# Patient Record
Sex: Female | Born: 1970
Health system: Southern US, Community
[De-identification: ages and names within clinical notes are randomized; demographics above are authoritative.]

## PROBLEM LIST (undated history)

## (undated) DIAGNOSIS — Z9012 Acquired absence of left breast and nipple: Secondary | ICD-10-CM

## (undated) DIAGNOSIS — R7303 Prediabetes: Secondary | ICD-10-CM

## (undated) DIAGNOSIS — R000 Tachycardia, unspecified: Secondary | ICD-10-CM

## (undated) DIAGNOSIS — B009 Herpesviral infection, unspecified: Secondary | ICD-10-CM

## (undated) DIAGNOSIS — D649 Anemia, unspecified: Secondary | ICD-10-CM

## (undated) DIAGNOSIS — G629 Polyneuropathy, unspecified: Secondary | ICD-10-CM

## (undated) DIAGNOSIS — C50912 Malignant neoplasm of unspecified site of left female breast: Secondary | ICD-10-CM

## (undated) DIAGNOSIS — J309 Allergic rhinitis, unspecified: Secondary | ICD-10-CM

## (undated) DIAGNOSIS — Z9221 Personal history of antineoplastic chemotherapy: Secondary | ICD-10-CM

## (undated) DIAGNOSIS — Z923 Personal history of irradiation: Secondary | ICD-10-CM

## (undated) DIAGNOSIS — R7301 Impaired fasting glucose: Secondary | ICD-10-CM

## (undated) DIAGNOSIS — E0590 Thyrotoxicosis, unspecified without thyrotoxic crisis or storm: Secondary | ICD-10-CM

## (undated) DIAGNOSIS — C50919 Malignant neoplasm of unspecified site of unspecified female breast: Secondary | ICD-10-CM

## (undated) DIAGNOSIS — D4959 Neoplasm of unspecified behavior of other genitourinary organ: Secondary | ICD-10-CM

## (undated) DIAGNOSIS — I10 Essential (primary) hypertension: Secondary | ICD-10-CM

## (undated) DIAGNOSIS — M179 Osteoarthritis of knee, unspecified: Secondary | ICD-10-CM

## (undated) DIAGNOSIS — N393 Stress incontinence (female) (male): Secondary | ICD-10-CM

## (undated) DIAGNOSIS — Z6841 Body Mass Index (BMI) 40.0 and over, adult: Secondary | ICD-10-CM

## (undated) DIAGNOSIS — I878 Other specified disorders of veins: Secondary | ICD-10-CM

## (undated) DIAGNOSIS — E559 Vitamin D deficiency, unspecified: Secondary | ICD-10-CM

## (undated) DIAGNOSIS — C773 Secondary and unspecified malignant neoplasm of axilla and upper limb lymph nodes: Secondary | ICD-10-CM

## (undated) DIAGNOSIS — R600 Localized edema: Secondary | ICD-10-CM

## (undated) DIAGNOSIS — K219 Gastro-esophageal reflux disease without esophagitis: Secondary | ICD-10-CM

## (undated) DIAGNOSIS — J302 Other seasonal allergic rhinitis: Secondary | ICD-10-CM

## (undated) DIAGNOSIS — E785 Hyperlipidemia, unspecified: Secondary | ICD-10-CM

## (undated) DIAGNOSIS — N952 Postmenopausal atrophic vaginitis: Secondary | ICD-10-CM

## (undated) DIAGNOSIS — R0789 Other chest pain: Secondary | ICD-10-CM

## (undated) DIAGNOSIS — L03115 Cellulitis of right lower limb: Secondary | ICD-10-CM

## (undated) DIAGNOSIS — R7309 Other abnormal glucose: Secondary | ICD-10-CM

## (undated) DIAGNOSIS — R232 Flushing: Secondary | ICD-10-CM

## (undated) DIAGNOSIS — K9041 Non-celiac gluten sensitivity: Secondary | ICD-10-CM

## (undated) HISTORY — DX: Essential (primary) hypertension: I10

## (undated) HISTORY — DX: Flushing: R23.2

## (undated) HISTORY — DX: Osteoarthritis of knee, unspecified: M17.9

## (undated) HISTORY — DX: Herpesviral infection, unspecified: B00.9

## (undated) HISTORY — DX: Postmenopausal atrophic vaginitis: N95.2

## (undated) HISTORY — DX: Body Mass Index (BMI) 40.0 and over, adult: Z684

## (undated) HISTORY — DX: Neoplasm of unspecified behavior of other genitourinary organ: D49.59

## (undated) HISTORY — DX: Hyperlipidemia, unspecified: E78.5

## (undated) HISTORY — DX: Allergic rhinitis, unspecified: J30.9

## (undated) HISTORY — DX: Thyrotoxicosis, unspecified without thyrotoxic crisis or storm: E05.90

## (undated) HISTORY — DX: Polyneuropathy, unspecified: G62.9

## (undated) HISTORY — DX: Other specified disorders of veins: I87.8

## (undated) HISTORY — DX: Morbid (severe) obesity due to excess calories: E66.01

## (undated) HISTORY — DX: Other chest pain: R07.89

## (undated) HISTORY — DX: Cellulitis of right lower limb: L03.115

## (undated) HISTORY — PX: REDUCTION MAMMAPLASTY: SUR839

## (undated) HISTORY — DX: Impaired fasting glucose: R73.01

## (undated) HISTORY — DX: Stress incontinence (female) (male): N39.3

## (undated) HISTORY — DX: Vitamin D deficiency, unspecified: E55.9

## (undated) HISTORY — DX: Localized edema: R60.0

## (undated) HISTORY — DX: Other abnormal glucose: R73.09

## (undated) HISTORY — DX: Malignant neoplasm of unspecified site of left female breast: C50.912

## (undated) HISTORY — DX: Acquired absence of left breast and nipple: Z90.12

## (undated) HISTORY — DX: Non-celiac gluten sensitivity: K90.41

## (undated) HISTORY — DX: Tachycardia, unspecified: R00.0

## (undated) HISTORY — DX: Gastro-esophageal reflux disease without esophagitis: K21.9

## (undated) HISTORY — DX: Secondary and unspecified malignant neoplasm of axilla and upper limb lymph nodes: C77.3

---
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
## 1997-08-30 ENCOUNTER — Ambulatory Visit (HOSPITAL_COMMUNITY): Admission: RE | Admit: 1997-08-30 | Discharge: 1997-08-30 | Payer: Self-pay | Admitting: Obstetrics and Gynecology

## 1997-10-11 ENCOUNTER — Other Ambulatory Visit: Admission: RE | Admit: 1997-10-11 | Discharge: 1997-10-11 | Payer: Self-pay | Admitting: Obstetrics & Gynecology

## 1998-03-19 ENCOUNTER — Other Ambulatory Visit: Admission: RE | Admit: 1998-03-19 | Discharge: 1998-03-19 | Payer: Self-pay | Admitting: Obstetrics & Gynecology

## 1998-09-26 ENCOUNTER — Other Ambulatory Visit: Admission: RE | Admit: 1998-09-26 | Discharge: 1998-09-26 | Payer: Self-pay | Admitting: *Deleted

## 1999-09-05 ENCOUNTER — Other Ambulatory Visit: Admission: RE | Admit: 1999-09-05 | Discharge: 1999-09-05 | Payer: Self-pay | Admitting: Obstetrics & Gynecology

## 1999-12-04 ENCOUNTER — Ambulatory Visit (HOSPITAL_COMMUNITY): Admission: RE | Admit: 1999-12-04 | Discharge: 1999-12-04 | Payer: Self-pay | Admitting: Internal Medicine

## 1999-12-04 ENCOUNTER — Encounter: Payer: Self-pay | Admitting: Internal Medicine

## 2000-09-28 ENCOUNTER — Emergency Department (HOSPITAL_COMMUNITY): Admission: EM | Admit: 2000-09-28 | Discharge: 2000-09-28 | Payer: Self-pay | Admitting: Emergency Medicine

## 2000-10-15 ENCOUNTER — Other Ambulatory Visit: Admission: RE | Admit: 2000-10-15 | Discharge: 2000-10-15 | Payer: Self-pay | Admitting: Obstetrics and Gynecology

## 2001-01-06 ENCOUNTER — Other Ambulatory Visit: Admission: RE | Admit: 2001-01-06 | Discharge: 2001-01-06 | Payer: Self-pay | Admitting: Obstetrics and Gynecology

## 2001-09-29 ENCOUNTER — Encounter: Payer: Self-pay | Admitting: Internal Medicine

## 2001-09-29 ENCOUNTER — Encounter: Admission: RE | Admit: 2001-09-29 | Discharge: 2001-09-29 | Payer: Self-pay | Admitting: Internal Medicine

## 2001-10-21 ENCOUNTER — Ambulatory Visit (HOSPITAL_COMMUNITY): Admission: RE | Admit: 2001-10-21 | Discharge: 2001-10-21 | Payer: Self-pay | Admitting: Podiatry

## 2001-10-21 ENCOUNTER — Encounter: Payer: Self-pay | Admitting: Podiatry

## 2002-02-10 ENCOUNTER — Other Ambulatory Visit: Admission: RE | Admit: 2002-02-10 | Discharge: 2002-02-10 | Payer: Self-pay | Admitting: Obstetrics and Gynecology

## 2002-04-07 ENCOUNTER — Ambulatory Visit (HOSPITAL_COMMUNITY): Admission: RE | Admit: 2002-04-07 | Discharge: 2002-04-07 | Payer: Self-pay | Admitting: Obstetrics and Gynecology

## 2002-04-07 ENCOUNTER — Encounter: Payer: Self-pay | Admitting: Obstetrics and Gynecology

## 2002-08-26 ENCOUNTER — Inpatient Hospital Stay (HOSPITAL_COMMUNITY): Admission: AD | Admit: 2002-08-26 | Discharge: 2002-08-26 | Payer: Self-pay | Admitting: Obstetrics and Gynecology

## 2002-08-30 ENCOUNTER — Encounter: Payer: Self-pay | Admitting: Obstetrics and Gynecology

## 2002-08-30 ENCOUNTER — Inpatient Hospital Stay (HOSPITAL_COMMUNITY): Admission: AD | Admit: 2002-08-30 | Discharge: 2002-08-30 | Payer: Self-pay | Admitting: Obstetrics and Gynecology

## 2002-08-30 ENCOUNTER — Observation Stay (HOSPITAL_COMMUNITY): Admission: AD | Admit: 2002-08-30 | Discharge: 2002-08-30 | Payer: Self-pay | Admitting: Obstetrics and Gynecology

## 2002-09-01 ENCOUNTER — Encounter (HOSPITAL_COMMUNITY): Admission: RE | Admit: 2002-09-01 | Discharge: 2002-09-01 | Payer: Self-pay | Admitting: Obstetrics and Gynecology

## 2002-09-09 ENCOUNTER — Inpatient Hospital Stay (HOSPITAL_COMMUNITY): Admission: AD | Admit: 2002-09-09 | Discharge: 2002-09-09 | Payer: Self-pay | Admitting: Obstetrics and Gynecology

## 2002-09-09 ENCOUNTER — Encounter: Payer: Self-pay | Admitting: Obstetrics and Gynecology

## 2002-09-11 ENCOUNTER — Encounter (INDEPENDENT_AMBULATORY_CARE_PROVIDER_SITE_OTHER): Payer: Self-pay | Admitting: Specialist

## 2002-09-11 ENCOUNTER — Inpatient Hospital Stay (HOSPITAL_COMMUNITY): Admission: AD | Admit: 2002-09-11 | Discharge: 2002-09-14 | Payer: Self-pay | Admitting: Obstetrics and Gynecology

## 2003-04-03 ENCOUNTER — Other Ambulatory Visit: Admission: RE | Admit: 2003-04-03 | Discharge: 2003-04-03 | Payer: Self-pay | Admitting: Obstetrics and Gynecology

## 2004-05-07 ENCOUNTER — Other Ambulatory Visit: Admission: RE | Admit: 2004-05-07 | Discharge: 2004-05-07 | Payer: Self-pay | Admitting: Obstetrics and Gynecology

## 2005-05-06 ENCOUNTER — Other Ambulatory Visit: Admission: RE | Admit: 2005-05-06 | Discharge: 2005-05-06 | Payer: Self-pay | Admitting: Obstetrics and Gynecology

## 2007-03-13 ENCOUNTER — Emergency Department (HOSPITAL_COMMUNITY): Admission: EM | Admit: 2007-03-13 | Discharge: 2007-03-13 | Payer: Self-pay | Admitting: Emergency Medicine

## 2009-07-12 ENCOUNTER — Encounter: Admission: RE | Admit: 2009-07-12 | Discharge: 2009-07-12 | Payer: Self-pay | Admitting: Internal Medicine

## 2010-06-09 DIAGNOSIS — J309 Allergic rhinitis, unspecified: Secondary | ICD-10-CM

## 2010-06-09 DIAGNOSIS — J302 Other seasonal allergic rhinitis: Secondary | ICD-10-CM

## 2010-06-09 HISTORY — DX: Allergic rhinitis, unspecified: J30.9

## 2010-06-09 HISTORY — DX: Other seasonal allergic rhinitis: J30.2

## 2010-07-12 LAB — HEPATITIS B SURFACE ANTIGEN: Hepatitis B Surface Ag: NEGATIVE

## 2010-07-12 LAB — TYPE AND SCREEN: Antibody Screen: NEGATIVE

## 2010-07-12 LAB — RUBELLA ANTIBODY, IGM: Rubella: IMMUNE

## 2010-10-25 NOTE — H&P (Signed)
NAME:  Mackenzie [name], Mackenzie [name]                       ACCOUNT NO.:  [id number]   MEDICAL RECORD NO.:  [id number]                   PATIENT TYPE:  INP   LOCATION:  9147                                 FACILITY:  [facility]   PHYSICIAN:  Naima A. Dillard, M.D.              DATE OF BIRTH:  1971/01/09   DATE OF ADMISSION:  09/11/2002  DATE OF DISCHARGE:                                HISTORY & PHYSICAL   HISTORY OF PRESENT ILLNESS:  The patient is a 40 year old, married, black  female, gravida 5, para 2-0-2-2, at 40-6/7 weeks by ultrasound, who presents  complaining of uterine contractions every three to five minutes for the last  several hours.  She denies any leaking or vaginal bleeding.  She reports  positive fetal movement.  She denies any nausea, vomiting, headache, or  visual disturbances.  Her pregnancy has been followed at [facility] by the certified nurse midwife service and has been essentially  uncomplicated, though at risk for:  1.  A history of questionable LMP.  2.  History of previous cesarean section with a subsequent vaginal birth after  cesarean section and an original interest in this delivery being a VBAC  also.  3.  History of obesity.  4.  Positive group B Streptococcus.  5.  One  elevated value on her three-hour GTT.  6.  History of abnormal PAP and  cryosurgery.  She last had a meal to eat around 3 p.m. and had a cookie at  11:30 p.m.   OBSTETRICAL/GYNECOLOGICAL HISTORY:  She is a gravida 5, para 2-0-2-2, with a  questionable LMP of around the end of June of 2003 and an [facility] established by  ultrasound of September 05, 2002.  She had an elective AB in 1991 with no  complications and also in 1993 with no complications.  In February of 1995,  she delivered a viable female infant weighing 7 pounds 12 ounces at [redacted] weeks  gestation.  She did have a C-section for that delivery secondary to  nonreassuring fetal heart rate after an attempted induction.  In September  of 1996,  she had another female infant that weighed 7 pounds 14 ounces at [redacted]  weeks gestation following a 17-hour labor.  She did deliver vaginally,  though required forceps for delivery.   ALLERGIES:  She has no known drug allergies.   PAST MEDICAL HISTORY:  She reports having the usual childhood diseases.  She  has a history of occasional urinary tract infections and a history of  gallstones.  Her only other hospitalizations were for childbirth.   PAST SURGICAL HISTORY:  Her only surgery was a C-section.   FAMILY HISTORY:  Significant for maternal grandfather with an MI, heart  disease, and chronic hypertension, maternal grandmother with insulin-  dependent diabetes and now deceased, paternal grandmother with breast  cancer, and maternal grandmother with a CVA.   GENETIC HISTORY:  Significant for  a daughter who is deaf in one ear of  unknown and the patient's cousin had twins.   SOCIAL HISTORY:  She is married to [facility], who is involved and  supportive.  He is employed full-time as an Biomedical scientist.  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. [employer].  They are  of the [employer] [facility] faith.  They deny any illicit drug use, alcohol, or smoking  with this pregnancy.   PRENATAL LABORATORY DATA:  Her blood type is O+.  Her antibody screen is  negative.  Sickle cell trait is negative.  Syphilis is nonreactive.  Rubella  is immune.  Hepatitis B surface antigen is negative.  HIV is nonreactive.  Her Pap is within normal limits.  Gonorrhea and chlamydia are both negative.  Her maternal serum alpha fetoprotein was within normal range.  Her one-hour  Glucola was elevated at 150 and her three-hour GTT was all normal with the  exception of one value.  She has had a normal growth ultrasound at 37 weeks.  Her 36-week beta strep is positive.  She also reports a normal biophysical  profile on September 09, 2002.   PHYSICAL EXAMINATION:  VITAL SIGNS:  Stable.  She is afebrile.   HEENT:  Grossly within normal limits.  HEART:  Regular rate and rhythm.  CHEST:  Clear.  BREASTS:  Soft and nontender.  ABDOMEN:  Gravid with uterine contractions originally every three to five  minutes.  Now after some IV fluid, they are every 4-10 minutes.  Her fetal  heart rate originally had a baseline of 155-165 with some late decelerations  noted with the uterine contractions, however, after IV fluid bolus, the late  decelerations have resolved and the fetal heart rate has reactivity and is  reassuring.  PELVIC:  Her cervix is closed, 70%, vertex out of the pelvis.  The vertex is  confirmed by bedside ultrasound.  EXTREMITIES:  Within normal limits.   ASSESSMENT:  1. Intrauterine pregnancy at 40-6/7 weeks.  2. History of previous low transverse cesarean section.  3. Unfavorable cervix.  4. Positive group B Streptococcus.   PLAN:  After a long discussion with the patient regarding her options of  observation, induction, and cesarean section and the risks and benefits of  each being reviewed, the patient has decided to proceed with a repeat  cesarean section.  Naima A. Dillard, M.D., has been notified of the  patient's status and decision and agrees to proceed.  It was scheduled to  performed a cesarean section around 6 a.m. today if the fetal heart rate  remains stable.     Concha Pyo. Duplantis, C.N.M.              Naima A. Normand Sloop, M.D.    SJD/MEDQ  D:  09/11/2002  T:  09/11/2002  Job:  [id number]

## 2010-10-25 NOTE — Op Note (Signed)
NAME:  Mackenzie [name], Mackenzie [name]                       ACCOUNT NO.:  [id number]   MEDICAL RECORD NO.:  [id number]                   PATIENT TYPE:  INP   LOCATION:  9147                                 [facility]ILITY:  [facility]   PHYSICIAN:  Naima A. Dillard, M.D.              DATE OF BIRTH:  1970/10/30   DATE OF PROCEDURE:  09/11/2002  DATE OF DISCHARGE:                                 OPERATIVE REPORT   PREOPERATIVE DIAGNOSES:  1. Pregnancy at 40-6/7 weeks with contractions.  2. Desires repeat cesarean section.   POSTOPERATIVE DIAGNOSES:  1. Pregnancy at 40-6/7 weeks with contractions.  2. Desires repeat cesarean section.  3. Macrosomia.   PROCEDURE:  Repeat cesarean section.   SURGEON:  Naima A. Normand Sloop, M.D.   ASSISTANT:  Concha Pyo. Duplantis, C.N.M.   ANESTHESIA:  Spinal.   ESTIMATED BLOOD LOSS:  800 mL.   INTRAVENOUS FLUIDS:  2000 mL crystalloid.   URINE OUTPUT:  400 mL.   COMPLICATIONS:  None.   FINDINGS:  Female infant in the vertex presentation with clear fluid.  Weight of 10 pounds, 15 ounces, Apgars are 9 and 9, born at 58.  Normal  uterus, tubes and ovaries bilaterally.   DESCRIPTION OF PROCEDURE:  The patient was taken to the operating room where  she was given spinal anesthesia, placed in the dorsal supine position with a  left lateral tilt, prepped and draped, Foley catheter placed.  Anesthesia  was found to be adequate and a Pfannenstiel skin incision was then made with  the scalpel. It was carried down to the fascia using Bovie cautery.  The  fascia was incised in the midline and extended bilaterally.  Kochers x2 were  used to elevate the fascia off of the rectus muscles both superiorly and  inferiorly and also with the help of the Bovie cautery.  The rectus muscles  were separated in the midline.  The peritoneum was identified, tented up  with sharply with Metzenbaum scissors.  The bladder blade was inserted.  The  vesicouterine peritoneum was identified, tented up  and entered sharply and  the bladder flap was created digitally.  The bladder blade was reinserted.  A lower transverse uterine incision was made with the scalpel and extended  using Mayo scissors.  The sac was ruptured with clear fluid.  The infant was  delivered using normal maneuvers.  The placenta was manually extracted.  There was a little bit of adherence but of the placental membranes to the  lower uterine segment.  Banjo curet was then used to curet the membranes.  A  dry towel was placed in the uterine cavity.  The uterus was cleared of all  clot and debris.  The uterine cervix was opened with my finger.  My glove  was then changed.  The uterine incisions were repaired with 0 Vicryl in a  running locked fashion.  A second layer of imbrication of 0 Vicryl  was used.  Irrigation was done.  There was still some bleeding seen on the left side of  the uterine incision which was repaired with 0 Vicryl in a running locked  fashion.  Hemostasis was assured.  Both tubes and ovaries were seen and  noted to be normal.  The uterus appeared to be normal.  The peritoneum was  closed with 0 chromic.  The fascia was closed with 0 Vicryl in a running  fashion.  JP drain was placed into the subcutaneous fascia.  The skin was  closed with staples.  Sponge, lap and needle counts were correct x2.  The  patient returned to the recovery room in stable condition.                                               Naima A. Normand Sloop, M.D.    NAD/MEDQ  D:  09/11/2002  T:  09/11/2002  Job:  [id number]

## 2010-10-25 NOTE — Discharge Summary (Signed)
   NAME:  Mackenzie [name], Mackenzie [name]                       ACCOUNT NO.:  [id number]   MEDICAL RECORD NO.:  [id number]                   PATIENT TYPE:  INP   LOCATION:  9147                                 [facility]   PHYSICIAN:  Naima A. Dillard, M.D.              DATE OF BIRTH:  [birth year]   DATE OF ADMISSION:  09/11/2002  DATE OF DISCHARGE:  09/14/2002                                 DISCHARGE SUMMARY   ADMISSION DIAGNOSES:  1. Intrauterine pregnancy at term.  2. Uterine contractions.  3. Desires repeat cesarean section.   PROCEDURE:  Repeat low-transverse cesarean section.   POSTOPERATIVE DIAGNOSES:  1. Intrauterine pregnancy at term.  2. Uterine contractions.  3. Desires repeat cesarean section.   HOSPITAL COURSE:  The patient is a 40 year old gravida 5, para 2-0-2-2 who  presents at 40-6/7 weeks with uterine contractions every 3 to 5 minutes.  Her cervix remained unfavorable, despite contractions.  She had a history of  a previous C-section with subsequent VBAC, although the VBAC required  forceps for delivery.  Then, at this time, on admission, she desired repeat  cesarean section.  She underwent this procedure by Dr. Jaymes Graff with  the birth of a 10-pound-15-ounce female infant named Mackenzie [name] with Apgar  scores at 9 at 1 minute, 9 at 5 minutes.  Both mother and infant have done  well in the immediate postoperative period.  Her hemoglobin on the first  postoperative day was 8.4.  She did not have any dizziness, and her vital  signs have remained stable.  She is judged to be in satisfactory condition  on this postop day three.   DISCHARGE INSTRUCTIONS:  Per [facility] doctor.   DISCHARGE MEDICATIONS:  1. Motrin 600 mg p.o. q.6h. p.r.n. pain.  2. Tylox, number 2, p.o. q.3-4h. pain.  3. Prenatal vitamins.   DISCHARGE FOLLOW UP:  Will be at [facility] in six weeks.       Rica Koyanagi, C.N.M.               Naima A. Normand Sloop, M.D.    SDM/MEDQ  D:   09/14/2002  T:  09/14/2002  Job:  [id number]

## 2010-12-04 ENCOUNTER — Encounter: Payer: Self-pay | Attending: Obstetrics and Gynecology

## 2010-12-04 DIAGNOSIS — Z713 Dietary counseling and surveillance: Secondary | ICD-10-CM | POA: Insufficient documentation

## 2010-12-04 DIAGNOSIS — O99810 Abnormal glucose complicating pregnancy: Secondary | ICD-10-CM | POA: Insufficient documentation

## 2011-01-06 ENCOUNTER — Other Ambulatory Visit: Payer: Self-pay | Admitting: Obstetrics and Gynecology

## 2011-01-07 ENCOUNTER — Other Ambulatory Visit: Payer: Self-pay | Admitting: Obstetrics and Gynecology

## 2011-01-14 ENCOUNTER — Encounter (HOSPITAL_COMMUNITY): Payer: Self-pay

## 2011-01-15 ENCOUNTER — Encounter (HOSPITAL_COMMUNITY): Payer: Self-pay

## 2011-01-15 ENCOUNTER — Encounter (HOSPITAL_COMMUNITY)
Admission: RE | Admit: 2011-01-15 | Discharge: 2011-01-15 | Disposition: A | Discharge status: Home / Self Care | Payer: 59 | Source: Ambulatory Visit | Attending: Obstetrics and Gynecology | Admitting: Obstetrics and Gynecology

## 2011-01-15 ENCOUNTER — Encounter (HOSPITAL_COMMUNITY): Payer: Self-pay | Admitting: Obstetrics and Gynecology

## 2011-01-15 HISTORY — DX: Anemia, unspecified: D64.9

## 2011-01-15 LAB — BASIC METABOLIC PANEL
Glucose, Bld: 107 mg/dL — ABNORMAL HIGH (ref 70–99)
Potassium: 3.8 mEq/L (ref 3.5–5.1)

## 2011-01-15 LAB — RPR: RPR Ser Ql: NONREACTIVE

## 2011-01-15 LAB — CBC
Hemoglobin: 11.2 g/dL — ABNORMAL LOW (ref 12.0–15.0)
MCH: 28.4 pg (ref 26.0–34.0)
MCV: 85.6 fL (ref 78.0–100.0)
Platelets: 221 10*3/uL (ref 150–400)
RDW: 13.8 % (ref 11.5–15.5)

## 2011-01-15 NOTE — Patient Instructions (Addendum)
20 Amie C Dahm  01/15/2011   Your procedure is scheduled on:  01/21/11  Report to [facility] at 9 AM.  Call this number if you have problems the morning of surgery: [phone]   Remember:   Do not eat food:After Midnight.  Do not drink clear liquids: 4 Hours before arrival.  Take these medicines the morning of surgery with A SIP OF WATER: NA   Do not wear jewelry, make-up or nail polish.  Do not wear lotions, powders, or perfumes. You may wear deodorant.  Do not shave 48 hours prior to surgery.  Do not bring valuables to the hospital.  Contacts, dentures or bridgework may not be worn into surgery.  Leave suitcase in the car. After surgery it may be brought to your room.  For patients admitted to the hospital, checkout time is 11:00 AM the day of discharge.   Patients discharged the day of surgery will not be allowed to drive home.  Name and phone number of your driver: NA  Special Instructions: Use CHG wash per written instruction sheet   Please read over the following fact sheets that you were given: MRSA Information

## 2011-01-20 MED ORDER — CEFAZOLIN SODIUM-DEXTROSE 2-3 GM-% IV SOLR
2.0000 g | INTRAVENOUS | Status: DC
  Filled 2011-01-20: qty 50

## 2011-01-20 NOTE — H&P (Signed)
NAMESHANAI, LARTIGUE             ACCOUNT NO.:  [id number]  MEDICAL RECORD NO.:  [id number]  LOCATION:                                 FACILITY:  PHYSICIAN:  Naima A. Dillard, M.D. DATE OF BIRTH:  May 18, 1971  DATE OF ADMISSION:  01/21/2011 DATE OF DISCHARGE:                             HISTORY & PHYSICAL   Ms. Malecki is a 40 year old gravida 6, para 3-0-2-3 who presents at 73- 4/7 weeks for scheduled repeat cesarean section with bilateral tubal ligation.  Her pregnancy has been remarkable for; 1. Previous C-section x2 with desire for repeat and tubal. 2. History of LGA infant. 3. Gestational diabetes, insulin-dependent. 4. Elevated BMI. 5. Group B strep positive. 6. Advanced maternal age.  PRENATAL LABS:  Blood type is O+, Rh antibody negative, VDRL nonreactive, rubella titer positive, hepatitis B surface antigen negative, HIV was nonreactive.  GC and Chlamydia cultures were negative in the first trimester.  Pap was normal in February 2012.  Urinalysis was also normal at that time.  Hemoglobin upon entering the practice was 12.1.  It was 10.6 at 29 weeks.  The patient had a first trimester screen that was normal.  She also had an AFP that was normal.  She had a 1-hour Glucola at 17 weeks of 120.  She then had another Glucola at 29 weeks that was 152.  She also at that time had nonreactive RPR and hemoglobin of 10.6.  She had a 3-hour GTT showing 3/4 abnormal values. Beta strep was positive at 36 weeks.  HISTORY OF PRESENT PREGNANCY:  The patient entered care at approximately 10 weeks.  She had a first trimester screen and a normal AFP.  She was planning a repeat C-section from the beginning of her pregnancy.  She had some dependent edema by 14 weeks.  AFP was normal.  Anatomy scan was done at 20 weeks which adjusted her EDC to January 24, 2011, this also was in agreement with her 11-week ultrasound.  She had an elevated 1- hour Glucola at 29 weeks and 3 out of 4 of normal  values on her 3-hour. She therefore was sent to nutritional management.  Soon after that her sugars by 34 weeks were noted to be elevated.  She was started on Humulin at that time nightly 8 units.  She continued to monitor her blood sugar and by 35 weeks added 4 units of NPH in the morning.  She also had implementation of weekly biophysical profile.  Beta strep was positive at 36 weeks.  She had an ultrasound at 35 weeks which showed growth at the 83rd percentile and fluid at 75th percentile.  She then had weekly BPPs that were normal by her visit on August 9.  Fasting blood sugars had been in the 100-106, 2-hour postprandial have been 89- 129.  She had another ultrasound done at 38-6/7 weeks showing normal fluid, BPP of 8/8 in a vertex presentation.  She then was scheduled for C-section on January 21, 2011, with tubal ligation by Dr. Normand Sloop.  OBSTETRICAL HISTORY:  In 46 and again in 1992 or 1993, she had a TAB. In 1995, she had a primary low transverse cesarean section for a female infant,  weight 7 pounds 12 ounces at 42 weeks.  That C-section was done for an attempted induction for oligohydramnios that then showed low fetal heart rate.  She essentially did not labor much at all and was taken for C-section.  She then in 1996 had a forceps delivery of a female infant, weight 7 pounds 4 ounces at 40 weeks.  She was in labor 17 hours.  This was a failed vacuum and then a forceps delivery.  She then in 2004 had a repeat C-section for a female infant, weight 10 pounds 11 ounces at 42 weeks without complication.  In addition to that pregnancy remarkable for the patient is also an Charity fundraiser.  GYNECOLOGICAL HISTORY:  The patient denies any STDs.  She has had normal cycles.  MEDICAL HISTORY:  She reports usual childhood illnesses.  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  ALLERGIES:  She has no known medication allergies.  FAMILY HISTORY:  Maternal grandfather is hypertensive.   Maternal grandmother has insulin-dependent diabetes.  Her mother had a benign brain tumor.  Her paternal grandmother had breast cancer.  Her only surgery was the C-sections in 1995 and 2004.  Genetic history is remarkable for the third child having extra digits on the hand.  SOCIAL HISTORY:  The patient is married to the father of baby.  He is involved and supportive.  His name is Naylah Cork.  The patient is a Designer, jewellery at [employer].  Her husband also has some college. He is employed in an Nurse, learning disability.  She denies religious affiliation.  She has been followed by [facility].  She denies any alcohol, drug or tobacco use during this pregnancy.  PHYSICAL EXAMINATION:  VITAL SIGNS:  Stable.  The patient is febrile. HEENT:  Within normal limits. LUNGS:  Breath sounds are clear. HEART:  Regular rate and rhythm without murmur. BREASTS:  Soft and nontender. ABDOMEN:  Fundal height is approximately 41-42 cm.  Estimated fetal weight is 8 to 8-1/2 pounds. PELVIC:  Uterine contractions are none.  Fetal heart rate has been in the 140s by Doppler in the office. EXTREMITIES:  Deep tendon reflexes are 2+ without clonus.  There is +1 edema noted. PELVIC:  Deferred.  IMPRESSION: 1. Intrauterine pregnancy at 39-4/7 weeks. 2. Previous cesarean section x2 with desire for repeat. 3. Desires tubal sterilization. 4. Gestational diabetes, insulin-dependent. 5. Group B strep positive. 6. History of large for gestational age infant.  PLAN: 1. Admit to [facility] [place] per consult with Dr. Jaymes Graff as attending physician. 2. Routine physician preoperative orders. 3. Dr. Normand Sloop will make plans for blood sugar monitoring as     indicated.    Renaldo Reel Emilee Hero, C.N.M.   ______________________________ Pierre Bali Normand Sloop, M.D.   Leeanne Mannan  D:  01/20/2011  T:  01/20/2011  Job:  [id number]

## 2011-01-21 ENCOUNTER — Encounter (HOSPITAL_COMMUNITY): Payer: Self-pay | Admitting: Anesthesiology

## 2011-01-21 ENCOUNTER — Other Ambulatory Visit: Payer: Self-pay | Admitting: Obstetrics and Gynecology

## 2011-01-21 ENCOUNTER — Encounter (HOSPITAL_COMMUNITY): Payer: Self-pay | Admitting: Surgery

## 2011-01-21 ENCOUNTER — Inpatient Hospital Stay (HOSPITAL_COMMUNITY)
Admission: RE | Admit: 2011-01-21 | Discharge: 2011-01-24 | DRG: 766 | Disposition: A | Discharge status: Home / Self Care | Payer: 59 | Source: Ambulatory Visit | Attending: Obstetrics and Gynecology | Admitting: Obstetrics and Gynecology

## 2011-01-21 ENCOUNTER — Inpatient Hospital Stay (HOSPITAL_COMMUNITY): Payer: 59

## 2011-01-21 ENCOUNTER — Inpatient Hospital Stay (HOSPITAL_COMMUNITY): Payer: 59 | Admitting: Anesthesiology

## 2011-01-21 ENCOUNTER — Encounter (HOSPITAL_COMMUNITY)
Admission: RE | Disposition: A | Discharge status: Home / Self Care | Payer: Self-pay | Source: Ambulatory Visit | Attending: Obstetrics and Gynecology

## 2011-01-21 DIAGNOSIS — IMO0002 Reserved for concepts with insufficient information to code with codable children: Secondary | ICD-10-CM

## 2011-01-21 DIAGNOSIS — Z01818 Encounter for other preprocedural examination: Secondary | ICD-10-CM

## 2011-01-21 DIAGNOSIS — O34219 Maternal care for unspecified type scar from previous cesarean delivery: Principal | ICD-10-CM | POA: Diagnosis present

## 2011-01-21 DIAGNOSIS — Z01812 Encounter for preprocedural laboratory examination: Secondary | ICD-10-CM

## 2011-01-21 DIAGNOSIS — Z302 Encounter for sterilization: Secondary | ICD-10-CM

## 2011-01-21 DIAGNOSIS — O99814 Abnormal glucose complicating childbirth: Secondary | ICD-10-CM | POA: Diagnosis present

## 2011-01-21 HISTORY — PX: TUBAL LIGATION: SHX77

## 2011-01-21 LAB — GLUCOSE, CAPILLARY
Glucose-Capillary: 92 mg/dL (ref 70–99)
Glucose-Capillary: 70 mg/dL (ref 70–99)

## 2011-01-21 LAB — TYPE AND SCREEN: Antibody Screen: NEGATIVE

## 2011-01-21 SURGERY — Surgical Case
Anesthesia: Spinal | Site: Abdomen | Laterality: Bilateral | Wound class: Clean Contaminated

## 2011-01-21 MED ORDER — NALOXONE HCL 0.4 MG/ML IJ SOLN: 0.4000 mg | INTRAMUSCULAR | Status: DC | PRN

## 2011-01-21 MED ORDER — BUPIVACAINE HCL 0.75 % IJ SOLN
INTRAMUSCULAR | Status: DC | PRN
  Administered 2011-01-21: 11 mg via INTRATHECAL

## 2011-01-21 MED ORDER — MEPERIDINE HCL 25 MG/ML IJ SOLN: 6.2500 mg | INTRAMUSCULAR | Status: DC | PRN

## 2011-01-21 MED ORDER — DIPHENHYDRAMINE HCL 25 MG PO CAPS
25.0000 mg | ORAL_CAPSULE | ORAL | Status: DC | PRN
  Administered 2011-01-21 – 2011-01-22 (×4): 25 mg via ORAL
  Filled 2011-01-21 (×2): qty 1

## 2011-01-21 MED ORDER — METOCLOPRAMIDE HCL 5 MG/ML IJ SOLN: 10.0000 mg | Freq: Three times a day (TID) | INTRAMUSCULAR | Status: DC | PRN

## 2011-01-21 MED ORDER — NALBUPHINE HCL 10 MG/ML IJ SOLN
5.0000 mg | INTRAMUSCULAR | Status: DC | PRN
  Filled 2011-01-21 (×2): qty 1

## 2011-01-21 MED ORDER — FLEET ENEMA 7-19 GM/118ML RE ENEM: 1.0000 | ENEMA | RECTAL | Status: DC | PRN

## 2011-01-21 MED ORDER — MEASLES, MUMPS & RUBELLA VAC ~~LOC~~ INJ: 0.5000 mL | INJECTION | Freq: Once | SUBCUTANEOUS | Status: DC

## 2011-01-21 MED ORDER — KETOROLAC TROMETHAMINE 30 MG/ML IJ SOLN: 30.0000 mg | Freq: Four times a day (QID) | INTRAMUSCULAR | Status: AC | PRN

## 2011-01-21 MED ORDER — SODIUM CHLORIDE 0.9 % IJ SOLN: 3.0000 mL | INTRAMUSCULAR | Status: DC | PRN

## 2011-01-21 MED ORDER — LACTATED RINGERS IV SOLN
INTRAVENOUS | Status: DC
  Administered 2011-01-21 (×4): via INTRAVENOUS

## 2011-01-21 MED ORDER — DIPHENHYDRAMINE HCL 50 MG/ML IJ SOLN: 25.0000 mg | INTRAMUSCULAR | Status: DC | PRN

## 2011-01-21 MED ORDER — OXYTOCIN 10 UNIT/ML IJ SOLN: 10.0000 [IU] | INTRAVENOUS | Status: DC | PRN

## 2011-01-21 MED ORDER — PRENATAL PLUS 27-1 MG PO TABS
1.0000 | ORAL_TABLET | Freq: Every day | ORAL | Status: DC
  Administered 2011-01-22 – 2011-01-24 (×4): 1 via ORAL
  Filled 2011-01-21 (×3): qty 1

## 2011-01-21 MED ORDER — ONDANSETRON HCL 4 MG/2ML IJ SOLN
INTRAMUSCULAR | Status: DC | PRN
  Administered 2011-01-21: 4 mg via INTRAVENOUS

## 2011-01-21 MED ORDER — BISACODYL 10 MG RE SUPP: 10.0000 mg | Freq: Every day | RECTAL | Status: DC | PRN

## 2011-01-21 MED ORDER — ZOLPIDEM TARTRATE 5 MG PO TABS: 5.0000 mg | ORAL_TABLET | Freq: Every evening | ORAL | Status: DC | PRN

## 2011-01-21 MED ORDER — DIBUCAINE 1 % RE OINT: 1.0000 "application " | TOPICAL_OINTMENT | RECTAL | Status: DC | PRN

## 2011-01-21 MED ORDER — KETOROLAC TROMETHAMINE 60 MG/2ML IM SOLN
60.0000 mg | Freq: Once | INTRAMUSCULAR | Status: AC | PRN
  Administered 2011-01-21: 60 mg via INTRAMUSCULAR

## 2011-01-21 MED ORDER — KETOROLAC TROMETHAMINE 60 MG/2ML IM SOLN
INTRAMUSCULAR | Status: AC
  Filled 2011-01-21: qty 2

## 2011-01-21 MED ORDER — IBUPROFEN 600 MG PO TABS
600.0000 mg | ORAL_TABLET | Freq: Four times a day (QID) | ORAL | Status: DC | PRN
  Filled 2011-01-21 (×7): qty 1

## 2011-01-21 MED ORDER — ONDANSETRON HCL 4 MG PO TABS: 4.0000 mg | ORAL_TABLET | ORAL | Status: DC | PRN

## 2011-01-21 MED ORDER — SCOPOLAMINE 1 MG/3DAYS TD PT72
1.0000 | MEDICATED_PATCH | Freq: Once | TRANSDERMAL | Status: DC
  Administered 2011-01-21: 1.5 mg via TRANSDERMAL

## 2011-01-21 MED ORDER — DIPHENHYDRAMINE HCL 50 MG/ML IJ SOLN: 12.5000 mg | INTRAMUSCULAR | Status: DC | PRN

## 2011-01-21 MED ORDER — SIMETHICONE 80 MG PO CHEW
80.0000 mg | CHEWABLE_TABLET | Freq: Three times a day (TID) | ORAL | Status: DC
  Administered 2011-01-21 – 2011-01-24 (×11): 80 mg via ORAL

## 2011-01-21 MED ORDER — FENTANYL CITRATE 0.05 MG/ML IJ SOLN
INTRAMUSCULAR | Status: DC | PRN
  Administered 2011-01-21: 25 ug via INTRAVENOUS

## 2011-01-21 MED ORDER — OXYCODONE-ACETAMINOPHEN 5-325 MG PO TABS
1.0000 | ORAL_TABLET | ORAL | Status: DC | PRN
  Administered 2011-01-22: 2 via ORAL
  Administered 2011-01-22: 1 via ORAL
  Administered 2011-01-22 – 2011-01-24 (×5): 2 via ORAL
  Filled 2011-01-21 (×3): qty 2
  Filled 2011-01-21: qty 1
  Filled 2011-01-21: qty 2
  Filled 2011-01-21: qty 1
  Filled 2011-01-21: qty 2

## 2011-01-21 MED ORDER — SENNOSIDES-DOCUSATE SODIUM 8.6-50 MG PO TABS
2.0000 | ORAL_TABLET | Freq: Every day | ORAL | Status: DC
  Administered 2011-01-21 – 2011-01-23 (×3): 2 via ORAL

## 2011-01-21 MED ORDER — INSULIN ASPART 100 UNIT/ML ~~LOC~~ SOLN
4.0000 [IU] | SUBCUTANEOUS | Status: DC | PRN
  Filled 2011-01-21: qty 3

## 2011-01-21 MED ORDER — LACTATED RINGERS IV SOLN
INTRAVENOUS | Status: DC
  Administered 2011-01-21: 22:00:00 via INTRAVENOUS

## 2011-01-21 MED ORDER — FERROUS SULFATE 325 (65 FE) MG PO TABS
325.0000 mg | ORAL_TABLET | Freq: Every day | ORAL | Status: DC
  Administered 2011-01-22 – 2011-01-24 (×4): 325 mg via ORAL
  Filled 2011-01-21 (×3): qty 1

## 2011-01-21 MED ORDER — LORATADINE 10 MG PO TABS
10.0000 mg | ORAL_TABLET | Freq: Every day | ORAL | Status: DC
  Administered 2011-01-22 – 2011-01-24 (×4): 10 mg via ORAL
  Filled 2011-01-21 (×5): qty 1

## 2011-01-21 MED ORDER — ONDANSETRON HCL 4 MG/2ML IJ SOLN: 4.0000 mg | INTRAMUSCULAR | Status: DC | PRN

## 2011-01-21 MED ORDER — WITCH HAZEL-GLYCERIN EX PADS: 1.0000 "application " | MEDICATED_PAD | CUTANEOUS | Status: DC | PRN

## 2011-01-21 MED ORDER — FENTANYL CITRATE 0.05 MG/ML IJ SOLN: 25.0000 ug | INTRAMUSCULAR | Status: DC | PRN

## 2011-01-21 MED ORDER — LANOLIN HYDROUS EX OINT: 1.0000 "application " | TOPICAL_OINTMENT | CUTANEOUS | Status: DC | PRN

## 2011-01-21 MED ORDER — OXYTOCIN 20 UNITS IN LACTATED RINGERS INFUSION - SIMPLE
INTRAVENOUS | Status: DC | PRN
  Administered 2011-01-21: 20 [IU] via INTRAVENOUS
  Administered 2011-01-21: 10 [IU] via INTRAVENOUS

## 2011-01-21 MED ORDER — SODIUM CHLORIDE 0.9 % IV SOLN
1.0000 ug/kg/h | INTRAVENOUS | Status: DC | PRN
  Filled 2011-01-21: qty 2.5

## 2011-01-21 MED ORDER — TETANUS-DIPHTH-ACELL PERTUSSIS 5-2.5-18.5 LF-MCG/0.5 IM SUSP: 0.5000 mL | Freq: Once | INTRAMUSCULAR | Status: DC

## 2011-01-21 MED ORDER — IBUPROFEN 600 MG PO TABS
600.0000 mg | ORAL_TABLET | Freq: Four times a day (QID) | ORAL | Status: DC
  Administered 2011-01-22 – 2011-01-24 (×11): 600 mg via ORAL
  Filled 2011-01-21 (×3): qty 1

## 2011-01-21 MED ORDER — NALBUPHINE HCL 10 MG/ML IJ SOLN
5.0000 mg | INTRAMUSCULAR | Status: DC | PRN
  Administered 2011-01-21: 5 mg via INTRAVENOUS
  Filled 2011-01-21: qty 1

## 2011-01-21 MED ORDER — DIPHENHYDRAMINE HCL 25 MG PO CAPS
25.0000 mg | ORAL_CAPSULE | Freq: Four times a day (QID) | ORAL | Status: DC | PRN
  Filled 2011-01-21 (×2): qty 1

## 2011-01-21 MED ORDER — ONDANSETRON HCL 4 MG/2ML IJ SOLN: 4.0000 mg | Freq: Once | INTRAMUSCULAR | Status: DC | PRN

## 2011-01-21 MED ORDER — MONTELUKAST SODIUM 10 MG PO TABS
10.0000 mg | ORAL_TABLET | Freq: Every day | ORAL | Status: DC
  Administered 2011-01-21 – 2011-01-23 (×3): 10 mg via ORAL
  Filled 2011-01-21 (×4): qty 1

## 2011-01-21 MED ORDER — MORPHINE SULFATE (PF) 0.5 MG/ML IJ SOLN
INTRAMUSCULAR | Status: DC | PRN
  Administered 2011-01-21: 100 ug via EPIDURAL

## 2011-01-21 MED ORDER — ONDANSETRON HCL 4 MG/2ML IJ SOLN: 4.0000 mg | Freq: Three times a day (TID) | INTRAMUSCULAR | Status: DC | PRN

## 2011-01-21 MED ORDER — CEFAZOLIN SODIUM 1-5 GM-% IV SOLN
INTRAVENOUS | Status: DC | PRN
  Administered 2011-01-21: 2 g via INTRAVENOUS

## 2011-01-21 MED ORDER — SIMETHICONE 80 MG PO CHEW
80.0000 mg | CHEWABLE_TABLET | ORAL | Status: DC | PRN
  Administered 2011-01-23: 80 mg via ORAL

## 2011-01-21 MED ORDER — MENTHOL 3 MG MT LOZG
1.0000 | LOZENGE | OROMUCOSAL | Status: DC | PRN
  Administered 2011-01-21 – 2011-01-23 (×3): 3 mg via ORAL
  Filled 2011-01-21 (×3): qty 9

## 2011-01-21 SURGICAL SUPPLY — 37 items
APL SKNCLS STERI-STRIP NONHPOA (GAUZE/BANDAGES/DRESSINGS) ×1
BENZOIN TINCTURE PRP APPL 2/3 (GAUZE/BANDAGES/DRESSINGS) ×2 IMPLANT
CLOTH BEACON ORANGE TIMEOUT ST (SAFETY) ×2 IMPLANT
CONTAINER PREFILL 10% NBF 15ML (MISCELLANEOUS) ×2 IMPLANT
DRAIN JACKSON PRT FLT 10 (DRAIN) ×1 IMPLANT
DRESSING TELFA 8X3 (GAUZE/BANDAGES/DRESSINGS) ×1 IMPLANT
ELECT REM PT RETURN 9FT ADLT (ELECTROSURGICAL) ×2
ELECTRODE REM PT RTRN 9FT ADLT (ELECTROSURGICAL) ×1 IMPLANT
EVACUATOR SILICONE 100CC (DRAIN) ×1 IMPLANT
EXTRACTOR VACUUM M CUP 4 TUBE (SUCTIONS) IMPLANT
GLOVE BIO SURGEON STRL SZ 6.5 (GLOVE) ×2 IMPLANT
GLOVE BIOGEL PI IND STRL 7.0 (GLOVE) ×1 IMPLANT
GLOVE BIOGEL PI INDICATOR 7.0 (GLOVE) ×1
GOWN PREVENTION PLUS LG XLONG (DISPOSABLE) ×6 IMPLANT
KIT ABG SYR 3ML LUER SLIP (SYRINGE) IMPLANT
NDL HYPO 25X5/8 SAFETYGLIDE (NEEDLE) IMPLANT
NEEDLE HYPO 25X5/8 SAFETYGLIDE (NEEDLE) IMPLANT
NS IRRIG 1000ML POUR BTL (IV SOLUTION) ×2 IMPLANT
PACK C SECTION WH (CUSTOM PROCEDURE TRAY) ×2 IMPLANT
PAD ABD 5X9 TENDERSORB (GAUZE/BANDAGES/DRESSINGS) ×1 IMPLANT
RTRCTR C-SECT PINK 25CM LRG (MISCELLANEOUS) ×1 IMPLANT
SLEEVE SCD COMPRESS KNEE MED (MISCELLANEOUS) IMPLANT
SPONGE GAUZE 4X4 12PLY (GAUZE/BANDAGES/DRESSINGS) ×1 IMPLANT
STAPLER VISISTAT 35W (STAPLE) IMPLANT
STRIP CLOSURE SKIN 1/2X4 (GAUZE/BANDAGES/DRESSINGS) ×2 IMPLANT
SUT CHROMIC 0 CT 1 (SUTURE) ×2 IMPLANT
SUT MNCRL AB 3-0 PS2 27 (SUTURE) IMPLANT
SUT PLAIN 2 0 (SUTURE) ×4
SUT PLAIN 2 0 XLH (SUTURE) ×2 IMPLANT
SUT PLAIN ABS 2-0 CT1 27XMFL (SUTURE) ×2 IMPLANT
SUT SILK 2 0 SH (SUTURE) ×2 IMPLANT
SUT VIC AB 0 CTX 36 (SUTURE) ×8
SUT VIC AB 0 CTX36XBRD ANBCTRL (SUTURE) ×4 IMPLANT
TAPE CLOTH SURG 4X10 WHT LF (GAUZE/BANDAGES/DRESSINGS) ×1 IMPLANT
TOWEL OR 17X24 6PK STRL BLUE (TOWEL DISPOSABLE) ×4 IMPLANT
TRAY FOLEY CATH 14FR (SET/KITS/TRAYS/PACK) ×2 IMPLANT
WATER STERILE IRR 1000ML POUR (IV SOLUTION) ×2 IMPLANT

## 2011-01-21 NOTE — Transfer of Care (Signed)
Immediate Anesthesia Transfer of Care Note  Patient: Mackenzie [name]  Procedure(s) Performed:  CESAREAN SECTION WITH BILATERAL TUBAL LIGATION - Repeat  Patient Location: PACU  Anesthesia Type: Spinal  Level of Consciousness: awake, alert  and oriented  Airway & Oxygen Therapy: Patient Spontanous Breathing  Post-op Assessment: Report given to PACU RN  Post vital signs: Reviewed and stable  Complications: No apparent anesthesia complications

## 2011-01-21 NOTE — Anesthesia Preprocedure Evaluation (Signed)
Anesthesia Evaluation  Name, MR# and DOB Patient awake  General Assessment Comment  Reviewed: Allergy & Precautions, H&P , NPO status , Patient's Chart, lab work & pertinent test results  Airway Mallampati: III TM Distance: >3 FB Neck ROM: full    Dental No notable dental hx.    Pulmonary    pulmonary exam normalPulmonary Exam Normal     Cardiovascular     Neuro/Psych Negative Neurological ROS  Negative Psych ROS  GI/Hepatic/Renal negative GI ROS, negative Liver ROS, and negative Renal ROS (+)       Endo/Other  Negative Endocrine ROS (+) Well Controlled, Gestational,     Abdominal   Musculoskeletal   Hematology negative hematology ROS (+)   Peds  Reproductive/Obstetrics negative OB ROS (+) Pregnancy    Anesthesia Other Findings             Anesthesia Physical Anesthesia Plan  ASA: III  Anesthesia Plan: Spinal   Post-op Pain Management:    Induction:   Airway Management Planned:   Additional Equipment:   Intra-op Plan:   Post-operative Plan:   Informed Consent: I have reviewed the patients History and Physical, chart, labs and discussed the procedure including the risks, benefits and alternatives for the proposed anesthesia with the patient or authorized representative who has indicated his/her understanding and acceptance.     Plan Discussed with: Anesthesiologist  Anesthesia Plan Comments:         Anesthesia Quick Evaluation

## 2011-01-21 NOTE — Anesthesia Postprocedure Evaluation (Signed)
  Anesthesia Post-op Note  Patient: Mackenzie [name]  Procedure(s) Performed:  CESAREAN SECTION WITH BILATERAL TUBAL LIGATION - Repeat  Patient Location: PACU and Mother/Baby  Anesthesia Type: Spinal  Level of Consciousness: awake, alert , oriented and patient cooperative  Airway and Oxygen Therapy: Patient Spontanous Breathing  Post-op Pain: mild  Post-op Assessment: Patient's Cardiovascular Status Stable  Post-op Vital Signs: stable  Complications: No apparent anesthesia complications

## 2011-01-21 NOTE — Op Note (Signed)
Cesarean Section Procedure Note   Mackenzie [name]  01/21/2011  Indications: Scheduled Proceedure/Maternal Request   Pre-operative Diagnosis: Previous Cesarean Section times two.   Post-operative Diagnosis: Same   Surgeon: Surgeon(s) and Role:    * Michael Litter, MD - Primary   Assistants: Manfred Arch CNM  Anesthesia: spinal   Procedure Details:  The patient was seen in the Holding Room. The risks, benefits, complications, treatment options, and expected outcomes were discussed with the patient. The patient concurred with the proposed plan, giving informed consent. identified as Mackenzie [name] and the procedure verified as C-Section Delivery. A Time Out was held and the above information confirmed.  After induction of anesthesia, the patient was draped and prepped in the usual sterile manner. A transverse was made and carried down through the subcutaneous tissue to the fascia. Fascial incision was made and extended transversely. The fascia was separated from the underlying rectus tissue superiorly and inferiorly. The peritoneum was identified and entered. Peritoneal incision was extended longitudinally with good visualization of bowel and bladder.The alexis retractor was used.   The utero-vesical peritoneal reflection was incised transversely and the bladder flap was bluntly freed from the lower uterine segment. A low transverse uterine incision was made. Delivered from cephalic presentation was a 9-9 pound infanr Female with Apgar scores of 9 at one minute and 9 at five minutes. Cord ph was not sent the umbilical cord was clamped and cut cord blood was obtained for evaluation. The placenta was removed intact however a succuriate lobe was present and appeared normal. The uterine outline, tubes and ovaries appeared normal}. The uterine incision was closed with running locked sutures of 0Vicryl. A second layer 0 vicrlyl was used to imbricate the uterine incision    Hemostasis was observed.  Lavage was carried out until clear.The patients left fallopian tube was grasped with babcock clamp and a centimeter of the mid-isthmic portion of the tube was ligated with 2-0 plain and excised.  Hemostasis was assured.  The right fallopian tube was ligated in a similar fashion. A fish was placed and the peritoneum was closed.  The fish was removed at final cloure of the peritoneum    The fascia was then reapproximated with running sutures of 3-0 vicryl. A ten JP drain was placed in the subcuticular space.   The subcuticular closure was performed using 3-0plain. The skin was closed with 3-3monocryl.   Instrument, sponge, and needle counts were not  correct prior the abdominal closure and abdominal x ray was negative.    Findings:See above   Estimated Blood Loss: 600cc  Total IV Fluids:   Urine Output: 300CC OF clear urine  Specimens:  Specimens    None       Complications: no complications  Disposition: PACU - hemodynamically stable.   Maternal Condition: stable   Baby condition / location:  nursery-stable  Attending Attestation: I was present and scrubbed for the entire procedure.   Signed: Surgeon(s): Michael Litter, MD

## 2011-01-21 NOTE — Anesthesia Procedure Notes (Addendum)
Spinal Block  Patient location during procedure: OR Start time: 01/21/2011 10:57 AM Staffing Performed by: anesthesiologist  Preanesthetic Checklist Completed: patient identified, site marked, surgical consent, pre-op evaluation, timeout performed, IV checked, risks and benefits discussed and monitors and equipment checked Spinal Block Patient position: sitting Prep: DuraPrep Patient monitoring: heart rate, cardiac monitor, continuous pulse ox and blood pressure Approach: midline Location: L3-4 Injection technique: single-shot Needle Needle type: Sprotte  Needle gauge: 24 G Needle length: 9 cm Assessment Sensory level: T4 Additional Notes Patient identified.  Risk benefits discussed including failed block, incomplete pain control, headache, nerve damage, paralysis, blood pressure changes, nausea, vomiting, reactions to medication both toxic or allergic, and postpartum back pain.  Patient expressed understanding and wished to proceed.  All questions were answered.  Sterile technique used throughout procedure.  CSF was clear.  No parasthesia or other complications.  Please see nursing notes for vital signs.

## 2011-01-21 NOTE — Progress Notes (Signed)
[id number] 

## 2011-01-21 NOTE — Consult Note (Signed)
Neonatology Note:   Attendance at C-section:    I was asked to attend this repeat C/S at term. The mother is a G6P3A2 O pos, GBS pos with obesity and a history of LGA infants. ROM at delivery, fluid with minimal meconium. Infant vigorous with good spontaneous cry and tone. Needed bulb suctioning for large, clear secretions. Ap 8/9. Lungs clear to ausc in DR. To CN to care of Pediatrician.   Deatra James, MD

## 2011-01-21 NOTE — Addendum Note (Signed)
Addendum  created 01/21/11 1456 by Velna Hatchet   Modules edited:Anesthesia Medication Administration

## 2011-01-21 NOTE — Anesthesia Postprocedure Evaluation (Signed)
Anesthesia Post Note  Patient: Mackenzie [name]  Procedure(s) Performed:  CESAREAN SECTION WITH BILATERAL TUBAL LIGATION - Repeat  Anesthesia type: Spinal  Patient location: PACU  Post pain: Pain level controlled  Post assessment: Post-op Vital signs reviewed  Last Vitals:  Filed Vitals:   01/21/11 1400  BP: 120/71  Pulse: 64  Temp: 97.5 F (36.4 C)  Resp: 20    Post vital signs: Reviewed  Level of consciousness: awake  Complications: No apparent anesthesia complications

## 2011-01-22 LAB — CBC
HCT: 32.8 % — ABNORMAL LOW (ref 36.0–46.0)
MCH: 28.2 pg (ref 26.0–34.0)
MCV: 87.2 fL (ref 78.0–100.0)
Platelets: 240 10*3/uL (ref 150–400)
RDW: 14.2 % (ref 11.5–15.5)
WBC: 9.3 10*3/uL (ref 4.0–10.5)

## 2011-01-22 LAB — GLUCOSE, CAPILLARY
Glucose-Capillary: 151 mg/dL — ABNORMAL HIGH (ref 70–99)
Glucose-Capillary: 134 mg/dL — ABNORMAL HIGH (ref 70–99)

## 2011-01-22 NOTE — Progress Notes (Signed)
Subjective: Postpartum Day 1: Cesarean Delivery Patient reports no problems voiding.  Up ad lib.  Breastfeeding.  Tolerating po intake, using Motrin and Percocet for pain.    Objective: Vital signs in last 24 hours: Temp:  [97.4 F (36.3 C)-98.2 F (36.8 C)] 97.5 F (36.4 C) (08/15 0830) Pulse Rate:  [62-77] 71  (08/15 0830) Resp:  [16-22] 20  (08/15 0830) BP: (103-138)/(56-92) 138/75 mmHg (08/15 0830) SpO2:  [96 %-100 %] 98 % (08/15 0442)  Physical Exam:  General: alert Lochia: appropriate Uterine Fundus: firm Incision: Dressing CDI, JP draining a small amount of serosanguinous fluid (7.5 cc overnight) DVT Evaluation: No evidence of DVT seen on physical exam.   Basename 01/22/11 0525  HGB 10.6*  HCT 32.8*  FBS this am 121.  Assessment/Plan: Status post Cesarean section. Doing well postoperatively.  Gestational Diabetes--insulin-dependent Continue current care. Re-check FBS in am.  Mackenzie [name] 01/22/2011, 10:09 AM

## 2011-01-23 LAB — GLUCOSE, CAPILLARY
Glucose-Capillary: 100 mg/dL — ABNORMAL HIGH (ref 70–99)
Glucose-Capillary: 117 mg/dL — ABNORMAL HIGH (ref 70–99)

## 2011-01-23 NOTE — Progress Notes (Signed)
Subjective: Postpartum Day 2: Cesarean Delivery and BTL Patient reports incisional pain, tolerating PO, + flatus, + BM and no problems voiding.   Tol PO pain med for relief of mild incisional pain Ambulating without diff Breastfeeding established  Objective: Vital signs in last 24 hours: Temp:  [98.2 F (36.8 C)-98.7 F (37.1 C)] 98.2 F (36.8 C) (08/16 0552) Pulse Rate:  [64-78] 66  (08/16 0552) Resp:  [18-20] 18  (08/16 0552) BP: (105-135)/(68-78) 123/74 mmHg (08/16 0552)  Physical Exam:  General: alert, cooperative and no distress Lochia: appropriate Uterine Fundus: firm Incision: healing well, JP drain with 10 ml sero-sanguinous bloody d/c DVT Evaluation: No evidence of DVT seen on physical exam. Calf/Ankle edema is present +1 bilat Breasts soft, NT   Basename 01/22/11 0525  HGB 10.6*  HCT 32.8*    Assessment/Plan: Status post Cesarean section. Doing well postoperatively.  Continue current care.  Plan for discharge in AM per pt's request  Anabel Halon 01/23/2011, 11:45 AM

## 2011-01-24 NOTE — Discharge Summary (Signed)
   Obstetric Discharge Summary Reason for Admission: cesarean section Prenatal Procedures: NST and ultrasound Intrapartum Procedures: cesarean: low cervical, transverse and tubal ligation Postpartum Procedures: none Complications-Operative and Postpartum: none  Temp:  [98.4 F (36.9 C)-98.5 F (36.9 C)] 98.4 F (36.9 C) (08/16 2130) Pulse Rate:  [79-83] 83  (08/16 2130) Resp:  [18] 18  (08/16 2130) BP: (115-130)/(71-79) 130/79 mmHg (08/16 2130) Hemoglobin  Date Value Range Status  01/22/2011 10.6* 12.0-15.0 (g/dL) Final     HCT  Date Value Range Status  01/22/2011 32.8* 36.0-46.0 (%) Final    Hospital Course:  Pt was scheduled for Repeat LT C-Section, with hx of previous C/S x 2 and hx of Macrosomic infants. Insulin dependent GDM. C/S and BTL progressed without complication per Dr. Normand Sloop as well as JP drain placement, Del Viable Female 9lbs9oz.  Post-partum course progressed without diff, pt. Ambulating, voiding, breastfeeding and caring for self and infant without difficulty. On PPday 3 pt had had progressive healing, and toleration of food and po pain management with good results as well as Pos flatus and BM. She desired d/c home.  JP drain removed, intact with 3 ml of pink drainage. Area closed with steri-strip and bandaid. Incision CD and Intact.   Discharge Diagnoses: Term Pregnancy-delivered  Discharge Information: Per [facility] handbook Date: 01/24/2011 Activity: Driving restriction for 2 weeks or while on Narcotic pain med Diet: routine Medications: Percocet 5/325 1-2 po q 4-6 hours prn severe pain Motrin 600 mg po q 6 hours prn pain Medication List  As of 01/24/2011  9:52 AM   CONTINUE taking these medications         cetirizine 10 MG tablet   Commonly known as: ZYRTEC      ferrous sulfate 325 (65 FE) MG tablet      montelukast 10 MG tablet   Commonly known as: SINGULAIR      prenatal vitamin w/FE, FA 27-1 MG Tabs           Condition: stable Instructions:  refer to practice specific booklet Discharge to: home Follow-up Information    Follow up with [facility] in 6 weeks. (or as needed if symptoms worsen)          Newborn Data: Live born  Information for the patient's newborn:  Sapp, Girl Ruhee [[id number]]  female ; APGAR , ; weight ;  Home with mother.  Anabel Halon 01/24/2011, 9:52 AM

## 2011-01-24 NOTE — Progress Notes (Signed)
Subjective: Postpartum Day 3: Cesarean Delivery Patient reports incisional pain, tolerating PO, + flatus, + BM and no problems voiding.    Objective: Vital signs in last 24 hours: Temp:  [98.4 F (36.9 C)-98.5 F (36.9 C)] 98.4 F (36.9 C) (08/16 2130) Pulse Rate:  [79-83] 83  (08/16 2130) Resp:  [18] 18  (08/16 2130) BP: (115-130)/(71-79) 130/79 mmHg (08/16 2130)  Physical Exam:  General: alert, cooperative and no distress Lochia: appropriate Uterine Fundus: firm Incision: healing well, JP drain removed, intact with 3ml pink drainage, no odor DVT Evaluation: No evidence of DVT seen on physical exam. Calf/Ankle edema is present trace and notably less than yesterday   Basename 01/22/11 0525  HGB 10.6*  HCT 32.8*    Assessment/Plan: Status post Cesarean section. Doing well postoperatively.  Discharge home with standard precautions and return to clinic in 4-6 weeks. Pt had BTL for contraception.  Anabel Halon 01/24/2011, 9:48 AM

## 2011-03-20 LAB — COMPREHENSIVE METABOLIC PANEL
BUN: 14
CO2: 21
Chloride: 108
Creatinine, Ser: 0.91
GFR calc non Af Amer: >60
Glucose, Bld: 163 — ABNORMAL HIGH
Total Bilirubin: 0.8

## 2011-03-20 LAB — DIFFERENTIAL
Basophils Absolute: 0
Basophils Relative: 0
Lymphocytes Relative: 10 — ABNORMAL LOW
Neutro Abs: 9.5 — ABNORMAL HIGH
Neutrophils Relative %: 84 — ABNORMAL HIGH

## 2011-03-20 LAB — CBC
HCT: 39
Hemoglobin: 13.2
MCV: 85.3
Platelets: 261
RBC: 4.58
WBC: 11.3 — ABNORMAL HIGH

## 2011-03-20 LAB — POCT CARDIAC MARKERS
CKMB, poc: <1 — ABNORMAL LOW
Myoglobin, poc: 50.2
Myoglobin, poc: 64.3
Operator id: 4533
Troponin i, poc: <0.05

## 2011-03-20 LAB — LIPASE, BLOOD: Lipase: 18

## 2011-03-20 LAB — URINALYSIS, ROUTINE W REFLEX MICROSCOPIC
Nitrite: NEGATIVE
Protein, ur: NEGATIVE
Specific Gravity, Urine: 1.02
Urobilinogen, UA: 0.2

## 2011-03-20 LAB — PREGNANCY, URINE: Preg Test, Ur: NEGATIVE

## 2011-09-10 ENCOUNTER — Encounter (HOSPITAL_COMMUNITY): Payer: Self-pay | Admitting: *Deleted

## 2012-07-24 ENCOUNTER — Other Ambulatory Visit: Payer: Self-pay

## 2012-08-19 ENCOUNTER — Other Ambulatory Visit: Payer: Self-pay | Admitting: Internal Medicine

## 2012-08-19 DIAGNOSIS — Z1231 Encounter for screening mammogram for malignant neoplasm of breast: Secondary | ICD-10-CM

## 2013-04-14 ENCOUNTER — Other Ambulatory Visit: Payer: Self-pay

## 2014-04-10 ENCOUNTER — Encounter (HOSPITAL_COMMUNITY): Payer: Self-pay | Admitting: *Deleted

## 2015-05-10 DIAGNOSIS — E559 Vitamin D deficiency, unspecified: Secondary | ICD-10-CM

## 2015-05-10 HISTORY — DX: Vitamin D deficiency, unspecified: E55.9

## 2015-06-15 MED FILL — AMOXICILLIN 875 MG TABLET: 875 | 7 days supply | Qty: 14 | Fill #0

## 2015-06-15 MED FILL — predniSONE 10 MG (21) TBPK: 10 | 6 days supply | Qty: 21 | Fill #0

## 2015-06-15 MED FILL — HYDROCODONE-HOMATROPINE SYR: 5-1.5 | 7 days supply | Qty: 200 | Fill #0

## 2015-07-10 MED FILL — PHENTERMINE 37.5 MG TABLET: 37.5 | 30 days supply | Qty: 30 | Fill #2

## 2015-08-15 MED FILL — PHENTERMINE 37.5 MG TABLET: 37.5 | 30 days supply | Qty: 30 | Fill #0

## 2015-08-24 MED FILL — AZITHROMYCIN 250 MG TABLET: 250 | 5 days supply | Qty: 6 | Fill #0

## 2015-09-05 DIAGNOSIS — Z0000 Encounter for general adult medical examination without abnormal findings: Secondary | ICD-10-CM | POA: Diagnosis not present

## 2015-09-25 DIAGNOSIS — H2513 Age-related nuclear cataract, bilateral: Secondary | ICD-10-CM | POA: Diagnosis not present

## 2015-09-25 DIAGNOSIS — H5213 Myopia, bilateral: Secondary | ICD-10-CM | POA: Diagnosis not present

## 2015-09-25 DIAGNOSIS — H52223 Regular astigmatism, bilateral: Secondary | ICD-10-CM | POA: Diagnosis not present

## 2015-09-25 DIAGNOSIS — H1045 Other chronic allergic conjunctivitis: Secondary | ICD-10-CM | POA: Diagnosis not present

## 2015-09-25 DIAGNOSIS — H25013 Cortical age-related cataract, bilateral: Secondary | ICD-10-CM | POA: Diagnosis not present

## 2015-09-25 DIAGNOSIS — H524 Presbyopia: Secondary | ICD-10-CM | POA: Diagnosis not present

## 2015-09-25 MED FILL — PAZEO 0.7% EYE DROPS: 0.7 | 25 days supply | Qty: 3 | Fill #0

## 2015-10-15 MED FILL — DOXYCYCLINE HYCLATE 100 MG: 100 | 10 days supply | Qty: 20 | Fill #0

## 2015-10-15 MED FILL — PHENTERMINE 37.5 MG TABLET: 37.5 | 30 days supply | Qty: 30 | Fill #1

## 2015-10-17 MED FILL — ALL DAY ALLERGY 10 MG TAB: 10 | 100 days supply | Qty: 100 | Fill #0

## 2015-11-30 MED FILL — CIPROFLOXACIN HCL 250 MG TA: 250 | 5 days supply | Qty: 10 | Fill #0

## 2015-12-28 MED FILL — PHENTERMINE 37.5 MG TABLET: 37.5 | 30 days supply | Qty: 30 | Fill #2

## 2016-01-03 MED FILL — AZITHROMYCIN 250 MG TABLET: 250 | 5 days supply | Qty: 6 | Fill #0

## 2016-02-05 DIAGNOSIS — R7309 Other abnormal glucose: Secondary | ICD-10-CM | POA: Diagnosis not present

## 2016-02-05 DIAGNOSIS — Z01419 Encounter for gynecological examination (general) (routine) without abnormal findings: Secondary | ICD-10-CM | POA: Diagnosis not present

## 2016-02-05 DIAGNOSIS — E785 Hyperlipidemia, unspecified: Secondary | ICD-10-CM | POA: Diagnosis not present

## 2016-02-05 DIAGNOSIS — Z139 Encounter for screening, unspecified: Secondary | ICD-10-CM | POA: Diagnosis not present

## 2016-02-05 DIAGNOSIS — Z6841 Body Mass Index (BMI) 40.0 and over, adult: Secondary | ICD-10-CM | POA: Diagnosis not present

## 2016-02-05 DIAGNOSIS — Z01411 Encounter for gynecological examination (general) (routine) with abnormal findings: Secondary | ICD-10-CM | POA: Diagnosis not present

## 2016-02-05 DIAGNOSIS — Z0000 Encounter for general adult medical examination without abnormal findings: Secondary | ICD-10-CM | POA: Diagnosis not present

## 2016-02-05 DIAGNOSIS — E559 Vitamin D deficiency, unspecified: Secondary | ICD-10-CM | POA: Diagnosis not present

## 2016-02-19 MED FILL — ALL DAY ALLERGY 10 MG TAB: 10 | 80 days supply | Qty: 80 | Fill #1

## 2016-03-20 DIAGNOSIS — E785 Hyperlipidemia, unspecified: Secondary | ICD-10-CM

## 2016-03-20 HISTORY — DX: Hyperlipidemia, unspecified: E78.5

## 2016-04-04 MED FILL — PHENTERMINE 37.5 MG TABLET: 37.5 | 30 days supply | Qty: 30 | Fill #0

## 2016-05-09 MED FILL — PHENTERMINE 37.5 MG TABLET: 37.5 | 30 days supply | Qty: 30 | Fill #1

## 2016-06-09 DIAGNOSIS — K219 Gastro-esophageal reflux disease without esophagitis: Secondary | ICD-10-CM

## 2016-06-09 DIAGNOSIS — R7309 Other abnormal glucose: Secondary | ICD-10-CM

## 2016-06-09 HISTORY — DX: Gastro-esophageal reflux disease without esophagitis: K21.9

## 2016-06-09 HISTORY — DX: Other abnormal glucose: R73.09

## 2016-06-24 MED FILL — PHENTERMINE 37.5 MG TABLET: 37.5 | 30 days supply | Qty: 30 | Fill #2

## 2016-07-30 MED FILL — PHENTERMINE 37.5 MG TABLET: 37.5 | 30 days supply | Qty: 30 | Fill #0

## 2016-09-04 MED FILL — PHENTERMINE 37.5 MG TABLET: 37.5 | 30 days supply | Qty: 30 | Fill #1

## 2017-01-08 ENCOUNTER — Other Ambulatory Visit: Payer: Self-pay | Admitting: Family Medicine

## 2017-01-08 ENCOUNTER — Ambulatory Visit (HOSPITAL_COMMUNITY)
Admission: EM | Admit: 2017-01-08 | Discharge: 2017-01-08 | Disposition: A | Discharge status: Home / Self Care | Payer: 59 | Attending: Family Medicine | Admitting: Family Medicine

## 2017-01-08 ENCOUNTER — Encounter (HOSPITAL_COMMUNITY): Payer: Self-pay | Admitting: Emergency Medicine

## 2017-01-08 DIAGNOSIS — J069 Acute upper respiratory infection, unspecified: Secondary | ICD-10-CM

## 2017-01-08 DIAGNOSIS — N6320 Unspecified lump in the left breast, unspecified quadrant: Secondary | ICD-10-CM

## 2017-01-08 DIAGNOSIS — N63 Unspecified lump in unspecified breast: Secondary | ICD-10-CM

## 2017-01-08 DIAGNOSIS — B9789 Other viral agents as the cause of diseases classified elsewhere: Secondary | ICD-10-CM

## 2017-01-08 MED ORDER — BENZONATATE 100 MG PO CAPS: 100.0000 mg | ORAL_CAPSULE | Freq: Three times a day (TID) | ORAL | 0 refills | Status: DC

## 2017-01-08 MED ORDER — HYDROCODONE-HOMATROPINE 5-1.5 MG/5ML PO SYRP
5.0000 mL | ORAL_SOLUTION | Freq: Four times a day (QID) | ORAL | 0 refills | Status: DC | PRN
Start: 2017-01-08 — End: 2017-01-26

## 2017-01-08 MED FILL — HYDROCODONE-HOMATROPINE SYR: 5-1.5 | 5 days supply | Qty: 90 | Fill #0

## 2017-01-08 MED FILL — BENZONATATE 100 MG CAP: 100 | 7 days supply | Qty: 21 | Fill #0

## 2017-01-08 NOTE — ED Provider Notes (Signed)
  [name]   [id number] 01/08/17 Arrival Time: 2542  ASSESSMENT & PLAN:  1. Viral URI with cough   2. Breast mass, left     Meds ordered this encounter  Medications  . HYDROcodone-homatropine (HYCODAN) 5-1.5 MG/5ML syrup    Sig: Take 5 mLs by mouth every 6 (six) hours as needed for cough.    Dispense:  90 mL    Refill:  0  . benzonatate (TESSALON) 100 MG capsule    Sig: Take 1 capsule (100 mg total) by mouth every 8 (eight) hours.    Dispense:  21 capsule    Refill:  0   OTC as needed. Symptom care. [facility]; paper work given. She will call to schedule f/u.  Reviewed expectations re: course of current medical issues. Questions answered. Outlined signs and symptoms indicating need for more acute intervention. Patient verbalized understanding. After Visit Summary given.   SUBJECTIVE:  Mackenzie [name] is a 46 y.o. female who presents with complaint of URI symptoms and persistent coughing for 1 week. Nasal congestion has improved but coughing is continuing. Worse during the day but affects her at night. Afebrile. No SOB or wheezing. OTC without relief.  Also notice L breast "lump" approx 4 days ago. No h/o similar. No skin changes or nipple discharge. Patient's last menstrual period was 12/25/2016.  ROS: As per HPI. All other systems negative.   OBJECTIVE:  Vitals:   01/08/17 1111  BP: (!) 145/78  Pulse: 80  Resp: 16  SpO2: 98%  Weight: 278 lb (126.1 kg)  Height: 5\' 6"  (1.676 m)     General appearance: alert; no distress HEENT: mild nasal congestion Neck: supple Lungs: clear to auscultation bilaterally; dry cough CV: normal heart sounds L breast: approx 4x5 cm mass felt at center of breast just above nipple; nipple appears normal; no nipple discharge; no skin changes (chaperone present for exam) Skin: warm and dry Psychological:  alert and cooperative; normal mood and affect  No Known Allergies  PMHx, SurgHx,  SocialHx, Medications, and Allergies were reviewed in the Visit Navigator and updated as appropriate.      Vanessa Kick, MD 01/08/17 1213

## 2017-01-08 NOTE — Discharge Instructions (Signed)
[facility] call [facility] to schedule an appointment for evaluation. See paperwork.

## 2017-01-08 NOTE — ED Triage Notes (Signed)
PT reports productive cough and sore throat for 1 week. PT reports swelling in left breast that started Sunday.

## 2017-01-09 ENCOUNTER — Ambulatory Visit
Admission: RE | Admit: 2017-01-09 | Discharge: 2017-01-09 | Disposition: A | Discharge status: Home / Self Care | Payer: 59 | Source: Ambulatory Visit | Attending: Family Medicine | Admitting: Family Medicine

## 2017-01-09 ENCOUNTER — Other Ambulatory Visit: Payer: Self-pay | Admitting: Family Medicine

## 2017-01-09 DIAGNOSIS — R599 Enlarged lymph nodes, unspecified: Secondary | ICD-10-CM

## 2017-01-09 DIAGNOSIS — N6320 Unspecified lump in the left breast, unspecified quadrant: Secondary | ICD-10-CM

## 2017-01-09 DIAGNOSIS — R921 Mammographic calcification found on diagnostic imaging of breast: Secondary | ICD-10-CM

## 2017-01-09 DIAGNOSIS — N63 Unspecified lump in unspecified breast: Secondary | ICD-10-CM

## 2017-01-09 DIAGNOSIS — N6489 Other specified disorders of breast: Secondary | ICD-10-CM | POA: Diagnosis not present

## 2017-01-09 DIAGNOSIS — R922 Inconclusive mammogram: Secondary | ICD-10-CM | POA: Diagnosis not present

## 2017-01-14 ENCOUNTER — Ambulatory Visit
Admission: RE | Admit: 2017-01-14 | Discharge: 2017-01-14 | Disposition: A | Discharge status: Home / Self Care | Payer: 59 | Source: Ambulatory Visit | Attending: Family Medicine | Admitting: Family Medicine

## 2017-01-14 ENCOUNTER — Other Ambulatory Visit: Payer: Self-pay | Admitting: Family Medicine

## 2017-01-14 DIAGNOSIS — R590 Localized enlarged lymph nodes: Secondary | ICD-10-CM | POA: Diagnosis not present

## 2017-01-14 DIAGNOSIS — C50919 Malignant neoplasm of unspecified site of unspecified female breast: Secondary | ICD-10-CM

## 2017-01-14 DIAGNOSIS — D0512 Intraductal carcinoma in situ of left breast: Secondary | ICD-10-CM | POA: Diagnosis not present

## 2017-01-14 DIAGNOSIS — N6320 Unspecified lump in the left breast, unspecified quadrant: Secondary | ICD-10-CM

## 2017-01-14 DIAGNOSIS — R921 Mammographic calcification found on diagnostic imaging of breast: Secondary | ICD-10-CM | POA: Diagnosis not present

## 2017-01-14 DIAGNOSIS — N6322 Unspecified lump in the left breast, upper inner quadrant: Secondary | ICD-10-CM | POA: Diagnosis not present

## 2017-01-14 DIAGNOSIS — R599 Enlarged lymph nodes, unspecified: Secondary | ICD-10-CM

## 2017-01-14 DIAGNOSIS — C50212 Malignant neoplasm of upper-inner quadrant of left female breast: Secondary | ICD-10-CM | POA: Diagnosis not present

## 2017-01-14 DIAGNOSIS — C773 Secondary and unspecified malignant neoplasm of axilla and upper limb lymph nodes: Secondary | ICD-10-CM | POA: Diagnosis not present

## 2017-01-14 HISTORY — DX: Malignant neoplasm of unspecified site of unspecified female breast: C50.919

## 2017-01-21 ENCOUNTER — Other Ambulatory Visit: Payer: Self-pay | Admitting: Surgery

## 2017-01-21 ENCOUNTER — Ambulatory Visit: Payer: Self-pay | Admitting: Surgery

## 2017-01-21 DIAGNOSIS — C50912 Malignant neoplasm of unspecified site of left female breast: Secondary | ICD-10-CM | POA: Diagnosis not present

## 2017-01-21 NOTE — H&P (Signed)
Mackenzie [name] 01/21/2017 2:11 PM Location: [facility] Patient #: [id number] DOB: 09/01/1970 Married / Language: English / Race: Black or African American Female  History of Present Illness (Mackenzie [name] A. Mackenzie Horacek MD; 01/21/2017 3:24 PM) Patient words: The patient is sent at the request of Dr. Drew [name] for left breast mass. Patient noted changes to her left breast over the last 2 weeks which include thickening of her nipple and forms left breast. She underwent workup which included mammography, all showing core biopsy. 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 She had 4 suspicious nodes underwent core biopsy which showed metastatic breast cancer. Patient also now has a chronic cough. Patient denies back pain for extremity pain. She does complain of soreness in her breast biopsy site.                               CLINICAL DATA: Left breast palpable area felt by the patient approximately 1 week ago.  EXAM: 2D DIGITAL DIAGNOSTIC BILATERAL MAMMOGRAM WITH CAD AND ADJUNCT TOMO  ULTRASOUND LEFT BREAST  COMPARISON: Previous exam(s).  ACR Breast Density Category c: The breast tissue is heterogeneously dense, which may obscure small masses.  FINDINGS: Mammographically, there are no suspicious masses, areas of architectural distortion or microcalcifications in the right breast.  There is a regional asymmetry in the left central and upper inner breast, anterior and middle depth. Malignant type calcifications are seen involving all 4 quadrants of the breast and spanning the thickness of the breast with the most clustering in the upper inner quadrant, which corresponds to the area of palpable concern. There is anterior skin thickening and slight  trabecular thickening.  Mammographic images were processed with CAD.  On physical exam, there is firmness and diffuse palpable thickening in the anterior left breast. Skin dimpling is also seen in the upper inner left breast, anterior depth.  Targeted ultrasound is performed, showing left breast 930 o'clock 3 cm from the nipple, the area of palpable concern, irregular hypoechoic fragmented mass which measures 2.7 x 2.6 x 3.1 cm. In the left axilla there are 4 abnormal lymph nodes, the largest of which measures 2.7 cm in long axis, and demonstrates complete effacement of its hilum.  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  No mammographic evidence of malignancy in the right breast.  Malignant left axillary lymphadenopathy.  RECOMMENDATION: Ultrasound-guided core needle biopsy of left breast 930 o'clock mass and the largest abnormal left axillary lymph node.  Stereotactic core needle biopsy of group of malignant appearing calcifications in the lower outer left breast, to establish extent of disease.  I have discussed the findings and recommendations with the patient. Results were also provided in writing at the conclusion of the visit. If applicable, a reminder letter will be sent to the patient regarding the next appointment.  BI-RADS CATEGORY 5: Highly suggestive of malignancy.   Electronically Signed By: Mackenzie [name] M.D. On: 01/09/2017 09:19            ADDITIONAL INFORMATION: 1. PROGNOSTIC INDICATORS Results: IMMUNOHISTOCHEMICAL AND MORPHOMETRIC ANALYSIS PERFORMED Mackenzie [name] Estrogen Receptor: 100%, POSITIVE,   STRONG STAINING INTENSITY Progesterone Receptor: 60%, POSITIVE, STRONG STAINING INTENSITY REFERENCE RANGE ESTROGEN RECEPTOR NEGATIVE  0% POSITIVE =>1% REFERENCE RANGE PROGESTERONE RECEPTOR NEGATIVE 0% POSITIVE =>1% All controls stained appropriately Mackenzie Laws MD Pathologist, Electronic Signature ( Signed 01/20/2017) 2. PROGNOSTIC INDICATORS Results: IMMUNOHISTOCHEMICAL AND MORPHOMETRIC ANALYSIS PERFORMED Mackenzie [name] Estrogen Receptor: 100%, POSITIVE, STRONG STAINING INTENSITY Progesterone Receptor: 20%, POSITIVE, STRONG STAINING INTENSITY Proliferation Marker Ki67: 20% REFERENCE RANGE ESTROGEN RECEPTOR NEGATIVE 0% 1 of 3 FINAL for Mackenzie [name] [phone]) ADDITIONAL INFORMATION:(continued) POSITIVE =>1% REFERENCE RANGE PROGESTERONE RECEPTOR NEGATIVE 0% POSITIVE =>1% All controls stained appropriately Mackenzie Laws MD Pathologist, Electronic Signature ( Signed 01/20/2017) 2. FLUORESCENCE IN-SITU HYBRIDIZATION Results: HER2 - NEGATIVE RATIO OF HER2/CEP17 SIGNALS 1.38 AVERAGE HER2 COPY NUMBER PER CELL 2.90 Reference Range: NEGATIVE HER2/CEP17 Ratio <2.0 and average HER2 copy number <4.0 EQUIVOCAL HER2/CEP17 Ratio <2.0 and average HER2 copy number >=4.0 and <6.0 POSITIVE HER2/CEP17 Ratio >=2.0 or <2.0 and average HER2 copy number >=6.0 Mackenzie Males MD Pathologist, Electronic Signature ( Signed 01/16/2017) FINAL DIAGNOSIS Diagnosis 1. Breast, left, needle core biopsy, lower outer - DUCTAL CARCINOMA IN SITU WITH CALCIFICATIONS, SEE COMMENT. 2. Breast, left, needle core biopsy, 9:30 o'clock - INVASIVE DUCTAL CARCINOMA WITH EXTRACELLULAR MUCIN, SEE COMMENT. 3. Lymph node, needle/core biopsy, axilla - METASTATIC CARCINOMA IN ONE LYMPH NODE (1/1). Microscopic Comment 1. The carcinoma in situ appears high grade. Prognostic markers will be ordered. 2. The carcinoma appears grade 1-2. Prognostic markers will be ordered. Dr. Lyndon Code has reviewed the case. The case was called to [facility] on 01/15/2017. Mackenzie Males MD Pathologist, Electronic Signature (Case signed 01/15/2017) 2 of  3 FINAL for Mackenzie [name], [name] (Mackenzie [name]) Specimen Gross and Clinical Information Specimen Comment 1. TIF: 11:50 AM; extracted < 5 min; calcs 2. TIF: 12:15 PM; extracted < 30 sec; palp mass 3. TIF: 12:30 PM; extracted < 30 sec; enlarged lymph node Specimen(s) Obtained: 1. Breast, left, needle core biopsy, lower outer 2. Breast, left, needle core biopsy, 9:30 o'clock 3. Lymph node, needle/core biopsy, axilla Specimen Clinical Information 1. C/F DCIS 2. C/F IDC 3. C/F met node Gross 1. Received labeled as "Pavlicek,Cathi" and "Lt breast calcs LOQ" ( TIF 1150 CIT <52mn) are multiple cores and irregular pieces of yellow to gray white soft to rubbery tissue, aggregating 2.1 x 1.6 x 0.2 cm. Pieces clinically having microcalcifications are submitted in block A and remaining specimen in block B. Total 2 blocks (SSW 8/8) 2. Received labeled as "Kallman,Zuriel" and "Left breast 930" ( TIF 1215 CIT <30secs) are 3 cores of yellow to gray white soft to firm tissue, each averaging 1.3 x 0.2 x 0.2 cm. One block submitted. (SSW 8/8) 3. Received labeled as "Bustamante,Icesis" and "Left axilla" ( TIF 1230 CIT <30secs) are 2 cores of yellow to gray white soft to firm tissue, 0.7 x 0.1 x 0.1 cm and 1.2 x 0.1 x 0.1 cm. One block submitted. (SSW 8/8) Stain(s) used in Diagnosis: The following stain(s) were used in diagnosing the case: Her2 FISH, PR-ACIS, KI-67-ACIS, ER-ACIS. The control(s) stained appropriately. Disclaimer Estrogen receptor (6F11), immunohistochemical stains are performed on formalin fixed, paraffin embedded tissue using a 3,3"-diaminobenzidine (DAB) chromogen and Leica Bond Autostainer System. The staining intensity of the nucleus is scored Mackenzie [name] and is reported as the percentage of tumor cell nuclei demonstrating specific nuclear staining.Specimens are fixed in 10% Neutral Buffered Formalin for at least 6 hours and up to 72 hours. These tests have not be validated on decalcified tissue.  Results should be  interpreted with caution given the possibility of false negative results on decalcified specimens. HER2 IQFISH pharmDX (code [phone]) is a direct fluorescence in-situ hybridization assay designed to quantitatively determine HER2 gene amplification in formalin-fixed, paraffin-embedded tissue specimens. It is performed at [facility] and is reported using ASCO/CAP scoring criteria published in 2013. Ki-67 (MM1), immunohistochemical stains are performed on formalin fixed, paraffin embedded tissue using a 3,3"-diaminobenzidine (DAB) chromogen and Leica Bond Autostainer System. The staining intensity of the nucleus is scored Mackenzie [name] and is reported as the percentage of tumor cell nuclei demonstrating specific nuclear staining.Specimens are fixed in 10% Neutral Buffered Formalin for at least 6 hours and up to 72 hours. These tests have not be validated on decalcified tissue. Results should be interpreted with caution given the possibility of false negative results on decalcified specimens. PR progesterone receptor (16), immunohistochemical stains are performed on formalin fixed, paraffin embedded tissue using a 3,3"-diaminobenzidine (DAB) chromogen and Leica Bond Autostainer System. The staining intensity of the nucleus is scored Mackenzie [name] and is reported as the percentage of tumor cell nuclei demonstrating specific nuclear staining.Specimens are fixed in 10% Neutral Buffered Formalin for at least 6 hours and up to 72 hours. These tests have not be validated on decalcified tissue. Results should be interpreted with caution given the possibility of false negative results on decalcified specimens. Report signed out from the following location(s) Technical Component was performed at [facility]. [address],[place] [address].[id number]., Interpretation was performed at [address], [place], [place]  [address]. CLIA #: [id number], 3 of 3.  The patient is a 46 year old female.   Past Surgical History Malachy Moan, [place]; 01/21/2017 2:11 PM) Cesarean Section - Multiple  Diagnostic Studies History Malachy Moan, [place]; 01/21/2017 2:11 PM) Colonoscopy never Mammogram within last year Pap Smear 1-5 years ago  Allergies Malachy Moan, RMA; 01/21/2017 2:12 PM) No Known Allergies 01/21/2017  Medication History Malachy Moan, RMA; 01/21/2017 2:14 PM) Hydrocodone-Homatropine (5-1.5MG/5ML Syrup, Oral) Active. Benzonatate (100MG Capsule, Oral) Active. Singulair (10MG Tablet, Oral) Active. Vitamin D (Cholecalciferol) (Oral) Specific strength unknown - Active. Calcium (Oral) Specific strength unknown - Active. Medications Reconciled  Social History Malachy Moan, [place]; 01/21/2017 2:11 PM) Alcohol use Occasional alcohol use. Caffeine use Tea. No drug use Tobacco use Never smoker.  Family History Malachy Moan, [place]; 01/21/2017 2:11 PM) Alcohol Abuse Father. Cerebrovascular Accident Mother. Diabetes Mellitus Mother. Heart Disease Mother. Heart disease in female family member before age 11 Hypertension Mother. Kidney Disease Father. Seizure disorder Mother.  Pregnancy / Birth History Malachy Moan, [place]; 01/21/2017 2:11 PM) Age at menarche 66 years. Contraceptive History Depo-provera, Oral contraceptives. Gravida 6 Length (months) of breastfeeding 12-24 Maternal age 57-25 Para 4 Regular periods  Other Problems Malachy Moan, [place]; 01/21/2017 2:11 PM) Diabetes Mellitus Gastroesophageal Reflux Disease Lump In Breast     Review of Systems Malachy Moan RMA; 01/21/2017 2:11 PM) General Not Present- Appetite Loss, Chills, Fatigue, Fever, Night Sweats, Weight Gain and Weight Loss. Skin Present- New Lesions. Not Present- Change in Wart/Mole, Dryness, Hives, Jaundice, Non-Healing Wounds, Rash and Ulcer. HEENT Present- Wears  glasses/contact lenses. Not Present- Earache, Hearing Loss, Hoarseness, Nose Bleed, Oral Ulcers, Ringing in the Ears, Seasonal Allergies, Sinus Pain, Sore Throat, Visual Disturbances and Yellow Eyes. Respiratory Present- Chronic Cough. Not Present- Bloody sputum, Difficulty Breathing, Snoring and Wheezing. Breast Present- Breast Mass. Not Present- Breast Pain, Nipple Discharge and Skin Changes. Cardiovascular Not Present- Chest Pain, Difficulty Breathing Lying Down, Leg Cramps, Palpitations, Rapid  Heart Rate, Shortness of Breath and Swelling of Extremities. Gastrointestinal Not Present- Abdominal Pain, Bloating, Bloody Stool, Change in Bowel Habits, Chronic diarrhea, Constipation, Difficulty Swallowing, Excessive gas, Gets full quickly at meals, Hemorrhoids, Indigestion, Nausea, Rectal Pain and Vomiting. Female Genitourinary Not Present- Frequency, Nocturia, Painful Urination, Pelvic Pain and Urgency. Musculoskeletal Not Present- Back Pain, Joint Pain, Joint Stiffness, Muscle Pain, Muscle Weakness and Swelling of Extremities. Neurological Not Present- Decreased Memory, Fainting, Headaches, Numbness, Seizures, Tingling, Tremor, Trouble walking and Weakness. Psychiatric Not Present- Anxiety, Bipolar, Change in Sleep Pattern, Depression, Fearful and Frequent crying. Endocrine Not Present- Cold Intolerance, Excessive Hunger, Hair Changes, Heat Intolerance, Hot flashes and New Diabetes. Hematology Not Present- Blood Thinners, Easy Bruising, Excessive bleeding, Gland problems, HIV and Persistent Infections.  Vitals Malachy Moan RMA; 01/21/2017 2:15 PM) 01/21/2017 2:14 PM Weight: 280.6 lb Height: 66in Body Surface Area: 2.31 m Body Mass Index: 45.29 kg/m  Temp.: 97.31F  Pulse: 87 (Regular)  BP: 162/100 (Sitting, Left Arm, Standard)      Physical Exam (Yanna Leaks A. Dempsey Ahonen MD; 01/21/2017 3:25 PM)  General Mental Status-Alert. General Appearance-Consistent with stated  age. Hydration-Well hydrated. Voice-Normal.  Head and Neck Head-normocephalic, atraumatic with no lesions or palpable masses. Trachea-midline. Thyroid Gland Characteristics - normal size and consistency.  Eye Eyeball - Bilateral-Extraocular movements intact. Sclera/Conjunctiva - Bilateral-No scleral icterus.  Chest and Lung Exam Chest and lung exam reveals -quiet, even and easy respiratory effort with no use of accessory muscles and on auscultation, normal breath sounds, no adventitious sounds and normal vocal resonance. Inspection Chest Wall - Normal. Back - normal.  Breast Note: Left breast is thickened nipple with dimpling of the skin. There is no erythema or signs of infection or signs of inflammatory breast cancer. The breast is firm especially centrally. Right breast is normal. There is mild to moderate left axillary adenopathy. None of the nodes appear fixed.  Cardiovascular Cardiovascular examination reveals -normal heart sounds, regular rate and rhythm with no murmurs and normal pedal pulses bilaterally.  Abdomen Inspection Inspection of the abdomen reveals - No Hernias. Skin - Scar - no surgical scars. Palpation/Percussion Palpation and Percussion of the abdomen reveal - Soft, Non Tender, No Rebound tenderness, No Rigidity (guarding) and No hepatosplenomegaly. Auscultation Auscultation of the abdomen reveals - Bowel sounds normal.  Musculoskeletal Normal Exam - Left-Upper Extremity Strength Normal and Lower Extremity Strength Normal. Normal Exam - Right-Upper Extremity Strength Normal and Lower Extremity Strength Normal.  Lymphatic Head & Neck  General Head & Neck Lymphatics: Bilateral - Description - Normal. Axillary -Note:Left axillary adenopathy noted. Right nasal is normal. There is no evidence of supraclavicular adenopathy.     Assessment & Plan (Mayukha Symmonds A. Jeani Fassnacht MD; 01/21/2017 3:26 PM)  BREAST CANCER, LEFT (C50.912) Impression:  Referral to medical and radiation oncology. This does not appear clinically to be inflammatory breast cancer therefore I do not recommend a skin punch biopsy at this time. If that were to change, we can consider that. I recommend MRI in the placement of a Port-A-Cath for chemotherapy. I discussed the role of chemotherapy as well as potential surgery down the road. She may need staging scans and will see medical oncology and radiation oncology Pt requires port placement for chemotherapy. Risk include bleeding, infection, pneumothorax, hemothorax, mediastinal injury, nerve injury , blood vessel injury, strke, blood clots, death, migration. embolization and need for additional procedures. Pt agrees to proceed.  Current Plans You are being scheduled for surgery- Our schedulers will call you.  You should hear from  our office's scheduling department within 5 working days about the location, date, and time of surgery. We try to make accommodations for patient's preferences in scheduling surgery, but sometimes the OR schedule or the surgeon's schedule prevents [name] from making those accommodations.  If you have not heard from our office [phone]) in 5 working days, call the office and ask for your surgeon's nurse.  If you have other questions about your diagnosis, plan, or surgery, call the office and ask for your surgeon's nurse.  Pt Education - [facility] Breast Cancer Information Given - [facility] "Breast Journey" Package Pt Education - Pamphlet Given - Breast Biopsy: discussed with patient and provided information. Referred to Oncology, for evaluation and follow up (Oncology). Routine. Referred to Radiation Oncology, for evaluation and follow up (Radiation Oncology). Routine. Use of a central venous catheter for intravenous therapy was discussed. Technique of catheter placement using ultrasound and fluoroscopy guidance was discussed. Risks such as bleeding, infection, pneumothorax, catheter occlusion,  reoperation, and other risks were discussed. I noted a good likelihood this will help address the problem. Questions were answered. The patient expressed understanding & wishes to proceed.

## 2017-01-22 ENCOUNTER — Other Ambulatory Visit: Payer: Self-pay | Admitting: Oncology

## 2017-01-22 ENCOUNTER — Ambulatory Visit
Admission: RE | Admit: 2017-01-22 | Discharge: 2017-01-22 | Disposition: A | Discharge status: Home / Self Care | Payer: 59 | Source: Ambulatory Visit | Attending: Surgery | Admitting: Surgery

## 2017-01-22 DIAGNOSIS — C50912 Malignant neoplasm of unspecified site of left female breast: Secondary | ICD-10-CM | POA: Diagnosis not present

## 2017-01-22 MED ORDER — GADOBENATE DIMEGLUMINE 529 MG/ML IV SOLN
20.0000 mL | Freq: Once | INTRAVENOUS | Status: AC | PRN
  Administered 2017-01-22: 20 mL via INTRAVENOUS

## 2017-01-23 ENCOUNTER — Encounter: Payer: Self-pay | Admitting: Radiation Oncology

## 2017-01-23 ENCOUNTER — Encounter: Payer: Self-pay | Admitting: *Deleted

## 2017-01-23 ENCOUNTER — Ambulatory Visit (HOSPITAL_BASED_OUTPATIENT_CLINIC_OR_DEPARTMENT_OTHER): Payer: 59 | Admitting: Oncology

## 2017-01-23 DIAGNOSIS — D0512 Intraductal carcinoma in situ of left breast: Secondary | ICD-10-CM

## 2017-01-23 DIAGNOSIS — C773 Secondary and unspecified malignant neoplasm of axilla and upper limb lymph nodes: Secondary | ICD-10-CM

## 2017-01-23 DIAGNOSIS — C50912 Malignant neoplasm of unspecified site of left female breast: Secondary | ICD-10-CM | POA: Insufficient documentation

## 2017-01-23 DIAGNOSIS — Z170 Estrogen receptor positive status [ER+]: Secondary | ICD-10-CM | POA: Diagnosis not present

## 2017-01-23 DIAGNOSIS — C50812 Malignant neoplasm of overlapping sites of left female breast: Secondary | ICD-10-CM | POA: Insufficient documentation

## 2017-01-23 HISTORY — DX: Malignant neoplasm of unspecified site of left female breast: C50.912

## 2017-01-23 NOTE — Progress Notes (Signed)
[name]  [phone] [phone]) [phone]     ID: Mack[name] DOB: [birth year]  MR#: [id number]  FXT#:[id number]  Patient Care Team: Mackenzie Blade, MD as PCP - General (Internal Medicine) [name], [place]enzie Dad, MD as Consulting Physician (Oncology) [place] Luna, MD as Consulting Physician (General Surgery) Mackenzie Rudd, MD as Consulting Physician (Radiation Oncology) [place]enzie Cruel, MD OTHER MD:  CHIEF COMPLAINT: Estrogen receptor positive breast cancer  CURRENT TREATMENT: Neoadjuvant chemotherapy pending   HISTORY OF CURRENT ILLNESS: The patient herself noted some changes in her left breast late July 2018, she says, and eventually brought this to medical attention so that on 01/09/2017 she underwent bilateral diagnostic mammography with tomography and left breast ultrasonography at the [name]. This found the breast density to be category C. In the upper inner quadrant of the left breast there was an area of asymmetry and there were malignant type calcifications involving all 4 quadrants. On exam there is firmness and palpable thickening in the anterior left breast with skin dimpling. Ultrasonography found at the 9:30 o'clock radiant 3 cm from the nipple a 2.7 cm mass and in the left axilla for abnormal lymph nodes largest of which measured 2.7 cm.  Biopsy of the left breast 9:30 o'clock mass 80 01/26/2017 showed ([id number]) invasive ductal carcinoma, with extracellular mucin, grade 1 or 2. In the lower outer left breast is separated biopsy the same day showed ductal carcinoma in situ. One of the 4 lymph nodes involved was positive for metastatic carcinoma. Prognostic panel on the invasive disease showed it to be estrogen receptor 100% positive, progesterone receptor 20% positive, both with strong staining intensity, with an MIB-1 of 20%, and no HER-2 amplification with a signals ratio 1.38 and the number per cell 2.90.  On 01/22/2017 the  patient underwent bilateral breast MRI. This showed no involvement of the right breast, but in the left breast there was a masslike and non-masslike enhancement involving all quadrants, with skin swelling but no abnormal enhancement of the skin or nipple areolar complex or pectoralis muscle. The mass could not be clearly measured but spanned approximately 12 cm. There was bulky left axillary lymphadenopathy, with the largest lymph node measuring up to 3.4 cm.  The patient's subsequent history is as detailed below.  INTERVAL HISTORY: [place] [name] was evaluated in the breast clinic 01/23/2017 accompanied by her husband [name] and her good friend [name]. Her case was also presented at the multidisciplinary breast cancer conference 01/21/2017. At that time a preliminary plan was proposed: Genetics testing, staging studies, port placement and neoadjuvant chemotherapy, with surgery to follow.   REVIEW OF SYSTEMS: Does seem has had a persistent cough for some time. This is dry, not accompanied by fever phlegm or pleurisy. She's also had persistent headaches for several weeks. These are not very severe but they do occur every day. She denies nausea or vomiting, visual changes, dizziness, or gait imbalance. There has been no significant weight loss or unexplained fatigue. She denies rash, fever or bleeding. She denies any unusual pain. A detailed review of systems today was otherwise stable  PAST MEDICAL HISTORY: Past Medical History:  Diagnosis Date  . Anemia   . Diabetes mellitus    GDM    PAST SURGICAL HISTORY: Past Surgical History:  Procedure Laterality Date  . CESAREAN SECTION     x2    FAMILY HISTORY Family History  Problem Relation Age of Onset  . Breast cancer Paternal Grandmother   The patient's father still alive  at age 71. The patient's mother died with a brain tumor which the patient says was "benign". She was 56. The patient has one brother, no sisters. The only breast cancer in the  family is a paternal grandmother who died from breast cancer at an unknown age. There is no history of ovarian or prostate cancer in the family.  GYNECOLOGIC HISTORY:  Patient's last menstrual period was 12/25/2016. Menarche age 77 and first live birth age 36 she is [name] P4. The person is still having regular periods as of August 2018  SOCIAL HISTORY: (As of August 2018) [place] [name] works at [name] in the fifth floor [name]. Her husband [place] [name] is a Building control surveyor. Daughter and married and lives in [name] and works as a Education administrator. Daughter [name] lives in [name] and works in the police department. [name] are 50 and 6, living at home. The patient has no grandchildren. She attends a Tour manager    ADVANCED DIRECTIVES: Not in place   HEALTH MAINTENANCE: Social History  Substance Use Topics  . Smoking status: Never Smoker  . Smokeless tobacco: Never Used  . Alcohol use Yes     Colonoscopy: Never  PAP: December 2017  Bone density: Never   No Known Allergies  Current Outpatient Prescriptions  Medication Sig Dispense Refill  . Ascorbic Acid (VITAMIN C) 100 MG tablet Take 100 mg by mouth daily.    . cetirizine (ZYRTEC) 10 MG tablet Take 10 mg by mouth daily.      . cholecalciferol (VITAMIN D) 1000 units tablet Take 1,000 Units by mouth daily.    . Omega-3 Fatty Acids (FISH OIL) 1000 MG CAPS Take by mouth.    . prenatal vitamin w/FE, FA (PRENATAL 1 + 1) 27-1 MG TABS Take 1 tablet by mouth daily.      . benzonatate (TESSALON) 100 MG capsule Take 1 capsule (100 mg total) by mouth every 8 (eight) hours. (Patient not taking: Reported on 01/23/2017) 21 capsule 0  . ferrous sulfate 325 (65 FE) MG tablet Take 325 mg by mouth daily.      Marland Kitchen HYDROcodone-homatropine (HYCODAN) 5-1.5 MG/5ML syrup Take 5 mLs by mouth every 6 (six) hours as needed for cough. (Patient not taking: Reported on 01/23/2017) 90 mL 0  . montelukast (SINGULAIR) 10 MG tablet Take 10 mg by  mouth at bedtime.       No current facility-administered medications for this visit.     OBJECTIVE: Morbidly obese African-American woman who appears stated age  27:   01/23/17 1602  BP: (!) 155/72  Pulse: (!) 101  Resp: 18  Temp: 98.2 F (36.8 C)  SpO2: 100%     Body mass index is 45.4 kg/m.   Wt Readings from Last 3 Encounters:  01/23/17 281 lb 4.8 oz (127.6 kg)  01/08/17 278 lb (126.1 kg)  01/21/11 269 lb (122 kg)      ECOG FS:1 - Symptomatic but completely ambulatory  Ocular: Sclerae unicteric, pupils round and equal Ear-nose-throat: Oropharynx clear and moist Lymphatic: No cervical or supraclavicular adenopathy Lungs no rales or rhonchi Heart regular rate and rhythm Abd soft, obese, nontender, positive bowel sounds MSK no focal spinal tenderness, no joint edema Neuro: non-focal, well-oriented, appropriate affect Breasts: The right breast is unremarkable. The left breast is status post recent biopsy. There is no ecchymosis. In the upper portion of the breast there is a movable mass measuring approximately 7 cm. There is no overlying erythema. I do not palpate obvious adenopathy  in either breast.   LAB RESULTS:  CMP     Component Value Date/Time   NA 131 (L) 01/15/2011 1202   K 3.8 01/15/2011 1202   CL 100 01/15/2011 1202   CO2 22 01/15/2011 1202   GLUCOSE 107 (H) 01/15/2011 1202   BUN 9 01/15/2011 1202   CREATININE 0.63 01/15/2011 1202   CALCIUM 10.1 01/15/2011 1202   PROT 7.1 03/13/2007 0600   ALBUMIN 4.0 03/13/2007 0600   AST 23 03/13/2007 0600   ALT 33 03/13/2007 0600   ALKPHOS 71 03/13/2007 0600   BILITOT 0.8 03/13/2007 0600   GFRNONAA >60 01/15/2011 1202   GFRAA >60 01/15/2011 1202    No results found for: TOTALPROTELP, ALBUMINELP, A1GS, A2GS, BETS, BETA2SER, GAMS, MSPIKE, SPEI  No results found for: Nils Pyle, [name]  Lab Results  Component Value Date   WBC 9.3 01/22/2011   NEUTROABS 9.5 (H) 03/13/2007   HGB 10.6 (L)  01/22/2011   HCT 32.8 (L) 01/22/2011   MCV 87.2 01/22/2011   PLT 240 01/22/2011      Chemistry      Component Value Date/Time   NA 131 (L) 01/15/2011 1202   K 3.8 01/15/2011 1202   CL 100 01/15/2011 1202   CO2 22 01/15/2011 1202   BUN 9 01/15/2011 1202   CREATININE 0.63 01/15/2011 1202      Component Value Date/Time   CALCIUM 10.1 01/15/2011 1202   ALKPHOS 71 03/13/2007 0600   AST 23 03/13/2007 0600   ALT 33 03/13/2007 0600   BILITOT 0.8 03/13/2007 0600       No results found for: LABCA2  No components found for: [id number]  No results for input(s): INR in the last 168 hours.  No results found for: LABCA2  No results found for: WUJ811  No results found for: BJY782  No results found for: NFA213  No results found for: CA2729  No components found for: HGQUANT  No results found for: CEA1 / No results found for: CEA1   No results found for: AFPTUMOR  No results found for: CHROMOGRNA  No results found for: PSA1  No visits with results within 3 Day(s) from this visit.  Latest known visit with results is:  Admission on 01/21/2011, Discharged on 01/24/2011  Component Date Value Ref Range Status  . ABO/RH(D) 01/21/2011 O POS   Final  . Antibody Screen 01/21/2011 NEG   Final  . Sample Expiration 01/21/2011 01/24/2011   Final  . Glucose-Capillary 01/21/2011 92  70 - 99 mg/dL Final  . ABO/RH(D) 01/21/2011 O POS   Final  . Glucose-Capillary 01/21/2011 70  70 - 99 mg/dL Final  . Glucose-Capillary 01/21/2011 108* 70 - 99 mg/dL Final  . Glucose-Capillary 01/21/2011 122* 70 - 99 mg/dL Final  . WBC 01/22/2011 9.3  4.0 - 10.5 K/uL Final  . RBC 01/22/2011 3.76* 3.87 - 5.11 MIL/uL Final  . Hemoglobin 01/22/2011 10.6* 12.0 - 15.0 g/dL Final  . HCT 01/22/2011 32.8* 36.0 - 46.0 % Final  . MCV 01/22/2011 87.2  78.0 - 100.0 fL Final  . MCH 01/22/2011 28.2  26.0 - 34.0 pg Final  . MCHC 01/22/2011 32.3  30.0 - 36.0 g/dL Final  . RDW 01/22/2011 14.2  11.5 - 15.5 % Final  .  Platelets 01/22/2011 240  150 - 400 K/uL Final  . Glucose-Capillary 01/22/2011 121* 70 - 99 mg/dL Final  . Glucose-Capillary 01/22/2011 130* 70 - 99 mg/dL Final  . Glucose-Capillary 01/22/2011 151* 70 - 99 mg/dL Final  .  Glucose-Capillary 01/22/2011 134* 70 - 99 mg/dL Final  . Comment 1 01/22/2011 Notify RN   Final  . Glucose-Capillary 01/23/2011 100* 70 - 99 mg/dL Final  . Comment 1 01/23/2011 Notify RN   Final  . Glucose-Capillary 01/23/2011 116* 70 - 99 mg/dL Final  . Glucose-Capillary 01/23/2011 123* 70 - 99 mg/dL Final  . Glucose-Capillary 01/23/2011 117* 70 - 99 mg/dL Final  . Glucose-Capillary 01/24/2011 83  70 - 99 mg/dL Final  . Comment 1 01/24/2011 Notify RN   Final  . Glucose-Capillary 01/24/2011 92  70 - 99 mg/dL Final    (this displays the last labs from the last 3 days)  No results found for: TOTALPROTELP, ALBUMINELP, A1GS, A2GS, BETS, BETA2SER, GAMS, MSPIKE, SPEI (this displays SPEP labs)  No results found for: KPAFRELGTCHN, LAMBDASER, KAPLAMBRATIO (kappa/lambda light chains)  No results found for: HGBA, HGBA2QUANT, HGBFQUANT, HGBSQUAN (Hemoglobinopathy evaluation)   No results found for: LDH  No results found for: IRON, TIBC, IRONPCTSAT (Iron and TIBC)  No results found for: FERRITIN  Urinalysis    Component Value Date/Time   COLORURINE STRAW (A) 03/13/2007 0601   APPEARANCEUR CLOUDY (A) 03/13/2007 0601   LABSPEC 1.020 03/13/2007 0601   PHURINE 7.5 03/13/2007 0601   GLUCOSEU NEGATIVE 03/13/2007 0601   HGBUR NEGATIVE 03/13/2007 0601   BILIRUBINUR NEGATIVE 03/13/2007 0601   KETONESUR >80 (A) 03/13/2007 0601   PROTEINUR NEGATIVE 03/13/2007 0601   UROBILINOGEN 0.2 03/13/2007 0601   NITRITE NEGATIVE 03/13/2007 0601   LEUKOCYTESUR  03/13/2007 0601    NEGATIVE MICROSCOPIC NOT DONE ON URINES WITH NEGATIVE PROTEIN, BLOOD, LEUKOCYTES, NITRITE, OR GLUCOSE <1000 mg/dL.     STUDIES: Mr Breast Bilateral W Wo Contrast  Addendum Date: 01/22/2017   ADDENDUM  REPORT: 01/22/2017 15:35 ADDENDUM: An addendum is made to the findings portion of the report due to a LEFT/ RIGHT type which should read as: Left breast: Extensive masslike and non masslike enhancement predominantly involving the upper inner LEFT breast and extending inferiorly to involve the entire central to lower left breast compatible with known malignancy. Biopsy clip artifact is present in the outer left breast at site of biopsied calcifications as well as inner left breast at site of biopsied mass. There is enlargement of the left breast as compared to the right with both skin and trabecular edema, however no abnormal enhancement of the skin or nipple-areola complex. There is no involvement of the pectoralis muscle. This mass cannot be discretely measured given the extensive nature of the disease, however spans approximately 12 cm in the AP dimension. Electronically Signed   By: Everlean Alstrom M.D.   On: 01/22/2017 15:35   Result Date: 01/22/2017 CLINICAL DATA:  46 year old female with recently diagnosed left breast cancer after presenting for diagnostic mammography for palpable abnormality 01/09/2017. Extensive calcifications were noted throughout the left breast as well as a suspicious mass in the left breast at the 9:30 position. Stereotactic guided biopsy of the calcifications in the lower outer left breast demonstrated high grade ductal carcinoma in situ and ultrasound-guided biopsy of the mass in the left breast at the 9:30 position demonstrated invasive ductal carcinoma. A biopsy left axillary lymph node was positive for metastatic disease. LABS:  Not applicable. EXAM: BILATERAL BREAST MRI WITH AND WITHOUT CONTRAST TECHNIQUE: Multiplanar, multisequence MR images of both breasts were obtained prior to and following the intravenous administration of 20 ml of MultiHance. THREE-DIMENSIONAL MR IMAGE RENDERING ON INDEPENDENT WORKSTATION: Three-dimensional MR images were rendered by post-processing of the  original MR  data on an independent workstation. The three-dimensional MR images were interpreted, and findings are reported in the following complete MRI report for this study. Three dimensional images were evaluated at the independent DynaCad workstation COMPARISON:  Previous exam(s). FINDINGS: Breast composition: c.  Heterogeneous fibroglandular tissue. Background parenchymal enhancement: Moderate. Right breast: No suspicious rapidly enhancing masses or abnormal areas of enhancement in the right breast to suggest malignancy. Left breast: Extensive masslike and non masslike enhancement predominantly involving the upper inner right breast and extending inferiorly to involve the entire central to lower left breast compatible with known malignancy. Biopsy clip artifact is present in the outer left breast at site of biopsied calcifications as well as inner left breast at site of biopsied mass. There is enlargement of the left breast as compared to the right with both skin and trabecular edema, however no abnormal enhancement of the skin or nipple-areola complex. There is no involvement of the pectoralis muscle. This mass cannot be discretely measured given the extensive nature of the disease, however spans approximately 12 cm in the AP dimension. Lymph nodes: There is bulky left axillary lymphadenopathy, with the largest left axillary lymph node measuring up to 3.4 cm. Ancillary findings: Subcentimeter nonenhancing T2 bright lesions are seen in the liver which may represent small cysts. IMPRESSION: 1. Extensive malignancy throughout the left breast with bulky left axillary lymphadenopathy. 2.  No MRI evidence of malignancy in the right breast. RECOMMENDATION: Treatment plan for known left breast malignancy. BI-RADS CATEGORY  6: Known biopsy-proven malignancy. Electronically Signed: By: Everlean Alstrom M.D. On: 01/22/2017 15:25   US Breast Ltd Uni Left Inc Axilla  Result Date: 01/09/2017 CLINICAL DATA:  Left breast  palpable area felt by the patient approximately 1 week ago. EXAM: 2D DIGITAL DIAGNOSTIC BILATERAL MAMMOGRAM WITH CAD AND ADJUNCT TOMO ULTRASOUND LEFT BREAST COMPARISON:  Previous exam(s). ACR Breast Density Category c: The breast tissue is heterogeneously dense, which may obscure small masses. FINDINGS: Mammographically, there are no suspicious masses, areas of architectural distortion or microcalcifications in the right breast. There is a regional asymmetry in the left central and upper inner breast, anterior and middle depth. Malignant type calcifications are seen involving all 4 quadrants of the breast and spanning the thickness of the breast with the most clustering in the upper inner quadrant, which corresponds to the area of palpable concern. There is anterior skin thickening and slight trabecular thickening. Mammographic images were processed with CAD. On physical exam, there is firmness and diffuse palpable thickening in the anterior left breast. Skin dimpling is also seen in the upper inner left breast, anterior depth. Targeted ultrasound is performed, showing left breast 930 o'clock 3 cm from the nipple, the area of palpable concern, irregular hypoechoic fragmented mass which measures 2.7 x 2.6 x 3.1 cm. In the left axilla there are 4 abnormal lymph nodes, the largest of which measures 2.7 cm in long axis, and demonstrates complete effacement of its hilum. 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 No mammographic evidence of malignancy in the right breast. Malignant left axillary lymphadenopathy. RECOMMENDATION: Ultrasound-guided core needle biopsy of left breast 930 o'clock mass and the largest abnormal left axillary lymph node. Stereotactic core needle biopsy of group of malignant appearing calcifications in the  lower outer left breast, to establish extent of disease. I have discussed the findings and recommendations with  the patient. Results were also provided in writing at the conclusion of the visit. If applicable, a reminder letter will be sent to the patient regarding the next appointment. BI-RADS CATEGORY  5: Highly suggestive of malignancy. Electronically Signed   By: Fidela Salisbury M.D.   On: 01/09/2017 09:19   Mm Diag Breast Tomo Bilateral  Result Date: 01/09/2017 CLINICAL DATA:  Left breast palpable area felt by the patient approximately 1 week ago. EXAM: 2D DIGITAL DIAGNOSTIC BILATERAL MAMMOGRAM WITH CAD AND ADJUNCT TOMO ULTRASOUND LEFT BREAST COMPARISON:  Previous exam(s). ACR Breast Density Category c: The breast tissue is heterogeneously dense, which may obscure small masses. FINDINGS: Mammographically, there are no suspicious masses, areas of architectural distortion or microcalcifications in the right breast. There is a regional asymmetry in the left central and upper inner breast, anterior and middle depth. Malignant type calcifications are seen involving all 4 quadrants of the breast and spanning the thickness of the breast with the most clustering in the upper inner quadrant, which corresponds to the area of palpable concern. There is anterior skin thickening and slight trabecular thickening. Mammographic images were processed with CAD. On physical exam, there is firmness and diffuse palpable thickening in the anterior left breast. Skin dimpling is also seen in the upper inner left breast, anterior depth. Targeted ultrasound is performed, showing left breast 930 o'clock 3 cm from the nipple, the area of palpable concern, irregular hypoechoic fragmented mass which measures 2.7 x 2.6 x 3.1 cm. In the left axilla there are 4 abnormal lymph nodes, the largest of which measures 2.7 cm in long axis, and demonstrates complete effacement of its hilum. 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 No mammographic evidence of malignancy in the right breast. Malignant left axillary lymphadenopathy. RECOMMENDATION: Ultrasound-guided core needle biopsy of left breast 930 o'clock mass and the largest abnormal left axillary lymph node. Stereotactic core needle biopsy of group of malignant appearing calcifications in the lower outer left breast, to establish extent of disease. I have discussed the findings and recommendations with the patient. Results were also provided in writing at the conclusion of the visit. If applicable, a reminder letter will be sent to the patient regarding the next appointment. BI-RADS CATEGORY  5: Highly suggestive of malignancy. Electronically Signed   By: Fidela Salisbury M.D.   On: 01/09/2017 09:19   [name] Axillary Node Core Biopsy Left  Result Date: 01/14/2017 CLINICAL DATA:  Patient with cortically thickened left axillary lymph node. EXAM: ULTRASOUND GUIDED CORE NEEDLE BIOPSY OF A LEFT AXILLARY NODE COMPARISON:  Previous exam(s). FINDINGS: I met with the patient and we discussed the procedure of ultrasound-guided biopsy, including benefits and alternatives. We discussed the high likelihood of a successful procedure. We discussed the risks of the procedure, including infection, bleeding, tissue injury, clip migration, and inadequate sampling. Informed written consent was given. The usual time-out protocol was performed immediately prior to the procedure. Using sterile technique and 1% Lidocaine as local anesthetic, under direct ultrasound visualization, a 14 gauge spring-loaded device was used to perform biopsy of left axillary lymph node using a lateral approach. At the conclusion of the procedure a HydroMARK tissue marker clip was deployed into the biopsy cavity. Follow up 2 view mammogram was  performed and dictated separately. IMPRESSION: Ultrasound guided biopsy of left axillary lymph node. No apparent complications. Electronically  Signed   By: Lovey Newcomer M.D.   On: 01/14/2017 12:53   Mm Clip Placement Left  Result Date: 01/14/2017 CLINICAL DATA:  Patient status post ultrasound-guided biopsy palpable left breast mass, stereotactic guided biopsy left breast calcifications and ultrasound-guided biopsy left axillary lymph node. EXAM: DIAGNOSTIC LEFT MAMMOGRAM POST ULTRASOUND AND STEREOTACTIC BIOPSIES COMPARISON:  Previous exam(s). FINDINGS: Mammographic images were obtained following ultrasound guided biopsy of palpable left breast mass 930 o'clock and left axillary lymph node as well as stereotactic guided biopsy left breast calcifications. Site 1: Calcifications lower outer left breast. Coil shaped marking clip in appropriate position. Site 2: Palpable mass 930 o'clock. Ribbon shaped marking clip in appropriate position. Site 3: Left axillary lymph node.  HydroMARK clip in appropriate position. IMPRESSION: Site 1: Calcifications lower outer left breast. Coil shaped marking clip in appropriate position. Site 2: Palpable mass 930 o'clock. Ribbon shaped marking clip in appropriate position. Site 3: Left axillary lymph node. HydroMARK clip in appropriate position. Final Assessment: Post Procedure Mammograms for Marker Placement Electronically Signed   By: Lovey Newcomer M.D.   On: 01/14/2017 13:00   Mm Lt Breast Bx W Loc Dev 1st Lesion Image Bx Spec Stereo Guide  Result Date: 01/14/2017 CLINICAL DATA:  Patient with suspicious left breast calcifications. EXAM: LEFT BREAST STEREOTACTIC CORE NEEDLE BIOPSY COMPARISON:  Previous exams. FINDINGS: The patient and I discussed the procedure of stereotactic-guided biopsy including benefits and alternatives. We discussed the high likelihood of a successful procedure. We discussed the risks of the procedure including infection, bleeding, tissue injury, clip  migration, and inadequate sampling. Informed written consent was given. The usual time out protocol was performed immediately prior to the procedure. Using sterile technique and 1% Lidocaine as local anesthetic, under stereotactic guidance, a 9 gauge vacuum assisted device was used to perform core needle biopsy of calcifications in the lower outer left breast using a cranial approach. Specimen radiograph was performed showing calcifications. Specimens with calcifications are identified for pathology. Lesion quadrant: Lower outer quadrant At the conclusion of the procedure, a coil shaped tissue marker clip was deployed into the biopsy cavity. Follow-up 2-view mammogram was performed and dictated separately. IMPRESSION: Stereotactic-guided biopsy of left breast calcifications. No apparent complications. Electronically Signed   By: Lovey Newcomer M.D.   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 GRADE I-II INVASIVE DUCTAL CARCINOMA WITH EXTRACELLULAR MUCIN of the Left breast, 9:30 o'clock. METASTATIC CARCINOMA IN ONE LYMPH NODE of the Left axilla. This was found to be concordant by Dr. Lovey Newcomer. Pathology results were discussed with the patient by telephone. The patient reported doing well after the biopsies with tenderness at the sites. Post biopsy instructions and care were reviewed and questions were answered. The patient was encouraged to call [facility] for any additional concerns. Surgical consultation has been arranged with Dr. Erroll [name] at [name] on January 21, 2017. Recommendation for bilateral breast MRI to evaluate extent of disease. Pathology results reported by Terie Purser, RN on 01/15/2017. Electronically Signed   By: Lovey Newcomer M.D.    On: 01/15/2017 14:45   Result Date: 01/16/2017 CLINICAL DATA:  Patient with suspicious palpable left breast mass. EXAM: ULTRASOUND GUIDED LEFT BREAST CORE NEEDLE BIOPSY COMPARISON:  Previous exam(s). FINDINGS:  I met with the patient and we discussed the procedure of ultrasound-guided biopsy, including benefits and alternatives. We discussed the high likelihood of a successful procedure. We discussed the risks of the procedure, including infection, bleeding, tissue injury, clip migration, and inadequate sampling. Informed written consent was given. The usual time-out protocol was performed immediately prior to the procedure. Lesion quadrant: Upper inner quadrant Using sterile technique and 1% Lidocaine as local anesthetic, under direct ultrasound visualization, a 12 gauge spring-loaded device was used to perform biopsy of left breast mass 930 o'clock using a medial approach. At the conclusion of the procedure a ribbon shaped tissue marker clip was deployed into the biopsy cavity. Follow up 2 view mammogram was performed and dictated separately. IMPRESSION: Ultrasound guided biopsy of left breast mass 930 o'clock. No apparent complications. Electronically Signed: By: Lovey Newcomer M.D. On: 01/14/2017 12:56    ELIGIBLE FOR AVAILABLE RESEARCH PROTOCOL: no  ASSESSMENT: 47 y.o. 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  (1) staging studies pending  (2) neoadjuvant chemotherapy will consist of cyclophosphamide and doxorubicin in dose dense fashion 4 followed by weekly paclitaxel 12  (3) definitive surgery to follow, most likely left modified radical mastectomy  (4) postmastectomy radiation is indicated  (5) adjuvant anti-estrogens to follow  (6) genetics testing  PLAN: We spent the better part of today's  hour-long appointment discussing the biology of her diagnosis and the specifics of her situation. We first reviewed the fact that cancer is not one disease but more than 100 different diseases and that it is important to keep them separate-- otherwise when friends and relatives discuss their own cancer experiences with Mackenzie [name] confusion can result. Similarly we explained that if breast cancer spreads to the bone or liver, the patient would not have bone cancer or liver cancer, but breast cancer in the bone and breast cancer in the liver: one cancer in three places-- not 3 different cancers which otherwise would have to be treated in 3 different ways.  We discussed the difference between local and systemic therapy. In terms of loco-regional treatment, lumpectomy plus radiation is equivalent to mastectomy as far as survival is concerned. For this reason, and because the cosmetic results are generally superior, we recommend breast conserving surgery. We also noted that in terms of sequencing of treatments, whether systemic therapy or surgery is done first does not affect the ultimate outcome. This is particularly relevant to Mackenzie [name]'s situation, since with locally advanced disease we feel starting with chemotherapy will make her surgery easier.    We then discussed the rationale for systemic therapy. There is some risk that this cancer may have already spread to other parts of her body. With stage III disease, as is the case here (using anatomic staging) it is prudent to proceed to scans and I am setting Mackenzie [name] up for a CT of the chest, bone scan and brain MRI. Of course she will also had an echocardiogram before the start of chemotherapy.   Next we went over the options for systemic therapy which are anti-estrogens, anti-HER-2 immunotherapy, and chemotherapy. Mackenzie [name] does not meet criteria for anti-HER-2 immunotherapy. She is a good candidate for anti-estrogens.  The question of chemotherapy is more  complicated. Chemotherapy is most effective in rapidly growing, aggressive tumors. It is less effective in lower-grade, slower growing cancers, like Mackenzie [name] '  s. However, with 4 abnormal lymph nodes and stage III disease, she does not qualify for Mammaprint or Oncotype and chemotherapy is mandatory. She understands with estrogen receptor positive tumors the chance of obtaining a complete pathologic response is low, but the real pain of chemotherapy is to sterilize peripheral disease and there is no question that chemotherapy improves survival in these situations.   More specifically, she will receive cyclophosphamide and doxorubicin in dose dense fashion 4, followed by weekly paclitaxel 12.. We are hoping to start 02/10/2017. We are asking Dr. Brantley Stage to place a port before that date if possible. She will return to see me August 31 and by then we should have all the study results and I will review with her to take her supportive medications.  Inaya has a good understanding of the overall plan. She agrees with it. She will call with any problems that may develop before her next visit here.  Mackenzie Cruel, MD   01/23/2017 4:53 PM Medical Oncology and Hematology [name] [address] [place], [place] [address] Tel. [phone]    Fax. [phone]

## 2017-01-23 NOTE — Progress Notes (Signed)
START ON PATHWAY REGIMEN - Breast   Dose-Dense AC q14 days:   A cycle is every 14 days:     Doxorubicin      Cyclophosphamide      Pegfilgrastim   **Always confirm dose/schedule in your pharmacy ordering system**    Paclitaxel 80 mg/m2 Weekly:   Administer weekly:     Paclitaxel   **Always confirm dose/schedule in your pharmacy ordering system**    Patient Characteristics: Preoperative or Nonsurgical Candidate (Clinical Staging), Neoadjuvant Therapy followed by Surgery, Invasive Disease, Chemotherapy, HER2 Negative/Unknown/Equivocal, ER Positive Therapeutic Status: Preoperative or Nonsurgical Candidate (Clinical Staging) AJCC M Category: cM0 AJCC Grade: G2 Breast Surgical Plan: Neoadjuvant Therapy followed by Surgery ER Status: Positive (+) AJCC 8 Stage Grouping: IIA HER2 Status: Negative (-) AJCC T Category: cT3 AJCC N Category: cN2 PR Status: Positive (+) Intent of Therapy: Curative Intent, Not Discussed with Patient

## 2017-01-26 ENCOUNTER — Encounter: Payer: Self-pay | Admitting: Radiation Oncology

## 2017-01-26 ENCOUNTER — Telehealth: Payer: Self-pay | Admitting: Oncology

## 2017-01-26 ENCOUNTER — Encounter (HOSPITAL_BASED_OUTPATIENT_CLINIC_OR_DEPARTMENT_OTHER): Payer: Self-pay | Admitting: *Deleted

## 2017-01-26 NOTE — Telephone Encounter (Signed)
Called and LVM for patient to call back and schedule her echo.

## 2017-01-26 NOTE — Progress Notes (Signed)
Location of Breast Cancer:Left Breast Lower outer Quadrant( Metastatic)  Histology per Pathology Report: 01/14/2017: 1. Breast, left, needle core biopsy, lower outer - DUCTAL CARCINOMA IN SITU WITH CALCIFICATIONS, SEE COMMENT. 2. Breast, left, needle core biopsy, 9:30 o'clock - INVASIVE DUCTAL CARCINOMA WITH EXTRACELLULAR MUCIN, SEE COMMENT. 3. Lymph node, needle/core biopsy, axilla - METASTATIC CARCINOMA IN ONE LYMPH NODE (1/1).  Receptor Status: ER(100%), PR (60%), Her2-neu(-neg ratio=1.38 ), Ki-67(20%)  Did patient present with symptoms (if so, please note symptoms) or was this found on screening mammography?: patient found changes in left breast  Past/Anticipated interventions by surgeon, if any:01/28/2017 port a cath implant scheduled, Dr. Jeannene Patella, MD most likely left modified radiacal mastectomy, f/u 02/06/17 to start chemotherapy  Past/Anticipated interventions by medical oncology, if any: Chemotherapy :Dr. Jana Hakim, MD sen on 01/23/17 Neoadjuvant chemotherapy pending ,genetic testing,   Lymphedema issues, if any:  NO Pain issues, if any: persistent head aches off and on, sharppains intermittent of where bx left breast  SAFETY ISSUES:NO  Prior radiation? NO  Pacemaker/ICD? NO  Possible current pregnancy? No, still having regular menses 01/11/17   Is the patient on methotrexate? NO  Current Complaints / other details: Married,  Works [employer],  Menarche age 75,G6P4, first live birth age 61, last menses 12/25/16;  no tobacco use,no drug use, occasional alcohol use, DM, Mother-CVA,heart disease,HTN,Seizure disorder,deceased brain tumor  Benign age 63, Father  Age 87 living,HTN,Kidney disease,ETOH ,Paternal grandmothr breast cancer,  Allergies: NKA Ht '5\' 6"'$  (1.676 m)   Wt 279 lb 6.4 oz (126.7 kg)   LMP 01/09/2017   BMI 45.10 kg/m   Wt Readings from Last 3 Encounters:  01/27/17 279 lb 6.4 oz (126.7 kg)  01/23/17 281 lb 4.8 oz (127.6 kg)  01/08/17 278 lb (126.1  kg)      Rebecca Eaton, RN 01/26/2017,7:56 AM

## 2017-01-27 ENCOUNTER — Encounter (HOSPITAL_BASED_OUTPATIENT_CLINIC_OR_DEPARTMENT_OTHER)
Admission: RE | Admit: 2017-01-27 | Discharge: 2017-01-27 | Disposition: A | Discharge status: Home / Self Care | Payer: 59 | Source: Ambulatory Visit | Attending: Surgery | Admitting: Surgery

## 2017-01-27 ENCOUNTER — Ambulatory Visit
Admission: RE | Admit: 2017-01-27 | Discharge: 2017-01-27 | Disposition: A | Discharge status: Home / Self Care | Payer: 59 | Source: Ambulatory Visit | Attending: Radiation Oncology | Admitting: Radiation Oncology

## 2017-01-27 VITALS — Ht 66.0 in | Wt 279.4 lb

## 2017-01-27 DIAGNOSIS — Z170 Estrogen receptor positive status [ER+]: Secondary | ICD-10-CM | POA: Diagnosis not present

## 2017-01-27 DIAGNOSIS — C50812 Malignant neoplasm of overlapping sites of left female breast: Secondary | ICD-10-CM | POA: Diagnosis not present

## 2017-01-27 DIAGNOSIS — Z803 Family history of malignant neoplasm of breast: Secondary | ICD-10-CM | POA: Diagnosis not present

## 2017-01-27 DIAGNOSIS — E119 Type 2 diabetes mellitus without complications: Secondary | ICD-10-CM | POA: Diagnosis not present

## 2017-01-27 DIAGNOSIS — C50312 Malignant neoplasm of lower-inner quadrant of left female breast: Secondary | ICD-10-CM

## 2017-01-27 DIAGNOSIS — C50912 Malignant neoplasm of unspecified site of left female breast: Secondary | ICD-10-CM | POA: Diagnosis not present

## 2017-01-27 DIAGNOSIS — D0512 Intraductal carcinoma in situ of left breast: Secondary | ICD-10-CM | POA: Diagnosis not present

## 2017-01-27 DIAGNOSIS — D649 Anemia, unspecified: Secondary | ICD-10-CM

## 2017-01-27 DIAGNOSIS — K219 Gastro-esophageal reflux disease without esophagitis: Secondary | ICD-10-CM | POA: Diagnosis not present

## 2017-01-27 DIAGNOSIS — Z809 Family history of malignant neoplasm, unspecified: Secondary | ICD-10-CM

## 2017-01-27 DIAGNOSIS — C773 Secondary and unspecified malignant neoplasm of axilla and upper limb lymph nodes: Secondary | ICD-10-CM | POA: Diagnosis not present

## 2017-01-27 HISTORY — DX: Malignant neoplasm of unspecified site of unspecified female breast: C50.919

## 2017-01-27 HISTORY — DX: Anemia, unspecified: D64.9

## 2017-01-27 LAB — CBC WITH DIFFERENTIAL/PLATELET
BASOS ABS: 0 10*3/uL (ref 0.0–0.1)
BASOS PCT: 0 %
Eosinophils Absolute: 0.1 10*3/uL (ref 0.0–0.7)
Eosinophils Relative: 1 %
HEMATOCRIT: 36.9 % (ref 36.0–46.0)
Hemoglobin: 11.9 g/dL — ABNORMAL LOW (ref 12.0–15.0)
Lymphocytes Relative: 24 %
Lymphs Abs: 2.2 10*3/uL (ref 0.7–4.0)
MCH: 25.3 pg — ABNORMAL LOW (ref 26.0–34.0)
MCHC: 32.2 g/dL (ref 30.0–36.0)
MCV: 78.3 fL (ref 78.0–100.0)
MONO ABS: 1 10*3/uL (ref 0.1–1.0)
Monocytes Relative: 11 %
NEUTROS ABS: 5.8 10*3/uL (ref 1.7–7.7)
NEUTROS PCT: 64 %
Platelets: 299 10*3/uL (ref 150–400)
RBC: 4.71 MIL/uL (ref 3.87–5.11)
RDW: 15 % (ref 11.5–15.5)
WBC: 9.1 10*3/uL (ref 4.0–10.5)

## 2017-01-27 LAB — COMPREHENSIVE METABOLIC PANEL
ALBUMIN: 3.9 g/dL (ref 3.5–5.0)
ALT: 21 U/L (ref 14–54)
AST: 22 U/L (ref 15–41)
Alkaline Phosphatase: 86 U/L (ref 38–126)
Anion gap: 6 (ref 5–15)
BILIRUBIN TOTAL: 0.5 mg/dL (ref 0.3–1.2)
BUN: 12 mg/dL (ref 6–20)
CHLORIDE: 104 mmol/L (ref 101–111)
CO2: 25 mmol/L (ref 22–32)
Calcium: 9.3 mg/dL (ref 8.9–10.3)
Creatinine, Ser: 0.76 mg/dL (ref 0.44–1.00)
GFR calc Af Amer: >60 mL/min (ref >60)
GFR calc non Af Amer: >60 mL/min (ref >60)
GLUCOSE: 88 mg/dL (ref 65–99)
POTASSIUM: 4.1 mmol/L (ref 3.5–5.1)
Sodium: 135 mmol/L (ref 135–145)
TOTAL PROTEIN: 7.5 g/dL (ref 6.5–8.1)

## 2017-01-27 NOTE — Progress Notes (Signed)
Radiation Oncology         [phone] ________________________________  Name: Mackenzie [name]        MRN: [id number]  Date of Service: 01/27/2017 DOB: 1971-01-13  AC:ZYSAYTK, Joelene Millin, MD  Erroll Luna, MD     REFERRING PHYSICIAN: Erroll Luna, MD   DIAGNOSIS: The primary encounter diagnosis was Malignant 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, estrogen receptor positive ([place]). A diagnosis of Malignant neoplasm of lower-inner quadrant of left breast in female, estrogen receptor positive ([place]) was also pertinent to this visit.   HISTORY OF PRESENT ILLNESS: Mackenzie [name] is a 46 y.o. female seen at the request of Dr. Brantley Stage for a new diagnosis of left breast cancer. The patient self palpated a mass in the left breast at the beginning of August. She was seen in the ED and set up for diagnostic imaging including tomo mammogram and left breast ultrasound. Assymetry and calcifications were noted in all quadrants of the left breast, clustering mostly in the upper inner quadrant. There was slight trabecular thickening, and ultrasound revealed a mass at 9:30 o'clock measuring 2.7 x 2.6 x 3.1 cm and there were 4 abnormal nodes in the left axilla. 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 The largest axillary node was also biopsied and consistent with metastatic disease. Bilateral breast MRI on 01/22/17 revealed extensive mass like and non masslike enhancmenet of the upper inner left breast involving the entire central to lower left breast with leargement of the left breast with skin edema, however no enhancement wof the nipple areola complex was seen. The span of the mass is measured at 12 cm in the AP dimension. She has met with Dr. Jana Hakim who has recommended neoadjuvant chemotherapy  and genetic counseling. She's scheduled to start chemotherapy on 02/10/17, and genetics on 02/17/17. She's hoping for a complete response so she can have breast conservation surgery. She comes today to discuss adjuvant radiotherapy.   PREVIOUS RADIATION THERAPY: No   PAST MEDICAL HISTORY:  Past Medical History:  Diagnosis Date  . Anemia   . Breast cancer ([place]) 01/14/2017   Left breast  . Diabetes mellitus    GDM       PAST SURGICAL HISTORY: Past Surgical History:  Procedure Laterality Date  . CESAREAN SECTION     x2  . TUBAL LIGATION       FAMILY HISTORY:  Family History  Problem Relation Age of Onset  . Breast cancer Paternal Grandmother      SOCIAL HISTORY:  reports that she has never smoked. She has never used smokeless tobacco. She reports that she drinks alcohol. She reports that she does not use drugs.   ALLERGIES: Patient has no known allergies.   MEDICATIONS:  Current Outpatient Prescriptions  Medication Sig Dispense Refill  . Ascorbic Acid (VITAMIN C) 100 MG tablet Take 100 mg by mouth daily.    . benzonatate (TESSALON) 100 MG capsule Take 100 mg by mouth as needed. For cough    . cetirizine (ZYRTEC) 10 MG tablet Take 10 mg by mouth daily.      . cholecalciferol (VITAMIN D) 1000 units tablet Take 1,000 Units by mouth daily.    . Evening Primrose Oil 500 MG CAPS Take 500 mg by mouth 2 (two) times daily. With meals    .  Flaxseed Oil OIL by Does not apply route.    Marland Kitchen HYDROcodone-homatropine (HYCODAN) 5-1.5 MG/5ML syrup Take 5 mLs by mouth 3 (three) times daily as needed.    . montelukast (SINGULAIR) 10 MG tablet Take 10 mg by mouth at bedtime. prn    . Omega-3 Fatty Acids (FISH OIL) 1000 MG CAPS Take 1,000 mg by mouth 2 (two) times daily.     . Turmeric 500 MG CAPS Take 500 mg by mouth daily.      No current facility-administered medications for this encounter.      REVIEW OF SYSTEMS: On review of systems, the patient reports that she is doing well overall.  She denies any chest pain, shortness of breath, cough, fevers, chills, night sweats, unintended weight changes. She denies any pain in the breast currently but states she has had this off and on for a few weeks. She denies nipple bleeding or discharge. She  denies any bowel or bladder disturbances, and denies abdominal pain, nausea or vomiting. She denies any new musculoskeletal or joint aches or pains. A complete review of systems is obtained and is otherwise negative.     PHYSICAL EXAM:  Wt Readings from Last 3 Encounters:  01/27/17 279 lb 6.4 oz (126.7 kg)  01/23/17 281 lb 4.8 oz (127.6 kg)  01/08/17 278 lb (126.1 kg)   Temp Readings from Last 3 Encounters:  01/23/17 98.2 F (36.8 C) (Oral)  01/23/11 98.4 F (36.9 C) (Oral)   BP Readings from Last 3 Encounters:  01/23/17 (!) 155/72  01/08/17 (!) 145/78  01/23/11 130/79   Pulse Readings from Last 3 Encounters:  01/23/17 (!) 101  01/08/17 80  01/23/11 83   Pain Assessment Pain Score: 0-No pain/10  In general this is a well appearing African American female in no acute distress. She is alert and oriented x4 and appropriate throughout the examination. HEENT reveals that the patient is normocephalic, atraumatic. EOMs are intact. PERRLA. Skin is intact without any evidence of gross lesions. Cardiovascular exam reveals a regular rate and rhythm, no clicks rubs or murmurs are auscultated. Chest is clear to auscultation bilaterally. Lymphatic assessment is performed and does not reveal any adenopathy in the cervical, supraclavicular, or inguinal chains. Right axillary nodes are intact without adenopathy however there is fullness in the left axilla consistent with her known adenopathy. Bilateral breast exam reveals  Abdomen has active bowel sounds in all quadrants and is intact. The abdomen is soft, non tender, non distended. Lower extremities are negative for pretibial pitting edema, deep calf tenderness, cyanosis or clubbing.   ECOG =  1  0 - Asymptomatic (Fully active, able to carry on all predisease activities without restriction)  1 - Symptomatic but completely ambulatory (Restricted in physically strenuous activity but ambulatory and able to carry out work of a light or sedentary nature. For example, light housework, office work)  2 - Symptomatic, <50% in bed during the day (Ambulatory and capable of all self care but unable to carry out any work activities. Up and about more than 50% of waking hours)  3 - Symptomatic, >50% in bed, but not bedbound (Capable of only limited self-care, confined to bed or chair 50% or more of waking hours)  4 - Bedbound (Completely disabled. Cannot carry on any self-care. Totally confined to bed or chair)  5 - Death   Eustace Pen MM, Creech RH, Tormey DC, et al. [phone]). "Toxicity and response criteria of the [facility]". [place] Oncol. 5 (6):  408-14    LABORATORY DATA:  Lab Results  Component Value Date   WBC 9.3 01/22/2011   HGB 10.6 (L) 01/22/2011   HCT 32.8 (L) 01/22/2011   MCV 87.2 01/22/2011   PLT 240 01/22/2011   Lab Results  Component Value Date   NA 131 (L) 01/15/2011   K 3.8 01/15/2011   CL 100 01/15/2011   CO2 22 01/15/2011   Lab Results  Component Value Date   ALT 33 03/13/2007   AST 23 03/13/2007   ALKPHOS 71 03/13/2007   BILITOT 0.8 03/13/2007      RADIOGRAPHY: Mr Breast Bilateral W Wo Contrast  Addendum Date: 01/22/2017   ADDENDUM REPORT: 01/22/2017 15:35 ADDENDUM: An addendum is made to the findings portion of the report due to a LEFT/ RIGHT type which should read as: Left breast: Extensive masslike and non masslike enhancement predominantly involving the upper inner LEFT breast and extending inferiorly to involve the entire central to lower left breast compatible with known malignancy. Biopsy clip artifact is present in the outer left breast at site of biopsied calcifications as well as inner left breast at site of biopsied mass.  There is enlargement of the left breast as compared to the right with both skin and trabecular edema, however no abnormal enhancement of the skin or nipple-areola complex. There is no involvement of the pectoralis muscle. This mass cannot be discretely measured given the extensive nature of the disease, however spans approximately 12 cm in the AP dimension. Electronically Signed   By: Everlean Alstrom M.D.   On: 01/22/2017 15:35   Result Date: 01/22/2017 CLINICAL DATA:  46 year old female with recently diagnosed left breast cancer after presenting for diagnostic mammography for palpable abnormality 01/09/2017. Extensive calcifications were noted throughout the left breast as well as a suspicious mass in the left breast at the 9:30 position. Stereotactic guided biopsy of the calcifications in the lower outer left breast demonstrated high grade ductal carcinoma in situ and ultrasound-guided biopsy of the mass in the left breast at the 9:30 position demonstrated invasive ductal carcinoma. A biopsy left axillary lymph node was positive for metastatic disease. LABS:  Not applicable. EXAM: BILATERAL BREAST MRI WITH AND WITHOUT CONTRAST TECHNIQUE: Multiplanar, multisequence MR images of both breasts were obtained prior to and following the intravenous administration of 20 ml of MultiHance. THREE-DIMENSIONAL MR IMAGE RENDERING ON INDEPENDENT WORKSTATION: Three-dimensional MR images were rendered by post-processing of the original MR data on an independent workstation. The three-dimensional MR images were interpreted, and findings are reported in the following complete MRI report for this study. Three dimensional images were evaluated at the independent DynaCad workstation COMPARISON:  Previous exam(s). FINDINGS: Breast composition: c.  Heterogeneous fibroglandular tissue. Background parenchymal enhancement: Moderate. Right breast: No suspicious rapidly enhancing masses or abnormal areas of enhancement in the right breast  to suggest malignancy. Left breast: Extensive masslike and non masslike enhancement predominantly involving the upper inner right breast and extending inferiorly to involve the entire central to lower left breast compatible with known malignancy. Biopsy clip artifact is present in the outer left breast at site of biopsied calcifications as well as inner left breast at site of biopsied mass. There is enlargement of the left breast as compared to the right with both skin and trabecular edema, however no abnormal enhancement of the skin or nipple-areola complex. There is no involvement of the pectoralis muscle. This mass cannot be discretely measured given the extensive nature of the disease, however spans approximately 12 cm in  the AP dimension. Lymph nodes: There is bulky left axillary lymphadenopathy, with the largest left axillary lymph node measuring up to 3.4 cm. Ancillary findings: Subcentimeter nonenhancing T2 bright lesions are seen in the liver which may represent small cysts. IMPRESSION: 1. Extensive malignancy throughout the left breast with bulky left axillary lymphadenopathy. 2.  No MRI evidence of malignancy in the right breast. RECOMMENDATION: Treatment plan for known left breast malignancy. BI-RADS CATEGORY  6: Known biopsy-proven malignancy. Electronically Signed: By: Edwin Cap M.D. On: 01/22/2017 15:25   US Breast Ltd Uni Left Inc Axilla  Result Date: 01/09/2017 CLINICAL DATA:  Left breast palpable area felt by the patient approximately 1 week ago. EXAM: 2D DIGITAL DIAGNOSTIC BILATERAL MAMMOGRAM WITH CAD AND ADJUNCT TOMO ULTRASOUND LEFT BREAST COMPARISON:  Previous exam(s). ACR Breast Density Category c: The breast tissue is heterogeneously dense, which may obscure small masses. FINDINGS: Mammographically, there are no suspicious masses, areas of architectural distortion or microcalcifications in the right breast. There is a regional asymmetry in the left central and upper inner breast,  anterior and middle depth. Malignant type calcifications are seen involving all 4 quadrants of the breast and spanning the thickness of the breast with the most clustering in the upper inner quadrant, which corresponds to the area of palpable concern. There is anterior skin thickening and slight trabecular thickening. Mammographic images were processed with CAD. On physical exam, there is firmness and diffuse palpable thickening in the anterior left breast. Skin dimpling is also seen in the upper inner left breast, anterior depth. Targeted ultrasound is performed, showing left breast 930 o'clock 3 cm from the nipple, the area of palpable concern, irregular hypoechoic fragmented mass which measures 2.7 x 2.6 x 3.1 cm. In the left axilla there are 4 abnormal lymph nodes, the largest of which measures 2.7 cm in long axis, and demonstrates complete effacement of its hilum. 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 No mammographic evidence of malignancy in the right breast. Malignant left axillary lymphadenopathy. RECOMMENDATION: Ultrasound-guided core needle biopsy of left breast 930 o'clock mass and the largest abnormal left axillary lymph node. Stereotactic core needle biopsy of group of malignant appearing calcifications in the lower outer left breast, to establish extent of disease. I have discussed the findings and recommendations with the patient. Results were also provided in writing at the conclusion of the visit. If applicable, a reminder letter will be sent to the patient regarding the next appointment. BI-RADS CATEGORY  5: Highly suggestive of malignancy. Electronically Signed   By: Ted Mcalpine M.D.   On: 01/09/2017 09:19   Mm Diag Breast Tomo Bilateral  Result Date: 01/09/2017 CLINICAL DATA:  Left breast  palpable area felt by the patient approximately 1 week ago. EXAM: 2D DIGITAL DIAGNOSTIC BILATERAL MAMMOGRAM WITH CAD AND ADJUNCT TOMO ULTRASOUND LEFT BREAST COMPARISON:  Previous exam(s). ACR Breast Density Category c: The breast tissue is heterogeneously dense, which may obscure small masses. FINDINGS: Mammographically, there are no suspicious masses, areas of architectural distortion or microcalcifications in the right breast. There is a regional asymmetry in the left central and upper inner breast, anterior and middle depth. Malignant type calcifications are seen involving all 4 quadrants of the breast and spanning the thickness of the breast with the most clustering in the upper inner quadrant, which corresponds to the area of palpable concern.  There is anterior skin thickening and slight trabecular thickening. Mammographic images were processed with CAD. On physical exam, there is firmness and diffuse palpable thickening in the anterior left breast. Skin dimpling is also seen in the upper inner left breast, anterior depth. Targeted ultrasound is performed, showing left breast 930 o'clock 3 cm from the nipple, the area of palpable concern, irregular hypoechoic fragmented mass which measures 2.7 x 2.6 x 3.1 cm. In the left axilla there are 4 abnormal lymph nodes, the largest of which measures 2.7 cm in long axis, and demonstrates complete effacement of its hilum. 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 No mammographic evidence of malignancy in the right breast. Malignant left axillary lymphadenopathy. RECOMMENDATION: Ultrasound-guided core needle biopsy of left breast 930 o'clock mass and the largest abnormal left axillary lymph node. Stereotactic core needle biopsy of group of malignant appearing calcifications in the  lower outer left breast, to establish extent of disease. I have discussed the findings and recommendations with the patient. Results were also provided in writing at the conclusion of the visit. If applicable, a reminder letter will be sent to the patient regarding the next appointment. BI-RADS CATEGORY  5: Highly suggestive of malignancy. Electronically Signed   By: Fidela Salisbury M.D.   On: 01/09/2017 09:19   [name] Axillary Node Core Biopsy Left  Result Date: 01/14/2017 CLINICAL DATA:  Patient with cortically thickened left axillary lymph node. EXAM: ULTRASOUND GUIDED CORE NEEDLE BIOPSY OF A LEFT AXILLARY NODE COMPARISON:  Previous exam(s). FINDINGS: I met with the patient and we discussed the procedure of ultrasound-guided biopsy, including benefits and alternatives. We discussed the high likelihood of a successful procedure. We discussed the risks of the procedure, including infection, bleeding, tissue injury, clip migration, and inadequate sampling. Informed written consent was given. The usual time-out protocol was performed immediately prior to the procedure. Using sterile technique and 1% Lidocaine as local anesthetic, under direct ultrasound visualization, a 14 gauge spring-loaded device was used to perform biopsy of left axillary lymph node using a lateral approach. At the conclusion of the procedure a HydroMARK tissue marker clip was deployed into the biopsy cavity. Follow up 2 view mammogram was performed and dictated separately. IMPRESSION: Ultrasound guided biopsy of left axillary lymph node. No apparent complications. Electronically Signed   By: Lovey Newcomer M.D.   On: 01/14/2017 12:53   Mm Clip Placement Left  Result Date: 01/14/2017 CLINICAL DATA:  Patient status post ultrasound-guided biopsy palpable left breast mass, stereotactic guided biopsy left breast calcifications and ultrasound-guided biopsy left axillary lymph node. EXAM: DIAGNOSTIC LEFT MAMMOGRAM POST ULTRASOUND AND STEREOTACTIC  BIOPSIES COMPARISON:  Previous exam(s). FINDINGS: Mammographic images were obtained following ultrasound guided biopsy of palpable left breast mass 930 o'clock and left axillary lymph node as well as stereotactic guided biopsy left breast calcifications. Site 1: Calcifications lower outer left breast. Coil shaped marking clip in appropriate position. Site 2: Palpable mass 930 o'clock. Ribbon shaped marking clip in appropriate position. Site 3: Left axillary lymph node.  HydroMARK clip in appropriate position. IMPRESSION: Site 1: Calcifications lower outer left breast. Coil shaped marking clip in appropriate position. Site 2: Palpable mass 930 o'clock. Ribbon shaped marking clip in appropriate position. Site 3: Left axillary lymph node. HydroMARK clip in appropriate position. Final Assessment: Post Procedure Mammograms for Marker Placement Electronically Signed  By: Lovey Newcomer M.D.   On: 01/14/2017 13:00   Mm Lt Breast Bx W Loc Dev 1st Lesion Image Bx Spec Stereo Guide  Result Date: 01/14/2017 CLINICAL DATA:  Patient with suspicious left breast calcifications. EXAM: LEFT BREAST STEREOTACTIC CORE NEEDLE BIOPSY COMPARISON:  Previous exams. FINDINGS: The patient and I discussed the procedure of stereotactic-guided biopsy including benefits and alternatives. We discussed the high likelihood of a successful procedure. We discussed the risks of the procedure including infection, bleeding, tissue injury, clip migration, and inadequate sampling. Informed written consent was given. The usual time out protocol was performed immediately prior to the procedure. Using sterile technique and 1% Lidocaine as local anesthetic, under stereotactic guidance, a 9 gauge vacuum assisted device was used to perform core needle biopsy of calcifications in the lower outer left breast using a cranial approach. Specimen radiograph was performed showing calcifications. Specimens with calcifications are identified for pathology. Lesion  quadrant: Lower outer quadrant At the conclusion of the procedure, a coil shaped tissue marker clip was deployed into the biopsy cavity. Follow-up 2-view mammogram was performed and dictated separately. IMPRESSION: Stereotactic-guided biopsy of left breast calcifications. No apparent complications. Electronically Signed   By: Lovey Newcomer M.D.   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 GRADE I-II INVASIVE DUCTAL CARCINOMA WITH EXTRACELLULAR MUCIN of the Left breast, 9:30 o'clock. METASTATIC CARCINOMA IN ONE LYMPH NODE of the Left axilla. This was found to be concordant by Dr. Lovey Newcomer. Pathology results were discussed with the patient by telephone. The patient reported doing well after the biopsies with tenderness at the sites. Post biopsy instructions and care were reviewed and questions were answered. The patient was encouraged to call [facility] for any additional concerns. Surgical consultation has been arranged with Dr. Erroll Luna at [facility] on January 21, 2017. Recommendation for bilateral breast MRI to evaluate extent of disease. Pathology results reported by Terie Purser, RN on 01/15/2017. Electronically Signed   By: Lovey Newcomer M.D.   On: 01/15/2017 14:45   Result Date: 01/16/2017 CLINICAL DATA:  Patient with suspicious palpable left breast mass. EXAM: ULTRASOUND GUIDED LEFT BREAST CORE NEEDLE BIOPSY COMPARISON:  Previous exam(s). FINDINGS: I met with the patient and we discussed the procedure of ultrasound-guided biopsy, including benefits and alternatives. We discussed the high likelihood of a successful procedure. We discussed the risks of the procedure, including infection, bleeding, tissue injury, clip migration, and inadequate  sampling. Informed written consent was given. The usual time-out protocol was performed immediately prior to the procedure. Lesion quadrant: Upper inner quadrant Using sterile technique and 1% Lidocaine as local anesthetic, under direct ultrasound visualization, a 12 gauge spring-loaded device was used to perform biopsy of left breast mass 930 o'clock using a medial approach. At the conclusion of the procedure a ribbon shaped tissue marker clip was deployed into the biopsy cavity. Follow up 2 view mammogram was performed and dictated separately. IMPRESSION: Ultrasound guided biopsy of left breast mass 930 o'clock. No apparent complications. Electronically Signed: By: Lovey Newcomer M.D. On: 01/14/2017 12:56       IMPRESSION/PLAN: 1. Stage IIA cT3N2 ER/PR positive, grade 1-2 invasive ductal carcinoma of the left breast. Dr. Lisbeth Renshaw discusses the pathology findings and reviews the nature of  invasive and in situ disease. Dr. Jana Hakim has met with the patient and discussed neoadjuvant chemotherapy followed by likely left modified mastectomy followed by post mastectomy radiotherapy and adjuvant antiestrogens. She understands that even if she had breast conservation, we would anticipate the same course of raditoherapy to the breast or chest wall plus regional nodes. Dr. Lisbeth Renshaw is in agreement with this plan. We discussed the risks, benefits, short, and long term effects of radiotherapy, and the patient is interested in proceeding. Dr. Lisbeth Renshaw discusses the delivery and logistics of radiotherapy and would offer her a course of 6 1/2 weeks of radiotherapy to the left breast with deep inspiration breath hold technique and regional lymph nodes. We will see her back about 2 weeks after surgery to move forward with the simulation and planning process and anticipate starting radiotherapy about 4 weeks after surgery.  2. Possible genetic predisposition to malignancy. The patient has been referred for genetic testing. We will follow  up with this   The above documentation reflects my direct findings during this shared patient visit. Please see the separate note by Dr. Lisbeth Renshaw on this date for the remainder of the patient's plan of care.    Carola Rhine, PAC

## 2017-01-27 NOTE — Addendum Note (Signed)
Encounter addended by: Doreen Beam, RN on: 01/27/2017  2:49 PM<BR>    Actions taken: Patient Education assessment filed

## 2017-01-27 NOTE — Pre-Procedure Instructions (Signed)
Ensure pre surgery given and patient aware to finish by 1215 DOS. Anesthesia Consult with Dr Lissa Hoard.

## 2017-01-28 ENCOUNTER — Ambulatory Visit (HOSPITAL_BASED_OUTPATIENT_CLINIC_OR_DEPARTMENT_OTHER): Payer: 59 | Admitting: Certified Registered"

## 2017-01-28 ENCOUNTER — Ambulatory Visit (HOSPITAL_COMMUNITY): Payer: 59

## 2017-01-28 ENCOUNTER — Ambulatory Visit (HOSPITAL_BASED_OUTPATIENT_CLINIC_OR_DEPARTMENT_OTHER)
Admission: RE | Admit: 2017-01-28 | Discharge: 2017-01-28 | Disposition: A | Discharge status: Home / Self Care | Payer: 59 | Source: Ambulatory Visit | Attending: Surgery | Admitting: Surgery

## 2017-01-28 ENCOUNTER — Encounter (HOSPITAL_BASED_OUTPATIENT_CLINIC_OR_DEPARTMENT_OTHER): Payer: Self-pay | Admitting: Anesthesiology

## 2017-01-28 ENCOUNTER — Encounter (HOSPITAL_BASED_OUTPATIENT_CLINIC_OR_DEPARTMENT_OTHER)
Admission: RE | Disposition: A | Discharge status: Home / Self Care | Payer: Self-pay | Source: Ambulatory Visit | Attending: Surgery

## 2017-01-28 DIAGNOSIS — K219 Gastro-esophageal reflux disease without esophagitis: Secondary | ICD-10-CM | POA: Diagnosis not present

## 2017-01-28 DIAGNOSIS — E119 Type 2 diabetes mellitus without complications: Secondary | ICD-10-CM | POA: Insufficient documentation

## 2017-01-28 DIAGNOSIS — C773 Secondary and unspecified malignant neoplasm of axilla and upper limb lymph nodes: Secondary | ICD-10-CM | POA: Diagnosis not present

## 2017-01-28 DIAGNOSIS — Z419 Encounter for procedure for purposes other than remedying health state, unspecified: Secondary | ICD-10-CM

## 2017-01-28 DIAGNOSIS — Z95828 Presence of other vascular implants and grafts: Secondary | ICD-10-CM

## 2017-01-28 DIAGNOSIS — Z452 Encounter for adjustment and management of vascular access device: Secondary | ICD-10-CM | POA: Diagnosis not present

## 2017-01-28 DIAGNOSIS — C50912 Malignant neoplasm of unspecified site of left female breast: Secondary | ICD-10-CM | POA: Insufficient documentation

## 2017-01-28 DIAGNOSIS — C50812 Malignant neoplasm of overlapping sites of left female breast: Secondary | ICD-10-CM | POA: Diagnosis not present

## 2017-01-28 HISTORY — PX: PORTACATH PLACEMENT: SHX2246

## 2017-01-28 SURGERY — INSERTION, TUNNELED CENTRAL VENOUS DEVICE, WITH PORT
Anesthesia: General | Site: Chest | Laterality: Right

## 2017-01-28 MED ORDER — HYDROMORPHONE HCL 1 MG/ML IJ SOLN
0.2500 mg | INTRAMUSCULAR | Status: DC | PRN
  Administered 2017-01-28: 0.5 mg via INTRAVENOUS

## 2017-01-28 MED ORDER — MIDAZOLAM HCL 2 MG/2ML IJ SOLN
INTRAMUSCULAR | Status: AC
  Filled 2017-01-28: qty 2

## 2017-01-28 MED ORDER — BUPIVACAINE-EPINEPHRINE 0.25% -1:200000 IJ SOLN
INTRAMUSCULAR | Status: DC | PRN
  Administered 2017-01-28: 10 mL

## 2017-01-28 MED ORDER — PROPOFOL 10 MG/ML IV BOLUS
INTRAVENOUS | Status: DC | PRN
  Administered 2017-01-28: 150 mg via INTRAVENOUS

## 2017-01-28 MED ORDER — CEFAZOLIN SODIUM-DEXTROSE 2-4 GM/100ML-% IV SOLN
INTRAVENOUS | Status: AC
  Filled 2017-01-28: qty 200

## 2017-01-28 MED ORDER — HEPARIN (PORCINE) IN NACL 2-0.9 UNIT/ML-% IJ SOLN
INTRAMUSCULAR | Status: AC | PRN
  Administered 2017-01-28: 500 mL via INTRAVENOUS

## 2017-01-28 MED ORDER — LACTATED RINGERS IV SOLN: INTRAVENOUS | Status: DC | PRN

## 2017-01-28 MED ORDER — DEXAMETHASONE SODIUM PHOSPHATE 4 MG/ML IJ SOLN
INTRAMUSCULAR | Status: DC | PRN
  Administered 2017-01-28: 10 mg via INTRAVENOUS

## 2017-01-28 MED ORDER — FENTANYL CITRATE (PF) 100 MCG/2ML IJ SOLN
INTRAMUSCULAR | Status: AC
  Filled 2017-01-28: qty 2

## 2017-01-28 MED ORDER — ONDANSETRON HCL 4 MG/2ML IJ SOLN
INTRAMUSCULAR | Status: DC | PRN
  Administered 2017-01-28: 4 mg via INTRAVENOUS

## 2017-01-28 MED ORDER — CEFAZOLIN SODIUM 10 G IJ SOLR
3.0000 g | INTRAMUSCULAR | Status: AC
  Administered 2017-01-28: 3 g via INTRAVENOUS

## 2017-01-28 MED ORDER — OXYCODONE HCL 5 MG PO TABS: 5.0000 mg | ORAL_TABLET | Freq: Four times a day (QID) | ORAL | 0 refills | Status: DC | PRN

## 2017-01-28 MED ORDER — OXYCODONE HCL 5 MG/5ML PO SOLN: 5.0000 mg | Freq: Once | ORAL | Status: DC | PRN

## 2017-01-28 MED ORDER — MIDAZOLAM HCL 2 MG/2ML IJ SOLN
1.0000 mg | INTRAMUSCULAR | Status: DC | PRN
  Administered 2017-01-28: 2 mg via INTRAVENOUS

## 2017-01-28 MED ORDER — PROMETHAZINE HCL 25 MG/ML IJ SOLN: 6.2500 mg | INTRAMUSCULAR | Status: DC | PRN

## 2017-01-28 MED ORDER — FENTANYL CITRATE (PF) 100 MCG/2ML IJ SOLN
50.0000 ug | INTRAMUSCULAR | Status: DC | PRN
  Administered 2017-01-28: 100 ug via INTRAVENOUS

## 2017-01-28 MED ORDER — HEPARIN SOD (PORK) LOCK FLUSH 100 UNIT/ML IV SOLN
INTRAVENOUS | Status: DC | PRN
  Administered 2017-01-28: 500 [IU] via INTRAVENOUS

## 2017-01-28 MED ORDER — CHLORHEXIDINE GLUCONATE CLOTH 2 % EX PADS: 6.0000 | MEDICATED_PAD | Freq: Once | CUTANEOUS | Status: DC

## 2017-01-28 MED ORDER — HYDROMORPHONE HCL 1 MG/ML IJ SOLN
INTRAMUSCULAR | Status: AC
  Filled 2017-01-28: qty 0.5

## 2017-01-28 MED ORDER — OXYCODONE HCL 5 MG PO TABS: 5.0000 mg | ORAL_TABLET | Freq: Once | ORAL | Status: DC | PRN

## 2017-01-28 MED ORDER — LIDOCAINE 2% (20 MG/ML) 5 ML SYRINGE
INTRAMUSCULAR | Status: AC
  Filled 2017-01-28: qty 10

## 2017-01-28 MED ORDER — LACTATED RINGERS IV SOLN
INTRAVENOUS | Status: DC
  Administered 2017-01-28: 15:00:00 via INTRAVENOUS

## 2017-01-28 MED ORDER — LIDOCAINE HCL (CARDIAC) 20 MG/ML IV SOLN
INTRAVENOUS | Status: DC | PRN
  Administered 2017-01-28: 60 mg via INTRAVENOUS

## 2017-01-28 MED ORDER — SCOPOLAMINE 1 MG/3DAYS TD PT72: 1.0000 | MEDICATED_PATCH | Freq: Once | TRANSDERMAL | Status: DC | PRN

## 2017-01-28 MED ORDER — MEPERIDINE HCL 25 MG/ML IJ SOLN: 6.2500 mg | INTRAMUSCULAR | Status: DC | PRN

## 2017-01-28 MED FILL — oxyCODONE HCL 5 MG TABS: 5 | 3 days supply | Qty: 20 | Fill #0

## 2017-01-28 SURGICAL SUPPLY — 59 items
ADH SKN CLS APL DERMABOND .7 (GAUZE/BANDAGES/DRESSINGS) ×1
APL SKNCLS STERI-STRIP NONHPOA (GAUZE/BANDAGES/DRESSINGS) ×1
BAG DECANTER FOR FLEXI CONT (MISCELLANEOUS) ×2 IMPLANT
BENZOIN TINCTURE PRP APPL 2/3 (GAUZE/BANDAGES/DRESSINGS) ×1 IMPLANT
BLADE HEX COATED 2.75 (ELECTRODE) ×2 IMPLANT
BLADE SURG 11 STRL SS (BLADE) ×2 IMPLANT
BLADE SURG 15 STRL LF DISP TIS (BLADE) ×1 IMPLANT
BLADE SURG 15 STRL SS (BLADE) ×2
CANISTER SUCT 1200ML W/VALVE (MISCELLANEOUS) IMPLANT
CHLORAPREP W/TINT 26ML (MISCELLANEOUS) ×2 IMPLANT
COVER BACK TABLE 60X90IN (DRAPES) ×2 IMPLANT
COVER MAYO STAND STRL (DRAPES) ×2 IMPLANT
COVER PROBE 5X48 (MISCELLANEOUS) ×2
DECANTER SPIKE VIAL GLASS SM (MISCELLANEOUS) IMPLANT
DERMABOND ADVANCED (GAUZE/BANDAGES/DRESSINGS) ×1
DERMABOND ADVANCED .7 DNX12 (GAUZE/BANDAGES/DRESSINGS) ×1 IMPLANT
DRAPE C-ARM 42X72 X-RAY (DRAPES) ×2 IMPLANT
DRAPE LAPAROSCOPIC ABDOMINAL (DRAPES) ×2 IMPLANT
DRAPE UTILITY XL STRL (DRAPES) ×2 IMPLANT
DRSG TEGADERM 2-3/8X2-3/4 SM (GAUZE/BANDAGES/DRESSINGS) IMPLANT
ELECT REM PT RETURN 9FT ADLT (ELECTROSURGICAL) ×2
ELECTRODE REM PT RTRN 9FT ADLT (ELECTROSURGICAL) ×1 IMPLANT
GAUZE SPONGE 4X4 12PLY STRL LF (GAUZE/BANDAGES/DRESSINGS) IMPLANT
GEL ULTRASOUND 8.5O AQUASONIC (MISCELLANEOUS) ×2 IMPLANT
GLOVE BIO SURGEON STRL SZ 6.5 (GLOVE) ×1 IMPLANT
GLOVE BIOGEL PI IND STRL 7.0 (GLOVE) IMPLANT
GLOVE BIOGEL PI IND STRL 8 (GLOVE) ×1 IMPLANT
GLOVE BIOGEL PI INDICATOR 7.0 (GLOVE) ×1
GLOVE BIOGEL PI INDICATOR 8 (GLOVE) ×2
GLOVE ECLIPSE 8.0 STRL XLNG CF (GLOVE) ×2 IMPLANT
GOWN STRL REUS W/ TWL LRG LVL3 (GOWN DISPOSABLE) ×2 IMPLANT
GOWN STRL REUS W/TWL LRG LVL3 (GOWN DISPOSABLE) ×4
IV KIT MINILOC 20X1 SAFETY (NEEDLE) IMPLANT
KIT CVR 48X5XPRB PLUP LF (MISCELLANEOUS) ×1 IMPLANT
KIT PORT POWER 8FR ISP CVUE (Miscellaneous) ×1 IMPLANT
NDL SAFETY ECLIPSE 18X1.5 (NEEDLE) IMPLANT
NEEDLE HYPO 18GX1.5 SHARP (NEEDLE)
NEEDLE HYPO 22GX1.5 SAFETY (NEEDLE) IMPLANT
NEEDLE HYPO 25X1 1.5 SAFETY (NEEDLE) ×2 IMPLANT
NEEDLE SPNL 22GX3.5 QUINCKE BK (NEEDLE) IMPLANT
PACK BASIN DAY SURGERY FS (CUSTOM PROCEDURE TRAY) ×2 IMPLANT
PENCIL BUTTON HOLSTER BLD 10FT (ELECTRODE) ×2 IMPLANT
SET SHEATH INTRODUCER 10FR (MISCELLANEOUS) IMPLANT
SHEATH COOK PEEL AWAY SET 9F (SHEATH) IMPLANT
SLEEVE SCD COMPRESS KNEE MED (MISCELLANEOUS) ×1 IMPLANT
SPONGE LAP 4X18 X RAY DECT (DISPOSABLE) IMPLANT
STRIP CLOSURE SKIN 1/2X4 (GAUZE/BANDAGES/DRESSINGS) IMPLANT
SUT MON AB 4-0 PC3 18 (SUTURE) ×2 IMPLANT
SUT PROLENE 2 0 CT2 30 (SUTURE) IMPLANT
SUT PROLENE 2 0 SH DA (SUTURE) ×2 IMPLANT
SUT SILK 2 0 TIES 17X18 (SUTURE)
SUT SILK 2-0 18XBRD TIE BLK (SUTURE) IMPLANT
SUT VICRYL 3-0 CR8 SH (SUTURE) ×2 IMPLANT
SYR 5ML LUER SLIP (SYRINGE) ×2 IMPLANT
SYR CONTROL 10ML LL (SYRINGE) ×2 IMPLANT
TOWEL OR 17X24 6PK STRL BLUE (TOWEL DISPOSABLE) ×4 IMPLANT
TOWEL OR NON WOVEN STRL DISP B (DISPOSABLE) ×2 IMPLANT
TUBE CONNECTING 20X1/4 (TUBING) IMPLANT
YANKAUER SUCT BULB TIP NO VENT (SUCTIONS) IMPLANT

## 2017-01-28 NOTE — H&P (View-Only) (Signed)
Mackenzie [name] 01/21/2017 2:11 PM Location: [facility] Patient #: [id number] DOB: December 19, 1970 Married / Language: English / Race: Black or African American Female  History of Present Illness Mackenzie Moores A. Cornett MD; 01/21/2017 3:24 PM) Patient words: The patient is sent at the request of Dr. Lovey [name] for left breast mass. Patient noted changes to her left breast over the last 2 weeks which include thickening of her nipple and forms left breast. She underwent workup which included mammography, all showing core biopsy. 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 She had 4 suspicious nodes underwent core biopsy which showed metastatic breast cancer. Patient also now has a chronic cough. Patient denies back pain for extremity pain. She does complain of soreness in her breast biopsy site.                               CLINICAL DATA: Left breast palpable area felt by the patient approximately 1 week ago.  EXAM: 2D DIGITAL DIAGNOSTIC BILATERAL MAMMOGRAM WITH CAD AND ADJUNCT TOMO  ULTRASOUND LEFT BREAST  COMPARISON: Previous exam(s).  ACR Breast Density Category c: The breast tissue is heterogeneously dense, which may obscure small masses.  FINDINGS: Mammographically, there are no suspicious masses, areas of architectural distortion or microcalcifications in the right breast.  There is a regional asymmetry in the left central and upper inner breast, anterior and middle depth. Malignant type calcifications are seen involving all 4 quadrants of the breast and spanning the thickness of the breast with the most clustering in the upper inner quadrant, which corresponds to the area of palpable concern. There is anterior skin thickening and slight  trabecular thickening.  Mammographic images were processed with CAD.  On physical exam, there is firmness and diffuse palpable thickening in the anterior left breast. Skin dimpling is also seen in the upper inner left breast, anterior depth.  Targeted ultrasound is performed, showing left breast 930 o'clock 3 cm from the nipple, the area of palpable concern, irregular hypoechoic fragmented mass which measures 2.7 x 2.6 x 3.1 cm. In the left axilla there are 4 abnormal lymph nodes, the largest of which measures 2.7 cm in long axis, and demonstrates complete effacement of its hilum.  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  No mammographic evidence of malignancy in the right breast.  Malignant left axillary lymphadenopathy.  RECOMMENDATION: Ultrasound-guided core needle biopsy of left breast 930 o'clock mass and the largest abnormal left axillary lymph node.  Stereotactic core needle biopsy of group of malignant appearing calcifications in the lower outer left breast, to establish extent of disease.  I have discussed the findings and recommendations with the patient. Results were also provided in writing at the conclusion of the visit. If applicable, a reminder letter will be sent to the patient regarding the next appointment.  BI-RADS CATEGORY 5: Highly suggestive of malignancy.   Electronically Signed By: Mackenzie [name] M.D. On: 01/09/2017 09:19            ADDITIONAL INFORMATION: 1. PROGNOSTIC INDICATORS Results: IMMUNOHISTOCHEMICAL AND MORPHOMETRIC ANALYSIS PERFORMED MANUALLY Estrogen Receptor: 100%, POSITIVE,  STRONG STAINING INTENSITY Progesterone Receptor: 60%, POSITIVE, STRONG STAINING INTENSITY REFERENCE RANGE ESTROGEN RECEPTOR NEGATIVE  0% POSITIVE =>1% REFERENCE RANGE PROGESTERONE RECEPTOR NEGATIVE 0% POSITIVE =>1% All controls stained appropriately Mackenzie Laws MD Pathologist, Electronic Signature ( Signed 01/20/2017) 2. PROGNOSTIC INDICATORS Results: IMMUNOHISTOCHEMICAL AND MORPHOMETRIC ANALYSIS PERFORMED MANUALLY Estrogen Receptor: 100%, POSITIVE, STRONG STAINING INTENSITY Progesterone Receptor: 20%, POSITIVE, STRONG STAINING INTENSITY Proliferation Marker Ki67: 20% REFERENCE RANGE ESTROGEN RECEPTOR NEGATIVE 0% 1 of 3 FINAL for Mackenzie [name], Mackenzie [name] [phone]) ADDITIONAL INFORMATION:(continued) POSITIVE =>1% REFERENCE RANGE PROGESTERONE RECEPTOR NEGATIVE 0% POSITIVE =>1% All controls stained appropriately Mackenzie Laws MD Pathologist, Electronic Signature ( Signed 01/20/2017) 2. FLUORESCENCE IN-SITU HYBRIDIZATION Results: HER2 - NEGATIVE RATIO OF HER2/CEP17 SIGNALS 1.38 AVERAGE HER2 COPY NUMBER PER CELL 2.90 Reference Range: NEGATIVE HER2/CEP17 Ratio <2.0 and average HER2 copy number <4.0 EQUIVOCAL HER2/CEP17 Ratio <2.0 and average HER2 copy number >=4.0 and <6.0 POSITIVE HER2/CEP17 Ratio >=2.0 or <2.0 and average HER2 copy number >=6.0 Mackenzie Males MD Pathologist, Electronic Signature ( Signed 01/16/2017) FINAL DIAGNOSIS Diagnosis 1. Breast, left, needle core biopsy, lower outer - DUCTAL CARCINOMA IN SITU WITH CALCIFICATIONS, SEE COMMENT. 2. Breast, left, needle core biopsy, 9:30 o'clock - INVASIVE DUCTAL CARCINOMA WITH EXTRACELLULAR MUCIN, SEE COMMENT. 3. Lymph node, needle/core biopsy, axilla - METASTATIC CARCINOMA IN ONE LYMPH NODE (1/1). Microscopic Comment 1. The carcinoma in situ appears high grade. Prognostic markers will be ordered. 2. The carcinoma appears grade 1-2. Prognostic markers will be ordered. Dr. Lyndon [name] has reviewed the case. The case was called to [facility] on 01/15/2017. Mackenzie Males MD Pathologist, Electronic Signature (Case signed 01/15/2017) 2 of  3 FINAL for Mackenzie [name], Mackenzie [name] ([name]) Specimen Gross and Clinical Information Specimen Comment 1. TIF: 11:50 AM; extracted < 5 min; calcs 2. TIF: 12:15 PM; extracted < 30 sec; palp mass 3. TIF: 12:30 PM; extracted < 30 sec; enlarged lymph node Specimen(s) Obtained: 1. Breast, left, needle core biopsy, lower outer 2. Breast, left, needle core biopsy, 9:30 o'clock 3. Lymph node, needle/core biopsy, axilla Specimen Clinical Information 1. C/F DCIS 2. C/F IDC 3. C/F met node Gross 1. Received labeled as "Mackenzie [name],Mackenzie [name]" and "Lt breast calcs LOQ" ( TIF 1150 CIT <52mn) are multiple cores and irregular pieces of yellow to gray white soft to rubbery tissue, aggregating 2.1 x 1.6 x 0.2 cm. Pieces clinically having microcalcifications are submitted in block A and remaining specimen in block B. Total 2 blocks (SSW 8/8) 2. Received labeled as "Mackenzie [name],Mackenzie [name]" and "Left breast 930" ( TIF 1215 CIT <30secs) are 3 cores of yellow to gray white soft to firm tissue, each averaging 1.3 x 0.2 x 0.2 cm. One block submitted. (SSW 8/8) 3. Received labeled as "Mackenzie [name],Mackenzie [name]" and "Left axilla" ( TIF 1230 CIT <30secs) are 2 cores of yellow to gray white soft to firm tissue, 0.7 x 0.1 x 0.1 cm and 1.2 x 0.1 x 0.1 cm. One block submitted. (SSW 8/8) Stain(s) used in Diagnosis: The following stain(s) were used in diagnosing the case: Her2 FISH, PR-ACIS, KI-67-ACIS, ER-ACIS. The control(s) stained appropriately. Disclaimer Estrogen receptor (6F11), immunohistochemical stains are performed on formalin fixed, paraffin embedded tissue using a 3,3"-diaminobenzidine (DAB) chromogen and Leica Bond Autostainer System. The staining intensity of the nucleus is scored manually and is reported as the percentage of tumor cell nuclei demonstrating specific nuclear staining.Specimens are fixed in 10% Neutral Buffered Formalin for at least 6 hours and up to 72 hours. These tests have not be validated on decalcified tissue.  Results should be  interpreted with caution given the possibility of false negative results on decalcified specimens. HER2 IQFISH pharmDX ([name] [phone]) is a direct fluorescence in-situ hybridization assay designed to quantitatively determine HER2 gene amplification in formalin-fixed, paraffin-embedded tissue specimens. It is performed at [facility] and is reported using ASCO/CAP scoring criteria published in 2013. Ki-67 (MM1), immunohistochemical stains are performed on formalin fixed, paraffin embedded tissue using a 3,3"-diaminobenzidine (DAB) chromogen and Leica Bond Autostainer System. The staining intensity of the nucleus is scored manually and is reported as the percentage of tumor cell nuclei demonstrating specific nuclear staining.Specimens are fixed in 10% Neutral Buffered Formalin for at least 6 hours and up to 72 hours. These tests have not be validated on decalcified tissue. Results should be interpreted with caution given the possibility of false negative results on decalcified specimens. PR progesterone receptor (16), immunohistochemical stains are performed on formalin fixed, paraffin embedded tissue using a 3,3"-diaminobenzidine (DAB) chromogen and Leica Bond Autostainer System. The staining intensity of the nucleus is scored manually and is reported as the percentage of tumor cell nuclei demonstrating specific nuclear staining.Specimens are fixed in 10% Neutral Buffered Formalin for at least 6 hours and up to 72 hours. These tests have not be validated on decalcified tissue. Results should be interpreted with caution given the possibility of false negative results on decalcified specimens. Report signed out from the following location(s) Technical Component was performed at [facility]. [address],[place] [address].[id number]., Interpretation was performed at [address], [place], [place]  [address]. CLIA #: [id number], 3 of 3.  The patient is a 46 year old female.   Past Surgical History Malachy Moan, [place]; 01/21/2017 2:11 PM) Cesarean Section - Multiple  Diagnostic Studies History Malachy Moan, [place]; 01/21/2017 2:11 PM) Colonoscopy never Mammogram within last year Pap Smear 1-5 years ago  Allergies Malachy Moan, RMA; 01/21/2017 2:12 PM) No Known Allergies 01/21/2017  Medication History Malachy Moan, RMA; 01/21/2017 2:14 PM) Hydrocodone-Homatropine (5-1.5MG/5ML Syrup, Oral) Active. Benzonatate (100MG Capsule, Oral) Active. Singulair (10MG Tablet, Oral) Active. Vitamin D (Cholecalciferol) (Oral) Specific strength unknown - Active. Calcium (Oral) Specific strength unknown - Active. Medications Reconciled  Social History Malachy Moan, [place]; 01/21/2017 2:11 PM) Alcohol use Occasional alcohol use. Caffeine use Tea. No drug use Tobacco use Never smoker.  Family History Malachy Moan, [place]; 01/21/2017 2:11 PM) Alcohol Abuse Father. Cerebrovascular Accident Mother. Diabetes Mellitus Mother. Heart Disease Mother. Heart disease in female family member before age 11 Hypertension Mother. Kidney Disease Father. Seizure disorder Mother.  Pregnancy / Birth History Malachy Moan, [place]; 01/21/2017 2:11 PM) Age at menarche 66 years. Contraceptive History Depo-provera, Oral contraceptives. Gravida 6 Length (months) of breastfeeding 12-24 Maternal age 57-25 Para 4 Regular periods  Other Problems Malachy Moan, [place]; 01/21/2017 2:11 PM) Diabetes Mellitus Gastroesophageal Reflux Disease Lump In Breast     Review of Systems Malachy Moan RMA; 01/21/2017 2:11 PM) General Not Present- Appetite Loss, Chills, Fatigue, Fever, Night Sweats, Weight Gain and Weight Loss. Skin Present- New Lesions. Not Present- Change in Wart/Mole, Dryness, Hives, Jaundice, Non-Healing Wounds, Rash and Ulcer. HEENT Present- Wears  glasses/contact lenses. Not Present- Earache, Hearing Loss, Hoarseness, Nose Bleed, Oral Ulcers, Ringing in the Ears, Seasonal Allergies, Sinus Pain, Sore Throat, Visual Disturbances and Yellow Eyes. Respiratory Present- Chronic Cough. Not Present- Bloody sputum, Difficulty Breathing, Snoring and Wheezing. Breast Present- Breast Mass. Not Present- Breast Pain, Nipple Discharge and Skin Changes. Cardiovascular Not Present- Chest Pain, Difficulty Breathing Lying Down, Leg Cramps, Palpitations, Rapid  Heart Rate, Shortness of Breath and Swelling of Extremities. Gastrointestinal Not Present- Abdominal Pain, Bloating, Bloody Stool, Change in Bowel Habits, Chronic diarrhea, Constipation, Difficulty Swallowing, Excessive gas, Gets full quickly at meals, Hemorrhoids, Indigestion, Nausea, Rectal Pain and Vomiting. Female Genitourinary Not Present- Frequency, Nocturia, Painful Urination, Pelvic Pain and Urgency. Musculoskeletal Not Present- Back Pain, Joint Pain, Joint Stiffness, Muscle Pain, Muscle Weakness and Swelling of Extremities. Neurological Not Present- Decreased Memory, Fainting, Headaches, Numbness, Seizures, Tingling, Tremor, Trouble walking and Weakness. Psychiatric Not Present- Anxiety, Bipolar, Change in Sleep Pattern, Depression, Fearful and Frequent crying. Endocrine Not Present- Cold Intolerance, Excessive Hunger, Hair Changes, Heat Intolerance, Hot flashes and New Diabetes. Hematology Not Present- Blood Thinners, Easy Bruising, Excessive bleeding, Gland problems, HIV and Persistent Infections.  Vitals Malachy Moan RMA; 01/21/2017 2:15 PM) 01/21/2017 2:14 PM Weight: 280.6 lb Height: 66in Body Surface Area: 2.31 m Body Mass Index: 45.29 kg/m  Temp.: 97.31F  Pulse: 87 (Regular)  BP: 162/100 (Sitting, Left Arm, Standard)      Physical Exam (Eilee Schader A. Laelani Vasko MD; 01/21/2017 3:25 PM)  General Mental Status-Alert. General Appearance-Consistent with stated  age. Hydration-Well hydrated. Voice-Normal.  Head and Neck Head-normocephalic, atraumatic with no lesions or palpable masses. Trachea-midline. Thyroid Gland Characteristics - normal size and consistency.  Eye Eyeball - Bilateral-Extraocular movements intact. Sclera/Conjunctiva - Bilateral-No scleral icterus.  Chest and Lung Exam Chest and lung exam reveals -quiet, even and easy respiratory effort with no use of accessory muscles and on auscultation, normal breath sounds, no adventitious sounds and normal vocal resonance. Inspection Chest Wall - Normal. Back - normal.  Breast Note: Left breast is thickened nipple with dimpling of the skin. There is no erythema or signs of infection or signs of inflammatory breast cancer. The breast is firm especially centrally. Right breast is normal. There is mild to moderate left axillary adenopathy. None of the nodes appear fixed.  Cardiovascular Cardiovascular examination reveals -normal heart sounds, regular rate and rhythm with no murmurs and normal pedal pulses bilaterally.  Abdomen Inspection Inspection of the abdomen reveals - No Hernias. Skin - Scar - no surgical scars. Palpation/Percussion Palpation and Percussion of the abdomen reveal - Soft, Non Tender, No Rebound tenderness, No Rigidity (guarding) and No hepatosplenomegaly. Auscultation Auscultation of the abdomen reveals - Bowel sounds normal.  Musculoskeletal Normal Exam - Left-Upper Extremity Strength Normal and Lower Extremity Strength Normal. Normal Exam - Right-Upper Extremity Strength Normal and Lower Extremity Strength Normal.  Lymphatic Head & Neck  General Head & Neck Lymphatics: Bilateral - Description - Normal. Axillary -Note:Left axillary adenopathy noted. Right nasal is normal. There is no evidence of supraclavicular adenopathy.     Assessment & Plan (Lorence Nagengast A. Wakisha Alberts MD; 01/21/2017 3:26 PM)  BREAST CANCER, LEFT (C50.912) Impression:  Referral to medical and radiation oncology. This does not appear clinically to be inflammatory breast cancer therefore I do not recommend a skin punch biopsy at this time. If that were to change, we can consider that. I recommend MRI in the placement of a Port-A-Cath for chemotherapy. I discussed the role of chemotherapy as well as potential surgery down the road. She may need staging scans and will see medical oncology and radiation oncology Pt requires port placement for chemotherapy. Risk include bleeding, infection, pneumothorax, hemothorax, mediastinal injury, nerve injury , blood vessel injury, strke, blood clots, death, migration. embolization and need for additional procedures. Pt agrees to proceed.  Current Plans You are being scheduled for surgery- Our schedulers will call you.  You should hear from  our office's scheduling department within 5 working days about the location, date, and time of surgery. We try to make accommodations for patient's preferences in scheduling surgery, but sometimes the OR schedule or the surgeon's schedule prevents [name] from making those accommodations.  If you have not heard from our office [phone]) in 5 working days, call the office and ask for your surgeon's nurse.  If you have other questions about your diagnosis, plan, or surgery, call the office and ask for your surgeon's nurse.  Pt Education - [facility] Breast Cancer Information Given - [facility] "Breast Journey" Package Pt Education - Pamphlet Given - Breast Biopsy: discussed with patient and provided information. Referred to Oncology, for evaluation and follow up (Oncology). Routine. Referred to Radiation Oncology, for evaluation and follow up (Radiation Oncology). Routine. Use of a central venous catheter for intravenous therapy was discussed. Technique of catheter placement using ultrasound and fluoroscopy guidance was discussed. Risks such as bleeding, infection, pneumothorax, catheter occlusion,  reoperation, and other risks were discussed. I noted a good likelihood this will help address the problem. Questions were answered. The patient expressed understanding & wishes to proceed.

## 2017-01-28 NOTE — Interval H&P Note (Signed)
History and Physical Interval Note:  01/28/2017 1:56 PM  Mackenzie [name]  has presented today for surgery, with the diagnosis of POOR VENUS ACCESS  The various methods of treatment have been discussed with the patient and family. After consideration of risks, benefits and other options for treatment, the patient has consented to  Procedure(s): INSERTION PORT-A-CATH WITH ULTRASOUND (N/A) as a surgical intervention .  The patient's history has been reviewed, patient examined, no change in status, stable for surgery.  I have reviewed the patient's chart and labs.  Questions were answered to the patient's satisfaction.     Angelika Jerrett A.

## 2017-01-28 NOTE — Anesthesia Procedure Notes (Signed)
Procedure Name: LMA Insertion Date/Time: 01/28/2017 2:57 PM Performed by: Chania Kochanski D Pre-anesthesia Checklist: Patient identified, Emergency Drugs available, Suction available and Patient being monitored Patient Re-evaluated:Patient Re-evaluated prior to induction Oxygen Delivery Method: Circle system utilized Preoxygenation: Pre-oxygenation with 100% oxygen Induction Type: IV induction Ventilation: Mask ventilation without difficulty LMA: LMA inserted LMA Size: 4.0 Number of attempts: 1 Airway Equipment and Method: Bite block Placement Confirmation: positive ETCO2 Tube secured with: Tape Dental Injury: Teeth and Oropharynx as per pre-operative assessment

## 2017-01-28 NOTE — Transfer of Care (Signed)
Immediate Anesthesia Transfer of Care Note  Patient: Mackenzie [name]  Procedure(s) Performed: Procedure(s): INSERTION PORT-A-CATH WITH ULTRASOUND (Right)  Patient Location: PACU  Anesthesia Type:General  Level of Consciousness: awake and patient cooperative  Airway & Oxygen Therapy: Patient Spontanous Breathing and Patient connected to face mask oxygen  Post-op Assessment: Report given to RN and Post -op Vital signs reviewed and stable  Post vital signs: Reviewed and stable  Last Vitals:  Vitals:   01/28/17 1351  BP: (!) 152/81  Pulse: 74  Resp: 18  Temp: 36.7 C  SpO2: 100%    Last Pain:  Vitals:   01/28/17 1351  TempSrc: Oral         Complications: No apparent anesthesia complications

## 2017-01-28 NOTE — Anesthesia Preprocedure Evaluation (Signed)
Anesthesia Evaluation  Patient identified by MRN, date of birth, ID band Patient awake    Reviewed: Allergy & Precautions, H&P , NPO status , Patient's Chart, lab work & pertinent test results  Airway Mallampati: III  TM Distance: >3 FB Neck ROM: full    Dental no notable dental hx.    Pulmonary neg pulmonary ROS,    Pulmonary exam normal breath sounds clear to auscultation       Cardiovascular negative cardio ROS   Rhythm:Regular Rate:Normal     Neuro/Psych negative neurological ROS  negative psych ROS   GI/Hepatic negative GI ROS, Neg liver ROS,   Endo/Other  diabetes, Well Controlled, GestationalMorbid obesity  Renal/GU negative Renal ROS     Musculoskeletal   Abdominal (+) + obese,   Peds  Hematology  (+) anemia ,   Anesthesia Other Findings   Reproductive/Obstetrics negative OB ROS (+) Pregnancy                             Anesthesia Physical  Anesthesia Plan  ASA: III  Anesthesia Plan: General   Post-op Pain Management:    Induction:   PONV Risk Score and Plan: 3 and Ondansetron, Dexamethasone and Midazolam  Airway Management Planned: LMA  Additional Equipment:   Intra-op Plan:   Post-operative Plan: Extubation in OR  Informed Consent: I have reviewed the patients History and Physical, chart, labs and discussed the procedure including the risks, benefits and alternatives for the proposed anesthesia with the patient or authorized representative who has indicated his/her understanding and acceptance.   Dental advisory given  Plan Discussed with: CRNA  Anesthesia Plan Comments:         Anesthesia Quick Evaluation

## 2017-01-28 NOTE — Discharge Instructions (Signed)
° ° °PORT-A-CATH: POST OP INSTRUCTIONS ° °Always review your discharge instruction sheet given to you by the facility where your surgery was performed.  ° °1. A prescription for pain medication may be given to you upon discharge. Take your pain medication as prescribed, if needed. If narcotic pain medicine is not needed, then you make take acetaminophen (Tylenol) or ibuprofen (Advil) as needed.  °2. Take your usually prescribed medications unless otherwise directed. °3. If you need a refill on your pain medication, please contact our office. All narcotic pain medicine now requires a paper prescription.  Phoned in and fax refills are no longer allowed by law.  Prescriptions will not be filled after 5 pm or on weekends.  °4. You should follow a light diet for the remainder of the day after your procedure. °5. Most patients will experience some mild swelling and/or bruising in the area of the incision. It may take several days to resolve. °6. It is common to experience some constipation if taking pain medication after surgery. Increasing fluid intake and taking a stool softener (such as Colace) will usually help or prevent this problem from occurring. A mild laxative (Milk of Magnesia or Miralax) should be taken according to package directions if there are no bowel movements after 48 hours.  °7. Unless discharge instructions indicate otherwise, you may remove your bandages 48 hours after surgery, and you may shower at that time. You may have steri-strips (small white skin tapes) in place directly over the incision.  These strips should be left on the skin for 7-10 days.  If your surgeon used Dermabond (skin glue) on the incision, you may shower in 24 hours.  The glue will flake off over the next 2-3 weeks.  °8. If your port is left accessed at the end of surgery (needle left in port), the dressing cannot get wet and should only by changed by a healthcare professional. When the port is no longer accessed (when the  needle has been removed), follow step 7.   °9. ACTIVITIES:  Limit activity involving your arms for the next 72 hours. Do no strenuous exercise or activity for 1 week. You may drive when you are no longer taking prescription pain medication, you can comfortably wear a seatbelt, and you can maneuver your car. °10.You may need to see your doctor in the office for a follow-up appointment.  Please °      check with your doctor.  °11.When you receive a new Port-a-Cath, you will get a product guide and  °      ID card.  Please keep them in case you need them. ° °WHEN TO CALL YOUR DOCTOR ([phone]): °1. Fever over 101.0 °2. Chills °3. Continued bleeding from incision °4. Increased redness and tenderness at the site °5. Shortness of breath, difficulty breathing ° ° °The clinic staff is available to answer your questions during regular business hours. Please don’t hesitate to call and ask to speak to one of the nurses or medical assistants for clinical concerns. If you have a medical emergency, go to the nearest emergency room or call 911.  A surgeon from [facility] is always on call at the hospital.  ° ° ° °For further information, please visit [email or web address] ° ° ° ° °Post Anesthesia Home Care Instructions ° °Activity: °Get plenty of rest for the remainder of the day. A responsible individual must stay with you for 24 hours following the procedure.  °For the next 24 hours, DO NOT: °-Drive   a car °-Operate machinery °-Drink alcoholic beverages °-Take any medication unless instructed by your physician °-Make any legal decisions or sign important papers. ° °Meals: °Start with liquid foods such as gelatin or soup. Progress to regular foods as tolerated. Avoid greasy, spicy, heavy foods. If nausea and/or vomiting occur, drink only clear liquids until the nausea and/or vomiting subsides. Call your physician if vomiting continues. ° °Special Instructions/Symptoms: °Your throat may feel dry or sore  from the anesthesia or the breathing tube placed in your throat during surgery. If this causes discomfort, gargle with warm salt water. The discomfort should disappear within 24 hours. ° °If you had a scopolamine patch placed behind your ear for the management of post- operative nausea and/or vomiting: ° °1. The medication in the patch is effective for 72 hours, after which it should be removed.  Wrap patch in a tissue and discard in the trash. Wash hands thoroughly with soap and water. °2. You may remove the patch earlier than 72 hours if you experience unpleasant side effects which may include dry mouth, dizziness or visual disturbances. °3. Avoid touching the patch. Wash your hands with soap and water after contact with the patch. °  ° ° °

## 2017-01-28 NOTE — Op Note (Signed)
Preoperative diagnosis: PAC needed  Postoperative diagnosis: Same  Procedure: Portacath Placement  Surgeon: Turner Daniels, MD, FACS  Anesthesia: General and 0.25 % marcaine with epinephrine  Clinical History and Indications: The patient is getting ready to begin chemotherapy for her cancer. She  needs a Port-A-Cath for venous access. Risk of bleeding, infection,  Collapse lung,  Death,  DVT,  Organ injury,  Mediastinal injury,  Injury to heart,  Injury to blood vessels,  Nerves,  Migration of catheter,  Embolization of catheter and the need for more surgery.  Description of Procedure: I have seen the patient in the holding area and confirmed the plans for the procedure as noted above. I reviewed the risks and complications again and the patient has no further questions. She wishes to proceed.   The patient was then taken to the operating room. After satisfactory general  anesthesia had been obtained the upper chest and lower neck were prepped and draped as a sterile field. The timeout was done.  The right internal jugular vein  was entered under U/S guidance  and the guidewire threaded into the superior vena cava right atrial area under fluoroscopic guidance. An incision was then made on the anterior chest wall and a subcutaneous pocket fashioned for the port reservoir.  The port tubing was then brought through a subcutaneous tunnel from the port site to the guidewire site.  The port and catheter were attached, locked  and flushed. The catheter was measured and cut to appropriate length.The dilator and peel-away sheath were then advanced over the guidewire while monitoring this with fluoroscopy. The guidewire and dilator were removed and the tubing threaded to approximately 22 cm. The peel-away sheath was then removed. The catheter aspirated and flushed easily. Using fluoroscopy the tip was in the superior vena cava right atrial junction area. It aspirated and flushed easily. That aspirated and  flushed easily.  The reservoir was secured to the fascia with 1 sutures of 2-0 Prolene. A final check with fluoroscopy was done to make sure we had no kinks and good positioning of the tip of the catheter. Everything appeared to be okay. The catheter was aspirated, flushed with dilute heparin and then concentrated aqueous heparin.  The incision was then closed with interrupted 3-0 Vicryl, and 4-0 Monocryl subcuticular with Dermabond on the skin.  There were no operative complications. Estimated blood loss was minimal. All counts were correct. The patient tolerated the procedure well.  Turner Daniels, MD, FACS

## 2017-01-29 ENCOUNTER — Encounter (HOSPITAL_BASED_OUTPATIENT_CLINIC_OR_DEPARTMENT_OTHER): Payer: Self-pay | Admitting: Surgery

## 2017-01-29 NOTE — Anesthesia Postprocedure Evaluation (Signed)
Anesthesia Post Note  Patient: Mackenzie [name]  Procedure(s) Performed: Procedure(s) (LRB): INSERTION PORT-A-CATH WITH ULTRASOUND (Right)     Patient location during evaluation: PACU Anesthesia Type: General Level of consciousness: sedated and patient cooperative Pain management: pain level controlled Vital Signs Assessment: post-procedure vital signs reviewed and stable Respiratory status: spontaneous breathing Cardiovascular status: stable Anesthetic complications: no    Last Vitals:  Vitals:   01/28/17 1615 01/28/17 1645  BP: (!) 158/96 (!) 152/83  Pulse: 71 64  Resp: 13 18  Temp:  36.6 C  SpO2: 99% 100%    Last Pain:  Vitals:   01/28/17 1645  TempSrc: Oral  PainSc: 2                  Nolon Nations

## 2017-01-30 ENCOUNTER — Ambulatory Visit (HOSPITAL_COMMUNITY): Payer: 59

## 2017-01-30 ENCOUNTER — Ambulatory Visit (HOSPITAL_COMMUNITY): Payer: 59 | Attending: Cardiovascular Disease

## 2017-01-30 ENCOUNTER — Other Ambulatory Visit: Payer: Self-pay

## 2017-01-30 DIAGNOSIS — E119 Type 2 diabetes mellitus without complications: Secondary | ICD-10-CM | POA: Diagnosis not present

## 2017-01-30 DIAGNOSIS — I071 Rheumatic tricuspid insufficiency: Secondary | ICD-10-CM | POA: Insufficient documentation

## 2017-01-30 DIAGNOSIS — Z170 Estrogen receptor positive status [ER+]: Secondary | ICD-10-CM

## 2017-01-30 DIAGNOSIS — Z6841 Body Mass Index (BMI) 40.0 and over, adult: Secondary | ICD-10-CM | POA: Diagnosis not present

## 2017-01-30 DIAGNOSIS — E669 Obesity, unspecified: Secondary | ICD-10-CM | POA: Insufficient documentation

## 2017-01-30 DIAGNOSIS — C773 Secondary and unspecified malignant neoplasm of axilla and upper limb lymph nodes: Secondary | ICD-10-CM | POA: Diagnosis not present

## 2017-01-30 DIAGNOSIS — C50912 Malignant neoplasm of unspecified site of left female breast: Secondary | ICD-10-CM

## 2017-01-30 DIAGNOSIS — I371 Nonrheumatic pulmonary valve insufficiency: Secondary | ICD-10-CM | POA: Insufficient documentation

## 2017-01-30 DIAGNOSIS — C50812 Malignant neoplasm of overlapping sites of left female breast: Secondary | ICD-10-CM

## 2017-02-03 ENCOUNTER — Encounter: Payer: Self-pay | Admitting: *Deleted

## 2017-02-03 NOTE — Progress Notes (Signed)
[place] Psychosocial Distress Screening Clinical Social Work  Clinical Social Work was referred by distress screening protocol.  The patient scored a 5 on the Psychosocial Distress Thermometer which indicates moderate distress. Clinical Social Worker contacted patient at home to offer support and assess for distress and other psychosocial needs.  CSW left patient a detail voicemail with information on the support team and support services at [facility].  CSW encouraged patient to call back with question or concerns.     ONCBCN DISTRESS SCREENING 01/27/2017  Distress experienced in past week (1-10) 5  Practical problem type Housing;Work/school  Family Problem type Partner;Children  Emotional problem type Nervousness/Anxiety;Adjusting to illness  Information Concerns Type Lack of info about complementary therapy choices;Lack of info about maintaining fitness  Physical Problem type Pain;Swollen arms/legs  Physician notified of physical symptoms Yes  Referral to clinical social work Yes    Johnnye Lana, MSW, LCSW, OSW-C Clinical Social Worker [place] [phone]

## 2017-02-05 ENCOUNTER — Telehealth: Payer: Self-pay

## 2017-02-05 ENCOUNTER — Ambulatory Visit (HOSPITAL_COMMUNITY)
Admission: RE | Admit: 2017-02-05 | Discharge: 2017-02-05 | Disposition: A | Discharge status: Home / Self Care | Payer: 59 | Source: Ambulatory Visit | Attending: Oncology | Admitting: Oncology

## 2017-02-05 ENCOUNTER — Telehealth: Payer: Self-pay | Admitting: Student-PharmD

## 2017-02-05 ENCOUNTER — Encounter (HOSPITAL_COMMUNITY)
Admission: RE | Admit: 2017-02-05 | Discharge: 2017-02-05 | Disposition: A | Discharge status: Home / Self Care | Payer: 59 | Source: Ambulatory Visit | Attending: Oncology | Admitting: Oncology

## 2017-02-05 DIAGNOSIS — C773 Secondary and unspecified malignant neoplasm of axilla and upper limb lymph nodes: Secondary | ICD-10-CM | POA: Diagnosis not present

## 2017-02-05 DIAGNOSIS — Z170 Estrogen receptor positive status [ER+]: Secondary | ICD-10-CM | POA: Diagnosis not present

## 2017-02-05 DIAGNOSIS — R948 Abnormal results of function studies of other organs and systems: Secondary | ICD-10-CM | POA: Insufficient documentation

## 2017-02-05 DIAGNOSIS — R234 Changes in skin texture: Secondary | ICD-10-CM | POA: Diagnosis not present

## 2017-02-05 DIAGNOSIS — C50912 Malignant neoplasm of unspecified site of left female breast: Secondary | ICD-10-CM

## 2017-02-05 DIAGNOSIS — C50812 Malignant neoplasm of overlapping sites of left female breast: Secondary | ICD-10-CM

## 2017-02-05 DIAGNOSIS — R51 Headache: Secondary | ICD-10-CM | POA: Diagnosis not present

## 2017-02-05 DIAGNOSIS — C50919 Malignant neoplasm of unspecified site of unspecified female breast: Secondary | ICD-10-CM | POA: Diagnosis not present

## 2017-02-05 MED ORDER — GADOBENATE DIMEGLUMINE 529 MG/ML IV SOLN
20.0000 mL | Freq: Once | INTRAVENOUS | Status: AC | PRN
  Administered 2017-02-05: 20 mL via INTRAVENOUS

## 2017-02-05 MED ORDER — IOPAMIDOL (ISOVUE-300) INJECTION 61%
INTRAVENOUS | Status: AC
  Administered 2017-02-05: 75 mL via INTRAVENOUS
  Filled 2017-02-05: qty 75

## 2017-02-05 MED ORDER — HEPARIN SOD (PORK) LOCK FLUSH 100 UNIT/ML IV SOLN
500.0000 [IU] | INTRAVENOUS | Status: AC | PRN
  Administered 2017-02-05: 500 [IU]

## 2017-02-05 MED ORDER — TECHNETIUM TC 99M MEDRONATE IV KIT
25.0000 | PACK | Freq: Once | INTRAVENOUS | Status: AC | PRN
  Administered 2017-02-05: 25 via INTRAVENOUS

## 2017-02-05 NOTE — Telephone Encounter (Signed)
When called back she had already talked with navigator "I am good"

## 2017-02-05 NOTE — Telephone Encounter (Addendum)
Pharmacist Encounter  I spoke with the patient regarding the use of supplements: Turmeric, Vitamin C, Evening Primrose Oil, and Flaxseed Oil.   I informed the patient that Turmeric and Vitamin C can interfere with her chemotherapy, as Turmeric as been show to inhibit Doxorubicin-induced apoptosis and Cyclophosphamide-induced tumor regression, and Vitamin C can interfere with the effectiveness of Doxorubicin therapy. Patient was utilizing Turmeric as an anti-inflammatory and Vitamin C as a way to boost her immune system.   I recommended that the patient hold Turmeric and Vitamin C until 3 months after completion of therapy.   I informed the patient that Evening Primrose Oil and Flaxseed Oil can have some estrogenic properties and should not be used in patients with hormone-receptor positive breast cancer.   I recommended that the patient stops both the Evening Primrose Oil and Flaxseed Oil.   Mrs. Tanksley voiced understanding and agreed with the above plan.  She will be stopping Turmeric, Vitamin C, Evening Primrose Oil, and Flaxseed Oil.  The patient requested that I remove from these medications from her medication list. I have discontinued these medications from her medication list.   All questions answered.  Patient knows to call the office with questions or concerns.   Thank you,  Biagio Borg Student Pharmacist    Oral Chemotherapy Pharmacist Encounter  I have read and agree with the above note/plan.  Johny Drilling, PharmD, BCPS, BCOP 02/05/2017  12:44 PM Oral Oncology Clinic [phone]

## 2017-02-06 ENCOUNTER — Other Ambulatory Visit: Payer: 59

## 2017-02-06 ENCOUNTER — Other Ambulatory Visit: Payer: Self-pay | Admitting: Oncology

## 2017-02-06 ENCOUNTER — Ambulatory Visit (HOSPITAL_BASED_OUTPATIENT_CLINIC_OR_DEPARTMENT_OTHER): Payer: 59 | Admitting: Oncology

## 2017-02-06 VITALS — BP 148/88 | HR 83 | Temp 97.6°F | Resp 18 | Ht 66.0 in | Wt 278.5 lb

## 2017-02-06 DIAGNOSIS — C50812 Malignant neoplasm of overlapping sites of left female breast: Secondary | ICD-10-CM

## 2017-02-06 DIAGNOSIS — Z170 Estrogen receptor positive status [ER+]: Secondary | ICD-10-CM

## 2017-02-06 DIAGNOSIS — M899 Disorder of bone, unspecified: Secondary | ICD-10-CM | POA: Diagnosis not present

## 2017-02-06 DIAGNOSIS — C50912 Malignant neoplasm of unspecified site of left female breast: Secondary | ICD-10-CM

## 2017-02-06 DIAGNOSIS — D0512 Intraductal carcinoma in situ of left breast: Secondary | ICD-10-CM | POA: Diagnosis not present

## 2017-02-06 DIAGNOSIS — C773 Secondary and unspecified malignant neoplasm of axilla and upper limb lymph nodes: Principal | ICD-10-CM

## 2017-02-06 MED ORDER — DEXAMETHASONE 4 MG PO TABS: ORAL_TABLET | ORAL | 1 refills | Status: DC

## 2017-02-06 MED ORDER — LIDOCAINE-PRILOCAINE 2.5-2.5 % EX CREA: TOPICAL_CREAM | CUTANEOUS | 3 refills | Status: DC

## 2017-02-06 MED ORDER — LORAZEPAM 0.5 MG PO TABS: 0.5000 mg | ORAL_TABLET | Freq: Every evening | ORAL | 0 refills | Status: DC | PRN

## 2017-02-06 MED ORDER — PROCHLORPERAZINE MALEATE 10 MG PO TABS: 10.0000 mg | ORAL_TABLET | Freq: Four times a day (QID) | ORAL | 1 refills | Status: DC | PRN

## 2017-02-06 MED FILL — LORazepam 0.5 MG TABS: 0.5 | 30 days supply | Qty: 30 | Fill #0

## 2017-02-06 MED FILL — LIDOCAINE-PRILOCAINE CREAM: 2.5-2.5 | 30 days supply | Qty: 30 | Fill #0

## 2017-02-06 MED FILL — PROCHLORPERAZINE 10 MG TAB: 10 | 8 days supply | Qty: 30 | Fill #0

## 2017-02-06 MED FILL — DEXAMETHASONE 4 MG TABLET: 4 | 21 days supply | Qty: 30 | Fill #0

## 2017-02-06 NOTE — Progress Notes (Signed)
[place]  [phone] Fax:(336) [phone]     ID: Mackenzie [name] DOB: August 05, 1970  MR#: [id number]  JOA#:[id number]  Patient Care Team: Willey Blade, MD as PCP - General (Internal Medicine) Joselito Fieldhouse, [name], MD as Consulting Physician (Oncology) Erroll Luna, MD as Consulting Physician (General Surgery) Kyung Rudd, MD as Consulting Physician (Radiation Oncology) Chauncey Cruel, MD OTHER MD:  CHIEF COMPLAINT: Estrogen receptor positive breast cancer  CURRENT TREATMENT: Neoadjuvant chemotherapy pending   HISTORY OF CURRENT ILLNESS: From the original intake note:  The patient herself noted some changes in her left breast late July 2018, she says, and eventually brought this to medical attention so that on 01/09/2017 she underwent bilateral diagnostic mammography with tomography and left breast ultrasonography at the breast Center. This found the breast density to be category C. In the upper inner quadrant of the left breast there was an area of asymmetry and there were malignant type calcifications involving all 4 quadrants. On exam there is firmness and palpable thickening in the anterior left breast with skin dimpling. Ultrasonography found at the 9:30 o'clock radiant 3 cm from the nipple a 2.7 cm mass and in the left axilla for abnormal lymph nodes largest of which measured 2.7 cm.  Biopsy of the left breast 9:30 o'clock mass 80 01/26/2017 showed ([id number]) invasive ductal carcinoma, with extracellular mucin, grade 1 or 2. In the lower outer left breast is separated biopsy the same day showed ductal carcinoma in situ. One of the 4 lymph nodes involved was positive for metastatic carcinoma. Prognostic panel on the invasive disease showed it to be estrogen receptor 100% positive, progesterone receptor 20% positive, both with strong staining intensity, with an MIB-1 of 20%, and no HER-2 amplification with a signals ratio 1.38 and the number per  cell 2.90.  On 01/22/2017 the patient underwent bilateral breast MRI. This showed no involvement of the right breast, but in the left breast there was a masslike and non-masslike enhancement involving all quadrants, with skin swelling but no abnormal enhancement of the skin or nipple areolar complex or pectoralis muscle. The mass could not be clearly measured but spanned approximately 12 cm. There was bulky left axillary lymphadenopathy, with the largest lymph node measuring up to 3.4 cm.  The patient's subsequent history is as detailed below.  INTERVAL HISTORY: Mackenzie [name] returns today for follow-up and treatment of her estrogen receptor positive breast cancer accompanied by her husband and a close friend.  Since her last visit here she had an echocardiogram 01/30/2017 which showed an ejection fraction in the 60-65% range. She was then staged on 02/05/2017 with a brain MRI, which was unremarkable, a chest CT scan that did not show any evidence of involvement of the liver or lungs, and a bone scan which showed significant degenerative disease. The CT of the chest did show a 4.9 cm left breast mass and axillary and subpectoral adenopathy. The bone scan showed some nonspecific areas including a lesion at L2 for which MRI was suggested--and it has been ordered for next week.  MRI of the breast 01/22/2017 showed the left breast mass to measure at least 12 cm. There was no involvement of the pectoralis muscle. There was bulky left axillary adenopathy, the largest lymph node measuring 3.4 cm.  She is here today to discuss those results and the management of side effects with her upcoming chemotherapy  REVIEW OF SYSTEMS: A detailed review of systems today was otherwise stable  PAST MEDICAL HISTORY: Past Medical History:  Diagnosis Date  . Anemia   . Breast cancer ([place]) 01/14/2017   Left breast  . Diabetes mellitus    GDM    PAST SURGICAL HISTORY: Past Surgical History:  Procedure Laterality Date    . CESAREAN SECTION     x2  . PORTACATH PLACEMENT Right 01/28/2017   Procedure: INSERTION PORT-A-CATH WITH ULTRASOUND;  Surgeon: Erroll Luna, MD;  Location: [facility];  Service: General;  Laterality: Right;  . TUBAL LIGATION      FAMILY HISTORY Family History  Problem Relation Age of Onset  . Breast cancer Paternal Grandmother   The patient's father still alive at age 33. The patient's mother died with a brain tumor which the patient says was "benign". She was 56. The patient has one brother, no sisters. The only breast cancer in the family is a paternal grandmother who died from breast cancer at an unknown age. There is no history of ovarian or prostate cancer in the family.  GYNECOLOGIC HISTORY:  Patient's last menstrual period was 01/09/2017. Menarche age 41 and first live birth age 16 she is [place] P4. The person is still having regular periods as of August 2018  SOCIAL HISTORY: (As of August 2018) Sanda works at [employer] in the fifth floor [place]. Her husband [name] is a Building control surveyor. Daughter and married and lives in [place] and works as a Education administrator. Daughter [name] lives in [place] and works in the police department. Daughters [place] and Colton are 6 and 6, living at home. The patient has no grandchildren. She attends a Tour manager    ADVANCED DIRECTIVES: Not in place   HEALTH MAINTENANCE: Social History  Substance Use Topics  . Smoking status: Never Smoker  . Smokeless tobacco: Never Used  . Alcohol use Yes     Comment: social     Colonoscopy: Never  PAP: December 2017  Bone density: Never   No Known Allergies  Current Outpatient Prescriptions  Medication Sig Dispense Refill  . benzonatate (TESSALON) 100 MG capsule Take 100 mg by mouth as needed. For cough    . cetirizine (ZYRTEC) 10 MG tablet Take 10 mg by mouth daily.      . cholecalciferol (VITAMIN D) 1000 units tablet Take 1,000 Units by mouth daily.    Marland Kitchen  dexamethasone (DECADRON) 4 MG tablet Take 2 tablets by mouth once a day on the day after chemotherapy and then take 2 tablets two times a day for 2 days. Take with food. 30 tablet 1  . HYDROcodone-homatropine (HYCODAN) 5-1.5 MG/5ML syrup Take 5 mLs by mouth 3 (three) times daily as needed.    . lidocaine-prilocaine (EMLA) cream Apply to affected area once 30 g 3  . LORazepam (ATIVAN) 0.5 MG tablet Take 1 tablet (0.5 mg total) by mouth at bedtime as needed (Nausea or vomiting). 30 tablet 0  . montelukast (SINGULAIR) 10 MG tablet Take 10 mg by mouth at bedtime. prn    . Omega-3 Fatty Acids (FISH OIL) 1000 MG CAPS Take 1,000 mg by mouth 2 (two) times daily.     Marland Kitchen oxyCODONE (OXY IR/ROXICODONE) 5 MG immediate release tablet Take 1-2 tablets (5-10 mg total) by mouth every 6 (six) hours as needed for severe pain. 20 tablet 0  . prochlorperazine (COMPAZINE) 10 MG tablet Take 1 tablet (10 mg total) by mouth every 6 (six) hours as needed (Nausea or vomiting). 30 tablet 1   No current facility-administered medications for this visit.     OBJECTIVE: Morbidly obese  African-American woman in no acute distress  Vitals:   02/06/17 1124  BP: (!) 148/88  Pulse: 83  Resp: 18  Temp: 97.6 F (36.4 C)  SpO2: 100%     Body mass index is 44.95 kg/m.   Wt Readings from Last 3 Encounters:  02/06/17 278 lb 8 oz (126.3 kg)  01/28/17 279 lb (126.6 kg)  01/27/17 279 lb 6.4 oz (126.7 kg)      ECOG FS:1 - Symptomatic but completely ambulatory  Sclerae unicteric, pupils round and equal Oropharynx clear and moist No cervical or supraclavicular adenopathy Lungs no rales or rhonchi Heart regular rate and rhythm Abd soft, nontender, positive bowel sounds MSK no focal spinal tenderness, no upper extremity lymphedema Neuro: nonfocal, well oriented, appropriate affect Breasts: The right breast is benign. The left breast shows induration or chiefly the superior aspect, with some skin duskiness and retraction, but no  ulceration or skin nodules.  LAB RESULTS:  CMP     Component Value Date/Time   NA 135 01/27/2017 1229   K 4.1 01/27/2017 1229   CL 104 01/27/2017 1229   CO2 25 01/27/2017 1229   GLUCOSE 88 01/27/2017 1229   BUN 12 01/27/2017 1229   CREATININE 0.76 01/27/2017 1229   CALCIUM 9.3 01/27/2017 1229   PROT 7.5 01/27/2017 1229   ALBUMIN 3.9 01/27/2017 1229   AST 22 01/27/2017 1229   ALT 21 01/27/2017 1229   ALKPHOS 86 01/27/2017 1229   BILITOT 0.5 01/27/2017 1229   GFRNONAA >60 01/27/2017 1229   GFRAA >60 01/27/2017 1229    No results found for: TOTALPROTELP, ALBUMINELP, A1GS, A2GS, BETS, BETA2SER, GAMS, MSPIKE, SPEI  No results found for: Nils Pyle, [facility]  Lab Results  Component Value Date   WBC 9.1 01/27/2017   NEUTROABS 5.8 01/27/2017   HGB 11.9 (L) 01/27/2017   HCT 36.9 01/27/2017   MCV 78.3 01/27/2017   PLT 299 01/27/2017      Chemistry      Component Value Date/Time   NA 135 01/27/2017 1229   K 4.1 01/27/2017 1229   CL 104 01/27/2017 1229   CO2 25 01/27/2017 1229   BUN 12 01/27/2017 1229   CREATININE 0.76 01/27/2017 1229      Component Value Date/Time   CALCIUM 9.3 01/27/2017 1229   ALKPHOS 86 01/27/2017 1229   AST 22 01/27/2017 1229   ALT 21 01/27/2017 1229   BILITOT 0.5 01/27/2017 1229       No results found for: LABCA2  No components found for: TJQZES923  No results for input(s): INR in the last 168 hours.  No results found for: LABCA2  No results found for: RAQ762  No results found for: UQJ335  No results found for: KTG256  No results found for: CA2729  No components found for: HGQUANT  No results found for: CEA1 / No results found for: CEA1   No results found for: AFPTUMOR  No results found for: CHROMOGRNA  No results found for: PSA1  No visits with results within 3 Day(s) from this visit.  Latest known visit with results is:  Admission on 01/28/2017, Discharged on 01/28/2017  Component Date Value Ref  Range Status  . WBC 01/27/2017 9.1  4.0 - 10.5 K/uL Final  . RBC 01/27/2017 4.71  3.87 - 5.11 MIL/uL Final  . Hemoglobin 01/27/2017 11.9* 12.0 - 15.0 g/dL Final  . HCT 01/27/2017 36.9  36.0 - 46.0 % Final  . MCV 01/27/2017 78.3  78.0 - 100.0 fL Final  .  MCH 01/27/2017 25.3* 26.0 - 34.0 pg Final  . MCHC 01/27/2017 32.2  30.0 - 36.0 g/dL Final  . RDW 01/27/2017 15.0  11.5 - 15.5 % Final  . Platelets 01/27/2017 299  150 - 400 K/uL Final  . Neutrophils Relative % 01/27/2017 64  % Final  . Neutro Abs 01/27/2017 5.8  1.7 - 7.7 K/uL Final  . Lymphocytes Relative 01/27/2017 24  % Final  . Lymphs Abs 01/27/2017 2.2  0.7 - 4.0 K/uL Final  . Monocytes Relative 01/27/2017 11  % Final  . Monocytes Absolute 01/27/2017 1.0  0.1 - 1.0 K/uL Final  . Eosinophils Relative 01/27/2017 1  % Final  . Eosinophils Absolute 01/27/2017 0.1  0.0 - 0.7 K/uL Final  . Basophils Relative 01/27/2017 0  % Final  . Basophils Absolute 01/27/2017 0.0  0.0 - 0.1 K/uL Final  . Sodium 01/27/2017 135  135 - 145 mmol/L Final  . Potassium 01/27/2017 4.1  3.5 - 5.1 mmol/L Final  . Chloride 01/27/2017 104  101 - 111 mmol/L Final  . CO2 01/27/2017 25  22 - 32 mmol/L Final  . Glucose, Bld 01/27/2017 88  65 - 99 mg/dL Final  . BUN 01/27/2017 12  6 - 20 mg/dL Final  . Creatinine, Ser 01/27/2017 0.76  0.44 - 1.00 mg/dL Final  . Calcium 01/27/2017 9.3  8.9 - 10.3 mg/dL Final  . Total Protein 01/27/2017 7.5  6.5 - 8.1 g/dL Final  . Albumin 01/27/2017 3.9  3.5 - 5.0 g/dL Final  . AST 01/27/2017 22  15 - 41 U/L Final  . ALT 01/27/2017 21  14 - 54 U/L Final  . Alkaline Phosphatase 01/27/2017 86  38 - 126 U/L Final  . Total Bilirubin 01/27/2017 0.5  0.3 - 1.2 mg/dL Final  . GFR calc non Af Amer 01/27/2017 >60  >60 mL/min Final  . GFR calc Af Amer 01/27/2017 >60  >60 mL/min Final   Comment: (NOTE) The eGFR has been calculated using the CKD EPI equation. This calculation has not been validated in all clinical situations. eGFR's  persistently <60 mL/min signify possible Chronic Kidney Disease.   . Anion gap 01/27/2017 6  5 - 15 Final    (this displays the last labs from the last 3 days)  No results found for: TOTALPROTELP, ALBUMINELP, A1GS, A2GS, BETS, BETA2SER, GAMS, MSPIKE, SPEI (this displays SPEP labs)  No results found for: KPAFRELGTCHN, LAMBDASER, KAPLAMBRATIO (kappa/lambda light chains)  No results found for: HGBA, HGBA2QUANT, HGBFQUANT, HGBSQUAN (Hemoglobinopathy evaluation)   No results found for: LDH  No results found for: IRON, TIBC, IRONPCTSAT (Iron and TIBC)  No results found for: FERRITIN  Urinalysis    Component Value Date/Time   COLORURINE STRAW (A) 03/13/2007 0601   APPEARANCEUR CLOUDY (A) 03/13/2007 0601   LABSPEC 1.020 03/13/2007 0601   PHURINE 7.5 03/13/2007 0601   GLUCOSEU NEGATIVE 03/13/2007 0601   HGBUR NEGATIVE 03/13/2007 0601   BILIRUBINUR NEGATIVE 03/13/2007 0601   KETONESUR >80 (A) 03/13/2007 0601   PROTEINUR NEGATIVE 03/13/2007 0601   UROBILINOGEN 0.2 03/13/2007 0601   NITRITE NEGATIVE 03/13/2007 0601   LEUKOCYTESUR  03/13/2007 0601    NEGATIVE MICROSCOPIC NOT DONE ON URINES WITH NEGATIVE PROTEIN, BLOOD, LEUKOCYTES, NITRITE, OR GLUCOSE <1000 mg/dL.     STUDIES: Ct Chest W Contrast  Result Date: 02/06/2017 CLINICAL DATA:  Breast cancer with suspected metastases EXAM: CT CHEST WITH CONTRAST TECHNIQUE: Multidetector CT imaging of the chest was performed during intravenous contrast administration. CONTRAST:  71m ISOVUE-300 IOPAMIDOL (  ISOVUE-300) INJECTION 61% COMPARISON:  CTA chest dated 03/13/2007 FINDINGS: Cardiovascular: The heart is normal in size. No pericardial effusion. Right chest port terminates the cavoatrial junction. Mediastinum/Nodes: No suspicious mediastinal or hilar lymphadenopathy. Left axillary lymph nodes measuring up to 2.7 cm short axis (series 3/image 37). Left subpectoral nodes measuring up to 1.3 cm short axis (series 3/image 30). Lungs/Pleura:  No  suspicious pulmonary nodules. No focal consolidation. No pleural effusion or pneumothorax. Upper Abdomen: Central gallstones (series 3/image 142). Otherwise unremarkable. Musculoskeletal: 4.6 x 4.9 cm mass in the medial left breast (series 3/image 46). Overlying skin thickening. Mild degenerative changes of the upper thoracic spine. IMPRESSION: 4.6 x 4.9 cm mass in the medial left breast, corresponding to known primary breast neoplasm. Overlying left breast skin thickening, likely reflecting post-treatment changes. Associated left axillary and subpectoral nodal metastases. Electronically Signed   By: Julian Hy M.D.   On: 02/06/2017 09:20   Mr Jeri Cos WJ Contrast  Result Date: 02/06/2017 CLINICAL DATA:  Breast cancer, persistent headaches. Assess for intracranial metastasis. EXAM: MRI HEAD WITHOUT AND WITH CONTRAST TECHNIQUE: Multiplanar, multiecho pulse sequences of the brain and surrounding structures were obtained without and with intravenous contrast. CONTRAST:  47m MULTIHANCE GADOBENATE DIMEGLUMINE 529 MG/ML IV SOLN COMPARISON:  None. FINDINGS: Motion degraded postcontrast imaging. INTRACRANIAL CONTENTS: No reduced diffusion to suggest acute ischemia or hypercellular tumor. No susceptibility artifact to suggest hemorrhage. Susceptibility artifact along the anterior falx compatible with dural calcifications The ventricles and sulci are normal for patient's age. No suspicious parenchymal signal, masses, mass effect. No abnormal intraparenchymal or extra-axial enhancement. A few nonspecific punctate supratentorial white matter FLAIR T2 hyperintensities. No abnormal extra-axial fluid collections. No extra-axial masses. VASCULAR: Normal major intracranial vascular flow voids present at skull base. SKULL AND UPPER CERVICAL SPINE: No abnormal sellar expansion. No suspicious calvarial bone marrow signal. Craniocervical junction maintained. SINUSES/ORBITS: The mastoid air-cells and included paranasal sinuses  are well-aerated.The included ocular globes and orbital contents are non-suspicious. OTHER: None. IMPRESSION: No intracranial metastasis; negative mildly motion degraded MRI of the head with and without contrast. Electronically Signed   By: CElon AlasM.D.   On: 02/06/2017 04:14   Nm Bone Scan Whole Body  Result Date: 02/05/2017 CLINICAL DATA:  LEFT breast cancer pre chemotherapy, diabetes mellitus EXAM: NUCLEAR MEDICINE WHOLE BODY BONE SCAN TECHNIQUE: Whole body anterior and posterior images were obtained approximately 3 hours after intravenous injection of radiopharmaceutical. RADIOPHARMACEUTICALS:  19.8 mCi Technetium-935mDP IV COMPARISON:  None Radiographic correlation:  CT chest 02/05/2017 FINDINGS: Uptake in the shoulders, elbows, knees and feet typically degenerative. Uptake in the lumbar spine at approximately L2, nonspecific. Uptake in the lower lumbar spine bilaterally at L4 favor degenerative though metastatic disease not excluded. Questionable abnormal uptake at single anterior BILATERAL ribs. Questionable foci of abnormal increased tracer localization at the inferior LEFT scapula without CT correlate. No other definite sites of abnormal osseous tracer accumulation. Uptake of tracer in the breasts bilaterally which which is nonspecific, on the LEFT likely related to trabeculation and skin thickening as seen on CT. Expected urinary tract excretion of tracer. IMPRESSION: Scattered degenerative type uptake with additional nonspecific tracer localization at L2 vertebral body, BILATERAL anterior ribs, and questionably the inferior LEFT scapula. Osseous metastases not excluded. Consider MR imaging the lumbar spine to exclude vertebral metastases. Electronically Signed   By: MaLavonia Dana.D.   On: 02/05/2017 19:23   Mr Breast Bilateral W Wo Contrast  Addendum Date: 01/22/2017   ADDENDUM REPORT: 01/22/2017 15:35 ADDENDUM:  An addendum is made to the findings portion of the report due to a LEFT/  RIGHT type which should read as: Left breast: Extensive masslike and non masslike enhancement predominantly involving the upper inner LEFT breast and extending inferiorly to involve the entire central to lower left breast compatible with known malignancy. Biopsy clip artifact is present in the outer left breast at site of biopsied calcifications as well as inner left breast at site of biopsied mass. There is enlargement of the left breast as compared to the right with both skin and trabecular edema, however no abnormal enhancement of the skin or nipple-areola complex. There is no involvement of the pectoralis muscle. This mass cannot be discretely measured given the extensive nature of the disease, however spans approximately 12 cm in the AP dimension. Electronically Signed   By: Everlean Alstrom M.D.   On: 01/22/2017 15:35   Result Date: 01/22/2017 CLINICAL DATA:  46 year old female with recently diagnosed left breast cancer after presenting for diagnostic mammography for palpable abnormality 01/09/2017. Extensive calcifications were noted throughout the left breast as well as a suspicious mass in the left breast at the 9:30 position. Stereotactic guided biopsy of the calcifications in the lower outer left breast demonstrated high grade ductal carcinoma in situ and ultrasound-guided biopsy of the mass in the left breast at the 9:30 position demonstrated invasive ductal carcinoma. A biopsy left axillary lymph node was positive for metastatic disease. LABS:  Not applicable. EXAM: BILATERAL BREAST MRI WITH AND WITHOUT CONTRAST TECHNIQUE: Multiplanar, multisequence MR images of both breasts were obtained prior to and following the intravenous administration of 20 ml of MultiHance. THREE-DIMENSIONAL MR IMAGE RENDERING ON INDEPENDENT WORKSTATION: Three-dimensional MR images were rendered by post-processing of the original MR data on an independent workstation. The three-dimensional MR images were interpreted, and  findings are reported in the following complete MRI report for this study. Three dimensional images were evaluated at the independent DynaCad workstation COMPARISON:  Previous exam(s). FINDINGS: Breast composition: c.  Heterogeneous fibroglandular tissue. Background parenchymal enhancement: Moderate. Right breast: No suspicious rapidly enhancing masses or abnormal areas of enhancement in the right breast to suggest malignancy. Left breast: Extensive masslike and non masslike enhancement predominantly involving the upper inner right breast and extending inferiorly to involve the entire central to lower left breast compatible with known malignancy. Biopsy clip artifact is present in the outer left breast at site of biopsied calcifications as well as inner left breast at site of biopsied mass. There is enlargement of the left breast as compared to the right with both skin and trabecular edema, however no abnormal enhancement of the skin or nipple-areola complex. There is no involvement of the pectoralis muscle. This mass cannot be discretely measured given the extensive nature of the disease, however spans approximately 12 cm in the AP dimension. Lymph nodes: There is bulky left axillary lymphadenopathy, with the largest left axillary lymph node measuring up to 3.4 cm. Ancillary findings: Subcentimeter nonenhancing T2 bright lesions are seen in the liver which may represent small cysts. IMPRESSION: 1. Extensive malignancy throughout the left breast with bulky left axillary lymphadenopathy. 2.  No MRI evidence of malignancy in the right breast. RECOMMENDATION: Treatment plan for known left breast malignancy. BI-RADS CATEGORY  6: Known biopsy-proven malignancy. Electronically Signed: By: Everlean Alstrom M.D. On: 01/22/2017 15:25   Dg Chest Port 1 View  Result Date: 01/28/2017 CLINICAL DATA:  Status post Port-A-Cath placement. EXAM: PORTABLE CHEST 1 VIEW COMPARISON:  03/13/2007. FINDINGS: 1551 hours. Low volume  film.  The cardio pericardial silhouette is enlarged. There is pulmonary vascular congestion without overt pulmonary edema. Component of underlying pulmonary edema cannot be excluded on this portable film. Right Port-A-Cath tip overlies the upper right atrium. No evidence for pneumothorax. Gas around the port device is compatible with the recent placement. IMPRESSION: Right Port-A-Cath tip overlies the upper right atrium. No evidence for pneumothorax. Cardiomegaly with vascular congestion. Interstitial edema not excluded. Electronically Signed   By: Misty Stanley M.D.   On: 01/28/2017 16:06   Dg Fluoro Guide Cv Line-no Report  Result Date: 01/28/2017 Fluoroscopy was utilized by the requesting physician.  No radiographic interpretation.   US Breast Ltd Uni Left Inc Axilla  Result Date: 01/09/2017 CLINICAL DATA:  Left breast palpable area felt by the patient approximately 1 week ago. EXAM: 2D DIGITAL DIAGNOSTIC BILATERAL MAMMOGRAM WITH CAD AND ADJUNCT TOMO ULTRASOUND LEFT BREAST COMPARISON:  Previous exam(s). ACR Breast Density Category c: The breast tissue is heterogeneously dense, which may obscure small masses. FINDINGS: Mammographically, there are no suspicious masses, areas of architectural distortion or microcalcifications in the right breast. There is a regional asymmetry in the left central and upper inner breast, anterior and middle depth. Malignant type calcifications are seen involving all 4 quadrants of the breast and spanning the thickness of the breast with the most clustering in the upper inner quadrant, which corresponds to the area of palpable concern. There is anterior skin thickening and slight trabecular thickening. Mammographic images were processed with CAD. On physical exam, there is firmness and diffuse palpable thickening in the anterior left breast. Skin dimpling is also seen in the upper inner left breast, anterior depth. Targeted ultrasound is performed, showing left breast 930 o'clock 3 cm  from the nipple, the area of palpable concern, irregular hypoechoic fragmented mass which measures 2.7 x 2.6 x 3.1 cm. In the left axilla there are 4 abnormal lymph nodes, the largest of which measures 2.7 cm in long axis, and demonstrates complete effacement of its hilum. 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 No mammographic evidence of malignancy in the right breast. Malignant left axillary lymphadenopathy. RECOMMENDATION: Ultrasound-guided core needle biopsy of left breast 930 o'clock mass and the largest abnormal left axillary lymph node. Stereotactic core needle biopsy of group of malignant appearing calcifications in the lower outer left breast, to establish extent of disease. I have discussed the findings and recommendations with the patient. Results were also provided in writing at the conclusion of the visit. If applicable, a reminder letter will be sent to the patient regarding the next appointment. BI-RADS CATEGORY  5: Highly suggestive of malignancy. Electronically Signed   By: Fidela Salisbury M.D.   On: 01/09/2017 09:19   Mm Diag Breast Tomo Bilateral  Result Date: 01/09/2017 CLINICAL DATA:  Left breast palpable area felt by the patient approximately 1 week ago. EXAM: 2D DIGITAL DIAGNOSTIC BILATERAL MAMMOGRAM WITH CAD AND ADJUNCT TOMO ULTRASOUND LEFT BREAST COMPARISON:  Previous exam(s). ACR Breast Density Category c: The breast tissue is heterogeneously dense, which may obscure small masses. FINDINGS: Mammographically, there are no suspicious masses, areas of architectural distortion or microcalcifications in the right breast. There is a regional asymmetry in the left central and upper inner breast, anterior and middle depth. Malignant type calcifications are seen involving all 4 quadrants of the  breast and spanning the  thickness of the breast with the most clustering in the upper inner quadrant, which corresponds to the area of palpable concern. There is anterior skin thickening and slight trabecular thickening. Mammographic images were processed with CAD. On physical exam, there is firmness and diffuse palpable thickening in the anterior left breast. Skin dimpling is also seen in the upper inner left breast, anterior depth. Targeted ultrasound is performed, showing left breast 930 o'clock 3 cm from the nipple, the area of palpable concern, irregular hypoechoic fragmented mass which measures 2.7 x 2.6 x 3.1 cm. In the left axilla there are 4 abnormal lymph nodes, the largest of which measures 2.7 cm in long axis, and demonstrates complete effacement of its hilum. 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 No mammographic evidence of malignancy in the right breast. Malignant left axillary lymphadenopathy. RECOMMENDATION: Ultrasound-guided core needle biopsy of left breast 930 o'clock mass and the largest abnormal left axillary lymph node. Stereotactic core needle biopsy of group of malignant appearing calcifications in the lower outer left breast, to establish extent of disease. I have discussed the findings and recommendations with the patient. Results were also provided in writing at the conclusion of the visit. If applicable, a reminder letter will be sent to the patient regarding the next appointment. BI-RADS CATEGORY  5: Highly suggestive of malignancy. Electronically Signed   By: Fidela Salisbury M.D.   On: 01/09/2017 09:19   [name] Axillary Node Core Biopsy Left  Result Date: 01/14/2017 CLINICAL DATA:  Patient with cortically thickened left axillary lymph node. EXAM: ULTRASOUND GUIDED CORE NEEDLE BIOPSY OF A  LEFT AXILLARY NODE COMPARISON:  Previous exam(s). FINDINGS: I met with the patient and we discussed the procedure of ultrasound-guided biopsy, including benefits and alternatives. We discussed the high likelihood of a successful procedure. We discussed the risks of the procedure, including infection, bleeding, tissue injury, clip migration, and inadequate sampling. Informed written consent was given. The usual time-out protocol was performed immediately prior to the procedure. Using sterile technique and 1% Lidocaine as local anesthetic, under direct ultrasound visualization, a 14 gauge spring-loaded device was used to perform biopsy of left axillary lymph node using a lateral approach. At the conclusion of the procedure a HydroMARK tissue marker clip was deployed into the biopsy cavity. Follow up 2 view mammogram was performed and dictated separately. IMPRESSION: Ultrasound guided biopsy of left axillary lymph node. No apparent complications. Electronically Signed   By: Lovey Newcomer M.D.   On: 01/14/2017 12:53   Mm Clip Placement Left  Result Date: 01/14/2017 CLINICAL DATA:  Patient status post ultrasound-guided biopsy palpable left breast mass, stereotactic guided biopsy left breast calcifications and ultrasound-guided biopsy left axillary lymph node. EXAM: DIAGNOSTIC LEFT MAMMOGRAM POST ULTRASOUND AND STEREOTACTIC BIOPSIES COMPARISON:  Previous exam(s). FINDINGS: Mammographic images were obtained following ultrasound guided biopsy of palpable left breast mass 930 o'clock and left axillary lymph node as well as stereotactic guided biopsy left breast calcifications. Site 1: Calcifications lower outer left breast. Coil shaped marking clip in appropriate position. Site 2: Palpable mass 930 o'clock. Ribbon shaped marking clip in appropriate position. Site 3: Left axillary lymph node.  HydroMARK clip in appropriate position. IMPRESSION: Site 1: Calcifications lower outer left breast. Coil shaped marking clip in  appropriate position. Site 2: Palpable mass 930 o'clock. Ribbon shaped marking clip in appropriate position. Site 3:  Left axillary lymph node. HydroMARK clip in appropriate position. Final Assessment: Post Procedure Mammograms for Marker Placement Electronically Signed   By: Annia Belt M.D.   On: 01/14/2017 13:00   Mm Lt Breast Bx W Loc Dev 1st Lesion Image Bx Spec Stereo Guide  Result Date: 01/14/2017 CLINICAL DATA:  Patient with suspicious left breast calcifications. EXAM: LEFT BREAST STEREOTACTIC CORE NEEDLE BIOPSY COMPARISON:  Previous exams. FINDINGS: The patient and I discussed the procedure of stereotactic-guided biopsy including benefits and alternatives. We discussed the high likelihood of a successful procedure. We discussed the risks of the procedure including infection, bleeding, tissue injury, clip migration, and inadequate sampling. Informed written consent was given. The usual time out protocol was performed immediately prior to the procedure. Using sterile technique and 1% Lidocaine as local anesthetic, under stereotactic guidance, a 9 gauge vacuum assisted device was used to perform core needle biopsy of calcifications in the lower outer left breast using a cranial approach. Specimen radiograph was performed showing calcifications. Specimens with calcifications are identified for pathology. Lesion quadrant: Lower outer quadrant At the conclusion of the procedure, a coil shaped tissue marker clip was deployed into the biopsy cavity. Follow-up 2-view mammogram was performed and dictated separately. IMPRESSION: Stereotactic-guided biopsy of left breast calcifications. No apparent complications. Electronically Signed   By: Annia Belt M.D.   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 GRADE I-II INVASIVE DUCTAL CARCINOMA WITH EXTRACELLULAR MUCIN of the Left breast, 9:30 o'clock. METASTATIC CARCINOMA IN ONE LYMPH NODE of the Left axilla. This was found to be concordant by Dr. Annia Belt. Pathology results were discussed with the patient by telephone. The patient reported doing well after the biopsies with tenderness at the sites. Post biopsy instructions and care were reviewed and questions were answered. The patient was encouraged to call [facility] for any additional concerns. Surgical consultation has been arranged with Dr. Harriette Bouillon at [facility] on January 21, 2017. Recommendation for bilateral breast MRI to evaluate extent of disease. Pathology results reported by Rene Kocher, RN on 01/15/2017. Electronically Signed   By: Annia Belt M.D.   On: 01/15/2017 14:45   Result Date: 01/16/2017 CLINICAL DATA:  Patient with suspicious palpable left breast mass. EXAM: ULTRASOUND GUIDED LEFT BREAST CORE NEEDLE BIOPSY COMPARISON:  Previous exam(s). FINDINGS: I met with the patient and we discussed the procedure of ultrasound-guided biopsy, including benefits and alternatives. We discussed the high likelihood of a successful procedure. We discussed the risks of the procedure, including infection, bleeding, tissue injury, clip migration, and inadequate sampling. Informed written consent was given. The usual time-out protocol was performed immediately prior to the procedure. Lesion quadrant: Upper inner quadrant Using sterile technique and 1% Lidocaine as local anesthetic, under direct ultrasound visualization, a 12 gauge spring-loaded device was used to perform biopsy of left breast mass 930 o'clock using a medial approach. At the conclusion of the procedure a ribbon shaped tissue marker clip was deployed into the biopsy cavity. Follow up 2 view mammogram was performed and dictated separately. IMPRESSION: Ultrasound guided biopsy of left breast mass  930 o'clock. No apparent complications. Electronically Signed: By: Annia Belt M.D. On: 01/14/2017 12:56    ELIGIBLE FOR AVAILABLE RESEARCH PROTOCOL: no  ASSESSMENT: 46  y.o. [place] woman status post left breast upper inner quadrant biopsy 01/14/2017 for a clinical T3 N2, stage IIA invasive ductal carcinoma, grade 1 or 2, estrogen and progesterone receptor positive, HER-2 nonamplified, with an MIB-1 of 20%.  (1) staging studies: Brain MRI, bone scan, and CT scan of the chest 02/05/2017 showed no brain lesions, no lung or liver lesions, a 4.9 cm mass in the left breast with left axillary and subpectoral adenopathy, and nonspecific bone scan tracer at L2, left scapula, and anterior ribs, with lumbar spine MRI suggested for further evaluation.  (2) neoadjuvant chemotherapy will consist of cyclophosphamide and doxorubicin in dose dense fashion 4 starting 02/10/2017, followed by weekly paclitaxel 12  (3) definitive surgery to follow, most likely left modified radical mastectomy  (4) postmastectomy radiation is indicated  (5) adjuvant anti-estrogens to follow  (6) genetics testing pending  PLAN: I spent approximately 35 minutes with Mackenzie [name] and her family going over her situation. We reviewed the results of her staging studies, which fairly confirm what we expected previously. There are some nonspecific bone lesions which we will evaluate with an MRI of the lumbar spine sometime next week. At this point however she has T3 N2 disease.  We then reviewed her treatment plan. She will start doxorubicin and cyclophosphamide 02/10/2017. She will receive on Pro that day, with the shot administered the next day. I gave her a copy of the "roadmap" showing how to take all her supportive medicines, and we discussed that in detail. I went ahead and placed all the prescriptions in for her. I also updated her medication list, which included some herbal products that were contraindicated.  I have asked her  to keep a diary of side effects so when she returns to see [name] in week after her first treatment we can most effectively a review her tolerance and make any changes that need to be made her subsequent cycles are easier on her  She knows to call for any problems that may develop before that visit.  Chauncey Cruel, MD   02/06/2017 11:55 AM Medical Oncology and Hematology [facility] [address] [place], [place] [address] Tel. [phone]    Fax. [phone]

## 2017-02-10 ENCOUNTER — Encounter: Payer: Self-pay | Admitting: *Deleted

## 2017-02-10 ENCOUNTER — Ambulatory Visit (HOSPITAL_BASED_OUTPATIENT_CLINIC_OR_DEPARTMENT_OTHER): Payer: 59

## 2017-02-10 ENCOUNTER — Ambulatory Visit: Payer: 59

## 2017-02-10 ENCOUNTER — Telehealth: Payer: Self-pay | Admitting: Adult Health

## 2017-02-10 ENCOUNTER — Other Ambulatory Visit (HOSPITAL_BASED_OUTPATIENT_CLINIC_OR_DEPARTMENT_OTHER): Payer: 59

## 2017-02-10 VITALS — BP 132/78 | HR 69 | Temp 98.0°F | Resp 18

## 2017-02-10 DIAGNOSIS — C50812 Malignant neoplasm of overlapping sites of left female breast: Secondary | ICD-10-CM

## 2017-02-10 DIAGNOSIS — Z95828 Presence of other vascular implants and grafts: Secondary | ICD-10-CM | POA: Insufficient documentation

## 2017-02-10 DIAGNOSIS — C773 Secondary and unspecified malignant neoplasm of axilla and upper limb lymph nodes: Principal | ICD-10-CM

## 2017-02-10 DIAGNOSIS — Z5111 Encounter for antineoplastic chemotherapy: Secondary | ICD-10-CM

## 2017-02-10 DIAGNOSIS — C50912 Malignant neoplasm of unspecified site of left female breast: Secondary | ICD-10-CM

## 2017-02-10 DIAGNOSIS — D0512 Intraductal carcinoma in situ of left breast: Secondary | ICD-10-CM

## 2017-02-10 DIAGNOSIS — Z170 Estrogen receptor positive status [ER+]: Secondary | ICD-10-CM

## 2017-02-10 LAB — CBC WITH DIFFERENTIAL/PLATELET
BASO%: 0.8 % (ref 0.0–2.0)
Basophils Absolute: 0.1 10*3/uL (ref 0.0–0.1)
EOS%: 1.3 % (ref 0.0–7.0)
Eosinophils Absolute: 0.1 10*3/uL (ref 0.0–0.5)
HCT: 35.9 % (ref 34.8–46.6)
HEMOGLOBIN: 11.9 g/dL (ref 11.6–15.9)
LYMPH%: 24.8 % (ref 14.0–49.7)
MCH: 26.2 pg (ref 25.1–34.0)
MCHC: 33 g/dL (ref 31.5–36.0)
MCV: 79.2 fL — ABNORMAL LOW (ref 79.5–101.0)
MONO#: 0.5 10*3/uL (ref 0.1–0.9)
MONO%: 8.3 % (ref 0.0–14.0)
NEUT%: 64.8 % (ref 38.4–76.8)
NEUTROS ABS: 4.2 10*3/uL (ref 1.5–6.5)
Platelets: 229 10*3/uL (ref 145–400)
RBC: 4.53 10*6/uL (ref 3.70–5.45)
RDW: 15.4 % — AB (ref 11.2–14.5)
WBC: 6.4 10*3/uL (ref 3.9–10.3)
lymph#: 1.6 10*3/uL (ref 0.9–3.3)

## 2017-02-10 LAB — COMPREHENSIVE METABOLIC PANEL
ALBUMIN: 3.6 g/dL (ref 3.5–5.0)
ALT: 29 U/L (ref 0–55)
AST: 21 U/L (ref 5–34)
Alkaline Phosphatase: 102 U/L (ref 40–150)
Anion Gap: 6 mEq/L (ref 3–11)
BUN: 14 mg/dL (ref 7.0–26.0)
CO2: 25 mEq/L (ref 22–29)
Calcium: 9.6 mg/dL (ref 8.4–10.4)
Chloride: 105 mEq/L (ref 98–109)
Creatinine: 0.8 mg/dL (ref 0.6–1.1)
EGFR: >90 mL/min/{1.73_m2} (ref >90)
GLUCOSE: 113 mg/dL (ref 70–140)
POTASSIUM: 3.7 meq/L (ref 3.5–5.1)
SODIUM: 137 meq/L (ref 136–145)
TOTAL PROTEIN: 7.6 g/dL (ref 6.4–8.3)
Total Bilirubin: 0.29 mg/dL (ref 0.20–1.20)

## 2017-02-10 MED ORDER — SODIUM CHLORIDE 0.9% FLUSH
10.0000 mL | INTRAVENOUS | Status: DC | PRN
  Administered 2017-02-10: 10 mL via INTRAVENOUS
  Filled 2017-02-10: qty 10

## 2017-02-10 MED ORDER — SODIUM CHLORIDE 0.9 % IV SOLN
Freq: Once | INTRAVENOUS | Status: AC
  Administered 2017-02-10: 11:00:00 via INTRAVENOUS

## 2017-02-10 MED ORDER — PALONOSETRON HCL INJECTION 0.25 MG/5ML
0.2500 mg | Freq: Once | INTRAVENOUS | Status: AC
  Administered 2017-02-10: 0.25 mg via INTRAVENOUS

## 2017-02-10 MED ORDER — HEPARIN SOD (PORK) LOCK FLUSH 100 UNIT/ML IV SOLN
500.0000 [IU] | Freq: Once | INTRAVENOUS | Status: AC | PRN
  Administered 2017-02-10: 500 [IU]
  Filled 2017-02-10: qty 5

## 2017-02-10 MED ORDER — PEGFILGRASTIM 6 MG/0.6ML ~~LOC~~ PSKT
6.0000 mg | PREFILLED_SYRINGE | Freq: Once | SUBCUTANEOUS | Status: AC
  Administered 2017-02-10: 6 mg via SUBCUTANEOUS
  Filled 2017-02-10: qty 0.6

## 2017-02-10 MED ORDER — PALONOSETRON HCL INJECTION 0.25 MG/5ML
INTRAVENOUS | Status: AC
  Filled 2017-02-10: qty 5

## 2017-02-10 MED ORDER — PALONOSETRON HCL INJECTION 0.25 MG/5ML
INTRAVENOUS | Status: AC
Start: 2017-02-10 — End: 2017-02-10
  Filled 2017-02-10: qty 5

## 2017-02-10 MED ORDER — SODIUM CHLORIDE 0.9% FLUSH
10.0000 mL | INTRAVENOUS | Status: DC | PRN
  Administered 2017-02-10: 10 mL
  Filled 2017-02-10: qty 10

## 2017-02-10 MED ORDER — CYCLOPHOSPHAMIDE CHEMO INJECTION 1 GM
600.0000 mg/m2 | Freq: Once | INTRAMUSCULAR | Status: AC
  Administered 2017-02-10: 1460 mg via INTRAVENOUS
  Filled 2017-02-10: qty 73

## 2017-02-10 MED ORDER — SODIUM CHLORIDE 0.9 % IV SOLN
Freq: Once | INTRAVENOUS | Status: AC
  Administered 2017-02-10: 12:00:00 via INTRAVENOUS
  Filled 2017-02-10: qty 5

## 2017-02-10 MED ORDER — DOXORUBICIN HCL CHEMO IV INJECTION 2 MG/ML
60.0000 mg/m2 | Freq: Once | INTRAVENOUS | Status: AC
  Administered 2017-02-10: 146 mg via INTRAVENOUS
  Filled 2017-02-10: qty 73

## 2017-02-10 NOTE — Telephone Encounter (Signed)
Added flush appt per 9/4 sch message - left message with appt time and date.

## 2017-02-10 NOTE — Patient Instructions (Signed)
[facility] Discharge Instructions for Patients Receiving Chemotherapy  Today you received the following chemotherapy agents adriamycin and cytoxan.  To help prevent nausea and vomiting after your treatment, we encourage you to take your nausea medication compazine.   If you develop nausea and vomiting that is not controlled by your nausea medication, call the clinic.   BELOW ARE SYMPTOMS THAT SHOULD BE REPORTED IMMEDIATELY:  *FEVER GREATER THAN 100.5 F  *CHILLS WITH OR WITHOUT FEVER  NAUSEA AND VOMITING THAT IS NOT CONTROLLED WITH YOUR NAUSEA MEDICATION  *UNUSUAL SHORTNESS OF BREATH  *UNUSUAL BRUISING OR BLEEDING  TENDERNESS IN MOUTH AND THROAT WITH OR WITHOUT PRESENCE OF ULCERS  *URINARY PROBLEMS  *BOWEL PROBLEMS  UNUSUAL RASH Items with * indicate a potential emergency and should be followed up as soon as possible.  Feel free to call the clinic you have any questions or concerns. The clinic phone number is [phone].  Please show the [place] at check-in to the Emergency Department and triage nurse.

## 2017-02-11 ENCOUNTER — Other Ambulatory Visit: Payer: Self-pay | Admitting: *Deleted

## 2017-02-11 ENCOUNTER — Telehealth: Payer: Self-pay

## 2017-02-11 DIAGNOSIS — Z170 Estrogen receptor positive status [ER+]: Principal | ICD-10-CM

## 2017-02-11 DIAGNOSIS — C50821 Malignant neoplasm of overlapping sites of right male breast: Secondary | ICD-10-CM

## 2017-02-11 MED ORDER — LORATADINE 10 MG PO TABS: 10.0000 mg | ORAL_TABLET | Freq: Every day | ORAL | 2 refills | Status: AC

## 2017-02-11 NOTE — Telephone Encounter (Signed)
See flowsheet for chemo f/u call

## 2017-02-17 ENCOUNTER — Encounter: Payer: Self-pay | Admitting: *Deleted

## 2017-02-17 ENCOUNTER — Other Ambulatory Visit (HOSPITAL_BASED_OUTPATIENT_CLINIC_OR_DEPARTMENT_OTHER): Payer: 59

## 2017-02-17 ENCOUNTER — Encounter: Payer: Self-pay | Admitting: Genetics

## 2017-02-17 ENCOUNTER — Encounter: Payer: Self-pay | Admitting: Adult Health

## 2017-02-17 ENCOUNTER — Ambulatory Visit (HOSPITAL_BASED_OUTPATIENT_CLINIC_OR_DEPARTMENT_OTHER): Payer: 59 | Admitting: Genetics

## 2017-02-17 ENCOUNTER — Ambulatory Visit (HOSPITAL_COMMUNITY)
Admission: RE | Admit: 2017-02-17 | Discharge: 2017-02-17 | Disposition: A | Discharge status: Home / Self Care | Payer: 59 | Source: Ambulatory Visit | Attending: Oncology | Admitting: Oncology

## 2017-02-17 ENCOUNTER — Ambulatory Visit (HOSPITAL_BASED_OUTPATIENT_CLINIC_OR_DEPARTMENT_OTHER): Payer: 59 | Admitting: Adult Health

## 2017-02-17 VITALS — BP 150/76 | HR 99 | Temp 98.1°F | Resp 20 | Ht 66.0 in | Wt 278.4 lb

## 2017-02-17 DIAGNOSIS — Z170 Estrogen receptor positive status [ER+]: Secondary | ICD-10-CM

## 2017-02-17 DIAGNOSIS — Z95828 Presence of other vascular implants and grafts: Secondary | ICD-10-CM

## 2017-02-17 DIAGNOSIS — Z803 Family history of malignant neoplasm of breast: Secondary | ICD-10-CM

## 2017-02-17 DIAGNOSIS — C50812 Malignant neoplasm of overlapping sites of left female breast: Secondary | ICD-10-CM | POA: Insufficient documentation

## 2017-02-17 DIAGNOSIS — C50912 Malignant neoplasm of unspecified site of left female breast: Secondary | ICD-10-CM | POA: Diagnosis not present

## 2017-02-17 DIAGNOSIS — B370 Candidal stomatitis: Secondary | ICD-10-CM | POA: Diagnosis not present

## 2017-02-17 DIAGNOSIS — C773 Secondary and unspecified malignant neoplasm of axilla and upper limb lymph nodes: Secondary | ICD-10-CM | POA: Insufficient documentation

## 2017-02-17 DIAGNOSIS — D709 Neutropenia, unspecified: Secondary | ICD-10-CM

## 2017-02-17 DIAGNOSIS — M47816 Spondylosis without myelopathy or radiculopathy, lumbar region: Secondary | ICD-10-CM | POA: Diagnosis not present

## 2017-02-17 LAB — CBC WITH DIFFERENTIAL/PLATELET
BASO%: 0.8 % (ref 0.0–2.0)
BASOS ABS: 0 10*3/uL (ref 0.0–0.1)
EOS%: 3 % (ref 0.0–7.0)
Eosinophils Absolute: 0.1 10*3/uL (ref 0.0–0.5)
HEMATOCRIT: 32.2 % — AB (ref 34.8–46.6)
HGB: 10.5 g/dL — ABNORMAL LOW (ref 11.6–15.9)
LYMPH#: 1 10*3/uL (ref 0.9–3.3)
LYMPH%: 41.9 % (ref 14.0–49.7)
MCH: 25.9 pg (ref 25.1–34.0)
MCHC: 32.6 g/dL (ref 31.5–36.0)
MCV: 79.5 fL (ref 79.5–101.0)
MONO#: 0.1 10*3/uL (ref 0.1–0.9)
MONO%: 2.5 % (ref 0.0–14.0)
NEUT#: 1.2 10*3/uL — ABNORMAL LOW (ref 1.5–6.5)
NEUT%: 51.8 % (ref 38.4–76.8)
Platelets: 173 10*3/uL (ref 145–400)
RBC: 4.05 10*6/uL (ref 3.70–5.45)
RDW: 14.7 % — ABNORMAL HIGH (ref 11.2–14.5)
WBC: 2.4 10*3/uL — ABNORMAL LOW (ref 3.9–10.3)

## 2017-02-17 LAB — COMPREHENSIVE METABOLIC PANEL
ALBUMIN: 3.4 g/dL — AB (ref 3.5–5.0)
ALK PHOS: 124 U/L (ref 40–150)
ALT: 17 U/L (ref 0–55)
ANION GAP: 8 meq/L (ref 3–11)
AST: 11 U/L (ref 5–34)
BILIRUBIN TOTAL: 0.34 mg/dL (ref 0.20–1.20)
BUN: 16.2 mg/dL (ref 7.0–26.0)
CALCIUM: 9.3 mg/dL (ref 8.4–10.4)
CO2: 24 mEq/L (ref 22–29)
CREATININE: 0.7 mg/dL (ref 0.6–1.1)
Chloride: 103 mEq/L (ref 98–109)
EGFR: >90 mL/min/{1.73_m2} (ref >90)
Glucose: 137 mg/dl (ref 70–140)
Potassium: 3.5 mEq/L (ref 3.5–5.1)
Sodium: 134 mEq/L — ABNORMAL LOW (ref 136–145)
TOTAL PROTEIN: 6.9 g/dL (ref 6.4–8.3)

## 2017-02-17 MED ORDER — HEPARIN SOD (PORK) LOCK FLUSH 100 UNIT/ML IV SOLN
500.0000 [IU] | Freq: Once | INTRAVENOUS | Status: AC | PRN
  Administered 2017-02-17: 500 [IU] via INTRAVENOUS
  Filled 2017-02-17: qty 5

## 2017-02-17 MED ORDER — SODIUM CHLORIDE 0.9% FLUSH
10.0000 mL | INTRAVENOUS | Status: DC | PRN
  Administered 2017-02-17: 10 mL via INTRAVENOUS
  Filled 2017-02-17: qty 10

## 2017-02-17 MED ORDER — NYSTATIN 100000 UNIT/ML MT SUSP: 5.0000 mL | Freq: Four times a day (QID) | OROMUCOSAL | 0 refills | Status: DC

## 2017-02-17 MED ORDER — SODIUM CHLORIDE 0.9% FLUSH
10.0000 mL | INTRAVENOUS | Status: DC | PRN
  Filled 2017-02-17: qty 10

## 2017-02-17 MED ORDER — GADOBENATE DIMEGLUMINE 529 MG/ML IV SOLN
20.0000 mL | Freq: Once | INTRAVENOUS | Status: AC | PRN
  Administered 2017-02-17: 20 mL via INTRAVENOUS

## 2017-02-17 MED FILL — NYSTATIN 100,000 UNITS/ML S: 100000 | 3 days supply | Qty: 60 | Fill #0

## 2017-02-17 NOTE — Patient Instructions (Signed)

## 2017-02-17 NOTE — Progress Notes (Signed)
[place]  Telephone:(336) [phone] Fax:(336) [phone]     ID: Mackenzie [name] DOB: [birth year]  MR#: [id number]  OTL#:[id number]  Patient Care Team: Mackenzie Blade, MD as PCP - General (Internal Medicine) Mackenzie [name], Mackenzie Dad, MD as Consulting Physician (Oncology) Mackenzie Luna, MD as Consulting Physician (General Surgery) Mackenzie Rudd, MD as Consulting Physician (Radiation Oncology) Mackenzie Dock, NP OTHER MD:  CHIEF COMPLAINT: Estrogen receptor positive breast cancer  CURRENT TREATMENT: Neoadjuvant chemotherapy pending   HISTORY OF CURRENT ILLNESS: From the original intake note:  The patient herself noted some changes in her left breast late July 2018, she says, and eventually brought this to medical attention so that on 01/09/2017 she underwent bilateral diagnostic mammography with tomography and left breast ultrasonography at the breast Center. This found the breast density to be category C. In the upper inner quadrant of the left breast there was an area of asymmetry and there were malignant type calcifications involving all 4 quadrants. On exam there is firmness and palpable thickening in the anterior left breast with skin dimpling. Ultrasonography found at the 9:30 o'clock radiant 3 cm from the nipple a 2.7 cm mass and in the left axilla for abnormal lymph nodes largest of which measured 2.7 cm.  Biopsy of the left breast 9:30 o'clock mass 80 01/26/2017 showed ([id number]) invasive ductal carcinoma, with extracellular mucin, grade 1 or 2. In the lower outer left breast is separated biopsy the same day showed ductal carcinoma in situ. One of the 4 lymph nodes involved was positive for metastatic carcinoma. Prognostic panel on the invasive disease showed it to be estrogen receptor 100% positive, progesterone receptor 20% positive, both with strong staining intensity, with an MIB-1 of 20%, and no HER-2 amplification with a signals ratio 1.38 and the number per cell  2.90.  On 01/22/2017 the patient underwent bilateral breast MRI. This showed no involvement of the right breast, but in the left breast there was a masslike and non-masslike enhancement involving all quadrants, with skin swelling but no abnormal enhancement of the skin or nipple areolar complex or pectoralis muscle. The mass could not be clearly measured but spanned approximately 12 cm. There was bulky left axillary lymphadenopathy, with the largest lymph node measuring up to 3.4 cm.  The patient's subsequent history is as detailed below.  INTERVAL HISTORY: Mackenzie [name] is here after receiving her first cycle of Doxorubicin and Cyclophosphamide.  She tolerated it well.  She did have some nausea that was managed by her anti-emetics and she does have a sore throat.  She denies any mouth ulcerations, fevers, chills, constipation, diarrhea, or any other concerns.    REVIEW OF SYSTEMS: A detailed review of systems today was otherwise stable  PAST MEDICAL HISTORY: Past Medical History:  Diagnosis Date  . Anemia   . Breast cancer ([place]) 01/14/2017   Left breast  . Diabetes mellitus    GDM    PAST SURGICAL HISTORY: Past Surgical History:  Procedure Laterality Date  . CESAREAN SECTION     x2  . PORTACATH PLACEMENT Right 01/28/2017   Procedure: INSERTION PORT-A-CATH WITH ULTRASOUND;  Surgeon: Mackenzie Luna, MD;  Location: [facility];  Service: General;  Laterality: Right;  . TUBAL LIGATION      FAMILY HISTORY Family History  Problem Relation Age of Onset  . Breast cancer Paternal Grandmother   The patient's father still alive at age 59. The patient's mother died with a brain tumor which the patient says was "benign". She  was 56. The patient has one brother, no sisters. The only breast cancer in the family is a paternal grandmother who died from breast cancer at an unknown age. There is no history of ovarian or prostate cancer in the family.  GYNECOLOGIC HISTORY:  Patient's last  menstrual period was 02/10/2017. Menarche age 43 and first live birth age 40 she is Mackenzie [name] P4. The person is still having regular periods as of August 2018  SOCIAL HISTORY: (As of August 2018) Mackenzie [name] works at [employer] in the fifth floor Mackenzie [name]. Her husband Mackenzie [name] is a Building control surveyor. Daughter and married and lives in [place] and works as a Education administrator. Daughter Mackenzie [name] lives in [place] and works in the police department. Daughters Mackenzie [name] and Mackenzie [name] are 56 and 6, living at home. The patient has no grandchildren. She attends a Tour manager    ADVANCED DIRECTIVES: Not in place   HEALTH MAINTENANCE: Social History  Substance Use Topics  . Smoking status: Never Smoker  . Smokeless tobacco: Never Used  . Alcohol use Yes     Comment: social     Colonoscopy: Never  PAP: December 2017  Bone density: Never   No Known Allergies  Current Outpatient Prescriptions  Medication Sig Dispense Refill  . benzonatate (TESSALON) 100 MG capsule Take 100 mg by mouth as needed. For cough    . cetirizine (ZYRTEC) 10 MG tablet Take 10 mg by mouth daily.      Marland Kitchen dexamethasone (DECADRON) 4 MG tablet Take 2 tablets by mouth once a day on the day after chemotherapy and then take 2 tablets two times a day for 2 days. Take with food. 30 tablet 1  . HYDROcodone-homatropine (HYCODAN) 5-1.5 MG/5ML syrup Take 5 mLs by mouth 3 (three) times daily as needed.    . lidocaine-prilocaine (EMLA) cream Apply to affected area once 30 g 3  . loratadine (CLARITIN) 10 MG tablet Take 1 tablet (10 mg total) by mouth daily. 90 tablet 2  . LORazepam (ATIVAN) 0.5 MG tablet Take 1 tablet (0.5 mg total) by mouth at bedtime as needed (Nausea or vomiting). 30 tablet 0  . montelukast (SINGULAIR) 10 MG tablet Take 10 mg by mouth at bedtime. prn    . Multiple Vitamin (MULTIVITAMIN) tablet Take 1 tablet by mouth daily.    . Omega-3 Fatty Acids (FISH OIL) 1000 MG CAPS Take 1,000 mg by mouth 2 (two) times daily.     Marland Kitchen  oxyCODONE (OXY IR/ROXICODONE) 5 MG immediate release tablet Take 1-2 tablets (5-10 mg total) by mouth every 6 (six) hours as needed for severe pain. 20 tablet 0  . prochlorperazine (COMPAZINE) 10 MG tablet Take 1 tablet (10 mg total) by mouth every 6 (six) hours as needed (Nausea or vomiting). 30 tablet 1   Current Facility-Administered Medications  Medication Dose Route Frequency Provider Last Rate Last Dose  . sodium chloride flush (NS) 0.9 % injection 10 mL  10 mL Intravenous PRN Mackenzie [name], Mackenzie Dad, MD        OBJECTIVE:   Vitals:   02/17/17 1114  BP: (!) 150/76  Pulse: 99  Resp: 20  Temp: 98.1 F (36.7 C)  SpO2: 100%     Body mass index is 44.93 kg/m.   Wt Readings from Last 3 Encounters:  02/17/17 278 lb 6.4 oz (126.3 kg)  02/06/17 278 lb 8 oz (126.3 kg)  01/28/17 279 lb (126.6 kg)      ECOG FS:1 - Symptomatic but completely ambulatory GENERAL: Patient is a well  appearing female in no acute distress HEENT:  Sclerae anicteric.  Oropharynx clear and moist. One small white patch on upper posterior palate  No ulcerations. Neck is supple.  NODES:  No cervical, supraclavicular, or axillary lymphadenopathy palpated.  BREAST EXAM:  Deferred. LUNGS:  Clear to auscultation bilaterally.  No wheezes or rhonchi. HEART:  Regular rate and rhythm. No murmur appreciated. ABDOMEN:  Soft, nontender.  Positive, normoactive bowel sounds. No organomegaly palpated. MSK:  No focal spinal tenderness to palpation. Full range of motion bilaterally in the upper extremities. EXTREMITIES:  No peripheral edema.   SKIN:  Clear with no obvious rashes or skin changes. No nail dyscrasia. NEURO:  Nonfocal. Well oriented.  Appropriate affect.   LAB RESULTS:    No visits with results within 3 Day(s) from this visit.  Latest known visit with results is:  Appointment on 02/10/2017  Component Date Value Ref Range Status  . WBC 02/10/2017 6.4  3.9 - 10.3 10e3/uL Final  . NEUT# 02/10/2017 4.2  1.5 - 6.5  10e3/uL Final  . HGB 02/10/2017 11.9  11.6 - 15.9 g/dL Final  . HCT 02/10/2017 35.9  34.8 - 46.6 % Final  . Platelets 02/10/2017 229  145 - 400 10e3/uL Final  . MCV 02/10/2017 79.2* 79.5 - 101.0 fL Final  . MCH 02/10/2017 26.2  25.1 - 34.0 pg Final  . MCHC 02/10/2017 33.0  31.5 - 36.0 g/dL Final  . RBC 02/10/2017 4.53  3.70 - 5.45 10e6/uL Final  . RDW 02/10/2017 15.4* 11.2 - 14.5 % Final  . lymph# 02/10/2017 1.6  0.9 - 3.3 10e3/uL Final  . MONO# 02/10/2017 0.5  0.1 - 0.9 10e3/uL Final  . Eosinophils Absolute 02/10/2017 0.1  0.0 - 0.5 10e3/uL Final  . Basophils Absolute 02/10/2017 0.1  0.0 - 0.1 10e3/uL Final  . NEUT% 02/10/2017 64.8  38.4 - 76.8 % Final  . LYMPH% 02/10/2017 24.8  14.0 - 49.7 % Final  . MONO% 02/10/2017 8.3  0.0 - 14.0 % Final  . EOS% 02/10/2017 1.3  0.0 - 7.0 % Final  . BASO% 02/10/2017 0.8  0.0 - 2.0 % Final  . Sodium 02/10/2017 137  136 - 145 mEq/L Final  . Potassium 02/10/2017 3.7  3.5 - 5.1 mEq/L Final  . Chloride 02/10/2017 105  98 - 109 mEq/L Final  . CO2 02/10/2017 25  22 - 29 mEq/L Final  . Glucose 02/10/2017 113  70 - 140 mg/dl Final   Glucose reference range is for nonfasting patients. Fasting glucose reference range is 70- 100.  Marland Kitchen BUN 02/10/2017 14.0  7.0 - 26.0 mg/dL Final  . Creatinine 02/10/2017 0.8  0.6 - 1.1 mg/dL Final  . Total Bilirubin 02/10/2017 0.29  0.20 - 1.20 mg/dL Final  . Alkaline Phosphatase 02/10/2017 102  40 - 150 U/L Final  . AST 02/10/2017 21  5 - 34 U/L Final  . ALT 02/10/2017 29  0 - 55 U/L Final  . Total Protein 02/10/2017 7.6  6.4 - 8.3 g/dL Final  . Albumin 02/10/2017 3.6  3.5 - 5.0 g/dL Final  . Calcium 02/10/2017 9.6  8.4 - 10.4 mg/dL Final  . Anion Gap 02/10/2017 6  3 - 11 mEq/L Final  . EGFR 02/10/2017 >90  >90 ml/min/1.73 m2 Final   eGFR is calculated using the CKD-EPI Creatinine Equation (2009)       STUDIES: Ct Chest W Contrast  Result Date: 02/06/2017 CLINICAL DATA:  Breast cancer with suspected metastases EXAM:  CT CHEST WITH  CONTRAST TECHNIQUE: Multidetector CT imaging of the chest was performed during intravenous contrast administration. CONTRAST:  79m ISOVUE-300 IOPAMIDOL (ISOVUE-300) INJECTION 61% COMPARISON:  CTA chest dated 03/13/2007 FINDINGS: Cardiovascular: The heart is normal in size. No pericardial effusion. Right chest port terminates the cavoatrial junction. Mediastinum/Nodes: No suspicious mediastinal or hilar lymphadenopathy. Left axillary lymph nodes measuring up to 2.7 cm short axis (series 3/image 37). Left subpectoral nodes measuring up to 1.3 cm short axis (series 3/image 30). Lungs/Pleura:  No suspicious pulmonary nodules. No focal consolidation. No pleural effusion or pneumothorax. Upper Abdomen: Central gallstones (series 3/image 142). Otherwise unremarkable. Musculoskeletal: 4.6 x 4.9 cm mass in the medial left breast (series 3/image 46). Overlying skin thickening. Mild degenerative changes of the upper thoracic spine. IMPRESSION: 4.6 x 4.9 cm mass in the medial left breast, corresponding to known primary breast neoplasm. Overlying left breast skin thickening, likely reflecting post-treatment changes. Associated left axillary and subpectoral nodal metastases. Electronically Signed   By: SJulian HyM.D.   On: 02/06/2017 09:20   Mr BJeri CosWFYContrast  Result Date: 02/06/2017 CLINICAL DATA:  Breast cancer, persistent headaches. Assess for intracranial metastasis. EXAM: MRI HEAD WITHOUT AND WITH CONTRAST TECHNIQUE: Multiplanar, multiecho pulse sequences of the brain and surrounding structures were obtained without and with intravenous contrast. CONTRAST:  21mMULTIHANCE GADOBENATE DIMEGLUMINE 529 MG/ML IV SOLN COMPARISON:  None. FINDINGS: Motion degraded postcontrast imaging. INTRACRANIAL CONTENTS: No reduced diffusion to suggest acute ischemia or hypercellular tumor. No susceptibility artifact to suggest hemorrhage. Susceptibility artifact along the anterior falx compatible with dural  calcifications The ventricles and sulci are normal for patient's age. No suspicious parenchymal signal, masses, mass effect. No abnormal intraparenchymal or extra-axial enhancement. A few nonspecific punctate supratentorial white matter FLAIR T2 hyperintensities. No abnormal extra-axial fluid collections. No extra-axial masses. VASCULAR: Normal major intracranial vascular flow voids present at skull base. SKULL AND UPPER CERVICAL SPINE: No abnormal sellar expansion. No suspicious calvarial bone marrow signal. Craniocervical junction maintained. SINUSES/ORBITS: The mastoid air-cells and included paranasal sinuses are well-aerated.The included ocular globes and orbital contents are non-suspicious. OTHER: None. IMPRESSION: No intracranial metastasis; negative mildly motion degraded MRI of the head with and without contrast. Electronically Signed   By: CoElon Alas.D.   On: 02/06/2017 04:14   Mr Lumbar Spine W Wo Contrast  Result Date: 02/17/2017 CLINICAL DATA:  History of breast cancer. Evaluate for metastatic disease for staging. EXAM: MRI LUMBAR SPINE WITHOUT AND WITH CONTRAST TECHNIQUE: Multiplanar and multiecho pulse sequences of the lumbar spine were obtained without and with intravenous contrast. CONTRAST:  2034mULTIHANCE GADOBENATE DIMEGLUMINE 529 MG/ML IV SOLN COMPARISON:  Bone scan 02/05/2017 FINDINGS: Segmentation:  Standard. Alignment:  Physiologic. Vertebrae: No fracture, evidence of discitis, or aggressive bone lesion. 16 mm T1 and T2 hyperintense L4 vertebral body bone lesion most consistent with a hemangioma. Conus medullaris: Extends to the T12 level and appears normal. Paraspinal and other soft tissues: Negative. Disc levels: Disc spaces: Disc spaces are maintained. T12-L1: No significant disc bulge. No evidence of neural foraminal stenosis. No central canal stenosis. L1-L2: No significant disc bulge. No evidence of neural foraminal stenosis. No central canal stenosis. L2-L3: No significant  disc bulge. No evidence of neural foraminal stenosis. No central canal stenosis. L3-L4: No significant disc bulge. No evidence of neural foraminal stenosis. No central canal stenosis. Mild bilateral facet arthropathy. L4-L5: No significant disc bulge. No evidence of neural foraminal stenosis. No central canal stenosis. Moderate left and mild right facet arthropathy. L5-S1: No significant  disc bulge. No evidence of neural foraminal stenosis. No central canal stenosis. Mild bilateral facet arthropathy. IMPRESSION: 1. No abnormal bone lesions involving the lumbar spine to suggest metastatic disease. 2. Mild lumbar spine spondylosis. Electronically Signed   By: Kathreen Devoid   On: 02/17/2017 10:06   Nm Bone Scan Whole Body  Result Date: 02/05/2017 CLINICAL DATA:  LEFT breast cancer pre chemotherapy, diabetes mellitus EXAM: NUCLEAR MEDICINE WHOLE BODY BONE SCAN TECHNIQUE: Whole body anterior and posterior images were obtained approximately 3 hours after intravenous injection of radiopharmaceutical. RADIOPHARMACEUTICALS:  19.8 mCi Technetium-22mMDP IV COMPARISON:  None Radiographic correlation:  CT chest 02/05/2017 FINDINGS: Uptake in the shoulders, elbows, knees and feet typically degenerative. Uptake in the lumbar spine at approximately L2, nonspecific. Uptake in the lower lumbar spine bilaterally at L4 favor degenerative though metastatic disease not excluded. Questionable abnormal uptake at single anterior BILATERAL ribs. Questionable foci of abnormal increased tracer localization at the inferior LEFT scapula without CT correlate. No other definite sites of abnormal osseous tracer accumulation. Uptake of tracer in the breasts bilaterally which which is nonspecific, on the LEFT likely related to trabeculation and skin thickening as seen on CT. Expected urinary tract excretion of tracer. IMPRESSION: Scattered degenerative type uptake with additional nonspecific tracer localization at L2 vertebral body, BILATERAL  anterior ribs, and questionably the inferior LEFT scapula. Osseous metastases not excluded. Consider MR imaging the lumbar spine to exclude vertebral metastases. Electronically Signed   By: MLavonia DanaM.D.   On: 02/05/2017 19:23   Mr Breast Bilateral W Wo Contrast  Addendum Date: 01/22/2017   ADDENDUM REPORT: 01/22/2017 15:35 ADDENDUM: An addendum is made to the findings portion of the report due to a LEFT/ RIGHT type which should read as: Left breast: Extensive masslike and non masslike enhancement predominantly involving the upper inner LEFT breast and extending inferiorly to involve the entire central to lower left breast compatible with known malignancy. Biopsy clip artifact is present in the outer left breast at site of biopsied calcifications as well as inner left breast at site of biopsied mass. There is enlargement of the left breast as compared to the right with both skin and trabecular edema, however no abnormal enhancement of the skin or nipple-areola complex. There is no involvement of the pectoralis muscle. This mass cannot be discretely measured given the extensive nature of the disease, however spans approximately 12 cm in the AP dimension. Electronically Signed   By: JEverlean AlstromM.D.   On: 01/22/2017 15:35   Result Date: 01/22/2017 CLINICAL DATA:  46year old female with recently diagnosed left breast cancer after presenting for diagnostic mammography for palpable abnormality 01/09/2017. Extensive calcifications were noted throughout the left breast as well as a suspicious mass in the left breast at the 9:30 position. Stereotactic guided biopsy of the calcifications in the lower outer left breast demonstrated high grade ductal carcinoma in situ and ultrasound-guided biopsy of the mass in the left breast at the 9:30 position demonstrated invasive ductal carcinoma. A biopsy left axillary lymph node was positive for metastatic disease. LABS:  Not applicable. EXAM: BILATERAL BREAST MRI WITH  AND WITHOUT CONTRAST TECHNIQUE: Multiplanar, multisequence MR images of both breasts were obtained prior to and following the intravenous administration of 20 ml of MultiHance. THREE-DIMENSIONAL MR IMAGE RENDERING ON INDEPENDENT WORKSTATION: Three-dimensional MR images were rendered by post-processing of the original MR data on an independent workstation. The three-dimensional MR images were interpreted, and findings are reported in the following complete MRI report for this  study. Three dimensional images were evaluated at the independent DynaCad workstation COMPARISON:  Previous exam(s). FINDINGS: Breast composition: c.  Heterogeneous fibroglandular tissue. Background parenchymal enhancement: Moderate. Right breast: No suspicious rapidly enhancing masses or abnormal areas of enhancement in the right breast to suggest malignancy. Left breast: Extensive masslike and non masslike enhancement predominantly involving the upper inner right breast and extending inferiorly to involve the entire central to lower left breast compatible with known malignancy. Biopsy clip artifact is present in the outer left breast at site of biopsied calcifications as well as inner left breast at site of biopsied mass. There is enlargement of the left breast as compared to the right with both skin and trabecular edema, however no abnormal enhancement of the skin or nipple-areola complex. There is no involvement of the pectoralis muscle. This mass cannot be discretely measured given the extensive nature of the disease, however spans approximately 12 cm in the AP dimension. Lymph nodes: There is bulky left axillary lymphadenopathy, with the largest left axillary lymph node measuring up to 3.4 cm. Ancillary findings: Subcentimeter nonenhancing T2 bright lesions are seen in the liver which may represent small cysts. IMPRESSION: 1. Extensive malignancy throughout the left breast with bulky left axillary lymphadenopathy. 2.  No MRI evidence of  malignancy in the right breast. RECOMMENDATION: Treatment plan for known left breast malignancy. BI-RADS CATEGORY  6: Known biopsy-proven malignancy. Electronically Signed: By: Everlean Alstrom M.D. On: 01/22/2017 15:25   Dg Chest Port 1 View  Result Date: 01/28/2017 CLINICAL DATA:  Status post Port-A-Cath placement. EXAM: PORTABLE CHEST 1 VIEW COMPARISON:  03/13/2007. FINDINGS: 1551 hours. Low volume film. The cardio pericardial silhouette is enlarged. There is pulmonary vascular congestion without overt pulmonary edema. Component of underlying pulmonary edema cannot be excluded on this portable film. Right Port-A-Cath tip overlies the upper right atrium. No evidence for pneumothorax. Gas around the port device is compatible with the recent placement. IMPRESSION: Right Port-A-Cath tip overlies the upper right atrium. No evidence for pneumothorax. Cardiomegaly with vascular congestion. Interstitial edema not excluded. Electronically Signed   By: Misty Stanley M.D.   On: 01/28/2017 16:06   Dg Fluoro Guide Cv Line-no Report  Result Date: 01/28/2017 Fluoroscopy was utilized by the requesting physician.  No radiographic interpretation.    ELIGIBLE FOR AVAILABLE RESEARCH PROTOCOL: no  ASSESSMENT: 46 y.o. [place] woman status post left breast upper inner quadrant biopsy 01/14/2017 for a clinical T3 N2, stage IIA invasive ductal carcinoma, grade 1 or 2, estrogen and progesterone receptor positive, HER-2 nonamplified, with an MIB-1 of 20%.  (1) staging studies: Brain MRI, bone scan, and CT scan of the chest 02/05/2017 showed no brain lesions, no lung or liver lesions, a 4.9 cm mass in the left breast with left axillary and subpectoral adenopathy, and nonspecific bone scan tracer at L2, left scapula, and anterior ribs, with lumbar spine MRI suggested for further evaluation.  (2) neoadjuvant chemotherapy will consist of cyclophosphamide and doxorubicin in dose dense fashion 4 starting 02/10/2017, followed  by weekly paclitaxel 12  (3) definitive surgery to follow, most likely left modified radical mastectomy  (4) postmastectomy radiation is indicated  (5) adjuvant anti-estrogens to follow  (6) genetics testing pending (drawn 02/17/2017)  PLAN: Francely is doing good.  She did undergo MRI of the spine today that confirmed no lumbar metastases in the area of questionable uptake on the bone scan. I reviewed her MRI results with her today in detail.  Her labs were also stable today and she is  only very mildly neutropenic.  She and I reviewed this in detail as well.  She will meet with our genetic counselor today and have genetic testing drawn as well.  She does have very mild thrush.  I was going to send in Fluconazole, however, she would like to take Nystatin rinse instead.  We will try the Nystatin first.   Giada will return in one week for labs, follow up and cycle 2 of Doxorubicin and Cyclophosphamide.    She knows to call for any problems that may develop before that visit.  A total of (30) minutes of face-to-face time was spent with this patient with greater than 50% of that time in counseling and care-coordination.   Mackenzie Dock, NP   02/17/2017 11:20 AM Medical Oncology and Hematology [facility] [address] [place], [place] [address] Tel. [phone]    Fax. [phone]

## 2017-02-17 NOTE — Progress Notes (Signed)
REFERRING PROVIDER: Chauncey Cruel, MD [address] [place], [place] [address]  PRIMARY PROVIDER:  Willey Blade, MD  PRIMARY REASON FOR VISIT:  1. Malignant neoplasm of left breast in female, estrogen receptor positive, unspecified site of breast ([place])   2. Family history of breast cancer      HISTORY OF PRESENT ILLNESS:   Ms. Karen, a 46 y.o. female, was seen for a [facility] cancer genetics consultation at the request of Dr. Jana Hakim due to a personal and family history of cancer.  Ms. Clouatre presents to clinic today to discuss the possibility of a hereditary predisposition to cancer, genetic testing, and to further clarify her future cancer risks, as well as potential cancer risks for family members. Ms. Villers was accompanied to her appointment by her friend.  CANCER HISTORY: In August 2018, at the age of 68, Ms. 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 She recently began neoadjuvant chemotherapy. She plans mastectomy, radiation, and anti-estrogen.   HORMONAL RISK FACTORS:  Menarche was at age 37.  First live birth at age 52.  OCP use for approximately 3 years.  Ovaries intact: yes.  Hysterectomy: no.  Colonoscopy: no; not examined. Mammogram within the last year: yes.  Past Medical History:  Diagnosis Date  . Anemia   . Breast cancer ([place]) 01/14/2017   Left breast  . Diabetes mellitus    GDM    Past Surgical History:  Procedure Laterality Date  . CESAREAN SECTION     x2  . PORTACATH PLACEMENT Right 01/28/2017   Procedure: INSERTION PORT-A-CATH WITH ULTRASOUND;  Surgeon: Erroll Luna, MD;  Location: [facility];  Service: General;  Laterality: Right;  . TUBAL LIGATION      Social History   Social History  . Marital status: Married    Spouse name: N/A  . Number of children: N/A  . Years of education: N/A   Social History Main Topics  . Smoking status: Never Smoker  . Smokeless  tobacco: Never Used  . Alcohol use Yes     Comment: social  . Drug use: No  . Sexual activity: Not on file     Comment: BTL   Other Topics Concern  . Not on file   Social History Narrative  . No narrative on file     FAMILY HISTORY:  We obtained a detailed, 4-generation family history.  Significant diagnoses are listed below: Family History  Problem Relation Age of Onset  . Breast cancer Paternal Grandmother 50       d.60s from breast cancer. Did not have treatment.  . Other Mother        K.44 from complications of surgery to remove brain tumor   Ms. Varney has four daughters, ages 58, 29, 18, and 107. She has one full brother, age 71, who is without cancers.   Ms. Escajeda mother died at age 69 from complications of a surgery to remove a reportedly benign brain tumor. Ms. Caroll mother had one full sister, age 74, who is without cancers. Ms. Glade mother also had a maternal half-sister, two paternal half-sisters, and four paternal half-brothers. None of her mother's siblings are known to have had cancers. Ms. Hickam maternal grandparents both died in their 62s without cancers.  Ms. Shadix father is 50 without cancers. Ms. Sabet has a paternal half-sister, but does not have much information about her. Ms. Amend has five paternal uncles. Though two are deceased, all of her  uncles lived to at least 41 without cancers. Ms. Elsberry paternal grandmother was diagnosed with breast cancer in her 80s, did not undergo treatment, and died from the disease shortly after being diagnosed. Ms. Opperman paternal grandfather died before she was born of unknown cause.  Ms. Dimon is unaware of previous family history of genetic testing for hereditary cancer risks. Patient's maternal ancestors are of [place] American and Native American descent, and paternal ancestors are of African American descent. There is no reported Ashkenazi Jewish ancestry. There is no known  consanguinity.  GENETIC COUNSELING ASSESSMENT: Darrin C Dowers is a 46 y.o. female with a personal history of breast cancer prior to age 31 and a family history of breast cancer in a close relative. This is history is somewhat suggestive of a hereditary cancer syndrome and predisposition to cancer. We, therefore, discussed and recommended the following at today's visit.   DISCUSSION: We reviewed the characteristics, features and inheritance patterns of hereditary cancer syndromes. We also discussed genetic testing, including the appropriate family members to test, the process of testing, insurance coverage and turn-around-time for results. We discussed the implications of a negative, positive and/or variant of uncertain significant result. We recommended Ms. Tabares pursue genetic testing for the Common Hereditary Cancer Panel offered by [facility]. [facility]'s Common Hereditary Cancers Panel includes analysis of the following 46 genes: APC, ATM, AXIN2, BARD1, BMPR1A, BRCA1, BRCA2, BRIP1, CDH1, CDKN2A, CHEK2, CTNNA1, DICER1, EPCAM, GREM1, HOXB13, KIT, MEN1, MLH1, MSH2, MSH3, MSH6, MUTYH, NBN, NF1, NTHL1, PALB2, PDGFRA, PMS2, POLD1, POLE, PTEN, RAD50, RAD51C, RAD51D, SDHA, SDHB, SDHC, SDHD, SMAD4, SMARCA4, STK11, TP53, TSC1, TSC2, and VHL.   Based on Ms. Pfannenstiel's personal and family history of cancer, she meets medical criteria for genetic testing. Despite that she meets criteria, she may still have an out of pocket cost. We discussed that if her out of pocket cost for testing is over $100, the laboratory will call and confirm whether she wants to proceed with testing.  If the out of pocket cost of testing is less than $100 she will be billed by the genetic testing laboratory.   PLAN: After considering the risks, benefits, and limitations, Ms. Hucker  provided informed consent to pursue genetic testing and the blood sample was sent to [facility] for analysis of the 46-gene Common Hereditary Cancer  Panel. Results should be available within approximately 3 weeks' time, at which point they will be disclosed by telephone to Ms. Mountjoy, as will any additional recommendations warranted by these results. This information will also be available in Epic.   Lastly, we encouraged Ms. Skoog to remain in contact with cancer genetics annually so that we can continuously update the family history and inform her of any changes in cancer genetics and testing that may be of benefit for this family.   Ms.  Driscoll questions were answered to her satisfaction today. Our contact information was provided should additional questions or concerns arise. Thank you for the referral and allowing [name] to share in the care of your patient.   Mal Misty, MS, [facility] Certified Naval [email or web address] phone: [phone]  The patient was seen for a total of 35 minutes in face-to-face genetic counseling.  This patient was discussed with Drs. Magrinat, Lindi Adie and/or Burr Medico who agrees with the above.    _______________________________________________________________________ For Office Staff:  Number of people involved in session: 2 Was an Intern/ student involved with case: yes

## 2017-02-18 ENCOUNTER — Telehealth: Payer: Self-pay | Admitting: Adult Health

## 2017-02-18 DIAGNOSIS — H5213 Myopia, bilateral: Secondary | ICD-10-CM | POA: Diagnosis not present

## 2017-02-18 NOTE — Telephone Encounter (Signed)
Per 9/11 no los °

## 2017-02-24 ENCOUNTER — Encounter: Payer: Self-pay | Admitting: *Deleted

## 2017-02-24 ENCOUNTER — Ambulatory Visit (HOSPITAL_BASED_OUTPATIENT_CLINIC_OR_DEPARTMENT_OTHER): Payer: 59

## 2017-02-24 ENCOUNTER — Encounter: Payer: Self-pay | Admitting: Adult Health

## 2017-02-24 ENCOUNTER — Other Ambulatory Visit (HOSPITAL_BASED_OUTPATIENT_CLINIC_OR_DEPARTMENT_OTHER): Payer: 59

## 2017-02-24 ENCOUNTER — Ambulatory Visit: Payer: 59

## 2017-02-24 ENCOUNTER — Ambulatory Visit (HOSPITAL_BASED_OUTPATIENT_CLINIC_OR_DEPARTMENT_OTHER): Payer: 59 | Admitting: Adult Health

## 2017-02-24 ENCOUNTER — Other Ambulatory Visit: Payer: 59

## 2017-02-24 ENCOUNTER — Telehealth: Payer: Self-pay | Admitting: Adult Health

## 2017-02-24 VITALS — BP 147/67 | HR 90 | Temp 97.6°F | Resp 16 | Ht 66.0 in | Wt 282.9 lb

## 2017-02-24 DIAGNOSIS — C50812 Malignant neoplasm of overlapping sites of left female breast: Secondary | ICD-10-CM

## 2017-02-24 DIAGNOSIS — Z170 Estrogen receptor positive status [ER+]: Principal | ICD-10-CM

## 2017-02-24 DIAGNOSIS — Z5111 Encounter for antineoplastic chemotherapy: Secondary | ICD-10-CM

## 2017-02-24 DIAGNOSIS — C50912 Malignant neoplasm of unspecified site of left female breast: Secondary | ICD-10-CM

## 2017-02-24 DIAGNOSIS — C773 Secondary and unspecified malignant neoplasm of axilla and upper limb lymph nodes: Secondary | ICD-10-CM

## 2017-02-24 LAB — COMPREHENSIVE METABOLIC PANEL
ALBUMIN: 3.5 g/dL (ref 3.5–5.0)
ALK PHOS: 121 U/L (ref 40–150)
ALT: 22 U/L (ref 0–55)
ANION GAP: 9 meq/L (ref 3–11)
AST: 15 U/L (ref 5–34)
BUN: 14.1 mg/dL (ref 7.0–26.0)
CO2: 25 mEq/L (ref 22–29)
Calcium: 9.3 mg/dL (ref 8.4–10.4)
Chloride: 105 mEq/L (ref 98–109)
Creatinine: 0.8 mg/dL (ref 0.6–1.1)
Glucose: 138 mg/dl (ref 70–140)
Potassium: 3.8 mEq/L (ref 3.5–5.1)
Sodium: 138 mEq/L (ref 136–145)
TOTAL PROTEIN: 6.9 g/dL (ref 6.4–8.3)

## 2017-02-24 LAB — CBC WITH DIFFERENTIAL/PLATELET
BASO%: 0.3 % (ref 0.0–2.0)
BASOS ABS: 0 10*3/uL (ref 0.0–0.1)
EOS%: 0.2 % (ref 0.0–7.0)
Eosinophils Absolute: 0 10*3/uL (ref 0.0–0.5)
HEMATOCRIT: 32.5 % — AB (ref 34.8–46.6)
HGB: 10.4 g/dL — ABNORMAL LOW (ref 11.6–15.9)
LYMPH%: 17.4 % (ref 14.0–49.7)
MCH: 26 pg (ref 25.1–34.0)
MCHC: 32 g/dL (ref 31.5–36.0)
MCV: 81.3 fL (ref 79.5–101.0)
MONO#: 0.8 10*3/uL (ref 0.1–0.9)
MONO%: 12.5 % (ref 0.0–14.0)
NEUT#: 4.3 10*3/uL (ref 1.5–6.5)
NEUT%: 69.6 % (ref 38.4–76.8)
Platelets: 228 10*3/uL (ref 145–400)
RBC: 4 10*6/uL (ref 3.70–5.45)
RDW: 15.5 % — ABNORMAL HIGH (ref 11.2–14.5)
WBC: 6.2 10*3/uL (ref 3.9–10.3)
lymph#: 1.1 10*3/uL (ref 0.9–3.3)

## 2017-02-24 MED ORDER — SODIUM CHLORIDE 0.9% FLUSH
10.0000 mL | INTRAVENOUS | Status: DC | PRN
  Administered 2017-02-24: 10 mL
  Filled 2017-02-24: qty 10

## 2017-02-24 MED ORDER — CYCLOPHOSPHAMIDE CHEMO INJECTION 1 GM
600.0000 mg/m2 | Freq: Once | INTRAMUSCULAR | Status: AC
  Administered 2017-02-24: 1460 mg via INTRAVENOUS
  Filled 2017-02-24: qty 73

## 2017-02-24 MED ORDER — PEGFILGRASTIM 6 MG/0.6ML ~~LOC~~ PSKT
6.0000 mg | PREFILLED_SYRINGE | Freq: Once | SUBCUTANEOUS | Status: AC
  Administered 2017-02-24: 6 mg via SUBCUTANEOUS
  Filled 2017-02-24: qty 0.6

## 2017-02-24 MED ORDER — HEPARIN SOD (PORK) LOCK FLUSH 100 UNIT/ML IV SOLN
500.0000 [IU] | Freq: Once | INTRAVENOUS | Status: AC | PRN
  Administered 2017-02-24: 500 [IU]
  Filled 2017-02-24: qty 5

## 2017-02-24 MED ORDER — SODIUM CHLORIDE 0.9 % IV SOLN
Freq: Once | INTRAVENOUS | Status: AC
  Administered 2017-02-24: 10:00:00 via INTRAVENOUS
  Filled 2017-02-24: qty 5

## 2017-02-24 MED ORDER — PALONOSETRON HCL INJECTION 0.25 MG/5ML
0.2500 mg | Freq: Once | INTRAVENOUS | Status: AC
  Administered 2017-02-24: 0.25 mg via INTRAVENOUS

## 2017-02-24 MED ORDER — PALONOSETRON HCL INJECTION 0.25 MG/5ML
INTRAVENOUS | Status: AC
  Filled 2017-02-24: qty 5

## 2017-02-24 MED ORDER — SODIUM CHLORIDE 0.9 % IV SOLN
Freq: Once | INTRAVENOUS | Status: AC
  Administered 2017-02-24: 10:00:00 via INTRAVENOUS

## 2017-02-24 MED ORDER — DOXORUBICIN HCL CHEMO IV INJECTION 2 MG/ML
60.0000 mg/m2 | Freq: Once | INTRAVENOUS | Status: AC
  Administered 2017-02-24: 146 mg via INTRAVENOUS
  Filled 2017-02-24: qty 73

## 2017-02-24 NOTE — Progress Notes (Signed)
Towards the end of pt's cytoxan infusion, pt reported feeling "burning in my nose". Infusion rate slowed down per protocol. Pharmacy notified.

## 2017-02-24 NOTE — Patient Instructions (Signed)
[facility] Discharge Instructions for Patients Receiving Chemotherapy  Today you received the following chemotherapy agents:Adriamycin and Cytoxan   To help prevent nausea and vomiting after your treatment, we encourage you to take your nausea medication as directed.    If you develop nausea and vomiting that is not controlled by your nausea medication, call the clinic.   BELOW ARE SYMPTOMS THAT SHOULD BE REPORTED IMMEDIATELY:  *FEVER GREATER THAN 100.5 F  *CHILLS WITH OR WITHOUT FEVER  NAUSEA AND VOMITING THAT IS NOT CONTROLLED WITH YOUR NAUSEA MEDICATION  *UNUSUAL SHORTNESS OF BREATH  *UNUSUAL BRUISING OR BLEEDING  TENDERNESS IN MOUTH AND THROAT WITH OR WITHOUT PRESENCE OF ULCERS  *URINARY PROBLEMS  *BOWEL PROBLEMS  UNUSUAL RASH Items with * indicate a potential emergency and should be followed up as soon as possible.  Feel free to call the clinic you have any questions or concerns. The clinic phone number is [phone].  Please show the CHEMO ALERT CARD at check-in to the Emergency Department and triage nurse.   

## 2017-02-24 NOTE — Progress Notes (Signed)
Mackenzie [name]  Telephone:([phone]) [phone] Fax:(336) [phone]     ID: Mackenzie [name] DOB: 08/17/1970  MR#: [id number]  IWP#:[id number]  Patient Care Team: Willey Blade, MD as PCP - General (Internal Medicine) Magrinat, [name], MD as Consulting Physician (Oncology) Erroll Luna, MD as Consulting Physician (General Surgery) Kyung Rudd, MD as Consulting Physician (Radiation Oncology) Scot Dock, NP OTHER MD:  CHIEF COMPLAINT: Estrogen receptor positive breast cancer  CURRENT TREATMENT: Neoadjuvant chemotherapy pending   HISTORY OF CURRENT ILLNESS: From the original intake note:  The patient herself noted some changes in her left breast late July 2018, she says, and eventually brought this to medical attention so that on 01/09/2017 she underwent bilateral diagnostic mammography with tomography and left breast ultrasonography at the breast Center. This found the breast density to be category C. In the upper inner quadrant of the left breast there was an area of asymmetry and there were malignant type calcifications involving all 4 quadrants. On exam there is firmness and palpable thickening in the anterior left breast with skin dimpling. Ultrasonography found at the 9:30 o'clock radiant 3 cm from the nipple a 2.7 cm mass and in the left axilla for abnormal lymph nodes largest of which measured 2.7 cm.  Biopsy of the left breast 9:30 o'clock mass 80 01/26/2017 showed ([id number]) invasive ductal carcinoma, with extracellular mucin, grade 1 or 2. In the lower outer left breast is separated biopsy the same day showed ductal carcinoma in situ. One of the 4 lymph nodes involved was positive for metastatic carcinoma. Prognostic panel on the invasive disease showed it to be estrogen receptor 100% positive, progesterone receptor 20% positive, both with strong staining intensity, with an MIB-1 of 20%, and no HER-2 amplification with a signals ratio 1.38 and the number per cell  2.90.  On 01/22/2017 the patient underwent bilateral breast MRI. This showed no involvement of the right breast, but in the left breast there was a masslike and non-masslike enhancement involving all quadrants, with skin swelling but no abnormal enhancement of the skin or nipple areolar complex or pectoralis muscle. The mass could not be clearly measured but spanned approximately 12 cm. There was bulky left axillary lymphadenopathy, with the largest lymph node measuring up to 3.4 cm.  The patient's subsequent history is as detailed below.  INTERVAL HISTORY: Mackenzie [name] is here today for evaluation prior to receiving her second cycle of Doxorubicin and Cyclophosphamide with neulasta support.  She is accompanied by her husband and son today.  She receives Doxorubicin and Cyclophosphamide every two weeks.  She is doing well today.  Her throat is slightly sore, and she has had an increase in malodorous gas, but she is otherwise doing well and is without any questions or concerns.  She denies fever, chills, nausea, vomiting, constipation, diarrhea, abdominal pain, reflux, bloating, melena, hematochezia, or  mucositis.    REVIEW OF SYSTEMS: A detailed review of systems today was otherwise stable  PAST MEDICAL HISTORY: Past Medical History:  Diagnosis Date  . Anemia   . Breast cancer ([place]) 01/14/2017   Left breast  . Diabetes mellitus    GDM    PAST SURGICAL HISTORY: Past Surgical History:  Procedure Laterality Date  . CESAREAN SECTION     x2  . PORTACATH PLACEMENT Right 01/28/2017   Procedure: INSERTION PORT-A-CATH WITH ULTRASOUND;  Surgeon: Erroll Luna, MD;  Location: [facility];  Service: General;  Laterality: Right;  . TUBAL LIGATION      FAMILY  HISTORY Family History  Problem Relation Age of Onset  . Breast cancer Paternal Grandmother 17       d.60s from breast cancer. Did not have treatment.  . Other Mother        U.72 from complications of surgery to remove brain  tumor  The patient's father still alive at age 75. The patient's mother died with a brain tumor which the patient says was "benign". She was 56. The patient has one brother, no sisters. The only breast cancer in the family is a paternal grandmother who died from breast cancer at an unknown age. There is no history of ovarian or prostate cancer in the family.  GYNECOLOGIC HISTORY:  Patient's last menstrual period was 02/10/2017. Menarche age 62 and first live birth age 39 she is [place] P4. The person is still having regular periods as of August 2018  SOCIAL HISTORY: (As of August 2018) Tasneem works at [employer] in the fifth floor [place]. Her husband [name] is a Building control surveyor. Daughter and married and lives in [place] and works as a Education administrator. Daughter [name] lives in [place] and works in the police department. Daughters Sonora and Joshua Tree are 18 and 6, living at home. The patient has no grandchildren. She attends a Tour manager    ADVANCED DIRECTIVES: Not in place   HEALTH MAINTENANCE: Social History  Substance Use Topics  . Smoking status: Never Smoker  . Smokeless tobacco: Never Used  . Alcohol use Yes     Comment: social     Colonoscopy: Never  PAP: December 2017  Bone density: Never   No Known Allergies  Current Outpatient Prescriptions  Medication Sig Dispense Refill  . benzonatate (TESSALON) 100 MG capsule Take 100 mg by mouth as needed. For cough    . cetirizine (ZYRTEC) 10 MG tablet Take 10 mg by mouth daily.      Marland Kitchen dexamethasone (DECADRON) 4 MG tablet Take 2 tablets by mouth once a day on the day after chemotherapy and then take 2 tablets two times a day for 2 days. Take with food. 30 tablet 1  . HYDROcodone-homatropine (HYCODAN) 5-1.5 MG/5ML syrup Take 5 mLs by mouth 3 (three) times daily as needed.    . lidocaine-prilocaine (EMLA) cream Apply to affected area once 30 g 3  . loratadine (CLARITIN) 10 MG tablet Take 1 tablet (10 mg total) by mouth daily.  90 tablet 2  . LORazepam (ATIVAN) 0.5 MG tablet Take 1 tablet (0.5 mg total) by mouth at bedtime as needed (Nausea or vomiting). 30 tablet 0  . montelukast (SINGULAIR) 10 MG tablet Take 10 mg by mouth at bedtime. prn    . Multiple Vitamin (MULTIVITAMIN) tablet Take 1 tablet by mouth daily.    Marland Kitchen nystatin (MYCOSTATIN) 100000 UNIT/ML suspension Take 5 mLs (500,000 Units total) by mouth 4 (four) times daily. 60 mL 0  . Omega-3 Fatty Acids (FISH OIL) 1000 MG CAPS Take 1,000 mg by mouth 2 (two) times daily.     Marland Kitchen oxyCODONE (OXY IR/ROXICODONE) 5 MG immediate release tablet Take 1-2 tablets (5-10 mg total) by mouth every 6 (six) hours as needed for severe pain. 20 tablet 0  . prochlorperazine (COMPAZINE) 10 MG tablet Take 1 tablet (10 mg total) by mouth every 6 (six) hours as needed (Nausea or vomiting). 30 tablet 1   No current facility-administered medications for this visit.     OBJECTIVE:   Vitals:   02/24/17 0910  BP: (!) 147/67  Pulse: 90  Resp:  16  Temp: 97.6 F (36.4 C)  SpO2: 100%     Body mass index is 45.66 kg/m.   Wt Readings from Last 3 Encounters:  02/24/17 282 lb 14.4 oz (128.3 kg)  02/17/17 278 lb 6.4 oz (126.3 kg)  02/06/17 278 lb 8 oz (126.3 kg)      ECOG FS:1 - Symptomatic but completely ambulatory GENERAL: Patient is a well appearing female in no acute distress HEENT:  Sclerae anicteric.  Oropharynx clear and moist. No ulcerations or evidence of oropharyngeal candidiasis. Neck is supple.  NODES:  No cervical, supraclavicular, or axillary lymphadenopathy palpated.  BREAST EXAM:  Deferred. LUNGS:  Clear to auscultation bilaterally.  No wheezes or rhonchi. HEART:  Regular rate and rhythm. No murmur appreciated. ABDOMEN:  Soft, nontender.  Positive, normoactive bowel sounds. No organomegaly palpated. MSK:  No focal spinal tenderness to palpation. Full range of motion bilaterally in the upper extremities. EXTREMITIES:  No peripheral edema.   SKIN:  Clear with no obvious  rashes or skin changes. No nail dyscrasia. NEURO:  Nonfocal. Well oriented.  Appropriate affect.     LAB RESULTS:    Appointment on 02/24/2017  Component Date Value Ref Range Status  . WBC 02/24/2017 6.2  3.9 - 10.3 10e3/uL Final  . NEUT# 02/24/2017 4.3  1.5 - 6.5 10e3/uL Final  . HGB 02/24/2017 10.4* 11.6 - 15.9 g/dL Final  . HCT 02/24/2017 32.5* 34.8 - 46.6 % Final  . Platelets 02/24/2017 228  145 - 400 10e3/uL Final  . MCV 02/24/2017 81.3  79.5 - 101.0 fL Final  . MCH 02/24/2017 26.0  25.1 - 34.0 pg Final  . MCHC 02/24/2017 32.0  31.5 - 36.0 g/dL Final  . RBC 02/24/2017 4.00  3.70 - 5.45 10e6/uL Final  . RDW 02/24/2017 15.5* 11.2 - 14.5 % Final  . lymph# 02/24/2017 1.1  0.9 - 3.3 10e3/uL Final  . MONO# 02/24/2017 0.8  0.1 - 0.9 10e3/uL Final  . Eosinophils Absolute 02/24/2017 0.0  0.0 - 0.5 10e3/uL Final  . Basophils Absolute 02/24/2017 0.0  0.0 - 0.1 10e3/uL Final  . NEUT% 02/24/2017 69.6  38.4 - 76.8 % Final  . LYMPH% 02/24/2017 17.4  14.0 - 49.7 % Final  . MONO% 02/24/2017 12.5  0.0 - 14.0 % Final  . EOS% 02/24/2017 0.2  0.0 - 7.0 % Final  . BASO% 02/24/2017 0.3  0.0 - 2.0 % Final       STUDIES: Ct Chest W Contrast  Result Date: 02/06/2017 CLINICAL DATA:  Breast cancer with suspected metastases EXAM: CT CHEST WITH CONTRAST TECHNIQUE: Multidetector CT imaging of the chest was performed during intravenous contrast administration. CONTRAST:  64m ISOVUE-300 IOPAMIDOL (ISOVUE-300) INJECTION 61% COMPARISON:  CTA chest dated 03/13/2007 FINDINGS: Cardiovascular: The heart is normal in size. No pericardial effusion. Right chest port terminates the cavoatrial junction. Mediastinum/Nodes: No suspicious mediastinal or hilar lymphadenopathy. Left axillary lymph nodes measuring up to 2.7 cm short axis (series 3/image 37). Left subpectoral nodes measuring up to 1.3 cm short axis (series 3/image 30). Lungs/Pleura:  No suspicious pulmonary nodules. No focal consolidation. No pleural  effusion or pneumothorax. Upper Abdomen: Central gallstones (series 3/image 142). Otherwise unremarkable. Musculoskeletal: 4.6 x 4.9 cm mass in the medial left breast (series 3/image 46). Overlying skin thickening. Mild degenerative changes of the upper thoracic spine. IMPRESSION: 4.6 x 4.9 cm mass in the medial left breast, corresponding to known primary breast neoplasm. Overlying left breast skin thickening, likely reflecting post-treatment changes. Associated left axillary and  subpectoral nodal metastases. Electronically Signed   By: Julian Hy M.D.   On: 02/06/2017 09:20   Mr Jeri Cos MO Contrast  Result Date: 02/06/2017 CLINICAL DATA:  Breast cancer, persistent headaches. Assess for intracranial metastasis. EXAM: MRI HEAD WITHOUT AND WITH CONTRAST TECHNIQUE: Multiplanar, multiecho pulse sequences of the brain and surrounding structures were obtained without and with intravenous contrast. CONTRAST:  21m MULTIHANCE GADOBENATE DIMEGLUMINE 529 MG/ML IV SOLN COMPARISON:  None. FINDINGS: Motion degraded postcontrast imaging. INTRACRANIAL CONTENTS: No reduced diffusion to suggest acute ischemia or hypercellular tumor. No susceptibility artifact to suggest hemorrhage. Susceptibility artifact along the anterior falx compatible with dural calcifications The ventricles and sulci are normal for patient's age. No suspicious parenchymal signal, masses, mass effect. No abnormal intraparenchymal or extra-axial enhancement. A few nonspecific punctate supratentorial white matter FLAIR T2 hyperintensities. No abnormal extra-axial fluid collections. No extra-axial masses. VASCULAR: Normal major intracranial vascular flow voids present at skull base. SKULL AND UPPER CERVICAL SPINE: No abnormal sellar expansion. No suspicious calvarial bone marrow signal. Craniocervical junction maintained. SINUSES/ORBITS: The mastoid air-cells and included paranasal sinuses are well-aerated.The included ocular globes and orbital contents  are non-suspicious. OTHER: None. IMPRESSION: No intracranial metastasis; negative mildly motion degraded MRI of the head with and without contrast. Electronically Signed   By: CElon AlasM.D.   On: 02/06/2017 04:14   Mr Lumbar Spine W Wo Contrast  Result Date: 02/17/2017 CLINICAL DATA:  History of breast cancer. Evaluate for metastatic disease for staging. EXAM: MRI LUMBAR SPINE WITHOUT AND WITH CONTRAST TECHNIQUE: Multiplanar and multiecho pulse sequences of the lumbar spine were obtained without and with intravenous contrast. CONTRAST:  234mMULTIHANCE GADOBENATE DIMEGLUMINE 529 MG/ML IV SOLN COMPARISON:  Bone scan 02/05/2017 FINDINGS: Segmentation:  Standard. Alignment:  Physiologic. Vertebrae: No fracture, evidence of discitis, or aggressive bone lesion. 16 mm T1 and T2 hyperintense L4 vertebral body bone lesion most consistent with a hemangioma. Conus medullaris: Extends to the T12 level and appears normal. Paraspinal and other soft tissues: Negative. Disc levels: Disc spaces: Disc spaces are maintained. T12-L1: No significant disc bulge. No evidence of neural foraminal stenosis. No central canal stenosis. L1-L2: No significant disc bulge. No evidence of neural foraminal stenosis. No central canal stenosis. L2-L3: No significant disc bulge. No evidence of neural foraminal stenosis. No central canal stenosis. L3-L4: No significant disc bulge. No evidence of neural foraminal stenosis. No central canal stenosis. Mild bilateral facet arthropathy. L4-L5: No significant disc bulge. No evidence of neural foraminal stenosis. No central canal stenosis. Moderate left and mild right facet arthropathy. L5-S1: No significant disc bulge. No evidence of neural foraminal stenosis. No central canal stenosis. Mild bilateral facet arthropathy. IMPRESSION: 1. No abnormal bone lesions involving the lumbar spine to suggest metastatic disease. 2. Mild lumbar spine spondylosis. Electronically Signed   By: HeKathreen Devoid On:  02/17/2017 10:06   Nm Bone Scan Whole Body  Result Date: 02/05/2017 CLINICAL DATA:  LEFT breast cancer pre chemotherapy, diabetes mellitus EXAM: NUCLEAR MEDICINE WHOLE BODY BONE SCAN TECHNIQUE: Whole body anterior and posterior images were obtained approximately 3 hours after intravenous injection of radiopharmaceutical. RADIOPHARMACEUTICALS:  19.8 mCi Technetium-9933mP IV COMPARISON:  None Radiographic correlation:  CT chest 02/05/2017 FINDINGS: Uptake in the shoulders, elbows, knees and feet typically degenerative. Uptake in the lumbar spine at approximately L2, nonspecific. Uptake in the lower lumbar spine bilaterally at L4 favor degenerative though metastatic disease not excluded. Questionable abnormal uptake at single anterior BILATERAL ribs. Questionable foci of abnormal increased  tracer localization at the inferior LEFT scapula without CT correlate. No other definite sites of abnormal osseous tracer accumulation. Uptake of tracer in the breasts bilaterally which which is nonspecific, on the LEFT likely related to trabeculation and skin thickening as seen on CT. Expected urinary tract excretion of tracer. IMPRESSION: Scattered degenerative type uptake with additional nonspecific tracer localization at L2 vertebral body, BILATERAL anterior ribs, and questionably the inferior LEFT scapula. Osseous metastases not excluded. Consider MR imaging the lumbar spine to exclude vertebral metastases. Electronically Signed   By: Lavonia Dana M.D.   On: 02/05/2017 19:23   Dg Chest Port 1 View  Result Date: 01/28/2017 CLINICAL DATA:  Status post Port-A-Cath placement. EXAM: PORTABLE CHEST 1 VIEW COMPARISON:  03/13/2007. FINDINGS: 1551 hours. Low volume film. The cardio pericardial silhouette is enlarged. There is pulmonary vascular congestion without overt pulmonary edema. Component of underlying pulmonary edema cannot be excluded on this portable film. Right Port-A-Cath tip overlies the upper right atrium. No  evidence for pneumothorax. Gas around the port device is compatible with the recent placement. IMPRESSION: Right Port-A-Cath tip overlies the upper right atrium. No evidence for pneumothorax. Cardiomegaly with vascular congestion. Interstitial edema not excluded. Electronically Signed   By: Misty Stanley M.D.   On: 01/28/2017 16:06   Dg Fluoro Guide Cv Line-no Report  Result Date: 01/28/2017 Fluoroscopy was utilized by the requesting physician.  No radiographic interpretation.    ELIGIBLE FOR AVAILABLE RESEARCH PROTOCOL: no  ASSESSMENT: 46 y.o.  woman status post left breast upper inner quadrant biopsy 01/14/2017 for a clinical T3 N2, stage IIA invasive ductal carcinoma, grade 1 or 2, estrogen and progesterone receptor positive, HER-2 nonamplified, with an MIB-1 of 20%.  (1) staging studies: Brain MRI, bone scan, and CT scan of the chest 02/05/2017 showed no brain lesions, no lung or liver lesions, a 4.9 cm mass in the left breast with left axillary and subpectoral adenopathy, and nonspecific bone scan tracer at L2, left scapula, and anterior ribs, with lumbar spine MRI suggested for further evaluation.  (2) neoadjuvant chemotherapy will consist of cyclophosphamide and doxorubicin in dose dense fashion 4 starting 02/10/2017, followed by weekly paclitaxel 12  (3) definitive surgery to follow, most likely left modified radical mastectomy  (4) postmastectomy radiation is indicated  (5) adjuvant anti-estrogens to follow  (6) genetics testing pending (drawn 02/17/2017)  PLAN: Keyleigh is doing well today.  She will proceed with cycle two of Doxorubicin and Cyclophosphamide today.  She will receive Neulasta in the form of Onpro.  She knows how to take her anti-emetics and Claritin following treatment.  Her ENT exam is normal today, along with her labs, and her abdominal exam.  We will continue to monitor her sore throat/gas issues and she knows to call us for any changes.    [place]  will return in one week for labs, and follow up.    She knows to call for any problems that may develop before that visit.  A total of (20) minutes of face-to-face time was spent with this patient with greater than 50% of that time in counseling and care-coordination.   Scot Dock, NP   02/24/2017 9:11 AM Medical Oncology and Hematology [facility] [address] [place], [place] [address] Tel. [phone]    Fax. [phone]

## 2017-02-24 NOTE — Telephone Encounter (Signed)
No 9/18 los -  

## 2017-03-02 ENCOUNTER — Encounter: Payer: Self-pay | Admitting: Adult Health

## 2017-03-02 NOTE — Progress Notes (Signed)
Received voicemail from patient regarding financial assistance.  Returned patient's call at number left on voicemail. No answer, left message with my contact information.

## 2017-03-03 ENCOUNTER — Encounter: Payer: Self-pay | Admitting: Adult Health

## 2017-03-03 ENCOUNTER — Ambulatory Visit (HOSPITAL_BASED_OUTPATIENT_CLINIC_OR_DEPARTMENT_OTHER): Payer: 59 | Admitting: Adult Health

## 2017-03-03 ENCOUNTER — Other Ambulatory Visit (HOSPITAL_BASED_OUTPATIENT_CLINIC_OR_DEPARTMENT_OTHER): Payer: 59

## 2017-03-03 VITALS — BP 147/78 | HR 103 | Temp 98.4°F | Resp 24 | Ht 66.0 in | Wt 281.4 lb

## 2017-03-03 DIAGNOSIS — G629 Polyneuropathy, unspecified: Secondary | ICD-10-CM | POA: Diagnosis not present

## 2017-03-03 DIAGNOSIS — Z170 Estrogen receptor positive status [ER+]: Principal | ICD-10-CM

## 2017-03-03 DIAGNOSIS — C50812 Malignant neoplasm of overlapping sites of left female breast: Secondary | ICD-10-CM | POA: Diagnosis not present

## 2017-03-03 LAB — CBC WITH DIFFERENTIAL/PLATELET
BASO%: 1 % (ref 0.0–2.0)
BASOS ABS: 0 10*3/uL (ref 0.0–0.1)
EOS ABS: 0 10*3/uL (ref 0.0–0.5)
EOS%: 0.2 % (ref 0.0–7.0)
HEMATOCRIT: 31.6 % — AB (ref 34.8–46.6)
HEMOGLOBIN: 10.3 g/dL — AB (ref 11.6–15.9)
LYMPH%: 24.2 % (ref 14.0–49.7)
MCH: 26.1 pg (ref 25.1–34.0)
MCHC: 32.7 g/dL (ref 31.5–36.0)
MCV: 79.8 fL (ref 79.5–101.0)
MONO#: 0.4 10*3/uL (ref 0.1–0.9)
MONO%: 10.3 % (ref 0.0–14.0)
NEUT%: 64.3 % (ref 38.4–76.8)
NEUTROS ABS: 2.5 10*3/uL (ref 1.5–6.5)
PLATELETS: 255 10*3/uL (ref 145–400)
RBC: 3.95 10*6/uL (ref 3.70–5.45)
RDW: 16.4 % — AB (ref 11.2–14.5)
WBC: 3.8 10*3/uL — AB (ref 3.9–10.3)
lymph#: 0.9 10*3/uL (ref 0.9–3.3)

## 2017-03-03 LAB — COMPREHENSIVE METABOLIC PANEL
ALBUMIN: 3.6 g/dL (ref 3.5–5.0)
ALK PHOS: 144 U/L (ref 40–150)
ALT: 17 U/L (ref 0–55)
ANION GAP: 7 meq/L (ref 3–11)
AST: 13 U/L (ref 5–34)
BUN: 16.5 mg/dL (ref 7.0–26.0)
CALCIUM: 9.4 mg/dL (ref 8.4–10.4)
CO2: 27 mEq/L (ref 22–29)
Chloride: 103 mEq/L (ref 98–109)
Creatinine: 0.8 mg/dL (ref 0.6–1.1)
GLUCOSE: 102 mg/dL (ref 70–140)
POTASSIUM: 3.8 meq/L (ref 3.5–5.1)
Sodium: 136 mEq/L (ref 136–145)
TOTAL PROTEIN: 7 g/dL (ref 6.4–8.3)
Total Bilirubin: 0.27 mg/dL (ref 0.20–1.20)

## 2017-03-03 NOTE — Progress Notes (Signed)
[facility]  [phone] [phone]     ID: [name] DOB: 02-14-1971  MR#: [id number]  CSN#:[id number]  Patient Care Team: Willey Blade, MD as PCP - General (Internal Medicine) Magrinat, [name], MD as Consulting Physician (Oncology) Erroll Luna, MD as Consulting Physician (General Surgery) Kyung Rudd, MD as Consulting Physician (Radiation Oncology) Scot Dock, NP OTHER MD:  CHIEF COMPLAINT: Estrogen receptor positive breast cancer  CURRENT TREATMENT: Neoadjuvant chemotherapy   HISTORY OF CURRENT ILLNESS: From the original intake note:  The patient herself noted some changes in her left breast late July 2018, she says, and eventually brought this to medical attention so that on 01/09/2017 she underwent bilateral diagnostic mammography with tomography and left breast ultrasonography at the breast Center. This found the breast density to be category C. In the upper inner quadrant of the left breast there was an area of asymmetry and there were malignant type calcifications involving all 4 quadrants. On exam there is firmness and palpable thickening in the anterior left breast with skin dimpling. Ultrasonography found at the 9:30 o'clock radiant 3 cm from the nipple a 2.7 cm mass and in the left axilla for abnormal lymph nodes largest of which measured 2.7 cm.  Biopsy of the left breast 9:30 o'clock mass 80 01/26/2017 showed ([id number]) invasive ductal carcinoma, with extracellular mucin, grade 1 or 2. In the lower outer left breast is separated biopsy the same day showed ductal carcinoma in situ. One of the 4 lymph nodes involved was positive for metastatic carcinoma. Prognostic panel on the invasive disease showed it to be estrogen receptor 100% positive, progesterone receptor 20% positive, both with strong staining intensity, with an MIB-1 of 20%, and no HER-2 amplification with a signals ratio 1.38 and the number per cell  2.90.  On 01/22/2017 the patient underwent bilateral breast MRI. This showed no involvement of the right breast, but in the left breast there was a masslike and non-masslike enhancement involving all quadrants, with skin swelling but no abnormal enhancement of the skin or nipple areolar complex or pectoralis muscle. The mass could not be clearly measured but spanned approximately 12 cm. There was bulky left axillary lymphadenopathy, with the largest lymph node measuring up to 3.4 cm.  The patient's subsequent history is as detailed below.  INTERVAL HISTORY: Corie is here today for evaluation after receiving her second cycle of Doxorubicin and Cyclophosphamide with neulasta support.  She tolerated treatment well.  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    REVIEW OF SYSTEMS: Artis continues to walk.  She is walking 1-3 miles per day.  She does note that fatigue is starting to catch up with her.  She denies any other issues currently and a detailed ROS is otherwise non contributory.    PAST MEDICAL HISTORY: Past Medical History:  Diagnosis Date  . Anemia   . Breast cancer ([facility]) 01/14/2017   Left breast  . Diabetes mellitus    GDM    PAST SURGICAL HISTORY: Past Surgical History:  Procedure Laterality Date  . CESAREAN SECTION     x2  . PORTACATH PLACEMENT Right 01/28/2017   Procedure: INSERTION PORT-A-CATH WITH ULTRASOUND;  Surgeon: Erroll Luna, MD;  Location: [facility];  Service: General;  Laterality: Right;  . TUBAL LIGATION      FAMILY HISTORY Family History  Problem Relation Age of Onset  . Breast cancer Paternal Grandmother 54  d.60s from breast cancer. Did not have treatment.  . Other Mother        Z.66 from complications of surgery to remove brain tumor  The patient's father still alive at age 85. The patient's mother died with a brain tumor which the patient says was "benign". She was 56. The  patient has one brother, no sisters. The only breast cancer in the family is a paternal grandmother who died from breast cancer at an unknown age. There is no history of ovarian or prostate cancer in the family.  GYNECOLOGIC HISTORY:  Patient's last menstrual period was 02/10/2017. Menarche age 32 and first live birth age 59 she is [place] P4. The person is still having regular periods as of August 2018  SOCIAL HISTORY: (As of August 2018) Emmaly works at [employer] in the fifth floor [place]. Her husband [name] is a Building control surveyor. Daughter and married and lives in [place] and works as a Education administrator. Daughter [name] lives in [place] and works in the police department. Daughters [place] and [place] are 20 and 6, living at home. The patient has no grandchildren. She attends a Tour manager   ADVANCED DIRECTIVES: Not in place   HEALTH MAINTENANCE: Social History  Substance Use Topics  . Smoking status: Never Smoker  . Smokeless tobacco: Never Used  . Alcohol use Yes     Comment: social     Colonoscopy: Never  PAP: December 2017  Bone density: Never   No Known Allergies  Current Outpatient Prescriptions  Medication Sig Dispense Refill  . benzonatate (TESSALON) 100 MG capsule Take 100 mg by mouth as needed. For cough    . cetirizine (ZYRTEC) 10 MG tablet Take 10 mg by mouth daily.      Marland Kitchen dexamethasone (DECADRON) 4 MG tablet Take 2 tablets by mouth once a day on the day after chemotherapy and then take 2 tablets two times a day for 2 days. Take with food. 30 tablet 1  . HYDROcodone-homatropine (HYCODAN) 5-1.5 MG/5ML syrup Take 5 mLs by mouth 3 (three) times daily as needed.    . lidocaine-prilocaine (EMLA) cream Apply to affected area once 30 g 3  . loratadine (CLARITIN) 10 MG tablet Take 1 tablet (10 mg total) by mouth daily. 90 tablet 2  . LORazepam (ATIVAN) 0.5 MG tablet Take 1 tablet (0.5 mg total) by mouth at bedtime as needed (Nausea or vomiting). 30 tablet 0  .  montelukast (SINGULAIR) 10 MG tablet Take 10 mg by mouth at bedtime. prn    . Multiple Vitamin (MULTIVITAMIN) tablet Take 1 tablet by mouth daily.    Marland Kitchen nystatin (MYCOSTATIN) 100000 UNIT/ML suspension Take 5 mLs (500,000 Units total) by mouth 4 (four) times daily. 60 mL 0  . Omega-3 Fatty Acids (FISH OIL) 1000 MG CAPS Take 1,000 mg by mouth 2 (two) times daily.     Marland Kitchen oxyCODONE (OXY IR/ROXICODONE) 5 MG immediate release tablet Take 1-2 tablets (5-10 mg total) by mouth every 6 (six) hours as needed for severe pain. 20 tablet 0  . prochlorperazine (COMPAZINE) 10 MG tablet Take 1 tablet (10 mg total) by mouth every 6 (six) hours as needed (Nausea or vomiting). 30 tablet 1   No current facility-administered medications for this visit.     OBJECTIVE:   Vitals:   03/03/17 1324  BP: (!) 147/78  Pulse: (!) 103  Resp: (!) 24  Temp: 98.4 F (36.9 C)  SpO2: 100%     Body mass index is 45.42 kg/m.  Wt Readings from Last 3 Encounters:  03/03/17 281 lb 6.4 oz (127.6 kg)  02/24/17 282 lb 14.4 oz (128.3 kg)  02/17/17 278 lb 6.4 oz (126.3 kg)      ECOG FS:1 - Symptomatic but completely ambulatory GENERAL: Patient is a well appearing female in no acute distress HEENT:  Sclerae anicteric.  Oropharynx clear and moist. No ulcerations or evidence of oropharyngeal candidiasis. Neck is supple.  NODES:  No cervical, supraclavicular, or axillary lymphadenopathy palpated.  BREAST EXAM:  Deferred. LUNGS:  Clear to auscultation bilaterally.  No wheezes or rhonchi. HEART:  Regular rate and rhythm. No murmur appreciated. ABDOMEN:  Soft, nontender.  Positive, normoactive bowel sounds. No organomegaly palpated. MSK:  No focal spinal tenderness to palpation. Full range of motion bilaterally in the upper extremities. EXTREMITIES:  No peripheral edema.   SKIN:  Clear with no obvious rashes or skin changes. No nail dyscrasia. NEURO:  Nonfocal. Well oriented.  Appropriate affect.     LAB  RESULTS:    Appointment on 03/03/2017  Component Date Value Ref Range Status  . WBC 03/03/2017 3.8* 3.9 - 10.3 10e3/uL Final  . NEUT# 03/03/2017 2.5  1.5 - 6.5 10e3/uL Final  . HGB 03/03/2017 10.3* 11.6 - 15.9 g/dL Final  . HCT 03/03/2017 31.6* 34.8 - 46.6 % Final  . Platelets 03/03/2017 255  145 - 400 10e3/uL Final  . MCV 03/03/2017 79.8  79.5 - 101.0 fL Final  . MCH 03/03/2017 26.1  25.1 - 34.0 pg Final  . MCHC 03/03/2017 32.7  31.5 - 36.0 g/dL Final  . RBC 03/03/2017 3.95  3.70 - 5.45 10e6/uL Final  . RDW 03/03/2017 16.4* 11.2 - 14.5 % Final  . lymph# 03/03/2017 0.9  0.9 - 3.3 10e3/uL Final  . MONO# 03/03/2017 0.4  0.1 - 0.9 10e3/uL Final  . Eosinophils Absolute 03/03/2017 0.0  0.0 - 0.5 10e3/uL Final  . Basophils Absolute 03/03/2017 0.0  0.0 - 0.1 10e3/uL Final  . NEUT% 03/03/2017 64.3  38.4 - 76.8 % Final  . LYMPH% 03/03/2017 24.2  14.0 - 49.7 % Final  . MONO% 03/03/2017 10.3  0.0 - 14.0 % Final  . EOS% 03/03/2017 0.2  0.0 - 7.0 % Final  . BASO% 03/03/2017 1.0  0.0 - 2.0 % Final       STUDIES: Ct Chest W Contrast  Result Date: 02/06/2017 CLINICAL DATA:  Breast cancer with suspected metastases EXAM: CT CHEST WITH CONTRAST TECHNIQUE: Multidetector CT imaging of the chest was performed during intravenous contrast administration. CONTRAST:  82m ISOVUE-300 IOPAMIDOL (ISOVUE-300) INJECTION 61% COMPARISON:  CTA chest dated 03/13/2007 FINDINGS: Cardiovascular: The heart is normal in size. No pericardial effusion. Right chest port terminates the cavoatrial junction. Mediastinum/Nodes: No suspicious mediastinal or hilar lymphadenopathy. Left axillary lymph nodes measuring up to 2.7 cm short axis (series 3/image 37). Left subpectoral nodes measuring up to 1.3 cm short axis (series 3/image 30). Lungs/Pleura:  No suspicious pulmonary nodules. No focal consolidation. No pleural effusion or pneumothorax. Upper Abdomen: Central gallstones (series 3/image 142). Otherwise unremarkable.  Musculoskeletal: 4.6 x 4.9 cm mass in the medial left breast (series 3/image 46). Overlying skin thickening. Mild degenerative changes of the upper thoracic spine. IMPRESSION: 4.6 x 4.9 cm mass in the medial left breast, corresponding to known primary breast neoplasm. Overlying left breast skin thickening, likely reflecting post-treatment changes. Associated left axillary and subpectoral nodal metastases. Electronically Signed   By: SJulian HyM.D.   On: 02/06/2017 09:20   Mr BJeri CosWZC  Contrast  Result Date: 02/06/2017 CLINICAL DATA:  Breast cancer, persistent headaches. Assess for intracranial metastasis. EXAM: MRI HEAD WITHOUT AND WITH CONTRAST TECHNIQUE: Multiplanar, multiecho pulse sequences of the brain and surrounding structures were obtained without and with intravenous contrast. CONTRAST:  3m MULTIHANCE GADOBENATE DIMEGLUMINE 529 MG/ML IV SOLN COMPARISON:  None. FINDINGS: Motion degraded postcontrast imaging. INTRACRANIAL CONTENTS: No reduced diffusion to suggest acute ischemia or hypercellular tumor. No susceptibility artifact to suggest hemorrhage. Susceptibility artifact along the anterior falx compatible with dural calcifications The ventricles and sulci are normal for patient's age. No suspicious parenchymal signal, masses, mass effect. No abnormal intraparenchymal or extra-axial enhancement. A few nonspecific punctate supratentorial white matter FLAIR T2 hyperintensities. No abnormal extra-axial fluid collections. No extra-axial masses. VASCULAR: Normal major intracranial vascular flow voids present at skull base. SKULL AND UPPER CERVICAL SPINE: No abnormal sellar expansion. No suspicious calvarial bone marrow signal. Craniocervical junction maintained. SINUSES/ORBITS: The mastoid air-cells and included paranasal sinuses are well-aerated.The included ocular globes and orbital contents are non-suspicious. OTHER: None. IMPRESSION: No intracranial metastasis; negative mildly motion degraded  MRI of the head with and without contrast. Electronically Signed   By: CElon AlasM.D.   On: 02/06/2017 04:14   Mr Lumbar Spine W Wo Contrast  Result Date: 02/17/2017 CLINICAL DATA:  History of breast cancer. Evaluate for metastatic disease for staging. EXAM: MRI LUMBAR SPINE WITHOUT AND WITH CONTRAST TECHNIQUE: Multiplanar and multiecho pulse sequences of the lumbar spine were obtained without and with intravenous contrast. CONTRAST:  276mMULTIHANCE GADOBENATE DIMEGLUMINE 529 MG/ML IV SOLN COMPARISON:  Bone scan 02/05/2017 FINDINGS: Segmentation:  Standard. Alignment:  Physiologic. Vertebrae: No fracture, evidence of discitis, or aggressive bone lesion. 16 mm T1 and T2 hyperintense L4 vertebral body bone lesion most consistent with a hemangioma. Conus medullaris: Extends to the T12 level and appears normal. Paraspinal and other soft tissues: Negative. Disc levels: Disc spaces: Disc spaces are maintained. T12-L1: No significant disc bulge. No evidence of neural foraminal stenosis. No central canal stenosis. L1-L2: No significant disc bulge. No evidence of neural foraminal stenosis. No central canal stenosis. L2-L3: No significant disc bulge. No evidence of neural foraminal stenosis. No central canal stenosis. L3-L4: No significant disc bulge. No evidence of neural foraminal stenosis. No central canal stenosis. Mild bilateral facet arthropathy. L4-L5: No significant disc bulge. No evidence of neural foraminal stenosis. No central canal stenosis. Moderate left and mild right facet arthropathy. L5-S1: No significant disc bulge. No evidence of neural foraminal stenosis. No central canal stenosis. Mild bilateral facet arthropathy. IMPRESSION: 1. No abnormal bone lesions involving the lumbar spine to suggest metastatic disease. 2. Mild lumbar spine spondylosis. Electronically Signed   By: HeKathreen Devoid On: 02/17/2017 10:06   Nm Bone Scan Whole Body  Result Date: 02/05/2017 CLINICAL DATA:  LEFT breast  cancer pre chemotherapy, diabetes mellitus EXAM: NUCLEAR MEDICINE WHOLE BODY BONE SCAN TECHNIQUE: Whole body anterior and posterior images were obtained approximately 3 hours after intravenous injection of radiopharmaceutical. RADIOPHARMACEUTICALS:  19.8 mCi Technetium-9953mP IV COMPARISON:  None Radiographic correlation:  CT chest 02/05/2017 FINDINGS: Uptake in the shoulders, elbows, knees and feet typically degenerative. Uptake in the lumbar spine at approximately L2, nonspecific. Uptake in the lower lumbar spine bilaterally at L4 favor degenerative though metastatic disease not excluded. Questionable abnormal uptake at single anterior BILATERAL ribs. Questionable foci of abnormal increased tracer localization at the inferior LEFT scapula without CT correlate. No other definite sites of abnormal osseous tracer accumulation. Uptake of tracer in  the breasts bilaterally which which is nonspecific, on the LEFT likely related to trabeculation and skin thickening as seen on CT. Expected urinary tract excretion of tracer. IMPRESSION: Scattered degenerative type uptake with additional nonspecific tracer localization at L2 vertebral body, BILATERAL anterior ribs, and questionably the inferior LEFT scapula. Osseous metastases not excluded. Consider MR imaging the lumbar spine to exclude vertebral metastases. Electronically Signed   By: Lavonia Dana M.D.   On: 02/05/2017 19:23    ELIGIBLE FOR AVAILABLE RESEARCH PROTOCOL: no  ASSESSMENT: 46 y.o. [place] woman status post left breast upper inner quadrant biopsy 01/14/2017 for a clinical T3 N2, stage IIA invasive ductal carcinoma, grade 1 or 2, estrogen and progesterone receptor positive, HER-2 nonamplified, with an MIB-1 of 20%.  (1) staging studies: Brain MRI, bone scan, and CT scan of the chest 02/05/2017 showed no brain lesions, no lung or liver lesions, a 4.9 cm mass in the left breast with left axillary and subpectoral adenopathy, and nonspecific bone scan  tracer at L2, left scapula, and anterior ribs, with lumbar spine MRI suggested for further evaluation.  (2) neoadjuvant chemotherapy will consist of cyclophosphamide and doxorubicin in dose dense fashion 4 starting 02/10/2017, followed by weekly paclitaxel 12  (3) definitive surgery to follow, most likely left modified radical mastectomy  (4) postmastectomy radiation is indicated  (5) adjuvant anti-estrogens to follow  (6) genetics testing pending (drawn 02/17/2017)  PLAN: Gerri is doing well today.  She tolerated chemotherapy well.  Her labs are stable and I reviewed these with her in detail.  She is going to take some b vitamins for her numbness.  I congratulated her on continuing to exercise.  She is doing well with this.  We reviewed possible side effects of Paclitaxel at her request today as well.  She will return in one we week for labs, follow up and cycle 3 of Doxorubicin and Cyclophosphamide.      She knows to call for any problems that may develop before that visit.  A total of (30) minutes of face-to-face time was spent with this patient with greater than 50% of that time in counseling and care-coordination.   Scot Dock, NP   03/03/2017 1:49 PM Medical Oncology and Hematology [facility] [address] [place], [place] [address] Tel. [phone]    Fax. [phone]

## 2017-03-09 DIAGNOSIS — R232 Flushing: Secondary | ICD-10-CM

## 2017-03-09 HISTORY — DX: Flushing: R23.2

## 2017-03-10 ENCOUNTER — Other Ambulatory Visit: Payer: 59

## 2017-03-10 ENCOUNTER — Ambulatory Visit: Payer: 59

## 2017-03-10 ENCOUNTER — Other Ambulatory Visit (HOSPITAL_BASED_OUTPATIENT_CLINIC_OR_DEPARTMENT_OTHER): Payer: 59

## 2017-03-10 ENCOUNTER — Ambulatory Visit (HOSPITAL_BASED_OUTPATIENT_CLINIC_OR_DEPARTMENT_OTHER): Payer: 59 | Admitting: Adult Health

## 2017-03-10 ENCOUNTER — Encounter: Payer: Self-pay | Admitting: Adult Health

## 2017-03-10 ENCOUNTER — Telehealth: Payer: Self-pay | Admitting: Adult Health

## 2017-03-10 ENCOUNTER — Ambulatory Visit (HOSPITAL_BASED_OUTPATIENT_CLINIC_OR_DEPARTMENT_OTHER): Payer: 59

## 2017-03-10 VITALS — BP 148/86 | HR 92 | Temp 97.7°F | Resp 17 | Ht 66.0 in | Wt 283.2 lb

## 2017-03-10 DIAGNOSIS — Z170 Estrogen receptor positive status [ER+]: Secondary | ICD-10-CM

## 2017-03-10 DIAGNOSIS — C50812 Malignant neoplasm of overlapping sites of left female breast: Secondary | ICD-10-CM

## 2017-03-10 DIAGNOSIS — M7989 Other specified soft tissue disorders: Secondary | ICD-10-CM

## 2017-03-10 DIAGNOSIS — C773 Secondary and unspecified malignant neoplasm of axilla and upper limb lymph nodes: Secondary | ICD-10-CM

## 2017-03-10 DIAGNOSIS — C50912 Malignant neoplasm of unspecified site of left female breast: Secondary | ICD-10-CM

## 2017-03-10 DIAGNOSIS — Z5111 Encounter for antineoplastic chemotherapy: Secondary | ICD-10-CM | POA: Diagnosis not present

## 2017-03-10 DIAGNOSIS — Z95828 Presence of other vascular implants and grafts: Secondary | ICD-10-CM

## 2017-03-10 LAB — COMPREHENSIVE METABOLIC PANEL
ALBUMIN: 3.6 g/dL (ref 3.5–5.0)
ALK PHOS: 112 U/L (ref 40–150)
ALT: 17 U/L (ref 0–55)
ANION GAP: 9 meq/L (ref 3–11)
AST: 15 U/L (ref 5–34)
BUN: 17.6 mg/dL (ref 7.0–26.0)
CALCIUM: 9.5 mg/dL (ref 8.4–10.4)
CO2: 23 mEq/L (ref 22–29)
Chloride: 105 mEq/L (ref 98–109)
Creatinine: 0.8 mg/dL (ref 0.6–1.1)
Glucose: 197 mg/dl — ABNORMAL HIGH (ref 70–140)
Potassium: 3.6 mEq/L (ref 3.5–5.1)
Sodium: 137 mEq/L (ref 136–145)
TOTAL PROTEIN: 7 g/dL (ref 6.4–8.3)

## 2017-03-10 LAB — CBC WITH DIFFERENTIAL/PLATELET
BASO%: 0.7 % (ref 0.0–2.0)
BASOS ABS: 0 10*3/uL (ref 0.0–0.1)
EOS ABS: 0 10*3/uL (ref 0.0–0.5)
EOS%: 0.1 % (ref 0.0–7.0)
HEMATOCRIT: 32.1 % — AB (ref 34.8–46.6)
HEMOGLOBIN: 10.5 g/dL — AB (ref 11.6–15.9)
LYMPH#: 0.7 10*3/uL — AB (ref 0.9–3.3)
LYMPH%: 11.2 % — ABNORMAL LOW (ref 14.0–49.7)
MCH: 26.4 pg (ref 25.1–34.0)
MCHC: 32.7 g/dL (ref 31.5–36.0)
MCV: 80.8 fL (ref 79.5–101.0)
MONO#: 0.7 10*3/uL (ref 0.1–0.9)
MONO%: 11 % (ref 0.0–14.0)
NEUT%: 77 % — ABNORMAL HIGH (ref 38.4–76.8)
NEUTROS ABS: 4.9 10*3/uL (ref 1.5–6.5)
Platelets: 181 10*3/uL (ref 145–400)
RBC: 3.97 10*6/uL (ref 3.70–5.45)
RDW: 16.3 % — AB (ref 11.2–14.5)
WBC: 6.4 10*3/uL (ref 3.9–10.3)

## 2017-03-10 MED ORDER — SODIUM CHLORIDE 0.9 % IV SOLN
600.0000 mg/m2 | Freq: Once | INTRAVENOUS | Status: AC
  Administered 2017-03-10: 1460 mg via INTRAVENOUS
  Filled 2017-03-10: qty 73

## 2017-03-10 MED ORDER — PALONOSETRON HCL INJECTION 0.25 MG/5ML
0.2500 mg | Freq: Once | INTRAVENOUS | Status: AC
  Administered 2017-03-10: 0.25 mg via INTRAVENOUS

## 2017-03-10 MED ORDER — SODIUM CHLORIDE 0.9% FLUSH
10.0000 mL | INTRAVENOUS | Status: DC | PRN
  Filled 2017-03-10: qty 10

## 2017-03-10 MED ORDER — DOXORUBICIN HCL CHEMO IV INJECTION 2 MG/ML
60.0000 mg/m2 | Freq: Once | INTRAVENOUS | Status: AC
  Administered 2017-03-10: 146 mg via INTRAVENOUS
  Filled 2017-03-10: qty 73

## 2017-03-10 MED ORDER — PALONOSETRON HCL INJECTION 0.25 MG/5ML
INTRAVENOUS | Status: AC
  Filled 2017-03-10: qty 5

## 2017-03-10 MED ORDER — SODIUM CHLORIDE 0.9% FLUSH
10.0000 mL | INTRAVENOUS | Status: AC | PRN
  Administered 2017-03-10 (×2): 10 mL via INTRAVENOUS
  Filled 2017-03-10: qty 10

## 2017-03-10 MED ORDER — HEPARIN SOD (PORK) LOCK FLUSH 100 UNIT/ML IV SOLN
500.0000 [IU] | Freq: Once | INTRAVENOUS | Status: AC | PRN
Start: 2017-03-10 — End: 2017-03-10
  Administered 2017-03-10: 500 [IU]
  Filled 2017-03-10: qty 5

## 2017-03-10 MED ORDER — PEGFILGRASTIM 6 MG/0.6ML ~~LOC~~ PSKT
6.0000 mg | PREFILLED_SYRINGE | Freq: Once | SUBCUTANEOUS | Status: AC
  Administered 2017-03-10: 6 mg via SUBCUTANEOUS
  Filled 2017-03-10: qty 0.6

## 2017-03-10 MED ORDER — SODIUM CHLORIDE 0.9 % IV SOLN
Freq: Once | INTRAVENOUS | Status: AC
  Administered 2017-03-10: 10:00:00 via INTRAVENOUS

## 2017-03-10 MED ORDER — SODIUM CHLORIDE 0.9 % IV SOLN
Freq: Once | INTRAVENOUS | Status: AC
  Administered 2017-03-10: 10:00:00 via INTRAVENOUS
  Filled 2017-03-10: qty 5

## 2017-03-10 NOTE — Progress Notes (Signed)
Excellent blood return from [facility] before, during and after Adriamycin push.  Patient tolerated well.

## 2017-03-10 NOTE — Patient Instructions (Signed)
Implanted Port Home Guide An implanted port is a type of central line that is placed under the skin. Central lines are used to provide IV access when treatment or nutrition needs to be given through a person's veins. Implanted ports are used for long-term IV access. An implanted port may be placed because:  You need IV medicine that would be irritating to the small veins in your hands or arms.  You need long-term IV medicines, such as antibiotics.  You need IV nutrition for a long period.  You need frequent blood draws for lab tests.  You need dialysis.  Implanted ports are usually placed in the chest area, but they can also be placed in the upper arm, the abdomen, or the leg. An implanted port has two main parts:  Reservoir. The reservoir is round and will appear as a small, raised area under your skin. The reservoir is the part where a needle is inserted to give medicines or draw blood.  Catheter. The catheter is a thin, flexible tube that extends from the reservoir. The catheter is placed into a large vein. Medicine that is inserted into the reservoir goes into the catheter and then into the vein.  How will I care for my incision site? Do not get the incision site wet. Bathe or shower as directed by your health care provider. How is my port accessed? Special steps must be taken to access the port:  Before the port is accessed, a numbing cream can be placed on the skin. This helps numb the skin over the port site.  Your health care provider uses a sterile technique to access the port. ? Your health care provider must put on a mask and sterile gloves. ? The skin over your port is cleaned carefully with an antiseptic and allowed to dry. ? The port is gently pinched between sterile gloves, and a needle is inserted into the port.  Only "non-coring" port needles should be used to access the port. Once the port is accessed, a blood return should be checked. This helps ensure that the port  is in the vein and is not clogged.  If your port needs to remain accessed for a constant infusion, a clear (transparent) bandage will be placed over the needle site. The bandage and needle will need to be changed every week, or as directed by your health care provider.  Keep the bandage covering the needle clean and dry. Do not get it wet. Follow your health care provider's instructions on how to take a shower or bath while the port is accessed.  If your port does not need to stay accessed, no bandage is needed over the port.  What is flushing? Flushing helps keep the port from getting clogged. Follow your health care provider's instructions on how and when to flush the port. Ports are usually flushed with saline solution or a medicine called heparin. The need for flushing will depend on how the port is used.  If the port is used for intermittent medicines or blood draws, the port will need to be flushed: ? After medicines have been given. ? After blood has been drawn. ? As part of routine maintenance.  If a constant infusion is running, the port may not need to be flushed.  How long will my port stay implanted? The port can stay in for as long as your health care provider thinks it is needed. When it is time for the port to come out, surgery will be   done to remove it. The procedure is similar to the one performed when the port was put in. When should I seek immediate medical care? When you have an implanted port, you should seek immediate medical care if:  You notice a bad smell coming from the incision site.  You have swelling, redness, or drainage at the incision site.  You have more swelling or pain at the port site or the surrounding area.  You have a fever that is not controlled with medicine.  This information is not intended to replace advice given to you by your health care provider. Make sure you discuss any questions you have with your health care provider. Document  Released: 05/26/2005 Document Revised: 11/01/2015 Document Reviewed: 01/31/2013 Elsevier Interactive Patient Education  2017 [employer]  

## 2017-03-10 NOTE — Progress Notes (Signed)
[place]  [phone] Fax:([phone]) [phone]     ID: Mackenzie [name] DOB: 1970-10-14  MR#: [id number]  YJE#:[id number]  Patient Care Team: Willey Blade, MD as PCP - General (Internal Medicine) Magrinat, [name], MD as Consulting Physician (Oncology) Erroll Luna, MD as Consulting Physician (General Surgery) Kyung Rudd, MD as Consulting Physician (Radiation Oncology) Scot Dock, NP OTHER MD:  CHIEF COMPLAINT: Estrogen receptor positive breast cancer  CURRENT TREATMENT: Neoadjuvant chemotherapy   HISTORY OF CURRENT ILLNESS: From the original intake note:  The patient herself noted some changes in her left breast late July 2018, she says, and eventually brought this to medical attention so that on 01/09/2017 she underwent bilateral diagnostic mammography with tomography and left breast ultrasonography at the breast Center. This found the breast density to be category C. In the upper inner quadrant of the left breast there was an area of asymmetry and there were malignant type calcifications involving all 4 quadrants. On exam there is firmness and palpable thickening in the anterior left breast with skin dimpling. Ultrasonography found at the 9:30 o'clock radiant 3 cm from the nipple a 2.7 cm mass and in the left axilla for abnormal lymph nodes largest of which measured 2.7 cm.  Biopsy of the left breast 9:30 o'clock mass 80 01/26/2017 showed ([id number]) invasive ductal carcinoma, with extracellular mucin, grade 1 or 2. In the lower outer left breast is separated biopsy the same day showed ductal carcinoma in situ. One of the 4 lymph nodes involved was positive for metastatic carcinoma. Prognostic panel on the invasive disease showed it to be estrogen receptor 100% positive, progesterone receptor 20% positive, both with strong staining intensity, with an MIB-1 of 20%, and no HER-2 amplification with a signals ratio 1.38 and the number per cell  2.90.  On 01/22/2017 the patient underwent bilateral breast MRI. This showed no involvement of the right breast, but in the left breast there was a masslike and non-masslike enhancement involving all quadrants, with skin swelling but no abnormal enhancement of the skin or nipple areolar complex or pectoralis muscle. The mass could not be clearly measured but spanned approximately 12 cm. There was bulky left axillary lymphadenopathy, with the largest lymph node measuring up to 3.4 cm.  The patient's subsequent history is as detailed below.  INTERVAL HISTORY: Mackenzie [name] is here today for evaluation prior to receiving her third cycle of Doxorubicin and Cyclophosphamide with neulasta support.  She is doing well today.  She says that her breast nodule is softer.    REVIEW OF SYSTEMS: She has been experiencing foot pain and swelling.  She is wearing support [name].  She has been eating out more, and eating more potato chips lately.  She has really been enjoying [place] food, which she didn't eat a lot before her diagnosis.    PAST MEDICAL HISTORY: Past Medical History:  Diagnosis Date  . Anemia   . Breast cancer ([place]) 01/14/2017   Left breast  . Diabetes mellitus    GDM    PAST SURGICAL HISTORY: Past Surgical History:  Procedure Laterality Date  . CESAREAN SECTION     x2  . PORTACATH PLACEMENT Right 01/28/2017   Procedure: INSERTION PORT-A-CATH WITH ULTRASOUND;  Surgeon: Erroll Luna, MD;  Location: [place] [facility];  Service: General;  Laterality: Right;  . TUBAL LIGATION      FAMILY HISTORY Family History  Problem Relation Age of Onset  . Breast cancer Paternal Grandmother 69  d.60s from breast cancer. Did not have treatment.  . Other Mother        d.56 from complications of surgery to remove brain tumor  The patient's father still alive at age 61. The patient's mother died with a brain tumor which the patient says was "benign". She was 56. The patient has one brother,  no sisters. The only breast cancer in the family is a paternal grandmother who died from breast cancer at an unknown age. There is no history of ovarian or prostate cancer in the family.  GYNECOLOGIC HISTORY:  Patient's last menstrual period was 02/10/2017. Menarche age 73 and first live birth age 56 she is GX P4. The person is still having regular periods as of August 2018  SOCIAL HISTORY: (As of August 2018) Nyala works at [employer] in the fifth floor [place]. Her husband Verdon Cummins is a Psychologist, occupational. Daughter and married and lives in [place] and works as a Pharmacologist. Daughter Shanda Bumps lives in [place] and works in the police department. Daughters [place] and [place] are 14 and 6, living at home. The patient has no grandchildren. She attends a [employer]   ADVANCED DIRECTIVES: Not in place   HEALTH MAINTENANCE: Social History  Substance Use Topics  . Smoking status: Never Smoker  . Smokeless tobacco: Never Used  . Alcohol use Yes     Comment: social     Colonoscopy: Never  PAP: December 2017  Bone density: Never   No Known Allergies  Current Outpatient Prescriptions  Medication Sig Dispense Refill  . benzonatate (TESSALON) 100 MG capsule Take 100 mg by mouth as needed. For cough    . cetirizine (ZYRTEC) 10 MG tablet Take 10 mg by mouth daily.      Marland Kitchen dexamethasone (DECADRON) 4 MG tablet Take 2 tablets by mouth once a day on the day after chemotherapy and then take 2 tablets two times a day for 2 days. Take with food. 30 tablet 1  . HYDROcodone-homatropine (HYCODAN) 5-1.5 MG/5ML syrup Take 5 mLs by mouth 3 (three) times daily as needed.    . lidocaine-prilocaine (EMLA) cream Apply to affected area once 30 g 3  . loratadine (CLARITIN) 10 MG tablet Take 1 tablet (10 mg total) by mouth daily. 90 tablet 2  . LORazepam (ATIVAN) 0.5 MG tablet Take 1 tablet (0.5 mg total) by mouth at bedtime as needed (Nausea or vomiting). 30 tablet 0  . montelukast (SINGULAIR) 10 MG  tablet Take 10 mg by mouth at bedtime. prn    . Multiple Vitamin (MULTIVITAMIN) tablet Take 1 tablet by mouth daily.    Marland Kitchen nystatin (MYCOSTATIN) 100000 UNIT/ML suspension Take 5 mLs (500,000 Units total) by mouth 4 (four) times daily. 60 mL 0  . Omega-3 Fatty Acids (FISH OIL) 1000 MG CAPS Take 1,000 mg by mouth 2 (two) times daily.     Marland Kitchen oxyCODONE (OXY IR/ROXICODONE) 5 MG immediate release tablet Take 1-2 tablets (5-10 mg total) by mouth every 6 (six) hours as needed for severe pain. 20 tablet 0  . prochlorperazine (COMPAZINE) 10 MG tablet Take 1 tablet (10 mg total) by mouth every 6 (six) hours as needed (Nausea or vomiting). 30 tablet 1   No current facility-administered medications for this visit.    Facility-Administered Medications Ordered in Other Visits  Medication Dose Route Frequency Provider Last Rate Last Dose  . sodium chloride flush (NS) 0.9 % injection 10 mL  10 mL Intravenous PRN Magrinat, Valentino Hue, MD   10 mL at  03/10/17 0845    OBJECTIVE:   Vitals:   03/10/17 0851  BP: (!) 148/86  Pulse: 92  Resp: 17  Temp: 97.7 F (36.5 C)     Body mass index is 45.71 kg/m.   Wt Readings from Last 3 Encounters:  03/10/17 283 lb 3.2 oz (128.5 kg)  03/03/17 281 lb 6.4 oz (127.6 kg)  02/24/17 282 lb 14.4 oz (128.3 kg)      ECOG FS:1 - Symptomatic but completely ambulatory GENERAL: Patient is a well appearing female in no acute distress HEENT:  Sclerae anicteric.  PERRL. Oropharynx clear and moist. No ulcerations or evidence of oropharyngeal candidiasis. Neck is supple.  NODES:  No cervical, supraclavicular, or axillary lymphadenopathy palpated.  BREAST EXAM:  Deferred. LUNGS:  Clear to auscultation bilaterally.  No wheezes or rhonchi. HEART:  Regular rate and rhythm. No murmur appreciated. ABDOMEN:  Soft, nontender.  Positive, normoactive bowel sounds. No organomegaly palpated. MSK:  No focal spinal tenderness to palpation. Full range of motion bilaterally in the upper  extremities. EXTREMITIES:  + LE edema, non pitting   SKIN:  Clear with no obvious rashes or skin changes. No nail dyscrasia. NEURO:  Nonfocal. Well oriented.  Appropriate affect.     LAB RESULTS:    Appointment on 03/10/2017  Component Date Value Ref Range Status  . WBC 03/10/2017 6.4  3.9 - 10.3 10e3/uL Final  . NEUT# 03/10/2017 4.9  1.5 - 6.5 10e3/uL Final  . HGB 03/10/2017 10.5* 11.6 - 15.9 g/dL Final  . HCT 03/10/2017 32.1* 34.8 - 46.6 % Final  . Platelets 03/10/2017 181  145 - 400 10e3/uL Final  . MCV 03/10/2017 80.8  79.5 - 101.0 fL Final  . MCH 03/10/2017 26.4  25.1 - 34.0 pg Final  . MCHC 03/10/2017 32.7  31.5 - 36.0 g/dL Final  . RBC 03/10/2017 3.97  3.70 - 5.45 10e6/uL Final  . RDW 03/10/2017 16.3* 11.2 - 14.5 % Final  . lymph# 03/10/2017 0.7* 0.9 - 3.3 10e3/uL Final  . MONO# 03/10/2017 0.7  0.1 - 0.9 10e3/uL Final  . Eosinophils Absolute 03/10/2017 0.0  0.0 - 0.5 10e3/uL Final  . Basophils Absolute 03/10/2017 0.0  0.0 - 0.1 10e3/uL Final  . NEUT% 03/10/2017 77.0* 38.4 - 76.8 % Final  . LYMPH% 03/10/2017 11.2* 14.0 - 49.7 % Final  . MONO% 03/10/2017 11.0  0.0 - 14.0 % Final  . EOS% 03/10/2017 0.1  0.0 - 7.0 % Final  . BASO% 03/10/2017 0.7  0.0 - 2.0 % Final       STUDIES: Mr Lumbar Spine W Wo Contrast  Result Date: 02/17/2017 CLINICAL DATA:  History of breast cancer. Evaluate for metastatic disease for staging. EXAM: MRI LUMBAR SPINE WITHOUT AND WITH CONTRAST TECHNIQUE: Multiplanar and multiecho pulse sequences of the lumbar spine were obtained without and with intravenous contrast. CONTRAST:  76m MULTIHANCE GADOBENATE DIMEGLUMINE 529 MG/ML IV SOLN COMPARISON:  Bone scan 02/05/2017 FINDINGS: Segmentation:  Standard. Alignment:  Physiologic. Vertebrae: No fracture, evidence of discitis, or aggressive bone lesion. 16 mm T1 and T2 hyperintense L4 vertebral body bone lesion most consistent with a hemangioma. Conus medullaris: Extends to the T12 level and appears normal.  Paraspinal and other soft tissues: Negative. Disc levels: Disc spaces: Disc spaces are maintained. T12-L1: No significant disc bulge. No evidence of neural foraminal stenosis. No central canal stenosis. L1-L2: No significant disc bulge. No evidence of neural foraminal stenosis. No central canal stenosis. L2-L3: No significant disc bulge. No evidence of neural  foraminal stenosis. No central canal stenosis. L3-L4: No significant disc bulge. No evidence of neural foraminal stenosis. No central canal stenosis. Mild bilateral facet arthropathy. L4-L5: No significant disc bulge. No evidence of neural foraminal stenosis. No central canal stenosis. Moderate left and mild right facet arthropathy. L5-S1: No significant disc bulge. No evidence of neural foraminal stenosis. No central canal stenosis. Mild bilateral facet arthropathy. IMPRESSION: 1. No abnormal bone lesions involving the lumbar spine to suggest metastatic disease. 2. Mild lumbar spine spondylosis. Electronically Signed   By: Kathreen Devoid   On: 02/17/2017 10:06    ELIGIBLE FOR AVAILABLE RESEARCH PROTOCOL: no  ASSESSMENT: 46 y.o. [place] woman status post left breast upper inner quadrant biopsy 01/14/2017 for a clinical T3 N2, stage IIA invasive ductal carcinoma, grade 1 or 2, estrogen and progesterone receptor positive, HER-2 nonamplified, with an MIB-1 of 20%.  (1) staging studies: Brain MRI, bone scan, and CT scan of the chest 02/05/2017 showed no brain lesions, no lung or liver lesions, a 4.9 cm mass in the left breast with left axillary and subpectoral adenopathy, and nonspecific bone scan tracer at L2, left scapula, and anterior ribs, with lumbar spine MRI suggested for further evaluation.  (2) neoadjuvant chemotherapy will consist of cyclophosphamide and doxorubicin in dose dense fashion 4 starting 02/10/2017, followed by weekly paclitaxel 12  (3) definitive surgery to follow, most likely left modified radical mastectomy  (4)  postmastectomy radiation is indicated  (5) adjuvant anti-estrogens to follow  (6) genetics testing pending (drawn 02/17/2017)  PLAN: Catelyn is doing well today.  She and I reviewed her CBC which is stable.  She will proceed with her third cycle of Doxorubicin and Cyclophosphamide today (as long has CMET is normal).  She and I discussed her lower extremity swelling.  She is taking in a considerably larger amount of sodium than she did before she started chemotherapy.  She and I reviewed ways to reduce this.  She verbalized understanding.  We reviewed exercise followed by elevating her feet.    Iqra will return in one week for labs and follow up.  She knows to call for any problems that may develop before that visit.  A total of (20) minutes of face-to-face time was spent with this patient with greater than 50% of that time in counseling and care-coordination.   Scot Dock, NP   03/10/2017 8:55 AM Medical Oncology and Hematology [facility] [address] [place], [place] [address] Tel. [phone]    Fax. [phone]

## 2017-03-10 NOTE — Patient Instructions (Signed)
[facility] Discharge Instructions for Patients Receiving Chemotherapy  Today you received the following chemotherapy agents:Adriamycin and Cytoxan   To help prevent nausea and vomiting after your treatment, we encourage you to take your nausea medication as directed.    If you develop nausea and vomiting that is not controlled by your nausea medication, call the clinic.   BELOW ARE SYMPTOMS THAT SHOULD BE REPORTED IMMEDIATELY:  *FEVER GREATER THAN 100.5 F  *CHILLS WITH OR WITHOUT FEVER  NAUSEA AND VOMITING THAT IS NOT CONTROLLED WITH YOUR NAUSEA MEDICATION  *UNUSUAL SHORTNESS OF BREATH  *UNUSUAL BRUISING OR BLEEDING  TENDERNESS IN MOUTH AND THROAT WITH OR WITHOUT PRESENCE OF ULCERS  *URINARY PROBLEMS  *BOWEL PROBLEMS  UNUSUAL RASH Items with * indicate a potential emergency and should be followed up as soon as possible.  Feel free to call the clinic you have any questions or concerns. The clinic phone number is [phone].  Please show the CHEMO ALERT CARD at check-in to the Emergency Department and triage nurse.   

## 2017-03-10 NOTE — Telephone Encounter (Signed)
No 10/2 los.   

## 2017-03-12 ENCOUNTER — Telehealth: Payer: Self-pay | Admitting: Genetic Counselor

## 2017-03-12 NOTE — Telephone Encounter (Signed)
LM on VM asking that she please CB.

## 2017-03-13 MED FILL — PROCHLORPERAZINE 10 MG TAB: 10 | 8 days supply | Qty: 30 | Fill #1

## 2017-03-13 MED FILL — DEXAMETHASONE 4 MG TABLET: 4 | 21 days supply | Qty: 30 | Fill #1

## 2017-03-17 ENCOUNTER — Other Ambulatory Visit (HOSPITAL_BASED_OUTPATIENT_CLINIC_OR_DEPARTMENT_OTHER): Payer: 59

## 2017-03-17 ENCOUNTER — Ambulatory Visit (HOSPITAL_BASED_OUTPATIENT_CLINIC_OR_DEPARTMENT_OTHER): Payer: 59 | Admitting: Adult Health

## 2017-03-17 VITALS — BP 144/70 | HR 106 | Temp 97.8°F | Resp 18 | Ht 66.0 in | Wt 282.8 lb

## 2017-03-17 DIAGNOSIS — Z170 Estrogen receptor positive status [ER+]: Principal | ICD-10-CM

## 2017-03-17 DIAGNOSIS — C50812 Malignant neoplasm of overlapping sites of left female breast: Secondary | ICD-10-CM

## 2017-03-17 LAB — CBC WITH DIFFERENTIAL/PLATELET
BASO%: 1 % (ref 0.0–2.0)
Basophils Absolute: 0 10*3/uL (ref 0.0–0.1)
EOS%: 0.3 % (ref 0.0–7.0)
Eosinophils Absolute: 0 10*3/uL (ref 0.0–0.5)
HCT: 29.8 % — ABNORMAL LOW (ref 34.8–46.6)
HGB: 9.6 g/dL — ABNORMAL LOW (ref 11.6–15.9)
LYMPH%: 16.4 % (ref 14.0–49.7)
MCH: 26.4 pg (ref 25.1–34.0)
MCHC: 32.2 g/dL (ref 31.5–36.0)
MCV: 82 fL (ref 79.5–101.0)
MONO#: 0.3 10*3/uL (ref 0.1–0.9)
MONO%: 9.3 % (ref 0.0–14.0)
NEUT%: 73 % (ref 38.4–76.8)
NEUTROS ABS: 2.7 10*3/uL (ref 1.5–6.5)
Platelets: 209 10*3/uL (ref 145–400)
RBC: 3.63 10*6/uL — AB (ref 3.70–5.45)
RDW: 17.7 % — ABNORMAL HIGH (ref 11.2–14.5)
WBC: 3.7 10*3/uL — AB (ref 3.9–10.3)
lymph#: 0.6 10*3/uL — ABNORMAL LOW (ref 0.9–3.3)

## 2017-03-17 LAB — COMPREHENSIVE METABOLIC PANEL
ALT: 22 U/L (ref 0–55)
AST: 12 U/L (ref 5–34)
Albumin: 3.6 g/dL (ref 3.5–5.0)
Alkaline Phosphatase: 142 U/L (ref 40–150)
Anion Gap: 7 mEq/L (ref 3–11)
BUN: 17.2 mg/dL (ref 7.0–26.0)
CO2: 26 meq/L (ref 22–29)
Calcium: 9.3 mg/dL (ref 8.4–10.4)
Chloride: 105 mEq/L (ref 98–109)
Creatinine: 0.8 mg/dL (ref 0.6–1.1)
GLUCOSE: 172 mg/dL — AB (ref 70–140)
Potassium: 3.7 mEq/L (ref 3.5–5.1)
SODIUM: 137 meq/L (ref 136–145)
TOTAL PROTEIN: 7 g/dL (ref 6.4–8.3)

## 2017-03-17 MED ORDER — NYSTATIN 100000 UNIT/ML MT SUSP: 5.0000 mL | Freq: Four times a day (QID) | OROMUCOSAL | 0 refills | Status: DC

## 2017-03-17 MED FILL — NYSTATIN 100,000 UNITS/ML S: 100000 | 12 days supply | Qty: 240 | Fill #0

## 2017-03-17 NOTE — Progress Notes (Addendum)
[place]  Telephone:([phone]) [phone] Fax:(336) [phone]     ID: Mackenzie [name] DOB: 10-31-1970  MR#: [id number]  CSN#:[id number]  Patient Care Team: Willey Blade, MD as PCP - General (Internal Medicine) Magrinat, [name], MD as Consulting Physician (Oncology) Erroll Luna, MD as Consulting Physician (General Surgery) Kyung Rudd, MD as Consulting Physician (Radiation Oncology) Scot Dock, NP OTHER MD:  CHIEF COMPLAINT: Estrogen receptor positive breast cancer  CURRENT TREATMENT: Neoadjuvant chemotherapy   HISTORY OF CURRENT ILLNESS: From the original intake note:  The patient herself noted some changes in her left breast late July 2018, she says, and eventually brought this to medical attention so that on 01/09/2017 she underwent bilateral diagnostic mammography with tomography and left breast ultrasonography at the breast Center. This found the breast density to be category C. In the upper inner quadrant of the left breast there was an area of asymmetry and there were malignant type calcifications involving all 4 quadrants. On exam there is firmness and palpable thickening in the anterior left breast with skin dimpling. Ultrasonography found at the 9:30 o'clock radiant 3 cm from the nipple a 2.7 cm mass and in the left axilla for abnormal lymph nodes largest of which measured 2.7 cm.  Biopsy of the left breast 9:30 o'clock mass 80 01/26/2017 showed ([id number]) invasive ductal carcinoma, with extracellular mucin, grade 1 or 2. In the lower outer left breast is separated biopsy the same day showed ductal carcinoma in situ. One of the 4 lymph nodes involved was positive for metastatic carcinoma. Prognostic panel on the invasive disease showed it to be estrogen receptor 100% positive, progesterone receptor 20% positive, both with strong staining intensity, with an MIB-1 of 20%, and no HER-2 amplification with a signals ratio 1.38 and the number per cell  2.90.  On 01/22/2017 the patient underwent bilateral breast MRI. This showed no involvement of the right breast, but in the left breast there was a masslike and non-masslike enhancement involving all quadrants, with skin swelling but no abnormal enhancement of the skin or nipple areolar complex or pectoralis muscle. The mass could not be clearly measured but spanned approximately 12 cm. There was bulky left axillary lymphadenopathy, with the largest lymph node measuring up to 3.4 cm.  The patient's subsequent history is as detailed below.  INTERVAL HISTORY: Mackenzie [name] is here today for evaluation after receiving her third cycle of Doxorubicin and Cyclophosphamide with neulasta support.  She is doing well today.  She denies fevers, chills.  She did do the womens only 5 k over the weekend, in addition to walking more over this past week.  Her feet hurt badly.    REVIEW OF SYSTEMS: Mackenzie [name] is doing well otherwise and a detailed ROS is non contributory.    PAST MEDICAL HISTORY: Past Medical History:  Diagnosis Date  . Anemia   . Breast cancer ([facility]) 01/14/2017   Left breast  . Diabetes mellitus    GDM    PAST SURGICAL HISTORY: Past Surgical History:  Procedure Laterality Date  . CESAREAN SECTION     x2  . PORTACATH PLACEMENT Right 01/28/2017   Procedure: INSERTION PORT-A-CATH WITH ULTRASOUND;  Surgeon: Erroll Luna, MD;  Location: [facility];  Service: General;  Laterality: Right;  . TUBAL LIGATION      FAMILY HISTORY Family History  Problem Relation Age of Onset  . Breast cancer Paternal Grandmother 38       d.60s from breast cancer. Did not have treatment.  Marland Kitchen  Other Mother        H.41 from complications of surgery to remove brain tumor  The patient's father still alive at age 43. The patient's mother died with a brain tumor which the patient says was "benign". She was 56. The patient has one brother, no sisters. The only breast cancer in the family is a paternal  grandmother who died from breast cancer at an unknown age. There is no history of ovarian or prostate cancer in the family.  GYNECOLOGIC HISTORY:  No LMP recorded. Menarche age 21 and first live birth age 27 she is [place] P4. The person is still having regular periods as of August 2018  SOCIAL HISTORY: (As of August 2018) Mackenzie [name] works at [employer] in the fifth floor [place]. Her husband Mackenzie [name] is a Building control surveyor. Daughter and married and lives in [place] and works as a Education administrator. Daughter Mackenzie [name] lives in [place] and works in the police department. Daughters Mackenzie [name] and Mackenzie [name] are 60 and 6, living at home. The patient has no grandchildren. She attends a Tour manager   ADVANCED DIRECTIVES: Not in place   HEALTH MAINTENANCE: Social History  Substance Use Topics  . Smoking status: Never Smoker  . Smokeless tobacco: Never Used  . Alcohol use Yes     Comment: social     Colonoscopy: Never  PAP: December 2017  Bone density: Never   No Known Allergies  Current Outpatient Prescriptions  Medication Sig Dispense Refill  . B Complex Vitamins (VITAMIN B COMPLEX PO) Take 1 capsule by mouth daily.    . benzonatate (TESSALON) 100 MG capsule Take 100 mg by mouth as needed. For cough    . cetirizine (ZYRTEC) 10 MG tablet Take 10 mg by mouth daily.      Marland Kitchen CRANBERRY PO Take 1 tablet by mouth 2 (two) times daily.    Marland Kitchen dexamethasone (DECADRON) 4 MG tablet Take 2 tablets by mouth once a day on the day after chemotherapy and then take 2 tablets two times a day for 2 days. Take with food. 30 tablet 1  . HYDROcodone-homatropine (HYCODAN) 5-1.5 MG/5ML syrup Take 5 mLs by mouth 3 (three) times daily as needed.    . lidocaine-prilocaine (EMLA) cream Apply to affected area once 30 g 3  . loratadine (CLARITIN) 10 MG tablet Take 1 tablet (10 mg total) by mouth daily. 90 tablet 2  . LORazepam (ATIVAN) 0.5 MG tablet Take 1 tablet (0.5 mg total) by mouth at bedtime as needed (Nausea or  vomiting). 30 tablet 0  . montelukast (SINGULAIR) 10 MG tablet Take 10 mg by mouth at bedtime. prn    . Multiple Vitamin (MULTIVITAMIN) tablet Take 1 tablet by mouth daily.    Marland Kitchen nystatin (MYCOSTATIN) 100000 UNIT/ML suspension Take 5 mLs (500,000 Units total) by mouth 4 (four) times daily. 240 mL 0  . Omega-3 Fatty Acids (FISH OIL) 1000 MG CAPS Take 1,000 mg by mouth 2 (two) times daily.     Marland Kitchen oxyCODONE (OXY IR/ROXICODONE) 5 MG immediate release tablet Take 1-2 tablets (5-10 mg total) by mouth every 6 (six) hours as needed for severe pain. 20 tablet 0  . prochlorperazine (COMPAZINE) 10 MG tablet Take 1 tablet (10 mg total) by mouth every 6 (six) hours as needed (Nausea or vomiting). 30 tablet 1   No current facility-administered medications for this visit.    Facility-Administered Medications Ordered in Other Visits  Medication Dose Route Frequency Provider Last Rate Last Dose  . sodium chloride flush (NS)  0.9 % injection 10 mL  10 mL Intravenous PRN Magrinat, Valentino Hue, MD   10 mL at 03/10/17 1239    OBJECTIVE:   Vitals:   03/17/17 1336  BP: (!) 144/70  Pulse: (!) 106  Resp: 18  Temp: 97.8 F (36.6 C)  SpO2: 100%     Body mass index is 45.65 kg/m.   Wt Readings from Last 3 Encounters:  03/17/17 282 lb 12.8 oz (128.3 kg)  03/10/17 283 lb 3.2 oz (128.5 kg)  03/03/17 281 lb 6.4 oz (127.6 kg)      ECOG FS:1 - Symptomatic but completely ambulatory GENERAL: Patient is a well appearing female in no acute distress HEENT:  Sclerae anicteric.  PERRL. Oropharynx clear and moist. No ulcerations or evidence of oropharyngeal candidiasis. Neck is supple.  NODES:  No cervical, supraclavicular, or axillary lymphadenopathy palpated.  BREAST EXAM:  Deferred. LUNGS:  Clear to auscultation bilaterally.  No wheezes or rhonchi. HEART:  Regular rate and rhythm. No murmur appreciated. ABDOMEN:  Soft, nontender.  Positive, normoactive bowel sounds. No organomegaly palpated. MSK:  No focal spinal  tenderness to palpation. Full range of motion bilaterally in the upper extremities. EXTREMITIES:  + LE edema, non pitting   SKIN:  Clear with no obvious rashes or skin changes. No nail dyscrasia. Unable to see any lesions on the foot.  ? Ecchymosis on the sole of the foot NEURO:  Nonfocal. Well oriented.  Appropriate affect.     LAB RESULTS:    Appointment on 03/17/2017  Component Date Value Ref Range Status  . WBC 03/17/2017 3.7* 3.9 - 10.3 10e3/uL Final  . NEUT# 03/17/2017 2.7  1.5 - 6.5 10e3/uL Final  . HGB 03/17/2017 9.6* 11.6 - 15.9 g/dL Final  . HCT 83/91/4156 29.8* 34.8 - 46.6 % Final  . Platelets 03/17/2017 209  145 - 400 10e3/uL Final  . MCV 03/17/2017 82.0  79.5 - 101.0 fL Final  . MCH 03/17/2017 26.4  25.1 - 34.0 pg Final  . MCHC 03/17/2017 32.2  31.5 - 36.0 g/dL Final  . RBC 95/24/7412 3.63* 3.70 - 5.45 10e6/uL Final  . RDW 03/17/2017 17.7* 11.2 - 14.5 % Final  . lymph# 03/17/2017 0.6* 0.9 - 3.3 10e3/uL Final  . MONO# 03/17/2017 0.3  0.1 - 0.9 10e3/uL Final  . Eosinophils Absolute 03/17/2017 0.0  0.0 - 0.5 10e3/uL Final  . Basophils Absolute 03/17/2017 0.0  0.0 - 0.1 10e3/uL Final  . NEUT% 03/17/2017 73.0  38.4 - 76.8 % Final  . LYMPH% 03/17/2017 16.4  14.0 - 49.7 % Final  . MONO% 03/17/2017 9.3  0.0 - 14.0 % Final  . EOS% 03/17/2017 0.3  0.0 - 7.0 % Final  . BASO% 03/17/2017 1.0  0.0 - 2.0 % Final  . Sodium 03/17/2017 137  136 - 145 mEq/L Final  . Potassium 03/17/2017 3.7  3.5 - 5.1 mEq/L Final  . Chloride 03/17/2017 105  98 - 109 mEq/L Final  . CO2 03/17/2017 26  22 - 29 mEq/L Final  . Glucose 03/17/2017 172* 70 - 140 mg/dl Final   Glucose reference range is for nonfasting patients. Fasting glucose reference range is 70- 100.  Marland Kitchen BUN 03/17/2017 17.2  7.0 - 26.0 mg/dL Final  . Creatinine 73/75/5363 0.8  0.6 - 1.1 mg/dL Final  . Total Bilirubin 03/17/2017 <0.22  0.20 - 1.20 mg/dL Final  . Alkaline Phosphatase 03/17/2017 142  40 - 150 U/L Final  . AST 03/17/2017  12  5 - 34 U/L  Final  . ALT 03/17/2017 22  0 - 55 U/L Final  . Total Protein 03/17/2017 7.0  6.4 - 8.3 g/dL Final  . Albumin 03/17/2017 3.6  3.5 - 5.0 g/dL Final  . Calcium 03/17/2017 9.3  8.4 - 10.4 mg/dL Final  . Anion Gap 03/17/2017 7  3 - 11 mEq/L Final  . EGFR 03/17/2017 >90  >90 ml/min/1.73 m2 Final   eGFR is calculated using the CKD-EPI Creatinine Equation (2009)       STUDIES: Mr Lumbar Spine W Wo Contrast  Result Date: 02/17/2017 CLINICAL DATA:  History of breast cancer. Evaluate for metastatic disease for staging. EXAM: MRI LUMBAR SPINE WITHOUT AND WITH CONTRAST TECHNIQUE: Multiplanar and multiecho pulse sequences of the lumbar spine were obtained without and with intravenous contrast. CONTRAST:  61m MULTIHANCE GADOBENATE DIMEGLUMINE 529 MG/ML IV SOLN COMPARISON:  Bone scan 02/05/2017 FINDINGS: Segmentation:  Standard. Alignment:  Physiologic. Vertebrae: No fracture, evidence of discitis, or aggressive bone lesion. 16 mm T1 and T2 hyperintense L4 vertebral body bone lesion most consistent with a hemangioma. Conus medullaris: Extends to the T12 level and appears normal. Paraspinal and other soft tissues: Negative. Disc levels: Disc spaces: Disc spaces are maintained. T12-L1: No significant disc bulge. No evidence of neural foraminal stenosis. No central canal stenosis. L1-L2: No significant disc bulge. No evidence of neural foraminal stenosis. No central canal stenosis. L2-L3: No significant disc bulge. No evidence of neural foraminal stenosis. No central canal stenosis. L3-L4: No significant disc bulge. No evidence of neural foraminal stenosis. No central canal stenosis. Mild bilateral facet arthropathy. L4-L5: No significant disc bulge. No evidence of neural foraminal stenosis. No central canal stenosis. Moderate left and mild right facet arthropathy. L5-S1: No significant disc bulge. No evidence of neural foraminal stenosis. No central canal stenosis. Mild bilateral facet arthropathy.  IMPRESSION: 1. No abnormal bone lesions involving the lumbar spine to suggest metastatic disease. 2. Mild lumbar spine spondylosis. Electronically Signed   By: HKathreen Devoid  On: 02/17/2017 10:06    ELIGIBLE FOR AVAILABLE RESEARCH PROTOCOL: no  ASSESSMENT: 46y.o. [place] woman status post left breast upper inner quadrant biopsy 01/14/2017 for a clinical T3 N2, stage IIA invasive ductal carcinoma, grade 1 or 2, estrogen and progesterone receptor positive, HER-2 nonamplified, with an MIB-1 of 20%.  (1) staging studies: Brain MRI, bone scan, and CT scan of the chest 02/05/2017 showed no brain lesions, no lung or liver lesions, a 4.9 cm mass in the left breast with left axillary and subpectoral adenopathy, and nonspecific bone scan tracer at L2, left scapula, and anterior ribs, with lumbar spine MRI suggested for further evaluation.  (2) neoadjuvant chemotherapy will consist of cyclophosphamide and doxorubicin in dose dense fashion 4 starting 02/10/2017, followed by weekly paclitaxel 12  (3) definitive surgery to follow, most likely left modified radical mastectomy  (4) postmastectomy radiation is indicated  (5) adjuvant anti-estrogens to follow  (6) genetics testing pending (drawn 02/17/2017)  PLAN:  Anona is doing moderately well today.  Dr. MJana Hakimcame in and saw her today as well.  Her labs are stable, she is not neutropenic.  I gave her a copy of her labs.   Her foot pain is related to her extensive walking and not her chemotherapy.  She was instructed to elevate her feet and take aleve if needed.    Mackenzie [name] will return in one week for labs, follow up, and cycle 4 of Doxorubicin and Cyclophosphamide.  She knows to call for any problems that  may develop before that visit.  Scot Dock, NP   03/17/2017 2:07 PM Medical Oncology and Hematology [facility] [address] [place], [place] [address] Tel. [phone]    Fax. [phone]  ADDENDUM: I examined  Mackenzie [name]'s feet and I don't see any obvious abnormality. I think she is having some tenderness and discomfort from walking the 5K this past weekend. I suggested she use very comfortable shoes, stay off her feet a little, and taken Aleve. I would anticipate within a few days the problem should have resolved but if not she will let us know  I'm delighted that she is tolerating her chemotherapy so well.  I personally saw this patient and performed a substantive portion of this encounter with the listed APP documented above.   Chauncey Cruel, MD Medical Oncology and Hematology [facility] [address] [place],  [address] Tel. [phone]    Fax. [phone]

## 2017-03-17 NOTE — Progress Notes (Signed)
error 

## 2017-03-18 ENCOUNTER — Telehealth: Payer: Self-pay | Admitting: Adult Health

## 2017-03-18 NOTE — Telephone Encounter (Signed)
No 10/8 los.  °

## 2017-03-20 NOTE — Progress Notes (Signed)
[facility]  [phone] [phone]) [phone]     ID: Mackenzie [name] DOB: 07/02/1970  MR#: [id number]  KVQ#:[id number]  Patient Care Team: Willey Blade, MD as PCP - General (Internal Medicine) Magrinat, [name], MD as Consulting Physician (Oncology) Erroll Luna, MD as Consulting Physician (General Surgery) Kyung Rudd, MD as Consulting Physician (Radiation Oncology) OTHER MD:  CHIEF COMPLAINT: Estrogen receptor positive breast cancer  CURRENT TREATMENT: Neoadjuvant chemotherapy   HISTORY OF CURRENT ILLNESS: From the original intake note:  The patient herself noted some changes in her left breast late July 2018, she says, and eventually brought this to medical attention so that on 01/09/2017 she underwent bilateral diagnostic mammography with tomography and left breast ultrasonography at the breast Center. This found the breast density to be category C. In the upper inner quadrant of the left breast there was an area of asymmetry and there were malignant type calcifications involving all 4 quadrants. On exam there is firmness and palpable thickening in the anterior left breast with skin dimpling. Ultrasonography found at the 9:30 o'clock radiant 3 cm from the nipple a 2.7 cm mass and in the left axilla for abnormal lymph nodes largest of which measured 2.7 cm.  Biopsy of the left breast 9:30 o'clock mass 80 01/26/2017 showed ([id number]) invasive ductal carcinoma, with extracellular mucin, grade 1 or 2. In the lower outer left breast is separated biopsy the same day showed ductal carcinoma in situ. One of the 4 lymph nodes involved was positive for metastatic carcinoma. Prognostic panel on the invasive disease showed it to be estrogen receptor 100% positive, progesterone receptor 20% positive, both with strong staining intensity, with an MIB-1 of 20%, and no HER-2 amplification with a signals ratio 1.38 and the number per cell 2.90.  On 01/22/2017 the  patient underwent bilateral breast MRI. This showed no involvement of the right breast, but in the left breast there was a masslike and non-masslike enhancement involving all quadrants, with skin swelling but no abnormal enhancement of the skin or nipple areolar complex or pectoralis muscle. The mass could not be clearly measured but spanned approximately 12 cm. There was bulky left axillary lymphadenopathy, with the largest lymph node measuring up to 3.4 cm.  The patient's subsequent history is as detailed below.  INTERVAL HISTORY: Mackenzie [name] is here today for follow-up and treatment of her estrogen receptor positive breast cancer. She is accompanied by her husband. Today is day 1 cycle 4 of doxorubicin and cyclophosphamide, which she has been receiving every 14 days.    REVIEW OF SYSTEMS: Mackenzie [name] endorses intermittent nausea that is mainly in the mornings. Pt reports after her last treatment, she became nauseous during the second week after treatment because she was not moving around as much. Pt reports staying active alleviates her nausea. Her energy levels have been good. 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 Patient's feet still cause her some pain and are still a little swollen after participating in a 5K a few weeks ago. She denies unusual headaches, visual changes, vomiting, or dizziness. There has been no unusual cough, phlegm production, or pleurisy. This been no change in bowel or bladder habits. She denies unexplained fatigue or unexplained weight loss, bleeding, rash, or fever. A detailed review of systems was otherwise entirely negative.    PAST MEDICAL HISTORY: Past Medical History:  Diagnosis Date   Anemia    Breast cancer ([facility]) 01/14/2017   Left breast   Diabetes  mellitus    GDM    PAST SURGICAL HISTORY: Past Surgical History:  Procedure Laterality Date   CESAREAN SECTION     x2   PORTACATH PLACEMENT Right 01/28/2017   Procedure: INSERTION  PORT-A-CATH WITH ULTRASOUND;  Surgeon: Erroll Luna, MD;  Location: [facility];  Service: General;  Laterality: Right;   TUBAL LIGATION      FAMILY HISTORY Family History  Problem Relation Age of Onset   Breast cancer Paternal Grandmother 29       d.60s from breast cancer. Did not have treatment.   Other Mother        J.82 from complications of surgery to remove brain tumor  The patient's father still alive at age 68. The patient's mother died with a brain tumor which the patient says was "benign". She was 56. The patient has one brother, no sisters. The only breast cancer in the family is a paternal grandmother who died from breast cancer at an unknown age. There is no history of ovarian or prostate cancer in the family.  GYNECOLOGIC HISTORY:  No LMP recorded. Menarche age 59 and first live birth age 89 she is [place] P4. The person is still having regular periods as of August 2018  SOCIAL HISTORY: (As of August 2018) Mackenzie [name] works at [employer] in the fifth floor [place]. Her husband [name] is a Building control surveyor. Daughter and married and lives in [place] and works as a Education administrator. Daughter [name] lives in [place] and works in the police department. Daughters [place] and [place] are 74 and 6, living at home. The patient has no grandchildren. She attends a nondenominational church   ADVANCED DIRECTIVES: Not in place   HEALTH MAINTENANCE: Social History  Substance Use Topics   Smoking status: Never Smoker   Smokeless tobacco: Never Used   Alcohol use Yes     Comment: social     Colonoscopy: Never  PAP: December 2017  Bone density: Never   No Known Allergies  Current Outpatient Prescriptions  Medication Sig Dispense Refill   B Complex Vitamins (VITAMIN B COMPLEX PO) Take 1 capsule by mouth daily.     cetirizine (ZYRTEC) 10 MG tablet Take 10 mg by mouth daily.       CRANBERRY PO Take 1 tablet by mouth 2 (two) times daily.     dexamethasone  (DECADRON) 4 MG tablet Take 2 tablets by mouth once a day on the day after chemotherapy and then take 2 tablets two times a day for 2 days. Take with food. 30 tablet 1   lidocaine-prilocaine (EMLA) cream Apply to affected area once 30 g 3   loratadine (CLARITIN) 10 MG tablet Take 1 tablet (10 mg total) by mouth daily. 90 tablet 2   LORazepam (ATIVAN) 0.5 MG tablet Take 1 tablet (0.5 mg total) by mouth at bedtime as needed (Nausea or vomiting). 30 tablet 0   montelukast (SINGULAIR) 10 MG tablet Take 10 mg by mouth at bedtime. prn     Multiple Vitamin (MULTIVITAMIN) tablet Take 1 tablet by mouth daily.     nystatin (MYCOSTATIN) 100000 UNIT/ML suspension Take 5 mLs (500,000 Units total) by mouth 4 (four) times daily. 240 mL 0   Omega-3 Fatty Acids (FISH OIL) 1000 MG CAPS Take 1,000 mg by mouth 2 (two) times daily.      prochlorperazine (COMPAZINE) 10 MG tablet Take 1 tablet (10 mg total) by mouth every 6 (six) hours as needed (Nausea or vomiting). 30 tablet 1   No  current facility-administered medications for this visit.    Facility-Administered Medications Ordered in Other Visits  Medication Dose Route Frequency Provider Last Rate Last Dose   sodium chloride flush (NS) 0.9 % injection 10 mL  10 mL Intravenous PRN Magrinat, [name], MD   10 mL at 03/10/17 1239    OBJECTIVE:   Vitals:   03/24/17 0853  BP: 125/72  Pulse: 91  Resp: 20  Temp: 98.3 F (36.8 C)  SpO2: 100%     Body mass index is 46.08 kg/m.   Wt Readings from Last 3 Encounters:  03/24/17 285 lb 8 oz (129.5 kg)  03/17/17 282 lb 12.8 oz (128.3 kg)  03/10/17 283 lb 3.2 oz (128.5 kg)      ECOG FS:1 - Symptomatic but completely ambulatory   Sclerae unicteric, pupils round and equal Oropharynx clear and moist No cervical or supraclavicular adenopathy Lungs no rales or rhonchi Heart regular rate and rhythm Abd soft, nontender, positive bowel sounds MSK no focal spinal tenderness, no upper extremity  lymphedema Neuro: nonfocal, well oriented, appropriate affect Breasts: The right breast is unremarkable. The mass in the left breast is considerably softer and somewhat smaller. I do not palpate any axillary adenopathy     LAB RESULTS:    Appointment on 03/24/2017  Component Date Value Ref Range Status   WBC 03/24/2017 6.6  3.9 - 10.3 10e3/uL Final   NEUT# 03/24/2017 5.0  1.5 - 6.5 10e3/uL Final   HGB 03/24/2017 10.2* 11.6 - 15.9 g/dL Final   HCT 03/24/2017 31.2* 34.8 - 46.6 % Final   Platelets 03/24/2017 188  145 - 400 10e3/uL Final   MCV 03/24/2017 81.3  79.5 - 101.0 fL Final   MCH 03/24/2017 26.5  25.1 - 34.0 pg Final   MCHC 03/24/2017 32.6  31.5 - 36.0 g/dL Final   RBC 03/24/2017 3.83  3.70 - 5.45 10e6/uL Final   RDW 03/24/2017 19.1* 11.2 - 14.5 % Final   lymph# 03/24/2017 0.7* 0.9 - 3.3 10e3/uL Final   MONO# 03/24/2017 0.9  0.1 - 0.9 10e3/uL Final   Eosinophils Absolute 03/24/2017 0.0  0.0 - 0.5 10e3/uL Final   Basophils Absolute 03/24/2017 0.1  0.0 - 0.1 10e3/uL Final   NEUT% 03/24/2017 75.6  38.4 - 76.8 % Final   LYMPH% 03/24/2017 10.5* 14.0 - 49.7 % Final   MONO% 03/24/2017 12.9  0.0 - 14.0 % Final   EOS% 03/24/2017 0.1  0.0 - 7.0 % Final   BASO% 03/24/2017 0.9  0.0 - 2.0 % Final       STUDIES: MRI Lumbar Spine W Wo, 02/17/2017, no abnormal findings to suggest metastatic disease. Mild lumbar spine spondylosis.   ELIGIBLE FOR AVAILABLE RESEARCH PROTOCOL: no  ASSESSMENT: 46 y.o. [place] woman status post left breast upper inner quadrant biopsy 01/14/2017 for a clinical T3 N2, stage IIA invasive ductal carcinoma, grade 1 or 2, estrogen and progesterone receptor positive, HER-2 nonamplified, with an MIB-1 of 20%.  (1) staging studies: Brain MRI, bone scan, and CT scan of the chest 02/05/2017 showed no brain lesions, no lung or liver lesions, a 4.9 cm mass in the left breast with left axillary and subpectoral adenopathy, and nonspecific bone scan  tracer at L2, left scapula, and anterior ribs, with lumbar spine MRI suggested for further evaluation.  (2) neoadjuvant chemotherapy will consist of cyclophosphamide and doxorubicin in dose dense fashion 4 starting 02/10/2017, followed by weekly paclitaxel 12  (3) definitive surgery to follow, most likely left modified radical mastectomy  (  4) postmastectomy radiation is indicated  (5) adjuvant anti-estrogens to follow  (6) genetics testing pending (drawn 02/17/2017)  PLAN:  Mackenzie [name] Completes her [facility] chemotherapy today. This is the "heart part". 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   I will see her again next week, troubleshoot side effects, but more importantly prepare her for the upcoming paclitaxel treatments. These will be considerably easier but do have a different array of possible side effects that we will need to discuss  I have encouraged her to call us with any problems that may develop before her next visit here.   Magrinat, [name], MD  03/24/17 9:04 AM Medical Oncology and Hematology [facility] [address] [place],  [address] Tel. [phone]    Fax. [phone]  This document serves as a record of services personally performed by Chauncey Cruel, MD. It was created on her behalf by Margit Banda, a trained medical scribe. The creation of this record is based on the scribe's personal observations and the provider's statements to them. This document has been checked and approved by the attending provider.

## 2017-03-24 ENCOUNTER — Ambulatory Visit: Payer: 59

## 2017-03-24 ENCOUNTER — Ambulatory Visit (HOSPITAL_BASED_OUTPATIENT_CLINIC_OR_DEPARTMENT_OTHER): Payer: 59 | Admitting: Oncology

## 2017-03-24 ENCOUNTER — Ambulatory Visit (HOSPITAL_BASED_OUTPATIENT_CLINIC_OR_DEPARTMENT_OTHER): Payer: 59

## 2017-03-24 ENCOUNTER — Encounter: Payer: Self-pay | Admitting: *Deleted

## 2017-03-24 ENCOUNTER — Telehealth: Payer: Self-pay | Admitting: Oncology

## 2017-03-24 ENCOUNTER — Other Ambulatory Visit: Payer: 59

## 2017-03-24 ENCOUNTER — Other Ambulatory Visit (HOSPITAL_BASED_OUTPATIENT_CLINIC_OR_DEPARTMENT_OTHER): Payer: 59

## 2017-03-24 ENCOUNTER — Encounter: Payer: Self-pay | Admitting: Oncology

## 2017-03-24 VITALS — BP 125/72 | HR 91 | Temp 98.3°F | Resp 20 | Ht 66.0 in | Wt 285.5 lb

## 2017-03-24 DIAGNOSIS — Z170 Estrogen receptor positive status [ER+]: Secondary | ICD-10-CM

## 2017-03-24 DIAGNOSIS — Z95828 Presence of other vascular implants and grafts: Secondary | ICD-10-CM

## 2017-03-24 DIAGNOSIS — C50812 Malignant neoplasm of overlapping sites of left female breast: Secondary | ICD-10-CM | POA: Diagnosis not present

## 2017-03-24 DIAGNOSIS — R110 Nausea: Secondary | ICD-10-CM | POA: Diagnosis not present

## 2017-03-24 DIAGNOSIS — C773 Secondary and unspecified malignant neoplasm of axilla and upper limb lymph nodes: Secondary | ICD-10-CM

## 2017-03-24 DIAGNOSIS — Z5111 Encounter for antineoplastic chemotherapy: Secondary | ICD-10-CM

## 2017-03-24 DIAGNOSIS — C50912 Malignant neoplasm of unspecified site of left female breast: Secondary | ICD-10-CM

## 2017-03-24 LAB — COMPREHENSIVE METABOLIC PANEL
ALBUMIN: 3.6 g/dL (ref 3.5–5.0)
ALK PHOS: 115 U/L (ref 40–150)
ALT: 20 U/L (ref 0–55)
AST: 14 U/L (ref 5–34)
Anion Gap: 8 mEq/L (ref 3–11)
BUN: 13.4 mg/dL (ref 7.0–26.0)
CALCIUM: 9.2 mg/dL (ref 8.4–10.4)
CO2: 24 mEq/L (ref 22–29)
Chloride: 105 mEq/L (ref 98–109)
Creatinine: 0.8 mg/dL (ref 0.6–1.1)
Glucose: 172 mg/dl — ABNORMAL HIGH (ref 70–140)
POTASSIUM: 3.6 meq/L (ref 3.5–5.1)
Sodium: 137 mEq/L (ref 136–145)
Total Bilirubin: 0.23 mg/dL (ref 0.20–1.20)
Total Protein: 6.9 g/dL (ref 6.4–8.3)

## 2017-03-24 LAB — CBC WITH DIFFERENTIAL/PLATELET
BASO%: 0.9 % (ref 0.0–2.0)
Basophils Absolute: 0.1 10*3/uL (ref 0.0–0.1)
EOS%: 0.1 % (ref 0.0–7.0)
Eosinophils Absolute: 0 10*3/uL (ref 0.0–0.5)
HEMATOCRIT: 31.2 % — AB (ref 34.8–46.6)
HGB: 10.2 g/dL — ABNORMAL LOW (ref 11.6–15.9)
LYMPH#: 0.7 10*3/uL — AB (ref 0.9–3.3)
LYMPH%: 10.5 % — ABNORMAL LOW (ref 14.0–49.7)
MCH: 26.5 pg (ref 25.1–34.0)
MCHC: 32.6 g/dL (ref 31.5–36.0)
MCV: 81.3 fL (ref 79.5–101.0)
MONO#: 0.9 10*3/uL (ref 0.1–0.9)
MONO%: 12.9 % (ref 0.0–14.0)
NEUT%: 75.6 % (ref 38.4–76.8)
NEUTROS ABS: 5 10*3/uL (ref 1.5–6.5)
PLATELETS: 188 10*3/uL (ref 145–400)
RBC: 3.83 10*6/uL (ref 3.70–5.45)
RDW: 19.1 % — ABNORMAL HIGH (ref 11.2–14.5)
WBC: 6.6 10*3/uL (ref 3.9–10.3)

## 2017-03-24 MED ORDER — SODIUM CHLORIDE 0.9% FLUSH
10.0000 mL | INTRAVENOUS | Status: DC | PRN
  Administered 2017-03-24: 10 mL via INTRAVENOUS
  Filled 2017-03-24: qty 10

## 2017-03-24 MED ORDER — PALONOSETRON HCL INJECTION 0.25 MG/5ML
INTRAVENOUS | Status: AC
  Filled 2017-03-24: qty 5

## 2017-03-24 MED ORDER — DOXORUBICIN HCL CHEMO IV INJECTION 2 MG/ML
60.0000 mg/m2 | Freq: Once | INTRAVENOUS | Status: AC
  Administered 2017-03-24: 146 mg via INTRAVENOUS
  Filled 2017-03-24: qty 73

## 2017-03-24 MED ORDER — SODIUM CHLORIDE 0.9 % IV SOLN
Freq: Once | INTRAVENOUS | Status: AC
  Administered 2017-03-24: 09:00:00 via INTRAVENOUS

## 2017-03-24 MED ORDER — PALONOSETRON HCL INJECTION 0.25 MG/5ML
0.2500 mg | Freq: Once | INTRAVENOUS | Status: AC
  Administered 2017-03-24: 0.25 mg via INTRAVENOUS

## 2017-03-24 MED ORDER — PEGFILGRASTIM 6 MG/0.6ML ~~LOC~~ PSKT
6.0000 mg | PREFILLED_SYRINGE | Freq: Once | SUBCUTANEOUS | Status: AC
  Administered 2017-03-24: 6 mg via SUBCUTANEOUS
  Filled 2017-03-24: qty 0.6

## 2017-03-24 MED ORDER — SODIUM CHLORIDE 0.9 % IV SOLN
Freq: Once | INTRAVENOUS | Status: AC
  Administered 2017-03-24: 10:00:00 via INTRAVENOUS
  Filled 2017-03-24: qty 5

## 2017-03-24 MED ORDER — HEPARIN SOD (PORK) LOCK FLUSH 100 UNIT/ML IV SOLN
500.0000 [IU] | Freq: Once | INTRAVENOUS | Status: AC | PRN
Start: 2017-03-24 — End: 2017-03-24
  Administered 2017-03-24: 500 [IU]
  Filled 2017-03-24: qty 5

## 2017-03-24 MED ORDER — SODIUM CHLORIDE 0.9% FLUSH
10.0000 mL | INTRAVENOUS | Status: DC | PRN
  Administered 2017-03-24: 10 mL
  Filled 2017-03-24: qty 10

## 2017-03-24 MED ORDER — CYCLOPHOSPHAMIDE CHEMO INJECTION 1 GM
600.0000 mg/m2 | Freq: Once | INTRAMUSCULAR | Status: AC
  Administered 2017-03-24: 1460 mg via INTRAVENOUS
  Filled 2017-03-24: qty 73

## 2017-03-24 NOTE — Patient Instructions (Addendum)
[facility] Discharge Instructions for Patients Receiving Chemotherapy  Today you received the following chemotherapy agents: Adriamycin, Cytoxan  To help prevent nausea and vomiting after your treatment, we encourage you to take your nausea medication as directed.   If you develop nausea and vomiting that is not controlled by your nausea medication, call the clinic.   BELOW ARE SYMPTOMS THAT SHOULD BE REPORTED IMMEDIATELY:  *FEVER GREATER THAN 100.5 F  *CHILLS WITH OR WITHOUT FEVER  NAUSEA AND VOMITING THAT IS NOT CONTROLLED WITH YOUR NAUSEA MEDICATION  *UNUSUAL SHORTNESS OF BREATH  *UNUSUAL BRUISING OR BLEEDING  TENDERNESS IN MOUTH AND THROAT WITH OR WITHOUT PRESENCE OF ULCERS  *URINARY PROBLEMS  *BOWEL PROBLEMS  UNUSUAL RASH Items with * indicate a potential emergency and should be followed up as soon as possible.  Feel free to call the clinic should you have any questions or concerns. The clinic phone number is [phone].  Please show the CHEMO ALERT CARD at check-in to the Emergency Department and triage nurse.   

## 2017-03-24 NOTE — Telephone Encounter (Signed)
No 10/16 los.  

## 2017-03-24 NOTE — Patient Instructions (Signed)
Implanted Port Home Guide An implanted port is a type of central line that is placed under the skin. Central lines are used to provide IV access when treatment or nutrition needs to be given through a person's veins. Implanted ports are used for long-term IV access. An implanted port may be placed because:  You need IV medicine that would be irritating to the small veins in your hands or arms.  You need long-term IV medicines, such as antibiotics.  You need IV nutrition for a long period.  You need frequent blood draws for lab tests.  You need dialysis.  Implanted ports are usually placed in the chest area, but they can also be placed in the upper arm, the abdomen, or the leg. An implanted port has two main parts:  Reservoir. The reservoir is round and will appear as a small, raised area under your skin. The reservoir is the part where a needle is inserted to give medicines or draw blood.  Catheter. The catheter is a thin, flexible tube that extends from the reservoir. The catheter is placed into a large vein. Medicine that is inserted into the reservoir goes into the catheter and then into the vein.  How will I care for my incision site? Do not get the incision site wet. Bathe or shower as directed by your health care provider. How is my port accessed? Special steps must be taken to access the port:  Before the port is accessed, a numbing cream can be placed on the skin. This helps numb the skin over the port site.  Your health care provider uses a sterile technique to access the port. ? Your health care provider must put on a mask and sterile gloves. ? The skin over your port is cleaned carefully with an antiseptic and allowed to dry. ? The port is gently pinched between sterile gloves, and a needle is inserted into the port.  Only "non-coring" port needles should be used to access the port. Once the port is accessed, a blood return should be checked. This helps ensure that the port  is in the vein and is not clogged.  If your port needs to remain accessed for a constant infusion, a clear (transparent) bandage will be placed over the needle site. The bandage and needle will need to be changed every week, or as directed by your health care provider.  Keep the bandage covering the needle clean and dry. Do not get it wet. Follow your health care provider's instructions on how to take a shower or bath while the port is accessed.  If your port does not need to stay accessed, no bandage is needed over the port.  What is flushing? Flushing helps keep the port from getting clogged. Follow your health care provider's instructions on how and when to flush the port. Ports are usually flushed with saline solution or a medicine called heparin. The need for flushing will depend on how the port is used.  If the port is used for intermittent medicines or blood draws, the port will need to be flushed: ? After medicines have been given. ? After blood has been drawn. ? As part of routine maintenance.  If a constant infusion is running, the port may not need to be flushed.  How long will my port stay implanted? The port can stay in for as long as your health care provider thinks it is needed. When it is time for the port to come out, surgery will be   done to remove it. The procedure is similar to the one performed when the port was put in. When should I seek immediate medical care? When you have an implanted port, you should seek immediate medical care if:  You notice a bad smell coming from the incision site.  You have swelling, redness, or drainage at the incision site.  You have more swelling or pain at the port site or the surrounding area.  You have a fever that is not controlled with medicine.  This information is not intended to replace advice given to you by your health care provider. Make sure you discuss any questions you have with your health care provider. Document  Released: 05/26/2005 Document Revised: 11/01/2015 Document Reviewed: 01/31/2013 Elsevier Interactive Patient Education  2017 [employer]  

## 2017-03-24 NOTE — Progress Notes (Signed)
Patient came in to bring proof of income for the [employer]. Patient approved for one-time $1000 grant. Patient has a copy of the approval letter as well as the expense sheet it covers along with our outpatient pharmacy information. Patient has my card for any additional financial questions or concerns.  Patient received a gas card today and also submitted utility bills to be paid from grant.

## 2017-03-24 NOTE — Progress Notes (Signed)
[facility]  [phone] [phone]     ID: Mackenzie [name] DOB: 09-Mar-1971  MR#: [id number]  ZHY#:[id number]  Patient Care Team: Willey Blade, MD as PCP - General (Internal Medicine) Magrinat, [name], MD as Consulting Physician (Oncology) Erroll Luna, MD as Consulting Physician (General Surgery) Kyung Rudd, MD as Consulting Physician (Radiation Oncology) OTHER MD:  CHIEF COMPLAINT: Estrogen receptor positive breast cancer  CURRENT TREATMENT: Neoadjuvant chemotherapy   HISTORY OF CURRENT ILLNESS: From the original intake note:  The patient herself noted some changes in her left breast late July 2018, she says, and eventually brought this to medical attention so that on 01/09/2017 she underwent bilateral diagnostic mammography with tomography and left breast ultrasonography at the breast Center. This found the breast density to be category C. In the upper inner quadrant of the left breast there was an area of asymmetry and there were malignant type calcifications involving all 4 quadrants. On exam there is firmness and palpable thickening in the anterior left breast with skin dimpling. Ultrasonography found at the 9:30 o'clock radiant 3 cm from the nipple a 2.7 cm mass and in the left axilla for abnormal lymph nodes largest of which measured 2.7 cm.  Biopsy of the left breast 9:30 o'clock mass 80 01/26/2017 showed ([id number]) invasive ductal carcinoma, with extracellular mucin, grade 1 or 2. In the lower outer left breast is separated biopsy the same day showed ductal carcinoma in situ. One of the 4 lymph nodes involved was positive for metastatic carcinoma. Prognostic panel on the invasive disease showed it to be estrogen receptor 100% positive, progesterone receptor 20% positive, both with strong staining intensity, with an MIB-1 of 20%, and no HER-2 amplification with a signals ratio 1.38 and the number per cell 2.90.  On 01/22/2017 the  patient underwent bilateral breast MRI. This showed no involvement of the right breast, but in the left breast there was a masslike and non-masslike enhancement involving all quadrants, with skin swelling but no abnormal enhancement of the skin or nipple areolar complex or pectoralis muscle. The mass could not be clearly measured but spanned approximately 12 cm. There was bulky left axillary lymphadenopathy, with the largest lymph node measuring up to 3.4 cm.  The patient's subsequent history is as detailed below.  INTERVAL HISTORY: Mackenzie [name] is here today for follow-up and treatment of her estrogen receptor positive breast cancer. She is accompanied by her friend. She completed her fourth cycle of cyclophosphamide and doxorubicin a week ago. She generally tolerated treatment well, with no side effects requiring dose reduction surgery dose delays. Her port is working well.  She has not had a period since August 2018. She is having significant hot flashes.   This past weekend she can't type with [employer]. She would go to bed around 11 or 12 midnight on the up at 6 in the morning. She felt a little tired after that but otherwise fine  She is now ready to start the weekly paclitaxel treatments, scheduled for next week.   REVIEW OF SYSTEMS: Mackenzie [name] reports her fingertips have become more tender, worse on the left hand than the right. She had soreness in her mouth, but not mouth sores. Her port is doing well. She denies unusual headaches, visual changes, nausea, vomiting, or dizziness. There has been no unusual cough, phlegm production, or pleurisy. This been no change in bowel or bladder habits. She denies unexplained fatigue or unexplained weight loss, bleeding, rash, or fever. A detailed review  of systems was otherwise entirely stable.   PAST MEDICAL HISTORY: Past Medical History:  Diagnosis Date  . Anemia   . Breast cancer ([place]) 01/14/2017   Left breast  . Diabetes mellitus    GDM     PAST SURGICAL HISTORY: Past Surgical History:  Procedure Laterality Date  . CESAREAN SECTION     x2  . PORTACATH PLACEMENT Right 01/28/2017   Procedure: INSERTION PORT-A-CATH WITH ULTRASOUND;  Surgeon: Erroll Luna, MD;  Location: [facility];  Service: General;  Laterality: Right;  . TUBAL LIGATION      FAMILY HISTORY Family History  Problem Relation Age of Onset  . Breast cancer Paternal Grandmother 12       d.60s from breast cancer. Did not have treatment.  . Other Mother        R.11 from complications of surgery to remove brain tumor  The patient's father still alive at age 62. The patient's mother died with a brain tumor which the patient says was "benign". She was 56. The patient has one brother, no sisters. The only breast cancer in the family is a paternal grandmother who died from breast cancer at an unknown age. There is no history of ovarian or prostate cancer in the family.  GYNECOLOGIC HISTORY:  No LMP recorded. Menarche age 60 and first live birth age 32 she is [place] P4. The person is still having regular periods as of August 2018  SOCIAL HISTORY: (As of August 2018) Mackenzie [name] works at [employer] in the fifth floor [place]. Her husband [name] is a Building control surveyor. Daughter and married and lives in [place] and works as a Education administrator. Daughter [name] lives in [place] and works in the police department. Daughters [place] and [place] are 39 and 6, living at home. The patient has no grandchildren. She attends a Tour manager   ADVANCED DIRECTIVES: Not in place   HEALTH MAINTENANCE: Social History  Substance Use Topics  . Smoking status: Never Smoker  . Smokeless tobacco: Never Used  . Alcohol use Yes     Comment: social     Colonoscopy: Never  PAP: December 2017  Bone density: Never   No Known Allergies  Current Outpatient Prescriptions  Medication Sig Dispense Refill  . B Complex Vitamins (VITAMIN B COMPLEX PO) Take 1 capsule by  mouth daily.    . cetirizine (ZYRTEC) 10 MG tablet Take 10 mg by mouth daily.      Marland Kitchen CRANBERRY PO Take 1 tablet by mouth 2 (two) times daily.    Marland Kitchen dexamethasone (DECADRON) 4 MG tablet Take 2 tablets by mouth once a day on the day after chemotherapy and then take 2 tablets two times a day for 2 days. Take with food. 30 tablet 1  . lidocaine-prilocaine (EMLA) cream Apply to affected area once 30 g 3  . loratadine (CLARITIN) 10 MG tablet Take 1 tablet (10 mg total) by mouth daily. 90 tablet 2  . LORazepam (ATIVAN) 0.5 MG tablet Take 1 tablet (0.5 mg total) by mouth at bedtime as needed (Nausea or vomiting). 30 tablet 0  . montelukast (SINGULAIR) 10 MG tablet Take 10 mg by mouth at bedtime. prn    . Multiple Vitamin (MULTIVITAMIN) tablet Take 1 tablet by mouth daily.    Marland Kitchen nystatin (MYCOSTATIN) 100000 UNIT/ML suspension Take 5 mLs (500,000 Units total) by mouth 4 (four) times daily. 240 mL 0  . Omega-3 Fatty Acids (FISH OIL) 1000 MG CAPS Take 1,000 mg by mouth 2 (two) times  daily.     . prochlorperazine (COMPAZINE) 10 MG tablet Take 1 tablet (10 mg total) by mouth every 6 (six) hours as needed (Nausea or vomiting). 30 tablet 1   No current facility-administered medications for this visit.    Facility-Administered Medications Ordered in Other Visits  Medication Dose Route Frequency Provider Last Rate Last Dose  . sodium chloride flush (NS) 0.9 % injection 10 mL  10 mL Intravenous PRN Magrinat, [name], MD   10 mL at 03/10/17 1239    OBJECTIVE: Middle-aged African-American woman who appears well  Vitals:   03/31/17 1235  BP: (!) 144/64  Pulse: (!) 102  Resp: 18  Temp: 98.4 F (36.9 C)  SpO2: 99%     Body mass index is 45.98 kg/m.   Wt Readings from Last 3 Encounters:  03/31/17 284 lb 14.4 oz (129.2 kg)  03/24/17 285 lb 8 oz (129.5 kg)  03/17/17 282 lb 12.8 oz (128.3 kg)      ECOG FS:0 - Asymptomatic   Sclerae unicteric, EOMs intact Oropharynx clear and moist No cervical or  supraclavicular adenopathy Lungs no rales or rhonchi Heart regular rate and rhythm Abd soft, nontender, positive bowel sounds MSK no focal spinal tenderness, no upper extremity lymphedema Neuro: nonfocal, well oriented, appropriate affect Breasts: I currently do not palpate a mass in either breast. Both axillae are benign.   LAB RESULTS:    Appointment on 03/31/2017  Component Date Value Ref Range Status  . WBC 03/31/2017 3.0* 3.9 - 10.3 10e3/uL Final  . NEUT# 03/31/2017 2.1  1.5 - 6.5 10e3/uL Final  . HGB 03/31/2017 9.6* 11.6 - 15.9 g/dL Final  . HCT 03/31/2017 29.3* 34.8 - 46.6 % Final  . Platelets 03/31/2017 204  145 - 400 10e3/uL Final  . MCV 03/31/2017 81.6  79.5 - 101.0 fL Final  . MCH 03/31/2017 26.8  25.1 - 34.0 pg Final  . MCHC 03/31/2017 32.9  31.5 - 36.0 g/dL Final  . RBC 03/31/2017 3.59* 3.70 - 5.45 10e6/uL Final  . RDW 03/31/2017 20.1* 11.2 - 14.5 % Final  . lymph# 03/31/2017 0.5* 0.9 - 3.3 10e3/uL Final  . MONO# 03/31/2017 0.3  0.1 - 0.9 10e3/uL Final  . Eosinophils Absolute 03/31/2017 0.0  0.0 - 0.5 10e3/uL Final  . Basophils Absolute 03/31/2017 0.0  0.0 - 0.1 10e3/uL Final  . NEUT% 03/31/2017 69.6  38.4 - 76.8 % Final  . LYMPH% 03/31/2017 17.5  14.0 - 49.7 % Final  . MONO% 03/31/2017 11.4  0.0 - 14.0 % Final  . EOS% 03/31/2017 0.5  0.0 - 7.0 % Final  . BASO% 03/31/2017 1.0  0.0 - 2.0 % Final       STUDIES: MRI Lumbar Spine W Wo, 02/17/2017, no abnormal findings to suggest metastatic disease. Mild lumbar spine spondylosis.   ELIGIBLE FOR AVAILABLE RESEARCH PROTOCOL: no  ASSESSMENT: 46 y.o. [place] woman status post left breast upper inner quadrant biopsy 01/14/2017 for a clinical T3 N2, stage IIA invasive ductal carcinoma, grade 1 or 2, estrogen and progesterone receptor positive, HER-2 nonamplified, with an MIB-1 of 20%.  (1) staging studies: Brain MRI, bone scan, and CT scan of the chest 02/05/2017 showed no brain lesions, no lung or liver lesions, a  4.9 cm mass in the left breast with left axillary and subpectoral adenopathy, and nonspecific bone scan tracer at L2, left scapula, and anterior ribs, with lumbar spine MRI suggested for further evaluation.  (2) neoadjuvant chemotherapy will consist of cyclophosphamide and doxorubicin in  dose dense fashion 4 starting 02/10/2017, followed by weekly paclitaxel 12  (3) definitive surgery to follow, most likely left modified radical mastectomy  (4) postmastectomy radiation is indicated  (5) adjuvant anti-estrogens to follow  (6) genetics testing pending (drawn 02/17/2017)  PLAN:  Mackenzie [name] did terrific with the first, more difficult part of her chemotherapy, namely cyclophosphamide and doxorubicin. She is now ready to start the second part, which is longer but less intense  She will receive paclitaxel weekly 12, beginning a week from today. We discussed the possible toxicities, side effects and complications.  In particular I alerted her regarding issues of neuropathy. She is currently having some nail bed pain, but that is less of a concern. The nails will become discolored, may be painful, and may even falloff, but they will completely grow back. The neuropathy which is on the other side of the finger, in the finger pads, may be permanent if it develops  She will take Compazine on the evening of treatment and the next morning but otherwise only as needed. She will not take dexamethasone with these chemotherapy treatments  I have encouraged her to call us with any problems. Otherwise we will see her again in the next 2 weeks and after that every 2 weeks for the next 12 weeks of therapy.  Magrinat, [name], MD  03/31/17 12:41 PM Medical Oncology and Hematology [facility] [address] [place], [place] [address] Tel. [phone]    Fax. [phone]  This document serves as a record of services personally performed by Chauncey Cruel, MD. It was created on her behalf  by Margit Banda, a trained medical scribe. The creation of this record is based on the scribe's personal observations and the provider's statements to them. This document has been checked and approved by the attending provider.

## 2017-03-25 ENCOUNTER — Ambulatory Visit: Payer: Self-pay | Admitting: Genetics

## 2017-03-25 DIAGNOSIS — Z1379 Encounter for other screening for genetic and chromosomal anomalies: Secondary | ICD-10-CM

## 2017-03-25 DIAGNOSIS — Z170 Estrogen receptor positive status [ER+]: Principal | ICD-10-CM

## 2017-03-25 DIAGNOSIS — C773 Secondary and unspecified malignant neoplasm of axilla and upper limb lymph nodes: Secondary | ICD-10-CM

## 2017-03-25 DIAGNOSIS — C50912 Malignant neoplasm of unspecified site of left female breast: Secondary | ICD-10-CM

## 2017-03-25 DIAGNOSIS — Z0000 Encounter for general adult medical examination without abnormal findings: Secondary | ICD-10-CM

## 2017-03-25 DIAGNOSIS — C50812 Malignant neoplasm of overlapping sites of left female breast: Secondary | ICD-10-CM

## 2017-03-25 DIAGNOSIS — Z803 Family history of malignant neoplasm of breast: Secondary | ICD-10-CM

## 2017-03-25 HISTORY — DX: Encounter for general adult medical examination without abnormal findings: Z00.00

## 2017-03-25 NOTE — Progress Notes (Signed)
HPI: Ms. Fischl was previously seen in the [place] clinic on 02/17/2017 due to a personal and family history of breast cancer and concerns regarding a hereditary predisposition to cancer. Please refer to our prior cancer genetics clinic note for more information regarding Ms. Slemmer's medical, social and family histories, and our assessment and recommendations, at the time. Ms. Segar recent genetic test results were disclosed to her, as well as recommendations warranted by these results. These results and recommendations are discussed in more detail below.  CANCER HISTORY:    Malignant 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, estrogen receptor positive ([place])   01/23/2017 Initial Diagnosis    Malignant 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, estrogen receptor positive ([place])     03/24/2017 Genetic Testing    Patient had genetic testing due to a personal and family history of breast cancer.  The Common Hereditary Cancer Panel was ordered.  The Hereditary Gene Panel offered by [facility] includes sequencing and/or deletion duplication testing of the following 46 genes: APC, ATM, AXIN2, BARD1, BMPR1A, BRCA1, BRCA2, BRIP1, CDH1, CDKN2A (p14ARF), CDKN2A (p16INK4a), CHEK2, CTNNA1, DICER1, EPCAM (Deletion/duplication testing only), GREM1 (promoter region deletion/duplication testing only), KIT, MEN1, MLH1, MSH2, MSH3, MSH6, MUTYH, NBN, NF1, NHTL1, PALB2, PDGFRA, PMS2, POLD1, POLE, PTEN, RAD50, RAD51C, RAD51D, SDHB, SDHC, SDHD, SMAD4, SMARCA4. STK11, TP53, TSC1, TSC2, and VHL.  The following genes were evaluated for sequence changes only: SDHA and HOXB13 c.251G>A variant only.     Results: Negative, no pathogenic variants identified.  The date of this test report is 03/24/2017.          FAMILY HISTORY:  We obtained a detailed, 4-generation family history.  Significant diagnoses are listed below: Family History  Problem Relation Age of Onset  . Breast  cancer Paternal Grandmother 79       d.60s from breast cancer. Did not have treatment.  . Other Mother        H.41 from complications of surgery to remove brain tumor   Ms. Winnett has four daughters, ages 9, 58, 7, and 86. She has one full brother, age 80, who is without cancers.   Ms. Litaker mother died at age 17 from complications of a surgery to remove a reportedly benign brain tumor. Ms. Mcghee mother had one full sister, age 42, who is without cancers. Ms. Schumm mother also had a maternal half-sister, two paternal half-sisters, and four paternal half-brothers. None of her mother's siblings are known to have had cancers. Ms. Kemnitz maternal grandparents both died in their 6s without cancers.  Ms. Billiot father is 59 without cancers. Ms. Yearwood has a paternal half-sister, but does not have much information about her. Ms. Gulick has five paternal uncles. Though two are deceased, all of her uncles lived to at least 49 without cancers. Ms. Longan paternal grandmother was diagnosed with breast cancer in her 90s, did not undergo treatment, and died from the disease shortly after being diagnosed. Ms. Divirgilio paternal grandfather died before she was born of unknown cause.  Ms. Boothe is unaware of previous family history of genetic testing for hereditary cancer risks. Patient's maternal ancestors are of [place] American and Native American descent, and paternal ancestors are of African American descent. There is no reported Ashkenazi Jewish ancestry. There is no known consanguinity.  GENETIC TEST RESULTS: Genetic testing performed through [facility]'s Common Hereditary Cancers Panel reported out on 03/24/2017 showed no pathogenic mutations. The Hereditary Gene Panel offered by [facility] includes sequencing  and/or deletion duplication testing of the following 46 genes: APC, ATM, AXIN2, BARD1, BMPR1A, BRCA1, BRCA2, BRIP1, CDH1, CDKN2A (p14ARF), CDKN2A (p16INK4a), CHEK2, CTNNA1,  DICER1, EPCAM (Deletion/duplication testing only), GREM1 (promoter region deletion/duplication testing only), KIT, MEN1, MLH1, MSH2, MSH3, MSH6, MUTYH, NBN, NF1, NHTL1, PALB2, PDGFRA, PMS2, POLD1, POLE, PTEN, RAD50, RAD51C, RAD51D, SDHB, SDHC, SDHD, SMAD4, SMARCA4. STK11, TP53, TSC1, TSC2, and VHL.  The following genes were evaluated for sequence changes only: SDHA and HOXB13 c.251G>A variant only..  The test report will be scanned into EPIC and will be located under the Molecular Pathology section of the Results Review tab.A portion of the result report is included below for reference.     We discussed with Ms. Snooks that because current genetic testing is not perfect, it is possible there may be a gene mutation in one of these genes that current testing cannot detect, but that chance is small. We also discussed, that there could be another gene that has not yet been discovered, or that we have not yet tested, that is responsible for the cancer diagnoses in the family. Therefore, it is important to remain in touch with cancer genetics in the future so that we can continue to offer Ms. Westerlund the most up to date genetic testing.   ADDITIONAL GENETIC TESTING: We discussed with Ms. Tabbert that there are other genes that are associated with increased cancer risk that can be analyzed. The laboratories that offer this testing look at these additional genes via a hereditary cancer gene panel. Should Ms. Kafer wish to pursue additional genetic testing, we are happy to discuss and coordinate this testing, at any time.    CANCER SCREENING RECOMMENDATIONS: This normal result is reassuring and indicates that it is unlikely Ms. Zemanek has an increased risk of cancer due to a mutation in one of these genes.  Therefore, Ms. Quesenberry was advised to continue following the cancer screening guidelines provided by her primary and oncology healthcare providers. Other factors such as her personal and family history  may still affect her cancer risk.     RECOMMENDATIONS FOR FAMILY MEMBERS: Women in this family might be at some increased risk of developing cancer, over the general population risk, simply due to the family history of cancer. We recommended women in this family have a yearly mammogram beginning at age 48, or 79 years younger than the earliest onset of cancer, an annual clinical breast exam, and perform monthly breast self-exams. For her daughters we recommend they start annual mammograms starting by age 57.  They should inform their doctors about he family history of cancer so that they can assess their individual risk and make the appropriate screening recommendations.  Women in this family should also have a gynecological exam as recommended by their primary provider. All family members should have a colonoscopy by age 63.  FOLLOW-UP: Lastly, we discussed with Ms. Kreager that cancer genetics is a rapidly advancing field and it is possible that new genetic tests will be appropriate for her and/or her family members in the future. We encouraged her to remain in contact with cancer genetics on an annual basis so we can update her personal and family histories and let her know of advances in cancer genetics that may benefit this family.   Our contact number was provided. Ms. Sittner questions were answered to her satisfaction, and she knows she is welcome to call us at anytime with additional questions or concerns.   Ferol Luz, MS Genetic Counselor [email or web address]

## 2017-03-31 ENCOUNTER — Other Ambulatory Visit (HOSPITAL_BASED_OUTPATIENT_CLINIC_OR_DEPARTMENT_OTHER): Payer: 59

## 2017-03-31 ENCOUNTER — Ambulatory Visit (HOSPITAL_BASED_OUTPATIENT_CLINIC_OR_DEPARTMENT_OTHER): Payer: 59 | Admitting: Oncology

## 2017-03-31 ENCOUNTER — Telehealth: Payer: Self-pay | Admitting: Genetics

## 2017-03-31 VITALS — BP 144/64 | HR 102 | Temp 98.4°F | Resp 18 | Ht 66.0 in | Wt 284.9 lb

## 2017-03-31 DIAGNOSIS — C773 Secondary and unspecified malignant neoplasm of axilla and upper limb lymph nodes: Secondary | ICD-10-CM

## 2017-03-31 DIAGNOSIS — Z170 Estrogen receptor positive status [ER+]: Secondary | ICD-10-CM | POA: Diagnosis not present

## 2017-03-31 DIAGNOSIS — C50812 Malignant neoplasm of overlapping sites of left female breast: Secondary | ICD-10-CM

## 2017-03-31 DIAGNOSIS — C50912 Malignant neoplasm of unspecified site of left female breast: Secondary | ICD-10-CM

## 2017-03-31 LAB — CBC WITH DIFFERENTIAL/PLATELET
BASO%: 1 % (ref 0.0–2.0)
BASOS ABS: 0 10*3/uL (ref 0.0–0.1)
EOS ABS: 0 10*3/uL (ref 0.0–0.5)
EOS%: 0.5 % (ref 0.0–7.0)
HCT: 29.3 % — ABNORMAL LOW (ref 34.8–46.6)
HGB: 9.6 g/dL — ABNORMAL LOW (ref 11.6–15.9)
LYMPH#: 0.5 10*3/uL — AB (ref 0.9–3.3)
LYMPH%: 17.5 % (ref 14.0–49.7)
MCH: 26.8 pg (ref 25.1–34.0)
MCHC: 32.9 g/dL (ref 31.5–36.0)
MCV: 81.6 fL (ref 79.5–101.0)
MONO#: 0.3 10*3/uL (ref 0.1–0.9)
MONO%: 11.4 % (ref 0.0–14.0)
NEUT#: 2.1 10*3/uL (ref 1.5–6.5)
NEUT%: 69.6 % (ref 38.4–76.8)
PLATELETS: 204 10*3/uL (ref 145–400)
RBC: 3.59 10*6/uL — ABNORMAL LOW (ref 3.70–5.45)
RDW: 20.1 % — ABNORMAL HIGH (ref 11.2–14.5)
WBC: 3 10*3/uL — ABNORMAL LOW (ref 3.9–10.3)

## 2017-03-31 LAB — COMPREHENSIVE METABOLIC PANEL
ALT: 20 U/L (ref 0–55)
AST: 12 U/L (ref 5–34)
Albumin: 3.7 g/dL (ref 3.5–5.0)
Alkaline Phosphatase: 138 U/L (ref 40–150)
Anion Gap: 8 mEq/L (ref 3–11)
BILIRUBIN TOTAL: 0.27 mg/dL (ref 0.20–1.20)
BUN: 13.9 mg/dL (ref 7.0–26.0)
CHLORIDE: 102 meq/L (ref 98–109)
CO2: 26 mEq/L (ref 22–29)
CREATININE: 0.8 mg/dL (ref 0.6–1.1)
Calcium: 9.3 mg/dL (ref 8.4–10.4)
EGFR: >60 mL/min/{1.73_m2} (ref >60)
Glucose: 128 mg/dl (ref 70–140)
Potassium: 3.8 mEq/L (ref 3.5–5.1)
Sodium: 135 mEq/L — ABNORMAL LOW (ref 136–145)
TOTAL PROTEIN: 7 g/dL (ref 6.4–8.3)

## 2017-04-07 ENCOUNTER — Ambulatory Visit: Payer: 59

## 2017-04-07 ENCOUNTER — Encounter: Payer: Self-pay | Admitting: Adult Health

## 2017-04-07 ENCOUNTER — Encounter: Payer: Self-pay | Admitting: *Deleted

## 2017-04-07 ENCOUNTER — Ambulatory Visit (HOSPITAL_BASED_OUTPATIENT_CLINIC_OR_DEPARTMENT_OTHER): Payer: 59 | Admitting: Adult Health

## 2017-04-07 ENCOUNTER — Other Ambulatory Visit (HOSPITAL_BASED_OUTPATIENT_CLINIC_OR_DEPARTMENT_OTHER): Payer: 59

## 2017-04-07 VITALS — BP 163/86 | HR 104 | Temp 97.8°F | Resp 20 | Ht 66.0 in | Wt 286.8 lb

## 2017-04-07 DIAGNOSIS — C50812 Malignant neoplasm of overlapping sites of left female breast: Secondary | ICD-10-CM

## 2017-04-07 DIAGNOSIS — C773 Secondary and unspecified malignant neoplasm of axilla and upper limb lymph nodes: Secondary | ICD-10-CM

## 2017-04-07 DIAGNOSIS — Z95828 Presence of other vascular implants and grafts: Secondary | ICD-10-CM

## 2017-04-07 DIAGNOSIS — G620 Drug-induced polyneuropathy: Secondary | ICD-10-CM

## 2017-04-07 DIAGNOSIS — Z170 Estrogen receptor positive status [ER+]: Principal | ICD-10-CM

## 2017-04-07 LAB — COMPREHENSIVE METABOLIC PANEL
ALBUMIN: 3.5 g/dL (ref 3.5–5.0)
ALK PHOS: 108 U/L (ref 40–150)
ALT: 16 U/L (ref 0–55)
AST: 13 U/L (ref 5–34)
Anion Gap: 7 mEq/L (ref 3–11)
BUN: 13 mg/dL (ref 7.0–26.0)
CO2: 24 meq/L (ref 22–29)
CREATININE: 0.8 mg/dL (ref 0.6–1.1)
Calcium: 9 mg/dL (ref 8.4–10.4)
Chloride: 108 mEq/L (ref 98–109)
EGFR: >60 mL/min/{1.73_m2} (ref >60)
GLUCOSE: 130 mg/dL (ref 70–140)
Potassium: 3.8 mEq/L (ref 3.5–5.1)
SODIUM: 139 meq/L (ref 136–145)
TOTAL PROTEIN: 6.7 g/dL (ref 6.4–8.3)

## 2017-04-07 LAB — CBC WITH DIFFERENTIAL/PLATELET
BASO%: 0.7 % (ref 0.0–2.0)
Basophils Absolute: 0 10*3/uL (ref 0.0–0.1)
EOS ABS: 0 10*3/uL (ref 0.0–0.5)
EOS%: 0.2 % (ref 0.0–7.0)
HCT: 29.2 % — ABNORMAL LOW (ref 34.8–46.6)
HEMOGLOBIN: 9.6 g/dL — AB (ref 11.6–15.9)
LYMPH%: 8.5 % — AB (ref 14.0–49.7)
MCH: 27.1 pg (ref 25.1–34.0)
MCHC: 32.7 g/dL (ref 31.5–36.0)
MCV: 82.8 fL (ref 79.5–101.0)
MONO#: 1.1 10*3/uL — AB (ref 0.1–0.9)
MONO%: 16.4 % — ABNORMAL HIGH (ref 0.0–14.0)
NEUT%: 74.2 % (ref 38.4–76.8)
NEUTROS ABS: 4.8 10*3/uL (ref 1.5–6.5)
PLATELETS: 206 10*3/uL (ref 145–400)
RBC: 3.53 10*6/uL — AB (ref 3.70–5.45)
RDW: 21.4 % — AB (ref 11.2–14.5)
WBC: 6.5 10*3/uL (ref 3.9–10.3)
lymph#: 0.6 10*3/uL — ABNORMAL LOW (ref 0.9–3.3)

## 2017-04-07 MED ORDER — SODIUM CHLORIDE 0.9% FLUSH
10.0000 mL | INTRAVENOUS | Status: DC | PRN
  Administered 2017-04-07: 10 mL via INTRAVENOUS
  Filled 2017-04-07: qty 10

## 2017-04-07 MED ORDER — HEPARIN SOD (PORK) LOCK FLUSH 100 UNIT/ML IV SOLN
500.0000 [IU] | Freq: Once | INTRAVENOUS | Status: AC
  Administered 2017-04-07: 500 [IU] via INTRAVENOUS
  Filled 2017-04-07: qty 5

## 2017-04-07 NOTE — Progress Notes (Signed)
DISCONTINUE ON PATHWAY REGIMEN - Breast   Dose-Dense AC q14 days:   A cycle is every 14 days:     Doxorubicin      Cyclophosphamide      Pegfilgrastim   **Always confirm dose/schedule in your pharmacy ordering system**    Paclitaxel 80 mg/m2 Weekly:   Administer weekly:     Paclitaxel   **Always confirm dose/schedule in your pharmacy ordering system**    REASON: Toxicities / Adverse Event PRIOR TREATMENT: [id number]: Dose-Dense AC-T (Paclitaxel Weekly) - [Doxorubicin + Cyclophosphamide q14 Days x 4 Cycles, Followed by Paclitaxel 80 mg/m2 Weekly x 12 Weeks] TREATMENT RESPONSE: Unable to Evaluate  START OFF PATHWAY REGIMEN - Breast   [id number]:Gemcitabine + Carboplatin (1000/2) q21 Days:   A cycle is every 21 days:     Gemcitabine      Carboplatin   **Always confirm dose/schedule in your pharmacy ordering system**    Patient Characteristics: Preoperative or Nonsurgical Candidate (Clinical Staging), Neoadjuvant Therapy followed by Surgery, Invasive Disease, Chemotherapy, HER2 Negative/Unknown/Equivocal, ER Positive Therapeutic Status: Preoperative or Nonsurgical Candidate (Clinical Staging) AJCC M Category: cM0 AJCC Grade: G2 Breast Surgical Plan: Neoadjuvant Therapy followed by Surgery ER Status: Positive (+) AJCC 8 Stage Grouping: IIA HER2 Status: Negative (-) AJCC T Category: cT3 AJCC N Category: cN2 PR Status: Positive (+) Intent of Therapy: Curative Intent, Discussed with Patient

## 2017-04-07 NOTE — Patient Instructions (Signed)
Carboplatin injection  What is this medicine?  CARBOPLATIN (KAR boe pla tin) is a chemotherapy drug. It targets fast dividing cells, like cancer cells, and causes these cells to die. This medicine is used to treat ovarian cancer and many other cancers.  This medicine may be used for other purposes; ask your health care provider or pharmacist if you have questions.  COMMON BRAND NAME(S): Paraplatin  What should I tell my health care provider before I take this medicine?  They need to know if you have any of these conditions:  -blood disorders  -hearing problems  -kidney disease  -recent or ongoing radiation therapy  -an unusual or allergic reaction to carboplatin, cisplatin, other chemotherapy, other medicines, foods, dyes, or preservatives  -pregnant or trying to get pregnant  -breast-feeding  How should I use this medicine?  This drug is usually given as an infusion into a vein. It is administered in a hospital or clinic by a specially trained health care professional.  Talk to your pediatrician regarding the use of this medicine in children. Special care may be needed.  Overdosage: If you think you have taken too much of this medicine contact a poison control center or emergency room at once.  NOTE: This medicine is only for you. Do not share this medicine with others.  What if I miss a dose?  It is important not to miss a dose. Call your doctor or health care professional if you are unable to keep an appointment.  What may interact with this medicine?  -medicines for seizures  -medicines to increase blood counts like filgrastim, pegfilgrastim, sargramostim  -some antibiotics like amikacin, gentamicin, neomycin, streptomycin, tobramycin  -vaccines  Talk to your doctor or health care professional before taking any of these medicines:  -acetaminophen  -aspirin  -ibuprofen  -ketoprofen  -naproxen  This list may not describe all possible interactions. Give your health  care provider a list of all the medicines, herbs, non-prescription drugs, or dietary supplements you use. Also tell them if you smoke, drink alcohol, or use illegal drugs. Some items may interact with your medicine.  What should I watch for while using this medicine?  Your condition will be monitored carefully while you are receiving this medicine. You will need important blood work done while you are taking this medicine.  This drug may make you feel generally unwell. This is not uncommon, as chemotherapy can affect healthy cells as well as cancer cells. Report any side effects. Continue your course of treatment even though you feel ill unless your doctor tells you to stop.  In some cases, you may be given additional medicines to help with side effects. Follow all directions for their use.  Call your doctor or health care professional for advice if you get a fever, chills or sore throat, or other symptoms of a cold or flu. Do not treat yourself. This drug decreases your body's ability to fight infections. Try to avoid being around people who are sick.  This medicine may increase your risk to bruise or bleed. Call your doctor or health care professional if you notice any unusual bleeding.  Be careful brushing and flossing your teeth or using a toothpick because you may get an infection or bleed more easily. If you have any dental work done, tell your dentist you are receiving this medicine.  Avoid taking products that contain aspirin, acetaminophen, ibuprofen, naproxen, or ketoprofen unless instructed by your doctor. These medicines may hide a fever.  Do not become   pregnant while taking this medicine. Women should inform their doctor if they wish to become pregnant or think they might be pregnant. There is a potential for serious side effects to an unborn child. Talk to your health care professional or pharmacist for more information. Do not breast-feed an infant while taking this medicine.   What side effects may I notice from receiving this medicine?  Side effects that you should report to your doctor or health care professional as soon as possible:  -allergic reactions like skin rash, itching or hives, swelling of the face, lips, or tongue  -signs of infection - fever or chills, cough, sore throat, pain or difficulty passing urine  -signs of decreased platelets or bleeding - bruising, pinpoint red spots on the skin, black, tarry stools, nosebleeds  -signs of decreased red blood cells - unusually weak or tired, fainting spells, lightheadedness  -breathing problems  -changes in hearing  -changes in vision  -chest pain  -high blood pressure  -low blood counts - This drug may decrease the number of white blood cells, red blood cells and platelets. You may be at increased risk for infections and bleeding.  -nausea and vomiting  -pain, swelling, redness or irritation at the injection site  -pain, tingling, numbness in the hands or feet  -problems with balance, talking, walking  -trouble passing urine or change in the amount of urine  Side effects that usually do not require medical attention (report to your doctor or health care professional if they continue or are bothersome):  -hair loss  -loss of appetite  -metallic taste in the mouth or changes in taste  This list may not describe all possible side effects. Call your doctor for medical advice about side effects. You may report side effects to FDA at 1-800-FDA-1088.  Where should I keep my medicine?  This drug is given in a hospital or clinic and will not be stored at home.  NOTE: This sheet is a summary. It may not cover all possible information. If you have questions about this medicine, talk to your doctor, pharmacist, or health care provider.  © 2018 Elsevier/Gold Standard (2007-08-31 14:38:05)      Gemcitabine injection  What is this medicine?  GEMCITABINE (jem SIT a been) is a chemotherapy drug. This medicine is used  to treat many types of cancer like breast cancer, lung cancer, pancreatic cancer, and ovarian cancer.  This medicine may be used for other purposes; ask your health care provider or pharmacist if you have questions.  COMMON BRAND NAME(S): Gemzar  What should I tell my health care provider before I take this medicine?  They need to know if you have any of these conditions:  -blood disorders  -infection  -kidney disease  -liver disease  -recent or ongoing radiation therapy  -an unusual or allergic reaction to gemcitabine, other chemotherapy, other medicines, foods, dyes, or preservatives  -pregnant or trying to get pregnant  -breast-feeding  How should I use this medicine?  This drug is given as an infusion into a vein. It is administered in a hospital or clinic by a specially trained health care professional.  Talk to your pediatrician regarding the use of this medicine in children. Special care may be needed.  Overdosage: If you think you have taken too much of this medicine contact a poison control center or emergency room at once.  NOTE: This medicine is only for you. Do not share this medicine with others.  What if I miss a dose?    It is important not to miss your dose. Call your doctor or health care professional if you are unable to keep an appointment.  What may interact with this medicine?  -medicines to increase blood counts like filgrastim, pegfilgrastim, sargramostim  -some other chemotherapy drugs like cisplatin  -vaccines  Talk to your doctor or health care professional before taking any of these medicines:  -acetaminophen  -aspirin  -ibuprofen  -ketoprofen  -naproxen  This list may not describe all possible interactions. Give your health care provider a list of all the medicines, herbs, non-prescription drugs, or dietary supplements you use. Also tell them if you smoke, drink alcohol, or use illegal drugs. Some items may interact with your medicine.  What should I watch for while using this medicine?   Visit your doctor for checks on your progress. This drug may make you feel generally unwell. This is not uncommon, as chemotherapy can affect healthy cells as well as cancer cells. Report any side effects. Continue your course of treatment even though you feel ill unless your doctor tells you to stop.  In some cases, you may be given additional medicines to help with side effects. Follow all directions for their use.  Call your doctor or health care professional for advice if you get a fever, chills or sore throat, or other symptoms of a cold or flu. Do not treat yourself. This drug decreases your body's ability to fight infections. Try to avoid being around people who are sick.  This medicine may increase your risk to bruise or bleed. Call your doctor or health care professional if you notice any unusual bleeding.  Be careful brushing and flossing your teeth or using a toothpick because you may get an infection or bleed more easily. If you have any dental work done, tell your dentist you are receiving this medicine.  Avoid taking products that contain aspirin, acetaminophen, ibuprofen, naproxen, or ketoprofen unless instructed by your doctor. These medicines may hide a fever.  Women should inform their doctor if they wish to become pregnant or think they might be pregnant. There is a potential for serious side effects to an unborn child. Talk to your health care professional or pharmacist for more information. Do not breast-feed an infant while taking this medicine.  What side effects may I notice from receiving this medicine?  Side effects that you should report to your doctor or health care professional as soon as possible:  -allergic reactions like skin rash, itching or hives, swelling of the face, lips, or tongue  -low blood counts - this medicine may decrease the number of white blood cells, red blood cells and platelets. You may be at increased risk for infections and bleeding.   -signs of infection - fever or chills, cough, sore throat, pain or difficulty passing urine  -signs of decreased platelets or bleeding - bruising, pinpoint red spots on the skin, black, tarry stools, blood in the urine  -signs of decreased red blood cells - unusually weak or tired, fainting spells, lightheadedness  -breathing problems  -chest pain  -mouth sores  -nausea and vomiting  -pain, swelling, redness at site where injected  -pain, tingling, numbness in the hands or feet  -stomach pain  -swelling of ankles, feet, hands  -unusual bleeding  Side effects that usually do not require medical attention (report to your doctor or health care professional if they continue or are bothersome):  -constipation  -diarrhea  -hair loss  -loss of appetite  -stomach upset  This   list may not describe all possible side effects. Call your doctor for medical advice about side effects. You may report side effects to FDA at 1-800-FDA-1088.  Where should I keep my medicine?  This drug is given in a hospital or clinic and will not be stored at home.  NOTE: This sheet is a summary. It may not cover all possible information. If you have questions about this medicine, talk to your doctor, pharmacist, or health care provider.  © 2018 Elsevier/Gold Standard (2007-10-05 18:45:54)

## 2017-04-07 NOTE — Progress Notes (Signed)
Mackenzie [name]  Telephone:(336) [phone] Fax:(336) [phone]     ID: Mackenzie [name] DOB: Feb 03, 1971  MR#: [id number]  GBT#:[id number]  Patient Care Team: Willey Blade, MD as PCP - General (Internal Medicine) Magrinat, [name], MD as Consulting Physician (Oncology) Erroll Luna, MD as Consulting Physician (General Surgery) Kyung Rudd, MD as Consulting Physician (Radiation Oncology) OTHER MD:  CHIEF COMPLAINT: Estrogen receptor positive breast cancer  CURRENT TREATMENT: Neoadjuvant chemotherapy   HISTORY OF CURRENT ILLNESS: From the original intake note:  The patient herself noted some changes in her left breast late July 2018, she says, and eventually brought this to medical attention so that on 01/09/2017 she underwent bilateral diagnostic mammography with tomography and left breast ultrasonography at the breast Center. This found the breast density to be category C. In the upper inner quadrant of the left breast there was an area of asymmetry and there were malignant type calcifications involving all 4 quadrants. On exam there is firmness and palpable thickening in the anterior left breast with skin dimpling. Ultrasonography found at the 9:30 o'clock radiant 3 cm from the nipple a 2.7 cm mass and in the left axilla for abnormal lymph nodes largest of which measured 2.7 cm.  Biopsy of the left breast 9:30 o'clock mass 80 01/26/2017 showed ([id number]) invasive ductal carcinoma, with extracellular mucin, grade 1 or 2. In the lower outer left breast is separated biopsy the same day showed ductal carcinoma in situ. One of the 4 lymph nodes involved was positive for metastatic carcinoma. Prognostic panel on the invasive disease showed it to be estrogen receptor 100% positive, progesterone receptor 20% positive, both with strong staining intensity, with an MIB-1 of 20%, and no HER-2 amplification with a signals ratio 1.38 and the number per cell 2.90.  On 01/22/2017 the  patient underwent bilateral breast MRI. This showed no involvement of the right breast, but in the left breast there was a masslike and non-masslike enhancement involving all quadrants, with skin swelling but no abnormal enhancement of the skin or nipple areolar complex or pectoralis muscle. The mass could not be clearly measured but spanned approximately 12 cm. There was bulky left axillary lymphadenopathy, with the largest lymph node measuring up to 3.4 cm.  The patient's subsequent history is as detailed below.  INTERVAL HISTORY: Mackenzie [name] is here today for follow up of her breast cancer.  She is receiving neo adjuvant chemotherapy and has completed cycle 4 of Doxorubicin and Cyclophosphamide and is now here to start Taxol.  She is c/o peripheral neuropathy that started last week.  She has difficulty with opening plastic bags, tying her shoes, opening jars, and buttoning buttons.  It is only in the tips of her fingers.     REVIEW OF SYSTEMS: [name] has had some issues with odor of her feet.  She wants to know if she should get a pedicure.  She is doing well otherwise and a detailed ROS is non contributory.   PAST MEDICAL HISTORY: Past Medical History:  Diagnosis Date  . Anemia   . Breast cancer ([place]) 01/14/2017   Left breast  . Diabetes mellitus    GDM    PAST SURGICAL HISTORY: Past Surgical History:  Procedure Laterality Date  . CESAREAN SECTION     x2  . PORTACATH PLACEMENT Right 01/28/2017   Procedure: INSERTION PORT-A-CATH WITH ULTRASOUND;  Surgeon: Erroll Luna, MD;  Location: [facility];  Service: General;  Laterality: Right;  . TUBAL LIGATION  FAMILY HISTORY Family History  Problem Relation Age of Onset  . Breast cancer Paternal Grandmother 32       d.60s from breast cancer. Did not have treatment.  . Other Mother        Z.61 from complications of surgery to remove brain tumor  The patient's father still alive at age 76. The patient's mother died  with a brain tumor which the patient says was "benign". She was 56. The patient has one brother, no sisters. The only breast cancer in the family is a paternal grandmother who died from breast cancer at an unknown age. There is no history of ovarian or prostate cancer in the family.  GYNECOLOGIC HISTORY:  No LMP recorded. Menarche age 68 and first live birth age 43 she is [place] P4. The person is still having regular periods as of August 2018  SOCIAL HISTORY: (As of August 2018) Mackenzie [name] works at [employer] in the fifth floor [place]. Her husband [name] is a Building control surveyor. Daughter and married and lives in [place] and works as a Education administrator. Daughter [name] lives in [place] and works in the police department. Daughters [place] and [place] are 41 and 6, living at home. The patient has no grandchildren. She attends a Tour manager   ADVANCED DIRECTIVES: Not in place   HEALTH MAINTENANCE: Social History  Substance Use Topics  . Smoking status: Never Smoker  . Smokeless tobacco: Never Used  . Alcohol use Yes     Comment: social     Colonoscopy: Never  PAP: December 2017  Bone density: Never   No Known Allergies  Current Outpatient Prescriptions  Medication Sig Dispense Refill  . B Complex Vitamins (VITAMIN B COMPLEX PO) Take 1 capsule by mouth daily.    . cetirizine (ZYRTEC) 10 MG tablet Take 10 mg by mouth daily.      Marland Kitchen CRANBERRY PO Take 1 tablet by mouth 2 (two) times daily.    Marland Kitchen loratadine (CLARITIN) 10 MG tablet Take 1 tablet (10 mg total) by mouth daily. 90 tablet 2  . montelukast (SINGULAIR) 10 MG tablet Take 10 mg by mouth at bedtime. prn    . Multiple Vitamin (MULTIVITAMIN) tablet Take 1 tablet by mouth daily.    Marland Kitchen nystatin (MYCOSTATIN) 100000 UNIT/ML suspension Take 5 mLs (500,000 Units total) by mouth 4 (four) times daily. 240 mL 0  . Omega-3 Fatty Acids (FISH OIL) 1000 MG CAPS Take 1,000 mg by mouth 2 (two) times daily.     . benzonatate (TESSALON) 100 MG  capsule Take 1 capsule by mouth every 8 (eight) hours as needed.  0  . HYDROcodone-homatropine (HYCODAN) 5-1.5 MG/5ML syrup Take 5 mLs by mouth every 6 (six) hours as needed.  0   Current Facility-Administered Medications  Medication Dose Route Frequency Provider Last Rate Last Dose  . sodium chloride flush (NS) 0.9 % injection 10 mL  10 mL Intravenous PRN Gardenia Phlegm, NP   10 mL at 04/07/17 1232   Facility-Administered Medications Ordered in Other Visits  Medication Dose Route Frequency Provider Last Rate Last Dose  . sodium chloride flush (NS) 0.9 % injection 10 mL  10 mL Intravenous PRN Magrinat, [name], MD   10 mL at 03/10/17 1239    OBJECTIVE:   Vitals:   04/07/17 1130  BP: (!) 163/86  Pulse: (!) 104  Resp: 20  Temp: 97.8 F (36.6 C)  SpO2: 100%     Body mass index is 46.29 kg/m.   Wt Readings  from Last 3 Encounters:  04/07/17 286 lb 12.8 oz (130.1 kg)  03/31/17 284 lb 14.4 oz (129.2 kg)  03/24/17 285 lb 8 oz (129.5 kg)      ECOG FS:0 - Asymptomatic  GENERAL: Patient is a well appearing female in no acute distress HEENT:  Sclerae anicteric.  Oropharynx clear and moist. No ulcerations or evidence of oropharyngeal candidiasis. Neck is supple.  NODES:  No cervical, supraclavicular, or axillary lymphadenopathy palpated.  BREAST EXAM:  Deferred. LUNGS:  Clear to auscultation bilaterally.  No wheezes or rhonchi. HEART:  Regular rate and rhythm. No murmur appreciated. ABDOMEN:  Soft, nontender.  Positive, normoactive bowel sounds. No organomegaly palpated. MSK:  No focal spinal tenderness to palpation. Full range of motion bilaterally in the upper extremities. EXTREMITIES:  No peripheral edema.   SKIN:  Feet are malodorous, some skin cracking in between toes, and thickened toenails  Toenails are hyperpigmented. NEURO:  Nonfocal. Well oriented.  Appropriate affect.     LAB RESULTS:    Appointment on 04/07/2017  Component Date Value Ref Range Status  .  WBC 04/07/2017 6.5  3.9 - 10.3 10e3/uL Final  . NEUT# 04/07/2017 4.8  1.5 - 6.5 10e3/uL Final  . HGB 04/07/2017 9.6* 11.6 - 15.9 g/dL Final  . HCT 04/07/2017 29.2* 34.8 - 46.6 % Final  . Platelets 04/07/2017 206  145 - 400 10e3/uL Final  . MCV 04/07/2017 82.8  79.5 - 101.0 fL Final  . MCH 04/07/2017 27.1  25.1 - 34.0 pg Final  . MCHC 04/07/2017 32.7  31.5 - 36.0 g/dL Final  . RBC 04/07/2017 3.53* 3.70 - 5.45 10e6/uL Final  . RDW 04/07/2017 21.4* 11.2 - 14.5 % Final  . lymph# 04/07/2017 0.6* 0.9 - 3.3 10e3/uL Final  . MONO# 04/07/2017 1.1* 0.1 - 0.9 10e3/uL Final  . Eosinophils Absolute 04/07/2017 0.0  0.0 - 0.5 10e3/uL Final  . Basophils Absolute 04/07/2017 0.0  0.0 - 0.1 10e3/uL Final  . NEUT% 04/07/2017 74.2  38.4 - 76.8 % Final  . LYMPH% 04/07/2017 8.5* 14.0 - 49.7 % Final  . MONO% 04/07/2017 16.4* 0.0 - 14.0 % Final  . EOS% 04/07/2017 0.2  0.0 - 7.0 % Final  . BASO% 04/07/2017 0.7  0.0 - 2.0 % Final  . Sodium 04/07/2017 139  136 - 145 mEq/L Final  . Potassium 04/07/2017 3.8  3.5 - 5.1 mEq/L Final  . Chloride 04/07/2017 108  98 - 109 mEq/L Final  . CO2 04/07/2017 24  22 - 29 mEq/L Final  . Glucose 04/07/2017 130  70 - 140 mg/dl Final   Glucose reference range is for nonfasting patients. Fasting glucose reference range is 70- 100.  Marland Kitchen BUN 04/07/2017 13.0  7.0 - 26.0 mg/dL Final  . Creatinine 04/07/2017 0.8  0.6 - 1.1 mg/dL Final  . Total Bilirubin 04/07/2017 <0.22  0.20 - 1.20 mg/dL Final  . Alkaline Phosphatase 04/07/2017 108  40 - 150 U/L Final  . AST 04/07/2017 13  5 - 34 U/L Final  . ALT 04/07/2017 16  0 - 55 U/L Final  . Total Protein 04/07/2017 6.7  6.4 - 8.3 g/dL Final  . Albumin 04/07/2017 3.5  3.5 - 5.0 g/dL Final  . Calcium 04/07/2017 9.0  8.4 - 10.4 mg/dL Final  . Anion Gap 04/07/2017 7  3 - 11 mEq/L Final  . EGFR 04/07/2017 >60  >60 ml/min/1.73 m2 Final   eGFR is calculated using the CKD-EPI Creatinine Equation (2009)  STUDIES: MRI Lumbar Spine W Wo,  Feb 18, 2017, no abnormal findings to suggest metastatic disease. Mild lumbar spine spondylosis.   ELIGIBLE FOR AVAILABLE RESEARCH PROTOCOL: no  ASSESSMENT: 46 y.o. [place] woman status post left breast upper inner quadrant biopsy 01/14/2017 for a clinical T3 N2, stage IIA invasive ductal carcinoma, grade 1 or 2, estrogen and progesterone receptor positive, HER-2 nonamplified, with an MIB-1 of 20%.  (1) staging studies: Brain MRI, bone scan, and CT scan of the chest 02/05/2017 showed no brain lesions, no lung or liver lesions, a 4.9 cm mass in the left breast with left axillary and subpectoral adenopathy, and nonspecific bone scan tracer at L2, left scapula, and anterior ribs, with lumbar spine MRI suggested for further evaluation.  (2) neoadjuvant chemotherapy will consist of cyclophosphamide and doxorubicin in dose dense fashion 4 starting 02/10/2017, followed by weekly paclitaxel 12  (3) definitive surgery to follow, most likely left modified radical mastectomy  (4) postmastectomy radiation is indicated  (5) adjuvant anti-estrogens to follow  (6) genetics testing pending (drawn Feb 18, 2017)  PLAN: Vicy is doing moderately well today.  Due to her neuropathy, however, she will not receive treatment with Paclitaxel.  I reviewed this with Dr. Jana Hakim, and instead she will receive Gemcitabine and Carboplatin.  She will start this next week.  She and I reviewed it in detail, and I gave her info about it in her avs.  I recommended she get some OTC athlete's feet cream for her feet and apply as directed on the bottle.  She may need prescription strength, or podiatry referral, however we will see how this works first.    Larosa is in agreement with this plan, and she verbalizes understanding.  She will return in 1 week for labs and f/u with Dr. Jana Hakim.  She will hopefully start Gemcitabine/Carbo at that time.  A total of (30) minutes of face-to-face time was spent with this patient with  greater than 50% of that time in counseling and care-coordination.   [name], NP  04/07/17 2:06 PM Medical Oncology and Hematology [facility] [address] [place], [place] [address] Tel. [phone]    Fax. [phone]

## 2017-04-07 NOTE — Patient Instructions (Signed)
Implanted Port Home Guide An implanted port is a type of central line that is placed under the skin. Central lines are used to provide IV access when treatment or nutrition needs to be given through a person's veins. Implanted ports are used for long-term IV access. An implanted port may be placed because:  You need IV medicine that would be irritating to the small veins in your hands or arms.  You need long-term IV medicines, such as antibiotics.  You need IV nutrition for a long period.  You need frequent blood draws for lab tests.  You need dialysis.  Implanted ports are usually placed in the chest area, but they can also be placed in the upper arm, the abdomen, or the leg. An implanted port has two main parts:  Reservoir. The reservoir is round and will appear as a small, raised area under your skin. The reservoir is the part where a needle is inserted to give medicines or draw blood.  Catheter. The catheter is a thin, flexible tube that extends from the reservoir. The catheter is placed into a large vein. Medicine that is inserted into the reservoir goes into the catheter and then into the vein.  How will I care for my incision site? Do not get the incision site wet. Bathe or shower as directed by your health care provider. How is my port accessed? Special steps must be taken to access the port:  Before the port is accessed, a numbing cream can be placed on the skin. This helps numb the skin over the port site.  Your health care provider uses a sterile technique to access the port. ? Your health care provider must put on a mask and sterile gloves. ? The skin over your port is cleaned carefully with an antiseptic and allowed to dry. ? The port is gently pinched between sterile gloves, and a needle is inserted into the port.  Only "non-coring" port needles should be used to access the port. Once the port is accessed, a blood return should be checked. This helps ensure that the port  is in the vein and is not clogged.  If your port needs to remain accessed for a constant infusion, a clear (transparent) bandage will be placed over the needle site. The bandage and needle will need to be changed every week, or as directed by your health care provider.  Keep the bandage covering the needle clean and dry. Do not get it wet. Follow your health care provider's instructions on how to take a shower or bath while the port is accessed.  If your port does not need to stay accessed, no bandage is needed over the port.  What is flushing? Flushing helps keep the port from getting clogged. Follow your health care provider's instructions on how and when to flush the port. Ports are usually flushed with saline solution or a medicine called heparin. The need for flushing will depend on how the port is used.  If the port is used for intermittent medicines or blood draws, the port will need to be flushed: ? After medicines have been given. ? After blood has been drawn. ? As part of routine maintenance.  If a constant infusion is running, the port may not need to be flushed.  How long will my port stay implanted? The port can stay in for as long as your health care provider thinks it is needed. When it is time for the port to come out, surgery will be   done to remove it. The procedure is similar to the one performed when the port was put in. When should I seek immediate medical care? When you have an implanted port, you should seek immediate medical care if:  You notice a bad smell coming from the incision site.  You have swelling, redness, or drainage at the incision site.  You have more swelling or pain at the port site or the surrounding area.  You have a fever that is not controlled with medicine.  This information is not intended to replace advice given to you by your health care provider. Make sure you discuss any questions you have with your health care provider. Document  Released: 05/26/2005 Document Revised: 11/01/2015 Document Reviewed: 01/31/2013 Elsevier Interactive Patient Education  2017 [employer]  

## 2017-04-13 NOTE — Progress Notes (Signed)
[place]  [phone] [phone]) [phone]     ID: Mackenzie [name] DOB: July 31, 1970  MR#: [id number]  XIH#:[id number]  Patient Care Team: Willey Blade, MD as PCP - General (Internal Medicine) Magrinat, [name], MD as Consulting Physician (Oncology) Erroll Luna, MD as Consulting Physician (General Surgery) Kyung Rudd, MD as Consulting Physician (Radiation Oncology) OTHER MD:  CHIEF COMPLAINT: Estrogen receptor positive breast cancer  CURRENT TREATMENT: Neoadjuvant chemotherapy   HISTORY OF CURRENT ILLNESS: From the original intake note:  The patient herself noted some changes in her left breast late July 2018, she says, and eventually brought this to medical attention so that on 01/09/2017 she underwent bilateral diagnostic mammography with tomography and left breast ultrasonography at the breast Center. This found the breast density to be category C. In the upper inner quadrant of the left breast there was an area of asymmetry and there were malignant type calcifications involving all 4 quadrants. On exam there is firmness and palpable thickening in the anterior left breast with skin dimpling. Ultrasonography found at the 9:30 o'clock radiant 3 cm from the nipple a 2.7 cm mass and in the left axilla for abnormal lymph nodes largest of which measured 2.7 cm.  Biopsy of the left breast 9:30 o'clock mass 80 01/26/2017 showed ([id number]) invasive ductal carcinoma, with extracellular mucin, grade 1 or 2. In the lower outer left breast is separated biopsy the same day showed ductal carcinoma in situ. One of the 4 lymph nodes involved was positive for metastatic carcinoma. Prognostic panel on the invasive disease showed it to be estrogen receptor 100% positive, progesterone receptor 20% positive, both with strong staining intensity, with an MIB-1 of 20%, and no HER-2 amplification with a signals ratio 1.38 and the number per cell 2.90.  On 01/22/2017 the  patient underwent bilateral breast MRI. This showed no involvement of the right breast, but in the left breast there was a masslike and non-masslike enhancement involving all quadrants, with skin swelling but no abnormal enhancement of the skin or nipple areolar complex or pectoralis muscle. The mass could not be clearly measured but spanned approximately 12 cm. There was bulky left axillary lymphadenopathy, with the largest lymph node measuring up to 3.4 cm.  The patient's subsequent history is as detailed below.  INTERVAL HISTORY: Hend returns today for follow-up and treatment of her estrogen receptor positive breast cancer, accompanied by her husband. She is starting her carboplatin and gemcitabine treatments today.  We had originally planned to treat her with a taxing but we were not able because of the development of neuropathy, which actually may be related to the fungus she is experiencing in her toes.  The plan will be to repeat these weekly as tolerated until she completes 12 cycles. She takes compazine and decadron to aid with nausea following treatment.      REVIEW OF SYSTEMS: [name] reports she has been well overall. She notes that she has had a fungal infection to her bilateral finger nails and toes nails that she treats with Lamisil, a topical fungal creme. She also notes that her toes nails are very sensitive and painful. She does not currently exercise, although she recently participated in a 5k walk. She also went camping with her family recently. She denies unusual headaches, visual changes, nausea, vomiting, or dizziness. There has been no unusual cough, phlegm production, or pleurisy. This been no change in bowel or bladder habits. She denies unexplained fatigue or unexplained weight loss, bleeding, rash,  or fever. A detailed review of systems was otherwise stable.      PAST MEDICAL HISTORY: Past Medical History:  Diagnosis Date   Anemia    Breast cancer ([place]) 01/14/2017    Left breast   Diabetes mellitus    GDM    PAST SURGICAL HISTORY: Past Surgical History:  Procedure Laterality Date   CESAREAN SECTION     x2   TUBAL LIGATION      FAMILY HISTORY Family History  Problem Relation Age of Onset   Breast cancer Paternal Grandmother 70       d.60s from breast cancer. Did not have treatment.   Other Mother        B.02 from complications of surgery to remove brain tumor  The patient's father still alive at age 32. The patient's mother died with a brain tumor which the patient says was "benign". She was 56. The patient has one brother, no sisters. The only breast cancer in the family is a paternal grandmother who died from breast cancer at an unknown age. There is no history of ovarian or prostate cancer in the family.  GYNECOLOGIC HISTORY:  No LMP recorded. Menarche age 70 and first live birth age 62 she is [place] P4. The person is still having regular periods as of August 2018  SOCIAL HISTORY: (As of August 2018) Mackenzie [name] works at [employer] in the fifth floor [place]. Her husband Mackenzie [name] is a Building control surveyor. Daughter and married and lives in [place] and works as a Education administrator. Daughter Mackenzie [name] lives in [place] and works in the police department. Daughters [place] and [place] are 78 and 6, living at home. The patient has no grandchildren. She attends a Tour manager   ADVANCED DIRECTIVES: Not in place   HEALTH MAINTENANCE: Social History   Tobacco Use   Smoking status: Never Smoker   Smokeless tobacco: Never Used  Substance Use Topics   Alcohol use: Yes    Comment: social   Drug use: No     Colonoscopy: Never  PAP: December 2017  Bone density: Never   No Known Allergies  Current Outpatient Medications  Medication Sig Dispense Refill   B Complex Vitamins (VITAMIN B COMPLEX PO) Take 1 capsule by mouth daily.     benzonatate (TESSALON) 100 MG capsule Take 1 capsule by mouth every 8 (eight) hours as needed.  0    cetirizine (ZYRTEC) 10 MG tablet Take 10 mg by mouth daily.       CRANBERRY PO Take 1 tablet by mouth 2 (two) times daily.     HYDROcodone-homatropine (HYCODAN) 5-1.5 MG/5ML syrup Take 5 mLs by mouth every 6 (six) hours as needed.  0   loratadine (CLARITIN) 10 MG tablet Take 1 tablet (10 mg total) by mouth daily. 90 tablet 2   montelukast (SINGULAIR) 10 MG tablet Take 10 mg by mouth at bedtime. prn     Multiple Vitamin (MULTIVITAMIN) tablet Take 1 tablet by mouth daily.     nystatin (MYCOSTATIN) 100000 UNIT/ML suspension Take 5 mLs (500,000 Units total) by mouth 4 (four) times daily. 240 mL 0   Omega-3 Fatty Acids (FISH OIL) 1000 MG CAPS Take 1,000 mg by mouth 2 (two) times daily.      No current facility-administered medications for this visit.    Facility-Administered Medications Ordered in Other Visits  Medication Dose Route Frequency Provider Last Rate Last Dose   sodium chloride flush (NS) 0.9 % injection 10 mL  10 mL Intravenous PRN Magrinat, [name],  MD   10 mL at 03/10/17 1239    OBJECTIVE: Middle-aged African-American woman in no acute distress  Vitals:   04/14/17 1235  BP: (!) 155/81  Pulse: (!) 104  Resp: 18  Temp: 98.4 F (36.9 C)  SpO2: 99%     Body mass index is 46.15 kg/m.   Wt Readings from Last 3 Encounters:  04/14/17 285 lb 14.4 oz (129.7 kg)  04/07/17 286 lb 12.8 oz (130.1 kg)  03/31/17 284 lb 14.4 oz (129.2 kg)      ECOG FS:1 - Symptomatic but completely ambulatory   Sclerae unicteric, EOMs intact Oropharynx clear and moist No cervical or supraclavicular adenopathy Lungs no rales or rhonchi Heart regular rate and rhythm Abd soft, nontender, positive bowel sounds MSK no focal spinal tenderness, no upper extremity lymphedema Neuro: nonfocal, well oriented, appropriate affect Breasts: Deferred     LAB RESULTS:    Appointment on 04/14/2017  Component Date Value Ref Range Status   Sodium 04/14/2017 136  136 - 145 mEq/L Final    Potassium 04/14/2017 3.5  3.5 - 5.1 mEq/L Final   Chloride 04/14/2017 104  98 - 109 mEq/L Final   CO2 04/14/2017 25  22 - 29 mEq/L Final   Glucose 04/14/2017 154* 70 - 140 mg/dl Final   Glucose reference range is for nonfasting patients. Fasting glucose reference range is 70- 100.   BUN 04/14/2017 19.1  7.0 - 26.0 mg/dL Final   Creatinine 04/14/2017 0.8  0.6 - 1.1 mg/dL Final   Total Bilirubin 04/14/2017 <0.22  0.20 - 1.20 mg/dL Final   Alkaline Phosphatase 04/14/2017 94  40 - 150 U/L Final   AST 04/14/2017 19  5 - 34 U/L Final   ALT 04/14/2017 26  0 - 55 U/L Final   Total Protein 04/14/2017 7.2  6.4 - 8.3 g/dL Final   Albumin 04/14/2017 3.6  3.5 - 5.0 g/dL Final   Calcium 04/14/2017 9.5  8.4 - 10.4 mg/dL Final   Anion Gap 04/14/2017 7  3 - 11 mEq/L Final   EGFR 04/14/2017 >60  >60 ml/min/1.73 m2 Final   eGFR is calculated using the CKD-EPI Creatinine Equation (2009)   WBC 04/14/2017 6.0  3.9 - 10.3 10e3/uL Final   NEUT# 04/14/2017 3.8  1.5 - 6.5 10e3/uL Final   HGB 04/14/2017 10.3* 11.6 - 15.9 g/dL Final   HCT 04/14/2017 31.5* 34.8 - 46.6 % Final   Platelets 04/14/2017 302  145 - 400 10e3/uL Final   MCV 04/14/2017 82.5  79.5 - 101.0 fL Final   MCH 04/14/2017 27.0  25.1 - 34.0 pg Final   MCHC 04/14/2017 32.8  31.5 - 36.0 g/dL Final   RBC 04/14/2017 3.81  3.70 - 5.45 10e6/uL Final   RDW 04/14/2017 20.9* 11.2 - 14.5 % Final   lymph# 04/14/2017 0.9  0.9 - 3.3 10e3/uL Final   MONO# 04/14/2017 1.3* 0.1 - 0.9 10e3/uL Final   Eosinophils Absolute 04/14/2017 0.0  0.0 - 0.5 10e3/uL Final   Basophils Absolute 04/14/2017 0.1  0.0 - 0.1 10e3/uL Final   NEUT% 04/14/2017 62.8  38.4 - 76.8 % Final   LYMPH% 04/14/2017 14.2  14.0 - 49.7 % Final   MONO% 04/14/2017 21.5* 0.0 - 14.0 % Final   EOS% 04/14/2017 0.5  0.0 - 7.0 % Final   BASO% 04/14/2017 1.0  0.0 - 2.0 % Final       STUDIES: No results found.   ELIGIBLE FOR AVAILABLE RESEARCH PROTOCOL:  no  ASSESSMENT: 46  y.o. [place] woman status post left breast upper inner quadrant biopsy 01/14/2017 for a clinical T3 N2, stage IIA invasive ductal carcinoma, grade 1 or 2, estrogen and progesterone receptor positive, HER-2 nonamplified, with an MIB-1 of 20%.  (1) staging studies: Brain MRI, bone scan, and CT scan of the chest 02/05/2017 showed no brain lesions, no lung or liver lesions, a 4.9 cm mass in the left breast with left axillary and subpectoral adenopathy, and nonspecific bone scan tracer at L2, left scapula, and anterior ribs, with lumbar spine MRI suggested for further evaluation.  (2) neoadjuvant chemotherapy consisting of cyclophosphamide and doxorubicin in dose dense fashion 4 starting 02/10/2017, completed 03/24/2017, to be followed by weekly carboplatin and gemcitabine given days 1 and 8 of each 21-day cycle starting 04/14/2017  (3) definitive surgery to follow, most likely left modified radical mastectomy  (4) postmastectomy radiation is indicated  (5) adjuvant anti-estrogens to follow  (6) genetics testing pending (drawn 02/17/2017)  PLAN: Ulyssa is ready to start her new chemotherapy.  She has a good understanding of the possible toxicities, side effects and complications.  I have asked her to do the same thing she did for nausea for the earlier chemo, but if she does really well we will drop the dexamethasone first and half and then leave it out altogether depending on tolerance.  To facilitate that she will see me or my 26 assistant next week.  I do not know if they will seen will be able to be treated weekly in a row x12 or 2 weeks on and one-week off for 3 weeks on and one-week off.  Once we have the pattern we can add Neulasta as appropriate  She knows to call for any other issues that may develop before her next visit here.  Magrinat, [name], MD  04/14/17 1:10 PM Medical Oncology and Hematology [facility] [address], [place] [address] Tel. [phone]    Fax. [phone]  This document serves as a record of services personally performed by Lurline Del, MD. It was created on his behalf by Sheron Nightingale, a trained medical scribe. The creation of this record is based on the scribe's personal observations and the provider's statements to them.   I have reviewed the above documentation for accuracy and completeness, and I agree with the above.

## 2017-04-14 ENCOUNTER — Other Ambulatory Visit (HOSPITAL_BASED_OUTPATIENT_CLINIC_OR_DEPARTMENT_OTHER): Payer: 59

## 2017-04-14 ENCOUNTER — Ambulatory Visit: Payer: 59

## 2017-04-14 ENCOUNTER — Ambulatory Visit (HOSPITAL_BASED_OUTPATIENT_CLINIC_OR_DEPARTMENT_OTHER): Payer: 59

## 2017-04-14 ENCOUNTER — Ambulatory Visit (HOSPITAL_BASED_OUTPATIENT_CLINIC_OR_DEPARTMENT_OTHER): Payer: 59 | Admitting: Oncology

## 2017-04-14 VITALS — BP 155/81 | HR 104 | Temp 98.4°F | Resp 18 | Ht 66.0 in | Wt 285.9 lb

## 2017-04-14 VITALS — HR 89

## 2017-04-14 DIAGNOSIS — B351 Tinea unguium: Secondary | ICD-10-CM

## 2017-04-14 DIAGNOSIS — C773 Secondary and unspecified malignant neoplasm of axilla and upper limb lymph nodes: Secondary | ICD-10-CM

## 2017-04-14 DIAGNOSIS — C50912 Malignant neoplasm of unspecified site of left female breast: Secondary | ICD-10-CM

## 2017-04-14 DIAGNOSIS — C50812 Malignant neoplasm of overlapping sites of left female breast: Secondary | ICD-10-CM

## 2017-04-14 DIAGNOSIS — Z95828 Presence of other vascular implants and grafts: Secondary | ICD-10-CM

## 2017-04-14 DIAGNOSIS — Z170 Estrogen receptor positive status [ER+]: Principal | ICD-10-CM

## 2017-04-14 DIAGNOSIS — Z5111 Encounter for antineoplastic chemotherapy: Secondary | ICD-10-CM

## 2017-04-14 LAB — COMPREHENSIVE METABOLIC PANEL
ALBUMIN: 3.6 g/dL (ref 3.5–5.0)
ALK PHOS: 94 U/L (ref 40–150)
ALT: 26 U/L (ref 0–55)
AST: 19 U/L (ref 5–34)
Anion Gap: 7 mEq/L (ref 3–11)
BUN: 19.1 mg/dL (ref 7.0–26.0)
CALCIUM: 9.5 mg/dL (ref 8.4–10.4)
CO2: 25 mEq/L (ref 22–29)
CREATININE: 0.8 mg/dL (ref 0.6–1.1)
Chloride: 104 mEq/L (ref 98–109)
EGFR: >60 mL/min/{1.73_m2} (ref >60)
GLUCOSE: 154 mg/dL — AB (ref 70–140)
Potassium: 3.5 mEq/L (ref 3.5–5.1)
SODIUM: 136 meq/L (ref 136–145)
TOTAL PROTEIN: 7.2 g/dL (ref 6.4–8.3)

## 2017-04-14 LAB — CBC WITH DIFFERENTIAL/PLATELET
BASO%: 1 % (ref 0.0–2.0)
Basophils Absolute: 0.1 10*3/uL (ref 0.0–0.1)
EOS%: 0.5 % (ref 0.0–7.0)
Eosinophils Absolute: 0 10*3/uL (ref 0.0–0.5)
HCT: 31.5 % — ABNORMAL LOW (ref 34.8–46.6)
HGB: 10.3 g/dL — ABNORMAL LOW (ref 11.6–15.9)
LYMPH%: 14.2 % (ref 14.0–49.7)
MCH: 27 pg (ref 25.1–34.0)
MCHC: 32.8 g/dL (ref 31.5–36.0)
MCV: 82.5 fL (ref 79.5–101.0)
MONO#: 1.3 10*3/uL — AB (ref 0.1–0.9)
MONO%: 21.5 % — AB (ref 0.0–14.0)
NEUT#: 3.8 10*3/uL (ref 1.5–6.5)
NEUT%: 62.8 % (ref 38.4–76.8)
PLATELETS: 302 10*3/uL (ref 145–400)
RBC: 3.81 10*6/uL (ref 3.70–5.45)
RDW: 20.9 % — ABNORMAL HIGH (ref 11.2–14.5)
WBC: 6 10*3/uL (ref 3.9–10.3)
lymph#: 0.9 10*3/uL (ref 0.9–3.3)

## 2017-04-14 MED ORDER — HEPARIN SOD (PORK) LOCK FLUSH 100 UNIT/ML IV SOLN
500.0000 [IU] | Freq: Once | INTRAVENOUS | Status: AC | PRN
  Administered 2017-04-14: 500 [IU]
  Filled 2017-04-14: qty 5

## 2017-04-14 MED ORDER — PALONOSETRON HCL INJECTION 0.25 MG/5ML
0.2500 mg | Freq: Once | INTRAVENOUS | Status: AC
  Administered 2017-04-14: 0.25 mg via INTRAVENOUS

## 2017-04-14 MED ORDER — SODIUM CHLORIDE 0.9 % IV SOLN
2000.0000 mg | Freq: Once | INTRAVENOUS | Status: AC
  Administered 2017-04-14: 2000 mg via INTRAVENOUS
  Filled 2017-04-14: qty 52.6

## 2017-04-14 MED ORDER — SODIUM CHLORIDE 0.9 % IV SOLN
Freq: Once | INTRAVENOUS | Status: AC
  Administered 2017-04-14: 13:00:00 via INTRAVENOUS

## 2017-04-14 MED ORDER — PALONOSETRON HCL INJECTION 0.25 MG/5ML
INTRAVENOUS | Status: AC
  Filled 2017-04-14: qty 5

## 2017-04-14 MED ORDER — DEXAMETHASONE SODIUM PHOSPHATE 100 MG/10ML IJ SOLN: 10.0000 mg | Freq: Once | INTRAMUSCULAR | Status: DC

## 2017-04-14 MED ORDER — SODIUM CHLORIDE 0.9 % IV SOLN
300.0000 mg | Freq: Once | INTRAVENOUS | Status: AC
  Administered 2017-04-14: 300 mg via INTRAVENOUS
  Filled 2017-04-14: qty 30

## 2017-04-14 MED ORDER — SODIUM CHLORIDE 0.9% FLUSH
10.0000 mL | INTRAVENOUS | Status: DC | PRN
  Administered 2017-04-14: 10 mL
  Filled 2017-04-14: qty 10

## 2017-04-14 MED ORDER — DEXAMETHASONE SODIUM PHOSPHATE 10 MG/ML IJ SOLN
INTRAMUSCULAR | Status: AC
  Filled 2017-04-14: qty 1

## 2017-04-14 MED ORDER — DEXAMETHASONE SODIUM PHOSPHATE 10 MG/ML IJ SOLN
10.0000 mg | Freq: Once | INTRAMUSCULAR | Status: AC
  Administered 2017-04-14: 10 mg via INTRAVENOUS

## 2017-04-14 MED ORDER — SODIUM CHLORIDE 0.9% FLUSH
10.0000 mL | INTRAVENOUS | Status: DC | PRN
  Administered 2017-04-14: 10 mL via INTRAVENOUS
  Filled 2017-04-14: qty 10

## 2017-04-14 NOTE — Patient Instructions (Addendum)
[facility]ack[facility] [name] Discharge Instructions for Patients Receiving Chemotherapy  Today you received the following chemotherapy agents Carboplatin and Gemzar  To help prevent nausea and vomiting after your treatment, we encourage you to take your nausea medication as directed   If you develop nausea and vomiting that is not controlled by your nausea medication, call the clinic.   BELOW ARE SYMPTOMS THAT SHOULD BE REPORTED IMMEDIATELY:  *FEVER GREATER THAN 100.5 F  *CHILLS WITH OR WITHOUT FEVER  NAUSEA AND VOMITING THAT IS NOT CONTROLLED WITH YOUR NAUSEA MEDICATION  *UNUSUAL SHORTNESS OF BREATH  *UNUSUAL BRUISING OR BLEEDING  TENDERNESS IN MOUTH AND THROAT WITH OR WITHOUT PRESENCE OF ULCERS  *URINARY PROBLEMS  *BOWEL PROBLEMS  UNUSUAL RASH Items with * indicate a potential emergency and should be followed up as soon as possible.  Feel free to call the clinic should you have any questions or concerns. The clinic phone number is [phone].  Please show the [facility] at check-in to the Emergency Department and triage nurse.    Gemcitabine injection What is this medicine? GEMCITABINE (jem SIT a been) is a chemotherapy drug. This medicine is used to treat many types of cancer like breast cancer, lung cancer, pancreatic cancer, and ovarian cancer. This medicine may be used for other purposes; ask your health care provider or pharmacist if you have questions. COMMON BRAND NAME(S): Gemzar What should I tell my health care provider before I take this medicine? They need to know if you have any of these conditions: -blood disorders -infection -kidney disease -liver disease -recent or ongoing radiation therapy -an unusual or allergic reaction to gemcitabine, other chemotherapy, other medicines, foods, dyes, or preservatives -pregnant or trying to get pregnant -breast-feeding How should I use this medicine? This drug is given as an infusion into a vein. It is  administered in a hospital or clinic by a specially trained health care professional. Talk to your pediatrician regarding the use of this medicine in children. Special care may be needed. Overdosage: If you think you have taken too much of this medicine contact a poison control center or emergency room at once. NOTE: This medicine is only for you. Do not share this medicine with others. What if I miss a dose? It is important not to miss your dose. Call your doctor or health care professional if you are unable to keep an appointment. What may interact with this medicine? -medicines to increase blood counts like filgrastim, pegfilgrastim, sargramostim -some other chemotherapy drugs like cisplatin -vaccines Talk to your doctor or health care professional before taking any of these medicines: -acetaminophen -aspirin -ibuprofen -ketoprofen -naproxen This list may not describe all possible interactions. Give your health care provider a list of all the medicines, herbs, non-prescription drugs, or dietary supplements you use. Also tell them if you smoke, drink alcohol, or use illegal drugs. Some items may interact with your medicine. What should I watch for while using this medicine? Visit your doctor for checks on your progress. This drug may make you feel generally unwell. This is not uncommon, as chemotherapy can affect healthy cells as well as cancer cells. Report any side effects. Continue your course of treatment even though you feel ill unless your doctor tells you to stop. In some cases, you may be given additional medicines to help with side effects. Follow all directions for their use. Call your doctor or health care professional for advice if you get a fever, chills or sore throat, or other symptoms  of a cold or flu. Do not treat yourself. This drug decreases your body's ability to fight infections. Try to avoid being around people who are sick. This medicine may increase your risk to bruise  or bleed. Call your doctor or health care professional if you notice any unusual bleeding. Be careful brushing and flossing your teeth or using a toothpick because you may get an infection or bleed more easily. If you have any dental work done, tell your dentist you are receiving this medicine. Avoid taking products that contain aspirin, acetaminophen, ibuprofen, naproxen, or ketoprofen unless instructed by your doctor. These medicines may hide a fever. Women should inform their doctor if they wish to become pregnant or think they might be pregnant. There is a potential for serious side effects to an unborn child. Talk to your health care professional or pharmacist for more information. Do not breast-feed an infant while taking this medicine. What side effects may I notice from receiving this medicine? Side effects that you should report to your doctor or health care professional as soon as possible: -allergic reactions like skin rash, itching or hives, swelling of the face, lips, or tongue -low blood counts - this medicine may decrease the number of white blood cells, red blood cells and platelets. You may be at increased risk for infections and bleeding. -signs of infection - fever or chills, cough, sore throat, pain or difficulty passing urine -signs of decreased platelets or bleeding - bruising, pinpoint red spots on the skin, black, tarry stools, blood in the urine -signs of decreased red blood cells - unusually weak or tired, fainting spells, lightheadedness -breathing problems -chest pain -mouth sores -nausea and vomiting -pain, swelling, redness at site where injected -pain, tingling, numbness in the hands or feet -stomach pain -swelling of ankles, feet, hands -unusual bleeding Side effects that usually do not require medical attention (report to your doctor or health care professional if they continue or are bothersome): -constipation -diarrhea -hair loss -loss of appetite -stomach  upset This list may not describe all possible side effects. Call your doctor for medical advice about side effects. You may report side effects to FDA at 1-800-FDA-1088. Where should I keep my medicine? This drug is given in a hospital or clinic and will not be stored at home. NOTE: This sheet is a summary. It may not cover all possible information. If you have questions about this medicine, talk to your doctor, pharmacist, or health care provider.  2018 Elsevier/Gold Standard (2007-10-05 18:45:54)    Carboplatin injection What is this medicine? CARBOPLATIN (KAR boe pla tin) is a chemotherapy drug. It targets fast dividing cells, like cancer cells, and causes these cells to die. This medicine is used to treat ovarian cancer and many other cancers. This medicine may be used for other purposes; ask your health care provider or pharmacist if you have questions. COMMON BRAND NAME(S): Paraplatin What should I tell my health care provider before I take this medicine? They need to know if you have any of these conditions: -blood disorders -hearing problems -kidney disease -recent or ongoing radiation therapy -an unusual or allergic reaction to carboplatin, cisplatin, other chemotherapy, other medicines, foods, dyes, or preservatives -pregnant or trying to get pregnant -breast-feeding How should I use this medicine? This drug is usually given as an infusion into a vein. It is administered in a hospital or clinic by a specially trained health care professional. Talk to your pediatrician regarding the use of this medicine in children. Special care  may be needed. Overdosage: If you think you have taken too much of this medicine contact a poison control center or emergency room at once. NOTE: This medicine is only for you. Do not share this medicine with others. What if I miss a dose? It is important not to miss a dose. Call your doctor or health care professional if you are unable to keep an  appointment. What may interact with this medicine? -medicines for seizures -medicines to increase blood counts like filgrastim, pegfilgrastim, sargramostim -some antibiotics like amikacin, gentamicin, neomycin, streptomycin, tobramycin -vaccines Talk to your doctor or health care professional before taking any of these medicines: -acetaminophen -aspirin -ibuprofen -ketoprofen -naproxen This list may not describe all possible interactions. Give your health care provider a list of all the medicines, herbs, non-prescription drugs, or dietary supplements you use. Also tell them if you smoke, drink alcohol, or use illegal drugs. Some items may interact with your medicine. What should I watch for while using this medicine? Your condition will be monitored carefully while you are receiving this medicine. You will need important blood work done while you are taking this medicine. This drug may make you feel generally unwell. This is not uncommon, as chemotherapy can affect healthy cells as well as cancer cells. Report any side effects. Continue your course of treatment even though you feel ill unless your doctor tells you to stop. In some cases, you may be given additional medicines to help with side effects. Follow all directions for their use. Call your doctor or health care professional for advice if you get a fever, chills or sore throat, or other symptoms of a cold or flu. Do not treat yourself. This drug decreases your body's ability to fight infections. Try to avoid being around people who are sick. This medicine may increase your risk to bruise or bleed. Call your doctor or health care professional if you notice any unusual bleeding. Be careful brushing and flossing your teeth or using a toothpick because you may get an infection or bleed more easily. If you have any dental work done, tell your dentist you are receiving this medicine. Avoid taking products that contain aspirin, acetaminophen,  ibuprofen, naproxen, or ketoprofen unless instructed by your doctor. These medicines may hide a fever. Do not become pregnant while taking this medicine. Women should inform their doctor if they wish to become pregnant or think they might be pregnant. There is a potential for serious side effects to an unborn child. Talk to your health care professional or pharmacist for more information. Do not breast-feed an infant while taking this medicine. What side effects may I notice from receiving this medicine? Side effects that you should report to your doctor or health care professional as soon as possible: -allergic reactions like skin rash, itching or hives, swelling of the face, lips, or tongue -signs of infection - fever or chills, cough, sore throat, pain or difficulty passing urine -signs of decreased platelets or bleeding - bruising, pinpoint red spots on the skin, black, tarry stools, nosebleeds -signs of decreased red blood cells - unusually weak or tired, fainting spells, lightheadedness -breathing problems -changes in hearing -changes in vision -chest pain -high blood pressure -low blood counts - This drug may decrease the number of white blood cells, red blood cells and platelets. You may be at increased risk for infections and bleeding. -nausea and vomiting -pain, swelling, redness or irritation at the injection site -pain, tingling, numbness in the hands or feet -problems with balance,  talking, walking -trouble passing urine or change in the amount of urine Side effects that usually do not require medical attention (report to your doctor or health care professional if they continue or are bothersome): -hair loss -loss of appetite -metallic taste in the mouth or changes in taste This list may not describe all possible side effects. Call your doctor for medical advice about side effects. You may report side effects to FDA at 1-800-FDA-1088. Where should I keep my medicine? This drug  is given in a hospital or clinic and will not be stored at home. NOTE: This sheet is a summary. It may not cover all possible information. If you have questions about this medicine, talk to your doctor, pharmacist, or health care provider.  2018 Elsevier/Gold Standard (2007-08-31 14:38:05)

## 2017-04-16 ENCOUNTER — Encounter: Payer: Self-pay | Admitting: Genetics

## 2017-04-16 ENCOUNTER — Telehealth: Payer: Self-pay | Admitting: *Deleted

## 2017-04-16 NOTE — Telephone Encounter (Addendum)
-----   Message from Gershon Mussel, RN sent at 04/14/2017  3:11 PM EST ----- Regarding: Dr Jana Hakim First chemo F/U Please call pt to f/u. She received her first dose of carboplatin and gemcitabine on 04/14/17. Thank you!  This RN spoke with pt per follow up chemo call - overall pt states good tolerance with only some mild nausea relieved with medication.  Her only concern is related to noted changes in nails including fungus ( assessed by provider previously ) " is this normal " - Nieve is using antifungal cream as recommended.  Per above- this RN discussed soaking nails ( or placing a soaked cloth over her toenails ) of 1 cup Listerine to 1 pump of liquid Dial Gold soap twice a day for benefit.  Mammie verbalized understanding as well as to call if she has further concerns. Appointments for next week reviewed with pt.

## 2017-04-21 ENCOUNTER — Other Ambulatory Visit (HOSPITAL_BASED_OUTPATIENT_CLINIC_OR_DEPARTMENT_OTHER): Payer: 59

## 2017-04-21 ENCOUNTER — Ambulatory Visit: Payer: 59

## 2017-04-21 ENCOUNTER — Ambulatory Visit (HOSPITAL_BASED_OUTPATIENT_CLINIC_OR_DEPARTMENT_OTHER): Payer: 59

## 2017-04-21 ENCOUNTER — Encounter: Payer: Self-pay | Admitting: Adult Health

## 2017-04-21 ENCOUNTER — Telehealth: Payer: Self-pay | Admitting: Adult Health

## 2017-04-21 ENCOUNTER — Ambulatory Visit (HOSPITAL_BASED_OUTPATIENT_CLINIC_OR_DEPARTMENT_OTHER): Payer: 59 | Admitting: Adult Health

## 2017-04-21 VITALS — BP 137/67 | HR 91 | Temp 98.2°F | Resp 20 | Ht 66.0 in | Wt 288.9 lb

## 2017-04-21 DIAGNOSIS — C50912 Malignant neoplasm of unspecified site of left female breast: Secondary | ICD-10-CM

## 2017-04-21 DIAGNOSIS — Z5111 Encounter for antineoplastic chemotherapy: Secondary | ICD-10-CM | POA: Diagnosis not present

## 2017-04-21 DIAGNOSIS — R110 Nausea: Secondary | ICD-10-CM | POA: Diagnosis not present

## 2017-04-21 DIAGNOSIS — C50812 Malignant neoplasm of overlapping sites of left female breast: Secondary | ICD-10-CM | POA: Diagnosis not present

## 2017-04-21 DIAGNOSIS — Z170 Estrogen receptor positive status [ER+]: Secondary | ICD-10-CM | POA: Diagnosis not present

## 2017-04-21 DIAGNOSIS — C773 Secondary and unspecified malignant neoplasm of axilla and upper limb lymph nodes: Secondary | ICD-10-CM | POA: Diagnosis not present

## 2017-04-21 DIAGNOSIS — Z95828 Presence of other vascular implants and grafts: Secondary | ICD-10-CM

## 2017-04-21 DIAGNOSIS — B351 Tinea unguium: Secondary | ICD-10-CM

## 2017-04-21 LAB — COMPREHENSIVE METABOLIC PANEL
ALBUMIN: 3.5 g/dL (ref 3.5–5.0)
ALT: 25 U/L (ref 0–55)
AST: 14 U/L (ref 5–34)
Alkaline Phosphatase: 99 U/L (ref 40–150)
Anion Gap: 7 mEq/L (ref 3–11)
BUN: 16.8 mg/dL (ref 7.0–26.0)
CO2: 25 meq/L (ref 22–29)
CREATININE: 0.8 mg/dL (ref 0.6–1.1)
Calcium: 9.2 mg/dL (ref 8.4–10.4)
Chloride: 104 mEq/L (ref 98–109)
EGFR: >60 mL/min/{1.73_m2} (ref >60)
GLUCOSE: 140 mg/dL (ref 70–140)
Potassium: 3.8 mEq/L (ref 3.5–5.1)
SODIUM: 135 meq/L — AB (ref 136–145)
TOTAL PROTEIN: 6.9 g/dL (ref 6.4–8.3)

## 2017-04-21 LAB — CBC WITH DIFFERENTIAL/PLATELET
BASO%: 0.7 % (ref 0.0–2.0)
BASOS ABS: 0 10*3/uL (ref 0.0–0.1)
EOS%: 1.2 % (ref 0.0–7.0)
Eosinophils Absolute: 0.1 10*3/uL (ref 0.0–0.5)
HEMATOCRIT: 29.3 % — AB (ref 34.8–46.6)
HGB: 9.5 g/dL — ABNORMAL LOW (ref 11.6–15.9)
LYMPH%: 19.2 % (ref 14.0–49.7)
MCH: 27.2 pg (ref 25.1–34.0)
MCHC: 32.4 g/dL (ref 31.5–36.0)
MCV: 84 fL (ref 79.5–101.0)
MONO#: 0.5 10*3/uL (ref 0.1–0.9)
MONO%: 11.9 % (ref 0.0–14.0)
NEUT#: 2.8 10*3/uL (ref 1.5–6.5)
NEUT%: 67 % (ref 38.4–76.8)
Platelets: 192 10*3/uL (ref 145–400)
RBC: 3.49 10*6/uL — AB (ref 3.70–5.45)
RDW: 18.3 % — ABNORMAL HIGH (ref 11.2–14.5)
WBC: 4.1 10*3/uL (ref 3.9–10.3)
lymph#: 0.8 10*3/uL — ABNORMAL LOW (ref 0.9–3.3)

## 2017-04-21 MED ORDER — SODIUM CHLORIDE 0.9% FLUSH
10.0000 mL | INTRAVENOUS | Status: DC | PRN
  Administered 2017-04-21: 10 mL via INTRAVENOUS
  Filled 2017-04-21: qty 10

## 2017-04-21 MED ORDER — TERBINAFINE HCL 250 MG PO TABS: 250.0000 mg | ORAL_TABLET | Freq: Every day | ORAL | 0 refills | Status: DC

## 2017-04-21 MED ORDER — HEPARIN SOD (PORK) LOCK FLUSH 100 UNIT/ML IV SOLN
500.0000 [IU] | Freq: Once | INTRAVENOUS | Status: AC | PRN
  Administered 2017-04-21: 500 [IU]
  Filled 2017-04-21: qty 5

## 2017-04-21 MED ORDER — PALONOSETRON HCL INJECTION 0.25 MG/5ML
INTRAVENOUS | Status: AC
  Filled 2017-04-21: qty 5

## 2017-04-21 MED ORDER — SODIUM CHLORIDE 0.9 % IV SOLN
300.0000 mg | Freq: Once | INTRAVENOUS | Status: AC
  Administered 2017-04-21: 300 mg via INTRAVENOUS
  Filled 2017-04-21: qty 30

## 2017-04-21 MED ORDER — SODIUM CHLORIDE 0.9% FLUSH
10.0000 mL | INTRAVENOUS | Status: DC | PRN
  Administered 2017-04-21: 10 mL
  Filled 2017-04-21: qty 10

## 2017-04-21 MED ORDER — SODIUM CHLORIDE 0.9 % IV SOLN
Freq: Once | INTRAVENOUS | Status: AC
  Administered 2017-04-21: 15:00:00 via INTRAVENOUS

## 2017-04-21 MED ORDER — SODIUM CHLORIDE 0.9 % IV SOLN: 10.0000 mg | Freq: Once | INTRAVENOUS | Status: DC

## 2017-04-21 MED ORDER — PALONOSETRON HCL INJECTION 0.25 MG/5ML
0.2500 mg | Freq: Once | INTRAVENOUS | Status: AC
  Administered 2017-04-21: 0.25 mg via INTRAVENOUS

## 2017-04-21 MED ORDER — SODIUM CHLORIDE 0.9 % IV SOLN
2000.0000 mg | Freq: Once | INTRAVENOUS | Status: AC
  Administered 2017-04-21: 2000 mg via INTRAVENOUS
  Filled 2017-04-21: qty 53

## 2017-04-21 MED ORDER — DEXAMETHASONE SODIUM PHOSPHATE 10 MG/ML IJ SOLN
10.0000 mg | Freq: Once | INTRAMUSCULAR | Status: AC
  Administered 2017-04-21: 10 mg via INTRAVENOUS

## 2017-04-21 MED ORDER — DEXAMETHASONE SODIUM PHOSPHATE 10 MG/ML IJ SOLN
INTRAMUSCULAR | Status: AC
  Filled 2017-04-21: qty 1

## 2017-04-21 MED ORDER — ONDANSETRON HCL 8 MG PO TABS: 8.0000 mg | ORAL_TABLET | Freq: Three times a day (TID) | ORAL | 0 refills | Status: DC | PRN

## 2017-04-21 MED FILL — TERBINAFINE HCL 250 MG TABS: 250 | 90 days supply | Qty: 90 | Fill #0

## 2017-04-21 MED FILL — ONDANSETRON HCL 8 MG TABLET: 8 | 7 days supply | Qty: 20 | Fill #0

## 2017-04-21 NOTE — Patient Instructions (Signed)
[name] Discharge Instructions for Patients Receiving Chemotherapy  Today you received the following chemotherapy agents Carboplatin and Gemzar  To help prevent nausea and vomiting after your treatment, we encourage you to take your nausea medication as directed.  If you develop nausea and vomiting that is not controlled by your nausea medication, call the clinic.   BELOW ARE SYMPTOMS THAT SHOULD BE REPORTED IMMEDIATELY:  *FEVER GREATER THAN 100.5 F  *CHILLS WITH OR WITHOUT FEVER  NAUSEA AND VOMITING THAT IS NOT CONTROLLED WITH YOUR NAUSEA MEDICATION  *UNUSUAL SHORTNESS OF BREATH  *UNUSUAL BRUISING OR BLEEDING  TENDERNESS IN MOUTH AND THROAT WITH OR WITHOUT PRESENCE OF ULCERS  *URINARY PROBLEMS  *BOWEL PROBLEMS  UNUSUAL RASH Items with * indicate a potential emergency and should be followed up as soon as possible.  Feel free to call the clinic should you have any questions or concerns. The clinic phone number is [phone].  Please show the CHEMO ALERT CARD at check-in to the Emergency Department and triage nurse.   

## 2017-04-21 NOTE — Progress Notes (Signed)
[facility]  [phone] [phone]) [phone]     ID: Mackenzie [name] DOB: July 25, 1970  MR#: [id number]  IWL#:[id number]  Patient Care Team: Mackenzie Blade, MD as PCP - General (Internal Medicine) Magrinat, Mackenzie Dad, MD as Consulting Physician (Oncology) Mackenzie Luna, MD as Consulting Physician (General Surgery) Mackenzie Rudd, MD as Consulting Physician (Radiation Oncology) OTHER MD:  CHIEF COMPLAINT: Estrogen receptor positive breast cancer  CURRENT TREATMENT: Neoadjuvant chemotherapy   HISTORY OF CURRENT ILLNESS: From the original intake note:  The patient herself noted some changes in her left breast late July 2018, she says, and eventually brought this to medical attention so that on 01/09/2017 she underwent bilateral diagnostic mammography with tomography and left breast ultrasonography at the breast Center. This found the breast density to be category C. In the upper inner quadrant of the left breast there was an area of asymmetry and there were malignant type calcifications involving all 4 quadrants. On exam there is firmness and palpable thickening in the anterior left breast with skin dimpling. Ultrasonography found at the 9:30 o'clock radiant 3 cm from the nipple a 2.7 cm mass and in the left axilla for abnormal lymph nodes largest of which measured 2.7 cm.  Biopsy of the left breast 9:30 o'clock mass 80 01/26/2017 showed ([id number]) invasive ductal carcinoma, with extracellular mucin, grade 1 or 2. In the lower outer left breast is separated biopsy the same day showed ductal carcinoma in situ. One of the 4 lymph nodes involved was positive for metastatic carcinoma. Prognostic panel on the invasive disease showed it to be estrogen receptor 100% positive, progesterone receptor 20% positive, both with strong staining intensity, with an MIB-1 of 20%, and no HER-2 amplification with a signals ratio 1.38 and the number per cell 2.90.  On 01/22/2017 the  patient underwent bilateral breast MRI. This showed no involvement of the right breast, but in the left breast there was a masslike and non-masslike enhancement involving all quadrants, with skin swelling but no abnormal enhancement of the skin or nipple areolar complex or pectoralis muscle. The mass could not be clearly measured but spanned approximately 12 cm. There was bulky left axillary lymphadenopathy, with the largest lymph node measuring up to 3.4 cm.  The patient's subsequent history is as detailed below.  INTERVAL HISTORY: [name] returns today for follow-up and treatment of her estrogen receptor positive breast cancer.  She is currently receiving Gemcitabine and Carboplatin given on days 1 and 8 of a 21 day cycle.  She received her first cycle last week.  She tolerated it for the most part well.      REVIEW OF SYSTEMS: [name] has noted a mild increase in her nausea with the Gemcitabine/Carboplatin.  She is taking Compazine.  She does still have Dexamethasone, and did take it some throughout the past week.  She is requesting a tablet to take daily for her toenail fungus. She is otherwise doing well and without any questions or concerns today.       PAST MEDICAL HISTORY: Past Medical History:  Diagnosis Date  . Anemia   . Breast cancer ([place]) 01/14/2017   Left breast  . Diabetes mellitus    GDM    PAST SURGICAL HISTORY: Past Surgical History:  Procedure Laterality Date  . CESAREAN SECTION     x2  . TUBAL LIGATION      FAMILY HISTORY Family History  Problem Relation Age of Onset  . Breast cancer Paternal Grandmother 34  d.60s from breast cancer. Did not have treatment.  . Other Mother        M.08 from complications of surgery to remove brain tumor  The patient's father still alive at age 29. The patient's mother died with a brain tumor which the patient says was "benign". She was 56. The patient has one brother, no sisters. The only breast cancer in the family is a  paternal grandmother who died from breast cancer at an unknown age. There is no history of ovarian or prostate cancer in the family.  GYNECOLOGIC HISTORY:  No LMP recorded. Menarche age 12 and first live birth age 63 she is [place] P4. The person is still having regular periods as of August 2018  SOCIAL HISTORY: (As of August 2018) Mackenzie [name] works at [employer] in the fifth floor [place]. Her husband [name] is a Building control surveyor. Daughter and married and lives in [place] and works as a Education administrator. Daughter [name] lives in [place] and works in the police department. Daughters Clarkfield and Leshara are 28 and 6, living at home. The patient has no grandchildren. She attends a Tour manager   ADVANCED DIRECTIVES: Not in place   HEALTH MAINTENANCE: Social History   Tobacco Use  . Smoking status: Never Smoker  . Smokeless tobacco: Never Used  Substance Use Topics  . Alcohol use: Yes    Comment: social  . Drug use: No     Colonoscopy: Never  PAP: December 2017  Bone density: Never   No Known Allergies  Current Outpatient Medications  Medication Sig Dispense Refill  . B Complex Vitamins (VITAMIN B COMPLEX PO) Take 1 capsule by mouth daily.    . benzonatate (TESSALON) 100 MG capsule Take 1 capsule by mouth every 8 (eight) hours as needed.  0  . cetirizine (ZYRTEC) 10 MG tablet Take 10 mg by mouth daily.      . Cholecalciferol (VITAMIN D PO) Take daily by mouth.    . CRANBERRY PO Take 1 tablet by mouth 2 (two) times daily.    Marland Kitchen dexamethasone (DECADRON) 4 MG tablet Take by mouth.  1  . HYDROcodone-homatropine (HYCODAN) 5-1.5 MG/5ML syrup Take 5 mLs by mouth every 6 (six) hours as needed.  0  . loratadine (CLARITIN) 10 MG tablet Take 1 tablet (10 mg total) by mouth daily. 90 tablet 2  . LORazepam (ATIVAN) 0.5 MG tablet Take by mouth.  0  . montelukast (SINGULAIR) 10 MG tablet Take 10 mg by mouth at bedtime. prn    . Multiple Vitamin (MULTIVITAMIN) tablet Take 1 tablet by mouth  daily.    Marland Kitchen nystatin (MYCOSTATIN) 100000 UNIT/ML suspension Take 5 mLs (500,000 Units total) by mouth 4 (four) times daily. 240 mL 0  . Omega-3 Fatty Acids (FISH OIL) 1000 MG CAPS Take 1,000 mg by mouth 2 (two) times daily.     . prochlorperazine (COMPAZINE) 10 MG tablet Take by mouth.  1   No current facility-administered medications for this visit.    Facility-Administered Medications Ordered in Other Visits  Medication Dose Route Frequency Provider Last Rate Last Dose  . sodium chloride flush (NS) 0.9 % injection 10 mL  10 mL Intravenous PRN Magrinat, Mackenzie Dad, MD   10 mL at 03/10/17 1239    OBJECTIVE:  Vitals:   04/21/17 1321  BP: 137/67  Pulse: 91  Resp: 20  Temp: 98.2 F (36.8 C)  SpO2: 100%     Body mass index is 46.63 kg/m.   Wt Readings from Last  3 Encounters:  04/21/17 288 lb 14.4 oz (131 kg)  04/14/17 285 lb 14.4 oz (129.7 kg)  04/07/17 286 lb 12.8 oz (130.1 kg)      ECOG FS:1 - Symptomatic but completely ambulatory  GENERAL: Patient is a well appearing female in no acute distress HEENT:  Sclerae anicteric.  Oropharynx clear and moist. No ulcerations or evidence of oropharyngeal candidiasis. Neck is supple.  NODES:  No cervical, supraclavicular, or axillary lymphadenopathy palpated.  BREAST EXAM:  Deferred. LUNGS:  Clear to auscultation bilaterally.  No wheezes or rhonchi. HEART:  Regular rate and rhythm. No murmur appreciated. ABDOMEN:  Soft, nontender.  Positive, normoactive bowel sounds. No organomegaly palpated. MSK:  No focal spinal tenderness to palpation. Full range of motion bilaterally in the upper extremities. EXTREMITIES:  No peripheral edema.   SKIN:  Clear with no obvious rashes or skin changes. No nail dyscrasia. NEURO:  Nonfocal. Well oriented.  Appropriate affect.      LAB RESULTS:    Appointment on 04/21/2017  Component Date Value Ref Range Status  . WBC 04/21/2017 4.1  3.9 - 10.3 10e3/uL Final  . NEUT# 04/21/2017 2.8  1.5 - 6.5  10e3/uL Final  . HGB 04/21/2017 9.5* 11.6 - 15.9 g/dL Final  . HCT 04/21/2017 29.3* 34.8 - 46.6 % Final  . Platelets 04/21/2017 192  145 - 400 10e3/uL Final  . MCV 04/21/2017 84.0  79.5 - 101.0 fL Final  . MCH 04/21/2017 27.2  25.1 - 34.0 pg Final  . MCHC 04/21/2017 32.4  31.5 - 36.0 g/dL Final  . RBC 04/21/2017 3.49* 3.70 - 5.45 10e6/uL Final  . RDW 04/21/2017 18.3* 11.2 - 14.5 % Final  . lymph# 04/21/2017 0.8* 0.9 - 3.3 10e3/uL Final  . MONO# 04/21/2017 0.5  0.1 - 0.9 10e3/uL Final  . Eosinophils Absolute 04/21/2017 0.1  0.0 - 0.5 10e3/uL Final  . Basophils Absolute 04/21/2017 0.0  0.0 - 0.1 10e3/uL Final  . NEUT% 04/21/2017 67.0  38.4 - 76.8 % Final  . LYMPH% 04/21/2017 19.2  14.0 - 49.7 % Final  . MONO% 04/21/2017 11.9  0.0 - 14.0 % Final  . EOS% 04/21/2017 1.2  0.0 - 7.0 % Final  . BASO% 04/21/2017 0.7  0.0 - 2.0 % Final       STUDIES: No results found.   ELIGIBLE FOR AVAILABLE RESEARCH PROTOCOL: no  ASSESSMENT: 46 y.o. [place] woman status post left breast upper inner quadrant biopsy 01/14/2017 for a clinical T3 N2, stage IIA invasive ductal carcinoma, grade 1 or 2, estrogen and progesterone receptor positive, HER-2 nonamplified, with an MIB-1 of 20%.  (1) staging studies: Brain MRI, bone scan, and CT scan of the chest 02/05/2017 showed no brain lesions, no lung or liver lesions, a 4.9 cm mass in the left breast with left axillary and subpectoral adenopathy, and nonspecific bone scan tracer at L2, left scapula, and anterior ribs, with lumbar spine MRI suggested for further evaluation.  (2) neoadjuvant chemotherapy consisting of cyclophosphamide and doxorubicin in dose dense fashion 4 starting 02/10/2017, completed 03/24/2017, to be followed by weekly carboplatin and gemcitabine given days 1 and 8 of each 21-day cycle starting 04/14/2017  (3) definitive surgery to follow, most likely left modified radical mastectomy  (4) postmastectomy radiation is indicated  (5)  adjuvant anti-estrogens to follow  (6) genetics testing pending (drawn 02/17/2017)  PLAN:  Mirakle will proceed with chemotherapy today.  I reviewed her labs with her which are normal.  I suggested she take the Dexamethasone  following chemotherapy, similarly to how she took it following Doxorubicin/Cyclophosphamide.  I also sent in a prescription of Zofran to her pharmacy to start the third day following chemotherapy.  I sent in Terbinafine for her toenail fungus.  This is a 3 month prescription, and I counseled her that it will take months to grow out.  She verbalized understanding.    Londa will return in 2 weeks for labs, follow up and her next cycle of chemotherapy.  She knows to call for any questions or concerns prior to her next appointment with [name].    A total of (30) minutes of face-to-face time was spent with this patient with greater than 50% of that time in counseling and care-coordination.   [name], NP 04/21/17 1:27 PM Medical Oncology and Hematology [facility] [address] [place], [place] [address] Tel. [phone]    Fax. [phone]

## 2017-04-21 NOTE — Telephone Encounter (Signed)
Scheduled appt per 11/13 los - patient will get updated schedule in infusion.

## 2017-04-24 ENCOUNTER — Other Ambulatory Visit: Payer: Self-pay

## 2017-04-24 ENCOUNTER — Other Ambulatory Visit: Payer: Self-pay | Admitting: Oncology

## 2017-04-24 ENCOUNTER — Telehealth: Payer: Self-pay

## 2017-04-24 DIAGNOSIS — Z170 Estrogen receptor positive status [ER+]: Principal | ICD-10-CM

## 2017-04-24 DIAGNOSIS — C773 Secondary and unspecified malignant neoplasm of axilla and upper limb lymph nodes: Secondary | ICD-10-CM

## 2017-04-24 DIAGNOSIS — C50812 Malignant neoplasm of overlapping sites of left female breast: Secondary | ICD-10-CM

## 2017-04-24 DIAGNOSIS — C50912 Malignant neoplasm of unspecified site of left female breast: Secondary | ICD-10-CM

## 2017-04-24 MED FILL — PROCHLORPERAZINE 10 MG TAB: 10 | 8 days supply | Qty: 30 | Fill #0

## 2017-04-24 NOTE — Telephone Encounter (Signed)
Pt called asking if she starts her zofran today or tomorrow. She had aloxi on 11/13. Looked at infusion encounter and told pt she can start zofran today. She also needs compazine refill. She is asking for >30 if possible.

## 2017-04-27 ENCOUNTER — Other Ambulatory Visit: Payer: Self-pay | Admitting: *Deleted

## 2017-04-28 ENCOUNTER — Other Ambulatory Visit: Payer: 59

## 2017-04-28 ENCOUNTER — Ambulatory Visit: Payer: 59

## 2017-05-05 ENCOUNTER — Other Ambulatory Visit: Payer: 59

## 2017-05-05 ENCOUNTER — Ambulatory Visit: Payer: 59

## 2017-05-07 NOTE — Telephone Encounter (Signed)
Told patient we had genetic test results to return to her.  She can stop by the support center/downstairs when she is in for her appointments tomorrow to pick them up. I also told her she could call back to get results and provided my phone number.

## 2017-05-07 NOTE — Progress Notes (Signed)
[place]  [phone] [phone]) [phone]     ID: Mackenzie [name] DOB: August 03, 1970  MR#: [id number]  ZJQ#:[id number]  Patient Care Team: Willey Blade, MD as PCP - General (Internal Medicine) Quianna Avery, [name], MD as Consulting Physician (Oncology) Erroll Luna, MD as Consulting Physician (General Surgery) Kyung Rudd, MD as Consulting Physician (Radiation Oncology) OTHER MD:  CHIEF COMPLAINT: Estrogen receptor positive breast cancer  CURRENT TREATMENT: Neoadjuvant chemotherapy   HISTORY OF CURRENT ILLNESS: From the original intake note:  The patient herself noted some changes in her left breast late July 2018, she says, and eventually brought this to medical attention so that on 01/09/2017 she underwent bilateral diagnostic mammography with tomography and left breast ultrasonography at the breast Center. This found the breast density to be category C. In the upper inner quadrant of the left breast there was an area of asymmetry and there were malignant type calcifications involving all 4 quadrants. On exam there is firmness and palpable thickening in the anterior left breast with skin dimpling. Ultrasonography found at the 9:30 o'clock radiant 3 cm from the nipple a 2.7 cm mass and in the left axilla for abnormal lymph nodes largest of which measured 2.7 cm.  Biopsy of the left breast 9:30 o'clock mass 80 01/26/2017 showed ([id number]) invasive ductal carcinoma, with extracellular mucin, grade 1 or 2. In the lower outer left breast is separated biopsy the same day showed ductal carcinoma in situ. One of the 4 lymph nodes involved was positive for metastatic carcinoma. Prognostic panel on the invasive disease showed it to be estrogen receptor 100% positive, progesterone receptor 20% positive, both with strong staining intensity, with an MIB-1 of 20%, and no HER-2 amplification with a signals ratio 1.38 and the number per cell 2.90.  On 01/22/2017 the  patient underwent bilateral breast MRI. This showed no involvement of the right breast, but in the left breast there was a masslike and non-masslike enhancement involving all quadrants, with skin swelling but no abnormal enhancement of the skin or nipple areolar complex or pectoralis muscle. The mass could not be clearly measured but spanned approximately 12 cm. There was bulky left axillary lymphadenopathy, with the largest lymph node measuring up to 3.4 cm.  The patient's subsequent history is as detailed below.  INTERVAL HISTORY: Mackenzie [name] returns today for a follow-up and treatment of her estrogen receptor positive breast cancer accompanied by her husband.  Today is day 1 cycle 2 of carboplatin and gemcitabine which she receives on day 1 and 8 of each 21-day cycle   REVIEW OF SYSTEMS: Mackenzie [name] endorses intermittent nausea throughout treatment. She has tried using compazine with relief. She was only able to go for a walk two days this past week. The patient reports going to [place] for Christmas and will be gone 12/22 - 12/26. She denies unusual headaches, visual changes, vomiting, or dizziness. There has been no unusual cough, phlegm production, or pleurisy. This been no change in bowel or bladder habits. She denies unexplained fatigue or unexplained weight loss, bleeding, rash, or fever. A detailed review of systems was otherwise entirely stable.   PAST MEDICAL HISTORY: Past Medical History:  Diagnosis Date  . Anemia   . Breast cancer ([place]) 01/14/2017   Left breast  . Diabetes mellitus    GDM    PAST SURGICAL HISTORY: Past Surgical History:  Procedure Laterality Date  . CESAREAN SECTION     x2  . PORTACATH PLACEMENT Right 01/28/2017   Procedure: INSERTION PORT-A-CATH  WITH ULTRASOUND;  Surgeon: Erroll Luna, MD;  Location: [facility];  Service: General;  Laterality: Right;  . TUBAL LIGATION      FAMILY HISTORY Family History  Problem Relation Age of Onset  . Breast  cancer Paternal Grandmother 64       d.60s from breast cancer. Did not have treatment.  . Other Mother        V.37 from complications of surgery to remove brain tumor  The patient's father still alive at age 36. The patient's mother died with a brain tumor which the patient says was "benign". She was 56. The patient has one brother, no sisters. The only breast cancer in the family is a paternal grandmother who died from breast cancer at an unknown age. There is no history of ovarian or prostate cancer in the family.  GYNECOLOGIC HISTORY:  No LMP recorded. Menarche age 34 and first live birth age 76 she is [place] P4. The person is still having regular periods as of August 2018  SOCIAL HISTORY: (As of August 2018) Mikel works at [employer] on the fifth floor, [place]. Her husband, [name], is a Building control surveyor. Daughter and married and lives in [place] and works as a Education administrator. Daughter, [name] lives in [place] and works in the police department. Daughters, [place] and [place] are 64 and 6, living at home. The patient has no grandchildren. She attends a Tour manager   ADVANCED DIRECTIVES: Not in place   HEALTH MAINTENANCE: Social History   Tobacco Use  . Smoking status: Never Smoker  . Smokeless tobacco: Never Used  Substance Use Topics  . Alcohol use: Yes    Comment: social  . Drug use: No     Colonoscopy: Never  PAP: December 2017  Bone density: Never   No Known Allergies  Current Outpatient Medications  Medication Sig Dispense Refill  . B Complex Vitamins (VITAMIN B COMPLEX PO) Take 1 capsule by mouth daily.    . benzonatate (TESSALON) 100 MG capsule Take 1 capsule by mouth every 8 (eight) hours as needed.  0  . cetirizine (ZYRTEC) 10 MG tablet Take 10 mg by mouth daily.      . Cholecalciferol (VITAMIN D PO) Take daily by mouth.    . CRANBERRY PO Take 1 tablet by mouth 2 (two) times daily.    Marland Kitchen dexamethasone (DECADRON) 4 MG tablet Take by mouth.  1  .  HYDROcodone-homatropine (HYCODAN) 5-1.5 MG/5ML syrup Take 5 mLs by mouth every 6 (six) hours as needed.  0  . loratadine (CLARITIN) 10 MG tablet Take 1 tablet (10 mg total) by mouth daily. 90 tablet 2  . LORazepam (ATIVAN) 0.5 MG tablet Take by mouth.  0  . montelukast (SINGULAIR) 10 MG tablet Take 10 mg by mouth at bedtime. prn    . Multiple Vitamin (MULTIVITAMIN) tablet Take 1 tablet by mouth daily.    Marland Kitchen nystatin (MYCOSTATIN) 100000 UNIT/ML suspension Take 5 mLs (500,000 Units total) by mouth 4 (four) times daily. 240 mL 0  . Omega-3 Fatty Acids (FISH OIL) 1000 MG CAPS Take 1,000 mg by mouth 2 (two) times daily.     . ondansetron (ZOFRAN) 8 MG tablet Take 1 tablet (8 mg total) every 8 (eight) hours as needed by mouth for nausea or vomiting. 20 tablet 0  . prochlorperazine (COMPAZINE) 10 MG tablet Take by mouth.  1  . prochlorperazine (COMPAZINE) 10 MG tablet TAKE 1 TABLET (10 MG TOTAL) BY MOUTH EVERY 6 (SIX) HOURS AS  NEEDED (NAUSEA OR VOMITING). 30 tablet 1  . terbinafine (LAMISIL) 250 MG tablet Take 1 tablet (250 mg total) daily by mouth. 90 tablet 0   No current facility-administered medications for this visit.    Facility-Administered Medications Ordered in Other Visits  Medication Dose Route Frequency Provider Last Rate Last Dose  . sodium chloride flush (NS) 0.9 % injection 10 mL  10 mL Intravenous PRN Opal Dinning, [name], MD   10 mL at 03/10/17 1239    OBJECTIVE: Morbidly obese African-American woman in no acute distress  Vitals:   05/08/17 1032  BP: 123/69  Pulse: (!) 104  Resp: 20  Temp: 98.2 F (36.8 C)  SpO2: 100%     Body mass index is 47.52 kg/m.   Wt Readings from Last 3 Encounters:  05/08/17 294 lb 6.4 oz (133.5 kg)  04/21/17 288 lb 14.4 oz (131 kg)  04/14/17 285 lb 14.4 oz (129.7 kg)      ECOG FS:1 - Symptomatic but completely ambulatory   Sclerae unicteric, EOMs intact Oropharynx clear and moist No cervical or supraclavicular adenopathy Lungs no rales or  rhonchi Heart regular rate and rhythm Abd soft, nontender, positive bowel sounds MSK no focal spinal tenderness, no upper extremity lymphedema Neuro: nonfocal, well oriented, appropriate affect Breasts: The skin of the left breast is slightly more edematous than the right, with a easily visible pores, and the nipple is prominent.  However I do not palpate a mass.  The right breast is benign.  Both axillae are benign  LAB RESULTS:    Appointment on 05/08/2017  Component Date Value Ref Range Status  . WBC 05/08/2017 4.8  3.9 - 10.3 10e3/uL Final  . NEUT# 05/08/2017 2.9  1.5 - 6.5 10e3/uL Final  . HGB 05/08/2017 10.3* 11.6 - 15.9 g/dL Final  . HCT 05/08/2017 32.7* 34.8 - 46.6 % Final  . Platelets 05/08/2017 393  145 - 400 10e3/uL Final  . MCV 05/08/2017 86.1  79.5 - 101.0 fL Final  . MCH 05/08/2017 27.1  25.1 - 34.0 pg Final  . MCHC 05/08/2017 31.5  31.5 - 36.0 g/dL Final  . RBC 05/08/2017 3.80  3.70 - 5.45 10e6/uL Final  . RDW 05/08/2017 18.4* 11.2 - 14.5 % Final  . lymph# 05/08/2017 1.0  0.9 - 3.3 10e3/uL Final  . MONO# 05/08/2017 0.8  0.1 - 0.9 10e3/uL Final  . Eosinophils Absolute 05/08/2017 0.1  0.0 - 0.5 10e3/uL Final  . Basophils Absolute 05/08/2017 0.0  0.0 - 0.1 10e3/uL Final  . NEUT% 05/08/2017 60.9  38.4 - 76.8 % Final  . LYMPH% 05/08/2017 20.6  14.0 - 49.7 % Final  . MONO% 05/08/2017 15.8* 0.0 - 14.0 % Final  . EOS% 05/08/2017 2.3  0.0 - 7.0 % Final  . BASO% 05/08/2017 0.4  0.0 - 2.0 % Final  . Sodium 05/08/2017 136  136 - 145 mEq/L Final  . Potassium 05/08/2017 3.8  3.5 - 5.1 mEq/L Final  . Chloride 05/08/2017 103  98 - 109 mEq/L Final  . CO2 05/08/2017 25  22 - 29 mEq/L Final  . Glucose 05/08/2017 195* 70 - 140 mg/dl Final   Glucose reference range is for nonfasting patients. Fasting glucose reference range is 70- 100.  Marland Kitchen BUN 05/08/2017 17.4  7.0 - 26.0 mg/dL Final  . Creatinine 05/08/2017 0.9  0.6 - 1.1 mg/dL Final  . Total Bilirubin 05/08/2017 0.31  0.20 - 1.20  mg/dL Final  . Alkaline Phosphatase 05/08/2017 93  40 - 150  U/L Final  . AST 05/08/2017 20  5 - 34 U/L Final  . ALT 05/08/2017 35  0 - 55 U/L Final  . Total Protein 05/08/2017 7.1  6.4 - 8.3 g/dL Final  . Albumin 05/08/2017 3.6  3.5 - 5.0 g/dL Final  . Calcium 05/08/2017 9.3  8.4 - 10.4 mg/dL Final  . Anion Gap 05/08/2017 8  3 - 11 mEq/L Final  . EGFR 05/08/2017 >60  >60 ml/min/1.73 m2 Final   eGFR is calculated using the CKD-EPI Creatinine Equation (2009)       STUDIES: No results found.   ELIGIBLE FOR AVAILABLE RESEARCH PROTOCOL: no  ASSESSMENT: 46 y.o. [place] woman status post left breast upper inner quadrant biopsy 01/14/2017 for a clinical T3 N2, stage IIA invasive ductal carcinoma, grade 1 or 2, estrogen and progesterone receptor positive, HER-2 nonamplified, with an MIB-1 of 20%.  (1) staging studies: Brain MRI, bone scan, and CT scan of the chest 02/05/2017 showed no brain lesions, no lung or liver lesions, a 4.9 cm mass in the left breast with left axillary and subpectoral adenopathy, and nonspecific bone scan tracer at L2, left scapula, and anterior ribs, with lumbar spine MRI suggested for further evaluation.  (2) neoadjuvant chemotherapy consisting of cyclophosphamide and doxorubicin in dose dense fashion 4 starting 02/10/2017, completed 03/24/2017, to be followed by weekly carboplatin and gemcitabine given days 1 and 8 of each 21-day cycle starting 04/14/2017  (3) definitive surgery to follow, most likely left modified radical mastectomy  (4) postmastectomy radiation is indicated  (5) adjuvant anti-estrogens to follow  (6) genetics testing 03/24/2017 through the Common Hereditary Cancer Panel offered by [facility] found no deleterious mutations in APC, ATM, AXIN2, BARD1, BMPR1A, BRCA1, BRCA2, BRIP1, CDH1, CDKN2A (p14ARF), CDKN2A (p16INK4a), CHEK2, CTNNA1, DICER1, EPCAM (Deletion/duplication testing only), GREM1 (promoter region deletion/duplication testing only), KIT,  MEN1, MLH1, MSH2, MSH3, MSH6, MUTYH, NBN, NF1, NHTL1, PALB2, PDGFRA, PMS2, POLD1, POLE, PTEN, RAD50, RAD51C, RAD51D, SDHB, SDHC, SDHD, SMAD4, SMARCA4. STK11, TP53, TSC1, TSC2, and VHL.  The following genes were evaluated for sequence changes only: SDHA and HOXB13 c.251G>A variant only.    PLAN:  Mackenzie [name] is tolerating her neoadjuvant chemotherapy well and we are proceeding with cycle 2 today.  The plan is to try to get her through 6 cycles and then proceed to surgery.  She is having to extend her leave and she brought [name] some paperwork regarding that today.  I suggested she increase her exercise program, currently at 2 days a week of walking, 2 for 5 days of walking and that she also use the gym available to her through [facility].  This will get her in shape for when she receives her surgery and radiation treatments.  Otherwise she knows to call for any problems that may develop before the next scheduled visit.   Othello Sgroi, [name], MD  05/08/17 11:27 AM Medical Oncology and Hematology [facility] [address] [place], [place] [address] Tel. [phone]    Fax. [phone]  This document serves as a record of services personally performed by Chauncey Cruel, MD. It was created on his behalf by Margit Banda, a trained medical scribe. The creation of this record is based on the scribe's personal observations and the provider's statements to them.   I have reviewed the above documentation for accuracy and completeness, and I agree with the above.

## 2017-05-08 ENCOUNTER — Ambulatory Visit: Payer: 59

## 2017-05-08 ENCOUNTER — Other Ambulatory Visit (HOSPITAL_BASED_OUTPATIENT_CLINIC_OR_DEPARTMENT_OTHER): Payer: 59

## 2017-05-08 ENCOUNTER — Ambulatory Visit (HOSPITAL_BASED_OUTPATIENT_CLINIC_OR_DEPARTMENT_OTHER): Payer: 59 | Admitting: Oncology

## 2017-05-08 ENCOUNTER — Ambulatory Visit (HOSPITAL_BASED_OUTPATIENT_CLINIC_OR_DEPARTMENT_OTHER): Payer: 59

## 2017-05-08 VITALS — BP 123/69 | HR 104 | Temp 98.2°F | Resp 20 | Ht 66.0 in | Wt 294.4 lb

## 2017-05-08 VITALS — HR 97

## 2017-05-08 DIAGNOSIS — C50812 Malignant neoplasm of overlapping sites of left female breast: Secondary | ICD-10-CM

## 2017-05-08 DIAGNOSIS — Z170 Estrogen receptor positive status [ER+]: Principal | ICD-10-CM

## 2017-05-08 DIAGNOSIS — Z95828 Presence of other vascular implants and grafts: Secondary | ICD-10-CM

## 2017-05-08 DIAGNOSIS — C773 Secondary and unspecified malignant neoplasm of axilla and upper limb lymph nodes: Secondary | ICD-10-CM

## 2017-05-08 DIAGNOSIS — Z5111 Encounter for antineoplastic chemotherapy: Secondary | ICD-10-CM

## 2017-05-08 DIAGNOSIS — C50912 Malignant neoplasm of unspecified site of left female breast: Secondary | ICD-10-CM

## 2017-05-08 LAB — CBC WITH DIFFERENTIAL/PLATELET
BASO%: 0.4 % (ref 0.0–2.0)
BASOS ABS: 0 10*3/uL (ref 0.0–0.1)
EOS ABS: 0.1 10*3/uL (ref 0.0–0.5)
EOS%: 2.3 % (ref 0.0–7.0)
HCT: 32.7 % — ABNORMAL LOW (ref 34.8–46.6)
HGB: 10.3 g/dL — ABNORMAL LOW (ref 11.6–15.9)
LYMPH%: 20.6 % (ref 14.0–49.7)
MCH: 27.1 pg (ref 25.1–34.0)
MCHC: 31.5 g/dL (ref 31.5–36.0)
MCV: 86.1 fL (ref 79.5–101.0)
MONO#: 0.8 10*3/uL (ref 0.1–0.9)
MONO%: 15.8 % — AB (ref 0.0–14.0)
NEUT%: 60.9 % (ref 38.4–76.8)
NEUTROS ABS: 2.9 10*3/uL (ref 1.5–6.5)
Platelets: 393 10*3/uL (ref 145–400)
RBC: 3.8 10*6/uL (ref 3.70–5.45)
RDW: 18.4 % — ABNORMAL HIGH (ref 11.2–14.5)
WBC: 4.8 10*3/uL (ref 3.9–10.3)
lymph#: 1 10*3/uL (ref 0.9–3.3)

## 2017-05-08 LAB — COMPREHENSIVE METABOLIC PANEL
ALT: 35 U/L (ref 0–55)
AST: 20 U/L (ref 5–34)
Albumin: 3.6 g/dL (ref 3.5–5.0)
Alkaline Phosphatase: 93 U/L (ref 40–150)
Anion Gap: 8 mEq/L (ref 3–11)
BUN: 17.4 mg/dL (ref 7.0–26.0)
CO2: 25 meq/L (ref 22–29)
Calcium: 9.3 mg/dL (ref 8.4–10.4)
Chloride: 103 mEq/L (ref 98–109)
Creatinine: 0.9 mg/dL (ref 0.6–1.1)
GLUCOSE: 195 mg/dL — AB (ref 70–140)
POTASSIUM: 3.8 meq/L (ref 3.5–5.1)
SODIUM: 136 meq/L (ref 136–145)
Total Bilirubin: 0.31 mg/dL (ref 0.20–1.20)
Total Protein: 7.1 g/dL (ref 6.4–8.3)

## 2017-05-08 MED ORDER — DEXAMETHASONE SODIUM PHOSPHATE 10 MG/ML IJ SOLN
INTRAMUSCULAR | Status: AC
  Filled 2017-05-08: qty 1

## 2017-05-08 MED ORDER — DEXAMETHASONE SODIUM PHOSPHATE 10 MG/ML IJ SOLN
10.0000 mg | Freq: Once | INTRAMUSCULAR | Status: AC
  Administered 2017-05-08: 10 mg via INTRAVENOUS

## 2017-05-08 MED ORDER — PALONOSETRON HCL INJECTION 0.25 MG/5ML
INTRAVENOUS | Status: AC
  Filled 2017-05-08: qty 5

## 2017-05-08 MED ORDER — LORAZEPAM 0.5 MG PO TABS: 0.5000 mg | ORAL_TABLET | Freq: Three times a day (TID) | ORAL | 0 refills | Status: DC | PRN

## 2017-05-08 MED ORDER — SODIUM CHLORIDE 0.9 % IV SOLN
2000.0000 mg | Freq: Once | INTRAVENOUS | Status: AC
  Administered 2017-05-08: 2000 mg via INTRAVENOUS
  Filled 2017-05-08: qty 52.6

## 2017-05-08 MED ORDER — SODIUM CHLORIDE 0.9 % IV SOLN
Freq: Once | INTRAVENOUS | Status: AC
  Administered 2017-05-08: 12:00:00 via INTRAVENOUS

## 2017-05-08 MED ORDER — DEXAMETHASONE SODIUM PHOSPHATE 100 MG/10ML IJ SOLN: 10.0000 mg | Freq: Once | INTRAMUSCULAR | Status: DC

## 2017-05-08 MED ORDER — SODIUM CHLORIDE 0.9% FLUSH
10.0000 mL | INTRAVENOUS | Status: DC | PRN
  Administered 2017-05-08: 10 mL via INTRAVENOUS
  Filled 2017-05-08: qty 10

## 2017-05-08 MED ORDER — PALONOSETRON HCL INJECTION 0.25 MG/5ML
0.2500 mg | Freq: Once | INTRAVENOUS | Status: AC
  Administered 2017-05-08: 0.25 mg via INTRAVENOUS

## 2017-05-08 MED ORDER — HEPARIN SOD (PORK) LOCK FLUSH 100 UNIT/ML IV SOLN
500.0000 [IU] | Freq: Once | INTRAVENOUS | Status: AC | PRN
  Administered 2017-05-08: 500 [IU]
  Filled 2017-05-08: qty 5

## 2017-05-08 MED ORDER — CARBOPLATIN CHEMO INJECTION 450 MG/45ML
300.0000 mg | Freq: Once | INTRAVENOUS | Status: AC
  Administered 2017-05-08: 300 mg via INTRAVENOUS
  Filled 2017-05-08: qty 30

## 2017-05-08 MED ORDER — SODIUM CHLORIDE 0.9% FLUSH
10.0000 mL | INTRAVENOUS | Status: DC | PRN
  Administered 2017-05-08: 10 mL
  Filled 2017-05-08: qty 10

## 2017-05-08 MED FILL — LORazepam 0.5 MG TABS: 0.5 | 10 days supply | Qty: 30 | Fill #0

## 2017-05-08 NOTE — Patient Instructions (Signed)
Cancer Center Discharge Instructions for Patients Receiving Chemotherapy  Today you received the following chemotherapy agents Gemzar and Carboplatin   To help prevent nausea and vomiting after your treatment, we encourage you to take your nausea medication as directed.    If you develop nausea and vomiting that is not controlled by your nausea medication, call the clinic.   BELOW ARE SYMPTOMS THAT SHOULD BE REPORTED IMMEDIATELY:  *FEVER GREATER THAN 100.5 F  *CHILLS WITH OR WITHOUT FEVER  NAUSEA AND VOMITING THAT IS NOT CONTROLLED WITH YOUR NAUSEA MEDICATION  *UNUSUAL SHORTNESS OF BREATH  *UNUSUAL BRUISING OR BLEEDING  TENDERNESS IN MOUTH AND THROAT WITH OR WITHOUT PRESENCE OF ULCERS  *URINARY PROBLEMS  *BOWEL PROBLEMS  UNUSUAL RASH Items with * indicate a potential emergency and should be followed up as soon as possible.  Feel free to call the clinic should you have any questions or concerns. The clinic phone number is [phone].  Please show the CHEMO ALERT CARD at check-in to the Emergency Department and triage nurse.   

## 2017-05-12 ENCOUNTER — Other Ambulatory Visit: Payer: 59

## 2017-05-12 ENCOUNTER — Ambulatory Visit: Payer: 59

## 2017-05-14 ENCOUNTER — Telehealth: Payer: Self-pay | Admitting: Genetics

## 2017-05-14 NOTE — Progress Notes (Signed)
[place]  [phone] [phone]     ID: Mackenzie [name] DOB: 23-Jun-1970  MR#: [id number]  KVQ#:[id number]  Patient Care Team: Mackenzie Blade, MD as PCP - General (Internal Medicine) Magrinat, Mackenzie Dad, MD as Consulting Physician (Oncology) Mackenzie Luna, MD as Consulting Physician (General Surgery) Mackenzie Rudd, MD as Consulting Physician (Radiation Oncology) OTHER MD:  CHIEF COMPLAINT: Estrogen receptor positive breast cancer  CURRENT TREATMENT: Neoadjuvant chemotherapy   HISTORY OF CURRENT ILLNESS: From the original intake note:  The patient herself noted some changes in her left breast late July 2018, she says, and eventually brought this to medical attention so that on 01/09/2017 she underwent bilateral diagnostic mammography with tomography and left breast ultrasonography at the breast Center. This found the breast density to be category C. In the upper inner quadrant of the left breast there was an area of asymmetry and there were malignant type calcifications involving all 4 quadrants. On exam there is firmness and palpable thickening in the anterior left breast with skin dimpling. Ultrasonography found at the 9:30 o'clock radiant 3 cm from the nipple a 2.7 cm mass and in the left axilla for abnormal lymph nodes largest of which measured 2.7 cm.  Biopsy of the left breast 9:30 o'clock mass 80 01/26/2017 showed ([id number]) invasive ductal carcinoma, with extracellular mucin, grade 1 or 2. In the lower outer left breast is separated biopsy the same day showed ductal carcinoma in situ. One of the 4 lymph nodes involved was positive for metastatic carcinoma. Prognostic panel on the invasive disease showed it to be estrogen receptor 100% positive, progesterone receptor 20% positive, both with strong staining intensity, with an MIB-1 of 20%, and no HER-2 amplification with a signals ratio 1.38 and the number per cell 2.90.  On 01/22/2017 the  patient underwent bilateral breast MRI. This showed no involvement of the right breast, but in the left breast there was a masslike and non-masslike enhancement involving all quadrants, with skin swelling but no abnormal enhancement of the skin or nipple areolar complex or pectoralis muscle. The mass could not be clearly measured but spanned approximately 12 cm. There was bulky left axillary lymphadenopathy, with the largest lymph node measuring up to 3.4 cm.  The patient's subsequent history is as detailed below.  INTERVAL HISTORY: Mackenzie [name] returns today for evaluation and treatment in the setting of estrogen receptor positive breast cancer. She is accompanied by her husband. She is currently receiving neoadjuvant chemotherapy and today will receive day 8 of cycle 2 of 4 planned cycles of carboplatin and gemcitabine given on days 1 and 8 of each 21-day cycle.   REVIEW OF SYSTEMS: Mackenzie [name] is doing well. She reports mild nausea, but no emesis. She hasn't been exercising as much, but did walk a few days this past week. She has been experiencing increased fatigue. In the morning she will have some phlegm production and notes a pinkish color. She does not think it is blood. She denies unusual headaches, visual changes, vomiting, or dizziness. There has been no unusual cough, or pleurisy. This been no change in bowel or bladder habits. She denies unexplained fatigue or unexplained weight loss, bleeding, rash, or fever. A detailed review of systems was otherwise entirely stable.   PAST MEDICAL HISTORY: Past Medical History:  Diagnosis Date  . Anemia   . Breast cancer ([place]) 01/14/2017   Left breast  . Diabetes mellitus    GDM    PAST SURGICAL HISTORY: Past Surgical History:  Procedure  Laterality Date  . CESAREAN SECTION     x2  . PORTACATH PLACEMENT Right 01/28/2017   Procedure: INSERTION PORT-A-CATH WITH ULTRASOUND;  Surgeon: Mackenzie Luna, MD;  Location: [facility];  Service:  General;  Laterality: Right;  . TUBAL LIGATION      FAMILY HISTORY Family History  Problem Relation Age of Onset  . Breast cancer Paternal Grandmother 52       d.60s from breast cancer. Did not have treatment.  . Other Mother        P.10 from complications of surgery to remove brain tumor  The patient's father still alive at age 23. The patient's mother died with a brain tumor which the patient says was "benign". She was 56. The patient has one brother, no sisters. The only breast cancer in the family is a paternal grandmother who died from breast cancer at an unknown age. There is no history of ovarian or prostate cancer in the family.  GYNECOLOGIC HISTORY:  No LMP recorded. Menarche age 64 and first live birth age 31 she is [place] P4. The person is still having regular periods as of August 2018  SOCIAL HISTORY: (As of August 2018) Mackenzie [name] works at [employer] on the fifth floor, [place]. Her husband, Mackenzie [name], is a Building control surveyor. Daughter and married and lives in [place] and works as a Education administrator. Daughter, Mackenzie [name] lives in [place] and works in the police department. Daughters, [place] and [place] are 31 and 6, living at home. The patient has no grandchildren. She attends a Tour manager   ADVANCED DIRECTIVES: Not in place   HEALTH MAINTENANCE: Social History   Tobacco Use  . Smoking status: Never Smoker  . Smokeless tobacco: Never Used  Substance Use Topics  . Alcohol use: Yes    Comment: social  . Drug use: No     Colonoscopy: Never  PAP: December 2017  Bone density: Never   No Known Allergies  Current Outpatient Medications  Medication Sig Dispense Refill  . B Complex Vitamins (VITAMIN B COMPLEX PO) Take 1 capsule by mouth daily.    . benzonatate (TESSALON) 100 MG capsule Take 1 capsule by mouth every 8 (eight) hours as needed.  0  . cetirizine (ZYRTEC) 10 MG tablet Take 10 mg by mouth daily.      . Cholecalciferol (VITAMIN D PO) Take daily by mouth.      . CRANBERRY PO Take 1 tablet by mouth 2 (two) times daily.    Marland Kitchen loratadine (CLARITIN) 10 MG tablet Take 1 tablet (10 mg total) by mouth daily. 90 tablet 2  . LORazepam (ATIVAN) 0.5 MG tablet Take 1 tablet (0.5 mg total) by mouth every 8 (eight) hours as needed for anxiety. 30 tablet 0  . montelukast (SINGULAIR) 10 MG tablet Take 10 mg by mouth at bedtime. prn    . Multiple Vitamin (MULTIVITAMIN) tablet Take 1 tablet by mouth daily.    Marland Kitchen nystatin (MYCOSTATIN) 100000 UNIT/ML suspension Take 5 mLs (500,000 Units total) by mouth 4 (four) times daily. 240 mL 0  . Omega-3 Fatty Acids (FISH OIL) 1000 MG CAPS Take 1,000 mg by mouth 2 (two) times daily.     . prochlorperazine (COMPAZINE) 10 MG tablet Take by mouth.  1  . prochlorperazine (COMPAZINE) 10 MG tablet TAKE 1 TABLET (10 MG TOTAL) BY MOUTH EVERY 6 (SIX) HOURS AS NEEDED (NAUSEA OR VOMITING). 30 tablet 1  . terbinafine (LAMISIL) 250 MG tablet Take 1 tablet (250 mg total) daily by mouth.  90 tablet 0   No current facility-administered medications for this visit.    Facility-Administered Medications Ordered in Other Visits  Medication Dose Route Frequency Provider Last Rate Last Dose  . sodium chloride flush (NS) 0.9 % injection 10 mL  10 mL Intravenous PRN Magrinat, Mackenzie Dad, MD   10 mL at 03/10/17 1239    OBJECTIVE: Morbidly obese African-American woman who appears well  Vitals:   05/15/17 1136  BP: (!) 146/93  Pulse: (!) 102  Resp: 18  Temp: 97.9 F (36.6 C)  SpO2: 100%     Body mass index is 47.53 kg/m.   Wt Readings from Last 3 Encounters:  05/15/17 294 lb 8 oz (133.6 kg)  05/15/17 294 lb 8 oz (133.6 kg)  05/08/17 294 lb 6.4 oz (133.5 kg)      ECOG FS:0 - Asymptomatic   Sclerae unicteric, pupils round and equal Oropharynx clear and moist No cervical or supraclavicular adenopathy Lungs no rales or rhonchi Heart regular rate and rhythm Abd soft, nontender, positive bowel sounds MSK no focal spinal tenderness, no upper  extremity lymphedema Neuro: nonfocal, well oriented, appropriate affect Breasts: I no longer palpate a mass in the left breast.  There is some skin thickening but no erythema.  The right breast is unremarkable and both axillae are benign  LAB RESULTS:    Appointment on 05/15/2017  Component Date Value Ref Range Status  . WBC 05/15/2017 4.4  3.9 - 10.3 10e3/uL Final  . NEUT# 05/15/2017 2.8  1.5 - 6.5 10e3/uL Final  . HGB 05/15/2017 10.9* 11.6 - 15.9 g/dL Final  . HCT 05/15/2017 33.6* 34.8 - 46.6 % Final  . Platelets 05/15/2017 298  145 - 400 10e3/uL Final  . MCV 05/15/2017 85.1  79.5 - 101.0 fL Final  . MCH 05/15/2017 27.6  25.1 - 34.0 pg Final  . MCHC 05/15/2017 32.4  31.5 - 36.0 g/dL Final  . RBC 05/15/2017 3.95  3.70 - 5.45 10e6/uL Final  . RDW 05/15/2017 16.7* 11.2 - 14.5 % Final  . lymph# 05/15/2017 1.4  0.9 - 3.3 10e3/uL Final  . MONO# 05/15/2017 0.1  0.1 - 0.9 10e3/uL Final  . Eosinophils Absolute 05/15/2017 0.0  0.0 - 0.5 10e3/uL Final  . Basophils Absolute 05/15/2017 0.1  0.0 - 0.1 10e3/uL Final  . NEUT% 05/15/2017 63.5  38.4 - 76.8 % Final  . LYMPH% 05/15/2017 31.8  14.0 - 49.7 % Final  . MONO% 05/15/2017 2.7  0.0 - 14.0 % Final  . EOS% 05/15/2017 0.9  0.0 - 7.0 % Final  . BASO% 05/15/2017 1.1  0.0 - 2.0 % Final  . nRBC 05/15/2017 0  0 - 0 % Final       STUDIES: No results found.   ELIGIBLE FOR AVAILABLE RESEARCH PROTOCOL: no  ASSESSMENT: 46 y.o. [place] woman status post left breast upper inner quadrant biopsy 01/14/2017 for a clinical T3 N2, stage IIA invasive ductal carcinoma, grade 1 or 2, estrogen and progesterone receptor positive, HER-2 nonamplified, with an MIB-1 of 20%.  (1) staging studies: Brain MRI, bone scan, and CT scan of the chest 02/05/2017 showed no brain lesions, no lung or liver lesions, a 4.9 cm mass in the left breast with left axillary and subpectoral adenopathy, and nonspecific bone scan tracer at L2, left scapula, and anterior ribs, with  lumbar spine MRI suggested for further evaluation.  (2) neoadjuvant chemotherapy consisting of cyclophosphamide and doxorubicin in dose dense fashion 4 starting 02/10/2017, completed 03/24/2017, to be followed  by weekly carboplatin and gemcitabine given days 1 and 8 of each 21-day cycle starting 04/14/2017  (3) definitive surgery to follow, most likely left modified radical mastectomy  (4) postmastectomy radiation is indicated  (5) adjuvant anti-estrogens to follow  (6) genetics testing 03/24/2017 through the Common Hereditary Cancer Panel offered by [facility] found no deleterious mutations in APC, ATM, AXIN2, BARD1, BMPR1A, BRCA1, BRCA2, BRIP1, CDH1, CDKN2A (p14ARF), CDKN2A (p16INK4a), CHEK2, CTNNA1, DICER1, EPCAM (Deletion/duplication testing only), GREM1 (promoter region deletion/duplication testing only), KIT, MEN1, MLH1, MSH2, MSH3, MSH6, MUTYH, NBN, NF1, NHTL1, PALB2, PDGFRA, PMS2, POLD1, POLE, PTEN, RAD50, RAD51C, RAD51D, SDHB, SDHC, SDHD, SMAD4, SMARCA4. STK11, TP53, TSC1, TSC2, and VHL.  The following genes were evaluated for sequence changes only: SDHA and HOXB13 c.251G>A variant only.    PLAN:  Wannetta continues to tolerate her chemotherapy remarkably well.  She will receive the day 8 treatment of cycle 2 today.  She will return in 2 weeks to start cycle 3 and then in 5 weeks to start cycle 5.  All those appointments and visits have already been placed.  I have gone ahead and set her up for MRI of the breast to be done January 23, a few days after her last chemotherapy treatment.  She will see me the day after to discuss results and I have sent Dr. Brantley Stage a note alerting him of these dates so he can plan to see her around that time as well to plan her definitive surgery.  She may lose a fingernail or 2.  This is distressing, but unfortunately unavoidable.  She is having no peripheral neuropathy.  She knows to call for any other issues that may develop before her next visit.  Magrinat,  Mackenzie Dad, MD  05/15/17 11:40 AM Medical Oncology and Hematology [facility] [address] [place], [place] [address] Tel. [phone]    Fax. [phone]  This document serves as a record of services personally performed by Chauncey Cruel, MD. It was created on his behalf by Margit Banda, a trained medical scribe. The creation of this record is based on the scribe's personal observations and the provider's statements to them.

## 2017-05-14 NOTE — Telephone Encounter (Signed)
Left message telling pt we had results for her.  Asked her to call back to discuss them or she could try to stop by our office tomorrow while she is at the cancer center (ground level/support center).

## 2017-05-15 ENCOUNTER — Ambulatory Visit: Payer: 59

## 2017-05-15 ENCOUNTER — Ambulatory Visit (HOSPITAL_BASED_OUTPATIENT_CLINIC_OR_DEPARTMENT_OTHER): Payer: 59 | Admitting: Oncology

## 2017-05-15 ENCOUNTER — Ambulatory Visit (HOSPITAL_BASED_OUTPATIENT_CLINIC_OR_DEPARTMENT_OTHER): Payer: 59

## 2017-05-15 ENCOUNTER — Other Ambulatory Visit (HOSPITAL_BASED_OUTPATIENT_CLINIC_OR_DEPARTMENT_OTHER): Payer: 59

## 2017-05-15 ENCOUNTER — Telehealth: Payer: Self-pay | Admitting: Oncology

## 2017-05-15 VITALS — BP 146/93 | HR 102 | Temp 97.9°F | Resp 18 | Wt 294.5 lb

## 2017-05-15 VITALS — BP 146/93 | HR 102 | Temp 97.9°F | Resp 18 | Ht 66.0 in | Wt 294.5 lb

## 2017-05-15 VITALS — HR 92

## 2017-05-15 DIAGNOSIS — Z170 Estrogen receptor positive status [ER+]: Principal | ICD-10-CM

## 2017-05-15 DIAGNOSIS — C50912 Malignant neoplasm of unspecified site of left female breast: Secondary | ICD-10-CM

## 2017-05-15 DIAGNOSIS — Z5111 Encounter for antineoplastic chemotherapy: Secondary | ICD-10-CM

## 2017-05-15 DIAGNOSIS — C773 Secondary and unspecified malignant neoplasm of axilla and upper limb lymph nodes: Secondary | ICD-10-CM | POA: Diagnosis not present

## 2017-05-15 DIAGNOSIS — C50812 Malignant neoplasm of overlapping sites of left female breast: Secondary | ICD-10-CM | POA: Diagnosis not present

## 2017-05-15 DIAGNOSIS — Z95828 Presence of other vascular implants and grafts: Secondary | ICD-10-CM

## 2017-05-15 LAB — COMPREHENSIVE METABOLIC PANEL
ALBUMIN: 3.8 g/dL (ref 3.5–5.0)
ALK PHOS: 103 U/L (ref 40–150)
ALT: 24 U/L (ref 0–55)
AST: 18 U/L (ref 5–34)
Anion Gap: 11 mEq/L (ref 3–11)
BUN: 14 mg/dL (ref 7.0–26.0)
CALCIUM: 9.5 mg/dL (ref 8.4–10.4)
CO2: 22 mEq/L (ref 22–29)
CREATININE: 0.9 mg/dL (ref 0.6–1.1)
Chloride: 103 mEq/L (ref 98–109)
EGFR: >60 mL/min/{1.73_m2} (ref >60)
Glucose: 197 mg/dl — ABNORMAL HIGH (ref 70–140)
Potassium: 3.7 mEq/L (ref 3.5–5.1)
Sodium: 136 mEq/L (ref 136–145)
TOTAL PROTEIN: 7.4 g/dL (ref 6.4–8.3)
Total Bilirubin: 0.24 mg/dL (ref 0.20–1.20)

## 2017-05-15 LAB — CBC WITH DIFFERENTIAL/PLATELET
BASO%: 1.1 % (ref 0.0–2.0)
Basophils Absolute: 0.1 10*3/uL (ref 0.0–0.1)
EOS%: 0.9 % (ref 0.0–7.0)
Eosinophils Absolute: 0 10*3/uL (ref 0.0–0.5)
HEMATOCRIT: 33.6 % — AB (ref 34.8–46.6)
HEMOGLOBIN: 10.9 g/dL — AB (ref 11.6–15.9)
LYMPH#: 1.4 10*3/uL (ref 0.9–3.3)
LYMPH%: 31.8 % (ref 14.0–49.7)
MCH: 27.6 pg (ref 25.1–34.0)
MCHC: 32.4 g/dL (ref 31.5–36.0)
MCV: 85.1 fL (ref 79.5–101.0)
MONO#: 0.1 10*3/uL (ref 0.1–0.9)
MONO%: 2.7 % (ref 0.0–14.0)
NEUT%: 63.5 % (ref 38.4–76.8)
NEUTROS ABS: 2.8 10*3/uL (ref 1.5–6.5)
Platelets: 298 10*3/uL (ref 145–400)
RBC: 3.95 10*6/uL (ref 3.70–5.45)
RDW: 16.7 % — ABNORMAL HIGH (ref 11.2–14.5)
WBC: 4.4 10*3/uL (ref 3.9–10.3)
nRBC: 0 % (ref 0–0)

## 2017-05-15 MED ORDER — DEXAMETHASONE SODIUM PHOSPHATE 10 MG/ML IJ SOLN
10.0000 mg | Freq: Once | INTRAMUSCULAR | Status: AC
  Administered 2017-05-15: 10 mg via INTRAVENOUS

## 2017-05-15 MED ORDER — SODIUM CHLORIDE 0.9 % IV SOLN
300.0000 mg | Freq: Once | INTRAVENOUS | Status: AC
  Administered 2017-05-15: 300 mg via INTRAVENOUS
  Filled 2017-05-15: qty 30

## 2017-05-15 MED ORDER — SODIUM CHLORIDE 0.9 % IV SOLN
2000.0000 mg | Freq: Once | INTRAVENOUS | Status: AC
  Administered 2017-05-15: 2000 mg via INTRAVENOUS
  Filled 2017-05-15: qty 52.6

## 2017-05-15 MED ORDER — PALONOSETRON HCL INJECTION 0.25 MG/5ML
INTRAVENOUS | Status: AC
  Filled 2017-05-15: qty 5

## 2017-05-15 MED ORDER — HEPARIN SOD (PORK) LOCK FLUSH 100 UNIT/ML IV SOLN
500.0000 [IU] | Freq: Once | INTRAVENOUS | Status: AC | PRN
  Administered 2017-05-15: 500 [IU]
  Filled 2017-05-15: qty 5

## 2017-05-15 MED ORDER — PALONOSETRON HCL INJECTION 0.25 MG/5ML
0.2500 mg | Freq: Once | INTRAVENOUS | Status: AC
  Administered 2017-05-15: 0.25 mg via INTRAVENOUS

## 2017-05-15 MED ORDER — SODIUM CHLORIDE 0.9 % IV SOLN
Freq: Once | INTRAVENOUS | Status: AC
Start: 2017-05-15 — End: 2017-05-15
  Administered 2017-05-15: 12:00:00 via INTRAVENOUS

## 2017-05-15 MED ORDER — SODIUM CHLORIDE 0.9% FLUSH
10.0000 mL | Freq: Once | INTRAVENOUS | Status: AC
  Administered 2017-05-15: 10 mL via INTRAVENOUS
  Filled 2017-05-15: qty 10

## 2017-05-15 MED ORDER — SODIUM CHLORIDE 0.9% FLUSH
10.0000 mL | INTRAVENOUS | Status: DC | PRN
  Administered 2017-05-15: 10 mL
  Filled 2017-05-15: qty 10

## 2017-05-15 MED ORDER — DEXAMETHASONE SODIUM PHOSPHATE 10 MG/ML IJ SOLN
INTRAMUSCULAR | Status: AC
  Filled 2017-05-15: qty 1

## 2017-05-15 NOTE — Telephone Encounter (Signed)
Sched patient per 12/7 los, also printed AVS/SCHED for patient

## 2017-05-15 NOTE — Patient Instructions (Signed)
[facility] Discharge Instructions for Patients Receiving Chemotherapy  Today you received the following chemotherapy agents Gemzar and carboplatin  To help prevent nausea and vomiting after your treatment, we encourage you to take your nausea medication as directed If you develop nausea and vomiting that is not controlled by your nausea medication, call the clinic.   BELOW ARE SYMPTOMS THAT SHOULD BE REPORTED IMMEDIATELY:  *FEVER GREATER THAN 100.5 F  *CHILLS WITH OR WITHOUT FEVER  NAUSEA AND VOMITING THAT IS NOT CONTROLLED WITH YOUR NAUSEA MEDICATION  *UNUSUAL SHORTNESS OF BREATH  *UNUSUAL BRUISING OR BLEEDING  TENDERNESS IN MOUTH AND THROAT WITH OR WITHOUT PRESENCE OF ULCERS  *URINARY PROBLEMS  *BOWEL PROBLEMS  UNUSUAL RASH Items with * indicate a potential emergency and should be followed up as soon as possible.  Feel free to call the clinic should you have any questions or concerns. The clinic phone number is [phone].  Please show the [facility] [facility] at check-in to the Emergency Department and triage nurse.

## 2017-05-19 ENCOUNTER — Ambulatory Visit: Payer: 59

## 2017-05-19 ENCOUNTER — Other Ambulatory Visit: Payer: 59

## 2017-05-21 ENCOUNTER — Encounter: Payer: Self-pay | Admitting: Genetics

## 2017-05-26 ENCOUNTER — Other Ambulatory Visit: Payer: 59

## 2017-05-26 ENCOUNTER — Ambulatory Visit: Payer: 59

## 2017-05-26 MED FILL — PREVIDENT 5000 BOOSTER PLUS: 1.1 | 30 days supply | Qty: 100 | Fill #0

## 2017-05-29 ENCOUNTER — Ambulatory Visit (HOSPITAL_BASED_OUTPATIENT_CLINIC_OR_DEPARTMENT_OTHER): Payer: 59 | Admitting: Adult Health

## 2017-05-29 ENCOUNTER — Other Ambulatory Visit (HOSPITAL_BASED_OUTPATIENT_CLINIC_OR_DEPARTMENT_OTHER): Payer: 59

## 2017-05-29 ENCOUNTER — Telehealth: Payer: Self-pay | Admitting: Adult Health

## 2017-05-29 ENCOUNTER — Other Ambulatory Visit: Payer: Self-pay | Admitting: Oncology

## 2017-05-29 ENCOUNTER — Ambulatory Visit (HOSPITAL_BASED_OUTPATIENT_CLINIC_OR_DEPARTMENT_OTHER): Payer: 59

## 2017-05-29 ENCOUNTER — Ambulatory Visit: Payer: 59

## 2017-05-29 ENCOUNTER — Encounter: Payer: Self-pay | Admitting: Adult Health

## 2017-05-29 VITALS — BP 138/102 | HR 103 | Temp 98.5°F | Resp 18 | Ht 66.0 in | Wt 298.4 lb

## 2017-05-29 DIAGNOSIS — R51 Headache: Secondary | ICD-10-CM

## 2017-05-29 DIAGNOSIS — C50812 Malignant neoplasm of overlapping sites of left female breast: Secondary | ICD-10-CM

## 2017-05-29 DIAGNOSIS — Z5111 Encounter for antineoplastic chemotherapy: Secondary | ICD-10-CM

## 2017-05-29 DIAGNOSIS — Z170 Estrogen receptor positive status [ER+]: Principal | ICD-10-CM

## 2017-05-29 DIAGNOSIS — C773 Secondary and unspecified malignant neoplasm of axilla and upper limb lymph nodes: Secondary | ICD-10-CM

## 2017-05-29 DIAGNOSIS — C50912 Malignant neoplasm of unspecified site of left female breast: Secondary | ICD-10-CM

## 2017-05-29 DIAGNOSIS — Z95828 Presence of other vascular implants and grafts: Secondary | ICD-10-CM

## 2017-05-29 LAB — COMPREHENSIVE METABOLIC PANEL
ALBUMIN: 3.7 g/dL (ref 3.5–5.0)
ALK PHOS: 101 U/L (ref 40–150)
ALT: 27 U/L (ref 0–55)
ANION GAP: 8 meq/L (ref 3–11)
AST: 22 U/L (ref 5–34)
BUN: 13.8 mg/dL (ref 7.0–26.0)
CALCIUM: 9.2 mg/dL (ref 8.4–10.4)
CHLORIDE: 104 meq/L (ref 98–109)
CO2: 23 mEq/L (ref 22–29)
Creatinine: 0.9 mg/dL (ref 0.6–1.1)
Glucose: 184 mg/dl — ABNORMAL HIGH (ref 70–140)
POTASSIUM: 3.5 meq/L (ref 3.5–5.1)
Sodium: 135 mEq/L — ABNORMAL LOW (ref 136–145)
Total Bilirubin: 0.23 mg/dL (ref 0.20–1.20)
Total Protein: 7 g/dL (ref 6.4–8.3)

## 2017-05-29 LAB — CBC WITH DIFFERENTIAL/PLATELET
BASO%: 0.4 % (ref 0.0–2.0)
BASOS ABS: 0 10*3/uL (ref 0.0–0.1)
EOS%: 1.1 % (ref 0.0–7.0)
Eosinophils Absolute: 0.1 10*3/uL (ref 0.0–0.5)
HEMATOCRIT: 31.9 % — AB (ref 34.8–46.6)
HEMOGLOBIN: 10.3 g/dL — AB (ref 11.6–15.9)
LYMPH#: 1.2 10*3/uL (ref 0.9–3.3)
LYMPH%: 21.7 % (ref 14.0–49.7)
MCH: 27.8 pg (ref 25.1–34.0)
MCHC: 32.3 g/dL (ref 31.5–36.0)
MCV: 86 fL (ref 79.5–101.0)
MONO#: 0.7 10*3/uL (ref 0.1–0.9)
MONO%: 12.1 % (ref 0.0–14.0)
NEUT#: 3.5 10*3/uL (ref 1.5–6.5)
NEUT%: 64.7 % (ref 38.4–76.8)
PLATELETS: 285 10*3/uL (ref 145–400)
RBC: 3.71 10*6/uL (ref 3.70–5.45)
RDW: 16.9 % — ABNORMAL HIGH (ref 11.2–14.5)
WBC: 5.5 10*3/uL (ref 3.9–10.3)
nRBC: 0 % (ref 0–0)

## 2017-05-29 MED ORDER — DEXAMETHASONE SODIUM PHOSPHATE 10 MG/ML IJ SOLN
10.0000 mg | Freq: Once | INTRAMUSCULAR | Status: AC
  Administered 2017-05-29: 10 mg via INTRAVENOUS

## 2017-05-29 MED ORDER — SODIUM CHLORIDE 0.9 % IV SOLN
Freq: Once | INTRAVENOUS | Status: AC
  Administered 2017-05-29: 14:00:00 via INTRAVENOUS

## 2017-05-29 MED ORDER — SODIUM CHLORIDE 0.9% FLUSH
10.0000 mL | INTRAVENOUS | Status: DC | PRN
  Filled 2017-05-29: qty 10

## 2017-05-29 MED ORDER — GEMCITABINE HCL CHEMO INJECTION 1 GM/26.3ML
2000.0000 mg | Freq: Once | INTRAVENOUS | Status: AC
  Administered 2017-05-29: 2000 mg via INTRAVENOUS
  Filled 2017-05-29: qty 52.6

## 2017-05-29 MED ORDER — PALONOSETRON HCL INJECTION 0.25 MG/5ML
INTRAVENOUS | Status: AC
  Filled 2017-05-29: qty 5

## 2017-05-29 MED ORDER — SODIUM CHLORIDE 0.9% FLUSH
10.0000 mL | INTRAVENOUS | Status: DC | PRN
  Administered 2017-05-29: 10 mL via INTRAVENOUS
  Filled 2017-05-29: qty 10

## 2017-05-29 MED ORDER — DEXAMETHASONE SODIUM PHOSPHATE 10 MG/ML IJ SOLN
INTRAMUSCULAR | Status: AC
  Filled 2017-05-29: qty 1

## 2017-05-29 MED ORDER — HEPARIN SOD (PORK) LOCK FLUSH 100 UNIT/ML IV SOLN
500.0000 [IU] | Freq: Once | INTRAVENOUS | Status: DC | PRN
  Filled 2017-05-29: qty 5

## 2017-05-29 MED ORDER — PALONOSETRON HCL INJECTION 0.25 MG/5ML
0.2500 mg | Freq: Once | INTRAVENOUS | Status: AC
  Administered 2017-05-29: 0.25 mg via INTRAVENOUS

## 2017-05-29 MED ORDER — SODIUM CHLORIDE 0.9 % IV SOLN
300.0000 mg | Freq: Once | INTRAVENOUS | Status: AC
  Administered 2017-05-29: 300 mg via INTRAVENOUS
  Filled 2017-05-29: qty 30

## 2017-05-29 NOTE — Patient Instructions (Signed)
[facility] Discharge Instructions for Patients Receiving Chemotherapy  Today you received the following chemotherapy agents Gemzar and Carboplatin   To help prevent nausea and vomiting after your treatment, we encourage you to take your nausea medication as directed.    If you develop nausea and vomiting that is not controlled by your nausea medication, call the clinic.   BELOW ARE SYMPTOMS THAT SHOULD BE REPORTED IMMEDIATELY:  *FEVER GREATER THAN 100.5 F  *CHILLS WITH OR WITHOUT FEVER  NAUSEA AND VOMITING THAT IS NOT CONTROLLED WITH YOUR NAUSEA MEDICATION  *UNUSUAL SHORTNESS OF BREATH  *UNUSUAL BRUISING OR BLEEDING  TENDERNESS IN MOUTH AND THROAT WITH OR WITHOUT PRESENCE OF ULCERS  *URINARY PROBLEMS  *BOWEL PROBLEMS  UNUSUAL RASH Items with * indicate a potential emergency and should be followed up as soon as possible.  Feel free to call the clinic should you have any questions or concerns. The clinic phone number is [phone].  Please show the CHEMO ALERT CARD at check-in to the Emergency Department and triage nurse.   

## 2017-05-29 NOTE — Progress Notes (Signed)
[name]  Telephone:(336) [phone] Fax:(336) [phone]     ID: Mackenzie [name] DOB: 1971-01-25  MR#: [id number]  BSJ#:[id number]  Patient Care Team: Willey Blade, MD as PCP - General (Internal Medicine) Magrinat, [name], MD as Consulting Physician (Oncology) Erroll Luna, MD as Consulting Physician (General Surgery) Kyung Rudd, MD as Consulting Physician (Radiation Oncology) OTHER MD:  CHIEF COMPLAINT: Estrogen receptor positive breast cancer  CURRENT TREATMENT: Neoadjuvant chemotherapy   HISTORY OF CURRENT ILLNESS: From the original intake note:  The patient herself noted some changes in her left breast late July 2018, she says, and eventually brought this to medical attention so that on 01/09/2017 she underwent bilateral diagnostic mammography with tomography and left breast ultrasonography at the breast Center. This found the breast density to be category C. In the upper inner quadrant of the left breast there was an area of asymmetry and there were malignant type calcifications involving all 4 quadrants. On exam there is firmness and palpable thickening in the anterior left breast with skin dimpling. Ultrasonography found at the 9:30 o'clock radiant 3 cm from the nipple a 2.7 cm mass and in the left axilla for abnormal lymph nodes largest of which measured 2.7 cm.  Biopsy of the left breast 9:30 o'clock mass 80 01/26/2017 showed ([id number]) invasive ductal carcinoma, with extracellular mucin, grade 1 or 2. In the lower outer left breast is separated biopsy the same day showed ductal carcinoma in situ. One of the 4 lymph nodes involved was positive for metastatic carcinoma. Prognostic panel on the invasive disease showed it to be estrogen receptor 100% positive, progesterone receptor 20% positive, both with strong staining intensity, with an MIB-1 of 20%, and no HER-2 amplification with a signals ratio 1.38 and the number per cell 2.90.  On 01/22/2017 the  patient underwent bilateral breast MRI. This showed no involvement of the right breast, but in the left breast there was a masslike and non-masslike enhancement involving all quadrants, with skin swelling but no abnormal enhancement of the skin or nipple areolar complex or pectoralis muscle. The mass could not be clearly measured but spanned approximately 12 cm. There was bulky left axillary lymphadenopathy, with the largest lymph node measuring up to 3.4 cm.  The patient's subsequent history is as detailed below.  INTERVAL HISTORY: Nalany returns today for evaluation and treatment in the setting of estrogen receptor positive breast cancer. She is accompanied by her husband. She is currently receiving neoadjuvant chemotherapy and today will receive day 1 of cycle 3 of 4 planned cycles of carboplatin and gemcitabine given on days 1 and 8 of each 21-day cycle.   REVIEW OF SYSTEMS: Mackenzie [name] is doing well.  She is tolerating treatment well.  She has occasional fleeting headaches.  They are not bad enough to take medications for, she just wants me to be aware.  She denies any other associated symptoms with these headaches such as vision changes, speech changes, focal weakness.  She does note that her headaches were worse when she was taking Ondansetron, however they are improved and these are tolerable.  Worse the first few days after chemotherapy.  She denies any other issues such as fevers, chills, nausea, vomiting, constipation , diarrhea.  A detailed ROS was otherwise non contributory today.    PAST MEDICAL HISTORY: Past Medical History:  Diagnosis Date  . Anemia   . Breast cancer ([facility]) 01/14/2017   Left breast  . Diabetes mellitus    GDM    PAST SURGICAL  HISTORY: Past Surgical History:  Procedure Laterality Date  . CESAREAN SECTION     x2  . PORTACATH PLACEMENT Right 01/28/2017   Procedure: INSERTION PORT-A-CATH WITH ULTRASOUND;  Surgeon: Erroll Luna, MD;  Location: [facility];  Service: General;  Laterality: Right;  . TUBAL LIGATION      FAMILY HISTORY Family History  Problem Relation Age of Onset  . Breast cancer Paternal Grandmother 72       d.60s from breast cancer. Did not have treatment.  . Other Mother        V.67 from complications of surgery to remove brain tumor  The patient's father still alive at age 63. The patient's mother died with a brain tumor which the patient says was "benign". She was 56. The patient has one brother, no sisters. The only breast cancer in the family is a paternal grandmother who died from breast cancer at an unknown age. There is no history of ovarian or prostate cancer in the family.  GYNECOLOGIC HISTORY:  No LMP recorded. Menarche age 26 and first live birth age 23 she is [place] P4. The person is still having regular periods as of August 2018  SOCIAL HISTORY: (As of August 2018) Sieara works at [employer] on the fifth floor, [place]. Her husband, [name], is a Building control surveyor. Daughter and married and lives in [place] and works as a Education administrator. Daughter, [name] lives in [place] and works in the police department. Daughters, [place] and [place] are 82 and 6, living at home. The patient has no grandchildren. She attends a Tour manager   ADVANCED DIRECTIVES: Not in place   HEALTH MAINTENANCE: Social History   Tobacco Use  . Smoking status: Never Smoker  . Smokeless tobacco: Never Used  Substance Use Topics  . Alcohol use: Yes    Comment: social  . Drug use: No     Colonoscopy: Never  PAP: December 2017  Bone density: Never   No Known Allergies  Current Outpatient Medications  Medication Sig Dispense Refill  . B Complex Vitamins (VITAMIN B COMPLEX PO) Take 1 capsule by mouth daily.    . benzonatate (TESSALON) 100 MG capsule Take 1 capsule by mouth every 8 (eight) hours as needed.  0  . cetirizine (ZYRTEC) 10 MG tablet Take 10 mg by mouth daily.      . Cholecalciferol (VITAMIN D PO) Take  daily by mouth.    . CRANBERRY PO Take 1 tablet by mouth 2 (two) times daily.    Marland Kitchen loratadine (CLARITIN) 10 MG tablet Take 1 tablet (10 mg total) by mouth daily. 90 tablet 2  . LORazepam (ATIVAN) 0.5 MG tablet Take 1 tablet (0.5 mg total) by mouth every 8 (eight) hours as needed for anxiety. 30 tablet 0  . montelukast (SINGULAIR) 10 MG tablet Take 10 mg by mouth at bedtime. prn    . Multiple Vitamin (MULTIVITAMIN) tablet Take 1 tablet by mouth daily.    Marland Kitchen nystatin (MYCOSTATIN) 100000 UNIT/ML suspension Take 5 mLs (500,000 Units total) by mouth 4 (four) times daily. 240 mL 0  . Omega-3 Fatty Acids (FISH OIL) 1000 MG CAPS Take 1,000 mg by mouth 2 (two) times daily.     Marland Kitchen PREVIDENT 5000 BOOSTER PLUS 1.1 % PSTE BRUSH ONCE A DAY IN PLACE OF REGULAR TOOTHPASTE  2  . prochlorperazine (COMPAZINE) 10 MG tablet Take by mouth.  1  . prochlorperazine (COMPAZINE) 10 MG tablet TAKE 1 TABLET (10 MG TOTAL) BY MOUTH EVERY 6 (SIX)  HOURS AS NEEDED (NAUSEA OR VOMITING). 30 tablet 1  . terbinafine (LAMISIL) 250 MG tablet Take 1 tablet (250 mg total) daily by mouth. 90 tablet 0   No current facility-administered medications for this visit.    Facility-Administered Medications Ordered in Other Visits  Medication Dose Route Frequency Provider Last Rate Last Dose  . sodium chloride flush (NS) 0.9 % injection 10 mL  10 mL Intravenous PRN Magrinat, [name], MD   10 mL at 03/10/17 1239    OBJECTIVE:   Vitals:   05/29/17 1258  BP: (!) 138/102  Pulse: (!) 103  Resp: 18  Temp: 98.5 F (36.9 C)  SpO2: 100%     Body mass index is 48.16 kg/m.   Wt Readings from Last 3 Encounters:  05/29/17 298 lb 6.4 oz (135.4 kg)  05/15/17 294 lb 8 oz (133.6 kg)  05/15/17 294 lb 8 oz (133.6 kg)  ECOG FS:0 - Asymptomatic  GENERAL: Patient is a well appearing female in no acute distress HEENT:  Sclerae anicteric.  Oropharynx clear and moist. No ulcerations or evidence of oropharyngeal candidiasis. Neck is supple.  NODES:  No  cervical, supraclavicular, or axillary lymphadenopathy palpated.  BREAST EXAM:  Deferred. LUNGS:  Clear to auscultation bilaterally.  No wheezes or rhonchi. HEART:  Regular rate and rhythm. No murmur appreciated. ABDOMEN:  Soft, nontender.  Positive, normoactive bowel sounds. No organomegaly palpated. MSK:  No focal spinal tenderness to palpation. Full range of motion bilaterally in the upper extremities. EXTREMITIES:  No peripheral edema.   SKIN:  Clear with no obvious rashes or skin changes. No nail dyscrasia. NEURO:  Nonfocal. Well oriented.  Appropriate affect.    LAB RESULTS:    Appointment on 05/29/2017  Component Date Value Ref Range Status  . WBC 05/29/2017 5.5  3.9 - 10.3 10e3/uL Final  . NEUT# 05/29/2017 3.5  1.5 - 6.5 10e3/uL Final  . HGB 05/29/2017 10.3* 11.6 - 15.9 g/dL Final  . HCT 05/29/2017 31.9* 34.8 - 46.6 % Final  . Platelets 05/29/2017 285  145 - 400 10e3/uL Final  . MCV 05/29/2017 86.0  79.5 - 101.0 fL Final  . MCH 05/29/2017 27.8  25.1 - 34.0 pg Final  . MCHC 05/29/2017 32.3  31.5 - 36.0 g/dL Final  . RBC 05/29/2017 3.71  3.70 - 5.45 10e6/uL Final  . RDW 05/29/2017 16.9* 11.2 - 14.5 % Final  . lymph# 05/29/2017 1.2  0.9 - 3.3 10e3/uL Final  . MONO# 05/29/2017 0.7  0.1 - 0.9 10e3/uL Final  . Eosinophils Absolute 05/29/2017 0.1  0.0 - 0.5 10e3/uL Final  . Basophils Absolute 05/29/2017 0.0  0.0 - 0.1 10e3/uL Final  . NEUT% 05/29/2017 64.7  38.4 - 76.8 % Final  . LYMPH% 05/29/2017 21.7  14.0 - 49.7 % Final  . MONO% 05/29/2017 12.1  0.0 - 14.0 % Final  . EOS% 05/29/2017 1.1  0.0 - 7.0 % Final  . BASO% 05/29/2017 0.4  0.0 - 2.0 % Final  . nRBC 05/29/2017 0  0 - 0 % Final       STUDIES: No results found.   ELIGIBLE FOR AVAILABLE RESEARCH PROTOCOL: no  ASSESSMENT: 46 y.o. [place] woman status post left breast upper inner quadrant biopsy 01/14/2017 for a clinical T3 N2, stage IIA invasive ductal carcinoma, grade 1 or 2, estrogen and progesterone  receptor positive, HER-2 nonamplified, with an MIB-1 of 20%.  (1) staging studies: Brain MRI, bone scan, and CT scan of the chest 02/05/2017 showed no brain  lesions, no lung or liver lesions, a 4.9 cm mass in the left breast with left axillary and subpectoral adenopathy, and nonspecific bone scan tracer at L2, left scapula, and anterior ribs, with lumbar spine MRI suggested for further evaluation.  (2) neoadjuvant chemotherapy consisting of cyclophosphamide and doxorubicin in dose dense fashion 4 starting 02/10/2017, completed 03/24/2017, to be followed by weekly carboplatin and gemcitabine given days 1 and 8 of each 21-day cycle starting 04/14/2017  (3) definitive surgery to follow, most likely left modified radical mastectomy  (4) postmastectomy radiation is indicated  (5) adjuvant anti-estrogens to follow  (6) genetics testing 03/24/2017 through the Common Hereditary Cancer Panel offered by [facility] found no deleterious mutations in APC, ATM, AXIN2, BARD1, BMPR1A, BRCA1, BRCA2, BRIP1, CDH1, CDKN2A (p14ARF), CDKN2A (p16INK4a), CHEK2, CTNNA1, DICER1, EPCAM (Deletion/duplication testing only), GREM1 (promoter region deletion/duplication testing only), KIT, MEN1, MLH1, MSH2, MSH3, MSH6, MUTYH, NBN, NF1, NHTL1, PALB2, PDGFRA, PMS2, POLD1, POLE, PTEN, RAD50, RAD51C, RAD51D, SDHB, SDHC, SDHD, SMAD4, SMARCA4. STK11, TP53, TSC1, TSC2, and VHL.  The following genes were evaluated for sequence changes only: SDHA and HOXB13 c.251G>A variant only.    PLAN: Victorine is doing well today.  Her labs are normal, I reviewed them with her in detail.  She will proceed with chemotherapy today.  She will return in one week, for labs, f/u and day 8 of treatment.  We talked about how I am suspicious that her headaches are related to the [place].  She has no other symptoms, for now we will treat symptomatically, however if they worsen or if she develops associated symptoms we will likely need to image her brain.    We will  see her next week.  She knows to call between now and then for any questions or concerns.    A total of (30) minutes of face-to-face time was spent with this patient with greater than 50% of that time in counseling and care-coordination.  [name], NP  05/29/17 1:10 PM Medical Oncology and [facility] [address] [place], [place] [address] Tel. [phone]    Fax. [phone]

## 2017-05-29 NOTE — Telephone Encounter (Signed)
Gave patient AVs and calendar of upcoming December appointments.

## 2017-06-03 ENCOUNTER — Other Ambulatory Visit: Payer: 59

## 2017-06-03 ENCOUNTER — Ambulatory Visit: Payer: 59

## 2017-06-05 ENCOUNTER — Ambulatory Visit (HOSPITAL_BASED_OUTPATIENT_CLINIC_OR_DEPARTMENT_OTHER): Payer: 59 | Admitting: Adult Health

## 2017-06-05 ENCOUNTER — Ambulatory Visit (HOSPITAL_BASED_OUTPATIENT_CLINIC_OR_DEPARTMENT_OTHER): Payer: 59

## 2017-06-05 ENCOUNTER — Telehealth: Payer: Self-pay | Admitting: Adult Health

## 2017-06-05 ENCOUNTER — Ambulatory Visit: Payer: 59

## 2017-06-05 ENCOUNTER — Encounter: Payer: Self-pay | Admitting: Adult Health

## 2017-06-05 ENCOUNTER — Other Ambulatory Visit (HOSPITAL_BASED_OUTPATIENT_CLINIC_OR_DEPARTMENT_OTHER): Payer: 59

## 2017-06-05 VITALS — BP 144/74 | HR 103 | Temp 97.6°F | Resp 17 | Ht 66.0 in | Wt 298.4 lb

## 2017-06-05 DIAGNOSIS — Z170 Estrogen receptor positive status [ER+]: Secondary | ICD-10-CM

## 2017-06-05 DIAGNOSIS — C773 Secondary and unspecified malignant neoplasm of axilla and upper limb lymph nodes: Secondary | ICD-10-CM

## 2017-06-05 DIAGNOSIS — C50812 Malignant neoplasm of overlapping sites of left female breast: Secondary | ICD-10-CM

## 2017-06-05 DIAGNOSIS — R51 Headache: Secondary | ICD-10-CM | POA: Diagnosis not present

## 2017-06-05 DIAGNOSIS — Z95828 Presence of other vascular implants and grafts: Secondary | ICD-10-CM

## 2017-06-05 DIAGNOSIS — Z5111 Encounter for antineoplastic chemotherapy: Secondary | ICD-10-CM | POA: Diagnosis not present

## 2017-06-05 DIAGNOSIS — C50912 Malignant neoplasm of unspecified site of left female breast: Secondary | ICD-10-CM

## 2017-06-05 LAB — COMPREHENSIVE METABOLIC PANEL
ALBUMIN: 3.7 g/dL (ref 3.5–5.0)
ALK PHOS: 95 U/L (ref 40–150)
ALT: 28 U/L (ref 0–55)
ANION GAP: 8 meq/L (ref 3–11)
AST: 19 U/L (ref 5–34)
BILIRUBIN TOTAL: 0.26 mg/dL (ref 0.20–1.20)
BUN: 12 mg/dL (ref 7.0–26.0)
CO2: 25 meq/L (ref 22–29)
Calcium: 9.3 mg/dL (ref 8.4–10.4)
Chloride: 100 mEq/L (ref 98–109)
Creatinine: 0.9 mg/dL (ref 0.6–1.1)
Glucose: 278 mg/dl — ABNORMAL HIGH (ref 70–140)
POTASSIUM: 3.5 meq/L (ref 3.5–5.1)
Sodium: 134 mEq/L — ABNORMAL LOW (ref 136–145)
TOTAL PROTEIN: 7.2 g/dL (ref 6.4–8.3)

## 2017-06-05 LAB — CBC WITH DIFFERENTIAL/PLATELET
BASO%: 0.9 % (ref 0.0–2.0)
BASOS ABS: 0 10*3/uL (ref 0.0–0.1)
EOS ABS: 0 10*3/uL (ref 0.0–0.5)
EOS%: 0.6 % (ref 0.0–7.0)
HCT: 33 % — ABNORMAL LOW (ref 34.8–46.6)
HGB: 10.6 g/dL — ABNORMAL LOW (ref 11.6–15.9)
LYMPH%: 22.9 % (ref 14.0–49.7)
MCH: 27.8 pg (ref 25.1–34.0)
MCHC: 32.1 g/dL (ref 31.5–36.0)
MCV: 86.6 fL (ref 79.5–101.0)
MONO#: 0.3 10*3/uL (ref 0.1–0.9)
MONO%: 10.1 % (ref 0.0–14.0)
NEUT%: 65.5 % (ref 38.4–76.8)
NEUTROS ABS: 2.2 10*3/uL (ref 1.5–6.5)
PLATELETS: 259 10*3/uL (ref 145–400)
RBC: 3.81 10*6/uL (ref 3.70–5.45)
RDW: 16 % — ABNORMAL HIGH (ref 11.2–14.5)
WBC: 3.3 10*3/uL — AB (ref 3.9–10.3)
lymph#: 0.8 10*3/uL — ABNORMAL LOW (ref 0.9–3.3)

## 2017-06-05 MED ORDER — SODIUM CHLORIDE 0.9 % IV SOLN
2000.0000 mg | Freq: Once | INTRAVENOUS | Status: AC
  Administered 2017-06-05: 2000 mg via INTRAVENOUS
  Filled 2017-06-05: qty 52.6

## 2017-06-05 MED ORDER — HEPARIN SOD (PORK) LOCK FLUSH 100 UNIT/ML IV SOLN
500.0000 [IU] | Freq: Once | INTRAVENOUS | Status: AC | PRN
  Administered 2017-06-05: 500 [IU]
  Filled 2017-06-05: qty 5

## 2017-06-05 MED ORDER — SODIUM CHLORIDE 0.9 % IV SOLN
Freq: Once | INTRAVENOUS | Status: AC
  Administered 2017-06-05: 12:00:00 via INTRAVENOUS

## 2017-06-05 MED ORDER — SODIUM CHLORIDE 0.9% FLUSH
10.0000 mL | INTRAVENOUS | Status: DC | PRN
  Administered 2017-06-05: 10 mL
  Filled 2017-06-05: qty 10

## 2017-06-05 MED ORDER — SODIUM CHLORIDE 0.9% FLUSH
10.0000 mL | INTRAVENOUS | Status: DC | PRN
  Administered 2017-06-05: 10 mL via INTRAVENOUS
  Filled 2017-06-05: qty 10

## 2017-06-05 MED ORDER — FLUCONAZOLE 150 MG PO TABS: 150.0000 mg | ORAL_TABLET | Freq: Every day | ORAL | 0 refills | Status: DC

## 2017-06-05 MED ORDER — PROCHLORPERAZINE EDISYLATE 5 MG/ML IJ SOLN
INTRAMUSCULAR | Status: AC
  Filled 2017-06-05: qty 2

## 2017-06-05 MED ORDER — PROCHLORPERAZINE EDISYLATE 5 MG/ML IJ SOLN
10.0000 mg | Freq: Once | INTRAMUSCULAR | Status: AC
  Administered 2017-06-05: 10 mg via INTRAVENOUS

## 2017-06-05 MED ORDER — DEXAMETHASONE SODIUM PHOSPHATE 10 MG/ML IJ SOLN
10.0000 mg | Freq: Once | INTRAMUSCULAR | Status: AC
  Administered 2017-06-05: 10 mg via INTRAVENOUS

## 2017-06-05 MED ORDER — PROCHLORPERAZINE MALEATE 10 MG PO TABS
ORAL_TABLET | ORAL | Status: AC
  Filled 2017-06-05: qty 1

## 2017-06-05 MED ORDER — DEXAMETHASONE SODIUM PHOSPHATE 10 MG/ML IJ SOLN
INTRAMUSCULAR | Status: AC
  Filled 2017-06-05: qty 1

## 2017-06-05 MED ORDER — SODIUM CHLORIDE 0.9 % IV SOLN
300.0000 mg | Freq: Once | INTRAVENOUS | Status: AC
  Administered 2017-06-05: 300 mg via INTRAVENOUS
  Filled 2017-06-05: qty 30

## 2017-06-05 MED FILL — FLUCONAZOLE 150 MG TABLET: 150 | 2 days supply | Qty: 2 | Fill #0

## 2017-06-05 NOTE — Progress Notes (Signed)
[place]  [phone] [phone]     ID: [name] DOB: 06/20/1970  MR#: [id number]  GYB#:[id number]  Patient Care Team: Willey Blade, MD as PCP - General (Internal Medicine) Magrinat, [name], MD as Consulting Physician (Oncology) Erroll Luna, MD as Consulting Physician (General Surgery) Kyung Rudd, MD as Consulting Physician (Radiation Oncology) OTHER MD:  CHIEF COMPLAINT: Estrogen receptor positive breast cancer  CURRENT TREATMENT: Neoadjuvant chemotherapy   HISTORY OF CURRENT ILLNESS: From the original intake note:  The patient herself noted some changes in her left breast late July 2018, she says, and eventually brought this to medical attention so that on 01/09/2017 she underwent bilateral diagnostic mammography with tomography and left breast ultrasonography at the breast Center. This found the breast density to be category C. In the upper inner quadrant of the left breast there was an area of asymmetry and there were malignant type calcifications involving all 4 quadrants. On exam there is firmness and palpable thickening in the anterior left breast with skin dimpling. Ultrasonography found at the 9:30 o'clock radiant 3 cm from the nipple a 2.7 cm mass and in the left axilla for abnormal lymph nodes largest of which measured 2.7 cm.  Biopsy of the left breast 9:30 o'clock mass 80 01/26/2017 showed ([id number]) invasive ductal carcinoma, with extracellular mucin, grade 1 or 2. In the lower outer left breast is separated biopsy the same day showed ductal carcinoma in situ. One of the 4 lymph nodes involved was positive for metastatic carcinoma. Prognostic panel on the invasive disease showed it to be estrogen receptor 100% positive, progesterone receptor 20% positive, both with strong staining intensity, with an MIB-1 of 20%, and no HER-2 amplification with a signals ratio 1.38 and the number per cell 2.90.  On 01/22/2017 the  patient underwent bilateral breast MRI. This showed no involvement of the right breast, but in the left breast there was a masslike and non-masslike enhancement involving all quadrants, with skin swelling but no abnormal enhancement of the skin or nipple areolar complex or pectoralis muscle. The mass could not be clearly measured but spanned approximately 12 cm. There was bulky left axillary lymphadenopathy, with the largest lymph node measuring up to 3.4 cm.  The patient's subsequent history is as detailed below.  INTERVAL HISTORY: Tishina returns today for evaluation and treatment in the setting of estrogen receptor positive breast cancer. She is accompanied by her husband. She is currently receiving neoadjuvant chemotherapy and today will receive day 8 of cycle 3 of 4 planned cycles of carboplatin and gemcitabine given on days 1 and 8 of each 21-day cycle.   REVIEW OF SYSTEMS: Debarah is doing well.  She is tolerating treatment well.  She is continuing to have headaches fater her treatment.  She noted they got better after she stopped taking the Zofran, however they are present the first few days following chemotherapy.  Her Aloxi has not yet been discontinued.  She is slightly fatigued, but notes that she had a good Christmas.  She is concerned because her MRI has not yet been scheduled.  It is ordered for about 07/01/2017.  Otherwise, a detailed ROS is non contributory.    PAST MEDICAL HISTORY: Past Medical History:  Diagnosis Date  . Anemia   . Breast cancer ([facility]) 01/14/2017   Left breast  . Diabetes mellitus    GDM    PAST SURGICAL HISTORY: Past Surgical History:  Procedure Laterality Date  . CESAREAN SECTION  x2  . PORTACATH PLACEMENT Right 01/28/2017   Procedure: INSERTION PORT-A-CATH WITH ULTRASOUND;  Surgeon: Erroll Luna, MD;  Location: [facility];  Service: General;  Laterality: Right;  . TUBAL LIGATION      FAMILY HISTORY Family History  Problem  Relation Age of Onset  . Breast cancer Paternal Grandmother 69       d.60s from breast cancer. Did not have treatment.  . Other Mother        K.02 from complications of surgery to remove brain tumor  The patient's father still alive at age 48. The patient's mother died with a brain tumor which the patient says was "benign". She was 48. The patient has one brother, no sisters. The only breast cancer in the family is a paternal grandmother who died from breast cancer at an unknown age. There is no history of ovarian or prostate cancer in the family.  GYNECOLOGIC HISTORY:  No LMP recorded. Menarche age 46 and first live birth age 46 she is [place] P4. The person is still having regular periods as of August 2018  SOCIAL HISTORY: (As of August 2018) Piccola works at [employer] on the fifth floor, [place]. Her husband, [name], is a Building control surveyor. Daughter and married and lives in [place] and works as a Education administrator. Daughter, [name] lives in [place] and works in the police department. Daughters, [place] and [place] are 48 and 6, living at home. The patient has no grandchildren. She attends a Tour manager   ADVANCED DIRECTIVES: Not in place   HEALTH MAINTENANCE: Social History   Tobacco Use  . Smoking status: Never Smoker  . Smokeless tobacco: Never Used  Substance Use Topics  . Alcohol use: Yes    Comment: social  . Drug use: No     Colonoscopy: Never  PAP: December 2017  Bone density: Never   No Known Allergies  Current Outpatient Medications  Medication Sig Dispense Refill  . B Complex Vitamins (VITAMIN B COMPLEX PO) Take 1 capsule by mouth daily.    . benzonatate (TESSALON) 100 MG capsule Take 1 capsule by mouth every 8 (eight) hours as needed.  0  . cetirizine (ZYRTEC) 10 MG tablet Take 10 mg by mouth daily.      . Cholecalciferol (VITAMIN D PO) Take daily by mouth.    . CRANBERRY PO Take 1 tablet by mouth 2 (two) times daily.    Marland Kitchen loratadine (CLARITIN) 10 MG  tablet Take 1 tablet (10 mg total) by mouth daily. 90 tablet 2  . LORazepam (ATIVAN) 0.5 MG tablet Take 1 tablet (0.5 mg total) by mouth every 8 (eight) hours as needed for anxiety. 30 tablet 0  . montelukast (SINGULAIR) 10 MG tablet Take 10 mg by mouth at bedtime. prn    . Multiple Vitamin (MULTIVITAMIN) tablet Take 1 tablet by mouth daily.    Marland Kitchen nystatin (MYCOSTATIN) 100000 UNIT/ML suspension Take 5 mLs (500,000 Units total) by mouth 4 (four) times daily. 240 mL 0  . Omega-3 Fatty Acids (FISH OIL) 1000 MG CAPS Take 1,000 mg by mouth 2 (two) times daily.     Marland Kitchen PREVIDENT 5000 BOOSTER PLUS 1.1 % PSTE BRUSH ONCE A DAY IN PLACE OF REGULAR TOOTHPASTE  2  . prochlorperazine (COMPAZINE) 10 MG tablet Take by mouth.  1  . prochlorperazine (COMPAZINE) 10 MG tablet TAKE 1 TABLET (10 MG TOTAL) BY MOUTH EVERY 6 (SIX) HOURS AS NEEDED (NAUSEA OR VOMITING). 30 tablet 1  . terbinafine (LAMISIL) 250 MG tablet  Take 1 tablet (250 mg total) daily by mouth. 90 tablet 0   No current facility-administered medications for this visit.    Facility-Administered Medications Ordered in Other Visits  Medication Dose Route Frequency Provider Last Rate Last Dose  . heparin lock flush 100 unit/mL  500 Units Intracatheter Once PRN Magrinat, [name], MD      . sodium chloride flush (NS) 0.9 % injection 10 mL  10 mL Intravenous PRN Magrinat, [name], MD   10 mL at 03/10/17 1239  . sodium chloride flush (NS) 0.9 % injection 10 mL  10 mL Intracatheter PRN Magrinat, [name], MD        OBJECTIVE:   Vitals:   06/05/17 1120  BP: (!) 144/74  Pulse: (!) 103  Resp: 17  Temp: 97.6 F (36.4 C)  SpO2: 100%     Body mass index is 48.16 kg/m.   Wt Readings from Last 3 Encounters:  06/05/17 298 lb 6.4 oz (135.4 kg)  05/29/17 298 lb 6.4 oz (135.4 kg)  05/15/17 294 lb 8 oz (133.6 kg)  ECOG FS:1  GENERAL: Patient is a well appearing female in no acute distress HEENT:  Sclerae anicteric.  Oropharynx clear and moist. No ulcerations  or evidence of oropharyngeal candidiasis. Neck is supple.  NODES:  No cervical, supraclavicular, or axillary lymphadenopathy palpated.  BREAST EXAM:  Deferred. LUNGS:  Clear to auscultation bilaterally.  No wheezes or rhonchi. HEART:  Regular rate and rhythm. No murmur appreciated. ABDOMEN:  Soft, nontender.  Positive, normoactive bowel sounds. No organomegaly palpated. MSK:  No focal spinal tenderness to palpation. Full range of motion bilaterally in the upper extremities. EXTREMITIES:  No peripheral edema.   SKIN:  Clear with no obvious rashes or skin changes. No nail dyscrasia. NEURO:  Nonfocal. Well oriented.  Appropriate affect.    LAB RESULTS:    Appointment on 06/05/2017  Component Date Value Ref Range Status  . WBC 06/05/2017 3.3* 3.9 - 10.3 10e3/uL Final  . NEUT# 06/05/2017 2.2  1.5 - 6.5 10e3/uL Final  . HGB 06/05/2017 10.6* 11.6 - 15.9 g/dL Final  . HCT 06/05/2017 33.0* 34.8 - 46.6 % Final  . Platelets 06/05/2017 259  145 - 400 10e3/uL Final  . MCV 06/05/2017 86.6  79.5 - 101.0 fL Final  . MCH 06/05/2017 27.8  25.1 - 34.0 pg Final  . MCHC 06/05/2017 32.1  31.5 - 36.0 g/dL Final  . RBC 06/05/2017 3.81  3.70 - 5.45 10e6/uL Final  . RDW 06/05/2017 16.0* 11.2 - 14.5 % Final  . lymph# 06/05/2017 0.8* 0.9 - 3.3 10e3/uL Final  . MONO# 06/05/2017 0.3  0.1 - 0.9 10e3/uL Final  . Eosinophils Absolute 06/05/2017 0.0  0.0 - 0.5 10e3/uL Final  . Basophils Absolute 06/05/2017 0.0  0.0 - 0.1 10e3/uL Final  . NEUT% 06/05/2017 65.5  38.4 - 76.8 % Final  . LYMPH% 06/05/2017 22.9  14.0 - 49.7 % Final  . MONO% 06/05/2017 10.1  0.0 - 14.0 % Final  . EOS% 06/05/2017 0.6  0.0 - 7.0 % Final  . BASO% 06/05/2017 0.9  0.0 - 2.0 % Final       STUDIES: No results found.   ELIGIBLE FOR AVAILABLE RESEARCH PROTOCOL: no  ASSESSMENT: 46 y.o. [place] woman status post left breast upper inner quadrant biopsy 01/14/2017 for a clinical T3 N2, stage IIA invasive ductal carcinoma, grade 1 or 2,  estrogen and progesterone receptor positive, HER-2 nonamplified, with an MIB-1 of 20%.  (1) staging studies:  Brain MRI, bone scan, and CT scan of the chest 02/05/2017 showed no brain lesions, no lung or liver lesions, a 4.9 cm mass in the left breast with left axillary and subpectoral adenopathy, and nonspecific bone scan tracer at L2, left scapula, and anterior ribs, with lumbar spine MRI suggested for further evaluation.  (2) neoadjuvant chemotherapy consisting of cyclophosphamide and doxorubicin in dose dense fashion 4 starting 02/10/2017, completed 03/24/2017, to be followed by weekly carboplatin and gemcitabine given days 1 and 8 of each 21-day cycle starting 04/14/2017  (3) definitive surgery to follow, most likely left modified radical mastectomy  (4) postmastectomy radiation is indicated  (5) adjuvant anti-estrogens to follow  (6) genetics testing 03/24/2017 through the Common Hereditary Cancer Panel offered by [facility] found no deleterious mutations in APC, ATM, AXIN2, BARD1, BMPR1A, BRCA1, BRCA2, BRIP1, CDH1, CDKN2A (p14ARF), CDKN2A (p16INK4a), CHEK2, CTNNA1, DICER1, EPCAM (Deletion/duplication testing only), GREM1 (promoter region deletion/duplication testing only), KIT, MEN1, MLH1, MSH2, MSH3, MSH6, MUTYH, NBN, NF1, NHTL1, PALB2, PDGFRA, PMS2, POLD1, POLE, PTEN, RAD50, RAD51C, RAD51D, SDHB, SDHC, SDHD, SMAD4, SMARCA4. STK11, TP53, TSC1, TSC2, and VHL.  The following genes were evaluated for sequence changes only: SDHA and HOXB13 c.251G>A variant only.    PLAN: Chardai is doing well today.  I reviewed her labs with her in detail.  They are stable today.  She will proceed with cycle 3 day 8 of Gemcitabine/Carboplatin.  I discontinued the Aloxi with her treatment, and she and I reviewed scheduling Compazine.  She verbalized understanding with the plan.    We will see her in two weeks for labs, f/u and cycle 4 of Gemcitabine/Carboplatin.  She knows to call between now and then for any  questions or concerns.    A total of (30) minutes of face-to-face time was spent with this patient with greater than 50% of that time in counseling and care-coordination.  [name], NP  06/05/17 11:25 AM Medical Oncology and Hematology [facility] [address] [place], [place] [address] Tel. [phone]    Fax. [phone]

## 2017-06-05 NOTE — Patient Instructions (Signed)
[facility] Discharge Instructions for Patients Receiving Chemotherapy  Today you received the following chemotherapy agents Gemzar and Carboplatin   To help prevent nausea and vomiting after your treatment, we encourage you to take your nausea medication as directed.    If you develop nausea and vomiting that is not controlled by your nausea medication, call the clinic.   BELOW ARE SYMPTOMS THAT SHOULD BE REPORTED IMMEDIATELY:  *FEVER GREATER THAN 100.5 F  *CHILLS WITH OR WITHOUT FEVER  NAUSEA AND VOMITING THAT IS NOT CONTROLLED WITH YOUR NAUSEA MEDICATION  *UNUSUAL SHORTNESS OF BREATH  *UNUSUAL BRUISING OR BLEEDING  TENDERNESS IN MOUTH AND THROAT WITH OR WITHOUT PRESENCE OF ULCERS  *URINARY PROBLEMS  *BOWEL PROBLEMS  UNUSUAL RASH Items with * indicate a potential emergency and should be followed up as soon as possible.  Feel free to call the clinic should you have any questions or concerns. The clinic phone number is [phone].  Please show the CHEMO ALERT CARD at check-in to the Emergency Department and triage nurse.   

## 2017-06-05 NOTE — Telephone Encounter (Signed)
No 12/28 los.  

## 2017-06-08 ENCOUNTER — Other Ambulatory Visit: Payer: 59

## 2017-06-08 ENCOUNTER — Ambulatory Visit: Payer: 59

## 2017-06-10 ENCOUNTER — Telehealth: Payer: Self-pay

## 2017-06-10 NOTE — Telephone Encounter (Signed)
Spoke with patient to inform her that she has an upcoming MRI at [facility] on 07/01/17 at 12 pm.  She is to arrive at 11:45 am to register.  Address given to patient. Patient voiced understanding.  No questions or concerns at this time.

## 2017-06-12 ENCOUNTER — Encounter: Payer: Self-pay | Admitting: General Practice

## 2017-06-12 NOTE — Progress Notes (Signed)
[place] Progress Note Call from patient - is currently out of work and has no income. Wants information on financial assistance.  Referred to Financial Advocate to explore options via [facility] and [employer]; scheduled appt for 11/11 at 9:30 AM to complete applications for [facility] [place] and [employer].  Pt asked to research these option via internet so she can be prepared to discuss needs/eligibility w CSW during scheduled meeting.  Information packet mailed.  Edwyna Shell, LCSW Clinical Social Worker Phone:  [phone]

## 2017-06-19 ENCOUNTER — Encounter: Payer: Self-pay | Admitting: Adult Health

## 2017-06-19 ENCOUNTER — Inpatient Hospital Stay: Payer: 59 | Attending: Adult Health | Admitting: Adult Health

## 2017-06-19 ENCOUNTER — Inpatient Hospital Stay: Payer: 59

## 2017-06-19 ENCOUNTER — Encounter: Payer: Self-pay | Admitting: *Deleted

## 2017-06-19 ENCOUNTER — Ambulatory Visit (HOSPITAL_COMMUNITY)
Admission: RE | Admit: 2017-06-19 | Discharge: 2017-06-19 | Disposition: A | Discharge status: Home / Self Care | Payer: 59 | Source: Ambulatory Visit | Attending: Adult Health | Admitting: Adult Health

## 2017-06-19 ENCOUNTER — Encounter: Payer: Self-pay | Admitting: General Practice

## 2017-06-19 ENCOUNTER — Telehealth: Payer: Self-pay

## 2017-06-19 VITALS — BP 150/100 | HR 111 | Temp 98.3°F | Resp 24 | Ht 66.0 in | Wt 299.5 lb

## 2017-06-19 DIAGNOSIS — R5383 Other fatigue: Secondary | ICD-10-CM | POA: Insufficient documentation

## 2017-06-19 DIAGNOSIS — Z170 Estrogen receptor positive status [ER+]: Secondary | ICD-10-CM

## 2017-06-19 DIAGNOSIS — C773 Secondary and unspecified malignant neoplasm of axilla and upper limb lymph nodes: Secondary | ICD-10-CM | POA: Diagnosis not present

## 2017-06-19 DIAGNOSIS — C50912 Malignant neoplasm of unspecified site of left female breast: Secondary | ICD-10-CM

## 2017-06-19 DIAGNOSIS — R918 Other nonspecific abnormal finding of lung field: Secondary | ICD-10-CM | POA: Diagnosis not present

## 2017-06-19 DIAGNOSIS — R0602 Shortness of breath: Secondary | ICD-10-CM

## 2017-06-19 DIAGNOSIS — C50812 Malignant neoplasm of overlapping sites of left female breast: Secondary | ICD-10-CM

## 2017-06-19 DIAGNOSIS — Z5111 Encounter for antineoplastic chemotherapy: Secondary | ICD-10-CM | POA: Diagnosis not present

## 2017-06-19 DIAGNOSIS — C50919 Malignant neoplasm of unspecified site of unspecified female breast: Secondary | ICD-10-CM | POA: Diagnosis not present

## 2017-06-19 DIAGNOSIS — Z95828 Presence of other vascular implants and grafts: Secondary | ICD-10-CM

## 2017-06-19 DIAGNOSIS — R635 Abnormal weight gain: Secondary | ICD-10-CM | POA: Diagnosis not present

## 2017-06-19 LAB — CBC WITH DIFFERENTIAL/PLATELET
BASOS ABS: 0 10*3/uL (ref 0.0–0.1)
Basophils Relative: 1 %
Eosinophils Absolute: 0 10*3/uL (ref 0.0–0.5)
Eosinophils Relative: 1 %
HCT: 32.7 % — ABNORMAL LOW (ref 34.8–46.6)
HEMOGLOBIN: 10.8 g/dL — AB (ref 11.6–15.9)
LYMPHS ABS: 0.9 10*3/uL (ref 0.9–3.3)
LYMPHS PCT: 20 %
MCH: 28.7 pg (ref 25.1–34.0)
MCHC: 33.1 g/dL (ref 31.5–36.0)
MCV: 86.7 fL (ref 79.5–101.0)
Monocytes Absolute: 0.8 10*3/uL (ref 0.1–0.9)
Monocytes Relative: 17 %
NEUTROS ABS: 2.8 10*3/uL (ref 1.5–6.5)
NEUTROS PCT: 61 %
Platelets: 305 10*3/uL (ref 145–400)
RBC: 3.77 MIL/uL (ref 3.70–5.45)
RDW: 17.2 % — ABNORMAL HIGH (ref 11.2–16.1)
WBC: 4.5 10*3/uL (ref 3.9–10.3)

## 2017-06-19 LAB — COMPREHENSIVE METABOLIC PANEL
ALBUMIN: 3.9 g/dL (ref 3.5–5.0)
ALT: 29 U/L (ref 0–55)
ANION GAP: 9 (ref 3–11)
AST: 21 U/L (ref 5–34)
Alkaline Phosphatase: 108 U/L (ref 40–150)
BUN: 13 mg/dL (ref 7–26)
CO2: 23 mmol/L (ref 22–29)
Calcium: 9.2 mg/dL (ref 8.4–10.4)
Chloride: 105 mmol/L (ref 98–109)
Creatinine, Ser: 0.87 mg/dL (ref 0.60–1.10)
GFR calc non Af Amer: >60 mL/min (ref >60)
GLUCOSE: 163 mg/dL — AB (ref 70–140)
POTASSIUM: 3.8 mmol/L (ref 3.3–4.7)
SODIUM: 137 mmol/L (ref 136–145)
Total Bilirubin: 0.3 mg/dL (ref 0.2–1.2)
Total Protein: 7.1 g/dL (ref 6.4–8.3)

## 2017-06-19 MED ORDER — SODIUM CHLORIDE 0.9% FLUSH
10.0000 mL | INTRAVENOUS | Status: DC | PRN
  Administered 2017-06-19: 10 mL
  Filled 2017-06-19: qty 10

## 2017-06-19 MED ORDER — PROCHLORPERAZINE MALEATE 10 MG PO TABS
ORAL_TABLET | ORAL | Status: AC
  Filled 2017-06-19: qty 1

## 2017-06-19 MED ORDER — GEMCITABINE HCL CHEMO INJECTION 1 GM/26.3ML
2000.0000 mg | Freq: Once | INTRAVENOUS | Status: AC
  Administered 2017-06-19: 2000 mg via INTRAVENOUS
  Filled 2017-06-19: qty 52.6

## 2017-06-19 MED ORDER — CARBOPLATIN CHEMO INJECTION 450 MG/45ML
300.0000 mg | Freq: Once | INTRAVENOUS | Status: AC
  Administered 2017-06-19: 300 mg via INTRAVENOUS
  Filled 2017-06-19: qty 30

## 2017-06-19 MED ORDER — PROCHLORPERAZINE EDISYLATE 5 MG/ML IJ SOLN
INTRAMUSCULAR | Status: AC
  Filled 2017-06-19: qty 2

## 2017-06-19 MED ORDER — HEPARIN SOD (PORK) LOCK FLUSH 100 UNIT/ML IV SOLN
500.0000 [IU] | Freq: Once | INTRAVENOUS | Status: AC | PRN
  Administered 2017-06-19: 500 [IU]
  Filled 2017-06-19: qty 5

## 2017-06-19 MED ORDER — DEXAMETHASONE SODIUM PHOSPHATE 10 MG/ML IJ SOLN
INTRAMUSCULAR | Status: AC
  Filled 2017-06-19: qty 1

## 2017-06-19 MED ORDER — PROCHLORPERAZINE EDISYLATE 5 MG/ML IJ SOLN
10.0000 mg | Freq: Once | INTRAMUSCULAR | Status: AC
  Administered 2017-06-19: 10 mg via INTRAVENOUS

## 2017-06-19 MED ORDER — DEXAMETHASONE SODIUM PHOSPHATE 10 MG/ML IJ SOLN
10.0000 mg | Freq: Once | INTRAMUSCULAR | Status: AC
  Administered 2017-06-19: 10 mg via INTRAVENOUS

## 2017-06-19 MED ORDER — SODIUM CHLORIDE 0.9 % IV SOLN
Freq: Once | INTRAVENOUS | Status: AC
  Administered 2017-06-19: 13:00:00 via INTRAVENOUS

## 2017-06-19 MED ORDER — SODIUM CHLORIDE 0.9% FLUSH
10.0000 mL | INTRAVENOUS | Status: DC | PRN
  Administered 2017-06-19: 10 mL via INTRAVENOUS
  Filled 2017-06-19: qty 10

## 2017-06-19 NOTE — Progress Notes (Signed)
[place]  [phone] [phone]     ID: Mackenzie [name] DOB: August 13, 1970  MR#: [id number]  NGE#:[id number]  Patient Care Team: Willey Blade, MD as PCP - General (Internal Medicine) Magrinat, [name], MD as Consulting Physician (Oncology) Erroll Luna, MD as Consulting Physician (General Surgery) Kyung Rudd, MD as Consulting Physician (Radiation Oncology) OTHER MD:  CHIEF COMPLAINT: Estrogen receptor positive breast cancer  CURRENT TREATMENT: Neoadjuvant chemotherapy   HISTORY OF CURRENT ILLNESS: From the original intake note:  The patient herself noted some changes in her left breast late July 2018, she says, and eventually brought this to medical attention so that on 01/09/2017 she underwent bilateral diagnostic mammography with tomography and left breast ultrasonography at the breast Center. This found the breast density to be category C. In the upper inner quadrant of the left breast there was an area of asymmetry and there were malignant type calcifications involving all 4 quadrants. On exam there is firmness and palpable thickening in the anterior left breast with skin dimpling. Ultrasonography found at the 9:30 o'clock radiant 3 cm from the nipple a 2.7 cm mass and in the left axilla for abnormal lymph nodes largest of which measured 2.7 cm.  Biopsy of the left breast 9:30 o'clock mass 80 01/26/2017 showed ([id number]) invasive ductal carcinoma, with extracellular mucin, grade 1 or 2. In the lower outer left breast is separated biopsy the same day showed ductal carcinoma in situ. One of the 4 lymph nodes involved was positive for metastatic carcinoma. Prognostic panel on the invasive disease showed it to be estrogen receptor 100% positive, progesterone receptor 20% positive, both with strong staining intensity, with an MIB-1 of 20%, and no HER-2 amplification with a signals ratio 1.38 and the number per cell 2.90.  On 01/22/2017 the  patient underwent bilateral breast MRI. This showed no involvement of the right breast, but in the left breast there was a masslike and non-masslike enhancement involving all quadrants, with skin swelling but no abnormal enhancement of the skin or nipple areolar complex or pectoralis muscle. The mass could not be clearly measured but spanned approximately 12 cm. There was bulky left axillary lymphadenopathy, with the largest lymph node measuring up to 3.4 cm.  The patient's subsequent history is as detailed below.  INTERVAL HISTORY: Mackenzie [name] returns today for evaluation and treatment in the setting of estrogen receptor positive breast cancer. She is accompanied by her husband. She is currently receiving neoadjuvant chemotherapy and today will receive day 1 of cycle 4 of 4 planned cycles of carboplatin and gemcitabine given on days 1 and 8 of each 21-day cycle.   REVIEW OF SYSTEMS: Mackenzie [name] is doing well. She is having some mild shortness of breath with activity.  She says she thinks it is because she is fatigued and deconditioned due to her weight gain.  She denies cough, chest pain, fevers.  She continues to have some mild intermittent headaches related to stress.  She denies any vision changes, focal weakness, speech changes, or any other concerns.  We did stop the Aloxi and they are slightly better.  Otherwise, a detailed ROS was conducted and is non contributory today.    PAST MEDICAL HISTORY: Past Medical History:  Diagnosis Date  . Anemia   . Breast cancer ([place]) 01/14/2017   Left breast  . Diabetes mellitus    GDM    PAST SURGICAL HISTORY: Past Surgical History:  Procedure Laterality Date  . CESAREAN SECTION     x2  .  PORTACATH PLACEMENT Right 01/28/2017   Procedure: INSERTION PORT-A-CATH WITH ULTRASOUND;  Surgeon: Erroll Luna, MD;  Location: [place];  Service: General;  Laterality: Right;  . TUBAL LIGATION      FAMILY HISTORY Family History  Problem Relation  Age of Onset  . Breast cancer Paternal Grandmother 45       d.60s from breast cancer. Did not have treatment.  . Other Mother        W.25 from complications of surgery to remove brain tumor  The patient's father still alive at age 73. The patient's mother died with a brain tumor which the patient says was "benign". She was 56. The patient has one brother, no sisters. The only breast cancer in the family is a paternal grandmother who died from breast cancer at an unknown age. There is no history of ovarian or prostate cancer in the family.  GYNECOLOGIC HISTORY:  No LMP recorded. Menarche age 9 and first live birth age 59 she is [place] P4. The person is still having regular periods as of August 2018  SOCIAL HISTORY: (As of August 2018) Mackenzie [name] works at [employer] on the fifth floor, [place]. Her husband, [name], is a Building control surveyor. Daughter and married and lives in [place] and works as a Education administrator. Daughter, [name] lives in [place] and works in the police department. Daughters, Tibes and [place] are 47 and 6, living at home. The patient has no grandchildren. She attends a Tour manager   ADVANCED DIRECTIVES: Not in place   HEALTH MAINTENANCE: Social History   Tobacco Use  . Smoking status: Never Smoker  . Smokeless tobacco: Never Used  Substance Use Topics  . Alcohol use: Yes    Comment: social  . Drug use: No     Colonoscopy: Never  PAP: December 2017  Bone density: Never   No Known Allergies  Current Outpatient Medications  Medication Sig Dispense Refill  . B Complex Vitamins (VITAMIN B COMPLEX PO) Take 1 capsule by mouth daily.    . benzonatate (TESSALON) 100 MG capsule Take 1 capsule by mouth every 8 (eight) hours as needed.  0  . cetirizine (ZYRTEC) 10 MG tablet Take 10 mg by mouth daily.      . Cholecalciferol (VITAMIN D PO) Take daily by mouth.    . CRANBERRY PO Take 1 tablet by mouth 2 (two) times daily.    . fluconazole (DIFLUCAN) 150 MG tablet Take  1 tablet (150 mg total) by mouth daily. May repeat x 1 after 7 days if needed 2 tablet 0  . loratadine (CLARITIN) 10 MG tablet Take 1 tablet (10 mg total) by mouth daily. 90 tablet 2  . LORazepam (ATIVAN) 0.5 MG tablet Take 1 tablet (0.5 mg total) by mouth every 8 (eight) hours as needed for anxiety. 30 tablet 0  . montelukast (SINGULAIR) 10 MG tablet Take 10 mg by mouth at bedtime. prn    . Multiple Vitamin (MULTIVITAMIN) tablet Take 1 tablet by mouth daily.    Marland Kitchen nystatin (MYCOSTATIN) 100000 UNIT/ML suspension Take 5 mLs (500,000 Units total) by mouth 4 (four) times daily. 240 mL 0  . Omega-3 Fatty Acids (FISH OIL) 1000 MG CAPS Take 1,000 mg by mouth 2 (two) times daily.     Marland Kitchen PREVIDENT 5000 BOOSTER PLUS 1.1 % PSTE BRUSH ONCE A DAY IN PLACE OF REGULAR TOOTHPASTE  2  . prochlorperazine (COMPAZINE) 10 MG tablet Take by mouth.  1  . prochlorperazine (COMPAZINE) 10 MG tablet TAKE 1  TABLET (10 MG TOTAL) BY MOUTH EVERY 6 (SIX) HOURS AS NEEDED (NAUSEA OR VOMITING). 30 tablet 1  . terbinafine (LAMISIL) 250 MG tablet Take 1 tablet (250 mg total) daily by mouth. 90 tablet 0   No current facility-administered medications for this visit.    Facility-Administered Medications Ordered in Other Visits  Medication Dose Route Frequency Provider Last Rate Last Dose  . 0.9 %  sodium chloride infusion   Intravenous Once Magrinat, [name], MD      . CARBOplatin (PARAPLATIN) 300 mg in sodium chloride 0.9 % 100 mL chemo infusion  300 mg Intravenous Once Magrinat, [name], MD      . dexamethasone (DECADRON) injection 10 mg  10 mg Intravenous Once Magrinat, [name], MD      . gemcitabine (GEMZAR) 1,976 mg in sodium chloride 0.9 % 100 mL chemo infusion  800 mg/m2 (Treatment Plan Recorded) Intravenous Once Magrinat, [name], MD      . heparin lock flush 100 unit/mL  500 Units Intracatheter Once PRN Magrinat, [name], MD      . heparin lock flush 100 unit/mL  500 Units Intracatheter Once PRN Magrinat, [name], MD       . prochlorperazine (COMPAZINE) injection 10 mg  10 mg Intravenous Once Gardenia Phlegm, NP      . sodium chloride flush (NS) 0.9 % injection 10 mL  10 mL Intravenous PRN Magrinat, [name], MD   10 mL at 03/10/17 1239  . sodium chloride flush (NS) 0.9 % injection 10 mL  10 mL Intracatheter PRN Magrinat, [name], MD      . sodium chloride flush (NS) 0.9 % injection 10 mL  10 mL Intracatheter PRN Magrinat, [name], MD        OBJECTIVE:   Vitals:   06/19/17 1309  BP: (!) 150/100  Pulse: (!) 111  Resp: (!) 24  Temp: 98.3 F (36.8 C)  SpO2: 99%     Body mass index is 48.34 kg/m.   Wt Readings from Last 3 Encounters:  06/19/17 299 lb 8 oz (135.9 kg)  06/05/17 298 lb 6.4 oz (135.4 kg)  05/29/17 298 lb 6.4 oz (135.4 kg)  ECOG FS:1  GENERAL: Patient is a well appearing female in no acute distress HEENT:  Sclerae anicteric.  Oropharynx clear and moist. No ulcerations or evidence of oropharyngeal candidiasis. Neck is supple.  NODES:  No cervical, supraclavicular, or axillary lymphadenopathy palpated.  BREAST EXAM:  Deferred. LUNGS:  Clear to auscultation bilaterally.  No wheezes or rhonchi. HEART:  Regular rate and rhythm. No murmur appreciated. ABDOMEN:  Soft, nontender.  Positive, normoactive bowel sounds. No organomegaly palpated. MSK:  No focal spinal tenderness to palpation. Full range of motion bilaterally in the upper extremities. EXTREMITIES:  No peripheral edema.   SKIN:  Clear with no obvious rashes or skin changes. No nail dyscrasia. NEURO:  Nonfocal. Well oriented.  Appropriate affect.    LAB RESULTS:    Appointment on 06/19/2017  Component Date Value Ref Range Status  . WBC 06/19/2017 4.5  3.9 - 10.3 K/uL Final  . RBC 06/19/2017 3.77  3.70 - 5.45 MIL/uL Final  . Hemoglobin 06/19/2017 10.8* 11.6 - 15.9 g/dL Final  . HCT 06/19/2017 32.7* 34.8 - 46.6 % Final  . MCV 06/19/2017 86.7  79.5 - 101.0 fL Final  . MCH 06/19/2017 28.7  25.1 - 34.0 pg Final  . MCHC  06/19/2017 33.1  31.5 - 36.0 g/dL Final  . RDW 06/19/2017  17.2* 11.2 - 16.1 % Final  . Platelets 06/19/2017 305  145 - 400 K/uL Final  . Neutrophils Relative % 06/19/2017 61  % Final  . Neutro Abs 06/19/2017 2.8  1.5 - 6.5 K/uL Final  . Lymphocytes Relative 06/19/2017 20  % Final  . Lymphs Abs 06/19/2017 0.9  0.9 - 3.3 K/uL Final  . Monocytes Relative 06/19/2017 17  % Final  . Monocytes Absolute 06/19/2017 0.8  0.1 - 0.9 K/uL Final  . Eosinophils Relative 06/19/2017 1  % Final  . Eosinophils Absolute 06/19/2017 0.0  0.0 - 0.5 K/uL Final  . Basophils Relative 06/19/2017 1  % Final  . Basophils Absolute 06/19/2017 0.0  0.0 - 0.1 K/uL Final   Performed at [facility], [place] [address] [place], [place] [address]  . Sodium 06/19/2017 137  136 - 145 mmol/L Final  . Potassium 06/19/2017 3.8  3.3 - 4.7 mmol/L Final  . Chloride 06/19/2017 105  98 - 109 mmol/L Final  . CO2 06/19/2017 23  22 - 29 mmol/L Final  . Glucose, Bld 06/19/2017 163* 70 - 140 mg/dL Final  . BUN 06/19/2017 13  7 - 26 mg/dL Final  . Creatinine, Ser 06/19/2017 0.87  0.60 - 1.10 mg/dL Final  . Calcium 06/19/2017 9.2  8.4 - 10.4 mg/dL Final  . Total Protein 06/19/2017 7.1  6.4 - 8.3 g/dL Final  . Albumin 06/19/2017 3.9  3.5 - 5.0 g/dL Final  . AST 06/19/2017 21  5 - 34 U/L Final  . ALT 06/19/2017 29  0 - 55 U/L Final  . Alkaline Phosphatase 06/19/2017 108  40 - 150 U/L Final  . Total Bilirubin 06/19/2017 0.3  0.2 - 1.2 mg/dL Final  . GFR calc non Af Amer 06/19/2017 >60  >60 mL/min Final  . GFR calc Af Amer 06/19/2017 >60  >60 mL/min Final   Comment: (NOTE) The eGFR has been calculated using the CKD EPI equation. This calculation has not been validated in all clinical situations. eGFR's persistently <60 mL/min signify possible Chronic Kidney Disease.   Georgiann Hahn gap 06/19/2017 9  3 - 11 Final   Performed at [facility], [place] [address] [place], [place] [address]        STUDIES: No results found.   ELIGIBLE FOR AVAILABLE RESEARCH PROTOCOL: no  ASSESSMENT: 47 y.o. [place] woman status post left breast upper inner quadrant biopsy 01/14/2017 for a clinical T3 N2, stage IIA invasive ductal carcinoma, grade 1 or 2, estrogen and progesterone receptor positive, HER-2 nonamplified, with an MIB-1 of 20%.  (1) staging studies: Brain MRI, bone scan, and CT scan of the chest 02/05/2017 showed no brain lesions, no lung or liver lesions, a 4.9 cm mass in the left breast with left axillary and subpectoral adenopathy, and nonspecific bone scan tracer at L2, left scapula, and anterior ribs, with lumbar spine MRI suggested for further evaluation.  (2) neoadjuvant chemotherapy consisting of cyclophosphamide and doxorubicin in dose dense fashion 4 starting 02/10/2017, completed 03/24/2017, to be followed by weekly carboplatin and gemcitabine given days 1 and 8 of each 21-day cycle starting 04/14/2017  (3) definitive surgery to follow, most likely left modified radical mastectomy  (4) postmastectomy radiation is indicated  (5) adjuvant anti-estrogens to follow  (6) genetics testing 03/24/2017 through the Common Hereditary Cancer Panel offered by [facility] found no deleterious mutations in APC, ATM, AXIN2, BARD1, BMPR1A, BRCA1, BRCA2, BRIP1, CDH1, CDKN2A (p14ARF), CDKN2A (p16INK4a), CHEK2, CTNNA1, DICER1, EPCAM (Deletion/duplication testing  only), GREM1 (promoter region deletion/duplication testing only), KIT, MEN1, MLH1, MSH2, MSH3, MSH6, MUTYH, NBN, NF1, NHTL1, PALB2, PDGFRA, PMS2, POLD1, POLE, PTEN, RAD50, RAD51C, RAD51D, SDHB, SDHC, SDHD, SMAD4, SMARCA4. STK11, TP53, TSC1, TSC2, and VHL.  The following genes were evaluated for sequence changes only: SDHA and HOXB13 c.251G>A variant only.    PLAN:  Mackenzie [name] is doing well.  She will proceed with Gemcitabine/Carbo today.  I got a chest xray for her shortness of breath.  It shows low lung volumes.  After review, she takes  a lot of naps and and question it being related to deconditioning.  She will return in 1 week for day 8 of her Gemcitabine/Carbo and on 1/23 for MRI, 1/24 for f/u with Dr. Jana Hakim, and 1/25 for f/u with Dr. Marlou Starks.     She knows to call between now and her next appointment for any questions or concerns.    A total of (30) minutes of face-to-face time was spent with this patient with greater than 50% of that time in counseling and care-coordination.  [name], NP  06/19/17 1:15 PM Medical Oncology and Hematology [facility] [address] [place], [place] [address] Tel. [phone]    Fax. [phone]

## 2017-06-19 NOTE — Progress Notes (Signed)
[place] CSW Progress Note  CSW met w patient, reviewed concerns which include financial issues related to cancer diagnosis and treatment.  Assisted patient with connecting to resources related to human resources, referred to community agencies to assist w financial help, linked w food distribution program at [facility] to address food insecurity.  Provided bag of food from [employer].  Patient will review various options for financial assistance from foundations and nonprofits, will meet w CSW on 9/51 to complete applicable applications for help w rent, utilities and similar.    Social work interventions  - resources and referral - supportive counseling  Plan:  Return in one week to complete applications.  Link w available food distribution programs.    Edwyna Shell, LCSW Clinical Social Worker Phone:  [phone]

## 2017-06-19 NOTE — Telephone Encounter (Signed)
Spoke to patient while she was in treatment area today about results of xray.  Findings show low lung volumes.  NP suggests to patient to work on being up and more active during the day.  Patient expressed understanding and did not present with questions or concerns at this time.  She knows to call center with any issues.

## 2017-06-19 NOTE — Patient Instructions (Signed)
[facility] Discharge Instructions for Patients Receiving Chemotherapy  Today you received the following chemotherapy agents :  Gemzar, carboplatin  To help prevent nausea and vomiting after your treatment, we encourage you to take your nausea medication as prescribed.   If you develop nausea and vomiting that is not controlled by your nausea medication, call the clinic.   BELOW ARE SYMPTOMS THAT SHOULD BE REPORTED IMMEDIATELY:  *FEVER GREATER THAN 100.5 F  *CHILLS WITH OR WITHOUT FEVER  NAUSEA AND VOMITING THAT IS NOT CONTROLLED WITH YOUR NAUSEA MEDICATION  *UNUSUAL SHORTNESS OF BREATH  *UNUSUAL BRUISING OR BLEEDING  TENDERNESS IN MOUTH AND THROAT WITH OR WITHOUT PRESENCE OF ULCERS  *URINARY PROBLEMS  *BOWEL PROBLEMS  UNUSUAL RASH Items with * indicate a potential emergency and should be followed up as soon as possible.  Feel free to call the clinic should you have any questions or concerns. The clinic phone number is [phone].  Please show the [place] at check-in to the Emergency Department and triage nurse.

## 2017-06-19 NOTE — Patient Instructions (Signed)
Implanted Port Home Guide An implanted port is a type of central line that is placed under the skin. Central lines are used to provide IV access when treatment or nutrition needs to be given through a person's veins. Implanted ports are used for long-term IV access. An implanted port may be placed because:  You need IV medicine that would be irritating to the small veins in your hands or arms.  You need long-term IV medicines, such as antibiotics.  You need IV nutrition for a long period.  You need frequent blood draws for lab tests.  You need dialysis.  Implanted ports are usually placed in the chest area, but they can also be placed in the upper arm, the abdomen, or the leg. An implanted port has two main parts:  Reservoir. The reservoir is round and will appear as a small, raised area under your skin. The reservoir is the part where a needle is inserted to give medicines or draw blood.  Catheter. The catheter is a thin, flexible tube that extends from the reservoir. The catheter is placed into a large vein. Medicine that is inserted into the reservoir goes into the catheter and then into the vein.  How will I care for my incision site? Do not get the incision site wet. Bathe or shower as directed by your health care provider. How is my port accessed? Special steps must be taken to access the port:  Before the port is accessed, a numbing cream can be placed on the skin. This helps numb the skin over the port site.  Your health care provider uses a sterile technique to access the port. ? Your health care provider must put on a mask and sterile gloves. ? The skin over your port is cleaned carefully with an antiseptic and allowed to dry. ? The port is gently pinched between sterile gloves, and a needle is inserted into the port.  Only "non-coring" port needles should be used to access the port. Once the port is accessed, a blood return should be checked. This helps ensure that the port  is in the vein and is not clogged.  If your port needs to remain accessed for a constant infusion, a clear (transparent) bandage will be placed over the needle site. The bandage and needle will need to be changed every week, or as directed by your health care provider.  Keep the bandage covering the needle clean and dry. Do not get it wet. Follow your health care provider's instructions on how to take a shower or bath while the port is accessed.  If your port does not need to stay accessed, no bandage is needed over the port.  What is flushing? Flushing helps keep the port from getting clogged. Follow your health care provider's instructions on how and when to flush the port. Ports are usually flushed with saline solution or a medicine called heparin. The need for flushing will depend on how the port is used.  If the port is used for intermittent medicines or blood draws, the port will need to be flushed: ? After medicines have been given. ? After blood has been drawn. ? As part of routine maintenance.  If a constant infusion is running, the port may not need to be flushed.  How long will my port stay implanted? The port can stay in for as long as your health care provider thinks it is needed. When it is time for the port to come out, surgery will be   done to remove it. The procedure is similar to the one performed when the port was put in. When should I seek immediate medical care? When you have an implanted port, you should seek immediate medical care if:  You notice a bad smell coming from the incision site.  You have swelling, redness, or drainage at the incision site.  You have more swelling or pain at the port site or the surrounding area.  You have a fever that is not controlled with medicine.  This information is not intended to replace advice given to you by your health care provider. Make sure you discuss any questions you have with your health care provider. Document  Released: 05/26/2005 Document Revised: 11/01/2015 Document Reviewed: 01/31/2013 Elsevier Interactive Patient Education  2017 [employer]  

## 2017-06-19 NOTE — Telephone Encounter (Signed)
-----   Message from Gardenia Phlegm, NP sent at 06/19/2017  3:17 PM EST -----  Her xray is normal, but she has low lung volumes.  She needs to work on being up and a little bit more active during the day.   ----- Message ----- From: Interface, Rad Results In Sent: 06/19/2017   3:13 PM To: Gardenia Phlegm, NP

## 2017-06-22 ENCOUNTER — Telehealth: Payer: Self-pay | Admitting: Internal Medicine

## 2017-06-22 NOTE — Telephone Encounter (Signed)
She was sent here by the cancer center and they said she could get a box of food from [name].  I told her she is not a patient of ours but she insisted she was supposed to come here  (?)  Can you call her on Tuesday to discuss?  Her phone number [phone].

## 2017-06-25 ENCOUNTER — Telehealth: Payer: Self-pay | Admitting: *Deleted

## 2017-06-25 NOTE — Telephone Encounter (Signed)
This RN returned VM to pt per her call stating " I am scheduled for chemo tomorrow but not scheduled to be seen by the MD or Mendel Ryder "  " I need to schedule the appointment with one of them "  Obtained identified VM - message left informing pt MD appointment not required for chemo treatment unless she has concerns or symptoms. Stated next scheduled MD appointment on 07/02/2017 post scan for review and follow up.  This RN's name given for return call if needed.

## 2017-06-26 ENCOUNTER — Inpatient Hospital Stay: Payer: 59

## 2017-06-26 ENCOUNTER — Other Ambulatory Visit: Payer: Self-pay | Admitting: *Deleted

## 2017-06-26 ENCOUNTER — Encounter: Payer: Self-pay | Admitting: *Deleted

## 2017-06-26 VITALS — BP 155/88 | HR 103 | Temp 98.4°F | Resp 18 | Wt 297.8 lb

## 2017-06-26 DIAGNOSIS — C50812 Malignant neoplasm of overlapping sites of left female breast: Secondary | ICD-10-CM

## 2017-06-26 DIAGNOSIS — R5383 Other fatigue: Secondary | ICD-10-CM | POA: Diagnosis not present

## 2017-06-26 DIAGNOSIS — R635 Abnormal weight gain: Secondary | ICD-10-CM | POA: Diagnosis not present

## 2017-06-26 DIAGNOSIS — Z170 Estrogen receptor positive status [ER+]: Secondary | ICD-10-CM

## 2017-06-26 DIAGNOSIS — C773 Secondary and unspecified malignant neoplasm of axilla and upper limb lymph nodes: Secondary | ICD-10-CM | POA: Diagnosis not present

## 2017-06-26 DIAGNOSIS — Z95828 Presence of other vascular implants and grafts: Secondary | ICD-10-CM

## 2017-06-26 DIAGNOSIS — Z5111 Encounter for antineoplastic chemotherapy: Secondary | ICD-10-CM | POA: Diagnosis not present

## 2017-06-26 DIAGNOSIS — R0602 Shortness of breath: Secondary | ICD-10-CM | POA: Diagnosis not present

## 2017-06-26 DIAGNOSIS — C50912 Malignant neoplasm of unspecified site of left female breast: Secondary | ICD-10-CM

## 2017-06-26 LAB — CBC WITH DIFFERENTIAL/PLATELET
BASOS ABS: 0 10*3/uL (ref 0.0–0.1)
Basophils Relative: 1 %
EOS ABS: 0 10*3/uL (ref 0.0–0.5)
Eosinophils Relative: 1 %
HEMATOCRIT: 33.3 % — AB (ref 34.8–46.6)
HEMOGLOBIN: 10.8 g/dL — AB (ref 11.6–15.9)
Lymphocytes Relative: 25 %
Lymphs Abs: 0.8 10*3/uL — ABNORMAL LOW (ref 0.9–3.3)
MCH: 28.4 pg (ref 25.1–34.0)
MCHC: 32.6 g/dL (ref 31.5–36.0)
MCV: 87.2 fL (ref 79.5–101.0)
MONOS PCT: 7 %
Monocytes Absolute: 0.2 10*3/uL (ref 0.1–0.9)
NEUTROS ABS: 2.2 10*3/uL (ref 1.5–6.5)
Neutrophils Relative %: 66 %
Platelets: 273 10*3/uL (ref 145–400)
RBC: 3.82 MIL/uL (ref 3.70–5.45)
RDW: 17.5 % — ABNORMAL HIGH (ref 11.2–16.1)
WBC: 3.4 10*3/uL — AB (ref 3.9–10.3)

## 2017-06-26 LAB — COMPREHENSIVE METABOLIC PANEL
ALK PHOS: 115 U/L (ref 40–150)
ALT: 29 U/L (ref 0–55)
ANION GAP: 8 (ref 3–11)
AST: 22 U/L (ref 5–34)
Albumin: 3.8 g/dL (ref 3.5–5.0)
BILIRUBIN TOTAL: 0.2 mg/dL (ref 0.2–1.2)
BUN: 12 mg/dL (ref 7–26)
CALCIUM: 9.4 mg/dL (ref 8.4–10.4)
CO2: 25 mmol/L (ref 22–29)
Chloride: 101 mmol/L (ref 98–109)
Creatinine, Ser: 0.92 mg/dL (ref 0.60–1.10)
GFR calc Af Amer: >60 mL/min (ref >60)
GFR calc non Af Amer: >60 mL/min (ref >60)
GLUCOSE: 221 mg/dL — AB (ref 70–140)
Potassium: 3.6 mmol/L (ref 3.3–4.7)
SODIUM: 134 mmol/L — AB (ref 136–145)
TOTAL PROTEIN: 7.4 g/dL (ref 6.4–8.3)

## 2017-06-26 MED ORDER — PROCHLORPERAZINE EDISYLATE 5 MG/ML IJ SOLN
INTRAMUSCULAR | Status: AC
  Filled 2017-06-26: qty 2

## 2017-06-26 MED ORDER — HEPARIN SOD (PORK) LOCK FLUSH 100 UNIT/ML IV SOLN
500.0000 [IU] | Freq: Once | INTRAVENOUS | Status: AC | PRN
  Administered 2017-06-26: 500 [IU]
  Filled 2017-06-26: qty 5

## 2017-06-26 MED ORDER — PROCHLORPERAZINE EDISYLATE 5 MG/ML IJ SOLN
10.0000 mg | Freq: Once | INTRAMUSCULAR | Status: AC
  Administered 2017-06-26: 10 mg via INTRAVENOUS

## 2017-06-26 MED ORDER — DEXAMETHASONE SODIUM PHOSPHATE 10 MG/ML IJ SOLN
10.0000 mg | Freq: Once | INTRAMUSCULAR | Status: AC
  Administered 2017-06-26: 10 mg via INTRAVENOUS

## 2017-06-26 MED ORDER — SODIUM CHLORIDE 0.9 % IV SOLN
Freq: Once | INTRAVENOUS | Status: AC
  Administered 2017-06-26: 12:00:00 via INTRAVENOUS

## 2017-06-26 MED ORDER — SODIUM CHLORIDE 0.9% FLUSH
10.0000 mL | INTRAVENOUS | Status: DC | PRN
  Administered 2017-06-26: 10 mL via INTRAVENOUS
  Filled 2017-06-26: qty 10

## 2017-06-26 MED ORDER — CARBOPLATIN CHEMO INJECTION 450 MG/45ML
300.0000 mg | Freq: Once | INTRAVENOUS | Status: AC
  Administered 2017-06-26: 300 mg via INTRAVENOUS
  Filled 2017-06-26: qty 30

## 2017-06-26 MED ORDER — SODIUM CHLORIDE 0.9 % IV SOLN
2000.0000 mg | Freq: Once | INTRAVENOUS | Status: AC
  Administered 2017-06-26: 2000 mg via INTRAVENOUS
  Filled 2017-06-26: qty 52.6

## 2017-06-26 MED ORDER — DEXAMETHASONE SODIUM PHOSPHATE 10 MG/ML IJ SOLN
INTRAMUSCULAR | Status: AC
  Filled 2017-06-26: qty 1

## 2017-06-26 MED ORDER — SODIUM CHLORIDE 0.9% FLUSH
10.0000 mL | INTRAVENOUS | Status: DC | PRN
  Administered 2017-06-26: 10 mL
  Filled 2017-06-26: qty 10

## 2017-06-26 NOTE — Patient Instructions (Signed)
[facility] Discharge Instructions for Patients Receiving Chemotherapy  Today you received the following chemotherapy agents Gemzar and Carboplatin   To help prevent nausea and vomiting after your treatment, we encourage you to take your nausea medication as directed.    If you develop nausea and vomiting that is not controlled by your nausea medication, call the clinic.   BELOW ARE SYMPTOMS THAT SHOULD BE REPORTED IMMEDIATELY:  *FEVER GREATER THAN 100.5 F  *CHILLS WITH OR WITHOUT FEVER  NAUSEA AND VOMITING THAT IS NOT CONTROLLED WITH YOUR NAUSEA MEDICATION  *UNUSUAL SHORTNESS OF BREATH  *UNUSUAL BRUISING OR BLEEDING  TENDERNESS IN MOUTH AND THROAT WITH OR WITHOUT PRESENCE OF ULCERS  *URINARY PROBLEMS  *BOWEL PROBLEMS  UNUSUAL RASH Items with * indicate a potential emergency and should be followed up as soon as possible.  Feel free to call the clinic should you have any questions or concerns. The clinic phone number is [phone].  Please show the CHEMO ALERT CARD at check-in to the Emergency Department and triage nurse.   

## 2017-07-01 ENCOUNTER — Other Ambulatory Visit: Payer: Self-pay | Admitting: *Deleted

## 2017-07-01 ENCOUNTER — Ambulatory Visit (HOSPITAL_COMMUNITY)
Admission: RE | Admit: 2017-07-01 | Discharge: 2017-07-01 | Disposition: A | Discharge status: Home / Self Care | Payer: 59 | Source: Ambulatory Visit | Attending: Oncology | Admitting: Oncology

## 2017-07-01 ENCOUNTER — Other Ambulatory Visit: Payer: Self-pay | Admitting: Adult Health

## 2017-07-01 DIAGNOSIS — Z170 Estrogen receptor positive status [ER+]: Principal | ICD-10-CM

## 2017-07-01 DIAGNOSIS — C50812 Malignant neoplasm of overlapping sites of left female breast: Secondary | ICD-10-CM

## 2017-07-01 DIAGNOSIS — C773 Secondary and unspecified malignant neoplasm of axilla and upper limb lymph nodes: Secondary | ICD-10-CM

## 2017-07-01 DIAGNOSIS — C50912 Malignant neoplasm of unspecified site of left female breast: Secondary | ICD-10-CM

## 2017-07-01 MED ORDER — GADOBENATE DIMEGLUMINE 529 MG/ML IV SOLN: 20.0000 mL | Freq: Once | INTRAVENOUS | Status: DC | PRN

## 2017-07-01 NOTE — Progress Notes (Signed)
Pt is unable to fit into small bore scanner for her breast MRI. Pt has previously had Breast MRI at GI. Pt will need wide bore scanner.

## 2017-07-02 ENCOUNTER — Ambulatory Visit: Payer: 59 | Admitting: Oncology

## 2017-07-02 ENCOUNTER — Other Ambulatory Visit: Payer: 59

## 2017-07-06 ENCOUNTER — Telehealth: Payer: Self-pay | Admitting: Oncology

## 2017-07-06 ENCOUNTER — Inpatient Hospital Stay (HOSPITAL_BASED_OUTPATIENT_CLINIC_OR_DEPARTMENT_OTHER): Payer: 59 | Admitting: Oncology

## 2017-07-06 ENCOUNTER — Inpatient Hospital Stay: Payer: 59

## 2017-07-06 ENCOUNTER — Other Ambulatory Visit: Payer: 59

## 2017-07-06 ENCOUNTER — Ambulatory Visit
Admission: RE | Admit: 2017-07-06 | Discharge: 2017-07-06 | Disposition: A | Discharge status: Home / Self Care | Payer: 59 | Source: Ambulatory Visit | Attending: Oncology | Admitting: Oncology

## 2017-07-06 VITALS — BP 145/77 | HR 99 | Temp 97.7°F | Resp 18 | Ht 66.0 in | Wt 296.6 lb

## 2017-07-06 DIAGNOSIS — C50812 Malignant neoplasm of overlapping sites of left female breast: Secondary | ICD-10-CM

## 2017-07-06 DIAGNOSIS — C50912 Malignant neoplasm of unspecified site of left female breast: Secondary | ICD-10-CM

## 2017-07-06 DIAGNOSIS — R0602 Shortness of breath: Secondary | ICD-10-CM | POA: Diagnosis not present

## 2017-07-06 DIAGNOSIS — Z5111 Encounter for antineoplastic chemotherapy: Secondary | ICD-10-CM | POA: Diagnosis not present

## 2017-07-06 DIAGNOSIS — Z170 Estrogen receptor positive status [ER+]: Principal | ICD-10-CM

## 2017-07-06 DIAGNOSIS — R5383 Other fatigue: Secondary | ICD-10-CM | POA: Diagnosis not present

## 2017-07-06 DIAGNOSIS — N6489 Other specified disorders of breast: Secondary | ICD-10-CM | POA: Diagnosis not present

## 2017-07-06 DIAGNOSIS — C773 Secondary and unspecified malignant neoplasm of axilla and upper limb lymph nodes: Secondary | ICD-10-CM | POA: Diagnosis not present

## 2017-07-06 DIAGNOSIS — R635 Abnormal weight gain: Secondary | ICD-10-CM | POA: Diagnosis not present

## 2017-07-06 LAB — CBC WITH DIFFERENTIAL/PLATELET
BASOS ABS: 0 10*3/uL (ref 0.0–0.1)
Basophils Relative: 0 %
Eosinophils Absolute: 0 10*3/uL (ref 0.0–0.5)
Eosinophils Relative: 1 %
HEMATOCRIT: 32.7 % — AB (ref 34.8–46.6)
HEMOGLOBIN: 10.5 g/dL — AB (ref 11.6–15.9)
LYMPHS PCT: 27 %
Lymphs Abs: 1 10*3/uL (ref 0.9–3.3)
MCH: 28.3 pg (ref 25.1–34.0)
MCHC: 32.1 g/dL (ref 31.5–36.0)
MCV: 88.1 fL (ref 79.5–101.0)
MONOS PCT: 21 %
Monocytes Absolute: 0.7 10*3/uL (ref 0.1–0.9)
NEUTROS ABS: 1.8 10*3/uL (ref 1.5–6.5)
NEUTROS PCT: 51 %
Platelets: 141 10*3/uL — ABNORMAL LOW (ref 145–400)
RBC: 3.71 MIL/uL (ref 3.70–5.45)
RDW: 17 % — ABNORMAL HIGH (ref 11.2–16.1)
WBC: 3.6 10*3/uL — ABNORMAL LOW (ref 3.9–10.3)

## 2017-07-06 LAB — COMPREHENSIVE METABOLIC PANEL
ALBUMIN: 3.9 g/dL (ref 3.5–5.0)
ALT: 33 U/L (ref 0–55)
ANION GAP: 8 (ref 3–11)
AST: 21 U/L (ref 5–34)
Alkaline Phosphatase: 117 U/L (ref 40–150)
BUN: 12 mg/dL (ref 7–26)
CALCIUM: 9.3 mg/dL (ref 8.4–10.4)
CO2: 27 mmol/L (ref 22–29)
Chloride: 105 mmol/L (ref 98–109)
Creatinine, Ser: 0.95 mg/dL (ref 0.60–1.10)
GFR calc non Af Amer: >60 mL/min (ref >60)
Glucose, Bld: 165 mg/dL — ABNORMAL HIGH (ref 70–140)
POTASSIUM: 3.9 mmol/L (ref 3.3–4.7)
SODIUM: 140 mmol/L (ref 136–145)
Total Bilirubin: <0.2 mg/dL — ABNORMAL LOW (ref 0.2–1.2)
Total Protein: 7.3 g/dL (ref 6.4–8.3)

## 2017-07-06 MED ORDER — GADOBENATE DIMEGLUMINE 529 MG/ML IV SOLN
20.0000 mL | Freq: Once | INTRAVENOUS | Status: AC | PRN
  Administered 2017-07-06: 20 mL via INTRAVENOUS

## 2017-07-06 NOTE — Progress Notes (Signed)
[facility]  [phone] [phone]     ID: [name] DOB: Jul 11, 1970  MR#: [id number]  BMW#:[id number]  Patient Care Team: Willey Blade, MD as PCP - General (Internal Medicine) Aliyah Abeyta, [name], MD as Consulting Physician (Oncology) Erroll Luna, MD as Consulting Physician (General Surgery) Kyung Rudd, MD as Consulting Physician (Radiation Oncology) OTHER MD:  CHIEF COMPLAINT: Estrogen receptor positive breast cancer  CURRENT TREATMENT: Definitive surgery pending   HISTORY OF CURRENT ILLNESS: From the original intake note:  The patient herself noted some changes in her left breast late July 2018, she says, and eventually brought this to medical attention so that on 01/09/2017 she underwent bilateral diagnostic mammography with tomography and left breast ultrasonography at the breast Center. This found the breast density to be category C. In the upper inner quadrant of the left breast there was an area of asymmetry and there were malignant type calcifications involving all 4 quadrants. On exam there is firmness and palpable thickening in the anterior left breast with skin dimpling. Ultrasonography found at the 9:30 o'clock radiant 3 cm from the nipple a 2.7 cm mass and in the left axilla for abnormal lymph nodes largest of which measured 2.7 cm.  Biopsy of the left breast 9:30 o'clock mass 80 01/26/2017 showed ([id number]) invasive ductal carcinoma, with extracellular mucin, grade 1 or 2. In the lower outer left breast is separated biopsy the same day showed ductal carcinoma in situ. One of the 4 lymph nodes involved was positive for metastatic carcinoma. Prognostic panel on the invasive disease showed it to be estrogen receptor 100% positive, progesterone receptor 20% positive, both with strong staining intensity, with an MIB-1 of 20%, and no HER-2 amplification with a signals ratio 1.38 and the number per cell 2.90.  On 01/22/2017 the  patient underwent bilateral breast MRI. This showed no involvement of the right breast, but in the left breast there was a masslike and non-masslike enhancement involving all quadrants, with skin swelling but no abnormal enhancement of the skin or nipple areolar complex or pectoralis muscle. The mass could not be clearly measured but spanned approximately 12 cm. There was bulky left axillary lymphadenopathy, with the largest lymph node measuring up to 3.4 cm.  The patient's subsequent history is as detailed below.  INTERVAL HISTORY: Hansika returns today for evaluation and treatment of her estrogen receptor positive breast cancer accompanied by her 2 daughters. Since her last visit here she completed her final chemotherapy treatment, on 06/26/2017. She tolerated this well. She is now awaiting for her definitive surgery on 07/10/2017  She had an MRI of the breast this morning, showing an excellent response, with minimal residual enhancement.  It was improved metastatic left axillary and retropectoral adenopathy.    REVIEW OF SYSTEMS: Darlinda reports that she feels good and her energy is better. She notes that her breast surgery is on 07/10/2017. She notes that she tries to walk about a half a mile 3-4 times per week if its not too cold outside. She denies unusual headaches, visual changes, nausea, vomiting, or dizziness. There has been no unusual cough, phlegm production, or pleurisy. This been no change in bowel or bladder habits. She denies unexplained fatigue or unexplained weight loss, bleeding, rash, or fever. A detailed review of systems was otherwise stable.    PAST MEDICAL HISTORY: Past Medical History:  Diagnosis Date  . Anemia   . Breast cancer ([facility]) 01/14/2017   Left breast  . Diabetes mellitus  GDM    PAST SURGICAL HISTORY: Past Surgical History:  Procedure Laterality Date  . CESAREAN SECTION     x2  . PORTACATH PLACEMENT Right 01/28/2017   Procedure: INSERTION PORT-A-CATH  WITH ULTRASOUND;  Surgeon: Erroll Luna, MD;  Location: [facility];  Service: General;  Laterality: Right;  . TUBAL LIGATION      FAMILY HISTORY Family History  Problem Relation Age of Onset  . Breast cancer Paternal Grandmother 31       d.60s from breast cancer. Did not have treatment.  . Other Mother        R.48 from complications of surgery to remove brain tumor  The patient's father still alive at age 70. The patient's mother died with a brain tumor which the patient says was "benign". She was 56. The patient has one brother, no sisters. The only breast cancer in the family is a paternal grandmother who died from breast cancer at an unknown age. There is no history of ovarian or prostate cancer in the family.  GYNECOLOGIC HISTORY:  No LMP recorded. Menarche age 85 and first live birth age 78 she is [place] P4. The person is still having regular periods as of August 2018  SOCIAL HISTORY: (As of August 2018) Gwenetta works at [employer] on the fifth floor, [place]. Her husband, [name], is a Building control surveyor. Daughter and married and lives in [place] and works as a Education administrator. Daughter, [name] lives in [place] and works in the police department. Daughters, Clayton and Philpot are 65 and 6, living at home. The patient has no grandchildren. She attends a Tour manager   ADVANCED DIRECTIVES: Not in place   HEALTH MAINTENANCE: Social History   Tobacco Use  . Smoking status: Never Smoker  . Smokeless tobacco: Never Used  Substance Use Topics  . Alcohol use: Yes    Comment: social  . Drug use: No     Colonoscopy: Never  PAP: December 2017  Bone density: Never   No Known Allergies  Current Outpatient Medications  Medication Sig Dispense Refill  . B Complex Vitamins (VITAMIN B COMPLEX PO) Take 1 capsule by mouth daily.    . benzonatate (TESSALON) 100 MG capsule Take 1 capsule by mouth every 8 (eight) hours as needed.  0  . cetirizine (ZYRTEC) 10 MG  tablet Take 10 mg by mouth daily.      . Cholecalciferol (VITAMIN D PO) Take daily by mouth.    . CRANBERRY PO Take 1 tablet by mouth 2 (two) times daily.    . fluconazole (DIFLUCAN) 150 MG tablet Take 1 tablet (150 mg total) by mouth daily. May repeat x 1 after 7 days if needed 2 tablet 0  . loratadine (CLARITIN) 10 MG tablet Take 1 tablet (10 mg total) by mouth daily. 90 tablet 2  . LORazepam (ATIVAN) 0.5 MG tablet Take 1 tablet (0.5 mg total) by mouth every 8 (eight) hours as needed for anxiety. 30 tablet 0  . montelukast (SINGULAIR) 10 MG tablet Take 10 mg by mouth at bedtime. prn    . Multiple Vitamin (MULTIVITAMIN) tablet Take 1 tablet by mouth daily.    Marland Kitchen nystatin (MYCOSTATIN) 100000 UNIT/ML suspension Take 5 mLs (500,000 Units total) by mouth 4 (four) times daily. 240 mL 0  . Omega-3 Fatty Acids (FISH OIL) 1000 MG CAPS Take 1,000 mg by mouth 2 (two) times daily.     Marland Kitchen PREVIDENT 5000 BOOSTER PLUS 1.1 % PSTE BRUSH ONCE A DAY IN PLACE OF  REGULAR TOOTHPASTE  2  . prochlorperazine (COMPAZINE) 10 MG tablet Take by mouth.  1  . prochlorperazine (COMPAZINE) 10 MG tablet TAKE 1 TABLET (10 MG TOTAL) BY MOUTH EVERY 6 (SIX) HOURS AS NEEDED (NAUSEA OR VOMITING). 30 tablet 1  . terbinafine (LAMISIL) 250 MG tablet Take 1 tablet (250 mg total) daily by mouth. 90 tablet 0   No current facility-administered medications for this visit.    Facility-Administered Medications Ordered in Other Visits  Medication Dose Route Frequency Provider Last Rate Last Dose  . heparin lock flush 100 unit/mL  500 Units Intracatheter Once PRN Magrinat, [name], MD      . sodium chloride flush (NS) 0.9 % injection 10 mL  10 mL Intravenous PRN Magrinat, [name], MD   10 mL at 03/10/17 1239  . sodium chloride flush (NS) 0.9 % injection 10 mL  10 mL Intracatheter PRN Magrinat, [name], MD        OBJECTIVE: Middle-aged African-American woman in no acute distress  Vitals:   07/06/17 1459  BP: (!) 145/77  Pulse: 99    Resp: 18  Temp: 97.7 F (36.5 C)  SpO2: 100%     Body mass index is 47.87 kg/m.   Wt Readings from Last 3 Encounters:  07/06/17 296 lb 9.6 oz (134.5 kg)  06/26/17 297 lb 12 oz (135.1 kg)  06/19/17 299 lb 8 oz (135.9 kg)  ECOG FS:1   Sclerae unicteric, EOMs intact Oropharynx clear and moist No cervical or supraclavicular adenopathy Lungs no rales or rhonchi Heart regular rate and rhythm Abd soft, nontender, positive bowel sounds MSK no focal spinal tenderness, no upper extremity lymphedema Neuro: nonfocal, well oriented, appropriate affect Breasts: The right breast is unremarkable.  I do not palpate a well-defined mass in the left breast.  Both axillae are benign.    LAB RESULTS:    Appointment on 07/06/2017  Component Date Value Ref Range Status  . Sodium 07/06/2017 140  136 - 145 mmol/L Final  . Potassium 07/06/2017 3.9  3.3 - 4.7 mmol/L Final  . Chloride 07/06/2017 105  98 - 109 mmol/L Final  . CO2 07/06/2017 27  22 - 29 mmol/L Final  . Glucose, Bld 07/06/2017 165* 70 - 140 mg/dL Final  . BUN 07/06/2017 12  7 - 26 mg/dL Final  . Creatinine, Ser 07/06/2017 0.95  0.60 - 1.10 mg/dL Final  . Calcium 07/06/2017 9.3  8.4 - 10.4 mg/dL Final  . Total Protein 07/06/2017 7.3  6.4 - 8.3 g/dL Final  . Albumin 07/06/2017 3.9  3.5 - 5.0 g/dL Final  . AST 07/06/2017 21  5 - 34 U/L Final  . ALT 07/06/2017 33  0 - 55 U/L Final  . Alkaline Phosphatase 07/06/2017 117  40 - 150 U/L Final  . Total Bilirubin 07/06/2017 <0.2* 0.2 - 1.2 mg/dL Final  . GFR calc non Af Amer 07/06/2017 >60  >60 mL/min Final  . GFR calc Af Amer 07/06/2017 >60  >60 mL/min Final   Comment: (NOTE) The eGFR has been calculated using the CKD EPI equation. This calculation has not been validated in all clinical situations. eGFR's persistently <60 mL/min signify possible Chronic Kidney Disease.   Georgiann Hahn gap 07/06/2017 8  3 - 11 Final   Performed at [facility], [place] [address]  [place], [place] [address]  . WBC 07/06/2017 3.6* 3.9 - 10.3 K/uL Final  . RBC 07/06/2017 3.71  3.70 - 5.45 MIL/uL Final  . Hemoglobin  07/06/2017 10.5* 11.6 - 15.9 g/dL Final  . HCT 07/06/2017 32.7* 34.8 - 46.6 % Final  . MCV 07/06/2017 88.1  79.5 - 101.0 fL Final  . MCH 07/06/2017 28.3  25.1 - 34.0 pg Final  . MCHC 07/06/2017 32.1  31.5 - 36.0 g/dL Final  . RDW 07/06/2017 17.0* 11.2 - 16.1 % Final  . Platelets 07/06/2017 141* 145 - 400 K/uL Final  . Neutrophils Relative % 07/06/2017 51  % Final  . Neutro Abs 07/06/2017 1.8  1.5 - 6.5 K/uL Final  . Lymphocytes Relative 07/06/2017 27  % Final  . Lymphs Abs 07/06/2017 1.0  0.9 - 3.3 K/uL Final  . Monocytes Relative 07/06/2017 21  % Final  . Monocytes Absolute 07/06/2017 0.7  0.1 - 0.9 K/uL Final  . Eosinophils Relative 07/06/2017 1  % Final  . Eosinophils Absolute 07/06/2017 0.0  0.0 - 0.5 K/uL Final  . Basophils Relative 07/06/2017 0  % Final  . Basophils Absolute 07/06/2017 0.0  0.0 - 0.1 K/uL Final   Performed at [facility], [place] [name] [place], [place] [address]       STUDIES: Dg Chest 2 View  Result Date: 06/19/2017 CLINICAL DATA:  Breast cancer. EXAM: CHEST  2 VIEW COMPARISON:  CT 02/05/2017. FINDINGS: PowerPort catheter noted with tip over the cavoatrial junction. Heart size normal. Low lung volumes. No focal infiltrate. No pleural effusion or pneumothorax. No acute bony abnormality. IMPRESSION: 1.  PowerPort catheter noted with tip over the cavoatrial junction. 2.  Low lung volumes.  No acute pulmonary disease. Electronically Signed   By: Marcello Moores  Register   On: 06/19/2017 15:11   Mr Breast Bilateral W Wo Contrast Inc Cad  Result Date: 07/06/2017 CLINICAL DATA:  Status post neoadjuvant chemotherapy for invasive ductal carcinoma with extracellular mucin in the 9:30 o'clock position of the left breast, high-grade ductal carcinoma in situ with calcifications in the lower outer left breast, additional  malignant appearing calcifications in all 4 quadrants of the left breast and metastatic left axillary lymph nodes. LABS:  None obtained on site today. EXAM: BILATERAL BREAST MRI WITH AND WITHOUT CONTRAST TECHNIQUE: Multiplanar, multisequence MR images of both breasts were obtained prior to and following the intravenous administration of 20 ml of MultiHance. THREE-DIMENSIONAL MR IMAGE RENDERING ON INDEPENDENT WORKSTATION: Three-dimensional MR images were rendered by post-processing of the original MR data on an independent workstation. The three-dimensional MR images were interpreted, and findings are reported in the following complete MRI report for this study. Three dimensional images were evaluated at the independent DynaCad workstation COMPARISON:  Breast MRI dated 01/22/2017 and previous mammogram, ultrasound and biopsy examinations. Chest CT dated 02/05/2017 and chest CTA dated 03/13/2007. FINDINGS: Breast composition: c. Heterogeneous fibroglandular tissue. Background parenchymal enhancement: Mild. Right breast: No mass or abnormal enhancement. Left breast: There is minimal residual non mass enhancement involving all 4 quadrants of the left breast. This is most pronounced in the upper inner quadrant with persistent enhancement kinetics. There is corresponding increased signal intensity in these regions of the breast on the inversion recovery images with a decrease in signal intensity and mild decrease in size. This previously spanned 12 cm in AP dimension and currently spans 10 cm in AP dimension. There is also less skin thickening on the left with no abnormal enhancement. Lymph nodes: The previously demonstrated enlarged, enhancing left axillary and retropectoral lymph nodes are significantly smaller. The largest left axillary node previously had a short axis diameter of 2.8 cm on  image number 29 of series 3 and currently has a short axis diameter of 1.9 cm on image number 23 of series 2. A previously  demonstrated enlarged retropectoral node had a short axis diameter of 1.4 cm on image number 3 of series 25 previously and currently has a short axis diameter of 0.6 cm on image number 12 of series 2. There is minimal peripheral enhancement in these nodes currently, previously diffusely enhancing. Ancillary findings: Previously demonstrated probable small cysts in the liver are not as easily visualized today, partly due to motion artifacts on today's examination. On the CT dated 02/05/2017 and CTA dated 03/13/2007, these had features compatible with small cysts without significant change over a 10 year period of time. IMPRESSION: 1. Positive imaging response to neoadjuvant chemotherapy with a significant decrease in non mass enhancement in all 4 quadrants of the left breast with minimal residual enhancement. There has also been a mild decrease in size of the area of enhancement and inversion recovery high signal intensity in all 4 quadrants with less skin thickening. 2. Improved metastatic left axillary and retropectoral adenopathy with an interval decrease in size of the metastatic nodes and significant decrease in enhancement. 3. No evidence of malignancy on the right. RECOMMENDATION: Treatment plan. BI-RADS CATEGORY  6: Known biopsy-proven malignancy. Electronically Signed   By: [name] M.D.   On: 07/06/2017 10:46  Dg Chest 2 View  Result Date: 06/19/2017 CLINICAL DATA:  Breast cancer. EXAM: CHEST  2 VIEW COMPARISON:  CT 02/05/2017. FINDINGS: PowerPort catheter noted with tip over the cavoatrial junction. Heart size normal. Low lung volumes. No focal infiltrate. No pleural effusion or pneumothorax. No acute bony abnormality. IMPRESSION: 1.  PowerPort catheter noted with tip over the cavoatrial junction. 2.  Low lung volumes.  No acute pulmonary disease. Electronically Signed   By: Marcello Moores  Register   On: 06/19/2017 15:11   Mr Breast Bilateral W Wo Contrast Inc Cad  Result Date: 07/06/2017 CLINICAL  DATA:  Status post neoadjuvant chemotherapy for invasive ductal carcinoma with extracellular mucin in the 9:30 o'clock position of the left breast, high-grade ductal carcinoma in situ with calcifications in the lower outer left breast, additional malignant appearing calcifications in all 4 quadrants of the left breast and metastatic left axillary lymph nodes. LABS:  None obtained on site today. EXAM: BILATERAL BREAST MRI WITH AND WITHOUT CONTRAST TECHNIQUE: Multiplanar, multisequence MR images of both breasts were obtained prior to and following the intravenous administration of 20 ml of MultiHance. THREE-DIMENSIONAL MR IMAGE RENDERING ON INDEPENDENT WORKSTATION: Three-dimensional MR images were rendered by post-processing of the original MR data on an independent workstation. The three-dimensional MR images were interpreted, and findings are reported in the following complete MRI report for this study. Three dimensional images were evaluated at the independent DynaCad workstation COMPARISON:  Breast MRI dated 01/22/2017 and previous mammogram, ultrasound and biopsy examinations. Chest CT dated 02/05/2017 and chest CTA dated 03/13/2007. FINDINGS: Breast composition: c. Heterogeneous fibroglandular tissue. Background parenchymal enhancement: Mild. Right breast: No mass or abnormal enhancement. Left breast: There is minimal residual non mass enhancement involving all 4 quadrants of the left breast. This is most pronounced in the upper inner quadrant with persistent enhancement kinetics. There is corresponding increased signal intensity in these regions of the breast on the inversion recovery images with a decrease in signal intensity and mild decrease in size. This previously spanned 12 cm in AP dimension and currently spans 10 cm in AP dimension. There is also less skin thickening  on the left with no abnormal enhancement. Lymph nodes: The previously demonstrated enlarged, enhancing left axillary and retropectoral  lymph nodes are significantly smaller. The largest left axillary node previously had a short axis diameter of 2.8 cm on image number 29 of series 3 and currently has a short axis diameter of 1.9 cm on image number 23 of series 2. A previously demonstrated enlarged retropectoral node had a short axis diameter of 1.4 cm on image number 3 of series 25 previously and currently has a short axis diameter of 0.6 cm on image number 12 of series 2. There is minimal peripheral enhancement in these nodes currently, previously diffusely enhancing. Ancillary findings: Previously demonstrated probable small cysts in the liver are not as easily visualized today, partly due to motion artifacts on today's examination. On the CT dated 02/05/2017 and CTA dated 03/13/2007, these had features compatible with small cysts without significant change over a 10 year period of time. IMPRESSION: 1. Positive imaging response to neoadjuvant chemotherapy with a significant decrease in non mass enhancement in all 4 quadrants of the left breast with minimal residual enhancement. There has also been a mild decrease in size of the area of enhancement and inversion recovery high signal intensity in all 4 quadrants with less skin thickening. 2. Improved metastatic left axillary and retropectoral adenopathy with an interval decrease in size of the metastatic nodes and significant decrease in enhancement. 3. No evidence of malignancy on the right. RECOMMENDATION: Treatment plan. BI-RADS CATEGORY  6: Known biopsy-proven malignancy. Electronically Signed   By: [name] M.D.   On: 07/06/2017 10:46      ELIGIBLE FOR AVAILABLE RESEARCH PROTOCOL: no  ASSESSMENT: 47 y.o. [place] woman status post left breast upper inner quadrant biopsy 01/14/2017 for a clinical T3 N2, stage IIA invasive ductal carcinoma, grade 1 or 2, estrogen and progesterone receptor positive, HER-2 nonamplified, with an MIB-1 of 20%.  (1) staging studies: Brain MRI, bone  scan, and CT scan of the chest 02/05/2017 showed no brain lesions, no lung or liver lesions, a 4.9 cm mass in the left breast with left axillary and subpectoral adenopathy, and nonspecific bone scan tracer at L2, left scapula, and anterior ribs, with lumbar spine MRI suggested for further evaluation.  (a) lumbar spine MRI 02/17/2017 showed no normal bone lesions.  There was mild lumbar spondylosis  (2) neoadjuvant chemotherapy consisting of cyclophosphamide and doxorubicin in dose dense fashion 4 started 02/10/2017, completed 03/24/2017, followed by weekly carboplatin and gemcitabine given days 1 and 8 of each 21-day cycle starting 04/14/2017, completing the planned 4 cycles 06/26/2017  (3) definitive surgery to follow, most likely left modified radical mastectomy  (4) postmastectomy radiation is indicated  (5) adjuvant anti-estrogens to follow  (6) genetics testing 03/24/2017 through the Common Hereditary Cancer Panel offered by [facility] found no deleterious mutations in APC, ATM, AXIN2, BARD1, BMPR1A, BRCA1, BRCA2, BRIP1, CDH1, CDKN2A (p14ARF), CDKN2A (p16INK4a), CHEK2, CTNNA1, DICER1, EPCAM (Deletion/duplication testing only), GREM1 (promoter region deletion/duplication testing only), KIT, MEN1, MLH1, MSH2, MSH3, MSH6, MUTYH, NBN, NF1, NHTL1, PALB2, PDGFRA, PMS2, POLD1, POLE, PTEN, RAD50, RAD51C, RAD51D, SDHB, SDHC, SDHD, SMAD4, SMARCA4. STK11, TP53, TSC1, TSC2, and VHL.  The following genes were evaluated for sequence changes only: SDHA and HOXB13 c.251G>A variant only.    PLAN:  Vaniah has completed her radiation treatments and the post treatment MRI shows a significant response.  She has recovered well from chemotherapy.  Her hair is already coming back.  She is approaching normal activity for her.  She is now ready for right mastectomy and sentinel lymph node sampling.  She is considering left mastectomy.  She understands that would not improve survival but it might take a wait off her  mind.  There may be surgical and reconstructive issues connected with that.  She will discuss it with Dr Brantley Stage at their upcoming visit.  She understands she will need postmastectomy radiation and I am making her an appointment with Dr. Lisbeth Renshaw in about 4 weeks.  I will see her about the same time to begin to discuss antiestrogens  She knows to call for any other issues that may develop before that visit.Jana Hakim, [name], MD  07/06/17 3:26 PM Medical Oncology and Hematology [facility] [address] [place], [place] [address] Tel. [phone]    Fax. [phone]  This document serves as a record of services personally performed by Lurline Del, MD. It was created on his behalf by Sheron Nightingale, a trained medical scribe. The creation of this record is based on the scribe's personal observations and the provider's statements to them.   I have reviewed the above documentation for accuracy and completeness, and I agree with the above.

## 2017-07-06 NOTE — Telephone Encounter (Signed)
Gave patient avs and calendar with appts per 1/28 los.  °

## 2017-07-10 ENCOUNTER — Ambulatory Visit: Payer: Self-pay | Admitting: Surgery

## 2017-07-10 DIAGNOSIS — C50912 Malignant neoplasm of unspecified site of left female breast: Secondary | ICD-10-CM | POA: Diagnosis not present

## 2017-07-10 NOTE — H&P (Signed)
Mackenzie [name] Documented: 07/10/2017 10:49 AM Location: [facility] Patient #: [id number] DOB: January 24, 1971 Married / Language: English / Race: Black or African American Female  History of Present Illness Mackenzie Moores A. Terre Zabriskie MD; 07/10/2017 11:36 AM) Patient words: Patient returns after neoadjuvant therapy for multifocal left breast cancer. She is significant for quadrant disease as well as significant left axillary lymph node involvement. She has completed chemotherapy and her most recent MRI shows a very good response. There is no disease in the right. She is done with chemotherapy. She is ready to proceed to her next step. She feels well overall and is regaining strength.             ADDITIONAL INFORMATION: 1. PROGNOSTIC INDICATORS Results: IMMUNOHISTOCHEMICAL AND MORPHOMETRIC ANALYSIS PERFORMED MANUALLY Estrogen Receptor: 100%, POSITIVE, STRONG STAINING INTENSITY Progesterone Receptor: 60%, POSITIVE, STRONG STAINING INTENSITY REFERENCE RANGE ESTROGEN RECEPTOR NEGATIVE 0% POSITIVE =>1% REFERENCE RANGE PROGESTERONE RECEPTOR NEGATIVE 0% POSITIVE =>1% All controls stained appropriately [name] MD Pathologist, Electronic Signature ( Signed 01/20/2017) 2. PROGNOSTIC INDICATORS Results: IMMUNOHISTOCHEMICAL AND MORPHOMETRIC ANALYSIS PERFORMED MANUALLY Estrogen Receptor: 100%, POSITIVE, STRONG STAINING INTENSITY Progesterone Receptor: 20%, POSITIVE, STRONG STAINING INTENSITY Proliferation Marker Ki67: 20% REFERENCE RANGE ESTROGEN RECEPTOR NEGATIVE 0%   ADDITIONAL INFORMATION: 1. PROGNOSTIC INDICATORS Results: IMMUNOHISTOCHEMICAL AND MORPHOMETRIC ANALYSIS PERFORMED MANUALLY Estrogen Receptor: 100%, POSITIVE, STRONG STAINING INTENSITY Progesterone Receptor: 60%, POSITIVE, STRONG STAINING INTENSITY REFERENCE RANGE ESTROGEN RECEPTOR NEGATIVE 0% POSITIVE =>1% REFERENCE RANGE PROGESTERONE RECEPTOR NEGATIVE 0% POSITIVE =>1% All controls stained  appropriately [name] MD Pathologist, Electronic Signature ( Signed 01/20/2017) 2. 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  LABS: None obtained on site today.  EXAM: BILATERAL BREAST MRI WITH AND WITHOUT CONTRAST  TECHNIQUE: Multiplanar, multisequence MR images of both breasts were obtained prior to and following the intravenous administration of 20 ml of MultiHance.  THREE-DIMENSIONAL MR IMAGE RENDERING ON INDEPENDENT WORKSTATION:  Three-dimensional MR images were rendered by post-processing of the original MR data on an independent workstation. The three-dimensional MR images were interpreted, and findings are reported in the following complete MRI report for this study. Three dimensional images were evaluated at the independent DynaCad workstation  COMPARISON: Breast MRI dated 01/22/2017 and previous mammogram, ultrasound and biopsy examinations. Chest CT dated 02/05/2017 and chest CTA dated 03/13/2007.  FINDINGS: Breast composition: c. Heterogeneous fibroglandular tissue.  Background parenchymal enhancement: Mild.  Right breast: No mass or abnormal enhancement.  Left breast: There is minimal residual non mass enhancement involving all 4 quadrants of the left breast. This is most pronounced in the upper  inner quadrant with persistent enhancement kinetics. There is corresponding increased signal intensity in these regions of the breast on the inversion recovery images with a decrease in signal intensity and mild decrease in size. This previously spanned 12 cm in AP dimension and currently spans 10 cm in AP dimension. There is also less skin thickening on the left with no abnormal enhancement.  Lymph  nodes: The previously demonstrated enlarged, enhancing left axillary and retropectoral lymph nodes are significantly smaller. The largest left axillary node previously had a short axis diameter of 2.8 cm on image number 29 of series 3 and currently has a short axis diameter of 1.9 cm on image number 23 of series 2.  A previously demonstrated enlarged retropectoral node had a short axis diameter of 1.4 cm on image number 3 of series 25 previously and currently has a short axis diameter of 0.6 cm on image number 12 of series 2. There is minimal peripheral enhancement in these nodes currently, previously diffusely enhancing.  Ancillary findings: Previously demonstrated probable small cysts in the liver are not as easily visualized today, partly due to motion artifacts on today's examination. On the CT dated 02/05/2017 and CTA dated 03/13/2007, these had features compatible with small cysts without significant change over a 10 year period of time.  IMPRESSION: 1. Positive imaging response to neoadjuvant chemotherapy with a significant decrease in non mass enhancement in all 4 quadrants of the left breast with minimal residual enhancement. There has also been a mild decrease in size of the area of enhancement and inversion recovery high signal intensity in all 4 quadrants with less skin thickening. 2. Improved metastatic left axillary and retropectoral adenopathy with an interval decrease in size of the metastatic nodes and significant decrease in enhancement. 3. No evidence of malignancy  on the right.  RECOMMENDATION: Treatment plan.  BI-RADS CATEGORY 6: Known biopsy-proven malignancy.   Electronically Signed By: [name] M.D. On: 07/06/2017 10:46.  The patient is a 47 year old female.   Allergies Alean Rinne, [place]; 07/10/2017 10:49 AM) No Known Drug Allergies [07/10/2017]: Allergies Reconciled  Medication History Alean Rinne, [place]; 07/10/2017 10:50 AM) Hydrocodone-Homatropine (5-1.'5MG'$ /5ML Syrup, Oral) Active. Benzonatate ('100MG'$  Capsule, Oral) Active. Singulair ('10MG'$  Tablet, Oral) Active. Vitamin D (Cholecalciferol) (Oral) Specific strength unknown - Active. Calcium (Oral) Specific strength unknown - Active. LORazepam (0.'5MG'$  Tablet, Oral) Active. Dexamethasone ('4MG'$  Tablet, Oral) Active. Fluconazole ('150MG'$  Tablet, Oral) Active. Ondansetron HCl ('8MG'$  Tablet, Oral) Active. PreviDent 5000 Booster Plus (1.1% Paste, Dental) Active. Prochlorperazine Maleate ('10MG'$  Tablet, Oral) Active. Terbinafine HCl ('250MG'$  Tablet, Oral) Active. Nystatin (100000 UNIT/ML Suspension, Mouth/Throat) Active. Diflucan ('150MG'$  Tablet, Oral) Active. Medications Reconciled    Vitals Alean Rinne RMA; 07/10/2017 10:49 AM) 07/10/2017 10:49 AM Weight: 296.2 lb Height: 66in Body Surface Area: 2.36 m Body Mass Index: 47.81 kg/m  Temp.: 99.39F  Pulse: 98 (Regular)  BP: 155/80 (Sitting, Left Arm, Standard)      Physical Exam (Jonathan Corpus A. Phillis Thackeray MD; 07/10/2017 11:36 AM)  General Mental Status-Alert. General Appearance-Consistent with stated age. Hydration-Well hydrated. Voice-Normal.  Head and Neck Head-normocephalic, atraumatic with no lesions or palpable masses. Trachea-midline. Thyroid Gland Characteristics - normal size and consistency.  Chest and Lung Exam Chest and lung exam reveals -quiet, even and easy respiratory effort with no use of accessory muscles and on auscultation, normal breath sounds, no adventitious sounds and  normal vocal resonance. Inspection Chest Wall - Normal. Back - normal.  Breast Breast - Left-Symmetric, Non Tender, No Biopsy scars, no Dimpling, No Inflammation, No Lumpectomy scars, No Mastectomy scars, No Peau d' Orange. Breast - Right-Symmetric, Non Tender, No Biopsy scars, no Dimpling, No Inflammation, No Lumpectomy scars, No Mastectomy scars, No Peau d' Orange. Breast Lump-No Palpable Breast Mass.  Cardiovascular Cardiovascular examination reveals -normal heart sounds, regular rate and rhythm with no murmurs and normal pedal pulses bilaterally.  Neurologic Neurologic evaluation reveals -alert and oriented x 3 with  no impairment of recent or remote memory. Mental Status-Normal.  Lymphatic Head & Neck  General Head & Neck Lymphatics: Bilateral - Description - Normal. Axillary  General Axillary Region: Bilateral - Description - Normal. Tenderness - Non Tender.    Assessment & Plan (Brighten Orndoff A. Birttany Dechellis MD; 07/10/2017 11:36 AM)  BREAST CANCER, LEFT (C50.912) Impression: pt needs left MRM and desires reconstruction Report of plastic surgery  Given her initial lymph node burden I do not recommend recommend targeted left axillary ultrasound dissection.  Discussed treatment options for breast cancer to include breast conservation vs mastectomy with reconstruction. Pt has decided on mastectomy. Risk include bleeding, infection, flap necrosis, pain, numbness, recurrence, hematoma, other surgery needs. Pt understands and agrees to proceed.  Current Plans You are being scheduled for surgery- Our schedulers will call you.  You should hear from our office's scheduling department within 5 working days about the location, date, and time of surgery. We try to make accommodations for patient's preferences in scheduling surgery, but sometimes the OR schedule or the surgeon's schedule prevents [name] from making those accommodations.  If you have not heard from our office  [phone]) in 5 working days, call the office and ask for your surgeon's nurse.  If you have other questions about your diagnosis, plan, or surgery, call the office and ask for your surgeon's nurse.  Pt Education - [facility] Breast Cancer Information Given - [employer] "Breast Journey" Package Pt Education - flb breast cancer surgery: discussed with patient and provided information. Pt Education - [facility] Mastectomy HCI Pt Education - [facility] (After Breast Cancer) Class Info: discussed with patient and provided information.

## 2017-07-27 DIAGNOSIS — Z170 Estrogen receptor positive status [ER+]: Secondary | ICD-10-CM

## 2017-07-27 DIAGNOSIS — C50212 Malignant neoplasm of upper-inner quadrant of left female breast: Secondary | ICD-10-CM | POA: Diagnosis not present

## 2017-07-30 ENCOUNTER — Telehealth: Payer: Self-pay | Admitting: Oncology

## 2017-07-30 NOTE — Telephone Encounter (Signed)
Called patient regarding 4/11

## 2017-08-04 ENCOUNTER — Encounter: Payer: Self-pay | Admitting: Radiation Oncology

## 2017-08-14 ENCOUNTER — Ambulatory Visit: Payer: 59 | Admitting: Oncology

## 2017-08-14 ENCOUNTER — Other Ambulatory Visit: Payer: 59

## 2017-08-17 ENCOUNTER — Other Ambulatory Visit: Payer: 59

## 2017-08-17 ENCOUNTER — Ambulatory Visit: Payer: 59 | Admitting: Oncology

## 2017-08-18 MED FILL — CEPHALEXIN 500 MG CAPSULE: 500 | 7 days supply | Qty: 28 | Fill #0

## 2017-08-19 NOTE — Pre-Procedure Instructions (Addendum)
Mackenzie [name]  08/19/2017      [place], [place] - [address] [facility]. [address] [place] [address] Phone: [phone] Fax: [phone]    Your procedure is scheduled on August 27, 2017.  Report to [facility] Admitting at 530 AM.  Call this number if you have problems the morning of surgery:  [phone]   Remember:  Do not eat food or drink liquids after midnight.  Take these medicines the morning of surgery with A SIP OF WATER tylenol-if needed, cetirizine (zyrtec), loratadine (claritin)-if needed, lorazepam (ativan)-if needed, ondansetron (zofran) or prochlorperazine (compazine)-if needed for nausea.  Please finish drinking your Ensure Pre-surgery drink before leaving your house the morning of surgery.  7 days prior to surgery STOP taking any Aspirin (unless otherwise instructed by your surgeon), Aleve, Naproxen, Ibuprofen, Motrin, Advil, Goody's, BC's, all herbal medications, fish oil, and all vitamins  Continue all other medications as instructed by your physician except follow the above medication instructions before surgery   Do not wear jewelry, make-up or nail polish.  Do not wear lotions, powders, or perfumes, or deodorant.  Do not shave 48 hours prior to surgery.    Do not bring valuables to the hospital.  [facility] is not responsible for any belongings or valuables.  Contacts, dentures or bridgework may not be worn into surgery.  Leave your suitcase in the car.  After surgery it may be brought to your room.  For patients admitted to the hospital, discharge time will be determined by your treatment team.  Patients discharged the day of surgery will not be allowed to drive home.    [place]- Preparing For Surgery  Before surgery, you can play an important role. Because skin is not sterile, your skin needs to be as free of germs as possible. You can reduce the number of germs on your skin by  washing with CHG (chlorahexidine gluconate) Soap before surgery.  CHG is an antiseptic cleaner which kills germs and bonds with the skin to continue killing germs even after washing.  Please do not use if you have an allergy to CHG or antibacterial soaps. If your skin becomes reddened/irritated stop using the CHG.  Do not shave (including legs and underarms) for at least 48 hours prior to first CHG shower. It is OK to shave your face.  Please follow these instructions carefully.   1. Shower the NIGHT BEFORE SURGERY and the MORNING OF SURGERY with CHG.   2. If you chose to wash your hair, wash your hair first as usual with your normal shampoo.  3. After you shampoo, rinse your hair and body thoroughly to remove the shampoo.  4. Use CHG as you would any other liquid soap. You can apply CHG directly to the skin and wash gently with a scrungie or a clean washcloth.   5. Apply the CHG Soap to your body ONLY FROM THE NECK DOWN.  Do not use on open wounds or open sores. Avoid contact with your eyes, ears, mouth and genitals (private parts). Wash Face and genitals (private parts)  with your normal soap.  6. Wash thoroughly, paying special attention to the area where your surgery will be performed.  7. Thoroughly rinse your body with warm water from the neck down.  8. DO NOT shower/wash with your normal soap after using and rinsing off the CHG Soap.  9. Pat yourself dry with a CLEAN TOWEL.  10.  Wear CLEAN PAJAMAS to bed the night before surgery, wear comfortable clothes the morning of surgery  11. Place CLEAN SHEETS on your bed the night of your first shower and DO NOT SLEEP WITH PETS.  Day of Surgery: Do not apply any deodorants/lotions. Please wear clean clothes to the hospital/surgery center.     Please read over the following fact sheets that you were given. Pain Booklet, Coughing and Deep Breathing and Surgical Site Infection Prevention

## 2017-08-20 ENCOUNTER — Encounter (HOSPITAL_COMMUNITY)
Admission: RE | Admit: 2017-08-20 | Discharge: 2017-08-20 | Disposition: A | Discharge status: Home / Self Care | Payer: 59 | Source: Ambulatory Visit | Attending: Surgery | Admitting: Surgery

## 2017-08-20 ENCOUNTER — Encounter (HOSPITAL_COMMUNITY): Payer: Self-pay

## 2017-08-20 ENCOUNTER — Other Ambulatory Visit: Payer: Self-pay

## 2017-08-20 DIAGNOSIS — Z01818 Encounter for other preprocedural examination: Secondary | ICD-10-CM | POA: Diagnosis not present

## 2017-08-20 DIAGNOSIS — C50912 Malignant neoplasm of unspecified site of left female breast: Secondary | ICD-10-CM | POA: Insufficient documentation

## 2017-08-20 LAB — CBC
HCT: 37.7 % (ref 36.0–46.0)
HEMOGLOBIN: 12.1 g/dL (ref 12.0–15.0)
MCH: 27.3 pg (ref 26.0–34.0)
MCHC: 32.1 g/dL (ref 30.0–36.0)
MCV: 85.1 fL (ref 78.0–100.0)
PLATELETS: 226 10*3/uL (ref 150–400)
RBC: 4.43 MIL/uL (ref 3.87–5.11)
RDW: 15.3 % (ref 11.5–15.5)
WBC: 7.8 10*3/uL (ref 4.0–10.5)

## 2017-08-20 LAB — BASIC METABOLIC PANEL
ANION GAP: 11 (ref 5–15)
BUN: 14 mg/dL (ref 6–20)
CO2: 21 mmol/L — AB (ref 22–32)
CREATININE: 0.8 mg/dL (ref 0.44–1.00)
Calcium: 9.4 mg/dL (ref 8.9–10.3)
Chloride: 104 mmol/L (ref 101–111)
Glucose, Bld: 103 mg/dL — ABNORMAL HIGH (ref 65–99)
Potassium: 3.6 mmol/L (ref 3.5–5.1)
Sodium: 136 mmol/L (ref 135–145)

## 2017-08-20 MED ORDER — CHLORHEXIDINE GLUCONATE CLOTH 2 % EX PADS: 6.0000 | MEDICATED_PAD | Freq: Once | CUTANEOUS | Status: DC

## 2017-08-20 NOTE — Progress Notes (Signed)
PCP: Willey Blade, MD  Cardiologist: pt denies  EKG: pt denies past year   Stress test: pt denies ever  ECHO: 01/30/18  Cardiac Cath: pt denies ever  Chest x-ray: 06/19/17 in Epic

## 2017-08-21 MED FILL — diazePAM 2 MG TABS: 2 | 15 days supply | Qty: 30 | Fill #0

## 2017-08-21 MED FILL — oxyCODONE HCL 5 MG TABS: 5 | 7 days supply | Qty: 28 | Fill #0

## 2017-08-25 ENCOUNTER — Ambulatory Visit: Payer: Self-pay | Admitting: Plastic Surgery

## 2017-08-25 DIAGNOSIS — C50919 Malignant neoplasm of unspecified site of unspecified female breast: Secondary | ICD-10-CM

## 2017-08-26 MED ORDER — DEXTROSE 5 % IV SOLN
3.0000 g | INTRAVENOUS | Status: AC
  Administered 2017-08-27: 3 g via INTRAVENOUS
  Filled 2017-08-26: qty 3

## 2017-08-26 NOTE — Anesthesia Preprocedure Evaluation (Addendum)
Anesthesia Evaluation  Patient identified by MRN, date of birth, ID band Patient awake    Reviewed: Allergy & Precautions, NPO status , Patient's Chart, lab work & pertinent test results  History of Anesthesia Complications Negative for: history of anesthetic complications  Airway Mallampati: II  TM Distance: >3 FB Neck ROM: Full    Dental  (+) Dental Advisory Given   Pulmonary asthma ,    breath sounds clear to auscultation       Cardiovascular  Rhythm:Regular Rate:Normal  '18 ECHO: EF 60-65%, valves OK   Neuro/Psych negative neurological ROS     GI/Hepatic Neg liver ROS, GERD  Controlled,  Endo/Other  diabetesMorbid obesity  Renal/GU negative Renal ROS     Musculoskeletal   Abdominal (+) + obese,   Peds  Hematology negative hematology ROS (+) plt 226k   Anesthesia Other Findings Breast cancer  Reproductive/Obstetrics S/p BTL                           Anesthesia Physical Anesthesia Plan  ASA: II  Anesthesia Plan: General   Post-op Pain Management: GA combined w/ Regional for post-op pain   Induction: Intravenous  PONV Risk Score and Plan: 4 or greater and Ondansetron, Dexamethasone and Scopolamine patch - Pre-op  Airway Management Planned: Oral ETT  Additional Equipment:   Intra-op Plan:   Post-operative Plan: Extubation in OR  Informed Consent: I have reviewed the patients History and Physical, chart, labs and discussed the procedure including the risks, benefits and alternatives for the proposed anesthesia with the patient or authorized representative who has indicated his/her understanding and acceptance.   Dental advisory given  Plan Discussed with: CRNA and Surgeon  Anesthesia Plan Comments: (Plan routine monitors, GETA with pectoralis block for post op analgesia)        Anesthesia Quick Evaluation

## 2017-08-27 ENCOUNTER — Encounter (HOSPITAL_COMMUNITY): Payer: Self-pay | Admitting: Surgery

## 2017-08-27 ENCOUNTER — Other Ambulatory Visit: Payer: Self-pay

## 2017-08-27 ENCOUNTER — Ambulatory Visit (HOSPITAL_COMMUNITY): Payer: 59 | Admitting: Certified Registered Nurse Anesthetist

## 2017-08-27 ENCOUNTER — Observation Stay (HOSPITAL_COMMUNITY)
Admission: RE | Admit: 2017-08-27 | Discharge: 2017-08-28 | Disposition: A | Discharge status: Home / Self Care | Payer: 59 | Source: Ambulatory Visit | Attending: Plastic Surgery | Admitting: Plastic Surgery

## 2017-08-27 ENCOUNTER — Encounter (HOSPITAL_COMMUNITY)
Admission: RE | Disposition: A | Discharge status: Home / Self Care | Payer: Self-pay | Source: Ambulatory Visit | Attending: Plastic Surgery

## 2017-08-27 DIAGNOSIS — C50212 Malignant neoplasm of upper-inner quadrant of left female breast: Secondary | ICD-10-CM | POA: Diagnosis not present

## 2017-08-27 DIAGNOSIS — C50512 Malignant neoplasm of lower-outer quadrant of left female breast: Secondary | ICD-10-CM | POA: Diagnosis not present

## 2017-08-27 DIAGNOSIS — C50919 Malignant neoplasm of unspecified site of unspecified female breast: Secondary | ICD-10-CM

## 2017-08-27 DIAGNOSIS — C50912 Malignant neoplasm of unspecified site of left female breast: Secondary | ICD-10-CM

## 2017-08-27 DIAGNOSIS — Z170 Estrogen receptor positive status [ER+]: Secondary | ICD-10-CM | POA: Insufficient documentation

## 2017-08-27 DIAGNOSIS — E119 Type 2 diabetes mellitus without complications: Secondary | ICD-10-CM | POA: Insufficient documentation

## 2017-08-27 DIAGNOSIS — Z6841 Body Mass Index (BMI) 40.0 and over, adult: Secondary | ICD-10-CM | POA: Diagnosis not present

## 2017-08-27 DIAGNOSIS — Z9221 Personal history of antineoplastic chemotherapy: Secondary | ICD-10-CM | POA: Diagnosis not present

## 2017-08-27 DIAGNOSIS — C50812 Malignant neoplasm of overlapping sites of left female breast: Secondary | ICD-10-CM | POA: Diagnosis not present

## 2017-08-27 DIAGNOSIS — C773 Secondary and unspecified malignant neoplasm of axilla and upper limb lymph nodes: Secondary | ICD-10-CM | POA: Insufficient documentation

## 2017-08-27 DIAGNOSIS — Z79899 Other long term (current) drug therapy: Secondary | ICD-10-CM | POA: Diagnosis not present

## 2017-08-27 DIAGNOSIS — G8918 Other acute postprocedural pain: Secondary | ICD-10-CM | POA: Diagnosis not present

## 2017-08-27 DIAGNOSIS — K219 Gastro-esophageal reflux disease without esophagitis: Secondary | ICD-10-CM | POA: Diagnosis not present

## 2017-08-27 DIAGNOSIS — Z452 Encounter for adjustment and management of vascular access device: Secondary | ICD-10-CM | POA: Diagnosis not present

## 2017-08-27 HISTORY — PX: MASTECTOMY MODIFIED RADICAL: SUR848

## 2017-08-27 HISTORY — PX: PORT-A-CATH REMOVAL: SHX5289

## 2017-08-27 HISTORY — PX: MASTECTOMY MODIFIED RADICAL: SHX5962

## 2017-08-27 HISTORY — PX: BREAST RECONSTRUCTION WITH PLACEMENT OF TISSUE EXPANDER AND FLEX HD (ACELLULAR HYDRATED DERMIS): SHX6295

## 2017-08-27 LAB — GLUCOSE, CAPILLARY: GLUCOSE-CAPILLARY: 178 mg/dL — AB (ref 65–99)

## 2017-08-27 SURGERY — MASTECTOMY, MODIFIED RADICAL
Anesthesia: General | Site: Chest | Laterality: Right

## 2017-08-27 MED ORDER — HYDROMORPHONE HCL 1 MG/ML IJ SOLN
0.2500 mg | INTRAMUSCULAR | Status: DC | PRN
  Administered 2017-08-27: 0.5 mg via INTRAVENOUS

## 2017-08-27 MED ORDER — FENTANYL CITRATE (PF) 250 MCG/5ML IJ SOLN
INTRAMUSCULAR | Status: AC
  Filled 2017-08-27: qty 5

## 2017-08-27 MED ORDER — BENZONATATE 100 MG PO CAPS: 100.0000 mg | ORAL_CAPSULE | Freq: Three times a day (TID) | ORAL | Status: DC | PRN

## 2017-08-27 MED ORDER — HYDROMORPHONE HCL 1 MG/ML IJ SOLN
INTRAMUSCULAR | Status: AC
  Administered 2017-08-27: 0.5 mg via INTRAVENOUS
  Filled 2017-08-27: qty 1

## 2017-08-27 MED ORDER — SENNA 8.6 MG PO TABS
1.0000 | ORAL_TABLET | Freq: Two times a day (BID) | ORAL | Status: DC
  Administered 2017-08-27 – 2017-08-28 (×2): 8.6 mg via ORAL
  Filled 2017-08-27 (×3): qty 1

## 2017-08-27 MED ORDER — MEPERIDINE HCL 50 MG/ML IJ SOLN: 6.2500 mg | INTRAMUSCULAR | Status: DC | PRN

## 2017-08-27 MED ORDER — KCL IN DEXTROSE-NACL 20-5-0.45 MEQ/L-%-% IV SOLN
INTRAVENOUS | Status: DC
  Administered 2017-08-27: 23:00:00 via INTRAVENOUS

## 2017-08-27 MED ORDER — PROPOFOL 10 MG/ML IV BOLUS
INTRAVENOUS | Status: AC
  Filled 2017-08-27: qty 20

## 2017-08-27 MED ORDER — MONTELUKAST SODIUM 10 MG PO TABS: 10.0000 mg | ORAL_TABLET | Freq: Every day | ORAL | Status: DC | PRN

## 2017-08-27 MED ORDER — CEFAZOLIN SODIUM-DEXTROSE 2-4 GM/100ML-% IV SOLN
2.0000 g | Freq: Three times a day (TID) | INTRAVENOUS | Status: DC
  Administered 2017-08-27 – 2017-08-28 (×3): 2 g via INTRAVENOUS
  Filled 2017-08-27 (×6): qty 100

## 2017-08-27 MED ORDER — VITAMIN D 1000 UNITS PO TABS
1000.0000 [IU] | ORAL_TABLET | Freq: Every day | ORAL | Status: DC
  Administered 2017-08-28: 1000 [IU] via ORAL
  Filled 2017-08-27 (×4): qty 1

## 2017-08-27 MED ORDER — CELECOXIB 200 MG PO CAPS
200.0000 mg | ORAL_CAPSULE | ORAL | Status: AC
  Administered 2017-08-27: 200 mg via ORAL
  Filled 2017-08-27: qty 1

## 2017-08-27 MED ORDER — LIDOCAINE 2% (20 MG/ML) 5 ML SYRINGE
INTRAMUSCULAR | Status: DC | PRN
  Administered 2017-08-27: 30 mg via INTRAVENOUS

## 2017-08-27 MED ORDER — PHENYLEPHRINE HCL 10 MG/ML IJ SOLN
INTRAMUSCULAR | Status: DC | PRN
  Administered 2017-08-27: 80 ug via INTRAVENOUS
  Administered 2017-08-27 (×2): 40 ug via INTRAVENOUS

## 2017-08-27 MED ORDER — SUGAMMADEX SODIUM 200 MG/2ML IV SOLN
INTRAVENOUS | Status: DC | PRN
Start: 2017-08-27 — End: 2017-08-27
  Administered 2017-08-27: 300 mg via INTRAVENOUS

## 2017-08-27 MED ORDER — SODIUM CHLORIDE 0.9 % IV SOLN
INTRAVENOUS | Status: DC | PRN
  Administered 2017-08-27: 500 mL

## 2017-08-27 MED ORDER — OXYCODONE HCL 5 MG PO TABS
5.0000 mg | ORAL_TABLET | Freq: Four times a day (QID) | ORAL | Status: DC | PRN
  Administered 2017-08-27 – 2017-08-28 (×4): 10 mg via ORAL
  Filled 2017-08-27 (×3): qty 2

## 2017-08-27 MED ORDER — MIDAZOLAM HCL 2 MG/2ML IJ SOLN
INTRAMUSCULAR | Status: AC
  Filled 2017-08-27: qty 2

## 2017-08-27 MED ORDER — HYDROCODONE-ACETAMINOPHEN 5-325 MG PO TABS: 1.0000 | ORAL_TABLET | ORAL | Status: DC | PRN

## 2017-08-27 MED ORDER — MIDAZOLAM HCL 2 MG/2ML IJ SOLN
INTRAMUSCULAR | Status: DC | PRN
  Administered 2017-08-27 (×2): 1 mg via INTRAVENOUS

## 2017-08-27 MED ORDER — PROPOFOL 10 MG/ML IV BOLUS
INTRAVENOUS | Status: DC | PRN
  Administered 2017-08-27: 200 mg via INTRAVENOUS

## 2017-08-27 MED ORDER — KETAMINE HCL 10 MG/ML IJ SOLN
INTRAMUSCULAR | Status: AC
  Filled 2017-08-27: qty 1

## 2017-08-27 MED ORDER — KCL IN DEXTROSE-NACL 20-5-0.45 MEQ/L-%-% IV SOLN
INTRAVENOUS | Status: AC
  Administered 2017-08-27: 14:00:00 via INTRAVENOUS
  Filled 2017-08-27 (×2): qty 1000

## 2017-08-27 MED ORDER — FENTANYL CITRATE (PF) 250 MCG/5ML IJ SOLN
INTRAMUSCULAR | Status: DC | PRN
  Administered 2017-08-27 (×4): 50 ug via INTRAVENOUS

## 2017-08-27 MED ORDER — ACETAMINOPHEN 500 MG PO TABS: 1000.0000 mg | ORAL_TABLET | Freq: Four times a day (QID) | ORAL | Status: DC | PRN

## 2017-08-27 MED ORDER — OXYCODONE HCL 5 MG PO TABS
ORAL_TABLET | ORAL | Status: AC
  Filled 2017-08-27: qty 2

## 2017-08-27 MED ORDER — PROMETHAZINE HCL 25 MG/ML IJ SOLN
12.5000 mg | Freq: Four times a day (QID) | INTRAMUSCULAR | Status: DC | PRN
  Administered 2017-08-27: 12.5 mg via INTRAVENOUS
  Filled 2017-08-27: qty 1

## 2017-08-27 MED ORDER — ROCURONIUM BROMIDE 10 MG/ML (PF) SYRINGE
PREFILLED_SYRINGE | INTRAVENOUS | Status: DC | PRN
  Administered 2017-08-27: 50 mg via INTRAVENOUS
  Administered 2017-08-27 (×2): 10 mg via INTRAVENOUS

## 2017-08-27 MED ORDER — IBUPROFEN 600 MG PO TABS
600.0000 mg | ORAL_TABLET | Freq: Four times a day (QID) | ORAL | Status: DC | PRN
  Administered 2017-08-28 (×2): 600 mg via ORAL
  Filled 2017-08-27 (×2): qty 1

## 2017-08-27 MED ORDER — KETAMINE HCL 100 MG/ML IJ SOLN
INTRAMUSCULAR | Status: DC | PRN
  Administered 2017-08-27: 65 mg via INTRAVENOUS

## 2017-08-27 MED ORDER — PROMETHAZINE HCL 25 MG/ML IJ SOLN: 6.2500 mg | INTRAMUSCULAR | Status: DC | PRN

## 2017-08-27 MED ORDER — ONDANSETRON HCL 4 MG/2ML IJ SOLN
4.0000 mg | Freq: Four times a day (QID) | INTRAMUSCULAR | Status: DC | PRN
  Administered 2017-08-27: 4 mg via INTRAVENOUS
  Filled 2017-08-27: qty 2

## 2017-08-27 MED ORDER — HYDROMORPHONE HCL 1 MG/ML IJ SOLN
1.0000 mg | INTRAMUSCULAR | Status: DC | PRN
  Administered 2017-08-27 (×2): 1 mg via INTRAVENOUS
  Filled 2017-08-27 (×2): qty 1

## 2017-08-27 MED ORDER — ACETAMINOPHEN 500 MG PO TABS
1000.0000 mg | ORAL_TABLET | ORAL | Status: AC
  Administered 2017-08-27: 1000 mg via ORAL
  Filled 2017-08-27: qty 2

## 2017-08-27 MED ORDER — ONDANSETRON 4 MG PO TBDP: 4.0000 mg | ORAL_TABLET | Freq: Four times a day (QID) | ORAL | Status: DC | PRN

## 2017-08-27 MED ORDER — ONDANSETRON HCL 4 MG/2ML IJ SOLN
INTRAMUSCULAR | Status: DC | PRN
  Administered 2017-08-27: 4 mg via INTRAVENOUS

## 2017-08-27 MED ORDER — KCL IN DEXTROSE-NACL 20-5-0.45 MEQ/L-%-% IV SOLN: INTRAVENOUS | Status: DC

## 2017-08-27 MED ORDER — LORATADINE 10 MG PO TABS
10.0000 mg | ORAL_TABLET | Freq: Every day | ORAL | Status: DC
  Administered 2017-08-28: 10 mg via ORAL
  Filled 2017-08-27 (×2): qty 1

## 2017-08-27 MED ORDER — TERBINAFINE HCL 250 MG PO TABS
250.0000 mg | ORAL_TABLET | Freq: Every day | ORAL | Status: DC
  Administered 2017-08-28: 250 mg via ORAL
  Filled 2017-08-27 (×2): qty 1

## 2017-08-27 MED ORDER — SCOPOLAMINE 1 MG/3DAYS TD PT72
MEDICATED_PATCH | TRANSDERMAL | Status: DC | PRN
  Administered 2017-08-27: 1 via TRANSDERMAL

## 2017-08-27 MED ORDER — GABAPENTIN 300 MG PO CAPS
300.0000 mg | ORAL_CAPSULE | ORAL | Status: AC
  Administered 2017-08-27: 300 mg via ORAL
  Filled 2017-08-27: qty 1

## 2017-08-27 MED ORDER — SUGAMMADEX SODIUM 500 MG/5ML IV SOLN
INTRAVENOUS | Status: AC
  Filled 2017-08-27: qty 5

## 2017-08-27 MED ORDER — IBUPROFEN 800 MG PO TABS: 800.0000 mg | ORAL_TABLET | Freq: Three times a day (TID) | ORAL | Status: DC | PRN

## 2017-08-27 MED ORDER — POLYETHYLENE GLYCOL 3350 17 G PO PACK: 17.0000 g | PACK | Freq: Every day | ORAL | Status: DC | PRN

## 2017-08-27 MED ORDER — ADULT MULTIVITAMIN W/MINERALS CH
1.0000 | ORAL_TABLET | Freq: Every day | ORAL | Status: DC
  Administered 2017-08-28: 1 via ORAL
  Filled 2017-08-27 (×2): qty 1

## 2017-08-27 MED ORDER — SCOPOLAMINE 1 MG/3DAYS TD PT72
MEDICATED_PATCH | TRANSDERMAL | Status: AC
  Filled 2017-08-27: qty 1

## 2017-08-27 MED ORDER — ACETAMINOPHEN 325 MG PO TABS
325.0000 mg | ORAL_TABLET | Freq: Four times a day (QID) | ORAL | Status: DC
  Administered 2017-08-27 – 2017-08-28 (×4): 325 mg via ORAL
  Filled 2017-08-27 (×4): qty 1

## 2017-08-27 MED ORDER — DEXAMETHASONE SODIUM PHOSPHATE 10 MG/ML IJ SOLN
INTRAMUSCULAR | Status: DC | PRN
  Administered 2017-08-27: 10 mg via INTRAVENOUS

## 2017-08-27 MED ORDER — MIDAZOLAM HCL 2 MG/2ML IJ SOLN: 0.5000 mg | Freq: Once | INTRAMUSCULAR | Status: DC | PRN

## 2017-08-27 MED ORDER — BUPIVACAINE-EPINEPHRINE (PF) 0.5% -1:200000 IJ SOLN
INTRAMUSCULAR | Status: DC | PRN
  Administered 2017-08-27: 30 mL

## 2017-08-27 MED ORDER — LACTATED RINGERS IV SOLN
INTRAVENOUS | Status: DC | PRN
  Administered 2017-08-27 (×2): via INTRAVENOUS

## 2017-08-27 MED ORDER — DIAZEPAM 2 MG PO TABS
2.0000 mg | ORAL_TABLET | Freq: Two times a day (BID) | ORAL | Status: DC | PRN
  Administered 2017-08-27 – 2017-08-28 (×2): 2 mg via ORAL
  Filled 2017-08-27 (×2): qty 1

## 2017-08-27 MED ORDER — 0.9 % SODIUM CHLORIDE (POUR BTL) OPTIME
TOPICAL | Status: DC | PRN
  Administered 2017-08-27 (×2): 1000 mL

## 2017-08-27 MED ORDER — LIDOCAINE IN D5W 4-5 MG/ML-% IV SOLN
1.0000 mg/min | INTRAVENOUS | Status: AC
  Administered 2017-08-27: 25 ug/kg/min via INTRAVENOUS
  Filled 2017-08-27: qty 500

## 2017-08-27 SURGICAL SUPPLY — 58 items
ADH SKN CLS APL DERMABOND .7 (GAUZE/BANDAGES/DRESSINGS) ×4
APPLIER CLIP 9.375 MED OPEN (MISCELLANEOUS) ×6
APR CLP MED 9.3 20 MLT OPN (MISCELLANEOUS) ×4
BAG DECANTER FOR FLEXI CONT (MISCELLANEOUS) ×3 IMPLANT
BINDER BREAST XLRG (GAUZE/BANDAGES/DRESSINGS) ×1 IMPLANT
BIOPATCH RED 1 DISK 7.0 (GAUZE/BANDAGES/DRESSINGS) ×5 IMPLANT
CANISTER SUCT 3000ML PPV (MISCELLANEOUS) ×6 IMPLANT
CHLORAPREP W/TINT 26ML (MISCELLANEOUS) ×4 IMPLANT
CLIP APPLIE 9.375 MED OPEN (MISCELLANEOUS) ×2 IMPLANT
COVER SURGICAL LIGHT HANDLE (MISCELLANEOUS) ×4 IMPLANT
DERMABOND ADVANCED (GAUZE/BANDAGES/DRESSINGS) ×2
DERMABOND ADVANCED .7 DNX12 (GAUZE/BANDAGES/DRESSINGS) ×3 IMPLANT
DRAIN CHANNEL 19F RND (DRAIN) ×6 IMPLANT
DRAPE HALF SHEET 40X57 (DRAPES) ×3 IMPLANT
DRAPE ORTHO SPLIT 77X108 STRL (DRAPES) ×6
DRAPE SURG ORHT 6 SPLT 77X108 (DRAPES) ×4 IMPLANT
DRAPE WARM FLUID 44X44 (DRAPE) ×3 IMPLANT
ELECT BLADE 4.0 EZ CLEAN MEGAD (MISCELLANEOUS) ×3
ELECT CAUTERY BLADE 6.4 (BLADE) ×3 IMPLANT
ELECT REM PT RETURN 9FT ADLT (ELECTROSURGICAL) ×6
ELECTRODE BLDE 4.0 EZ CLN MEGD (MISCELLANEOUS) IMPLANT
ELECTRODE REM PT RTRN 9FT ADLT (ELECTROSURGICAL) ×4 IMPLANT
EVACUATOR SILICONE 100CC (DRAIN) ×6 IMPLANT
GAUZE SPONGE 4X4 12PLY STRL LF (GAUZE/BANDAGES/DRESSINGS) ×1 IMPLANT
GLOVE BIO SURGEON STRL SZ 6.5 (GLOVE) ×8 IMPLANT
GLOVE BIO SURGEON STRL SZ8 (GLOVE) ×3 IMPLANT
GLOVE BIOGEL PI IND STRL 8 (GLOVE) ×2 IMPLANT
GLOVE BIOGEL PI INDICATOR 8 (GLOVE) ×1
GOWN STRL REUS W/ TWL LRG LVL3 (GOWN DISPOSABLE) ×10 IMPLANT
GOWN STRL REUS W/ TWL XL LVL3 (GOWN DISPOSABLE) ×2 IMPLANT
GOWN STRL REUS W/TWL LRG LVL3 (GOWN DISPOSABLE) ×15
GOWN STRL REUS W/TWL XL LVL3 (GOWN DISPOSABLE) ×3
GRAFT FLEX HD 4X16 THICK (Tissue Mesh) ×1 IMPLANT
IMPL EXPANDER BREAST 535CC (Breast) ×1 IMPLANT
IMPLANT BREAST 535CC (Breast) ×1 IMPLANT
IMPLANT EXPANDER BREAST 535CC (Breast) ×2 IMPLANT
KIT BASIN OR (CUSTOM PROCEDURE TRAY) ×6 IMPLANT
KIT ROOM TURNOVER OR (KITS) ×6 IMPLANT
NDL 21 GA WING INFUSION (NEEDLE) IMPLANT
NEEDLE 21 GA WING INFUSION (NEEDLE) ×3 IMPLANT
NS IRRIG 1000ML POUR BTL (IV SOLUTION) ×9 IMPLANT
PACK GENERAL/GYN (CUSTOM PROCEDURE TRAY) ×6 IMPLANT
PAD ABD 8X10 STRL (GAUZE/BANDAGES/DRESSINGS) ×2 IMPLANT
PAD ARMBOARD 7.5X6 YLW CONV (MISCELLANEOUS) ×6 IMPLANT
PIN SAFETY STERILE (MISCELLANEOUS) ×3 IMPLANT
SET ASEPTIC TRANSFER (MISCELLANEOUS) ×1 IMPLANT
SPECIMEN JAR X LARGE (MISCELLANEOUS) ×3 IMPLANT
SUT ETHILON 3 0 FSL (SUTURE) ×5 IMPLANT
SUT MNCRL AB 4-0 PS2 18 (SUTURE) ×7 IMPLANT
SUT MON AB 3-0 SH 27 (SUTURE) ×3
SUT MON AB 3-0 SH27 (SUTURE) ×4 IMPLANT
SUT MON AB 5-0 PS2 18 (SUTURE) ×5 IMPLANT
SUT PDS AB 2-0 CT1 27 (SUTURE) ×10 IMPLANT
SUT SILK 4 0 PS 2 (SUTURE) ×3 IMPLANT
SUT VIC AB 3-0 SH 18 (SUTURE) ×3 IMPLANT
TOWEL OR 17X24 6PK STRL BLUE (TOWEL DISPOSABLE) ×6 IMPLANT
TOWEL OR 17X26 10 PK STRL BLUE (TOWEL DISPOSABLE) ×6 IMPLANT
TRAY FOLEY W/METER SILVER 14FR (SET/KITS/TRAYS/PACK) ×1 IMPLANT

## 2017-08-27 NOTE — H&P (Signed)
Mackenzie Location: [facility] Patient #: [id number] DOB: 1970-08-01 Married / Language: English / Race: Black or African American [name]  History of Present Illness Patient words: Patient returns after neoadjuvant therapy for multifocal left breast cancer. She has significant 4 quadrant disease as well as significant left axillary lymph node involvement. She has completed chemotherapy and her most recent MRI shows a very good response. There is no disease in the right. She is done with chemotherapy. She is ready to proceed to her next step. She feels well overall and is regaining strength.             ADDITIONAL INFORMATION: 1. PROGNOSTIC INDICATORS Results: IMMUNOHISTOCHEMICAL AND MORPHOMETRIC ANALYSIS PERFORMED MANUALLY Estrogen Receptor: 100%, POSITIVE, STRONG STAINING INTENSITY Progesterone Receptor: 60%, POSITIVE, STRONG STAINING INTENSITY REFERENCE RANGE ESTROGEN RECEPTOR NEGATIVE 0% POSITIVE =>1% REFERENCE RANGE PROGESTERONE RECEPTOR NEGATIVE 0% POSITIVE =>1% All controls stained appropriately Mackenzie Laws MD Pathologist, Electronic Signature ( Signed 01/20/2017) 2. PROGNOSTIC INDICATORS Results: IMMUNOHISTOCHEMICAL AND MORPHOMETRIC ANALYSIS PERFORMED MANUALLY Estrogen Receptor: 100%, POSITIVE, STRONG STAINING INTENSITY Progesterone Receptor: 20%, POSITIVE, STRONG STAINING INTENSITY Proliferation Marker Ki67: 20% REFERENCE RANGE ESTROGEN RECEPTOR NEGATIVE 0%   ADDITIONAL INFORMATION: 1. PROGNOSTIC INDICATORS Results: IMMUNOHISTOCHEMICAL AND MORPHOMETRIC ANALYSIS PERFORMED MANUALLY Estrogen Receptor: 100%, POSITIVE, STRONG STAINING INTENSITY Progesterone Receptor: 60%, POSITIVE, STRONG STAINING INTENSITY REFERENCE RANGE ESTROGEN RECEPTOR NEGATIVE 0% POSITIVE =>1% REFERENCE RANGE PROGESTERONE RECEPTOR NEGATIVE 0% POSITIVE =>1% All controls stained appropriately Mackenzie Laws MD Pathologist, Electronic Signature (  Signed 01/20/2017) 2. 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  LABS: None obtained on site today.  EXAM: BILATERAL BREAST MRI WITH AND WITHOUT CONTRAST  TECHNIQUE: Multiplanar, multisequence MR images of both breasts were obtained prior to and following the intravenous administration of 20 ml of MultiHance.  THREE-DIMENSIONAL MR IMAGE RENDERING ON INDEPENDENT WORKSTATION:  Three-dimensional MR images were rendered by post-processing of the original MR data on an independent workstation. The three-dimensional MR images were interpreted, and findings are reported in the following complete MRI report for this study. Three dimensional images were evaluated at the independent DynaCad workstation  COMPARISON: Breast MRI dated 01/22/2017 and previous mammogram, ultrasound and biopsy examinations. Chest CT dated 02/05/2017 and chest CTA dated 03/13/2007.  FINDINGS: Breast composition: c. Heterogeneous fibroglandular tissue.  Background parenchymal enhancement: Mild.  Right breast: No mass or abnormal enhancement.  Left breast: There is minimal residual non mass enhancement involving all 4 quadrants of the left breast. This is most pronounced in the upper inner quadrant with persistent  enhancement kinetics. There is corresponding increased signal intensity in these regions of the breast on the inversion recovery images with a decrease in signal intensity and mild decrease in size. This previously spanned 12 cm in AP dimension and currently spans 10 cm in AP dimension. There is also less skin thickening on the left with no abnormal enhancement.  Lymph nodes: The previously demonstrated enlarged, enhancing left axillary and retropectoral lymph  nodes are significantly smaller. The largest left axillary node previously had a short axis diameter of 2.8 cm on image number 29 of series 3 and currently has a short axis diameter of 1.9 cm on image number 23 of series 2.  A previously demonstrated enlarged retropectoral node had a short axis diameter of 1.4 cm on image number 3 of series 25 previously and currently has a short axis diameter of 0.6 cm on image number 12 of series 2. There is minimal peripheral enhancement in these nodes currently, previously diffusely enhancing.  Ancillary findings: Previously demonstrated probable small cysts in the liver are not as easily visualized today, partly due to motion artifacts on today's examination. On the CT dated 02/05/2017 and CTA dated 03/13/2007, these had features compatible with small cysts without significant change over a 10 year period of time.  IMPRESSION: 1. Positive imaging response to neoadjuvant chemotherapy with a significant decrease in non mass enhancement in all 4 quadrants of the left breast with minimal residual enhancement. There has also been a mild decrease in size of the area of enhancement and inversion recovery high signal intensity in all 4 quadrants with less skin thickening. 2. Improved metastatic left axillary and retropectoral adenopathy with an interval decrease in size of the metastatic nodes and significant decrease in enhancement. 3. No evidence of malignancy on the  right.  RECOMMENDATION: Treatment plan.  BI-RADS CATEGORY 6: Known biopsy-proven malignancy.   Electronically Signed By: [name] M.D. On: 07/06/2017 10:46.  The patient is a 47 year old [name].   Allergies No Known Drug AllergiesAllergies Reconciled  Medication HistoryHydrocodone-Homatropine (5-1.5MG/5ML Syrup, Oral) Active. Benzonatate (100MG Capsule, Oral) Active. Singulair (10MG Tablet, Oral) Active. Vitamin D (Cholecalciferol) (Oral) Specific strength unknown - Active. Calcium (Oral) Specific strength unknown - Active. LORazepam (0.5MG Tablet, Oral) Active. Dexamethasone (4MG Tablet, Oral) Active. Fluconazole (150MG Tablet, Oral) Active. Ondansetron HCl (8MG Tablet, Oral) Active. PreviDent 5000 Booster Plus (1.1% Paste, Dental) Active. Prochlorperazine Maleate (10MG Tablet, Oral) Active. Terbinafine HCl (250MG Tablet, Oral) Active. Nystatin (100000 UNIT/ML Suspension, Mouth/Throat) Active. Diflucan (150MG Tablet, Oral) Active. Medications Reconciled    Vitals 07/10/2017 10:49 AM Weight: 296.2 lb Height: 66in Body Surface Area: 2.36 m Body Mass Index: 47.81 kg/m  Temp.: 99.33F  Pulse: 98 (Regular)  BP: 155/80 (Sitting, Left Arm, Standard)      Physical Exam  General Mental Status-Alert. General Appearance-Consistent with stated age. Hydration-Well hydrated. Voice-Normal.  Head and Neck Head-normocephalic, atraumatic with no lesions or palpable masses. Trachea-midline. Thyroid Gland Characteristics - normal size and consistency.  Chest and Lung Exam Chest and lung exam reveals -quiet, even and easy respiratory effort with no use of accessory muscles and on auscultation, normal breath sounds, no adventitious sounds and normal vocal resonance. Inspection Chest Wall - Normal. Back - normal.  Breast Breast - Left-Symmetric, Non Tender, No Biopsy scars, no Dimpling, No Inflammation,  No Lumpectomy scars, No Mastectomy scars, No Peau d' Orange. Breast - Right-Symmetric, Non Tender, No Biopsy scars, no Dimpling, No Inflammation, No Lumpectomy scars, No Mastectomy scars, No Peau d' Orange. Breast Lump-No Palpable Breast Mass.  Cardiovascular Cardiovascular examination reveals -normal heart sounds, regular rate and rhythm with no murmurs and normal pedal pulses bilaterally.  Neurologic Neurologic evaluation reveals -alert and oriented x 3 with no impairment of recent or remote memory. Mental Status-Normal.  Lymphatic Head & Neck  General Head & Neck Lymphatics: Bilateral - Description - Normal. Axillary  General Axillary Region: Bilateral - Description - Normal. Tenderness - Non Tender.  Assessment & Plan   BREAST CANCER, LEFT (C50.912) Impression: pt needs left MRM and desires reconstruction Report of plastic surgery  Given her initial lymph node burden I do not recommend recommend targeted left axillary  dissection.  Discussed treatment options for breast cancer to include breast conservation vs mastectomy with reconstruction. Pt has decided on mastectomy. Risk include bleeding, infection, flap necrosis, pain, numbness, recurrence, hematoma, other surgery needs. Pt understands and agrees to proceed.  Current Plans You are being scheduled for surgery- Our schedulers will call you.  You should hear from our office's scheduling department within 5 working days about the location, date, and time of surgery. We try to make accommodations for patient's preferences in scheduling surgery, but sometimes the OR schedule or the surgeon's schedule prevents [name] from making those accommodations.  If you have not heard from our office [phone] in 5 working days, call the office and ask for your surgeon's nurse.  If you have other questions about your diagnosis, plan, or surgery, call the office and ask for your surgeon's nurse.  Pt  Education - [facility] Breast Cancer Information Given - [employer] "Breast Journey" Package Pt Education - flb breast cancer surgery: discussed with patient and provided information. Pt Education - [facility] Mastectomy HCI Pt Education - [facility] (After Breast Cancer) Class Info: discussed with patient and provided information.

## 2017-08-27 NOTE — Anesthesia Postprocedure Evaluation (Signed)
Anesthesia Post Note  Patient: Mackenzie [name]  Procedure(s) Performed: LEFT MODIFIED RADICAL MASTECTOMY (Left Breast) REMOVAL PORT-A-CATH RIGHT CHEST (Right Chest) LEFT BREAST RECONSTRUCTION WITH PLACEMENT OF TISSUE EXPANDER AND FLEX HD (Left Breast)     Patient location during evaluation: PACU Anesthesia Type: General Level of consciousness: sedated, oriented and patient cooperative Pain management: pain level controlled Vital Signs Assessment: post-procedure vital signs reviewed and stable Respiratory status: spontaneous breathing, nonlabored ventilation and respiratory function stable Cardiovascular status: blood pressure returned to baseline and stable Postop Assessment: no apparent nausea or vomiting Anesthetic complications: no    Last Vitals:  Vitals Value Taken Time  BP    Temp    Pulse    Resp    SpO2      Last Pain:  Vitals:   08/27/17 1135  TempSrc:   PainSc: Asleep                 Iona Stay,E. Nyna Chilton

## 2017-08-27 NOTE — Op Note (Signed)
Op report    DATE OF OPERATION:  08/27/2017  LOCATION: [facility]  SURGICAL DIVISION: Plastic Surgery  PREOPERATIVE DIAGNOSES:  1. Left Breast cancer.    POSTOPERATIVE DIAGNOSES:  1. Left Breast cancer.   PROCEDURE:  1. Left immediate breast reconstruction with placement of Acellular Dermal Matrix and tissue expanders.  SURGEON: Katrese Shell Sanger Damoni Causby, DO  ASSISTANT: Shawn Rayburn, PA  ANESTHESIA:  General.   COMPLICATIONS: None.   IMPLANTS: Left - Mentor [id number] SMO.  Serial Number [id number], 535 cc with 250 cc placed Acellular Dermal Matrix  4 x 16 cm   INDICATIONS FOR PROCEDURE:  The patient, Mackenzie [name], is a 47 y.o. female born on 1970/11/14, is here for  immediate first stage breast reconstruction with placement of a left tissue expander and Acellular dermal matrix. MRN: [id number]  CONSENT:  Informed consent was obtained directly from the patient. Risks, benefits and alternatives were fully discussed. Specific risks including but not limited to bleeding, infection, hematoma, seroma, scarring, pain, implant infection, implant extrusion, capsular contracture, asymmetry, wound healing problems, and need for further surgery were all discussed. The patient did have an ample opportunity to have her questions answered to her satisfaction.   DESCRIPTION OF PROCEDURE:  The patient was taken to the operating room by the general surgery team. SCDs were placed and IV antibiotics were given. The patient's chest was prepped and draped in a sterile fashion. A time out was performed and the implants to be used were identified.  A left mastectomy was performed.  Once the general surgery team had completed their portion of the case the patient was rendered to the plastic and reconstructive surgery team.  LEFT: The pectoralis major muscle was lifted from the chest wall with release of the lateral edge and lateral inframammary fold.  The pocket was irrigated with  antibiotic solution and hemostasis was achieved with electrocautery.  The ADM was then prepared according to the manufacture guidelines and slits placed to help with postoperative fluid management.  The ADM was then sutured to the inferior and lateral edge of the inframammary fold with 2-0 PDS starting with an interrupted stitch and then a running stitch.  The lateral portion was sutured to with interrupted sutures after the expander was placed.  The expander was prepared according to the manufacture guidelines, the air evacuated and then it was placed under the ADM and pectoralis major muscle.  The inferior and lateral tabs were used to secure the expander to the chest wall with 2-0 PDS.  The drain was placed at the inframammary fold over the ADM and secured to the skin with 3-0 Silk.    The deep layers were closed with 3-0 Monocryl followed by 4-0 Monocryl.  The skin was closed with 5-0 Monocryl and then dermabond was applied.  The ABDs and breast binder were placed.  The patient tolerated the procedure well and there were no complications.  The patient was allowed to wake from anesthesia and taken to the recovery room in satisfactory condition.

## 2017-08-27 NOTE — Anesthesia Procedure Notes (Signed)
Anesthesia Regional Block: Pectoralis block   Pre-Anesthetic Checklist: ,, timeout performed, Correct Patient, Correct Site, Correct Laterality, Correct Procedure, Correct Position, site marked, Risks and benefits discussed,  Surgical consent,  Pre-op evaluation,  At surgeon's request and post-op pain management  Laterality: Left  Prep: chloraprep       Needles:   Needle Type: Echogenic Needle     Needle Length: 9cm  Needle Gauge: 21     Additional Needles:   Procedures:,,,, ultrasound used (permanent image in chart),,,,  Narrative:  Start time: 08/27/2017 7:14 AM End time: 08/27/2017 7:22 AM Injection made incrementally with aspirations every 5 mL.  Performed by: Personally  Anesthesiologist: Annye Asa, MD  Additional Notes: Pt identified in Holding room.  Monitors applied. Working IV access confirmed. Sterile prep, drape L clavicle and pec.  #21ga ECHOgenic needle between pec minor and serratus with US guidance.  30cc 0.5% Bupivacaine with 1:200k epi injected incrementally after negative test dose.  Patient asymptomatic, VSS, no heme aspirated, tolerated well.  Jenita Seashore, MD

## 2017-08-27 NOTE — Interval H&P Note (Signed)
History and Physical Interval Note:  08/27/2017 7:20 AM  Mackenzie [name]  has presented today for surgery, with the diagnosis of LEFT BREAST CANCER  The various methods of treatment have been discussed with the patient and family. After consideration of risks, benefits and other options for treatment, the patient has consented to  Procedure(s): LEFT MODIFIED RADICAL MASTECTOMY (Left) LEFT BREAST RECONSTRUCTION WITH PLACEMENT OF TISSUE EXPANDER AND FLEX HD (ACELLULAR HYDRATED DERMIS) (Left) as a surgical intervention .  The patient's history has been reviewed, patient examined, no change in status, stable for surgery.  I have reviewed the patient's chart and labs.  Questions were answered to the patient's satisfaction.     [place]

## 2017-08-27 NOTE — Op Note (Signed)
Left Modified Radical Mastectomy and port removal  Procedure Note  Indications: This patient presents for surgical treatment of Breast Cancer.The patient has completed chemotherapy and has opted for left modified radical mastectomy and port removal.The surgical and non surgical options have been discussed with the patient.  Risks of surgery include bleeding,  Infection,  Flap necrosis,  Tissue loss,  Chronic pain, death, Numbness,  And the need for additional procedures.  Reconstruction options also have been discussed with the patient as well.  The patient agrees to proceed.  Pre-operative Diagnosis: left breast cancer  Post-operative Diagnosis: left breast cancer  Surgeon: Joyice Faster Geet Hosking   Assistants: Hitchcock RNFA   Anesthesia: General endotracheal anesthesia AND PECTORAL block per anesthesia  ASA Class: 2  Procedure Details  The patient was seen again in the Holding Room. The risks, benefits, indications, potential complications, treatment options, and expected outcomes were discussed with the patient. The possibilities of reaction to medication, pulmonary aspiration, bleeding, recurrent infection, the need for additional procedures, failure to diagnose a condition, and creating a complication requiring transfusion or further operation were discussed with the patient. The patient and/or family concurred with the proposed plan, giving informed consent. The site of surgery was properly noted/marked. The patient was taken to the Operating Room, identified as Mehar C XXXJohnson and the procedure verified as left modified radical mastectomy. A Time Out was held and the above information confirmed.  The patient was placed supine and general anesthetic was administered. The both arms, both breasts, and chest were prepped and draped in standard fashion. An oblique elliptical incision was made encompassing the nipple. Skin flaps were created meticulously to preserve the subdermal blood supply and  maintain a clear margin of resection around the tumor. Dissection was carried down to the pectoralis fascia, which was included with the specimen, and an axillary dissection was performed in continuity with the mastectomy. This included levels I and II. This was accomplished by exposing the axillary vein anteriorly and inferiorly to the level of the pectoralis minor and laterally. Small venous tributaries, lymphatics, and vessels were clipped and ligated or cauterized and divided. The subscapularis muscle was skeletonized. The long thoracic and thoracodorsal neurovascular bundles were identified and preserved.    The specimen was submitted to pathology. The wound was irrigated.  The wound was hemostatic.  The left sided port was visualized.  An incision was made over the previous scar.  The port was visualized and removed in its entirety.  The tract site was closed with 2 0 vicryl.  Hemostasis was achieved.  4 O monocryl used to close the skin.     Dr Marla Roe took over the case for the reconstruction portion.  Please see her note.   Instrument, sponge, and needle counts were correct at closure and at the conclusion of the case.   Findings: as above   Estimated Blood Loss: less than 50 mL         Drains: Two JP drains placed as above         Total IV Fluids per record          Specimens: Breast with axillary contents   Complications: None; patient tolerated the procedure well.         Disposition: PACU - hemodynamically stable.         Condition: stable

## 2017-08-27 NOTE — Transfer of Care (Signed)
Immediate Anesthesia Transfer of Care Note  Patient: Mackenzie [name]  Procedure(s) Performed: LEFT MODIFIED RADICAL MASTECTOMY (Left Breast) REMOVAL PORT-A-CATH RIGHT CHEST (Right Chest) LEFT BREAST RECONSTRUCTION WITH PLACEMENT OF TISSUE EXPANDER AND FLEX HD (Left Breast)  Patient Location: PACU  Anesthesia Type:General  Level of Consciousness: drowsy and patient cooperative  Airway & Oxygen Therapy: Patient Spontanous Breathing and Patient connected to nasal cannula oxygen  Post-op Assessment: Report given to RN and Post -op Vital signs reviewed and stable  Post vital signs: Reviewed and stable  Last Vitals:  Vitals Value Taken Time  BP    Temp    Pulse 84 08/27/2017 10:23 AM  Resp 19 08/27/2017 10:23 AM  SpO2 100 % 08/27/2017 10:23 AM  Vitals shown include unvalidated device data.  Last Pain:  Vitals:   08/27/17 0628  TempSrc: Oral         Complications: No apparent anesthesia complications

## 2017-08-27 NOTE — Anesthesia Procedure Notes (Signed)
Procedure Name: Intubation Date/Time: 08/27/2017 8:05 AM Performed by: Bryson Corona, CRNA Pre-anesthesia Checklist: Patient identified, Emergency Drugs available, Suction available and Patient being monitored Patient Re-evaluated:Patient Re-evaluated prior to induction Oxygen Delivery Method: Circle System Utilized Preoxygenation: Pre-oxygenation with 100% oxygen Induction Type: IV induction Ventilation: Mask ventilation without difficulty Laryngoscope Size: 4 and Mac Grade View: Grade I Tube type: Oral Number of attempts: 1 Airway Equipment and Method: Stylet Placement Confirmation: ETT inserted through vocal cords under direct vision,  positive ETCO2 and breath sounds checked- equal and bilateral Secured at: 23 cm Tube secured with: Tape Dental Injury: Teeth and Oropharynx as per pre-operative assessment

## 2017-08-27 NOTE — H&P (Signed)
Mackenzie [name] is an 47 y.o. female.   Chief Complaint: breast cancer - left HPI: The patient is a 47 yrs old bf here for treatment of breast cancer.   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  She noticed something different about her left breast in July and had a bilateral diagnostic mammogram with tomo.  Her density was category C.  The left breast had calcifications and firmness with skin dimpling.  The U/S showed a mass 2.7 cm from the Nipple at the 9:30 position.  There were also abnormal lymph nodes.  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  The MRI on 8/16 showed no right breast involvement with the left breast enhancement in all quadrants spanning 12 cm.  No nipple or skin involvement was noted.   The genetic tests were negative.  Radiation planned due to lymph node involvement She is 5 feet 7 inches, weight 289 pounds, preop bra 42 -44 C.  She is interested in a left mastectomy.  Past Medical History:  Diagnosis Date  . Anemia   . Breast cancer ([place]) 01/14/2017   Left breast  . Diabetes mellitus    GDM    Past Surgical History:  Procedure Laterality Date  . CESAREAN SECTION     x2  . PORTACATH PLACEMENT Right 01/28/2017   Procedure: INSERTION PORT-A-CATH WITH ULTRASOUND;  Surgeon: Erroll Luna, MD;  Location: [facility];  Service: General;  Laterality: Right;  . TUBAL LIGATION      Family History  Problem Relation Age of Onset  . Breast cancer Paternal Grandmother 27       d.60s from breast cancer. Did not have treatment.  . Other Mother        G.64 from complications of surgery to remove brain tumor   Social History:  reports that she has never smoked. She has never used smokeless tobacco. She reports that she drinks alcohol. She reports that she does not use drugs.  Allergies: No Known  Allergies  Medications Prior to Admission  Medication Sig Dispense Refill  . acetaminophen (TYLENOL) 500 MG tablet Take 1,000 mg by mouth every 6 (six) hours as needed (for pain/headaches.).    Marland Kitchen B Complex Vitamins (VITAMIN B COMPLEX PO) Take 1 capsule by mouth 2 (two) times daily.     . benzonatate (TESSALON) 100 MG capsule Take 100 mg by mouth every 8 (eight) hours as needed for cough.   0  . Calcium Carb-Cholecalciferol (CALCIUM 600+D3 PO) Take 1 tablet by mouth 2 (two) times daily.    . cetirizine (ZYRTEC) 10 MG tablet Take 10 mg by mouth daily.      . Cholecalciferol (VITAMIN D-3) 5000 units TABS Take 5,000 Units by mouth 2 (two) times daily.    Marland Kitchen CRANBERRY PO Take 1 tablet by mouth 4 (four) times daily. 3-4 times daily    . ibuprofen (ADVIL,MOTRIN) 200 MG tablet Take 800 mg by mouth every 8 (eight) hours as needed (for pain.).    Marland Kitchen LORazepam (ATIVAN) 0.5 MG tablet Take 1 tablet (0.5 mg total) by mouth every 8 (eight) hours as needed for anxiety. 30 tablet 0  . Multiple Vitamin (MULTIVITAMIN WITH MINERALS) TABS tablet Take 1 tablet by mouth daily.    . Omega-3 Fatty Acids (FISH OIL) 1000 MG CAPS Take 2,000 mg by mouth daily.     Marland Kitchen  PREVIDENT 5000 BOOSTER PLUS 1.1 % PSTE BRUSH ONCE A DAY IN PLACE OF REGULAR TOOTHPASTE  2  . fluconazole (DIFLUCAN) 150 MG tablet Take 1 tablet (150 mg total) by mouth daily. May repeat x 1 after 7 days if needed (Patient not taking: Reported on 08/19/2017) 2 tablet 0  . loratadine (CLARITIN) 10 MG tablet Take 1 tablet (10 mg total) by mouth daily. (Patient taking differently: Take 10 mg by mouth daily as needed for allergies. ) 90 tablet 2  . montelukast (SINGULAIR) 10 MG tablet Take 10 mg by mouth daily as needed (for allergies.).     Marland Kitchen nystatin (MYCOSTATIN) 100000 UNIT/ML suspension Take 5 mLs (500,000 Units total) by mouth 4 (four) times daily. (Patient not taking: Reported on 08/19/2017) 240 mL 0  . ondansetron (ZOFRAN) 8 MG tablet Take 8 mg by mouth every 8  (eight) hours as needed for nausea or vomiting.    . prochlorperazine (COMPAZINE) 10 MG tablet Take 10 mg by mouth every 6 (six) hours as needed for nausea or vomiting.   1  . prochlorperazine (COMPAZINE) 10 MG tablet TAKE 1 TABLET (10 MG TOTAL) BY MOUTH EVERY 6 (SIX) HOURS AS NEEDED (NAUSEA OR VOMITING). 30 tablet 1  . terbinafine (LAMISIL) 250 MG tablet Take 1 tablet (250 mg total) daily by mouth. 90 tablet 0    No results found for this or any previous visit (from the past 48 hour(s)). No results found.  Review of Systems  Constitutional: Negative.   HENT: Negative.   Eyes: Negative.   Respiratory: Negative.   Cardiovascular: Negative.   Gastrointestinal: Negative.   Genitourinary: Negative.   Musculoskeletal: Negative.   Skin: Negative.   Neurological: Negative.   Psychiatric/Behavioral: Negative.     Blood pressure (!) 153/70, pulse 98, temperature 98 F (36.7 C), temperature source Oral, resp. rate 20, SpO2 98 %, unknown if currently breastfeeding. Physical Exam  Constitutional: She is oriented to person, place, and time. She appears well-developed and well-nourished.  HENT:  Head: Normocephalic and atraumatic.  Eyes: Pupils are equal, round, and reactive to light. EOM are normal.  Cardiovascular: Normal rate and regular rhythm.  Respiratory: Effort normal and breath sounds normal.  GI: Soft.  Neurological: She is alert and oriented to person, place, and time.  Skin: Skin is warm.  Psychiatric: She has a normal mood and affect. Her behavior is normal. Judgment and thought content normal.     Assessment/Plan Plan for left immediate breast reconstruction with expander and FlexHD placement.  [place], DO 08/27/2017, 8:02 AM

## 2017-08-28 ENCOUNTER — Encounter (HOSPITAL_COMMUNITY): Payer: Self-pay | Admitting: Surgery

## 2017-08-28 DIAGNOSIS — C50212 Malignant neoplasm of upper-inner quadrant of left female breast: Secondary | ICD-10-CM | POA: Diagnosis not present

## 2017-08-28 LAB — HIV ANTIBODY (ROUTINE TESTING W REFLEX): HIV SCREEN 4TH GENERATION: NONREACTIVE

## 2017-08-28 MED ORDER — MENTHOL 3 MG MT LOZG
1.0000 | LOZENGE | OROMUCOSAL | Status: DC | PRN
  Filled 2017-08-28: qty 9

## 2017-08-28 MED ORDER — MENTHOL 3 MG MT LOZG: 1.0000 | LOZENGE | OROMUCOSAL | 12 refills | Status: DC | PRN

## 2017-08-28 MED ORDER — ONDANSETRON 4 MG PO TBDP: 4.0000 mg | ORAL_TABLET | Freq: Four times a day (QID) | ORAL | 0 refills | Status: DC | PRN

## 2017-08-28 MED ORDER — OXYCODONE HCL 5 MG PO TABS: 5.0000 mg | ORAL_TABLET | Freq: Four times a day (QID) | ORAL | 0 refills | Status: DC | PRN

## 2017-08-28 MED ORDER — POLYETHYLENE GLYCOL 3350 17 G PO PACK: 17.0000 g | PACK | Freq: Every day | ORAL | 0 refills | Status: DC | PRN

## 2017-08-28 MED ORDER — SENNA 8.6 MG PO TABS: 1.0000 | ORAL_TABLET | Freq: Two times a day (BID) | ORAL | 0 refills | Status: DC

## 2017-08-28 MED ORDER — DIAZEPAM 2 MG PO TABS: 2.0000 mg | ORAL_TABLET | Freq: Two times a day (BID) | ORAL | 0 refills | Status: DC | PRN

## 2017-08-28 NOTE — Progress Notes (Signed)
1 Day Post-Op   Subjective/Chief Complaint: Pt doing well No complaints except some pain    Objective: Vital signs in last 24 hours: Temp:  [97.8 F (36.6 C)-98.1 F (36.7 C)] 97.8 F (36.6 C) (03/22 0543) Pulse Rate:  [72-84] 79 (03/22 0543) Resp:  [15-21] 19 (03/22 0543) BP: (108-140)/(54-85) 121/54 (03/22 0543) SpO2:  [96 %-100 %] 100 % (03/22 0543) Weight:  [132.4 kg (291 lb 14.2 oz)] 132.4 kg (291 lb 14.2 oz) (03/22 0147) Last BM Date: 08/27/17  Intake/Output from previous day: 03/21 0701 - 03/22 0700 In: 1740 [P.O.:240; I.V.:1400; IV Piggyback:100] Out: 1465 [Urine:1130; Drains:235; Blood:100] Intake/Output this shift: Total I/O In: 115 [P.O.:115] Out: 350 [Urine:300; Drains:50]  Incision/Wound:left mastectomy wound intact and clean  Flaps viable  No hematoma   Lab Results:  No results for input(s): WBC, HGB, HCT, PLT in the last 72 hours. BMET No results for input(s): NA, K, CL, CO2, GLUCOSE, BUN, CREATININE, CALCIUM in the last 72 hours. PT/INR No results for input(s): LABPROT, INR in the last 72 hours. ABG No results for input(s): PHART, HCO3 in the last 72 hours.  Invalid input(s): PCO2, PO2  Studies/Results: No results found.  Anti-infectives: Anti-infectives (From admission, onward)   Start     Dose/Rate Route Frequency Ordered Stop   08/27/17 1600  ceFAZolin (ANCEF) IVPB 2g/100 mL premix     2 g 200 mL/hr over 30 Minutes Intravenous Every 8 hours 08/27/17 1218     08/27/17 1300  terbinafine (LAMISIL) tablet 250 mg     250 mg Oral Daily 08/27/17 1218     08/27/17 0720  polymyxin B 500,000 Units, bacitracin 50,000 Units in sodium chloride 0.9 % 500 mL irrigation  Status:  Discontinued       As needed 08/27/17 0843 08/27/17 1020   08/27/17 0600  ceFAZolin (ANCEF) 3 g in dextrose 5 % 50 mL IVPB     3 g 130 mL/hr over 30 Minutes Intravenous On call to O.R. 08/26/17 1338 08/27/17 0809      Assessment/Plan: s/p Procedure(s): LEFT MODIFIED RADICAL  MASTECTOMY (Left) LEFT BREAST RECONSTRUCTION WITH PLACEMENT OF TISSUE EXPANDER AND FLEX HD (Left) REMOVAL PORT-A-CATH RIGHT CHEST (Right) D/C per plastics  Ok with D/C from surgery standpoint  Follow up with me 3 weeks   LOS: 0 days    Marcello Moores A Jeani Fassnacht 08/28/2017

## 2017-08-28 NOTE — Discharge Summary (Signed)
Physician Discharge Summary  Patient ID: Mackenzie [name] MRN: [id number] DOB/AGE: 1970/10/26 47 y.o.  Admit date: 08/27/2017 Discharge date: 08/28/2017  Admission Diagnoses:Left breast cancer  Discharge Diagnoses:  Active Problems:   Breast cancer [facility])   Discharged Condition: good  Hospital Course: Mackenzie [name] is a 47 yo female admitted for treatment of left breast cancer.  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 There were also abnormal lymph nodes. 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 The MRI on 8/16 showed no right breast involvement with the left breast enhancement in all quadrants spanning 12 cm. No nipple or skin involvement was noted. The genetic tests were negative. Radiation planned due to lymph node involvement She has completed chemotherapy and her most recent MRI shows a very good response. There is no disease in the right.  She underwent a left mastectomy and immediate left breast reconstruction with placement of tissue expander and dermal matrix. She did well post operatively and is ready for discharge home with family at this time.   Significant Diagnostic Studies: labs: Pathology pending  Treatments: surgery:LEFT MODIFIED RADICAL MASTECTOMY  LEFT BREAST RECONSTRUCTION WITH PLACEMENT OF TISSUE EXPANDER AND FLEX HD (ACELLULAR HYDRATED DERMIS)    Discharge Exam: Blood pressure 127/72, pulse 88, temperature 98.6 F (37 C), temperature source Oral, resp. rate 19, height _0  (1.702 m), weight 132.4 kg (291 lb 14.2 oz), SpO2 100 %, unknown if currently breastfeeding. General appearance: alert, cooperative and no distress Resp: clear to auscultation bilaterally Cardio: regular rate and rhythm left breast incision is clean, dry and intact and without signs of hematoma or infection or seroma. JP  drainage is moderate and serosanguinous  Disposition: Discharge disposition: 01-Home or Self Care       Discharge Instructions    Call MD for:  redness, tenderness, or signs of infection (pain, swelling, redness, odor or green/yellow discharge around incision site)   Complete by:  As directed    Call MD for:  severe uncontrolled pain   Complete by:  As directed    Call MD for:  temperature >100.4   Complete by:  As directed    Diet - low sodium heart healthy   Complete by:  As directed    Discharge instructions   Complete by:  As directed    Wear breast binder or sports bra except for hygiene.  Sink baths Empty drain several times daily and record volumes and bring record to the office visit   Increase activity slowly   Complete by:  As directed      Allergies as of 08/28/2017   No Known Allergies     Medication List    TAKE these medications   acetaminophen 500 MG tablet Commonly known as:  TYLENOL Take 1,000 mg by mouth every 6 (six) hours as needed (for pain/headaches.).   benzonatate 100 MG capsule Commonly known as:  TESSALON Take 100 mg by mouth every 8 (eight) hours as needed for cough.   CALCIUM 600+D3 PO Take 1 tablet by mouth 2 (two) times daily.   cetirizine 10 MG tablet Commonly known as:  ZYRTEC Take 10 mg by mouth daily.   CRANBERRY PO Take 1 tablet by mouth 4 (four) times daily. 3-4 times daily   diazepam 2 MG tablet Commonly known as:  VALIUM Take 1 tablet (2 mg total) by  mouth every 12 (twelve) hours as needed for muscle spasms.   Fish Oil 1000 MG Caps Take 2,000 mg by mouth daily.   fluconazole 150 MG tablet Commonly known as:  DIFLUCAN Take 1 tablet (150 mg total) by mouth daily. May repeat x 1 after 7 days if needed   ibuprofen 200 MG tablet Commonly known as:  ADVIL,MOTRIN Take 800 mg by mouth every 8 (eight) hours as needed (for pain.).   loratadine 10 MG tablet Commonly known as:  CLARITIN Take 1 tablet (10 mg total) by mouth  daily. What changed:    when to take this  reasons to take this   LORazepam 0.5 MG tablet Commonly known as:  ATIVAN Take 1 tablet (0.5 mg total) by mouth every 8 (eight) hours as needed for anxiety.   menthol-cetylpyridinium 3 MG lozenge Commonly known as:  CEPACOL Take 1 lozenge (3 mg total) by mouth as needed for sore throat (throat irritation / pain).   montelukast 10 MG tablet Commonly known as:  SINGULAIR Take 10 mg by mouth daily as needed (for allergies.).   multivitamin with minerals Tabs tablet Take 1 tablet by mouth daily.   nystatin 100000 UNIT/ML suspension Commonly known as:  MYCOSTATIN Take 5 mLs (500,000 Units total) by mouth 4 (four) times daily.   ondansetron 4 MG disintegrating tablet Commonly known as:  ZOFRAN-ODT Take 1 tablet (4 mg total) by mouth every 6 (six) hours as needed for nausea.   ondansetron 8 MG tablet Commonly known as:  ZOFRAN Take 8 mg by mouth every 8 (eight) hours as needed for nausea or vomiting.   oxyCODONE 5 MG immediate release tablet Commonly known as:  Oxy IR/ROXICODONE Take 1-2 tablets (5-10 mg total) by mouth every 6 (six) hours as needed for moderate pain.   polyethylene glycol packet Commonly known as:  MIRALAX / GLYCOLAX Take 17 g by mouth daily as needed for mild constipation.   PREVIDENT 5000 BOOSTER PLUS 1.1 % Pste Generic drug:  Sodium Fluoride BRUSH ONCE A DAY IN PLACE OF REGULAR TOOTHPASTE   prochlorperazine 10 MG tablet Commonly known as:  COMPAZINE Take 10 mg by mouth every 6 (six) hours as needed for nausea or vomiting.   prochlorperazine 10 MG tablet Commonly known as:  COMPAZINE TAKE 1 TABLET (10 MG TOTAL) BY MOUTH EVERY 6 (SIX) HOURS AS NEEDED (NAUSEA OR VOMITING).   senna 8.6 MG Tabs tablet Commonly known as:  SENOKOT Take 1 tablet (8.6 mg total) by mouth 2 (two) times daily.   terbinafine 250 MG tablet Commonly known as:  LAMISIL Take 1 tablet (250 mg total) daily by mouth.   VITAMIN B  COMPLEX PO Take 1 capsule by mouth 2 (two) times daily.   Vitamin D-3 5000 units Tabs Take 5,000 Units by mouth 2 (two) times daily.      Follow-up Information    Dillingham, Loel Lofty, DO In 1 week.   Specialty:  Plastic Surgery Contact information: [place] [address] [phone]        Erroll Luna, MD In 2 weeks.   Specialty:  General Surgery Contact information: [address] [place] [address] [phone]           Signed: Ulysees [facility]s Plastic Surgery [phone]

## 2017-08-28 NOTE — Progress Notes (Signed)
1 Day Post-Op   Subjective/Chief Complaint: Some N/V post op, but resolved today and taking po's well. Pain under control.   Left breast flaps viable and without signs of hematoma or infection.     Objective: Vital signs in last 24 hours: Temp:  [97.8 F (36.6 C)-98.6 F (37 C)] 98.6 F (37 C) (03/22 1426) Pulse Rate:  [78-88] 88 (03/22 1426) Resp:  [18-19] 19 (03/22 0543) BP: (108-140)/(54-74) 127/72 (03/22 1426) SpO2:  [96 %-100 %] 100 % (03/22 1426) Weight:  [132.4 kg (291 lb 14.2 oz)] 132.4 kg (291 lb 14.2 oz) (03/22 0147) Last BM Date: 08/27/17  Intake/Output from previous day: 03/21 0701 - 03/22 0700 In: 1740 [P.O.:240; I.V.:1400; IV Piggyback:100] Out: 1465 [Urine:1130; Drains:235; Blood:100] Intake/Output this shift: Total I/O In: 375 [P.O.:375] Out: 810 [Urine:700; Drains:110]  General appearance: alert, cooperative and no distress Left breast flap incisions clean, dry and intact. No hematoma or seroma or infection  Lab Results:  No results for input(s): WBC, HGB, HCT, PLT in the last 72 hours. BMET No results for input(s): NA, K, CL, CO2, GLUCOSE, BUN, CREATININE, CALCIUM in the last 72 hours. PT/INR No results for input(s): LABPROT, INR in the last 72 hours. ABG No results for input(s): PHART, HCO3 in the last 72 hours.  Invalid input(s): PCO2, PO2  Studies/Results: No results found.  Anti-infectives: Anti-infectives (From admission, onward)   Start     Dose/Rate Route Frequency Ordered Stop   08/27/17 1600  ceFAZolin (ANCEF) IVPB 2g/100 mL premix     2 g 200 mL/hr over 30 Minutes Intravenous Every 8 hours 08/27/17 1218     08/27/17 1300  terbinafine (LAMISIL) tablet 250 mg     250 mg Oral Daily 08/27/17 1218     08/27/17 0720  polymyxin B 500,000 Units, bacitracin 50,000 Units in sodium chloride 0.9 % 500 mL irrigation  Status:  Discontinued       As needed 08/27/17 0843 08/27/17 1020   08/27/17 0600  ceFAZolin (ANCEF) 3 g in dextrose 5 % 50 mL  IVPB     3 g 130 mL/hr over 30 Minutes Intravenous On call to O.R. 08/26/17 1338 08/27/17 0809      Assessment/Plan: s/p Procedure(s): LEFT MODIFIED RADICAL MASTECTOMY (Left) LEFT BREAST RECONSTRUCTION WITH PLACEMENT OF TISSUE EXPANDER AND FLEX HD (Left) REMOVAL PORT-A-CATH RIGHT CHEST (Right) Discharge  Follow up in office next week.   LOS: 0 days   Gedeon Brandow,PA-C Plastic Surgery [phone]

## 2017-09-01 DIAGNOSIS — C50912 Malignant neoplasm of unspecified site of left female breast: Secondary | ICD-10-CM | POA: Diagnosis not present

## 2017-09-03 NOTE — Op Note (Signed)
First Assist Op Note:  I assisted the Surgeon(s) ___Dr. Audelia Hives on the procedure(s): ______Immediate left breast reconstruction with tissue expander and Flex HD _on Date __3/21/19_______  I provided my assistance on this case as follows:  I was present and acted as first Environmental consultant during this operation.  A time out was performed and all information confirmed to be correct.  I first assisted during the case including retraction for exposure, assisting with closure of surgical wounds and application of sterile dressings. I provided assistance with application of post operative garments/splinting and assisted with patient transfer back to the stretcher as needed.   RAYBURN,SHAWN,PA-C Plastic Surgery [phone]

## 2017-09-09 NOTE — Progress Notes (Signed)
Location of Breast Cancer:Upper inner quadrant stage IIA invasive ductal carcinoma left breast   Histology per Pathology Report: 08--08-18 ADDITIONAL INFORMATION: 1. PROGNOSTIC INDICATORS Results: IMMUNOHISTOCHEMICAL AND MORPHOMETRIC ANALYSIS PERFORMED MANUALLY Estrogen Receptor: 100%, POSITIVE, STRONG STAINING INTENSITY Progesterone Receptor: 60%, POSITIVE, STRONG STAINING INTENSITY   2. PROGNOSTIC INDICATORS Results: IMMUNOHISTOCHEMICAL AND MORPHOMETRIC ANALYSIS PERFORMED MANUALLY Estrogen Receptor: 100%, POSITIVE, STRONG STAINING INTENSITY Progesterone Receptor: 20%, POSITIVE, STRONG STAINING INTENSITY Proliferation Marker Ki67: 20%  2. FLUORESCENCE IN-SITU HYBRIDIZATION Results: HER2 - NEGATIVE RATIO OF HER2/CEP17 SIGNALS 1.38 AVERAGE HER2 COPY NUMBER PER CELL 2.90  Diagnosis 01-14-17 1. Breast, left, needle core biopsy, lower outer - DUCTAL CARCINOMA IN SITU WITH CALCIFICATIONS, SEE COMMENT. 2. Breast, left, needle core biopsy, 9:30 o'clock - INVASIVE DUCTAL CARCINOMA WITH EXTRACELLULAR MUCIN, SEE COMMENT. 3. Lymph node, needle/core biopsy, axilla - METASTATIC CARCINOMA IN ONE LYMPH NODE (1/1).    Receptor Status: ER(), PR (), Her2-neu (), Ki-()  Did patient present with symptoms (if so, please note symptoms) or was this found on screening mammography?:The patient herself noted some changes in her left breast late July 2018, she says, and eventually brought this to medical attention so that on 01/09/2017 she underwent bilateral diagnostic mammography with tomography and left breast ultrasonography at the breast Center.   Past/Anticipated interventions by surgeon, if any:  PROGNOSTIC INDICATORS Results: IMMUNOHISTOCHEMICAL AND MORPHOMETRIC ANALYSIS PERFORMED MANUALLY Estrogen Receptor: 95%, POSITIVE, STRONG STAINING INTENSITY Progesterone Receptor: 5%, POSITIVE, MODERATE STAINING INTENSITY   FLUORESCENCE IN-SITU HYBRIDIZATION Results: HER2 - NEGATIVE RATIO OF  HER2/CEP17 SIGNALS 1.16 AVERAGE HER2 COPY NUMBER PER CELL 2.50 Reference Range: NEGATIVE HER2/CEP17 Ratio <2.0 and average HER2 copy number <4.0 EQUIVOCAL HER2/CEP17 Ratio <2.0 and average HER2 copy number >=4.0 and <6.0 POSITIVE HER2/CEP17 Ratio >=2.0 or <2.0 and average HER2 copy number >=6.0  Diagnosis 08-27-17 Dr. Marcello Moores [name] Breast, modified radical mastectomy , left - INVASIVE DUCTAL CARCINOMA, GRADE II/III, MULTIPLE FOCI THE LARGER FOCI SPAN 9.5 CM AND 1.0 CM. - DUCTAL CARCINOMA IN SITU WITH CALCIFICATIONS, INTERMEDIATE GRADE. - LYMPHOVASCULAR INVASION IS IDENTIFIED. - METASTATIC CARCINOMA IN 6 OF 11 LYMPH NODES (6/11) WITH EXTRACAPSULAR EXTENSION. - THE SURGICAL RESECTION MARGINS ARE NEGATIVE FOR CARCINOMA. - SEE ONCOLOGY TABLE BELOW. Microscopic Comment BREAST, STATUS POST NEOADJUVANT TREATMENT   Receptor Status: ER(100% +), PR (20% +), Her2-neu (No amplification was detected. The ratio was 1.38), Ki-(20 %)      01-28-17   01-28-17 Port-A-Cath Insertion                                                                                                   Past/Anticipated interventions by medical oncology, if any: Dr. Jana [name]   Chemotherapy neoadjuvant chemotherapy consisting of cyclophosphamide and doxorubicin in dose dense fashion 4 starting 02/10/2017, completed 03/24/2017, to be followed by weekly carboplatin and gemcitabine given days 1 and 8 of each 21-day cycle starting 04/14/2017  postmastectomy radiation  adjuvant anti-estrogens    Lymphedema issues, if any: No ROM to left arm limited post op.   Follow up appointment with Dr. Erroll [name] Friday, 09-18-17. Skin to left breast incision line healing with  sutures intact no signs of infection she has noticed a pinkish orange color around the JP insertion site.  JP bottle with 30-35 cc twice a day emptied times the past three weeks.  Pain issues, if any:  4/10 left breast taking Oxycodone mostly at hs and Tylenol  and Ibuprofen  SAFETY ISSUES:  Prior radiation? :No  Pacemaker/ICD? : No  Possible current pregnancy? :No  Is the patient on methotrexate? : No  Current Complaints / other details:  Paternal Grandmother breast cancer   Menarche 61  G4 P4  Menopause 46 chemotherapy  HRT No Wt Readings from Last 3 Encounters:  09/15/17 287 lb 6.4 oz (130.4 kg)  08/20/17 291 lb 14.2 oz (132.4 kg)  07/06/17 296 lb 9.6 oz (134.5 kg)  BP 126/76 (BP Location: Right Arm, Patient Position: Sitting, Cuff Size: Large)   Pulse 92   Temp 98.1 F (36.7 C) (Oral)   Resp (!) 22   Ht '5\' 7"'$  (1.702 m)   Wt 287 lb 6.4 oz (130.4 kg)   SpO2 100%   BMI 45.01 kg/m   Mackenzie Spurling, RN 09/09/2017,12:23 PM

## 2017-09-15 ENCOUNTER — Ambulatory Visit
Admission: RE | Admit: 2017-09-15 | Discharge: 2017-09-15 | Disposition: A | Discharge status: Home / Self Care | Payer: 59 | Source: Ambulatory Visit | Attending: Radiation Oncology | Admitting: Radiation Oncology

## 2017-09-15 ENCOUNTER — Encounter: Payer: Self-pay | Admitting: *Deleted

## 2017-09-15 ENCOUNTER — Encounter: Payer: Self-pay | Admitting: Radiation Oncology

## 2017-09-15 VITALS — BP 126/76 | HR 92 | Temp 98.1°F | Resp 22 | Ht 67.0 in | Wt 287.4 lb

## 2017-09-15 DIAGNOSIS — C50812 Malignant neoplasm of overlapping sites of left female breast: Secondary | ICD-10-CM

## 2017-09-15 DIAGNOSIS — M25612 Stiffness of left shoulder, not elsewhere classified: Secondary | ICD-10-CM | POA: Diagnosis not present

## 2017-09-15 DIAGNOSIS — Z170 Estrogen receptor positive status [ER+]: Secondary | ICD-10-CM | POA: Diagnosis not present

## 2017-09-15 DIAGNOSIS — Z9012 Acquired absence of left breast and nipple: Secondary | ICD-10-CM

## 2017-09-15 DIAGNOSIS — C50212 Malignant neoplasm of upper-inner quadrant of left female breast: Secondary | ICD-10-CM | POA: Diagnosis not present

## 2017-09-15 DIAGNOSIS — C773 Secondary and unspecified malignant neoplasm of axilla and upper limb lymph nodes: Secondary | ICD-10-CM | POA: Diagnosis not present

## 2017-09-15 HISTORY — DX: Acquired absence of left breast and nipple: Z90.12

## 2017-09-15 NOTE — Progress Notes (Signed)
[place] Psychosocial Distress Screening Clinical Social Work  Clinical Social Work was referred by distress screening protocol.  The patient scored a 7 on the Psychosocial Distress Thermometer which indicates moderate distress. Clinical Social Worker contacted patient at home to assess for distress and other psychosocial needs.  Clinical Social Worker was unable to reach patient or leave a message due to no voicemail option.  CSW will continue to attempt to reach patient and/or follow up at next appointment.      ONCBCN DISTRESS SCREENING 09/15/2017  Screening Type Initial Screening  Distress experienced in past week (1-10) 7  Practical problem type Housing  Family Problem type Partner  Emotional problem type Nervousness/Anxiety  Information Concerns Type   Physical Problem type Pain;Tingling hands/feet  Physician notified of physical symptoms Yes  Referral to clinical social work      Johnnye Lana, MSW, LCSW, OSW-C Clinical Social Worker [place] [phone]

## 2017-09-16 ENCOUNTER — Encounter: Payer: Self-pay | Admitting: General Practice

## 2017-09-16 ENCOUNTER — Other Ambulatory Visit: Payer: Self-pay | Admitting: Radiation Oncology

## 2017-09-16 DIAGNOSIS — Z170 Estrogen receptor positive status [ER+]: Principal | ICD-10-CM

## 2017-09-16 DIAGNOSIS — C50812 Malignant neoplasm of overlapping sites of left female breast: Secondary | ICD-10-CM

## 2017-09-16 NOTE — Progress Notes (Signed)
[place]  [phone] Fax:(336) [phone]     ID: Mackenzie [name] DOB: [birth year]  MR#: [id number]  YWV#:[id number]  Patient Care Team: Willey Blade, MD as PCP - General (Internal Medicine) Shishir Krantz, [name], MD as Consulting Physician (Oncology) Erroll Luna, MD as Consulting Physician (General Surgery) Kyung Rudd, MD as Consulting Physician (Radiation Oncology) Dillingham, Loel Lofty, DO as Attending Physician (Plastic Surgery) OTHER MD:  CHIEF COMPLAINT: Estrogen receptor positive breast cancer  CURRENT TREATMENT: Adjuvant radiation pending   HISTORY OF CURRENT ILLNESS: From the original intake note:  The patient herself noted some changes in her left breast late July 2018, she says, and eventually brought this to medical attention so that on 01/09/2017 she underwent bilateral diagnostic mammography with tomography and left breast ultrasonography at the breast Center. This found the breast density to be category C. In the upper inner quadrant of the left breast there was an area of asymmetry and there were malignant type calcifications involving all 4 quadrants. On exam there is firmness and palpable thickening in the anterior left breast with skin dimpling. Ultrasonography found at the 9:30 o'clock radiant 3 cm from the nipple a 2.7 cm mass and in the left axilla for abnormal lymph nodes largest of which measured 2.7 cm.  Biopsy of the left breast 9:30 o'clock mass 80 01/26/2017 showed ([id number]) invasive ductal carcinoma, with extracellular mucin, grade 1 or 2. In the lower outer left breast is separated biopsy the same day showed ductal carcinoma in situ. One of the 4 lymph nodes involved was positive for metastatic carcinoma. Prognostic panel on the invasive disease showed it to be estrogen receptor 100% positive, progesterone receptor 20% positive, both with strong staining intensity, with an MIB-1 of 20%, and no HER-2 amplification with a  signals ratio 1.38 and the number per cell 2.90.  On 01/22/2017 the patient underwent bilateral breast MRI. This showed no involvement of the right breast, but in the left breast there was a masslike and non-masslike enhancement involving all quadrants, with skin swelling but no abnormal enhancement of the skin or nipple areolar complex or pectoralis muscle. The mass could not be clearly measured but spanned approximately 12 cm. There was bulky left axillary lymphadenopathy, with the largest lymph node measuring up to 3.4 cm.  The patient's subsequent history is as detailed below.  INTERVAL HISTORY: Mackenzie [name] returns today for evaluation and treatment of her estrogen receptor positive breast cancer. She is accompanied by her daughter.    Since her last visit here she underwent left modified radical mastectomy, on 08/27/2017.  This showed (S [id number]) invasive ductal carcinoma, grade 2, in multiple foci, the largest spanning 9.5 cm, and the next largest 1.0 cm.  Margins were negative.  11 lymph nodes were removed, of which 6 showed macro metastases.  The prognostic panel was repeated.  It was estrogen receptor 95% positive with strong staining intensity, progesterone receptor 5% positive, with moderate staining intensity, and HER-2 negative, with an ratio of signals of 1.16 and number per cell 2.50.  Her case was re-presented at the 09/09/2017 multidisciplinary breast cancer conference.  At that point it was felt that she would benefit from capecitabine sensitization during radiation.    REVIEW OF SYSTEMS: Dylanie tolerated surgery well. She has not been given any exercises to do just yet. She still has a drain in place. After waking up from surgery she did experience nausea for a few hours after waking up. The last few days she  has been walking. She will be starting radiation with Dr. Lisbeth Renshaw in the near future. She denies unusual headaches, visual changes, vomiting, or dizziness. There has been no  unusual cough, phlegm production, or pleurisy. This been no change in bowel or bladder habits. She denies unexplained fatigue or unexplained weight loss, bleeding, rash, or fever. A detailed review of systems was otherwise noncontributory.     PAST MEDICAL HISTORY: Past Medical History:  Diagnosis Date  . Anemia   . Breast cancer ([facility]) 01/14/2017   Left breast  . Diabetes mellitus    GDM    PAST SURGICAL HISTORY: Past Surgical History:  Procedure Laterality Date  . BREAST RECONSTRUCTION WITH PLACEMENT OF TISSUE EXPANDER AND FLEX HD (ACELLULAR HYDRATED DERMIS) Left 08/27/2017   Procedure: LEFT BREAST RECONSTRUCTION WITH PLACEMENT OF TISSUE EXPANDER AND FLEX HD;  Surgeon: Wallace Going, DO;  Location: [facility];  Service: Plastics;  Laterality: Left;  . CESAREAN SECTION     x2  . MASTECTOMY MODIFIED RADICAL Left 08/27/2017  . MASTECTOMY MODIFIED RADICAL Left 08/27/2017   Procedure: LEFT MODIFIED RADICAL MASTECTOMY;  Surgeon: Erroll Luna, MD;  Location: [facility];  Service: General;  Laterality: Left;  . PORT-A-CATH REMOVAL Right 08/27/2017   Procedure: REMOVAL PORT-A-CATH RIGHT CHEST;  Surgeon: Erroll Luna, MD;  Location: [facility];  Service: General;  Laterality: Right;  . PORTACATH PLACEMENT Right 01/28/2017   Procedure: INSERTION PORT-A-CATH WITH ULTRASOUND;  Surgeon: Erroll Luna, MD;  Location: [facility];  Service: General;  Laterality: Right;  . TUBAL LIGATION      FAMILY HISTORY Family History  Problem Relation Age of Onset  . Breast cancer Paternal Grandmother 19       d.60s from breast cancer. Did not have treatment.  . Other Mother        I.34 from complications of surgery to remove brain tumor  The patient's father still alive at age 98. The patient's mother died with a brain tumor which the patient says was "benign". She was 56. The patient has one brother, no sisters. The only breast cancer in the family is a paternal grandmother who died from breast  cancer at an unknown age. There is no history of ovarian or prostate cancer in the family.  GYNECOLOGIC HISTORY:  No LMP recorded. (Menstrual status: Chemotherapy). Menarche age 38 and first live birth age 38 she is Jakin P4. The person is still having regular periods as of August 2018  SOCIAL HISTORY: (As of August 2018) Caitlyne works at [employer] on the fifth floor, [place]. Her husband, [name], is a Building control surveyor. Daughter and married and lives in [place] and works as a Education administrator. Daughter, [name] lives in [place] and works in the police department. Daughters, The Colony and Ceex Haci are 9 and 6, living at home. The patient has no grandchildren. She attends a Tour manager   ADVANCED DIRECTIVES: Not in place   HEALTH MAINTENANCE: Social History   Tobacco Use  . Smoking status: Never Smoker  . Smokeless tobacco: Never Used  Substance Use Topics  . Alcohol use: Yes    Comment: social  . Drug use: No     Colonoscopy: Never  PAP: December 2017  Bone density: Never   No Known Allergies  Current Outpatient Medications  Medication Sig Dispense Refill  . acetaminophen (TYLENOL) 500 MG tablet Take 1,000 mg by mouth every 6 (six) hours as needed (for pain/headaches.).    Marland Kitchen B Complex Vitamins (VITAMIN B COMPLEX PO) Take 1 capsule  by mouth 2 (two) times daily.     . benzonatate (TESSALON) 100 MG capsule Take 100 mg by mouth every 8 (eight) hours as needed for cough.   0  . Calcium Carb-Cholecalciferol (CALCIUM 600+D3 PO) Take 1 tablet by mouth 2 (two) times daily.    . cetirizine (ZYRTEC) 10 MG tablet Take 10 mg by mouth daily.      . Cholecalciferol (VITAMIN D-3) 5000 units TABS Take 5,000 Units by mouth 2 (two) times daily.    Marland Kitchen CRANBERRY PO Take 1 tablet by mouth 4 (four) times daily. 3-4 times daily    . diazepam (VALIUM) 2 MG tablet Take 1 tablet (2 mg total) by mouth every 12 (twelve) hours as needed for muscle spasms. 30 tablet 0  . ibuprofen (ADVIL,MOTRIN) 200 MG  tablet Take 800 mg by mouth every 8 (eight) hours as needed (for pain.).    Marland Kitchen loratadine (CLARITIN) 10 MG tablet Take 1 tablet (10 mg total) by mouth daily. (Patient not taking: Reported on 09/15/2017) 90 tablet 2  . LORazepam (ATIVAN) 0.5 MG tablet Take 1 tablet (0.5 mg total) by mouth every 8 (eight) hours as needed for anxiety. 30 tablet 0  . menthol-cetylpyridinium (CEPACOL) 3 MG lozenge Take 1 lozenge (3 mg total) by mouth as needed for sore throat (throat irritation / pain). 100 tablet 12  . montelukast (SINGULAIR) 10 MG tablet Take 10 mg by mouth daily as needed (for allergies.).     Marland Kitchen Multiple Vitamin (MULTIVITAMIN WITH MINERALS) TABS tablet Take 1 tablet by mouth daily.    Marland Kitchen nystatin (MYCOSTATIN) 100000 UNIT/ML suspension Take 5 mLs (500,000 Units total) by mouth 4 (four) times daily. (Patient not taking: Reported on 08/19/2017) 240 mL 0  . Omega-3 Fatty Acids (FISH OIL) 1000 MG CAPS Take 2,000 mg by mouth daily.     . ondansetron (ZOFRAN) 8 MG tablet Take 8 mg by mouth every 8 (eight) hours as needed for nausea or vomiting.    . ondansetron (ZOFRAN-ODT) 4 MG disintegrating tablet Take 1 tablet (4 mg total) by mouth every 6 (six) hours as needed for nausea. (Patient not taking: Reported on 09/15/2017) 20 tablet 0  . oxyCODONE (OXY IR/ROXICODONE) 5 MG immediate release tablet Take 1-2 tablets (5-10 mg total) by mouth every 6 (six) hours as needed for moderate pain. 30 tablet 0  . polyethylene glycol (MIRALAX / GLYCOLAX) packet Take 17 g by mouth daily as needed for mild constipation. (Patient not taking: Reported on 09/15/2017) 14 each 0  . PREVIDENT 5000 BOOSTER PLUS 1.1 % PSTE BRUSH ONCE A DAY IN PLACE OF REGULAR TOOTHPASTE  2  . prochlorperazine (COMPAZINE) 10 MG tablet Take 10 mg by mouth every 6 (six) hours as needed for nausea or vomiting.   1  . senna (SENOKOT) 8.6 MG TABS tablet Take 1 tablet (8.6 mg total) by mouth 2 (two) times daily. 120 each 0  . terbinafine (LAMISIL) 250 MG tablet Take 1  tablet (250 mg total) daily by mouth. 90 tablet 0   No current facility-administered medications for this visit.    Facility-Administered Medications Ordered in Other Visits  Medication Dose Route Frequency Provider Last Rate Last Dose  . sodium chloride flush (NS) 0.9 % injection 10 mL  10 mL Intravenous PRN Anber Mckiver, [name], MD   10 mL at 03/10/17 1239    OBJECTIVE: Middle-aged African-American woman who appears stated age  27:   09/17/17 1025  BP: (!) 150/82  Pulse: 93  Resp:  18  Temp: 98.2 F (36.8 C)  SpO2: 99%     Body mass index is 44.76 kg/m.   Wt Readings from Last 3 Encounters:  09/17/17 285 lb 12.8 oz (129.6 kg)  09/15/17 287 lb 6.4 oz (130.4 kg)  08/20/17 291 lb 14.2 oz (132.4 kg)  ECOG FS:1   Sclerae unicteric, pupils round and equal Oropharynx clear and moist No cervical or supraclavicular adenopathy Lungs no rales or rhonchi Heart regular rate and rhythm Abd soft, obese, nontender, positive bowel sounds MSK no focal spinal tenderness, no upper extremity lymphedema Neuro: nonfocal, well oriented, appropriate affect Breasts: The right breast is benign.  The left side is status post recent mastectomy with expander in place.  The incision is healing nicely, without edema or erythema.  The left-sided drain has approximately 20 cc of serosanguineous fluid in the bulb.  Both axillae are benign.   LAB RESULTS:    Appointment on 09/17/2017  Component Date Value Ref Range Status  . WBC 09/17/2017 4.7  3.9 - 10.3 K/uL Final  . RBC 09/17/2017 4.11  3.70 - 5.45 MIL/uL Final  . Hemoglobin 09/17/2017 11.2* 11.6 - 15.9 g/dL Final  . HCT 09/17/2017 34.2* 34.8 - 46.6 % Final  . MCV 09/17/2017 83.1  79.5 - 101.0 fL Final  . MCH 09/17/2017 27.2  25.1 - 34.0 pg Final  . MCHC 09/17/2017 32.7  31.5 - 36.0 g/dL Final  . RDW 09/17/2017 14.8* 11.2 - 14.5 % Final  . Platelets 09/17/2017 249  145 - 400 K/uL Final  . Neutrophils Relative % 09/17/2017 66  % Final  .  Neutro Abs 09/17/2017 3.1  1.5 - 6.5 K/uL Final  . Lymphocytes Relative 09/17/2017 21  % Final  . Lymphs Abs 09/17/2017 1.0  0.9 - 3.3 K/uL Final  . Monocytes Relative 09/17/2017 11  % Final  . Monocytes Absolute 09/17/2017 0.5  0.1 - 0.9 K/uL Final  . Eosinophils Relative 09/17/2017 1  % Final  . Eosinophils Absolute 09/17/2017 0.0  0.0 - 0.5 K/uL Final  . Basophils Relative 09/17/2017 1  % Final  . Basophils Absolute 09/17/2017 0.0  0.0 - 0.1 K/uL Final   Performed at [facility], [place] [address] [place], [place] [address]       STUDIES: Surgical pathology discussed with the patient who received a copy  ELIGIBLE FOR AVAILABLE RESEARCH PROTOCOL: no  ASSESSMENT: 47 y.o. [place] woman status post left breast upper inner quadrant biopsy 01/14/2017 for a clinical T3 N2, stage IIA invasive ductal carcinoma, grade 1 or 2, estrogen and progesterone receptor positive, HER-2 nonamplified, with an MIB-1 of 20%.  (1) staging studies: Brain MRI, bone scan, and CT scan of the chest 02/05/2017 showed no brain lesions, no lung or liver lesions, a 4.9 cm mass in the left breast with left axillary and subpectoral adenopathy, and nonspecific bone scan tracer at L2, left scapula, and anterior ribs, with lumbar spine MRI suggested for further evaluation.  (a) lumbar spine MRI 02/17/2017 showed no normal bone lesions.  There was mild lumbar spondylosis  (2) neoadjuvant chemotherapy consisting of cyclophosphamide and doxorubicin in dose dense fashion 4 started 02/10/2017, completed 03/24/2017, followed by weekly carboplatin and gemcitabine given days 1 and 8 of each 21-day cycle starting 04/14/2017, completing the planned 4 cycles 06/26/2017  (3) is post left modified radical mastectomy on 08/27/2017 showing an mpT3 pN2 residual invasive ductal carcinoma, grade 2, with a residual cancer burden of 3.  Margins were clear  (  4) postmastectomy radiation is indicated  (a) capecitabine  radiosensitization  (5) goserelin started 09/21/2017  (a) anastrozole to start 10/19/2017  (6) genetics testing 03/24/2017 through the Common Hereditary Cancer Panel offered by [facility] found no deleterious mutations in APC, ATM, AXIN2, BARD1, BMPR1A, BRCA1, BRCA2, BRIP1, CDH1, CDKN2A (p14ARF), CDKN2A (p16INK4a), CHEK2, CTNNA1, DICER1, EPCAM (Deletion/duplication testing only), GREM1 (promoter region deletion/duplication testing only), KIT, MEN1, MLH1, MSH2, MSH3, MSH6, MUTYH, NBN, NF1, NHTL1, PALB2, PDGFRA, PMS2, POLD1, POLE, PTEN, RAD50, RAD51C, RAD51D, SDHB, SDHC, SDHD, SMAD4, SMARCA4. STK11, TP53, TSC1, TSC2, and VHL.  The following genes were evaluated for sequence changes only: SDHA and HOXB13 [id number] variant only.    PLAN:  Robi did well with her surgery and is making a good recovery.  She did have a significant residual tumor burden.  We are going to add capecitabine to her radiation treatments.  She has the expander in place.  The plan is to expand to the desired volume before starting radiation.  Accordingly it may be prudent to start antiestrogens now.  She had a period as recently as 10 months ago.  She may well be in menopause secondary to chemotherapy.  However I think it would be safer to start goserelin now.  We discussed the possible toxicities side effects and complications of this agent and she is agreeable to start next week.  With her second goserelin shot she will start anastrozole.  The plan will be to continue these 2 drugs for at least 2 years at which time we may discontinue the goserelin and continue the anastrozole for a total of 7 years  It will be prudent to restage her after she has recovered from her radiation treatments  She knows to call for any other issues that may develop before her next visit.  Rokia Bosket, [name], MD  09/17/17 10:57 AM Medical Oncology and Hematology [facility] [address] [place], [place] [address] Tel.  [phone]    Fax. [phone]  This document serves as a record of services personally performed by Chauncey Cruel, MD. It was created on his behalf by Margit Banda, a trained medical scribe. The creation of this record is based on the scribe's personal observations and the provider's statements to them.   I have reviewed the above documentation for accuracy and completeness, and I agree with the above.

## 2017-09-16 NOTE — Progress Notes (Signed)
[facility] CSW Progress Notes  CSW attempted to reach patient to complete Distress Screen follow up, no answer on mobile phone.  Did not leave message on home phone as it was not personally identified.  Will attempt to reach at upcoming MD visit on 4/11.  Edwyna Shell, LCSW Clinical Social Worker Phone:  [phone]

## 2017-09-16 NOTE — Progress Notes (Addendum)
Radiation Oncology         [phone] ________________________________  Name: Mackenzie [name]        MRN: [id number]  Date of Service: 09/15/2017 DOB: 05/09/1971  EN:IDPOEUM, Joelene Millin, MD  Erroll Luna, MD     REFERRING PHYSICIAN: Erroll Luna, MD   DIAGNOSIS: The encounter diagnosis was Malignant 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, estrogen receptor positive ([place]).   HISTORY OF PRESENT ILLNESS: Mackenzie [name] is a 47 y.o. female originally seen at the request of Dr. Brantley Stage for a new diagnosis of left breast cancer. The patient self palpated a mass in the left breast at the beginning of August. She was seen in the ED and set up for diagnostic imaging including tomo mammogram and left breast ultrasound. Assymetry and calcifications were noted in all quadrants of the left breast, clustering mostly in the upper inner quadrant. There was slight trabecular thickening, and ultrasound revealed a mass at 9:30 o'clock measuring 2.7 x 2.6 x 3.1 cm and there were 4 abnormal nodes in the left axilla. 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 The largest axillary node was also biopsied and consistent with metastatic disease. Bilateral breast MRI on 01/22/17 revealed extensive mass like and non masslike enhancement of the upper inner left breast involving the entire central to lower left breast with enlargement of the left breast with skin edema, however no enhancement wof the nipple areola complex was seen. The span of the mass is measured at 12 cm in the AP dimension. She met with Dr. Jana Hakim who has recommended neoadjuvant chemotherapy and began chemotherapy on 02/10/17 and received Cytoxan/Adriamycin. Shecontinue with with Carboplatin/Gemzar until 06/26/17. She had repeat staging MRI which  revealed decreased enhancement in the left breast, and improved disease in the left axillary and retropectoral adenopathy. She underwent left mastectomy with tissue expander placement on 08/27/17. Final pathology revealed a 9.5 and 1 cm grade 2 invasive ductal carcinoma with intermediate grade DCIS with calcifications and LVSI. Her margins were negative, but of the 11 nodes removed, 6 were positive for metastatic disease. She comes today to discuss options of adjuvant radiotherapy.   PREVIOUS RADIATION THERAPY: No   PAST MEDICAL HISTORY:  Past Medical History:  Diagnosis Date  . Anemia   . Breast cancer ([place]) 01/14/2017   Left breast  . Diabetes mellitus    GDM       PAST SURGICAL HISTORY: Past Surgical History:  Procedure Laterality Date  . BREAST RECONSTRUCTION WITH PLACEMENT OF TISSUE EXPANDER AND FLEX HD (ACELLULAR HYDRATED DERMIS) Left 08/27/2017   Procedure: LEFT BREAST RECONSTRUCTION WITH PLACEMENT OF TISSUE EXPANDER AND FLEX HD;  Surgeon: Wallace Going, DO;  Location: [facility];  Service: Plastics;  Laterality: Left;  . CESAREAN SECTION     x2  . MASTECTOMY MODIFIED RADICAL Left 08/27/2017  . MASTECTOMY MODIFIED RADICAL Left 08/27/2017   Procedure: LEFT MODIFIED RADICAL MASTECTOMY;  Surgeon: Erroll Luna, MD;  Location: [facility];  Service: General;  Laterality: Left;  . PORT-A-CATH REMOVAL Right 08/27/2017   Procedure: REMOVAL PORT-A-CATH RIGHT CHEST;  Surgeon: Erroll Luna, MD;  Location: [facility];  Service: General;  Laterality: Right;  . PORTACATH PLACEMENT Right 01/28/2017   Procedure: INSERTION PORT-A-CATH WITH ULTRASOUND;  Surgeon: Erroll Luna, MD;  Location: [place];  Service: General;  Laterality: Right;  . TUBAL LIGATION       FAMILY HISTORY:  Family History  Problem Relation Age of Onset  . Breast cancer Paternal Grandmother 85       d.60s from breast cancer. Did not have treatment.  . Other Mother        V.37 from complications of  surgery to remove brain tumor     SOCIAL HISTORY:  reports that she has never smoked. She has never used smokeless tobacco. She reports that she drinks alcohol. She reports that she does not use drugs. The patient is married and is accompanied by her husband, and family. She is a Marine scientist at [employer]: Patient has no known allergies.   MEDICATIONS:  Current Outpatient Medications  Medication Sig Dispense Refill  . acetaminophen (TYLENOL) 500 MG tablet Take 1,000 mg by mouth every 6 (six) hours as needed (for pain/headaches.).    Marland Kitchen B Complex Vitamins (VITAMIN B COMPLEX PO) Take 1 capsule by mouth 2 (two) times daily.     . Calcium Carb-Cholecalciferol (CALCIUM 600+D3 PO) Take 1 tablet by mouth 2 (two) times daily.    . cetirizine (ZYRTEC) 10 MG tablet Take 10 mg by mouth daily.      . Cholecalciferol (VITAMIN D-3) 5000 units TABS Take 5,000 Units by mouth 2 (two) times daily.    . diazepam (VALIUM) 2 MG tablet Take 1 tablet (2 mg total) by mouth every 12 (twelve) hours as needed for muscle spasms. 30 tablet 0  . ibuprofen (ADVIL,MOTRIN) 200 MG tablet Take 800 mg by mouth every 8 (eight) hours as needed (for pain.).    Marland Kitchen LORazepam (ATIVAN) 0.5 MG tablet Take 1 tablet (0.5 mg total) by mouth every 8 (eight) hours as needed for anxiety. 30 tablet 0  . menthol-cetylpyridinium (CEPACOL) 3 MG lozenge Take 1 lozenge (3 mg total) by mouth as needed for sore throat (throat irritation / pain). 100 tablet 12  . montelukast (SINGULAIR) 10 MG tablet Take 10 mg by mouth daily as needed (for allergies.).     Marland Kitchen Multiple Vitamin (MULTIVITAMIN WITH MINERALS) TABS tablet Take 1 tablet by mouth daily.    . Omega-3 Fatty Acids (FISH OIL) 1000 MG CAPS Take 2,000 mg by mouth daily.     . ondansetron (ZOFRAN) 8 MG tablet Take 8 mg by mouth every 8 (eight) hours as needed for nausea or vomiting.    Marland Kitchen oxyCODONE (OXY IR/ROXICODONE) 5 MG immediate release tablet Take 1-2 tablets (5-10 mg total) by mouth  every 6 (six) hours as needed for moderate pain. 30 tablet 0  . senna (SENOKOT) 8.6 MG TABS tablet Take 1 tablet (8.6 mg total) by mouth 2 (two) times daily. 120 each 0  . terbinafine (LAMISIL) 250 MG tablet Take 1 tablet (250 mg total) daily by mouth. 90 tablet 0  . benzonatate (TESSALON) 100 MG capsule Take 100 mg by mouth every 8 (eight) hours as needed for cough.   0  . CRANBERRY PO Take 1 tablet by mouth 4 (four) times daily. 3-4 times daily    . loratadine (CLARITIN) 10 MG tablet Take 1 tablet (10 mg total) by mouth daily. (Patient not taking: Reported on 09/15/2017) 90 tablet 2  . nystatin (MYCOSTATIN) 100000 UNIT/ML suspension Take 5 mLs (500,000 Units total) by mouth 4 (four) times daily. (Patient not taking: Reported on 08/19/2017) 240 mL 0  . ondansetron (ZOFRAN-ODT) 4 MG disintegrating tablet Take 1 tablet (4 mg total)  by mouth every 6 (six) hours as needed for nausea. (Patient not taking: Reported on 09/15/2017) 20 tablet 0  . polyethylene glycol (MIRALAX / GLYCOLAX) packet Take 17 g by mouth daily as needed for mild constipation. (Patient not taking: Reported on 09/15/2017) 14 each 0  . PREVIDENT 5000 BOOSTER PLUS 1.1 % PSTE BRUSH ONCE A DAY IN PLACE OF REGULAR TOOTHPASTE  2  . prochlorperazine (COMPAZINE) 10 MG tablet Take 10 mg by mouth every 6 (six) hours as needed for nausea or vomiting.   1   No current facility-administered medications for this encounter.    Facility-Administered Medications Ordered in Other Encounters  Medication Dose Route Frequency Provider Last Rate Last Dose  . heparin lock flush 100 unit/mL  500 Units Intracatheter Once PRN Magrinat, [name], MD      . sodium chloride flush (NS) 0.9 % injection 10 mL  10 mL Intravenous PRN Magrinat, [name], MD   10 mL at 03/10/17 1239  . sodium chloride flush (NS) 0.9 % injection 10 mL  10 mL Intracatheter PRN Magrinat, [name], MD         REVIEW OF SYSTEMS: On review of systems, the patient reports that she is doing well  overall. She is recovering from some of the effects of chemotherapy, and reports she was fatigued during treatment. She reports some changes in her LUE range of motion. She still has one drain tube in and is planning to see Dr. Marla Roe for continued expansion. She is under the impression this could take several more weeks. She is considering exchanging her expander in the next few weeks for an implant. She denies any chest pain, shortness of breath, cough, fevers, chills, night sweats, unintended weight changes. She denies any bowel or bladder disturbances, and denies abdominal pain, nausea or vomiting. She denies any new musculoskeletal or joint aches or pains. A complete review of systems is obtained and is otherwise negative.     PHYSICAL EXAM:  Wt Readings from Last 3 Encounters:  09/15/17 287 lb 6.4 oz (130.4 kg)  08/20/17 291 lb 14.2 oz (132.4 kg)  07/06/17 296 lb 9.6 oz (134.5 kg)   Temp Readings from Last 3 Encounters:  09/15/17 98.1 F (36.7 C) (Oral)  08/20/17 98.1 F (36.7 C)  07/06/17 97.7 F (36.5 C) (Oral)   BP Readings from Last 3 Encounters:  09/15/17 126/76  08/20/17 (!) 142/85  07/06/17 (!) 145/77   Pulse Readings from Last 3 Encounters:  09/15/17 92  08/20/17 92  07/06/17 99   Pain Assessment Pain Score: 4  Pain Loc: Breast(Left breast)/10  In general this is a well appearing African American female in no acute distress. She is alert and oriented x4 and appropriate throughout the examination. HEENT reveals that the patient is normocephalic, atraumatic. EOMs are intact.  Skin is intact without any evidence of gross lesions.  Cardiopulmonary assessment is negative for acute distress and she exhibits normal effort. The left chest wall is inspected. She has a well healed incision site, and there is an in situ tissue expander. No erythema or separation of her incision site is noted.   ECOG = 1  0 - Asymptomatic (Fully active, able to carry on all predisease  activities without restriction)  1 - Symptomatic but completely ambulatory (Restricted in physically strenuous activity but ambulatory and able to carry out work of a light or sedentary nature. For example, light housework, office work)  2 - Symptomatic, <50% in bed during the day (  Ambulatory and capable of all self care but unable to carry out any work activities. Up and about more than 50% of waking hours)  3 - Symptomatic, >50% in bed, but not bedbound (Capable of only limited self-care, confined to bed or chair 50% or more of waking hours)  4 - Bedbound (Completely disabled. Cannot carry on any self-care. Totally confined to bed or chair)  5 - Death   Eustace Pen MM, Creech RH, Tormey DC, et al. [phone]). "Toxicity and response criteria of the [facility]". [place] Oncol. 5 (6): 649-55    LABORATORY DATA:  Lab Results  Component Value Date   WBC 7.8 08/20/2017   HGB 12.1 08/20/2017   HCT 37.7 08/20/2017   MCV 85.1 08/20/2017   PLT 226 08/20/2017   Lab Results  Component Value Date   NA 136 08/20/2017   K 3.6 08/20/2017   CL 104 08/20/2017   CO2 21 (L) 08/20/2017   Lab Results  Component Value Date   ALT 33 07/06/2017   AST 21 07/06/2017   ALKPHOS 117 07/06/2017   BILITOT <0.2 (L) 07/06/2017      RADIOGRAPHY: No results found.     IMPRESSION/PLAN: 1. Stage IIA cT3N2 ER/PR positive, grade 1-2 invasive ductal carcinoma of the left breast. Dr. Lisbeth Renshaw discusses the surgical pathology findings and reviews the nature of invasive and in situ disease. He recommends a course of adjuvant radiotherapy and would recommend that we wait until she's done with expansion if she plans to keep her expander until completion of radiotherapy, or to proceed with placement of her implant prior to radiotherapy. We discussed the risks, benefits, short, and long term effects of radiotherapy, and the patient is interested in proceeding. Dr. Lisbeth Renshaw discusses the delivery and  logistics of radiotherapy and would offer her a course of 6 1/2 weeks of radiotherapy to the left chest wall with deep inspiration breath hold technique and treatment to the regional lymph nodes. I will contact Dr. Marla Roe, and reach out to the patient next week once she's been seen. Written consent is obtained and placed in the chart, a copy was provided to the patient. We will plan simulation once we know more details regarding her reconstruction plans. 2. ROM limitations of the LUE following surgery for #1. We will refer her back to see PT for education and evaluation.   In a visit lasting 45 minutes, greater than 50% of the time was spent face to face discussing her case, and coordinating the patient's care.  The above documentation reflects my direct findings during this shared patient visit. Please see the separate note by Dr. Lisbeth Renshaw on this date for the remainder of the patient's plan of care.    Carola Rhine, PAC

## 2017-09-16 NOTE — Addendum Note (Signed)
Encounter addended by: Hayden Pedro, PA-C on: 09/16/2017 2:19 PM  Actions taken: Follow-up modified, Problem List reviewed, Sign clinical note, LOS modified

## 2017-09-16 NOTE — Addendum Note (Signed)
Encounter addended by: Hayden Pedro, PA-C on: 09/16/2017 2:20 PM  Actions taken: Sign clinical note

## 2017-09-17 ENCOUNTER — Telehealth: Payer: Self-pay | Admitting: Oncology

## 2017-09-17 ENCOUNTER — Encounter: Payer: Self-pay | Admitting: General Practice

## 2017-09-17 ENCOUNTER — Inpatient Hospital Stay (HOSPITAL_BASED_OUTPATIENT_CLINIC_OR_DEPARTMENT_OTHER): Payer: 59 | Admitting: Oncology

## 2017-09-17 ENCOUNTER — Inpatient Hospital Stay: Payer: 59 | Attending: Adult Health

## 2017-09-17 VITALS — BP 150/82 | HR 93 | Temp 98.2°F | Resp 18 | Ht 67.0 in | Wt 285.8 lb

## 2017-09-17 DIAGNOSIS — C50812 Malignant neoplasm of overlapping sites of left female breast: Secondary | ICD-10-CM | POA: Insufficient documentation

## 2017-09-17 DIAGNOSIS — Z9012 Acquired absence of left breast and nipple: Secondary | ICD-10-CM

## 2017-09-17 DIAGNOSIS — C773 Secondary and unspecified malignant neoplasm of axilla and upper limb lymph nodes: Secondary | ICD-10-CM

## 2017-09-17 DIAGNOSIS — Z5111 Encounter for antineoplastic chemotherapy: Secondary | ICD-10-CM | POA: Diagnosis not present

## 2017-09-17 DIAGNOSIS — Z170 Estrogen receptor positive status [ER+]: Secondary | ICD-10-CM

## 2017-09-17 HISTORY — DX: Morbid (severe) obesity due to excess calories: E66.01

## 2017-09-17 LAB — CBC WITH DIFFERENTIAL/PLATELET
BASOS PCT: 1 %
Basophils Absolute: 0 10*3/uL (ref 0.0–0.1)
EOS PCT: 1 %
Eosinophils Absolute: 0 10*3/uL (ref 0.0–0.5)
HEMATOCRIT: 34.2 % — AB (ref 34.8–46.6)
Hemoglobin: 11.2 g/dL — ABNORMAL LOW (ref 11.6–15.9)
Lymphocytes Relative: 21 %
Lymphs Abs: 1 10*3/uL (ref 0.9–3.3)
MCH: 27.2 pg (ref 25.1–34.0)
MCHC: 32.7 g/dL (ref 31.5–36.0)
MCV: 83.1 fL (ref 79.5–101.0)
MONO ABS: 0.5 10*3/uL (ref 0.1–0.9)
MONOS PCT: 11 %
NEUTROS ABS: 3.1 10*3/uL (ref 1.5–6.5)
Neutrophils Relative %: 66 %
PLATELETS: 249 10*3/uL (ref 145–400)
RBC: 4.11 MIL/uL (ref 3.70–5.45)
RDW: 14.8 % — AB (ref 11.2–14.5)
WBC: 4.7 10*3/uL (ref 3.9–10.3)

## 2017-09-17 LAB — COMPREHENSIVE METABOLIC PANEL
ALBUMIN: 3.5 g/dL (ref 3.5–5.0)
ALK PHOS: 113 U/L (ref 40–150)
ALT: 24 U/L (ref 0–55)
ANION GAP: 6 (ref 3–11)
AST: 18 U/L (ref 5–34)
BILIRUBIN TOTAL: 0.3 mg/dL (ref 0.2–1.2)
BUN: 17 mg/dL (ref 7–26)
CALCIUM: 9.3 mg/dL (ref 8.4–10.4)
CO2: 25 mmol/L (ref 22–29)
Chloride: 108 mmol/L (ref 98–109)
Creatinine, Ser: 0.86 mg/dL (ref 0.60–1.10)
GFR calc Af Amer: >60 mL/min (ref >60)
GFR calc non Af Amer: >60 mL/min (ref >60)
GLUCOSE: 118 mg/dL (ref 70–140)
Potassium: 3.7 mmol/L (ref 3.5–5.1)
SODIUM: 139 mmol/L (ref 136–145)
TOTAL PROTEIN: 6.9 g/dL (ref 6.4–8.3)

## 2017-09-17 MED ORDER — CAPECITABINE 500 MG PO TABS: ORAL_TABLET | ORAL | 1 refills | Status: DC

## 2017-09-17 NOTE — Telephone Encounter (Signed)
Gave patient AVs and calendar of upcoming April through September appointments.

## 2017-09-17 NOTE — Progress Notes (Signed)
[facility] Psychosocial Distress Screening Clinical Social Work  Clinical Social Work was referred by distress screening protocol.  The patient scored a 7 on the Psychosocial Distress Thermometer which indicates moderate distress. Clinical Social Worker Edwyna Shell to assess for distress and other psychosocial needs. Met w patient in clinic, provided information packet and encouraged participation in [facility].  Patient identified difficulty w feeling supported by spouse, spouse is not home much which leaves patient alone w house and childcare responsibilities.  Family has one car, patient has transport needs as well as she cannot get to appointments.  Patient linked w [facility] for support w transportation.  Patient also reports financial difficulties, referred to [facility] for any help they can provide.  Encouraged patient to contact [facility] for support and resources on ongoing basis.    ON[facility]N DISTRESS SCREENING 09/15/2017  Screening Type Initial Screening  Distress experienced in past week (1-10) 7  Practical problem type Housing  Family Problem type Partner  Emotional problem type Nervousness/Anxiety  Information Concerns Type   Physical Problem type Pain;Tingling hands/feet  Physician notified of physical symptoms Yes    Clinical Social Worker follow up needed: Yes.    If yes, follow up plan: Refer for transport and financial help as available.  Edwyna Shell, LCSW Clinical Social Worker Phone:  [phone]

## 2017-09-21 ENCOUNTER — Inpatient Hospital Stay: Payer: 59

## 2017-09-21 DIAGNOSIS — C50812 Malignant neoplasm of overlapping sites of left female breast: Secondary | ICD-10-CM

## 2017-09-21 DIAGNOSIS — Z95828 Presence of other vascular implants and grafts: Secondary | ICD-10-CM

## 2017-09-21 DIAGNOSIS — Z170 Estrogen receptor positive status [ER+]: Principal | ICD-10-CM

## 2017-09-21 DIAGNOSIS — Z5111 Encounter for antineoplastic chemotherapy: Secondary | ICD-10-CM | POA: Diagnosis not present

## 2017-09-21 DIAGNOSIS — C773 Secondary and unspecified malignant neoplasm of axilla and upper limb lymph nodes: Secondary | ICD-10-CM | POA: Diagnosis not present

## 2017-09-21 DIAGNOSIS — Z9012 Acquired absence of left breast and nipple: Secondary | ICD-10-CM | POA: Diagnosis not present

## 2017-09-21 LAB — CBC WITH DIFFERENTIAL/PLATELET
Basophils Absolute: 0 10*3/uL (ref 0.0–0.1)
Basophils Relative: 0 %
EOS PCT: 2 %
Eosinophils Absolute: 0.1 10*3/uL (ref 0.0–0.5)
HEMATOCRIT: 37 % (ref 34.8–46.6)
Hemoglobin: 11.7 g/dL (ref 11.6–15.9)
LYMPHS ABS: 1.4 10*3/uL (ref 0.9–3.3)
LYMPHS PCT: 26 %
MCH: 26.8 pg (ref 25.1–34.0)
MCHC: 31.6 g/dL (ref 31.5–36.0)
MCV: 84.7 fL (ref 79.5–101.0)
Monocytes Absolute: 0.6 10*3/uL (ref 0.1–0.9)
Monocytes Relative: 11 %
NEUTROS ABS: 3.2 10*3/uL (ref 1.5–6.5)
Neutrophils Relative %: 61 %
PLATELETS: 266 10*3/uL (ref 145–400)
RBC: 4.37 MIL/uL (ref 3.70–5.45)
RDW: 14.5 % (ref 11.2–14.5)
WBC: 5.2 10*3/uL (ref 3.9–10.3)

## 2017-09-21 LAB — COMPREHENSIVE METABOLIC PANEL
ALBUMIN: 3.7 g/dL (ref 3.5–5.0)
ALT: 28 U/L (ref 0–55)
ANION GAP: 6 (ref 3–11)
AST: 21 U/L (ref 5–34)
Alkaline Phosphatase: 122 U/L (ref 40–150)
BUN: 15 mg/dL (ref 7–26)
CHLORIDE: 104 mmol/L (ref 98–109)
CO2: 29 mmol/L (ref 22–29)
Calcium: 9.4 mg/dL (ref 8.4–10.4)
Creatinine, Ser: 1.04 mg/dL (ref 0.60–1.10)
GFR calc Af Amer: >60 mL/min (ref >60)
GLUCOSE: 86 mg/dL (ref 70–140)
POTASSIUM: 3.9 mmol/L (ref 3.5–5.1)
Sodium: 139 mmol/L (ref 136–145)
Total Bilirubin: 0.2 mg/dL (ref 0.2–1.2)
Total Protein: 7.2 g/dL (ref 6.4–8.3)

## 2017-09-21 MED ORDER — GOSERELIN ACETATE 3.6 MG ~~LOC~~ IMPL
3.6000 mg | DRUG_IMPLANT | Freq: Once | SUBCUTANEOUS | Status: AC
  Administered 2017-09-21: 3.6 mg via SUBCUTANEOUS

## 2017-09-21 MED FILL — CEPHALEXIN 500 MG CAPSULE: 500 | 5 days supply | Qty: 20 | Fill #0

## 2017-09-21 NOTE — Patient Instructions (Signed)
Goserelin injection What is this medicine? GOSERELIN (GOE se rel in) is similar to a hormone found in the body. It lowers the amount of sex hormones that the body makes. Men will have lower testosterone levels and women will have lower estrogen levels while taking this medicine. In men, this medicine is used to treat prostate cancer; the injection is either given once per month or once every 12 weeks. A once per month injection (only) is used to treat women with endometriosis, dysfunctional uterine bleeding, or advanced breast cancer. This medicine may be used for other purposes; ask your health care provider or pharmacist if you have questions. COMMON BRAND NAME(S): Zoladex What should I tell my health care provider before I take this medicine? They need to know if you have any of these conditions (some only apply to women): -diabetes -heart disease or previous heart attack -high blood pressure -high cholesterol -kidney disease -osteoporosis or low bone density -problems passing urine -spinal cord injury -stroke -tobacco smoker -an unusual or allergic reaction to goserelin, hormone therapy, other medicines, foods, dyes, or preservatives -pregnant or trying to get pregnant -breast-feeding How should I use this medicine? This medicine is for injection under the skin. It is given by a health care professional in a hospital or clinic setting. Men receive this injection once every 4 weeks or once every 12 weeks. Women will only receive the once every 4 weeks injection. Talk to your pediatrician regarding the use of this medicine in children. Special care may be needed. Overdosage: If you think you have taken too much of this medicine contact a poison control center or emergency room at once. NOTE: This medicine is only for you. Do not share this medicine with others. What if I miss a dose? It is important not to miss your dose. Call your doctor or health care professional if you are unable to  keep an appointment. What may interact with this medicine? -female hormones like estrogen -herbal or dietary supplements like black cohosh, chasteberry, or DHEA -female hormones like testosterone -prasterone This list may not describe all possible interactions. Give your health care provider a list of all the medicines, herbs, non-prescription drugs, or dietary supplements you use. Also tell them if you smoke, drink alcohol, or use illegal drugs. Some items may interact with your medicine. What should I watch for while using this medicine? Visit your doctor or health care professional for regular checks on your progress. Your symptoms may appear to get worse during the first weeks of this therapy. Tell your doctor or healthcare professional if your symptoms do not start to get better or if they get worse after this time. Your bones may get weaker if you take this medicine for a long time. If you smoke or frequently drink alcohol you may increase your risk of bone loss. A family history of osteoporosis, chronic use of drugs for seizures (convulsions), or corticosteroids can also increase your risk of bone loss. Talk to your doctor about how to keep your bones strong. This medicine should stop regular monthly menstration in women. Tell your doctor if you continue to menstrate. Women should not become pregnant while taking this medicine or for 12 weeks after stopping this medicine. Women should inform their doctor if they wish to become pregnant or think they might be pregnant. There is a potential for serious side effects to an unborn child. Talk to your health care professional or pharmacist for more information. Do not breast-feed an infant while taking   this medicine. Men should inform their doctors if they wish to father a child. This medicine may lower sperm counts. Talk to your health care professional or pharmacist for more information. What side effects may I notice from receiving this  medicine? Side effects that you should report to your doctor or health care professional as soon as possible: -allergic reactions like skin rash, itching or hives, swelling of the face, lips, or tongue -bone pain -breathing problems -changes in vision -chest pain -feeling faint or lightheaded, falls -fever, chills -pain, swelling, warmth in the leg -pain, tingling, numbness in the hands or feet -signs and symptoms of low blood pressure like dizziness; feeling faint or lightheaded, falls; unusually weak or tired -stomach pain -swelling of the ankles, feet, hands -trouble passing urine or change in the amount of urine -unusually high or low blood pressure -unusually weak or tired Side effects that usually do not require medical attention (report to your doctor or health care professional if they continue or are bothersome): -change in sex drive or performance -changes in breast size in both males and females -changes in emotions or moods -headache -hot flashes -irritation at site where injected -loss of appetite -skin problems like acne, dry skin -vaginal dryness This list may not describe all possible side effects. Call your doctor for medical advice about side effects. You may report side effects to FDA at 1-800-FDA-1088. Where should I keep my medicine? This drug is given in a hospital or clinic and will not be stored at home. NOTE: This sheet is a summary. It may not cover all possible information. If you have questions about this medicine, talk to your doctor, pharmacist, or health care provider.  2018 Elsevier/Gold Standard (2013-08-02 11:10:35)  

## 2017-09-28 ENCOUNTER — Telehealth: Payer: Self-pay | Admitting: *Deleted

## 2017-09-28 NOTE — Telephone Encounter (Signed)
Received faxed request from [facility] for xeloda - stating need for prior authorization.  Noted per MD dictation pt is to take with radiation therapy.  This note will be sent to oral chemo pharmacist for appropriate follow up.

## 2017-09-29 ENCOUNTER — Telehealth: Payer: Self-pay | Admitting: Pharmacy Technician

## 2017-09-29 ENCOUNTER — Telehealth: Payer: Self-pay | Admitting: *Deleted

## 2017-09-29 ENCOUNTER — Telehealth: Payer: Self-pay | Admitting: Pharmacist

## 2017-09-29 DIAGNOSIS — C50812 Malignant neoplasm of overlapping sites of left female breast: Secondary | ICD-10-CM

## 2017-09-29 DIAGNOSIS — Z170 Estrogen receptor positive status [ER+]: Principal | ICD-10-CM

## 2017-09-29 MED ORDER — CAPECITABINE 500 MG PO TABS: ORAL_TABLET | ORAL | 0 refills | Status: DC

## 2017-09-29 NOTE — Telephone Encounter (Signed)
Oral Oncology Patient Advocate Encounter  Received notification from [employer] that prior authorization for Xeloda is required.  Prior Authorization clinical questions answered over the phone on 09/28/2017.  Status is pending.  Noted that original prescription was sent electronically to [facility] on 09-17-2017.    Oral Oncology Clinic will continue to follow.  Fabio Asa. Melynda Keller, [place] Patient [place] [phone] 09/29/2017 8:12 AM

## 2017-09-29 NOTE — Telephone Encounter (Addendum)
2:00 pm, 2:19 pm. Called the patient in regards to setting up her CT simulation.  I was unable to leave a voicemail.  Will follow as necessary.  Cori Razor, RN   2:33 pm.  Called her husband [name] and left a voicemail for her to give me a call.  Return number was left.  Will continue to follow as necessary.  Cori Razor, RN

## 2017-09-29 NOTE — Telephone Encounter (Signed)
Oral Oncology Pharmacist Encounter  Received new prescription for Xeloda (capecitabine) for the adjuvant treatment of stage II a hormone receptor positive breast cancer in conjunction with radiation, planned duration 6-1/2 weeks.  Prescription originally sent to [facility] outpatient pharmacy on 09/17/2017. Oral oncology clinic at [facility] was alerted to prescription on 09/28/2017.  Patient is status post neoadjuvant ddAC x 4 cycles, followed by weekly carboplatin + gemcitabine. Patient is status post left modified radical mastectomy on 08/27/2017 Patient noted with residual disease post surgery and capecitabine will be added to radiation course for sensitization. Patient is also being initiated on ovarian suppression plus an aromatase inhibitor.  Radiation start date is not yet scheduled.  Labs from 09/17/2017 assessed, okay for treatment.  Current medication list in Epic reviewed, no significant DDIs with Xeloda identified.  Prescription has been e-scribed to the [facility] for benefits analysis and approval.  Oral Oncology Clinic will continue to follow for insurance authorization, copayment issues, initial counseling and start date.  Johny Drilling, PharmD, BCPS, BCOP 09/29/2017 9:02 AM Oral Oncology Clinic [phone]

## 2017-09-30 NOTE — Telephone Encounter (Signed)
Oral Chemotherapy Pharmacist Encounter   Attempted to reach patient to provide update and offer for initial counseling on oral medication: Xeloda.   No answer. Unable to leave message.  I called and spoke with patient's husband, [name]. He will have patient call me for Xeloda update.  Thank you,  Johny Drilling, PharmD, BCPS, BCOP 09/30/2017   11:44 AM Oral Oncology Clinic [phone]

## 2017-10-05 ENCOUNTER — Other Ambulatory Visit: Payer: Self-pay | Admitting: Oncology

## 2017-10-05 NOTE — Progress Notes (Signed)
Several attempts have been made to schedule Mackenzie [name] for simulation.  So far they have not been able to contact her or her husband.  I have alerted our social worker and our radiation oncologist regarding this.

## 2017-10-06 ENCOUNTER — Ambulatory Visit: Payer: 59 | Attending: Radiation Oncology | Admitting: Physical Therapy

## 2017-10-06 ENCOUNTER — Other Ambulatory Visit: Payer: Self-pay

## 2017-10-06 DIAGNOSIS — M25612 Stiffness of left shoulder, not elsewhere classified: Secondary | ICD-10-CM | POA: Diagnosis not present

## 2017-10-06 DIAGNOSIS — Z483 Aftercare following surgery for neoplasm: Secondary | ICD-10-CM | POA: Diagnosis not present

## 2017-10-06 DIAGNOSIS — Z9189 Other specified personal risk factors, not elsewhere classified: Secondary | ICD-10-CM | POA: Insufficient documentation

## 2017-10-06 MED FILL — CAPECITABINE 500 MG TABLET: 500 | 21 days supply | Qty: 84 | Fill #0

## 2017-10-06 NOTE — Therapy (Signed)
[place], [place], [address] Phone: [phone]   Fax:  [phone]  Physical Therapy Evaluation  Patient Details  Name: Mackenzie [name] MRN: [id number] Date of Birth: 11-Nov-1970 Referring Provider: Shona Simpson, PA-C   Encounter Date: 10/06/2017  PT End of Session - 10/06/17 1711    Visit Number  1    Number of Visits  9    Date for PT Re-Evaluation  11/20/17    PT Start Time  1093    PT Stop Time  1557    PT Time Calculation (min)  46 min    Activity Tolerance  Patient tolerated treatment well    Behavior During Therapy  [facility] for tasks assessed/performed       Past Medical History:  Diagnosis Date  . Anemia   . Breast cancer ([place]) 01/14/2017   Left breast  . Diabetes mellitus    GDM    Past Surgical History:  Procedure Laterality Date  . BREAST RECONSTRUCTION WITH PLACEMENT OF TISSUE EXPANDER AND FLEX HD (ACELLULAR HYDRATED DERMIS) Left 08/27/2017   Procedure: LEFT BREAST RECONSTRUCTION WITH PLACEMENT OF TISSUE EXPANDER AND FLEX HD;  Surgeon: Wallace Going, DO;  Location: [place];  Service: Plastics;  Laterality: Left;  . CESAREAN SECTION     x2  . MASTECTOMY MODIFIED RADICAL Left 08/27/2017  . MASTECTOMY MODIFIED RADICAL Left 08/27/2017   Procedure: LEFT MODIFIED RADICAL MASTECTOMY;  Surgeon: Erroll Luna, MD;  Location: [place];  Service: General;  Laterality: Left;  . PORT-A-CATH REMOVAL Right 08/27/2017   Procedure: REMOVAL PORT-A-CATH RIGHT CHEST;  Surgeon: Erroll Luna, MD;  Location: [place];  Service: General;  Laterality: Right;  . PORTACATH PLACEMENT Right 01/28/2017   Procedure: INSERTION PORT-A-CATH WITH ULTRASOUND;  Surgeon: Erroll Luna, MD;  Location: [place];  Service: General;  Laterality: Right;  . TUBAL LIGATION      There were no vitals filed for this visit.   Subjective Assessment - 10/06/17 1514    Subjective  Here to get the strength  back in this (left) arm and be able to do more range of motion, and hopefully to prevent the swelling. I just found out I'm going to have to have another surgery.    Patient is accompained by:  -- friend, [place]    Pertinent History  Left breast cancer diagnosed 01/10/17 by mammogram and needle biopsy. Then had neo-adjuvant chemo starting 02/10/17 until 06/26/17; had left mastectomy 08/27/17 with ALND and immediate expander placed. Has had 6 fills. Still has her drain and Dr. Marla Roe is monitoring that. Will have radiation for 33 visits but it's not scheduled yet. Will have lat flap surgery after radiation. Allergies seasonal and all the time.    Patient Stated Goals  get ROM, strength back; avoid lymphedema    Currently in Pain?  Yes    Pain Score  4     Pain Location  Breast    Pain Orientation  Left    Pain Descriptors / Indicators  Tightness    Pain Type  Surgical pain    Aggravating Factors   fill and drain    Pain Relieving Factors  time and ibuprofen or tylenol or oxycodone and valium         [facility] PT Assessment - 10/06/17 0001      Assessment   Medical Diagnosis  left breast cancer with mastectomy and ALND    Referring Provider  Shona Simpson, PA-C  Onset Date/Surgical Date  08/27/17    Hand Dominance  Right    Prior Therapy  none      Precautions   Precaution Comments  no lifting over 10 lbs. with left arm      Restrictions   Weight Bearing Restrictions  No      Balance Screen   Has the patient fallen in the past 6 months  No    Has the patient had a decrease in activity level because of a fear of falling?   No    Is the patient reluctant to leave their home because of a fear of falling?   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    Leisure  Has been walking 2-3 miles/day, 3-4 days/week      Cognition   Overall Cognitive Status  Within Functional Limits for tasks assessed      Observation/Other Assessments   Skin Integrity  incision with glue in place, mostly healed; one drain at lower outer left breast      Posture/Postural Control   Posture/Postural Control  Postural limitations    Postural Limitations  Forward head;Rounded Shoulders      ROM / Strength   AROM / PROM / Strength  AROM      AROM   AROM Assessment Site  Shoulder    Right/Left Shoulder  Right;Left    Right Shoulder Flexion  130 Degrees all measurements in sitting    Right Shoulder ABduction  140 Degrees    Right Shoulder Internal Rotation  50 Degrees    Right Shoulder External Rotation  85 Degrees    Left Shoulder Flexion  115 Degrees    Left Shoulder ABduction  81 Degrees    Left Shoulder Internal Rotation  65 Degrees    Left Shoulder External Rotation  80 Degrees      Palpation   Palpation comment  limited soft tissue mobility around scar      Ambulation/Gait   Ambulation/Gait  Yes    Ambulation/Gait Assistance  7: Independent      Functional Gait  Assessment   Gait assessed   Yes        LYMPHEDEMA/ONCOLOGY QUESTIONNAIRE - 10/06/17 1537      Type   Cancer Type  left breast      Surgeries   Mastectomy Date  08/27/17    Number Lymph Nodes Removed  -- ALND      Treatment   Past Chemotherapy Treatment  Yes    Active Radiation Treatment  No to start soon      Lymphedema Assessments   Lymphedema Assessments  Upper extremities      Right Upper Extremity Lymphedema   10 cm Proximal to Olecranon Process  40.5 cm    Olecranon Process  31.9 cm    10 cm Proximal to Ulnar Styloid Process  28 cm    Just Proximal to Ulnar Styloid Process  20.9 cm    Across Hand at [employer]  22.5 cm    At  Base of 2nd Digit  6.9 cm      Left  Upper Extremity Lymphedema   10 cm Proximal to Olecranon Process  42.3 cm    Olecranon Process  33 cm    10 cm Proximal to Ulnar Styloid Process  29 cm    Just Proximal to Ulnar Styloid Process  21 cm    Across Hand at [employer]  21.4 cm    At [place] of 2nd Digit  6.8 cm             Objective measurements completed on examination: See above findings.              PT Education - 10/06/17 1710    Education provided  Yes    Education Details  standing dowel shoulder ROM, to be done only to 90 degrees until 7 days after drain is removed    Person(s) Educated  Patient    Methods  Explanation;Tactile cues;Verbal cues;Handout    Comprehension  Verbalized understanding;Returned demonstration          PT Long Term Goals - 10/06/17 1720      PT LONG TERM GOAL #1   Title  Pt. will be independent in HEP for shoulder ROM    Time  4    Period  Weeks      PT LONG TERM GOAL #2   Title  Pt. will be able to get left arm into position for radiation treatment.    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 She still has a drain in place.  Left shoulder AROM is limited, but patient was encouraged to keep shoulder flexion and abduction to 90 degrees only until a week after drain is removed, which MAY be this Friday or next Tuesday. She is at risk for lymphedema; she does show greater upper arm circumference on left than right  today, but this could be post-surgical swelling.     History and Personal Factors relevant to plan of care:  still with drain in place, 10 lb. lifting restriction    Clinical Presentation  Evolving    Clinical Presentation due to:  recovering from mastectomy, will start radiation soon    Clinical Decision Making  Moderate    Rehab Potential  Good    Clinical Impairments Affecting Rehab Potential  currently has drain in place    PT Frequency  2x / week she will start the week of May 13th, approx. a week after drain may be removed    PT Duration  4 weeks    PT Treatment/Interventions  ADLs/Self  Care Home Management;Therapeutic exercise;Patient/family education;Manual techniques;Manual lymph drainage;Compression bandaging;Scar mobilization;Passive range of motion    PT Next Visit Plan  Check when drain was removed. Recheck AROM. Check HEP given today.  Begin P/AA/A/ROM to left shoulder.    PT Home Exercise Plan  standing dowel exercises with flexion and abduction to be done to 90 degrees only until a week after drain is removed    Consulted and Agree with Plan of Care  Patient       Patient will benefit from skilled therapeutic intervention in order to improve the following deficits and impairments:  Decreased range of motion, Impaired UE functional use, Decreased strength, Other (comment)(at risk for lymphedema)  Visit Diagnosis: Stiffness of left shoulder, not elsewhere classified - Plan: PT plan of care cert/re-cert  At risk for lymphedema - Plan: PT plan of care cert/re-cert  Aftercare following surgery for neoplasm - Plan: PT plan of care cert/re-cert     Problem List Patient Active Problem List   Diagnosis Date Noted  . Morbid obesity with body mass index (BMI) of 40.0 to 49.9 ([place]) 09/17/2017  . Breast cancer ([place]) 08/27/2017  . Genetic testing 03/25/2017  . Port catheter in place 02/10/2017  . Malignant 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, estrogen receptor  positive ([place]) 01/23/2017  . 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

## 2017-10-06 NOTE — Patient Instructions (Addendum)
UNTIL ONE WEEK AFTER DRAIN IS REMOVED, MOVE YOUR ARM TO SHOULDER HEIGHT (21 DEGREES) ONLY-- DON'T PUSH YET.   Flexion (Eccentric) - Active-Assist (Cane)          Cancer Rehab [phone]    Use unaffected arm to push affected arm forward. Avoid hiking shoulder (shoulder should NOT touch cheek). Keep palm relaxed. Slowly lower affected arm. Hold stretch for _5_ seconds repeating _5-10_ times, _1-2_ times a day.  Abduction (Eccentric) - Active-Assist (Cane)    Use unaffected arm to push affected arm out to side. Avoid hiking shoulder (shoulder should NOT touch cheek). Keep palm relaxed. Slowly lower affected arm. Hold stretch _5_ seconds repeating _5-10_ times, _1-2_ times a day.  Cane Exercise: Extension   Stand holding cane behind back with both hands palm-up. Lift the cane away from body until gentle stretch felt. Do NOT lean forward.  Hold __5__ seconds. Repeat _5-10___ times. Do _1-2___ sessions per day.  CHEST: Doorway, Bilateral - Standing    Standing in doorway, place hands on wall with elbows bent at shoulder height and place one foot in front of other. Shift weight onto front foot. Hold _10-20__ seconds. Do _3-5_ times, _1-2_ times a day.

## 2017-10-14 ENCOUNTER — Telehealth: Payer: Self-pay | Admitting: *Deleted

## 2017-10-14 NOTE — Telephone Encounter (Signed)
Returned the patients phone call.  I informed her that I would pass the information on to [place].  I also let her know that our scheduler would be in contact with her in regards to [place].  Will continue to follow as necessary.  Cori Razor, RN

## 2017-10-18 NOTE — Progress Notes (Signed)
Mackenzie [name]  Telephone:([phone] Fax:([phone]     ID: Mackenzie [name] DOB: 1971-01-19  MR#: [id number]  BDZ#:[id number]  Patient Care Team: Willey Blade, MD as PCP - General (Internal Medicine) Matisyn Cabeza, [name], MD as Consulting Physician (Oncology) Erroll Luna, MD as Consulting Physician (General Surgery) Kyung Rudd, MD as Consulting Physician (Radiation Oncology) Dillingham, Loel Lofty, DO as Attending Physician (Plastic Surgery) OTHER MD:  CHIEF COMPLAINT: Estrogen receptor positive breast cancer  CURRENT TREATMENT: Adjuvant radiation pending   HISTORY OF CURRENT ILLNESS: From the original intake note:  The patient herself noted some changes in her left breast late July 2018, she says, and eventually brought this to medical attention so that on 01/09/2017 she underwent bilateral diagnostic mammography with tomography and left breast ultrasonography at the breast Center. This found the breast density to be category C. In the upper inner quadrant of the left breast there was an area of asymmetry and there were malignant type calcifications involving all 4 quadrants. On exam there is firmness and palpable thickening in the anterior left breast with skin dimpling. Ultrasonography found at the 9:30 o'clock radiant 3 cm from the nipple a 2.7 cm mass and in the left axilla for abnormal lymph nodes largest of which measured 2.7 cm.  Biopsy of the left breast 9:30 o'clock mass 80 01/26/2017 showed ([id number]) invasive ductal carcinoma, with extracellular mucin, grade 1 or 2. In the lower outer left breast is separated biopsy the same day showed ductal carcinoma in situ. One of the 4 lymph nodes involved was positive for metastatic carcinoma. Prognostic panel on the invasive disease showed it to be estrogen receptor 100% positive, progesterone receptor 20% positive, both with strong staining intensity, with an MIB-1 of 20%, and no HER-2 amplification with a  signals ratio 1.38 and the number per cell 2.90.  On 01/22/2017 the patient underwent bilateral breast MRI. This showed no involvement of the right breast, but in the left breast there was a masslike and non-masslike enhancement involving all quadrants, with skin swelling but no abnormal enhancement of the skin or nipple areolar complex or pectoralis muscle. The mass could not be clearly measured but spanned approximately 12 cm. There was bulky left axillary lymphadenopathy, with the largest lymph node measuring up to 3.4 cm.  The patient's subsequent history is as detailed below.  INTERVAL HISTORY: Mackenzie [name] returns today for evaluation and treatment of her estrogen receptor positive breast cancer. She is accompanied by two women family members.      REVIEW OF SYSTEMS: Mackenzie [name] still has her drain in place and reports it is still draining 50-60 cc a day. She is currently still waiting for her CT simulation appointment, so she will be able to start radiation. The day before her CT sim her drain will be removed. She denies unusual headaches, visual changes, nausea, vomiting, or dizziness. There has been no unusual cough, phlegm production, or pleurisy. This been no change in bowel or bladder habits. She denies unexplained fatigue or unexplained weight loss, bleeding, rash, or fever. A detailed review of systems was otherwise noncontributory.    PAST MEDICAL HISTORY: Past Medical History:  Diagnosis Date  . Anemia   . Breast cancer ([place]) 01/14/2017   Left breast  . Diabetes mellitus    GDM    PAST SURGICAL HISTORY: Past Surgical History:  Procedure Laterality Date  . BREAST RECONSTRUCTION WITH PLACEMENT OF TISSUE EXPANDER AND FLEX HD (ACELLULAR HYDRATED DERMIS) Left 08/27/2017   Procedure: LEFT BREAST  RECONSTRUCTION WITH PLACEMENT OF TISSUE EXPANDER AND FLEX HD;  Surgeon: Wallace Going, DO;  Location: [facility];  Service: Plastics;  Laterality: Left;  . CESAREAN SECTION     x2  .  MASTECTOMY MODIFIED RADICAL Left 08/27/2017  . MASTECTOMY MODIFIED RADICAL Left 08/27/2017   Procedure: LEFT MODIFIED RADICAL MASTECTOMY;  Surgeon: Erroll Luna, MD;  Location: [facility];  Service: General;  Laterality: Left;  . PORT-A-CATH REMOVAL Right 08/27/2017   Procedure: REMOVAL PORT-A-CATH RIGHT CHEST;  Surgeon: Erroll Luna, MD;  Location: [facility];  Service: General;  Laterality: Right;  . PORTACATH PLACEMENT Right 01/28/2017   Procedure: INSERTION PORT-A-CATH WITH ULTRASOUND;  Surgeon: Erroll Luna, MD;  Location: [facility];  Service: General;  Laterality: Right;  . TUBAL LIGATION      FAMILY HISTORY Family History  Problem Relation Age of Onset  . Breast cancer Paternal Grandmother 21       d.60s from breast cancer. Did not have treatment.  . Other Mother        U.13 from complications of surgery to remove brain tumor  The patient's father still alive at age 70. The patient's mother died with a brain tumor which the patient says was "benign". She was 56. The patient has one brother, no sisters. The only breast cancer in the family is a paternal grandmother who died from breast cancer at an unknown age. There is no history of ovarian or prostate cancer in the family.  GYNECOLOGIC HISTORY:  No LMP recorded. (Menstrual status: Chemotherapy). Menarche age 26 and first live birth age 65 she is [place] P4. The person is still having regular periods as of August 2018  SOCIAL HISTORY: (As of August 2018) Aracely works at [employer] on the fifth floor, [place]. Her husband, Mackenzie [name], is a Building control surveyor. Daughter is married and lives in [place] and works as a Education administrator. Daughter, Mackenzie [name] lives in [place] and works in the police department. Daughters, [place] and [place] are 57 and 6, living at home. The patient has no grandchildren. She attends a Tour manager   ADVANCED DIRECTIVES: Not in place   HEALTH MAINTENANCE: Social History   Tobacco Use  .  Smoking status: Never Smoker  . Smokeless tobacco: Never Used  Substance Use Topics  . Alcohol use: Yes    Comment: social  . Drug use: No     Colonoscopy: Never  PAP: December 2017  Bone density: Never   No Known Allergies  Current Outpatient Medications  Medication Sig Dispense Refill  . acetaminophen (TYLENOL) 500 MG tablet Take 1,000 mg by mouth every 6 (six) hours as needed (for pain/headaches.).    Marland Kitchen B Complex Vitamins (VITAMIN B COMPLEX PO) Take 1 capsule by mouth 2 (two) times daily.     . benzonatate (TESSALON) 100 MG capsule Take 100 mg by mouth every 8 (eight) hours as needed for cough.   0  . Calcium Carb-Cholecalciferol (CALCIUM 600+D3 PO) Take 1 tablet by mouth 2 (two) times daily.    . capecitabine (XELODA) 500 MG tablet Take 2 tablets ('1000mg'$ ) by mouth twice daily, within 30 minutes after meals. Take on days of radiation only, M-F (Patient not taking: Reported on 10/06/2017) 140 tablet 0  . cetirizine (ZYRTEC) 10 MG tablet Take 10 mg by mouth daily.      . Cholecalciferol (VITAMIN D-3) 5000 units TABS Take 5,000 Units by mouth 2 (two) times daily.    Marland Kitchen CRANBERRY PO Take 1 tablet by mouth 4 (four)  times daily. 3-4 times daily    . diazepam (VALIUM) 2 MG tablet Take 1 tablet (2 mg total) by mouth every 12 (twelve) hours as needed for muscle spasms. 30 tablet 0  . ibuprofen (ADVIL,MOTRIN) 200 MG tablet Take 800 mg by mouth every 8 (eight) hours as needed (for pain.).    Marland Kitchen loratadine (CLARITIN) 10 MG tablet Take 1 tablet (10 mg total) by mouth daily. 90 tablet 2  . LORazepam (ATIVAN) 0.5 MG tablet Take 1 tablet (0.5 mg total) by mouth every 8 (eight) hours as needed for anxiety. 30 tablet 0  . menthol-cetylpyridinium (CEPACOL) 3 MG lozenge Take 1 lozenge (3 mg total) by mouth as needed for sore throat (throat irritation / pain). (Patient not taking: Reported on 10/06/2017) 100 tablet 12  . montelukast (SINGULAIR) 10 MG tablet Take 10 mg by mouth daily as needed (for  allergies.).     Marland Kitchen Multiple Vitamin (MULTIVITAMIN WITH MINERALS) TABS tablet Take 1 tablet by mouth daily.    Marland Kitchen nystatin (MYCOSTATIN) 100000 UNIT/ML suspension Take 5 mLs (500,000 Units total) by mouth 4 (four) times daily. (Patient not taking: Reported on 08/19/2017) 240 mL 0  . Omega-3 Fatty Acids (FISH OIL) 1000 MG CAPS Take 2,000 mg by mouth daily.     . ondansetron (ZOFRAN) 8 MG tablet Take 8 mg by mouth every 8 (eight) hours as needed for nausea or vomiting.    . ondansetron (ZOFRAN-ODT) 4 MG disintegrating tablet Take 1 tablet (4 mg total) by mouth every 6 (six) hours as needed for nausea. (Patient not taking: Reported on 09/15/2017) 20 tablet 0  . oxyCODONE (OXY IR/ROXICODONE) 5 MG immediate release tablet Take 1-2 tablets (5-10 mg total) by mouth every 6 (six) hours as needed for moderate pain. 30 tablet 0  . polyethylene glycol (MIRALAX / GLYCOLAX) packet Take 17 g by mouth daily as needed for mild constipation. (Patient not taking: Reported on 09/15/2017) 14 each 0  . PREVIDENT 5000 BOOSTER PLUS 1.1 % PSTE BRUSH ONCE A DAY IN PLACE OF REGULAR TOOTHPASTE  2  . prochlorperazine (COMPAZINE) 10 MG tablet Take 10 mg by mouth every 6 (six) hours as needed for nausea or vomiting.   1  . senna (SENOKOT) 8.6 MG TABS tablet Take 1 tablet (8.6 mg total) by mouth 2 (two) times daily. 120 each 0  . terbinafine (LAMISIL) 250 MG tablet Take 1 tablet (250 mg total) daily by mouth. (Patient not taking: Reported on 10/06/2017) 90 tablet 0   No current facility-administered medications for this visit.    Facility-Administered Medications Ordered in Other Visits  Medication Dose Route Frequency Provider Last Rate Last Dose  . sodium chloride flush (NS) 0.9 % injection 10 mL  10 mL Intravenous PRN Lydia Toren, [name], MD   10 mL at 03/10/17 1239    OBJECTIVE: Middle-aged African-American woman in no acute distress  Vitals:   10/19/17 1334 10/19/17 1348  BP:  (!) 157/82  Pulse:  82  Resp: 18 18  Temp:  98.5  F (36.9 C)  SpO2:  100%     Body mass index is 43.54 kg/m.   Wt Readings from Last 3 Encounters:  10/19/17 278 lb (126.1 kg)  09/17/17 285 lb 12.8 oz (129.6 kg)  09/15/17 287 lb 6.4 oz (130.4 kg)   ECOG FS: 1 - Symptomatic but completely ambulatory   Sclerae unicteric, EOMs intact Oropharynx clear and moist No cervical or supraclavicular adenopathy Lungs no rales or rhonchi Heart regular rate and  rhythm Abd soft, nontender, positive bowel sounds MSK no focal spinal tenderness, no upper extremity lymphedema Neuro: nonfocal, well oriented, appropriate affect Breasts: Right breast is unremarkable.  The left breast is status post mastectomy with expander in place, and a drain in place, with approximately 40 cc of serosanguineous fluid in the bulb.  There is no evidence of local recurrence.  Both axillae are benign.  LAB RESULTS:    Appointment on 10/19/2017  Component Date Value Ref Range Status  . WBC 10/19/2017 5.5  3.9 - 10.3 K/uL Final  . RBC 10/19/2017 4.45  3.70 - 5.45 MIL/uL Final  . Hemoglobin 10/19/2017 11.9  11.6 - 15.9 g/dL Final  . HCT 10/19/2017 36.1  34.8 - 46.6 % Final  . MCV 10/19/2017 81.0  79.5 - 101.0 fL Final  . MCH 10/19/2017 26.7  25.1 - 34.0 pg Final  . MCHC 10/19/2017 33.0  31.5 - 36.0 g/dL Final  . RDW 10/19/2017 15.0* 11.2 - 14.5 % Final  . Platelets 10/19/2017 268  145 - 400 K/uL Final  . Neutrophils Relative % 10/19/2017 64  % Final  . Neutro Abs 10/19/2017 3.6  1.5 - 6.5 K/uL Final  . Lymphocytes Relative 10/19/2017 25  % Final  . Lymphs Abs 10/19/2017 1.4  0.9 - 3.3 K/uL Final  . Monocytes Relative 10/19/2017 9  % Final  . Monocytes Absolute 10/19/2017 0.5  0.1 - 0.9 K/uL Final  . Eosinophils Relative 10/19/2017 1  % Final  . Eosinophils Absolute 10/19/2017 0.0  0.0 - 0.5 K/uL Final  . Basophils Relative 10/19/2017 1  % Final  . Basophils Absolute 10/19/2017 0.0  0.0 - 0.1 K/uL Final   Performed at [facility], [place] [address] [place], [place] [address]       STUDIES: No results found.   ELIGIBLE FOR AVAILABLE RESEARCH PROTOCOL: no  ASSESSMENT: 47 y.o. [place] woman status post left breast upper inner quadrant biopsy 01/14/2017 for a clinical T3 N2, stage IIA invasive ductal carcinoma, grade 1 or 2, estrogen and progesterone receptor positive, HER-2 nonamplified, with an MIB-1 of 20%.  (1) staging studies: Brain MRI, bone scan, and CT scan of the chest 02/05/2017 showed no brain lesions, no lung or liver lesions, a 4.9 cm mass in the left breast with left axillary and subpectoral adenopathy, and nonspecific bone scan tracer at L2, left scapula, and anterior ribs, with lumbar spine MRI suggested for further evaluation.  (a) lumbar spine MRI 02/17/2017 showed no normal bone lesions.  There was mild lumbar spondylosis  (2) neoadjuvant chemotherapy consisting of cyclophosphamide and doxorubicin in dose dense fashion 4 started 02/10/2017, completed 03/24/2017, followed by weekly carboplatin and gemcitabine given days 1 and 8 of each 21-day cycle starting 04/14/2017, completing the planned 4 cycles 06/26/2017  (3) status post left modified radical mastectomy on 08/27/2017 showing an mpT3 pN2 residual invasive ductal carcinoma, grade 2, with a residual cancer burden of 3.  Margins were clear  (4) postmastectomy radiation delayed secondary to persistent drainage, to start ASAP  (a) capecitabine radiosensitization planned  (5) goserelin started 09/21/2017  (a) anastrozole to start after the second dose of goserelin  (6) genetics testing 03/24/2017 through the Common Hereditary Cancer Panel offered by [facility] found no deleterious mutations in APC, ATM, AXIN2, BARD1, BMPR1A, BRCA1, BRCA2, BRIP1, CDH1, CDKN2A (p14ARF), CDKN2A (p16INK4a), CHEK2, CTNNA1, DICER1, EPCAM (Deletion/duplication testing only), GREM1 (promoter region deletion/duplication testing only), KIT, MEN1, MLH1, MSH2, MSH3, MSH6, MUTYH, NBN,  NF1, NHTL1, PALB2,  PDGFRA, PMS2, POLD1, POLE, PTEN, RAD50, RAD51C, RAD51D, SDHB, SDHC, SDHD, SMAD4, SMARCA4. STK11, TP53, TSC1, TSC2, and VHL.  The following genes were evaluated for sequence changes only: SDHA and HOXB13 c.251G>A variant only.    PLAN:  Carrisa's radiation has been delayed because of persistent drainage issues.  She has been working on this with Dr. Marla Roe.  Despite continuing drainage I am hopeful she will be able to start her radiation treatments this month.  She certainly had a significant residual tumor burden and needs all the help she can get to prevent local recurrence  That is also the reason we are doing capecitabine sensitization.  I reviewed the drug and side effects with her today and this was reinforced by our oral chemotherapy pharmacist specialist, Johny Drilling.  I was going to start the patient on anastrozole today, but she is only about to receive her second dose of goserelin today, has had no symptoms of menopause, and I do not want to complicate her situation more than it needs to be.  Accordingly we very likely will not start the anastrozole until the time of her third dose of goserelin or even later.  The patient and family had multiple appropriate questions which were addressed today.  They know to call for any other issues that may develop before the next visit.    Obera Stauch, [name], MD  10/19/17 2:05 PM Medical Oncology and Hematology [facility] [address] [place], [place] [address] Tel. [phone]    Fax. [phone]  This document serves as a record of services personally performed by Chauncey Cruel, MD. It was created on his behalf by Margit Banda, a trained medical scribe. The creation of this record is based on the scribe's personal observations and the provider's statements to them.   I have reviewed the above documentation for accuracy and completeness, and I agree with the above.

## 2017-10-19 ENCOUNTER — Inpatient Hospital Stay: Payer: 59

## 2017-10-19 ENCOUNTER — Inpatient Hospital Stay: Payer: 59 | Attending: Adult Health

## 2017-10-19 ENCOUNTER — Inpatient Hospital Stay (HOSPITAL_BASED_OUTPATIENT_CLINIC_OR_DEPARTMENT_OTHER): Payer: 59 | Admitting: Oncology

## 2017-10-19 ENCOUNTER — Telehealth: Payer: Self-pay | Admitting: Pharmacist

## 2017-10-19 VITALS — BP 157/82 | HR 82 | Temp 98.5°F | Resp 18 | Ht 67.0 in | Wt 278.0 lb

## 2017-10-19 DIAGNOSIS — Z170 Estrogen receptor positive status [ER+]: Secondary | ICD-10-CM

## 2017-10-19 DIAGNOSIS — C773 Secondary and unspecified malignant neoplasm of axilla and upper limb lymph nodes: Secondary | ICD-10-CM | POA: Insufficient documentation

## 2017-10-19 DIAGNOSIS — C50912 Malignant neoplasm of unspecified site of left female breast: Secondary | ICD-10-CM

## 2017-10-19 DIAGNOSIS — C50812 Malignant neoplasm of overlapping sites of left female breast: Secondary | ICD-10-CM

## 2017-10-19 DIAGNOSIS — Z9012 Acquired absence of left breast and nipple: Secondary | ICD-10-CM | POA: Diagnosis not present

## 2017-10-19 DIAGNOSIS — Z5111 Encounter for antineoplastic chemotherapy: Secondary | ICD-10-CM | POA: Diagnosis not present

## 2017-10-19 DIAGNOSIS — Z95828 Presence of other vascular implants and grafts: Secondary | ICD-10-CM

## 2017-10-19 LAB — CBC WITH DIFFERENTIAL/PLATELET
BASOS ABS: 0 10*3/uL (ref 0.0–0.1)
Basophils Relative: 1 %
EOS ABS: 0 10*3/uL (ref 0.0–0.5)
EOS PCT: 1 %
HCT: 36.1 % (ref 34.8–46.6)
HEMOGLOBIN: 11.9 g/dL (ref 11.6–15.9)
LYMPHS PCT: 25 %
Lymphs Abs: 1.4 10*3/uL (ref 0.9–3.3)
MCH: 26.7 pg (ref 25.1–34.0)
MCHC: 33 g/dL (ref 31.5–36.0)
MCV: 81 fL (ref 79.5–101.0)
Monocytes Absolute: 0.5 10*3/uL (ref 0.1–0.9)
Monocytes Relative: 9 %
NEUTROS PCT: 64 %
Neutro Abs: 3.6 10*3/uL (ref 1.5–6.5)
PLATELETS: 268 10*3/uL (ref 145–400)
RBC: 4.45 MIL/uL (ref 3.70–5.45)
RDW: 15 % — ABNORMAL HIGH (ref 11.2–14.5)
WBC: 5.5 10*3/uL (ref 3.9–10.3)

## 2017-10-19 LAB — COMPREHENSIVE METABOLIC PANEL
ALBUMIN: 3.7 g/dL (ref 3.5–5.0)
ALK PHOS: 134 U/L (ref 40–150)
ALT: 31 U/L (ref 0–55)
ANION GAP: 7 (ref 3–11)
AST: 23 U/L (ref 5–34)
BUN: 12 mg/dL (ref 7–26)
CHLORIDE: 105 mmol/L (ref 98–109)
CO2: 28 mmol/L (ref 22–29)
Calcium: 9.6 mg/dL (ref 8.4–10.4)
Creatinine, Ser: 0.95 mg/dL (ref 0.60–1.10)
GFR calc Af Amer: >60 mL/min (ref >60)
GFR calc non Af Amer: >60 mL/min (ref >60)
Glucose, Bld: 94 mg/dL (ref 70–140)
Potassium: 3.7 mmol/L (ref 3.5–5.1)
SODIUM: 140 mmol/L (ref 136–145)
TOTAL PROTEIN: 7.5 g/dL (ref 6.4–8.3)
Total Bilirubin: 0.3 mg/dL (ref 0.2–1.2)

## 2017-10-19 MED ORDER — GOSERELIN ACETATE 3.6 MG ~~LOC~~ IMPL
3.6000 mg | DRUG_IMPLANT | Freq: Once | SUBCUTANEOUS | Status: AC
  Administered 2017-10-19: 3.6 mg via SUBCUTANEOUS

## 2017-10-19 MED ORDER — GOSERELIN ACETATE 3.6 MG ~~LOC~~ IMPL
DRUG_IMPLANT | SUBCUTANEOUS | Status: AC
  Filled 2017-10-19: qty 3.6

## 2017-10-19 NOTE — Telephone Encounter (Signed)
Oral Chemotherapy Pharmacist Encounter   I spoke with patient and family members in exam room for overview of: Xeloda for the adjuvant treatment of stage II a hormone receptor positive breast cancer in conjunction with radiation, planned duration 6-1/2 weeks.   Counseled patient on administration, dosing, side effects, monitoring, drug-food interactions, safe handling, storage, and disposal.  Patient will take Xeloda 500mg  tablets, 2 tablets (1000mg ) by mouth in AM and 2 tabs (1000mg ) by mouth in PM, within 30 minutes of finishing meals, on days of radiation only.  Xeloda and radiation start date: TBD  Adverse effects of Xeloda include but are not limited to: fatigue, decreased blood counts, GI upset, diarrhea, and hand-foot syndrome.  Patient has anti-emetic on hand and knows to take it if nausea develops.   Patient will obtain anti diarrheal and alert the office of 4 or more loose stools above baseline.   Reviewed with patient importance of keeping a medication schedule and plan for any missed doses.  Mrs. Loos voiced understanding and appreciation.   All questions answered.  Patient requests we follow-up with her closer to radiation start date to prepare Xeloda for dispensing.  Xeloda will be made ready at the [facility] per insurance requirement. Insurance authorized 60 tabs per 21 days max Copayment for 1st fill $0.00   Medication reconciliation performed and medication/allergy list updated.  Patient knows to call the office with questions or concerns. Oral Oncology Clinic will continue to follow.  Thank you,  Johny Drilling, PharmD, BCPS, BCOP  10/19/2017  2:39 PM Oral Oncology Clinic [phone]

## 2017-10-19 NOTE — Patient Instructions (Signed)
Goserelin injection What is this medicine? GOSERELIN (GOE se rel in) is similar to a hormone found in the body. It lowers the amount of sex hormones that the body makes. Men will have lower testosterone levels and women will have lower estrogen levels while taking this medicine. In men, this medicine is used to treat prostate cancer; the injection is either given once per month or once every 12 weeks. A once per month injection (only) is used to treat women with endometriosis, dysfunctional uterine bleeding, or advanced breast cancer. This medicine may be used for other purposes; ask your health care provider or pharmacist if you have questions. COMMON BRAND NAME(S): Zoladex What should I tell my health care provider before I take this medicine? They need to know if you have any of these conditions (some only apply to women): -diabetes -heart disease or previous heart attack -high blood pressure -high cholesterol -kidney disease -osteoporosis or low bone density -problems passing urine -spinal cord injury -stroke -tobacco smoker -an unusual or allergic reaction to goserelin, hormone therapy, other medicines, foods, dyes, or preservatives -pregnant or trying to get pregnant -breast-feeding How should I use this medicine? This medicine is for injection under the skin. It is given by a health care professional in a hospital or clinic setting. Men receive this injection once every 4 weeks or once every 12 weeks. Women will only receive the once every 4 weeks injection. Talk to your pediatrician regarding the use of this medicine in children. Special care may be needed. Overdosage: If you think you have taken too much of this medicine contact a poison control center or emergency room at once. NOTE: This medicine is only for you. Do not share this medicine with others. What if I miss a dose? It is important not to miss your dose. Call your doctor or health care professional if you are unable to  keep an appointment. What may interact with this medicine? -female hormones like estrogen -herbal or dietary supplements like black cohosh, chasteberry, or DHEA -female hormones like testosterone -prasterone This list may not describe all possible interactions. Give your health care provider a list of all the medicines, herbs, non-prescription drugs, or dietary supplements you use. Also tell them if you smoke, drink alcohol, or use illegal drugs. Some items may interact with your medicine. What should I watch for while using this medicine? Visit your doctor or health care professional for regular checks on your progress. Your symptoms may appear to get worse during the first weeks of this therapy. Tell your doctor or healthcare professional if your symptoms do not start to get better or if they get worse after this time. Your bones may get weaker if you take this medicine for a long time. If you smoke or frequently drink alcohol you may increase your risk of bone loss. A family history of osteoporosis, chronic use of drugs for seizures (convulsions), or corticosteroids can also increase your risk of bone loss. Talk to your doctor about how to keep your bones strong. This medicine should stop regular monthly menstration in women. Tell your doctor if you continue to menstrate. Women should not become pregnant while taking this medicine or for 12 weeks after stopping this medicine. Women should inform their doctor if they wish to become pregnant or think they might be pregnant. There is a potential for serious side effects to an unborn child. Talk to your health care professional or pharmacist for more information. Do not breast-feed an infant while taking   this medicine. Men should inform their doctors if they wish to father a child. This medicine may lower sperm counts. Talk to your health care professional or pharmacist for more information. What side effects may I notice from receiving this  medicine? Side effects that you should report to your doctor or health care professional as soon as possible: -allergic reactions like skin rash, itching or hives, swelling of the face, lips, or tongue -bone pain -breathing problems -changes in vision -chest pain -feeling faint or lightheaded, falls -fever, chills -pain, swelling, warmth in the leg -pain, tingling, numbness in the hands or feet -signs and symptoms of low blood pressure like dizziness; feeling faint or lightheaded, falls; unusually weak or tired -stomach pain -swelling of the ankles, feet, hands -trouble passing urine or change in the amount of urine -unusually high or low blood pressure -unusually weak or tired Side effects that usually do not require medical attention (report to your doctor or health care professional if they continue or are bothersome): -change in sex drive or performance -changes in breast size in both males and females -changes in emotions or moods -headache -hot flashes -irritation at site where injected -loss of appetite -skin problems like acne, dry skin -vaginal dryness This list may not describe all possible side effects. Call your doctor for medical advice about side effects. You may report side effects to FDA at 1-800-FDA-1088. Where should I keep my medicine? This drug is given in a hospital or clinic and will not be stored at home. NOTE: This sheet is a summary. It may not cover all possible information. If you have questions about this medicine, talk to your doctor, pharmacist, or health care provider.  2018 Elsevier/Gold Standard (2013-08-02 11:10:35)  

## 2017-10-21 ENCOUNTER — Telehealth: Payer: Self-pay | Admitting: General Practice

## 2017-10-21 ENCOUNTER — Ambulatory Visit: Payer: 59 | Attending: Radiation Oncology | Admitting: Physical Therapy

## 2017-10-21 DIAGNOSIS — Z9189 Other specified personal risk factors, not elsewhere classified: Secondary | ICD-10-CM | POA: Diagnosis not present

## 2017-10-21 DIAGNOSIS — M25612 Stiffness of left shoulder, not elsewhere classified: Secondary | ICD-10-CM | POA: Diagnosis not present

## 2017-10-21 DIAGNOSIS — Z483 Aftercare following surgery for neoplasm: Secondary | ICD-10-CM | POA: Diagnosis not present

## 2017-10-21 MED FILL — SULFAMETHOXAZOLE-TMP DS TAB: 800-160 | 5 days supply | Qty: 10 | Fill #0

## 2017-10-21 NOTE — Therapy (Signed)
[place], [place], [address] Phone: [phone]   Fax:  [phone]  Physical Therapy Treatment  Patient Details  Name: Mackenzie [name] MRN: [id number] Date of Birth: [birth year] Referring Provider: Shona Simpson, PA-C   Encounter Date: 10/21/2017  PT End of Session - 10/21/17 1659    Visit Number  2    Number of Visits  9    Date for PT Re-Evaluation  11/20/17    PT Start Time  7673 pt. late for appt.    PT Stop Time  1432    PT Time Calculation (min)  40 min    Activity Tolerance  Patient tolerated treatment well    Behavior During Therapy  WFL for tasks assessed/performed       Past Medical History:  Diagnosis Date  . Anemia   . Breast cancer ([place]) 01/14/2017   Left breast  . Diabetes mellitus    GDM    Past Surgical History:  Procedure Laterality Date  . BREAST RECONSTRUCTION WITH PLACEMENT OF TISSUE EXPANDER AND FLEX HD (ACELLULAR HYDRATED DERMIS) Left 08/27/2017   Procedure: LEFT BREAST RECONSTRUCTION WITH PLACEMENT OF TISSUE EXPANDER AND FLEX HD;  Surgeon: Wallace Going, DO;  Location: [facility];  Service: Plastics;  Laterality: Left;  . CESAREAN SECTION     x2  . MASTECTOMY MODIFIED RADICAL Left 08/27/2017  . MASTECTOMY MODIFIED RADICAL Left 08/27/2017   Procedure: LEFT MODIFIED RADICAL MASTECTOMY;  Surgeon: Erroll Luna, MD;  Location: [facility];  Service: General;  Laterality: Left;  . PORT-A-CATH REMOVAL Right 08/27/2017   Procedure: REMOVAL PORT-A-CATH RIGHT CHEST;  Surgeon: Erroll Luna, MD;  Location: [facility];  Service: General;  Laterality: Right;  . PORTACATH PLACEMENT Right 01/28/2017   Procedure: INSERTION PORT-A-CATH WITH ULTRASOUND;  Surgeon: Erroll Luna, MD;  Location: [facility];  Service: General;  Laterality: Right;  . TUBAL LIGATION      There were no vitals filed for this visit.  Subjective Assessment - 10/21/17 1354    Subjective  The  drain is not out.  I'm going to see them after I leave here because it started hurting really bad yesterday. The amount of drainage hasn't decreased.  Even Dr. Jana Hakim thinks it might be lymphatic drainage. They want to start radiation but they have to take them out before then; doesn't have radiation scheduled. Has been doing the cane exercises, but only to shoulder height.    Pertinent History  Left breast cancer diagnosed 01/10/17 by mammogram and needle biopsy. Then had neo-adjuvant chemo starting 02/10/17 until 06/26/17; had left mastectomy 08/27/17 with ALND and immediate expander placed. Has had 6 fills. Still has her drain and Dr. Marla Roe is monitoring that. Will have radiation for 33 visits but it's not scheduled yet. Will have lat flap surgery after radiation. Allergies seasonal and all the time.    Patient Stated Goals  get ROM, strength back; avoid lymphedema    Currently in Pain?  No/denies         [facility] PT Assessment - 10/21/17 0001      ROM / Strength   AROM / PROM / Strength  PROM      AROM   Left Shoulder Flexion  111 Degrees    Left Shoulder ABduction  86 Degrees      PROM   PROM Assessment Site  Shoulder    Right/Left Shoulder  Left    Left Shoulder Flexion  144 Degrees  Left Shoulder ABduction  106 Degrees    Left Shoulder External Rotation  76 Degrees        LYMPHEDEMA/ONCOLOGY QUESTIONNAIRE - 10/21/17 1358      What other symptoms do you have   Are you having pitting edema  Yes mild    Body Site  arm      Left Upper Extremity Lymphedema   10 cm Proximal to Olecranon Process  42.5 cm    Olecranon Process  32.7 cm    10 cm Proximal to Ulnar Styloid Process  28.3 cm    Just Proximal to Ulnar Styloid Process  20.5 cm    Across Hand at [employer]  21 cm    At [place] of 2nd Digit  6.6 cm                [facility] Adult PT Treatment/Exercise - 10/21/17 0001      Shoulder Exercises: Stretch   Other Shoulder Stretches  in supine, arms behind head,  relax elbows back (approximating radiation position)      Manual Therapy   Manual Therapy  Edema management;Passive ROM    Edema Management  left UE circumference measurements taken    Passive ROM  in supine for left shoulder into er, abduction, flexion; also toward position for radiation, all with gentle progression to patient's tolerance             PT Education - 10/21/17 1417    Education provided  Yes    Education Details  in supine, hands behind head, relax elbows back to stretch    Person(s) Educated  Patient    Methods  Explanation;Tactile cues;Verbal cues    Comprehension  Verbalized understanding          PT Long Term Goals - 10/06/17 1720      PT LONG TERM GOAL #1   Title  Pt. will be independent in HEP for shoulder ROM    Time  4    Period  Weeks      PT LONG TERM GOAL #2   Title  Pt. will be able to get left arm into position for radiation treatment.    Time  4    Period  Weeks    Status  New      PT LONG TERM GOAL #3   Title  left shoulder flexion to 130 degrees    Baseline  right goes to this point at eval    Time  4    Period  Weeks    Status  New      PT LONG TERM GOAL #4   Title  left shoulder abduction to 140 degrees    Baseline  right goes to this point at eval    Time  4    Period  Weeks    Status  New            Plan - 10/21/17 1700    Clinical Impression Statement  Pt. still has drain in place.  She tolerated manual stretching well today, and was instructed in an additional home exercise to prepare her to get into position for radiation.  She was going to see Dr. Marla Roe after her appointment here today, so I asked her to ask Dr. Marla Roe if she is okay to stretch beyond 90 degrees of shoulder flexion and abduction at this point, with drains still in.    Rehab Potential  Good    Clinical Impairments Affecting Rehab Potential  currently has drain in place    PT Frequency  2x / week    PT Duration  4 weeks    PT  Treatment/Interventions  ADLs/Self Care Home Management;Therapeutic exercise;Patient/family education;Manual techniques;Manual lymph drainage;Compression bandaging;Scar mobilization;Passive range of motion    PT Next Visit Plan  Check if Dr. Deetta Perla further stretching with drain still in place. Check HEP.  Continue manual therapy to left shoulder, to progress as able.    PT Home Exercise Plan  standing dowel exercises with flexion and abduction to be done to 90 degrees only until a week after drain is removed; supine hands behind head and bring elbows back    Consulted and Agree with Plan of Care  Patient       Patient will benefit from skilled therapeutic intervention in order to improve the following deficits and impairments:  Decreased range of motion, Impaired UE functional use, Decreased strength, Other (comment)  Visit Diagnosis: Stiffness of left shoulder, not elsewhere classified     Problem List Patient Active Problem List   Diagnosis Date Noted  . Morbid obesity with body mass index (BMI) of 40.0 to 49.9 ([place]) 09/17/2017  . Breast cancer ([place]) 08/27/2017  . Genetic testing 03/25/2017  . Port catheter in place 02/10/2017  . Malignant 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, estrogen receptor positive ([place]) 01/23/2017  . 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

## 2017-10-21 NOTE — Patient Instructions (Signed)
External Rotator Cuff Stretch, Supine    Lie supine, fingers clasped behind head, elbows close together. Pull elbows backward while pinching shoulder blades. Hold _30__ seconds. Repeat __2_ times per session. Do __2_ sessions per day.  Copyright  VHI. All rights reserved.

## 2017-10-21 NOTE — Telephone Encounter (Signed)
[facility] CSW Progress Note  Call to patient after being unable to reach patient by phone.  Patient unavailable.  Spoke w friend who confirmed that patient has been in touch w [facility] providers and is doing well.  Did not have access to phone for past month, has not been able to receive calls or check emails.  Now has phone and access to both.  Informed friend that patient has been enrolled in [facility], provided contact information.  Friend will inform patient of availability of transportation for upcoming radiation treatments.  Edwyna Shell, LCSW Clinical Social Worker Phone:  [phone]

## 2017-10-23 ENCOUNTER — Ambulatory Visit: Payer: 59 | Admitting: Physical Therapy

## 2017-10-23 ENCOUNTER — Telehealth: Payer: Self-pay | Admitting: Oncology

## 2017-10-23 DIAGNOSIS — Z483 Aftercare following surgery for neoplasm: Secondary | ICD-10-CM | POA: Diagnosis not present

## 2017-10-23 DIAGNOSIS — M25612 Stiffness of left shoulder, not elsewhere classified: Secondary | ICD-10-CM

## 2017-10-23 DIAGNOSIS — Z9189 Other specified personal risk factors, not elsewhere classified: Secondary | ICD-10-CM | POA: Diagnosis not present

## 2017-10-23 NOTE — Telephone Encounter (Signed)
Referral to Dr. Lisbeth Renshaw sent via [facility].

## 2017-10-23 NOTE — Telephone Encounter (Signed)
Added f/u to 6/10 lab/inj and adjusted times. Left message for patient. Schedule mailed.

## 2017-10-23 NOTE — Therapy (Signed)
[place], [place], [address] Phone: [phone]   Fax:  [phone]  Physical Therapy Treatment  Patient Details  Name: Mackenzie [name] MRN: [id number] Date of Birth: 25-Sep-1970 Referring Provider: Shona Simpson, PA-C   Encounter Date: 10/23/2017  PT End of Session - 10/23/17 1201    Visit Number  3    Number of Visits  9    Date for PT Re-Evaluation  11/20/17    PT Start Time  8338 pt. late for appt.    PT Stop Time  1100    PT Time Calculation (min)  30 min    Activity Tolerance  Patient tolerated treatment well    Behavior During Therapy  WFL for tasks assessed/performed       Past Medical History:  Diagnosis Date  . Anemia   . Breast cancer ([place]) 01/14/2017   Left breast  . Diabetes mellitus    GDM    Past Surgical History:  Procedure Laterality Date  . BREAST RECONSTRUCTION WITH PLACEMENT OF TISSUE EXPANDER AND FLEX HD (ACELLULAR HYDRATED DERMIS) Left 08/27/2017   Procedure: LEFT BREAST RECONSTRUCTION WITH PLACEMENT OF TISSUE EXPANDER AND FLEX HD;  Surgeon: Wallace Going, DO;  Location: [facility];  Service: Plastics;  Laterality: Left;  . CESAREAN SECTION     x2  . MASTECTOMY MODIFIED RADICAL Left 08/27/2017  . MASTECTOMY MODIFIED RADICAL Left 08/27/2017   Procedure: LEFT MODIFIED RADICAL MASTECTOMY;  Surgeon: Erroll Luna, MD;  Location: [facility];  Service: General;  Laterality: Left;  . PORT-A-CATH REMOVAL Right 08/27/2017   Procedure: REMOVAL PORT-A-CATH RIGHT CHEST;  Surgeon: Erroll Luna, MD;  Location: [facility];  Service: General;  Laterality: Right;  . PORTACATH PLACEMENT Right 01/28/2017   Procedure: INSERTION PORT-A-CATH WITH ULTRASOUND;  Surgeon: Erroll Luna, MD;  Location: [facility];  Service: General;  Laterality: Right;  . TUBAL LIGATION      There were no vitals filed for this visit.  Subjective Assessment - 10/23/17 1032    Subjective  The  doctor said from her standpoint that I can do what range of motion I'm able to do. I saw the PA but will see Dr. Marla Roe today. Was a little sore last time, especially at left pect, but not too bad.    Pertinent History  Left breast cancer diagnosed 01/10/17 by mammogram and needle biopsy. Then had neo-adjuvant chemo starting 02/10/17 until 06/26/17; had left mastectomy 08/27/17 with ALND and immediate expander placed. Has had 6 fills. Still has her drain and Dr. Marla Roe is monitoring that. Will have radiation for 33 visits but it's not scheduled yet. Will have lat flap surgery after radiation. Allergies seasonal and all the time.    Patient Stated Goals  get ROM, strength back; avoid lymphedema    Currently in Pain?  No/denies         [facility] PT Assessment - 10/23/17 0001      Observation/Other Assessments   Observations  Pt. sits with shoulders elevated and very protracted until reminded not to do this.                   [facility] Adult PT Treatment/Exercise - 10/23/17 0001      Neck Exercises: Seated   Neck Retraction  5 reps;5 secs      Shoulder Exercises: Supine   Other Supine Exercises  lie supine over towel roll and let body "melt" down around the towel; then press arms back  into mat for isometric shoulder extension/scapular retraction x 7       Shoulder Exercises: Seated   Other Seated Exercises  active bilat. shoulder er with scapular retraction, 5 counts x 5    Other Seated Exercises  backward shoulder rolls x 5      Manual Therapy   Passive ROM  in supine for left shoulder into er, abduction, flexion; also toward position for radiation, all with gentle progression to patient's tolerance. Also for left shoulder horizontal abduction with left shoulder at edge of mat.                  PT Long Term Goals - 10/06/17 1720      PT LONG TERM GOAL #1   Title  Pt. will be independent in HEP for shoulder ROM    Time  4    Period  Weeks      PT LONG TERM GOAL #2    Title  Pt. will be able to get left arm into position for radiation treatment.    Time  4    Period  Weeks    Status  New      PT LONG TERM GOAL #3   Title  left shoulder flexion to 130 degrees    Baseline  right goes to this point at eval    Time  4    Period  Weeks    Status  New      PT LONG TERM GOAL #4   Title  left shoulder abduction to 140 degrees    Baseline  right goes to this point at eval    Time  4    Period  Weeks    Status  New            Plan - 10/23/17 1201    Clinical Impression Statement  Pt. was late again today so time was limited, but she did well with manual stretching and some exercise today. Dr. Eusebio Friendly P.A. gave her the okay to do stretching as tolerated, though drain is still in place.    Rehab Potential  Good    Clinical Impairments Affecting Rehab Potential  currently has drain in place    PT Frequency  2x / week    PT Duration  4 weeks    PT Treatment/Interventions  ADLs/Self Care Home Management;Therapeutic exercise;Patient/family education;Manual techniques;Manual lymph drainage;Compression bandaging;Scar mobilization;Passive range of motion    PT Next Visit Plan  Check HEP and progress as needed.  Continue manual therapy to left shoulder, to progress as able.    PT Home Exercise Plan  standing dowel exercises with flexion and abduction to be done to 90 degrees only until a week after drain is removed; supine hands behind head and bring elbows back    Consulted and Agree with Plan of Care  Patient       Patient will benefit from skilled therapeutic intervention in order to improve the following deficits and impairments:  Decreased range of motion, Impaired UE functional use, Decreased strength, Other (comment)  Visit Diagnosis: Stiffness of left shoulder, not elsewhere classified     Problem List Patient Active Problem List   Diagnosis Date Noted  . Morbid obesity with body mass index (BMI) of 40.0 to 49.9 ([place]) 09/17/2017  .  Breast cancer ([place]) 08/27/2017  . Genetic testing 03/25/2017  . Port catheter in place 02/10/2017  . Malignant 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, estrogen receptor positive ([place])  01/23/2017  . 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

## 2017-10-26 ENCOUNTER — Ambulatory Visit: Payer: 59 | Admitting: Physical Therapy

## 2017-10-26 DIAGNOSIS — M25612 Stiffness of left shoulder, not elsewhere classified: Secondary | ICD-10-CM

## 2017-10-26 DIAGNOSIS — Z483 Aftercare following surgery for neoplasm: Secondary | ICD-10-CM | POA: Diagnosis not present

## 2017-10-26 DIAGNOSIS — Z9189 Other specified personal risk factors, not elsewhere classified: Secondary | ICD-10-CM | POA: Diagnosis not present

## 2017-10-26 NOTE — Therapy (Signed)
[place], [place], [address] Phone: [phone]   Fax:  [phone]  Physical Therapy Treatment  Patient Details  Name: Mackenzie [name] MRN: [id number] Date of Birth: [birth year] Referring Provider: Shona Simpson, PA-C   Encounter Date: 10/26/2017  PT End of Session - 10/26/17 1536    Visit Number  4    Number of Visits  9    Date for PT Re-Evaluation  11/20/17    PT Start Time  4259    PT Stop Time  1435    PT Time Calculation (min)  40 min    Activity Tolerance  Patient tolerated treatment well    Behavior During Therapy  [facility] for tasks assessed/performed       Past Medical History:  Diagnosis Date  . Anemia   . Breast cancer ([place]) 01/14/2017   Left breast  . Diabetes mellitus    GDM    Past Surgical History:  Procedure Laterality Date  . BREAST RECONSTRUCTION WITH PLACEMENT OF TISSUE EXPANDER AND FLEX HD (ACELLULAR HYDRATED DERMIS) Left 08/27/2017   Procedure: LEFT BREAST RECONSTRUCTION WITH PLACEMENT OF TISSUE EXPANDER AND FLEX HD;  Surgeon: Wallace Going, DO;  Location: [facility];  Service: Plastics;  Laterality: Left;  . CESAREAN SECTION     x2  . MASTECTOMY MODIFIED RADICAL Left 08/27/2017  . MASTECTOMY MODIFIED RADICAL Left 08/27/2017   Procedure: LEFT MODIFIED RADICAL MASTECTOMY;  Surgeon: Erroll Luna, MD;  Location: [facility];  Service: General;  Laterality: Left;  . PORT-A-CATH REMOVAL Right 08/27/2017   Procedure: REMOVAL PORT-A-CATH RIGHT CHEST;  Surgeon: Erroll Luna, MD;  Location: [facility];  Service: General;  Laterality: Right;  . PORTACATH PLACEMENT Right 01/28/2017   Procedure: INSERTION PORT-A-CATH WITH ULTRASOUND;  Surgeon: Erroll Luna, MD;  Location: [facility];  Service: General;  Laterality: Right;  . TUBAL LIGATION      There were no vitals filed for this visit.  Subjective Assessment - 10/26/17 1358    Subjective  "I'm a little tired today."  The drain was removed on Friday. I don't feel like there's fluid building up. Did the stretches a little bit--just the one behind her head.    Pertinent History  Left breast cancer diagnosed 01/10/17 by mammogram and needle biopsy. Then had neo-adjuvant chemo starting 02/10/17 until 06/26/17; had left mastectomy 08/27/17 with ALND and immediate expander placed. Has had 6 fills. Still has her drain and Dr. Marla Roe is monitoring that. Will have radiation for 33 visits but it's not scheduled yet. Will have lat flap surgery after radiation. Allergies seasonal and all the time.    Patient Stated Goals  get ROM, strength back; avoid lymphedema    Currently in Pain?  No/denies                       [facility] Adult PT Treatment/Exercise - 10/26/17 0001      Shoulder Exercises: Supine   Other Supine Exercises  supine hooklying, active bilat. shoulder horizontal abduction x 5, then D2 actively x 10    Other Supine Exercises  supiine hooklying with arms outstretched, lower trunk rotation x 30 seconds each side      Shoulder Exercises: Seated   Other Seated Exercises  active bilat. shoulder er with scapular retraction, 5 counts x 10 focus on scapulae coming down and back      Manual Therapy   Passive ROM  in supine for left shoulder into  er, abduction, flexion; also toward position for radiation, all with gentle progression to patient's tolerance. Also for left shoulder horizontal abduction with left shoulder at edge of mat.      Ankle Exercises: Stretches   Gastroc Stretch  1 rep;10 seconds    Gastroc Stretch Limitations  Instructed to do this at least 3 reps on each side after walking             PT Education - 10/26/17 1429    Education provided  Yes    Education Details  hooklying lower trunk rotation with arms outstretched; also standing gastroc stretches    Person(s) Educated  Patient    Methods  Explanation;Tactile cues;Verbal cues;Handout    Comprehension  Verbalized  understanding;Returned demonstration          PT Long Term Goals - 10/06/17 1720      PT LONG TERM GOAL #1   Title  Pt. will be independent in HEP for shoulder ROM    Time  4    Period  Weeks      PT LONG TERM GOAL #2   Title  Pt. will be able to get left arm into position for radiation treatment.    Time  4    Period  Weeks    Status  New      PT LONG TERM GOAL #3   Title  left shoulder flexion to 130 degrees    Baseline  right goes to this point at eval    Time  4    Period  Weeks    Status  New      PT LONG TERM GOAL #4   Title  left shoulder abduction to 140 degrees    Baseline  right goes to this point at eval    Time  4    Period  Weeks    Status  New            Plan - 10/26/17 1536    Clinical Impression Statement  Pt. did well with stretching today, and appears that she will be able to get into position for radiation treatment.  She did finally get the drain out last Friday.    Rehab Potential  Good    Clinical Impairments Affecting Rehab Potential  drain was removed 10/23/17    PT Frequency  2x / week    PT Duration  4 weeks    PT Treatment/Interventions  ADLs/Self Care Home Management;Therapeutic exercise;Patient/family education;Manual techniques;Manual lymph drainage;Compression bandaging;Scar mobilization;Passive range of motion    PT Next Visit Plan  Check goals. Continue manual therapy to left shoulder, to progress as able. Add pulleys, ball against wall, etc. for AAROM.    PT Home Exercise Plan  standing dowel exercises with flexion and abduction, doorway stretch, hooklying with arms outstretched lower trunk rotation, supine with hands behind head, pull elbows back    Consulted and Agree with Plan of Care  Patient       Patient will benefit from skilled therapeutic intervention in order to improve the following deficits and impairments:  Decreased range of motion, Impaired UE functional use, Decreased strength, Other (comment)  Visit  Diagnosis: Stiffness of left shoulder, not elsewhere classified  Aftercare following surgery for neoplasm     Problem List Patient Active Problem List   Diagnosis Date Noted  . Morbid obesity with body mass index (BMI) of 40.0 to 49.9 ([place]) 09/17/2017  . Breast cancer ([place]) 08/27/2017  . Genetic testing 03/25/2017  .  Port catheter in place 02/10/2017  . Malignant 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, estrogen receptor positive ([place]) 01/23/2017  . 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

## 2017-10-26 NOTE — Patient Instructions (Signed)
Lower Trunk Rotation    Bend both knees and set feet flat on surface. With arms straight out as in picture, rotate knees to one side to feel a stretch at upper chest/anterior shoulder. Hold for 30 seconds. Repeat __1-2__ times per set. Do __1__ sets per session. Do __1-2__ sessions per day.  http://orth.exer.us/151   Copyright  VHI. All rights reserved.

## 2017-10-28 ENCOUNTER — Ambulatory Visit: Payer: 59

## 2017-10-28 DIAGNOSIS — M25612 Stiffness of left shoulder, not elsewhere classified: Secondary | ICD-10-CM

## 2017-10-28 DIAGNOSIS — Z9189 Other specified personal risk factors, not elsewhere classified: Secondary | ICD-10-CM

## 2017-10-28 DIAGNOSIS — Z483 Aftercare following surgery for neoplasm: Secondary | ICD-10-CM | POA: Diagnosis not present

## 2017-10-28 NOTE — Therapy (Signed)
[place], [place], [address] Phone: [phone]   Fax:  [phone]  Physical Therapy Treatment  Patient Details  Name: Mackenzie [name] MRN: [id number] Date of Birth: [birth year] Referring Provider: Shona Simpson, PA-C   Encounter Date: 10/28/2017  PT End of Session - 10/28/17 1653    Visit Number  5    Number of Visits  9    Date for PT Re-Evaluation  11/20/17    PT Start Time  1609    PT Stop Time  1651    PT Time Calculation (min)  42 min    Activity Tolerance  Patient tolerated treatment well    Behavior During Therapy  [facility] for tasks assessed/performed       Past Medical History:  Diagnosis Date  . Anemia   . Breast cancer ([place]) 01/14/2017   Left breast  . Diabetes mellitus    GDM    Past Surgical History:  Procedure Laterality Date  . BREAST RECONSTRUCTION WITH PLACEMENT OF TISSUE EXPANDER AND FLEX HD (ACELLULAR HYDRATED DERMIS) Left 08/27/2017   Procedure: LEFT BREAST RECONSTRUCTION WITH PLACEMENT OF TISSUE EXPANDER AND FLEX HD;  Surgeon: Wallace Going, DO;  Location: [place];  Service: Plastics;  Laterality: Left;  . CESAREAN SECTION     x2  . MASTECTOMY MODIFIED RADICAL Left 08/27/2017  . MASTECTOMY MODIFIED RADICAL Left 08/27/2017   Procedure: LEFT MODIFIED RADICAL MASTECTOMY;  Surgeon: Erroll Luna, MD;  Location: [place];  Service: General;  Laterality: Left;  . PORT-A-CATH REMOVAL Right 08/27/2017   Procedure: REMOVAL PORT-A-CATH RIGHT CHEST;  Surgeon: Erroll Luna, MD;  Location: [place];  Service: General;  Laterality: Right;  . PORTACATH PLACEMENT Right 01/28/2017   Procedure: INSERTION PORT-A-CATH WITH ULTRASOUND;  Surgeon: Erroll Luna, MD;  Location: [place];  Service: General;  Laterality: Right;  . TUBAL LIGATION      There were no vitals filed for this visit.  Subjective Assessment - 10/28/17 1611    Subjective  The drain site is healing  good, it just still feels a little tender.     Pertinent History  Left breast cancer diagnosed 01/10/17 by mammogram and needle biopsy. Then had neo-adjuvant chemo starting 02/10/17 until 06/26/17; had left mastectomy 08/27/17 with ALND and immediate expander placed. Has had 6 fills. Still has her drain and Dr. Marla Roe is monitoring that. Will have radiation for 33 visits but it's not scheduled yet. Will have lat flap surgery after radiation. Allergies seasonal and all the time.    Patient Stated Goals  get ROM, strength back; avoid lymphedema    Currently in Pain?  No/denies                       [facility] Adult PT Treatment/Exercise - 10/28/17 0001      Shoulder Exercises: Standing   ABduction  AROM;Both;5 reps "snow angel" with back against wall    ABduction Limitations  Pt returned therapist demonstration; VCs to hold for 5 sec      Shoulder Exercises: Pulleys   Flexion  2 minutes    Flexion Limitations  VCs throughout to decrease scapular compensations    ABduction  2 minutes    ABduction Limitations  VCs throughout and demonstration to decrease Lt scapular compensation      Shoulder Exercises: Therapy Ball   Flexion  Both;10 reps With forward lean into end of stretch    Flexion Limitations  Pt returned  therapist demonstration    ABduction  Left;5 reps Same side lean into end of stretch    ABduction Limitations  Pt returned therapist demonstration      Manual Therapy   Passive ROM  In supine for left shoulder into abduction, flexion and D2; also toward position for radiation, all with gentle progression to patient's tolerance.                  PT Long Term Goals - 10/06/17 1720      PT LONG TERM GOAL #1   Title  Pt. will be independent in HEP for shoulder ROM    Time  4    Period  Weeks      PT LONG TERM GOAL #2   Title  Pt. will be able to get left arm into position for radiation treatment.    Time  4    Period  Weeks    Status  New      PT LONG  TERM GOAL #3   Title  left shoulder flexion to 130 degrees    Baseline  right goes to this point at eval    Time  4    Period  Weeks    Status  New      PT LONG TERM GOAL #4   Title  left shoulder abduction to 140 degrees    Baseline  right goes to this point at eval    Time  4    Period  Weeks    Status  New            Plan - 10/28/17 1654    Clinical Impression Statement  Progressed pt roday with AA/ROM exercises which she tolerated very well, though did require multiple VCs throughout to decrease Lt>Rt scapular compensations. Continued with P/ROM as well working towards end ROM and radiation positioning which she has mostly achieved,     Rehab Potential  Good    Clinical Impairments Affecting Rehab Potential  drain was removed 10/23/17    PT Frequency  2x / week    PT Duration  4 weeks    PT Treatment/Interventions  ADLs/Self Care Home Management;Therapeutic exercise;Patient/family education;Manual techniques;Manual lymph drainage;Compression bandaging;Scar mobilization;Passive range of motion    PT Next Visit Plan  Check goals. Continue manual therapy to left shoulder, to progress as able. Cont pulleys, ball against wall, etc. for AAROM.    Consulted and Agree with Plan of Care  Patient       Patient will benefit from skilled therapeutic intervention in order to improve the following deficits and impairments:  Decreased range of motion, Impaired UE functional use, Decreased strength, Other (comment)  Visit Diagnosis: Stiffness of left shoulder, not elsewhere classified  Aftercare following surgery for neoplasm  At risk for lymphedema     Problem List Patient Active Problem List   Diagnosis Date Noted  . Morbid obesity with body mass index (BMI) of 40.0 to 49.9 ([place]) 09/17/2017  . Breast cancer ([place]) 08/27/2017  . Genetic testing 03/25/2017  . Port catheter in place 02/10/2017  . 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   

## 2017-10-29 NOTE — Telephone Encounter (Signed)
Oral Oncology Patient Advocate Encounter  Prior Authorization for [employer] has been approved.    PA# [id number] Effective dates: 09/29/2017 through 09/29/2018  Oral Oncology Clinic will continue to follow.

## 2017-11-03 DIAGNOSIS — C50912 Malignant neoplasm of unspecified site of left female breast: Secondary | ICD-10-CM | POA: Diagnosis not present

## 2017-11-04 ENCOUNTER — Ambulatory Visit: Payer: 59 | Admitting: Physical Therapy

## 2017-11-04 DIAGNOSIS — M25612 Stiffness of left shoulder, not elsewhere classified: Secondary | ICD-10-CM | POA: Diagnosis not present

## 2017-11-04 DIAGNOSIS — Z483 Aftercare following surgery for neoplasm: Secondary | ICD-10-CM | POA: Diagnosis not present

## 2017-11-04 DIAGNOSIS — Z9189 Other specified personal risk factors, not elsewhere classified: Secondary | ICD-10-CM | POA: Diagnosis not present

## 2017-11-04 NOTE — Therapy (Signed)
[place], [place], [address] Phone: [phone]   Fax:  [phone]  Physical Therapy Treatment  Patient Details  Name: Mackenzie [name] MRN: [id number] Date of Birth: 09/08/1970 Referring Provider: Shona Simpson, PA-C   Encounter Date: 11/04/2017  PT End of Session - 11/04/17 1527    Visit Number  6    Number of Visits  9    Date for PT Re-Evaluation  11/20/17    PT Start Time  3845 patient late for appt.    PT Stop Time  1432    PT Time Calculation (min)  31 min    Activity Tolerance  Patient tolerated treatment well    Behavior During Therapy  [facility] for tasks assessed/performed       Past Medical History:  Diagnosis Date  . Anemia   . Breast cancer ([place]) 01/14/2017   Left breast  . Diabetes mellitus    GDM    Past Surgical History:  Procedure Laterality Date  . BREAST RECONSTRUCTION WITH PLACEMENT OF TISSUE EXPANDER AND FLEX HD (ACELLULAR HYDRATED DERMIS) Left 08/27/2017   Procedure: LEFT BREAST RECONSTRUCTION WITH PLACEMENT OF TISSUE EXPANDER AND FLEX HD;  Surgeon: Wallace Going, DO;  Location: [place];  Service: Plastics;  Laterality: Left;  . CESAREAN SECTION     x2  . MASTECTOMY MODIFIED RADICAL Left 08/27/2017  . MASTECTOMY MODIFIED RADICAL Left 08/27/2017   Procedure: LEFT MODIFIED RADICAL MASTECTOMY;  Surgeon: Erroll Luna, MD;  Location: [place];  Service: General;  Laterality: Left;  . PORT-A-CATH REMOVAL Right 08/27/2017   Procedure: REMOVAL PORT-A-CATH RIGHT CHEST;  Surgeon: Erroll Luna, MD;  Location: [place];  Service: General;  Laterality: Right;  . PORTACATH PLACEMENT Right 01/28/2017   Procedure: INSERTION PORT-A-CATH WITH ULTRASOUND;  Surgeon: Erroll Luna, MD;  Location: [place];  Service: General;  Laterality: Right;  . TUBAL LIGATION      There were no vitals filed for this visit.  Subjective Assessment - 11/04/17 1404    Subjective  "I  almost forgot about my appointment."    Pertinent History  Left breast cancer diagnosed 01/10/17 by mammogram and needle biopsy. Then had neo-adjuvant chemo starting 02/10/17 until 06/26/17; had left mastectomy 08/27/17 with ALND and immediate expander placed. Has had 6 fills. Still has her drain and Dr. Marla Roe is monitoring that. Will have radiation for 33 visits but it's not scheduled yet. Will have lat flap surgery after radiation. Allergies seasonal and all the time.    Currently in Pain?  No/denies         [facility] PT Assessment - 11/04/17 0001      AROM   Left Shoulder Flexion  120 Degrees    Left Shoulder ABduction  90 Degrees approx.                   [place] Adult PT Treatment/Exercise - 11/04/17 0001      Manual Therapy   Manual Therapy  Myofascial release    Manual therapy comments  Two way stretch with one hand at pt.'s left upper arm, shoulder in abduction, and other hand at mid-abdomen; also with one hand at left upper arm, shoulder in scaption, and other hand at lateral abdomen, then with other hand at left flank. Also unidrectional pull outward at left lateral axilla.    Myofascial Release  see above under manual therapy comments    Passive ROM  in supine for left shoulder into er,  abduction, flexion; also toward position for radiation, all with gentle progression to patient's tolerance.                  PT Long Term Goals - 11/04/17 1404      PT LONG TERM GOAL #1   Title  Pt. will be independent in HEP for shoulder ROM    Status  On-going      PT LONG TERM GOAL #2   Title  Pt. will be able to get left arm into position for radiation treatment.    Status  Achieved      PT LONG TERM GOAL #3   Title  left shoulder flexion to 130 degrees    Baseline  right goes to this point at eval; left to 120 on 11/04/17    Status  Partially Met      PT LONG TERM GOAL #4   Title  left shoulder abduction to 140 degrees    Baseline  right goes to this point at  eval; left to 90 on 11/04/17            Plan - 11/04/17 1528    Clinical Impression Statement  Goals checked. Pt. has met one goal and made progress towards others.  She has tightness that would benefit from myofascial release techniques and continued stretching of left shoulder and chest areas.    Rehab Potential  Good    Clinical Impairments Affecting Rehab Potential  drain was removed 10/23/17    PT Frequency  2x / week    PT Duration  4 weeks    PT Treatment/Interventions  ADLs/Self Care Home Management;Therapeutic exercise;Patient/family education;Manual techniques;Manual lymph drainage;Compression bandaging;Scar mobilization;Passive range of motion    PT Next Visit Plan  Continue manual therapy to left shoulder, including myofascial release and soft tissue mobilization to left upper quadrant. Cont pulleys, ball against wall, etc. for AAROM.    PT Home Exercise Plan  standing dowel exercises with flexion and abduction, doorway stretch, hooklying with arms outstretched lower trunk rotation, supine with hands behind head, pull elbows back    Consulted and Agree with Plan of Care  Patient       Patient will benefit from skilled therapeutic intervention in order to improve the following deficits and impairments:  Decreased range of motion, Impaired UE functional use, Decreased strength, Other (comment)  Visit Diagnosis: Stiffness of left shoulder, not elsewhere classified  Aftercare following surgery for neoplasm     Problem List Patient Active Problem List   Diagnosis Date Noted  . Morbid obesity with body mass index (BMI) of 40.0 to 49.9 ([place]) 09/17/2017  . Breast cancer ([place]) 08/27/2017  . Genetic testing 03/25/2017  . Port catheter in place 02/10/2017  . Malignant 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, estrogen receptor positive ([place]) 01/23/2017  . 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

## 2017-11-05 ENCOUNTER — Ambulatory Visit
Admission: RE | Admit: 2017-11-05 | Discharge: 2017-11-05 | Disposition: A | Discharge status: Home / Self Care | Payer: 59 | Source: Ambulatory Visit | Attending: Radiation Oncology | Admitting: Radiation Oncology

## 2017-11-05 DIAGNOSIS — C50812 Malignant neoplasm of overlapping sites of left female breast: Secondary | ICD-10-CM | POA: Insufficient documentation

## 2017-11-05 DIAGNOSIS — Z510 Encounter for antineoplastic radiation therapy: Secondary | ICD-10-CM | POA: Insufficient documentation

## 2017-11-05 DIAGNOSIS — C50312 Malignant neoplasm of lower-inner quadrant of left female breast: Secondary | ICD-10-CM | POA: Diagnosis not present

## 2017-11-05 DIAGNOSIS — Z170 Estrogen receptor positive status [ER+]: Secondary | ICD-10-CM | POA: Diagnosis not present

## 2017-11-06 ENCOUNTER — Ambulatory Visit: Payer: 59 | Admitting: Physical Therapy

## 2017-11-06 ENCOUNTER — Other Ambulatory Visit: Payer: Self-pay | Admitting: Oncology

## 2017-11-06 ENCOUNTER — Telehealth: Payer: Self-pay | Admitting: *Deleted

## 2017-11-06 DIAGNOSIS — Z483 Aftercare following surgery for neoplasm: Secondary | ICD-10-CM | POA: Diagnosis not present

## 2017-11-06 DIAGNOSIS — M25612 Stiffness of left shoulder, not elsewhere classified: Secondary | ICD-10-CM | POA: Diagnosis not present

## 2017-11-06 DIAGNOSIS — Z9189 Other specified personal risk factors, not elsewhere classified: Secondary | ICD-10-CM | POA: Diagnosis not present

## 2017-11-06 NOTE — Therapy (Signed)
[place], [place], [address] Phone: [phone]   Fax:  [phone]  Physical Therapy Treatment  Patient Details  Name: Mackenzie [name] MRN: [id number] Date of Birth: [birth year] Referring Provider: Shona Simpson, PA-C   Encounter Date: 11/06/2017  PT End of Session - 11/06/17 1213    Visit Number  7    Number of Visits  9    Date for PT Re-Evaluation  11/20/17    PT Start Time  0937    PT Stop Time  1020    PT Time Calculation (min)  43 min    Activity Tolerance  Patient tolerated treatment well    Behavior During Therapy  [facility] for tasks assessed/performed       Past Medical History:  Diagnosis Date  . Anemia   . Breast cancer ([place]) 01/14/2017   Left breast  . Diabetes mellitus    GDM    Past Surgical History:  Procedure Laterality Date  . BREAST RECONSTRUCTION WITH PLACEMENT OF TISSUE EXPANDER AND FLEX HD (ACELLULAR HYDRATED DERMIS) Left 08/27/2017   Procedure: LEFT BREAST RECONSTRUCTION WITH PLACEMENT OF TISSUE EXPANDER AND FLEX HD;  Surgeon: Wallace Going, DO;  Location: [place];  Service: Plastics;  Laterality: Left;  . CESAREAN SECTION     x2  . MASTECTOMY MODIFIED RADICAL Left 08/27/2017  . MASTECTOMY MODIFIED RADICAL Left 08/27/2017   Procedure: LEFT MODIFIED RADICAL MASTECTOMY;  Surgeon: Erroll Luna, MD;  Location: [place];  Service: General;  Laterality: Left;  . PORT-A-CATH REMOVAL Right 08/27/2017   Procedure: REMOVAL PORT-A-CATH RIGHT CHEST;  Surgeon: Erroll Luna, MD;  Location: [place];  Service: General;  Laterality: Right;  . PORTACATH PLACEMENT Right 01/28/2017   Procedure: INSERTION PORT-A-CATH WITH ULTRASOUND;  Surgeon: Erroll Luna, MD;  Location: [place];  Service: General;  Laterality: Right;  . TUBAL LIGATION      There were no vitals filed for this visit.  Subjective Assessment - 11/06/17 0939    Subjective  It's going okay. I've been  having some pain around the left breast. It's hard to describe. I have an appointment with Dr. Marla Roe on Tuesday. The simulation went well yesterday. Will start radiation June 10th.    Pertinent History  Left breast cancer diagnosed 01/10/17 by mammogram and needle biopsy. Then had neo-adjuvant chemo starting 02/10/17 until 06/26/17; had left mastectomy 08/27/17 with ALND and immediate expander placed. Has had 6 fills. Still has her drain and Dr. Marla Roe is monitoring that. Will have radiation for 33 visits but it's not scheduled yet. Will have lat flap surgery after radiation. Allergies seasonal and all the time.    Patient Stated Goals  get ROM, strength back; avoid lymphedema    Currently in Pain?  No/denies left breast pain comes and goes; doesn't know where it came from                       [facility] Adult PT Treatment/Exercise - 11/06/17 0001      Manual Therapy   Myofascial Release  unidirectional release laterally at left lateral axilla, medial axilla, and pect insertion areas; left UE myofascial pulling; bidirectional stretch from left shoulder to right abdomen    Passive ROM  in supine for left shoulder into er, abduction, flexion; also toward position for radiation, all with gentle progression to patient's tolerance; pect minor stretches, then same stretch in supine over towel roll; left shoulder horizontal abduction stretch  PT Long Term Goals - 11/04/17 1404      PT LONG TERM GOAL #1   Title  Pt. will be independent in HEP for shoulder ROM    Status  On-going      PT LONG TERM GOAL #2   Title  Pt. will be able to get left arm into position for radiation treatment.    Status  Achieved      PT LONG TERM GOAL #3   Title  left shoulder flexion to 130 degrees    Baseline  right goes to this point at eval; left to 120 on 11/04/17    Status  Partially Met      PT LONG TERM GOAL #4   Title  left shoulder abduction to 140 degrees    Baseline   right goes to this point at eval; left to 90 on 11/04/17            Plan - 11/06/17 1019    Clinical Impression Statement  "It definitely feels a lot looser," but no pain after treatment.    Rehab Potential  Good    Clinical Impairments Affecting Rehab Potential  drain was removed 10/23/17    PT Frequency  2x / week    PT Duration  4 weeks    PT Treatment/Interventions  ADLs/Self Care Home Management;Therapeutic exercise;Patient/family education;Manual techniques;Manual lymph drainage;Compression bandaging;Scar mobilization;Passive range of motion    PT Next Visit Plan  Continue manual therapy to left shoulder, including myofascial release and soft tissue mobilization to left upper quadrant. Cont pulleys, ball against wall, etc. for AAROM.    PT Home Exercise Plan  standing dowel exercises with flexion and abduction, doorway stretch, hooklying with arms outstretched lower trunk rotation, supine with hands behind head, pull elbows back    Consulted and Agree with Plan of Care  Patient       Patient will benefit from skilled therapeutic intervention in order to improve the following deficits and impairments:  Decreased range of motion, Impaired UE functional use, Decreased strength, Other (comment)  Visit Diagnosis: Stiffness of left shoulder, not elsewhere classified  Aftercare following surgery for neoplasm     Problem List Patient Active Problem List   Diagnosis Date Noted  . Morbid obesity with body mass index (BMI) of 40.0 to 49.9 ([place]) 09/17/2017  . Breast cancer ([place]) 08/27/2017  . Genetic testing 03/25/2017  . Port catheter in place 02/10/2017  . Malignant 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, estrogen receptor positive ([place]) 01/23/2017  . 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

## 2017-11-06 NOTE — Telephone Encounter (Signed)
This RN was requested by MD to contact pt to verify she knows about upcoming appointments. Of note a friend of this patient was seen today by MD who informed MD pt did not know when her next appointments were due to not having a working phone.  Per contact with pt dates of upcoming appointments given.  No other needs at this time.

## 2017-11-09 ENCOUNTER — Ambulatory Visit: Payer: 59 | Attending: Radiation Oncology | Admitting: Physical Therapy

## 2017-11-09 DIAGNOSIS — M25612 Stiffness of left shoulder, not elsewhere classified: Secondary | ICD-10-CM | POA: Insufficient documentation

## 2017-11-09 DIAGNOSIS — Z483 Aftercare following surgery for neoplasm: Secondary | ICD-10-CM | POA: Insufficient documentation

## 2017-11-09 DIAGNOSIS — Z9189 Other specified personal risk factors, not elsewhere classified: Secondary | ICD-10-CM | POA: Diagnosis not present

## 2017-11-09 NOTE — Therapy (Signed)
[place], [place], [address] Phone: [phone]   Fax:  [phone]  Physical Therapy Treatment  Patient Details  Name: Mackenzie [name] MRN: [id number] Date of Birth: [birth year] Referring Provider: Shona Simpson, PA-C   Encounter Date: 11/09/2017  PT End of Session - 11/09/17 1348    Visit Number  8    Number of Visits  9    Date for PT Re-Evaluation  11/20/17    PT Start Time  9417    PT Stop Time  1347    PT Time Calculation (min)  42 min    Activity Tolerance  Patient tolerated treatment well    Behavior During Therapy  [facility] for tasks assessed/performed       Past Medical History:  Diagnosis Date  . Anemia   . Breast cancer ([place]) 01/14/2017   Left breast  . Diabetes mellitus    GDM    Past Surgical History:  Procedure Laterality Date  . BREAST RECONSTRUCTION WITH PLACEMENT OF TISSUE EXPANDER AND FLEX HD (ACELLULAR HYDRATED DERMIS) Left 08/27/2017   Procedure: LEFT BREAST RECONSTRUCTION WITH PLACEMENT OF TISSUE EXPANDER AND FLEX HD;  Surgeon: Wallace Going, DO;  Location: [place];  Service: Plastics;  Laterality: Left;  . CESAREAN SECTION     x2  . MASTECTOMY MODIFIED RADICAL Left 08/27/2017  . MASTECTOMY MODIFIED RADICAL Left 08/27/2017   Procedure: LEFT MODIFIED RADICAL MASTECTOMY;  Surgeon: Erroll Luna, MD;  Location: [place];  Service: General;  Laterality: Left;  . PORT-A-CATH REMOVAL Right 08/27/2017   Procedure: REMOVAL PORT-A-CATH RIGHT CHEST;  Surgeon: Erroll Luna, MD;  Location: [place];  Service: General;  Laterality: Right;  . PORTACATH PLACEMENT Right 01/28/2017   Procedure: INSERTION PORT-A-CATH WITH ULTRASOUND;  Surgeon: Erroll Luna, MD;  Location: [place];  Service: General;  Laterality: Right;  . TUBAL LIGATION      There were no vitals filed for this visit.  Subjective Assessment - 11/09/17 1306    Subjective  It was a busy weekend.  Nothing new. I walked four miles this morning. The left breast is not that bad: I went and got a prosthesis, and it might just be a little heavy. The lighter one feels more comfortable.    Pertinent History  Left breast cancer diagnosed 01/10/17 by mammogram and needle biopsy. Then had neo-adjuvant chemo starting 02/10/17 until 06/26/17; had left mastectomy 08/27/17 with ALND and immediate expander placed. Has had 6 fills. Still has her drain and Dr. Marla Roe is monitoring that. Will have radiation for 33 visits but it's not scheduled yet. Will have lat flap surgery after radiation. Allergies seasonal and all the time.    Patient Stated Goals  get ROM, strength back; avoid lymphedema    Currently in Pain?  No/denies         [facility] PT Assessment - 11/09/17 0001      AROM   Left Shoulder Flexion  127 Degrees after stretches with equipment today    Left Shoulder ABduction  107 Degrees after stretches with equipment today                   [facility] Adult PT Treatment/Exercise - 11/09/17 0001      Shoulder Exercises: Pulleys   Flexion  2 minutes    Flexion Limitations  verbal cues for pacing and positioning    ABduction  2 minutes    ABduction Limitations  verbal cues for pacing and positioning  Shoulder Exercises: Therapy Ball   Flexion  Both;10 reps    Flexion Limitations  cues to lean in at the top    ABduction  Left;10 reps cueing for good stretch at top      Shoulder Exercises: ROM/Strengthening   Other ROM/Strengthening Exercises  standing left shoulder abduction stretch using UE ranger to make it active assist, therapist instructing technique today, 5 reps; same thing for flexion; also left shoulder horizontal abduction x 5      Manual Therapy   Myofascial Release  unidirectional release in lateral direction at left pect insertion area; crosshands technique from left upper outer chest to left medial abdomen in diagonal direction    Passive ROM  in supine for left shoulder  stetch into er, abduction, flexion, and positioning for radiation                  PT Long Term Goals - 11/09/17 1326      PT LONG TERM GOAL #1   Title  Pt. will be independent in HEP for shoulder ROM    Status  Achieved      PT LONG TERM GOAL #2   Title  Pt. will be able to get left arm into position for radiation treatment.    Status  Achieved      PT LONG TERM GOAL #3   Title  left shoulder flexion to 130 degrees    Baseline  right goes to this point at eval; left to 120 on 11/04/17; 127 on 11/09/17    Status  Partially Met      PT LONG TERM GOAL #4   Title  left shoulder abduction to 140 degrees    Baseline  right goes to this point at eval; left to 90 on 11/04/17; 107 on 11/09/17            Plan - 11/09/17 1348    Clinical Impression Statement  Pt. has made nice gains in left shoulder AROM, though she has not yet met goals for either flexion nor abduction. She will start radiation in one week, and would like to continue therapy. Since she has not yet met goals, she will benefit from continuing.    Rehab Potential  Good    Clinical Impairments Affecting Rehab Potential  drain was removed 10/23/17    PT Frequency  2x / week    PT Duration  4 weeks    PT Treatment/Interventions  ADLs/Self Care Home Management;Therapeutic exercise;Patient/family education;Manual techniques;Manual lymph drainage;Compression bandaging;Scar mobilization;Passive range of motion    PT Next Visit Plan  Renew next visit. Continue manual therapy to left shoulder, including myofascial release and soft tissue mobilization to left upper quadrant. Cont pulleys, ball against wall, etc. for AAROM.    PT Home Exercise Plan  standing dowel exercises with flexion and abduction, doorway stretch, hooklying with arms outstretched lower trunk rotation, supine with hands behind head, pull elbows back    Consulted and Agree with Plan of Care  Patient       Patient will benefit from skilled therapeutic  intervention in order to improve the following deficits and impairments:  Decreased range of motion, Impaired UE functional use, Decreased strength, Other (comment)  Visit Diagnosis: Stiffness of left shoulder, not elsewhere classified  Aftercare following surgery for neoplasm     Problem List Patient Active Problem List   Diagnosis Date Noted  . Morbid obesity with body mass index (BMI) of 40.0 to 49.9 ([place]) 09/17/2017  . Breast  cancer ([place]) 08/27/2017  . Genetic testing 03/25/2017  . Port catheter in place 02/10/2017  . Malignant 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, estrogen receptor positive ([place]) 01/23/2017  . 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

## 2017-11-09 NOTE — Progress Notes (Signed)
  Radiation Oncology         ([phone]) [phone] ________________________________  Name: Mackenzie [name] MRN: [id number]  Date: 11/05/2017  DOB: 06-08-1971  Diagnosis DIAGNOSIS:     ICD-10-CM   1. Malignant 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, estrogen receptor positive ([place]) C50.812    Z17.0      SIMULATION AND TREATMENT PLANNING NOTE  The patient presented for simulation prior to beginning her course of radiation treatment for her diagnosis of left-sided breast cancer. The patient was placed in a supine position on a breast board. A customized vac-lock bag was also constructed and this complex treatment device will be used on a daily basis during her treatment. In this fashion, a CT scan was obtained through the chest area and an isocenter was placed near the chest wall at the upper aspect of the right chest. A breath-hold technique has also been evaluated to determine if this significantly improves the spatial relationship between the target region and the heart. Based on this analysis, a breath-hold technique has been ordered for the patient's treatment.  The patient will be planned to receive a course of radiation initially to a dose of 50.4 gray. This will consist of a 4 field technique targeting the left chest wall as well as the supraclavicular region. Therefore 2 customized medial and lateral tangent fields have been created targeting the chest wall, and also 2 additional customized fields have been designed to treat the supraclavicular region both with a left supraclavicular field and a left posterior axillary boost field. A forward planning/reduced field technique will also be evaluated to determine if this significantly improves the dose homogeneity of the overall plan. Therefore, additional customized blocks/fields may be necessary.  This initial treatment will be accomplished at 1.8 gray per fraction.   The initial plan will consist of a 3-D conformal technique. The  target volume/scar, heart and lungs have been contoured and dose volume histograms of each of these structures will be evaluated as part of the 3-D conformal treatment planning process.   It is anticipated that the patient will then receive a 10 gray boost to the surgical scar. This will be accomplished at 2 gray per fraction. The final anticipated total dose therefore will correspond to 60.4 gray.   Special treatment procedure was performed today due to the extra time and effort required by myself to plan and prepare this patient for deep inspiration breath hold technique.  I have determined cardiac sparing to be of benefit to this patient to prevent long term cardiac damage due to radiation of the heart.  Bellows were placed on the patient's abdomen. To facilitate cardiac sparing, the patient was coached by the radiation therapists on breath hold techniques and breathing practice was performed. Practice waveforms were obtained. The patient was then scanned while maintaining breath hold in the treatment position.  This image was then transferred over to the imaging specialist. The imaging specialist then created a fusion of the free breathing and breath hold scans using the chest wall as the stable structure. I personally reviewed the fusion in axial, coronal and sagittal image planes.  Excellent cardiac sparing was obtained.  I felt the patient is an appropriate candidate for breath hold and the patient will be treated as such.  The image fusion was then reviewed with the patient to reinforce the necessity of reproducible breath hold.    _______________________________   Jodelle Gross, MD, PhD

## 2017-11-09 NOTE — Progress Notes (Signed)
  Radiation Oncology         [phone] ________________________________  Name: Mackenzie [name] MRN: [id number]  Date: 11/05/2017  DOB: Mar 03, 1971  Optical Surface Tracking Plan:  Since intensity modulated radiotherapy (IMRT) and 3D conformal radiation treatment methods are predicated on accurate and precise positioning for treatment, intrafraction motion monitoring is medically necessary to ensure accurate and safe treatment delivery.  The ability to quantify intrafraction motion without excessive ionizing radiation dose can only be performed with optical surface tracking. Accordingly, surface imaging offers the opportunity to obtain 3D measurements of patient position throughout IMRT and 3D treatments without excessive radiation exposure.  I am ordering optical surface tracking for this patient's upcoming course of radiotherapy. ________________________________  Kyung Rudd, MD 11/09/2017 10:52 AM    Reference:   Ursula Alert, J, et al. Surface imaging-based analysis of intrafraction motion for breast radiotherapy patients.Journal of Wiley, n. 6, nov. 2014. ISSN [id number].   Available at: <http://www.jacmp.org/index.php/jacmp/article/view/4957>.

## 2017-11-11 ENCOUNTER — Ambulatory Visit: Payer: 59 | Admitting: Physical Therapy

## 2017-11-11 DIAGNOSIS — Z9189 Other specified personal risk factors, not elsewhere classified: Secondary | ICD-10-CM | POA: Diagnosis not present

## 2017-11-11 DIAGNOSIS — M25612 Stiffness of left shoulder, not elsewhere classified: Secondary | ICD-10-CM | POA: Diagnosis not present

## 2017-11-11 DIAGNOSIS — Z483 Aftercare following surgery for neoplasm: Secondary | ICD-10-CM | POA: Diagnosis not present

## 2017-11-11 MED FILL — CAPECITABINE 500 MG TABLET: 500 | 20 days supply | Qty: 60 | Fill #0

## 2017-11-11 NOTE — Therapy (Signed)
[place], [place], [address] Phone: [phone]   Fax:  [phone]  Physical Therapy Treatment  Patient Details  Name: Mackenzie [name] MRN: [id number] Date of Birth: 01-15-1971 Referring Provider: Shona Simpson, PA-C   Encounter Date: 11/11/2017  PT End of Session - 11/11/17 1631    Visit Number  9    Number of Visits  17    Date for PT Re-Evaluation  12/18/17    PT Start Time  8099 patient late today    PT Stop Time  1429    PT Time Calculation (min)  33 min    Activity Tolerance  Patient tolerated treatment well    Behavior During Therapy  [facility] for tasks assessed/performed       Past Medical History:  Diagnosis Date  . Anemia   . Breast cancer ([place]) 01/14/2017   Left breast  . Diabetes mellitus    GDM    Past Surgical History:  Procedure Laterality Date  . BREAST RECONSTRUCTION WITH PLACEMENT OF TISSUE EXPANDER AND FLEX HD (ACELLULAR HYDRATED DERMIS) Left 08/27/2017   Procedure: LEFT BREAST RECONSTRUCTION WITH PLACEMENT OF TISSUE EXPANDER AND FLEX HD;  Surgeon: Wallace Going, DO;  Location: [place];  Service: Plastics;  Laterality: Left;  . CESAREAN SECTION     x2  . MASTECTOMY MODIFIED RADICAL Left 08/27/2017  . MASTECTOMY MODIFIED RADICAL Left 08/27/2017   Procedure: LEFT MODIFIED RADICAL MASTECTOMY;  Surgeon: Erroll Luna, MD;  Location: [place];  Service: General;  Laterality: Left;  . PORT-A-CATH REMOVAL Right 08/27/2017   Procedure: REMOVAL PORT-A-CATH RIGHT CHEST;  Surgeon: Erroll Luna, MD;  Location: [place];  Service: General;  Laterality: Right;  . PORTACATH PLACEMENT Right 01/28/2017   Procedure: INSERTION PORT-A-CATH WITH ULTRASOUND;  Surgeon: Erroll Luna, MD;  Location: [place];  Service: General;  Laterality: Right;  . TUBAL LIGATION      There were no vitals filed for this visit.  Subjective Assessment - 11/11/17 1358    Subjective  "Dr.  Marla Roe said the breast was a little swollen so that might be why it's hurting; she said I need to massage it a bit."    Pertinent History  Left breast cancer diagnosed 01/10/17 by mammogram and needle biopsy. Then had neo-adjuvant chemo starting 02/10/17 until 06/26/17; had left mastectomy 08/27/17 with ALND and immediate expander placed. Has had 6 fills. Still has her drain and Dr. Marla Roe is monitoring that. Will have radiation for 33 visits but it's not scheduled yet. Will have lat flap surgery after radiation. Allergies seasonal and all the time.    Patient Stated Goals  get ROM, strength back; avoid lymphedema    Currently in Pain?  No/denies                       [facility] Adult PT Treatment/Exercise - 11/11/17 0001      Shoulder Exercises: Standing   External Rotation  AROM;Both;10 reps with scapular retraction    ABduction  AROM;Both;5 reps back against wall keeping arms back, "robber's pose"    ABduction Limitations  Also bilat. active abduction x 10    Other Standing Exercises  3-way arm raises with 2 lb. weights on wrists x 10 each way in front of mirror      Shoulder Exercises: Pulleys   Flexion  2 minutes    Flexion Limitations  verbal cues to maximize ROM    ABduction  2  minutes    ABduction Limitations  cues to avoid shoulder elevation      Shoulder Exercises: Therapy Ball   Flexion  Both;10 reps 2 lb. weight on each wrist    ABduction  Left;10 reps 2 lb. weight on wrist      Manual Therapy   Passive ROM  in supine for left shoulder stetch into er, abduction, flexion, and positioning for radiation                  PT Long Term Goals - 11/11/17 1636      PT LONG TERM GOAL #1   Title  Pt. will be independent in HEP for shoulder ROM    Status  Achieved      PT LONG TERM GOAL #2   Title  Pt. will be able to get left arm into position for radiation treatment.    Status  Achieved      PT LONG TERM GOAL #3   Title  left shoulder flexion to 130  degrees    Baseline  right goes to this point at eval; left to 120 on 11/04/17; 127 on 11/09/17    Status  Partially Met      PT LONG TERM GOAL #4   Title  left shoulder abduction to 140 degrees    Baseline  right goes to this point at eval; left to 90 on 11/04/17; 107 on 11/09/17    Status  Partially Met            Plan - 11/11/17 1633    Clinical Impression Statement  Pt. has made gains toward goals but has not fully met them, so will benefit from continuing therapy. She will start radiation next week. New strengthening exercises added today were challenging for her.    Rehab Potential  Good    Clinical Impairments Affecting Rehab Potential  drain was removed 10/23/17    PT Frequency  2x / week    PT Duration  4 weeks    PT Treatment/Interventions  ADLs/Self Care Home Management;Therapeutic exercise;Patient/family education;Manual techniques;Manual lymph drainage;Compression bandaging;Scar mobilization;Passive range of motion    PT Next Visit Plan  Renewal done this visit. Continue manual therapy to left shoulder, including myofascial release and soft tissue mobilization to left upper quadrant. Cont pulleys, ball against wall, etc. for AAROM. and continue strengthening such as was done today.    PT Home Exercise Plan  standing dowel exercises with flexion and abduction, doorway stretch, hooklying with arms outstretched lower trunk rotation, supine with hands behind head, pull elbows back    Consulted and Agree with Plan of Care  Patient       Patient will benefit from skilled therapeutic intervention in order to improve the following deficits and impairments:  Decreased range of motion, Impaired UE functional use, Decreased strength, Other (comment)  Visit Diagnosis: Stiffness of left shoulder, not elsewhere classified - Plan: PT plan of care cert/re-cert  Aftercare following surgery for neoplasm - Plan: PT plan of care cert/re-cert     Problem List Patient Active Problem List    Diagnosis Date Noted  . Morbid obesity with body mass index (BMI) of 40.0 to 49.9 ([place]) 09/17/2017  . Breast cancer ([place]) 08/27/2017  . Genetic testing 03/25/2017  . Port catheter in place 02/10/2017  . Malignant 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, estrogen receptor positive ([place]) 01/23/2017  . 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

## 2017-11-12 ENCOUNTER — Ambulatory Visit
Admission: RE | Admit: 2017-11-12 | Discharge: 2017-11-12 | Disposition: A | Discharge status: Home / Self Care | Payer: 59 | Source: Ambulatory Visit | Attending: Radiation Oncology | Admitting: Radiation Oncology

## 2017-11-12 DIAGNOSIS — Z510 Encounter for antineoplastic radiation therapy: Secondary | ICD-10-CM | POA: Diagnosis not present

## 2017-11-12 DIAGNOSIS — Z9012 Acquired absence of left breast and nipple: Secondary | ICD-10-CM | POA: Diagnosis not present

## 2017-11-12 DIAGNOSIS — Z170 Estrogen receptor positive status [ER+]: Secondary | ICD-10-CM | POA: Insufficient documentation

## 2017-11-12 DIAGNOSIS — C50812 Malignant neoplasm of overlapping sites of left female breast: Secondary | ICD-10-CM | POA: Diagnosis not present

## 2017-11-12 DIAGNOSIS — C50312 Malignant neoplasm of lower-inner quadrant of left female breast: Secondary | ICD-10-CM | POA: Diagnosis not present

## 2017-11-13 NOTE — Progress Notes (Signed)
[place]  [phone] [phone]     ID: Mackenzie [name] DOB: 01/14/1971  MR#: [id number]  QMG#:[id number]  Patient Care Team: Willey Blade, MD as PCP - General (Internal Medicine) Mindie Rawdon, [name], MD as Consulting Physician (Oncology) Erroll Luna, MD as Consulting Physician (General Surgery) Kyung Rudd, MD as Consulting Physician (Radiation Oncology) Dillingham, Loel Lofty, DO as Attending Physician (Plastic Surgery) OTHER MD:  CHIEF COMPLAINT: Estrogen receptor positive breast cancer  CURRENT TREATMENT: Adjuvant radiation with capecitabine sensitization   HISTORY OF CURRENT ILLNESS: From the original intake note:  The patient herself noted some changes in her left breast late July 2018, she says, and eventually brought this to medical attention so that on 01/09/2017 she underwent bilateral diagnostic mammography with tomography and left breast ultrasonography at the breast Center. This found the breast density to be category C. In the upper inner quadrant of the left breast there was an area of asymmetry and there were malignant type calcifications involving all 4 quadrants. On exam there is firmness and palpable thickening in the anterior left breast with skin dimpling. Ultrasonography found at the 9:30 o'clock radiant 3 cm from the nipple a 2.7 cm mass and in the left axilla for abnormal lymph nodes largest of which measured 2.7 cm.  Biopsy of the left breast 9:30 o'clock mass 80 01/26/2017 showed ([id number]) invasive ductal carcinoma, with extracellular mucin, grade 1 or 2. In the lower outer left breast is separated biopsy the same day showed ductal carcinoma in situ. One of the 4 lymph nodes involved was positive for metastatic carcinoma. Prognostic panel on the invasive disease showed it to be estrogen receptor 100% positive, progesterone receptor 20% positive, both with strong staining intensity, with an MIB-1 of 20%, and no HER-2  amplification with a signals ratio 1.38 and the number per cell 2.90.  On 01/22/2017 the patient underwent bilateral breast MRI. This showed no involvement of the right breast, but in the left breast there was a masslike and non-masslike enhancement involving all quadrants, with skin swelling but no abnormal enhancement of the skin or nipple areolar complex or pectoralis muscle. The mass could not be clearly measured but spanned approximately 12 cm. There was bulky left axillary lymphadenopathy, with the largest lymph node measuring up to 3.4 cm.  The patient's subsequent history is as detailed below.  INTERVAL HISTORY: Mackenzie [name] returns today for evaluation and treatment of her estrogen receptor positive breast cancer accompanied by her mother. She begins adjuvant radiation treatments today.   She has not yet started capecitabine nor anastrozole.   She notes that she received two goserelin injections. She tolerated this well, and she denies any side affects or menopause symptoms.    REVIEW OF SYSTEMS: Jenah reports that she is doing well. She denies unusual headaches, visual changes, nausea, vomiting, or dizziness. There has been no unusual cough, phlegm production, or pleurisy. This been no change in bowel or bladder habits. She denies unexplained fatigue or unexplained weight loss, bleeding, rash, or fever. A detailed review of systems was otherwise stable.   PAST MEDICAL HISTORY: Past Medical History:  Diagnosis Date  . Anemia   . Breast cancer ([facility]) 01/14/2017   Left breast  . Diabetes mellitus    GDM    PAST SURGICAL HISTORY: Past Surgical History:  Procedure Laterality Date  . BREAST RECONSTRUCTION WITH PLACEMENT OF TISSUE EXPANDER AND FLEX HD (ACELLULAR HYDRATED DERMIS) Left 08/27/2017   Procedure: LEFT BREAST RECONSTRUCTION WITH PLACEMENT OF TISSUE  EXPANDER AND FLEX HD;  Surgeon: Wallace Going, DO;  Location: [facility];  Service: Plastics;  Laterality: Left;  . CESAREAN  SECTION     x2  . MASTECTOMY MODIFIED RADICAL Left 08/27/2017  . MASTECTOMY MODIFIED RADICAL Left 08/27/2017   Procedure: LEFT MODIFIED RADICAL MASTECTOMY;  Surgeon: Erroll Luna, MD;  Location: [facility];  Service: General;  Laterality: Left;  . PORT-A-CATH REMOVAL Right 08/27/2017   Procedure: REMOVAL PORT-A-CATH RIGHT CHEST;  Surgeon: Erroll Luna, MD;  Location: [facility];  Service: General;  Laterality: Right;  . PORTACATH PLACEMENT Right 01/28/2017   Procedure: INSERTION PORT-A-CATH WITH ULTRASOUND;  Surgeon: Erroll Luna, MD;  Location: [facility];  Service: General;  Laterality: Right;  . TUBAL LIGATION      FAMILY HISTORY Family History  Problem Relation Age of Onset  . Breast cancer Paternal Grandmother 63       d.60s from breast cancer. Did not have treatment.  . Other Mother        V.25 from complications of surgery to remove brain tumor  The patient's father still alive at age 49. The patient's mother died with a brain tumor which the patient says was "benign". She was 49. The patient has one brother, no sisters. The only breast cancer in the family is a paternal grandmother who died from breast cancer at an unknown age. There is no history of ovarian or prostate cancer in the family.  GYNECOLOGIC HISTORY:  No LMP recorded. (Menstrual status: Chemotherapy). Menarche age 47 and first live birth age 5 she is Baring P4. The person is still having regular periods as of August 2018  SOCIAL HISTORY: (As of August 2018) Belicia works at [employer] on the fifth floor, [place]. Her husband, [name], is a Building control surveyor. Daughter and married and lives in [place] and works as a Education administrator. Daughter, [name] lives in [place] and works in the police department. Daughters, [place] and [place] are 32 and 6, living at home. The patient has no grandchildren. She attends a Tour manager   ADVANCED DIRECTIVES: Not in place   HEALTH MAINTENANCE: Social History    Tobacco Use  . Smoking status: Never Smoker  . Smokeless tobacco: Never Used  Substance Use Topics  . Alcohol use: Yes    Comment: social  . Drug use: No     Colonoscopy: Never  PAP: December 2017  Bone density: Never   No Known Allergies  Current Outpatient Medications  Medication Sig Dispense Refill  . acetaminophen (TYLENOL) 500 MG tablet Take 1,000 mg by mouth every 6 (six) hours as needed (for pain/headaches.).    Marland Kitchen B Complex Vitamins (VITAMIN B COMPLEX PO) Take 1 capsule by mouth 2 (two) times daily.     . benzonatate (TESSALON) 100 MG capsule Take 100 mg by mouth every 8 (eight) hours as needed for cough.   0  . Calcium Carb-Cholecalciferol (CALCIUM 600+D3 PO) Take 1 tablet by mouth 2 (two) times daily.    . capecitabine (XELODA) 500 MG tablet Take 2 tablets ('1000mg'$ ) by mouth twice daily, within 30 minutes after meals. Take on days of radiation only, M-F (Patient not taking: Reported on 10/06/2017) 140 tablet 0  . cetirizine (ZYRTEC) 10 MG tablet Take 10 mg by mouth daily.      . Cholecalciferol (VITAMIN D-3) 5000 units TABS Take 5,000 Units by mouth 2 (two) times daily.    Marland Kitchen CRANBERRY PO Take 1 tablet by mouth 4 (four) times daily. 3-4 times daily    .  diazepam (VALIUM) 2 MG tablet Take 1 tablet (2 mg total) by mouth every 12 (twelve) hours as needed for muscle spasms. 30 tablet 0  . ibuprofen (ADVIL,MOTRIN) 200 MG tablet Take 800 mg by mouth every 8 (eight) hours as needed (for pain.).    Marland Kitchen loratadine (CLARITIN) 10 MG tablet Take 1 tablet (10 mg total) by mouth daily. 90 tablet 2  . LORazepam (ATIVAN) 0.5 MG tablet Take 1 tablet (0.5 mg total) by mouth every 8 (eight) hours as needed for anxiety. 30 tablet 0  . menthol-cetylpyridinium (CEPACOL) 3 MG lozenge Take 1 lozenge (3 mg total) by mouth as needed for sore throat (throat irritation / pain). (Patient not taking: Reported on 10/06/2017) 100 tablet 12  . montelukast (SINGULAIR) 10 MG tablet Take 10 mg by mouth daily as  needed (for allergies.).     Marland Kitchen Multiple Vitamin (MULTIVITAMIN WITH MINERALS) TABS tablet Take 1 tablet by mouth daily.    Marland Kitchen nystatin (MYCOSTATIN) 100000 UNIT/ML suspension Take 5 mLs (500,000 Units total) by mouth 4 (four) times daily. (Patient not taking: Reported on 08/19/2017) 240 mL 0  . Omega-3 Fatty Acids (FISH OIL) 1000 MG CAPS Take 2,000 mg by mouth daily.     . ondansetron (ZOFRAN) 8 MG tablet Take 8 mg by mouth every 8 (eight) hours as needed for nausea or vomiting.    . ondansetron (ZOFRAN-ODT) 4 MG disintegrating tablet Take 1 tablet (4 mg total) by mouth every 6 (six) hours as needed for nausea. (Patient not taking: Reported on 09/15/2017) 20 tablet 0  . oxyCODONE (OXY IR/ROXICODONE) 5 MG immediate release tablet Take 1-2 tablets (5-10 mg total) by mouth every 6 (six) hours as needed for moderate pain. 30 tablet 0  . polyethylene glycol (MIRALAX / GLYCOLAX) packet Take 17 g by mouth daily as needed for mild constipation. (Patient not taking: Reported on 09/15/2017) 14 each 0  . PREVIDENT 5000 BOOSTER PLUS 1.1 % PSTE BRUSH ONCE A DAY IN PLACE OF REGULAR TOOTHPASTE  2  . prochlorperazine (COMPAZINE) 10 MG tablet Take 10 mg by mouth every 6 (six) hours as needed for nausea or vomiting.   1  . senna (SENOKOT) 8.6 MG TABS tablet Take 1 tablet (8.6 mg total) by mouth 2 (two) times daily. 120 each 0  . sulfamethoxazole-trimethoprim (BACTRIM,SEPTRA) 400-80 MG tablet Take 1 tablet by mouth 2 (two) times daily.    Marland Kitchen terbinafine (LAMISIL) 250 MG tablet Take 1 tablet (250 mg total) daily by mouth. (Patient not taking: Reported on 10/06/2017) 90 tablet 0   No current facility-administered medications for this visit.    Facility-Administered Medications Ordered in Other Visits  Medication Dose Route Frequency Provider Last Rate Last Dose  . goserelin (ZOLADEX) injection 3.6 mg  3.6 mg Subcutaneous Once Paitynn Mikus, [name], MD      . sodium chloride flush (NS) 0.9 % injection 10 mL  10 mL Intravenous PRN  Na Waldrip, [name], MD   10 mL at 03/10/17 1239    OBJECTIVE: Middle-aged African-American woman who looks well  Vitals:   11/16/17 1211  BP: 137/84  Pulse: 98  Resp: 18  Temp: 98.4 F (36.9 C)  SpO2: 98%     Body mass index is 42.79 kg/m.   Wt Readings from Last 3 Encounters:  11/16/17 273 lb 3.2 oz (123.9 kg)  10/19/17 278 lb (126.1 kg)  09/17/17 285 lb 12.8 oz (129.6 kg)  ECOG FS:1   Sclerae unicteric, EOMs intact Oropharynx clear and moist No  cervical or supraclavicular adenopathy Lungs no rales or rhonchi Heart regular rate and rhythm Abd soft, nontender, positive bowel sounds MSK no focal spinal tenderness, no upper extremity lymphedema Neuro: nonfocal, well oriented, appropriate affect Breasts: The right breast is unremarkable.  The left breast is status post mastectomy with expander in place.  There is no evidence of residual or recurrent disease.  Both axillae are benign.  LAB RESULTS:    Appointment on 11/16/2017  Component Date Value Ref Range Status  . WBC 11/16/2017 6.8  3.9 - 10.3 K/uL Final  . RBC 11/16/2017 4.65  3.70 - 5.45 MIL/uL Final  . Hemoglobin 11/16/2017 12.1  11.6 - 15.9 g/dL Final  . HCT 11/16/2017 37.5  34.8 - 46.6 % Final  . MCV 11/16/2017 80.6  79.5 - 101.0 fL Final  . MCH 11/16/2017 26.0  25.1 - 34.0 pg Final  . MCHC 11/16/2017 32.2  31.5 - 36.0 g/dL Final  . RDW 11/16/2017 15.3* 11.2 - 14.5 % Final  . Platelets 11/16/2017 264  145 - 400 K/uL Final  . Neutrophils Relative % 11/16/2017 72  % Final  . Neutro Abs 11/16/2017 4.9  1.5 - 6.5 K/uL Final  . Lymphocytes Relative 11/16/2017 19  % Final  . Lymphs Abs 11/16/2017 1.3  0.9 - 3.3 K/uL Final  . Monocytes Relative 11/16/2017 8  % Final  . Monocytes Absolute 11/16/2017 0.5  0.1 - 0.9 K/uL Final  . Eosinophils Relative 11/16/2017 0  % Final  . Eosinophils Absolute 11/16/2017 0.0  0.0 - 0.5 K/uL Final  . Basophils Relative 11/16/2017 1  % Final  . Basophils Absolute 11/16/2017 0.1  0.0  - 0.1 K/uL Final   Performed at [facility], [place] [address], [place], [place] [address]       STUDIES: No results found.   ELIGIBLE FOR AVAILABLE RESEARCH PROTOCOL: no  ASSESSMENT: 47 y.o. [place] woman status post left breast upper inner quadrant biopsy 01/14/2017 for a clinical T3 N2, stage IIA invasive ductal carcinoma, grade 1 or 2, estrogen and progesterone receptor positive, HER-2 nonamplified, with an MIB-1 of 20%.  (1) staging studies: Brain MRI, bone scan, and CT scan of the chest 02/05/2017 showed no brain lesions, no lung or liver lesions, a 4.9 cm mass in the left breast with left axillary and subpectoral adenopathy, and nonspecific bone scan tracer at L2, left scapula, and anterior ribs, with lumbar spine MRI suggested for further evaluation.  (a) lumbar spine MRI 02/17/2017 showed no normal bone lesions.  There was mild lumbar spondylosis  (2) neoadjuvant chemotherapy consisting of cyclophosphamide and doxorubicin in dose dense fashion 4 started 02/10/2017, completed 03/24/2017, followed by weekly carboplatin and gemcitabine given days 1 and 8 of each 21-day cycle starting 04/14/2017, completing the planned 4 cycles 06/26/2017  (3) is post left modified radical mastectomy on 08/27/2017 showing an mpT3 pN2 residual invasive ductal carcinoma, grade 2, with a residual cancer burden of 3.  Margins were clear  (a) TRAM flap reconstruction planned  (4) postmastectomy radiation to finish 01/02/2015  (a) capecitabine radiosensitization started 11/16/2017  (5) goserelin started 09/21/2017  (a) anastrozole to start 10/19/2017  (6) genetics testing 03/24/2017 through the Common Hereditary Cancer Panel offered by [facility] found no deleterious mutations in APC, ATM, AXIN2, BARD1, BMPR1A, BRCA1, BRCA2, BRIP1, CDH1, CDKN2A (p14ARF), CDKN2A (p16INK4a), CHEK2, CTNNA1, DICER1, EPCAM (Deletion/duplication testing only), GREM1 (promoter region deletion/duplication  testing only), KIT, MEN1, MLH1, MSH2, MSH3, MSH6, MUTYH, NBN, NF1, NHTL1, PALB2, PDGFRA, PMS2,  POLD1, POLE, PTEN, RAD50, RAD51C, RAD51D, SDHB, SDHC, SDHD, SMAD4, SMARCA4. STK11, TP53, TSC1, TSC2, and VHL.  The following genes were evaluated for sequence changes only: SDHA and HOXB13 c.251G>A variant only.    PLAN:  Ysabela start her radiation treatments today.  We discussed the possible toxicity side effects and complications of those treatments and also the fact that she will be started on capecitabine.  We reviewed how to take that medication and also the possible side effects toxicities and complications.  She thinks she may be able to go back to work 12 hours a week or so.  We will be glad to fill out papers accordingly.  She is walking 4 to 5 miles a day she says and that is excellent.  If she continues to do that she will tolerate the radiation much better  Once she completes the radiation treatments she will be started on anastrozole  She has already received to goserelin doses.  She should be starting to experience menopausal symptoms soon.  She knows to call for any other issues that may develop before the next visit.  Gordon Vandunk, [name], MD  11/16/17 12:33 PM Medical Oncology and Hematology [facility] [address] [place], [place] [address] Tel. [phone]    Fax. [phone]  Alice Rieger, am acting as scribe for Chauncey Cruel MD.  I, Lurline Del MD, have reviewed the above documentation for accuracy and completeness, and I agree with the above.

## 2017-11-13 NOTE — Telephone Encounter (Signed)
Patient dropped off FMLA paper work. 

## 2017-11-16 ENCOUNTER — Telehealth: Payer: Self-pay | Admitting: Oncology

## 2017-11-16 ENCOUNTER — Inpatient Hospital Stay: Payer: 59 | Attending: Adult Health

## 2017-11-16 ENCOUNTER — Inpatient Hospital Stay: Payer: 59

## 2017-11-16 ENCOUNTER — Ambulatory Visit
Admission: RE | Admit: 2017-11-16 | Discharge: 2017-11-16 | Disposition: A | Discharge status: Home / Self Care | Payer: 59 | Source: Ambulatory Visit | Attending: Radiation Oncology | Admitting: Radiation Oncology

## 2017-11-16 ENCOUNTER — Inpatient Hospital Stay: Payer: 59 | Admitting: Oncology

## 2017-11-16 VITALS — BP 137/84 | HR 98 | Temp 98.4°F | Resp 18 | Ht 67.0 in | Wt 273.2 lb

## 2017-11-16 DIAGNOSIS — Z9012 Acquired absence of left breast and nipple: Secondary | ICD-10-CM | POA: Diagnosis not present

## 2017-11-16 DIAGNOSIS — Z95828 Presence of other vascular implants and grafts: Secondary | ICD-10-CM

## 2017-11-16 DIAGNOSIS — Z5111 Encounter for antineoplastic chemotherapy: Secondary | ICD-10-CM | POA: Insufficient documentation

## 2017-11-16 DIAGNOSIS — C50812 Malignant neoplasm of overlapping sites of left female breast: Secondary | ICD-10-CM | POA: Diagnosis not present

## 2017-11-16 DIAGNOSIS — Z170 Estrogen receptor positive status [ER+]: Secondary | ICD-10-CM

## 2017-11-16 DIAGNOSIS — B373 Candidiasis of vulva and vagina: Secondary | ICD-10-CM | POA: Insufficient documentation

## 2017-11-16 DIAGNOSIS — C50912 Malignant neoplasm of unspecified site of left female breast: Secondary | ICD-10-CM

## 2017-11-16 DIAGNOSIS — Z510 Encounter for antineoplastic radiation therapy: Secondary | ICD-10-CM | POA: Diagnosis not present

## 2017-11-16 DIAGNOSIS — C773 Secondary and unspecified malignant neoplasm of axilla and upper limb lymph nodes: Secondary | ICD-10-CM

## 2017-11-16 DIAGNOSIS — C50312 Malignant neoplasm of lower-inner quadrant of left female breast: Secondary | ICD-10-CM | POA: Diagnosis not present

## 2017-11-16 DIAGNOSIS — N951 Menopausal and female climacteric states: Secondary | ICD-10-CM | POA: Insufficient documentation

## 2017-11-16 LAB — CBC WITH DIFFERENTIAL/PLATELET
BASOS ABS: 0.1 10*3/uL (ref 0.0–0.1)
BASOS PCT: 1 %
EOS PCT: 0 %
Eosinophils Absolute: 0 10*3/uL (ref 0.0–0.5)
HCT: 37.5 % (ref 34.8–46.6)
Hemoglobin: 12.1 g/dL (ref 11.6–15.9)
LYMPHS PCT: 19 %
Lymphs Abs: 1.3 10*3/uL (ref 0.9–3.3)
MCH: 26 pg (ref 25.1–34.0)
MCHC: 32.2 g/dL (ref 31.5–36.0)
MCV: 80.6 fL (ref 79.5–101.0)
MONO ABS: 0.5 10*3/uL (ref 0.1–0.9)
Monocytes Relative: 8 %
Neutro Abs: 4.9 10*3/uL (ref 1.5–6.5)
Neutrophils Relative %: 72 %
PLATELETS: 264 10*3/uL (ref 145–400)
RBC: 4.65 MIL/uL (ref 3.70–5.45)
RDW: 15.3 % — AB (ref 11.2–14.5)
WBC: 6.8 10*3/uL (ref 3.9–10.3)

## 2017-11-16 LAB — COMPREHENSIVE METABOLIC PANEL
ALK PHOS: 141 U/L (ref 40–150)
ALT: 23 U/L (ref 0–55)
AST: 16 U/L (ref 5–34)
Albumin: 3.9 g/dL (ref 3.5–5.0)
Anion gap: 7 (ref 3–11)
BILIRUBIN TOTAL: 0.3 mg/dL (ref 0.2–1.2)
BUN: 17 mg/dL (ref 7–26)
CALCIUM: 9.4 mg/dL (ref 8.4–10.4)
CO2: 25 mmol/L (ref 22–29)
Chloride: 104 mmol/L (ref 98–109)
Creatinine, Ser: 0.95 mg/dL (ref 0.60–1.10)
GFR calc Af Amer: >60 mL/min (ref >60)
GLUCOSE: 139 mg/dL (ref 70–140)
POTASSIUM: 3.7 mmol/L (ref 3.5–5.1)
Sodium: 136 mmol/L (ref 136–145)
TOTAL PROTEIN: 7.8 g/dL (ref 6.4–8.3)

## 2017-11-16 MED ORDER — GOSERELIN ACETATE 3.6 MG ~~LOC~~ IMPL
DRUG_IMPLANT | SUBCUTANEOUS | Status: AC
  Filled 2017-11-16: qty 3.6

## 2017-11-16 MED ORDER — GOSERELIN ACETATE 3.6 MG ~~LOC~~ IMPL
3.6000 mg | DRUG_IMPLANT | Freq: Once | SUBCUTANEOUS | Status: AC
  Administered 2017-11-16: 3.6 mg via SUBCUTANEOUS

## 2017-11-16 MED ORDER — RADIAPLEXRX EX GEL
Freq: Two times a day (BID) | CUTANEOUS | Status: DC
  Administered 2017-11-16: 14:00:00 via TOPICAL

## 2017-11-16 MED ORDER — ALRA NON-METALLIC DEODORANT (RAD-ONC)
1.0000 | Freq: Once | TOPICAL | Status: AC
Start: 2017-11-16 — End: 2017-11-16
  Administered 2017-11-16: 1 via TOPICAL

## 2017-11-16 NOTE — Telephone Encounter (Signed)
Gave patient avs and calendar of upcoming June appointments.  °

## 2017-11-17 ENCOUNTER — Ambulatory Visit
Admission: RE | Admit: 2017-11-17 | Discharge: 2017-11-17 | Disposition: A | Discharge status: Home / Self Care | Payer: 59 | Source: Ambulatory Visit | Attending: Radiation Oncology | Admitting: Radiation Oncology

## 2017-11-17 ENCOUNTER — Ambulatory Visit: Payer: 59 | Admitting: Physical Therapy

## 2017-11-17 DIAGNOSIS — Z9189 Other specified personal risk factors, not elsewhere classified: Secondary | ICD-10-CM | POA: Diagnosis not present

## 2017-11-17 DIAGNOSIS — M25612 Stiffness of left shoulder, not elsewhere classified: Secondary | ICD-10-CM | POA: Diagnosis not present

## 2017-11-17 DIAGNOSIS — Z170 Estrogen receptor positive status [ER+]: Secondary | ICD-10-CM | POA: Diagnosis not present

## 2017-11-17 DIAGNOSIS — C50812 Malignant neoplasm of overlapping sites of left female breast: Secondary | ICD-10-CM | POA: Diagnosis not present

## 2017-11-17 DIAGNOSIS — Z510 Encounter for antineoplastic radiation therapy: Secondary | ICD-10-CM | POA: Diagnosis not present

## 2017-11-17 DIAGNOSIS — C50312 Malignant neoplasm of lower-inner quadrant of left female breast: Secondary | ICD-10-CM | POA: Diagnosis not present

## 2017-11-17 DIAGNOSIS — Z483 Aftercare following surgery for neoplasm: Secondary | ICD-10-CM

## 2017-11-17 DIAGNOSIS — Z9012 Acquired absence of left breast and nipple: Secondary | ICD-10-CM | POA: Diagnosis not present

## 2017-11-17 NOTE — Therapy (Signed)
[place], [place], [address] Phone: [phone]   Fax:  [phone]  Physical Therapy Treatment  Patient Details  Name: VERLON CARCIONE MRN: [id number] Date of Birth: September 14, 1970 Referring Provider: Shona Simpson, PA-C   Encounter Date: 11/17/2017  PT End of Session - 11/17/17 0943    Visit Number  10    Number of Visits  17    Date for PT Re-Evaluation  12/18/17    PT Start Time  0850    PT Stop Time  0932    PT Time Calculation (min)  42 min    Activity Tolerance  Patient tolerated treatment well    Behavior During Therapy  [facility] for tasks assessed/performed       Past Medical History:  Diagnosis Date  . Anemia   . Breast cancer ([facility]) 01/14/2017   Left breast  . Diabetes mellitus    GDM    Past Surgical History:  Procedure Laterality Date  . BREAST RECONSTRUCTION WITH PLACEMENT OF TISSUE EXPANDER AND FLEX HD (ACELLULAR HYDRATED DERMIS) Left 08/27/2017   Procedure: LEFT BREAST RECONSTRUCTION WITH PLACEMENT OF TISSUE EXPANDER AND FLEX HD;  Surgeon: Wallace Going, DO;  Location: [facility];  Service: Plastics;  Laterality: Left;  . CESAREAN SECTION     x2  . MASTECTOMY MODIFIED RADICAL Left 08/27/2017  . MASTECTOMY MODIFIED RADICAL Left 08/27/2017   Procedure: LEFT MODIFIED RADICAL MASTECTOMY;  Surgeon: Erroll Luna, MD;  Location: [facility];  Service: General;  Laterality: Left;  . PORT-A-CATH REMOVAL Right 08/27/2017   Procedure: REMOVAL PORT-A-CATH RIGHT CHEST;  Surgeon: Erroll Luna, MD;  Location: [facility];  Service: General;  Laterality: Right;  . PORTACATH PLACEMENT Right 01/28/2017   Procedure: INSERTION PORT-A-CATH WITH ULTRASOUND;  Surgeon: Erroll Luna, MD;  Location: [facility];  Service: General;  Laterality: Right;  . TUBAL LIGATION      There were no vitals filed for this visit.  Subjective Assessment - 11/17/17 0851    Subjective  Had a little getaway this  past weekend. Started radiation yesterday and will go this afternoon.    Patient Stated Goals  get ROM, strength back; avoid lymphedema    Currently in Pain?  No/denies                       [facility] Adult PT Treatment/Exercise - 11/17/17 0001      Shoulder Exercises: Standing   Horizontal ABduction  Strengthening;Both;10 reps;Theraband red Theraband; abdominal engagement emphasized    External Rotation  AROM;Both;10 reps with scapular retraction    ABduction  AROM;Both;5 reps back against wall keeping arms back, "robber's pose"    Diagonals  Strengthening;Both;10 reps;Theraband red Theraband, 10 each with arm going up    Other Standing Exercises  3-way arm raises with 2 lb. weights on wrists x 10 each way in front of mirror      Shoulder Exercises: Pulleys   Flexion  2 minutes    Flexion Limitations  cueing not to hike shoulders    ABduction  2 minutes    ABduction Limitations  cues not to tilt trunk      Shoulder Exercises: Therapy Ball   Flexion  Both;10 reps 2 lb. weight on each wrist    Flexion Limitations  cueing for technique    ABduction  Left;10 reps 2 lb. weight on wrist    ABduction Limitations  cueing for technique with weights on  Manual Therapy   Myofascial Release  left UE myofascial pulling with movement into abduction    Passive ROM  in supine for left shoulder stetch into er, abduction, flexion, and positioning for radiation                  PT Long Term Goals - 11/11/17 1636      PT LONG TERM GOAL #1   Title  Pt. will be independent in HEP for shoulder ROM    Status  Achieved      PT LONG TERM GOAL #2   Title  Pt. will be able to get left arm into position for radiation treatment.    Status  Achieved      PT LONG TERM GOAL #3   Title  left shoulder flexion to 130 degrees    Baseline  right goes to this point at eval; left to 120 on 11/04/17; 127 on 11/09/17    Status  Partially Met      PT LONG TERM GOAL #4   Title  left  shoulder abduction to 140 degrees    Baseline  right goes to this point at eval; left to 90 on 11/04/17; 107 on 11/09/17    Status  Partially Met            Plan - 11/17/17 0914    Clinical Impression Statement  Again had more focus on strengthening today, adding some new exercises with Theraband. These are fatiguing for her. Pt. now says she may be going back to work part time, light duty soon.    Rehab Potential  Good    Clinical Impairments Affecting Rehab Potential  drain was removed 10/23/17    PT Frequency  2x / week    PT Duration  4 weeks    PT Treatment/Interventions  ADLs/Self Care Home Management;Therapeutic exercise;Patient/family education;Manual techniques;Manual lymph drainage;Compression bandaging;Scar mobilization;Passive range of motion    PT Next Visit Plan  Teach supine scapular series, though do in standing if possible. Continue other strengthening, stretching, and manual techniques.    PT Home Exercise Plan  standing dowel exercises with flexion and abduction, doorway stretch, hooklying with arms outstretched lower trunk rotation, supine with hands behind head, pull elbows back    Consulted and Agree with Plan of Care  Patient       Patient will benefit from skilled therapeutic intervention in order to improve the following deficits and impairments:  Decreased range of motion, Impaired UE functional use, Decreased strength, Other (comment)  Visit Diagnosis: Stiffness of left shoulder, not elsewhere classified  Aftercare following surgery for neoplasm     Problem List Patient Active Problem List   Diagnosis Date Noted  . Morbid obesity with body mass index (BMI) of 40.0 to 49.9 ([place]) 09/17/2017  . Genetic testing 03/25/2017  . Port catheter in place 02/10/2017  . Malignant 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, estrogen receptor positive ([place]) 01/23/2017  . 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

## 2017-11-18 ENCOUNTER — Ambulatory Visit
Admission: RE | Admit: 2017-11-18 | Discharge: 2017-11-18 | Disposition: A | Discharge status: Home / Self Care | Payer: 59 | Source: Ambulatory Visit | Attending: Radiation Oncology | Admitting: Radiation Oncology

## 2017-11-18 DIAGNOSIS — Z170 Estrogen receptor positive status [ER+]: Secondary | ICD-10-CM | POA: Diagnosis not present

## 2017-11-18 DIAGNOSIS — C50812 Malignant neoplasm of overlapping sites of left female breast: Secondary | ICD-10-CM | POA: Diagnosis not present

## 2017-11-18 DIAGNOSIS — Z9012 Acquired absence of left breast and nipple: Secondary | ICD-10-CM | POA: Diagnosis not present

## 2017-11-18 DIAGNOSIS — Z510 Encounter for antineoplastic radiation therapy: Secondary | ICD-10-CM | POA: Diagnosis not present

## 2017-11-18 DIAGNOSIS — C50312 Malignant neoplasm of lower-inner quadrant of left female breast: Secondary | ICD-10-CM | POA: Diagnosis not present

## 2017-11-19 ENCOUNTER — Encounter

## 2017-11-19 ENCOUNTER — Ambulatory Visit
Admission: RE | Admit: 2017-11-19 | Discharge: 2017-11-19 | Disposition: A | Discharge status: Home / Self Care | Payer: 59 | Source: Ambulatory Visit | Attending: Radiation Oncology | Admitting: Radiation Oncology

## 2017-11-19 DIAGNOSIS — Z9012 Acquired absence of left breast and nipple: Secondary | ICD-10-CM | POA: Diagnosis not present

## 2017-11-19 DIAGNOSIS — Z510 Encounter for antineoplastic radiation therapy: Secondary | ICD-10-CM | POA: Diagnosis not present

## 2017-11-19 DIAGNOSIS — C50312 Malignant neoplasm of lower-inner quadrant of left female breast: Secondary | ICD-10-CM | POA: Diagnosis not present

## 2017-11-19 DIAGNOSIS — Z170 Estrogen receptor positive status [ER+]: Secondary | ICD-10-CM | POA: Diagnosis not present

## 2017-11-19 DIAGNOSIS — C50812 Malignant neoplasm of overlapping sites of left female breast: Secondary | ICD-10-CM | POA: Diagnosis not present

## 2017-11-19 NOTE — Progress Notes (Signed)
FMLA successfully faxed to [employer] at [phone]. Mailed copy to patient address on file. 

## 2017-11-20 ENCOUNTER — Ambulatory Visit: Payer: 59

## 2017-11-20 ENCOUNTER — Ambulatory Visit: Payer: 59 | Admitting: Physical Therapy

## 2017-11-22 NOTE — Progress Notes (Signed)
Mackenzie [name]  [phone] [phone]     ID: Mackenzie [name] DOB: 11-24-1970  MR#: [id number]  VVZ#:[id number]  Patient Care Team: Willey Blade, MD as PCP - General (Internal Medicine) Magrinat, [name], MD as Consulting Physician (Oncology) Erroll Luna, MD as Consulting Physician (General Surgery) Kyung Rudd, MD as Consulting Physician (Radiation Oncology) Dillingham, Loel Lofty, DO as Attending Physician (Plastic Surgery) OTHER MD:  CHIEF COMPLAINT: Estrogen receptor positive breast cancer  CURRENT TREATMENT: Adjuvant radiation with capecitabine sensitization   HISTORY OF CURRENT ILLNESS: From the original intake note:  The patient herself noted some changes in her left breast late July 2018, she says, and eventually brought this to medical attention so that on 01/09/2017 she underwent bilateral diagnostic mammography with tomography and left breast ultrasonography at the breast Center. This found the breast density to be category C. In the upper inner quadrant of the left breast there was an area of asymmetry and there were malignant type calcifications involving all 4 quadrants. On exam there is firmness and palpable thickening in the anterior left breast with skin dimpling. Ultrasonography found at the 9:30 o'clock radiant 3 cm from the nipple a 2.7 cm mass and in the left axilla for abnormal lymph nodes largest of which measured 2.7 cm.  Biopsy of the left breast 9:30 o'clock mass 80 01/26/2017 showed ([id number]) invasive ductal carcinoma, with extracellular mucin, grade 1 or 2. In the lower outer left breast is separated biopsy the same day showed ductal carcinoma in situ. One of the 4 lymph nodes involved was positive for metastatic carcinoma. Prognostic panel on the invasive disease showed it to be estrogen receptor 100% positive, progesterone receptor 20% positive, both with strong staining intensity, with an MIB-1 of 20%, and no HER-2  amplification with a signals ratio 1.38 and the number per cell 2.90.  On 01/22/2017 the patient underwent bilateral breast MRI. This showed no involvement of the right breast, but in the left breast there was a masslike and non-masslike enhancement involving all quadrants, with skin swelling but no abnormal enhancement of the skin or nipple areolar complex or pectoralis muscle. The mass could not be clearly measured but spanned approximately 12 cm. There was bulky left axillary lymphadenopathy, with the largest lymph node measuring up to 3.4 cm.  The patient's subsequent history is as detailed below.  INTERVAL HISTORY: [name] returns today for evaluation and treatment of her estrogen receptor positive breast cancer. She is currently receiving adjuvant radiation treatments, which she is tolerating well so far. She was more active last week, and felt a little tired. She notes that her skin is doing well.   She continues on capecitabine. She tolerates this well. She takes 2 tablets BID.   She also receives goserelin every 4 weeks. She also tolerates this well. She has some hot flashes.    REVIEW OF SYSTEMS: [name] reports that she is doing well. She has some vaginal itching. She walks in the morning or exercise. She denies unusual headaches, visual changes, nausea, vomiting, or dizziness. There has been no unusual cough, phlegm production, or pleurisy. This been no change in bowel or bladder habits. She denies unexplained fatigue or unexplained weight loss, bleeding, rash, or fever. A detailed review of systems was otherwise stable.    PAST MEDICAL HISTORY: Past Medical History:  Diagnosis Date  . Anemia   . Breast cancer ([place]) 01/14/2017   Left breast  . Diabetes mellitus    GDM  PAST SURGICAL HISTORY: Past Surgical History:  Procedure Laterality Date  . BREAST RECONSTRUCTION WITH PLACEMENT OF TISSUE EXPANDER AND FLEX HD (ACELLULAR HYDRATED DERMIS) Left 08/27/2017   Procedure: LEFT  BREAST RECONSTRUCTION WITH PLACEMENT OF TISSUE EXPANDER AND FLEX HD;  Surgeon: Wallace Going, DO;  Location: [facility];  Service: Plastics;  Laterality: Left;  . CESAREAN SECTION     x2  . MASTECTOMY MODIFIED RADICAL Left 08/27/2017  . MASTECTOMY MODIFIED RADICAL Left 08/27/2017   Procedure: LEFT MODIFIED RADICAL MASTECTOMY;  Surgeon: Erroll Luna, MD;  Location: [facility];  Service: General;  Laterality: Left;  . PORT-A-CATH REMOVAL Right 08/27/2017   Procedure: REMOVAL PORT-A-CATH RIGHT CHEST;  Surgeon: Erroll Luna, MD;  Location: [facility];  Service: General;  Laterality: Right;  . PORTACATH PLACEMENT Right 01/28/2017   Procedure: INSERTION PORT-A-CATH WITH ULTRASOUND;  Surgeon: Erroll Luna, MD;  Location: [facility];  Service: General;  Laterality: Right;  . TUBAL LIGATION      FAMILY HISTORY Family History  Problem Relation Age of Onset  . Breast cancer Paternal Grandmother 27       d.60s from breast cancer. Did not have treatment.  . Other Mother        X.54 from complications of surgery to remove brain tumor  The patient's father still alive at age 55. The patient's mother died with a brain tumor which the patient says was "benign". She was 56. The patient has one brother, no sisters. The only breast cancer in the family is a paternal grandmother who died from breast cancer at an unknown age. There is no history of ovarian or prostate cancer in the family.  GYNECOLOGIC HISTORY:  No LMP recorded. (Menstrual status: Chemotherapy). Menarche age 64 and first live birth age 55 she is [place] P4. The person is still having regular periods as of August 2018  SOCIAL HISTORY: (As of August 2018) Thu works at [employer] on the fifth floor, [place]. Her husband, Mackenzie [name], is a Building control surveyor. Daughter and married and lives in [place] and works as a Education administrator. Daughter, Mackenzie [name] lives in [place] and works in the police department. Daughters, Mackenzie [name] and Mackenzie [name] are 72 and  6, living at home. The patient has no grandchildren. She attends a Tour manager   ADVANCED DIRECTIVES: Not in place   HEALTH MAINTENANCE: Social History   Tobacco Use  . Smoking status: Never Smoker  . Smokeless tobacco: Never Used  Substance Use Topics  . Alcohol use: Yes    Comment: social  . Drug use: No     Colonoscopy: Never  PAP: December 2017  Bone density: Never   No Known Allergies  Current Outpatient Medications  Medication Sig Dispense Refill  . acetaminophen (TYLENOL) 500 MG tablet Take 1,000 mg by mouth every 6 (six) hours as needed (for pain/headaches.).    Marland Kitchen B Complex Vitamins (VITAMIN B COMPLEX PO) Take 1 capsule by mouth 2 (two) times daily.     . benzonatate (TESSALON) 100 MG capsule Take 100 mg by mouth every 8 (eight) hours as needed for cough.   0  . Calcium Carb-Cholecalciferol (CALCIUM 600+D3 PO) Take 1 tablet by mouth 2 (two) times daily.    . capecitabine (XELODA) 500 MG tablet Take 2 tablets (1012m) by mouth twice daily, within 30 minutes after meals. Take on days of radiation only, M-F (Patient not taking: Reported on 10/06/2017) 140 tablet 0  . cetirizine (ZYRTEC) 10 MG tablet Take 10 mg by mouth daily.      .Marland Kitchen  Cholecalciferol (VITAMIN D-3) 5000 units TABS Take 5,000 Units by mouth 2 (two) times daily.    Marland Kitchen ibuprofen (ADVIL,MOTRIN) 200 MG tablet Take 800 mg by mouth every 8 (eight) hours as needed (for pain.).    Marland Kitchen loratadine (CLARITIN) 10 MG tablet Take 1 tablet (10 mg total) by mouth daily. 90 tablet 2  . montelukast (SINGULAIR) 10 MG tablet Take 10 mg by mouth daily as needed (for allergies.).     Marland Kitchen Multiple Vitamin (MULTIVITAMIN WITH MINERALS) TABS tablet Take 1 tablet by mouth daily.    . Omega-3 Fatty Acids (FISH OIL) 1000 MG CAPS Take 2,000 mg by mouth daily.     Marland Kitchen PREVIDENT 5000 BOOSTER PLUS 1.1 % PSTE BRUSH ONCE A DAY IN PLACE OF REGULAR TOOTHPASTE  2  . prochlorperazine (COMPAZINE) 10 MG tablet Take 10 mg by mouth every 6 (six)  hours as needed for nausea or vomiting.   1  . senna (SENOKOT) 8.6 MG TABS tablet Take 1 tablet (8.6 mg total) by mouth 2 (two) times daily. 120 each 0   No current facility-administered medications for this visit.    Facility-Administered Medications Ordered in Other Visits  Medication Dose Route Frequency Provider Last Rate Last Dose  . sodium chloride flush (NS) 0.9 % injection 10 mL  10 mL Intravenous PRN Magrinat, [name], MD   10 mL at 03/10/17 1239    OBJECTIVE: Middle-aged African-American woman in no acute distress  Vitals:   11/23/17 1541  BP: (!) 151/75  Pulse: 87  Resp: 18  Temp: 98.1 F (36.7 C)  SpO2: 99%     Body mass index is 43.42 kg/m.   Wt Readings from Last 3 Encounters:  11/23/17 277 lb 3.2 oz (125.7 kg)  11/16/17 273 lb 3.2 oz (123.9 kg)  10/19/17 278 lb (126.1 kg)   ECOG FS: 1 - Symptomatic but completely ambulatory   Sclerae unicteric, pupils round and equal Oropharynx clear and moist No cervical or supraclavicular adenopathy Lungs no rales or rhonchi Heart regular rate and rhythm Abd soft, nontender, positive bowel sounds MSK no focal spinal tenderness, no upper extremity lymphedema Neuro: nonfocal, well oriented, appropriate affect Breasts: The right breast is benign.  The left breast has undergone mastectomy.  Expander is in place.  It is currently receiving radiation.  There is no evidence of recurrent disease.  Both axillae are benign.  LAB RESULTS:    No visits with results within 3 Day(s) from this visit.  Latest known visit with results is:  Appointment on 11/16/2017  Component Date Value Ref Range Status  . Sodium 11/16/2017 136  136 - 145 mmol/L Final  . Potassium 11/16/2017 3.7  3.5 - 5.1 mmol/L Final  . Chloride 11/16/2017 104  98 - 109 mmol/L Final  . CO2 11/16/2017 25  22 - 29 mmol/L Final  . Glucose, Bld 11/16/2017 139  70 - 140 mg/dL Final  . BUN 11/16/2017 17  7 - 26 mg/dL Final  . Creatinine, Ser 11/16/2017 0.95  0.60 -  1.10 mg/dL Final  . Calcium 11/16/2017 9.4  8.4 - 10.4 mg/dL Final  . Total Protein 11/16/2017 7.8  6.4 - 8.3 g/dL Final  . Albumin 11/16/2017 3.9  3.5 - 5.0 g/dL Final  . AST 11/16/2017 16  5 - 34 U/L Final  . ALT 11/16/2017 23  0 - 55 U/L Final  . Alkaline Phosphatase 11/16/2017 141  40 - 150 U/L Final  . Total Bilirubin 11/16/2017 0.3  0.2 -  1.2 mg/dL Final  . GFR calc non Af Amer 11/16/2017 >60  >60 mL/min Final  . GFR calc Af Amer 11/16/2017 >60  >60 mL/min Final   Comment: (NOTE) The eGFR has been calculated using the CKD EPI equation. This calculation has not been validated in all clinical situations. eGFR's persistently <60 mL/min signify possible Chronic Kidney Disease.   Georgiann Hahn gap 11/16/2017 7  3 - 11 Final   Performed at [facility], [place] [address] [place], [place], [place] [address]  . WBC 11/16/2017 6.8  3.9 - 10.3 K/uL Final  . RBC 11/16/2017 4.65  3.70 - 5.45 MIL/uL Final  . Hemoglobin 11/16/2017 12.1  11.6 - 15.9 g/dL Final  . HCT 11/16/2017 37.5  34.8 - 46.6 % Final  . MCV 11/16/2017 80.6  79.5 - 101.0 fL Final  . MCH 11/16/2017 26.0  25.1 - 34.0 pg Final  . MCHC 11/16/2017 32.2  31.5 - 36.0 g/dL Final  . RDW 11/16/2017 15.3* 11.2 - 14.5 % Final  . Platelets 11/16/2017 264  145 - 400 K/uL Final  . Neutrophils Relative % 11/16/2017 72  % Final  . Neutro Abs 11/16/2017 4.9  1.5 - 6.5 K/uL Final  . Lymphocytes Relative 11/16/2017 19  % Final  . Lymphs Abs 11/16/2017 1.3  0.9 - 3.3 K/uL Final  . Monocytes Relative 11/16/2017 8  % Final  . Monocytes Absolute 11/16/2017 0.5  0.1 - 0.9 K/uL Final  . Eosinophils Relative 11/16/2017 0  % Final  . Eosinophils Absolute 11/16/2017 0.0  0.0 - 0.5 K/uL Final  . Basophils Relative 11/16/2017 1  % Final  . Basophils Absolute 11/16/2017 0.1  0.0 - 0.1 K/uL Final   Performed at [facility], [place] [address] [place], [name] [address]       STUDIES: No results found.   ELIGIBLE  FOR AVAILABLE RESEARCH PROTOCOL: no  ASSESSMENT: 47 y.o. [place] woman status post left breast upper inner quadrant biopsy 01/14/2017 for a clinical T3 N2, stage IIA invasive ductal carcinoma, grade 1 or 2, estrogen and progesterone receptor positive, HER-2 nonamplified, with an MIB-1 of 20%.  (1) staging studies: Brain MRI, bone scan, and CT scan of the chest 02/05/2017 showed no brain lesions, no lung or liver lesions, a 4.9 cm mass in the left breast with left axillary and subpectoral adenopathy, and nonspecific bone scan tracer at L2, left scapula, and anterior ribs, with lumbar spine MRI suggested for further evaluation.  (a) lumbar spine MRI 02/17/2017 showed no normal bone lesions.  There was mild lumbar spondylosis  (2) neoadjuvant chemotherapy consisting of cyclophosphamide and doxorubicin in dose dense fashion 4 started 02/10/2017, completed 03/24/2017, followed by weekly carboplatin and gemcitabine given days 1 and 8 of each 21-day cycle starting 04/14/2017, completing the planned 4 cycles 06/26/2017  (3) is post left modified radical mastectomy on 08/27/2017 showing an mpT3 pN2 residual invasive ductal carcinoma, grade 2, with a residual cancer burden of 3.  Margins were clear  (a) TRAM flap reconstruction planned  (4) postmastectomy radiation to finish 01/01/2018  (a) capecitabine radiosensitization started 11/16/2017  (5) goserelin started 09/21/2017  (a) letrozole to start at the completion of radiation  (6) genetics testing 03/24/2017 through the Common Hereditary Cancer Panel offered by [facility] found no deleterious mutations in APC, ATM, AXIN2, BARD1, BMPR1A, BRCA1, BRCA2, BRIP1, CDH1, CDKN2A (p14ARF), CDKN2A (p16INK4a), CHEK2, CTNNA1, DICER1, EPCAM (Deletion/duplication testing only), GREM1 (promoter region deletion/duplication testing only), KIT, MEN1, MLH1, MSH2, MSH3,  MSH6, MUTYH, NBN, NF1, NHTL1, PALB2, PDGFRA, PMS2, POLD1, POLE, PTEN, RAD50, RAD51C, RAD51D, SDHB, SDHC,  SDHD, SMAD4, SMARCA4. STK11, TP53, TSC1, TSC2, and VHL.  The following genes were evaluated for sequence changes only: SDHA and HOXB13 c.251G>A variant only.    PLAN:  Cierah is tolerating her radiation well so far.  She has a good understanding of how to take her capecitabine and so far has had no side effects from that radiosensitizer.  She is also doing well on her monthly goserelin injections.  I am calling in a Monistat suppository for her yeast infection.  She will let me know if that is not effective.  Otherwise we are going to see her with each 1 of her go Serlin doses until she completes her radiation.  At that point she will start on letrozole  She knows to call for any other issues that may develop before the next visit.   Magrinat, [name], MD  11/23/17 3:54 PM [facility] [address] [place], [place] [address] Tel. [phone]    Fax. [phone]  Alice Rieger, am acting as scribe for Chauncey Cruel MD.  I, Lurline Del MD, have reviewed the above documentation for accuracy and completeness, and I agree with the above.

## 2017-11-23 ENCOUNTER — Inpatient Hospital Stay (HOSPITAL_BASED_OUTPATIENT_CLINIC_OR_DEPARTMENT_OTHER): Payer: 59 | Admitting: Oncology

## 2017-11-23 ENCOUNTER — Ambulatory Visit
Admission: RE | Admit: 2017-11-23 | Discharge: 2017-11-23 | Disposition: A | Discharge status: Home / Self Care | Payer: 59 | Source: Ambulatory Visit | Attending: Radiation Oncology | Admitting: Radiation Oncology

## 2017-11-23 VITALS — BP 151/75 | HR 87 | Temp 98.1°F | Resp 18 | Ht 67.0 in | Wt 277.2 lb

## 2017-11-23 DIAGNOSIS — Z170 Estrogen receptor positive status [ER+]: Secondary | ICD-10-CM

## 2017-11-23 DIAGNOSIS — N951 Menopausal and female climacteric states: Secondary | ICD-10-CM | POA: Diagnosis not present

## 2017-11-23 DIAGNOSIS — C50312 Malignant neoplasm of lower-inner quadrant of left female breast: Secondary | ICD-10-CM | POA: Diagnosis not present

## 2017-11-23 DIAGNOSIS — C773 Secondary and unspecified malignant neoplasm of axilla and upper limb lymph nodes: Secondary | ICD-10-CM | POA: Diagnosis not present

## 2017-11-23 DIAGNOSIS — Z9012 Acquired absence of left breast and nipple: Secondary | ICD-10-CM | POA: Diagnosis not present

## 2017-11-23 DIAGNOSIS — B373 Candidiasis of vulva and vagina: Secondary | ICD-10-CM | POA: Diagnosis not present

## 2017-11-23 DIAGNOSIS — C50812 Malignant neoplasm of overlapping sites of left female breast: Secondary | ICD-10-CM | POA: Diagnosis not present

## 2017-11-23 DIAGNOSIS — Z5111 Encounter for antineoplastic chemotherapy: Secondary | ICD-10-CM | POA: Diagnosis not present

## 2017-11-23 DIAGNOSIS — Z510 Encounter for antineoplastic radiation therapy: Secondary | ICD-10-CM | POA: Diagnosis not present

## 2017-11-23 MED ORDER — MICONAZOLE NITRATE 1200 & 2 MG & % VA KIT: 1.0000 | PACK | Freq: Once | VAGINAL | 0 refills | Status: AC

## 2017-11-24 ENCOUNTER — Ambulatory Visit: Payer: 59

## 2017-11-24 ENCOUNTER — Ambulatory Visit
Admission: RE | Admit: 2017-11-24 | Discharge: 2017-11-24 | Disposition: A | Discharge status: Home / Self Care | Payer: 59 | Source: Ambulatory Visit | Attending: Radiation Oncology | Admitting: Radiation Oncology

## 2017-11-24 DIAGNOSIS — C50312 Malignant neoplasm of lower-inner quadrant of left female breast: Secondary | ICD-10-CM | POA: Diagnosis not present

## 2017-11-24 DIAGNOSIS — Z9189 Other specified personal risk factors, not elsewhere classified: Secondary | ICD-10-CM | POA: Diagnosis not present

## 2017-11-24 DIAGNOSIS — Z483 Aftercare following surgery for neoplasm: Secondary | ICD-10-CM

## 2017-11-24 DIAGNOSIS — C50812 Malignant neoplasm of overlapping sites of left female breast: Secondary | ICD-10-CM | POA: Diagnosis not present

## 2017-11-24 DIAGNOSIS — Z510 Encounter for antineoplastic radiation therapy: Secondary | ICD-10-CM | POA: Diagnosis not present

## 2017-11-24 DIAGNOSIS — M25612 Stiffness of left shoulder, not elsewhere classified: Secondary | ICD-10-CM | POA: Diagnosis not present

## 2017-11-24 DIAGNOSIS — Z9012 Acquired absence of left breast and nipple: Secondary | ICD-10-CM | POA: Diagnosis not present

## 2017-11-24 DIAGNOSIS — Z170 Estrogen receptor positive status [ER+]: Secondary | ICD-10-CM | POA: Diagnosis not present

## 2017-11-24 NOTE — Patient Instructions (Signed)
Can do these in standing either in front of mirror to watch technique, or with back, shoulders, and head against wall with tummy tight.         Cancer Rehab [phone] Over Head Pull: Narrow and Wide Grip      On back, knees bent, feet flat, band across thighs, elbows straight but relaxed. Pull hands apart (start). Keeping elbows straight, bring arms up and over head, hands toward floor. Keep pull steady on band. Hold momentarily. Return slowly, keeping pull steady, back to start. Then do same with a wider grip on the band (past shoulder width) Repeat _5-10__ times. Band color __red____   Side Pull: Double Arm   On back, knees bent, feet flat. Arms perpendicular to body, shoulder level, elbows straight but relaxed. Pull arms out to sides, elbows straight. Resistance band comes across collarbones, hands toward floor. Hold momentarily. Slowly return to starting position. Repeat _5-10__ times. Band color _red____   Sword   On back, knees bent, feet flat, left hand on left hip, right hand above left. Pull right arm DIAGONALLY (hip to shoulder) across chest. Bring right arm along head toward floor. Hold momentarily. Slowly return to starting position. Repeat _5-10__ times. Do with left arm. Band color _red_____   Shoulder Rotation: Double Arm   On back, knees bent, feet flat, elbows tucked at sides, bent 90, hands palms up. Pull hands apart and down toward floor, keeping elbows near sides. Hold momentarily. Slowly return to starting position. Repeat _5-10__ times. Band color __red____

## 2017-11-24 NOTE — Therapy (Signed)
[place], [place], [address] Phone: [phone]   Fax:  [phone]  Physical Therapy Treatment  Patient Details  Name: Mackenzie [name] MRN: [id number] Date of Birth: 12/18/1970 Referring Provider: Shona Simpson, PA-C   Encounter Date: 11/24/2017  PT End of Session - 11/24/17 1020    Visit Number  11    Number of Visits  17    Date for PT Re-Evaluation  12/18/17    PT Start Time  0939 Pt arrived late    PT Stop Time  1020    PT Time Calculation (min)  41 min    Activity Tolerance  Patient tolerated treatment well    Behavior During Therapy  [facility] for tasks assessed/performed       Past Medical History:  Diagnosis Date  . Anemia   . Breast cancer ([place]) 01/14/2017   Left breast  . Diabetes mellitus    GDM    Past Surgical History:  Procedure Laterality Date  . BREAST RECONSTRUCTION WITH PLACEMENT OF TISSUE EXPANDER AND FLEX HD (ACELLULAR HYDRATED DERMIS) Left 08/27/2017   Procedure: LEFT BREAST RECONSTRUCTION WITH PLACEMENT OF TISSUE EXPANDER AND FLEX HD;  Surgeon: Wallace Going, DO;  Location: [facility];  Service: Plastics;  Laterality: Left;  . CESAREAN SECTION     x2  . MASTECTOMY MODIFIED RADICAL Left 08/27/2017  . MASTECTOMY MODIFIED RADICAL Left 08/27/2017   Procedure: LEFT MODIFIED RADICAL MASTECTOMY;  Surgeon: Erroll Luna, MD;  Location: [facility];  Service: General;  Laterality: Left;  . PORT-A-CATH REMOVAL Right 08/27/2017   Procedure: REMOVAL PORT-A-CATH RIGHT CHEST;  Surgeon: Erroll Luna, MD;  Location: [facility];  Service: General;  Laterality: Right;  . PORTACATH PLACEMENT Right 01/28/2017   Procedure: INSERTION PORT-A-CATH WITH ULTRASOUND;  Surgeon: Erroll Luna, MD;  Location: [facility];  Service: General;  Laterality: Right;  . TUBAL LIGATION      There were no vitals filed for this visit.  Subjective Assessment - 11/24/17 0944    Subjective  I felt  good after last visit, the new exercises were good. I forgot to bring the band back though.     Pertinent History  Left breast cancer diagnosed 01/10/17 by mammogram and needle biopsy. Then had neo-adjuvant chemo starting 02/10/17 until 06/26/17; had left mastectomy 08/27/17 with ALND and immediate expander placed. Has had 6 fills. Still has her drain and Dr. Marla Roe is monitoring that. Will have radiation for 33 visits but it's not scheduled yet. Will have lat flap surgery after radiation. Allergies seasonal and all the time.    Patient Stated Goals  get ROM, strength back; avoid lymphedema    Currently in Pain?  No/denies                       [facility] Adult PT Treatment/Exercise - 11/24/17 0001      Shoulder Exercises: Standing   Horizontal ABduction  Strengthening;Both;10 reps;Theraband Erect posture with core engaged    Theraband Level (Shoulder Horizontal ABduction)  Level 2 (Red)    External Rotation  Strengthening;Both;10 reps;Theraband Erect posture with core engaged and scap retract at end ROM    Theraband Level (Shoulder External Rotation)  Level 2 (Red)    Flexion  Strengthening;Both;10 reps;Theraband;Limitations Narrow and Wide Grip; erect posture with core engaged    Theraband Level (Shoulder Flexion)  Level 2 (Red)    Flexion Limitations  Demonstration and VCs for correct technique  Diagonals  Strengthening;Right;Left;10 reps;Theraband Erect posture against wall with core engaged     Theraband Level (Shoulder Diagonals)  Level 2 (Red)    Other Standing Exercises  3-way arm raises with 2 lb. weights on wrists x 10 each way with back against wall/core engaged, and shoulders and head against wall (pt needed therapist demonstration for correct techniqure).      Shoulder Exercises: Pulleys   Flexion  2 minutes    Flexion Limitations  VCs to relax Lt shoulder    ABduction  2 minutes    ABduction Limitations  VCs to relax Lt shoulder      Shoulder Exercises: Therapy Ball    Flexion  Both;10 reps 2 lb on each wrist; forward lean into end of stretch    Flexion Limitations  VCs to remind pt of technique    ABduction  Left;10 reps 2 lbs on wrist; same side lean into end of stretch      Manual Therapy   Myofascial Release  left UE myofascial pulling with movement into abduction    Passive ROM  in supine for left shoulder stetch into er, abduction, flexion, and positioning for radiation             PT Education - 11/24/17 0952    Education provided  Yes    Education Details  Standing scapular series with red theraband    Person(s) Educated  Patient    Methods  Explanation;Demonstration;Handout    Comprehension  Verbalized understanding;Returned demonstration;Verbal cues required          PT Long Term Goals - 11/11/17 1636      PT LONG TERM GOAL #1   Title  Pt. will be independent in HEP for shoulder ROM    Status  Achieved      PT LONG TERM GOAL #2   Title  Pt. will be able to get left arm into position for radiation treatment.    Status  Achieved      PT LONG TERM GOAL #3   Title  left shoulder flexion to 130 degrees    Baseline  right goes to this point at eval; left to 120 on 11/04/17; 127 on 11/09/17    Status  Partially Met      PT LONG TERM GOAL #4   Title  left shoulder abduction to 140 degrees    Baseline  right goes to this point at eval; left to 90 on 11/04/17; 107 on 11/09/17    Status  Partially Met            Plan - 11/24/17 1221    Clinical Impression Statement  Continued with focus on strengthening and issued scapular series to HEP with red theraband and in standing (did these against the wall today). Pt was challenged with these requiring rest breaks throughout exercising. Continued with end ROM stretching briefly at end of session as time allowed.     Rehab Potential  Good    Clinical Impairments Affecting Rehab Potential  drain was removed 10/23/17    PT Frequency  2x / week    PT Duration  4 weeks    PT  Treatment/Interventions  ADLs/Self Care Home Management;Therapeutic exercise;Patient/family education;Manual techniques;Manual lymph drainage;Compression bandaging;Scar mobilization;Passive range of motion    PT Next Visit Plan  Review scapular series in standing prn. Continue other strengthening, stretching, and manual techniques focusing on end ROM.    Consulted and Agree with Plan of Care  Patient  Patient will benefit from skilled therapeutic intervention in order to improve the following deficits and impairments:  Decreased range of motion, Impaired UE functional use, Decreased strength, Other (comment)  Visit Diagnosis: Stiffness of left shoulder, not elsewhere classified  Aftercare following surgery for neoplasm  At risk for lymphedema     Problem List Patient Active Problem List   Diagnosis Date Noted  . Morbid obesity with body mass index (BMI) of 40.0 to 49.9 ([place]) 09/17/2017  . Genetic testing 03/25/2017  . Port catheter in place 02/10/2017  . Malignant 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, estrogen receptor positive ([place]) 01/23/2017  . 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

## 2017-11-25 ENCOUNTER — Ambulatory Visit
Admission: RE | Admit: 2017-11-25 | Discharge: 2017-11-25 | Disposition: A | Discharge status: Home / Self Care | Payer: 59 | Source: Ambulatory Visit | Attending: Radiation Oncology | Admitting: Radiation Oncology

## 2017-11-25 DIAGNOSIS — Z510 Encounter for antineoplastic radiation therapy: Secondary | ICD-10-CM | POA: Diagnosis not present

## 2017-11-25 DIAGNOSIS — Z170 Estrogen receptor positive status [ER+]: Secondary | ICD-10-CM | POA: Diagnosis not present

## 2017-11-25 DIAGNOSIS — C50812 Malignant neoplasm of overlapping sites of left female breast: Secondary | ICD-10-CM | POA: Diagnosis not present

## 2017-11-25 DIAGNOSIS — C50312 Malignant neoplasm of lower-inner quadrant of left female breast: Secondary | ICD-10-CM | POA: Diagnosis not present

## 2017-11-25 DIAGNOSIS — Z9012 Acquired absence of left breast and nipple: Secondary | ICD-10-CM | POA: Diagnosis not present

## 2017-11-26 ENCOUNTER — Ambulatory Visit
Admission: RE | Admit: 2017-11-26 | Discharge: 2017-11-26 | Disposition: A | Discharge status: Home / Self Care | Payer: 59 | Source: Ambulatory Visit | Attending: Radiation Oncology | Admitting: Radiation Oncology

## 2017-11-26 ENCOUNTER — Ambulatory Visit: Payer: 59

## 2017-11-26 DIAGNOSIS — Z483 Aftercare following surgery for neoplasm: Secondary | ICD-10-CM | POA: Diagnosis not present

## 2017-11-26 DIAGNOSIS — Z9189 Other specified personal risk factors, not elsewhere classified: Secondary | ICD-10-CM

## 2017-11-26 DIAGNOSIS — M25612 Stiffness of left shoulder, not elsewhere classified: Secondary | ICD-10-CM

## 2017-11-26 DIAGNOSIS — Z510 Encounter for antineoplastic radiation therapy: Secondary | ICD-10-CM | POA: Diagnosis not present

## 2017-11-26 DIAGNOSIS — C50812 Malignant neoplasm of overlapping sites of left female breast: Secondary | ICD-10-CM | POA: Diagnosis not present

## 2017-11-26 DIAGNOSIS — C50912 Malignant neoplasm of unspecified site of left female breast: Secondary | ICD-10-CM | POA: Diagnosis not present

## 2017-11-26 DIAGNOSIS — Z170 Estrogen receptor positive status [ER+]: Secondary | ICD-10-CM | POA: Diagnosis not present

## 2017-11-26 DIAGNOSIS — C50312 Malignant neoplasm of lower-inner quadrant of left female breast: Secondary | ICD-10-CM | POA: Diagnosis not present

## 2017-11-26 DIAGNOSIS — Z9012 Acquired absence of left breast and nipple: Secondary | ICD-10-CM | POA: Diagnosis not present

## 2017-11-26 NOTE — Therapy (Signed)
[place]ack[name], [place], [address] Phone: [phone]   Fax:  [phone]  Physical Therapy Treatment  Patient Details  Name: Mackenzie [name] MRN: [id number] Date of Birth: [birth year] Referring Provider: Shona Simpson, PA-C   Encounter Date: 11/26/2017  PT End of Session - 11/26/17 0957    Visit Number  12    Number of Visits  17    Date for PT Re-Evaluation  12/18/17    PT Start Time  7782 pt arrive late    PT Stop Time  1017    PT Time Calculation (min)  33 min    Activity Tolerance  Patient tolerated treatment well    Behavior During Therapy  [name] for tasks assessed/performed       Past Medical History:  Diagnosis Date  . Anemia   . Breast cancer ([place]) 01/14/2017   Left breast  . Diabetes mellitus    GDM    Past Surgical History:  Procedure Laterality Date  . BREAST RECONSTRUCTION WITH PLACEMENT OF TISSUE EXPANDER AND FLEX HD (ACELLULAR HYDRATED DERMIS) Left 08/27/2017   Procedure: LEFT BREAST RECONSTRUCTION WITH PLACEMENT OF TISSUE EXPANDER AND FLEX HD;  Surgeon: Wallace Going, DO;  Location: [place];  Service: Plastics;  Laterality: Left;  . CESAREAN SECTION     x2  . MASTECTOMY MODIFIED RADICAL Left 08/27/2017  . MASTECTOMY MODIFIED RADICAL Left 08/27/2017   Procedure: LEFT MODIFIED RADICAL MASTECTOMY;  Surgeon: Erroll Luna, MD;  Location: [place];  Service: General;  Laterality: Left;  . PORT-A-CATH REMOVAL Right 08/27/2017   Procedure: REMOVAL PORT-A-CATH RIGHT CHEST;  Surgeon: Erroll Luna, MD;  Location: [place];  Service: General;  Laterality: Right;  . PORTACATH PLACEMENT Right 01/28/2017   Procedure: INSERTION PORT-A-CATH WITH ULTRASOUND;  Surgeon: Erroll Luna, MD;  Location: [place];  Service: General;  Laterality: Right;  . TUBAL LIGATION      There were no vitals filed for this visit.  Subjective Assessment - 11/26/17 0946    Subjective  I could  tell we worked hard last visit, I felt good after.     Pertinent History  Left breast cancer diagnosed 01/10/17 by mammogram and needle biopsy. Then had neo-adjuvant chemo starting 02/10/17 until 06/26/17; had left mastectomy 08/27/17 with ALND and immediate expander placed. Has had 6 fills. Still has her drain and Dr. Marla Roe is monitoring that. Will have radiation for 33 visits but it's not scheduled yet. Will have lat flap surgery after radiation. Allergies seasonal and all the time.    Patient Stated Goals  get ROM, strength back; avoid lymphedema    Currently in Pain?  No/denies         [facility] PT Assessment - 11/26/17 0001      AROM   Left Shoulder Flexion  133 Degrees pt with better scapular control    Left Shoulder ABduction  136 Degrees pt with better scapular control                   [facility] Adult PT Treatment/Exercise - 11/26/17 0001      Shoulder Exercises: Standing   Other Standing Exercises  3-way arm raises with 2 lb. weights on wrists 2 x 10 each way with back against wall/core engaged, and shoulders and head against wall (brief therapist demonstration to remind pt of correct techniqure).      Shoulder Exercises: Pulleys   Flexion  2 minutes    Flexion Limitations  VCs  to relax Lt shoulder    ABduction  2 minutes    ABduction Limitations  VCs to relax Lt shoulder with demonstration      Manual Therapy   Myofascial Release  left UE myofascial pulling with movement into abduction    Passive ROM  in supine for left shoulder stetch into D2, abduction, flexion, and positioning for radiation                  PT Long Term Goals - 11/11/17 1636      PT LONG TERM GOAL #1   Title  Pt. will be independent in HEP for shoulder ROM    Status  Achieved      PT LONG TERM GOAL #2   Title  Pt. will be able to get left arm into position for radiation treatment.    Status  Achieved      PT LONG TERM GOAL #3   Title  left shoulder flexion to 130 degrees     Baseline  right goes to this point at eval; left to 120 on 11/04/17; 127 on 11/09/17    Status  Partially Met      PT LONG TERM GOAL #4   Title  left shoulder abduction to 140 degrees    Baseline  right goes to this point at eval; left to 90 on 11/04/17; 107 on 11/09/17    Status  Partially Met            Plan - 11/26/17 0958    Clinical Impression Statement  Pt arrived late today so shortened strengthening to make sure to have time to focus on end ROM stretching to help pt maintain ROM as she is undergoing radiation.     Rehab Potential  Good    Clinical Impairments Affecting Rehab Potential  drain was removed 10/23/17; started radiation 11/16/17    PT Frequency  2x / week    PT Duration  4 weeks    PT Treatment/Interventions  ADLs/Self Care Home Management;Therapeutic exercise;Patient/family education;Manual techniques;Manual lymph drainage;Compression bandaging;Scar mobilization;Passive range of motion    PT Next Visit Plan  Review scapular series in standing prn. Continue other strengthening, stretching, and manual techniques focusing on end ROM.    Consulted and Agree with Plan of Care  Patient       Patient will benefit from skilled therapeutic intervention in order to improve the following deficits and impairments:  Decreased range of motion, Impaired UE functional use, Decreased strength, Other (comment)  Visit Diagnosis: Stiffness of left shoulder, not elsewhere classified  Aftercare following surgery for neoplasm  At risk for lymphedema     Problem List Patient Active Problem List   Diagnosis Date Noted  . Morbid obesity with body mass index (BMI) of 40.0 to 49.9 ([place]) 09/17/2017  . Genetic testing 03/25/2017  . Port catheter in place 02/10/2017  . Malignant 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, estrogen receptor positive ([place]) 01/23/2017  . 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

## 2017-11-27 ENCOUNTER — Ambulatory Visit
Admission: RE | Admit: 2017-11-27 | Discharge: 2017-11-27 | Disposition: A | Discharge status: Home / Self Care | Payer: 59 | Source: Ambulatory Visit | Attending: Radiation Oncology | Admitting: Radiation Oncology

## 2017-11-27 DIAGNOSIS — C50312 Malignant neoplasm of lower-inner quadrant of left female breast: Secondary | ICD-10-CM | POA: Diagnosis not present

## 2017-11-27 DIAGNOSIS — Z9012 Acquired absence of left breast and nipple: Secondary | ICD-10-CM | POA: Diagnosis not present

## 2017-11-27 DIAGNOSIS — Z170 Estrogen receptor positive status [ER+]: Secondary | ICD-10-CM | POA: Diagnosis not present

## 2017-11-27 DIAGNOSIS — Z510 Encounter for antineoplastic radiation therapy: Secondary | ICD-10-CM | POA: Diagnosis not present

## 2017-11-27 DIAGNOSIS — C50812 Malignant neoplasm of overlapping sites of left female breast: Secondary | ICD-10-CM | POA: Diagnosis not present

## 2017-11-30 ENCOUNTER — Ambulatory Visit
Admission: RE | Admit: 2017-11-30 | Discharge: 2017-11-30 | Disposition: A | Discharge status: Home / Self Care | Payer: 59 | Source: Ambulatory Visit | Attending: Radiation Oncology | Admitting: Radiation Oncology

## 2017-11-30 ENCOUNTER — Ambulatory Visit: Payer: 59

## 2017-11-30 DIAGNOSIS — Z510 Encounter for antineoplastic radiation therapy: Secondary | ICD-10-CM | POA: Diagnosis not present

## 2017-11-30 DIAGNOSIS — M25612 Stiffness of left shoulder, not elsewhere classified: Secondary | ICD-10-CM

## 2017-11-30 DIAGNOSIS — Z483 Aftercare following surgery for neoplasm: Secondary | ICD-10-CM

## 2017-11-30 DIAGNOSIS — C50312 Malignant neoplasm of lower-inner quadrant of left female breast: Secondary | ICD-10-CM | POA: Diagnosis not present

## 2017-11-30 DIAGNOSIS — Z9012 Acquired absence of left breast and nipple: Secondary | ICD-10-CM | POA: Diagnosis not present

## 2017-11-30 DIAGNOSIS — Z170 Estrogen receptor positive status [ER+]: Secondary | ICD-10-CM | POA: Diagnosis not present

## 2017-11-30 DIAGNOSIS — C50812 Malignant neoplasm of overlapping sites of left female breast: Secondary | ICD-10-CM | POA: Diagnosis not present

## 2017-11-30 DIAGNOSIS — Z9189 Other specified personal risk factors, not elsewhere classified: Secondary | ICD-10-CM | POA: Diagnosis not present

## 2017-11-30 NOTE — Therapy (Signed)
[place] [address] [place], [place], [address] Phone: [phone]   Fax:  [phone]  Physical Therapy Treatment  Patient Details  Name: Mackenzie [name] MRN: [id number] Date of Birth: Jun 25, 1970 Referring Provider: Shona Simpson, PA-C   Encounter Date: 11/30/2017  PT End of Session - 11/30/17 0957    Visit Number  13    Number of Visits  17    Date for PT Re-Evaluation  12/18/17    PT Start Time  0943 Pt conts to arrive late    PT Stop Time  1014    PT Time Calculation (min)  31 min    Activity Tolerance  Patient tolerated treatment well    Behavior During Therapy  [facility] for tasks assessed/performed       Past Medical History:  Diagnosis Date  . Anemia   . Breast cancer ([place]) 01/14/2017   Left breast  . Diabetes mellitus    GDM    Past Surgical History:  Procedure Laterality Date  . BREAST RECONSTRUCTION WITH PLACEMENT OF TISSUE EXPANDER AND FLEX HD (ACELLULAR HYDRATED DERMIS) Left 08/27/2017   Procedure: LEFT BREAST RECONSTRUCTION WITH PLACEMENT OF TISSUE EXPANDER AND FLEX HD;  Surgeon: Wallace Going, DO;  Location: [place];  Service: Plastics;  Laterality: Left;  . CESAREAN SECTION     x2  . MASTECTOMY MODIFIED RADICAL Left 08/27/2017  . MASTECTOMY MODIFIED RADICAL Left 08/27/2017   Procedure: LEFT MODIFIED RADICAL MASTECTOMY;  Surgeon: Erroll Luna, MD;  Location: [place];  Service: General;  Laterality: Left;  . PORT-A-CATH REMOVAL Right 08/27/2017   Procedure: REMOVAL PORT-A-CATH RIGHT CHEST;  Surgeon: Erroll Luna, MD;  Location: [place];  Service: General;  Laterality: Right;  . PORTACATH PLACEMENT Right 01/28/2017   Procedure: INSERTION PORT-A-CATH WITH ULTRASOUND;  Surgeon: Erroll Luna, MD;  Location: [place];  Service: General;  Laterality: Right;  . TUBAL LIGATION      There were no vitals filed for this visit.  Subjective Assessment - 11/30/17 0945    Subjective   I'm doing well overall, feeling stronger.     Pertinent History  Left breast cancer diagnosed 01/10/17 by mammogram and needle biopsy. Then had neo-adjuvant chemo starting 02/10/17 until 06/26/17; had left mastectomy 08/27/17 with ALND and immediate expander placed. Has had 6 fills. Still has her drain and Dr. Marla Roe is monitoring that. Will have radiation for 33 visits but it's not scheduled yet. Will have lat flap surgery after radiation. Allergies seasonal and all the time.    Patient Stated Goals  get ROM, strength back; avoid lymphedema    Currently in Pain?  No/denies                       [facility] Adult PT Treatment/Exercise - 11/30/17 0001      Shoulder Exercises: Standing   Horizontal ABduction  Strengthening;Both;10 reps;Theraband Flat back on wall/core engaged, and shoulders/head on wall    Theraband Level (Shoulder Horizontal ABduction)  Level 2 (Red)    External Rotation  Strengthening;Both;10 reps;Theraband Flat back on wall/core engaged, and shoulders/head on wall    Theraband Level (Shoulder External Rotation)  Level 2 (Red)    Flexion  Strengthening;Both;10 reps;Theraband Flat back on wall/core engaged, and shoulders/head on wall    Theraband Level (Shoulder Flexion)  Level 2 (Red) Narrow and Wide Grip, 10 times each    ABduction  AROM;Both;10 reps back against wall keeping arms back, "robbers pose"  Diagonals  Strengthening;Right;Left;10 reps;Theraband;Limitations    Theraband Level (Shoulder Diagonals)  Level 2 (Red) Tactile cuing to Lt UE for dec scap compensation    Other Standing Exercises  3-way arm raises with 2 lb. weights on wrists, x 10 each way with back against wall/core engaged, and shoulders and head against wall      Shoulder Exercises: Pulleys   Flexion  2 minutes Added 2 lbs to wrists    ABduction  2 minutes Added 2 lbs to wrists    ABduction Limitations  VCs to relax Lt shoulder intermittently, pt does well with being able to correct this but  does still require frequent cuing to remember      Shoulder Exercises: Therapy Ball   Flexion  Both;10 reps 2 lbs on each wrist; forward lean into end of stretch    ABduction  Left;10 reps 2 lbs on wrist; same side lean into end of stretch                  PT Long Term Goals - 11/11/17 1636      PT LONG TERM GOAL #1   Title  Pt. will be independent in HEP for shoulder ROM    Status  Achieved      PT LONG TERM GOAL #2   Title  Pt. will be able to get left arm into position for radiation treatment.    Status  Achieved      PT LONG TERM GOAL #3   Title  left shoulder flexion to 130 degrees    Baseline  right goes to this point at eval; left to 120 on 11/04/17; 127 on 11/09/17    Status  Partially Met      PT LONG TERM GOAL #4   Title  left shoulder abduction to 140 degrees    Baseline  right goes to this point at eval; left to 90 on 11/04/17; 107 on 11/09/17    Status  Partially Met            Plan - 11/30/17 0958    Clinical Impression Statement  Limited treatment again due to pt arriving late. Continued to focus strengthening exercises, reviewing some of HEP as well. Pt reports feeling much better, stronger, and feels ready for D/C at next visit.     Rehab Potential  Good    Clinical Impairments Affecting Rehab Potential  drain was removed 10/23/17; started radiation 11/16/17    PT Frequency  2x / week    PT Duration  4 weeks    PT Treatment/Interventions  ADLs/Self Care Home Management;Therapeutic exercise;Patient/family education;Manual techniques;Manual lymph drainage;Compression bandaging;Scar mobilization;Passive range of motion    PT Next Visit Plan  Reassess goals and ROM for D/C next visit; also reviewing HEP prn. Cont bil UE strength.     Consulted and Agree with Plan of Care  Patient       Patient will benefit from skilled therapeutic intervention in order to improve the following deficits and impairments:  Decreased range of motion, Impaired UE functional  use, Decreased strength, Other (comment)  Visit Diagnosis: Stiffness of left shoulder, not elsewhere classified  Aftercare following surgery for neoplasm  At risk for lymphedema     Problem List Patient Active Problem List   Diagnosis Date Noted  . Morbid obesity with body mass index (BMI) of 40.0 to 49.9 ([place]) 09/17/2017  . Genetic testing 03/25/2017  . Port catheter in place 02/10/2017  . Malignant neoplasm of overlapping sites of  left breast in female, estrogen receptor positive ([place]) 01/23/2017  . 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

## 2017-12-01 ENCOUNTER — Ambulatory Visit
Admission: RE | Admit: 2017-12-01 | Discharge: 2017-12-01 | Disposition: A | Discharge status: Home / Self Care | Payer: 59 | Source: Ambulatory Visit | Attending: Radiation Oncology | Admitting: Radiation Oncology

## 2017-12-01 DIAGNOSIS — C50312 Malignant neoplasm of lower-inner quadrant of left female breast: Secondary | ICD-10-CM | POA: Diagnosis not present

## 2017-12-01 DIAGNOSIS — Z510 Encounter for antineoplastic radiation therapy: Secondary | ICD-10-CM | POA: Diagnosis not present

## 2017-12-01 DIAGNOSIS — Z170 Estrogen receptor positive status [ER+]: Secondary | ICD-10-CM | POA: Diagnosis not present

## 2017-12-01 DIAGNOSIS — Z9012 Acquired absence of left breast and nipple: Secondary | ICD-10-CM | POA: Diagnosis not present

## 2017-12-01 DIAGNOSIS — C50812 Malignant neoplasm of overlapping sites of left female breast: Secondary | ICD-10-CM | POA: Diagnosis not present

## 2017-12-02 ENCOUNTER — Ambulatory Visit
Admission: RE | Admit: 2017-12-02 | Discharge: 2017-12-02 | Disposition: A | Discharge status: Home / Self Care | Payer: 59 | Source: Ambulatory Visit | Attending: Radiation Oncology | Admitting: Radiation Oncology

## 2017-12-02 DIAGNOSIS — Z510 Encounter for antineoplastic radiation therapy: Secondary | ICD-10-CM | POA: Diagnosis not present

## 2017-12-02 DIAGNOSIS — C50312 Malignant neoplasm of lower-inner quadrant of left female breast: Secondary | ICD-10-CM | POA: Diagnosis not present

## 2017-12-02 DIAGNOSIS — C50812 Malignant neoplasm of overlapping sites of left female breast: Secondary | ICD-10-CM | POA: Diagnosis not present

## 2017-12-02 DIAGNOSIS — Z9012 Acquired absence of left breast and nipple: Secondary | ICD-10-CM | POA: Diagnosis not present

## 2017-12-02 DIAGNOSIS — Z170 Estrogen receptor positive status [ER+]: Secondary | ICD-10-CM | POA: Diagnosis not present

## 2017-12-03 ENCOUNTER — Ambulatory Visit
Admission: RE | Admit: 2017-12-03 | Discharge: 2017-12-03 | Disposition: A | Discharge status: Home / Self Care | Payer: 59 | Source: Ambulatory Visit | Attending: Radiation Oncology | Admitting: Radiation Oncology

## 2017-12-03 ENCOUNTER — Ambulatory Visit: Payer: 59 | Admitting: Physical Therapy

## 2017-12-03 DIAGNOSIS — M25612 Stiffness of left shoulder, not elsewhere classified: Secondary | ICD-10-CM

## 2017-12-03 DIAGNOSIS — E559 Vitamin D deficiency, unspecified: Secondary | ICD-10-CM | POA: Diagnosis not present

## 2017-12-03 DIAGNOSIS — R7309 Other abnormal glucose: Secondary | ICD-10-CM | POA: Diagnosis not present

## 2017-12-03 DIAGNOSIS — Z9189 Other specified personal risk factors, not elsewhere classified: Secondary | ICD-10-CM

## 2017-12-03 DIAGNOSIS — Z0000 Encounter for general adult medical examination without abnormal findings: Secondary | ICD-10-CM | POA: Diagnosis not present

## 2017-12-03 DIAGNOSIS — Z483 Aftercare following surgery for neoplasm: Secondary | ICD-10-CM

## 2017-12-03 DIAGNOSIS — E785 Hyperlipidemia, unspecified: Secondary | ICD-10-CM | POA: Diagnosis not present

## 2017-12-03 DIAGNOSIS — C50812 Malignant neoplasm of overlapping sites of left female breast: Secondary | ICD-10-CM | POA: Diagnosis not present

## 2017-12-03 DIAGNOSIS — Z170 Estrogen receptor positive status [ER+]: Secondary | ICD-10-CM | POA: Diagnosis not present

## 2017-12-03 DIAGNOSIS — C50312 Malignant neoplasm of lower-inner quadrant of left female breast: Secondary | ICD-10-CM | POA: Diagnosis not present

## 2017-12-03 DIAGNOSIS — Z9012 Acquired absence of left breast and nipple: Secondary | ICD-10-CM | POA: Diagnosis not present

## 2017-12-03 DIAGNOSIS — Z510 Encounter for antineoplastic radiation therapy: Secondary | ICD-10-CM | POA: Diagnosis not present

## 2017-12-03 NOTE — Therapy (Signed)
[place], [place], [address] Phone: [phone]   Fax:  [phone]  Physical Therapy Treatment  Patient Details  Name: Mackenzie [name] MRN: [id number] Date of Birth: 1971/02/21 Referring Provider: Shona Simpson, PA-C   Encounter Date: 12/03/2017  PT End of Session - 12/03/17 1207    Visit Number  14    Number of Visits  22    Date for PT Re-Evaluation  01/06/18    PT Start Time  1029 Pt. again late for session, as she left for a time after checking in and came back after her appointment time    PT Stop Time  1101    PT Time Calculation (min)  32 min    Activity Tolerance  Patient tolerated treatment well    Behavior During Therapy  [facility] for tasks assessed/performed       Past Medical History:  Diagnosis Date  . Anemia   . Breast cancer ([place]) 01/14/2017   Left breast  . Diabetes mellitus    GDM    Past Surgical History:  Procedure Laterality Date  . BREAST RECONSTRUCTION WITH PLACEMENT OF TISSUE EXPANDER AND FLEX HD (ACELLULAR HYDRATED DERMIS) Left 08/27/2017   Procedure: LEFT BREAST RECONSTRUCTION WITH PLACEMENT OF TISSUE EXPANDER AND FLEX HD;  Surgeon: Wallace Going, DO;  Location: [name];  Service: Plastics;  Laterality: Left;  . CESAREAN SECTION     x2  . MASTECTOMY MODIFIED RADICAL Left 08/27/2017  . MASTECTOMY MODIFIED RADICAL Left 08/27/2017   Procedure: LEFT MODIFIED RADICAL MASTECTOMY;  Surgeon: Erroll Luna, MD;  Location: [place];  Service: General;  Laterality: Left;  . PORT-A-CATH REMOVAL Right 08/27/2017   Procedure: REMOVAL PORT-A-CATH RIGHT CHEST;  Surgeon: Erroll Luna, MD;  Location: [place];  Service: General;  Laterality: Right;  . PORTACATH PLACEMENT Right 01/28/2017   Procedure: INSERTION PORT-A-CATH WITH ULTRASOUND;  Surgeon: Erroll Luna, MD;  Location: [place];  Service: General;  Laterality: Right;  . TUBAL LIGATION      There were no  vitals filed for this visit.  Subjective Assessment - 12/03/17 1030    Subjective  "I'm concerned about my arm swelling. I don't know if therapy can help that. The back part of the upper arm and my elbow are swollen."    Pertinent History  Left breast cancer diagnosed 01/10/17 by mammogram and needle biopsy. Then had neo-adjuvant chemo starting 02/10/17 until 06/26/17; had left mastectomy 08/27/17 with ALND and immediate expander placed. Has had 6 fills. Still has her drain and Dr. Marla Roe is monitoring that. Will have radiation for 33 visits but it's not scheduled yet. Will have lat flap surgery after radiation. Allergies seasonal and all the time.    Patient Stated Goals  get ROM, strength back; avoid lymphedema    Currently in Pain?  No/denies         [facility] PT Assessment - 12/03/17 0001      AROM   Left Shoulder Flexion  128 Degrees    Left Shoulder ABduction  128 Degrees        LYMPHEDEMA/ONCOLOGY QUESTIONNAIRE - 12/03/17 1034      Left Upper Extremity Lymphedema   10 cm Proximal to Olecranon Process  41.2 cm    Olecranon Process  31.5 cm    10 cm Proximal to Ulnar Styloid Process  28.4 cm    Just Proximal to Ulnar Styloid Process  20.2 cm    Across Hand at [employer]  Space  21.2 cm    At [place] of 2nd Digit  6.5 cm                [facility] Adult PT Treatment/Exercise - 12/03/17 0001      Shoulder Exercises: Pulleys   Flexion  2 minutes therapist corrected positioning    ABduction  2 minutes      Shoulder Exercises: Therapy Ball   Flexion  Both;10 reps    ABduction  5 reps             PT Education - 12/03/17 1050    Education provided  Yes    Education Details  about lymphedema risk reduction and obtaining a compression sleeve and gauntlet    Person(s) Educated  Patient;Child(ren)    Methods  Handout ABC class handout    Comprehension  Verbalized understanding          PT Long Term Goals - 12/03/17 1033      PT LONG TERM GOAL #1   Title  Pt. will be  independent in HEP for shoulder ROM    Status  Achieved      PT LONG TERM GOAL #2   Title  Pt. will be able to get left arm into position for radiation treatment.    Status  Achieved      PT LONG TERM GOAL #3   Title  left shoulder flexion to 130 degrees    Baseline  right goes to this point at eval; left to 120 on 11/04/17; 127 on 11/09/17; 128 on 12/03/17    Status  Partially Met      PT LONG TERM GOAL #4   Title  left shoulder abduction to 140 degrees    Baseline  right goes to this point at eval; left to 90 on 11/04/17; 107 on 11/09/17; 128 on 12/03/17    Status  Partially Met            Plan - 12/03/17 1210    Clinical Impression Statement  Pt. has made nice gains but is still limited in her left shoulder AROM and has not met goals for that yet. She felt like her left arm had swollen, but measurements today indicate that it is overall a bit smaller than the previous measurements. Plan is to continue 2x/week x 2 weeks or longer to work toward ROM goals and keep tabs on arm circumferences. She was educated about lymphedema risk reduction including obtaining a compression sleeve today.    Rehab Potential  Good    Clinical Impairments Affecting Rehab Potential  drain was removed 10/23/17; started radiation 11/16/17    PT Frequency  2x / week    PT Duration  2 weeks to 4 weeks if needed    PT Treatment/Interventions  ADLs/Self Care Home Management;Therapeutic exercise;Patient/family education;Manual techniques;Manual lymph drainage;Compression bandaging;Scar mobilization;Passive range of motion    PT Next Visit Plan  Continue 2x/week with focus on ROM goals, strengthening and monitoring left arm circumferences.    PT Home Exercise Plan  standing dowel exercises with flexion and abduction, doorway stretch, hooklying with arms outstretched lower trunk rotation, supine with hands behind head, pull elbows back    Consulted and Agree with Plan of Care  Patient       Patient will benefit from  skilled therapeutic intervention in order to improve the following deficits and impairments:  Decreased range of motion, Impaired UE functional use, Decreased strength, Other (comment)  Visit Diagnosis: Stiffness of left shoulder,  not elsewhere classified - Plan: PT plan of care cert/re-cert  Aftercare following surgery for neoplasm - Plan: PT plan of care cert/re-cert  At risk for lymphedema - Plan: PT plan of care cert/re-cert     Problem List Patient Active Problem List   Diagnosis Date Noted  . Morbid obesity with body mass index (BMI) of 40.0 to 49.9 ([place]) 09/17/2017  . Genetic testing 03/25/2017  . Port catheter in place 02/10/2017  . Malignant 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, estrogen receptor positive ([place]) 01/23/2017  . 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

## 2017-12-04 ENCOUNTER — Ambulatory Visit
Admission: RE | Admit: 2017-12-04 | Discharge: 2017-12-04 | Disposition: A | Discharge status: Home / Self Care | Payer: 59 | Source: Ambulatory Visit | Attending: Radiation Oncology | Admitting: Radiation Oncology

## 2017-12-04 DIAGNOSIS — Z510 Encounter for antineoplastic radiation therapy: Secondary | ICD-10-CM | POA: Diagnosis not present

## 2017-12-04 DIAGNOSIS — Z170 Estrogen receptor positive status [ER+]: Secondary | ICD-10-CM | POA: Diagnosis not present

## 2017-12-04 DIAGNOSIS — Z9012 Acquired absence of left breast and nipple: Secondary | ICD-10-CM | POA: Diagnosis not present

## 2017-12-04 DIAGNOSIS — C50312 Malignant neoplasm of lower-inner quadrant of left female breast: Secondary | ICD-10-CM | POA: Diagnosis not present

## 2017-12-04 DIAGNOSIS — C50812 Malignant neoplasm of overlapping sites of left female breast: Secondary | ICD-10-CM | POA: Diagnosis not present

## 2017-12-07 ENCOUNTER — Ambulatory Visit
Admission: RE | Admit: 2017-12-07 | Discharge: 2017-12-07 | Disposition: A | Discharge status: Home / Self Care | Payer: 59 | Source: Ambulatory Visit | Attending: Radiation Oncology | Admitting: Radiation Oncology

## 2017-12-07 DIAGNOSIS — Z510 Encounter for antineoplastic radiation therapy: Secondary | ICD-10-CM | POA: Diagnosis not present

## 2017-12-07 DIAGNOSIS — E559 Vitamin D deficiency, unspecified: Secondary | ICD-10-CM | POA: Diagnosis not present

## 2017-12-07 DIAGNOSIS — E669 Obesity, unspecified: Secondary | ICD-10-CM | POA: Diagnosis not present

## 2017-12-07 DIAGNOSIS — C50812 Malignant neoplasm of overlapping sites of left female breast: Secondary | ICD-10-CM | POA: Diagnosis not present

## 2017-12-07 DIAGNOSIS — R7309 Other abnormal glucose: Secondary | ICD-10-CM | POA: Diagnosis not present

## 2017-12-07 DIAGNOSIS — Z0000 Encounter for general adult medical examination without abnormal findings: Secondary | ICD-10-CM | POA: Diagnosis not present

## 2017-12-07 DIAGNOSIS — E785 Hyperlipidemia, unspecified: Secondary | ICD-10-CM | POA: Diagnosis not present

## 2017-12-07 DIAGNOSIS — C50312 Malignant neoplasm of lower-inner quadrant of left female breast: Secondary | ICD-10-CM | POA: Diagnosis not present

## 2017-12-07 DIAGNOSIS — C50919 Malignant neoplasm of unspecified site of unspecified female breast: Secondary | ICD-10-CM | POA: Diagnosis not present

## 2017-12-07 DIAGNOSIS — L819 Disorder of pigmentation, unspecified: Secondary | ICD-10-CM | POA: Insufficient documentation

## 2017-12-07 DIAGNOSIS — Z170 Estrogen receptor positive status [ER+]: Secondary | ICD-10-CM | POA: Insufficient documentation

## 2017-12-07 DIAGNOSIS — Z01411 Encounter for gynecological examination (general) (routine) with abnormal findings: Secondary | ICD-10-CM | POA: Diagnosis not present

## 2017-12-08 ENCOUNTER — Ambulatory Visit
Admission: RE | Admit: 2017-12-08 | Discharge: 2017-12-08 | Disposition: A | Discharge status: Home / Self Care | Payer: 59 | Source: Ambulatory Visit | Attending: Radiation Oncology | Admitting: Radiation Oncology

## 2017-12-08 DIAGNOSIS — C50312 Malignant neoplasm of lower-inner quadrant of left female breast: Secondary | ICD-10-CM | POA: Diagnosis not present

## 2017-12-08 DIAGNOSIS — Z510 Encounter for antineoplastic radiation therapy: Secondary | ICD-10-CM | POA: Diagnosis not present

## 2017-12-08 DIAGNOSIS — Z170 Estrogen receptor positive status [ER+]: Secondary | ICD-10-CM | POA: Diagnosis not present

## 2017-12-08 DIAGNOSIS — L819 Disorder of pigmentation, unspecified: Secondary | ICD-10-CM | POA: Diagnosis not present

## 2017-12-08 DIAGNOSIS — C50812 Malignant neoplasm of overlapping sites of left female breast: Secondary | ICD-10-CM | POA: Diagnosis not present

## 2017-12-09 ENCOUNTER — Ambulatory Visit: Payer: 59 | Attending: Radiation Oncology

## 2017-12-09 ENCOUNTER — Ambulatory Visit
Admission: RE | Admit: 2017-12-09 | Discharge: 2017-12-09 | Disposition: A | Discharge status: Home / Self Care | Payer: 59 | Source: Ambulatory Visit | Attending: Radiation Oncology | Admitting: Radiation Oncology

## 2017-12-09 DIAGNOSIS — C50312 Malignant neoplasm of lower-inner quadrant of left female breast: Secondary | ICD-10-CM | POA: Diagnosis not present

## 2017-12-09 DIAGNOSIS — Z170 Estrogen receptor positive status [ER+]: Secondary | ICD-10-CM | POA: Diagnosis not present

## 2017-12-09 DIAGNOSIS — Z9189 Other specified personal risk factors, not elsewhere classified: Secondary | ICD-10-CM | POA: Diagnosis not present

## 2017-12-09 DIAGNOSIS — Z483 Aftercare following surgery for neoplasm: Secondary | ICD-10-CM | POA: Diagnosis not present

## 2017-12-09 DIAGNOSIS — L819 Disorder of pigmentation, unspecified: Secondary | ICD-10-CM | POA: Diagnosis not present

## 2017-12-09 DIAGNOSIS — Z510 Encounter for antineoplastic radiation therapy: Secondary | ICD-10-CM | POA: Diagnosis not present

## 2017-12-09 DIAGNOSIS — C50812 Malignant neoplasm of overlapping sites of left female breast: Secondary | ICD-10-CM | POA: Diagnosis not present

## 2017-12-09 DIAGNOSIS — M25612 Stiffness of left shoulder, not elsewhere classified: Secondary | ICD-10-CM | POA: Diagnosis not present

## 2017-12-09 NOTE — Therapy (Signed)
[place], [place], [address] Phone: [phone]   Fax:  [phone]  Physical Therapy Treatment  Patient Details  Name: Mackenzie [name] MRN: [id number] Date of Birth: 07-13-1970 Referring Provider: Shona Simpson, PA-C   Encounter Date: 12/09/2017  PT End of Session - 12/09/17 0934    Visit Number  15    Number of Visits  22    Date for PT Re-Evaluation  01/06/18    PT Start Time  0854 Pt arrived late    PT Stop Time  0934    PT Time Calculation (min)  40 min    Activity Tolerance  Patient tolerated treatment well    Behavior During Therapy  [facility] for tasks assessed/performed       Past Medical History:  Diagnosis Date  . Anemia   . Breast cancer ([place]) 01/14/2017   Left breast  . Diabetes mellitus    GDM    Past Surgical History:  Procedure Laterality Date  . BREAST RECONSTRUCTION WITH PLACEMENT OF TISSUE EXPANDER AND FLEX HD (ACELLULAR HYDRATED DERMIS) Left 08/27/2017   Procedure: LEFT BREAST RECONSTRUCTION WITH PLACEMENT OF TISSUE EXPANDER AND FLEX HD;  Surgeon: Wallace Going, DO;  Location: [place];  Service: Plastics;  Laterality: Left;  . CESAREAN SECTION     x2  . MASTECTOMY MODIFIED RADICAL Left 08/27/2017  . MASTECTOMY MODIFIED RADICAL Left 08/27/2017   Procedure: LEFT MODIFIED RADICAL MASTECTOMY;  Surgeon: Erroll Luna, MD;  Location: [place];  Service: General;  Laterality: Left;  . PORT-A-CATH REMOVAL Right 08/27/2017   Procedure: REMOVAL PORT-A-CATH RIGHT CHEST;  Surgeon: Erroll Luna, MD;  Location: [place];  Service: General;  Laterality: Right;  . PORTACATH PLACEMENT Right 01/28/2017   Procedure: INSERTION PORT-A-CATH WITH ULTRASOUND;  Surgeon: Erroll Luna, MD;  Location: [place];  Service: General;  Laterality: Right;  . TUBAL LIGATION      There were no vitals filed for this visit.  Subjective Assessment - 12/09/17 0908    Subjective  Nothing  new to report sinceI was here last. I can tell I'm getting stronger. At times I can feel tightness in my Lt axilla that spreads into my expander normally at the end of the day and sometimes in the morning.      Pertinent History  Left breast cancer diagnosed 01/10/17 by mammogram and needle biopsy. Then had neo-adjuvant chemo starting 02/10/17 until 06/26/17; had left mastectomy 08/27/17 with ALND and immediate expander placed. Has had 6 fills. Still has her drain and Dr. Marla Roe is monitoring that. Will have radiation for 33 visits but it's not scheduled yet. Will have lat flap surgery after radiation. Allergies seasonal and all the time.    Patient Stated Goals  get ROM, strength back; avoid lymphedema    Currently in Pain?  No/denies                       [facility] Adult PT Treatment/Exercise - 12/09/17 0001      Self-Care   Self-Care  Other Self-Care Comments    Other Self-Care Comments   Educated pt in lymphedema risk reduction and infection prevention.      Shoulder Exercises: Standing   Other Standing Exercises  3-way arm raises with 3 lb. weights on wrists, x 10 each way with back against wall/core engaged, and shoulders and head against wall      Shoulder Exercises: Pulleys   Flexion  2 minutes  2 lbs on wrist    Flexion Limitations  Pt with good technique today    ABduction  2 minutes 2 lbs on wrist      Shoulder Exercises: Therapy Ball   Flexion  Both;10 reps Forward lean into end of stretch; 2 lbs on wrist    ABduction  Both;10 reps      Manual Therapy   Myofascial Release  left UE myofascial pulling with movement into abduction    Passive ROM  in supine for left shoulder stetch into D2, abduction, flexion, and positioning for radiation                  PT Long Term Goals - 12/03/17 1033      PT LONG TERM GOAL #1   Title  Pt. will be independent in HEP for shoulder ROM    Status  Achieved      PT LONG TERM GOAL #2   Title  Pt. will be able to get  left arm into position for radiation treatment.    Status  Achieved      PT LONG TERM GOAL #3   Title  left shoulder flexion to 130 degrees    Baseline  right goes to this point at eval; left to 120 on 11/04/17; 127 on 11/09/17; 128 on 12/03/17    Status  Partially Met      PT LONG TERM GOAL #4   Title  left shoulder abduction to 140 degrees    Baseline  right goes to this point at eval; left to 90 on 11/04/17; 107 on 11/09/17; 128 on 12/03/17    Status  Partially Met            Plan - 12/09/17 0934    Clinical Impression Statement  Continued progressing strength with 3 lb weights today for 3 way raises but only doing 1 x 10. Educated pt in lymphedema risk reduction and infection prevention answering her questions throughout. Pt is progressing well todays being independent with HEP for strength and will benefit from learning the Strenght ABC Program. Also she is still limited with her end ROM so briefly focused on that at end of session.     Rehab Potential  Good    Clinical Impairments Affecting Rehab Potential  drain was removed 10/23/17; started radiation 11/16/17    PT Frequency  2x / week    PT Duration  2 weeks to 4 weeks if needed    PT Treatment/Interventions  ADLs/Self Care Home Management;Therapeutic exercise;Patient/family education;Manual techniques;Manual lymph drainage;Compression bandaging;Scar mobilization;Passive range of motion    PT Next Visit Plan  Strength ABC Program and focus on end ROM as time allows.     Consulted and Agree with Plan of Care  Patient       Patient will benefit from skilled therapeutic intervention in order to improve the following deficits and impairments:  Decreased range of motion, Impaired UE functional use, Decreased strength, Other (comment)  Visit Diagnosis: Stiffness of left shoulder, not elsewhere classified  Aftercare following surgery for neoplasm  At risk for lymphedema     Problem List Patient Active Problem List   Diagnosis  Date Noted  . Morbid obesity with body mass index (BMI) of 40.0 to 49.9 ([place]) 09/17/2017  . Genetic testing 03/25/2017  . Port catheter in place 02/10/2017  . Malignant 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, estrogen receptor positive ([place]) 01/23/2017  . 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

## 2017-12-11 ENCOUNTER — Ambulatory Visit
Admission: RE | Admit: 2017-12-11 | Discharge: 2017-12-11 | Disposition: A | Discharge status: Home / Self Care | Payer: 59 | Source: Ambulatory Visit | Attending: Radiation Oncology | Admitting: Radiation Oncology

## 2017-12-11 DIAGNOSIS — C50812 Malignant neoplasm of overlapping sites of left female breast: Secondary | ICD-10-CM | POA: Diagnosis not present

## 2017-12-11 DIAGNOSIS — L819 Disorder of pigmentation, unspecified: Secondary | ICD-10-CM | POA: Diagnosis not present

## 2017-12-11 DIAGNOSIS — C50312 Malignant neoplasm of lower-inner quadrant of left female breast: Secondary | ICD-10-CM | POA: Diagnosis not present

## 2017-12-11 DIAGNOSIS — Z510 Encounter for antineoplastic radiation therapy: Secondary | ICD-10-CM | POA: Diagnosis not present

## 2017-12-11 DIAGNOSIS — Z170 Estrogen receptor positive status [ER+]: Secondary | ICD-10-CM | POA: Diagnosis not present

## 2017-12-14 ENCOUNTER — Ambulatory Visit: Payer: 59

## 2017-12-14 ENCOUNTER — Telehealth: Payer: Self-pay | Admitting: Adult Health

## 2017-12-14 ENCOUNTER — Encounter: Payer: Self-pay | Admitting: Adult Health

## 2017-12-14 ENCOUNTER — Inpatient Hospital Stay: Payer: 59

## 2017-12-14 ENCOUNTER — Ambulatory Visit
Admission: RE | Admit: 2017-12-14 | Discharge: 2017-12-14 | Disposition: A | Discharge status: Home / Self Care | Payer: 59 | Source: Ambulatory Visit | Attending: Radiation Oncology | Admitting: Radiation Oncology

## 2017-12-14 ENCOUNTER — Other Ambulatory Visit: Payer: 59

## 2017-12-14 ENCOUNTER — Inpatient Hospital Stay: Payer: 59 | Attending: Adult Health | Admitting: Adult Health

## 2017-12-14 ENCOUNTER — Ambulatory Visit: Payer: 59 | Admitting: Adult Health

## 2017-12-14 ENCOUNTER — Inpatient Hospital Stay: Payer: 59 | Admitting: *Deleted

## 2017-12-14 VITALS — BP 148/79 | HR 76 | Temp 98.5°F | Resp 18 | Ht 67.0 in | Wt 279.5 lb

## 2017-12-14 DIAGNOSIS — C773 Secondary and unspecified malignant neoplasm of axilla and upper limb lymph nodes: Secondary | ICD-10-CM | POA: Insufficient documentation

## 2017-12-14 DIAGNOSIS — Z510 Encounter for antineoplastic radiation therapy: Secondary | ICD-10-CM | POA: Diagnosis not present

## 2017-12-14 DIAGNOSIS — Z170 Estrogen receptor positive status [ER+]: Secondary | ICD-10-CM

## 2017-12-14 DIAGNOSIS — C50812 Malignant neoplasm of overlapping sites of left female breast: Secondary | ICD-10-CM | POA: Insufficient documentation

## 2017-12-14 DIAGNOSIS — L988 Other specified disorders of the skin and subcutaneous tissue: Secondary | ICD-10-CM | POA: Diagnosis not present

## 2017-12-14 DIAGNOSIS — Z5111 Encounter for antineoplastic chemotherapy: Secondary | ICD-10-CM | POA: Insufficient documentation

## 2017-12-14 DIAGNOSIS — C50312 Malignant neoplasm of lower-inner quadrant of left female breast: Secondary | ICD-10-CM | POA: Diagnosis not present

## 2017-12-14 DIAGNOSIS — Z95828 Presence of other vascular implants and grafts: Secondary | ICD-10-CM

## 2017-12-14 DIAGNOSIS — L819 Disorder of pigmentation, unspecified: Secondary | ICD-10-CM | POA: Diagnosis not present

## 2017-12-14 LAB — CBC WITH DIFFERENTIAL/PLATELET
BASOS ABS: 0 10*3/uL (ref 0.0–0.1)
BASOS PCT: 0 %
Eosinophils Absolute: 0 10*3/uL (ref 0.0–0.5)
Eosinophils Relative: 1 %
HEMATOCRIT: 33.7 % — AB (ref 34.8–46.6)
Hemoglobin: 11 g/dL — ABNORMAL LOW (ref 11.6–15.9)
LYMPHS PCT: 11 %
Lymphs Abs: 0.6 10*3/uL — ABNORMAL LOW (ref 0.9–3.3)
MCH: 26.7 pg (ref 25.1–34.0)
MCHC: 32.8 g/dL (ref 31.5–36.0)
MCV: 81.3 fL (ref 79.5–101.0)
MONO ABS: 0.6 10*3/uL (ref 0.1–0.9)
Monocytes Relative: 11 %
NEUTROS ABS: 4.1 10*3/uL (ref 1.5–6.5)
Neutrophils Relative %: 77 %
PLATELETS: 223 10*3/uL (ref 145–400)
RBC: 4.14 MIL/uL (ref 3.70–5.45)
RDW: 17.4 % — AB (ref 11.2–14.5)
WBC: 5.3 10*3/uL (ref 3.9–10.3)

## 2017-12-14 LAB — COMPREHENSIVE METABOLIC PANEL
ALK PHOS: 144 U/L — AB (ref 38–126)
ALT: 16 U/L (ref 0–44)
ANION GAP: 3 — AB (ref 5–15)
AST: 13 U/L — ABNORMAL LOW (ref 15–41)
Albumin: 3.6 g/dL (ref 3.5–5.0)
BILIRUBIN TOTAL: 0.2 mg/dL — AB (ref 0.3–1.2)
BUN: 18 mg/dL (ref 6–20)
CO2: 29 mmol/L (ref 22–32)
Calcium: 9.4 mg/dL (ref 8.9–10.3)
Chloride: 106 mmol/L (ref 98–111)
Creatinine, Ser: 0.82 mg/dL (ref 0.44–1.00)
GLUCOSE: 97 mg/dL (ref 70–99)
POTASSIUM: 3.9 mmol/L (ref 3.5–5.1)
Sodium: 138 mmol/L (ref 135–145)
TOTAL PROTEIN: 7.2 g/dL (ref 6.5–8.1)

## 2017-12-14 MED ORDER — GOSERELIN ACETATE 3.6 MG ~~LOC~~ IMPL
3.6000 mg | DRUG_IMPLANT | Freq: Once | SUBCUTANEOUS | Status: AC
  Administered 2017-12-14: 3.6 mg via SUBCUTANEOUS

## 2017-12-14 MED ORDER — GOSERELIN ACETATE 3.6 MG ~~LOC~~ IMPL
DRUG_IMPLANT | SUBCUTANEOUS | Status: AC
  Filled 2017-12-14: qty 3.6

## 2017-12-14 MED ORDER — RADIAPLEXRX EX GEL
Freq: Once | CUTANEOUS | Status: AC
  Administered 2017-12-14: 17:00:00 via TOPICAL

## 2017-12-14 NOTE — Progress Notes (Signed)
[place]  [phone] [phone]     ID: Mackenzie [name] DOB: 06-05-1971  MR#: [id number]  TIR#:[id number]  Patient Care Team: Willey Blade, MD as PCP - General (Internal Medicine) Magrinat, [name], MD as Consulting Physician (Oncology) Erroll Luna, MD as Consulting Physician (General Surgery) Kyung Rudd, MD as Consulting Physician (Radiation Oncology) Dillingham, Loel Lofty, DO as Attending Physician (Plastic Surgery) OTHER MD:  CHIEF COMPLAINT: Estrogen receptor positive breast cancer  CURRENT TREATMENT: Adjuvant radiation with capecitabine sensitization   HISTORY OF CURRENT ILLNESS: From the original intake note:  The patient herself noted some changes in her left breast late July 2018, she says, and eventually brought this to medical attention so that on 01/09/2017 she underwent bilateral diagnostic mammography with tomography and left breast ultrasonography at the breast Center. This found the breast density to be category C. In the upper inner quadrant of the left breast there was an area of asymmetry and there were malignant type calcifications involving all 4 quadrants. On exam there is firmness and palpable thickening in the anterior left breast with skin dimpling. Ultrasonography found at the 9:30 o'clock radiant 3 cm from the nipple a 2.7 cm mass and in the left axilla for abnormal lymph nodes largest of which measured 2.7 cm.  Biopsy of the left breast 9:30 o'clock mass 80 01/26/2017 showed ([id number]) invasive ductal carcinoma, with extracellular mucin, grade 1 or 2. In the lower outer left breast is separated biopsy the same day showed ductal carcinoma in situ. One of the 4 lymph nodes involved was positive for metastatic carcinoma. Prognostic panel on the invasive disease showed it to be estrogen receptor 100% positive, progesterone receptor 20% positive, both with strong staining intensity, with an MIB-1 of 20%, and no HER-2  amplification with a signals ratio 1.38 and the number per cell 2.90.  On 01/22/2017 the patient underwent bilateral breast MRI. This showed no involvement of the right breast, but in the left breast there was a masslike and non-masslike enhancement involving all quadrants, with skin swelling but no abnormal enhancement of the skin or nipple areolar complex or pectoralis muscle. The mass could not be clearly measured but spanned approximately 12 cm. There was bulky left axillary lymphadenopathy, with the largest lymph node measuring up to 3.4 cm.  The patient's subsequent history is as detailed below.  INTERVAL HISTORY: Mackenzie [name] returns today for evaluation and treatment of her estrogen receptor positive breast cancer. She is currently receiving adjuvant radiation treatments, scheduled through until the end of July.    She continues on capecitabine. She tolerates this well. She takes 2 tablets BID.   She also receives goserelin every 4 weeks. She also tolerates this well and denies any issues.    REVIEW OF SYSTEMS: Mackenzie [name] is feeling well today.  She is exercising.  She says her fatigue is improving.  Her appetite has remained well.  She denies any skin changes, diarrhea, nausea, vomiting, fevers, chills, headaches, shortness of breath, cough or any other issues today.  A detailed ROS is non contributory.    PAST MEDICAL HISTORY: Past Medical History:  Diagnosis Date  . Anemia   . Breast cancer ([place]) 01/14/2017   Left breast  . Diabetes mellitus    GDM    PAST SURGICAL HISTORY: Past Surgical History:  Procedure Laterality Date  . BREAST RECONSTRUCTION WITH PLACEMENT OF TISSUE EXPANDER AND FLEX HD (ACELLULAR HYDRATED DERMIS) Left 08/27/2017   Procedure: LEFT BREAST RECONSTRUCTION WITH PLACEMENT OF TISSUE  EXPANDER AND FLEX HD;  Surgeon: Wallace Going, DO;  Location: [place];  Service: Plastics;  Laterality: Left;  . CESAREAN SECTION     x2  . MASTECTOMY MODIFIED RADICAL Left  08/27/2017  . MASTECTOMY MODIFIED RADICAL Left 08/27/2017   Procedure: LEFT MODIFIED RADICAL MASTECTOMY;  Surgeon: Erroll Luna, MD;  Location: [place];  Service: General;  Laterality: Left;  . PORT-A-CATH REMOVAL Right 08/27/2017   Procedure: REMOVAL PORT-A-CATH RIGHT CHEST;  Surgeon: Erroll Luna, MD;  Location: [place];  Service: General;  Laterality: Right;  . PORTACATH PLACEMENT Right 01/28/2017   Procedure: INSERTION PORT-A-CATH WITH ULTRASOUND;  Surgeon: Erroll Luna, MD;  Location: [place];  Service: General;  Laterality: Right;  . TUBAL LIGATION      FAMILY HISTORY Family History  Problem Relation Age of Onset  . Breast cancer Paternal Grandmother 54       d.60s from breast cancer. Did not have treatment.  . Other Mother        J.18 from complications of surgery to remove brain tumor  The patient's father still alive at age 46. The patient's mother died with a brain tumor which the patient says was "benign". She was 56. The patient has one brother, no sisters. The only breast cancer in the family is a paternal grandmother who died from breast cancer at an unknown age. There is no history of ovarian or prostate cancer in the family.  GYNECOLOGIC HISTORY:  No LMP recorded. (Menstrual status: Chemotherapy). Menarche age 63 and first live birth age 56 she is [place] P4. The person is still having regular periods as of August 2018  SOCIAL HISTORY: (As of August 2018) Mackenzie [name] works at [employer] on the fifth floor, [place]. Her husband, [name], is a Building control surveyor. Daughter and married and lives in [place] and works as a Education administrator. Daughter, [name] lives in [place] and works in the police department. Daughters, [place] and [place] are 74 and 6, living at home. The patient has no grandchildren. She attends a Tour manager   ADVANCED DIRECTIVES: Not in place   HEALTH MAINTENANCE: Social History   Tobacco Use  . Smoking status: Never Smoker  .  Smokeless tobacco: Never Used  Substance Use Topics  . Alcohol use: Yes    Comment: social  . Drug use: No     Colonoscopy: Never  PAP: December 2017  Bone density: Never   No Known Allergies  Current Outpatient Medications  Medication Sig Dispense Refill  . acetaminophen (TYLENOL) 500 MG tablet Take 1,000 mg by mouth every 6 (six) hours as needed (for pain/headaches.).    Marland Kitchen B Complex Vitamins (VITAMIN B COMPLEX PO) Take 1 capsule by mouth 2 (two) times daily.     . benzonatate (TESSALON) 100 MG capsule Take 100 mg by mouth every 8 (eight) hours as needed for cough.   0  . Calcium Carb-Cholecalciferol (CALCIUM 600+D3 PO) Take 1 tablet by mouth 2 (two) times daily.    . capecitabine (XELODA) 500 MG tablet Take 2 tablets ('1000mg'$ ) by mouth twice daily, within 30 minutes after meals. Take on days of radiation only, M-F 140 tablet 0  . cetirizine (ZYRTEC) 10 MG tablet Take 10 mg by mouth daily.      . Cholecalciferol (VITAMIN D-3) 5000 units TABS Take 5,000 Units by mouth 2 (two) times daily.    Marland Kitchen ibuprofen (ADVIL,MOTRIN) 200 MG tablet Take 800 mg by mouth every 8 (eight) hours as needed (for pain.).    Marland Kitchen  loratadine (CLARITIN) 10 MG tablet Take 1 tablet (10 mg total) by mouth daily. 90 tablet 2  . montelukast (SINGULAIR) 10 MG tablet Take 10 mg by mouth daily as needed (for allergies.).     Marland Kitchen Multiple Vitamin (MULTIVITAMIN WITH MINERALS) TABS tablet Take 1 tablet by mouth daily.    . Omega-3 Fatty Acids (FISH OIL) 1000 MG CAPS Take 2,000 mg by mouth daily.     Marland Kitchen PREVIDENT 5000 BOOSTER PLUS 1.1 % PSTE BRUSH ONCE A DAY IN PLACE OF REGULAR TOOTHPASTE  2  . prochlorperazine (COMPAZINE) 10 MG tablet Take 10 mg by mouth every 6 (six) hours as needed for nausea or vomiting.   1  . senna (SENOKOT) 8.6 MG TABS tablet Take 1 tablet (8.6 mg total) by mouth 2 (two) times daily. 120 each 0   No current facility-administered medications for this visit.    Facility-Administered Medications Ordered in  Other Visits  Medication Dose Route Frequency Provider Last Rate Last Dose  . sodium chloride flush (NS) 0.9 % injection 10 mL  10 mL Intravenous PRN Magrinat, [name], MD   10 mL at 03/10/17 1239    OBJECTIVE:  Vitals:   12/14/17 1155  BP: (!) 148/79  Pulse: 76  Resp: 18  Temp: 98.5 F (36.9 C)  SpO2: 100%     Body mass index is 43.78 kg/m.   Wt Readings from Last 3 Encounters:  12/14/17 279 lb 8 oz (126.8 kg)  11/23/17 277 lb 3.2 oz (125.7 kg)  11/16/17 273 lb 3.2 oz (123.9 kg)   ECOG FS: 1 - Symptomatic but completely ambulatory GENERAL: Patient is a well appearing female in no acute distress HEENT:  Sclerae anicteric.  Oropharynx clear and moist. No ulcerations or evidence of oropharyngeal candidiasis. Neck is supple.  NODES:  No cervical, supraclavicular, or axillary lymphadenopathy palpated.  BREAST EXAM:  Left mastectomy site with hyperpigmentation and slight erythema, dry desquamation noted in left axilla, and left cw MAL. LUNGS:  Clear to auscultation bilaterally.  No wheezes or rhonchi. HEART:  Regular rate and rhythm. No murmur appreciated. ABDOMEN:  Soft, nontender.  Positive, normoactive bowel sounds. No organomegaly palpated. MSK:  No focal spinal tenderness to palpation. Full range of motion bilaterally in the upper extremities. EXTREMITIES:  No peripheral edema.   SKIN:  Clear with no obvious rashes or skin changes. No nail dyscrasia. NEURO:  Nonfocal. Well oriented.  Appropriate affect.    LAB RESULTS:    Appointment on 12/14/2017  Component Date Value Ref Range Status  . Sodium 12/14/2017 138  135 - 145 mmol/L Final   Please note reference intervals were recently updated.  . Potassium 12/14/2017 3.9  3.5 - 5.1 mmol/L Final  . Chloride 12/14/2017 106  98 - 111 mmol/L Final  . CO2 12/14/2017 29  22 - 32 mmol/L Final  . Glucose, Bld 12/14/2017 97  70 - 99 mg/dL Final  . BUN 12/14/2017 18  6 - 20 mg/dL Final   Please note change in reference range.  .  Creatinine, Ser 12/14/2017 0.82  0.44 - 1.00 mg/dL Final  . Calcium 12/14/2017 9.4  8.9 - 10.3 mg/dL Final  . Total Protein 12/14/2017 7.2  6.5 - 8.1 g/dL Final  . Albumin 12/14/2017 3.6  3.5 - 5.0 g/dL Final  . AST 12/14/2017 13* 15 - 41 U/L Final  . ALT 12/14/2017 16  0 - 44 U/L Final  . Alkaline Phosphatase 12/14/2017 144* 38 - 126 U/L Final  .  Total Bilirubin 12/14/2017 0.2* 0.3 - 1.2 mg/dL Final  . GFR calc non Af Amer 12/14/2017 >60  >60 mL/min Final  . GFR calc Af Amer 12/14/2017 >60  >60 mL/min Final   Comment: (NOTE) The eGFR has been calculated using the CKD EPI equation. This calculation has not been validated in all clinical situations. eGFR's persistently <60 mL/min signify possible Chronic Kidney Disease.   Georgiann Hahn gap 12/14/2017 3* 5 - 15 Final   Performed at [facility], [place] [address], [place], [place] [address]  . WBC 12/14/2017 5.3  3.9 - 10.3 K/uL Final  . RBC 12/14/2017 4.14  3.70 - 5.45 MIL/uL Final  . Hemoglobin 12/14/2017 11.0* 11.6 - 15.9 g/dL Final  . HCT 12/14/2017 33.7* 34.8 - 46.6 % Final  . MCV 12/14/2017 81.3  79.5 - 101.0 fL Final  . MCH 12/14/2017 26.7  25.1 - 34.0 pg Final  . MCHC 12/14/2017 32.8  31.5 - 36.0 g/dL Final  . RDW 12/14/2017 17.4* 11.2 - 14.5 % Final  . Platelets 12/14/2017 223  145 - 400 K/uL Final  . Neutrophils Relative % 12/14/2017 77  % Final  . Neutro Abs 12/14/2017 4.1  1.5 - 6.5 K/uL Final  . Lymphocytes Relative 12/14/2017 11  % Final  . Lymphs Abs 12/14/2017 0.6* 0.9 - 3.3 K/uL Final  . Monocytes Relative 12/14/2017 11  % Final  . Monocytes Absolute 12/14/2017 0.6  0.1 - 0.9 K/uL Final  . Eosinophils Relative 12/14/2017 1  % Final  . Eosinophils Absolute 12/14/2017 0.0  0.0 - 0.5 K/uL Final  . Basophils Relative 12/14/2017 0  % Final  . Basophils Absolute 12/14/2017 0.0  0.0 - 0.1 K/uL Final   Performed at [facility], [place] [address] [place], [place] [address]        STUDIES: No results found.   ELIGIBLE FOR AVAILABLE RESEARCH PROTOCOL: no  ASSESSMENT: 47 y.o. [place] woman status post left breast upper inner quadrant biopsy 01/14/2017 for a clinical T3 N2, stage IIA invasive ductal carcinoma, grade 1 or 2, estrogen and progesterone receptor positive, HER-2 nonamplified, with an MIB-1 of 20%.  (1) staging studies: Brain MRI, bone scan, and CT scan of the chest 02/05/2017 showed no brain lesions, no lung or liver lesions, a 4.9 cm mass in the left breast with left axillary and subpectoral adenopathy, and nonspecific bone scan tracer at L2, left scapula, and anterior ribs, with lumbar spine MRI suggested for further evaluation.  (a) lumbar spine MRI 02/17/2017 showed no normal bone lesions.  There was mild lumbar spondylosis  (2) neoadjuvant chemotherapy consisting of cyclophosphamide and doxorubicin in dose dense fashion 4 started 02/10/2017, completed 03/24/2017, followed by weekly carboplatin and gemcitabine given days 1 and 8 of each 21-day cycle starting 04/14/2017, completing the planned 4 cycles 06/26/2017  (3) is post left modified radical mastectomy on 08/27/2017 showing an mpT3 pN2 residual invasive ductal carcinoma, grade 2, with a residual cancer burden of 3.  Margins were clear  (a) TRAM flap reconstruction planned  (4) postmastectomy radiation to finish 01/01/2018  (a) capecitabine radiosensitization started 11/16/2017  (5) goserelin started 09/21/2017  (a) letrozole to start at the completion of radiation  (6) genetics testing 03/24/2017 through the Common Hereditary Cancer Panel offered by [facility] found no deleterious mutations in APC, ATM, AXIN2, BARD1, BMPR1A, BRCA1, BRCA2, BRIP1, CDH1, CDKN2A (p14ARF), CDKN2A (p16INK4a), CHEK2, CTNNA1, DICER1, EPCAM (Deletion/duplication testing only), GREM1 (promoter region deletion/duplication testing only), KIT, MEN1, MLH1,  MSH2, MSH3, MSH6, MUTYH, NBN, NF1, NHTL1, PALB2, PDGFRA, PMS2, POLD1,  POLE, PTEN, RAD50, RAD51C, RAD51D, SDHB, SDHC, SDHD, SMAD4, SMARCA4. STK11, TP53, TSC1, TSC2, and VHL.  The following genes were evaluated for sequence changes only: SDHA and HOXB13 c.251G>A variant only.    PLAN:  Mackenzie [name] is doing well today.  She continues on Capecitabine 2 tablets BID on radiation days.  She is starting to have some skin breakdown due to radiation and will follow up with the folks downstairs about her skin care.    She also will receive Goserelin today and is tolerating this well.  She and I reviewed that she will be due to start Letrozole in about 4 weeks when I see her again and her radiation is complete.  I gave her detailed information about Letrozole in her AVS.  We also reviewed Goserelin injections versus surgical menopause with BSO.    Mackenzie [name] will return in 4 weeks for labs, f/u, and her next injection.  She knows to call for any other issues that may develop before the next visit.  A total of (30) minutes of face-to-face time was spent with this patient with greater than 50% of that time in counseling and care-coordination.  [name], NP  12/14/17 1:09 PM Medical Oncology and Hematology [facility] [address] [place], [place] [address] Tel. [phone]    Fax. [phone]

## 2017-12-14 NOTE — Patient Instructions (Signed)
Letrozole tablets What is this medicine? LETROZOLE (LET roe zole) blocks the production of estrogen. It is used to treat breast cancer. This medicine may be used for other purposes; ask your health care provider or pharmacist if you have questions. COMMON BRAND NAME(S): Femara What should I tell my health care provider before I take this medicine? They need to know if you have any of these conditions: -high cholesterol -liver disease -osteoporosis (weak bones) -an unusual or allergic reaction to letrozole, other medicines, foods, dyes, or preservatives -pregnant or trying to get pregnant -breast-feeding How should I use this medicine? Take this medicine by mouth with a glass of water. You may take it with or without food. Follow the directions on the prescription label. Take your medicine at regular intervals. Do not take your medicine more often than directed. Do not stop taking except on your doctor's advice. Talk to your pediatrician regarding the use of this medicine in children. Special care may be needed. Overdosage: If you think you have taken too much of this medicine contact a poison control center or emergency room at once. NOTE: This medicine is only for you. Do not share this medicine with others. What if I miss a dose? If you miss a dose, take it as soon as you can. If it is almost time for your next dose, take only that dose. Do not take double or extra doses. What may interact with this medicine? Do not take this medicine with any of the following medications: -estrogens, like hormone replacement therapy or birth control pills This medicine may also interact with the following medications: -dietary supplements such as androstenedione or DHEA -prasterone -tamoxifen This list may not describe all possible interactions. Give your health care provider a list of all the medicines, herbs, non-prescription drugs, or dietary supplements you use. Also tell them if you smoke, drink  alcohol, or use illegal drugs. Some items may interact with your medicine. What should I watch for while using this medicine? Tell your doctor or healthcare professional if your symptoms do not start to get better or if they get worse. Do not become pregnant while taking this medicine or for 3 weeks after stopping it. Women should inform their doctor if they wish to become pregnant or think they might be pregnant. There is a potential for serious side effects to an unborn child. Talk to your health care professional or pharmacist for more information. Do not breast-feed while taking this medicine or for 3 weeks after stopping it. This medicine may interfere with the ability to have a child. Talk with your doctor or health care professional if you are concerned about your fertility. Using this medicine for a long time may increase your risk of low bone mass. Talk to your doctor about bone health. You may get drowsy or dizzy. Do not drive, use machinery, or do anything that needs mental alertness until you know how this medicine affects you. Do not stand or sit up quickly, especially if you are an older patient. This reduces the risk of dizzy or fainting spells. You may need blood work done while you are taking this medicine. What side effects may I notice from receiving this medicine? Side effects that you should report to your doctor or health care professional as soon as possible: -allergic reactions like skin rash, itching, or hives -bone fracture -chest pain -signs and symptoms of a blood clot such as breathing problems; changes in vision; chest pain; severe, sudden headache; pain, swelling,   warmth in the leg; trouble speaking; sudden numbness or weakness of the face, arm or leg -vaginal bleeding Side effects that usually do not require medical attention (report to your doctor or health care professional if they continue or are bothersome): -bone, back, joint, or muscle  pain -dizziness -fatigue -fluid retention -headache -hot flashes, night sweats -nausea -weight gain This list may not describe all possible side effects. Call your doctor for medical advice about side effects. You may report side effects to FDA at 1-800-FDA-1088. Where should I keep my medicine? Keep out of the reach of children. Store between 15 and 30 degrees C (59 and 86 degrees F). Throw away any unused medicine after the expiration date. NOTE: This sheet is a summary. It may not cover all possible information. If you have questions about this medicine, talk to your doctor, pharmacist, or health care provider.  2018 Elsevier/Gold Standard (2015-12-31 11:10:41)  

## 2017-12-14 NOTE — Telephone Encounter (Signed)
Per 7/8 no los

## 2017-12-15 ENCOUNTER — Other Ambulatory Visit: Payer: Self-pay | Admitting: Pharmacist

## 2017-12-15 ENCOUNTER — Ambulatory Visit
Admission: RE | Admit: 2017-12-15 | Discharge: 2017-12-15 | Disposition: A | Discharge status: Home / Self Care | Payer: 59 | Source: Ambulatory Visit | Attending: Radiation Oncology | Admitting: Radiation Oncology

## 2017-12-15 ENCOUNTER — Ambulatory Visit: Payer: 59

## 2017-12-15 DIAGNOSIS — Z9189 Other specified personal risk factors, not elsewhere classified: Secondary | ICD-10-CM | POA: Diagnosis not present

## 2017-12-15 DIAGNOSIS — Z170 Estrogen receptor positive status [ER+]: Secondary | ICD-10-CM | POA: Diagnosis not present

## 2017-12-15 DIAGNOSIS — C50312 Malignant neoplasm of lower-inner quadrant of left female breast: Secondary | ICD-10-CM | POA: Diagnosis not present

## 2017-12-15 DIAGNOSIS — Z483 Aftercare following surgery for neoplasm: Secondary | ICD-10-CM | POA: Diagnosis not present

## 2017-12-15 DIAGNOSIS — L819 Disorder of pigmentation, unspecified: Secondary | ICD-10-CM | POA: Diagnosis not present

## 2017-12-15 DIAGNOSIS — M25612 Stiffness of left shoulder, not elsewhere classified: Secondary | ICD-10-CM

## 2017-12-15 DIAGNOSIS — Z510 Encounter for antineoplastic radiation therapy: Secondary | ICD-10-CM | POA: Diagnosis not present

## 2017-12-15 DIAGNOSIS — C50812 Malignant neoplasm of overlapping sites of left female breast: Secondary | ICD-10-CM | POA: Diagnosis not present

## 2017-12-15 NOTE — Therapy (Signed)
[place], [place], [address] Phone: [phone]   Fax:  [phone]  Physical Therapy Treatment  Patient Details  Name: Mackenzie [name] MRN: [id number] Date of Birth: 11-01-1970 Referring Provider: Shona Simpson, PA-C   Encounter Date: 12/15/2017  PT End of Session - 12/15/17 1005    Visit Number  16    Number of Visits  22    Date for PT Re-Evaluation  01/06/18    PT Start Time  0943 pt arrived late    PT Stop Time  1015    PT Time Calculation (min)  32 min    Activity Tolerance  Patient tolerated treatment well    Behavior During Therapy  [facility] for tasks assessed/performed       Past Medical History:  Diagnosis Date  . Anemia   . Breast cancer ([place]) 01/14/2017   Left breast  . Diabetes mellitus    GDM    Past Surgical History:  Procedure Laterality Date  . BREAST RECONSTRUCTION WITH PLACEMENT OF TISSUE EXPANDER AND FLEX HD (ACELLULAR HYDRATED DERMIS) Left 08/27/2017   Procedure: LEFT BREAST RECONSTRUCTION WITH PLACEMENT OF TISSUE EXPANDER AND FLEX HD;  Surgeon: Wallace Going, DO;  Location: [facility];  Service: Plastics;  Laterality: Left;  . CESAREAN SECTION     x2  . MASTECTOMY MODIFIED RADICAL Left 08/27/2017  . MASTECTOMY MODIFIED RADICAL Left 08/27/2017   Procedure: LEFT MODIFIED RADICAL MASTECTOMY;  Surgeon: Erroll Luna, MD;  Location: [facility];  Service: General;  Laterality: Left;  . PORT-A-CATH REMOVAL Right 08/27/2017   Procedure: REMOVAL PORT-A-CATH RIGHT CHEST;  Surgeon: Erroll Luna, MD;  Location: [facility];  Service: General;  Laterality: Right;  . PORTACATH PLACEMENT Right 01/28/2017   Procedure: INSERTION PORT-A-CATH WITH ULTRASOUND;  Surgeon: Erroll Luna, MD;  Location: [facility];  Service: General;  Laterality: Right;  . TUBAL LIGATION      There were no vitals filed for this visit.  Subjective Assessment - 12/15/17 0948    Subjective  I'm  starting to get red and tender from radiation. My skin is starting peel as well. I think I want to just finish this week of appointments and D/C from therapy. I'm doing better overall and start back to work part time tomorrow.     Pertinent History  Left breast cancer diagnosed 01/10/17 by mammogram and needle biopsy. Then had neo-adjuvant chemo starting 02/10/17 until 06/26/17; had left mastectomy 08/27/17 with ALND and immediate expander placed. Has had 6 fills. Still has her drain and Dr. Marla Roe is monitoring that. Will have radiation for 33 visits but it's not scheduled yet. Will have lat flap surgery after radiation. Allergies seasonal and all the time.    Patient Stated Goals  get ROM, strength back; avoid lymphedema    Currently in Pain?  No/denies                       [facility] Adult PT Treatment/Exercise - 12/15/17 0001      Shoulder Exercises: Standing   Other Standing Exercises  Began instruction of Strength ABC Program with pt returning therapist demonstration      Shoulder Exercises: Pulleys   ABduction  2 minutes 2 lbs on wrist      Shoulder Exercises: Therapy Ball   Flexion  Both;10 reps 2 lbs on wrist                  PT Long  Term Goals - 12/03/17 1033      PT LONG TERM GOAL #1   Title  Pt. will be independent in HEP for shoulder ROM    Status  Achieved      PT LONG TERM GOAL #2   Title  Pt. will be able to get left arm into position for radiation treatment.    Status  Achieved      PT LONG TERM GOAL #3   Title  left shoulder flexion to 130 degrees    Baseline  right goes to this point at eval; left to 120 on 11/04/17; 127 on 11/09/17; 128 on 12/03/17    Status  Partially Met      PT LONG TERM GOAL #4   Title  left shoulder abduction to 140 degrees    Baseline  right goes to this point at eval; left to 90 on 11/04/17; 107 on 11/09/17; 128 on 12/03/17    Status  Partially Met            Plan - 12/15/17 1005    Clinical Impression Statement   Pt arrived late again today. Began instruction if Strength ABC Program as time allowed (got through one arm rows).      Rehab Potential  Good    Clinical Impairments Affecting Rehab Potential  drain was removed 10/23/17; started radiation 11/16/17    PT Frequency  2x / week    PT Duration  2 weeks to 4 weeks prn    PT Treatment/Interventions  ADLs/Self Care Home Management;Therapeutic exercise;Patient/family education;Manual techniques;Manual lymph drainage;Compression bandaging;Scar mobilization;Passive range of motion    PT Next Visit Plan  Complete instruction of Strenght ABC and D/C    Consulted and Agree with Plan of Care  Patient       Patient will benefit from skilled therapeutic intervention in order to improve the following deficits and impairments:  Decreased range of motion, Impaired UE functional use, Decreased strength, Other (comment)  Visit Diagnosis: Aftercare following surgery for neoplasm  Stiffness of left shoulder, not elsewhere classified  At risk for lymphedema     Problem List Patient Active Problem List   Diagnosis Date Noted  . Morbid obesity with body mass index (BMI) of 40.0 to 49.9 ([place]) 09/17/2017  . Genetic testing 03/25/2017  . Port catheter in place 02/10/2017  . Malignant 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, estrogen receptor positive ([place]) 01/23/2017  . 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

## 2017-12-16 ENCOUNTER — Ambulatory Visit
Admission: RE | Admit: 2017-12-16 | Discharge: 2017-12-16 | Disposition: A | Discharge status: Home / Self Care | Payer: 59 | Source: Ambulatory Visit | Attending: Radiation Oncology | Admitting: Radiation Oncology

## 2017-12-16 DIAGNOSIS — L819 Disorder of pigmentation, unspecified: Secondary | ICD-10-CM | POA: Diagnosis not present

## 2017-12-16 DIAGNOSIS — C50812 Malignant neoplasm of overlapping sites of left female breast: Secondary | ICD-10-CM | POA: Diagnosis not present

## 2017-12-16 DIAGNOSIS — Z170 Estrogen receptor positive status [ER+]: Secondary | ICD-10-CM | POA: Diagnosis not present

## 2017-12-16 DIAGNOSIS — C50312 Malignant neoplasm of lower-inner quadrant of left female breast: Secondary | ICD-10-CM | POA: Diagnosis not present

## 2017-12-16 DIAGNOSIS — Z510 Encounter for antineoplastic radiation therapy: Secondary | ICD-10-CM | POA: Diagnosis not present

## 2017-12-17 ENCOUNTER — Ambulatory Visit
Admission: RE | Admit: 2017-12-17 | Discharge: 2017-12-17 | Disposition: A | Discharge status: Home / Self Care | Payer: 59 | Source: Ambulatory Visit | Attending: Radiation Oncology | Admitting: Radiation Oncology

## 2017-12-17 ENCOUNTER — Ambulatory Visit: Payer: 59

## 2017-12-17 DIAGNOSIS — Z483 Aftercare following surgery for neoplasm: Secondary | ICD-10-CM | POA: Diagnosis not present

## 2017-12-17 DIAGNOSIS — Z9189 Other specified personal risk factors, not elsewhere classified: Secondary | ICD-10-CM

## 2017-12-17 DIAGNOSIS — M25612 Stiffness of left shoulder, not elsewhere classified: Secondary | ICD-10-CM | POA: Diagnosis not present

## 2017-12-17 DIAGNOSIS — Z170 Estrogen receptor positive status [ER+]: Secondary | ICD-10-CM | POA: Diagnosis not present

## 2017-12-17 DIAGNOSIS — C50812 Malignant neoplasm of overlapping sites of left female breast: Secondary | ICD-10-CM | POA: Diagnosis not present

## 2017-12-17 DIAGNOSIS — L819 Disorder of pigmentation, unspecified: Secondary | ICD-10-CM | POA: Diagnosis not present

## 2017-12-17 DIAGNOSIS — C50312 Malignant neoplasm of lower-inner quadrant of left female breast: Secondary | ICD-10-CM | POA: Diagnosis not present

## 2017-12-17 DIAGNOSIS — Z510 Encounter for antineoplastic radiation therapy: Secondary | ICD-10-CM | POA: Diagnosis not present

## 2017-12-17 MED FILL — LIDOCAINE-PRILOCAINE CREAM: 2.5-2.5 | 30 days supply | Qty: 30 | Fill #1

## 2017-12-17 NOTE — Therapy (Signed)
[facility] [address] [place], [place], [address] Phone: [phone]   Fax:  [phone]  Physical Therapy Treatment  Patient Details  Name: Mackenzie [name] MRN: [id number] Date of Birth: 05/08/1971 Referring Provider: Alison Perkins, PA-C   Encounter Date: 12/17/2017  PT End of Session - 12/17/17 0950    Visit Number  17    Number of Visits  22    Date for PT Re-Evaluation  01/06/18    PT Start Time  0935    PT Stop Time  1016    PT Time Calculation (min)  41 min    Activity Tolerance  Patient tolerated treatment well    Behavior During Therapy  WFL for tasks assessed/performed       Past Medical History:  Diagnosis Date  . Anemia   . Breast cancer ([facility]) 01/14/2017   Left breast  . Diabetes mellitus    GDM    Past Surgical History:  Procedure Laterality Date  . BREAST RECONSTRUCTION WITH PLACEMENT OF TISSUE EXPANDER AND FLEX HD (ACELLULAR HYDRATED DERMIS) Left 08/27/2017   Procedure: LEFT BREAST RECONSTRUCTION WITH PLACEMENT OF TISSUE EXPANDER AND FLEX HD;  Surgeon: Dillingham, Claire S, DO;  [facility];  Service: Plastics;  Laterality: Left;  . CESAREAN SECTION     x2  . MASTECTOMY MODIFIED RADICAL Left 08/27/2017  . MASTECTOMY MODIFIED RADICAL Left 08/27/2017   Procedure: LEFT MODIFIED RADICAL MASTECTOMY;  Surgeon: Cornett, Thomas, MD;  Location: [facility];  Service: General;  Laterality: Left;  . PORT-A-CATH REMOVAL Right 08/27/2017   Procedure: REMOVAL PORT-A-CATH RIGHT CHEST;  Surgeon: Cornett, Thomas, MD;  Location: [facility];  Service: General;  Laterality: Right;  . PORTACATH PLACEMENT Right 01/28/2017   Procedure: INSERTION PORT-A-CATH WITH ULTRASOUND;  Surgeon: Cornett, Thomas, MD;  Location: [facility];  Service: General;  Laterality: Right;  . TUBAL LIGATION      There were no vitals filed for this visit.  Subjective Assessment - 12/17/17 0937    Subjective  Doing well, ready to make  today my last day. Yesterday was my first day back at work and I just did up the D/C for the pts leaving the hospital. That went well.     Pertinent History  Left breast cancer diagnosed 01/10/17 by mammogram and needle biopsy. Then had neo-adjuvant chemo starting 02/10/17 until 06/26/17; had left mastectomy 08/27/17 with ALND and immediate expander placed. Has had 6 fills. Still has her drain and Dr. Dillingham is monitoring that. Will have radiation for 33 visits but it's not scheduled yet. Will have lat flap surgery after radiation. Allergies seasonal and all the time.    Patient Stated Goals  get ROM, strength back; avoid lymphedema    Currently in Pain?  No/denies         [facility] PT Assessment - 12/17/17 0001      AROM   Left Shoulder Flexion  140 Degrees    Left Shoulder ABduction  144 Degrees                   [facility] Adult PT Treatment/Exercise - 12/17/17 0001      Shoulder Exercises: Standing   Other Standing Exercises  Completed instruction of Strength ABC Program starting with dead lifts and using 2 lbs throughout performing all 10 reps each with pt returning therapist demonstration      Shoulder Exercises: Pulleys   Flexion  2 minutes 2 lbs on wrist    ABduction    2 minutes 2 lbs on wrist    ABduction Limitations  VCs to decrease Lt scapular compensation throughout      Shoulder Exercises: Therapy Ball   Flexion  Both;10 reps 2 lbs on wrist; forward lean into end of stretch    ABduction  Left;10 reps 2 lbs on wrist; same side lean into end of stretch    ABduction Limitations  VCs to decrease Lt scapular compensation      Manual Therapy   Myofascial Release  left UE myofascial pulling with movement into abduction    Passive ROM  in supine for left shoulder stetch into abduction, flexion, and positioning for radiation             PT Education - 12/17/17 0949    Education provided  Yes    Education Details  Completed instruction of Strength ABC Program     Person(s) Educated  Patient    Methods  Explanation;Demonstration;Handout    Comprehension  Verbalized understanding;Returned demonstration          PT Long Term Goals - 12/17/17 0954      PT LONG TERM GOAL #1   Title  Pt. will be independent in HEP for shoulder ROM    Status  Achieved      PT LONG TERM GOAL #2   Title  Pt. will be able to get left arm into position for radiation treatment.    Baseline  Pt reports hvaing no trouble with this-12/17/17    Status  Achieved      PT LONG TERM GOAL #3   Title  left shoulder flexion to 130 degrees    Baseline  right goes to this point at eval; left to 120 on 11/04/17; 127 on 11/09/17; 128 on 12/03/17; 140 degrees-12/17/17    Status  Achieved      PT LONG TERM GOAL #4   Title  left shoulder abduction to 140 degrees    Baseline  right goes to this point at eval; left to 90 on 11/04/17; 107 on 11/09/17; 128 on 12/03/17; 144 degrees-12/17/17    Status  Achieved            Plan - 12/17/17 0951    Clinical Impression Statement  Was able to complete instruction of Strength ABC Program today and pt tolerated this well though did require mult VCs and dmeonstration for correct technique with dead lifts. She repotrs feeling challenged by these exercises. Pt has met all goals and is ready for D/C at this time.     Rehab Potential  Good    Clinical Impairments Affecting Rehab Potential  drain was removed 10/23/17; started radiation 11/16/17    PT Frequency  2x / week    PT Duration  2 weeks to 4 weeks prn    PT Treatment/Interventions  ADLs/Self Care Home Management;Therapeutic exercise;Patient/family education;Manual techniques;Manual lymph drainage;Compression bandaging;Scar mobilization;Passive range of motion    PT Next Visit Plan  D/ c this visit.    Consulted and Agree with Plan of Care  Patient       Patient will benefit from skilled therapeutic intervention in order to improve the following deficits and impairments:  Decreased range of  motion, Impaired UE functional use, Decreased strength, Other (comment)  Visit Diagnosis: Aftercare following surgery for neoplasm  Stiffness of left shoulder, not elsewhere classified  At risk for lymphedema     Problem List Patient Active Problem List   Diagnosis Date Noted  . Morbid obesity with body  mass index (BMI) of 40.0 to 49.9 ([employer]) 09/17/2017  . Genetic testing 03/25/2017  . Port catheter in place 02/10/2017  . Malignant 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, estrogen receptor positive ([place]) 01/23/2017  . 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 See above.   Remaining deficits: None   Education / Equipment: HEP Plan: Patient agrees to discharge.  Patient goals were met. Patient is being discharged due to meeting the stated rehab goals.  ?????         Annia Friendly, [place] 12/17/17 12:52 PM

## 2017-12-18 ENCOUNTER — Ambulatory Visit
Admission: RE | Admit: 2017-12-18 | Discharge: 2017-12-18 | Disposition: A | Discharge status: Home / Self Care | Payer: 59 | Source: Ambulatory Visit | Attending: Radiation Oncology | Admitting: Radiation Oncology

## 2017-12-18 ENCOUNTER — Ambulatory Visit: Payer: 59 | Admitting: Radiation Oncology

## 2017-12-18 DIAGNOSIS — Z170 Estrogen receptor positive status [ER+]: Secondary | ICD-10-CM | POA: Diagnosis not present

## 2017-12-18 DIAGNOSIS — Z510 Encounter for antineoplastic radiation therapy: Secondary | ICD-10-CM | POA: Diagnosis not present

## 2017-12-18 DIAGNOSIS — C50312 Malignant neoplasm of lower-inner quadrant of left female breast: Secondary | ICD-10-CM | POA: Diagnosis not present

## 2017-12-18 DIAGNOSIS — L819 Disorder of pigmentation, unspecified: Secondary | ICD-10-CM | POA: Diagnosis not present

## 2017-12-18 DIAGNOSIS — C50812 Malignant neoplasm of overlapping sites of left female breast: Secondary | ICD-10-CM | POA: Diagnosis not present

## 2017-12-21 ENCOUNTER — Ambulatory Visit
Admission: RE | Admit: 2017-12-21 | Discharge: 2017-12-21 | Disposition: A | Discharge status: Home / Self Care | Payer: 59 | Source: Ambulatory Visit | Attending: Radiation Oncology | Admitting: Radiation Oncology

## 2017-12-21 DIAGNOSIS — L819 Disorder of pigmentation, unspecified: Secondary | ICD-10-CM | POA: Diagnosis not present

## 2017-12-21 DIAGNOSIS — C50312 Malignant neoplasm of lower-inner quadrant of left female breast: Secondary | ICD-10-CM | POA: Diagnosis not present

## 2017-12-21 DIAGNOSIS — Z510 Encounter for antineoplastic radiation therapy: Secondary | ICD-10-CM | POA: Diagnosis not present

## 2017-12-21 DIAGNOSIS — C50812 Malignant neoplasm of overlapping sites of left female breast: Secondary | ICD-10-CM | POA: Diagnosis not present

## 2017-12-21 DIAGNOSIS — C50912 Malignant neoplasm of unspecified site of left female breast: Secondary | ICD-10-CM

## 2017-12-21 DIAGNOSIS — Z170 Estrogen receptor positive status [ER+]: Secondary | ICD-10-CM | POA: Diagnosis not present

## 2017-12-21 DIAGNOSIS — C773 Secondary and unspecified malignant neoplasm of axilla and upper limb lymph nodes: Principal | ICD-10-CM

## 2017-12-21 MED ORDER — SONAFINE EX EMUL
1.0000 "application " | Freq: Two times a day (BID) | CUTANEOUS | Status: DC
  Administered 2017-12-21: 1 via TOPICAL

## 2017-12-21 NOTE — Progress Notes (Signed)
Pt skin was checked today. Pt skin had redness, peeling, tightness and tenderness. Pt was given sonafine lotion. Pt was advised to use neosporine along with the soanafine. Pt was advised to use a nonadherent dressing when wearing a top. Pt was advised to use the sonafine lotion after treatment and before bedtime.

## 2017-12-22 ENCOUNTER — Ambulatory Visit
Admission: RE | Admit: 2017-12-22 | Discharge: 2017-12-22 | Disposition: A | Discharge status: Home / Self Care | Payer: 59 | Source: Ambulatory Visit | Attending: Radiation Oncology | Admitting: Radiation Oncology

## 2017-12-22 DIAGNOSIS — C50812 Malignant neoplasm of overlapping sites of left female breast: Secondary | ICD-10-CM | POA: Diagnosis not present

## 2017-12-22 DIAGNOSIS — C50312 Malignant neoplasm of lower-inner quadrant of left female breast: Secondary | ICD-10-CM | POA: Diagnosis not present

## 2017-12-22 DIAGNOSIS — L819 Disorder of pigmentation, unspecified: Secondary | ICD-10-CM | POA: Diagnosis not present

## 2017-12-22 DIAGNOSIS — Z170 Estrogen receptor positive status [ER+]: Secondary | ICD-10-CM | POA: Diagnosis not present

## 2017-12-22 DIAGNOSIS — Z510 Encounter for antineoplastic radiation therapy: Secondary | ICD-10-CM | POA: Diagnosis not present

## 2017-12-23 ENCOUNTER — Ambulatory Visit
Admission: RE | Admit: 2017-12-23 | Discharge: 2017-12-23 | Disposition: A | Discharge status: Home / Self Care | Payer: 59 | Source: Ambulatory Visit | Attending: Radiation Oncology | Admitting: Radiation Oncology

## 2017-12-23 DIAGNOSIS — C50812 Malignant neoplasm of overlapping sites of left female breast: Secondary | ICD-10-CM | POA: Diagnosis not present

## 2017-12-23 DIAGNOSIS — Z510 Encounter for antineoplastic radiation therapy: Secondary | ICD-10-CM | POA: Diagnosis not present

## 2017-12-23 DIAGNOSIS — L819 Disorder of pigmentation, unspecified: Secondary | ICD-10-CM | POA: Diagnosis not present

## 2017-12-23 DIAGNOSIS — C50312 Malignant neoplasm of lower-inner quadrant of left female breast: Secondary | ICD-10-CM | POA: Diagnosis not present

## 2017-12-23 DIAGNOSIS — Z170 Estrogen receptor positive status [ER+]: Secondary | ICD-10-CM | POA: Diagnosis not present

## 2017-12-24 ENCOUNTER — Ambulatory Visit
Admission: RE | Admit: 2017-12-24 | Discharge: 2017-12-24 | Disposition: A | Discharge status: Home / Self Care | Payer: 59 | Source: Ambulatory Visit | Attending: Radiation Oncology | Admitting: Radiation Oncology

## 2017-12-24 DIAGNOSIS — L819 Disorder of pigmentation, unspecified: Secondary | ICD-10-CM | POA: Diagnosis not present

## 2017-12-24 DIAGNOSIS — C50312 Malignant neoplasm of lower-inner quadrant of left female breast: Secondary | ICD-10-CM | POA: Diagnosis not present

## 2017-12-24 DIAGNOSIS — Z170 Estrogen receptor positive status [ER+]: Secondary | ICD-10-CM | POA: Diagnosis not present

## 2017-12-24 DIAGNOSIS — Z510 Encounter for antineoplastic radiation therapy: Secondary | ICD-10-CM | POA: Diagnosis not present

## 2017-12-24 DIAGNOSIS — C50812 Malignant neoplasm of overlapping sites of left female breast: Secondary | ICD-10-CM | POA: Diagnosis not present

## 2017-12-24 MED ORDER — SONAFINE EX EMUL
1.0000 "application " | Freq: Once | CUTANEOUS | Status: AC
  Administered 2017-12-24: 1 via TOPICAL

## 2017-12-25 ENCOUNTER — Ambulatory Visit
Admission: RE | Admit: 2017-12-25 | Discharge: 2017-12-25 | Disposition: A | Discharge status: Home / Self Care | Payer: 59 | Source: Ambulatory Visit | Attending: Radiation Oncology | Admitting: Radiation Oncology

## 2017-12-25 ENCOUNTER — Ambulatory Visit: Payer: 59

## 2017-12-25 ENCOUNTER — Ambulatory Visit: Payer: 59 | Admitting: Radiation Oncology

## 2017-12-25 DIAGNOSIS — Z170 Estrogen receptor positive status [ER+]: Secondary | ICD-10-CM | POA: Diagnosis not present

## 2017-12-25 DIAGNOSIS — L819 Disorder of pigmentation, unspecified: Secondary | ICD-10-CM | POA: Diagnosis not present

## 2017-12-25 DIAGNOSIS — C50312 Malignant neoplasm of lower-inner quadrant of left female breast: Secondary | ICD-10-CM | POA: Diagnosis not present

## 2017-12-25 DIAGNOSIS — C50812 Malignant neoplasm of overlapping sites of left female breast: Secondary | ICD-10-CM | POA: Diagnosis not present

## 2017-12-25 DIAGNOSIS — Z510 Encounter for antineoplastic radiation therapy: Secondary | ICD-10-CM | POA: Diagnosis not present

## 2017-12-28 ENCOUNTER — Ambulatory Visit
Admission: RE | Admit: 2017-12-28 | Discharge: 2017-12-28 | Disposition: A | Discharge status: Home / Self Care | Payer: 59 | Source: Ambulatory Visit | Attending: Radiation Oncology | Admitting: Radiation Oncology

## 2017-12-28 ENCOUNTER — Ambulatory Visit: Payer: 59

## 2017-12-28 DIAGNOSIS — C50812 Malignant neoplasm of overlapping sites of left female breast: Secondary | ICD-10-CM | POA: Diagnosis not present

## 2017-12-28 DIAGNOSIS — Z170 Estrogen receptor positive status [ER+]: Secondary | ICD-10-CM | POA: Diagnosis not present

## 2017-12-28 DIAGNOSIS — Z510 Encounter for antineoplastic radiation therapy: Secondary | ICD-10-CM | POA: Diagnosis not present

## 2017-12-28 DIAGNOSIS — L819 Disorder of pigmentation, unspecified: Secondary | ICD-10-CM | POA: Diagnosis not present

## 2017-12-28 DIAGNOSIS — C50312 Malignant neoplasm of lower-inner quadrant of left female breast: Secondary | ICD-10-CM | POA: Diagnosis not present

## 2017-12-29 ENCOUNTER — Ambulatory Visit
Admission: RE | Admit: 2017-12-29 | Discharge: 2017-12-29 | Disposition: A | Discharge status: Home / Self Care | Payer: 59 | Source: Ambulatory Visit | Attending: Radiation Oncology | Admitting: Radiation Oncology

## 2017-12-29 ENCOUNTER — Ambulatory Visit: Payer: 59

## 2017-12-29 DIAGNOSIS — C50812 Malignant neoplasm of overlapping sites of left female breast: Secondary | ICD-10-CM | POA: Diagnosis not present

## 2017-12-29 DIAGNOSIS — C50312 Malignant neoplasm of lower-inner quadrant of left female breast: Secondary | ICD-10-CM | POA: Diagnosis not present

## 2017-12-29 DIAGNOSIS — Z170 Estrogen receptor positive status [ER+]: Principal | ICD-10-CM

## 2017-12-29 DIAGNOSIS — L819 Disorder of pigmentation, unspecified: Secondary | ICD-10-CM | POA: Diagnosis not present

## 2017-12-29 DIAGNOSIS — Z510 Encounter for antineoplastic radiation therapy: Secondary | ICD-10-CM | POA: Diagnosis not present

## 2017-12-29 MED ORDER — SONAFINE EX EMUL
1.0000 "application " | Freq: Two times a day (BID) | CUTANEOUS | Status: DC
  Administered 2017-12-29: 1 via TOPICAL

## 2017-12-30 ENCOUNTER — Ambulatory Visit
Admission: RE | Admit: 2017-12-30 | Discharge: 2017-12-30 | Disposition: A | Discharge status: Home / Self Care | Payer: 59 | Source: Ambulatory Visit | Attending: Radiation Oncology | Admitting: Radiation Oncology

## 2017-12-30 DIAGNOSIS — L819 Disorder of pigmentation, unspecified: Secondary | ICD-10-CM | POA: Diagnosis not present

## 2017-12-30 DIAGNOSIS — Z510 Encounter for antineoplastic radiation therapy: Secondary | ICD-10-CM | POA: Diagnosis not present

## 2017-12-30 DIAGNOSIS — C50812 Malignant neoplasm of overlapping sites of left female breast: Secondary | ICD-10-CM | POA: Diagnosis not present

## 2017-12-30 DIAGNOSIS — Z170 Estrogen receptor positive status [ER+]: Secondary | ICD-10-CM | POA: Diagnosis not present

## 2017-12-31 ENCOUNTER — Ambulatory Visit: Payer: 59

## 2017-12-31 ENCOUNTER — Ambulatory Visit
Admission: RE | Admit: 2017-12-31 | Discharge: 2017-12-31 | Disposition: A | Discharge status: Home / Self Care | Payer: 59 | Source: Ambulatory Visit | Attending: Radiation Oncology | Admitting: Radiation Oncology

## 2017-12-31 DIAGNOSIS — Z170 Estrogen receptor positive status [ER+]: Secondary | ICD-10-CM | POA: Diagnosis not present

## 2017-12-31 DIAGNOSIS — Z510 Encounter for antineoplastic radiation therapy: Secondary | ICD-10-CM | POA: Diagnosis not present

## 2017-12-31 DIAGNOSIS — L819 Disorder of pigmentation, unspecified: Secondary | ICD-10-CM | POA: Diagnosis not present

## 2017-12-31 DIAGNOSIS — C50812 Malignant neoplasm of overlapping sites of left female breast: Secondary | ICD-10-CM | POA: Diagnosis not present

## 2018-01-01 ENCOUNTER — Ambulatory Visit
Admission: RE | Admit: 2018-01-01 | Discharge: 2018-01-01 | Disposition: A | Discharge status: Home / Self Care | Payer: 59 | Source: Ambulatory Visit | Attending: Radiation Oncology | Admitting: Radiation Oncology

## 2018-01-01 ENCOUNTER — Encounter: Payer: Self-pay | Admitting: Radiation Oncology

## 2018-01-01 DIAGNOSIS — L819 Disorder of pigmentation, unspecified: Secondary | ICD-10-CM | POA: Diagnosis not present

## 2018-01-01 DIAGNOSIS — C50312 Malignant neoplasm of lower-inner quadrant of left female breast: Secondary | ICD-10-CM | POA: Diagnosis not present

## 2018-01-01 DIAGNOSIS — C50812 Malignant neoplasm of overlapping sites of left female breast: Secondary | ICD-10-CM | POA: Diagnosis not present

## 2018-01-01 DIAGNOSIS — Z510 Encounter for antineoplastic radiation therapy: Secondary | ICD-10-CM | POA: Diagnosis not present

## 2018-01-01 DIAGNOSIS — Z170 Estrogen receptor positive status [ER+]: Secondary | ICD-10-CM | POA: Diagnosis not present

## 2018-01-11 ENCOUNTER — Ambulatory Visit: Payer: 59

## 2018-01-11 ENCOUNTER — Inpatient Hospital Stay: Payer: 59 | Attending: Adult Health | Admitting: Adult Health

## 2018-01-11 ENCOUNTER — Inpatient Hospital Stay: Payer: 59

## 2018-01-11 ENCOUNTER — Other Ambulatory Visit: Payer: Self-pay | Admitting: *Deleted

## 2018-01-11 ENCOUNTER — Telehealth: Payer: Self-pay | Admitting: Adult Health

## 2018-01-11 ENCOUNTER — Encounter: Payer: Self-pay | Admitting: *Deleted

## 2018-01-11 ENCOUNTER — Other Ambulatory Visit: Payer: 59

## 2018-01-11 ENCOUNTER — Encounter: Payer: Self-pay | Admitting: Adult Health

## 2018-01-11 VITALS — BP 134/83 | HR 96 | Temp 98.0°F | Resp 18 | Ht 67.0 in | Wt 276.3 lb

## 2018-01-11 DIAGNOSIS — C773 Secondary and unspecified malignant neoplasm of axilla and upper limb lymph nodes: Secondary | ICD-10-CM | POA: Insufficient documentation

## 2018-01-11 DIAGNOSIS — Z1239 Encounter for other screening for malignant neoplasm of breast: Secondary | ICD-10-CM

## 2018-01-11 DIAGNOSIS — E2839 Other primary ovarian failure: Secondary | ICD-10-CM

## 2018-01-11 DIAGNOSIS — Z170 Estrogen receptor positive status [ER+]: Principal | ICD-10-CM

## 2018-01-11 DIAGNOSIS — C50812 Malignant neoplasm of overlapping sites of left female breast: Secondary | ICD-10-CM | POA: Diagnosis not present

## 2018-01-11 DIAGNOSIS — R5383 Other fatigue: Secondary | ICD-10-CM

## 2018-01-11 DIAGNOSIS — Z9012 Acquired absence of left breast and nipple: Secondary | ICD-10-CM

## 2018-01-11 DIAGNOSIS — Z5111 Encounter for antineoplastic chemotherapy: Secondary | ICD-10-CM | POA: Diagnosis not present

## 2018-01-11 DIAGNOSIS — Z95828 Presence of other vascular implants and grafts: Secondary | ICD-10-CM

## 2018-01-11 LAB — CBC WITH DIFFERENTIAL/PLATELET
BASOS PCT: 0 %
Basophils Absolute: 0 10*3/uL (ref 0.0–0.1)
EOS ABS: 0.1 10*3/uL (ref 0.0–0.5)
EOS PCT: 2 %
HCT: 35 % (ref 34.8–46.6)
HEMOGLOBIN: 11.2 g/dL — AB (ref 11.6–15.9)
LYMPHS ABS: 0.5 10*3/uL — AB (ref 0.9–3.3)
Lymphocytes Relative: 10 %
MCH: 26.9 pg (ref 25.1–34.0)
MCHC: 32 g/dL (ref 31.5–36.0)
MCV: 84.1 fL (ref 79.5–101.0)
MONOS PCT: 14 %
Monocytes Absolute: 0.7 10*3/uL (ref 0.1–0.9)
NEUTROS PCT: 74 %
Neutro Abs: 3.8 10*3/uL (ref 1.5–6.5)
PLATELETS: 241 10*3/uL (ref 145–400)
RBC: 4.16 MIL/uL (ref 3.70–5.45)
RDW: 18.2 % — ABNORMAL HIGH (ref 11.2–14.5)
WBC: 5.1 10*3/uL (ref 3.9–10.3)

## 2018-01-11 LAB — COMPREHENSIVE METABOLIC PANEL
ALK PHOS: 127 U/L — AB (ref 38–126)
ALT: 22 U/L (ref 0–44)
AST: 22 U/L (ref 15–41)
Albumin: 3.6 g/dL (ref 3.5–5.0)
Anion gap: 10 (ref 5–15)
BUN: 15 mg/dL (ref 6–20)
CALCIUM: 9.2 mg/dL (ref 8.9–10.3)
CO2: 25 mmol/L (ref 22–32)
Chloride: 103 mmol/L (ref 98–111)
Creatinine, Ser: 0.9 mg/dL (ref 0.44–1.00)
Glucose, Bld: 110 mg/dL — ABNORMAL HIGH (ref 70–99)
Potassium: 3.7 mmol/L (ref 3.5–5.1)
Sodium: 138 mmol/L (ref 135–145)
Total Bilirubin: 0.5 mg/dL (ref 0.3–1.2)
Total Protein: 7.7 g/dL (ref 6.5–8.1)

## 2018-01-11 MED ORDER — LETROZOLE 2.5 MG PO TABS: 2.5000 mg | ORAL_TABLET | Freq: Every day | ORAL | 5 refills | Status: DC

## 2018-01-11 MED ORDER — GOSERELIN ACETATE 3.6 MG ~~LOC~~ IMPL
3.6000 mg | DRUG_IMPLANT | Freq: Once | SUBCUTANEOUS | Status: AC
  Administered 2018-01-11: 3.6 mg via SUBCUTANEOUS

## 2018-01-11 MED ORDER — GOSERELIN ACETATE 3.6 MG ~~LOC~~ IMPL
DRUG_IMPLANT | SUBCUTANEOUS | Status: AC
  Filled 2018-01-11: qty 3.6

## 2018-01-11 MED FILL — LETROZOLE 2.5 MG TABLET: 2.5 | 30 days supply | Qty: 30 | Fill #0

## 2018-01-11 NOTE — Patient Instructions (Signed)
Goserelin injection What is this medicine? GOSERELIN (GOE se rel in) is similar to a hormone found in the body. It lowers the amount of sex hormones that the body makes. Men will have lower testosterone levels and women will have lower estrogen levels while taking this medicine. In men, this medicine is used to treat prostate cancer; the injection is either given once per month or once every 12 weeks. A once per month injection (only) is used to treat women with endometriosis, dysfunctional uterine bleeding, or advanced breast cancer. This medicine may be used for other purposes; ask your health care provider or pharmacist if you have questions. COMMON BRAND NAME(S): Zoladex What should I tell my health care provider before I take this medicine? They need to know if you have any of these conditions (some only apply to women): -diabetes -heart disease or previous heart attack -high blood pressure -high cholesterol -kidney disease -osteoporosis or low bone density -problems passing urine -spinal cord injury -stroke -tobacco smoker -an unusual or allergic reaction to goserelin, hormone therapy, other medicines, foods, dyes, or preservatives -pregnant or trying to get pregnant -breast-feeding How should I use this medicine? This medicine is for injection under the skin. It is given by a health care professional in a hospital or clinic setting. Men receive this injection once every 4 weeks or once every 12 weeks. Women will only receive the once every 4 weeks injection. Talk to your pediatrician regarding the use of this medicine in children. Special care may be needed. Overdosage: If you think you have taken too much of this medicine contact a poison control center or emergency room at once. NOTE: This medicine is only for you. Do not share this medicine with others. What if I miss a dose? It is important not to miss your dose. Call your doctor or health care professional if you are unable to  keep an appointment. What may interact with this medicine? -female hormones like estrogen -herbal or dietary supplements like black cohosh, chasteberry, or DHEA -female hormones like testosterone -prasterone This list may not describe all possible interactions. Give your health care provider a list of all the medicines, herbs, non-prescription drugs, or dietary supplements you use. Also tell them if you smoke, drink alcohol, or use illegal drugs. Some items may interact with your medicine. What should I watch for while using this medicine? Visit your doctor or health care professional for regular checks on your progress. Your symptoms may appear to get worse during the first weeks of this therapy. Tell your doctor or healthcare professional if your symptoms do not start to get better or if they get worse after this time. Your bones may get weaker if you take this medicine for a long time. If you smoke or frequently drink alcohol you may increase your risk of bone loss. A family history of osteoporosis, chronic use of drugs for seizures (convulsions), or corticosteroids can also increase your risk of bone loss. Talk to your doctor about how to keep your bones strong. This medicine should stop regular monthly menstration in women. Tell your doctor if you continue to menstrate. Women should not become pregnant while taking this medicine or for 12 weeks after stopping this medicine. Women should inform their doctor if they wish to become pregnant or think they might be pregnant. There is a potential for serious side effects to an unborn child. Talk to your health care professional or pharmacist for more information. Do not breast-feed an infant while taking   this medicine. Men should inform their doctors if they wish to father a child. This medicine may lower sperm counts. Talk to your health care professional or pharmacist for more information. What side effects may I notice from receiving this  medicine? Side effects that you should report to your doctor or health care professional as soon as possible: -allergic reactions like skin rash, itching or hives, swelling of the face, lips, or tongue -bone pain -breathing problems -changes in vision -chest pain -feeling faint or lightheaded, falls -fever, chills -pain, swelling, warmth in the leg -pain, tingling, numbness in the hands or feet -signs and symptoms of low blood pressure like dizziness; feeling faint or lightheaded, falls; unusually weak or tired -stomach pain -swelling of the ankles, feet, hands -trouble passing urine or change in the amount of urine -unusually high or low blood pressure -unusually weak or tired Side effects that usually do not require medical attention (report to your doctor or health care professional if they continue or are bothersome): -change in sex drive or performance -changes in breast size in both males and females -changes in emotions or moods -headache -hot flashes -irritation at site where injected -loss of appetite -skin problems like acne, dry skin -vaginal dryness This list may not describe all possible side effects. Call your doctor for medical advice about side effects. You may report side effects to FDA at 1-800-FDA-1088. Where should I keep my medicine? This drug is given in a hospital or clinic and will not be stored at home. NOTE: This sheet is a summary. It may not cover all possible information. If you have questions about this medicine, talk to your doctor, pharmacist, or health care provider.  2018 Elsevier/Gold Standard (2013-08-02 11:10:35)  

## 2018-01-11 NOTE — Telephone Encounter (Signed)
Gave avs and calendar ° °

## 2018-01-11 NOTE — Patient Instructions (Signed)
Bone Health Bones protect organs, store calcium, and anchor muscles. Good health habits, such as eating nutritious foods and exercising regularly, are important for maintaining healthy bones. They can also help to prevent a condition that causes bones to lose density and become weak and brittle (osteoporosis). Why is bone mass important? Bone mass refers to the amount of bone tissue that you have. The higher your bone mass, the stronger your bones. An important step toward having healthy bones throughout life is to have strong and dense bones during childhood. A young adult who has a high bone mass is more likely to have a high bone mass later in life. Bone mass at its greatest it is called peak bone mass. A large decline in bone mass occurs in older adults. In women, it occurs about the time of menopause. During this time, it is important to practice good health habits, because if more bone is lost than what is replaced, the bones will become less healthy and more likely to break (fracture). If you find that you have a low bone mass, you may be able to prevent osteoporosis or further bone loss by changing your diet and lifestyle. How can I find out if my bone mass is low? Bone mass can be measured with an X-ray test that is called a bone mineral density (BMD) test. This test is recommended for all women who are age 65 or older. It may also be recommended for men who are age 70 or older, or for people who are more likely to develop osteoporosis due to:  Having bones that break easily.  Having a long-term disease that weakens bones, such as kidney disease or rheumatoid arthritis.  Having menopause earlier than normal.  Taking medicine that weakens bones, such as steroids, thyroid hormones, or hormone treatment for breast cancer or prostate cancer.  Smoking.  Drinking three or more alcoholic drinks each day.  What are the nutritional recommendations for healthy bones? To have healthy bones, you  need to get enough of the right minerals and vitamins. Most nutrition experts recommend getting these nutrients from the foods that you eat. Nutritional recommendations vary from person to person. Ask your health care provider what is healthy for you. Here are some general guidelines. Calcium Recommendations Calcium is the most important (essential) mineral for bone health. Most people can get enough calcium from their diet, but supplements may be recommended for people who are at risk for osteoporosis. Good sources of calcium include:  Dairy products, such as low-fat or nonfat milk, cheese, and yogurt.  Dark green leafy vegetables, such as bok choy and broccoli.  Calcium-fortified foods, such as orange juice, cereal, bread, soy beverages, and tofu products.  Nuts, such as almonds.  Follow these recommended amounts for daily calcium intake:  Children, age 1?3: 700 mg.  Children, age 4?8: 1,000 mg.  Children, age 9?13: 1,300 mg.  Teens, age 14?18: 1,300 mg.  Adults, age 19?50: 1,000 mg.  Adults, age 51?70: ? Men: 1,000 mg. ? Women: 1,200 mg.  Adults, age 71 or older: 1,200 mg.  Pregnant and breastfeeding females: ? Teens: 1,300 mg. ? Adults: 1,000 mg.  Vitamin D Recommendations Vitamin D is the most essential vitamin for bone health. It helps the body to absorb calcium. Sunlight stimulates the skin to make vitamin D, so be sure to get enough sunlight. If you live in a cold climate or you do not get outside often, your health care provider may recommend that you take vitamin   D supplements. Good sources of vitamin D in your diet include:  Egg yolks.  Saltwater fish.  Milk and cereal fortified with vitamin D.  Follow these recommended amounts for daily vitamin D intake:  Children and teens, age 17?18: 85 international units.  Adults, age 54 or younger: 400-800 international units.  Adults, age 14 or older: 800-1,000 international units.  Other Nutrients Other nutrients  for bone health include:  Phosphorus. This mineral is found in meat, poultry, dairy foods, nuts, and legumes. The recommended daily intake for adult men and adult women is 700 mg.  Magnesium. This mineral is found in seeds, nuts, dark green vegetables, and legumes. The recommended daily intake for adult men is 400?420 mg. For adult women, it is 310?320 mg.  Vitamin K. This vitamin is found in green leafy vegetables. The recommended daily intake is 120 mg for adult men and 90 mg for adult women.  What type of physical activity is best for building and maintaining healthy bones? Weight-bearing and strength-building activities are important for building and maintaining peak bone mass. Weight-bearing activities cause muscles and bones to work against gravity. Strength-building activities increases muscle strength that supports bones. Weight-bearing and muscle-building activities include:  Walking and hiking.  Jogging and running.  Dancing.  Gym exercises.  Lifting weights.  Tennis and racquetball.  Climbing stairs.  Aerobics.  Adults should get at least 30 minutes of moderate physical activity on most days. Children should get at least 60 minutes of moderate physical activity on most days. Ask your health care provide what type of exercise is best for you. Where can I find more information? For more information, check out the following websites:  [place]: [employer]se  [employer]: http://www.niams.AnonymousEar.fr.asp  This information is not intended to replace advice given to you by your health care provider. Make sure you discuss any questions you have with your health care provider. Document Released: 08/16/2003 Document Revised: 12/14/2015 Document Reviewed: 05/31/2014 Elsevier Interactive Patient Education  2018 [employer]. Letrozole tablets What is this medicine? LETROZOLE  (LET roe zole) blocks the production of estrogen. It is used to treat breast cancer. This medicine may be used for other purposes; ask your health care provider or pharmacist if you have questions. COMMON BRAND NAME(S): Femara What should I tell my health care provider before I take this medicine? They need to know if you have any of these conditions: -high cholesterol -liver disease -osteoporosis (weak bones) -an unusual or allergic reaction to letrozole, other medicines, foods, dyes, or preservatives -pregnant or trying to get pregnant -breast-feeding How should I use this medicine? Take this medicine by mouth with a glass of water. You may take it with or without food. Follow the directions on the prescription label. Take your medicine at regular intervals. Do not take your medicine more often than directed. Do not stop taking except on your doctor's advice. Talk to your pediatrician regarding the use of this medicine in children. Special care may be needed. Overdosage: If you think you have taken too much of this medicine contact a poison control center or emergency room at once. NOTE: This medicine is only for you. Do not share this medicine with others. What if I miss a dose? If you miss a dose, take it as soon as you can. If it is almost time for your next dose, take only that dose. Do not take double or extra doses. What may interact with this medicine? Do not take this medicine  with any of the following medications: -estrogens, like hormone replacement therapy or birth control pills This medicine may also interact with the following medications: -dietary supplements such as androstenedione or DHEA -prasterone -tamoxifen This list may not describe all possible interactions. Give your health care provider a list of all the medicines, herbs, non-prescription drugs, or dietary supplements you use. Also tell them if you smoke, drink alcohol, or use illegal drugs. Some items may interact  with your medicine. What should I watch for while using this medicine? Tell your doctor or healthcare professional if your symptoms do not start to get better or if they get worse. Do not become pregnant while taking this medicine or for 3 weeks after stopping it. Women should inform their doctor if they wish to become pregnant or think they might be pregnant. There is a potential for serious side effects to an unborn child. Talk to your health care professional or pharmacist for more information. Do not breast-feed while taking this medicine or for 3 weeks after stopping it. This medicine may interfere with the ability to have a child. Talk with your doctor or health care professional if you are concerned about your fertility. Using this medicine for a long time may increase your risk of low bone mass. Talk to your doctor about bone health. You may get drowsy or dizzy. Do not drive, use machinery, or do anything that needs mental alertness until you know how this medicine affects you. Do not stand or sit up quickly, especially if you are an older patient. This reduces the risk of dizzy or fainting spells. You may need blood work done while you are taking this medicine. What side effects may I notice from receiving this medicine? Side effects that you should report to your doctor or health care professional as soon as possible: -allergic reactions like skin rash, itching, or hives -bone fracture -chest pain -signs and symptoms of a blood clot such as breathing problems; changes in vision; chest pain; severe, sudden headache; pain, swelling, warmth in the leg; trouble speaking; sudden numbness or weakness of the face, arm or leg -vaginal bleeding Side effects that usually do not require medical attention (report to your doctor or health care professional if they continue or are bothersome): -bone, back, joint, or muscle pain -dizziness -fatigue -fluid retention -headache -hot flashes, night  sweats -nausea -weight gain This list may not describe all possible side effects. Call your doctor for medical advice about side effects. You may report side effects to FDA at 1-800-FDA-1088. Where should I keep my medicine? Keep out of the reach of children. Store between 15 and 30 degrees C (59 and 86 degrees F). Throw away any unused medicine after the expiration date. NOTE: This sheet is a summary. It may not cover all possible information. If you have questions about this medicine, talk to your doctor, pharmacist, or health care provider.  2018 Elsevier/Gold Standard (2015-12-31 11:10:41)

## 2018-01-11 NOTE — Progress Notes (Signed)
[facility]  [phone] [phone]     ID: Mackenzie [name] DOB: January 30, 1971  MR#: [id number]  DUK#:[id number]  Patient Care Team: Willey Blade, MD as PCP - General (Internal Medicine) Magrinat, [name], MD as Consulting Physician (Oncology) Erroll Luna, MD as Consulting Physician (General Surgery) Kyung Rudd, MD as Consulting Physician (Radiation Oncology) Dillingham, Loel Lofty, DO as Attending Physician (Plastic Surgery) OTHER MD:  CHIEF COMPLAINT: Estrogen receptor positive breast cancer  CURRENT TREATMENT: Anti estrogen therapy   HISTORY OF CURRENT ILLNESS: From the original intake note:  The patient herself noted some changes in her left breast late July 2018, she says, and eventually brought this to medical attention so that on 01/09/2017 she underwent bilateral diagnostic mammography with tomography and left breast ultrasonography at the breast Center. This found the breast density to be category C. In the upper inner quadrant of the left breast there was an area of asymmetry and there were malignant type calcifications involving all 4 quadrants. On exam there is firmness and palpable thickening in the anterior left breast with skin dimpling. Ultrasonography found at the 9:30 o'clock radiant 3 cm from the nipple a 2.7 cm mass and in the left axilla for abnormal lymph nodes largest of which measured 2.7 cm.  Biopsy of the left breast 9:30 o'clock mass 80 01/26/2017 showed ([id number]) invasive ductal carcinoma, with extracellular mucin, grade 1 or 2. In the lower outer left breast is separated biopsy the same day showed ductal carcinoma in situ. One of the 4 lymph nodes involved was positive for metastatic carcinoma. Prognostic panel on the invasive disease showed it to be estrogen receptor 100% positive, progesterone receptor 20% positive, both with strong staining intensity, with an MIB-1 of 20%, and no HER-2 amplification with a signals  ratio 1.38 and the number per cell 2.90.  On 01/22/2017 the patient underwent bilateral breast MRI. This showed no involvement of the right breast, but in the left breast there was a masslike and non-masslike enhancement involving all quadrants, with skin swelling but no abnormal enhancement of the skin or nipple areolar complex or pectoralis muscle. The mass could not be clearly measured but spanned approximately 12 cm. There was bulky left axillary lymphadenopathy, with the largest lymph node measuring up to 3.4 cm.  The patient's subsequent history is as detailed below.  INTERVAL HISTORY: Mackenzie [name] returns today for evaluation and treatment of her estrogen receptor positive breast cancer. She recently completed adjuvant radiation on 01/01/2018.  She also receives goserelin every 4 weeks. She also tolerates this well and denies any issues.   She c/o fatigue, and is exercising, but it is a struggle for her.  She is doing well otherwise.     REVIEW OF SYSTEMS: Mackenzie [name] is feeling moderately well.  She notes some left sided breast peeling from the radiation and fatigue, but is otherwise well.  She continues on Goserelin and is tolerating this well.  She is planning on returning to work next week for 6 hour days.  She completed capecitabine sensitization without any issues.  A detailed ROS was conducted today and was non contributory.    PAST MEDICAL HISTORY: Past Medical History:  Diagnosis Date  . Anemia   . Breast cancer ([place]) 01/14/2017   Left breast  . Diabetes mellitus    GDM    PAST SURGICAL HISTORY: Past Surgical History:  Procedure Laterality Date  . BREAST RECONSTRUCTION WITH PLACEMENT OF TISSUE EXPANDER AND FLEX HD (ACELLULAR HYDRATED DERMIS) Left  08/27/2017   Procedure: LEFT BREAST RECONSTRUCTION WITH PLACEMENT OF TISSUE EXPANDER AND FLEX HD;  Surgeon: Wallace Going, DO;  Location: [facility];  Service: Plastics;  Laterality: Left;  . CESAREAN SECTION     x2  . MASTECTOMY  MODIFIED RADICAL Left 08/27/2017  . MASTECTOMY MODIFIED RADICAL Left 08/27/2017   Procedure: LEFT MODIFIED RADICAL MASTECTOMY;  Surgeon: Erroll Luna, MD;  Location: [facility];  Service: General;  Laterality: Left;  . PORT-A-CATH REMOVAL Right 08/27/2017   Procedure: REMOVAL PORT-A-CATH RIGHT CHEST;  Surgeon: Erroll Luna, MD;  Location: [facility];  Service: General;  Laterality: Right;  . PORTACATH PLACEMENT Right 01/28/2017   Procedure: INSERTION PORT-A-CATH WITH ULTRASOUND;  Surgeon: Erroll Luna, MD;  Location: [facility];  Service: General;  Laterality: Right;  . TUBAL LIGATION      FAMILY HISTORY Family History  Problem Relation Age of Onset  . Breast cancer Paternal Grandmother 2       d.60s from breast cancer. Did not have treatment.  . Other Mother        O.15 from complications of surgery to remove brain tumor  The patient's father still alive at age 52. The patient's mother died with a brain tumor which the patient says was "benign". She was 56. The patient has one brother, no sisters. The only breast cancer in the family is a paternal grandmother who died from breast cancer at an unknown age. There is no history of ovarian or prostate cancer in the family.  GYNECOLOGIC HISTORY:  No LMP recorded. (Menstrual status: Chemotherapy). Menarche age 84 and first live birth age 26 she is Sehili P4. The person is still having regular periods as of August 2018  SOCIAL HISTORY: (As of August 2018) Mackenzie [name] works at [employer] on the fifth floor, [place]. Her husband, [name], is a Building control surveyor. Daughter and married and lives in [place] and works as a Education administrator. Daughter, [name] lives in [place] and works in the police department. Daughters, [place] and [place] are 60 and 6, living at home. The patient has no grandchildren. She attends a Tour manager   ADVANCED DIRECTIVES: Not in place   HEALTH MAINTENANCE: Social History   Tobacco Use  . Smoking status:  Never Smoker  . Smokeless tobacco: Never Used  Substance Use Topics  . Alcohol use: Yes    Comment: social  . Drug use: No     Colonoscopy: Never  PAP: December 2017  Bone density: Never   No Known Allergies  Current Outpatient Medications  Medication Sig Dispense Refill  . acetaminophen (TYLENOL) 500 MG tablet Take 1,000 mg by mouth every 6 (six) hours as needed (for pain/headaches.).    Marland Kitchen B Complex Vitamins (VITAMIN B COMPLEX PO) Take 1 capsule by mouth 2 (two) times daily.     . benzonatate (TESSALON) 100 MG capsule Take 100 mg by mouth every 8 (eight) hours as needed for cough.   0  . Calcium Carb-Cholecalciferol (CALCIUM 600+D3 PO) Take 1 tablet by mouth 2 (two) times daily.    . capecitabine (XELODA) 500 MG tablet Take 2 tablets ('1000mg'$ ) by mouth twice daily, within 30 minutes after meals. Take on days of radiation only, M-F 140 tablet 0  . cetirizine (ZYRTEC) 10 MG tablet Take 10 mg by mouth daily.      . Cholecalciferol (VITAMIN D-3) 5000 units TABS Take 5,000 Units by mouth 2 (two) times daily.    Marland Kitchen ibuprofen (ADVIL,MOTRIN) 200 MG tablet Take 800 mg by  mouth every 8 (eight) hours as needed (for pain.).    Marland Kitchen loratadine (CLARITIN) 10 MG tablet Take 1 tablet (10 mg total) by mouth daily. 90 tablet 2  . montelukast (SINGULAIR) 10 MG tablet Take 10 mg by mouth daily as needed (for allergies.).     Marland Kitchen Multiple Vitamin (MULTIVITAMIN WITH MINERALS) TABS tablet Take 1 tablet by mouth daily.    . Omega-3 Fatty Acids (FISH OIL) 1000 MG CAPS Take 2,000 mg by mouth daily.     Marland Kitchen PREVIDENT 5000 BOOSTER PLUS 1.1 % PSTE BRUSH ONCE A DAY IN PLACE OF REGULAR TOOTHPASTE  2  . prochlorperazine (COMPAZINE) 10 MG tablet Take 10 mg by mouth every 6 (six) hours as needed for nausea or vomiting.   1  . senna (SENOKOT) 8.6 MG TABS tablet Take 1 tablet (8.6 mg total) by mouth 2 (two) times daily. 120 each 0   No current facility-administered medications for this visit.    Facility-Administered  Medications Ordered in Other Visits  Medication Dose Route Frequency Provider Last Rate Last Dose  . sodium chloride flush (NS) 0.9 % injection 10 mL  10 mL Intravenous PRN Magrinat, [name], MD   10 mL at 03/10/17 1239    OBJECTIVE:  Vitals:   01/11/18 1341  BP: 134/83  Pulse: 96  Resp: 18  Temp: 98 F (36.7 C)  SpO2: 100%     Body mass index is 43.27 kg/m.   Wt Readings from Last 3 Encounters:  01/11/18 276 lb 4.8 oz (125.3 kg)  12/14/17 279 lb 8 oz (126.8 kg)  11/23/17 277 lb 3.2 oz (125.7 kg)   ECOG FS: 1 - Symptomatic but completely ambulatory GENERAL: Patient is a well appearing female in no acute distress HEENT:  Sclerae anicteric.  Oropharynx clear and moist. No ulcerations or evidence of oropharyngeal candidiasis. Neck is supple.  NODES:  No cervical, supraclavicular, or axillary lymphadenopathy palpated.  BREAST EXAM:  Left mastectomy with moist desquamation noted, no sign of infection, covered with  LUNGS:  Clear to auscultation bilaterally.  No wheezes or rhonchi. HEART:  Regular rate and rhythm. No murmur appreciated. ABDOMEN:  Soft, nontender.  Positive, normoactive bowel sounds. No organomegaly palpated. MSK:  No focal spinal tenderness to palpation. Full range of motion bilaterally in the upper extremities. EXTREMITIES:  No peripheral edema.   SKIN:  Clear with no obvious rashes or skin changes. No nail dyscrasia. NEURO:  Nonfocal. Well oriented.  Appropriate affect.    LAB RESULTS:    Appointment on 01/11/2018  Component Date Value Ref Range Status  . WBC 01/11/2018 5.1  3.9 - 10.3 K/uL Final  . RBC 01/11/2018 4.16  3.70 - 5.45 MIL/uL Final  . Hemoglobin 01/11/2018 11.2* 11.6 - 15.9 g/dL Final  . HCT 01/11/2018 35.0  34.8 - 46.6 % Final  . MCV 01/11/2018 84.1  79.5 - 101.0 fL Final  . MCH 01/11/2018 26.9  25.1 - 34.0 pg Final  . MCHC 01/11/2018 32.0  31.5 - 36.0 g/dL Final  . RDW 01/11/2018 18.2* 11.2 - 14.5 % Final  . Platelets 01/11/2018 241  145  - 400 K/uL Final  . Neutrophils Relative % 01/11/2018 74  % Final  . Neutro Abs 01/11/2018 3.8  1.5 - 6.5 K/uL Final  . Lymphocytes Relative 01/11/2018 10  % Final  . Lymphs Abs 01/11/2018 0.5* 0.9 - 3.3 K/uL Final  . Monocytes Relative 01/11/2018 14  % Final  . Monocytes Absolute 01/11/2018 0.7  0.1 -  0.9 K/uL Final  . Eosinophils Relative 01/11/2018 2  % Final  . Eosinophils Absolute 01/11/2018 0.1  0.0 - 0.5 K/uL Final  . Basophils Relative 01/11/2018 0  % Final  . Basophils Absolute 01/11/2018 0.0  0.0 - 0.1 K/uL Final   Performed at [facility], [place] [address] [place], [place] [address]       STUDIES: No results found.   ELIGIBLE FOR AVAILABLE RESEARCH PROTOCOL: no  ASSESSMENT: 47 y.o. Mackenzie [name] woman status post left breast upper inner quadrant biopsy 01/14/2017 for a clinical T3 N2, stage IIA invasive ductal carcinoma, grade 1 or 2, estrogen and progesterone receptor positive, HER-2 nonamplified, with an MIB-1 of 20%.  (1) staging studies: Brain MRI, bone scan, and CT scan of the chest 02/05/2017 showed no brain lesions, no lung or liver lesions, a 4.9 cm mass in the left breast with left axillary and subpectoral adenopathy, and nonspecific bone scan tracer at L2, left scapula, and anterior ribs, with lumbar spine MRI suggested for further evaluation.  (a) lumbar spine MRI 02/17/2017 showed no normal bone lesions.  There was mild lumbar spondylosis  (2) neoadjuvant chemotherapy consisting of cyclophosphamide and doxorubicin in dose dense fashion 4 started 02/10/2017, completed 03/24/2017, followed by weekly carboplatin and gemcitabine given days 1 and 8 of each 21-day cycle starting 04/14/2017, completing the planned 4 cycles 06/26/2017  (3) is post left modified radical mastectomy on 08/27/2017 showing an mpT3 pN2 residual invasive ductal carcinoma, grade 2, with a residual cancer burden of 3.  Margins were clear  (a) TRAM flap reconstruction  planned  (4) postmastectomy radiation completed 01/01/2018  (a) capecitabine radiosensitization 11/16/2017-01/01/2018  (5) goserelin started 09/21/2017  (a) letrozole to start at the completion of radiation  (6) genetics testing 03/24/2017 through the Common Hereditary Cancer Panel offered by [facility] found no deleterious mutations in APC, ATM, AXIN2, BARD1, BMPR1A, BRCA1, BRCA2, BRIP1, CDH1, CDKN2A (p14ARF), CDKN2A (p16INK4a), CHEK2, CTNNA1, DICER1, EPCAM (Deletion/duplication testing only), GREM1 (promoter region deletion/duplication testing only), KIT, MEN1, MLH1, MSH2, MSH3, MSH6, MUTYH, NBN, NF1, NHTL1, PALB2, PDGFRA, PMS2, POLD1, POLE, PTEN, RAD50, RAD51C, RAD51D, SDHB, SDHC, SDHD, SMAD4, SMARCA4. STK11, TP53, TSC1, TSC2, and VHL.  The following genes were evaluated for sequence changes only: SDHA and HOXB13 c.251G>A variant only.    PLAN:  Mackenzie [name] is doing well today.  She continues on Goserelin and is tolerating it well.  She will start Letrozole and I reviewed risks/benefits with her along with put detailed information in her avs about it.  She will also continue on Goserelin daily.  She continues to experience fatigue.  Hopefully returning to work next week and exercising will help with this.    I ordered a baseline bone density test today and a repeat right breast screening mammogram today that are both due.  She will have these at the breast center.    I also referred her for the [facility] study today.  She is meeting with Merceda Elks RN in research about her candidacy for this clinical trial.    Christionna will return in 4 weeks for labs, and her next injection.  Dr. Jana Hakim will see her in 8 weeks with lab and injection.  She knows to call for any other issues that may develop before the next visit.  A total of (30) minutes of face-to-face time was spent with this patient with greater than 50% of that time in counseling and care-coordination.  [name], NP  01/11/18 1:52  PM Medical Oncology  and Hematology [facility] [address] [place], [place] [address] Tel. [phone]    Fax. [phone]

## 2018-01-12 ENCOUNTER — Encounter: Payer: Self-pay | Admitting: *Deleted

## 2018-01-13 NOTE — Progress Notes (Signed)
  Radiation Oncology         [phone]) [phone] ________________________________  Name: Mackenzie [name] MRN: [id number]  Date: 01/01/2018  DOB: [birth year]  End of Treatment Note  Diagnosis:   47 y.o. 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 This was delivered using a 3-D conformal, 4 field technique. The patient then received a boost to the mastectomy scar. This delivered an additional 10 Gy in 5 fractions using an en face electron field. The total dose was 60.4 Gy.  Narrative: The patient tolerated radiation treatment relatively well.  The patient had some expected skin irritation as she progressed during treatment. Moist desquamation was present at the end of treatment. She continues to apply Sonafine and Neosporin to this area. It will take at least a couple of weeks to significantly heal, but this should look much better in 2-3 weeks.  Plan: The patient has completed radiation treatment. The patient will return to radiation oncology clinic for routine followup in one month. I advised the patient to call or return sooner if they have any questions or concerns related to their recovery or treatment. ________________________________  Jodelle Gross, MD, PhD  This document serves as a record of services personally performed by Kyung Rudd, MD. It was created on his behalf by Rae Lips, a trained medical scribe. The creation of this record is based on the scribe's personal observations and the provider's statements to them. This document has been checked and approved by the attending provider.

## 2018-02-03 DIAGNOSIS — E785 Hyperlipidemia, unspecified: Secondary | ICD-10-CM | POA: Diagnosis not present

## 2018-02-03 DIAGNOSIS — E669 Obesity, unspecified: Secondary | ICD-10-CM | POA: Diagnosis not present

## 2018-02-03 DIAGNOSIS — R7309 Other abnormal glucose: Secondary | ICD-10-CM | POA: Diagnosis not present

## 2018-02-05 ENCOUNTER — Encounter: Payer: Self-pay | Admitting: *Deleted

## 2018-02-09 ENCOUNTER — Inpatient Hospital Stay: Payer: 59

## 2018-02-09 ENCOUNTER — Inpatient Hospital Stay: Payer: 59 | Attending: Adult Health

## 2018-02-09 VITALS — BP 152/81 | HR 90 | Temp 98.3°F | Resp 18

## 2018-02-09 DIAGNOSIS — Z170 Estrogen receptor positive status [ER+]: Secondary | ICD-10-CM

## 2018-02-09 DIAGNOSIS — C773 Secondary and unspecified malignant neoplasm of axilla and upper limb lymph nodes: Secondary | ICD-10-CM | POA: Insufficient documentation

## 2018-02-09 DIAGNOSIS — C50812 Malignant neoplasm of overlapping sites of left female breast: Secondary | ICD-10-CM | POA: Diagnosis not present

## 2018-02-09 DIAGNOSIS — Z95828 Presence of other vascular implants and grafts: Secondary | ICD-10-CM

## 2018-02-09 DIAGNOSIS — Z5111 Encounter for antineoplastic chemotherapy: Secondary | ICD-10-CM | POA: Diagnosis not present

## 2018-02-09 LAB — CBC WITH DIFFERENTIAL/PLATELET
Basophils Absolute: 0 10*3/uL (ref 0.0–0.1)
Basophils Relative: 0 %
Eosinophils Absolute: 0.1 10*3/uL (ref 0.0–0.5)
Eosinophils Relative: 1 %
HEMATOCRIT: 35.7 % (ref 34.8–46.6)
Hemoglobin: 11.6 g/dL (ref 11.6–15.9)
LYMPHS PCT: 18 %
Lymphs Abs: 1 10*3/uL (ref 0.9–3.3)
MCH: 27.1 pg (ref 25.1–34.0)
MCHC: 32.5 g/dL (ref 31.5–36.0)
MCV: 83.4 fL (ref 79.5–101.0)
MONO ABS: 0.5 10*3/uL (ref 0.1–0.9)
MONOS PCT: 8 %
NEUTROS ABS: 3.9 10*3/uL (ref 1.5–6.5)
Neutrophils Relative %: 73 %
PLATELETS: 262 10*3/uL (ref 145–400)
RBC: 4.28 MIL/uL (ref 3.70–5.45)
RDW: 16.5 % — AB (ref 11.2–14.5)
WBC: 5.4 10*3/uL (ref 3.9–10.3)

## 2018-02-09 LAB — COMPREHENSIVE METABOLIC PANEL
ALK PHOS: 129 U/L — AB (ref 38–126)
ALT: 18 U/L (ref 0–44)
ANION GAP: 7 (ref 5–15)
AST: 19 U/L (ref 15–41)
Albumin: 3.7 g/dL (ref 3.5–5.0)
BILIRUBIN TOTAL: 0.4 mg/dL (ref 0.3–1.2)
BUN: 14 mg/dL (ref 6–20)
CALCIUM: 9.8 mg/dL (ref 8.9–10.3)
CO2: 28 mmol/L (ref 22–32)
Chloride: 100 mmol/L (ref 98–111)
Creatinine, Ser: 0.96 mg/dL (ref 0.44–1.00)
GFR calc Af Amer: >60 mL/min (ref >60)
GLUCOSE: 108 mg/dL — AB (ref 70–99)
Potassium: 3.5 mmol/L (ref 3.5–5.1)
Sodium: 135 mmol/L (ref 135–145)
TOTAL PROTEIN: 7.7 g/dL (ref 6.5–8.1)

## 2018-02-09 MED ORDER — GOSERELIN ACETATE 3.6 MG ~~LOC~~ IMPL
DRUG_IMPLANT | SUBCUTANEOUS | Status: AC
  Filled 2018-02-09: qty 3.6

## 2018-02-09 MED ORDER — GOSERELIN ACETATE 3.6 MG ~~LOC~~ IMPL
3.6000 mg | DRUG_IMPLANT | Freq: Once | SUBCUTANEOUS | Status: AC
  Administered 2018-02-09: 3.6 mg via SUBCUTANEOUS

## 2018-02-18 MED FILL — LETROZOLE 2.5 MG TABLET: 2.5 | 30 days supply | Qty: 30 | Fill #1

## 2018-03-01 ENCOUNTER — Ambulatory Visit
Admission: RE | Admit: 2018-03-01 | Discharge: 2018-03-01 | Disposition: A | Discharge status: Home / Self Care | Payer: 59 | Source: Ambulatory Visit | Attending: Radiation Oncology | Admitting: Radiation Oncology

## 2018-03-01 ENCOUNTER — Other Ambulatory Visit: Payer: Self-pay

## 2018-03-01 ENCOUNTER — Encounter: Payer: Self-pay | Admitting: Radiation Oncology

## 2018-03-01 VITALS — BP 143/84 | HR 96 | Temp 98.7°F | Resp 20 | Ht 67.0 in | Wt 273.4 lb

## 2018-03-01 DIAGNOSIS — Z923 Personal history of irradiation: Secondary | ICD-10-CM | POA: Insufficient documentation

## 2018-03-01 DIAGNOSIS — Z79899 Other long term (current) drug therapy: Secondary | ICD-10-CM | POA: Insufficient documentation

## 2018-03-01 DIAGNOSIS — C50812 Malignant neoplasm of overlapping sites of left female breast: Secondary | ICD-10-CM

## 2018-03-01 DIAGNOSIS — Z170 Estrogen receptor positive status [ER+]: Secondary | ICD-10-CM | POA: Diagnosis not present

## 2018-03-01 DIAGNOSIS — C50912 Malignant neoplasm of unspecified site of left female breast: Secondary | ICD-10-CM | POA: Insufficient documentation

## 2018-03-01 NOTE — Progress Notes (Signed)
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 This was delivered using a 3-D conformal, 4 field technique. The patient then received a boost to the mastectomy scar. This delivered an additional 10 Gy in 5 fractions using an en face electron field. The total dose was 60.4 Gy.  Narrative:  The patient returns today for routine follow-up. During treatment she did very well with radiotherapy but did develop moist desquamation toward the end of her treatment.                             On review of systems, the patient states she's healed up well and is doing great. She reports she is not having any pain at her mastectomy site and her skin recovered well. She's planning to have reconstruction in about a year. She reports she is back to work and the majority of her fatigue has resolved. No other complaints are verbalized.  ALLERGIES:  has No Known Allergies.  Meds: Current Outpatient Medications  Medication Sig Dispense Refill  . acetaminophen (TYLENOL) 500 MG tablet Take 1,000 mg by mouth every 6 (six) hours as needed (for pain/headaches.).    Marland Kitchen B Complex Vitamins (VITAMIN B COMPLEX PO) Take 1 capsule by mouth 2 (two) times daily.     . Calcium Carb-Cholecalciferol (CALCIUM 600+D3 PO) Take 1 tablet by mouth 2 (two) times daily.    . cetirizine (ZYRTEC) 10 MG tablet Take 10 mg by mouth daily.      . Cholecalciferol (VITAMIN D-3) 5000 units TABS Take 5,000 Units by mouth 2 (two) times daily.    Marland Kitchen ibuprofen (ADVIL,MOTRIN) 200  MG tablet Take 800 mg by mouth every 8 (eight) hours as needed (for pain.).    Marland Kitchen letrozole (FEMARA) 2.5 MG tablet Take 1 tablet (2.5 mg total) by mouth daily. 30 tablet 5  . Multiple Vitamin (MULTIVITAMIN WITH MINERALS) TABS tablet Take 1 tablet by mouth daily.    . Omega-3 Fatty Acids (FISH OIL) 1000 MG CAPS Take 2,000 mg by mouth daily.     Marland Kitchen PREVIDENT 5000 BOOSTER PLUS 1.1 % PSTE BRUSH ONCE A DAY IN PLACE OF REGULAR TOOTHPASTE  2  . benzonatate (TESSALON) 100 MG capsule Take 100 mg by mouth every 8 (eight) hours as needed for cough.   0  . loratadine (CLARITIN) 10 MG tablet Take 1 tablet (10 mg total) by mouth daily. (Patient not taking: Reported on 03/01/2018) 90 tablet 2  . montelukast (SINGULAIR) 10 MG tablet Take 10 mg by mouth daily as needed (for allergies.).     Marland Kitchen prochlorperazine (COMPAZINE) 10 MG tablet Take 10 mg by mouth every 6 (six) hours as needed for nausea or vomiting.   1  . senna (SENOKOT) 8.6 MG TABS tablet Take 1 tablet (8.6 mg total) by mouth 2 (two) times daily. (Patient not taking: Reported on 03/01/2018) 120 each 0   No current facility-administered medications for this encounter.    Facility-Administered  Medications Ordered in Other Encounters  Medication Dose Route Frequency Provider Last Rate Last Dose  . sodium chloride flush (NS) 0.9 % injection 10 mL  10 mL Intravenous PRN Magrinat, [name], MD   10 mL at 03/10/17 1239    Physical Findings:  height is 5\' 7"  (1.702 m) and weight is 273 lb 6.4 oz (124 kg). Her oral temperature is 98.7 F (37.1 C). Her blood pressure is 143/84 (abnormal) and her pulse is 96. Her respiration is 20 and oxygen saturation is 98%.  Pain Assessment Pain Score: 0-No pain/10 In general this is a well appearing African American female in no acute distress. She's alert and oriented x4 and appropriate throughout the examination. Cardiopulmonary assessment is negative for acute distress and she exhibits normal effort. The left chest wall with  in situ expander was examined and reveals a well healed mastectomy site.   Lab Findings: Lab Results  Component Value Date   WBC 5.4 02/09/2018   HGB 11.6 02/09/2018   HCT 35.7 02/09/2018   MCV 83.4 02/09/2018   PLT 262 02/09/2018     Radiographic Findings: No results found.  Impression/Plan: 1. 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 The patient has been doing well since completion of radiotherapy. We discussed that we would be happy to continue to follow her as needed, but she will also continue to follow up with Dr. Jana Hakim in medical oncology. She was counseled on skin care as well as measures to avoid sun exposure to this area. She will also return to the attention of Dr. Marla Roe for her subsequent reconstruction in about a year. 2. Survivorship. We discussed the importance of survivorship and she will be scheduled for this formal visit once she's seen by Dr. Jana Hakim. She was also given the monthly calendar for access to resources offered within the cancer center.     Carola Rhine, PAC

## 2018-03-07 NOTE — Progress Notes (Signed)
[place]  Telephone:([phone] Fax:([phone]) [phone]     ID: Mackenzie [name] DOB: 10-28-1970  MR#: [id number]  RWE#:[id number]  Patient Care Team: Willey Blade, MD as PCP - General (Internal Medicine) Ranen Doolin, [name], MD as Consulting Physician (Oncology) Erroll Luna, MD as Consulting Physician (General Surgery) Kyung Rudd, MD as Consulting Physician (Radiation Oncology) Dillingham, Loel Lofty, DO as Attending Physician (Plastic Surgery) OTHER MD:  CHIEF COMPLAINT: Estrogen receptor positive breast cancer  CURRENT TREATMENT: letrozole, goserelin   HISTORY OF CURRENT ILLNESS: From the original intake note:  The patient herself noted some changes in her left breast late July 2018, she says, and eventually brought this to medical attention so that on 01/09/2017 she underwent bilateral diagnostic mammography with tomography and left breast ultrasonography at the breast Center. This found the breast density to be category C. In the upper inner quadrant of the left breast there was an area of asymmetry and there were malignant type calcifications involving all 4 quadrants. On exam there is firmness and palpable thickening in the anterior left breast with skin dimpling. Ultrasonography found at the 9:30 o'clock radiant 3 cm from the nipple a 2.7 cm mass and in the left axilla for abnormal lymph nodes largest of which measured 2.7 cm.  Biopsy of the left breast 9:30 o'clock mass 80 01/26/2017 showed ([id number]) invasive ductal carcinoma, with extracellular mucin, grade 1 or 2. In the lower outer left breast is separated biopsy the same day showed ductal carcinoma in situ. One of the 4 lymph nodes involved was positive for metastatic carcinoma. Prognostic panel on the invasive disease showed it to be estrogen receptor 100% positive, progesterone receptor 20% positive, both with strong staining intensity, with an MIB-1 of 20%, and no HER-2 amplification with a signals  ratio 1.38 and the number per cell 2.90.  On 01/22/2017 the patient underwent bilateral breast MRI. This showed no involvement of the right breast, but in the left breast there was a masslike and non-masslike enhancement involving all quadrants, with skin swelling but no abnormal enhancement of the skin or nipple areolar complex or pectoralis muscle. The mass could not be clearly measured but spanned approximately 12 cm. There was bulky left axillary lymphadenopathy, with the largest lymph node measuring up to 3.4 cm.  The patient's subsequent history is as detailed below.  INTERVAL HISTORY: Mackenzie [name] returns today for evaluation and treatment of her estrogen receptor positive breast cancer. She continues on letrozole, with good tolerance. She has occasional night hot flashes that do not wake her up, but she notices some sweating if she does wake up. She also has vaginal dryness, and she is interested in the pelvic health program. She also has arthralgias in her legs that are getting worse.   She also receives goserelin every 4 weeks, with a dose due today. She tolerates this well without any side effects.   She is scheduled for mammography and bone density on 03/16/2018 at [facility].  She qualifies for the [facility] study and also for the aspirin study but for some reason she did not return our research nurses phone messages.  However today she tells me that she is interested in the study and would like to meet with the research nurse if possible.    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 She also takes care of her children during the day.  She is pretty tired.  She has occasional daytime headaches. She  denies  visual changes, nausea, vomiting, or dizziness. There has been no unusual cough, phlegm production, or pleurisy. There has been no change in bowel or bladder habits. She denies unexplained fatigue or unexplained weight loss, bleeding, rash, or  fever. A detailed review of systems was otherwise stable.    PAST MEDICAL HISTORY: Past Medical History:  Diagnosis Date  . Anemia   . Breast cancer ([place]) 01/14/2017   Left breast  . Diabetes mellitus    GDM    PAST SURGICAL HISTORY: Past Surgical History:  Procedure Laterality Date  . BREAST RECONSTRUCTION WITH PLACEMENT OF TISSUE EXPANDER AND FLEX HD (ACELLULAR HYDRATED DERMIS) Left 08/27/2017   Procedure: LEFT BREAST RECONSTRUCTION WITH PLACEMENT OF TISSUE EXPANDER AND FLEX HD;  Surgeon: Wallace Going, DO;  Location: [place];  Service: Plastics;  Laterality: Left;  . CESAREAN SECTION     x2  . MASTECTOMY MODIFIED RADICAL Left 08/27/2017  . MASTECTOMY MODIFIED RADICAL Left 08/27/2017   Procedure: LEFT MODIFIED RADICAL MASTECTOMY;  Surgeon: Erroll Luna, MD;  Location: [facility];  Service: General;  Laterality: Left;  . PORT-A-CATH REMOVAL Right 08/27/2017   Procedure: REMOVAL PORT-A-CATH RIGHT CHEST;  Surgeon: Erroll Luna, MD;  Location: [facility];  Service: General;  Laterality: Right;  . PORTACATH PLACEMENT Right 01/28/2017   Procedure: INSERTION PORT-A-CATH WITH ULTRASOUND;  Surgeon: Erroll Luna, MD;  Location: [facility] [place];  Service: General;  Laterality: Right;  . TUBAL LIGATION Bilateral 01/21/2011    FAMILY HISTORY Family History  Problem Relation Age of Onset  . Breast cancer Paternal Grandmother 42       d.60s from breast cancer. Did not have treatment.  . Other Mother        G.64 from complications of surgery to remove brain tumor  The patient's father still alive at age 54. The patient's mother died with a brain tumor which the patient says was "benign". She was 56. The patient has one brother, no sisters. The only breast cancer in the family is a paternal grandmother who died from breast cancer at an unknown age. There is no history of ovarian or prostate cancer in the family.  GYNECOLOGIC HISTORY:  No LMP recorded. (Menstrual status:  Chemotherapy). Menarche age 59 and first live birth age 73 she is [place] P4. The person is still having regular periods as of August 2018  SOCIAL HISTORY: (As of August 2018) Mackenzie [name] works at [employer] on the fifth floor, [place]. Her husband, [name], is a Building control surveyor. Daughter is married and lives in [place] and works as a Education administrator. Daughter, [name] lives in [place] and works in the police department. Daughters, [place] and [place] are 89 and 6, living at home. The patient has no grandchildren. She attends a Tour manager   ADVANCED DIRECTIVES: Not in place   HEALTH MAINTENANCE: Social History   Tobacco Use  . Smoking status: Never Smoker  . Smokeless tobacco: Never Used  Substance Use Topics  . Alcohol use: Yes    Comment: social  . Drug use: No     Colonoscopy: Never  PAP: December 2017  Bone density: Never   No Known Allergies  Current Outpatient Medications  Medication Sig Dispense Refill  . acetaminophen (TYLENOL) 500 MG tablet Take 1,000 mg by mouth every 6 (six) hours as needed (for pain/headaches.).    Marland Kitchen B Complex Vitamins (VITAMIN B COMPLEX PO) Take 1 capsule by mouth 2 (two) times daily.     . benzonatate (TESSALON) 100 MG capsule  Take 100 mg by mouth every 8 (eight) hours as needed for cough.   0  . Calcium Carb-Cholecalciferol (CALCIUM 600+D3 PO) Take 1 tablet by mouth 2 (two) times daily.    . cetirizine (ZYRTEC) 10 MG tablet Take 10 mg by mouth daily.      . Cholecalciferol (VITAMIN D-3) 5000 units TABS Take 5,000 Units by mouth 2 (two) times daily.    Marland Kitchen ibuprofen (ADVIL,MOTRIN) 200 MG tablet Take 800 mg by mouth every 8 (eight) hours as needed (for pain.).    Marland Kitchen letrozole (FEMARA) 2.5 MG tablet Take 1 tablet (2.5 mg total) by mouth daily. 30 tablet 5  . loratadine (CLARITIN) 10 MG tablet Take 1 tablet (10 mg total) by mouth daily. (Patient not taking: Reported on 03/01/2018) 90 tablet 2  . montelukast (SINGULAIR) 10 MG tablet Take 10 mg by mouth  daily as needed (for allergies.).     Marland Kitchen Multiple Vitamin (MULTIVITAMIN WITH MINERALS) TABS tablet Take 1 tablet by mouth daily.    . Omega-3 Fatty Acids (FISH OIL) 1000 MG CAPS Take 2,000 mg by mouth daily.     Marland Kitchen PREVIDENT 5000 BOOSTER PLUS 1.1 % PSTE BRUSH ONCE A DAY IN PLACE OF REGULAR TOOTHPASTE  2  . prochlorperazine (COMPAZINE) 10 MG tablet Take 10 mg by mouth every 6 (six) hours as needed for nausea or vomiting.   1  . senna (SENOKOT) 8.6 MG TABS tablet Take 1 tablet (8.6 mg total) by mouth 2 (two) times daily. (Patient not taking: Reported on 03/01/2018) 120 each 0   No current facility-administered medications for this visit.    Facility-Administered Medications Ordered in Other Visits  Medication Dose Route Frequency Provider Last Rate Last Dose  . sodium chloride flush (NS) 0.9 % injection 10 mL  10 mL Intravenous PRN [name] Gavitt, [name], MD   10 mL at 03/10/17 1239    OBJECTIVE: Middle-aged African-American woman in no acute distress Vitals:   03/08/18 1426  BP: (!) 150/72  Pulse: 92  Resp: 18  Temp: 98.3 F (36.8 C)  SpO2: 100%     Body mass index is 42.52 kg/m.   Wt Readings from Last 3 Encounters:  03/08/18 271 lb 8 oz (123.2 kg)  03/01/18 273 lb 6.4 oz (124 kg)  01/11/18 276 lb 4.8 oz (125.3 kg)   ECOG FS: 1 - Symptomatic but completely ambulatory  Sclerae unicteric, pupils round and equal No cervical or supraclavicular adenopathy Lungs no rales or rhonchi Heart regular rate and rhythm Abd soft, nontender, positive bowel sounds MSK no focal spinal tenderness, no upper extremity lymphedema Neuro: nonfocal, well oriented, appropriate affect Breasts: The right breast is benign.  The left breast is status post mastectomy and radiation, with expander in place.  There is no evidence of local recurrence.  Both axillae are benign.    LAB RESULTS:    Appointment on 03/08/2018  Component Date Value Ref Range Status  . WBC 03/08/2018 5.0  3.9 - 10.3 K/uL Final  .  RBC 03/08/2018 4.12  3.70 - 5.45 MIL/uL Final  . Hemoglobin 03/08/2018 11.2* 11.6 - 15.9 g/dL Final  . HCT 03/08/2018 34.3* 34.8 - 46.6 % Final  . MCV 03/08/2018 83.2  79.5 - 101.0 fL Final  . MCH 03/08/2018 27.3  25.1 - 34.0 pg Final  . MCHC 03/08/2018 32.8  31.5 - 36.0 g/dL Final  . RDW 03/08/2018 15.7* 11.2 - 14.5 % Final  . Platelets 03/08/2018 268  145 - 400 K/uL Final  .  Neutrophils Relative % 03/08/2018 67  % Final  . Neutro Abs 03/08/2018 3.4  1.5 - 6.5 K/uL Final  . Lymphocytes Relative 03/08/2018 19  % Final  . Lymphs Abs 03/08/2018 0.9  0.9 - 3.3 K/uL Final  . Monocytes Relative 03/08/2018 12  % Final  . Monocytes Absolute 03/08/2018 0.6  0.1 - 0.9 K/uL Final  . Eosinophils Relative 03/08/2018 1  % Final  . Eosinophils Absolute 03/08/2018 0.1  0.0 - 0.5 K/uL Final  . Basophils Relative 03/08/2018 1  % Final  . Basophils Absolute 03/08/2018 0.0  0.0 - 0.1 K/uL Final   Performed at [facility], [place] [address] [place], [place] [address]       STUDIES: Scheduled for mammography and bone density on 03/16/2018 at [facility].  ELIGIBLE FOR AVAILABLE RESEARCH PROTOCOL: [name], ASA study  ASSESSMENT: 47 y.o. [place] woman status post left breast upper inner quadrant biopsy 01/14/2017 for a clinical T3 N2, stage IIA invasive ductal carcinoma, grade 1 or 2, estrogen and progesterone receptor positive, HER-2 nonamplified, with an MIB-1 of 20%.  (1) staging studies: Brain MRI, bone scan, and CT scan of the chest 02/05/2017 showed no brain lesions, no lung or liver lesions, a 4.9 cm mass in the left breast with left axillary and subpectoral adenopathy, and nonspecific bone scan tracer at L2, left scapula, and anterior ribs, with lumbar spine MRI suggested for further evaluation.  (a) lumbar spine MRI 02/17/2017 showed no normal bone lesions.  There was mild lumbar spondylosis  (2) neoadjuvant chemotherapy consisting of cyclophosphamide and  doxorubicin in dose dense fashion 4 started 02/10/2017, completed 03/24/2017, followed by weekly carboplatin and gemcitabine given days 1 and 8 of each 21-day cycle starting 04/14/2017, completing the planned 4 cycles 06/26/2017  (3) is post left modified radical mastectomy on 08/27/2017 showing an mpT3 pN2 residual invasive ductal carcinoma, grade 2, with a residual cancer burden of 3.  Margins were clear  (a) TRAM flap reconstruction planned  (4) postmastectomy radiation completed 01/01/2018  (a) capecitabine radiosensitization 11/16/2017-01/01/2018  (5) goserelin started 09/21/2017  (a) letrozole started 01/11/2018  (6) genetics testing 03/24/2017 through the Common Hereditary Cancer Panel offered by [facility] found no deleterious mutations in APC, ATM, AXIN2, BARD1, BMPR1A, BRCA1, BRCA2, BRIP1, CDH1, CDKN2A (p14ARF), CDKN2A (p16INK4a), CHEK2, CTNNA1, DICER1, EPCAM (Deletion/duplication testing only), GREM1 (promoter region deletion/duplication testing only), KIT, MEN1, MLH1, MSH2, MSH3, MSH6, MUTYH, NBN, NF1, NHTL1, PALB2, PDGFRA, PMS2, POLD1, POLE, PTEN, RAD50, RAD51C, RAD51D, SDHB, SDHC, SDHD, SMAD4, SMARCA4. STK11, TP53, TSC1, TSC2, and VHL.  The following genes were evaluated for sequence changes only: SDHA and HOXB13 c.251G>A variant only.    PLAN: Everett is experiencing menopausal symptoms as expected.  The worst problem is vaginal dryness and I am referring her to the pelvic health physical therapy program.  I think she will greatly benefit from that.  She is also having hot flashes both at night and during the day.  I think she will benefit from gabapentin at bedtime.  She may need venlafaxine added and I have asked her to let me know after about 2 weeks from the gabapentin whether she feels a little more support is needed.  However she is also having some aches and pains which are more than she had before.  She has a pretty busy schedule working here and at home.  However it could well  be that the aromatase inhibitor is worsening that.  If so we would switch her to  tamoxifen.  She is meeting with our research nurse Manuela Schwartz today.  Hopefully we will be able to enroll her in the [place] study.  She also will qualify for the aspirin study I believe.  She will see me again in 8 weeks.  She knows to call for any other issues that may develop before that visit.  Moyses Pavey, [name], MD  03/08/18 2:43 PM Medical Oncology and Hematology [facility] [address] [place],  [address] Tel. [phone]    Fax. [phone]  Alice Rieger, am acting as scribe for Chauncey Cruel MD.  I, Lurline Del MD, have reviewed the above documentation for accuracy and completeness, and I agree with the above.

## 2018-03-08 ENCOUNTER — Telehealth: Payer: Self-pay | Admitting: Oncology

## 2018-03-08 ENCOUNTER — Inpatient Hospital Stay (HOSPITAL_BASED_OUTPATIENT_CLINIC_OR_DEPARTMENT_OTHER): Payer: 59 | Admitting: Oncology

## 2018-03-08 ENCOUNTER — Inpatient Hospital Stay: Payer: 59

## 2018-03-08 VITALS — BP 150/72 | HR 92 | Temp 98.3°F | Resp 18 | Ht 67.0 in | Wt 271.5 lb

## 2018-03-08 DIAGNOSIS — Z170 Estrogen receptor positive status [ER+]: Principal | ICD-10-CM

## 2018-03-08 DIAGNOSIS — N898 Other specified noninflammatory disorders of vagina: Secondary | ICD-10-CM

## 2018-03-08 DIAGNOSIS — N951 Menopausal and female climacteric states: Secondary | ICD-10-CM

## 2018-03-08 DIAGNOSIS — C50812 Malignant neoplasm of overlapping sites of left female breast: Secondary | ICD-10-CM

## 2018-03-08 DIAGNOSIS — Z9012 Acquired absence of left breast and nipple: Secondary | ICD-10-CM | POA: Diagnosis not present

## 2018-03-08 DIAGNOSIS — Z79811 Long term (current) use of aromatase inhibitors: Secondary | ICD-10-CM | POA: Diagnosis not present

## 2018-03-08 DIAGNOSIS — Z5111 Encounter for antineoplastic chemotherapy: Secondary | ICD-10-CM | POA: Diagnosis not present

## 2018-03-08 DIAGNOSIS — C773 Secondary and unspecified malignant neoplasm of axilla and upper limb lymph nodes: Secondary | ICD-10-CM | POA: Diagnosis not present

## 2018-03-08 DIAGNOSIS — Z95828 Presence of other vascular implants and grafts: Secondary | ICD-10-CM

## 2018-03-08 LAB — CBC WITH DIFFERENTIAL/PLATELET
Basophils Absolute: 0 10*3/uL (ref 0.0–0.1)
Basophils Relative: 1 %
Eosinophils Absolute: 0.1 10*3/uL (ref 0.0–0.5)
Eosinophils Relative: 1 %
HEMATOCRIT: 34.3 % — AB (ref 34.8–46.6)
Hemoglobin: 11.2 g/dL — ABNORMAL LOW (ref 11.6–15.9)
LYMPHS ABS: 0.9 10*3/uL (ref 0.9–3.3)
LYMPHS PCT: 19 %
MCH: 27.3 pg (ref 25.1–34.0)
MCHC: 32.8 g/dL (ref 31.5–36.0)
MCV: 83.2 fL (ref 79.5–101.0)
Monocytes Absolute: 0.6 10*3/uL (ref 0.1–0.9)
Monocytes Relative: 12 %
NEUTROS ABS: 3.4 10*3/uL (ref 1.5–6.5)
NEUTROS PCT: 67 %
Platelets: 268 10*3/uL (ref 145–400)
RBC: 4.12 MIL/uL (ref 3.70–5.45)
RDW: 15.7 % — ABNORMAL HIGH (ref 11.2–14.5)
WBC: 5 10*3/uL (ref 3.9–10.3)

## 2018-03-08 LAB — COMPREHENSIVE METABOLIC PANEL
ALT: 22 U/L (ref 0–44)
ANION GAP: 6 (ref 5–15)
AST: 16 U/L (ref 15–41)
Albumin: 3.6 g/dL (ref 3.5–5.0)
Alkaline Phosphatase: 127 U/L — ABNORMAL HIGH (ref 38–126)
BUN: 13 mg/dL (ref 6–20)
CHLORIDE: 103 mmol/L (ref 98–111)
CO2: 29 mmol/L (ref 22–32)
Calcium: 9.4 mg/dL (ref 8.9–10.3)
Creatinine, Ser: 0.82 mg/dL (ref 0.44–1.00)
Glucose, Bld: 119 mg/dL — ABNORMAL HIGH (ref 70–99)
POTASSIUM: 3.5 mmol/L (ref 3.5–5.1)
Sodium: 138 mmol/L (ref 135–145)
Total Bilirubin: 0.2 mg/dL — ABNORMAL LOW (ref 0.3–1.2)
Total Protein: 7.6 g/dL (ref 6.5–8.1)

## 2018-03-08 MED ORDER — GOSERELIN ACETATE 3.6 MG ~~LOC~~ IMPL
DRUG_IMPLANT | SUBCUTANEOUS | Status: AC
  Filled 2018-03-08: qty 3.6

## 2018-03-08 MED ORDER — GABAPENTIN 300 MG PO CAPS: 300.0000 mg | ORAL_CAPSULE | Freq: Every day | ORAL | 4 refills | Status: DC

## 2018-03-08 MED ORDER — GOSERELIN ACETATE 3.6 MG ~~LOC~~ IMPL
3.6000 mg | DRUG_IMPLANT | Freq: Once | SUBCUTANEOUS | Status: AC
  Administered 2018-03-08: 3.6 mg via SUBCUTANEOUS

## 2018-03-08 MED FILL — GABAPENTIN 300 MG CAPSULE: 300 | 90 days supply | Qty: 90 | Fill #0

## 2018-03-08 NOTE — Patient Instructions (Signed)
Goserelin injection What is this medicine? GOSERELIN (GOE se rel in) is similar to a hormone found in the body. It lowers the amount of sex hormones that the body makes. Men will have lower testosterone levels and women will have lower estrogen levels while taking this medicine. In men, this medicine is used to treat prostate cancer; the injection is either given once per month or once every 12 weeks. A once per month injection (only) is used to treat women with endometriosis, dysfunctional uterine bleeding, or advanced breast cancer. This medicine may be used for other purposes; ask your health care provider or pharmacist if you have questions. COMMON BRAND NAME(S): Zoladex What should I tell my health care provider before I take this medicine? They need to know if you have any of these conditions (some only apply to women): -diabetes -heart disease or previous heart attack -high blood pressure -high cholesterol -kidney disease -osteoporosis or low bone density -problems passing urine -spinal cord injury -stroke -tobacco smoker -an unusual or allergic reaction to goserelin, hormone therapy, other medicines, foods, dyes, or preservatives -pregnant or trying to get pregnant -breast-feeding How should I use this medicine? This medicine is for injection under the skin. It is given by a health care professional in a hospital or clinic setting. Men receive this injection once every 4 weeks or once every 12 weeks. Women will only receive the once every 4 weeks injection. Talk to your pediatrician regarding the use of this medicine in children. Special care may be needed. Overdosage: If you think you have taken too much of this medicine contact a poison control center or emergency room at once. NOTE: This medicine is only for you. Do not share this medicine with others. What if I miss a dose? It is important not to miss your dose. Call your doctor or health care professional if you are unable to  keep an appointment. What may interact with this medicine? -female hormones like estrogen -herbal or dietary supplements like black cohosh, chasteberry, or DHEA -female hormones like testosterone -prasterone This list may not describe all possible interactions. Give your health care provider a list of all the medicines, herbs, non-prescription drugs, or dietary supplements you use. Also tell them if you smoke, drink alcohol, or use illegal drugs. Some items may interact with your medicine. What should I watch for while using this medicine? Visit your doctor or health care professional for regular checks on your progress. Your symptoms may appear to get worse during the first weeks of this therapy. Tell your doctor or healthcare professional if your symptoms do not start to get better or if they get worse after this time. Your bones may get weaker if you take this medicine for a long time. If you smoke or frequently drink alcohol you may increase your risk of bone loss. A family history of osteoporosis, chronic use of drugs for seizures (convulsions), or corticosteroids can also increase your risk of bone loss. Talk to your doctor about how to keep your bones strong. This medicine should stop regular monthly menstration in women. Tell your doctor if you continue to menstrate. Women should not become pregnant while taking this medicine or for 12 weeks after stopping this medicine. Women should inform their doctor if they wish to become pregnant or think they might be pregnant. There is a potential for serious side effects to an unborn child. Talk to your health care professional or pharmacist for more information. Do not breast-feed an infant while taking   this medicine. Men should inform their doctors if they wish to father a child. This medicine may lower sperm counts. Talk to your health care professional or pharmacist for more information. What side effects may I notice from receiving this  medicine? Side effects that you should report to your doctor or health care professional as soon as possible: -allergic reactions like skin rash, itching or hives, swelling of the face, lips, or tongue -bone pain -breathing problems -changes in vision -chest pain -feeling faint or lightheaded, falls -fever, chills -pain, swelling, warmth in the leg -pain, tingling, numbness in the hands or feet -signs and symptoms of low blood pressure like dizziness; feeling faint or lightheaded, falls; unusually weak or tired -stomach pain -swelling of the ankles, feet, hands -trouble passing urine or change in the amount of urine -unusually high or low blood pressure -unusually weak or tired Side effects that usually do not require medical attention (report to your doctor or health care professional if they continue or are bothersome): -change in sex drive or performance -changes in breast size in both males and females -changes in emotions or moods -headache -hot flashes -irritation at site where injected -loss of appetite -skin problems like acne, dry skin -vaginal dryness This list may not describe all possible side effects. Call your doctor for medical advice about side effects. You may report side effects to FDA at 1-800-FDA-1088. Where should I keep my medicine? This drug is given in a hospital or clinic and will not be stored at home. NOTE: This sheet is a summary. It may not cover all possible information. If you have questions about this medicine, talk to your doctor, pharmacist, or health care provider.  2018 Elsevier/Gold Standard (2013-08-02 11:10:35)  

## 2018-03-08 NOTE — Telephone Encounter (Signed)
Gave avs and calendar ° °

## 2018-03-16 ENCOUNTER — Inpatient Hospital Stay: Payer: 59 | Admitting: *Deleted

## 2018-03-16 ENCOUNTER — Ambulatory Visit
Admission: RE | Admit: 2018-03-16 | Discharge: 2018-03-16 | Disposition: A | Discharge status: Home / Self Care | Payer: 59 | Source: Ambulatory Visit | Attending: Adult Health | Admitting: Adult Health

## 2018-03-16 ENCOUNTER — Telehealth: Payer: Self-pay

## 2018-03-16 DIAGNOSIS — E2839 Other primary ovarian failure: Secondary | ICD-10-CM

## 2018-03-16 DIAGNOSIS — Z1231 Encounter for screening mammogram for malignant neoplasm of breast: Secondary | ICD-10-CM | POA: Diagnosis not present

## 2018-03-16 DIAGNOSIS — Z13820 Encounter for screening for osteoporosis: Secondary | ICD-10-CM | POA: Diagnosis not present

## 2018-03-16 DIAGNOSIS — Z1239 Encounter for other screening for malignant neoplasm of breast: Secondary | ICD-10-CM

## 2018-03-16 DIAGNOSIS — Z780 Asymptomatic menopausal state: Secondary | ICD-10-CM | POA: Diagnosis not present

## 2018-03-16 NOTE — Telephone Encounter (Signed)
-----   Message from Gardenia Phlegm, NP sent at 03/16/2018  1:31 PM EDT ----- Bone density normal, please notify patient ----- Message ----- From: Interface, Rad Results In Sent: 03/16/2018  10:47 AM EDT To: Gardenia Phlegm, NP

## 2018-03-16 NOTE — Telephone Encounter (Signed)
Spoke with pt to inform that BD results are normal.  Pt voiced understanding.  No questions or concerns at this time.

## 2018-03-18 ENCOUNTER — Encounter: Payer: Self-pay | Admitting: *Deleted

## 2018-03-18 NOTE — Progress Notes (Signed)
[id number]: Received notification from Raul Del, PharmD that review of current medication list reveals no potential drug interactions with ribociclib or her letrozole, and she is not taking any contraindicated medications according to protocol. Mauri Reading Michaelle Copas, RN, BSN Clinical Research Nurse 03/18/18 @ 10:00

## 2018-03-19 ENCOUNTER — Inpatient Hospital Stay: Payer: 59 | Attending: Adult Health | Admitting: *Deleted

## 2018-03-19 DIAGNOSIS — C773 Secondary and unspecified malignant neoplasm of axilla and upper limb lymph nodes: Secondary | ICD-10-CM | POA: Insufficient documentation

## 2018-03-19 DIAGNOSIS — Z5111 Encounter for antineoplastic chemotherapy: Secondary | ICD-10-CM | POA: Insufficient documentation

## 2018-03-19 DIAGNOSIS — C50812 Malignant neoplasm of overlapping sites of left female breast: Secondary | ICD-10-CM | POA: Insufficient documentation

## 2018-03-19 MED FILL — LETROZOLE 2.5 MG TABLET: 2.5 | 30 days supply | Qty: 30 | Fill #2

## 2018-03-23 ENCOUNTER — Other Ambulatory Visit: Payer: Self-pay | Admitting: Oncology

## 2018-03-23 NOTE — Progress Notes (Signed)
Significant family issues discussed by research nurse Manuela Schwartz.  At present the patient does not feel she is able to participate in the [place] study.  This can be reassessed.

## 2018-04-01 ENCOUNTER — Encounter: Payer: Self-pay | Admitting: Physical Therapy

## 2018-04-01 ENCOUNTER — Ambulatory Visit: Payer: 59 | Attending: Oncology | Admitting: Physical Therapy

## 2018-04-01 ENCOUNTER — Other Ambulatory Visit: Payer: Self-pay

## 2018-04-01 DIAGNOSIS — R252 Cramp and spasm: Secondary | ICD-10-CM

## 2018-04-01 DIAGNOSIS — M6281 Muscle weakness (generalized): Secondary | ICD-10-CM

## 2018-04-01 DIAGNOSIS — R278 Other lack of coordination: Secondary | ICD-10-CM

## 2018-04-01 NOTE — Patient Instructions (Addendum)
Moisturizers . They are used in the vagina to hydrate the mucous membrane that make up the vaginal canal. . Designed to keep a more normal acid balance (ph) . Once placed in the vagina, it will last between two to three days.  . Use 2-3 times per week at bedtime and last longer than 60 min. . Ingredients to avoid is glycerin and fragrance, can increase chance of infection . Should not be used just before sex due to causing irritation . Most are gels administered either in a tampon-shaped applicator or as a vaginal suppository. They are non-hormonal.   Types of Moisturizers . Samul Dada- drug store . Vitamin E vaginal suppositories- Whole foods, Amazon . Moist Again . Coconut oil- can break down condoms . Julva- (Do no use if on Tamoxifen) amazon . Yes moisturizer- amazon . NeuEve Silk , NeuEve Silver for menopausal or over 65 (if have severe vaginal atrophy or cancer treatments use NeuEve Silk for  1 month than move to The Pepsi)- [employer], MapleFlower.dk . Olive and Bee intimate cream- www.oliveandbee.com.au . Mae vaginal moisturizer- Amazon  Creams to use externally on the Vulva area  Albertson's (good for for cancer patients that had radiation to the area)- [place] or [employer] Corporation.FlyingBasics.com.br  V-magic cream - amazon  Julva-amazon  Vital "V Wild Yam salve ( help moisturize and help with thinning vulvar area, does have [place] by Damiva labial moisturizer ([place],    Things to avoid in the vaginal area . Do not use things to irritate the vulvar area . No lotions just specialized creams for the vulva area- Neogyn, V-magic, No soaps; can use Aveeno or Calendula cleanser if needed. Must be gentle . No deodorants . No douches . Good to sleep without underwear to let the vaginal area to air out . No scrubbing: spread the lips to let warm water rinse over labias and pat dry  Lubrication . Used  for intercourse to reduce friction . Avoid ones that have glycerin, warming gels, tingling gels, icing or cooling gel, scented . May need to be reapplied once or several times during sexual activity . Can be applied to both partners genitals prior to vaginal penetration to minimize friction or irritation . Prevent irritation and mucosal tears that cause post coital pain and increased the risk of vaginal and urinary tract infections . Oil-based lubricants cannot be used with condoms due to breaking them down.  Least likely to irritate vaginal tissue.  . Plant based-lubes are safe . Silicone-based lubrication are thicker and last long and used for post-menopausal women Types of Lubricants . Good Clean Love (water based)-R[employer]ide, Target, Walmart, CVS . Slippery Stuff(water based) [employer] . Sylk (water based) [employer], [place]- drug store; www.blossom-organics.com . Samul Dada- Drug store . Coconut oil- will breakdown condoms, least irritating . Aloe Vera- least irritating . Sliquid Natural H20 (water based)-[facility], good if frequent UTI's . Wet Platinum- (Silicone) Target, [facility] . Yes [place] . [place] Jelly, Replens, and Astroglide kills good Bacteria (lactobadilli)  Things to avoid in the vaginal area . Do not use things to irritate the vulvar area . No lotions . No soaps; can use Aveeno or Calendula cleanser if needed. Must be gentle . No deodorants . No douches . Good to sleep without underwear to let the vaginal area to air out . No scrubbing: spread the lips to let warm water rinse over labias and pat  dry  STRETCHING THE PELVIC FLOOR MUSCLES NO DILATOR  Supplies . Vaginal lubricant . Mirror (optional) . Gloves (optional) Positioning . Start in a semi-reclined position with your head propped up. Bend your knees and place your thumb or finger at the vaginal opening. Procedure . Apply a moderate amount of lubricant on the outer skin of your vagina, the  labia minora.  Apply additional lubricant to your finger. Marland Kitchen Spread the skin away from the vaginal opening. Place the end of your finger at the opening. . Do a maximum contraction of the pelvic floor muscles. Tighten the vagina and the anus maximally and relax. . When you know they are relaxed, gently and slowly insert your finger into your vagina, directing your finger slightly downward, for 2-3 inches of insertion. . Relax and stretch the 6 o'clock position . Hold each stretch for _2 min__ and repeat __1_ time with rest breaks of _1__ seconds between each stretch. . Repeat the stretching in the 4 o'clock and 8 o'clock positions. . Total time should be _6__ minutes, _1__ x per day.  Note the amount of theme your were able to achieve and your tolerance to your finger in your vagina. . Once you have accomplished the techniques you may try them in standing with one foot resting on the tub, or in other positions.  This is a good stretch to do in the shower if you don't need to use lubricant.  Access Code: [email or web address]  URL: [email or web address]  Date: 04/01/2018  Prepared by: Lovett Calender   Exercises  Seated Hamstring Stretch - 3 reps - 1 sets - 30 sec hold - 1x daily - 7x weekly  Supine Figure 4 Piriformis Stretch - 3 reps - 1 sets - 30 sec hold - 1x daily - 7x weekly  Supine Pelvic Floor Contraction - 5 reps - 1 sets - 10 sec hold - 3x daily - 7x weekly

## 2018-04-01 NOTE — Therapy (Signed)
[facility] [address] [place], [place], [address] Phone: [phone]   Fax:  [phone]  Physical Therapy Evaluation  Patient Details  Name: Mackenzie [name] MRN: [id number] Date of Birth: 22-Mar-1971 Referring Provider (PT): Magrinat, [name], MD   Encounter Date: 04/01/2018  PT End of Session - 04/01/18 1032    Visit Number  1    Date for PT Re-Evaluation  05/13/18    PT Start Time  0935    PT Stop Time  1015    PT Time Calculation (min)  40 min    Activity Tolerance  Patient tolerated treatment well       Past Medical History:  Diagnosis Date  . Anemia   . Breast cancer ([place]) 01/14/2017   Left breast  . Diabetes mellitus    GDM    Past Surgical History:  Procedure Laterality Date  . BREAST RECONSTRUCTION WITH PLACEMENT OF TISSUE EXPANDER AND FLEX HD (ACELLULAR HYDRATED DERMIS) Left 08/27/2017   Procedure: LEFT BREAST RECONSTRUCTION WITH PLACEMENT OF TISSUE EXPANDER AND FLEX HD;  Surgeon: Wallace Going, DO;  Location: [place];  Service: Plastics;  Laterality: Left;  . CESAREAN SECTION     x2  . MASTECTOMY MODIFIED RADICAL Left 08/27/2017  . MASTECTOMY MODIFIED RADICAL Left 08/27/2017   Procedure: LEFT MODIFIED RADICAL MASTECTOMY;  Surgeon: Erroll Luna, MD;  Location: [place];  Service: General;  Laterality: Left;  . PORT-A-CATH REMOVAL Right 08/27/2017   Procedure: REMOVAL PORT-A-CATH RIGHT CHEST;  Surgeon: Erroll Luna, MD;  Location: [place];  Service: General;  Laterality: Right;  . PORTACATH PLACEMENT Right 01/28/2017   Procedure: INSERTION PORT-A-CATH WITH ULTRASOUND;  Surgeon: Erroll Luna, MD;  Location: [place];  Service: General;  Laterality: Right;  . TUBAL LIGATION Bilateral 01/21/2011    There were no vitals filed for this visit.   Subjective Assessment - 04/01/18 0940    Subjective  Pt states she had been having some vaginal dryness and some incontinence  when laughing, coughing, sneezing.      Currently in Pain?  No/denies         [facility] PT Assessment - 04/01/18 0001      Assessment   Medical Diagnosis  C50.812,Z17.0 (ICD-10-CM) - Malignant 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, estrogen receptor positive ([place])    Referring Provider (PT)  Magrinat, [name], MD    Onset Date/Surgical Date  --   leakage for 2 years; dryness for 3 months   Prior Therapy  No      Precautions   Precautions  None      Restrictions   Weight Bearing Restrictions  No      Balance Screen   Has the patient fallen in the past 6 months  No      [place] residence    Living Arrangements  Spouse/significant other;Children   15 and 6 y/o     Prior Function   Level of Independence  Independent    Vocation  Part time employment    Vocation Requirements  light duty, desk work    Leisure  walking, pool exercises, light jogging      Cognition   Overall Cognitive Status  Within Functional Limits for tasks assessed      Posture/Postural Control   Posture/Postural Control  Postural limitations    Postural Limitations  Rounded Shoulders;Increased thoracic kyphosis;Increased lumbar lordosis  ROM / Strength   AROM / PROM / Strength  PROM;Strength      PROM   Overall PROM Comments  hip IR limited 20%; ER limited 50%      Strength   Overall Strength Comments  hip flexion and abduciton 4/5 MMT; core 3/5 MMT with pelvic instability during SLR - improved with pelvic compression to support TrA      Flexibility   Soft Tissue Assessment /Muscle Length  yes    Hamstrings  45 degress hip flexion in supine      Palpation   Palpation comment  adductors tight      Ambulation/Gait   Gait Pattern  Decreased stride length                Objective measurements completed on examination: See above findings.    Pelvic Floor Special Questions - 04/01/18 0001    Prior Pelvic/Prostate Exam  Yes     Are you Pregnant or attempting pregnancy?  No    Prior Pregnancies  Yes    Number of Pregnancies  4    Number of C-Sections  3    Number of Vaginal Deliveries  1    Any difficulty with labor and deliveries  Yes    Currently Sexually Active  Yes    Is this Painful  Yes    Marinoff Scale  pain prevents any attempts at intercourse    Urinary Leakage  Yes    How often  every day    Pad use  panty liner    Activities that cause leaking  Coughing;Sneezing;Laughing    Urinary urgency  Yes    Urinary frequency  4-5 hours   more frequent every 30 min if drinks a lot of water   Fluid intake  10 cups    Falling out feeling (prolapse)  No    Skin Integrity  Intact    Prolapse  None    Pelvic Floor Internal Exam  pt identity confirmed and informed consent given to perform internal soft tissue assessment    Exam Type  Vaginal    Palpation  levator ani muscle group high tone, fascial resriction througout vaginal canal, bulbocavernosis high tone, fascial restirction around urethra    Strength  fair squeeze, definite lift    Strength # of reps  1    Strength # of seconds  9    Tone  high               PT Education - 04/01/18 0958    Education Details   Access Code: [email or web address] , massage and lubricant, self massage    Person(s) Educated  Patient    Methods  Explanation;Handout;Verbal cues;Demonstration    Comprehension  Verbalized understanding;Returned demonstration          PT Long Term Goals - 04/01/18 1043      PT LONG TERM GOAL #1   Title  Pt. will be independent in HEP    Time  6    Period  Weeks    Status  New    Target Date  05/13/18      PT LONG TERM GOAL #2   Title  pt will report 50% less leakage due to increased strength and endurance of pelvic floor    Time  6    Period  Weeks    Status  New    Target Date  05/13/18      PT LONG TERM GOAL #3   Title  pt will be able to attempt intercourse due to improved soft tissue pliabiliity and health    Time  6     Period  Weeks    Status  New    Target Date  05/13/18      PT LONG TERM GOAL #4   Title  Pt will report 50% reduction in symptoms of pelvic discomfort    Time  6    Period  Weeks    Status  New    Target Date  05/13/18             Plan - 04/01/18 1050    Clinical Impression Statement  Pt presents to skilled PT due to vaginal discomfort, dyspareunia, and wanting to get back to exercise program.  She is having a little leakage daily and urinary urgency.  Pt has MMT pelvic floor of 3+/5 and able to hold for 9 seconds.  Pt has core strength 3/5 with difficulty keeping pelvis level during SLR.  Pt has improved SLR especially Lt side with pelvic compression.  Pt has hamstring flexibility redued by 50% with PSLR to 45 deg.  Pt has muscle spasm in levator ani muscles and bulbocavernosis.  She has fascial restrictions bilateral urethra and decreased urethra mobility.  Pt has difficutly relaxing after performing pelvic floor contraction. Pt will benefit from skilled PT to improve coordination and address all impairments mentioned in objective section for optimum return to funcitonal and recreational activiites.    Clinical Presentation  Evolving    Clinical Presentation due to:  symptoms have gotten worse over the last few years    Clinical Decision Making  Low    Rehab Potential  Excellent    PT Frequency  1x / week    PT Duration  6 weeks    PT Treatment/Interventions  ADLs/Self Care Home Management;Biofeedback;Cryotherapy;Electrical Stimulation;Moist Heat;Therapeutic exercise;Therapeutic activities;Neuromuscular re-education;Patient/family education;Manual techniques;Passive range of motion;Dry needling;Taping    PT Next Visit Plan  core strength, review intial HEP, urge to void and toileting techniques, lumbar and hip stretch and breathing, start on nustep warm up    PT Home Exercise Plan  Access Code: [email or web address]     Recommended Other Services  eval 04/01/18    Consulted and Agree with Plan of  Care  Patient       Patient will benefit from skilled therapeutic intervention in order to improve the following deficits and impairments:  Pain, Increased fascial restricitons, Impaired tone, Increased muscle spasms, Decreased strength, Decreased range of motion, Impaired flexibility  Visit Diagnosis: Other lack of coordination  Cramp and spasm  Muscle weakness (generalized)     Problem List Patient Active Problem List   Diagnosis Date Noted  . Morbid obesity with body mass index (BMI) of 40.0 to 49.9 ([place]) 09/17/2017  . Genetic testing 03/25/2017  . Malignant 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, estrogen receptor positive ([place]) 01/23/2017  . 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 [place], [place], [address] Phone: [phone]   Fax:  [phone]  Name: ANAIRA SEAY MRN: [id number] Date of Birth: [birth year]

## 2018-04-05 ENCOUNTER — Inpatient Hospital Stay: Payer: 59

## 2018-04-05 ENCOUNTER — Other Ambulatory Visit: Payer: Self-pay | Admitting: Oncology

## 2018-04-05 ENCOUNTER — Ambulatory Visit: Payer: 59 | Admitting: Physical Therapy

## 2018-04-05 ENCOUNTER — Encounter: Payer: Self-pay | Admitting: Physical Therapy

## 2018-04-05 VITALS — BP 136/80 | HR 92 | Temp 98.2°F | Resp 18

## 2018-04-05 DIAGNOSIS — Z5111 Encounter for antineoplastic chemotherapy: Secondary | ICD-10-CM | POA: Diagnosis not present

## 2018-04-05 DIAGNOSIS — R278 Other lack of coordination: Secondary | ICD-10-CM | POA: Diagnosis not present

## 2018-04-05 DIAGNOSIS — R252 Cramp and spasm: Secondary | ICD-10-CM

## 2018-04-05 DIAGNOSIS — C50812 Malignant neoplasm of overlapping sites of left female breast: Secondary | ICD-10-CM | POA: Diagnosis not present

## 2018-04-05 DIAGNOSIS — M6281 Muscle weakness (generalized): Secondary | ICD-10-CM

## 2018-04-05 DIAGNOSIS — Z170 Estrogen receptor positive status [ER+]: Principal | ICD-10-CM

## 2018-04-05 DIAGNOSIS — C773 Secondary and unspecified malignant neoplasm of axilla and upper limb lymph nodes: Secondary | ICD-10-CM | POA: Diagnosis not present

## 2018-04-05 MED ORDER — GOSERELIN ACETATE 3.6 MG ~~LOC~~ IMPL
3.6000 mg | DRUG_IMPLANT | Freq: Once | SUBCUTANEOUS | Status: AC
  Administered 2018-04-05: 3.6 mg via SUBCUTANEOUS

## 2018-04-05 NOTE — Therapy (Signed)
[facility] [address] [place], [place], [address] Phone: [phone]   Fax:  [phone]  Physical Therapy Treatment  Patient Details  Name: Mackenzie [name] MRN: [id number] Date of Birth: 12-08-1970 Referring Provider (PT): Magrinat, [name], MD   Encounter Date: 04/05/2018  PT End of Session - 04/05/18 1035    Visit Number  2    Date for PT Re-Evaluation  05/13/18    PT Start Time  1022    PT Stop Time  1100    PT Time Calculation (min)  38 min    Activity Tolerance  Patient tolerated treatment well       Past Medical History:  Diagnosis Date  . Anemia   . Breast cancer ([place]) 01/14/2017   Left breast  . Diabetes mellitus    GDM    Past Surgical History:  Procedure Laterality Date  . BREAST RECONSTRUCTION WITH PLACEMENT OF TISSUE EXPANDER AND FLEX HD (ACELLULAR HYDRATED DERMIS) Left 08/27/2017   Procedure: LEFT BREAST RECONSTRUCTION WITH PLACEMENT OF TISSUE EXPANDER AND FLEX HD;  Surgeon: Wallace Going, DO;  Location: [place];  Service: Plastics;  Laterality: Left;  . CESAREAN SECTION     x2  . MASTECTOMY MODIFIED RADICAL Left 08/27/2017  . MASTECTOMY MODIFIED RADICAL Left 08/27/2017   Procedure: LEFT MODIFIED RADICAL MASTECTOMY;  Surgeon: Erroll Luna, MD;  Location: [place];  Service: General;  Laterality: Left;  . PORT-A-CATH REMOVAL Right 08/27/2017   Procedure: REMOVAL PORT-A-CATH RIGHT CHEST;  Surgeon: Erroll Luna, MD;  Location: [place];  Service: General;  Laterality: Right;  . PORTACATH PLACEMENT Right 01/28/2017   Procedure: INSERTION PORT-A-CATH WITH ULTRASOUND;  Surgeon: Erroll Luna, MD;  Location: [place];  Service: General;  Laterality: Right;  . TUBAL LIGATION Bilateral 01/21/2011    There were no vitals filed for this visit.  Subjective Assessment - 04/05/18 1024    Subjective  Pt states she did the self massage and the exercises.  No difficulties.  Pt  states still has urgency and had a little leakage since last visit    Currently in Pain?  No/denies                       [facility] Adult PT Treatment/Exercise - 04/05/18 0001      Self-Care   Self-Care  Other Self-Care Comments    Other Self-Care Comments   educated on urge to void and toileting techniques      Neuro Re-ed    Neuro Re-ed Details   engaging pelvic floor and TrA      Exercises   Exercises  Lumbar      Lumbar Exercises: Stretches   Active Hamstring Stretch  Right;Left;3 reps;30 seconds      Lumbar Exercises: Supine   Ab Set  10 reps;5 seconds   TrA and pelvic floor   Clam  20 reps;5 seconds   red band   Bridge with Ball Squeeze  10 reps;5 seconds    Large Ball Abdominal Isometric  20 reps   ball roll out     Lumbar Exercises: Sidelying   Clam  Right;Left;15 reps             PT Education - 04/05/18 1101    Education Details   Access Code: [email or web address] toilet and urge techniques    Person(s) Educated  Patient    Methods  Explanation;Demonstration;Handout    Comprehension  Verbalized understanding;Returned demonstration  PT Long Term Goals - 04/05/18 1042      PT LONG TERM GOAL #1   Title  Pt. will be independent in HEP    Status  On-going      PT LONG TERM GOAL #2   Title  pt will report 50% less leakage due to increased strength and endurance of pelvic floor    Status  On-going      PT LONG TERM GOAL #3   Title  pt will be able to attempt intercourse due to improved soft tissue pliabiliity and health    Status  On-going      PT LONG TERM GOAL #4   Title  Pt will report 50% reduction in symptoms of pelvic discomfort    Status  On-going            Plan - 04/05/18 1036    Clinical Impression Statement  No goals met yet due to initial treatment.  Pt able to understand self care techniques and progress with additions to HEP.  Pt needed exercises to initiate TrA and pelvic floor togetherShe will benefit from skilled  PT to progress strength for improved activities.    PT Treatment/Interventions  ADLs/Self Care Home Management;Biofeedback;Cryotherapy;Electrical Stimulation;Moist Heat;Therapeutic exercise;Therapeutic activities;Neuromuscular re-education;Patient/family education;Manual techniques;Passive range of motion;Dry needling;Taping    PT Next Visit Plan  core strength, review intial HEP, lumbar and hip stretch and breathing, start on nustep warm up    PT Home Exercise Plan  Access Code: [email or web address]     Recommended Other Services  eval 70/96/28 cert signed    Consulted and Agree with Plan of Care  Patient       Patient will benefit from skilled therapeutic intervention in order to improve the following deficits and impairments:  Pain, Increased fascial restricitons, Impaired tone, Increased muscle spasms, Decreased strength, Decreased range of motion, Impaired flexibility  Visit Diagnosis: Other lack of coordination  Cramp and spasm  Muscle weakness (generalized)     Problem List Patient Active Problem List   Diagnosis Date Noted  . Morbid obesity with body mass index (BMI) of 40.0 to 49.9 ([place]) 09/17/2017  . Genetic testing 03/25/2017  . Malignant 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, estrogen receptor positive ([place]) 01/23/2017  . 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 [place], [place], [address] Phone: [phone]   Fax:  [phone]  Name: Mackenzie [name] MRN: [id number] Date of Birth: [birth year]

## 2018-04-05 NOTE — Patient Instructions (Signed)
Relaxation Exercises with the Urge to Void   When you experience an urge to void:  FIRST  Stop and stand very still    Sit down if you can    Don't move    You need to stay very still to maintain control  SECOND Squeeze your pelvic floor muscles 5 times, like a quick flick, to keep from leaking  THIRD Relax  Take a deep breath and then let it out  Try to make the urge go away by using relaxation and visualization techniques  FINALLY When you feel the urge go away somewhat, walk normally to the bathroom.   If the urge gets suddenly stronger on the way, you may stop again and relax to regain control.  Toileting Techniques for Bowel Movements (Defecation) Using your belly (abdomen) and pelvic floor muscles to have a bowel movement is usually instinctive.  Sometimes people can have problems with these muscles and have to relearn proper defecation (emptying) techniques.  If you have weakness in your muscles, organs that are falling out, decreased sensation in your pelvis, or ignore your urge to go, you may find yourself straining to have a bowel movement.  You are straining if you are: . holding your breath or taking in a huge gulp of air and holding it  . keeping your lips and jaw tensed and closed tightly . turning red in the face because of excessive pushing or forcing . developing or worsening your  hemorrhoids . getting faint while pushing . not emptying completely and have to defecate many times a day  If you are straining, you are actually making it harder for yourself to have a bowel movement.  Many people find they are pulling up with the pelvic floor muscles and closing off instead of opening the anus. Due to lack pelvic floor relaxation and coordination the abdominal muscles, one has to work harder to push the feces out.  Many people have never been taught how to defecate efficiently and effectively.  Notice what happens to your body when you are having a bowel movement.  While  you are sitting on the toilet pay attention to the following areas: . Jaw and mouth position . Angle of your hips   . Whether your feet touch the ground or not . Arm placement  . Spine position . Waist . Belly tension . Anus (opening of the anal canal)  An Evacuation/Defecation Plan   Here are the 4 basic points:  1. Lean forward enough for your elbows to rest on your knees 2. Support your feet on the floor or use a low stool if your feet don't touch the floor  3. Push out your belly as if you have swallowed a beach ball-you should feel a widening of your waist 4. Open and relax your pelvic floor muscles, rather than tightening around the anus      The following conditions my require modifications to your toileting posture:  . If you have had surgery in the past that limits your back, hip, pelvic, knee or ankle flexibility . Constipation   Your healthcare practitioner may make the following additional suggestions and adjustments:  1) Sit on the toilet  a) Make sure your feet are supported. b) Notice your hip angle and spine position-most people find it effective to lean forward or raise their knees, which can help the muscles around the anus to relax  c) When you lean forward, place your forearms on your thighs for support  2) Relax suggestions a) Breath deeply in through your nose and out slowly through your mouth as if you are smelling the flowers and blowing out the candles. b) To become aware of how to relax your muscles, contracting and releasing muscles can be helpful.  Pull your pelvic floor muscles in tightly by using the image of holding back gas, or closing around the anus (visualize making a circle smaller) and lifting the anus up and in.  Then release the muscles and your anus should drop down and feel open. Repeat 5 times ending with the feeling of relaxation. c) Keep your pelvic floor muscles relaxed; let your belly bulge out. d) The digestive tract starts at the  mouth and ends at the anal opening, so be sure to relax both ends of the tube.  Place your tongue on the roof of your mouth with your teeth separated.  This helps relax your mouth and will help to relax the anus at the same time.  3) Empty (defecation) a) Keep your pelvic floor and sphincter relaxed, then bulge your anal muscles.  Make the anal opening wide.  b) Stick your belly out as if you have swallowed a beach ball. c) Make your belly wall hard using your belly muscles while continuing to breathe. Doing this makes it easier to open your anus. d) Breath out and give a grunt (or try using other sounds such as ahhhh, shhhhh, ohhhh or grrrrrrr).  4) Finish a) As you finish your bowel movement, pull the pelvic floor muscles up and in.  This will leave your anus in the proper place rather than remaining pushed out and down. If you leave your anus pushed out and down, it will start to feel as though that is normal and give you incorrect signals about needing to have a bowel movement.   [facility] [address] [place], [place] [address]

## 2018-04-05 NOTE — Progress Notes (Signed)
[place]  [phone] [phone]     ID: Mackenzie [name] DOB: 01-04-1971  MR#: [id number]  PJK#:[id number]  Patient Care Team: Willey Blade, MD as PCP - General (Internal Medicine) Jospeh Mangel, [name], MD as Consulting Physician (Oncology) Erroll Luna, MD as Consulting Physician (General Surgery) Kyung Rudd, MD as Consulting Physician (Radiation Oncology) Dillingham, Loel Lofty, DO as Attending Physician (Plastic Surgery) OTHER MD:  CHIEF COMPLAINT: Estrogen receptor positive breast cancer  CURRENT TREATMENT: letrozole, goserelin   HISTORY OF CURRENT ILLNESS: From the original intake note:  The patient herself noted some changes in her left breast late July 2018, she says, and eventually brought this to medical attention so that on 01/09/2017 she underwent bilateral diagnostic mammography with tomography and left breast ultrasonography at the breast Center. This found the breast density to be category C. In the upper inner quadrant of the left breast there was an area of asymmetry and there were malignant type calcifications involving all 4 quadrants. On exam there is firmness and palpable thickening in the anterior left breast with skin dimpling. Ultrasonography found at the 9:30 o'clock radiant 3 cm from the nipple a 2.7 cm mass and in the left axilla for abnormal lymph nodes largest of which measured 2.7 cm.  Biopsy of the left breast 9:30 o'clock mass 80 01/26/2017 showed ([id number]) invasive ductal carcinoma, with extracellular mucin, grade 1 or 2. In the lower outer left breast is separated biopsy the same day showed ductal carcinoma in situ. One of the 4 lymph nodes involved was positive for metastatic carcinoma. Prognostic panel on the invasive disease showed it to be estrogen receptor 100% positive, progesterone receptor 20% positive, both with strong staining intensity, with an MIB-1 of 20%, and no HER-2 amplification with a signals  ratio 1.38 and the number per cell 2.90.  On 01/22/2017 the patient underwent bilateral breast MRI. This showed no involvement of the right breast, but in the left breast there was a masslike and non-masslike enhancement involving all quadrants, with skin swelling but no abnormal enhancement of the skin or nipple areolar complex or pectoralis muscle. The mass could not be clearly measured but spanned approximately 12 cm. There was bulky left axillary lymphadenopathy, with the largest lymph node measuring up to 3.4 cm.  The patient's subsequent history is as detailed below.  INTERVAL HISTORY: Mackenzie [name] returns today for evaluation and treatment of her estrogen receptor positive breast cancer. She continues on letrozole, with good tolerance. She has occasional night hot flashes that do not wake her up, but she notices some sweating if she does wake up. She also has vaginal dryness, and she is interested in the pelvic health program. She also has arthralgias in her legs that are getting worse.   She also receives goserelin every 4 weeks, with a dose due today. She tolerates this well without any side effects.   She is scheduled for mammography and bone density on 03/16/2018 at [place].  She qualifies for the [facility] study and also for the aspirin study but for some reason she did not return our research nurses phone messages.  However today she tells me that she is interested in the study and would like to meet with the research nurse if possible.    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 She also takes care of her children during the day.  She is pretty tired.  She has occasional daytime headaches. She  denies  visual changes, nausea, vomiting, or dizziness. There has been no unusual cough, phlegm production, or pleurisy. There has been no change in bowel or bladder habits. She denies unexplained fatigue or unexplained weight loss, bleeding, rash, or  fever. A detailed review of systems was otherwise stable.    PAST MEDICAL HISTORY: Past Medical History:  Diagnosis Date  . Anemia   . Breast cancer ([place]) 01/14/2017   Left breast  . Diabetes mellitus    GDM    PAST SURGICAL HISTORY: Past Surgical History:  Procedure Laterality Date  . BREAST RECONSTRUCTION WITH PLACEMENT OF TISSUE EXPANDER AND FLEX HD (ACELLULAR HYDRATED DERMIS) Left 08/27/2017   Procedure: LEFT BREAST RECONSTRUCTION WITH PLACEMENT OF TISSUE EXPANDER AND FLEX HD;  Surgeon: Wallace Going, DO;  Location: [place];  Service: Plastics;  Laterality: Left;  . CESAREAN SECTION     x2  . MASTECTOMY MODIFIED RADICAL Left 08/27/2017  . MASTECTOMY MODIFIED RADICAL Left 08/27/2017   Procedure: LEFT MODIFIED RADICAL MASTECTOMY;  Surgeon: Erroll Luna, MD;  Location: [place];  Service: General;  Laterality: Left;  . PORT-A-CATH REMOVAL Right 08/27/2017   Procedure: REMOVAL PORT-A-CATH RIGHT CHEST;  Surgeon: Erroll Luna, MD;  Location: [place];  Service: General;  Laterality: Right;  . PORTACATH PLACEMENT Right 01/28/2017   Procedure: INSERTION PORT-A-CATH WITH ULTRASOUND;  Surgeon: Erroll Luna, MD;  Location: [place];  Service: General;  Laterality: Right;  . TUBAL LIGATION Bilateral 01/21/2011    FAMILY HISTORY Family History  Problem Relation Age of Onset  . Breast cancer Paternal Grandmother 33       d.60s from breast cancer. Did not have treatment.  . Other Mother        G.28 from complications of surgery to remove brain tumor  The patient's father still alive at age 107. The patient's mother died with a brain tumor which the patient says was "benign". She was 56. The patient has one brother, no sisters. The only breast cancer in the family is a paternal grandmother who died from breast cancer at an unknown age. There is no history of ovarian or prostate cancer in the family.  GYNECOLOGIC HISTORY:  No LMP recorded. (Menstrual status:  Chemotherapy). Menarche age 55 and first live birth age 68 she is [place] P4. The person is still having regular periods as of August 2018  SOCIAL HISTORY: (As of August 2018) Mackenzie [name] works at [employer] on the fifth floor, [place]. Her husband, [name], is a Building control surveyor. Daughter is married and lives in [place] and works as a Education administrator. Daughter, [name] lives in [place] and works in the police department. Daughters, Brandon and Bardwell are 68 and 6, living at home. The patient has no grandchildren. She attends a Tour manager   ADVANCED DIRECTIVES: Not in place   HEALTH MAINTENANCE: Social History   Tobacco Use  . Smoking status: Never Smoker  . Smokeless tobacco: Never Used  Substance Use Topics  . Alcohol use: Yes    Comment: social  . Drug use: No     Colonoscopy: Never  PAP: December 2017  Bone density: Never   No Known Allergies  Current Outpatient Medications  Medication Sig Dispense Refill  . acetaminophen (TYLENOL) 500 MG tablet Take 1,000 mg by mouth every 6 (six) hours as needed (for pain/headaches.).    Marland Kitchen B Complex Vitamins (VITAMIN B COMPLEX PO) Take 1 capsule by mouth 2 (two) times daily.     . benzonatate (TESSALON) 100 MG capsule  Take 100 mg by mouth every 8 (eight) hours as needed for cough.   0  . Calcium Carb-Cholecalciferol (CALCIUM 600+D3 PO) Take 1 tablet by mouth 2 (two) times daily.    . cetirizine (ZYRTEC) 10 MG tablet Take 10 mg by mouth daily.      . Cholecalciferol (VITAMIN D-3) 5000 units TABS Take 5,000 Units by mouth 2 (two) times daily.    Marland Kitchen gabapentin (NEURONTIN) 300 MG capsule Take 1 capsule (300 mg total) by mouth at bedtime. 90 capsule 4  . ibuprofen (ADVIL,MOTRIN) 200 MG tablet Take 800 mg by mouth every 8 (eight) hours as needed (for pain.).    Marland Kitchen letrozole (FEMARA) 2.5 MG tablet Take 1 tablet (2.5 mg total) by mouth daily. 30 tablet 5  . loratadine (CLARITIN) 10 MG tablet Take 1 tablet (10 mg total) by mouth daily. (Patient  not taking: Reported on 03/01/2018) 90 tablet 2  . montelukast (SINGULAIR) 10 MG tablet Take 10 mg by mouth daily as needed (for allergies.).     Marland Kitchen Multiple Vitamin (MULTIVITAMIN WITH MINERALS) TABS tablet Take 1 tablet by mouth daily.    . Omega-3 Fatty Acids (FISH OIL) 1000 MG CAPS Take 2,000 mg by mouth daily.     Marland Kitchen PREVIDENT 5000 BOOSTER PLUS 1.1 % PSTE BRUSH ONCE A DAY IN PLACE OF REGULAR TOOTHPASTE  2  . prochlorperazine (COMPAZINE) 10 MG tablet Take 10 mg by mouth every 6 (six) hours as needed for nausea or vomiting.   1  . senna (SENOKOT) 8.6 MG TABS tablet Take 1 tablet (8.6 mg total) by mouth 2 (two) times daily. (Patient not taking: Reported on 03/01/2018) 120 each 0   No current facility-administered medications for this visit.    Facility-Administered Medications Ordered in Other Visits  Medication Dose Route Frequency Provider Last Rate Last Dose  . sodium chloride flush (NS) 0.9 % injection 10 mL  10 mL Intravenous PRN Jalyn Dutta, [name], MD   10 mL at 03/10/17 1239    OBJECTIVE: Middle-aged African-American woman in no acute distress There were no vitals filed for this visit.   There is no height or weight on file to calculate BMI.   Wt Readings from Last 3 Encounters:  03/08/18 271 lb 8 oz (123.2 kg)  03/01/18 273 lb 6.4 oz (124 kg)  01/11/18 276 lb 4.8 oz (125.3 kg)   ECOG FS: 1 - Symptomatic but completely ambulatory  Sclerae unicteric, pupils round and equal No cervical or supraclavicular adenopathy Lungs no rales or rhonchi Heart regular rate and rhythm Abd soft, nontender, positive bowel sounds MSK no focal spinal tenderness, no upper extremity lymphedema Neuro: nonfocal, well oriented, appropriate affect Breasts: The right breast is benign.  The left breast is status post mastectomy and radiation, with expander in place.  There is no evidence of local recurrence.  Both axillae are benign.    LAB RESULTS:    No visits with results within 3 Day(s) from this  visit.  Latest known visit with results is:  Appointment on 03/08/2018  Component Date Value Ref Range Status  . Sodium 03/08/2018 138  135 - 145 mmol/L Final  . Potassium 03/08/2018 3.5  3.5 - 5.1 mmol/L Final  . Chloride 03/08/2018 103  98 - 111 mmol/L Final  . CO2 03/08/2018 29  22 - 32 mmol/L Final  . Glucose, Bld 03/08/2018 119* 70 - 99 mg/dL Final  . BUN 03/08/2018 13  6 - 20 mg/dL Final  . Creatinine, Ser 03/08/2018  0.82  0.44 - 1.00 mg/dL Final  . Calcium 03/08/2018 9.4  8.9 - 10.3 mg/dL Final  . Total Protein 03/08/2018 7.6  6.5 - 8.1 g/dL Final  . Albumin 03/08/2018 3.6  3.5 - 5.0 g/dL Final  . AST 03/08/2018 16  15 - 41 U/L Final  . ALT 03/08/2018 22  0 - 44 U/L Final  . Alkaline Phosphatase 03/08/2018 127* 38 - 126 U/L Final  . Total Bilirubin 03/08/2018 0.2* 0.3 - 1.2 mg/dL Final  . GFR calc non Af Amer 03/08/2018 >60  >60 mL/min Final  . GFR calc Af Amer 03/08/2018 >60  >60 mL/min Final   Comment: (NOTE) The eGFR has been calculated using the CKD EPI equation. This calculation has not been validated in all clinical situations. eGFR's persistently <60 mL/min signify possible Chronic Kidney Disease.   Mackenzie [name] gap 03/08/2018 6  5 - 15 Final   Performed at [facility], [place] [address], [place], [place] [address]  . WBC 03/08/2018 5.0  3.9 - 10.3 K/uL Final  . RBC 03/08/2018 4.12  3.70 - 5.45 MIL/uL Final  . Hemoglobin 03/08/2018 11.2* 11.6 - 15.9 g/dL Final  . HCT 03/08/2018 34.3* 34.8 - 46.6 % Final  . MCV 03/08/2018 83.2  79.5 - 101.0 fL Final  . MCH 03/08/2018 27.3  25.1 - 34.0 pg Final  . MCHC 03/08/2018 32.8  31.5 - 36.0 g/dL Final  . RDW 03/08/2018 15.7* 11.2 - 14.5 % Final  . Platelets 03/08/2018 268  145 - 400 K/uL Final  . Neutrophils Relative % 03/08/2018 67  % Final  . Neutro Abs 03/08/2018 3.4  1.5 - 6.5 K/uL Final  . Lymphocytes Relative 03/08/2018 19  % Final  . Lymphs Abs 03/08/2018 0.9  0.9 - 3.3 K/uL Final  . Monocytes  Relative 03/08/2018 12  % Final  . Monocytes Absolute 03/08/2018 0.6  0.1 - 0.9 K/uL Final  . Eosinophils Relative 03/08/2018 1  % Final  . Eosinophils Absolute 03/08/2018 0.1  0.0 - 0.5 K/uL Final  . Basophils Relative 03/08/2018 1  % Final  . Basophils Absolute 03/08/2018 0.0  0.0 - 0.1 K/uL Final   Performed at [facility], [place] [address], [place], [place] [address]       STUDIES: Scheduled for mammography and bone density on 03/16/2018 at [place].  ELIGIBLE FOR AVAILABLE RESEARCH PROTOCOL: [name], ASA study  ASSESSMENT: 47 y.o. [place] woman status post left breast upper inner quadrant biopsy 01/14/2017 for a clinical T3 N2, stage IIA invasive ductal carcinoma, grade 1 or 2, estrogen and progesterone receptor positive, HER-2 nonamplified, with an MIB-1 of 20%.  (1) staging studies: Brain MRI, bone scan, and CT scan of the chest 02/05/2017 showed no brain lesions, no lung or liver lesions, a 4.9 cm mass in the left breast with left axillary and subpectoral adenopathy, and nonspecific bone scan tracer at L2, left scapula, and anterior ribs, with lumbar spine MRI suggested for further evaluation.  (a) lumbar spine MRI 02/17/2017 showed no normal bone lesions.  There was mild lumbar spondylosis  (2) neoadjuvant chemotherapy consisting of cyclophosphamide and doxorubicin in dose dense fashion 4 started 02/10/2017, completed 03/24/2017, followed by weekly carboplatin and gemcitabine given days 1 and 8 of each 21-day cycle starting 04/14/2017, completing the planned 4 cycles 06/26/2017  (3) is post left modified radical mastectomy on 08/27/2017 showing an mpT3 pN2 residual invasive ductal carcinoma, grade 2, with a residual cancer burden  of 3.  Margins were clear  (a) TRAM flap reconstruction planned  (4) postmastectomy radiation completed 01/01/2018  (a) capecitabine radiosensitization 11/16/2017-01/01/2018  (5) goserelin started 09/21/2017  (a)  letrozole started 01/11/2018  (6) genetics testing 03/24/2017 through the Common Hereditary Cancer Panel offered by [facility] found no deleterious mutations in APC, ATM, AXIN2, BARD1, BMPR1A, BRCA1, BRCA2, BRIP1, CDH1, CDKN2A (p14ARF), CDKN2A (p16INK4a), CHEK2, CTNNA1, DICER1, EPCAM (Deletion/duplication testing only), GREM1 (promoter region deletion/duplication testing only), KIT, MEN1, MLH1, MSH2, MSH3, MSH6, MUTYH, NBN, NF1, NHTL1, PALB2, PDGFRA, PMS2, POLD1, POLE, PTEN, RAD50, RAD51C, RAD51D, SDHB, SDHC, SDHD, SMAD4, SMARCA4. STK11, TP53, TSC1, TSC2, and VHL.  The following genes were evaluated for sequence changes only: SDHA and HOXB13 c.251G>A variant only.    PLAN: Mackenzie [name] is experiencing menopausal symptoms as expected.  The worst problem is vaginal dryness and I am referring her to the pelvic health physical therapy program.  I think she will greatly benefit from that.  She is also having hot flashes both at night and during the day.  I think she will benefit from gabapentin at bedtime.  She may need venlafaxine added and I have asked her to let me know after about 2 weeks from the gabapentin whether she feels a little more support is needed.  However she is also having some aches and pains which are more than she had before.  She has a pretty busy schedule working here and at home.  However it could well be that the aromatase inhibitor is worsening that.  If so we would switch her to tamoxifen.  She is meeting with our research nurse Manuela Schwartz today.  Hopefully we will be able to enroll her in the [facility] study.  She also will qualify for the aspirin study I believe.  She will see me again in 8 weeks.  She knows to call for any other issues that may develop before that visit.  Sofya Moustafa, [name], MD  04/05/18 3:39 PM Medical Oncology and Hematology [facility] [address] [place], [place] [address] Tel. [phone]    Fax. [phone]  Alice Rieger, am acting as  scribe for Chauncey Cruel MD.  I, Lurline Del MD, have reviewed the above documentation for accuracy and completeness, and I agree with the above.

## 2018-04-13 DIAGNOSIS — H04123 Dry eye syndrome of bilateral lacrimal glands: Secondary | ICD-10-CM | POA: Diagnosis not present

## 2018-04-13 DIAGNOSIS — H524 Presbyopia: Secondary | ICD-10-CM | POA: Diagnosis not present

## 2018-04-13 DIAGNOSIS — H5213 Myopia, bilateral: Secondary | ICD-10-CM | POA: Diagnosis not present

## 2018-04-13 DIAGNOSIS — H2513 Age-related nuclear cataract, bilateral: Secondary | ICD-10-CM | POA: Diagnosis not present

## 2018-04-13 DIAGNOSIS — H52223 Regular astigmatism, bilateral: Secondary | ICD-10-CM | POA: Diagnosis not present

## 2018-04-14 ENCOUNTER — Ambulatory Visit: Payer: 59 | Attending: Oncology | Admitting: Physical Therapy

## 2018-04-14 DIAGNOSIS — R252 Cramp and spasm: Secondary | ICD-10-CM | POA: Insufficient documentation

## 2018-04-14 DIAGNOSIS — R278 Other lack of coordination: Secondary | ICD-10-CM | POA: Diagnosis not present

## 2018-04-14 DIAGNOSIS — M6281 Muscle weakness (generalized): Secondary | ICD-10-CM | POA: Diagnosis not present

## 2018-04-14 NOTE — Therapy (Signed)
[facility] [address] [place], [place], [address] Phone: [phone]   Fax:  [phone]  Physical Therapy Treatment  Patient Details  Name: DEMARIE UHLIG MRN: [id number] Date of Birth: [birth year] Referring Provider (PT): Magrinat, [name], MD   Encounter Date: 04/14/2018  PT End of Session - 04/14/18 0949    Visit Number  3    Date for PT Re-Evaluation  05/13/18    PT Start Time  0931    PT Stop Time  1012    PT Time Calculation (min)  41 min    Activity Tolerance  Patient tolerated treatment well       Past Medical History:  Diagnosis Date  . Anemia   . Breast cancer ([place]) 01/14/2017   Left breast  . Diabetes mellitus    GDM    Past Surgical History:  Procedure Laterality Date  . BREAST RECONSTRUCTION WITH PLACEMENT OF TISSUE EXPANDER AND FLEX HD (ACELLULAR HYDRATED DERMIS) Left 08/27/2017   Procedure: LEFT BREAST RECONSTRUCTION WITH PLACEMENT OF TISSUE EXPANDER AND FLEX HD;  Surgeon: Wallace Going, DO;  Location: [place];  Service: Plastics;  Laterality: Left;  . CESAREAN SECTION     x2  . MASTECTOMY MODIFIED RADICAL Left 08/27/2017  . MASTECTOMY MODIFIED RADICAL Left 08/27/2017   Procedure: LEFT MODIFIED RADICAL MASTECTOMY;  Surgeon: Erroll Luna, MD;  Location: [place];  Service: General;  Laterality: Left;  . PORT-A-CATH REMOVAL Right 08/27/2017   Procedure: REMOVAL PORT-A-CATH RIGHT CHEST;  Surgeon: Erroll Luna, MD;  Location: [place];  Service: General;  Laterality: Right;  . PORTACATH PLACEMENT Right 01/28/2017   Procedure: INSERTION PORT-A-CATH WITH ULTRASOUND;  Surgeon: Erroll Luna, MD;  Location: [place];  Service: General;  Laterality: Right;  . TUBAL LIGATION Bilateral 01/21/2011    There were no vitals filed for this visit.  Subjective Assessment - 04/14/18 1015    Subjective  Pt report her discomfort and leakage has improved 70-80% less.  Pt has started  walking more.    Currently in Pain?  No/denies                       [facility] Adult PT Treatment/Exercise - 04/14/18 0001      Exercises   Exercises  Lumbar      Lumbar Exercises: Stretches   Active Hamstring Stretch  Right;Left;2 reps;20 seconds    Hip Flexor Stretch  Right;Left;2 reps;20 seconds   on step   Figure 4 Stretch  2 reps;20 seconds;Supine;With overpressure      Lumbar Exercises: Aerobic   Nustep  L1 x 5 min   engage pelvic floor and TrA     Lumbar Exercises: Supine   Large Ball Abdominal Isometric  20 reps   ball roll out   Large Ball Oblique Isometric  10 reps;3 seconds                  PT Long Term Goals - 04/14/18 0945      PT LONG TERM GOAL #1   Title  Pt. will be independent in HEP    Status  On-going      PT LONG TERM GOAL #2   Title  pt will report 50% less leakage due to increased strength and endurance of pelvic floor    Baseline  80% less leakage    Status  Achieved      PT LONG TERM GOAL #3   Title  pt will be able to attempt intercourse due to improved soft tissue pliabiliity and health    Status  On-going      PT LONG TERM GOAL #4   Title  Pt will report 50% reduction in symptoms of pelvic discomfort    Baseline  70% less discomfort    Status  Achieved            Plan - 04/14/18 0946    Clinical Impression Statement  Pt is having much less leakage and discomfort now.  She is doing well with her HEP.  She will benefit from skilled PT to progress core strength for maximum functional outcomes.    PT Treatment/Interventions  ADLs/Self Care Home Management;Biofeedback;Cryotherapy;Electrical Stimulation;Moist Heat;Therapeutic exercise;Therapeutic activities;Neuromuscular re-education;Patient/family education;Manual techniques;Passive range of motion;Dry needling;Taping    PT Next Visit Plan  core strength, review intial HEP, lumbar and hip stretch and breathing, start on nustep warm up    PT Home Exercise Plan   Access Code: [email or web address]     Consulted and Agree with Plan of Care  Patient       Patient will benefit from skilled therapeutic intervention in order to improve the following deficits and impairments:  Pain, Increased fascial restricitons, Impaired tone, Increased muscle spasms, Decreased strength, Decreased range of motion, Impaired flexibility  Visit Diagnosis: Other lack of coordination  Cramp and spasm  Muscle weakness (generalized)     Problem List Patient Active Problem List   Diagnosis Date Noted  . Morbid obesity with body mass index (BMI) of 40.0 to 49.9 ([place]) 09/17/2017  . Genetic testing 03/25/2017  . Malignant 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, estrogen receptor positive ([place]) 01/23/2017  . 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, [place], [place], [address] Phone: [phone]   Fax:  [phone]  Name: ALEIYAH HALPIN MRN: [id number] Date of Birth: [birth year]

## 2018-04-20 MED FILL — LETROZOLE 2.5 MG TABLET: 2.5 | 30 days supply | Qty: 30 | Fill #3

## 2018-04-27 ENCOUNTER — Ambulatory Visit: Payer: 59 | Admitting: Physical Therapy

## 2018-04-27 DIAGNOSIS — R252 Cramp and spasm: Secondary | ICD-10-CM

## 2018-04-27 DIAGNOSIS — M6281 Muscle weakness (generalized): Secondary | ICD-10-CM | POA: Diagnosis not present

## 2018-04-27 DIAGNOSIS — R278 Other lack of coordination: Secondary | ICD-10-CM

## 2018-04-27 NOTE — Patient Instructions (Signed)
Access Code: [email or web address]  URL: [email or web address]  Date: 04/27/2018  Prepared by: Lovett Calender   Exercises  Seated Hamstring Stretch - 3 reps - 1 sets - 30 sec hold - 1x daily - 7x weekly  Supine Figure 4 Piriformis Stretch - 3 reps - 1 sets - 30 sec hold - 1x daily - 7x weekly  Supine Pelvic Floor Contraction - 5 reps - 1 sets - 10 sec hold - 3x daily - 7x weekly  Clamshell - 10 reps - 2 sets - 1x daily - 7x weekly  Hooklying clam with kegel - 10 reps - 2 sets - 1x daily - 7x weekly  Supine Bridge with Pelvic Floor Contraction - 10 reps - 2 sets - 1x daily - 7x weekly  Standing Hip Abduction - 10 reps - 2 sets - 1x daily - 7x weekly  Standing Hip Extension - 10 reps - 2 sets - 1x daily - 7x weekly  Hip Flexor Stretch on Step - 3 reps - 1 sets - 30 sec hold - 1x daily - 7x weekly  Sit to Stand with Pelvic Floor Contraction - 10 reps - 1 sets - 3x daily - 7x weekly  Seated Pelvic Floor Contraction with Isometric Hip Adduction - 10 reps - 3 sets - 1x daily - 7x weekly

## 2018-04-27 NOTE — Therapy (Signed)
[facility] [address], [place], [place], [address] Phone: [phone]   Fax:  [phone]  Physical Therapy Treatment  Patient Details  Name: Mackenzie [name] MRN: [id number] Date of Birth: 02-10-1971 Referring Provider (PT): Magrinat, Mackenzie Dad, MD   Encounter Date: 04/27/2018  PT End of Session - 04/27/18 1514    Visit Number  4    Date for PT Re-Evaluation  05/13/18    PT Start Time  1446    PT Stop Time  1528    PT Time Calculation (min)  42 min    Activity Tolerance  Patient tolerated treatment well       Past Medical History:  Diagnosis Date  . Anemia   . Breast cancer ([place]) 01/14/2017   Left breast  . Diabetes mellitus    GDM    Past Surgical History:  Procedure Laterality Date  . BREAST RECONSTRUCTION WITH PLACEMENT OF TISSUE EXPANDER AND FLEX HD (ACELLULAR HYDRATED DERMIS) Left 08/27/2017   Procedure: LEFT BREAST RECONSTRUCTION WITH PLACEMENT OF TISSUE EXPANDER AND FLEX HD;  Surgeon: Wallace Going, DO;  Location: [place];  Service: Plastics;  Laterality: Left;  . CESAREAN SECTION     x2  . MASTECTOMY MODIFIED RADICAL Left 08/27/2017  . MASTECTOMY MODIFIED RADICAL Left 08/27/2017   Procedure: LEFT MODIFIED RADICAL MASTECTOMY;  Surgeon: Erroll Luna, MD;  Location: [place];  Service: General;  Laterality: Left;  . PORT-A-CATH REMOVAL Right 08/27/2017   Procedure: REMOVAL PORT-A-CATH RIGHT CHEST;  Surgeon: Erroll Luna, MD;  Location: [place];  Service: General;  Laterality: Right;  . PORTACATH PLACEMENT Right 01/28/2017   Procedure: INSERTION PORT-A-CATH WITH ULTRASOUND;  Surgeon: Erroll Luna, MD;  Location: [place];  Service: General;  Laterality: Right;  . TUBAL LIGATION Bilateral 01/21/2011    There were no vitals filed for this visit.  Subjective Assessment - 04/27/18 1529    Subjective  I'm doing good and doing my exercises 2-3x / week and building up work.    Currently in Pain?  No/denies                       [facility] Adult PT Treatment/Exercise - 04/27/18 0001      Exercises   Exercises  Lumbar      Lumbar Exercises: Stretches   Active Hamstring Stretch  Right;Left;2 reps;20 seconds      Lumbar Exercises: Aerobic   Nustep  L1 x 8 min   engage pelvic floor and TrA     Lumbar Exercises: Standing   Functional Squats  20 reps   with pelvic and TrA bracing   Other Standing Lumbar Exercises  hip abd, ext - 2 x 10    Other Standing Lumbar Exercises  one LE on black disk; warrier I with UE flexion red weighted ball      Lumbar Exercises: Supine   Bridge  15 reps    Bridge Limitations  on [employer] Abdominal Isometric  20 reps   ball roll out and UE flexion            PT Education - 04/27/18 1526    Education Details   Access Code: [email or web address]     Person(s) Educated  Patient    Methods  Explanation;Demonstration;Handout;Verbal cues    Comprehension  Verbalized understanding;Returned demonstration          PT Long Term Goals - 04/14/18 0945  PT LONG TERM GOAL #1   Title  Pt. will be independent in HEP    Status  On-going      PT LONG TERM GOAL #2   Title  pt will report 50% less leakage due to increased strength and endurance of pelvic floor    Baseline  80% less leakage    Status  Achieved      PT LONG TERM GOAL #3   Title  pt will be able to attempt intercourse due to improved soft tissue pliabiliity and health    Status  On-going      PT LONG TERM GOAL #4   Title  Pt will report 50% reduction in symptoms of pelvic discomfort    Baseline  70% less discomfort    Status  Achieved            Plan - 04/27/18 1520    Clinical Impression Statement  Pt is doing well with HEP.  She continues to have a little leakage and has been unable to attempt intercourse due to relationship issues not pain.  Pt initiated more functional movements during strengthening and will benefit from continued  skilled PT to progress.    PT Treatment/Interventions  ADLs/Self Care Home Management;Biofeedback;Cryotherapy;Electrical Stimulation;Moist Heat;Therapeutic exercise;Therapeutic activities;Neuromuscular re-education;Patient/family education;Manual techniques;Passive range of motion;Dry needling;Taping    PT Next Visit Plan  functional movements, core and pelvic floor strength in variety of positions and postures, lumbar and hip stretches    PT Home Exercise Plan  Access Code: [email or web address]     Consulted and Agree with Plan of Care  Patient       Patient will benefit from skilled therapeutic intervention in order to improve the following deficits and impairments:  Pain, Increased fascial restricitons, Impaired tone, Increased muscle spasms, Decreased strength, Decreased range of motion, Impaired flexibility  Visit Diagnosis: Other lack of coordination  Cramp and spasm  Muscle weakness (generalized)     Problem List Patient Active Problem List   Diagnosis Date Noted  . Morbid obesity with body mass index (BMI) of 40.0 to 49.9 ([place]) 09/17/2017  . Genetic testing 03/25/2017  . Malignant 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, estrogen receptor positive ([place]) 01/23/2017  . 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, [place], [place], [address] Phone: [phone]   Fax:  [phone]  Name: Mackenzie [name] MRN: [id number] Date of Birth: 04-07-1971

## 2018-04-30 ENCOUNTER — Telehealth: Payer: Self-pay | Admitting: *Deleted

## 2018-04-30 NOTE — Telephone Encounter (Signed)
[name]:  Patient called to inform research nurse that she wants to participate in the [place]. She is willing to meet with research nurse Monday 05/03/18 after her other appointments at the [facility] to sign consent form and discuss the details and schedule of this study.  Patient denied any questions at this time for nurse.  Informed patient this research nurse looks forward to meeting her on Monday and thanked her for calling.  Foye Spurling, BSN, RN Clinical Research Nurse 04/30/2018 1:32 PM

## 2018-05-03 ENCOUNTER — Inpatient Hospital Stay: Payer: 59 | Attending: Adult Health

## 2018-05-03 ENCOUNTER — Encounter: Payer: Self-pay | Admitting: Adult Health

## 2018-05-03 ENCOUNTER — Inpatient Hospital Stay (HOSPITAL_BASED_OUTPATIENT_CLINIC_OR_DEPARTMENT_OTHER): Payer: 59 | Admitting: Adult Health

## 2018-05-03 ENCOUNTER — Encounter: Payer: Self-pay | Admitting: *Deleted

## 2018-05-03 ENCOUNTER — Inpatient Hospital Stay: Payer: 59

## 2018-05-03 VITALS — BP 147/85 | HR 85 | Temp 98.1°F

## 2018-05-03 VITALS — BP 140/78 | HR 91 | Temp 98.3°F | Resp 18 | Ht 67.0 in | Wt 267.3 lb

## 2018-05-03 DIAGNOSIS — R0789 Other chest pain: Secondary | ICD-10-CM

## 2018-05-03 DIAGNOSIS — Z5111 Encounter for antineoplastic chemotherapy: Secondary | ICD-10-CM | POA: Insufficient documentation

## 2018-05-03 DIAGNOSIS — Z170 Estrogen receptor positive status [ER+]: Principal | ICD-10-CM

## 2018-05-03 DIAGNOSIS — C50212 Malignant neoplasm of upper-inner quadrant of left female breast: Secondary | ICD-10-CM

## 2018-05-03 DIAGNOSIS — Z79811 Long term (current) use of aromatase inhibitors: Secondary | ICD-10-CM

## 2018-05-03 DIAGNOSIS — Z9012 Acquired absence of left breast and nipple: Secondary | ICD-10-CM | POA: Diagnosis not present

## 2018-05-03 DIAGNOSIS — C773 Secondary and unspecified malignant neoplasm of axilla and upper limb lymph nodes: Secondary | ICD-10-CM

## 2018-05-03 DIAGNOSIS — C50812 Malignant neoplasm of overlapping sites of left female breast: Secondary | ICD-10-CM | POA: Insufficient documentation

## 2018-05-03 DIAGNOSIS — R51 Headache: Secondary | ICD-10-CM

## 2018-05-03 LAB — COMPREHENSIVE METABOLIC PANEL
ALT: 18 U/L (ref 0–44)
AST: 17 U/L (ref 15–41)
Albumin: 3.8 g/dL (ref 3.5–5.0)
Alkaline Phosphatase: 130 U/L — ABNORMAL HIGH (ref 38–126)
Anion gap: 9 (ref 5–15)
BILIRUBIN TOTAL: 0.4 mg/dL (ref 0.3–1.2)
BUN: 14 mg/dL (ref 6–20)
CHLORIDE: 102 mmol/L (ref 98–111)
CO2: 26 mmol/L (ref 22–32)
CREATININE: 0.86 mg/dL (ref 0.44–1.00)
Calcium: 9.9 mg/dL (ref 8.9–10.3)
Glucose, Bld: 107 mg/dL — ABNORMAL HIGH (ref 70–99)
POTASSIUM: 3.7 mmol/L (ref 3.5–5.1)
Sodium: 137 mmol/L (ref 135–145)
TOTAL PROTEIN: 7.8 g/dL (ref 6.5–8.1)

## 2018-05-03 LAB — CBC WITH DIFFERENTIAL/PLATELET
Abs Immature Granulocytes: 0.02 10*3/uL (ref 0.00–0.07)
BASOS PCT: 1 %
Basophils Absolute: 0 10*3/uL (ref 0.0–0.1)
EOS ABS: 0.1 10*3/uL (ref 0.0–0.5)
Eosinophils Relative: 1 %
HEMATOCRIT: 36.6 % (ref 36.0–46.0)
Hemoglobin: 11.6 g/dL — ABNORMAL LOW (ref 12.0–15.0)
Immature Granulocytes: 0 %
LYMPHS ABS: 1.4 10*3/uL (ref 0.7–4.0)
Lymphocytes Relative: 21 %
MCH: 25.8 pg — AB (ref 26.0–34.0)
MCHC: 31.7 g/dL (ref 30.0–36.0)
MCV: 81.5 fL (ref 80.0–100.0)
MONO ABS: 0.7 10*3/uL (ref 0.1–1.0)
MONOS PCT: 10 %
Neutro Abs: 4.6 10*3/uL (ref 1.7–7.7)
Neutrophils Relative %: 67 %
PLATELETS: 297 10*3/uL (ref 150–400)
RBC: 4.49 MIL/uL (ref 3.87–5.11)
RDW: 14.6 % (ref 11.5–15.5)
WBC: 6.8 10*3/uL (ref 4.0–10.5)
nRBC: 0 % (ref 0.0–0.2)

## 2018-05-03 MED ORDER — GOSERELIN ACETATE 3.6 MG ~~LOC~~ IMPL
DRUG_IMPLANT | SUBCUTANEOUS | Status: AC
  Filled 2018-05-03: qty 3.6

## 2018-05-03 MED ORDER — GOSERELIN ACETATE 3.6 MG ~~LOC~~ IMPL
3.6000 mg | DRUG_IMPLANT | Freq: Once | SUBCUTANEOUS | Status: AC
  Administered 2018-05-03: 3.6 mg via SUBCUTANEOUS

## 2018-05-03 NOTE — Research (Addendum)
A PHASE III, MULTICENTER, RANDOMIZED, OPEN-LABEL TRIAL TO EVALUATE EFFICACY ANDSAFETY OF RIBOCICLIB WITH ENDOCRINE THERAPY AS AN ADJUVANT TREATMENT IN PATIENTS WITH HORMONE RECEPTOR POSITIVE, HER2-NEGATIVE, EARLY BREAST CANCER/[name]: Patient was in clinic today by herself today for routine lab and follow up visit with [name], NP, followed by monthly Zoladex injection. Met with patient briefly while she was waiting to see Mendel Ryder.  Introduced myself and patient agreed she wants to sign consent for this study today and agreed to meet with research nurse after her injection appointment.  Met with patient again after her injection in a private exam room.for a consent discussion lasting 45 minutes. The Informed Consent Form version 1.0, IRB approved on Jul 14, 2017 was reviewed in its entirety with thepatient including the purpose, possible risks and benefits of this study. Study procedures and timeline were discussed with the patient. Patient states that she is willing and able to comply with scheduled visits, treatment plans, laboratory tests, and other trial procedures. Patient agreed to avoid consumption of grapefruit, grapefruit hybrids, pummelos, star fruit, [place] oranges, and products containing the juice of each for 7 days prior to date of randomization and during treatment if randomized to the Ribociclib arm of the study.  All of her questions were answered and she agreed to participation in the research study. She understandsthat her signature allows [name] to proceed with screening procedures to confirm that she qualifies for enrollment. Patient signed and dated consent for main study, including for the optional pharmacogenetic samples. Copy of signed consent given to patient for her records.  Reviewed patient's schedule for preferred study start date.  Patient would like to schedule study visits same day as her monthly Zoladex injections. She agreed to start on study with her next injection  appointment on Monday, 05/31/18. Patient understands if she is randomized to the Ribociclib arm, then she would be starting this drug on 05/31/18 and taking it during the holidays. Patient requests other screening/study visits on Fridays if possible since she is usually off work on Fridays. Research nurse plans to schedule screening activities such as fasting labs, ECG, questionnaires and MD visit for Friday 05/28/18 per patient preference (she did not want to be scheduled the previous Friday the 13th).  Informed patient that MD appointment might be on 12/20 or 12/23 depending on which day works best for Dr. Jana Hakim. Research nurse will work out schedule details and then call patient with specific times.  Gave patient my business card and encouraged patient to call or email if any questions or concerns prior to next visit.  Thanked patient for her participation in this study.  Patient entered into study IRT for Screening and assigned PID #[id number].  Dr. Jana Hakim notified of patient's consent and entered into screening phase of this study.  Foye Spurling, BSN, RN Clinical Research Nurse 05/03/2018 3:48 PM

## 2018-05-03 NOTE — Progress Notes (Signed)
[place]  [phone] [phone]     ID: [name] DOB: [birth year]  MR#: [id number]  MBT#:[id number]  Patient Care Team: Willey Blade, MD as PCP - General (Internal Medicine) Magrinat, [name], MD as Consulting Physician (Oncology) Erroll Luna, MD as Consulting Physician (General Surgery) Kyung Rudd, MD as Consulting Physician (Radiation Oncology) Dillingham, Loel Lofty, DO as Attending Physician (Plastic Surgery) OTHER MD:  CHIEF COMPLAINT: Estrogen receptor positive breast cancer  CURRENT TREATMENT: letrozole, goserelin   HISTORY OF CURRENT ILLNESS: From the original intake note:  The patient herself noted some changes in her left breast late July 2018, she says, and eventually brought this to medical attention so that on 01/09/2017 she underwent bilateral diagnostic mammography with tomography and left breast ultrasonography at the breast Center. This found the breast density to be category C. In the upper inner quadrant of the left breast there was an area of asymmetry and there were malignant type calcifications involving all 4 quadrants. On exam there is firmness and palpable thickening in the anterior left breast with skin dimpling. Ultrasonography found at the 9:30 o'clock radiant 3 cm from the nipple a 2.7 cm mass and in the left axilla for abnormal lymph nodes largest of which measured 2.7 cm.  Biopsy of the left breast 9:30 o'clock mass 80 01/26/2017 showed ([id number]) invasive ductal carcinoma, with extracellular mucin, grade 1 or 2. In the lower outer left breast is separated biopsy the same day showed ductal carcinoma in situ. One of the 4 lymph nodes involved was positive for metastatic carcinoma. Prognostic panel on the invasive disease showed it to be estrogen receptor 100% positive, progesterone receptor 20% positive, both with strong staining intensity, with an MIB-1 of 20%, and no HER-2 amplification with a signals  ratio 1.38 and the number per cell 2.90.  On 01/22/2017 the patient underwent bilateral breast MRI. This showed no involvement of the right breast, but in the left breast there was a masslike and non-masslike enhancement involving all quadrants, with skin swelling but no abnormal enhancement of the skin or nipple areolar complex or pectoralis muscle. The mass could not be clearly measured but spanned approximately 12 cm. There was bulky left axillary lymphadenopathy, with the largest lymph node measuring up to 3.4 cm.  The patient's subsequent history is as detailed below.  INTERVAL HISTORY: Myrikal returns today for evaluation and treatment of her estrogen receptor positive breast cancer. She continues on letrozole, with good tolerance. She says she isn't having any notable issues from the Letrozole.  She is walking and notes occasional joint aches/pains.  She also receives goserelin every 4 weeks, with a dose due today.    REVIEW OF SYSTEMS: Safiyah had some urinary frequency last week that has since resolved.  She has intermittent hot flashes.  She went to pelvic rehab for her vaginal dryness, and notes a significant improvement.  Davan has intermittent headaches that are chronic and unchanged.  These are non unusual and accompany no symptoms.  She has some intermittent chest pressure x 1-2 weeks.  She says it comes and goes and isn't associated with any symptoms/activities.  She hasn't tried any prilosec otc, or any other remedies to help it.  Otherwise, Yumiko is dong well.  She isn't having any unusual vision changes/worsening headaches.  She denies any palpitations, cough, shortness of breath or swelling.  She is without nausea, vomiting, bowel/bladder changes, abdominal pain.  A detailed ROS was otherwise non contributory.  PAST MEDICAL HISTORY: Past Medical History:  Diagnosis Date  . Anemia   . Breast cancer ([place]) 01/14/2017   Left breast  . Diabetes mellitus    GDM    PAST  SURGICAL HISTORY: Past Surgical History:  Procedure Laterality Date  . BREAST RECONSTRUCTION WITH PLACEMENT OF TISSUE EXPANDER AND FLEX HD (ACELLULAR HYDRATED DERMIS) Left 08/27/2017   Procedure: LEFT BREAST RECONSTRUCTION WITH PLACEMENT OF TISSUE EXPANDER AND FLEX HD;  Surgeon: Wallace Going, DO;  Location: [place];  Service: Plastics;  Laterality: Left;  . CESAREAN SECTION     x2  . MASTECTOMY MODIFIED RADICAL Left 08/27/2017  . MASTECTOMY MODIFIED RADICAL Left 08/27/2017   Procedure: LEFT MODIFIED RADICAL MASTECTOMY;  Surgeon: Erroll Luna, MD;  Location: [place];  Service: General;  Laterality: Left;  . PORT-A-CATH REMOVAL Right 08/27/2017   Procedure: REMOVAL PORT-A-CATH RIGHT CHEST;  Surgeon: Erroll Luna, MD;  Location: [place];  Service: General;  Laterality: Right;  . PORTACATH PLACEMENT Right 01/28/2017   Procedure: INSERTION PORT-A-CATH WITH ULTRASOUND;  Surgeon: Erroll Luna, MD;  Location: [place];  Service: General;  Laterality: Right;  . TUBAL LIGATION Bilateral 01/21/2011    FAMILY HISTORY Family History  Problem Relation Age of Onset  . Breast cancer Paternal Grandmother 35       d.60s from breast cancer. Did not have treatment.  . Other Mother        I.95 from complications of surgery to remove brain tumor  The patient's father still alive at age 25. The patient's mother died with a brain tumor which the patient says was "benign". She was 56. The patient has one brother, no sisters. The only breast cancer in the family is a paternal grandmother who died from breast cancer at an unknown age. There is no history of ovarian or prostate cancer in the family.  GYNECOLOGIC HISTORY:  No LMP recorded. (Menstrual status: Chemotherapy). Menarche age 66 and first live birth age 54 she is [place] P4. The person is still having regular periods as of August 2018  SOCIAL HISTORY: (As of August 2018) Jaemarie works at [employer] on the fifth floor, [place]. Her  husband, [name], is a Building control surveyor. Daughter is married and lives in [place] and works as a Education administrator. Daughter, [name] lives in [place] and works in the police department. Daughters, Duryea and Wakeman are 67 and 6, living at home. The patient has no grandchildren. She attends a Tour manager   ADVANCED DIRECTIVES: Not in place   HEALTH MAINTENANCE: Social History   Tobacco Use  . Smoking status: Never Smoker  . Smokeless tobacco: Never Used  Substance Use Topics  . Alcohol use: Yes    Comment: social  . Drug use: No     Colonoscopy: Never  PAP: December 2017  Bone density: Never   No Known Allergies  Current Outpatient Medications  Medication Sig Dispense Refill  . acetaminophen (TYLENOL) 500 MG tablet Take 1,000 mg by mouth every 6 (six) hours as needed (for pain/headaches.).    [place] Kitchen B Complex Vitamins (VITAMIN B COMPLEX PO) Take 1 capsule by mouth 2 (two) times daily.     . benzonatate (TESSALON) 100 MG capsule Take 100 mg by mouth every 8 (eight) hours as needed for cough.   0  . Calcium Carb-Cholecalciferol (CALCIUM 600+D3 PO) Take 1 tablet by mouth 2 (two) times daily.    . cetirizine (ZYRTEC) 10 MG tablet Take 10 mg by mouth daily.      Marland Kitchen  Cholecalciferol (VITAMIN D-3) 5000 units TABS Take 5,000 Units by mouth 2 (two) times daily.    Marland Kitchen gabapentin (NEURONTIN) 300 MG capsule Take 1 capsule (300 mg total) by mouth at bedtime. 90 capsule 4  . ibuprofen (ADVIL,MOTRIN) 200 MG tablet Take 800 mg by mouth every 8 (eight) hours as needed (for pain.).    Marland Kitchen letrozole (FEMARA) 2.5 MG tablet Take 1 tablet (2.5 mg total) by mouth daily. 30 tablet 5  . loratadine (CLARITIN) 10 MG tablet Take 1 tablet (10 mg total) by mouth daily. 90 tablet 2  . montelukast (SINGULAIR) 10 MG tablet Take 10 mg by mouth daily as needed (for allergies.).     Marland Kitchen Multiple Vitamin (MULTIVITAMIN WITH MINERALS) TABS tablet Take 1 tablet by mouth daily.    . Omega-3 Fatty Acids (FISH OIL) 1000 MG  CAPS Take 2,000 mg by mouth daily.     Marland Kitchen PREVIDENT 5000 BOOSTER PLUS 1.1 % PSTE BRUSH ONCE A DAY IN PLACE OF REGULAR TOOTHPASTE  2  . Probiotic Product (PROBIOTIC DAILY PO) Take by mouth 2 (two) times daily.    Marland Kitchen senna (SENOKOT) 8.6 MG TABS tablet Take 1 tablet (8.6 mg total) by mouth 2 (two) times daily. (Patient taking differently: Take 1 tablet by mouth 2 (two) times daily. Patient takes prn) 120 each 0   No current facility-administered medications for this visit.    Facility-Administered Medications Ordered in Other Visits  Medication Dose Route Frequency Provider Last Rate Last Dose  . sodium chloride flush (NS) 0.9 % injection 10 mL  10 mL Intravenous PRN Magrinat, [name], MD   10 mL at 03/10/17 1239    OBJECTIVE:  Vitals:   05/03/18 1326  BP: 140/78  Pulse: 91  Resp: 18  Temp: 98.3 F (36.8 C)  SpO2: 98%     Body mass index is 41.87 kg/m.   Wt Readings from Last 3 Encounters:  05/03/18 267 lb 4.8 oz (121.2 kg)  03/08/18 271 lb 8 oz (123.2 kg)  03/01/18 273 lb 6.4 oz (124 kg)   ECOG FS: 1 - Symptomatic but completely ambulatory GENERAL: Patient is a well appearing female in no acute distress HEENT:  Sclerae anicteric.  Oropharynx clear and moist. No ulcerations or evidence of oropharyngeal candidiasis. Neck is supple.  NODES:  No cervical, supraclavicular, or axillary lymphadenopathy palpated.  BREAST EXAM:  Left breast s/p mastectomy, reconstruction and radiation, no sign of local recurrence, right breast benign LUNGS:  Clear to auscultation bilaterally.  No wheezes or rhonchi. HEART:  Regular rate and rhythm. No murmur appreciated. ABDOMEN:  Soft, nontender.  Positive, normoactive bowel sounds. No organomegaly palpated. MSK:  No focal spinal tenderness to palpation. Full range of motion bilaterally in the upper extremities. EXTREMITIES:  No peripheral edema.   SKIN:  Clear with no obvious rashes or skin changes. No nail dyscrasia. NEURO:  Nonfocal. Well oriented.   Appropriate affect.      LAB RESULTS:    Appointment on 05/03/2018  Component Date Value Ref Range Status  . WBC 05/03/2018 6.8  4.0 - 10.5 K/uL Final  . RBC 05/03/2018 4.49  3.87 - 5.11 MIL/uL Final  . Hemoglobin 05/03/2018 11.6* 12.0 - 15.0 g/dL Final  . HCT 05/03/2018 36.6  36.0 - 46.0 % Final  . MCV 05/03/2018 81.5  80.0 - 100.0 fL Final  . MCH 05/03/2018 25.8* 26.0 - 34.0 pg Final  . MCHC 05/03/2018 31.7  30.0 - 36.0 g/dL Final  . RDW 05/03/2018 14.6  11.5 - 15.5 % Final  . Platelets 05/03/2018 297  150 - 400 K/uL Final  . nRBC 05/03/2018 0.0  0.0 - 0.2 % Final  . Neutrophils Relative % 05/03/2018 67  % Final  . Neutro Abs 05/03/2018 4.6  1.7 - 7.7 K/uL Final  . Lymphocytes Relative 05/03/2018 21  % Final  . Lymphs Abs 05/03/2018 1.4  0.7 - 4.0 K/uL Final  . Monocytes Relative 05/03/2018 10  % Final  . Monocytes Absolute 05/03/2018 0.7  0.1 - 1.0 K/uL Final  . Eosinophils Relative 05/03/2018 1  % Final  . Eosinophils Absolute 05/03/2018 0.1  0.0 - 0.5 K/uL Final  . Basophils Relative 05/03/2018 1  % Final  . Basophils Absolute 05/03/2018 0.0  0.0 - 0.1 K/uL Final  . Immature Granulocytes 05/03/2018 0  % Final  . Abs Immature Granulocytes 05/03/2018 0.02  0.00 - 0.07 K/uL Final   Performed at [facility], [place] [address], [place], [place] [address]       STUDIES: Scheduled for mammography and bone density on 03/16/2018 at [place].  ELIGIBLE FOR AVAILABLE RESEARCH PROTOCOL: [name], ASA study  ASSESSMENT: 47 y.o. [place] woman status post left breast upper inner quadrant biopsy 01/14/2017 for a clinical T3 N2, stage IIA invasive ductal carcinoma, grade 1 or 2, estrogen and progesterone receptor positive, HER-2 nonamplified, with an MIB-1 of 20%.  (1) staging studies: Brain MRI, bone scan, and CT scan of the chest 02/05/2017 showed no brain lesions, no lung or liver lesions, a 4.9 cm mass in the left breast with left axillary and  subpectoral adenopathy, and nonspecific bone scan tracer at L2, left scapula, and anterior ribs, with lumbar spine MRI suggested for further evaluation.  (a) lumbar spine MRI 02/17/2017 showed no normal bone lesions.  There was mild lumbar spondylosis  (2) neoadjuvant chemotherapy consisting of cyclophosphamide and doxorubicin in dose dense fashion 4 started 02/10/2017, completed 03/24/2017, followed by weekly carboplatin and gemcitabine given days 1 and 8 of each 21-day cycle starting 04/14/2017, completing the planned 4 cycles 06/26/2017  (3) is post left modified radical mastectomy on 08/27/2017 showing an mpT3 pN2 residual invasive ductal carcinoma, grade 2, with a residual cancer burden of 3.  Margins were clear  (a) TRAM flap reconstruction planned  (4) postmastectomy radiation completed 01/01/2018  (a) capecitabine radiosensitization 11/16/2017-01/01/2018  (5) goserelin started 09/21/2017  (a) letrozole started 01/11/2018  (6) genetics testing 03/24/2017 through the Common Hereditary Cancer Panel offered by [facility] found no deleterious mutations in APC, ATM, AXIN2, BARD1, BMPR1A, BRCA1, BRCA2, BRIP1, CDH1, CDKN2A (p14ARF), CDKN2A (p16INK4a), CHEK2, CTNNA1, DICER1, EPCAM (Deletion/duplication testing only), GREM1 (promoter region deletion/duplication testing only), KIT, MEN1, MLH1, MSH2, MSH3, MSH6, MUTYH, NBN, NF1, NHTL1, PALB2, PDGFRA, PMS2, POLD1, POLE, PTEN, RAD50, RAD51C, RAD51D, SDHB, SDHC, SDHD, SMAD4, SMARCA4. STK11, TP53, TSC1, TSC2, and VHL.  The following genes were evaluated for sequence changes only: SDHA and HOXB13 c.251G>A variant only.    PLAN: Mariela is doing well today.  She has no clinical sign of recurrence.  Her labs are stable and I reviewed those with her in detail.  She is tolerating the Goserelin and Letrozole well and will continue this.    We reviewed her chest pressure.  This could be a couple of different things.  Due to her h/o receiving Doxorubicin, and her co  morbidities, I got an EKG that showed normal sinus rhythm today, and we will repeat an echocardiogram.  She will also take Prilosec  OTC to see if that may help.  Harrietta and I reviewed healthy diet, exercise, and staying up to date with her health maintenance.  She verbalizes understanding of this.  Shilo is being screened for the Lifescape clinical trial, and her f/u is dependent on her participation in that trial.  Cameo is her research nurse and she will arrange for Matricia's follow up.  Wyatt knows to call for any questions or concerns prior to her next appointment with [name].    A total of (30) minutes of face-to-face time was spent with this patient with greater than 50% of that time in counseling and care-coordination.   [name], NP  05/03/18 1:31 PM Medical Oncology and Hematology [facility] [address] [place], [place] [address] Tel. [phone]    Fax. [phone]

## 2018-05-04 ENCOUNTER — Other Ambulatory Visit: Payer: Self-pay | Admitting: *Deleted

## 2018-05-04 ENCOUNTER — Encounter: Payer: Self-pay | Admitting: Adult Health

## 2018-05-04 ENCOUNTER — Ambulatory Visit: Payer: 59 | Admitting: Plastic Surgery

## 2018-05-04 ENCOUNTER — Encounter: Payer: Self-pay | Admitting: Plastic Surgery

## 2018-05-04 ENCOUNTER — Ambulatory Visit: Payer: 59 | Admitting: Physical Therapy

## 2018-05-04 ENCOUNTER — Telehealth: Payer: Self-pay | Admitting: Adult Health

## 2018-05-04 VITALS — BP 132/82 | Ht 65.0 in | Wt 233.0 lb

## 2018-05-04 DIAGNOSIS — Z170 Estrogen receptor positive status [ER+]: Secondary | ICD-10-CM

## 2018-05-04 DIAGNOSIS — Z9012 Acquired absence of left breast and nipple: Secondary | ICD-10-CM | POA: Diagnosis not present

## 2018-05-04 DIAGNOSIS — C50812 Malignant neoplasm of overlapping sites of left female breast: Secondary | ICD-10-CM

## 2018-05-04 DIAGNOSIS — C50212 Malignant neoplasm of upper-inner quadrant of left female breast: Secondary | ICD-10-CM | POA: Diagnosis not present

## 2018-05-04 NOTE — Telephone Encounter (Signed)
Per 11/25 no los 

## 2018-05-04 NOTE — Progress Notes (Signed)
Patient ID: Mackenzie [name], female    DOB: 08/15/1970, 47 y.o.   MRN: [id number]   Chief Complaint  Patient presents with  . Breast Problem    Patient is a 47 year old bf here for evaluation of her left breast reconstruction.  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  She completed chemotherapy and had a very good response Jan 2019.  She did have genetic testing and it was negative.  She is 5 feet 7 inches tall, weight is 255 pounds.  Preop bra 42 C.  She underwent a left mastectomy with placement of an expander and Flex HD.  She then underwent radiation which ended in July 2019.  She would like reconstruction and is aware she will most likely need a latissimus muscle flap or a muscle flap of some sort and she is interested in a latissimus flap.  She is also planning on switching jobs and therefore is thinking about undergoing the second stage the beginning of 2020.  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  She has darkening of the skin of the left breast in the area of the radiation.  This does appear much better than her last exam when it was very red and raw.  The skin is tight.  The incision is well-healed.  She has ptosis grade 3 of the right breast.  She also shared some sad news that her husband is leaving her for another woman.  She does have some family support and her kids.   Review of Systems  Constitutional: Negative.  Negative for activity change and appetite change.  HENT: Negative.   Eyes: Negative.   Respiratory: Negative.   Cardiovascular: Negative.  Negative for chest pain.  Gastrointestinal: Negative.  Negative for abdominal distention.  Endocrine: Negative.   Genitourinary: Negative.   Musculoskeletal: Negative.   Neurological: Negative.   Hematological: Negative.   Psychiatric/Behavioral: Negative.     Past Medical History:  Diagnosis Date  . Anemia   . Breast  cancer ([facility]) 01/14/2017   Left breast  . Diabetes mellitus    GDM    Past Surgical History:  Procedure Laterality Date  . BREAST RECONSTRUCTION WITH PLACEMENT OF TISSUE EXPANDER AND FLEX HD (ACELLULAR HYDRATED DERMIS) Left 08/27/2017   Procedure: LEFT BREAST RECONSTRUCTION WITH PLACEMENT OF TISSUE EXPANDER AND FLEX HD;  Surgeon: Wallace Going, DO;  Location: [facility];  Service: Plastics;  Laterality: Left;  . CESAREAN SECTION     x2  . MASTECTOMY MODIFIED RADICAL Left 08/27/2017  . MASTECTOMY MODIFIED RADICAL Left 08/27/2017   Procedure: LEFT MODIFIED RADICAL MASTECTOMY;  Surgeon: Erroll Luna, MD;  Location: [facility];  Service: General;  Laterality: Left;  . PORT-A-CATH REMOVAL Right 08/27/2017   Procedure: REMOVAL PORT-A-CATH RIGHT CHEST;  Surgeon: Erroll Luna, MD;  Location: [facility];  Service: General;  Laterality: Right;  . PORTACATH PLACEMENT Right 01/28/2017   Procedure: INSERTION PORT-A-CATH WITH ULTRASOUND;  Surgeon: Erroll Luna, MD;  Location: [facility];  Service: General;  Laterality: Right;  . TUBAL LIGATION Bilateral 01/21/2011      Current Outpatient Medications:  .  acetaminophen (TYLENOL) 500 MG tablet, Take 1,000 mg by mouth every 6 (six) hours as needed (for pain/headaches.)., Disp: , Rfl:  .  B Complex Vitamins (VITAMIN B COMPLEX PO), Take 1 capsule by mouth 2 (two) times daily. , Disp: , Rfl:  .  benzonatate (  TESSALON) 100 MG capsule, Take 100 mg by mouth every 8 (eight) hours as needed for cough. , Disp: , Rfl: 0 .  Calcium Carb-Cholecalciferol (CALCIUM 600+D3 PO), Take 1 tablet by mouth 2 (two) times daily., Disp: , Rfl:  .  cetirizine (ZYRTEC) 10 MG tablet, Take 10 mg by mouth daily.  , Disp: , Rfl:  .  Cholecalciferol (VITAMIN D-3) 5000 units TABS, Take 5,000 Units by mouth 2 (two) times daily., Disp: , Rfl:  .  gabapentin (NEURONTIN) 300 MG capsule, Take 1 capsule (300 mg total) by mouth at bedtime., Disp: 90 capsule, Rfl: 4 .  ibuprofen  (ADVIL,MOTRIN) 200 MG tablet, Take 800 mg by mouth every 8 (eight) hours as needed (for pain.)., Disp: , Rfl:  .  letrozole (FEMARA) 2.5 MG tablet, Take 1 tablet (2.5 mg total) by mouth daily., Disp: 30 tablet, Rfl: 5 .  loratadine (CLARITIN) 10 MG tablet, Take 1 tablet (10 mg total) by mouth daily., Disp: 90 tablet, Rfl: 2 .  montelukast (SINGULAIR) 10 MG tablet, Take 10 mg by mouth daily as needed (for allergies.). , Disp: , Rfl:  .  Multiple Vitamin (MULTIVITAMIN WITH MINERALS) TABS tablet, Take 1 tablet by mouth daily., Disp: , Rfl:  .  Omega-3 Fatty Acids (FISH OIL) 1000 MG CAPS, Take 2,000 mg by mouth daily. , Disp: , Rfl:  .  PREVIDENT 5000 BOOSTER PLUS 1.1 % PSTE, BRUSH ONCE A DAY IN PLACE OF REGULAR TOOTHPASTE, Disp: , Rfl: 2 .  Probiotic Product (PROBIOTIC DAILY PO), Take by mouth 2 (two) times daily., Disp: , Rfl:  .  senna (SENOKOT) 8.6 MG TABS tablet, Take 1 tablet (8.6 mg total) by mouth 2 (two) times daily. (Patient taking differently: Take 1 tablet by mouth 2 (two) times daily. Patient takes prn), Disp: 120 each, Rfl: 0 No current facility-administered medications for this visit.   Facility-Administered Medications Ordered in Other Visits:  .  sodium chloride flush (NS) 0.9 % injection 10 mL, 10 mL, Intravenous, PRN, Magrinat, [name], MD, 10 mL at 03/10/17 1239   Objective:   Vitals:   05/04/18 1002  BP: 132/82  SpO2: 98%    Physical Exam  Constitutional: She appears well-developed and well-nourished.  HENT:  Head: Normocephalic and atraumatic.  Eyes: Pupils are equal, round, and reactive to light. EOM are normal.  Cardiovascular: Normal rate.  Pulmonary/Chest: Effort normal.  Abdominal: Soft. She exhibits no distension. There is no tenderness.  Neurological: She is alert.  Skin: Skin is warm.  Psychiatric: She has a normal mood and affect. Her behavior is normal. Judgment and thought content normal.    Assessment & Plan:  Malignant neoplasm of overlapping sites  of left breast in female, estrogen receptor positive ([place])  Morbid obesity with body mass index (BMI) of 40.0 to 49.9 ([facility])  Malignant neoplasm of upper-inner quadrant of left breast in female, estrogen receptor positive ([place])  Acquired absence of left breast  Assessment and Plan:  A long, detailed conversation was had regarding the patient's options for breast reconstruction. Five main points, which are explained to all breast reconstruction patients, were discussed.  1. Breast reconstruction is an optional process.  2. Breast reconstruction is a multi-stage process which involves multiple surgeries spaced several months apart. The entire process can take over one year.  3. The major goal of breast reconstruction is to have the patient look normal in clothing. When naked, there will always be scars.  4. Asymmetries are often present during the reconstruction  process. Several operations may be needed, including surgery to the non-cancerous breast, to achieve satisfactory results.  5. No matter the reconstructive method, there are ways that the reconstruction can fail and a secondary reconstructive plan would need to be created.   A general discussion regarding all available methods of breast reconstruction were discussed. The types of reconstructions described included.  1. Tissue expander and implant based reconstruction, both single and multi-stage approaches.  2. Autologous only reconstructions, including free abdominal-tissue based reconstructions.  3. Combination procedures, particularly latissismus dorsi flaps combined with either expanders or implants.  For each of the reconstruction methods mentioned above, the risks, benefits, alternatives, scarring, and recovery time were discussed in great detail. Specific risks detailed included bleeding, infection, hematoma, seroma, scarring, pain, wound healing complications, flap loss, fat necrosis, capsular contracture, need for implant removal,  donor site complications, bulge, hernia, umbilical necrosis, need for urgent reoperation, and need for dressing changes were discussed.   Assessment  Once all reconstruction options were presented, a focused discussion was had regarding the patient's suitability for each of these procedures.  A total of 50 minutes of face-to-face time was spent in this encounter, of which >50% was spent in counseling.  The patient will come and see me the beginning of the new year and we will plan on a latissimus muscle flap with continued expansion for left breast reconstruction.  In the meantime I encouraged her to increase her protein and decrease her sugar and carbohydrate intake.

## 2018-05-05 ENCOUNTER — Telehealth: Payer: Self-pay | Admitting: *Deleted

## 2018-05-05 NOTE — Telephone Encounter (Signed)
Called patient to follow up on schedule for the [place].  LVM requesting patient call research nurse back when she is able to discuss schedule.  Screening ECG, Labs and questionnaires scheduled for Friday 12/20 as discussed but this nurse discovered that the screening ECG needs to be done at least 3 business days prior to planned randomization date (Monday 12/23) to make sure there is adequate time for it to be approved by the study before randomization.  Asked patient if she can come in for these study activities 12/17 or 12/18 instead of 12/20.   Foye Spurling, BSN, RN Clinical Research Nurse 05/05/2018 11:00 AM

## 2018-05-10 ENCOUNTER — Telehealth: Payer: Self-pay | Admitting: *Deleted

## 2018-05-10 ENCOUNTER — Other Ambulatory Visit (HOSPITAL_COMMUNITY): Payer: 59

## 2018-05-10 NOTE — Telephone Encounter (Signed)
Called patient to follow up on schedule for the [place].  LVM requesting patient call research nurse back when she is able to discuss schedule.  Screening ECG, Labs and questionnaires scheduled for Friday 12/20 but to be done at least 3 business days prior to planned randomization date (Monday 12/23) to make sure there is adequate time for ECG to be approved by the study before randomization.  Asked patient if she can come in for these study activities 12/10, 12/11, 12/12, 12/13, 12/17 or 12/18 instead of 12/20.   Left my phone number and requested patient call back so we can reschedule.  Foye Spurling, BSN, RN Clinical Research Nurse 05/10/2018 4:09 PM

## 2018-05-11 ENCOUNTER — Ambulatory Visit (HOSPITAL_COMMUNITY)
Admission: RE | Admit: 2018-05-11 | Discharge: 2018-05-11 | Disposition: A | Discharge status: Home / Self Care | Payer: 59 | Source: Ambulatory Visit | Attending: Adult Health | Admitting: Adult Health

## 2018-05-11 ENCOUNTER — Telehealth: Payer: Self-pay

## 2018-05-11 DIAGNOSIS — Z170 Estrogen receptor positive status [ER+]: Secondary | ICD-10-CM | POA: Diagnosis not present

## 2018-05-11 DIAGNOSIS — R0789 Other chest pain: Secondary | ICD-10-CM | POA: Insufficient documentation

## 2018-05-11 DIAGNOSIS — R931 Abnormal findings on diagnostic imaging of heart and coronary circulation: Secondary | ICD-10-CM | POA: Insufficient documentation

## 2018-05-11 DIAGNOSIS — E785 Hyperlipidemia, unspecified: Secondary | ICD-10-CM | POA: Diagnosis not present

## 2018-05-11 DIAGNOSIS — C50812 Malignant neoplasm of overlapping sites of left female breast: Secondary | ICD-10-CM | POA: Insufficient documentation

## 2018-05-11 DIAGNOSIS — R7309 Other abnormal glucose: Secondary | ICD-10-CM | POA: Diagnosis not present

## 2018-05-11 DIAGNOSIS — C50919 Malignant neoplasm of unspecified site of unspecified female breast: Secondary | ICD-10-CM | POA: Diagnosis not present

## 2018-05-11 DIAGNOSIS — E669 Obesity, unspecified: Secondary | ICD-10-CM | POA: Diagnosis not present

## 2018-05-11 DIAGNOSIS — R05 Cough: Secondary | ICD-10-CM | POA: Diagnosis not present

## 2018-05-11 NOTE — Telephone Encounter (Signed)
-----   Message from Gardenia Phlegm, NP sent at 05/11/2018  2:15 PM EST ----- Please let patient know that her echo was normal ----- Message ----- From: Interface, [employer] Sent: 05/11/2018   1:43 PM EST To: Gardenia Phlegm, NP

## 2018-05-11 NOTE — Telephone Encounter (Signed)
Spoke with patient to inform of normal echo.  Patient voiced understanding.  No questions/concerns at this time.

## 2018-05-12 ENCOUNTER — Telehealth: Payer: Self-pay | Admitting: *Deleted

## 2018-05-12 ENCOUNTER — Ambulatory Visit: Payer: 59 | Attending: Oncology | Admitting: Physical Therapy

## 2018-05-12 ENCOUNTER — Other Ambulatory Visit (HOSPITAL_COMMUNITY): Payer: 59

## 2018-05-12 DIAGNOSIS — R252 Cramp and spasm: Secondary | ICD-10-CM | POA: Diagnosis not present

## 2018-05-12 DIAGNOSIS — R278 Other lack of coordination: Secondary | ICD-10-CM | POA: Diagnosis not present

## 2018-05-12 DIAGNOSIS — M6281 Muscle weakness (generalized): Secondary | ICD-10-CM | POA: Insufficient documentation

## 2018-05-12 NOTE — Therapy (Signed)
[facility] [address] [place], [place], [address] Phone: [phone]   Fax:  [phone]  Physical Therapy Treatment  Patient Details  Name: Mackenzie [name] MRN: [id number] Date of Birth: 24-May-1971 Referring Provider (PT): Magrinat, [name], MD   Encounter Date: 05/12/2018  PT End of Session - 05/12/18 1451    Visit Number  5    Date for PT Re-Evaluation  05/13/18    PT Start Time  1448    PT Stop Time  1528    PT Time Calculation (min)  40 min    Activity Tolerance  Patient tolerated treatment well       Past Medical History:  Diagnosis Date  . Anemia   . Breast cancer ([place]) 01/14/2017   Left breast  . Diabetes mellitus    GDM    Past Surgical History:  Procedure Laterality Date  . BREAST RECONSTRUCTION WITH PLACEMENT OF TISSUE EXPANDER AND FLEX HD (ACELLULAR HYDRATED DERMIS) Left 08/27/2017   Procedure: LEFT BREAST RECONSTRUCTION WITH PLACEMENT OF TISSUE EXPANDER AND FLEX HD;  Surgeon: Wallace Going, DO;  Location: [facility];  Service: Plastics;  Laterality: Left;  . CESAREAN SECTION     x2  . MASTECTOMY MODIFIED RADICAL Left 08/27/2017  . MASTECTOMY MODIFIED RADICAL Left 08/27/2017   Procedure: LEFT MODIFIED RADICAL MASTECTOMY;  Surgeon: Erroll Luna, MD;  Location: [facility];  Service: General;  Laterality: Left;  . PORT-A-CATH REMOVAL Right 08/27/2017   Procedure: REMOVAL PORT-A-CATH RIGHT CHEST;  Surgeon: Erroll Luna, MD;  Location: [facility];  Service: General;  Laterality: Right;  . PORTACATH PLACEMENT Right 01/28/2017   Procedure: INSERTION PORT-A-CATH WITH ULTRASOUND;  Surgeon: Erroll Luna, MD;  Location: [facility];  Service: General;  Laterality: Right;  . TUBAL LIGATION Bilateral 01/21/2011    There were no vitals filed for this visit.  Subjective Assessment - 05/12/18 1451    Subjective  I am tired today.    Currently in Pain?  No/denies                        [facility] Adult PT Treatment/Exercise - 05/12/18 0001      Exercises   Exercises  Lumbar      Lumbar Exercises: Stretches   Active Hamstring Stretch  Right;Left;2 reps;20 seconds    Figure 4 Stretch  2 reps;20 seconds;With overpressure;Seated    Gastroc Stretch  Right;Left;3 reps;20 seconds      Lumbar Exercises: Aerobic   Elliptical  L3 ; 68mn fwd/ 2 min back   cues to engage core and pelvic floor   Nustep  L1 x 8 min   engage pelvic floor and TrA     Lumbar Exercises: Standing   Row  Strengthening;Both;20 reps;Theraband    Theraband Level (Row)  Level 3 (Green)    Shoulder Extension  Strengthening;Both;20 reps;Theraband    Theraband Level (Shoulder Extension)  Level 2 (Red)    Other Standing Lumbar Exercises  hip abd, ext - 2 x 10   add 2#   Other Standing Lumbar Exercises  UE flexion wth TrA; 3 lb x 20 reps      Lumbar Exercises: Supine   Dead Bug  20 reps;2 seconds   2 lb weight in UE; small march with LE            PT Education - 05/12/18 1538    Education Details   Access Code: [email or web address]  Person(s) Educated  Patient    Methods  Explanation;Demonstration;Handout;Verbal cues    Comprehension  Returned demonstration;Verbalized understanding          PT Long Term Goals - 05/12/18 1522      PT LONG TERM GOAL #1   Title  Pt. will be independent in HEP    Status  Achieved      PT LONG TERM GOAL #2   Title  pt will report 50% less leakage due to increased strength and endurance of pelvic floor    Baseline  80% less leakage    Status  Achieved      PT LONG TERM GOAL #3   Title  pt will be able to attempt intercourse due to improved soft tissue pliabiliity and health    Status  Deferred      PT LONG TERM GOAL #4   Title  Pt will report 50% reduction in symptoms of pelvic discomfort    Baseline  70% less discomfort    Status  Achieved            Plan - 05/12/18 1521    Clinical Impression Statement  Pt is doing  well and has met her long term goals.  One goal deferred due to marital status changes and she is not currently sexually active.  She is recommended to discharge with HEP at this time.      PT Treatment/Interventions  ADLs/Self Care Home Management;Biofeedback;Cryotherapy;Electrical Stimulation;Moist Heat;Therapeutic exercise;Therapeutic activities;Neuromuscular re-education;Patient/family education;Manual techniques;Passive range of motion;Dry needling;Taping    PT Next Visit Plan  discharged today    PT Home Exercise Plan  Access Code: [email or web address]     Consulted and Agree with Plan of Care  Patient       Patient will benefit from skilled therapeutic intervention in order to improve the following deficits and impairments:  Pain, Increased fascial restricitons, Impaired tone, Increased muscle spasms, Decreased strength, Decreased range of motion, Impaired flexibility  Visit Diagnosis: Other lack of coordination  Cramp and spasm  Muscle weakness (generalized)     Problem List Patient Active Problem List   Diagnosis Date Noted  . Morbid obesity with body mass index (BMI) of 40.0 to 49.9 ([place]) 09/17/2017  . 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 Malignant neoplasm of upper-inner quadrant of left breast in female, estrogen receptor positive ([place]) 07/27/2017  . Genetic testing 03/25/2017  . Malignant 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, estrogen receptor positive ([place]) 01/23/2017  . 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 [place], [place], [address] Phone: [phone]   Fax:  [phone]  Name: Mackenzie [name] MRN: [id number] Date of Birth: 05-28-1971  PHYSICAL THERAPY DISCHARGE SUMMARY  Visits from Start of Care: 5  Current functional level related to goals / functional outcomes: See  above goals   Remaining deficits: See above   Education / Equipment: HEP  Plan: Patient agrees to discharge.  Patient goals were met. Patient is being discharged due to meeting the stated rehab goals.  ?????     Google, PT 05/12/18 3:45 PM

## 2018-05-12 NOTE — Patient Instructions (Signed)
Access Code: [email or web address]  URL: [email or web address]  Date: 05/12/2018  Prepared by: Lovett Calender   Exercises  Seated Hamstring Stretch - 3 reps - 1 sets - 30 sec hold - 1x daily - 7x weekly  Supine Figure 4 Piriformis Stretch - 3 reps - 1 sets - 30 sec hold - 1x daily - 7x weekly  Supine Pelvic Floor Contraction - 5 reps - 1 sets - 10 sec hold - 3x daily - 7x weekly  Clamshell - 10 reps - 2 sets - 1x daily - 7x weekly  Hooklying clam with kegel - 10 reps - 2 sets - 1x daily - 7x weekly  Supine Bridge with Pelvic Floor Contraction - 10 reps - 2 sets - 1x daily - 7x weekly  Standing Hip Abduction - 10 reps - 2 sets - 1x daily - 7x weekly  Standing Hip Extension - 10 reps - 2 sets - 1x daily - 7x weekly  Hip Flexor Stretch on Step - 3 reps - 1 sets - 30 sec hold - 1x daily - 7x weekly  Sit to Stand with Pelvic Floor Contraction - 10 reps - 1 sets - 3x daily - 7x weekly  Seated Pelvic Floor Contraction with Isometric Hip Adduction - 10 reps - 3 sets - 1x daily - 7x weekly  Hooklying Small March - 10 reps - 2 sets - 1x daily - 7x weekly  Standing Shoulder Row with Anchored Resistance - 10 reps - 3 sets - 1x daily - 7x weekly  Standing Shoulder Extension with Resistance - 10 reps - 3 sets - 1x daily - 7x weekly

## 2018-05-12 NOTE — Telephone Encounter (Signed)
Patient returned call to reschedule her screening lab and ECG appointments as requested.  She can come in next week on Tuesday 12/10 at 9 am.  Reminded patient lab requires fasting for at least 8 hrs.  Also informed patient there is a new consent form to sign and we will also be performing a screening ECG.  Patient verbalized understanding.  Scheduling request sent for lab at 9 am on 12/10.  Patient reports her doctor has prescribed Symbicort for sinus congestion and she asks if this is ok to take on study.  Informed patient I will investigate but she can take it now for her congestion and I will let her know if it is not allowed and when she would need to stop it.  She verbalized understanding.  Foye Spurling, BSN, RN Clinical Research Nurse 05/12/2018 4:47 PM

## 2018-05-13 MED FILL — SYMBICORT 80-4.5 MCG INH: 80-4.5 | 30 days supply | Qty: 10 | Fill #0

## 2018-05-17 ENCOUNTER — Telehealth: Payer: Self-pay | Admitting: *Deleted

## 2018-05-17 NOTE — Telephone Encounter (Signed)
LVM for patient reminding her of study visit tomorrow for screening labs and EKG.  Reminded patient to fast for at least 8 hrs prior to lab appointment. Informed patient there is a new consent form she will also be asked to sign tomorrow.  Asked patient to call research nurse back if any questions or problems. Otherwise I will meet her in the morning at 9 am.  Foye Spurling, BSN, RN Clinical Research Nurse 05/17/2018 12:53 PM

## 2018-05-18 ENCOUNTER — Other Ambulatory Visit: Payer: Self-pay | Admitting: *Deleted

## 2018-05-18 ENCOUNTER — Encounter: Payer: Self-pay | Admitting: *Deleted

## 2018-05-18 ENCOUNTER — Inpatient Hospital Stay: Payer: 59 | Attending: Adult Health

## 2018-05-18 DIAGNOSIS — Z170 Estrogen receptor positive status [ER+]: Principal | ICD-10-CM

## 2018-05-18 DIAGNOSIS — C50812 Malignant neoplasm of overlapping sites of left female breast: Secondary | ICD-10-CM | POA: Insufficient documentation

## 2018-05-18 DIAGNOSIS — Z79811 Long term (current) use of aromatase inhibitors: Secondary | ICD-10-CM | POA: Insufficient documentation

## 2018-05-18 DIAGNOSIS — Z5111 Encounter for antineoplastic chemotherapy: Secondary | ICD-10-CM | POA: Insufficient documentation

## 2018-05-18 DIAGNOSIS — C50212 Malignant neoplasm of upper-inner quadrant of left female breast: Secondary | ICD-10-CM

## 2018-05-18 DIAGNOSIS — Z9012 Acquired absence of left breast and nipple: Secondary | ICD-10-CM | POA: Insufficient documentation

## 2018-05-18 DIAGNOSIS — Z923 Personal history of irradiation: Secondary | ICD-10-CM | POA: Insufficient documentation

## 2018-05-18 DIAGNOSIS — Z9221 Personal history of antineoplastic chemotherapy: Secondary | ICD-10-CM | POA: Insufficient documentation

## 2018-05-18 LAB — CBC WITH DIFFERENTIAL (CANCER CENTER ONLY)
Abs Immature Granulocytes: 0.02 10*3/uL (ref 0.00–0.07)
Basophils Absolute: 0 10*3/uL (ref 0.0–0.1)
Basophils Relative: 0 %
Eosinophils Absolute: 0.1 10*3/uL (ref 0.0–0.5)
Eosinophils Relative: 1 %
HEMATOCRIT: 36.4 % (ref 36.0–46.0)
HEMOGLOBIN: 11.6 g/dL — AB (ref 12.0–15.0)
Immature Granulocytes: 0 %
Lymphocytes Relative: 11 %
Lymphs Abs: 0.9 10*3/uL (ref 0.7–4.0)
MCH: 25.6 pg — ABNORMAL LOW (ref 26.0–34.0)
MCHC: 31.9 g/dL (ref 30.0–36.0)
MCV: 80.4 fL (ref 80.0–100.0)
MONO ABS: 0.7 10*3/uL (ref 0.1–1.0)
MONOS PCT: 9 %
NEUTROS PCT: 79 %
Neutro Abs: 6.1 10*3/uL (ref 1.7–7.7)
Platelet Count: 312 10*3/uL (ref 150–400)
RBC: 4.53 MIL/uL (ref 3.87–5.11)
RDW: 14.7 % (ref 11.5–15.5)
WBC Count: 7.7 10*3/uL (ref 4.0–10.5)
nRBC: 0 % (ref 0.0–0.2)

## 2018-05-18 LAB — CMP (CANCER CENTER ONLY)
ALT: 22 U/L (ref 0–44)
AST: 18 U/L (ref 15–41)
Albumin: 3.8 g/dL (ref 3.5–5.0)
Alkaline Phosphatase: 122 U/L (ref 38–126)
Anion gap: 9 (ref 5–15)
BILIRUBIN TOTAL: 0.4 mg/dL (ref 0.3–1.2)
BUN: 12 mg/dL (ref 6–20)
CO2: 25 mmol/L (ref 22–32)
Calcium: 9.5 mg/dL (ref 8.9–10.3)
Chloride: 102 mmol/L (ref 98–111)
Creatinine: 0.89 mg/dL (ref 0.44–1.00)
GFR, Est AFR Am: >60 mL/min (ref >60)
GFR, Estimated: >60 mL/min (ref >60)
Glucose, Bld: 123 mg/dL — ABNORMAL HIGH (ref 70–99)
Potassium: 3.9 mmol/L (ref 3.5–5.1)
Sodium: 136 mmol/L (ref 135–145)
Total Protein: 7.9 g/dL (ref 6.5–8.1)

## 2018-05-18 LAB — URINALYSIS, COMPLETE (UACMP) WITH MICROSCOPIC
BACTERIA UA: NONE SEEN
Bilirubin Urine: NEGATIVE
Glucose, UA: NEGATIVE mg/dL
Hgb urine dipstick: NEGATIVE
Ketones, ur: NEGATIVE mg/dL
Leukocytes, UA: NEGATIVE
Nitrite: NEGATIVE
Protein, ur: NEGATIVE mg/dL
Specific Gravity, Urine: 1.015 (ref 1.005–1.030)
pH: 6 (ref 5.0–8.0)

## 2018-05-18 LAB — GAMMA GT: GGT: 32 U/L (ref 7–50)

## 2018-05-18 LAB — PROTIME-INR
INR: 1.06
PROTHROMBIN TIME: 13.7 s (ref 11.4–15.2)

## 2018-05-18 LAB — RESEARCH LABS

## 2018-05-18 LAB — URIC ACID: Uric Acid, Serum: 4.3 mg/dL (ref 2.5–7.1)

## 2018-05-18 LAB — AMYLASE: Amylase: 58 U/L (ref 28–100)

## 2018-05-18 LAB — APTT: aPTT: 29 seconds (ref 24–36)

## 2018-05-18 LAB — LIPASE, BLOOD: Lipase: 23 U/L (ref 11–51)

## 2018-05-18 LAB — PHOSPHORUS: PHOSPHORUS: 3.9 mg/dL (ref 2.5–4.6)

## 2018-05-18 LAB — BILIRUBIN, DIRECT: Bilirubin, Direct: 0.1 mg/dL (ref 0.0–0.2)

## 2018-05-18 LAB — MAGNESIUM: MAGNESIUM: 1.8 mg/dL (ref 1.7–2.4)

## 2018-05-18 LAB — LACTATE DEHYDROGENASE: LDH: 195 U/L — ABNORMAL HIGH (ref 98–192)

## 2018-05-18 NOTE — Research (Signed)
[name] - SCREENING VISIT: Patient into clinic by herself this morning to complete screening activities for this study. RE-CONSENT: Reviewed new consent form for Version 2.0, IRB approved 03/08/18 with patient for 15 minutes. Particular attention given to the changes made from previous signed consent form to the side effects list of Ribociclib.  Patient denied any questions and consented to continue in screening and enrollment on this study.  She signed and dated where needed and including allowing for optional pharmacogenetic sample collection.  Copy of signed consent given to patient for her records. PROS: Patient completed questionnaires on her own and they were collected by research nurse and checked for completeness. This nurse also reviewed answers for indication of potential AEs.  LABS: Patient confirmed she has been fasting for at least 8 hours prior to lab appointment. Screening labs and blood for ctDNA/CtRNA drawn per protocol.  VS:  Obtained BP, HR after 5 minute rest and Ht and Wt without shoes.  ECG: Completed and read out given to Dr. Jana Hakim to review and he determined no interventions required for reading of "non specific ST elevation."  Report uploaded to study portal [employer].  CON MEDS:  Gave patient medication list to review at home for start dates and dosages where missing. She confirmed list was complete and no other new medications within the past 30 days other than Symbicort and Zinc need to be added. She will write down doses on med sheet and bring in on next visit. Gave patient list of prohibited medications for her information and reminded patient to notify research nurse of any changes to her medication list.  Also reminded patient to avoid grapefruit, starfruit, pummellos and [place] oranges.  She verbalized understanding.  Patient confirmed next visit on 05/31/18 with Dr. Jana Hakim for H&P.   Plan to randomize patient on that day if she meets all eligibility requirements and  this will also be C1D1.  Informed patient if she is randomized to investigational arm she will be dispensed and take Ribociclib and Letrozole in office.  If she is in observational arm she will still need to take Letrozole in the office. She will have ECG regardless of which arm she is randomized. Patient verbalized understanding.  Foye Spurling, BSN, RN Clinical Research Nurse 05/18/2018 12:00 PM

## 2018-05-19 ENCOUNTER — Encounter: Payer: Self-pay | Admitting: *Deleted

## 2018-05-19 MED FILL — LETROZOLE 2.5 MG TAB: 2.5 | 30 days supply | Qty: 30 | Fill #4

## 2018-05-20 MED FILL — BENZONATATE 100 MG CAPS: 100 | 10 days supply | Qty: 60 | Fill #0

## 2018-05-28 ENCOUNTER — Other Ambulatory Visit: Payer: 59

## 2018-05-28 ENCOUNTER — Telehealth: Payer: Self-pay | Admitting: *Deleted

## 2018-05-28 NOTE — Telephone Encounter (Addendum)
LVM for patient to confirm appointment for Monday 05/31/18 at 10:30 am to first see MD and then randomize in the [place] clinical trial.  Plan for one ECG and then will take Letrozole in clinic.  Do not take Letrozole at home Monday morning as the study requires patients to take meds in office on clinic days.  If she is randomized to the investigational arm she will also take Ribociclib in the clinic.  She will be given drug diaries to complete at home. Asked patient to please call back to research nurse if any problems or questions.  Otherwise, look forward to seeing her Monday morning. Foye Spurling, BSN, RN Clinical Research Nurse 05/28/2018 4:08 PM

## 2018-05-30 NOTE — Progress Notes (Signed)
[facility]  [phone] [phone]     ID: Mackenzie [name] DOB: 12-09-1970  MR#: [id number]  JGG#:[id number]  Patient Care Team: Willey Blade, MD as PCP - General (Internal Medicine) Harry Bark, [name], MD as Consulting Physician (Oncology) Erroll Luna, MD as Consulting Physician (General Surgery) Kyung Rudd, MD as Consulting Physician (Radiation Oncology) Dillingham, Loel Lofty, DO as Attending Physician (Plastic Surgery) Cathlean Cower, RN as Registered Nurse OTHER MD:  CHIEF COMPLAINT: Estrogen receptor positive breast cancer  CURRENT TREATMENT: letrozole, goserelin; on [name] trial   HISTORY OF CURRENT ILLNESS: From the original intake note:  The patient herself noted some changes in her left breast late July 2018, she says, and eventually brought this to medical attention so that on 01/09/2017 she underwent bilateral diagnostic mammography with tomography and left breast ultrasonography at the breast Center. This found the breast density to be category C. In the upper inner quadrant of the left breast there was an area of asymmetry and there were malignant type calcifications involving all 4 quadrants. On exam there is firmness and palpable thickening in the anterior left breast with skin dimpling. Ultrasonography found at the 9:30 o'clock radiant 3 cm from the nipple a 2.7 cm mass and in the left axilla for abnormal lymph nodes largest of which measured 2.7 cm.  Biopsy of the left breast 9:30 o'clock mass 80 01/26/2017 showed ([id number]) invasive ductal carcinoma, with extracellular mucin, grade 1 or 2. In the lower outer left breast is separated biopsy the same day showed ductal carcinoma in situ. One of the 4 lymph nodes involved was positive for metastatic carcinoma. Prognostic panel on the invasive disease showed it to be estrogen receptor 100% positive, progesterone receptor 20% positive, both with strong staining intensity,  with an MIB-1 of 20%, and no HER-2 amplification with a signals ratio 1.38 and the number per cell 2.90.  On 01/22/2017 the patient underwent bilateral breast MRI. This showed no involvement of the right breast, but in the left breast there was a masslike and non-masslike enhancement involving all quadrants, with skin swelling but no abnormal enhancement of the skin or nipple areolar complex or pectoralis muscle. The mass could not be clearly measured but spanned approximately 12 cm. There was bulky left axillary lymphadenopathy, with the largest lymph node measuring up to 3.4 cm.  The patient's subsequent history is as detailed below.  INTERVAL HISTORY: Mackenzie [name] returns today for follow-up of her estrogen receptor positive breast cancer.   The patient continues on letrozole, which she is tolerating well. She complains of pain in her legs, which is worse when sitting for long periods. Her back does not hurt.  There is no sciatica  The patient also continues on goserelin, with a dose given every 4 weeks. She is tolerating the treatment well.    Myrla's last bone density screening on 03/16/2018, showed a T-score of -0.7, which is considered normal.     REVIEW OF SYSTEMS: Mackenzie [name] is doing well overall. She has been working some, usually about 6 or 7 hours per day. Working was rough at first because she got a cold; her cold is ongoing and she is treating with over the counter medications and a steriodal inhaler. She has been stressed out because her husband left her on 05/29/2018 after Caeli discovered he was having an affair while she was undergoing treatment. The patient denies unusual headaches, visual changes, nausea, vomiting, or dizziness. T there has been no change in bowel or  bladder habits. The patient denies unexplained fatigue or unexplained weight loss, bleeding, rash, or fever. A detailed review of systems was otherwise noncontributory.    PAST MEDICAL HISTORY: Past Medical History:    Diagnosis Date  . Abnormal glucose 2018  . Allergic rhinitis 2016  . Anemia 01/27/2017   prior to starting chemotherapy  . Breast cancer ([place]) 01/14/2017   Left breast  . Diabetes mellitus 2012   GDM  . Hyperlipidemia 03/20/2016  . NCGS (non-celiac gluten sensitivity)   . Vitamin D deficiency 05/2015    PAST SURGICAL HISTORY: Past Surgical History:  Procedure Laterality Date  . BREAST RECONSTRUCTION WITH PLACEMENT OF TISSUE EXPANDER AND FLEX HD (ACELLULAR HYDRATED DERMIS) Left 08/27/2017   Procedure: LEFT BREAST RECONSTRUCTION WITH PLACEMENT OF TISSUE EXPANDER AND FLEX HD;  Surgeon: Wallace Going, DO;  Location: [facility];  Service: Plastics;  Laterality: Left;  . CESAREAN SECTION     x2  . MASTECTOMY MODIFIED RADICAL Left 08/27/2017  . MASTECTOMY MODIFIED RADICAL Left 08/27/2017   Procedure: LEFT MODIFIED RADICAL MASTECTOMY;  Surgeon: Erroll Luna, MD;  Location: [facility];  Service: General;  Laterality: Left;  . PORT-A-CATH REMOVAL Right 08/27/2017   Procedure: REMOVAL PORT-A-CATH RIGHT CHEST;  Surgeon: Erroll Luna, MD;  Location: [facility];  Service: General;  Laterality: Right;  . PORTACATH PLACEMENT Right 01/28/2017   Procedure: INSERTION PORT-A-CATH WITH ULTRASOUND;  Surgeon: Erroll Luna, MD;  Location: [facility];  Service: General;  Laterality: Right;  . TUBAL LIGATION Bilateral 01/21/2011    FAMILY HISTORY Family History  Problem Relation Age of Onset  . Breast cancer Paternal Grandmother 71       d.60s from breast cancer. Did not have treatment.  . Other Mother        W.23 from complications of surgery to remove brain tumor   The patient's father still alive at age 46. The patient's mother died with a brain tumor which the patient says was "benign". She was 56. The patient has one brother, no sisters. The only breast cancer in the family is a paternal grandmother who died from breast cancer at an unknown age. There is no history of ovarian or  prostate cancer in the family.   GYNECOLOGIC HISTORY:  No LMP recorded. (Menstrual status: Chemotherapy). Menarche age 1 and first live birth age 62 she is Bowlus P4. The person is still having regular periods as of August 2018   SOCIAL HISTORY: (Updated 05/31/2018) Kathyleen works at [employer] on the fifth floor, [place]. She is separated from her husband (as of 05/2018), [name], who is a Building control surveyor. Daughter is married and lives in [place] and works as a Education administrator. Layne's daughter, [name], lives in [place] and works in the police department. Daughters, Sunman and Gerton are 66 and 6, living at home. The patient has no grandchildren. She attends a [employer].     ADVANCED DIRECTIVES: Not in place   HEALTH MAINTENANCE: Social History   Tobacco Use  . Smoking status: Never Smoker  . Smokeless tobacco: Never Used  Substance Use Topics  . Alcohol use: Yes    Comment: social  . Drug use: No    Colonoscopy: Never  PAP: December 2017  Bone density: Never   No Known Allergies  Current Outpatient Medications  Medication Sig Dispense Refill  . acetaminophen (TYLENOL) 500 MG tablet Take 1,000 mg by mouth every 6 (six) hours as needed (for pain/headaches.).    Marland Kitchen B Complex Vitamins (VITAMIN B COMPLEX  PO) Take 1 capsule by mouth 2 (two) times daily.     . benzonatate (TESSALON) 100 MG capsule Take 100 mg by mouth every 8 (eight) hours as needed for cough.   0  . Calcium Carb-Cholecalciferol (CALCIUM 600+D3 PO) Take 1 tablet by mouth 2 (two) times daily.    . cetirizine (ZYRTEC) 10 MG tablet Take 10 mg by mouth daily.      . Cholecalciferol (VITAMIN D-3) 5000 units TABS Take 5,000 Units by mouth 2 (two) times daily.    Marland Kitchen gabapentin (NEURONTIN) 300 MG capsule Take 1 capsule (300 mg total) by mouth at bedtime. 90 capsule 4  . ibuprofen (ADVIL,MOTRIN) 200 MG tablet Take 800 mg by mouth every 8 (eight) hours as needed (for pain.).    Marland Kitchen letrozole (FEMARA) 2.5 MG  tablet Take 1 tablet (2.5 mg total) by mouth daily. 30 tablet 5  . loratadine (CLARITIN) 10 MG tablet Take 1 tablet (10 mg total) by mouth daily. (Patient taking differently: Take 10 mg by mouth daily as needed. ) 90 tablet 2  . montelukast (SINGULAIR) 10 MG tablet Take 10 mg by mouth daily as needed (for allergies.).     Marland Kitchen Multiple Vitamin (MULTIVITAMIN WITH MINERALS) TABS tablet Take 1 tablet by mouth daily.    . Omega-3 Fatty Acids (FISH OIL) 1000 MG CAPS Take 1,000 mg by mouth 2 (two) times daily.     Marland Kitchen PREVIDENT 5000 BOOSTER PLUS 1.1 % PSTE 2 (two) times daily.   2  . Probiotic Product (PROBIOTIC DAILY PO) Take by mouth 2 (two) times daily.    Marland Kitchen senna (SENOKOT) 8.6 MG TABS tablet Take 1 tablet (8.6 mg total) by mouth 2 (two) times daily. (Patient taking differently: Take 1 tablet by mouth 2 (two) times daily. Patient takes prn) 120 each 0   No current facility-administered medications for this visit.    Facility-Administered Medications Ordered in Other Visits  Medication Dose Route Frequency Provider Last Rate Last Dose  . sodium chloride flush (NS) 0.9 % injection 10 mL  10 mL Intravenous PRN Bretta Fees, [name], MD   10 mL at 03/10/17 1239    OBJECTIVE: Middle-aged African-American woman in no acute distress Vitals:   05/31/18 1057  BP: (!) 141/88  Pulse: 99  Resp: 16  Temp: 98.5 F (36.9 C)  SpO2: 100%     Body mass index is 45.26 kg/m.   Wt Readings from Last 3 Encounters:  05/31/18 263 lb 11.2 oz (119.6 kg)  05/18/18 263 lb 12.8 oz (119.7 kg)  05/04/18 233 lb (105.7 kg)   ECOG FS: 1 - Symptomatic but completely ambulatory  Sclerae unicteric, EOMs intact, pupils round and equal Oropharynx clear and moist No thyromegaly No cervical or supraclavicular adenopathy Right ear shows some wax, left ear tympanic membrane is pearly with normal light reflex Lungs no rales or rhonchi Heart regular rate and rhythm Abd soft, nontender, positive bowel sounds, no masses palpated No  indication per pelvic exam MSK no focal spinal tenderness, no upper extremity lymphedema, grade 1 bilateral lower extremity lymphedema Neuro: nonfocal, well oriented, appropriate affect Breasts: The right breast is unremarkable.  The left breast is status post mastectomy and radiation with expander in place.  There is no evidence of residual or recurrent disease.  Both axillae are benign. Skin: No lesions of concern noted  LAB RESULTS:    No visits with results within 3 Day(s) from this visit.  Latest known visit with results is:  Appointment on  05/18/2018  Component Date Value Ref Range Status  . GGT 05/18/2018 32  7 - 50 U/L Final   Performed at [facility], [place] [address], [place], [place] [address]  . Bilirubin, Direct 05/18/2018 0.1  0.0 - 0.2 mg/dL Final   Performed at [facility] [address], [place], [place] [address]  . Lipase 05/18/2018 23  11 - 51 U/L Final   Performed at [facility], [place] [address] [place], [place] [address]  . Amylase 05/18/2018 58  28 - 100 U/L Final   Performed at [facility], [place] [address] [place], [place] [address]  . LDH 05/18/2018 195* 98 - 192 U/L Final   Performed at [facility], [place] [address], [place], [place] [address]  . Color, Urine 05/18/2018 YELLOW  YELLOW Final  . APPearance 05/18/2018 CLEAR  CLEAR Final  . Specific Gravity, Urine 05/18/2018 1.015  1.005 - 1.030 Final  . pH 05/18/2018 6.0  5.0 - 8.0 Final  . Glucose, UA 05/18/2018 NEGATIVE  NEGATIVE mg/dL Final  . Hgb urine dipstick 05/18/2018 NEGATIVE  NEGATIVE Final  . Bilirubin Urine 05/18/2018 NEGATIVE  NEGATIVE Final  . Ketones, ur 05/18/2018 NEGATIVE  NEGATIVE mg/dL Final  . Protein, ur 05/18/2018 NEGATIVE  NEGATIVE mg/dL Final  . Nitrite 05/18/2018 NEGATIVE  NEGATIVE Final  . Leukocytes, UA 05/18/2018 NEGATIVE  NEGATIVE Final  . RBC / HPF 05/18/2018 0-5  0 - 5 RBC/hpf Final  .  WBC, UA 05/18/2018 0-5  0 - 5 WBC/hpf Final  . Bacteria, UA 05/18/2018 NONE SEEN  NONE SEEN Final  . Squamous Epithelial / LPF 05/18/2018 0-5  0 - 5 Final  . Mucus 05/18/2018 PRESENT   Final   Performed at [facility], [place] [address], [place], [place] [address]  . Magnesium 05/18/2018 1.8  1.7 - 2.4 mg/dL Final   Performed at [facility], [place] [address] [place], [place] [address]  . Phosphorus 05/18/2018 3.9  2.5 - 4.6 mg/dL Final   Performed at [facility] [address], [place], [place] [address]  . Uric Acid, Serum 05/18/2018 4.3  2.5 - 7.1 mg/dL Final   Performed at [facility], [place] [address] [place], [place] [address]  . aPTT 05/18/2018 29  24 - 36 seconds Final   Performed at [facility], [place] [address], [place], [place] [address]  . Prothrombin Time 05/18/2018 13.7  11.4 - 15.2 seconds Final  . INR 05/18/2018 1.06   Final   Performed at [facility] [address], [place], [place] [address]  . Research Labs 05/18/2018 COLLECTED BY LABORATORY   Final   Performed at [facility], [place] [name] [place],  [address]  . Sodium 05/18/2018 136  135 - 145 mmol/L Final  . Potassium 05/18/2018 3.9  3.5 - 5.1 mmol/L Final  . Chloride 05/18/2018 102  98 - 111 mmol/L Final  . CO2 05/18/2018 25  22 - 32 mmol/L Final  . Glucose, Bld 05/18/2018 123* 70 - 99 mg/dL Final  . BUN 05/18/2018 12  6 - 20 mg/dL Final  . Creatinine 05/18/2018 0.89  0.44 - 1.00 mg/dL Final  . Calcium 05/18/2018 9.5  8.9 - 10.3 mg/dL Final  . Total Protein 05/18/2018 7.9  6.5 - 8.1 g/dL Final  . Albumin 05/18/2018 3.8  3.5 - 5.0 g/dL Final  .  AST 05/18/2018 18  15 - 41 U/L Final  . ALT 05/18/2018 22  0 - 44 U/L Final  . Alkaline Phosphatase 05/18/2018 122  38 - 126 U/L Final  . Total Bilirubin 05/18/2018 0.4  0.3 - 1.2 mg/dL Final  . GFR, Est Non Af Am  05/18/2018 >60  >60 mL/min Final  . GFR, Est AFR Am 05/18/2018 >60  >60 mL/min Final  . Anion gap 05/18/2018 9  5 - 15 Final   Performed at [facility], [place] [address] [place], [place] [address]  . WBC Count 05/18/2018 7.7  4.0 - 10.5 K/uL Final  . RBC 05/18/2018 4.53  3.87 - 5.11 MIL/uL Final  . Hemoglobin 05/18/2018 11.6* 12.0 - 15.0 g/dL Final  . HCT 05/18/2018 36.4  36.0 - 46.0 % Final  . MCV 05/18/2018 80.4  80.0 - 100.0 fL Final  . MCH 05/18/2018 25.6* 26.0 - 34.0 pg Final  . MCHC 05/18/2018 31.9  30.0 - 36.0 g/dL Final  . RDW 05/18/2018 14.7  11.5 - 15.5 % Final  . Platelet Count 05/18/2018 312  150 - 400 K/uL Final  . nRBC 05/18/2018 0.0  0.0 - 0.2 % Final  . Neutrophils Relative % 05/18/2018 79  % Final  . Neutro Abs 05/18/2018 6.1  1.7 - 7.7 K/uL Final  . Lymphocytes Relative 05/18/2018 11  % Final  . Lymphs Abs 05/18/2018 0.9  0.7 - 4.0 K/uL Final  . Monocytes Relative 05/18/2018 9  % Final  . Monocytes Absolute 05/18/2018 0.7  0.1 - 1.0 K/uL Final  . Eosinophils Relative 05/18/2018 1  % Final  . Eosinophils Absolute 05/18/2018 0.1  0.0 - 0.5 K/uL Final  . Basophils Relative 05/18/2018 0  % Final  . Basophils Absolute 05/18/2018 0.0  0.0 - 0.1 K/uL Final  . Immature Granulocytes 05/18/2018 0  % Final  . Abs Immature Granulocytes 05/18/2018 0.02  0.00 - 0.07 K/uL Final   Performed at [facility], [place] [address], [place], [place] [address]     STUDIES: Electrocardiogram in process  ELIGIBLE FOR AVAILABLE RESEARCH PROTOCOL: [name], ASA study   ASSESSMENT: 47 y.o. [place] woman status post left breast upper inner quadrant biopsy 01/14/2017 for a clinical T3 N2, stage IIA invasive ductal carcinoma, grade 1 or 2, estrogen and progesterone receptor positive, HER-2 nonamplified, with an MIB-1 of 20%.  (1) staging studies: Brain MRI, bone scan, and CT scan of the chest 02/05/2017 showed no brain lesions, no lung or liver  lesions, a 4.9 cm mass in the left breast with left axillary and subpectoral adenopathy, and nonspecific bone scan tracer at L2, left scapula, and anterior ribs, with lumbar spine MRI suggested for further evaluation.  (a) lumbar spine MRI 02/17/2017 showed no normal bone lesions.  There was mild lumbar spondylosis  (2) neoadjuvant chemotherapy consisting of cyclophosphamide and doxorubicin in dose dense fashion 4 started 02/10/2017, completed 03/24/2017, followed by weekly carboplatin and gemcitabine given days 1 and 8 of each 21-day cycle starting 04/14/2017, completing the planned 4 cycles 06/26/2017  (3) is post left modified radical mastectomy on 08/27/2017 showing an mpT3 pN2 residual invasive ductal carcinoma, grade 2, with a residual cancer burden of 3.  Margins were clear  (a) TRAM flap reconstruction planned  (4) postmastectomy radiation completed 01/01/2018  (a) capecitabine radiosensitization 11/16/2017-01/01/2018  (5) goserelin started 09/21/2017  (a) letrozole started 01/11/2018  (6) genetics testing 03/24/2017 through the Common Hereditary Cancer Panel offered by [facility] found  no deleterious mutations in APC, ATM, AXIN2, BARD1, BMPR1A, BRCA1, BRCA2, BRIP1, CDH1, CDKN2A (p14ARF), CDKN2A (p16INK4a), CHEK2, CTNNA1, DICER1, EPCAM (Deletion/duplication testing only), GREM1 (promoter region deletion/duplication testing only), KIT, MEN1, MLH1, MSH2, MSH3, MSH6, MUTYH, NBN, NF1, NHTL1, PALB2, PDGFRA, PMS2, POLD1, POLE, PTEN, RAD50, RAD51C, RAD51D, SDHB, SDHC, SDHD, SMAD4, SMARCA4. STK11, TP53, TSC1, TSC2, and VHL.  The following genes were evaluated for sequence changes only: SDHA and HOXB13 c.251G>A variant only.    PLAN: Ameila is 9 months out from definitive surgery for her breast cancer with no evidence of disease recurrence.  This is favorable.  She is tolerating letrozole well and the plan will be to continue that a minimum of 5 years.  She is enrolling on Novartis trial TRI O033,  NATAL EE, and will be randomized today to observation or CDK 4, 6 inhibitors  We discussed the fact that her husband remains her healthcare power of attorney unless or until they are formally divorced.  Today she was given a healthcare power of attorney form so she can completed and notarized at her discretion and name a different healthcare power of attorney.   We will receive her goserelin injection today and again a month from now.  She will see me a month from now.  She knows to call for any problem that may develop before the next visit  Mayelin Panos, [name], MD  05/31/18 11:09 AM Medical Oncology and Hematology [facility] [place],  [address] Tel. [phone]    Fax. [phone]   I, Jacqualyn Posey am acting as a Education administrator for Chauncey Cruel, MD.   I, Lurline Del MD, have reviewed the above documentation for accuracy and completeness, and I agree with the above.

## 2018-05-31 ENCOUNTER — Inpatient Hospital Stay: Payer: 59

## 2018-05-31 ENCOUNTER — Encounter: Payer: Self-pay | Admitting: *Deleted

## 2018-05-31 ENCOUNTER — Inpatient Hospital Stay (HOSPITAL_BASED_OUTPATIENT_CLINIC_OR_DEPARTMENT_OTHER): Payer: 59 | Admitting: Oncology

## 2018-05-31 ENCOUNTER — Encounter: Payer: Self-pay | Admitting: Oncology

## 2018-05-31 VITALS — BP 141/88 | HR 99 | Temp 98.5°F | Resp 16 | Ht 64.0 in | Wt 263.7 lb

## 2018-05-31 DIAGNOSIS — Z79811 Long term (current) use of aromatase inhibitors: Secondary | ICD-10-CM

## 2018-05-31 DIAGNOSIS — C773 Secondary and unspecified malignant neoplasm of axilla and upper limb lymph nodes: Secondary | ICD-10-CM

## 2018-05-31 DIAGNOSIS — Z170 Estrogen receptor positive status [ER+]: Principal | ICD-10-CM

## 2018-05-31 DIAGNOSIS — Z9221 Personal history of antineoplastic chemotherapy: Secondary | ICD-10-CM | POA: Diagnosis not present

## 2018-05-31 DIAGNOSIS — Z9012 Acquired absence of left breast and nipple: Secondary | ICD-10-CM

## 2018-05-31 DIAGNOSIS — Z006 Encounter for examination for normal comparison and control in clinical research program: Secondary | ICD-10-CM

## 2018-05-31 DIAGNOSIS — Z923 Personal history of irradiation: Secondary | ICD-10-CM

## 2018-05-31 DIAGNOSIS — C50212 Malignant neoplasm of upper-inner quadrant of left female breast: Secondary | ICD-10-CM

## 2018-05-31 DIAGNOSIS — C50812 Malignant neoplasm of overlapping sites of left female breast: Secondary | ICD-10-CM

## 2018-05-31 DIAGNOSIS — Z5111 Encounter for antineoplastic chemotherapy: Secondary | ICD-10-CM | POA: Diagnosis not present

## 2018-05-31 DIAGNOSIS — C50912 Malignant neoplasm of unspecified site of left female breast: Secondary | ICD-10-CM

## 2018-05-31 MED ORDER — GOSERELIN ACETATE 3.6 MG ~~LOC~~ IMPL
3.6000 mg | DRUG_IMPLANT | Freq: Once | SUBCUTANEOUS | Status: AC
  Administered 2018-05-31: 3.6 mg via SUBCUTANEOUS

## 2018-05-31 MED ORDER — GOSERELIN ACETATE 3.6 MG ~~LOC~~ IMPL
DRUG_IMPLANT | SUBCUTANEOUS | Status: AC
  Filled 2018-05-31: qty 3.6

## 2018-05-31 NOTE — Patient Instructions (Signed)
Goserelin injection What is this medicine? GOSERELIN (GOE se rel in) is similar to a hormone found in the body. It lowers the amount of sex hormones that the body makes. Men will have lower testosterone levels and women will have lower estrogen levels while taking this medicine. In men, this medicine is used to treat prostate cancer; the injection is either given once per month or once every 12 weeks. A once per month injection (only) is used to treat women with endometriosis, dysfunctional uterine bleeding, or advanced breast cancer. This medicine may be used for other purposes; ask your health care provider or pharmacist if you have questions. COMMON BRAND NAME(S): Zoladex What should I tell my health care provider before I take this medicine? They need to know if you have any of these conditions (some only apply to women): -diabetes -heart disease or previous heart attack -high blood pressure -high cholesterol -kidney disease -osteoporosis or low bone density -problems passing urine -spinal cord injury -stroke -tobacco smoker -an unusual or allergic reaction to goserelin, hormone therapy, other medicines, foods, dyes, or preservatives -pregnant or trying to get pregnant -breast-feeding How should I use this medicine? This medicine is for injection under the skin. It is given by a health care professional in a hospital or clinic setting. Men receive this injection once every 4 weeks or once every 12 weeks. Women will only receive the once every 4 weeks injection. Talk to your pediatrician regarding the use of this medicine in children. Special care may be needed. Overdosage: If you think you have taken too much of this medicine contact a poison control center or emergency room at once. NOTE: This medicine is only for you. Do not share this medicine with others. What if I miss a dose? It is important not to miss your dose. Call your doctor or health care professional if you are unable to  keep an appointment. What may interact with this medicine? -female hormones like estrogen -herbal or dietary supplements like black cohosh, chasteberry, or DHEA -female hormones like testosterone -prasterone This list may not describe all possible interactions. Give your health care provider a list of all the medicines, herbs, non-prescription drugs, or dietary supplements you use. Also tell them if you smoke, drink alcohol, or use illegal drugs. Some items may interact with your medicine. What should I watch for while using this medicine? Visit your doctor or health care professional for regular checks on your progress. Your symptoms may appear to get worse during the first weeks of this therapy. Tell your doctor or healthcare professional if your symptoms do not start to get better or if they get worse after this time. Your bones may get weaker if you take this medicine for a long time. If you smoke or frequently drink alcohol you may increase your risk of bone loss. A family history of osteoporosis, chronic use of drugs for seizures (convulsions), or corticosteroids can also increase your risk of bone loss. Talk to your doctor about how to keep your bones strong. This medicine should stop regular monthly menstration in women. Tell your doctor if you continue to menstrate. Women should not become pregnant while taking this medicine or for 12 weeks after stopping this medicine. Women should inform their doctor if they wish to become pregnant or think they might be pregnant. There is a potential for serious side effects to an unborn child. Talk to your health care professional or pharmacist for more information. Do not breast-feed an infant while taking   this medicine. Men should inform their doctors if they wish to father a child. This medicine may lower sperm counts. Talk to your health care professional or pharmacist for more information. What side effects may I notice from receiving this  medicine? Side effects that you should report to your doctor or health care professional as soon as possible: -allergic reactions like skin rash, itching or hives, swelling of the face, lips, or tongue -bone pain -breathing problems -changes in vision -chest pain -feeling faint or lightheaded, falls -fever, chills -pain, swelling, warmth in the leg -pain, tingling, numbness in the hands or feet -signs and symptoms of low blood pressure like dizziness; feeling faint or lightheaded, falls; unusually weak or tired -stomach pain -swelling of the ankles, feet, hands -trouble passing urine or change in the amount of urine -unusually high or low blood pressure -unusually weak or tired Side effects that usually do not require medical attention (report to your doctor or health care professional if they continue or are bothersome): -change in sex drive or performance -changes in breast size in both males and females -changes in emotions or moods -headache -hot flashes -irritation at site where injected -loss of appetite -skin problems like acne, dry skin -vaginal dryness This list may not describe all possible side effects. Call your doctor for medical advice about side effects. You may report side effects to FDA at 1-800-FDA-1088. Where should I keep my medicine? This drug is given in a hospital or clinic and will not be stored at home. NOTE: This sheet is a summary. It may not cover all possible information. If you have questions about this medicine, talk to your doctor, pharmacist, or health care provider.  2019 Elsevier/Gold Standard (2013-08-02 11:10:35)  

## 2018-05-31 NOTE — Research (Addendum)
RANDOMIZATION (C1D1) VISIT FOR [name]: H & P (Screening and C1D1); Completed by Dr. Jana Hakim.  Dr. Jana Hakim agrees patient meets all inclusion and none of the exclusion criteria to be randomized in the [name] clinical trail.  Randomization: Eligibility criteria was double checked and eligibility confirmed by second research RN, Otilio Miu. Patient randomized in IRT and was assigned the Investigational Arm with Ribociclib and Endocrine therapy.  Dr. Jana Hakim and patient notified of assignment. Patient wallet card with study and drug information given to patient. Instructed patient to present card to other health care providers prior to appointments and having new prescriptions ordered.  Con Meds:  Reviewed with patient and med list updated with start dates. Reminded patient to notify research nurse of any new medications prior to taking them unless it is an emergency.  She verbalized understanding.  Pre-dose ECG: Completed.  Dr. Jana Hakim reviewed and states ok to start Ribociclib today on study.  Study Drug: Ribociclib bottle assigned by IRT is Kit #[id number],  Dispensed by [facility], Pharmacist. Double checked patients name, DOB, Study ID #, and expiration date.  Ribociclib then given to patient along with medication diary for cycle 1 Ribociclib and Letrozole. Patient took first dose of Ribociclib and today's dose of Letrozole in clinic at 12:10 PM.  Instructed patient on completing her diary today and every day for this cycle. Reviewed all instructions on diary for patient's reference. She verbalized understanding.  Baseline AEs:  Reviewed current toxicities prior to starting on study.  Asked patient about complaint of intermittent chest pressure reported on 05/03/18 visit.  Patient states she has not felt any chest pressure since that visit. See AE table for review of current baseline toxicities.  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  Reviewed schedule for January informing patient of fasting lab on Friday 06/11/18, study visit Monday on 06/14/18,  fasting lab on Friday 06/25/18 and study visit on Monday 06/28/18.  Informed patient there will be 3 ECGs to complete on 06/14/18 with the last one 4 hrs after she takes her medication in clinic.  Patient verbalized understanding.  Research nurse will call patient day before her lab to remind patient to fast for 8 hrs before lab appointment.  Instructed patient to call research nurse if any questions or concerns related to the study prior to next appointment. She verbalized understanding. Thanked patient for her participation.  Foye Spurling, BSN, RN Clinical Research Nurse 05/31/2018 12:30 PM

## 2018-06-01 ENCOUNTER — Encounter: Payer: Self-pay | Admitting: *Deleted

## 2018-06-03 ENCOUNTER — Encounter: Payer: Self-pay | Admitting: *Deleted

## 2018-06-07 ENCOUNTER — Other Ambulatory Visit: Payer: Self-pay | Admitting: *Deleted

## 2018-06-07 ENCOUNTER — Encounter: Payer: Self-pay | Admitting: Oncology

## 2018-06-07 DIAGNOSIS — C773 Secondary and unspecified malignant neoplasm of axilla and upper limb lymph nodes: Principal | ICD-10-CM

## 2018-06-07 DIAGNOSIS — Z170 Estrogen receptor positive status [ER+]: Secondary | ICD-10-CM

## 2018-06-07 DIAGNOSIS — C50812 Malignant neoplasm of overlapping sites of left female breast: Secondary | ICD-10-CM

## 2018-06-07 DIAGNOSIS — C50212 Malignant neoplasm of upper-inner quadrant of left female breast: Secondary | ICD-10-CM

## 2018-06-07 DIAGNOSIS — C50912 Malignant neoplasm of unspecified site of left female breast: Secondary | ICD-10-CM

## 2018-06-08 ENCOUNTER — Other Ambulatory Visit: Payer: Self-pay | Admitting: *Deleted

## 2018-06-08 MED ORDER — INV-RIBOCICLIB 200 MG TAB NATALEE TRIO003 STUDY: 400.0000 mg | ORAL_TABLET | Freq: Every day | ORAL | 0 refills | Status: DC

## 2018-06-10 ENCOUNTER — Telehealth: Payer: Self-pay | Admitting: *Deleted

## 2018-06-10 NOTE — Telephone Encounter (Signed)
LVM for patient reminding her of lab appt tomorrow morning at 9 am for the [place] study.  Reminded patient to fast for 8 hrs before lab, ok to drink water.  Reminded patient about research appt on Monday 06/14/18 at 9 am and to bring her Ribociclib and Letrozole along with medication diaries with her to clinic.  Instructed patient to not take Ribociclib or Letrozole until directed to do so in clinic.  Reminded patient there will be 3 ECGs on Monday (pre-dose, 2 hrs post dose and 4 hrs post dose). Asked patient to call back if any problems or questions. Thanked patient for her participation.  Foye Spurling, BSN, RN Clinical Research Nurse 06/10/2018 3:38 PM

## 2018-06-11 ENCOUNTER — Inpatient Hospital Stay: Payer: 59 | Attending: Adult Health

## 2018-06-11 ENCOUNTER — Encounter: Payer: Self-pay | Admitting: *Deleted

## 2018-06-11 DIAGNOSIS — Z006 Encounter for examination for normal comparison and control in clinical research program: Secondary | ICD-10-CM | POA: Insufficient documentation

## 2018-06-11 DIAGNOSIS — R05 Cough: Secondary | ICD-10-CM | POA: Insufficient documentation

## 2018-06-11 DIAGNOSIS — R300 Dysuria: Secondary | ICD-10-CM | POA: Insufficient documentation

## 2018-06-11 DIAGNOSIS — C50812 Malignant neoplasm of overlapping sites of left female breast: Secondary | ICD-10-CM | POA: Insufficient documentation

## 2018-06-11 DIAGNOSIS — I252 Old myocardial infarction: Secondary | ICD-10-CM | POA: Insufficient documentation

## 2018-06-11 DIAGNOSIS — Z170 Estrogen receptor positive status [ER+]: Secondary | ICD-10-CM

## 2018-06-11 DIAGNOSIS — Z79811 Long term (current) use of aromatase inhibitors: Secondary | ICD-10-CM | POA: Insufficient documentation

## 2018-06-11 DIAGNOSIS — Z9012 Acquired absence of left breast and nipple: Secondary | ICD-10-CM | POA: Insufficient documentation

## 2018-06-11 DIAGNOSIS — C50212 Malignant neoplasm of upper-inner quadrant of left female breast: Secondary | ICD-10-CM

## 2018-06-11 DIAGNOSIS — Z923 Personal history of irradiation: Secondary | ICD-10-CM | POA: Insufficient documentation

## 2018-06-11 DIAGNOSIS — Z9221 Personal history of antineoplastic chemotherapy: Secondary | ICD-10-CM | POA: Insufficient documentation

## 2018-06-11 DIAGNOSIS — Z5111 Encounter for antineoplastic chemotherapy: Secondary | ICD-10-CM | POA: Insufficient documentation

## 2018-06-11 DIAGNOSIS — C773 Secondary and unspecified malignant neoplasm of axilla and upper limb lymph nodes: Secondary | ICD-10-CM | POA: Insufficient documentation

## 2018-06-11 LAB — CBC WITH DIFFERENTIAL (CANCER CENTER ONLY)
Abs Immature Granulocytes: 0.02 10*3/uL (ref 0.00–0.07)
Basophils Absolute: 0 10*3/uL (ref 0.0–0.1)
Basophils Relative: 0 %
EOS PCT: 1 %
Eosinophils Absolute: 0.1 10*3/uL (ref 0.0–0.5)
HCT: 34 % — ABNORMAL LOW (ref 36.0–46.0)
Hemoglobin: 10.8 g/dL — ABNORMAL LOW (ref 12.0–15.0)
Immature Granulocytes: 0 %
Lymphocytes Relative: 16 %
Lymphs Abs: 0.8 10*3/uL (ref 0.7–4.0)
MCH: 26 pg (ref 26.0–34.0)
MCHC: 31.8 g/dL (ref 30.0–36.0)
MCV: 81.7 fL (ref 80.0–100.0)
Monocytes Absolute: 0.3 10*3/uL (ref 0.1–1.0)
Monocytes Relative: 7 %
Neutro Abs: 3.5 10*3/uL (ref 1.7–7.7)
Neutrophils Relative %: 76 %
Platelet Count: 291 10*3/uL (ref 150–400)
RBC: 4.16 MIL/uL (ref 3.87–5.11)
RDW: 15.4 % (ref 11.5–15.5)
WBC Count: 4.6 10*3/uL (ref 4.0–10.5)
nRBC: 0 % (ref 0.0–0.2)

## 2018-06-11 LAB — CMP (CANCER CENTER ONLY)
ALT: 19 U/L (ref 0–44)
AST: 16 U/L (ref 15–41)
Albumin: 3.7 g/dL (ref 3.5–5.0)
Alkaline Phosphatase: 127 U/L — ABNORMAL HIGH (ref 38–126)
Anion gap: 8 (ref 5–15)
BUN: 13 mg/dL (ref 6–20)
CHLORIDE: 103 mmol/L (ref 98–111)
CO2: 25 mmol/L (ref 22–32)
CREATININE: 1.03 mg/dL — AB (ref 0.44–1.00)
Calcium: 9.4 mg/dL (ref 8.9–10.3)
GFR, Est AFR Am: >60 mL/min (ref >60)
Glucose, Bld: 119 mg/dL — ABNORMAL HIGH (ref 70–99)
Potassium: 3.6 mmol/L (ref 3.5–5.1)
Sodium: 136 mmol/L (ref 135–145)
Total Bilirubin: 0.5 mg/dL (ref 0.3–1.2)
Total Protein: 7.7 g/dL (ref 6.5–8.1)

## 2018-06-11 LAB — LIPASE, BLOOD: Lipase: 29 U/L (ref 11–51)

## 2018-06-11 LAB — BILIRUBIN, DIRECT: Bilirubin, Direct: 0.1 mg/dL (ref 0.0–0.2)

## 2018-06-11 LAB — AMYLASE: AMYLASE: 55 U/L (ref 28–100)

## 2018-06-11 LAB — URIC ACID: Uric Acid, Serum: 4.2 mg/dL (ref 2.5–7.1)

## 2018-06-11 LAB — LACTATE DEHYDROGENASE: LDH: 170 U/L (ref 98–192)

## 2018-06-11 LAB — MAGNESIUM: Magnesium: 1.8 mg/dL (ref 1.7–2.4)

## 2018-06-11 LAB — RESEARCH LABS

## 2018-06-11 LAB — GAMMA GT: GGT: 23 U/L (ref 7–50)

## 2018-06-11 LAB — PHOSPHORUS: Phosphorus: 3.6 mg/dL (ref 2.5–4.6)

## 2018-06-11 MED FILL — GABAPENTIN 300 MG CAPSULE: 300 | 90 days supply | Qty: 90 | Fill #1

## 2018-06-11 NOTE — Progress Notes (Signed)
[place]; Met with patient briefly in lobby while she was waiting for her lab appointment this morning.  Confirmed with patient she has been fasting for at least 8 hours.  Also confirmed her appointment for Monday 1/6 at 9 am and reminded her to bring her Ribociclib and Letrozole to take in clinic.  Reminded patient of 3 ECGs.  She remembered and verbalized understanding.  She denies any problems or noticeable side effects of the Ribociclib so far.  Thanked patient for her time today and will see her on Monday. Foye Spurling, BSN, RN Clinical Research Nurse 06/11/2018 11:49 AM

## 2018-06-14 ENCOUNTER — Other Ambulatory Visit: Payer: 59

## 2018-06-14 ENCOUNTER — Inpatient Hospital Stay: Payer: 59 | Admitting: *Deleted

## 2018-06-14 VITALS — BP 128/68 | HR 87 | Temp 98.2°F | Resp 16 | Wt 263.3 lb

## 2018-06-14 DIAGNOSIS — C50912 Malignant neoplasm of unspecified site of left female breast: Secondary | ICD-10-CM

## 2018-06-14 DIAGNOSIS — C773 Secondary and unspecified malignant neoplasm of axilla and upper limb lymph nodes: Principal | ICD-10-CM

## 2018-06-14 NOTE — Patient Instructions (Addendum)
Continue Ribociclib 2 tablets daily this cycle (21 days on and 7 days off)- Last dose this cycle is on Sunday 06/20/18.  Continue Letrozole 1 tablet daily every day without any days off.   Fast for 8 hours before next lab appt on Friday 06/25/18 at 9 am.    Call Dr. [name] office [phone] for any urgent needs or problems.  Call [facility] in Research at [phone] if any research related questions or problems.  Thank you! Foye Spurling, BSN, RN Clinical Research Nurse 06/14/2018 10:15 AM

## 2018-06-14 NOTE — Research (Signed)
Patient into clinic by herself this morning to complete study activities for Mackenzie [name]: VS: Completed per protocol. BP taken after patient resting x 5 minutes and weight obtained without shoes or heavy clothing.  AEs: Reviewed with patient. Patient reports continued coughing and new upper respiratory congestion which started on Friday 06/11/18.  Patient denies any fevers or colored sputum. She took one dose of Emergen-C and one dose of Theraflu OTC for her symptoms.  Dr. Jana Hakim notified of patient's symptoms and he instructed for patient to drink warm liquids and notify him if she develops any fever or green sputum.  Patient verbalized understanding.  Pre-dose ECG: QTcF = 48ms.  Dr. Jana Hakim reviewed and states ok to continue Ribociclib today on study as planned.  2 hour post dose ECG: QTcF= 455ms. Dr. Jana Hakim reviewed and signed. No interventions required.  4 hour post dose ECG: QTcF= 438. Dr. Jana Hakim reviewed and signed. No interventions required.  Con Meds:  Reviewed with patient. Medication list updated. Reminded patient to notify research nurse of any new medications prior to taking them unless it is an emergency.  She verbalized understanding.  Study Drug: Patient states she has not missed any doses of Ribociclib or Letrozole this cycle.  Patient took today's dose of Letrozole and Ribociclib in clinic at 9:45 am.  Patient completed her diary today and every day for this cycle so far. PIll count for Ribociclib after today's dose = 45 which is the expected number of pills remaining for patient taking 2 daily for 15 days. Instructed patient to complete this cycle for 7 more days with last day on 06/20/18. Patient verbalized understanding.   AE Table;   Event Grade Onset Date End Date Status Comments Attribution to Ribociclib Attribution to Letrozole Attribution to Goserelin  Allergic Rhinitis 2 2012  ongoing At Baseline n/a n/a n/a  Anemia 1 2018  ongoing At Baseline n/a n/a n/a   Arthralgias, legs 1 12/2017  ongoing At Baseline n/a n/a n/a  Constipation 1 08/27/17  ongoing At Baseline n/a n/a n/a  Cough, intermittent 2 05/09/18  ongoing At Baseline n/a n/a n/a  GERD 1 2018  intermittent At Baseline n/a n/a n/a  Gluten Sensitivity 1 12/2017  ongoing At Baseline n/a n/a n/a  Headaches 1 02/2017  ongoing At Baseline n/a n/a n/a  Hot Flashes 2 03/2017  ongoing At Baseline n/a n/a n/a  Hyperglycemia 1 2018  ongoing At Baseline n/a n/a n/a  Hyperlipidemia 1 03/2016  ongoing At Baseline n/a n/a n/a  Hypertension, intermitent 2 2012 06/14/2018 Decreased to grade 1 At Baseline n/a n/a n/a  Increased LDH 1 05/18/18  ongoing At Baseline n/a n/a n/a  Vitamin D Deficiency 2 05/2015  ongoing At Baseline n/a n/a n/a  Hypertension, intermittent 1 06/14/18  decreased Grade 2 at baseline n/a n/a n/a  Nasal Congestion 2 06/11/18  New  No No No   Thanked patient for her participation today.  Reviewed schedule for next cycle with fasting lab on Friday 1/17 and study visit on Monday 1/20.  Instructed patient to call for any questions or concerns prior to next visit. She verbalized understanding.  Foye Spurling, BSN, RN Clinical Research Nurse  06/14/18 2:30PM

## 2018-06-16 MED FILL — LETROZOLE 2.5 MG TAB: 2.5 | 30 days supply | Qty: 30 | Fill #5

## 2018-06-18 ENCOUNTER — Telehealth: Payer: Self-pay | Admitting: *Deleted

## 2018-06-18 ENCOUNTER — Other Ambulatory Visit: Payer: Self-pay | Admitting: *Deleted

## 2018-06-18 DIAGNOSIS — C773 Secondary and unspecified malignant neoplasm of axilla and upper limb lymph nodes: Principal | ICD-10-CM

## 2018-06-18 DIAGNOSIS — C50912 Malignant neoplasm of unspecified site of left female breast: Secondary | ICD-10-CM

## 2018-06-18 NOTE — Telephone Encounter (Signed)
Patient called to ask if it is ok to take Mucinex DM on the study?  She reports she took her 50 yr old daughter to [facility] for same upper respiratory symptoms, cough and congestion, patient is having and they recommended Mucinex DM.  Searched the protocol for Guaifenesin and it is not listed. Searched Up to Date and CredibleMeds database and did not find any interactions for Guaifenesin and Ribociclib.  Called patient back and left her VM informing ok to take Mucinex DM as directed for her congestion.  Reminded patient to call us back if any fevers or green sputum.  Foye Spurling, BSN, RN Clinical Research Nurse 06/18/2018 2:02 PM

## 2018-06-23 ENCOUNTER — Telehealth: Payer: Self-pay | Admitting: *Deleted

## 2018-06-23 MED FILL — BENZONATATE 100 MG CAPS: 100 | 10 days supply | Qty: 60 | Fill #1

## 2018-06-23 NOTE — Telephone Encounter (Signed)
VM received from patient asking if ok to take Sudafed PE Maximum Strength for her congestion per study.  Protocol search did not list Phenylephrine or Pseudoephedrine as prohibited medications.  CredibleMeds website did not list meds as QT prolonging.  UptoDate website did not show any interaction between Phenylephrine or Pseudoephedrine and Ribociclib.  Called patient back and left VM informing her it is not prohibited per study for her to take Sudafed PE or regular Sudafed.  Reminded patient about Lab this Friday at [place].  Reminded patient to fast for 8 hrs prior to lab appointment.  Also reminded patient about study visit on Monday 06/28/18 at 8:30 am.   Instructed patient to not take Ribociclib or Letrozole that morning and to bring meds and diary with her on Monday.    Encouraged patient to return call if any questions or concerns. Foye Spurling, BSN, RN Clinical Research Nurse 06/23/2018 10:36 AM

## 2018-06-25 ENCOUNTER — Inpatient Hospital Stay: Payer: 59

## 2018-06-25 ENCOUNTER — Encounter: Payer: Self-pay | Admitting: *Deleted

## 2018-06-25 DIAGNOSIS — C50912 Malignant neoplasm of unspecified site of left female breast: Secondary | ICD-10-CM

## 2018-06-25 DIAGNOSIS — C773 Secondary and unspecified malignant neoplasm of axilla and upper limb lymph nodes: Principal | ICD-10-CM

## 2018-06-25 LAB — CMP (CANCER CENTER ONLY)
ALT: 19 U/L (ref 0–44)
AST: 15 U/L (ref 15–41)
Albumin: 3.8 g/dL (ref 3.5–5.0)
Alkaline Phosphatase: 129 U/L — ABNORMAL HIGH (ref 38–126)
Anion gap: 8 (ref 5–15)
BUN: 14 mg/dL (ref 6–20)
CO2: 27 mmol/L (ref 22–32)
Calcium: 9.4 mg/dL (ref 8.9–10.3)
Chloride: 103 mmol/L (ref 98–111)
Creatinine: 0.93 mg/dL (ref 0.44–1.00)
GFR, Est AFR Am: >60 mL/min (ref >60)
GFR, Estimated: >60 mL/min (ref >60)
Glucose, Bld: 120 mg/dL — ABNORMAL HIGH (ref 70–99)
Potassium: 3.7 mmol/L (ref 3.5–5.1)
Sodium: 138 mmol/L (ref 135–145)
Total Bilirubin: 0.3 mg/dL (ref 0.3–1.2)
Total Protein: 7.9 g/dL (ref 6.5–8.1)

## 2018-06-25 LAB — CBC WITH DIFFERENTIAL (CANCER CENTER ONLY)
Abs Immature Granulocytes: 0.01 10*3/uL (ref 0.00–0.07)
Basophils Absolute: 0 10*3/uL (ref 0.0–0.1)
Basophils Relative: 1 %
Eosinophils Absolute: 0 10*3/uL (ref 0.0–0.5)
Eosinophils Relative: 0 %
HCT: 34.8 % — ABNORMAL LOW (ref 36.0–46.0)
Hemoglobin: 11.3 g/dL — ABNORMAL LOW (ref 12.0–15.0)
IMMATURE GRANULOCYTES: 0 %
Lymphocytes Relative: 24 %
Lymphs Abs: 0.9 10*3/uL (ref 0.7–4.0)
MCH: 26.7 pg (ref 26.0–34.0)
MCHC: 32.5 g/dL (ref 30.0–36.0)
MCV: 82.3 fL (ref 80.0–100.0)
Monocytes Absolute: 0.4 10*3/uL (ref 0.1–1.0)
Monocytes Relative: 11 %
NEUTROS PCT: 64 %
Neutro Abs: 2.3 10*3/uL (ref 1.7–7.7)
PLATELETS: 280 10*3/uL (ref 150–400)
RBC: 4.23 MIL/uL (ref 3.87–5.11)
RDW: 16.3 % — ABNORMAL HIGH (ref 11.5–15.5)
WBC Count: 3.6 10*3/uL — ABNORMAL LOW (ref 4.0–10.5)
nRBC: 0 % (ref 0.0–0.2)

## 2018-06-25 LAB — MAGNESIUM: Magnesium: 1.8 mg/dL (ref 1.7–2.4)

## 2018-06-25 LAB — LIPASE, BLOOD: Lipase: 28 U/L (ref 11–51)

## 2018-06-25 LAB — URIC ACID: Uric Acid, Serum: 4 mg/dL (ref 2.5–7.1)

## 2018-06-25 LAB — BILIRUBIN, DIRECT: Bilirubin, Direct: <0.1 mg/dL (ref 0.0–0.2)

## 2018-06-25 LAB — LACTATE DEHYDROGENASE: LDH: 185 U/L (ref 98–192)

## 2018-06-25 LAB — AMYLASE: Amylase: 67 U/L (ref 28–100)

## 2018-06-25 LAB — PHOSPHORUS: Phosphorus: 3.8 mg/dL (ref 2.5–4.6)

## 2018-06-25 LAB — GAMMA GT: GGT: 26 U/L (ref 7–50)

## 2018-06-25 NOTE — Progress Notes (Signed)
Patient into clinic by herself this morning to have Labs drawn prior to [place].   Met with patient briefly before her lab appointment and she confirmed she has fasted for at least 8 hrs, drinking water only.  Patient sounds less congested today and states she is feeling much better with her upper respiratory symptoms improving.  Patient confirmed her study visit for Monday 06/28/18 at 8:30 am.  Reminded patient to not take her Ribociclib or Letrozole at home and to bring them with her to take in clinic on Monday after the ECG.  Also reminded patient to bring her medication diary.  She verbalized understanding.   Thanked patient for her time today. Foye Spurling, BSN, RN Clinical Research Nurse 06/25/2018 1:15 PM

## 2018-06-28 ENCOUNTER — Encounter: Payer: Self-pay | Admitting: *Deleted

## 2018-06-28 ENCOUNTER — Ambulatory Visit (HOSPITAL_COMMUNITY)
Admission: RE | Admit: 2018-06-28 | Discharge: 2018-06-28 | Disposition: A | Discharge status: Home / Self Care | Payer: 59 | Source: Ambulatory Visit | Attending: Adult Health | Admitting: Adult Health

## 2018-06-28 ENCOUNTER — Inpatient Hospital Stay: Payer: 59

## 2018-06-28 ENCOUNTER — Encounter: Payer: Self-pay | Admitting: Adult Health

## 2018-06-28 ENCOUNTER — Other Ambulatory Visit: Payer: Self-pay

## 2018-06-28 ENCOUNTER — Inpatient Hospital Stay: Payer: 59 | Admitting: Adult Health

## 2018-06-28 VITALS — BP 142/79 | HR 84 | Temp 97.8°F | Resp 18 | Ht 66.0 in | Wt 260.4 lb

## 2018-06-28 DIAGNOSIS — Z79811 Long term (current) use of aromatase inhibitors: Secondary | ICD-10-CM

## 2018-06-28 DIAGNOSIS — Z170 Estrogen receptor positive status [ER+]: Secondary | ICD-10-CM

## 2018-06-28 DIAGNOSIS — R300 Dysuria: Secondary | ICD-10-CM | POA: Diagnosis not present

## 2018-06-28 DIAGNOSIS — Z5111 Encounter for antineoplastic chemotherapy: Secondary | ICD-10-CM | POA: Diagnosis not present

## 2018-06-28 DIAGNOSIS — R9431 Abnormal electrocardiogram [ECG] [EKG]: Secondary | ICD-10-CM

## 2018-06-28 DIAGNOSIS — C50812 Malignant neoplasm of overlapping sites of left female breast: Secondary | ICD-10-CM

## 2018-06-28 DIAGNOSIS — C773 Secondary and unspecified malignant neoplasm of axilla and upper limb lymph nodes: Secondary | ICD-10-CM | POA: Diagnosis not present

## 2018-06-28 DIAGNOSIS — R053 Chronic cough: Secondary | ICD-10-CM

## 2018-06-28 DIAGNOSIS — Z006 Encounter for examination for normal comparison and control in clinical research program: Secondary | ICD-10-CM | POA: Diagnosis not present

## 2018-06-28 DIAGNOSIS — R05 Cough: Secondary | ICD-10-CM

## 2018-06-28 DIAGNOSIS — Z9221 Personal history of antineoplastic chemotherapy: Secondary | ICD-10-CM

## 2018-06-28 DIAGNOSIS — C50912 Malignant neoplasm of unspecified site of left female breast: Secondary | ICD-10-CM

## 2018-06-28 DIAGNOSIS — I252 Old myocardial infarction: Secondary | ICD-10-CM | POA: Diagnosis not present

## 2018-06-28 DIAGNOSIS — C50212 Malignant neoplasm of upper-inner quadrant of left female breast: Secondary | ICD-10-CM

## 2018-06-28 DIAGNOSIS — Z9012 Acquired absence of left breast and nipple: Secondary | ICD-10-CM

## 2018-06-28 DIAGNOSIS — Z923 Personal history of irradiation: Secondary | ICD-10-CM

## 2018-06-28 LAB — URINALYSIS, COMPLETE (UACMP) WITH MICROSCOPIC
Bilirubin Urine: NEGATIVE
Glucose, UA: NEGATIVE mg/dL
KETONES UR: NEGATIVE mg/dL
LEUKOCYTES UA: NEGATIVE
Nitrite: NEGATIVE
PROTEIN: NEGATIVE mg/dL
Specific Gravity, Urine: 1.006 (ref 1.005–1.030)
pH: 7 (ref 5.0–8.0)

## 2018-06-28 MED ORDER — GOSERELIN ACETATE 3.6 MG ~~LOC~~ IMPL
3.6000 mg | DRUG_IMPLANT | Freq: Once | SUBCUTANEOUS | Status: AC
  Administered 2018-06-28: 3.6 mg via SUBCUTANEOUS

## 2018-06-28 MED ORDER — GOSERELIN ACETATE 3.6 MG ~~LOC~~ IMPL
DRUG_IMPLANT | SUBCUTANEOUS | Status: AC
  Filled 2018-06-28: qty 3.6

## 2018-06-28 NOTE — Research (Signed)
C2D1OF [name]: Patient into clinic by herself this morning to complete study activities.  VS: Completed per protocol. BP taken after patient resting x 5 minutes and weight obtained without shoes or heavy clothing.  Con Meds: Reviewed with patient. Medication list is up to date. Reminded patient to notify research nurse of any new medications prior to taking them unless it is an emergency. She verbalized understanding.  History & Physical: Completed by [name], NP.  AEs: Reviewed with patient by this research RN and [name]. See AE table below. Pre-dose ECG: Study ECG done at 9:18 am shows QTcF = 472m. 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 GLindi Adiereviewed and states ok to start Ribociclib Cycle 2 today on study as planned. Dr. GLindi Adiealso ordered a clinic ECG to be done for patient's records and possible cardiology consult.  Clinic ECG done and shows NSR with no abnormalities reported.  Dr. GLindi Adiereviewed clinic ECG and states no need for cardiology consult or other interventions at this time.   Con Meds: Reviewed with patient and medication list updated. Reminded patient to notify research nurse of any new medications prior to taking them unless it is an emergency. She verbalized understanding.  Study Drug:Patient states she has not missed any doses of Ribociclib or Letrozole this cycle. Patient returned completed medication diary for Cycle 1 in which she documented no missed doses of Ribociclib or Letrozole. Patient returned bottle of Ribociclib [id number] 33 pills left in bottle, which is the expected number of pills remaining for patient taking 2 daily for 21 days this cycle. IRT assigned kit# [id number] cycle 2 Ribociclib.  Dispensed by [facility] Pharmacist. Double checked patients name, DOB, Study ID #, Kit#  and expiration date.  Ribociclib then given to patient along  with medication diary for cycle 2 Ribociclib and Letrozole. Patient took dose of Ribociclib and Letrozole in clinic at 10:20 AM and documented in medication diary.  AE Table: Event Grade Onset Date End Date Status Comments Attribution to Ribociclib Attribution to Letrozole Attribution to Goserelin  Allergic Rhinitis 2 2012  ongoing At Baseline n/a n/a n/a  Anemia 1 2018  ongoing At Baseline n/a n/a n/a  Arthralgias, legs 1 12/2017  ongoing At Baseline n/a n/a n/a  Constipation 1 08/27/17  ongoing At Baseline n/a n/a n/a  Cough, intermittent 2 05/09/18  ongoing At Baseline n/a n/a n/a  GERD 1 2018  intermittent At Baseline n/a n/a n/a  Gluten Sensitivity 1 12/2017  ongoing At Baseline n/a n/a n/a  Headaches 1 02/2017  ongoing At Baseline n/a n/a n/a  Hot Flashes 2 03/2017  ongoing At Baseline n/a n/a n/a  Hyperglycemia 1 2018  ongoing At Baseline n/a n/a n/a  Hyperlipidemia 1 03/2016  ongoing At Baseline n/a n/a n/a  Hypertension, intermitent 2 2012 06/14/18 Decreased to grade 1 At Baseline n/a n/a n/a  Increased LDH 1 05/18/18 06/11/18 resolved At Baseline n/a n/a n/a  Vitamin D Deficiency 2 05/2015  ongoing At Baseline n/a n/a n/a  Hypertension, intermittent 1 06/14/18  decreased Grade 2 at baseline n/a n/a n/a  Nasal Congestion 2 06/11/18  ongoing New No No No  Diarrhea 1 06/26/18 06/26/18 resolved  New Yes No No  Dysuria 1 06/26/18 06/27/18 resolved  New No No No  Lower back/flank pain 1 06/27/18  ongoing  New No No No  Fatigue 1 06/28/18  ongoing  New Yes  No No   Thanked patient for her participation today.  Reviewed schedule for next cycle with fasting lab on Friday 1/31 and study visit on Monday 2/3 for C2D15.  Informed patient of 2 ECGs on 2/3, one pre dose and one 2 hrs post dose. Instructed patient to call for any questions or concerns prior to next visit. She verbalized understanding.   CXR and U/A results called to patient and instructed patient per [name], no  interventions required at this time. Will recheck U/A on next visit.  Will call patient back if urine culture is positive, otherwise instructed patient to contact call if her symptoms worsen.  She verbalized understanding.   Foye Spurling, BSN, RN Clinical Research Nurse 06/28/2018 3:59 PM

## 2018-06-28 NOTE — Progress Notes (Signed)
[place]  [phone] [phone]) [phone]     ID: Mackenzie [name] DOB: 10/15/1970  MR#: [id number]  ZGY#:[id number]  Patient Care Team: Willey Blade, MD as PCP - General (Internal Medicine) Magrinat, [name], MD as Consulting Physician (Oncology) Erroll Luna, MD as Consulting Physician (General Surgery) Kyung Rudd, MD as Consulting Physician (Radiation Oncology) Dillingham, Loel Lofty, DO as Attending Physician (Plastic Surgery) Cathlean Cower, RN as Registered Nurse OTHER MD:  CHIEF COMPLAINT: Estrogen receptor positive breast cancer  CURRENT TREATMENT: letrozole, goserelin; on [name] trial   HISTORY OF CURRENT ILLNESS: From the original intake note:  The patient herself noted some changes in her left breast late July 2018, she says, and eventually brought this to medical attention so that on 01/09/2017 she underwent bilateral diagnostic mammography with tomography and left breast ultrasonography at the breast Center. This found the breast density to be category C. In the upper inner quadrant of the left breast there was an area of asymmetry and there were malignant type calcifications involving all 4 quadrants. On exam there is firmness and palpable thickening in the anterior left breast with skin dimpling. Ultrasonography found at the 9:30 o'clock radiant 3 cm from the nipple a 2.7 cm mass and in the left axilla for abnormal lymph nodes largest of which measured 2.7 cm.  Biopsy of the left breast 9:30 o'clock mass 80 01/26/2017 showed ([id number]) invasive ductal carcinoma, with extracellular mucin, grade 1 or 2. In the lower outer left breast is separated biopsy the same day showed ductal carcinoma in situ. One of the 4 lymph nodes involved was positive for metastatic carcinoma. Prognostic panel on the invasive disease showed it to be estrogen receptor 100% positive, progesterone receptor 20% positive, both with strong staining intensity,  with an MIB-1 of 20%, and no HER-2 amplification with a signals ratio 1.38 and the number per cell 2.90.  On 01/22/2017 the patient underwent bilateral breast MRI. This showed no involvement of the right breast, but in the left breast there was a masslike and non-masslike enhancement involving all quadrants, with skin swelling but no abnormal enhancement of the skin or nipple areolar complex or pectoralis muscle. The mass could not be clearly measured but spanned approximately 12 cm. There was bulky left axillary lymphadenopathy, with the largest lymph node measuring up to 3.4 cm.  The patient's subsequent history is as detailed below.  INTERVAL HISTORY: Mackenzie [name] returns today for follow-up of her estrogen receptor positive breast cancer. She is here to start cycle 2 day 1 of the [name] clinical trial.    The patient continues on letrozole, which she is tolerating well. She denies hot flashes, arthralgias, vaginal dryness.    She also receives Goserelin every 4 weeks which she is tolerating well.    She starts on Ribociclib today.  She notes some mild fatigue with this.  She is working on increasing her exercise, which helps.      REVIEW OF SYSTEMS: Mackenzie [name] is feeling moderately well.  She has had a waxing and waning cough and nasal congestion for a couple of months.  It was previously getting worse, and now it is getting better.  She is taking OTC Mucinex and Sudafed for this.  She also notes some dysuria x 2 days over the weekend that has resolved, however this morning she had some mild lower back discomfort and wonders if this may be related.  She also had one episode of loose stools over the weekend that have  since resolved.    Mackenzie [name] denies fevers, chills, unusual headaches or vision changes.  She isn't having chest pain, palpitations, or shortness of breath.  She denies nausea, vomiting, constipation.  A detailed ROS was otherwise non contributory.     PAST MEDICAL HISTORY: Past Medical  History:  Diagnosis Date  . Abnormal glucose 2018  . Allergic rhinitis 2012  . Anemia 01/27/2017   prior to starting chemotherapy  . Breast cancer ([facility]) 01/14/2017   Left breast  . Diabetes mellitus 2012   GDM  . GERD (gastroesophageal reflux disease) 2018  . Hot flashes 03/2017  . Hyperlipidemia 03/20/2016  . Hypertension 2012  . NCGS (non-celiac gluten sensitivity)   . Vitamin D deficiency 05/2015    PAST SURGICAL HISTORY: Past Surgical History:  Procedure Laterality Date  . BREAST RECONSTRUCTION WITH PLACEMENT OF TISSUE EXPANDER AND FLEX HD (ACELLULAR HYDRATED DERMIS) Left 08/27/2017   Procedure: LEFT BREAST RECONSTRUCTION WITH PLACEMENT OF TISSUE EXPANDER AND FLEX HD;  Surgeon: Wallace Going, DO;  Location: [facility];  Service: Plastics;  Laterality: Left;  . CESAREAN SECTION     x2  . MASTECTOMY MODIFIED RADICAL Left 08/27/2017  . MASTECTOMY MODIFIED RADICAL Left 08/27/2017   Procedure: LEFT MODIFIED RADICAL MASTECTOMY;  Surgeon: Erroll Luna, MD;  Location: [facility];  Service: General;  Laterality: Left;  . PORT-A-CATH REMOVAL Right 08/27/2017   Procedure: REMOVAL PORT-A-CATH RIGHT CHEST;  Surgeon: Erroll Luna, MD;  Location: [facility];  Service: General;  Laterality: Right;  . PORTACATH PLACEMENT Right 01/28/2017   Procedure: INSERTION PORT-A-CATH WITH ULTRASOUND;  Surgeon: Erroll Luna, MD;  Location: [facility];  Service: General;  Laterality: Right;  . TUBAL LIGATION Bilateral 01/21/2011    FAMILY HISTORY Family History  Problem Relation Age of Onset  . Breast cancer Paternal Grandmother 67       d.60s from breast cancer. Did not have treatment.  . Other Mother        P.37 from complications of surgery to remove brain tumor   The patient's father still alive at age 86. The patient's mother died with a brain tumor which the patient says was "benign". She was 56. The patient has one brother, no sisters. The only breast cancer in the family is a  paternal grandmother who died from breast cancer at an unknown age. There is no history of ovarian or prostate cancer in the family.   GYNECOLOGIC HISTORY:  No LMP recorded. (Menstrual status: Chemotherapy). Menarche age 89 and first live birth age 36 she is [place] P4. The person is still having regular periods as of August 2018   SOCIAL HISTORY: (Updated 05/31/2018) Mackenzie [name] works at [employer] on the fifth floor, [place]. She is separated from her husband (as of 05/2018), Mackenzie [name], who is a Building control surveyor. Daughter is married and lives in [place] and works as a Education administrator. Stasia's daughter, Mackenzie [name], lives in [place] and works in the police department. Daughters, [name] and [place] are 77 and 6, living at home. The patient has no grandchildren. She attends a [employer].     ADVANCED DIRECTIVES: Not in place   HEALTH MAINTENANCE: Social History   Tobacco Use  . Smoking status: Never Smoker  . Smokeless tobacco: Never Used  Substance Use Topics  . Alcohol use: Yes    Comment: social  . Drug use: No    Colonoscopy: Never  PAP: December 2017  Bone density: Never   No Known Allergies  Current Outpatient Medications  Medication Sig  Dispense Refill  . acetaminophen (TYLENOL) 500 MG tablet Take 1,000 mg by mouth every 6 (six) hours as needed (for pain/headaches.).     Marland Kitchen B Complex Vitamins (VITAMIN B COMPLEX PO) Take 1 capsule by mouth 2 (two) times daily.     . benzonatate (TESSALON) 100 MG capsule Take 100 mg by mouth every 8 (eight) hours as needed for cough.   0  . budesonide-formoterol (SYMBICORT) 80-4.5 MCG/ACT inhaler Inhale 2 puffs into the lungs 2 (two) times daily.     . Calcium Carb-Cholecalciferol (CALCIUM 600+D3 PO) Take 1 tablet by mouth 2 (two) times daily.     . cetirizine (ZYRTEC) 10 MG tablet Take 10 mg by mouth daily.     . Cholecalciferol (VITAMIN D-3) 5000 units TABS Take 5,000 Units by mouth 2 (two) times daily.     Marland Kitchen gabapentin (NEURONTIN) 300 MG  capsule Take 1 capsule (300 mg total) by mouth at bedtime. (Patient taking differently: Take 300 mg by mouth at bedtime. ) 90 capsule 4  . ibuprofen (ADVIL,MOTRIN) 200 MG tablet Take 800 mg by mouth every 8 (eight) hours as needed for mild pain (for pain.).     . Investigational ribociclib (KISQALI) 200 MG tablet [name] Take 2 tablets (400 mg total) by mouth daily. Take 2 tablets (478m total) by mouth daily on days 1-21.  Repeat every 28 days. 75 tablet 0  . letrozole (FEMARA) 2.5 MG tablet Take 1 tablet (2.5 mg total) by mouth daily. (Patient taking differently: Take 2.5 mg by mouth daily. ) 30 tablet 5  . loratadine (CLARITIN) 10 MG tablet Take 1 tablet (10 mg total) by mouth daily. (Patient taking differently: Take 10 mg by mouth daily as needed for allergies. ) 90 tablet 2  . montelukast (SINGULAIR) 10 MG tablet Take 10 mg by mouth daily as needed (for allergies.).     .Marland KitchenMultiple Vitamin (MULTIVITAMIN WITH MINERALS) TABS tablet Take 1 tablet by mouth daily.     . Multiple Vitamins-Minerals (EMERGEN-C IMMUNE PO) Take by mouth as needed.    . Multiple Vitamins-Minerals (ZINC PO) Take 1 tablet by mouth daily.     . Omega-3 Fatty Acids (FISH OIL) 1000 MG CAPS Take 1,000 mg by mouth 2 (two) times daily.     .Marland KitchenPhenylephrine-Pheniramine-DM (THERAFLU COLD & COUGH PO) Take by mouth as needed.    .Marland KitchenPREVIDENT 5000 BOOSTER PLUS 1.1 % PSTE 2 (two) times daily.   2  . Probiotic Product (PROBIOTIC DAILY PO) Take by mouth 2 (two) times daily.     .Marland Kitchensenna (SENOKOT) 8.6 MG TABS tablet Take 1 tablet (8.6 mg total) by mouth 2 (two) times daily. (Patient taking differently: Take 1 tablet by mouth 2 (two) times daily. Patient takes prn) 120 each 0   No current facility-administered medications for this visit.    Facility-Administered Medications Ordered in Other Visits  Medication Dose Route Frequency Provider Last Rate Last Dose  . sodium chloride flush (NS) 0.9 % injection 10 mL  10 mL Intravenous PRN  Magrinat, GVirgie Dad MD   10 mL at 03/10/17 1239    OBJECTIVE:  Vitals:   06/28/18 0833  BP: (!) 142/79  Pulse: 84  Resp: 18  Temp: 97.8 F (36.6 C)  SpO2: 100%     Body mass index is 42.03 kg/m.   Wt Readings from Last 3 Encounters:  06/28/18 260 lb 6.4 oz (118.1 kg)  06/14/18 263 lb 4.8 oz (119.4 kg)  05/31/18 263 lb  11.2 oz (119.6 kg)   ECOG FS: 1 - Symptomatic but completely ambulatory GENERAL: Patient is a well appearing female in no acute distress HEENT:  Sclerae anicteric.  Oropharynx clear and moist. No ulcerations or evidence of oropharyngeal candidiasis. Neck is supple.  NODES:  No cervical, supraclavicular, or axillary lymphadenopathy palpated.  BREAST EXAM:  Right breast benign, left breast s/p mastectomy and radiation, no sign of local recurrence LUNGS:  Clear to auscultation bilaterally.  No wheezes or rhonchi. HEART:  Regular rate and rhythm. No murmur appreciated. ABDOMEN:  Soft, nontender.  Positive, normoactive bowel sounds. No organomegaly palpated. MSK:  No focal spinal tenderness to palpation. Full range of motion bilaterally in the upper extremities. EXTREMITIES:  No peripheral edema.   SKIN:  Clear with no obvious rashes or skin changes. No nail dyscrasia. NEURO:  Nonfocal. Well oriented.  Appropriate affect.    LAB RESULTS:    No visits with results within 3 Day(s) from this visit.  Latest known visit with results is:  Appointment on 06/25/2018  Component Date Value Ref Range Status  . WBC Count 06/25/2018 3.6* 4.0 - 10.5 K/uL Final  . RBC 06/25/2018 4.23  3.87 - 5.11 MIL/uL Final  . Hemoglobin 06/25/2018 11.3* 12.0 - 15.0 g/dL Final  . HCT 06/25/2018 34.8* 36.0 - 46.0 % Final  . MCV 06/25/2018 82.3  80.0 - 100.0 fL Final  . MCH 06/25/2018 26.7  26.0 - 34.0 pg Final  . MCHC 06/25/2018 32.5  30.0 - 36.0 g/dL Final  . RDW 06/25/2018 16.3* 11.5 - 15.5 % Final  . Platelet Count 06/25/2018 280  150 - 400 K/uL Final  . nRBC 06/25/2018 0.0  0.0 - 0.2  % Final  . Neutrophils Relative % 06/25/2018 64  % Final  . Neutro Abs 06/25/2018 2.3  1.7 - 7.7 K/uL Final  . Lymphocytes Relative 06/25/2018 24  % Final  . Lymphs Abs 06/25/2018 0.9  0.7 - 4.0 K/uL Final  . Monocytes Relative 06/25/2018 11  % Final  . Monocytes Absolute 06/25/2018 0.4  0.1 - 1.0 K/uL Final  . Eosinophils Relative 06/25/2018 0  % Final  . Eosinophils Absolute 06/25/2018 0.0  0.0 - 0.5 K/uL Final  . Basophils Relative 06/25/2018 1  % Final  . Basophils Absolute 06/25/2018 0.0  0.0 - 0.1 K/uL Final  . Immature Granulocytes 06/25/2018 0  % Final  . Abs Immature Granulocytes 06/25/2018 0.01  0.00 - 0.07 K/uL Final   Performed at [facility], [place] [address], [place], [place] [address]  . Sodium 06/25/2018 138  135 - 145 mmol/L Final  . Potassium 06/25/2018 3.7  3.5 - 5.1 mmol/L Final  . Chloride 06/25/2018 103  98 - 111 mmol/L Final  . CO2 06/25/2018 27  22 - 32 mmol/L Final  . Glucose, Bld 06/25/2018 120* 70 - 99 mg/dL Final  . BUN 06/25/2018 14  6 - 20 mg/dL Final  . Creatinine 06/25/2018 0.93  0.44 - 1.00 mg/dL Final  . Calcium 06/25/2018 9.4  8.9 - 10.3 mg/dL Final  . Total Protein 06/25/2018 7.9  6.5 - 8.1 g/dL Final  . Albumin 06/25/2018 3.8  3.5 - 5.0 g/dL Final  . AST 06/25/2018 15  15 - 41 U/L Final  . ALT 06/25/2018 19  0 - 44 U/L Final  . Alkaline Phosphatase 06/25/2018 129* 38 - 126 U/L Final  . Total Bilirubin 06/25/2018 0.3  0.3 - 1.2 mg/dL Final  . GFR, Est Non Af  Am 06/25/2018 >60  >60 mL/min Final  . GFR, Est AFR Am 06/25/2018 >60  >60 mL/min Final  . Anion gap 06/25/2018 8  5 - 15 Final   Performed at [facility], [place] [address] [place], [place] [address]  . Uric Acid, Serum 06/25/2018 4.0  2.5 - 7.1 mg/dL Final   Performed at [facility], [place] [address] [place], [place] [address]  . Phosphorus 06/25/2018 3.8  2.5 - 4.6 mg/dL Final   Performed at [place] [address] [place], [place] [address]  . Magnesium 06/25/2018 1.8  1.7 - 2.4 mg/dL Final   Performed at [facility], [place] [address], [place], [place] [address]  . Lipase 06/25/2018 28  11 - 51 U/L Final   Performed at [facility], [place] [address] [place], [place] [address]  . LDH 06/25/2018 185  98 - 192 U/L Final   Performed at [facility], [place] [address], [place], [place] [address]  . GGT 06/25/2018 26  7 - 50 U/L Final   Performed at [facility], [place] [address] [place], [place] [address]  . Bilirubin, Direct 06/25/2018 <0.1  0.0 - 0.2 mg/dL Final   Performed at [place] [address], [place], [place] [address]  . Amylase 06/25/2018 67  28 - 100 U/L Final   Performed at [facility], [place] [address] [place], [place] [address]     STUDIES: Electrocardiogram in process  ELIGIBLE FOR AVAILABLE RESEARCH PROTOCOL: [name], ASA study   ASSESSMENT: 48 y.o. [place] woman status post left breast upper inner quadrant biopsy 01/14/2017 for a clinical T3 N2, stage IIA invasive ductal carcinoma, grade 1 or 2, estrogen and progesterone receptor positive, HER-2 nonamplified, with an MIB-1 of 20%.  (1) staging studies: Brain MRI, bone scan, and CT scan of the chest 02/05/2017 showed no brain lesions, no lung or liver lesions, a 4.9 cm mass in the left breast with left axillary and subpectoral adenopathy, and nonspecific bone scan tracer at L2, left scapula, and anterior ribs, with lumbar spine MRI suggested for further evaluation.  (a) lumbar spine MRI 02/17/2017 showed no normal bone lesions.  There was mild lumbar spondylosis  (2) neoadjuvant chemotherapy consisting of cyclophosphamide and doxorubicin in dose dense fashion 4 started 02/10/2017, completed 03/24/2017, followed by weekly carboplatin and gemcitabine given days 1 and 8 of each 21-day cycle starting  04/14/2017, completing the planned 4 cycles 06/26/2017  (3) is post left modified radical mastectomy on 08/27/2017 showing an mpT3 pN2 residual invasive ductal carcinoma, grade 2, with a residual cancer burden of 3.  Margins were clear  (a) TRAM flap reconstruction planned  (4) postmastectomy radiation completed 01/01/2018  (a) capecitabine radiosensitization 11/16/2017-01/01/2018  (5) goserelin started 09/21/2017  (a) letrozole started 01/11/2018  (b) enrolled in [place] clinical trial, randomized to Ribociclib on 05/31/2018  (6) genetics testing 03/24/2017 through the Common Hereditary Cancer Panel offered by [facility] found no deleterious mutations in APC, ATM, AXIN2, BARD1, BMPR1A, BRCA1, BRCA2, BRIP1, CDH1, CDKN2A (p14ARF), CDKN2A (p16INK4a), CHEK2, CTNNA1, DICER1, EPCAM (Deletion/duplication testing only), GREM1 (promoter region deletion/duplication testing only), KIT, MEN1, MLH1, MSH2, MSH3, MSH6, MUTYH, NBN, NF1, NHTL1, PALB2, PDGFRA, PMS2, POLD1, POLE, PTEN, RAD50, RAD51C, RAD51D, SDHB, SDHC, SDHD, SMAD4, SMARCA4. STK11, TP53, TSC1, TSC2, and VHL.  The following genes were evaluated for sequence changes only: SDHA and HOXB13 c.251G>A variant only.  PLAN: Mackenzie [name] is doing well today.  She has no clinical sign of breast cancer recurrence.  She is tolerating her treatment well.  Her labs were reviewed in detail and are normal.  She will continue Letrozole and Goserelin.  She will proceed with cycle 2 of Ribociclib as per [name] clinical trial.  Dr. Lindi Adie reviewed Mackenzie [name]'s EKG.  He has cleared her for treatment, but recommends evaluation by cardiology consultation since the EKG questions previous Myocardial infarction.    In regards to the waxing and waning cough, we will evaluate with a chest xray.  We will also get a urinalysis to evaluate her dysuria over the weekend.  We will call her with these results once we get them.    Mackenzie [name] will return on 07/09/2018 and 07/23/2018 for labs only  and on 07/26/2018 for f/u with Dr. Jana Hakim.  She knows to call for any problem that may develop before the next visit.    A total of (30) minutes of face-to-face time was spent with this patient with greater than 50% of that time in counseling and care-coordination.   [name], NP 06/28/18 9:24 AM Medical Oncology and Hematology [facility] [address] [place], [place] [address] Tel. [phone]    Fax. [phone]

## 2018-06-29 LAB — URINE CULTURE: Culture: <10000 — AB

## 2018-06-30 ENCOUNTER — Encounter: Payer: Self-pay | Admitting: Oncology

## 2018-07-01 MED ORDER — INV-RIBOCICLIB 200 MG TAB NATALEE TRIO003 STUDY: 400.0000 mg | ORAL_TABLET | Freq: Every day | ORAL | 0 refills | Status: DC

## 2018-07-02 MED FILL — SYMBICORT 80-4.5 MCG INH: 80-4.5 | 30 days supply | Qty: 10 | Fill #1

## 2018-07-08 ENCOUNTER — Telehealth: Payer: Self-pay | Admitting: *Deleted

## 2018-07-08 NOTE — Telephone Encounter (Signed)
Called patient and LVM to remind her to fast for 8 hrs prior to lab appointment tomorrow morning at 9 am.  Informed her I will be out of the office tomorrow and my co-worker, Otilio Miu, will be reviewing her lab results to make sure it is ok to continue on the Ribociclib.  Asked patient to wait for her call after CBC and CMET result to take her Ribociclib tomorrow.  Also informed patient that [name] had ordered a repeat U/A for tomorrow, but please call if she feels her urinary symptoms are better and this test might not be necessary.   Reminded patient about her appointment with this research RN on Monday 2/3 at 9 am and to bring her meds to take in clinic that day after ECG.   Asked patient to call back if any questions.  Foye Spurling, BSN, RN Clinical Research Nurse 07/08/2018 10:11 AM

## 2018-07-09 ENCOUNTER — Inpatient Hospital Stay: Payer: 59

## 2018-07-09 ENCOUNTER — Telehealth: Payer: Self-pay | Admitting: Medical Oncology

## 2018-07-09 DIAGNOSIS — C773 Secondary and unspecified malignant neoplasm of axilla and upper limb lymph nodes: Principal | ICD-10-CM

## 2018-07-09 DIAGNOSIS — C50912 Malignant neoplasm of unspecified site of left female breast: Secondary | ICD-10-CM

## 2018-07-09 LAB — URINALYSIS, COMPLETE (UACMP) WITH MICROSCOPIC
Bacteria, UA: NONE SEEN
Bilirubin Urine: NEGATIVE
Glucose, UA: NEGATIVE mg/dL
Ketones, ur: NEGATIVE mg/dL
Leukocytes, UA: NEGATIVE
Nitrite: NEGATIVE
Protein, ur: NEGATIVE mg/dL
Specific Gravity, Urine: 1.002 — ABNORMAL LOW (ref 1.005–1.030)
pH: 7 (ref 5.0–8.0)

## 2018-07-09 LAB — CBC WITH DIFFERENTIAL (CANCER CENTER ONLY)
Abs Immature Granulocytes: 0.01 10*3/uL (ref 0.00–0.07)
Basophils Absolute: 0 10*3/uL (ref 0.0–0.1)
Basophils Relative: 1 %
EOS PCT: 1 %
Eosinophils Absolute: 0 10*3/uL (ref 0.0–0.5)
HCT: 36.8 % (ref 36.0–46.0)
Hemoglobin: 11.8 g/dL — ABNORMAL LOW (ref 12.0–15.0)
Immature Granulocytes: 0 %
Lymphocytes Relative: 19 %
Lymphs Abs: 0.8 10*3/uL (ref 0.7–4.0)
MCH: 26.5 pg (ref 26.0–34.0)
MCHC: 32.1 g/dL (ref 30.0–36.0)
MCV: 82.7 fL (ref 80.0–100.0)
MONO ABS: 0.3 10*3/uL (ref 0.1–1.0)
Monocytes Relative: 8 %
Neutro Abs: 2.9 10*3/uL (ref 1.7–7.7)
Neutrophils Relative %: 71 %
Platelet Count: 352 10*3/uL (ref 150–400)
RBC: 4.45 MIL/uL (ref 3.87–5.11)
RDW: 15.9 % — ABNORMAL HIGH (ref 11.5–15.5)
WBC Count: 4 10*3/uL (ref 4.0–10.5)
nRBC: 0 % (ref 0.0–0.2)

## 2018-07-09 LAB — CMP (CANCER CENTER ONLY)
ALT: 19 U/L (ref 0–44)
AST: 15 U/L (ref 15–41)
Albumin: 4.1 g/dL (ref 3.5–5.0)
Alkaline Phosphatase: 149 U/L — ABNORMAL HIGH (ref 38–126)
Anion gap: 9 (ref 5–15)
BUN: 12 mg/dL (ref 6–20)
CO2: 25 mmol/L (ref 22–32)
CREATININE: 1.03 mg/dL — AB (ref 0.44–1.00)
Calcium: 9.9 mg/dL (ref 8.9–10.3)
Chloride: 102 mmol/L (ref 98–111)
GFR, Estimated: >60 mL/min (ref >60)
Glucose, Bld: 113 mg/dL — ABNORMAL HIGH (ref 70–99)
Potassium: 3.8 mmol/L (ref 3.5–5.1)
Sodium: 136 mmol/L (ref 135–145)
Total Bilirubin: 0.4 mg/dL (ref 0.3–1.2)
Total Protein: 8.2 g/dL — ABNORMAL HIGH (ref 6.5–8.1)

## 2018-07-09 LAB — BILIRUBIN, DIRECT: BILIRUBIN DIRECT: 0.1 mg/dL (ref 0.0–0.2)

## 2018-07-09 LAB — GAMMA GT: GGT: 24 U/L (ref 7–50)

## 2018-07-09 LAB — URIC ACID: Uric Acid, Serum: 4 mg/dL (ref 2.5–7.1)

## 2018-07-09 LAB — AMYLASE: Amylase: 58 U/L (ref 28–100)

## 2018-07-09 LAB — PHOSPHORUS: Phosphorus: 4 mg/dL (ref 2.5–4.6)

## 2018-07-09 LAB — MAGNESIUM: Magnesium: 1.9 mg/dL (ref 1.7–2.4)

## 2018-07-09 LAB — LACTATE DEHYDROGENASE: LDH: 201 U/L — ABNORMAL HIGH (ref 98–192)

## 2018-07-09 LAB — LIPASE, BLOOD: Lipase: 30 U/L (ref 11–51)

## 2018-07-09 NOTE — Telephone Encounter (Signed)
[name] LVMOM with patient informing her of her CBC, CMET, Magnesium and UA results. I informed patient that results are within parameters for her to continue with taking Ribociclib and completing this cycle. Patient was encouraged to return call should she have worsening urinary symptoms, otherwise her research nurse, Foye Spurling will see her for patient's scheduled appointment on Monday.  My contact information provided, patient thanked and was encouraged to call with questions. Maxwell [place], RN, BSN, [facility] Clinical Research 07/09/2018 10:08 AM

## 2018-07-12 ENCOUNTER — Inpatient Hospital Stay: Payer: 59 | Attending: Adult Health | Admitting: *Deleted

## 2018-07-12 VITALS — BP 122/75 | HR 67 | Temp 97.9°F | Ht 64.75 in | Wt 254.7 lb

## 2018-07-12 DIAGNOSIS — Z170 Estrogen receptor positive status [ER+]: Secondary | ICD-10-CM | POA: Insufficient documentation

## 2018-07-12 DIAGNOSIS — C773 Secondary and unspecified malignant neoplasm of axilla and upper limb lymph nodes: Secondary | ICD-10-CM | POA: Insufficient documentation

## 2018-07-12 DIAGNOSIS — H538 Other visual disturbances: Secondary | ICD-10-CM | POA: Insufficient documentation

## 2018-07-12 DIAGNOSIS — C50912 Malignant neoplasm of unspecified site of left female breast: Secondary | ICD-10-CM

## 2018-07-12 DIAGNOSIS — Z923 Personal history of irradiation: Secondary | ICD-10-CM | POA: Insufficient documentation

## 2018-07-12 DIAGNOSIS — Z9221 Personal history of antineoplastic chemotherapy: Secondary | ICD-10-CM | POA: Insufficient documentation

## 2018-07-12 DIAGNOSIS — C50812 Malignant neoplasm of overlapping sites of left female breast: Secondary | ICD-10-CM | POA: Insufficient documentation

## 2018-07-12 DIAGNOSIS — Z79811 Long term (current) use of aromatase inhibitors: Secondary | ICD-10-CM | POA: Insufficient documentation

## 2018-07-12 DIAGNOSIS — Z9012 Acquired absence of left breast and nipple: Secondary | ICD-10-CM | POA: Insufficient documentation

## 2018-07-12 DIAGNOSIS — Z5111 Encounter for antineoplastic chemotherapy: Secondary | ICD-10-CM | POA: Insufficient documentation

## 2018-07-12 DIAGNOSIS — E119 Type 2 diabetes mellitus without complications: Secondary | ICD-10-CM | POA: Insufficient documentation

## 2018-07-12 DIAGNOSIS — Z006 Encounter for examination for normal comparison and control in clinical research program: Secondary | ICD-10-CM | POA: Insufficient documentation

## 2018-07-12 NOTE — Research (Signed)
[id number] Mackenzie [name] STUDY: Patient into clinic by herself this morning to complete study activities.  VS: Completed per protocol.  Con Meds: Reviewed with patient. Medication list is up to date. Reminded patient to notify research nurse of any new medications prior to taking them unless it is an emergency. Remember to show her study card to other prescribing providers as needed. She verbalized understanding.  AEs: Reviewed with patient. See AE table below. Patient reports lower mid back pain this morning upon getting out of bed. She took tylenol for the pain and it has not interfered with any of her usual activities. States lower back/flank pain from last visit has not returned. She denies any urinary symptoms.  Dr. Jana Hakim reviewed u/a results and states they are clinically insignificant.  Patient to report if she has any urinary symptoms or worsening back pain. Patient verbalized understanding.  Upper respiratory congestion and cough are improving but patient still taking OTC cold and cough medication PRN per med list.   Pre-dose ECG: Study ECG done at 9:17 am shows QTcF = 415ms. Dr. Jana Hakim reviewed and states ok to continue Ribociclib Cycle 2, D15 today on study as planned.  No interventions required.  Post-dose ECG: Study ECG done 2 hrs post dose at 11:27 am shows QTcF = 482ms. Dr. Jana Hakim reviewed and states no interventions required.    Study Drug:Patient states she has not missed any doses of Ribociclib or Letrozole so far this cycle. She forgot to bring her diary with her for today's visit. Patient took dose of Ribociclib and Letrozole in clinic at 9:27 am and states will document in medication diary today. She made a note of it in her cell phone.Pill count for Ribocilib after today's dose is 45 pills which is the expected number of pills remaining for patient not missing any doses so far this cycle.   AE Table: Event Grade Onset Date End Date Status Comments Attribution to Ribociclib  Attribution to Letrozole Attribution to Goserelin  Allergic Rhinitis 2 2012  ongoing At Baseline n/a n/a n/a  Anemia 1 2018  ongoing At Baseline n/a n/a n/a  Arthralgias, legs 1 12/2017  ongoing At Baseline n/a n/a n/a  Constipation 1 08/27/17  ongoing At Baseline n/a n/a n/a  Cough, intermittent 2 05/09/18  ongoing At Baseline n/a n/a n/a  GERD 1 2018  intermittent At Baseline n/a n/a n/a  Gluten Sensitivity 1 12/2017  ongoing At Baseline n/a n/a n/a  Headaches 1 02/2017  ongoing At Baseline n/a n/a n/a  Hot Flashes 2 03/2017  ongoing At Baseline n/a n/a n/a  Hyperglycemia 1 2018  ongoing At Baseline n/a n/a n/a  Hyperlipidemia 1 03/2016  ongoing At Baseline n/a n/a n/a  Vitamin D Deficiency 2 05/2015  ongoing At Baseline n/a n/a n/a  Hypertension, intermittent 1 06/14/18  intermittent Grade 2 at baseline n/a n/a n/a  Nasal Congestion 2 06/11/18  ongoing  No No No  Lower back pain 1 07/12/18  ongoing  New No No No  Lower back/flank pain 1 06/27/18 06/27/18 resolved   No No No  Fatigue 1 06/28/18  ongoing  Yes No No   Thanked patient for her participation today.  Reviewed schedule for next cycle with fasting lab on Friday 2/14 and study visit on Monday 2/17 for C3D1.  Patient states she needs to work on Monday 2/17 and cannot make it that day. She can come in the following day on Tuesday 2/18.  Asked patient if she can come  in for Lab only on Monday morning 2/17 and she said she could do that before work. Informed patient she will miss the first dose of next cycle and she understands but cannot adjust her work schedule. Will call patient with new schedule.   Instructed patient to call for any questions or concerns prior to next visit. She verbalized understanding. Foye Spurling, BSN, RN Clinical Research Nurse 07/12/2018 11:45 AM

## 2018-07-14 ENCOUNTER — Telehealth: Payer: Self-pay | Admitting: *Deleted

## 2018-07-14 NOTE — Telephone Encounter (Signed)
Called patient to inform her of next research appointments moved to Lab on 2/17 at 9am and study visit with Mendel Ryder and Injection on 2/18 at 9 am.  Will call patient on Friday 2/14 to remind her to fast for lab on 2/17.  She verbalized understanding.  Foye Spurling, BSN, RN Clinical Research Nurse 07/14/2018 9:56 AM

## 2018-07-16 ENCOUNTER — Ambulatory Visit: Payer: 59 | Admitting: Plastic Surgery

## 2018-07-16 ENCOUNTER — Other Ambulatory Visit: Payer: Self-pay | Admitting: Adult Health

## 2018-07-16 ENCOUNTER — Encounter: Payer: Self-pay | Admitting: Plastic Surgery

## 2018-07-16 VITALS — BP 128/81 | HR 83 | Temp 98.2°F | Ht 66.0 in | Wt 250.0 lb

## 2018-07-16 DIAGNOSIS — Z9012 Acquired absence of left breast and nipple: Secondary | ICD-10-CM | POA: Diagnosis not present

## 2018-07-16 DIAGNOSIS — Z170 Estrogen receptor positive status [ER+]: Principal | ICD-10-CM

## 2018-07-16 DIAGNOSIS — C50812 Malignant neoplasm of overlapping sites of left female breast: Secondary | ICD-10-CM

## 2018-07-16 DIAGNOSIS — C50212 Malignant neoplasm of upper-inner quadrant of left female breast: Secondary | ICD-10-CM

## 2018-07-16 MED FILL — LETROZOLE 2.5 MG TABLET: 2.5 | 30 days supply | Qty: 30 | Fill #0

## 2018-07-16 NOTE — Progress Notes (Signed)
   Subjective:    Patient ID: Mackenzie [name], female    DOB: Jul 24, 1970, 48 y.o.   MRN: [id number]  The patient is a 48 yrs old bf here for follow up on her left breast reconstruction options.  She underwent a left mastectomy with reconstruction with an expander and ADM. 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  Radiation ended in July 2019.  She was genetic negative.  She is 5 feet 7 inches tall and weighs 250 pounds.  Her preop bra was a 42C.  Due to the radiation we were not able to expand anymore at that time.  She was aware that she was going to need some form of autologous reconstruction which she has decided on a latissimus muscle flap.  She has grade 3 ptosis of the right breast.  She is currently going through a divorce but seems to be handling it very well.    Review of Systems  Constitutional: Negative.  Negative for activity change.  HENT: Negative.   Eyes: Negative.   Respiratory: Negative.  Negative for shortness of breath.   Cardiovascular: Negative.   Gastrointestinal: Negative.  Negative for abdominal distention.  Endocrine: Negative.   Genitourinary: Negative.   Musculoskeletal: Negative.   Hematological: Negative.   Psychiatric/Behavioral: Negative.        Objective:   Physical Exam Vitals signs and nursing note reviewed.  Constitutional:      Appearance: Normal appearance.  HENT:     Head: Normocephalic and atraumatic.     Right Ear: External ear normal.     Left Ear: External ear normal.     Nose: Nose normal.     Mouth/Throat:     Mouth: Mucous membranes are moist.  Eyes:     Extraocular Movements: Extraocular movements intact.     Pupils: Pupils are equal, round, and reactive to light.  Neck:     Musculoskeletal: Normal range of motion.  Cardiovascular:     Rate and Rhythm: Normal rate.     Pulses: Normal pulses.  Pulmonary:     Effort: Pulmonary  effort is normal.  Abdominal:     General: Abdomen is flat. There is no distension.     Tenderness: There is no abdominal tenderness.  Neurological:     General: No focal deficit present.     Mental Status: She is alert.  Psychiatric:        Mood and Affect: Mood normal.        Thought Content: Thought content normal.        Judgment: Judgment normal.       Assessment & Plan:  Malignant 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, estrogen receptor positive ([place])  Acquired absence of left breast  Malignant neoplasm of upper-inner quadrant of left breast in female, estrogen receptor positive ([place])  We had a long discussion about her timing and options for surgery.  She would like to move ahead with a latissimus muscle flap and plan on after April 48.  This coincides with having some family support. Plan for latissimus muscle flap with expander placement  We placed injectable saline in the Expander using a sterile technique: Left: 50 cc for a total of possible 610 / 535 cc.

## 2018-07-22 ENCOUNTER — Other Ambulatory Visit: Payer: Self-pay | Admitting: *Deleted

## 2018-07-22 DIAGNOSIS — C50912 Malignant neoplasm of unspecified site of left female breast: Secondary | ICD-10-CM

## 2018-07-22 DIAGNOSIS — C773 Secondary and unspecified malignant neoplasm of axilla and upper limb lymph nodes: Principal | ICD-10-CM

## 2018-07-23 ENCOUNTER — Other Ambulatory Visit: Payer: 59

## 2018-07-23 ENCOUNTER — Telehealth: Payer: Self-pay | Admitting: *Deleted

## 2018-07-23 DIAGNOSIS — R7309 Other abnormal glucose: Secondary | ICD-10-CM

## 2018-07-23 NOTE — Telephone Encounter (Signed)
Called patient to remind her about her appointments next week, including fasting lab on Monday and visit with [name], Research RN and Injection on Tuesday for Cycle 3. Instructed patient to fast x 8 hours prior to lab on Monday 2/17 at 9 am.   Also instructed patient to not take Letrozole at home or Ribociclib on Tuesday. Bring meds and medication bottles to clinic along with completed medication diary on Tuesday.  Do not start next cycle of Ribociclib or Letrozole until instructed on Tuesday.  Informed patient there will be one study ECG to complete on Tuesday and will dispense next cycle of Ribociclib.  Patient verbalized understanding.  Patient reports she has noticed some mild blurry vision first thing in the mornings for the past 10 days.  She is concerned her blood sugars may be elevated and asks if Dr. Jana Hakim willing to add Hgb A1C to her lab work on Monday.  Informed patient I will ask Dr. Jana Hakim if it is ok to order.   Thanked patient for her time. Foye Spurling, BSN, RN Clinical Research Nurse 07/23/2018 11:04 AM

## 2018-07-26 ENCOUNTER — Ambulatory Visit: Payer: 59

## 2018-07-26 ENCOUNTER — Ambulatory Visit: Payer: 59 | Admitting: Adult Health

## 2018-07-26 ENCOUNTER — Inpatient Hospital Stay: Payer: 59

## 2018-07-26 DIAGNOSIS — C773 Secondary and unspecified malignant neoplasm of axilla and upper limb lymph nodes: Secondary | ICD-10-CM | POA: Diagnosis not present

## 2018-07-26 DIAGNOSIS — R7303 Prediabetes: Secondary | ICD-10-CM

## 2018-07-26 DIAGNOSIS — Z170 Estrogen receptor positive status [ER+]: Secondary | ICD-10-CM | POA: Diagnosis not present

## 2018-07-26 DIAGNOSIS — C50912 Malignant neoplasm of unspecified site of left female breast: Secondary | ICD-10-CM

## 2018-07-26 DIAGNOSIS — Z006 Encounter for examination for normal comparison and control in clinical research program: Secondary | ICD-10-CM | POA: Diagnosis not present

## 2018-07-26 DIAGNOSIS — Z923 Personal history of irradiation: Secondary | ICD-10-CM | POA: Diagnosis not present

## 2018-07-26 DIAGNOSIS — Z9221 Personal history of antineoplastic chemotherapy: Secondary | ICD-10-CM | POA: Diagnosis not present

## 2018-07-26 DIAGNOSIS — H538 Other visual disturbances: Secondary | ICD-10-CM | POA: Diagnosis not present

## 2018-07-26 DIAGNOSIS — Z9012 Acquired absence of left breast and nipple: Secondary | ICD-10-CM | POA: Diagnosis not present

## 2018-07-26 DIAGNOSIS — Z5111 Encounter for antineoplastic chemotherapy: Secondary | ICD-10-CM | POA: Diagnosis not present

## 2018-07-26 DIAGNOSIS — Z79811 Long term (current) use of aromatase inhibitors: Secondary | ICD-10-CM | POA: Diagnosis not present

## 2018-07-26 DIAGNOSIS — E119 Type 2 diabetes mellitus without complications: Secondary | ICD-10-CM | POA: Diagnosis not present

## 2018-07-26 DIAGNOSIS — R7309 Other abnormal glucose: Secondary | ICD-10-CM

## 2018-07-26 DIAGNOSIS — C50812 Malignant neoplasm of overlapping sites of left female breast: Secondary | ICD-10-CM | POA: Diagnosis not present

## 2018-07-26 HISTORY — DX: Prediabetes: R73.03

## 2018-07-26 LAB — LIPASE, BLOOD: Lipase: 28 U/L (ref 11–51)

## 2018-07-26 LAB — CBC WITH DIFFERENTIAL (CANCER CENTER ONLY)
Abs Immature Granulocytes: 0 10*3/uL (ref 0.00–0.07)
Basophils Absolute: 0 10*3/uL (ref 0.0–0.1)
Basophils Relative: 1 %
EOS PCT: 1 %
Eosinophils Absolute: 0 10*3/uL (ref 0.0–0.5)
HCT: 36.4 % (ref 36.0–46.0)
Hemoglobin: 11.6 g/dL — ABNORMAL LOW (ref 12.0–15.0)
Immature Granulocytes: 0 %
Lymphocytes Relative: 21 %
Lymphs Abs: 0.9 10*3/uL (ref 0.7–4.0)
MCH: 27 pg (ref 26.0–34.0)
MCHC: 31.9 g/dL (ref 30.0–36.0)
MCV: 84.7 fL (ref 80.0–100.0)
MONO ABS: 0.5 10*3/uL (ref 0.1–1.0)
Monocytes Relative: 11 %
Neutro Abs: 2.8 10*3/uL (ref 1.7–7.7)
Neutrophils Relative %: 66 %
Platelet Count: 267 10*3/uL (ref 150–400)
RBC: 4.3 MIL/uL (ref 3.87–5.11)
RDW: 17.2 % — ABNORMAL HIGH (ref 11.5–15.5)
WBC: 4.2 10*3/uL (ref 4.0–10.5)
nRBC: 0 % (ref 0.0–0.2)

## 2018-07-26 LAB — CMP (CANCER CENTER ONLY)
ALBUMIN: 3.9 g/dL (ref 3.5–5.0)
ALT: 17 U/L (ref 0–44)
ANION GAP: 9 (ref 5–15)
AST: 21 U/L (ref 15–41)
Alkaline Phosphatase: 133 U/L — ABNORMAL HIGH (ref 38–126)
BILIRUBIN TOTAL: 0.4 mg/dL (ref 0.3–1.2)
BUN: 15 mg/dL (ref 6–20)
CO2: 26 mmol/L (ref 22–32)
Calcium: 9.8 mg/dL (ref 8.9–10.3)
Chloride: 103 mmol/L (ref 98–111)
Creatinine: 1.09 mg/dL — ABNORMAL HIGH (ref 0.44–1.00)
GFR, Est AFR Am: >60 mL/min (ref >60)
GFR, Estimated: 60 mL/min — ABNORMAL LOW (ref >60)
GLUCOSE: 101 mg/dL — AB (ref 70–99)
Potassium: 3.8 mmol/L (ref 3.5–5.1)
Sodium: 138 mmol/L (ref 135–145)
Total Protein: 8.1 g/dL (ref 6.5–8.1)

## 2018-07-26 LAB — URIC ACID: URIC ACID, SERUM: 4.5 mg/dL (ref 2.5–7.1)

## 2018-07-26 LAB — BILIRUBIN, DIRECT: Bilirubin, Direct: 0.1 mg/dL (ref 0.0–0.2)

## 2018-07-26 LAB — LACTATE DEHYDROGENASE: LDH: 190 U/L (ref 98–192)

## 2018-07-26 LAB — MAGNESIUM: Magnesium: 1.7 mg/dL (ref 1.7–2.4)

## 2018-07-26 LAB — PHOSPHORUS: Phosphorus: 4.1 mg/dL (ref 2.5–4.6)

## 2018-07-26 LAB — GAMMA GT: GGT: 23 U/L (ref 7–50)

## 2018-07-26 LAB — AMYLASE: Amylase: 66 U/L (ref 28–100)

## 2018-07-27 ENCOUNTER — Encounter: Payer: Self-pay | Admitting: *Deleted

## 2018-07-27 ENCOUNTER — Inpatient Hospital Stay (HOSPITAL_BASED_OUTPATIENT_CLINIC_OR_DEPARTMENT_OTHER): Payer: 59 | Admitting: Adult Health

## 2018-07-27 ENCOUNTER — Inpatient Hospital Stay: Payer: 59

## 2018-07-27 ENCOUNTER — Encounter: Payer: Self-pay | Admitting: Adult Health

## 2018-07-27 VITALS — BP 133/85 | HR 81 | Temp 98.5°F | Resp 16 | Ht 60.0 in | Wt 257.3 lb

## 2018-07-27 DIAGNOSIS — C50812 Malignant neoplasm of overlapping sites of left female breast: Secondary | ICD-10-CM

## 2018-07-27 DIAGNOSIS — Z79811 Long term (current) use of aromatase inhibitors: Secondary | ICD-10-CM

## 2018-07-27 DIAGNOSIS — Z006 Encounter for examination for normal comparison and control in clinical research program: Secondary | ICD-10-CM

## 2018-07-27 DIAGNOSIS — C773 Secondary and unspecified malignant neoplasm of axilla and upper limb lymph nodes: Secondary | ICD-10-CM | POA: Diagnosis not present

## 2018-07-27 DIAGNOSIS — Z170 Estrogen receptor positive status [ER+]: Secondary | ICD-10-CM

## 2018-07-27 DIAGNOSIS — Z9012 Acquired absence of left breast and nipple: Secondary | ICD-10-CM

## 2018-07-27 DIAGNOSIS — Z923 Personal history of irradiation: Secondary | ICD-10-CM | POA: Diagnosis not present

## 2018-07-27 DIAGNOSIS — E119 Type 2 diabetes mellitus without complications: Secondary | ICD-10-CM | POA: Diagnosis not present

## 2018-07-27 DIAGNOSIS — Z9221 Personal history of antineoplastic chemotherapy: Secondary | ICD-10-CM

## 2018-07-27 DIAGNOSIS — Z5111 Encounter for antineoplastic chemotherapy: Secondary | ICD-10-CM | POA: Diagnosis not present

## 2018-07-27 DIAGNOSIS — H538 Other visual disturbances: Secondary | ICD-10-CM

## 2018-07-27 LAB — HEMOGLOBIN A1C
Hgb A1c MFr Bld: 5.9 % — ABNORMAL HIGH (ref 4.8–5.6)
Mean Plasma Glucose: 123 mg/dL

## 2018-07-27 MED ORDER — GOSERELIN ACETATE 3.6 MG ~~LOC~~ IMPL
DRUG_IMPLANT | SUBCUTANEOUS | Status: AC
  Filled 2018-07-27: qty 3.6

## 2018-07-27 MED ORDER — GOSERELIN ACETATE 3.6 MG ~~LOC~~ IMPL
3.6000 mg | DRUG_IMPLANT | Freq: Once | SUBCUTANEOUS | Status: AC
  Administered 2018-07-27: 3.6 mg via SUBCUTANEOUS

## 2018-07-27 NOTE — Progress Notes (Signed)
[facility]  Telephone:(336) [phone] [phone]     ID: Mackenzie [name] DOB: 20-Feb-1971  MR#: [id number]  XLK#:[id number]  Patient Care Team: Willey Blade, MD as PCP - General (Internal Medicine) Magrinat, [name], MD as Consulting Physician (Oncology) Erroll Luna, MD as Consulting Physician (General Surgery) Kyung Rudd, MD as Consulting Physician (Radiation Oncology) Dillingham, Loel Lofty, DO as Attending Physician (Plastic Surgery) Cathlean Cower, RN as Registered Nurse OTHER MD:  CHIEF COMPLAINT: Estrogen receptor positive breast cancer  CURRENT TREATMENT: letrozole, goserelin; on [name] trial   HISTORY OF CURRENT ILLNESS: From the original intake note:  The patient herself noted some changes in her left breast late July 2018, she says, and eventually brought this to medical attention so that on 01/09/2017 she underwent bilateral diagnostic mammography with tomography and left breast ultrasonography at the breast Center. This found the breast density to be category C. In the upper inner quadrant of the left breast there was an area of asymmetry and there were malignant type calcifications involving all 4 quadrants. On exam there is firmness and palpable thickening in the anterior left breast with skin dimpling. Ultrasonography found at the 9:30 o'clock radiant 3 cm from the nipple a 2.7 cm mass and in the left axilla for abnormal lymph nodes largest of which measured 2.7 cm.  Biopsy of the left breast 9:30 o'clock mass 80 01/26/2017 showed ([id number]) invasive ductal carcinoma, with extracellular mucin, grade 1 or 2. In the lower outer left breast is separated biopsy the same day showed ductal carcinoma in situ. One of the 4 lymph nodes involved was positive for metastatic carcinoma. Prognostic panel on the invasive disease showed it to be estrogen receptor 100% positive, progesterone receptor 20% positive, both with strong staining intensity,  with an MIB-1 of 20%, and no HER-2 amplification with a signals ratio 1.38 and the number per cell 2.90.  On 01/22/2017 the patient underwent bilateral breast MRI. This showed no involvement of the right breast, but in the left breast there was a masslike and non-masslike enhancement involving all quadrants, with skin swelling but no abnormal enhancement of the skin or nipple areolar complex or pectoralis muscle. The mass could not be clearly measured but spanned approximately 12 cm. There was bulky left axillary lymphadenopathy, with the largest lymph node measuring up to 3.4 cm.  The patient's subsequent history is as detailed below.  INTERVAL HISTORY: Mackenzie [name] returns today for follow-up of her estrogen receptor positive breast cancer. She is here to start cycle 3 day 1 of the [name] clinical trial.    The patient continues on letrozole, which she is tolerating well. She denies hot flashes, arthralgias, vaginal dryness.    She also receives Goserelin every 4 weeks which she is tolerating well.    She starts on Ribociclib today.  She notes some mild fatigue with this.  She is still active and working 8 hour days.      REVIEW OF SYSTEMS: Mackenzie [name] is nauseated in the mornings, but this resolves with eating.  She is also noted occasional constipation and occasional diarrhea.  These are minimal.  She had chest congestion at her last visit, but this is improved since her last appt.  She has occ dry cough and is prescribed Symbicort BID.  She sometimes forgets to take this.  She notes that she is waking up with some blurred vision.  She has a h/o prediabetes, and we drew an A1C today that was 5.9.  Mackenzie [name] has some headaches, but really cannot tell me where they are located, how often they happen, or anything related to them at all.  She says she will pay more attention this next month.  She denies any dysphagia, indigestion.  She is without chest pain, palpitations, or shortness of breath.  She has  no bladder changes.  She notes she is going to undergo reconstruction with Dr. Marla Roe in early April of this year.  A detailed ROS was otherwise non contributory today.      PAST MEDICAL HISTORY: Past Medical History:  Diagnosis Date  . Abnormal glucose 2018  . Allergic rhinitis 2012  . Anemia 01/27/2017   prior to starting chemotherapy  . Breast cancer ([place]) 01/14/2017   Left breast  . Diabetes mellitus 2012   GDM  . GERD (gastroesophageal reflux disease) 2018  . Hot flashes 03/2017  . Hyperlipidemia 03/20/2016  . Hypertension 2012  . NCGS (non-celiac gluten sensitivity)   . Vitamin D deficiency 05/2015    PAST SURGICAL HISTORY: Past Surgical History:  Procedure Laterality Date  . BREAST RECONSTRUCTION WITH PLACEMENT OF TISSUE EXPANDER AND FLEX HD (ACELLULAR HYDRATED DERMIS) Left 08/27/2017   Procedure: LEFT BREAST RECONSTRUCTION WITH PLACEMENT OF TISSUE EXPANDER AND FLEX HD;  Surgeon: Wallace Going, DO;  Location: [facility];  Service: Plastics;  Laterality: Left;  . CESAREAN SECTION     x2  . MASTECTOMY MODIFIED RADICAL Left 08/27/2017  . MASTECTOMY MODIFIED RADICAL Left 08/27/2017   Procedure: LEFT MODIFIED RADICAL MASTECTOMY;  Surgeon: Erroll Luna, MD;  Location: [facility];  Service: General;  Laterality: Left;  . PORT-A-CATH REMOVAL Right 08/27/2017   Procedure: REMOVAL PORT-A-CATH RIGHT CHEST;  Surgeon: Erroll Luna, MD;  Location: [facility];  Service: General;  Laterality: Right;  . PORTACATH PLACEMENT Right 01/28/2017   Procedure: INSERTION PORT-A-CATH WITH ULTRASOUND;  Surgeon: Erroll Luna, MD;  Location: [facility];  Service: General;  Laterality: Right;  . TUBAL LIGATION Bilateral 01/21/2011    FAMILY HISTORY Family History  Problem Relation Age of Onset  . Breast cancer Paternal Grandmother 27       d.60s from breast cancer. Did not have treatment.  . Other Mother        G.40 from complications of surgery to remove brain tumor   The  patient's father still alive at age 76. The patient's mother died with a brain tumor which the patient says was "benign". She was 56. The patient has one brother, no sisters. The only breast cancer in the family is a paternal grandmother who died from breast cancer at an unknown age. There is no history of ovarian or prostate cancer in the family.   GYNECOLOGIC HISTORY:  No LMP recorded. (Menstrual status: Chemotherapy). Menarche age 28 and first live birth age 14 she is Mackenzie [name] P4. The person is still having regular periods as of August 2018   SOCIAL HISTORY: (Updated 05/31/2018) Mackenzie [name] works at [employer] on the fifth floor, [place]. She is separated from her husband (as of 05/2018), [name], who is a Building control surveyor. Daughter is married and lives in [place] and works as a Education administrator. Mackenzie [name]'s daughter, Mackenzie [name], lives in [place] and works in the police department. Daughters, Mackenzie [name] and Mackenzie [name] are 19 and 6, living at home. The patient has no grandchildren. She attends a [employer].     ADVANCED DIRECTIVES: Not in place   HEALTH MAINTENANCE: Social History   Tobacco Use  . Smoking status: Never Smoker  . Smokeless  tobacco: Never Used  Substance Use Topics  . Alcohol use: Yes    Comment: social  . Drug use: No    Colonoscopy: Never  PAP: December 2017  Bone density: Never   No Known Allergies  Current Outpatient Medications  Medication Sig Dispense Refill  . acetaminophen (TYLENOL) 500 MG tablet Take 1,000 mg by mouth every 6 (six) hours as needed (for pain/headaches.).     Marland Kitchen B Complex Vitamins (VITAMIN B COMPLEX PO) Take 1 capsule by mouth 2 (two) times daily.     . benzonatate (TESSALON) 100 MG capsule Take 100 mg by mouth every 8 (eight) hours as needed for cough.   0  . budesonide-formoterol (SYMBICORT) 80-4.5 MCG/ACT inhaler Inhale 2 puffs into the lungs 2 (two) times daily.     . Calcium Carb-Cholecalciferol (CALCIUM 600+D3 PO) Take 1 tablet by mouth 2 (two)  times daily.     . cetirizine (ZYRTEC) 10 MG tablet Take 10 mg by mouth daily.     . Cholecalciferol (VITAMIN D-3) 5000 units TABS Take 5,000 Units by mouth 2 (two) times daily.     Marland Kitchen dextromethorphan-guaiFENesin (MUCINEX DM) 30-600 MG 12hr tablet Take 1 tablet by mouth as needed for cough.    . gabapentin (NEURONTIN) 300 MG capsule Take 1 capsule (300 mg total) by mouth at bedtime. (Patient taking differently: Take 300 mg by mouth at bedtime. ) 90 capsule 4  . ibuprofen (ADVIL,MOTRIN) 200 MG tablet Take 800 mg by mouth every 8 (eight) hours as needed for mild pain (for pain.).     . Investigational ribociclib (KISQALI) 200 MG tablet [name] Take 2 tablets (400 mg total) by mouth daily. then 7 days off. 75 tablet 0  . letrozole (FEMARA) 2.5 MG tablet TAKE 1 TABLET BY MOUTH DAILY. 30 tablet 5  . loratadine (CLARITIN) 10 MG tablet Take 1 tablet (10 mg total) by mouth daily. (Patient taking differently: Take 10 mg by mouth daily as needed for allergies. ) 90 tablet 2  . montelukast (SINGULAIR) 10 MG tablet Take 10 mg by mouth daily as needed (for allergies.).     Marland Kitchen Multiple Vitamin (MULTIVITAMIN WITH MINERALS) TABS tablet Take 1 tablet by mouth daily.     . Multiple Vitamins-Minerals (EMERGEN-C IMMUNE PO) Take by mouth as needed.    . Multiple Vitamins-Minerals (ZINC PO) Take 1 tablet by mouth daily.     . Omega-3 Fatty Acids (FISH OIL) 1000 MG CAPS Take 1,000 mg by mouth 2 (two) times daily.     . phenylephrine (SUDAFED PE) 10 MG TABS tablet Take 10 mg by mouth every 4 (four) hours as needed.    Marland Kitchen Phenylephrine-Pheniramine-DM (THERAFLU COLD & COUGH PO) Take by mouth as needed.    Marland Kitchen PREVIDENT 5000 BOOSTER PLUS 1.1 % PSTE 2 (two) times daily.   2  . Probiotic Product (PROBIOTIC DAILY PO) Take by mouth 2 (two) times daily.     Marland Kitchen senna (SENOKOT) 8.6 MG TABS tablet Take 1 tablet (8.6 mg total) by mouth 2 (two) times daily. (Patient taking differently: Take 1 tablet by mouth 2 (two) times daily.  Patient takes prn) 120 each 0   No current facility-administered medications for this visit.    Facility-Administered Medications Ordered in Other Visits  Medication Dose Route Frequency Provider Last Rate Last Dose  . sodium chloride flush (NS) 0.9 % injection 10 mL  10 mL Intravenous PRN Magrinat, [name], MD   10 mL at 03/10/17 1239  OBJECTIVE:  Vitals:   07/27/18 0903  BP: 133/85  Pulse: 81  Resp: 16  Temp: 98.5 F (36.9 C)  SpO2: 100%     Body mass index is 50.25 kg/m.   Wt Readings from Last 3 Encounters:  07/27/18 257 lb 5 oz (116.7 kg)  07/16/18 250 lb (113.4 kg)  07/12/18 254 lb 11.2 oz (115.5 kg)   ECOG FS: 1 - Symptomatic but completely ambulatory GENERAL: Patient is a well appearing female in no acute distress HEENT:  Sclerae anicteric.  Oropharynx clear and moist. No ulcerations or evidence of oropharyngeal candidiasis. Neck is supple.  NODES:  No cervical, supraclavicular, or axillary lymphadenopathy palpated.  BREAST EXAM:  Right breast benign, left breast s/p mastectomy and radiation, no sign of local recurrence LUNGS:  Clear to auscultation bilaterally.  No wheezes or rhonchi. HEART:  Regular rate and rhythm. No murmur appreciated. ABDOMEN:  Soft, nontender.  Positive, normoactive bowel sounds. No organomegaly palpated. MSK:  No focal spinal tenderness to palpation. Full range of motion bilaterally in the upper extremities. EXTREMITIES:  No peripheral edema.   SKIN:  Clear with no obvious rashes or skin changes. No nail dyscrasia. NEURO:  Nonfocal. Well oriented.  Appropriate affect.    LAB RESULTS:    Appointment on 07/26/2018  Component Date Value Ref Range Status  . Hgb A1c MFr Bld 07/26/2018 5.9* 4.8 - 5.6 % Final   Comment: (NOTE)         Prediabetes: 5.7 - 6.4         Diabetes: >6.4         Glycemic control for adults with diabetes: <7.0   . Mean Plasma Glucose 07/26/2018 123  mg/dL Final   Comment: (NOTE) Performed At: [facility] [place], [place] [id number] Rush Farmer MD [id number]   . GGT 07/26/2018 23  7 - 50 U/L Final   Performed at [facility], [place] [address], [place], [place] [address]  . Lipase 07/26/2018 28  11 - 51 U/L Final   Performed at [facility], [place] [address] [place], [place] [address]  . Amylase 07/26/2018 66  28 - 100 U/L Final   Performed at [facility], [place] [address], [place], [place] [address]  . LDH 07/26/2018 190  98 - 192 U/L Final   Performed at [facility], [place] [name] [place], [place] [address]  . Bilirubin, Direct 07/26/2018 0.1  0.0 - 0.2 mg/dL Final   Performed at [place] [address], [place], [place] [address]  . Uric Acid, Serum 07/26/2018 4.5  2.5 - 7.1 mg/dL Final   Performed at [facility], [place] [address], [place], [place] [address]  . Phosphorus 07/26/2018 4.1  2.5 - 4.6 mg/dL Final   Performed at [place] [address] [place], [place] [address]  . Magnesium 07/26/2018 1.7  1.7 - 2.4 mg/dL Final   Performed at [facility], [place] [address] [place], [place] [address]  . Sodium 07/26/2018 138  135 - 145 mmol/L Final  . Potassium 07/26/2018 3.8  3.5 - 5.1 mmol/L Final  . Chloride 07/26/2018 103  98 - 111 mmol/L Final  . CO2 07/26/2018 26  22 - 32 mmol/L Final  . Glucose, Bld 07/26/2018 101* 70 - 99 mg/dL Final  . BUN 07/26/2018 15  6 - 20 mg/dL Final  . Creatinine 07/26/2018 1.09* 0.44 -  1.00 mg/dL Final  . Calcium 07/26/2018 9.8  8.9 - 10.3 mg/dL Final  . Total Protein 07/26/2018 8.1  6.5 - 8.1 g/dL Final  . Albumin 07/26/2018 3.9  3.5 - 5.0 g/dL Final  . AST 07/26/2018 21  15 - 41 U/L Final  . ALT 07/26/2018 17  0 - 44 U/L Final  . Alkaline Phosphatase 07/26/2018 133* 38 - 126 U/L Final  . Total Bilirubin 07/26/2018 0.4  0.3 - 1.2 mg/dL Final  . GFR, Est Non Af  Am 07/26/2018 60* >60 mL/min Final  . GFR, Est AFR Am 07/26/2018 >60  >60 mL/min Final  . Anion gap 07/26/2018 9  5 - 15 Final   Performed at [facility], [place] [address], [place], [place] [address]  . WBC Count 07/26/2018 4.2  4.0 - 10.5 K/uL Final  . RBC 07/26/2018 4.30  3.87 - 5.11 MIL/uL Final  . Hemoglobin 07/26/2018 11.6* 12.0 - 15.0 g/dL Final  . HCT 07/26/2018 36.4  36.0 - 46.0 % Final  . MCV 07/26/2018 84.7  80.0 - 100.0 fL Final  . MCH 07/26/2018 27.0  26.0 - 34.0 pg Final  . MCHC 07/26/2018 31.9  30.0 - 36.0 g/dL Final  . RDW 07/26/2018 17.2* 11.5 - 15.5 % Final  . Platelet Count 07/26/2018 267  150 - 400 K/uL Final  . nRBC 07/26/2018 0.0  0.0 - 0.2 % Final  . Neutrophils Relative % 07/26/2018 66  % Final  . Neutro Abs 07/26/2018 2.8  1.7 - 7.7 K/uL Final  . Lymphocytes Relative 07/26/2018 21  % Final  . Lymphs Abs 07/26/2018 0.9  0.7 - 4.0 K/uL Final  . Monocytes Relative 07/26/2018 11  % Final  . Monocytes Absolute 07/26/2018 0.5  0.1 - 1.0 K/uL Final  . Eosinophils Relative 07/26/2018 1  % Final  . Eosinophils Absolute 07/26/2018 0.0  0.0 - 0.5 K/uL Final  . Basophils Relative 07/26/2018 1  % Final  . Basophils Absolute 07/26/2018 0.0  0.0 - 0.1 K/uL Final  . Immature Granulocytes 07/26/2018 0  % Final  . Abs Immature Granulocytes 07/26/2018 0.00  0.00 - 0.07 K/uL Final   Performed at [facility], [place] [address], [place], [place] [address]     STUDIES: Electrocardiogram in process  ELIGIBLE FOR AVAILABLE RESEARCH PROTOCOL: [name], ASA study   ASSESSMENT: 48 y.o. [place] woman status post left breast upper inner quadrant biopsy 01/14/2017 for a clinical T3 N2, stage IIA invasive ductal carcinoma, grade 1 or 2, estrogen and progesterone receptor positive, HER-2 nonamplified, with an MIB-1 of 20%.  (1) staging studies: Brain MRI, bone scan, and CT scan of the chest 02/05/2017 showed no brain lesions, no lung or  liver lesions, a 4.9 cm mass in the left breast with left axillary and subpectoral adenopathy, and nonspecific bone scan tracer at L2, left scapula, and anterior ribs, with lumbar spine MRI suggested for further evaluation.  (a) lumbar spine MRI 02/17/2017 showed no normal bone lesions.  There was mild lumbar spondylosis  (2) neoadjuvant chemotherapy consisting of cyclophosphamide and doxorubicin in dose dense fashion 4 started 02/10/2017, completed 03/24/2017, followed by weekly carboplatin and gemcitabine given days 1 and 8 of each 21-day cycle starting 04/14/2017, completing the planned 4 cycles 06/26/2017  (3) is post left modified radical mastectomy on 08/27/2017 showing an mpT3 pN2 residual invasive ductal carcinoma, grade 2, with a residual cancer burden of 3.  Margins were clear  (a) TRAM flap reconstruction  planned  (4) postmastectomy radiation completed 01/01/2018  (a) capecitabine radiosensitization 11/16/2017-01/01/2018  (5) goserelin started 09/21/2017  (a) letrozole started 01/11/2018  (b) enrolled in [place] clinical trial, randomized to Ribociclib on 05/31/2018  (6) genetics testing 03/24/2017 through the Common Hereditary Cancer Panel offered by [facility] found no deleterious mutations in APC, ATM, AXIN2, BARD1, BMPR1A, BRCA1, BRCA2, BRIP1, CDH1, CDKN2A (p14ARF), CDKN2A (p16INK4a), CHEK2, CTNNA1, DICER1, EPCAM (Deletion/duplication testing only), GREM1 (promoter region deletion/duplication testing only), KIT, MEN1, MLH1, MSH2, MSH3, MSH6, MUTYH, NBN, NF1, NHTL1, PALB2, PDGFRA, PMS2, POLD1, POLE, PTEN, RAD50, RAD51C, RAD51D, SDHB, SDHC, SDHD, SMAD4, SMARCA4. STK11, TP53, TSC1, TSC2, and VHL.  The following genes were evaluated for sequence changes only: SDHA and HOXB13 c.251G>A variant only.    PLAN: Mackenzie [name] is doing well today.  She has no clinical sign of breast cancer recurrence.  She is tolerating her treatment well with Goserelin, Letrozole and Ribociclib.  She will continue on  the [place] study and we reviewed this with Mackenzie [name], her research nurse.  Mackenzie [name] was recommended to increase her physical activity with walking.  She is signed up to hopefully start the [place] program after she is cleared by plastic surgery for her reconstruction.  We reviewed her blurred vision.  I find it hard to believe that it could be secondary to her diabetes.  She has no other focal symptoms, so I recommended between now and her next visit she have a detailed eye exam.    Mackenzie [name] will return in 4 weeks for f/u with labs done a couple of days prior.  She knows to call for any problem that may develop before the next visit.    A total of (30) minutes of face-to-face time was spent with this patient with greater than 50% of that time in counseling and care-coordination.   [name], NP 07/27/18 10:58 AM Medical Oncology and Hematology [facility] [address] [place], [place] [address] Tel. [phone]    Fax. [phone]

## 2018-07-27 NOTE — Research (Signed)
Assessment for C3D1OF [name]: Patient into clinic by herself this morning to complete study activities. Study visit is being completed on Day 2 of Cycle 3 due to patient's work schedule.  Labs:  Fasting labs completed on 07/23/18. All results fall within study parameters to continue treatment without dose adjustment. Dr. Jana Hakim reviewed labs and agrees they are adequate to continue treatment with Ribociclib. VS: Completed per protocol. BP taken after patient resting x 5 minutes and weight obtained without shoes or heavy clothing.  Con Meds: Reviewed with patient. Medication list is up to date. Reminded patient to notify research nurse of any new medications prior to taking them unless it is an emergency. She verbalized understanding.  History & Physical: Completed by [name], NP.  See provider encounter note for today's date.  AEs: Reviewed with patient by this research RN and [name]. See AE table below. Nausea- Patient reports new onset nausea started about 1 week ago.  She has not taken any medication for this as it is relieved by eating something and has only been occurring first thing in the mornings.   Blurred vision- Patient also reports blurred vision first thing in the mornings which resolves after she is awake for less than one hour. This started right after her last visit.  Diarrhea- Patient reports "a few" episodes of diarrhea or loose stools in the past few weeks since last visit. It resolved on it's own and she did not take any medication for this.  Lower back pain and nasal congestion have resolved since last visit.  Pre-dose ECG: Study ECG done at 9:50 am shows QTcF = 447m. Dr. MJana Hakimreviewed and states ok to start Ribociclib Cycle 3 today on study as planned.   Study Drug:Patient states she has not missed any doses of Ribociclib or Letrozole this cycle, except she did not take Ribociclib yesterday (C3D1) as instructed. Patient returned completed medication  diary for Cycle 2 in which she documented no missed doses of Ribociclib or Letrozole. Patient returned bottle of Ribociclib kit# [id number] 33 pills left in bottle, which is the expected number of pills remaining for patient taking 2 daily for 21 days this cycle. IRT assigned [id number] for cycle3 Ribociclib.  Dispensed by BRaul Del Pharmacist. Double checked patients name, DOB, Study ID #, Kit#  and expiration date.  Ribociclib then given to patient along with medication diary for cycle 3 Ribociclib and Letrozole. Patient took dose of Ribociclib and Letrozole in clinic at 10:45 AM and documented in her medication diary. Goserelin injection given by Injection nurse today after study visit.   AE Table: Event Grade Onset Date End Date Status Comments Attribution to Ribociclib Attribution to Letrozole Attribution to Goserelin  Allergic Rhinitis 2 2012  ongoing At Baseline n/a n/a n/a  Anemia 1 2018  ongoing At Baseline n/a n/a n/a  Arthralgias, legs 1 12/2017  ongoing At Baseline n/a n/a n/a  Constipation 1 08/27/17  ongoing At Baseline n/a n/a n/a  Cough, intermittent 2 05/09/18  ongoing At Baseline n/a n/a n/a  GERD 1 2018  intermittent At Baseline n/a n/a n/a  Gluten Sensitivity 1 12/2017  ongoing At Baseline n/a n/a n/a  Headaches 1 02/2017  ongoing At Baseline n/a n/a n/a  Hot Flashes 2 03/2017  ongoing At Baseline n/a n/a n/a  Hyperglycemia 1 2018  ongoing At Baseline n/a n/a n/a  Hyperlipidemia 1 03/2016  ongoing At Baseline n/a n/a n/a  Vitamin D Deficiency 2 05/2015  ongoing  At Baseline n/a n/a n/a  Hypertension, intermittent 1 06/14/18  ongoing Grade 2 at baseline n/a n/a n/a  Nasal Congestion 2 06/11/18 07/19/18 resolved  No No No  Fatigue 1 06/28/18  ongoing   Yes No No  Lower back/flank pain 1 07/12/18 07/12/18 resolved  No No No  Blurred Vision 1 07/14/18  ongoing New No No No  Nausea 1 07/19/18  ongoing New Yes No No  Diarrhea, intermittent 1 07/13/18  ongoing Recurrent  Yes No No   Thanked patient for her participation today.  Reviewed schedule for next cycle with fasting lab on Friday 3/13 and study visit on Monday 3/16 for C4D1.  Instructed patient to call for any questions or concerns prior to next visit. She verbalized understanding.   Foye Spurling, BSN, RN Clinical Research Nurse 07/27/2018 11:37 AM

## 2018-08-02 ENCOUNTER — Telehealth: Payer: Self-pay | Admitting: *Deleted

## 2018-08-02 NOTE — Telephone Encounter (Signed)
LVM for patient informing her of appointments on 3/13 and 3/16.  Asked her to call back if these times do not work for her or any questions.  Foye Spurling, BSN, RN Clinical Research Nurse 08/02/2018 2:12 PM

## 2018-08-12 DIAGNOSIS — H3562 Retinal hemorrhage, left eye: Secondary | ICD-10-CM | POA: Diagnosis not present

## 2018-08-13 MED FILL — LETROZOLE 2.5 MG TABS: 2.5 | 30 days supply | Qty: 30 | Fill #1 | Status: TO

## 2018-08-17 ENCOUNTER — Ambulatory Visit: Payer: 59 | Admitting: Cardiology

## 2018-08-17 ENCOUNTER — Encounter: Payer: Self-pay | Admitting: Cardiology

## 2018-08-17 VITALS — BP 125/83 | HR 92 | Ht 60.0 in | Wt 259.0 lb

## 2018-08-17 DIAGNOSIS — Z170 Estrogen receptor positive status [ER+]: Secondary | ICD-10-CM

## 2018-08-17 DIAGNOSIS — I10 Essential (primary) hypertension: Secondary | ICD-10-CM | POA: Insufficient documentation

## 2018-08-17 DIAGNOSIS — R9431 Abnormal electrocardiogram [ECG] [EKG]: Secondary | ICD-10-CM | POA: Diagnosis not present

## 2018-08-17 DIAGNOSIS — C50212 Malignant neoplasm of upper-inner quadrant of left female breast: Secondary | ICD-10-CM

## 2018-08-17 NOTE — Progress Notes (Signed)
Cardiology Office Note    Date:  08/17/2018   ID:  Mackenzie [name], DOB 19-May-1971, MRN [id number]  PCP:  Willey Blade, MD  Cardiologist:  Fransico Him, MD   Chief Complaint  Patient presents with  . New Patient (Initial Visit)    Abnormal EKG    History of Present Illness:  Mackenzie [name] is a 48 y.o. female who is being seen today for the evaluation of abnormal EKG at the request of Mackenzie [name]*.  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  She also has a history of diabetes mellitus, hyperlipidemia and hypertension.   She has undergone chemotherapy with cyclophosphamide and doxorubicin which was completed on 03/24/2017 and was followed by weekly carboplatinum and gemcitabine completed on June 26, 2017.  She is status post left modified radical mastectomy in March 2019.  She underwent breast reconstruction and postmastectomy radiation.  At her last office visit in January She had 2 EKGs done the first was during the clinic visit and the other one was for her research drug.  Reportedly the EKG for the research was documented in the research note as showing sinus rhythm with sinus arrhythmia possible inferior MI probably old with posterior extension.  Dr. Lindi Adie, the oncology physician, reviewed the clinic EKG  and felt there was no need for further cardiology consult but a consult was made anyway.  She is here today and is doing well.  She denies any chest pain or pressure, SOB, DOE, PND, orthopnea, LE edema, dizziness, palpitations or syncope. She is compliant with her meds and is tolerating meds with no SE.    Past Medical History:  Diagnosis Date  . Abnormal glucose 2018  . 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 Allergic rhinitis 2012  . Anemia 01/27/2017   prior to starting chemotherapy  . Breast cancer ([place]) 01/14/2017   Left breast  . 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, left ([place]) 01/23/2017  . Diabetes mellitus 2012   GDM  . GERD (gastroesophageal reflux disease) 2018  . Hot flashes 03/2017  . Hyperlipidemia 03/20/2016  . Hypertension 2012  . Morbid obesity with body mass index (BMI) of 40.0 to 49.9 ([place]) 09/17/2017  . NCGS (non-celiac gluten sensitivity)   . Vitamin D deficiency 05/2015    Past Surgical History:  Procedure Laterality Date  . BREAST RECONSTRUCTION WITH PLACEMENT OF TISSUE EXPANDER AND FLEX HD (ACELLULAR HYDRATED DERMIS) Left 08/27/2017   Procedure: LEFT BREAST RECONSTRUCTION WITH PLACEMENT OF TISSUE EXPANDER AND FLEX HD;  Surgeon: Wallace Going, DO;  Location: [facility];  Service: Plastics;  Laterality: Left;  . CESAREAN SECTION     x2  . MASTECTOMY MODIFIED RADICAL Left 08/27/2017  . MASTECTOMY MODIFIED RADICAL Left 08/27/2017   Procedure: LEFT MODIFIED RADICAL MASTECTOMY;  Surgeon: Erroll Luna, MD;  Location: [facility];  Service: General;  Laterality: Left;  . PORT-A-CATH REMOVAL Right 08/27/2017   Procedure: REMOVAL PORT-A-CATH RIGHT CHEST;  Surgeon: Erroll Luna, MD;  Location: [facility];  Service: General;  Laterality: Right;  . PORTACATH PLACEMENT Right 01/28/2017   Procedure: INSERTION PORT-A-CATH WITH ULTRASOUND;  Surgeon: Erroll Luna, MD;  Location: [facility];  Service: General;  Laterality: Right;  . TUBAL LIGATION Bilateral 01/21/2011    Current Medications: Current Meds  Medication Sig  . acetaminophen (TYLENOL) 500 MG tablet Take 1,000 mg by mouth every 6 (six) hours as needed (for pain/headaches.).   Marland Kitchen B Complex Vitamins (VITAMIN B COMPLEX PO) Take 1 capsule by mouth 2 (two) times daily.   . benzonatate (TESSALON) 100 MG capsule Take 100 mg by mouth every 8 (eight) hours as needed for cough.   . budesonide-formoterol (SYMBICORT) 80-4.5 MCG/ACT inhaler Inhale 2  puffs into the lungs 2 (two) times daily.   . Calcium Carb-Cholecalciferol (CALCIUM 600+D3 PO) Take 1 tablet by mouth 2 (two) times daily.   . cetirizine (ZYRTEC) 10 MG tablet Take 10 mg by mouth daily.   . Cholecalciferol (VITAMIN D-3) 5000 units TABS Take 5,000 Units by mouth 2 (two) times daily.   Marland Kitchen dextromethorphan-guaiFENesin (MUCINEX DM) 30-600 MG 12hr tablet Take 1 tablet by mouth as needed for cough.  . gabapentin (NEURONTIN) 300 MG capsule Take 1 capsule (300 mg total) by mouth at bedtime. (Patient taking differently: Take 300 mg by mouth at bedtime. )  . ibuprofen (ADVIL,MOTRIN) 200 MG tablet Take 800 mg by mouth every 8 (eight) hours as needed for mild pain (for pain.).   . Investigational ribociclib (KISQALI) 200 MG tablet [name] Take 2 tablets (400 mg total) by mouth daily. then 7 days off.  . letrozole (FEMARA) 2.5 MG tablet TAKE 1 TABLET BY MOUTH DAILY.  Marland Kitchen loratadine (CLARITIN) 10 MG tablet Take 1 tablet (10 mg total) by mouth daily. (Patient taking differently: Take 10 mg by mouth daily as needed for allergies. )  . montelukast (SINGULAIR) 10 MG tablet Take 10 mg by mouth daily as needed (for allergies.).   Marland Kitchen Multiple Vitamin (MULTIVITAMIN WITH MINERALS) TABS tablet Take 1 tablet by mouth daily.   . Multiple Vitamins-Minerals (EMERGEN-C IMMUNE PO) Take by mouth as needed.  . Multiple Vitamins-Minerals (ZINC PO) Take 1 tablet by mouth daily.   . Omega-3 Fatty Acids (FISH OIL) 1000 MG CAPS Take 1,000 mg by mouth 2 (two) times daily.   . phenylephrine (SUDAFED PE) 10 MG TABS tablet Take 10 mg by mouth every 4 (four) hours as needed.  Marland Kitchen Phenylephrine-Pheniramine-DM (THERAFLU COLD & COUGH PO) Take by mouth as needed.  . Probiotic Product (PROBIOTIC DAILY PO) Take by mouth 2 (two) times daily.   Marland Kitchen senna (SENOKOT) 8.6 MG TABS tablet Take 1 tablet (8.6 mg total) by mouth 2 (two) times daily. (Patient taking differently: Take 1 tablet by mouth 2 (two) times daily. Patient takes prn)      Allergies:   Patient has no known allergies.   Social History   Socioeconomic History  . Marital status: Married    Spouse name: Not on file  . Number of children: Not on file  . Years of education: Not on file  . Highest education level: Not on file  Occupational History  . Not on file  Social Needs  . Financial resource strain: Not on file  . Food insecurity:    Worry: Not on file    Inability: Not on file  . Transportation needs:    Medical: Not on file    Non-medical: Not on file  Tobacco Use  . Smoking status: Never Smoker  . Smokeless tobacco: Never Used  Substance and Sexual Activity  . Alcohol use: Yes    Comment: social  . Drug use: No  . Sexual activity: Not on file    Comment: BTL  Lifestyle  . Physical activity:    Days per  week: Not on file    Minutes per session: Not on file  . Stress: Not on file  Relationships  . Social connections:    Talks on phone: Not on file    Gets together: Not on file    Attends religious service: Not on file    Active member of club or organization: Not on file    Attends meetings of clubs or organizations: Not on file    Relationship status: Not on file  Other Topics Concern  . Not on file  Social History Narrative  . Not on file     Family History:  The patient's family history includes Breast cancer (age of onset: 40) in her paternal grandmother; Other in her mother.   ROS:   Please see the history of present illness.    ROS All other systems reviewed and are negative.  No flowsheet data found.     PHYSICAL EXAM:   VS:  BP 125/83   Pulse 92   Ht 5' (1.524 m)   Wt 259 lb (117.5 kg)   BMI 50.58 kg/m    GEN: Well nourished, well developed, in no acute distress  HEENT: normal  Neck: no JVD, carotid bruits, or masses Cardiac: RRR; no murmurs, rubs, or gallops,no edema.  Intact distal pulses bilaterally.  Respiratory:  clear to auscultation bilaterally, normal work of breathing GI: soft, nontender,  nondistended, + BS MS: no deformity or atrophy  Skin: warm and dry, no rash Neuro:  Alert and Oriented x 3, Strength and sensation are intact Psych: euthymic mood, full affect  Wt Readings from Last 3 Encounters:  08/17/18 259 lb (117.5 kg)  07/27/18 257 lb 5 oz (116.7 kg)  07/16/18 250 lb (113.4 kg)      Studies/Labs Reviewed:   EKG:  EKG is not ordered today.    Recent Labs: 07/26/2018: ALT 17; BUN 15; Creatinine 1.09; Hemoglobin 11.6; Magnesium 1.7; Platelet Count 267; Potassium 3.8; Sodium 138   Lipid Panel No results found for: CHOL, TRIG, HDL, CHOLHDL, VLDL, LDLCALC, LDLDIRECT  Additional studies/ records that were reviewed today include:  Office notes, 2D echo and EKG.    ASSESSMENT:    1. Abnormal EKG   2. Malignant neoplasm of upper-inner quadrant of left breast in female, estrogen receptor positive ([place])   3. Benign essential HTN      PLAN:  In order of problems listed above:  1.  Abnormal EKG - At her last office visit in January she was noted to have an EKG showing normal sinus rhythm with sinus arrhythmia sinus possible inferior MI probably old with posterior extension.  She then had an EKG done in the oncology office which was completely normal on a different machine showing normal sinus rhythm with no evidence of infarct.  Dr. Lindi Adie with oncology reviewed the EKG and felt there was no need for further cardiology consult but a consult was made anyway.  I do not have access to the initial EKG that was done that showed an abnormality but I reviewed the second EKG done in the clinic and that was completely normal.  She is completely asymptomatic from a cardiac standpoint.  2.  Breast cancer - she is status post chemotherapy with cyclophosphamide and doxorubicin which was completed on 03/24/2017 and was followed by weekly carboplatinum and gemcitabine completed on June 26, 2017.  She is status post left modified radical mastectomy in March 2019.  She underwent  breast reconstruction and postmastectomy radiation.  2D  echo 05/11/2018 showed overall low normal LV function with EF 50 to 55%.  Unfortunately global LV longitudinal strain was inaccurate due to poor acoustical windows.  Definity contrast was recommended to get a better and more accurate LV function assessment but this was not performed.  I am going to refer her back for 2D echo with Definity.  3.  Hypertension -BP is well controlled on exam today.  She has not required any antihypertensive medication.    Medication Adjustments/Labs and Tests Ordered: Current medicines are reviewed at length with the patient today.  Concerns regarding medicines are outlined above.  Medication changes, Labs and Tests ordered today are listed in the Patient Instructions below.  Patient Instructions  Medication Instructions:  Your physician recommends that you continue on your current medications as directed. Please refer to the Current Medication list given to you today.  If you need a refill on your cardiac medications before your next appointment, please call your pharmacy.   Lab work: None If you have labs (blood work) drawn today and your tests are completely normal, you will receive your results only by: Marland Kitchen MyChart Message (if you have MyChart) OR . A paper copy in the mail If you have any lab test that is abnormal or we need to change your treatment, we will call you to review the results.  Testing/Procedures: Your physician has requested that you have an echocardiogram. Echocardiography is a painless test that uses sound waves to create images of your heart. It provides your doctor with information about the size and shape of your heart and how well your heart's chambers and valves are working. This procedure takes approximately one hour. There are no restrictions for this procedure.  Follow-Up: As needed, following results.        Signed, Fransico Him, MD  08/17/2018 2:08 PM    [facility] [address], [place], [place]  [address] Phone: [phone]; Fax: [phone]

## 2018-08-17 NOTE — Patient Instructions (Signed)
Medication Instructions:  Your physician recommends that you continue on your current medications as directed. Please refer to the Current Medication list given to you today.  If you need a refill on your cardiac medications before your next appointment, please call your pharmacy.   Lab work: None If you have labs (blood work) drawn today and your tests are completely normal, you will receive your results only by: Marland Kitchen MyChart Message (if you have MyChart) OR . A paper copy in the mail If you have any lab test that is abnormal or we need to change your treatment, we will call you to review the results.  Testing/Procedures: Your physician has requested that you have an echocardiogram. Echocardiography is a painless test that uses sound waves to create images of your heart. It provides your doctor with information about the size and shape of your heart and how well your heart's chambers and valves are working. This procedure takes approximately one hour. There are no restrictions for this procedure.  Follow-Up: As needed, following results.

## 2018-08-19 ENCOUNTER — Telehealth: Payer: Self-pay | Admitting: *Deleted

## 2018-08-19 DIAGNOSIS — Z170 Estrogen receptor positive status [ER+]: Principal | ICD-10-CM

## 2018-08-19 DIAGNOSIS — C50812 Malignant neoplasm of overlapping sites of left female breast: Secondary | ICD-10-CM

## 2018-08-19 NOTE — Telephone Encounter (Signed)
LVM to remind her about her lab appointment tomorrow morning and study visit on Monday 08/23/18. Lab tomorrow morning at 9 am and visit with [name], Research RN and Injection on Monday at 10:30 am for Cycle 4. Instructed patient to fast x 8 hours prior to lab tomorrow at 9 am.  Instructed patient to not take Letrozole until after her blood draw tomorrow. Also instructed patient to not take Letrozole at home on Monday. Bring meds and medication bottles to clinic along with completed medication diary on Monday.  Do not start next cycle of Ribociclib or Letrozole until instructed on Monday.  Asked patient to please call research nurse if any questions. Foye Spurling, BSN, RN Clinical Research Nurse 08/19/2018 12:05 PM

## 2018-08-20 ENCOUNTER — Inpatient Hospital Stay: Payer: 59 | Attending: Adult Health

## 2018-08-20 ENCOUNTER — Other Ambulatory Visit: Payer: Self-pay

## 2018-08-20 DIAGNOSIS — C50812 Malignant neoplasm of overlapping sites of left female breast: Secondary | ICD-10-CM | POA: Insufficient documentation

## 2018-08-20 DIAGNOSIS — Z006 Encounter for examination for normal comparison and control in clinical research program: Secondary | ICD-10-CM | POA: Insufficient documentation

## 2018-08-20 DIAGNOSIS — C773 Secondary and unspecified malignant neoplasm of axilla and upper limb lymph nodes: Secondary | ICD-10-CM | POA: Insufficient documentation

## 2018-08-20 DIAGNOSIS — Z5111 Encounter for antineoplastic chemotherapy: Secondary | ICD-10-CM | POA: Insufficient documentation

## 2018-08-20 DIAGNOSIS — Z170 Estrogen receptor positive status [ER+]: Secondary | ICD-10-CM

## 2018-08-20 LAB — CBC WITH DIFFERENTIAL (CANCER CENTER ONLY)
Abs Immature Granulocytes: 0.01 10*3/uL (ref 0.00–0.07)
Basophils Absolute: 0 10*3/uL (ref 0.0–0.1)
Basophils Relative: 1 %
Eosinophils Absolute: 0 10*3/uL (ref 0.0–0.5)
Eosinophils Relative: 1 %
HEMATOCRIT: 34.7 % — AB (ref 36.0–46.0)
Hemoglobin: 11.1 g/dL — ABNORMAL LOW (ref 12.0–15.0)
IMMATURE GRANULOCYTES: 0 %
Lymphocytes Relative: 23 %
Lymphs Abs: 0.8 10*3/uL (ref 0.7–4.0)
MCH: 27.9 pg (ref 26.0–34.0)
MCHC: 32 g/dL (ref 30.0–36.0)
MCV: 87.2 fL (ref 80.0–100.0)
MONOS PCT: 12 %
Monocytes Absolute: 0.4 10*3/uL (ref 0.1–1.0)
Neutro Abs: 2.1 10*3/uL (ref 1.7–7.7)
Neutrophils Relative %: 63 %
Platelet Count: 262 10*3/uL (ref 150–400)
RBC: 3.98 MIL/uL (ref 3.87–5.11)
RDW: 16.2 % — AB (ref 11.5–15.5)
WBC Count: 3.3 10*3/uL — ABNORMAL LOW (ref 4.0–10.5)
nRBC: 0 % (ref 0.0–0.2)

## 2018-08-20 LAB — LACTATE DEHYDROGENASE: LDH: 192 U/L (ref 98–192)

## 2018-08-20 LAB — GAMMA GT: GGT: 21 U/L (ref 7–50)

## 2018-08-20 LAB — CMP (CANCER CENTER ONLY)
ALBUMIN: 3.6 g/dL (ref 3.5–5.0)
ALT: 15 U/L (ref 0–44)
AST: 17 U/L (ref 15–41)
Alkaline Phosphatase: 114 U/L (ref 38–126)
Anion gap: 11 (ref 5–15)
BUN: 10 mg/dL (ref 6–20)
CO2: 25 mmol/L (ref 22–32)
CREATININE: 0.93 mg/dL (ref 0.44–1.00)
Calcium: 9.3 mg/dL (ref 8.9–10.3)
Chloride: 103 mmol/L (ref 98–111)
GFR, Est AFR Am: >60 mL/min (ref >60)
GFR, Estimated: >60 mL/min (ref >60)
Glucose, Bld: 97 mg/dL (ref 70–99)
Potassium: 3.8 mmol/L (ref 3.5–5.1)
Sodium: 139 mmol/L (ref 135–145)
Total Bilirubin: 0.4 mg/dL (ref 0.3–1.2)
Total Protein: 7.6 g/dL (ref 6.5–8.1)

## 2018-08-20 LAB — MAGNESIUM: Magnesium: 1.7 mg/dL (ref 1.7–2.4)

## 2018-08-20 LAB — BILIRUBIN, DIRECT: Bilirubin, Direct: <0.1 mg/dL (ref 0.0–0.2)

## 2018-08-20 LAB — PHOSPHORUS: Phosphorus: 3.6 mg/dL (ref 2.5–4.6)

## 2018-08-20 LAB — LIPASE, BLOOD: Lipase: 25 U/L (ref 11–51)

## 2018-08-20 LAB — AMYLASE: AMYLASE: 57 U/L (ref 28–100)

## 2018-08-20 LAB — URIC ACID: Uric Acid, Serum: 4 mg/dL (ref 2.5–7.1)

## 2018-08-20 LAB — RESEARCH LABS

## 2018-08-23 ENCOUNTER — Inpatient Hospital Stay: Payer: 59

## 2018-08-23 ENCOUNTER — Encounter: Payer: Self-pay | Admitting: Adult Health

## 2018-08-23 ENCOUNTER — Ambulatory Visit: Payer: 59

## 2018-08-23 ENCOUNTER — Telehealth: Payer: Self-pay | Admitting: Oncology

## 2018-08-23 ENCOUNTER — Encounter: Payer: Self-pay | Admitting: *Deleted

## 2018-08-23 ENCOUNTER — Inpatient Hospital Stay (HOSPITAL_BASED_OUTPATIENT_CLINIC_OR_DEPARTMENT_OTHER): Payer: 59 | Admitting: Adult Health

## 2018-08-23 ENCOUNTER — Other Ambulatory Visit: Payer: Self-pay

## 2018-08-23 VITALS — BP 140/78 | HR 83 | Temp 98.4°F | Resp 18 | Ht 60.0 in | Wt 253.4 lb

## 2018-08-23 DIAGNOSIS — Z006 Encounter for examination for normal comparison and control in clinical research program: Secondary | ICD-10-CM | POA: Diagnosis not present

## 2018-08-23 DIAGNOSIS — C50812 Malignant neoplasm of overlapping sites of left female breast: Secondary | ICD-10-CM

## 2018-08-23 DIAGNOSIS — Z170 Estrogen receptor positive status [ER+]: Secondary | ICD-10-CM

## 2018-08-23 DIAGNOSIS — C773 Secondary and unspecified malignant neoplasm of axilla and upper limb lymph nodes: Secondary | ICD-10-CM

## 2018-08-23 DIAGNOSIS — Z9012 Acquired absence of left breast and nipple: Secondary | ICD-10-CM

## 2018-08-23 DIAGNOSIS — Z5111 Encounter for antineoplastic chemotherapy: Secondary | ICD-10-CM | POA: Diagnosis not present

## 2018-08-23 DIAGNOSIS — Z803 Family history of malignant neoplasm of breast: Secondary | ICD-10-CM

## 2018-08-23 DIAGNOSIS — Z79811 Long term (current) use of aromatase inhibitors: Secondary | ICD-10-CM

## 2018-08-23 DIAGNOSIS — C50912 Malignant neoplasm of unspecified site of left female breast: Secondary | ICD-10-CM

## 2018-08-23 DIAGNOSIS — C50212 Malignant neoplasm of upper-inner quadrant of left female breast: Secondary | ICD-10-CM

## 2018-08-23 MED ORDER — GOSERELIN ACETATE 3.6 MG ~~LOC~~ IMPL
DRUG_IMPLANT | SUBCUTANEOUS | Status: AC
  Filled 2018-08-23: qty 3.6

## 2018-08-23 MED ORDER — GOSERELIN ACETATE 3.6 MG ~~LOC~~ IMPL
3.6000 mg | DRUG_IMPLANT | Freq: Once | SUBCUTANEOUS | Status: AC
  Administered 2018-08-23: 3.6 mg via SUBCUTANEOUS

## 2018-08-23 NOTE — Patient Instructions (Signed)
Goserelin injection What is this medicine? GOSERELIN (GOE se rel in) is similar to a hormone found in the body. It lowers the amount of sex hormones that the body makes. Men will have lower testosterone levels and women will have lower estrogen levels while taking this medicine. In men, this medicine is used to treat prostate cancer; the injection is either given once per month or once every 12 weeks. A once per month injection (only) is used to treat women with endometriosis, dysfunctional uterine bleeding, or advanced breast cancer. This medicine may be used for other purposes; ask your health care provider or pharmacist if you have questions. COMMON BRAND NAME(S): Zoladex What should I tell my health care provider before I take this medicine? They need to know if you have any of these conditions (some only apply to women): -diabetes -heart disease or previous heart attack -high blood pressure -high cholesterol -kidney disease -osteoporosis or low bone density -problems passing urine -spinal cord injury -stroke -tobacco smoker -an unusual or allergic reaction to goserelin, hormone therapy, other medicines, foods, dyes, or preservatives -pregnant or trying to get pregnant -breast-feeding How should I use this medicine? This medicine is for injection under the skin. It is given by a health care professional in a hospital or clinic setting. Men receive this injection once every 4 weeks or once every 12 weeks. Women will only receive the once every 4 weeks injection. Talk to your pediatrician regarding the use of this medicine in children. Special care may be needed. Overdosage: If you think you have taken too much of this medicine contact a poison control center or emergency room at once. NOTE: This medicine is only for you. Do not share this medicine with others. What if I miss a dose? It is important not to miss your dose. Call your doctor or health care professional if you are unable to  keep an appointment. What may interact with this medicine? -female hormones like estrogen -herbal or dietary supplements like black cohosh, chasteberry, or DHEA -female hormones like testosterone -prasterone This list may not describe all possible interactions. Give your health care provider a list of all the medicines, herbs, non-prescription drugs, or dietary supplements you use. Also tell them if you smoke, drink alcohol, or use illegal drugs. Some items may interact with your medicine. What should I watch for while using this medicine? Visit your doctor or health care professional for regular checks on your progress. Your symptoms may appear to get worse during the first weeks of this therapy. Tell your doctor or healthcare professional if your symptoms do not start to get better or if they get worse after this time. Your bones may get weaker if you take this medicine for a long time. If you smoke or frequently drink alcohol you may increase your risk of bone loss. A family history of osteoporosis, chronic use of drugs for seizures (convulsions), or corticosteroids can also increase your risk of bone loss. Talk to your doctor about how to keep your bones strong. This medicine should stop regular monthly menstration in women. Tell your doctor if you continue to menstrate. Women should not become pregnant while taking this medicine or for 12 weeks after stopping this medicine. Women should inform their doctor if they wish to become pregnant or think they might be pregnant. There is a potential for serious side effects to an unborn child. Talk to your health care professional or pharmacist for more information. Do not breast-feed an infant while taking   this medicine. Men should inform their doctors if they wish to father a child. This medicine may lower sperm counts. Talk to your health care professional or pharmacist for more information. What side effects may I notice from receiving this  medicine? Side effects that you should report to your doctor or health care professional as soon as possible: -allergic reactions like skin rash, itching or hives, swelling of the face, lips, or tongue -bone pain -breathing problems -changes in vision -chest pain -feeling faint or lightheaded, falls -fever, chills -pain, swelling, warmth in the leg -pain, tingling, numbness in the hands or feet -signs and symptoms of low blood pressure like dizziness; feeling faint or lightheaded, falls; unusually weak or tired -stomach pain -swelling of the ankles, feet, hands -trouble passing urine or change in the amount of urine -unusually high or low blood pressure -unusually weak or tired Side effects that usually do not require medical attention (report to your doctor or health care professional if they continue or are bothersome): -change in sex drive or performance -changes in breast size in both males and females -changes in emotions or moods -headache -hot flashes -irritation at site where injected -loss of appetite -skin problems like acne, dry skin -vaginal dryness This list may not describe all possible side effects. Call your doctor for medical advice about side effects. You may report side effects to FDA at 1-800-FDA-1088. Where should I keep my medicine? This drug is given in a hospital or clinic and will not be stored at home. NOTE: This sheet is a summary. It may not cover all possible information. If you have questions about this medicine, talk to your doctor, pharmacist, or health care provider.  2019 Elsevier/Gold Standard (2013-08-02 11:10:35)  

## 2018-08-23 NOTE — Research (Signed)
Assessment for C4D1OF [name]: Patient into clinic by herself this morning to complete study activities.  Labs:  Fasting labs completed on 08/20/18. All results fall within study parameters to continue treatment without dose adjustment. Dr. Jana Hakim  reviewed labs and agrees they are adequate to continue treatment with Ribociclib. VS: Completed per protocol. BP taken after patient resting x 5 minutes and weight obtained without shoes or heavy clothing.  Con Meds: Reviewed with patient. Medication list is up to date. Reminded patient to notify research nurse of any new medications prior to taking them unless it is an emergency. She verbalized understanding.  History & Physical: Completed by [name], NP.  See provider encounter note for today's date.  AEs: Reviewed with patient by this research RN and [name]. See AE table below. Blurred vision- Patient states she was examined by her eye doctor and told she might have a small splinter or hemorrhage in her eye. She was referred to Retina specialist at the end of this month. Records will be requested from [facility] office.  ECG:  Not required for today's visit.  Study Drug:Patient states she missed one dose of Ribociclib & Letrozole this cycle due to forgetting.  Patient returned completed medication diary for Cycle 3 which reflects the 1 missed dose of scheduled Ribociclib and Letrozole.  Patient returned bottle of Ribociclib kit # [id number] with 37 pills left in bottle, which is the expected number of pills remaining for patient taking 2 daily for 19 days this cycle (started cycle on day 2 and missed 1 dose). IRT assigned kit #[id number]  for cycle 4 Ribociclib.  Dispensed by Raul Del, Pharmacist. Double checked patients name, DOB, Study ID #, Kit#, expiration date and dose.  Ribociclib then given to patient along with medication diary for cycle 4 Ribociclib and Letrozole. Patient took dose of Ribociclib and Letrozole in clinic  today at 11:55 AM and documented in her medication diary. Goserelin injection given by Injection nurse today after study visit.   AE Table: Event Grade Onset Date End Date Status Comments Attribution to Ribociclib Attribution to Letrozole Attribution to Goserelin  Allergic Rhinitis 2 2012  ongoing At Baseline n/a n/a n/a  Anemia 1 2018  ongoing At Baseline n/a n/a n/a  Arthralgias, legs 1 12/2017  ongoing At Baseline n/a n/a n/a  Constipation 1 08/27/17  ongoing At Baseline n/a n/a n/a  Cough, intermittent 2 05/09/18  ongoing At Baseline n/a n/a n/a  GERD 1 2018  intermittent At Baseline n/a n/a n/a  Gluten Sensitivity 1 12/2017  ongoing At Baseline n/a n/a n/a  Headaches 1 02/2017  ongoing At Baseline n/a n/a n/a  Hot Flashes 2 03/2017  ongoing At Baseline n/a n/a n/a  Hyperglycemia 1 2018  ongoing At Baseline n/a n/a n/a  Hyperlipidemia 1 03/2016  ongoing At Baseline n/a n/a n/a  Vitamin D Deficiency 2 05/2015  ongoing At Baseline n/a n/a n/a  Hypertension, intermittent 1 06/14/18 06/28/18 resolved Grade 2 at baseline n/a n/a n/a  Hypertension 1 07/12/18 08/23/18 resolved recurred No No No  Hypertension 2 08/23/18  ongoing increased No No No  Fatigue 1 06/28/18  ongoing   Yes No No  Blurred Vision 1 07/14/18  ongoing  No No No  Nausea 1 07/19/18 07/27/18 resolved  Yes No No  Diarrhea, intermittent 1 07/13/18 07/27/18 resolved  Yes No No  Decreased WBC 1 06/25/18 07/09/18 resolved  Yes No No  Decreased WBC 1 08/20/18  ongoing  Yes No  No   Breast reconstruction surgery is scheduled for 09/15/18 which will be day 24 of this cycle.  Dr. Jana Hakim instructs no need to hold Ribociclib prior to surgery since surgery will fall on her week off as recommended by study protocol.  No need to hold Letrozole for surgery.  Patient will return for study visit the week after her surgery for Cycle 5 visit. Dr. Jana Hakim will assess at that time if patient ok to start cycle 5.  Patient understands she will be asked  to come for study visit at that time and then may be asked to return a week or two later depending on how she is feeling post-op.  Thanked patient for her participation today.  Reviewed schedule for next cycle with fasting lab on Friday 4/10 and study visit on Monday 4/13 for C5D1.  Instructed patient to call for any questions or concerns prior to next visit. She verbalized understanding.   Foye Spurling, BSN, RN Clinical Research Nurse 08/23/2018 12:00PM

## 2018-08-23 NOTE — Telephone Encounter (Signed)
Gave avs and calendar ° °

## 2018-08-23 NOTE — Progress Notes (Signed)
[place]  [phone] [phone]     ID: Mackenzie [name] DOB: January 27, 1971  MR#: [id number]  OLM#:[id number]  Patient Care Team: Willey Blade, MD as PCP - General (Internal Medicine) Magrinat, [name], MD as Consulting Physician (Oncology) Erroll Luna, MD as Consulting Physician (General Surgery) Kyung Rudd, MD as Consulting Physician (Radiation Oncology) Dillingham, Loel Lofty, DO as Attending Physician (Plastic Surgery) Cathlean Cower, RN as Registered Nurse Barrington Ellison, RN as [facility] OTHER MD:  CHIEF COMPLAINT: Estrogen receptor positive breast cancer  CURRENT TREATMENT: letrozole, goserelin; on [name] trial   HISTORY OF CURRENT ILLNESS: From the original intake note:  The patient herself noted some changes in her left breast late July 2018, she says, and eventually brought this to medical attention so that on 01/09/2017 she underwent bilateral diagnostic mammography with tomography and left breast ultrasonography at the breast Center. This found the breast density to be category C. In the upper inner quadrant of the left breast there was an area of asymmetry and there were malignant type calcifications involving all 4 quadrants. On exam there is firmness and palpable thickening in the anterior left breast with skin dimpling. Ultrasonography found at the 9:30 o'clock radiant 3 cm from the nipple a 2.7 cm mass and in the left axilla for abnormal lymph nodes largest of which measured 2.7 cm.  Biopsy of the left breast 9:30 o'clock mass 80 01/26/2017 showed (SAA [id number]) invasive ductal carcinoma, with extracellular mucin, grade 1 or 2. In the lower outer left breast is separated biopsy the same day showed ductal carcinoma in situ. One of the 4 lymph nodes involved was positive for metastatic carcinoma. Prognostic panel on the invasive disease showed it to be estrogen receptor 100% positive,  progesterone receptor 20% positive, both with strong staining intensity, with an MIB-1 of 20%, and no HER-2 amplification with a signals ratio 1.38 and the number per cell 2.90.  On 01/22/2017 the patient underwent bilateral breast MRI. This showed no involvement of the right breast, but in the left breast there was a masslike and non-masslike enhancement involving all quadrants, with skin swelling but no abnormal enhancement of the skin or nipple areolar complex or pectoralis muscle. The mass could not be clearly measured but spanned approximately 12 cm. There was bulky left axillary lymphadenopathy, with the largest lymph node measuring up to 3.4 cm.  The patient's subsequent history is as detailed below.  INTERVAL HISTORY: Mackenzie [name] returns today for follow-up of her estrogen receptor positive breast cancer. She is here to start cycle 4 day 1 of the [name] clinical trial.    The patient continues on letrozole, which she is tolerating well. She denies hot flashes, arthralgias, vaginal dryness.    She also receives Goserelin every 4 weeks which she is tolerating well.    She starts on Ribociclib today.  She notes some mild fatigue with this.  She is still active and working 8 hour days about 3 days per week.  This varies depending on her appointments and the hours can range 6 hours to 10 hours.    Since her last visit, Mackenzie [name] underwent cardiology evaluation at the recommendation of Dr. Lindi Adie due to an abnormal EKG noted on study.  Our EKG was subsequently normal.  The study EKG was emailed to Dr. Radford Pax.  She did recommend a repeat echo with definity enhancement to better estimate Mackenzie [name]'s EF from 05/2018.  This is scheduled for 08/27/2018.  REVIEW OF  SYSTEMS: Mackenzie [name] notes joint stiffness in her legs in particular after periods of inactivity.  She is exercising with walking on the treadmill for 30 minutes at a time, the stepper for 15 minutes, or the stationary bike for 15 minutes. She typically  combines 2 of these twice per week.  Additionally she is working with a Insurance underwriter after these times of exercise, and she is working on adding another day.  Mackenzie [name] went to her eye doctor about her blurred vision.  She was noted to have splinter object in her eye that could possibly be a hemorrhage or a small aneurysm.  Mackenzie [name] was referred to a retina specialist.  She notes the blurred vision is not any worse, and may be slightly improved from last visit.  She notes her intermittent nausea is improved.  No diarrhea or constipation.    Her husband worked as a Building control surveyor and lost his job this morning.  She has some stress related to financial concerns and their impact of her due to this.    Mackenzie [name] hasn't had fever, chills, cough, shortness of breath, chest pain or palpitations.  A detailed ROS wa sotherwise non contributory.     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, left ([facility]) 01/23/2017   Diabetes mellitus 2012   GDM   GERD (gastroesophageal reflux disease) 2018   Hot flashes 03/2017   Hyperlipidemia 03/20/2016   Hypertension 2012   Morbid obesity with body mass index (BMI) of 40.0 to 49.9 (HCC) 09/17/2017   NCGS (non-celiac gluten sensitivity)    Vitamin D deficiency 05/2015    PAST SURGICAL HISTORY: Past Surgical History:  Procedure Laterality Date   BREAST RECONSTRUCTION WITH PLACEMENT OF TISSUE EXPANDER AND FLEX HD (ACELLULAR HYDRATED DERMIS) Left 08/27/2017   Procedure: LEFT BREAST RECONSTRUCTION WITH PLACEMENT OF TISSUE EXPANDER AND FLEX HD;  Surgeon: Wallace Going, DO;  Location: [facility];  Service: Plastics;  Laterality: Left;   CESAREAN SECTION     x2   MASTECTOMY MODIFIED RADICAL Left  08/27/2017   MASTECTOMY MODIFIED RADICAL Left 08/27/2017   Procedure: LEFT MODIFIED RADICAL MASTECTOMY;  Surgeon: Erroll Luna, MD;  Location: [facility];  Service: General;  Laterality: Left;   PORT-A-CATH REMOVAL Right 08/27/2017   Procedure: REMOVAL PORT-A-CATH RIGHT CHEST;  Surgeon: Erroll Luna, MD;  Location: [facility];  Service: General;  Laterality: Right;   PORTACATH PLACEMENT Right 01/28/2017   Procedure: INSERTION PORT-A-CATH WITH ULTRASOUND;  Surgeon: Erroll Luna, MD;  Location: [facility];  Service: General;  Laterality: Right;   TUBAL LIGATION Bilateral 01/21/2011    FAMILY HISTORY Family History  Problem Relation Age of Onset   Breast cancer Paternal Grandmother 64       d.60s from breast cancer. Did not have treatment.   Other Mother        O.29 from complications of surgery to remove brain tumor   The patient's father still alive at age 11. The patient's mother died with a brain tumor which the patient says was "benign". She was 56. The patient has one brother, no sisters. The only breast cancer in the family is a paternal grandmother who died from breast cancer at an unknown age. There is  no history of ovarian or prostate cancer in the family.   GYNECOLOGIC HISTORY:  No LMP recorded. (Menstrual status: Chemotherapy). Menarche age 20 and first live birth age 46 she is [place] P4. The person is still having regular periods as of August 2018   SOCIAL HISTORY: (Updated 05/31/2018) Mackenzie [name] works at [employer] on the fifth floor, [place]. She is separated from her husband (as of 05/2018), Mackenzie [name], who is a Building control surveyor. Daughter is married and lives in [place] and works as a Education administrator. Taquana's daughter, Mackenzie [name], lives in [place] and works in the police department. Daughters, [place] and [place] are 17 and 6, living at home. The patient has no grandchildren. She attends a [employer].     ADVANCED DIRECTIVES: Not in place   HEALTH  MAINTENANCE: Social History   Tobacco Use   Smoking status: Never Smoker   Smokeless tobacco: Never Used  Substance Use Topics   Alcohol use: Yes    Comment: social   Drug use: No    Colonoscopy: Never  PAP: December 2017  Bone density: Never   No Known Allergies  Current Outpatient Medications  Medication Sig Dispense Refill   acetaminophen (TYLENOL) 500 MG tablet Take 1,000 mg by mouth every 6 (six) hours as needed (for pain/headaches.).      B Complex Vitamins (VITAMIN B COMPLEX PO) Take 1 capsule by mouth 2 (two) times daily.      benzonatate (TESSALON) 100 MG capsule Take 100 mg by mouth every 8 (eight) hours as needed for cough.   0   budesonide-formoterol (SYMBICORT) 80-4.5 MCG/ACT inhaler Inhale 2 puffs into the lungs 2 (two) times daily.      Calcium Carb-Cholecalciferol (CALCIUM 600+D3 PO) Take 1 tablet by mouth 2 (two) times daily.      cetirizine (ZYRTEC) 10 MG tablet Take 10 mg by mouth daily.      Cholecalciferol (VITAMIN D-3) 5000 units TABS Take 5,000 Units by mouth 2 (two) times daily.      dextromethorphan-guaiFENesin (MUCINEX DM) 30-600 MG 12hr tablet Take 1 tablet by mouth as needed for cough.     gabapentin (NEURONTIN) 300 MG capsule Take 1 capsule (300 mg total) by mouth at bedtime. (Patient taking differently: Take 300 mg by mouth at bedtime. ) 90 capsule 4   ibuprofen (ADVIL,MOTRIN) 200 MG tablet Take 800 mg by mouth every 8 (eight) hours as needed for mild pain (for pain.).      Investigational ribociclib (KISQALI) 200 MG tablet [name] Take 2 tablets (400 mg total) by mouth daily. then 7 days off. 75 tablet 0   letrozole (FEMARA) 2.5 MG tablet TAKE 1 TABLET BY MOUTH DAILY. 30 tablet 5   loratadine (CLARITIN) 10 MG tablet Take 1 tablet (10 mg total) by mouth daily. (Patient taking differently: Take 10 mg by mouth daily as needed for allergies. ) 90 tablet 2   montelukast (SINGULAIR) 10 MG tablet Take 10 mg by mouth daily as needed (for  allergies.).      Multiple Vitamin (MULTIVITAMIN WITH MINERALS) TABS tablet Take 1 tablet by mouth daily.      Multiple Vitamins-Minerals (EMERGEN-C IMMUNE PO) Take by mouth as needed.     Multiple Vitamins-Minerals (ZINC PO) Take 1 tablet by mouth daily.      Omega-3 Fatty Acids (FISH OIL) 1000 MG CAPS Take 1,000 mg by mouth 2 (two) times daily.      phenylephrine (SUDAFED PE) 10 MG TABS tablet Take 10 mg by mouth every 4 (four)  hours as needed.     Probiotic Product (PROBIOTIC DAILY PO) Take by mouth 2 (two) times daily.      senna (SENOKOT) 8.6 MG TABS tablet Take 1 tablet (8.6 mg total) by mouth 2 (two) times daily. (Patient taking differently: Take 1 tablet by mouth 2 (two) times daily. Patient takes prn) 120 each 0   No current facility-administered medications for this visit.    Facility-Administered Medications Ordered in Other Visits  Medication Dose Route Frequency Provider Last Rate Last Dose   sodium chloride flush (NS) 0.9 % injection 10 mL  10 mL Intravenous PRN Magrinat, [name], MD   10 mL at 03/10/17 1239    OBJECTIVE:  Vitals:   08/23/18 1043  BP: 140/78  Pulse: 83  Resp: 18  Temp: 98.4 F (36.9 C)  SpO2: 100%     Body mass index is 49.49 kg/m.   Wt Readings from Last 3 Encounters:  08/23/18 253 lb 6.4 oz (114.9 kg)  08/17/18 259 lb (117.5 kg)  07/27/18 257 lb 5 oz (116.7 kg)   ECOG FS: 1 - Symptomatic but completely ambulatory GENERAL: Patient is a well appearing female in no acute distress HEENT:  Sclerae anicteric.  Oropharynx clear and moist. No ulcerations or evidence of oropharyngeal candidiasis. Neck is supple.  NODES:  No cervical, supraclavicular, or axillary lymphadenopathy palpated.  BREAST EXAM:  Right breast benign, left breast s/p mastectomy and radiation, with expander in place, no sign of local recurrence LUNGS:  Clear to auscultation bilaterally.  No wheezes or rhonchi. HEART:  Regular rate and rhythm. No murmur appreciated. ABDOMEN:   Soft, nontender.  Positive, normoactive bowel sounds. No organomegaly palpated. MSK:  No focal spinal tenderness to palpation. Full range of motion bilaterally in the upper extremities. EXTREMITIES:  No peripheral edema.   SKIN:  Clear with no obvious rashes or skin changes. No nail dyscrasia. NEURO:  Nonfocal. Well oriented.  Appropriate affect.    LAB RESULTS:    No visits with results within 3 Day(s) from this visit.  Latest known visit with results is:  Appointment on 08/20/2018  Component Date Value Ref Range Status   Research Labs 08/20/2018 COLLECTED BY LABORATORY   Final   Performed at [facility], [address], [place], [place] [address]   GGT 08/20/2018 21  7 - 50 U/L Final   Performed at [facility], [place] [address] [place], [place] [address]   Lipase 08/20/2018 25  11 - 51 U/L Final   Performed at [facility], [place] [address] [place], [place] [address]   Amylase 08/20/2018 57  28 - 100 U/L Final   Performed at [facility], [place] [address] [place], [place] [address]   LDH 08/20/2018 192  98 - 192 U/L Final   Performed at [facility], [place] [address] [place], [place] [address]   Bilirubin, Direct 08/20/2018 <0.1  0.0 - 0.2 mg/dL Final   Performed at [facility] [address] [place], [place] [address]   Uric Acid, Serum 08/20/2018 4.0  2.5 - 7.1 mg/dL Final   Performed at [facility], [place] [address], [place], [place] [address]   Phosphorus 08/20/2018 3.6  2.5 - 4.6 mg/dL Final   Performed at [place] [address], [place], [place] [address]   Magnesium 08/20/2018 1.7  1.7 - 2.4 mg/dL Final   Performed at [facility], [place] [address], [place], [place] [address]   Sodium 08/20/2018 139  135 - 145 mmol/L Final   Potassium 08/20/2018 3.8  3.5 - 5.1 mmol/L Final    Chloride 08/20/2018 103  98 - 111 mmol/L Final   CO2 08/20/2018 25  22 - 32 mmol/L Final   Glucose, Bld 08/20/2018 97  70 - 99 mg/dL Final   BUN 08/20/2018 10  6 - 20 mg/dL Final   Creatinine 08/20/2018 0.93  0.44 - 1.00 mg/dL Final   Calcium 08/20/2018 9.3  8.9 - 10.3 mg/dL Final   Total Protein 08/20/2018 7.6  6.5 - 8.1 g/dL Final   Albumin 08/20/2018 3.6  3.5 - 5.0 g/dL Final   AST 08/20/2018 17  15 - 41 U/L Final   ALT 08/20/2018 15  0 - 44 U/L Final   Alkaline Phosphatase 08/20/2018 114  38 - 126 U/L Final   Total Bilirubin 08/20/2018 0.4  0.3 - 1.2 mg/dL Final   GFR, Est Non Af Am 08/20/2018 >60  >60 mL/min Final   GFR, Est AFR Am 08/20/2018 >60  >60 mL/min Final   Anion gap 08/20/2018 11  5 - 15 Final   Performed at [facility], [place] [address], [place], [place] [address]   WBC Count 08/20/2018 3.3* 4.0 - 10.5 K/uL Final   RBC 08/20/2018 3.98  3.87 - 5.11 MIL/uL Final   Hemoglobin 08/20/2018 11.1* 12.0 - 15.0 g/dL Final   HCT 08/20/2018 34.7* 36.0 - 46.0 % Final   MCV 08/20/2018 87.2  80.0 - 100.0 fL Final   MCH 08/20/2018 27.9  26.0 - 34.0 pg Final   MCHC 08/20/2018 32.0  30.0 - 36.0 g/dL Final   RDW 08/20/2018 16.2* 11.5 - 15.5 % Final   Platelet Count 08/20/2018 262  150 - 400 K/uL Final   nRBC 08/20/2018 0.0  0.0 - 0.2 % Final   Neutrophils Relative % 08/20/2018 63  % Final   Neutro Abs 08/20/2018 2.1  1.7 - 7.7 K/uL Final   Lymphocytes Relative 08/20/2018 23  % Final   Lymphs Abs 08/20/2018 0.8  0.7 - 4.0 K/uL Final   Monocytes Relative 08/20/2018 12  % Final   Monocytes Absolute 08/20/2018 0.4  0.1 - 1.0 K/uL Final   Eosinophils Relative 08/20/2018 1  % Final   Eosinophils Absolute 08/20/2018 0.0  0.0 - 0.5 K/uL Final   Basophils Relative 08/20/2018 1  % Final   Basophils Absolute 08/20/2018 0.0  0.0 - 0.1 K/uL Final   Immature Granulocytes 08/20/2018 0  % Final   Abs Immature Granulocytes 08/20/2018 0.01   0.00 - 0.07 K/uL Final   Performed at [facility], [place] [address], [place], [place] [address]     STUDIES:   ELIGIBLE FOR AVAILABLE RESEARCH PROTOCOL: [name], ASA study   ASSESSMENT: 48 y.o. [place] woman status post left breast upper inner quadrant biopsy 01/14/2017 for a clinical T3 N2, stage IIA invasive ductal carcinoma, grade 1 or 2, estrogen and progesterone receptor positive, HER-2 nonamplified, with an MIB-1 of 20%.  (1) staging studies: Brain MRI, bone scan, and CT scan of the chest 02/05/2017 showed no brain lesions, no lung or liver lesions, a 4.9 cm mass in the left breast with left axillary and subpectoral adenopathy, and nonspecific bone scan tracer at L2, left scapula, and anterior ribs, with lumbar spine MRI suggested for further evaluation.  (a) lumbar spine MRI 02/17/2017 showed no normal bone lesions.  There was mild lumbar spondylosis  (2) neoadjuvant chemotherapy consisting of cyclophosphamide and doxorubicin in dose dense fashion 4 started 02/10/2017, completed 03/24/2017, followed by weekly carboplatin and gemcitabine given days 1 and 8 of each 21-day cycle starting 04/14/2017, completing the planned 4 cycles 06/26/2017  (3) is post left modified radical mastectomy on 08/27/2017 showing an mpT3 pN2 residual invasive ductal carcinoma, grade 2, with a residual cancer burden of 3.  Margins were clear  (a) TRAM flap reconstruction planned for 09/13/2018  (4) postmastectomy radiation completed 01/01/2018  (a) capecitabine radiosensitization 11/16/2017-01/01/2018  (5) goserelin started 09/21/2017  (a) letrozole started 01/11/2018  (b) enrolled in [place] clinical trial, randomized to Ribociclib on 05/31/2018  (c) Bone density on 03/16/2018 was normal with T score of -0.7 in the L1-L4 spine   (6) genetics testing 03/24/2017 through the Common Hereditary Cancer Panel offered by [facility] found no deleterious mutations in APC, ATM, AXIN2, BARD1,  BMPR1A, BRCA1, BRCA2, BRIP1, CDH1, CDKN2A (p14ARF), CDKN2A (p16INK4a), CHEK2, CTNNA1, DICER1, EPCAM (Deletion/duplication testing only), GREM1 (promoter region deletion/duplication testing only), KIT, MEN1, MLH1, MSH2, MSH3, MSH6, MUTYH, NBN, NF1, NHTL1, PALB2, PDGFRA, PMS2, POLD1, POLE, PTEN, RAD50, RAD51C, RAD51D, SDHB, SDHC, SDHD, SMAD4, SMARCA4. STK11, TP53, TSC1, TSC2, and VHL.  The following genes were evaluated for sequence changes only: SDHA and HOXB13 c.251G>A variant only.    PLAN: Jaylianna is doing well today.  She has no clinical sign of recurrence.  Her labs are stable and we reviewed them in detail.  She was cleared to start her fourth cycle of Ribociclib.  She is tolerating this pretty well.  I reviewed her upcoming surgical date with Dr. Jana Hakim, along with the [facility] recommendations which recommend she continue on Riboclicb, have her surgery in her off week, with lab testing just prior to her surgery.  Dr. Jana Hakim agrees with this approach.  I sent a note to Dr. Marla Roe with this information.  Khristian will continue on her Letrozole daily and will continue to receive her Goserelin injections monthly.  She is tolerating these well.  I congratulated her increased efforts to remain physically active and encouraged her to continue this, particularly in the weeks leading up to her surgery.  Tyjah will return on 09/20/2018 for labs, f/u with Dr. Jana Hakim, and her next Goserelin injection.  She knows to call for any questions or concerns that may arise prior to then.    A total of (30) minutes of face-to-face time was spent with this patient with greater than 50% of that time in counseling and care-coordination.   [name], NP 08/23/18 10:47 AM Medical Oncology and Hematology [facility] [address] [place], [place] [address] Tel. [phone]    Fax. [phone]

## 2018-08-26 ENCOUNTER — Encounter: Payer: Self-pay | Admitting: Oncology

## 2018-08-26 ENCOUNTER — Other Ambulatory Visit: Payer: Self-pay | Admitting: Oncology

## 2018-08-26 ENCOUNTER — Other Ambulatory Visit: Payer: Self-pay | Admitting: *Deleted

## 2018-08-26 DIAGNOSIS — C50912 Malignant neoplasm of unspecified site of left female breast: Secondary | ICD-10-CM

## 2018-08-26 DIAGNOSIS — C773 Secondary and unspecified malignant neoplasm of axilla and upper limb lymph nodes: Principal | ICD-10-CM

## 2018-08-26 DIAGNOSIS — C50812 Malignant neoplasm of overlapping sites of left female breast: Secondary | ICD-10-CM

## 2018-08-26 DIAGNOSIS — Z170 Estrogen receptor positive status [ER+]: Secondary | ICD-10-CM

## 2018-08-26 DIAGNOSIS — C50212 Malignant neoplasm of upper-inner quadrant of left female breast: Secondary | ICD-10-CM

## 2018-08-26 MED ORDER — INV-RIBOCICLIB 200 MG TAB NATALEE TRIO003 STUDY: 400.0000 mg | ORAL_TABLET | Freq: Every day | ORAL | 0 refills | Status: DC

## 2018-08-27 ENCOUNTER — Telehealth: Payer: Self-pay | Admitting: Cardiology

## 2018-08-27 ENCOUNTER — Ambulatory Visit (HOSPITAL_COMMUNITY): Payer: 59

## 2018-08-27 NOTE — Telephone Encounter (Signed)
Echocardiogram-Dr. Turner-08/27/2018.  Coronavirus ECHO reschedule  Left voicemail on phone.  Mackenzie [name] is 48 year old with a previously abnormal interpretation on ECG then subsequent EKG appeared normal in oncology clinic.  Recent echocardiogram in December demonstrated ejection fraction of 50 to 55%.  In review of Dr. Theodosia Blender note, I think it is reasonable to postpone her echocardiogram and this can be scheduled out to greater than 12 weeks, level 3.   Told her that she may contact our office at [phone] with any further questions.  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

## 2018-08-31 ENCOUNTER — Encounter: Payer: Self-pay | Admitting: Plastic Surgery

## 2018-08-31 ENCOUNTER — Ambulatory Visit (INDEPENDENT_AMBULATORY_CARE_PROVIDER_SITE_OTHER): Payer: 59 | Admitting: Plastic Surgery

## 2018-08-31 ENCOUNTER — Other Ambulatory Visit: Payer: Self-pay

## 2018-08-31 VITALS — BP 160/92 | HR 86 | Temp 97.5°F | Ht 67.0 in | Wt 257.0 lb

## 2018-08-31 DIAGNOSIS — Z9012 Acquired absence of left breast and nipple: Secondary | ICD-10-CM

## 2018-08-31 DIAGNOSIS — C50212 Malignant neoplasm of upper-inner quadrant of left female breast: Secondary | ICD-10-CM

## 2018-08-31 DIAGNOSIS — Z170 Estrogen receptor positive status [ER+]: Secondary | ICD-10-CM | POA: Diagnosis not present

## 2018-08-31 NOTE — Progress Notes (Signed)
   Subjective:    Patient ID: Mackenzie [name], female    DOB: [birth year], 48 y.o.   MRN: [id number]  The patient is a 48 year old black female originally here for her history and physical for left breast latissimus reconstruction.  She had a left mastectomy with first stage reconstruction using an expander and ADM.  She was diagnosed with left upper inner quadrant invasive ductal carcinoma.  It was estrogen and progesterone positive.  She had chemotherapy and radiation that ended in July 2019.  She is 5 feet 7 inches tall and weighs 250 pounds.  Her preoperative bra 42 C.  She will need autologous reconstruction due to the radiation.   Review of Systems  Constitutional: Negative.  Negative for activity change and appetite change.  HENT: Negative.   Eyes: Negative.   Respiratory: Negative.  Negative for shortness of breath.   Cardiovascular: Negative.  Negative for leg swelling.  Gastrointestinal: Negative.  Negative for abdominal pain.  Endocrine: Negative.   Genitourinary: Negative.   Musculoskeletal: Negative.   Neurological: Negative.   Hematological: Negative.   Psychiatric/Behavioral: Negative.        Objective:   Physical Exam Vitals signs and nursing note reviewed.  Constitutional:      Appearance: Normal appearance.  HENT:     Head: Normocephalic and atraumatic.     Mouth/Throat:     Mouth: Mucous membranes are moist.  Eyes:     Pupils: Pupils are equal, round, and reactive to light.  Cardiovascular:     Rate and Rhythm: Normal rate.  Pulmonary:     Effort: Pulmonary effort is normal. No respiratory distress.  Abdominal:     General: Abdomen is flat. There is no distension.  Skin:    General: Skin is warm.     Capillary Refill: Capillary refill takes less than 2 seconds.  Neurological:     General: No focal deficit present.     Mental Status: She is alert and oriented to person, place, and time.  Psychiatric:        Mood and Affect: Mood normal.      Behavior: Behavior normal.       Assessment & Plan:  Malignant neoplasm of upper-inner quadrant of left breast in female, estrogen receptor positive (Mackenzie [name])  Acquired absence of left breast   After lengthy discussion the patient has decided that she would rather have a TRAM flap instead of a latissimus flap.  She understands that the recovery is longer and the surgery is longer as well.  Due to the Covid we will get back to her on timing for the surgery.

## 2018-09-01 ENCOUNTER — Telehealth: Payer: Self-pay | Admitting: *Deleted

## 2018-09-01 NOTE — Telephone Encounter (Signed)
Patient called to notify research nurse that her breast reconstruction surgery in April has been canceled due to the Covid-19 outbreak.  Thanked patient for calling.  Agreed to cancel the lab for 09/07/08 which was scheduled to check labs prior to planned surgery.  Reminded patient about lab only on 4/10 and study visit with Dr. Jana Hakim on 4/13.  She verbalized understanding.   Patient asked if research nurse can send message to [name] or Dr. Jana Hakim and collaborative nurse that she needs light duty accomodations extended at work. VM left for Dr. [name] nurse and Susa Raring in managed care department regarding assistance needed for light duty extension at work.  Foye Spurling, BSN, Cabin crew

## 2018-09-02 ENCOUNTER — Other Ambulatory Visit: Payer: Self-pay | Admitting: Oncology

## 2018-09-02 ENCOUNTER — Encounter: Payer: Self-pay | Admitting: Oncology

## 2018-09-08 ENCOUNTER — Encounter: Payer: Self-pay | Admitting: General Practice

## 2018-09-08 ENCOUNTER — Other Ambulatory Visit: Payer: 59

## 2018-09-08 MED FILL — LETROZOLE 2.5 MG TABLET: 2.5 | 30 days supply | Qty: 30 | Fill #0

## 2018-09-08 MED FILL — GABAPENTIN 300 MG CAPSULE: 300 | 90 days supply | Qty: 90 | Fill #0

## 2018-09-08 NOTE — Progress Notes (Signed)
[facility] Team contacted patient to assess for food insecurity and other psychosocial needs during current COVID19 pandemic.  Unable to reach, left VM w contact information.  Beverely Pace, [place]

## 2018-09-15 ENCOUNTER — Inpatient Hospital Stay: Admit: 2018-09-15 | Payer: 59 | Admitting: Plastic Surgery

## 2018-09-15 SURGERY — RECONSTRUCTION, BREAST, USING LATISSIMUS DORSI MYOCUTANEOUS FLAP
Anesthesia: General | Site: Breast | Laterality: Left

## 2018-09-16 ENCOUNTER — Other Ambulatory Visit: Payer: Self-pay | Admitting: *Deleted

## 2018-09-16 ENCOUNTER — Telehealth: Payer: Self-pay | Admitting: *Deleted

## 2018-09-16 NOTE — Telephone Encounter (Addendum)
LVM to remind patient about her lab appointment tomorrow morning and study visit on Monday 09/20/18.  Asked patient to please return call to research nurse to confirm and for further information.  Foye Spurling, BSN, RN Clinical Research Nurse 09/16/2018 9:29 AM  Patient returned call to confirm lab tomorrow morning at 9 am and visit w/ Dr. Jana Hakim, Research RN and Injection on Monday at 12:30 am for Cycle 5. Instructed patient to fast x 8 hours prior to lab tomorrow at 9 am.Instructed patient to not take Letrozoleuntil after her blood draw tomorrow. Also instructed patient to nottake Letrozole or any Ribociclib at home on Monday. Bring both meds and medication bottles to clinic along with completed medication diary on Monday. Do not start next cycle of Ribociclib or Letrozole until instructed on Monday. Informed patient that another research RN, Tyrell Antonio will be meeting with her on Monday and giving her medications since the research department is taking turns working from home.  Patient verbalized understanding. Asked patient to please call if any questions or concerns before next visit. She verbalized understanding. Foye Spurling, BSN, RN Clinical Research Nurse 09/16/2018 11:24 AM

## 2018-09-16 NOTE — Patient Outreach (Addendum)
[facility]) Care Management  09/16/2018  Claribel OFILIA RAYON March 08, 1971 [id number]   Monthly telephonic UMR case management review. Update provided that elective breast reconstruction surgery that was scheduled for 09/15/18 has been canceled due to the COVID- 19 pandemic. No reschedule date for the surgery has been determined.   Barrington Ellison RN,CCM,CDE [place] Management Coordinator Office Phone [phone] Office Fax [phone]

## 2018-09-17 ENCOUNTER — Other Ambulatory Visit: Payer: Self-pay

## 2018-09-17 ENCOUNTER — Telehealth: Payer: Self-pay | Admitting: *Deleted

## 2018-09-17 ENCOUNTER — Inpatient Hospital Stay: Payer: 59 | Attending: Adult Health

## 2018-09-17 DIAGNOSIS — Z9012 Acquired absence of left breast and nipple: Secondary | ICD-10-CM | POA: Insufficient documentation

## 2018-09-17 DIAGNOSIS — Z006 Encounter for examination for normal comparison and control in clinical research program: Secondary | ICD-10-CM | POA: Insufficient documentation

## 2018-09-17 DIAGNOSIS — Z170 Estrogen receptor positive status [ER+]: Secondary | ICD-10-CM | POA: Insufficient documentation

## 2018-09-17 DIAGNOSIS — C50912 Malignant neoplasm of unspecified site of left female breast: Secondary | ICD-10-CM

## 2018-09-17 DIAGNOSIS — Z5111 Encounter for antineoplastic chemotherapy: Secondary | ICD-10-CM | POA: Insufficient documentation

## 2018-09-17 DIAGNOSIS — C773 Secondary and unspecified malignant neoplasm of axilla and upper limb lymph nodes: Principal | ICD-10-CM

## 2018-09-17 DIAGNOSIS — Z923 Personal history of irradiation: Secondary | ICD-10-CM | POA: Insufficient documentation

## 2018-09-17 DIAGNOSIS — C50812 Malignant neoplasm of overlapping sites of left female breast: Secondary | ICD-10-CM | POA: Insufficient documentation

## 2018-09-17 DIAGNOSIS — Z79811 Long term (current) use of aromatase inhibitors: Secondary | ICD-10-CM | POA: Insufficient documentation

## 2018-09-17 DIAGNOSIS — Z9221 Personal history of antineoplastic chemotherapy: Secondary | ICD-10-CM | POA: Insufficient documentation

## 2018-09-17 LAB — CMP (CANCER CENTER ONLY)
ALT: 18 U/L (ref 0–44)
AST: 16 U/L (ref 15–41)
Albumin: 3.4 g/dL — ABNORMAL LOW (ref 3.5–5.0)
Alkaline Phosphatase: 127 U/L — ABNORMAL HIGH (ref 38–126)
Anion gap: 9 (ref 5–15)
BUN: 12 mg/dL (ref 6–20)
CO2: 25 mmol/L (ref 22–32)
Calcium: 8.7 mg/dL — ABNORMAL LOW (ref 8.9–10.3)
Chloride: 105 mmol/L (ref 98–111)
Creatinine: 0.91 mg/dL (ref 0.44–1.00)
GFR, Est AFR Am: >60 mL/min (ref >60)
GFR, Estimated: >60 mL/min (ref >60)
Glucose, Bld: 106 mg/dL — ABNORMAL HIGH (ref 70–99)
Potassium: 3.9 mmol/L (ref 3.5–5.1)
Sodium: 139 mmol/L (ref 135–145)
Total Bilirubin: <0.2 mg/dL — ABNORMAL LOW (ref 0.3–1.2)
Total Protein: 7.4 g/dL (ref 6.5–8.1)

## 2018-09-17 LAB — CBC WITH DIFFERENTIAL (CANCER CENTER ONLY)
Abs Immature Granulocytes: 0.01 10*3/uL (ref 0.00–0.07)
Basophils Absolute: 0 10*3/uL (ref 0.0–0.1)
Basophils Relative: 1 %
Eosinophils Absolute: 0 10*3/uL (ref 0.0–0.5)
Eosinophils Relative: 1 %
HCT: 34.7 % — ABNORMAL LOW (ref 36.0–46.0)
Hemoglobin: 11.1 g/dL — ABNORMAL LOW (ref 12.0–15.0)
Immature Granulocytes: 0 %
Lymphocytes Relative: 24 %
Lymphs Abs: 0.9 10*3/uL (ref 0.7–4.0)
MCH: 28.3 pg (ref 26.0–34.0)
MCHC: 32 g/dL (ref 30.0–36.0)
MCV: 88.5 fL (ref 80.0–100.0)
Monocytes Absolute: 0.4 10*3/uL (ref 0.1–1.0)
Monocytes Relative: 11 %
Neutro Abs: 2.3 10*3/uL (ref 1.7–7.7)
Neutrophils Relative %: 63 %
Platelet Count: 236 10*3/uL (ref 150–400)
RBC: 3.92 MIL/uL (ref 3.87–5.11)
RDW: 15.7 % — ABNORMAL HIGH (ref 11.5–15.5)
WBC Count: 3.7 10*3/uL — ABNORMAL LOW (ref 4.0–10.5)
nRBC: 0 % (ref 0.0–0.2)

## 2018-09-17 LAB — AMYLASE: Amylase: 79 U/L (ref 28–100)

## 2018-09-17 LAB — URIC ACID: Uric Acid, Serum: 3.5 mg/dL (ref 2.5–7.1)

## 2018-09-17 LAB — LACTATE DEHYDROGENASE: LDH: 166 U/L (ref 98–192)

## 2018-09-17 LAB — LIPASE, BLOOD: Lipase: 26 U/L (ref 11–51)

## 2018-09-17 LAB — BILIRUBIN, DIRECT: Bilirubin, Direct: <0.1 mg/dL (ref 0.0–0.2)

## 2018-09-17 LAB — MAGNESIUM: Magnesium: 1.9 mg/dL (ref 1.7–2.4)

## 2018-09-17 LAB — GAMMA GT: GGT: 20 U/L (ref 7–50)

## 2018-09-17 LAB — PHOSPHORUS: Phosphorus: 4 mg/dL (ref 2.5–4.6)

## 2018-09-17 NOTE — Progress Notes (Signed)
Mackenzie [name]  Telephone:([phone]) [phone] Fax:(336) [phone]    ID: Mackenzie [name] DOB: 09/01/1970  MR#: [id number]  GYJ#:[id number]  Patient Care Team: Willey Blade, MD as PCP - General (Internal Medicine) Leoda Smithhart, [name], MD as Consulting Physician (Oncology) Erroll Luna, MD as Consulting Physician (General Surgery) Kyung Rudd, MD as Consulting Physician (Radiation Oncology) Dillingham, Loel Lofty, DO as Attending Physician (Plastic Surgery) Cathlean Cower, RN as Registered Nurse Barrington Ellison, RN as [place] Management OTHER MD:   CHIEF COMPLAINT: Estrogen receptor positive breast cancer  CURRENT TREATMENT: letrozole, goserelin; on [name] trial (ribociclib)   HISTORY OF CURRENT ILLNESS: From the original intake note:  The patient herself noted some changes in her left breast late July 2018, she says, and eventually brought this to medical attention so that on 01/09/2017 she underwent bilateral diagnostic mammography with tomography and left breast ultrasonography at the breast Center. This found the breast density to be category C. In the upper inner quadrant of the left breast there was an area of asymmetry and there were malignant type calcifications involving all 4 quadrants. On exam there is firmness and palpable thickening in the anterior left breast with skin dimpling. Ultrasonography found at the 9:30 o'clock radiant 3 cm from the nipple a 2.7 cm mass and in the left axilla for abnormal lymph nodes largest of which measured 2.7 cm.  Biopsy of the left breast 9:30 o'clock mass 80 01/26/2017 showed ([id number]) invasive ductal carcinoma, with extracellular mucin, grade 1 or 2. In the lower outer left breast is separated biopsy the same day showed ductal carcinoma in situ. One of the 4 lymph nodes involved was positive for metastatic carcinoma. Prognostic panel on the invasive disease showed it to be estrogen receptor 100%  positive, progesterone receptor 20% positive, both with strong staining intensity, with an MIB-1 of 20%, and no HER-2 amplification with a signals ratio 1.38 and the number per cell 2.90.  On 01/22/2017 the patient underwent bilateral breast MRI. This showed no involvement of the right breast, but in the left breast there was a masslike and non-masslike enhancement involving all quadrants, with skin swelling but no abnormal enhancement of the skin or nipple areolar complex or pectoralis muscle. The mass could not be clearly measured but spanned approximately 12 cm. There was bulky left axillary lymphadenopathy, with the largest lymph node measuring up to 3.4 cm.  The patient's subsequent history is as detailed below.   INTERVAL HISTORY: [name] returns today for follow-up and treatment of her estrogen receptor positive breast cancer.  She was receiving goserelin and letrozole and then for the trial she receives ribociclib  She is tolerating the letrozole with minimal hot flashes and no significant problems with vaginal dryness, and with no arthralgias or myalgias.  She also receives Goserelin every 4 weeks, with her most recent dose received on 08/23/2018.  She will receive a dose today.  She tolerates this well  Finally, she also continues on ribociclib.  She will have an echocardiogram today and of course she had labs 3 days ago, but she is having no significant side effects related to this medication  Her left breast latissimus flab and placement of tissue expander, originally scheduled for 09/15/2018, was canceled and will be reschedule for a future date, likely May or June  REVIEW OF SYSTEMS: Annalis is working a little more than 2 days a week at [address] [employer].  Much of the work she is doing currently  is not directly patient related.  Of course at home she has daughters aged 17 and 61.  Everybody is following appropriate coronavirus precautions.  No seen denies unusual headaches, visual  changes, nausea, vomiting, chest pain or pressure, palpitations, fever, cough, shortness of breath, or diarrhea.  A detailed review of systems was otherwise stable  PAST MEDICAL HISTORY: Past Medical History:  Diagnosis Date  . Abnormal glucose 2018  . 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 Allergic rhinitis 2012  . Anemia 01/27/2017   prior to starting chemotherapy  . Breast cancer ([place]) 01/14/2017   Left breast  . 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 Diabetes mellitus 2012   GDM  . GERD (gastroesophageal reflux disease) 2018  . Hot flashes 03/2017  . Hyperlipidemia 03/20/2016  . Hypertension 2012  . Morbid obesity with body mass index (BMI) of 40.0 to 49.9 ([place]) 09/17/2017  . NCGS (non-celiac gluten sensitivity)   . Vitamin D deficiency 05/2015    PAST SURGICAL HISTORY: Past Surgical History:  Procedure Laterality Date  . BREAST RECONSTRUCTION WITH PLACEMENT OF TISSUE EXPANDER AND FLEX HD (ACELLULAR HYDRATED DERMIS) Left 08/27/2017   Procedure: LEFT BREAST RECONSTRUCTION WITH PLACEMENT OF TISSUE EXPANDER AND FLEX HD;  Surgeon: Wallace Going, DO;  Location: [facility];  Service: Plastics;  Laterality: Left;  . CESAREAN SECTION     x2  . MASTECTOMY MODIFIED RADICAL Left 08/27/2017  . MASTECTOMY MODIFIED RADICAL Left 08/27/2017   Procedure: LEFT MODIFIED RADICAL MASTECTOMY;  Surgeon: Erroll Luna, MD;  Location: [facility];  Service: General;  Laterality: Left;  . PORT-A-CATH REMOVAL Right 08/27/2017   Procedure: REMOVAL PORT-A-CATH RIGHT CHEST;  Surgeon: Erroll Luna, MD;  Location: [facility];  Service: General;  Laterality: Right;  . PORTACATH PLACEMENT Right 01/28/2017   Procedure: INSERTION PORT-A-CATH WITH ULTRASOUND;  Surgeon: Erroll Luna, MD;  Location: [facility];  Service: General;  Laterality: Right;  . TUBAL LIGATION Bilateral 01/21/2011    FAMILY HISTORY Family History  Problem Relation Age of Onset  .  Breast cancer Paternal Grandmother 26       d.60s from breast cancer. Did not have treatment.  . Other Mother        S.06 from complications of surgery to remove brain tumor   The patient's father still alive at age 28. The patient's mother died with a brain tumor which the patient says was "benign". She was 56. The patient has one brother, no sisters. The only breast cancer in the family is a paternal grandmother who died from breast cancer at an unknown age. There is no history of ovarian or prostate cancer in the family.   GYNECOLOGIC HISTORY:  No LMP recorded. (Menstrual status: Chemotherapy). Menarche age 18 and first live birth age 56 she is [place] P4. The person is still having regular periods as of August 2018   SOCIAL HISTORY: (Updated 05/31/2018) Jamilex works at [employer] on the fifth floor, [place]. She is separated from her husband (as of 05/2018), [name], who is a Building control surveyor. Daughter is married and lives in [place] and works as a Education administrator. Amarionna's daughter, [name], lives in [place] and works in the police department. Daughters, [place] and Prince are 6 and 6, living at home. The patient has no grandchildren. She attends a [employer].     ADVANCED DIRECTIVES: Not in place   HEALTH MAINTENANCE: Social History   Tobacco Use  . Smoking status: Never Smoker  .  Smokeless tobacco: Never Used  Substance Use Topics  . Alcohol use: Yes    Comment: social  . Drug use: No    Colonoscopy: Never  PAP: December 2017  Bone density: Never   No Known Allergies  Current Outpatient Medications  Medication Sig Dispense Refill  . acetaminophen (TYLENOL) 500 MG tablet Take 1,000 mg by mouth every 6 (six) hours as needed (for pain/headaches.).     Marland Kitchen B Complex Vitamins (VITAMIN B COMPLEX PO) Take 1 capsule by mouth 2 (two) times daily.     . benzonatate (TESSALON) 100 MG capsule Take 100 mg by mouth every 8 (eight) hours as needed for cough.   0  .  budesonide-formoterol (SYMBICORT) 80-4.5 MCG/ACT inhaler Inhale 2 puffs into the lungs 2 (two) times daily.     . Calcium Carb-Cholecalciferol (CALCIUM 600+D3 PO) Take 1 tablet by mouth 2 (two) times daily.     . cetirizine (ZYRTEC) 10 MG tablet Take 10 mg by mouth daily.     . Cholecalciferol (VITAMIN D-3) 5000 units TABS Take 5,000 Units by mouth 2 (two) times daily.     Marland Kitchen gabapentin (NEURONTIN) 300 MG capsule Take 1 capsule (300 mg total) by mouth at bedtime. (Patient taking differently: Take 300 mg by mouth at bedtime. ) 90 capsule 4  . ibuprofen (ADVIL,MOTRIN) 200 MG tablet Take 800 mg by mouth every 8 (eight) hours as needed for mild pain (for pain.).     . Investigational ribociclib (KISQALI) 200 MG tablet [name] Take 2 tablets (400 mg total) by mouth daily. then 7 days off. 75 tablet 0  . letrozole (FEMARA) 2.5 MG tablet TAKE 1 TABLET BY MOUTH DAILY. 30 tablet 5  . loratadine (CLARITIN) 10 MG tablet Take 1 tablet (10 mg total) by mouth daily. (Patient taking differently: Take 10 mg by mouth daily as needed for allergies. ) 90 tablet 2  . Multiple Vitamin (MULTIVITAMIN WITH MINERALS) TABS tablet Take 1 tablet by mouth daily.     . Multiple Vitamins-Minerals (EMERGEN-C IMMUNE PO) Take by mouth as needed.    . Multiple Vitamins-Minerals (ZINC PO) Take 1 tablet by mouth daily.     . Omega-3 Fatty Acids (FISH OIL) 1000 MG CAPS Take 1,000 mg by mouth 2 (two) times daily.     . phenylephrine (SUDAFED PE) 10 MG TABS tablet Take 10 mg by mouth every 4 (four) hours as needed.    . Probiotic Product (PROBIOTIC DAILY PO) Take by mouth 2 (two) times daily.     Marland Kitchen senna (SENOKOT) 8.6 MG TABS tablet Take 1 tablet (8.6 mg total) by mouth 2 (two) times daily. (Patient taking differently: Take 1 tablet by mouth 2 (two) times daily. Patient takes prn) 120 each 0   No current facility-administered medications for this visit.    Facility-Administered Medications Ordered in Other Visits  Medication Dose  Route Frequency Provider Last Rate Last Dose  . sodium chloride flush (NS) 0.9 % injection 10 mL  10 mL Intravenous PRN Iban Utz, [name], MD   10 mL at 03/10/17 1239    OBJECTIVE: Middle-aged African-American woman in no acute distress Vitals:   09/20/18 1250  BP: 119/80  Pulse: 83  Resp: 18  Temp: 98.8 F (37.1 C)  SpO2: 99%     Body mass index is 41.18 kg/m.   Wt Readings from Last 3 Encounters:  09/20/18 262 lb 14.4 oz (119.3 kg)  08/31/18 257 lb (116.6 kg)  08/23/18 253 lb 6.4 oz (114.9  kg)   ECOG FS: 1 - Symptomatic but completely ambulatory Sclerae unicteric, EOMs intact Oropharynx clear and moist No cervical or supraclavicular adenopathy Lungs no rales or rhonchi Heart regular rate and rhythm Abd soft, nontender, positive bowel sounds MSK no focal spinal tenderness, no upper extremity lymphedema Neuro: nonfocal, well oriented, appropriate affect Breasts: Deferred   LAB RESULTS:  No visits with results within 3 Day(s) from this visit.  Latest known visit with results is:  Appointment on 09/17/2018  Component Date Value Ref Range Status  . Uric Acid, Serum 09/17/2018 3.5  2.5 - 7.1 mg/dL Final   Performed at [facility], [place] [address], [place], [place] [address]  . Phosphorus 09/17/2018 4.0  2.5 - 4.6 mg/dL Final   Performed at [place] [address], [place], [place] [address]  . Magnesium 09/17/2018 1.9  1.7 - 2.4 mg/dL Final   Performed at [facility], [place] [address], [place], [place] [address]  . LDH 09/17/2018 166  98 - 192 U/L Final   Performed at [facility], [place] [address] [place], [place] [address]  . Lipase 09/17/2018 26  11 - 51 U/L Final   Performed at [facility], [place] [address] [place], [place] [address]  . GGT 09/17/2018 20  7 - 50 U/L Final   Performed at [facility], [place] [address], [place], [place] [address]  .  Sodium 09/17/2018 139  135 - 145 mmol/L Final  . Potassium 09/17/2018 3.9  3.5 - 5.1 mmol/L Final  . Chloride 09/17/2018 105  98 - 111 mmol/L Final  . CO2 09/17/2018 25  22 - 32 mmol/L Final  . Glucose, Bld 09/17/2018 106* 70 - 99 mg/dL Final  . BUN 09/17/2018 12  6 - 20 mg/dL Final  . Creatinine 09/17/2018 0.91  0.44 - 1.00 mg/dL Final  . Calcium 09/17/2018 8.7* 8.9 - 10.3 mg/dL Final  . Total Protein 09/17/2018 7.4  6.5 - 8.1 g/dL Final  . Albumin 09/17/2018 3.4* 3.5 - 5.0 g/dL Final  . AST 09/17/2018 16  15 - 41 U/L Final  . ALT 09/17/2018 18  0 - 44 U/L Final  . Alkaline Phosphatase 09/17/2018 127* 38 - 126 U/L Final  . Total Bilirubin 09/17/2018 <0.2* 0.3 - 1.2 mg/dL Final  . GFR, Est Non Af Am 09/17/2018 >60  >60 mL/min Final  . GFR, Est AFR Am 09/17/2018 >60  >60 mL/min Final  . Anion gap 09/17/2018 9  5 - 15 Final   Performed at [facility], [place] [address], [place], [place] [address]  . WBC Count 09/17/2018 3.7* 4.0 - 10.5 K/uL Final  . RBC 09/17/2018 3.92  3.87 - 5.11 MIL/uL Final  . Hemoglobin 09/17/2018 11.1* 12.0 - 15.0 g/dL Final  . HCT 09/17/2018 34.7* 36.0 - 46.0 % Final  . MCV 09/17/2018 88.5  80.0 - 100.0 fL Final  . MCH 09/17/2018 28.3  26.0 - 34.0 pg Final  . MCHC 09/17/2018 32.0  30.0 - 36.0 g/dL Final  . RDW 09/17/2018 15.7* 11.5 - 15.5 % Final  . Platelet Count 09/17/2018 236  150 - 400 K/uL Final  . nRBC 09/17/2018 0.0  0.0 - 0.2 % Final  . Neutrophils Relative % 09/17/2018 63  % Final  . Neutro Abs 09/17/2018 2.3  1.7 - 7.7 K/uL Final  . Lymphocytes Relative 09/17/2018 24  % Final  . Lymphs Abs  09/17/2018 0.9  0.7 - 4.0 K/uL Final  . Monocytes Relative 09/17/2018 11  % Final  . Monocytes Absolute 09/17/2018 0.4  0.1 - 1.0 K/uL Final  . Eosinophils Relative 09/17/2018 1  % Final  . Eosinophils Absolute 09/17/2018 0.0  0.0 - 0.5 K/uL Final  . Basophils Relative 09/17/2018 1  % Final  . Basophils Absolute 09/17/2018 0.0  0.0 - 0.1  K/uL Final  . Immature Granulocytes 09/17/2018 0  % Final  . Abs Immature Granulocytes 09/17/2018 0.01  0.00 - 0.07 K/uL Final   Performed at [facility], [place] [address], [place], [place] [address]  . Bilirubin, Direct 09/17/2018 <0.1  0.0 - 0.2 mg/dL Final   Performed at [place] [address] [place], [place] [address]  . Amylase 09/17/2018 79  28 - 100 U/L Final   Performed at [facility], [place] [address], [place], [place] [address]     STUDIES: No results found.    ELIGIBLE FOR AVAILABLE RESEARCH PROTOCOL: [name], ASA study   ASSESSMENT: 48 y.o. [place] woman status post left breast upper inner quadrant biopsy 01/14/2017 for a clinical T3 N2, stage IIA invasive ductal carcinoma, grade 1 or 2, estrogen and progesterone receptor positive, HER-2 nonamplified, with an MIB-1 of 20%.  (1) staging studies: Brain MRI, bone scan, and CT scan of the chest 02/05/2017 showed no brain lesions, no lung or liver lesions, a 4.9 cm mass in the left breast with left axillary and subpectoral adenopathy, and nonspecific bone scan tracer at L2, left scapula, and anterior ribs, with lumbar spine MRI suggested for further evaluation.  (a) lumbar spine MRI 02/17/2017 showed no normal bone lesions.  There was mild lumbar spondylosis  (2) neoadjuvant chemotherapy consisting of cyclophosphamide and doxorubicin in dose dense fashion 4 started 02/10/2017, completed 03/24/2017, followed by weekly carboplatin and gemcitabine given days 1 and 8 of each 21-day cycle starting 04/14/2017, completing the planned 4 cycles 06/26/2017  (3) is post left modified radical mastectomy on 08/27/2017 showing an mpT3 pN2 residual invasive ductal carcinoma, grade 2, with a residual cancer burden of 3.  Margins were clear  (a) TRAM flap reconstruction planned for 09/13/2018  (4) postmastectomy radiation completed 01/01/2018  (a) capecitabine radiosensitization  11/16/2017-01/01/2018  (5) goserelin started 09/21/2017  (a) letrozole started 01/11/2018  (b) enrolled in [place] clinical trial, randomized to Ribociclib on 05/31/2018  (c) Bone density on 03/16/2018 was normal with T score of -0.7 in the L1-L4 spine   (6) genetics testing 03/24/2017 through the Common Hereditary Cancer Panel offered by [facility] found no deleterious mutations in APC, ATM, AXIN2, BARD1, BMPR1A, BRCA1, BRCA2, BRIP1, CDH1, CDKN2A (p14ARF), CDKN2A (p16INK4a), CHEK2, CTNNA1, DICER1, EPCAM (Deletion/duplication testing only), GREM1 (promoter region deletion/duplication testing only), KIT, MEN1, MLH1, MSH2, MSH3, MSH6, MUTYH, NBN, NF1, NHTL1, PALB2, PDGFRA, PMS2, POLD1, POLE, PTEN, RAD50, RAD51C, RAD51D, SDHB, SDHC, SDHD, SMAD4, SMARCA4. STK11, TP53, TSC1, TSC2, and VHL.  The following genes were evaluated for sequence changes only: SDHA and HOXB13 c.251G>A variant only.     PLAN: Hamda is now a year out from definitive surgery for her breast cancer, with no evidence of disease recurrence.  This is very favorable.  She is tolerating her treatment generally well and does not give me any unusual side effects related to her therapy.  Her left breast reconstruction is being delayed because of the current pandemic but hopefully she can get it done in May or June.  Because she gets  an EKG every 28 days and also has to stop by to pick up her medicine every 28 days, we are not going to switch her Zoladex to every 3 months at this point, but we can do that once she comes off study.  She knows to call for any other issue that may develop before the next visit.  Laterrian Hevener, [name], MD  09/20/18 2:32 PM Medical Oncology and Hematology [facility] [address] [place], [place] [address] Tel. [phone]    Fax. [phone]  I, Jacqualyn Posey am acting as a Education administrator for Chauncey Cruel, MD.   I, Lurline Del MD, have reviewed the above documentation for accuracy and  completeness, and I agree with the above.  ADDENDUM: Patient had an EKG today which was normal.

## 2018-09-17 NOTE — Telephone Encounter (Signed)
Phone call prior to [place] Study: Called patient on phone to complete some study assessments prior to clinic visit on Monday.  This is done to limit amount of time patient has to spend in clinic due to safety concerns of [place] outbreak.  Labs: Completed today and all results are within study parameters to start on Cycle 5 of Ribociclib as scheduled for Monday 09/20/18.  Con Meds: Reviewed medication list with patient. Patient reports no new meds.  She has not taken Singulair in over 60 days and no plans to take in near future. She has also not taken Mucinex in over 60 days and no plans to resume it in near future. Med list updated accordingly.  AEs: Patient reports increase in leg aches over the past week.  She thinks this may be due to increase in walking. Patient asks if it is okay to take Tumeric on this study.  Informed patient Tumeric is allowed on this study. Patient will let research nurse know if she starts taking it. Thanked patient for her time today.  Reminded patient to not take any Ribociclib or Letrozole Monday morning as she will need to take them in the clinic. Reminded patient to bring her Medication diary and Medication bottle of Ribociclib and Letrozole with her to visit on Monday.  Patient verbalized understanding.  Foye Spurling, BSN, RN Clinical Research Nurse 09/17/2018 3:07 PM

## 2018-09-20 ENCOUNTER — Other Ambulatory Visit: Payer: Self-pay

## 2018-09-20 ENCOUNTER — Inpatient Hospital Stay: Payer: 59

## 2018-09-20 ENCOUNTER — Inpatient Hospital Stay (HOSPITAL_BASED_OUTPATIENT_CLINIC_OR_DEPARTMENT_OTHER): Payer: 59 | Admitting: Oncology

## 2018-09-20 ENCOUNTER — Encounter: Payer: Self-pay | Admitting: *Deleted

## 2018-09-20 VITALS — BP 119/80 | HR 83 | Temp 98.8°F | Resp 18 | Ht 67.0 in | Wt 262.9 lb

## 2018-09-20 DIAGNOSIS — C773 Secondary and unspecified malignant neoplasm of axilla and upper limb lymph nodes: Secondary | ICD-10-CM

## 2018-09-20 DIAGNOSIS — C50812 Malignant neoplasm of overlapping sites of left female breast: Secondary | ICD-10-CM

## 2018-09-20 DIAGNOSIS — C50212 Malignant neoplasm of upper-inner quadrant of left female breast: Secondary | ICD-10-CM

## 2018-09-20 DIAGNOSIS — Z9221 Personal history of antineoplastic chemotherapy: Secondary | ICD-10-CM

## 2018-09-20 DIAGNOSIS — C50912 Malignant neoplasm of unspecified site of left female breast: Secondary | ICD-10-CM

## 2018-09-20 DIAGNOSIS — Z006 Encounter for examination for normal comparison and control in clinical research program: Secondary | ICD-10-CM

## 2018-09-20 DIAGNOSIS — Z170 Estrogen receptor positive status [ER+]: Principal | ICD-10-CM

## 2018-09-20 DIAGNOSIS — Z9012 Acquired absence of left breast and nipple: Secondary | ICD-10-CM

## 2018-09-20 DIAGNOSIS — Z923 Personal history of irradiation: Secondary | ICD-10-CM

## 2018-09-20 DIAGNOSIS — Z79811 Long term (current) use of aromatase inhibitors: Secondary | ICD-10-CM

## 2018-09-20 MED ORDER — GOSERELIN ACETATE 3.6 MG ~~LOC~~ IMPL
DRUG_IMPLANT | SUBCUTANEOUS | Status: AC
  Filled 2018-09-20: qty 3.6

## 2018-09-20 MED ORDER — INV-RIBOCICLIB 200 MG TAB NATALEE TRIO003 STUDY: 400.0000 mg | ORAL_TABLET | Freq: Every day | ORAL | 0 refills | Status: DC

## 2018-09-20 MED ORDER — GOSERELIN ACETATE 3.6 MG ~~LOC~~ IMPL
3.6000 mg | DRUG_IMPLANT | Freq: Once | SUBCUTANEOUS | Status: AC
  Administered 2018-09-20: 3.6 mg via SUBCUTANEOUS

## 2018-09-20 NOTE — Patient Instructions (Signed)
Goserelin injection What is this medicine? GOSERELIN (GOE se rel in) is similar to a hormone found in the body. It lowers the amount of sex hormones that the body makes. Men will have lower testosterone levels and women will have lower estrogen levels while taking this medicine. In men, this medicine is used to treat prostate cancer; the injection is either given once per month or once every 12 weeks. A once per month injection (only) is used to treat women with endometriosis, dysfunctional uterine bleeding, or advanced breast cancer. This medicine may be used for other purposes; ask your health care provider or pharmacist if you have questions. COMMON BRAND NAME(S): Zoladex What should I tell my health care provider before I take this medicine? They need to know if you have any of these conditions (some only apply to women): -diabetes -heart disease or previous heart attack -high blood pressure -high cholesterol -kidney disease -osteoporosis or low bone density -problems passing urine -spinal cord injury -stroke -tobacco smoker -an unusual or allergic reaction to goserelin, hormone therapy, other medicines, foods, dyes, or preservatives -pregnant or trying to get pregnant -breast-feeding How should I use this medicine? This medicine is for injection under the skin. It is given by a health care professional in a hospital or clinic setting. Men receive this injection once every 4 weeks or once every 12 weeks. Women will only receive the once every 4 weeks injection. Talk to your pediatrician regarding the use of this medicine in children. Special care may be needed. Overdosage: If you think you have taken too much of this medicine contact a poison control center or emergency room at once. NOTE: This medicine is only for you. Do not share this medicine with others. What if I miss a dose? It is important not to miss your dose. Call your doctor or health care professional if you are unable to  keep an appointment. What may interact with this medicine? -female hormones like estrogen -herbal or dietary supplements like black cohosh, chasteberry, or DHEA -female hormones like testosterone -prasterone This list may not describe all possible interactions. Give your health care provider a list of all the medicines, herbs, non-prescription drugs, or dietary supplements you use. Also tell them if you smoke, drink alcohol, or use illegal drugs. Some items may interact with your medicine. What should I watch for while using this medicine? Visit your doctor or health care professional for regular checks on your progress. Your symptoms may appear to get worse during the first weeks of this therapy. Tell your doctor or healthcare professional if your symptoms do not start to get better or if they get worse after this time. Your bones may get weaker if you take this medicine for a long time. If you smoke or frequently drink alcohol you may increase your risk of bone loss. A family history of osteoporosis, chronic use of drugs for seizures (convulsions), or corticosteroids can also increase your risk of bone loss. Talk to your doctor about how to keep your bones strong. This medicine should stop regular monthly menstration in women. Tell your doctor if you continue to menstrate. Women should not become pregnant while taking this medicine or for 12 weeks after stopping this medicine. Women should inform their doctor if they wish to become pregnant or think they might be pregnant. There is a potential for serious side effects to an unborn child. Talk to your health care professional or pharmacist for more information. Do not breast-feed an infant while taking   this medicine. Men should inform their doctors if they wish to father a child. This medicine may lower sperm counts. Talk to your health care professional or pharmacist for more information. What side effects may I notice from receiving this  medicine? Side effects that you should report to your doctor or health care professional as soon as possible: -allergic reactions like skin rash, itching or hives, swelling of the face, lips, or tongue -bone pain -breathing problems -changes in vision -chest pain -feeling faint or lightheaded, falls -fever, chills -pain, swelling, warmth in the leg -pain, tingling, numbness in the hands or feet -signs and symptoms of low blood pressure like dizziness; feeling faint or lightheaded, falls; unusually weak or tired -stomach pain -swelling of the ankles, feet, hands -trouble passing urine or change in the amount of urine -unusually high or low blood pressure -unusually weak or tired Side effects that usually do not require medical attention (report to your doctor or health care professional if they continue or are bothersome): -change in sex drive or performance -changes in breast size in both males and females -changes in emotions or moods -headache -hot flashes -irritation at site where injected -loss of appetite -skin problems like acne, dry skin -vaginal dryness This list may not describe all possible side effects. Call your doctor for medical advice about side effects. You may report side effects to FDA at 1-800-FDA-1088. Where should I keep my medicine? This drug is given in a hospital or clinic and will not be stored at home. NOTE: This sheet is a summary. It may not cover all possible information. If you have questions about this medicine, talk to your doctor, pharmacist, or health care provider.  2019 Elsevier/Gold Standard (2013-08-02 11:10:35)  

## 2018-09-20 NOTE — Research (Signed)
Assessment for C5D1OF [name]: Patient into clinic by herself today to complete study activities.  Labs:  Fasting labs completed on 09/17/18. All results fall within study parameters to continue treatment without dose adjustment. Dr. Jana Hakim  reviewed labs and agrees they are adequate to start Cycle 5 of Ribociclib. VS: Completed per protocol. BP taken after patient resting x 5 minutes and weight obtained without shoes or heavy clothing.  Con Meds: Patient denies any medication changes since we reviewed them on Friday 09/17/18 except she did take start taking Tumeric this weekend and plans to continue daily. She doesn't know the dose and says she will call research nurse back with the dose. Reminded patient to notify research nurse of any other new medications prior to taking them unless it is an emergency. She verbalized understanding.  History & Physical: Completed by Dr. Jana Hakim.  See provider encounter note from today's date.  AEs: Reviewed with patient by this research RN on 09/17/18.  See phone note from 09/17/18.  Patient denies any changes to AE assessment since that phone conversation. See AE table below. ECG:  Not required for today's visit per study.  Dr. Jana Hakim ordered ECG which was done in clinic today to follow up on previous "abnormal" readings of study ECGs.  This ECG was normal.  Study Drug: Patient returned completed medication diary for Cycle 4 which shows no missed doses of Ribociclib and Letrozole.  Patient returned bottle of Ribociclib kit [id number] with 33 pills left in bottle, which is the expected number of pills remaining for patient not missing any doses this cycle. IRT assigned kit #[id number] for cycle 5 Ribociclib.  Dispensed by Demetrius Charity, Pharmacist. Double checked patients name, DOB, Study ID #, Kit#, expiration date and dose.  Ribociclib then given to patient along with medication diary for cycle 5 Ribociclib and Letrozole. Patient took dose of Ribociclib and Letrozole  in clinic today at 1:50 PM and documented in her medication diary. Goserelin injection given by Injection nurse today after study visit.   AE Table: Event Grade Onset Date End Date Status Comments Attribution to Ribociclib Attribution to Letrozole Attribution to Goserelin  Allergic Rhinitis 2 2012  ongoing At Baseline n/a n/a n/a  Anemia 1 2018  ongoing At Baseline n/a n/a n/a  Arthralgias, legs 1 12/2017 09/10/18 increased  n/a n/a n/a  Constipation 1 08/27/17  ongoing At Baseline n/a n/a n/a  Cough, intermittent 2 05/09/18  ongoing At Baseline n/a n/a n/a  GERD 1 2018  intermittent At Baseline n/a n/a n/a  Gluten Sensitivity 1 12/2017  ongoing At Baseline n/a n/a n/a  Headaches 1 02/2017  ongoing At Baseline n/a n/a n/a  Hot Flashes 2 03/2017  ongoing At Baseline n/a n/a n/a  Hyperglycemia 1 2018  ongoing At Baseline n/a n/a n/a  Hyperlipidemia 1 03/2016  ongoing At Baseline n/a n/a n/a  Vitamin D Deficiency 2 05/2015  ongoing At Baseline n/a n/a n/a  Hypertension 2 08/23/18 09/20/18 ongoing Decreased No No No  Fatigue 1 06/28/18  ongoing   Yes No No  Blurred Vision 1 07/14/18  ongoing  No No No  Decreased WBC 1 08/20/18  ongoing  Yes No No  Arthralgias bilat legs 2 09/10/18  ongoing  No Yes No  Hypertension 1 09/20/18  ongoing  No No N0   Thanked patient for her participation today.  Reviewed schedule for next cycle with fasting lab on Friday 5/8 and study visit on Monday 5/11 for C6D1.  Instructed  patient to call for any questions or concerns prior to next visit. She verbalized understanding.   Foye Spurling, BSN, RN Clinical Research Nurse 09/20/2018 2:34 PM

## 2018-09-21 ENCOUNTER — Telehealth: Payer: Self-pay | Admitting: Oncology

## 2018-09-21 ENCOUNTER — Other Ambulatory Visit: Payer: Self-pay | Admitting: Oncology

## 2018-09-21 NOTE — Telephone Encounter (Signed)
Tried to reach regarding schedule °

## 2018-09-21 NOTE — Progress Notes (Unsigned)
[place]  [phone] [phone]    ID: Mackenzie [name] DOB: [birth year]  MR#: [id number]  PJA#:[id number]  Patient Care Team: Willey Blade, MD as PCP - General (Internal Medicine) Debbe Crumble, [name], MD as Consulting Physician (Oncology) Erroll Luna, MD as Consulting Physician (General Surgery) Kyung Rudd, MD as Consulting Physician (Radiation Oncology) Dillingham, Loel Lofty, DO as Attending Physician (Plastic Surgery) Cathlean Cower, RN as Registered Nurse Barrington Ellison, RN as [place] Management OTHER MD:   CHIEF COMPLAINT: Estrogen receptor positive breast cancer  CURRENT TREATMENT: letrozole, goserelin; on [name] trial (ribociclib)   HISTORY OF CURRENT ILLNESS: From the original intake note:  The patient herself noted some changes in her left breast late July 2018, she says, and eventually brought this to medical attention so that on 01/09/2017 she underwent bilateral diagnostic mammography with tomography and left breast ultrasonography at the breast Center. This found the breast density to be category C. In the upper inner quadrant of the left breast there was an area of asymmetry and there were malignant type calcifications involving all 4 quadrants. On exam there is firmness and palpable thickening in the anterior left breast with skin dimpling. Ultrasonography found at the 9:30 o'clock radiant 3 cm from the nipple a 2.7 cm mass and in the left axilla for abnormal lymph nodes largest of which measured 2.7 cm.  Biopsy of the left breast 9:30 o'clock mass 80 01/26/2017 showed ([id number]) invasive ductal carcinoma, with extracellular mucin, grade 1 or 2. In the lower outer left breast is separated biopsy the same day showed ductal carcinoma in situ. One of the 4 lymph nodes involved was positive for metastatic carcinoma. Prognostic panel on the invasive disease showed it to be estrogen receptor 100%  positive, progesterone receptor 20% positive, both with strong staining intensity, with an MIB-1 of 20%, and no HER-2 amplification with a signals ratio 1.38 and the number per cell 2.90.  On 01/22/2017 the patient underwent bilateral breast MRI. This showed no involvement of the right breast, but in the left breast there was a masslike and non-masslike enhancement involving all quadrants, with skin swelling but no abnormal enhancement of the skin or nipple areolar complex or pectoralis muscle. The mass could not be clearly measured but spanned approximately 12 cm. There was bulky left axillary lymphadenopathy, with the largest lymph node measuring up to 3.4 cm.  The patient's subsequent history is as detailed below.   INTERVAL HISTORY: Mackenzie [name] returns today for follow-up and treatment of her estrogen receptor positive breast cancer.  She was receiving goserelin and letrozole and then for the trial she receives ribociclib  She is tolerating the letrozole with minimal hot flashes and no significant problems with vaginal dryness, and with no arthralgias or myalgias.  She also receives Goserelin every 4 weeks, with her most recent dose received on 08/23/2018.  She will receive a dose today.  She tolerates this well  Finally, she also continues on ribociclib.  She will have an echocardiogram today and of course she had labs 3 days ago, but she is having no significant side effects related to this medication  Her left breast latissimus flab and placement of tissue expander, originally scheduled for 09/15/2018, was canceled and will be reschedule for a future date, likely May or June  REVIEW OF SYSTEMS: Mackenzie [name] is working a little more than 2 days a week at [address] [employer].  Much of the work she is doing currently  is not directly patient related.  Of course at home she has daughters aged 17 and 61.  Everybody is following appropriate coronavirus precautions.  No seen denies unusual headaches, visual  changes, nausea, vomiting, chest pain or pressure, palpitations, fever, cough, shortness of breath, or diarrhea.  A detailed review of systems was otherwise stable  PAST MEDICAL HISTORY: Past Medical History:  Diagnosis Date  . Abnormal glucose 2018  . 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 Allergic rhinitis 2012  . Anemia 01/27/2017   prior to starting chemotherapy  . Breast cancer ([place]) 01/14/2017   Left breast  . 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 Diabetes mellitus 2012   GDM  . GERD (gastroesophageal reflux disease) 2018  . Hot flashes 03/2017  . Hyperlipidemia 03/20/2016  . Hypertension 2012  . Morbid obesity with body mass index (BMI) of 40.0 to 49.9 ([place]) 09/17/2017  . NCGS (non-celiac gluten sensitivity)   . Vitamin D deficiency 05/2015    PAST SURGICAL HISTORY: Past Surgical History:  Procedure Laterality Date  . BREAST RECONSTRUCTION WITH PLACEMENT OF TISSUE EXPANDER AND FLEX HD (ACELLULAR HYDRATED DERMIS) Left 08/27/2017   Procedure: LEFT BREAST RECONSTRUCTION WITH PLACEMENT OF TISSUE EXPANDER AND FLEX HD;  Surgeon: Wallace Going, DO;  Location: [facility];  Service: Plastics;  Laterality: Left;  . CESAREAN SECTION     x2  . MASTECTOMY MODIFIED RADICAL Left 08/27/2017  . MASTECTOMY MODIFIED RADICAL Left 08/27/2017   Procedure: LEFT MODIFIED RADICAL MASTECTOMY;  Surgeon: Erroll Luna, MD;  Location: [facility];  Service: General;  Laterality: Left;  . PORT-A-CATH REMOVAL Right 08/27/2017   Procedure: REMOVAL PORT-A-CATH RIGHT CHEST;  Surgeon: Erroll Luna, MD;  Location: [facility];  Service: General;  Laterality: Right;  . PORTACATH PLACEMENT Right 01/28/2017   Procedure: INSERTION PORT-A-CATH WITH ULTRASOUND;  Surgeon: Erroll Luna, MD;  Location: [facility];  Service: General;  Laterality: Right;  . TUBAL LIGATION Bilateral 01/21/2011    FAMILY HISTORY Family History  Problem Relation Age of Onset  .  Breast cancer Paternal Grandmother 26       d.60s from breast cancer. Did not have treatment.  . Other Mother        S.06 from complications of surgery to remove brain tumor   The patient's father still alive at age 28. The patient's mother died with a brain tumor which the patient says was "benign". She was 56. The patient has one brother, no sisters. The only breast cancer in the family is a paternal grandmother who died from breast cancer at an unknown age. There is no history of ovarian or prostate cancer in the family.   GYNECOLOGIC HISTORY:  No LMP recorded. (Menstrual status: Chemotherapy). Menarche age 18 and first live birth age 56 she is [place] P4. The person is still having regular periods as of August 2018   SOCIAL HISTORY: (Updated 05/31/2018) Mackenzie [name] works at [employer] on the fifth floor, [place]. She is separated from her husband (as of 05/2018), [name], who is a Building control surveyor. Daughter is married and lives in [place] and works as a Education administrator. Emine's daughter, [name], lives in [place] and works in the police department. Daughters, [place] and Prince are 6 and 6, living at home. The patient has no grandchildren. She attends a [employer].     ADVANCED DIRECTIVES: Not in place   HEALTH MAINTENANCE: Social History   Tobacco Use  . Smoking status: Never Smoker  .  Smokeless tobacco: Never Used  Substance Use Topics  . Alcohol use: Yes    Comment: social  . Drug use: No    Colonoscopy: Never  PAP: December 2017  Bone density: Never   No Known Allergies  Current Outpatient Medications  Medication Sig Dispense Refill  . acetaminophen (TYLENOL) 500 MG tablet Take 1,000 mg by mouth every 6 (six) hours as needed (for pain/headaches.).     Marland Kitchen B Complex Vitamins (VITAMIN B COMPLEX PO) Take 1 capsule by mouth 2 (two) times daily.     . benzonatate (TESSALON) 100 MG capsule Take 100 mg by mouth every 8 (eight) hours as needed for cough.   0  .  budesonide-formoterol (SYMBICORT) 80-4.5 MCG/ACT inhaler Inhale 2 puffs into the lungs 2 (two) times daily.     . Calcium Carb-Cholecalciferol (CALCIUM 600+D3 PO) Take 1 tablet by mouth 2 (two) times daily.     . cetirizine (ZYRTEC) 10 MG tablet Take 10 mg by mouth daily.     . Cholecalciferol (VITAMIN D-3) 5000 units TABS Take 5,000 Units by mouth 2 (two) times daily.     Marland Kitchen gabapentin (NEURONTIN) 300 MG capsule Take 1 capsule (300 mg total) by mouth at bedtime. (Patient taking differently: Take 300 mg by mouth at bedtime. ) 90 capsule 4  . ibuprofen (ADVIL,MOTRIN) 200 MG tablet Take 800 mg by mouth every 8 (eight) hours as needed for mild pain (for pain.).     . Investigational ribociclib (KISQALI) 200 MG tablet [name] Take 2 tablets (400 mg total) by mouth daily. then 7 days off. 75 tablet 0  . letrozole (FEMARA) 2.5 MG tablet TAKE 1 TABLET BY MOUTH DAILY. 30 tablet 5  . loratadine (CLARITIN) 10 MG tablet Take 1 tablet (10 mg total) by mouth daily. (Patient taking differently: Take 10 mg by mouth daily as needed for allergies. ) 90 tablet 2  . Multiple Vitamin (MULTIVITAMIN WITH MINERALS) TABS tablet Take 1 tablet by mouth daily.     . Multiple Vitamins-Minerals (EMERGEN-C IMMUNE PO) Take by mouth as needed.    . Multiple Vitamins-Minerals (ZINC PO) Take 1 tablet by mouth daily.     . Omega-3 Fatty Acids (FISH OIL) 1000 MG CAPS Take 1,000 mg by mouth 2 (two) times daily.     . phenylephrine (SUDAFED PE) 10 MG TABS tablet Take 10 mg by mouth every 4 (four) hours as needed.    . Probiotic Product (PROBIOTIC DAILY PO) Take by mouth 2 (two) times daily.     Marland Kitchen senna (SENOKOT) 8.6 MG TABS tablet Take 1 tablet (8.6 mg total) by mouth 2 (two) times daily. (Patient taking differently: Take 1 tablet by mouth 2 (two) times daily. Patient takes prn) 120 each 0   No current facility-administered medications for this visit.    Facility-Administered Medications Ordered in Other Visits  Medication Dose  Route Frequency Provider Last Rate Last Dose  . sodium chloride flush (NS) 0.9 % injection 10 mL  10 mL Intravenous PRN Mackenzie [name], [name], MD   10 mL at 03/10/17 1239    OBJECTIVE: Middle-aged African-American woman in no acute distress There were no vitals filed for this visit.   There is no height or weight on file to calculate BMI.   Wt Readings from Last 3 Encounters:  09/20/18 262 lb 14.4 oz (119.3 kg)  08/31/18 257 lb (116.6 kg)  08/23/18 253 lb 6.4 oz (114.9 kg)   ECOG FS: 1 - Symptomatic but completely ambulatory Sclerae  unicteric, EOMs intact Oropharynx clear and moist No cervical or supraclavicular adenopathy Lungs no rales or rhonchi Heart regular rate and rhythm Abd soft, nontender, positive bowel sounds MSK no focal spinal tenderness, no upper extremity lymphedema Neuro: nonfocal, well oriented, appropriate affect Breasts: Deferred   LAB RESULTS:  No visits with results within 3 Day(s) from this visit.  Latest known visit with results is:  Appointment on 09/17/2018  Component Date Value Ref Range Status  . Uric Acid, Serum 09/17/2018 3.5  2.5 - 7.1 mg/dL Final   Performed at [facility], [place] [address], [place], [place] [address]  . Phosphorus 09/17/2018 4.0  2.5 - 4.6 mg/dL Final   Performed at [facility] [address], [place], [place] [address]  . Magnesium 09/17/2018 1.9  1.7 - 2.4 mg/dL Final   Performed at [facility], [place] [address], [place], [place] [address]  . LDH 09/17/2018 166  98 - 192 U/L Final   Performed at [facility], [place] [address] [place], [place] [address]  . Lipase 09/17/2018 26  11 - 51 U/L Final   Performed at [facility], [place] [address] [place], [place] [address]  . GGT 09/17/2018 20  7 - 50 U/L Final   Performed at [facility], [place] [address], [place], [place] [address]  . Sodium 09/17/2018 139  135 - 145  mmol/L Final  . Potassium 09/17/2018 3.9  3.5 - 5.1 mmol/L Final  . Chloride 09/17/2018 105  98 - 111 mmol/L Final  . CO2 09/17/2018 25  22 - 32 mmol/L Final  . Glucose, Bld 09/17/2018 106* 70 - 99 mg/dL Final  . BUN 09/17/2018 12  6 - 20 mg/dL Final  . Creatinine 09/17/2018 0.91  0.44 - 1.00 mg/dL Final  . Calcium 09/17/2018 8.7* 8.9 - 10.3 mg/dL Final  . Total Protein 09/17/2018 7.4  6.5 - 8.1 g/dL Final  . Albumin 09/17/2018 3.4* 3.5 - 5.0 g/dL Final  . AST 09/17/2018 16  15 - 41 U/L Final  . ALT 09/17/2018 18  0 - 44 U/L Final  . Alkaline Phosphatase 09/17/2018 127* 38 - 126 U/L Final  . Total Bilirubin 09/17/2018 <0.2* 0.3 - 1.2 mg/dL Final  . GFR, Est Non Af Am 09/17/2018 >60  >60 mL/min Final  . GFR, Est AFR Am 09/17/2018 >60  >60 mL/min Final  . Anion gap 09/17/2018 9  5 - 15 Final   Performed at [facility], [place] [address], [place],  [address]  . WBC Count 09/17/2018 3.7* 4.0 - 10.5 K/uL Final  . RBC 09/17/2018 3.92  3.87 - 5.11 MIL/uL Final  . Hemoglobin 09/17/2018 11.1* 12.0 - 15.0 g/dL Final  . HCT 09/17/2018 34.7* 36.0 - 46.0 % Final  . MCV 09/17/2018 88.5  80.0 - 100.0 fL Final  . MCH 09/17/2018 28.3  26.0 - 34.0 pg Final  . MCHC 09/17/2018 32.0  30.0 - 36.0 g/dL Final  . RDW 09/17/2018 15.7* 11.5 - 15.5 % Final  . Platelet Count 09/17/2018 236  150 - 400 K/uL Final  . nRBC 09/17/2018 0.0  0.0 - 0.2 % Final  . Neutrophils Relative % 09/17/2018 63  % Final  . Neutro Abs 09/17/2018 2.3  1.7 - 7.7 K/uL Final  . Lymphocytes Relative 09/17/2018 24  % Final  . Lymphs Abs 09/17/2018 0.9  0.7 - 4.0 K/uL Final  . Monocytes Relative  09/17/2018 11  % Final  . Monocytes Absolute 09/17/2018 0.4  0.1 - 1.0 K/uL Final  . Eosinophils Relative 09/17/2018 1  % Final  . Eosinophils Absolute 09/17/2018 0.0  0.0 - 0.5 K/uL Final  . Basophils Relative 09/17/2018 1  % Final  . Basophils Absolute 09/17/2018 0.0  0.0 - 0.1 K/uL Final  . Immature  Granulocytes 09/17/2018 0  % Final  . Abs Immature Granulocytes 09/17/2018 0.01  0.00 - 0.07 K/uL Final   Performed at [name], [place] [address] [place], [place] [address]  . Bilirubin, Direct 09/17/2018 <0.1  0.0 - 0.2 mg/dL Final   Performed at [facility] [address] [place], [place] [address]  . Amylase 09/17/2018 79  28 - 100 U/L Final   Performed at [facility], [place] [address] [place], [place] [address]     STUDIES: No results found.    ELIGIBLE FOR AVAILABLE RESEARCH PROTOCOL: [name], ASA study   ASSESSMENT: 48 y.o. [place] woman status post left breast upper inner quadrant biopsy 01/14/2017 for a clinical T3 N2, stage IIA invasive ductal carcinoma, grade 1 or 2, estrogen and progesterone receptor positive, HER-2 nonamplified, with an MIB-1 of 20%.  (1) staging studies: Brain MRI, bone scan, and CT scan of the chest 02/05/2017 showed no brain lesions, no lung or liver lesions, a 4.9 cm mass in the left breast with left axillary and subpectoral adenopathy, and nonspecific bone scan tracer at L2, left scapula, and anterior ribs, with lumbar spine MRI suggested for further evaluation.  (a) lumbar spine MRI 02/17/2017 showed no normal bone lesions.  There was mild lumbar spondylosis  (2) neoadjuvant chemotherapy consisting of cyclophosphamide and doxorubicin in dose dense fashion 4 started 02/10/2017, completed 03/24/2017, followed by weekly carboplatin and gemcitabine given days 1 and 8 of each 21-day cycle starting 04/14/2017, completing the planned 4 cycles 06/26/2017  (3) is post left modified radical mastectomy on 08/27/2017 showing an mpT3 pN2 residual invasive ductal carcinoma, grade 2, with a residual cancer burden of 3.  Margins were clear  (a) TRAM flap reconstruction planned for 09/13/2018  (4) postmastectomy radiation completed 01/01/2018  (a) capecitabine radiosensitization 11/16/2017-01/01/2018   (5) goserelin started 09/21/2017  (a) letrozole started 01/11/2018  (b) enrolled in [place] clinical trial, randomized to Ribociclib on 05/31/2018  (c) Bone density on 03/16/2018 was normal with T score of -0.7 in the L1-L4 spine   (6) genetics testing 03/24/2017 through the Common Hereditary Cancer Panel offered by [facility] found no deleterious mutations in APC, ATM, AXIN2, BARD1, BMPR1A, BRCA1, BRCA2, BRIP1, CDH1, CDKN2A (p14ARF), CDKN2A (p16INK4a), CHEK2, CTNNA1, DICER1, EPCAM (Deletion/duplication testing only), GREM1 (promoter region deletion/duplication testing only), KIT, MEN1, MLH1, MSH2, MSH3, MSH6, MUTYH, NBN, NF1, NHTL1, PALB2, PDGFRA, PMS2, POLD1, POLE, PTEN, RAD50, RAD51C, RAD51D, SDHB, SDHC, SDHD, SMAD4, SMARCA4. STK11, TP53, TSC1, TSC2, and VHL.  The following genes were evaluated for sequence changes only: SDHA and HOXB13 c.251G>A variant only.     PLAN: Mackenzie [name] is now a year out from definitive surgery for her breast cancer, with no evidence of disease recurrence.  This is very favorable.  She is tolerating her treatment generally well and does not give me any unusual side effects related to her therapy.  Her left breast reconstruction is being delayed because of the current pandemic but hopefully she can get it done in May or June.  Because she gets an EKG every 28 days and also has to stop by to  pick up her medicine every 28 days, we are not going to switch her Zoladex to every 3 months at this point, but we can do that once she comes off study.  She knows to call for any other issue that may develop before the next visit.  Shawny Borkowski, [name], MD  09/21/18 5:00 PM Medical Oncology and Hematology [facility] [address] [place],  [address] Tel. [phone]    Fax. [phone]  I, Jacqualyn Posey am acting as a Education administrator for Chauncey Cruel, MD.   I, Lurline Del MD, have reviewed the above documentation for accuracy and completeness, and I agree  with the above.  ADDENDUM: Patient had an EKG today which was normal.

## 2018-09-22 ENCOUNTER — Telehealth: Payer: Self-pay | Admitting: *Deleted

## 2018-09-22 NOTE — Telephone Encounter (Signed)
[name]: Called patient to obtain dose of Tumeric she reported starting.  Patient gave information about Tumeric dose and also clarified dose of Zinc.  Medication list updated.  Informed patient of next dates/ times for study appointments. Reminded patient to call if any questions or concerns prior to next appointment. She verbalized understanding.  Foye Spurling, BSN, RN Clinical Research Nurse 09/22/2018 1:31 PM

## 2018-09-23 ENCOUNTER — Telehealth: Payer: Self-pay | Admitting: Oncology

## 2018-09-23 NOTE — Telephone Encounter (Signed)
Returned patient's call about upcoming appointments, patient just wanted to confirm the date and time.

## 2018-09-24 ENCOUNTER — Encounter: Payer: 59 | Admitting: Plastic Surgery

## 2018-09-27 ENCOUNTER — Other Ambulatory Visit: Payer: Self-pay | Admitting: *Deleted

## 2018-09-30 ENCOUNTER — Other Ambulatory Visit: Payer: Self-pay | Admitting: *Deleted

## 2018-09-30 DIAGNOSIS — C50212 Malignant neoplasm of upper-inner quadrant of left female breast: Secondary | ICD-10-CM

## 2018-09-30 DIAGNOSIS — Z170 Estrogen receptor positive status [ER+]: Principal | ICD-10-CM

## 2018-10-13 ENCOUNTER — Telehealth: Payer: Self-pay | Admitting: *Deleted

## 2018-10-13 NOTE — Telephone Encounter (Signed)
Mackenzie [name]- Cycle 6 Labs; Called patient to remind her about lab appointment this Friday 5/8 at 9 am.  Reminded patient to fast for 8 hrs prior to lab.  Also reminded patient to not take Letrozole prior to the lab. Research nurse will call her later in the day with results. Patient verbalized understanding.  Informed patient there is a new consent form for this [name] which patient will be asked to sign on Friday before her lab appointment. Asked patient to come in at 8:30 am to meet with research nurse, Mackenzie [name], to review and sign consent prior to lab appointment. Patient agreed. Reviewed with patient new possible side effects of Ribociclib. Toxic Epidermal Necrolysis and Interstitial Lung Disease are included in consent form and frequency is unknown.  Also informed patient that consent includes request to use her specimens and data for future research.  Informed [name] will review consent with her in clinic Friday.  Patient denied any questions about the new consent form at this time.  Patient reports she has been rescheduled for breast reconstruction surgery on May 27th.  She says she was told if she is unable to do it on 5/27 then it would not get scheduled until later this year.  Informed patient this research nurse will ask Dr. Jana [name] when he wants her to start holding Ribociclib prior to surgery. Her surgery will be on day 17 of cycle 6.  She will also need lab work right before the surgery so we can plan on doing it in our clinic if it is not done by her surgeon.  Thanked patient for her time today and will call her Friday afternoon with lab results. Patient verbalized understanding.  Patient asked if her Disability paperwork had been signed by Dr. Jana [name] and sent in yet.  Informed patient I will leave messages for Dr. Virgie [name] nurse and Mackenzie [name], who was the nurse working on her paperwork.  I left messages with both nurse's VM asking them to let patient know status of her  Disability paperwork.  Foye Spurling, BSN, RN Clinical Research Nurse 10/13/2018 1:40 PM

## 2018-10-14 ENCOUNTER — Telehealth: Payer: Self-pay | Admitting: Plastic Surgery

## 2018-10-14 MED FILL — LETROZOLE 2.5 MG TABLET: 2.5 | 30 days supply | Qty: 30 | Fill #1 | Status: TO

## 2018-10-14 MED FILL — SYMBICORT 80-4.5 MCG INH: 80-4.5 | 30 days supply | Qty: 10 | Fill #0

## 2018-10-14 NOTE — Telephone Encounter (Signed)
Called patient to confirm appointment scheduled for tomorrow. Patient answered the following questions: °1.Has the patient traveled outside of the state of [name] at all within the past 6 weeks? No °2.Does the patient have a fever or cough at all? No °3.Has the patient been tested for COVID? Had a positive COVID test? No °4. Has the patient been in contact with anyone who has tested positive? No ° °

## 2018-10-14 NOTE — Progress Notes (Signed)
[facility]  [phone] [phone]) [phone]    ID: Mackenzie [name] DOB: 1971/04/15  MR#: [id number]  CVE#:[id number]  Patient Care Team: Willey Blade, MD as PCP - General (Internal Medicine) Talia Hoheisel, [name], MD as Consulting Physician (Oncology) Erroll Luna, MD as Consulting Physician (General Surgery) Kyung Rudd, MD as Consulting Physician (Radiation Oncology) Dillingham, Loel Lofty, DO as Attending Physician (Plastic Surgery) Cathlean Cower, RN as Registered Nurse Barrington Ellison, RN as [facility] OTHER MD:   CHIEF COMPLAINT: Estrogen receptor positive breast cancer  CURRENT TREATMENT: letrozole, goserelin; on [name] trial (ribociclib arm)   INTERVAL HISTORY: Mackenzie [name] returns today for follow-up and treatment of her estrogen receptor positive breast cancer.  She is being treated with goserelin and letrozole and then for the trial she receives ribociclib  She is tolerating the letrozole with fair tolerance. She reports hot flashes, stating she keeps taking clothes on and off throughout the day. She denies issues with vaginal dryness.  She also receives Goserelin every 4 weeks, with her most recent dose received on 09/20/2018.  She will receive a dose today.  She tolerates this well with no noticeable side effects.  Finally, she also continues on ribociclib. She also tolerates this well with no noticeable side effects.  Her left breast latissimus flap and replacement of tissue expander is scheduled for 11/03/2018 under Dr. Marla Roe.    REVIEW OF SYSTEMS: Mackenzie [name] reports doing okay overall. She is working 12 hours a week at [employer]. [employer]. She reports a coworker was diagnosed with Covid-19, so she had to undergo screening. 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 She continues cooking and cleaning the house as well.  The patient denies unusual  headaches, visual changes, nausea, vomiting, stiff neck, dizziness, or gait imbalance. There has been no cough, phlegm production, or pleurisy, no chest pain or pressure, and no change in bowel or bladder habits. The patient denies fever, rash, bleeding, unexplained fatigue or unexplained weight loss. A detailed review of systems was otherwise entirely negative.   HISTORY OF CURRENT ILLNESS: From the original intake note:  The patient herself noted some changes in her left breast late July 2018, she says, and eventually brought this to medical attention so that on 01/09/2017 she underwent bilateral diagnostic mammography with tomography and left breast ultrasonography at the breast Center. This found the breast density to be category C. In the upper inner quadrant of the left breast there was an area of asymmetry and there were malignant type calcifications involving all 4 quadrants. On exam there is firmness and palpable thickening in the anterior left breast with skin dimpling. Ultrasonography found at the 9:30 o'clock radiant 3 cm from the nipple a 2.7 cm mass and in the left axilla for abnormal lymph nodes largest of which measured 2.7 cm.  Biopsy of the left breast 9:30 o'clock mass 80 01/26/2017 showed ([id number]) invasive ductal carcinoma, with extracellular mucin, grade 1 or 2. In the lower outer left breast is separated biopsy the same day showed ductal carcinoma in situ. One of the 4 lymph nodes involved was positive for metastatic carcinoma. Prognostic panel on the invasive disease showed it to be estrogen receptor 100% positive, progesterone receptor 20% positive, both with strong staining intensity, with an MIB-1 of 20%, and no HER-2 amplification with a signals ratio 1.38 and the number per cell 2.90.  On 01/22/2017 the patient underwent bilateral breast  MRI. This showed no involvement of the right breast, but in the left breast there was a masslike and non-masslike enhancement involving  all quadrants, with skin swelling but no abnormal enhancement of the skin or nipple areolar complex or pectoralis muscle. The mass could not be clearly measured but spanned approximately 12 cm. There was bulky left axillary lymphadenopathy, with the largest lymph node measuring up to 3.4 cm.  The patient's subsequent history is as detailed below.   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, left ([facility]) 01/23/2017   Diabetes mellitus 2012   GDM   GERD (gastroesophageal reflux disease) 2018   Hot flashes 03/2017   Hyperlipidemia 03/20/2016   Hypertension 2012   Morbid obesity with body mass index (BMI) of 40.0 to 49.9 (H[facility]) 09/17/2017   NCGS (non-celiac gluten sensitivity)    Vitamin D deficiency 05/2015    PAST SURGICAL HISTORY: Past Surgical History:  Procedure Laterality Date   BREAST RECONSTRUCTION WITH PLACEMENT OF TISSUE EXPANDER AND FLEX HD (ACELLULAR HYDRATED DERMIS) Left 08/27/2017   Procedure: LEFT BREAST RECONSTRUCTION WITH PLACEMENT OF TISSUE EXPANDER AND FLEX HD;  Surgeon: Wallace Going, DO;  Location: [facility];  Service: Plastics;  Laterality: Left;   CESAREAN SECTION     x2   MASTECTOMY MODIFIED RADICAL Left 08/27/2017   MASTECTOMY MODIFIED RADICAL Left 08/27/2017   Procedure: LEFT MODIFIED RADICAL MASTECTOMY;  Surgeon: Erroll Luna, MD;  Location: [facility];  Service: General;  Laterality: Left;   PORT-A-CATH REMOVAL Right 08/27/2017   Procedure: REMOVAL PORT-A-CATH RIGHT CHEST;  Surgeon: Erroll Luna, MD;  Location: [facility];  Service: General;  Laterality: Right;   PORTACATH PLACEMENT Right 01/28/2017   Procedure: INSERTION PORT-A-CATH WITH ULTRASOUND;  Surgeon: Erroll Luna, MD;   Location: [facility];  Service: General;  Laterality: Right;   TUBAL LIGATION Bilateral 01/21/2011    FAMILY HISTORY Family History  Problem Relation Age of Onset   Breast cancer Paternal Grandmother 24       d.60s from breast cancer. Did not have treatment.   Other Mother        T.34 from complications of surgery to remove brain tumor   The patient's father still alive at age 71. The patient's mother died with a brain tumor which the patient says was "benign". She was 56. The patient has one brother, no sisters. The only breast cancer in the family is a paternal grandmother who died from breast cancer at an unknown age. There is no history of ovarian or prostate cancer in the family.   GYNECOLOGIC HISTORY:  No LMP recorded. (Menstrual status: Chemotherapy). Menarche age 65 and first live birth age 36 she is Mackenzie [name]. The patient is still having regular periods as of August 2018   SOCIAL HISTORY: (Updated 05/31/2018) Mackenzie [name] works at [employer] on the fifth floor, [place]. She is separated from her husband (as of 05/2018), [name], who is a Building control surveyor. Daughter is married and lives in [place] and works as a Education administrator. Leiann's daughter, [name], lives in [place] and works in the police department. Daughters, [place] and [place] are 107 and 6, living at home. The patient has a grandson she looks after. She attends a nondenominational  church.     ADVANCED DIRECTIVES: Not in place   HEALTH MAINTENANCE: Social History   Tobacco Use   Smoking status: Never Smoker   Smokeless tobacco: Never Used  Substance Use Topics   Alcohol use: Yes    Comment: social   Drug use: No    Colonoscopy: Never  PAP: December 2017  Bone density: Never   No Known Allergies  Current Outpatient Medications  Medication Sig Dispense Refill   acetaminophen (TYLENOL) 500 MG tablet Take 1,000 mg by mouth every 6 (six) hours as needed (for pain/headaches.).      B Complex  Vitamins (VITAMIN B COMPLEX PO) Take 1 capsule by mouth 2 (two) times daily.      budesonide-formoterol (SYMBICORT) 80-4.5 MCG/ACT inhaler Inhale 2 puffs into the lungs 2 (two) times daily.      Calcium Carb-Cholecalciferol (CALCIUM 600+D3 PO) Take 1 tablet by mouth 2 (two) times daily.      cephALEXin (KEFLEX) 500 MG capsule Take 1 capsule (500 mg total) by mouth 4 (four) times daily for 5 days. 20 capsule 0   cetirizine (ZYRTEC) 10 MG tablet Take 10 mg by mouth daily.      Cholecalciferol (VITAMIN D-3) 5000 units TABS Take 5,000 Units by mouth 2 (two) times daily.      diazepam (VALIUM) 2 MG tablet Take 1 tablet (2 mg total) by mouth every 8 (eight) hours as needed for muscle spasms. 30 tablet 0   gabapentin (NEURONTIN) 300 MG capsule Take 1 capsule (300 mg total) by mouth at bedtime. 90 capsule 4   HYDROcodone-acetaminophen (NORCO) 5-325 MG tablet Take 1 tablet by mouth every 6 (six) hours as needed for moderate pain. 30 tablet 0   ibuprofen (ADVIL,MOTRIN) 200 MG tablet Take 800 mg by mouth every 8 (eight) hours as needed for mild pain (for pain.).      Investigational ribociclib (KISQALI) 200 MG tablet [name] Take 2 tablets (400 mg total) by mouth daily. then 7 days off. 75 tablet 0   letrozole (FEMARA) 2.5 MG tablet Take 1 tablet (2.5 mg total) by mouth daily. 90 tablet 5   loratadine (CLARITIN) 10 MG tablet Take 1 tablet (10 mg total) by mouth daily. (Patient taking differently: Take 10 mg by mouth daily as needed for allergies. ) 90 tablet 2   Multiple Vitamin (MULTIVITAMIN WITH MINERALS) TABS tablet Take 1 tablet by mouth daily.      Omega-3 Fatty Acids (FISH OIL) 1000 MG CAPS Take 1,000 mg by mouth 2 (two) times daily.      phenylephrine (SUDAFED PE) 10 MG TABS tablet Take 10 mg by mouth every 4 (four) hours as needed.     Probiotic Product (PROBIOTIC DAILY PO) Take by mouth 2 (two) times daily.      prochlorperazine (COMPAZINE) 10 MG tablet Take 1 tablet (10 mg  total) by mouth every 6 (six) hours as needed for nausea or vomiting. 30 tablet 0   senna (SENOKOT) 8.6 MG TABS tablet Take 1 tablet (8.6 mg total) by mouth 2 (two) times daily. (Patient taking differently: Take 1 tablet by mouth 2 (two) times daily. Patient takes prn) 120 each 0   Turmeric 450 MG CAPS Take 450 mg by mouth 2 (two) times daily.     No current facility-administered medications for this visit.    Facility-Administered Medications Ordered in Other Visits  Medication Dose Route Frequency Provider Last Rate Last Dose   sodium chloride flush (NS) 0.9 % injection 10 mL  10 mL Intravenous PRN Judieth Mckown, [name], MD   10 mL at 03/10/17 1239    OBJECTIVE: Middle-aged African-American woman who appears stated age 48:   10/18/18 0937  BP: 124/62  Pulse: 89  Resp: 18  Temp: 98.5 F (36.9 C)  SpO2: 99%     Body mass index is 41.66 kg/m.   Wt Readings from Last 3 Encounters:  10/18/18 258 lb 1.6 oz (117.1 kg)  10/15/18 258 lb 12.8 oz (117.4 kg)  09/20/18 262 lb 14.4 oz (119.3 kg)   ECOG FS: 1 - Symptomatic but completely ambulatory  Sclerae unicteric, pupils round and equal Wearing a mask No cervical or supraclavicular adenopathy Lungs no rales or rhonchi Heart regular rate and rhythm Abd soft, nontender, positive bowel sounds MSK no focal spinal tenderness, no upper extremity lymphedema Neuro: nonfocal, well oriented, appropriate affect Breasts: The right breast is unremarkable.  The left breast is status post mastectomy with expander in place.  There is some hyperpigmentation from the radiation.  There is no evidence of disease recurrence.  Both axillae are benign.   LAB RESULTS:  No visits with results within 3 Day(s) from this visit.  Latest known visit with results is:  Appointment on 10/15/2018  Component Date Value Ref Range Status   GGT 10/15/2018 27  7 - 50 U/L Final   Performed at [facility], [place] [address] [place], [place] [address]    Lipase 10/15/2018 24  11 - 51 U/L Final   Performed at [facility], [place] [address] [place], [place] [address]   Amylase 10/15/2018 65  28 - 100 U/L Final   Performed at [facility], [place] [address], [place], [place] [address]   LDH 10/15/2018 149  98 - 192 U/L Final   Performed at [facility], [place] [address], [place], [place] [address]   Bilirubin, Direct 10/15/2018 0.1  0.0 - 0.2 mg/dL Final   Performed at [facility] [address] [place], [place] [address]   Uric Acid, Serum 10/15/2018 3.9  2.5 - 7.1 mg/dL Final   Performed at [facility], [place] [address] [place], [place] [address]   Phosphorus 10/15/2018 4.1  2.5 - 4.6 mg/dL Final   Performed at [place] [address], [place], [place] [address]   Magnesium 10/15/2018 1.8  1.7 - 2.4 mg/dL Final   Performed at [facility], [place] [address] [place], [place] [address]   Sodium 10/15/2018 136  135 - 145 mmol/L Final   Potassium 10/15/2018 4.1  3.5 - 5.1 mmol/L Final   Chloride 10/15/2018 101  98 - 111 mmol/L Final   CO2 10/15/2018 27  22 - 32 mmol/L Final   Glucose, Bld 10/15/2018 112* 70 - 99 mg/dL Final   BUN 10/15/2018 13  6 - 20 mg/dL Final   Creatinine 10/15/2018 1.02* 0.44 - 1.00 mg/dL Final   Calcium 10/15/2018 9.5  8.9 - 10.3 mg/dL Final   Total Protein 10/15/2018 8.2* 6.5 - 8.1 g/dL Final   Albumin 10/15/2018 3.9  3.5 - 5.0 g/dL Final   AST 10/15/2018 20  15 - 41 U/L Final   ALT 10/15/2018 31  0 - 44 U/L Final   Alkaline Phosphatase 10/15/2018 138* 38 - 126 U/L Final   Total Bilirubin 10/15/2018 0.3  0.3 - 1.2 mg/dL Final   GFR, Est Non Af Am 10/15/2018 >60  >60 mL/min  Final   GFR, Est AFR Am 10/15/2018 >60  >60 mL/min Final   Anion gap 10/15/2018 8  5 - 15 Final   Performed at [facility], [place] [address],  [place], [place] [address]   WBC Count 10/15/2018 3.9* 4.0 - 10.5 K/uL Final   RBC 10/15/2018 4.11  3.87 - 5.11 MIL/uL Final   Hemoglobin 10/15/2018 11.7* 12.0 - 15.0 g/dL Final   HCT 10/15/2018 37.0  36.0 - 46.0 % Final   MCV 10/15/2018 90.0  80.0 - 100.0 fL Final   MCH 10/15/2018 28.5  26.0 - 34.0 pg Final   MCHC 10/15/2018 31.6  30.0 - 36.0 g/dL Final   RDW 10/15/2018 14.6  11.5 - 15.5 % Final   Platelet Count 10/15/2018 245  150 - 400 K/uL Final   nRBC 10/15/2018 0.0  0.0 - 0.2 % Final   Neutrophils Relative % 10/15/2018 64  % Final   Neutro Abs 10/15/2018 2.5  1.7 - 7.7 K/uL Final   Lymphocytes Relative 10/15/2018 21  % Final   Lymphs Abs 10/15/2018 0.8  0.7 - 4.0 K/uL Final   Monocytes Relative 10/15/2018 13  % Final   Monocytes Absolute 10/15/2018 0.5  0.1 - 1.0 K/uL Final   Eosinophils Relative 10/15/2018 1  % Final   Eosinophils Absolute 10/15/2018 0.0  0.0 - 0.5 K/uL Final   Basophils Relative 10/15/2018 1  % Final   Basophils Absolute 10/15/2018 0.0  0.0 - 0.1 K/uL Final   Immature Granulocytes 10/15/2018 0  % Final   Abs Immature Granulocytes 10/15/2018 0.01  0.00 - 0.07 K/uL Final   Performed at [facility], [place] [address] [place], [place] [address]     STUDIES: No results found.    ELIGIBLE FOR AVAILABLE RESEARCH PROTOCOL: [name], ASA study   ASSESSMENT: 48 y.o. Mackenzie [name] woman status post left breast upper inner quadrant biopsy 01/14/2017 for a clinical T3 N2, stage IIA invasive ductal carcinoma, grade 1 or 2, estrogen and progesterone receptor positive, HER-2 nonamplified, with an MIB-1 of 20%.  (1) staging studies: Brain MRI, bone scan, and CT scan of the chest 02/05/2017 showed no brain lesions, no lung or liver lesions, a 4.9 cm mass in the left breast with left axillary and subpectoral adenopathy, and nonspecific bone scan tracer at L2, left scapula, and anterior ribs, with lumbar spine MRI suggested for further  evaluation.  (a) lumbar spine MRI 02/17/2017 showed no normal bone lesions.  There was mild lumbar spondylosis  (2) neoadjuvant chemotherapy consisting of cyclophosphamide and doxorubicin in dose dense fashion 4 started 02/10/2017, completed 03/24/2017, followed by weekly carboplatin and gemcitabine given days 1 and 8 of each 21-day cycle starting 04/14/2017, completing the planned 4 cycles 06/26/2017  (3) is post left modified radical mastectomy on 08/27/2017 showing an mpT3 pN2 residual invasive ductal carcinoma, grade 2, with a residual cancer burden of 3.  Margins were clear  (a) TRAM flap reconstruction planned for 11/03/2018  (4) postmastectomy radiation completed 01/01/2018  (a) capecitabine radiosensitization 11/16/2017-01/01/2018  (5) goserelin started 09/21/2017  (a) letrozole started 01/11/2018  (b) enrolled in [place] clinical trial, randomized to ribociclib on 05/31/2018  (c) Bone density on 03/16/2018 was normal with T score of -0.7 in the L1-L4 spine   (6) genetics testing 03/24/2017 through the Common Hereditary Cancer Panel offered by [facility] found no deleterious mutations in APC, ATM, AXIN2, BARD1, BMPR1A, BRCA1, BRCA2, BRIP1, CDH1, CDKN2A (p14ARF), CDKN2A (p16INK4a), CHEK2, CTNNA1, DICER1, EPCAM (Deletion/duplication  testing only), GREM1 (promoter region deletion/duplication testing only), KIT, MEN1, MLH1, MSH2, MSH3, MSH6, MUTYH, NBN, NF1, NHTL1, PALB2, PDGFRA, PMS2, POLD1, POLE, PTEN, RAD50, RAD51C, RAD51D, SDHB, SDHC, SDHD, SMAD4, SMARCA4. STK11, TP53, TSC1, TSC2, and VHL.  The following genes were evaluated for sequence changes only: SDHA and HOXB13 c.251G>A variant only.     PLAN: Mackenzie [name] is now a little over 1 year out from definitive surgery for her breast cancer with no evidence of disease recurrence.  This is favorable.  She is tolerating the goserelin and letrozole well.  She is having significant hot flashes.  The [place] is helping some at bedtime.  She will be  starting her ribociclib cycle today.  However she is only going to take a week on this cycle because I do not want her to be immunocompromised before her surgery.  Her last dose will be 04/27/2019.  Given the protocol details we will not resume the ribociclib until she begins the next cycle which will be 11/15/2018.  She will see me the same day.  At the next visit we will consider adding venlafaxine.  I would rather wait until after the surgery to make any other medication changes.  We did discontinue the ondansetron which might interfere with the ribociclib.  I prescribed Compazine instead.  I have encouraged L seen to call us for any other issue that may develop before her next visit here.    Brandyce Dimario, [name], MD  10/18/18 10:00 AM Medical Oncology and Hematology [facility] [address] [place], [place] [address] Tel. [phone]    Fax. [phone]   I, Wilburn Mylar, am acting as scribe for Dr. [name]. Mackenzie [name].  I, Lurline Del MD, have reviewed the above documentation for accuracy and completeness, and I agree with the above.

## 2018-10-15 ENCOUNTER — Ambulatory Visit (INDEPENDENT_AMBULATORY_CARE_PROVIDER_SITE_OTHER): Payer: 59 | Admitting: Plastic Surgery

## 2018-10-15 ENCOUNTER — Telehealth: Payer: Self-pay | Admitting: *Deleted

## 2018-10-15 ENCOUNTER — Encounter: Payer: Self-pay | Admitting: Plastic Surgery

## 2018-10-15 ENCOUNTER — Inpatient Hospital Stay: Payer: 59 | Admitting: Medical Oncology

## 2018-10-15 ENCOUNTER — Other Ambulatory Visit: Payer: Self-pay

## 2018-10-15 ENCOUNTER — Encounter: Payer: Self-pay | Admitting: Medical Oncology

## 2018-10-15 ENCOUNTER — Inpatient Hospital Stay: Payer: 59 | Attending: Adult Health

## 2018-10-15 VITALS — BP 151/87 | HR 85 | Temp 98.5°F | Ht 66.0 in | Wt 258.8 lb

## 2018-10-15 DIAGNOSIS — Z170 Estrogen receptor positive status [ER+]: Secondary | ICD-10-CM

## 2018-10-15 DIAGNOSIS — Z923 Personal history of irradiation: Secondary | ICD-10-CM | POA: Insufficient documentation

## 2018-10-15 DIAGNOSIS — Z9012 Acquired absence of left breast and nipple: Secondary | ICD-10-CM

## 2018-10-15 DIAGNOSIS — C50812 Malignant neoplasm of overlapping sites of left female breast: Secondary | ICD-10-CM | POA: Insufficient documentation

## 2018-10-15 DIAGNOSIS — C50212 Malignant neoplasm of upper-inner quadrant of left female breast: Secondary | ICD-10-CM

## 2018-10-15 DIAGNOSIS — Z79899 Other long term (current) drug therapy: Secondary | ICD-10-CM | POA: Insufficient documentation

## 2018-10-15 DIAGNOSIS — Z006 Encounter for examination for normal comparison and control in clinical research program: Secondary | ICD-10-CM | POA: Insufficient documentation

## 2018-10-15 DIAGNOSIS — Z79811 Long term (current) use of aromatase inhibitors: Secondary | ICD-10-CM | POA: Insufficient documentation

## 2018-10-15 DIAGNOSIS — Z9221 Personal history of antineoplastic chemotherapy: Secondary | ICD-10-CM | POA: Insufficient documentation

## 2018-10-15 DIAGNOSIS — Z5111 Encounter for antineoplastic chemotherapy: Secondary | ICD-10-CM | POA: Insufficient documentation

## 2018-10-15 DIAGNOSIS — R232 Flushing: Secondary | ICD-10-CM | POA: Insufficient documentation

## 2018-10-15 LAB — CBC WITH DIFFERENTIAL (CANCER CENTER ONLY)
Abs Immature Granulocytes: 0.01 10*3/uL (ref 0.00–0.07)
Basophils Absolute: 0 10*3/uL (ref 0.0–0.1)
Basophils Relative: 1 %
Eosinophils Absolute: 0 10*3/uL (ref 0.0–0.5)
Eosinophils Relative: 1 %
HCT: 37 % (ref 36.0–46.0)
Hemoglobin: 11.7 g/dL — ABNORMAL LOW (ref 12.0–15.0)
Immature Granulocytes: 0 %
Lymphocytes Relative: 21 %
Lymphs Abs: 0.8 10*3/uL (ref 0.7–4.0)
MCH: 28.5 pg (ref 26.0–34.0)
MCHC: 31.6 g/dL (ref 30.0–36.0)
MCV: 90 fL (ref 80.0–100.0)
Monocytes Absolute: 0.5 10*3/uL (ref 0.1–1.0)
Monocytes Relative: 13 %
Neutro Abs: 2.5 10*3/uL (ref 1.7–7.7)
Neutrophils Relative %: 64 %
Platelet Count: 245 10*3/uL (ref 150–400)
RBC: 4.11 MIL/uL (ref 3.87–5.11)
RDW: 14.6 % (ref 11.5–15.5)
WBC Count: 3.9 10*3/uL — ABNORMAL LOW (ref 4.0–10.5)
nRBC: 0 % (ref 0.0–0.2)

## 2018-10-15 LAB — URIC ACID: Uric Acid, Serum: 3.9 mg/dL (ref 2.5–7.1)

## 2018-10-15 LAB — CMP (CANCER CENTER ONLY)
ALT: 31 U/L (ref 0–44)
AST: 20 U/L (ref 15–41)
Albumin: 3.9 g/dL (ref 3.5–5.0)
Alkaline Phosphatase: 138 U/L — ABNORMAL HIGH (ref 38–126)
Anion gap: 8 (ref 5–15)
BUN: 13 mg/dL (ref 6–20)
CO2: 27 mmol/L (ref 22–32)
Calcium: 9.5 mg/dL (ref 8.9–10.3)
Chloride: 101 mmol/L (ref 98–111)
Creatinine: 1.02 mg/dL — ABNORMAL HIGH (ref 0.44–1.00)
GFR, Est AFR Am: >60 mL/min (ref >60)
GFR, Estimated: >60 mL/min (ref >60)
Glucose, Bld: 112 mg/dL — ABNORMAL HIGH (ref 70–99)
Potassium: 4.1 mmol/L (ref 3.5–5.1)
Sodium: 136 mmol/L (ref 135–145)
Total Bilirubin: 0.3 mg/dL (ref 0.3–1.2)
Total Protein: 8.2 g/dL — ABNORMAL HIGH (ref 6.5–8.1)

## 2018-10-15 LAB — MAGNESIUM: Magnesium: 1.8 mg/dL (ref 1.7–2.4)

## 2018-10-15 LAB — LACTATE DEHYDROGENASE: LDH: 149 U/L (ref 98–192)

## 2018-10-15 LAB — GAMMA GT: GGT: 27 U/L (ref 7–50)

## 2018-10-15 LAB — BILIRUBIN, DIRECT: Bilirubin, Direct: 0.1 mg/dL (ref 0.0–0.2)

## 2018-10-15 LAB — LIPASE, BLOOD: Lipase: 24 U/L (ref 11–51)

## 2018-10-15 LAB — PHOSPHORUS: Phosphorus: 4.1 mg/dL (ref 2.5–4.6)

## 2018-10-15 LAB — AMYLASE: Amylase: 65 U/L (ref 28–100)

## 2018-10-15 MED ORDER — HYDROCODONE-ACETAMINOPHEN 5-325 MG PO TABS: 1.0000 | ORAL_TABLET | Freq: Four times a day (QID) | ORAL | 0 refills | Status: DC | PRN

## 2018-10-15 MED ORDER — ONDANSETRON HCL 4 MG PO TABS: 4.0000 mg | ORAL_TABLET | Freq: Three times a day (TID) | ORAL | 0 refills | Status: DC | PRN

## 2018-10-15 MED ORDER — CEPHALEXIN 500 MG PO CAPS: 500.0000 mg | ORAL_CAPSULE | Freq: Four times a day (QID) | ORAL | 0 refills | Status: AC

## 2018-10-15 MED ORDER — DIAZEPAM 2 MG PO TABS: 2.0000 mg | ORAL_TABLET | Freq: Three times a day (TID) | ORAL | 0 refills | Status: DC | PRN

## 2018-10-15 NOTE — Progress Notes (Signed)
[name] (Re-Consent) I met with patient this morning prior to her study lab appointments, to review the study's new consent form. Patient had spoken with research nurse Foye Spurling, earlier this week via phone. Cameo had reviewed with patient new information, in this consent, regarding "nosebleeds" and the addition of Toxic Epidermal Necrolysis and Interstitial Lung Disease information. I reviewed with patient, page by page, the study changes and updates in the consent form. After all of patient's questions were answered to her satisfaction, patient proceeded to sign and date the consent form, [id number] ([id number]), Version 3.0 dated 01-Jul-2018, where indicated. Patient consented to take part in the additional Pharmacogenetic sample collection as was described in this informed consent, section 13, as well as, consenting to Optional additional research using her personal data and biological samples as described in section 14.  Patient was provided with a copy of her signed study consent form for her records. Patient was also given the study provided "Patient Emergency" wallet card, on card stock, to carry with her.    After the completion of re-consenting patient for study, I confirmed with patient that she has been fasting for 8 hours and that she had not taken her Letrozole this morning. Patient then proceeded to her scheduled lab appointment for study Cycle 6 labs.  Patient was thanked for her time and continued support of study and was encouraged to call Dr. Jana Hakim or Cameo with any questions or concerns she may have.  Maxwell Marion, RN, BSN, [facility] Clinical Research 10/15/2018 10:48 AM.

## 2018-10-15 NOTE — Progress Notes (Signed)
Visit completed. See encounter note.  Maxwell Marion, RN, BSN, [facility] Clinical Research 10/15/2018 11:49 AM

## 2018-10-15 NOTE — Progress Notes (Signed)
Patient ID: Mackenzie [name], female    DOB: Jan 25, 1971, 48 y.o.   MRN: [id number]   Chief Complaint  Patient presents with  . Pre-op Exam    (L) breast reconstruction    The patient is a 48 year old female here for a history and physical for left breast reconstruction.  She underwent a left mastectomy with reconstruction using an expander and Flex HD.  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  She underwent chemotherapy and radiation that ended in July 2019 her preoperative bra size was a 42 C.  She would like to be around the same size.  She is 5 feet 7 inches tall and weighs 250 pounds.  Due to the radiation she was not able to be expanded any further and she has decided on autologous reconstruction.  She has waffled between a TRAM and a latissimus.  Last visit she decided on a TRAM.  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  She has been doing well and does not have any signs of COVID.  The left breast incision is healed and there is no sign of infection.  The expander is in place.   Review of Systems  Constitutional: Negative.  Negative for activity change and appetite change.  HENT: Negative.   Eyes: Negative.   Respiratory: Negative.   Cardiovascular: Negative.  Negative for leg swelling.  Gastrointestinal: Negative.   Endocrine: Negative.   Genitourinary: Negative.   Musculoskeletal: Negative.   Skin: Negative for color change and wound.  Neurological: Negative.   Hematological: Negative.   Psychiatric/Behavioral: Negative.     Past Medical History:  Diagnosis Date  . Abnormal glucose 2018  . 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 Allergic rhinitis 2012  . Anemia 01/27/2017   prior to starting chemotherapy  . Breast cancer ([place]) 01/14/2017   Left breast  . 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 Diabetes mellitus  2012   GDM  . GERD (gastroesophageal reflux disease) 2018  . Hot flashes 03/2017  . Hyperlipidemia 03/20/2016  . Hypertension 2012  . Morbid obesity with body mass index (BMI) of 40.0 to 49.9 ([place]) 09/17/2017  . NCGS (non-celiac gluten sensitivity)   . Vitamin D deficiency 05/2015    Past Surgical History:  Procedure Laterality Date  . BREAST RECONSTRUCTION WITH PLACEMENT OF TISSUE EXPANDER AND FLEX HD (ACELLULAR HYDRATED DERMIS) Left 08/27/2017   Procedure: LEFT BREAST RECONSTRUCTION WITH PLACEMENT OF TISSUE EXPANDER AND FLEX HD;  Surgeon: Wallace Going, DO;  Location: [facility];  Service: Plastics;  Laterality: Left;  . CESAREAN SECTION     x2  . MASTECTOMY MODIFIED RADICAL Left 08/27/2017  . MASTECTOMY MODIFIED RADICAL Left 08/27/2017   Procedure: LEFT MODIFIED RADICAL MASTECTOMY;  Surgeon: Erroll Luna, MD;  Location: [facility];  Service: General;  Laterality: Left;  . PORT-A-CATH REMOVAL Right 08/27/2017   Procedure: REMOVAL PORT-A-CATH RIGHT CHEST;  Surgeon: Erroll Luna, MD;  Location: [facility];  Service: General;  Laterality: Right;  . PORTACATH PLACEMENT Right 01/28/2017   Procedure: INSERTION PORT-A-CATH WITH ULTRASOUND;  Surgeon: Erroll Luna, MD;  Location: [facility];  Service: General;  Laterality: Right;  . TUBAL LIGATION Bilateral 01/21/2011      Current Outpatient Medications:  .  acetaminophen (TYLENOL) 500 MG tablet, Take 1,000 mg by mouth every 6 (six)  hours as needed (for pain/headaches.). , Disp: , Rfl:  .  B Complex Vitamins (VITAMIN B COMPLEX PO), Take 1 capsule by mouth 2 (two) times daily. , Disp: , Rfl:  .  benzonatate (TESSALON) 100 MG capsule, Take 100 mg by mouth every 8 (eight) hours as needed for cough. , Disp: , Rfl: 0 .  budesonide-formoterol (SYMBICORT) 80-4.5 MCG/ACT inhaler, Inhale 2 puffs into the lungs 2 (two) times daily. , Disp: , Rfl:  .  Calcium Carb-Cholecalciferol (CALCIUM 600+D3 PO), Take 1 tablet by mouth 2 (two) times  daily. , Disp: , Rfl:  .  cetirizine (ZYRTEC) 10 MG tablet, Take 10 mg by mouth daily. , Disp: , Rfl:  .  Cholecalciferol (VITAMIN D-3) 5000 units TABS, Take 5,000 Units by mouth 2 (two) times daily. , Disp: , Rfl:  .  gabapentin (NEURONTIN) 300 MG capsule, Take 1 capsule (300 mg total) by mouth at bedtime. (Patient taking differently: Take 300 mg by mouth at bedtime. ), Disp: 90 capsule, Rfl: 4 .  ibuprofen (ADVIL,MOTRIN) 200 MG tablet, Take 800 mg by mouth every 8 (eight) hours as needed for mild pain (for pain.). , Disp: , Rfl:  .  Investigational ribociclib (KISQALI) 200 MG tablet [name], Take 2 tablets (400 mg total) by mouth daily. then 7 days off., Disp: 75 tablet, Rfl: 0 .  letrozole (FEMARA) 2.5 MG tablet, TAKE 1 TABLET BY MOUTH DAILY., Disp: 30 tablet, Rfl: 5 .  loratadine (CLARITIN) 10 MG tablet, Take 1 tablet (10 mg total) by mouth daily. (Patient taking differently: Take 10 mg by mouth daily as needed for allergies. ), Disp: 90 tablet, Rfl: 2 .  Multiple Vitamin (MULTIVITAMIN WITH MINERALS) TABS tablet, Take 1 tablet by mouth daily. , Disp: , Rfl:  .  Multiple Vitamins-Minerals (EMERGEN-C IMMUNE PO), Take by mouth as needed., Disp: , Rfl:  .  Multiple Vitamins-Minerals (ZINC PO), Take 220 tablets by mouth 2 (two) times daily. , Disp: , Rfl:  .  Omega-3 Fatty Acids (FISH OIL) 1000 MG CAPS, Take 1,000 mg by mouth 2 (two) times daily. , Disp: , Rfl:  .  phenylephrine (SUDAFED PE) 10 MG TABS tablet, Take 10 mg by mouth every 4 (four) hours as needed., Disp: , Rfl:  .  Probiotic Product (PROBIOTIC DAILY PO), Take by mouth 2 (two) times daily. , Disp: , Rfl:  .  senna (SENOKOT) 8.6 MG TABS tablet, Take 1 tablet (8.6 mg total) by mouth 2 (two) times daily. (Patient taking differently: Take 1 tablet by mouth 2 (two) times daily. Patient takes prn), Disp: 120 each, Rfl: 0 .  Turmeric 450 MG CAPS, Take 450 mg by mouth 2 (two) times daily., Disp: , Rfl:  No current facility-administered  medications for this visit.   Facility-Administered Medications Ordered in Other Visits:  .  sodium chloride flush (NS) 0.9 % injection 10 mL, 10 mL, Intravenous, PRN, Magrinat, [name], MD, 10 mL at 03/10/17 1239   Objective:   Vitals:   10/15/18 1112  BP: (!) 151/87  Pulse: 85  Temp: 98.5 F (36.9 C)  SpO2: 99%    Physical Exam Vitals signs and nursing note reviewed.  Constitutional:      Appearance: Normal appearance.  HENT:     Head: Normocephalic and atraumatic.     Nose: Nose normal.     Mouth/Throat:     Mouth: Mucous membranes are moist.  Eyes:     Extraocular Movements: Extraocular movements intact.  Cardiovascular:  Rate and Rhythm: Normal rate.     Pulses: Normal pulses.  Pulmonary:     Effort: Pulmonary effort is normal.  Abdominal:     General: Abdomen is flat. There is no distension.     Tenderness: There is no abdominal tenderness.  Skin:    General: Skin is warm.  Neurological:     General: No focal deficit present.     Mental Status: She is alert and oriented to person, place, and time.  Psychiatric:        Mood and Affect: Mood normal.        Behavior: Behavior normal.     Assessment & Plan:  Malignant neoplasm of upper-inner quadrant of left breast in female, estrogen receptor positive ([place])  Acquired absence of left breast  We had a long discussion about the risks and complications of the surgery. The risk that can be encountered for this procedure were discussed and include the following but not limited to these: asymmetry, fluid accumulation, firmness of the tissue, skin loss, decrease or no sensation, fat necrosis, bleeding, infection, healing delay.  Deep vein thrombosis, cardiac and pulmonary complications are risks to any procedure.  There are risks of anesthesia, changes to skin sensation and injury to nerves or blood vessels.  The muscle can be temporarily or permanently injured.  You may have an allergic reaction to tape, suture,  glue, blood products which can result in skin discoloration, swelling, pain, skin lesions, poor healing.  Any of these can lead to the need for revisonal surgery or stage procedures.  Weight gain and weigh loss can also effect the long term appearance. The results are not guaranteed to last a lifetime.  Future surgery may be required.    She is now thinking that a latissimus may be better due to the decrease in risks that would slow her down and affect her return to work and ability to care for her grandchild.   McNary, DO

## 2018-10-15 NOTE — Telephone Encounter (Signed)
Phone call prior to C6D1 of [place] Study: Called patient on phone to complete some study assessments prior to clinic visit on Monday.  This is done to limit amount of time patient has to spend in clinic due to safety concerns of [place] outbreak.  Labs: Completed today and all results are within study parameters to start on Cycle 6 of Ribociclib as scheduled for Monday 10/18/18.  Con Meds: Reviewed medication list with patient. Medications prescribed by Dr. Marla Roe today are to start after surgery per patient. Cephalexin and Hydrocodone are to be used with caution while taking Ribociclib. Diazepam is allowed on study. Ondansetron is prohibited on study.  Informed patient of this and she agreed ok to ask Dr. Marla Roe to change Ondasetron to Compazine.  Patient states she has taken Compazine in the past and tolerated well. Staff message sent to Dr. Marla Roe to alert her to patient taking study drug and ask if she will change Ondansetron to Compazine.  Patient states her medication list is current and up to date. She denies any other changes other than post op prescriptions.  AEs: Patient reports bilateral leg aches have improved in the past 2 weeks back to baseline. She denies any other new or worsening AEs.  Thanked patient for her time today.  Reminded patient to not take any Ribociclib or Letrozole Monday morning as she will need to take them in the clinic. Reminded patient to bring her Medication diary and Medication bottle of Ribociclib and Letrozole with her to visit on Monday.  Informed patient that research nurse, Johney Maine, will see her in clinic, complete ECG, dispense medications and obtain any updates from patient regarding medications or AEs. Patient verbalized understanding.  Foye Spurling, BSN, RN Clinical Research Nurse 10/15/2018 3:13 PM

## 2018-10-15 NOTE — H&P (View-Only) (Signed)
Patient ID: Mackenzie [name], female    DOB: 10-23-1970, 48 y.o.   MRN: [id number]   Chief Complaint  Patient presents with  . Pre-op Exam    (L) breast reconstruction    The patient is a 48 year old female here for a history and physical for left breast reconstruction.  She underwent a left mastectomy with reconstruction using an expander and Flex HD.  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  She underwent chemotherapy and radiation that ended in July 2019 her preoperative bra size was a 42 C.  She would like to be around the same size.  She is 5 feet 7 inches tall and weighs 250 pounds.  Due to the radiation she was not able to be expanded any further and she has decided on autologous reconstruction.  She has waffled between a TRAM and a latissimus.  Last visit she decided on a TRAM.  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  She has been doing well and does not have any signs of COVID.  The left breast incision is healed and there is no sign of infection.  The expander is in place.   Review of Systems  Constitutional: Negative.  Negative for activity change and appetite change.  HENT: Negative.   Eyes: Negative.   Respiratory: Negative.   Cardiovascular: Negative.  Negative for leg swelling.  Gastrointestinal: Negative.   Endocrine: Negative.   Genitourinary: Negative.   Musculoskeletal: Negative.   Skin: Negative for color change and wound.  Neurological: Negative.   Hematological: Negative.   Psychiatric/Behavioral: Negative.     Past Medical History:  Diagnosis Date  . Abnormal glucose 2018  . 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 Allergic rhinitis 2012  . Anemia 01/27/2017   prior to starting chemotherapy  . Breast cancer ([place]) 01/14/2017   Left breast  . 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 Diabetes mellitus  2012   GDM  . GERD (gastroesophageal reflux disease) 2018  . Hot flashes 03/2017  . Hyperlipidemia 03/20/2016  . Hypertension 2012  . Morbid obesity with body mass index (BMI) of 40.0 to 49.9 ([place]) 09/17/2017  . NCGS (non-celiac gluten sensitivity)   . Vitamin D deficiency 05/2015    Past Surgical History:  Procedure Laterality Date  . BREAST RECONSTRUCTION WITH PLACEMENT OF TISSUE EXPANDER AND FLEX HD (ACELLULAR HYDRATED DERMIS) Left 08/27/2017   Procedure: LEFT BREAST RECONSTRUCTION WITH PLACEMENT OF TISSUE EXPANDER AND FLEX HD;  Surgeon: Wallace Going, DO;  Location: [facility];  Service: Plastics;  Laterality: Left;  . CESAREAN SECTION     x2  . MASTECTOMY MODIFIED RADICAL Left 08/27/2017  . MASTECTOMY MODIFIED RADICAL Left 08/27/2017   Procedure: LEFT MODIFIED RADICAL MASTECTOMY;  Surgeon: Erroll Luna, MD;  Location: [facility];  Service: General;  Laterality: Left;  . PORT-A-CATH REMOVAL Right 08/27/2017   Procedure: REMOVAL PORT-A-CATH RIGHT CHEST;  Surgeon: Erroll Luna, MD;  Location: [facility];  Service: General;  Laterality: Right;  . PORTACATH PLACEMENT Right 01/28/2017   Procedure: INSERTION PORT-A-CATH WITH ULTRASOUND;  Surgeon: Erroll Luna, MD;  Location: [facility];  Service: General;  Laterality: Right;  . TUBAL LIGATION Bilateral 01/21/2011      Current Outpatient Medications:  .  acetaminophen (TYLENOL) 500 MG tablet, Take 1,000 mg by mouth every 6 (six)  hours as needed (for pain/headaches.). , Disp: , Rfl:  .  B Complex Vitamins (VITAMIN B COMPLEX PO), Take 1 capsule by mouth 2 (two) times daily. , Disp: , Rfl:  .  benzonatate (TESSALON) 100 MG capsule, Take 100 mg by mouth every 8 (eight) hours as needed for cough. , Disp: , Rfl: 0 .  budesonide-formoterol (SYMBICORT) 80-4.5 MCG/ACT inhaler, Inhale 2 puffs into the lungs 2 (two) times daily. , Disp: , Rfl:  .  Calcium Carb-Cholecalciferol (CALCIUM 600+D3 PO), Take 1 tablet by mouth 2 (two) times  daily. , Disp: , Rfl:  .  cetirizine (ZYRTEC) 10 MG tablet, Take 10 mg by mouth daily. , Disp: , Rfl:  .  Cholecalciferol (VITAMIN D-3) 5000 units TABS, Take 5,000 Units by mouth 2 (two) times daily. , Disp: , Rfl:  .  gabapentin (NEURONTIN) 300 MG capsule, Take 1 capsule (300 mg total) by mouth at bedtime. (Patient taking differently: Take 300 mg by mouth at bedtime. ), Disp: 90 capsule, Rfl: 4 .  ibuprofen (ADVIL,MOTRIN) 200 MG tablet, Take 800 mg by mouth every 8 (eight) hours as needed for mild pain (for pain.). , Disp: , Rfl:  .  Investigational ribociclib (KISQALI) 200 MG tablet [name], Take 2 tablets (400 mg total) by mouth daily. then 7 days off., Disp: 75 tablet, Rfl: 0 .  letrozole (FEMARA) 2.5 MG tablet, TAKE 1 TABLET BY MOUTH DAILY., Disp: 30 tablet, Rfl: 5 .  loratadine (CLARITIN) 10 MG tablet, Take 1 tablet (10 mg total) by mouth daily. (Patient taking differently: Take 10 mg by mouth daily as needed for allergies. ), Disp: 90 tablet, Rfl: 2 .  Multiple Vitamin (MULTIVITAMIN WITH MINERALS) TABS tablet, Take 1 tablet by mouth daily. , Disp: , Rfl:  .  Multiple Vitamins-Minerals (EMERGEN-C IMMUNE PO), Take by mouth as needed., Disp: , Rfl:  .  Multiple Vitamins-Minerals (ZINC PO), Take 220 tablets by mouth 2 (two) times daily. , Disp: , Rfl:  .  Omega-3 Fatty Acids (FISH OIL) 1000 MG CAPS, Take 1,000 mg by mouth 2 (two) times daily. , Disp: , Rfl:  .  phenylephrine (SUDAFED PE) 10 MG TABS tablet, Take 10 mg by mouth every 4 (four) hours as needed., Disp: , Rfl:  .  Probiotic Product (PROBIOTIC DAILY PO), Take by mouth 2 (two) times daily. , Disp: , Rfl:  .  senna (SENOKOT) 8.6 MG TABS tablet, Take 1 tablet (8.6 mg total) by mouth 2 (two) times daily. (Patient taking differently: Take 1 tablet by mouth 2 (two) times daily. Patient takes prn), Disp: 120 each, Rfl: 0 .  Turmeric 450 MG CAPS, Take 450 mg by mouth 2 (two) times daily., Disp: , Rfl:  No current facility-administered  medications for this visit.   Facility-Administered Medications Ordered in Other Visits:  .  sodium chloride flush (NS) 0.9 % injection 10 mL, 10 mL, Intravenous, PRN, Magrinat, [name], MD, 10 mL at 03/10/17 1239   Objective:   Vitals:   10/15/18 1112  BP: (!) 151/87  Pulse: 85  Temp: 98.5 F (36.9 C)  SpO2: 99%    Physical Exam Vitals signs and nursing note reviewed.  Constitutional:      Appearance: Normal appearance.  HENT:     Head: Normocephalic and atraumatic.     Nose: Nose normal.     Mouth/Throat:     Mouth: Mucous membranes are moist.  Eyes:     Extraocular Movements: Extraocular movements intact.  Cardiovascular:  Rate and Rhythm: Normal rate.     Pulses: Normal pulses.  Pulmonary:     Effort: Pulmonary effort is normal.  Abdominal:     General: Abdomen is flat. There is no distension.     Tenderness: There is no abdominal tenderness.  Skin:    General: Skin is warm.  Neurological:     General: No focal deficit present.     Mental Status: She is alert and oriented to person, place, and time.  Psychiatric:        Mood and Affect: Mood normal.        Behavior: Behavior normal.     Assessment & Plan:  Malignant neoplasm of upper-inner quadrant of left breast in female, estrogen receptor positive ([place])  Acquired absence of left breast  We had a long discussion about the risks and complications of the surgery. The risk that can be encountered for this procedure were discussed and include the following but not limited to these: asymmetry, fluid accumulation, firmness of the tissue, skin loss, decrease or no sensation, fat necrosis, bleeding, infection, healing delay.  Deep vein thrombosis, cardiac and pulmonary complications are risks to any procedure.  There are risks of anesthesia, changes to skin sensation and injury to nerves or blood vessels.  The muscle can be temporarily or permanently injured.  You may have an allergic reaction to tape, suture,  glue, blood products which can result in skin discoloration, swelling, pain, skin lesions, poor healing.  Any of these can lead to the need for revisonal surgery or stage procedures.  Weight gain and weigh loss can also effect the long term appearance. The results are not guaranteed to last a lifetime.  Future surgery may be required.    She is now thinking that a latissimus may be better due to the decrease in risks that would slow her down and affect her return to work and ability to care for her grandchild.   [place], DO

## 2018-10-18 ENCOUNTER — Other Ambulatory Visit: Payer: Self-pay

## 2018-10-18 ENCOUNTER — Inpatient Hospital Stay: Payer: 59

## 2018-10-18 ENCOUNTER — Inpatient Hospital Stay (HOSPITAL_BASED_OUTPATIENT_CLINIC_OR_DEPARTMENT_OTHER): Payer: 59 | Admitting: Oncology

## 2018-10-18 VITALS — BP 124/62 | HR 89 | Temp 98.5°F | Resp 18 | Ht 66.0 in | Wt 258.1 lb

## 2018-10-18 DIAGNOSIS — Z9221 Personal history of antineoplastic chemotherapy: Secondary | ICD-10-CM | POA: Diagnosis not present

## 2018-10-18 DIAGNOSIS — Z170 Estrogen receptor positive status [ER+]: Secondary | ICD-10-CM

## 2018-10-18 DIAGNOSIS — Z79811 Long term (current) use of aromatase inhibitors: Secondary | ICD-10-CM

## 2018-10-18 DIAGNOSIS — Z006 Encounter for examination for normal comparison and control in clinical research program: Secondary | ICD-10-CM

## 2018-10-18 DIAGNOSIS — Z9012 Acquired absence of left breast and nipple: Secondary | ICD-10-CM

## 2018-10-18 DIAGNOSIS — R232 Flushing: Secondary | ICD-10-CM

## 2018-10-18 DIAGNOSIS — C50812 Malignant neoplasm of overlapping sites of left female breast: Secondary | ICD-10-CM | POA: Diagnosis not present

## 2018-10-18 DIAGNOSIS — Z79899 Other long term (current) drug therapy: Secondary | ICD-10-CM | POA: Diagnosis not present

## 2018-10-18 DIAGNOSIS — Z5111 Encounter for antineoplastic chemotherapy: Secondary | ICD-10-CM | POA: Diagnosis not present

## 2018-10-18 DIAGNOSIS — C50212 Malignant neoplasm of upper-inner quadrant of left female breast: Secondary | ICD-10-CM

## 2018-10-18 DIAGNOSIS — C50912 Malignant neoplasm of unspecified site of left female breast: Secondary | ICD-10-CM

## 2018-10-18 DIAGNOSIS — Z923 Personal history of irradiation: Secondary | ICD-10-CM

## 2018-10-18 MED ORDER — LETROZOLE 2.5 MG PO TABS: 2.5000 mg | ORAL_TABLET | Freq: Every day | ORAL | 5 refills | Status: DC

## 2018-10-18 MED ORDER — PROCHLORPERAZINE MALEATE 10 MG PO TABS: 10.0000 mg | ORAL_TABLET | Freq: Four times a day (QID) | ORAL | 0 refills | Status: DC | PRN

## 2018-10-18 MED ORDER — PROMETHAZINE HCL 12.5 MG PO TABS: 12.5000 mg | ORAL_TABLET | Freq: Three times a day (TID) | ORAL | 0 refills | Status: DC | PRN

## 2018-10-18 MED ORDER — GABAPENTIN 300 MG PO CAPS: 300.0000 mg | ORAL_CAPSULE | Freq: Every day | ORAL | 4 refills | Status: DC

## 2018-10-18 MED ORDER — GOSERELIN ACETATE 3.6 MG ~~LOC~~ IMPL
DRUG_IMPLANT | SUBCUTANEOUS | Status: AC
  Filled 2018-10-18: qty 3.6

## 2018-10-18 MED ORDER — GOSERELIN ACETATE 3.6 MG ~~LOC~~ IMPL
3.6000 mg | DRUG_IMPLANT | Freq: Once | SUBCUTANEOUS | Status: AC
  Administered 2018-10-18: 3.6 mg via SUBCUTANEOUS

## 2018-10-18 MED FILL — PROMETHAZINE 12.5 MG TABLET: 12.5 | 5 days supply | Qty: 15 | Fill #0

## 2018-10-18 NOTE — Progress Notes (Signed)
Assessment for C6D1 of [name]: Patient arrived unaccompanied into the clinic today to complete study activities.  Labs: Fasting labs were completed on 10/15/18. All results fall within study parameters to continue treatment without dose adjustment. Dr. Jana Hakim reviewed labs and agrees they are adequate to start Cycle 6 of Ribociclib.   VS: Completed per protocol. BP was taken after patient rested for 5 minutes and weight obtained without shoes or heavy clothing.  Con Meds: Patient denies any medication changes since reviewing them with research RN, Foye Spurling, on 10/15/18.   History & Physical: Completed by Dr. Jana Hakim. See provider notes.  AEs: Reviewed with patient by research RN, Foye Spurling, on 10/15/18. Patient denies any changes to AE assessment since 10/15/18.   Pre-dose ECG: Study ECG done at 10:11am shows QTcF of 448m. Dr. MJana Hakimreviewed and states it is ok to start Ribociclib Cycle 6 today as planned.   Study Drug: Patient returned completed medication diary for Cycle 5 which shows no missed doses of Ribociclib and Letrozole. Patient returned bottle of Ribociclib kit #[id number] 33 pills in bottle, which is the expected number of pills remaining for patient not missing any doses this cycle. IRT assigned kit ##[id number] Cycle 6 of Ribociclib. Dispensed by MDemetrius Charity Pharmacist. Double checked patient's name, DOB, Study ID#, Kit #, expiration date and dose. Ribociclib given to patient along with medication diary for cycle 6 Ribociclib and Letrozole. Patient took dose of Ribociclib and Letrozole in clinic today at 10:53am and documented it in her medication diary. Per Dr. MJana Hakim patient is to stop taking Ribociclib starting 10/26/18 in preparation for surgery on 11/03/18. Please see Dr. MVirgie Dadencounter for further details.  Goserelin injection given by injection nurse today.  Thanked patient for her participation today. Instructed patient to call for any  questions or concerns prior to next visit. She verbalized understanding.  JJohney MaineRN, BSN Clinical Research  10/18/18 11:28 AM

## 2018-10-18 NOTE — Patient Instructions (Signed)
Goserelin injection What is this medicine? GOSERELIN (GOE se rel in) is similar to a hormone found in the body. It lowers the amount of sex hormones that the body makes. Men will have lower testosterone levels and women will have lower estrogen levels while taking this medicine. In men, this medicine is used to treat prostate cancer; the injection is either given once per month or once every 12 weeks. A once per month injection (only) is used to treat women with endometriosis, dysfunctional uterine bleeding, or advanced breast cancer. This medicine may be used for other purposes; ask your health care provider or pharmacist if you have questions. COMMON BRAND NAME(S): Zoladex What should I tell my health care provider before I take this medicine? They need to know if you have any of these conditions (some only apply to women): -diabetes -heart disease or previous heart attack -high blood pressure -high cholesterol -kidney disease -osteoporosis or low bone density -problems passing urine -spinal cord injury -stroke -tobacco smoker -an unusual or allergic reaction to goserelin, hormone therapy, other medicines, foods, dyes, or preservatives -pregnant or trying to get pregnant -breast-feeding How should I use this medicine? This medicine is for injection under the skin. It is given by a health care professional in a hospital or clinic setting. Men receive this injection once every 4 weeks or once every 12 weeks. Women will only receive the once every 4 weeks injection. Talk to your pediatrician regarding the use of this medicine in children. Special care may be needed. Overdosage: If you think you have taken too much of this medicine contact a poison control center or emergency room at once. NOTE: This medicine is only for you. Do not share this medicine with others. What if I miss a dose? It is important not to miss your dose. Call your doctor or health care professional if you are unable to  keep an appointment. What may interact with this medicine? -female hormones like estrogen -herbal or dietary supplements like black cohosh, chasteberry, or DHEA -female hormones like testosterone -prasterone This list may not describe all possible interactions. Give your health care provider a list of all the medicines, herbs, non-prescription drugs, or dietary supplements you use. Also tell them if you smoke, drink alcohol, or use illegal drugs. Some items may interact with your medicine. What should I watch for while using this medicine? Visit your doctor or health care professional for regular checks on your progress. Your symptoms may appear to get worse during the first weeks of this therapy. Tell your doctor or healthcare professional if your symptoms do not start to get better or if they get worse after this time. Your bones may get weaker if you take this medicine for a long time. If you smoke or frequently drink alcohol you may increase your risk of bone loss. A family history of osteoporosis, chronic use of drugs for seizures (convulsions), or corticosteroids can also increase your risk of bone loss. Talk to your doctor about how to keep your bones strong. This medicine should stop regular monthly menstration in women. Tell your doctor if you continue to menstrate. Women should not become pregnant while taking this medicine or for 12 weeks after stopping this medicine. Women should inform their doctor if they wish to become pregnant or think they might be pregnant. There is a potential for serious side effects to an unborn child. Talk to your health care professional or pharmacist for more information. Do not breast-feed an infant while taking   this medicine. Men should inform their doctors if they wish to father a child. This medicine may lower sperm counts. Talk to your health care professional or pharmacist for more information. What side effects may I notice from receiving this  medicine? Side effects that you should report to your doctor or health care professional as soon as possible: -allergic reactions like skin rash, itching or hives, swelling of the face, lips, or tongue -bone pain -breathing problems -changes in vision -chest pain -feeling faint or lightheaded, falls -fever, chills -pain, swelling, warmth in the leg -pain, tingling, numbness in the hands or feet -signs and symptoms of low blood pressure like dizziness; feeling faint or lightheaded, falls; unusually weak or tired -stomach pain -swelling of the ankles, feet, hands -trouble passing urine or change in the amount of urine -unusually high or low blood pressure -unusually weak or tired Side effects that usually do not require medical attention (report to your doctor or health care professional if they continue or are bothersome): -change in sex drive or performance -changes in breast size in both males and females -changes in emotions or moods -headache -hot flashes -irritation at site where injected -loss of appetite -skin problems like acne, dry skin -vaginal dryness This list may not describe all possible side effects. Call your doctor for medical advice about side effects. You may report side effects to FDA at 1-800-FDA-1088. Where should I keep my medicine? This drug is given in a hospital or clinic and will not be stored at home. NOTE: This sheet is a summary. It may not cover all possible information. If you have questions about this medicine, talk to your doctor, pharmacist, or health care provider.  2019 Elsevier/Gold Standard (2013-08-02 11:10:35)  

## 2018-10-18 NOTE — Addendum Note (Signed)
Addended by: Wallace Going on: 10/18/2018 01:15 PM   Modules accepted: Orders

## 2018-10-19 ENCOUNTER — Other Ambulatory Visit: Payer: Self-pay | Admitting: *Deleted

## 2018-10-19 ENCOUNTER — Telehealth: Payer: Self-pay | Admitting: Oncology

## 2018-10-19 ENCOUNTER — Other Ambulatory Visit: Payer: Self-pay | Admitting: Oncology

## 2018-10-19 DIAGNOSIS — C50212 Malignant neoplasm of upper-inner quadrant of left female breast: Secondary | ICD-10-CM

## 2018-10-19 DIAGNOSIS — C50812 Malignant neoplasm of overlapping sites of left female breast: Secondary | ICD-10-CM

## 2018-10-19 DIAGNOSIS — Z170 Estrogen receptor positive status [ER+]: Secondary | ICD-10-CM

## 2018-10-19 DIAGNOSIS — C50912 Malignant neoplasm of unspecified site of left female breast: Secondary | ICD-10-CM

## 2018-10-19 MED ORDER — INV-RIBOCICLIB 200 MG TAB NATALEE TRIO003 STUDY: 400.0000 mg | ORAL_TABLET | Freq: Every day | ORAL | 0 refills | Status: DC

## 2018-10-19 NOTE — Telephone Encounter (Signed)
Tried to reach regarding schedule °

## 2018-10-20 ENCOUNTER — Telehealth: Payer: Self-pay | Admitting: *Deleted

## 2018-10-20 NOTE — Telephone Encounter (Signed)
Patient LVM regarding her next lab appointment is scheduled for Thursday 6/4 at 11 am.  It should be on Friday 6/5 at 9 am.  Called patient back and left her a VM informing her I sent scheduling message yesterday to change it to 6/5.  It should be changed soon.  Also reminded patient she will need a lab prior to surgery.  I am waiting to see if and when she might have pre-op labs scheduled before surgery.  If the surgeon does not schedule this then we will schedule patient to come into our Lab the day before her surgery to make sure her blood counts are ok for surgery per study protocol.  Asked patient to call back if any questions, otherwise will follow up with her next week.  Foye Spurling, BSN, RN Clinical Research Nurse 10/20/2018 1:04 PM

## 2018-10-24 ENCOUNTER — Other Ambulatory Visit: Payer: Self-pay | Admitting: *Deleted

## 2018-10-26 ENCOUNTER — Telehealth: Payer: Self-pay | Admitting: Plastic Surgery

## 2018-10-26 ENCOUNTER — Telehealth: Payer: Self-pay | Admitting: *Deleted

## 2018-10-26 NOTE — Telephone Encounter (Signed)
Patient called asking what labs will be drawn pre-operatively. Patient requests that a CBC be drawn due to a medication that she is currently taking. Patient requesting call back from clinical staff for clarification.

## 2018-10-26 NOTE — Telephone Encounter (Signed)
Called patient to confirm she is holding Ribociclib prior to surgery as directed by Dr. Jana Hakim.  Patient confirms she took last dose of this cycle yesterday and holding starting today as directed.  Patient has Pre Op testing this Friday on 5/22 with lab.  Informed patient CBC needed to check her blood counts prior to surgery.  This is usually standard but asked patient to check that it is part of her pre-op labs. Patient verbalized understanding and will check with her surgeon's office. Asked patient to call if any problems.  We can have her come into [facility] for labs prior to surgery if needed.  Foye Spurling, BSN, RN Clinical Research Nurse 10/26/2018 2:36 PM

## 2018-10-27 NOTE — Telephone Encounter (Signed)
Called and [facility] @ 3:27pm @ [phone]) informing the patient that I informed Dr. Marla Roe of her request and she stated that she will place the order for the CBC to be drawn at her pre-op visit.//AB/CMA

## 2018-10-27 NOTE — Telephone Encounter (Signed)
Called and [facility] @ 9:15am @ [phone]) asking the patient to RTC regarding the message below.//AB/CMA

## 2018-10-28 NOTE — Pre-Procedure Instructions (Signed)
[place]  10/28/2018      [place], [place] - [address] [facility]. [address] [place] [address] Phone: [phone] Fax: [place], [place] - [place] [address] Phone: [phone] Fax: [phone]    Your procedure is scheduled on Wed., Nov 03, 2018 from 7:30AM-11:00AM  Report to [facility] Entrance "A" at 5:30AM  Call this number if you have problems the morning of surgery:  [phone]   Remember:  Do not eat or drink after midnight on May 26th    Take these medicines the morning of surgery with A SIP OF WATER; Cetirizine (ZYRTEC) and Letrozole [facility])  If needed: Acetaminophen (TYLENOL), Diazepam (VALIUM), and Prochlorperazine (COMPAZINE)  As of today, stop taking all Other Aspirin Products, Vitamins, Fish oils, and Herbal medications. Also stop all NSAIDS i.e. Advil, Ibuprofen, Motrin, Aleve, Anaprox, Naproxen, BC, Goody Powders, and all Supplements.  How to Manage Your Diabetes Before and After Surgery  Why is it important to control my blood sugar before and after surgery? . Improving blood sugar levels before and after surgery helps healing and can limit problems. . A way of improving blood sugar control is eating a healthy diet by: o  Eating less sugar and carbohydrates o  Increasing activity/exercise o  Talking with your doctor about reaching your blood sugar goals . High blood sugars (greater than 180 mg/dL) can raise your risk of infections and slow your recovery, so you will need to focus on controlling your diabetes during the weeks before surgery. . Make sure that the doctor who takes care of your diabetes knows about your planned surgery including the date and location.  How do I manage my blood sugar before surgery? . Check your blood sugar at least 4 times a day, starting 2 days before surgery, to  make sure that the level is not too high or low. o Check your blood sugar the morning of your surgery when you wake up and every 2 hours until you get to the Short Stay unit. . If your blood sugar is less than 70 mg/dL, you will need to treat for low blood sugar: o Do not take insulin. o Treat a low blood sugar (less than 70 mg/dL) with  cup of clear juice (cranberry or apple), 4 glucose tablets, OR glucose gel. Recheck blood sugar in 15 minutes after treatment (to make sure it is greater than 70 mg/dL). If your blood sugar is not greater than 70 mg/dL on recheck, call [phone] o  for further instructions. . Report your blood sugar to the short stay nurse when you get to Short Stay.  . If you are admitted to the hospital after surgery: o Your blood sugar will be checked by the staff and you will probably be given insulin after surgery (instead of oral diabetes medicines) to make sure you have good blood sugar levels. o The goal for blood sugar control after surgery is 80-180 mg/dL  WHAT DO I DO ABOUT MY DIABETES MEDICATION?  Marland Kitchen Do not take oral diabetes medicines (pills) the morning of surgery.  . THE NIGHT BEFORE SURGERY, take ___________ units of ___________insulin.     . THE MORNING OF SURGERY, take _____________ units of __________insulin.  . The day of surgery, do not take other diabetes injectables, including Byetta (exenatide), Bydureon (exenatide ER), Victoza (liraglutide), or Trulicity (dulaglutide).  . If  your CBG is greater than 220 mg/dL, you may take  of your sliding scale (correction) dose of insulin.  Reviewed and Endorsed by [facility] Patient Education Committee, August 2015     Do not wear jewelry, make-up or nail polish.  Do not wear lotions, powders, or perfumes, or deodorant.  Do not shave 48 hours prior to surgery.    Do not bring valuables to the hospital.  [facility] is not responsible for any belongings or valuables.  Contacts, dentures or bridgework  may not be worn into surgery.  Leave your suitcase in the car.  After surgery it may be brought to your room.  For patients admitted to the hospital, discharge time will be determined by your treatment team.  Patients discharged the day of surgery will not be allowed to drive home.   Special instructions:   [place]- Preparing For Surgery  Before surgery, you can play an important role. Because skin is not sterile, your skin needs to be as free of germs as possible. You can reduce the number of germs on your skin by washing with CHG (chlorahexidine gluconate) Soap before surgery.  CHG is an antiseptic cleaner which kills germs and bonds with the skin to continue killing germs even after washing.    Oral Hygiene is also important to reduce your risk of infection.  Remember - BRUSH YOUR TEETH THE MORNING OF SURGERY WITH YOUR REGULAR TOOTHPASTE  Please do not use if you have an allergy to CHG or antibacterial soaps. If your skin becomes reddened/irritated stop using the CHG.  Do not shave (including legs and underarms) for at least 48 hours prior to first CHG shower. It is OK to shave your face.  Please follow these instructions carefully.   1. Shower the NIGHT BEFORE SURGERY and the MORNING OF SURGERY with CHG.   2. If you chose to wash your hair, wash your hair first as usual with your normal shampoo.  3. After you shampoo, rinse your hair and body thoroughly to remove the shampoo.  4. Use CHG as you would any other liquid soap. You can apply CHG directly to the skin and wash gently with a scrungie or a clean washcloth.   5. Apply the CHG Soap to your body ONLY FROM THE NECK DOWN.  Do not use on open wounds or open sores. Avoid contact with your eyes, ears, mouth and genitals (private parts). Wash Face and genitals (private parts)  with your normal soap.  6. Wash thoroughly, paying special attention to the area where your surgery will be performed.  7. Thoroughly rinse your body with  warm water from the neck down.  8. DO NOT shower/wash with your normal soap after using and rinsing off the CHG Soap.  9. Pat yourself dry with a CLEAN TOWEL.  10. Wear CLEAN PAJAMAS to bed the night before surgery, wear comfortable clothes the morning of surgery  11. Place CLEAN SHEETS on your bed the night of your first shower and DO NOT SLEEP WITH PETS.  Day of Surgery:  Do not apply any deodorants/lotions.  Please wear clean clothes to the hospital/surgery center.   Remember to brush your teeth WITH YOUR REGULAR TOOTHPASTE.  Please read over the following fact sheets that you were given. Pain Booklet, Coughing and Deep Breathing and Surgical Site Infection Prevention

## 2018-10-29 ENCOUNTER — Other Ambulatory Visit (HOSPITAL_COMMUNITY)
Admission: RE | Admit: 2018-10-29 | Discharge: 2018-10-29 | Disposition: A | Discharge status: Home / Self Care | Payer: 59 | Source: Ambulatory Visit | Attending: Plastic Surgery | Admitting: Plastic Surgery

## 2018-10-29 ENCOUNTER — Other Ambulatory Visit: Payer: Self-pay

## 2018-10-29 ENCOUNTER — Encounter (HOSPITAL_COMMUNITY): Payer: Self-pay

## 2018-10-29 ENCOUNTER — Encounter (HOSPITAL_COMMUNITY)
Admission: RE | Admit: 2018-10-29 | Discharge: 2018-10-29 | Disposition: A | Discharge status: Home / Self Care | Payer: 59 | Source: Ambulatory Visit | Attending: Plastic Surgery | Admitting: Plastic Surgery

## 2018-10-29 DIAGNOSIS — E785 Hyperlipidemia, unspecified: Secondary | ICD-10-CM | POA: Insufficient documentation

## 2018-10-29 DIAGNOSIS — I10 Essential (primary) hypertension: Secondary | ICD-10-CM | POA: Diagnosis not present

## 2018-10-29 DIAGNOSIS — Z853 Personal history of malignant neoplasm of breast: Secondary | ICD-10-CM | POA: Diagnosis not present

## 2018-10-29 DIAGNOSIS — C50812 Malignant neoplasm of overlapping sites of left female breast: Secondary | ICD-10-CM | POA: Insufficient documentation

## 2018-10-29 DIAGNOSIS — Z79899 Other long term (current) drug therapy: Secondary | ICD-10-CM | POA: Insufficient documentation

## 2018-10-29 DIAGNOSIS — Z170 Estrogen receptor positive status [ER+]: Secondary | ICD-10-CM | POA: Diagnosis not present

## 2018-10-29 DIAGNOSIS — Z1159 Encounter for screening for other viral diseases: Secondary | ICD-10-CM | POA: Diagnosis not present

## 2018-10-29 DIAGNOSIS — Z01812 Encounter for preprocedural laboratory examination: Secondary | ICD-10-CM | POA: Insufficient documentation

## 2018-10-29 DIAGNOSIS — E559 Vitamin D deficiency, unspecified: Secondary | ICD-10-CM | POA: Diagnosis not present

## 2018-10-29 DIAGNOSIS — E119 Type 2 diabetes mellitus without complications: Secondary | ICD-10-CM | POA: Diagnosis not present

## 2018-10-29 DIAGNOSIS — Z6841 Body Mass Index (BMI) 40.0 and over, adult: Secondary | ICD-10-CM | POA: Diagnosis not present

## 2018-10-29 LAB — CBC WITH DIFFERENTIAL/PLATELET
Abs Immature Granulocytes: 0.01 10*3/uL (ref 0.00–0.07)
Basophils Absolute: 0 10*3/uL (ref 0.0–0.1)
Basophils Relative: 1 %
Eosinophils Absolute: 0 10*3/uL (ref 0.0–0.5)
Eosinophils Relative: 1 %
HCT: 37.5 % (ref 36.0–46.0)
Hemoglobin: 12 g/dL (ref 12.0–15.0)
Immature Granulocytes: 0 %
Lymphocytes Relative: 22 %
Lymphs Abs: 0.9 10*3/uL (ref 0.7–4.0)
MCH: 28.4 pg (ref 26.0–34.0)
MCHC: 32 g/dL (ref 30.0–36.0)
MCV: 88.9 fL (ref 80.0–100.0)
Monocytes Absolute: 0.4 10*3/uL (ref 0.1–1.0)
Monocytes Relative: 9 %
Neutro Abs: 3 10*3/uL (ref 1.7–7.7)
Neutrophils Relative %: 67 %
Platelets: 337 10*3/uL (ref 150–400)
RBC: 4.22 MIL/uL (ref 3.87–5.11)
RDW: 14.1 % (ref 11.5–15.5)
WBC: 4.3 10*3/uL (ref 4.0–10.5)
nRBC: 0 % (ref 0.0–0.2)

## 2018-10-29 LAB — BASIC METABOLIC PANEL
Anion gap: 9 (ref 5–15)
BUN: 15 mg/dL (ref 6–20)
CO2: 25 mmol/L (ref 22–32)
Calcium: 9.8 mg/dL (ref 8.9–10.3)
Chloride: 103 mmol/L (ref 98–111)
Creatinine, Ser: 0.97 mg/dL (ref 0.44–1.00)
GFR calc Af Amer: >60 mL/min (ref >60)
GFR calc non Af Amer: >60 mL/min (ref >60)
Glucose, Bld: 115 mg/dL — ABNORMAL HIGH (ref 70–99)
Potassium: 4.1 mmol/L (ref 3.5–5.1)
Sodium: 137 mmol/L (ref 135–145)

## 2018-10-29 MED FILL — HYDROCODON-APAP 5-325: 5-325 | 8 days supply | Qty: 30 | Fill #0

## 2018-10-29 MED FILL — CEPHALEXIN 500 MG CAPSULE: 500 | 5 days supply | Qty: 20 | Fill #0

## 2018-10-29 MED FILL — diazePAM 2 MG TABS: 2 | 10 days supply | Qty: 30 | Fill #0

## 2018-10-29 NOTE — Anesthesia Preprocedure Evaluation (Addendum)
Anesthesia Evaluation  Patient identified by MRN, date of birth, ID band Patient awake    Reviewed: Allergy & Precautions, NPO status , Patient's Chart, lab work & pertinent test results  Airway Mallampati: III  TM Distance: <3 FB Neck ROM: Full    Dental no notable dental hx. (+) Teeth Intact, Chipped, Dental Advisory Given   Pulmonary neg pulmonary ROS,    breath sounds clear to auscultation + decreased breath sounds      Cardiovascular hypertension, Normal cardiovascular exam Rhythm:Regular Rate:Normal     Neuro/Psych negative neurological ROS  negative psych ROS   GI/Hepatic negative GI ROS, Neg liver ROS,   Endo/Other  diabetesMorbid obesity  Renal/GU negative Renal ROS  negative genitourinary   Musculoskeletal negative musculoskeletal ROS (+)   Abdominal   Peds negative pediatric ROS (+)  Hematology  (+) Blood dyscrasia, anemia ,   Anesthesia Other Findings   Reproductive/Obstetrics negative OB ROS                          Anesthesia Physical Anesthesia Plan  ASA: III  Anesthesia Plan: General   Post-op Pain Management:    Induction: Intravenous and Rapid sequence  PONV Risk Score and Plan: 4 or greater and Ondansetron, Dexamethasone, Treatment may vary due to age or medical condition, Scopolamine patch - Pre-op and Midazolam  Airway Management Planned: Oral ETT  Additional Equipment:   Intra-op Plan:   Post-operative Plan: Extubation in OR  Informed Consent: I have reviewed the patients History and Physical, chart, labs and discussed the procedure including the risks, benefits and alternatives for the proposed anesthesia with the patient or authorized representative who has indicated his/her understanding and acceptance.     Dental advisory given  Plan Discussed with: CRNA and Surgeon  Anesthesia Plan Comments: (PAT note written 10/29/2018 by Myra Gianotti, PA-C. )        Anesthesia Quick Evaluation

## 2018-10-29 NOTE — Progress Notes (Signed)
Anesthesia PAT Evaluation:  Case:  [id number] Date/Time:  11/03/18 0715   Procedures:      LATISSIMUS FLAP TO BREAST (Left )     PLACEMENT OF TISSUE EXPANDER (Left Breast) - Total case time is 3.5 hours   Anesthesia type:  General   Pre-op diagnosis:  Malignant Neoplasm Of Overlapping Sites Of Left Breast In Female, Estrogen Receptor Positive, Acquired absence of left breast   Location:  [facility] OR ROOM 08 / [facility]   Surgeon:  Wallace Going, DO      DISCUSSION: Patient is a 48 year old female scheduled for the above procedure.  History includes never smoker, left breast cancer (diagnosed 01/14/17, started chemotherapy 02/10/17, s/p left modified radical mastectomy with placement of tissue expander 08/27/17, post-mastectomy radiation completed 01/04/18),Port-a-cath 01/28/17, gestational diabetes/pre-diabetes, HLD, HTN (denied), non-celiac gluten sensitivity, anemia. BMI is consistent with morbid obesity.  She gets periodic EKGs due to ribociclib, and was referred to cardiologist Fransico Him, MD in March 2020 for reportedly an abnormal EKG for research study (tracing not in [facility], but by notes showed, "sinus rhythm with sinus arrhythmia possible inferior MI probably old with posterior extension." EKG was repeated and showed NSR without evidence of infarct. Patient was asymptomatic. No ischemic testing ordered, but Dr. Radford Pax had recommended a repeat echo with Definity contrast because her 05/2018 echocardiogram during chemotherapy treatments showed poor acoustical windows limiting the accurate assessment of EF or wall motion, although overall LVEF felt to be normal or near normal. Echo was scheduled, but ultimately postponed for at least 12 weeks by cardiology due to the COVID-19 pandemic.   Now that she has upcoming elective surgery, anesthesiologist Roberts Gaudy, MD recommended touching base with Dr. Radford Pax to determine if any different recommendations regarding repeat echo timing. I have communicated with  Dr. Radford Pax who responded, "As long as she has no cardiac symptoms and can do ADLs and walk up 1-2 flights of stairs or 4 blocks without any problems then no further cardiac workup needed preoperatively." I evaluated Mackenzie [name] during her PAT visit. 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 She continues to work 6 hour shifts twice a week while undergoing therapy for breast cancer. She denied SOB, chest pain, orthopnea. She reported minimal BLE edema after working. She is able to go up 1-2 flights of stairs and when the weather is nice she has been walking 2-3 miles without CV symptoms. She denied any changes in symptoms or energy level since her appointment with Dr. Radford Pax.  Current treatment: letrozole, gosereline, and in [name] trial (ribociclib arm). Last gosereline acetate on 10/18/18. Last dose Ribociclib 10/25/18. Dr. Marjie Skiff is aware of planned surgery and surgery date and is holding her ribociclib until after her surgery.   Preoperative labs acceptable--CBC WNL. She is getting a preoperative COVID 19 test on 10/29/18.  She has good exercise tolerance, METS > 4 without CV/HF symptoms. If no acute changes then I would anticipate that she can proceed as planned.   VS: BP 130/78   Pulse 88   Temp 36.7 C   Resp 18   Ht 5\' 6"  (1.676 m)   Wt 118.4 kg   LMP 11/28/2016   SpO2 99%   BMI 42.14 kg/m   PROVIDERS: Willey Blade, MD is PCP Prince Rome, MD is HEM-ONC   LABS: Labs reviewed: Acceptable for surgery. A1c 5.9 on 07/26/18.  (all labs ordered are listed, but only abnormal results are displayed)  Labs Reviewed  BASIC METABOLIC PANEL -  Abnormal; Notable for the following components:      Result Value   Glucose, Bld 115 (*)    All other components within normal limits  CBC WITH DIFFERENTIAL/PLATELET    IMAGES: CXR 06/28/18: IMPRESSION: No active cardiopulmonary disease.   EKG: 09/20/18: Normal sinus rhythm No significant change since last tracing 06/28/18 Confirmed by  Minus Breeding [phone]) on 09/20/2018 9:57:44 PM   CV: Echo 05/11/18: Study Conclusions - Left ventricle: Global LV longitudinal LV strain is abnormal at   -14.2% but poor images on endocardial segments lead to inaccurate   tracking and should not be used. The cavity size was normal.   Systolic function was normal. The estimated ejection fraction was   in the range of 50% to 55%. Images were inadequate for LV wall   motion assessment. - Aortic valve: Valve area (VTI): 2.06 cm^2. Valve area (Vmax):   2.26 cm^2. Valve area (Vmean): 2.16 cm^2. - Pulmonary arteries: Systolic pressure could not be accurately   estimated. - Recommendations: Poor acoustical windows limits accurate   assessment of EF or wall motion. Overall LVF appears normal to   low normal but recommend repeat limited study with definity   contrast since study is to follow LVF during chemotherapy. Recommendations:  Poor acoustical windows limits accurate assessment of EF or wall motion. Overall LVF appears normal to low normal but recommend repeat limited study with definity contrast since study is to follow LVF during chemotherapy. (Comparison echo 01/30/17: LVEF 60-65%, no regional wall motion abnormalities.)   Past Medical History:  Diagnosis Date  . Abnormal glucose 2018  . 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 Allergic rhinitis 2012  . Anemia 01/27/2017   prior to starting chemotherapy  . Breast cancer () 01/14/2017   Left breast  . 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 Diabetes mellitus 2012   GDM  . Hot flashes 03/2017  . Hyperlipidemia 03/20/2016  . Hypertension 2012   denies  . Morbid obesity with body mass index (BMI) of 40.0 to 49.9 ([place]) 09/17/2017  . NCGS (non-celiac gluten sensitivity)   . Pre-diabetes   . Vitamin D deficiency 05/2015    Past Surgical History:  Procedure Laterality Date  . BREAST RECONSTRUCTION WITH PLACEMENT OF TISSUE EXPANDER  AND FLEX HD (ACELLULAR HYDRATED DERMIS) Left 08/27/2017   Procedure: LEFT BREAST RECONSTRUCTION WITH PLACEMENT OF TISSUE EXPANDER AND FLEX HD;  Surgeon: Wallace Going, DO;  Location: [facility];  Service: Plastics;  Laterality: Left;  . CESAREAN SECTION     x2  . MASTECTOMY MODIFIED RADICAL Left 08/27/2017  . MASTECTOMY MODIFIED RADICAL Left 08/27/2017   Procedure: LEFT MODIFIED RADICAL MASTECTOMY;  Surgeon: Erroll Luna, MD;  Location: [facility];  Service: General;  Laterality: Left;  . PORT-A-CATH REMOVAL Right 08/27/2017   Procedure: REMOVAL PORT-A-CATH RIGHT CHEST;  Surgeon: Erroll Luna, MD;  Location: [facility];  Service: General;  Laterality: Right;  . PORTACATH PLACEMENT Right 01/28/2017   Procedure: INSERTION PORT-A-CATH WITH ULTRASOUND;  Surgeon: Erroll Luna, MD;  Location: [facility];  Service: General;  Laterality: Right;  . TUBAL LIGATION Bilateral 01/21/2011    MEDICATIONS: . acetaminophen (TYLENOL) 500 MG tablet  . B Complex Vitamins (VITAMIN B COMPLEX PO)  . budesonide-formoterol (SYMBICORT) 80-4.5 MCG/ACT inhaler  . Calcium Carb-Cholecalciferol (CALCIUM 600+D3 PO)  . cetirizine (ZYRTEC) 10 MG tablet  . Cholecalciferol (VITAMIN D-3) 5000 units TABS  . diazepam (VALIUM) 2 MG tablet  . gabapentin (  NEURONTIN) 300 MG capsule  . HYDROcodone-acetaminophen (NORCO) 5-325 MG tablet  . hydroxypropyl methylcellulose / hypromellose (ISOPTO TEARS / GONIOVISC) 2.5 % ophthalmic solution  . ibuprofen (ADVIL,MOTRIN) 200 MG tablet  . Investigational ribociclib (KISQALI) 200 MG tablet [name]  . letrozole (FEMARA) 2.5 MG tablet  . loratadine (CLARITIN) 10 MG tablet  . Multiple Vitamin (MULTIVITAMIN WITH MINERALS) TABS tablet  . Omega-3 Fatty Acids (FISH OIL) 1000 MG CAPS  . phenylephrine (SUDAFED PE) 10 MG TABS tablet  . Probiotic Product (PROBIOTIC DAILY PO)  . prochlorperazine (COMPAZINE) 10 MG tablet  . promethazine (PHENERGAN) 12.5 MG tablet  . senna (SENOKOT)  8.6 MG TABS tablet  . Turmeric 450 MG CAPS  . ZINC OXIDE PO   No current facility-administered medications for this encounter.    . sodium chloride flush (NS) 0.9 % injection 10 mL    Myra Gianotti, PA-C Surgical Short Stay/Anesthesiology [facility] Phone [phone] [facility] Phone [phone] 10/29/2018 12:15 PM

## 2018-10-29 NOTE — Pre-Procedure Instructions (Signed)
[place]  10/29/2018      [place], [place] - [address] [facility]. [address] [place] [address] Phone: [phone] Fax: [place], [place] - [place] [address] Phone: [phone] Fax: [phone]    Your procedure is scheduled on Wed., Nov 03, 2018 from 7:30AM-11:00AM  Report to [facility] Entrance "A" at 5:30AM  Call this number if you have problems the morning of surgery:  [phone]   Remember:  Do not eat or drink after midnight on May 26th    Take these medicines the morning of surgery with A SIP OF WATER; Cetirizine (ZYRTEC) and Letrozole ([place]), symbicort--bring inhaler, CLARITIN IF NEEDED  If needed: Acetaminophen (TYLENOL)  As of today, stop taking all Other Aspirin Products, Vitamins, Fish oils, and Herbal medications. Also stop all NSAIDS i.e. Advil, Ibuprofen, Motrin, Aleve, Anaprox, Naproxen, BC, Goody Powders, and all Supplements.       Do not wear jewelry, make-up or nail polish.  Do not wear lotions, powders, or perfumes, or deodorant.  Do not shave 48 hours prior to surgery.    Do not bring valuables to the hospital.  [facility] is not responsible for any belongings or valuables.  Contacts, dentures or bridgework may not be worn into surgery.  Leave your suitcase in the car.  After surgery it may be brought to your room.  For patients admitted to the hospital, discharge time will be determined by your treatment team.  Patients discharged the day of surgery will not be allowed to drive home.   Special instructions:   [place]- Preparing For Surgery  Before surgery, you can play an important role. Because skin is not sterile, your skin needs to be as free of germs as possible. You can reduce the number of germs on your skin by washing with CHG (chlorahexidine gluconate) Soap before  surgery.  CHG is an antiseptic cleaner which kills germs and bonds with the skin to continue killing germs even after washing.    Oral Hygiene is also important to reduce your risk of infection.  Remember - BRUSH YOUR TEETH THE MORNING OF SURGERY WITH YOUR REGULAR TOOTHPASTE  Please do not use if you have an allergy to CHG or antibacterial soaps. If your skin becomes reddened/irritated stop using the CHG.  Do not shave (including legs and underarms) for at least 48 hours prior to first CHG shower. It is OK to shave your face.  Please follow these instructions carefully.   1. Shower the NIGHT BEFORE SURGERY and the MORNING OF SURGERY with CHG.   2. If you chose to wash your hair, wash your hair first as usual with your normal shampoo.  3. After you shampoo, rinse your hair and body thoroughly to remove the shampoo.  4. Use CHG as you would any other liquid soap. You can apply CHG directly to the skin and wash gently with a scrungie or a clean washcloth.   5. Apply the CHG Soap to your body ONLY FROM THE NECK DOWN.  Do not use on open wounds or open sores. Avoid contact with your eyes, ears, mouth and genitals (private parts). Wash Face and genitals (private parts)  with your normal soap.  6. Wash thoroughly, paying special attention to the area where your surgery will be performed.  7. Thoroughly rinse your body with warm water from the neck  down.  8. DO NOT shower/wash with your normal soap after using and rinsing off the CHG Soap.  9. Pat yourself dry with a CLEAN TOWEL.  10. Wear CLEAN PAJAMAS to bed the night before surgery, wear comfortable clothes the morning of surgery  11. Place CLEAN SHEETS on your bed the night of your first shower and DO NOT SLEEP WITH PETS.  Day of Surgery:  Do not apply any deodorants/lotions.  Please wear clean clothes to the hospital/surgery center.   Remember to brush your teeth WITH YOUR REGULAR TOOTHPASTE.  Please read over the following fact  sheets that you were given. Pain Booklet, Coughing and Deep Breathing and Surgical Site Infection Prevention

## 2018-10-29 NOTE — Progress Notes (Signed)
PCP - [place]  Chest x-ray - 06/28/18 EKG - 09/20/18 Stress Test - na ECHO - 05-11-18 Cardiac Cath - na  Sleep Study - na CPAP -   Fasting Blood Sugar - na Checks Blood Sugar _____ times a day  Blood Thinner Instructions:na Aspirin Instructions:  Anesthesia review:   Patient denies shortness of breath, fever, cough and chest pain at PAT appointment   Patient verbalized understanding of instructions that were given to them at the PAT appointment. Patient was also instructed that they will need to review over the PAT instructions again at home before surgery.

## 2018-10-30 LAB — NOVEL CORONAVIRUS, NAA (HOSP ORDER, SEND-OUT TO REF LAB; TAT 18-24 HRS): SARS-CoV-2, NAA: NOT DETECTED

## 2018-11-02 MED ORDER — DEXTROSE 5 % IV SOLN
3.0000 g | INTRAVENOUS | Status: AC
  Administered 2018-11-03 (×2): 3 g via INTRAVENOUS
  Filled 2018-11-02: qty 3

## 2018-11-03 ENCOUNTER — Inpatient Hospital Stay (HOSPITAL_COMMUNITY): Payer: 59 | Admitting: Anesthesiology

## 2018-11-03 ENCOUNTER — Other Ambulatory Visit: Payer: Self-pay

## 2018-11-03 ENCOUNTER — Encounter (HOSPITAL_COMMUNITY)
Admission: RE | Disposition: A | Discharge status: Home / Self Care | Payer: Self-pay | Source: Home / Self Care | Attending: Plastic Surgery

## 2018-11-03 ENCOUNTER — Encounter (HOSPITAL_COMMUNITY): Payer: Self-pay

## 2018-11-03 ENCOUNTER — Inpatient Hospital Stay (HOSPITAL_COMMUNITY): Payer: 59 | Admitting: Vascular Surgery

## 2018-11-03 ENCOUNTER — Inpatient Hospital Stay (HOSPITAL_COMMUNITY)
Admission: RE | Admit: 2018-11-03 | Discharge: 2018-11-05 | DRG: 582 | Disposition: A | Discharge status: Home / Self Care | Payer: 59 | Attending: Plastic Surgery | Admitting: Plastic Surgery

## 2018-11-03 DIAGNOSIS — C50812 Malignant neoplasm of overlapping sites of left female breast: Secondary | ICD-10-CM | POA: Diagnosis not present

## 2018-11-03 DIAGNOSIS — Z79811 Long term (current) use of aromatase inhibitors: Secondary | ICD-10-CM | POA: Diagnosis not present

## 2018-11-03 DIAGNOSIS — C50212 Malignant neoplasm of upper-inner quadrant of left female breast: Secondary | ICD-10-CM | POA: Diagnosis present

## 2018-11-03 DIAGNOSIS — Z9012 Acquired absence of left breast and nipple: Secondary | ICD-10-CM | POA: Diagnosis not present

## 2018-11-03 DIAGNOSIS — Z6841 Body Mass Index (BMI) 40.0 and over, adult: Secondary | ICD-10-CM

## 2018-11-03 DIAGNOSIS — Z7951 Long term (current) use of inhaled steroids: Secondary | ICD-10-CM

## 2018-11-03 DIAGNOSIS — I10 Essential (primary) hypertension: Secondary | ICD-10-CM | POA: Diagnosis not present

## 2018-11-03 DIAGNOSIS — Z79899 Other long term (current) drug therapy: Secondary | ICD-10-CM | POA: Diagnosis not present

## 2018-11-03 DIAGNOSIS — J309 Allergic rhinitis, unspecified: Secondary | ICD-10-CM | POA: Diagnosis present

## 2018-11-03 DIAGNOSIS — Z923 Personal history of irradiation: Secondary | ICD-10-CM

## 2018-11-03 DIAGNOSIS — E785 Hyperlipidemia, unspecified: Secondary | ICD-10-CM | POA: Diagnosis present

## 2018-11-03 DIAGNOSIS — Z853 Personal history of malignant neoplasm of breast: Secondary | ICD-10-CM | POA: Diagnosis not present

## 2018-11-03 DIAGNOSIS — Z9221 Personal history of antineoplastic chemotherapy: Secondary | ICD-10-CM | POA: Diagnosis not present

## 2018-11-03 DIAGNOSIS — Z170 Estrogen receptor positive status [ER+]: Secondary | ICD-10-CM | POA: Diagnosis not present

## 2018-11-03 DIAGNOSIS — K219 Gastro-esophageal reflux disease without esophagitis: Secondary | ICD-10-CM | POA: Diagnosis present

## 2018-11-03 DIAGNOSIS — Z9851 Tubal ligation status: Secondary | ICD-10-CM

## 2018-11-03 DIAGNOSIS — C773 Secondary and unspecified malignant neoplasm of axilla and upper limb lymph nodes: Secondary | ICD-10-CM | POA: Diagnosis not present

## 2018-11-03 DIAGNOSIS — Z421 Encounter for breast reconstruction following mastectomy: Principal | ICD-10-CM

## 2018-11-03 HISTORY — PX: TISSUE EXPANDER PLACEMENT: SHX2530

## 2018-11-03 HISTORY — PX: LATISSIMUS FLAP TO BREAST: SHX5357

## 2018-11-03 LAB — GLUCOSE, CAPILLARY: Glucose-Capillary: 125 mg/dL — ABNORMAL HIGH (ref 70–99)

## 2018-11-03 SURGERY — RECONSTRUCTION, BREAST, USING LATISSIMUS DORSI MYOCUTANEOUS FLAP
Anesthesia: General | Site: Breast | Laterality: Left

## 2018-11-03 MED ORDER — 0.9 % SODIUM CHLORIDE (POUR BTL) OPTIME
TOPICAL | Status: DC | PRN
  Administered 2018-11-03 (×2): 1000 mL

## 2018-11-03 MED ORDER — ONDANSETRON HCL 4 MG/2ML IJ SOLN
INTRAMUSCULAR | Status: AC
  Filled 2018-11-03: qty 2

## 2018-11-03 MED ORDER — KCL IN DEXTROSE-NACL 20-5-0.45 MEQ/L-%-% IV SOLN
INTRAVENOUS | Status: DC
  Administered 2018-11-03 (×2): via INTRAVENOUS
  Filled 2018-11-03 (×2): qty 1000

## 2018-11-03 MED ORDER — POLYETHYLENE GLYCOL 3350 17 G PO PACK: 17.0000 g | PACK | Freq: Every day | ORAL | Status: DC | PRN

## 2018-11-03 MED ORDER — ROCURONIUM BROMIDE 50 MG/5ML IV SOSY
PREFILLED_SYRINGE | INTRAVENOUS | Status: DC | PRN
  Administered 2018-11-03: 40 mg via INTRAVENOUS

## 2018-11-03 MED ORDER — SUCCINYLCHOLINE CHLORIDE 200 MG/10ML IV SOSY
PREFILLED_SYRINGE | INTRAVENOUS | Status: AC
  Filled 2018-11-03: qty 10

## 2018-11-03 MED ORDER — HYDROXYZINE HCL 10 MG PO TABS
10.0000 mg | ORAL_TABLET | Freq: Three times a day (TID) | ORAL | Status: DC | PRN
  Administered 2018-11-03 – 2018-11-05 (×3): 10 mg via ORAL
  Filled 2018-11-03 (×6): qty 1

## 2018-11-03 MED ORDER — ACETAMINOPHEN 500 MG PO TABS
1000.0000 mg | ORAL_TABLET | Freq: Once | ORAL | Status: AC
  Administered 2018-11-03: 07:00:00 1000 mg via ORAL
  Filled 2018-11-03: qty 2

## 2018-11-03 MED ORDER — LACTATED RINGERS IV SOLN
INTRAVENOUS | Status: DC | PRN
  Administered 2018-11-03 (×2): via INTRAVENOUS

## 2018-11-03 MED ORDER — SUGAMMADEX SODIUM 200 MG/2ML IV SOLN
INTRAVENOUS | Status: DC | PRN
  Administered 2018-11-03: 236.8 mg via INTRAVENOUS

## 2018-11-03 MED ORDER — FENTANYL CITRATE (PF) 100 MCG/2ML IJ SOLN
INTRAMUSCULAR | Status: AC
  Filled 2018-11-03: qty 2

## 2018-11-03 MED ORDER — SUCCINYLCHOLINE CHLORIDE 200 MG/10ML IV SOSY
PREFILLED_SYRINGE | INTRAVENOUS | Status: DC | PRN
  Administered 2018-11-03: 100 mg via INTRAVENOUS

## 2018-11-03 MED ORDER — ROCURONIUM BROMIDE 10 MG/ML (PF) SYRINGE
PREFILLED_SYRINGE | INTRAVENOUS | Status: AC
  Filled 2018-11-03: qty 10

## 2018-11-03 MED ORDER — EVICEL 5 ML EX KIT
PACK | CUTANEOUS | Status: DC | PRN
  Administered 2018-11-03: 5 mL
  Administered 2018-11-03: 1

## 2018-11-03 MED ORDER — HYDROCODONE-ACETAMINOPHEN 5-325 MG PO TABS
1.0000 | ORAL_TABLET | ORAL | Status: DC | PRN
  Administered 2018-11-03 – 2018-11-04 (×5): 2 via ORAL
  Filled 2018-11-03 (×5): qty 2

## 2018-11-03 MED ORDER — CEFAZOLIN SODIUM 1 G IJ SOLR
INTRAMUSCULAR | Status: AC
  Filled 2018-11-03: qty 30

## 2018-11-03 MED ORDER — NAPROXEN 250 MG PO TABS: 500.0000 mg | ORAL_TABLET | Freq: Two times a day (BID) | ORAL | Status: DC | PRN

## 2018-11-03 MED ORDER — LIDOCAINE 2% (20 MG/ML) 5 ML SYRINGE
INTRAMUSCULAR | Status: DC | PRN
  Administered 2018-11-03: 60 mg via INTRAVENOUS

## 2018-11-03 MED ORDER — HYDROMORPHONE HCL 1 MG/ML IJ SOLN
1.0000 mg | INTRAMUSCULAR | Status: DC | PRN
  Administered 2018-11-03: 1 mg via INTRAVENOUS
  Filled 2018-11-03: qty 1

## 2018-11-03 MED ORDER — MIDAZOLAM HCL 2 MG/2ML IJ SOLN
INTRAMUSCULAR | Status: DC | PRN
  Administered 2018-11-03: 2 mg via INTRAVENOUS

## 2018-11-03 MED ORDER — BUPIVACAINE-EPINEPHRINE (PF) 0.25% -1:200000 IJ SOLN
INTRAMUSCULAR | Status: AC
  Filled 2018-11-03: qty 30

## 2018-11-03 MED ORDER — ALBUMIN HUMAN 5 % IV SOLN
INTRAVENOUS | Status: DC | PRN
  Administered 2018-11-03: 10:00:00 via INTRAVENOUS

## 2018-11-03 MED ORDER — SODIUM CHLORIDE 0.9 % IV SOLN
INTRAVENOUS | Status: DC | PRN
  Administered 2018-11-03: 50 ug/min via INTRAVENOUS

## 2018-11-03 MED ORDER — DEXAMETHASONE SODIUM PHOSPHATE 10 MG/ML IJ SOLN
INTRAMUSCULAR | Status: AC
  Filled 2018-11-03: qty 1

## 2018-11-03 MED ORDER — HYDROMORPHONE HCL 1 MG/ML IJ SOLN
0.2500 mg | INTRAMUSCULAR | Status: DC | PRN
  Administered 2018-11-03 (×2): 0.25 mg via INTRAVENOUS
  Administered 2018-11-03 (×2): 0.5 mg via INTRAVENOUS

## 2018-11-03 MED ORDER — SODIUM CHLORIDE 0.9 % IV SOLN
INTRAVENOUS | Status: AC
  Filled 2018-11-03: qty 500000

## 2018-11-03 MED ORDER — DIPHENHYDRAMINE HCL 50 MG/ML IJ SOLN
INTRAMUSCULAR | Status: DC | PRN
  Administered 2018-11-03: 12.5 mg via INTRAVENOUS

## 2018-11-03 MED ORDER — SODIUM CHLORIDE 0.9 % IV SOLN
INTRAVENOUS | Status: DC | PRN
  Administered 2018-11-03: 08:00:00 500 mL

## 2018-11-03 MED ORDER — SCOPOLAMINE 1 MG/3DAYS TD PT72
MEDICATED_PATCH | TRANSDERMAL | Status: AC
  Filled 2018-11-03: qty 1

## 2018-11-03 MED ORDER — PROPOFOL 10 MG/ML IV BOLUS
INTRAVENOUS | Status: AC
  Filled 2018-11-03: qty 40

## 2018-11-03 MED ORDER — FENTANYL CITRATE (PF) 250 MCG/5ML IJ SOLN
INTRAMUSCULAR | Status: AC
  Filled 2018-11-03: qty 5

## 2018-11-03 MED ORDER — DEXAMETHASONE SODIUM PHOSPHATE 10 MG/ML IJ SOLN
INTRAMUSCULAR | Status: DC | PRN
  Administered 2018-11-03: 5 mg via INTRAVENOUS

## 2018-11-03 MED ORDER — DIPHENHYDRAMINE HCL 50 MG/ML IJ SOLN
INTRAMUSCULAR | Status: AC
  Filled 2018-11-03: qty 1

## 2018-11-03 MED ORDER — CHLORHEXIDINE GLUCONATE CLOTH 2 % EX PADS: 6.0000 | MEDICATED_PAD | Freq: Once | CUTANEOUS | Status: DC

## 2018-11-03 MED ORDER — BUPIVACAINE-EPINEPHRINE 0.25% -1:200000 IJ SOLN
INTRAMUSCULAR | Status: DC | PRN
  Administered 2018-11-03: 30 mL

## 2018-11-03 MED ORDER — MENTHOL 3 MG MT LOZG
1.0000 | LOZENGE | OROMUCOSAL | Status: DC | PRN
  Administered 2018-11-03: 3 mg via ORAL
  Filled 2018-11-03: qty 9

## 2018-11-03 MED ORDER — ONDANSETRON 4 MG PO TBDP: 4.0000 mg | ORAL_TABLET | Freq: Four times a day (QID) | ORAL | Status: DC | PRN

## 2018-11-03 MED ORDER — CEFAZOLIN SODIUM-DEXTROSE 1-4 GM/50ML-% IV SOLN
1.0000 g | Freq: Three times a day (TID) | INTRAVENOUS | Status: DC
  Administered 2018-11-03 – 2018-11-05 (×5): 1 g via INTRAVENOUS
  Filled 2018-11-03 (×7): qty 50

## 2018-11-03 MED ORDER — FENTANYL CITRATE (PF) 250 MCG/5ML IJ SOLN
INTRAMUSCULAR | Status: DC | PRN
  Administered 2018-11-03 (×2): 25 ug via INTRAVENOUS
  Administered 2018-11-03 (×3): 50 ug via INTRAVENOUS
  Administered 2018-11-03: 150 ug via INTRAVENOUS
  Administered 2018-11-03: 50 ug via INTRAVENOUS

## 2018-11-03 MED ORDER — HYDROMORPHONE HCL 1 MG/ML IJ SOLN
INTRAMUSCULAR | Status: AC
  Filled 2018-11-03: qty 1

## 2018-11-03 MED ORDER — CHLORHEXIDINE GLUCONATE 4 % EX LIQD: 1.0000 "application " | Freq: Once | CUTANEOUS | Status: DC

## 2018-11-03 MED ORDER — LIDOCAINE 2% (20 MG/ML) 5 ML SYRINGE
INTRAMUSCULAR | Status: AC
  Filled 2018-11-03: qty 5

## 2018-11-03 MED ORDER — PROPOFOL 10 MG/ML IV BOLUS
INTRAVENOUS | Status: DC | PRN
  Administered 2018-11-03: 40 mg via INTRAVENOUS
  Administered 2018-11-03: 160 mg via INTRAVENOUS

## 2018-11-03 MED ORDER — DIPHENHYDRAMINE HCL 12.5 MG/5ML PO ELIX: 12.5000 mg | ORAL_SOLUTION | Freq: Four times a day (QID) | ORAL | Status: DC | PRN

## 2018-11-03 MED ORDER — PROMETHAZINE HCL 25 MG PO TABS
25.0000 mg | ORAL_TABLET | Freq: Once | ORAL | Status: AC
  Administered 2018-11-03: 18:00:00 25 mg via ORAL
  Filled 2018-11-03: qty 1

## 2018-11-03 MED ORDER — SENNA 8.6 MG PO TABS
1.0000 | ORAL_TABLET | Freq: Two times a day (BID) | ORAL | Status: DC
  Administered 2018-11-04 – 2018-11-05 (×3): 8.6 mg via ORAL
  Filled 2018-11-03 (×3): qty 1

## 2018-11-03 MED ORDER — MIDAZOLAM HCL 2 MG/2ML IJ SOLN
INTRAMUSCULAR | Status: AC
  Filled 2018-11-03: qty 2

## 2018-11-03 MED ORDER — PHENYLEPHRINE 40 MCG/ML (10ML) SYRINGE FOR IV PUSH (FOR BLOOD PRESSURE SUPPORT)
PREFILLED_SYRINGE | INTRAVENOUS | Status: AC
  Filled 2018-11-03: qty 10

## 2018-11-03 MED ORDER — EVICEL 5 ML EX KIT
PACK | CUTANEOUS | Status: AC
  Filled 2018-11-03: qty 1

## 2018-11-03 MED ORDER — DIPHENHYDRAMINE HCL 50 MG/ML IJ SOLN
12.5000 mg | Freq: Four times a day (QID) | INTRAMUSCULAR | Status: DC | PRN
  Administered 2018-11-03 – 2018-11-04 (×2): 12.5 mg via INTRAVENOUS
  Filled 2018-11-03 (×2): qty 1

## 2018-11-03 MED ORDER — DIAZEPAM 2 MG PO TABS
2.0000 mg | ORAL_TABLET | Freq: Two times a day (BID) | ORAL | Status: DC | PRN
  Administered 2018-11-04 – 2018-11-05 (×2): 2 mg via ORAL
  Filled 2018-11-03 (×2): qty 1

## 2018-11-03 MED ORDER — SODIUM CHLORIDE 0.9 % IV SOLN
INTRAVENOUS | Status: DC | PRN
  Administered 2018-11-03: 25 ug/min via INTRAVENOUS

## 2018-11-03 MED ORDER — ACETAMINOPHEN 325 MG PO TABS
325.0000 mg | ORAL_TABLET | Freq: Four times a day (QID) | ORAL | Status: DC
  Administered 2018-11-03 – 2018-11-05 (×6): 325 mg via ORAL
  Filled 2018-11-03 (×6): qty 1

## 2018-11-03 MED ORDER — ONDANSETRON HCL 4 MG/2ML IJ SOLN
4.0000 mg | Freq: Four times a day (QID) | INTRAMUSCULAR | Status: DC | PRN
  Filled 2018-11-03: qty 2

## 2018-11-03 MED ORDER — PHENYLEPHRINE HCL (PRESSORS) 10 MG/ML IV SOLN
INTRAVENOUS | Status: DC | PRN
  Administered 2018-11-03: 80 ug via INTRAVENOUS

## 2018-11-03 MED ORDER — ONDANSETRON HCL 4 MG/2ML IJ SOLN
INTRAMUSCULAR | Status: DC | PRN
  Administered 2018-11-03 (×2): 4 mg via INTRAVENOUS

## 2018-11-03 MED ORDER — SCOPOLAMINE 1 MG/3DAYS TD PT72
1.0000 | MEDICATED_PATCH | TRANSDERMAL | Status: DC
  Administered 2018-11-03 (×2): 1.5 mg via TRANSDERMAL
  Filled 2018-11-03: qty 1

## 2018-11-03 SURGICAL SUPPLY — 94 items
ADH SKN CLS APL DERMABOND .7 (GAUZE/BANDAGES/DRESSINGS)
ADH SKN CLS LQ APL DERMABOND (GAUZE/BANDAGES/DRESSINGS) ×3
APPLIER CLIP 9.375 MED OPEN (MISCELLANEOUS) ×2
APR CLP MED 9.3 20 MLT OPN (MISCELLANEOUS) ×1
BAG DECANTER FOR FLEXI CONT (MISCELLANEOUS) ×2 IMPLANT
BINDER BREAST 3XL (GAUZE/BANDAGES/DRESSINGS) ×1 IMPLANT
BINDER BREAST LRG (GAUZE/BANDAGES/DRESSINGS) IMPLANT
BINDER BREAST XLRG (GAUZE/BANDAGES/DRESSINGS) IMPLANT
BIOPATCH RED 1 DISK 7.0 (GAUZE/BANDAGES/DRESSINGS) ×5 IMPLANT
BLADE 10 SAFETY STRL DISP (BLADE) ×1 IMPLANT
BLADE SURG 10 STRL SS (BLADE) ×1 IMPLANT
BLADE SURG 15 STRL LF DISP TIS (BLADE) ×1 IMPLANT
BLADE SURG 15 STRL SS (BLADE)
CANISTER SUCT 3000ML PPV (MISCELLANEOUS) ×1 IMPLANT
CHLORAPREP W/TINT 26ML (MISCELLANEOUS) ×2 IMPLANT
CLIP APPLIE 9.375 MED OPEN (MISCELLANEOUS) ×1 IMPLANT
CONNECTOR 5 IN 1 STRAIGHT STRL (MISCELLANEOUS) ×2 IMPLANT
COVER SURGICAL LIGHT HANDLE (MISCELLANEOUS) ×2 IMPLANT
COVER WAND RF STERILE (DRAPES) ×1 IMPLANT
DECANTER SPIKE VIAL GLASS SM (MISCELLANEOUS) ×2 IMPLANT
DERMABOND ADHESIVE PROPEN (GAUZE/BANDAGES/DRESSINGS) ×3
DERMABOND ADVANCED (GAUZE/BANDAGES/DRESSINGS)
DERMABOND ADVANCED .7 DNX12 (GAUZE/BANDAGES/DRESSINGS) ×2 IMPLANT
DERMABOND ADVANCED .7 DNX6 (GAUZE/BANDAGES/DRESSINGS) IMPLANT
DRAIN CHANNEL 19F RND (DRAIN) ×5 IMPLANT
DRAPE HALF SHEET 40X57 (DRAPES) ×2 IMPLANT
DRAPE INCISE 23X17 IOBAN STRL (DRAPES) ×1
DRAPE INCISE 23X17 STRL (DRAPES) ×1 IMPLANT
DRAPE INCISE IOBAN 23X17 STRL (DRAPES) ×1 IMPLANT
DRAPE INCISE IOBAN 85X60 (DRAPES) IMPLANT
DRAPE ORTHO SPLIT 77X108 STRL (DRAPES) ×4
DRAPE SURG 17X23 STRL (DRAPES) ×4 IMPLANT
DRAPE SURG ORHT 6 SPLT 77X108 (DRAPES) ×2 IMPLANT
DRAPE UTILITY XL STRL (DRAPES) ×1 IMPLANT
DRAPE WARM FLUID 44X44 (DRAPE) ×2 IMPLANT
DRSG MEPILEX BORDER 4X8 (GAUZE/BANDAGES/DRESSINGS) ×2 IMPLANT
DRSG PAD ABDOMINAL 8X10 ST (GAUZE/BANDAGES/DRESSINGS) ×2 IMPLANT
ELECT BLADE 4.0 EZ CLEAN MEGAD (MISCELLANEOUS) ×2
ELECT BLADE 6.5 EXT (BLADE) IMPLANT
ELECT CAUTERY BLADE 6.4 (BLADE) ×2 IMPLANT
ELECT REM PT RETURN 9FT ADLT (ELECTROSURGICAL) ×2
ELECTRODE BLDE 4.0 EZ CLN MEGD (MISCELLANEOUS) ×1 IMPLANT
ELECTRODE REM PT RTRN 9FT ADLT (ELECTROSURGICAL) ×1 IMPLANT
EVACUATOR SILICONE 100CC (DRAIN) ×5 IMPLANT
GAUZE SPONGE 4X4 12PLY STRL (GAUZE/BANDAGES/DRESSINGS) ×2 IMPLANT
GLOVE BIO SURGEON STRL SZ 6.5 (GLOVE) ×7 IMPLANT
GLOVE BIO SURGEON STRL SZ7 (GLOVE) ×2 IMPLANT
GLOVE BIOGEL PI IND STRL 7.0 (GLOVE) IMPLANT
GLOVE BIOGEL PI IND STRL 7.5 (GLOVE) IMPLANT
GLOVE BIOGEL PI INDICATOR 7.0 (GLOVE) ×3
GLOVE BIOGEL PI INDICATOR 7.5 (GLOVE) ×4
GLOVE ECLIPSE 7.5 STRL STRAW (GLOVE) ×2 IMPLANT
GOWN STRL REUS W/ TWL LRG LVL3 (GOWN DISPOSABLE) ×2 IMPLANT
GOWN STRL REUS W/TWL LRG LVL3 (GOWN DISPOSABLE) ×16
IMPL EXPANDER BREAST 455CC (Breast) IMPLANT
IMPLANT BREAST 455CC (Breast) ×1 IMPLANT
IMPLANT EXPANDER BREAST 455CC (Breast) ×1 IMPLANT
KIT BASIN OR (CUSTOM PROCEDURE TRAY) ×2 IMPLANT
KIT TURNOVER KIT B (KITS) ×2 IMPLANT
NDL 18GX1X1/2 (RX/OR ONLY) (NEEDLE) IMPLANT
NDL HYPO 25GX1X1/2 BEV (NEEDLE) ×1 IMPLANT
NEEDLE 18GX1X1/2 (RX/OR ONLY) (NEEDLE) ×2 IMPLANT
NEEDLE HYPO 25GX1X1/2 BEV (NEEDLE) ×2 IMPLANT
NS IRRIG 1000ML POUR BTL (IV SOLUTION) ×4 IMPLANT
PACK GENERAL/GYN (CUSTOM PROCEDURE TRAY) ×2 IMPLANT
PAD ABD 8X10 STRL (GAUZE/BANDAGES/DRESSINGS) ×3 IMPLANT
PAD ARMBOARD 7.5X6 YLW CONV (MISCELLANEOUS) ×4 IMPLANT
PENCIL BUTTON HOLSTER BLD 10FT (ELECTRODE) ×1 IMPLANT
PENCIL SMOKE EVACUATOR (MISCELLANEOUS) ×1 IMPLANT
PIN SAFETY STERILE (MISCELLANEOUS) ×2 IMPLANT
SET ASEPTIC TRANSFER (MISCELLANEOUS) ×1 IMPLANT
SLEEVE SURGEON STRL (DRAPES) ×1 IMPLANT
SPONGE LAP 18X18 RF (DISPOSABLE) ×4 IMPLANT
STAPLER VISISTAT 35W (STAPLE) ×1 IMPLANT
STRIP CLOSURE SKIN 1/2X4 (GAUZE/BANDAGES/DRESSINGS) IMPLANT
SUT MNCRL AB 3-0 PS2 18 (SUTURE) ×14 IMPLANT
SUT MNCRL AB 4-0 PS2 18 (SUTURE) ×6 IMPLANT
SUT MON AB 5-0 PS2 18 (SUTURE) ×5 IMPLANT
SUT PDS AB 2-0 CT1 27 (SUTURE) ×4 IMPLANT
SUT PDS AB 3-0 SH 27 (SUTURE) IMPLANT
SUT SILK 3 0 FS 1X18 (SUTURE) ×5 IMPLANT
SUT SILK 3 0 SH 30 (SUTURE) ×1 IMPLANT
SUT VIC AB 3-0 SH 27 (SUTURE)
SUT VIC AB 3-0 SH 27X BRD (SUTURE) ×3 IMPLANT
SUT VICRYL 4-0 PS2 18IN ABS (SUTURE) ×2 IMPLANT
SYR BULB IRRIGATION 50ML (SYRINGE) ×2 IMPLANT
SYR CONTROL 10ML LL (SYRINGE) ×2 IMPLANT
TOWEL GREEN STERILE (TOWEL DISPOSABLE) ×2 IMPLANT
TOWEL GREEN STERILE FF (TOWEL DISPOSABLE) ×2 IMPLANT
TOWEL OR 17X24 6PK STRL BLUE (TOWEL DISPOSABLE) ×2 IMPLANT
TOWEL OR 17X26 10 PK STRL BLUE (TOWEL DISPOSABLE) ×1 IMPLANT
TRAY FOLEY MTR SLVR 14FR STAT (SET/KITS/TRAYS/PACK) IMPLANT
TUBE CONNECTING 12X1/4 (SUCTIONS) ×2 IMPLANT
YANKAUER SUCT BULB TIP NO VENT (SUCTIONS) ×1 IMPLANT

## 2018-11-03 NOTE — Anesthesia Procedure Notes (Signed)
Procedure Name: Intubation Date/Time: 11/03/2018 7:43 AM Performed by: Kathryne Hitch, CRNA Pre-anesthesia Checklist: Patient identified, Emergency Drugs available, Suction available and Patient being monitored Patient Re-evaluated:Patient Re-evaluated prior to induction Oxygen Delivery Method: Circle system utilized Preoxygenation: Pre-oxygenation with 100% oxygen Induction Type: IV induction Laryngoscope Size: Miller and 2 Grade View: Grade I Tube type: Oral Tube size: 7.0 mm Number of attempts: 1 Airway Equipment and Method: Stylet and Oral airway Placement Confirmation: ETT inserted through vocal cords under direct vision,  positive ETCO2 and breath sounds checked- equal and bilateral Secured at: 22 cm Tube secured with: Tape Dental Injury: Teeth and Oropharynx as per pre-operative assessment

## 2018-11-03 NOTE — Transfer of Care (Signed)
Immediate Anesthesia Transfer of Care Note  Patient: Mackenzie [name]  Procedure(s) Performed: LATISSIMUS FLAP TO LEFT BREAST (Left Breast) PLACEMENT OF TISSUE EXPANDER LEFT BREAST (Left Breast)  Patient Location: PACU  Anesthesia Type:General  Level of Consciousness: awake and patient cooperative  Airway & Oxygen Therapy: Patient Spontanous Breathing and Patient connected to face mask oxygen  Post-op Assessment: Report given to RN and Post -op Vital signs reviewed and stable  Post vital signs: Reviewed and stable  Last Vitals:  Vitals Value Taken Time  BP 106/80 11/03/2018 11:56 AM  Temp    Pulse 81 11/03/2018 11:57 AM  Resp 19 11/03/2018 11:57 AM  SpO2 99 % 11/03/2018 11:57 AM  Vitals shown include unvalidated device data.  Last Pain:  Vitals:   11/03/18 0553  TempSrc: Oral  PainSc: 0-No pain         Complications: No apparent anesthesia complications

## 2018-11-03 NOTE — Interval H&P Note (Signed)
History and Physical Interval Note:  11/03/2018 7:25 AM  Mackenzie [name]  has presented today for surgery, with the diagnosis of Malignant Neoplasm Of Overlapping Sites Of Left Breast In Female, Estrogen Receptor Positive, Acquired absence of left breast.  The various methods of treatment have been discussed with the patient and family. After consideration of risks, benefits and other options for treatment, the patient has consented to  Procedure(s) with comments: LATISSIMUS FLAP TO BREAST (Left) PLACEMENT OF TISSUE EXPANDER (Left) - Total case time is 3.5 hours as a surgical intervention.  The patient's history has been reviewed, patient examined, no change in status, stable for surgery.  I have reviewed the patient's chart and labs.  Questions were answered to the patient's satisfaction.     Loel Lofty Choua Chalker

## 2018-11-03 NOTE — Progress Notes (Signed)
Patient admitted to unit from PACU, VSS. Patient settled in bed and oriented to unit and hospital routine. Pt resting comfortably in bed with call bell in reach and side rails up. Instructed patient to utilize call bell for assistance. Patients dressings are clean dry and intact. JP drains x3 in place, patent for sanguinous fluid. Will continue to monitor closely for remainder of shift.

## 2018-11-03 NOTE — Anesthesia Postprocedure Evaluation (Signed)
Anesthesia Post Note  Patient: Mackenzie [name]  Procedure(s) Performed: LATISSIMUS FLAP TO LEFT BREAST (Left Breast) PLACEMENT OF TISSUE EXPANDER LEFT BREAST (Left Breast)     Patient location during evaluation: PACU Anesthesia Type: General Level of consciousness: awake and alert Pain management: pain level controlled Vital Signs Assessment: post-procedure vital signs reviewed and stable Respiratory status: spontaneous breathing, nonlabored ventilation and respiratory function stable Cardiovascular status: blood pressure returned to baseline and stable Postop Assessment: no apparent nausea or vomiting Anesthetic complications: no    Last Vitals:  Vitals:   11/03/18 1240 11/03/18 1255  BP: 139/76 (!) 154/75  Pulse: 72 64  Resp: 15 12  Temp:    SpO2: 93% 94%    Last Pain:  Vitals:   11/03/18 1245  TempSrc:   PainSc: Asleep                 Arnisha Laffoon,W. EDMOND

## 2018-11-03 NOTE — Op Note (Signed)
DATE OF OPERATION: 11/03/2018  LOCATION: [facility] Inpatient  PREOPERATIVE DIAGNOSIS:  1. Breast Cancer Status post left mastectomy. 2.   Acquired absence of left Breast. 3. C apsulectomy of left breast pocket  POSTOPERATIVE DIAGNOSIS: Same  PROCEDURE: 1. Latissimus myocutaneous flap  to reconstruct the left breast CPT 19361 2. Tissue expander placement CPT 19357 with 250 cc of saline placed in a 455 cc expander  SURGEON: Lyndee Leo Sanger Aryon Nham, DO  ASSISTANT: Bonita Cox, RNFA  EBL: 150 cc  SPECIMEN: None  DRAINS: three total 87 blake round drains  CONDITION: Stable  COMPLICATIONS: None  INDICATION: The patient, Mackenzie [name], is a 48 y.o. female born on May 03, 1971, is here for treatment after a left mastectomy with resulting absence of breast and asymmetry.   PROCEDURE DETAILS:  The patient was seen on the morning of her surgery and marked for the location of the flap.  An IV was placed and IV antibiotics were given. The patient was taken to the operating room and placed on the operating room table.  A general anesthetic was administered.  A standard time out was performed and all information was confirmed to be correct by those in the room. Leg compression devices were placed on her legs.  The patient was placed in the right lateral decubitus position on a beanbag.  All key prominent points were padded and an axillary roll was placed in the dependent axilla. The ipsilateral arm was placed on a padded rest anteriosuperiorly.  She was then prepped and draped in the standard sterile fashion. The paddle design and position were confirmed.   The procedure began by incising the skin at the marked skin paddle.  The #10 blade and bovie were used to dissect down to the latissimus muscle.  Anterior and posterior flaps were raised to expose the latissimus muscle edges.  The skin and fat flaps were elevated to the extent necessary for release of the muscle.  The muscle was  released at the superior edge of the inferior angle of the scapula.  This aids in identification of the serratus muscle to prevent lifting it with the latissimus muscle.  The larger caliber perforator were clipped and the smaller vessels controlled with electrocautery.  The dissection continued toward the midline and inferior toward the iliac crest.  The subscapular artery to the thoracodorsal artery was palpated in the axilla.  The branch to the serratus is ligated.  Care was taken to protect the vascular pedicle throughout this portion of the procedure. The paddle and muscle looked healthy throughout the case.  The old mastectomy scar was incised and the flaps on top of the pectoralis muscle were raised.  The posterior and anterior pockets were then connected in the plane above the muscle. The muscle from the back and the skin paddle were then rotated into the chest pocket and covered with an ioban dressing. The flap and skin pedicle were inspected and there was no tension. The back pocket was hemostased and Evicel placed. Two #19 blake round drains were place in the anterior skin flap and secured with 3-0 Silk. One drain was positioned inferiorly and one superiorly.  The back incision was closed in layers with buried 3-0 Monocryl, followed by 4-0 Monocryl and 5-0 Monocryl.  Dermabond and a protective dressing was applied.   The patient was repositioned onto her back and the chest was prepped and draped. The breast pocket was inspected and hemostases was achieved with electrocautery. A capsulectomy was performed as the  pocket was so tight due to the radiation.  The muscle was then secured superiorly to the pectoralis muscle with 3-0 Monocryl. A 455 cc expander was chosen. It was soaked in triple antibiotic solution and evacuated of air and then filled with 250 cc of sterile saline. The inferior portion of the muscle was tacked to the inframammary fold with 3-0 Monocryl.  One drain was placed on this side and  secured with 3-0 Silk. The flap was then closed with 3-0 Monocryl deep, followed by 4-0 Monocryl and the skin closed with 5-0 Monocryl.  Dermabond, ABDs and a breast binder was applied.    The patient was allowed to wake up and taken to recovery room in stable condition at the end of the case. The family was notified at the end of the case.   The RNFA assisted throughout the case.  The RNFA was essential in retraction and counter traction when needed to make the case progress smoothly.  This retraction and assistance made it possible to see the tissue plans for the procedure.  The assistance was needed for blood control, tissue re-approximation and assisted with closure of the incision site.

## 2018-11-03 NOTE — Discharge Instructions (Signed)
INSTRUCTIONS FOR AFTER BREAST SURGERY   You are getting ready to undergo breast surgery.  You will likely have some questions about what to expect following your operation.  The following information will help you and your family understand what to expect when you are discharged from the hospital.  Following these guidelines will help ensure a smooth recovery and reduce risks of complications.   Postoperative instructions include information on: diet, wound care, medications and physical activity.  AFTER SURGERY Expect to spend one night in the hospital for observation.  DIET Breast surgery does not require a specific diet.  However, I have to mention that the healthier you eat the better your body can start healing. It is important to increasing your protein intake.  This means limiting the foods with sugar and carbohydrates.  Focus on vegetables and some meat.  If you have any liposuction during your procedure be sure to drink water.  If your urine is bright yellow, then it is concentrated, and you need to drink more water.  As a general rule after surgery, you should have 8 ounces of water every hour while awake.  If you find you are persistently nauseated or unable to take in liquids let us know.  NO TOBACCO USE or EXPOSURE.  This will slow your healing process and increase the risk of a wound.  WOUND CARE You can shower the day after your drain is removed.  Use fragrance free soap.  Dial, [place] and [place] are usually mild on the skin. While you have a drain clean with baby wipes until the drain is removed.  If you have steri-strips / tape directly attached to your skin leave them in place. It is OK to get these wet.  No baths, pools or hot tubs for two weeks. We close your incision to leave the smallest and best-looking scar. No ointment or creams on your incisions until given the go ahead.  Especially not Neosporin (Too many skin reactions with this one).  A few weeks after surgery you can use  Mederma and start massaging the scar. We ask you to wear your binder or sports bra for the first 6 weeks around the clock, including while sleeping. This provides added comfort and helps reduce the fluid accumulation at the surgery site.  ACTIVITY No heavy lifting until cleared by the doctor.  This usually means no more than a half-gallon of milk.  It is OK to walk and climb stairs. In fact, moving your legs is very important to decrease your risk of a blood clot.  It will also help keep you from getting deconditioned.  Every 1 to 2 hours get up and walk for 5 minutes. This will help with a quicker recovery back to normal.  Let pain be your guide so you don't do too much.  NO, you cannot do the spring cleaning and don't plan on taking care of anyone else.  This is your time for TLC.  You will be more comfortable if you sleep and rest with your head elevated either with a few pillows under you or in a recliner.  No stomach sleeping for a few months.  WORK Everyone returns to work at different times. As a rough guide, most people take at least 1 - 2 weeks off prior to returning to work. If you need documentation for your job, bring the forms to your postoperative follow up visit.  DRIVING Arrange for someone to bring you home from the hospital.  You  may be able to drive a few days after surgery but not while taking any narcotics or valium.  BOWEL MOVEMENTS Constipation can occur after anesthesia and while taking pain medication.  It is important to stay ahead for your comfort.  We recommend taking Milk of Magnesia (2 tablespoons; twice a day) while taking the pain pills.  SEROMA This is fluid your body tried to put in the surgical site.  This is normal but if it creates tight skinny skin let us know.  It usually decreases in a few weeks.  WHEN TO CALL Call your surgeon's office if any of the following occur:  Fever 101 degrees F or greater  Excessive bleeding or fluid from the incision  site.  Pain that increases over time without aid from the medications  Redness, warmth, or pus draining from incision sites  Persistent nausea or inability to take in liquids  Severe misshapen area that underwent the operation.  Here are some resources:  1. Plastic surgery website: https://www.plasticsurgery.org/for-medical-professionals/education-and-resources/publications/breast-reconstruction-magazine 2. Breast Reconstruction Awareness Campaign:  HotelLives.co.nz 3. Plastic surgery Implant information:  https://www.plasticsurgery.org/patient-safety/breast-implant-safety

## 2018-11-04 ENCOUNTER — Encounter (HOSPITAL_COMMUNITY): Payer: Self-pay | Admitting: General Practice

## 2018-11-04 LAB — HIV ANTIBODY (ROUTINE TESTING W REFLEX): HIV Screen 4th Generation wRfx: NONREACTIVE

## 2018-11-04 MED ORDER — HYDROXYZINE HCL 10 MG PO TABS: 10.0000 mg | ORAL_TABLET | Freq: Three times a day (TID) | ORAL | 0 refills | Status: AC | PRN

## 2018-11-04 MED ORDER — MORPHINE SULFATE (PF) 2 MG/ML IV SOLN
2.0000 mg | INTRAVENOUS | Status: DC | PRN
  Administered 2018-11-04: 2 mg via INTRAVENOUS
  Filled 2018-11-04: qty 1

## 2018-11-04 MED ORDER — NAPROXEN 250 MG PO TABS: 500.0000 mg | ORAL_TABLET | Freq: Two times a day (BID) | ORAL | Status: DC | PRN

## 2018-11-04 MED ORDER — KETOROLAC TROMETHAMINE 10 MG PO TABS
10.0000 mg | ORAL_TABLET | Freq: Four times a day (QID) | ORAL | Status: DC | PRN
  Administered 2018-11-04 – 2018-11-05 (×2): 10 mg via ORAL
  Filled 2018-11-04 (×5): qty 1

## 2018-11-04 MED ORDER — LETROZOLE 2.5 MG PO TABS
2.5000 mg | ORAL_TABLET | Freq: Every day | ORAL | Status: DC
  Administered 2018-11-04 – 2018-11-05 (×2): 2.5 mg via ORAL
  Filled 2018-11-04 (×2): qty 1

## 2018-11-04 MED ORDER — HYDROXYZINE PAMOATE 100 MG PO CAPS: 100.0000 mg | ORAL_CAPSULE | Freq: Three times a day (TID) | ORAL | 0 refills | Status: DC | PRN

## 2018-11-04 NOTE — Plan of Care (Signed)
  Problem: Skin Integrity: Goal: Risk for impaired skin integrity will decrease Outcome: Progressing   Problem: Pain Managment: Goal: General experience of comfort will improve Outcome: Progressing   

## 2018-11-04 NOTE — Discharge Summary (Addendum)
Physician Discharge Summary  Patient ID: Mackenzie [name] MRN: [id number] DOB/AGE: [birth year] 48 y.o.  Admit date: 11/03/2018 Discharge date: 11/04/2018 discharged 11/05/18  Admission Diagnoses:  Discharge Diagnoses:  Active Problems:   Acquired absence of left breast   Discharged Condition: good  Hospital Course: The patient was taken to the OR and underwent a left breast reconstruction with a latissimus flap with expander placement.  She did well throughout surgery.  She was sent to the surgery postoperative recovery unit.  She did very well last night. She is eating without difficulty.  She is walking and voiding without trouble.  She is ready to go home and pain is managed on the norco.   Due to itching and pain the patient was kept another night.  Consults: None  Significant Diagnostic Studies: none  Treatments: surgery  Discharge Exam: Blood pressure (!) 103/56, pulse 74, temperature 97.7 F (36.5 C), temperature source Oral, resp. rate 16, height 5\' 6"  (1.676 m), weight 118.4 kg, SpO2 99 %, unknown if currently breastfeeding. General appearance: alert, cooperative and no distress Incision/Wound: flap is intact and has good capillary refill.  Drains working well and incisions closed.  Disposition: Discharge disposition: 01-Home or Self Care       Discharge Instructions    Call MD for:  persistant nausea and vomiting   Complete by:  As directed    Call MD for:  redness, tenderness, or signs of infection (pain, swelling, redness, odor or green/yellow discharge around incision site)   Complete by:  As directed    Call MD for:  severe uncontrolled pain   Complete by:  As directed    Call MD for:  temperature >100.4   Complete by:  As directed    Diet general   Complete by:  As directed    Discharge wound care:   Complete by:  As directed    Drain Care   Driving Restrictions   Complete by:  As directed    No driving while on pain meds   Increase activity slowly    Complete by:  As directed    Lifting restrictions   Complete by:  As directed    No heavy lifting      Follow-up Information    Mayan Kloepfer, Loel Lofty, DO In 1 week.   Specialty:  Plastic Surgery Contact information: [address] [place] [address] [phone]           Signed: [place] 11/04/2018, 7:21 AM

## 2018-11-04 NOTE — Addendum Note (Signed)
Addended by: Wallace Going on: 11/04/2018 04:31 PM   Modules accepted: Orders

## 2018-11-04 NOTE — Progress Notes (Signed)
Pt ambulated 800 ft twice, tolerated well. Audible bowel sounds. IVF's infusing without  Difficulty. Dressings changed per MD orders. L breast surgical site Clean and dry. Abd/ Chest binder in place. Pt with call light and phone in reach. Rounds maintained per unit policy and protocol.

## 2018-11-05 ENCOUNTER — Telehealth: Payer: Self-pay

## 2018-11-05 ENCOUNTER — Encounter (HOSPITAL_COMMUNITY): Payer: Self-pay | Admitting: Plastic Surgery

## 2018-11-05 MED FILL — hydrOXYzine HCL 10 MG TABS: 10 | 5 days supply | Qty: 15 | Fill #0

## 2018-11-05 NOTE — Progress Notes (Signed)
Pt requested to spend another night in the hospital due to her continued itching. Pain medication changed To see if that was what was causing the pt to itch. Pt has been given medication to help with itching, but it has not been effective. Continue to monitor.

## 2018-11-05 NOTE — Telephone Encounter (Signed)
visited pt in rm 6N-11 at [facility] Pt is doing well- she reports that she is ambulating, eating & drinking well- she has normal bowel/bladder functions- Pain has decreased but is described as 8/10 when up & moving I instructed her to continue to walk & deep breath & cough to keep her lungs clear. She is performing the incentive spirometer several times a day without difficulty Her dressing is dry & intact- drains are intact & draining approx. 61ml/24hrs x3 She is being d/c today- & she has a f/u visit with Dr. Marla Roe next week I reminded her to call for any questions or concerns [place]

## 2018-11-05 NOTE — Plan of Care (Signed)
  Problem: Education: Goal: Knowledge of General Education information will improve Description: Including pain rating scale, medication(s)/side effects and non-pharmacologic comfort measures Outcome: Progressing   Problem: Health Behavior/Discharge Planning: Goal: Ability to manage health-related needs will improve Outcome: Progressing   Problem: Activity: Goal: Risk for activity intolerance will decrease Outcome: Progressing   Problem: Coping: Goal: Level of anxiety will decrease Outcome: Progressing   Problem: Elimination: Goal: Will not experience complications related to bowel motility Outcome: Progressing Goal: Will not experience complications related to urinary retention Outcome: Progressing   Problem: Pain Managment: Goal: General experience of comfort will improve Outcome: Progressing   Problem: Safety: Goal: Ability to remain free from injury will improve Outcome: Progressing   Problem: Skin Integrity: Goal: Risk for impaired skin integrity will decrease Outcome: Progressing   

## 2018-11-08 ENCOUNTER — Other Ambulatory Visit: Payer: Self-pay | Admitting: *Deleted

## 2018-11-08 NOTE — Patient Outreach (Signed)
[name]) Care [name]  11/08/2018  Mackenzie [name] 1971-05-22 [id number]  Transition of care telephone call  Referral received: 11/04/18 Initial outreach: 11/08/18 Insurance: [place]   Initial unsuccessful telephone call to patient's preferred (mobile) number in order to complete transition of care assessment; no answer, left HIPAA compliant voicemail message requesting return call.   Objective: Per the electronic medical record, Mackenzie [name] was hospitalized at [facility] from 5/27-5/29/20 for latissimus flap to left breast. Mackenzie [name] was diagnosed with invasive ductal breast cancer on 01/14/17, she underwent chemotherapy and radiation and had a left modified radical mastectomy on 08/28/27.  Comorbidities include: HTN, and morbid obesity.  She was discharged to home on 11/05/18 without the need for home health services or durable medical equipment per the discharge summary.   Plan: This RNCM will route unsuccessful outreach letter with Mackenzie [name] pamphlet and 24 hour Nurse Advice Line Magnet to Mackenzie [name] clinical pool to be mailed to patient's home address. This RNCM will attempt another outreach within 4 business days.  Mackenzie Ellison RN,CCM,CDE [place] [name] Coordinator Office Phone [phone] Office Fax [phone]

## 2018-11-09 ENCOUNTER — Other Ambulatory Visit: Payer: Self-pay | Admitting: *Deleted

## 2018-11-09 DIAGNOSIS — C50912 Malignant neoplasm of unspecified site of left female breast: Secondary | ICD-10-CM

## 2018-11-11 ENCOUNTER — Telehealth: Payer: Self-pay | Admitting: *Deleted

## 2018-11-11 ENCOUNTER — Other Ambulatory Visit: Payer: Self-pay | Admitting: *Deleted

## 2018-11-11 ENCOUNTER — Ambulatory Visit: Payer: Self-pay | Admitting: *Deleted

## 2018-11-11 ENCOUNTER — Other Ambulatory Visit: Payer: 59

## 2018-11-11 NOTE — Patient Outreach (Signed)
[facility]) Care Management  11/11/2018  Damali BERNARDETTE WALDRON 1971-02-15 [id number]  Transition of care telephone call  Referral received: 11/04/18 Initial outreach: 11/08/18 Insurance: [place]   Second unsuccessful attempt to reach patient on her preferred (mobile) number in order to complete transition of care assessment; no answer, left HIPAA compliant voicemail message requesting return call.   Objective: Per the electronic medical record, Lesslie Pinder was hospitalized at [facility] from 5/27-5/29/20 for latissimus flap to left breast. [place] was diagnosed with invasive ductal breast cancer on 01/14/17, she underwent chemotherapy and radiation and had a left modified radical mastectomy on 08/28/27.  Comorbidities include: HTN, and morbid obesity.  She was discharged to home on 11/05/18 without the need for home health services or durable medical equipment per the discharge summary.   Plan: This RNCM will attempt another outreach within 4 business days.  Barrington Ellison RN,CCM,CDE [place] Management Coordinator Office Phone [phone] Office Fax [phone]

## 2018-11-11 NOTE — Telephone Encounter (Signed)
[name]- LAB for Cycle 7- LVM for patient reminding her to fast for at least 8 hrs prior to her lab appointment tomorrow morning.  Reminded patient to not take Letrozole until after her lab appointment.  Informed patient there are study questionnaires to complete at this time point and asked if patient can come in early to complete questionnaires before her lab appointment tomorrow.  Asked patient to call back if any problem with this appt or any questions.  Otherwise will plan to see her in the morning. Thanked patient for her time.  Foye Spurling, BSN, RN Clinical Research Nurse 11/11/2018 11:10 AM

## 2018-11-12 ENCOUNTER — Inpatient Hospital Stay: Payer: 59 | Attending: Adult Health

## 2018-11-12 ENCOUNTER — Encounter: Payer: Self-pay | Admitting: *Deleted

## 2018-11-12 ENCOUNTER — Ambulatory Visit (INDEPENDENT_AMBULATORY_CARE_PROVIDER_SITE_OTHER): Payer: 59 | Admitting: Plastic Surgery

## 2018-11-12 ENCOUNTER — Encounter: Payer: Self-pay | Admitting: Plastic Surgery

## 2018-11-12 ENCOUNTER — Other Ambulatory Visit: Payer: Self-pay

## 2018-11-12 VITALS — BP 131/82 | HR 87 | Temp 98.5°F | Ht 66.0 in | Wt 272.4 lb

## 2018-11-12 DIAGNOSIS — C50212 Malignant neoplasm of upper-inner quadrant of left female breast: Secondary | ICD-10-CM | POA: Insufficient documentation

## 2018-11-12 DIAGNOSIS — C773 Secondary and unspecified malignant neoplasm of axilla and upper limb lymph nodes: Secondary | ICD-10-CM

## 2018-11-12 DIAGNOSIS — R21 Rash and other nonspecific skin eruption: Secondary | ICD-10-CM | POA: Insufficient documentation

## 2018-11-12 DIAGNOSIS — Z923 Personal history of irradiation: Secondary | ICD-10-CM | POA: Insufficient documentation

## 2018-11-12 DIAGNOSIS — Z5111 Encounter for antineoplastic chemotherapy: Secondary | ICD-10-CM | POA: Insufficient documentation

## 2018-11-12 DIAGNOSIS — Z9221 Personal history of antineoplastic chemotherapy: Secondary | ICD-10-CM | POA: Insufficient documentation

## 2018-11-12 DIAGNOSIS — C50912 Malignant neoplasm of unspecified site of left female breast: Secondary | ICD-10-CM

## 2018-11-12 DIAGNOSIS — Z79811 Long term (current) use of aromatase inhibitors: Secondary | ICD-10-CM | POA: Insufficient documentation

## 2018-11-12 DIAGNOSIS — Z170 Estrogen receptor positive status [ER+]: Secondary | ICD-10-CM

## 2018-11-12 DIAGNOSIS — Z9012 Acquired absence of left breast and nipple: Secondary | ICD-10-CM

## 2018-11-12 DIAGNOSIS — Z006 Encounter for examination for normal comparison and control in clinical research program: Secondary | ICD-10-CM | POA: Insufficient documentation

## 2018-11-12 LAB — CBC WITH DIFFERENTIAL (CANCER CENTER ONLY)
Abs Immature Granulocytes: 0.04 10*3/uL (ref 0.00–0.07)
Basophils Absolute: 0 10*3/uL (ref 0.0–0.1)
Basophils Relative: 1 %
Eosinophils Absolute: 0.1 10*3/uL (ref 0.0–0.5)
Eosinophils Relative: 2 %
HCT: 34.1 % — ABNORMAL LOW (ref 36.0–46.0)
Hemoglobin: 10.7 g/dL — ABNORMAL LOW (ref 12.0–15.0)
Immature Granulocytes: 1 %
Lymphocytes Relative: 20 %
Lymphs Abs: 1.2 10*3/uL (ref 0.7–4.0)
MCH: 28.2 pg (ref 26.0–34.0)
MCHC: 31.4 g/dL (ref 30.0–36.0)
MCV: 90 fL (ref 80.0–100.0)
Monocytes Absolute: 0.6 10*3/uL (ref 0.1–1.0)
Monocytes Relative: 10 %
Neutro Abs: 4 10*3/uL (ref 1.7–7.7)
Neutrophils Relative %: 66 %
Platelet Count: 266 10*3/uL (ref 150–400)
RBC: 3.79 MIL/uL — ABNORMAL LOW (ref 3.87–5.11)
RDW: 14.1 % (ref 11.5–15.5)
WBC Count: 5.9 10*3/uL (ref 4.0–10.5)
nRBC: 0 % (ref 0.0–0.2)

## 2018-11-12 LAB — LIPASE, BLOOD: Lipase: 22 U/L (ref 11–51)

## 2018-11-12 LAB — GAMMA GT: GGT: 31 U/L (ref 7–50)

## 2018-11-12 LAB — CMP (CANCER CENTER ONLY)
ALT: 30 U/L (ref 0–44)
AST: 19 U/L (ref 15–41)
Albumin: 3.4 g/dL — ABNORMAL LOW (ref 3.5–5.0)
Alkaline Phosphatase: 126 U/L (ref 38–126)
Anion gap: 8 (ref 5–15)
BUN: 14 mg/dL (ref 6–20)
CO2: 27 mmol/L (ref 22–32)
Calcium: 9.2 mg/dL (ref 8.9–10.3)
Chloride: 102 mmol/L (ref 98–111)
Creatinine: 0.83 mg/dL (ref 0.44–1.00)
GFR, Est AFR Am: >60 mL/min (ref >60)
GFR, Estimated: >60 mL/min (ref >60)
Glucose, Bld: 109 mg/dL — ABNORMAL HIGH (ref 70–99)
Potassium: 4.1 mmol/L (ref 3.5–5.1)
Sodium: 137 mmol/L (ref 135–145)
Total Bilirubin: 0.3 mg/dL (ref 0.3–1.2)
Total Protein: 7.2 g/dL (ref 6.5–8.1)

## 2018-11-12 LAB — LACTATE DEHYDROGENASE: LDH: 151 U/L (ref 98–192)

## 2018-11-12 LAB — URIC ACID: Uric Acid, Serum: 3.5 mg/dL (ref 2.5–7.1)

## 2018-11-12 LAB — BILIRUBIN, DIRECT: Bilirubin, Direct: 0.1 mg/dL (ref 0.0–0.2)

## 2018-11-12 LAB — AMYLASE: Amylase: 64 U/L (ref 28–100)

## 2018-11-12 LAB — PHOSPHORUS: Phosphorus: 4.5 mg/dL (ref 2.5–4.6)

## 2018-11-12 LAB — MAGNESIUM: Magnesium: 1.7 mg/dL (ref 1.7–2.4)

## 2018-11-12 MED FILL — LETROZOLE 2.5 MG TABS: 2.5 | 30 days supply | Qty: 30 | Fill #0

## 2018-11-12 NOTE — Research (Signed)
Assessment prior to C7D1 of [name]: Patient into clinic by herself this morning to complete some of the study activities prior to cycle 7.   PROs; Study questionnaires given to patient to complete prior to her lab appointment this morning.  Patient completed by herself while waiting for lab appointment. Research nurse collected and checked for completeness and accuracy.  Labs; Labs drawn per protocol.  Patient confirmed she has been fasting for at least 8 hours prior to this appointment. Informed patient research nurse will call her if any urgent results today.  Concomitant Medications; Reviewed medication list with patient.  Completed a course of antibiotics after discharge from hospital. Taking diazepam, hydrocodone and promethazine PRN for symptoms related to surgery. Medication list updated.  AEs; Patient had breast reconstruction surgery on 11/03/18 and stayed 2 nights in the hospital.  No reported complications of surgery.  Patient reports the following post-op symptoms;   Generalized Itching- patient thinks might have been side effect of pain medication and was given hydroxyzine.  Pain at breast surgical site since surgery and now related to having two drains, taking hydrocodone prn.   Nausea at least twice since surgery, once or twice in hospital and again 2 nights ago, taking phenergan prn.   AE table below will be completed after study/ MD visit on Monday 11/15/18  Reminded patient to bring in her Ribociclib bottle with leftover pills and her medication diary on Monday. Remind patient to not take her Letrozole before her appointment as she will need to take in clinic on Monday.  Informed patient that this research nurse will be working from home next week and another research nurse, Tyrell Antonio, will see her on Monday to collect diary, meds and dispense new cycle of meds and diary if needed.  Informed patient that Dr. Jana Hakim may decide to continue to hold Ribociclib on Monday if he  feels she has not recovered from her surgery adequately.  Explained to patient this will be at his discretion.  She would still need to get Zoladex injection as scheduled and continue Letrozole. Patient verbalized understanding.   Thanked patient for her time today and encouraged to call if any questions or concerns prior to Monday.  Foye Spurling, BSN, RN Clinical Research Nurse 11/12/2018 10:59 AM  Phone call: Noted that Phenergan is listed as a medication to "be used with caution" with Ribociclib as it carries a possible risk of QT prolongation.  Called patient and left her a VM informing her of above and encouraging her to avoid taking phenergan if possible.    Foye Spurling, BSN, RN Clinical Research Nurse 11/12/2018 2:58 PM .    AE Table: Event Grade Onset Date End Date Status Comments Attribution to Ribociclib Attribution to Letrozole Attribution to Goserelin  Allergic Rhinitis 2 2012  ongoing At Baseline n/a n/a n/a  Anemia 1 2018  ongoing At Baseline n/a n/a n/a  Constipation 1 08/27/17  ongoing At Baseline n/a n/a n/a  Cough, intermittent 2 05/09/18  ongoing At Baseline n/a n/a n/a  GERD, intermittent 1 2018  ongoing At Baseline n/a n/a n/a  Gluten Sensitivity 1 12/2017  ongoing At Baseline n/a n/a n/a  Headaches 1 02/2017  ongoing At Baseline n/a n/a n/a  Hot Flashes 2 03/2017  ongoing At Baseline n/a n/a n/a  Hyperglycemia 1 2018  ongoing At Baseline n/a n/a n/a  Hyperlipidemia 1 03/2016  ongoing At Baseline n/a n/a n/a  Vitamin D Deficiency 2 05/2015  ongoing At Baseline n/a  n/a n/a  Fatigue 1 06/28/18  ongoing   Yes No No  Blurred Vision 1 07/14/18  ongoing  No No No  Decreased WBC 1 08/20/18 11/12/18 Resolved  Yes No No  Arthralgias bilat legs 2 09/10/18 10/01/18 Resolved to baseline  No Yes No  Hypertension 1 09/20/18  ongoing  No No No  Surgical Procedure 2 11/03/18 11/03/18 completed Planned breast reconstruction No No No  Pain at surgical site 2 10/2718  ongoing  No No No   Nausea, intermittent 1 11/03/18  ongoing  No No No  Itching 2 11/03/18 11/05/18 resolved  No No No  Hypoalbuminemia 1 11/12/18  ongoing Clinically Insignificant No No No    Foye Spurling, BSN, RN Clinical Research Nurse 11/17/2018 3:04 PM

## 2018-11-12 NOTE — Progress Notes (Signed)
The patient is a 48 year old female here for follow-up after undergoing a left latissimus muscle flap expander placement.  She is doing very well.  The flap looks nice and healthy.  She has minimal drainage.  I was able to remove the posterior superior drain.  I did not expand today.  We will plan on expanding and removing another drain next week.  She is pleased with her progress.

## 2018-11-14 NOTE — Progress Notes (Signed)
Mackenzie [name]  Telephone:([phone]) [phone] Fax:(336) [phone]    ID: Mackenzie [name] DOB: [birth year]  MR#: [id number]  WNI#:[id number]  Patient Care Team: Willey Blade, MD as PCP - General (Internal Medicine) Jack Bolio, [name], MD as Consulting Physician (Oncology) Erroll Luna, MD as Consulting Physician (General Surgery) Kyung Rudd, MD as Consulting Physician (Radiation Oncology) Dillingham, Loel Lofty, DO as Attending Physician (Plastic Surgery) Cathlean Cower, RN as Registered Nurse Barrington Ellison, RN as [facility] OTHER MD:   CHIEF COMPLAINT: Estrogen receptor positive breast cancer  CURRENT TREATMENT: letrozole, goserelin; on [name] trial (ribociclib arm)   INTERVAL HISTORY: Mackenzie [name] returns today for follow-up and treatment of her estrogen receptor positive breast cancer.  She is being treated with goserelin and letrozole and then for the trial she receives ribociclib  She is tolerating the letrozole with fair tolerance. She reports hot flashes, stating her temperature is "bipolar. I'm cold one minute and hot the next!" She denies issues with vaginal dryness.  She also receives Goserelin every 4 weeks, with her most recent dose received on 10/18/2018. She will receive a dose today. She tolerates this well with no noticeable side effects.  Finally, she also continues on ribociclib per the study, at 400 mg daily.. She also tolerates this well with no noticeable side effects.  Her left breast latissimus flap and replacement of tissue expander took place on 11/03/2018 under Dr. Marla Roe. She followed up with Dr. Marla Roe on 11/12/2018 and was doing well. She plans to get a saline implant. She is restricted in lifting and walking-- she can life a half gallon and walk a half mile.   REVIEW OF SYSTEMS: Mackenzie [name] reports a rash to her groin area.  She is having the usual amount of pain after the surgery she has had and she is  controlling it well with oxycodone.  This is not constipating her.  The patient denies unusual headaches, visual changes, nausea, vomiting, stiff neck, dizziness, or gait imbalance. There has been no cough, phlegm production, or pleurisy, no chest pain or pressure.. The patient denies fever, rash, bleeding, unexplained fatigue or unexplained weight loss. A detailed review of systems was otherwise entirely negative.   HISTORY OF CURRENT ILLNESS: From the original intake note:  The patient herself noted some changes in her left breast late July 2018, she says, and eventually brought this to medical attention so that on 01/09/2017 she underwent bilateral diagnostic mammography with tomography and left breast ultrasonography at the breast Center. This found the breast density to be category C. In the upper inner quadrant of the left breast there was an area of asymmetry and there were malignant type calcifications involving all 4 quadrants. On exam there is firmness and palpable thickening in the anterior left breast with skin dimpling. Ultrasonography found at the 9:30 o'clock radiant 3 cm from the nipple a 2.7 cm mass and in the left axilla for abnormal lymph nodes largest of which measured 2.7 cm.  Biopsy of the left breast 9:30 o'clock mass 80 01/26/2017 showed ([id number]) invasive ductal carcinoma, with extracellular mucin, grade 1 or 2. In the lower outer left breast is separated biopsy the same day showed ductal carcinoma in situ. One of the 4 lymph nodes involved was positive for metastatic carcinoma. Prognostic panel on the invasive disease showed it to be estrogen receptor 100% positive, progesterone receptor 20% positive, both with strong staining intensity, with an MIB-1 of 20%, and no HER-2 amplification with a signals  ratio 1.38 and the number per cell 2.90.  On 01/22/2017 the patient underwent bilateral breast MRI. This showed no involvement of the right breast, but in the left breast there  was a masslike and non-masslike enhancement involving all quadrants, with skin swelling but no abnormal enhancement of the skin or nipple areolar complex or pectoralis muscle. The mass could not be clearly measured but spanned approximately 12 cm. There was bulky left axillary lymphadenopathy, with the largest lymph node measuring up to 3.4 cm.  The patient's subsequent history is as detailed below.   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, left ([place]) 01/23/2017   Diabetes mellitus 2012   GDM   Hot flashes 03/2017   Hyperlipidemia 03/20/2016   Hypertension 2012   denies   Morbid obesity with body mass index (BMI) of 40.0 to 49.9 (HCC) 09/17/2017   NCGS (non-celiac gluten sensitivity)    Pre-diabetes    Vitamin D deficiency 05/2015    PAST SURGICAL HISTORY: Past Surgical History:  Procedure Laterality Date   BREAST RECONSTRUCTION WITH PLACEMENT OF TISSUE EXPANDER AND FLEX HD (ACELLULAR HYDRATED DERMIS) Left 08/27/2017   Procedure: LEFT BREAST RECONSTRUCTION WITH PLACEMENT OF TISSUE EXPANDER AND FLEX HD;  Surgeon: Wallace Going, DO;  Location: [facility];  Service: Plastics;  Laterality: Left;   CESAREAN SECTION     x2   LATISSIMUS FLAP TO BREAST Left 11/03/2018   LATISSIMUS FLAP TO BREAST Left 11/03/2018   Procedure: LATISSIMUS FLAP TO LEFT BREAST;  Surgeon: Wallace Going, DO;  Location: [facility];  Service: Plastics;  Laterality: Left;   MASTECTOMY MODIFIED RADICAL Left 08/27/2017   MASTECTOMY MODIFIED RADICAL Left 08/27/2017   Procedure: LEFT MODIFIED RADICAL MASTECTOMY;  Surgeon: Erroll Luna, MD;  Location: [facility];  Service: General;  Laterality: Left;   PORT-A-CATH REMOVAL Right  08/27/2017   Procedure: REMOVAL PORT-A-CATH RIGHT CHEST;  Surgeon: Erroll Luna, MD;  Location: [facility];  Service: General;  Laterality: Right;   PORTACATH PLACEMENT Right 01/28/2017   Procedure: INSERTION PORT-A-CATH WITH ULTRASOUND;  Surgeon: Erroll Luna, MD;  Location: [facility];  Service: General;  Laterality: Right;   TISSUE EXPANDER PLACEMENT Left 11/03/2018   Procedure: PLACEMENT OF TISSUE EXPANDER LEFT BREAST;  Surgeon: Wallace Going, DO;  Location: [facility];  Service: Plastics;  Laterality: Left;  Total case time is 3.5 hours   TUBAL LIGATION Bilateral 01/21/2011    FAMILY HISTORY Family History  Problem Relation Age of Onset   Breast cancer Paternal Grandmother 36       d.60s from breast cancer. Did not have treatment.   Other Mother        K.93 from complications of surgery to remove brain tumor   The patient's father still alive at age 69. The patient's mother died with a brain tumor which the patient says was "benign". She was 56. The patient has one brother, no sisters. The only breast cancer in the family is a paternal grandmother who died from breast cancer at an unknown age. There is no history of ovarian or prostate cancer in the family.   GYNECOLOGIC HISTORY:  No LMP recorded. (Menstrual status: Chemotherapy). Menarche age 65 and first live birth  age 88 she is [place] P4. The patient is still having regular periods as of August 2018   SOCIAL HISTORY: (Updated 05/31/2018) Mackenzie [name] works at [employer] on the fifth floor, [place]. She is separated from her husband (as of 05/2018), [name], who is a Building control surveyor. Daughter is married and lives in [place] and works as a Education administrator. Jalei's daughter, [name], lives in [place] and works in the police department. Daughters, Dunlap and Loma Rica are 19 and 6, living at home. The patient has a grandson she looks after. She attends a [employer].     ADVANCED DIRECTIVES: Not in  place   HEALTH MAINTENANCE: Social History   Tobacco Use   Smoking status: Never Smoker   Smokeless tobacco: Never Used  Substance Use Topics   Alcohol use: Yes    Comment: social   Drug use: No    Colonoscopy: Never  PAP: December 2017  Bone density: Never   No Known Allergies  Current Outpatient Medications  Medication Sig Dispense Refill   acetaminophen (TYLENOL) 500 MG tablet Take 1,000 mg by mouth every 6 (six) hours as needed (for pain/headaches.).      B Complex Vitamins (VITAMIN B COMPLEX PO) Take 1 capsule by mouth 2 (two) times daily.      budesonide-formoterol (SYMBICORT) 80-4.5 MCG/ACT inhaler Inhale 2 puffs into the lungs every morning.      Calcium Carb-Cholecalciferol (CALCIUM 600+D3 PO) Take 1 tablet by mouth 2 (two) times daily.      cetirizine (ZYRTEC) 10 MG tablet Take 10 mg by mouth daily.      Cholecalciferol (VITAMIN D-3) 5000 units TABS Take 5,000 Units by mouth 2 (two) times daily.      diazepam (VALIUM) 2 MG tablet Take 1 tablet (2 mg total) by mouth every 8 (eight) hours as needed for muscle spasms. 30 tablet 0   gabapentin (NEURONTIN) 300 MG capsule Take 1 capsule (300 mg total) by mouth at bedtime. 90 capsule 4   HYDROcodone-acetaminophen (NORCO) 5-325 MG tablet Take 1 tablet by mouth every 6 (six) hours as needed for moderate pain. 30 tablet 0   hydroxypropyl methylcellulose / hypromellose (ISOPTO TEARS / GONIOVISC) 2.5 % ophthalmic solution Place 1 drop into both eyes daily as needed for dry eyes.     hydrOXYzine (VISTARIL) 100 MG capsule Take 1 capsule (100 mg total) by mouth 3 (three) times daily as needed for itching. 20 capsule 0   ibuprofen (ADVIL,MOTRIN) 200 MG tablet Take 800 mg by mouth every 8 (eight) hours as needed for mild pain (for pain.).      ketoconazole (NIZORAL) 2 % cream Apply 1 application topically daily. 15 g 0   letrozole (FEMARA) 2.5 MG tablet Take 1 tablet (2.5 mg total) by mouth daily. 90 tablet 5    loratadine (CLARITIN) 10 MG tablet Take 1 tablet (10 mg total) by mouth daily. (Patient taking differently: Take 10 mg by mouth daily as needed for allergies. ) 90 tablet 2   Multiple Vitamin (MULTIVITAMIN WITH MINERALS) TABS tablet Take 1 tablet by mouth daily.      Omega-3 Fatty Acids (FISH OIL) 1000 MG CAPS Take 1,000 mg by mouth 2 (two) times daily.      phenylephrine (SUDAFED PE) 10 MG TABS tablet Take 10 mg by mouth every 4 (four) hours as needed.     Probiotic Product (PROBIOTIC DAILY PO) Take 1 capsule by mouth 2 (two) times daily.      prochlorperazine (COMPAZINE) 10 MG tablet Take 1  tablet (10 mg total) by mouth every 6 (six) hours as needed for nausea or vomiting. 30 tablet 0   Turmeric 450 MG CAPS Take 450 mg by mouth 2 (two) times daily.     ZINC OXIDE PO Take 1 tablet by mouth 2 (two) times a day.     No current facility-administered medications for this visit.    Facility-Administered Medications Ordered in Other Visits  Medication Dose Route Frequency Provider Last Rate Last Dose   sodium chloride flush (NS) 0.9 % injection 10 mL  10 mL Intravenous PRN Amayiah Gosnell, [name], MD   10 mL at 03/10/17 1239    OBJECTIVE: Middle-aged African-American [name] in no acute distress Vitals:   11/15/18 1015  BP: 130/64  Pulse: 81  Resp: 17  Temp: 98.5 F (36.9 C)  SpO2: 100%     Body mass index is 42.79 kg/m.   Wt Readings from Last 3 Encounters:  11/15/18 265 lb 1.6 oz (120.2 kg)  11/12/18 272 lb 6.4 oz (123.6 kg)  11/03/18 261 lb 1.6 oz (118.4 kg)   ECOG FS: 1 - Symptomatic but completely ambulatory  Sclerae unicteric, EOMs intact Wearing a mask No cervical or supraclavicular adenopathy Lungs no rales or rhonchi Heart regular rate and rhythm Abd soft, nontender, positive bowel sounds MSK no focal spinal tenderness, no upper extremity lymphedema Neuro: nonfocal, well oriented, appropriate affect Breasts: The right breast is unremarkable.  The left breast is status  post mastectomy with latissimus flap and spanned her reconstruction.  The incisions are healing nicely.  2 drains are still in place with scant fluid in the bulb.  There is no dehiscence, erythema, or swelling.  Both axillae are benign.  LAB RESULTS:  No visits with results within 3 Day(s) from this visit.  Latest known visit with results is:  Appointment on 11/12/2018  Component Date Value Ref Range Status   WBC Count 11/12/2018 5.9  4.0 - 10.5 K/uL Final   RBC 11/12/2018 3.79* 3.87 - 5.11 MIL/uL Final   Hemoglobin 11/12/2018 10.7* 12.0 - 15.0 g/dL Final   HCT 11/12/2018 34.1* 36.0 - 46.0 % Final   MCV 11/12/2018 90.0  80.0 - 100.0 fL Final   MCH 11/12/2018 28.2  26.0 - 34.0 pg Final   MCHC 11/12/2018 31.4  30.0 - 36.0 g/dL Final   RDW 11/12/2018 14.1  11.5 - 15.5 % Final   Platelet Count 11/12/2018 266  150 - 400 K/uL Final   nRBC 11/12/2018 0.0  0.0 - 0.2 % Final   Neutrophils Relative % 11/12/2018 66  % Final   Neutro Abs 11/12/2018 4.0  1.7 - 7.7 K/uL Final   Lymphocytes Relative 11/12/2018 20  % Final   Lymphs Abs 11/12/2018 1.2  0.7 - 4.0 K/uL Final   Monocytes Relative 11/12/2018 10  % Final   Monocytes Absolute 11/12/2018 0.6  0.1 - 1.0 K/uL Final   Eosinophils Relative 11/12/2018 2  % Final   Eosinophils Absolute 11/12/2018 0.1  0.0 - 0.5 K/uL Final   Basophils Relative 11/12/2018 1  % Final   Basophils Absolute 11/12/2018 0.0  0.0 - 0.1 K/uL Final   Immature Granulocytes 11/12/2018 1  % Final   Abs Immature Granulocytes 11/12/2018 0.04  0.00 - 0.07 K/uL Final   Performed at [facility], [place] [address], [place], [place] [address]   Sodium 11/12/2018 137  135 - 145 mmol/L Final   Potassium 11/12/2018 4.1  3.5 - 5.1 mmol/L Final  Chloride 11/12/2018 102  98 - 111 mmol/L Final   CO2 11/12/2018 27  22 - 32 mmol/L Final   Glucose, Bld 11/12/2018 109* 70 - 99 mg/dL Final   BUN 11/12/2018 14  6 - 20 mg/dL Final    Creatinine 11/12/2018 0.83  0.44 - 1.00 mg/dL Final   Calcium 11/12/2018 9.2  8.9 - 10.3 mg/dL Final   Total Protein 11/12/2018 7.2  6.5 - 8.1 g/dL Final   Albumin 11/12/2018 3.4* 3.5 - 5.0 g/dL Final   AST 11/12/2018 19  15 - 41 U/L Final   ALT 11/12/2018 30  0 - 44 U/L Final   Alkaline Phosphatase 11/12/2018 126  38 - 126 U/L Final   Total Bilirubin 11/12/2018 0.3  0.3 - 1.2 mg/dL Final   GFR, Est Non Af Am 11/12/2018 >60  >60 mL/min Final   GFR, Est AFR Am 11/12/2018 >60  >60 mL/min Final   Anion gap 11/12/2018 8  5 - 15 Final   Performed at [facility], [place] [address] [place], [place] [address]   Magnesium 11/12/2018 1.7  1.7 - 2.4 mg/dL Final   Performed at [facility], [place] [address], [place], [place] [address]   Phosphorus 11/12/2018 4.5  2.5 - 4.6 mg/dL Final   Performed at [place] [address] [place], [place] [address]   Uric Acid, Serum 11/12/2018 3.5  2.5 - 7.1 mg/dL Final   Performed at [facility], [place] [address], [place], [place] [address]   Bilirubin, Direct 11/12/2018 0.1  0.0 - 0.2 mg/dL Final   Performed at [place] [address], [place], [place] [address]   LDH 11/12/2018 151  98 - 192 U/L Final   Performed at [facility], [place] [address], [place], [place] [address]   Amylase 11/12/2018 64  28 - 100 U/L Final   Performed at [facility], [place] [address] [place], [place] [address]   Lipase 11/12/2018 22  11 - 51 U/L Final   Performed at [facility], [place] [address] [place], [place] [address]   GGT 11/12/2018 31  7 - 50 U/L Final   Performed at [facility] [address], [place], [place] [address]     STUDIES: No results found.   ELIGIBLE FOR AVAILABLE RESEARCH PROTOCOL: [name], ASA study   ASSESSMENT: 48 y.o. Mackenzie [name] status post  left breast upper inner quadrant biopsy 01/14/2017 for a clinical T3 N2, stage IIA invasive ductal carcinoma, grade 1 or 2, estrogen and progesterone receptor positive, HER-2 nonamplified, with an MIB-1 of 20%.  (1) staging studies: Brain MRI, bone scan, and CT scan of the chest 02/05/2017 showed no brain lesions, no lung or liver lesions, a 4.9 cm mass in the left breast with left axillary and subpectoral adenopathy, and nonspecific bone scan tracer at L2, left scapula, and anterior ribs, with lumbar spine MRI suggested for further evaluation.  (a) lumbar spine MRI 02/17/2017 showed no normal bone lesions.  There was mild lumbar spondylosis  (2) neoadjuvant chemotherapy consisting of cyclophosphamide and doxorubicin in dose dense fashion 4 started 02/10/2017, completed 03/24/2017, followed by weekly carboplatin and gemcitabine given days 1 and 8 of each 21-day cycle starting 04/14/2017, completing the planned 4 cycles 06/26/2017  (3) is post left modified radical mastectomy on 08/27/2017 showing an mpT3 pN2 residual invasive ductal carcinoma, grade 2, with a residual cancer  burden of 3.  Margins were clear  (a) latissimus flap reconstruction with expander 11/03/2018  (4) postmastectomy radiation completed 01/01/2018  (a) capecitabine radiosensitization 11/16/2017-01/01/2018  (5) goserelin started 09/21/2017  (a) letrozole started 01/11/2018  (b) enrolled in [place] clinical trial, randomized to ribociclib on 05/31/2018  (c) Bone density on 03/16/2018 was normal with T score of -0.7 in the L1-L4 spine   (6) genetics testing 03/24/2017 through the Common Hereditary Cancer Panel offered by [facility] found no deleterious mutations in APC, ATM, AXIN2, BARD1, BMPR1A, BRCA1, BRCA2, BRIP1, CDH1, CDKN2A (p14ARF), CDKN2A (p16INK4a), CHEK2, CTNNA1, DICER1, EPCAM (Deletion/duplication testing only), GREM1 (promoter region deletion/duplication testing only), KIT, MEN1, MLH1, MSH2, MSH3, MSH6, MUTYH, NBN, NF1,  NHTL1, PALB2, PDGFRA, PMS2, POLD1, POLE, PTEN, RAD50, RAD51C, RAD51D, SDHB, SDHC, SDHD, SMAD4, SMARCA4. STK11, TP53, TSC1, TSC2, and VHL.  The following genes were evaluated for sequence changes only: SDHA and HOXB13 c.251G>A variant only.     PLAN: Mackenzie [name] is now just over a year out from definitive surgery for her breast cancer.  There is no evidence of disease activity.  This is favorable.  She is tolerating the letrozole and ribociclib well.  The plan is to continue the letrozole for a total of 5 years.  She is proceeding with reconstruction and just had her latissimus flap which she tolerated well.  She will have the final implant placed sometime later this year  We are continuing goserelin which she is also tolerating without event.  I have encouraged her to walk the half mile that she is allowed right now and to increase it as soon as her post reconstruction restrictions eases up.  I have written for ketoconazole cream for her pelvic rash.  She will let me know if it does not work for her.  Otherwise she will see me again late August.  She knows to call for any other issues that may develop before that visit.   Mackenzie [name], [name], MD  11/15/18 11:07 AM Medical Oncology and Hematology [facility] [address] [place], [place] [address] Tel. [phone]    Fax. [phone]   I, Wilburn Mylar, am acting as scribe for Dr. [name]. Mackenzie [name].  I, Lurline Del MD, have reviewed the above documentation for accuracy and completeness, and I agree with the above.

## 2018-11-15 ENCOUNTER — Inpatient Hospital Stay: Payer: 59

## 2018-11-15 ENCOUNTER — Inpatient Hospital Stay (HOSPITAL_BASED_OUTPATIENT_CLINIC_OR_DEPARTMENT_OTHER): Payer: 59 | Admitting: Oncology

## 2018-11-15 ENCOUNTER — Encounter: Payer: Self-pay | Admitting: *Deleted

## 2018-11-15 ENCOUNTER — Telehealth: Payer: Self-pay | Admitting: *Deleted

## 2018-11-15 ENCOUNTER — Other Ambulatory Visit: Payer: Self-pay

## 2018-11-15 VITALS — BP 130/64 | HR 81 | Temp 98.5°F | Resp 17 | Ht 66.0 in | Wt 265.1 lb

## 2018-11-15 DIAGNOSIS — Z923 Personal history of irradiation: Secondary | ICD-10-CM

## 2018-11-15 DIAGNOSIS — Z9221 Personal history of antineoplastic chemotherapy: Secondary | ICD-10-CM | POA: Diagnosis not present

## 2018-11-15 DIAGNOSIS — C50912 Malignant neoplasm of unspecified site of left female breast: Secondary | ICD-10-CM

## 2018-11-15 DIAGNOSIS — Z170 Estrogen receptor positive status [ER+]: Secondary | ICD-10-CM

## 2018-11-15 DIAGNOSIS — Z79811 Long term (current) use of aromatase inhibitors: Secondary | ICD-10-CM

## 2018-11-15 DIAGNOSIS — C50212 Malignant neoplasm of upper-inner quadrant of left female breast: Secondary | ICD-10-CM | POA: Diagnosis not present

## 2018-11-15 DIAGNOSIS — Z006 Encounter for examination for normal comparison and control in clinical research program: Secondary | ICD-10-CM

## 2018-11-15 DIAGNOSIS — C773 Secondary and unspecified malignant neoplasm of axilla and upper limb lymph nodes: Secondary | ICD-10-CM

## 2018-11-15 DIAGNOSIS — C50812 Malignant neoplasm of overlapping sites of left female breast: Secondary | ICD-10-CM

## 2018-11-15 DIAGNOSIS — Z5111 Encounter for antineoplastic chemotherapy: Secondary | ICD-10-CM | POA: Diagnosis not present

## 2018-11-15 DIAGNOSIS — Z9012 Acquired absence of left breast and nipple: Secondary | ICD-10-CM | POA: Diagnosis not present

## 2018-11-15 DIAGNOSIS — R21 Rash and other nonspecific skin eruption: Secondary | ICD-10-CM | POA: Diagnosis not present

## 2018-11-15 MED ORDER — KETOCONAZOLE 2 % EX CREA: 1.0000 "application " | TOPICAL_CREAM | Freq: Every day | CUTANEOUS | 0 refills | Status: DC

## 2018-11-15 MED ORDER — INV-RIBOCICLIB 200 MG TAB NATALEE TRIO003 STUDY: 400.0000 mg | ORAL_TABLET | Freq: Every day | ORAL | 0 refills | Status: DC

## 2018-11-15 MED ORDER — GOSERELIN ACETATE 3.6 MG ~~LOC~~ IMPL
3.6000 mg | DRUG_IMPLANT | Freq: Once | SUBCUTANEOUS | Status: AC
  Administered 2018-11-15: 3.6 mg via SUBCUTANEOUS

## 2018-11-15 MED ORDER — GOSERELIN ACETATE 3.6 MG ~~LOC~~ IMPL
DRUG_IMPLANT | SUBCUTANEOUS | Status: AC
  Filled 2018-11-15: qty 3.6

## 2018-11-15 MED ORDER — PROCHLORPERAZINE MALEATE 10 MG PO TABS: 10.0000 mg | ORAL_TABLET | Freq: Four times a day (QID) | ORAL | 0 refills | Status: DC | PRN

## 2018-11-15 MED FILL — PROCHLORPERAZINE 10 MG TAB: 10 | 8 days supply | Qty: 30 | Fill #0

## 2018-11-15 MED FILL — KETOCONAZOLE 2% CREAM: 2 | 10 days supply | Qty: 15 | Fill #0

## 2018-11-15 NOTE — Research (Signed)
11/15/2018 Research - [employer] [id number]/[name] - Cycle 7, Day 1   Patient into clinic unaccompanied this morning for continued evaluation prior to beginning treatment Cycle 7 today.  Vitals - Vital signs were taken after the patient had been seated approximately five minutes.    Adverse Events - Previously assessed by research nurse, Foye Spurling on 11/12/2018. Patient reports yeast in groin area, but no vaginal infection, which she attributes to perioperative antibiotics. Patient denies onset of any additional new symptoms.  Concomitant Medications - No changes to medications since last assessed on 11/12/2018. Patient is aware that Phenergan for nausea should be avoided if possible, due to potential drug interaction resulting in prolonged QTc interval. Prescription for preferred medication, Compazine, prescribed by Dr. Jana Hakim. Due to potential drug interactions with oral antifungal medications, MD to prescribe topical agent for treatment of yeast in groin area.   Physical Exam/PS/Clinical Evaluation for Recurrence (STEEP) - Please see documentation in today's office visit encounter with Dr. Jana Hakim. Based on lab results review and history and physical exam by Dr. Jana Hakim, patient condition was deemed acceptable for continued treatment with the next treatment cycle beginning today.   Visit Scheduling - Patient and MD are aware that the next scheduled visit will occur at Cycle 10, and that sufficient medication for the next 3 cycles will be dispensed today. Patient and MD verbally acknowledged understanding of the plan. Patient is aware that she should contact the clinic at any time if any symptoms worsen or recur, or if she has any questions or concerns.  Investigational Medication - Completed Cycle 6 medication diary was returned by patient showing doses taken days 1-8 as prescribed, followed by break in therapy due to elective surgical procedures. Cycle 6 study medication bottle was returned to  pharmacy for accounting by pharmacist Raul Del. Pill count by pharmacist revealed 59 tablets remaining, indicating no missed doses during Days 1-8. Investigational drug (x 3 bottles, to cover cycles 7 through 9), was dispensed by pharmacist Raul Del today continuing at the full dose of 400 daily. Patient was provided with 3 copies of the Patient Diary for recording medication doses (letrozole and ribociclib), during cycles 7, 8 and 9. Patient proceeded to the injection area where she self-administered ribociclib and letrozole under the supervision of the research nurse, per protocol. Patient filled in diary dosing for today with time of 11:24am. Patient understands that once a bottle is opened it should not be used after 30 days. Patient was instructed to open a new bottle with each cycle as marked, filling in the date opened, and to retain the partial bottles for return at her next clinic visit.   Cindy S. Brigitte Pulse BSN, RN, [place] 11/15/2018 11:53 AM

## 2018-11-15 NOTE — Telephone Encounter (Signed)
Received return phone call from patient stating that she just arrived at clinic as a result of a couple of delays this morning. Cindy S. Brigitte Pulse BSN, RN, [place] 11/15/2018 9:32 AM

## 2018-11-15 NOTE — Telephone Encounter (Signed)
11/15/2018 Left voice mail message for patient regarding 9am appointment today. Requested that she contact me at [phone]. Cindy S. Brigitte Pulse BSN, RN, CCRP 11/15/2018 9:30 AM

## 2018-11-16 ENCOUNTER — Other Ambulatory Visit: Payer: Self-pay | Admitting: *Deleted

## 2018-11-16 ENCOUNTER — Ambulatory Visit: Payer: Self-pay | Admitting: *Deleted

## 2018-11-16 ENCOUNTER — Telehealth: Payer: Self-pay | Admitting: Oncology

## 2018-11-16 NOTE — Patient Outreach (Signed)
[facility]) Care Management  11/16/2018  Mackenzie [name] 1970/07/22 [id number]   Transition of care telephone call  Referral received:11/04/18 Initial outreach:11/08/18 Insurance: [place] unsuccessful attempt to reach patient on her preferred(mobile)number in order to complete transition of care assessment; no answer, left HIPAA compliant voicemail message requesting return call.   Objective: Per the electronic medical record,[facility]ack[facility] to left breast.Mackenzie [name] was diagnosed with invasive ductal breast cancer on 01/14/17, she underwent chemotherapy and radiation and had a left modified radical mastectomy on 08/28/27.Comorbidities include: HTN, and morbid obesity. She was discharged to home on5/29/20 without the need for home health services or durable medical equipment per the discharge summary.  Per notes in Epic, Decline followed up with her surgeon on 11/12/18 and the note states she is recovering well from her latissimus muscle flap and Dr. Marla Roe removed the posterior superior drain. She will see Dr. Marla Roe again on 11/19/18. Mackenzie [name] also followed up with her oncologist on 11/15/18 and received a Goserelin infusion for treatment of her breast cancer.  Plan: If no return call from patient, will close case to [facility] services in 10 business days after initial post hospital discharge outreach, on 11/19/18.  Barrington Ellison RN,CCM,CDE [facility] Management Coordinator Office Phone [phone] Office Fax [phone]

## 2018-11-16 NOTE — Telephone Encounter (Signed)
I left a message regarding schedule will mail 

## 2018-11-18 ENCOUNTER — Telehealth: Payer: Self-pay | Admitting: Plastic Surgery

## 2018-11-18 NOTE — Telephone Encounter (Signed)

## 2018-11-19 ENCOUNTER — Encounter: Payer: Self-pay | Admitting: Plastic Surgery

## 2018-11-19 ENCOUNTER — Other Ambulatory Visit: Payer: Self-pay

## 2018-11-19 ENCOUNTER — Ambulatory Visit (INDEPENDENT_AMBULATORY_CARE_PROVIDER_SITE_OTHER): Payer: 59 | Admitting: Plastic Surgery

## 2018-11-19 ENCOUNTER — Other Ambulatory Visit: Payer: Self-pay | Admitting: *Deleted

## 2018-11-19 VITALS — BP 136/81 | HR 89 | Temp 98.2°F | Ht 66.0 in | Wt 266.8 lb

## 2018-11-19 DIAGNOSIS — C50212 Malignant neoplasm of upper-inner quadrant of left female breast: Secondary | ICD-10-CM

## 2018-11-19 DIAGNOSIS — Z170 Estrogen receptor positive status [ER+]: Secondary | ICD-10-CM

## 2018-11-19 DIAGNOSIS — Z9012 Acquired absence of left breast and nipple: Secondary | ICD-10-CM

## 2018-11-19 NOTE — Progress Notes (Signed)
   Subjective:    Patient ID: Mackenzie [name], female    DOB: [birth year], 48 y.o.   MRN: [id number]  Patient is a 48 year old female here with her daughter and daughter for follow-up on her breast reconstruction.  She is doing quite well.  She had a latissimus on the left.  Drain output is overall minimal and within normal limits.  The incisions are all healing very nicely.  The flap has excellent capillary refill.  There is no sign of infection.   Review of Systems  Constitutional: Negative for activity change and appetite change.  Eyes: Negative.   Respiratory: Negative for chest tightness and shortness of breath.   Cardiovascular: Negative for leg swelling.  Gastrointestinal: Negative for abdominal pain.  Genitourinary: Negative.   Musculoskeletal: Negative for back pain.  Psychiatric/Behavioral: Negative.        Objective:   Physical Exam Vitals signs and nursing note reviewed.  Constitutional:      Appearance: Normal appearance.  HENT:     Head: Normocephalic and atraumatic.  Cardiovascular:     Rate and Rhythm: Normal rate.  Pulmonary:     Effort: Pulmonary effort is normal. No respiratory distress.     Breath sounds: No wheezing.  Abdominal:     General: Abdomen is flat.  Neurological:     Mental Status: She is alert. Mental status is at baseline.  Psychiatric:        Mood and Affect: Mood normal.        Behavior: Behavior normal.          Assessment & Plan:     ICD-10-CM   1. Malignant neoplasm of upper-inner quadrant of left breast in female, estrogen receptor positive ([place])  C50.212    Z17.0   2. Acquired absence of left breast  Z90.12     We placed injectable saline in the Expander using a sterile technique: Left: 50 cc for a total of 300 / 455 cc The left breast drain was removed.

## 2018-11-19 NOTE — Patient Outreach (Signed)
[name]) Care Management  11/19/2018  Mackenzie [name] [birth year] [id number]  Transition of Care Case Closure- Unsuccessful Outreach Insurance: [place] Choice Plan Referral received: 11/04/18 Initial outreach attempt: 11/08/18  Objective: Per the electronic medical record,Mackenzie Johnsonwas hospitalized [facility] from[facility]/27-5/29/30forlatissimus flap to left breast.Mackenzie [name] was diagnosed with invasive ductal breast cancer on 01/14/17, she underwent chemotherapy and radiation and had a left modified radical mastectomy on 08/28/27.Comorbidities include: HTN, and morbid obesity. She was discharged to home on5/29/20 without the need for home health services or durable medical equipment per the discharge summary.  Per notes in Epic, Decline followed up with her surgeon on 11/12/18 and the note states she is recovering well from her latissimus muscle flap and Dr. Marla Roe removed the posterior superior drain. She will see Dr. Marla Roe again on 11/19/18. Mackenzie [name] also followed up with her oncologist on 11/15/18 and received a Goserelin infusion for treatment of her breast cancer.  Assessment: Unable to complete post hospital discharge transition of care assessment; no return call from patient after 3 attempts and no response to request to contact this RNCM in unsuccessful outreach letter mailed to patient's home on 11/08/18.   Plan: Case closed to [place] Management services as it has been 10 business days since initial post hospital discharge outreach attempt.  Barrington Ellison RN,CCM,CDE [place] Management Coordinator Office Phone [phone] Office Fax [phone]

## 2018-11-22 ENCOUNTER — Telehealth (HOSPITAL_COMMUNITY): Payer: Self-pay | Admitting: Radiology

## 2018-11-22 NOTE — Telephone Encounter (Signed)
Left message to call office-Patient needs to schedule an echocardiogram.  

## 2018-11-23 ENCOUNTER — Telehealth: Payer: Self-pay | Admitting: *Deleted

## 2018-11-23 ENCOUNTER — Encounter: Payer: Self-pay | Admitting: *Deleted

## 2018-11-23 NOTE — Telephone Encounter (Signed)
Called patient to clarify a medication, Isopto Tears eye drops, that was added to her medication list prior to surgery.  Patient states this is OTC eye drops she is using as needed for dry and itchy eyes which she thinks is due to her seasonal allergies. She started using at the beginning of May.  Thanked patient for her time.  Reviewed next appointment and patient asks for lab to be at 9 am as usual instead of 11 am as scheduled since she will be fasting. Informed patient I will request the time change and encouraged patient to call if any questions/ concerns before next contact. She verbalized understanding.   Foye Spurling, BSN, RN Clinical Research Nurse 11/23/2018 1:42 PM

## 2018-11-26 ENCOUNTER — Telehealth: Payer: Self-pay | Admitting: Oncology

## 2018-11-26 ENCOUNTER — Ambulatory Visit (INDEPENDENT_AMBULATORY_CARE_PROVIDER_SITE_OTHER): Payer: 59 | Admitting: Plastic Surgery

## 2018-11-26 ENCOUNTER — Other Ambulatory Visit: Payer: Self-pay

## 2018-11-26 ENCOUNTER — Ambulatory Visit: Payer: 59 | Admitting: Plastic Surgery

## 2018-11-26 VITALS — BP 125/84 | HR 72 | Temp 98.1°F | Ht 66.0 in | Wt 267.0 lb

## 2018-11-26 DIAGNOSIS — C50812 Malignant neoplasm of overlapping sites of left female breast: Secondary | ICD-10-CM

## 2018-11-26 DIAGNOSIS — Z9012 Acquired absence of left breast and nipple: Secondary | ICD-10-CM

## 2018-11-26 DIAGNOSIS — Z170 Estrogen receptor positive status [ER+]: Secondary | ICD-10-CM

## 2018-11-26 NOTE — Telephone Encounter (Signed)
Per 6/16 schedule message adjusted 8/28 lab time to 9:15 am. Left message for patient. Patient to get updated schedule at next visit 7/6.

## 2018-11-27 ENCOUNTER — Encounter: Payer: Self-pay | Admitting: Plastic Surgery

## 2018-11-27 NOTE — Progress Notes (Signed)
   Subjective:    Patient ID: Mackenzie [name], female    DOB: April 04, 1971, 48 y.o.   MRN: [id number]  The patient is a 48 year old female who is here for follow-up on her left breast reconstruction.  She has a latissimus muscle flap in place.  He has excellent capillary refill blood flow and the incisions are healing very nicely.  The back incision is healing well.  No sign of infection or fluid collection in any site.   Review of Systems  Constitutional: Negative for activity change and appetite change.  Respiratory: Negative for chest tightness and shortness of breath.   Musculoskeletal: Negative for back pain.  Skin: Negative for color change and wound.      Objective:   Physical Exam Vitals signs and nursing note reviewed.  Constitutional:      Appearance: Normal appearance.  HENT:     Head: Normocephalic and atraumatic.  Cardiovascular:     Rate and Rhythm: Normal rate.  Pulmonary:     Effort: Pulmonary effort is normal.  Abdominal:     General: Abdomen is flat.  Skin:    General: Skin is warm.  Neurological:     General: No focal deficit present.     Mental Status: She is alert.  Psychiatric:        Mood and Affect: Mood normal.        Thought Content: Thought content normal.        Judgment: Judgment normal.        Assessment & Plan:     ICD-10-CM   1. Malignant 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, estrogen receptor positive ([place])  C50.812    Z17.0   2. Acquired absence of left breast  Z90.12     We placed injectable saline in the Expander using a sterile technique: Left: 50 cc for a total of 350 / 455 cc  Follow up in two weeks.

## 2018-11-30 ENCOUNTER — Other Ambulatory Visit: Payer: Self-pay | Admitting: *Deleted

## 2018-12-02 ENCOUNTER — Other Ambulatory Visit: Payer: Self-pay | Admitting: *Deleted

## 2018-12-02 DIAGNOSIS — C50212 Malignant neoplasm of upper-inner quadrant of left female breast: Secondary | ICD-10-CM

## 2018-12-06 ENCOUNTER — Telehealth: Payer: Self-pay | Admitting: *Deleted

## 2018-12-06 ENCOUNTER — Telehealth: Payer: Self-pay | Admitting: Plastic Surgery

## 2018-12-06 NOTE — Telephone Encounter (Signed)
Received call from the patient stating that yesterday afternoon she noticed drainage round the insertion site,and she also could hear air coming from the tube. She said she's not sure if the tube from the drain has come out.  She said she took pictures of the area and sent them to a friend that is a Marine scientist and she told her it looked like the tube was coming out.  Asked the patient if the area was red and swollen, and if she has a fever.  She stated that the area was a little red and swollen, but she does not have a fever.  Asked if the site was oozing and she said yes she could feel it running down her back.  She said she placed 4x4 gauze on the site then placed another gauze on top of the 4x4.   Asked the patient if the tube was completely out and she stated no.  Asked when was her next appointment and she stated she has an appointment on tomorrow with Dr. Marla Roe.  Informed the patient that I will contact Dr. Marla Roe regarding the message and give her a call back.  Patient verbalized understanding and agreed.//AB/CMA

## 2018-12-06 NOTE — Telephone Encounter (Signed)

## 2018-12-06 NOTE — Telephone Encounter (Signed)
Patient called back to let me know that the tubing has come completely out. Informed her that I will contact Dr. Marla Roe to see what she would like for her to do.  Patient verbalized understanding and agreed.//AB/CMA

## 2018-12-06 NOTE — Telephone Encounter (Signed)
Called the patient back and informed her that I spoke with Bonita,RN and informed her of the message.  Per Bonita,RN she can use vaseline over the area as a barrier and to seal the area.  And gauze.  Patient verbalized understanding and agreed.//AB/CMA

## 2018-12-07 ENCOUNTER — Other Ambulatory Visit: Payer: Self-pay

## 2018-12-07 ENCOUNTER — Encounter: Payer: Self-pay | Admitting: Surgical

## 2018-12-07 ENCOUNTER — Ambulatory Visit (INDEPENDENT_AMBULATORY_CARE_PROVIDER_SITE_OTHER): Payer: 59 | Admitting: Surgical

## 2018-12-07 VITALS — BP 139/84 | HR 95 | Temp 98.4°F | Ht 66.0 in | Wt 266.8 lb

## 2018-12-07 DIAGNOSIS — C50212 Malignant neoplasm of upper-inner quadrant of left female breast: Secondary | ICD-10-CM

## 2018-12-07 DIAGNOSIS — Z9012 Acquired absence of left breast and nipple: Secondary | ICD-10-CM

## 2018-12-07 DIAGNOSIS — Z170 Estrogen receptor positive status [ER+]: Secondary | ICD-10-CM

## 2018-12-07 NOTE — Progress Notes (Signed)
     Patient ID: Mackenzie [name], female    DOB: [birth year], 48 y.o.   MRN: [id number]   Chief Complaint  Patient presents with  . Follow-up    1 week    HPI Mackenzie [name] is a 48 y.o. female who is here for follow-up after left breast reconstruction she has a latissimus flap in place.  The flap appears to be healing very nicely with good blood flow.  She reported over the weekend that her left latissimus drain site had fallen out.  The drain site appears noninfected and to be closing.  She has no signs of infection. She did have some signs of fluid collection where the drain site had been. She did not have any pain after her last fill on 11/26/2018 and would like to have another refill today.  Review of Systems  Constitutional: Negative for chills, diaphoresis, fatigue and fever.  Respiratory: Negative for apnea, cough, choking and chest tightness.   Cardiovascular: Negative for chest pain and palpitations.  Gastrointestinal: Negative for abdominal distention, diarrhea and nausea.  Skin: Positive for wound (drain site).  Neurological: Negative.      Objective:   Vitals:   12/07/18 1215  BP: 139/84  Pulse: 95  Temp: 98.4 F (36.9 C)  SpO2: 100%    Physical Exam  General: alert, calm, no acute distress HEENT:negative Neck: normal rom Chest: symmetrical rise and fall Cardiovascular: +2 bilateral radial pulse Pulmonary: unlabored breathing Breast: Left latissimus flap incisions healing well, good color and capillary refill. Abdomen: soft, non tender. Musculoskeletal: MAEx4 Neuro: A&O x3, calm, cooperative, steady gait Skin: latissimus incisions healing well, drain site on L back with shallow wound.   Assessment & Plan:     ICD-10-CM   1. Malignant neoplasm of upper-inner quadrant of left breast in female, estrogen receptor positive ([place])  C50.212    Z17.0   2. Acquired absence of left breast  Z90.12     We placed injectable saline in the Expander using a sterile  technique: Left: 50 cc for a total of 400 / 455 cc  Her left posterior drain had fallen out over the weekend, the site has closed and we placed Vaseline with gauze over the site.  There appears to be some fluid collection underneath where the drain had been removed, tried to aspirate but was unable to remove any fluid.  Patient informed to call us if she notices any extra fluid collection or has any pain.  She is doing well, follow-up in 2 week.    No follow-ups on file.  Mackenzie Rhine Barbaraann Avans, PA-C

## 2018-12-08 DIAGNOSIS — E559 Vitamin D deficiency, unspecified: Secondary | ICD-10-CM | POA: Diagnosis not present

## 2018-12-08 DIAGNOSIS — E669 Obesity, unspecified: Secondary | ICD-10-CM | POA: Diagnosis not present

## 2018-12-08 DIAGNOSIS — E785 Hyperlipidemia, unspecified: Secondary | ICD-10-CM | POA: Diagnosis not present

## 2018-12-08 DIAGNOSIS — R7309 Other abnormal glucose: Secondary | ICD-10-CM | POA: Diagnosis not present

## 2018-12-08 DIAGNOSIS — C50919 Malignant neoplasm of unspecified site of unspecified female breast: Secondary | ICD-10-CM | POA: Diagnosis not present

## 2018-12-08 DIAGNOSIS — Z0000 Encounter for general adult medical examination without abnormal findings: Secondary | ICD-10-CM | POA: Diagnosis not present

## 2018-12-13 ENCOUNTER — Inpatient Hospital Stay: Payer: 59 | Attending: Adult Health

## 2018-12-13 ENCOUNTER — Other Ambulatory Visit: Payer: Self-pay

## 2018-12-13 VITALS — BP 118/55 | HR 71 | Temp 98.0°F | Resp 18

## 2018-12-13 DIAGNOSIS — C773 Secondary and unspecified malignant neoplasm of axilla and upper limb lymph nodes: Secondary | ICD-10-CM | POA: Insufficient documentation

## 2018-12-13 DIAGNOSIS — Z170 Estrogen receptor positive status [ER+]: Secondary | ICD-10-CM | POA: Diagnosis not present

## 2018-12-13 DIAGNOSIS — C50212 Malignant neoplasm of upper-inner quadrant of left female breast: Secondary | ICD-10-CM | POA: Diagnosis not present

## 2018-12-13 DIAGNOSIS — Z5111 Encounter for antineoplastic chemotherapy: Secondary | ICD-10-CM | POA: Insufficient documentation

## 2018-12-13 DIAGNOSIS — C50812 Malignant neoplasm of overlapping sites of left female breast: Secondary | ICD-10-CM

## 2018-12-13 MED ORDER — GOSERELIN ACETATE 3.6 MG ~~LOC~~ IMPL
3.6000 mg | DRUG_IMPLANT | Freq: Once | SUBCUTANEOUS | Status: AC
  Administered 2018-12-13: 3.6 mg via SUBCUTANEOUS

## 2018-12-13 MED ORDER — GOSERELIN ACETATE 3.6 MG ~~LOC~~ IMPL
DRUG_IMPLANT | SUBCUTANEOUS | Status: AC
  Filled 2018-12-13: qty 3.6

## 2018-12-13 NOTE — Patient Instructions (Signed)

## 2018-12-16 ENCOUNTER — Encounter (HOSPITAL_COMMUNITY): Payer: Self-pay | Admitting: Cardiology

## 2018-12-17 MED FILL — LETROZOLE 2.5 MG TABS: 2.5 | 30 days supply | Qty: 30 | Fill #1

## 2018-12-17 MED FILL — GABAPENTIN 300 MG CAPSULE: 300 | 90 days supply | Qty: 90 | Fill #0

## 2018-12-21 ENCOUNTER — Encounter: Payer: Self-pay | Admitting: Plastic Surgery

## 2018-12-21 ENCOUNTER — Other Ambulatory Visit: Payer: Self-pay

## 2018-12-21 ENCOUNTER — Other Ambulatory Visit: Payer: Self-pay | Admitting: *Deleted

## 2018-12-21 ENCOUNTER — Ambulatory Visit (INDEPENDENT_AMBULATORY_CARE_PROVIDER_SITE_OTHER): Payer: 59 | Admitting: Plastic Surgery

## 2018-12-21 VITALS — BP 153/91 | HR 86 | Temp 97.7°F | Ht 66.0 in | Wt 268.2 lb

## 2018-12-21 DIAGNOSIS — Z170 Estrogen receptor positive status [ER+]: Secondary | ICD-10-CM

## 2018-12-21 DIAGNOSIS — C50212 Malignant neoplasm of upper-inner quadrant of left female breast: Secondary | ICD-10-CM

## 2018-12-21 DIAGNOSIS — Z9012 Acquired absence of left breast and nipple: Secondary | ICD-10-CM

## 2018-12-21 DIAGNOSIS — C50812 Malignant neoplasm of overlapping sites of left female breast: Secondary | ICD-10-CM

## 2018-12-21 NOTE — Progress Notes (Signed)
   Subjective:    Patient ID: Mackenzie [name], female    DOB: 1971-03-18, 48 y.o.   MRN: [id number]  The patient is a 48 yrs old bf here for follow up on her breast reconstruction on the left.  She is doing well and pleased with her progress.  The incisions are healing nicely.  There is no sign of seroma or hematoma in the front there is a seroma in the back and we will aspirate that.  No sign of infection.  She is tolerating the fills to the expander well.   Review of Systems  Constitutional: Negative.   HENT: Negative.   Eyes: Negative.   Respiratory: Negative.   Cardiovascular: Negative.   Gastrointestinal: Negative.   Musculoskeletal: Negative.        Objective:   Physical Exam Vitals signs and nursing note reviewed.  Constitutional:      Appearance: Normal appearance.  HENT:     Head: Normocephalic.  Cardiovascular:     Rate and Rhythm: Normal rate.     Pulses: Normal pulses.  Pulmonary:     Effort: Pulmonary effort is normal.  Neurological:     General: No focal deficit present.     Mental Status: She is alert. Mental status is at baseline.  Psychiatric:        Mood and Affect: Mood normal.         Assessment & Plan:     ICD-10-CM   1. Malignant neoplasm of upper-inner quadrant of left breast in female, estrogen receptor positive ([place])  C50.212    Z17.0   2. Acquired absence of left breast  Z90.12   3. Malignant 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, estrogen receptor positive ([place])  C50.812    Z17.0     We placed injectable saline in the Expander using a sterile technique: Left: 70 cc for a total of 470 / 455 cc  We were able to aspirate 150 cc of serosanginous fluid from the back.  Follow up in 2 weeks.

## 2019-01-03 NOTE — Progress Notes (Signed)
Patient ID: Mackenzie [name], female    DOB: July 31, 1970, 48 y.o.   MRN: [id number]   C.C.: breast reconstruction check  Mackenzie [name] is a 48 yo female who presents for follow up of her left breast reconstruction. She underwent latissimus flap and tissue expander placement on 11/03/18. She has been doing well with her expander fills. She had 150 cc of serosanginous fluid aspirated from the back 2 weeks ago. She has not noticed any tightness or swelling in her back since that time. She "feels fine" with the expander fills and would like to be a little bigger if possible.   Review of Systems  Constitutional: Negative.   HENT: Negative.   Respiratory: Negative.   Cardiovascular: Negative.   Gastrointestinal: Negative.   Genitourinary: Negative.   Musculoskeletal: Negative.   Skin: Negative.   Neurological: Negative.     Past Medical History:  Diagnosis Date  . Abnormal glucose 2018  . 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 Allergic rhinitis 2012  . Anemia 01/27/2017   prior to starting chemotherapy  . Breast cancer ([place]) 01/14/2017   Left breast  . 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 Diabetes mellitus 2012   GDM  . Hot flashes 03/2017  . Hyperlipidemia 03/20/2016  . Hypertension 2012   denies  . Morbid obesity with body mass index (BMI) of 40.0 to 49.9 ([place]) 09/17/2017  . NCGS (non-celiac gluten sensitivity)   . Pre-diabetes   . Seasonal allergies 2012   seasonal allergies causes allergic rhinitis and itchy, dry eyes per pt  . Vitamin D deficiency 05/2015    Past Surgical History:  Procedure Laterality Date  . BREAST RECONSTRUCTION WITH PLACEMENT OF TISSUE EXPANDER AND FLEX HD (ACELLULAR HYDRATED DERMIS) Left 08/27/2017   Procedure: LEFT BREAST RECONSTRUCTION WITH PLACEMENT OF TISSUE EXPANDER AND FLEX HD;  Surgeon: Wallace Going, DO;  Location: [place];  Service: Plastics;  Laterality: Left;  . CESAREAN SECTION     x2  . LATISSIMUS FLAP TO BREAST Left 11/03/2018  . LATISSIMUS FLAP TO BREAST Left 11/03/2018   Procedure: LATISSIMUS FLAP TO LEFT BREAST;  Surgeon: Wallace Going, DO;  Location: [facility];  Service: Plastics;  Laterality: Left;  Marland Kitchen MASTECTOMY MODIFIED RADICAL Left 08/27/2017  . MASTECTOMY MODIFIED RADICAL Left 08/27/2017   Procedure: LEFT MODIFIED RADICAL MASTECTOMY;  Surgeon: Erroll Luna, MD;  Location: [place];  Service: General;  Laterality: Left;  . PORT-A-CATH REMOVAL Right 08/27/2017   Procedure: REMOVAL PORT-A-CATH RIGHT CHEST;  Surgeon: Erroll Luna, MD;  Location: [place];  Service: General;  Laterality: Right;  . PORTACATH PLACEMENT Right 01/28/2017   Procedure: INSERTION PORT-A-CATH WITH ULTRASOUND;  Surgeon: Erroll Luna, MD;  Location: [place];  Service: General;  Laterality: Right;  . TISSUE EXPANDER PLACEMENT Left 11/03/2018   Procedure: PLACEMENT OF TISSUE EXPANDER LEFT BREAST;  Surgeon: Wallace Going, DO;  Location: [place];  Service: Plastics;  Laterality: Left;  Total case time is 3.5 hours  . TUBAL LIGATION Bilateral 01/21/2011      Current Outpatient Medications:  .  acetaminophen (TYLENOL) 500 MG tablet, Take 1,000 mg by mouth every 6 (six) hours as needed (for pain/headaches.). , Disp: , Rfl:  .  B Complex Vitamins (VITAMIN B COMPLEX PO), Take 1 capsule by mouth 2 (two) times daily. , Disp: , Rfl:  .  budesonide-formoterol (SYMBICORT) 80-4.5 MCG/ACT inhaler, Inhale 2 puffs into the lungs every morning. ,  Disp: , Rfl:  .  Calcium Carb-Cholecalciferol (CALCIUM 600+D3 PO), Take 1 tablet by mouth 2 (two) times daily. , Disp: , Rfl:  .  cetirizine (ZYRTEC) 10 MG tablet, Take 10 mg by mouth daily. , Disp: , Rfl:  .  Cholecalciferol (VITAMIN D-3) 5000 units TABS, Take 5,000 Units by mouth 2 (two) times daily. , Disp: , Rfl:  .  diazepam (VALIUM) 2 MG tablet, Take 1 tablet (2 mg total) by mouth every 8 (eight) hours as needed for muscle spasms., Disp:  30 tablet, Rfl: 0 .  gabapentin (NEURONTIN) 300 MG capsule, Take 1 capsule (300 mg total) by mouth at bedtime., Disp: 90 capsule, Rfl: 4 .  HYDROcodone-acetaminophen (NORCO) 5-325 MG tablet, Take 1 tablet by mouth every 6 (six) hours as needed for moderate pain., Disp: 30 tablet, Rfl: 0 .  hydroxypropyl methylcellulose / hypromellose (ISOPTO TEARS / GONIOVISC) 2.5 % ophthalmic solution, Place 1 drop into both eyes daily as needed for dry eyes., Disp: , Rfl:  .  hydrOXYzine (VISTARIL) 100 MG capsule, Take 1 capsule (100 mg total) by mouth 3 (three) times daily as needed for itching., Disp: 20 capsule, Rfl: 0 .  ibuprofen (ADVIL,MOTRIN) 200 MG tablet, Take 800 mg by mouth every 8 (eight) hours as needed for mild pain (for pain.). , Disp: , Rfl:  .  Investigational ribociclib (KISQALI) 200 MG tablet [name], Take 2 tablets (400 mg total) by mouth daily. Take 2 tablets (400mg  total) by mouth daily on days 1-21.  Repeat every 28 days., Disp: 225 tablet, Rfl: 0 .  ketoconazole (NIZORAL) 2 % cream, Apply 1 application topically daily., Disp: 15 g, Rfl: 0 .  letrozole (FEMARA) 2.5 MG tablet, Take 1 tablet (2.5 mg total) by mouth daily., Disp: 90 tablet, Rfl: 5 .  loratadine (CLARITIN) 10 MG tablet, Take 1 tablet (10 mg total) by mouth daily. (Patient taking differently: Take 10 mg by mouth daily as needed for allergies. ), Disp: 90 tablet, Rfl: 2 .  Multiple Vitamin (MULTIVITAMIN WITH MINERALS) TABS tablet, Take 1 tablet by mouth daily. , Disp: , Rfl:  .  Omega-3 Fatty Acids (FISH OIL) 1000 MG CAPS, Take 1,000 mg by mouth 2 (two) times daily. , Disp: , Rfl:  .  phenylephrine (SUDAFED PE) 10 MG TABS tablet, Take 10 mg by mouth every 4 (four) hours as needed., Disp: , Rfl:  .  Probiotic Product (PROBIOTIC DAILY PO), Take 1 capsule by mouth 2 (two) times daily. , Disp: , Rfl:  .  prochlorperazine (COMPAZINE) 10 MG tablet, Take 1 tablet (10 mg total) by mouth every 6 (six) hours as needed for nausea or  vomiting., Disp: 30 tablet, Rfl: 0 .  Turmeric 450 MG CAPS, Take 450 mg by mouth 2 (two) times daily., Disp: , Rfl:  .  ZINC OXIDE PO, Take 1 tablet by mouth 2 (two) times a day., Disp: , Rfl:  No current facility-administered medications for this visit.   Facility-Administered Medications Ordered in Other Visits:  .  sodium chloride flush (NS) 0.9 % injection 10 mL, 10 mL, Intravenous, PRN, Magrinat, [name], MD, 10 mL at 03/10/17 1239   Objective:   Vitals:   01/04/19 1406  BP: (!) 160/89  Pulse: 73  Temp: (!) 97.3 F (36.3 C)  SpO2: 99%    Physical Exam  General: alert, calm, no acute distress HEENT:normal Neck: supple, full ROM Chest: symmetrical rise and fall Lungs: unlabored breathing Breast: left breast with latissimus flap, incisions clean,  dry, intact Cardiac: +2 bilateral radial pulse, cap refill <3 sec Musculoskeletal: MAEx4 Neuro: A&O x3, calm, cooperative, steady gait Skin: left breast surgical scars, left flank and back incisions clean, dry, intact, no fluid wave in left flank   Assessment & Plan:  Acquired absence of left breast -  Malignant neoplasm of upper-inner quadrant of left breast in female, estrogen receptor positive ([place]) -    Patient is doing well overall. No signs of infection, seroma, or hematoma. She is getting close to her maximum expander fill capacity. Will place request for surgery date in September.    We placed injectable saline in the Expander using a sterile technique: Left: 70cc for a total of 540 / 455 cc   Picture placed in chart with patient's consent.  Alfredo Batty, NP

## 2019-01-04 ENCOUNTER — Ambulatory Visit (INDEPENDENT_AMBULATORY_CARE_PROVIDER_SITE_OTHER): Payer: 59 | Admitting: Nurse Practitioner

## 2019-01-04 ENCOUNTER — Encounter: Payer: Self-pay | Admitting: Nurse Practitioner

## 2019-01-04 ENCOUNTER — Other Ambulatory Visit: Payer: Self-pay

## 2019-01-04 VITALS — BP 160/89 | HR 73 | Temp 97.3°F | Ht 66.0 in | Wt 272.0 lb

## 2019-01-04 DIAGNOSIS — C50212 Malignant neoplasm of upper-inner quadrant of left female breast: Secondary | ICD-10-CM

## 2019-01-04 DIAGNOSIS — Z9012 Acquired absence of left breast and nipple: Secondary | ICD-10-CM

## 2019-01-04 DIAGNOSIS — Z170 Estrogen receptor positive status [ER+]: Secondary | ICD-10-CM

## 2019-01-05 ENCOUNTER — Telehealth (HOSPITAL_COMMUNITY): Payer: Self-pay | Admitting: Radiology

## 2019-01-05 NOTE — Telephone Encounter (Signed)
Left message to call office-Patient needs to schedule an echocardiogram.  

## 2019-01-10 ENCOUNTER — Inpatient Hospital Stay: Payer: 59 | Attending: Adult Health

## 2019-01-10 ENCOUNTER — Other Ambulatory Visit: Payer: Self-pay

## 2019-01-10 VITALS — BP 137/78 | HR 91 | Temp 98.5°F | Resp 18

## 2019-01-10 DIAGNOSIS — C50212 Malignant neoplasm of upper-inner quadrant of left female breast: Secondary | ICD-10-CM | POA: Diagnosis not present

## 2019-01-10 DIAGNOSIS — Z170 Estrogen receptor positive status [ER+]: Secondary | ICD-10-CM | POA: Diagnosis not present

## 2019-01-10 DIAGNOSIS — C50812 Malignant neoplasm of overlapping sites of left female breast: Secondary | ICD-10-CM

## 2019-01-10 DIAGNOSIS — Z006 Encounter for examination for normal comparison and control in clinical research program: Secondary | ICD-10-CM | POA: Insufficient documentation

## 2019-01-10 DIAGNOSIS — Z5111 Encounter for antineoplastic chemotherapy: Secondary | ICD-10-CM | POA: Insufficient documentation

## 2019-01-10 MED ORDER — GOSERELIN ACETATE 3.6 MG ~~LOC~~ IMPL
DRUG_IMPLANT | SUBCUTANEOUS | Status: AC
  Filled 2019-01-10: qty 3.6

## 2019-01-10 MED ORDER — GOSERELIN ACETATE 3.6 MG ~~LOC~~ IMPL
3.6000 mg | DRUG_IMPLANT | Freq: Once | SUBCUTANEOUS | Status: AC
  Administered 2019-01-10: 11:00:00 3.6 mg via SUBCUTANEOUS

## 2019-01-10 NOTE — Patient Instructions (Signed)

## 2019-01-12 DIAGNOSIS — H31012 Macula scars of posterior pole (postinflammatory) (post-traumatic), left eye: Secondary | ICD-10-CM | POA: Diagnosis not present

## 2019-01-12 DIAGNOSIS — H43823 Vitreomacular adhesion, bilateral: Secondary | ICD-10-CM | POA: Diagnosis not present

## 2019-01-12 DIAGNOSIS — H2513 Age-related nuclear cataract, bilateral: Secondary | ICD-10-CM | POA: Diagnosis not present

## 2019-01-14 ENCOUNTER — Other Ambulatory Visit: Payer: Self-pay | Admitting: Oncology

## 2019-01-14 DIAGNOSIS — Z170 Estrogen receptor positive status [ER+]: Secondary | ICD-10-CM

## 2019-01-14 DIAGNOSIS — C50812 Malignant neoplasm of overlapping sites of left female breast: Secondary | ICD-10-CM

## 2019-01-14 MED FILL — LETROZOLE 2.5 MG TABS: 2.5 | 30 days supply | Qty: 30 | Fill #0

## 2019-01-18 ENCOUNTER — Encounter: Payer: Self-pay | Admitting: Surgical

## 2019-01-18 ENCOUNTER — Ambulatory Visit (INDEPENDENT_AMBULATORY_CARE_PROVIDER_SITE_OTHER): Payer: 59 | Admitting: Surgical

## 2019-01-18 ENCOUNTER — Other Ambulatory Visit: Payer: Self-pay

## 2019-01-18 VITALS — BP 146/85 | HR 100 | Temp 98.2°F | Ht 66.0 in | Wt 266.4 lb

## 2019-01-18 DIAGNOSIS — C50212 Malignant neoplasm of upper-inner quadrant of left female breast: Secondary | ICD-10-CM

## 2019-01-18 DIAGNOSIS — Z170 Estrogen receptor positive status [ER+]: Secondary | ICD-10-CM

## 2019-01-18 DIAGNOSIS — Z9012 Acquired absence of left breast and nipple: Secondary | ICD-10-CM

## 2019-01-18 NOTE — Progress Notes (Signed)
   Subjective:     Patient ID: Mackenzie [name], female    DOB: 22-Jan-1971, 48 y.o.   MRN: [id number]  Chief Complaint  Patient presents with  . Follow-up    2 weeks    HPI: The patient is a 48 y.o. female here for follow-up on her left breast reconstruction. She is currently pleased with the size of her left expander. Per previous note, planning for surgery sometime in September pending schedule.  She currently has 540 cc in her left expander. She is doing well and is ready for another fill today.  Back incision is healing nicely, as well as her left breast flap. No sign of infection, seroma, hematoma.   Review of Systems  Constitutional: Negative for chills, diaphoresis and fever.  Eyes: Negative.   Respiratory: Negative.   Cardiovascular: Negative.   Musculoskeletal: Negative.   Skin: Negative.      Objective:   Vital Signs BP (!) 146/85 (BP Location: Right Arm, Patient Position: Sitting, Cuff Size: Large)   Pulse 100   Temp 98.2 F (36.8 C) (Temporal)   Ht 5\' 6"  (1.676 m)   Wt 266 lb 6.4 oz (120.8 kg)   SpO2 98%   BMI 43.00 kg/m  Vital Signs and Nursing Note Reviewed Chaperone present Physical Exam  Constitutional: She is oriented to person, place, and time and well-developed, well-nourished, and in no distress. No distress.  HENT:  Head: Normocephalic and atraumatic.  Cardiovascular: Normal rate.  Pulmonary/Chest: Effort normal.  Musculoskeletal: Normal range of motion.  Neurological: She is alert and oriented to person, place, and time.  Skin: Skin is warm and dry. No rash noted. She is not diaphoretic. No erythema. No pallor.  + radiation damage left chest   Psychiatric: Mood and affect normal.      Assessment/Plan:     ICD-10-CM   1. Acquired absence of left breast  Z90.12   2. Malignant neoplasm of upper-inner quadrant of left breast in female, estrogen receptor positive ([place])  C50.212    Z17.0     Overall, Mrs. Poliquin is doing great.   We  placed injectable saline in the Expander using a sterile technique: Left: 70cc for a total of 610/ 455 cc.  On exam, it does not appear there is 610 cc in her expander. Recommend taking the valium at night after expansions to help relax the muscle. No sign of expander leaking.  Follow up in 1 week for another fill. She should be ready for surgery (expander to implant exchange with mastopexy on right to match) by September/october.   Carola Rhine [place], PA-C 01/18/2019, 11:34 AM

## 2019-01-25 ENCOUNTER — Encounter: Payer: Self-pay | Admitting: Surgical

## 2019-01-25 ENCOUNTER — Other Ambulatory Visit: Payer: Self-pay

## 2019-01-25 ENCOUNTER — Ambulatory Visit (INDEPENDENT_AMBULATORY_CARE_PROVIDER_SITE_OTHER): Payer: 59 | Admitting: Surgical

## 2019-01-25 VITALS — BP 132/84 | HR 85 | Temp 98.0°F | Wt 272.6 lb

## 2019-01-25 DIAGNOSIS — Z170 Estrogen receptor positive status [ER+]: Secondary | ICD-10-CM

## 2019-01-25 DIAGNOSIS — C50212 Malignant neoplasm of upper-inner quadrant of left female breast: Secondary | ICD-10-CM

## 2019-01-25 DIAGNOSIS — Z9012 Acquired absence of left breast and nipple: Secondary | ICD-10-CM

## 2019-01-25 NOTE — Progress Notes (Signed)
   Subjective:     Patient ID: Mackenzie [name], female    DOB: 05/16/1971, 48 y.o.   MRN: [id number]  Chief Complaint  Patient presents with  . Follow-up    HPI: The patient is a 48 y.o. female here for follow-up on her left breast reconstruction.  She has started taking the valium periodically at night after fills. This has allowed the left pectoralis muscle to relax and the implant appears to be more in place now.   Today she is feeling well. 1 more fill and she will be ready for exchange. Latissimus flap is doing well. Incisions c/d/i, good capillary refill.  Review of Systems  Constitutional: Negative.   Respiratory: Negative.   Cardiovascular: Negative.   Musculoskeletal: Negative.  Negative for myalgias.  Skin: Negative.   Neurological: Negative.      Objective:   Vital Signs BP 132/84 (BP Location: Right Arm, Patient Position: Sitting, Cuff Size: Large)   Pulse 85   Temp 98 F (36.7 C) (Temporal)   Wt 272 lb 9.6 oz (123.7 kg)   SpO2 99%   BMI 44.00 kg/m  Vital Signs and Nursing Note Reviewed  Physical Exam  Constitutional: She is oriented to person, place, and time and well-developed, well-nourished, and in no distress. No distress.  HENT:  Head: Normocephalic and atraumatic.  Cardiovascular: Normal rate.  Pulmonary/Chest: Effort normal.    Musculoskeletal: Normal range of motion.  Neurological: She is alert and oriented to person, place, and time. Gait normal.  Skin: Skin is warm and dry. No rash noted. She is not diaphoretic. No erythema. No pallor.  Psychiatric: Mood and affect normal.      Assessment/Plan:     ICD-10-CM   1. Acquired absence of left breast  Z90.12   2. Malignant neoplasm of upper-inner quadrant of left breast in female, estrogen receptor positive ([place])  C50.212    Z17.0     Continue taking valium at night PRN for left chest tightness. This has helped significantly with expander settling.   She would like one more fill. She  is ready for surgery, mid - late September.  We placed injectable saline in the Expander using a sterile technique: Left:70 cc for a total of680 / 455cc.  Follow up in 10 days for last fill.  Carola Rhine Emillee Talsma, PA-C 01/25/2019, 11:05 AM

## 2019-02-02 ENCOUNTER — Telehealth: Payer: Self-pay | Admitting: *Deleted

## 2019-02-03 ENCOUNTER — Telehealth (HOSPITAL_COMMUNITY): Payer: Self-pay

## 2019-02-03 NOTE — Telephone Encounter (Signed)
New message   Just an FYI. We have made several attempts to contact this patient including sending a letter to schedule or reschedule their echocardiogram. We will be removing the patient from the echo WQ.   8.27.20 @ 10:30am mailbox is full Mackenzie [name]  01/05/2019 lmom evd 7.9.20 mail reminder letter Mackenzie [name]  COVID-19

## 2019-02-03 NOTE — Telephone Encounter (Signed)
Attempted to call patient twice to remind her about research lab appointment tomorrow and need to fast for 8 hours prior to lab appointment.  Her mailbox is full on her phone so I sent email and MyChart messages.  Foye Spurling, BSN, RN Clinical Research Nurse 02/03/2019 11:19 AM

## 2019-02-03 NOTE — Progress Notes (Signed)
   Subjective:     Patient ID: Mackenzie [name], female    DOB: 1970/11/04, 48 y.o.   MRN: [id number]  Chief Complaint  Patient presents with  . Follow-up    for absence of (L) breast    HPI: The patient is a 48 y.o. female here for follow-up on left breast reconstruction.   She is doing well. At her last visit we expanded 70 cc. She has been taking the valium PRN for pain after fills and it is noticeable that her implant has dropped into place more.   She currently has 750 cc after today's fill, but it does not seem that large on exam. It is difficult to assess if she is having issues expanding due to radiation damage to the skin or if not taking the valium caused her ribs to expand and flex more than the muscle. Photos taken today to determine if at her next visit she has less volume. She does not notice increased urination post-expansion.  No fevers, chills. Incisions c/d/i. Good latissimus flap capillary refill.  She is planning to speak with our staff about setting a date for surgery. Timing will depend on if we do expander to implant exchange + a mastopexy to match.  Review of Systems  Constitutional: Negative for chills, diaphoresis, fever, malaise/fatigue and weight loss.  Respiratory: Negative.   Cardiovascular: Negative.   Genitourinary: Negative.  Negative for frequency and urgency.  Musculoskeletal: Positive for myalgias.  Skin: Negative for itching and rash.  Neurological: Negative.      Objective:   Vital Signs BP (!) 144/84 (BP Location: Right Arm, Patient Position: Sitting, Cuff Size: Large)   Pulse 73   Temp (!) 97.5 F (36.4 C) (Temporal)   Ht 5\' 6"  (1.676 m)   Wt 272 lb 3.2 oz (123.5 kg)   SpO2 100%   BMI 43.93 kg/m  Vital Signs and Nursing Note Reviewed Chaperone present Physical Exam  Constitutional: She is oriented to person, place, and time and well-developed, well-nourished, and in no distress. No distress.  HENT:  Head: Normocephalic and  atraumatic.  Cardiovascular: Normal rate.  Pulmonary/Chest: Effort normal.  Musculoskeletal: Normal range of motion.  Neurological: She is alert and oriented to person, place, and time. Gait normal.  Skin: Skin is warm and dry. No rash noted. She is not diaphoretic. No erythema. No pallor.  Latissimus incision c/d/i. Good cap refill.   Psychiatric: Mood and affect normal.   Assessment/Plan:     ICD-10-CM   1. Acquired absence of left breast  Z90.12   2. Malignant neoplasm of upper-inner quadrant of left breast in female, estrogen receptor positive ([place])  C50.212    Z17.0    Follow up in 1 week for additional fill.   Will re-evaluate if there is a leak based on photos taken this visit.  We placed injectable saline in the Expander using a sterile technique: Left:70 cc for a total of750 / 455cc.  Pictures were obtained of the patient and placed in the chart with the patient's or guardian's permission.  Mackenzie [name] spoke with surgery scheduler today to determine a date for surgical intervention.   Mackenzie Rhine Ritesh Opara, PA-C 02/04/2019, 12:58 PM

## 2019-02-04 ENCOUNTER — Inpatient Hospital Stay: Payer: 59

## 2019-02-04 ENCOUNTER — Other Ambulatory Visit: Payer: Self-pay

## 2019-02-04 ENCOUNTER — Ambulatory Visit (INDEPENDENT_AMBULATORY_CARE_PROVIDER_SITE_OTHER): Payer: 59 | Admitting: Surgical

## 2019-02-04 ENCOUNTER — Encounter: Payer: Self-pay | Admitting: *Deleted

## 2019-02-04 ENCOUNTER — Encounter: Payer: Self-pay | Admitting: Surgical

## 2019-02-04 ENCOUNTER — Telehealth: Payer: Self-pay | Admitting: *Deleted

## 2019-02-04 VITALS — BP 144/84 | HR 73 | Temp 97.5°F | Ht 66.0 in | Wt 272.2 lb

## 2019-02-04 DIAGNOSIS — Z9012 Acquired absence of left breast and nipple: Secondary | ICD-10-CM

## 2019-02-04 DIAGNOSIS — C50212 Malignant neoplasm of upper-inner quadrant of left female breast: Secondary | ICD-10-CM

## 2019-02-04 DIAGNOSIS — Z170 Estrogen receptor positive status [ER+]: Secondary | ICD-10-CM

## 2019-02-04 DIAGNOSIS — C50812 Malignant neoplasm of overlapping sites of left female breast: Secondary | ICD-10-CM

## 2019-02-04 LAB — CMP (CANCER CENTER ONLY)
ALT: 27 U/L (ref 0–44)
AST: 22 U/L (ref 15–41)
Albumin: 3.8 g/dL (ref 3.5–5.0)
Alkaline Phosphatase: 120 U/L (ref 38–126)
Anion gap: 6 (ref 5–15)
BUN: 15 mg/dL (ref 6–20)
CO2: 25 mmol/L (ref 22–32)
Calcium: 8.9 mg/dL (ref 8.9–10.3)
Chloride: 106 mmol/L (ref 98–111)
Creatinine: 1.01 mg/dL — ABNORMAL HIGH (ref 0.44–1.00)
GFR, Est AFR Am: >60 mL/min (ref >60)
GFR, Estimated: >60 mL/min (ref >60)
Glucose, Bld: 106 mg/dL — ABNORMAL HIGH (ref 70–99)
Potassium: 4.1 mmol/L (ref 3.5–5.1)
Sodium: 137 mmol/L (ref 135–145)
Total Bilirubin: 0.3 mg/dL (ref 0.3–1.2)
Total Protein: 7.4 g/dL (ref 6.5–8.1)

## 2019-02-04 LAB — BILIRUBIN, DIRECT: Bilirubin, Direct: 0.1 mg/dL (ref 0.0–0.2)

## 2019-02-04 LAB — MAGNESIUM: Magnesium: 1.8 mg/dL (ref 1.7–2.4)

## 2019-02-04 LAB — CBC WITH DIFFERENTIAL (CANCER CENTER ONLY)
Abs Immature Granulocytes: 0.02 10*3/uL (ref 0.00–0.07)
Basophils Absolute: 0 10*3/uL (ref 0.0–0.1)
Basophils Relative: 1 %
Eosinophils Absolute: 0 10*3/uL (ref 0.0–0.5)
Eosinophils Relative: 1 %
HCT: 34.8 % — ABNORMAL LOW (ref 36.0–46.0)
Hemoglobin: 11.2 g/dL — ABNORMAL LOW (ref 12.0–15.0)
Immature Granulocytes: 1 %
Lymphocytes Relative: 34 %
Lymphs Abs: 1.1 10*3/uL (ref 0.7–4.0)
MCH: 28.2 pg (ref 26.0–34.0)
MCHC: 32.2 g/dL (ref 30.0–36.0)
MCV: 87.7 fL (ref 80.0–100.0)
Monocytes Absolute: 0.5 10*3/uL (ref 0.1–1.0)
Monocytes Relative: 14 %
Neutro Abs: 1.7 10*3/uL (ref 1.7–7.7)
Neutrophils Relative %: 49 %
Platelet Count: 217 10*3/uL (ref 150–400)
RBC: 3.97 MIL/uL (ref 3.87–5.11)
RDW: 15.6 % — ABNORMAL HIGH (ref 11.5–15.5)
WBC Count: 3.3 10*3/uL — ABNORMAL LOW (ref 4.0–10.5)
nRBC: 0 % (ref 0.0–0.2)

## 2019-02-04 LAB — LACTATE DEHYDROGENASE: LDH: 193 U/L — ABNORMAL HIGH (ref 98–192)

## 2019-02-04 LAB — LIPASE, BLOOD: Lipase: 23 U/L (ref 11–51)

## 2019-02-04 LAB — PHOSPHORUS: Phosphorus: 2.9 mg/dL (ref 2.5–4.6)

## 2019-02-04 LAB — URIC ACID: Uric Acid, Serum: 4.1 mg/dL (ref 2.5–7.1)

## 2019-02-04 LAB — GAMMA GT: GGT: 20 U/L (ref 7–50)

## 2019-02-04 LAB — AMYLASE: Amylase: 60 U/L (ref 28–100)

## 2019-02-04 NOTE — Research (Signed)
Assessment prior to C10D1 of [name]: Patient into clinic by herself this morning to complete some of the study activities prior to cycle 10.   PROs; Study questionnaires given to patient to complete prior to her lab appointment this morning.  Patient completed by herself while waiting for lab appointment. Research nurse collected and checked for completeness and accuracy.  Labs; Labs drawn per protocol.  Patient confirmed she has been fasting for at least 8 hours prior to this appointment. Informed patient research nurse will call her if any urgent results today. She has not taken Letrozole yet today and instructed patient she may take as soon as her labs are collected.  Plan; Patient confirmed her appointments for Monday 02/07/19 to start cycle 10. Reminded patient to bring in her Ribociclib bottles with leftover pills and her medication diaries on Monday. Remind patient to not take her Letrozole before her appointment as she will need to take in clinic that day along with the Ribociclib. She is also scheduled for to get Zoladex injection. Patient reports she is scheduled to have breast implants exchanged for the expanders on 02/18/19. Will discuss with Dr. Jana Hakim on Monday if this requires any holding of Ribociclib dose. This will be at his discretion. Patient verbalized understanding.   Thanked patient for her time today and encouraged to call if any questions or concerns prior to Monday.  Foye Spurling, BSN, RN Clinical Research Nurse 02/04/2019 10:57 AM

## 2019-02-04 NOTE — Telephone Encounter (Signed)
LVM for patient regarding Lab at 9:15 am.  It is now 9:30 am and patient has not checked in yet.  Asked patient to please return call to reschedule if needed to later today or early Monday morning. Labs have to be done after fasting for at least 8 hours.  Foye Spurling, BSN, RN Clinical Research Nurse 02/04/2019 9:35 AM

## 2019-02-05 NOTE — Progress Notes (Signed)
[place]  [phone] [phone]    ID: [name] DOB: [birth year]  MR#: [id number]  TLX#:[id number]  Patient Care Team: Willey Blade, MD as PCP - General (Internal Medicine) Garin Mata, [name], MD as Consulting Physician (Oncology) Erroll Luna, MD as Consulting Physician (General Surgery) Kyung Rudd, MD as Consulting Physician (Radiation Oncology) Dillingham, Loel Lofty, DO as Attending Physician (Plastic Surgery) Cathlean Cower, RN as Registered Nurse OTHER MD:    CHIEF COMPLAINT: Estrogen receptor positive breast cancer  CURRENT TREATMENT: letrozole, goserelin; on [name] trial (ribociclib arm)   INTERVAL HISTORY: Reyanna returns today for follow-up and treatment of her estrogen receptor positive breast cancer. She was last seen here on 11/15/2018.  Our research nurse cameo participated in today's visit She continues on letrozole.  She tolerates this well, with no significant hot flashes or vaginal dryness problems.  She also continues on goserelin every 4 weeks. The most recent dose was received on 01/10/2019.  She has a dose due today.   Amiree's last bone density screening on 03/16/2018, showed a T-score of -0.7, which is considered normal.    Since her last visit here, she has not undergone any additional studies. Her last mammogram was on 03/16/2018.    REVIEW OF SYSTEMS: Zola walks about 3-1/2 miles on most days.  She tries running as well.  She and her 2 children age 50 and 64 are taking appropriate pandemic precautions.  She is getting the left expander filled and is looking to surgery in October.  Sometimes she feels a little bit of numbness in both arms.  She also has pain in her legs which probably is related to the walking/running.  I do not think either of those symptoms are related to her study medications.  ASide from these issues a detailed review of systems today was noncontributory    HISTORY OF  CURRENT ILLNESS: From the original intake note:  The patient herself noted some changes in her left breast late July 2018, she says, and eventually brought this to medical attention so that on 01/09/2017 she underwent bilateral diagnostic mammography with tomography and left breast ultrasonography at the breast Center. This found the breast density to be category C. In the upper inner quadrant of the left breast there was an area of asymmetry and there were malignant type calcifications involving all 4 quadrants. On exam there is firmness and palpable thickening in the anterior left breast with skin dimpling. Ultrasonography found at the 9:30 o'clock radiant 3 cm from the nipple a 2.7 cm mass and in the left axilla for abnormal lymph nodes largest of which measured 2.7 cm.  Biopsy of the left breast 9:30 o'clock mass 80 01/26/2017 showed ([id number]) invasive ductal carcinoma, with extracellular mucin, grade 1 or 2. In the lower outer left breast is separated biopsy the same day showed ductal carcinoma in situ. One of the 4 lymph nodes involved was positive for metastatic carcinoma. Prognostic panel on the invasive disease showed it to be estrogen receptor 100% positive, progesterone receptor 20% positive, both with strong staining intensity, with an MIB-1 of 20%, and no HER-2 amplification with a signals ratio 1.38 and the number per cell 2.90.  On 01/22/2017 the patient underwent bilateral breast MRI. This showed no involvement of the right breast, but in the left breast there was a masslike and non-masslike enhancement involving all quadrants, with skin swelling but no abnormal enhancement of the skin or nipple areolar complex or pectoralis muscle. The  mass could not be clearly measured but spanned approximately 12 cm. There was bulky left axillary lymphadenopathy, with the largest lymph node measuring up to 3.4 cm.  The patient's subsequent history is as detailed below.   PAST MEDICAL HISTORY: Past  Medical History:  Diagnosis Date  . Abnormal glucose 2018  . 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 Allergic rhinitis 2012  . Anemia 01/27/2017   prior to starting chemotherapy  . Breast cancer ([place]) 01/14/2017   Left breast  . 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 Diabetes mellitus 2012   GDM  . Hot flashes 03/2017  . Hyperlipidemia 03/20/2016  . Hypertension 2012   denies  . Morbid obesity with body mass index (BMI) of 40.0 to 49.9 ([place]) 09/17/2017  . NCGS (non-celiac gluten sensitivity)   . Pre-diabetes   . Seasonal allergies 2012   seasonal allergies causes allergic rhinitis and itchy, dry eyes per pt  . Vitamin D deficiency 05/2015    PAST SURGICAL HISTORY: Past Surgical History:  Procedure Laterality Date  . BREAST RECONSTRUCTION WITH PLACEMENT OF TISSUE EXPANDER AND FLEX HD (ACELLULAR HYDRATED DERMIS) Left 08/27/2017   Procedure: LEFT BREAST RECONSTRUCTION WITH PLACEMENT OF TISSUE EXPANDER AND FLEX HD;  Surgeon: Wallace Going, DO;  Location: [facility];  Service: Plastics;  Laterality: Left;  . CESAREAN SECTION     x2  . LATISSIMUS FLAP TO BREAST Left 11/03/2018  . LATISSIMUS FLAP TO BREAST Left 11/03/2018   Procedure: LATISSIMUS FLAP TO LEFT BREAST;  Surgeon: Wallace Going, DO;  Location: [facility];  Service: Plastics;  Laterality: Left;  Marland Kitchen MASTECTOMY MODIFIED RADICAL Left 08/27/2017  . MASTECTOMY MODIFIED RADICAL Left 08/27/2017   Procedure: LEFT MODIFIED RADICAL MASTECTOMY;  Surgeon: Erroll Luna, MD;  Location: [facility];  Service: General;  Laterality: Left;  . PORT-A-CATH REMOVAL Right 08/27/2017   Procedure: REMOVAL PORT-A-CATH RIGHT CHEST;  Surgeon: Erroll Luna, MD;  Location: [facility];  Service: General;  Laterality: Right;  . PORTACATH PLACEMENT Right 01/28/2017   Procedure: INSERTION PORT-A-CATH WITH ULTRASOUND;  Surgeon: Erroll Luna, MD;  Location: [facility];  Service: General;  Laterality:  Right;  . TISSUE EXPANDER PLACEMENT Left 11/03/2018   Procedure: PLACEMENT OF TISSUE EXPANDER LEFT BREAST;  Surgeon: Wallace Going, DO;  Location: [facility];  Service: Plastics;  Laterality: Left;  Total case time is 3.5 hours  . TUBAL LIGATION Bilateral 01/21/2011    FAMILY HISTORY Family History  Problem Relation Age of Onset  . Breast cancer Paternal Grandmother 73       d.60s from breast cancer. Did not have treatment.  . Other Mother        H.70 from complications of surgery to remove brain tumor   The patient's father still alive at age 25. The patient's mother died with a brain tumor which the patient says was "benign". She was 56. The patient has one brother, no sisters. The only breast cancer in the family is a paternal grandmother who died from breast cancer at an unknown age. There is no history of ovarian or prostate cancer in the family.   GYNECOLOGIC HISTORY:  No LMP recorded. (Menstrual status: Chemotherapy). Menarche age 44 and first live birth age 60 she is [place] P4. The patient is still having regular periods as of August 2018   SOCIAL HISTORY: (Updated 05/31/2018) Afnan works at [employer] on the fifth floor, [place]. She is separated from her husband (as  of 05/2018), [name], who is a Building control surveyor. Daughter is married and lives in [place] and works as a Education administrator. Atavia's daughter, [name], lives in [place] and works in the police department. Daughters, [place] and [place] are 75 and 6, living at home. The patient has a grandson she looks after. She attends a [employer].     ADVANCED DIRECTIVES: Not in place   HEALTH MAINTENANCE: Social History   Tobacco Use  . Smoking status: Never Smoker  . Smokeless tobacco: Never Used  Substance Use Topics  . Alcohol use: Yes    Comment: social  . Drug use: No    Colonoscopy: Never  PAP: December 2017  Bone density: Never   No Known Allergies  Current Outpatient Medications  Medication Sig  Dispense Refill  . acetaminophen (TYLENOL) 500 MG tablet Take 1,000 mg by mouth every 6 (six) hours as needed (for pain/headaches.).     Marland Kitchen B Complex Vitamins (VITAMIN B COMPLEX PO) Take 1 capsule by mouth 2 (two) times daily.     . budesonide-formoterol (SYMBICORT) 80-4.5 MCG/ACT inhaler Inhale 2 puffs into the lungs every morning.     . Calcium Carb-Cholecalciferol (CALCIUM 600+D3 PO) Take 1 tablet by mouth 2 (two) times daily.     . cetirizine (ZYRTEC) 10 MG tablet Take 10 mg by mouth daily.     . Cholecalciferol (VITAMIN D-3) 5000 units TABS Take 5,000 Units by mouth 2 (two) times daily.     . diazepam (VALIUM) 2 MG tablet Take 1 tablet (2 mg total) by mouth every 8 (eight) hours as needed for muscle spasms. 30 tablet 0  . gabapentin (NEURONTIN) 300 MG capsule Take 1 capsule (300 mg total) by mouth at bedtime. 90 capsule 4  . hydroxypropyl methylcellulose / hypromellose (ISOPTO TEARS / GONIOVISC) 2.5 % ophthalmic solution Place 1 drop into both eyes daily as needed for dry eyes.    . hydrOXYzine (VISTARIL) 100 MG capsule Take 1 capsule (100 mg total) by mouth 3 (three) times daily as needed for itching. 20 capsule 0  . ibuprofen (ADVIL,MOTRIN) 200 MG tablet Take 800 mg by mouth every 8 (eight) hours as needed for mild pain (for pain.).     . Investigational ribociclib (KISQALI) 200 MG tablet [name] Take 2 tablets (400 mg total) by mouth daily. Take 2 tablets ('400mg'$  total) by mouth daily on days 1-21.  Repeat every 28 days. 225 tablet 0  . letrozole (FEMARA) 2.5 MG tablet TAKE 1 TABLET BY MOUTH DAILY. 30 tablet 1  . loratadine (CLARITIN) 10 MG tablet Take 1 tablet (10 mg total) by mouth daily. (Patient taking differently: Take 10 mg by mouth daily as needed for allergies. ) 90 tablet 2  . Multiple Vitamin (MULTIVITAMIN WITH MINERALS) TABS tablet Take 1 tablet by mouth daily.     . Omega-3 Fatty Acids (FISH OIL) 1000 MG CAPS Take 1,000 mg by mouth 2 (two) times daily.     . phenylephrine  (SUDAFED PE) 10 MG TABS tablet Take 10 mg by mouth every 4 (four) hours as needed.    . Probiotic Product (PROBIOTIC DAILY PO) Take 1 capsule by mouth 2 (two) times daily.     . prochlorperazine (COMPAZINE) 10 MG tablet Take 1 tablet (10 mg total) by mouth every 6 (six) hours as needed for nausea or vomiting. 30 tablet 0  . Turmeric 450 MG CAPS Take 450 mg by mouth 2 (two) times daily.    Marland Kitchen ZINC OXIDE PO Take 1 tablet  by mouth 2 (two) times a day.     No current facility-administered medications for this visit.    Facility-Administered Medications Ordered in Other Visits  Medication Dose Route Frequency Provider Last Rate Last Dose  . sodium chloride flush (NS) 0.9 % injection 10 mL  10 mL Intravenous PRN Sorina Derrig, [name], MD   10 mL at 03/10/17 1239    OBJECTIVE: Morbidly obese African-American woman who appears stated age  Vitals:   02/07/19 1015  BP: 131/79  Pulse: 81  Resp: 18  Temp: 98.3 F (36.8 C)  SpO2: 100%   Wt Readings from Last 3 Encounters:  02/07/19 268 lb 9.6 oz (121.8 kg)  02/04/19 272 lb 3.2 oz (123.5 kg)  01/25/19 272 lb 9.6 oz (123.7 kg)   Body mass index is 43.35 kg/m.    ECOG FS: 1  Ocular: Sclerae unicteric, pupils round and equal Ear-nose-throat: Wearing a mask Lymphatic: No cervical or supraclavicular adenopathy Lungs no rales or rhonchi Heart regular rate and rhythm Abd soft, nontender, positive bowel sounds MSK no focal spinal tenderness, no joint edema Neuro: non-focal, well-oriented, appropriate affect Breasts: The right breast is unremarkable.  The left breast is status post mastectomy with expander in place and latissimus flap reconstruction.  There is no evidence of local recurrence.  Both axillae are benign.    LAB RESULTS:  No visits with results within 3 Day(s) from this visit.  Latest known visit with results is:  Appointment on 02/04/2019  Component Date Value Ref Range Status  . Uric Acid, Serum 02/04/2019 4.1  2.5 - 7.1 mg/dL  Final   Performed at [facility], [place] [address] [place], [place] [address]  . Phosphorus 02/04/2019 2.9  2.5 - 4.6 mg/dL Final   Performed at [place] [address], [name], [place] [address]  . Magnesium 02/04/2019 1.8  1.7 - 2.4 mg/dL Final   Performed at [facility], [place] [address] [place], [place] [address]  . Lipase 02/04/2019 23  11 - 51 U/L Final   Performed at [facility], [place] [address] [place], [place] [address]  . LDH 02/04/2019 193* 98 - 192 U/L Final   Performed at [facility], [place] [address], [place], [place] [address]  . GGT 02/04/2019 20  7 - 50 U/L Final   Performed at [facility], [place] [address], [place], [place] [address]  . Sodium 02/04/2019 137  135 - 145 mmol/L Final  . Potassium 02/04/2019 4.1  3.5 - 5.1 mmol/L Final  . Chloride 02/04/2019 106  98 - 111 mmol/L Final  . CO2 02/04/2019 25  22 - 32 mmol/L Final  . Glucose, Bld 02/04/2019 106* 70 - 99 mg/dL Final  . BUN 02/04/2019 15  6 - 20 mg/dL Final  . Creatinine 02/04/2019 1.01* 0.44 - 1.00 mg/dL Final  . Calcium 02/04/2019 8.9  8.9 - 10.3 mg/dL Final  . Total Protein 02/04/2019 7.4  6.5 - 8.1 g/dL Final  . Albumin 02/04/2019 3.8  3.5 - 5.0 g/dL Final  . AST 02/04/2019 22  15 - 41 U/L Final  . ALT 02/04/2019 27  0 - 44 U/L Final  . Alkaline Phosphatase 02/04/2019 120  38 - 126 U/L Final  . Total Bilirubin 02/04/2019 0.3  0.3 - 1.2 mg/dL Final  . GFR, Est Non Af Am 02/04/2019 >60  >60 mL/min Final  . GFR, Est AFR Am 02/04/2019 >60  >  60 mL/min Final  . Anion gap 02/04/2019 6  5 - 15 Final   Performed at [facility], [place] [address] [place], [place] [address]  . WBC Count 02/04/2019 3.3* 4.0 - 10.5 K/uL Final  . RBC 02/04/2019 3.97  3.87 - 5.11 MIL/uL Final  . Hemoglobin 02/04/2019 11.2* 12.0 - 15.0 g/dL Final  . HCT 02/04/2019 34.8* 36.0 -  46.0 % Final  . MCV 02/04/2019 87.7  80.0 - 100.0 fL Final  . MCH 02/04/2019 28.2  26.0 - 34.0 pg Final  . MCHC 02/04/2019 32.2  30.0 - 36.0 g/dL Final  . RDW 02/04/2019 15.6* 11.5 - 15.5 % Final  . Platelet Count 02/04/2019 217  150 - 400 K/uL Final  . nRBC 02/04/2019 0.0  0.0 - 0.2 % Final  . Neutrophils Relative % 02/04/2019 49  % Final  . Neutro Abs 02/04/2019 1.7  1.7 - 7.7 K/uL Final  . Lymphocytes Relative 02/04/2019 34  % Final  . Lymphs Abs 02/04/2019 1.1  0.7 - 4.0 K/uL Final  . Monocytes Relative 02/04/2019 14  % Final  . Monocytes Absolute 02/04/2019 0.5  0.1 - 1.0 K/uL Final  . Eosinophils Relative 02/04/2019 1  % Final  . Eosinophils Absolute 02/04/2019 0.0  0.0 - 0.5 K/uL Final  . Basophils Relative 02/04/2019 1  % Final  . Basophils Absolute 02/04/2019 0.0  0.0 - 0.1 K/uL Final  . Immature Granulocytes 02/04/2019 1  % Final  . Abs Immature Granulocytes 02/04/2019 0.02  0.00 - 0.07 K/uL Final   Performed at [facility], [place] [address] [place], [place] [address]  . Bilirubin, Direct 02/04/2019 0.1  0.0 - 0.2 mg/dL Final   Performed at [place] [address], [place], [place] [address]  . Amylase 02/04/2019 60  28 - 100 U/L Final   Performed at [facility], [place] [address], [place], [place] [address]     STUDIES: No results found.   ELIGIBLE FOR AVAILABLE RESEARCH PROTOCOL: [name], ASA study   ASSESSMENT: 48 y.o. [place] woman status post left breast upper inner quadrant biopsy 01/14/2017 for a clinical T3 N2, stage IIA invasive ductal carcinoma, grade 1 or 2, estrogen and progesterone receptor positive, HER-2 nonamplified, with an MIB-1 of 20%.  (1) staging studies: Brain MRI, bone scan, and CT scan of the chest 02/05/2017 showed no brain lesions, no lung or liver lesions, a 4.9 cm mass in the left breast with left axillary and subpectoral adenopathy, and nonspecific bone scan tracer at L2,  left scapula, and anterior ribs, with lumbar spine MRI suggested for further evaluation.  (a) lumbar spine MRI 02/17/2017 showed no normal bone lesions.  There was mild lumbar spondylosis  (2) neoadjuvant chemotherapy consisting of cyclophosphamide and doxorubicin in dose dense fashion 4 started 02/10/2017, completed 03/24/2017, followed by weekly carboplatin and gemcitabine given days 1 and 8 of each 21-day cycle starting 04/14/2017, completing the planned 4 cycles 06/26/2017  (3) is post left modified radical mastectomy on 08/27/2017 showing an mpT3 pN2 residual invasive ductal carcinoma, grade 2, with a residual cancer burden of 3.  Margins were clear  (a) latissimus flap reconstruction with expander 11/03/2018  (4) postmastectomy radiation completed 01/01/2018  (a) capecitabine radiosensitization 11/16/2017-01/01/2018  (5) goserelin started 09/21/2017  (a) letrozole started 01/11/2018  (b) enrolled in [place] clinical trial, randomized to ribociclib on 05/31/2018  (c) Bone density on 03/16/2018 was normal with T score of -0.7 in the  L1-L4 spine   (6) genetics testing 03/24/2017 through the Common Hereditary Cancer Panel offered by [facility] found no deleterious mutations in APC, ATM, AXIN2, BARD1, BMPR1A, BRCA1, BRCA2, BRIP1, CDH1, CDKN2A (p14ARF), CDKN2A (p16INK4a), CHEK2, CTNNA1, DICER1, EPCAM (Deletion/duplication testing only), GREM1 (promoter region deletion/duplication testing only), KIT, MEN1, MLH1, MSH2, MSH3, MSH6, MUTYH, NBN, NF1, NHTL1, PALB2, PDGFRA, PMS2, POLD1, POLE, PTEN, RAD50, RAD51C, RAD51D, SDHB, SDHC, SDHD, SMAD4, SMARCA4. STK11, TP53, TSC1, TSC2, and VHL.  The following genes were evaluated for sequence changes only: SDHA and HOXB13 c.251G>A variant only.     PLAN: Shakeda is now over a year out from definitive surgery for her breast cancer with no evidence of disease recurrence.  This is favorable.  She is tolerating the letrozole/goserelin well and we are continuing as  per study protocol.  I do not think any of the symptoms noted in the review of systems a related to her study medications.  I would prefer to hold cycle 11 of ribociclib so as not to interfere with her upcoming surgery.  She will return to see me at the start of cycle 12 just to check on how she is doing postop and to check on counts  I commended her excellent exercise program.  She knows to call for any other issue that may develop before the next visit.  Leanah Kolander, [name], MD  02/07/19 10:45 AM Medical Oncology and Hematology [facility] [address] [place], [place] [address] Tel. [phone]    Fax. [phone]  I, Jacqualyn Posey am acting as a Education administrator for Chauncey Cruel, MD.   I, Lurline Del MD, have reviewed the above documentation for accuracy and completeness, and I agree with the above.

## 2019-02-07 ENCOUNTER — Other Ambulatory Visit: Payer: Self-pay

## 2019-02-07 ENCOUNTER — Encounter: Payer: Self-pay | Admitting: Oncology

## 2019-02-07 ENCOUNTER — Encounter: Payer: Self-pay | Admitting: *Deleted

## 2019-02-07 ENCOUNTER — Inpatient Hospital Stay: Payer: 59

## 2019-02-07 ENCOUNTER — Inpatient Hospital Stay (HOSPITAL_BASED_OUTPATIENT_CLINIC_OR_DEPARTMENT_OTHER): Payer: 59 | Admitting: Oncology

## 2019-02-07 VITALS — BP 131/79 | HR 81 | Temp 98.3°F | Resp 18 | Ht 66.0 in | Wt 268.6 lb

## 2019-02-07 DIAGNOSIS — Z006 Encounter for examination for normal comparison and control in clinical research program: Secondary | ICD-10-CM | POA: Diagnosis not present

## 2019-02-07 DIAGNOSIS — C773 Secondary and unspecified malignant neoplasm of axilla and upper limb lymph nodes: Secondary | ICD-10-CM

## 2019-02-07 DIAGNOSIS — C50912 Malignant neoplasm of unspecified site of left female breast: Secondary | ICD-10-CM

## 2019-02-07 DIAGNOSIS — C50212 Malignant neoplasm of upper-inner quadrant of left female breast: Secondary | ICD-10-CM

## 2019-02-07 DIAGNOSIS — Z5111 Encounter for antineoplastic chemotherapy: Secondary | ICD-10-CM | POA: Diagnosis not present

## 2019-02-07 DIAGNOSIS — Z170 Estrogen receptor positive status [ER+]: Secondary | ICD-10-CM | POA: Diagnosis not present

## 2019-02-07 DIAGNOSIS — C50812 Malignant neoplasm of overlapping sites of left female breast: Secondary | ICD-10-CM

## 2019-02-07 MED ORDER — GOSERELIN ACETATE 3.6 MG ~~LOC~~ IMPL
DRUG_IMPLANT | SUBCUTANEOUS | Status: AC
  Filled 2019-02-07: qty 3.6

## 2019-02-07 MED ORDER — LETROZOLE 2.5 MG PO TABS: 2.5000 mg | ORAL_TABLET | Freq: Every day | ORAL | 4 refills | Status: DC

## 2019-02-07 MED ORDER — GOSERELIN ACETATE 3.6 MG ~~LOC~~ IMPL
3.6000 mg | DRUG_IMPLANT | Freq: Once | SUBCUTANEOUS | Status: AC
  Administered 2019-02-07: 3.6 mg via SUBCUTANEOUS

## 2019-02-07 MED FILL — LETROZOLE 2.5 MG TABS: 2.5 | 90 days supply | Qty: 90 | Fill #0

## 2019-02-07 NOTE — Research (Addendum)
C10D1 [name]: Patient into clinic by herself this morning to complete the study activities prior to cycles 10, 11 and 12.   PROs; Completed prior to lab on 02/04/19.  Labs; Completed on 02/04/19. Results reviewed by Dr. Jana Hakim and he did not consider the abnormal results for RDW, LDH, Glucose and Creatinine as being clinically significant. Corrected Calcium Formula:  4 g/dL utilized for "normal albumin;" local normal range is 3.5-5 g/dL. Corrected Calcium = (0.8 x [normal albumin - patient's albumin]) + serum calcium Correct Calcium = (0.8 x[4- 3.8]) + 8.9 Corrected Calcium = 9.06 Vitals; Vital signs completed after patient rested more than five minutes. Weight without shoes.  Concomitant Medications; Reviewed medication list with patient.  Updated in EMR. Physical Exam/PS - Please see documentation in today's office visit encounter with Dr. Jana Hakim. Based on lab results review and history and physical exam by Dr. Jana Hakim, patient condition was deemed acceptable for continued treatment with the next treatment cycle beginning today.  Adverse Events;  Reviewed with patient. See AE table below.  Investigational Medication - Completed Cycle 7, 8 and 9 medication diaries were returned by patient showing no missed doses of Ribociclib or Letrozole. Cycles 7,8 and 9  study medication bottles were returned to pharmacy for accounting by pharmacist Demetrius Charity. Pill count revealed 33 tablets remaining in each bottle, indicating no missed doses. Investigational drug (x 3 bottles, to cover cycles 10 through 12), was dispensed by pharmacist Demetrius Charity today continuing at the full dose of 400 daily. Patient was provided with 3 copies of the Patient Diary for recording medication doses (letrozole and ribociclib), during cycles 10, 11 and 12.  Patient self-administered ribociclib and letrozole under the supervision of the research nurse. Patient filled in diary dosing for today with time of 11:51am.Patient  understands that once a bottle is opened it should not be used after 30 days. Patient was instructed to open a new bottle with each cycle as marked, filling in the date opened, and to retain the partial bottles for return at her next clinic visit. Plan/Scheduling - Breast Surgery to complete implants for reconstruction scheduled for 03/17/19. Dr. Jana Hakim instructed for patient to hold Cycle 11 for surgery and to be seen back in clinic prior to starting on Cycle 12.  Patient is aware that she should contact the clinic at any time ifanysymptomsworsen orrecur, or if she has any questions or concerns prior to next visit.  Thanked patient for her ongoing participation in this clnical trial.    AE Table: Event Grade Onset Date End Date Status Comments Attribution to Ribociclib Attribution to Letrozole Attribution to Goserelin  Allergic Rhinitis 2 2012  ongoing At Baseline n/a n/a n/a  Anemia 1 2018  ongoing At Baseline n/a n/a n/a  Constipation 1 08/27/17  ongoing At Baseline n/a n/a n/a  Cough, intermittent 2 05/09/18  ongoing At Baseline n/a n/a n/a  GERD, intermittent 1 2018  ongoing At Baseline n/a n/a n/a  Gluten Sensitivity 1 12/2017  ongoing At Baseline n/a n/a n/a  Headaches 1 02/2017  ongoing At Baseline n/a n/a n/a  Hot Flashes 2 03/2017  ongoing At Baseline n/a n/a n/a  Hyperglycemia 1 2018  ongoing At Baseline n/a n/a n/a  Hyperlipidemia 1 03/2016  ongoing At Baseline n/a n/a n/a  Vitamin D Deficiency 2 05/2015  ongoing At Baseline n/a n/a n/a  Fatigue 1 06/28/18  ongoing   Yes No No  Blurred Vision 1 07/14/18  ongoing  No No No  Hypertension 1 09/20/18 01/04/19 increased To grade 3 No No No  Pain at surgical site 2 10/2718 12/08/18 ongoing  No No No  Nausea, intermittent 1 11/03/18 12/08/18 ongoing  No No No  Hypoalbuminemia 1 11/12/18 02/04/19 ongoing Clinically Insignificant No No No  Hypertension 3 01/04/19 01/18/19 decreased  No No No  Hypertension 2 01/18/19 01/25/19 decreased Back to  baseline No No No  Hypertension 1 01/25/19 02/04/19 increased  No No No  Hypertension 2 02/04/19 02/07/19 decreased Back to baseline No No No  Hypertension 1 02/07/19  ongoing At Baseline No No No  Decreased WBC 1 02/04/19  ongoing  Yes No No  Breast Pain (tightness)  2 01/25/19  ongoing R/t Expanders/ valium No No No  Pruritis generalized 1 11/08/18 12/08/18 resolved Recurred intermittently, then resolved No No No  Rash, groin 1 11/15/18 11/29/18 resolved ketoconazole No No No  Numbness bilat arms, intermittent 1 12/08/18  ongoing X 2 months No No No  Arthralgias bilat legs 1 10/01/18  ongoing At Baseline No No No    Foye Spurling, BSN, RN Clinical Research Nurse 02/07/2019 4:34 PM

## 2019-02-07 NOTE — Patient Instructions (Signed)

## 2019-02-08 ENCOUNTER — Telehealth: Payer: Self-pay | Admitting: Oncology

## 2019-02-08 NOTE — Telephone Encounter (Signed)
I left a message regarding schedule  

## 2019-02-09 MED ORDER — INV-RIBOCICLIB 200 MG TAB NATALEE TRIO003 STUDY: 400.0000 mg | ORAL_TABLET | Freq: Every day | ORAL | 0 refills | Status: DC

## 2019-02-11 ENCOUNTER — Ambulatory Visit (INDEPENDENT_AMBULATORY_CARE_PROVIDER_SITE_OTHER): Payer: 59 | Admitting: Surgical

## 2019-02-11 ENCOUNTER — Encounter: Payer: Self-pay | Admitting: Surgical

## 2019-02-11 ENCOUNTER — Other Ambulatory Visit: Payer: Self-pay

## 2019-02-11 VITALS — BP 138/84 | HR 78 | Temp 97.3°F | Ht 66.0 in | Wt 270.6 lb

## 2019-02-11 DIAGNOSIS — C50212 Malignant neoplasm of upper-inner quadrant of left female breast: Secondary | ICD-10-CM

## 2019-02-11 DIAGNOSIS — Z9012 Acquired absence of left breast and nipple: Secondary | ICD-10-CM

## 2019-02-11 DIAGNOSIS — Z170 Estrogen receptor positive status [ER+]: Secondary | ICD-10-CM

## 2019-02-11 NOTE — Progress Notes (Signed)
   Subjective:     Patient ID: Mackenzie [name], female    DOB: 1970-11-28, 48 y.o.   MRN: [id number]  Chief Complaint  Patient presents with  . Follow-up    1 week    HPI: The patient is a 48 y.o. female here for follow-up on left breast reconstruction.  She has a left latissimus flap.  She is doing well overall.  She is taking the Valium as needed.  She has not had any pain after her last fill.  She is getting near the desired size.  It does not appear that there is a leak based on previous photos but the expander is slightly more lateral than expected.  She is tolerating expansion well.  No fevers, chills, nausea, vomiting.  She is scheduled for her exchange surgery on 03/17/2019.  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   Review of Systems  Constitutional: Negative for chills, diaphoresis, fever, malaise/fatigue and weight loss.       + activity change   Respiratory: Negative.   Cardiovascular: Negative.   Genitourinary: Negative.   Musculoskeletal: Positive for myalgias.  Skin: Negative for itching and rash.  Neurological: Negative.   Psychiatric/Behavioral: Negative.     Objective:   Vital Signs BP 138/84 (BP Location: Right Arm, Patient Position: Sitting, Cuff Size: Large)   Pulse 78   Temp (!) 97.3 F (36.3 C) (Temporal)   Ht 5\' 6"  (1.676 m)   Wt 270 lb 9.6 oz (122.7 kg)   SpO2 100%   BMI 43.68 kg/m  Vital Signs and Nursing Note Reviewed Chaperone present Physical Exam  Constitutional: She is oriented to person, place, and time and well-developed, well-nourished, and in no distress.  HENT:  Head: Normocephalic and atraumatic.  Cardiovascular: Normal rate.  Pulmonary/Chest: Effort normal.    Musculoskeletal: Normal range of motion.  Neurological: She is alert and oriented to person, place, and time. Gait normal.  Skin: Skin is warm and dry. No rash noted. She is not diaphoretic. No erythema. No pallor.  Psychiatric: Mood  and affect normal.       Assessment/Plan:     ICD-10-CM   1. Acquired absence of left breast  Z90.12   2. Malignant neoplasm of upper-inner quadrant of left breast in female, estrogen receptor positive ([place])  C50.212    Z17.0     Based on photos at previous visit and this current visit, it does not appear there is a leak, but the expander is slightly off to the lateral side of her left breast.   She is almost at her desired size and she understands the limits of the current expander. She is pleased so far with her progress.  Follow up in 2 weeks for additional fill.  We placed injectable saline in the Expander using a sterile technique: Left:70cc for a total of820/ 455cc. We are nearing max capacity for this expander. Patient would like 1 more fill.  Carola Rhine Naliya Gish, PA-C 02/11/2019, 1:16 PM

## 2019-02-18 ENCOUNTER — Telehealth: Payer: Self-pay | Admitting: *Deleted

## 2019-02-18 NOTE — Telephone Encounter (Signed)
Patient called to report her breast reconstruction surgery has been moved up to 03/03/19 instead of 03/14/19.  Informed patient research nurse will discuss with Dr. Jana Hakim and call her back on Monday with updated instructions on when to hold Ribociclib pre and post surgery.  He is out of office this afternoon. Patient verbalized understanding.  Thanked patient for the update and will call her again on Monday.  Foye Spurling, BSN, RN Clinical Research Nurse 02/18/2019 12:38 PM

## 2019-02-21 ENCOUNTER — Ambulatory Visit (INDEPENDENT_AMBULATORY_CARE_PROVIDER_SITE_OTHER): Payer: 59 | Admitting: Surgical

## 2019-02-21 ENCOUNTER — Other Ambulatory Visit: Payer: Self-pay

## 2019-02-21 ENCOUNTER — Telehealth: Payer: Self-pay | Admitting: *Deleted

## 2019-02-21 ENCOUNTER — Encounter: Payer: Self-pay | Admitting: Surgical

## 2019-02-21 VITALS — BP 150/90 | HR 78 | Temp 97.7°F | Ht 66.0 in | Wt 269.4 lb

## 2019-02-21 DIAGNOSIS — Z170 Estrogen receptor positive status [ER+]: Secondary | ICD-10-CM

## 2019-02-21 DIAGNOSIS — Z9012 Acquired absence of left breast and nipple: Secondary | ICD-10-CM

## 2019-02-21 DIAGNOSIS — C50212 Malignant neoplasm of upper-inner quadrant of left female breast: Secondary | ICD-10-CM

## 2019-02-21 NOTE — Progress Notes (Signed)
   Subjective:     Mackenzie [name] ID: Mackenzie [name], female    DOB: 11-22-1970, 48 y.o.   MRN: [id number]  Chief Complaint  Mackenzie [name] presents with  . Follow-up    and fill    HPI: The Mackenzie [name] is a 48 y.o. female here for follow-up on left breast reconstruction.  She is doing well. No complaints. No sign of a leak based on previous visit. Expander may be sitting more lateral than usual.  Incisions c/d/i. Latissimus flap with good capillary refill.  No fevers, chills, n/v.   Review of Systems  Constitutional: Negative for chills, diaphoresis, fever and malaise/fatigue.  Respiratory: Negative.   Cardiovascular: Negative.   Genitourinary: Negative.   Musculoskeletal: Positive for myalgias.  Skin: Negative for itching and rash.  Neurological: Positive for sensory change.     Objective:   Vital Signs BP (!) 150/90 (BP Location: Right Arm, Mackenzie [name] Position: Sitting, Cuff Size: Large)   Pulse 78   Temp 97.7 F (36.5 C) (Temporal)   Ht 5\' 6"  (1.676 m)   Wt 269 lb 6.4 oz (122.2 kg)   SpO2 99%   BMI 43.48 kg/m  Vital Signs and Nursing Note Reviewed Chaperone present Physical Exam  Constitutional: She is oriented to person, place, and time and well-developed, well-nourished, and in no distress.  HENT:  Head: Normocephalic and atraumatic.  Cardiovascular: Normal rate.  Pulmonary/Chest: Effort normal.    Musculoskeletal: Normal range of motion.  Neurological: She is alert and oriented to person, place, and time. Gait normal.  Skin: Skin is warm and dry. No rash noted. She is not diaphoretic. No erythema. No pallor.  Psychiatric: Mood and affect normal.      Assessment/Plan:     ICD-10-CM   1. Acquired absence of left breast  Z90.12   2. Malignant neoplasm of upper-inner quadrant of left breast in female, estrogen receptor positive ([place])  C50.212    Z17.0     We placed injectable saline in the Expander using a sterile technique: Left:50cc for a total of870/ 455cc.  Mackenzie [name] happy with current size. We will no longer fill.   Follow up on Friday for Pre op exam.   Carola Rhine Tora Prunty, PA-C 02/21/2019, 11:07 AM

## 2019-02-21 NOTE — Telephone Encounter (Signed)
[name]; Notified Dr. Jana Hakim that patient's breast reconstruction surgery has been moved up to 03/03/19 and asked for instructions on holding Ribociclib pre and post surgery.  Dr. Jana Hakim ordered for patient to stop taking Ribociclib after today's dose. She can resume approximately 10 days after surgery once she has labs and has been assessed in clinic.  He instructed for patient to have labs and see [name], NP on 03/14/19 to make sure she has healed well enough to resume Ribociclib.  Patient may continue on Letrozole and Goserelin injections without interruption.  Called patient and left VM with above instructions. Asked her to call research nurse back to confirm she received these instructions. Scheduling message sent to schedule patient on 03/14/19.  Foye Spurling, BSN, RN Clinical Research Nurse 02/21/2019 11:20 AM

## 2019-02-21 NOTE — Telephone Encounter (Signed)
Patient returned call and confirmed she received instructions regarding holding Ribociclib after today and to not resume until seen in clinic again post op.  Informed patient the appointment with Mendel Ryder is on 10/6, not 10/5 Mendel Ryder is out of office on 10/5), at 8:30 am. Patient also understands to continue Letrozole daily and keep appointment for Goserelin on 03/07/19.  Reminded patient to complete medication diary daily accordingly. Patient verbalized understanding of these instructions. Thanked patient for calling back and encouraged her to call if any questions or concerns prior to next appointment.  Foye Spurling, BSN, RN Clinical Research Nurse 02/21/2019 3:11 PM

## 2019-02-25 ENCOUNTER — Ambulatory Visit (INDEPENDENT_AMBULATORY_CARE_PROVIDER_SITE_OTHER): Payer: 59 | Admitting: Plastic Surgery

## 2019-02-25 ENCOUNTER — Encounter: Payer: Self-pay | Admitting: Plastic Surgery

## 2019-02-25 ENCOUNTER — Other Ambulatory Visit: Payer: Self-pay

## 2019-02-25 VITALS — BP 136/96 | HR 64 | Temp 97.9°F | Ht 66.0 in | Wt 260.0 lb

## 2019-02-25 DIAGNOSIS — C50912 Malignant neoplasm of unspecified site of left female breast: Secondary | ICD-10-CM

## 2019-02-25 DIAGNOSIS — C773 Secondary and unspecified malignant neoplasm of axilla and upper limb lymph nodes: Secondary | ICD-10-CM

## 2019-02-25 DIAGNOSIS — C50212 Malignant neoplasm of upper-inner quadrant of left female breast: Secondary | ICD-10-CM

## 2019-02-25 DIAGNOSIS — Z1379 Encounter for other screening for genetic and chromosomal anomalies: Secondary | ICD-10-CM

## 2019-02-25 DIAGNOSIS — Z170 Estrogen receptor positive status [ER+]: Secondary | ICD-10-CM

## 2019-02-25 DIAGNOSIS — Z9012 Acquired absence of left breast and nipple: Secondary | ICD-10-CM

## 2019-02-25 MED ORDER — ONDANSETRON HCL 4 MG PO TABS: 4.0000 mg | ORAL_TABLET | Freq: Three times a day (TID) | ORAL | 0 refills | Status: AC | PRN

## 2019-02-25 MED ORDER — DIAZEPAM 2 MG PO TABS: 2.0000 mg | ORAL_TABLET | Freq: Four times a day (QID) | ORAL | 0 refills | Status: AC | PRN

## 2019-02-25 MED ORDER — HYDROCODONE-ACETAMINOPHEN 5-325 MG PO TABS: 1.0000 | ORAL_TABLET | Freq: Four times a day (QID) | ORAL | 0 refills | Status: DC | PRN

## 2019-02-25 MED ORDER — CEPHALEXIN 500 MG PO CAPS: 500.0000 mg | ORAL_CAPSULE | Freq: Four times a day (QID) | ORAL | 0 refills | Status: AC

## 2019-02-25 MED FILL — ONDANSETRON HCL 4 MG TABLET: 4 | 5 days supply | Qty: 15 | Fill #0

## 2019-02-25 MED FILL — diazePAM 2 MG TABS: 2 | 15 days supply | Qty: 30 | Fill #0

## 2019-02-25 MED FILL — CEPHALEXIN 500 MG CAPSULE: 500 | 5 days supply | Qty: 20 | Fill #0

## 2019-02-25 MED FILL — HYDROCODON-APAP 5-325: 5-325 | 7 days supply | Qty: 30 | Fill #0

## 2019-02-25 NOTE — Progress Notes (Addendum)
Patient ID: Mackenzie [name], female    DOB: 09/10/1970, 48 y.o.   MRN: [id number]   Chief Complaint  Patient presents with  . Breast Problem    The patient is a 48 year old female here for her preop history and physical for breast reconstruction.  She had left-sided breast cancer that was treated with a mastectomy and radiation.  She had severe skin damage from the radiation.  She required a latissimus muscle flap.  She has an expander in place.  We suspect that there may be a leak of that left-sided expander.  We are planning on doing an exchange of the expander to a silicone implant on the left and a right mastopexy reduction.  Patient is doing well.  There is been no recent illnesses.  The incisions are healing nicely.  When we expand she seems to respond but at the next visit seems to be somewhat deflated but not completely.   Review of Systems  Constitutional: Negative for activity change.  Eyes: Negative.   Respiratory: Negative.  Negative for chest tightness and shortness of breath.   Cardiovascular: Negative.   Endocrine: Negative.   Genitourinary: Negative.   Musculoskeletal: Negative.   Neurological: Negative.   Hematological: Negative.   Psychiatric/Behavioral: Negative.     Past Medical History:  Diagnosis Date  . Abnormal glucose 2018  . 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 Allergic rhinitis 2012  . Anemia 01/27/2017   prior to starting chemotherapy  . Breast cancer ([place]) 01/14/2017   Left breast  . 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 Diabetes mellitus 2012   GDM  . Hot flashes 03/2017  . Hyperlipidemia 03/20/2016  . Hypertension 2012   denies  . Morbid obesity with body mass index (BMI) of 40.0 to 49.9 ([place]) 09/17/2017  . NCGS (non-celiac gluten sensitivity)   . Pre-diabetes   . Seasonal allergies 2012   seasonal allergies causes allergic rhinitis and itchy, dry eyes per pt  . Vitamin D deficiency  05/2015    Past Surgical History:  Procedure Laterality Date  . BREAST RECONSTRUCTION WITH PLACEMENT OF TISSUE EXPANDER AND FLEX HD (ACELLULAR HYDRATED DERMIS) Left 08/27/2017   Procedure: LEFT BREAST RECONSTRUCTION WITH PLACEMENT OF TISSUE EXPANDER AND FLEX HD;  Surgeon: Wallace Going, DO;  Location: [facility];  Service: Plastics;  Laterality: Left;  . CESAREAN SECTION     x2  . LATISSIMUS FLAP TO BREAST Left 11/03/2018  . LATISSIMUS FLAP TO BREAST Left 11/03/2018   Procedure: LATISSIMUS FLAP TO LEFT BREAST;  Surgeon: Wallace Going, DO;  Location: [facility];  Service: Plastics;  Laterality: Left;  Marland Kitchen MASTECTOMY MODIFIED RADICAL Left 08/27/2017  . MASTECTOMY MODIFIED RADICAL Left 08/27/2017   Procedure: LEFT MODIFIED RADICAL MASTECTOMY;  Surgeon: Erroll Luna, MD;  Location: [facility];  Service: General;  Laterality: Left;  . PORT-A-CATH REMOVAL Right 08/27/2017   Procedure: REMOVAL PORT-A-CATH RIGHT CHEST;  Surgeon: Erroll Luna, MD;  Location: [facility];  Service: General;  Laterality: Right;  . PORTACATH PLACEMENT Right 01/28/2017   Procedure: INSERTION PORT-A-CATH WITH ULTRASOUND;  Surgeon: Erroll Luna, MD;  Location: [facility];  Service: General;  Laterality: Right;  . TISSUE EXPANDER PLACEMENT Left 11/03/2018   Procedure: PLACEMENT OF TISSUE EXPANDER LEFT BREAST;  Surgeon: Wallace Going, DO;  Location: [facility];  Service: Plastics;  Laterality: Left;  Total case time is 3.5 hours  . TUBAL LIGATION Bilateral 01/21/2011  Current Outpatient Medications:  .  acetaminophen (TYLENOL) 500 MG tablet, Take 1,000 mg by mouth every 6 (six) hours as needed (for pain/headaches.). , Disp: , Rfl:  .  B Complex Vitamins (VITAMIN B COMPLEX PO), Take 1 capsule by mouth 2 (two) times daily. , Disp: , Rfl:  .  budesonide-formoterol (SYMBICORT) 80-4.5 MCG/ACT inhaler, Inhale 2 puffs into the lungs every morning. , Disp: , Rfl:  .  Calcium Carb-Cholecalciferol (CALCIUM 600+D3  PO), Take 1 tablet by mouth 2 (two) times daily. , Disp: , Rfl:  .  cetirizine (ZYRTEC) 10 MG tablet, Take 10 mg by mouth daily. , Disp: , Rfl:  .  Cholecalciferol (VITAMIN D-3) 5000 units TABS, Take 5,000 Units by mouth 2 (two) times daily. , Disp: , Rfl:  .  diazepam (VALIUM) 2 MG tablet, Take 1 tablet (2 mg total) by mouth every 8 (eight) hours as needed for muscle spasms., Disp: 30 tablet, Rfl: 0 .  gabapentin (NEURONTIN) 300 MG capsule, Take 1 capsule (300 mg total) by mouth at bedtime. (Patient taking differently: Take 300 mg by mouth every other day. ), Disp: 90 capsule, Rfl: 4 .  hydroxypropyl methylcellulose / hypromellose (ISOPTO TEARS / GONIOVISC) 2.5 % ophthalmic solution, Place 1 drop into both eyes daily as needed for dry eyes., Disp: , Rfl:  .  ibuprofen (ADVIL,MOTRIN) 200 MG tablet, Take 800 mg by mouth every 8 (eight) hours as needed for mild pain (for pain.). , Disp: , Rfl:  .  Investigational ribociclib (KISQALI) 200 MG tablet [name], Take 2 tablets (400 mg total) by mouth daily. Take 2 tablets (400mg  total) by mouth daily on days 1-21.  Repeat every 28 days., Disp: 225 tablet, Rfl: 0 .  letrozole (FEMARA) 2.5 MG tablet, Take 1 tablet (2.5 mg total) by mouth daily., Disp: 90 tablet, Rfl: 4 .  loratadine (CLARITIN) 10 MG tablet, Take 1 tablet (10 mg total) by mouth daily. (Patient taking differently: Take 10 mg by mouth daily as needed for allergies. ), Disp: 90 tablet, Rfl: 2 .  Multiple Vitamin (MULTIVITAMIN WITH MINERALS) TABS tablet, Take 1 tablet by mouth daily. , Disp: , Rfl:  .  Omega-3 Fatty Acids (FISH OIL) 1000 MG CAPS, Take 1,000 mg by mouth 2 (two) times daily. , Disp: , Rfl:  .  phenylephrine (SUDAFED PE) 10 MG TABS tablet, Take 10 mg by mouth every 4 (four) hours as needed., Disp: , Rfl:  .  Probiotic Product (PROBIOTIC DAILY PO), Take 1 capsule by mouth 2 (two) times daily. , Disp: , Rfl:  .  Turmeric 450 MG CAPS, Take 450 mg by mouth 2 (two) times daily., Disp:  , Rfl:  .  ZINC OXIDE PO, Take 1 tablet by mouth 2 (two) times a day., Disp: , Rfl:  No current facility-administered medications for this visit.   Facility-Administered Medications Ordered in Other Visits:  .  sodium chloride flush (NS) 0.9 % injection 10 mL, 10 mL, Intravenous, PRN, Magrinat, [name], MD, 10 mL at 03/10/17 1239   Objective:   Vitals:   02/25/19 1116  BP: (!) 136/96  Pulse: 64  Temp: 97.9 F (36.6 C)  SpO2: 99%    Physical Exam Constitutional:      Appearance: Normal appearance.  Eyes:     Extraocular Movements: Extraocular movements intact.  Neck:     Musculoskeletal: Normal range of motion.  Cardiovascular:     Rate and Rhythm: Normal rate.     Pulses: Normal pulses.  Pulmonary:     Effort: Pulmonary effort is normal. No respiratory distress.  Abdominal:     General: Abdomen is flat. There is no distension.  Neurological:     General: No focal deficit present.     Mental Status: She is alert and oriented to person, place, and time.  Psychiatric:        Mood and Affect: Mood normal.        Behavior: Behavior normal.        Thought Content: Thought content normal.     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, left ([facility])  Malignant neoplasm of upper-inner quadrant of left breast in female, estrogen receptor positive ([place])  The risks that can be encountered with and after placement of a breast implant and reduction were discussed and include the following but not limited to these: bleeding, infection, delayed healing, anesthesia risks, skin sensation changes, injury to structures including nerves, blood vessels, and muscles which may be temporary or permanent, allergies to tape, suture materials and glues, blood products, topical preparations or injected agents, skin contour irregularities, skin discoloration and swelling, deep vein thrombosis, cardiac and pulmonary  complications, pain, which may persist, fluid accumulation, wrinkling of the skin over the expander, changes in nipple or breast sensation, expander leakage or rupture, faulty position of the expander, persistent pain, formation of tight scar tissue around the expander (capsular contracture), possible need for revisional surgery or staged procedures.  Prescriptions sent to pharmacy. We placed injectable saline in the Expander using a sterile technique: Left: 100 cc for a total of 970 ??? / 455 cc  We discussed options for what we will do during surgery if there is a leak of the left expander.  If I cannot get an implant in that is a reasonable size then we will go with placing a new expander.  If we can go with a reasonable sized implant then we will put that in.  Either way will do the right mastopexy reduction.  [place], DO

## 2019-02-28 ENCOUNTER — Other Ambulatory Visit (HOSPITAL_COMMUNITY)
Admission: RE | Admit: 2019-02-28 | Discharge: 2019-02-28 | Disposition: A | Discharge status: Home / Self Care | Payer: 59 | Source: Ambulatory Visit | Attending: Plastic Surgery | Admitting: Plastic Surgery

## 2019-02-28 ENCOUNTER — Other Ambulatory Visit: Payer: Self-pay

## 2019-02-28 ENCOUNTER — Encounter (HOSPITAL_BASED_OUTPATIENT_CLINIC_OR_DEPARTMENT_OTHER): Payer: Self-pay | Admitting: *Deleted

## 2019-02-28 DIAGNOSIS — Z01812 Encounter for preprocedural laboratory examination: Secondary | ICD-10-CM | POA: Diagnosis not present

## 2019-02-28 DIAGNOSIS — Z20828 Contact with and (suspected) exposure to other viral communicable diseases: Secondary | ICD-10-CM | POA: Diagnosis not present

## 2019-02-28 MED FILL — SYMBICORT 80-4.5 MCG INH: 80-4.5 | 30 days supply | Qty: 10 | Fill #0

## 2019-03-01 ENCOUNTER — Ambulatory Visit (INDEPENDENT_AMBULATORY_CARE_PROVIDER_SITE_OTHER): Payer: 59 | Admitting: Plastic Surgery

## 2019-03-01 ENCOUNTER — Encounter: Payer: Self-pay | Admitting: Plastic Surgery

## 2019-03-01 DIAGNOSIS — C773 Secondary and unspecified malignant neoplasm of axilla and upper limb lymph nodes: Secondary | ICD-10-CM

## 2019-03-01 DIAGNOSIS — C50812 Malignant neoplasm of overlapping sites of left female breast: Secondary | ICD-10-CM

## 2019-03-01 DIAGNOSIS — Z170 Estrogen receptor positive status [ER+]: Secondary | ICD-10-CM

## 2019-03-01 DIAGNOSIS — C50912 Malignant neoplasm of unspecified site of left female breast: Secondary | ICD-10-CM

## 2019-03-01 DIAGNOSIS — C50212 Malignant neoplasm of upper-inner quadrant of left female breast: Secondary | ICD-10-CM

## 2019-03-01 NOTE — Progress Notes (Addendum)
No sign of infection.  Slow leak noted since last visit.  We placed injectable saline in the Expander using a sterile technique: Left: 140 cc

## 2019-03-01 NOTE — H&P (View-Only) (Signed)
No sign of infection.  Slow leak noted since last visit.  We placed injectable saline in the Expander using a sterile technique: Left: 140 cc

## 2019-03-02 LAB — NOVEL CORONAVIRUS, NAA (HOSP ORDER, SEND-OUT TO REF LAB; TAT 18-24 HRS): SARS-CoV-2, NAA: NOT DETECTED

## 2019-03-03 ENCOUNTER — Ambulatory Visit (HOSPITAL_BASED_OUTPATIENT_CLINIC_OR_DEPARTMENT_OTHER)
Admission: RE | Admit: 2019-03-03 | Discharge: 2019-03-03 | Disposition: A | Discharge status: Home / Self Care | Payer: 59 | Attending: Plastic Surgery | Admitting: Plastic Surgery

## 2019-03-03 ENCOUNTER — Ambulatory Visit (HOSPITAL_BASED_OUTPATIENT_CLINIC_OR_DEPARTMENT_OTHER): Payer: 59 | Admitting: Anesthesiology

## 2019-03-03 ENCOUNTER — Encounter (HOSPITAL_BASED_OUTPATIENT_CLINIC_OR_DEPARTMENT_OTHER)
Admission: RE | Disposition: A | Discharge status: Home / Self Care | Payer: Self-pay | Source: Home / Self Care | Attending: Plastic Surgery

## 2019-03-03 ENCOUNTER — Other Ambulatory Visit: Payer: Self-pay

## 2019-03-03 ENCOUNTER — Encounter (HOSPITAL_BASED_OUTPATIENT_CLINIC_OR_DEPARTMENT_OTHER): Payer: Self-pay | Admitting: Anesthesiology

## 2019-03-03 DIAGNOSIS — Z888 Allergy status to other drugs, medicaments and biological substances status: Secondary | ICD-10-CM | POA: Diagnosis not present

## 2019-03-03 DIAGNOSIS — E785 Hyperlipidemia, unspecified: Secondary | ICD-10-CM | POA: Diagnosis not present

## 2019-03-03 DIAGNOSIS — T85898A Other specified complication of other internal prosthetic devices, implants and grafts, initial encounter: Secondary | ICD-10-CM | POA: Diagnosis not present

## 2019-03-03 DIAGNOSIS — I10 Essential (primary) hypertension: Secondary | ICD-10-CM | POA: Insufficient documentation

## 2019-03-03 DIAGNOSIS — N651 Disproportion of reconstructed breast: Secondary | ICD-10-CM | POA: Diagnosis not present

## 2019-03-03 DIAGNOSIS — R7303 Prediabetes: Secondary | ICD-10-CM | POA: Insufficient documentation

## 2019-03-03 DIAGNOSIS — Z6841 Body Mass Index (BMI) 40.0 and over, adult: Secondary | ICD-10-CM | POA: Diagnosis not present

## 2019-03-03 DIAGNOSIS — Z7951 Long term (current) use of inhaled steroids: Secondary | ICD-10-CM | POA: Insufficient documentation

## 2019-03-03 DIAGNOSIS — N6489 Other specified disorders of breast: Secondary | ICD-10-CM | POA: Diagnosis not present

## 2019-03-03 DIAGNOSIS — C50212 Malignant neoplasm of upper-inner quadrant of left female breast: Secondary | ICD-10-CM | POA: Insufficient documentation

## 2019-03-03 DIAGNOSIS — Z79899 Other long term (current) drug therapy: Secondary | ICD-10-CM | POA: Insufficient documentation

## 2019-03-03 DIAGNOSIS — T8543XA Leakage of breast prosthesis and implant, initial encounter: Secondary | ICD-10-CM | POA: Diagnosis not present

## 2019-03-03 DIAGNOSIS — Z170 Estrogen receptor positive status [ER+]: Secondary | ICD-10-CM | POA: Diagnosis not present

## 2019-03-03 DIAGNOSIS — Z9013 Acquired absence of bilateral breasts and nipples: Secondary | ICD-10-CM | POA: Diagnosis not present

## 2019-03-03 DIAGNOSIS — Z853 Personal history of malignant neoplasm of breast: Secondary | ICD-10-CM | POA: Diagnosis not present

## 2019-03-03 DIAGNOSIS — Y834 Other reconstructive surgery as the cause of abnormal reaction of the patient, or of later complication, without mention of misadventure at the time of the procedure: Secondary | ICD-10-CM | POA: Diagnosis not present

## 2019-03-03 DIAGNOSIS — N6011 Diffuse cystic mastopathy of right breast: Secondary | ICD-10-CM | POA: Diagnosis not present

## 2019-03-03 HISTORY — PX: MASTOPEXY: SHX5358

## 2019-03-03 HISTORY — PX: REMOVAL OF TISSUE EXPANDER AND PLACEMENT OF IMPLANT: SHX6457

## 2019-03-03 SURGERY — REMOVAL, TISSUE EXPANDER, BREAST, WITH IMPLANT INSERTION
Anesthesia: General | Site: Breast | Laterality: Right

## 2019-03-03 MED ORDER — ONDANSETRON HCL 4 MG/2ML IJ SOLN: 4.0000 mg | Freq: Once | INTRAMUSCULAR | Status: DC | PRN

## 2019-03-03 MED ORDER — FENTANYL CITRATE (PF) 100 MCG/2ML IJ SOLN
INTRAMUSCULAR | Status: AC
  Filled 2019-03-03: qty 2

## 2019-03-03 MED ORDER — DEXAMETHASONE SODIUM PHOSPHATE 10 MG/ML IJ SOLN
INTRAMUSCULAR | Status: DC | PRN
  Administered 2019-03-03: 10 mg via INTRAVENOUS

## 2019-03-03 MED ORDER — SODIUM CHLORIDE 0.9 % IV SOLN
INTRAVENOUS | Status: AC
  Filled 2019-03-03: qty 500000

## 2019-03-03 MED ORDER — CHLORHEXIDINE GLUCONATE CLOTH 2 % EX PADS: 6.0000 | MEDICATED_PAD | Freq: Once | CUTANEOUS | Status: DC

## 2019-03-03 MED ORDER — LACTATED RINGERS IV SOLN
INTRAVENOUS | Status: DC
  Administered 2019-03-03 (×2): via INTRAVENOUS

## 2019-03-03 MED ORDER — CEFAZOLIN SODIUM-DEXTROSE 2-4 GM/100ML-% IV SOLN
INTRAVENOUS | Status: AC
  Filled 2019-03-03: qty 100

## 2019-03-03 MED ORDER — SCOPOLAMINE 1 MG/3DAYS TD PT72
MEDICATED_PATCH | TRANSDERMAL | Status: AC
  Filled 2019-03-03: qty 1

## 2019-03-03 MED ORDER — BUPIVACAINE-EPINEPHRINE (PF) 0.5% -1:200000 IJ SOLN
INTRAMUSCULAR | Status: AC
  Filled 2019-03-03: qty 30

## 2019-03-03 MED ORDER — FENTANYL CITRATE (PF) 100 MCG/2ML IJ SOLN
25.0000 ug | INTRAMUSCULAR | Status: DC | PRN
  Administered 2019-03-03: 25 ug via INTRAVENOUS
  Administered 2019-03-03: 50 ug via INTRAVENOUS
  Administered 2019-03-03: 25 ug via INTRAVENOUS

## 2019-03-03 MED ORDER — FENTANYL CITRATE (PF) 100 MCG/2ML IJ SOLN: 25.0000 ug | INTRAMUSCULAR | Status: DC | PRN

## 2019-03-03 MED ORDER — CEFAZOLIN SODIUM-DEXTROSE 1-4 GM/50ML-% IV SOLN
INTRAVENOUS | Status: AC
  Filled 2019-03-03: qty 50

## 2019-03-03 MED ORDER — PROPOFOL 500 MG/50ML IV EMUL
INTRAVENOUS | Status: AC
  Filled 2019-03-03: qty 50

## 2019-03-03 MED ORDER — ACETAMINOPHEN 650 MG RE SUPP: 650.0000 mg | RECTAL | Status: DC | PRN

## 2019-03-03 MED ORDER — SUGAMMADEX SODIUM 500 MG/5ML IV SOLN
INTRAVENOUS | Status: DC | PRN
  Administered 2019-03-03: 500 mg via INTRAVENOUS

## 2019-03-03 MED ORDER — EPHEDRINE 5 MG/ML INJ
INTRAVENOUS | Status: AC
  Filled 2019-03-03: qty 10

## 2019-03-03 MED ORDER — SCOPOLAMINE 1 MG/3DAYS TD PT72
1.0000 | MEDICATED_PATCH | Freq: Once | TRANSDERMAL | Status: DC
  Administered 2019-03-03: 1.5 mg via TRANSDERMAL

## 2019-03-03 MED ORDER — PROPOFOL 10 MG/ML IV BOLUS
INTRAVENOUS | Status: DC | PRN
  Administered 2019-03-03: 160 mg via INTRAVENOUS

## 2019-03-03 MED ORDER — SODIUM CHLORIDE 0.9 % IV SOLN
INTRAVENOUS | Status: DC | PRN
  Administered 2019-03-03: 30 ug/min via INTRAVENOUS

## 2019-03-03 MED ORDER — MIDAZOLAM HCL 2 MG/2ML IJ SOLN
1.0000 mg | INTRAMUSCULAR | Status: DC | PRN
  Administered 2019-03-03: 2 mg via INTRAVENOUS

## 2019-03-03 MED ORDER — SODIUM CHLORIDE 0.9% FLUSH: 3.0000 mL | INTRAVENOUS | Status: DC | PRN

## 2019-03-03 MED ORDER — LIDOCAINE-EPINEPHRINE 1 %-1:100000 IJ SOLN
INTRAMUSCULAR | Status: DC | PRN
  Administered 2019-03-03: 17 mL

## 2019-03-03 MED ORDER — SUCCINYLCHOLINE CHLORIDE 200 MG/10ML IV SOSY
PREFILLED_SYRINGE | INTRAVENOUS | Status: AC
  Filled 2019-03-03: qty 10

## 2019-03-03 MED ORDER — ONDANSETRON HCL 4 MG/2ML IJ SOLN
INTRAMUSCULAR | Status: DC | PRN
  Administered 2019-03-03: 4 mg via INTRAVENOUS

## 2019-03-03 MED ORDER — ACETAMINOPHEN 325 MG PO TABS: 650.0000 mg | ORAL_TABLET | ORAL | Status: DC | PRN

## 2019-03-03 MED ORDER — OXYCODONE HCL 5 MG PO TABS
ORAL_TABLET | ORAL | Status: AC
  Filled 2019-03-03: qty 1

## 2019-03-03 MED ORDER — SODIUM CHLORIDE 0.9 % IV SOLN: 250.0000 mL | INTRAVENOUS | Status: DC | PRN

## 2019-03-03 MED ORDER — SODIUM CHLORIDE 0.9% FLUSH: 3.0000 mL | Freq: Two times a day (BID) | INTRAVENOUS | Status: DC

## 2019-03-03 MED ORDER — MEPERIDINE HCL 25 MG/ML IJ SOLN: 6.2500 mg | INTRAMUSCULAR | Status: DC | PRN

## 2019-03-03 MED ORDER — PHENYLEPHRINE 40 MCG/ML (10ML) SYRINGE FOR IV PUSH (FOR BLOOD PRESSURE SUPPORT)
PREFILLED_SYRINGE | INTRAVENOUS | Status: AC
  Filled 2019-03-03: qty 10

## 2019-03-03 MED ORDER — PHENYLEPHRINE HCL (PRESSORS) 10 MG/ML IV SOLN
INTRAVENOUS | Status: DC | PRN
  Administered 2019-03-03: 40 ug via INTRAVENOUS
  Administered 2019-03-03: 80 ug via INTRAVENOUS
  Administered 2019-03-03: 40 ug via INTRAVENOUS

## 2019-03-03 MED ORDER — LIDOCAINE 2% (20 MG/ML) 5 ML SYRINGE
INTRAMUSCULAR | Status: AC
  Filled 2019-03-03: qty 5

## 2019-03-03 MED ORDER — OXYCODONE HCL 5 MG PO TABS
5.0000 mg | ORAL_TABLET | ORAL | Status: DC | PRN
  Administered 2019-03-03: 5 mg via ORAL

## 2019-03-03 MED ORDER — ONDANSETRON HCL 4 MG/2ML IJ SOLN
INTRAMUSCULAR | Status: AC
  Filled 2019-03-03: qty 2

## 2019-03-03 MED ORDER — DEXTROSE 5 % IV SOLN
3.0000 g | INTRAVENOUS | Status: AC
  Administered 2019-03-03: 3 g via INTRAVENOUS

## 2019-03-03 MED ORDER — LIDOCAINE HCL (CARDIAC) PF 100 MG/5ML IV SOSY
PREFILLED_SYRINGE | INTRAVENOUS | Status: DC | PRN
  Administered 2019-03-03: 60 mg via INTRAVENOUS

## 2019-03-03 MED ORDER — ROCURONIUM BROMIDE 100 MG/10ML IV SOLN
INTRAVENOUS | Status: DC | PRN
  Administered 2019-03-03: 10 mg via INTRAVENOUS
  Administered 2019-03-03: 60 mg via INTRAVENOUS

## 2019-03-03 MED ORDER — SODIUM CHLORIDE 0.9 % IV SOLN
INTRAVENOUS | Status: DC | PRN
  Administered 2019-03-03: 500 mL

## 2019-03-03 MED ORDER — FENTANYL CITRATE (PF) 100 MCG/2ML IJ SOLN
50.0000 ug | INTRAMUSCULAR | Status: AC | PRN
  Administered 2019-03-03: 100 ug via INTRAVENOUS
  Administered 2019-03-03 (×2): 50 ug via INTRAVENOUS

## 2019-03-03 MED ORDER — EPHEDRINE SULFATE 50 MG/ML IJ SOLN
INTRAMUSCULAR | Status: DC | PRN
  Administered 2019-03-03: 10 mg via INTRAVENOUS

## 2019-03-03 SURGICAL SUPPLY — 81 items
ADH SKN CLS APL DERMABOND .7 (GAUZE/BANDAGES/DRESSINGS) ×4
APL PRP STRL LF DISP 70% ISPRP (MISCELLANEOUS) ×4
BAG DECANTER FOR FLEXI CONT (MISCELLANEOUS) ×3 IMPLANT
BINDER BREAST 3XL (GAUZE/BANDAGES/DRESSINGS) ×2 IMPLANT
BINDER BREAST LRG (GAUZE/BANDAGES/DRESSINGS) IMPLANT
BINDER BREAST MEDIUM (GAUZE/BANDAGES/DRESSINGS) IMPLANT
BINDER BREAST XLRG (GAUZE/BANDAGES/DRESSINGS) IMPLANT
BINDER BREAST XXLRG (GAUZE/BANDAGES/DRESSINGS) IMPLANT
BIOPATCH RED 1 DISK 7.0 (GAUZE/BANDAGES/DRESSINGS) IMPLANT
BLADE HEX COATED 2.75 (ELECTRODE) ×3 IMPLANT
BLADE SURG 10 STRL SS (BLADE) ×3 IMPLANT
BLADE SURG 15 STRL LF DISP TIS (BLADE) ×2 IMPLANT
BLADE SURG 15 STRL SS (BLADE) ×6
BNDG GAUZE ELAST 4 BULKY (GAUZE/BANDAGES/DRESSINGS) ×2 IMPLANT
CANISTER SUCT 1200ML W/VALVE (MISCELLANEOUS) ×3 IMPLANT
CHLORAPREP W/TINT 26 (MISCELLANEOUS) ×4 IMPLANT
COVER BACK TABLE REUSABLE LG (DRAPES) ×3 IMPLANT
COVER MAYO STAND REUSABLE (DRAPES) ×3 IMPLANT
COVER WAND RF STERILE (DRAPES) IMPLANT
DECANTER SPIKE VIAL GLASS SM (MISCELLANEOUS) IMPLANT
DERMABOND ADVANCED (GAUZE/BANDAGES/DRESSINGS) ×2
DERMABOND ADVANCED .7 DNX12 (GAUZE/BANDAGES/DRESSINGS) IMPLANT
DRAIN CHANNEL 19F RND (DRAIN) IMPLANT
DRAPE LAPAROSCOPIC ABDOMINAL (DRAPES) ×3 IMPLANT
DRSG PAD ABDOMINAL 8X10 ST (GAUZE/BANDAGES/DRESSINGS) ×6 IMPLANT
DRSG TEGADERM 2-3/8X2-3/4 SM (GAUZE/BANDAGES/DRESSINGS) IMPLANT
ELECT BLADE 4.0 EZ CLEAN MEGAD (MISCELLANEOUS) ×3
ELECT COATED BLADE 2.86 ST (ELECTRODE) ×3 IMPLANT
ELECT REM PT RETURN 9FT ADLT (ELECTROSURGICAL) ×3
ELECTRODE BLDE 4.0 EZ CLN MEGD (MISCELLANEOUS) ×2 IMPLANT
ELECTRODE REM PT RTRN 9FT ADLT (ELECTROSURGICAL) ×2 IMPLANT
EVACUATOR SILICONE 100CC (DRAIN) IMPLANT
GAUZE SPONGE 4X4 12PLY STRL (GAUZE/BANDAGES/DRESSINGS) IMPLANT
GAUZE SPONGE 4X4 12PLY STRL LF (GAUZE/BANDAGES/DRESSINGS) IMPLANT
GLOVE BIO SURGEON STRL SZ 6.5 (GLOVE) ×8 IMPLANT
GLOVE BIOGEL PI IND STRL 7.0 (GLOVE) IMPLANT
GLOVE BIOGEL PI INDICATOR 7.0 (GLOVE) ×1
GOWN STRL REUS W/ TWL LRG LVL3 (GOWN DISPOSABLE) ×4 IMPLANT
GOWN STRL REUS W/TWL LRG LVL3 (GOWN DISPOSABLE) ×9
IMPL EXPANDER BREAST 535CC (Breast) ×1 IMPLANT
IMPL EXPANDER BREAST 650CC (Breast) IMPLANT
IMPLANT BREAST 535CC (Breast) ×1 IMPLANT
IMPLANT EXPANDER 650CC (Breast) ×1 IMPLANT
IMPLANT EXPANDER BREAST 535CC (Breast) ×2 IMPLANT
IMPLANT EXPANDER BREAST 650CC (Breast) ×2 IMPLANT
IV NS 1000ML (IV SOLUTION) ×3
IV NS 1000ML BAXH (IV SOLUTION) IMPLANT
KIT FILL SYSTEM UNIVERSAL (SET/KITS/TRAYS/PACK) ×1 IMPLANT
NDL HYPO 25X1 1.5 SAFETY (NEEDLE) ×1 IMPLANT
NDL SAFETY ECLIPSE 18X1.5 (NEEDLE) ×2 IMPLANT
NEEDLE HYPO 18GX1.5 SHARP (NEEDLE) ×3
NEEDLE HYPO 25X1 1.5 SAFETY (NEEDLE) ×3 IMPLANT
NS IRRIG 1000ML POUR BTL (IV SOLUTION) ×3 IMPLANT
PACK BASIN DAY SURGERY FS (CUSTOM PROCEDURE TRAY) ×3 IMPLANT
PENCIL BUTTON HOLSTER BLD 10FT (ELECTRODE) ×3 IMPLANT
PIN SAFETY STERILE (MISCELLANEOUS) IMPLANT
SIZER BREAST REUSE 535CC (SIZER) ×3
SIZER BRST REUSE ULT HI 535CC (SIZER) IMPLANT
SLEEVE SCD COMPRESS KNEE MED (MISCELLANEOUS) ×3 IMPLANT
SPONGE LAP 18X18 RF (DISPOSABLE) ×6 IMPLANT
STRIP CLOSURE SKIN 1/2X4 (GAUZE/BANDAGES/DRESSINGS) ×1 IMPLANT
STRIP SUTURE WOUND CLOSURE 1/2 (SUTURE) ×1 IMPLANT
SUT MNCRL AB 3-0 PS2 18 (SUTURE) IMPLANT
SUT MNCRL AB 4-0 PS2 18 (SUTURE) ×7 IMPLANT
SUT MON AB 3-0 SH 27 (SUTURE) ×6
SUT MON AB 3-0 SH27 (SUTURE) ×2 IMPLANT
SUT MON AB 5-0 PS2 18 (SUTURE) ×4 IMPLANT
SUT PDS 3-0 CT2 (SUTURE)
SUT PDS AB 2-0 CT2 27 (SUTURE) ×4 IMPLANT
SUT PDS II 3-0 CT2 27 ABS (SUTURE) IMPLANT
SUT SILK 3 0 PS 1 (SUTURE) IMPLANT
SUT VIC AB 3-0 SH 27 (SUTURE)
SUT VIC AB 3-0 SH 27X BRD (SUTURE) ×2 IMPLANT
SUT VICRYL 4-0 PS2 18IN ABS (SUTURE) ×2 IMPLANT
SYR BULB IRRIGATION 50ML (SYRINGE) ×3 IMPLANT
SYR CONTROL 10ML LL (SYRINGE) ×3 IMPLANT
TAPE MEASURE VINYL STERILE (MISCELLANEOUS) ×3 IMPLANT
TOWEL GREEN STERILE FF (TOWEL DISPOSABLE) ×6 IMPLANT
TUBE CONNECTING 20X1/4 (TUBING) ×3 IMPLANT
UNDERPAD 30X36 HEAVY ABSORB (UNDERPADS AND DIAPERS) ×6 IMPLANT
YANKAUER SUCT BULB TIP NO VENT (SUCTIONS) ×3 IMPLANT

## 2019-03-03 NOTE — Discharge Instructions (Signed)
INSTRUCTIONS FOR AFTER SURGERY   You are having surgery.  You will likely have some questions about what to expect following your operation.  The following information will help you and your family understand what to expect when you are discharged from the hospital.  Following these guidelines will help ensure a smooth recovery and reduce risks of complications.   Postoperative instructions include information on: diet, wound care, medications and physical activity.  AFTER SURGERY Expect to go home after the procedure.  In some cases, you may need to spend one night in the hospital for observation.  DIET This surgery does not require a specific diet.  However, I have to mention that the healthier you eat the better your body can start healing. It is important to increasing your protein intake.  This means limiting the foods with sugar and carbohydrates.  Focus on vegetables and some meat.  If you have any liposuction during your procedure be sure to drink water.  If your urine is bright yellow, then it is concentrated, and you need to drink more water.  As a general rule after surgery, you should have 8 ounces of water every hour while awake.  If you find you are persistently nauseated or unable to take in liquids let us know.  NO TOBACCO USE or EXPOSURE.  This will slow your healing process and increase the risk of a wound.  WOUND CARE If you don't have a drain: You can shower the day after surgery if you don't have a drain.  Use fragrance free soap.  Dial, [place] and [place] are usually mild on the skin.   If you have steri-strips / tape directly attached to your skin leave them in place. It is OK to get these wet.  No baths, pools or hot tubs for two weeks. We close your incision to leave the smallest and best-looking scar. No ointment or creams on your incisions until given the go ahead.  Especially not Neosporin (Too many skin reactions with this one).  A few weeks after surgery you can use Mederma  and start massaging the scar. We ask you to wear your binder or sports bra for the first 6 weeks around the clock, including while sleeping. This provides added comfort and helps reduce the fluid accumulation at the surgery site.  ACTIVITY No heavy lifting until cleared by the doctor.  It is OK to walk and climb stairs. In fact, moving your legs is very important to decrease your risk of a blood clot.  It will also help keep you from getting deconditioned.  Every 1 to 2 hours get up and walk for 5 minutes. This will help with a quicker recovery back to normal.  Let pain be your guide so you don't do too much.  NO, you cannot do the spring cleaning and don't plan on taking care of anyone else.  This is your time for TLC.  You will be more comfortable if you sleep and rest with your head elevated either with a few pillows under you or in a recliner.  No stomach sleeping for a few months.  WORK Everyone returns to work at different times. As a rough guide, most people take at least 1 - 2 weeks off prior to returning to work. If you need documentation for your job, bring the forms to your postoperative follow up visit.  DRIVING Arrange for someone to bring you home from the hospital.  You may be able to drive a few  days after surgery but not while taking any narcotics or valium.  BOWEL MOVEMENTS Constipation can occur after anesthesia and while taking pain medication.  It is important to stay ahead for your comfort.  We recommend taking Milk of Magnesia (2 tablespoons; twice a day) while taking the pain pills.  SEROMA This is fluid your body tried to put in the surgical site.  This is normal but if it creates tight skinny skin let us know.  It usually decreases in a few weeks.  WHEN TO CALL Call your surgeon's office if any of the following occur:  Fever 101 degrees F or greater  Excessive bleeding or fluid from the incision site.  Pain that increases over time without aid from the  medications  Redness, warmth, or pus draining from incision sites  Persistent nausea or inability to take in liquids  Severe misshapen area that underwent the operation.   Post Anesthesia Home Care Instructions  Activity: Get plenty of rest for the remainder of the day. A responsible individual must stay with you for 24 hours following the procedure.  For the next 24 hours, DO NOT: -Drive a car -Paediatric nurse -Drink alcoholic beverages -Take any medication unless instructed by your physician -Make any legal decisions or sign important papers.  Meals: Start with liquid foods such as gelatin or soup. Progress to regular foods as tolerated. Avoid greasy, spicy, heavy foods. If nausea and/or vomiting occur, drink only clear liquids until the nausea and/or vomiting subsides. Call your physician if vomiting continues.  Special Instructions/Symptoms: Your throat may feel dry or sore from the anesthesia or the breathing tube placed in your throat during surgery. If this causes discomfort, gargle with warm salt water. The discomfort should disappear within 24 hours.  If you had a scopolamine patch placed behind your ear for the management of post- operative nausea and/or vomiting:  1. The medication in the patch is effective for 72 hours, after which it should be removed.  Wrap patch in a tissue and discard in the trash. Wash hands thoroughly with soap and water. 2. You may remove the patch earlier than 72 hours if you experience unpleasant side effects which may include dry mouth, dizziness or visual disturbances. 3. Avoid touching the patch. Wash your hands with soap and water after contact with the patch.

## 2019-03-03 NOTE — Transfer of Care (Signed)
Immediate Anesthesia Transfer of Care Note  Patient: Buffie C Barthel  Procedure(s) Performed: REMOVAL OF TISSUE EXPANDER AND PLACEMENT OF EXPANDER (Left Breast) RIGHT BREAST MASTOPEXY/REDUCTION (Right Breast)  Patient Location: PACU  Anesthesia Type:General  Level of Consciousness: awake, alert  and oriented  Airway & Oxygen Therapy: Patient Spontanous Breathing and Patient connected to face mask oxygen  Post-op Assessment: Report given to RN and Post -op Vital signs reviewed and stable  Post vital signs: Reviewed and stable  Last Vitals:  Vitals Value Taken Time  BP    Temp    Pulse 92 03/03/19 1452  Resp    SpO2 100 % 03/03/19 1452  Vitals shown include unvalidated device data.  Last Pain:  Vitals:   03/03/19 1143  TempSrc: Oral  PainSc: 0-No pain      Patients Stated Pain Goal: 0 ([id number])  Complications: No apparent anesthesia complications

## 2019-03-03 NOTE — Op Note (Signed)
Op report Bilateral Exchange   DATE OF OPERATION: 03/03/2019  LOCATION: [place]  SURGICAL DIVISION: Plastic Surgery  PREOPERATIVE DIAGNOSES:  1.History of breast cancer.  2. Acquired absence of bilateral breast.  3. 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  POSTOPERATIVE DIAGNOSES:  1. History of breast cancer.  2. Acquired absence of bilateral breast.  3. 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  PROCEDURE:  1. Left Exchange of tissue expanders for new expander due to leak.  2. Left capsulotomies for implant respositioning. 3. Right breast reduction / mastopexy for symmetry  SURGEON: Claire Sanger Dillingham, DO  ASSISTANT: Roetta Sessions, PA and [place], RNFA  ANESTHESIA:  General.   COMPLICATIONS: None.   IMPLANTS: Left - [employer]or Artoura Ultra High Profile Smooth 650cc. Ref [id number].  Serial Number [id number], 400 cc of saline placed in expander.  INDICATIONS FOR PROCEDURE:  The patient, Mackenzie [name], is a 48 y.o. female born on 30-Dec-1970, is here for treatment after a left mastectomy.  She had tissue expanders placed at the time of mastectomies. She now presents for exchange of her expanders for implants.  She requires capsulotomies to better position the implants. She also had asymmetry on the right due to the reconstruction. MRN: [id number]  CONSENT:  Informed consent was obtained directly from the patient. Risks, benefits and alternatives were fully discussed. Specific risks including but not limited to bleeding, infection, hematoma, seroma, scarring, pain, implant infection, implant extrusion, capsular contracture, asymmetry, wound healing problems, and need for further surgery were all discussed. The patient did have an ample opportunity to have her questions answered to her satisfaction.   DESCRIPTION OF PROCEDURE:  The patient was taken to the operating room. SCDs were placed and IV antibiotics were given. The patient's chest was  prepped and draped in a sterile fashion. A time out was performed and the implants to be used were identified.  Local was placed in the incision sites.   Left breast: The old latissimus flap scar was incised at the inferior portion of the elipse.  The bovie was used to dissect to the expander through the latissimus muscle. The expander had ~ 350 cc of saline in the device.  There was evidently a leak.  It was removed.  The capsule was release circumferentially and scored to release the tension.    Measurements were made and a sizer utilized to confirm adequate pocket size for the implant dimensions.  Hemostasis was ensured with the electrocautery.  New gloves were applied. There was not enough expansion to place a 535 implant and close the tissue.  The decision was made to place an expander.  This had been discussed with the patient in detail prior to the procedure.  The expander was soaked in antibiotic solution and placed in the pocket and oriented appropriately. It was secured to the chest wall with 2-0 PDS.  The latissimus muscle and capsule on the anterior surface were re-closed with a 3-0 Monocryl suture. The remaining skin was closed with 4-0 Monocryl deep dermal and 5-0 Monocryl subcuticular stitches.  Dermabond was applied to the incision site. The expander was infused with 400 cc of saline.   Right side: Preoperative markings were confirmed.  Incision lines were injected with 1% Xylocaine with epinephrine.  After waiting for vasoconstriction, the marked lines were incised.  A Wise-pattern breast mastopexy was performed by de-epithelializing the periareola area.  The inferior portion of the breast was excised.  Care was taken to not undermine the breast pedicle. Hemostasis was achieved.  The nipple was gently lifted into position and the soft tissue closed with 4-0 Monocryl.   The deep tissues were approximated with 3-0 monocryl sutures and the skin was closed with deep dermal and subcuticular 4-0  Monocryl sutures.  The nipple and skin flaps had good capillary refill at the end of the procedure.   Dermabond was applied.  A breast binder and ABDs were placed.  The nipple and skin flaps had good capillary refill at the end of the procedure.  The patient tolerated the procedure well. The patient was allowed to wake from anesthesia and taken to the recovery room in satisfactory condition  The advanced practice practitioner (APP) assisted throughout the case.  The APP was essential in retraction and counter traction when needed to make the case progress smoothly.  This retraction and assistance made it possible to see the tissue plans for the procedure.  The assistance was needed for blood control, tissue re-approximation and assisted with closure of the incision site.

## 2019-03-03 NOTE — Anesthesia Postprocedure Evaluation (Signed)
Anesthesia Post Note  Patient: Harlowe C Hussein  Procedure(s) Performed: REMOVAL OF TISSUE EXPANDER AND PLACEMENT OF EXPANDER (Left Breast) RIGHT BREAST MASTOPEXY/REDUCTION (Right Breast)     Patient location during evaluation: PACU Anesthesia Type: General Level of consciousness: awake and alert and oriented Pain management: pain level controlled Vital Signs Assessment: post-procedure vital signs reviewed and stable Respiratory status: spontaneous breathing, nonlabored ventilation and respiratory function stable Cardiovascular status: blood pressure returned to baseline and stable Postop Assessment: no apparent nausea or vomiting Anesthetic complications: no    Last Vitals:  Vitals:   03/03/19 1500 03/03/19 1515  BP: 130/79 140/76  Pulse: 87 85  Resp: 19 20  Temp:    SpO2: 100% 100%    Last Pain:  Vitals:   03/03/19 1500  TempSrc:   PainSc: 0-No pain                 Rhyse Loux A.

## 2019-03-03 NOTE — Anesthesia Procedure Notes (Signed)
Procedure Name: Intubation Date/Time: 03/03/2019 12:33 PM Performed by: Willa Frater, CRNA Pre-anesthesia Checklist: Patient identified, Emergency Drugs available, Suction available and Patient being monitored Patient Re-evaluated:Patient Re-evaluated prior to induction Oxygen Delivery Method: Circle system utilized Preoxygenation: Pre-oxygenation with 100% oxygen Induction Type: IV induction Ventilation: Mask ventilation without difficulty Laryngoscope Size: Mac and 3 Grade View: Grade I Tube type: Oral Tube size: 7.0 mm Number of attempts: 1 Airway Equipment and Method: Stylet and Oral airway Placement Confirmation: ETT inserted through vocal cords under direct vision,  positive ETCO2 and breath sounds checked- equal and bilateral Secured at: 22 cm Tube secured with: Tape Dental Injury: Teeth and Oropharynx as per pre-operative assessment

## 2019-03-03 NOTE — Interval H&P Note (Signed)
History and Physical Interval Note:  03/03/2019 11:58 AM  Mackenzie [name]  has presented today for surgery, with the diagnosis of Acquired Absence Of Left Breast, Malignant neoplasm of upper-inner quadrant of left breast in female, estrogen receptor positive.  The various methods of treatment have been discussed with the patient and family. After consideration of risks, benefits and other options for treatment, the patient has consented to  Procedure(s) with comments: REMOVAL OF TISSUE EXPANDER AND PLACEMENT OF IMPLANT (Left) MASTOPEXY (Right) - 3 hours, please as a surgical intervention.  The patient's history has been reviewed, patient examined, no change in status, stable for surgery.  I have reviewed the patient's chart and labs.  Questions were answered to the patient's satisfaction.     Loel Lofty Dillingham

## 2019-03-03 NOTE — Anesthesia Preprocedure Evaluation (Signed)
Anesthesia Evaluation  Patient identified by MRN, date of birth, ID band Patient awake    Reviewed: Allergy & Precautions, NPO status , Patient's Chart, lab work & pertinent test results  Airway Mallampati: III  TM Distance: >3 FB Neck ROM: Full    Dental no notable dental hx. (+) Teeth Intact   Pulmonary neg pulmonary ROS,    Pulmonary exam normal breath sounds clear to auscultation       Cardiovascular hypertension, Pt. on medications Normal cardiovascular exam Rhythm:Regular Rate:Normal     Neuro/Psych negative neurological ROS  negative psych ROS   GI/Hepatic negative GI ROS, Neg liver ROS,   Endo/Other  diabetes, Well ControlledMorbid obesityAcquired absence of left breast Left Breast Ca  Renal/GU negative Renal ROS     Musculoskeletal negative musculoskeletal ROS (+)   Abdominal (+) + obese,   Peds  Hematology  (+) anemia ,   Anesthesia Other Findings   Reproductive/Obstetrics                             Anesthesia Physical Anesthesia Plan  ASA: III  Anesthesia Plan: General   Post-op Pain Management:    Induction: Intravenous  PONV Risk Score and Plan: 4 or greater and Ondansetron and Treatment may vary due to age or medical condition  Airway Management Planned: Oral ETT  Additional Equipment:   Intra-op Plan:   Post-operative Plan: Extubation in OR  Informed Consent: I have reviewed the patients History and Physical, chart, labs and discussed the procedure including the risks, benefits and alternatives for the proposed anesthesia with the patient or authorized representative who has indicated his/her understanding and acceptance.       Plan Discussed with: CRNA and Surgeon  Anesthesia Plan Comments:         Anesthesia Quick Evaluation

## 2019-03-04 ENCOUNTER — Encounter (HOSPITAL_BASED_OUTPATIENT_CLINIC_OR_DEPARTMENT_OTHER): Payer: Self-pay | Admitting: Plastic Surgery

## 2019-03-07 ENCOUNTER — Other Ambulatory Visit: Payer: Self-pay | Admitting: Oncology

## 2019-03-07 ENCOUNTER — Other Ambulatory Visit: Payer: Self-pay

## 2019-03-07 ENCOUNTER — Inpatient Hospital Stay: Payer: 59 | Attending: Adult Health

## 2019-03-07 VITALS — BP 135/67 | Temp 98.3°F | Resp 16

## 2019-03-07 DIAGNOSIS — Z170 Estrogen receptor positive status [ER+]: Secondary | ICD-10-CM | POA: Insufficient documentation

## 2019-03-07 DIAGNOSIS — C50812 Malignant neoplasm of overlapping sites of left female breast: Secondary | ICD-10-CM | POA: Insufficient documentation

## 2019-03-07 DIAGNOSIS — Z5111 Encounter for antineoplastic chemotherapy: Secondary | ICD-10-CM | POA: Diagnosis not present

## 2019-03-07 DIAGNOSIS — C773 Secondary and unspecified malignant neoplasm of axilla and upper limb lymph nodes: Secondary | ICD-10-CM | POA: Insufficient documentation

## 2019-03-07 DIAGNOSIS — Z006 Encounter for examination for normal comparison and control in clinical research program: Secondary | ICD-10-CM | POA: Insufficient documentation

## 2019-03-07 LAB — SURGICAL PATHOLOGY

## 2019-03-07 MED ORDER — GOSERELIN ACETATE 3.6 MG ~~LOC~~ IMPL
3.6000 mg | DRUG_IMPLANT | Freq: Once | SUBCUTANEOUS | Status: AC
  Administered 2019-03-07: 3.6 mg via SUBCUTANEOUS

## 2019-03-07 MED ORDER — GOSERELIN ACETATE 3.6 MG ~~LOC~~ IMPL
DRUG_IMPLANT | SUBCUTANEOUS | Status: AC
  Filled 2019-03-07: qty 3.6

## 2019-03-10 NOTE — Progress Notes (Signed)
   Subjective:     Patient ID: Mackenzie [name], female    DOB: 07-Oct-1970, 48 y.o.   MRN: [id number]  Chief Complaint  Patient presents with  . Post-op Follow-up    for removal of (L) breast expander and placement of implant    HPI: The patient is a 48 y.o. female here for follow-up after removal of left tissue expander for new expander due to leak, left capsulotomy, right breast reduction/mastopexy for symmetry.  At the time of surgery 400 cc of saline was infused into the left breast expander.  Mackenzie [name] is doing really well today.  The right breast is doing well, incisions are healing well.  Steri-Strips are in place along the vertical limb.  NAC has good color.  She does not have any drainage.  No sign of infection, seroma, hematoma.  She is happy with the size of her right breast.  Left breast with the 400 cc of fluid in the expander is doing well.  Incision is C/D/I.  No surrounding erythema, drainage, dehiscence.  Denies any fever, chills, nausea, vomiting.  No complaints.  Review of Systems  Constitutional: Negative for chills, fever, malaise/fatigue and weight loss.       + Activity change  Cardiovascular: Negative.   Genitourinary: Negative.   Musculoskeletal: Negative.   Skin: Negative for itching and rash.  Neurological: Positive for sensory change. Negative for dizziness, focal weakness, weakness and headaches.     Objective:   Vital Signs BP (!) 150/84 (BP Location: Right Arm, Patient Position: Sitting, Cuff Size: Large)   Pulse 81   Temp (!) 97.5 F (36.4 C) (Temporal)   Ht 5\' 6"  (1.676 m)   Wt 274 lb 3.2 oz (124.4 kg)   SpO2 97%   BMI 44.26 kg/m  Vital Signs and Nursing Note Reviewed Chaperone present Physical Exam  Constitutional: She is oriented to person, place, and time and well-developed, well-nourished, and in no distress.  HENT:  Head: Normocephalic and atraumatic.  Cardiovascular: Normal rate.  Pulmonary/Chest: Effort normal.     Musculoskeletal: Normal range of motion.  Neurological: She is alert and oriented to person, place, and time. Gait normal.  Skin: Skin is warm and dry. No rash noted. She is not diaphoretic. No erythema. No pallor.  Psychiatric: Mood and affect normal.      Assessment/Plan:     ICD-10-CM   1. Malignant 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, estrogen receptor positive ([place])  C50.812    Z17.0     Mackenzie [name] is doing well today.  She reports that her pain is been minimal and well controlled.  She reports some steri strips had fallen off the right breast incision. No fever, chills, n/v.   She is happy with the right breast size.  We placed injectable saline in the Expander using a sterile technique: Left: 50 cc for a total of 450 / 650 cc  Follow up in 2 weeks.   Mackenzie Rhine Americus Perkey, PA-C 03/11/2019, 11:52 AM

## 2019-03-11 ENCOUNTER — Telehealth: Payer: Self-pay | Admitting: Adult Health

## 2019-03-11 ENCOUNTER — Encounter: Payer: Self-pay | Admitting: Surgical

## 2019-03-11 ENCOUNTER — Ambulatory Visit (INDEPENDENT_AMBULATORY_CARE_PROVIDER_SITE_OTHER): Payer: 59 | Admitting: Surgical

## 2019-03-11 ENCOUNTER — Other Ambulatory Visit: Payer: Self-pay

## 2019-03-11 VITALS — BP 150/84 | HR 81 | Temp 97.5°F | Ht 66.0 in | Wt 274.2 lb

## 2019-03-11 DIAGNOSIS — Z170 Estrogen receptor positive status [ER+]: Secondary | ICD-10-CM

## 2019-03-11 DIAGNOSIS — C50812 Malignant neoplasm of overlapping sites of left female breast: Secondary | ICD-10-CM

## 2019-03-11 NOTE — Telephone Encounter (Signed)
I left a message regarding reschedule for 10/26 to 10/27

## 2019-03-14 ENCOUNTER — Other Ambulatory Visit (HOSPITAL_COMMUNITY): Payer: 59

## 2019-03-14 ENCOUNTER — Telehealth: Payer: Self-pay | Admitting: *Deleted

## 2019-03-14 DIAGNOSIS — C50812 Malignant neoplasm of overlapping sites of left female breast: Secondary | ICD-10-CM

## 2019-03-14 NOTE — Progress Notes (Signed)
[facility]  [phone] [phone]    ID: Mackenzie [name] DOB: [birth year]  MR#: [id number]  KGU#:[id number]  Patient Care Team: Willey Blade, MD as PCP - General (Internal Medicine) Magrinat, [name], MD as Consulting Physician (Oncology) Erroll Luna, MD as Consulting Physician (General Surgery) Kyung Rudd, MD as Consulting Physician (Radiation Oncology) Dillingham, Loel Lofty, DO as Attending Physician (Plastic Surgery) Cathlean Cower, RN as Registered Nurse OTHER MD:    CHIEF COMPLAINT: Estrogen receptor positive breast cancer  CURRENT TREATMENT: letrozole, goserelin; on [name] trial (ribociclib arm)   INTERVAL HISTORY: Mackenzie [name] returns today for follow-up and treatment of her estrogen receptor positive breast cancer.  Mackenzie [name] is on study with [name]--Ribociclb and anti estrogen therapy with Letrozole daily.  She is here for post op assessment after holding the Ribociclib pre op since 9/15.  She underwent breast reconstruction on 9/24, she underwent right breast reduction and left breast expander placement.  Today is cycle 11 day 9 of study.    She continues on letrozole.  She tolerates this well, with no significant hot flashes or vaginal dryness problems.  She also continues on goserelin every 4 weeks. The most recent dose was received on 03/07/2019.   Mackenzie [name]'s last bone density screening on 03/16/2018, showed a T-score of -0.7, which is considered normal.      REVIEW OF SYSTEMS: Mackenzie [name] is tolerated her surgery well.  She had one episode of nausea, that she took one zofran for and that has since resolved.  She says her pain at her breast is improving.  She takes Hydrocodone at night to help with pain and so she can sleep.  She does have occasional muscle spasms.  She will take Valium for this sensation.  She takes the valium 1-2 times per day.    Mackenzie [name] has no fever or chills.  She has chronic allergies that wax and  wane, so she does have a  Cough and clear nasal drainage that are brief.  She participated in the Women's only this past weekend and completed the 5k in 1 hour.  She is walking regularly.  She denies any chest pain, palpitations, nausea, vomiting, bowel/bladder changes.  She does eat prunes to avoid constipation from her pain medications.  A detailed ROS was otherwise non contributory.    HISTORY OF CURRENT ILLNESS: From the original intake note:  The patient herself noted some changes in her left breast late July 2018, she says, and eventually brought this to medical attention so that on 01/09/2017 she underwent bilateral diagnostic mammography with tomography and left breast ultrasonography at the breast Center. This found the breast density to be category C. In the upper inner quadrant of the left breast there was an area of asymmetry and there were malignant type calcifications involving all 4 quadrants. On exam there is firmness and palpable thickening in the anterior left breast with skin dimpling. Ultrasonography found at the 9:30 o'clock radiant 3 cm from the nipple a 2.7 cm mass and in the left axilla for abnormal lymph nodes largest of which measured 2.7 cm.  Biopsy of the left breast 9:30 o'clock mass 80 01/26/2017 showed ([id number]) invasive ductal carcinoma, with extracellular mucin, grade 1 or 2. In the lower outer left breast is separated biopsy the same day showed ductal carcinoma in situ. One of the 4 lymph nodes involved was positive for metastatic carcinoma. Prognostic panel on the invasive disease showed it to be estrogen receptor 100% positive, progesterone receptor 20%  positive, both with strong staining intensity, with an MIB-1 of 20%, and no HER-2 amplification with a signals ratio 1.38 and the number per cell 2.90.  On 01/22/2017 the patient underwent bilateral breast MRI. This showed no involvement of the right breast, but in the left breast there was a masslike and non-masslike  enhancement involving all quadrants, with skin swelling but no abnormal enhancement of the skin or nipple areolar complex or pectoralis muscle. The mass could not be clearly measured but spanned approximately 12 cm. There was bulky left axillary lymphadenopathy, with the largest lymph node measuring up to 3.4 cm.  The patient's subsequent history is as detailed below.   PAST MEDICAL HISTORY: Past Medical History:  Diagnosis Date  . Abnormal glucose 2018  . 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 Allergic rhinitis 2012  . Anemia 01/27/2017   prior to starting chemotherapy  . Breast cancer ([place]) 01/14/2017   Left breast  . 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 Hot flashes 03/2017  . Hyperlipidemia 03/20/2016  . Morbid obesity with body mass index (BMI) of 40.0 to 49.9 ([facility]) 09/17/2017  . NCGS (non-celiac gluten sensitivity)   . Pre-diabetes   . Seasonal allergies 2012   seasonal allergies causes allergic rhinitis and itchy, dry eyes per pt  . Vitamin D deficiency 05/2015    PAST SURGICAL HISTORY: Past Surgical History:  Procedure Laterality Date  . BREAST RECONSTRUCTION WITH PLACEMENT OF TISSUE EXPANDER AND FLEX HD (ACELLULAR HYDRATED DERMIS) Left 08/27/2017   Procedure: LEFT BREAST RECONSTRUCTION WITH PLACEMENT OF TISSUE EXPANDER AND FLEX HD;  Surgeon: Wallace Going, DO;  Location: [facility];  Service: Plastics;  Laterality: Left;  . CESAREAN SECTION     x2  . LATISSIMUS FLAP TO BREAST Left 11/03/2018  . LATISSIMUS FLAP TO BREAST Left 11/03/2018   Procedure: LATISSIMUS FLAP TO LEFT BREAST;  Surgeon: Wallace Going, DO;  Location: [facility];  Service: Plastics;  Laterality: Left;  Marland Kitchen MASTECTOMY MODIFIED RADICAL Left 08/27/2017  . MASTECTOMY MODIFIED RADICAL Left 08/27/2017   Procedure: LEFT MODIFIED RADICAL MASTECTOMY;  Surgeon: Erroll Luna, MD;  Location: [facility];  Service: General;  Laterality: Left;  Marland Kitchen MASTOPEXY Right 03/03/2019    Procedure: RIGHT BREAST MASTOPEXY/REDUCTION;  Surgeon: Wallace Going, DO;  Location: [facility];  Service: Plastics;  Laterality: Right;  3 hours, please  . PORT-A-CATH REMOVAL Right 08/27/2017   Procedure: REMOVAL PORT-A-CATH RIGHT CHEST;  Surgeon: Erroll Luna, MD;  Location: [facility];  Service: General;  Laterality: Right;  . PORTACATH PLACEMENT Right 01/28/2017   Procedure: INSERTION PORT-A-CATH WITH ULTRASOUND;  Surgeon: Erroll Luna, MD;  Location: [facility];  Service: General;  Laterality: Right;  . REMOVAL OF TISSUE EXPANDER AND PLACEMENT OF IMPLANT Left 03/03/2019   Procedure: REMOVAL OF TISSUE EXPANDER AND PLACEMENT OF EXPANDER;  Surgeon: Wallace Going, DO;  Location: [facility];  Service: Plastics;  Laterality: Left;  . TISSUE EXPANDER PLACEMENT Left 11/03/2018   Procedure: PLACEMENT OF TISSUE EXPANDER LEFT BREAST;  Surgeon: Wallace Going, DO;  Location: [facility];  Service: Plastics;  Laterality: Left;  Total case time is 3.5 hours  . TUBAL LIGATION Bilateral 01/21/2011    FAMILY HISTORY Family History  Problem Relation Age of Onset  . Breast cancer Paternal Grandmother 25       d.60s from breast cancer. Did not have treatment.  . Other Mother  M.41 from complications of surgery to remove brain tumor   The patient's father still alive at age 55. The patient's mother died with a brain tumor which the patient says was "benign". She was 56. The patient has one brother, no sisters. The only breast cancer in the family is a paternal grandmother who died from breast cancer at an unknown age. There is no history of ovarian or prostate cancer in the family.   GYNECOLOGIC HISTORY:  No LMP recorded. (Menstrual status: Chemotherapy). Menarche age 46 and first live birth age 85 she is [place] P4. The patient is still having regular periods as of August 2018   SOCIAL HISTORY: (Updated 05/31/2018) Jaelin works at [employer]  on the fifth floor, [place]. She is separated from her husband (as of 05/2018), [name], who is a Building control surveyor. Daughter is married and lives in [place] and works as a Education administrator. Zori's daughter, [name], lives in [place] and works in the police department. Daughters, Lebanon and Roman Forest are 28 and 6, living at home. The patient has a grandson she looks after. She attends a [employer].     ADVANCED DIRECTIVES: Not in place   HEALTH MAINTENANCE: Social History   Tobacco Use  . Smoking status: Never Smoker  . Smokeless tobacco: Never Used  Substance Use Topics  . Alcohol use: Yes    Comment: social  . Drug use: No    Colonoscopy: Never  PAP: December 2017  Bone density: Never   Allergies  Allergen Reactions  . Dilaudid [Hydromorphone] Itching    Current Outpatient Medications  Medication Sig Dispense Refill  . acetaminophen (TYLENOL) 500 MG tablet Take 1,000 mg by mouth every 6 (six) hours as needed (for pain/headaches.).     Marland Kitchen B Complex Vitamins (VITAMIN B COMPLEX PO) Take 1 capsule by mouth 2 (two) times daily.     . budesonide-formoterol (SYMBICORT) 80-4.5 MCG/ACT inhaler Inhale 2 puffs into the lungs every morning.     . Calcium Carb-Cholecalciferol (CALCIUM 600+D3 PO) Take 1 tablet by mouth 2 (two) times daily.     . cetirizine (ZYRTEC) 10 MG tablet Take 10 mg by mouth daily.     . Cholecalciferol (VITAMIN D-3) 5000 units TABS Take 5,000 Units by mouth 2 (two) times daily.     Marland Kitchen gabapentin (NEURONTIN) 300 MG capsule Take 1 capsule (300 mg total) by mouth at bedtime. (Patient taking differently: Take 300 mg by mouth every other day. ) 90 capsule 4  . HYDROcodone-acetaminophen (NORCO) 5-325 MG tablet Take 1 tablet by mouth every 6 (six) hours as needed for moderate pain. 30 tablet 0  . hydroxypropyl methylcellulose / hypromellose (ISOPTO TEARS / GONIOVISC) 2.5 % ophthalmic solution Place 1 drop into both eyes daily as needed for dry eyes.    Marland Kitchen ibuprofen  (ADVIL,MOTRIN) 200 MG tablet Take 800 mg by mouth every 8 (eight) hours as needed for mild pain (for pain.).     Marland Kitchen letrozole (FEMARA) 2.5 MG tablet Take 1 tablet (2.5 mg total) by mouth daily. 90 tablet 4  . loratadine (CLARITIN) 10 MG tablet Take 1 tablet (10 mg total) by mouth daily. (Patient taking differently: Take 10 mg by mouth daily as needed for allergies. ) 90 tablet 2  . Multiple Vitamin (MULTIVITAMIN WITH MINERALS) TABS tablet Take 1 tablet by mouth daily.     . Omega-3 Fatty Acids (FISH OIL) 1000 MG CAPS Take 1,000 mg by mouth 2 (two) times daily.     . phenylephrine (SUDAFED PE)  10 MG TABS tablet Take 10 mg by mouth every 4 (four) hours as needed.    . Probiotic Product (PROBIOTIC DAILY PO) Take 1 capsule by mouth 2 (two) times daily.     . Turmeric 450 MG CAPS Take 450 mg by mouth 2 (two) times daily.    Marland Kitchen ZINC OXIDE PO Take 1 tablet by mouth 2 (two) times a day.    . Investigational ribociclib (KISQALI) 200 MG tablet [name] Take 2 tablets (400 mg total) by mouth daily. Take 2 tablets (478m total) by mouth daily on days 1-21.  Repeat every 28 days. 225 tablet 0   No current facility-administered medications for this visit.    Facility-Administered Medications Ordered in Other Visits  Medication Dose Route Frequency Provider Last Rate Last Dose  . sodium chloride flush (NS) 0.9 % injection 10 mL  10 mL Intravenous PRN Magrinat, GVirgie Dad MD   10 mL at 03/10/17 1239    OBJECTIVE: Morbidly obese African-American woman who appears stated age  Vitals:   03/15/19 0920  BP: 125/79  Pulse: 86  Resp: 18  Temp: 98.4 F (36.9 C)  SpO2: 100%   Wt Readings from Last 3 Encounters:  03/15/19 269 lb 4.8 oz (122.2 kg)  03/11/19 274 lb 3.2 oz (124.4 kg)  03/03/19 268 lb 15.4 oz (122 kg)   Body mass index is 43.47 kg/m.    ECOG FS: 1 GENERAL: Patient is a well appearing female in no acute distress HEENT:  Sclerae anicteric.  Oropharynx clear and moist. No ulcerations or  evidence of oropharyngeal candidiasis. Neck is supple.  NODES:  No cervical, supraclavicular, or axillary lymphadenopathy palpated.  BREAST EXAM:  Right breast s/p reduction, healing well, left breast s/p radiation, expander in place, healing well, hyperpigmentation noted from radiation LUNGS:  Clear to auscultation bilaterally.  No wheezes or rhonchi. HEART:  Regular rate and rhythm. No murmur appreciated. ABDOMEN:  Soft, nontender.  Positive, normoactive bowel sounds. No organomegaly palpated. MSK:  No focal spinal tenderness to palpation. Full range of motion bilaterally in the upper extremities. EXTREMITIES:  No peripheral edema.   SKIN:  Clear with no obvious rashes or skin changes. No nail dyscrasia. NEURO:  Nonfocal. Well oriented.  Appropriate affect.      LAB RESULTS:  Appointment on 03/15/2019  Component Date Value Ref Range Status  . Sodium 03/15/2019 137  135 - 145 mmol/L Final  . Potassium 03/15/2019 3.7  3.5 - 5.1 mmol/L Final  . Chloride 03/15/2019 103  98 - 111 mmol/L Final  . CO2 03/15/2019 27  22 - 32 mmol/L Final  . Glucose, Bld 03/15/2019 112* 70 - 99 mg/dL Final  . BUN 03/15/2019 14  6 - 20 mg/dL Final  . Creatinine 03/15/2019 0.87  0.44 - 1.00 mg/dL Final  . Calcium 03/15/2019 9.2  8.9 - 10.3 mg/dL Final  . Total Protein 03/15/2019 7.5  6.5 - 8.1 g/dL Final  . Albumin 03/15/2019 3.8  3.5 - 5.0 g/dL Final  . AST 03/15/2019 63* 15 - 41 U/L Final  . ALT 03/15/2019 157* 0 - 44 U/L Final  . Alkaline Phosphatase 03/15/2019 128* 38 - 126 U/L Final  . Total Bilirubin 03/15/2019 0.3  0.3 - 1.2 mg/dL Final  . GFR, Est Non Af Am 03/15/2019 >60  >60 mL/min Final  . GFR, Est AFR Am 03/15/2019 >60  >60 mL/min Final  . Anion gap 03/15/2019 7  5 - 15 Final   Performed at [facility], [place] [address], [place], [place] [address]  . WBC Count 03/15/2019 7.9  4.0 - 10.5 K/uL Final  . RBC 03/15/2019 4.16  3.87 - 5.11 MIL/uL Final  . Hemoglobin  03/15/2019 11.7* 12.0 - 15.0 g/dL Final  . HCT 03/15/2019 36.3  36.0 - 46.0 % Final  . MCV 03/15/2019 87.3  80.0 - 100.0 fL Final  . MCH 03/15/2019 28.1  26.0 - 34.0 pg Final  . MCHC 03/15/2019 32.2  30.0 - 36.0 g/dL Final  . RDW 03/15/2019 14.6  11.5 - 15.5 % Final  . Platelet Count 03/15/2019 277  150 - 400 K/uL Final  . nRBC 03/15/2019 0.0  0.0 - 0.2 % Final  . Neutrophils Relative % 03/15/2019 71  % Final  . Neutro Abs 03/15/2019 5.6  1.7 - 7.7 K/uL Final  . Lymphocytes Relative 03/15/2019 16  % Final  . Lymphs Abs 03/15/2019 1.2  0.7 - 4.0 K/uL Final  . Monocytes Relative 03/15/2019 10  % Final  . Monocytes Absolute 03/15/2019 0.8  0.1 - 1.0 K/uL Final  . Eosinophils Relative 03/15/2019 3  % Final  . Eosinophils Absolute 03/15/2019 0.2  0.0 - 0.5 K/uL Final  . Basophils Relative 03/15/2019 0  % Final  . Basophils Absolute 03/15/2019 0.0  0.0 - 0.1 K/uL Final  . Immature Granulocytes 03/15/2019 0  % Final  . Abs Immature Granulocytes 03/15/2019 0.03  0.00 - 0.07 K/uL Final   Performed at [facility], [place] [address] [place], [place] [address]     STUDIES: No results found.   ELIGIBLE FOR AVAILABLE RESEARCH PROTOCOL: [name], ASA study   ASSESSMENT: 48 y.o. [place] woman status post left breast upper inner quadrant biopsy 01/14/2017 for a clinical T3 N2, stage IIA invasive ductal carcinoma, grade 1 or 2, estrogen and progesterone receptor positive, HER-2 nonamplified, with an MIB-1 of 20%.  (1) staging studies: Brain MRI, bone scan, and CT scan of the chest 02/05/2017 showed no brain lesions, no lung or liver lesions, a 4.9 cm mass in the left breast with left axillary and subpectoral adenopathy, and nonspecific bone scan tracer at L2, left scapula, and anterior ribs, with lumbar spine MRI suggested for further evaluation.  (a) lumbar spine MRI 02/17/2017 showed no normal bone lesions.  There was mild lumbar spondylosis  (2) neoadjuvant chemotherapy  consisting of cyclophosphamide and doxorubicin in dose dense fashion 4 started 02/10/2017, completed 03/24/2017, followed by weekly carboplatin and gemcitabine given days 1 and 8 of each 21-day cycle starting 04/14/2017, completing the planned 4 cycles 06/26/2017  (3) is post left modified radical mastectomy on 08/27/2017 showing an mpT3 pN2 residual invasive ductal carcinoma, grade 2, with a residual cancer burden of 3.  Margins were clear  (a) latissimus flap reconstruction with expander 11/03/2018  (4) postmastectomy radiation completed 01/01/2018  (a) capecitabine radiosensitization 11/16/2017-01/01/2018  (5) goserelin started 09/21/2017  (a) letrozole started 01/11/2018  (b) enrolled in [place] clinical trial, randomized to ribociclib on 05/31/2018  (c) Bone density on 03/16/2018 was normal with T score of -0.7 in the L1-L4 spine   (6) genetics testing 03/24/2017 through the Common Hereditary Cancer Panel offered by [facility] found no deleterious mutations in APC, ATM, AXIN2, BARD1, BMPR1A, BRCA1, BRCA2, BRIP1, CDH1, CDKN2A (p14ARF), CDKN2A (p16INK4a), CHEK2, CTNNA1, DICER1, EPCAM (Deletion/duplication testing only), GREM1 (promoter region deletion/duplication testing only), KIT, MEN1, MLH1, MSH2, MSH3, MSH6, MUTYH, NBN, NF1, NHTL1, PALB2, PDGFRA, PMS2, POLD1, POLE, PTEN, RAD50, RAD51C, RAD51D, SDHB, SDHC, SDHD,  SMAD4, SMARCA4. STK11, TP53, TSC1, TSC2, and VHL.  The following genes were evaluated for sequence changes only: SDHA and HOXB13 c.251G>A variant only.    (7) Right breast reduction and left breast expander placement on 03/03/2019   PLAN: Sheba is doing well today.  She tolerated her surgery well and her breast is healing well.  She is exercising.  Her CBC is normal, however CMET shows slight increase in her liver enzymes.  This is unrelated to the Ribociclib, however she cannot restart until it normalizes.  She will return in 2 weeks, for labs again to recheck.  I asked her research  nurse cameo to call and instruct the patient to avoid ETOH, tylenol.  She was recommended to continue with the appropriate pandemic precautions. She knows to call for any other issue that may develop before the next visit.  A total of (20) minutes of face-to-face time was spent with this patient with greater than 50% of that time in counseling and care-coordination.   [name], NP  03/15/19 3:51 PM Medical Oncology and Hematology [facility] [address] [place], [place] [address] Tel. [phone]    Fax. [phone]

## 2019-03-14 NOTE — Telephone Encounter (Signed)
Called patient to confirm appointments tomorrow. Instructed patient to bring her bottle of Ribociclib and medication diaries in case any changes have to be made.  Reminded patient the purpose of the visit is for provider to determine if she has healed sufficiently from surgery to resume Ribociclib dosing. Patient verbalized understanding and confirmed appointment and instructions.  Foye Spurling, BSN, RN Clinical Research Nurse 03/14/2019 4:50 PM

## 2019-03-15 ENCOUNTER — Other Ambulatory Visit: Payer: Self-pay

## 2019-03-15 ENCOUNTER — Encounter: Payer: Self-pay | Admitting: Adult Health

## 2019-03-15 ENCOUNTER — Encounter: Payer: Self-pay | Admitting: *Deleted

## 2019-03-15 ENCOUNTER — Inpatient Hospital Stay (HOSPITAL_BASED_OUTPATIENT_CLINIC_OR_DEPARTMENT_OTHER): Payer: 59 | Admitting: Adult Health

## 2019-03-15 ENCOUNTER — Inpatient Hospital Stay: Payer: 59

## 2019-03-15 VITALS — BP 125/79 | HR 86 | Temp 98.4°F | Resp 18 | Wt 269.3 lb

## 2019-03-15 DIAGNOSIS — Z483 Aftercare following surgery for neoplasm: Secondary | ICD-10-CM | POA: Diagnosis not present

## 2019-03-15 DIAGNOSIS — M25612 Stiffness of left shoulder, not elsewhere classified: Secondary | ICD-10-CM | POA: Diagnosis not present

## 2019-03-15 DIAGNOSIS — R252 Cramp and spasm: Secondary | ICD-10-CM | POA: Diagnosis not present

## 2019-03-15 DIAGNOSIS — Z170 Estrogen receptor positive status [ER+]: Secondary | ICD-10-CM

## 2019-03-15 DIAGNOSIS — C50812 Malignant neoplasm of overlapping sites of left female breast: Secondary | ICD-10-CM

## 2019-03-15 DIAGNOSIS — M6281 Muscle weakness (generalized): Secondary | ICD-10-CM | POA: Diagnosis not present

## 2019-03-15 DIAGNOSIS — M25512 Pain in left shoulder: Secondary | ICD-10-CM | POA: Diagnosis not present

## 2019-03-15 DIAGNOSIS — Z006 Encounter for examination for normal comparison and control in clinical research program: Secondary | ICD-10-CM | POA: Diagnosis not present

## 2019-03-15 DIAGNOSIS — R278 Other lack of coordination: Secondary | ICD-10-CM | POA: Diagnosis not present

## 2019-03-15 DIAGNOSIS — Z9189 Other specified personal risk factors, not elsewhere classified: Secondary | ICD-10-CM | POA: Diagnosis not present

## 2019-03-15 LAB — CBC WITH DIFFERENTIAL (CANCER CENTER ONLY)
Abs Immature Granulocytes: 0.03 10*3/uL (ref 0.00–0.07)
Basophils Absolute: 0 10*3/uL (ref 0.0–0.1)
Basophils Relative: 0 %
Eosinophils Absolute: 0.2 10*3/uL (ref 0.0–0.5)
Eosinophils Relative: 3 %
HCT: 36.3 % (ref 36.0–46.0)
Hemoglobin: 11.7 g/dL — ABNORMAL LOW (ref 12.0–15.0)
Immature Granulocytes: 0 %
Lymphocytes Relative: 16 %
Lymphs Abs: 1.2 10*3/uL (ref 0.7–4.0)
MCH: 28.1 pg (ref 26.0–34.0)
MCHC: 32.2 g/dL (ref 30.0–36.0)
MCV: 87.3 fL (ref 80.0–100.0)
Monocytes Absolute: 0.8 10*3/uL (ref 0.1–1.0)
Monocytes Relative: 10 %
Neutro Abs: 5.6 10*3/uL (ref 1.7–7.7)
Neutrophils Relative %: 71 %
Platelet Count: 277 10*3/uL (ref 150–400)
RBC: 4.16 MIL/uL (ref 3.87–5.11)
RDW: 14.6 % (ref 11.5–15.5)
WBC Count: 7.9 10*3/uL (ref 4.0–10.5)
nRBC: 0 % (ref 0.0–0.2)

## 2019-03-15 LAB — CMP (CANCER CENTER ONLY)
ALT: 157 U/L — ABNORMAL HIGH (ref 0–44)
AST: 63 U/L — ABNORMAL HIGH (ref 15–41)
Albumin: 3.8 g/dL (ref 3.5–5.0)
Alkaline Phosphatase: 128 U/L — ABNORMAL HIGH (ref 38–126)
Anion gap: 7 (ref 5–15)
BUN: 14 mg/dL (ref 6–20)
CO2: 27 mmol/L (ref 22–32)
Calcium: 9.2 mg/dL (ref 8.9–10.3)
Chloride: 103 mmol/L (ref 98–111)
Creatinine: 0.87 mg/dL (ref 0.44–1.00)
GFR, Est AFR Am: >60 mL/min (ref >60)
GFR, Estimated: >60 mL/min (ref >60)
Glucose, Bld: 112 mg/dL — ABNORMAL HIGH (ref 70–99)
Potassium: 3.7 mmol/L (ref 3.5–5.1)
Sodium: 137 mmol/L (ref 135–145)
Total Bilirubin: 0.3 mg/dL (ref 0.3–1.2)
Total Protein: 7.5 g/dL (ref 6.5–8.1)

## 2019-03-15 NOTE — Research (Signed)
Mackenzie [name], Cycle 11, Day 9.  Post-Op follow up visit to resume Ribociclib. Patient into clinic this morning to see [name] for post-op assessment to determine if  patient can resume Ribociclib.  Ribociclib was on hold pre and post operatively. According to the [name] protocol, [name] drug can be resumed at provider's discretion. Dr. Jana Hakim held Ribociclib from 02/22/19 (last dose was 02/21/19) and instructed for [name] to assess patient approximately 10 days post operatively. Today is day 11 post-op.   See Mendel Ryder Causey's encounter note dated today for more details. Concomitant Medications- Reviewed with patient and medication list is up to date.  Patient is not currently taking any prohibited medications per protocol.  She did take Ondansetron for nausea but stopped 03/09/19.  Although patient was given some medications peri-operatively that are prohibited per [name] protocol, patient had not taken the Ribociclib for 10 days prior to surgery so there is no potential interaction of these prohibited medications with [name] drug.  Adverse Events- Nausea (Grade 1) 03/03/19- 03/09/19;  Patient did not vomit and it did not interfere with her ability to eat. Patient took ondansetron for the nausea.  Breast Pain (Grade 2) Ongoing; Breast pain and tightness continues post op. Patient still has expanders in place and implants were not placed yet as planned.  Patient continues to take hydrocodone/acetaminophen and diazepam for pain.  Labs-  Anemia (grade 1) 03/15/19- ongoing.  Increased AST ((grade 1)  03/15/19- ongoing.  Increased Alkaline Phosphatase (grade 1) 03/15/19- ongoing.  Increased ALT (Grade 2) - 03/15/19-Per protocol need to hold Ribociclib until back to baseline.  Per Dr. Jana Hakim, this is not related to Ribociclib, especially in light of patient has not taken the drug in 21 days.  He says it is more likely related to anesthesia.  Plan- Instructed patient to continue to hold Ribociclib  until lab work is rechecked and further instructions. Per [name] and Dr. Jana Hakim, recheck CMET in 2 weeks. Patient instructed to be careful to not exceed recommended doses of acetaminophen and avoid alcohol. She verbalized understanding.  Note regarding annual Mammograms-  Last mammogram was done on 03/16/18 and due again 03/17/19.  Patient was unable to have mammogram completed before her recent surgery of 03/03/19 since it was too soon for insurance to cover.  Patient states she was told by surgeon she will not be able to have another mammogram for at least 6 months due to recent bilateral breast surgery.   Foye Spurling, BSN, RN Clinical Research Nurse 03/15/2019 4:39 PM

## 2019-03-17 ENCOUNTER — Other Ambulatory Visit: Payer: Self-pay | Admitting: *Deleted

## 2019-03-17 DIAGNOSIS — C50812 Malignant neoplasm of overlapping sites of left female breast: Secondary | ICD-10-CM

## 2019-03-17 DIAGNOSIS — Z170 Estrogen receptor positive status [ER+]: Secondary | ICD-10-CM

## 2019-03-24 ENCOUNTER — Telehealth: Payer: Self-pay

## 2019-03-24 NOTE — Telephone Encounter (Signed)

## 2019-03-25 ENCOUNTER — Other Ambulatory Visit: Payer: Self-pay

## 2019-03-25 ENCOUNTER — Encounter: Payer: Self-pay | Admitting: Surgical

## 2019-03-25 ENCOUNTER — Encounter: Payer: 59 | Admitting: Plastic Surgery

## 2019-03-25 ENCOUNTER — Ambulatory Visit (INDEPENDENT_AMBULATORY_CARE_PROVIDER_SITE_OTHER): Payer: 59 | Admitting: Surgical

## 2019-03-25 ENCOUNTER — Encounter: Payer: Self-pay | Admitting: Plastic Surgery

## 2019-03-25 VITALS — BP 158/102 | HR 79 | Temp 97.1°F | Ht 66.0 in | Wt 271.6 lb

## 2019-03-25 DIAGNOSIS — Z9012 Acquired absence of left breast and nipple: Secondary | ICD-10-CM

## 2019-03-25 DIAGNOSIS — C773 Secondary and unspecified malignant neoplasm of axilla and upper limb lymph nodes: Secondary | ICD-10-CM

## 2019-03-25 DIAGNOSIS — C50912 Malignant neoplasm of unspecified site of left female breast: Secondary | ICD-10-CM

## 2019-03-25 DIAGNOSIS — C50812 Malignant neoplasm of overlapping sites of left female breast: Secondary | ICD-10-CM

## 2019-03-25 DIAGNOSIS — Z170 Estrogen receptor positive status [ER+]: Secondary | ICD-10-CM

## 2019-03-25 NOTE — Progress Notes (Signed)
   Subjective:     Patient ID: Mackenzie [name], female    DOB: 02/27/1971, 48 y.o.   MRN: [id number]  Chief Complaint  Patient presents with  . Follow-up    2 weeks    HPI: The patient is a 48 y.o. female here for follow-up after removal of left tissue expander for new expander, left capsulotomy, right breast reduction/mastopexy for symmetry.   She reports she has been doing well. Right breast healing well post reduction. Incisions c/d/i. No dehiscence or drainage noted. Upon removal of steri-strips - a small skin tear was created.   Left breast latissimus flap with expander in place is healing well. There is no opening. Incision c/d/i. No drainage noted.  Denies fever, chills, n/v. No complaints at this time. Tolerated last fill without any issues. She has been walking.   Review of Systems  Constitutional: Negative for chills, diaphoresis, fever and malaise/fatigue.  Respiratory: Negative for cough, shortness of breath and wheezing.   Cardiovascular: Negative for chest pain and leg swelling.  Gastrointestinal: Negative for nausea and vomiting.  Musculoskeletal: Negative for back pain, myalgias and neck pain.  Skin: Negative for itching and rash.  Neurological: Positive for sensory change. Negative for dizziness, weakness and headaches.     Objective:   Vital Signs BP (!) 158/102 (BP Location: Right Arm, Patient Position: Sitting, Cuff Size: Large)   Pulse 79   Temp (!) 97.1 F (36.2 C) (Temporal)   Ht 5\' 6"  (1.676 m)   Wt 271 lb 9.6 oz (123.2 kg)   SpO2 99%   BMI 43.84 kg/m  Vital Signs and Nursing Note Reviewed Chaperone present Physical Exam  Constitutional: She is oriented to person, place, and time and well-developed, well-nourished, and in no distress.  HENT:  Head: Normocephalic and atraumatic.  Cardiovascular: Normal rate.  Pulmonary/Chest: Effort normal. No respiratory distress.    Musculoskeletal: Normal range of motion.        General: No tenderness,  deformity or edema.  Neurological: She is alert and oriented to person, place, and time. Gait normal.  Skin: Skin is warm and dry. No rash noted. She is not diaphoretic. No erythema. No pallor.  Psychiatric: Mood and affect normal.      Assessment/Plan:     ICD-10-CM   1. Malignant 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, estrogen receptor positive ([place])  C50.812    Z17.0   2. 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 Acquired absence of left breast  Z90.12    Mrs. Staloch is doing well, no sign of infection, seroma, hematoma. She is healing well.  Xeroform for 2-5 days on right breast new skin tear from steri-strip then Vaseline daily until healed. The tear is superficial.   We placed injectable saline in the Expander using a sterile technique: Left: 100 cc for a total of 550 / 650 cc  Plan for additional fill in 2 weeks and schedule for exchange early December.  Patient can return to work on light duty. Continue to eat healthy, walk, drink water.  PT eval for left arm tightness.  Follow up in 2 weeks.  Carola Rhine Laszlo Ellerby, PA-C 03/25/2019, 1:07 PM

## 2019-03-28 ENCOUNTER — Telehealth: Payer: Self-pay | Admitting: *Deleted

## 2019-03-28 NOTE — Telephone Encounter (Signed)
Called patient to remind her of lab appointment tomorrow morning at 9 am.  Informed patient she is not required to fast for this appointment and that she may leave after appointment.  Research nurse will review her results with Dr. Jana Hakim and call her with further instructions. Asked patient to call back if she cannot make this appointment tomorrow or any questions.  Foye Spurling, BSN, RN Clinical Research Nurse 03/28/2019 1:53 PM

## 2019-03-28 NOTE — Telephone Encounter (Signed)
-----   Message from Cathlean Cower, RN sent at 03/17/2019  1:51 PM EDT ----- Regarding: call patient to remind of labs lab on 10/20

## 2019-03-29 ENCOUNTER — Other Ambulatory Visit: Payer: Self-pay

## 2019-03-29 ENCOUNTER — Inpatient Hospital Stay: Payer: 59

## 2019-03-29 ENCOUNTER — Telehealth: Payer: Self-pay | Admitting: *Deleted

## 2019-03-29 ENCOUNTER — Other Ambulatory Visit: Payer: Self-pay | Admitting: Adult Health

## 2019-03-29 ENCOUNTER — Encounter: Payer: Self-pay | Admitting: Physical Therapy

## 2019-03-29 ENCOUNTER — Ambulatory Visit: Payer: 59 | Attending: Surgical | Admitting: Physical Therapy

## 2019-03-29 DIAGNOSIS — R252 Cramp and spasm: Secondary | ICD-10-CM | POA: Diagnosis not present

## 2019-03-29 DIAGNOSIS — M6281 Muscle weakness (generalized): Secondary | ICD-10-CM | POA: Insufficient documentation

## 2019-03-29 DIAGNOSIS — M25512 Pain in left shoulder: Secondary | ICD-10-CM | POA: Diagnosis not present

## 2019-03-29 DIAGNOSIS — Z9189 Other specified personal risk factors, not elsewhere classified: Secondary | ICD-10-CM | POA: Insufficient documentation

## 2019-03-29 DIAGNOSIS — M25612 Stiffness of left shoulder, not elsewhere classified: Secondary | ICD-10-CM | POA: Insufficient documentation

## 2019-03-29 DIAGNOSIS — R278 Other lack of coordination: Secondary | ICD-10-CM | POA: Diagnosis not present

## 2019-03-29 DIAGNOSIS — Z483 Aftercare following surgery for neoplasm: Secondary | ICD-10-CM | POA: Insufficient documentation

## 2019-03-29 DIAGNOSIS — Z170 Estrogen receptor positive status [ER+]: Secondary | ICD-10-CM

## 2019-03-29 DIAGNOSIS — C50812 Malignant neoplasm of overlapping sites of left female breast: Secondary | ICD-10-CM

## 2019-03-29 LAB — CBC WITH DIFFERENTIAL (CANCER CENTER ONLY)
Abs Immature Granulocytes: 0.01 10*3/uL (ref 0.00–0.07)
Basophils Absolute: 0 10*3/uL (ref 0.0–0.1)
Basophils Relative: 1 %
Eosinophils Absolute: 0.1 10*3/uL (ref 0.0–0.5)
Eosinophils Relative: 3 %
HCT: 39.3 % (ref 36.0–46.0)
Hemoglobin: 12.6 g/dL (ref 12.0–15.0)
Immature Granulocytes: 0 %
Lymphocytes Relative: 25 %
Lymphs Abs: 1.2 10*3/uL (ref 0.7–4.0)
MCH: 27.6 pg (ref 26.0–34.0)
MCHC: 32.1 g/dL (ref 30.0–36.0)
MCV: 86 fL (ref 80.0–100.0)
Monocytes Absolute: 0.6 10*3/uL (ref 0.1–1.0)
Monocytes Relative: 13 %
Neutro Abs: 2.7 10*3/uL (ref 1.7–7.7)
Neutrophils Relative %: 58 %
Platelet Count: 253 10*3/uL (ref 150–400)
RBC: 4.57 MIL/uL (ref 3.87–5.11)
RDW: 14.2 % (ref 11.5–15.5)
WBC Count: 4.6 10*3/uL (ref 4.0–10.5)
nRBC: 0 % (ref 0.0–0.2)

## 2019-03-29 LAB — CMP (CANCER CENTER ONLY)
ALT: 122 U/L — ABNORMAL HIGH (ref 0–44)
AST: 63 U/L — ABNORMAL HIGH (ref 15–41)
Albumin: 3.9 g/dL (ref 3.5–5.0)
Alkaline Phosphatase: 135 U/L — ABNORMAL HIGH (ref 38–126)
Anion gap: 10 (ref 5–15)
BUN: 17 mg/dL (ref 6–20)
CO2: 24 mmol/L (ref 22–32)
Calcium: 9.6 mg/dL (ref 8.9–10.3)
Chloride: 103 mmol/L (ref 98–111)
Creatinine: 0.91 mg/dL (ref 0.44–1.00)
GFR, Est AFR Am: >60 mL/min (ref >60)
GFR, Estimated: >60 mL/min (ref >60)
Glucose, Bld: 132 mg/dL — ABNORMAL HIGH (ref 70–99)
Potassium: 4 mmol/L (ref 3.5–5.1)
Sodium: 137 mmol/L (ref 135–145)
Total Bilirubin: 0.4 mg/dL (ref 0.3–1.2)
Total Protein: 7.8 g/dL (ref 6.5–8.1)

## 2019-03-29 NOTE — Therapy (Signed)
[facility], [place], [address] Phone: [phone]   Fax:  [phone]  Physical Therapy Evaluation  Patient Details  Name: Mackenzie [name] MRN: [id number] Date of Birth: 1971-05-31 Referring Provider (PT): Dillingham   Encounter Date: 03/29/2019  PT End of Session - 03/29/19 1543    Visit Number  1    Number of Visits  9    Date for PT Re-Evaluation  04/26/19    PT Start Time  1506    PT Stop Time  1541    PT Time Calculation (min)  35 min    Activity Tolerance  Patient tolerated treatment well    Behavior During Therapy  [facility] for tasks assessed/performed       Past Medical History:  Diagnosis Date  . Abnormal glucose 2018  . 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 Allergic rhinitis 2012  . Anemia 01/27/2017   prior to starting chemotherapy  . Breast cancer ([place]) 01/14/2017   Left breast  . 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 Hot flashes 03/2017  . Hyperlipidemia 03/20/2016  . Morbid obesity with body mass index (BMI) of 40.0 to 49.9 ([place]) 09/17/2017  . NCGS (non-celiac gluten sensitivity)   . Pre-diabetes   . Seasonal allergies 2012   seasonal allergies causes allergic rhinitis and itchy, dry eyes per pt  . Vitamin D deficiency 05/2015    Past Surgical History:  Procedure Laterality Date  . BREAST RECONSTRUCTION WITH PLACEMENT OF TISSUE EXPANDER AND FLEX HD (ACELLULAR HYDRATED DERMIS) Left 08/27/2017   Procedure: LEFT BREAST RECONSTRUCTION WITH PLACEMENT OF TISSUE EXPANDER AND FLEX HD;  Surgeon: Wallace Going, DO;  Location: [place];  Service: Plastics;  Laterality: Left;  . CESAREAN SECTION     x2  . LATISSIMUS FLAP TO BREAST Left 11/03/2018  . LATISSIMUS FLAP TO BREAST Left 11/03/2018   Procedure: LATISSIMUS FLAP TO LEFT BREAST;  Surgeon: Wallace Going, DO;  Location: [place];  Service: Plastics;  Laterality: Left;  Marland Kitchen MASTECTOMY  MODIFIED RADICAL Left 08/27/2017  . MASTECTOMY MODIFIED RADICAL Left 08/27/2017   Procedure: LEFT MODIFIED RADICAL MASTECTOMY;  Surgeon: Erroll Luna, MD;  Location: [place];  Service: General;  Laterality: Left;  Marland Kitchen MASTOPEXY Right 03/03/2019   Procedure: RIGHT BREAST MASTOPEXY/REDUCTION;  Surgeon: Wallace Going, DO;  Location: [place];  Service: Plastics;  Laterality: Right;  3 hours, please  . PORT-A-CATH REMOVAL Right 08/27/2017   Procedure: REMOVAL PORT-A-CATH RIGHT CHEST;  Surgeon: Erroll Luna, MD;  Location: [place];  Service: General;  Laterality: Right;  . PORTACATH PLACEMENT Right 01/28/2017   Procedure: INSERTION PORT-A-CATH WITH ULTRASOUND;  Surgeon: Erroll Luna, MD;  Location: [place];  Service: General;  Laterality: Right;  . REMOVAL OF TISSUE EXPANDER AND PLACEMENT OF IMPLANT Left 03/03/2019   Procedure: REMOVAL OF TISSUE EXPANDER AND PLACEMENT OF EXPANDER;  Surgeon: Wallace Going, DO;  Location: [place];  Service: Plastics;  Laterality: Left;  . TISSUE EXPANDER PLACEMENT Left 11/03/2018   Procedure: PLACEMENT OF TISSUE EXPANDER LEFT BREAST;  Surgeon: Wallace Going, DO;  Location: [place];  Service: Plastics;  Laterality: Left;  Total case time is 3.5 hours  . TUBAL LIGATION Bilateral 01/21/2011    There were no vitals filed for this visit.   Subjective Assessment - 03/29/19 1510    Subjective  It is hard for me to reach back and  unhook my bra. I have trouble opening jars and lifting my arm to a certain level hurts. This is my 3rd expander. I want to get my strength back because I have pain in the area of the lat flap when I walk.    Pertinent History  Left breast cancer diagnosed 01/10/17 by mammogram and needle biopsy. Then had neo-adjuvant chemo starting 02/10/17 until 06/26/17; had left mastectomy 08/27/17 with ALND and immediate expander placed. Radiation completed. Expander removed and replaced on May 27, 19  b/c it did not get expanded enough prior to radiation, also had lat flap 11/02/17. Had expander removed and replaced again on 03/03/19 due to a small leak, pt had reduction on R breast at this time    Patient Stated Goals  to get strength back and ROM, be able to unhook bra easily    Currently in Pain?  No/denies    Pain Score  0-No pain         [facility] PT Assessment - 03/29/19 0001      Assessment   Medical Diagnosis  left breast cancer with mastectomy and ALND    Referring Provider (PT)  Dillingham    Onset Date/Surgical Date  03/03/19    Hand Dominance  Right    Prior Therapy  PT in April 2019 for ROM      Precautions   Precautions  Other (comment)    Precaution Comments  at risk for lymphedema      Restrictions   Weight Bearing Restrictions  No      Balance Screen   Has the patient fallen in the past 6 months  No    Has the patient had a decrease in activity level because of a fear of falling?   No    Is the patient reluctant to leave their home because of a fear of falling?   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    Leisure  Has been walking 2-3 miles/day, 3-4 days/week      Cognition   Overall Cognitive Status  Within Functional Limits for tasks assessed      Observation/Other Assessments   Observations  scar healed and intact, tightness palpated in left axilla      ROM / Strength   AROM / PROM / Strength  AROM      AROM   Right Shoulder Flexion  156 Degrees    Right Shoulder ABduction  161 Degrees    Right Shoulder Internal Rotation  60 Degrees    Right Shoulder External Rotation  78 Degrees    Left Shoulder Flexion  137 Degrees    Left Shoulder ABduction  135 Degrees     Left Shoulder Internal Rotation  49 Degrees    Left Shoulder External Rotation  78 Degrees        LYMPHEDEMA/ONCOLOGY QUESTIONNAIRE - 03/29/19 1525      Type   Cancer Type  left breast      Surgeries   Mastectomy Date  08/27/17    Other Surgery Date  03/03/19   11/03/18 expander placed  both times   Number Lymph Nodes Removed  4   1 was positive     Treatment   Active Chemotherapy Treatment  No    Past Chemotherapy Treatment  Yes    Active Radiation Treatment  No    Past Radiation Treatment  Yes    Current Hormone Treatment  Yes    Drug Name  Letrozole    Past Hormone Therapy  No      What other symptoms do you have   Are you Having Heaviness or Tightness  Yes    Are you having Pain  Yes    Are you having pitting edema  No    Is it Hard or Difficult finding clothes that fit  No    Do you have infections  No    Is there Decreased scar mobility  Yes      Right Upper Extremity Lymphedema   15 cm Proximal to Olecranon Process  40.5 cm    10 cm Proximal to Olecranon Process  39.5 cm    Olecranon Process  33.7 cm    15 cm Proximal to Ulnar Styloid Process  29.5 cm    10 cm Proximal to Ulnar Styloid Process  26 cm    Just Proximal to Ulnar Styloid Process  20.5 cm    Across Hand at [employer]  21.6 cm    At [place] of 2nd Digit  6.8 cm      Left Upper Extremity Lymphedema   15 cm Proximal to Olecranon Process  42.3 cm    10 cm Proximal to Olecranon Process  41 cm    Olecranon Process  33 cm    15 cm Proximal to Ulnar Styloid Process  30.9 cm    10 cm Proximal to Ulnar Styloid Process  26.5 cm    Just Proximal to Ulnar Styloid Process  21 cm    Across Hand at [employer]  21.5 cm    At [place] of 2nd Digit  6.8 cm             Objective measurements completed on examination: See above findings.      [facility] Adult PT Treatment/Exercise - 03/29/19 0001      Shoulder Exercises: Supine   Horizontal ABduction  AAROM;Left;10 reps   with dowel with 5 sec holds  and v/c to keep arm flat   Flexion  AAROM;Both;10 reps   with dowel with 5 sec holds with v/c to keep elbows straight                 PT Long Term Goals - 03/29/19 1554      PT LONG TERM GOAL #1   Title  Pt will demonstrate 160 degrees of left shoulder flexion to allow her to reach overhead.    Baseline  137    Time  4    Period  Weeks    Status  New    Target Date  04/26/19      PT LONG TERM GOAL #2   Title  Pt will demonstrate 160 degrees of left shoulder abduction to allow her to reach out to the side    Baseline  135    Time  4    Period  Weeks    Status  New    Target Date  04/26/19      PT LONG TERM GOAL #3   Title  Pt will report an 80% improvement in pain and  tightness in L axilla to allow pt to move LUE without discomfort.    Time  4    Period  Weeks    Status  New    Target Date  04/26/19      PT LONG TERM GOAL #4   Title  Pt will be independent with a home exercise program for continued strengthening and stretching    Time  4    Period  Weeks    Status  New    Target Date  04/26/19      PT LONG TERM GOAL #5   Title  Pt will report a 50% improvement in back pain in area of lat flap when walking to allow improved comfort    Time  4    Period  Weeks    Status  New    Target Date  04/26/19             Plan - 03/29/19 1544    Clinical Impression Statement  Pt presents to PT with decreased left shoulder ROM following her 3rd expander replacement on 03/03/19. She underwent a left mastectomy and ALND on 08/27/17 then completed radiation and chemo. She had her expander replaced on 11/03/18 and reports she has had decreased L shoulder ROM since that time. That expander developed a leak and she had to have it replaced again on 03/03/19 and also underwent a R breast reduction at that time. She is approximately 30 degrees more limited with left shoulder flexion and abduction. She has trouble hooking her bra strap and carrying things. 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 She will benefit from skilled PT services to improve L shoulder ROM and strength. She reports she also has pain in the area of her lat flap when she walks so we will address that as well.    Personal Factors and Comorbidities  Profession;Time since onset of injury/illness/exacerbation    Examination-Activity Limitations  Caring for Others;Reach Overhead;Carry;Dressing    Examination-Participation Restrictions  Community Activity;Cleaning    Stability/Clinical Decision Making  Evolving/Moderate complexity   pt to have another surgery this year to have implant placed   Clinical Decision Making  Moderate    Rehab Potential  Good    Clinical Impairments Affecting Rehab Potential  hx of radiation    PT Frequency  2x / week    PT Duration  4 weeks    PT Treatment/Interventions  ADLs/Self Care Home Management;Therapeutic exercise;Patient/family education;Manual techniques;Manual lymph drainage;Compression bandaging;Scar mobilization;Passive range of motion;Therapeutic activities    PT Home Exercise Plan  begin PROM to L shoulder, give AAROM exercises, eventually teach Strength ABC, check for signed Rx for compression sleeve and monitor LUE circumference since it is currently borderline and she is returning to work    Recommended Other Services  send Rx to be signed for prophylactic    Consulted and Agree with Plan of Care  Patient       Patient will benefit from skilled therapeutic intervention in order to improve the following deficits and impairments:  Decreased range of motion, Impaired UE functional use, Decreased strength, Other (comment), Pain, Postural dysfunction  Visit Diagnosis: Stiffness of left shoulder, not elsewhere classified  Muscle weakness (generalized)  Aftercare following surgery for neoplasm  Acute pain of left shoulder     Problem List Patient Active Problem List   Diagnosis Date Noted  . Benign  essential HTN 08/17/2018  . Morbid obesity with  body mass index (BMI) of 40.0 to 49.9 ([place]) 09/17/2017  . 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 Malignant neoplasm of upper-inner quadrant of left breast in female, estrogen receptor positive ([place]) 07/27/2017  . Genetic testing 03/25/2017  . Malignant 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, estrogen receptor positive ([place]) 01/23/2017  . 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

## 2019-03-29 NOTE — Telephone Encounter (Signed)
Mackenzie [name] Patient returned for lab appointment this morning to re-evaluate elevated liver function tests from 2 weeks ago. CBC was also done and results were wnl.  CMET abnormal results as follows: Increased AST- continues at Grade 1- ongoing. Increased Alkaline phosphatase- continues at Grade 1- ongoing. Increased ALT- Decreased to Grade 1 from Grade 2, 03/29/19- ongoing;  Per protocol, this needs to decrease to baseline before patient can resume Ribociclib dosing.   Reviewed lab results and [name] protocol with [name], NP.  She agrees to continue to hold Ribociclib and she also ordered US liver to be done this week to evaluate ongoing elevated LFTs. She instructed for patient to keep appointments as scheduled on 04/05/19 with CBC and CMET.  Cycle 12 of Ribociclib and Zoladex due to start on 04/04/19, but appointments were moved to 04/05/19 due to scheduling issues. Dr. Jana Hakim does not have availability and [name] is out of office on 04/04/19.  Called patient to inform her of lab results, continue to hold Ribociclib, [name] of liver ordered and keep appointments for next week on 04/05/19 as scheduled.  Instructed patient to call if any questions or concerns prior to next visit. She verbalized understanding.  Foye Spurling, BSN, RN Clinical Research Nurse 03/29/2019 12:08 PM

## 2019-03-29 NOTE — Patient Instructions (Signed)
Shoulder: Flexion (Supine)    With hands shoulder width apart, slowly lower dowel to floor behind head. Do not let elbows bend. Keep back flat. Hold _5-30___ seconds. Repeat _10___ times. Do __2__ sessions per day. CAUTION: Stretch slowly and gently.  Copyright  VHI. All rights reserved.  Shoulder: Abduction (Supine)    With left arm flat on floor, hold dowel in palm. Slowly move arm up to side of head by pushing with opposite arm. Do not let elbow bend. Hold _5=30___ seconds. Repeat _10___ times. Do _2___ sessions per day. CAUTION: Stretch slowly and gently.  Copyright  VHI. All rights reserved.   

## 2019-03-29 NOTE — Progress Notes (Signed)
LFTs are persistently elevated at todays lab results.  Reviewed with [place] RN.  She will hold Ribociclib for the persistent increased LFTs, and we will get RUQ ultrasound to evaluate for etiology of LFT increase.    [name], NP

## 2019-03-31 ENCOUNTER — Telehealth: Payer: Self-pay | Admitting: *Deleted

## 2019-03-31 NOTE — Telephone Encounter (Signed)
Called radiology scheduling to get [name] of Abdomen scheduled. Requested soonest available appointment at either [facility] and patient was given appointment for Monday 03/29/19 at 9:30 am.   Patient to arrive at 9:15 am and NPO after midnight the night before.  Informed patient of appointment time and instructions. She verbalized understanding.  Foye Spurling, BSN, RN Clinical Research Nurse 03/31/2019 9:41 AM

## 2019-04-01 ENCOUNTER — Ambulatory Visit: Payer: 59 | Admitting: Physical Therapy

## 2019-04-01 ENCOUNTER — Encounter: Payer: Self-pay | Admitting: Physical Therapy

## 2019-04-01 ENCOUNTER — Other Ambulatory Visit: Payer: Self-pay

## 2019-04-01 DIAGNOSIS — M25512 Pain in left shoulder: Secondary | ICD-10-CM | POA: Diagnosis not present

## 2019-04-01 DIAGNOSIS — Z483 Aftercare following surgery for neoplasm: Secondary | ICD-10-CM | POA: Diagnosis not present

## 2019-04-01 DIAGNOSIS — R252 Cramp and spasm: Secondary | ICD-10-CM | POA: Diagnosis not present

## 2019-04-01 DIAGNOSIS — M25612 Stiffness of left shoulder, not elsewhere classified: Secondary | ICD-10-CM | POA: Diagnosis not present

## 2019-04-01 DIAGNOSIS — R278 Other lack of coordination: Secondary | ICD-10-CM | POA: Diagnosis not present

## 2019-04-01 DIAGNOSIS — Z9189 Other specified personal risk factors, not elsewhere classified: Secondary | ICD-10-CM | POA: Diagnosis not present

## 2019-04-01 DIAGNOSIS — M6281 Muscle weakness (generalized): Secondary | ICD-10-CM | POA: Diagnosis not present

## 2019-04-01 NOTE — Therapy (Signed)
[place], [place], [address] Phone: [phone]   Fax:  [phone]  Physical Therapy Treatment  Patient Details  Name: Mackenzie [name] MRN: [id number] Date of Birth: 1970-11-07 Referring Provider (PT): Dillingham   Encounter Date: 04/01/2019  PT End of Session - 04/01/19 0949    Visit Number  2    Number of Visits  9    Date for PT Re-Evaluation  04/26/19    PT Start Time  0905    PT Stop Time  0949    PT Time Calculation (min)  44 min    Activity Tolerance  Patient tolerated treatment well    Behavior During Therapy  [facility] for tasks assessed/performed       Past Medical History:  Diagnosis Date  . Abnormal glucose 2018  . 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 Allergic rhinitis 2012  . Anemia 01/27/2017   prior to starting chemotherapy  . Breast cancer ([place]) 01/14/2017   Left breast  . 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 Hot flashes 03/2017  . Hyperlipidemia 03/20/2016  . Morbid obesity with body mass index (BMI) of 40.0 to 49.9 ([place]) 09/17/2017  . NCGS (non-celiac gluten sensitivity)   . Pre-diabetes   . Seasonal allergies 2012   seasonal allergies causes allergic rhinitis and itchy, dry eyes per pt  . Vitamin D deficiency 05/2015    Past Surgical History:  Procedure Laterality Date  . BREAST RECONSTRUCTION WITH PLACEMENT OF TISSUE EXPANDER AND FLEX HD (ACELLULAR HYDRATED DERMIS) Left 08/27/2017   Procedure: LEFT BREAST RECONSTRUCTION WITH PLACEMENT OF TISSUE EXPANDER AND FLEX HD;  Surgeon: Wallace Going, DO;  Location: [facility];  Service: Plastics;  Laterality: Left;  . CESAREAN SECTION     x2  . LATISSIMUS FLAP TO BREAST Left 11/03/2018  . LATISSIMUS FLAP TO BREAST Left 11/03/2018   Procedure: LATISSIMUS FLAP TO LEFT BREAST;  Surgeon: Wallace Going, DO;  Location: [facility];  Service: Plastics;  Laterality: Left;  Marland Kitchen MASTECTOMY  MODIFIED RADICAL Left 08/27/2017  . MASTECTOMY MODIFIED RADICAL Left 08/27/2017   Procedure: LEFT MODIFIED RADICAL MASTECTOMY;  Surgeon: Erroll Luna, MD;  Location: [facility];  Service: General;  Laterality: Left;  Marland Kitchen MASTOPEXY Right 03/03/2019   Procedure: RIGHT BREAST MASTOPEXY/REDUCTION;  Surgeon: Wallace Going, DO;  Location: [facility];  Service: Plastics;  Laterality: Right;  3 hours, please  . PORT-A-CATH REMOVAL Right 08/27/2017   Procedure: REMOVAL PORT-A-CATH RIGHT CHEST;  Surgeon: Erroll Luna, MD;  Location: [facility];  Service: General;  Laterality: Right;  . PORTACATH PLACEMENT Right 01/28/2017   Procedure: INSERTION PORT-A-CATH WITH ULTRASOUND;  Surgeon: Erroll Luna, MD;  Location: [facility];  Service: General;  Laterality: Right;  . REMOVAL OF TISSUE EXPANDER AND PLACEMENT OF IMPLANT Left 03/03/2019   Procedure: REMOVAL OF TISSUE EXPANDER AND PLACEMENT OF EXPANDER;  Surgeon: Wallace Going, DO;  Location: [facility];  Service: Plastics;  Laterality: Left;  . TISSUE EXPANDER PLACEMENT Left 11/03/2018   Procedure: PLACEMENT OF TISSUE EXPANDER LEFT BREAST;  Surgeon: Wallace Going, DO;  Location: [facility];  Service: Plastics;  Laterality: Left;  Total case time is 3.5 hours  . TUBAL LIGATION Bilateral 01/21/2011    There were no vitals filed for this visit.  Subjective Assessment - 04/01/19 0908    Subjective  My shoulder is a little tight and it was tight  last night when I was doing my exercises.    Pertinent History  Left breast cancer diagnosed 01/10/17 by mammogram and needle biopsy. Then had neo-adjuvant chemo starting 02/10/17 until 06/26/17; had left mastectomy 08/27/17 with ALND and immediate expander placed. Radiation completed. Expander removed and replaced on May 27, 19 b/c it did not get expanded enough prior to radiation, also had lat flap 11/02/17. Had expander removed and replaced again on 03/03/19 due to a small leak,  pt had reduction on R breast at this time    Patient Stated Goals  to get strength back and ROM, be able to unhook bra easily    Currently in Pain?  No/denies    Pain Score  0-No pain                       [facility] Adult PT Treatment/Exercise - 04/01/19 0001      Manual Therapy   Manual Therapy  Soft tissue mobilization;Passive ROM    Soft tissue mobilization  to left axilla to help decrease tightness    Passive ROM  in supine to L shoulder in direction of flexion, abduction and ER with prolonged holds to increase ROM, pt limited by tightness                  PT Long Term Goals - 03/29/19 1554      PT LONG TERM GOAL #1   Title  Pt will demonstrate 160 degrees of left shoulder flexion to allow her to reach overhead.    Baseline  137    Time  4    Period  Weeks    Status  New    Target Date  04/26/19      PT LONG TERM GOAL #2   Title  Pt will demonstrate 160 degrees of left shoulder abduction to allow her to reach out to the side    Baseline  135    Time  4    Period  Weeks    Status  New    Target Date  04/26/19      PT LONG TERM GOAL #3   Title  Pt will report an 80% improvement in pain and tightness in L axilla to allow pt to move LUE without discomfort.    Time  4    Period  Weeks    Status  New    Target Date  04/26/19      PT LONG TERM GOAL #4   Title  Pt will be independent with a home exercise program for continued strengthening and stretching    Time  4    Period  Weeks    Status  New    Target Date  04/26/19      PT LONG TERM GOAL #5   Title  Pt will report a 50% improvement in back pain in area of lat flap when walking to allow improved comfort    Time  4    Period  Weeks    Status  New    Target Date  04/26/19            Plan - 04/01/19 0950    Clinical Impression Statement  Began PROM to L shoulder today with prolonged holds in to abduction, flexion and ER. Pt very tight in left axilla which limits PROM so performed soft  tissue mobilization to this area. Will begin AAROM exercises at next session.    Rehab Potential  Good  Clinical Impairments Affecting Rehab Potential  hx of radiation    PT Frequency  2x / week    PT Duration  4 weeks    PT Treatment/Interventions  ADLs/Self Care Home Management;Therapeutic exercise;Patient/family education;Manual techniques;Manual lymph drainage;Compression bandaging;Scar mobilization;Passive range of motion;Therapeutic activities    PT Next Visit Plan  continue PROM to L shoulder, give AAROM exercises, eventually teach Strength ABC, check for signed Rx for compression sleeve and monitor LUE circumference since it is currently borderline and she is returning to work       Patient will benefit from skilled therapeutic intervention in order to improve the following deficits and impairments:  Decreased range of motion, Impaired UE functional use, Decreased strength, Other (comment), Pain, Postural dysfunction  Visit Diagnosis: Stiffness of left shoulder, not elsewhere classified  Aftercare following surgery for neoplasm  Acute pain of left shoulder     Problem List Patient Active Problem List   Diagnosis Date Noted  . Benign essential HTN 08/17/2018  . Morbid obesity with body mass index (BMI) of 40.0 to 49.9 ([place]) 09/17/2017  . 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 Malignant neoplasm of upper-inner quadrant of left breast in female, estrogen receptor positive ([place]) 07/27/2017  . Genetic testing 03/25/2017  . Malignant 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, estrogen receptor positive ([place]) 01/23/2017  . 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

## 2019-04-04 ENCOUNTER — Other Ambulatory Visit: Payer: Self-pay

## 2019-04-04 ENCOUNTER — Telehealth: Payer: Self-pay | Admitting: *Deleted

## 2019-04-04 ENCOUNTER — Other Ambulatory Visit: Payer: 59

## 2019-04-04 ENCOUNTER — Ambulatory Visit (HOSPITAL_COMMUNITY)
Admission: RE | Admit: 2019-04-04 | Discharge: 2019-04-04 | Disposition: A | Discharge status: Home / Self Care | Payer: 59 | Source: Ambulatory Visit | Attending: Adult Health | Admitting: Adult Health

## 2019-04-04 ENCOUNTER — Ambulatory Visit: Payer: 59

## 2019-04-04 ENCOUNTER — Ambulatory Visit: Payer: 59 | Admitting: Adult Health

## 2019-04-04 DIAGNOSIS — K8020 Calculus of gallbladder without cholecystitis without obstruction: Secondary | ICD-10-CM | POA: Diagnosis not present

## 2019-04-04 DIAGNOSIS — Z170 Estrogen receptor positive status [ER+]: Secondary | ICD-10-CM | POA: Insufficient documentation

## 2019-04-04 DIAGNOSIS — C50812 Malignant neoplasm of overlapping sites of left female breast: Secondary | ICD-10-CM

## 2019-04-04 NOTE — Telephone Encounter (Signed)
-----   Message from Cathlean Cower, RN sent at 03/31/2019  4:01 PM EDT ----- Regarding: [name] results prep visit 10/27- call pt and remind to bring in all meds, diaries. will need to reduce dose.

## 2019-04-04 NOTE — Telephone Encounter (Signed)
[name]; [name] of abd/ liver results were reviewed with Dr. Jana Hakim. He says the results most likely indicate fatty liver but will order MRI as recommended by radiologist just to be sure.  He also says he thinks the elevated ALT is most likely related to fatty liver and/or possibly anesthesia from patient's surgery on 03/03/19.  He says the elevated ALT is not related to Ribociclib. Dr. Jana Hakim instructs ok to resume Ribociclib tomorrow regardless of ALT level. He says the ALT will probably stay elevated since he thinks it is related to fatty liver. Email sent to the study for guidance on this issue to manage patient within parameters of study protocol.  LVM for patient reminding her of appointments tomorrow. Reminded patient to bring in study drug bottles and diaries in case there is a dose decrease needed and we need to return drug to pharmacy.  Asked patient to return call if any questions.  Foye Spurling, BSN, RN Clinical Research Nurse 04/04/2019 4:57 PM

## 2019-04-05 ENCOUNTER — Inpatient Hospital Stay: Payer: 59

## 2019-04-05 ENCOUNTER — Inpatient Hospital Stay (HOSPITAL_BASED_OUTPATIENT_CLINIC_OR_DEPARTMENT_OTHER): Payer: 59 | Admitting: Adult Health

## 2019-04-05 ENCOUNTER — Encounter: Payer: Self-pay | Admitting: *Deleted

## 2019-04-05 ENCOUNTER — Other Ambulatory Visit: Payer: Self-pay

## 2019-04-05 ENCOUNTER — Other Ambulatory Visit: Payer: Self-pay | Admitting: Adult Health

## 2019-04-05 ENCOUNTER — Telehealth: Payer: Self-pay

## 2019-04-05 ENCOUNTER — Encounter: Payer: Self-pay | Admitting: Adult Health

## 2019-04-05 ENCOUNTER — Ambulatory Visit: Payer: 59

## 2019-04-05 VITALS — BP 114/80 | HR 97 | Temp 98.0°F | Resp 18 | Ht 66.0 in | Wt 267.9 lb

## 2019-04-05 DIAGNOSIS — M25612 Stiffness of left shoulder, not elsewhere classified: Secondary | ICD-10-CM | POA: Diagnosis not present

## 2019-04-05 DIAGNOSIS — R252 Cramp and spasm: Secondary | ICD-10-CM | POA: Diagnosis not present

## 2019-04-05 DIAGNOSIS — R278 Other lack of coordination: Secondary | ICD-10-CM

## 2019-04-05 DIAGNOSIS — C773 Secondary and unspecified malignant neoplasm of axilla and upper limb lymph nodes: Secondary | ICD-10-CM

## 2019-04-05 DIAGNOSIS — Z170 Estrogen receptor positive status [ER+]: Secondary | ICD-10-CM

## 2019-04-05 DIAGNOSIS — Z9189 Other specified personal risk factors, not elsewhere classified: Secondary | ICD-10-CM | POA: Diagnosis not present

## 2019-04-05 DIAGNOSIS — C50812 Malignant neoplasm of overlapping sites of left female breast: Secondary | ICD-10-CM

## 2019-04-05 DIAGNOSIS — C50912 Malignant neoplasm of unspecified site of left female breast: Secondary | ICD-10-CM

## 2019-04-05 DIAGNOSIS — M25512 Pain in left shoulder: Secondary | ICD-10-CM

## 2019-04-05 DIAGNOSIS — M6281 Muscle weakness (generalized): Secondary | ICD-10-CM | POA: Diagnosis not present

## 2019-04-05 DIAGNOSIS — Z006 Encounter for examination for normal comparison and control in clinical research program: Secondary | ICD-10-CM

## 2019-04-05 DIAGNOSIS — Z483 Aftercare following surgery for neoplasm: Secondary | ICD-10-CM

## 2019-04-05 LAB — CBC WITH DIFFERENTIAL (CANCER CENTER ONLY)
Abs Immature Granulocytes: 0.02 10*3/uL (ref 0.00–0.07)
Basophils Absolute: 0 10*3/uL (ref 0.0–0.1)
Basophils Relative: 0 %
Eosinophils Absolute: 0.1 10*3/uL (ref 0.0–0.5)
Eosinophils Relative: 2 %
HCT: 37.9 % (ref 36.0–46.0)
Hemoglobin: 12.2 g/dL (ref 12.0–15.0)
Immature Granulocytes: 0 %
Lymphocytes Relative: 23 %
Lymphs Abs: 1.1 10*3/uL (ref 0.7–4.0)
MCH: 28 pg (ref 26.0–34.0)
MCHC: 32.2 g/dL (ref 30.0–36.0)
MCV: 87.1 fL (ref 80.0–100.0)
Monocytes Absolute: 0.5 10*3/uL (ref 0.1–1.0)
Monocytes Relative: 10 %
Neutro Abs: 3 10*3/uL (ref 1.7–7.7)
Neutrophils Relative %: 65 %
Platelet Count: 207 10*3/uL (ref 150–400)
RBC: 4.35 MIL/uL (ref 3.87–5.11)
RDW: 13.9 % (ref 11.5–15.5)
WBC Count: 4.7 10*3/uL (ref 4.0–10.5)
nRBC: 0 % (ref 0.0–0.2)

## 2019-04-05 LAB — CMP (CANCER CENTER ONLY)
ALT: 100 U/L — ABNORMAL HIGH (ref 0–44)
AST: 45 U/L — ABNORMAL HIGH (ref 15–41)
Albumin: 3.7 g/dL (ref 3.5–5.0)
Alkaline Phosphatase: 141 U/L — ABNORMAL HIGH (ref 38–126)
Anion gap: 10 (ref 5–15)
BUN: 15 mg/dL (ref 6–20)
CO2: 25 mmol/L (ref 22–32)
Calcium: 9.2 mg/dL (ref 8.9–10.3)
Chloride: 105 mmol/L (ref 98–111)
Creatinine: 0.96 mg/dL (ref 0.44–1.00)
GFR, Est AFR Am: >60 mL/min (ref >60)
GFR, Estimated: >60 mL/min (ref >60)
Glucose, Bld: 132 mg/dL — ABNORMAL HIGH (ref 70–99)
Potassium: 4.4 mmol/L (ref 3.5–5.1)
Sodium: 140 mmol/L (ref 135–145)
Total Bilirubin: 0.4 mg/dL (ref 0.3–1.2)
Total Protein: 7.4 g/dL (ref 6.5–8.1)

## 2019-04-05 MED ORDER — GOSERELIN ACETATE 3.6 MG ~~LOC~~ IMPL
DRUG_IMPLANT | SUBCUTANEOUS | Status: AC
  Filled 2019-04-05: qty 3.6

## 2019-04-05 MED ORDER — GOSERELIN ACETATE 3.6 MG ~~LOC~~ IMPL
3.6000 mg | DRUG_IMPLANT | Freq: Once | SUBCUTANEOUS | Status: AC
  Administered 2019-04-05: 3.6 mg via SUBCUTANEOUS

## 2019-04-05 NOTE — Research (Addendum)
Mackenzie [name], Cycle 12, Day 2.   Patient into clinic this morning to see Mackenzie [name] for labs and assessment to determine if patient can resume Ribociclib. See Mackenzie [name]'s encounter note dated today for more details. Ribociclib has been held due to grade 2 elevated ALT which had not resolved to baseline as of last week. See note for discussion with Dr. Jana [name] yesterday. He determined that elevated ALT is not related to Ribociclib and instructed patient should resume dosing today. Requested guidance from [name] monitor and was instructed that patient could resume Ribociclib today at same dose as long as ALT is grade 1 or lower today. It does not need to resolve to baseline since it is not related to Ribociclib.  Called [name] monitor, Mackenzie [name] on phone with lab results today and she confirmed ok to resume today cycle 12, day 2 at same dose since ALT continues at grade 1 and not other worsening AEs.  Informed Mackenzie [name] of MRI ordered to evaluate possible cyst found on [name].  Mackenzie [name] says patient does not need to wait for results of MRI to resume Ribociclib.   Anemia (grade 1) 11/12/18-03/29/19- resolved .  Increased AST ((grade 1)  03/15/19- ongoing.  Increased Alkaline Phosphatase (grade 1) 03/15/19- ongoing.  Increased ALT (Grade 1) - 03/29/19- ongoing.  Plan- Dr. Virgie [name] instructions, Lab results and above guidance from [name] monitor all reviewed with Mackenzie Bihari, NP. Mackenzie Ryder agreed that it is ok for patient to start back on Ribociclib today. Next [name] visit is scheduled prior to patient starting on cycle 13 on 05/02/19. Instructed patient to resume taking Ribociclib today at same 400 mg dose (2 pills once daily). Reminded her to continue to complete drug diaries for Ribociclib and Letrozole.  Instructed to call Dr. Virgie [name] office or research RN if any questions or concerns prior to next visit.  She verbalized understanding.  Mackenzie [name], BSN, RN Clinical Research Nurse 04/05/2019 12:58 PM

## 2019-04-05 NOTE — Telephone Encounter (Signed)
Patient seen in the office today.  [name] results given as well as NP recommendation for MRI.

## 2019-04-05 NOTE — Addendum Note (Signed)
Addended by: [name] C on: 04/05/2019 12:01 PM   Modules accepted: Orders

## 2019-04-05 NOTE — Telephone Encounter (Signed)
-----   Message from Gardenia Phlegm, NP sent at 04/05/2019  7:45 AM EDT ----- Please call patient.  Ultrasound appears to show fatty liver, but we need to do MRI abdomen to be 100% sure.   ----- Message ----- From: Interface, Rad Results In Sent: 04/04/2019  11:01 AM EDT To: Gardenia Phlegm, NP

## 2019-04-05 NOTE — Therapy (Signed)
[place], [place], [address] Phone: [phone]   Fax:  [phone]  Physical Therapy Treatment  Patient Details  Name: Mackenzie [name] MRN: [id number] Date of Birth: January 14, 1971 Referring Provider (PT): Dillingham   Encounter Date: 04/05/2019  PT End of Session - 04/05/19 1513    Visit Number  3    Number of Visits  9    Date for PT Re-Evaluation  04/26/19    PT Start Time  [id number]    PT Stop Time  1551    PT Time Calculation (min)  40 min    Activity Tolerance  Patient tolerated treatment well    Behavior During Therapy  [facility] for tasks assessed/performed       Past Medical History:  Diagnosis Date  . Abnormal glucose 2018  . 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 Allergic rhinitis 2012  . Anemia 01/27/2017   prior to starting chemotherapy  . Breast cancer ([place]) 01/14/2017   Left breast  . 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 Hot flashes 03/2017  . Hyperlipidemia 03/20/2016  . Morbid obesity with body mass index (BMI) of 40.0 to 49.9 ([place]) 09/17/2017  . NCGS (non-celiac gluten sensitivity)   . Pre-diabetes   . Seasonal allergies 2012   seasonal allergies causes allergic rhinitis and itchy, dry eyes per pt  . Vitamin D deficiency 05/2015    Past Surgical History:  Procedure Laterality Date  . BREAST RECONSTRUCTION WITH PLACEMENT OF TISSUE EXPANDER AND FLEX HD (ACELLULAR HYDRATED DERMIS) Left 08/27/2017   Procedure: LEFT BREAST RECONSTRUCTION WITH PLACEMENT OF TISSUE EXPANDER AND FLEX HD;  Surgeon: Wallace Going, DO;  Location: [facility];  Service: Plastics;  Laterality: Left;  . CESAREAN SECTION     x2  . LATISSIMUS FLAP TO BREAST Left 11/03/2018  . LATISSIMUS FLAP TO BREAST Left 11/03/2018   Procedure: LATISSIMUS FLAP TO LEFT BREAST;  Surgeon: Wallace Going, DO;  Location: [facility];  Service: Plastics;  Laterality: Left;  Marland Kitchen MASTECTOMY  MODIFIED RADICAL Left 08/27/2017  . MASTECTOMY MODIFIED RADICAL Left 08/27/2017   Procedure: LEFT MODIFIED RADICAL MASTECTOMY;  Surgeon: Erroll Luna, MD;  Location: [facility];  Service: General;  Laterality: Left;  Marland Kitchen MASTOPEXY Right 03/03/2019   Procedure: RIGHT BREAST MASTOPEXY/REDUCTION;  Surgeon: Wallace Going, DO;  Location: [facility];  Service: Plastics;  Laterality: Right;  3 hours, please  . PORT-A-CATH REMOVAL Right 08/27/2017   Procedure: REMOVAL PORT-A-CATH RIGHT CHEST;  Surgeon: Erroll Luna, MD;  Location: [facility];  Service: General;  Laterality: Right;  . PORTACATH PLACEMENT Right 01/28/2017   Procedure: INSERTION PORT-A-CATH WITH ULTRASOUND;  Surgeon: Erroll Luna, MD;  Location: [facility];  Service: General;  Laterality: Right;  . REMOVAL OF TISSUE EXPANDER AND PLACEMENT OF IMPLANT Left 03/03/2019   Procedure: REMOVAL OF TISSUE EXPANDER AND PLACEMENT OF EXPANDER;  Surgeon: Wallace Going, DO;  Location: [facility];  Service: Plastics;  Laterality: Left;  . TISSUE EXPANDER PLACEMENT Left 11/03/2018   Procedure: PLACEMENT OF TISSUE EXPANDER LEFT BREAST;  Surgeon: Wallace Going, DO;  Location: [facility];  Service: Plastics;  Laterality: Left;  Total case time is 3.5 hours  . TUBAL LIGATION Bilateral 01/21/2011    There were no vitals filed for this visit.  Subjective Assessment - 04/05/19 1514    Subjective  Pt reports that she was a little sore after her  last session. She has no pain today but continues to feel stiff. She reports that she has been performing her exercises at home.    Pertinent History  Left breast cancer diagnosed 01/10/17 by mammogram and needle biopsy. Then had neo-adjuvant chemo starting 02/10/17 until 06/26/17; had left mastectomy 08/27/17 with ALND and immediate expander placed. Radiation completed. Expander removed and replaced on May 27, 19 b/c it did not get expanded enough prior to radiation, also had  lat flap 11/02/17. Had expander removed and replaced again on 03/03/19 due to a small leak, pt had reduction on R breast at this time    Patient Stated Goals  to get strength back and ROM, be able to unhook bra easily    Currently in Pain?  No/denies    Pain Score  0-No pain    Multiple Pain Sites  No                       [facility] Adult PT Treatment/Exercise - 04/05/19 0001      Shoulder Exercises: Supine   Flexion  AAROM;Both;10 reps   with dowel with 5 sec holds with v/c to keep elbows straight   Other Supine Exercises  Supine chest press 10x VC for movement discussed avoiding pain.     Other Supine Exercises  Supine ER stretch with hands behind head 2x 30 seconds w/VC to press elbows toward the table pain-free.       Shoulder Exercises: Seated   Other Seated Exercises  Bil shoulder ER with scapular squeeze 2x 10     Other Seated Exercises  Seated trunk rotation w/arms crossed with scapular stretch at end range 10x w/VC and demonstration for movement to improve ROM at the L shoulder      Shoulder Exercises: Standing   ABduction  AAROM;Left;10 reps    ABduction Limitations  5 second hold, VC to prevent trunk rotation and to avoid pain      Manual Therapy   Manual Therapy  Joint mobilization;Myofascial release;Passive ROM    Joint Mobilization  8x 8 sets posteror/inferior each direction grade III for capsule stretch and fluid exchange.     Myofascial Release  L axilla down to the L pelvis with abductoin and ER in supine    Passive ROM  In supine L shoulder abduction, flexion, ER and IR with end range stretch and oscillations.              PT Education - 04/05/19 1528    Education Details  Access Code: [email or web address], Pt was provided with new AAROM HEP progressing from initial.    Person(s) Educated  Patient    Methods  Explanation;Demonstration;Verbal cues;Handout    Comprehension  Verbalized understanding;Returned demonstration          PT Long Term Goals -  03/29/19 1554      PT LONG TERM GOAL #1   Title  Pt will demonstrate 160 degrees of left shoulder flexion to allow her to reach overhead.    Baseline  137    Time  4    Period  Weeks    Status  New    Target Date  04/26/19      PT LONG TERM GOAL #2   Title  Pt will demonstrate 160 degrees of left shoulder abduction to allow her to reach out to the side    Baseline  135    Time  4    Period  Weeks  Status  New    Target Date  04/26/19      PT LONG TERM GOAL #3   Title  Pt will report an 80% improvement in pain and tightness in L axilla to allow pt to move LUE without discomfort.    Time  4    Period  Weeks    Status  New    Target Date  04/26/19      PT LONG TERM GOAL #4   Title  Pt will be independent with a home exercise program for continued strengthening and stretching    Time  4    Period  Weeks    Status  New    Target Date  04/26/19      PT LONG TERM GOAL #5   Title  Pt will report a 50% improvement in back pain in area of lat flap when walking to allow improved comfort    Time  4    Period  Weeks    Status  New    Target Date  04/26/19            Plan - 04/05/19 1512    Clinical Impression Statement  Pt presents to physical therapy today with continued decreased in L shoulder ROM with ABD lacking more than other movement. HEP was updated with increased focus on abduction and discussed proper posture during shoulder activities in order to improve ROM. PROM was performed to the L shoulder with myofascial release in the L axilla in order to improve mobility along with oscillations for end range mobility. Pt will benefit from continued POC.    Personal Factors and Comorbidities  Profession;Time since onset of injury/illness/exacerbation    Examination-Activity Limitations  Caring for Others;Reach Overhead;Carry;Dressing    Examination-Participation Restrictions  Community Activity;Cleaning    Stability/Clinical Decision Making  Evolving/Moderate complexity     Rehab Potential  Good    Clinical Impairments Affecting Rehab Potential  hx of radiation    PT Frequency  2x / week    PT Duration  4 weeks    PT Treatment/Interventions  ADLs/Self Care Home Management;Therapeutic exercise;Patient/family education;Manual techniques;Manual lymph drainage;Compression bandaging;Scar mobilization;Passive range of motion;Therapeutic activities    PT Next Visit Plan  continue PROM to L shoulder, assess AAROM exercises, eventually teach Strength ABC, check for signed Rx for compression sleeve and monitor LUE circumference since it is currently borderline and she is returning to work    PT Home Exercise Plan  Access Code: [email or web address]    Consulted and Agree with Plan of Care  Patient       Patient will benefit from skilled therapeutic intervention in order to improve the following deficits and impairments:  Decreased range of motion, Impaired UE functional use, Decreased strength, Other (comment), Pain, Postural dysfunction  Visit Diagnosis: Stiffness of left shoulder, not elsewhere classified  Aftercare following surgery for neoplasm  Acute pain of left shoulder  Muscle weakness (generalized)  Other lack of coordination  Cramp and spasm  At risk for lymphedema     Problem List Patient Active Problem List   Diagnosis Date Noted  . Benign essential HTN 08/17/2018  . Morbid obesity with body mass index (BMI) of 40.0 to 49.9 ([place]) 09/17/2017  . 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 Malignant neoplasm of upper-inner quadrant of left breast in female, estrogen receptor positive ([place]) 07/27/2017  . Genetic testing 03/25/2017  . Malignant 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, estrogen receptor positive ([place]) 01/23/2017  .  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

## 2019-04-05 NOTE — Progress Notes (Addendum)
[facility]  [phone] [phone]    ID: Mackenzie [name] DOB: April 20, 1971  MR#: [id number]  DDU#:[id number]  Patient Care Team: Willey Blade, MD as PCP - General (Internal Medicine) Magrinat, [name], MD as Consulting Physician (Oncology) Erroll Luna, MD as Consulting Physician (General Surgery) Kyung Rudd, MD as Consulting Physician (Radiation Oncology) Dillingham, Loel Lofty, DO as Attending Physician (Plastic Surgery) Cathlean Cower, RN as Registered Nurse OTHER MD:    CHIEF COMPLAINT: Estrogen receptor positive breast cancer  CURRENT TREATMENT: letrozole, goserelin; on [name] trial (ribociclib arm)   INTERVAL HISTORY: Mackenzie [name] returns today for follow-up and treatment of her estrogen receptor positive breast cancer.  Mackenzie [name] is on study with [name]--Ribociclb and anti estrogen therapy with Letrozole daily.  She is here for post op assessment after holding the Ribociclib pre op since 9/15.  She underwent breast reconstruction on 9/24, she underwent right breast reduction and left breast expander placement.  Today is cycle 12 day 2 of study.  She was due to restart the Ribociclib, however, this has been held due to LFTs.  Ultrasound of the liver shows presumably fatty liver, however MRI liver was recommended to rule out metastatic disease.  She continues on letrozole.  She tolerates this well, with no significant hot flashes or vaginal dryness problems.  She also continues on goserelin every 4 weeks. The most recent dose was received on 03/07/2019.  She is due for a dose today.   Mackenzie [name]'s last bone density screening on 03/16/2018, showed a T-score of -0.7, which is considered normal.    REVIEW OF SYSTEMS: Mackenzie [name] is tolerated. She notes her pain is improving well from her recent reconstruction surgery.  She started PT last week and is doing well with this.  She is due for implant placement and expander exchange in December.   This has not yet been scheduled.   She denies any new side effects from her treatments.  She has noted some increase in bowel movements, and looser stools recently.  Yesterday she notes she went to the bathroom and had 5 episodes of loose small bowel movements.  She denies any watery diarrhea, mucous, pus, blood.  She denies any recent antibiotics.  She has noted an increase in hemorrhoids.  She has some preparation H suppositories that she is using for those.    Mackenzie [name] is otherwise doing well.  She has no fever, chills, chest pain, palpitations, cough, shortness of breath, bladder issues, constipation, nausea, vomiting, headache, or vision issues.  A detailed ROS was non contributory.  HISTORY OF CURRENT ILLNESS: From the original intake note:  The patient herself noted some changes in her left breast late July 2018, she says, and eventually brought this to medical attention so that on 01/09/2017 she underwent bilateral diagnostic mammography with tomography and left breast ultrasonography at the breast Center. This found the breast density to be category C. In the upper inner quadrant of the left breast there was an area of asymmetry and there were malignant type calcifications involving all 4 quadrants. On exam there is firmness and palpable thickening in the anterior left breast with skin dimpling. Ultrasonography found at the 9:30 o'clock radiant 3 cm from the nipple a 2.7 cm mass and in the left axilla for abnormal lymph nodes largest of which measured 2.7 cm.  Biopsy of the left breast 9:30 o'clock mass 80 01/26/2017 showed ([id number]) invasive ductal carcinoma, with extracellular mucin, grade 1 or 2. In the lower outer left breast  is separated biopsy the same day showed ductal carcinoma in situ. One of the 4 lymph nodes involved was positive for metastatic carcinoma. Prognostic panel on the invasive disease showed it to be estrogen receptor 100% positive, progesterone receptor 20% positive, both  with strong staining intensity, with an MIB-1 of 20%, and no HER-2 amplification with a signals ratio 1.38 and the number per cell 2.90.  On 01/22/2017 the patient underwent bilateral breast MRI. This showed no involvement of the right breast, but in the left breast there was a masslike and non-masslike enhancement involving all quadrants, with skin swelling but no abnormal enhancement of the skin or nipple areolar complex or pectoralis muscle. The mass could not be clearly measured but spanned approximately 12 cm. There was bulky left axillary lymphadenopathy, with the largest lymph node measuring up to 3.4 cm.  The patient's subsequent history is as detailed below.   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, left ([facility]) 01/23/2017   Hot flashes 03/2017   Hyperlipidemia 03/20/2016   Morbid obesity with body mass index (BMI) of 40.0 to 49.9 ([facility]) 09/17/2017   NCGS (non-celiac gluten sensitivity)    Pre-diabetes    Seasonal allergies 2012   seasonal allergies causes allergic rhinitis and itchy, dry eyes per pt   Vitamin D deficiency 05/2015    PAST SURGICAL HISTORY: Past Surgical History:  Procedure Laterality Date   BREAST RECONSTRUCTION WITH PLACEMENT OF TISSUE EXPANDER AND FLEX HD (ACELLULAR HYDRATED DERMIS) Left 08/27/2017   Procedure: LEFT BREAST RECONSTRUCTION WITH PLACEMENT OF TISSUE EXPANDER AND FLEX HD;  Surgeon: Wallace Going, DO;  Location: [facility];  Service: Plastics;  Laterality: Left;   CESAREAN SECTION     x2   LATISSIMUS FLAP TO BREAST Left 11/03/2018   LATISSIMUS FLAP TO BREAST Left 11/03/2018   Procedure: LATISSIMUS FLAP TO LEFT BREAST;  Surgeon: Wallace Going, DO;  Location:  [facility];  Service: Plastics;  Laterality: Left;   MASTECTOMY MODIFIED RADICAL Left 08/27/2017   MASTECTOMY MODIFIED RADICAL Left 08/27/2017   Procedure: LEFT MODIFIED RADICAL MASTECTOMY;  Surgeon: Erroll Luna, MD;  Location: [facility];  Service: General;  Laterality: Left;   MASTOPEXY Right 03/03/2019   Procedure: RIGHT BREAST MASTOPEXY/REDUCTION;  Surgeon: Wallace Going, DO;  Location: [facility];  Service: Plastics;  Laterality: Right;  3 hours, please   PORT-A-CATH REMOVAL Right 08/27/2017   Procedure: REMOVAL PORT-A-CATH RIGHT CHEST;  Surgeon: Erroll Luna, MD;  Location: [facility];  Service: General;  Laterality: Right;   PORTACATH PLACEMENT Right 01/28/2017   Procedure: INSERTION PORT-A-CATH WITH ULTRASOUND;  Surgeon: Erroll Luna, MD;  Location: [facility];  Service: General;  Laterality: Right;   REMOVAL OF TISSUE EXPANDER AND PLACEMENT OF IMPLANT Left 03/03/2019   Procedure: REMOVAL OF TISSUE EXPANDER AND PLACEMENT OF EXPANDER;  Surgeon: Wallace Going, DO;  Location: [facility];  Service: Plastics;  Laterality: Left;   TISSUE EXPANDER PLACEMENT Left 11/03/2018   Procedure: PLACEMENT OF TISSUE EXPANDER LEFT BREAST;  Surgeon: Wallace Going, DO;  Location: [facility];  Service: Plastics;  Laterality: Left;  Total case time is 3.5 hours   TUBAL LIGATION Bilateral 01/21/2011  FAMILY HISTORY Family History  Problem Relation Age of Onset   Breast cancer Paternal Grandmother 22       d.60s from breast cancer. Did not have treatment.   Other Mother        G.28 from complications of surgery to remove brain tumor   The patient's father still alive at age 71. The patient's mother died with a brain tumor which the patient says was "benign". She was 56. The patient has one brother, no sisters. The only breast cancer in the family is a paternal grandmother who died from breast cancer at an unknown age. There is no history of  ovarian or prostate cancer in the family.   GYNECOLOGIC HISTORY:  No LMP recorded. (Menstrual status: Chemotherapy). Menarche age 24 and first live birth age 30 she is Mackenzie [name] P4. The patient is still having regular periods as of August 2018   SOCIAL HISTORY: (Updated 05/31/2018) Maansi works at [employer] on the fifth floor, [place]. She is separated from her husband (as of 05/2018), [name], who is a Building control surveyor. Daughter is married and lives in [place] and works as a Education administrator. Haruka's daughter, [name], lives in [place] and works in the police department. Daughters, Huntington and Valliant are 23 and 6, living at home. The patient has a grandson she looks after. She attends a [employer].     ADVANCED DIRECTIVES: Not in place   HEALTH MAINTENANCE: Social History   Tobacco Use   Smoking status: Never Smoker   Smokeless tobacco: Never Used  Substance Use Topics   Alcohol use: Yes    Comment: social   Drug use: No    Colonoscopy: Never  PAP: December 2017  Bone density: Never   Allergies  Allergen Reactions   Dilaudid [Hydromorphone] Itching    Current Outpatient Medications  Medication Sig Dispense Refill   acetaminophen (TYLENOL) 500 MG tablet Take 1,000 mg by mouth every 6 (six) hours as needed (for pain/headaches.).      B Complex Vitamins (VITAMIN B COMPLEX PO) Take 1 capsule by mouth 2 (two) times daily.      budesonide-formoterol (SYMBICORT) 80-4.5 MCG/ACT inhaler Inhale 2 puffs into the lungs every morning.      Calcium Carb-Cholecalciferol (CALCIUM 600+D3 PO) Take 1 tablet by mouth 2 (two) times daily.      cetirizine (ZYRTEC) 10 MG tablet Take 10 mg by mouth daily.      Cholecalciferol (VITAMIN D-3) 5000 units TABS Take 5,000 Units by mouth 2 (two) times daily.      gabapentin (NEURONTIN) 300 MG capsule Take 1 capsule (300 mg total) by mouth at bedtime. (Patient taking differently: Take 300 mg by mouth every other day. ) 90 capsule 4     HYDROcodone-acetaminophen (NORCO) 5-325 MG tablet Take 1 tablet by mouth every 6 (six) hours as needed for moderate pain. 30 tablet 0   hydroxypropyl methylcellulose / hypromellose (ISOPTO TEARS / GONIOVISC) 2.5 % ophthalmic solution Place 1 drop into both eyes daily as needed for dry eyes.     ibuprofen (ADVIL,MOTRIN) 200 MG tablet Take 800 mg by mouth every 8 (eight) hours as needed for mild pain (for pain.).      Investigational ribociclib (KISQALI) 200 MG tablet [name] Take 2 tablets (400 mg total) by mouth daily. Take 2 tablets ('400mg'$  total) by mouth daily on days 1-21.  Repeat every 28 days. (Patient not taking: Reported on 03/25/2019) 225 tablet 0   letrozole (FEMARA) 2.5 MG tablet Take 1 tablet (  2.5 mg total) by mouth daily. 90 tablet 4   loratadine (CLARITIN) 10 MG tablet Take 1 tablet (10 mg total) by mouth daily. (Patient taking differently: Take 10 mg by mouth daily as needed for allergies. ) 90 tablet 2   Multiple Vitamin (MULTIVITAMIN WITH MINERALS) TABS tablet Take 1 tablet by mouth daily.      Omega-3 Fatty Acids (FISH OIL) 1000 MG CAPS Take 1,000 mg by mouth 2 (two) times daily.      phenylephrine (SUDAFED PE) 10 MG TABS tablet Take 10 mg by mouth every 4 (four) hours as needed.     Probiotic Product (PROBIOTIC DAILY PO) Take 1 capsule by mouth 2 (two) times daily.      Turmeric 450 MG CAPS Take 450 mg by mouth 2 (two) times daily.     ZINC OXIDE PO Take 1 tablet by mouth 2 (two) times a day.     No current facility-administered medications for this visit.    Facility-Administered Medications Ordered in Other Visits  Medication Dose Route Frequency Provider Last Rate Last Dose   sodium chloride flush (NS) 0.9 % injection 10 mL  10 mL Intravenous PRN Magrinat, [name], MD   10 mL at 03/10/17 1239    OBJECTIVE:  There were no vitals filed for this visit. Wt Readings from Last 3 Encounters:  03/25/19 271 lb 9.6 oz (123.2 kg)  03/15/19 269 lb 4.8 oz (122.2  kg)  03/11/19 274 lb 3.2 oz (124.4 kg)   There is no height or weight on file to calculate BMI.    ECOG FS: 1 GENERAL: Patient is a well appearing female in no acute distress HEENT:  Sclerae anicteric.  Oropharynx clear and moist. No ulcerations or evidence of oropharyngeal candidiasis. Neck is supple.  NODES:  No cervical, supraclavicular, or axillary lymphadenopathy palpated.  BREAST EXAM:  Right breast s/p reduction, healing well, left breast s/p radiation, expander in place, healing well, hyperpigmentation noted from radiation LUNGS:  Clear to auscultation bilaterally.  No wheezes or rhonchi. HEART:  Regular rate and rhythm. No murmur appreciated. ABDOMEN:  Soft, nontender.  Positive, normoactive bowel sounds. No organomegaly palpated. MSK:  No focal spinal tenderness to palpation. Full range of motion bilaterally in the upper extremities. EXTREMITIES:  No peripheral edema.   SKIN:  Clear with no obvious rashes or skin changes. No nail dyscrasia. NEURO:  Nonfocal. Well oriented.  Appropriate affect.      LAB RESULTS:  No visits with results within 3 Day(s) from this visit.  Latest known visit with results is:  Appointment on 03/29/2019  Component Date Value Ref Range Status   Sodium 03/29/2019 137  135 - 145 mmol/L Final   Potassium 03/29/2019 4.0  3.5 - 5.1 mmol/L Final   Chloride 03/29/2019 103  98 - 111 mmol/L Final   CO2 03/29/2019 24  22 - 32 mmol/L Final   Glucose, Bld 03/29/2019 132* 70 - 99 mg/dL Final   BUN 03/29/2019 17  6 - 20 mg/dL Final   Creatinine 03/29/2019 0.91  0.44 - 1.00 mg/dL Final   Calcium 03/29/2019 9.6  8.9 - 10.3 mg/dL Final   Total Protein 03/29/2019 7.8  6.5 - 8.1 g/dL Final   Albumin 03/29/2019 3.9  3.5 - 5.0 g/dL Final   AST 03/29/2019 63* 15 - 41 U/L Final   ALT 03/29/2019 122* 0 - 44 U/L Final   Alkaline Phosphatase 03/29/2019 135* 38 - 126 U/L Final   Total Bilirubin 03/29/2019 0.4  0.3 - 1.2 mg/dL Final   GFR, Est Non Af  Am 03/29/2019 >60  >60 mL/min Final   GFR, Est AFR Am 03/29/2019 >60  >60 mL/min Final   Anion gap 03/29/2019 10  5 - 15 Final   Performed at [facility], [place] [address], [place], [place] [address]   WBC Count 03/29/2019 4.6  4.0 - 10.5 K/uL Final   RBC 03/29/2019 4.57  3.87 - 5.11 MIL/uL Final   Hemoglobin 03/29/2019 12.6  12.0 - 15.0 g/dL Final   HCT 03/29/2019 39.3  36.0 - 46.0 % Final   MCV 03/29/2019 86.0  80.0 - 100.0 fL Final   MCH 03/29/2019 27.6  26.0 - 34.0 pg Final   MCHC 03/29/2019 32.1  30.0 - 36.0 g/dL Final   RDW 03/29/2019 14.2  11.5 - 15.5 % Final   Platelet Count 03/29/2019 253  150 - 400 K/uL Final   nRBC 03/29/2019 0.0  0.0 - 0.2 % Final   Neutrophils Relative % 03/29/2019 58  % Final   Neutro Abs 03/29/2019 2.7  1.7 - 7.7 K/uL Final   Lymphocytes Relative 03/29/2019 25  % Final   Lymphs Abs 03/29/2019 1.2  0.7 - 4.0 K/uL Final   Monocytes Relative 03/29/2019 13  % Final   Monocytes Absolute 03/29/2019 0.6  0.1 - 1.0 K/uL Final   Eosinophils Relative 03/29/2019 3  % Final   Eosinophils Absolute 03/29/2019 0.1  0.0 - 0.5 K/uL Final   Basophils Relative 03/29/2019 1  % Final   Basophils Absolute 03/29/2019 0.0  0.0 - 0.1 K/uL Final   Immature Granulocytes 03/29/2019 0  % Final   Abs Immature Granulocytes 03/29/2019 0.01  0.00 - 0.07 K/uL Final   Performed at [facility], [place] [address] [place], [place] [address]     STUDIES: US Abdomen Limited  Result Date: 04/04/2019 CLINICAL DATA:  Upper abdominal pain and elevated liver enzymes. History of breast carcinoma EXAM: ULTRASOUND ABDOMEN LIMITED RIGHT UPPER QUADRANT COMPARISON:  None. FINDINGS: Gallbladder: Within the gallbladder, there are echogenic foci which move and shadow consistent with cholelithiasis. Largest gallstone measures 1.0 cm in length. There is no appreciable gallbladder wall thickening or pericholecystic fluid. No sonographic  Murphy sign noted by sonographer. Common bile duct: Diameter: 6 mm. No intrahepatic or extrahepatic biliary duct dilatation. Liver: There is a mildly complex cystic area in the left lobe of the liver measuring 1.6 x 1.5 x 2.2 cm. The liver has a somewhat inhomogeneous echotexture. There is no disruption of hepatic architecture. Portal vein is patent on color Doppler imaging with normal direction of blood flow towards the liver. Other: None. IMPRESSION: 1. Cholelithiasis. No gallbladder wall thickening or pericholecystic fluid. 2. Somewhat complex cystic area in the left lobe of the liver measuring 1.6 x 1.5 x 2.2 cm. Note that the liver has a somewhat inhomogeneous echotexture which may be indicative of fatty infiltration. Infiltrative neoplasm causing this inhomogeneous appearance of the liver is a less likely but possible differential consideration given history of breast carcinoma. Given this circumstance, MR or CT of the liver pre and post-contrast to further evaluate would be reasonable advisable. These results will be called to the ordering clinician or representative by the Radiologist Assistant, and communication documented in the PACS or zVision Dashboard. Electronically Signed   By: Lowella Grip III M.D.   On: 04/04/2019 10:59     ELIGIBLE FOR AVAILABLE RESEARCH PROTOCOL: [name], ASA study   ASSESSMENT: 48  y.o. [place] woman status post left breast upper inner quadrant biopsy 01/14/2017 for a clinical T3 N2, stage IIA invasive ductal carcinoma, grade 1 or 2, estrogen and progesterone receptor positive, HER-2 nonamplified, with an MIB-1 of 20%.  (1) staging studies: Brain MRI, bone scan, and CT scan of the chest 02/05/2017 showed no brain lesions, no lung or liver lesions, a 4.9 cm mass in the left breast with left axillary and subpectoral adenopathy, and nonspecific bone scan tracer at L2, left scapula, and anterior ribs, with lumbar spine MRI suggested for further evaluation.  (a)  lumbar spine MRI 02/17/2017 showed no normal bone lesions.  There was mild lumbar spondylosis  (2) neoadjuvant chemotherapy consisting of cyclophosphamide and doxorubicin in dose dense fashion 4 started 02/10/2017, completed 03/24/2017, followed by weekly carboplatin and gemcitabine given days 1 and 8 of each 21-day cycle starting 04/14/2017, completing the planned 4 cycles 06/26/2017  (3) is post left modified radical mastectomy on 08/27/2017 showing an mpT3 pN2 residual invasive ductal carcinoma, grade 2, with a residual cancer burden of 3.  Margins were clear  (a) latissimus flap reconstruction with expander 11/03/2018  (4) postmastectomy radiation completed 01/01/2018  (a) capecitabine radiosensitization 11/16/2017-01/01/2018  (5) goserelin started 09/21/2017  (a) letrozole started 01/11/2018  (b) enrolled in [facility] clinical trial, randomized to ribociclib on 05/31/2018  (c) Bone density on 03/16/2018 was normal with T score of -0.7 in the L1-L4 spine   (6) genetics testing 03/24/2017 through the Common Hereditary Cancer Panel offered by [facility] found no deleterious mutations in APC, ATM, AXIN2, BARD1, BMPR1A, BRCA1, BRCA2, BRIP1, CDH1, CDKN2A (p14ARF), CDKN2A (p16INK4a), CHEK2, CTNNA1, DICER1, EPCAM (Deletion/duplication testing only), GREM1 (promoter region deletion/duplication testing only), KIT, MEN1, MLH1, MSH2, MSH3, MSH6, MUTYH, NBN, NF1, NHTL1, PALB2, PDGFRA, PMS2, POLD1, POLE, PTEN, RAD50, RAD51C, RAD51D, SDHB, SDHC, SDHD, SMAD4, SMARCA4. STK11, TP53, TSC1, TSC2, and VHL.  The following genes were evaluated for sequence changes only: SDHA and HOXB13 c.251G>A variant only.    (7) Right breast reduction and left breast expander placement on 03/03/2019   PLAN: Nayely is doing well today.  Her CBC is stable, CMET is pending.  She has no clinical signs of breast cancer recurrence and will continue on Letrozole daily and Goserelin every 4 weeks.    I reviewed her liver ultrasound  results.  They show fatty liver, most likely, but recommend MRI liver to rule out metastases.  I have placed this order today.  Cameo, her research nurse is calling the [name] to get direction on what to do about the ribociclib.  We do not think the ribociclib is related to the increase in LFTs.  Cameo is waiting to hear back and will guide Milaina on whether or not she can restart the Ribociclib.    I recommended dietary modification to assist with her increased bowel movements.  These are not related to her treatments.   Paije will return in 4 weeks for labs, f/u, and her next injection.  I placed referalls to nutrition and Dr. Leafy Ro today to talk about weight loss.  She was recommended to continue with the appropriate pandemic precautions. She knows to call for any other issue that may develop before the next visit.  A total of (30) minutes of face-to-face time was spent with this patient with greater than 50% of that time in counseling and care-coordination.   [name], NP  04/05/19 10:34 AM Medical Oncology and Hematology [facility] [address] [place], [place] [address]  Tel. [phone]    Fax. [phone]  Addendum: patient will undergo MRI liver after expander is removed in December.  Will adjust order accordingly.  She is ok to restart the Ribociclib per [name] trial.

## 2019-04-05 NOTE — Patient Instructions (Signed)

## 2019-04-05 NOTE — Patient Instructions (Signed)
Access Code: [email or web address]  URL: [email or web address]  Date: 04/05/2019  Prepared by: Tomma Rakers   Exercises Supine Shoulder Flexion with Dowel - 10 reps - 1 sets - 5 seconds hold - 1x daily - 7x weekly Standing Shoulder Abduction AAROM with Dowel - 10 reps - 1 sets - 5 seconds hold - 1x daily - 7x weekly Supine Chest Stretch with Elbows Bent - 3 reps - 1 sets - 20 seconds each hold - 1x daily - 7x weekly Seated Shoulder External Rotation - 10 reps - 1 sets - 1x daily - 7x weekly Standing Shoulder Abduction Slides at Wall - 10 reps - 1 sets - 5 seconds hold - 1x daily - 7x weekly

## 2019-04-06 ENCOUNTER — Telehealth: Payer: Self-pay | Admitting: *Deleted

## 2019-04-06 NOTE — Telephone Encounter (Signed)
Called patient to inform her that [name] canceled the MRI that was ordered yesterday because patient has breast expanders in place that are not compatible with MRI.  Mendel Ryder or Dr. Jana Hakim will order MRI after patients next surgery in December when the expanders are removed.  Patient verbalized understanding.  Confirmed patients next study visits for fasting labs on 04/29/19 and visits on 05/02/19 to see Mendel Ryder and Injection.  Will call patient to remind her to fast before lab appointment. Instructed patient to call if any questions or concerns before next visit. Also confirmed with patient she started taking Ribociclib again yesterday as instructed.  Foye Spurling, BSN, RN Clinical Research Nurse 04/06/2019 11:11 AM

## 2019-04-07 ENCOUNTER — Telehealth: Payer: Self-pay | Admitting: Surgical

## 2019-04-07 NOTE — Telephone Encounter (Signed)

## 2019-04-08 ENCOUNTER — Other Ambulatory Visit: Payer: Self-pay

## 2019-04-08 ENCOUNTER — Ambulatory Visit (INDEPENDENT_AMBULATORY_CARE_PROVIDER_SITE_OTHER): Payer: 59 | Admitting: Surgical

## 2019-04-08 ENCOUNTER — Encounter: Payer: Self-pay | Admitting: Surgical

## 2019-04-08 ENCOUNTER — Ambulatory Visit: Payer: 59 | Admitting: Rehabilitation

## 2019-04-08 ENCOUNTER — Encounter: Payer: Self-pay | Admitting: Rehabilitation

## 2019-04-08 VITALS — BP 151/88 | HR 78 | Temp 97.5°F | Ht 66.0 in | Wt 269.0 lb

## 2019-04-08 DIAGNOSIS — M6281 Muscle weakness (generalized): Secondary | ICD-10-CM

## 2019-04-08 DIAGNOSIS — R278 Other lack of coordination: Secondary | ICD-10-CM

## 2019-04-08 DIAGNOSIS — Z170 Estrogen receptor positive status [ER+]: Secondary | ICD-10-CM

## 2019-04-08 DIAGNOSIS — M25512 Pain in left shoulder: Secondary | ICD-10-CM | POA: Diagnosis not present

## 2019-04-08 DIAGNOSIS — M25612 Stiffness of left shoulder, not elsewhere classified: Secondary | ICD-10-CM | POA: Diagnosis not present

## 2019-04-08 DIAGNOSIS — R252 Cramp and spasm: Secondary | ICD-10-CM | POA: Diagnosis not present

## 2019-04-08 DIAGNOSIS — Z9012 Acquired absence of left breast and nipple: Secondary | ICD-10-CM

## 2019-04-08 DIAGNOSIS — C50212 Malignant neoplasm of upper-inner quadrant of left female breast: Secondary | ICD-10-CM

## 2019-04-08 DIAGNOSIS — Z9189 Other specified personal risk factors, not elsewhere classified: Secondary | ICD-10-CM | POA: Diagnosis not present

## 2019-04-08 DIAGNOSIS — C50812 Malignant neoplasm of overlapping sites of left female breast: Secondary | ICD-10-CM

## 2019-04-08 DIAGNOSIS — Z483 Aftercare following surgery for neoplasm: Secondary | ICD-10-CM

## 2019-04-08 NOTE — Therapy (Signed)
[place], [place], [address] Phone: [phone]   Fax:  [phone]  Physical Therapy Treatment  Patient Details  Name: Mackenzie [name] MRN: [id number] Date of Birth: 1970/09/15 Referring Provider (PT): Dillingham   Encounter Date: 04/08/2019  PT End of Session - 04/08/19 0815    Visit Number  4    Number of Visits  9    Date for PT Re-Evaluation  04/26/19    PT Start Time  0803    PT Stop Time  [id number]    PT Time Calculation (min)  44 min    Activity Tolerance  Patient tolerated treatment well    Behavior During Therapy  [facility] for tasks assessed/performed       Past Medical History:  Diagnosis Date  . Abnormal glucose 2018  . 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 Allergic rhinitis 2012  . Anemia 01/27/2017   prior to starting chemotherapy  . Breast cancer ([place]) 01/14/2017   Left breast  . 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 Hot flashes 03/2017  . Hyperlipidemia 03/20/2016  . Morbid obesity with body mass index (BMI) of 40.0 to 49.9 ([place]) 09/17/2017  . NCGS (non-celiac gluten sensitivity)   . Pre-diabetes   . Seasonal allergies 2012   seasonal allergies causes allergic rhinitis and itchy, dry eyes per pt  . Vitamin D deficiency 05/2015    Past Surgical History:  Procedure Laterality Date  . BREAST RECONSTRUCTION WITH PLACEMENT OF TISSUE EXPANDER AND FLEX HD (ACELLULAR HYDRATED DERMIS) Left 08/27/2017   Procedure: LEFT BREAST RECONSTRUCTION WITH PLACEMENT OF TISSUE EXPANDER AND FLEX HD;  Surgeon: Wallace Going, DO;  Location: [place];  Service: Plastics;  Laterality: Left;  . CESAREAN SECTION     x2  . LATISSIMUS FLAP TO BREAST Left 11/03/2018  . LATISSIMUS FLAP TO BREAST Left 11/03/2018   Procedure: LATISSIMUS FLAP TO LEFT BREAST;  Surgeon: Wallace Going, DO;  Location: [place];  Service: Plastics;  Laterality: Left;  Marland Kitchen MASTECTOMY  MODIFIED RADICAL Left 08/27/2017  . MASTECTOMY MODIFIED RADICAL Left 08/27/2017   Procedure: LEFT MODIFIED RADICAL MASTECTOMY;  Surgeon: Erroll Luna, MD;  Location: [place];  Service: General;  Laterality: Left;  Marland Kitchen MASTOPEXY Right 03/03/2019   Procedure: RIGHT BREAST MASTOPEXY/REDUCTION;  Surgeon: Wallace Going, DO;  Location: [place];  Service: Plastics;  Laterality: Right;  3 hours, please  . PORT-A-CATH REMOVAL Right 08/27/2017   Procedure: REMOVAL PORT-A-CATH RIGHT CHEST;  Surgeon: Erroll Luna, MD;  Location: [place];  Service: General;  Laterality: Right;  . PORTACATH PLACEMENT Right 01/28/2017   Procedure: INSERTION PORT-A-CATH WITH ULTRASOUND;  Surgeon: Erroll Luna, MD;  Location: [place];  Service: General;  Laterality: Right;  . REMOVAL OF TISSUE EXPANDER AND PLACEMENT OF IMPLANT Left 03/03/2019   Procedure: REMOVAL OF TISSUE EXPANDER AND PLACEMENT OF EXPANDER;  Surgeon: Wallace Going, DO;  Location: [place];  Service: Plastics;  Laterality: Left;  . TISSUE EXPANDER PLACEMENT Left 11/03/2018   Procedure: PLACEMENT OF TISSUE EXPANDER LEFT BREAST;  Surgeon: Wallace Going, DO;  Location: [place];  Service: Plastics;  Laterality: Left;  Total case time is 3.5 hours  . TUBAL LIGATION Bilateral 01/21/2011    There were no vitals filed for this visit.  Subjective Assessment - 04/08/19 0808    Subjective  I can tell it isn't so tight.  Still on  light duty    Pertinent History  Left breast cancer diagnosed 01/10/17 by mammogram and needle biopsy. Then had neo-adjuvant chemo starting 02/10/17 until 06/26/17; had left mastectomy 08/27/17 with ALND and immediate expander placed. Radiation completed. Expander removed and replaced on May 27, 19 b/c it did not get expanded enough prior to radiation, also had lat flap 11/02/17. Had expander removed and replaced again on 03/03/19 due to a small leak, pt had reduction on R breast at this  time    Currently in Pain?  No/denies         [facility] PT Assessment - 04/08/19 0001      AROM   Left Shoulder Flexion  135 Degrees   pull   Left Shoulder ABduction  125 Degrees   pull in axilla   Left Shoulder Internal Rotation  75 Degrees                   [facility] Adult PT Treatment/Exercise - 04/08/19 0001      Shoulder Exercises: Supine   Flexion  AAROM;Both;10 reps    Other Supine Exercises  Supine chest press 10x VC for movement discussed avoiding pain.     Other Supine Exercises  Supine ER stretch with hands behind head 2x 30 seconds w/VC to press elbows toward the table pain-free.       Shoulder Exercises: Seated   Other Seated Exercises  --      Shoulder Exercises: Pulleys   Flexion  2 minutes    Flexion Limitations  with cues for instruction    ABduction  2 minutes    ABduction Limitations  with cueing for instruction      Shoulder Exercises: Therapy Ball   Flexion  Both;5 reps    ABduction  Left;5 reps      Manual Therapy   Joint Mobilization  inferior and PA glides grade IV- 2x20" each    Passive ROM  In supine L shoulder abduction, flexion, ER and IR with end range stretch and oscillations.                   PT Long Term Goals - 03/29/19 1554      PT LONG TERM GOAL #1   Title  Pt will demonstrate 160 degrees of left shoulder flexion to allow her to reach overhead.    Baseline  137    Time  4    Period  Weeks    Status  New    Target Date  04/26/19      PT LONG TERM GOAL #2   Title  Pt will demonstrate 160 degrees of left shoulder abduction to allow her to reach out to the side    Baseline  135    Time  4    Period  Weeks    Status  New    Target Date  04/26/19      PT LONG TERM GOAL #3   Title  Pt will report an 80% improvement in pain and tightness in L axilla to allow pt to move LUE without discomfort.    Time  4    Period  Weeks    Status  New    Target Date  04/26/19      PT LONG TERM GOAL #4   Title  Pt will be  independent with a home exercise program for continued strengthening and stretching    Time  4    Period  Weeks    Status  New    Target Date  04/26/19      PT LONG TERM GOAL #5   Title  Pt will report a 50% improvement in back pain in area of lat flap when walking to allow improved comfort    Time  4    Period  Weeks    Status  New    Target Date  04/26/19            Plan - 04/08/19 0847    Clinical Impression Statement  ROM measures into flexion and abduction were similar or a bit less today but improved into ER actively.  No limitations or pain with any activitiy.    PT Frequency  2x / week    PT Duration  4 weeks    PT Treatment/Interventions  ADLs/Self Care Home Management;Therapeutic exercise;Patient/family education;Manual techniques;Manual lymph drainage;Compression bandaging;Scar mobilization;Passive range of motion;Therapeutic activities    PT Next Visit Plan  get new appt for sleeve? continue PROM to L shoulder,  AAROM exercises, teach Strength ABC before DC    PT Home Exercise Plan  Access Code: [email or web address]       Patient will benefit from skilled therapeutic intervention in order to improve the following deficits and impairments:     Visit Diagnosis: Stiffness of left shoulder, not elsewhere classified  Aftercare following surgery for neoplasm  Acute pain of left shoulder  Muscle weakness (generalized)  Other lack of coordination  Cramp and spasm  At risk for lymphedema     Problem List Patient Active Problem List   Diagnosis Date Noted  . Benign essential HTN 08/17/2018  . Morbid obesity with body mass index (BMI) of 40.0 to 49.9 ([place]) 09/17/2017  . 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 Malignant neoplasm of upper-inner quadrant of left breast in female, estrogen receptor positive ([place]) 07/27/2017  . Genetic testing 03/25/2017  . Malignant 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, estrogen receptor positive ([place]) 01/23/2017  .  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

## 2019-04-08 NOTE — Progress Notes (Signed)
   Subjective:     Patient ID: Mackenzie [name], female    DOB: [birth year], 48 y.o.   MRN: [id number]  Chief Complaint  Patient presents with  . Follow-up    2 weeks    HPI: The patient is a 48 y.o. female here for follow-up on her breast reconstruction.  She underwent removal of left tissue expander for new expander, left capsulotomy, right breast reduction/mastopexy for symmetry. She is healing really well. No complaints.  At her last visit, she had a small skin tear after removal of steri-strips, this is healing well. No sign of infection, seroma, hematoma at this time. Right breast incisions are slightly irritated, but there is no sign of infection.  She is pleased with her progress. Working with PT has helped with ROM of L arm.  Denies fever, chills, n/v  Review of Systems  Constitutional: Negative for activity change, appetite change, chills, diaphoresis, fatigue and fever.  Respiratory: Negative.   Cardiovascular: Negative.   Gastrointestinal: Negative for abdominal pain, nausea and vomiting.  Musculoskeletal: Positive for myalgias (left arm/pec).  Neurological: Negative for dizziness and weakness.     Objective:   Vital Signs BP (!) 151/88 (BP Location: Right Arm, Patient Position: Sitting)   Pulse 78   Temp (!) 97.5 F (36.4 C) (Temporal)   Ht 5\' 6"  (1.676 m)   Wt 269 lb (122 kg)   SpO2 98%   BMI 43.42 kg/m  Vital Signs and Nursing Note Reviewed Chaperone present Physical Exam  Constitutional: She is oriented to person, place, and time and well-developed, well-nourished, and in no distress.  HENT:  Head: Normocephalic and atraumatic.  Cardiovascular: Normal rate.  Pulmonary/Chest: Effort normal.    Musculoskeletal: Normal range of motion.  Neurological: She is alert and oriented to person, place, and time. Gait normal.  Skin: Skin is warm and dry. No rash noted. She is not diaphoretic. No erythema. No pallor.  Psychiatric: Mood and affect normal.      Assessment/Plan:     ICD-10-CM   1. Malignant 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, estrogen receptor positive ([place])  C50.812    Z17.0   2. Malignant neoplasm of upper-inner quadrant of left breast in female, estrogen receptor positive ([place])  C50.212    Z17.0   3. Acquired absence of left breast  Z90.12     Mrs. Tovar is doing well. Incisions c/d/i. She tolerated last fill fine. She had some tightness, but reports PT has helped.  Mastopexy/reduction incisions c/d/i.  Vaseline to right breast abrasion/tear.  We placed injectable saline in the Expander using a sterile technique: Left:50cc for a total of 600/ 650cc  Follow up in 2 weeks for additional fill. Continue working with PT.  Exchange scheduled for 05/25/19.  Carola Rhine Takoda Janowiak, PA-C 04/08/2019, 2:07 PM

## 2019-04-12 ENCOUNTER — Ambulatory Visit: Payer: 59 | Admitting: Physical Therapy

## 2019-04-12 ENCOUNTER — Encounter: Payer: Self-pay | Admitting: Physical Therapy

## 2019-04-12 ENCOUNTER — Other Ambulatory Visit: Payer: Self-pay

## 2019-04-12 DIAGNOSIS — M6281 Muscle weakness (generalized): Secondary | ICD-10-CM

## 2019-04-12 DIAGNOSIS — M25612 Stiffness of left shoulder, not elsewhere classified: Secondary | ICD-10-CM | POA: Insufficient documentation

## 2019-04-12 DIAGNOSIS — Z170 Estrogen receptor positive status [ER+]: Secondary | ICD-10-CM | POA: Diagnosis not present

## 2019-04-12 DIAGNOSIS — Z5111 Encounter for antineoplastic chemotherapy: Secondary | ICD-10-CM | POA: Diagnosis not present

## 2019-04-12 DIAGNOSIS — M25512 Pain in left shoulder: Secondary | ICD-10-CM | POA: Insufficient documentation

## 2019-04-12 DIAGNOSIS — Z006 Encounter for examination for normal comparison and control in clinical research program: Secondary | ICD-10-CM | POA: Diagnosis not present

## 2019-04-12 DIAGNOSIS — Z483 Aftercare following surgery for neoplasm: Secondary | ICD-10-CM | POA: Insufficient documentation

## 2019-04-12 DIAGNOSIS — Z79899 Other long term (current) drug therapy: Secondary | ICD-10-CM | POA: Diagnosis not present

## 2019-04-12 DIAGNOSIS — C50812 Malignant neoplasm of overlapping sites of left female breast: Secondary | ICD-10-CM | POA: Diagnosis not present

## 2019-04-12 DIAGNOSIS — R252 Cramp and spasm: Secondary | ICD-10-CM | POA: Insufficient documentation

## 2019-04-12 NOTE — Patient Instructions (Signed)
Over Head Pull: Narrow and Wide Grip   Cancer Rehab [phone]   On back, knees bent, feet flat, band across thighs, elbows straight but relaxed. Pull hands apart (start). Keeping elbows straight, bring arms up and over head, hands toward floor. Keep pull steady on band. Hold momentarily. Return slowly, keeping pull steady, back to start. Then do same with a wider grip on the band (past shoulder width) Repeat _10__ times. Band color __yellow____   Side Pull: Double Arm   On back, knees bent, feet flat. Arms perpendicular to body, shoulder level, elbows straight but relaxed. Pull arms out to sides, elbows straight. Resistance band comes across collarbones, hands toward floor. Hold momentarily. Slowly return to starting position. Repeat _10__ times. Band color _yellow____   Sword   On back, knees bent, feet flat, left hand on left hip, right hand above left. Pull right arm DIAGONALLY (hip to shoulder) across chest. Bring right arm along head toward floor. Hold momentarily. Slowly return to starting position. Repeat _10__ times. Do with left arm. Band color _yellow_____   Shoulder Rotation: Double Arm   On back, knees bent, feet flat, elbows tucked at sides, bent 90, hands palms up. Pull hands apart and down toward floor, keeping elbows near sides. Hold momentarily. Slowly return to starting position. Repeat _10__ times. Band color __yellow____    

## 2019-04-12 NOTE — Therapy (Signed)
[place], [place], [address] Phone: [phone]   Fax:  [phone]  Physical Therapy Treatment  Patient Details  Name: Mackenzie [name] MRN: [id number] Date of Birth: Oct 30, 1970 Referring Provider (PT): Dillingham   Encounter Date: 04/12/2019  PT End of Session - 04/12/19 0950    Visit Number  5    Number of Visits  9    Date for PT Re-Evaluation  04/26/19    PT Start Time  0914    PT Stop Time  0957    PT Time Calculation (min)  43 min    Activity Tolerance  Patient tolerated treatment well    Behavior During Therapy  [facility] for tasks assessed/performed       Past Medical History:  Diagnosis Date  . Abnormal glucose 2018  . 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 Allergic rhinitis 2012  . Anemia 01/27/2017   prior to starting chemotherapy  . Breast cancer ([place]) 01/14/2017   Left breast  . 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 Hot flashes 03/2017  . Hyperlipidemia 03/20/2016  . Morbid obesity with body mass index (BMI) of 40.0 to 49.9 ([place]) 09/17/2017  . NCGS (non-celiac gluten sensitivity)   . Pre-diabetes   . Seasonal allergies 2012   seasonal allergies causes allergic rhinitis and itchy, dry eyes per pt  . Vitamin D deficiency 05/2015    Past Surgical History:  Procedure Laterality Date  . BREAST RECONSTRUCTION WITH PLACEMENT OF TISSUE EXPANDER AND FLEX HD (ACELLULAR HYDRATED DERMIS) Left 08/27/2017   Procedure: LEFT BREAST RECONSTRUCTION WITH PLACEMENT OF TISSUE EXPANDER AND FLEX HD;  Surgeon: Wallace Going, DO;  Location: [place];  Service: Plastics;  Laterality: Left;  . CESAREAN SECTION     x2  . LATISSIMUS FLAP TO BREAST Left 11/03/2018  . LATISSIMUS FLAP TO BREAST Left 11/03/2018   Procedure: LATISSIMUS FLAP TO LEFT BREAST;  Surgeon: Wallace Going, DO;  Location: [place];  Service: Plastics;  Laterality: Left;  Marland Kitchen MASTECTOMY  MODIFIED RADICAL Left 08/27/2017  . MASTECTOMY MODIFIED RADICAL Left 08/27/2017   Procedure: LEFT MODIFIED RADICAL MASTECTOMY;  Surgeon: Erroll Luna, MD;  Location: [place];  Service: General;  Laterality: Left;  Marland Kitchen MASTOPEXY Right 03/03/2019   Procedure: RIGHT BREAST MASTOPEXY/REDUCTION;  Surgeon: Wallace Going, DO;  Location: [place];  Service: Plastics;  Laterality: Right;  3 hours, please  . PORT-A-CATH REMOVAL Right 08/27/2017   Procedure: REMOVAL PORT-A-CATH RIGHT CHEST;  Surgeon: Erroll Luna, MD;  Location: [place];  Service: General;  Laterality: Right;  . PORTACATH PLACEMENT Right 01/28/2017   Procedure: INSERTION PORT-A-CATH WITH ULTRASOUND;  Surgeon: Erroll Luna, MD;  Location: [facility];  Service: General;  Laterality: Right;  . REMOVAL OF TISSUE EXPANDER AND PLACEMENT OF IMPLANT Left 03/03/2019   Procedure: REMOVAL OF TISSUE EXPANDER AND PLACEMENT OF EXPANDER;  Surgeon: Wallace Going, DO;  Location: [place];  Service: Plastics;  Laterality: Left;  . TISSUE EXPANDER PLACEMENT Left 11/03/2018   Procedure: PLACEMENT OF TISSUE EXPANDER LEFT BREAST;  Surgeon: Wallace Going, DO;  Location: [place];  Service: Plastics;  Laterality: Left;  Total case time is 3.5 hours  . TUBAL LIGATION Bilateral 01/21/2011    There were no vitals filed for this visit.  Subjective Assessment - 04/12/19 0915    Subjective  I had a fill on Friday and I have been  very tight in my breast area.    Pertinent History  Left breast cancer diagnosed 01/10/17 by mammogram and needle biopsy. Then had neo-adjuvant chemo starting 02/10/17 until 06/26/17; had left mastectomy 08/27/17 with ALND and immediate expander placed. Radiation completed. Expander removed and replaced on May 27, 19 b/c it did not get expanded enough prior to radiation, also had lat flap 11/02/17. Had expander removed and replaced again on 03/03/19 due to a small leak, pt had reduction on  R breast at this time    Patient Stated Goals  to get strength back and ROM, be able to unhook bra easily    Currently in Pain?  Yes    Pain Score  3     Pain Location  Breast    Pain Orientation  Left    Pain Descriptors / Indicators  Tightness    Pain Type  Acute pain    Pain Onset  In the past 7 days                       [facility] Adult PT Treatment/Exercise - 04/12/19 0001      Shoulder Exercises: Supine   Horizontal ABduction  Strengthening;Both;10 reps;Theraband   pt returned therapist demo   Theraband Level (Shoulder Horizontal ABduction)  Level 1 (Yellow)    External Rotation  Strengthening;Both;10 reps;Theraband   pt returned therapist demo   Theraband Level (Shoulder External Rotation)  Level 1 (Yellow)    Flexion  Strengthening;Both;10 reps;Theraband   narrow and wide grip, pt returned therapist demo   Theraband Level (Shoulder Flexion)  Level 1 (Yellow)    Diagonals  Strengthening;Both;10 reps;Theraband   pt returned therapist demo   Theraband Level (Shoulder Diagonals)  Level 1 (Yellow)      Shoulder Exercises: Pulleys   Flexion  2 minutes    ABduction  2 minutes      Shoulder Exercises: Therapy Ball   Flexion  Both;10 reps   with stretch at top   ABduction  Left;5 reps   with stretch at end range     Manual Therapy   Joint Mobilization  gentle inferior and PA glides to L shoulder    Passive ROM  In supine L shoulder abduction, flexion, ER and IR with end range stretch and oscillations.                   PT Long Term Goals - 03/29/19 1554      PT LONG TERM GOAL #1   Title  Pt will demonstrate 160 degrees of left shoulder flexion to allow her to reach overhead.    Baseline  137    Time  4    Period  Weeks    Status  New    Target Date  04/26/19      PT LONG TERM GOAL #2   Title  Pt will demonstrate 160 degrees of left shoulder abduction to allow her to reach out to the side    Baseline  135    Time  4    Period  Weeks     Status  New    Target Date  04/26/19      PT LONG TERM GOAL #3   Title  Pt will report an 80% improvement in pain and tightness in L axilla to allow pt to move LUE without discomfort.    Time  4    Period  Weeks    Status  New  Target Date  04/26/19      PT LONG TERM GOAL #4   Title  Pt will be independent with a home exercise program for continued strengthening and stretching    Time  4    Period  Weeks    Status  New    Target Date  04/26/19      PT LONG TERM GOAL #5   Title  Pt will report a 50% improvement in back pain in area of lat flap when walking to allow improved comfort    Time  4    Period  Weeks    Status  New    Target Date  04/26/19            Plan - 04/12/19 0950    Clinical Impression Statement  Continued with AAROM exercises today and educated pt on how to obtain pulleys to use at home. Continued with PROM today. Pt was not limited by pain with this and was able to tolerate prolonged stretching. Educated pt in supine scapular series with yellow band and issued band to pt to begin these at home.    Rehab Potential  Good    Clinical Impairments Affecting Rehab Potential  hx of radiation    PT Frequency  2x / week    PT Duration  4 weeks    PT Treatment/Interventions  ADLs/Self Care Home Management;Therapeutic exercise;Patient/family education;Manual techniques;Manual lymph drainage;Compression bandaging;Scar mobilization;Passive range of motion;Therapeutic activities    PT Next Visit Plan  see if pt was measured for a sleeve, continue PROM to L shoulder,  AAROM exercises, teach Strength ABC before DC    PT Home Exercise Plan  Access Code: [email or web address]    Consulted and Agree with Plan of Care  Patient       Patient will benefit from skilled therapeutic intervention in order to improve the following deficits and impairments:  Decreased range of motion, Impaired UE functional use, Decreased strength, Other (comment), Pain, Postural dysfunction  Visit  Diagnosis: Stiffness of left shoulder, not elsewhere classified  Aftercare following surgery for neoplasm  Acute pain of left shoulder  Muscle weakness (generalized)     Problem List Patient Active Problem List   Diagnosis Date Noted  . Benign essential HTN 08/17/2018  . Morbid obesity with body mass index (BMI) of 40.0 to 49.9 ([place]) 09/17/2017  . 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 Malignant neoplasm of upper-inner quadrant of left breast in female, estrogen receptor positive ([place]) 07/27/2017  . Genetic testing 03/25/2017  . Malignant 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, estrogen receptor positive ([place]) 01/23/2017  . 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 04/12/2019, 9:59 AM  [place], [place], [address] Phone: [phone]   Fax:  [phone]  Name: Mackenzie [name] MRN: [id number] Date of Birth: Jan 23, 1971  Manus Gunning, PT 04/12/19 9:59 AM

## 2019-04-13 ENCOUNTER — Other Ambulatory Visit: Payer: Self-pay | Admitting: *Deleted

## 2019-04-14 ENCOUNTER — Ambulatory Visit: Payer: 59 | Admitting: Physical Therapy

## 2019-04-14 ENCOUNTER — Other Ambulatory Visit: Payer: Self-pay

## 2019-04-14 ENCOUNTER — Encounter: Payer: Self-pay | Admitting: Physical Therapy

## 2019-04-14 DIAGNOSIS — Z483 Aftercare following surgery for neoplasm: Secondary | ICD-10-CM

## 2019-04-14 DIAGNOSIS — Z006 Encounter for examination for normal comparison and control in clinical research program: Secondary | ICD-10-CM | POA: Diagnosis not present

## 2019-04-14 DIAGNOSIS — Z79899 Other long term (current) drug therapy: Secondary | ICD-10-CM | POA: Diagnosis not present

## 2019-04-14 DIAGNOSIS — M25512 Pain in left shoulder: Secondary | ICD-10-CM

## 2019-04-14 DIAGNOSIS — R252 Cramp and spasm: Secondary | ICD-10-CM

## 2019-04-14 DIAGNOSIS — C50812 Malignant neoplasm of overlapping sites of left female breast: Secondary | ICD-10-CM | POA: Diagnosis not present

## 2019-04-14 DIAGNOSIS — M25612 Stiffness of left shoulder, not elsewhere classified: Secondary | ICD-10-CM

## 2019-04-14 DIAGNOSIS — Z170 Estrogen receptor positive status [ER+]: Secondary | ICD-10-CM | POA: Diagnosis not present

## 2019-04-14 DIAGNOSIS — Z5111 Encounter for antineoplastic chemotherapy: Secondary | ICD-10-CM | POA: Diagnosis not present

## 2019-04-14 NOTE — Therapy (Signed)
[name], [place], [address] Phone: [phone]   Fax:  [phone]  Physical Therapy Treatment  Patient Details  Name: Mackenzie [name] MRN: [id number] Date of Birth: 09-Feb-1971 Referring Provider (PT): Dillingham   Encounter Date: 04/14/2019  PT End of Session - 04/14/19 [id number]    Visit Number  6    Number of Visits  9    Date for PT Re-Evaluation  04/26/19    PT Start Time  [id number]    PT Stop Time  1648    PT Time Calculation (min)  41 min    Activity Tolerance  Patient tolerated treatment well    Behavior During Therapy  [facility] for tasks assessed/performed       Past Medical History:  Diagnosis Date  . Abnormal glucose 2018  . 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 Allergic rhinitis 2012  . Anemia 01/27/2017   prior to starting chemotherapy  . Breast cancer ([place]) 01/14/2017   Left breast  . 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 Hot flashes 03/2017  . Hyperlipidemia 03/20/2016  . Morbid obesity with body mass index (BMI) of 40.0 to 49.9 ([place]) 09/17/2017  . NCGS (non-celiac gluten sensitivity)   . Pre-diabetes   . Seasonal allergies 2012   seasonal allergies causes allergic rhinitis and itchy, dry eyes per pt  . Vitamin D deficiency 05/2015    Past Surgical History:  Procedure Laterality Date  . BREAST RECONSTRUCTION WITH PLACEMENT OF TISSUE EXPANDER AND FLEX HD (ACELLULAR HYDRATED DERMIS) Left 08/27/2017   Procedure: LEFT BREAST RECONSTRUCTION WITH PLACEMENT OF TISSUE EXPANDER AND FLEX HD;  Surgeon: Wallace Going, DO;  Location: [place];  Service: Plastics;  Laterality: Left;  . CESAREAN SECTION     x2  . LATISSIMUS FLAP TO BREAST Left 11/03/2018  . LATISSIMUS FLAP TO BREAST Left 11/03/2018   Procedure: LATISSIMUS FLAP TO LEFT BREAST;  Surgeon: Wallace Going, DO;  Location: [place];  Service: Plastics;  Laterality: Left;  Marland Kitchen MASTECTOMY  MODIFIED RADICAL Left 08/27/2017  . MASTECTOMY MODIFIED RADICAL Left 08/27/2017   Procedure: LEFT MODIFIED RADICAL MASTECTOMY;  Surgeon: Erroll Luna, MD;  Location: [place];  Service: General;  Laterality: Left;  Marland Kitchen MASTOPEXY Right 03/03/2019   Procedure: RIGHT BREAST MASTOPEXY/REDUCTION;  Surgeon: Wallace Going, DO;  Location: [place];  Service: Plastics;  Laterality: Right;  3 hours, please  . PORT-A-CATH REMOVAL Right 08/27/2017   Procedure: REMOVAL PORT-A-CATH RIGHT CHEST;  Surgeon: Erroll Luna, MD;  Location: [place];  Service: General;  Laterality: Right;  . PORTACATH PLACEMENT Right 01/28/2017   Procedure: INSERTION PORT-A-CATH WITH ULTRASOUND;  Surgeon: Erroll Luna, MD;  Location: [place];  Service: General;  Laterality: Right;  . REMOVAL OF TISSUE EXPANDER AND PLACEMENT OF IMPLANT Left 03/03/2019   Procedure: REMOVAL OF TISSUE EXPANDER AND PLACEMENT OF EXPANDER;  Surgeon: Wallace Going, DO;  Location: [place];  Service: Plastics;  Laterality: Left;  . TISSUE EXPANDER PLACEMENT Left 11/03/2018   Procedure: PLACEMENT OF TISSUE EXPANDER LEFT BREAST;  Surgeon: Wallace Going, DO;  Location: [place];  Service: Plastics;  Laterality: Left;  Total case time is 3.5 hours  . TUBAL LIGATION Bilateral 01/21/2011    There were no vitals filed for this visit.  Subjective Assessment - 04/14/19 1622    Subjective  I have been having pain in my left breast. It  is like a shooting pain.    Pertinent History  Left breast cancer diagnosed 01/10/17 by mammogram and needle biopsy. Then had neo-adjuvant chemo starting 02/10/17 until 06/26/17; had left mastectomy 08/27/17 with ALND and immediate expander placed. Radiation completed. Expander removed and replaced on May 27, 19 b/c it did not get expanded enough prior to radiation, also had lat flap 11/02/17. Had expander removed and replaced again on 03/03/19 due to a small leak, pt had reduction  on R breast at this time    Patient Stated Goals  to get strength back and ROM, be able to unhook bra easily    Currently in Pain?  Yes    Pain Score  5     Pain Location  Breast    Pain Orientation  Left    Pain Descriptors / Indicators  Shooting    Pain Type  Acute pain    Pain Onset  In the past 7 days                       [facility] Adult PT Treatment/Exercise - 04/14/19 0001      Shoulder Exercises: Pulleys   Flexion  2 minutes    ABduction  2 minutes      Shoulder Exercises: Therapy Ball   Flexion  Both;10 reps   with stretch at top   ABduction  Left;10 reps   with stretch at end range     Manual Therapy   Soft tissue mobilization  to left axilla to help decrease tightness, to left inferior breast in pt's area of pain and discomfort with softening noted    Passive ROM  in supine to L shoulder in to flexion and abduction                  PT Long Term Goals - 03/29/19 1554      PT LONG TERM GOAL #1   Title  Pt will demonstrate 160 degrees of left shoulder flexion to allow her to reach overhead.    Baseline  137    Time  4    Period  Weeks    Status  New    Target Date  04/26/19      PT LONG TERM GOAL #2   Title  Pt will demonstrate 160 degrees of left shoulder abduction to allow her to reach out to the side    Baseline  135    Time  4    Period  Weeks    Status  New    Target Date  04/26/19      PT LONG TERM GOAL #3   Title  Pt will report an 80% improvement in pain and tightness in L axilla to allow pt to move LUE without discomfort.    Time  4    Period  Weeks    Status  New    Target Date  04/26/19      PT LONG TERM GOAL #4   Title  Pt will be independent with a home exercise program for continued strengthening and stretching    Time  4    Period  Weeks    Status  New    Target Date  04/26/19      PT LONG TERM GOAL #5   Title  Pt will report a 50% improvement in back pain in area of lat flap when walking to allow improved  comfort    Time  4  Period  Weeks    Status  New    Target Date  04/26/19            Plan - 04/14/19 1649    Clinical Impression Statement  Pt having pain and tightness in inferior left breast and left axilla. Notable tightness noted in these areas. Performed soft tissue mobilization to these areas and pt felt much better at end of session. Notable decrease in tightness was palpated. Continued with PROM and AAROM for end range L shoulder ROM.    PT Frequency  2x / week    PT Duration  4 weeks    PT Treatment/Interventions  ADLs/Self Care Home Management;Therapeutic exercise;Patient/family education;Manual techniques;Manual lymph drainage;Compression bandaging;Scar mobilization;Passive range of motion;Therapeutic activities    PT Next Visit Plan  see if pt was measured for a sleeve, continue PROM to L shoulder,  AAROM exercises, teach Strength ABC before DC    PT Home Exercise Plan  Access Code: [email or web address]    Consulted and Agree with Plan of Care  Patient       Patient will benefit from skilled therapeutic intervention in order to improve the following deficits and impairments:  Decreased range of motion, Impaired UE functional use, Decreased strength, Other (comment), Pain, Postural dysfunction  Visit Diagnosis: Stiffness of left shoulder, not elsewhere classified  Aftercare following surgery for neoplasm  Acute pain of left shoulder  Cramp and spasm     Problem List Patient Active Problem List   Diagnosis Date Noted  . Benign essential HTN 08/17/2018  . Morbid obesity with body mass index (BMI) of 40.0 to 49.9 ([place]) 09/17/2017  . 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 Malignant neoplasm of upper-inner quadrant of left breast in female, estrogen receptor positive ([place]) 07/27/2017  . Genetic testing 03/25/2017  . Malignant 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, estrogen receptor positive ([place]) 01/23/2017  . Carcinoma of breast metastatic to  axillary lymph node, left ([place]) 01/23/2017    Allyson Sabal [facility] 04/14/2019, 4:51 PM  [place], [place], [address] Phone: [phone]   Fax:  [phone]  Name: TRUDA FOLAND MRN: [id number] Date of Birth: [birth year]  Manus Gunning, PT 04/14/19 4:51 PM

## 2019-04-19 ENCOUNTER — Other Ambulatory Visit: Payer: Self-pay

## 2019-04-19 ENCOUNTER — Encounter: Payer: Self-pay | Admitting: Physical Therapy

## 2019-04-19 ENCOUNTER — Ambulatory Visit: Payer: 59 | Admitting: Physical Therapy

## 2019-04-19 DIAGNOSIS — M25512 Pain in left shoulder: Secondary | ICD-10-CM

## 2019-04-19 DIAGNOSIS — Z170 Estrogen receptor positive status [ER+]: Secondary | ICD-10-CM | POA: Diagnosis not present

## 2019-04-19 DIAGNOSIS — Z79899 Other long term (current) drug therapy: Secondary | ICD-10-CM | POA: Diagnosis not present

## 2019-04-19 DIAGNOSIS — Z483 Aftercare following surgery for neoplasm: Secondary | ICD-10-CM

## 2019-04-19 DIAGNOSIS — Z006 Encounter for examination for normal comparison and control in clinical research program: Secondary | ICD-10-CM | POA: Diagnosis not present

## 2019-04-19 DIAGNOSIS — M25612 Stiffness of left shoulder, not elsewhere classified: Secondary | ICD-10-CM

## 2019-04-19 DIAGNOSIS — Z5111 Encounter for antineoplastic chemotherapy: Secondary | ICD-10-CM | POA: Diagnosis not present

## 2019-04-19 DIAGNOSIS — C50812 Malignant neoplasm of overlapping sites of left female breast: Secondary | ICD-10-CM | POA: Diagnosis not present

## 2019-04-19 NOTE — Therapy (Signed)
[place], [place], [address] Phone: [phone]   Fax:  [phone]  Physical Therapy Treatment  Patient Details  Name: Mackenzie [name] MRN: [id number] Date of Birth: [birth year] Referring Provider (PT): Dillingham   Encounter Date: 04/19/2019  PT End of Session - 04/19/19 1356    Visit Number  7    Number of Visits  9    Date for PT Re-Evaluation  04/26/19    PT Start Time  1318   pt arrive late   PT Stop Time  1350    PT Time Calculation (min)  32 min    Activity Tolerance  Patient tolerated treatment well    Behavior During Therapy  [facility] for tasks assessed/performed       Past Medical History:  Diagnosis Date  . Abnormal glucose 2018  . 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 Allergic rhinitis 2012  . Anemia 01/27/2017   prior to starting chemotherapy  . Breast cancer ([employer]) 01/14/2017   Left breast  . 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 Hot flashes 03/2017  . Hyperlipidemia 03/20/2016  . Morbid obesity with body mass index (BMI) of 40.0 to 49.9 ([place]) 09/17/2017  . NCGS (non-celiac gluten sensitivity)   . Pre-diabetes   . Seasonal allergies 2012   seasonal allergies causes allergic rhinitis and itchy, dry eyes per pt  . Vitamin D deficiency 05/2015    Past Surgical History:  Procedure Laterality Date  . BREAST RECONSTRUCTION WITH PLACEMENT OF TISSUE EXPANDER AND FLEX HD (ACELLULAR HYDRATED DERMIS) Left 08/27/2017   Procedure: LEFT BREAST RECONSTRUCTION WITH PLACEMENT OF TISSUE EXPANDER AND FLEX HD;  Surgeon: Wallace Going, DO;  Location: [name];  Service: Plastics;  Laterality: Left;  . CESAREAN SECTION     x2  . LATISSIMUS FLAP TO BREAST Left 11/03/2018  . LATISSIMUS FLAP TO BREAST Left 11/03/2018   Procedure: LATISSIMUS FLAP TO LEFT BREAST;  Surgeon: Wallace Going, DO;  Location: [place];  Service: Plastics;  Laterality:  Left;  Marland Kitchen MASTECTOMY MODIFIED RADICAL Left 08/27/2017  . MASTECTOMY MODIFIED RADICAL Left 08/27/2017   Procedure: LEFT MODIFIED RADICAL MASTECTOMY;  Surgeon: Erroll Luna, MD;  Location: [place];  Service: General;  Laterality: Left;  Marland Kitchen MASTOPEXY Right 03/03/2019   Procedure: RIGHT BREAST MASTOPEXY/REDUCTION;  Surgeon: Wallace Going, DO;  Location: [place];  Service: Plastics;  Laterality: Right;  3 hours, please  . PORT-A-CATH REMOVAL Right 08/27/2017   Procedure: REMOVAL PORT-A-CATH RIGHT CHEST;  Surgeon: Erroll Luna, MD;  Location: [place];  Service: General;  Laterality: Right;  . PORTACATH PLACEMENT Right 01/28/2017   Procedure: INSERTION PORT-A-CATH WITH ULTRASOUND;  Surgeon: Erroll Luna, MD;  Location: [place];  Service: General;  Laterality: Right;  . REMOVAL OF TISSUE EXPANDER AND PLACEMENT OF IMPLANT Left 03/03/2019   Procedure: REMOVAL OF TISSUE EXPANDER AND PLACEMENT OF EXPANDER;  Surgeon: Wallace Going, DO;  Location: [place];  Service: Plastics;  Laterality: Left;  . TISSUE EXPANDER PLACEMENT Left 11/03/2018   Procedure: PLACEMENT OF TISSUE EXPANDER LEFT BREAST;  Surgeon: Wallace Going, DO;  Location: [place];  Service: Plastics;  Laterality: Left;  Total case time is 3.5 hours  . TUBAL LIGATION Bilateral 01/21/2011    There were no vitals filed for this visit.  Subjective Assessment - 04/19/19 1318    Subjective  I am still having that pain  in my left breast but it got better after you massaged it.    Pertinent History  Left breast cancer diagnosed 01/10/17 by mammogram and needle biopsy. Then had neo-adjuvant chemo starting 02/10/17 until 06/26/17; had left mastectomy 08/27/17 with ALND and immediate expander placed. Radiation completed. Expander removed and replaced on May 27, 19 b/c it did not get expanded enough prior to radiation, also had lat flap 11/02/17. Had expander removed and replaced again on 03/03/19  due to a small leak, pt had reduction on R breast at this time    Patient Stated Goals  to get strength back and ROM, be able to unhook bra easily    Currently in Pain?  No/denies    Pain Score  0-No pain                       [facility] Adult PT Treatment/Exercise - 04/19/19 0001      Manual Therapy   Soft tissue mobilization  to left axilla to help decrease tightness, to left inferior breast in pt's area of pain and discomfort with softening noted    Passive ROM  in supine to L shoulder in to flexion and abduction with prolonged holds while performing soft tissue mobilization                  PT Long Term Goals - 03/29/19 1554      PT LONG TERM GOAL #1   Title  Pt will demonstrate 160 degrees of left shoulder flexion to allow her to reach overhead.    Baseline  137    Time  4    Period  Weeks    Status  New    Target Date  04/26/19      PT LONG TERM GOAL #2   Title  Pt will demonstrate 160 degrees of left shoulder abduction to allow her to reach out to the side    Baseline  135    Time  4    Period  Weeks    Status  New    Target Date  04/26/19      PT LONG TERM GOAL #3   Title  Pt will report an 80% improvement in pain and tightness in L axilla to allow pt to move LUE without discomfort.    Time  4    Period  Weeks    Status  New    Target Date  04/26/19      PT LONG TERM GOAL #4   Title  Pt will be independent with a home exercise program for continued strengthening and stretching    Time  4    Period  Weeks    Status  New    Target Date  04/26/19      PT LONG TERM GOAL #5   Title  Pt will report a 50% improvement in back pain in area of lat flap when walking to allow improved comfort    Time  4    Period  Weeks    Status  New    Target Date  04/26/19            Plan - 04/19/19 1355    Clinical Impression Statement  Pt stated the soft tissue mobilization helped with her tightness after last session but it returned and she is very  tight again today. Pt's left axillary tightness limits her ROM. Encouraged pt to purchase a set of over the door pulleys to  use at home to help stretch.    Stability/Clinical Decision Making  Evolving/Moderate complexity    Rehab Potential  Good    PT Frequency  2x / week    PT Duration  4 weeks    PT Treatment/Interventions  ADLs/Self Care Home Management;Therapeutic exercise;Patient/family education;Manual techniques;Manual lymph drainage;Compression bandaging;Scar mobilization;Passive range of motion;Therapeutic activities    PT Next Visit Plan  see if pt was measured for a sleeve, continue PROM to L shoulder,  AAROM exercises, teach Strength ABC before DC    PT Home Exercise Plan  Access Code: [email or web address]    Consulted and Agree with Plan of Care  Patient       Patient will benefit from skilled therapeutic intervention in order to improve the following deficits and impairments:  Decreased range of motion, Impaired UE functional use, Decreased strength, Other (comment), Pain, Postural dysfunction  Visit Diagnosis: Stiffness of left shoulder, not elsewhere classified  Aftercare following surgery for neoplasm  Acute pain of left shoulder     Problem List Patient Active Problem List   Diagnosis Date Noted  . Benign essential HTN 08/17/2018  . Morbid obesity with body mass index (BMI) of 40.0 to 49.9 ([place]) 09/17/2017  . 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 Malignant neoplasm of upper-inner quadrant of left breast in female, estrogen receptor positive ([place]) 07/27/2017  . Genetic testing 03/25/2017  . 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

## 2019-04-21 ENCOUNTER — Ambulatory Visit: Payer: 59 | Admitting: Physical Therapy

## 2019-04-21 ENCOUNTER — Other Ambulatory Visit: Payer: Self-pay

## 2019-04-21 ENCOUNTER — Encounter: Payer: Self-pay | Admitting: Physical Therapy

## 2019-04-21 DIAGNOSIS — Z483 Aftercare following surgery for neoplasm: Secondary | ICD-10-CM

## 2019-04-21 DIAGNOSIS — C50812 Malignant neoplasm of overlapping sites of left female breast: Secondary | ICD-10-CM | POA: Diagnosis not present

## 2019-04-21 DIAGNOSIS — Z5111 Encounter for antineoplastic chemotherapy: Secondary | ICD-10-CM | POA: Diagnosis not present

## 2019-04-21 DIAGNOSIS — Z006 Encounter for examination for normal comparison and control in clinical research program: Secondary | ICD-10-CM | POA: Diagnosis not present

## 2019-04-21 DIAGNOSIS — M25612 Stiffness of left shoulder, not elsewhere classified: Secondary | ICD-10-CM

## 2019-04-21 DIAGNOSIS — Z79899 Other long term (current) drug therapy: Secondary | ICD-10-CM | POA: Diagnosis not present

## 2019-04-21 DIAGNOSIS — Z170 Estrogen receptor positive status [ER+]: Secondary | ICD-10-CM | POA: Diagnosis not present

## 2019-04-21 DIAGNOSIS — M25512 Pain in left shoulder: Secondary | ICD-10-CM

## 2019-04-21 IMAGING — MG STEREOTACTIC CORE NEEDLE BIOPSY
5 series · 5 of 5 positions shown · non-contrast
Comparison: Previous exams.

CLINICAL DATA: Patient with suspicious left breast calcifications.

EXAM:
LEFT BREAST STEREOTACTIC CORE NEEDLE BIOPSY

[L (1 of 5)]
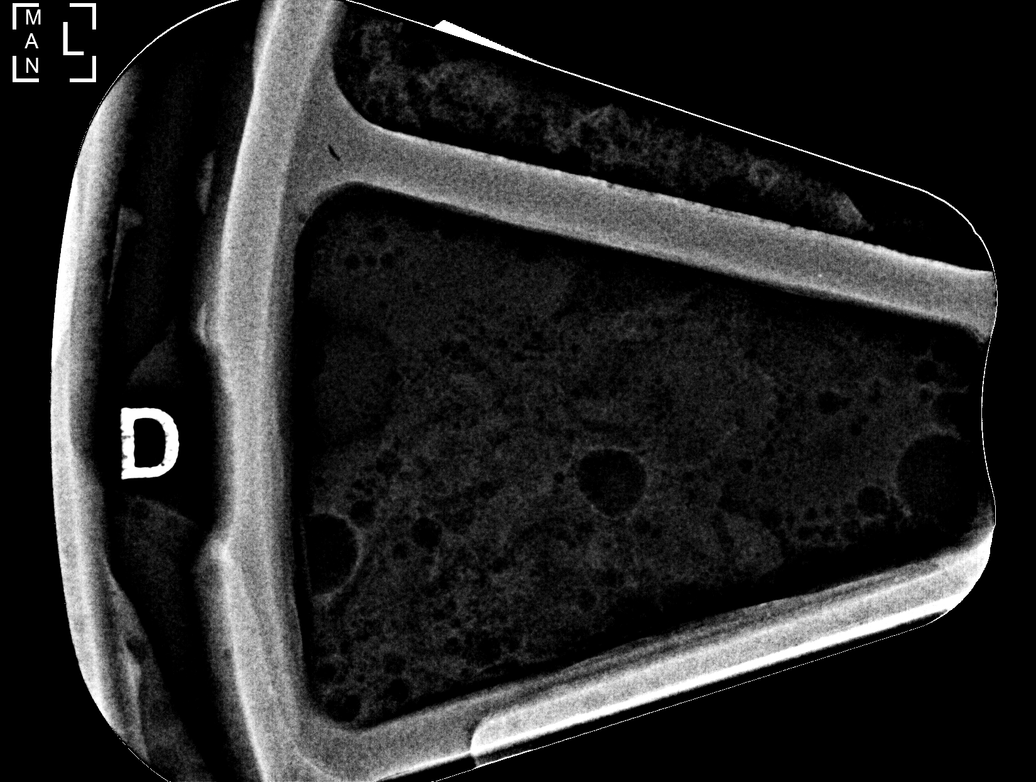

[L (2 of 5)]
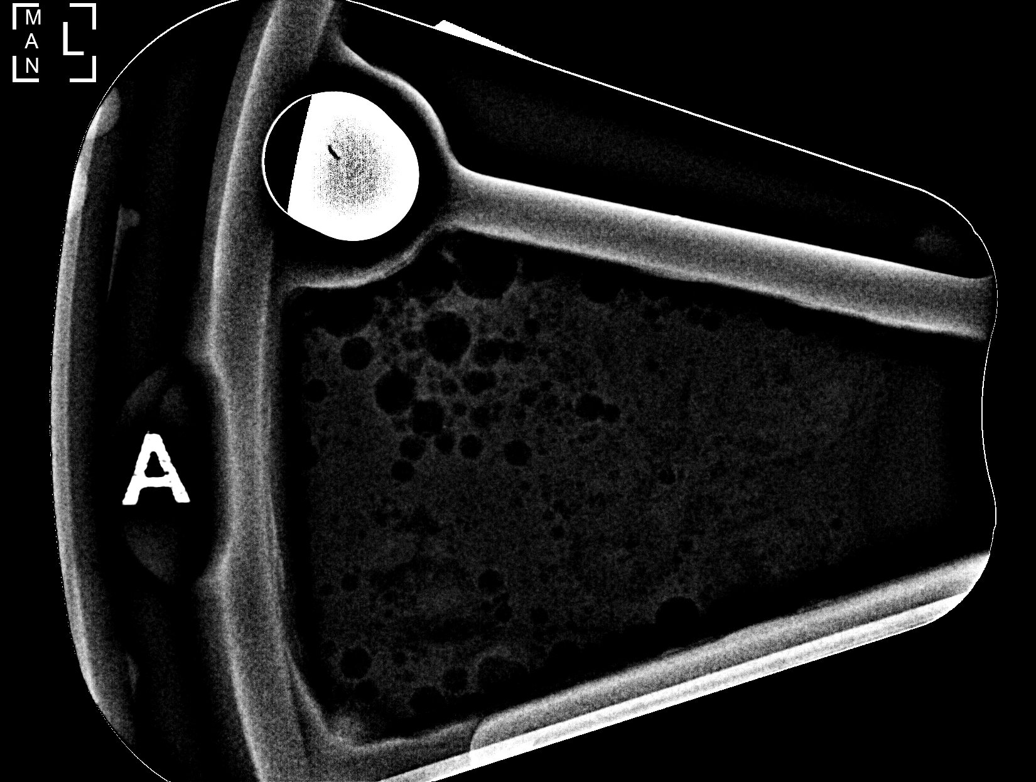

[L (3 of 5)]
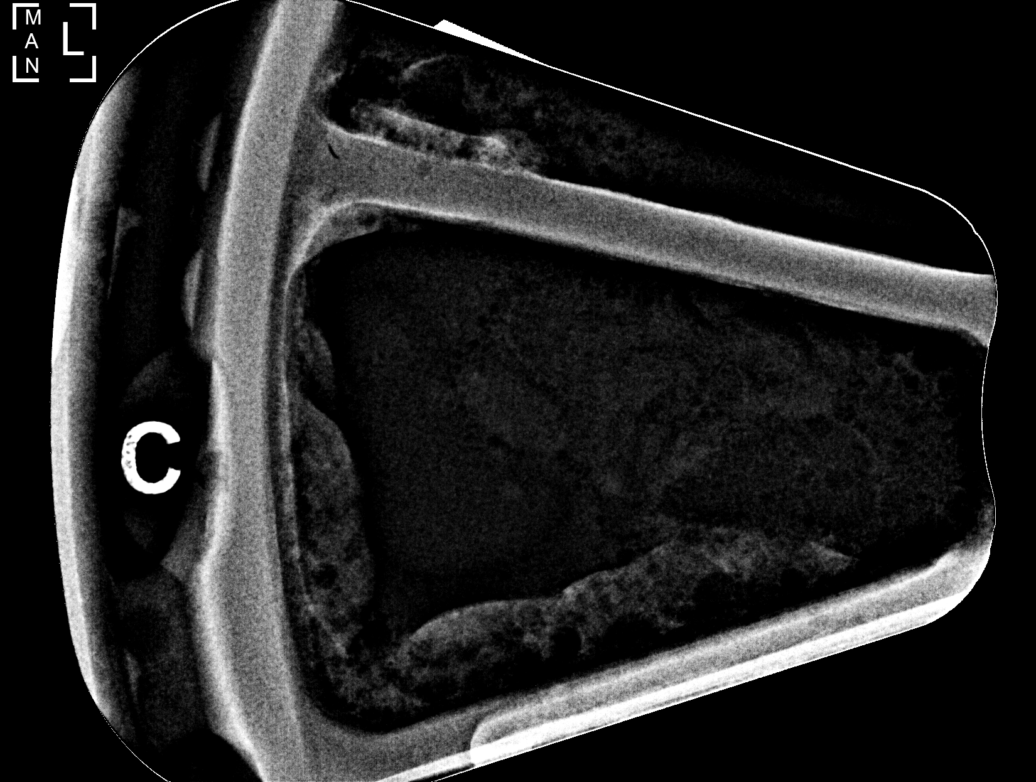

[L (4 of 5)]
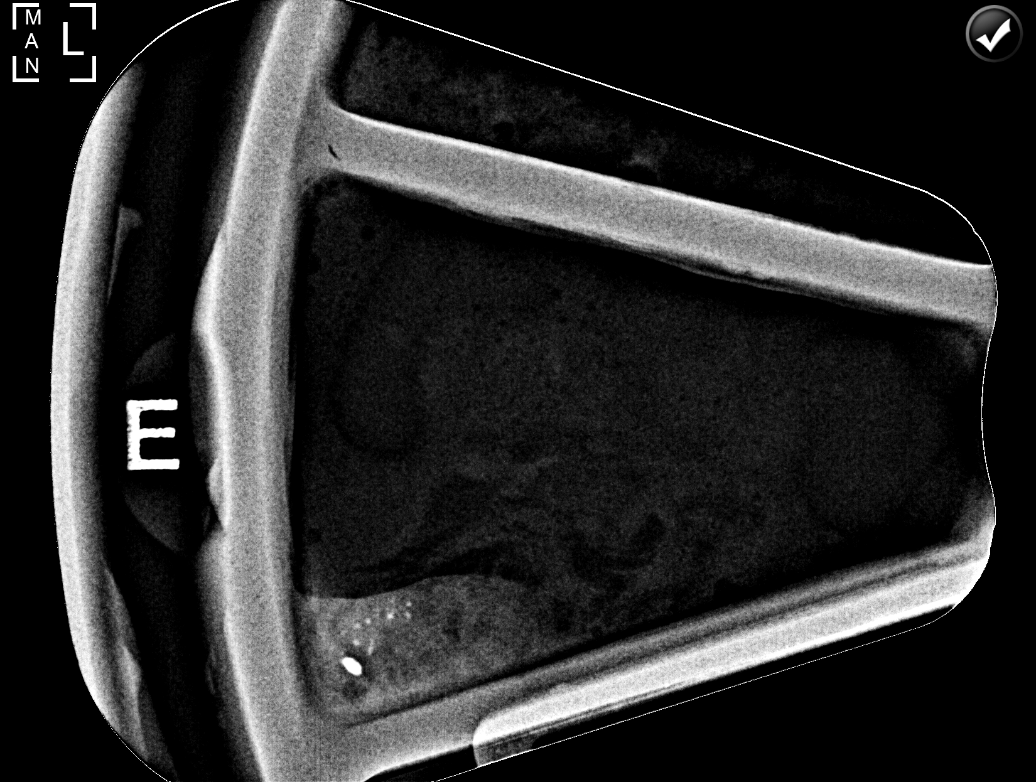

[L (5 of 5)]
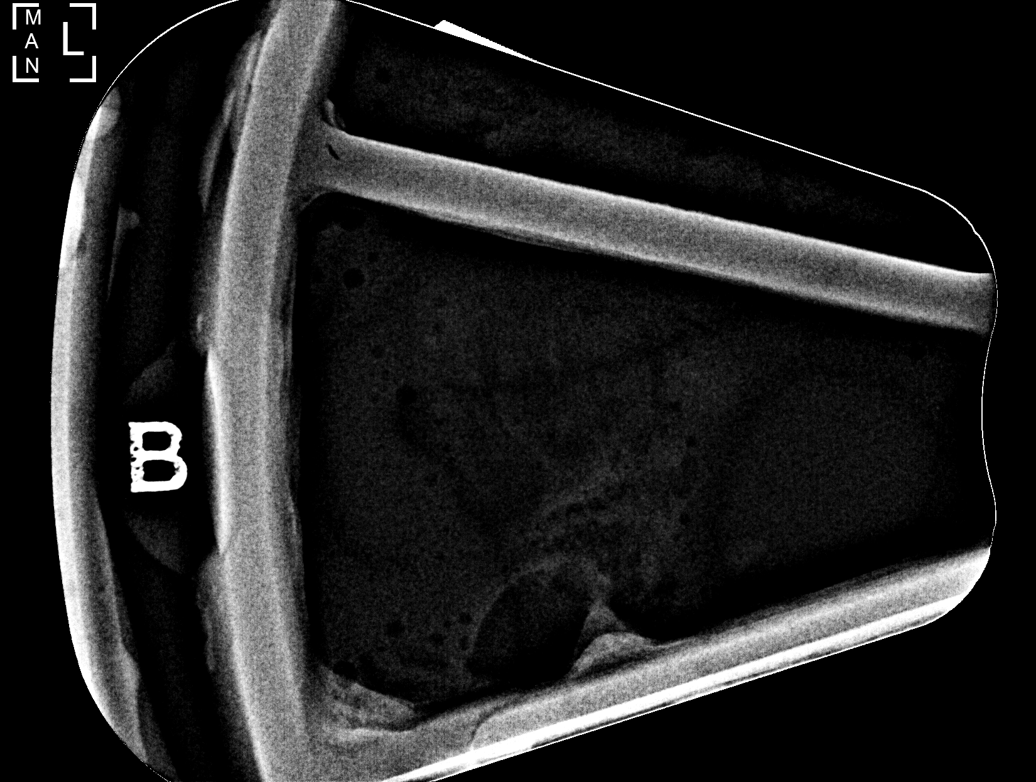

[5 of 5 positions shown; findings below may reference images not displayed]



Using sterile technique and 1% Lidocaine as local anesthetic, under
stereotactic guidance, a 9 gauge vacuum assisted device was used to
perform core needle biopsy of calcifications in the lower outer left
breast using a cranial approach. Specimen radiograph was performed
showing calcifications. Specimens with calcifications are identified
for pathology.

Lesion quadrant: Lower outer quadrant

At the conclusion of the procedure, a coil shaped tissue marker clip
was deployed into the biopsy cavity. Follow-up 2-view mammogram was
performed and dictated separately.
IMPRESSION: Stereotactic-guided biopsy of left breast calcifications. No
apparent complications.

## 2019-04-21 NOTE — Therapy (Signed)
[place], [place], [address] Phone: [phone]   Fax:  [phone]  Physical Therapy Treatment  Patient Details  Name: Mackenzie [name] MRN: [id number] Date of Birth: 28-Oct-1970 Referring Provider (PT): Dillingham   Encounter Date: 04/21/2019  PT End of Session - 04/21/19 1654    Visit Number  8    Number of Visits  9    Date for PT Re-Evaluation  04/26/19    PT Start Time  [id number]   pt arrived late   PT Stop Time  1650    PT Time Calculation (min)  37 min    Activity Tolerance  Patient tolerated treatment well    Behavior During Therapy  [facility] for tasks assessed/performed       Past Medical History:  Diagnosis Date  . Abnormal glucose 2018  . 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 Allergic rhinitis 2012  . Anemia 01/27/2017   prior to starting chemotherapy  . Breast cancer ([place]) 01/14/2017   Left breast  . 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 Hot flashes 03/2017  . Hyperlipidemia 03/20/2016  . Morbid obesity with body mass index (BMI) of 40.0 to 49.9 ([place]) 09/17/2017  . NCGS (non-celiac gluten sensitivity)   . Pre-diabetes   . Seasonal allergies 2012   seasonal allergies causes allergic rhinitis and itchy, dry eyes per pt  . Vitamin D deficiency 05/2015    Past Surgical History:  Procedure Laterality Date  . BREAST RECONSTRUCTION WITH PLACEMENT OF TISSUE EXPANDER AND FLEX HD (ACELLULAR HYDRATED DERMIS) Left 08/27/2017   Procedure: LEFT BREAST RECONSTRUCTION WITH PLACEMENT OF TISSUE EXPANDER AND FLEX HD;  Surgeon: Wallace Going, DO;  Location: [place];  Service: Plastics;  Laterality: Left;  . CESAREAN SECTION     x2  . LATISSIMUS FLAP TO BREAST Left 11/03/2018  . LATISSIMUS FLAP TO BREAST Left 11/03/2018   Procedure: LATISSIMUS FLAP TO LEFT BREAST;  Surgeon: Wallace Going, DO;  Location: [place];  Service: Plastics;  Laterality:  Left;  Marland Kitchen MASTECTOMY MODIFIED RADICAL Left 08/27/2017  . MASTECTOMY MODIFIED RADICAL Left 08/27/2017   Procedure: LEFT MODIFIED RADICAL MASTECTOMY;  Surgeon: Erroll Luna, MD;  Location: [place];  Service: General;  Laterality: Left;  Marland Kitchen MASTOPEXY Right 03/03/2019   Procedure: RIGHT BREAST MASTOPEXY/REDUCTION;  Surgeon: Wallace Going, DO;  Location: [place];  Service: Plastics;  Laterality: Right;  3 hours, please  . PORT-A-CATH REMOVAL Right 08/27/2017   Procedure: REMOVAL PORT-A-CATH RIGHT CHEST;  Surgeon: Erroll Luna, MD;  Location: [place];  Service: General;  Laterality: Right;  . PORTACATH PLACEMENT Right 01/28/2017   Procedure: INSERTION PORT-A-CATH WITH ULTRASOUND;  Surgeon: Erroll Luna, MD;  Location: [place];  Service: General;  Laterality: Right;  . REMOVAL OF TISSUE EXPANDER AND PLACEMENT OF IMPLANT Left 03/03/2019   Procedure: REMOVAL OF TISSUE EXPANDER AND PLACEMENT OF EXPANDER;  Surgeon: Wallace Going, DO;  Location: [place];  Service: Plastics;  Laterality: Left;  . TISSUE EXPANDER PLACEMENT Left 11/03/2018   Procedure: PLACEMENT OF TISSUE EXPANDER LEFT BREAST;  Surgeon: Wallace Going, DO;  Location: [place];  Service: Plastics;  Laterality: Left;  Total case time is 3.5 hours  . TUBAL LIGATION Bilateral 01/21/2011    There were no vitals filed for this visit.  Subjective Assessment - 04/21/19 1614    Subjective  I am feeling about the same  but I feel like I keep getting tight.    Pertinent History  Left breast cancer diagnosed 01/10/17 by mammogram and needle biopsy. Then had neo-adjuvant chemo starting 02/10/17 until 06/26/17; had left mastectomy 08/27/17 with ALND and immediate expander placed. Radiation completed. Expander removed and replaced on May 27, 19 b/c it did not get expanded enough prior to radiation, also had lat flap 11/02/17. Had expander removed and replaced again on 03/03/19 due to a small leak, pt  had reduction on R breast at this time    Patient Stated Goals  to get strength back and ROM, be able to unhook bra easily    Currently in Pain?  No/denies    Pain Score  0-No pain         [facility] PT Assessment - 04/21/19 0001      AROM   Left Shoulder Flexion  156 Degrees    Left Shoulder ABduction  166 Degrees                   [facility] Adult PT Treatment/Exercise - 04/21/19 0001      Shoulder Exercises: Supine   Other Supine Exercises  lying supine over foam roll with arms outstretched for a pec stretch x 2 min      Shoulder Exercises: Standing   External Rotation  Strengthening;Left;10 reps;Theraband   pt returned therapist demo   Theraband Level (Shoulder External Rotation)  Level 2 (Red)    Internal Rotation  Strengthening;Left;10 reps;Theraband   pt returned therapist demo   Theraband Level (Shoulder Internal Rotation)  Level 2 (Red)    Flexion  Strengthening;Left;10 reps;Theraband   pt returned therapist demo   Theraband Level (Shoulder Flexion)  Level 2 (Red)    Extension  Strengthening;Left;10 reps;Theraband   pt returned therapist demo   Other Standing Exercises  wall washing with wash cloth under L hand x 10 reps both directions    Other Standing Exercises  using foam roll under forearms- rolling up wall until stretch was felt in axilla x 10 reps      Manual Therapy   Soft tissue mobilization  to left axilla to help decrease tightness, to left inferior breast in pt's area of pain and discomfort with softening noted    Passive ROM  in supine lying over foam roll to L shoulder in to flexion and abduction and ER with prolonged holds while performing soft tissue mobilization                  PT Long Term Goals - 03/29/19 1554      PT LONG TERM GOAL #1   Title  Pt will demonstrate 160 degrees of left shoulder flexion to allow her to reach overhead.    Baseline  137    Time  4    Period  Weeks    Status  New    Target Date  04/26/19      PT  LONG TERM GOAL #2   Title  Pt will demonstrate 160 degrees of left shoulder abduction to allow her to reach out to the side    Baseline  135    Time  4    Period  Weeks    Status  New    Target Date  04/26/19      PT LONG TERM GOAL #3   Title  Pt will report an 80% improvement in pain and tightness in L axilla to allow pt to move LUE without discomfort.  Time  4    Period  Weeks    Status  New    Target Date  04/26/19      PT LONG TERM GOAL #4   Title  Pt will be independent with a home exercise program for continued strengthening and stretching    Time  4    Period  Weeks    Status  New    Target Date  04/26/19      PT LONG TERM GOAL #5   Title  Pt will report a 50% improvement in back pain in area of lat flap when walking to allow improved comfort    Time  4    Period  Weeks    Status  New    Target Date  04/26/19            Plan - 04/21/19 1655    Clinical Impression Statement  Pt recently purchased a foam roll so instructed pt today in exercises with foam roll to improve shoulder ROM and decrease chest tightness. Remeasured ROM at end of session and pt has gained nearly full ROM following stretching today. She still has increased tightness in left axilla but this improved with new exercises and supine over foam roll with arms outstretched.    Rehab Potential  Good    Clinical Impairments Affecting Rehab Potential  hx of radiation    PT Frequency  2x / week    PT Duration  4 weeks    PT Treatment/Interventions  ADLs/Self Care Home Management;Therapeutic exercise;Patient/family education;Manual techniques;Manual lymph drainage;Compression bandaging;Scar mobilization;Passive range of motion;Therapeutic activities    PT Next Visit Plan  recert, see if sleeve came, see how new exercises are going and measure at beginning of session.  continue PROM to L shoulder,  AAROM exercises, teach Strength ABC before DC    PT Home Exercise Plan  Access Code: [email or web address], supine over  foam roll, rockwood for LUE, wall washing    Consulted and Agree with Plan of Care  Patient       Patient will benefit from skilled therapeutic intervention in order to improve the following deficits and impairments:  Decreased range of motion, Impaired UE functional use, Decreased strength, Other (comment), Pain, Postural dysfunction  Visit Diagnosis: Stiffness of left shoulder, not elsewhere classified  Aftercare following surgery for neoplasm  Acute pain of left shoulder     Problem List Patient Active Problem List   Diagnosis Date Noted  . Benign essential HTN 08/17/2018  . Morbid obesity with body mass index (BMI) of 40.0 to 49.9 ([place]) 09/17/2017  . 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 Malignant neoplasm of upper-inner quadrant of left breast in female, estrogen receptor positive ([place]) 07/27/2017  . Genetic testing 03/25/2017  . Malignant 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, estrogen receptor positive ([place]) 01/23/2017  . 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

## 2019-04-21 NOTE — Patient Instructions (Signed)
Strengthening: Resisted Flexion    Cancer Rehab [phone]    Hold tubing with left arm at side. Pull forward and up. Move shoulder through pain-free range of motion. Repeat _10___ times per set. Do _1-2___ sessions per day.  Strengthening: Resisted Internal Rotation    Hold tubing in left hand, elbow at side and forearm out. Rotate forearm in across body. Repeat _10___ times per set. Do _1-2___ sessions per day.  Strengthening: Resisted Extension    Hold tubing in left hand, arm forward. Pull arm back, elbow straight. Repeat __10__ times per set. Do __1-2__ sessions per day.   Strengthening: Resisted External Rotation    Hold tubing in left hand, elbow at side and forearm across body. Rotate forearm out. Repeat _10___ times per set. Do __1-2__ sessions per day.   -- Place forearms on foam roll and roll up wall until you feel a stretch in the armpit x 10   -- Wall washing- wash cloth under hand making large circles on wall in both directions x 10  -- Lying over foam roll with head supported on roll and arms out to sides x 5-10 min

## 2019-04-22 ENCOUNTER — Encounter: Payer: Self-pay | Admitting: Surgical

## 2019-04-22 ENCOUNTER — Ambulatory Visit (INDEPENDENT_AMBULATORY_CARE_PROVIDER_SITE_OTHER): Payer: 59 | Admitting: Surgical

## 2019-04-22 VITALS — BP 133/84 | HR 94 | Temp 97.7°F | Ht 66.0 in | Wt 270.2 lb

## 2019-04-22 DIAGNOSIS — C50812 Malignant neoplasm of overlapping sites of left female breast: Secondary | ICD-10-CM

## 2019-04-22 DIAGNOSIS — Z170 Estrogen receptor positive status [ER+]: Secondary | ICD-10-CM

## 2019-04-22 DIAGNOSIS — Z9012 Acquired absence of left breast and nipple: Secondary | ICD-10-CM

## 2019-04-22 NOTE — Progress Notes (Signed)
   Subjective:     Patient ID: Mackenzie [name], female    DOB: 10-17-1970, 48 y.o.   MRN: [id number]  Chief Complaint  Patient presents with  . Follow-up    2 weeks    HPI: The patient is a 48 y.o. female here for follow-up on her left breast reconstruction.  She previously underwent right breast mastopexy/reduction to match her left breast reconstruction.  She currently has an expander in place on the left.  She is very happy with the size as of now and believes she may only need 1 more fill prior to surgery.  She is scheduled for preop appointment approximately 2 weeks before her surgery and would like her final fill at that time.  Her right breast is healing really well, no complaints she is very pleased with the size.  She has a small scabbing area at the junction of the vertical limb and NAC.  The skin tear on her right breast is healing well and beginning to pigment.  Left breast well-healed, latissimus flap with good capillary refill.  She has been working with PT.  No fever, chills, nausea, vomiting.  Review of Systems  Constitutional: Positive for activity change. Negative for appetite change, chills, diaphoresis, fatigue and fever.  Cardiovascular: Negative for chest pain.  Gastrointestinal: Negative for diarrhea, nausea and vomiting.  Musculoskeletal: Negative for myalgias.  Skin: Negative for color change, pallor, rash and wound.  Neurological: Negative for dizziness and weakness.   Objective:   Vital Signs BP 133/84 (BP Location: Left Arm, Patient Position: Sitting, Cuff Size: Large)   Pulse 94   Temp 97.7 F (36.5 C) (Temporal)   Ht 5\' 6"  (1.676 m)   Wt 270 lb 3.2 oz (122.6 kg)   SpO2 99%   BMI 43.61 kg/m  Vital Signs and Nursing Note Reviewed Chaperone present Physical Exam  Constitutional: She is oriented to person, place, and time and well-developed, well-nourished, and in no distress.  HENT:  Head: Normocephalic and atraumatic.  Cardiovascular: Normal  rate.  Pulmonary/Chest: Effort normal.    Musculoskeletal: Normal range of motion.  Neurological: She is alert and oriented to person, place, and time. Gait normal.  Skin: Skin is warm and dry. No rash noted. She is not diaphoretic. No erythema. No pallor.  Psychiatric: Mood and affect normal.      Assessment/Plan:     ICD-10-CM   1. 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 Acquired absence of left breast  Z90.12    Mackenzie [name] is doing really well.  She is pleased with her current size and believes she only needs 1 more fill on the left.  I agree with her and we will plan to do this at her preop visit.  We placed injectable saline in the Expander using a sterile technique: Left: 60 cc for a total of 660 / 650 cc  We will place order for implant at this visit.  Carola Rhine Benetta Maclaren, PA-C 04/22/2019, 3:37 PM

## 2019-04-26 ENCOUNTER — Other Ambulatory Visit: Payer: Self-pay

## 2019-04-26 ENCOUNTER — Encounter: Payer: Self-pay | Admitting: Physical Therapy

## 2019-04-26 ENCOUNTER — Ambulatory Visit: Payer: 59 | Admitting: Physical Therapy

## 2019-04-26 DIAGNOSIS — Z483 Aftercare following surgery for neoplasm: Secondary | ICD-10-CM

## 2019-04-26 DIAGNOSIS — M25512 Pain in left shoulder: Secondary | ICD-10-CM

## 2019-04-26 DIAGNOSIS — M6281 Muscle weakness (generalized): Secondary | ICD-10-CM

## 2019-04-26 DIAGNOSIS — Z5111 Encounter for antineoplastic chemotherapy: Secondary | ICD-10-CM | POA: Diagnosis not present

## 2019-04-26 DIAGNOSIS — R252 Cramp and spasm: Secondary | ICD-10-CM

## 2019-04-26 DIAGNOSIS — Z006 Encounter for examination for normal comparison and control in clinical research program: Secondary | ICD-10-CM | POA: Diagnosis not present

## 2019-04-26 DIAGNOSIS — Z79899 Other long term (current) drug therapy: Secondary | ICD-10-CM | POA: Diagnosis not present

## 2019-04-26 DIAGNOSIS — M25612 Stiffness of left shoulder, not elsewhere classified: Secondary | ICD-10-CM

## 2019-04-26 DIAGNOSIS — Z170 Estrogen receptor positive status [ER+]: Secondary | ICD-10-CM | POA: Diagnosis not present

## 2019-04-26 DIAGNOSIS — C50812 Malignant neoplasm of overlapping sites of left female breast: Secondary | ICD-10-CM | POA: Diagnosis not present

## 2019-04-26 NOTE — Therapy (Signed)
[place], [place], [address] Phone: [phone]   Fax:  [phone]  Physical Therapy Treatment  Patient Details  Name: Mackenzie [name] MRN: [id number] Date of Birth: March 18, 1971 Referring Provider (PT): Dillingham   Encounter Date: 04/26/2019  PT End of Session - 04/26/19 1656    Visit Number  9    Number of Visits  17    Date for PT Re-Evaluation  05/24/19    PT Start Time  6962   pt arrived late   PT Stop Time  1653    PT Time Calculation (min)  40 min    Activity Tolerance  Patient tolerated treatment well    Behavior During Therapy  [facility] for tasks assessed/performed       Past Medical History:  Diagnosis Date  . Abnormal glucose 2018  . 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 Allergic rhinitis 2012  . Anemia 01/27/2017   prior to starting chemotherapy  . Breast cancer ([place]) 01/14/2017   Left breast  . 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 Hot flashes 03/2017  . Hyperlipidemia 03/20/2016  . Morbid obesity with body mass index (BMI) of 40.0 to 49.9 ([place]) 09/17/2017  . NCGS (non-celiac gluten sensitivity)   . Pre-diabetes   . Seasonal allergies 2012   seasonal allergies causes allergic rhinitis and itchy, dry eyes per pt  . Vitamin D deficiency 05/2015    Past Surgical History:  Procedure Laterality Date  . BREAST RECONSTRUCTION WITH PLACEMENT OF TISSUE EXPANDER AND FLEX HD (ACELLULAR HYDRATED DERMIS) Left 08/27/2017   Procedure: LEFT BREAST RECONSTRUCTION WITH PLACEMENT OF TISSUE EXPANDER AND FLEX HD;  Surgeon: Wallace Going, DO;  Location: [place];  Service: Plastics;  Laterality: Left;  . CESAREAN SECTION     x2  . LATISSIMUS FLAP TO BREAST Left 11/03/2018  . LATISSIMUS FLAP TO BREAST Left 11/03/2018   Procedure: LATISSIMUS FLAP TO LEFT BREAST;  Surgeon: Wallace Going, DO;  Location: [place];  Service: Plastics;  Laterality:  Left;  Marland Kitchen MASTECTOMY MODIFIED RADICAL Left 08/27/2017  . MASTECTOMY MODIFIED RADICAL Left 08/27/2017   Procedure: LEFT MODIFIED RADICAL MASTECTOMY;  Surgeon: Erroll Luna, MD;  Location: [place];  Service: General;  Laterality: Left;  Marland Kitchen MASTOPEXY Right 03/03/2019   Procedure: RIGHT BREAST MASTOPEXY/REDUCTION;  Surgeon: Wallace Going, DO;  Location: [place];  Service: Plastics;  Laterality: Right;  3 hours, please  . PORT-A-CATH REMOVAL Right 08/27/2017   Procedure: REMOVAL PORT-A-CATH RIGHT CHEST;  Surgeon: Erroll Luna, MD;  Location: [place];  Service: General;  Laterality: Right;  . PORTACATH PLACEMENT Right 01/28/2017   Procedure: INSERTION PORT-A-CATH WITH ULTRASOUND;  Surgeon: Erroll Luna, MD;  Location: [place];  Service: General;  Laterality: Right;  . REMOVAL OF TISSUE EXPANDER AND PLACEMENT OF IMPLANT Left 03/03/2019   Procedure: REMOVAL OF TISSUE EXPANDER AND PLACEMENT OF EXPANDER;  Surgeon: Wallace Going, DO;  Location: [place];  Service: Plastics;  Laterality: Left;  . TISSUE EXPANDER PLACEMENT Left 11/03/2018   Procedure: PLACEMENT OF TISSUE EXPANDER LEFT BREAST;  Surgeon: Wallace Going, DO;  Location: [place];  Service: Plastics;  Laterality: Left;  Total case time is 3.5 hours  . TUBAL LIGATION Bilateral 01/21/2011    There were no vitals filed for this visit.  Subjective Assessment - 04/26/19 1613    Subjective  My shoulder is feeling pretty good  today. I think the new exercises are helping.    Pertinent History  Left breast cancer diagnosed 01/10/17 by mammogram and needle biopsy. Then had neo-adjuvant chemo starting 02/10/17 until 06/26/17; had left mastectomy 08/27/17 with ALND and immediate expander placed. Radiation completed. Expander removed and replaced on May 27, 19 b/c it did not get expanded enough prior to radiation, also had lat flap 11/02/17. Had expander removed and replaced again on 03/03/19 due to a  small leak, pt had reduction on R breast at this time    Patient Stated Goals  to get strength back and ROM, be able to unhook bra easily    Currently in Pain?  No/denies    Pain Score  0-No pain         [facility] PT Assessment - 04/26/19 0001      AROM   Left Shoulder Flexion  140 Degrees    Left Shoulder ABduction  160 Degrees                   [facility] Adult PT Treatment/Exercise - 04/26/19 0001      Manual Therapy   Soft tissue mobilization  in right sidelying to left scapular and mid back in area of tightness near lat flap scar, significant release noted of muscles in this area                  PT Long Term Goals - 04/26/19 1613      PT LONG TERM GOAL #1   Title  Pt will demonstrate 160 degrees of left shoulder flexion to allow her to reach overhead.    Baseline  137, 04/26/19- 141    Time  4    Period  Weeks    Status  On-going      PT LONG TERM GOAL #2   Title  Pt will demonstrate 160 degrees of left shoulder abduction to allow her to reach out to the side    Baseline  135, 04/26/19- 160    Time  4    Period  Weeks    Status  Achieved      PT LONG TERM GOAL #3   Title  Pt will report an 80% improvement in pain and tightness in L axilla to allow pt to move LUE without discomfort.    Baseline  04/26/19- 60% improved    Time  4    Period  Weeks    Status  On-going      PT LONG TERM GOAL #4   Title  Pt will be independent with a home exercise program for continued strengthening and stretching    Time  4    Period  Weeks    Status  On-going      PT LONG TERM GOAL #5   Title  Pt will report a 50% improvement in back pain in area of lat flap when walking to allow improved comfort    Baseline  04/26/19- 0% improvement    Time  4    Period  Weeks    Status  On-going            Plan - 04/26/19 1657    Clinical Impression Statement  Assessed pt's progress towards goals in therapy. She has met her abduction ROM goal and has made some  progress towards her flexion ROM goal. She is still having pain in her back in area of lat flap scar so focused on soft tissue mobilization to this area today  with significant improvement noted. Pt would benefit from additional skilled PT visits to continue to progress pt towards goals and improve left shoulder flexion ROM prior to her next surgery on 05/25/19.    PT Frequency  2x / week    PT Duration  4 weeks    PT Treatment/Interventions  ADLs/Self Care Home Management;Therapeutic exercise;Patient/family education;Manual techniques;Manual lymph drainage;Compression bandaging;Scar mobilization;Passive range of motion;Therapeutic activities    PT Next Visit Plan  see if sleeve came,  continue PROM to L shoulder, STM to L pec,  AAROM exercises, teach Strength ABC before DC    PT Home Exercise Plan  Access Code: [email or web address], supine over foam roll, rockwood for LUE, wall washing    Consulted and Agree with Plan of Care  Patient       Patient will benefit from skilled therapeutic intervention in order to improve the following deficits and impairments:  Decreased range of motion, Impaired UE functional use, Decreased strength, Other (comment), Pain, Postural dysfunction  Visit Diagnosis: Stiffness of left shoulder, not elsewhere classified  Aftercare following surgery for neoplasm  Acute pain of left shoulder  Cramp and spasm  Muscle weakness (generalized)     Problem List Patient Active Problem List   Diagnosis Date Noted  . Benign essential HTN 08/17/2018  . Morbid obesity with body mass index (BMI) of 40.0 to 49.9 ([place]) 09/17/2017  . 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 Malignant neoplasm of upper-inner quadrant of left breast in female, estrogen receptor positive ([place]) 07/27/2017  . Genetic testing 03/25/2017  . Malignant 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, estrogen receptor positive ([place]) 01/23/2017  . Carcinoma of breast metastatic to axillary lymph  node, left ([place]) 01/23/2017    Allyson Sabal [facility] 04/26/2019, 5:00 PM  [place], [place], [address] Phone: [phone]   Fax:  [phone]  Name: AUDRIONNA LAMPTON MRN: [id number] Date of Birth: 10/02/1970  Manus Gunning, PT 04/26/19 5:00 PM

## 2019-04-27 ENCOUNTER — Telehealth: Payer: Self-pay | Admitting: *Deleted

## 2019-04-27 DIAGNOSIS — Z170 Estrogen receptor positive status [ER+]: Secondary | ICD-10-CM

## 2019-04-27 DIAGNOSIS — C50812 Malignant neoplasm of overlapping sites of left female breast: Secondary | ICD-10-CM

## 2019-04-27 NOTE — Telephone Encounter (Signed)
Sherrian Divers; Called patient regarding study lab appointment this Friday 04/29/19.  Need to notify patient of new study consent form to review/ sign prior to lab appointment. Also need to remind patient to fast for at least 8 hours prior to lab appointment.  Patient's voicemail was full and I could not leave a message. Will attempt again tomorrow.  Foye Spurling, BSN, RN Clinical Research Nurse 04/27/2019 3:36 PM

## 2019-04-28 ENCOUNTER — Ambulatory Visit: Payer: 59 | Admitting: Physical Therapy

## 2019-04-28 ENCOUNTER — Telehealth: Payer: Self-pay | Admitting: *Deleted

## 2019-04-28 ENCOUNTER — Other Ambulatory Visit: Payer: Self-pay

## 2019-04-28 ENCOUNTER — Encounter: Payer: Self-pay | Admitting: Physical Therapy

## 2019-04-28 DIAGNOSIS — M25512 Pain in left shoulder: Secondary | ICD-10-CM

## 2019-04-28 DIAGNOSIS — M25612 Stiffness of left shoulder, not elsewhere classified: Secondary | ICD-10-CM

## 2019-04-28 DIAGNOSIS — Z170 Estrogen receptor positive status [ER+]: Secondary | ICD-10-CM | POA: Diagnosis not present

## 2019-04-28 DIAGNOSIS — Z79899 Other long term (current) drug therapy: Secondary | ICD-10-CM | POA: Diagnosis not present

## 2019-04-28 DIAGNOSIS — C50812 Malignant neoplasm of overlapping sites of left female breast: Secondary | ICD-10-CM | POA: Diagnosis not present

## 2019-04-28 DIAGNOSIS — Z006 Encounter for examination for normal comparison and control in clinical research program: Secondary | ICD-10-CM | POA: Diagnosis not present

## 2019-04-28 DIAGNOSIS — Z5111 Encounter for antineoplastic chemotherapy: Secondary | ICD-10-CM | POA: Diagnosis not present

## 2019-04-28 DIAGNOSIS — Z483 Aftercare following surgery for neoplasm: Secondary | ICD-10-CM

## 2019-04-28 NOTE — Therapy (Signed)
[place], [place], [address] Phone: [phone]   Fax:  [phone]  Physical Therapy Treatment  Patient Details  Name: ALINA FLASH MRN: [id number] Date of Birth: 1971-05-28 Referring Provider (PT): Dillingham   Encounter Date: 04/28/2019  PT End of Session - 04/28/19 [id number]    Visit Number  10    Number of Visits  17    Date for PT Re-Evaluation  05/24/19    PT Start Time  1608    PT Stop Time  1647    PT Time Calculation (min)  39 min    Activity Tolerance  Patient tolerated treatment well    Behavior During Therapy  [facility] for tasks assessed/performed       Past Medical History:  Diagnosis Date  . Abnormal glucose 2018  . 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 Allergic rhinitis 2012  . Anemia 01/27/2017   prior to starting chemotherapy  . Breast cancer ([place]) 01/14/2017   Left breast  . 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 Hot flashes 03/2017  . Hyperlipidemia 03/20/2016  . Morbid obesity with body mass index (BMI) of 40.0 to 49.9 ([place]) 09/17/2017  . NCGS (non-celiac gluten sensitivity)   . Pre-diabetes   . Seasonal allergies 2012   seasonal allergies causes allergic rhinitis and itchy, dry eyes per pt  . Vitamin D deficiency 05/2015    Past Surgical History:  Procedure Laterality Date  . BREAST RECONSTRUCTION WITH PLACEMENT OF TISSUE EXPANDER AND FLEX HD (ACELLULAR HYDRATED DERMIS) Left 08/27/2017   Procedure: LEFT BREAST RECONSTRUCTION WITH PLACEMENT OF TISSUE EXPANDER AND FLEX HD;  Surgeon: Wallace Going, DO;  Location: [place];  Service: Plastics;  Laterality: Left;  . CESAREAN SECTION     x2  . LATISSIMUS FLAP TO BREAST Left 11/03/2018  . LATISSIMUS FLAP TO BREAST Left 11/03/2018   Procedure: LATISSIMUS FLAP TO LEFT BREAST;  Surgeon: Wallace Going, DO;  Location: [place];  Service: Plastics;  Laterality: Left;  Marland Kitchen  MASTECTOMY MODIFIED RADICAL Left 08/27/2017  . MASTECTOMY MODIFIED RADICAL Left 08/27/2017   Procedure: LEFT MODIFIED RADICAL MASTECTOMY;  Surgeon: Erroll Luna, MD;  Location: [place];  Service: General;  Laterality: Left;  Marland Kitchen MASTOPEXY Right 03/03/2019   Procedure: RIGHT BREAST MASTOPEXY/REDUCTION;  Surgeon: Wallace Going, DO;  Location: [place];  Service: Plastics;  Laterality: Right;  3 hours, please  . PORT-A-CATH REMOVAL Right 08/27/2017   Procedure: REMOVAL PORT-A-CATH RIGHT CHEST;  Surgeon: Erroll Luna, MD;  Location: [place];  Service: General;  Laterality: Right;  . PORTACATH PLACEMENT Right 01/28/2017   Procedure: INSERTION PORT-A-CATH WITH ULTRASOUND;  Surgeon: Erroll Luna, MD;  Location: [place];  Service: General;  Laterality: Right;  . REMOVAL OF TISSUE EXPANDER AND PLACEMENT OF IMPLANT Left 03/03/2019   Procedure: REMOVAL OF TISSUE EXPANDER AND PLACEMENT OF EXPANDER;  Surgeon: Wallace Going, DO;  Location: [place];  Service: Plastics;  Laterality: Left;  . TISSUE EXPANDER PLACEMENT Left 11/03/2018   Procedure: PLACEMENT OF TISSUE EXPANDER LEFT BREAST;  Surgeon: Wallace Going, DO;  Location: [place];  Service: Plastics;  Laterality: Left;  Total case time is 3.5 hours  . TUBAL LIGATION Bilateral 01/21/2011    There were no vitals filed for this visit.  Subjective Assessment - 04/28/19 1609    Subjective  I felt pretty good after last session. I got measured  for my sleeve and I am waiting for it.    Pertinent History  Left breast cancer diagnosed 01/10/17 by mammogram and needle biopsy. Then had neo-adjuvant chemo starting 02/10/17 until 06/26/17; had left mastectomy 08/27/17 with ALND and immediate expander placed. Radiation completed. Expander removed and replaced on May 27, 19 b/c it did not get expanded enough prior to radiation, also had lat flap 11/02/17. Had expander removed and replaced again on 03/03/19 due to  a small leak, pt had reduction on R breast at this time    Patient Stated Goals  to get strength back and ROM, be able to unhook bra easily    Currently in Pain?  No/denies    Pain Score  0-No pain                       [facility] Adult PT Treatment/Exercise - 04/28/19 0001      Shoulder Exercises: Standing   Other Standing Exercises  wall washing with wash cloth under L hand x 7 reps both directions   stopped due to pain in axilla    Other Standing Exercises  using foam roll under forearms- rolling up wall until stretch was felt in axilla x 10 reps      Shoulder Exercises: Pulleys   Flexion  2 minutes   with v/c to stretch L at end range   ABduction  2 minutes   v/c to stretch at end range     Shoulder Exercises: Therapy Ball   Flexion  Both;10 reps   with stretch at end range   ABduction  Left;10 reps   with stretch at end range     Manual Therapy   Soft tissue mobilization  in supine to L pec muscle and along L lateral trunk in area of tightness     Passive ROM  to L shoulder in direction of abduction with prolonged holds while performing STM                  PT Long Term Goals - 04/26/19 1613      PT LONG TERM GOAL #1   Title  Pt will demonstrate 160 degrees of left shoulder flexion to allow her to reach overhead.    Baseline  137, 04/26/19- 141    Time  4    Period  Weeks    Status  On-going      PT LONG TERM GOAL #2   Title  Pt will demonstrate 160 degrees of left shoulder abduction to allow her to reach out to the side    Baseline  135, 04/26/19- 160    Time  4    Period  Weeks    Status  Achieved      PT LONG TERM GOAL #3   Title  Pt will report an 80% improvement in pain and tightness in L axilla to allow pt to move LUE without discomfort.    Baseline  04/26/19- 60% improved    Time  4    Period  Weeks    Status  On-going      PT LONG TERM GOAL #4   Title  Pt will be independent with a home exercise program for continued  strengthening and stretching    Time  4    Period  Weeks    Status  On-going      PT LONG TERM GOAL #5   Title  Pt will report a 50% improvement in back  pain in area of lat flap when walking to allow improved comfort    Baseline  04/26/19- 0% improvement    Time  4    Period  Weeks    Status  On-going            Plan - 04/28/19 1647    Clinical Impression Statement  Since pt is still feeling increased tightness across left chest performed STM to this area while moving arm in abduction with prolonged holds. Pt did feel more loose at end of session but now feels more tightness along left lateral trunk in area of serratus so will focus on this next. Continued with ROM exercises to stretch L axilla and improve ROM.    Clinical Impairments Affecting Rehab Potential  hx of radiation    PT Frequency  2x / week    PT Duration  4 weeks    PT Treatment/Interventions  ADLs/Self Care Home Management;Therapeutic exercise;Patient/family education;Manual techniques;Manual lymph drainage;Compression bandaging;Scar mobilization;Passive range of motion;Therapeutic activities    PT Next Visit Plan  STM to left lateral trunk, see if sleeve came,  continue PROM to L shoulder, STM to L pec,  AAROM exercises, teach Strength ABC before DC    PT Home Exercise Plan  Access Code: [email or web address], supine over foam roll, rockwood for LUE, wall washing    Consulted and Agree with Plan of Care  Patient       Patient will benefit from skilled therapeutic intervention in order to improve the following deficits and impairments:  Decreased range of motion, Impaired UE functional use, Decreased strength, Other (comment), Pain, Postural dysfunction  Visit Diagnosis: Stiffness of left shoulder, not elsewhere classified  Aftercare following surgery for neoplasm  Acute pain of left shoulder     Problem List Patient Active Problem List   Diagnosis Date Noted  . Benign essential HTN 08/17/2018  . Morbid obesity with  body mass index (BMI) of 40.0 to 49.9 ([place]) 09/17/2017  . 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 Malignant neoplasm of upper-inner quadrant of left breast in female, estrogen receptor positive ([place]) 07/27/2017  . Genetic testing 03/25/2017  . Malignant 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, estrogen receptor positive ([place]) 01/23/2017  . 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

## 2019-04-28 NOTE — Telephone Encounter (Signed)
[name]: LVM for patient regarding appt tomorrow for research labs. There is a new consent form to review/sign prior to lab appointment. Asked patient if she can come in at least 15 minutes early to sign consent before lab. Also reminded patient to fast for at least 8 hrs prior to this appt and to not take Letrozole until after labs are drawn tomorrow.  Asked patient to return call to confirm. Foye Spurling, BSN, RN Clinical Research Nurse 04/28/2019 11:56 AM

## 2019-04-29 ENCOUNTER — Inpatient Hospital Stay: Payer: 59 | Admitting: *Deleted

## 2019-04-29 ENCOUNTER — Inpatient Hospital Stay: Payer: 59 | Attending: Adult Health

## 2019-04-29 ENCOUNTER — Other Ambulatory Visit: Payer: Self-pay

## 2019-04-29 ENCOUNTER — Telehealth: Payer: Self-pay | Admitting: *Deleted

## 2019-04-29 DIAGNOSIS — Z170 Estrogen receptor positive status [ER+]: Secondary | ICD-10-CM | POA: Insufficient documentation

## 2019-04-29 DIAGNOSIS — Z5111 Encounter for antineoplastic chemotherapy: Secondary | ICD-10-CM | POA: Insufficient documentation

## 2019-04-29 DIAGNOSIS — Z79899 Other long term (current) drug therapy: Secondary | ICD-10-CM | POA: Insufficient documentation

## 2019-04-29 DIAGNOSIS — C50812 Malignant neoplasm of overlapping sites of left female breast: Secondary | ICD-10-CM | POA: Insufficient documentation

## 2019-04-29 DIAGNOSIS — C50212 Malignant neoplasm of upper-inner quadrant of left female breast: Secondary | ICD-10-CM

## 2019-04-29 DIAGNOSIS — Z006 Encounter for examination for normal comparison and control in clinical research program: Secondary | ICD-10-CM | POA: Insufficient documentation

## 2019-04-29 LAB — CBC WITH DIFFERENTIAL (CANCER CENTER ONLY)
Abs Immature Granulocytes: 0.01 10*3/uL (ref 0.00–0.07)
Basophils Absolute: 0 10*3/uL (ref 0.0–0.1)
Basophils Relative: 1 %
Eosinophils Absolute: 0 10*3/uL (ref 0.0–0.5)
Eosinophils Relative: 1 %
HCT: 38 % (ref 36.0–46.0)
Hemoglobin: 12.3 g/dL (ref 12.0–15.0)
Immature Granulocytes: 0 %
Lymphocytes Relative: 29 %
Lymphs Abs: 0.8 10*3/uL (ref 0.7–4.0)
MCH: 28.6 pg (ref 26.0–34.0)
MCHC: 32.4 g/dL (ref 30.0–36.0)
MCV: 88.4 fL (ref 80.0–100.0)
Monocytes Absolute: 0.3 10*3/uL (ref 0.1–1.0)
Monocytes Relative: 10 %
Neutro Abs: 1.7 10*3/uL (ref 1.7–7.7)
Neutrophils Relative %: 59 %
Platelet Count: 223 10*3/uL (ref 150–400)
RBC: 4.3 MIL/uL (ref 3.87–5.11)
RDW: 14.6 % (ref 11.5–15.5)
WBC Count: 2.9 10*3/uL — ABNORMAL LOW (ref 4.0–10.5)
nRBC: 0 % (ref 0.0–0.2)

## 2019-04-29 LAB — MAGNESIUM: Magnesium: 1.9 mg/dL (ref 1.7–2.4)

## 2019-04-29 LAB — CMP (CANCER CENTER ONLY)
ALT: 71 U/L — ABNORMAL HIGH (ref 0–44)
AST: 40 U/L (ref 15–41)
Albumin: 3.8 g/dL (ref 3.5–5.0)
Alkaline Phosphatase: 138 U/L — ABNORMAL HIGH (ref 38–126)
Anion gap: 6 (ref 5–15)
BUN: 10 mg/dL (ref 6–20)
CO2: 27 mmol/L (ref 22–32)
Calcium: 9.3 mg/dL (ref 8.9–10.3)
Chloride: 104 mmol/L (ref 98–111)
Creatinine: 0.9 mg/dL (ref 0.44–1.00)
GFR, Est AFR Am: >60 mL/min (ref >60)
GFR, Estimated: >60 mL/min (ref >60)
Glucose, Bld: 113 mg/dL — ABNORMAL HIGH (ref 70–99)
Potassium: 3.8 mmol/L (ref 3.5–5.1)
Sodium: 137 mmol/L (ref 135–145)
Total Bilirubin: 0.4 mg/dL (ref 0.3–1.2)
Total Protein: 7.5 g/dL (ref 6.5–8.1)

## 2019-04-29 LAB — PHOSPHORUS: Phosphorus: 3.3 mg/dL (ref 2.5–4.6)

## 2019-04-29 LAB — LIPASE, BLOOD: Lipase: 20 U/L (ref 11–51)

## 2019-04-29 LAB — BILIRUBIN, DIRECT: Bilirubin, Direct: 0.1 mg/dL (ref 0.0–0.2)

## 2019-04-29 LAB — AMYLASE: Amylase: 82 U/L (ref 28–100)

## 2019-04-29 LAB — URIC ACID: Uric Acid, Serum: 4.1 mg/dL (ref 2.5–7.1)

## 2019-04-29 LAB — RESEARCH LABS

## 2019-04-29 LAB — LACTATE DEHYDROGENASE: LDH: 188 U/L (ref 98–192)

## 2019-04-29 LAB — GAMMA GT: GGT: 25 U/L (ref 7–50)

## 2019-04-29 NOTE — Telephone Encounter (Signed)
[name]: Called patient with lab results today. Reminded patient to bring study drug bottles and letrozole with her to clinic on Monday. Do not take Letrozole at home as she will need to take in clinic with Ribociclib. Also reminded her to bring medication diaries to visit. Patient verbalized understanding.  Foye Spurling, BSN, RN Clinical Research Nurse 04/29/2019 2:21 PM

## 2019-04-29 NOTE — Research (Signed)
04/29/2019 Research - [employer] TRIO033/[name] Patient into clinic unaccompanied this morning for evaluation prior to treatment Cycle 13 scheduled to begin on Monday, 05/02/2019.  Reconsent - see separate note regarding study reconsent.  Patient Reported Outcomes (PROs) - Upon arrival to the clinic, PRO questionnaires (4) were completed by the patient independently.   Labs - patient had labs collected while in a fasting state. Last food intake was yesterday evening with around 8 or 9pm.  Cindy S. Brigitte Pulse BSN, RN, CCRP 04/29/2019 9:35 AM

## 2019-04-29 NOTE — Research (Signed)
04/29/2019 Research - [employer] TRIO033/[name] - Reconsent Protocol Version 4.1 During clinic visit today, changes to the [id number] Informed Consent Form (PICF) version 4.1 dated 03-Feb-2019 were reviewed with the patient.  All of the patient's questions were answered and she had no further concerns. She agrees to continued participation in the main study and to continued participation in the optional portions of the study as well. The Revised Patient Informed Consent Form (PICF) was signed and dated by the patient. She understands that she is free to withdraw her consent for the main study and optional portions at any time. Patient was given a blank copy of the ICF to take home and will be given a copy of the signed ICF when she returns on Monday. Cindy S. Brigitte Pulse BSN, RN, Story 04/29/2019 9:39 AM

## 2019-05-02 ENCOUNTER — Encounter: Payer: Self-pay | Admitting: *Deleted

## 2019-05-02 ENCOUNTER — Encounter: Payer: Self-pay | Admitting: Adult Health

## 2019-05-02 ENCOUNTER — Other Ambulatory Visit: Payer: Self-pay | Admitting: Adult Health

## 2019-05-02 ENCOUNTER — Inpatient Hospital Stay (HOSPITAL_BASED_OUTPATIENT_CLINIC_OR_DEPARTMENT_OTHER): Payer: 59 | Admitting: Adult Health

## 2019-05-02 ENCOUNTER — Encounter: Payer: Self-pay | Admitting: Physical Therapy

## 2019-05-02 ENCOUNTER — Encounter: Payer: 59 | Admitting: Nutrition

## 2019-05-02 ENCOUNTER — Telehealth: Payer: Self-pay | Admitting: Adult Health

## 2019-05-02 ENCOUNTER — Inpatient Hospital Stay: Payer: 59

## 2019-05-02 ENCOUNTER — Ambulatory Visit: Payer: 59 | Admitting: Physical Therapy

## 2019-05-02 ENCOUNTER — Other Ambulatory Visit: Payer: Self-pay

## 2019-05-02 VITALS — BP 136/90 | HR 81 | Temp 98.2°F | Resp 18 | Ht 66.0 in | Wt 268.5 lb

## 2019-05-02 DIAGNOSIS — C50812 Malignant neoplasm of overlapping sites of left female breast: Secondary | ICD-10-CM

## 2019-05-02 DIAGNOSIS — C773 Secondary and unspecified malignant neoplasm of axilla and upper limb lymph nodes: Secondary | ICD-10-CM

## 2019-05-02 DIAGNOSIS — C50212 Malignant neoplasm of upper-inner quadrant of left female breast: Secondary | ICD-10-CM

## 2019-05-02 DIAGNOSIS — Z79899 Other long term (current) drug therapy: Secondary | ICD-10-CM | POA: Diagnosis not present

## 2019-05-02 DIAGNOSIS — Z5111 Encounter for antineoplastic chemotherapy: Secondary | ICD-10-CM | POA: Diagnosis not present

## 2019-05-02 DIAGNOSIS — Z006 Encounter for examination for normal comparison and control in clinical research program: Secondary | ICD-10-CM | POA: Diagnosis not present

## 2019-05-02 DIAGNOSIS — Z170 Estrogen receptor positive status [ER+]: Secondary | ICD-10-CM | POA: Diagnosis not present

## 2019-05-02 DIAGNOSIS — M25512 Pain in left shoulder: Secondary | ICD-10-CM

## 2019-05-02 DIAGNOSIS — Z483 Aftercare following surgery for neoplasm: Secondary | ICD-10-CM

## 2019-05-02 DIAGNOSIS — R7309 Other abnormal glucose: Secondary | ICD-10-CM

## 2019-05-02 DIAGNOSIS — M6281 Muscle weakness (generalized): Secondary | ICD-10-CM

## 2019-05-02 DIAGNOSIS — C50912 Malignant neoplasm of unspecified site of left female breast: Secondary | ICD-10-CM

## 2019-05-02 DIAGNOSIS — M25612 Stiffness of left shoulder, not elsewhere classified: Secondary | ICD-10-CM

## 2019-05-02 MED ORDER — GOSERELIN ACETATE 3.6 MG ~~LOC~~ IMPL
DRUG_IMPLANT | SUBCUTANEOUS | Status: AC
  Filled 2019-05-02: qty 3.6

## 2019-05-02 MED ORDER — GOSERELIN ACETATE 3.6 MG ~~LOC~~ IMPL
3.6000 mg | DRUG_IMPLANT | Freq: Once | SUBCUTANEOUS | Status: AC
  Administered 2019-05-02: 3.6 mg via SUBCUTANEOUS

## 2019-05-02 NOTE — Telephone Encounter (Signed)
I left a message regarding schedule  

## 2019-05-02 NOTE — Patient Instructions (Addendum)
Flexibility: Corner Stretch    Standing in corner with hands just above shoulder level and feet _10-12___ inches from corner, lean forward until a comfortable stretch is felt across chest. Hold _30-60___ seconds. Repeat _5___ times per set. Do ___1_ sets per session. Do _2___ sessions per day.  http://orth.exer.us/343   Copyright  VHI. All rights reserved.   3 Way Raises:      Starting Position:  Leaning against wall, walk feet a few inches away from the wall and make tummy tight (tuck hips underneath you) Press back/shoulders/head against wall as much as possible. Keep thumbs up to ceiling, elbows straight and shoulders relaxed/down throughout.  1. Lift arms in front to shoulder height 2. Lift arms a little wider into a "V" to shoulder height 3. Lift arms out to sides in a "T" to shoulder height  Perform 10 times in each direction. Hold 1-2 lbs to start with and work up to 2-3 sets of 10/day. Perform 3-4 times/week. Increase weight as able, decreasing sets of 10 each time you increase weights, then slowly working your way back up to 2-3 sets each time.    Cancer Rehab [phone]

## 2019-05-02 NOTE — Therapy (Signed)
[place], [place], [address] Phone: [phone]   Fax:  [phone]  Physical Therapy Treatment  Patient Details  Name: Mackenzie [name] MRN: [id number] Date of Birth: [birth year] Referring Provider (PT): Dillingham   Encounter Date: 05/02/2019  PT End of Session - 05/02/19 1547    Visit Number  11    Number of Visits  17    Date for PT Re-Evaluation  05/24/19    PT Start Time  1501    PT Stop Time  1546    PT Time Calculation (min)  45 min    Activity Tolerance  Patient tolerated treatment well    Behavior During Therapy  [facility] for tasks assessed/performed       Past Medical History:  Diagnosis Date  . Abnormal glucose 2018  . 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 Allergic rhinitis 2012  . Anemia 01/27/2017   prior to starting chemotherapy  . Breast cancer ([place]) 01/14/2017   Left breast  . 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 Hot flashes 03/2017  . Hyperlipidemia 03/20/2016  . Morbid obesity with body mass index (BMI) of 40.0 to 49.9 ([place]) 09/17/2017  . NCGS (non-celiac gluten sensitivity)   . Pre-diabetes   . Seasonal allergies 2012   seasonal allergies causes allergic rhinitis and itchy, dry eyes per pt  . Vitamin D deficiency 05/2015    Past Surgical History:  Procedure Laterality Date  . BREAST RECONSTRUCTION WITH PLACEMENT OF TISSUE EXPANDER AND FLEX HD (ACELLULAR HYDRATED DERMIS) Left 08/27/2017   Procedure: LEFT BREAST RECONSTRUCTION WITH PLACEMENT OF TISSUE EXPANDER AND FLEX HD;  Surgeon: Wallace Going, DO;  Location: [place];  Service: Plastics;  Laterality: Left;  . CESAREAN SECTION     x2  . LATISSIMUS FLAP TO BREAST Left 11/03/2018  . LATISSIMUS FLAP TO BREAST Left 11/03/2018   Procedure: LATISSIMUS FLAP TO LEFT BREAST;  Surgeon: Wallace Going, DO;  Location: [place];  Service: Plastics;  Laterality: Left;  Marland Kitchen  MASTECTOMY MODIFIED RADICAL Left 08/27/2017  . MASTECTOMY MODIFIED RADICAL Left 08/27/2017   Procedure: LEFT MODIFIED RADICAL MASTECTOMY;  Surgeon: Erroll Luna, MD;  Location: [place];  Service: General;  Laterality: Left;  Marland Kitchen MASTOPEXY Right 03/03/2019   Procedure: RIGHT BREAST MASTOPEXY/REDUCTION;  Surgeon: Wallace Going, DO;  Location: [place];  Service: Plastics;  Laterality: Right;  3 hours, please  . PORT-A-CATH REMOVAL Right 08/27/2017   Procedure: REMOVAL PORT-A-CATH RIGHT CHEST;  Surgeon: Erroll Luna, MD;  Location: [place];  Service: General;  Laterality: Right;  . PORTACATH PLACEMENT Right 01/28/2017   Procedure: INSERTION PORT-A-CATH WITH ULTRASOUND;  Surgeon: Erroll Luna, MD;  Location: [place];  Service: General;  Laterality: Right;  . REMOVAL OF TISSUE EXPANDER AND PLACEMENT OF IMPLANT Left 03/03/2019   Procedure: REMOVAL OF TISSUE EXPANDER AND PLACEMENT OF EXPANDER;  Surgeon: Wallace Going, DO;  Location: [place];  Service: Plastics;  Laterality: Left;  . TISSUE EXPANDER PLACEMENT Left 11/03/2018   Procedure: PLACEMENT OF TISSUE EXPANDER LEFT BREAST;  Surgeon: Wallace Going, DO;  Location: [place];  Service: Plastics;  Laterality: Left;  Total case time is 3.5 hours  . TUBAL LIGATION Bilateral 01/21/2011    There were no vitals filed for this visit.  Subjective Assessment - 05/02/19 1504    Subjective  I have not been taking my neurontin because I misplaced  it. I am feeling tight still.    Pertinent History  Left breast cancer diagnosed 01/10/17 by mammogram and needle biopsy. Then had neo-adjuvant chemo starting 02/10/17 until 06/26/17; had left mastectomy 08/27/17 with ALND and immediate expander placed. Radiation completed. Expander removed and replaced on May 27, 19 b/c it did not get expanded enough prior to radiation, also had lat flap 11/02/17. Had expander removed and replaced again on 03/03/19 due to a  small leak, pt had reduction on R breast at this time    Patient Stated Goals  to get strength back and ROM, be able to unhook bra easily    Currently in Pain?  No/denies    Pain Score  0-No pain                       [facility] Adult PT Treatment/Exercise - 05/02/19 0001      Shoulder Exercises: Standing   Other Standing Exercises  3 way shoulder with 2 lb weights x 10 reps with pt returning therapist demo      Shoulder Exercises: Pulleys   Flexion  2 minutes   with v/c to stretch L at end range   ABduction  2 minutes   v/c to stretch at end range     Shoulder Exercises: Therapy Ball   Flexion  Both;10 reps   with stretch at end range   ABduction  Left;10 reps   with stretch at end range     Shoulder Exercises: Stretch   Other Shoulder Stretches  corner stretch x 1 rep with 30 sec hold      Manual Therapy   Soft tissue mobilization  in supine to L pec muscle and along L lateral trunk in area of tightness     Passive ROM  to L shoulder in direction of abduction and flexion with prolonged holds while performing STM                  PT Long Term Goals - 04/26/19 1613      PT LONG TERM GOAL #1   Title  Pt will demonstrate 160 degrees of left shoulder flexion to allow her to reach overhead.    Baseline  137, 04/26/19- 141    Time  4    Period  Weeks    Status  On-going      PT LONG TERM GOAL #2   Title  Pt will demonstrate 160 degrees of left shoulder abduction to allow her to reach out to the side    Baseline  135, 04/26/19- 160    Time  4    Period  Weeks    Status  Achieved      PT LONG TERM GOAL #3   Title  Pt will report an 80% improvement in pain and tightness in L axilla to allow pt to move LUE without discomfort.    Baseline  04/26/19- 60% improved    Time  4    Period  Weeks    Status  On-going      PT LONG TERM GOAL #4   Title  Pt will be independent with a home exercise program for continued strengthening and stretching    Time  4     Period  Weeks    Status  On-going      PT LONG TERM GOAL #5   Title  Pt will report a 50% improvement in back pain in area of lat flap when walking  to allow improved comfort    Baseline  04/26/19- 0% improvement    Time  4    Period  Weeks    Status  On-going            Plan - 05/02/19 1549    Clinical Impression Statement  Instructed pt in 3 way shoulder raises today for shoulder strengthening. Also educated pt in corner stretch to help decrease pec tightness and instructed pt to do these throughout the day. Continued with manual therapy including prolonged holds in to flexion and abduction and soft tissue mobilization to left pec and inferior axilla.    PT Frequency  2x / week    PT Duration  4 weeks    PT Treatment/Interventions  ADLs/Self Care Home Management;Therapeutic exercise;Patient/family education;Manual techniques;Manual lymph drainage;Compression bandaging;Scar mobilization;Passive range of motion;Therapeutic activities    PT Next Visit Plan  see how corner stretch is going, STM to left lateral trunk, see if sleeve came,  continue PROM to L shoulder, STM to L pec,  AAROM exercises, teach Strength ABC before DC    PT Home Exercise Plan  Access Code: [email or web address], supine over foam roll, rockwood for LUE, wall washing, 3 way raises, corner stretch    Consulted and Agree with Plan of Care  Patient       Patient will benefit from skilled therapeutic intervention in order to improve the following deficits and impairments:  Decreased range of motion, Impaired UE functional use, Decreased strength, Other (comment), Pain, Postural dysfunction  Visit Diagnosis: Stiffness of left shoulder, not elsewhere classified  Aftercare following surgery for neoplasm  Acute pain of left shoulder  Muscle weakness (generalized)     Problem List Patient Active Problem List   Diagnosis Date Noted  . Benign essential HTN 08/17/2018  . Morbid obesity with body mass index (BMI) of 40.0 to  49.9 ([place]) 09/17/2017  . 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 Malignant neoplasm of upper-inner quadrant of left breast in female, estrogen receptor positive ([place]) 07/27/2017  . Genetic testing 03/25/2017  . Malignant 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, estrogen receptor positive ([place]) 01/23/2017  . 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

## 2019-05-02 NOTE — Progress Notes (Signed)
[place]  [phone] Fax:([phone]) [phone]    ID: Mackenzie [name] DOB: 11-12-1970  MR#: [id number]  IRW#:[id number]  Patient Care Team: Willey Blade, MD as PCP - General (Internal Medicine) Magrinat, [name], MD as Consulting Physician (Oncology) Erroll Luna, MD as Consulting Physician (General Surgery) Kyung Rudd, MD as Consulting Physician (Radiation Oncology) Dillingham, Loel Lofty, DO as Attending Physician (Plastic Surgery) Cathlean Cower, RN as Registered Nurse OTHER MD:    CHIEF COMPLAINT: Estrogen receptor positive breast cancer  CURRENT TREATMENT: letrozole, goserelin; on [name] trial (ribociclib arm)   INTERVAL HISTORY: Mackenzie [name] returns today for follow-up and treatment of her estrogen receptor positive breast cancer.  Mackenzie [name] is on study with [name]--Ribociclb and anti estrogen therapy with Letrozole daily.  She is here for post op assessment after holding the Ribociclib pre op since 9/15.  She underwent breast reconstruction on 9/24, she underwent right breast reduction and left breast expander placement.  Today is cycle 13 day 1 of study.  She restarted the ribociclib on 04/05/2019 as it was held previously due to increased LFTs. She is due for MRI liver, and will do this after her breast expanders are removed.   She continues on letrozole.  She tolerates this well, with no significant hot flashes or vaginal dryness problems.  She also continues on goserelin every 4 weeks. The most recent dose was received on 04/05/2019.  She is due for a dose today.   Mackenzie [name]'s last bone density screening on 03/16/2018, showed a T-score of -0.7, which is considered normal.    REVIEW OF SYSTEMS: [name] is doing well today.  She has mild hot flashes and these are tolerable.  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  She is scheduled  for implant placement on 05/25/2019. She has some tightness at her breasts and occasional pain where the expanders are.    Mackenzie [name] denies any fever, chills, chest pain, palpitations, cough, shortness of breath, bowel/bladder changes, nausea, vomiting, or any other issues.  A detailed ROS was otherwise non contributory.    HISTORY OF CURRENT ILLNESS: From the original intake note:  The patient herself noted some changes in her left breast late July 2018, she says, and eventually brought this to medical attention so that on 01/09/2017 she underwent bilateral diagnostic mammography with tomography and left breast ultrasonography at the breast Center. This found the breast density to be category C. In the upper inner quadrant of the left breast there was an area of asymmetry and there were malignant type calcifications involving all 4 quadrants. On exam there is firmness and palpable thickening in the anterior left breast with skin dimpling. Ultrasonography found at the 9:30 o'clock radiant 3 cm from the nipple a 2.7 cm mass and in the left axilla for abnormal lymph nodes largest of which measured 2.7 cm.  Biopsy of the left breast 9:30 o'clock mass 80 01/26/2017 showed ([id number]) invasive ductal carcinoma, with extracellular mucin, grade 1 or 2. In the lower outer left breast is separated biopsy the same day showed ductal carcinoma in situ. One of the 4 lymph nodes involved was positive for metastatic carcinoma. Prognostic panel on the invasive disease showed it to be estrogen receptor 100% positive, progesterone receptor 20% positive, both with strong staining intensity, with an MIB-1 of 20%, and no HER-2 amplification with a signals ratio 1.38  and the number per cell 2.90.  On 01/22/2017 the patient underwent bilateral breast MRI. This showed no involvement of the right breast, but in the left breast there was a masslike and non-masslike enhancement involving all quadrants, with skin swelling but no  abnormal enhancement of the skin or nipple areolar complex or pectoralis muscle. The mass could not be clearly measured but spanned approximately 12 cm. There was bulky left axillary lymphadenopathy, with the largest lymph node measuring up to 3.4 cm.  The patient's subsequent history is as detailed below.   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, left ([facility]) 01/23/2017   Hot flashes 03/2017   Hyperlipidemia 03/20/2016   Morbid obesity with body mass index (BMI) of 40.0 to 49.9 (HCC) 09/17/2017   NCGS (non-celiac gluten sensitivity)    Pre-diabetes    Seasonal allergies 2012   seasonal allergies causes allergic rhinitis and itchy, dry eyes per pt   Vitamin D deficiency 05/2015    PAST SURGICAL HISTORY: Past Surgical History:  Procedure Laterality Date   BREAST RECONSTRUCTION WITH PLACEMENT OF TISSUE EXPANDER AND FLEX HD (ACELLULAR HYDRATED DERMIS) Left 08/27/2017   Procedure: LEFT BREAST RECONSTRUCTION WITH PLACEMENT OF TISSUE EXPANDER AND FLEX HD;  Surgeon: Wallace Going, DO;  Location: [facility];  Service: Plastics;  Laterality: Left;   CESAREAN SECTION     x2   LATISSIMUS FLAP TO BREAST Left 11/03/2018   LATISSIMUS FLAP TO BREAST Left 11/03/2018   Procedure: LATISSIMUS FLAP TO LEFT BREAST;  Surgeon: Wallace Going, DO;  Location: [facility];  Service: Plastics;  Laterality: Left;   MASTECTOMY MODIFIED RADICAL Left 08/27/2017   MASTECTOMY MODIFIED RADICAL Left 08/27/2017   Procedure: LEFT MODIFIED RADICAL MASTECTOMY;  Surgeon: Erroll Luna, MD;  Location: [facility];  Service: General;  Laterality: Left;   MASTOPEXY Right 03/03/2019   Procedure: RIGHT BREAST MASTOPEXY/REDUCTION;  Surgeon:  Wallace Going, DO;  Location: [facility];  Service: Plastics;  Laterality: Right;  3 hours, please   PORT-A-CATH REMOVAL Right 08/27/2017   Procedure: REMOVAL PORT-A-CATH RIGHT CHEST;  Surgeon: Erroll Luna, MD;  Location: [facility];  Service: General;  Laterality: Right;   PORTACATH PLACEMENT Right 01/28/2017   Procedure: INSERTION PORT-A-CATH WITH ULTRASOUND;  Surgeon: Erroll Luna, MD;  Location: [facility];  Service: General;  Laterality: Right;   REMOVAL OF TISSUE EXPANDER AND PLACEMENT OF IMPLANT Left 03/03/2019   Procedure: REMOVAL OF TISSUE EXPANDER AND PLACEMENT OF EXPANDER;  Surgeon: Wallace Going, DO;  Location: [facility];  Service: Plastics;  Laterality: Left;   TISSUE EXPANDER PLACEMENT Left 11/03/2018   Procedure: PLACEMENT OF TISSUE EXPANDER LEFT BREAST;  Surgeon: Wallace Going, DO;  Location: [facility];  Service: Plastics;  Laterality: Left;  Total case time is 3.5 hours   TUBAL LIGATION Bilateral 01/21/2011    FAMILY HISTORY Family History  Problem Relation Age of Onset   Breast cancer Paternal Grandmother 35       d.60s from breast cancer. Did not have treatment.   Other Mother        K.93 from complications of surgery to remove brain tumor   The patient's father still alive at age  74. The patient's mother died with a brain tumor which the patient says was "benign". She was 56. The patient has one brother, no sisters. The only breast cancer in the family is a paternal grandmother who died from breast cancer at an unknown age. There is no history of ovarian or prostate cancer in the family.   GYNECOLOGIC HISTORY:  No LMP recorded. (Menstrual status: Chemotherapy). Menarche age 14 and first live birth age 89 she is [place] P4. The patient is still having regular periods as of August 2018   SOCIAL HISTORY: (Updated 05/31/2018) Pamlea works at [employer] on the fifth floor, [place]. She is separated from her  husband (as of 05/2018), [name], who is a Building control surveyor. Daughter is married and lives in [place] and works as a Education administrator. Summar's daughter, [name], lives in [place] and works in the police department. Daughters, [place] and [place] are 79 and 6, living at home. The patient has a grandson she looks after. She attends a [employer].     ADVANCED DIRECTIVES: Not in place   HEALTH MAINTENANCE: Social History   Tobacco Use   Smoking status: Never Smoker   Smokeless tobacco: Never Used  Substance Use Topics   Alcohol use: Yes    Comment: social   Drug use: No    Colonoscopy: Never  PAP: December 2017  Bone density: Never   Allergies  Allergen Reactions   Dilaudid [Hydromorphone] Itching    Current Outpatient Medications  Medication Sig Dispense Refill   acetaminophen (TYLENOL) 500 MG tablet Take 1,000 mg by mouth every 6 (six) hours as needed (for pain/headaches.).      B Complex Vitamins (VITAMIN B COMPLEX PO) Take 1 capsule by mouth 2 (two) times daily.      budesonide-formoterol (SYMBICORT) 80-4.5 MCG/ACT inhaler Inhale 2 puffs into the lungs every morning.      Calcium Carb-Cholecalciferol (CALCIUM 600+D3 PO) Take 1 tablet by mouth 2 (two) times daily.      cetirizine (ZYRTEC) 10 MG tablet Take 10 mg by mouth daily.      Cholecalciferol (VITAMIN D-3) 5000 units TABS Take 5,000 Units by mouth 2 (two) times daily.      gabapentin (NEURONTIN) 300 MG capsule Take 1 capsule (300 mg total) by mouth at bedtime. (Patient taking differently: Take 300 mg by mouth every other day. ) 90 capsule 4   HYDROcodone-acetaminophen (NORCO) 5-325 MG tablet Take 1 tablet by mouth every 6 (six) hours as needed for moderate pain. 30 tablet 0   hydroxypropyl methylcellulose / hypromellose (ISOPTO TEARS / GONIOVISC) 2.5 % ophthalmic solution Place 1 drop into both eyes daily as needed for dry eyes.     ibuprofen (ADVIL,MOTRIN) 200 MG tablet Take 800 mg by mouth every  8 (eight) hours as needed for mild pain (for pain.).      Investigational ribociclib (KISQALI) 200 MG tablet [name] Take 2 tablets (400 mg total) by mouth daily. Take 2 tablets ('400mg'$  total) by mouth daily on days 1-21.  Repeat every 28 days. 225 tablet 0   letrozole (FEMARA) 2.5 MG tablet Take 1 tablet (2.5 mg total) by mouth daily. 90 tablet 4   loratadine (CLARITIN) 10 MG tablet Take 1 tablet (10 mg total) by mouth daily. (Patient taking differently: Take 10 mg by mouth daily as needed for allergies. ) 90 tablet 2   Multiple Vitamin (MULTIVITAMIN WITH MINERALS) TABS tablet Take 1 tablet by mouth daily.      Omega-3 Fatty Acids (FISH OIL)  1000 MG CAPS Take 1,000 mg by mouth 2 (two) times daily.      phenylephrine (SUDAFED PE) 10 MG TABS tablet Take 10 mg by mouth every 4 (four) hours as needed.     Probiotic Product (PROBIOTIC DAILY PO) Take 1 capsule by mouth 2 (two) times daily.      Turmeric 450 MG CAPS Take 450 mg by mouth 2 (two) times daily.     ZINC OXIDE PO Take 1 tablet by mouth 2 (two) times a day.     No current facility-administered medications for this visit.    Facility-Administered Medications Ordered in Other Visits  Medication Dose Route Frequency Provider Last Rate Last Dose   sodium chloride flush (NS) 0.9 % injection 10 mL  10 mL Intravenous PRN Magrinat, [name], MD   10 mL at 03/10/17 1239    OBJECTIVE:  Vitals:   05/02/19 0935  BP: 136/90  Pulse: 81  Resp: 18  Temp: 98.2 F (36.8 C)  SpO2: 100%   Wt Readings from Last 3 Encounters:  05/02/19 268 lb 8 oz (121.8 kg)  04/22/19 270 lb 3.2 oz (122.6 kg)  04/08/19 269 lb (122 kg)   Body mass index is 43.34 kg/m.    ECOG FS: 1 GENERAL: Patient is a well appearing female in no acute distress HEENT:  Sclerae anicteric.  Oropharynx clear and moist. No ulcerations or evidence of oropharyngeal candidiasis. Neck is supple.  NODES:  No cervical, supraclavicular, or axillary lymphadenopathy palpated.   BREAST EXAM:  Right breast s/p reduction, healing well, left breast s/p radiation, expander in place, healing well, hyperpigmentation noted from radiation LUNGS:  Clear to auscultation bilaterally.  No wheezes or rhonchi. HEART:  Regular rate and rhythm. No murmur appreciated. ABDOMEN:  Soft, nontender.  Positive, normoactive bowel sounds. No organomegaly palpated. MSK:  No focal spinal tenderness to palpation. Full range of motion bilaterally in the upper extremities. EXTREMITIES:  No peripheral edema.   SKIN:  Clear with no obvious rashes or skin changes. No nail dyscrasia. NEURO:  Nonfocal. Well oriented.  Appropriate affect.      LAB RESULTS:  No visits with results within 3 Day(s) from this visit.  Latest known visit with results is:  Appointment on 04/29/2019  Component Date Value Ref Range Status   Uric Acid, Serum 04/29/2019 4.1  2.5 - 7.1 mg/dL Final   Performed at [facility], [address] [place], [place] [address]   Phosphorus 04/29/2019 3.3  2.5 - 4.6 mg/dL Final   Performed at [place] [address] [place], [place] [address]   Magnesium 04/29/2019 1.9  1.7 - 2.4 mg/dL Final   Performed at [facility], [place] [address], [place], [place] [address]   Lipase 04/29/2019 20  11 - 51 U/L Final   Performed at [facility], [place] [address], [place],  [address]   LDH 04/29/2019 188  98 - 192 U/L Final   Performed at [facility], [place] [address] [place], [place] [address]   GGT 04/29/2019 25  7 - 50 U/L Final   Performed at [facility], [place] [address], [place], [place] [address]   Sodium 04/29/2019 137  135 - 145 mmol/L Final   Potassium 04/29/2019 3.8  3.5 - 5.1 mmol/L Final   Chloride 04/29/2019 104  98 - 111 mmol/L Final   CO2 04/29/2019 27  22 - 32 mmol/L Final  Glucose, Bld 04/29/2019 113* 70 - 99 mg/dL Final     BUN 04/29/2019 10  6 - 20 mg/dL Final   Creatinine 04/29/2019 0.90  0.44 - 1.00 mg/dL Final   Calcium 04/29/2019 9.3  8.9 - 10.3 mg/dL Final   Total Protein 04/29/2019 7.5  6.5 - 8.1 g/dL Final   Albumin 04/29/2019 3.8  3.5 - 5.0 g/dL Final   AST 04/29/2019 40  15 - 41 U/L Final   ALT 04/29/2019 71* 0 - 44 U/L Final   Alkaline Phosphatase 04/29/2019 138* 38 - 126 U/L Final   Total Bilirubin 04/29/2019 0.4  0.3 - 1.2 mg/dL Final   GFR, Est Non Af Am 04/29/2019 >60  >60 mL/min Final   GFR, Est AFR Am 04/29/2019 >60  >60 mL/min Final   Anion gap 04/29/2019 6  5 - 15 Final   Performed at [facility], [place] [address] [place], [place] [address]   WBC Count 04/29/2019 2.9* 4.0 - 10.5 K/uL Final   RBC 04/29/2019 4.30  3.87 - 5.11 MIL/uL Final   Hemoglobin 04/29/2019 12.3  12.0 - 15.0 g/dL Final   HCT 04/29/2019 38.0  36.0 - 46.0 % Final   MCV 04/29/2019 88.4  80.0 - 100.0 fL Final   MCH 04/29/2019 28.6  26.0 - 34.0 pg Final   MCHC 04/29/2019 32.4  30.0 - 36.0 g/dL Final   RDW 04/29/2019 14.6  11.5 - 15.5 % Final   Platelet Count 04/29/2019 223  150 - 400 K/uL Final   nRBC 04/29/2019 0.0  0.0 - 0.2 % Final   Neutrophils Relative % 04/29/2019 59  % Final   Neutro Abs 04/29/2019 1.7  1.7 - 7.7 K/uL Final   Lymphocytes Relative 04/29/2019 29  % Final   Lymphs Abs 04/29/2019 0.8  0.7 - 4.0 K/uL Final   Monocytes Relative 04/29/2019 10  % Final   Monocytes Absolute 04/29/2019 0.3  0.1 - 1.0 K/uL Final   Eosinophils Relative 04/29/2019 1  % Final   Eosinophils Absolute 04/29/2019 0.0  0.0 - 0.5 K/uL Final   Basophils Relative 04/29/2019 1  % Final   Basophils Absolute 04/29/2019 0.0  0.0 - 0.1 K/uL Final   Immature Granulocytes 04/29/2019 0  % Final   Abs Immature Granulocytes 04/29/2019 0.01  0.00 - 0.07 K/uL Final   Performed at [facility], [place] [address] [place], [place] [address]   Bilirubin, Direct  04/29/2019 0.1  0.0 - 0.2 mg/dL Final   Performed at [place] [address] [place], [place] [address]   Amylase 04/29/2019 82  28 - 100 U/L Final   Performed at [facility], [place] [address] [place], [place] [address]   Research Labs 04/29/2019 Collected by Laboratory   Final   Performed at [facility], [place] [address] [place], [place] [address]     STUDIES: US Abdomen Limited  Result Date: 04/04/2019 CLINICAL DATA:  Upper abdominal pain and elevated liver enzymes. History of breast carcinoma EXAM: ULTRASOUND ABDOMEN LIMITED RIGHT UPPER QUADRANT COMPARISON:  None. FINDINGS: Gallbladder: Within the gallbladder, there are echogenic foci which move and shadow consistent with cholelithiasis. Largest gallstone measures 1.0 cm in length. There is no appreciable gallbladder wall thickening or pericholecystic fluid. No sonographic Murphy sign noted by sonographer. Common bile duct: Diameter: 6 mm. No intrahepatic or extrahepatic biliary duct dilatation. Liver: There is a mildly complex cystic area in the left lobe  of the liver measuring 1.6 x 1.5 x 2.2 cm. The liver has a somewhat inhomogeneous echotexture. There is no disruption of hepatic architecture. Portal vein is patent on color Doppler imaging with normal direction of blood flow towards the liver. Other: None. IMPRESSION: 1. Cholelithiasis. No gallbladder wall thickening or pericholecystic fluid. 2. Somewhat complex cystic area in the left lobe of the liver measuring 1.6 x 1.5 x 2.2 cm. Note that the liver has a somewhat inhomogeneous echotexture which may be indicative of fatty infiltration. Infiltrative neoplasm causing this inhomogeneous appearance of the liver is a less likely but possible differential consideration given history of breast carcinoma. Given this circumstance, MR or CT of the liver pre and post-contrast to further evaluate would be reasonable advisable. These  results will be called to the ordering clinician or representative by the Radiologist Assistant, and communication documented in the PACS or zVision Dashboard. Electronically Signed   By: Lowella Grip III M.D.   On: 04/04/2019 10:59     ELIGIBLE FOR AVAILABLE RESEARCH PROTOCOL: [name], ASA study   ASSESSMENT: 48 y.o. [place] woman status post left breast upper inner quadrant biopsy 01/14/2017 for a clinical T3 N2, stage IIA invasive ductal carcinoma, grade 1 or 2, estrogen and progesterone receptor positive, HER-2 nonamplified, with an MIB-1 of 20%.  (1) staging studies: Brain MRI, bone scan, and CT scan of the chest 02/05/2017 showed no brain lesions, no lung or liver lesions, a 4.9 cm mass in the left breast with left axillary and subpectoral adenopathy, and nonspecific bone scan tracer at L2, left scapula, and anterior ribs, with lumbar spine MRI suggested for further evaluation.  (a) lumbar spine MRI 02/17/2017 showed no normal bone lesions.  There was mild lumbar spondylosis  (2) neoadjuvant chemotherapy consisting of cyclophosphamide and doxorubicin in dose dense fashion 4 started 02/10/2017, completed 03/24/2017, followed by weekly carboplatin and gemcitabine given days 1 and 8 of each 21-day cycle starting 04/14/2017, completing the planned 4 cycles 06/26/2017  (3) is post left modified radical mastectomy on 08/27/2017 showing an mpT3 pN2 residual invasive ductal carcinoma, grade 2, with a residual cancer burden of 3.  Margins were clear  (a) latissimus flap reconstruction with expander 11/03/2018  (4) postmastectomy radiation completed 01/01/2018  (a) capecitabine radiosensitization 11/16/2017-01/01/2018  (5) goserelin started 09/21/2017  (a) letrozole started 01/11/2018  (b) enrolled in [place] clinical trial, randomized to ribociclib on 05/31/2018  (c) Bone density on 03/16/2018 was normal with T score of -0.7 in the L1-L4 spine   (6) genetics testing 03/24/2017 through  the Common Hereditary Cancer Panel offered by [facility] found no deleterious mutations in APC, ATM, AXIN2, BARD1, BMPR1A, BRCA1, BRCA2, BRIP1, CDH1, CDKN2A (p14ARF), CDKN2A (p16INK4a), CHEK2, CTNNA1, DICER1, EPCAM (Deletion/duplication testing only), GREM1 (promoter region deletion/duplication testing only), KIT, MEN1, MLH1, MSH2, MSH3, MSH6, MUTYH, NBN, NF1, NHTL1, PALB2, PDGFRA, PMS2, POLD1, POLE, PTEN, RAD50, RAD51C, RAD51D, SDHB, SDHC, SDHD, SMAD4, SMARCA4. STK11, TP53, TSC1, TSC2, and VHL.  The following genes were evaluated for sequence changes only: SDHA and HOXB13 c.251G>A variant only.    (7) Right breast reduction and left breast expander placement on 03/03/2019   PLAN: Seeley is doing well today.  Her labs are stable today.  She continues on [name] trial with no major side effects.  Today is cycle 13 day 1 of her Ribociclib.  I reviewed her status and upcoming surgery with Dr. Jana Hakim on 05/25/2019.  He would like to hold Ribociclib today, and restart on 12/21 when her next cycle is  due.  She will continue taking the Letrozole.    Caila will receive Goserelin today.  She is tolerating it well and managing her mild hot flashes.    Zaniah and I reviewed her referrals.  I have asked my nurse lori to contact nutrition and diabetes, as well as Dr. Migdalia Dk office to get the ball rolling on these.    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  She understands this.    Caprina will return in 4 weeks for labs, f/u, and Goserelin. She was recommended to continue with the appropriate pandemic precautions. She knows to call for any questions that may arise between now and her next appointment.  We are happy to see her sooner if needed.  A total of (30) minutes of face-to-face time was  spent with this patient with greater than 50% of that time in counseling and care-coordination.   [name], NP  05/02/19 9:54 AM Medical Oncology and Hematology [facility] [address] [place],  [address] Tel. [phone]    Fax. [phone]

## 2019-05-02 NOTE — Patient Instructions (Signed)

## 2019-05-02 NOTE — Research (Signed)
[place] study: Patient into clinic by herself this morning to complete the study activities prior to cycles 13, 14 and 15.   PROs; Completed prior to lab on 04/29/19 .  Labs; Completed on 04/29/19.  Results reviewed by Dr. Jana Hakim and [name]. Results are all within study parameters to continue Ribociclib. Corrected Calcium Formula:  4 g/dL utilized for "normal albumin;" local normal range is 3.5-5 g/dL. Corrected Calcium = (0.8 x [normal albumin - patient's albumin]) + serum calcium Correct Calcium = (0.8 x [4-3.8]) + 9.3  Corrected Calcium = 9.46 Vitals; Vital signs completed after patient rested more than five minutes. Weight without shoes. See VS flowsheet.  Concomitant Medications; Reviewed medication list with patient. She reports taking hydrocodone-acetaminophen prn for breast pain/tightness which started before surgery.  Physical Exam/PS - Please see documentation in today's office visit encounter with [name], NP. Adverse Events;  Reviewed with patient. She denies any new or worsening symptoms since last visit. See AE table below.  Investigational Medication - Completed Cycles 10, 11 and 12 medication diaries were returned by patient.  Cycle 10 Diary-showed Ribociclib taken 02/07/19 thru 02/21/19 as directed with one missed dose. Patient states she forgot to take Ribociclib and Letrozole on this day.  Cycle 11 Diary-showed no doses of Ribociclib taken as directed and no missed doses of Letrozole. Cycle 12 Diary-showed Ribociclib taken 04/05/19 thru 04/24/19 as directed with 2 missed doses. Patient states she forgot to take Ribociclib and Letrozole on these days.  She states she also forgot to take Letrozole on 04/04/19.  Cycles 10, 11 and 12  study medication bottles were returned to pharmacy for accounting by pharmacist Raul Del. Pill count revealed 47  tablets remaining in bottle for cycle 10, 75 tablets (unopened bottle) for cycle 11 and 39 pills left in cycle 13  bottle.  This is the expected pill count for each bottle which corresponds to patient's diaries.  Investigational drug (x 3 bottles, to cover cycles 13 through 15), was dispensed by pharmacist Raul Del today continuing at the full dose of 400 daily. Patient was provided with 3 copies of the Patient Diary for recording medication doses (letrozole and ribociclib), during cycles 13, 14 and 15.  Patient self-administered  letrozole under the supervision of the research nurse. Patient filled in diary dosing for today with time of 11:15 am. Patient did not take any Ribociclib as she was directed by Dr. Denny Levy to hold this cycle for surgery.  Dispensed the bottle and diary for cycle 13 for patient to have at home just in case her surgery gets canceled or rescheduled. Patient understands not to take cycle 13 of Ribociclib and will be instructed on how to proceed if her surgery is canceled or rescheduled. Patient understands that once a bottle of Ribociclib is opened it should not be used after 30 days. Patient was instructed to open a new bottle with each cycle as marked, filling in the date opened, and to retain the unopened and partial bottles for return at her next clinic visit. Plan/Scheduling - Breast Surgery to complete implants for reconstruction scheduled for 05/25/19. Dr. Jana Hakim instructed for patient to hold Cycle 13 for surgery and to be seen back in clinic prior to starting on Cycle 14 on 05/30/19. Patient verbalized understanding of these instructions and she knows to notify research nurse if any changes to her surgery date.  Patient is aware that she should contact the clinic at any time ifanysymptomsworsen orrecur, or if she has  any questions or concerns prior to next visit.  Thanked patient for her ongoing participation in this clnical trial.    AE Table: Event Grade Onset Date End Date Status Comments Attribution to Ribociclib Attribution to Letrozole Attribution to  Goserelin  Allergic Rhinitis 2 2012  ongoing At Baseline n/a n/a n/a  Anemia 1 2018 10/29/18 resolved At Baseline n/a n/a n/a  Constipation 1 08/27/17  ongoing At Baseline n/a n/a n/a  Cough, intermittent 2 05/09/18  ongoing At Baseline n/a n/a n/a  GERD, intermittent 1 2018  ongoing At Baseline n/a n/a n/a  Gluten Sensitivity 1 12/2017  ongoing At Baseline n/a n/a n/a  Headaches 1 02/2017  ongoing At Baseline n/a n/a n/a  Hot Flashes 2 03/2017  ongoing At Baseline n/a n/a n/a  Hyperglycemia 1 2018  ongoing At Baseline n/a n/a n/a  Hyperlipidemia 1 03/2016  ongoing At Baseline n/a n/a n/a  Vitamin D Deficiency 2 05/2015  ongoing At Baseline n/a n/a n/a  Arthralgias bilat legs 1 10/01/18  ongoing At Baseline     Fatigue 1 06/28/18  ongoing   Yes No No  Blurred Vision 1 07/14/18  ongoing  No No No  Anemia 1 11/12/18 03/29/19 resolved Grade 1 at Baseline No No No  Hypertension  1-3 2012  ongoing At Baseline. Intermittent grade 1 - 3 recorded since 2012. Changed to History at Baseline.      Decreased WBC 1 02/04/19 03/15/19 resolved  Yes No No  Decreased WBC 2 04/29/19  ongoing  Yes No No  Breast Pain (tightness)  2 01/25/19  ongoing R/t Expanders/ valium No No No  Left Shoulder stiffness, pain 2 03/25/19  ongoing Physical Therapy No No No  Numbness bilat arms, intermittent 1 12/08/18  ongoing  No No No  Nausea 1 03/03/19 03/09/19 resolved R/t surgery-took ondansetron while Ribociclib was held No No No  Increased AST 1 03/15/19 04/29/19 resolved  No No No  Increased Alk Phos 1 03/15/19  ongoing  No No No  Increased ALT 2 03/15/19 03/29/19 decreased Ribociclib Held No No No  Increased ALT 1 03/29/19  ongoing  No No No  Cystic area on Liver shown on [name] (Fatty Liver) 1 04/04/19 ongoing   Fatty Liver per Dr. Jana Hakim. MRI ordered to r/o neoplasm after breast expanders are removed. No No No    Foye Spurling, BSN, RN Clinical Research Nurse 05/02/2019 2:17 PM

## 2019-05-03 ENCOUNTER — Other Ambulatory Visit: Payer: Self-pay | Admitting: Adult Health

## 2019-05-03 ENCOUNTER — Telehealth: Payer: Self-pay | Admitting: *Deleted

## 2019-05-03 ENCOUNTER — Ambulatory Visit: Payer: 59

## 2019-05-03 DIAGNOSIS — M6281 Muscle weakness (generalized): Secondary | ICD-10-CM

## 2019-05-03 DIAGNOSIS — C50912 Malignant neoplasm of unspecified site of left female breast: Secondary | ICD-10-CM | POA: Diagnosis not present

## 2019-05-03 DIAGNOSIS — Z5111 Encounter for antineoplastic chemotherapy: Secondary | ICD-10-CM | POA: Diagnosis not present

## 2019-05-03 DIAGNOSIS — Z483 Aftercare following surgery for neoplasm: Secondary | ICD-10-CM

## 2019-05-03 DIAGNOSIS — M25612 Stiffness of left shoulder, not elsewhere classified: Secondary | ICD-10-CM

## 2019-05-03 DIAGNOSIS — R7309 Other abnormal glucose: Secondary | ICD-10-CM

## 2019-05-03 DIAGNOSIS — Z9011 Acquired absence of right breast and nipple: Secondary | ICD-10-CM | POA: Diagnosis not present

## 2019-05-03 DIAGNOSIS — M25512 Pain in left shoulder: Secondary | ICD-10-CM

## 2019-05-03 DIAGNOSIS — C50812 Malignant neoplasm of overlapping sites of left female breast: Secondary | ICD-10-CM | POA: Diagnosis not present

## 2019-05-03 DIAGNOSIS — Z79899 Other long term (current) drug therapy: Secondary | ICD-10-CM | POA: Diagnosis not present

## 2019-05-03 DIAGNOSIS — Z006 Encounter for examination for normal comparison and control in clinical research program: Secondary | ICD-10-CM | POA: Diagnosis not present

## 2019-05-03 DIAGNOSIS — Z170 Estrogen receptor positive status [ER+]: Secondary | ICD-10-CM | POA: Diagnosis not present

## 2019-05-03 NOTE — Telephone Encounter (Signed)
New referral placed for patient.

## 2019-05-03 NOTE — Telephone Encounter (Signed)
Patient reports she has been scheduled with the Nutritionist here at [facility] again and she thinks she is supposed to be scheduled with Nutritionist at [facility] since the reason for the consult is to help with weight loss. She asks if someone can get her scheduled with the Nutritionist at [facility]. Informed patient I will inform [name] and Collaborative nurse to follow up on this appointment. Patient was appreciative.  Foye Spurling, BSN, RN Clinical Research Nurse 05/03/2019 12:05 PM

## 2019-05-03 NOTE — Therapy (Signed)
[place], [place], [address] Phone: [phone]   Fax:  [phone]  Physical Therapy Treatment  Patient Details  Name: Mackenzie [name] MRN: [id number] Date of Birth: [birth year] Referring Provider (PT): [place]   Encounter Date: 05/03/2019  PT End of Session - 05/03/19 0958    Visit Number  12    Number of Visits  17    Date for PT Re-Evaluation  05/24/19    PT Start Time  0916   pt arrived late   PT Stop Time  0958    PT Time Calculation (min)  42 min    Activity Tolerance  Patient tolerated treatment well    Behavior During Therapy  [facility] for tasks assessed/performed       Past Medical History:  Diagnosis Date  . Abnormal glucose 2018  . 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 Allergic rhinitis 2012  . Anemia 01/27/2017   prior to starting chemotherapy  . Breast cancer ([place]) 01/14/2017   Left breast  . 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 Hot flashes 03/2017  . Hyperlipidemia 03/20/2016  . Morbid obesity with body mass index (BMI) of 40.0 to 49.9 ([place]) 09/17/2017  . NCGS (non-celiac gluten sensitivity)   . Pre-diabetes   . Seasonal allergies 2012   seasonal allergies causes allergic rhinitis and itchy, dry eyes per pt  . Vitamin D deficiency 05/2015    Past Surgical History:  Procedure Laterality Date  . BREAST RECONSTRUCTION WITH PLACEMENT OF TISSUE EXPANDER AND FLEX HD (ACELLULAR HYDRATED DERMIS) Left 08/27/2017   Procedure: LEFT BREAST RECONSTRUCTION WITH PLACEMENT OF TISSUE EXPANDER AND FLEX HD;  Surgeon: Wallace Going, DO;  Location: [place];  Service: Plastics;  Laterality: Left;  . CESAREAN SECTION     x2  . LATISSIMUS FLAP TO BREAST Left 11/03/2018  . LATISSIMUS FLAP TO BREAST Left 11/03/2018   Procedure: LATISSIMUS FLAP TO LEFT BREAST;  Surgeon: Wallace Going, DO;  Location: [place];  Service: Plastics;  Laterality:  Left;  Marland Kitchen MASTECTOMY MODIFIED RADICAL Left 08/27/2017  . MASTECTOMY MODIFIED RADICAL Left 08/27/2017   Procedure: LEFT MODIFIED RADICAL MASTECTOMY;  Surgeon: Erroll Luna, MD;  Location: [place];  Service: General;  Laterality: Left;  Marland Kitchen MASTOPEXY Right 03/03/2019   Procedure: RIGHT BREAST MASTOPEXY/REDUCTION;  Surgeon: Wallace Going, DO;  Location: [place];  Service: Plastics;  Laterality: Right;  3 hours, please  . PORT-A-CATH REMOVAL Right 08/27/2017   Procedure: REMOVAL PORT-A-CATH RIGHT CHEST;  Surgeon: Erroll Luna, MD;  Location: [place];  Service: General;  Laterality: Right;  . PORTACATH PLACEMENT Right 01/28/2017   Procedure: INSERTION PORT-A-CATH WITH ULTRASOUND;  Surgeon: Erroll Luna, MD;  Location: [place];  Service: General;  Laterality: Right;  . REMOVAL OF TISSUE EXPANDER AND PLACEMENT OF IMPLANT Left 03/03/2019   Procedure: REMOVAL OF TISSUE EXPANDER AND PLACEMENT OF EXPANDER;  Surgeon: Wallace Going, DO;  Location: [place];  Service: Plastics;  Laterality: Left;  . TISSUE EXPANDER PLACEMENT Left 11/03/2018   Procedure: PLACEMENT OF TISSUE EXPANDER LEFT BREAST;  Surgeon: Wallace Going, DO;  Location: [place];  Service: Plastics;  Laterality: Left;  Total case time is 3.5 hours  . TUBAL LIGATION Bilateral 01/21/2011    There were no vitals filed for this visit.  Subjective Assessment - 05/03/19 0918    Subjective  Sorry I'm late, was helping my  daughter get logged onto her class. I just feel tight in my Lt axilla, lateral trunk, and inferior to breast. HEP is going well. Sleeve hasn't arrived yet.    Pertinent History  Left breast cancer diagnosed 01/10/17 by mammogram and needle biopsy. Then had neo-adjuvant chemo starting 02/10/17 until 06/26/17; had left mastectomy 08/27/17 with ALND and immediate expander placed. Radiation completed. Expander removed and replaced on May 27, 19 b/c it did not get expanded enough  prior to radiation, also had lat flap 11/02/17. Had expander removed and replaced again on 03/03/19 due to a small leak, pt had reduction on R breast at this time    Patient Stated Goals  to get strength back and ROM, be able to unhook bra easily    Currently in Pain?  No/denies                       [facility] Adult PT Treatment/Exercise - 05/03/19 0001      Manual Therapy   Soft tissue mobilization  in supine to Lt pec muscle and along L lateral trunk in area of tightness     Passive ROM  to Lt shoulder in direction of abduction, flexion, and D2 with prolonged holds while performing STM                  PT Long Term Goals - 04/26/19 1613      PT LONG TERM GOAL #1   Title  Pt will demonstrate 160 degrees of left shoulder flexion to allow her to reach overhead.    Baseline  137, 04/26/19- 141    Time  4    Period  Weeks    Status  On-going      PT LONG TERM GOAL #2   Title  Pt will demonstrate 160 degrees of left shoulder abduction to allow her to reach out to the side    Baseline  135, 04/26/19- 160    Time  4    Period  Weeks    Status  Achieved      PT LONG TERM GOAL #3   Title  Pt will report an 80% improvement in pain and tightness in L axilla to allow pt to move LUE without discomfort.    Baseline  04/26/19- 60% improved    Time  4    Period  Weeks    Status  On-going      PT LONG TERM GOAL #4   Title  Pt will be independent with a home exercise program for continued strengthening and stretching    Time  4    Period  Weeks    Status  On-going      PT LONG TERM GOAL #5   Title  Pt will report a 50% improvement in back pain in area of lat flap when walking to allow improved comfort    Baseline  04/26/19- 0% improvement    Time  4    Period  Weeks    Status  On-going            Plan - 05/03/19 1001    Clinical Impression Statement  Pt arrived late today and was c/o increased tightness so focused on manual therapy today. She reports doing  well with HEP stretches and not having any questions about these. Tolerated stretching well and some improvement noted with P/ROM by end of session and pt reports feeling some looser.    Personal Factors and Comorbidities  Profession;Time since onset of injury/illness/exacerbation    Examination-Activity Limitations  Caring for Others;Reach Overhead;Carry;Dressing    Examination-Participation Restrictions  Community Activity;Cleaning    Stability/Clinical Decision Making  Evolving/Moderate complexity    Rehab Potential  Good    Clinical Impairments Affecting Rehab Potential  hx of radiation    PT Frequency  2x / week    PT Duration  4 weeks    PT Treatment/Interventions  ADLs/Self Care Home Management;Therapeutic exercise;Patient/family education;Manual techniques;Manual lymph drainage;Compression bandaging;Scar mobilization;Passive range of motion;Therapeutic activities    PT Next Visit Plan  see how corner stretch is going, cont STM to left lateral trunk, see if sleeve came, continue PROM to L shoulder, STM to L pec,  AAROM exercises, teach Strength ABC before D/C    PT Home Exercise Plan  Access Code: [email or web address], supine over foam roll, rockwood for LUE, wall washing, 3 way raises, corner stretch    Consulted and Agree with Plan of Care  Patient       Patient will benefit from skilled therapeutic intervention in order to improve the following deficits and impairments:  Decreased range of motion, Impaired UE functional use, Decreased strength, Other (comment), Pain, Postural dysfunction  Visit Diagnosis: Stiffness of left shoulder, not elsewhere classified  Aftercare following surgery for neoplasm  Acute pain of left shoulder  Muscle weakness (generalized)     Problem List Patient Active Problem List   Diagnosis Date Noted  . Benign essential HTN 08/17/2018  . Morbid obesity with body mass index (BMI) of 40.0 to 49.9 ([place]) 09/17/2017  . 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  Malignant neoplasm of upper-inner quadrant of left breast in female, estrogen receptor positive ([place]) 07/27/2017  . Genetic testing 03/25/2017  . Malignant 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, estrogen receptor positive ([place]) 01/23/2017  . 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

## 2019-05-04 NOTE — Telephone Encounter (Signed)
Can you call the [facility] nutritionist, because we keep placing a new referral and the same thing keeps happening.

## 2019-05-06 ENCOUNTER — Telehealth: Payer: Self-pay

## 2019-05-06 NOTE — Telephone Encounter (Signed)
Attempted to call [facility] Nutritionist but was unable to reach a live person today.

## 2019-05-10 ENCOUNTER — Telehealth: Payer: Self-pay

## 2019-05-10 ENCOUNTER — Ambulatory Visit: Payer: 59 | Admitting: Physical Therapy

## 2019-05-10 ENCOUNTER — Other Ambulatory Visit: Payer: Self-pay

## 2019-05-10 ENCOUNTER — Ambulatory Visit (INDEPENDENT_AMBULATORY_CARE_PROVIDER_SITE_OTHER): Payer: 59 | Admitting: Surgical

## 2019-05-10 ENCOUNTER — Encounter: Payer: Self-pay | Admitting: Surgical

## 2019-05-10 VITALS — BP 160/91 | HR 81 | Temp 98.9°F | Ht 66.0 in | Wt 266.0 lb

## 2019-05-10 DIAGNOSIS — C50212 Malignant neoplasm of upper-inner quadrant of left female breast: Secondary | ICD-10-CM

## 2019-05-10 DIAGNOSIS — Z170 Estrogen receptor positive status [ER+]: Secondary | ICD-10-CM

## 2019-05-10 DIAGNOSIS — C773 Secondary and unspecified malignant neoplasm of axilla and upper limb lymph nodes: Secondary | ICD-10-CM

## 2019-05-10 DIAGNOSIS — Z9012 Acquired absence of left breast and nipple: Secondary | ICD-10-CM

## 2019-05-10 DIAGNOSIS — C50912 Malignant neoplasm of unspecified site of left female breast: Secondary | ICD-10-CM

## 2019-05-10 MED ORDER — CEPHALEXIN 500 MG PO CAPS: 500.0000 mg | ORAL_CAPSULE | Freq: Four times a day (QID) | ORAL | 0 refills | Status: AC

## 2019-05-10 MED ORDER — HYDROCODONE-ACETAMINOPHEN 5-325 MG PO TABS: 1.0000 | ORAL_TABLET | Freq: Four times a day (QID) | ORAL | 0 refills | Status: AC | PRN

## 2019-05-10 MED FILL — HYDROCODON-APAP 5-325: 5-325 | 5 days supply | Qty: 20 | Fill #0

## 2019-05-10 MED FILL — CEPHALEXIN 500 MG CAPSULE: 500 | 3 days supply | Qty: 20 | Fill #0

## 2019-05-10 NOTE — H&P (View-Only) (Signed)
Patient ID: Mackenzie [name], female    DOB: May 07, 1971, 48 y.o.   MRN: [id number]  Chief Complaint  Patient presents with  . Pre-op Exam    removal of (L) tissue expander and placement of implant     ICD-10-CM   1. 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 Malignant neoplasm of upper-inner quadrant of left breast in female, estrogen receptor positive ([place])  C50.212    Z17.0   3. Morbid obesity with body mass index (BMI) of 40.0 to 49.9 (HCC)  E66.01   4. Acquired absence of left breast  Z90.12    History of Present Illness: Mackenzie [name] is a 48 y.o.  female  with a history of left sided breast cancer treated with mastectomy and radiation. She underwent latissimus muscle flap. She previously had a leak of her expander. She also had a right breast mastopexy/reduction for symmetry.  She presents for preoperative evaluation for upcoming procedure, removal of tissue expander and placement of silicone implant, scheduled for 05/25/19 with Dr. Marla Roe.  The patient has not had problems with anesthesia.   She has a hx of HTN, Anemia, HLD. She reports that she has been doing well.  No recent colds or illnesses. No history of DVT, no family history of DVT, she is not taking any anticoagulants.  She is happy with current size of L breast expander. Overly skin superiorly is tight, but latissimus flap is less tight and has some room for expansion.  Past Medical History: Allergies: Allergies  Allergen Reactions  . Dilaudid [Hydromorphone] Itching   Current Medications:  Current Outpatient Medications:  .  acetaminophen (TYLENOL) 500 MG tablet, Take 1,000 mg by mouth every 6 (six) hours as needed (for pain/headaches.). , Disp: , Rfl:  .  B Complex Vitamins (VITAMIN B COMPLEX PO), Take 1 capsule by mouth 2 (two) times daily. , Disp: , Rfl:  .  budesonide-formoterol (SYMBICORT) 80-4.5 MCG/ACT inhaler, Inhale 2 puffs into the lungs every  morning. , Disp: , Rfl:  .  Calcium Carb-Cholecalciferol (CALCIUM 600+D3 PO), Take 1 tablet by mouth 2 (two) times daily. , Disp: , Rfl:  .  cetirizine (ZYRTEC) 10 MG tablet, Take 10 mg by mouth daily. , Disp: , Rfl:  .  Cholecalciferol (VITAMIN D-3) 5000 units TABS, Take 5,000 Units by mouth 2 (two) times daily. , Disp: , Rfl:  .  gabapentin (NEURONTIN) 300 MG capsule, Take 1 capsule (300 mg total) by mouth at bedtime. (Patient taking differently: Take 300 mg by mouth every other day. ), Disp: 90 capsule, Rfl: 4 .  HYDROcodone-acetaminophen (NORCO) 5-325 MG tablet, Take 1 tablet by mouth every 6 (six) hours as needed for moderate pain., Disp: 30 tablet, Rfl: 0 .  hydroxypropyl methylcellulose / hypromellose (ISOPTO TEARS / GONIOVISC) 2.5 % ophthalmic solution, Place 1 drop into both eyes daily as needed for dry eyes., Disp: , Rfl:  .  ibuprofen (ADVIL,MOTRIN) 200 MG tablet, Take 800 mg by mouth every 8 (eight) hours as needed for mild pain (for pain.). , Disp: , Rfl:  .  letrozole (FEMARA) 2.5 MG tablet, Take 1 tablet (2.5 mg total) by mouth daily., Disp: 90 tablet, Rfl: 4 .  loratadine (CLARITIN) 10 MG tablet, Take 1 tablet (10 mg total) by mouth daily. (Patient taking differently: Take 10 mg by mouth daily as needed for allergies. ), Disp: 90 tablet, Rfl: 2 .  Multiple Vitamin (  MULTIVITAMIN WITH MINERALS) TABS tablet, Take 1 tablet by mouth daily. , Disp: , Rfl:  .  Omega-3 Fatty Acids (FISH OIL) 1000 MG CAPS, Take 1,000 mg by mouth 2 (two) times daily. , Disp: , Rfl:  .  phenylephrine (SUDAFED PE) 10 MG TABS tablet, Take 10 mg by mouth every 4 (four) hours as needed., Disp: , Rfl:  .  Probiotic Product (PROBIOTIC DAILY PO), Take 1 capsule by mouth 2 (two) times daily. , Disp: , Rfl:  .  Turmeric 450 MG CAPS, Take 450 mg by mouth 2 (two) times daily., Disp: , Rfl:  .  ZINC OXIDE PO, Take 1 tablet by mouth 2 (two) times a day., Disp: , Rfl:  No current facility-administered medications for this  visit.   Facility-Administered Medications Ordered in Other Visits:  .  sodium chloride flush (NS) 0.9 % injection 10 mL, 10 mL, Intravenous, PRN, Magrinat, [name], MD, 10 mL at 03/10/17 1239  Past Medical Problems: Past Medical History:  Diagnosis Date  . Abnormal glucose 2018  . 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 Allergic rhinitis 2012  . Anemia 01/27/2017   prior to starting chemotherapy  . Breast cancer ([place]) 01/14/2017   Left breast  . 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 Hot flashes 03/2017  . Hyperlipidemia 03/20/2016  . Morbid obesity with body mass index (BMI) of 40.0 to 49.9 ([place]) 09/17/2017  . NCGS (non-celiac gluten sensitivity)   . Pre-diabetes   . Seasonal allergies 2012   seasonal allergies causes allergic rhinitis and itchy, dry eyes per pt  . Vitamin D deficiency 05/2015    Past Surgical History: Past Surgical History:  Procedure Laterality Date  . BREAST RECONSTRUCTION WITH PLACEMENT OF TISSUE EXPANDER AND FLEX HD (ACELLULAR HYDRATED DERMIS) Left 08/27/2017   Procedure: LEFT BREAST RECONSTRUCTION WITH PLACEMENT OF TISSUE EXPANDER AND FLEX HD;  Surgeon: Wallace Going, DO;  Location: [place];  Service: Plastics;  Laterality: Left;  . CESAREAN SECTION     x2  . LATISSIMUS FLAP TO BREAST Left 11/03/2018  . LATISSIMUS FLAP TO BREAST Left 11/03/2018   Procedure: LATISSIMUS FLAP TO LEFT BREAST;  Surgeon: Wallace Going, DO;  Location: [place];  Service: Plastics;  Laterality: Left;  Marland Kitchen MASTECTOMY MODIFIED RADICAL Left 08/27/2017  . MASTECTOMY MODIFIED RADICAL Left 08/27/2017   Procedure: LEFT MODIFIED RADICAL MASTECTOMY;  Surgeon: Erroll Luna, MD;  Location: [place];  Service: General;  Laterality: Left;  Marland Kitchen MASTOPEXY Right 03/03/2019   Procedure: RIGHT BREAST MASTOPEXY/REDUCTION;  Surgeon: Wallace Going, DO;  Location: [place];  Service: Plastics;  Laterality: Right;  3 hours, please    . PORT-A-CATH REMOVAL Right 08/27/2017   Procedure: REMOVAL PORT-A-CATH RIGHT CHEST;  Surgeon: Erroll Luna, MD;  Location: [place];  Service: General;  Laterality: Right;  . PORTACATH PLACEMENT Right 01/28/2017   Procedure: INSERTION PORT-A-CATH WITH ULTRASOUND;  Surgeon: Erroll Luna, MD;  Location: [place];  Service: General;  Laterality: Right;  . REMOVAL OF TISSUE EXPANDER AND PLACEMENT OF IMPLANT Left 03/03/2019   Procedure: REMOVAL OF TISSUE EXPANDER AND PLACEMENT OF EXPANDER;  Surgeon: Wallace Going, DO;  Location: [place];  Service: Plastics;  Laterality: Left;  . TISSUE EXPANDER PLACEMENT Left 11/03/2018   Procedure: PLACEMENT OF TISSUE EXPANDER LEFT BREAST;  Surgeon: Wallace Going, DO;  Location: [place] ;  Service: Plastics;  Laterality: Left;  Total case time is  3.5 hours  . TUBAL LIGATION Bilateral 01/21/2011    Social History: Social History   Socioeconomic History  . Marital status: Legally Separated    Spouse name: Not on file  . Number of children: Not on file  . Years of education: Not on file  . Highest education level: Not on file  Occupational History  . Not on file  Social Needs  . Financial resource strain: Not on file  . Food insecurity    Worry: Not on file    Inability: Not on file  . Transportation needs    Medical: Not on file    Non-medical: Not on file  Tobacco Use  . Smoking status: Never Smoker  . Smokeless tobacco: Never Used  Substance and Sexual Activity  . Alcohol use: Yes    Comment: social  . Drug use: No  . Sexual activity: Not on file    Comment: BTL  Lifestyle  . Physical activity    Days per week: Not on file    Minutes per session: Not on file  . Stress: Not on file  Relationships  . Social Herbalist on phone: Not on file    Gets together: Not on file    Attends religious service: Not on file    Active member of club or organization: Not on file    Attends meetings  of clubs or organizations: Not on file    Relationship status: Not on file  . Intimate partner violence    Fear of current or ex partner: No    Emotionally abused: No    Physically abused: No    Forced sexual activity: No  Other Topics Concern  . Not on file  Social History Narrative  . Not on file    Family History: Family History  Problem Relation Age of Onset  . Breast cancer Paternal Grandmother 85       d.60s from breast cancer. Did not have treatment.  . Other Mother        [id number] from complications of surgery to remove brain tumor    Review of Systems: Review of Systems  Constitutional: Negative for chills, diaphoresis, fever, malaise/fatigue and weight loss.  Respiratory: Negative.   Cardiovascular: Negative.   Genitourinary: Negative.   Musculoskeletal: Positive for myalgias (mild, left pec).  Skin: Negative for itching and rash.  Neurological: Negative.    Physical Exam: Vital Signs BP (!) 160/91 (BP Location: Right Arm, Patient Position: Sitting, Cuff Size: Large)   Pulse 81   Temp 98.9 F (37.2 C) (Oral)   Ht 5\' 6"  (1.676 m)   Wt 266 lb (120.7 kg) Comment: Per patient  SpO2 98%   BMI 42.93 kg/m  Physical Exam Exam conducted with a chaperone present.  Constitutional:      General: She is not in acute distress.    Appearance: Normal appearance. She is not ill-appearing.  HENT:     Head: Normocephalic and atraumatic.  Eyes:     Pupils: Pupils are equal, round Neck:     Musculoskeletal: Normal range of motion.  Cardiovascular:     Rate and Rhythm: Normal rate and regular rhythm.     Pulses: Normal pulses.     Heart sounds: Normal heart sounds. No murmur.  Pulmonary:     Effort: Pulmonary effort is normal. No respiratory distress.     Breath sounds: Normal breath sounds. No wheezing.  Abdominal:     General: Abdomen is flat. There is  no distension.     Palpations: Abdomen is soft.     Tenderness: There is no abdominal tenderness. Breast: Left  breast latissimus flap, well healed incisions. Radiation damaged skin. Right breast s/p reduction/lift with well healing incisions. Musculoskeletal: Normal range of motion.  Skin:    General: Skin is warm and dry.     Findings: No erythema or rash.  Neurological:     General: No focal deficit present.     Mental Status: She is alert and oriented to person, place, and time. Mental status is at baseline.     Motor: No weakness.  Psychiatric:        Mood and Affect: Mood normal.        Behavior: Behavior normal.     Assessment/Plan: The patient is scheduled for removal of tissue expander and placement of implant with Dr. Marla Roe.  The risks that can be encountered with and after placement of a breast implant were discussed and include the following but not limited to these: bleeding, infection, delayed healing, anesthesia risks, skin sensation changes, injury to structures including nerves, blood vessels, and muscles which may be temporary or permanent, allergies to tape, suture materials and glues, blood products, topical preparations or injected agents, skin contour irregularities, skin discoloration and swelling, deep vein thrombosis, cardiac and pulmonary complications, pain, which may persist, fluid accumulation, wrinkling of the skin over the implanmt, changes in nipple or breast sensation, implant leakage or rupture, faulty position of the implant, persistent pain, formation of tight scar tissue around the implant (capsular contracture).  Risks and benefits explained. She is happy with current size of L expander.  We placed injectable saline in the Expander using a sterile technique: Left: 30 cc for a total of 690 / 650 cc  Rx sent to pharmacy COVID scheduled  Electronically signed by: Carola Rhine Edell Mesenbrink, PA-C 05/10/2019 9:36 AM

## 2019-05-10 NOTE — Progress Notes (Signed)
Patient ID: Mackenzie [name], female    DOB: 1970/10/22, 49 y.o.   MRN: [id number]  Chief Complaint  Patient presents with   Pre-op Exam    removal of (L) tissue expander and placement of implant     ICD-10-CM   1. 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 Malignant neoplasm of upper-inner quadrant of left breast in female, estrogen receptor positive ([place])  C50.212    Z17.0   3. Morbid obesity with body mass index (BMI) of 40.0 to 49.9 (HCC)  E66.01   4. Acquired absence of left breast  Z90.12    History of Present Illness: Mackenzie [name] is a 48 y.o.  female  with a history of left sided breast cancer treated with mastectomy and radiation. She underwent latissimus muscle flap. She previously had a leak of her expander. She also had a right breast mastopexy/reduction for symmetry.  She presents for preoperative evaluation for upcoming procedure, removal of tissue expander and placement of silicone implant, scheduled for 05/25/19 with Dr. Marla Roe.  The patient has not had problems with anesthesia.   She has a hx of HTN, Anemia, HLD. She reports that she has been doing well.  No recent colds or illnesses. No history of DVT, no family history of DVT, she is not taking any anticoagulants.  She is happy with current size of L breast expander. Overly skin superiorly is tight, but latissimus flap is less tight and has some room for expansion.  Past Medical History: Allergies: Allergies  Allergen Reactions   Dilaudid [Hydromorphone] Itching   Current Medications:  Current Outpatient Medications:    acetaminophen (TYLENOL) 500 MG tablet, Take 1,000 mg by mouth every 6 (six) hours as needed (for pain/headaches.). , Disp: , Rfl:    B Complex Vitamins (VITAMIN B COMPLEX PO), Take 1 capsule by mouth 2 (two) times daily. , Disp: , Rfl:    budesonide-formoterol (SYMBICORT) 80-4.5 MCG/ACT inhaler, Inhale 2 puffs into the lungs every  morning. , Disp: , Rfl:    Calcium Carb-Cholecalciferol (CALCIUM 600+D3 PO), Take 1 tablet by mouth 2 (two) times daily. , Disp: , Rfl:    cetirizine (ZYRTEC) 10 MG tablet, Take 10 mg by mouth daily. , Disp: , Rfl:    Cholecalciferol (VITAMIN D-3) 5000 units TABS, Take 5,000 Units by mouth 2 (two) times daily. , Disp: , Rfl:    gabapentin (NEURONTIN) 300 MG capsule, Take 1 capsule (300 mg total) by mouth at bedtime. (Patient taking differently: Take 300 mg by mouth every other day. ), Disp: 90 capsule, Rfl: 4   HYDROcodone-acetaminophen (NORCO) 5-325 MG tablet, Take 1 tablet by mouth every 6 (six) hours as needed for moderate pain., Disp: 30 tablet, Rfl: 0   hydroxypropyl methylcellulose / hypromellose (ISOPTO TEARS / GONIOVISC) 2.5 % ophthalmic solution, Place 1 drop into both eyes daily as needed for dry eyes., Disp: , Rfl:    ibuprofen (ADVIL,MOTRIN) 200 MG tablet, Take 800 mg by mouth every 8 (eight) hours as needed for mild pain (for pain.). , Disp: , Rfl:    letrozole (FEMARA) 2.5 MG tablet, Take 1 tablet (2.5 mg total) by mouth daily., Disp: 90 tablet, Rfl: 4   loratadine (CLARITIN) 10 MG tablet, Take 1 tablet (10 mg total) by mouth daily. (Patient taking differently: Take 10 mg by mouth daily as needed for allergies. ), Disp: 90 tablet, Rfl: 2   Multiple Vitamin (  MULTIVITAMIN WITH MINERALS) TABS tablet, Take 1 tablet by mouth daily. , Disp: , Rfl:    Omega-3 Fatty Acids (FISH OIL) 1000 MG CAPS, Take 1,000 mg by mouth 2 (two) times daily. , Disp: , Rfl:    phenylephrine (SUDAFED PE) 10 MG TABS tablet, Take 10 mg by mouth every 4 (four) hours as needed., Disp: , Rfl:    Probiotic Product (PROBIOTIC DAILY PO), Take 1 capsule by mouth 2 (two) times daily. , Disp: , Rfl:    Turmeric 450 MG CAPS, Take 450 mg by mouth 2 (two) times daily., Disp: , Rfl:    ZINC OXIDE PO, Take 1 tablet by mouth 2 (two) times a day., Disp: , Rfl:  No current facility-administered medications for this  visit.   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, left ([place]) 01/23/2017   Hot flashes 03/2017   Hyperlipidemia 03/20/2016   Morbid obesity with body mass index (BMI) of 40.0 to 49.9 ([place]) 09/17/2017   NCGS (non-celiac gluten sensitivity)    Pre-diabetes    Seasonal allergies 2012   seasonal allergies causes allergic rhinitis and itchy, dry eyes per pt   Vitamin D deficiency 05/2015    Past Surgical History: Past Surgical History:  Procedure Laterality Date   BREAST RECONSTRUCTION WITH PLACEMENT OF TISSUE EXPANDER AND FLEX HD (ACELLULAR HYDRATED DERMIS) Left 08/27/2017   Procedure: LEFT BREAST RECONSTRUCTION WITH PLACEMENT OF TISSUE EXPANDER AND FLEX HD;  Surgeon: Wallace Going, DO;  Location: [facility];  Service: Plastics;  Laterality: Left;   CESAREAN SECTION     x2   LATISSIMUS FLAP TO BREAST Left 11/03/2018   LATISSIMUS FLAP TO BREAST Left 11/03/2018   Procedure: LATISSIMUS FLAP TO LEFT BREAST;  Surgeon: Wallace Going, DO;  Location: [facility];  Service: Plastics;  Laterality: Left;   MASTECTOMY MODIFIED RADICAL Left 08/27/2017   MASTECTOMY MODIFIED RADICAL Left 08/27/2017   Procedure: LEFT MODIFIED RADICAL MASTECTOMY;  Surgeon: Erroll Luna, MD;  Location: [place];  Service: General;  Laterality: Left;   MASTOPEXY Right 03/03/2019   Procedure: RIGHT BREAST MASTOPEXY/REDUCTION;  Surgeon: Wallace Going, DO;  Location: [place];  Service: Plastics;  Laterality: Right;  3 hours, please     PORT-A-CATH REMOVAL Right 08/27/2017   Procedure: REMOVAL PORT-A-CATH RIGHT CHEST;  Surgeon: Erroll Luna, MD;  Location: [place] [facility] [place];  Service: General;  Laterality: Right;   PORTACATH PLACEMENT Right 01/28/2017   Procedure: INSERTION PORT-A-CATH WITH ULTRASOUND;  Surgeon: Erroll Luna, MD;  Location: [place] [facility];  Service: General;  Laterality: Right;   REMOVAL OF TISSUE EXPANDER AND PLACEMENT OF IMPLANT Left 03/03/2019   Procedure: REMOVAL OF TISSUE EXPANDER AND PLACEMENT OF EXPANDER;  Surgeon: Wallace Going, DO;  Location: [name];  Service: Plastics;  Laterality: Left;   TISSUE EXPANDER PLACEMENT Left 11/03/2018   Procedure: PLACEMENT OF TISSUE EXPANDER LEFT BREAST;  Surgeon: Wallace Going, DO;  Location: [facility];  Service: Plastics;  Laterality: Left;  Total case time is  3.5 hours   TUBAL LIGATION Bilateral 01/21/2011    Social History: Social History   Socioeconomic History   Marital status: Legally Separated    Spouse name: Not on file   Number of children: Not on file   Years of education: Not on file   Highest education level: Not on file  Occupational History   Not on file  Social Needs   Financial resource strain: Not on file   Food insecurity    Worry: Not on file    Inability: Not on file   Transportation needs    Medical: Not on file    Non-medical: Not on file  Tobacco Use   Smoking status: Never Smoker   Smokeless tobacco: Never Used  Substance and Sexual Activity   Alcohol use: Yes    Comment: social   Drug use: No   Sexual activity: Not on file    Comment: BTL  Lifestyle   Physical activity    Days per week: Not on file    Minutes per session: Not on file   Stress: Not on file  Relationships   Social connections    Talks on phone: Not on file    Gets together: Not on file    Attends religious service: Not on file    Active member of club or organization: Not on file    Attends meetings  of clubs or organizations: Not on file    Relationship status: Not on file   Intimate partner violence    Fear of current or ex partner: No    Emotionally abused: No    Physically abused: No    Forced sexual activity: No  Other Topics Concern   Not on file  Social History Narrative   Not on file    Family History: Family History  Problem Relation Age of Onset   Breast cancer Paternal Grandmother 67       d.60s from breast cancer. Did not have treatment.   Other Mother        [id number] from complications of surgery to remove brain tumor    Review of Systems: Review of Systems  Constitutional: Negative for chills, diaphoresis, fever, malaise/fatigue and weight loss.  Respiratory: Negative.   Cardiovascular: Negative.   Genitourinary: Negative.   Musculoskeletal: Positive for myalgias (mild, left pec).  Skin: Negative for itching and rash.  Neurological: Negative.    Physical Exam: Vital Signs BP (!) 160/91 (BP Location: Right Arm, Patient Position: Sitting, Cuff Size: Large)    Pulse 81    Temp 98.9 F (37.2 C) (Oral)    Ht 5\' 6"  (1.676 m)    Wt 266 lb (120.7 kg) Comment: Per patient   SpO2 98%    BMI 42.93 kg/m  Physical Exam Exam conducted with a chaperone present.  Constitutional:      General: She is not in acute distress.    Appearance: Normal appearance. She is not ill-appearing.  HENT:     Head: Normocephalic and atraumatic.  Eyes:     Pupils: Pupils are equal, round Neck:     Musculoskeletal: Normal range of motion.  Cardiovascular:     Rate and Rhythm: Normal rate and regular rhythm.     Pulses: Normal pulses.     Heart sounds: Normal heart sounds. No murmur.  Pulmonary:     Effort: Pulmonary effort is normal. No respiratory distress.     Breath sounds: Normal breath sounds. No wheezing.  Abdominal:  General: Abdomen is flat. There is no distension.     Palpations: Abdomen is soft.     Tenderness: There is no abdominal tenderness. Breast: Left  breast latissimus flap, well healed incisions. Radiation damaged skin. Right breast s/p reduction/lift with well healing incisions. Musculoskeletal: Normal range of motion.  Skin:    General: Skin is warm and dry.     Findings: No erythema or rash.  Neurological:     General: No focal deficit present.     Mental Status: She is alert and oriented to person, place, and time. Mental status is at baseline.     Motor: No weakness.  Psychiatric:        Mood and Affect: Mood normal.        Behavior: Behavior normal.     Assessment/Plan: The patient is scheduled for removal of tissue expander and placement of implant with Dr. Marla Roe.  The risks that can be encountered with and after placement of a breast implant were discussed and include the following but not limited to these: bleeding, infection, delayed healing, anesthesia risks, skin sensation changes, injury to structures including nerves, blood vessels, and muscles which may be temporary or permanent, allergies to tape, suture materials and glues, blood products, topical preparations or injected agents, skin contour irregularities, skin discoloration and swelling, deep vein thrombosis, cardiac and pulmonary complications, pain, which may persist, fluid accumulation, wrinkling of the skin over the implanmt, changes in nipple or breast sensation, implant leakage or rupture, faulty position of the implant, persistent pain, formation of tight scar tissue around the implant (capsular contracture).  Risks and benefits explained. She is happy with current size of L expander.  We placed injectable saline in the Expander using a sterile technique: Left: 30 cc for a total of 690 / 650 cc  Rx sent to pharmacy COVID scheduled  Electronically signed by: Carola Rhine Tabari Volkert, PA-C 05/10/2019 9:36 AM

## 2019-05-10 NOTE — Telephone Encounter (Signed)
Spoke with [facility] nutritionist.  Patient has appt on 06/08/19.

## 2019-05-12 ENCOUNTER — Other Ambulatory Visit: Payer: Self-pay

## 2019-05-12 ENCOUNTER — Encounter: Payer: Self-pay | Admitting: Physical Therapy

## 2019-05-12 ENCOUNTER — Ambulatory Visit: Payer: 59 | Attending: Surgical | Admitting: Physical Therapy

## 2019-05-12 ENCOUNTER — Encounter: Payer: Self-pay | Admitting: Oncology

## 2019-05-12 ENCOUNTER — Encounter: Payer: 59 | Admitting: Physical Therapy

## 2019-05-12 DIAGNOSIS — M25512 Pain in left shoulder: Secondary | ICD-10-CM | POA: Diagnosis not present

## 2019-05-12 DIAGNOSIS — M25612 Stiffness of left shoulder, not elsewhere classified: Secondary | ICD-10-CM

## 2019-05-12 DIAGNOSIS — H31012 Macula scars of posterior pole (postinflammatory) (post-traumatic), left eye: Secondary | ICD-10-CM | POA: Diagnosis not present

## 2019-05-12 DIAGNOSIS — Z483 Aftercare following surgery for neoplasm: Secondary | ICD-10-CM | POA: Diagnosis not present

## 2019-05-12 MED ORDER — INV-RIBOCICLIB 200 MG TAB NATALEE TRIO003 STUDY: 400.0000 mg | ORAL_TABLET | Freq: Every day | ORAL | 0 refills | Status: DC

## 2019-05-12 NOTE — Therapy (Signed)
[place], [place], [address] Phone: [phone]   Fax:  [phone]  Physical Therapy Treatment  Patient Details  Name: Mackenzie [name] MRN: [id number] Date of Birth: 01/30/1971 Referring Provider (PT): Dillingham   Encounter Date: 05/12/2019  PT End of Session - 05/12/19 1349    Visit Number  13    Number of Visits  17    Date for PT Re-Evaluation  05/24/19    PT Start Time  1310    PT Stop Time  1350    PT Time Calculation (min)  40 min    Activity Tolerance  Patient tolerated treatment well    Behavior During Therapy  [facility] for tasks assessed/performed       Past Medical History:  Diagnosis Date  . Abnormal glucose 2018  . 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 Allergic rhinitis 2012  . Anemia 01/27/2017   prior to starting chemotherapy  . Breast cancer ([place]) 01/14/2017   Left breast  . 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 Hot flashes 03/2017  . Hyperlipidemia 03/20/2016  . Morbid obesity with body mass index (BMI) of 40.0 to 49.9 ([place]) 09/17/2017  . NCGS (non-celiac gluten sensitivity)   . Pre-diabetes   . Seasonal allergies 2012   seasonal allergies causes allergic rhinitis and itchy, dry eyes per pt  . Vitamin D deficiency 05/2015    Past Surgical History:  Procedure Laterality Date  . BREAST RECONSTRUCTION WITH PLACEMENT OF TISSUE EXPANDER AND FLEX HD (ACELLULAR HYDRATED DERMIS) Left 08/27/2017   Procedure: LEFT BREAST RECONSTRUCTION WITH PLACEMENT OF TISSUE EXPANDER AND FLEX HD;  Surgeon: Wallace Going, DO;  Location: [place];  Service: Plastics;  Laterality: Left;  . CESAREAN SECTION     x2  . LATISSIMUS FLAP TO BREAST Left 11/03/2018  . LATISSIMUS FLAP TO BREAST Left 11/03/2018   Procedure: LATISSIMUS FLAP TO LEFT BREAST;  Surgeon: Wallace Going, DO;  Location: [place];  Service: Plastics;  Laterality: Left;  Marland Kitchen MASTECTOMY  MODIFIED RADICAL Left 08/27/2017  . MASTECTOMY MODIFIED RADICAL Left 08/27/2017   Procedure: LEFT MODIFIED RADICAL MASTECTOMY;  Surgeon: Erroll Luna, MD;  Location: [place];  Service: General;  Laterality: Left;  Marland Kitchen MASTOPEXY Right 03/03/2019   Procedure: RIGHT BREAST MASTOPEXY/REDUCTION;  Surgeon: Wallace Going, DO;  Location: [place];  Service: Plastics;  Laterality: Right;  3 hours, please  . PORT-A-CATH REMOVAL Right 08/27/2017   Procedure: REMOVAL PORT-A-CATH RIGHT CHEST;  Surgeon: Erroll Luna, MD;  Location: [place];  Service: General;  Laterality: Right;  . PORTACATH PLACEMENT Right 01/28/2017   Procedure: INSERTION PORT-A-CATH WITH ULTRASOUND;  Surgeon: Erroll Luna, MD;  Location: [place];  Service: General;  Laterality: Right;  . REMOVAL OF TISSUE EXPANDER AND PLACEMENT OF IMPLANT Left 03/03/2019   Procedure: REMOVAL OF TISSUE EXPANDER AND PLACEMENT OF EXPANDER;  Surgeon: Wallace Going, DO;  Location: [place];  Service: Plastics;  Laterality: Left;  . TISSUE EXPANDER PLACEMENT Left 11/03/2018   Procedure: PLACEMENT OF TISSUE EXPANDER LEFT BREAST;  Surgeon: Wallace Going, DO;  Location: [place];  Service: Plastics;  Laterality: Left;  Total case time is 3.5 hours  . TUBAL LIGATION Bilateral 01/21/2011    There were no vitals filed for this visit.  Subjective Assessment - 05/12/19 1312    Subjective  I think my shoulder is a little bit looser but  it is still tight since I got filled yesterday.    Pertinent History  Left breast cancer diagnosed 01/10/17 by mammogram and needle biopsy. Then had neo-adjuvant chemo starting 02/10/17 until 06/26/17; had left mastectomy 08/27/17 with ALND and immediate expander placed. Radiation completed. Expander removed and replaced on May 27, 19 b/c it did not get expanded enough prior to radiation, also had lat flap 11/02/17. Had expander removed and replaced again on 03/03/19 due to a small  leak, pt had reduction on R breast at this time    Patient Stated Goals  to get strength back and ROM, be able to unhook bra easily    Currently in Pain?  No/denies    Pain Score  0-No pain                       [facility] Adult PT Treatment/Exercise - 05/12/19 0001      Manual Therapy   Soft tissue mobilization  in supine to Lt pec muscle and along L lateral trunk in area of tightness     Passive ROM  to Lt shoulder in direction of abduction, flexion, and D2 with prolonged holds while performing STM                  PT Long Term Goals - 04/26/19 1613      PT LONG TERM GOAL #1   Title  Pt will demonstrate 160 degrees of left shoulder flexion to allow her to reach overhead.    Baseline  137, 04/26/19- 141    Time  4    Period  Weeks    Status  On-going      PT LONG TERM GOAL #2   Title  Pt will demonstrate 160 degrees of left shoulder abduction to allow her to reach out to the side    Baseline  135, 04/26/19- 160    Time  4    Period  Weeks    Status  Achieved      PT LONG TERM GOAL #3   Title  Pt will report an 80% improvement in pain and tightness in L axilla to allow pt to move LUE without discomfort.    Baseline  04/26/19- 60% improved    Time  4    Period  Weeks    Status  On-going      PT LONG TERM GOAL #4   Title  Pt will be independent with a home exercise program for continued strengthening and stretching    Time  4    Period  Weeks    Status  On-going      PT LONG TERM GOAL #5   Title  Pt will report a 50% improvement in back pain in area of lat flap when walking to allow improved comfort    Baseline  04/26/19- 0% improvement    Time  4    Period  Weeks    Status  On-going            Plan - 05/12/19 1352    Clinical Impression Statement  Pt is having expanders removed and implants placed on Dec 16. Continued to work on decreasing left axillary and pec tightness to allow improved left shoulder ROM today. Her ROM improved from  beginning of session to end of session with PROM, manual therapy and prolonged stretching.    PT Frequency  2x / week    PT Duration  4 weeks    PT  Treatment/Interventions  ADLs/Self Care Home Management;Therapeutic exercise;Patient/family education;Manual techniques;Manual lymph drainage;Compression bandaging;Scar mobilization;Passive range of motion;Therapeutic activities    PT Next Visit Plan  see how corner stretch is going, cont STM to left lateral trunk, see if sleeve came, continue PROM to L shoulder, STM to L pec,  AAROM exercises, teach Strength ABC before D/C    PT Home Exercise Plan  Access Code: [email or web address], supine over foam roll, rockwood for LUE, wall washing, 3 way raises, corner stretch    Consulted and Agree with Plan of Care  Patient       Patient will benefit from skilled therapeutic intervention in order to improve the following deficits and impairments:  Decreased range of motion, Impaired UE functional use, Decreased strength, Other (comment), Pain, Postural dysfunction  Visit Diagnosis: Stiffness of left shoulder, not elsewhere classified  Aftercare following surgery for neoplasm  Acute pain of left shoulder     Problem List Patient Active Problem List   Diagnosis Date Noted  . Benign essential HTN 08/17/2018  . Morbid obesity with body mass index (BMI) of 40.0 to 49.9 ([place]) 09/17/2017  . 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 Malignant neoplasm of upper-inner quadrant of left breast in female, estrogen receptor positive ([place]) 07/27/2017  . Genetic testing 03/25/2017  . Malignant 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, estrogen receptor positive ([place]) 01/23/2017  . 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

## 2019-05-13 ENCOUNTER — Ambulatory Visit: Payer: 59 | Admitting: Rehabilitation

## 2019-05-17 ENCOUNTER — Other Ambulatory Visit: Payer: Self-pay

## 2019-05-17 ENCOUNTER — Ambulatory Visit: Payer: 59 | Admitting: Physical Therapy

## 2019-05-17 ENCOUNTER — Encounter: Payer: Self-pay | Admitting: Physical Therapy

## 2019-05-17 DIAGNOSIS — Z483 Aftercare following surgery for neoplasm: Secondary | ICD-10-CM | POA: Diagnosis not present

## 2019-05-17 DIAGNOSIS — M25612 Stiffness of left shoulder, not elsewhere classified: Secondary | ICD-10-CM | POA: Diagnosis not present

## 2019-05-17 DIAGNOSIS — M25512 Pain in left shoulder: Secondary | ICD-10-CM | POA: Diagnosis not present

## 2019-05-17 NOTE — Therapy (Signed)
[place], [place], [address] Phone: [phone]   Fax:  [phone]  Physical Therapy Treatment  Patient Details  Name: Mackenzie [name] MRN: [id number] Date of Birth: Oct 25, 1970 Referring Provider (PT): Dillingham   Encounter Date: 05/17/2019  PT End of Session - 05/17/19 1649    Visit Number  14    Number of Visits  17    Date for PT Re-Evaluation  05/24/19    PT Start Time  [id number]   pt arrived late   PT Stop Time  1645    PT Time Calculation (min)  30 min    Activity Tolerance  Patient tolerated treatment well    Behavior During Therapy  [facility] for tasks assessed/performed       Past Medical History:  Diagnosis Date  . Abnormal glucose 2018  . 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 Allergic rhinitis 2012  . Anemia 01/27/2017   prior to starting chemotherapy  . Breast cancer ([place]) 01/14/2017   Left breast  . 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 Hot flashes 03/2017  . Hyperlipidemia 03/20/2016  . Morbid obesity with body mass index (BMI) of 40.0 to 49.9 ([place]) 09/17/2017  . NCGS (non-celiac gluten sensitivity)   . Pre-diabetes   . Seasonal allergies 2012   seasonal allergies causes allergic rhinitis and itchy, dry eyes per pt  . Vitamin D deficiency 05/2015    Past Surgical History:  Procedure Laterality Date  . BREAST RECONSTRUCTION WITH PLACEMENT OF TISSUE EXPANDER AND FLEX HD (ACELLULAR HYDRATED DERMIS) Left 08/27/2017   Procedure: LEFT BREAST RECONSTRUCTION WITH PLACEMENT OF TISSUE EXPANDER AND FLEX HD;  Surgeon: Wallace Going, DO;  Location: [place];  Service: Plastics;  Laterality: Left;  . CESAREAN SECTION     x2  . LATISSIMUS FLAP TO BREAST Left 11/03/2018  . LATISSIMUS FLAP TO BREAST Left 11/03/2018   Procedure: LATISSIMUS FLAP TO LEFT BREAST;  Surgeon: Wallace Going, DO;  Location: [place];  Service: Plastics;  Laterality:  Left;  Marland Kitchen MASTECTOMY MODIFIED RADICAL Left 08/27/2017  . MASTECTOMY MODIFIED RADICAL Left 08/27/2017   Procedure: LEFT MODIFIED RADICAL MASTECTOMY;  Surgeon: Erroll Luna, MD;  Location: [place];  Service: General;  Laterality: Left;  Marland Kitchen MASTOPEXY Right 03/03/2019   Procedure: RIGHT BREAST MASTOPEXY/REDUCTION;  Surgeon: Wallace Going, DO;  Location: [place];  Service: Plastics;  Laterality: Right;  3 hours, please  . PORT-A-CATH REMOVAL Right 08/27/2017   Procedure: REMOVAL PORT-A-CATH RIGHT CHEST;  Surgeon: Erroll Luna, MD;  Location: [place];  Service: General;  Laterality: Right;  . PORTACATH PLACEMENT Right 01/28/2017   Procedure: INSERTION PORT-A-CATH WITH ULTRASOUND;  Surgeon: Erroll Luna, MD;  Location: [place];  Service: General;  Laterality: Right;  . REMOVAL OF TISSUE EXPANDER AND PLACEMENT OF IMPLANT Left 03/03/2019   Procedure: REMOVAL OF TISSUE EXPANDER AND PLACEMENT OF EXPANDER;  Surgeon: Wallace Going, DO;  Location: [place];  Service: Plastics;  Laterality: Left;  . TISSUE EXPANDER PLACEMENT Left 11/03/2018   Procedure: PLACEMENT OF TISSUE EXPANDER LEFT BREAST;  Surgeon: Wallace Going, DO;  Location: [place];  Service: Plastics;  Laterality: Left;  Total case time is 3.5 hours  . TUBAL LIGATION Bilateral 01/21/2011    There were no vitals filed for this visit.  Subjective Assessment - 05/17/19 1648    Subjective  My shoulder feels more loose today  Pertinent History  Left breast cancer diagnosed 01/10/17 by mammogram and needle biopsy. Then had neo-adjuvant chemo starting 02/10/17 until 06/26/17; had left mastectomy 08/27/17 with ALND and immediate expander placed. Radiation completed. Expander removed and replaced on May 27, 19 b/c it did not get expanded enough prior to radiation, also had lat flap 11/02/17. Had expander removed and replaced again on 03/03/19 due to a small leak, pt had reduction on R breast at  this time    Patient Stated Goals  to get strength back and ROM, be able to unhook bra easily    Currently in Pain?  No/denies    Pain Score  0-No pain         [facility] PT Assessment - 05/17/19 0001      AROM   Left Shoulder Flexion  145 Degrees    Left Shoulder ABduction  158 Degrees                   [facility] Adult PT Treatment/Exercise - 05/17/19 0001      Manual Therapy   Soft tissue mobilization  in supine to Lt pec muscle and along L lateral trunk in area of tightness     Passive ROM  to Lt shoulder in direction of abduction, flexion, and D2 with prolonged holds while performing STM                  PT Long Term Goals - 04/26/19 1613      PT LONG TERM GOAL #1   Title  Pt will demonstrate 160 degrees of left shoulder flexion to allow her to reach overhead.    Baseline  137, 04/26/19- 141    Time  4    Period  Weeks    Status  On-going      PT LONG TERM GOAL #2   Title  Pt will demonstrate 160 degrees of left shoulder abduction to allow her to reach out to the side    Baseline  135, 04/26/19- 160    Time  4    Period  Weeks    Status  Achieved      PT LONG TERM GOAL #3   Title  Pt will report an 80% improvement in pain and tightness in L axilla to allow pt to move LUE without discomfort.    Baseline  04/26/19- 60% improved    Time  4    Period  Weeks    Status  On-going      PT LONG TERM GOAL #4   Title  Pt will be independent with a home exercise program for continued strengthening and stretching    Time  4    Period  Weeks    Status  On-going      PT LONG TERM GOAL #5   Title  Pt will report a 50% improvement in back pain in area of lat flap when walking to allow improved comfort    Baseline  04/26/19- 0% improvement    Time  4    Period  Weeks    Status  On-going            Plan - 05/17/19 1649    Clinical Impression Statement  Pt has one more appointment prior to having her expanders removed and implants placed. She has  gained 5 degrees of active left shoulder flexion but her abduction remains pretty much the same. She is still having a lot of muscle tightness along left lateral trunk and  across [place]. Next session with instrut pt in Strength ABC program prior to placing pt on hold for surgery and recovery.    PT Frequency  2x / week    PT Duration  4 weeks    PT Treatment/Interventions  ADLs/Self Care Home Management;Therapeutic exercise;Patient/family education;Manual techniques;Manual lymph drainage;Compression bandaging;Scar mobilization;Passive range of motion;Therapeutic activities    PT Next Visit Plan  instruct in strength ABC and place on hold, have pt make follow up appt in Jan    PT Home Exercise Plan  Access Code: [email or web address], supine over foam roll, rockwood for LUE, wall washing, 3 way raises, corner stretch    Consulted and Agree with Plan of Care  Patient       Patient will benefit from skilled therapeutic intervention in order to improve the following deficits and impairments:  Decreased range of motion, Impaired UE functional use, Decreased strength, Other (comment), Pain, Postural dysfunction  Visit Diagnosis: Stiffness of left shoulder, not elsewhere classified  Aftercare following surgery for neoplasm  Acute pain of left shoulder     Problem List Patient Active Problem List   Diagnosis Date Noted  . Benign essential HTN 08/17/2018  . Morbid obesity with body mass index (BMI) of 40.0 to 49.9 ([place]) 09/17/2017  . 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 Malignant neoplasm of upper-inner quadrant of left breast in female, estrogen receptor positive ([place]) 07/27/2017  . Genetic testing 03/25/2017  . Malignant 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, estrogen receptor positive ([place]) 01/23/2017  . 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

## 2019-05-18 ENCOUNTER — Encounter (HOSPITAL_BASED_OUTPATIENT_CLINIC_OR_DEPARTMENT_OTHER): Payer: Self-pay

## 2019-05-18 ENCOUNTER — Other Ambulatory Visit: Payer: Self-pay

## 2019-05-18 DIAGNOSIS — C50912 Malignant neoplasm of unspecified site of left female breast: Secondary | ICD-10-CM | POA: Diagnosis not present

## 2019-05-19 ENCOUNTER — Ambulatory Visit: Payer: 59 | Admitting: Physical Therapy

## 2019-05-19 ENCOUNTER — Encounter: Payer: Self-pay | Admitting: Physical Therapy

## 2019-05-19 DIAGNOSIS — M25612 Stiffness of left shoulder, not elsewhere classified: Secondary | ICD-10-CM | POA: Diagnosis not present

## 2019-05-19 DIAGNOSIS — M25512 Pain in left shoulder: Secondary | ICD-10-CM

## 2019-05-19 DIAGNOSIS — Z483 Aftercare following surgery for neoplasm: Secondary | ICD-10-CM

## 2019-05-19 MED FILL — LETROZOLE 2.5 MG TABS: 2.5 | 90 days supply | Qty: 90 | Fill #1

## 2019-05-19 NOTE — Therapy (Signed)
[place], [place], [address] Phone: [phone]   Fax:  [phone]  Physical Therapy Treatment  Patient Details  Name: Mackenzie [name] MRN: [id number] Date of Birth: 15-Sep-1970 Referring Provider (PT): Dillingham   Encounter Date: 05/19/2019  PT End of Session - 05/19/19 1553    Visit Number  15    Number of Visits  17    Date for PT Re-Evaluation  05/24/19    PT Start Time  1513   pt arrived late   PT Stop Time  1552    PT Time Calculation (min)  39 min    Activity Tolerance  Patient tolerated treatment well    Behavior During Therapy  [facility] for tasks assessed/performed       Past Medical History:  Diagnosis Date  . Abnormal glucose 2018  . 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 Allergic rhinitis 2012  . Anemia 01/27/2017   prior to starting chemotherapy  . Breast cancer ([place]) 01/14/2017   Left breast  . 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 Fatty liver   . Hot flashes 03/2017  . Hyperlipidemia 03/20/2016  . Morbid obesity with body mass index (BMI) of 40.0 to 49.9 ([place]) 09/17/2017  . NCGS (non-celiac gluten sensitivity)   . Pre-diabetes   . Seasonal allergies 2012   seasonal allergies causes allergic rhinitis and itchy, dry eyes per pt  . Vitamin D deficiency 05/2015    Past Surgical History:  Procedure Laterality Date  . BREAST RECONSTRUCTION WITH PLACEMENT OF TISSUE EXPANDER AND FLEX HD (ACELLULAR HYDRATED DERMIS) Left 08/27/2017   Procedure: LEFT BREAST RECONSTRUCTION WITH PLACEMENT OF TISSUE EXPANDER AND FLEX HD;  Surgeon: Wallace Going, DO;  Location: [place];  Service: Plastics;  Laterality: Left;  . CESAREAN SECTION     x2  . LATISSIMUS FLAP TO BREAST Left 11/03/2018  . LATISSIMUS FLAP TO BREAST Left 11/03/2018   Procedure: LATISSIMUS FLAP TO LEFT BREAST;  Surgeon: Wallace Going, DO;  Location: [place];  Service:  Plastics;  Laterality: Left;  Marland Kitchen MASTECTOMY MODIFIED RADICAL Left 08/27/2017  . MASTECTOMY MODIFIED RADICAL Left 08/27/2017   Procedure: LEFT MODIFIED RADICAL MASTECTOMY;  Surgeon: Erroll Luna, MD;  Location: [place];  Service: General;  Laterality: Left;  Marland Kitchen MASTOPEXY Right 03/03/2019   Procedure: RIGHT BREAST MASTOPEXY/REDUCTION;  Surgeon: Wallace Going, DO;  Location: [place];  Service: Plastics;  Laterality: Right;  3 hours, please  . PORT-A-CATH REMOVAL Right 08/27/2017   Procedure: REMOVAL PORT-A-CATH RIGHT CHEST;  Surgeon: Erroll Luna, MD;  Location: [place];  Service: General;  Laterality: Right;  . PORTACATH PLACEMENT Right 01/28/2017   Procedure: INSERTION PORT-A-CATH WITH ULTRASOUND;  Surgeon: Erroll Luna, MD;  Location: [place];  Service: General;  Laterality: Right;  . REMOVAL OF TISSUE EXPANDER AND PLACEMENT OF IMPLANT Left 03/03/2019   Procedure: REMOVAL OF TISSUE EXPANDER AND PLACEMENT OF EXPANDER;  Surgeon: Wallace Going, DO;  Location: [place];  Service: Plastics;  Laterality: Left;  . TISSUE EXPANDER PLACEMENT Left 11/03/2018   Procedure: PLACEMENT OF TISSUE EXPANDER LEFT BREAST;  Surgeon: Wallace Going, DO;  Location: [place];  Service: Plastics;  Laterality: Left;  Total case time is 3.5 hours  . TUBAL LIGATION Bilateral 01/21/2011    There were no vitals filed for this visit.  Subjective Assessment - 05/19/19 1514    Subjective  My  shoulder feels good. I got my sleeve but I think it is too long.    Pertinent History  Left breast cancer diagnosed 01/10/17 by mammogram and needle biopsy. Then had neo-adjuvant chemo starting 02/10/17 until 06/26/17; had left mastectomy 08/27/17 with ALND and immediate expander placed. Radiation completed. Expander removed and replaced on May 27, 19 b/c it did not get expanded enough prior to radiation, also had lat flap 11/02/17. Had expander removed and replaced again on  03/03/19 due to a small leak, pt had reduction on R breast at this time    Patient Stated Goals  to get strength back and ROM, be able to unhook bra easily    Currently in Pain?  No/denies    Pain Score  0-No pain                       [facility] Adult PT Treatment/Exercise - 05/19/19 0001      Manual Therapy   Soft tissue mobilization  in supine to Lt pec muscle and along L lateral trunk in area of tightness     Passive ROM  to Lt shoulder in direction of abduction, flexion, and D2 with prolonged holds while performing STM             PT Education - 05/19/19 1557    Education Details  post op breast exercises- educated to do these after reconstruction as soon as Dr. gives clearance to do so    Person(s) Educated  Patient    Methods  Explanation;Handout    Comprehension  Verbalized understanding          PT Long Term Goals - 05/19/19 1517      PT LONG TERM GOAL #1   Title  Pt will demonstrate 160 degrees of left shoulder flexion to allow her to reach overhead.    Baseline  137, 04/26/19- 141, 05/19/19- 150    Time  4    Period  Weeks    Status  On-going      PT LONG TERM GOAL #2   Title  Pt will demonstrate 160 degrees of left shoulder abduction to allow her to reach out to the side    Baseline  135, 04/26/19- 160    Time  4    Period  Weeks    Status  Achieved      PT LONG TERM GOAL #3   Title  Pt will report an 80% improvement in pain and tightness in L axilla to allow pt to move LUE without discomfort.    Baseline  04/26/19- 60% improved, 05/19/19- 75% improvement    Time  4    Period  Weeks    Status  On-going      PT LONG TERM GOAL #4   Title  Pt will be independent with a home exercise program for continued strengthening and stretching    Time  4    Period  Weeks    Status  On-going      PT LONG TERM GOAL #5   Title  Pt will report a 50% improvement in back pain in area of lat flap when walking to allow improved comfort    Baseline   04/26/19- 0% improvement, 05/19/19- 80% improved    Time  4    Period  Weeks    Status  Achieved            Plan - 05/19/19 1554    Clinical Impression Statement  Decided not to instruct pt in Strength ABC program today since she will not be back again to assess indep until after her surgery in Jan. Assessed pt's progress towards goals today and spent session on manual therapy and decreasing tightness in left axilla and pec. Will follow up with pt in Jan 2021 after she has healed and is cleared for exercise. Her surgery is Dec 16.    Rehab Potential  Good    Clinical Impairments Affecting Rehab Potential  hx of radiation    PT Frequency  2x / week    PT Duration  4 weeks    PT Treatment/Interventions  ADLs/Self Care Home Management;Therapeutic exercise;Patient/family education;Manual techniques;Manual lymph drainage;Compression bandaging;Scar mobilization;Passive range of motion;Therapeutic activities    PT Next Visit Plan  on hold until Jan 2021 when pt is cleared to return, reassess goals, instruct in strength ABC, have pt make follow up appt in Jan    PT Home Exercise Plan  Access Code: [email or web address], supine over foam roll, rockwood for LUE, wall washing, 3 way raises, corner stretch, post op stretches for after surgery of Dr. Sanjuana Mae and Agree with Plan of Care  Patient       Patient will benefit from skilled therapeutic intervention in order to improve the following deficits and impairments:  Decreased range of motion, Impaired UE functional use, Decreased strength, Other (comment), Pain, Postural dysfunction  Visit Diagnosis: Stiffness of left shoulder, not elsewhere classified  Aftercare following surgery for neoplasm  Acute pain of left shoulder     Problem List Patient Active Problem List   Diagnosis Date Noted  . Benign essential HTN 08/17/2018  . Morbid obesity with body mass index (BMI) of 40.0 to 49.9 ([place]) 09/17/2017  . Acquired absence of left breast  09/15/2017  . Malignant neoplasm of upper-inner quadrant of left breast in female, estrogen receptor positive ([place]) 07/27/2017  . Genetic testing 03/25/2017  . Malignant 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, estrogen receptor positive ([place]) 01/23/2017  . 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

## 2019-05-21 ENCOUNTER — Other Ambulatory Visit (HOSPITAL_COMMUNITY)
Admission: RE | Admit: 2019-05-21 | Discharge: 2019-05-21 | Disposition: A | Discharge status: Home / Self Care | Payer: 59 | Source: Ambulatory Visit | Attending: Plastic Surgery | Admitting: Plastic Surgery

## 2019-05-21 DIAGNOSIS — Z20828 Contact with and (suspected) exposure to other viral communicable diseases: Secondary | ICD-10-CM | POA: Insufficient documentation

## 2019-05-21 DIAGNOSIS — Z01812 Encounter for preprocedural laboratory examination: Secondary | ICD-10-CM | POA: Insufficient documentation

## 2019-05-22 LAB — NOVEL CORONAVIRUS, NAA (HOSP ORDER, SEND-OUT TO REF LAB; TAT 18-24 HRS): SARS-CoV-2, NAA: NOT DETECTED

## 2019-05-25 ENCOUNTER — Ambulatory Visit (HOSPITAL_BASED_OUTPATIENT_CLINIC_OR_DEPARTMENT_OTHER): Payer: 59 | Admitting: Anesthesiology

## 2019-05-25 ENCOUNTER — Encounter (HOSPITAL_BASED_OUTPATIENT_CLINIC_OR_DEPARTMENT_OTHER): Payer: Self-pay | Admitting: Plastic Surgery

## 2019-05-25 ENCOUNTER — Ambulatory Visit (HOSPITAL_BASED_OUTPATIENT_CLINIC_OR_DEPARTMENT_OTHER)
Admission: RE | Admit: 2019-05-25 | Discharge: 2019-05-25 | Disposition: A | Discharge status: Home / Self Care | Payer: 59 | Attending: Plastic Surgery | Admitting: Plastic Surgery

## 2019-05-25 ENCOUNTER — Other Ambulatory Visit: Payer: Self-pay

## 2019-05-25 ENCOUNTER — Encounter (HOSPITAL_BASED_OUTPATIENT_CLINIC_OR_DEPARTMENT_OTHER)
Admission: RE | Disposition: A | Discharge status: Home / Self Care | Payer: Self-pay | Source: Home / Self Care | Attending: Plastic Surgery

## 2019-05-25 DIAGNOSIS — Z7951 Long term (current) use of inhaled steroids: Secondary | ICD-10-CM | POA: Insufficient documentation

## 2019-05-25 DIAGNOSIS — C773 Secondary and unspecified malignant neoplasm of axilla and upper limb lymph nodes: Secondary | ICD-10-CM | POA: Insufficient documentation

## 2019-05-25 DIAGNOSIS — C50912 Malignant neoplasm of unspecified site of left female breast: Secondary | ICD-10-CM | POA: Diagnosis not present

## 2019-05-25 DIAGNOSIS — Z9012 Acquired absence of left breast and nipple: Secondary | ICD-10-CM | POA: Diagnosis not present

## 2019-05-25 DIAGNOSIS — E559 Vitamin D deficiency, unspecified: Secondary | ICD-10-CM | POA: Diagnosis not present

## 2019-05-25 DIAGNOSIS — Z6841 Body Mass Index (BMI) 40.0 and over, adult: Secondary | ICD-10-CM | POA: Insufficient documentation

## 2019-05-25 DIAGNOSIS — Z885 Allergy status to narcotic agent status: Secondary | ICD-10-CM | POA: Diagnosis not present

## 2019-05-25 DIAGNOSIS — J309 Allergic rhinitis, unspecified: Secondary | ICD-10-CM | POA: Diagnosis not present

## 2019-05-25 DIAGNOSIS — Z79899 Other long term (current) drug therapy: Secondary | ICD-10-CM | POA: Diagnosis not present

## 2019-05-25 DIAGNOSIS — Z79811 Long term (current) use of aromatase inhibitors: Secondary | ICD-10-CM | POA: Insufficient documentation

## 2019-05-25 DIAGNOSIS — I10 Essential (primary) hypertension: Secondary | ICD-10-CM | POA: Insufficient documentation

## 2019-05-25 DIAGNOSIS — C50812 Malignant neoplasm of overlapping sites of left female breast: Secondary | ICD-10-CM | POA: Diagnosis not present

## 2019-05-25 DIAGNOSIS — Z421 Encounter for breast reconstruction following mastectomy: Secondary | ICD-10-CM | POA: Diagnosis not present

## 2019-05-25 DIAGNOSIS — C50212 Malignant neoplasm of upper-inner quadrant of left female breast: Secondary | ICD-10-CM | POA: Insufficient documentation

## 2019-05-25 HISTORY — PX: REMOVAL OF TISSUE EXPANDER AND PLACEMENT OF IMPLANT: SHX6457

## 2019-05-25 SURGERY — REMOVAL, TISSUE EXPANDER, BREAST, WITH IMPLANT INSERTION
Anesthesia: General | Site: Breast | Laterality: Left

## 2019-05-25 MED ORDER — CHLORHEXIDINE GLUCONATE CLOTH 2 % EX PADS: 6.0000 | MEDICATED_PAD | Freq: Once | CUTANEOUS | Status: DC

## 2019-05-25 MED ORDER — CEFAZOLIN SODIUM-DEXTROSE 2-4 GM/100ML-% IV SOLN
INTRAVENOUS | Status: AC
  Filled 2019-05-25: qty 100

## 2019-05-25 MED ORDER — MIDAZOLAM HCL 2 MG/2ML IJ SOLN
INTRAMUSCULAR | Status: AC
  Filled 2019-05-25: qty 2

## 2019-05-25 MED ORDER — DEXAMETHASONE SODIUM PHOSPHATE 4 MG/ML IJ SOLN
INTRAMUSCULAR | Status: DC | PRN
  Administered 2019-05-25: 5 mg via INTRAVENOUS

## 2019-05-25 MED ORDER — CEFAZOLIN SODIUM-DEXTROSE 2-4 GM/100ML-% IV SOLN
2.0000 g | INTRAVENOUS | Status: AC
  Administered 2019-05-25: 12:00:00 2 g via INTRAVENOUS

## 2019-05-25 MED ORDER — FENTANYL CITRATE (PF) 100 MCG/2ML IJ SOLN
INTRAMUSCULAR | Status: DC | PRN
  Administered 2019-05-25 (×2): 50 ug via INTRAVENOUS
  Administered 2019-05-25: 100 ug via INTRAVENOUS

## 2019-05-25 MED ORDER — SODIUM CHLORIDE 0.9 % IV SOLN
INTRAVENOUS | Status: DC | PRN
  Administered 2019-05-25: 13:00:00 500 mL

## 2019-05-25 MED ORDER — PROPOFOL 10 MG/ML IV BOLUS
INTRAVENOUS | Status: DC | PRN
  Administered 2019-05-25: 200 mg via INTRAVENOUS

## 2019-05-25 MED ORDER — FENTANYL CITRATE (PF) 100 MCG/2ML IJ SOLN
INTRAMUSCULAR | Status: AC
  Filled 2019-05-25: qty 2

## 2019-05-25 MED ORDER — LIDOCAINE HCL (CARDIAC) PF 100 MG/5ML IV SOSY
PREFILLED_SYRINGE | INTRAVENOUS | Status: DC | PRN
  Administered 2019-05-25: 100 mg via INTRAVENOUS

## 2019-05-25 MED ORDER — LACTATED RINGERS IV SOLN: INTRAVENOUS | Status: DC

## 2019-05-25 MED ORDER — MIDAZOLAM HCL 5 MG/5ML IJ SOLN
INTRAMUSCULAR | Status: DC | PRN
  Administered 2019-05-25: 2 mg via INTRAVENOUS

## 2019-05-25 MED ORDER — LIDOCAINE-EPINEPHRINE 1 %-1:100000 IJ SOLN
INTRAMUSCULAR | Status: DC | PRN
  Administered 2019-05-25: 5 mL

## 2019-05-25 MED ORDER — SODIUM CHLORIDE 0.9 % IV SOLN
INTRAVENOUS | Status: AC
  Filled 2019-05-25: qty 500000

## 2019-05-25 MED ORDER — ONDANSETRON HCL 4 MG/2ML IJ SOLN
INTRAMUSCULAR | Status: DC | PRN
  Administered 2019-05-25: 4 mg via INTRAVENOUS

## 2019-05-25 SURGICAL SUPPLY — 78 items
ADH SKN CLS APL DERMABOND .7 (GAUZE/BANDAGES/DRESSINGS) ×1
APL PRP STRL LF DISP 70% ISPRP (MISCELLANEOUS)
BAG DECANTER FOR FLEXI CONT (MISCELLANEOUS) ×2 IMPLANT
BINDER BREAST 3XL (GAUZE/BANDAGES/DRESSINGS) ×1 IMPLANT
BINDER BREAST LRG (GAUZE/BANDAGES/DRESSINGS) IMPLANT
BINDER BREAST MEDIUM (GAUZE/BANDAGES/DRESSINGS) IMPLANT
BINDER BREAST XLRG (GAUZE/BANDAGES/DRESSINGS) IMPLANT
BINDER BREAST XXLRG (GAUZE/BANDAGES/DRESSINGS) IMPLANT
BIOPATCH RED 1 DISK 7.0 (GAUZE/BANDAGES/DRESSINGS) IMPLANT
BLADE SURG 15 STRL LF DISP TIS (BLADE) ×1 IMPLANT
BLADE SURG 15 STRL SS (BLADE) ×2
BNDG GAUZE ELAST 4 BULKY (GAUZE/BANDAGES/DRESSINGS) ×2 IMPLANT
CANISTER SUCT 1200ML W/VALVE (MISCELLANEOUS) ×2 IMPLANT
CHLORAPREP W/TINT 26 (MISCELLANEOUS) ×1 IMPLANT
COVER BACK TABLE REUSABLE LG (DRAPES) ×2 IMPLANT
COVER MAYO STAND REUSABLE (DRAPES) ×2 IMPLANT
COVER WAND RF STERILE (DRAPES) IMPLANT
DECANTER SPIKE VIAL GLASS SM (MISCELLANEOUS) IMPLANT
DERMABOND ADVANCED (GAUZE/BANDAGES/DRESSINGS) ×1
DERMABOND ADVANCED .7 DNX12 (GAUZE/BANDAGES/DRESSINGS) IMPLANT
DRAIN CHANNEL 19F RND (DRAIN) IMPLANT
DRAPE LAPAROSCOPIC ABDOMINAL (DRAPES) ×2 IMPLANT
DRSG PAD ABDOMINAL 8X10 ST (GAUZE/BANDAGES/DRESSINGS) ×3 IMPLANT
ELECT BLADE 4.0 EZ CLEAN MEGAD (MISCELLANEOUS) ×2
ELECT BLADE 6.5 EXT (BLADE) ×1 IMPLANT
ELECT COATED BLADE 2.86 ST (ELECTRODE) ×2 IMPLANT
ELECT REM PT RETURN 9FT ADLT (ELECTROSURGICAL) ×2
ELECTRODE BLDE 4.0 EZ CLN MEGD (MISCELLANEOUS) ×1 IMPLANT
ELECTRODE REM PT RTRN 9FT ADLT (ELECTROSURGICAL) ×1 IMPLANT
EVACUATOR SILICONE 100CC (DRAIN) IMPLANT
GAUZE SPONGE 4X4 12PLY STRL LF (GAUZE/BANDAGES/DRESSINGS) IMPLANT
GLOVE BIO SURGEON STRL SZ 6.5 (GLOVE) ×5 IMPLANT
GLOVE BIO SURGEON STRL SZ7 (GLOVE) ×1 IMPLANT
GLOVE BIOGEL PI IND STRL 7.0 (GLOVE) IMPLANT
GLOVE BIOGEL PI INDICATOR 7.0 (GLOVE) ×1
GLOVE EXAM NITRILE MD LF STRL (GLOVE) ×1 IMPLANT
GLOVE SURG SYN 7.5  E (GLOVE) ×1
GLOVE SURG SYN 7.5 E (GLOVE) ×1 IMPLANT
GLOVE SURG SYN 7.5 PF PI (GLOVE) IMPLANT
GOWN STRL REUS W/ TWL LRG LVL3 (GOWN DISPOSABLE) ×2 IMPLANT
GOWN STRL REUS W/ TWL XL LVL3 (GOWN DISPOSABLE) IMPLANT
GOWN STRL REUS W/TWL LRG LVL3 (GOWN DISPOSABLE) ×4
GOWN STRL REUS W/TWL XL LVL3 (GOWN DISPOSABLE) ×2
IMPL BREAST SILICONE 750CC (Breast) IMPLANT
IMPLANT BREAST SILICONE 750CC (Breast) ×2 IMPLANT
IV NS 1000ML (IV SOLUTION)
IV NS 1000ML BAXH (IV SOLUTION) IMPLANT
NDL HYPO 25X1 1.5 SAFETY (NEEDLE) ×1 IMPLANT
NDL SAFETY ECLIPSE 18X1.5 (NEEDLE) ×1 IMPLANT
NEEDLE HYPO 18GX1.5 SHARP (NEEDLE) ×2
NEEDLE HYPO 25X1 1.5 SAFETY (NEEDLE) ×2 IMPLANT
PACK BASIN DAY SURGERY FS (CUSTOM PROCEDURE TRAY) ×2 IMPLANT
PENCIL SMOKE EVACUATOR (MISCELLANEOUS) ×2 IMPLANT
PIN SAFETY STERILE (MISCELLANEOUS) IMPLANT
SIZER BREAST REUSE 750CC (SIZER) ×2
SIZER BRST REUSE P6.6 750CC (SIZER) IMPLANT
SLEEVE SCD COMPRESS KNEE MED (MISCELLANEOUS) ×2 IMPLANT
SPONGE LAP 18X18 RF (DISPOSABLE) ×4 IMPLANT
STRIP SUTURE WOUND CLOSURE 1/2 (SUTURE) ×1 IMPLANT
SUT MNCRL AB 3-0 PS2 18 (SUTURE) IMPLANT
SUT MNCRL AB 4-0 PS2 18 (SUTURE) ×3 IMPLANT
SUT MON AB 3-0 SH 27 (SUTURE) ×2
SUT MON AB 3-0 SH27 (SUTURE) ×1 IMPLANT
SUT MON AB 5-0 PS2 18 (SUTURE) ×1 IMPLANT
SUT PDS 3-0 CT2 (SUTURE)
SUT PDS AB 2-0 CT2 27 (SUTURE) IMPLANT
SUT PDS II 3-0 CT2 27 ABS (SUTURE) IMPLANT
SUT SILK 3 0 PS 1 (SUTURE) IMPLANT
SUT VIC AB 3-0 SH 27 (SUTURE) ×2
SUT VIC AB 3-0 SH 27X BRD (SUTURE) ×1 IMPLANT
SUT VICRYL 4-0 PS2 18IN ABS (SUTURE) ×2 IMPLANT
SYR BULB IRRIGATION 50ML (SYRINGE) ×2 IMPLANT
SYR CONTROL 10ML LL (SYRINGE) ×1 IMPLANT
TOWEL GREEN STERILE FF (TOWEL DISPOSABLE) ×4 IMPLANT
TRAY DSU PREP LF (CUSTOM PROCEDURE TRAY) ×1 IMPLANT
TUBE CONNECTING 20X1/4 (TUBING) ×2 IMPLANT
UNDERPAD 30X36 HEAVY ABSORB (UNDERPADS AND DIAPERS) ×4 IMPLANT
YANKAUER SUCT BULB TIP NO VENT (SUCTIONS) ×2 IMPLANT

## 2019-05-25 NOTE — Transfer of Care (Signed)
Immediate Anesthesia Transfer of Care Note  Patient: Mackenzie [name]  Procedure(s) Performed: LEFT BREAST REMOVAL OF TISSUE EXPANDER AND PLACEMENT OF IMPLANT (Left Breast)  Patient Location: PACU  Anesthesia Type:General  Level of Consciousness: awake, alert , oriented and drowsy  Airway & Oxygen Therapy: Patient Spontanous Breathing and Patient connected to face mask oxygen  Post-op Assessment: Report given to RN and Post -op Vital signs reviewed and stable  Post vital signs: Reviewed and stable  Last Vitals:  Vitals Value Taken Time  BP    Temp    Pulse 84 05/25/19 1401  Resp    SpO2 98 % 05/25/19 1401  Vitals shown include unvalidated device data.  Last Pain:  Vitals:   05/25/19 1121  TempSrc: Tympanic  PainSc: 0-No pain      Patients Stated Pain Goal: 3 ([id number])  Complications: No apparent anesthesia complications

## 2019-05-25 NOTE — Anesthesia Preprocedure Evaluation (Addendum)
Anesthesia Evaluation  Patient identified by MRN, date of birth, ID band Patient awake    Reviewed: Allergy & Precautions, NPO status , Patient's Chart, lab work & pertinent test results  Airway Mallampati: III  TM Distance: >3 FB Neck ROM: Full    Dental no notable dental hx. (+) Teeth Intact   Pulmonary neg pulmonary ROS,    Pulmonary exam normal breath sounds clear to auscultation       Cardiovascular hypertension, Pt. on medications Normal cardiovascular exam Rhythm:Regular Rate:Normal     Neuro/Psych negative neurological ROS  negative psych ROS   GI/Hepatic negative GI ROS, Neg liver ROS,   Endo/Other  diabetes, Well ControlledMorbid obesityAcquired absence of left breast Left Breast Ca  Renal/GU negative Renal ROS     Musculoskeletal negative musculoskeletal ROS (+)   Abdominal (+) + obese,   Peds  Hematology  (+) anemia ,   Anesthesia Other Findings   Reproductive/Obstetrics                             Anesthesia Physical  Anesthesia Plan  ASA: III  Anesthesia Plan: General   Post-op Pain Management:    Induction: Intravenous  PONV Risk Score and Plan: 4 or greater and Ondansetron and Treatment may vary due to age or medical condition  Airway Management Planned: Oral ETT  Additional Equipment:   Intra-op Plan:   Post-operative Plan: Extubation in OR  Informed Consent: I have reviewed the patients History and Physical, chart, labs and discussed the procedure including the risks, benefits and alternatives for the proposed anesthesia with the patient or authorized representative who has indicated his/her understanding and acceptance.       Plan Discussed with: CRNA and Surgeon  Anesthesia Plan Comments:         Anesthesia Quick Evaluation

## 2019-05-25 NOTE — Discharge Instructions (Signed)
INSTRUCTIONS FOR AFTER SURGERY   You will likely have some questions about what to expect following your operation.  The following information will help you and your family understand what to expect when you are discharged from the hospital.  Following these guidelines will help ensure a smooth recovery and reduce risks of complications.  Postoperative instructions include information on: diet, wound care, medications and physical activity.  AFTER SURGERY Expect to go home after the procedure.  In some cases, you may need to spend one night in the hospital for observation.  DIET This surgery does not require a specific diet.  However, I have to mention that the healthier you eat the better your body can start healing. It is important to increasing your protein intake.  This means limiting the foods with added sugar.  Focus on fruits and vegetables and some meat.  If you have any liposuction during your procedure be sure to drink water.  If your urine is bright yellow, then it is concentrated, and you need to drink more water.  As a general rule after surgery, you should have 8 ounces of water every hour while awake.  If you find you are persistently nauseated or unable to take in liquids let us know.  NO TOBACCO USE or EXPOSURE.  This will slow your healing process and increase the risk of a wound.  WOUND CARE If you don't have a drain: You can shower the day after surgery.  Use fragrance free soap.  Dial, Dove, Ivory and Cetaphil are usually mild on the skin.  If you have steri-strips / tape directly attached to your skin leave them in place. It is OK to get these wet.  No baths, pools or hot tubs for two weeks. We close your incision to leave the smallest and best-looking scar. No ointment or creams on your incisions until given the go ahead.  Especially not Neosporin (Too many skin reactions with this one).  A few weeks after surgery you can use Mederma and start massaging the scar. We ask you to  wear your binder or sports bra for the first 6 weeks around the clock, including while sleeping. This provides added comfort and helps reduce the fluid accumulation at the surgery site.  ACTIVITY No heavy lifting until cleared by the doctor.  It is OK to walk and climb stairs. In fact, moving your legs is very important to decrease your risk of a blood clot.  It will also help keep you from getting deconditioned.  Every 1 to 2 hours get up and walk for 5 minutes. This will help with a quicker recovery back to normal.  Let pain be your guide so you don't do too much.  NO, you cannot do the spring cleaning and don't plan on taking care of anyone else.  This is your time for TLC.   WORK Everyone returns to work at different times. As a rough guide, most people take at least 1 - 2 weeks off prior to returning to work. If you need documentation for your job, bring the forms to your postoperative follow up visit.  DRIVING Arrange for someone to bring you home from the hospital.  You may be able to drive a few days after surgery but not while taking any narcotics or valium.  BOWEL MOVEMENTS Constipation can occur after anesthesia and while taking pain medication.  It is important to stay ahead for your comfort.  We recommend taking Milk of Magnesia (2 tablespoons; twice   a day) while taking the pain pills.  SEROMA This is fluid your body tried to put in the surgical site.  This is normal but if it creates excessive pain and swelling let us know.  It usually decreases in a few weeks.  MEDICATIONS and PAIN CONTROL At your preoperative visit for you history and physical you were given the following medications: 1. An antibiotic: Start this medication when you get home and take according to the instructions on the bottle. 2. Zofran 4 mg:  This is to treat nausea and vomiting.  You can take this every 6 hours as needed and only if needed. 3. Norco (hydrocodone/acetaminophen) 5/325 mg:  This is only to be  used after you have taken the motrin or the tylenol. Every 8 hours as needed. Over the counter Medication to take: 4. Ibuprofen (Motrin) 600 mg:  Take this every 6 hours.  If you have additional pain then take 500 mg of the tylenol.  Only take the Norco after you have tried these two. 5. Miralax or stool softener of choice: Take this according to the bottle if you take the Norco.  WHEN TO CALL Call your surgeon's office if any of the following occur: . Fever 101 degrees F or greater . Excessive bleeding or fluid from the incision site. . Pain that increases over time without aid from the medications . Redness, warmth, or pus draining from incision sites . Persistent nausea or inability to take in liquids . Severe misshapen area that underwent the operation.   Post Anesthesia Home Care Instructions  Activity: Get plenty of rest for the remainder of the day. A responsible individual must stay with you for 24 hours following the procedure.  For the next 24 hours, DO NOT: -Drive a car -Operate machinery -Drink alcoholic beverages -Take any medication unless instructed by your physician -Make any legal decisions or sign important papers.  Meals: Start with liquid foods such as gelatin or soup. Progress to regular foods as tolerated. Avoid greasy, spicy, heavy foods. If nausea and/or vomiting occur, drink only clear liquids until the nausea and/or vomiting subsides. Call your physician if vomiting continues.  Special Instructions/Symptoms: Your throat may feel dry or sore from the anesthesia or the breathing tube placed in your throat during surgery. If this causes discomfort, gargle with warm salt water. The discomfort should disappear within 24 hours.  If you had a scopolamine patch placed behind your ear for the management of post- operative nausea and/or vomiting:  1. The medication in the patch is effective for 72 hours, after which it should be removed.  Wrap patch in a tissue and  discard in the trash. Wash hands thoroughly with soap and water. 2. You may remove the patch earlier than 72 hours if you experience unpleasant side effects which may include dry mouth, dizziness or visual disturbances. 3. Avoid touching the patch. Wash your hands with soap and water after contact with the patch.     

## 2019-05-25 NOTE — Anesthesia Procedure Notes (Signed)
Procedure Name: LMA Insertion Date/Time: 05/25/2019 12:03 PM Performed by: Willa Frater, CRNA Pre-anesthesia Checklist: Patient identified, Emergency Drugs available, Suction available and Patient being monitored Patient Re-evaluated:Patient Re-evaluated prior to induction Oxygen Delivery Method: Circle system utilized Preoxygenation: Pre-oxygenation with 100% oxygen Induction Type: IV induction Ventilation: Mask ventilation without difficulty LMA: LMA inserted LMA Size: 4.0 Number of attempts: 1 Airway Equipment and Method: Bite block Placement Confirmation: positive ETCO2 Tube secured with: Tape Dental Injury: Teeth and Oropharynx as per pre-operative assessment

## 2019-05-25 NOTE — Op Note (Signed)
Op report Unilateral Breast Exchange   DATE OF OPERATION:  05/25/2019  LOCATION: [facility]  SURGICAL DIVISION: Plastic Surgery  PREOPERATIVE DIAGNOSES:  1. History of left breast cancer.  2. Acquired absence of left breast.   POSTOPERATIVE DIAGNOSES:  1. History of left breast cancer.  2. Acquired absence of left breast.   PROCEDURE:  1. Exchange of tissue expander for implant. 2. Capsulotomies for implant respositioning.  SURGEON: Gavynn Duvall Sanger Mahi Zabriskie, DO  ASSISTANT: Phoebe Sharps, PA  ANESTHESIA:  General.   COMPLICATIONS: None.   IMPLANTS: [employer] Smooth Round Ultra High Profile Gel 750cc. Ref [id number].  Serial Number [id number]  INDICATIONS FOR PROCEDURE:  The patient, Mackenzie [name], is a 48 y.o. female born on 10-11-1970, is here for treatment for further treatment after a mastectomy and placement of a tissue expander. She now presents for exchange of her expander for an implant.  She requires capsulotomies to better position the implant. MRN: [id number]  CONSENT:  Informed consent was obtained directly from the patient. Risks, benefits and alternatives were fully discussed. Specific risks including but not limited to bleeding, infection, hematoma, seroma, scarring, pain, implant infection, implant extrusion, capsular contracture, asymmetry, wound healing problems, and need for further surgery were all discussed. The patient did have an ample opportunity to have her questions answered to her satisfaction.   DESCRIPTION OF PROCEDURE:  The patient was taken to the operating room. SCDs were placed and IV antibiotics were given. The patient's chest was prepped and draped in a sterile fashion. A time out was performed and the implants to be used were identified.  One percent Xylocaine with epinephrine was used to infiltrate the area.   The old mastectomy scar at the inferior portion of the latissimus muscle was opened.  The inferior mastectomy  flap was raised over the latissimus muscle. The muscle was split to expose the tissue expander which was removed. Inspection of the pocket showed a normal healthy capsule.   Circumferential capsulotomies and several areas of capsulectomies.were performed to allow for breast pocket expansion.  Measurements were made to confirm adequate pocket size for the implant dimensions.  Hemostasis was ensured with electrocautery.  The pocket was irrigated with antibiotic solution.  New gloves were placed.  The implant was placed in the pocket and oriented appropriately. The latissimus muscle and capsule on the anterior surface were re-closed with a 3-0 running Monocryl suture. The remaining skin was closed with 4-0 Monocryl deep dermal and 5-0 Monocryl subcuticular stitches.  Dermabond was applied.  A breast binder and ABD was applied.  The patient was allowed to wake from anesthesia and taken to the recovery room in satisfactory condition.   The advanced practice practitioner (APP) assisted throughout the case.  The APP was essential in retraction and counter traction when needed to make the case progress smoothly.  This retraction and assistance made it possible to see the tissue plans for the procedure.  The assistance was needed for blood control, tissue re-approximation and assisted with closure of the incision site.  The [place] was signed into law in 2016 which includes the topic of electronic health records.  This provides immediate access to information in MyChart.  This includes consultation notes, operative notes, office notes, lab results and pathology reports.  If you have any questions about what you read please let us know at your next visit or call the office.  We are right here with you.

## 2019-05-25 NOTE — Interval H&P Note (Signed)
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  The various methods of treatment have been discussed with the patient and family. After consideration of risks, benefits and other options for treatment, the patient has consented to  Procedure(s): REMOVAL OF TISSUE EXPANDER AND PLACEMENT OF IMPLANT (Left) as a surgical intervention.  The patient's history has been reviewed, patient examined, no change in status, stable for surgery.  I have reviewed the patient's chart and labs.  Questions were answered to the patient's satisfaction.     Loel Lofty Guillermo Nehring

## 2019-05-25 NOTE — Anesthesia Postprocedure Evaluation (Signed)
Anesthesia Post Note  Patient: Mackenzie [name]  Procedure(s) Performed: LEFT BREAST REMOVAL OF TISSUE EXPANDER AND PLACEMENT OF IMPLANT (Left Breast)     Patient location during evaluation: PACU Anesthesia Type: General Level of consciousness: awake and alert and oriented Pain management: pain level controlled Vital Signs Assessment: post-procedure vital signs reviewed and stable Respiratory status: spontaneous breathing, nonlabored ventilation and respiratory function stable Cardiovascular status: blood pressure returned to baseline and stable Postop Assessment: no apparent nausea or vomiting Anesthetic complications: no    Last Vitals:  Vitals:   05/25/19 1415 05/25/19 1430  BP: (!) 142/87 139/81  Pulse: 80 71  Resp: (!) 23 17  Temp:    SpO2: 100% 100%    Last Pain:  Vitals:   05/25/19 1430  TempSrc:   PainSc: 0-No pain                 Huntley Knoop A.

## 2019-05-26 ENCOUNTER — Encounter: Payer: Self-pay | Admitting: *Deleted

## 2019-05-27 ENCOUNTER — Telehealth: Payer: Self-pay | Admitting: *Deleted

## 2019-05-27 NOTE — Telephone Encounter (Signed)
Mackenzie [name]; Called patient to confirm her appointments on Monday 05/30/19 starting at 10 am.  Instructed patient to bring all [name] drug bottles and diaries with her and do not take any ribociclib until instructed in clinic. Informed patient she does not need to fast for this visit. Instructed patient to stop taking ondansetron, if she is taking it post-op, at least 30 hours prior to visit on Monday. Ondansetron is a prohibited medication on [name] due to potential interaction with Ribociclib. It must be held for at least 5 half lives before resuming Ribociclib.  According to Up to Date, the half life of ondansetron is 3 to 6 hours. Asked patient to please return call if any questions or if she cannot make her appointment on Monday as scheduled.  Foye Spurling, BSN, RN Clinical Research Nurse 05/27/2019 1:32 PM

## 2019-05-29 NOTE — Progress Notes (Signed)
[facility]  [phone] [phone]    ID: Mackenzie [name] DOB: December 26, 1970  MR#: [id number]  BHA#:[id number]  Patient Care Team: Willey Blade, MD as PCP - General (Internal Medicine) Magrinat, [name], MD as Consulting Physician (Oncology) Erroll Luna, MD as Consulting Physician (General Surgery) Kyung Rudd, MD as Consulting Physician (Radiation Oncology) Dillingham, Loel Lofty, DO as Attending Physician (Plastic Surgery) Cathlean Cower, RN as Registered Nurse OTHER MD:    CHIEF COMPLAINT: Estrogen receptor positive breast cancer  CURRENT TREATMENT: letrozole, goserelin; on [name] trial (ribociclib arm)   INTERVAL HISTORY: Mackenzie [name] returns today for follow-up and treatment of her estrogen receptor positive breast cancer.  Her study nurse was also present during the visit  Mackenzie [name] is on study with [name]--Ribociclb and anti estrogen therapy with Letrozole daily.  She is tolerating these medications without any major side effects.  She is due for MRI liver to evaluate elevated liver function tests.  We were not able to perform MRIs before because she had an expander in place.  She just had her saline implant placed .  She had no major complications from that.  She also continues on goserelin every 4 weeks.  She is having few symptoms related to menopause.  Mackenzie [name]'s last bone density screening on 03/16/2018, showed a T-score of -0.7, which is considered normal.     REVIEW OF SYSTEMS: [place] I will not be able to return to her unit at work because it is now a Covid unit.  She has been offered a couple of alternatives.  Of course right now she is very restricted because she had her saline implant placed on the left on 05/25/2019.  She is not satisfied because the size of the breast is very asymmetric.  She is not having pain.  She does have decreased feeling on the left side but does have some feeling.  She and her family are keeping  appropriate pandemic precautions.  A detailed review of systems today was otherwise noncontributory   HISTORY OF CURRENT ILLNESS: From the original intake note:  The patient herself noted some changes in her left breast late July 2018, she says, and eventually brought this to medical attention so that on 01/09/2017 she underwent bilateral diagnostic mammography with tomography and left breast ultrasonography at the breast Center. This found the breast density to be category C. In the upper inner quadrant of the left breast there was an area of asymmetry and there were malignant type calcifications involving all 4 quadrants. On exam there is firmness and palpable thickening in the anterior left breast with skin dimpling. Ultrasonography found at the 9:30 o'clock radiant 3 cm from the nipple a 2.7 cm mass and in the left axilla for abnormal lymph nodes largest of which measured 2.7 cm.  Biopsy of the left breast 9:30 o'clock mass 80 01/26/2017 showed ([id number]) invasive ductal carcinoma, with extracellular mucin, grade 1 or 2. In the lower outer left breast is separated biopsy the same day showed ductal carcinoma in situ. One of the 4 lymph nodes involved was positive for metastatic carcinoma. Prognostic panel on the invasive disease showed it to be estrogen receptor 100% positive, progesterone receptor 20% positive, both with strong staining intensity, with an MIB-1 of 20%, and no HER-2 amplification with a signals ratio 1.38 and the number per cell 2.90.  On 01/22/2017 the patient underwent bilateral breast MRI. This showed no involvement of the right breast, but in the left breast there was a  masslike and non-masslike enhancement involving all quadrants, with skin swelling but no abnormal enhancement of the skin or nipple areolar complex or pectoralis muscle. The mass could not be clearly measured but spanned approximately 12 cm. There was bulky left axillary lymphadenopathy, with the largest lymph  node measuring up to 3.4 cm.  The patient's subsequent history is as detailed below.   PAST MEDICAL HISTORY: Past Medical History:  Diagnosis Date  . Abnormal glucose 2018  . 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 Allergic rhinitis 2012  . Anemia 01/27/2017   prior to starting chemotherapy  . Breast cancer ([place]) 01/14/2017   Left breast  . 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 Fatty liver   . Hot flashes 03/2017  . Hyperlipidemia 03/20/2016  . Morbid obesity with body mass index (BMI) of 40.0 to 49.9 ([place]) 09/17/2017  . NCGS (non-celiac gluten sensitivity)   . Pre-diabetes   . Seasonal allergies 2012   seasonal allergies causes allergic rhinitis and itchy, dry eyes per pt  . Vitamin D deficiency 05/2015    PAST SURGICAL HISTORY: Past Surgical History:  Procedure Laterality Date  . BREAST RECONSTRUCTION WITH PLACEMENT OF TISSUE EXPANDER AND FLEX HD (ACELLULAR HYDRATED DERMIS) Left 08/27/2017   Procedure: LEFT BREAST RECONSTRUCTION WITH PLACEMENT OF TISSUE EXPANDER AND FLEX HD;  Surgeon: Wallace Going, DO;  Location: [facility];  Service: Plastics;  Laterality: Left;  . CESAREAN SECTION     x2  . LATISSIMUS FLAP TO BREAST Left 11/03/2018  . LATISSIMUS FLAP TO BREAST Left 11/03/2018   Procedure: LATISSIMUS FLAP TO LEFT BREAST;  Surgeon: Wallace Going, DO;  Location: [facility];  Service: Plastics;  Laterality: Left;  Marland Kitchen MASTECTOMY MODIFIED RADICAL Left 08/27/2017  . MASTECTOMY MODIFIED RADICAL Left 08/27/2017   Procedure: LEFT MODIFIED RADICAL MASTECTOMY;  Surgeon: Erroll Luna, MD;  Location: [facility];  Service: General;  Laterality: Left;  Marland Kitchen MASTOPEXY Right 03/03/2019   Procedure: RIGHT BREAST MASTOPEXY/REDUCTION;  Surgeon: Wallace Going, DO;  Location: [facility];  Service: Plastics;  Laterality: Right;  3 hours, please  . PORT-A-CATH REMOVAL Right 08/27/2017   Procedure: REMOVAL PORT-A-CATH RIGHT CHEST;   Surgeon: Erroll Luna, MD;  Location: [facility];  Service: General;  Laterality: Right;  . PORTACATH PLACEMENT Right 01/28/2017   Procedure: INSERTION PORT-A-CATH WITH ULTRASOUND;  Surgeon: Erroll Luna, MD;  Location: [facility];  Service: General;  Laterality: Right;  . REMOVAL OF TISSUE EXPANDER AND PLACEMENT OF IMPLANT Left 03/03/2019   Procedure: REMOVAL OF TISSUE EXPANDER AND PLACEMENT OF EXPANDER;  Surgeon: Wallace Going, DO;  Location: [facility];  Service: Plastics;  Laterality: Left;  . REMOVAL OF TISSUE EXPANDER AND PLACEMENT OF IMPLANT Left 05/25/2019   Procedure: LEFT BREAST REMOVAL OF TISSUE EXPANDER AND PLACEMENT OF IMPLANT;  Surgeon: Wallace Going, DO;  Location: [facility];  Service: Plastics;  Laterality: Left;  . TISSUE EXPANDER PLACEMENT Left 11/03/2018   Procedure: PLACEMENT OF TISSUE EXPANDER LEFT BREAST;  Surgeon: Wallace Going, DO;  Location: [facility];  Service: Plastics;  Laterality: Left;  Total case time is 3.5 hours  . TUBAL LIGATION Bilateral 01/21/2011    FAMILY HISTORY Family History  Problem Relation Age of Onset  . Breast cancer Paternal Grandmother 77       d.60s from breast cancer. Did not have treatment.  . Other Mother        d.56  from complications of surgery to remove brain tumor   The patient's father still alive at age 83. The patient's mother died with a brain tumor which the patient says was "benign". She was 56. The patient has one brother, no sisters. The only breast cancer in the family is a paternal grandmother who died from breast cancer at an unknown age. There is no history of ovarian or prostate cancer in the family.   GYNECOLOGIC HISTORY:  Patient's last menstrual period was 02/10/2017. Menarche age 83 and first live birth age 88 she is [place] P4. The patient is still having regular periods as of August 2018   SOCIAL HISTORY: (Updated 05/31/2018) Mackenzie [name] works at [employer] on  the fifth floor, [place]. She is separated from her husband (as of 05/2018), Mackenzie [name], who is a Building control surveyor.  1 daughter is married and lives in [place] and works as a Education administrator. Mc's second daughter, Mackenzie [name], lives in [place] and works in the police department. Daughters, Mackenzie [name] and Mackenzie [name] are 74 and 6, living at home. The patient has a grandson she looks after. She attends a [employer].     ADVANCED DIRECTIVES: Not in place   HEALTH MAINTENANCE: Social History   Tobacco Use  . Smoking status: Never Smoker  . Smokeless tobacco: Never Used  Substance Use Topics  . Alcohol use: Yes    Comment: social  . Drug use: No    Colonoscopy: Never  PAP: December 2017  Bone density: Never   Allergies  Allergen Reactions  . Dilaudid [Hydromorphone] Itching    Current Outpatient Medications  Medication Sig Dispense Refill  . acetaminophen (TYLENOL) 500 MG tablet Take 1,000 mg by mouth every 6 (six) hours as needed (for pain/headaches.).     Marland Kitchen B Complex Vitamins (VITAMIN B COMPLEX PO) Take 1 capsule by mouth 2 (two) times daily.     . budesonide-formoterol (SYMBICORT) 80-4.5 MCG/ACT inhaler Inhale 2 puffs into the lungs every morning.     . Calcium Carb-Cholecalciferol (CALCIUM 600+D3 PO) Take 1 tablet by mouth 2 (two) times daily.     . cephALEXin (KEFLEX) 500 MG capsule Take 500 mg by mouth 4 (four) times daily.    . cetirizine (ZYRTEC) 10 MG tablet Take 10 mg by mouth daily.      . Cholecalciferol (VITAMIN D-3) 5000 units TABS Take 5,000 Units by mouth 2 (two) times daily.     . diazepam (VALIUM) 2 MG tablet Take 2 mg by mouth every 6 (six) hours as needed for muscle spasms.    Marland Kitchen gabapentin (NEURONTIN) 300 MG capsule Take 1 capsule (300 mg total) by mouth at bedtime. (Patient taking differently: Take 300 mg by mouth every other day. ) 90 capsule 4  . HYDROcodone-acetaminophen (NORCO/VICODIN) 5-325 MG tablet Take 1 tablet by mouth every 6 (six) hours as needed for  moderate pain.    . hydroxypropyl methylcellulose / hypromellose (ISOPTO TEARS / GONIOVISC) 2.5 % ophthalmic solution Place 1 drop into both eyes daily as needed for dry eyes.     Marland Kitchen ibuprofen (ADVIL,MOTRIN) 200 MG tablet Take 800 mg by mouth every 8 (eight) hours as needed for mild pain (for pain.).     . Investigational ribociclib (KISQALI) 200 MG tablet [name] Take 2 tablets (400 mg total) by mouth daily. Take 2 tablets (433m total) by mouth daily on days 1-21.  Repeat every 28 days. 225 tablet 0  . letrozole (FEMARA) 2.5 MG tablet Take 1 tablet (2.5 mg total) by mouth  daily. 90 tablet 4  . loratadine (CLARITIN) 10 MG tablet Take 1 tablet (10 mg total) by mouth daily. (Patient taking differently: Take 10 mg by mouth daily as needed for allergies. ) 90 tablet 2  . Multiple Vitamin (MULTIVITAMIN WITH MINERALS) TABS tablet Take 1 tablet by mouth daily.     . Omega-3 Fatty Acids (FISH OIL) 1000 MG CAPS Take 1,000 mg by mouth 2 (two) times daily.     . phenylephrine (SUDAFED PE) 10 MG TABS tablet Take 10 mg by mouth every 4 (four) hours as needed.     . Probiotic Product (PROBIOTIC DAILY PO) Take 1 capsule by mouth 2 (two) times daily.     . Turmeric 450 MG CAPS Take 450 mg by mouth 2 (two) times daily.    Marland Kitchen ZINC OXIDE PO Take 1 tablet by mouth 2 (two) times a day.     No current facility-administered medications for this visit.   Facility-Administered Medications Ordered in Other Visits  Medication Dose Route Frequency Provider Last Rate Last Admin  . sodium chloride flush (NS) 0.9 % injection 10 mL  10 mL Intravenous PRN Magrinat, [name], MD   10 mL at 03/10/17 1239    OBJECTIVE: Morbidly obese African-American woman in no acute distress Vitals:   05/30/19 1127  BP: (!) 142/78  Pulse: 74  Resp: 20  Temp: 98.7 F (37.1 C)  SpO2: 99%   Wt Readings from Last 3 Encounters:  05/30/19 267 lb 12.8 oz (121.5 kg)  05/25/19 268 lb 4.8 oz (121.7 kg)  05/10/19 266 lb (120.7 kg)   Body  mass index is 43.22 kg/m.   ECOG FS: 1  Sclerae unicteric, EOMs intact Wearing a mask No cervical or supraclavicular adenopathy Lungs no rales or rhonchi Heart regular rate and rhythm Abd soft, nontender, positive bowel sounds MSK no focal spinal tenderness, no upper extremity lymphedema Neuro: nonfocal, well oriented, appropriate affect Breasts: The right breast is status post reduction mammoplasty.  The cosmetic result is good.  The left breast is status post mastectomy with latissimus flap and saline implant.  It is approximately half the size of the other breast.  There is no evidence of disease recurrence.  Both axillae are benign.   LAB RESULTS:  Appointment on 05/30/2019  Component Date Value Ref Range Status  . Sodium 05/30/2019 137  135 - 145 mmol/L Final  . Potassium 05/30/2019 3.8  3.5 - 5.1 mmol/L Final  . Chloride 05/30/2019 103  98 - 111 mmol/L Final  . CO2 05/30/2019 25  22 - 32 mmol/L Final  . Glucose, Bld 05/30/2019 101* 70 - 99 mg/dL Final  . BUN 05/30/2019 16  6 - 20 mg/dL Final  . Creatinine 05/30/2019 0.79  0.44 - 1.00 mg/dL Final  . Calcium 05/30/2019 9.0  8.9 - 10.3 mg/dL Final  . Total Protein 05/30/2019 7.3  6.5 - 8.1 g/dL Final  . Albumin 05/30/2019 3.5  3.5 - 5.0 g/dL Final  . AST 05/30/2019 23  15 - 41 U/L Final  . ALT 05/30/2019 31  0 - 44 U/L Final  . Alkaline Phosphatase 05/30/2019 106  38 - 126 U/L Final  . Total Bilirubin 05/30/2019 0.4  0.3 - 1.2 mg/dL Final  . GFR, Est Non Af Am 05/30/2019 >60  >60 mL/min Final  . GFR, Est AFR Am 05/30/2019 >60  >60 mL/min Final  . Anion gap 05/30/2019 9  5 - 15 Final   Performed at [facility], [place] [address] [place], [place] [address]  . WBC Count 05/30/2019 5.7  4.0 - 10.5 K/uL Final  . RBC 05/30/2019 4.42  3.87 - 5.11 MIL/uL Final  . Hemoglobin 05/30/2019 12.2  12.0 - 15.0 g/dL Final  . HCT 05/30/2019 38.3  36.0 - 46.0 % Final  . MCV 05/30/2019 86.7  80.0 - 100.0 fL Final  .  MCH 05/30/2019 27.6  26.0 - 34.0 pg Final  . MCHC 05/30/2019 31.9  30.0 - 36.0 g/dL Final  . RDW 05/30/2019 14.0  11.5 - 15.5 % Final  . Platelet Count 05/30/2019 205  150 - 400 K/uL Final  . nRBC 05/30/2019 0.0  0.0 - 0.2 % Final  . Neutrophils Relative % 05/30/2019 63  % Final  . Neutro Abs 05/30/2019 3.5  1.7 - 7.7 K/uL Final  . Lymphocytes Relative 05/30/2019 22  % Final  . Lymphs Abs 05/30/2019 1.3  0.7 - 4.0 K/uL Final  . Monocytes Relative 05/30/2019 12  % Final  . Monocytes Absolute 05/30/2019 0.7  0.1 - 1.0 K/uL Final  . Eosinophils Relative 05/30/2019 3  % Final  . Eosinophils Absolute 05/30/2019 0.2  0.0 - 0.5 K/uL Final  . Basophils Relative 05/30/2019 0  % Final  . Basophils Absolute 05/30/2019 0.0  0.0 - 0.1 K/uL Final  . Immature Granulocytes 05/30/2019 0  % Final  . Abs Immature Granulocytes 05/30/2019 0.02  0.00 - 0.07 K/uL Final   Performed at [facility], [place] [address], [place], [place] [address]     STUDIES: No results found.   ELIGIBLE FOR AVAILABLE RESEARCH PROTOCOL: [name], ASA study   ASSESSMENT: 48 y.o. [place] woman status post left breast upper inner quadrant biopsy 01/14/2017 for a clinical T3 N2, stage IIA invasive ductal carcinoma, grade 1 or 2, estrogen and progesterone receptor positive, HER-2 nonamplified, with an MIB-1 of 20%.  (1) staging studies: Brain MRI, bone scan, and CT scan of the chest 02/05/2017 showed no brain lesions, no lung or liver lesions, a 4.9 cm mass in the left breast with left axillary and subpectoral adenopathy, and nonspecific bone scan tracer at L2, left scapula, and anterior ribs, with lumbar spine MRI suggested for further evaluation.  (a) lumbar spine MRI 02/17/2017 showed no normal bone lesions.  There was mild lumbar spondylosis  (2) neoadjuvant chemotherapy consisting of cyclophosphamide and doxorubicin in dose dense fashion 4 started 02/10/2017, completed 03/24/2017, followed by weekly  carboplatin and gemcitabine given days 1 and 8 of each 21-day cycle starting 04/14/2017, completing the planned 4 cycles 06/26/2017  (3) is post left modified radical mastectomy on 08/27/2017 showing an mpT3 pN2 residual invasive ductal carcinoma, grade 2, with a residual cancer burden of 3.  Margins were clear  (a) latissimus flap reconstruction with expander 11/03/2018  (4) postmastectomy radiation completed 01/01/2018  (a) capecitabine radiosensitization 11/16/2017-01/01/2018  (5) goserelin started 09/21/2017  (a) letrozole started 01/11/2018  (b) enrolled in [facility] clinical trial, randomized to ribociclib on 05/31/2018  (c) Bone density on 03/16/2018 was normal with T score of -0.7 in the L1-L4 spine   (6) genetics testing 03/24/2017 through the Common Hereditary Cancer Panel offered by [facility] found no deleterious mutations in APC, ATM, AXIN2, BARD1, BMPR1A, BRCA1, BRCA2, BRIP1, CDH1, CDKN2A (p14ARF), CDKN2A (p16INK4a), CHEK2, CTNNA1, DICER1, EPCAM (Deletion/duplication testing only), GREM1 (promoter region deletion/duplication testing only), KIT, MEN1, MLH1, MSH2, MSH3, MSH6, MUTYH, NBN, NF1, NHTL1, PALB2, PDGFRA, PMS2, POLD1, POLE, PTEN, RAD50, RAD51C, RAD51D, SDHB, SDHC, SDHD, SMAD4,  SMARCA4. STK11, TP53, TSC1, TSC2, and VHL.  The following genes were evaluated for sequence changes only: SDHA and HOXB13 c.251G>A variant only.    (7) Right breast reduction and left breast expander placement on 03/03/2019   PLAN: Tanaysha is now 9 months out from definitive surgery for her breast cancer with no evidence of disease recurrence.  This is favorable.  She is proceeding with her reconstruction.  She will need more work on the left side which is considerably smaller than the left and she will see her plastic surgeon tomorrow for further discussion  She is tolerating the letrozole and goserelin well as well as the ribociclib.  We are making no changes in dose.  Per study she will return in 2  months for a visit and of course she continues to receive the goserelin here every 28 days, with labs  She knows to call for any other issue that may develop before the next visit.   [name]. Sua Spadafora, MD 05/30/19 11:30 AM Medical Oncology and Hematology [facility] [place], [place] [address] Tel. [phone]    Fax. [phone]   I, Wilburn Mylar, am acting as scribe for Dr. [name]. Aritha Huckeba.  I, Lurline Del MD, have reviewed the above documentation for accuracy and completeness, and I agree with the above.

## 2019-05-30 ENCOUNTER — Other Ambulatory Visit: Payer: Self-pay

## 2019-05-30 ENCOUNTER — Encounter: Payer: Self-pay | Admitting: *Deleted

## 2019-05-30 ENCOUNTER — Inpatient Hospital Stay: Payer: 59 | Attending: Adult Health

## 2019-05-30 ENCOUNTER — Inpatient Hospital Stay (HOSPITAL_BASED_OUTPATIENT_CLINIC_OR_DEPARTMENT_OTHER): Payer: 59 | Admitting: Oncology

## 2019-05-30 ENCOUNTER — Encounter: Payer: 59 | Admitting: Nutrition

## 2019-05-30 ENCOUNTER — Inpatient Hospital Stay: Payer: 59

## 2019-05-30 VITALS — BP 142/78 | HR 74 | Temp 98.7°F | Resp 20 | Ht 66.0 in | Wt 267.8 lb

## 2019-05-30 DIAGNOSIS — C50812 Malignant neoplasm of overlapping sites of left female breast: Secondary | ICD-10-CM

## 2019-05-30 DIAGNOSIS — Z170 Estrogen receptor positive status [ER+]: Secondary | ICD-10-CM | POA: Insufficient documentation

## 2019-05-30 DIAGNOSIS — Z9221 Personal history of antineoplastic chemotherapy: Secondary | ICD-10-CM

## 2019-05-30 DIAGNOSIS — Z79811 Long term (current) use of aromatase inhibitors: Secondary | ICD-10-CM | POA: Diagnosis not present

## 2019-05-30 DIAGNOSIS — C50212 Malignant neoplasm of upper-inner quadrant of left female breast: Secondary | ICD-10-CM

## 2019-05-30 DIAGNOSIS — Z923 Personal history of irradiation: Secondary | ICD-10-CM | POA: Insufficient documentation

## 2019-05-30 DIAGNOSIS — Z006 Encounter for examination for normal comparison and control in clinical research program: Secondary | ICD-10-CM | POA: Diagnosis not present

## 2019-05-30 DIAGNOSIS — Z9012 Acquired absence of left breast and nipple: Secondary | ICD-10-CM | POA: Insufficient documentation

## 2019-05-30 DIAGNOSIS — C50912 Malignant neoplasm of unspecified site of left female breast: Secondary | ICD-10-CM

## 2019-05-30 DIAGNOSIS — Z5111 Encounter for antineoplastic chemotherapy: Secondary | ICD-10-CM | POA: Insufficient documentation

## 2019-05-30 LAB — CBC WITH DIFFERENTIAL (CANCER CENTER ONLY)
Abs Immature Granulocytes: 0.02 10*3/uL (ref 0.00–0.07)
Basophils Absolute: 0 10*3/uL (ref 0.0–0.1)
Basophils Relative: 0 %
Eosinophils Absolute: 0.2 10*3/uL (ref 0.0–0.5)
Eosinophils Relative: 3 %
HCT: 38.3 % (ref 36.0–46.0)
Hemoglobin: 12.2 g/dL (ref 12.0–15.0)
Immature Granulocytes: 0 %
Lymphocytes Relative: 22 %
Lymphs Abs: 1.3 10*3/uL (ref 0.7–4.0)
MCH: 27.6 pg (ref 26.0–34.0)
MCHC: 31.9 g/dL (ref 30.0–36.0)
MCV: 86.7 fL (ref 80.0–100.0)
Monocytes Absolute: 0.7 10*3/uL (ref 0.1–1.0)
Monocytes Relative: 12 %
Neutro Abs: 3.5 10*3/uL (ref 1.7–7.7)
Neutrophils Relative %: 63 %
Platelet Count: 205 10*3/uL (ref 150–400)
RBC: 4.42 MIL/uL (ref 3.87–5.11)
RDW: 14 % (ref 11.5–15.5)
WBC Count: 5.7 10*3/uL (ref 4.0–10.5)
nRBC: 0 % (ref 0.0–0.2)

## 2019-05-30 LAB — CMP (CANCER CENTER ONLY)
ALT: 31 U/L (ref 0–44)
AST: 23 U/L (ref 15–41)
Albumin: 3.5 g/dL (ref 3.5–5.0)
Alkaline Phosphatase: 106 U/L (ref 38–126)
Anion gap: 9 (ref 5–15)
BUN: 16 mg/dL (ref 6–20)
CO2: 25 mmol/L (ref 22–32)
Calcium: 9 mg/dL (ref 8.9–10.3)
Chloride: 103 mmol/L (ref 98–111)
Creatinine: 0.79 mg/dL (ref 0.44–1.00)
GFR, Est AFR Am: >60 mL/min (ref >60)
GFR, Estimated: >60 mL/min (ref >60)
Glucose, Bld: 101 mg/dL — ABNORMAL HIGH (ref 70–99)
Potassium: 3.8 mmol/L (ref 3.5–5.1)
Sodium: 137 mmol/L (ref 135–145)
Total Bilirubin: 0.4 mg/dL (ref 0.3–1.2)
Total Protein: 7.3 g/dL (ref 6.5–8.1)

## 2019-05-30 MED ORDER — GOSERELIN ACETATE 3.6 MG ~~LOC~~ IMPL
3.6000 mg | DRUG_IMPLANT | Freq: Once | SUBCUTANEOUS | Status: AC
  Administered 2019-05-30: 3.6 mg via SUBCUTANEOUS

## 2019-05-30 MED ORDER — GOSERELIN ACETATE 3.6 MG ~~LOC~~ IMPL
DRUG_IMPLANT | SUBCUTANEOUS | Status: AC
  Filled 2019-05-30: qty 3.6

## 2019-05-30 NOTE — Research (Signed)
[place] study; Patient into clinic by herself this morning for evaluation by Dr. Jana Hakim to determine if she can resume Ribociclib post operatively.  Patient had removal of expander and placement of implant on left breast on 05/25/19.  Cycle 13 of Ribociclib was held by Dr. Jana Hakim for surgery.  Reconsent Protocol Version 5.1 Met patient alone prior to lab appointment, changes to the [id number] Informed Consent Form (PICF) version 5.1 dated 14-Apr-2019 and IRB approved 28-Nov-2020were reviewed with the patient.  All of the patient's questions were answered and she had no further concerns. She agrees to continued participation in the main study and to continued participation in the optional portions of the study as well. The Revised Patient Informed Consent Form (PICF) was signed and dated by the patient. She understands that she is free to withdraw her consent for the main study and optional portions at any time. Copies of signed consent forms for versions 4.1 (patient signed on previous visit) and 5.1 (signed today) were given to patient for her records.  Labs;  Results reviewed by Dr. Jana Hakim. Results are all within study parameters to continue Ribociclib. Vitals; Vital signs completed after patient rested more than five minutes. Weight without shoes. See VS flowsheet.  Concomitant Medications; Reviewed medication list with patient. Patient states she did not take any nausea medication at home after her surgery.  She is taking antibiotic as prescribed since day of surgery. She resumed taking diazepam PRN for breast pain after surgery. She is taking hydrocodone for post operative breast pain.  Physical Exam/PS - Please see documentation in today's office visit encounter with Dr. Jana Hakim.  Dr. Jana Hakim instructed ok for patient to resume Ribociclib cycle 14 today as planned with no dose modifications.  Adverse Events;  Reviewed with patient. Patient reports moderate left breast  pain post-operatively, grade 2, since 05/25/19. Patient was taking hydrocodone about 3 times a day. Pain has improved to mild since yesterday and patient is taking hydrocodone once a day at bedtime. See AE table below.  Investigational Medication - Ribociclib; Patient returned unopened bottle of Ribociclib. IRT assigned #[id number] from cycle 13.  This bottle returned to Raul Del, pharmacist for recording. Patient has 2 unopened bottles for cycles #14 and #15.  Patient took 2 pills of Ribociclib in clinic at 11:35 am, day 1 of cycle 14. She also took her Letrozole at the same time.  Goserelin; Administered per protocol.  Plan/Scheduling - Dr. Jana Hakim instructed for patient to have labs every 28 days with injection appointment. Her next appointment lab/injection is cycle 15 on 06/27/19.  Next study visit is due for cycle 16 on 07/25/19.  Patient will come in the Friday before on 07/22/19 for labs so results will be available on Monday morning's appointment.  Patient agreed with this plan.   Patient is aware that she should contact the clinic at any time ifanysymptomsworsen orrecur, or if she has any questions or concerns prior to next visit.  Thanked patient for her ongoing participation in this clnical trial.    AE Table: Event Grade Onset Date End Date Status Comments Attribution to Ribociclib Attribution to Letrozole Attribution to Goserelin  Allergic Rhinitis 2 2012  ongoing At Baseline n/a n/a n/a  Constipation 1 08/27/17  ongoing At Baseline n/a n/a n/a  Cough, intermittent 2 05/09/18  ongoing At Baseline n/a n/a n/a  GERD, intermittent 1 2018  ongoing At Baseline n/a n/a n/a  Gluten Sensitivity 1 12/2017  ongoing At Baseline n/a n/a n/a  Headaches 1 02/2017  ongoing At Baseline n/a n/a n/a  Hot Flashes 2 03/2017  ongoing At Baseline n/a n/a n/a  Hyperglycemia 1 2018  ongoing At Baseline n/a n/a n/a  Hyperlipidemia 1 03/2016  ongoing At Baseline n/a n/a n/a  Vitamin D Deficiency 2  05/2015  ongoing At Baseline n/a n/a n/a  Arthralgias bilat legs 1 10/01/18  ongoing At Baseline     Fatigue 1 06/28/18  ongoing   Yes No No  Blurred Vision 1 07/14/18  ongoing  No No No  Hypertension  1-3 2012  ongoing At Baseline. Intermittent grade 1 - 3 recorded since 2012.       Decreased WBC 2 04/29/19 05/30/19 resolved  Yes No No  Breast Pain (tightness)  2 01/25/19 05/25/19 resolved Expanders removed No No No  Left Shoulder stiffness, pain 2 03/25/19  ongoing Physical Therapy No No No  Numbness bilat arms, intermittent 1 12/08/18  ongoing  No No No  Increased Alk Phos 1 03/15/19 05/30/19 resolved  No No No  Increased ALT 1 03/29/19 05/30/19 resolved  No No No  Cystic area on Liver shown on [name] (Fatty Liver) 1 04/04/19  ongoing  Fatty Liver per Dr. Jana Hakim. MRI ordered to r/o neoplasm after breast expanders are removed. No No No  Surgical Pain 2 05/25/19 05/29/19 decreased grade Hydrocodone/diazepam prn     Surgical Pain 1 05/29/19  ongoing        Foye Spurling, BSN, RN Clinical Research Nurse 05/30/2019 12:25 PM

## 2019-05-30 NOTE — Patient Instructions (Signed)

## 2019-05-31 ENCOUNTER — Ambulatory Visit (INDEPENDENT_AMBULATORY_CARE_PROVIDER_SITE_OTHER): Payer: 59 | Admitting: Surgical

## 2019-05-31 ENCOUNTER — Encounter: Payer: Self-pay | Admitting: Plastic Surgery

## 2019-05-31 VITALS — BP 144/82 | HR 87 | Temp 97.5°F | Wt 271.1 lb

## 2019-05-31 DIAGNOSIS — C50812 Malignant neoplasm of overlapping sites of left female breast: Secondary | ICD-10-CM

## 2019-05-31 DIAGNOSIS — Z9012 Acquired absence of left breast and nipple: Secondary | ICD-10-CM

## 2019-05-31 DIAGNOSIS — Z170 Estrogen receptor positive status [ER+]: Secondary | ICD-10-CM

## 2019-05-31 DIAGNOSIS — C50212 Malignant neoplasm of upper-inner quadrant of left female breast: Secondary | ICD-10-CM

## 2019-05-31 NOTE — Progress Notes (Signed)
Mackenzie [name] is a 48 year old female here for follow-up after expander to implant exchange on 05/25/2019 with Dr. Marla Roe.  She had a 750 cc Mentor smooth round ultra high profile gel implant placed at that time.  She is doing really well.  She has Steri-Strips and Dermabond in place.  The periincisional area is nonerythematous, nontender, no dehiscence noted.  She has not had any fevers, chills, nausea, vomiting.  She has had some heat flashes, but feels as if this is unrelated.  She has had to take some Norco intermittently but overall is doing well with pain control.  She has some concerns about the breast being asymmetrical.   BP (!) 144/82 (BP Location: Left Arm, Patient Position: Sitting, Cuff Size: Normal)   Pulse 87   Temp (!) 97.5 F (36.4 C)   Wt 271 lb 1 oz (123 kg)   LMP 02/10/2017   BMI 43.75 kg/m  Chaperone present  Left breast incision C/D/I, latissimus flap with good capillary refill, Steri-Strips in place, Dermabond in place, radiation damaged skin noted.  No wounds at this time.  Right breast well-healed after reduction/mastopexy to match left breast.   A/P:  Mackenzie [name] is doing well.  Incision is healing well.  No fever, chills, nausea, vomiting.  No sign of infection, seroma, hematoma.  Continue to wear breast binder or sports bra for the next few weeks 24/7.  Follow-up early January for reevaluation and possible note for return to work.  At this time I would like her to stay out of work until at least January 7.  She is a Marine scientist.   Allow time for incision to heal and implant to settle, may need Lipo filling, release of scarring in the future.  Call with any questions or concerns.

## 2019-05-31 NOTE — Progress Notes (Signed)
   Subjective:    Patient ID: Mackenzie [name], female    DOB: 1970/08/05, 48 y.o.   MRN: [id number]  The patient is a 48 year old female here for follow-up after undergoing breast surgery.  She had a mastectomy with expander placement.  She had exchange of the expander for an implant last week.  She has a 0000000 cc silicone implant in place.  Incision is healing well.  No signs of infection.     Review of Systems  Constitutional: Negative.   Eyes: Negative.   Respiratory: Negative.   Cardiovascular: Negative.   Gastrointestinal: Negative.   Genitourinary: Negative.   Musculoskeletal: Negative.        Objective:   Physical Exam Vitals and nursing note reviewed.  Constitutional:      Appearance: Normal appearance.  Cardiovascular:     Pulses: Normal pulses.  Pulmonary:     Effort: Pulmonary effort is normal.  Neurological:     General: No focal deficit present.     Mental Status: She is alert and oriented to person, place, and time.  Psychiatric:        Mood and Affect: Mood normal.        Behavior: Behavior normal.        Assessment & Plan:     ICD-10-CM   1. Malignant neoplasm of upper-inner quadrant of left breast in female, estrogen receptor positive ([place])  C50.212    Z17.0   2. Malignant 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, estrogen receptor positive ([place])  C50.812    Z17.0   3. Acquired absence of left breast  Z90.12

## 2019-06-02 ENCOUNTER — Other Ambulatory Visit: Payer: Self-pay | Admitting: *Deleted

## 2019-06-02 DIAGNOSIS — C50912 Malignant neoplasm of unspecified site of left female breast: Secondary | ICD-10-CM | POA: Diagnosis not present

## 2019-06-02 DIAGNOSIS — C50812 Malignant neoplasm of overlapping sites of left female breast: Secondary | ICD-10-CM

## 2019-06-08 ENCOUNTER — Other Ambulatory Visit: Payer: Self-pay

## 2019-06-08 ENCOUNTER — Encounter: Payer: 59 | Attending: Internal Medicine | Admitting: Registered"

## 2019-06-08 ENCOUNTER — Encounter: Payer: Self-pay | Admitting: Registered"

## 2019-06-08 DIAGNOSIS — E669 Obesity, unspecified: Secondary | ICD-10-CM | POA: Diagnosis not present

## 2019-06-08 DIAGNOSIS — C50912 Malignant neoplasm of unspecified site of left female breast: Secondary | ICD-10-CM | POA: Diagnosis not present

## 2019-06-08 NOTE — Progress Notes (Signed)
  Medical Nutrition Therapy:  Appt start time: 11:35 end time:  12:17.   Assessment:  Primary concerns today: Pt arrives with 50 year old daughter. States she had surgery in Aug. Lab results revealed elevated liver function. Also reports she has fatty liver and recommended to lose weight. Reports recent fasting glucose 100-129, A1c 5.9. States she was recommended to lose weight and pt states she wants to be less than 200 lbs.  Pt states she tries to limit fast food. Cooks more often during the week. Works 12 hours/week but may pick up more hours. States she sleeps 7-8 hours/night. States she prefers aerobics, Zumba, and some strength training for physical activity. States she walks sometimes but it is not consistent.   Preferred Learning Style:   No preference indicated   Learning Readiness:   Ready  Change in progress   MEDICATIONS: See list   DIETARY INTAKE:  Usual eating pattern includes 3 meals and 1 snack per day.  Everyday foods include salad, pizza, casseroles, ham, mac and cheese.  Avoided foods include none stated.    24-hr recall:  B (9 AM): sausage, egg, cheese casserole  Snk ( AM):   L (1:30 PM): homemade pizza (pita bread, sausage, ham, green peppers, onions, peppers, carrots, cheese) + salad  Snk ( PM):  D (6-7 PM): ham + green bean casserole + sweet potato casserole + mac and cheese  Snk ( PM): bbq chips Beverages: water (~48 oz), sweet tea with lemon  Usual physical activity: walk 1.5 mi, 3x/week  Estimated energy needs: 1800 calories 200 g carbohydrates 135 g protein 50 g fat  Progress Towards Goal(s):  In progress.   Nutritional Diagnosis:  NB-1.1 Food and nutrition-related knowledge deficit As related to lack of prior nutrition-related education.  As evidenced by verbalizes incomplete information.    Intervention:  Nutrition education and counseling. Pt was educated and counseled on components of balanced meals/snacks, ways to increase vegetable  intake, and increase water intake. Pt was in agreement with goals listed.  Goals: - Aim to increase vegetable intake to at least half plate non-starchy vegetables with lunch and dinner.  - Aim to have meals/snacks every 3-5 hours.  - Add in another bottle of water.  Teaching Method Utilized:  Visual Auditory Hands on  Handouts given during visit include:  Meal Ideas  Barriers to learning/adherence to lifestyle change: none identified  Demonstrated degree of understanding via:  Teach Back   Monitoring/Evaluation:  Dietary intake, exercise, and body weight prn.

## 2019-06-08 NOTE — Patient Instructions (Addendum)
-   Aim to increase vegetable intake to at least half plate non-starchy vegetables with lunch and dinner.   - Aim to have meals/snacks every 3-5 hours.   - Add in another bottle of water.

## 2019-06-15 ENCOUNTER — Encounter: Payer: Self-pay | Admitting: Plastic Surgery

## 2019-06-15 ENCOUNTER — Ambulatory Visit (INDEPENDENT_AMBULATORY_CARE_PROVIDER_SITE_OTHER): Payer: 59 | Admitting: Surgical

## 2019-06-15 ENCOUNTER — Other Ambulatory Visit: Payer: Self-pay

## 2019-06-15 ENCOUNTER — Encounter: Payer: Self-pay | Admitting: Surgical

## 2019-06-15 VITALS — BP 134/85 | HR 77 | Temp 97.5°F | Ht 66.0 in | Wt 271.0 lb

## 2019-06-15 DIAGNOSIS — Z9889 Other specified postprocedural states: Secondary | ICD-10-CM

## 2019-06-15 DIAGNOSIS — Z170 Estrogen receptor positive status [ER+]: Secondary | ICD-10-CM

## 2019-06-15 DIAGNOSIS — C50212 Malignant neoplasm of upper-inner quadrant of left female breast: Secondary | ICD-10-CM

## 2019-06-15 NOTE — Progress Notes (Signed)
Subjective:     Patient ID: Mackenzie [name], female    DOB: 19-Sep-1970, 49 y.o.   MRN: [id number]  Chief Complaint  Patient presents with  . Follow-up    2 week follow up from [facility]: 05/25/19 removal of left tissue expander and placement of implant    HPI: The patient is a 49 y.o. female here for follow-up on her breast reconstruction.  She is doing well.  She reports that her range of motion has been better since removal of expander and placement of the implant.  She has an appointment with PT, but this needs an additional order sent.  Her incisions are healing well, C/D/I, no drainage noted, no wounds noted.  No periincisional erythema or tenderness.  She does have some scarring at the and excess adipose tissue causing asymmetry at the medial aspect of the left breast.  She has not had any fevers, chills, nausea, vomiting, chest pain, shortness of breath.  She is planning to return to work soon.  She works as a Marine scientist.  Review of Systems  Constitutional: Positive for activity change. Negative for chills, fatigue and fever.  Respiratory: Negative.   Cardiovascular: Negative.   Gastrointestinal: Negative.   Musculoskeletal: Negative.   Skin: Positive for color change. Negative for wound.  Neurological: Negative.   Psychiatric/Behavioral: Negative.      Objective:   Vital Signs BP 134/85 (BP Location: Right Arm, Patient Position: Sitting, Cuff Size: Large)   Pulse 77   Temp (!) 97.5 F (36.4 C) (Temporal)   Ht 5\' 6"  (1.676 m)   Wt 271 lb (122.9 kg)   LMP 02/10/2017   SpO2 99%   BMI 43.74 kg/m  Vital Signs and Nursing Note Reviewed Chaperone present Physical Exam  Constitutional: She is oriented to person, place, and time and well-developed, well-nourished, and in no distress.  HENT:  Head: Normocephalic and atraumatic.  Cardiovascular: Normal rate.  Pulmonary/Chest: Effort normal.        Musculoskeletal:        General: Edema (L arm) present. Normal range of  motion.  Neurological: She is alert and oriented to person, place, and time. Gait normal.  Skin: Skin is warm and dry. She is not diaphoretic. No erythema.  Radiation skin changes L breast   Psychiatric: Mood and affect normal.    Assessment/Plan:     ICD-10-CM   1. S/P breast reconstruction  Z98.890 PT eval and treat  2. Malignant neoplasm of upper-inner quadrant of left breast in female, estrogen receptor positive ([place])  C50.212 PT eval and treat   Z17.0    [name] is doing really well.  Her incisions are healing well.  There is no sign of infection, seroma, hematoma.  Patient has PT appointment scheduled for assistance with range of motion and strengthening of left arm. This will be beneficial for her to increase her range of motion and strength.  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  She can come off of light duty beginning February 1 depending on her PT eval and treatment plan.  She was provided a work note today for return to work information.  She does have some asymmetry of the left breast due to scarring and excess adipose tissue along the medial aspect, may need revision surgery in the future.  Allow 6 months for implant to settle.  She is very pleased with her progress so far.  Follow-up in 3 months.  Call with  any question concerns prior to that visit.  Carola Rhine Mavrick Mcquigg, PA-C 06/15/2019, 9:33 AM

## 2019-06-21 ENCOUNTER — Encounter: Payer: Self-pay | Admitting: Physical Therapy

## 2019-06-21 ENCOUNTER — Ambulatory Visit: Payer: 59 | Attending: Surgical | Admitting: Physical Therapy

## 2019-06-21 ENCOUNTER — Telehealth: Payer: Self-pay | Admitting: *Deleted

## 2019-06-21 ENCOUNTER — Other Ambulatory Visit: Payer: Self-pay

## 2019-06-21 DIAGNOSIS — M25612 Stiffness of left shoulder, not elsewhere classified: Secondary | ICD-10-CM | POA: Insufficient documentation

## 2019-06-21 DIAGNOSIS — Z9189 Other specified personal risk factors, not elsewhere classified: Secondary | ICD-10-CM | POA: Diagnosis not present

## 2019-06-21 DIAGNOSIS — M6281 Muscle weakness (generalized): Secondary | ICD-10-CM | POA: Diagnosis not present

## 2019-06-21 DIAGNOSIS — Z483 Aftercare following surgery for neoplasm: Secondary | ICD-10-CM | POA: Diagnosis not present

## 2019-06-21 DIAGNOSIS — M25512 Pain in left shoulder: Secondary | ICD-10-CM | POA: Diagnosis not present

## 2019-06-21 DIAGNOSIS — R278 Other lack of coordination: Secondary | ICD-10-CM | POA: Diagnosis not present

## 2019-06-21 DIAGNOSIS — R252 Cramp and spasm: Secondary | ICD-10-CM | POA: Insufficient documentation

## 2019-06-21 NOTE — Therapy (Signed)
[place], [place], [address] Phone: [phone]   Fax:  [phone]  Physical Therapy Treatment  Patient Details  Name: Mackenzie [name] MRN: [id number] Date of Birth: 03/07/1971 Referring Provider (PT): [place]   Encounter Date: 06/21/2019  PT End of Session - 06/21/19 1603    Visit Number  16   pt arrived late   Number of Visits  24    Date for PT Re-Evaluation  07/19/19    PT Start Time  1512    PT Stop Time  1555    PT Time Calculation (min)  43 min    Activity Tolerance  Patient tolerated treatment well    Behavior During Therapy  [facility] for tasks assessed/performed       Past Medical History:  Diagnosis Date  . Abnormal glucose 2018  . 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 Allergic rhinitis 2012  . Anemia 01/27/2017   prior to starting chemotherapy  . Breast cancer ([place]) 01/14/2017   Left breast  . 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 Fatty liver   . Hot flashes 03/2017  . Hyperlipidemia 03/20/2016  . Morbid obesity with body mass index (BMI) of 40.0 to 49.9 ([place]) 09/17/2017  . NCGS (non-celiac gluten sensitivity)   . Pre-diabetes   . Seasonal allergies 2012   seasonal allergies causes allergic rhinitis and itchy, dry eyes per pt  . Vitamin D deficiency 05/2015    Past Surgical History:  Procedure Laterality Date  . BREAST RECONSTRUCTION WITH PLACEMENT OF TISSUE EXPANDER AND FLEX HD (ACELLULAR HYDRATED DERMIS) Left 08/27/2017   Procedure: LEFT BREAST RECONSTRUCTION WITH PLACEMENT OF TISSUE EXPANDER AND FLEX HD;  Surgeon: Wallace Going, DO;  Location: [facility];  Service: Plastics;  Laterality: Left;  . CESAREAN SECTION     x2  . LATISSIMUS FLAP TO BREAST Left 11/03/2018  . LATISSIMUS FLAP TO BREAST Left 11/03/2018   Procedure: LATISSIMUS FLAP TO LEFT BREAST;  Surgeon: Wallace Going, DO;  Location: [facility];  Service:  Plastics;  Laterality: Left;  Marland Kitchen MASTECTOMY MODIFIED RADICAL Left 08/27/2017  . MASTECTOMY MODIFIED RADICAL Left 08/27/2017   Procedure: LEFT MODIFIED RADICAL MASTECTOMY;  Surgeon: Erroll Luna, MD;  Location: [facility];  Service: General;  Laterality: Left;  Marland Kitchen MASTOPEXY Right 03/03/2019   Procedure: RIGHT BREAST MASTOPEXY/REDUCTION;  Surgeon: Wallace Going, DO;  Location: [facility];  Service: Plastics;  Laterality: Right;  3 hours, please  . PORT-A-CATH REMOVAL Right 08/27/2017   Procedure: REMOVAL PORT-A-CATH RIGHT CHEST;  Surgeon: Erroll Luna, MD;  Location: [facility];  Service: General;  Laterality: Right;  . PORTACATH PLACEMENT Right 01/28/2017   Procedure: INSERTION PORT-A-CATH WITH ULTRASOUND;  Surgeon: Erroll Luna, MD;  Location: [facility];  Service: General;  Laterality: Right;  . REMOVAL OF TISSUE EXPANDER AND PLACEMENT OF IMPLANT Left 03/03/2019   Procedure: REMOVAL OF TISSUE EXPANDER AND PLACEMENT OF EXPANDER;  Surgeon: Wallace Going, DO;  Location: [facility];  Service: Plastics;  Laterality: Left;  . REMOVAL OF TISSUE EXPANDER AND PLACEMENT OF IMPLANT Left 05/25/2019   Procedure: LEFT BREAST REMOVAL OF TISSUE EXPANDER AND PLACEMENT OF IMPLANT;  Surgeon: Wallace Going, DO;  Location: [facility];  Service: Plastics;  Laterality: Left;  . TISSUE EXPANDER PLACEMENT Left 11/03/2018   Procedure: PLACEMENT OF TISSUE EXPANDER LEFT BREAST;  Surgeon: Wallace Going, DO;  Location[facility];  Service: Clinical cytogeneticist;  Laterality: Left;  Total case time is 3.5 hours  . TUBAL LIGATION Bilateral 01/21/2011    There were no vitals filed for this visit.  Subjective Assessment - 06/21/19 1513    Subjective  The surgery went well. I am going to have to get some fill ins down the road. I am still having a hard time doing things over my head.    Pertinent History  Left breast cancer diagnosed 01/10/17 by mammogram and needle  biopsy. Then had neo-adjuvant chemo starting 02/10/17 until 06/26/17; had left mastectomy 08/27/17 with ALND and immediate expander placed. Radiation completed. Expander removed and replaced on May 27, 19 b/c it did not get expanded enough prior to radiation, also had lat flap 11/02/17. Had expander removed and replaced again on 03/03/19 due to a small leak, pt had reduction on R breast at this time    Patient Stated Goals  to get strength back and ROM, be able to unhook bra easily    Currently in Pain?  No/denies    Pain Score  0-No pain         [facility] PT Assessment - 06/21/19 0001      AROM   Left Shoulder Flexion  143 Degrees    Left Shoulder ABduction  135 Degrees                   [facility] Adult PT Treatment/Exercise - 06/21/19 0001      Shoulder Exercises: Pulleys   Flexion  2 minutes    ABduction  2 minutes      Manual Therapy   Soft tissue mobilization  in supine to Lt pec muscle     Passive ROM  to Lt shoulder in direction of abduction, flexion, and ER with prolonged holds while performing STM to L pec muscle to decrease tightness                  PT Long Term Goals - 06/21/19 1514      PT LONG TERM GOAL #1   Title  Pt will demonstrate 160 degrees of left shoulder flexion to allow her to reach overhead.    Baseline  137, 04/26/19- 141, 05/19/19- 150, 06/21/19- 143    Time  4    Period  Weeks    Status  On-going      PT LONG TERM GOAL #2   Title  Pt will demonstrate 160 degrees of left shoulder abduction to allow her to reach out to the side    Baseline  135, 04/26/19- 160, 06/21/19- 135    Time  4    Period  Weeks    Status  On-going      PT LONG TERM GOAL #3   Title  Pt will report an 80% improvement in pain and tightness in L axilla to allow pt to move LUE without discomfort.    Baseline  04/26/19- 60% improved, 05/19/19- 75% improvement, 06/21/19- 65% improvement    Time  4    Period  Weeks    Status  On-going      PT LONG TERM GOAL #4   Title   Pt will be independent with a home exercise program for continued strengthening and stretching    Time  4    Period  Weeks    Status  On-going      PT LONG TERM GOAL #5   Title  Pt will report a 50% improvement in back pain  in area of lat flap when walking to allow improved comfort    Baseline  04/26/19- 0% improvement, 05/19/19- 80% improved    Time  4    Period  Weeks    Status  Achieved            Plan - 06/21/19 1605    Clinical Impression Statement  Pt presents to PT after undergoing implant placement and expander removal. Reassessed pt's progress towards goals in therapy. She has lost ROM in direction of flexion and abduction and is having increased tightness in left axilla. She reports difficulty doing things overhead like fixing her hair. Continued with AAROM exercises today and PROM to increase left shoulder ROM. Educated pt to return to exercises at home and issued yellow band for supine scapular exercises since she only had red band at home. Pt would benefit from skilled PT services to continue to progress towards goals in therapy and improve left shoulder ROM and strength and decrease tightness.    PT Frequency  2x / week    PT Duration  4 weeks    PT Treatment/Interventions  ADLs/Self Care Home Management;Therapeutic exercise;Patient/family education;Manual techniques;Manual lymph drainage;Compression bandaging;Scar mobilization;Passive range of motion;Therapeutic activities    PT Next Visit Plan  PROM to L shoulder, STM to L pec, continue scapular strengthening, once ROM improves give Strength ABC program    PT Home Exercise Plan  Access Code: [email or web address], supine over foam roll, rockwood for LUE, wall washing, 3 way raises, corner stretch, post op stretches for after surgery of Dr. Sanjuana Mae and Agree with Plan of Care  Patient       Patient will benefit from skilled therapeutic intervention in order to improve the following deficits and impairments:  Decreased range  of motion, Impaired UE functional use, Decreased strength, Other (comment), Pain, Postural dysfunction  Visit Diagnosis: Stiffness of left shoulder, not elsewhere classified  Aftercare following surgery for neoplasm  Acute pain of left shoulder  Muscle weakness (generalized)  Cramp and spasm  Other lack of coordination  At risk for lymphedema     Problem List Patient Active Problem List   Diagnosis Date Noted  . Benign essential HTN 08/17/2018  . Morbid obesity with body mass index (BMI) of 40.0 to 49.9 ([place]) 09/17/2017  . 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 Malignant neoplasm of upper-inner quadrant of left breast in female, estrogen receptor positive ([place]) 07/27/2017  . Genetic testing 03/25/2017  . Malignant 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, estrogen receptor positive ([place]) 01/23/2017  . 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

## 2019-06-21 NOTE — Telephone Encounter (Signed)
Mackenzie [name]; LVM for patient regarding her lab appointment on Monday 06/27/19.  Reminded patient this is not a [name] required lab.  Dr. Jana Hakim ordered this lab. Patient does not need to fast and can leave after her goserelin injection. Instructed patient to not start cycle 15 of Ribociclib on Monday as scheduled until her labs are reviewed just to make sure everything is ok. This research nurse is out of the office on Monday but another nurse will call her with results and instructions regarding Ribociclib that day.  Instructed patient to call research nurse back if any questions or concerns.  Foye Spurling, BSN, RN Clinical Research Nurse 06/21/2019 11:14 AM

## 2019-06-24 ENCOUNTER — Encounter: Payer: Self-pay | Admitting: Rehabilitation

## 2019-06-24 ENCOUNTER — Ambulatory Visit: Payer: 59 | Admitting: Rehabilitation

## 2019-06-24 ENCOUNTER — Other Ambulatory Visit: Payer: Self-pay

## 2019-06-24 DIAGNOSIS — Z483 Aftercare following surgery for neoplasm: Secondary | ICD-10-CM

## 2019-06-24 DIAGNOSIS — Z9189 Other specified personal risk factors, not elsewhere classified: Secondary | ICD-10-CM | POA: Diagnosis not present

## 2019-06-24 DIAGNOSIS — M25612 Stiffness of left shoulder, not elsewhere classified: Secondary | ICD-10-CM | POA: Diagnosis not present

## 2019-06-24 DIAGNOSIS — M6281 Muscle weakness (generalized): Secondary | ICD-10-CM | POA: Diagnosis not present

## 2019-06-24 DIAGNOSIS — R278 Other lack of coordination: Secondary | ICD-10-CM | POA: Diagnosis not present

## 2019-06-24 DIAGNOSIS — R252 Cramp and spasm: Secondary | ICD-10-CM | POA: Diagnosis not present

## 2019-06-24 DIAGNOSIS — M25512 Pain in left shoulder: Secondary | ICD-10-CM | POA: Diagnosis not present

## 2019-06-24 NOTE — Therapy (Signed)
[place], [place], [address] Phone: [phone]   Fax:  [phone]  Physical Therapy Treatment  Patient Details  Name: Mackenzie [name] MRN: [id number] Date of Birth: 1971/04/26 Referring Provider (PT): Dillingham   Encounter Date: 06/24/2019  PT End of Session - 06/24/19 0847    Visit Number  17    Number of Visits  24    PT Start Time  0800    PT Stop Time  0842    PT Time Calculation (min)  42 min    Activity Tolerance  Patient tolerated treatment well    Behavior During Therapy  [facility] for tasks assessed/performed       Past Medical History:  Diagnosis Date  . Abnormal glucose 2018  . 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 Allergic rhinitis 2012  . Anemia 01/27/2017   prior to starting chemotherapy  . Breast cancer ([place]) 01/14/2017   Left breast  . 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 Fatty liver   . Hot flashes 03/2017  . Hyperlipidemia 03/20/2016  . Morbid obesity with body mass index (BMI) of 40.0 to 49.9 ([place]) 09/17/2017  . NCGS (non-celiac gluten sensitivity)   . Pre-diabetes   . Seasonal allergies 2012   seasonal allergies causes allergic rhinitis and itchy, dry eyes per pt  . Vitamin D deficiency 05/2015    Past Surgical History:  Procedure Laterality Date  . BREAST RECONSTRUCTION WITH PLACEMENT OF TISSUE EXPANDER AND FLEX HD (ACELLULAR HYDRATED DERMIS) Left 08/27/2017   Procedure: LEFT BREAST RECONSTRUCTION WITH PLACEMENT OF TISSUE EXPANDER AND FLEX HD;  Surgeon: Wallace Going, DO;  Location: [place];  Service: Plastics;  Laterality: Left;  . CESAREAN SECTION     x2  . LATISSIMUS FLAP TO BREAST Left 11/03/2018  . LATISSIMUS FLAP TO BREAST Left 11/03/2018   Procedure: LATISSIMUS FLAP TO LEFT BREAST;  Surgeon: Wallace Going, DO;  Location: [facility];  Service: Plastics;  Laterality: Left;  Marland Kitchen MASTECTOMY MODIFIED RADICAL Left  08/27/2017  . MASTECTOMY MODIFIED RADICAL Left 08/27/2017   Procedure: LEFT MODIFIED RADICAL MASTECTOMY;  Surgeon: Erroll Luna, MD;  Location: [facility];  Service: General;  Laterality: Left;  Marland Kitchen MASTOPEXY Right 03/03/2019   Procedure: RIGHT BREAST MASTOPEXY/REDUCTION;  Surgeon: Wallace Going, DO;  Location: [facility];  Service: Plastics;  Laterality: Right;  3 hours, please  . PORT-A-CATH REMOVAL Right 08/27/2017   Procedure: REMOVAL PORT-A-CATH RIGHT CHEST;  Surgeon: Erroll Luna, MD;  Location: [facility];  Service: General;  Laterality: Right;  . PORTACATH PLACEMENT Right 01/28/2017   Procedure: INSERTION PORT-A-CATH WITH ULTRASOUND;  Surgeon: Erroll Luna, MD;  Location: [place];  Service: General;  Laterality: Right;  . REMOVAL OF TISSUE EXPANDER AND PLACEMENT OF IMPLANT Left 03/03/2019   Procedure: REMOVAL OF TISSUE EXPANDER AND PLACEMENT OF EXPANDER;  Surgeon: Wallace Going, DO;  Location: [place];  Service: Plastics;  Laterality: Left;  . REMOVAL OF TISSUE EXPANDER AND PLACEMENT OF IMPLANT Left 05/25/2019   Procedure: LEFT BREAST REMOVAL OF TISSUE EXPANDER AND PLACEMENT OF IMPLANT;  Surgeon: Wallace Going, DO;  Location: [facility] [place];  Service: Plastics;  Laterality: Left;  . TISSUE EXPANDER PLACEMENT Left 11/03/2018   Procedure: PLACEMENT OF TISSUE EXPANDER LEFT BREAST;  Surgeon: Wallace Going, DO;  Location: [facility];  Service: Plastics;  Laterality: Left;  Total case time is 3.5  hours  . TUBAL LIGATION Bilateral 01/21/2011    There were no vitals filed for this visit.  Subjective Assessment - 06/24/19 0806    Subjective  I am doing well    Pertinent History  Left breast cancer diagnosed 01/10/17 by mammogram and needle biopsy. Then had neo-adjuvant chemo starting 02/10/17 until 06/26/17; had left mastectomy 08/27/17 with ALND and immediate expander placed. Radiation completed. Expander removed and replaced  on May 27, 19 b/c it did not get expanded enough prior to radiation, also had lat flap 11/02/17. Had expander removed and replaced again on 03/03/19 due to a small leak, pt had reduction on R breast at this time    Patient Stated Goals  to get strength back and ROM, be able to unhook bra easily    Currently in Pain?  No/denies         [facility] PT Assessment - 06/24/19 0001      AROM   Left Shoulder Flexion  143 Degrees    Left Shoulder ABduction  140 Degrees                   [facility] Adult PT Treatment/Exercise - 06/24/19 0001      Shoulder Exercises: Supine   Protraction  Both;10 reps    Protraction Limitations  pro/ret x 10 with cueing to keep elbows straight    Flexion  Both;10 reps    Flexion Limitations  alternating      Shoulder Exercises: Pulleys   Flexion  2 minutes    ABduction  2 minutes      Shoulder Exercises: Therapy Ball   Flexion  Both;10 reps    ABduction  Left;10 reps      Manual Therapy   Soft tissue mobilization  in supine to Lt pec muscle     Passive ROM  to Lt shoulder in direction of abduction, flexion, and ER with prolonged holds while performing STM to L pec muscle to decrease tightness                  PT Long Term Goals - 06/21/19 1514      PT LONG TERM GOAL #1   Title  Pt will demonstrate 160 degrees of left shoulder flexion to allow her to reach overhead.    Baseline  137, 04/26/19- 141, 05/19/19- 150, 06/21/19- 143    Time  4    Period  Weeks    Status  On-going      PT LONG TERM GOAL #2   Title  Pt will demonstrate 160 degrees of left shoulder abduction to allow her to reach out to the side    Baseline  135, 04/26/19- 160, 06/21/19- 135    Time  4    Period  Weeks    Status  On-going      PT LONG TERM GOAL #3   Title  Pt will report an 80% improvement in pain and tightness in L axilla to allow pt to move LUE without discomfort.    Baseline  04/26/19- 60% improved, 05/19/19- 75% improvement, 06/21/19- 65% improvement     Time  4    Period  Weeks    Status  On-going      PT LONG TERM GOAL #4   Title  Pt will be independent with a home exercise program for continued strengthening and stretching    Time  4    Period  Weeks    Status  On-going  PT LONG TERM GOAL #5   Title  Pt will report a 50% improvement in back pain in area of lat flap when walking to allow improved comfort    Baseline  04/26/19- 0% improvement, 05/19/19- 80% improved    Time  4    Period  Weeks    Status  Achieved            Plan - 06/24/19 0848    Clinical Impression Statement  Continued Lt shoulder ROM work after implant switch.  Pt has gained some AROM into abduction since last visit and is tolerating all activities well.    PT Treatment/Interventions  ADLs/Self Care Home Management;Therapeutic exercise;Patient/family education;Manual techniques;Manual lymph drainage;Compression bandaging;Scar mobilization;Passive range of motion;Therapeutic activities    PT Next Visit Plan  PROM to L shoulder, STM to L pec, continue scapular strengthening, once ROM improves give Strength ABC program       Patient will benefit from skilled therapeutic intervention in order to improve the following deficits and impairments:     Visit Diagnosis: Stiffness of left shoulder, not elsewhere classified  Aftercare following surgery for neoplasm  Acute pain of left shoulder  Muscle weakness (generalized)  At risk for lymphedema     Problem List Patient Active Problem List   Diagnosis Date Noted  . Benign essential HTN 08/17/2018  . Morbid obesity with body mass index (BMI) of 40.0 to 49.9 ([place]) 09/17/2017  . 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 Malignant neoplasm of upper-inner quadrant of left breast in female, estrogen receptor positive ([place]) 07/27/2017  . Genetic testing 03/25/2017  . Malignant 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, estrogen receptor positive ([place]) 01/23/2017  . Carcinoma of breast  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, left ([place]) 01/23/2017    Stark Bray 06/24/2019, 8:51 AM  [place], [place], [address] Phone: [phone]   Fax:  [phone]  Name: JESSICAANN OVERBAUGH MRN: [id number] Date of Birth: 10/30/1970

## 2019-06-27 ENCOUNTER — Inpatient Hospital Stay: Payer: 59 | Attending: Adult Health

## 2019-06-27 ENCOUNTER — Telehealth: Payer: Self-pay | Admitting: Medical Oncology

## 2019-06-27 ENCOUNTER — Other Ambulatory Visit: Payer: Self-pay

## 2019-06-27 ENCOUNTER — Inpatient Hospital Stay: Payer: 59

## 2019-06-27 VITALS — BP 151/89 | HR 71 | Temp 98.7°F | Resp 18

## 2019-06-27 DIAGNOSIS — C50812 Malignant neoplasm of overlapping sites of left female breast: Secondary | ICD-10-CM

## 2019-06-27 DIAGNOSIS — Z5111 Encounter for antineoplastic chemotherapy: Secondary | ICD-10-CM | POA: Diagnosis not present

## 2019-06-27 DIAGNOSIS — Z170 Estrogen receptor positive status [ER+]: Secondary | ICD-10-CM | POA: Diagnosis not present

## 2019-06-27 DIAGNOSIS — Z006 Encounter for examination for normal comparison and control in clinical research program: Secondary | ICD-10-CM | POA: Diagnosis not present

## 2019-06-27 LAB — CMP (CANCER CENTER ONLY)
ALT: 35 U/L (ref 0–44)
AST: 27 U/L (ref 15–41)
Albumin: 3.9 g/dL (ref 3.5–5.0)
Alkaline Phosphatase: 131 U/L — ABNORMAL HIGH (ref 38–126)
Anion gap: 9 (ref 5–15)
BUN: 16 mg/dL (ref 6–20)
CO2: 26 mmol/L (ref 22–32)
Calcium: 8.9 mg/dL (ref 8.9–10.3)
Chloride: 102 mmol/L (ref 98–111)
Creatinine: 1.02 mg/dL — ABNORMAL HIGH (ref 0.44–1.00)
GFR, Est AFR Am: >60 mL/min (ref >60)
GFR, Estimated: >60 mL/min (ref >60)
Glucose, Bld: 107 mg/dL — ABNORMAL HIGH (ref 70–99)
Potassium: 4 mmol/L (ref 3.5–5.1)
Sodium: 137 mmol/L (ref 135–145)
Total Bilirubin: 0.3 mg/dL (ref 0.3–1.2)
Total Protein: 7.6 g/dL (ref 6.5–8.1)

## 2019-06-27 LAB — CBC WITH DIFFERENTIAL (CANCER CENTER ONLY)
Abs Immature Granulocytes: 0.01 10*3/uL (ref 0.00–0.07)
Basophils Absolute: 0 10*3/uL (ref 0.0–0.1)
Basophils Relative: 1 %
Eosinophils Absolute: 0 10*3/uL (ref 0.0–0.5)
Eosinophils Relative: 1 %
HCT: 36.9 % (ref 36.0–46.0)
Hemoglobin: 12.1 g/dL (ref 12.0–15.0)
Immature Granulocytes: 0 %
Lymphocytes Relative: 30 %
Lymphs Abs: 1.2 10*3/uL (ref 0.7–4.0)
MCH: 28.5 pg (ref 26.0–34.0)
MCHC: 32.8 g/dL (ref 30.0–36.0)
MCV: 86.8 fL (ref 80.0–100.0)
Monocytes Absolute: 0.6 10*3/uL (ref 0.1–1.0)
Monocytes Relative: 14 %
Neutro Abs: 2.2 10*3/uL (ref 1.7–7.7)
Neutrophils Relative %: 54 %
Platelet Count: 225 10*3/uL (ref 150–400)
RBC: 4.25 MIL/uL (ref 3.87–5.11)
RDW: 15.2 % (ref 11.5–15.5)
WBC Count: 4.1 10*3/uL (ref 4.0–10.5)
nRBC: 0 % (ref 0.0–0.2)

## 2019-06-27 MED ORDER — GOSERELIN ACETATE 3.6 MG ~~LOC~~ IMPL
DRUG_IMPLANT | SUBCUTANEOUS | Status: AC
  Filled 2019-06-27: qty 3.6

## 2019-06-27 MED ORDER — GOSERELIN ACETATE 3.6 MG ~~LOC~~ IMPL
3.6000 mg | DRUG_IMPLANT | Freq: Once | SUBCUTANEOUS | Status: AC
  Administered 2019-06-27: 3.6 mg via SUBCUTANEOUS

## 2019-06-27 NOTE — Telephone Encounter (Signed)
[name] Patient in today for MD ordered labs and scheduled Goserelin injection. Today's labs are/were not required for study. Labs resulted and reviewed with Dr. Jana Hakim and per MD, patient ok to continue with Ribociclib. I called and spoke with patient and informed her of her lab results and per MD she is fine to continue with Ribociclib. All patient's questions to her satisfaction. Patient informed her research nurse, Foye Spurling will return to office tomorrow and for patient to call with any questions or concerns. Patient thanked for her time. Maxwell Marion, RN, BSN, [facility] Clinical Research 06/27/2019 11:04 AM

## 2019-06-27 NOTE — Patient Instructions (Signed)

## 2019-06-28 ENCOUNTER — Ambulatory Visit: Payer: 59 | Admitting: Physical Therapy

## 2019-06-28 ENCOUNTER — Encounter: Payer: Self-pay | Admitting: Physical Therapy

## 2019-06-28 DIAGNOSIS — M25512 Pain in left shoulder: Secondary | ICD-10-CM | POA: Diagnosis not present

## 2019-06-28 DIAGNOSIS — Z483 Aftercare following surgery for neoplasm: Secondary | ICD-10-CM

## 2019-06-28 DIAGNOSIS — R252 Cramp and spasm: Secondary | ICD-10-CM | POA: Diagnosis not present

## 2019-06-28 DIAGNOSIS — M6281 Muscle weakness (generalized): Secondary | ICD-10-CM | POA: Diagnosis not present

## 2019-06-28 DIAGNOSIS — M25612 Stiffness of left shoulder, not elsewhere classified: Secondary | ICD-10-CM

## 2019-06-28 DIAGNOSIS — Z9189 Other specified personal risk factors, not elsewhere classified: Secondary | ICD-10-CM | POA: Diagnosis not present

## 2019-06-28 DIAGNOSIS — R278 Other lack of coordination: Secondary | ICD-10-CM | POA: Diagnosis not present

## 2019-06-28 NOTE — Therapy (Signed)
[place], [place], [address] Phone: [phone]   Fax:  [phone]  Physical Therapy Treatment  Patient Details  Name: Mackenzie [name] MRN: [id number] Date of Birth: 19-Jun-1970 Referring Provider (PT): Dillingham   Encounter Date: 06/28/2019  PT End of Session - 06/28/19 1654    Visit Number  18    Number of Visits  24    Date for PT Re-Evaluation  07/19/19    PT Start Time  1606    PT Stop Time  1650    PT Time Calculation (min)  44 min    Activity Tolerance  Patient tolerated treatment well    Behavior During Therapy  [facility] for tasks assessed/performed       Past Medical History:  Diagnosis Date  . Abnormal glucose 2018  . 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 Allergic rhinitis 2012  . Anemia 01/27/2017   prior to starting chemotherapy  . Breast cancer ([place]) 01/14/2017   Left breast  . 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 Fatty liver   . Hot flashes 03/2017  . Hyperlipidemia 03/20/2016  . Morbid obesity with body mass index (BMI) of 40.0 to 49.9 ([place]) 09/17/2017  . NCGS (non-celiac gluten sensitivity)   . Pre-diabetes   . Seasonal allergies 2012   seasonal allergies causes allergic rhinitis and itchy, dry eyes per pt  . Vitamin D deficiency 05/2015    Past Surgical History:  Procedure Laterality Date  . BREAST RECONSTRUCTION WITH PLACEMENT OF TISSUE EXPANDER AND FLEX HD (ACELLULAR HYDRATED DERMIS) Left 08/27/2017   Procedure: LEFT BREAST RECONSTRUCTION WITH PLACEMENT OF TISSUE EXPANDER AND FLEX HD;  Surgeon: Wallace Going, DO;  Location: [place];  Service: Plastics;  Laterality: Left;  . CESAREAN SECTION     x2  . LATISSIMUS FLAP TO BREAST Left 11/03/2018  . LATISSIMUS FLAP TO BREAST Left 11/03/2018   Procedure: LATISSIMUS FLAP TO LEFT BREAST;  Surgeon: Wallace Going, DO;  Location: [place];  Service: Plastics;  Laterality:  Left;  Marland Kitchen MASTECTOMY MODIFIED RADICAL Left 08/27/2017  . MASTECTOMY MODIFIED RADICAL Left 08/27/2017   Procedure: LEFT MODIFIED RADICAL MASTECTOMY;  Surgeon: Erroll Luna, MD;  Location: [place];  Service: General;  Laterality: Left;  Marland Kitchen MASTOPEXY Right 03/03/2019   Procedure: RIGHT BREAST MASTOPEXY/REDUCTION;  Surgeon: Wallace Going, DO;  Location: [place];  Service: Plastics;  Laterality: Right;  3 hours, please  . PORT-A-CATH REMOVAL Right 08/27/2017   Procedure: REMOVAL PORT-A-CATH RIGHT CHEST;  Surgeon: Erroll Luna, MD;  Location: [place];  Service: General;  Laterality: Right;  . PORTACATH PLACEMENT Right 01/28/2017   Procedure: INSERTION PORT-A-CATH WITH ULTRASOUND;  Surgeon: Erroll Luna, MD;  Location: [facility] [place];  Service: General;  Laterality: Right;  . REMOVAL OF TISSUE EXPANDER AND PLACEMENT OF IMPLANT Left 03/03/2019   Procedure: REMOVAL OF TISSUE EXPANDER AND PLACEMENT OF EXPANDER;  Surgeon: Wallace Going, DO;  Location: [place];  Service: Plastics;  Laterality: Left;  . REMOVAL OF TISSUE EXPANDER AND PLACEMENT OF IMPLANT Left 05/25/2019   Procedure: LEFT BREAST REMOVAL OF TISSUE EXPANDER AND PLACEMENT OF IMPLANT;  Surgeon: Wallace Going, DO;  Location: [place];  Service: Plastics;  Laterality: Left;  . TISSUE EXPANDER PLACEMENT Left 11/03/2018   Procedure: PLACEMENT OF TISSUE EXPANDER LEFT BREAST;  Surgeon: Wallace Going, DO;  Location: [place];  Service: Plastics;  Laterality: Left;  Total case time is 3.5 hours  . TUBAL LIGATION Bilateral 01/21/2011    There were no vitals filed for this visit.  Subjective Assessment - 06/28/19 1618    Subjective  My shoulder is feeling stiff today.    Pertinent History  Left breast cancer diagnosed 01/10/17 by mammogram and needle biopsy. Then had neo-adjuvant chemo starting 02/10/17 until 06/26/17; had left mastectomy 08/27/17 with ALND and immediate  expander placed. Radiation completed. Expander removed and replaced on May 27, 19 b/c it did not get expanded enough prior to radiation, also had lat flap 11/02/17. Had expander removed and replaced again on 03/03/19 due to a small leak, pt had reduction on R breast at this time    Patient Stated Goals  to get strength back and ROM, be able to unhook bra easily    Currently in Pain?  No/denies    Pain Score  0-No pain                       [facility] Adult PT Treatment/Exercise - 06/28/19 0001      Shoulder Exercises: Supine   Protraction  Both;10 reps    Protraction Limitations  pro/ret x 10 with cueing to keep elbows straight      Shoulder Exercises: Pulleys   Flexion  2 minutes    ABduction  2 minutes      Shoulder Exercises: Therapy Ball   Flexion  Both;10 reps    ABduction  Left;10 reps      Manual Therapy   Soft tissue mobilization  in supine to Lt pec muscle     Passive ROM  to Lt shoulder in direction of abduction, flexion, and ER with prolonged holds while performing STM to L pec muscle to decrease tightness                  PT Long Term Goals - 06/21/19 1514      PT LONG TERM GOAL #1   Title  Pt will demonstrate 160 degrees of left shoulder flexion to allow her to reach overhead.    Baseline  137, 04/26/19- 141, 05/19/19- 150, 06/21/19- 143    Time  4    Period  Weeks    Status  On-going      PT LONG TERM GOAL #2   Title  Pt will demonstrate 160 degrees of left shoulder abduction to allow her to reach out to the side    Baseline  135, 04/26/19- 160, 06/21/19- 135    Time  4    Period  Weeks    Status  On-going      PT LONG TERM GOAL #3   Title  Pt will report an 80% improvement in pain and tightness in L axilla to allow pt to move LUE without discomfort.    Baseline  04/26/19- 60% improved, 05/19/19- 75% improvement, 06/21/19- 65% improvement    Time  4    Period  Weeks    Status  On-going      PT LONG TERM GOAL #4   Title  Pt will be  independent with a home exercise program for continued strengthening and stretching    Time  4    Period  Weeks    Status  On-going      PT LONG TERM GOAL #5   Title  Pt will report a 50% improvement in back pain in area of lat flap when walking to allow improved  comfort    Baseline  04/26/19- 0% improvement, 05/19/19- 80% improved    Time  4    Period  Weeks    Status  Achieved            Plan - 06/28/19 1655    Clinical Impression Statement  Pt felt more tight at beginning of session today. Continued to focus on manual therapy to improve L shoulder ROM. She has increased tightness across L pec which limits left shoulder ROM. Continued with soft tissue mobilization to this area.    PT Frequency  2x / week    PT Duration  4 weeks    PT Treatment/Interventions  ADLs/Self Care Home Management;Therapeutic exercise;Patient/family education;Manual techniques;Manual lymph drainage;Compression bandaging;Scar mobilization;Passive range of motion;Therapeutic activities    PT Next Visit Plan  PROM to L shoulder, STM to L pec, continue scapular strengthening, once ROM improves give Strength ABC program    PT Home Exercise Plan  Access Code: [email or web address], supine over foam roll, rockwood for LUE, wall washing, 3 way raises, corner stretch, post op stretches for after surgery of Dr. Sanjuana Mae and Agree with Plan of Care  Patient       Patient will benefit from skilled therapeutic intervention in order to improve the following deficits and impairments:  Decreased range of motion, Impaired UE functional use, Decreased strength, Other (comment), Pain, Postural dysfunction  Visit Diagnosis: Stiffness of left shoulder, not elsewhere classified  Aftercare following surgery for neoplasm  Acute pain of left shoulder  Muscle weakness (generalized)     Problem List Patient Active Problem List   Diagnosis Date Noted  . Benign essential HTN 08/17/2018  . Morbid obesity with body mass index  (BMI) of 40.0 to 49.9 ([place]) 09/17/2017  . 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 Malignant neoplasm of upper-inner quadrant of left breast in female, estrogen receptor positive ([place]) 07/27/2017  . Genetic testing 03/25/2017  . Malignant 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, estrogen receptor positive ([place]) 01/23/2017  . 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

## 2019-06-30 ENCOUNTER — Encounter: Payer: Self-pay | Admitting: Physical Therapy

## 2019-06-30 ENCOUNTER — Other Ambulatory Visit: Payer: Self-pay

## 2019-06-30 ENCOUNTER — Ambulatory Visit: Payer: 59 | Admitting: Physical Therapy

## 2019-06-30 DIAGNOSIS — M25612 Stiffness of left shoulder, not elsewhere classified: Secondary | ICD-10-CM

## 2019-06-30 DIAGNOSIS — M6281 Muscle weakness (generalized): Secondary | ICD-10-CM | POA: Diagnosis not present

## 2019-06-30 DIAGNOSIS — Z483 Aftercare following surgery for neoplasm: Secondary | ICD-10-CM

## 2019-06-30 DIAGNOSIS — Z9189 Other specified personal risk factors, not elsewhere classified: Secondary | ICD-10-CM | POA: Diagnosis not present

## 2019-06-30 DIAGNOSIS — R278 Other lack of coordination: Secondary | ICD-10-CM | POA: Diagnosis not present

## 2019-06-30 DIAGNOSIS — M25512 Pain in left shoulder: Secondary | ICD-10-CM | POA: Diagnosis not present

## 2019-06-30 DIAGNOSIS — R252 Cramp and spasm: Secondary | ICD-10-CM | POA: Diagnosis not present

## 2019-06-30 NOTE — Therapy (Signed)
[facility] [place], [place], [address] Phone: [phone]   Fax:  [phone]  Physical Therapy Treatment  Patient Details  Name: Mackenzie [name] MRN: [id number] Date of Birth: 1970/12/06 Referring Provider (PT): Dillingham   Encounter Date: 06/30/2019  PT End of Session - 06/30/19 1703    Visit Number  19    Number of Visits  24    Date for PT Re-Evaluation  07/19/19    PT Start Time  [id number]    PT Stop Time  1655   all time not billable due to safety drill during session   PT Time Calculation (min)  46 min    Activity Tolerance  Patient tolerated treatment well    Behavior During Therapy  [facility] for tasks assessed/performed       Past Medical History:  Diagnosis Date  . Abnormal glucose 2018  . 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 Allergic rhinitis 2012  . Anemia 01/27/2017   prior to starting chemotherapy  . Breast cancer ([place]) 01/14/2017   Left breast  . 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 Fatty liver   . Hot flashes 03/2017  . Hyperlipidemia 03/20/2016  . Morbid obesity with body mass index (BMI) of 40.0 to 49.9 ([place]) 09/17/2017  . NCGS (non-celiac gluten sensitivity)   . Pre-diabetes   . Seasonal allergies 2012   seasonal allergies causes allergic rhinitis and itchy, dry eyes per pt  . Vitamin D deficiency 05/2015    Past Surgical History:  Procedure Laterality Date  . BREAST RECONSTRUCTION WITH PLACEMENT OF TISSUE EXPANDER AND FLEX HD (ACELLULAR HYDRATED DERMIS) Left 08/27/2017   Procedure: LEFT BREAST RECONSTRUCTION WITH PLACEMENT OF TISSUE EXPANDER AND FLEX HD;  Surgeon: Wallace Going, DO;  Location: [facility];  Service: Plastics;  Laterality: Left;  . CESAREAN SECTION     x2  . LATISSIMUS FLAP TO BREAST Left 11/03/2018  . LATISSIMUS FLAP TO BREAST Left 11/03/2018   Procedure: LATISSIMUS FLAP TO LEFT BREAST;  Surgeon: Wallace Going,  DO;  Location: [facility];  Service: Plastics;  Laterality: Left;  Marland Kitchen MASTECTOMY MODIFIED RADICAL Left 08/27/2017  . MASTECTOMY MODIFIED RADICAL Left 08/27/2017   Procedure: LEFT MODIFIED RADICAL MASTECTOMY;  Surgeon: Erroll Luna, MD;  Location: [place];  Service: General;  Laterality: Left;  Marland Kitchen MASTOPEXY Right 03/03/2019   Procedure: RIGHT BREAST MASTOPEXY/REDUCTION;  Surgeon: Wallace Going, DO;  Location: [facility];  Service: Plastics;  Laterality: Right;  3 hours, please  . PORT-A-CATH REMOVAL Right 08/27/2017   Procedure: REMOVAL PORT-A-CATH RIGHT CHEST;  Surgeon: Erroll Luna, MD;  Location: [facility];  Service: General;  Laterality: Right;  . PORTACATH PLACEMENT Right 01/28/2017   Procedure: INSERTION PORT-A-CATH WITH ULTRASOUND;  Surgeon: Erroll Luna, MD;  Location: [facility];  Service: General;  Laterality: Right;  . REMOVAL OF TISSUE EXPANDER AND PLACEMENT OF IMPLANT Left 03/03/2019   Procedure: REMOVAL OF TISSUE EXPANDER AND PLACEMENT OF EXPANDER;  Surgeon: Wallace Going, DO;  Location: [facility];  Service: Plastics;  Laterality: Left;  . REMOVAL OF TISSUE EXPANDER AND PLACEMENT OF IMPLANT Left 05/25/2019   Procedure: LEFT BREAST REMOVAL OF TISSUE EXPANDER AND PLACEMENT OF IMPLANT;  Surgeon: Wallace Going, DO;  Location: [facility];  Service: Plastics;  Laterality: Left;  . TISSUE EXPANDER PLACEMENT Left 11/03/2018   Procedure: PLACEMENT OF TISSUE EXPANDER LEFT BREAST;  Surgeon:  Dillingham, Loel Lofty, DO;  [place];  Service: Plastics;  Laterality: Left;  Total case time is 3.5 hours  . TUBAL LIGATION Bilateral 01/21/2011    There were no vitals filed for this visit.  Subjective Assessment - 06/30/19 1654    Subjective  My shoulder did well after last time.    Pertinent History  Left breast cancer diagnosed 01/10/17 by mammogram and needle biopsy. Then had neo-adjuvant chemo starting 02/10/17 until 06/26/17;  had left mastectomy 08/27/17 with ALND and immediate expander placed. Radiation completed. Expander removed and replaced on May 27, 19 b/c it did not get expanded enough prior to radiation, also had lat flap 11/02/17. Had expander removed and replaced again on 03/03/19 due to a small leak, pt had reduction on R breast at this time    Patient Stated Goals  to get strength back and ROM, be able to unhook bra easily    Currently in Pain?  No/denies    Pain Score  0-No pain         [facility] PT Assessment - 06/30/19 0001      AROM   Left Shoulder Flexion  155 Degrees   AROM after stretching   Left Shoulder ABduction  169 Degrees   AROM after stretching                  [facility] Adult PT Treatment/Exercise - 06/30/19 0001      Shoulder Exercises: Pulleys   Flexion  2 minutes    ABduction  2 minutes      Shoulder Exercises: Therapy Ball   Flexion  Both;10 reps    ABduction  Left;10 reps      Manual Therapy   Soft tissue mobilization  in supine to Lt pec muscle     Passive ROM  to Lt shoulder in direction of abduction, flexion, and ER with prolonged holds while performing STM to L pec muscle to decrease tightness                  PT Long Term Goals - 06/21/19 1514      PT LONG TERM GOAL #1   Title  Pt will demonstrate 160 degrees of left shoulder flexion to allow her to reach overhead.    Baseline  137, 04/26/19- 141, 05/19/19- 150, 06/21/19- 143    Time  4    Period  Weeks    Status  On-going      PT LONG TERM GOAL #2   Title  Pt will demonstrate 160 degrees of left shoulder abduction to allow her to reach out to the side    Baseline  135, 04/26/19- 160, 06/21/19- 135    Time  4    Period  Weeks    Status  On-going      PT LONG TERM GOAL #3   Title  Pt will report an 80% improvement in pain and tightness in L axilla to allow pt to move LUE without discomfort.    Baseline  04/26/19- 60% improved, 05/19/19- 75% improvement, 06/21/19- 65% improvement    Time  4     Period  Weeks    Status  On-going      PT LONG TERM GOAL #4   Title  Pt will be independent with a home exercise program for continued strengthening and stretching    Time  4    Period  Weeks    Status  On-going      PT LONG TERM  GOAL #5   Title  Pt will report a 50% improvement in back pain in area of lat flap when walking to allow improved comfort    Baseline  04/26/19- 0% improvement, 05/19/19- 80% improved    Time  4    Period  Weeks    Status  Achieved            Plan - 06/30/19 1703    Clinical Impression Statement  Pt felt she maintained gains from last session and was not as tight today. She is still having tingling in her left and has been wearing the sleeve but does not wear the glove to work due to hand hygeine and frequent hand washing. Educated pt it is important to wear glove as well so fluid is not pushed towards the hand. Measured AROM following session today and it has greatly improved from last week especially in direction of abduction which is now nearly full.    PT Frequency  2x / week    PT Duration  4 weeks    PT Treatment/Interventions  ADLs/Self Care Home Management;Therapeutic exercise;Patient/family education;Manual techniques;Manual lymph drainage;Compression bandaging;Scar mobilization;Passive range of motion;Therapeutic activities    PT Next Visit Plan  measure ROM at beginning of session, PROM to L shoulder, STM to L pec, continue scapular strengthening, once ROM improves give Strength ABC program    PT Home Exercise Plan  Access Code: [email or web address], supine over foam roll, rockwood for LUE, wall washing, 3 way raises, corner stretch, post op stretches for after surgery of Dr. Sanjuana Mae and Agree with Plan of Care  Patient       Patient will benefit from skilled therapeutic intervention in order to improve the following deficits and impairments:  Decreased range of motion, Impaired UE functional use, Decreased strength, Other (comment), Pain,  Postural dysfunction  Visit Diagnosis: Stiffness of left shoulder, not elsewhere classified  Aftercare following surgery for neoplasm  Acute pain of left shoulder  Muscle weakness (generalized)     Problem List Patient Active Problem List   Diagnosis Date Noted  . Benign essential HTN 08/17/2018  . Morbid obesity with body mass index (BMI) of 40.0 to 49.9 ([place]) 09/17/2017  . 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 Malignant neoplasm of upper-inner quadrant of left breast in female, estrogen receptor positive ([place]) 07/27/2017  . Genetic testing 03/25/2017  . Malignant 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, estrogen receptor positive ([place]) 01/23/2017  . 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, [place], [address] Phone: [phone]   Fax:  [phone]  Name: KANESSA MOVA MRN: [id number] Date of Birth: April 17, 1971  Manus Gunning, PT 06/30/19 5:07 PM

## 2019-07-05 ENCOUNTER — Encounter: Payer: Self-pay | Admitting: Physical Therapy

## 2019-07-05 ENCOUNTER — Ambulatory Visit: Payer: 59 | Admitting: Physical Therapy

## 2019-07-05 ENCOUNTER — Other Ambulatory Visit: Payer: Self-pay

## 2019-07-05 DIAGNOSIS — Z9189 Other specified personal risk factors, not elsewhere classified: Secondary | ICD-10-CM | POA: Diagnosis not present

## 2019-07-05 DIAGNOSIS — M25512 Pain in left shoulder: Secondary | ICD-10-CM | POA: Diagnosis not present

## 2019-07-05 DIAGNOSIS — M6281 Muscle weakness (generalized): Secondary | ICD-10-CM | POA: Diagnosis not present

## 2019-07-05 DIAGNOSIS — M25612 Stiffness of left shoulder, not elsewhere classified: Secondary | ICD-10-CM | POA: Diagnosis not present

## 2019-07-05 DIAGNOSIS — Z483 Aftercare following surgery for neoplasm: Secondary | ICD-10-CM | POA: Diagnosis not present

## 2019-07-05 DIAGNOSIS — R278 Other lack of coordination: Secondary | ICD-10-CM | POA: Diagnosis not present

## 2019-07-05 DIAGNOSIS — R252 Cramp and spasm: Secondary | ICD-10-CM | POA: Diagnosis not present

## 2019-07-05 NOTE — Therapy (Signed)
[place], [place], [address] Phone: [phone]   Fax:  [phone]  Physical Therapy Treatment  Patient Details  Name: Mackenzie [name] MRN: [id number] Date of Birth: 02-28-1971 Referring Provider (PT): Dillingham   Encounter Date: 07/05/2019  PT End of Session - 07/05/19 1657    Visit Number  20    Number of Visits  24    Date for PT Re-Evaluation  07/19/19    PT Start Time  [id number]   pt arrived late   PT Stop Time  1653    PT Time Calculation (min)  36 min    Activity Tolerance  Patient tolerated treatment well    Behavior During Therapy  [facility] for tasks assessed/performed       Past Medical History:  Diagnosis Date  . Abnormal glucose 2018  . 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 Allergic rhinitis 2012  . Anemia 01/27/2017   prior to starting chemotherapy  . Breast cancer ([place]) 01/14/2017   Left breast  . 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 Fatty liver   . Hot flashes 03/2017  . Hyperlipidemia 03/20/2016  . Morbid obesity with body mass index (BMI) of 40.0 to 49.9 ([place]) 09/17/2017  . NCGS (non-celiac gluten sensitivity)   . Pre-diabetes   . Seasonal allergies 2012   seasonal allergies causes allergic rhinitis and itchy, dry eyes per pt  . Vitamin D deficiency 05/2015    Past Surgical History:  Procedure Laterality Date  . BREAST RECONSTRUCTION WITH PLACEMENT OF TISSUE EXPANDER AND FLEX HD (ACELLULAR HYDRATED DERMIS) Left 08/27/2017   Procedure: LEFT BREAST RECONSTRUCTION WITH PLACEMENT OF TISSUE EXPANDER AND FLEX HD;  Surgeon: Wallace Going, DO;  Location: [facility];  Service: Plastics;  Laterality: Left;  . CESAREAN SECTION     x2  . LATISSIMUS FLAP TO BREAST Left 11/03/2018  . LATISSIMUS FLAP TO BREAST Left 11/03/2018   Procedure: LATISSIMUS FLAP TO LEFT BREAST;  Surgeon: Wallace Going, DO;  Location: [facility];  Service:  Plastics;  Laterality: Left;  Marland Kitchen MASTECTOMY MODIFIED RADICAL Left 08/27/2017  . MASTECTOMY MODIFIED RADICAL Left 08/27/2017   Procedure: LEFT MODIFIED RADICAL MASTECTOMY;  Surgeon: Erroll Luna, MD;  Location: [facility];  Service: General;  Laterality: Left;  Marland Kitchen MASTOPEXY Right 03/03/2019   Procedure: RIGHT BREAST MASTOPEXY/REDUCTION;  Surgeon: Wallace Going, DO;  Location: [facility];  Service: Plastics;  Laterality: Right;  3 hours, please  . PORT-A-CATH REMOVAL Right 08/27/2017   Procedure: REMOVAL PORT-A-CATH RIGHT CHEST;  Surgeon: Erroll Luna, MD;  Location: [facility];  Service: General;  Laterality: Right;  . PORTACATH PLACEMENT Right 01/28/2017   Procedure: INSERTION PORT-A-CATH WITH ULTRASOUND;  Surgeon: Erroll Luna, MD;  Location: [facility];  Service: General;  Laterality: Right;  . REMOVAL OF TISSUE EXPANDER AND PLACEMENT OF IMPLANT Left 03/03/2019   Procedure: REMOVAL OF TISSUE EXPANDER AND PLACEMENT OF EXPANDER;  Surgeon: Wallace Going, DO;  Location: [facility];  Service: Plastics;  Laterality: Left;  . REMOVAL OF TISSUE EXPANDER AND PLACEMENT OF IMPLANT Left 05/25/2019   Procedure: LEFT BREAST REMOVAL OF TISSUE EXPANDER AND PLACEMENT OF IMPLANT;  Surgeon: Wallace Going, DO;  Location: [facility];  Service: Plastics;  Laterality: Left;  . TISSUE EXPANDER PLACEMENT Left 11/03/2018   Procedure: PLACEMENT OF TISSUE EXPANDER LEFT BREAST;  Surgeon: Wallace Going, DO;  [facility]: [facility]  OR;  Service: Clinical cytogeneticist;  Laterality: Left;  Total case time is 3.5 hours  . TUBAL LIGATION Bilateral 01/21/2011    There were no vitals filed for this visit.  Subjective Assessment - 07/05/19 1619    Subjective  My shoulder feels okay.    Pertinent History  Left breast cancer diagnosed 01/10/17 by mammogram and needle biopsy. Then had neo-adjuvant chemo starting 02/10/17 until 06/26/17; had left mastectomy 08/27/17 with ALND and  immediate expander placed. Radiation completed. Expander removed and replaced on May 27, 19 b/c it did not get expanded enough prior to radiation, also had lat flap 11/02/17. Had expander removed and replaced again on 03/03/19 due to a small leak, pt had reduction on R breast at this time    Patient Stated Goals  to get strength back and ROM, be able to unhook bra easily    Currently in Pain?  No/denies    Pain Score  0-No pain         [facility] PT Assessment - 07/05/19 0001      AROM   Left Shoulder Flexion  154 Degrees    Left Shoulder ABduction  158 Degrees                   [facility] Adult PT Treatment/Exercise - 07/05/19 0001      Shoulder Exercises: Pulleys   Flexion  2 minutes    ABduction  2 minutes      Shoulder Exercises: Therapy Ball   Flexion  Both;10 reps    ABduction  Left;10 reps      Manual Therapy   Soft tissue mobilization  in supine to Lt pec muscle     Passive ROM  to Lt shoulder in direction of abduction, flexion, and ER with prolonged holds while performing STM to L pec muscle to decrease tightness                  PT Long Term Goals - 06/21/19 1514      PT LONG TERM GOAL #1   Title  Pt will demonstrate 160 degrees of left shoulder flexion to allow her to reach overhead.    Baseline  137, 04/26/19- 141, 05/19/19- 150, 06/21/19- 143    Time  4    Period  Weeks    Status  On-going      PT LONG TERM GOAL #2   Title  Pt will demonstrate 160 degrees of left shoulder abduction to allow her to reach out to the side    Baseline  135, 04/26/19- 160, 06/21/19- 135    Time  4    Period  Weeks    Status  On-going      PT LONG TERM GOAL #3   Title  Pt will report an 80% improvement in pain and tightness in L axilla to allow pt to move LUE without discomfort.    Baseline  04/26/19- 60% improved, 05/19/19- 75% improvement, 06/21/19- 65% improvement    Time  4    Period  Weeks    Status  On-going      PT LONG TERM GOAL #4   Title  Pt will be  independent with a home exercise program for continued strengthening and stretching    Time  4    Period  Weeks    Status  On-going      PT LONG TERM GOAL #5   Title  Pt will report a 50% improvement in back pain in area of  lat flap when walking to allow improved comfort    Baseline  04/26/19- 0% improvement, 05/19/19- 80% improved    Time  4    Period  Weeks    Status  Achieved            Plan - 07/05/19 1658    Clinical Impression Statement  Measured AROM of left shoulder at beginning of session today and it has improved greatly and is nearly what it was after stretching at end of last session. Instructed pt in corner stretch and doorway stretch to help loosen pec muscle. Continued with PROM with prolonged holds to improve left shoulder ROM. Will instruct pt in Strength ABC program at next session.    PT Frequency  2x / week    PT Duration  4 weeks    PT Treatment/Interventions  ADLs/Self Care Home Management;Therapeutic exercise;Patient/family education;Manual techniques;Manual lymph drainage;Compression bandaging;Scar mobilization;Passive range of motion;Therapeutic activities    PT Next Visit Plan  instruct in Strength ABC today, measure ROM at beginning of session, PROM to L shoulder, STM to L pec, continue scapular strengthening    PT Home Exercise Plan  Access Code: [email or web address], supine over foam roll, rockwood for LUE, wall washing, 3 way raises, corner stretch, post op stretches for after surgery of Dr. Sanjuana Mae and Agree with Plan of Care  Patient       Patient will benefit from skilled therapeutic intervention in order to improve the following deficits and impairments:  Decreased range of motion, Impaired UE functional use, Decreased strength, Other (comment), Pain, Postural dysfunction  Visit Diagnosis: Stiffness of left shoulder, not elsewhere classified  Aftercare following surgery for neoplasm  Acute pain of left shoulder     Problem List Patient Active  Problem List   Diagnosis Date Noted  . Benign essential HTN 08/17/2018  . Morbid obesity with body mass index (BMI) of 40.0 to 49.9 ([place]) 09/17/2017  . 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 Malignant neoplasm of upper-inner quadrant of left breast in female, estrogen receptor positive ([place]) 07/27/2017  . Genetic testing 03/25/2017  . Malignant 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, estrogen receptor positive ([place]) 01/23/2017  . 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

## 2019-07-06 ENCOUNTER — Ambulatory Visit
Admission: RE | Admit: 2019-07-06 | Discharge: 2019-07-06 | Disposition: A | Discharge status: Home / Self Care | Payer: 59 | Source: Ambulatory Visit | Attending: Adult Health | Admitting: Adult Health

## 2019-07-06 DIAGNOSIS — C50912 Malignant neoplasm of unspecified site of left female breast: Secondary | ICD-10-CM

## 2019-07-06 DIAGNOSIS — K8020 Calculus of gallbladder without cholecystitis without obstruction: Secondary | ICD-10-CM | POA: Diagnosis not present

## 2019-07-06 DIAGNOSIS — K7689 Other specified diseases of liver: Secondary | ICD-10-CM | POA: Diagnosis not present

## 2019-07-06 MED ORDER — GADOBENATE DIMEGLUMINE 529 MG/ML IV SOLN
20.0000 mL | Freq: Once | INTRAVENOUS | Status: AC | PRN
  Administered 2019-07-06: 20 mL via INTRAVENOUS

## 2019-07-07 ENCOUNTER — Ambulatory Visit: Payer: 59 | Admitting: Physical Therapy

## 2019-07-07 ENCOUNTER — Telehealth: Payer: Self-pay | Admitting: Adult Health

## 2019-07-07 ENCOUNTER — Encounter: Payer: Self-pay | Admitting: Physical Therapy

## 2019-07-07 ENCOUNTER — Other Ambulatory Visit: Payer: Self-pay

## 2019-07-07 DIAGNOSIS — R252 Cramp and spasm: Secondary | ICD-10-CM | POA: Diagnosis not present

## 2019-07-07 DIAGNOSIS — M25612 Stiffness of left shoulder, not elsewhere classified: Secondary | ICD-10-CM | POA: Diagnosis not present

## 2019-07-07 DIAGNOSIS — Z483 Aftercare following surgery for neoplasm: Secondary | ICD-10-CM | POA: Diagnosis not present

## 2019-07-07 DIAGNOSIS — M25512 Pain in left shoulder: Secondary | ICD-10-CM | POA: Diagnosis not present

## 2019-07-07 DIAGNOSIS — M6281 Muscle weakness (generalized): Secondary | ICD-10-CM

## 2019-07-07 DIAGNOSIS — Z9189 Other specified personal risk factors, not elsewhere classified: Secondary | ICD-10-CM | POA: Diagnosis not present

## 2019-07-07 DIAGNOSIS — R278 Other lack of coordination: Secondary | ICD-10-CM | POA: Diagnosis not present

## 2019-07-07 NOTE — Therapy (Signed)
[place], [place], [address] Phone: [phone]   Fax:  [phone]  Physical Therapy Treatment  Patient Details  Name: Mackenzie [name] MRN: [id number] Date of Birth: 1971-04-30 Referring Provider (PT): Dillingham   Encounter Date: 07/07/2019  PT End of Session - 07/07/19 1700    Visit Number  21    Number of Visits  24    Date for PT Re-Evaluation  07/19/19    PT Start Time  1608   pt arrived late   PT Stop Time  1653    PT Time Calculation (min)  45 min    Activity Tolerance  Patient tolerated treatment well    Behavior During Therapy  [facility] for tasks assessed/performed       Past Medical History:  Diagnosis Date  . Abnormal glucose 2018  . 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 Allergic rhinitis 2012  . Anemia 01/27/2017   prior to starting chemotherapy  . Breast cancer ([place]) 01/14/2017   Left breast  . 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 Fatty liver   . Hot flashes 03/2017  . Hyperlipidemia 03/20/2016  . Morbid obesity with body mass index (BMI) of 40.0 to 49.9 ([place]) 09/17/2017  . NCGS (non-celiac gluten sensitivity)   . Pre-diabetes   . Seasonal allergies 2012   seasonal allergies causes allergic rhinitis and itchy, dry eyes per pt  . Vitamin D deficiency 05/2015    Past Surgical History:  Procedure Laterality Date  . BREAST RECONSTRUCTION WITH PLACEMENT OF TISSUE EXPANDER AND FLEX HD (ACELLULAR HYDRATED DERMIS) Left 08/27/2017   Procedure: LEFT BREAST RECONSTRUCTION WITH PLACEMENT OF TISSUE EXPANDER AND FLEX HD;  Surgeon: Wallace Going, DO;  Location: [place];  Service: Plastics;  Laterality: Left;  . CESAREAN SECTION     x2  . LATISSIMUS FLAP TO BREAST Left 11/03/2018  . LATISSIMUS FLAP TO BREAST Left 11/03/2018   Procedure: LATISSIMUS FLAP TO LEFT BREAST;  Surgeon: Wallace Going, DO;  Location: [place];  Service:  Plastics;  Laterality: Left;  Marland Kitchen MASTECTOMY MODIFIED RADICAL Left 08/27/2017  . MASTECTOMY MODIFIED RADICAL Left 08/27/2017   Procedure: LEFT MODIFIED RADICAL MASTECTOMY;  Surgeon: Erroll Luna, MD;  Location: [place] [facility];  Service: General;  Laterality: Left;  Marland Kitchen MASTOPEXY Right 03/03/2019   Procedure: RIGHT BREAST MASTOPEXY/REDUCTION;  Surgeon: Wallace Going, DO;  Location: [place];  Service: Plastics;  Laterality: Right;  3 hours, please  . PORT-A-CATH REMOVAL Right 08/27/2017   Procedure: REMOVAL PORT-A-CATH RIGHT CHEST;  Surgeon: Erroll Luna, MD;  Location: [place];  Service: General;  Laterality: Right;  . PORTACATH PLACEMENT Right 01/28/2017   Procedure: INSERTION PORT-A-CATH WITH ULTRASOUND;  Surgeon: Erroll Luna, MD;  Location: [facility];  Service: General;  Laterality: Right;  . REMOVAL OF TISSUE EXPANDER AND PLACEMENT OF IMPLANT Left 03/03/2019   Procedure: REMOVAL OF TISSUE EXPANDER AND PLACEMENT OF EXPANDER;  Surgeon: Wallace Going, DO;  Location: [facility];  Service: Plastics;  Laterality: Left;  . REMOVAL OF TISSUE EXPANDER AND PLACEMENT OF IMPLANT Left 05/25/2019   Procedure: LEFT BREAST REMOVAL OF TISSUE EXPANDER AND PLACEMENT OF IMPLANT;  Surgeon: Wallace Going, DO;  Location: [place];  Service: Plastics;  Laterality: Left;  . TISSUE EXPANDER PLACEMENT Left 11/03/2018   Procedure: PLACEMENT OF TISSUE EXPANDER LEFT BREAST;  Surgeon: Wallace Going, DO;  Location: [facility];  Service: Clinical cytogeneticist;  Laterality: Left;  Total case time is 3.5 hours  . TUBAL LIGATION Bilateral 01/21/2011    There were no vitals filed for this visit.  Subjective Assessment - 07/07/19 1612    Subjective  I am having some back and breast pain today. I am not sure why. I feel ok other than that.    Pertinent History  Left breast cancer diagnosed 01/10/17 by mammogram and needle biopsy. Then had neo-adjuvant chemo starting  02/10/17 until 06/26/17; had left mastectomy 08/27/17 with ALND and immediate expander placed. Radiation completed. Expander removed and replaced on May 27, 19 b/c it did not get expanded enough prior to radiation, also had lat flap 11/02/17. Had expander removed and replaced again on 03/03/19 due to a small leak, pt had reduction on R breast at this time    Patient Stated Goals  to get strength back and ROM, be able to unhook bra easily    Currently in Pain?  No/denies    Pain Score  0-No pain         [facility] PT Assessment - 07/07/19 0001      AROM   Left Shoulder Flexion  146 Degrees    Left Shoulder ABduction  160 Degrees                   [facility] Adult PT Treatment/Exercise - 07/07/19 0001      Exercises   Exercises  Other Exercises    Other Exercises   Instructed pt in entire Strength ABC program today- pt held all stretches for 30 seconds bilaterally and used 3 lb weights for strengthening exercises x 10 reps, educated pt not to do squats if she has knee pain because she has crepitus in right knee when doing squats, stopped stair steps due to right knee pain. Educated pt on proper way to progress resistance                  PT Long Term Goals - 06/21/19 1514      PT LONG TERM GOAL #1   Title  Pt will demonstrate 160 degrees of left shoulder flexion to allow her to reach overhead.    Baseline  137, 04/26/19- 141, 05/19/19- 150, 06/21/19- 143    Time  4    Period  Weeks    Status  On-going      PT LONG TERM GOAL #2   Title  Pt will demonstrate 160 degrees of left shoulder abduction to allow her to reach out to the side    Baseline  135, 04/26/19- 160, 06/21/19- 135    Time  4    Period  Weeks    Status  On-going      PT LONG TERM GOAL #3   Title  Pt will report an 80% improvement in pain and tightness in L axilla to allow pt to move LUE without discomfort.    Baseline  04/26/19- 60% improved, 05/19/19- 75% improvement, 06/21/19- 65% improvement    Time  4     Period  Weeks    Status  On-going      PT LONG TERM GOAL #4   Title  Pt will be independent with a home exercise program for continued strengthening and stretching    Time  4    Period  Weeks    Status  On-going      PT LONG TERM GOAL #5   Title  Pt will report a 50%  improvement in back pain in area of lat flap when walking to allow improved comfort    Baseline  04/26/19- 0% improvement, 05/19/19- 80% improved    Time  4    Period  Weeks    Status  Achieved            Plan - 07/07/19 1701    Clinical Impression Statement  Educated pt in Strength ABC program today in its entirety. Pt return demonstrated all exercises and stretches. Educated pt on proper way to progress exercises at home as not to increase risk of lymphedema in LUE. Will assess independence with this at next visit. Pt's abduction shoulder AROM has improved since last visit prior to doing any stretches.    PT Frequency  2x / week    PT Duration  4 weeks    PT Treatment/Interventions  ADLs/Self Care Home Management;Therapeutic exercise;Patient/family education;Manual techniques;Manual lymph drainage;Compression bandaging;Scar mobilization;Passive range of motion;Therapeutic activities    PT Next Visit Plan  assess indep with strength ABC, assess goals    PT Home Exercise Plan  Access Code: [email or web address], supine over foam roll, rockwood for LUE, wall washing, 3 way raises, corner stretch, post op stretches for after surgery of Dr. Sherrine Maples, Strength ABC program    Consulted and Agree with Plan of Care  Patient       Patient will benefit from skilled therapeutic intervention in order to improve the following deficits and impairments:  Decreased range of motion, Impaired UE functional use, Decreased strength, Other (comment), Pain, Postural dysfunction  Visit Diagnosis: Stiffness of left shoulder, not elsewhere classified  Acute pain of left shoulder  Muscle weakness (generalized)     Problem List Patient Active Problem  List   Diagnosis Date Noted  . Benign essential HTN 08/17/2018  . Morbid obesity with body mass index (BMI) of 40.0 to 49.9 ([place]) 09/17/2017  . 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 Malignant neoplasm of upper-inner quadrant of left breast in female, estrogen receptor positive ([place]) 07/27/2017  . Genetic testing 03/25/2017  . Malignant 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, estrogen receptor positive ([place]) 01/23/2017  . 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

## 2019-07-07 NOTE — Telephone Encounter (Signed)
Called patient and reviewed liver MRI results that show cysts in the liver and no cancer.  I reviewed with her that she has cholelithiasis.  She is not having any symptoms.  She understands to let us know if she develops any RUQ pain/nausea, because at that point we would consider surgery eval. Patient verbalized understanding and appreciation.  [name], NP

## 2019-07-12 ENCOUNTER — Ambulatory Visit: Payer: 59 | Attending: Surgical | Admitting: Physical Therapy

## 2019-07-12 ENCOUNTER — Other Ambulatory Visit: Payer: Self-pay

## 2019-07-12 ENCOUNTER — Encounter: Payer: Self-pay | Admitting: Physical Therapy

## 2019-07-12 DIAGNOSIS — M25612 Stiffness of left shoulder, not elsewhere classified: Secondary | ICD-10-CM | POA: Diagnosis not present

## 2019-07-12 DIAGNOSIS — M25512 Pain in left shoulder: Secondary | ICD-10-CM | POA: Insufficient documentation

## 2019-07-12 NOTE — Therapy (Signed)
[place], [place], [address] Phone: [phone]   Fax:  [phone]  Physical Therapy Treatment  Patient Details  Name: Mackenzie [name] MRN: [id number] Date of Birth: 10-06-1970 Referring Provider (PT): Dillingham   Encounter Date: 07/12/2019  PT End of Session - 07/12/19 6606    Visit Number  22    Number of Visits  24    Date for PT Re-Evaluation  07/19/19    PT Start Time  1615   pt arrived late   PT Stop Time  1650    PT Time Calculation (min)  35 min    Activity Tolerance  Patient tolerated treatment well    Behavior During Therapy  [facility] for tasks assessed/performed       Past Medical History:  Diagnosis Date  . Abnormal glucose 2018  . 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 Allergic rhinitis 2012  . Anemia 01/27/2017   prior to starting chemotherapy  . Breast cancer ([place]) 01/14/2017   Left breast  . 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 Fatty liver   . Hot flashes 03/2017  . Hyperlipidemia 03/20/2016  . Morbid obesity with body mass index (BMI) of 40.0 to 49.9 ([place]) 09/17/2017  . NCGS (non-celiac gluten sensitivity)   . Pre-diabetes   . Seasonal allergies 2012   seasonal allergies causes allergic rhinitis and itchy, dry eyes per pt  . Vitamin D deficiency 05/2015    Past Surgical History:  Procedure Laterality Date  . BREAST RECONSTRUCTION WITH PLACEMENT OF TISSUE EXPANDER AND FLEX HD (ACELLULAR HYDRATED DERMIS) Left 08/27/2017   Procedure: LEFT BREAST RECONSTRUCTION WITH PLACEMENT OF TISSUE EXPANDER AND FLEX HD;  Surgeon: Wallace Going, DO;  Location: [facility];  Service: Plastics;  Laterality: Left;  . CESAREAN SECTION     x2  . LATISSIMUS FLAP TO BREAST Left 11/03/2018  . LATISSIMUS FLAP TO BREAST Left 11/03/2018   Procedure: LATISSIMUS FLAP TO LEFT BREAST;  Surgeon: Wallace Going, DO;  Location: [facility];  Service: Plastics;   Laterality: Left;  Marland Kitchen MASTECTOMY MODIFIED RADICAL Left 08/27/2017  . MASTECTOMY MODIFIED RADICAL Left 08/27/2017   Procedure: LEFT MODIFIED RADICAL MASTECTOMY;  Surgeon: Erroll Luna, MD;  Location: [facility];  Service: General;  Laterality: Left;  Marland Kitchen MASTOPEXY Right 03/03/2019   Procedure: RIGHT BREAST MASTOPEXY/REDUCTION;  Surgeon: Wallace Going, DO;  Location: [facility];  Service: Plastics;  Laterality: Right;  3 hours, please  . PORT-A-CATH REMOVAL Right 08/27/2017   Procedure: REMOVAL PORT-A-CATH RIGHT CHEST;  Surgeon: Erroll Luna, MD;  Location: [facility];  Service: General;  Laterality: Right;  . PORTACATH PLACEMENT Right 01/28/2017   Procedure: INSERTION PORT-A-CATH WITH ULTRASOUND;  Surgeon: Erroll Luna, MD;  Location: [facility];  Service: General;  Laterality: Right;  . REMOVAL OF TISSUE EXPANDER AND PLACEMENT OF IMPLANT Left 03/03/2019   Procedure: REMOVAL OF TISSUE EXPANDER AND PLACEMENT OF EXPANDER;  Surgeon: Wallace Going, DO;  Location: [facility];  Service: Plastics;  Laterality: Left;  . REMOVAL OF TISSUE EXPANDER AND PLACEMENT OF IMPLANT Left 05/25/2019   Procedure: LEFT BREAST REMOVAL OF TISSUE EXPANDER AND PLACEMENT OF IMPLANT;  Surgeon: Wallace Going, DO;  Location: [facility];  Service: Plastics;  Laterality: Left;  . TISSUE EXPANDER PLACEMENT Left 11/03/2018   Procedure: PLACEMENT OF TISSUE EXPANDER LEFT BREAST;  Surgeon: Wallace Going, DO;  [facility];  Service: Clinical cytogeneticist;  Laterality: Left;  Total case time is 3.5 hours  . TUBAL LIGATION Bilateral 01/21/2011    There were no vitals filed for this visit.  Subjective Assessment - 07/12/19 1617    Subjective  I had some muscle soreness after the exercises.    Pertinent History  Left breast cancer diagnosed 01/10/17 by mammogram and needle biopsy. Then had neo-adjuvant chemo starting 02/10/17 until 06/26/17; had left mastectomy 08/27/17  with ALND and immediate expander placed. Radiation completed. Expander removed and replaced on May 27, 19 b/c it did not get expanded enough prior to radiation, also had lat flap 11/02/17. Had expander removed and replaced again on 03/03/19 due to a small leak, pt had reduction on R breast at this time    Patient Stated Goals  to get strength back and ROM, be able to unhook bra easily    Currently in Pain?  No/denies    Pain Score  0-No pain                       [facility] Adult PT Treatment/Exercise - 07/12/19 0001      Manual Therapy   Passive ROM  to Lt shoulder in direction of abduction, flexion, and ER with prolonged holds while performing STM to L pec muscle to decrease tightness                  PT Long Term Goals - 07/12/19 1618      PT LONG TERM GOAL #1   Title  Pt will demonstrate 160 degrees of left shoulder flexion to allow her to reach overhead.    Baseline  137, 04/26/19- 141, 05/19/19- 150, 06/21/19- 143, 07/12/19- 170    Time  4    Period  Weeks    Status  Achieved      PT LONG TERM GOAL #2   Title  Pt will demonstrate 160 degrees of left shoulder abduction to allow her to reach out to the side    Baseline  135, 04/26/19- 160, 06/21/19- 135, 07/12/19- 162    Time  4    Period  Weeks    Status  Achieved      PT LONG TERM GOAL #3   Title  Pt will report an 80% improvement in pain and tightness in L axilla to allow pt to move LUE without discomfort.    Baseline  04/26/19- 60% improved, 05/19/19- 75% improvement, 06/21/19- 65% improvement, 07/12/19- 85% improvement    Time  4    Period  Weeks    Status  Achieved      PT LONG TERM GOAL #4   Title  Pt will be independent with a home exercise program for continued strengthening and stretching    Baseline  07/12/19- pt is independent with this    Time  4    Period  Weeks    Status  Achieved      PT LONG TERM GOAL #5   Title  Pt will report a 50% improvement in back pain in area of lat flap when walking to  allow improved comfort    Baseline  04/26/19- 0% improvement, 05/19/19- 80% improved    Time  4    Period  Weeks    Status  Achieved            Plan - 07/12/19 1659    Clinical Impression Statement  Assessed pt's progress towards goals in therapy. She has  now met all goals for therapy and is independent in her home exercise program and new exercises issued at last session. Pt will be discharged from skilled PT services at this time.    PT Frequency  2x / week    PT Duration  4 weeks    PT Treatment/Interventions  ADLs/Self Care Home Management;Therapeutic exercise;Patient/family education;Manual techniques;Manual lymph drainage;Compression bandaging;Scar mobilization;Passive range of motion;Therapeutic activities    PT Next Visit Plan  d/c this visit    PT Home Exercise Plan  Access Code: [email or web address], supine over foam roll, rockwood for LUE, wall washing, 3 way raises, corner stretch, post op stretches for after surgery of Dr. Sherrine Maples, Strength ABC program    Consulted and Agree with Plan of Care  Patient       Patient will benefit from skilled therapeutic intervention in order to improve the following deficits and impairments:  Decreased range of motion, Impaired UE functional use, Decreased strength, Other (comment), Pain, Postural dysfunction  Visit Diagnosis: Stiffness of left shoulder, not elsewhere classified  Acute pain of left shoulder     Problem List Patient Active Problem List   Diagnosis Date Noted  . Benign essential HTN 08/17/2018  . Morbid obesity with body mass index (BMI) of 40.0 to 49.9 ([place]) 09/17/2017  . 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 Malignant neoplasm of upper-inner quadrant of left breast in female, estrogen receptor positive ([place]) 07/27/2017  . Genetic testing 03/25/2017  . Malignant 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, estrogen receptor positive ([place]) 01/23/2017  . 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,  left ([place]) 01/23/2017    Mackenzie [name] [facility] 07/12/2019, 5:01 PM  [place], [place], [address] Phone: [phone]   Fax:  [phone]  Name: ALEIGHNA WOJTAS MRN: [id number] Date of Birth: [birth year]  PHYSICAL THERAPY DISCHARGE SUMMARY  Visits from Start of Care: 22  Current functional level related to goals / functional outcomes: All goals met   Remaining deficits: none   Education / Equipment: HEP  Plan: Patient agrees to discharge.  Patient goals were met. Patient is being discharged due to meeting the stated rehab goals.  ?????    Mackenzie [name] [place], [place] 07/12/19 5:03 PM

## 2019-07-14 ENCOUNTER — Encounter: Payer: 59 | Admitting: Physical Therapy

## 2019-07-21 ENCOUNTER — Telehealth: Payer: Self-pay | Admitting: *Deleted

## 2019-07-21 DIAGNOSIS — C50812 Malignant neoplasm of overlapping sites of left female breast: Secondary | ICD-10-CM

## 2019-07-21 NOTE — Telephone Encounter (Signed)
[name]- LAB for Cycle 16- LVM for patient reminding her to fast for at least 8 hrs prior to her lab appointment tomorrow morning.  Reminded patient to not take Letrozole until after her lab appointment.  Informed patient there are study questionnaires to complete at this time point and asked if patient can come in early to complete questionnaires before her lab appointment tomorrow.  Asked patient to call back if any problem with this appt or any questions.  Otherwise will plan to see her in the morning. Thanked patient for her time.  Foye Spurling, BSN, RN Clinical Research Nurse 07/21/2019 11:52 AM

## 2019-07-22 ENCOUNTER — Encounter: Payer: Self-pay | Admitting: *Deleted

## 2019-07-22 ENCOUNTER — Other Ambulatory Visit: Payer: Self-pay

## 2019-07-22 ENCOUNTER — Inpatient Hospital Stay: Payer: 59 | Attending: Adult Health

## 2019-07-22 DIAGNOSIS — C50812 Malignant neoplasm of overlapping sites of left female breast: Secondary | ICD-10-CM | POA: Insufficient documentation

## 2019-07-22 DIAGNOSIS — Z5111 Encounter for antineoplastic chemotherapy: Secondary | ICD-10-CM | POA: Insufficient documentation

## 2019-07-22 DIAGNOSIS — Z9012 Acquired absence of left breast and nipple: Secondary | ICD-10-CM | POA: Insufficient documentation

## 2019-07-22 DIAGNOSIS — Z79811 Long term (current) use of aromatase inhibitors: Secondary | ICD-10-CM | POA: Insufficient documentation

## 2019-07-22 DIAGNOSIS — Z923 Personal history of irradiation: Secondary | ICD-10-CM | POA: Insufficient documentation

## 2019-07-22 DIAGNOSIS — I890 Lymphedema, not elsewhere classified: Secondary | ICD-10-CM | POA: Insufficient documentation

## 2019-07-22 DIAGNOSIS — Z170 Estrogen receptor positive status [ER+]: Secondary | ICD-10-CM

## 2019-07-22 DIAGNOSIS — R202 Paresthesia of skin: Secondary | ICD-10-CM | POA: Insufficient documentation

## 2019-07-22 DIAGNOSIS — Z006 Encounter for examination for normal comparison and control in clinical research program: Secondary | ICD-10-CM | POA: Insufficient documentation

## 2019-07-22 DIAGNOSIS — Z803 Family history of malignant neoplasm of breast: Secondary | ICD-10-CM | POA: Insufficient documentation

## 2019-07-22 DIAGNOSIS — Z9221 Personal history of antineoplastic chemotherapy: Secondary | ICD-10-CM | POA: Insufficient documentation

## 2019-07-22 LAB — CMP (CANCER CENTER ONLY)
ALT: 25 U/L (ref 0–44)
AST: 20 U/L (ref 15–41)
Albumin: 3.7 g/dL (ref 3.5–5.0)
Alkaline Phosphatase: 126 U/L (ref 38–126)
Anion gap: 6 (ref 5–15)
BUN: 15 mg/dL (ref 6–20)
CO2: 27 mmol/L (ref 22–32)
Calcium: 8.8 mg/dL — ABNORMAL LOW (ref 8.9–10.3)
Chloride: 106 mmol/L (ref 98–111)
Creatinine: 0.85 mg/dL (ref 0.44–1.00)
GFR, Est AFR Am: >60 mL/min (ref >60)
GFR, Estimated: >60 mL/min (ref >60)
Glucose, Bld: 110 mg/dL — ABNORMAL HIGH (ref 70–99)
Potassium: 3.8 mmol/L (ref 3.5–5.1)
Sodium: 139 mmol/L (ref 135–145)
Total Bilirubin: 0.3 mg/dL (ref 0.3–1.2)
Total Protein: 7.4 g/dL (ref 6.5–8.1)

## 2019-07-22 LAB — CBC WITH DIFFERENTIAL (CANCER CENTER ONLY)
Abs Immature Granulocytes: 0.01 10*3/uL (ref 0.00–0.07)
Basophils Absolute: 0 10*3/uL (ref 0.0–0.1)
Basophils Relative: 1 %
Eosinophils Absolute: 0 10*3/uL (ref 0.0–0.5)
Eosinophils Relative: 1 %
HCT: 35.6 % — ABNORMAL LOW (ref 36.0–46.0)
Hemoglobin: 11.7 g/dL — ABNORMAL LOW (ref 12.0–15.0)
Immature Granulocytes: 0 %
Lymphocytes Relative: 33 %
Lymphs Abs: 1 10*3/uL (ref 0.7–4.0)
MCH: 28.7 pg (ref 26.0–34.0)
MCHC: 32.9 g/dL (ref 30.0–36.0)
MCV: 87.3 fL (ref 80.0–100.0)
Monocytes Absolute: 0.4 10*3/uL (ref 0.1–1.0)
Monocytes Relative: 13 %
Neutro Abs: 1.5 10*3/uL — ABNORMAL LOW (ref 1.7–7.7)
Neutrophils Relative %: 52 %
Platelet Count: 210 10*3/uL (ref 150–400)
RBC: 4.08 MIL/uL (ref 3.87–5.11)
RDW: 15 % (ref 11.5–15.5)
WBC Count: 3 10*3/uL — ABNORMAL LOW (ref 4.0–10.5)
nRBC: 0 % (ref 0.0–0.2)

## 2019-07-22 LAB — URIC ACID: Uric Acid, Serum: 3.7 mg/dL (ref 2.5–7.1)

## 2019-07-22 LAB — MAGNESIUM: Magnesium: 1.8 mg/dL (ref 1.7–2.4)

## 2019-07-22 LAB — LACTATE DEHYDROGENASE: LDH: 169 U/L (ref 98–192)

## 2019-07-22 LAB — BILIRUBIN, DIRECT: Bilirubin, Direct: 0.1 mg/dL (ref 0.0–0.2)

## 2019-07-22 LAB — LIPASE, BLOOD: Lipase: 19 U/L (ref 11–51)

## 2019-07-22 LAB — PHOSPHORUS: Phosphorus: 3.2 mg/dL (ref 2.5–4.6)

## 2019-07-22 LAB — AMYLASE: Amylase: 69 U/L (ref 28–100)

## 2019-07-22 LAB — GAMMA GT: GGT: 23 U/L (ref 7–50)

## 2019-07-22 NOTE — Research (Signed)
Assessment prior to C16D1 of [name]: Patient into clinic by herself this morning to complete some of the study activities prior to cycle 10.   PROs; Study questionnaires given to patient to complete prior to her lab appointment this morning.  Patient completed by herself while waiting for lab appointment. Research nurse collected and checked for completeness and accuracy.  Labs; Labs drawn per protocol.  Patient confirmed she has been fasting for at least 8 hours prior to this appointment. Informed patient research nurse will call her if any urgent results today. She has not taken Letrozole yet today and instructed patient she may take as soon as her labs are collected.  Plan; Patient confirmed her appointments for Monday 07/25/19 to start cycle 16. Reminded patient to bring in her Ribociclib bottles with leftover pills and her medication diaries on Monday. Remind patient to not take her Letrozole before her appointment as she will need to take in clinic that day along with the Ribociclib. She is also scheduled for to get Zoladex injection. Thanked patient for her time today and encouraged to call if any questions or concerns prior to Monday. She verbalized understanding. Foye Spurling, BSN, RN Clinical Research Nurse 07/22/2019 11:28 AM

## 2019-07-25 ENCOUNTER — Encounter: Payer: Self-pay | Admitting: Adult Health

## 2019-07-25 ENCOUNTER — Encounter: Payer: Self-pay | Admitting: *Deleted

## 2019-07-25 ENCOUNTER — Inpatient Hospital Stay (HOSPITAL_BASED_OUTPATIENT_CLINIC_OR_DEPARTMENT_OTHER): Payer: 59 | Admitting: Adult Health

## 2019-07-25 ENCOUNTER — Telehealth: Payer: Self-pay | Admitting: Adult Health

## 2019-07-25 ENCOUNTER — Encounter: Payer: Self-pay | Admitting: Oncology

## 2019-07-25 ENCOUNTER — Inpatient Hospital Stay: Payer: 59

## 2019-07-25 ENCOUNTER — Other Ambulatory Visit: Payer: Self-pay

## 2019-07-25 ENCOUNTER — Other Ambulatory Visit: Payer: 59

## 2019-07-25 VITALS — BP 135/82 | HR 80 | Temp 98.3°F | Resp 16 | Ht 66.0 in | Wt 270.8 lb

## 2019-07-25 VITALS — BP 134/77 | HR 75 | Temp 98.2°F | Resp 18

## 2019-07-25 DIAGNOSIS — C50912 Malignant neoplasm of unspecified site of left female breast: Secondary | ICD-10-CM

## 2019-07-25 DIAGNOSIS — Z803 Family history of malignant neoplasm of breast: Secondary | ICD-10-CM | POA: Diagnosis not present

## 2019-07-25 DIAGNOSIS — C50812 Malignant neoplasm of overlapping sites of left female breast: Secondary | ICD-10-CM

## 2019-07-25 DIAGNOSIS — I890 Lymphedema, not elsewhere classified: Secondary | ICD-10-CM | POA: Diagnosis not present

## 2019-07-25 DIAGNOSIS — Z79811 Long term (current) use of aromatase inhibitors: Secondary | ICD-10-CM

## 2019-07-25 DIAGNOSIS — C773 Secondary and unspecified malignant neoplasm of axilla and upper limb lymph nodes: Secondary | ICD-10-CM

## 2019-07-25 DIAGNOSIS — Z9221 Personal history of antineoplastic chemotherapy: Secondary | ICD-10-CM

## 2019-07-25 DIAGNOSIS — Z006 Encounter for examination for normal comparison and control in clinical research program: Secondary | ICD-10-CM | POA: Diagnosis not present

## 2019-07-25 DIAGNOSIS — R202 Paresthesia of skin: Secondary | ICD-10-CM

## 2019-07-25 DIAGNOSIS — Z170 Estrogen receptor positive status [ER+]: Secondary | ICD-10-CM

## 2019-07-25 DIAGNOSIS — C50212 Malignant neoplasm of upper-inner quadrant of left female breast: Secondary | ICD-10-CM

## 2019-07-25 DIAGNOSIS — Z923 Personal history of irradiation: Secondary | ICD-10-CM | POA: Diagnosis not present

## 2019-07-25 DIAGNOSIS — Z5111 Encounter for antineoplastic chemotherapy: Secondary | ICD-10-CM | POA: Diagnosis not present

## 2019-07-25 DIAGNOSIS — Z9012 Acquired absence of left breast and nipple: Secondary | ICD-10-CM

## 2019-07-25 MED ORDER — GOSERELIN ACETATE 3.6 MG ~~LOC~~ IMPL
DRUG_IMPLANT | SUBCUTANEOUS | Status: AC
  Filled 2019-07-25: qty 3.6

## 2019-07-25 MED ORDER — GOSERELIN ACETATE 3.6 MG ~~LOC~~ IMPL
3.6000 mg | DRUG_IMPLANT | Freq: Once | SUBCUTANEOUS | Status: AC
  Administered 2019-07-25: 3.6 mg via SUBCUTANEOUS

## 2019-07-25 NOTE — Telephone Encounter (Signed)
I left a message regarding schedule  

## 2019-07-25 NOTE — Progress Notes (Signed)
[facility]  [phone] [phone]    ID: Mackenzie [name] DOB: [birth year]  MR#: [id number]  TML#:[id number]  Patient Care Team: Willey Blade, MD as PCP - General (Internal Medicine) Magrinat, [name], MD as Consulting Physician (Oncology) Erroll Luna, MD as Consulting Physician (General Surgery) Kyung Rudd, MD as Consulting Physician (Radiation Oncology) Dillingham, Loel Lofty, DO as Attending Physician (Plastic Surgery) Cathlean Cower, RN as Registered Nurse OTHER MD:    CHIEF COMPLAINT: Estrogen receptor positive breast cancer  CURRENT TREATMENT: letrozole, goserelin; on [name] trial (ribociclib arm)   INTERVAL HISTORY: Mackenzie [name] returns today for follow-up and treatment of her estrogen receptor positive breast cancer.  Her study nurse was also present during the visit  Ashaunti is on study with [name]--Ribociclb and anti estrogen therapy with Letrozole daily.  She is tolerating these medications without any major side effects.  She is currently cycle 16 of study  She also continues on goserelin every 4 weeks.  49 on 03/16/2018, showed a T-score of -0.7, which is considered normal.    Since her last visit she underwent MRI of the liver on 07/06/2019 that showed no metastatic disease, hepatic cysts, and cholelithiasis.     REVIEW OF SYSTEMS: Mackenzie [name] is doing well today.  She notes some new tingling in her left arm and hand.  This started several months ago.  It is in her left arm that has lymphedema.  It is intermittent.  She completed PT a couple of weeks ago.  She is wearing her sleeve every other day.  She notes that she lost her accommodations to avoid COVID and so she is looking for another job.    She has some post surgical discomfort, but is otherwise well.  She has had a decreased activity level outside of work.  She notes she has been working more as well. Her right breast screening  mammogram was due in 03/2019, however she underwent breast reduction just prior, and so this needed to be delayed by 6 months.    Mackenzie [name] has no fever, chills, chest pain, palpitations, cough, shortness of breath, headaches, vision issues, bowel/bladder changes, nausea, vomiting, or any other concerns.  A detailed ROS was otherwise non contributory.    HISTORY OF CURRENT ILLNESS: From the original intake note:  The patient herself noted some changes in her left breast late July 2018, she says, and eventually brought this to medical attention so that on 01/09/2017 she underwent bilateral diagnostic mammography with tomography and left breast ultrasonography at the breast Center. This found the breast density to be category C. In the upper inner quadrant of the left breast there was an area of asymmetry and there were malignant type calcifications involving all 4 quadrants. On exam there is firmness and palpable thickening in the anterior left breast with skin dimpling. Ultrasonography found at the 9:30 o'clock radiant 3 cm from the nipple a 2.7 cm mass and in the left axilla for abnormal lymph nodes largest of which measured 2.7 cm.  Biopsy of the left breast 9:30 o'clock mass 80 01/26/2017 showed ([id number]) invasive ductal carcinoma, with extracellular mucin, grade 1 or 2. In the lower outer left breast is separated biopsy the same day showed ductal carcinoma in situ. One of the 4 lymph nodes involved was positive for metastatic carcinoma. Prognostic panel on the invasive disease showed it to be estrogen receptor 100% positive, progesterone receptor 20% positive, both with strong staining intensity, with an MIB-1  of 20%, and no HER-2 amplification with a signals ratio 1.38 and the number per cell 2.90.  On 01/22/2017 the patient underwent bilateral breast MRI. This showed no involvement of the right breast, but in the left breast there was a masslike and non-masslike enhancement involving all  quadrants, with skin swelling but no abnormal enhancement of the skin or nipple areolar complex or pectoralis muscle. The mass could not be clearly measured but spanned approximately 12 cm. There was bulky left axillary lymphadenopathy, with the largest lymph node measuring up to 3.4 cm.  The patient's subsequent history is as detailed below.   PAST MEDICAL HISTORY: Past Medical History:  Diagnosis Date  . Abnormal glucose 2018  . 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 Allergic rhinitis 2012  . Anemia 01/27/2017   prior to starting chemotherapy  . Breast cancer ([facility]) 01/14/2017   Left breast  . 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) 01/23/2017  . Fatty liver   . Hot flashes 03/2017  . Hyperlipidemia 03/20/2016  . Morbid obesity with body mass index (BMI) of 40.0 to 49.9 ([facility]) 09/17/2017  . NCGS (non-celiac gluten sensitivity)   . Pre-diabetes   . Seasonal allergies 2012   seasonal allergies causes allergic rhinitis and itchy, dry eyes per pt  . Vitamin D deficiency 05/2015    PAST SURGICAL HISTORY: Past Surgical History:  Procedure Laterality Date  . BREAST RECONSTRUCTION WITH PLACEMENT OF TISSUE EXPANDER AND FLEX HD (ACELLULAR HYDRATED DERMIS) Left 08/27/2017   Procedure: LEFT BREAST RECONSTRUCTION WITH PLACEMENT OF TISSUE EXPANDER AND FLEX HD;  Surgeon: Wallace Going, DO;  Location: [facility];  Service: Plastics;  Laterality: Left;  . CESAREAN SECTION     x2  . LATISSIMUS FLAP TO BREAST Left 11/03/2018  . LATISSIMUS FLAP TO BREAST Left 11/03/2018   Procedure: LATISSIMUS FLAP TO LEFT BREAST;  Surgeon: Wallace Going, DO;  Location: [facility];  Service: Plastics;  Laterality: Left;  Marland Kitchen MASTECTOMY MODIFIED RADICAL Left 08/27/2017  . MASTECTOMY MODIFIED RADICAL Left 08/27/2017   Procedure: LEFT MODIFIED RADICAL MASTECTOMY;  Surgeon: Erroll Luna, MD;  Location: [facility];  Service: General;  Laterality: Left;  Marland Kitchen MASTOPEXY Right 03/03/2019    Procedure: RIGHT BREAST MASTOPEXY/REDUCTION;  Surgeon: Wallace Going, DO;  Location: [facility];  Service: Plastics;  Laterality: Right;  3 hours, please  . PORT-A-CATH REMOVAL Right 08/27/2017   Procedure: REMOVAL PORT-A-CATH RIGHT CHEST;  Surgeon: Erroll Luna, MD;  Location: [facility];  Service: General;  Laterality: Right;  . PORTACATH PLACEMENT Right 01/28/2017   Procedure: INSERTION PORT-A-CATH WITH ULTRASOUND;  Surgeon: Erroll Luna, MD;  Location: [facility];  Service: General;  Laterality: Right;  . REMOVAL OF TISSUE EXPANDER AND PLACEMENT OF IMPLANT Left 03/03/2019   Procedure: REMOVAL OF TISSUE EXPANDER AND PLACEMENT OF EXPANDER;  Surgeon: Wallace Going, DO;  Location: [facility];  Service: Plastics;  Laterality: Left;  . REMOVAL OF TISSUE EXPANDER AND PLACEMENT OF IMPLANT Left 05/25/2019   Procedure: LEFT BREAST REMOVAL OF TISSUE EXPANDER AND PLACEMENT OF IMPLANT;  Surgeon: Wallace Going, DO;  Location: [facility];  Service: Plastics;  Laterality: Left;  . TISSUE EXPANDER PLACEMENT Left 11/03/2018   Procedure: PLACEMENT OF TISSUE EXPANDER LEFT BREAST;  Surgeon: Wallace Going, DO;  Location: [facility];  Service: Plastics;  Laterality: Left;  Total case time is 3.5 hours  . TUBAL LIGATION Bilateral 01/21/2011    FAMILY  HISTORY Family History  Problem Relation Age of Onset  . Breast cancer Paternal Grandmother 27       d.60s from breast cancer. Did not have treatment.  . Other Mother        C.14 from complications of surgery to remove brain tumor  . Hypertension Other   . Diabetes Other   . Stroke Other    The patient's father still alive at age 4. The patient's mother died with a brain tumor which the patient says was "benign". She was 56. The patient has one brother, no sisters. The only breast cancer in the family is a paternal grandmother who died from breast cancer at an unknown age. There is no  history of ovarian or prostate cancer in the family.   GYNECOLOGIC HISTORY:  Patient's last menstrual period was 02/10/2017. Menarche age 64 and first live birth age 81 she is [place] P4. The patient is still having regular periods as of August 2018   SOCIAL HISTORY: (Updated 05/31/2018) Ambriella works at [employer] on the fifth floor, [place]. She is separated from her husband (as of 05/2018), [name], who is a Building control surveyor.  1 daughter is married and lives in [place] and works as a Education administrator. Cait's second daughter, [name], lives in [place] and works in the police department. Daughters, Tennyson and [place] are 33 and 6, living at home. The patient has a grandson she looks after. She attends a [employer].     ADVANCED DIRECTIVES: Not in place   HEALTH MAINTENANCE: Social History   Tobacco Use  . Smoking status: Never Smoker  . Smokeless tobacco: Never Used  Substance Use Topics  . Alcohol use: Yes    Comment: social  . Drug use: No    Colonoscopy: Never  PAP: December 2017  Bone density: Never   Allergies  Allergen Reactions  . Dilaudid [Hydromorphone] Itching    Current Outpatient Medications  Medication Sig Dispense Refill  . acetaminophen (TYLENOL) 500 MG tablet Take 1,000 mg by mouth every 6 (six) hours as needed (for pain/headaches.).     Marland Kitchen B Complex Vitamins (VITAMIN B COMPLEX PO) Take 1 capsule by mouth 2 (two) times daily.     . budesonide-formoterol (SYMBICORT) 80-4.5 MCG/ACT inhaler Inhale 2 puffs into the lungs every morning.     . Calcium Carb-Cholecalciferol (CALCIUM 600+D3 PO) Take 1 tablet by mouth 2 (two) times daily.     . cetirizine (ZYRTEC) 10 MG tablet Take 10 mg by mouth daily.      . Cholecalciferol (VITAMIN D-3) 5000 units TABS Take 5,000 Units by mouth 2 (two) times daily.     Marland Kitchen gabapentin (NEURONTIN) 300 MG capsule Take 1 capsule (300 mg total) by mouth at bedtime. (Patient taking differently: Take 300 mg by mouth every other  day. ) 90 capsule 4  . hydroxypropyl methylcellulose / hypromellose (ISOPTO TEARS / GONIOVISC) 2.5 % ophthalmic solution Place 1 drop into both eyes daily as needed for dry eyes.     Marland Kitchen ibuprofen (ADVIL,MOTRIN) 200 MG tablet Take 800 mg by mouth every 8 (eight) hours as needed for mild pain (for pain.).     . Investigational ribociclib (KISQALI) 200 MG tablet [name] Take 2 tablets (400 mg total) by mouth daily. Take 2 tablets ('400mg'$  total) by mouth daily on days 1-21.  Repeat every 28 days. 225 tablet 0  . letrozole (FEMARA) 2.5 MG tablet Take 1 tablet (2.5 mg total) by mouth daily. 90 tablet 4  . loratadine (  CLARITIN) 10 MG tablet Take 1 tablet (10 mg total) by mouth daily. (Patient taking differently: Take 10 mg by mouth daily as needed for allergies. ) 90 tablet 2  . Multiple Vitamin (MULTIVITAMIN WITH MINERALS) TABS tablet Take 1 tablet by mouth daily.     . Omega-3 Fatty Acids (FISH OIL) 1000 MG CAPS Take 1,000 mg by mouth 2 (two) times daily.     . phenylephrine (SUDAFED PE) 10 MG TABS tablet Take 10 mg by mouth every 4 (four) hours as needed.     . Probiotic Product (PROBIOTIC DAILY PO) Take 1 capsule by mouth 2 (two) times daily.     . Turmeric 450 MG CAPS Take 450 mg by mouth 2 (two) times daily.    Marland Kitchen ZINC OXIDE PO Take 1 tablet by mouth 2 (two) times a day.     No current facility-administered medications for this visit.   Facility-Administered Medications Ordered in Other Visits  Medication Dose Route Frequency Provider Last Rate Last Admin  . sodium chloride flush (NS) 0.9 % injection 10 mL  10 mL Intravenous PRN Magrinat, [name], MD   10 mL at 03/10/17 1239    OBJECTIVE:  Vitals:   07/25/19 0953  BP: 135/82  Pulse: 80  Resp: 16  Temp: 98.3 F (36.8 C)  SpO2: 98%   Wt Readings from Last 3 Encounters:  07/25/19 270 lb 12.8 oz (122.8 kg)  06/15/19 271 lb (122.9 kg)  05/31/19 271 lb 1 oz (123 kg)   Body mass index is 43.71 kg/m.   ECOG FS: 1 GENERAL: Patient is  a well appearing female in no acute distress HEENT:  Sclerae anicteric.  Oropharynx clear and moist. No ulcerations or evidence of oropharyngeal candidiasis. Neck is supple.  NODES:  No cervical, supraclavicular, or axillary lymphadenopathy palpated.  BREAST EXAM:  Left breast s/p mastectomy and reconstruction, no sign of local recurrence, right breast s/p reduction, benign LUNGS:  Clear to auscultation bilaterally.  No wheezes or rhonchi. HEART:  Regular rate and rhythm. No murmur appreciated. ABDOMEN:  Soft, nontender.  Positive, normoactive bowel sounds. No organomegaly palpated. MSK:  No focal spinal tenderness to palpation. EXTREMITIES:  Left arm swelling with sleeve on. SKIN:  Clear with no obvious rashes or skin changes. No nail dyscrasia. NEURO:  Nonfocal. Well oriented.  Appropriate affect.     LAB RESULTS:  No visits with results within 3 Day(s) from this visit.  Latest known visit with results is:  Appointment on 07/22/2019  Component Date Value Ref Range Status  . GGT 07/22/2019 23  7 - 50 U/L Final   Performed at [facility], [place] [address], [place], [place] [address]  . Lipase 07/22/2019 19  11 - 51 U/L Final   Performed at [facility], [place] [address], [place], [place] [address]  . Amylase 07/22/2019 69  28 - 100 U/L Final   Performed at [facility], [place] [address], [place], [place] [address]  . Bilirubin, Direct 07/22/2019 0.1  0.0 - 0.2 mg/dL Final   Performed at [facility] [address], [place], [place] [address]  . Uric Acid, Serum 07/22/2019 3.7  2.5 - 7.1 mg/dL Final   Performed at [facility], [place] [address] [place], [place] [address]  . Phosphorus 07/22/2019 3.2  2.5 - 4.6 mg/dL Final   Performed at [facility] [address], [place], [place] [address]  . Magnesium 07/22/2019  1.8  1.7 - 2.4 mg/dL Final   Performed at [facility], [place] [address] [place], [place] [address]  . LDH 07/22/2019 169  98 - 192 U/L Final   Performed at [facility], [place] [address], [place], [place] [address]  . Sodium 07/22/2019 139  135 - 145 mmol/L Final  . Potassium 07/22/2019 3.8  3.5 - 5.1 mmol/L Final  . Chloride 07/22/2019 106  98 - 111 mmol/L Final  . CO2 07/22/2019 27  22 - 32 mmol/L Final  . Glucose, Bld 07/22/2019 110* 70 - 99 mg/dL Final  . BUN 07/22/2019 15  6 - 20 mg/dL Final  . Creatinine 07/22/2019 0.85  0.44 - 1.00 mg/dL Final  . Calcium 07/22/2019 8.8* 8.9 - 10.3 mg/dL Final  . Total Protein 07/22/2019 7.4  6.5 - 8.1 g/dL Final  . Albumin 07/22/2019 3.7  3.5 - 5.0 g/dL Final  . AST 07/22/2019 20  15 - 41 U/L Final  . ALT 07/22/2019 25  0 - 44 U/L Final  . Alkaline Phosphatase 07/22/2019 126  38 - 126 U/L Final  . Total Bilirubin 07/22/2019 0.3  0.3 - 1.2 mg/dL Final  . GFR, Est Non Af Am 07/22/2019 >60  >60 mL/min Final  . GFR, Est AFR Am 07/22/2019 >60  >60 mL/min Final  . Anion gap 07/22/2019 6  5 - 15 Final   Performed at [facility], [place] [address] [place], [place] [address]  . WBC Count 07/22/2019 3.0* 4.0 - 10.5 K/uL Final  . RBC 07/22/2019 4.08  3.87 - 5.11 MIL/uL Final  . Hemoglobin 07/22/2019 11.7* 12.0 - 15.0 g/dL Final  . HCT 07/22/2019 35.6* 36.0 - 46.0 % Final  . MCV 07/22/2019 87.3  80.0 - 100.0 fL Final  . MCH 07/22/2019 28.7  26.0 - 34.0 pg Final  . MCHC 07/22/2019 32.9  30.0 - 36.0 g/dL Final  . RDW 07/22/2019 15.0  11.5 - 15.5 % Final  . Platelet Count 07/22/2019 210  150 - 400 K/uL Final  . nRBC 07/22/2019 0.0  0.0 - 0.2 % Final  . Neutrophils Relative % 07/22/2019 52  % Final  . Neutro Abs 07/22/2019 1.5* 1.7 - 7.7 K/uL Final  . Lymphocytes Relative 07/22/2019 33  % Final  . Lymphs Abs 07/22/2019 1.0  0.7 - 4.0 K/uL Final  . Monocytes Relative 07/22/2019 13  % Final  . Monocytes Absolute 07/22/2019 0.4  0.1 - 1.0  K/uL Final  . Eosinophils Relative 07/22/2019 1  % Final  . Eosinophils Absolute 07/22/2019 0.0  0.0 - 0.5 K/uL Final  . Basophils Relative 07/22/2019 1  % Final  . Basophils Absolute 07/22/2019 0.0  0.0 - 0.1 K/uL Final  . Immature Granulocytes 07/22/2019 0  % Final  . Abs Immature Granulocytes 07/22/2019 0.01  0.00 - 0.07 K/uL Final   Performed at [facility], [place] [address] [place], [place] [address]     STUDIES: MR LIVER W WO CONTRAST  Result Date: 07/06/2019 CLINICAL DATA:  History of breast cancer progression suspected, abnormal ultrasound on October 11/27/2018 in EXAM: MRI ABDOMEN WITHOUT AND WITH CONTRAST TECHNIQUE: Multiplanar multisequence MR imaging of the abdomen was performed both before and after the administration of intravenous contrast. CONTRAST:  67m MULTIHANCE GADOBENATE DIMEGLUMINE 529 MG/ML IV SOLN COMPARISON:  Multiple prior studies most recent evaluations from 04/04/2019 FINDINGS: Lower chest: Incidental imaging of the lung bases while limited is unremarkable.  Hepatobiliary: Hepatic cysts. Liver contour is smooth. No signs of fat or iron deposition. Cholelithiasis with numerous gallstones in the gallbladder lumen. Pancreas: Normal appearance of the pancreas, no focal lesion or ductal dilation. Spleen:  Normal size spleen, no focal lesion. Adrenals/Urinary Tract: Normal adrenal glands. Smooth contour bilateral kidneys. No signs of hydronephrosis. Stomach/Bowel: Stool fills much of the colon. Limited assessment of gastrointestinal tract on MR, not protocoled for bowel evaluation. Appendix is normal. Vascular/Lymphatic: No pathologically enlarged lymph nodes identified. No abdominal aortic aneurysm demonstrated. Other:  None Musculoskeletal: Vertebral hemangioma at L4. Signs of left breast reconstruction partially imaged. Heterogeneity of the left axillary region only partially visible on the current study with infiltration of fat, presumably from left  axillary dissection. IMPRESSION: 1. No signs of metastatic disease to the abdomen. 2. Hepatic cysts. 3. Cholelithiasis. 4. Heterogeneity and interruption of normal fat signal in left axilla presumably from left axillary dissection. Correlation with physical exam and history may be helpful to determine whether further imaging of this area is warranted. Electronically Signed   By: Zetta Bills M.D.   On: 07/06/2019 21:27     ELIGIBLE FOR AVAILABLE RESEARCH PROTOCOL: [name], ASA study   ASSESSMENT: 49 y.o. [place] woman status post left breast upper inner quadrant biopsy 01/14/2017 for a clinical T3 N2, stage IIA invasive ductal carcinoma, grade 1 or 2, estrogen and progesterone receptor positive, HER-2 nonamplified, with an MIB-1 of 20%.  (1) staging studies: Brain MRI, bone scan, and CT scan of the chest 02/05/2017 showed no brain lesions, no lung or liver lesions, a 4.9 cm mass in the left breast with left axillary and subpectoral adenopathy, and nonspecific bone scan tracer at L2, left scapula, and anterior ribs, with lumbar spine MRI suggested for further evaluation.  (a) lumbar spine MRI 02/17/2017 showed no normal bone lesions.  There was mild lumbar spondylosis  (2) neoadjuvant chemotherapy consisting of cyclophosphamide and doxorubicin in dose dense fashion 4 started 02/10/2017, completed 03/24/2017, followed by weekly carboplatin and gemcitabine given days 1 and 8 of each 21-day cycle starting 04/14/2017, completing the planned 4 cycles 06/26/2017  (3) is post left modified radical mastectomy on 08/27/2017 showing an mpT3 pN2 residual invasive ductal carcinoma, grade 2, with a residual cancer burden of 3.  Margins were clear  (a) latissimus flap reconstruction with expander 11/03/2018  (4) postmastectomy radiation completed 01/01/2018  (a) capecitabine radiosensitization 11/16/2017-01/01/2018  (5) goserelin started 09/21/2017  (a) letrozole started 01/11/2018  (b) enrolled in  [place] clinical trial, randomized to ribociclib on 05/31/2018  (c) Bone density on 03/16/2018 was normal with T score of -0.7 in the L1-L4 spine   (6) genetics testing 03/24/2017 through the Common Hereditary Cancer Panel offered by [facility] found no deleterious mutations in APC, ATM, AXIN2, BARD1, BMPR1A, BRCA1, BRCA2, BRIP1, CDH1, CDKN2A (p14ARF), CDKN2A (p16INK4a), CHEK2, CTNNA1, DICER1, EPCAM (Deletion/duplication testing only), GREM1 (promoter region deletion/duplication testing only), KIT, MEN1, MLH1, MSH2, MSH3, MSH6, MUTYH, NBN, NF1, NHTL1, PALB2, PDGFRA, PMS2, POLD1, POLE, PTEN, RAD50, RAD51C, RAD51D, SDHB, SDHC, SDHD, SMAD4, SMARCA4. STK11, TP53, TSC1, TSC2, and VHL.  The following genes were evaluated for sequence changes only: SDHA and HOXB13 c.251G>A variant only.    (7) Right breast reduction and left breast expander placement on 03/03/2019  (a) Implant exchanged on 05/25/2019   PLAN: Shoni continues on treatment with Ribociclib, goserelin, and Letrozole with good tolerance.  She has no sign of breast cancer recurrence.  She is tolerating treatment well.  Her labs remain stable, and she  will continue her treatment as per the study.  We discussed healthy diet, exercise in detail today.    I think the tingling is related to her lymphedema.  I sent PT a message about this.    I placed orders for her right breast screening mammogram for 09/2019.  She will have this completed at the breast center.     Rosibel will now return every 4 weeks for Goserelin injections.  We will see her back on 10/17/2019 for her next follow up appointment with a lab prior.  She was recommended to continue with the appropriate pandemic precautions. She knows to call for any questions that may arise between now and her next appointment.  We are happy to see her sooner if needed.   Total encounter time: 30 minutes*   [name], NP  07/25/19 10:10 AM Medical Oncology and Hematology [facility] [place], [place] [address] Tel. [phone]    Fax. [phone]  *Total Encounter Time as defined by the Centers for Medicare and Medicaid Services includes, in addition to the face-to-face time of a patient visit (documented in the note above) non-face-to-face time: obtaining and reviewing outside history, ordering and reviewing medications, tests or procedures, care coordination (communications with other health care professionals or caregivers) and documentation in the medical record.

## 2019-07-25 NOTE — Research (Signed)
C16D1 [name]: Patient into clinic by herself this morning to complete the study activities prior to starting cycles 16, 17 and 18 on study.   PROs; Completed prior to lab on 07/22/19 .  Labs; Completed on 07/22/19.  Results reviewed by Dr. Jana Hakim and [name], NP. Results are all within study parameters to continue Ribociclib. Corrected Calcium Formula:  4 g/dL utilized for "normal albumin;" local normal range is 3.5-5 g/dL. Corrected Calcium = (0.8 x [normal albumin - patient's albumin]) + serum calcium Correct Calcium = (0.8 x [4-3.7]) + 8.8  Corrected Calcium = 9.04 mg/dl Vitals; Vital signs completed after patient rested more than five minutes. Weight without shoes. See VS flowsheet.  Concomitant Medications; Reviewed medication list with patient. She reports no new medications since last visit.  She has stopped taking hydrocodone and diazepam.   Physical Exam/PS - Please see documentation in today's office visit encounter with [name], NP. Adverse Events;  Reviewed with patient. She reports increased feeling of numbness and tingling in left arm for the past 2 months. States no numbness/ tingling on right arm in several months. See AE table below.  Investigational Medication - Completed medication diaries for Cycles 13, 14 and 15 were returned by patient.  Cycle 13 diary showed ribociclib was held for surgery this cycle. 2 doses of letrozole missed this cycle. Patient forgot once and the other time was missed due to surgery.    Cycle 14 diary showed 2 missed doses of ribociclib and 3 missed doses of letrozole. Patient states she forgot to take them on these days. Cycle 15 diary-showed 1 missed dose of ribociclib and 3 missed doses of letrozole. Patient states she forgot to take ribociclib and letrozole on these days.   Cycle 13 study medication bottle was returned unopened on a previous visit. Cycles 14 and 15  study medication bottles were returned to pharmacy today for  accounting by pharmacist Raul Del. Pill count revealed 39  tablets remaining in bottle for cycle 14 and 35 pills left in cycle 15 bottle.  This is the expected pill count for cycle 15 which corresponds to patient's diaries. Pill count for cycle 14 does not match patient's diary. There are 2 extra pills than expected for cycle 14.  Patient states her diary is accurate to the best of her recollection.  Investigational drug (x 3 bottles, to cover cycles 16 through 18), was dispensed by pharmacist Raul Del today continuing at the full dose of 400 mg daily. Patient was provided with 3 copies of the Patient Diary for recording medication doses (letrozole and ribociclib), during cycles 16 through 18.  Patient self-administered ribociclib and letrozole in clinic under the supervision of the research nurse at 11:00 am. Patient filled in diary dosing for today with time of 11:00 am. Patient understands that once a bottle of ribociclib is opened it should not be used after 30 days. Patient was instructed to open a new bottle with each cycle as marked, filling in the date opened, and to retain the unopened and partial bottles for return at her next clinic visit. She verbalized understanding. Plan/Scheduling - Patient is scheduled for goserelin injections every 4 weeks. Next study visit is due prior to cycle 19 on 10/17/19. Patient is aware that she should contact the clinic at any time ifanysymptomsworsen orrecur, or if she has any questions or concerns prior to next visit.  Thanked patient for her ongoing participation in this clnical trial.    AE Table: Event Grade  Onset Date End Date Status Comments Attribution to Ribociclib Attribution to Letrozole Attribution to Goserelin  Allergic Rhinitis 2 2012  ongoing At Baseline n/a n/a n/a  Constipation 1 08/27/17  ongoing At Baseline n/a n/a n/a  Cough, intermittent 2 05/09/18  ongoing At Baseline n/a n/a n/a  GERD, intermittent 1 2018  ongoing At  Baseline n/a n/a n/a  Gluten Sensitivity 1 12/2017  ongoing At Baseline n/a n/a n/a  Headaches 1 02/2017  ongoing At Baseline n/a n/a n/a  Hot Flashes 2 03/2017  ongoing At Baseline n/a n/a n/a  Hyperglycemia 1 2018  ongoing At Baseline n/a n/a n/a  Hyperlipidemia 1 03/2016  ongoing At Baseline n/a n/a n/a  Vitamin D Deficiency 2 05/2015  ongoing At Baseline n/a n/a n/a  Arthralgias bilat legs 1 10/01/18  ongoing At Baseline     Fatigue 1 06/28/18  ongoing   Yes No No  Blurred Vision 1 07/14/18  ongoing  No No No  Hypertension  1-3 2012  ongoing At Baseline. Intermittent grade 1 - 3 recorded since 2012. Changed to History at Baseline.      Left Shoulder stiffness, pain 2 03/25/19 07/11/18 decreased Grade decreased. Physical Therapy completed 07/11/18. No No No  Left Shoulder stiffness, pain 1 07/12/19  ongoing  No No No  Numbness bilat arms, intermittent 1 12/08/18 05/24/19 resolved on right arm Continues on left arm No No No  Numbness/tingling (Paresthesia) Left arm 1 05/24/19  ongoing  No No No  Hepatic Cysts 1 04/04/19  ongoing [name] 04/04/19 & MRI 07/06/19   No No No  Surgical pain, left breast/chest wall 1 05/29/19  ongoing  No No No  Cholelithiasis 1 04/04/19  ongoing [name] 04/04/19 & MRI 07/06/19 No No No  Anemia 1 07/22/19  ongoing  Yes No No  Decreased WBC 1 07/22/19  ongoing  Yes No No  Neutrophil Count decreased 1 07/22/19  ongoing  Yes No No    Foye Spurling, BSN, RN Clinical Research Nurse 07/25/2019 11:20 AM

## 2019-07-25 NOTE — Patient Instructions (Signed)

## 2019-07-26 ENCOUNTER — Other Ambulatory Visit: Payer: Self-pay | Admitting: Adult Health

## 2019-07-26 DIAGNOSIS — I890 Lymphedema, not elsewhere classified: Secondary | ICD-10-CM

## 2019-07-26 MED ORDER — INV-RIBOCICLIB 200 MG TAB NATALEE TRIO003 STUDY: 400.0000 mg | ORAL_TABLET | Freq: Every day | ORAL | 0 refills | Status: DC

## 2019-08-02 ENCOUNTER — Other Ambulatory Visit: Payer: Self-pay | Admitting: *Deleted

## 2019-08-03 DIAGNOSIS — M2241 Chondromalacia patellae, right knee: Secondary | ICD-10-CM | POA: Diagnosis not present

## 2019-08-03 DIAGNOSIS — M2242 Chondromalacia patellae, left knee: Secondary | ICD-10-CM | POA: Diagnosis not present

## 2019-08-09 ENCOUNTER — Ambulatory Visit (INDEPENDENT_AMBULATORY_CARE_PROVIDER_SITE_OTHER): Payer: 59 | Admitting: Family Medicine

## 2019-08-09 ENCOUNTER — Encounter: Payer: Self-pay | Admitting: Physical Therapy

## 2019-08-09 ENCOUNTER — Other Ambulatory Visit: Payer: Self-pay

## 2019-08-09 ENCOUNTER — Encounter (INDEPENDENT_AMBULATORY_CARE_PROVIDER_SITE_OTHER): Payer: Self-pay | Admitting: Family Medicine

## 2019-08-09 ENCOUNTER — Ambulatory Visit: Payer: 59 | Attending: Surgical | Admitting: Physical Therapy

## 2019-08-09 VITALS — BP 143/95 | HR 74 | Temp 97.6°F | Ht 65.0 in | Wt 267.0 lb

## 2019-08-09 DIAGNOSIS — R252 Cramp and spasm: Secondary | ICD-10-CM | POA: Diagnosis present

## 2019-08-09 DIAGNOSIS — M25661 Stiffness of right knee, not elsewhere classified: Secondary | ICD-10-CM | POA: Insufficient documentation

## 2019-08-09 DIAGNOSIS — Z1331 Encounter for screening for depression: Secondary | ICD-10-CM | POA: Diagnosis not present

## 2019-08-09 DIAGNOSIS — M25662 Stiffness of left knee, not elsewhere classified: Secondary | ICD-10-CM | POA: Diagnosis not present

## 2019-08-09 DIAGNOSIS — M25612 Stiffness of left shoulder, not elsewhere classified: Secondary | ICD-10-CM | POA: Diagnosis not present

## 2019-08-09 DIAGNOSIS — M25512 Pain in left shoulder: Secondary | ICD-10-CM | POA: Diagnosis present

## 2019-08-09 DIAGNOSIS — K760 Fatty (change of) liver, not elsewhere classified: Secondary | ICD-10-CM

## 2019-08-09 DIAGNOSIS — M6281 Muscle weakness (generalized): Secondary | ICD-10-CM | POA: Insufficient documentation

## 2019-08-09 DIAGNOSIS — R278 Other lack of coordination: Secondary | ICD-10-CM | POA: Insufficient documentation

## 2019-08-09 DIAGNOSIS — M25562 Pain in left knee: Secondary | ICD-10-CM | POA: Diagnosis not present

## 2019-08-09 DIAGNOSIS — R0602 Shortness of breath: Secondary | ICD-10-CM

## 2019-08-09 DIAGNOSIS — E559 Vitamin D deficiency, unspecified: Secondary | ICD-10-CM

## 2019-08-09 DIAGNOSIS — G8929 Other chronic pain: Secondary | ICD-10-CM | POA: Insufficient documentation

## 2019-08-09 DIAGNOSIS — Z483 Aftercare following surgery for neoplasm: Secondary | ICD-10-CM | POA: Diagnosis not present

## 2019-08-09 DIAGNOSIS — Z9189 Other specified personal risk factors, not elsewhere classified: Secondary | ICD-10-CM

## 2019-08-09 DIAGNOSIS — R5383 Other fatigue: Secondary | ICD-10-CM | POA: Diagnosis not present

## 2019-08-09 DIAGNOSIS — Z0289 Encounter for other administrative examinations: Secondary | ICD-10-CM

## 2019-08-09 DIAGNOSIS — R030 Elevated blood-pressure reading, without diagnosis of hypertension: Secondary | ICD-10-CM | POA: Diagnosis not present

## 2019-08-09 DIAGNOSIS — M25561 Pain in right knee: Secondary | ICD-10-CM | POA: Diagnosis not present

## 2019-08-09 DIAGNOSIS — R739 Hyperglycemia, unspecified: Secondary | ICD-10-CM

## 2019-08-09 DIAGNOSIS — Z6841 Body Mass Index (BMI) 40.0 and over, adult: Secondary | ICD-10-CM

## 2019-08-09 NOTE — Therapy (Signed)
[place], [place], [address] Phone: [phone]   Fax:  [phone]  Physical Therapy Re-Evaluation  Patient Details  Name: Mackenzie [name] MRN: [id number] Date of Birth: 05-Sep-1970 Referring Provider (PT): [name]    Encounter Date: 08/09/2019  PT End of Session - 08/09/19 1929    Visit Number  1    Number of Visits  17    Date for PT Re-Evaluation  10/10/19    PT Start Time  1310    PT Stop Time  1356    PT Time Calculation (min)  46 min    Activity Tolerance  Patient tolerated treatment well    Behavior During Therapy  [facility] for tasks assessed/performed       Past Medical History:  Diagnosis Date  . Abnormal glucose 2018  . 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 Allergic rhinitis 2012  . Anemia 01/27/2017   prior to starting chemotherapy  . Breast cancer ([place]) 01/14/2017   Left breast  . 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 Edema, lower extremity   . Fatty liver   . Hot flashes 03/2017  . Hyperlipidemia 03/20/2016  . Morbid obesity with body mass index (BMI) of 40.0 to 49.9 ([place]) 09/17/2017  . NCGS (non-celiac gluten sensitivity)   . Pre-diabetes   . Seasonal allergies 2012   seasonal allergies causes allergic rhinitis and itchy, dry eyes per pt  . Vitamin D deficiency 05/2015    Past Surgical History:  Procedure Laterality Date  . BREAST RECONSTRUCTION WITH PLACEMENT OF TISSUE EXPANDER AND FLEX HD (ACELLULAR HYDRATED DERMIS) Left 08/27/2017   Procedure: LEFT BREAST RECONSTRUCTION WITH PLACEMENT OF TISSUE EXPANDER AND FLEX HD;  Surgeon: Wallace Going, DO;  Location: [place];  Service: Plastics;  Laterality: Left;  . CESAREAN SECTION     x2  . LATISSIMUS FLAP TO BREAST Left 11/03/2018  . LATISSIMUS FLAP TO BREAST Left 11/03/2018   Procedure: LATISSIMUS FLAP TO LEFT BREAST;  Surgeon: Wallace Going, DO;  Location: [place];   Service: Plastics;  Laterality: Left;  Marland Kitchen MASTECTOMY MODIFIED RADICAL Left 08/27/2017  . MASTECTOMY MODIFIED RADICAL Left 08/27/2017   Procedure: LEFT MODIFIED RADICAL MASTECTOMY;  Surgeon: Erroll Luna, MD;  Location: [place];  Service: General;  Laterality: Left;  Marland Kitchen MASTOPEXY Right 03/03/2019   Procedure: RIGHT BREAST MASTOPEXY/REDUCTION;  Surgeon: Wallace Going, DO;  Location: [place];  Service: Plastics;  Laterality: Right;  3 hours, please  . PORT-A-CATH REMOVAL Right 08/27/2017   Procedure: REMOVAL PORT-A-CATH RIGHT CHEST;  Surgeon: Erroll Luna, MD;  Location: [place];  Service: General;  Laterality: Right;  . PORTACATH PLACEMENT Right 01/28/2017   Procedure: INSERTION PORT-A-CATH WITH ULTRASOUND;  Surgeon: Erroll Luna, MD;  Location: [place];  Service: General;  Laterality: Right;  . REMOVAL OF TISSUE EXPANDER AND PLACEMENT OF IMPLANT Left 03/03/2019   Procedure: REMOVAL OF TISSUE EXPANDER AND PLACEMENT OF EXPANDER;  Surgeon: Wallace Going, DO;  Location: [place];  Service: Plastics;  Laterality: Left;  . REMOVAL OF TISSUE EXPANDER AND PLACEMENT OF IMPLANT Left 05/25/2019   Procedure: LEFT BREAST REMOVAL OF TISSUE EXPANDER AND PLACEMENT OF IMPLANT;  Surgeon: Wallace Going, DO;  Location: [place];  Service: Plastics;  Laterality: Left;  . TISSUE EXPANDER PLACEMENT Left 11/03/2018   Procedure: PLACEMENT OF TISSUE EXPANDER LEFT BREAST;  Surgeon: Wallace Going,  DO;  Location: [place];  Service: Plastics;  Laterality: Left;  Total case time is 3.5 hours  . TUBAL LIGATION Bilateral 01/21/2011    There were no vitals filed for this visit.   Subjective Assessment - 08/09/19 1311    Subjective  Pt comes back to PT for treatment of tingling in her left arm . she has a sleeve and a glove and  been wearing her sleeve every other day . She thinks it may be too long and went to [facility] twice to  have it checked, but they said it was OK. She  has been noticing some swelling and numbness in her arm  She recently went to the ortho doctor for injections in her knees but the pain in them keeps her from walking.  She knows she needs to exercise and is interested in aquatic therapy    Pertinent History  Left breast cancer diagnosed 01/10/17 by mammogram and needle biopsy. Then had neo-adjuvant chemo starting 02/10/17 until 06/26/17; had left mastectomy 08/27/17 with ALND and immediate expander placed. Radiation completed. Expander removed and replaced on May 27, 19 b/c it did not get expanded enough prior to radiation, also had lat flap 11/02/17. Had expander removed and replaced again on 03/03/19 due to a small leak, pt had reduction on R breast at this time    Patient Stated Goals  to see if arm is ok, do what she needs to do to get back to exercising    Currently in Pain?  No/denies         [facility] PT Assessment - 08/09/19 0001      Assessment   Medical Diagnosis  left breast cancer with mastectomy and ALND    Referring Provider (PT)  [name]     Onset Date/Surgical Date  03/03/19    Hand Dominance  Right    Prior Therapy  PT in April 2019 for ROM, recently completed PT  07/12/19      Precautions   Precautions  Other (comment)    Precaution Comments  at risk for lymphedema      Restrictions   Weight Bearing Restrictions  No      [place] residence    Living Arrangements  Children    Available Help at Discharge  Family    Type of [place]      Prior Function   Level of Independence  Independent    Vocation  Part time employment    Vocation Requirements  is an Therapist, 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    Leisure  Has been walking 2-3 miles/day, 3-4 days/week      Cognition   Overall Cognitive Status  Within Functional Limits for tasks assessed      Observation/Other Assessments   Observations  scar healed and intact, tightness palpated in left axilla       Posture/Postural Control   Posture/Postural Control  Postural limitations    Postural Limitations  Rounded Shoulders;Forward head      ROM / Strength   AROM / PROM / Strength  AROM      AROM   Overall AROM Comments  tightness in left shoulder  stiffness in both knees     AROM Assessment Site  Shoulder    Right Shoulder Flexion  --    Right Shoulder ABduction  --    Right Shoulder Internal Rotation  --    Right Shoulder External Rotation  --  Left Shoulder Flexion  156 Degrees    Left Shoulder ABduction  170 Degrees    Left Shoulder Internal Rotation  --    Left Shoulder External Rotation  --      Strength   Overall Strength Comments  generalized deconditioining with pain and crepitus in knees with bending and walking       Flexibility   Soft Tissue Assessment /Muscle Length  --      Palpation   Palpation comment  tighness in left axilla with constriction marks from sleeve and discolaration in skin from radiation         LYMPHEDEMA/ONCOLOGY QUESTIONNAIRE - 08/09/19 1352      Right Upper Extremity Lymphedema   15 cm Proximal to Olecranon Process  42 cm    10 cm Proximal to Olecranon Process  41 cm    Olecranon Process  31 cm    15 cm Proximal to Ulnar Styloid Process  30.5 cm    10 cm Proximal to Ulnar Styloid Process  27 cm    Just Proximal to Ulnar Styloid Process  19.5 cm    Across Hand at [employer]  19.5 cm    At [place] of 2nd Digit  6.5 cm      Left Upper Extremity Lymphedema   Olecranon Process  31.5 cm    15 cm Proximal to Ulnar Styloid Process  30.5 cm    10 cm Proximal to Ulnar Styloid Process  27.5 cm    Just Proximal to Ulnar Styloid Process  18.5 cm    Across Hand at [employer]  20.7 cm    At [place] of 2nd Digit  6.5 cm    Other  [place] large long              Objective measurements completed on examination: See above findings.      [place] Adult PT Treatment/Exercise - 08/09/19 0001      Manual Therapy   Manual Therapy   Edema management;Manual Lymphatic Drainage (MLD)    Edema Management  Remeasured arms     Manual Lymphatic Drainage (MLD)  in supine, short neck, superficial and deep abdoimals. left inguinal nodes, left axillo-inguinal anastamoisis, right axillary nodes, anterior interaxillary anastamosis, left shoulder arm , eblow forearm and hand with return along pathways., then to sidelying for posterior interaxillary anastamosis.                   PT Long Term Goals - 08/09/19 1939      PT LONG TERM GOAL #1   Title  Pt will have well fiting prophylacitic compression garments for left arm    Time  8    Period  Weeks    Status  New      PT LONG TERM GOAL #2   Title  Pt will report the numbness in her left arm is decreased by 50%    Time  8    Period  Weeks    Status  New      PT LONG TERM GOAL #3   Title  Pt will report stiffness in both knees is decreased by 50%    Time  8    Period  Weeks    Status  New      PT LONG TERM GOAL #4   Title  Pt will be able to continue with an aquatic exercise program upon discontinuation of PT    Time  8  Period  Weeks    Status  New             Plan - 08/09/19 1930    Clinical Impression Statement  Pt comes to PT to assess if lymphedema is the cause of numbness in her arm.  Her sleeve appears to be too long and is rolled up in her axilla causing possible constriction. Both arms were measured and she does not appear to have lymphedema. She received manua lymph drainage today to see if it would have effect on her numbness.  Demographics sent to [facility] to see if she will have insurance covereage for replacement sleeves and will call [facility] to see if she can get a better fitting garment from them Pt needs to increase general condidtioning with exercise to decrease lymphedema risk but she is limited by pain in knees She would benefit from aquatics program so that is included in this plan of care. She will come to PT for MLD to see if it  will help numbness and get compression issues resolved.    Personal Factors and Comorbidities  Profession;Time since onset of injury/illness/exacerbation    Examination-Activity Limitations  Caring for Others;Reach Overhead;Carry;Dressing    Examination-Participation Restrictions  Community Activity;Cleaning    Stability/Clinical Decision Making  Stable/Uncomplicated    Clinical Decision Making  Low    Clinical Presentation due to:  numbness in left arm and pain in both knees limiting walking and exercise    Rehab Potential  Good    Clinical Impairments Affecting Rehab Potential  hx of radiation    PT Frequency  2x / week    PT Duration  8 weeks    PT Treatment/Interventions  ADLs/Self Care Home Management;Therapeutic exercise;Patient/family education;Manual techniques;Manual lymph drainage;Compression bandaging;Scar mobilization;Passive range of motion;Therapeutic activities;Aquatic Therapy;Orthotic Fit/Training    PT Next Visit Plan  manua lymph drainage and PROM to left shoulder with stretching to axilla. refer to Aquatics, check with [facility]    Consulted and Agree with Plan of Care  Patient       Patient will benefit from skilled therapeutic intervention in order to improve the following deficits and impairments:  Decreased range of motion, Impaired UE functional use, Decreased strength, Other (comment), Pain, Postural dysfunction  Visit Diagnosis: Stiffness of left shoulder, not elsewhere classified - Plan: PT plan of care cert/re-cert  Muscle weakness (generalized) - Plan: PT plan of care cert/re-cert  Aftercare following surgery for neoplasm - Plan: PT plan of care cert/re-cert  At risk for lymphedema - Plan: PT plan of care cert/re-cert  Stiffness of knee joint, right - Plan: PT plan of care cert/re-cert  Stiffness of knee joint, left - Plan: PT plan of care cert/re-cert     Problem List Patient Active Problem List   Diagnosis Date Noted  . Benign  essential HTN 08/17/2018  . Morbid obesity with body mass index (BMI) of 40.0 to 49.9 ([place]) 09/17/2017  . 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 Malignant neoplasm of upper-inner quadrant of left breast in female, estrogen receptor positive ([place]) 07/27/2017  . Genetic testing 03/25/2017  . Malignant 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, estrogen receptor positive ([place]) 01/23/2017  . 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 Owens Shark PT  Norwood Levo 08/09/2019, 7:46 PM  [place], [place], [address] Phone: [phone]   Fax:  [phone]  Name: Mackenzie [name] MRN: [id number] Date of Birth: 14-Apr-1971

## 2019-08-09 NOTE — Progress Notes (Signed)
Chief Complaint:   OBESITY Mackenzie [name] (MR# [id number]) is a 49 y.o. female who presents for evaluation and treatment of obesity and related comorbidities. Current BMI is Body mass index is 44.43 kg/m. Mackenzie [name] has been struggling with her weight for many years and has been unsuccessful in either losing weight, maintaining weight loss, or reaching her healthy weight goal.  Mackenzie [name] is currently in the action stage of change and ready to dedicate time achieving and maintaining a healthier weight. Mackenzie [name] is interested in becoming our patient and working on intensive lifestyle modifications including (but not limited to) diet and exercise for weight loss.  Mackenzie [name]'s habits were reviewed today and are as follows: Her family eats meals together, she thinks her family will eat healthier with her, her desired weight loss is 92 lbs, she started gaining weight after having children, her heaviest weight ever was 300 pounds, she has significant food cravings issues, she snacks frequently in the evenings, she frequently makes poor food choices, she frequently eats larger portions than normal and she struggles with emotional eating.  Depression Screen [name]'s Food and Mood (modified PHQ-9) score was 8.  Depression screen PHQ 2/9 08/09/2019  Decreased Interest 1  Down, Depressed, Hopeless 1  PHQ - 2 Score 2  Altered sleeping 1  Tired, decreased energy 2  Change in appetite 2  Feeling bad or failure about yourself  1  Trouble concentrating 0  Moving slowly or fidgety/restless 0  Suicidal thoughts 0  PHQ-9 Score 8  Difficult doing work/chores Somewhat difficult  Some recent data might be hidden   Subjective:   1. Other fatigue Mackenzie [name] admits to daytime somnolence and admits to waking up still tired. Patent has a history of symptoms of daytime fatigue. Mackenzie [name] generally gets 6 hours of sleep per night, and states that she has generally restful sleep. Snoring is present. Apneic episodes  are not present. Epworth Sleepiness Score is 9.  2. Shortness of breath on exertion Mackenzie [name] notes increasing shortness of breath with exercising and seems to be worsening over time with weight gain. She notes getting out of breath sooner with activity than she used to. This has not gotten worse recently. Mackenzie [name] denies shortness of breath at rest or orthopnea.  3. Hyperglycemia Sierria has a histroy of elevated glucose and A1c, and she notes polyphagia.  4. NAFLD (nonalcoholic fatty liver disease) Mackenzie [name] has fatty liver found on ultrasound approximately 6 months ago. She would like to improve with diet and weight loss.  5. Elevated blood-pressure reading, without history of hypertension [name] has some borderline elevated blood pressures. She denies a history of hypertension. She would like to improve with diet.  6. Vitamin D deficiency Mackenzie [name] is on OTC Vit D 5,000 IU daily.  7. At risk for hypertension The patient is at a higher than average risk of hypertension due to elevated blood pressure.  Assessment/Plan:   1. Other fatigue Mackenzie [name] does feel that her weight is causing her energy to be lower than it should be. Fatigue may be related to obesity, depression or many other causes. Labs will be ordered, and in the meanwhile, Mackenzie [name] will focus on self care including making healthy food choices, increasing physical activity and focusing on stress reduction.  - EKG 12-Lead - CBC with Differential/Platelet - Folate - Lipid Panel With LDL/HDL Ratio - Vitamin B12  2. Shortness of breath on exertion Mackenzie [name] does feel that she gets out of breath more easily that she used to when  she exercises. Mackenzie [name]'s shortness of breath appears to be obesity related and exercise induced. She has agreed to work on weight loss and gradually increase exercise to treat her exercise induced shortness of breath. Will continue to monitor closely.  3. Hyperglycemia Fasting labs will be obtained today and  results with be discussed with [name] in 2 weeks at her follow up visit. In the meanwhile Mackenzie [name] was started on a lower simple carbohydrate diet and will work on weight loss efforts.  - Hemoglobin A1c - Insulin, random  4. NAFLD (nonalcoholic fatty liver disease) We discussed the likely diagnosis of non-alcoholic fatty liver disease today and how this condition is obesity related. Mackenzie [name] was educated the importance of weight loss. We will check labs today. Mackenzie [name] agreed to continue with her weight loss efforts with healthier diet and exercise as an essential part of her treatment plan.  - T3 - T4, free - TSH  5. Elevated blood-pressure reading, without history of hypertension Mackenzie [name] will start her diet prescription. We will check labs today and will follow up.  - Comprehensive metabolic panel  6. Vitamin D deficiency Low Vitamin D level contributes to fatigue and are associated with obesity, breast, and colon cancer. We will check labs today. Mackenzie [name] will follow-up for routine testing of Vitamin D, at least 2-3 times per year to avoid over-replacement.  - VITAMIN D 25 Hydroxy (Vit-D Deficiency, Fractures)  7. Depression screening Mackenzie [name] had a positive depression screening. Depression is commonly associated with obesity and often results in emotional eating behaviors. We will monitor this closely and work on CBT to help improve the non-hunger eating patterns. Referral to Psychology may be required if no improvement is seen as she continues in our clinic.  8. At risk for hypertension Mackenzie [name] was given approximately 15 minutes of hypertension prevention counseling today. Mackenzie [name] is at risk for hypertension due to obesity. We discussed intensive lifestyle modifications today with an emphasis on weight loss as well as increasing exercise and decreasing salt intake.  Repetitive spaced learning was employed today to elicit superior memory formation and behavioral change.  9.  Class 3 severe obesity with serious comorbidity and body mass index (BMI) of 40.0 to 44.9 in adult, unspecified obesity type (HCC) Mackenzie [name] is currently in the action stage of change and her goal is to continue with weight loss efforts. I recommend Yalissa begin the structured treatment plan as follows:  She has agreed to the Category 3 Plan.  Exercise goals: Rashana is to continue her current exercise regimen as is.  Behavioral modification strategies: increasing lean protein intake, meal planning and cooking strategies and dealing with family or coworker sabotage.  She was informed of the importance of frequent follow-up visits to maximize her success with intensive lifestyle modifications for her multiple health conditions. She was informed we would discuss her lab results at her next visit unless there is a critical issue that needs to be addressed sooner. Shabnam agreed to keep her next visit at the agreed upon time to discuss these results.  Objective:   Blood pressure (!) 143/95, pulse 74, temperature 97.6 F (36.4 C), temperature source Oral, height 5\' 5"  (1.651 m), weight 267 lb (121.1 kg), last menstrual period 02/10/2017, SpO2 99 %, unknown if currently breastfeeding. Body mass index is 44.43 kg/m.  EKG: Normal sinus rhythm, rate 74 BPM.  Indirect Calorimeter completed today shows a VO2 of 228 and a REE of 1588.  Her calculated basal metabolic rate is 0000000 thus her basal  metabolic rate is worse than expected.  General: Cooperative, alert, well developed, in no acute distress. HEENT: Conjunctivae and lids unremarkable. Cardiovascular: Regular rhythm.  Lungs: Normal work of breathing. Neurologic: No focal deficits.   Lab Results  Component Value Date   CREATININE 0.85 07/22/2019   BUN 15 07/22/2019   NA 139 07/22/2019   K 3.8 07/22/2019   CL 106 07/22/2019   CO2 27 07/22/2019   Lab Results  Component Value Date   ALT 25 07/22/2019   AST 20 07/22/2019   ALKPHOS 126  07/22/2019   BILITOT 0.3 07/22/2019   Lab Results  Component Value Date   HGBA1C 5.9 (H) 07/26/2018   No results found for: INSULIN No results found for: TSH No results found for: CHOL, HDL, LDLCALC, LDLDIRECT, TRIG, CHOLHDL Lab Results  Component Value Date   WBC 3.0 (L) 07/22/2019   HGB 11.7 (L) 07/22/2019   HCT 35.6 (L) 07/22/2019   MCV 87.3 07/22/2019   PLT 210 07/22/2019   No results found for: IRON, TIBC, FERRITIN  Attestation Statements:   Reviewed by clinician on day of visit: allergies, medications, problem list, medical history, surgical history, family history, social history, and previous encounter notes.   I, Trixie Dredge, am acting as transcriptionist for Dennard Nip, MD.  I have reviewed the above documentation for accuracy and completeness, and I agree with the above. - Dennard Nip, MD

## 2019-08-10 LAB — COMPREHENSIVE METABOLIC PANEL
ALT: 20 IU/L (ref 0–32)
AST: 19 IU/L (ref 0–40)
Albumin/Globulin Ratio: 1.4 (ref 1.2–2.2)
Albumin: 4.4 g/dL (ref 3.8–4.8)
Alkaline Phosphatase: 157 IU/L — ABNORMAL HIGH (ref 39–117)
BUN/Creatinine Ratio: 15 (ref 9–23)
BUN: 13 mg/dL (ref 6–24)
Bilirubin Total: 0.4 mg/dL (ref 0.0–1.2)
CO2: 25 mmol/L (ref 20–29)
Calcium: 9.2 mg/dL (ref 8.7–10.2)
Chloride: 100 mmol/L (ref 96–106)
Creatinine, Ser: 0.88 mg/dL (ref 0.57–1.00)
GFR calc Af Amer: 89 mL/min/{1.73_m2} (ref >59)
GFR calc non Af Amer: 77 mL/min/{1.73_m2} (ref >59)
Globulin, Total: 3.2 g/dL (ref 1.5–4.5)
Glucose: 90 mg/dL (ref 65–99)
Potassium: 4.3 mmol/L (ref 3.5–5.2)
Sodium: 136 mmol/L (ref 134–144)
Total Protein: 7.6 g/dL (ref 6.0–8.5)

## 2019-08-10 LAB — CBC WITH DIFFERENTIAL/PLATELET
Basophils Absolute: 0 10*3/uL (ref 0.0–0.2)
Basos: 1 %
EOS (ABSOLUTE): 0.1 10*3/uL (ref 0.0–0.4)
Eos: 1 %
Hematocrit: 38.1 % (ref 34.0–46.6)
Hemoglobin: 12.7 g/dL (ref 11.1–15.9)
Immature Grans (Abs): 0 10*3/uL (ref 0.0–0.1)
Immature Granulocytes: 0 %
Lymphocytes Absolute: 1.6 10*3/uL (ref 0.7–3.1)
Lymphs: 26 %
MCH: 28.9 pg (ref 26.6–33.0)
MCHC: 33.3 g/dL (ref 31.5–35.7)
MCV: 87 fL (ref 79–97)
Monocytes Absolute: 0.6 10*3/uL (ref 0.1–0.9)
Monocytes: 10 %
Neutrophils Absolute: 3.7 10*3/uL (ref 1.4–7.0)
Neutrophils: 62 %
Platelets: 327 10*3/uL (ref 150–450)
RBC: 4.4 x10E6/uL (ref 3.77–5.28)
RDW: 14.3 % (ref 11.7–15.4)
WBC: 6 10*3/uL (ref 3.4–10.8)

## 2019-08-10 LAB — INSULIN, RANDOM: INSULIN: 12.6 u[IU]/mL (ref 2.6–24.9)

## 2019-08-10 LAB — TSH: TSH: 0.657 u[IU]/mL (ref 0.450–4.500)

## 2019-08-10 LAB — T4, FREE: Free T4: 1.37 ng/dL (ref 0.82–1.77)

## 2019-08-10 LAB — LIPID PANEL WITH LDL/HDL RATIO
Cholesterol, Total: 191 mg/dL (ref 100–199)
HDL: 68 mg/dL
LDL Chol Calc (NIH): 115 mg/dL — ABNORMAL HIGH (ref 0–99)
LDL/HDL Ratio: 1.7 ratio (ref 0.0–3.2)
Triglycerides: 42 mg/dL (ref 0–149)
VLDL Cholesterol Cal: 8 mg/dL (ref 5–40)

## 2019-08-10 LAB — FOLATE: Folate: >20 ng/mL (ref >3.0)

## 2019-08-10 LAB — HEMOGLOBIN A1C
Est. average glucose Bld gHb Est-mCnc: 123 mg/dL
Hgb A1c MFr Bld: 5.9 % — ABNORMAL HIGH (ref 4.8–5.6)

## 2019-08-10 LAB — T3: T3, Total: 89 ng/dL (ref 71–180)

## 2019-08-10 LAB — VITAMIN B12: Vitamin B-12: 1089 pg/mL (ref 232–1245)

## 2019-08-10 LAB — VITAMIN D 25 HYDROXY (VIT D DEFICIENCY, FRACTURES): Vit D, 25-Hydroxy: 92.5 ng/mL (ref 30.0–100.0)

## 2019-08-11 ENCOUNTER — Telehealth: Payer: Self-pay | Admitting: Physical Therapy

## 2019-08-11 ENCOUNTER — Other Ambulatory Visit: Payer: Self-pay

## 2019-08-11 ENCOUNTER — Ambulatory Visit: Payer: 59

## 2019-08-11 DIAGNOSIS — Z483 Aftercare following surgery for neoplasm: Secondary | ICD-10-CM

## 2019-08-11 DIAGNOSIS — M25662 Stiffness of left knee, not elsewhere classified: Secondary | ICD-10-CM | POA: Diagnosis not present

## 2019-08-11 DIAGNOSIS — M25661 Stiffness of right knee, not elsewhere classified: Secondary | ICD-10-CM | POA: Diagnosis not present

## 2019-08-11 DIAGNOSIS — M25612 Stiffness of left shoulder, not elsewhere classified: Secondary | ICD-10-CM | POA: Diagnosis not present

## 2019-08-11 DIAGNOSIS — G8929 Other chronic pain: Secondary | ICD-10-CM | POA: Diagnosis not present

## 2019-08-11 DIAGNOSIS — M25562 Pain in left knee: Secondary | ICD-10-CM | POA: Diagnosis not present

## 2019-08-11 DIAGNOSIS — M6281 Muscle weakness (generalized): Secondary | ICD-10-CM

## 2019-08-11 DIAGNOSIS — M25561 Pain in right knee: Secondary | ICD-10-CM | POA: Diagnosis not present

## 2019-08-11 DIAGNOSIS — Z9189 Other specified personal risk factors, not elsewhere classified: Secondary | ICD-10-CM | POA: Diagnosis not present

## 2019-08-11 NOTE — Telephone Encounter (Signed)
Called [facility] on Shalondra's behalf to see if anything could be done about her sleeve that is too long.  I could not speak to anyone but left a message for them to call Cyndra to follow up about the fit of her garment.  Maudry Diego, PT 08/11/2019@ 2:42 PM

## 2019-08-11 NOTE — Therapy (Signed)
[place], [place], [address] Phone: [phone]   Fax:  [phone]  Physical Therapy Treatment  Patient Details  Name: Mackenzie [name] MRN: [id number] Date of Birth: 07-06-1970 Referring Provider (PT): [name]    Encounter Date: 08/11/2019  PT End of Session - 08/11/19 1051    Visit Number  2    Number of Visits  17    Date for PT Re-Evaluation  10/10/19    PT Start Time  0806   pt arrived late   PT Stop Time  0902    PT Time Calculation (min)  56 min    Activity Tolerance  Patient tolerated treatment well    Behavior During Therapy  [facility] for tasks assessed/performed       Past Medical History:  Diagnosis Date  . Abnormal glucose 2018  . 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 Allergic rhinitis 2012  . Anemia 01/27/2017   prior to starting chemotherapy  . Breast cancer ([place]) 01/14/2017   Left breast  . 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 Edema, lower extremity   . Fatty liver   . Hot flashes 03/2017  . Hyperlipidemia 03/20/2016  . Morbid obesity with body mass index (BMI) of 40.0 to 49.9 ([place]) 09/17/2017  . NCGS (non-celiac gluten sensitivity)   . Pre-diabetes   . Seasonal allergies 2012   seasonal allergies causes allergic rhinitis and itchy, dry eyes per pt  . Vitamin D deficiency 05/2015    Past Surgical History:  Procedure Laterality Date  . BREAST RECONSTRUCTION WITH PLACEMENT OF TISSUE EXPANDER AND FLEX HD (ACELLULAR HYDRATED DERMIS) Left 08/27/2017   Procedure: LEFT BREAST RECONSTRUCTION WITH PLACEMENT OF TISSUE EXPANDER AND FLEX HD;  Surgeon: [place] Going, DO;  Location: [place];  Service: Plastics;  Laterality: Left;  . CESAREAN SECTION     x2  . LATISSIMUS FLAP TO BREAST Left 11/03/2018  . LATISSIMUS FLAP TO BREAST Left 11/03/2018   Procedure: LATISSIMUS FLAP TO LEFT BREAST;  Surgeon: [place] Going, DO;   Location: [place];  Service: Plastics;  Laterality: Left;  Marland Kitchen MASTECTOMY MODIFIED RADICAL Left 08/27/2017  . MASTECTOMY MODIFIED RADICAL Left 08/27/2017   Procedure: LEFT MODIFIED RADICAL MASTECTOMY;  Surgeon: Erroll Luna, MD;  Location: [place];  Service: General;  Laterality: Left;  Marland Kitchen MASTOPEXY Right 03/03/2019   Procedure: RIGHT BREAST MASTOPEXY/REDUCTION;  Surgeon: [place] Going, DO;  Location: [place];  Service: Plastics;  Laterality: Right;  3 hours, please  . PORT-A-CATH REMOVAL Right 08/27/2017   Procedure: REMOVAL PORT-A-CATH RIGHT CHEST;  Surgeon: Erroll Luna, MD;  Location: [place];  Service: General;  Laterality: Right;  . PORTACATH PLACEMENT Right 01/28/2017   Procedure: INSERTION PORT-A-CATH WITH ULTRASOUND;  Surgeon: Erroll Luna, MD;  Location: [place];  Service: General;  Laterality: Right;  . REMOVAL OF TISSUE EXPANDER AND PLACEMENT OF IMPLANT Left 03/03/2019   Procedure: REMOVAL OF TISSUE EXPANDER AND PLACEMENT OF EXPANDER;  Surgeon: [place] Going, DO;  Location: [place];  Service: Plastics;  Laterality: Left;  . REMOVAL OF TISSUE EXPANDER AND PLACEMENT OF IMPLANT Left 05/25/2019   Procedure: LEFT BREAST REMOVAL OF TISSUE EXPANDER AND PLACEMENT OF IMPLANT;  Surgeon: [place] Going, DO;  Location: [place];  Service: Plastics;  Laterality: Left;  . TISSUE EXPANDER PLACEMENT Left 11/03/2018   Procedure: PLACEMENT OF TISSUE EXPANDER LEFT BREAST;  Surgeon: [place] Going, DO;  Location: [place];  Service: Plastics;  Laterality: Left;  Total case time is 3.5 hours  . TUBAL LIGATION Bilateral 01/21/2011    There were no vitals filed for this visit.  Subjective Assessment - 08/11/19 0810    Subjective  Nothing new since I was here for eval 2 days.    Pertinent History  Left breast cancer diagnosed 01/10/17 by mammogram and needle biopsy. Then had neo-adjuvant chemo starting 02/10/17 until  06/26/17; had left mastectomy 08/27/17 with ALND and immediate expander placed. Radiation completed. Expander removed and replaced on May 27, 19 b/c it did not get expanded enough prior to radiation, also had lat flap 11/02/17. Had expander removed and replaced again on 03/03/19 due to a small leak, pt had reduction on R breast at this time    Patient Stated Goals  to see if arm is ok, do what she needs to do to get back to exercising    Currently in Pain?  No/denies                       [facility] Adult PT Treatment/Exercise - 08/11/19 0001      Manual Therapy   Manual Therapy  Manual Lymphatic Drainage (MLD);Passive ROM;Neural Stretch    Soft tissue mobilization  Briefly to Lt pect insertion during P/ROM as this is still very tight palpably and per pt report    Manual Lymphatic Drainage (MLD)  in supine, short neck, superficial and deep abdominals. left inguinal nodes, left axillo-inguinal anastomosis, right axillary nodes, anterior interaxillary anastomosis, left shoulder arm , elbow forearm and hand with return along pathways.    Passive ROM  to Lt shoulder in direction of abduction, flexion, and D2 with prolonged holds    Neural Stretch  To Lt UE during P/ROM                  PT Long Term Goals - 08/09/19 1939      PT LONG TERM GOAL #1   Title  Pt will have well fiting prophylacitic compression garments for left arm    Time  8    Period  Weeks    Status  New      PT LONG TERM GOAL #2   Title  Pt will report the numbness in her left arm is decreased by 50%    Time  8    Period  Weeks    Status  New      PT LONG TERM GOAL #3   Title  Pt will report stiffness in both knees is decreased by 50%    Time  8    Period  Weeks    Status  New      PT LONG TERM GOAL #4   Title  Pt will be able to continue with an aquatic exercise program upon discontinuation of PT    Time  8    Period  Weeks    Status  New            Plan - 08/11/19 1052    Clinical  Impression Statement  Resumed with manaul therapy today for Lt UE symptoms. Performed manual lymph drainage of Lt UE and included P/ROM with neural tension stretch. Pt tolerated all well and reports no increase of tingling symptoms during stretching.    Personal Factors and Comorbidities  Profession;Time since onset of injury/illness/exacerbation    Examination-Activity Limitations  Caring for Others;Reach Overhead;Carry;Dressing  Examination-Participation Restrictions  Community Activity;Cleaning    Stability/Clinical Decision Making  Stable/Uncomplicated    Rehab Potential  Good    Clinical Impairments Affecting Rehab Potential  hx of radiation    PT Frequency  2x / week    PT Duration  8 weeks    PT Treatment/Interventions  ADLs/Self Care Home Management;Therapeutic exercise;Patient/family education;Manual techniques;Manual lymph drainage;Compression bandaging;Scar mobilization;Passive range of motion;Therapeutic activities;Aquatic Therapy;Orthotic Fit/Training    PT Next Visit Plan  manual lymph drainage to Lt UE and PROM to left shoulder with stretching to axilla. refer to Aquatics, check with [facility]    Consulted and Agree with Plan of Care  Patient       Patient will benefit from skilled therapeutic intervention in order to improve the following deficits and impairments:  Decreased range of motion, Impaired UE functional use, Decreased strength, Other (comment), Pain, Postural dysfunction  Visit Diagnosis: Stiffness of left shoulder, not elsewhere classified  Muscle weakness (generalized)  Aftercare following surgery for neoplasm     Problem List Patient Active Problem List   Diagnosis Date Noted  . Benign essential HTN 08/17/2018  . Morbid obesity with body mass index (BMI) of 40.0 to 49.9 ([place]) 09/17/2017  . 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 Malignant neoplasm of upper-inner quadrant of left breast in female, estrogen receptor positive ([place])  07/27/2017  . Genetic testing 03/25/2017  . Malignant 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, estrogen receptor positive ([place]) 01/23/2017  . 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

## 2019-08-15 ENCOUNTER — Encounter (INDEPENDENT_AMBULATORY_CARE_PROVIDER_SITE_OTHER): Payer: Self-pay | Admitting: Family Medicine

## 2019-08-16 NOTE — Telephone Encounter (Signed)
Please advise 

## 2019-08-17 ENCOUNTER — Ambulatory Visit: Payer: 59 | Admitting: Physical Therapy

## 2019-08-17 ENCOUNTER — Other Ambulatory Visit: Payer: Self-pay

## 2019-08-17 DIAGNOSIS — M25561 Pain in right knee: Secondary | ICD-10-CM | POA: Diagnosis not present

## 2019-08-17 DIAGNOSIS — Z483 Aftercare following surgery for neoplasm: Secondary | ICD-10-CM

## 2019-08-17 DIAGNOSIS — M6281 Muscle weakness (generalized): Secondary | ICD-10-CM | POA: Diagnosis not present

## 2019-08-17 DIAGNOSIS — M25661 Stiffness of right knee, not elsewhere classified: Secondary | ICD-10-CM

## 2019-08-17 DIAGNOSIS — M25662 Stiffness of left knee, not elsewhere classified: Secondary | ICD-10-CM | POA: Diagnosis not present

## 2019-08-17 DIAGNOSIS — M25562 Pain in left knee: Secondary | ICD-10-CM | POA: Diagnosis not present

## 2019-08-17 DIAGNOSIS — M25612 Stiffness of left shoulder, not elsewhere classified: Secondary | ICD-10-CM

## 2019-08-17 DIAGNOSIS — G8929 Other chronic pain: Secondary | ICD-10-CM | POA: Diagnosis not present

## 2019-08-17 DIAGNOSIS — M25512 Pain in left shoulder: Secondary | ICD-10-CM

## 2019-08-17 DIAGNOSIS — Z9189 Other specified personal risk factors, not elsewhere classified: Secondary | ICD-10-CM

## 2019-08-17 MED FILL — GABAPENTIN 300 MG CAPSULE: 300 | 90 days supply | Qty: 90 | Fill #1

## 2019-08-17 MED FILL — LETROZOLE 2.5 MG TABS: 2.5 | 90 days supply | Qty: 90 | Fill #2

## 2019-08-17 NOTE — Patient Instructions (Signed)
Access Code: [email or web address] URL: [email or web address] Date: 08/17/2019 Prepared by: Maudry Diego  Exercises Standing Single Arm Shoulder Flexion with Posterior Anchored Resistance - 10 reps - 3 sets - 1x daily - 7x weekly Standing Shoulder Internal Rotation with Anchored Resistance - 10 reps - 3 sets - 1x daily - 7x weekly Shoulder External Rotation with Anchored Resistance - 10 reps - 3 sets - 1x daily - 7x weekly Single Arm Shoulder Extension with Anchored Resistance - 10 reps - 3 sets - 1x daily - 7x weekly Supine Bridge - 10 reps - 3 sets - 1x daily - 7x weekly Clamshell - 10 reps - 3 sets - 1x daily - 7x weekly Supine Active Straight Leg Raise - 10 reps - 3 sets - 1x daily - 7x weekly Sidelying Hip Abduction - 10 reps - 3 sets - 1x daily - 7x weekly Supine Active Straight Leg Raise - 10 reps - 3 sets - 1x daily - 7x weekly Supine Lower Trunk Rotation - 10 reps - 3 sets - 1x daily - 7x weekly

## 2019-08-17 NOTE — Therapy (Signed)
[place], [place], [address] Phone: [phone]   Fax:  [phone]  Physical Therapy Treatment  Patient Details  Name: Mackenzie [name] MRN: [id number] Date of Birth: 30-May-1971 Referring Provider (PT): [name]    Encounter Date: 08/17/2019  PT End of Session - 08/17/19 1617    Visit Number  3    Number of Visits  17    Date for PT Re-Evaluation  10/10/19    PT Start Time  1500    PT Stop Time  1545    PT Time Calculation (min)  45 min    Activity Tolerance  Patient tolerated treatment well    Behavior During Therapy  [facility] for tasks assessed/performed       Past Medical History:  Diagnosis Date  . Abnormal glucose 2018  . 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 Allergic rhinitis 2012  . Anemia 01/27/2017   prior to starting chemotherapy  . Breast cancer ([place]) 01/14/2017   Left breast  . 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 Edema, lower extremity   . Fatty liver   . Hot flashes 03/2017  . Hyperlipidemia 03/20/2016  . Morbid obesity with body mass index (BMI) of 40.0 to 49.9 ([place]) 09/17/2017  . NCGS (non-celiac gluten sensitivity)   . Pre-diabetes   . Seasonal allergies 2012   seasonal allergies causes allergic rhinitis and itchy, dry eyes per pt  . Vitamin D deficiency 05/2015    Past Surgical History:  Procedure Laterality Date  . BREAST RECONSTRUCTION WITH PLACEMENT OF TISSUE EXPANDER AND FLEX HD (ACELLULAR HYDRATED DERMIS) Left 08/27/2017   Procedure: LEFT BREAST RECONSTRUCTION WITH PLACEMENT OF TISSUE EXPANDER AND FLEX HD;  Surgeon: Wallace Going, DO;  Location: [place];  Service: Plastics;  Laterality: Left;  . CESAREAN SECTION     x2  . LATISSIMUS FLAP TO BREAST Left 11/03/2018  . LATISSIMUS FLAP TO BREAST Left 11/03/2018   Procedure: LATISSIMUS FLAP TO LEFT BREAST;  Surgeon: Wallace Going, DO;  Location: [place];   Service: Plastics;  Laterality: Left;  Marland Kitchen MASTECTOMY MODIFIED RADICAL Left 08/27/2017  . MASTECTOMY MODIFIED RADICAL Left 08/27/2017   Procedure: LEFT MODIFIED RADICAL MASTECTOMY;  Surgeon: Erroll Luna, MD;  Location: [place];  Service: General;  Laterality: Left;  Marland Kitchen MASTOPEXY Right 03/03/2019   Procedure: RIGHT BREAST MASTOPEXY/REDUCTION;  Surgeon: Wallace Going, DO;  Location: [place];  Service: Plastics;  Laterality: Right;  3 hours, please  . PORT-A-CATH REMOVAL Right 08/27/2017   Procedure: REMOVAL PORT-A-CATH RIGHT CHEST;  Surgeon: Erroll Luna, MD;  Location: [place];  Service: General;  Laterality: Right;  . PORTACATH PLACEMENT Right 01/28/2017   Procedure: INSERTION PORT-A-CATH WITH ULTRASOUND;  Surgeon: Erroll Luna, MD;  Location: [place];  Service: General;  Laterality: Right;  . REMOVAL OF TISSUE EXPANDER AND PLACEMENT OF IMPLANT Left 03/03/2019   Procedure: REMOVAL OF TISSUE EXPANDER AND PLACEMENT OF EXPANDER;  Surgeon: Wallace Going, DO;  Location: [place];  Service: Plastics;  Laterality: Left;  . REMOVAL OF TISSUE EXPANDER AND PLACEMENT OF IMPLANT Left 05/25/2019   Procedure: LEFT BREAST REMOVAL OF TISSUE EXPANDER AND PLACEMENT OF IMPLANT;  Surgeon: Wallace Going, DO;  Location: [place];  Service: Plastics;  Laterality: Left;  . TISSUE EXPANDER PLACEMENT Left 11/03/2018   Procedure: PLACEMENT OF TISSUE EXPANDER LEFT BREAST;  Surgeon: Wallace Going,  DO;  Location: [place];  Service: Plastics;  Laterality: Left;  Total case time is 3.5 hours  . TUBAL LIGATION Bilateral 01/21/2011    There were no vitals filed for this visit.  Subjective Assessment - 08/17/19 1609    Subjective  Pt has not been wearing her compression sleeve and she has not had the numbness in her arm. She does have numbness and tingling in her fingers but thinks it might be from the chemo She has not yet heard from [facility] to switch our her sleeves. Pt askes for more Medbridge exercises to get stronger    Pertinent History  Left breast cancer diagnosed 01/10/17 by mammogram and needle biopsy. Then had neo-adjuvant chemo starting 02/10/17 until 06/26/17; had left mastectomy 08/27/17 with ALND and immediate expander placed. Radiation completed. Expander removed and replaced on May 27, 19 b/c it did not get expanded enough prior to radiation, also had lat flap 11/02/17. Had expander removed and replaced again on 03/03/19 due to a small leak, pt had reduction on R breast at this time    Patient Stated Goals  to see if arm is ok, do what she needs to do to get back to exercising    Currently in Pain?  Yes    Pain Score  --   did not rate , but complains of knee pain           LYMPHEDEMA/ONCOLOGY QUESTIONNAIRE - 08/17/19 1542      Left Upper Extremity Lymphedema   10 cm Proximal to Olecranon Process  41 cm    Olecranon Process  31.5 cm    15 cm Proximal to Ulnar Styloid Process  30.5 cm    10 cm Proximal to Ulnar Styloid Process  27.6 cm    Just Proximal to Ulnar Styloid Process  18.5 cm    Across Hand at [employer]  20.5 cm    At [place] of 2nd Digit  6.5 cm                [facility] Adult PT Treatment/Exercise - 08/17/19 0001      Exercises   Other Exercises   reviewed [place] exercises and included on Medbridge       Knee/Hip Exercises: Supine   Bridges  Strengthening;5 reps    Straight Leg Raises  Strengthening;Right;Left;5 reps    Straight Leg Raises Limitations  encouraged to keep quad set for lifting leg off mat       Knee/Hip Exercises: Sidelying   Hip ABduction  Right;Left;5 reps    Clams  5 reps with each leg       Manual Therapy   Manual Therapy  Edema management;Manual Lymphatic Drainage (MLD);Passive ROM    Edema Management  Remeasured arms     Soft tissue mobilization  Briefly to Lt pect insertion during P/ROM as this is still very tight palpably and per pt report     Manual Lymphatic Drainage (MLD)  briefly to left shoulder and upper arm     Passive ROM  to Lt shoulder in direction of abduction, flexion, and D2 with prolonged holds                  PT Long Term Goals - 08/17/19 1624      PT LONG TERM GOAL #1   Title  Pt will have well fiting prophylacitic compression garments for left arm    Time  8    Period  Weeks  Status  On-going      PT LONG TERM GOAL #2   Title  Pt will report the numbness in her left arm is decreased by 50%    Status  Achieved      PT LONG TERM GOAL #3   Title  Pt will report stiffness in both knees is decreased by 50%    Time  8    Period  Weeks    Status  On-going      PT LONG TERM GOAL #4   Title  Pt will be able to continue with an aquatic exercise program upon discontinuation of PT    Time  8    Period  Weeks    Status  On-going            Plan - 08/17/19 1618    Clinical Impression Statement  Pt has not worn compression sleeve since last week and has not had an increased in circumference. Contacted Jemela at [facility] and she will swith out sleeves for better fitting ones. Since lymphedema is stable and numnbess has decreased to just fingetips , Roselinda would like to focus on strengthening and balance until she can begin aquatic therapy.    Personal Factors and Comorbidities  Profession;Time since onset of injury/illness/exacerbation    Examination-Participation Restrictions  Community Activity;Cleaning    Stability/Clinical Decision Making  Stable/Uncomplicated    Clinical Impairments Affecting Rehab Potential  hx of radiation    PT Frequency  2x / week    PT Next Visit Plan  Focus on LE strength to decrease knee pain and improve balance.    PT Home Exercise Plan  Access Code: [email or web address], supine over foam roll, [place]wood for LUE, wall washing, 3 way raises, corner stretch, post op stretches for after surgery of Dr. Sherrine Maples, Strength ABC program 08/17/2019 Access Code: [email or web address]    Consulted and  Agree with Plan of Care  Patient       Patient will benefit from skilled therapeutic intervention in order to improve the following deficits and impairments:  Decreased range of motion, Impaired UE functional use, Decreased strength, Other (comment), Pain, Postural dysfunction  Visit Diagnosis: Stiffness of left shoulder, not elsewhere classified  Muscle weakness (generalized)  Aftercare following surgery for neoplasm  At risk for lymphedema  Stiffness of knee joint, right  Stiffness of knee joint, left  Acute pain of left shoulder     Problem List Patient Active Problem List   Diagnosis Date Noted  . Benign essential HTN 08/17/2018  . Morbid obesity with body mass index (BMI) of 40.0 to 49.9 ([place]) 09/17/2017  . 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 Malignant neoplasm of upper-inner quadrant of left breast in female, estrogen receptor positive ([place]) 07/27/2017  . Genetic testing 03/25/2017  . 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 Owens Shark PT   Norwood Levo 08/17/2019, 4:25 PM  [place], [place], [address] Phone: [phone]   Fax:  [phone]  Name: Mackenzie [name] MRN: [id number] Date of Birth: 06-Feb-1971

## 2019-08-19 ENCOUNTER — Other Ambulatory Visit: Payer: Self-pay

## 2019-08-19 ENCOUNTER — Ambulatory Visit: Payer: 59 | Admitting: Rehabilitation

## 2019-08-19 ENCOUNTER — Encounter: Payer: Self-pay | Admitting: Rehabilitation

## 2019-08-19 DIAGNOSIS — G8929 Other chronic pain: Secondary | ICD-10-CM | POA: Diagnosis not present

## 2019-08-19 DIAGNOSIS — Z483 Aftercare following surgery for neoplasm: Secondary | ICD-10-CM | POA: Diagnosis not present

## 2019-08-19 DIAGNOSIS — M25561 Pain in right knee: Secondary | ICD-10-CM | POA: Diagnosis not present

## 2019-08-19 DIAGNOSIS — M25662 Stiffness of left knee, not elsewhere classified: Secondary | ICD-10-CM | POA: Diagnosis not present

## 2019-08-19 DIAGNOSIS — M25661 Stiffness of right knee, not elsewhere classified: Secondary | ICD-10-CM | POA: Diagnosis not present

## 2019-08-19 DIAGNOSIS — M6281 Muscle weakness (generalized): Secondary | ICD-10-CM | POA: Diagnosis not present

## 2019-08-19 DIAGNOSIS — M25612 Stiffness of left shoulder, not elsewhere classified: Secondary | ICD-10-CM | POA: Diagnosis not present

## 2019-08-19 DIAGNOSIS — Z9189 Other specified personal risk factors, not elsewhere classified: Secondary | ICD-10-CM | POA: Diagnosis not present

## 2019-08-19 DIAGNOSIS — M25562 Pain in left knee: Secondary | ICD-10-CM | POA: Diagnosis not present

## 2019-08-19 NOTE — Therapy (Signed)
Mackenzie [name], [place], [address] Phone: [phone]   Fax:  [phone]  Physical Therapy Treatment  Patient Details  Name: Mackenzie [name] MRN: [id number] Date of Birth: 09-30-1970 Referring Provider (PT): [name]    Encounter Date: 08/19/2019  PT End of Session - 08/19/19 0836    Visit Number  4    Number of Visits  17    Date for PT Re-Evaluation  10/10/19    PT Start Time  0812   arrives late   PT Stop Time  0858    PT Time Calculation (min)  46 min    Activity Tolerance  Patient tolerated treatment well    Behavior During Therapy  [facility] for tasks assessed/performed       Past Medical History:  Diagnosis Date  . Abnormal glucose 2018  . 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 Allergic rhinitis 2012  . Anemia 01/27/2017   prior to starting chemotherapy  . Breast cancer ([place]) 01/14/2017   Left breast  . 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 Edema, lower extremity   . Fatty liver   . Hot flashes 03/2017  . Hyperlipidemia 03/20/2016  . Morbid obesity with body mass index (BMI) of 40.0 to 49.9 ([place]) 09/17/2017  . NCGS (non-celiac gluten sensitivity)   . Pre-diabetes   . Seasonal allergies 2012   seasonal allergies causes allergic rhinitis and itchy, dry eyes per pt  . Vitamin D deficiency 05/2015    Past Surgical History:  Procedure Laterality Date  . BREAST RECONSTRUCTION WITH PLACEMENT OF TISSUE EXPANDER AND FLEX HD (ACELLULAR HYDRATED DERMIS) Left 08/27/2017   Procedure: LEFT BREAST RECONSTRUCTION WITH PLACEMENT OF TISSUE EXPANDER AND FLEX HD;  Surgeon: Wallace Going, DO;  Location: [place];  Service: Plastics;  Laterality: Left;  . CESAREAN SECTION     x2  . LATISSIMUS FLAP TO BREAST Left 11/03/2018  . LATISSIMUS FLAP TO BREAST Left 11/03/2018   Procedure: LATISSIMUS FLAP TO LEFT BREAST;  Surgeon: Wallace Going, DO;   Location: [place];  Service: Plastics;  Laterality: Left;  Marland Kitchen MASTECTOMY MODIFIED RADICAL Left 08/27/2017  . MASTECTOMY MODIFIED RADICAL Left 08/27/2017   Procedure: LEFT MODIFIED RADICAL MASTECTOMY;  Surgeon: Erroll Luna, MD;  Location: [place];  Service: General;  Laterality: Left;  Marland Kitchen MASTOPEXY Right 03/03/2019   Procedure: RIGHT BREAST MASTOPEXY/REDUCTION;  Surgeon: Wallace Going, DO;  Location: [place];  Service: Plastics;  Laterality: Right;  3 hours, please  . PORT-A-CATH REMOVAL Right 08/27/2017   Procedure: REMOVAL PORT-A-CATH RIGHT CHEST;  Surgeon: Erroll Luna, MD;  Location: [place];  Service: General;  Laterality: Right;  . PORTACATH PLACEMENT Right 01/28/2017   Procedure: INSERTION PORT-A-CATH WITH ULTRASOUND;  Surgeon: Erroll Luna, MD;  Location: [place];  Service: General;  Laterality: Right;  . REMOVAL OF TISSUE EXPANDER AND PLACEMENT OF IMPLANT Left 03/03/2019   Procedure: REMOVAL OF TISSUE EXPANDER AND PLACEMENT OF EXPANDER;  Surgeon: Wallace Going, DO;  Location: [place];  Service: Plastics;  Laterality: Left;  . REMOVAL OF TISSUE EXPANDER AND PLACEMENT OF IMPLANT Left 05/25/2019   Procedure: LEFT BREAST REMOVAL OF TISSUE EXPANDER AND PLACEMENT OF IMPLANT;  Surgeon: Wallace Going, DO;  Location: [place];  Service: Plastics;  Laterality: Left;  . TISSUE EXPANDER PLACEMENT Left 11/03/2018   Procedure: PLACEMENT OF TISSUE EXPANDER LEFT BREAST;  Surgeon:  Dillingham, Loel Lofty, DO;  Location: [place];  Service: Plastics;  Laterality: Left;  Total case time is 3.5 hours  . TUBAL LIGATION Bilateral 01/21/2011    There were no vitals filed for this visit.  Subjective Assessment - 08/19/19 0814    Subjective  I am doing ok.  I am ready do LE exercise. [facility]    Pertinent History  Left breast cancer diagnosed 01/10/17 by mammogram and needle biopsy.  Then had neo-adjuvant chemo starting 02/10/17 until 06/26/17; had left mastectomy 08/27/17 with ALND and immediate expander placed. Radiation completed. Expander removed and replaced on May 27, 19 b/c it did not get expanded enough prior to radiation, also had lat flap 11/02/17. Had expander removed and replaced again on 03/03/19 due to a small leak, pt had reduction on R breast at this time    Patient Stated Goals  to see if arm is ok, do what she needs to do to get back to exercising    Currently in Pain?  Yes    Pain Score  2     Pain Location  Knee    Pain Orientation  Right;Left    Pain Descriptors / Indicators  Aching    Pain Type  Chronic pain    Pain Onset  More than a month ago    Pain Frequency  Constant    Aggravating Factors   walking, work    Pain Relieving Factors  shots         [facility] PT Assessment - 08/19/19 0001      Observation/Other Assessments   Observations  checked pts shoes and pt has good supportive tennis shoes and good feet inserts; more foot pronation Rt>Lt with decreased weight bearing with knee bend                   [facility] Adult PT Treatment/Exercise - 08/19/19 0001      High Level Balance   High Level Balance Comments  at plinth standing on blue foam Eyes open/ Eyes closed 10"      Exercises   Exercises  Knee/Hip      Knee/Hip Exercises: Aerobic   Nustep  level 3 x 97min for bil LE strength and AROM       Knee/Hip Exercises: Standing   Hip Abduction  Both;10 reps    Abduction Limitations  2# holding table with finger tips      Knee/Hip Exercises: Seated   Long Arc Quad  Both;5 reps    Long Arc Quad Weight  2 lbs.    Long [employer] Limitations  5" holds      Knee/Hip Exercises: Supine   Bridges  Strengthening;10 reps    Straight Leg Raises  Strengthening;5 reps    Straight Leg Raises Limitations  2# with education on importance of quad strengthening and hip for general knee pain      Knee/Hip Exercises: Sidelying   Clams  5 each with PT  hold of hip for isolation      Manual Therapy   Passive ROM  to Lt shoulder in direction of abduction, flexion, and D2 with prolonged holds                  PT Long Term Goals - 08/17/19 1624      PT LONG TERM GOAL #1   Title  Pt will have well fiting prophylacitic compression garments for left arm  Time  8    Period  Weeks    Status  On-going      PT LONG TERM GOAL #2   Title  Pt will report the numbness in her left arm is decreased by 50%    Status  Achieved      PT LONG TERM GOAL #3   Title  Pt will report stiffness in both knees is decreased by 50%    Time  8    Period  Weeks    Status  On-going      PT LONG TERM GOAL #4   Title  Pt will be able to continue with an aquatic exercise program upon discontinuation of PT    Time  8    Period  Weeks    Status  On-going            Plan - 08/19/19 0837    Clinical Impression Statement  Transitioned to LE strength focus today did not includemuch balance due to pt late arrival but started foam eyes closed work needing only CGA/SBA and no LOB.  Able to add 2# weight to LE exercises and increased reps as tolerated.  Pt has good shoes and inserts already.    PT Frequency  2x / week    PT Duration  8 weeks    PT Treatment/Interventions  ADLs/Self Care Home Management;Therapeutic exercise;Patient/family education;Manual techniques;Manual lymph drainage;Compression bandaging;Scar mobilization;Passive range of motion;Therapeutic activities;Aquatic Therapy;Orthotic Fit/Training    PT Next Visit Plan  Focus on LE strength to decrease knee pain and improve balance.    PT Home Exercise Plan  Access Code: [email or web address], supine over foam roll, [place] for LUE, wall washing, 3 way raises, corner stretch, post op stretches for after surgery of Dr. Sherrine Maples, Strength ABC program 08/17/2019 Access Code: [email or web address]    Consulted and Agree with Plan of Care  Patient       Patient will benefit from skilled therapeutic intervention in order to  improve the following deficits and impairments:     Visit Diagnosis: Aftercare following surgery for neoplasm  Chronic pain of right knee  Chronic pain of left knee     Problem List Patient Active Problem List   Diagnosis Date Noted  . Benign essential HTN 08/17/2018  . Morbid obesity with body mass index (BMI) of 40.0 to 49.9 ([place]) 09/17/2017  . 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 Malignant neoplasm of upper-inner quadrant of left breast in female, estrogen receptor positive ([place]) 07/27/2017  . Genetic testing 03/25/2017  . Malignant 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, estrogen receptor positive ([place]) 01/23/2017  . 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

## 2019-08-22 ENCOUNTER — Inpatient Hospital Stay: Payer: 59 | Attending: Adult Health

## 2019-08-22 ENCOUNTER — Other Ambulatory Visit: Payer: Self-pay

## 2019-08-22 ENCOUNTER — Other Ambulatory Visit: Payer: 59

## 2019-08-22 VITALS — BP 140/88 | HR 74 | Temp 98.2°F | Resp 18

## 2019-08-22 DIAGNOSIS — Z5111 Encounter for antineoplastic chemotherapy: Secondary | ICD-10-CM | POA: Diagnosis not present

## 2019-08-22 DIAGNOSIS — C50812 Malignant neoplasm of overlapping sites of left female breast: Secondary | ICD-10-CM

## 2019-08-22 DIAGNOSIS — C50212 Malignant neoplasm of upper-inner quadrant of left female breast: Secondary | ICD-10-CM | POA: Insufficient documentation

## 2019-08-22 DIAGNOSIS — Z170 Estrogen receptor positive status [ER+]: Secondary | ICD-10-CM | POA: Insufficient documentation

## 2019-08-22 DIAGNOSIS — Z006 Encounter for examination for normal comparison and control in clinical research program: Secondary | ICD-10-CM | POA: Diagnosis not present

## 2019-08-22 MED ORDER — GOSERELIN ACETATE 3.6 MG ~~LOC~~ IMPL
3.6000 mg | DRUG_IMPLANT | Freq: Once | SUBCUTANEOUS | Status: AC
  Administered 2019-08-22: 3.6 mg via SUBCUTANEOUS

## 2019-08-22 MED ORDER — GOSERELIN ACETATE 3.6 MG ~~LOC~~ IMPL
DRUG_IMPLANT | SUBCUTANEOUS | Status: AC
  Filled 2019-08-22: qty 3.6

## 2019-08-22 NOTE — Patient Instructions (Signed)

## 2019-08-23 ENCOUNTER — Ambulatory Visit (INDEPENDENT_AMBULATORY_CARE_PROVIDER_SITE_OTHER): Payer: 59 | Admitting: Family Medicine

## 2019-08-23 ENCOUNTER — Telehealth: Payer: Self-pay | Admitting: *Deleted

## 2019-08-23 ENCOUNTER — Encounter (INDEPENDENT_AMBULATORY_CARE_PROVIDER_SITE_OTHER): Payer: Self-pay | Admitting: Family Medicine

## 2019-08-23 ENCOUNTER — Ambulatory Visit: Payer: 59 | Admitting: Physical Therapy

## 2019-08-23 VITALS — BP 129/78 | HR 83 | Temp 98.0°F | Ht 65.0 in | Wt 263.0 lb

## 2019-08-23 DIAGNOSIS — M6281 Muscle weakness (generalized): Secondary | ICD-10-CM

## 2019-08-23 DIAGNOSIS — Z483 Aftercare following surgery for neoplasm: Secondary | ICD-10-CM

## 2019-08-23 DIAGNOSIS — E7849 Other hyperlipidemia: Secondary | ICD-10-CM

## 2019-08-23 DIAGNOSIS — M25612 Stiffness of left shoulder, not elsewhere classified: Secondary | ICD-10-CM | POA: Diagnosis not present

## 2019-08-23 DIAGNOSIS — R7303 Prediabetes: Secondary | ICD-10-CM

## 2019-08-23 DIAGNOSIS — Z6841 Body Mass Index (BMI) 40.0 and over, adult: Secondary | ICD-10-CM

## 2019-08-23 DIAGNOSIS — M25561 Pain in right knee: Secondary | ICD-10-CM

## 2019-08-23 DIAGNOSIS — G8929 Other chronic pain: Secondary | ICD-10-CM

## 2019-08-23 DIAGNOSIS — E559 Vitamin D deficiency, unspecified: Secondary | ICD-10-CM

## 2019-08-23 DIAGNOSIS — M25662 Stiffness of left knee, not elsewhere classified: Secondary | ICD-10-CM

## 2019-08-23 DIAGNOSIS — Z9189 Other specified personal risk factors, not elsewhere classified: Secondary | ICD-10-CM | POA: Diagnosis not present

## 2019-08-23 DIAGNOSIS — M25661 Stiffness of right knee, not elsewhere classified: Secondary | ICD-10-CM

## 2019-08-23 DIAGNOSIS — M25562 Pain in left knee: Secondary | ICD-10-CM | POA: Diagnosis not present

## 2019-08-23 DIAGNOSIS — M25512 Pain in left shoulder: Secondary | ICD-10-CM

## 2019-08-23 MED ORDER — METFORMIN HCL 500 MG PO TABS: 500.0000 mg | ORAL_TABLET | Freq: Every morning | ORAL | 0 refills | Status: DC

## 2019-08-23 MED FILL — metFORMIN HCL 500 MG TABS: 500 | 30 days supply | Qty: 30 | Fill #0

## 2019-08-23 NOTE — Patient Instructions (Signed)
Access Code: [email or web address] URL: [email or web address] Date: 08/23/2019 Prepared by: Maudry Diego  Program Notes Focus on activating the core muscles before each exercise. Make sure to breathe as you activate and if you feel like you lose the core activation then just stop and start again after reactivating.   Exercises Supine Posterior Pelvic Tilt - 1 x daily - 7 x weekly - 10 reps - 3 sets Supine 90/90 Alternating Toe Touch - 1 x daily - 5 x weekly - 10 reps - 2 sets - 3 hold Supine Transversus Abdominis Bracing with Leg Extension - 1 x daily - 7 x weekly - 10 reps - 1 sets - 6 seconds-10 seconds hold Supine 90/90 Abdominal Bracing - 1 x daily - 7 x weekly - 10 reps - 10 seconds hold Supine Bridge - 1 x daily - 7 x weekly - 10 reps - 10 seconds hold Hooklying Isometric Clamshell - 1 x daily - 7 x weekly - 10 reps - 6 seconds hold Supine Straight Leg Raises - 1 x daily - 7 x weekly - 10 reps - 3 sets Sidelying Hip Abduction - 1 x daily - 7 x weekly - 10 reps - 3 sets

## 2019-08-23 NOTE — Telephone Encounter (Signed)
Mackenzie [name]; Patient called to report her weight loss MD wants her to start glucophagel. Patient asks if ok to take this medication while on [name].  Informed patient that glucophage is not prohibited on [name] and there is no known interaction with ribociclib. Patient verbalized understanding and says she is going to try it. Thanked patient for checking first and encouraged her to call again if any further questions.  Foye Spurling, BSN, RN Clinical Research Nurse 08/23/2019 4:01 PM

## 2019-08-23 NOTE — Progress Notes (Signed)
Chief Complaint:   OBESITY Mackenzie [name] is here to discuss her progress with her obesity treatment plan along with follow-up of her obesity related diagnoses. Mackenzie [name] is on the Category 3 Plan and states she is following her eating plan approximately 75% of the time. Mackenzie [name] states she is doing 0 minutes 0 times per week.  Today's visit was #: 2 Starting weight: 267 lbs Starting date: 08/09/2019 Today's weight: 263 lbs Today's date: 08/23/2019 Total lbs lost to date: 4 Total lbs lost since last in-office visit: 4  Interim History: Mackenzie [name] felt that she did quite well on the eating plan. She reports that her hunger was well controlled. She modified the eating plan by eating her dinner meal at lunch time. She would enjoy more breakfast options.  Subjective:   1. Other hyperlipidemia Mackenzie [name]'s LDL is 115, but normal HDL and triglycerides, and she is not on statin. I discussed labs with the patient today.  2. Vitamin D deficiency Mackenzie [name]'s Vit D is 92.5. She is currently taking D3 5,000 IU daily plus calcium, plus Vit D. She is now at high risk for over-replacement. She denies nausea or vomiting however reported her fingertips tingling. I discussed labs with the patient today.  3. Pre-diabetes Mackenzie [name] has a new diagnosis of pre-diabetes. Her A1c is 5.9, and she denies episodes of hypoglycemia. She denies polyphagia on her Category 3 meal plan. I discussed labs with the patient today.  Assessment/Plan:   1. Other hyperlipidemia Cardiovascular risk and specific lipid/LDL goals reviewed. We discussed several lifestyle modifications today and Mackenzie [name] will continue her Category 3 meal plan, and will continue to work on exercise and weight loss efforts. We will recheck labs in 3 months. Orders and follow up as documented in patient record.   2. Vitamin D deficiency Low Vitamin D level contributes to fatigue and are associated with obesity, breast, and colon cancer. Mackenzie [name] is to stop taking D3  5,000 IU daily and continue calcium. She will follow-up for routine testing of Vitamin D, at least 2-3 times per year to avoid over-replacement. We will recheck labs in 3 months.  3. Pre-diabetes Mackenzie [name] will continue her Category 3 meal plan, and will continue to work on weight loss, exercise, and decreasing simple carbohydrates to help decrease the risk of diabetes. Mackenzie [name] agreed to start metformin 500 mg daily with no refills.  - metFORMIN (GLUCOPHAGE) 500 MG tablet; Take 1 tablet (500 mg total) by mouth every morning.  Dispense: 30 tablet; Refill: 0  4. Class 3 severe obesity with serious comorbidity and body mass index (BMI) of 40.0 to 44.9 in adult, unspecified obesity type (HCC) Mackenzie [name] is currently in the action stage of change. As such, her goal is to continue with weight loss efforts. She has agreed to the Category 3 Plan with breakfast options.   Exercise goals: As is.  Behavioral modification strategies: no skipping meals.  Mackenzie [name] has agreed to follow-up with our clinic in 2 weeks. She was informed of the importance of frequent follow-up visits to maximize her success with intensive lifestyle modifications for her multiple health conditions.   Objective:   Blood pressure 129/78, pulse 83, temperature 98 F (36.7 C), temperature source Oral, height 5\' 5"  (1.651 m), weight 263 lb (119.3 kg), last menstrual period 02/10/2017, SpO2 100 %, unknown if currently breastfeeding. Body mass index is 43.77 kg/m.  General: Cooperative, alert, well developed, in no acute distress. HEENT: Conjunctivae and lids unremarkable. Cardiovascular: Regular rhythm.  Lungs: Normal work of breathing.  Neurologic: No focal deficits.   Lab Results  Component Value Date   CREATININE 0.88 08/09/2019   BUN 13 08/09/2019   NA 136 08/09/2019   K 4.3 08/09/2019   CL 100 08/09/2019   CO2 25 08/09/2019   Lab Results  Component Value Date   ALT 20 08/09/2019   AST 19 08/09/2019   ALKPHOS 157 (H)  08/09/2019   BILITOT 0.4 08/09/2019   Lab Results  Component Value Date   HGBA1C 5.9 (H) 08/09/2019   HGBA1C 5.9 (H) 07/26/2018   Lab Results  Component Value Date   INSULIN 12.6 08/09/2019   Lab Results  Component Value Date   TSH 0.657 08/09/2019   Lab Results  Component Value Date   CHOL 191 08/09/2019   HDL 68 08/09/2019   LDLCALC 115 (H) 08/09/2019   TRIG 42 08/09/2019   Lab Results  Component Value Date   WBC 6.0 08/09/2019   HGB 12.7 08/09/2019   HCT 38.1 08/09/2019   MCV 87 08/09/2019   PLT 327 08/09/2019   No results found for: IRON, TIBC, FERRITIN  Attestation Statements:   Reviewed by clinician on day of visit: allergies, medications, problem list, medical history, surgical history, family history, social history, and previous encounter notes.   I, Trixie Dredge, am acting as transcriptionist for Dennard Nip, MD.  I have reviewed the above documentation for accuracy and completeness, and I agree with the above. -  Dennard Nip, MD

## 2019-08-23 NOTE — Therapy (Signed)
[place], [place], [address] Phone: [phone]   Fax:  [phone]  Physical Therapy Treatment  Patient Details  Name: Mackenzie [name] MRN: [id number] Date of Birth: January 24, 1971 Referring Provider (PT): [name]    Encounter Date: 08/23/2019  PT End of Session - 08/23/19 1557    Visit Number  5    Number of Visits  17    Date for PT Re-Evaluation  10/10/19    PT Start Time  1410    PT Stop Time  1455    PT Time Calculation (min)  45 min    Activity Tolerance  Patient tolerated treatment well    Behavior During Therapy  [facility] for tasks assessed/performed       Past Medical History:  Diagnosis Date  . Abnormal glucose 2018  . 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 Allergic rhinitis 2012  . Anemia 01/27/2017   prior to starting chemotherapy  . Breast cancer ([place]) 01/14/2017   Left breast  . 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 Edema, lower extremity   . Fatty liver   . Hot flashes 03/2017  . Hyperlipidemia 03/20/2016  . Morbid obesity with body mass index (BMI) of 40.0 to 49.9 ([place]) 09/17/2017  . NCGS (non-celiac gluten sensitivity)   . Pre-diabetes   . Seasonal allergies 2012   seasonal allergies causes allergic rhinitis and itchy, dry eyes per pt  . Vitamin D deficiency 05/2015    Past Surgical History:  Procedure Laterality Date  . BREAST RECONSTRUCTION WITH PLACEMENT OF TISSUE EXPANDER AND FLEX HD (ACELLULAR HYDRATED DERMIS) Left 08/27/2017   Procedure: LEFT BREAST RECONSTRUCTION WITH PLACEMENT OF TISSUE EXPANDER AND FLEX HD;  Surgeon: Wallace Going, DO;  Location: [name];  Service: Plastics;  Laterality: Left;  . CESAREAN SECTION     x2  . LATISSIMUS FLAP TO BREAST Left 11/03/2018  . LATISSIMUS FLAP TO BREAST Left 11/03/2018   Procedure: LATISSIMUS FLAP TO LEFT BREAST;  Surgeon: Wallace Going, DO;  Location: [place];   Service: Plastics;  Laterality: Left;  Marland Kitchen MASTECTOMY MODIFIED RADICAL Left 08/27/2017  . MASTECTOMY MODIFIED RADICAL Left 08/27/2017   Procedure: LEFT MODIFIED RADICAL MASTECTOMY;  Surgeon: Erroll Luna, MD;  Location: [place];  Service: General;  Laterality: Left;  Marland Kitchen MASTOPEXY Right 03/03/2019   Procedure: RIGHT BREAST MASTOPEXY/REDUCTION;  Surgeon: Wallace Going, DO;  Location: [place];  Service: Plastics;  Laterality: Right;  3 hours, please  . PORT-A-CATH REMOVAL Right 08/27/2017   Procedure: REMOVAL PORT-A-CATH RIGHT CHEST;  Surgeon: Erroll Luna, MD;  Location: [place];  Service: General;  Laterality: Right;  . PORTACATH PLACEMENT Right 01/28/2017   Procedure: INSERTION PORT-A-CATH WITH ULTRASOUND;  Surgeon: Erroll Luna, MD;  Location: [place];  Service: General;  Laterality: Right;  . REMOVAL OF TISSUE EXPANDER AND PLACEMENT OF IMPLANT Left 03/03/2019   Procedure: REMOVAL OF TISSUE EXPANDER AND PLACEMENT OF EXPANDER;  Surgeon: Wallace Going, DO;  Location: [place];  Service: Plastics;  Laterality: Left;  . REMOVAL OF TISSUE EXPANDER AND PLACEMENT OF IMPLANT Left 05/25/2019   Procedure: LEFT BREAST REMOVAL OF TISSUE EXPANDER AND PLACEMENT OF IMPLANT;  Surgeon: Wallace Going, DO;  Location: [place];  Service: Plastics;  Laterality: Left;  . TISSUE EXPANDER PLACEMENT Left 11/03/2018   Procedure: PLACEMENT OF TISSUE EXPANDER LEFT BREAST;  Surgeon: Wallace Going,  DO;  Location: [place];  Service: Plastics;  Laterality: Left;  Total case time is 3.5 hours  . TUBAL LIGATION Bilateral 01/21/2011    There were no vitals filed for this visit.  Subjective Assessment - 08/23/19 1411    Subjective  Pt talked with Vinnie Level and will start aqua aerobics and is getting it scheduled. She is trying to figure out how she can get more exercise in. She wants to do more things with her 51 year old .    Pertinent  History  Left breast cancer diagnosed 01/10/17 by mammogram and needle biopsy. Then had neo-adjuvant chemo starting 02/10/17 until 06/26/17; had left mastectomy 08/27/17 with ALND and immediate expander placed. Radiation completed. Expander removed and replaced on May 27, 19 b/c it did not get expanded enough prior to radiation, also had lat flap 11/02/17. Had expander removed and replaced again on 03/03/19 due to a small leak, pt had reduction on R breast at this time    Patient Stated Goals  to see if arm is ok, do what she needs to do to get back to exercising so she can do things with her 51 year old    Currently in Pain?  Yes    Pain Score  4     Pain Location  Knee    Pain Orientation  Right;Left    Pain Descriptors / Indicators  Nagging    Pain Type  Chronic pain    Pain Onset  More than a month ago    Aggravating Factors   walking and steps                       [facility] Adult PT Treatment/Exercise - 08/23/19 0001      Balance Poses: Yoga   Warrior I  1 rep;15 seconds   on each side as  cool down stretch      Exercises   Exercises  Neck;Shoulder;Lumbar;Knee/Hip      Neck Exercises: Seated   Other Seated Exercise  neck and upper thoracic ROM for warm up       Lumbar Exercises: Stretches   Pelvic Tilt  5 reps      Lumbar Exercises: Standing   Heel Raises  10 reps    Functional Squats  10 reps   from hight mat   Other Standing Lumbar Exercises  slide arm up the wall and weight shift onto forward leg 5 reps with each side       Lumbar Exercises: Supine   Bridge  5 reps    Bridge with [employer]  5 reps      Knee/Hip Exercises: Standing   Other Standing Knee Exercises  hip hinge with 6 pounds on dowel x 10 reps with frequent cues for form       Knee/Hip Exercises: Seated   Other Seated Knee/Hip Exercises  feet press into floor 5 reps with each leg       Knee/Hip Exercises: Sidelying   Hip ABduction  Right;Left;5 reps    Clams  5 reps with each leg        Shoulder Exercises: Sidelying   External Rotation  Strengthening;Right;Left;10 reps;Weights    External Rotation Weight (lbs)  2    Flexion  Strengthening;Right;Left;10 reps;Weights    Flexion Weight (lbs)  2    ABduction  Strengthening;Right;Left;10 reps;Weights    ABduction Weight (lbs)  2             PT Education -  08/23/19 1557    Education Details  medbridge exercise for core and hips    Person(s) Educated  Patient    Methods  Explanation    Comprehension  Verbalized understanding          PT Long Term Goals - 08/17/19 1624      PT LONG TERM GOAL #1   Title  Pt will have well fiting prophylacitic compression garments for left arm    Time  8    Period  Weeks    Status  On-going      PT LONG TERM GOAL #2   Title  Pt will report the numbness in her left arm is decreased by 50%    Status  Achieved      PT LONG TERM GOAL #3   Title  Pt will report stiffness in both knees is decreased by 50%    Time  8    Period  Weeks    Status  On-going      PT LONG TERM GOAL #4   Title  Pt will be able to continue with an aquatic exercise program upon discontinuation of PT    Time  8    Period  Weeks    Status  On-going            Plan - 08/23/19 1600    PT Home Exercise Plan  Access Code: [email or web address], supine over foam roll, rockwood for LUE, wall washing, 3 way raises, corner stretch, post op stretches for after surgery of Dr. Sherrine Maples, Strength ABC program 08/17/2019 Access Code: [email or web address] 08/23/2019 hips and core exericse program  Access Code: [email or web address]    Consulted and Agree with Plan of Care  Patient       Patient will benefit from skilled therapeutic intervention in order to improve the following deficits and impairments:  Decreased range of motion, Impaired UE functional use, Decreased strength, Other (comment), Pain, Postural dysfunction  Visit Diagnosis: Aftercare following surgery for neoplasm  Chronic pain of right knee  Chronic pain of left knee  Stiffness of  left shoulder, not elsewhere classified  Muscle weakness (generalized)  At risk for lymphedema  Stiffness of knee joint, right  Stiffness of knee joint, left  Acute pain of left shoulder     Problem List Patient Active Problem List   Diagnosis Date Noted  . Benign essential HTN 08/17/2018  . Morbid obesity with body mass index (BMI) of 40.0 to 49.9 ([place]) 09/17/2017  . 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 Malignant neoplasm of upper-inner quadrant of left breast in female, estrogen receptor positive ([place]) 07/27/2017  . Genetic testing 03/25/2017  . 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 Owens Shark PT  Norwood Levo 08/23/2019, 4:01 PM  [place], [place], [address] Phone: [phone]   Fax:  [phone]  Name: Mackenzie [name] MRN: [id number] Date of Birth: Jan 16, 1971

## 2019-08-25 ENCOUNTER — Other Ambulatory Visit: Payer: Self-pay

## 2019-08-25 ENCOUNTER — Encounter: Payer: Self-pay | Admitting: Physical Therapy

## 2019-08-25 ENCOUNTER — Ambulatory Visit: Payer: 59 | Admitting: Physical Therapy

## 2019-08-25 DIAGNOSIS — M25612 Stiffness of left shoulder, not elsewhere classified: Secondary | ICD-10-CM | POA: Diagnosis not present

## 2019-08-25 DIAGNOSIS — M25662 Stiffness of left knee, not elsewhere classified: Secondary | ICD-10-CM

## 2019-08-25 DIAGNOSIS — Z483 Aftercare following surgery for neoplasm: Secondary | ICD-10-CM

## 2019-08-25 DIAGNOSIS — R278 Other lack of coordination: Secondary | ICD-10-CM

## 2019-08-25 DIAGNOSIS — R252 Cramp and spasm: Secondary | ICD-10-CM

## 2019-08-25 DIAGNOSIS — M25661 Stiffness of right knee, not elsewhere classified: Secondary | ICD-10-CM | POA: Diagnosis not present

## 2019-08-25 DIAGNOSIS — Z9189 Other specified personal risk factors, not elsewhere classified: Secondary | ICD-10-CM | POA: Diagnosis not present

## 2019-08-25 DIAGNOSIS — M25562 Pain in left knee: Secondary | ICD-10-CM | POA: Diagnosis not present

## 2019-08-25 DIAGNOSIS — M25512 Pain in left shoulder: Secondary | ICD-10-CM

## 2019-08-25 DIAGNOSIS — G8929 Other chronic pain: Secondary | ICD-10-CM

## 2019-08-25 DIAGNOSIS — M25561 Pain in right knee: Secondary | ICD-10-CM | POA: Diagnosis not present

## 2019-08-25 DIAGNOSIS — M6281 Muscle weakness (generalized): Secondary | ICD-10-CM

## 2019-08-25 NOTE — Therapy (Signed)
[place], [place], [address] Phone: [phone]   Fax:  [phone]  Physical Therapy Treatment  Patient Details  Name: Mackenzie [name] MRN: [id number] Date of Birth: [birth year] Referring Provider (PT): [name]    Encounter Date: 08/25/2019  PT End of Session - 08/25/19 1408    Visit Number  6    Number of Visits  17    Date for PT Re-Evaluation  10/10/19    PT Start Time  [id number]    PT Stop Time  1350    PT Time Calculation (min)  45 min    Activity Tolerance  Patient tolerated treatment well    Behavior During Therapy  [facility] for tasks assessed/performed       Past Medical History:  Diagnosis Date  . Abnormal glucose 2018  . 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 Allergic rhinitis 2012  . Anemia 01/27/2017   prior to starting chemotherapy  . Breast cancer ([place]) 01/14/2017   Left breast  . 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 Edema, lower extremity   . Fatty liver   . Hot flashes 03/2017  . Hyperlipidemia 03/20/2016  . Morbid obesity with body mass index (BMI) of 40.0 to 49.9 ([place]) 09/17/2017  . NCGS (non-celiac gluten sensitivity)   . Pre-diabetes   . Seasonal allergies 2012   seasonal allergies causes allergic rhinitis and itchy, dry eyes per pt  . Vitamin D deficiency 05/2015    Past Surgical History:  Procedure Laterality Date  . BREAST RECONSTRUCTION WITH PLACEMENT OF TISSUE EXPANDER AND FLEX HD (ACELLULAR HYDRATED DERMIS) Left 08/27/2017   Procedure: LEFT BREAST RECONSTRUCTION WITH PLACEMENT OF TISSUE EXPANDER AND FLEX HD;  Surgeon: Wallace Going, DO;  Location: [place];  Service: Plastics;  Laterality: Left;  . CESAREAN SECTION     x2  . LATISSIMUS FLAP TO BREAST Left 11/03/2018  . LATISSIMUS FLAP TO BREAST Left 11/03/2018   Procedure: LATISSIMUS FLAP TO LEFT BREAST;  Surgeon: Wallace Going, DO;  Location: [place];   Service: Plastics;  Laterality: Left;  Marland Kitchen MASTECTOMY MODIFIED RADICAL Left 08/27/2017  . MASTECTOMY MODIFIED RADICAL Left 08/27/2017   Procedure: LEFT MODIFIED RADICAL MASTECTOMY;  Surgeon: Erroll Luna, MD;  Location: [place];  Service: General;  Laterality: Left;  Marland Kitchen MASTOPEXY Right 03/03/2019   Procedure: RIGHT BREAST MASTOPEXY/REDUCTION;  Surgeon: Wallace Going, DO;  Location: [place];  Service: Plastics;  Laterality: Right;  3 hours, please  . PORT-A-CATH REMOVAL Right 08/27/2017   Procedure: REMOVAL PORT-A-CATH RIGHT CHEST;  Surgeon: Erroll Luna, MD;  Location: [place];  Service: General;  Laterality: Right;  . PORTACATH PLACEMENT Right 01/28/2017   Procedure: INSERTION PORT-A-CATH WITH ULTRASOUND;  Surgeon: Erroll Luna, MD;  Location: [place];  Service: General;  Laterality: Right;  . REMOVAL OF TISSUE EXPANDER AND PLACEMENT OF IMPLANT Left 03/03/2019   Procedure: REMOVAL OF TISSUE EXPANDER AND PLACEMENT OF EXPANDER;  Surgeon: Wallace Going, DO;  Location: [place];  Service: Plastics;  Laterality: Left;  . REMOVAL OF TISSUE EXPANDER AND PLACEMENT OF IMPLANT Left 05/25/2019   Procedure: LEFT BREAST REMOVAL OF TISSUE EXPANDER AND PLACEMENT OF IMPLANT;  Surgeon: Wallace Going, DO;  Location: [place];  Service: Plastics;  Laterality: Left;  . TISSUE EXPANDER PLACEMENT Left 11/03/2018   Procedure: PLACEMENT OF TISSUE EXPANDER LEFT BREAST;  Surgeon: Wallace Going,  DO;  Location: [place];  Service: Plastics;  Laterality: Left;  Total case time is 3.5 hours  . TUBAL LIGATION Bilateral 01/21/2011    There were no vitals filed for this visit.  Subjective Assessment - 08/25/19 1312    Subjective  Pt says she will start aquatic therapy on March 31    Pertinent History  Left breast cancer diagnosed 01/10/17 by mammogram and needle biopsy. Then had neo-adjuvant chemo starting 02/10/17 until 06/26/17; had left  mastectomy 08/27/17 with ALND and immediate expander placed. Radiation completed. Expander removed and replaced on May 27, 19 b/c it did not get expanded enough prior to radiation, also had lat flap 11/02/17. Had expander removed and replaced again on 03/03/19 due to a small leak, pt had reduction on R breast at this time    Patient Stated Goals  to see if arm is ok, do what she needs to do to get back to exercising so she can do things with her 51 year old    Currently in Pain?  No/denies                       [facility] Adult PT Treatment/Exercise - 08/25/19 0001      Balance Poses: Yoga   Warrior I  1 rep;15 seconds   on each side as  cool down stretch      Exercises   Exercises  Neck;Shoulder;Lumbar;Knee/Hip      Neck Exercises: Seated   Other Seated Exercise  neck and upper thoracic ROM for warm up       Lumbar Exercises: Stretches   Lower Trunk Rotation  5 reps;10 seconds    Pelvic Tilt  5 reps      Lumbar Exercises: Standing   Heel Raises  5 reps    Heel Raises Limitations  on airex.  Pt c/o buring in her feet    Row Limitations  standing on airex with green theraband and PT holding end to provide resistance.  10 reps of bilateral rows and 10 reps of altenating rows       Lumbar Exercises: Seated   Other Seated Lumbar Exercises  sitting on red disc pelvic tilts progressing to cat/cow       Lumbar Exercises: Supine   Bridge  5 reps   2 sets of 5    Bridge with [employer]  5 reps   2 sets of 5      Knee/Hip Exercises: Standing   Other Standing Knee Exercises  standing on one leg holding onto dowel for balance, straight leg hip extension with opposite leg.  5 reps on each leg       Knee/Hip Exercises: Supine   Straight Leg Raises  Strengthening;Right;Left;5 sets   2 sets of 5      Knee/Hip Exercises: Sidelying   Hip ABduction  Right;Left;10 reps    Clams  5 reps with each leg       Shoulder Exercises: Supine   Protraction  Strengthening;Right;Left;20  reps;Weights    Protraction Weight (lbs)  3      Shoulder Exercises: Standing   Flexion  Strengthening;Right;Left;10 reps;Weights   standing on airex   Shoulder Flexion Weight (lbs)  3    ABduction  Strengthening;Right;Left;10 reps;Weights   on airex   Shoulder ABduction Weight (lbs)  3   only 30 degrees of abduction, alternating    Other Standing Exercises  bilateral scaption with 3# ineach hand to about 70 degrees standing on  airex                   PT Long Term Goals - 08/17/19 1624      PT LONG TERM GOAL #1   Title  Pt will have well fiting prophylacitic compression garments for left arm    Time  8    Period  Weeks    Status  On-going      PT LONG TERM GOAL #2   Title  Pt will report the numbness in her left arm is decreased by 50%    Status  Achieved      PT LONG TERM GOAL #3   Title  Pt will report stiffness in both knees is decreased by 50%    Time  8    Period  Weeks    Status  On-going      PT LONG TERM GOAL #4   Title  Pt will be able to continue with an aquatic exercise program upon discontinuation of PT    Time  8    Period  Weeks    Status  On-going            Plan - 08/25/19 1408    Clinical Impression Statement  Pt continues to work hard in PT but shows signs of weakness and fatigue especailly with core and LE exercise.  She is only able to do 5 reps of SLR at a time and was very much challenged by single leg standing hip extension Anticipate she will greatly benefit from the bouancy of the water when doing these exericses in the pool.  Berlynn is trying hard to make positive impact to her health and is trying to find ways to adjust her work schedule to make her days less hectic.  I will reach out to the personal training porgram to see if they are offerring it to employees yet since they stopped it last year due to [place].  Exercise in the building she works would make it much easier for Faige.    Personal Factors and Comorbidities   Profession;Time since onset of injury/illness/exacerbation    Examination-Activity Limitations  Caring for Others;Reach Overhead;Carry;Dressing    Examination-Participation Restrictions  Community Activity;Cleaning    Stability/Clinical Decision Making  Stable/Uncomplicated    Clinical Impairments Affecting Rehab Potential  hx of radiation    PT Treatment/Interventions  ADLs/Self Care Home Management;Therapeutic exercise;Patient/family education;Manual techniques;Manual lymph drainage;Compression bandaging;Scar mobilization;Passive range of motion;Therapeutic activities;Aquatic Therapy;Orthotic Fit/Training    PT Home Exercise Plan  Access Code: [email or web address], supine over foam roll, rockwood for LUE, wall washing, 3 way raises, corner stretch, post op stretches for after surgery of Dr. Sherrine Maples, Strength ABC program 08/17/2019 Access Code: [email or web address] 08/23/2019 hips and core exericse program  Access Code: [email or web address]    Consulted and Agree with Plan of Care  Patient       Patient will benefit from skilled therapeutic intervention in order to improve the following deficits and impairments:  Decreased range of motion, Impaired UE functional use, Decreased strength, Other (comment), Pain, Postural dysfunction  Visit Diagnosis: Aftercare following surgery for neoplasm  Chronic pain of right knee  Chronic pain of left knee  Stiffness of left shoulder, not elsewhere classified  Muscle weakness (generalized)  At risk for lymphedema  Stiffness of knee joint, right  Stiffness of knee joint, left  Acute pain of left shoulder  Cramp and spasm  Other lack of coordination     Problem List Patient Active Problem List  Diagnosis Date Noted  . Benign essential HTN 08/17/2018  . Morbid obesity with body mass index (BMI) of 40.0 to 49.9 ([place]) 09/17/2017  . 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 Malignant neoplasm of upper-inner quadrant of left breast in female, estrogen receptor positive ([place])  07/27/2017  . Genetic testing 03/25/2017  . Malignant 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, estrogen receptor positive ([place]) 01/23/2017  . 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 Owens Shark PT  Norwood Levo 08/25/2019, 2:13 PM  [place], [place], [address] Phone: [phone]   Fax:  [phone]  Name: Mackenzie [name] MRN: [id number] Date of Birth: 1970/07/13

## 2019-08-29 ENCOUNTER — Ambulatory Visit: Payer: 59 | Admitting: Physical Therapy

## 2019-08-31 ENCOUNTER — Ambulatory Visit: Payer: 59 | Admitting: Physical Therapy

## 2019-08-31 ENCOUNTER — Other Ambulatory Visit: Payer: Self-pay

## 2019-08-31 DIAGNOSIS — G8929 Other chronic pain: Secondary | ICD-10-CM

## 2019-08-31 DIAGNOSIS — M6281 Muscle weakness (generalized): Secondary | ICD-10-CM | POA: Diagnosis not present

## 2019-08-31 DIAGNOSIS — M25661 Stiffness of right knee, not elsewhere classified: Secondary | ICD-10-CM | POA: Diagnosis not present

## 2019-08-31 DIAGNOSIS — M25662 Stiffness of left knee, not elsewhere classified: Secondary | ICD-10-CM

## 2019-08-31 DIAGNOSIS — M25561 Pain in right knee: Secondary | ICD-10-CM

## 2019-08-31 DIAGNOSIS — Z9189 Other specified personal risk factors, not elsewhere classified: Secondary | ICD-10-CM | POA: Diagnosis not present

## 2019-08-31 DIAGNOSIS — M25612 Stiffness of left shoulder, not elsewhere classified: Secondary | ICD-10-CM | POA: Diagnosis not present

## 2019-08-31 DIAGNOSIS — M25562 Pain in left knee: Secondary | ICD-10-CM | POA: Diagnosis not present

## 2019-08-31 DIAGNOSIS — Z483 Aftercare following surgery for neoplasm: Secondary | ICD-10-CM

## 2019-08-31 NOTE — Therapy (Signed)
[place], [place], [address] Phone: [phone]   Fax:  [phone]  Physical Therapy Treatment  Patient Details  Name: Mackenzie [name] MRN: [id number] Date of Birth: 09-28-1970 Referring Provider (PT): [name]    Encounter Date: 08/31/2019  PT End of Session - 08/31/19 1629    Visit Number  7    Number of Visits  17    Date for PT Re-Evaluation  10/10/19    PT Start Time  [id number]   pt arrived late   PT Stop Time  1450    PT Time Calculation (min)  35 min    Activity Tolerance  Patient tolerated treatment well    Behavior During Therapy  [facility] for tasks assessed/performed       Past Medical History:  Diagnosis Date  . Abnormal glucose 2018  . 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 Allergic rhinitis 2012  . Anemia 01/27/2017   prior to starting chemotherapy  . Breast cancer ([place]) 01/14/2017   Left breast  . 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 Edema, lower extremity   . Fatty liver   . Hot flashes 03/2017  . Hyperlipidemia 03/20/2016  . Morbid obesity with body mass index (BMI) of 40.0 to 49.9 ([place]) 09/17/2017  . NCGS (non-celiac gluten sensitivity)   . Pre-diabetes   . Seasonal allergies 2012   seasonal allergies causes allergic rhinitis and itchy, dry eyes per pt  . Vitamin D deficiency 05/2015    Past Surgical History:  Procedure Laterality Date  . BREAST RECONSTRUCTION WITH PLACEMENT OF TISSUE EXPANDER AND FLEX HD (ACELLULAR HYDRATED DERMIS) Left 08/27/2017   Procedure: LEFT BREAST RECONSTRUCTION WITH PLACEMENT OF TISSUE EXPANDER AND FLEX HD;  Surgeon: Wallace Going, DO;  Location: [place];  Service: Plastics;  Laterality: Left;  . CESAREAN SECTION     x2  . LATISSIMUS FLAP TO BREAST Left 11/03/2018  . LATISSIMUS FLAP TO BREAST Left 11/03/2018   Procedure: LATISSIMUS FLAP TO LEFT BREAST;  Surgeon: Wallace Going, DO;   Location: [place];  Service: Plastics;  Laterality: Left;  Marland Kitchen MASTECTOMY MODIFIED RADICAL Left 08/27/2017  . MASTECTOMY MODIFIED RADICAL Left 08/27/2017   Procedure: LEFT MODIFIED RADICAL MASTECTOMY;  Surgeon: Erroll Luna, MD;  Location: [place];  Service: General;  Laterality: Left;  Marland Kitchen MASTOPEXY Right 03/03/2019   Procedure: RIGHT BREAST MASTOPEXY/REDUCTION;  Surgeon: Wallace Going, DO;  Location: [place];  Service: Plastics;  Laterality: Right;  3 hours, please  . PORT-A-CATH REMOVAL Right 08/27/2017   Procedure: REMOVAL PORT-A-CATH RIGHT CHEST;  Surgeon: Erroll Luna, MD;  Location: [place];  Service: General;  Laterality: Right;  . PORTACATH PLACEMENT Right 01/28/2017   Procedure: INSERTION PORT-A-CATH WITH ULTRASOUND;  Surgeon: Erroll Luna, MD;  Location: [place];  Service: General;  Laterality: Right;  . REMOVAL OF TISSUE EXPANDER AND PLACEMENT OF IMPLANT Left 03/03/2019   Procedure: REMOVAL OF TISSUE EXPANDER AND PLACEMENT OF EXPANDER;  Surgeon: Wallace Going, DO;  Location: [place];  Service: Plastics;  Laterality: Left;  . REMOVAL OF TISSUE EXPANDER AND PLACEMENT OF IMPLANT Left 05/25/2019   Procedure: LEFT BREAST REMOVAL OF TISSUE EXPANDER AND PLACEMENT OF IMPLANT;  Surgeon: Wallace Going, DO;  Location: [place];  Service: Plastics;  Laterality: Left;  . TISSUE EXPANDER PLACEMENT Left 11/03/2018   Procedure: PLACEMENT OF TISSUE EXPANDER LEFT BREAST;  Surgeon: Wallace Going, DO;  Location: [place];  Service: Plastics;  Laterality: Left;  Total case time is 3.5 hours  . TUBAL LIGATION Bilateral 01/21/2011    There were no vitals filed for this visit.  Subjective Assessment - 08/31/19 1320    Subjective  Pt states she is doing well.    Pertinent History  Left breast cancer diagnosed 01/10/17 by mammogram and needle biopsy. Then had neo-adjuvant chemo starting 02/10/17 until 06/26/17; had left  mastectomy 08/27/17 with ALND and immediate expander placed. Radiation completed. Expander removed and replaced on May 27, 19 b/c it did not get expanded enough prior to radiation, also had lat flap 11/02/17. Had expander removed and replaced again on 03/03/19 due to a small leak, pt had reduction on R breast at this time    Patient Stated Goals  to see if arm is ok, do what she needs to do to get back to exercising so she can do things with her 51 year old    Currently in Pain?  No/denies                       [facility] Adult PT Treatment/Exercise - 08/31/19 0001      Exercises   Exercises  Shoulder;Lumbar;Knee/Hip      Lumbar Exercises: Aerobic   Stationary Bike  5 minutes at level 1, RPE 6/10       Lumbar Exercises: Seated   Other Seated Lumbar Exercises  pelvic tilts andt/posterior /lateral on red disc     Other Seated Lumbar Exercises  dowel stretches into shoulder flexion , abduction and rotation on red disc      Lumbar Exercises: Supine   Pelvic Tilt  --    Pelvic Tilt Limitations  --    Clam  10 reps    Bridge  10 reps    Other Supine Lumbar Exercises  leg stretch out long       Knee/Hip Exercises: Seated   Other Seated Knee/Hip Exercises  feet press into floor 5 reps with each leg       Knee/Hip Exercises: Supine   Straight Leg Raises  Strengthening;Right;Left;10 reps      Knee/Hip Exercises: Sidelying   Hip ABduction  Right;Left;10 reps    Clams  10 reps     Other Sidelying Knee/Hip Exercises  5 reps knee to chest with knee and foot in align       Knee/Hip Exercises: Prone   Hamstring Curl  1 set;10 reps    Hamstring Curl Limitations  manual overpressure into more flexion by PT     Hip Extension  AROM;Both    Straight Leg Raises  AROM;5 reps      Shoulder Exercises: Prone   Retraction  AROM;Right;Left;5 reps    Extension  Strengthening;Left;10 reps    External Rotation  AROM;Left;5 reps    Horizontal ABduction 1  AROM;Left;5 reps      Shoulder  Exercises: Standing   Other Standing Exercises  dowel rod stretches for flexion, abduction,                   PT Long Term Goals - 08/17/19 1624      PT LONG TERM GOAL #1   Title  Pt will have well fiting prophylacitic compression garments for left arm    Time  8    Period  Weeks    Status  On-going      PT LONG TERM GOAL #  2   Title  Pt will report the numbness in her left arm is decreased by 50%    Status  Achieved      PT LONG TERM GOAL #3   Title  Pt will report stiffness in both knees is decreased by 50%    Time  8    Period  Weeks    Status  On-going      PT LONG TERM GOAL #4   Title  Pt will be able to continue with an aquatic exercise program upon discontinuation of PT    Time  8    Period  Weeks    Status  On-going            Plan - 08/31/19 1630    Clinical Impression Statement  Pt is making gains in strength as exercises appear easier for her and she is able to increase in repetitions.  Pt is planning to do aquatic therapy and will continue with personal training at hospital gym when that is complete    Personal Factors and Comorbidities  Profession;Time since onset of injury/illness/exacerbation    Examination-Activity Limitations  Caring for Others;Reach Overhead;Carry;Dressing    Stability/Clinical Decision Making  Stable/Uncomplicated    Clinical Impairments Affecting Rehab Potential  hx of radiation    PT Frequency  2x / week    PT Duration  8 weeks    PT Treatment/Interventions  ADLs/Self Care Home Management;Therapeutic exercise;Patient/family education;Manual techniques;Manual lymph drainage;Compression bandaging;Scar mobilization;Passive range of motion;Therapeutic activities;Aquatic Therapy;Orthotic Fit/Training    PT Next Visit Plan  continue with core strength and mobilyt as well as LE strength to decrease knee pain and improve balance. check on hospital personal training with Fatima Blank.    PT Home Exercise Plan  Access Code: [email or web address],  supine over foam roll, rockwood for LUE, wall washing, 3 way raises, corner stretch, post op stretches for after surgery of Dr. Sherrine Maples, Strength ABC program 08/17/2019 Access Code: [email or web address] 08/23/2019 hips and core exericse program  Access Code: [email or web address]    Consulted and Agree with Plan of Care  Patient       Patient will benefit from skilled therapeutic intervention in order to improve the following deficits and impairments:  Decreased range of motion, Impaired UE functional use, Decreased strength, Other (comment), Pain, Postural dysfunction  Visit Diagnosis: Aftercare following surgery for neoplasm  Chronic pain of right knee  Chronic pain of left knee  Stiffness of left shoulder, not elsewhere classified  Muscle weakness (generalized)  Stiffness of knee joint, right  Stiffness of knee joint, left     Problem List Patient Active Problem List   Diagnosis Date Noted  . Benign essential HTN 08/17/2018  . Morbid obesity with body mass index (BMI) of 40.0 to 49.9 ([place]) 09/17/2017  . 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 Malignant neoplasm of upper-inner quadrant of left breast in female, estrogen receptor positive ([place]) 07/27/2017  . Genetic testing 03/25/2017  . Malignant 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, estrogen receptor positive ([place]) 01/23/2017  . 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 Owens Shark PT  Norwood Levo 08/31/2019, 4:34 PM  [place], [place], [address] Phone: [phone]   Fax:  [phone]  Name: Mackenzie [name] MRN: [id number] Date of Birth: 25-Sep-1970

## 2019-09-06 ENCOUNTER — Ambulatory Visit: Payer: 59 | Admitting: Physical Therapy

## 2019-09-07 ENCOUNTER — Ambulatory Visit (INDEPENDENT_AMBULATORY_CARE_PROVIDER_SITE_OTHER): Payer: 59 | Admitting: Physician Assistant

## 2019-09-07 ENCOUNTER — Ambulatory Visit: Payer: Self-pay | Admitting: Physical Therapy

## 2019-09-14 ENCOUNTER — Ambulatory Visit: Payer: 59 | Attending: Surgical | Admitting: Physical Therapy

## 2019-09-14 ENCOUNTER — Other Ambulatory Visit: Payer: Self-pay

## 2019-09-14 DIAGNOSIS — Z9189 Other specified personal risk factors, not elsewhere classified: Secondary | ICD-10-CM | POA: Insufficient documentation

## 2019-09-14 DIAGNOSIS — R252 Cramp and spasm: Secondary | ICD-10-CM | POA: Diagnosis present

## 2019-09-14 DIAGNOSIS — M25612 Stiffness of left shoulder, not elsewhere classified: Secondary | ICD-10-CM | POA: Insufficient documentation

## 2019-09-14 DIAGNOSIS — M6281 Muscle weakness (generalized): Secondary | ICD-10-CM | POA: Diagnosis not present

## 2019-09-14 DIAGNOSIS — Z483 Aftercare following surgery for neoplasm: Secondary | ICD-10-CM | POA: Diagnosis not present

## 2019-09-14 DIAGNOSIS — R278 Other lack of coordination: Secondary | ICD-10-CM | POA: Diagnosis present

## 2019-09-14 DIAGNOSIS — G8929 Other chronic pain: Secondary | ICD-10-CM | POA: Diagnosis not present

## 2019-09-14 DIAGNOSIS — M25512 Pain in left shoulder: Secondary | ICD-10-CM | POA: Insufficient documentation

## 2019-09-14 DIAGNOSIS — M25562 Pain in left knee: Secondary | ICD-10-CM | POA: Diagnosis not present

## 2019-09-14 DIAGNOSIS — M25662 Stiffness of left knee, not elsewhere classified: Secondary | ICD-10-CM | POA: Insufficient documentation

## 2019-09-14 DIAGNOSIS — M25561 Pain in right knee: Secondary | ICD-10-CM | POA: Insufficient documentation

## 2019-09-14 DIAGNOSIS — M25661 Stiffness of right knee, not elsewhere classified: Secondary | ICD-10-CM | POA: Insufficient documentation

## 2019-09-15 ENCOUNTER — Encounter: Payer: Self-pay | Admitting: Physical Therapy

## 2019-09-15 ENCOUNTER — Ambulatory Visit (INDEPENDENT_AMBULATORY_CARE_PROVIDER_SITE_OTHER): Payer: 59 | Admitting: Physician Assistant

## 2019-09-15 ENCOUNTER — Encounter (INDEPENDENT_AMBULATORY_CARE_PROVIDER_SITE_OTHER): Payer: Self-pay | Admitting: Physician Assistant

## 2019-09-15 VITALS — BP 126/78 | HR 88 | Temp 98.0°F | Ht 65.0 in | Wt 266.0 lb

## 2019-09-15 DIAGNOSIS — Z6841 Body Mass Index (BMI) 40.0 and over, adult: Secondary | ICD-10-CM | POA: Diagnosis not present

## 2019-09-15 DIAGNOSIS — R7303 Prediabetes: Secondary | ICD-10-CM | POA: Diagnosis not present

## 2019-09-15 DIAGNOSIS — E7849 Other hyperlipidemia: Secondary | ICD-10-CM | POA: Diagnosis not present

## 2019-09-15 DIAGNOSIS — Z9189 Other specified personal risk factors, not elsewhere classified: Secondary | ICD-10-CM | POA: Diagnosis not present

## 2019-09-15 MED ORDER — METFORMIN HCL 500 MG PO TABS: 500.0000 mg | ORAL_TABLET | Freq: Every morning | ORAL | 0 refills | Status: DC

## 2019-09-15 NOTE — Therapy (Signed)
[facility] [address] [place], [place], [address] Phone: [phone]   Fax:  [phone]  Physical Therapy Treatment  Patient Details  Name: Mackenzie [name] MRN: [id number] Date of Birth: 1971-02-02 Referring Provider (PT): [name]    Encounter Date: 09/14/2019  PT End of Session - 09/15/19 1135    Visit Number  8    Number of Visits  17    Date for PT Re-Evaluation  10/10/19    PT Start Time  1500    PT Stop Time  1545    PT Time Calculation (min)  45 min    Activity Tolerance  Patient tolerated treatment well    Behavior During Therapy  [facility] for tasks assessed/performed       Past Medical History:  Diagnosis Date  . Abnormal glucose 2018  . 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 Allergic rhinitis 2012  . Anemia 01/27/2017   prior to starting chemotherapy  . Breast cancer ([place]) 01/14/2017   Left breast  . 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 Edema, lower extremity   . Fatty liver   . Hot flashes 03/2017  . Hyperlipidemia 03/20/2016  . Morbid obesity with body mass index (BMI) of 40.0 to 49.9 ([place]) 09/17/2017  . NCGS (non-celiac gluten sensitivity)   . Pre-diabetes   . Seasonal allergies 2012   seasonal allergies causes allergic rhinitis and itchy, dry eyes per pt  . Vitamin D deficiency 05/2015    Past Surgical History:  Procedure Laterality Date  . BREAST RECONSTRUCTION WITH PLACEMENT OF TISSUE EXPANDER AND FLEX HD (ACELLULAR HYDRATED DERMIS) Left 08/27/2017   Procedure: LEFT BREAST RECONSTRUCTION WITH PLACEMENT OF TISSUE EXPANDER AND FLEX HD;  Surgeon: Wallace Going, DO;  Location: [place];  Service: Plastics;  Laterality: Left;  . CESAREAN SECTION     x2  . LATISSIMUS FLAP TO BREAST Left 11/03/2018  . LATISSIMUS FLAP TO BREAST Left 11/03/2018   Procedure: LATISSIMUS FLAP TO LEFT BREAST;  Surgeon: Wallace Going, DO;  Location: [place];   Service: Plastics;  Laterality: Left;  Marland Kitchen MASTECTOMY MODIFIED RADICAL Left 08/27/2017  . MASTECTOMY MODIFIED RADICAL Left 08/27/2017   Procedure: LEFT MODIFIED RADICAL MASTECTOMY;  Surgeon: Erroll Luna, MD;  Location: [name];  Service: General;  Laterality: Left;  Marland Kitchen MASTOPEXY Right 03/03/2019   Procedure: RIGHT BREAST MASTOPEXY/REDUCTION;  Surgeon: Wallace Going, DO;  Location: [place];  Service: Plastics;  Laterality: Right;  3 hours, please  . PORT-A-CATH REMOVAL Right 08/27/2017   Procedure: REMOVAL PORT-A-CATH RIGHT CHEST;  Surgeon: Erroll Luna, MD;  Location: [place];  Service: General;  Laterality: Right;  . PORTACATH PLACEMENT Right 01/28/2017   Procedure: INSERTION PORT-A-CATH WITH ULTRASOUND;  Surgeon: Erroll Luna, MD;  Location: [place];  Service: General;  Laterality: Right;  . REMOVAL OF TISSUE EXPANDER AND PLACEMENT OF IMPLANT Left 03/03/2019   Procedure: REMOVAL OF TISSUE EXPANDER AND PLACEMENT OF EXPANDER;  Surgeon: Wallace Going, DO;  Location: [place];  Service: Plastics;  Laterality: Left;  . REMOVAL OF TISSUE EXPANDER AND PLACEMENT OF IMPLANT Left 05/25/2019   Procedure: LEFT BREAST REMOVAL OF TISSUE EXPANDER AND PLACEMENT OF IMPLANT;  Surgeon: Wallace Going, DO;  Location: [place];  Service: Plastics;  Laterality: Left;  . TISSUE EXPANDER PLACEMENT Left 11/03/2018   Procedure: PLACEMENT OF TISSUE EXPANDER LEFT BREAST;  Surgeon: Audelia Hives  S, DO;  Location: [place];  Service: Plastics;  Laterality: Left;  Total case time is 3.5 hours  . TUBAL LIGATION Bilateral 01/21/2011    There were no vitals filed for this visit.  Subjective Assessment - 09/15/19 1133    Subjective  Pt denies any changes. Wants to improve strength and balance.    Pertinent History  Left breast cancer diagnosed 01/10/17 by mammogram and needle biopsy. Then had neo-adjuvant chemo starting 02/10/17 until  06/26/17; had left mastectomy 08/27/17 with ALND and immediate expander placed. Radiation completed. Expander removed and replaced on May 27, 19 b/c it did not get expanded enough prior to radiation, also had lat flap 11/02/17. Had expander removed and replaced again on 03/03/19 due to a small leak, pt had reduction on R breast at this time    Patient Stated Goals  to see if arm is ok, do what she needs to do to get back to exercising so she can do things with her 51 year old    Currently in Pain?  No/denies       Aquatic therapy at [facility] - pool temp 86.7degrees  Patient seen for aquatic therapy today. Treatment took place in water 3.5-4 feet deep depending upon activity. Pt entered and exited the pool via step negotiation step by step sequence with use of bil. hand rails with supervision:   Runners stretch bil LE's and toes/feet up edge of pool x 30 seconds each bil LE's.  Pt performed gait training in pool forwards 50m x 2 repswithout UE support, 67m x 2 working on bil arm swing then 66m x 2 repsbackwards. Performed side stepping 40m x 2 then side stepping squatsx 7m x 2.Cues for technique/sequence.  Standing with UE support of pool edge for bil LE marching, hip extension, hip abd, hamstring curl. Pt able to perform 10 reps each side.   Squats while standing at pool edge for UE support x 10 reps.   Seated on pool bench for Ai Chi posture of soothing and enclosing x 15 reps each. Performed marching x 10 bil and LAQ x 10 bil.  Pt needing UE assist to keep from floating off pool bench.  Forward<>backward step weight shifting without UE support.  Pt requires buoyancy for support with balance and viscosity for resistance for strengthening exercises; buoyancy is also needed for off loading body to assist with exercises.    PT Long Term Goals - 08/17/19 1624      PT LONG TERM GOAL #1   Title  Pt will have well fiting prophylacitic compression garments for left arm    Time  8     Period  Weeks    Status  On-going      PT LONG TERM GOAL #2   Title  Pt will report the numbness in her left arm is decreased by 50%    Status  Achieved      PT LONG TERM GOAL #3   Title  Pt will report stiffness in both knees is decreased by 50%    Time  8    Period  Weeks    Status  On-going      PT LONG TERM GOAL #4   Title  Pt will be able to continue with an aquatic exercise program upon discontinuation of PT    Time  8    Period  Weeks    Status  On-going            Plan - 09/15/19  1135    Clinical Impression Statement  Pt tolerated first aquatic session well working on strength, balance and flexibility.  Pt motivated and is planning to continue aquatics on her own after d/c from PT sessions.  Continue PT per POC.    Personal Factors and Comorbidities  Profession;Time since onset of injury/illness/exacerbation    Examination-Activity Limitations  Caring for Others;Reach Overhead;Carry;Dressing    Stability/Clinical Decision Making  Stable/Uncomplicated    Clinical Impairments Affecting Rehab Potential  hx of radiation    PT Frequency  2x / week    PT Duration  8 weeks    PT Treatment/Interventions  ADLs/Self Care Home Management;Therapeutic exercise;Patient/family education;Manual techniques;Manual lymph drainage;Compression bandaging;Scar mobilization;Passive range of motion;Therapeutic activities;Aquatic Therapy;Orthotic Fit/Training    PT Next Visit Plan  continue with core strength and mobilyt as well as LE strength to decrease knee pain and improve balance. check on hospital personal training with Fatima Blank.    PT Home Exercise Plan  Access Code: [email or web address], supine over foam roll, rockwood for LUE, wall washing, 3 way raises, corner stretch, post op stretches for after surgery of Dr. Sherrine Maples, Strength ABC program 08/17/2019 Access Code: [email or web address] 08/23/2019 hips and core exericse program  Access Code: [email or web address]    Consulted and Agree with Plan of Care  Patient        Patient will benefit from skilled therapeutic intervention in order to improve the following deficits and impairments:  Decreased range of motion, Impaired UE functional use, Decreased strength, Other (comment), Pain, Postural dysfunction  Visit Diagnosis: Stiffness of left shoulder, not elsewhere classified  Muscle weakness (generalized)     Problem List Patient Active Problem List   Diagnosis Date Noted  . Benign essential HTN 08/17/2018  . Morbid obesity with body mass index (BMI) of 40.0 to 49.9 ([place]) 09/17/2017  . 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 Malignant neoplasm of upper-inner quadrant of left breast in female, estrogen receptor positive ([place]) 07/27/2017  . Genetic testing 03/25/2017  . Malignant 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, estrogen receptor positive ([place]) 01/23/2017  . 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 [place], [place], [address] Phone: [phone]   Fax:  [phone]  Name: CHONDA RENGIFO MRN: [id number] Date of Birth: [birth year]

## 2019-09-16 ENCOUNTER — Other Ambulatory Visit: Payer: Self-pay

## 2019-09-16 ENCOUNTER — Ambulatory Visit: Payer: 59

## 2019-09-16 DIAGNOSIS — G8929 Other chronic pain: Secondary | ICD-10-CM | POA: Diagnosis not present

## 2019-09-16 DIAGNOSIS — Z483 Aftercare following surgery for neoplasm: Secondary | ICD-10-CM | POA: Diagnosis not present

## 2019-09-16 DIAGNOSIS — M25561 Pain in right knee: Secondary | ICD-10-CM | POA: Diagnosis not present

## 2019-09-16 DIAGNOSIS — M25661 Stiffness of right knee, not elsewhere classified: Secondary | ICD-10-CM | POA: Diagnosis not present

## 2019-09-16 DIAGNOSIS — M25612 Stiffness of left shoulder, not elsewhere classified: Secondary | ICD-10-CM | POA: Diagnosis not present

## 2019-09-16 DIAGNOSIS — M6281 Muscle weakness (generalized): Secondary | ICD-10-CM

## 2019-09-16 DIAGNOSIS — R278 Other lack of coordination: Secondary | ICD-10-CM

## 2019-09-16 DIAGNOSIS — M25512 Pain in left shoulder: Secondary | ICD-10-CM

## 2019-09-16 DIAGNOSIS — Z9189 Other specified personal risk factors, not elsewhere classified: Secondary | ICD-10-CM

## 2019-09-16 DIAGNOSIS — M25662 Stiffness of left knee, not elsewhere classified: Secondary | ICD-10-CM

## 2019-09-16 DIAGNOSIS — M25562 Pain in left knee: Secondary | ICD-10-CM | POA: Diagnosis not present

## 2019-09-16 DIAGNOSIS — R252 Cramp and spasm: Secondary | ICD-10-CM

## 2019-09-16 MED FILL — metFORMIN HCL 500 MG TABS: 500 | 30 days supply | Qty: 30 | Fill #0

## 2019-09-16 NOTE — Therapy (Signed)
[place], [place], [address] Phone: [phone]   Fax:  [phone]  Physical Therapy Treatment  Patient Details  Name: Mackenzie [name] MRN: [id number] Date of Birth: August 04, 1970 Referring Provider (PT): [name]    Encounter Date: 09/16/2019  PT End of Session - 09/16/19 0804    Visit Number  9    Number of Visits  17    Date for PT Re-Evaluation  10/10/19    PT Start Time  0804    PT Stop Time  0900    PT Time Calculation (min)  56 min    Activity Tolerance  Patient tolerated treatment well    Behavior During Therapy  [facility] for tasks assessed/performed       Past Medical History:  Diagnosis Date  . Abnormal glucose 2018  . 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 Allergic rhinitis 2012  . Anemia 01/27/2017   prior to starting chemotherapy  . Breast cancer ([place]) 01/14/2017   Left breast  . 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 Edema, lower extremity   . Fatty liver   . Hot flashes 03/2017  . Hyperlipidemia 03/20/2016  . Morbid obesity with body mass index (BMI) of 40.0 to 49.9 ([place]) 09/17/2017  . NCGS (non-celiac gluten sensitivity)   . Pre-diabetes   . Seasonal allergies 2012   seasonal allergies causes allergic rhinitis and itchy, dry eyes per pt  . Vitamin D deficiency 05/2015    Past Surgical History:  Procedure Laterality Date  . BREAST RECONSTRUCTION WITH PLACEMENT OF TISSUE EXPANDER AND FLEX HD (ACELLULAR HYDRATED DERMIS) Left 08/27/2017   Procedure: LEFT BREAST RECONSTRUCTION WITH PLACEMENT OF TISSUE EXPANDER AND FLEX HD;  Surgeon: Wallace Going, DO;  Location: [place];  Service: Plastics;  Laterality: Left;  . CESAREAN SECTION     x2  . LATISSIMUS FLAP TO BREAST Left 11/03/2018  . LATISSIMUS FLAP TO BREAST Left 11/03/2018   Procedure: LATISSIMUS FLAP TO LEFT BREAST;  Surgeon: Wallace Going, DO;  Location: [place];   Service: Plastics;  Laterality: Left;  Marland Kitchen MASTECTOMY MODIFIED RADICAL Left 08/27/2017  . MASTECTOMY MODIFIED RADICAL Left 08/27/2017   Procedure: LEFT MODIFIED RADICAL MASTECTOMY;  Surgeon: Erroll Luna, MD;  Location: [facility] [place];  Service: General;  Laterality: Left;  Marland Kitchen MASTOPEXY Right 03/03/2019   Procedure: RIGHT BREAST MASTOPEXY/REDUCTION;  Surgeon: Wallace Going, DO;  Location: [place];  Service: Plastics;  Laterality: Right;  3 hours, please  . PORT-A-CATH REMOVAL Right 08/27/2017   Procedure: REMOVAL PORT-A-CATH RIGHT CHEST;  Surgeon: Erroll Luna, MD;  Location: [facility];  Service: General;  Laterality: Right;  . PORTACATH PLACEMENT Right 01/28/2017   Procedure: INSERTION PORT-A-CATH WITH ULTRASOUND;  Surgeon: Erroll Luna, MD;  Location: [facility];  Service: General;  Laterality: Right;  . REMOVAL OF TISSUE EXPANDER AND PLACEMENT OF IMPLANT Left 03/03/2019   Procedure: REMOVAL OF TISSUE EXPANDER AND PLACEMENT OF EXPANDER;  Surgeon: Wallace Going, DO;  Location: [facility];  Service: Plastics;  Laterality: Left;  . REMOVAL OF TISSUE EXPANDER AND PLACEMENT OF IMPLANT Left 05/25/2019   Procedure: LEFT BREAST REMOVAL OF TISSUE EXPANDER AND PLACEMENT OF IMPLANT;  Surgeon: Wallace Going, DO;  Location: [facility];  Service: Plastics;  Laterality: Left;  . TISSUE EXPANDER PLACEMENT Left 11/03/2018   Procedure: PLACEMENT OF TISSUE EXPANDER LEFT BREAST;  Surgeon: Wallace Going,  DO;  Location: [place];  Service: Plastics;  Laterality: Left;  Total case time is 3.5 hours  . TUBAL LIGATION Bilateral 01/21/2011    There were no vitals filed for this visit.  Subjective Assessment - 09/16/19 0804    Subjective  Pt states that she spoke with Sharee Pimple personal trainer at [facility] and was told they do not have the staff at this time to take her. She states that she went to the pool physical therapy for the first time this week  and got a really good workout.    Pertinent History  Left breast cancer diagnosed 01/10/17 by mammogram and needle biopsy. Then had neo-adjuvant chemo starting 02/10/17 until 06/26/17; had left mastectomy 08/27/17 with ALND and immediate expander placed. Radiation completed. Expander removed and replaced on May 27, 19 b/c it did not get expanded enough prior to radiation, also had lat flap 11/02/17. Had expander removed and replaced again on 03/03/19 due to a small leak, pt had reduction on R breast at this time    Patient Stated Goals  to see if arm is ok, do what she needs to do to get back to exercising so she can do things with her 51 year old    Currently in Pain?  Yes    Pain Score  2     Pain Location  Knee    Pain Orientation  Right;Left    Pain Descriptors / Indicators  Aching    Pain Type  Chronic pain    Pain Onset  More than a month ago    Pain Frequency  Constant                       [facility] Adult PT Treatment/Exercise - 09/16/19 0001      Knee/Hip Exercises: Supine   Bridges  Strengthening;Both;2 sets    Bridges Limitations  18 x 2 sets good form throughout    Straight Leg Raises  Strengthening;1 set;Right;Left    Straight Leg Raises Limitations  18x with VC for pelvic tilt throughout movement, toes to the nose and going no higher than the knee to decrease stress on the low back.     Straight Leg Raise with External Rotation  Strengthening;Right;Left;1 set    Straight Leg Raise with External Rotation Limitations  15x due to less strength as demonstrated by decreased control, in the adducturs and medial quad, VC for correct form and abdominal engagement       Knee/Hip Exercises: Sidelying   Hip ABduction  Strengthening;Right;Left;1 set    Hip ABduction Limitations  18x with VC for correct alignment and tactile cueing to prevent posterior rotation of the pelvis and compensation with quad    Clams  18 right after hip abd in order to address weakness with tactile cueing to  prevent compensation w/rotation posteriorly of the pelvis.       Knee/Hip Exercises: Prone   Hip Extension  Strengthening;Both;1 set    Hip Extension Limitations  Keith Rake with hip extension, shoulder extension w/VC for contracting shoulder blades down and back 10x10 seconds    Other Prone Exercises  2x 10 Bil hip extension with knee bent to target glute max with VC and tactile cueing to prevent rotation of the pelvis off the table on the working side due to weakness and compensation due to pt is very tight in the quad and has significant weakness in the Bil glutes.     Other Prone Exercises  Prone quad  stretch due to significant tightness noted in Bil quads educated pt on how tight quads can result in pain at the knees due to compression VC, tactile cueing and set-up for correct performance and adequate quad stretch.       Shoulder Exercises: Prone   Retraction  Strengthening;Both;20 reps    Retraction Limitations  Retraction w/shoulder extension VC for keeping shoulders down/back              PT Education - 09/16/19 0842    Education Details  Educated pt on working on stretching Bil quads in order to take stress off the Bil patella due to possible compression. She was educated on the importance of core activation with hip/pelvic strengthening in order to prevent compensation down the chain of movement.    Person(s) Educated  Patient    Methods  Explanation;Demonstration;Tactile cues;Verbal cues    Comprehension  Verbalized understanding;Returned demonstration          PT Long Term Goals - 08/17/19 1624      PT LONG TERM GOAL #1   Title  Pt will have well fiting prophylacitic compression garments for left arm    Time  8    Period  Weeks    Status  On-going      PT LONG TERM GOAL #2   Title  Pt will report the numbness in her left arm is decreased by 50%    Status  Achieved      PT LONG TERM GOAL #3   Title  Pt will report stiffness in both knees is decreased by 50%     Time  8    Period  Weeks    Status  On-going      PT LONG TERM GOAL #4   Title  Pt will be able to continue with an aquatic exercise program upon discontinuation of PT    Time  8    Period  Weeks    Status  On-going            Plan - 09/16/19 0803    Clinical Impression Statement  Pt presents to physical therapy today with continued pain in her Bil knees. She was able to tolerate endurance level of repetitions this session with core/hip exercises requiring tactile/VC to prevent compensation. R hip demonstrates greater weakness than L and L shoulder greater weakness than the R.  Pt has significant tightness noted in Bil quads and was shown how to perform a quad stretch; this could be contributing to pain in bil knees. STM with foam roll was performed to Bil quads with significant tight areas noted to help decreased compression on the patella; minimal improvement following foam rolling STM. Pt will benefit from continued POC at this time.    Personal Factors and Comorbidities  Profession;Time since onset of injury/illness/exacerbation    Examination-Activity Limitations  Caring for Others;Reach Overhead;Carry;Dressing    Examination-Participation Restrictions  Community Activity;Cleaning    Rehab Potential  Good    Clinical Impairments Affecting Rehab Potential  hx of radiation    PT Frequency  2x / week    PT Duration  8 weeks    PT Treatment/Interventions  ADLs/Self Care Home Management;Therapeutic exercise;Patient/family education;Manual techniques;Manual lymph drainage;Compression bandaging;Scar mobilization;Passive range of motion;Therapeutic activities;Aquatic Therapy;Orthotic Fit/Training    PT Next Visit Plan  continue with core strength and mobilyt as well as LE strength to decrease knee pain and improve balance.    PT Home Exercise Plan  Access Code: [email or web address], supine over  foam roll, rockwood for LUE, wall washing, 3 way raises, corner stretch, post op stretches for after surgery  of Dr. Sherrine Maples, Strength ABC program 08/17/2019 Access Code: [email or web address] 08/23/2019 hips and core exericse program  Access Code: [email or web address]    Consulted and Agree with Plan of Care  Patient       Patient will benefit from skilled therapeutic intervention in order to improve the following deficits and impairments:  Decreased range of motion, Impaired UE functional use, Decreased strength, Other (comment), Pain, Postural dysfunction  Visit Diagnosis: Stiffness of left shoulder, not elsewhere classified  Muscle weakness (generalized)  Aftercare following surgery for neoplasm  Chronic pain of right knee  Chronic pain of left knee  Stiffness of knee joint, right  Stiffness of knee joint, left  At risk for lymphedema  Acute pain of left shoulder  Cramp and spasm  Other lack of coordination     Problem List Patient Active Problem List   Diagnosis Date Noted  . Benign essential HTN 08/17/2018  . Morbid obesity with body mass index (BMI) of 40.0 to 49.9 ([place]) 09/17/2017  . 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 Malignant neoplasm of upper-inner quadrant of left breast in female, estrogen receptor positive ([place]) 07/27/2017  . Genetic testing 03/25/2017  . Malignant 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, estrogen receptor positive ([place]) 01/23/2017  . 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

## 2019-09-19 ENCOUNTER — Inpatient Hospital Stay: Payer: 59 | Attending: Adult Health

## 2019-09-19 ENCOUNTER — Ambulatory Visit: Payer: 59 | Admitting: Oncology

## 2019-09-19 ENCOUNTER — Other Ambulatory Visit: Payer: 59

## 2019-09-19 ENCOUNTER — Other Ambulatory Visit: Payer: Self-pay

## 2019-09-19 VITALS — BP 122/68 | HR 78 | Temp 98.2°F | Resp 18

## 2019-09-19 DIAGNOSIS — C50812 Malignant neoplasm of overlapping sites of left female breast: Secondary | ICD-10-CM | POA: Insufficient documentation

## 2019-09-19 DIAGNOSIS — Z006 Encounter for examination for normal comparison and control in clinical research program: Secondary | ICD-10-CM | POA: Diagnosis not present

## 2019-09-19 DIAGNOSIS — Z5111 Encounter for antineoplastic chemotherapy: Secondary | ICD-10-CM | POA: Diagnosis not present

## 2019-09-19 DIAGNOSIS — Z170 Estrogen receptor positive status [ER+]: Secondary | ICD-10-CM | POA: Insufficient documentation

## 2019-09-19 MED ORDER — GOSERELIN ACETATE 3.6 MG ~~LOC~~ IMPL
DRUG_IMPLANT | SUBCUTANEOUS | Status: AC
  Filled 2019-09-19: qty 3.6

## 2019-09-19 MED ORDER — GOSERELIN ACETATE 3.6 MG ~~LOC~~ IMPL
3.6000 mg | DRUG_IMPLANT | Freq: Once | SUBCUTANEOUS | Status: AC
  Administered 2019-09-19: 3.6 mg via SUBCUTANEOUS

## 2019-09-19 NOTE — Progress Notes (Signed)
Chief Complaint:   OBESITY Mackenzie [name] is here to discuss her progress with her obesity treatment plan along with follow-up of her obesity related diagnoses. Mackenzie [name] is on the Category 2 Plan and states she is following her eating plan approximately 75% of the time. [name] states she is walking for 2 miles 1-2 times per week.  Today's visit was #: 3 Starting weight: 267 lbs Starting date: 08/09/2019 Today's weight: 266 lbs Today's date: 09/19/2019 Total lbs lost to date: 1 lb Total lbs lost since last in-office visit: 0  Interim History: [name] reports that she was on vacation for 3 days and did the best she could with her eating plan.  Her hunger is well-controlled.  Subjective:   1. Prediabetes Mackenzie [name] has a diagnosis of prediabetes based on her elevated HgA1c and was informed this puts her at greater risk of developing diabetes. She continues to work on diet and exercise to decrease her risk of diabetes. She denies hypoglycemia. She is taking metformin.  No nausea, vomiting, diarrhea, or polyphagia.   Lab Results  Component Value Date   HGBA1C 5.9 (H) 08/09/2019   Lab Results  Component Value Date   INSULIN 12.6 08/09/2019   2. Other hyperlipidemia Mackenzie [name] has hyperlipidemia and has been trying to improve her cholesterol levels with intensive lifestyle modification including a low saturated fat diet, exercise and weight loss. She denies any chest pain, claudication or myalgias.  She is taking no medication.  Lab Results  Component Value Date   ALT 20 08/09/2019   AST 19 08/09/2019   ALKPHOS 157 (H) 08/09/2019   BILITOT 0.4 08/09/2019   Lab Results  Component Value Date   CHOL 191 08/09/2019   HDL 68 08/09/2019   LDLCALC 115 (H) 08/09/2019   TRIG 42 08/09/2019   3. At risk for diabetes mellitus Mackenzie [name] is at higher than average risk for developing diabetes due to her obesity.   Assessment/Plan:   1. Prediabetes Mackenzie [name] will continue to work on weight loss,  exercise, and decreasing simple carbohydrates to help decrease the risk of diabetes.   - metFORMIN (GLUCOPHAGE) 500 MG tablet; Take 1 tablet (500 mg total) by mouth every morning.  Dispense: 30 tablet; Refill: 0  2. Other hyperlipidemia Cardiovascular risk and specific lipid/LDL goals reviewed.  We discussed several lifestyle modifications today and Mackenzie [name] will continue to work on diet, exercise and weight loss efforts. Orders and follow up as documented in patient record.    Counseling Intensive lifestyle modifications are the first line treatment for this issue. . Dietary changes: Increase soluble fiber. Decrease simple carbohydrates. . Exercise changes: Moderate to vigorous-intensity aerobic activity 150 minutes per week if tolerated. . Lipid-lowering medications: see documented in medical record.  3. At risk for diabetes mellitus Mayah was given approximately 15 minutes of diabetes education and counseling today. We discussed intensive lifestyle modifications today with an emphasis on weight loss as well as increasing exercise and decreasing simple carbohydrates in her diet. We also reviewed medication options with an emphasis on risk versus benefit of those discussed.   Repetitive spaced learning was employed today to elicit superior memory formation and behavioral change.  4. Class 3 severe obesity with serious comorbidity and body mass index (BMI) of 40.0 to 44.9 in adult, unspecified obesity type (HCC) Mackenzie [name] is currently in the action stage of change. As such, her goal is to continue with weight loss efforts. She has agreed to the Category 2 Plan.   Exercise goals: As  is.  Behavioral modification strategies: meal planning and cooking strategies and keeping healthy foods in the home.  Mackenzie [name] has agreed to follow-up with our clinic in 2 weeks. She was informed of the importance of frequent follow-up visits to maximize her success with intensive lifestyle modifications for her  multiple health conditions.   Objective:   Blood pressure 126/78, pulse 88, temperature 98 F (36.7 C), height 5\' 5"  (1.651 m), weight 266 lb (120.7 kg), last menstrual period 02/10/2017, SpO2 99 %, unknown if currently breastfeeding. Body mass index is 44.26 kg/m.  General: Cooperative, alert, well developed, in no acute distress. HEENT: Conjunctivae and lids unremarkable. Cardiovascular: Regular rhythm.  Lungs: Normal work of breathing. Neurologic: No focal deficits.   Lab Results  Component Value Date   CREATININE 0.88 08/09/2019   BUN 13 08/09/2019   NA 136 08/09/2019   K 4.3 08/09/2019   CL 100 08/09/2019   CO2 25 08/09/2019   Lab Results  Component Value Date   ALT 20 08/09/2019   AST 19 08/09/2019   ALKPHOS 157 (H) 08/09/2019   BILITOT 0.4 08/09/2019   Lab Results  Component Value Date   HGBA1C 5.9 (H) 08/09/2019   HGBA1C 5.9 (H) 07/26/2018   Lab Results  Component Value Date   INSULIN 12.6 08/09/2019   Lab Results  Component Value Date   TSH 0.657 08/09/2019   Lab Results  Component Value Date   CHOL 191 08/09/2019   HDL 68 08/09/2019   LDLCALC 115 (H) 08/09/2019   TRIG 42 08/09/2019   Lab Results  Component Value Date   WBC 6.0 08/09/2019   HGB 12.7 08/09/2019   HCT 38.1 08/09/2019   MCV 87 08/09/2019   PLT 327 08/09/2019   Attestation Statements:   Reviewed by clinician on day of visit: allergies, medications, problem list, medical history, surgical history, family history, social history, and previous encounter notes.  I, Water quality scientist, CMA, am acting as Location manager for [employer], PA-C.  I have reviewed the above documentation for accuracy and completeness, and I agree with the above. Abby Potash, PA-C

## 2019-09-19 NOTE — Patient Instructions (Signed)

## 2019-09-20 ENCOUNTER — Ambulatory Visit: Payer: 59 | Admitting: Plastic Surgery

## 2019-09-20 ENCOUNTER — Encounter: Payer: Self-pay | Admitting: Plastic Surgery

## 2019-09-20 VITALS — BP 152/85 | HR 65 | Temp 97.5°F | Ht 65.0 in | Wt 266.0 lb

## 2019-09-20 DIAGNOSIS — N651 Disproportion of reconstructed breast: Secondary | ICD-10-CM

## 2019-09-20 DIAGNOSIS — Z9012 Acquired absence of left breast and nipple: Secondary | ICD-10-CM | POA: Diagnosis not present

## 2019-09-20 NOTE — Progress Notes (Signed)
   Subjective:    Patient ID: Mackenzie [name], female    DOB: Jan 16, 1971, 49 y.o.   MRN: [id number]  Patient is a 49 year old female here with her breast reconstruction.  She had a reduction on the right and a latissimus muscle reconstruction on the left with implant placement.  Overall she is doing extremely well.  She has much better symmetry than in the past.  She is 5 feet 5 inches tall and weighs 266 pounds.  She has decreased her weight from her last visit.  She is working with the [facility] and has been doing really well to improve her weight.  She still has to wear a little padding in her left bra.  She like to see if there is a way she could become more symmetric.  Otherwise she is doing great.   Review of Systems  Constitutional: Negative.   HENT: Negative.   Eyes: Negative.   Respiratory: Negative.   Cardiovascular: Negative.   Gastrointestinal: Negative.   Genitourinary: Negative.   Musculoskeletal: Negative.   Hematological: Negative.   Psychiatric/Behavioral: Negative.        Objective:   Physical Exam Vitals and nursing note reviewed.  Constitutional:      Appearance: Normal appearance.  Cardiovascular:     Rate and Rhythm: Normal rate.     Pulses: Normal pulses.  Pulmonary:     Effort: Pulmonary effort is normal.  Abdominal:     General: Abdomen is flat. There is no distension.     Tenderness: There is no abdominal tenderness.  Skin:    General: Skin is warm.     Capillary Refill: Capillary refill takes less than 2 seconds.  Neurological:     General: No focal deficit present.     Mental Status: She is alert and oriented to person, place, and time.  Psychiatric:        Mood and Affect: Mood normal.        Behavior: Behavior normal.        Thought Content: Thought content normal.         Assessment & Plan:     ICD-10-CM   1. Acquired absence of left breast  Z90.12   2. Breast asymmetry following reconstructive surgery   N65.1     We discussed some options for improved symmetry.  She could have the right breast made smaller.  She would like to avoid that if possible.  The left breast could be made larger with fat grafting.  With her radiation this is a limited.  It is possible we have to go back to an expander.   I think it would be best to wait until her weight is more stable and where she wants it to be.  This could create asymmetry on its own.  The patient agrees and is happy to come see me in 6 months.  Pictures were obtained of the patient and placed in the chart with the patient's or guardian's permission.

## 2019-09-21 ENCOUNTER — Encounter: Payer: Self-pay | Admitting: Physical Therapy

## 2019-09-21 ENCOUNTER — Other Ambulatory Visit: Payer: Self-pay

## 2019-09-21 ENCOUNTER — Ambulatory Visit: Payer: 59 | Admitting: Physical Therapy

## 2019-09-21 DIAGNOSIS — Z9189 Other specified personal risk factors, not elsewhere classified: Secondary | ICD-10-CM | POA: Diagnosis not present

## 2019-09-21 DIAGNOSIS — M25561 Pain in right knee: Secondary | ICD-10-CM | POA: Diagnosis not present

## 2019-09-21 DIAGNOSIS — M25612 Stiffness of left shoulder, not elsewhere classified: Secondary | ICD-10-CM

## 2019-09-21 DIAGNOSIS — G8929 Other chronic pain: Secondary | ICD-10-CM | POA: Diagnosis not present

## 2019-09-21 DIAGNOSIS — M25661 Stiffness of right knee, not elsewhere classified: Secondary | ICD-10-CM | POA: Diagnosis not present

## 2019-09-21 DIAGNOSIS — M25512 Pain in left shoulder: Secondary | ICD-10-CM | POA: Diagnosis not present

## 2019-09-21 DIAGNOSIS — Z483 Aftercare following surgery for neoplasm: Secondary | ICD-10-CM | POA: Diagnosis not present

## 2019-09-21 DIAGNOSIS — M25562 Pain in left knee: Secondary | ICD-10-CM | POA: Diagnosis not present

## 2019-09-21 DIAGNOSIS — M6281 Muscle weakness (generalized): Secondary | ICD-10-CM | POA: Diagnosis not present

## 2019-09-21 NOTE — Therapy (Signed)
[facility] [address] [place], [place], [address] Phone: [phone]   Fax:  [phone]  Physical Therapy Treatment  Patient Details  Name: Mackenzie [name] MRN: [id number] Date of Birth: September 27, 1970 Referring Provider (PT): [name]    Encounter Date: 09/21/2019  PT End of Session - 09/21/19 1941    Visit Number  10    Number of Visits  17    Date for PT Re-Evaluation  10/10/19    PT Start Time  1500    PT Stop Time  1545    PT Time Calculation (min)  45 min    Equipment Utilized During Treatment  Other (comment)   ankle buoyancy cuffs   Activity Tolerance  Patient tolerated treatment well    Behavior During Therapy  [facility] for tasks assessed/performed       Past Medical History:  Diagnosis Date  . Abnormal glucose 2018  . 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 Allergic rhinitis 2012  . Anemia 01/27/2017   prior to starting chemotherapy  . Breast cancer ([place]) 01/14/2017   Left breast  . 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 Edema, lower extremity   . Fatty liver   . Hot flashes 03/2017  . Hyperlipidemia 03/20/2016  . Morbid obesity with body mass index (BMI) of 40.0 to 49.9 ([place]) 09/17/2017  . NCGS (non-celiac gluten sensitivity)   . Pre-diabetes   . Seasonal allergies 2012   seasonal allergies causes allergic rhinitis and itchy, dry eyes per pt  . Vitamin D deficiency 05/2015    Past Surgical History:  Procedure Laterality Date  . BREAST RECONSTRUCTION WITH PLACEMENT OF TISSUE EXPANDER AND FLEX HD (ACELLULAR HYDRATED DERMIS) Left 08/27/2017   Procedure: LEFT BREAST RECONSTRUCTION WITH PLACEMENT OF TISSUE EXPANDER AND FLEX HD;  Surgeon: Wallace Going, DO;  Location: [facility];  Service: Plastics;  Laterality: Left;  . CESAREAN SECTION     x2  . LATISSIMUS FLAP TO BREAST Left 11/03/2018  . LATISSIMUS FLAP TO BREAST Left 11/03/2018   Procedure:  LATISSIMUS FLAP TO LEFT BREAST;  Surgeon: Wallace Going, DO;  Location: [facility];  Service: Plastics;  Laterality: Left;  Marland Kitchen MASTECTOMY MODIFIED RADICAL Left 08/27/2017  . MASTECTOMY MODIFIED RADICAL Left 08/27/2017   Procedure: LEFT MODIFIED RADICAL MASTECTOMY;  Surgeon: Erroll Luna, MD;  Location: [facility];  Service: General;  Laterality: Left;  Marland Kitchen MASTOPEXY Right 03/03/2019   Procedure: RIGHT BREAST MASTOPEXY/REDUCTION;  Surgeon: Wallace Going, DO;  Location: [facility];  Service: Plastics;  Laterality: Right;  3 hours, please  . PORT-A-CATH REMOVAL Right 08/27/2017   Procedure: REMOVAL PORT-A-CATH RIGHT CHEST;  Surgeon: Erroll Luna, MD;  Location: [facility];  Service: General;  Laterality: Right;  . PORTACATH PLACEMENT Right 01/28/2017   Procedure: INSERTION PORT-A-CATH WITH ULTRASOUND;  Surgeon: Erroll Luna, MD;  Location: [facility];  Service: General;  Laterality: Right;  . REMOVAL OF TISSUE EXPANDER AND PLACEMENT OF IMPLANT Left 03/03/2019   Procedure: REMOVAL OF TISSUE EXPANDER AND PLACEMENT OF EXPANDER;  Surgeon: Wallace Going, DO;  Location: [facility];  Service: Plastics;  Laterality: Left;  . REMOVAL OF TISSUE EXPANDER AND PLACEMENT OF IMPLANT Left 05/25/2019   Procedure: LEFT BREAST REMOVAL OF TISSUE EXPANDER AND PLACEMENT OF IMPLANT;  Surgeon: Wallace Going, DO;  Location: [facility];  Service: Plastics;  Laterality: Left;  . TISSUE EXPANDER PLACEMENT Left  11/03/2018   Procedure: PLACEMENT OF TISSUE EXPANDER LEFT BREAST;  Surgeon: Wallace Going, DO;  Location: [facility];  Service: Plastics;  Laterality: Left;  Total case time is 3.5 hours  . TUBAL LIGATION Bilateral 01/21/2011    There were no vitals filed for this visit.  Subjective Assessment - 09/21/19 1940    Subjective  Denies any falls or changes since last visit.  Felt well after last session.    Pertinent History  Left breast cancer  diagnosed 01/10/17 by mammogram and needle biopsy. Then had neo-adjuvant chemo starting 02/10/17 until 06/26/17; had left mastectomy 08/27/17 with ALND and immediate expander placed. Radiation completed. Expander removed and replaced on May 27, 19 b/c it did not get expanded enough prior to radiation, also had lat flap 11/02/17. Had expander removed and replaced again on 03/03/19 due to a small leak, pt had reduction on R breast at this time    Patient Stated Goals  to see if arm is ok, do what she needs to do to get back to exercising so she can do things with her 51 year old    Currently in Pain?  No/denies    Pain Onset  More than a month ago        Aquatic therapy at [facility] - pool temp 86.7degrees  Patient seen for aquatic therapy today. Treatment took place in water 3.5-4 feet deep depending upon activity. Pt entered and exited the pool via ramp with supervision:   Runners stretch bil LE's and toes/feet up edge of pool x 30 seconds each bil LE's.  Pt performed gait training in pool forwards 56m x 2 repswithout UE support, 59m x 2 working on bil arm swing then 33m x 2 repsbackwards. Performed side stepping 13m x 2 thenside stepping squatsx 23m x 2.Cues for technique/sequence.  Standing withankle buoyancy cuffs and UE support of pool edge for bil LE marching, hip extension, hip abd, hamstring curl. Pt able to perform 15 reps each side alternating sides.  Heel raises x 10, toe raises x 10.  Squats while standing at pool edge for UE support x 10 reps. Single leg squats 10 reps each with UE support.  Jogging 23m x 2 reps  Forward<>backward step weight shifting without UE support.  Tandem gait 80m forwards and 29m backwards.  Braiding 74m x 2 reps.  Bil quad stretch standing x 30 sec each side.  Pt requires buoyancy for support with balance and viscosity for resistance for strengthening exercises; buoyancy is also needed for off loading body to assist with exercises.    PT Long  Term Goals - 08/17/19 1624      PT LONG TERM GOAL #1   Title  Pt will have well fiting prophylacitic compression garments for left arm    Time  8    Period  Weeks    Status  On-going      PT LONG TERM GOAL #2   Title  Pt will report the numbness in her left arm is decreased by 50%    Status  Achieved      PT LONG TERM GOAL #3   Title  Pt will report stiffness in both knees is decreased by 50%    Time  8    Period  Weeks    Status  On-going      PT LONG TERM GOAL #4   Title  Pt will be able to continue with an aquatic exercise program upon discontinuation of PT  Time  8    Period  Weeks    Status  On-going            Plan - 09/21/19 1942    Clinical Impression Statement  Skilled aquatic session focused on strength, balance and endurance.  Pt with improved reps and resistance with cuffs today.  Cont PT per POC,    Personal Factors and Comorbidities  Profession;Time since onset of injury/illness/exacerbation    Examination-Activity Limitations  Caring for Others;Reach Overhead;Carry;Dressing    Examination-Participation Restrictions  Community Activity;Cleaning    Rehab Potential  Good    Clinical Impairments Affecting Rehab Potential  hx of radiation    PT Frequency  2x / week    PT Duration  8 weeks    PT Treatment/Interventions  ADLs/Self Care Home Management;Therapeutic exercise;Patient/family education;Manual techniques;Manual lymph drainage;Compression bandaging;Scar mobilization;Passive range of motion;Therapeutic activities;Aquatic Therapy;Orthotic Fit/Training    PT Next Visit Plan  continue with core strength and mobilyt as well as LE strength to decrease knee pain and improve balance.    PT Home Exercise Plan  Access Code: [email or web address], supine over foam roll, rockwood for LUE, wall washing, 3 way raises, corner stretch, post op stretches for after surgery of Dr. Sherrine Maples, Strength ABC program 08/17/2019 Access Code: [email or web address] 08/23/2019 hips and core exericse program  Access  Code: [email or web address]    Consulted and Agree with Plan of Care  Patient       Patient will benefit from skilled therapeutic intervention in order to improve the following deficits and impairments:  Decreased range of motion, Impaired UE functional use, Decreased strength, Other (comment), Pain, Postural dysfunction  Visit Diagnosis: Stiffness of left shoulder, not elsewhere classified  Muscle weakness (generalized)     Problem List Patient Active Problem List   Diagnosis Date Noted  . Breast asymmetry following reconstructive surgery 09/20/2019  . Benign essential HTN 08/17/2018  . Morbid obesity with body mass index (BMI) of 40.0 to 49.9 ([place]) 09/17/2017  . 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 Malignant neoplasm of upper-inner quadrant of left breast in female, estrogen receptor positive ([place]) 07/27/2017  . Genetic testing 03/25/2017  . Malignant 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, estrogen receptor positive ([place]) 01/23/2017  . 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 [place], [place], [address] Phone: [phone]   Fax:  [phone]  Name: ZILLIE LEGE MRN: [id number] Date of Birth: Mar 15, 1971

## 2019-09-22 ENCOUNTER — Ambulatory Visit: Payer: 59 | Admitting: Physical Therapy

## 2019-09-22 ENCOUNTER — Encounter: Payer: Self-pay | Admitting: Physical Therapy

## 2019-09-22 DIAGNOSIS — M25561 Pain in right knee: Secondary | ICD-10-CM | POA: Diagnosis not present

## 2019-09-22 DIAGNOSIS — Z9189 Other specified personal risk factors, not elsewhere classified: Secondary | ICD-10-CM | POA: Diagnosis not present

## 2019-09-22 DIAGNOSIS — M25661 Stiffness of right knee, not elsewhere classified: Secondary | ICD-10-CM | POA: Diagnosis not present

## 2019-09-22 DIAGNOSIS — M25662 Stiffness of left knee, not elsewhere classified: Secondary | ICD-10-CM

## 2019-09-22 DIAGNOSIS — G8929 Other chronic pain: Secondary | ICD-10-CM | POA: Diagnosis not present

## 2019-09-22 DIAGNOSIS — M25512 Pain in left shoulder: Secondary | ICD-10-CM | POA: Diagnosis not present

## 2019-09-22 DIAGNOSIS — M25612 Stiffness of left shoulder, not elsewhere classified: Secondary | ICD-10-CM

## 2019-09-22 DIAGNOSIS — M6281 Muscle weakness (generalized): Secondary | ICD-10-CM | POA: Diagnosis not present

## 2019-09-22 DIAGNOSIS — M25562 Pain in left knee: Secondary | ICD-10-CM | POA: Diagnosis not present

## 2019-09-22 DIAGNOSIS — Z483 Aftercare following surgery for neoplasm: Secondary | ICD-10-CM | POA: Diagnosis not present

## 2019-09-22 NOTE — Therapy (Signed)
[facility] [place], [place], [address] Phone: [phone]   Fax:  [phone]  Physical Therapy Treatment  Patient Details  Name: Mackenzie [name] MRN: [id number] Date of Birth: 14-Jul-1970 Referring Provider (PT): [name]    Encounter Date: 09/22/2019  PT End of Session - 09/22/19 1452    Visit Number  11    Number of Visits  17    Date for PT Re-Evaluation  10/10/19    PT Start Time  [id number]    PT Stop Time  1448    PT Time Calculation (min)  105 min    Activity Tolerance  Patient tolerated treatment well    Behavior During Therapy  [facility] for tasks assessed/performed       Past Medical History:  Diagnosis Date  . Abnormal glucose 2018  . 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 Allergic rhinitis 2012  . Anemia 01/27/2017   prior to starting chemotherapy  . Breast cancer ([place]) 01/14/2017   Left breast  . 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 Edema, lower extremity   . Fatty liver   . Hot flashes 03/2017  . Hyperlipidemia 03/20/2016  . Morbid obesity with body mass index (BMI) of 40.0 to 49.9 ([place]) 09/17/2017  . NCGS (non-celiac gluten sensitivity)   . Pre-diabetes   . Seasonal allergies 2012   seasonal allergies causes allergic rhinitis and itchy, dry eyes per pt  . Vitamin D deficiency 05/2015    Past Surgical History:  Procedure Laterality Date  . BREAST RECONSTRUCTION WITH PLACEMENT OF TISSUE EXPANDER AND FLEX HD (ACELLULAR HYDRATED DERMIS) Left 08/27/2017   Procedure: LEFT BREAST RECONSTRUCTION WITH PLACEMENT OF TISSUE EXPANDER AND FLEX HD;  Surgeon: Wallace Going, DO;  Location: [facility];  Service: Plastics;  Laterality: Left;  . CESAREAN SECTION     x2  . LATISSIMUS FLAP TO BREAST Left 11/03/2018  . LATISSIMUS FLAP TO BREAST Left 11/03/2018   Procedure: LATISSIMUS FLAP TO LEFT BREAST;  Surgeon: Wallace Going, DO;  Location: [facility];   Service: Plastics;  Laterality: Left;  Marland Kitchen MASTECTOMY MODIFIED RADICAL Left 08/27/2017  . MASTECTOMY MODIFIED RADICAL Left 08/27/2017   Procedure: LEFT MODIFIED RADICAL MASTECTOMY;  Surgeon: Erroll Luna, MD;  Location: [facility];  Service: General;  Laterality: Left;  Marland Kitchen MASTOPEXY Right 03/03/2019   Procedure: RIGHT BREAST MASTOPEXY/REDUCTION;  Surgeon: Wallace Going, DO;  Location: [facility];  Service: Plastics;  Laterality: Right;  3 hours, please  . PORT-A-CATH REMOVAL Right 08/27/2017   Procedure: REMOVAL PORT-A-CATH RIGHT CHEST;  Surgeon: Erroll Luna, MD;  Location: [facility];  Service: General;  Laterality: Right;  . PORTACATH PLACEMENT Right 01/28/2017   Procedure: INSERTION PORT-A-CATH WITH ULTRASOUND;  Surgeon: Erroll Luna, MD;  Location: [facility];  Service: General;  Laterality: Right;  . REMOVAL OF TISSUE EXPANDER AND PLACEMENT OF IMPLANT Left 03/03/2019   Procedure: REMOVAL OF TISSUE EXPANDER AND PLACEMENT OF EXPANDER;  Surgeon: Wallace Going, DO;  Location: [facility];  Service: Plastics;  Laterality: Left;  . REMOVAL OF TISSUE EXPANDER AND PLACEMENT OF IMPLANT Left 05/25/2019   Procedure: LEFT BREAST REMOVAL OF TISSUE EXPANDER AND PLACEMENT OF IMPLANT;  Surgeon: Wallace Going, DO;  Location: [facility];  Service: Plastics;  Laterality: Left;  . TISSUE EXPANDER PLACEMENT Left 11/03/2018   Procedure: PLACEMENT OF TISSUE EXPANDER LEFT BREAST;  Surgeon: Wallace Going,  DO;  Location: [facility];  Service: Plastics;  Laterality: Left;  Total case time is 3.5 hours  . TUBAL LIGATION Bilateral 01/21/2011    There were no vitals filed for this visit.  Subjective Assessment - 09/22/19 1308    Subjective  Pt states she is doing well with aquatic exercises and has really increased her exercise quantitiy and effort.  She feels really good about it but noticed she has increased swelling in her back at the end of  her lat flap incision    Pertinent History  Left breast cancer diagnosed 01/10/17 by mammogram and needle biopsy. Then had neo-adjuvant chemo starting 02/10/17 until 06/26/17; had left mastectomy 08/27/17 with ALND and immediate expander placed. Radiation completed. Expander removed and replaced on May 27, 19 b/c it did not get expanded enough prior to radiation, also had lat flap 11/02/17. Had expander removed and replaced again on 03/03/19 due to a small leak, pt had reduction on R breast at this time    Patient Stated Goals  to see if arm is ok, do what she needs to do to get back to exercising so she can do things with her 51 year old    Currently in Pain?  No/denies            LYMPHEDEMA/ONCOLOGY QUESTIONNAIRE - 09/22/19 1313      Left Upper Extremity Lymphedema   10 cm Proximal to Olecranon Process  42 cm    Olecranon Process  31.5 cm    15 cm Proximal to Ulnar Styloid Process  31 cm    10 cm Proximal to Ulnar Styloid Process  28.6 cm    Just Proximal to Ulnar Styloid Process  18 cm    Across Hand at [employer]  20 cm    At [place] of 2nd Digit  6.4 cm                [facility] Adult PT Treatment/Exercise - 09/22/19 0001      Exercises   Exercises  Shoulder;Lumbar      Lumbar Exercises: Stretches   Sports administrator  Right;Left;1 rep;20 seconds    Quad Stretch Limitations  sitting on corner of mat with foot behind her       Lumbar Exercises: Standing   Functional Squats  10 reps    Functional Squats Limitations  wall taps with glute set at the top     Other Standing Lumbar Exercises  hip hinge with 5 pound weight in each hand 2 sets of 10     Other Standing Lumbar Exercises  weight shift and toe tap on 4 " step 2 sets of 10       Shoulder Exercises: Standing   External Rotation  Strengthening;Right;Left;5 reps;Theraband    Theraband Level (Shoulder External Rotation)  Level 1 (Yellow)    External Rotation Limitations  walk outs with folded towel under arm and tactile cues to  keep arm from rotating in.  Pt with fatigue with this exercise     Row  Strengthening;Right;Left;10 reps;Theraband    Theraband Level (Shoulder Row)  Level 3 (Green)    Row Limitations  cues for core activation.    Other Standing Exercises  dowel rod stretches for flexion, abduction,       Manual Therapy   Manual Therapy  Edema management    Manual therapy comments  Remeasured left am. She has had reduction in hand and wrist but slight increase in forearm and upper arm.  She went to [facility] and reordered a regular length sleeve.  She has not had the numbness since she stopped wearing the long sleeve that had to be rolled down at the axilla.     Edema Management  Pt has a soft " dog ear" at distal end of lat scar.  Mailyn usually wears a compressivie body shaper that can be pulled up to cover this area and meet the area of her bra strap so that her this whole area is compressed.  She will try this for a few days to see if the fullness is reabsorbed                   PT Long Term Goals - 08/17/19 1624      PT LONG TERM GOAL #1   Title  Pt will have well fiting prophylacitic compression garments for left arm    Time  8    Period  Weeks    Status  On-going      PT LONG TERM GOAL #2   Title  Pt will report the numbness in her left arm is decreased by 50%    Status  Achieved      PT LONG TERM GOAL #3   Title  Pt will report stiffness in both knees is decreased by 50%    Time  8    Period  Weeks    Status  On-going      PT LONG TERM GOAL #4   Title  Pt will be able to continue with an aquatic exercise program upon discontinuation of PT    Time  8    Period  Weeks    Status  On-going            Plan - 09/22/19 1453    Clinical Impression Statement  Pt has increased her exercise and is feeling better overall with no episodes of left arm numbness. she has noticed some fullness in her back that she will start to manage with compression  and her arm is slightly  increased.  Her new sleeve is ordered, but feel she would also benefit from a night garment. Contacted [facility] to have a tribute Orthoptist measured next week if she has insurance coverage.  Pt did well with strengthening today and will continue with one session of aquatic PT and once session of PT per week.    Personal Factors and Comorbidities  Profession;Time since onset of injury/illness/exacerbation    Examination-Activity Limitations  Caring for Others;Reach Overhead;Carry;Dressing    Examination-Participation Restrictions  Community Activity;Cleaning    Stability/Clinical Decision Making  Stable/Uncomplicated    Clinical Impairments Affecting Rehab Potential  hx of radiation    PT Frequency  2x / week    PT Duration  8 weeks    PT Treatment/Interventions  ADLs/Self Care Home Management;Therapeutic exercise;Patient/family education;Manual techniques;Manual lymph drainage;Compression bandaging;Scar mobilization;Passive range of motion;Therapeutic activities;Aquatic Therapy;Orthotic Fit/Training    PT Next Visit Plan  continue with core strength and mobilyt as well as LE strength to decrease knee pain and improve balance.    PT Home Exercise Plan  Access Code: [email or web address], supine over foam roll, rockwood for LUE, wall washing, 3 way raises, corner stretch, post op stretches for after surgery of Dr. Sherrine Maples, Strength ABC program 08/17/2019 Access Code: [email or web address] 08/23/2019 hips and core exericse program  Access Code: [email or web address]    Consulted and Agree with Plan of Care  Patient       Patient  will benefit from skilled therapeutic intervention in order to improve the following deficits and impairments:  Decreased range of motion, Impaired UE functional use, Decreased strength, Other (comment), Pain, Postural dysfunction  Visit Diagnosis: Stiffness of left shoulder, not elsewhere classified  Muscle weakness (generalized)  Aftercare following surgery for neoplasm  Chronic pain of right knee  Chronic pain  of left knee  Stiffness of knee joint, right  Stiffness of knee joint, left  At risk for lymphedema     Problem List Patient Active Problem List   Diagnosis Date Noted  . Breast asymmetry following reconstructive surgery 09/20/2019  . Benign essential HTN 08/17/2018  . Morbid obesity with body mass index (BMI) of 40.0 to 49.9 ([place]) 09/17/2017  . 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 Malignant neoplasm of upper-inner quadrant of left breast in female, estrogen receptor positive ([place]) 07/27/2017  . Genetic testing 03/25/2017  . Malignant 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, estrogen receptor positive ([place]) 01/23/2017  . 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 Owens Shark PT  [place] Levo 09/22/2019, 2:57 PM  [facility] [place], [place], [address] Phone: [phone]   Fax:  [phone]  Name: TIYE JIN MRN: [id number] Date of Birth: 05-08-1971

## 2019-09-26 ENCOUNTER — Encounter: Payer: Self-pay | Admitting: Physical Therapy

## 2019-09-26 ENCOUNTER — Ambulatory Visit: Payer: 59 | Admitting: Physical Therapy

## 2019-09-26 ENCOUNTER — Other Ambulatory Visit: Payer: Self-pay

## 2019-09-26 DIAGNOSIS — M25661 Stiffness of right knee, not elsewhere classified: Secondary | ICD-10-CM | POA: Diagnosis not present

## 2019-09-26 DIAGNOSIS — Z483 Aftercare following surgery for neoplasm: Secondary | ICD-10-CM

## 2019-09-26 DIAGNOSIS — M6281 Muscle weakness (generalized): Secondary | ICD-10-CM

## 2019-09-26 DIAGNOSIS — Z9189 Other specified personal risk factors, not elsewhere classified: Secondary | ICD-10-CM

## 2019-09-26 DIAGNOSIS — M25512 Pain in left shoulder: Secondary | ICD-10-CM

## 2019-09-26 DIAGNOSIS — M25562 Pain in left knee: Secondary | ICD-10-CM

## 2019-09-26 DIAGNOSIS — G8929 Other chronic pain: Secondary | ICD-10-CM | POA: Diagnosis not present

## 2019-09-26 DIAGNOSIS — M25561 Pain in right knee: Secondary | ICD-10-CM | POA: Diagnosis not present

## 2019-09-26 DIAGNOSIS — M25612 Stiffness of left shoulder, not elsewhere classified: Secondary | ICD-10-CM

## 2019-09-26 NOTE — Therapy (Signed)
[place], [place], [address] Phone: [phone]   Fax:  [phone]  Physical Therapy Treatment  Patient Details  Name: Mackenzie [name] MRN: [id number] Date of Birth: 11/24/1970 Referring Provider (PT): [name]    Encounter Date: 09/26/2019  PT End of Session - 09/26/19 1350    Visit Number  12    Number of Visits  17    Date for PT Re-Evaluation  10/10/19    PT Start Time  0805    PT Stop Time  0850    PT Time Calculation (min)  45 min    Activity Tolerance  Patient tolerated treatment well       Past Medical History:  Diagnosis Date  . Abnormal glucose 2018  . 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 Allergic rhinitis 2012  . Anemia 01/27/2017   prior to starting chemotherapy  . Breast cancer ([place]) 01/14/2017   Left breast  . 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 Edema, lower extremity   . Fatty liver   . Hot flashes 03/2017  . Hyperlipidemia 03/20/2016  . Morbid obesity with body mass index (BMI) of 40.0 to 49.9 ([place]) 09/17/2017  . NCGS (non-celiac gluten sensitivity)   . Pre-diabetes   . Seasonal allergies 2012   seasonal allergies causes allergic rhinitis and itchy, dry eyes per pt  . Vitamin D deficiency 05/2015    Past Surgical History:  Procedure Laterality Date  . BREAST RECONSTRUCTION WITH PLACEMENT OF TISSUE EXPANDER AND FLEX HD (ACELLULAR HYDRATED DERMIS) Left 08/27/2017   Procedure: LEFT BREAST RECONSTRUCTION WITH PLACEMENT OF TISSUE EXPANDER AND FLEX HD;  Surgeon: Wallace Going, DO;  Location: [facility];  Service: Plastics;  Laterality: Left;  . CESAREAN SECTION     x2  . LATISSIMUS FLAP TO BREAST Left 11/03/2018  . LATISSIMUS FLAP TO BREAST Left 11/03/2018   Procedure: LATISSIMUS FLAP TO LEFT BREAST;  Surgeon: Wallace Going, DO;  Location: [facility];  Service: Plastics;  Laterality: Left;  Marland Kitchen MASTECTOMY MODIFIED  RADICAL Left 08/27/2017  . MASTECTOMY MODIFIED RADICAL Left 08/27/2017   Procedure: LEFT MODIFIED RADICAL MASTECTOMY;  Surgeon: Erroll Luna, MD;  Location: [facility];  Service: General;  Laterality: Left;  Marland Kitchen MASTOPEXY Right 03/03/2019   Procedure: RIGHT BREAST MASTOPEXY/REDUCTION;  Surgeon: Wallace Going, DO;  Location: [facility];  Service: Plastics;  Laterality: Right;  3 hours, please  . PORT-A-CATH REMOVAL Right 08/27/2017   Procedure: REMOVAL PORT-A-CATH RIGHT CHEST;  Surgeon: Erroll Luna, MD;  Location: [facility];  Service: General;  Laterality: Right;  . PORTACATH PLACEMENT Right 01/28/2017   Procedure: INSERTION PORT-A-CATH WITH ULTRASOUND;  Surgeon: Erroll Luna, MD;  Location: [facility];  Service: General;  Laterality: Right;  . REMOVAL OF TISSUE EXPANDER AND PLACEMENT OF IMPLANT Left 03/03/2019   Procedure: REMOVAL OF TISSUE EXPANDER AND PLACEMENT OF EXPANDER;  Surgeon: Wallace Going, DO;  Location: [facility];  Service: Plastics;  Laterality: Left;  . REMOVAL OF TISSUE EXPANDER AND PLACEMENT OF IMPLANT Left 05/25/2019   Procedure: LEFT BREAST REMOVAL OF TISSUE EXPANDER AND PLACEMENT OF IMPLANT;  Surgeon: Wallace Going, DO;  Location: [facility];  Service: Plastics;  Laterality: Left;  . TISSUE EXPANDER PLACEMENT Left 11/03/2018   Procedure: PLACEMENT OF TISSUE EXPANDER LEFT BREAST;  Surgeon: Wallace Going, DO;  Location: [facility];  Service: Plastics;  Laterality: Left;  Total case time is 3.5 hours  . TUBAL LIGATION Bilateral 01/21/2011    There were no vitals filed for this visit.  Subjective Assessment - 09/26/19 1335    Subjective  Pt states she was doing yardwork over the weekend and developed some pain in her neck and left shoulder.    Pertinent History  Left breast cancer diagnosed 01/10/17 by mammogram and needle biopsy. Then had neo-adjuvant chemo starting 02/10/17 until 06/26/17; had left  mastectomy 08/27/17 with ALND and immediate expander placed. Radiation completed. Expander removed and replaced on May 27, 19 b/c it did not get expanded enough prior to radiation, also had lat flap 11/02/17. Had expander removed and replaced again on 03/03/19 due to a small leak, pt had reduction on R breast at this time    Patient Stated Goals  to see if arm is ok, do what she needs to do to get back to exercising so she can do things with her 51 year old    Currently in Pain?  Yes    Pain Score  6     Pain Orientation  Left    Pain Descriptors / Indicators  Sore    Pain Type  Acute pain    Pain Onset  In the past 7 days    Pain Frequency  Intermittent    Aggravating Factors   touching it    Pain Relieving Factors  resting                       [facility] Adult PT Treatment/Exercise - 09/26/19 0001      Exercises   Exercises  Neck;Shoulder;Knee/Hip      Neck Exercises: Standing   Other Standing Exercises  neck flexion, extension, rotation, lateral flexion , shoulder rolls and arm reaches and side bends for warm up       Knee/Hip Exercises: Stretches   Other Knee/Hip Stretches  forward and lateral lunges for warm up       Knee/Hip Exercises: Standing   Heel Raises  Right;Left;5 reps    Other Standing Knee Exercises  leaning forward on high mat table for hip extension,  abduction, knee flexion for slow count of 5 on each leg with extra attention to stablity on standing leg and controlled movement on moving leg,       Shoulder Exercises: Standing   Other Standing Exercises  leaning forward on high mat table for modified pland 2 reps of 15 sec., shoulder taps x 10 with stability on weight bearing arm . "L" stretch     Other Standing Exercises  standing on airex for 10 reps fo biceps curls with 2# and 10 reps of shoulder scaption to about 60 degrees with 2# with cues to keep scapulae stabalized       Manual Therapy   Manual Therapy  Scapular mobilization    Scapular Mobilization   inferior glides to left scapula for upper trap release    not complete releif of neck tightness                  PT Long Term Goals - 09/26/19 1408      PT LONG TERM GOAL #1   Title  Pt will have well fiting prophylacitic compression garments for left arm    Time  8    Period  Weeks    Status  On-going      PT LONG TERM GOAL #2   Baseline  09/26/2019: pt reports  the numbness is not bothering her anymore, but she has pain in left arm from yardwork    Status  Achieved      PT LONG TERM GOAL #3   Title  Pt will report stiffness in both knees is decreased by 50%    Time  8    Period  Weeks    Status  On-going      PT LONG TERM GOAL #4   Title  Pt will be able to continue with an aquatic exercise program upon discontinuation of PT    Time  8    Period  Weeks    Status  On-going            Plan - 09/26/19 1402    Clinical Impression Statement  Pt has increased her activity at home and is having some soreness in her neck and left shoulder but fullness at left back appears to be controlled with the body shaper compression pt is wearing.  Upgraded her exercise today and will add it to her [place] she has already for next session.  Pt will be measured for velcro T[place] wrap garment for night to help control her lymphedema.    Personal Factors and Comorbidities  Profession;Time since onset of injury/illness/exacerbation    Examination-Activity Limitations  Caring for Others;Reach Overhead;Carry;Dressing    Examination-Participation Restrictions  Community Activity;Cleaning    Stability/Clinical Decision Making  Stable/Uncomplicated    Clinical Impairments Affecting Rehab Potential  hx of radiation    PT Frequency  2x / week    PT Duration  8 weeks    PT Treatment/Interventions  ADLs/Self Care Home Management;Therapeutic exercise;Patient/family education;Manual techniques;Manual lymph drainage;Compression bandaging;Scar mobilization;Passive range of  motion;Therapeutic activities;Aquatic Therapy;Orthotic Fit/Training    PT Next Visit Plan  check to see if pt got her daytime  compression sleeve from [facility] and her nighttime measured by [facility] on April 23. Teach  HEP [place] Access code  [email or web address]  and issue to patient with modifictions for final home program in addidtion to the other [place] programs she has.  See if she is still going to Aquatics or is ready for discharge. May need to schedule one more visit with [name] if she is still going to aquatics or send email to to them to discharge (?)       Patient will benefit from skilled therapeutic intervention in order to improve the following deficits and impairments:  Decreased range of motion, Impaired UE functional use, Decreased strength, Other (comment), Pain, Postural dysfunction  Visit Diagnosis: Stiffness of left shoulder, not elsewhere classified  Muscle weakness (generalized)  Aftercare following surgery for neoplasm  Chronic pain of right knee  Chronic pain of left knee  At risk for lymphedema  Acute pain of left shoulder     Problem List Patient Active Problem List   Diagnosis Date Noted  . Breast asymmetry following reconstructive surgery 09/20/2019  . Benign essential HTN 08/17/2018  . Morbid obesity with body mass index (BMI) of 40.0 to 49.9 ([place]) 09/17/2017  . 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 Malignant neoplasm of upper-inner quadrant of left breast in female, estrogen receptor positive ([place]) 07/27/2017  . Genetic testing 03/25/2017  . Malignant 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, estrogen receptor positive ([place]) 01/23/2017  . 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 Owens Shark, PT  Norwood Levo 09/26/2019, 4:25 PM  [facility]  [place], [place], [address] Phone: [phone]   Fax:   [phone]  Name: Mackenzie [name] MRN: [id number] Date of Birth: Dec 02, 1970

## 2019-09-28 ENCOUNTER — Other Ambulatory Visit: Payer: Self-pay

## 2019-09-28 ENCOUNTER — Ambulatory Visit: Payer: 59 | Admitting: Physical Therapy

## 2019-09-28 ENCOUNTER — Encounter: Payer: Self-pay | Admitting: Physical Therapy

## 2019-09-28 DIAGNOSIS — M25562 Pain in left knee: Secondary | ICD-10-CM | POA: Diagnosis not present

## 2019-09-28 DIAGNOSIS — Z483 Aftercare following surgery for neoplasm: Secondary | ICD-10-CM | POA: Diagnosis not present

## 2019-09-28 DIAGNOSIS — M25612 Stiffness of left shoulder, not elsewhere classified: Secondary | ICD-10-CM | POA: Diagnosis not present

## 2019-09-28 DIAGNOSIS — M6281 Muscle weakness (generalized): Secondary | ICD-10-CM | POA: Diagnosis not present

## 2019-09-28 DIAGNOSIS — M25561 Pain in right knee: Secondary | ICD-10-CM | POA: Diagnosis not present

## 2019-09-28 DIAGNOSIS — G8929 Other chronic pain: Secondary | ICD-10-CM | POA: Diagnosis not present

## 2019-09-28 DIAGNOSIS — M25512 Pain in left shoulder: Secondary | ICD-10-CM | POA: Diagnosis not present

## 2019-09-28 DIAGNOSIS — M25661 Stiffness of right knee, not elsewhere classified: Secondary | ICD-10-CM | POA: Diagnosis not present

## 2019-09-28 DIAGNOSIS — Z9189 Other specified personal risk factors, not elsewhere classified: Secondary | ICD-10-CM | POA: Diagnosis not present

## 2019-09-28 NOTE — Therapy (Signed)
[facility] [address] [place], [place], [address] Phone: [phone]   Fax:  [phone]  Physical Therapy Treatment  Patient Details  Name: Mackenzie [name] MRN: [id number] Date of Birth: 1970/12/01 Referring Provider (PT): [name]    Encounter Date: 09/28/2019  PT End of Session - 09/28/19 1816    Visit Number  13    Number of Visits  17    Date for PT Re-Evaluation  10/10/19    PT Start Time  1600   pt arrived late for appointment   PT Stop Time  1630    PT Time Calculation (min)  30 min    Equipment Utilized During Treatment  Other (comment)   buoyancy barbells   Activity Tolerance  Patient tolerated treatment well    Behavior During Therapy  [facility] for tasks assessed/performed       Past Medical History:  Diagnosis Date  . Abnormal glucose 2018  . 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 Allergic rhinitis 2012  . Anemia 01/27/2017   prior to starting chemotherapy  . Breast cancer ([place]) 01/14/2017   Left breast  . 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 Edema, lower extremity   . Fatty liver   . Hot flashes 03/2017  . Hyperlipidemia 03/20/2016  . Morbid obesity with body mass index (BMI) of 40.0 to 49.9 ([place]) 09/17/2017  . NCGS (non-celiac gluten sensitivity)   . Pre-diabetes   . Seasonal allergies 2012   seasonal allergies causes allergic rhinitis and itchy, dry eyes per pt  . Vitamin D deficiency 05/2015    Past Surgical History:  Procedure Laterality Date  . BREAST RECONSTRUCTION WITH PLACEMENT OF TISSUE EXPANDER AND FLEX HD (ACELLULAR HYDRATED DERMIS) Left 08/27/2017   Procedure: LEFT BREAST RECONSTRUCTION WITH PLACEMENT OF TISSUE EXPANDER AND FLEX HD;  Surgeon: Wallace Going, DO;  Location: [facility];  Service: Plastics;  Laterality: Left;  . CESAREAN SECTION     x2  . LATISSIMUS FLAP TO BREAST Left 11/03/2018  . LATISSIMUS FLAP TO BREAST  Left 11/03/2018   Procedure: LATISSIMUS FLAP TO LEFT BREAST;  Surgeon: Wallace Going, DO;  Location: [facility];  Service: Plastics;  Laterality: Left;  Marland Kitchen MASTECTOMY MODIFIED RADICAL Left 08/27/2017  . MASTECTOMY MODIFIED RADICAL Left 08/27/2017   Procedure: LEFT MODIFIED RADICAL MASTECTOMY;  Surgeon: Erroll Luna, MD;  Location: [facility];  Service: General;  Laterality: Left;  Marland Kitchen MASTOPEXY Right 03/03/2019   Procedure: RIGHT BREAST MASTOPEXY/REDUCTION;  Surgeon: Wallace Going, DO;  Location: [facility];  Service: Plastics;  Laterality: Right;  3 hours, please  . PORT-A-CATH REMOVAL Right 08/27/2017   Procedure: REMOVAL PORT-A-CATH RIGHT CHEST;  Surgeon: Erroll Luna, MD;  Location: [facility];  Service: General;  Laterality: Right;  . PORTACATH PLACEMENT Right 01/28/2017   Procedure: INSERTION PORT-A-CATH WITH ULTRASOUND;  Surgeon: Erroll Luna, MD;  Location: [facility];  Service: General;  Laterality: Right;  . REMOVAL OF TISSUE EXPANDER AND PLACEMENT OF IMPLANT Left 03/03/2019   Procedure: REMOVAL OF TISSUE EXPANDER AND PLACEMENT OF EXPANDER;  Surgeon: Wallace Going, DO;  Location: [facility];  Service: Plastics;  Laterality: Left;  . REMOVAL OF TISSUE EXPANDER AND PLACEMENT OF IMPLANT Left 05/25/2019   Procedure: LEFT BREAST REMOVAL OF TISSUE EXPANDER AND PLACEMENT OF IMPLANT;  Surgeon: Wallace Going, DO;  Location: [facility];  Service: Plastics;  Laterality: Left;  .  TISSUE EXPANDER PLACEMENT Left 11/03/2018   Procedure: PLACEMENT OF TISSUE EXPANDER LEFT BREAST;  Surgeon: Wallace Going, DO;  Location: [facility];  Service: Plastics;  Laterality: Left;  Total case time is 3.5 hours  . TUBAL LIGATION Bilateral 01/21/2011    There were no vitals filed for this visit.  Subjective Assessment - 09/28/19 1815    Subjective  Denies any changes.  States her knees were a little sore after the last session.    Pertinent  History  Left breast cancer diagnosed 01/10/17 by mammogram and needle biopsy. Then had neo-adjuvant chemo starting 02/10/17 until 06/26/17; had left mastectomy 08/27/17 with ALND and immediate expander placed. Radiation completed. Expander removed and replaced on May 27, 19 b/c it did not get expanded enough prior to radiation, also had lat flap 11/02/17. Had expander removed and replaced again on 03/03/19 due to a small leak, pt had reduction on R breast at this time    Patient Stated Goals  to see if arm is ok, do what she needs to do to get back to exercising so she can do things with her 51 year old    Currently in Pain?  No/denies      Aquatic therapy at [facility] - pool temp 86.7degrees  Patient seen for aquatic therapy today. Treatment took place in water 3.5-4 feet deep depending upon activity. Pt entered and exited the pool via ramp with supervision:   Runners stretch bil LE's x 30 seconds each   Pt performed gait training in pool forwards 9m x 2 repswithout UE support, 33m x 2 working on bil arm swingthen 23m x 2 repsbackwards.  Performed side stepping squatsx 78m x 2 with arm bar bells for increased resistance.Cues for technique/sequence.Forward walking with bil arm barbells directly under water working on opposite shoulder protraction/retraction x 17m x 2.  Performed bil LE marching, hip extension, hip abd, hamstring curl. Pt able to perform 15reps each side alternating sides.  Heel raises x 15.  Pt only needed occasional UE assist with hip extension.  Squats while standing at pool edge for UE support x 10 reps. Single leg squats 10 reps each with UE support.  Ai Chi postures of enclosing, soothing and freeing x 15 reps each.  Pt with occasional lob but recovers without assist.  Standing with back to edge of pool for shoulder extension bil with arm barbells x 15 reps.  Performed shoulder add/abd both horizontal and vertical x 15 reps with same.  Jogging 81m x 2 reps  Pt  requires buoyancy for support with balance and viscosity for resistance for strengthening exercises; buoyancy is also needed for off loading body to assist with exercises.   PT Long Term Goals - 09/26/19 1408      PT LONG TERM GOAL #1   Title  Pt will have well fiting prophylacitic compression garments for left arm    Time  8    Period  Weeks    Status  On-going      PT LONG TERM GOAL #2   Baseline  09/26/2019: pt reports the numbness is not bothering her anymore, but she has pain in left arm from yardwork    Status  Achieved      PT LONG TERM GOAL #3   Title  Pt will report stiffness in both knees is decreased by 50%    Time  8    Period  Weeks    Status  On-going      PT  LONG TERM GOAL #4   Title  Pt will be able to continue with an aquatic exercise program upon discontinuation of PT    Time  8    Period  Weeks    Status  On-going            Plan - 09/28/19 1818    Clinical Impression Statement  Pt continues to progress with balance and endurance with aquatics program along with flexibility.  Did not use ankle cuffs today due to pt c/o some soreness last week.  Pt plans to investigate aquatics at [facility] as a [employer] employee to continue upon d/c from PT.  Plan to finish aquatics PT program next week per POC.    Personal Factors and Comorbidities  Profession;Time since onset of injury/illness/exacerbation    Examination-Activity Limitations  Caring for Others;Reach Overhead;Carry;Dressing;Toileting    Examination-Participation Restrictions  Community Activity;Cleaning    Stability/Clinical Decision Making  Stable/Uncomplicated    Clinical Impairments Affecting Rehab Potential  hx of radiation    PT Frequency  2x / week    PT Duration  8 weeks    PT Treatment/Interventions  ADLs/Self Care Home Management;Therapeutic exercise;Patient/family education;Manual techniques;Manual lymph drainage;Compression bandaging;Scar mobilization;Passive range of motion;Therapeutic  activities;Aquatic Therapy;Orthotic Fit/Training    PT Next Visit Plan  check to see if pt got her daytime  compression sleeve from [facility] and her nighttime measured by [facility] on April 23. Teach  HEP [place] Access code  [email or web address]  and issue to patient with modifictions for final home program in addidtion to the other [place] programs she has.  See if she is still going to Aquatics or is ready for discharge. May need to schedule one more visit with [name] if she is still going to aquatics or send email to to them to discharge (?)       Patient will benefit from skilled therapeutic intervention in order to improve the following deficits and impairments:  Decreased range of motion, Impaired UE functional use, Decreased strength, Other (comment), Pain, Postural dysfunction  Visit Diagnosis: Stiffness of left shoulder, not elsewhere classified  Muscle weakness (generalized)     Problem List Patient Active Problem List   Diagnosis Date Noted  . Breast asymmetry following reconstructive surgery 09/20/2019  . Benign essential HTN 08/17/2018  . Morbid obesity with body mass index (BMI) of 40.0 to 49.9 ([place]) 09/17/2017  . 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 Malignant neoplasm of upper-inner quadrant of left breast in female, estrogen receptor positive ([place]) 07/27/2017  . Genetic testing 03/25/2017  . Malignant 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, estrogen receptor positive ([place]) 01/23/2017  . 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

## 2019-10-05 ENCOUNTER — Encounter: Payer: Self-pay | Admitting: Physical Therapy

## 2019-10-05 ENCOUNTER — Other Ambulatory Visit: Payer: Self-pay

## 2019-10-05 ENCOUNTER — Encounter (INDEPENDENT_AMBULATORY_CARE_PROVIDER_SITE_OTHER): Payer: Self-pay | Admitting: Physician Assistant

## 2019-10-05 ENCOUNTER — Ambulatory Visit (INDEPENDENT_AMBULATORY_CARE_PROVIDER_SITE_OTHER): Payer: 59 | Admitting: Physician Assistant

## 2019-10-05 ENCOUNTER — Ambulatory Visit: Payer: 59 | Admitting: Podiatry

## 2019-10-05 ENCOUNTER — Ambulatory Visit: Payer: 59 | Admitting: Physical Therapy

## 2019-10-05 ENCOUNTER — Encounter: Payer: Self-pay | Admitting: Podiatry

## 2019-10-05 VITALS — BP 112/68 | HR 86 | Temp 97.6°F | Ht 65.0 in | Wt 262.0 lb

## 2019-10-05 DIAGNOSIS — M6281 Muscle weakness (generalized): Secondary | ICD-10-CM

## 2019-10-05 DIAGNOSIS — E119 Type 2 diabetes mellitus without complications: Secondary | ICD-10-CM | POA: Diagnosis not present

## 2019-10-05 DIAGNOSIS — Z483 Aftercare following surgery for neoplasm: Secondary | ICD-10-CM | POA: Diagnosis not present

## 2019-10-05 DIAGNOSIS — Z6841 Body Mass Index (BMI) 40.0 and over, adult: Secondary | ICD-10-CM | POA: Diagnosis not present

## 2019-10-05 DIAGNOSIS — M19079 Primary osteoarthritis, unspecified ankle and foot: Secondary | ICD-10-CM

## 2019-10-05 DIAGNOSIS — M2142 Flat foot [pes planus] (acquired), left foot: Secondary | ICD-10-CM

## 2019-10-05 DIAGNOSIS — Z9189 Other specified personal risk factors, not elsewhere classified: Secondary | ICD-10-CM | POA: Diagnosis not present

## 2019-10-05 DIAGNOSIS — E7849 Other hyperlipidemia: Secondary | ICD-10-CM

## 2019-10-05 DIAGNOSIS — G8929 Other chronic pain: Secondary | ICD-10-CM | POA: Diagnosis not present

## 2019-10-05 DIAGNOSIS — M25661 Stiffness of right knee, not elsewhere classified: Secondary | ICD-10-CM | POA: Diagnosis not present

## 2019-10-05 DIAGNOSIS — M2141 Flat foot [pes planus] (acquired), right foot: Secondary | ICD-10-CM | POA: Diagnosis not present

## 2019-10-05 DIAGNOSIS — M25512 Pain in left shoulder: Secondary | ICD-10-CM | POA: Diagnosis not present

## 2019-10-05 DIAGNOSIS — M25612 Stiffness of left shoulder, not elsewhere classified: Secondary | ICD-10-CM | POA: Diagnosis not present

## 2019-10-05 DIAGNOSIS — M25561 Pain in right knee: Secondary | ICD-10-CM | POA: Diagnosis not present

## 2019-10-05 DIAGNOSIS — M25562 Pain in left knee: Secondary | ICD-10-CM | POA: Diagnosis not present

## 2019-10-05 NOTE — Progress Notes (Signed)
Chief Complaint:   OBESITY Dhani C Benney is here to discuss her progress with her obesity treatment plan along with follow-up of her obesity related diagnoses. Arasely is on the Category 2 Plan and states she is following her eating plan approximately 80% of the time. Juliah states she is exercising 0 minutes 0 times per week.  Today's visit was #: 4 Starting weight: 267 lbs Starting date: 08/09/2019 Today's weight: 262 lbs Today's date: 10/05/2019 Total lbs lost to date: 5 Total lbs lost since last in-office visit: 4  Interim History: Bianney states that she is happy with her weight loss, especially because of the tempting snacks at work. She is getting tired of cooking.  Subjective:   Other hyperlipidemia. Rosamaria is on Omega-3. No chest pain or headache.   Lab Results  Component Value Date   CHOL 191 08/09/2019   HDL 68 08/09/2019   LDLCALC 115 (H) 08/09/2019   TRIG 42 08/09/2019   Lab Results  Component Value Date   ALT 20 08/09/2019   AST 19 08/09/2019   ALKPHOS 157 (H) 08/09/2019   BILITOT 0.4 08/09/2019   The 10-year ASCVD risk score Mikey Bussing DC Jr., et al., 2013) is: 1.9%   Values used to calculate the score:     Age: 49 years     Sex: Female     Is Non-Hispanic African American: Yes     Diabetic: Yes     Tobacco smoker: No     Systolic Blood Pressure: [id number] mmHg     Is BP treated: No     HDL Cholesterol: 68 mg/dL     Total Cholesterol: 191 mg/dL  Assessment/Plan:   Other hyperlipidemia. Cardiovascular risk and specific lipid/LDL goals reviewed.  We discussed several lifestyle modifications today and Tressia will continue to work on diet, exercise and weight loss efforts. Orders and follow up as documented in patient record. She will continue her OTC medication as directed.  Counseling Intensive lifestyle modifications are the first line treatment for this issue. . Dietary changes: Increase soluble fiber. Decrease simple carbohydrates. . Exercise  changes: Moderate to vigorous-intensity aerobic activity 150 minutes per week if tolerated. . Lipid-lowering medications: see documented in medical record.  Class 3 severe obesity with serious comorbidity and body mass index (BMI) of 40.0 to 44.9 in adult, unspecified obesity type ([place]).  Leilanee is currently in the action stage of change. As such, her goal is to continue with weight loss efforts. She has agreed to the Category 2 Plan.   Exercise goals: For substantial health benefits, adults should do at least 150 minutes (2 hours and 30 minutes) a week of moderate-intensity, or 75 minutes (1 hour and 15 minutes) a week of vigorous-intensity aerobic physical activity, or an equivalent combination of moderate- and vigorous-intensity aerobic activity. Aerobic activity should be performed in episodes of at least 10 minutes, and preferably, it should be spread throughout the week.  Behavioral modification strategies: meal planning and cooking strategies and dealing with family or coworker sabotage.  Abbygayle has agreed to follow-up with our clinic in 2 weeks. She was informed of the importance of frequent follow-up visits to maximize her success with intensive lifestyle modifications for her multiple health conditions.   Objective:   Blood pressure 112/68, pulse 86, temperature 97.6 F (36.4 C), temperature source Oral, height 5\' 5"  (1.651 m), weight 262 lb (118.8 kg), last menstrual period 02/10/2017, SpO2 97 %, unknown if currently breastfeeding. Body mass index is 43.6 kg/m.  General: Cooperative, alert, well developed, in no acute distress. HEENT: Conjunctivae and lids unremarkable. Cardiovascular: Regular rhythm.  Lungs: Normal work of breathing. Neurologic: No focal deficits.   Lab Results  Component Value Date   CREATININE 0.88 08/09/2019   BUN 13 08/09/2019   NA 136 08/09/2019   K 4.3 08/09/2019   CL 100 08/09/2019   CO2 25 08/09/2019   Lab Results  Component Value Date   ALT  20 08/09/2019   AST 19 08/09/2019   ALKPHOS 157 (H) 08/09/2019   BILITOT 0.4 08/09/2019   Lab Results  Component Value Date   HGBA1C 5.9 (H) 08/09/2019   HGBA1C 5.9 (H) 07/26/2018   Lab Results  Component Value Date   INSULIN 12.6 08/09/2019   Lab Results  Component Value Date   TSH 0.657 08/09/2019   Lab Results  Component Value Date   CHOL 191 08/09/2019   HDL 68 08/09/2019   LDLCALC 115 (H) 08/09/2019   TRIG 42 08/09/2019   Lab Results  Component Value Date   WBC 6.0 08/09/2019   HGB 12.7 08/09/2019   HCT 38.1 08/09/2019   MCV 87 08/09/2019   PLT 327 08/09/2019   No results found for: IRON, TIBC, FERRITIN  appointment today.  Attestation Statements:   Reviewed by clinician on day of visit: allergies, medications, problem list, medical history, surgical history, family history, social history, and previous encounter notes.  Time spent on visit including pre-visit chart review and post-visit charting and care was 30 minutes.   IMichaelene Song, am acting as transcriptionist for Abby Potash, PA-C   I have reviewed the above documentation for accuracy and completeness, and I agree with the above. Abby Potash, PA-C

## 2019-10-05 NOTE — Patient Instructions (Signed)
Access Code: [email or web address]: [email or web address]: 04/28/2021Prepared by: Langley Gauss RobertsonExercises  Forward Walking - 2 x weekly - 1 reps  Backward Walking - 2 x weekly  Side Stepping - 2 x weekly  Forward and Backward Walking with Hand Floats - 2 x weekly  Squat - 2 x weekly - 10 reps  Standing March at [facility] - 2 x weekly - 10 reps  Standing Hip Abduction Adduction at [employer] - 2 x weekly - 10 reps  Standing Hip Flexion Extension at [employer] - 2 x weekly - 10 reps  Heel Toe Raises at Pool Wall - 2 x weekly - 10 reps  Bilateral Shoulder Flexion Extension with Hand Floats at [employer] - 2 x weekly - 10 reps  Soothing - 1 x daily - 2 x weekly - 10 reps  Enclosing - 1 x daily - 2 x weekly - 10 reps  Forward Jog in [place] - 2 x weekly  Backward Jog in [place] - 2 x weekly

## 2019-10-05 NOTE — Therapy (Signed)
[facility] [address] [place], [place], [address] Phone: [phone]   Fax:  [phone]  Physical Therapy Treatment  Patient Details  Name: Mackenzie [name] MRN: [id number] Date of Birth: 12-Mar-1971 Referring Provider (PT): [name]    Encounter Date: 10/05/2019  PT End of Session - 10/05/19 1743    Visit Number  14    Number of Visits  17    Date for PT Re-Evaluation  10/10/19    PT Start Time  1500    PT Stop Time  1545    PT Time Calculation (min)  45 min    Equipment Utilized During Treatment  Other (comment)   buoyancy barbells, buoyancy cuffs   Activity Tolerance  Patient tolerated treatment well    Behavior During Therapy  [facility] for tasks assessed/performed       Past Medical History:  Diagnosis Date  . Abnormal glucose 2018  . 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 Allergic rhinitis 2012  . Anemia 01/27/2017   prior to starting chemotherapy  . Breast cancer ([place]) 01/14/2017   Left breast  . 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 Edema, lower extremity   . Fatty liver   . Hot flashes 03/2017  . Hyperlipidemia 03/20/2016  . Morbid obesity with body mass index (BMI) of 40.0 to 49.9 ([place]) 09/17/2017  . NCGS (non-celiac gluten sensitivity)   . Pre-diabetes   . Seasonal allergies 2012   seasonal allergies causes allergic rhinitis and itchy, dry eyes per pt  . Vitamin D deficiency 05/2015    Past Surgical History:  Procedure Laterality Date  . BREAST RECONSTRUCTION WITH PLACEMENT OF TISSUE EXPANDER AND FLEX HD (ACELLULAR HYDRATED DERMIS) Left 08/27/2017   Procedure: LEFT BREAST RECONSTRUCTION WITH PLACEMENT OF TISSUE EXPANDER AND FLEX HD;  Surgeon: Wallace Going, DO;  Location: [facility];  Service: Plastics;  Laterality: Left;  . CESAREAN SECTION     x2  . LATISSIMUS FLAP TO BREAST Left 11/03/2018  . LATISSIMUS FLAP TO BREAST Left 11/03/2018   Procedure: LATISSIMUS FLAP TO LEFT BREAST;  Surgeon: Wallace Going, DO;  Location: [facility];  Service: Plastics;  Laterality: Left;  Marland Kitchen MASTECTOMY MODIFIED RADICAL Left 08/27/2017  . MASTECTOMY MODIFIED RADICAL Left 08/27/2017   Procedure: LEFT MODIFIED RADICAL MASTECTOMY;  Surgeon: Erroll Luna, MD;  Location: [facility];  Service: General;  Laterality: Left;  Marland Kitchen MASTOPEXY Right 03/03/2019   Procedure: RIGHT BREAST MASTOPEXY/REDUCTION;  Surgeon: Wallace Going, DO;  Location: [facility];  Service: Plastics;  Laterality: Right;  3 hours, please  . PORT-A-CATH REMOVAL Right 08/27/2017   Procedure: REMOVAL PORT-A-CATH RIGHT CHEST;  Surgeon: Erroll Luna, MD;  Location: [facility];  Service: General;  Laterality: Right;  . PORTACATH PLACEMENT Right 01/28/2017   Procedure: INSERTION PORT-A-CATH WITH ULTRASOUND;  Surgeon: Erroll Luna, MD;  Location: [facility];  Service: General;  Laterality: Right;  . REMOVAL OF TISSUE EXPANDER AND PLACEMENT OF IMPLANT Left 03/03/2019   Procedure: REMOVAL OF TISSUE EXPANDER AND PLACEMENT OF EXPANDER;  Surgeon: Wallace Going, DO;  Location: [facility];  Service: Plastics;  Laterality: Left;  . REMOVAL OF TISSUE EXPANDER AND PLACEMENT OF IMPLANT Left 05/25/2019   Procedure: LEFT BREAST REMOVAL OF TISSUE EXPANDER AND PLACEMENT OF IMPLANT;  Surgeon: Wallace Going, DO;  Location: [place];  Service: Plastics;  Laterality: Left;  . TISSUE EXPANDER PLACEMENT Left  11/03/2018   Procedure: PLACEMENT OF TISSUE EXPANDER LEFT BREAST;  Surgeon: Wallace Going, DO;  Location: [facility];  Service: Plastics;  Laterality: Left;  Total case time is 3.5 hours  . TUBAL LIGATION Bilateral 01/21/2011    There were no vitals filed for this visit.  Subjective Assessment - 10/05/19 1741    Subjective  Checked into joining [facility] for access to pool and water classes.  Has an appointment scheduled with personal  trainer through [place].    Pertinent History  Left breast cancer diagnosed 01/10/17 by mammogram and needle biopsy. Then had neo-adjuvant chemo starting 02/10/17 until 06/26/17; had left mastectomy 08/27/17 with ALND and immediate expander placed. Radiation completed. Expander removed and replaced on May 27, 19 b/c it did not get expanded enough prior to radiation, also had lat flap 11/02/17. Had expander removed and replaced again on 03/03/19 due to a small leak, pt had reduction on R breast at this time    Patient Stated Goals  to see if arm is ok, do what she needs to do to get back to exercising so she can do things with her 49 year old    Currently in Pain?  No/denies       Aquatic therapy at [facility] - pool temp 86.7degrees  Patient seen for aquatic therapy today. Treatment took place in water 3.5-4 feet deep depending upon activity. Pt entered and exited the pool viaramp withsupervision:   Runners stretch bil LE's x 30 seconds each and toes up wall x 30 sec  Pt performed gait training in pool forwards 72m x 2 repswithout UE support, 62m x 2 working on bil arm swingand marching then 43m x 2 repsbackwards marching  Performed side stepping squatsx 47m x 2 with arm bar bells for increased resistance.Cues for technique/sequence.Forward walking with bil arm barbells directly under water working on opposite shoulder protraction/retraction x 58m x 2.  Performed bil LE marching, hip extension, hip abd, hamstring curl. Used buoyancy cuffs for all exercises.  Pt able to perform 15reps each sidealternating sides. Heel raises x 15.  Pt only needed occasional UE assist with hip extension.  Squats while standing at pool edge for UE support x 10 reps.Single leg squats 10 reps each with UE support.  Ai Chi postures of enclosing, soothing and freeing x 15 reps each.  Pt with occasional lob but recovers without assist.  Squat position with back against wall for single knee to chest x 30  each side.  Standing with back to edge of pool for shoulder extension bil with arm barbells x 15 reps.  Performed shoulder flexion/extension, shoulder add/abd both horizontal and vertical x 10 reps with same.  Jogging 82m x 4 reps  Pt requires buoyancy for support with balance and viscosity for resistance for strengthening exercises; buoyancy is also needed for off loading body to assist with exercises.    PT Education - 10/05/19 1742    Education Details  Aquatics HEP    Person(s) Educated  Patient    Methods  Explanation;Handout   handout to be provided at last session due to no access to printer at pool   Comprehension  Verbalized understanding;Returned demonstration          PT Long Term Goals - 10/05/19 1750      PT LONG TERM GOAL #1   Title  Pt will have well fiting prophylacitic compression garments for left arm    Time  8    Period  Weeks  Status  On-going      PT LONG TERM GOAL #2   Baseline  09/26/2019: pt reports the numbness is not bothering her anymore, but she has pain in left arm from yardwork    Status  Achieved      PT LONG TERM GOAL #3   Title  Pt will report stiffness in both knees is decreased by 50%    Time  8    Period  Weeks    Status  On-going      PT LONG TERM GOAL #4   Title  Pt will be able to continue with an aquatic exercise program upon discontinuation of PT    Time  8    Period  Weeks    Status  Achieved            Plan - 10/05/19 1744    Clinical Impression Statement  Pt has tolerated aquatics sessions well and progressed with endurance and balance.  Pt plans to [facility] aquatics [facility]  Developed [employer] HEP for [employer] and educated pt to have PT provide a copy on her last session due ot not having access to a printer at pool today.  Per Primary PT, possible d/c from all therapy at next session.  Today was last aquatic session scheduled.    Personal Factors and Comorbidities  Profession;Time since  onset of injury/illness/exacerbation    Examination-Activity Limitations  Caring for Others;Reach Overhead;Carry;Dressing;Toileting    Examination-Participation Restrictions  Community Activity;Cleaning    Stability/Clinical Decision Making  Stable/Uncomplicated    Clinical Impairments Affecting Rehab Potential  hx of radiation    PT Frequency  2x / week    PT Duration  8 weeks    PT Treatment/Interventions  ADLs/Self Care Home Management;Therapeutic exercise;Patient/family education;Manual techniques;Manual lymph drainage;Compression bandaging;Scar mobilization;Passive range of motion;Therapeutic activities;Aquatic Therapy;Orthotic Fit/Training    PT Next Visit Plan  Teresa-we finished her last aquatic session today.  Can you please provide her a handout of the [place] program I created for her.  I did a separate code from the HEP you developed.  check to see if pt got her daytime  compression sleeve from [facility] and her nighttime measured by [facility] on April 23. Teach  HEP [place] Access code  [email or web address]  and issue to patient with modifictions for final home program in addidtion to the other [place] programs she has.  See if she is ready for discharge.    PT Home Exercise Plan  Access Code: [email or web address]: [email or web address]: 04/28/2021Prepared by: Nita Sells    Consulted and Agree with Plan of Care  Patient       Patient will benefit from skilled therapeutic intervention in order to improve the following deficits and impairments:  Decreased range of motion, Impaired UE functional use, Decreased strength, Other (comment), Pain, Postural dysfunction  Visit Diagnosis: Stiffness of left shoulder, not elsewhere classified  Muscle weakness (generalized)     Problem List Patient Active Problem List   Diagnosis Date Noted  . Breast asymmetry following reconstructive surgery 09/20/2019  . Benign essential HTN 08/17/2018  . Morbid obesity with body mass index  (BMI) of 40.0 to 49.9 ([place]) 09/17/2017  . 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 Malignant neoplasm of upper-inner quadrant of left breast in female, estrogen receptor positive ([place]) 07/27/2017  . Genetic testing 03/25/2017  . 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 Carcinoma of  breast 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, left ([place]) 01/23/2017    Narda Bonds, PTA [place] 10/05/19 5:55 PM Phone: [phone] Fax: [facility] [address] [place], [place], [address] Phone: [phone]   Fax:  [phone]  Name: RASHELE NIBARGER MRN: [id number] Date of Birth: 04-23-1971

## 2019-10-06 ENCOUNTER — Encounter: Payer: Self-pay | Admitting: *Deleted

## 2019-10-07 ENCOUNTER — Other Ambulatory Visit: Payer: Self-pay

## 2019-10-07 ENCOUNTER — Ambulatory Visit: Payer: 59

## 2019-10-07 DIAGNOSIS — M6281 Muscle weakness (generalized): Secondary | ICD-10-CM | POA: Diagnosis not present

## 2019-10-07 DIAGNOSIS — M25561 Pain in right knee: Secondary | ICD-10-CM

## 2019-10-07 DIAGNOSIS — R252 Cramp and spasm: Secondary | ICD-10-CM

## 2019-10-07 DIAGNOSIS — G8929 Other chronic pain: Secondary | ICD-10-CM

## 2019-10-07 DIAGNOSIS — M25661 Stiffness of right knee, not elsewhere classified: Secondary | ICD-10-CM | POA: Diagnosis not present

## 2019-10-07 DIAGNOSIS — M25512 Pain in left shoulder: Secondary | ICD-10-CM

## 2019-10-07 DIAGNOSIS — M25562 Pain in left knee: Secondary | ICD-10-CM | POA: Diagnosis not present

## 2019-10-07 DIAGNOSIS — Z9189 Other specified personal risk factors, not elsewhere classified: Secondary | ICD-10-CM | POA: Diagnosis not present

## 2019-10-07 DIAGNOSIS — M25612 Stiffness of left shoulder, not elsewhere classified: Secondary | ICD-10-CM | POA: Diagnosis not present

## 2019-10-07 DIAGNOSIS — Z483 Aftercare following surgery for neoplasm: Secondary | ICD-10-CM

## 2019-10-07 DIAGNOSIS — R278 Other lack of coordination: Secondary | ICD-10-CM

## 2019-10-07 DIAGNOSIS — M25662 Stiffness of left knee, not elsewhere classified: Secondary | ICD-10-CM

## 2019-10-07 NOTE — Progress Notes (Signed)
   Subjective:  49 y.o. female presenting today as a new patient with a chief complaint of intermittent throbbing, burning and soreness of the bilateral feet onset 3-4 years ago. She reports some intermittent numbness and tingling of the feet. She has been using inserts for treatment but states they are no longer working. Being on the feet for long periods of time increases the pain. Patient is here for further evaluation and treatment.   Past Medical History:  Diagnosis Date  . Abnormal glucose 2018  . 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 Allergic rhinitis 2012  . Anemia 01/27/2017   prior to starting chemotherapy  . Breast cancer ([place]) 01/14/2017   Left breast  . 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 Edema, lower extremity   . Hot flashes 03/2017  . Hyperlipidemia 03/20/2016  . Morbid obesity with body mass index (BMI) of 40.0 to 49.9 ([place]) 09/17/2017  . NCGS (non-celiac gluten sensitivity)   . Pre-diabetes   . Seasonal allergies 2012   seasonal allergies causes allergic rhinitis and itchy, dry eyes per pt  . Vitamin D deficiency 05/2015       Objective/Physical Exam General: The patient is alert and oriented x3 in no acute distress.  Dermatology: Skin is warm, dry and supple bilateral lower extremities. Negative for open lesions or macerations.  Vascular: Palpable pedal pulses bilaterally. No edema or erythema noted. Capillary refill within normal limits.  Neurological: Epicritic and protective threshold grossly intact bilaterally.   Musculoskeletal Exam: Range of motion within normal limits to all pedal and ankle joints bilateral. Muscle strength 5/5 in all groups bilateral.  Upon weightbearing there is a medial longitudinal arch collapse bilaterally. Remove foot valgus noted to the bilateral lower extremities with excessive pronation upon mid stance.  Assessment: 1. pes planus bilateral 2. DJD bilateral feet 3. T2DM -  controlled    Plan of Care:  1. Patient was evaluated. 2. Appointment with Pedorthist for custom molded orthotics.  3. Recommended good shoe gear.  4. Return to clinic as needed.   RN at [employer].    Edrick Kins, DPM [facility]  Dr. Edrick Kins, [place]                                        [place], [place] [address]                Office [phone]  Fax [phone]

## 2019-10-07 NOTE — Patient Instructions (Signed)
Access Code: [email or web address] URL: [email or web address] Date: 10/07/2019 Prepared by: Tomma Rakers  Program Notes Do these leaning forward on forearms or hands. Keep abdominals engaged with shoulders pulled away from ears.  Move slowly and controlled feeling work in standing leg as well as the one that is moving.    Exercises Standing Hip Abduction with Counter Support - 3 x weekly - 3 sets - 10 reps Standing March with Counter Support - 3 x weekly - 3 sets - 10 reps Standing Hip Extension with Counter Support - 1 x daily - 3 x weekly - 3 sets - 10 reps Standing 4-Way Leg Reach with Counter Support - 1 x daily - 3 x weekly - 3 sets - 10 reps - just do 2 sets for now and work up to 3 sets. work up to 3 sets Standing Knee Flexion with Counter Support - 1 x daily - 3 x weekly - 3 sets - 10 reps

## 2019-10-07 NOTE — Therapy (Signed)
[place], [place], [address] Phone: [phone]   Fax:  [phone]  Physical Therapy Discharge Note  Patient Details  Name: Mackenzie [name] MRN: [id number] Date of Birth: 1970-11-27 Referring Provider (PT): [name]    Encounter Date: 10/07/2019  PT End of Session - 10/07/19 0831    Visit Number  15    Number of Visits  17    Date for PT Re-Evaluation  10/10/19    PT Start Time  0825    PT Stop Time  0903    PT Time Calculation (min)  38 min    Activity Tolerance  Patient tolerated treatment well    Behavior During Therapy  [facility] for tasks assessed/performed       Past Medical History:  Diagnosis Date  . Abnormal glucose 2018  . 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 Allergic rhinitis 2012  . Anemia 01/27/2017   prior to starting chemotherapy  . Breast cancer ([place]) 01/14/2017   Left breast  . 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 Edema, lower extremity   . Hot flashes 03/2017  . Hyperlipidemia 03/20/2016  . Morbid obesity with body mass index (BMI) of 40.0 to 49.9 ([place]) 09/17/2017  . NCGS (non-celiac gluten sensitivity)   . Pre-diabetes   . Seasonal allergies 2012   seasonal allergies causes allergic rhinitis and itchy, dry eyes per pt  . Vitamin D deficiency 05/2015    Past Surgical History:  Procedure Laterality Date  . BREAST RECONSTRUCTION WITH PLACEMENT OF TISSUE EXPANDER AND FLEX HD (ACELLULAR HYDRATED DERMIS) Left 08/27/2017   Procedure: LEFT BREAST RECONSTRUCTION WITH PLACEMENT OF TISSUE EXPANDER AND FLEX HD;  Surgeon: Wallace Going, DO;  Location: [place];  Service: Plastics;  Laterality: Left;  . CESAREAN SECTION     x2  . LATISSIMUS FLAP TO BREAST Left 11/03/2018  . LATISSIMUS FLAP TO BREAST Left 11/03/2018   Procedure: LATISSIMUS FLAP TO LEFT BREAST;  Surgeon: Wallace Going, DO;  Location: [place];  Service:  Plastics;  Laterality: Left;  Marland Kitchen MASTECTOMY MODIFIED RADICAL Left 08/27/2017  . MASTECTOMY MODIFIED RADICAL Left 08/27/2017   Procedure: LEFT MODIFIED RADICAL MASTECTOMY;  Surgeon: Erroll Luna, MD;  Location: [place];  Service: General;  Laterality: Left;  Marland Kitchen MASTOPEXY Right 03/03/2019   Procedure: RIGHT BREAST MASTOPEXY/REDUCTION;  Surgeon: Wallace Going, DO;  Location: [place];  Service: Plastics;  Laterality: Right;  3 hours, please  . PORT-A-CATH REMOVAL Right 08/27/2017   Procedure: REMOVAL PORT-A-CATH RIGHT CHEST;  Surgeon: Erroll Luna, MD;  Location: [place];  Service: General;  Laterality: Right;  . PORTACATH PLACEMENT Right 01/28/2017   Procedure: INSERTION PORT-A-CATH WITH ULTRASOUND;  Surgeon: Erroll Luna, MD;  Location: [place];  Service: General;  Laterality: Right;  . REMOVAL OF TISSUE EXPANDER AND PLACEMENT OF IMPLANT Left 03/03/2019   Procedure: REMOVAL OF TISSUE EXPANDER AND PLACEMENT OF EXPANDER;  Surgeon: Wallace Going, DO;  Location: [place];  Service: Plastics;  Laterality: Left;  . REMOVAL OF TISSUE EXPANDER AND PLACEMENT OF IMPLANT Left 05/25/2019   Procedure: LEFT BREAST REMOVAL OF TISSUE EXPANDER AND PLACEMENT OF IMPLANT;  Surgeon: Wallace Going, DO;  Location: [place];  Service: Plastics;  Laterality: Left;  . TISSUE EXPANDER PLACEMENT Left 11/03/2018   Procedure: PLACEMENT OF TISSUE EXPANDER LEFT BREAST;  Surgeon: Wallace Going, DO;  Location: [facility]  OR;  Service: Clinical cytogeneticist;  Laterality: Left;  Total case time is 3.5 hours  . TUBAL LIGATION Bilateral 01/21/2011    There were no vitals filed for this visit.  Subjective Assessment - 10/07/19 0825    Subjective  Pt states that she was a little late today becuase her 51 year old was having a rough morning. She states that she finished her aquatic therapy and she feels okay about being discharged today.    Pertinent History  Left  breast cancer diagnosed 01/10/17 by mammogram and needle biopsy. Then had neo-adjuvant chemo starting 02/10/17 until 06/26/17; had left mastectomy 08/27/17 with ALND and immediate expander placed. Radiation completed. Expander removed and replaced on May 27, 19 b/c it did not get expanded enough prior to radiation, also had lat flap 11/02/17. Had expander removed and replaced again on 03/03/19 due to a small leak, pt had reduction on R breast at this time    Patient Stated Goals  to see if arm is ok, do what she needs to do to get back to exercising so she can do things with her 51 year old    Currently in Pain?  No/denies    Pain Score  0-No pain                       [facility] Adult PT Treatment/Exercise - 10/07/19 0001      Lumbar Exercises: Standing   Other Standing Lumbar Exercises  standing hip abduction at the counter 3x 10 bil with pt reporting fatigue and muscle work. Standing hip extension 3x 10 bil w/VC and demonstration for tight core to prevent compression at the low back. 4 way hip with demonstration and VC for correct movement. Pt fatigues quickly only 2 sets were performed of 10 repetitions each due to fatigue. Pt was educated that she will just do 2 sets at home and work up to 2 sets.     Other Standing Lumbar Exercises  Standing marching at the counter 3x 10 alt on each leg with VC for core activation to prevent excessive hip sway. Standing knee flexion 3x 10 bil with demonstration and VC to avoid pain.              PT Education - 10/07/19 0907    Education Details  Pt will perform her HEP and aquatics HEP 3-4x/week. she will continue to work on exercises for 6-8 weeks until she is able to find other activities that she enjoys at the same frequency during the week.    Person(s) Educated  Patient    Methods  Explanation;Handout;Demonstration;Verbal cues    Comprehension  Verbalized understanding;Returned demonstration          PT Long Term Goals - 10/07/19 0831       PT LONG TERM GOAL #1   Title  Pt will have well fiting prophylacitic compression garments for left arm    Baseline  pt is waiting on her compression sleeve and night garment but has been measured and ordered.    Status  Partially Met      PT LONG TERM GOAL #2   Title  Pt will report the numbness in her left arm is decreased by 50%    Baseline  Pt reports 90% improvement in numbness in her arm.    Status  Achieved      PT LONG TERM GOAL #3   Title  Pt will report stiffness in both knees is decreased by 50%  Baseline  Pt reports 60% improvement in pain stiffness.    Status  Achieved      PT LONG TERM GOAL #4   Title  Pt will be able to continue with an aquatic exercise program upon discontinuation of PT    Baseline  Pt has finished her aquatic program.    Status  Achieved      PT LONG TERM GOAL #5   Title  Pt will report a 50% improvement in back pain in area of lat flap when walking to allow improved comfort    Baseline  pt reports 75% improvement    Status  Achieved            Plan - 10/07/19 0824    Clinical Impression Statement  Pt has met most of her goals and is reporting greater than 60% improvement in her knee and low back since beginning physical therapy. She has been measured for and ordered compression garments but has not yet received them. Pt has finished an Automotive engineer and is competent in performing a home exercise program that she was educated on today. Pt will be discharged at this time.    Personal Factors and Comorbidities  Profession;Time since onset of injury/illness/exacerbation    Examination-Activity Limitations  Caring for Others;Reach Overhead;Carry;Dressing;Toileting    Examination-Participation Restrictions  Community Activity;Cleaning    Rehab Potential  Good    Clinical Impairments Affecting Rehab Potential  hx of radiation    PT Frequency  2x / week    PT Duration  8 weeks    PT Treatment/Interventions  ADLs/Self Care Home  Management;Therapeutic exercise;Patient/family education;Manual techniques;Manual lymph drainage;Compression bandaging;Scar mobilization;Passive range of motion;Therapeutic activities;Aquatic Therapy;Orthotic Fit/Training    PT Next Visit Plan  Pt will be discharged at this time    PT Home Exercise Plan  Access Code: [email or web address]: [email or web address]: 04/28/2021Prepared by: Langley Gauss RobertsonAccess code  [email or web address]    Recommended Other Services  Measured for night garment by Lenna Sciara on 09/30/19    Consulted and Agree with Plan of Care  Patient       Patient will benefit from skilled therapeutic intervention in order to improve the following deficits and impairments:  Decreased range of motion, Impaired UE functional use, Decreased strength, Other (comment), Pain, Postural dysfunction  Visit Diagnosis: Stiffness of left shoulder, not elsewhere classified  Muscle weakness (generalized)  Aftercare following surgery for neoplasm  Chronic pain of right knee  Chronic pain of left knee  At risk for lymphedema  Acute pain of left shoulder  Stiffness of knee joint, right  Stiffness of knee joint, left  Cramp and spasm  Other lack of coordination     Problem List Patient Active Problem List   Diagnosis Date Noted  . Breast asymmetry following reconstructive surgery 09/20/2019  . Benign essential HTN 08/17/2018  . Morbid obesity with body mass index (BMI) of 40.0 to 49.9 ([place]) 09/17/2017  . 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 Malignant neoplasm of upper-inner quadrant of left breast in female, estrogen receptor positive ([place]) 07/27/2017  . Genetic testing 03/25/2017  . Malignant 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, estrogen receptor positive ([place]) 01/23/2017  . 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  Patient goals were  partially met. Patient is being discharged due to being pleased with the current functional level.  ?????       Gretel Acre  Adam Phenix, [place] 10/07/2019, 9:08 AM  [place], [place], [address] Phone: [phone]   Fax:  [phone]  Name: Mackenzie [name] MRN: [id number] Date of Birth: September 28, 1970

## 2019-10-11 ENCOUNTER — Other Ambulatory Visit: Payer: Self-pay | Admitting: *Deleted

## 2019-10-11 DIAGNOSIS — C50812 Malignant neoplasm of overlapping sites of left female breast: Secondary | ICD-10-CM

## 2019-10-11 DIAGNOSIS — Z170 Estrogen receptor positive status [ER+]: Secondary | ICD-10-CM

## 2019-10-12 ENCOUNTER — Ambulatory Visit
Admission: RE | Admit: 2019-10-12 | Discharge: 2019-10-12 | Disposition: A | Discharge status: Home / Self Care | Payer: 59 | Source: Ambulatory Visit | Attending: Adult Health | Admitting: Adult Health

## 2019-10-12 ENCOUNTER — Other Ambulatory Visit: Payer: Self-pay

## 2019-10-12 DIAGNOSIS — Z1231 Encounter for screening mammogram for malignant neoplasm of breast: Secondary | ICD-10-CM | POA: Diagnosis not present

## 2019-10-12 DIAGNOSIS — C50912 Malignant neoplasm of unspecified site of left female breast: Secondary | ICD-10-CM

## 2019-10-13 ENCOUNTER — Telehealth: Payer: Self-pay | Admitting: *Deleted

## 2019-10-13 NOTE — Telephone Encounter (Signed)
[name]- LAB for Cycle 19- LVM for patient reminding her to fast for at least 8 hrs prior to her lab appointment tomorrow morning.  Reminded patient to not take Letrozole until after her lab appointment.  Asked patient to call back if any problem with this appt or any questions.  Otherwise will plan to see her in the morning. Thanked patient for her time.  Foye Spurling, BSN, RN Clinical Research Nurse 10/13/2019 3:36 PM

## 2019-10-14 ENCOUNTER — Encounter: Payer: Self-pay | Admitting: *Deleted

## 2019-10-14 ENCOUNTER — Inpatient Hospital Stay: Payer: 59 | Attending: Adult Health

## 2019-10-14 ENCOUNTER — Other Ambulatory Visit: Payer: Self-pay

## 2019-10-14 DIAGNOSIS — C50812 Malignant neoplasm of overlapping sites of left female breast: Secondary | ICD-10-CM | POA: Insufficient documentation

## 2019-10-14 DIAGNOSIS — Z9012 Acquired absence of left breast and nipple: Secondary | ICD-10-CM | POA: Insufficient documentation

## 2019-10-14 DIAGNOSIS — R7402 Elevation of levels of lactic acid dehydrogenase (LDH): Secondary | ICD-10-CM | POA: Insufficient documentation

## 2019-10-14 DIAGNOSIS — Z006 Encounter for examination for normal comparison and control in clinical research program: Secondary | ICD-10-CM | POA: Insufficient documentation

## 2019-10-14 DIAGNOSIS — Z79811 Long term (current) use of aromatase inhibitors: Secondary | ICD-10-CM | POA: Insufficient documentation

## 2019-10-14 DIAGNOSIS — Z170 Estrogen receptor positive status [ER+]: Secondary | ICD-10-CM

## 2019-10-14 DIAGNOSIS — R748 Abnormal levels of other serum enzymes: Secondary | ICD-10-CM | POA: Insufficient documentation

## 2019-10-14 DIAGNOSIS — Z5111 Encounter for antineoplastic chemotherapy: Secondary | ICD-10-CM | POA: Insufficient documentation

## 2019-10-14 LAB — CMP (CANCER CENTER ONLY)
ALT: 35 U/L (ref 0–44)
AST: 28 U/L (ref 15–41)
Albumin: 3.9 g/dL (ref 3.5–5.0)
Alkaline Phosphatase: 131 U/L — ABNORMAL HIGH (ref 38–126)
Anion gap: 10 (ref 5–15)
BUN: 19 mg/dL (ref 6–20)
CO2: 24 mmol/L (ref 22–32)
Calcium: 9.5 mg/dL (ref 8.9–10.3)
Chloride: 103 mmol/L (ref 98–111)
Creatinine: 0.99 mg/dL (ref 0.44–1.00)
GFR, Est AFR Am: >60 mL/min (ref >60)
GFR, Estimated: >60 mL/min (ref >60)
Glucose, Bld: 91 mg/dL (ref 70–99)
Potassium: 4 mmol/L (ref 3.5–5.1)
Sodium: 137 mmol/L (ref 135–145)
Total Bilirubin: 0.4 mg/dL (ref 0.3–1.2)
Total Protein: 8 g/dL (ref 6.5–8.1)

## 2019-10-14 LAB — CBC WITH DIFFERENTIAL (CANCER CENTER ONLY)
Abs Immature Granulocytes: 0.01 10*3/uL (ref 0.00–0.07)
Basophils Absolute: 0 10*3/uL (ref 0.0–0.1)
Basophils Relative: 1 %
Eosinophils Absolute: 0 10*3/uL (ref 0.0–0.5)
Eosinophils Relative: 1 %
HCT: 38.1 % (ref 36.0–46.0)
Hemoglobin: 12.5 g/dL (ref 12.0–15.0)
Immature Granulocytes: 0 %
Lymphocytes Relative: 26 %
Lymphs Abs: 1.2 10*3/uL (ref 0.7–4.0)
MCH: 29.4 pg (ref 26.0–34.0)
MCHC: 32.8 g/dL (ref 30.0–36.0)
MCV: 89.6 fL (ref 80.0–100.0)
Monocytes Absolute: 0.6 10*3/uL (ref 0.1–1.0)
Monocytes Relative: 14 %
Neutro Abs: 2.6 10*3/uL (ref 1.7–7.7)
Neutrophils Relative %: 58 %
Platelet Count: 201 10*3/uL (ref 150–400)
RBC: 4.25 MIL/uL (ref 3.87–5.11)
RDW: 13.8 % (ref 11.5–15.5)
WBC Count: 4.5 10*3/uL (ref 4.0–10.5)
nRBC: 0 % (ref 0.0–0.2)

## 2019-10-14 LAB — LIPASE, BLOOD: Lipase: 24 U/L (ref 11–51)

## 2019-10-14 LAB — MAGNESIUM: Magnesium: 1.8 mg/dL (ref 1.7–2.4)

## 2019-10-14 LAB — PHOSPHORUS: Phosphorus: 3.7 mg/dL (ref 2.5–4.6)

## 2019-10-14 LAB — LACTATE DEHYDROGENASE: LDH: 200 U/L — ABNORMAL HIGH (ref 98–192)

## 2019-10-14 LAB — GAMMA GT: GGT: 28 U/L (ref 7–50)

## 2019-10-14 LAB — BILIRUBIN, DIRECT: Bilirubin, Direct: <0.1 mg/dL (ref 0.0–0.2)

## 2019-10-14 LAB — URIC ACID: Uric Acid, Serum: 3.9 mg/dL (ref 2.5–7.1)

## 2019-10-14 LAB — AMYLASE: Amylase: 74 U/L (ref 28–100)

## 2019-10-14 NOTE — Progress Notes (Signed)
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  Her study nurse was also present during the visit  Mackenzie [name] is on study with [name]--Ribociclb and anti estrogen therapy with Letrozole daily.  She is tolerating these medications without any major side effects: Specifically she is not having any diarrhea or rash nausea or fatigue from the ribociclib and she is only having occasional hot flashes from the letrozole.  She does have some vaginal dryness issues.  She is currently cycle 16 of study  She also continues on goserelin every 4 weeks.  She does fine with this.    Her most recent bone density screening on 03/16/2018, showed a T-score of -0.7, which is considered normal.    Since her last visit, she underwent right screening mammogram on 10/12/2019 showing: breast density category C; no evidence of malignancy.   REVIEW OF SYSTEMS: Mackenzie [name] has had both her vaccine doses.  She is very busy at work.  She had a busy weekend this Mother's Day weekend because her daughter was playing at [employer] and she spent the whole weekend there.  They will seen has entered a weight loss program and has already lost 5 pounds.  She is having some tingling in her  left hand at times.  She was taking gabapentin for some neuropathy in her feet.  She stopped doing that and after a while the feet became a little bit more of an issue so she has gone back on that.  She does have some nail fungus which concerns her in which she successfully conceals with nail decoration aside from these issues a detailed review of systems today was stable   HISTORY OF CURRENT ILLNESS: From the original intake note:  The patient herself noted some changes in her left breast late July 2018, she says, and eventually brought this to medical attention so that on 01/09/2017 she underwent bilateral diagnostic mammography with tomography and left breast ultrasonography at the breast Center. This found the breast density to be category C. In the upper inner quadrant of the left breast there was an area of asymmetry and there were malignant type calcifications involving all 4 quadrants. On exam there is firmness and palpable thickening in the anterior left breast with skin dimpling. Ultrasonography found at the 9:30 o'clock radiant 3 cm from the nipple a 2.7 cm mass and in the left axilla for abnormal lymph nodes largest of which measured 2.7 cm.  Biopsy of the left breast 9:30 o'clock mass 80 01/26/2017 showed ([id number]) invasive ductal carcinoma, with extracellular mucin, grade 1 or 2. In the lower outer left breast is separated biopsy the same day showed ductal carcinoma in situ. One of the 4 lymph nodes involved was positive for metastatic carcinoma. Prognostic panel on the invasive disease showed it to be estrogen receptor 100% positive, progesterone receptor 20% positive, both with  strong staining intensity, with an MIB-1 of 20%, and no HER-2 amplification with a signals ratio 1.38 and the number per cell 2.90.  On 01/22/2017 the patient underwent bilateral breast MRI. This showed no involvement of the right breast, but in the left breast there was a masslike and non-masslike enhancement  involving all quadrants, with skin swelling but no abnormal enhancement of the skin or nipple areolar complex or pectoralis muscle. The mass could not be clearly measured but spanned approximately 12 cm. There was bulky left axillary lymphadenopathy, with the largest lymph node measuring up to 3.4 cm.  The patient's subsequent history is as detailed below.   PAST MEDICAL HISTORY: Past Medical History:  Diagnosis Date  . Abnormal glucose 2018  . 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 Allergic rhinitis 2012  . Anemia 01/27/2017   prior to starting chemotherapy  . Breast cancer ([place]) 01/14/2017   Left breast  . 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 Edema, lower extremity   . Hot flashes 03/2017  . Hyperlipidemia 03/20/2016  . Morbid obesity with body mass index (BMI) of 40.0 to 49.9 ([place]) 09/17/2017  . NCGS (non-celiac gluten sensitivity)   . Pre-diabetes   . Seasonal allergies 2012   seasonal allergies causes allergic rhinitis and itchy, dry eyes per pt  . Vitamin D deficiency 05/2015    PAST SURGICAL HISTORY: Past Surgical History:  Procedure Laterality Date  . BREAST RECONSTRUCTION WITH PLACEMENT OF TISSUE EXPANDER AND FLEX HD (ACELLULAR HYDRATED DERMIS) Left 08/27/2017   Procedure: LEFT BREAST RECONSTRUCTION WITH PLACEMENT OF TISSUE EXPANDER AND FLEX HD;  Surgeon: Wallace Going, DO;  Location: [facility];  Service: Plastics;  Laterality: Left;  . CESAREAN SECTION     x2  . LATISSIMUS FLAP TO BREAST Left 11/03/2018  . LATISSIMUS FLAP TO BREAST Left 11/03/2018   Procedure: LATISSIMUS FLAP TO LEFT BREAST;  Surgeon: Wallace Going, DO;  Location: [facility];  Service: Plastics;  Laterality: Left;  Marland Kitchen MASTECTOMY MODIFIED RADICAL Left 08/27/2017  . MASTECTOMY MODIFIED RADICAL Left 08/27/2017   Procedure: LEFT MODIFIED RADICAL MASTECTOMY;  Surgeon: Erroll Luna, MD;  Location: [facility];  Service: General;  Laterality: Left;  Marland Kitchen MASTOPEXY  Right 03/03/2019   Procedure: RIGHT BREAST MASTOPEXY/REDUCTION;  Surgeon: Wallace Going, DO;  Location: [facility];  Service: Plastics;  Laterality: Right;  3 hours, please  . PORT-A-CATH REMOVAL Right 08/27/2017   Procedure: REMOVAL PORT-A-CATH RIGHT CHEST;  Surgeon: Erroll Luna, MD;  Location: [facility];  Service: General;  Laterality: Right;  . PORTACATH PLACEMENT Right 01/28/2017   Procedure: INSERTION PORT-A-CATH WITH ULTRASOUND;  Surgeon: Erroll Luna, MD;  Location: [facility];  Service: General;  Laterality: Right;  . REDUCTION MAMMAPLASTY Right   . REMOVAL OF TISSUE EXPANDER AND PLACEMENT OF IMPLANT Left 03/03/2019   Procedure: REMOVAL OF TISSUE EXPANDER AND PLACEMENT OF EXPANDER;  Surgeon: Wallace Going, DO;  Location: [facility];  Service: Plastics;  Laterality: Left;  . REMOVAL OF TISSUE EXPANDER AND PLACEMENT OF IMPLANT Left 05/25/2019   Procedure: LEFT BREAST REMOVAL OF TISSUE EXPANDER AND PLACEMENT OF IMPLANT;  Surgeon: Wallace Going, DO;  Location: [facility];  Service: Plastics;  Laterality: Left;  . TISSUE EXPANDER PLACEMENT Left 11/03/2018   Procedure: PLACEMENT OF TISSUE EXPANDER LEFT BREAST;  Surgeon: Wallace Going, DO;  Location: [facility];  Service: Plastics;  Laterality: Left;  Total case time  is 3.5 hours  . TUBAL LIGATION Bilateral 01/21/2011    FAMILY HISTORY Family History  Problem Relation Age of Onset  . Breast cancer Paternal Grandmother 57       d.60s from breast cancer. Did not have treatment.  . Other Mother        H.88 from complications of surgery to remove brain tumor  . Diabetes Mother   . Hypertension Mother   . Stroke Mother   . Kidney disease Father   . Hypertension Other   . Diabetes Other   . Stroke Other    The patient's father still alive at age 4. The patient's mother died with a brain tumor which the patient says was "benign". She was 56. The patient has one  brother, no sisters. The only breast cancer in the family is a paternal grandmother who died from breast cancer at an unknown age. There is no history of ovarian or prostate cancer in the family.   GYNECOLOGIC HISTORY:  Patient's last menstrual period was 02/10/2017. Menarche age 4 and first live birth age 60 she is [place] P4. The patient is still having regular periods as of August 2018   SOCIAL HISTORY: (Updated 05/31/2018) Mackenzie [name] works at [employer] on the fifth floor, [place]. She is separated from her husband (as of 05/2018), [name], who is a Building control surveyor.  1 daughter is married and lives in [place] and works as a Education administrator. Mackenzie [name]'s second daughter, Mackenzie [name], lives in [name] and works in the police department. Daughters, Mackenzie [name] and Kure Beach are 59 and 6, living at home. The patient has a grandson she looks after. She attends a [employer].     ADVANCED DIRECTIVES: Not in place   HEALTH MAINTENANCE: Social History   Tobacco Use  . Smoking status: Never Smoker  . Smokeless tobacco: Never Used  Substance Use Topics  . Alcohol use: Yes    Comment: social  . Drug use: No    Colonoscopy: Never  PAP: December 2017  Bone density: Never   Allergies  Allergen Reactions  . Dilaudid [Hydromorphone] Itching    Current Outpatient Medications  Medication Sig Dispense Refill  . acetaminophen (TYLENOL) 500 MG tablet Take 1,000 mg by mouth every 6 (six) hours as needed (for pain/headaches.).     Marland Kitchen B Complex Vitamins (VITAMIN B COMPLEX PO) Take 1 capsule by mouth 2 (two) times daily.     . budesonide-formoterol (SYMBICORT) 80-4.5 MCG/ACT inhaler Inhale 2 puffs into the lungs every morning.     . Calcium Carb-Cholecalciferol (CALCIUM 600+D3 PO) Take 1 tablet by mouth 2 (two) times daily.     . cetirizine (ZYRTEC) 10 MG tablet Take 10 mg by mouth daily.      . Cholecalciferol (VITAMIN D-3) 5000 units TABS Take 5,000 Units by mouth 2 (two) times daily.     Marland Kitchen gabapentin  (NEURONTIN) 300 MG capsule Take 1 capsule (300 mg total) by mouth at bedtime. (Patient taking differently: Take 300 mg by mouth every other day. ) 90 capsule 4  . hydroxypropyl methylcellulose / hypromellose (ISOPTO TEARS / GONIOVISC) 2.5 % ophthalmic solution Place 1 drop into both eyes daily as needed for dry eyes.     Marland Kitchen ibuprofen (ADVIL,MOTRIN) 200 MG tablet Take 800 mg by mouth every 8 (eight) hours as needed for mild pain (for pain.).     . Investigational ribociclib (KISQALI) 200 MG tablet [name] Take 2 tablets (400 mg total) by mouth daily. Take 2 tablets ('400mg'$  total) by mouth  daily on days 1-21.  Repeat every 28 days. 225 tablet 0  . letrozole (FEMARA) 2.5 MG tablet Take 1 tablet (2.5 mg total) by mouth daily. 90 tablet 4  . loratadine (CLARITIN) 10 MG tablet Take 1 tablet (10 mg total) by mouth daily. (Patient taking differently: Take 10 mg by mouth daily as needed for allergies. ) 90 tablet 2  . metFORMIN (GLUCOPHAGE) 500 MG tablet Take 1 tablet (500 mg total) by mouth every morning. 30 tablet 0  . Multiple Vitamin (MULTIVITAMIN WITH MINERALS) TABS tablet Take 1 tablet by mouth daily.     . Omega-3 Fatty Acids (FISH OIL) 1000 MG CAPS Take 1,000 mg by mouth 2 (two) times daily.     . phenylephrine (SUDAFED PE) 10 MG TABS tablet Take 10 mg by mouth every 4 (four) hours as needed.     . Probiotic Product (PROBIOTIC DAILY PO) Take 1 capsule by mouth 2 (two) times daily.     . Turmeric 450 MG CAPS Take 450 mg by mouth 2 (two) times daily.    Marland Kitchen ZINC OXIDE PO Take 1 tablet by mouth 2 (two) times a day.     No current facility-administered medications for this visit.   Facility-Administered Medications Ordered in Other Visits  Medication Dose Route Frequency Provider Last Rate Last Admin  . sodium chloride flush (NS) 0.9 % injection 10 mL  10 mL Intravenous PRN Magrinat, [name], MD   10 mL at 03/10/17 1239    OBJECTIVE: African-American woman in no acute distress Vitals:   10/17/19  0909  BP: 130/80  Pulse: 79  Resp: 18  Temp: 98.3 F (36.8 C)  SpO2: 100%   Wt Readings from Last 3 Encounters:  10/17/19 265 lb 9.6 oz (120.5 kg)  10/05/19 262 lb (118.8 kg)  09/20/19 266 lb (120.7 kg)   Body mass index is 44.2 kg/m.   ECOG FS: 1  Sclerae unicteric, EOMs intact Wearing a mask No cervical or supraclavicular adenopathy Lungs no rales or rhonchi Heart regular rate and rhythm Abd soft, nontender, positive bowel sounds MSK no focal spinal tenderness, chronic left grade 1 upper extremity lymphedema Neuro: nonfocal, well oriented, appropriate affect Breasts: The right breast is status post reduction mammoplasty.  It is otherwise unremarkable.  The left breast is status post mastectomy with latissimus reconstruction.  There is no evidence of local recurrence.  Both axillae are benign.   LAB RESULTS:  No visits with results within 3 Day(s) from this visit.  Latest known visit with results is:  Appointment on 10/14/2019  Component Date Value Ref Range Status  . Uric Acid, Serum 10/14/2019 3.9  2.5 - 7.1 mg/dL Final   Performed at [facility], [place] [address] [place], [place] [address]  . Phosphorus 10/14/2019 3.7  2.5 - 4.6 mg/dL Final   Performed at [place] [address] [place], [place] [address]  . Magnesium 10/14/2019 1.8  1.7 - 2.4 mg/dL Final   Performed at [facility], [place] [address], [place], [place] [address]  . Lipase 10/14/2019 24  11 - 51 U/L Final   Performed at [facility], [place] [address] [place], [place] [address]  . LDH 10/14/2019 200* 98 - 192 U/L Final   Performed at [facility], [place] [address] [place], [place] [address]  . GGT 10/14/2019 28  7 - 50 U/L Final   Performed at [facility],  [place] [address] [place], [place] [address]  . Sodium 10/14/2019 137  135 - 145 mmol/L Final  . Potassium 10/14/2019  4.0  3.5 - 5.1 mmol/L Final  . Chloride 10/14/2019 103  98 - 111 mmol/L Final  . CO2 10/14/2019 24  22 - 32 mmol/L Final  . Glucose, Bld 10/14/2019 91  70 - 99 mg/dL Final   Glucose reference range applies only to samples taken after fasting for at least 8 hours.  . BUN 10/14/2019 19  6 - 20 mg/dL Final  . Creatinine 10/14/2019 0.99  0.44 - 1.00 mg/dL Final  . Calcium 10/14/2019 9.5  8.9 - 10.3 mg/dL Final  . Total Protein 10/14/2019 8.0  6.5 - 8.1 g/dL Final  . Albumin 10/14/2019 3.9  3.5 - 5.0 g/dL Final  . AST 10/14/2019 28  15 - 41 U/L Final  . ALT 10/14/2019 35  0 - 44 U/L Final  . Alkaline Phosphatase 10/14/2019 131* 38 - 126 U/L Final  . Total Bilirubin 10/14/2019 0.4  0.3 - 1.2 mg/dL Final  . GFR, Est Non Af Am 10/14/2019 >60  >60 mL/min Final  . GFR, Est AFR Am 10/14/2019 >60  >60 mL/min Final  . Anion gap 10/14/2019 10  5 - 15 Final   Performed at [facility], [place] [address] [place], [name] [address]  . WBC Count 10/14/2019 4.5  4.0 - 10.5 K/uL Final  . RBC 10/14/2019 4.25  3.87 - 5.11 MIL/uL Final  . Hemoglobin 10/14/2019 12.5  12.0 - 15.0 g/dL Final  . HCT 10/14/2019 38.1  36.0 - 46.0 % Final  . MCV 10/14/2019 89.6  80.0 - 100.0 fL Final  . MCH 10/14/2019 29.4  26.0 - 34.0 pg Final  . MCHC 10/14/2019 32.8  30.0 - 36.0 g/dL Final  . RDW 10/14/2019 13.8  11.5 - 15.5 % Final  . Platelet Count 10/14/2019 201  150 - 400 K/uL Final  . nRBC 10/14/2019 0.0  0.0 - 0.2 % Final  . Neutrophils Relative % 10/14/2019 58  % Final  . Neutro Abs 10/14/2019 2.6  1.7 - 7.7 K/uL Final  . Lymphocytes Relative 10/14/2019 26  % Final  . Lymphs Abs 10/14/2019 1.2  0.7 - 4.0 K/uL Final  . Monocytes Relative 10/14/2019 14  % Final  . Monocytes Absolute 10/14/2019 0.6  0.1 - 1.0 K/uL Final  . Eosinophils Relative 10/14/2019 1  % Final  . Eosinophils Absolute 10/14/2019 0.0  0.0 - 0.5 K/uL Final  . Basophils Relative 10/14/2019 1  % Final  . Basophils Absolute  10/14/2019 0.0  0.0 - 0.1 K/uL Final  . Immature Granulocytes 10/14/2019 0  % Final  . Abs Immature Granulocytes 10/14/2019 0.01  0.00 - 0.07 K/uL Final   Performed at [facility], [place] [address], [place], [place] [address]  . Bilirubin, Direct 10/14/2019 <0.1  0.0 - 0.2 mg/dL Final   Performed at [place] [address], [place], [place] [address]  . Amylase 10/14/2019 74  28 - 100 U/L Final   Performed at [facility], [place] [address], [place],  [address]     STUDIES: MM 3D SCREEN BREAST UNI RIGHT  Result Date: 10/12/2019 CLINICAL DATA:  Screening. History of treated left breast cancer, status post left mastectomy, and reduction mammoplasty of the right breast. EXAM: DIGITAL SCREENING UNILATERAL RIGHT MAMMOGRAM WITH CAD AND TOMO COMPARISON:  Previous exam(s). ACR Breast Density Category c: The  breast tissue is heterogeneously dense, which may obscure small masses. FINDINGS: The patient has had a left mastectomy. There are no findings suspicious for malignancy. Images were processed with CAD. IMPRESSION: No mammographic evidence of malignancy. A result letter of this screening mammogram will be mailed directly to the patient. RECOMMENDATION: Screening mammogram in one year.  (Code:[id number]-63M) BI-RADS CATEGORY  1: Negative. Electronically Signed   By: Fidela Salisbury M.D.   On: 10/12/2019 09:06     ELIGIBLE FOR AVAILABLE RESEARCH PROTOCOL: [name], ASA study   ASSESSMENT: 49 y.o. [place] woman status post left breast upper inner quadrant biopsy 01/14/2017 for a clinical T3 N2, stage IIA invasive ductal carcinoma, grade 1 or 2, estrogen and progesterone receptor positive, HER-2 nonamplified, with an MIB-1 of 20%.  (1) staging studies: Brain MRI, bone scan, and CT scan of the chest 02/05/2017 showed no brain lesions, no lung or liver lesions, a 4.9 cm mass in the left breast with left axillary and subpectoral  adenopathy, and nonspecific bone scan tracer at L2, left scapula, and anterior ribs, with lumbar spine MRI suggested for further evaluation.  (a) lumbar spine MRI 02/17/2017 showed no normal bone lesions.  There was mild lumbar spondylosis  (2) neoadjuvant chemotherapy consisting of cyclophosphamide and doxorubicin in dose dense fashion 4 started 02/10/2017, completed 03/24/2017, followed by weekly carboplatin and gemcitabine given days 1 and 8 of each 21-day cycle starting 04/14/2017, completing the planned 4 cycles 06/26/2017  (3) is post left modified radical mastectomy on 08/27/2017 showing an mpT3 pN2 residual invasive ductal carcinoma, grade 2, with a residual cancer burden of 3.  Margins were clear  (a) latissimus flap reconstruction with expander 11/03/2018  (4) postmastectomy radiation completed 01/01/2018  (a) capecitabine radiosensitization 11/16/2017-01/01/2018  (5) goserelin started 09/21/2017  (a) letrozole started 01/11/2018  (b) enrolled in [place] clinical trial, randomized to ribociclib on 05/31/2018  (c) Bone density on 03/16/2018 was normal with T score of -0.7 in the L1-L4 spine   (6) genetics testing 03/24/2017 through the Common Hereditary Cancer Panel offered by [facility] found no deleterious mutations in APC, ATM, AXIN2, BARD1, BMPR1A, BRCA1, BRCA2, BRIP1, CDH1, CDKN2A (p14ARF), CDKN2A (p16INK4a), CHEK2, CTNNA1, DICER1, EPCAM (Deletion/duplication testing only), GREM1 (promoter region deletion/duplication testing only), KIT, MEN1, MLH1, MSH2, MSH3, MSH6, MUTYH, NBN, NF1, NHTL1, PALB2, PDGFRA, PMS2, POLD1, POLE, PTEN, RAD50, RAD51C, RAD51D, SDHB, SDHC, SDHD, SMAD4, SMARCA4. STK11, TP53, TSC1, TSC2, and VHL.  The following genes were evaluated for sequence changes only: SDHA and HOXB13 c.251G>A variant only.    (7) Right breast reduction and left breast expander placement on 03/03/2019  (a) Implant exchanged on 05/25/2019 or a [place] Smooth Round Ultra High Profile Gel 750cc.  Ref #[id number].  Serial Number [id number]  PLAN: Lizvette is now a little over 2 years out from definitive surgery for her breast cancer with no evidence of disease recurrence.  This is very favorable.  She is tolerating her goserelin and letrozole quite well, with only some vaginal dryness as the main concern.  She is aware of our pelvic health program and at this point she is not interested in participating.  She is tolerating the ribociclib remarkably well and there are no adverse events to report regarding that medication.  She has a slightly elevated alkaline phosphatase which I think is going to be due to fatty infiltration of the liver and I commended her on the weight loss program she has entered which will be helpful in that regard.  Her minimally elevated LDH is just  something we need to follow.  It is very nonspecific.  Total encounter time 25 minutes.Sarajane Jews C. Magrinat, MD  10/17/19 9:10 AM Medical Oncology and Hematology [facility] [place], [place] [address] Tel. [phone]    Fax. [phone]   I, Wilburn Mylar, am acting as scribe for Dr. [name]. Magrinat.  I, Lurline Del MD, have reviewed the above documentation for accuracy and completeness, and I agree with the above.    *Total Encounter Time as defined by the Centers for Medicare and Medicaid Services includes, in addition to the face-to-face time of a patient visit (documented in the note above) non-face-to-face time: obtaining and reviewing outside history, ordering and reviewing medications, tests or procedures, care coordination (communications with other health care professionals or caregivers) and documentation in the medical record.

## 2019-10-14 NOTE — Research (Signed)
Assessment prior to C19D1 of [name]: Patient into clinic by herself this morning to complete some of the study activities prior to cycle 19.   PROs; Study questionnaires given to patient to complete prior to her lab appointment this morning.  Patient completed by herself while waiting for lab appointment. Research nurse collected and checked for completeness and accuracy.  Labs; Labs drawn per protocol.  Patient confirmed she has been fasting for at least 8 hours prior to this appointment. Informed patient research nurse will call her if any urgent results today. She has not taken Letrozole yet today and instructed patient she may take as soon as her labs are collected.  Plan; Patient confirmed her appointments for Monday 10/17/19 to start cycle 19. Reminded patient to bring in her Ribociclib bottles with leftover pills and her medication diaries on Monday. Remind patient to not take her Ribociclib or Letrozole before her appointment as she will need to take in clinic that day. She is also scheduled for to get Zoladex injection. Thanked patient for her time today and encouraged to call if any questions or concerns prior to Monday. She verbalized understanding. Foye Spurling, BSN, RN Clinical Research Nurse 10/14/2019

## 2019-10-17 ENCOUNTER — Inpatient Hospital Stay: Payer: 59

## 2019-10-17 ENCOUNTER — Other Ambulatory Visit: Payer: 59

## 2019-10-17 ENCOUNTER — Other Ambulatory Visit: Payer: Self-pay

## 2019-10-17 ENCOUNTER — Encounter: Payer: Self-pay | Admitting: *Deleted

## 2019-10-17 ENCOUNTER — Inpatient Hospital Stay (HOSPITAL_BASED_OUTPATIENT_CLINIC_OR_DEPARTMENT_OTHER): Payer: 59 | Admitting: Oncology

## 2019-10-17 VITALS — BP 130/80 | HR 79 | Temp 98.3°F | Resp 18 | Ht 65.0 in | Wt 265.6 lb

## 2019-10-17 DIAGNOSIS — R7402 Elevation of levels of lactic acid dehydrogenase (LDH): Secondary | ICD-10-CM

## 2019-10-17 DIAGNOSIS — C50912 Malignant neoplasm of unspecified site of left female breast: Secondary | ICD-10-CM

## 2019-10-17 DIAGNOSIS — R748 Abnormal levels of other serum enzymes: Secondary | ICD-10-CM

## 2019-10-17 DIAGNOSIS — Z170 Estrogen receptor positive status [ER+]: Secondary | ICD-10-CM | POA: Diagnosis not present

## 2019-10-17 DIAGNOSIS — Z006 Encounter for examination for normal comparison and control in clinical research program: Secondary | ICD-10-CM | POA: Diagnosis not present

## 2019-10-17 DIAGNOSIS — C50812 Malignant neoplasm of overlapping sites of left female breast: Secondary | ICD-10-CM | POA: Diagnosis not present

## 2019-10-17 DIAGNOSIS — Z79811 Long term (current) use of aromatase inhibitors: Secondary | ICD-10-CM | POA: Diagnosis not present

## 2019-10-17 DIAGNOSIS — C50212 Malignant neoplasm of upper-inner quadrant of left female breast: Secondary | ICD-10-CM

## 2019-10-17 DIAGNOSIS — Z5111 Encounter for antineoplastic chemotherapy: Secondary | ICD-10-CM | POA: Diagnosis not present

## 2019-10-17 DIAGNOSIS — Z9012 Acquired absence of left breast and nipple: Secondary | ICD-10-CM

## 2019-10-17 MED ORDER — GOSERELIN ACETATE 3.6 MG ~~LOC~~ IMPL
3.6000 mg | DRUG_IMPLANT | Freq: Once | SUBCUTANEOUS | Status: AC
  Administered 2019-10-17: 3.6 mg via SUBCUTANEOUS

## 2019-10-17 MED ORDER — GOSERELIN ACETATE 3.6 MG ~~LOC~~ IMPL
DRUG_IMPLANT | SUBCUTANEOUS | Status: AC
  Filled 2019-10-17: qty 3.6

## 2019-10-17 NOTE — Patient Instructions (Signed)

## 2019-10-17 NOTE — Research (Signed)
[facility] study: Patient into clinic by herself this morning to complete the study activities prior to starting cycles 19, 20 and 21 on study.   PROs; Completed prior to lab on 10/14/19.  Labs; Completed on 10/14/19.  Results reviewed by Dr. Jana Hakim. Results are all within study parameters to continue Ribociclib. Dr. Jana Hakim considers the slightly elevated LDH as clinically insignificant.  Corrected Calcium Formula:  4 g/dL utilized for "normal albumin;" local normal range is 3.5-5 g/dL. Corrected Calcium = (0.8 x [normal albumin - patient's albumin]) + serum calcium Correct Calcium = (0.8 x [4-3.9]) + 9.5  Corrected Calcium =  9.58 mg/dl Vitals; Vital signs completed after patient rested more than five minutes. Weight without shoes. See VS flowsheet.  Concomitant Medications; Reviewed medication list with patient. She reports she started taking Metformin on 09/16/19.    Physical Exam/PS - Please see documentation in today's office visit encounter with Dr. Jana Hakim. Adverse Events;  Patient reports mild diarrhea when she started taking Metformin for 3 days. Diarrhea improved with taking Metformin with food instead of on a empty stomach.  Patient reports no further pain in left chest wall but left shoulder pain has been worse in the past month and now radiating to left neck. Patient taking ibuprofen prn for pain. Patient reported bilateral knee pain getting progressively worse since the beginning of February. She received steroid injections on 08/03/19 and pain improved slowly improving but still present.  See AE table below for full review of AEs and attributions.  Investigational Medication - Cycles 16, 17 and 18 study medication bottles and completed diaries were returned by patient.  Study medication bottles were returned to pharmacy today for accounting by pharmacist Romualdo Bolk.  Cycle 16 bottle has 37 pills left out of 75 dispensed which equates to 2 missed days this cycle. Patient's diary  reflects 2 missed days of ribociclib and 1 missed day of letrozole.  Cycle 17 bottle has 41 pills left out of 75 dispensed which equates to 4 missed days this cycle. Patient's diary shows only 3 days missed of ribociclib and 1 day missed of letrozole. Patient states she recorded in her diary correctly to the best of her memory.  Cycle 18 bottle has 15 pills left out of 45 dispensed which equates to 6 missed days this cycle. Patient's diary shows only 4 days missed of ribociclib along with 5 days missed of letrozole this cycle. Patient states her diary was recorded correctly to the best of her recollection and cannot account for the discrepency in pill counts vs diaries.  Patient states all missed doses of ribociclib and letrozole were due to forgetting. She did not miss any doses on purpose or for any AEs.  We discussed strategies to improve compliance with daily ribociclib and letrozole. Some suggestions included setting a daily timer on patient's watch, switching to a different time of day that might be easier to remember and less hectic such as at bedtime, taking the same time of day as other daily medication and keeping diaries in visible location she will see daily.  Patient verbalized understanding of importance of compliance with study drug.  3 new bottles of investigational drug (to cover cycles 19 through 21) were assigned by IRT as kit #[id number], #[id number] and [phone]. These bottles were was dispensed by pharmacist Romualdo Bolk today continuing at the full dose of 400 mg daily. Patient was provided with the bottles and 3 copies of the patient diary for recording medication doses during cycles 19  through 21.  Patient self-administered ribociclib and letrozole in clinic under the supervision of the research nurse at 10:12 am. Patient filled in diary dosing for today with time of 10:12 am. Patient understands that once a bottle of ribociclib is opened it should not be used after 30 days. Patient was  instructed to open a new bottle with each cycle as marked, filling in the date opened, and to retain the unopened and partial bottles for return at her next clinic visit. She verbalized understanding. Plan/Scheduling - Patient is scheduled for goserelin injections every 4 weeks. Next study visit is due prior to cycle 22 on 01/09/20. Patient is planning on going out of town the Friday before this next study visit so she will come in for labs in the morning of 01/09/20 and return for MD appointment later in the same day instead of having labs collected the Friday before.  Patient is aware that she should contact the clinic at any time if she has any questions or concerns prior to next visit.  Thanked patient for her ongoing participation in this clnical trial.    AE Table: Event Grade Onset Date End Date Status Comments Attribution to Ribociclib Attribution to Letrozole Attribution to Goserelin  Allergic Rhinitis 2 2012  ongoing At Baseline n/a n/a n/a  Constipation 1 08/27/17  ongoing At Baseline n/a n/a n/a  Cough, intermittent 2 05/09/18  ongoing At Baseline n/a n/a n/a  GERD, intermittent 1 2018  ongoing At Baseline n/a n/a n/a  Gluten Sensitivity 1 12/2017  ongoing At Baseline n/a n/a n/a  Headaches 1 02/2017  ongoing At Baseline n/a n/a n/a  Hot Flashes 2 03/2017  ongoing At Baseline n/a n/a n/a  Hyperglycemia 1 2018  ongoing At Baseline n/a n/a n/a  Hyperlipidemia 1 03/2016  ongoing At Baseline n/a n/a n/a  Vitamin D Deficiency 2 05/2015  ongoing At Baseline n/a n/a n/a  Arthralgias bilat legs 1 10/01/18  ongoing At Baseline     Bilateral foot pain/ Pes Planus 2   ongoing At Baseline, recently needed new orthotics     Fatigue 1 06/28/18  ongoing   Yes No No  Blurred Vision 1 07/14/18  ongoing  No No No  Hypertension  1-3 2012  ongoing At Baseline. Intermittent grade 1 - 3 recorded since 2012. Changed to History at Baseline.      Left Shoulder stiffness, pain 1 07/12/19 09/24/19 Increased  grade  No No No  Left Shoulder pain 2 09/24/19  ongoing Ibuprofen PRN No No No  Left Neck pain 2 09/24/19  ongoing Ibuprofen PRN No No No  Numbness/tingling (Paresthesia) Left arm 1 05/24/19 09/26/19 resolved  No No No  Numbness/tingling (Paresthesia) Left arm 1 10/10/19  recurred  No No No  Hepatic Cysts 1 04/04/19  ongoing [name] 04/04/19 & MRI 07/06/19   No No No  Surgical pain, left breast/chest wall 1 05/29/19 10/17/19 resolved  No No No  Cholelithiasis 1 04/04/19  ongoing [name] 04/04/19 & MRI 07/06/19 No No No  Anemia 1 07/22/19 10/14/19 resolved  Yes No No  Decreased WBC 1 07/22/19 10/14/19 resolved  Yes No No  Neutrophil Count decreased 1 07/22/19 10/719 resolved  Yes No No  Neuropathy bilateral feet 1 10/08/19  recurred Resolved and then recurred. Gabapentin Yes No No  Dyspnea on exertion 1 08/09/19  ongoing  No No No  Increased Alkaline Phosphatase 1 08/09/19  ongoing  Yes No No  Lymphedema bilateral arms 1 07/25/19  ongoing PHT and Sleeves No No No  Bilateral Knee Pain 2 07/11/19  ongoing Intermittent. PHT and steroid injections No No No  Diarrhea 1 09/17/19 09/20/19 resolved Started with taking Metformin and improved with taking Metformin with food. Yes No No  Nail Infection (fungus on toenails) 1 10/08/19  ongoing  No No No  Vaginal Dryness 1 10/17/19  ongoing  No Yes Yes    Foye Spurling, BSN, RN Clinical Research Nurse 10/17/2019 11:14 AM

## 2019-10-18 ENCOUNTER — Encounter: Payer: Self-pay | Admitting: Oncology

## 2019-10-18 ENCOUNTER — Telehealth: Payer: Self-pay

## 2019-10-18 ENCOUNTER — Telehealth: Payer: Self-pay | Admitting: Oncology

## 2019-10-18 MED ORDER — INV-RIBOCICLIB 200 MG TAB NATALEE TRIO003 STUDY: 400.0000 mg | ORAL_TABLET | Freq: Every day | ORAL | 0 refills | Status: DC

## 2019-10-18 NOTE — Telephone Encounter (Signed)
Faxed signed physician prescription for compression time to [facility]

## 2019-10-18 NOTE — Telephone Encounter (Signed)
Made no changes to pt's schedule. Appts for 5/10 los have already been made because of a sch msg that was sent.

## 2019-10-19 ENCOUNTER — Telehealth: Payer: Self-pay | Admitting: *Deleted

## 2019-10-19 NOTE — Telephone Encounter (Signed)
Received on (10/11/19) via of fax Physician Prescription for Compression Items from [facility].  Requesting to be completed, signed and faxed back.    Called and spoke with the patient to verify the prescription for the compression items.  Patient agreed to the prescription.  Prescription faxed back on (10/14/19).  Confirmation received.  Copy scanned into the chart.//AB/CMA

## 2019-10-26 ENCOUNTER — Other Ambulatory Visit: Payer: Self-pay

## 2019-10-26 ENCOUNTER — Encounter (INDEPENDENT_AMBULATORY_CARE_PROVIDER_SITE_OTHER): Payer: Self-pay | Admitting: Physician Assistant

## 2019-10-26 ENCOUNTER — Ambulatory Visit (INDEPENDENT_AMBULATORY_CARE_PROVIDER_SITE_OTHER): Payer: 59 | Admitting: Physician Assistant

## 2019-10-26 VITALS — BP 131/78 | HR 88 | Temp 98.0°F | Ht 65.0 in | Wt 258.0 lb

## 2019-10-26 DIAGNOSIS — Z6841 Body Mass Index (BMI) 40.0 and over, adult: Secondary | ICD-10-CM

## 2019-10-26 DIAGNOSIS — R7303 Prediabetes: Secondary | ICD-10-CM

## 2019-10-26 DIAGNOSIS — E7849 Other hyperlipidemia: Secondary | ICD-10-CM

## 2019-10-26 DIAGNOSIS — Z9189 Other specified personal risk factors, not elsewhere classified: Secondary | ICD-10-CM

## 2019-10-26 MED ORDER — METFORMIN HCL 500 MG PO TABS: 500.0000 mg | ORAL_TABLET | Freq: Every morning | ORAL | 0 refills | Status: DC

## 2019-10-26 MED FILL — metFORMIN HCL 500 MG TABS: 500 | 30 days supply | Qty: 30 | Fill #0

## 2019-10-26 NOTE — Progress Notes (Signed)
Chief Complaint:   OBESITY Mackenzie [name] is here to discuss her progress with her obesity treatment plan along with follow-up of her obesity related diagnoses. Mackenzie [name] is on the Category 2 Plan and states she is following her eating plan approximately 80% of the time. Mackenzie [name] states she is doing 0 minutes 0 times per week.  Today's visit was #: 5 Starting weight: 267 lbs Starting date: 08/09/2019 Today's weight: 258 lbs Today's date: 10/26/2019 Total lbs lost to date: 9 Total lbs lost since last in-office visit: 6  Interim History: Mackenzie [name] states that she has been snacking less at work. She has been breaking up her meals and eating every 2 hours. She is eating her dinner portion for lunch.  Subjective:   1. Pre-diabetes Mackenzie [name] is on metformin, and she denies nausea, vomiting, diarrhea, or polyphagia.  2. Other hyperlipidemia Mackenzie [name] is not currently on any medications, and she denies chest pain.  3. At risk for diabetes mellitus Mackenzie [name] is at higher than average risk for developing diabetes due to her obesity.   Assessment/Plan:   1. Pre-diabetes Mackenzie [name] will continue to work on weight loss, exercise, and decreasing simple carbohydrates to help decrease the risk of diabetes. We will refill metformin for 1 month.  - metFORMIN (GLUCOPHAGE) 500 MG tablet; Take 1 tablet (500 mg total) by mouth every morning.  Dispense: 30 tablet; Refill: 0  2. Other hyperlipidemia Cardiovascular risk and specific lipid/LDL goals reviewed. We discussed several lifestyle modifications today and Mackenzie [name] will continue to work on diet, exercise and weight loss efforts. Orders and follow up as documented in patient record.   Counseling Intensive lifestyle modifications are the first line treatment for this issue. . Dietary changes: Increase soluble fiber. Decrease simple carbohydrates. . Exercise changes: Moderate to vigorous-intensity aerobic activity 150 minutes per week if tolerated. . Lipid-lowering  medications: see documented in medical record.  3. At risk for diabetes mellitus Mackenzie [name] was given approximately 15 minutes of diabetes education and counseling today. We discussed intensive lifestyle modifications today with an emphasis on weight loss as well as increasing exercise and decreasing simple carbohydrates in her diet. We also reviewed medication options with an emphasis on risk versus benefit of those discussed.   Repetitive spaced learning was employed today to elicit superior memory formation and behavioral change.  4. Class 3 severe obesity with serious comorbidity and body mass index (BMI) of 40.0 to 44.9 in adult, unspecified obesity type (HCC) Mackenzie [name] is currently in the action stage of change. As such, her goal is to continue with weight loss efforts. She has agreed to the Category 2 Plan and keeping a food journal and adhering to recommended goals of 300-400 calories and 30 grams of protein at lunch daily.   Exercise goals: No exercise has been prescribed at this time.  Behavioral modification strategies: planning for success.  Mackenzie [name] has agreed to follow-up with our clinic in 2 weeks. She was informed of the importance of frequent follow-up visits to maximize her success with intensive lifestyle modifications for her multiple health conditions.   Objective:   Blood pressure 131/78, pulse 88, temperature 98 F (36.7 C), temperature source Oral, height 5\' 5"  (1.651 m), weight 258 lb (117 kg), last menstrual period 02/10/2017, SpO2 99 %, unknown if currently breastfeeding. Body mass index is 42.93 kg/m.  General: Cooperative, alert, well developed, in no acute distress. HEENT: Conjunctivae and lids unremarkable. Cardiovascular: Regular rhythm.  Lungs: Normal work of breathing. Neurologic: No focal deficits.   Lab  Results  Component Value Date   CREATININE 0.99 10/14/2019   BUN 19 10/14/2019   NA 137 10/14/2019   K 4.0 10/14/2019   CL 103 10/14/2019   CO2 24  10/14/2019   Lab Results  Component Value Date   ALT 35 10/14/2019   AST 28 10/14/2019   ALKPHOS 131 (H) 10/14/2019   BILITOT 0.4 10/14/2019   Lab Results  Component Value Date   HGBA1C 5.9 (H) 08/09/2019   HGBA1C 5.9 (H) 07/26/2018   Lab Results  Component Value Date   INSULIN 12.6 08/09/2019   Lab Results  Component Value Date   TSH 0.657 08/09/2019   Lab Results  Component Value Date   CHOL 191 08/09/2019   HDL 68 08/09/2019   LDLCALC 115 (H) 08/09/2019   TRIG 42 08/09/2019   Lab Results  Component Value Date   WBC 4.5 10/14/2019   HGB 12.5 10/14/2019   HCT 38.1 10/14/2019   MCV 89.6 10/14/2019   PLT 201 10/14/2019   No results found for: IRON, TIBC, FERRITIN  Attestation Statements:   Reviewed by clinician on day of visit: allergies, medications, problem list, medical history, surgical history, family history, social history, and previous encounter notes.   Wilhemena Durie, am acting as transcriptionist for [employer], PA-C.  I have reviewed the above documentation for accuracy and completeness, and I agree with the above. Abby Potash, PA-C

## 2019-10-27 ENCOUNTER — Ambulatory Visit: Payer: 59 | Admitting: Orthotics

## 2019-10-27 DIAGNOSIS — M2141 Flat foot [pes planus] (acquired), right foot: Secondary | ICD-10-CM

## 2019-10-27 DIAGNOSIS — M19079 Primary osteoarthritis, unspecified ankle and foot: Secondary | ICD-10-CM

## 2019-10-27 DIAGNOSIS — E119 Type 2 diabetes mellitus without complications: Secondary | ICD-10-CM

## 2019-10-27 NOTE — Progress Notes (Signed)
Patient came in today to pick up custom made foot orthotics.  The goals were accomplished and the patient reported no dissatisfaction with said orthotics.  Patient was advised of breakin period and how to report any issues. 

## 2019-11-08 ENCOUNTER — Other Ambulatory Visit (INDEPENDENT_AMBULATORY_CARE_PROVIDER_SITE_OTHER): Payer: Self-pay | Admitting: Physician Assistant

## 2019-11-08 DIAGNOSIS — R7303 Prediabetes: Secondary | ICD-10-CM

## 2019-11-08 MED FILL — LETROZOLE 2.5 MG TABS: 2.5 | 90 days supply | Qty: 90 | Fill #3

## 2019-11-14 ENCOUNTER — Other Ambulatory Visit: Payer: Self-pay

## 2019-11-14 ENCOUNTER — Inpatient Hospital Stay: Payer: 59 | Attending: Adult Health

## 2019-11-14 VITALS — BP 139/80 | HR 76 | Resp 18

## 2019-11-14 DIAGNOSIS — C773 Secondary and unspecified malignant neoplasm of axilla and upper limb lymph nodes: Secondary | ICD-10-CM | POA: Diagnosis not present

## 2019-11-14 DIAGNOSIS — Z5111 Encounter for antineoplastic chemotherapy: Secondary | ICD-10-CM | POA: Diagnosis not present

## 2019-11-14 DIAGNOSIS — Z170 Estrogen receptor positive status [ER+]: Secondary | ICD-10-CM | POA: Insufficient documentation

## 2019-11-14 DIAGNOSIS — C50812 Malignant neoplasm of overlapping sites of left female breast: Secondary | ICD-10-CM | POA: Insufficient documentation

## 2019-11-14 DIAGNOSIS — Z006 Encounter for examination for normal comparison and control in clinical research program: Secondary | ICD-10-CM | POA: Insufficient documentation

## 2019-11-14 MED ORDER — GOSERELIN ACETATE 3.6 MG ~~LOC~~ IMPL
DRUG_IMPLANT | SUBCUTANEOUS | Status: AC
  Filled 2019-11-14: qty 3.6

## 2019-11-14 MED ORDER — GOSERELIN ACETATE 3.6 MG ~~LOC~~ IMPL
3.6000 mg | DRUG_IMPLANT | Freq: Once | SUBCUTANEOUS | Status: AC
  Administered 2019-11-14: 3.6 mg via SUBCUTANEOUS

## 2019-11-14 NOTE — Patient Instructions (Signed)

## 2019-11-16 ENCOUNTER — Ambulatory Visit (INDEPENDENT_AMBULATORY_CARE_PROVIDER_SITE_OTHER): Payer: 59 | Admitting: Physician Assistant

## 2019-11-24 DIAGNOSIS — Z6841 Body Mass Index (BMI) 40.0 and over, adult: Secondary | ICD-10-CM | POA: Diagnosis not present

## 2019-11-24 DIAGNOSIS — D649 Anemia, unspecified: Secondary | ICD-10-CM | POA: Diagnosis not present

## 2019-11-24 DIAGNOSIS — R7301 Impaired fasting glucose: Secondary | ICD-10-CM | POA: Diagnosis not present

## 2019-11-24 DIAGNOSIS — E559 Vitamin D deficiency, unspecified: Secondary | ICD-10-CM | POA: Diagnosis not present

## 2019-11-24 DIAGNOSIS — C50912 Malignant neoplasm of unspecified site of left female breast: Secondary | ICD-10-CM | POA: Diagnosis not present

## 2019-11-24 DIAGNOSIS — Z0001 Encounter for general adult medical examination with abnormal findings: Secondary | ICD-10-CM | POA: Diagnosis not present

## 2019-11-28 DIAGNOSIS — Z0001 Encounter for general adult medical examination with abnormal findings: Secondary | ICD-10-CM | POA: Diagnosis not present

## 2019-11-28 DIAGNOSIS — D649 Anemia, unspecified: Secondary | ICD-10-CM | POA: Diagnosis not present

## 2019-11-28 DIAGNOSIS — C50912 Malignant neoplasm of unspecified site of left female breast: Secondary | ICD-10-CM | POA: Diagnosis not present

## 2019-11-28 MED FILL — METFORMIN HCL 500 MG TABS: 500 | 90 days supply | Qty: 180 | Fill #0

## 2019-11-30 ENCOUNTER — Ambulatory Visit (INDEPENDENT_AMBULATORY_CARE_PROVIDER_SITE_OTHER): Payer: 59 | Admitting: Physician Assistant

## 2019-11-30 ENCOUNTER — Encounter (INDEPENDENT_AMBULATORY_CARE_PROVIDER_SITE_OTHER): Payer: Self-pay | Admitting: Physician Assistant

## 2019-11-30 ENCOUNTER — Other Ambulatory Visit: Payer: Self-pay

## 2019-11-30 VITALS — BP 137/86 | HR 85 | Temp 97.8°F | Ht 65.0 in | Wt 265.0 lb

## 2019-11-30 DIAGNOSIS — R7303 Prediabetes: Secondary | ICD-10-CM

## 2019-11-30 DIAGNOSIS — Z6841 Body Mass Index (BMI) 40.0 and over, adult: Secondary | ICD-10-CM | POA: Diagnosis not present

## 2019-11-30 DIAGNOSIS — E66813 Obesity, class 3: Secondary | ICD-10-CM

## 2019-11-30 NOTE — Progress Notes (Signed)
Chief Complaint:   OBESITY Mackenzie [name] is here to discuss her progress with her obesity treatment plan along with follow-up of her obesity related diagnoses. Mackenzie [name] is on the Category 3 Plan and states she is following her eating plan approximately 75-80% of the time. Mackenzie [name] states she is walking 30 minutes 2-3 times per week.  Today's visit was #: 6 Starting weight: 267 lbs Starting date: 08/09/2019 Today's weight: 265 lbs Today's date: 11/30/2019 Total lbs lost to date: 2 Total lbs lost since last in-office visit: 0  Interim History: Mackenzie [name] is unsure why she gained weight over the last few weeks. She has been following the Category 3 meal plan instead of Category 2. Her PCP increased her metformin to twice daily. She is asking about a GLP-1.  Subjective:   Prediabetes. Mackenzie [name] has a diagnosis of prediabetes based on her elevated HgA1c and was informed this puts her at greater risk of developing diabetes. She continues to work on diet and exercise to decrease her risk of diabetes. She denies nausea or hypoglycemia. Mackenzie [name] is on metformin. No nausea, vomiting, diarrhea, or polyphagia.   Lab Results  Component Value Date   HGBA1C 5.9 (H) 08/09/2019   Lab Results  Component Value Date   INSULIN 12.6 08/09/2019   Assessment/Plan:   Prediabetes. Mackenzie [name] will continue to work on weight loss, exercise, and decreasing simple carbohydrates to help decrease the risk of diabetes. She will continue her medication as directed.  Class 3 severe obesity with serious comorbidity and body mass index (BMI) of 40.0 to 44.9 in adult, unspecified obesity type (Mackenzie [name]).  Mackenzie [name] is currently in the action stage of change. As such, her goal is to continue with weight loss efforts. She has agreed to the Category 2 Plan.   We discussed GLP-1, but the patient is to speak with her oncologist to get approval.  Exercise goals: For substantial health benefits, adults should do at least 150  minutes (2 hours and 30 minutes) a week of moderate-intensity, or 75 minutes (1 hour and 15 minutes) a week of vigorous-intensity aerobic physical activity, or an equivalent combination of moderate- and vigorous-intensity aerobic activity. Aerobic activity should be performed in episodes of at least 10 minutes, and preferably, it should be spread throughout the week.  Behavioral modification strategies: meal planning and cooking strategies and planning for success.  Mackenzie [name] has agreed to follow-up with our clinic in 2 weeks. She was informed of the importance of frequent follow-up visits to maximize her success with intensive lifestyle modifications for her multiple health conditions.   Objective:   Blood pressure 137/86, pulse 85, temperature 97.8 F (36.6 C), temperature source Oral, height 5\' 5"  (1.651 m), weight 265 lb (120.2 kg), last menstrual period 02/10/2017, SpO2 97 %, unknown if currently breastfeeding. Body mass index is 44.1 kg/m.  General: Cooperative, alert, well developed, in no acute distress. HEENT: Conjunctivae and lids unremarkable. Cardiovascular: Regular rhythm.  Lungs: Normal work of breathing. Neurologic: No focal deficits.   Lab Results  Component Value Date   CREATININE 0.99 10/14/2019   BUN 19 10/14/2019   NA 137 10/14/2019   K 4.0 10/14/2019   CL 103 10/14/2019   CO2 24 10/14/2019   Lab Results  Component Value Date   ALT 35 10/14/2019   AST 28 10/14/2019   ALKPHOS 131 (H) 10/14/2019   BILITOT 0.4 10/14/2019   Lab Results  Component Value Date   HGBA1C 5.9 (H) 08/09/2019   HGBA1C 5.9 (H)  07/26/2018   Lab Results  Component Value Date   INSULIN 12.6 08/09/2019   Lab Results  Component Value Date   TSH 0.657 08/09/2019   Lab Results  Component Value Date   CHOL 191 08/09/2019   HDL 68 08/09/2019   LDLCALC 115 (H) 08/09/2019   TRIG 42 08/09/2019   Lab Results  Component Value Date   WBC 4.5 10/14/2019   HGB 12.5 10/14/2019   HCT 38.1  10/14/2019   MCV 89.6 10/14/2019   PLT 201 10/14/2019   No results found for: IRON, TIBC, FERRITIN  Attestation Statements:   Reviewed by clinician on day of visit: allergies, medications, problem list, medical history, surgical history, family history, social history, and previous encounter notes.  Time spent on visit including pre-visit chart review and post-visit charting and care was 30 minutes.   IMichaelene Song, am acting as transcriptionist for Abby Potash, PA-C   I have reviewed the above documentation for accuracy and completeness, and I agree with the above. Abby Potash, PA-C

## 2019-12-13 ENCOUNTER — Inpatient Hospital Stay: Payer: 59 | Attending: Adult Health

## 2019-12-13 ENCOUNTER — Other Ambulatory Visit: Payer: Self-pay

## 2019-12-13 VITALS — BP 143/87 | HR 73 | Temp 98.9°F | Resp 18

## 2019-12-13 DIAGNOSIS — Z170 Estrogen receptor positive status [ER+]: Secondary | ICD-10-CM

## 2019-12-13 DIAGNOSIS — Z5111 Encounter for antineoplastic chemotherapy: Secondary | ICD-10-CM | POA: Insufficient documentation

## 2019-12-13 DIAGNOSIS — C50812 Malignant neoplasm of overlapping sites of left female breast: Secondary | ICD-10-CM | POA: Diagnosis not present

## 2019-12-13 DIAGNOSIS — Z006 Encounter for examination for normal comparison and control in clinical research program: Secondary | ICD-10-CM | POA: Insufficient documentation

## 2019-12-13 MED ORDER — GOSERELIN ACETATE 3.6 MG ~~LOC~~ IMPL
3.6000 mg | DRUG_IMPLANT | Freq: Once | SUBCUTANEOUS | Status: AC
  Administered 2019-12-13: 3.6 mg via SUBCUTANEOUS

## 2019-12-13 MED ORDER — GOSERELIN ACETATE 3.6 MG ~~LOC~~ IMPL
DRUG_IMPLANT | SUBCUTANEOUS | Status: AC
  Filled 2019-12-13: qty 3.6

## 2019-12-13 NOTE — Patient Instructions (Signed)

## 2019-12-14 ENCOUNTER — Ambulatory Visit (INDEPENDENT_AMBULATORY_CARE_PROVIDER_SITE_OTHER): Payer: 59 | Admitting: Family Medicine

## 2019-12-14 ENCOUNTER — Encounter (INDEPENDENT_AMBULATORY_CARE_PROVIDER_SITE_OTHER): Payer: Self-pay | Admitting: Family Medicine

## 2019-12-14 VITALS — BP 134/85 | HR 76 | Temp 98.5°F | Ht 65.0 in | Wt 261.0 lb

## 2019-12-14 DIAGNOSIS — Z6841 Body Mass Index (BMI) 40.0 and over, adult: Secondary | ICD-10-CM | POA: Diagnosis not present

## 2019-12-14 DIAGNOSIS — R7303 Prediabetes: Secondary | ICD-10-CM | POA: Diagnosis not present

## 2019-12-14 DIAGNOSIS — E7849 Other hyperlipidemia: Secondary | ICD-10-CM | POA: Diagnosis not present

## 2019-12-14 NOTE — Patient Instructions (Signed)
The 10-year ASCVD risk score Mikey Bussing DC Brooke Bonito., et al., 2013) is: 3.9%   Values used to calculate the score:     Age: 49 years     Sex: Female     Is Non-Hispanic African American: Yes     Diabetic: Yes     Tobacco smoker: No     Systolic Blood Pressure: 360 mmHg     Is BP treated: No     HDL Cholesterol: 68 mg/dL     Total Cholesterol: 191 mg/dL

## 2019-12-15 NOTE — Progress Notes (Signed)
Chief Complaint:   OBESITY Mackenzie [name] is here to discuss her progress with her obesity treatment plan along with follow-up of her obesity related diagnoses. Mackenzie [name] is on the Category 2 Plan and states she is following her eating plan approximately 50-60% of the time. Mackenzie [name] states she is doing Zumba for 45 minutes 1 time per week and walking 1.5 miles 2-3 times per week.  Today's visit was #: 7 Starting weight: 267 lbs Starting date: 08/09/2019 Today's weight: 261 lbs Today's date: 12/14/2019 Total lbs lost to date: 6 lbs Total lbs lost since last in-office visit: 4 lbs  Interim History: Mackenzie [name] previously was on Category 3, and recently changed to Category 2.  This last week she followed Category 2 well.  She did not like that she could not have [employer] milk.  She has struggled with feeling hunger after work when she gets home - she eats "unhealthier" items.  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  Subjective:   1. Prediabetes Mackenzie [name] has a diagnosis of prediabetes based on her elevated HgA1c and was informed this puts her at greater risk of developing diabetes. She continues to work on diet and exercise to decrease her risk of diabetes. She denies nausea or hypoglycemia.  Mackenzie [name]'s PCP increased her metformin the week around 11/30/2019.  She is tolerating it well.  It is little help to cravings and hunger.  Lab Results  Component Value Date   HGBA1C 5.9 (H) 08/09/2019   Lab Results  Component Value Date   INSULIN 12.6 08/09/2019   2. Other hyperlipidemia Mackenzie [name] has hyperlipidemia and has been trying to improve her cholesterol levels with intensive lifestyle modification including a low saturated fat diet, exercise and weight loss. She denies any chest pain, claudication or myalgias.    Lab Results  Component Value Date   ALT 35 10/14/2019   AST 28 10/14/2019   ALKPHOS 131 (H) 10/14/2019   BILITOT 0.4  10/14/2019   Lab Results  Component Value Date   CHOL 191 08/09/2019   HDL 68 08/09/2019   LDLCALC 115 (H) 08/09/2019   TRIG 42 08/09/2019   Assessment/Plan:   1. Prediabetes Mackenzie [name] will continue to work on weight loss, exercise, and decreasing simple carbohydrates to help decrease the risk of diabetes.  Recheck labs every 4-6 months or sooner if health changes are made.  2. Other hyperlipidemia Cardiovascular risk and specific lipid/LDL goals reviewed.  We discussed several lifestyle modifications today and Annikah will continue to work on diet, exercise and weight loss efforts. Orders and follow up as documented in patient record.  Continue prudent nutritional plan, weight loss.  Decrease saturated and trans fats in diet.  No need for medications at this time.  Continue to follow along with PCP.  Counseling Intensive lifestyle modifications are the first line treatment for this issue. . Dietary changes: Increase soluble fiber. Decrease simple carbohydrates. . Exercise changes: Moderate to vigorous-intensity aerobic activity 150 minutes per week if tolerated. . Lipid-lowering medications: see documented in medical record.  3. Class 3 severe obesity with serious comorbidity and body mass index (BMI) of 40.0 to 44.9 in adult, unspecified obesity type (HCC) Mackenzie [name] is currently in the action stage of change. As such, her goal is to continue with weight loss efforts. She has agreed to the Category 2 Plan.   Exercise goals: As is.  She will be starting water aerobics soon.  Behavioral  modification strategies: increasing lean protein intake, increasing water intake and no skipping meals.  Mackenzie [name] has agreed to follow-up with our clinic in 2-3 weeks. She was informed of the importance of frequent follow-up visits to maximize her success with intensive lifestyle modifications for her multiple health conditions.   Objective:   Blood pressure 134/85, pulse 76, temperature 98.5 F (36.9 C),  temperature source Oral, height 5\' 5"  (1.651 m), weight 261 lb (118.4 kg), last menstrual period 02/10/2017, SpO2 99 %, unknown if currently breastfeeding. Body mass index is 43.43 kg/m.  General: Cooperative, alert, well developed, in no acute distress. HEENT: Conjunctivae and lids unremarkable. Cardiovascular: Regular rhythm.  Lungs: Normal work of breathing. Neurologic: No focal deficits.   Lab Results  Component Value Date   CREATININE 0.99 10/14/2019   BUN 19 10/14/2019   NA 137 10/14/2019   K 4.0 10/14/2019   CL 103 10/14/2019   CO2 24 10/14/2019   Lab Results  Component Value Date   ALT 35 10/14/2019   AST 28 10/14/2019   ALKPHOS 131 (H) 10/14/2019   BILITOT 0.4 10/14/2019   Lab Results  Component Value Date   HGBA1C 5.9 (H) 08/09/2019   HGBA1C 5.9 (H) 07/26/2018   Lab Results  Component Value Date   INSULIN 12.6 08/09/2019   Lab Results  Component Value Date   TSH 0.657 08/09/2019   Lab Results  Component Value Date   CHOL 191 08/09/2019   HDL 68 08/09/2019   LDLCALC 115 (H) 08/09/2019   TRIG 42 08/09/2019   Lab Results  Component Value Date   WBC 4.5 10/14/2019   HGB 12.5 10/14/2019   HCT 38.1 10/14/2019   MCV 89.6 10/14/2019   PLT 201 10/14/2019   Attestation Statements:   Reviewed by clinician on day of visit: allergies, medications, problem list, medical history, surgical history, family history, social history, and previous encounter notes.  Time spent on visit including pre-visit chart review and post-visit care and charting was 26 minutes.   I, Water quality scientist, CMA, am acting as Location manager for [employer], DO.  I have reviewed the above documentation for accuracy and completeness, and I agree with the above. Mellody Dance, DO

## 2019-12-23 ENCOUNTER — Telehealth: Payer: Self-pay | Admitting: *Deleted

## 2019-12-23 DIAGNOSIS — C50212 Malignant neoplasm of upper-inner quadrant of left female breast: Secondary | ICD-10-CM

## 2019-12-23 DIAGNOSIS — Z170 Estrogen receptor positive status [ER+]: Secondary | ICD-10-CM

## 2019-12-23 DIAGNOSIS — C50812 Malignant neoplasm of overlapping sites of left female breast: Secondary | ICD-10-CM

## 2019-12-23 NOTE — Telephone Encounter (Signed)
[name]; Patient left message stating her PCP wants to start her on Spironaldactone or Finesteride for hair loss and Ozempic or Trulicity for weight loss.  She asks if they are ok to take on study and with Ribociclib.  Patient also reports her last injection of goserelin on 12/13/19 was very painful and now she has a large bruise and small raised area at the site.   Called patient back and LVM informing that the above medications are not prohibited on study and there are no know drug interactions between any of those medications and the study drug, Ribociclib.  Asked patient to observe injection site for any redness, warmth, swelling or signs of infection and to call back to notify Dr. Keane Scrape.  Instructed patient she can use warm compresses to the site several times a day if it is still painful. Advised to take a picture so she can monitor if it gets any worse and to show to nurse/Dr. Magrinat on next visit.  Also asked patient if she is still having menstrual cycles and if not when was her last period? Informed patient if she has not had a period for at least one year, Dr. Jana Hakim could check her hormone levels, FSH and Estradiol, to see if they are in the post-menopausal range.  If she is determined to be post-menopausal then the injections (goserelin) can be discontinued.  Asked patient to return call to discuss.  Foye Spurling, BSN, RN Clinical Research Nurse 12/23/2019 10:14 AM

## 2019-12-23 NOTE — Telephone Encounter (Signed)
Patient returned call and states she received VM informing her about the medications allowed on study.  Patient reported that she has not had a period since being diagnosed with breast cancer in 2018.  Informed patient the study recommends checking menopausal status by checking FSH and Estradiol levels. Will request order from Dr. Jana Hakim for patient's next visit.   Patient denies any signs of infection at injection site.  She reports a large bruise and a bump. Discussed that the bump she feels could be the actual implant she is feeling or a hematoma.  Patient took pictures to monitor for any worsening symptoms and will call back if it gets worse. Thanked patient for returning call and encouraged her to call again if any questions or problems before next visit which is scheduled for 01/09/20. Patient verbalized understanding.  Foye Spurling, BSN, RN Clinical Research Nurse 12/23/2019 4:32 PM

## 2019-12-26 ENCOUNTER — Other Ambulatory Visit: Payer: Self-pay | Admitting: Oncology

## 2019-12-26 DIAGNOSIS — C50212 Malignant neoplasm of upper-inner quadrant of left female breast: Secondary | ICD-10-CM

## 2019-12-26 DIAGNOSIS — Z170 Estrogen receptor positive status [ER+]: Secondary | ICD-10-CM

## 2019-12-28 ENCOUNTER — Ambulatory Visit (INDEPENDENT_AMBULATORY_CARE_PROVIDER_SITE_OTHER): Payer: 59 | Admitting: Family Medicine

## 2019-12-28 ENCOUNTER — Other Ambulatory Visit: Payer: Self-pay

## 2019-12-28 ENCOUNTER — Encounter (INDEPENDENT_AMBULATORY_CARE_PROVIDER_SITE_OTHER): Payer: Self-pay | Admitting: Family Medicine

## 2019-12-28 VITALS — BP 123/80 | HR 73 | Temp 98.1°F | Ht 65.0 in | Wt 262.0 lb

## 2019-12-28 DIAGNOSIS — R7303 Prediabetes: Secondary | ICD-10-CM | POA: Diagnosis not present

## 2019-12-28 DIAGNOSIS — Z6841 Body Mass Index (BMI) 40.0 and over, adult: Secondary | ICD-10-CM | POA: Diagnosis not present

## 2019-12-28 DIAGNOSIS — E559 Vitamin D deficiency, unspecified: Secondary | ICD-10-CM

## 2019-12-28 DIAGNOSIS — E7849 Other hyperlipidemia: Secondary | ICD-10-CM | POA: Diagnosis not present

## 2019-12-28 DIAGNOSIS — E66813 Obesity, class 3: Secondary | ICD-10-CM

## 2019-12-28 NOTE — Progress Notes (Signed)
Chief Complaint:   OBESITY Mackenzie [name] is here to discuss her progress with her obesity treatment plan along with follow-up of her obesity related diagnoses. Mackenzie [name] is on the Category 2 Plan and states she is following her eating plan approximately 75-80% of the time. Mackenzie [name] states she is doing Zumba and/or water aerobics for 45 minutes 3 times per week.  Today's visit was #: 8 Starting weight: 267 lbs  Starting date: 08/09/2019 Today's weight: 262 lbs Today's date: 12/29/2019 Total lbs lost to date: 5 lbs Total lbs lost since last in-office visit: 0  Interim History: Mackenzie [name] says that for snacks she is having vegan 100 calorie protein shakes.  Her hunger is controlled for the most part.  Cravings are also well-controlled.  She says she does not skip foods or meals most days.  On Saturday mornings, she does Zumba for 45-60 minutes and water aerobics for 45 minutes 2 days a week.  She does not eat all her breakfast at once and scatters it throughout the morning and at then is not getting all of it in.  She has occasional late morning hunger.  Subjective:   1. Prediabetes Mackenzie [name] has a diagnosis of prediabetes based on her elevated HgA1c and was informed this puts her at greater risk of developing diabetes. She continues to work on diet and exercise to decrease her risk of diabetes. She denies nausea or hypoglycemia.  We have her on metformin.  She takes 1 tablet around 1-3 pm and 2 tablet around 8-9 pm.  That is what fits her schedule.    Lab Results  Component Value Date   HGBA1C 5.9 (H) 08/09/2019   Lab Results  Component Value Date   INSULIN 12.6 08/09/2019   2. Vitamin D deficiency Mackenzie [name]'s Vitamin D level was 92.5 on 08/09/2019. She is currently taking a multivitamin. She denies nausea, vomiting or muscle weakness.  At last check, her vitamin D was 92.5, and she was told to stop her vitamin D supplement.  3. Other hyperlipidemia Mackenzie [name] has hyperlipidemia and has been trying to  improve her cholesterol levels with intensive lifestyle modification including a low saturated fat diet, exercise and weight loss. She denies any chest pain, claudication or myalgias.  Elevated LDL at last check in 08/2019.  She is not on medication.  Lab Results  Component Value Date   ALT 35 10/14/2019   AST 28 10/14/2019   GGT 28 10/14/2019   ALKPHOS 131 (H) 10/14/2019   BILITOT 0.4 10/14/2019   Lab Results  Component Value Date   CHOL 191 08/09/2019   HDL 68 08/09/2019   LDLCALC 115 (H) 08/09/2019   TRIG 42 08/09/2019   Assessment/Plan:   1. Prediabetes Discussed labs with patient today.  Mackenzie [name] will continue to work on weight loss, exercise, and decreasing simple carbohydrates to help decrease the risk of diabetes.  Check A1c at next office visit.  Change dosing schedule of metformin.  Continue prudent nutritional plan, weight loss.  2. Vitamin D deficiency Discussed labs with patient today.  Low Vitamin D level contributes to fatigue and are associated with obesity, breast, and colon cancer.  Recheck vitamin D at next office visit.  Continue to hold vitamin D supplement until levels are rechecked.  3. Other hyperlipidemia Discussed labs with patient today.  Cardiovascular risk and specific lipid/LDL goals reviewed.  We discussed several lifestyle modifications today and Mackenzie [name] will continue to work on diet, exercise and weight loss efforts. Orders and follow up as  documented in patient record.  Check lipid panel at next office visit.  She will come fasting.  Continue prudent nutritional plan, weight loss.  Counseling Intensive lifestyle modifications are the first line treatment for this issue. . Dietary changes: Increase soluble fiber. Decrease simple carbohydrates. . Exercise changes: Moderate to vigorous-intensity aerobic activity 150 minutes per week if tolerated. . Lipid-lowering medications: see documented in medical record.  4. Class 3 severe obesity with serious  comorbidity and body mass index (BMI) of 40.0 to 44.9 in adult, unspecified obesity type (HCC) Mackenzie [name] is currently in the action stage of change. As such, her goal is to continue with weight loss efforts. She has agreed to the Category 2 Plan.   Exercise goals: As is.  Behavioral modification strategies: increasing water intake, no skipping meals and planning for success.  Mackenzie [name] will try to eat breakfast in the morning - all the food and change metformin dose to right after breakfast and second dose right after lunch.  Mackenzie [name] has agreed to follow-up with our clinic in 2 weeks, fasting. She was informed of the importance of frequent follow-up visits to maximize her success with intensive lifestyle modifications for her multiple health conditions.   Objective:   Blood pressure 123/80, pulse 73, temperature 98.1 F (36.7 C), height 5\' 5"  (1.651 m), weight 262 lb (118.8 kg), last menstrual period 02/10/2017, SpO2 98 %, unknown if currently breastfeeding. Body mass index is 43.6 kg/m.  General: Cooperative, alert, well developed, in no acute distress. HEENT: Conjunctivae and lids unremarkable. Cardiovascular: Regular rhythm.  Lungs: Normal work of breathing. Neurologic: No focal deficits.   Lab Results  Component Value Date   CREATININE 0.99 10/14/2019   BUN 19 10/14/2019   NA 137 10/14/2019   K 4.0 10/14/2019   CL 103 10/14/2019   CO2 24 10/14/2019   Lab Results  Component Value Date   ALT 35 10/14/2019   AST 28 10/14/2019   GGT 28 10/14/2019   ALKPHOS 131 (H) 10/14/2019   BILITOT 0.4 10/14/2019   Lab Results  Component Value Date   HGBA1C 5.9 (H) 08/09/2019   HGBA1C 5.9 (H) 07/26/2018   Lab Results  Component Value Date   INSULIN 12.6 08/09/2019   Lab Results  Component Value Date   TSH 0.657 08/09/2019   Lab Results  Component Value Date   CHOL 191 08/09/2019   HDL 68 08/09/2019   LDLCALC 115 (H) 08/09/2019   TRIG 42 08/09/2019   Lab Results  Component  Value Date   WBC 4.5 10/14/2019   HGB 12.5 10/14/2019   HCT 38.1 10/14/2019   MCV 89.6 10/14/2019   PLT 201 10/14/2019   Attestation Statements:   Reviewed by clinician on day of visit: allergies, medications, problem list, medical history, surgical history, family history, social history, and previous encounter notes.  Time spent on visit including pre-visit chart review and post-visit care and charting was 30 minutes.   I, Water quality scientist, CMA, am acting as Location manager for [employer], DO.  I have reviewed the above documentation for accuracy and completeness, and I agree with the above. Mellody Dance, DO

## 2019-12-30 ENCOUNTER — Other Ambulatory Visit: Payer: Self-pay | Admitting: Oncology

## 2019-12-30 MED FILL — SYMBICORT 80-4.5 MCG INH: 80-4.5 | 30 days supply | Qty: 10 | Fill #0

## 2019-12-30 MED FILL — GABAPENTIN 300 MG CAPSULE: 300 | 90 days supply | Qty: 90 | Fill #0

## 2020-01-06 ENCOUNTER — Telehealth: Payer: Self-pay | Admitting: *Deleted

## 2020-01-06 DIAGNOSIS — E559 Vitamin D deficiency, unspecified: Secondary | ICD-10-CM

## 2020-01-06 DIAGNOSIS — R7303 Prediabetes: Secondary | ICD-10-CM

## 2020-01-06 DIAGNOSIS — E7849 Other hyperlipidemia: Secondary | ICD-10-CM

## 2020-01-06 NOTE — Telephone Encounter (Signed)
Mackenzie [name]- Patient LVM requesting Dr. Jana Hakim add on labs Monday for the weight loss clinic so she will not have to fast twice for labs next week. She has appointment later in the week for the weight loss clinic and per their most recent progress notes they plan to collect blood for Lipid panel, Hgb A1C and Vitamin D on next appointment.  Labs added per Dr. Jana Hakim to be drawn in addition to the [name] required labs Monday morning.  Called patient back and informed her of lab orders added. Reminded patient to fast for at least 8 hrs prior to her lab appointment Monday morning.  Reminded patient to not take Letrozole at home that morning and to bring in all [name] medication bottles and diaries. Patient verbalized understanding.   Foye Spurling, BSN, RN Clinical Research Nurse 01/06/2020 11:33 AM

## 2020-01-08 NOTE — Progress Notes (Signed)
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  Her study nurse was also present during the visit  Mackenzie [name] is on study with [name]--Ribociclb and anti estrogen therapy with Letrozole daily.  She is tolerating these medications without any major side effects: Hot flashes are mild although she does feel a little bit warmer in the evening, she reports.  Vaginal dryness is not a major issue.  She also continues on goserelin every 4 weeks.  We are checking her FSH and estradiol to see if she may get off this assuming she is postmenopausal at this point.  Her most recent bone density screening on 03/16/2018, showed a T-score of -0.7, which is considered normal.    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  However she is not being exposed to patients with COVID-19.  She is doing training, paperwork, and other activities and toward.  [facility] and has a trainer once a week.  She is continuing to work on the left breast with Dr. Ulice Bold to make the breasts more  even she says.  Otherwise a detailed review of systems today was noncontributory   HISTORY OF CURRENT ILLNESS: From the original intake note:  The patient herself noted some changes in her left breast late July 2018, she says, and eventually brought this to medical attention so that on 01/09/2017 she underwent bilateral diagnostic mammography with tomography and left breast ultrasonography at the breast Center. This found the breast density to be category C. In the upper inner quadrant of the left breast there was an area of asymmetry and there were malignant type calcifications involving all 4 quadrants. On exam there is firmness and palpable thickening in the anterior left breast with skin dimpling. Ultrasonography found at the 9:30 o'clock radiant 3 cm from the nipple a 2.7 cm mass and in the left axilla for abnormal lymph nodes largest of which measured 2.7 cm.  Biopsy of the left breast 9:30 o'clock mass 80 01/26/2017 showed ([id number]) invasive ductal carcinoma, with extracellular mucin, grade 1 or 2. In the lower outer left breast is separated biopsy the same day showed ductal carcinoma in situ. One of the 4 lymph nodes involved was positive for metastatic carcinoma. Prognostic panel on the invasive disease showed it to be estrogen receptor 100% positive, progesterone receptor 20% positive, both with strong staining intensity, with an MIB-1 of 20%, and no HER-2 amplification with a signals ratio 1.38 and the number per cell 2.90.  On 01/22/2017 the patient underwent bilateral breast MRI. This showed no involvement of the right breast, but in the left breast there was a masslike and non-masslike enhancement involving all quadrants, with skin swelling but  no abnormal enhancement of the skin or nipple areolar complex or pectoralis muscle. The mass could not be clearly measured but spanned approximately 12 cm. There was bulky left axillary lymphadenopathy, with the largest lymph node measuring up to  3.4 cm.  The patient's subsequent history is as detailed below.   PAST MEDICAL HISTORY: Past Medical History:  Diagnosis Date  . Abnormal glucose 2018  . 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 Allergic rhinitis 2012  . Anemia 01/27/2017   prior to starting chemotherapy  . Breast cancer ([place]) 01/14/2017   Left breast  . 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 Edema, lower extremity   . Hot flashes 03/2017  . Hyperlipidemia 03/20/2016  . Morbid obesity with body mass index (BMI) of 40.0 to 49.9 ([place]) 09/17/2017  . NCGS (non-celiac gluten sensitivity)   . Pre-diabetes   . Seasonal allergies 2012   seasonal allergies causes allergic rhinitis and itchy, dry eyes per pt  . Vitamin D deficiency 05/2015    PAST SURGICAL HISTORY: Past Surgical History:  Procedure Laterality Date  . BREAST RECONSTRUCTION WITH PLACEMENT OF TISSUE EXPANDER AND FLEX HD (ACELLULAR HYDRATED DERMIS) Left 08/27/2017   Procedure: LEFT BREAST RECONSTRUCTION WITH PLACEMENT OF TISSUE EXPANDER AND FLEX HD;  Surgeon: Wallace Going, DO;  Location: [place];  Service: Plastics;  Laterality: Left;  . CESAREAN SECTION     x2  . LATISSIMUS FLAP TO BREAST Left 11/03/2018  . LATISSIMUS FLAP TO BREAST Left 11/03/2018   Procedure: LATISSIMUS FLAP TO LEFT BREAST;  Surgeon: Wallace Going, DO;  Location: [place];  Service: Plastics;  Laterality: Left;  Marland Kitchen MASTECTOMY MODIFIED RADICAL Left 08/27/2017  . MASTECTOMY MODIFIED RADICAL Left 08/27/2017   Procedure: LEFT MODIFIED RADICAL MASTECTOMY;  Surgeon: Erroll Luna, MD;  Location: [place];  Service: General;  Laterality: Left;  Marland Kitchen MASTOPEXY Right 03/03/2019   Procedure: RIGHT BREAST MASTOPEXY/REDUCTION;  Surgeon: Wallace Going, DO;  Location: [place];  Service: Plastics;  Laterality: Right;  3 hours, please  . PORT-A-CATH REMOVAL Right 08/27/2017   Procedure: REMOVAL PORT-A-CATH RIGHT CHEST;  Surgeon:  Erroll Luna, MD;  Location: [place];  Service: General;  Laterality: Right;  . PORTACATH PLACEMENT Right 01/28/2017   Procedure: INSERTION PORT-A-CATH WITH ULTRASOUND;  Surgeon: Erroll Luna, MD;  Location: [place];  Service: General;  Laterality: Right;  . REDUCTION MAMMAPLASTY Right   . REMOVAL OF TISSUE EXPANDER AND PLACEMENT OF IMPLANT Left 03/03/2019   Procedure: REMOVAL OF TISSUE EXPANDER AND PLACEMENT OF EXPANDER;  Surgeon: Wallace Going, DO;  Location: [place];  Service: Plastics;  Laterality: Left;  . REMOVAL OF TISSUE EXPANDER AND PLACEMENT OF IMPLANT Left 05/25/2019   Procedure: LEFT BREAST REMOVAL OF TISSUE EXPANDER AND PLACEMENT OF IMPLANT;  Surgeon: Wallace Going, DO;  Location: [place];  Service: Plastics;  Laterality: Left;  . TISSUE EXPANDER PLACEMENT Left 11/03/2018   Procedure: PLACEMENT OF TISSUE EXPANDER LEFT BREAST;  Surgeon: Wallace Going, DO;  Location: [place];  Service: Plastics;  Laterality: Left;  Total case time is 3.5 hours  . TUBAL LIGATION Bilateral 01/21/2011    FAMILY HISTORY Family History  Problem Relation Age of Onset  . Breast cancer Paternal Grandmother 73       d.60s from breast cancer. Did not have treatment.  . Other Mother        Z.66 from complications of surgery  to remove brain tumor  . Diabetes Mother   . Hypertension Mother   . Stroke Mother   . Kidney disease Father   . Hypertension Other   . Diabetes Other   . Stroke Other    The patient's father still alive at age 92. The patient's mother died with a brain tumor which the patient says was "benign". She was 56. The patient has one brother, no sisters. The only breast cancer in the family is a paternal grandmother who died from breast cancer at an unknown age. There is no history of ovarian or prostate cancer in the family.   GYNECOLOGIC HISTORY:  Patient's last menstrual period was 02/10/2017. Menarche age 86  and first live birth age 60 she is [place] P4. The patient is still having regular periods as of August 2018   SOCIAL HISTORY: (Updated 05/31/2018) Mackenzie [name] works at [employer] on the fifth floor, [place]. She is separated from her husband (as of 05/2018), [name], who is a Building control surveyor.  1 daughter is married and lives in [place] and works as a Education administrator. Mackenzie [name]'s second daughter, Mackenzie [name], lives in [place] and works in the police department. Daughters, [place] and [place] are 18 and 6, living at home. The patient has a grandson she looks after. She attends a [employer].     ADVANCED DIRECTIVES: Not in place   HEALTH MAINTENANCE: Social History   Tobacco Use  . Smoking status: Never Smoker  . Smokeless tobacco: Never Used  Vaping Use  . Vaping Use: Never used  Substance Use Topics  . Alcohol use: Yes    Comment: social  . Drug use: No    Colonoscopy: Never  PAP: December 2017  Bone density: Never   Allergies  Allergen Reactions  . Dilaudid [Hydromorphone] Itching    Current Outpatient Medications  Medication Sig Dispense Refill  . acetaminophen (TYLENOL) 500 MG tablet Take 1,000 mg by mouth every 6 (six) hours as needed (for pain/headaches.).     Marland Kitchen B Complex Vitamins (VITAMIN B COMPLEX PO) Take 1 capsule by mouth 2 (two) times daily.     . budesonide-formoterol (SYMBICORT) 80-4.5 MCG/ACT inhaler Inhale 2 puffs into the lungs every morning.     . Calcium Carb-Cholecalciferol (CALCIUM 600+D3 PO) Take 1 tablet by mouth 2 (two) times daily.     . cetirizine (ZYRTEC) 10 MG tablet Take 10 mg by mouth daily.      Marland Kitchen gabapentin (NEURONTIN) 300 MG capsule TAKE 1 CAPSULE (300 MG TOTAL) BY MOUTH AT BEDTIME. 90 capsule 4  . hydroxypropyl methylcellulose / hypromellose (ISOPTO TEARS / GONIOVISC) 2.5 % ophthalmic solution Place 1 drop into both eyes daily as needed for dry eyes.     Marland Kitchen ibuprofen (ADVIL,MOTRIN) 200 MG tablet Take 800 mg by mouth every 8 (eight) hours as needed  for mild pain (for pain.).     . Investigational ribociclib (KISQALI) 200 MG tablet [name] Take 2 tablets (400 mg total) by mouth daily. Take 2 tablets ('400mg'$  total) by mouth daily on days 1-21.  Repeat every 28 days. 225 tablet 0  . letrozole (FEMARA) 2.5 MG tablet Take 1 tablet (2.5 mg total) by mouth daily. 90 tablet 4  . loratadine (CLARITIN) 10 MG tablet Take 1 tablet (10 mg total) by mouth daily. (Patient taking differently: Take 10 mg by mouth daily as needed for allergies. ) 90 tablet 2  . metFORMIN (GLUCOPHAGE) 500 MG tablet Take 1 tablet (500 mg total) by mouth every morning. (Patient  taking differently: Take 500 mg by mouth every morning. Taking '500mg'$  bid per patient) 30 tablet 0  . Multiple Vitamin (MULTIVITAMIN WITH MINERALS) TABS tablet Take 1 tablet by mouth daily.     . Omega-3 Fatty Acids (FISH OIL) 1000 MG CAPS Take 1,000 mg by mouth 2 (two) times daily.     . phenylephrine (SUDAFED PE) 10 MG TABS tablet Take 10 mg by mouth every 4 (four) hours as needed.     . Probiotic Product (PROBIOTIC DAILY PO) Take 1 capsule by mouth 2 (two) times daily.     . Turmeric 450 MG CAPS Take 450 mg by mouth 2 (two) times daily.    Marland Kitchen ZINC OXIDE PO Take 1 tablet by mouth 2 (two) times a day.     No current facility-administered medications for this visit.   Facility-Administered Medications Ordered in Other Visits  Medication Dose Route Frequency Provider Last Rate Last Admin  . sodium chloride flush (NS) 0.9 % injection 10 mL  10 mL Intravenous PRN Henchy Mccauley, [name], MD   10 mL at 03/10/17 1239    OBJECTIVE: African-American woman who appears well Vitals:   01/09/20 1243  BP: 132/82  Pulse: 68  Resp: 20  Temp: 98.2 F (36.8 C)  SpO2: 100%   Wt Readings from Last 3 Encounters:  01/09/20 268 lb 8 oz (121.8 kg)  12/28/19 262 lb (118.8 kg)  12/14/19 261 lb (118.4 kg)   Body mass index is 44.68 kg/m.   ECOG FS: 1  Sclerae unicteric, EOMs intact Wearing a mask No cervical or  supraclavicular adenopathy Lungs no rales or rhonchi Heart regular rate and rhythm Abd soft, nontender, positive bowel sounds MSK no focal spinal tenderness, no upper extremity lymphedema Neuro: nonfocal, well oriented, appropriate affect Breasts: The right breast is status post reduction mammoplasty.  It is otherwise unremarkable.  The left breast is status post mastectomy and left isthmus reconstruction.  There is no evidence of local recurrence.  Both axillae are benign.   LAB RESULTS:  Appointment on 01/09/2020  Component Date Value Ref Range Status  . Amylase 01/09/2020 78  28 - 100 U/L Final   Performed at [facility], [place] [address] [place], [place] [address]  . Bilirubin, Direct 01/09/2020 <0.1  0.0 - 0.2 mg/dL Final   Performed at [place] [address] [place],  [address]  . WBC Count 01/09/2020 3.7* 4.0 - 10.5 K/uL Final  . RBC 01/09/2020 4.08  3.87 - 5.11 MIL/uL Final  . Hemoglobin 01/09/2020 12.0  12.0 - 15.0 g/dL Final  . HCT 01/09/2020 36.3  36 - 46 % Final  . MCV 01/09/2020 89.0  80.0 - 100.0 fL Final  . MCH 01/09/2020 29.4  26.0 - 34.0 pg Final  . MCHC 01/09/2020 33.1  30.0 - 36.0 g/dL Final  . RDW 01/09/2020 13.9  11.5 - 15.5 % Final  . Platelet Count 01/09/2020 206  150 - 400 K/uL Final  . nRBC 01/09/2020 0.0  0.0 - 0.2 % Final  . Neutrophils Relative % 01/09/2020 52  % Final  . Neutro Abs 01/09/2020 1.9  1.7 - 7.7 K/uL Final  . Lymphocytes Relative 01/09/2020 32  % Final  . Lymphs Abs 01/09/2020 1.2  0.7 - 4.0 K/uL Final  . Monocytes Relative 01/09/2020 14  % Final  . Monocytes Absolute 01/09/2020 0.5  0 - 1 K/uL Final  . Eosinophils Relative 01/09/2020 1  % Final  . Eosinophils Absolute  01/09/2020 0.1  0 - 0 K/uL Final  . Basophils Relative 01/09/2020 1  % Final  . Basophils Absolute 01/09/2020 0.0  0 - 0 K/uL Final  . Immature Granulocytes 01/09/2020 0  % Final  . Abs Immature Granulocytes 01/09/2020  0.01  0.00 - 0.07 K/uL Final   Performed at [facility], [place] [address] [place], [place] [address]  . Sodium 01/09/2020 134* 135 - 145 mmol/L Final  . Potassium 01/09/2020 3.8  3.5 - 5.1 mmol/L Final  . Chloride 01/09/2020 101  98 - 111 mmol/L Final  . CO2 01/09/2020 26  22 - 32 mmol/L Final  . Glucose, Bld 01/09/2020 93  70 - 99 mg/dL Final   Glucose reference range applies only to samples taken after fasting for at least 8 hours.  . BUN 01/09/2020 13  6 - 20 mg/dL Final  . Creatinine 01/09/2020 0.93  0.44 - 1.00 mg/dL Final  . Calcium 01/09/2020 9.9  8.9 - 10.3 mg/dL Final  . Total Protein 01/09/2020 7.3  6.5 - 8.1 g/dL Final  . Albumin 01/09/2020 3.6  3.5 - 5.0 g/dL Final  . AST 01/09/2020 26  15 - 41 U/L Final  . ALT 01/09/2020 34  0 - 44 U/L Final  . Alkaline Phosphatase 01/09/2020 105  38 - 126 U/L Final  . Total Bilirubin 01/09/2020 0.3  0.3 - 1.2 mg/dL Final  . GFR, Est Non Af Am 01/09/2020 >60  >60 mL/min Final  . GFR, Est AFR Am 01/09/2020 >60  >60 mL/min Final  . Anion gap 01/09/2020 7  5 - 15 Final   Performed at [facility], [place] [address], [place], [place] [address]  . GGT 01/09/2020 22  7 - 50 U/L Final   Performed at [facility], [place] [address] [place], [place] [address]  . LDH 01/09/2020 196* 98 - 192 U/L Final   Performed at [facility], [place] [address], [place], [place] [address]  . Lipase 01/09/2020 23  11 - 51 U/L Final   Performed at [facility], [place] [address] [place], [place] [address]  . Magnesium 01/09/2020 1.8  1.7 - 2.4 mg/dL Final   Performed at [facility], [place] [address] [place], [place] [address]  . Phosphorus 01/09/2020 3.7  2.5 - 4.6 mg/dL Final   Performed at [place] [address], [place], [place] [address]  . Uric Acid, Serum 01/09/2020 4.4  2.5 - 7.1 mg/dL Final   Performed at  [facility], [place] [address] [place], [place] [address]  . Hgb A1c MFr Bld 01/09/2020 5.6  4.8 - 5.6 % Final   Comment: (NOTE) Pre diabetes:          5.7%-6.4%  Diabetes:              >6.4%  Glycemic control for   <7.0% adults with diabetes   . Mean Plasma Glucose 01/09/2020 114.02  mg/dL Final   Performed at [place] [address], [place], [place] [address]     STUDIES: No results found.   ELIGIBLE FOR AVAILABLE RESEARCH PROTOCOL: [name], ASA study   ASSESSMENT: 49 y.o. [place] woman status post left breast upper inner quadrant biopsy 01/14/2017 for a clinical T3 N2, stage IIA invasive ductal carcinoma, grade 1 or 2, estrogen and progesterone receptor positive, HER-2 nonamplified, with an MIB-1 of 20%.  (1) staging  studies: Brain MRI, bone scan, and CT scan of the chest 02/05/2017 showed no brain lesions, no lung or liver lesions, a 4.9 cm mass in the left breast with left axillary and subpectoral adenopathy, and nonspecific bone scan tracer at L2, left scapula, and anterior ribs, with lumbar spine MRI suggested for further evaluation.  (a) lumbar spine MRI 02/17/2017 showed no normal bone lesions.  There was mild lumbar spondylosis  (2) neoadjuvant chemotherapy consisting of cyclophosphamide and doxorubicin in dose dense fashion 4 started 02/10/2017, completed 03/24/2017, followed by weekly carboplatin and gemcitabine given days 1 and 8 of each 21-day cycle starting 04/14/2017, completing the planned 4 cycles 06/26/2017  (3) is post left modified radical mastectomy on 08/27/2017 showing an mpT3 pN2 residual invasive ductal carcinoma, grade 2, with a residual cancer burden of 3.  Margins were clear  (a) latissimus flap reconstruction with expander 11/03/2018  (4) postmastectomy radiation completed 01/01/2018  (a) capecitabine radiosensitization 11/16/2017-01/01/2018  (5) goserelin started 09/21/2017  (a) letrozole started 01/11/2018  (b)  enrolled in [employer] clinical trial, randomized to ribociclib on 05/31/2018  (c) Bone density on 03/16/2018 was normal with T score of -0.7 in the L1-L4 spine   (6) genetics testing 03/24/2017 through the Common Hereditary Cancer Panel offered by [facility] found no deleterious mutations in APC, ATM, AXIN2, BARD1, BMPR1A, BRCA1, BRCA2, BRIP1, CDH1, CDKN2A (p14ARF), CDKN2A (p16INK4a), CHEK2, CTNNA1, DICER1, EPCAM (Deletion/duplication testing only), GREM1 (promoter region deletion/duplication testing only), KIT, MEN1, MLH1, MSH2, MSH3, MSH6, MUTYH, NBN, NF1, NHTL1, PALB2, PDGFRA, PMS2, POLD1, POLE, PTEN, RAD50, RAD51C, RAD51D, SDHB, SDHC, SDHD, SMAD4, SMARCA4. STK11, TP53, TSC1, TSC2, and VHL.  The following genes were evaluated for sequence changes only: SDHA and HOXB13 c.251G>A variant only.    (7) Right breast reduction and left breast expander placement on 03/03/2019  (a) Implant exchanged on 05/25/2019 or a [place] Smooth Round Ultra High Profile Gel 750cc. Ref #[phone].  Serial Number [id number]   PLAN: Clydene is now 2-1/2 years out from definitive surgery for breast cancer with no evidence of disease recurrence.  This is very favorable.  She is tolerating her letrozole and ribociclib remarkably well, with no significant side effects.  She is also now in a with weight losing, increased activity program which is benefiting her greatly.  I am delighted to see how well she is doing.  She will return to see our nurse practitioner in 3 and then will see me again in 6  She knows to call for any other issue that may develop before the next visit  Total encounter time 25 minutes.  [name]. Tyjanae Bartek, MD  01/09/20 12:57 PM Medical Oncology and Hematology [facility], [place] [address] Tel. [phone]    Fax. [phone]   I, Wilburn Mylar, am acting as scribe for Dr. [name]. Makenna Macaluso.  I, Lurline Del MD, have reviewed the above documentation for  accuracy and completeness, and I agree with the above.   *Total Encounter Time as defined by the Centers for Medicare and Medicaid Services includes, in addition to the face-to-face time of a patient visit (documented in the note above) non-face-to-face time: obtaining and reviewing outside history, ordering and reviewing medications, tests or procedures, care coordination (communications with other health care professionals or caregivers) and documentation in the medical record.

## 2020-01-09 ENCOUNTER — Other Ambulatory Visit: Payer: Self-pay

## 2020-01-09 ENCOUNTER — Inpatient Hospital Stay: Payer: 59 | Attending: Adult Health

## 2020-01-09 ENCOUNTER — Inpatient Hospital Stay: Payer: 59

## 2020-01-09 ENCOUNTER — Encounter: Payer: Self-pay | Admitting: *Deleted

## 2020-01-09 ENCOUNTER — Inpatient Hospital Stay: Payer: 59 | Admitting: Oncology

## 2020-01-09 VITALS — BP 128/75 | HR 68 | Resp 18

## 2020-01-09 VITALS — BP 132/82 | HR 68 | Temp 98.2°F | Resp 20 | Ht 65.0 in | Wt 268.5 lb

## 2020-01-09 DIAGNOSIS — R7303 Prediabetes: Secondary | ICD-10-CM

## 2020-01-09 DIAGNOSIS — Z006 Encounter for examination for normal comparison and control in clinical research program: Secondary | ICD-10-CM

## 2020-01-09 DIAGNOSIS — E7849 Other hyperlipidemia: Secondary | ICD-10-CM

## 2020-01-09 DIAGNOSIS — C50812 Malignant neoplasm of overlapping sites of left female breast: Secondary | ICD-10-CM

## 2020-01-09 DIAGNOSIS — Z803 Family history of malignant neoplasm of breast: Secondary | ICD-10-CM | POA: Diagnosis not present

## 2020-01-09 DIAGNOSIS — C773 Secondary and unspecified malignant neoplasm of axilla and upper limb lymph nodes: Secondary | ICD-10-CM

## 2020-01-09 DIAGNOSIS — Z9012 Acquired absence of left breast and nipple: Secondary | ICD-10-CM | POA: Insufficient documentation

## 2020-01-09 DIAGNOSIS — Z170 Estrogen receptor positive status [ER+]: Secondary | ICD-10-CM

## 2020-01-09 DIAGNOSIS — Z923 Personal history of irradiation: Secondary | ICD-10-CM | POA: Insufficient documentation

## 2020-01-09 DIAGNOSIS — E559 Vitamin D deficiency, unspecified: Secondary | ICD-10-CM

## 2020-01-09 DIAGNOSIS — Z5111 Encounter for antineoplastic chemotherapy: Secondary | ICD-10-CM | POA: Diagnosis not present

## 2020-01-09 DIAGNOSIS — Z9221 Personal history of antineoplastic chemotherapy: Secondary | ICD-10-CM | POA: Diagnosis not present

## 2020-01-09 DIAGNOSIS — C50912 Malignant neoplasm of unspecified site of left female breast: Secondary | ICD-10-CM

## 2020-01-09 DIAGNOSIS — Z79811 Long term (current) use of aromatase inhibitors: Secondary | ICD-10-CM | POA: Diagnosis not present

## 2020-01-09 DIAGNOSIS — C50212 Malignant neoplasm of upper-inner quadrant of left female breast: Secondary | ICD-10-CM

## 2020-01-09 LAB — LIPASE, BLOOD: Lipase: 23 U/L (ref 11–51)

## 2020-01-09 LAB — CMP (CANCER CENTER ONLY)
ALT: 34 U/L (ref 0–44)
AST: 26 U/L (ref 15–41)
Albumin: 3.6 g/dL (ref 3.5–5.0)
Alkaline Phosphatase: 105 U/L (ref 38–126)
Anion gap: 7 (ref 5–15)
BUN: 13 mg/dL (ref 6–20)
CO2: 26 mmol/L (ref 22–32)
Calcium: 9.9 mg/dL (ref 8.9–10.3)
Chloride: 101 mmol/L (ref 98–111)
Creatinine: 0.93 mg/dL (ref 0.44–1.00)
GFR, Est AFR Am: >60 mL/min (ref >60)
GFR, Estimated: >60 mL/min (ref >60)
Glucose, Bld: 93 mg/dL (ref 70–99)
Potassium: 3.8 mmol/L (ref 3.5–5.1)
Sodium: 134 mmol/L — ABNORMAL LOW (ref 135–145)
Total Bilirubin: 0.3 mg/dL (ref 0.3–1.2)
Total Protein: 7.3 g/dL (ref 6.5–8.1)

## 2020-01-09 LAB — CBC WITH DIFFERENTIAL (CANCER CENTER ONLY)
Abs Immature Granulocytes: 0.01 10*3/uL (ref 0.00–0.07)
Basophils Absolute: 0 10*3/uL (ref 0.0–0.1)
Basophils Relative: 1 %
Eosinophils Absolute: 0.1 10*3/uL (ref 0.0–0.5)
Eosinophils Relative: 1 %
HCT: 36.3 % (ref 36.0–46.0)
Hemoglobin: 12 g/dL (ref 12.0–15.0)
Immature Granulocytes: 0 %
Lymphocytes Relative: 32 %
Lymphs Abs: 1.2 10*3/uL (ref 0.7–4.0)
MCH: 29.4 pg (ref 26.0–34.0)
MCHC: 33.1 g/dL (ref 30.0–36.0)
MCV: 89 fL (ref 80.0–100.0)
Monocytes Absolute: 0.5 10*3/uL (ref 0.1–1.0)
Monocytes Relative: 14 %
Neutro Abs: 1.9 10*3/uL (ref 1.7–7.7)
Neutrophils Relative %: 52 %
Platelet Count: 206 10*3/uL (ref 150–400)
RBC: 4.08 MIL/uL (ref 3.87–5.11)
RDW: 13.9 % (ref 11.5–15.5)
WBC Count: 3.7 10*3/uL — ABNORMAL LOW (ref 4.0–10.5)
nRBC: 0 % (ref 0.0–0.2)

## 2020-01-09 LAB — HEMOGLOBIN A1C
Hgb A1c MFr Bld: 5.6 % (ref 4.8–5.6)
Mean Plasma Glucose: 114.02 mg/dL

## 2020-01-09 LAB — BILIRUBIN, DIRECT: Bilirubin, Direct: <0.1 mg/dL (ref 0.0–0.2)

## 2020-01-09 LAB — VITAMIN D 25 HYDROXY (VIT D DEFICIENCY, FRACTURES): Vit D, 25-Hydroxy: 44.05 ng/mL (ref 30–100)

## 2020-01-09 LAB — PHOSPHORUS: Phosphorus: 3.7 mg/dL (ref 2.5–4.6)

## 2020-01-09 LAB — AMYLASE: Amylase: 78 U/L (ref 28–100)

## 2020-01-09 LAB — MAGNESIUM: Magnesium: 1.8 mg/dL (ref 1.7–2.4)

## 2020-01-09 LAB — URIC ACID: Uric Acid, Serum: 4.4 mg/dL (ref 2.5–7.1)

## 2020-01-09 LAB — LACTATE DEHYDROGENASE: LDH: 196 U/L — ABNORMAL HIGH (ref 98–192)

## 2020-01-09 LAB — GAMMA GT: GGT: 22 U/L (ref 7–50)

## 2020-01-09 MED ORDER — GOSERELIN ACETATE 3.6 MG ~~LOC~~ IMPL
DRUG_IMPLANT | SUBCUTANEOUS | Status: AC
  Filled 2020-01-09: qty 3.6

## 2020-01-09 MED ORDER — GOSERELIN ACETATE 3.6 MG ~~LOC~~ IMPL
3.6000 mg | DRUG_IMPLANT | Freq: Once | SUBCUTANEOUS | Status: AC
  Administered 2020-01-09: 3.6 mg via SUBCUTANEOUS

## 2020-01-09 NOTE — Research (Signed)
C22D1 [name]: Patient into clinic by herself this morning to complete the study activities prior to starting cycles 22, 23 and 24 on study.  Labs scheduled at 8:30 am and MD visit scheduled later at 12:30 pm to allow time for labs to result prior to visit and dosing.  PROs;  Given to patient at registration prior to lab appointment. Collected and checked for completeness and accuracy.   Labs; Collected per protocol.  Patient confirmed she had been fasting for at least 8 hours prior to blood collection. Hormone levels ordered by Dr. Jana Hakim were also collected to assess menopausal status.  Other labs ordered by Dr. Jana Hakim today for patient's weight loss clinic include Hgb A1C, Lipid Panel and Vitamin D.  Patient had requested these labs be drawn along with research labs today so she would not have to fast again later this week for her weight loss clinic labs.  Results reviewed by Dr. Jana Hakim. Results are all within study parameters to continue Ribociclib. Dr. Jana Hakim considers the slightly low sodium and slightly elevated LDH results as clinically insignificant.  Dr. Jana Hakim states the WBC at 3.7 is WNL for African Americans and therefore not considered low.  Corrected Calcium Formula:  4 g/dL utilized for "normal albumin;" local normal range is 3.5-5 g/dL. Corrected Calcium = (0.8 x [normal albumin - patient's albumin]) + serum calcium Correct Calcium = (0.8 x [4-3.6]) + 9.9  Corrected Calcium =  10.22 mg/dl Vitals; Vital signs completed after patient rested more than five minutes. Weight without shoes. See VS flowsheet.  Concomitant Medications; Reviewed medication list with patient. She reports she increased her metformin to twice daily per PCP and her Vitamin D was discontinued by Weight loss clinic MD due to levels being normal.  No other changed to her medication list. Patient states she did not start on any new medications since last visit. Physical Exam/PS - Please see documentation  in today's office visit encounter with Dr. Jana Hakim. Adverse Events;  Patient reports hair loss/thinning of less than 50%. She says this was noted by her PCP. Patient thinks it is due to the way she combs her hair. She has decided to not take any medication for it.  Patient denies any other new or worsening AEs. She says she continues to feel "about the same." See AE table below for full review of AEs and attributions.  Investigational Medication - Cycles 19, 20 and 21 study medication bottles were returned by patient.  Patient reported she misplaced her diaries given to her on last visit.  She requested copies to complete stating she keeps track of when she takes her pills in her phone and then transcribes daily to her diaries. But she could not find the diaries this morning before her appointment.  Copy of the medication diaries that were provided to patient on last visit were given to her this morning to complete. Patient completed the blank copies by referencing the times she recorded in her phone daily.  Instructed patient to keep diaries with her so she can complete them at the same time she takes her study drugs.  Reinforced with patient the intention of the diary is to record in real time as soon as she takes the medication. Patient verbalized understanding.  Study medication bottles were returned to pharmacy today for accounting by pharmacist Kennith Center.  Cycle 19 bottle had 7 pills left out of 45 dispensed which equates to 2 missed days this cycle. Patient's diary reflects 2 missed days of  ribociclib and letrozole.  Cycle 20 bottle had 7  pills left out of 45 dispensed which equates to 2 missed days this cycle. Patient's diary shows 2 missed days of ribociclib and  3 missed doses of letrozole.  Cycle 21 bottle had 5  pills left out of 45 dispensed which equates to 1 missed day this cycle. Patient's diary shows 1 missed day of ribociclib and letrozole this cycle.  Patient states all missed doses of  ribociclib and letrozole were due to forgetting. She did not miss any doses on purpose or for any AEs.  We again discussed strategies to improve compliance with daily ribociclib and letrozole. Some suggestions included setting a daily timer on patient's watch, switching to a different time of day that might be easier to remember and less hectic such as at bedtime, taking the same time of day as other daily medication and keeping diaries in visible location she will see daily.  Patient verbalized understanding of importance of compliance with study drug.  She admits it is difficult to remember every day since she takes them in the afternoons often while she is at work. She will consider taking at a different time of day if she continues to forget.  3 new bottles of investigational drug (to cover cycles 22 through 24) were assigned by I[facility] as kit #[id number], #[id number] and [phone]. These bottles were was dispensed by pharmacist Kennith Center today continuing at the full dose of 400 mg daily. Patient was provided with the bottles and 3 copies of the patient diary for recording medication doses during cycles 22 through 24.  Patient self-administered ribociclib and letrozole in clinic under the supervision of the research nurse at 2:13 pm.  Patient filled in diary dosing for today with this time. Patient understands that once a bottle of ribociclib is opened it should not be used after 30 days. Patient was instructed to open a new bottle with each cycle as marked, filling in the date opened, and to retain the unopened and partial bottles for return at her next clinic visit. She verbalized understanding. Plan/Scheduling - Patient is scheduled for goserelin injections every 4 weeks. Next study visit is due prior to cycle 25 on 04/02/20.  Patient prefers to come in the Friday morning before for labs instead of early the same day as MD appointment/ study visit.  Informed patient that if her hormone levels indicate she is post  menopausal then we can cancel the monthly injections. Today patient will get planned injection since these lab results are not available yet. Patient verbalized understanding.  Patient is aware that she should contact the clinic at any time if she has any questions or concerns prior to next visit.  Thanked patient for her ongoing participation in this clnical trial.    AE Table: Event Grade Onset Date End Date Status Comments Attribution to Ribociclib Attribution to Letrozole Attribution to Goserelin  Allergic Rhinitis 2 2012  ongoing At Baseline n/a n/a n/a  Constipation 1 08/27/17  ongoing At Baseline n/a n/a n/a  Cough, intermittent 2 05/09/18  ongoing At Baseline n/a n/a n/a  GERD, intermittent 1 2018  ongoing At Baseline n/a n/a n/a  Gluten Sensitivity 1 12/2017  ongoing At Baseline n/a n/a n/a  Headaches 1 02/2017  ongoing At Baseline n/a n/a n/a  Hot Flashes 2 03/2017  ongoing At Baseline n/a n/a n/a  Hyperglycemia 1 2018  ongoing At Baseline n/a n/a n/a  Hyperlipidemia 1 03/2016  ongoing At Baseline n/a  n/a n/a  Vitamin D Deficiency 2 05/2015  ongoing At Baseline n/a n/a n/a  Arthralgias bilat legs 1 10/01/18  ongoing At Baseline     Bilateral foot pain/ Pes Planus 2   ongoing At Baseline, recently needed new orthotics     Fatigue 1 06/28/18  ongoing   Yes No No  Blurred Vision 1 07/14/18  ongoing  No No No  Hypertension  1-3 2012  ongoing At Baseline. Intermittent grade 1 - 3 recorded since 2012. Changed to History at Baseline.      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 PHT and steroid injections No No No  Nail Infection (fungus on toenails) 1 10/08/19  ongoing  No No No  Vaginal Dryness 1 10/17/19  ongoing  No Yes Yes  Alopecia 1 12/23/19  ongoing  No Yes Yes    Foye Spurling, BSN, RN Clinical Research Nurse 01/09/2020

## 2020-01-09 NOTE — Patient Instructions (Signed)

## 2020-01-10 ENCOUNTER — Telehealth: Payer: Self-pay | Admitting: Oncology

## 2020-01-10 LAB — FOLLICLE STIMULATING HORMONE: FSH: 6.8 m[IU]/mL

## 2020-01-10 LAB — LUTEINIZING HORMONE: LH: <0.3 m[IU]/mL

## 2020-01-10 NOTE — Telephone Encounter (Signed)
No 8/2 los. No changes made to pt's schedule.

## 2020-01-11 ENCOUNTER — Ambulatory Visit (INDEPENDENT_AMBULATORY_CARE_PROVIDER_SITE_OTHER): Payer: 59 | Admitting: Physician Assistant

## 2020-01-11 ENCOUNTER — Other Ambulatory Visit: Payer: Self-pay

## 2020-01-11 ENCOUNTER — Encounter (INDEPENDENT_AMBULATORY_CARE_PROVIDER_SITE_OTHER): Payer: Self-pay | Admitting: Physician Assistant

## 2020-01-11 ENCOUNTER — Encounter: Payer: Self-pay | Admitting: Oncology

## 2020-01-11 VITALS — BP 125/69 | HR 66 | Temp 98.0°F | Ht 65.0 in | Wt 261.0 lb

## 2020-01-11 DIAGNOSIS — E7849 Other hyperlipidemia: Secondary | ICD-10-CM

## 2020-01-11 DIAGNOSIS — Z6841 Body Mass Index (BMI) 40.0 and over, adult: Secondary | ICD-10-CM

## 2020-01-11 DIAGNOSIS — R7303 Prediabetes: Secondary | ICD-10-CM

## 2020-01-11 DIAGNOSIS — Z9189 Other specified personal risk factors, not elsewhere classified: Secondary | ICD-10-CM | POA: Diagnosis not present

## 2020-01-11 MED ORDER — INV-RIBOCICLIB 200 MG TAB NATALEE TRIO003 STUDY: 400.0000 mg | ORAL_TABLET | Freq: Every day | ORAL | 0 refills | Status: DC

## 2020-01-11 NOTE — Progress Notes (Signed)
Chief Complaint:   OBESITY Mackenzie [name] is here to discuss her progress with her obesity treatment plan along with follow-up of her obesity related diagnoses. Mackenzie [name] is on the Category 2 Plan and states she is following her eating plan approximately 70% of the time. Mackenzie [name] states she is exercising 0 minutes 0 times per week.  Today's visit was #: 9 Starting weight: 267 lbs Starting date: 08/09/2019 Today's weight: 261 lbs Today's date: 01/11/2020 Total lbs lost to date: 6 Total lbs lost since last in-office visit: 1  Interim History: Mackenzie [name] states that she is eating all of the food on the plan, but is still hungry after breakfast and before she gets home from work. She is taking her metformin with breakfast and lunch. She is traveling to see her son in [place] next week.  Subjective:   Other hyperlipidemia. Mackenzie [name] is on no medication. No chest pain.  Lab Results  Component Value Date   CHOL 191 08/09/2019   HDL 68 08/09/2019   LDLCALC 115 (H) 08/09/2019   TRIG 42 08/09/2019   Lab Results  Component Value Date   ALT 34 01/09/2020   AST 26 01/09/2020   GGT 22 01/09/2020   ALKPHOS 105 01/09/2020   BILITOT 0.3 01/09/2020   The 10-year ASCVD risk score Mackenzie [name] DC Jr., et al., 2013) is: 3.4%   Values used to calculate the score:     Age: 49 years     Sex: Female     Is Non-Hispanic African American: Yes     Diabetic: Yes     Tobacco smoker: No     Systolic Blood Pressure: 099 mmHg     Is BP treated: No     HDL Cholesterol: 68 mg/dL     Total Cholesterol: 191 mg/dL  Prediabetes. Mackenzie [name] has a diagnosis of prediabetes based on her elevated HgA1c and was informed this puts her at greater risk of developing diabetes. She continues to work on diet and exercise to decrease her risk of diabetes. She denies nausea or hypoglycemia. Recent A1c 5.6, improved from 5.9. Mackenzie [name] is on metformin.  Lab Results  Component Value Date   HGBA1C 5.6 01/09/2020   Lab Results    Component Value Date   INSULIN 12.6 08/09/2019   At risk for heart disease. [name] is at a higher than average risk for cardiovascular disease due to obesity.   Assessment/Plan:   Other hyperlipidemia. Cardiovascular risk and specific lipid/LDL goals reviewed.  We discussed several lifestyle modifications today and [name] will continue to work on diet, exercise and weight loss efforts. Orders and follow up as documented in patient record. Lipid panel will be checked today.  Counseling Intensive lifestyle modifications are the first line treatment for this issue. . Dietary changes: Increase soluble fiber. Decrease simple carbohydrates. . Exercise changes: Moderate to vigorous-intensity aerobic activity 150 minutes per week if tolerated. . Lipid-lowering medications: see documented in medical record.   Prediabetes. Mackenzie [name] will continue to work on weight loss, exercise, and decreasing simple carbohydrates to help decrease the risk of diabetes. Insulin, random will be checked today. She will continue her medication as directed.   At risk for heart disease. Mackenzie [name] was given approximately 15 minutes of coronary artery disease prevention counseling today. She is 49 y.o. female and has risk factors for heart disease including obesity. We discussed intensive lifestyle modifications today with an emphasis on specific weight loss instructions and strategies.   Repetitive spaced learning was employed today to  elicit superior memory formation and behavioral change.  Class 3 severe obesity with serious comorbidity and body mass index (BMI) of 40.0 to 44.9 in adult, unspecified obesity type (Mackenzie [name]).  Mackenzie [name] is currently in the action stage of change. As such, her goal is to continue with weight loss efforts. She has agreed to the Category 2 Plan + 8 oz of Fairlife milk.   Exercise goals: For substantial health benefits, adults should do at least 150 minutes (2 hours and 30 minutes) a week of  moderate-intensity, or 75 minutes (1 hour and 15 minutes) a week of vigorous-intensity aerobic physical activity, or an equivalent combination of moderate- and vigorous-intensity aerobic activity. Aerobic activity should be performed in episodes of at least 10 minutes, and preferably, it should be spread throughout the week.  Behavioral modification strategies: meal planning and cooking strategies and travel eating strategies.  Mackenzie [name] has agreed to follow-up with our clinic in 2 weeks. She was informed of the importance of frequent follow-up visits to maximize her success with intensive lifestyle modifications for her multiple health conditions.   Objective:   Blood pressure 125/69, pulse 66, temperature 98 F (36.7 C), temperature source Oral, height 5\' 5"  (1.651 m), weight 261 lb (118.4 kg), last menstrual period 02/10/2017, SpO2 99 %, unknown if currently breastfeeding. Body mass index is 43.43 kg/m.  General: Cooperative, alert, well developed, in no acute distress. HEENT: Conjunctivae and lids unremarkable. Cardiovascular: Regular rhythm.  Lungs: Normal work of breathing. Neurologic: No focal deficits.   Lab Results  Component Value Date   CREATININE 0.93 01/09/2020   BUN 13 01/09/2020   NA 134 (L) 01/09/2020   K 3.8 01/09/2020   CL 101 01/09/2020   CO2 26 01/09/2020   Lab Results  Component Value Date   ALT 34 01/09/2020   AST 26 01/09/2020   GGT 22 01/09/2020   ALKPHOS 105 01/09/2020   BILITOT 0.3 01/09/2020   Lab Results  Component Value Date   HGBA1C 5.6 01/09/2020   HGBA1C 5.9 (H) 08/09/2019   HGBA1C 5.9 (H) 07/26/2018   Lab Results  Component Value Date   INSULIN 12.6 08/09/2019   Lab Results  Component Value Date   TSH 0.657 08/09/2019   Lab Results  Component Value Date   CHOL 191 08/09/2019   HDL 68 08/09/2019   LDLCALC 115 (H) 08/09/2019   TRIG 42 08/09/2019   Lab Results  Component Value Date   WBC 3.7 (L) 01/09/2020   HGB 12.0 01/09/2020     HCT 36.3 01/09/2020   MCV 89.0 01/09/2020   PLT 206 01/09/2020   No results found for: IRON, TIBC, FERRITIN  Attestation Statements:   Reviewed by clinician on day of visit: allergies, medications, problem list, medical history, surgical history, family history, social history, and previous encounter notes.  IMichaelene Song, am acting as transcriptionist for Abby Potash, PA-C   I have reviewed the above documentation for accuracy and completeness, and I agree with the above. Abby Potash, PA-C

## 2020-01-12 ENCOUNTER — Other Ambulatory Visit: Payer: Self-pay | Admitting: Oncology

## 2020-01-12 ENCOUNTER — Telehealth: Payer: Self-pay | Admitting: *Deleted

## 2020-01-12 ENCOUNTER — Ambulatory Visit (INDEPENDENT_AMBULATORY_CARE_PROVIDER_SITE_OTHER): Payer: 59 | Admitting: Physician Assistant

## 2020-01-12 LAB — LIPID PANEL
Chol/HDL Ratio: 3.8 ratio (ref 0.0–4.4)
Cholesterol, Total: 210 mg/dL — ABNORMAL HIGH (ref 100–199)
HDL: 56 mg/dL (ref >39)
LDL Chol Calc (NIH): 143 mg/dL — ABNORMAL HIGH (ref 0–99)
Triglycerides: 64 mg/dL (ref 0–149)
VLDL Cholesterol Cal: 11 mg/dL (ref 5–40)

## 2020-01-12 LAB — INSULIN, RANDOM: INSULIN: 13.3 u[IU]/mL (ref 2.6–24.9)

## 2020-01-12 NOTE — Telephone Encounter (Signed)
[name]- labs for menopausal status;  Informed patient that I found out the Estradiol level that was ordered can take 2 weeks to get results.  Let patient know that research nurse will follow up on results in 2 weeks after I return from vacation on 01/30/20.  The results will be available before next injection appointment for goserelin scheduled for 02/06/20. If patient is determined to be post menopausal then the injection appointments can be canceled. Patient verbalized understanding.  Foye Spurling, BSN, RN Clinical Research Nurse 01/12/2020 2:39 PM

## 2020-01-18 LAB — ESTRADIOL, ULTRA SENS: Estradiol, Sensitive: <2.5 pg/mL

## 2020-01-26 ENCOUNTER — Ambulatory Visit (INDEPENDENT_AMBULATORY_CARE_PROVIDER_SITE_OTHER): Payer: 59 | Admitting: Physician Assistant

## 2020-01-26 ENCOUNTER — Encounter (INDEPENDENT_AMBULATORY_CARE_PROVIDER_SITE_OTHER): Payer: Self-pay | Admitting: Physician Assistant

## 2020-01-26 ENCOUNTER — Other Ambulatory Visit: Payer: Self-pay

## 2020-01-26 VITALS — BP 112/75 | HR 91 | Temp 98.1°F | Ht 65.0 in | Wt 261.0 lb

## 2020-01-26 DIAGNOSIS — Z6841 Body Mass Index (BMI) 40.0 and over, adult: Secondary | ICD-10-CM

## 2020-01-26 DIAGNOSIS — R7303 Prediabetes: Secondary | ICD-10-CM

## 2020-01-26 NOTE — Progress Notes (Signed)
Chief Complaint:   OBESITY Mackenzie [name] is here to discuss her progress with her obesity treatment plan along with follow-up of her obesity related diagnoses. Mackenzie [name] is on the Category 2 Plan + 8 oz of Fairlife milk and states she is following her eating plan approximately 70% of the time. Mackenzie [name] states she is doing gym circuit 45 minutes 1 time per week and walking 45 minutes 2-3 times per week.  Today's visit was #: 10 Starting weight: 267 lbs Starting date: 08/09/2019 Today's weight: 261 lbs Today's date: 01/26/2020 Total lbs lost to date: 6 Total lbs lost since last in-office visit: 0  Interim History: Mackenzie [name] reports that she just returned from [place] visiting her family. She continues to be hungry between breakfast and lunch even when eating all of her food. GLP-1 was discussed today.  Subjective:   Prediabetes. Mackenzie [name] has a diagnosis of prediabetes based on her elevated HgA1c and was informed this puts her at greater risk of developing diabetes. She continues to work on diet and exercise to decrease her risk of diabetes. She denies nausea or hypoglycemia. Mackenzie [name] is on metformin and reports polyphagia.  Lab Results  Component Value Date   HGBA1C 5.6 01/09/2020   Lab Results  Component Value Date   INSULIN 13.3 01/11/2020   INSULIN 12.6 08/09/2019   Assessment/Plan:   Prediabetes. Mackenzie [name] will continue to work on weight loss, exercise, and decreasing simple carbohydrates to help decrease the risk of diabetes. She will continue her medication as directed.   Class 3 severe obesity with serious comorbidity and body mass index (BMI) of 40.0 to 44.9 in adult, unspecified obesity type (Mackenzie [name]).  Mackenzie [name] is currently in the action stage of change. As such, her goal is to continue with weight loss efforts. She has agreed to the Category 2 Plan + 200 protein calories. She will add [place] bacon to breakfast.  IC will be performed at her next office visit.  Exercise  goals: For substantial health benefits, adults should do at least 150 minutes (2 hours and 30 minutes) a week of moderate-intensity, or 75 minutes (1 hour and 15 minutes) a week of vigorous-intensity aerobic physical activity, or an equivalent combination of moderate- and vigorous-intensity aerobic activity. Aerobic activity should be performed in episodes of at least 10 minutes, and preferably, it should be spread throughout the week.  Behavioral modification strategies: meal planning and cooking strategies and keeping healthy foods in the home.  Mackenzie [name] has agreed to follow-up with our clinic in 2 weeks. She was informed of the importance of frequent follow-up visits to maximize her success with intensive lifestyle modifications for her multiple health conditions.   Objective:   Blood pressure 112/75, pulse 91, temperature 98.1 F (36.7 C), temperature source Oral, height 5\' 5"  (1.651 m), weight 261 lb (118.4 kg), last menstrual period 02/10/2017, SpO2 98 %, unknown if currently breastfeeding. Body mass index is 43.43 kg/m.  General: Cooperative, alert, well developed, in no acute distress. HEENT: Conjunctivae and lids unremarkable. Cardiovascular: Regular rhythm.  Lungs: Normal work of breathing. Neurologic: No focal deficits.   Lab Results  Component Value Date   CREATININE 0.93 01/09/2020   BUN 13 01/09/2020   NA 134 (L) 01/09/2020   K 3.8 01/09/2020   CL 101 01/09/2020   CO2 26 01/09/2020   Lab Results  Component Value Date   ALT 34 01/09/2020   AST 26 01/09/2020   GGT 22 01/09/2020   ALKPHOS 105 01/09/2020   BILITOT  0.3 01/09/2020   Lab Results  Component Value Date   HGBA1C 5.6 01/09/2020   HGBA1C 5.9 (H) 08/09/2019   HGBA1C 5.9 (H) 07/26/2018   Lab Results  Component Value Date   INSULIN 13.3 01/11/2020   INSULIN 12.6 08/09/2019   Lab Results  Component Value Date   TSH 0.657 08/09/2019   Lab Results  Component Value Date   CHOL 210 (H) 01/11/2020   HDL  56 01/11/2020   LDLCALC 143 (H) 01/11/2020   TRIG 64 01/11/2020   CHOLHDL 3.8 01/11/2020   Lab Results  Component Value Date   WBC 3.7 (L) 01/09/2020   HGB 12.0 01/09/2020   HCT 36.3 01/09/2020   MCV 89.0 01/09/2020   PLT 206 01/09/2020   No results found for: IRON, TIBC, FERRITIN  Attestation Statements:   Reviewed by clinician on day of visit: allergies, medications, problem list, medical history, surgical history, family history, social history, and previous encounter notes.  Time spent on visit including pre-visit chart review and post-visit charting and care was 30 minutes.   IMichaelene Song, am acting as transcriptionist for Abby Potash, PA-C   I have reviewed the above documentation for accuracy and completeness, and I agree with the above. Abby Potash, PA-C

## 2020-02-01 ENCOUNTER — Telehealth: Payer: Self-pay | Admitting: *Deleted

## 2020-02-01 NOTE — Telephone Encounter (Signed)
Per Dr. Jana Hakim hormone levels show patient is appropriately functionally post menopause due to suppression by the goserelin.  To stop the goserelin she can either have her ovaries removed or change to tamoxifen and go off study.  LVM for patient asking her to call research nurse to discuss results.  Foye Spurling, BSN, RN Clinical Research Nurse 02/01/2020 4:47 PM

## 2020-02-06 ENCOUNTER — Inpatient Hospital Stay: Payer: 59

## 2020-02-06 ENCOUNTER — Encounter: Payer: Self-pay | Admitting: *Deleted

## 2020-02-06 ENCOUNTER — Other Ambulatory Visit: Payer: Self-pay

## 2020-02-06 VITALS — BP 146/78 | HR 73 | Temp 98.0°F | Resp 18

## 2020-02-06 DIAGNOSIS — Z006 Encounter for examination for normal comparison and control in clinical research program: Secondary | ICD-10-CM | POA: Diagnosis not present

## 2020-02-06 DIAGNOSIS — Z803 Family history of malignant neoplasm of breast: Secondary | ICD-10-CM | POA: Diagnosis not present

## 2020-02-06 DIAGNOSIS — Z170 Estrogen receptor positive status [ER+]: Secondary | ICD-10-CM

## 2020-02-06 DIAGNOSIS — Z9012 Acquired absence of left breast and nipple: Secondary | ICD-10-CM | POA: Diagnosis not present

## 2020-02-06 DIAGNOSIS — Z79811 Long term (current) use of aromatase inhibitors: Secondary | ICD-10-CM | POA: Diagnosis not present

## 2020-02-06 DIAGNOSIS — Z923 Personal history of irradiation: Secondary | ICD-10-CM | POA: Diagnosis not present

## 2020-02-06 DIAGNOSIS — C50812 Malignant neoplasm of overlapping sites of left female breast: Secondary | ICD-10-CM | POA: Diagnosis not present

## 2020-02-06 DIAGNOSIS — Z9221 Personal history of antineoplastic chemotherapy: Secondary | ICD-10-CM | POA: Diagnosis not present

## 2020-02-06 DIAGNOSIS — Z5111 Encounter for antineoplastic chemotherapy: Secondary | ICD-10-CM | POA: Diagnosis not present

## 2020-02-06 MED ORDER — GOSERELIN ACETATE 3.6 MG ~~LOC~~ IMPL
DRUG_IMPLANT | SUBCUTANEOUS | Status: AC
  Filled 2020-02-06: qty 3.6

## 2020-02-06 MED ORDER — GOSERELIN ACETATE 3.6 MG ~~LOC~~ IMPL
3.6000 mg | DRUG_IMPLANT | Freq: Once | SUBCUTANEOUS | Status: AC
  Administered 2020-02-06: 3.6 mg via SUBCUTANEOUS

## 2020-02-06 NOTE — Patient Instructions (Signed)

## 2020-02-06 NOTE — Progress Notes (Signed)
Met with patient prior to her injection appointment this morning.  Patient states she received this nurse's VM last week regarding hormone lab results and need to continue Goserelin injections. Informed patient that in order to stop the Goserelin she can either have her ovaries removed or change to Tamoxifen and go off study. Patient states she is not interested in going off study.  She will think about possibly having her ovaries removed at some point. Meanwhile, patient agrees to continue with the daily Letrozole and monthly Goserelin injections per protocol. Thanked patient for her time and encouraged her to call if any questions or concerns prior to next visit. She verbalized understanding.  Foye Spurling, BSN, RN Clinical Research Nurse 02/06/2020 9:04 AM

## 2020-02-08 ENCOUNTER — Other Ambulatory Visit: Payer: Self-pay

## 2020-02-08 ENCOUNTER — Ambulatory Visit (INDEPENDENT_AMBULATORY_CARE_PROVIDER_SITE_OTHER): Payer: 59 | Admitting: Physician Assistant

## 2020-02-08 VITALS — BP 131/82 | HR 70 | Temp 98.7°F | Ht 65.0 in | Wt 263.0 lb

## 2020-02-08 DIAGNOSIS — Z6841 Body Mass Index (BMI) 40.0 and over, adult: Secondary | ICD-10-CM

## 2020-02-08 DIAGNOSIS — Z9189 Other specified personal risk factors, not elsewhere classified: Secondary | ICD-10-CM | POA: Diagnosis not present

## 2020-02-08 DIAGNOSIS — R7303 Prediabetes: Secondary | ICD-10-CM

## 2020-02-08 DIAGNOSIS — R0602 Shortness of breath: Secondary | ICD-10-CM | POA: Diagnosis not present

## 2020-02-09 MED ORDER — METFORMIN HCL 500 MG PO TABS: 500.0000 mg | ORAL_TABLET | Freq: Two times a day (BID) | ORAL | 0 refills | Status: DC

## 2020-02-09 MED FILL — METFORMIN HCL 500 MG TABS: 500 | 30 days supply | Qty: 60 | Fill #0

## 2020-02-09 NOTE — Progress Notes (Signed)
Chief Complaint:   OBESITY Mackenzie [name] is here to discuss her progress with her obesity treatment plan along with follow-up of her obesity related diagnoses. Mackenzie [name] is on the Category 2 Plan + protein and states she is following her eating plan approximately 70% of the time. [name] states she is doing water aerobics 40 minutes 2 times per week.  Today's visit was #: 11 Starting weight: 267 lbs Starting date: 08/09/2019 Today's weight: 263 lbs Today's date: 02/08/2020 Total lbs lost to date: 4 Total lbs lost since last in-office visit: 0  Interim History: Mackenzie [name] states she had several gatherings at work and she has been snacking more often. According to her IC today, RMR has increased.  Subjective:   Shortness of breath on exertion. Mackenzie [name] notes increasing shortness of breath with exercising and seems to be worsening over time with weight gain. She notes getting out of breath sooner with activity than she used to. Mackenzie [name] denies shortness of breath at rest or orthopnea. No dizziness or lightheadedness.  Prediabetes. Mackenzie [name] has a diagnosis of prediabetes based on her elevated HgA1c and was informed this puts her at greater risk of developing diabetes. She continues to work on diet and exercise to decrease her risk of diabetes. She denies nausea or hypoglycemia. Mackenzie [name] is on metformin, which she is tolerating well, and denies polyphagia. Appetite is controlled.  Lab Results  Component Value Date   HGBA1C 5.6 01/09/2020   Lab Results  Component Value Date   INSULIN 13.3 01/11/2020   INSULIN 12.6 08/09/2019   At risk for diabetes mellitus. Mackenzie [name] is at higher than average risk for developing diabetes due to her obesity.   Assessment/Plan:   Shortness of breath on exertion. [name]'s shortness of breath appears to be obesity related and exercise induced. She has agreed to work on weight loss and gradually increase exercise to treat her exercise induced shortness of  breath. Will continue to monitor closely. IC was done showing an RMR of 1998.  Prediabetes. [name] will continue to work on weight loss, exercise, and decreasing simple carbohydrates to help decrease the risk of diabetes. Refill was given for metFORMIN (GLUCOPHAGE) 500 MG tablet #60 with 0 refills.  At risk for diabetes mellitus. [name] was given approximately 15 minutes of diabetes education and counseling today. We discussed intensive lifestyle modifications today with an emphasis on weight loss as well as increasing exercise and decreasing simple carbohydrates in her diet. We also reviewed medication options with an emphasis on risk versus benefit of those discussed.   Repetitive spaced learning was employed today to elicit superior memory formation and behavioral change.  Class 3 severe obesity with serious comorbidity and body mass index (BMI) of 40.0 to 44.9 in adult, unspecified obesity type ([place]).  Mackenzie [name] is currently in the action stage of change. As such, her goal is to continue with weight loss efforts. She has agreed to change plans and will now follow the Category 3 Plan. We discussed GLP-1 today but will wait until patient makes adjustment to new category, as it is more food.   Exercise goals: For substantial health benefits, adults should do at least 150 minutes (2 hours and 30 minutes) a week of moderate-intensity, or 75 minutes (1 hour and 15 minutes) a week of vigorous-intensity aerobic physical activity, or an equivalent combination of moderate- and vigorous-intensity aerobic activity. Aerobic activity should be performed in episodes of at least 10 minutes, and preferably, it should be spread throughout the week.  Behavioral modification strategies: increasing lean protein intake and planning for success.  Mackenzie [name] has agreed to follow-up with our clinic in 2 weeks. She was informed of the importance of frequent follow-up visits to maximize her success with intensive lifestyle  modifications for her multiple health conditions.   Objective:   Blood pressure 131/82, pulse 70, temperature 98.7 F (37.1 C), temperature source Oral, height 5\' 5"  (1.651 m), weight 263 lb (119.3 kg), last menstrual period 02/10/2017, SpO2 98 %, unknown if currently breastfeeding. Body mass index is 43.77 kg/m.  General: Cooperative, alert, well developed, in no acute distress. HEENT: Conjunctivae and lids unremarkable. Cardiovascular: Regular rhythm.  Lungs: Normal work of breathing. Neurologic: No focal deficits.   Lab Results  Component Value Date   CREATININE 0.93 01/09/2020   BUN 13 01/09/2020   NA 134 (L) 01/09/2020   K 3.8 01/09/2020   CL 101 01/09/2020   CO2 26 01/09/2020   Lab Results  Component Value Date   ALT 34 01/09/2020   AST 26 01/09/2020   GGT 22 01/09/2020   ALKPHOS 105 01/09/2020   BILITOT 0.3 01/09/2020   Lab Results  Component Value Date   HGBA1C 5.6 01/09/2020   HGBA1C 5.9 (H) 08/09/2019   HGBA1C 5.9 (H) 07/26/2018   Lab Results  Component Value Date   INSULIN 13.3 01/11/2020   INSULIN 12.6 08/09/2019   Lab Results  Component Value Date   TSH 0.657 08/09/2019   Lab Results  Component Value Date   CHOL 210 (H) 01/11/2020   HDL 56 01/11/2020   LDLCALC 143 (H) 01/11/2020   TRIG 64 01/11/2020   CHOLHDL 3.8 01/11/2020   Lab Results  Component Value Date   WBC 3.7 (L) 01/09/2020   HGB 12.0 01/09/2020   HCT 36.3 01/09/2020   MCV 89.0 01/09/2020   PLT 206 01/09/2020   No results found for: IRON, TIBC, FERRITIN  Attestation Statements:   Reviewed by clinician on day of visit: allergies, medications, problem list, medical history, surgical history, family history, social history, and previous encounter notes.  IMichaelene Song, am acting as transcriptionist for Abby Potash, PA-C   I have reviewed the above documentation for accuracy and completeness, and I agree with the above. Abby Potash, PA-C

## 2020-02-14 ENCOUNTER — Other Ambulatory Visit: Payer: Self-pay | Admitting: Oncology

## 2020-02-14 DIAGNOSIS — C50812 Malignant neoplasm of overlapping sites of left female breast: Secondary | ICD-10-CM

## 2020-02-14 DIAGNOSIS — Z170 Estrogen receptor positive status [ER+]: Secondary | ICD-10-CM

## 2020-02-14 MED FILL — LETROZOLE 2.5 MG TABS: 2.5 | 90 days supply | Qty: 90 | Fill #0

## 2020-02-14 NOTE — Telephone Encounter (Signed)
Patient left VM requesting refill on Letrozole. Rx refilled and patient notified.  Foye Spurling, BSN, RN Clinical Research Nurse 02/14/2020 9:40 AM

## 2020-02-23 ENCOUNTER — Encounter (INDEPENDENT_AMBULATORY_CARE_PROVIDER_SITE_OTHER): Payer: Self-pay | Admitting: Physician Assistant

## 2020-02-23 ENCOUNTER — Other Ambulatory Visit: Payer: Self-pay

## 2020-02-23 ENCOUNTER — Ambulatory Visit (INDEPENDENT_AMBULATORY_CARE_PROVIDER_SITE_OTHER): Payer: 59 | Admitting: Physician Assistant

## 2020-02-23 VITALS — BP 120/75 | HR 85 | Temp 98.1°F | Ht 65.0 in | Wt 262.0 lb

## 2020-02-23 DIAGNOSIS — E7849 Other hyperlipidemia: Secondary | ICD-10-CM

## 2020-02-23 DIAGNOSIS — Z6841 Body Mass Index (BMI) 40.0 and over, adult: Secondary | ICD-10-CM | POA: Diagnosis not present

## 2020-02-23 DIAGNOSIS — E559 Vitamin D deficiency, unspecified: Secondary | ICD-10-CM | POA: Diagnosis not present

## 2020-02-23 DIAGNOSIS — Z9189 Other specified personal risk factors, not elsewhere classified: Secondary | ICD-10-CM | POA: Diagnosis not present

## 2020-02-23 MED ORDER — VITAMIN D 50 MCG (2000 UT) PO CAPS: 2000.0000 [IU] | ORAL_CAPSULE | Freq: Every day | ORAL | 0 refills | Status: DC

## 2020-02-26 NOTE — Progress Notes (Signed)
Chief Complaint:   OBESITY Mackenzie [name] is here to discuss her progress with her obesity treatment plan along with follow-up of her obesity related diagnoses. Mackenzie [name] is on the Category 3 Plan and states she is following her eating plan approximately 60% of the time. Mackenzie [name] states she is doing water aerobics with a personal trainer for 30 minutes 1 time per week.  Today's visit was #: 12 Starting weight: 267 lbs Starting date: 08/09/2019 Today's weight: 262 lbs Today's date: 02/23/2020 Total lbs lost to date: 5 Total lbs lost since last in-office visit: 1  Interim History: Mackenzie [name] reports that changing to Category 3 has been a  challenge. She has a busier schedule with her kid's school starting and she has not been cooking as much. She eats her dinner portion for her lunch.  Subjective:   1. Other hyperlipidemia Mackenzie [name] is on fish oil, and she is not on prescribed medications. Last lipid panel was not at goal. She is exercising regularly.  2. Vitamin D deficiency Mackenzie [name] is on calcium plus Vit D supplementation. Last Vit D level was not at goal.  3. At risk for heart disease Mackenzie [name] is at a higher than average risk for cardiovascular disease due to obesity.   Assessment/Plan:   1. Other hyperlipidemia Cardiovascular risk and specific lipid/LDL goals reviewed. We discussed several lifestyle modifications today. Mackenzie [name] will continue to work on diet, exercise and weight loss efforts. Orders and follow up as documented in patient record.   Counseling Intensive lifestyle modifications are the first line treatment for this issue.  Dietary changes: Increase soluble fiber. Decrease simple carbohydrates.  Exercise changes: Moderate to vigorous-intensity aerobic activity 150 minutes per week if tolerated.  Lipid-lowering medications: see documented in medical record.  2. Vitamin D deficiency Low Vitamin D level contributes to fatigue and are associated with obesity, breast, and colon  cancer. Mackenzie [name] agreed to continue taking calcium plus D and add Vitamin D 2,000 IU daily with no refills. She will follow-up for routine testing of Vitamin D, at least 2-3 times per year to avoid over-replacement.  - Cholecalciferol (VITAMIN D) 50 MCG (2000 UT) CAPS; Take 1 capsule (2,000 Units total) by mouth daily.  Dispense: 30 capsule; Refill: 0  3. At risk for heart disease Mackenzie [name] was given approximately 15 minutes of coronary artery disease prevention counseling today. She is 49 y.o. female and has risk factors for heart disease including obesity. We discussed intensive lifestyle modifications today with an emphasis on specific weight loss instructions and strategies.   Repetitive spaced learning was employed today to elicit superior memory formation and behavioral change.  4. Class 3 severe obesity with serious comorbidity and body mass index (BMI) of 40.0 to 44.9 in adult, unspecified obesity type (HCC) Mackenzie [name] is currently in the action stage of change. As such, her goal is to continue with weight loss efforts. She has agreed to the Category 3 Plan and keeping a food journal and adhering to recommended goals of 450-600 calories and 40 grams of protein at supper daily.   Exercise goals: As is.  Behavioral modification strategies: meal planning and cooking strategies and keeping healthy foods in the home.  Mackenzie [name] has agreed to follow-up with our clinic in 2 weeks. She was informed of the importance of frequent follow-up visits to maximize her success with intensive lifestyle modifications for her multiple health conditions.   Objective:   Blood pressure 120/75, pulse 85, temperature 98.1 F (36.7 C), height 5\' 5"  (1.651 m), weight  262 lb (118.8 kg), last menstrual period 02/10/2017, SpO2 100 %, unknown if currently breastfeeding. Body mass index is 43.6 kg/m.  General: Cooperative, alert, well developed, in no acute distress. HEENT: Conjunctivae and lids  unremarkable. Cardiovascular: Regular rhythm.  Lungs: Normal work of breathing. Neurologic: No focal deficits.   Lab Results  Component Value Date   CREATININE 0.93 01/09/2020   BUN 13 01/09/2020   NA 134 (L) 01/09/2020   K 3.8 01/09/2020   CL 101 01/09/2020   CO2 26 01/09/2020   Lab Results  Component Value Date   ALT 34 01/09/2020   AST 26 01/09/2020   GGT 22 01/09/2020   ALKPHOS 105 01/09/2020   BILITOT 0.3 01/09/2020   Lab Results  Component Value Date   HGBA1C 5.6 01/09/2020   HGBA1C 5.9 (H) 08/09/2019   HGBA1C 5.9 (H) 07/26/2018   Lab Results  Component Value Date   INSULIN 13.3 01/11/2020   INSULIN 12.6 08/09/2019   Lab Results  Component Value Date   TSH 0.657 08/09/2019   Lab Results  Component Value Date   CHOL 210 (H) 01/11/2020   HDL 56 01/11/2020   LDLCALC 143 (H) 01/11/2020   TRIG 64 01/11/2020   CHOLHDL 3.8 01/11/2020   Lab Results  Component Value Date   WBC 3.7 (L) 01/09/2020   HGB 12.0 01/09/2020   HCT 36.3 01/09/2020   MCV 89.0 01/09/2020   PLT 206 01/09/2020   No results found for: IRON, TIBC, FERRITIN  Attestation Statements:   Reviewed by clinician on day of visit: allergies, medications, problem list, medical history, surgical history, family history, social history, and previous encounter notes.   Wilhemena Durie, am acting as transcriptionist for [employer], PA-C.  I have reviewed the above documentation for accuracy and completeness, and I agree with the above. Abby Potash, PA-C

## 2020-03-02 DIAGNOSIS — E785 Hyperlipidemia, unspecified: Secondary | ICD-10-CM | POA: Diagnosis not present

## 2020-03-02 DIAGNOSIS — Z0001 Encounter for general adult medical examination with abnormal findings: Secondary | ICD-10-CM | POA: Diagnosis not present

## 2020-03-02 DIAGNOSIS — D649 Anemia, unspecified: Secondary | ICD-10-CM | POA: Diagnosis not present

## 2020-03-02 MED FILL — SYMBICORT 80-4.5 MCG INH: 80-4.5 | 30 days supply | Qty: 10 | Fill #0

## 2020-03-02 MED FILL — TRULICITY 0.75 MG/0.5 ML PE: 0.75 | 28 days supply | Qty: 2 | Fill #0

## 2020-03-05 ENCOUNTER — Other Ambulatory Visit: Payer: Self-pay

## 2020-03-05 ENCOUNTER — Inpatient Hospital Stay: Payer: 59 | Attending: Adult Health

## 2020-03-05 VITALS — BP 140/82 | HR 83 | Temp 98.5°F | Resp 18

## 2020-03-05 DIAGNOSIS — Z5111 Encounter for antineoplastic chemotherapy: Secondary | ICD-10-CM | POA: Diagnosis not present

## 2020-03-05 DIAGNOSIS — Z170 Estrogen receptor positive status [ER+]: Secondary | ICD-10-CM

## 2020-03-05 DIAGNOSIS — Z006 Encounter for examination for normal comparison and control in clinical research program: Secondary | ICD-10-CM | POA: Diagnosis not present

## 2020-03-05 DIAGNOSIS — C50812 Malignant neoplasm of overlapping sites of left female breast: Secondary | ICD-10-CM | POA: Insufficient documentation

## 2020-03-05 MED ORDER — GOSERELIN ACETATE 3.6 MG ~~LOC~~ IMPL
DRUG_IMPLANT | SUBCUTANEOUS | Status: AC
  Filled 2020-03-05: qty 3.6

## 2020-03-05 MED ORDER — GOSERELIN ACETATE 3.6 MG ~~LOC~~ IMPL
3.6000 mg | DRUG_IMPLANT | Freq: Once | SUBCUTANEOUS | Status: AC
  Administered 2020-03-05: 3.6 mg via SUBCUTANEOUS

## 2020-03-05 NOTE — Patient Instructions (Signed)

## 2020-03-07 ENCOUNTER — Ambulatory Visit (INDEPENDENT_AMBULATORY_CARE_PROVIDER_SITE_OTHER): Payer: 59 | Admitting: Physician Assistant

## 2020-03-07 ENCOUNTER — Other Ambulatory Visit: Payer: Self-pay

## 2020-03-07 ENCOUNTER — Encounter (INDEPENDENT_AMBULATORY_CARE_PROVIDER_SITE_OTHER): Payer: Self-pay | Admitting: Physician Assistant

## 2020-03-07 VITALS — BP 128/80 | HR 84 | Temp 98.0°F | Ht 65.0 in | Wt 262.0 lb

## 2020-03-07 DIAGNOSIS — I10 Essential (primary) hypertension: Secondary | ICD-10-CM | POA: Diagnosis not present

## 2020-03-07 DIAGNOSIS — R7303 Prediabetes: Secondary | ICD-10-CM

## 2020-03-07 DIAGNOSIS — Z6841 Body Mass Index (BMI) 40.0 and over, adult: Secondary | ICD-10-CM | POA: Diagnosis not present

## 2020-03-07 NOTE — Progress Notes (Signed)
Chief Complaint:   OBESITY Mackenzie [name] is here to discuss her progress with her obesity treatment plan along with follow-up of her obesity related diagnoses. Mackenzie [name] is on the Category 3 Plan and keeping a food journal and adhering to recommended goals of 450-600 calories and 40 grams of protein at supper daily and states she is following her eating plan approximately 80% of the time. Mackenzie [name] states she is walking and doing cardio for 30-45 minutes 4 times per week.  Today's visit was #: 50 Starting weight: 267 lbs Starting date: 08/09/2019 Today's weight: 262 lbs Today's date: 03/07/2020 Total lbs lost to date: 5 Total lbs lost since last in-office visit: 0  Interim History: Mackenzie [name] reports that she ate out less often for dinner. She has a harder time on the weekends getting all of the food in because she wakes up late and sometimes ends up skipping a meal. She states that her primary care physician started her on Trulicity.  Subjective:   1. Pre-diabetes Mackenzie [name]'s last A1c was 5.6. She is on metformin and she states that her primary care physician just started her on Trulicity. She is still hungry after breakfast.  2. Essential hypertension Mackenzie [name]'s blood pressure is controlled, and she is not on medications.  Assessment/Plan:   1. Pre-diabetes Mackenzie [name] will continue her medications, and will continue to work on weight loss, exercise, and decreasing simple carbohydrates to help decrease the risk of diabetes.   2. Essential hypertension Mackenzie [name] is working on healthy weight loss and exercise to improve blood pressure control. We will watch for signs of hypotension as she continues her lifestyle modifications.  3. Class 3 severe obesity with serious comorbidity and body mass index (BMI) of 40.0 to 44.9 in adult, unspecified obesity type (HCC) Mackenzie [name] is currently in the action stage of change. As such, her goal is to continue with weight loss efforts. She has agreed to the Category 3  Plan and keeping a food journal and adhering to recommended goals of 450-600 calories and 40 grams of protein at supper daily.   Exercise goals: As is.  Behavioral modification strategies: no skipping meals and meal planning and cooking strategies.  Mackenzie [name] has agreed to follow-up with our clinic in 2 weeks. She was informed of the importance of frequent follow-up visits to maximize her success with intensive lifestyle modifications for her multiple health conditions.   Objective:   Blood pressure 128/80, pulse 84, temperature 98 F (36.7 C), height 5\' 5"  (1.651 m), weight 262 lb (118.8 kg), last menstrual period 02/10/2017, SpO2 98 %, unknown if currently breastfeeding. Body mass index is 43.6 kg/m.  General: Cooperative, alert, well developed, in no acute distress. HEENT: Conjunctivae and lids unremarkable. Cardiovascular: Regular rhythm.  Lungs: Normal work of breathing. Neurologic: No focal deficits.   Lab Results  Component Value Date   CREATININE 0.93 01/09/2020   BUN 13 01/09/2020   NA 134 (L) 01/09/2020   K 3.8 01/09/2020   CL 101 01/09/2020   CO2 26 01/09/2020   Lab Results  Component Value Date   ALT 34 01/09/2020   AST 26 01/09/2020   GGT 22 01/09/2020   ALKPHOS 105 01/09/2020   BILITOT 0.3 01/09/2020   Lab Results  Component Value Date   HGBA1C 5.6 01/09/2020   HGBA1C 5.9 (H) 08/09/2019   HGBA1C 5.9 (H) 07/26/2018   Lab Results  Component Value Date   INSULIN 13.3 01/11/2020   INSULIN 12.6 08/09/2019   Lab Results  Component Value  Date   TSH 0.657 08/09/2019   Lab Results  Component Value Date   CHOL 210 (H) 01/11/2020   HDL 56 01/11/2020   LDLCALC 143 (H) 01/11/2020   TRIG 64 01/11/2020   CHOLHDL 3.8 01/11/2020   Lab Results  Component Value Date   WBC 3.7 (L) 01/09/2020   HGB 12.0 01/09/2020   HCT 36.3 01/09/2020   MCV 89.0 01/09/2020   PLT 206 01/09/2020   No results found for: IRON, TIBC, FERRITIN  Attestation Statements:    Reviewed by clinician on day of visit: allergies, medications, problem list, medical history, surgical history, family history, social history, and previous encounter notes.  Time spent on visit including pre-visit chart review and post-visit care and charting was 30 minutes.    Wilhemena Durie, am acting as transcriptionist for [employer], PA-C.  I have reviewed the above documentation for accuracy and completeness, and I agree with the above. Abby Potash, PA-C

## 2020-03-21 ENCOUNTER — Encounter (INDEPENDENT_AMBULATORY_CARE_PROVIDER_SITE_OTHER): Payer: Self-pay | Admitting: Family Medicine

## 2020-03-21 ENCOUNTER — Ambulatory Visit (INDEPENDENT_AMBULATORY_CARE_PROVIDER_SITE_OTHER): Payer: 59 | Admitting: Family Medicine

## 2020-03-21 ENCOUNTER — Other Ambulatory Visit (INDEPENDENT_AMBULATORY_CARE_PROVIDER_SITE_OTHER): Payer: Self-pay | Admitting: Family Medicine

## 2020-03-21 ENCOUNTER — Other Ambulatory Visit: Payer: Self-pay

## 2020-03-21 VITALS — BP 134/79 | HR 69 | Temp 98.5°F | Ht 65.0 in | Wt 260.0 lb

## 2020-03-21 DIAGNOSIS — R7303 Prediabetes: Secondary | ICD-10-CM | POA: Diagnosis not present

## 2020-03-21 DIAGNOSIS — Z6841 Body Mass Index (BMI) 40.0 and over, adult: Secondary | ICD-10-CM

## 2020-03-21 MED ORDER — METFORMIN HCL 500 MG PO TABS: 500.0000 mg | ORAL_TABLET | Freq: Two times a day (BID) | ORAL | 0 refills | Status: DC

## 2020-03-21 MED FILL — METFORMIN HCL 500 MG TABS: 500 | 30 days supply | Qty: 60 | Fill #0

## 2020-03-22 NOTE — Progress Notes (Signed)
Chief Complaint:   OBESITY Gwenda is here to discuss her progress with her obesity treatment plan along with follow-up of her obesity related diagnoses. Winifred is on the Category 3 Plan plus 400-600 calories and 40 grams of protein at dinner and states she is following her eating plan approximately 85% of the time. Nickia states she is walking/doing PT for 20-30 minutes 2-3 times per week.  Today's visit was #: 14 Starting weight: 267 lbs Starting date: 08/09/2019 Today's weight: 260 lbs Today's date: 03/21/2020 Total lbs lost to date: 7 lbs Total lbs lost since last in-office visit: 2 lbs  Interim History: this is Dawnetta's first office visit with me.  She was previously seen by Abby Potash, PA-C.  She says she is doing better with eating at home more and meal planning.  For activity, she is walking 1.5 miles 2-3 days per week.  She has a Physiological scientist once day per week for strength training.    Assessment/Plan:   1. Prediabetes Goal is HgbA1c < 5.7 and insulin level closer to 5.  Denna is taking metformin 500 mg twice daily.  No side effects.  No issues or concerns.    Plan:  Continue metformin.  Refill will be sent in today, as per below.  Continue prudent nutritional plan and weight loss.  Lab Results  Component Value Date   HGBA1C 5.6 01/09/2020   Lab Results  Component Value Date   INSULIN 13.3 01/11/2020   INSULIN 12.6 08/09/2019   -Refill metFORMIN (GLUCOPHAGE) 500 MG tablet; Take 1 tablet (500 mg total) by mouth 2 (two) times daily with a meal.  Dispense: 60 tablet; Refill: 0  2. Class 3 severe obesity with serious comorbidity and body mass index (BMI) of 40.0 to 44.9 in adult, unspecified obesity type (HCC)  Preet is currently in the action stage of change. As such, her goal is to continue with weight loss efforts. She has agreed to the Category 3 Plan.   Exercise goals: 2 days of strength training plus 5 days of aerobic HIIT for 30  minutes.  Behavioral modification strategies: decreasing eating out, meal planning and cooking strategies, keeping healthy foods in the home, holiday eating strategies  and planning for success.  Trenice has agreed to follow-up with our clinic in 2 weeks. She was informed of the importance of frequent follow-up visits to maximize her success with intensive lifestyle modifications for her multiple health conditions.   Objective:   Blood pressure 134/79, pulse 69, temperature 98.5 F (36.9 C), temperature source Oral, height 5\' 5"  (1.651 m), weight 260 lb (117.9 kg), last menstrual period 02/10/2017, SpO2 99 %, unknown if currently breastfeeding. Body mass index is 43.27 kg/m.  General: Cooperative, alert, well developed, in no acute distress. HEENT: Conjunctivae and lids unremarkable. Cardiovascular: Regular rhythm.  Lungs: Normal work of breathing. Neurologic: No focal deficits.   Lab Results  Component Value Date   CREATININE 0.93 01/09/2020   BUN 13 01/09/2020   NA 134 (L) 01/09/2020   K 3.8 01/09/2020   CL 101 01/09/2020   CO2 26 01/09/2020   Lab Results  Component Value Date   ALT 34 01/09/2020   AST 26 01/09/2020   GGT 22 01/09/2020   ALKPHOS 105 01/09/2020   BILITOT 0.3 01/09/2020   Lab Results  Component Value Date   HGBA1C 5.6 01/09/2020   HGBA1C 5.9 (H) 08/09/2019   HGBA1C 5.9 (H) 07/26/2018   Lab Results  Component Value Date  INSULIN 13.3 01/11/2020   INSULIN 12.6 08/09/2019   Lab Results  Component Value Date   TSH 0.657 08/09/2019   Lab Results  Component Value Date   CHOL 210 (H) 01/11/2020   HDL 56 01/11/2020   LDLCALC 143 (H) 01/11/2020   TRIG 64 01/11/2020   CHOLHDL 3.8 01/11/2020   Lab Results  Component Value Date   WBC 3.7 (L) 01/09/2020   HGB 12.0 01/09/2020   HCT 36.3 01/09/2020   MCV 89.0 01/09/2020   PLT 206 01/09/2020   Attestation Statements:   Reviewed by clinician on day of visit: allergies, medications, problem list,  medical history, surgical history, family history, social history, and previous encounter notes.  I, Water quality scientist, CMA, am acting as Location manager for [employer], DO.  I have reviewed the above documentation for accuracy and completeness, and I agree with the above. -  *Marjory Sneddon, D.O.  The [place] was signed into law in 2016 which includes the topic of electronic health records.  This provides immediate access to information in MyChart.  This includes consultation notes, operative notes, office notes, lab results and pathology reports.  If you have any questions about what you read please let us know at your next visit so we can discuss your concerns and take corrective action if need be.  We are right here with you.

## 2020-03-23 ENCOUNTER — Ambulatory Visit: Payer: 59 | Admitting: Plastic Surgery

## 2020-03-23 ENCOUNTER — Other Ambulatory Visit (HOSPITAL_COMMUNITY): Payer: Self-pay | Admitting: Internal Medicine

## 2020-03-23 MED FILL — FLUARIX QUADRIVALENT 0.5 ML: 0.5 | 1 days supply | Qty: 1 | Fill #0

## 2020-03-28 ENCOUNTER — Other Ambulatory Visit: Payer: Self-pay | Admitting: *Deleted

## 2020-03-28 DIAGNOSIS — Z170 Estrogen receptor positive status [ER+]: Secondary | ICD-10-CM

## 2020-03-28 DIAGNOSIS — C50812 Malignant neoplasm of overlapping sites of left female breast: Secondary | ICD-10-CM

## 2020-03-29 ENCOUNTER — Telehealth: Payer: Self-pay | Admitting: *Deleted

## 2020-03-29 NOTE — Telephone Encounter (Signed)
[name]- LAB for Cycle 25- LVM for patient reminding her to fast for at least 8 hrs prior to her lab appointment tomorrow morning.  Reminded patient to not take Letrozole until after her lab appointment.  Asked patient to call back if any problem with this appt or any questions.  Otherwise will plan to see her in the morning. Thanked patient for her time.  Foye Spurling, BSN, RN Clinical Research Nurse 03/29/2020 9:48 AM

## 2020-03-30 ENCOUNTER — Encounter: Payer: Self-pay | Admitting: *Deleted

## 2020-03-30 ENCOUNTER — Other Ambulatory Visit: Payer: Self-pay

## 2020-03-30 ENCOUNTER — Inpatient Hospital Stay: Payer: 59 | Attending: Adult Health

## 2020-03-30 DIAGNOSIS — C50912 Malignant neoplasm of unspecified site of left female breast: Secondary | ICD-10-CM | POA: Diagnosis not present

## 2020-03-30 DIAGNOSIS — C50812 Malignant neoplasm of overlapping sites of left female breast: Secondary | ICD-10-CM | POA: Insufficient documentation

## 2020-03-30 DIAGNOSIS — Z170 Estrogen receptor positive status [ER+]: Secondary | ICD-10-CM

## 2020-03-30 DIAGNOSIS — E119 Type 2 diabetes mellitus without complications: Secondary | ICD-10-CM | POA: Diagnosis not present

## 2020-03-30 DIAGNOSIS — Z9221 Personal history of antineoplastic chemotherapy: Secondary | ICD-10-CM | POA: Insufficient documentation

## 2020-03-30 DIAGNOSIS — Z79811 Long term (current) use of aromatase inhibitors: Secondary | ICD-10-CM | POA: Insufficient documentation

## 2020-03-30 DIAGNOSIS — D649 Anemia, unspecified: Secondary | ICD-10-CM | POA: Diagnosis not present

## 2020-03-30 DIAGNOSIS — R7301 Impaired fasting glucose: Secondary | ICD-10-CM | POA: Diagnosis not present

## 2020-03-30 DIAGNOSIS — Z5111 Encounter for antineoplastic chemotherapy: Secondary | ICD-10-CM | POA: Insufficient documentation

## 2020-03-30 DIAGNOSIS — Z923 Personal history of irradiation: Secondary | ICD-10-CM | POA: Insufficient documentation

## 2020-03-30 DIAGNOSIS — E785 Hyperlipidemia, unspecified: Secondary | ICD-10-CM | POA: Diagnosis not present

## 2020-03-30 DIAGNOSIS — Z6841 Body Mass Index (BMI) 40.0 and over, adult: Secondary | ICD-10-CM | POA: Diagnosis not present

## 2020-03-30 DIAGNOSIS — Z0001 Encounter for general adult medical examination with abnormal findings: Secondary | ICD-10-CM | POA: Diagnosis not present

## 2020-03-30 LAB — CMP (CANCER CENTER ONLY)
ALT: 17 U/L (ref 0–44)
AST: 17 U/L (ref 15–41)
Albumin: 3.7 g/dL (ref 3.5–5.0)
Alkaline Phosphatase: 102 U/L (ref 38–126)
Anion gap: 4 — ABNORMAL LOW (ref 5–15)
BUN: 18 mg/dL (ref 6–20)
CO2: 26 mmol/L (ref 22–32)
Calcium: 9.2 mg/dL (ref 8.9–10.3)
Chloride: 104 mmol/L (ref 98–111)
Creatinine: 0.9 mg/dL (ref 0.44–1.00)
GFR, Estimated: >60 mL/min (ref >60)
Glucose, Bld: 85 mg/dL (ref 70–99)
Potassium: 3.9 mmol/L (ref 3.5–5.1)
Sodium: 134 mmol/L — ABNORMAL LOW (ref 135–145)
Total Bilirubin: 0.2 mg/dL — ABNORMAL LOW (ref 0.3–1.2)
Total Protein: 7.5 g/dL (ref 6.5–8.1)

## 2020-03-30 LAB — CBC WITH DIFFERENTIAL (CANCER CENTER ONLY)
Abs Immature Granulocytes: 0.01 10*3/uL (ref 0.00–0.07)
Basophils Absolute: 0 10*3/uL (ref 0.0–0.1)
Basophils Relative: 1 %
Eosinophils Absolute: 0 10*3/uL (ref 0.0–0.5)
Eosinophils Relative: 1 %
HCT: 35 % — ABNORMAL LOW (ref 36.0–46.0)
Hemoglobin: 11.7 g/dL — ABNORMAL LOW (ref 12.0–15.0)
Immature Granulocytes: 0 %
Lymphocytes Relative: 32 %
Lymphs Abs: 1.1 10*3/uL (ref 0.7–4.0)
MCH: 29.3 pg (ref 26.0–34.0)
MCHC: 33.4 g/dL (ref 30.0–36.0)
MCV: 87.7 fL (ref 80.0–100.0)
Monocytes Absolute: 0.4 10*3/uL (ref 0.1–1.0)
Monocytes Relative: 12 %
Neutro Abs: 1.9 10*3/uL (ref 1.7–7.7)
Neutrophils Relative %: 54 %
Platelet Count: 233 10*3/uL (ref 150–400)
RBC: 3.99 MIL/uL (ref 3.87–5.11)
RDW: 14.1 % (ref 11.5–15.5)
WBC Count: 3.5 10*3/uL — ABNORMAL LOW (ref 4.0–10.5)
nRBC: 0 % (ref 0.0–0.2)

## 2020-03-30 LAB — MAGNESIUM: Magnesium: 1.8 mg/dL (ref 1.7–2.4)

## 2020-03-30 LAB — LACTATE DEHYDROGENASE: LDH: 185 U/L (ref 98–192)

## 2020-03-30 LAB — AMYLASE: Amylase: 79 U/L (ref 28–100)

## 2020-03-30 LAB — LIPASE, BLOOD: Lipase: 27 U/L (ref 11–51)

## 2020-03-30 LAB — PHOSPHORUS: Phosphorus: 3.5 mg/dL (ref 2.5–4.6)

## 2020-03-30 LAB — GAMMA GT: GGT: 22 U/L (ref 7–50)

## 2020-03-30 LAB — BILIRUBIN, DIRECT: Bilirubin, Direct: 0.1 mg/dL (ref 0.0–0.2)

## 2020-03-30 LAB — URIC ACID: Uric Acid, Serum: 3.4 mg/dL (ref 2.5–7.1)

## 2020-03-30 LAB — RESEARCH LABS

## 2020-03-30 NOTE — Research (Signed)
[name]; Patient into clinic by herself this morning to have blood work done prior to starting cycle 25 of study. Patient confirmed she had been fasting for at least 8 hours prior to lab appointment.  Patient chose to complete study questionnaires on Monday's visit instead of this morning as she had another appointment this morning and was short on time today. Patient confirmed her appt with Dr. Jana Hakim on Monday 04/02/20 at 8:30 am.  Reminded patient to bring in all study medication bottles, leftover pills and completed medication diaries on Monday. Also reminded patient not to take letrozole prior to visit, but to wait to take in clinic with Ribociclib per study instructions.  Instructed patient she can take her letrozole today after she has her blood drawn.  Research nurse will call her if any abnormal lab results requiring intervention. Otherwise plan to see patient Monday morning as scheduled. Patient verbalized understanding.  Thanked patient for her participation.  Foye Spurling, BSN, RN Clinical Research Nurse 03/30/2020 12:22 PM

## 2020-04-01 NOTE — Progress Notes (Signed)
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  Her study nurse Cameo was also present during the visit  Mackenzie [name] is on study with [name]-- taking Ribociclb and Letrozole daily.  She is tolerating these medications without any major side effects.  The ribociclib of course is provided by [employer].  The patient is having no diarrhea or rash or other side effects from this that she is aware of.  The letrozole the patient obtains on her own for about $5 a month.  She has occasional hot flashes and mild vaginal dryness but no other significant side effects  She also continues on goserelin every 4 weeks.  Her estradiol was appropriately suppressed when we last checked it 01/09/2020 (less than 2.5).  Her most recent bone density screening on 03/16/2018, showed a T-score of -0.7, which is considered normal.    Her most recent mammography was 10/12/2019, showing density category C with no evidence of malignancy   REVIEW OF SYSTEMS: Mackenzie [name] tells me her left leg sometimes feels heavy.  This can be after she has been sitting a while and stands or sometimes when she goes walking.  However at times she goes walking on the leg does not feel heavy.  There has been no  erythema or swelling that she is aware of she exercises by walking 1 to 2 miles 2 or 3 times a week and she also has a Physiological scientist that she sees once a week.  She used to do swim aerobics but it is a little chilly right now for that she says.  A detailed review of systems today was otherwise stable   HISTORY OF CURRENT ILLNESS: From the original intake note:  The patient herself noted some changes in her left breast late July 2018, she says, and eventually brought this to medical attention so that on 01/09/2017 she underwent bilateral diagnostic mammography with tomography and left breast ultrasonography at the breast Center. This found the breast density to be category C. In the upper inner quadrant of the left breast there was an area of asymmetry and there were malignant type calcifications involving all 4 quadrants. On exam there is firmness and palpable thickening in the anterior left breast with skin dimpling. Ultrasonography found at the 9:30 o'clock radiant 3 cm from the nipple a 2.7 cm mass and in the left axilla for abnormal lymph nodes largest of which measured 2.7 cm.  Biopsy of the left breast 9:30 o'clock mass 80 01/26/2017 showed ([id number]) invasive ductal carcinoma, with extracellular mucin, grade 1 or 2. In the lower outer left breast is separated biopsy the same day showed ductal carcinoma in situ. One of the 4 lymph nodes involved was positive for metastatic carcinoma. Prognostic panel on the invasive disease showed it to be estrogen receptor 100% positive, progesterone receptor 20% positive, both with strong staining intensity, with an MIB-1  of 20%, and no HER-2 amplification with a signals ratio 1.38 and the number per cell 2.90.  On 01/22/2017 the patient underwent bilateral breast MRI. This showed no involvement of the right breast, but in the left breast there was a masslike and non-masslike enhancement involving all quadrants, with skin swelling but no abnormal enhancement  of the skin or nipple areolar complex or pectoralis muscle. The mass could not be clearly measured but spanned approximately 12 cm. There was bulky left axillary lymphadenopathy, with the largest lymph node measuring up to 3.4 cm.  The patient's subsequent history is as detailed below.   PAST MEDICAL HISTORY: Past Medical History:  Diagnosis Date  . Abnormal glucose 2018  . 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 Allergic rhinitis 2012  . Anemia 01/27/2017   prior to starting chemotherapy  . Breast cancer ([place]) 01/14/2017   Left breast  . 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 Edema, lower extremity   . Hot flashes 03/2017  . Hyperlipidemia 03/20/2016  . Morbid obesity with body mass index (BMI) of 40.0 to 49.9 ([place]) 09/17/2017  . NCGS (non-celiac gluten sensitivity)   . Pre-diabetes   . Seasonal allergies 2012   seasonal allergies causes allergic rhinitis and itchy, dry eyes per pt  . Vitamin D deficiency 05/2015    PAST SURGICAL HISTORY: Past Surgical History:  Procedure Laterality Date  . BREAST RECONSTRUCTION WITH PLACEMENT OF TISSUE EXPANDER AND FLEX HD (ACELLULAR HYDRATED DERMIS) Left 08/27/2017   Procedure: LEFT BREAST RECONSTRUCTION WITH PLACEMENT OF TISSUE EXPANDER AND FLEX HD;  Surgeon: Wallace Going, DO;  Location: [facility];  Service: Plastics;  Laterality: Left;  . CESAREAN SECTION     x2  . LATISSIMUS FLAP TO BREAST Left 11/03/2018  . LATISSIMUS FLAP TO BREAST Left 11/03/2018   Procedure: LATISSIMUS FLAP TO LEFT BREAST;  Surgeon: Wallace Going, DO;  Location: [facility];  Service: Plastics;  Laterality: Left;  Marland Kitchen MASTECTOMY MODIFIED RADICAL Left 08/27/2017  . MASTECTOMY MODIFIED RADICAL Left 08/27/2017   Procedure: LEFT MODIFIED RADICAL MASTECTOMY;  Surgeon: Erroll Luna, MD;  Location: [facility];  Service: General;  Laterality: Left;  Marland Kitchen MASTOPEXY Right 03/03/2019   Procedure: RIGHT BREAST MASTOPEXY/REDUCTION;  Surgeon:  Wallace Going, DO;  Location: [facility];  Service: Plastics;  Laterality: Right;  3 hours, please  . PORT-A-CATH REMOVAL Right 08/27/2017   Procedure: REMOVAL PORT-A-CATH RIGHT CHEST;  Surgeon: Erroll Luna, MD;  Location: [facility];  Service: General;  Laterality: Right;  . PORTACATH PLACEMENT Right 01/28/2017   Procedure: INSERTION PORT-A-CATH WITH ULTRASOUND;  Surgeon: Erroll Luna, MD;  Location: [facility];  Service: General;  Laterality: Right;  . REDUCTION MAMMAPLASTY Right   . REMOVAL OF TISSUE EXPANDER AND PLACEMENT OF IMPLANT Left 03/03/2019   Procedure: REMOVAL OF TISSUE EXPANDER AND PLACEMENT OF EXPANDER;  Surgeon: Wallace Going, DO;  Location: [facility];  Service: Plastics;  Laterality: Left;  . REMOVAL OF TISSUE EXPANDER AND PLACEMENT OF IMPLANT Left 05/25/2019   Procedure: LEFT BREAST REMOVAL OF TISSUE EXPANDER AND PLACEMENT OF IMPLANT;  Surgeon: Wallace Going, DO;  Location: [facility];  Service: Plastics;  Laterality: Left;  . TISSUE EXPANDER PLACEMENT Left 11/03/2018   Procedure: PLACEMENT OF TISSUE EXPANDER LEFT BREAST;  Surgeon: Wallace Going, DO;  Location: [facility];  Service: Plastics;  Laterality: Left;  Total case time is 3.5 hours  . TUBAL  LIGATION Bilateral 01/21/2011    FAMILY HISTORY Family History  Problem Relation Age of Onset  . Breast cancer Paternal Grandmother 22       d.60s from breast cancer. Did not have treatment.  . Other Mother        K.24 from complications of surgery to remove brain tumor  . Diabetes Mother   . Hypertension Mother   . Stroke Mother   . Kidney disease Father   . Hypertension Other   . Diabetes Other   . Stroke Other    The patient's father still alive at age 51. The patient's mother died with a brain tumor which the patient says was "benign". She was 56. The patient has one brother, no sisters. The only breast cancer in the family is a paternal  grandmother who died from breast cancer at an unknown age. There is no history of ovarian or prostate cancer in the family.   GYNECOLOGIC HISTORY:  Patient's last menstrual period was 02/10/2017. Menarche age 18 and first live birth age 29 she is Aten P4. The patient is still having regular periods as of August 2018   SOCIAL HISTORY: (Updated 05/31/2018) Mackenzie [name] works at [employer] on the fifth floor, [place]. She is separated from her husband (as of 05/2018), [name], who is a Building control surveyor.  1 daughter is married and lives in [place] and works as a Education administrator. Clarisa's second daughter, [name], lives in [place] and works in the police department. Daughters, Fowler and Coates are 53 and 6, living at home. The patient has a grandson she looks after. She attends a [employer].     ADVANCED DIRECTIVES: At the 05/03/2020 visit the patient tells me she has named her daughter [name] as her healthcare power of attorney.  [name] may be reached at [phone]   HEALTH MAINTENANCE: Social History   Tobacco Use  . Smoking status: Never Smoker  . Smokeless tobacco: Never Used  Vaping Use  . Vaping Use: Never used  Substance Use Topics  . Alcohol use: Yes    Comment: social  . Drug use: No    Colonoscopy: Never  PAP: December 2017  Bone density: Never   Allergies  Allergen Reactions  . Dilaudid [Hydromorphone] Itching    Current Outpatient Medications  Medication Sig Dispense Refill  . acetaminophen (TYLENOL) 500 MG tablet Take 1,000 mg by mouth every 6 (six) hours as needed (for pain/headaches.).     Marland Kitchen B Complex Vitamins (VITAMIN B COMPLEX PO) Take 1 capsule by mouth 2 (two) times daily.     . budesonide-formoterol (SYMBICORT) 80-4.5 MCG/ACT inhaler Inhale 2 puffs into the lungs every morning.     . Calcium Carb-Cholecalciferol (CALCIUM 600+D3 PO) Take 1 tablet by mouth 2 (two) times daily.     . cetirizine (ZYRTEC) 10 MG tablet Take 10 mg by mouth daily.      .  Cholecalciferol (VITAMIN D) 50 MCG (2000 UT) CAPS Take 1 capsule (2,000 Units total) by mouth daily. 30 capsule 0  . gabapentin (NEURONTIN) 300 MG capsule TAKE 1 CAPSULE (300 MG TOTAL) BY MOUTH AT BEDTIME. 90 capsule 4  . hydroxypropyl methylcellulose / hypromellose (ISOPTO TEARS / GONIOVISC) 2.5 % ophthalmic solution Place 1 drop into both eyes daily as needed for dry eyes.     Marland Kitchen ibuprofen (ADVIL,MOTRIN) 200 MG tablet Take 800 mg by mouth every 8 (eight) hours as needed for mild pain (for pain.).     . Investigational ribociclib (KISQALI) 200 MG tablet [name]  Study Take 2 tablets (400 mg total) by mouth daily. Take 2 tablets (431m total) by mouth daily on days 1-21.  Repeat every 28 days. 225 tablet 0  . letrozole (FEMARA) 2.5 MG tablet TAKE 1 TABLET BY MOUTH DAILY. 90 tablet 4  . loratadine (CLARITIN) 10 MG tablet Take 1 tablet (10 mg total) by mouth daily. (Patient taking differently: Take 10 mg by mouth daily as needed for allergies. ) 90 tablet 2  . metFORMIN (GLUCOPHAGE) 500 MG tablet Take 1 tablet (500 mg total) by mouth 2 (two) times daily with a meal. 60 tablet 0  . Multiple Vitamin (MULTIVITAMIN WITH MINERALS) TABS tablet Take 1 tablet by mouth daily.     . Omega-3 Fatty Acids (FISH OIL) 1000 MG CAPS Take 1,000 mg by mouth 2 (two) times daily.     . phenylephrine (SUDAFED PE) 10 MG TABS tablet Take 10 mg by mouth every 4 (four) hours as needed.     . Probiotic Product (PROBIOTIC DAILY PO) Take 1 capsule by mouth 2 (two) times daily.     . Turmeric 450 MG CAPS Take 450 mg by mouth 2 (two) times daily.    .Marland KitchenZINC OXIDE PO Take 1 tablet by mouth 2 (two) times a day.     No current facility-administered medications for this visit.   Facility-Administered Medications Ordered in Other Visits  Medication Dose Route Frequency Provider Last Rate Last Admin  . sodium chloride flush (NS) 0.9 % injection 10 mL  10 mL Intravenous PRN Magrinat, GVirgie Dad MD   10 mL at 03/10/17 1239    OBJECTIVE:  African-American woman in no acute distress Vitals:   04/02/20 0842  BP: 125/79  Pulse: 78  Resp: 17  Temp: (!) 97.5 F (36.4 C)  SpO2: 98%   Wt Readings from Last 3 Encounters:  04/02/20 264 lb 8 oz (120 kg)  03/21/20 260 lb (117.9 kg)  03/07/20 262 lb (118.8 kg)   Body mass index is 44.02 kg/m.   ECOG FS: 1  Sclerae unicteric, EOMs intact Wearing a mask No cervical or supraclavicular adenopathy Lungs no rales or rhonchi Heart regular rate and rhythm Abd soft, nontender, positive bowel sounds MSK no focal spinal tenderness, no upper extremity lymphedema Neuro: nonfocal, well oriented, appropriate affect Breasts: The right breast is status post reduction mammoplasty.  It is otherwise unremarkable.  The left breast is status post mastectomy and reconstruction.  There is no evidence of local recurrence.  Both axillae are benign.   LAB RESULTS:  No visits with results within 3 Day(s) from this visit.  Latest known visit with results is:  Appointment on 03/30/2020  Component Date Value Ref Range Status  . Research Labs 03/30/2020 Collected by Laboratory   Final   Performed at [name], [address] [place] [address] . Uric Acid, Serum 03/30/2020 3.4  2.5 - 7.1 mg/dL Final   Performed at [facility], [address] [place] [address] . Phosphorus 03/30/2020 3.5  2.5 - 4.6 mg/dL Final   Performed at [address] [place] [address] . Magnesium 03/30/2020 1.8  1.7 - 2.4 mg/dL Final   Performed at [facility], [address], [place] [address] . Lipase 03/30/2020 27  11 - 51 U/L Final   Performed at [facility] [address] [place] [address] .  LDH 03/30/2020 185  98 - 192 U/L Final   Performed at [facility], [place] [place], [place] [address]  . GGT  03/30/2020 22  7 - 50 U/L Final   Performed at [facility], [place] [address] [place], [place] [address]  . Sodium 03/30/2020 134* 135 - 145 mmol/L Final  . Potassium 03/30/2020 3.9  3.5 - 5.1 mmol/L Final  . Chloride 03/30/2020 104  98 - 111 mmol/L Final  . CO2 03/30/2020 26  22 - 32 mmol/L Final  . Glucose, Bld 03/30/2020 85  70 - 99 mg/dL Final   Glucose reference range applies only to samples taken after fasting for at least 8 hours.  . BUN 03/30/2020 18  6 - 20 mg/dL Final  . Creatinine 03/30/2020 0.90  0.44 - 1.00 mg/dL Final  . Calcium 03/30/2020 9.2  8.9 - 10.3 mg/dL Final  . Total Protein 03/30/2020 7.5  6.5 - 8.1 g/dL Final  . Albumin 03/30/2020 3.7  3.5 - 5.0 g/dL Final  . AST 03/30/2020 17  15 - 41 U/L Final  . ALT 03/30/2020 17  0 - 44 U/L Final  . Alkaline Phosphatase 03/30/2020 102  38 - 126 U/L Final  . Total Bilirubin 03/30/2020 0.2* 0.3 - 1.2 mg/dL Final  . GFR, Estimated 03/30/2020 >60  >60 mL/min Final   Comment: (NOTE) Calculated using the CKD-EPI Creatinine Equation (2021)   . Anion gap 03/30/2020 4* 5 - 15 Final   Performed at [facility], [place] [address], [place], [place] [address]  . WBC Count 03/30/2020 3.5* 4.0 - 10.5 K/uL Final  . RBC 03/30/2020 3.99  3.87 - 5.11 MIL/uL Final  . Hemoglobin 03/30/2020 11.7* 12.0 - 15.0 g/dL Final  . HCT 03/30/2020 35.0* 36 - 46 % Final  . MCV 03/30/2020 87.7  80.0 - 100.0 fL Final  . MCH 03/30/2020 29.3  26.0 - 34.0 pg Final  . MCHC 03/30/2020 33.4  30.0 - 36.0 g/dL Final  . RDW 03/30/2020 14.1  11.5 - 15.5 % Final  . Platelet Count 03/30/2020 233  150 - 400 K/uL Final  . nRBC 03/30/2020 0.0  0.0 - 0.2 % Final  . Neutrophils Relative % 03/30/2020 54  % Final  . Neutro Abs 03/30/2020 1.9  1.7 - 7.7 K/uL Final  . Lymphocytes Relative 03/30/2020 32  % Final  . Lymphs Abs 03/30/2020 1.1  0.7 - 4.0 K/uL Final  . Monocytes Relative 03/30/2020 12  % Final  . Monocytes Absolute  03/30/2020 0.4  0.1 - 1.0 K/uL Final  . Eosinophils Relative 03/30/2020 1  % Final  . Eosinophils Absolute 03/30/2020 0.0  0.0 - 0.5 K/uL Final  . Basophils Relative 03/30/2020 1  % Final  . Basophils Absolute 03/30/2020 0.0  0.0 - 0.1 K/uL Final  . Immature Granulocytes 03/30/2020 0  % Final  . Abs Immature Granulocytes 03/30/2020 0.01  0.00 - 0.07 K/uL Final   Performed at [facility], [place] [address] [place], [place] [address]  . Bilirubin, Direct 03/30/2020 0.1  0.0 - 0.2 mg/dL Final   Performed at [place] [address], [place], [place] [address]  . Amylase 03/30/2020 79  28 - 100 U/L Final   Performed at [facility], [place] [address] [place], [place] [address]     STUDIES: No results found.   ELIGIBLE FOR AVAILABLE RESEARCH PROTOCOL: [name], ASA study  ASSESSMENT: 49 y.o. [place] woman status post left breast upper inner quadrant biopsy 01/14/2017 for a clinical T3 N2, stage IIA invasive ductal carcinoma, grade 1 or 2, estrogen and progesterone receptor positive, HER-2 nonamplified, with an MIB-1 of 20%.  (1) staging studies: Brain MRI, bone scan, and CT scan of the chest 02/05/2017 showed no brain lesions, no lung or liver lesions, a 4.9 cm mass in the left breast with left axillary and subpectoral adenopathy, and nonspecific bone scan tracer at L2, left scapula, and anterior ribs, with lumbar spine MRI suggested for further evaluation.  (a) lumbar spine MRI 02/17/2017 showed no normal bone lesions.  There was mild lumbar spondylosis  (2) neoadjuvant chemotherapy consisting of cyclophosphamide and doxorubicin in dose dense fashion 4 started 02/10/2017, completed 03/24/2017, followed by weekly carboplatin and gemcitabine given days 1 and 8 of each 21-day cycle starting 04/14/2017, completing the planned 4 cycles 06/26/2017  (3) is post left modified radical mastectomy on 08/27/2017 showing an mpT3 pN2  residual invasive ductal carcinoma, grade 2, with a residual cancer burden of 3.  Margins were clear  (a) latissimus flap reconstruction with expander 11/03/2018  (4) postmastectomy radiation completed 01/01/2018  (a) capecitabine radiosensitization 11/16/2017-01/01/2018  (5) goserelin started 09/21/2017  (a) letrozole started 01/11/2018  (b) enrolled in [place] clinical trial, randomized to ribociclib on 05/31/2018  (c) Bone density on 03/16/2018 was normal with T score of -0.7 in the L1-L4 spine  (6) genetics testing 03/24/2017 through the Common Hereditary Cancer Panel offered by [facility] found no deleterious mutations in APC, ATM, AXIN2, BARD1, BMPR1A, BRCA1, BRCA2, BRIP1, CDH1, CDKN2A (p14ARF), CDKN2A (p16INK4a), CHEK2, CTNNA1, DICER1, EPCAM (Deletion/duplication testing only), GREM1 (promoter region deletion/duplication testing only), KIT, MEN1, MLH1, MSH2, MSH3, MSH6, MUTYH, NBN, NF1, NHTL1, PALB2, PDGFRA, PMS2, POLD1, POLE, PTEN, RAD50, RAD51C, RAD51D, SDHB, SDHC, SDHD, SMAD4, SMARCA4. STK11, TP53, TSC1, TSC2, and VHL.  The following genes were evaluated for sequence changes only: SDHA and HOXB13 c.251G>A variant only.    (7) Right breast reduction and left breast expander placement on 03/03/2019  (a) Implant exchanged on 05/25/2019 or a [employer] Smooth Round Ultra High Profile Gel 750cc. Ref #[id number].  Serial Number [id number]   PLAN: Mackenzie [name] is now a little over 2-1/2 years out from definitive surgery for her breast cancer with no evidence of disease recurrence.  This is very favorable.  She is doing very well on the ribociclib trial.  This medication will continue for a total of 3 years.  She is on letrozole under Zoladex coverage.  She is 88 and it is possible that she is already postmenopausal.  Either way to find out of course would be to stop the goserelin and follow [place] and estradiol on a monthly basis.  If there is any evidence of the ovaries functioning we would resume Zoladex.   We are inquiring of the study if this can be tried.  Otherwise she will continue on Zoladex as at present.  She tells me she is comfortable with either plan.  Today we reviewed healthcare power of attorney's issues and she tells me she has named her daughter [name] as her healthcare power of attorney.  I do not have a simple explanation for the heaviness of the left leg.  It is not constant, it is not progressive.  I suggested she do a little bit of warm up and stretching before her walks and perhaps that will help.  I did offer physical therapy but she did not want to pursue that at  this point.  At any rate I do not believe this symptom has anything to do with any of her medications. She will see me again in 3 months.  She knows to call for any other issue that may develop before the next visit  Total encounter time 30 minutes.Sarajane Jews C. Magrinat, MD  04/02/20 8:59 AM Medical Oncology and Hematology [facility] [place], [place] [address] Tel. [phone]    Fax. [phone]   I, Wilburn Mylar, am acting as scribe for Dr. [name]. Magrinat.  I, Lurline Del MD, have reviewed the above documentation for accuracy and completeness, and I agree with the above.   *Total Encounter Time as defined by the Centers for Medicare and Medicaid Services includes, in addition to the face-to-face time of a patient visit (documented in the note above) non-face-to-face time: obtaining and reviewing outside history, ordering and reviewing medications, tests or procedures, care coordination (communications with other health care professionals or caregivers) and documentation in the medical record.

## 2020-04-02 ENCOUNTER — Encounter: Payer: Self-pay | Admitting: *Deleted

## 2020-04-02 ENCOUNTER — Inpatient Hospital Stay: Payer: 59

## 2020-04-02 ENCOUNTER — Inpatient Hospital Stay (HOSPITAL_BASED_OUTPATIENT_CLINIC_OR_DEPARTMENT_OTHER): Payer: 59 | Admitting: Oncology

## 2020-04-02 ENCOUNTER — Other Ambulatory Visit: Payer: Self-pay

## 2020-04-02 VITALS — BP 125/79 | HR 78 | Temp 97.5°F | Resp 17 | Ht 65.0 in | Wt 264.5 lb

## 2020-04-02 DIAGNOSIS — C50812 Malignant neoplasm of overlapping sites of left female breast: Secondary | ICD-10-CM

## 2020-04-02 DIAGNOSIS — Z170 Estrogen receptor positive status [ER+]: Secondary | ICD-10-CM | POA: Diagnosis not present

## 2020-04-02 DIAGNOSIS — Z9221 Personal history of antineoplastic chemotherapy: Secondary | ICD-10-CM | POA: Diagnosis not present

## 2020-04-02 DIAGNOSIS — Z79811 Long term (current) use of aromatase inhibitors: Secondary | ICD-10-CM | POA: Diagnosis not present

## 2020-04-02 DIAGNOSIS — C50212 Malignant neoplasm of upper-inner quadrant of left female breast: Secondary | ICD-10-CM

## 2020-04-02 DIAGNOSIS — C773 Secondary and unspecified malignant neoplasm of axilla and upper limb lymph nodes: Secondary | ICD-10-CM

## 2020-04-02 DIAGNOSIS — Z5111 Encounter for antineoplastic chemotherapy: Secondary | ICD-10-CM | POA: Diagnosis not present

## 2020-04-02 DIAGNOSIS — Z006 Encounter for examination for normal comparison and control in clinical research program: Secondary | ICD-10-CM

## 2020-04-02 DIAGNOSIS — Z923 Personal history of irradiation: Secondary | ICD-10-CM | POA: Diagnosis not present

## 2020-04-02 DIAGNOSIS — C50912 Malignant neoplasm of unspecified site of left female breast: Secondary | ICD-10-CM

## 2020-04-02 MED ORDER — GOSERELIN ACETATE 3.6 MG ~~LOC~~ IMPL
DRUG_IMPLANT | SUBCUTANEOUS | Status: AC
  Filled 2020-04-02: qty 3.6

## 2020-04-02 MED ORDER — INV-RIBOCICLIB 200 MG TAB NATALEE TRIO003 STUDY: 400.0000 mg | ORAL_TABLET | Freq: Every day | ORAL | 0 refills | Status: DC

## 2020-04-02 MED ORDER — GOSERELIN ACETATE 3.6 MG ~~LOC~~ IMPL
3.6000 mg | DRUG_IMPLANT | Freq: Once | SUBCUTANEOUS | Status: AC
  Administered 2020-04-02: 3.6 mg via SUBCUTANEOUS

## 2020-04-02 NOTE — Research (Addendum)
[place] study: Patient into clinic by herself this morning to complete the study activities prior to starting cycles 25, 26 and 27 on study.    PROs;  Given to patient at registration prior to appointments today. Collected and checked for completeness and accuracy at the end of visit.   Labs; Collected per protocol on Friday 03/30/20.  Results reviewed by Dr. Jana Hakim. Results are all within study parameters to continue Ribociclib. Dr. Jana Hakim considers the slightly low sodium and slightly low bilirubin results as clinically insignificant.  Dr. Jana Hakim states the WBC at 3.5 is WNL for African Americans and therefore not considered low or clinically significant.  Corrected Calcium Formula:  4 g/dL utilized for "normal albumin;" local normal range is 3.5-5 g/dL. Corrected Calcium = (0.8 x [normal albumin - patient's albumin]) + serum calcium Correct Calcium = (0.8 x [4-3.7]) + 9.2 Corrected Calcium =  9.44 mg/dl Vitals; Vital signs completed after patient rested more than five minutes. Weight without shoes. See VS flowsheet.  Concomitant Medications; Reviewed medication list with patient. She reports she took Trulicity weekly from 8/01 to 10/23 for weight loss. It was causing some bruising and tenderness at injection site so patient is changing to Ozempic 0.$RemoveBefo'25mg'PaoUTCvtuSI$  weekly starting later this week on 04/07/20. Medication list updated. No other changes to her medication list. Physical Exam/PS - Please see documentation in today's office visit encounter with Dr. Jana Hakim. Adverse Events;  Patient reports new sensation of heaviness and numbness in left leg when walking.  It is intermittent. It does not affect her ability to walk or move or cause weakness or pain. Patient is unsure when she first noticed this but she has been noticing it more recently since her last visit 01/09/20. Dr. Jana Hakim states it is likely musculoskeletal related. Patient denies any other new or worsening AEs.  See AE table below for  full review of AEs and attributions.  Investigational Medication - Cycles 22, 23, and 24 study medication bottles [id number], [id number] and [phone] were returned by patient along with completed medication diaries. The medication bottles were returned to pharmacy today for accounting by pharmacist Raul Del.  Cycle 22 bottle #[id number] had 7 pills left out of 45 dispensed which equates to 2 missed days this cycle. Patient's diary reflects only 1 missed day of ribociclib and letrozole. Patient states she only remembers missing one day of medication, but it could be an error in her recording.  Cycle 23 bottle #[id number] had 5 pills left out of 45 dispensed which equates to 1 missed day this cycle. Patient's diary shows 1 missed days of ribociclib and 1 missed doses of letrozole.  Cycle 24 bottle [phone] had 3 pills left out of 45 dispensed which equates to 0 missed day this cycle. Patient's diary shows 0 missed days of ribociclib and letrozole this cycle.  Patient states all missed doses of ribociclib and letrozole were due to forgetting. She did not miss any doses on purpose or for any AEs.  We discussed strategies to improve compliance with daily ribociclib and letrozole.  These strategies include setting a daily timer on patient's cell phone and carrying her medications and medication diary with her in her pocketbook since she is sometimes at work when it is time to take her medications.  3 new bottles of investigational drug (to cover cycles 25 through 27) were assigned by IRT as kit #[id number], #[id number] and [phone]. These bottles were was dispensed by pharmacist Raul Del today continuing at the full dose of 400  mg daily. Patient was provided with the bottles and 3 copies of the patient diary for recording medication doses during cycles 25 through 27.  Patient self-administered ribociclib and letrozole in clinic under the supervision of the research nurse at 9:40 am.  Time and dosing completed in medication diary  for today. Patient understands that once a bottle of ribociclib is opened it should not be used after 30 days. Patient was instructed to open a new bottle with each cycle as marked and to retain the unopened and partial bottles for return at her next clinic visit. She verbalized understanding. Plan/Scheduling - Patient is interested in stopping goserelin injections if she is post menopausal and allowed by study. Dr. Jana Hakim suggests holding the goserelin injections and doing labwork monthly for estradiol and FSH levels. He states as long as the levels show patient's ovaries are not working we can hold the goserelin, but if the levels start changing to show ovaries are working, then he would order the goserelin injections again.  Research nurse will check with study and let Dr. Jana Hakim and patient know what is allowed on study. Schedule will be made after guidance received from study. Patient verbalized understanding.  Next study visit is due prior to cycle 28 on 06/25/20.  Patient is aware that she should contact the clinic at any time if she has any questions or concerns prior to next visit.  Thanked patient for her ongoing participation in this clnical trial.    AE Table: Event Grade Onset Date End Date Status Comments Attribution to Ribociclib Attribution to Letrozole Attribution to Goserelin  Allergic Rhinitis 2 2012  ongoing At Baseline n/a n/a n/a  Constipation 1 08/27/17  ongoing At Baseline n/a n/a n/a  Cough, intermittent 2 05/09/18  ongoing At Baseline n/a n/a n/a  GERD, intermittent 1 2018  ongoing At Baseline n/a n/a n/a  Gluten Sensitivity 1 12/2017  ongoing At Baseline n/a n/a n/a  Headaches 1 02/2017  ongoing At Baseline n/a n/a n/a  Hot Flashes 2 03/2017 04/02/20 ongoing At Baseline n/a n/a n/a  Hot Flashes 1 04/02/20  ongoing      Hyperglycemia 1 2018  ongoing At Baseline n/a n/a n/a  Hyperlipidemia 1 03/2016  ongoing At Baseline n/a n/a n/a  Vitamin D Deficiency 2 05/2015   ongoing At Baseline n/a n/a n/a  Arthralgias bilat legs 1 10/01/18  ongoing At Baseline     Bilateral foot pain/ Pes Planus 2   ongoing At Baseline, recently needed new orthotics     Fatigue 1 06/28/18  ongoing   Yes No No  Blurred Vision 1 07/14/18  ongoing  No No No  Hypertension  1-3 2012  ongoing At Baseline. Intermittent grade 1 - 3 recorded since 2012. Changed to History at Baseline.      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 Gabapentin Yes No No  Dyspnea on exertion 1 08/09/19  ongoing  No No No  Lymphedema bilateral arms 1 07/25/19  ongoing PHT and Sleeves No No No  Bilateral Knee Pain 2 07/11/19  ongoing Intermittent. PHT and steroid injections No No No  Nail Infection (fungus on toenails) 1 10/08/19  ongoing  No No No  Vaginal Dryness 1 10/17/19  ongoing  No Yes Yes  Alopecia 1 12/23/19  ongoing  No Yes Yes  Left Leg Heavy and Numb, Musculoskeletal, Other 1 01/09/20  ongoing  No No No  Anemia 1 04/02/20  ongoing  Yes No No    Foye Spurling, BSN, Therapist, sports Nurse 04/02/2020

## 2020-04-03 ENCOUNTER — Encounter: Payer: Self-pay | Admitting: Plastic Surgery

## 2020-04-03 ENCOUNTER — Ambulatory Visit: Payer: 59 | Admitting: Plastic Surgery

## 2020-04-03 ENCOUNTER — Encounter (INDEPENDENT_AMBULATORY_CARE_PROVIDER_SITE_OTHER): Payer: Self-pay | Admitting: Physician Assistant

## 2020-04-03 ENCOUNTER — Ambulatory Visit (INDEPENDENT_AMBULATORY_CARE_PROVIDER_SITE_OTHER): Payer: 59 | Admitting: Physician Assistant

## 2020-04-03 VITALS — BP 142/85 | HR 86 | Temp 98.3°F | Ht 65.0 in | Wt 258.0 lb

## 2020-04-03 VITALS — BP 126/80 | HR 92 | Temp 98.3°F

## 2020-04-03 DIAGNOSIS — E559 Vitamin D deficiency, unspecified: Secondary | ICD-10-CM | POA: Diagnosis not present

## 2020-04-03 DIAGNOSIS — Z6841 Body Mass Index (BMI) 40.0 and over, adult: Secondary | ICD-10-CM

## 2020-04-03 DIAGNOSIS — N651 Disproportion of reconstructed breast: Secondary | ICD-10-CM

## 2020-04-03 DIAGNOSIS — R7303 Prediabetes: Secondary | ICD-10-CM

## 2020-04-03 NOTE — Progress Notes (Signed)
   Subjective:    Patient ID: Mackenzie [name], female    DOB: [birth year], 49 y.o.   MRN: [id number]  The patient is a 49 year old female here for follow-up on her breast reconstruction.  Overall she is doing really well.  She has lost 10 pounds in the last several weeks.  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  She had a right breast mastopexy/reduction for symmetry.  She has no sign of infection or seroma.  The left breast is slightly less full than the right breast.  She likes the size of the right breast does not want to go smaller if she can help it.  She has prosthetic for her bra.  This works well but is a little bit too big on the left breast.  She is going to go and see if she can have it made smaller.  She was asking about fat filling on the left to see if that would help.     Review of Systems  Constitutional: Negative.   HENT: Negative.   Eyes: Negative.   Respiratory: Negative.  Negative for chest tightness.   Cardiovascular: Negative for chest pain.  Gastrointestinal: Negative for abdominal distention and abdominal pain.  Endocrine: Negative.   Genitourinary: Negative.   Musculoskeletal: Negative.        Objective:   Physical Exam Vitals and nursing note reviewed.  Constitutional:      Appearance: Normal appearance.  HENT:     Head: Normocephalic and atraumatic.  Cardiovascular:     Rate and Rhythm: Normal rate.     Pulses: Normal pulses.  Pulmonary:     Effort: Pulmonary effort is normal.  Skin:    Capillary Refill: Capillary refill takes less than 2 seconds.  Neurological:     General: No focal deficit present.     Mental Status: She is alert and oriented to person, place, and time.  Psychiatric:        Mood and Affect: Mood normal.        Behavior: Behavior normal.        Thought Content: Thought content normal.       Assessment & Plan:     ICD-10-CM   1. Breast asymmetry following  reconstructive surgery  N65.1     I am so happy for the patient and her progress.  We discussed options for fat filling of the left breast.  She would like to continue to decrease her weight over the next several months before doing anything.  I think this is a very smart decision.  I'll see her back in 6-9 months. Pictures were obtained of the patient and placed in the chart with the patient's or guardian's permission.

## 2020-04-04 NOTE — Progress Notes (Signed)
Chief Complaint:   OBESITY Mackenzie [name] is here to discuss her progress with her obesity treatment plan along with follow-up of her obesity related diagnoses. [name] is on the Category 3 Plan and states she is following her eating plan approximately 90% of the time. Lanai states she is walking 30-45 minutes 2-3 times per week.  Today's visit was #: 15 Starting weight: 267 lbs Starting date: 08/09/2019 Today's weight: 258 lbs Today's date: 04/03/2020 Total lbs lost to date: 9 Total lbs lost since last in-office visit: 2  Interim History: Viviann reports that she has been eating out less and has made some of the recipes we gave her. She wakes up late on Sundays and sometimes misses a meal. Overall, she feels that she is following the plan more closely and reports hunger is controlled.  Subjective:   Prediabetes. [name] has a diagnosis of prediabetes based on her elevated HgA1c and was informed this puts her at greater risk of developing diabetes. She continues to work on diet and exercise to decrease her risk of diabetes. She denies nausea or hypoglycemia. Zara is on Trulicity. Her PCP just gave her a sample of Ozempic and she is going to try that this week. She reports that she had some bruising from the Trulicity.  Lab Results  Component Value Date   HGBA1C 5.6 01/09/2020   Lab Results  Component Value Date   INSULIN 13.3 01/11/2020   INSULIN 12.6 08/09/2019   Vitamin D deficiency. [name] is on Vitamin D 2,000 units daily. Last level was not at goal. She will be due for labs at her next visit.   Ref. Range 01/09/2020 08:27  Vitamin D, 25-Hydroxy Latest Ref Range: 30 - 100 ng/mL 44.05   Assessment/Plan:   Prediabetes. [name] will continue to work on weight loss, exercise, and decreasing simple carbohydrates to help decrease the risk of diabetes. She will use the sample of Ozempic given by her PCP and will follow-up with her PCP as scheduled.  Vitamin D  deficiency. Low Vitamin D level contributes to fatigue and are associated with obesity, breast, and colon cancer. She agrees to continue to take Vitamin D as directed and will follow-up for routine testing of Vitamin D, at least 2-3 times per year to avoid over-replacement.  Class 3 severe obesity with serious comorbidity and body mass index (BMI) of 40.0 to 44.9 in adult, unspecified obesity type ([place]).  Shakya is currently in the action stage of change. As such, her goal is to continue with weight loss efforts. She has agreed to the Category 3 Plan.   Fasting labs will be checked next month.  Exercise goals: For substantial health benefits, adults should do at least 150 minutes (2 hours and 30 minutes) a week of moderate-intensity, or 75 minutes (1 hour and 15 minutes) a week of vigorous-intensity aerobic physical activity, or an equivalent combination of moderate- and vigorous-intensity aerobic activity. Aerobic activity should be performed in episodes of at least 10 minutes, and preferably, it should be spread throughout the week.  Behavioral modification strategies: meal planning and cooking strategies and keeping healthy foods in the home.  [name] has agreed to follow-up with our clinic in 2 weeks. She was informed of the importance of frequent follow-up visits to maximize her success with intensive lifestyle modifications for her multiple health conditions.   Objective:   Blood pressure (!) 142/85, pulse 86, temperature 98.3 F (36.8 C), height 5\' 5"  (1.651 m), weight 258 lb (117  kg), last menstrual period 02/10/2017, SpO2 97 %, unknown if currently breastfeeding. Body mass index is 42.93 kg/m.  General: Cooperative, alert, well developed, in no acute distress. HEENT: Conjunctivae and lids unremarkable. Cardiovascular: Regular rhythm.  Lungs: Normal work of breathing. Neurologic: No focal deficits.   Lab Results  Component Value Date   CREATININE 0.90 03/30/2020   BUN 18  03/30/2020   NA 134 (L) 03/30/2020   K 3.9 03/30/2020   CL 104 03/30/2020   CO2 26 03/30/2020   Lab Results  Component Value Date   ALT 17 03/30/2020   AST 17 03/30/2020   GGT 22 03/30/2020   ALKPHOS 102 03/30/2020   BILITOT 0.2 (L) 03/30/2020   Lab Results  Component Value Date   HGBA1C 5.6 01/09/2020   HGBA1C 5.9 (H) 08/09/2019   HGBA1C 5.9 (H) 07/26/2018   Lab Results  Component Value Date   INSULIN 13.3 01/11/2020   INSULIN 12.6 08/09/2019   Lab Results  Component Value Date   TSH 0.657 08/09/2019   Lab Results  Component Value Date   CHOL 210 (H) 01/11/2020   HDL 56 01/11/2020   LDLCALC 143 (H) 01/11/2020   TRIG 64 01/11/2020   CHOLHDL 3.8 01/11/2020   Lab Results  Component Value Date   WBC 3.5 (L) 03/30/2020   HGB 11.7 (L) 03/30/2020   HCT 35.0 (L) 03/30/2020   MCV 87.7 03/30/2020   PLT 233 03/30/2020   No results found for: IRON, TIBC, FERRITIN  Attestation Statements:   Reviewed by clinician on day of visit: allergies, medications, problem list, medical history, surgical history, family history, social history, and previous encounter notes.  Time spent on visit including pre-visit chart review and post-visit charting and care was 30 minutes.   IMichaelene Song, am acting as transcriptionist for Abby Potash, PA-C   I have reviewed the above documentation for accuracy and completeness, and I agree with the above. Abby Potash, PA-C

## 2020-04-05 ENCOUNTER — Other Ambulatory Visit: Payer: Self-pay | Admitting: Oncology

## 2020-04-05 ENCOUNTER — Telehealth: Payer: Self-pay | Admitting: Oncology

## 2020-04-05 DIAGNOSIS — C50212 Malignant neoplasm of upper-inner quadrant of left female breast: Secondary | ICD-10-CM

## 2020-04-05 NOTE — Telephone Encounter (Signed)
Scheduled per 10/25 los. Called pt and left a msg  °

## 2020-04-05 NOTE — Progress Notes (Signed)
We are going to check [employer] and estradiol a week before her next treatment 1122 and consider holding treatment to see if she might be postmenopausal.

## 2020-04-06 ENCOUNTER — Telehealth: Payer: Self-pay | Admitting: Adult Health

## 2020-04-06 ENCOUNTER — Telehealth: Payer: Self-pay | Admitting: *Deleted

## 2020-04-06 NOTE — Telephone Encounter (Addendum)
[name] Trial Regarding Holding Goserelin to check menopausal status;  Research nurse received the following guidance from the medical monitor for the [facility] trial regarding holding goserelin to check menopausal status.   "If at some point in the study, the patient is suspected to became postmenopausal (based on age, status of menses prior to starting goserelin, etc.), it is up to the investigator judgment to temporarily interrupt goserelin and carefully monitor for [facility], estradiol and resumption of menses. The site should proceed according to their usual practice for these patients, taking into account that estradiol and FSH levels normally return to pretreatment values within 12 weeks after the last dose of goserelin and by then, it will be possible to know in most cases if patient remains as premenopausal or not. [place] and estradiol should be tested serially per sites practice, considering that some guidelines recommend testing only after 12 weeks of interrupting goserelin (to reverse the suppressive effect on the hypothalamic-pituitary axis) and to have at least 2 samples taken 4-6 weeks apart. If there is any evidence that the patient is not truly postmenopausal (e.g. resumption of menses, FSH/estradiol not in postmenopausal range, etc.), goserelin must be promptly reintroduced"  Dr. Jana Hakim notified and he agreed that patient can hold goserelin injections and have [place] and estradiol checked prior to next study visit in 3 months.  The estradiol takes a week to get results so will schedule lab for hormone levels at one week prior to study visit and patient will also need regular study labs within 3 days of Cycle 28 visit.  Message left for patient informing of above and appointments. She agreed to this plan with labs in January. Instructed patient to continue to take Ribociclib and Letrozole as directed. Instructed patient to notify research nurse right away if menses resume while she is off the goserelin.   Patient verbalized understanding.  Scheduling message sent to Add Lab for TSH & Estradiol 06/15/20,  Research Labs on 06/22/20 and Study visit remains as scheduled on 06/25/20.  Foye Spurling, BSN, RN Clinical Research Nurse 04/06/2020 10:39 AM

## 2020-04-06 NOTE — Telephone Encounter (Signed)
Called pt per 10/28 sch msg - no answer. Left message for patient with new appt date and time

## 2020-04-18 ENCOUNTER — Other Ambulatory Visit: Payer: Self-pay

## 2020-04-18 ENCOUNTER — Other Ambulatory Visit (INDEPENDENT_AMBULATORY_CARE_PROVIDER_SITE_OTHER): Payer: Self-pay | Admitting: Family Medicine

## 2020-04-18 ENCOUNTER — Ambulatory Visit (INDEPENDENT_AMBULATORY_CARE_PROVIDER_SITE_OTHER): Payer: 59 | Admitting: Family Medicine

## 2020-04-18 ENCOUNTER — Encounter (INDEPENDENT_AMBULATORY_CARE_PROVIDER_SITE_OTHER): Payer: Self-pay | Admitting: Family Medicine

## 2020-04-18 VITALS — BP 132/82 | HR 68 | Temp 97.5°F | Ht 65.0 in | Wt 259.0 lb

## 2020-04-18 DIAGNOSIS — R030 Elevated blood-pressure reading, without diagnosis of hypertension: Secondary | ICD-10-CM | POA: Diagnosis not present

## 2020-04-18 DIAGNOSIS — Z6841 Body Mass Index (BMI) 40.0 and over, adult: Secondary | ICD-10-CM | POA: Diagnosis not present

## 2020-04-18 DIAGNOSIS — Z9189 Other specified personal risk factors, not elsewhere classified: Secondary | ICD-10-CM | POA: Diagnosis not present

## 2020-04-18 DIAGNOSIS — R7303 Prediabetes: Secondary | ICD-10-CM | POA: Diagnosis not present

## 2020-04-18 MED ORDER — METFORMIN HCL 500 MG PO TABS: 500.0000 mg | ORAL_TABLET | Freq: Two times a day (BID) | ORAL | 0 refills | Status: DC

## 2020-04-18 MED FILL — METFORMIN HCL 500 MG TABS: 500 | 30 days supply | Qty: 60 | Fill #0

## 2020-04-19 NOTE — Progress Notes (Signed)
Chief Complaint:   OBESITY Mackenzie [name] is here to discuss her progress with her obesity treatment plan along with follow-up of her obesity related diagnoses. Mackenzie [name] is on the Category 3 Plan and states she is following her eating plan approximately 90% of the time. Mackenzie [name] states she is working with a Physiological scientist for 30 minutes 1 time per week and walking for 30-45 minutes 2 times per week.  Today's visit was #: 26 Starting weight: 267 lbs Starting date: 08/09/2019 Today's weight: 259 lbs Today's date: 04/18/2020 Total lbs lost to date: 8 lbs Total lbs lost since last in-office visit: + lb Total weight loss percentage to date: -3.00%  Interim History: Mackenzie [name] says she is following the plan and it is going well.  Hunger and cravings are well controlled.  She is eating 8-10 ounces of protein for lunch and for dinnertime she eats "her lunch" frozen meal.  Assessment/Plan:   1. Prediabetes She is taking metformin daily and tolerating it well.  She has been started on Trulicity by her PCP, but could not tolerate the injection, so she was recently started on another GLP-1, which she thinks is Ozempic. She states the needle hurts less.   Plan:  She has a follow-up with her PCP next week.  Continue to follow prudent nutritional plan, bring [name] an updated medication list at next office visit.  Continue metformin.  Advised tp to explore the oral GLP-1 meds with her PCP if cannot tolerate injections. ( wrote down names for her )   Serum creatinine 0.9 on 03/30/2020.  Lab Results  Component Value Date   HGBA1C 5.6 01/09/2020   Lab Results  Component Value Date   INSULIN 13.3 01/11/2020   INSULIN 12.6 08/09/2019   -Refill metFORMIN (GLUCOPHAGE) 500 MG tablet; Take 1 tablet (500 mg total) by mouth 2 (two) times daily with a meal.  Dispense: 60 tablet; Refill: 0   2. Elevated blood pressure reading At last office visit, blood pressure was >443 systolic.  She has not checked it at home or  work.  Plan:  - Encouraged home blood pressure monitoring with goal of 130/80 or less.  Will consider arb or Ca chan blocker in future if remains up  - Ambulatory blood pressure monitoring encouraged at least 2-3 times weekly.  Keep log and bring in every office visit.  Reminded patient that if they ever feel poorly in any way, to check their blood pressure and pulse as well.  - Avoid continuous use of NSAIDs.   - Counseled patient on pathophysiology of disease and discussed various treatment options, which always includes dietary and lifestyle modification as first line.   - Lifestyle changes such as following our low salt, heart healthy meal plan and engaging in a regular exercise program discussed with patient.   - We will continue to monitor closely alongside PCP/ specialists.  Pt reminded to also f/up with those individuals as instructed by them.    BP Readings from Last 3 Encounters:  04/18/20 132/82  04/03/20 126/80  04/03/20 (!) 142/85   3. At risk for hypoglycemia Mackenzie [name] was given approximately 10 minutes of education and counseling today to help avoid hypotension. We discussed risks of hypotension with weight loss and signs of hypotension such as feeling lightheaded or unsteady.  4. Class 3 severe obesity with serious comorbidity and body mass index (BMI) of 40.0 to 44.9 in adult, unspecified obesity type (H[facility])  Mackenzie [name] is currently in the action stage of change.  As such, her goal is to continue with weight loss efforts. She has agreed to the Category 3 Plan.   Exercise goals: Increase walking to 30 minutes 5 days per week.  Behavioral modification strategies: decreasing simple carbohydrates, meal planning and cooking strategies and keeping healthy foods in the home.  Mackenzie [name] has agreed to follow-up with our clinic in 2-3 weeks with Abby Potash, PA-C. She was informed of the importance of frequent follow-up visits to maximize her success with intensive lifestyle  modifications for her multiple health conditions.   Objective:   Blood pressure 132/82, pulse 68, temperature (!) 97.5 F (36.4 C), height 5\' 5"  (1.651 m), weight 259 lb (117.5 kg), last menstrual period 02/10/2017, SpO2 98 %, unknown if currently breastfeeding. Body mass index is 43.1 kg/m.  General: Cooperative, alert, well developed, in no acute distress. HEENT: Conjunctivae and lids unremarkable. Cardiovascular: Regular rhythm.  Lungs: Normal work of breathing. Neurologic: No focal deficits.   Lab Results  Component Value Date   CREATININE 0.90 03/30/2020   BUN 18 03/30/2020   NA 134 (L) 03/30/2020   K 3.9 03/30/2020   CL 104 03/30/2020   CO2 26 03/30/2020   Lab Results  Component Value Date   ALT 17 03/30/2020   AST 17 03/30/2020   GGT 22 03/30/2020   ALKPHOS 102 03/30/2020   BILITOT 0.2 (L) 03/30/2020   Lab Results  Component Value Date   HGBA1C 5.6 01/09/2020   HGBA1C 5.9 (H) 08/09/2019   HGBA1C 5.9 (H) 07/26/2018   Lab Results  Component Value Date   INSULIN 13.3 01/11/2020   INSULIN 12.6 08/09/2019   Lab Results  Component Value Date   TSH 0.657 08/09/2019   Lab Results  Component Value Date   CHOL 210 (H) 01/11/2020   HDL 56 01/11/2020   LDLCALC 143 (H) 01/11/2020   TRIG 64 01/11/2020   CHOLHDL 3.8 01/11/2020   Lab Results  Component Value Date   WBC 3.5 (L) 03/30/2020   HGB 11.7 (L) 03/30/2020   HCT 35.0 (L) 03/30/2020   MCV 87.7 03/30/2020   PLT 233 03/30/2020   Attestation Statements:   Reviewed by clinician on day of visit: allergies, medications, problem list, medical history, surgical history, family history, social history, and previous encounter notes.  I, Water quality scientist, CMA, am acting as Location manager for [employer], DO.  I have reviewed the above documentation for accuracy and completeness, and I agree with the above. Marjory Sneddon, D.O.  The [place] was signed into law in 2016 which includes the  topic of electronic health records.  This provides immediate access to information in MyChart.  This includes consultation notes, operative notes, office notes, lab results and pathology reports.  If you have any questions about what you read please let us know at your next visit so we can discuss your concerns and take corrective action if need be.  We are right here with you.

## 2020-04-23 ENCOUNTER — Other Ambulatory Visit: Payer: 59

## 2020-04-26 MED FILL — GABAPENTIN 300 MG CAPSULE: 300 | 90 days supply | Qty: 90 | Fill #1

## 2020-04-30 ENCOUNTER — Other Ambulatory Visit: Payer: 59

## 2020-04-30 ENCOUNTER — Ambulatory Visit: Payer: 59 | Admitting: Adult Health

## 2020-04-30 ENCOUNTER — Ambulatory Visit: Payer: 59

## 2020-05-01 ENCOUNTER — Other Ambulatory Visit: Payer: Self-pay

## 2020-05-01 ENCOUNTER — Ambulatory Visit (INDEPENDENT_AMBULATORY_CARE_PROVIDER_SITE_OTHER): Payer: 59 | Admitting: Family Medicine

## 2020-05-01 ENCOUNTER — Encounter (INDEPENDENT_AMBULATORY_CARE_PROVIDER_SITE_OTHER): Payer: Self-pay | Admitting: Family Medicine

## 2020-05-01 VITALS — BP 128/81 | HR 76 | Temp 98.2°F | Ht 65.0 in | Wt 256.0 lb

## 2020-05-01 DIAGNOSIS — R7303 Prediabetes: Secondary | ICD-10-CM | POA: Diagnosis not present

## 2020-05-01 DIAGNOSIS — Z9189 Other specified personal risk factors, not elsewhere classified: Secondary | ICD-10-CM

## 2020-05-01 DIAGNOSIS — R030 Elevated blood-pressure reading, without diagnosis of hypertension: Secondary | ICD-10-CM | POA: Diagnosis not present

## 2020-05-01 DIAGNOSIS — Z6841 Body Mass Index (BMI) 40.0 and over, adult: Secondary | ICD-10-CM | POA: Diagnosis not present

## 2020-05-01 MED ORDER — SEMAGLUTIDE(0.25 OR 0.5MG/DOS) 2 MG/1.5ML ~~LOC~~ SOPN: PEN_INJECTOR | SUBCUTANEOUS | Status: DC

## 2020-05-07 NOTE — Progress Notes (Signed)
Chief Complaint:   OBESITY Mackenzie [name] is here to discuss her progress with her obesity treatment plan along with follow-up of her obesity related diagnoses. Mackenzie [name] is on the Category 3 Plan and states she is following her eating plan approximately 75-80% of the time. Mackenzie [name] states she is walking 25-30 minutes 1-2 times per week.  Today's visit was #: 64 Starting weight: 267 lbs Starting date: 08/09/2019 Today's weight: 256 lbs Today's date: 05/01/2020 Total lbs lost to date: 11 lbs Total lbs lost since last in-office visit: 3 lbs Total weight loss percentage to date: -4.12%  Interim History: Mackenzie [name] eats out 1-2 times a week. She is not sure what she did differently this past time. She did not hit goal of walking 30 minutes 5 days a week due to the cold weather and no time to get to the gym at work.  Assessment/Plan:   1. Pre-diabetes PCP started Evalyn on Ozempic and this past Sunday was her first injection. They did not discuss oral options and patient states she told MD that she could not do self injections.  Plan: Start Ozempic 0.25 for 2 weeks then increase to 0.5 mg for 2 weeks. Continue prudent nutritional plan. Recheck A1c mid-December or so.  - Semaglutide,0.25 or 0.5MG /DOS, 2 MG/1.5ML SOPN; 0.25 wkly * 2 wks, then inc to 0.5mg  wkly for 2 wks.  2. Elevated blood pressure reading Barb has not checked her blood pressure at home at all. 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  Plan: [name]'s blood pressure is at goal, essentially. Continue to closely monitor. Serum creatinine <1.0.  3. At risk for impaired metabolic function Areona was given approximately 15 minutes of impaired  metabolic function prevention counseling today. We discussed intensive lifestyle modifications today with an emphasis on specific nutrition and exercise instructions and strategies.   Repetitive spaced learning was employed today to elicit superior memory formation and  behavioral change.  4. Class 3 severe obesity with serious comorbidity and body mass index (BMI) of 40.0 to 44.9 in adult, unspecified obesity type (HCC) Sonakshi is currently in the action stage of change. As such, her goal is to continue with weight loss efforts. She has agreed to the Category 3 Plan.   Exercise goals: Increase to 30 minutes 5 days a week. For substantial health benefits, adults should do at least 150 minutes (2 hours and 30 minutes) a week of moderate-intensity, or 75 minutes (1 hour and 15 minutes) a week of vigorous-intensity aerobic physical activity, or an equivalent combination of moderate- and vigorous-intensity aerobic activity. Aerobic activity should be performed in episodes of at least 10 minutes, and preferably, it should be spread throughout the week.  Behavioral modification strategies: meal planning and cooking strategies, holiday eating strategies , celebration eating strategies and planning for success.  Mackenzie [name] has agreed to follow-up with our clinic in 2-3 weeks. We will obtain A1c at next office visit. She was informed of the importance of frequent follow-up visits to maximize her success with intensive lifestyle modifications for her multiple health conditions.   Objective:   Blood pressure 128/81, pulse 76, temperature 98.2 F (36.8 C), height 5\' 5"  (1.651 m), weight 256 lb (116.1 kg), last menstrual period 02/10/2017, SpO2 99 %, unknown if currently breastfeeding. Body mass index is 42.6 kg/m.  General: Cooperative, alert, well developed, in no acute distress. HEENT: Conjunctivae and lids unremarkable. Cardiovascular: Regular rhythm.  Lungs: Normal work of breathing. Neurologic: No focal deficits.  Lab Results  Component Value Date   CREATININE 0.90 03/30/2020   BUN 18 03/30/2020   NA 134 (L) 03/30/2020   K 3.9 03/30/2020   CL 104 03/30/2020   CO2 26 03/30/2020   Lab Results  Component Value Date   ALT 17 03/30/2020   AST 17 03/30/2020    GGT 22 03/30/2020   ALKPHOS 102 03/30/2020   BILITOT 0.2 (L) 03/30/2020   Lab Results  Component Value Date   HGBA1C 5.6 01/09/2020   HGBA1C 5.9 (H) 08/09/2019   HGBA1C 5.9 (H) 07/26/2018   Lab Results  Component Value Date   INSULIN 13.3 01/11/2020   INSULIN 12.6 08/09/2019   Lab Results  Component Value Date   TSH 0.657 08/09/2019   Lab Results  Component Value Date   CHOL 210 (H) 01/11/2020   HDL 56 01/11/2020   LDLCALC 143 (H) 01/11/2020   TRIG 64 01/11/2020   CHOLHDL 3.8 01/11/2020   Lab Results  Component Value Date   WBC 3.5 (L) 03/30/2020   HGB 11.7 (L) 03/30/2020   HCT 35.0 (L) 03/30/2020   MCV 87.7 03/30/2020   PLT 233 03/30/2020    Attestation Statements:   Reviewed by clinician on day of visit: allergies, medications, problem list, medical history, surgical history, family history, social history, and previous encounter notes.  Coral Ceo, am acting as Location manager for [employer], DO.  I have reviewed the above documentation for accuracy and completeness, and I agree with the above. Marjory Sneddon, D.O.  The [place] was signed into law in 2016 which includes the topic of electronic health records.  This provides immediate access to information in MyChart.  This includes consultation notes, operative notes, office notes, lab results and pathology reports.  If you have any questions about what you read please let us know at your next visit so we can discuss your concerns and take corrective action if need be.  We are right here with you.

## 2020-05-16 ENCOUNTER — Encounter (INDEPENDENT_AMBULATORY_CARE_PROVIDER_SITE_OTHER): Payer: Self-pay | Admitting: Family Medicine

## 2020-05-16 ENCOUNTER — Other Ambulatory Visit: Payer: Self-pay

## 2020-05-16 ENCOUNTER — Other Ambulatory Visit (INDEPENDENT_AMBULATORY_CARE_PROVIDER_SITE_OTHER): Payer: Self-pay | Admitting: Family Medicine

## 2020-05-16 ENCOUNTER — Ambulatory Visit (INDEPENDENT_AMBULATORY_CARE_PROVIDER_SITE_OTHER): Payer: 59 | Admitting: Family Medicine

## 2020-05-16 VITALS — BP 128/86 | HR 76 | Temp 97.9°F | Ht 65.0 in | Wt 260.0 lb

## 2020-05-16 DIAGNOSIS — R7303 Prediabetes: Secondary | ICD-10-CM | POA: Diagnosis not present

## 2020-05-16 DIAGNOSIS — Z9189 Other specified personal risk factors, not elsewhere classified: Secondary | ICD-10-CM

## 2020-05-16 DIAGNOSIS — E559 Vitamin D deficiency, unspecified: Secondary | ICD-10-CM | POA: Insufficient documentation

## 2020-05-16 DIAGNOSIS — R632 Polyphagia: Secondary | ICD-10-CM

## 2020-05-16 DIAGNOSIS — Z6841 Body Mass Index (BMI) 40.0 and over, adult: Secondary | ICD-10-CM

## 2020-05-16 MED ORDER — OZEMPIC (1 MG/DOSE) 4 MG/3ML ~~LOC~~ SOPN: PEN_INJECTOR | SUBCUTANEOUS | Status: DC

## 2020-05-16 MED ORDER — METFORMIN HCL 500 MG PO TABS: 500.0000 mg | ORAL_TABLET | Freq: Two times a day (BID) | ORAL | 0 refills | Status: DC

## 2020-05-16 MED ORDER — VITAMIN D (ERGOCALCIFEROL) 1.25 MG (50000 UNIT) PO CAPS: ORAL_CAPSULE | ORAL | 0 refills | Status: DC

## 2020-05-16 MED ORDER — SEMAGLUTIDE(0.25 OR 0.5MG/DOS) 2 MG/1.5ML ~~LOC~~ SOPN: PEN_INJECTOR | SUBCUTANEOUS | 0 refills | Status: DC

## 2020-05-16 MED FILL — METFORMIN HCL 500 MG TABS: 500 | 30 days supply | Qty: 60 | Fill #0

## 2020-05-16 MED FILL — OZEMPIC (1 MG/DOSE) 4 MG/3M: 4 | 28 days supply | Qty: 3 | Fill #0

## 2020-05-16 MED FILL — VIT D2 1.25 MG (50,000 UNIT: 1.25 MG | 30 days supply | Qty: 3 | Fill #0

## 2020-05-16 NOTE — Progress Notes (Signed)
Chief Complaint:   OBESITY Mackenzie [name] is here to discuss her progress with her obesity treatment plan along with follow-up of her obesity related diagnoses. Mackenzie [name] is on the Category 3 Plan and states she is following her eating plan approximately 90% of the time. Mackenzie [name] states she is walking 30 minutes 3 times per week.  Today's visit was #: 22 Starting weight: 267 lbs Starting date: 08/09/2019 Today's weight: 260 lbs Today's date: 05/22/2020 Total lbs lost to date: 7 lbs Total lbs lost since last in-office visit: +4 lbs Total weight loss percentage to date: -2.62%  Interim History: Mackenzie [name] reports the plan is going well and is not hard to follow or plan/prep. Her hunger and cravings are controlled. She does occasionally skip dinner or foods on the plan, but tries to get her proteins in. Mackenzie [name] is doing very little exercise.  Assessment/Plan:   1. Pre-diabetes Mackenzie [name] started Ozempic at her last OV and is tolerating it well without side effects. She is also prescribed Metformin. Mackenzie [name] has a diagnosis of prediabetes based on her elevated HgA1c and was informed this puts her at greater risk of developing diabetes. She continues to work on diet and exercise to decrease her risk of diabetes. She denies nausea or hypoglycemia.  Lab Results  Component Value Date   HGBA1C 5.6 01/09/2020   Lab Results  Component Value Date   INSULIN 13.3 01/11/2020   INSULIN 12.6 08/09/2019   Plan: Refill Metformin for 1 month, as per below. Mackenzie [name] will continue to work on weight loss, exercise, and decreasing simple carbohydrates to help decrease the risk of diabetes.   Refill- metFORMIN (GLUCOPHAGE) 500 MG tablet; Take 1 tablet (500 mg total) by mouth 2 (two) times daily with a meal.  Dispense: 60 tablet; Refill: 0  2. Polyphagia Mackenzie [name] started Ozempic at her last OV and is tolerating it well without side effects.   Plan: Refill Ozempic at 1 mg weekly (after she does 0.5 mg for 2 weeks) for  1 month, as per below. Continue current treatment plan.  Refill- Semaglutide,0.25 or 0.5MG /DOS, 2 MG/1.5ML SOPN; 1mg  wkly.  Dispense: 3 mL; Refill: 0   3. Vitamin D deficiency Mackenzie [name]'s Vitamin D level was 44.05 on 01/09/2020. She is currently taking OTC vitamin D 2000 units each day. She denies nausea, vomiting or muscle weakness.  Plan:  1.Stop OTC Vit D 2,000 units 2. Start prescription Vit D 1 tablet by mouth every 10 days. #3 with no refills  3. Low Vitamin D level contributes to fatigue and are associated with obesity, breast, and colon cancer. She agrees to continue to take prescription Vitamin D @50 ,000 IU every week and will follow-up for routine testing of Vitamin D, at least 2-3 times per year to avoid over-replacement.  4. At risk for osteoporosis Mackenzie [name] was given approximately 15 minutes of osteoporosis prevention counseling today.   Mackenzie [name] is at risk for osteopenia and osteoporosis due to Vitamin D deficiency, as well as other risk factors. We discussed the importance of prudent screenings through her PCP's office for prevention. Mackenzie [name] was encouraged to take her Vitamin D and follow her calcium rich diet.  We will continue to monitor vitamin D levels to ensure treatment is appropriate. It is recommended that she eventually engage in weight bearing exercises and muscle strengthening exercises to help improve bone density and decrease her risk of osteopenia and osteoporosis.  5. Class 3 severe obesity with serious comorbidity and body mass index (BMI) of 40.0 to 44.9  in adult, unspecified obesity type (Mackenzie [name]) Mackenzie [name] is currently in the action stage of change. As such, her goal is to continue with weight loss efforts. She has agreed to the Category 3 Plan.   Exercise goals: As is, but try to increase to 5 days a week.  Behavioral modification strategies: no skipping meals, meal planning and cooking strategies, keeping healthy foods in the home and planning for success.  Mackenzie [name] has  agreed to follow-up with our clinic in 2-4 weeks. She was informed of the importance of frequent follow-up visits to maximize her success with intensive lifestyle modifications for her multiple health conditions.   Objective:   Blood pressure 128/86, pulse 76, temperature 97.9 F (36.6 C), height 5\' 5"  (1.651 m), weight 260 lb (117.9 kg), last menstrual period 02/10/2017, SpO2 99 %, unknown if currently breastfeeding. Body mass index is 43.27 kg/m.  General: Cooperative, alert, well developed, in no acute distress. HEENT: Conjunctivae and lids unremarkable. Cardiovascular: Regular rhythm.  Lungs: Normal work of breathing. Neurologic: No focal deficits.   Lab Results  Component Value Date   CREATININE 0.90 03/30/2020   BUN 18 03/30/2020   NA 134 (L) 03/30/2020   K 3.9 03/30/2020   CL 104 03/30/2020   CO2 26 03/30/2020   Lab Results  Component Value Date   ALT 17 03/30/2020   AST 17 03/30/2020   GGT 22 03/30/2020   ALKPHOS 102 03/30/2020   BILITOT 0.2 (L) 03/30/2020   Lab Results  Component Value Date   HGBA1C 5.6 01/09/2020   HGBA1C 5.9 (H) 08/09/2019   HGBA1C 5.9 (H) 07/26/2018   Lab Results  Component Value Date   INSULIN 13.3 01/11/2020   INSULIN 12.6 08/09/2019   Lab Results  Component Value Date   TSH 0.657 08/09/2019   Lab Results  Component Value Date   CHOL 210 (H) 01/11/2020   HDL 56 01/11/2020   LDLCALC 143 (H) 01/11/2020   TRIG 64 01/11/2020   CHOLHDL 3.8 01/11/2020   Lab Results  Component Value Date   WBC 3.5 (L) 03/30/2020   HGB 11.7 (L) 03/30/2020   HCT 35.0 (L) 03/30/2020   MCV 87.7 03/30/2020   PLT 233 03/30/2020    Attestation Statements:   Reviewed by clinician on day of visit: allergies, medications, problem list, medical history, surgical history, family history, social history, and previous encounter notes.  Coral Ceo, am acting as Location manager for [employer], DO.  I have reviewed the above documentation for  accuracy and completeness, and I agree with the above. Marjory Sneddon, D.O.  The [place] was signed into law in 2016 which includes the topic of electronic health records.  This provides immediate access to information in MyChart.  This includes consultation notes, operative notes, office notes, lab results and pathology reports.  If you have any questions about what you read please let us know at your next visit so we can discuss your concerns and take corrective action if need be.  We are right here with you.

## 2020-05-18 DIAGNOSIS — M179 Osteoarthritis of knee, unspecified: Secondary | ICD-10-CM | POA: Diagnosis not present

## 2020-05-18 DIAGNOSIS — D649 Anemia, unspecified: Secondary | ICD-10-CM | POA: Diagnosis not present

## 2020-05-18 DIAGNOSIS — Z0001 Encounter for general adult medical examination with abnormal findings: Secondary | ICD-10-CM | POA: Diagnosis not present

## 2020-05-25 MED FILL — VIT D2 1.25 MG (50,000 UNIT: 1.25 MG | 30 days supply | Qty: 3 | Fill #0

## 2020-05-25 MED FILL — OZEMPIC (1 MG/DOSE) 4 MG/3M: 4 | 28 days supply | Qty: 3 | Fill #0

## 2020-05-25 MED FILL — METFORMIN HCL 500 MG TABS: 500 | 30 days supply | Qty: 60 | Fill #0

## 2020-05-28 ENCOUNTER — Ambulatory Visit: Payer: 59

## 2020-05-28 ENCOUNTER — Other Ambulatory Visit: Payer: 59

## 2020-05-30 ENCOUNTER — Other Ambulatory Visit: Payer: Self-pay

## 2020-05-30 ENCOUNTER — Encounter (INDEPENDENT_AMBULATORY_CARE_PROVIDER_SITE_OTHER): Payer: Self-pay | Admitting: Adult Health

## 2020-05-30 ENCOUNTER — Ambulatory Visit (INDEPENDENT_AMBULATORY_CARE_PROVIDER_SITE_OTHER): Payer: 59 | Admitting: Adult Health

## 2020-05-30 VITALS — BP 136/81 | HR 75 | Temp 98.0°F | Ht 65.0 in | Wt 258.0 lb

## 2020-05-30 DIAGNOSIS — R7303 Prediabetes: Secondary | ICD-10-CM | POA: Diagnosis not present

## 2020-05-30 DIAGNOSIS — Z6841 Body Mass Index (BMI) 40.0 and over, adult: Secondary | ICD-10-CM

## 2020-05-30 DIAGNOSIS — R632 Polyphagia: Secondary | ICD-10-CM | POA: Diagnosis not present

## 2020-05-30 DIAGNOSIS — C50912 Malignant neoplasm of unspecified site of left female breast: Secondary | ICD-10-CM | POA: Diagnosis not present

## 2020-05-30 DIAGNOSIS — E119 Type 2 diabetes mellitus without complications: Secondary | ICD-10-CM | POA: Diagnosis not present

## 2020-05-30 DIAGNOSIS — Z9011 Acquired absence of right breast and nipple: Secondary | ICD-10-CM | POA: Diagnosis not present

## 2020-05-30 NOTE — Progress Notes (Signed)
Chief Complaint:   OBESITY Mackenzie [name] is here to discuss her progress with her obesity treatment plan along with follow-up of her obesity related diagnoses. [name] is on the Category 3 Plan and states she is following her eating plan approximately 85% of the time. [name] states she is doing aerobics/Zumba 45 minutes 2 times per week.  Today's visit was #: 36 Starting weight: 267 lbs Starting date: 08/09/2019 Today's weight: 258 lbs Today's date: 05/30/2020 Total lbs lost to date: 9 Total lbs lost since last in-office visit: 2  Interim History: Candies has utilized several of the [place] recipes, which has expanded variety for dinner meals. She has changed the timing of lunch and dinner to better accommodate her demanding schedule.    Intake weight 266 lbs; she hopes to lose ~75 lbs, final weight of 191 with a BMI 31.  Subjective:   Polyphagia. Mackenzie [name] was started on Ozempic 05/01/2020 and has titrated up to 0.5 mg once a week. She denies mass in neck, dyspepsia, dysphagia, or persistent hoarseness. She will increase Ozempic to 1 mg once a week per the last week of December. She reports a decrease in polyphagia.  Prediabetes. Mackenzie [name] has a diagnosis of prediabetes based on her elevated HgA1c and was informed this puts her at greater risk of developing diabetes. She continues to work on diet and exercise to decrease her risk of diabetes. She denies nausea or hypoglycemia. A1c is steadily improving. Mackenzie [name] is on metformin 500 mg BID and denies GI upset.  She is also on injectable GLP-1.  Lab Results  Component Value Date   HGBA1C 5.6 01/09/2020   Lab Results  Component Value Date   INSULIN 13.3 01/11/2020   INSULIN 12.6 08/09/2019   Assessment/Plan:   Polyphagia. [name] will follow the Ozempic titration plan and will increase to 1 mg the last week of December.  Prediabetes. Mackenzie [name] will continue to work on weight loss, exercise, and decreasing simple carbohydrates to  help decrease the risk of diabetes. She will continue to take metformin as directed and will continue to follow the Category 3 meal plan.  Class 3 severe obesity with serious comorbidity and body mass index (BMI) of 40.0 to 44.9 in adult, unspecified obesity type ([place]).  Mackenzie [name] is currently in the action stage of change. As such, her goal is to continue with weight loss efforts. She has agreed to the Category 3 Plan.   Handouts were provided on [employer], [employer], and [employer].  Exercise goals: Mackenzie [name] will continue aerobics/Zumba 45 minutes 2 times per week.  Behavioral modification strategies: increasing lean protein intake, no skipping meals, meal planning and cooking strategies and planning for success.  Mackenzie [name] has agreed to follow-up with our clinic in 4 weeks. She was informed of the importance of frequent follow-up visits to maximize her success with intensive lifestyle modifications for her multiple health conditions.   Objective:   Blood pressure 136/81, pulse 75, temperature 98 F (36.7 C), height 5\' 5"  (1.651 m), weight 258 lb (117 kg), last menstrual period 02/10/2017, SpO2 99 %, unknown if currently breastfeeding. Body mass index is 42.93 kg/m.  General: Cooperative, alert, well developed, in no acute distress. HEENT: Conjunctivae and lids unremarkable. Cardiovascular: Regular rhythm.  Lungs: Normal work of breathing. Neurologic: No focal deficits.   Lab Results  Component Value Date   CREATININE 0.90 03/30/2020   BUN 18 03/30/2020   NA 134 (L) 03/30/2020   K 3.9 03/30/2020   CL 104  03/30/2020   CO2 26 03/30/2020   Lab Results  Component Value Date   ALT 17 03/30/2020   AST 17 03/30/2020   GGT 22 03/30/2020   ALKPHOS 102 03/30/2020   BILITOT 0.2 (L) 03/30/2020   Lab Results  Component Value Date   HGBA1C 5.6 01/09/2020   HGBA1C 5.9 (H) 08/09/2019   HGBA1C 5.9 (H) 07/26/2018   Lab Results  Component Value Date   INSULIN 13.3  01/11/2020   INSULIN 12.6 08/09/2019   Lab Results  Component Value Date   TSH 0.657 08/09/2019   Lab Results  Component Value Date   CHOL 210 (H) 01/11/2020   HDL 56 01/11/2020   LDLCALC 143 (H) 01/11/2020   TRIG 64 01/11/2020   CHOLHDL 3.8 01/11/2020   Lab Results  Component Value Date   WBC 3.5 (L) 03/30/2020   HGB 11.7 (L) 03/30/2020   HCT 35.0 (L) 03/30/2020   MCV 87.7 03/30/2020   PLT 233 03/30/2020   No results found for: IRON, TIBC, FERRITIN  Attestation Statements:   Reviewed by clinician on day of visit: allergies, medications, problem list, medical history, surgical history, family history, social history, and previous encounter notes.  Time spent on visit including pre-visit chart review and post-visit charting and care was 35 minutes.   I, Michaelene Song, am acting as Location manager for [employer], NP-C   I have reviewed the above documentation for accuracy and completeness, and I agree with the above. -  Laurin Paulo d. Naida Escalante, NP-C

## 2020-05-31 MED FILL — LETROZOLE 2.5 MG TABS: 2.5 | 90 days supply | Qty: 90 | Fill #1

## 2020-06-07 ENCOUNTER — Telehealth: Payer: Self-pay | Admitting: *Deleted

## 2020-06-07 DIAGNOSIS — Z170 Estrogen receptor positive status [ER+]: Secondary | ICD-10-CM

## 2020-06-07 DIAGNOSIS — C50212 Malignant neoplasm of upper-inner quadrant of left female breast: Secondary | ICD-10-CM

## 2020-06-07 NOTE — Telephone Encounter (Signed)
Mackenzie [name];  Left VM for patient reminding of lab appt scheduled next week on 06/15/20 to check hormone levels and the lab the following week on 06/22/20 for pre treatment assessment.  The estradiol takes a week to get results so it is scheduled a week before study visit.   Asked patient to return call if any questions or if she needs to change the appointment for any reason.  [name] Reamer, BSN, RN Clinical Research Nurse 06/07/2020 11:13 AM

## 2020-06-11 ENCOUNTER — Other Ambulatory Visit: Payer: Self-pay | Admitting: Surgical

## 2020-06-11 DIAGNOSIS — Z170 Estrogen receptor positive status [ER+]: Secondary | ICD-10-CM

## 2020-06-11 DIAGNOSIS — C50212 Malignant neoplasm of upper-inner quadrant of left female breast: Secondary | ICD-10-CM

## 2020-06-11 DIAGNOSIS — Z9889 Other specified postprocedural states: Secondary | ICD-10-CM

## 2020-06-13 ENCOUNTER — Other Ambulatory Visit: Payer: Self-pay

## 2020-06-13 ENCOUNTER — Encounter (INDEPENDENT_AMBULATORY_CARE_PROVIDER_SITE_OTHER): Payer: Self-pay | Admitting: Physician Assistant

## 2020-06-13 ENCOUNTER — Ambulatory Visit (INDEPENDENT_AMBULATORY_CARE_PROVIDER_SITE_OTHER): Payer: 59 | Admitting: Physician Assistant

## 2020-06-13 VITALS — BP 114/77 | HR 87 | Temp 98.1°F | Ht 65.0 in | Wt 259.0 lb

## 2020-06-13 DIAGNOSIS — E7849 Other hyperlipidemia: Secondary | ICD-10-CM

## 2020-06-13 DIAGNOSIS — R7303 Prediabetes: Secondary | ICD-10-CM

## 2020-06-13 DIAGNOSIS — Z6841 Body Mass Index (BMI) 40.0 and over, adult: Secondary | ICD-10-CM | POA: Diagnosis not present

## 2020-06-14 NOTE — Progress Notes (Signed)
Chief Complaint:   OBESITY Mackenzie [name] is here to discuss her progress with her obesity treatment plan along with follow-up of her obesity related diagnoses. Mackenzie [name] is on the Category 3 Plan and states she is following her eating plan approximately 80% of the time. Mackenzie [name] states she is walking for 30 minutes 3 times per week.  Today's visit was #: 20 Starting weight: 267 lbs Starting date: 08/09/2019 Today's weight: 259 lbs Today's date: 06/13/2020 Total lbs lost to date: 8 Total lbs lost since last in-office visit: 0  Interim History: Mackenzie [name] reports that she was somewhat off the plan over the holidays. She is able to get all of the food in on the plan, and she states that Mackenzie [name] helps control her hunger between meals.  Subjective:   1. Pre-diabetes Mackenzie [name] is on Mackenzie [name] 1 mg, and it helps control her appetite well. She denies nausea, vomiting, or diarrhea. She is also taking metformin and is tolerating it well.  2. Other hyperlipidemia Mackenzie [name] is not on medications, and she denies chest pain. Last labs in August 2021 were not at goal. She is exercising 3 days per week.  Assessment/Plan:   1. Pre-diabetes Mackenzie [name] will continue her meal plan and medications, and will continue to work on weight loss, exercise, and decreasing simple carbohydrates to help decrease the risk of diabetes. We will recheck her A1c at her next visit.   2. Other hyperlipidemia Cardiovascular risk and specific lipid/LDL goals reviewed.  We discussed several lifestyle modifications today. Mackenzie [name] will continue to work on diet, exercise and weight loss efforts. We will recheck labs at her next visit. Orders and follow up as documented in patient record.   Counseling Intensive lifestyle modifications are the first line treatment for this issue. . Dietary changes: Increase soluble fiber. Decrease simple carbohydrates. . Exercise changes: Moderate to vigorous-intensity aerobic activity 150 minutes per week if  tolerated. . Lipid-lowering medications: see documented in medical record.  3. Class 3 severe obesity with serious comorbidity and body mass index (BMI) of 40.0 to 44.9 in adult, unspecified obesity type (HCC) Mackenzie [name] is currently in the action stage of change. As such, her goal is to continue with weight loss efforts. She has agreed to the Category 3 Plan.   We will recheck fasting labs at her next visit.  Exercise goals: As is.  Behavioral modification strategies: meal planning and cooking strategies and keeping healthy foods in the home.  Mackenzie [name] has agreed to follow-up with our clinic in 2 weeks. She was informed of the importance of frequent follow-up visits to maximize her success with intensive lifestyle modifications for her multiple health conditions.   Objective:   Blood pressure 114/77, pulse 87, temperature 98.1 F (36.7 C), height 5\' 5"  (1.651 m), weight 259 lb (117.5 kg), last menstrual period 02/10/2017, SpO2 97 %, unknown if currently breastfeeding. Body mass index is 43.1 kg/m.  General: Cooperative, alert, well developed, in no acute distress. HEENT: Conjunctivae and lids unremarkable. Cardiovascular: Regular rhythm.  Lungs: Normal work of breathing. Neurologic: No focal deficits.   Lab Results  Component Value Date   CREATININE 0.90 03/30/2020   BUN 18 03/30/2020   NA 134 (L) 03/30/2020   K 3.9 03/30/2020   CL 104 03/30/2020   CO2 26 03/30/2020   Lab Results  Component Value Date   ALT 17 03/30/2020   AST 17 03/30/2020   GGT 22 03/30/2020   ALKPHOS 102 03/30/2020   BILITOT 0.2 (L) 03/30/2020   Lab  Results  Component Value Date   HGBA1C 5.6 01/09/2020   HGBA1C 5.9 (H) 08/09/2019   HGBA1C 5.9 (H) 07/26/2018   Lab Results  Component Value Date   INSULIN 13.3 01/11/2020   INSULIN 12.6 08/09/2019   Lab Results  Component Value Date   TSH 0.657 08/09/2019   Lab Results  Component Value Date   CHOL 210 (H) 01/11/2020   HDL 56 01/11/2020    LDLCALC 143 (H) 01/11/2020   TRIG 64 01/11/2020   CHOLHDL 3.8 01/11/2020   Lab Results  Component Value Date   WBC 3.5 (L) 03/30/2020   HGB 11.7 (L) 03/30/2020   HCT 35.0 (L) 03/30/2020   MCV 87.7 03/30/2020   PLT 233 03/30/2020   No results found for: IRON, TIBC, FERRITIN  Attestation Statements:   Reviewed by clinician on day of visit: allergies, medications, problem list, medical history, surgical history, family history, social history, and previous encounter notes.  Time spent on visit including pre-visit chart review and post-visit care and charting was 31 minutes.    Trude Mcburney, am acting as transcriptionist for [employer], PA-C.  I have reviewed the above documentation for accuracy and completeness, and I agree with the above. Alois Cliche, PA-C

## 2020-06-15 ENCOUNTER — Inpatient Hospital Stay: Payer: 59 | Attending: Adult Health

## 2020-06-15 ENCOUNTER — Other Ambulatory Visit: Payer: Self-pay

## 2020-06-15 ENCOUNTER — Other Ambulatory Visit (INDEPENDENT_AMBULATORY_CARE_PROVIDER_SITE_OTHER): Payer: Self-pay | Admitting: Family Medicine

## 2020-06-15 DIAGNOSIS — Z170 Estrogen receptor positive status [ER+]: Secondary | ICD-10-CM | POA: Insufficient documentation

## 2020-06-15 DIAGNOSIS — R232 Flushing: Secondary | ICD-10-CM | POA: Insufficient documentation

## 2020-06-15 DIAGNOSIS — Z79811 Long term (current) use of aromatase inhibitors: Secondary | ICD-10-CM | POA: Diagnosis not present

## 2020-06-15 DIAGNOSIS — C50212 Malignant neoplasm of upper-inner quadrant of left female breast: Secondary | ICD-10-CM

## 2020-06-15 DIAGNOSIS — C50812 Malignant neoplasm of overlapping sites of left female breast: Secondary | ICD-10-CM | POA: Diagnosis not present

## 2020-06-15 DIAGNOSIS — R7303 Prediabetes: Secondary | ICD-10-CM

## 2020-06-15 DIAGNOSIS — Z9221 Personal history of antineoplastic chemotherapy: Secondary | ICD-10-CM | POA: Diagnosis not present

## 2020-06-15 DIAGNOSIS — M47816 Spondylosis without myelopathy or radiculopathy, lumbar region: Secondary | ICD-10-CM | POA: Diagnosis not present

## 2020-06-15 DIAGNOSIS — Z9012 Acquired absence of left breast and nipple: Secondary | ICD-10-CM | POA: Insufficient documentation

## 2020-06-15 DIAGNOSIS — E559 Vitamin D deficiency, unspecified: Secondary | ICD-10-CM

## 2020-06-15 MED FILL — LETROZOLE 2.5 MG TABS: 2.5 | 90 days supply | Qty: 90 | Fill #1

## 2020-06-16 LAB — FOLLICLE STIMULATING HORMONE: FSH: 6.3 m[IU]/mL

## 2020-06-18 ENCOUNTER — Telehealth: Payer: Self-pay | Admitting: *Deleted

## 2020-06-18 ENCOUNTER — Telehealth: Payer: Self-pay | Admitting: Oncology

## 2020-06-18 NOTE — Telephone Encounter (Signed)
Called pt per 1/7 sch msg - no answer. Left message for patient with appt date and time

## 2020-06-18 NOTE — Telephone Encounter (Signed)
Mackenzie [name]; Called patient last week regarding her upcoming appointments. She requested appointment on 1/17 be changed to 1/18 as she will be out of town on 1/17.  MD appointment rescheduled to 06/27/19 and lab appointment from 1/14 also moved to 1/18 as it is required to be within 3 days of starting next cycle ribociclib. Informed patient to expect to be in clinic at least one hour longer than usual to wait for lab results before we are allowed to dispense next cycle of ribociclib. She verbalized understanding.  Patient also requests for Hgb A1C, Vitamin D and Lipid Panel added as they are due the next day at the weight loss clinic and she does not want to fast twice and get stuck twice on the same day. Request sent to Dr. Jana Hakim.  Foye Spurling, BSN, RN Clinical Research Nurse 06/18/2020 4:18 PM

## 2020-06-20 ENCOUNTER — Other Ambulatory Visit: Payer: Self-pay | Admitting: *Deleted

## 2020-06-20 DIAGNOSIS — E559 Vitamin D deficiency, unspecified: Secondary | ICD-10-CM

## 2020-06-20 DIAGNOSIS — R7303 Prediabetes: Secondary | ICD-10-CM

## 2020-06-20 DIAGNOSIS — E7849 Other hyperlipidemia: Secondary | ICD-10-CM

## 2020-06-22 ENCOUNTER — Other Ambulatory Visit: Payer: 59

## 2020-06-22 LAB — ESTRADIOL, ULTRA SENS: Estradiol, Sensitive: <2.5 pg/mL

## 2020-06-25 ENCOUNTER — Other Ambulatory Visit: Payer: 59

## 2020-06-25 ENCOUNTER — Ambulatory Visit: Payer: 59 | Admitting: Oncology

## 2020-06-25 ENCOUNTER — Ambulatory Visit: Payer: 59

## 2020-06-25 ENCOUNTER — Telehealth: Payer: Self-pay | Admitting: *Deleted

## 2020-06-25 NOTE — Telephone Encounter (Addendum)
Mackenzie [name]; Notified patient of clinic with delayed opening tomorrow due to inclement weather and need to reschedule to after 10 am or to Wednesday.  Patient prefers Wednesday due to work schedule. Appts r/s to Wed 06/28/20 with labs at 7:45 am.  Patient will then go to her weight loss doctor appt at 8:30 am and then return to our clinic for [name] visit with Dr. Jana Hakim at 10:30 am. Reminded patient to fast for at least 8 hours prior to labs and to not take letrozole at home that morning. Also reminded patient to bring in her [name] drugs/bottles and completed diaries. She verbalized understanding.  Foye Spurling, BSN, RN Clinical Research Nurse 06/25/2020 2:06 PM

## 2020-06-25 NOTE — Progress Notes (Incomplete)
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  Her study nurse Cameo was also present during the visit  Mackenzie [name] is on study with [name]-- taking Ribociclb and Letrozole daily.  She is tolerating these medications without any major side effects.  The ribociclib of course is provided by [employer].  The patient is having no diarrhea or rash or other side effects from this that she is aware of.  The letrozole the patient obtains on her own for about $5 a month.  She has occasional hot flashes and mild vaginal dryness but no other significant side effects  She also continues on goserelin every 4 weeks.  Her estradiol was appropriately suppressed when we last checked it 06/15/2020 (less than 2.5).  Her most recent bone density screening on 03/16/2018, showed a T-score of -0.7, which is considered normal.    Her most recent mammography was 10/12/2019, showing density category C with no evidence of malignancy   REVIEW OF SYSTEMS: Mackenzie [name]    COVID 19 VACCINATION STATUS:    HISTORY OF CURRENT ILLNESS: From the original intake note:  The patient herself noted some changes in her left breast late July 2018, she says, and eventually brought this to medical  attention so that on 01/09/2017 she underwent bilateral diagnostic mammography with tomography and left breast ultrasonography at the breast Center. This found the breast density to be category C. In the upper inner quadrant of the left breast there was an area of asymmetry and there were malignant type calcifications involving all 4 quadrants. On exam there is firmness and palpable thickening in the anterior left breast with skin dimpling. Ultrasonography found at the 9:30 o'clock radiant 3 cm from the nipple a 2.7 cm mass and in the left axilla for abnormal lymph nodes largest of which measured 2.7 cm.  Biopsy of the left breast 9:30 o'clock mass 80 01/26/2017 showed ([id number]) invasive ductal carcinoma, with extracellular mucin, grade 1 or 2. In the lower outer left breast is separated biopsy the same day showed ductal carcinoma in situ. One of the 4 lymph nodes involved was positive for metastatic carcinoma. Prognostic panel on the invasive disease showed it to be estrogen receptor 100% positive, progesterone receptor 20% positive, both with strong staining intensity, with an MIB-1 of 20%, and no HER-2 amplification with a signals ratio 1.38 and the number per cell 2.90.  On 01/22/2017 the patient underwent bilateral breast MRI. This showed no involvement of the right breast, but in the left breast there was a masslike and non-masslike enhancement involving all quadrants, with skin swelling but no abnormal enhancement of the skin or nipple areolar complex or pectoralis muscle. The mass could not be clearly measured but spanned approximately 12 cm. There was bulky left axillary lymphadenopathy, with the largest lymph node measuring up to 3.4 cm.  The patient's subsequent history is as  detailed below.   PAST MEDICAL HISTORY: Past Medical History:  Diagnosis Date  . Abnormal glucose 2018  . 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 Allergic rhinitis 2012  . Anemia 01/27/2017   prior to starting  chemotherapy  . Breast cancer ([place]) 01/14/2017   Left breast  . 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 Edema, lower extremity   . Hot flashes 03/2017  . Hyperlipidemia 03/20/2016  . Morbid obesity with body mass index (BMI) of 40.0 to 49.9 ([place]) 09/17/2017  . NCGS (non-celiac gluten sensitivity)   . Pre-diabetes   . Seasonal allergies 2012   seasonal allergies causes allergic rhinitis and itchy, dry eyes per pt  . Vitamin D deficiency 05/2015    PAST SURGICAL HISTORY: Past Surgical History:  Procedure Laterality Date  . BREAST RECONSTRUCTION WITH PLACEMENT OF TISSUE EXPANDER AND FLEX HD (ACELLULAR HYDRATED DERMIS) Left 08/27/2017   Procedure: LEFT BREAST RECONSTRUCTION WITH PLACEMENT OF TISSUE EXPANDER AND FLEX HD;  Surgeon: Wallace Going, DO;  Location: [place];  Service: Plastics;  Laterality: Left;  . CESAREAN SECTION     x2  . LATISSIMUS FLAP TO BREAST Left 11/03/2018  . LATISSIMUS FLAP TO BREAST Left 11/03/2018   Procedure: LATISSIMUS FLAP TO LEFT BREAST;  Surgeon: Wallace Going, DO;  Location: [place];  Service: Plastics;  Laterality: Left;  Marland Kitchen MASTECTOMY MODIFIED RADICAL Left 08/27/2017  . MASTECTOMY MODIFIED RADICAL Left 08/27/2017   Procedure: LEFT MODIFIED RADICAL MASTECTOMY;  Surgeon: Erroll Luna, MD;  Location: [place];  Service: General;  Laterality: Left;  Marland Kitchen MASTOPEXY Right 03/03/2019   Procedure: RIGHT BREAST MASTOPEXY/REDUCTION;  Surgeon: Wallace Going, DO;  Location: [facility];  Service: Plastics;  Laterality: Right;  3 hours, please  . PORT-A-CATH REMOVAL Right 08/27/2017   Procedure: REMOVAL PORT-A-CATH RIGHT CHEST;  Surgeon: Erroll Luna, MD;  Location: [place];  Service: General;  Laterality: Right;  . PORTACATH PLACEMENT Right 01/28/2017   Procedure: INSERTION PORT-A-CATH WITH ULTRASOUND;  Surgeon: Erroll Luna, MD;  Location: [facility];  Service: General;  Laterality: Right;   . REDUCTION MAMMAPLASTY Right   . REMOVAL OF TISSUE EXPANDER AND PLACEMENT OF IMPLANT Left 03/03/2019   Procedure: REMOVAL OF TISSUE EXPANDER AND PLACEMENT OF EXPANDER;  Surgeon: Wallace Going, DO;  Location: [facility] [facility];  Service: Plastics;  Laterality: Left;  . REMOVAL OF TISSUE EXPANDER AND PLACEMENT OF IMPLANT Left 05/25/2019   Procedure: LEFT BREAST REMOVAL OF TISSUE EXPANDER AND PLACEMENT OF IMPLANT;  Surgeon: Wallace Going, DO;  Location: [facility];  Service: Plastics;  Laterality: Left;  . TISSUE EXPANDER PLACEMENT Left 11/03/2018   Procedure: PLACEMENT OF TISSUE EXPANDER LEFT BREAST;  Surgeon: Wallace Going, DO;  Location: [facility];  Service: Plastics;  Laterality: Left;  Total case time is 3.5 hours  . TUBAL LIGATION Bilateral 01/21/2011    FAMILY HISTORY Family History  Problem Relation Age of Onset  . Breast cancer Paternal Grandmother 59       d.60s from breast cancer. Did not have treatment.  . Other Mother        Y.63 from complications of surgery to remove brain tumor  . Diabetes Mother   . Hypertension Mother   . Stroke Mother   . Kidney disease Father   . Hypertension Other   . Diabetes Other   . Stroke Other   The patient's father still alive at age  62. The patient's mother died with a brain tumor which the patient says was "benign". She was 56. The patient has one brother, no sisters. The only breast cancer in the family is a paternal grandmother who died from breast cancer at an unknown age. There is no history of ovarian or prostate cancer in the family.   GYNECOLOGIC HISTORY:  Patient's last menstrual period was 02/10/2017. Menarche age 55 and first live birth age 49 she is [place] P4. The patient is still having regular periods as of August 2018   SOCIAL HISTORY: (Updated 05/31/2018) Mackenzie [name] works at [employer] on the fifth floor, [place]. She is separated from her husband (as of 05/2018), [name], who is a Building control surveyor.  1  daughter is married and lives in [place] and works as a Education administrator. Lakoda's second daughter, [name], lives in [place] and works in the police department. Daughters, [place] and [place] are 49 and 6, living at home. The patient has a grandson she looks after. She attends a [employer].     ADVANCED DIRECTIVES: At the 05/03/2020 visit the patient tells me she has named her daughter [name] as her healthcare power of attorney.  [name] may be reached at [phone]   HEALTH MAINTENANCE: Social History   Tobacco Use  . Smoking status: Never Smoker  . Smokeless tobacco: Never Used  Vaping Use  . Vaping Use: Never used  Substance Use Topics  . Alcohol use: Yes    Comment: social  . Drug use: No    Colonoscopy: Never  PAP: December 2017  Bone density: Never   Allergies  Allergen Reactions  . Dilaudid [Hydromorphone] Itching    Current Outpatient Medications  Medication Sig Dispense Refill  . acetaminophen (TYLENOL) 500 MG tablet Take 1,000 mg by mouth every 6 (six) hours as needed (for pain/headaches.).     Marland Kitchen B Complex Vitamins (VITAMIN B COMPLEX PO) Take 1 capsule by mouth 2 (two) times daily.     . budesonide-formoterol (SYMBICORT) 80-4.5 MCG/ACT inhaler Inhale 2 puffs into the lungs every morning.     . Calcium Carb-Cholecalciferol (CALCIUM 600+D3 PO) Take 1 tablet by mouth 2 (two) times daily.     . cetirizine (ZYRTEC) 10 MG tablet Take 10 mg by mouth daily.    Marland Kitchen gabapentin (NEURONTIN) 300 MG capsule TAKE 1 CAPSULE (300 MG TOTAL) BY MOUTH AT BEDTIME. 90 capsule 4  . hydroxypropyl methylcellulose / hypromellose (ISOPTO TEARS / GONIOVISC) 2.5 % ophthalmic solution Place 1 drop into both eyes daily as needed for dry eyes.     Marland Kitchen ibuprofen (ADVIL,MOTRIN) 200 MG tablet Take 800 mg by mouth every 8 (eight) hours as needed for mild pain (for pain.).     . Investigational ribociclib (KISQALI) 200 MG tablet [name] Take 2 tablets (400 mg total) by mouth  daily. Take 2 tablets ($RemoveBe'400mg'dqpdhZgzF$  total) by mouth daily on days 1-21.  Repeat every 28 days. 225 tablet 0  . letrozole (FEMARA) 2.5 MG tablet TAKE 1 TABLET BY MOUTH DAILY. 90 tablet 4  . loratadine (CLARITIN) 10 MG tablet Take 1 tablet (10 mg total) by mouth daily. (Patient taking differently: Take 10 mg by mouth daily as needed for allergies.) 90 tablet 2  . metFORMIN (GLUCOPHAGE) 500 MG tablet Take 1 tablet (500 mg total) by mouth 2 (two) times daily with a meal. 60 tablet 0  . Multiple Vitamin (MULTIVITAMIN WITH MINERALS) TABS tablet Take 1 tablet by mouth daily.     . Omega-3 Fatty Acids (FISH  OIL) 1000 MG CAPS Take 1,000 mg by mouth 2 (two) times daily.     . phenylephrine (SUDAFED PE) 10 MG TABS tablet Take 10 mg by mouth every 4 (four) hours as needed.     . Probiotic Product (PROBIOTIC DAILY PO) Take 1 capsule by mouth 2 (two) times daily.     . Semaglutide, 1 MG/DOSE, (OZEMPIC, 1 MG/DOSE,) 4 MG/3ML SOPN Inject 1 mg weekly    . Semaglutide,0.25 or 0.5MG /DOS, 2 MG/1.5ML SOPN 1mg  wkly. 3 mL 0  . Turmeric 450 MG CAPS Take 450 mg by mouth 2 (two) times daily.    . Vitamin D, Ergocalciferol, (DRISDOL) 1.25 MG (50000 UNIT) CAPS capsule 1 po q 10d 3 capsule 0  . ZINC OXIDE PO Take 1 tablet by mouth 2 (two) times a day.     No current facility-administered medications for this visit.   Facility-Administered Medications Ordered in Other Visits  Medication Dose Route Frequency Provider Last Rate Last Admin  . sodium chloride flush (NS) 0.9 % injection 10 mL  10 mL Intravenous PRN Magrinat, [name], MD   10 mL at 03/10/17 1239    OBJECTIVE: African-American woman in no acute distress There were no vitals filed for this visit. Wt Readings from Last 3 Encounters:  06/13/20 259 lb (117.5 kg)  05/30/20 258 lb (117 kg)  05/16/20 260 lb (117.9 kg)   There is no height or weight on file to calculate BMI.   ECOG FS: 1  Sclerae unicteric, EOMs intact Wearing a mask No cervical or supraclavicular  adenopathy Lungs no rales or rhonchi Heart regular rate and rhythm Abd soft, nontender, positive bowel sounds MSK no focal spinal tenderness, no upper extremity lymphedema Neuro: nonfocal, well oriented, appropriate affect Breasts:    {Sclerae unicteric, EOMs intact Wearing a mask No cervical or supraclavicular adenopathy Lungs no rales or rhonchi Heart regular rate and rhythm Abd soft, nontender, positive bowel sounds MSK no focal spinal tenderness, no upper extremity lymphedema Neuro: nonfocal, well oriented, appropriate affect Breasts: The right breast is status post reduction mammoplasty.  It is otherwise unremarkable.  The left breast is status post mastectomy and reconstruction.  There is no evidence of local recurrence.  Both axillae are benign.}   LAB RESULTS:  No visits with results within 3 Day(s) from this visit.  Latest known visit with results is:  Appointment on 06/15/2020  Component Date Value Ref Range Status  . [facility] 06/15/2020 6.3  mIU/mL Final   Comment: (NOTE)                    Adult Female:                      Follicular phase      3.5 -  12.5                      Ovulation phase       4.7 -  21.5                      Luteal phase          1.7 -   7.7                      Postmenopausal       25.8 - 134.8 Performed At: [facility] [place], [place] [id number] Rush Farmer MD PX:[id number]   .  Estradiol, Sensitive 06/15/2020 <2.5  pg/mL Final   Comment: (NOTE)           Female:            Follicular:                    30.0 - 100.0            Luteal:                        70.0 - 300.0            Postmenopausal:                      < 15.0 This test was developed and its performance characteristics determined by [facility]. It has not been cleared by the Food and Drug Administration. Methodology: Liquid chromatography tandem mass spectrometry(LC/MS/MS) Performed At: [facility] [employer] [place], [place]  [id number] Rush Farmer MD [id number]      STUDIES: No results found.   ELIGIBLE FOR AVAILABLE RESEARCH PROTOCOL: [name], ASA study   ASSESSMENT: 50 y.o. [place] woman status post left breast upper inner quadrant biopsy 01/14/2017 for a clinical T3 N2, stage IIA invasive ductal carcinoma, grade 1 or 2, estrogen and progesterone receptor positive, HER-2 nonamplified, with an MIB-1 of 20%.  (1) staging studies: Brain MRI, bone scan, and CT scan of the chest 02/05/2017 showed no brain lesions, no lung or liver lesions, a 4.9 cm mass in the left breast with left axillary and subpectoral adenopathy, and nonspecific bone scan tracer at L2, left scapula, and anterior ribs, with lumbar spine MRI suggested for further evaluation.  (a) lumbar spine MRI 02/17/2017 showed no normal bone lesions.  There was mild lumbar spondylosis  (2) neoadjuvant chemotherapy consisting of cyclophosphamide and doxorubicin in dose dense fashion 4 started 02/10/2017, completed 03/24/2017, followed by weekly carboplatin and gemcitabine given days 1 and 8 of each 21-day cycle starting 04/14/2017, completing the planned 4 cycles 06/26/2017  (3) is post left modified radical mastectomy on 08/27/2017 showing an mpT3 pN2 residual invasive ductal carcinoma, grade 2, with a residual cancer burden of 3.  Margins were clear  (a) latissimus flap reconstruction with expander 11/03/2018  (4) postmastectomy radiation completed 01/01/2018  (a) capecitabine radiosensitization 11/16/2017-01/01/2018  (5) goserelin started 09/21/2017  (a) letrozole started 01/11/2018  (b) enrolled in [facility] clinical trial, randomized to ribociclib on 05/31/2018  (c) Bone density on 03/16/2018 was normal with T score of -0.7 in the L1-L4 spine  (6) genetics testing 03/24/2017 through the Common Hereditary Cancer Panel offered by [facility] found no deleterious mutations in APC, ATM, AXIN2, BARD1, BMPR1A, BRCA1, BRCA2, BRIP1, CDH1, CDKN2A (p14ARF),  CDKN2A (p16INK4a), CHEK2, CTNNA1, DICER1, EPCAM (Deletion/duplication testing only), GREM1 (promoter region deletion/duplication testing only), KIT, MEN1, MLH1, MSH2, MSH3, MSH6, MUTYH, NBN, NF1, NHTL1, PALB2, PDGFRA, PMS2, POLD1, POLE, PTEN, RAD50, RAD51C, RAD51D, SDHB, SDHC, SDHD, SMAD4, SMARCA4. STK11, TP53, TSC1, TSC2, and VHL.  The following genes were evaluated for sequence changes only: SDHA and HOXB13 c.251G>A variant only.    (7) Right breast reduction and left breast expander placement on 03/03/2019  (a) Implant exchanged on 05/25/2019 or a [employer] Smooth Round Ultra High Profile Gel 750cc. Ref #[id number].  Serial Number [id number]   PLAN: Ambriella is now a little over 2-1/2 years out from definitive surgery for her breast cancer with no evidence of disease recurrence.  This is very favorable.  She is  doing very well on the ribociclib trial.  This medication will continue for a total of 3 years.  She is on letrozole under Zoladex coverage.  She is 69 and it is possible that she is already postmenopausal.  Either way to find out of course would be to stop the goserelin and follow [place] and estradiol on a monthly basis.  If there is any evidence of the ovaries functioning we would resume Zoladex.  We are inquiring of the study if this can be tried.  Otherwise she will continue on Zoladex as at present.  She tells me she is comfortable with either plan.  Today we reviewed healthcare power of attorney's issues and she tells me she has named her daughter [name] as her healthcare power of attorney.  I do not have a simple explanation for the heaviness of the left leg.  It is not constant, it is not progressive.  I suggested she do a little bit of warm up and stretching before her walks and perhaps that will help.  I did offer physical therapy but she did not want to pursue that at this point.  At any rate I do not believe this symptom has anything to do with any of her medications.  She will see me  again in 3 months.  She knows to call for any other issue that may develop before the next visit  Total encounter time 30 minutes.Sarajane Jews C. Magrinat, MD  06/25/20 11:48 AM Medical Oncology and Hematology [facility] [place], [place] [address] Tel. [phone]    Fax. [phone]   I, Wilburn Mylar, am acting as scribe for Dr. [name]. Magrinat.  I, Lurline Del MD, have reviewed the above documentation for accuracy and completeness, and I agree with the above.   *Total Encounter Time as defined by the Centers for Medicare and Medicaid Services includes, in addition to the face-to-face time of a patient visit (documented in the note above) non-face-to-face time: obtaining and reviewing outside history, ordering and reviewing medications, tests or procedures, care coordination (communications with other health care professionals or caregivers) and documentation in the medical record.

## 2020-06-26 ENCOUNTER — Encounter (INDEPENDENT_AMBULATORY_CARE_PROVIDER_SITE_OTHER): Payer: Self-pay

## 2020-06-26 ENCOUNTER — Ambulatory Visit: Payer: 59

## 2020-06-26 ENCOUNTER — Other Ambulatory Visit: Payer: 59

## 2020-06-26 ENCOUNTER — Ambulatory Visit: Payer: 59 | Admitting: Oncology

## 2020-06-26 NOTE — Progress Notes (Signed)
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  Her study nurses were also present during the visit  Mackenzie [name] is on study with [name]-- taking Ribociclb and Letrozole daily.  She is tolerating these medications without any major side effects.  She is having a little bit of nausea in the mornings, sometimes relieved by eating sometimes not.  I think this is unlikely to be due to the medication: Its only been present a couple of weeks.  There has been no vomiting and no change in bowel habits related to this  The letrozole the patient obtains on her own for about $5 a month.  She is experiencing hot flashes sometimes during the day and sometimes at night.  She had been on goserelin for ovarian suppression but given her age of 50 we thought she might actually already be postmenopausal so her last goserelin dose was 04/02/2020.  Her estradiol was still suppressed when we last checked it 06/15/2020 (less than 2.5).  However her Mackenzie [name] was still low at 6.3 indicating we are still seeing residual pituitary suppression from her goserelin.  Aside from the hot flashes she is having vaginal dryness symptoms.  Her most recent bone density screening on 03/16/2018, showed a  T-score of -0.7, which is considered normal.    Her most recent mammography was 10/12/2019, showing density category C with no evidence of malignancy   REVIEW OF SYSTEMS: Mackenzie [name] is still pretty busy at work.  She enjoyed the holidays.  She went to [place] for Thanksgiving's but stayed around this area for the end of the year and Christmas.  She is doing Zumba for exercise.  A detailed review of systems was otherwise noncontributory   COVID 19 VACCINATION STATUS: Status post Pfizer x2 with booster September 2021   HISTORY OF CURRENT ILLNESS: From the original intake note:  The patient herself noted some changes in her left breast late July 2018, she says, and eventually brought this to medical attention so that on 01/09/2017 she underwent bilateral diagnostic mammography with tomography and left breast ultrasonography at the breast Center. This found the breast density to be category C. In the upper inner quadrant of the left breast there was an area of asymmetry and there were malignant type calcifications involving all 4 quadrants. On exam there is firmness and palpable thickening in the anterior left breast with skin dimpling. Ultrasonography found at the 9:30 o'clock radiant 3 cm from the nipple a 2.7 cm mass and in the left axilla for abnormal lymph nodes largest of which measured 2.7 cm.  Biopsy of the left breast 9:30 o'clock mass 80 01/26/2017 showed ([id number]) invasive ductal carcinoma, with extracellular mucin, grade 1 or 2. In the lower outer left breast is separated biopsy the same day showed ductal carcinoma in situ. One of the 4 lymph nodes involved was positive for metastatic carcinoma.  Prognostic panel on the invasive disease showed it to be estrogen receptor 100% positive, progesterone receptor 20% positive, both with strong staining intensity, with an MIB-1 of 20%, and no HER-2 amplification with a signals ratio 1.38 and the number per cell 2.90.  On 01/22/2017 the patient  underwent bilateral breast MRI. This showed no involvement of the right breast, but in the left breast there was a masslike and non-masslike enhancement involving all quadrants, with skin swelling but no abnormal enhancement of the skin or nipple areolar complex or pectoralis muscle. The mass could not be clearly measured but spanned approximately 12 cm. There was bulky left axillary lymphadenopathy, with the largest lymph node measuring up to 3.4 cm.  The patient's subsequent history is as detailed below.   PAST MEDICAL HISTORY: Past Medical History:  Diagnosis Date  . Abnormal glucose 2018  . 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 Allergic rhinitis 2012  . Anemia 01/27/2017   prior to starting chemotherapy  . Breast cancer ([facility]) 01/14/2017   Left breast  . 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 Edema, lower extremity   . Hot flashes 03/2017  . Hyperlipidemia 03/20/2016  . Morbid obesity with body mass index (BMI) of 40.0 to 49.9 ([facility]) 09/17/2017  . NCGS (non-celiac gluten sensitivity)   . Pre-diabetes   . Seasonal allergies 2012   seasonal allergies causes allergic rhinitis and itchy, dry eyes per pt  . Vitamin D deficiency 05/2015    PAST SURGICAL HISTORY: Past Surgical History:  Procedure Laterality Date  . BREAST RECONSTRUCTION WITH PLACEMENT OF TISSUE EXPANDER AND FLEX HD (ACELLULAR HYDRATED DERMIS) Left 08/27/2017   Procedure: LEFT BREAST RECONSTRUCTION WITH PLACEMENT OF TISSUE EXPANDER AND FLEX HD;  Surgeon: Wallace Going, DO;  Location: [facility];  Service: Plastics;  Laterality: Left;  . CESAREAN SECTION     x2  . LATISSIMUS FLAP TO BREAST Left 11/03/2018  . LATISSIMUS FLAP TO BREAST Left 11/03/2018   Procedure: LATISSIMUS FLAP TO LEFT BREAST;  Surgeon: Wallace Going, DO;  Location: [facility];  Service: Plastics;  Laterality: Left;  Marland Kitchen MASTECTOMY MODIFIED RADICAL Left 08/27/2017  . MASTECTOMY MODIFIED RADICAL Left  08/27/2017   Procedure: LEFT MODIFIED RADICAL MASTECTOMY;  Surgeon: Erroll Luna, MD;  Location: [facility];  Service: General;  Laterality: Left;  Marland Kitchen MASTOPEXY Right 03/03/2019   Procedure: RIGHT BREAST MASTOPEXY/REDUCTION;  Surgeon: Wallace Going, DO;  Location: [facility];  Service: Plastics;  Laterality: Right;  3 hours, please  . PORT-A-CATH REMOVAL Right 08/27/2017   Procedure: REMOVAL PORT-A-CATH RIGHT CHEST;  Surgeon: Erroll Luna, MD;  Location: [facility];  Service: General;  Laterality: Right;  . PORTACATH PLACEMENT Right 01/28/2017   Procedure: INSERTION PORT-A-CATH WITH ULTRASOUND;  Surgeon: Erroll Luna, MD;  Location: [facility];  Service: General;  Laterality: Right;  . REDUCTION MAMMAPLASTY Right   . REMOVAL OF TISSUE EXPANDER AND PLACEMENT OF IMPLANT Left 03/03/2019   Procedure: REMOVAL OF TISSUE EXPANDER AND PLACEMENT OF EXPANDER;  Surgeon: Wallace Going, DO;  Location: [facility];  Service: Plastics;  Laterality: Left;  . REMOVAL OF TISSUE EXPANDER AND PLACEMENT OF IMPLANT Left 05/25/2019   Procedure: LEFT BREAST REMOVAL OF TISSUE EXPANDER AND PLACEMENT OF IMPLANT;  Surgeon: Wallace Going, DO;  Location: [facility];  Service: Plastics;  Laterality: Left;  . TISSUE EXPANDER PLACEMENT Left 11/03/2018   Procedure: PLACEMENT OF TISSUE EXPANDER LEFT BREAST;  Surgeon: Wallace Going, DO;  Location: [facility];  Service: Plastics;  Laterality: Left;  Total case time is 3.5 hours  . TUBAL LIGATION Bilateral 01/21/2011    FAMILY HISTORY Family History  Problem Relation Age of Onset  . Breast cancer Paternal Grandmother 85       d.60s from breast cancer. Did not have treatment.  . Other Mother        L.89 from complications of surgery to remove brain tumor  . Diabetes Mother   . Hypertension Mother   . Stroke Mother   . Kidney disease Father   . Hypertension Other   . Diabetes Other   . Stroke Other     The patient's father still alive at age 62. The patient's mother died with a brain tumor which the patient says was "benign". She was 56. The patient has one brother, no sisters. The only breast cancer in the family is a paternal grandmother who died from breast cancer at an unknown age. There is no history of ovarian or prostate cancer in the family.   GYNECOLOGIC HISTORY:  Patient's last menstrual period was 02/10/2017. Menarche age 66 and first live birth age 68 she is [name] P4. The patient is still having regular periods as of August 2018   SOCIAL HISTORY: (Updated 05/31/2018) Terrin works at [employer] on the fifth floor, [place]. She is separated from her husband (as of 05/2018), [name], who is a Building control surveyor.  1 daughter is married and lives in [place] and works as a Education administrator. Mackenzie [name]'s second daughter, Mackenzie [name], lives in [place] and works in the police department. Daughters, Mackenzie [name] and Mackenzie [name] are 65 and 6, living at home. The patient has a grandson she looks after. She attends a [employer].     ADVANCED DIRECTIVES: At the 05/03/2020 visit the patient tells me she has named her daughter Mackenzie [name] as her healthcare power of attorney.  Mackenzie [name] may be reached at [phone]   HEALTH MAINTENANCE: Social History   Tobacco Use  . Smoking status: Never Smoker  . Smokeless tobacco: Never Used  Vaping Use  . Vaping Use: Never used  Substance Use Topics  . Alcohol use: Yes    Comment: social  . Drug use: No    Colonoscopy: Never  PAP: December 2017  Bone density: Never   Allergies  Allergen Reactions  . Dilaudid [Hydromorphone] Itching    Current Outpatient Medications  Medication Sig Dispense Refill  . acetaminophen (TYLENOL) 500 MG tablet Take 1,000 mg by mouth every 6 (six) hours as needed (for pain/headaches.).     Marland Kitchen B Complex Vitamins (VITAMIN B COMPLEX PO) Take 1 capsule by mouth 2 (two) times daily.     . budesonide-formoterol (SYMBICORT) 80-4.5  MCG/ACT inhaler Inhale 2 puffs into the lungs every morning.     . Calcium Carb-Cholecalciferol (CALCIUM 600+D3 PO) Take 1 tablet by mouth 2 (two) times daily.     . cetirizine (ZYRTEC) 10 MG tablet Take 10 mg by mouth daily.    Marland Kitchen gabapentin (NEURONTIN) 300 MG capsule TAKE 1 CAPSULE (300 MG TOTAL) BY MOUTH AT BEDTIME. 90 capsule 4  . hydroxypropyl methylcellulose / hypromellose (ISOPTO TEARS / GONIOVISC) 2.5 % ophthalmic solution Place 1 drop into both eyes daily as needed for dry eyes.     Marland Kitchen ibuprofen (ADVIL,MOTRIN) 200 MG tablet Take 800 mg by mouth every 8 (eight) hours as needed for mild pain (for pain.).     . Investigational ribociclib (KISQALI) 200 MG tablet  [name] Take 2 tablets (400 mg total) by mouth daily. Take 2 tablets ($RemoveBe'400mg'LzHTSrfWO$  total) by mouth daily on days 1-21.  Repeat every 28 days. 225 tablet 0  . letrozole (FEMARA) 2.5 MG tablet TAKE 1 TABLET BY MOUTH DAILY. 90 tablet 4  . loratadine (CLARITIN) 10 MG tablet Take 1 tablet (10 mg total) by mouth daily. (Patient taking differently: Take 10 mg by mouth daily as needed for allergies.) 90 tablet 2  . metFORMIN (GLUCOPHAGE) 500 MG tablet Take 1 tablet (500 mg total) by mouth 2 (two) times daily with a meal. 60 tablet 0  . Multiple Vitamin (MULTIVITAMIN WITH MINERALS) TABS tablet Take 1 tablet by mouth daily.     . Omega-3 Fatty Acids (FISH OIL) 1000 MG CAPS Take 1,000 mg by mouth 2 (two) times daily.     . phenylephrine (SUDAFED PE) 10 MG TABS tablet Take 10 mg by mouth every 4 (four) hours as needed.     . Probiotic Product (PROBIOTIC DAILY PO) Take 1 capsule by mouth 2 (two) times daily.     . Semaglutide, 1 MG/DOSE, (OZEMPIC, 1 MG/DOSE,) 4 MG/3ML SOPN Inject 1 mg weekly    . Turmeric 450 MG CAPS Take 450 mg by mouth 2 (two) times daily.    . Vitamin D, Ergocalciferol, (DRISDOL) 1.25 MG (50000 UNIT) CAPS capsule 1 po q 10d 3 capsule 0  . ZINC OXIDE PO Take 1 tablet by mouth 2 (two) times a day.     No current  facility-administered medications for this visit.   Facility-Administered Medications Ordered in Other Visits  Medication Dose Route Frequency Provider Last Rate Last Admin  . sodium chloride flush (NS) 0.9 % injection 10 mL  10 mL Intravenous PRN Chelbie Jarnagin, [name], MD   10 mL at 03/10/17 1239    OBJECTIVE: African-American woman who appears well Vitals:   06/27/20 0912  BP: 133/74  Pulse: 78  Resp: 20  Temp: (!) 97.2 F (36.2 C)  SpO2: 99%   Wt Readings from Last 3 Encounters:  06/27/20 267 lb 4.8 oz (121.2 kg)  06/13/20 259 lb (117.5 kg)  05/30/20 258 lb (117 kg)   Body mass index is 44.48 kg/m.   ECOG FS: 1  Sclerae unicteric, EOMs intact Wearing a mask No cervical or supraclavicular adenopathy Lungs no rales or rhonchi Heart regular rate and rhythm Abd soft, nontender, positive bowel sounds MSK no focal spinal tenderness, no upper extremity lymphedema Neuro: nonfocal, well oriented, appropriate affect Breasts: The right breast is status post reduction mammoplasty.  It is otherwise unremarkable.  The left breast is status post mastectomy with latissimus flap reconstruction.  There is significant asymmetry and some tightness.  There is no evidence of local recurrence.  Both axillae are benign per   LAB RESULTS:  Appointment on 06/27/2020  Component Date Value Ref Range Status  . Cholesterol 06/27/2020 213* 0 - 200 mg/dL Final  . Triglycerides 06/27/2020 33  <150 mg/dL Final  . HDL 06/27/2020 62  >40 mg/dL Final  . Total CHOL/HDL Ratio 06/27/2020 3.4  RATIO Final  . VLDL 06/27/2020 7  0 - 40 mg/dL Final  . LDL Cholesterol 06/27/2020 144* 0 - 99 mg/dL Final   Comment:        Total Cholesterol/HDL:CHD Risk Coronary Heart Disease Risk Table                     Men   Women  1/2 Average Risk   3.4  3.3  Average Risk       5.0   4.4  2 X Average Risk   9.6   7.1  3 X Average Risk  23.4   11.0        Use the calculated Patient Ratio above and the CHD Risk  Table to determine the patient's CHD Risk.        ATP III CLASSIFICATION (LDL):  <100     mg/dL   Optimal  100-129  mg/dL   Near or Above                    Optimal  130-159  mg/dL   Borderline  160-189  mg/dL   High  >190     mg/dL   Very High Performed at [facility] [address] [place], [place] [address]   . Uric Acid, Serum 06/27/2020 3.5  2.5 - 7.1 mg/dL Final   Performed at [facility], [place] [address], [place], [place] [address]  . Phosphorus 06/27/2020 4.2  2.5 - 4.6 mg/dL Final   Performed at [facility] [address], [place], [place] [address]  . Magnesium 06/27/2020 1.8  1.7 - 2.4 mg/dL Final   Performed at [facility], [place] [address] [place], [place] [address]  . Lipase 06/27/2020 29  11 - 51 U/L Final   Performed at [facility], [place] [address] [place], [place] [address]  . LDH 06/27/2020 183  98 - 192 U/L Final   Performed at [facility], [place] [address] [place], [place] [address]  . GGT 06/27/2020 22  7 - 50 U/L Final   Performed at [facility], [place] [address] [place], [place] [address]  . Sodium 06/27/2020 133* 135 - 145 mmol/L Final  . Potassium 06/27/2020 3.9  3.5 - 5.1 mmol/L Final  . Chloride 06/27/2020 100  98 - 111 mmol/L Final  . CO2 06/27/2020 26  22 - 32 mmol/L Final  . Glucose, Bld 06/27/2020 84  70 - 99 mg/dL Final   Glucose reference range applies only to samples taken after fasting for at least 8 hours.  . BUN 06/27/2020 14  6 - 20 mg/dL Final  . Creatinine 06/27/2020 0.95  0.44 - 1.00 mg/dL Final  . Calcium 06/27/2020 9.4  8.9 - 10.3 mg/dL Final  . Total Protein 06/27/2020 7.9  6.5 - 8.1 g/dL Final  . Albumin 06/27/2020 3.8  3.5 - 5.0 g/dL Final  . AST 06/27/2020 25  15 - 41 U/L Final  . ALT 06/27/2020 27  0 - 44 U/L Final  . Alkaline Phosphatase 06/27/2020 99  38 - 126 U/L Final  .  Total Bilirubin 06/27/2020 0.4  0.3 - 1.2 mg/dL Final  . GFR, Estimated 06/27/2020 >60  >60 mL/min Final   Comment: (NOTE) Calculated using the CKD-EPI Creatinine Equation (2021)   . Anion gap 06/27/2020 7  5 - 15 Final   Performed at [facility], [place] [address], [place],  [address]  . WBC Count 06/27/2020 3.7* 4.0 - 10.5 K/uL Final  . RBC 06/27/2020 4.20  3.87 - 5.11 MIL/uL Final  . Hemoglobin 06/27/2020 12.2  12.0 - 15.0 g/dL Final  . HCT 06/27/2020 36.6  36.0 - 46.0 % Final  . MCV 06/27/2020 87.1  80.0 - 100.0 fL Final  . MCH 06/27/2020 29.0  26.0 - 34.0 pg Final  .  MCHC 06/27/2020 33.3  30.0 - 36.0 g/dL Final  . RDW 06/27/2020 14.1  11.5 - 15.5 % Final  . Platelet Count 06/27/2020 213  150 - 400 K/uL Final  . nRBC 06/27/2020 0.0  0.0 - 0.2 % Final  . Neutrophils Relative % 06/27/2020 58  % Final  . Neutro Abs 06/27/2020 2.1  1.7 - 7.7 K/uL Final  . Lymphocytes Relative 06/27/2020 24  % Final  . Lymphs Abs 06/27/2020 0.9  0.7 - 4.0 K/uL Final  . Monocytes Relative 06/27/2020 15  % Final  . Monocytes Absolute 06/27/2020 0.6  0.1 - 1.0 K/uL Final  . Eosinophils Relative 06/27/2020 1  % Final  . Eosinophils Absolute 06/27/2020 0.0  0.0 - 0.5 K/uL Final  . Basophils Relative 06/27/2020 1  % Final  . Basophils Absolute 06/27/2020 0.1  0.0 - 0.1 K/uL Final  . Immature Granulocytes 06/27/2020 1  % Final  . Abs Immature Granulocytes 06/27/2020 0.02  0.00 - 0.07 K/uL Final   Performed at [facility], [place] [address], [place], [place] [address]  . Bilirubin, Direct 06/27/2020 0.1  0.0 - 0.2 mg/dL Final   Performed at [facility] [address], [place], [place] [address]  . Amylase 06/27/2020 84  28 - 100 U/L Final   Performed at [facility], [place] [address] [place], [place] [address]     STUDIES: No results found.   ELIGIBLE FOR AVAILABLE RESEARCH PROTOCOL: [name], ASA  study   ASSESSMENT: 50 y.o. [place] woman status post left breast upper inner quadrant biopsy 01/14/2017 for a clinical T3 N2, stage IIA invasive ductal carcinoma, grade 1 or 2, estrogen and progesterone receptor positive, HER-2 nonamplified, with an MIB-1 of 20%.  (1) staging studies: Brain MRI, bone scan, and CT scan of the chest 02/05/2017 showed no brain lesions, no lung or liver lesions, a 4.9 cm mass in the left breast with left axillary and subpectoral adenopathy, and nonspecific bone scan tracer at L2, left scapula, and anterior ribs, with lumbar spine MRI suggested for further evaluation.  (a) lumbar spine MRI 02/17/2017 showed no normal bone lesions.  There was mild lumbar spondylosis  (2) neoadjuvant chemotherapy consisting of cyclophosphamide and doxorubicin in dose dense fashion 4 started 02/10/2017, completed 03/24/2017, followed by weekly carboplatin and gemcitabine given days 1 and 8 of each 21-day cycle starting 04/14/2017, completing the planned 4 cycles 06/26/2017  (3) is post left modified radical mastectomy on 08/27/2017 showing an mpT3 pN2 residual invasive ductal carcinoma, grade 2, with a residual cancer burden of 3.  Margins were clear  (a) latissimus flap reconstruction with expander 11/03/2018  (4) postmastectomy radiation completed 01/01/2018  (a) capecitabine radiosensitization 11/16/2017-01/01/2018  (5) goserelin started 09/21/2017  (a) letrozole started 01/11/2018  (b) enrolled in [facility] clinical trial, randomized to ribociclib on 05/31/2018  (c) Bone density on 03/16/2018 was normal with T score of -0.7 in the L1-L4 spine  (6) genetics testing 03/24/2017 through the Common Hereditary Cancer Panel offered by [facility] found no deleterious mutations in APC, ATM, AXIN2, BARD1, BMPR1A, BRCA1, BRCA2, BRIP1, CDH1, CDKN2A (p14ARF), CDKN2A (p16INK4a), CHEK2, CTNNA1, DICER1, EPCAM (Deletion/duplication testing only), GREM1 (promoter region deletion/duplication testing  only), KIT, MEN1, MLH1, MSH2, MSH3, MSH6, MUTYH, NBN, NF1, NHTL1, PALB2, PDGFRA, PMS2, POLD1, POLE, PTEN, RAD50, RAD51C, RAD51D, SDHB, SDHC, SDHD, SMAD4, SMARCA4. STK11, TP53, TSC1, TSC2, and VHL.  The following genes were evaluated for sequence changes only: SDHA and HOXB13 c.251G>A variant only.    (  7) Right breast reduction and left breast expander placement on 03/03/2019 with latissimus flap placement  (a) left implant implant exchanged on 05/25/2019 or a [place] Smooth Round Ultra High Profile Gel 750cc. Ref #[id number].  Serial Number [id number]   PLAN: Belicia is now close to 3 years out from definitive surgery for her breast cancer with no evidence of disease recurrence.  This is very favorable.  She is tolerating the letrozole and ribociclib without any significant side effects and we are continuing as per the [name] trial.  Her Mackenzie [name] has not yet risen so while the estradiol remains low we still do not know if the ovaries will "wake up" or not.  Certainly she does not need goserelin at present since her estradiol continues to be suppressed.  She is having hot flashes and vaginal dryness.  I think in the next 3 to 6 months we will know definitively as the [facility] rises whether the ovaries will respond or not.  She is aware of the need to call us if she develops any spotting or other menstrual bleeding   Her LDL is 144.  Even if she keeps a perfect diet she would not be able to bring it under 100 so I think she might consider a statin.  She will discuss this with her primary care physician.  She will see [name] again in 3 months.  At that time I will set her up for her next mammogram  Total encounter time 30 minutes.*  Total encounter time 30 minutes.Sarajane Jews C. Briony Parveen, MD  06/27/20 9:51 AM Medical Oncology and Hematology [facility] [place], [place] [address] Tel. [phone]    Fax. [phone]   I, Wilburn Mylar, am acting as scribe for Dr. [name].  Mackenzie [name].  I, Lurline Del MD, have reviewed the above documentation for accuracy and completeness, and I agree with the above.   *Total Encounter Time as defined by the Centers for Medicare and Medicaid Services includes, in addition to the face-to-face time of a patient visit (documented in the note above) non-face-to-face time: obtaining and reviewing outside history, ordering and reviewing medications, tests or procedures, care coordination (communications with other health care professionals or caregivers) and documentation in the medical record.

## 2020-06-27 ENCOUNTER — Encounter: Payer: Self-pay | Admitting: *Deleted

## 2020-06-27 ENCOUNTER — Inpatient Hospital Stay: Payer: 59 | Admitting: Oncology

## 2020-06-27 ENCOUNTER — Other Ambulatory Visit: Payer: Self-pay

## 2020-06-27 ENCOUNTER — Other Ambulatory Visit (INDEPENDENT_AMBULATORY_CARE_PROVIDER_SITE_OTHER): Payer: Self-pay | Admitting: Physician Assistant

## 2020-06-27 ENCOUNTER — Inpatient Hospital Stay: Payer: 59

## 2020-06-27 ENCOUNTER — Telehealth (INDEPENDENT_AMBULATORY_CARE_PROVIDER_SITE_OTHER): Payer: 59 | Admitting: Physician Assistant

## 2020-06-27 VITALS — BP 133/74 | HR 78 | Temp 97.2°F | Resp 20 | Ht 65.0 in | Wt 267.3 lb

## 2020-06-27 DIAGNOSIS — Z170 Estrogen receptor positive status [ER+]: Secondary | ICD-10-CM | POA: Diagnosis not present

## 2020-06-27 DIAGNOSIS — Z9221 Personal history of antineoplastic chemotherapy: Secondary | ICD-10-CM | POA: Diagnosis not present

## 2020-06-27 DIAGNOSIS — R232 Flushing: Secondary | ICD-10-CM | POA: Diagnosis not present

## 2020-06-27 DIAGNOSIS — C50812 Malignant neoplasm of overlapping sites of left female breast: Secondary | ICD-10-CM

## 2020-06-27 DIAGNOSIS — R7303 Prediabetes: Secondary | ICD-10-CM | POA: Diagnosis not present

## 2020-06-27 DIAGNOSIS — Z9012 Acquired absence of left breast and nipple: Secondary | ICD-10-CM | POA: Diagnosis not present

## 2020-06-27 DIAGNOSIS — C50912 Malignant neoplasm of unspecified site of left female breast: Secondary | ICD-10-CM

## 2020-06-27 DIAGNOSIS — E559 Vitamin D deficiency, unspecified: Secondary | ICD-10-CM | POA: Diagnosis not present

## 2020-06-27 DIAGNOSIS — Z6841 Body Mass Index (BMI) 40.0 and over, adult: Secondary | ICD-10-CM

## 2020-06-27 DIAGNOSIS — C50212 Malignant neoplasm of upper-inner quadrant of left female breast: Secondary | ICD-10-CM

## 2020-06-27 DIAGNOSIS — E7849 Other hyperlipidemia: Secondary | ICD-10-CM

## 2020-06-27 DIAGNOSIS — M47816 Spondylosis without myelopathy or radiculopathy, lumbar region: Secondary | ICD-10-CM | POA: Diagnosis not present

## 2020-06-27 DIAGNOSIS — Z79811 Long term (current) use of aromatase inhibitors: Secondary | ICD-10-CM | POA: Diagnosis not present

## 2020-06-27 DIAGNOSIS — Z006 Encounter for examination for normal comparison and control in clinical research program: Secondary | ICD-10-CM

## 2020-06-27 DIAGNOSIS — C773 Secondary and unspecified malignant neoplasm of axilla and upper limb lymph nodes: Secondary | ICD-10-CM

## 2020-06-27 LAB — LIPID PANEL
Cholesterol: 213 mg/dL — ABNORMAL HIGH (ref 0–200)
HDL: 62 mg/dL (ref >40)
LDL Cholesterol: 144 mg/dL — ABNORMAL HIGH (ref 0–99)
Total CHOL/HDL Ratio: 3.4 RATIO
Triglycerides: 33 mg/dL (ref <150)
VLDL: 7 mg/dL (ref 0–40)

## 2020-06-27 LAB — MAGNESIUM: Magnesium: 1.8 mg/dL (ref 1.7–2.4)

## 2020-06-27 LAB — CBC WITH DIFFERENTIAL (CANCER CENTER ONLY)
Abs Immature Granulocytes: 0.02 10*3/uL (ref 0.00–0.07)
Basophils Absolute: 0.1 10*3/uL (ref 0.0–0.1)
Basophils Relative: 1 %
Eosinophils Absolute: 0 10*3/uL (ref 0.0–0.5)
Eosinophils Relative: 1 %
HCT: 36.6 % (ref 36.0–46.0)
Hemoglobin: 12.2 g/dL (ref 12.0–15.0)
Immature Granulocytes: 1 %
Lymphocytes Relative: 24 %
Lymphs Abs: 0.9 10*3/uL (ref 0.7–4.0)
MCH: 29 pg (ref 26.0–34.0)
MCHC: 33.3 g/dL (ref 30.0–36.0)
MCV: 87.1 fL (ref 80.0–100.0)
Monocytes Absolute: 0.6 10*3/uL (ref 0.1–1.0)
Monocytes Relative: 15 %
Neutro Abs: 2.1 10*3/uL (ref 1.7–7.7)
Neutrophils Relative %: 58 %
Platelet Count: 213 10*3/uL (ref 150–400)
RBC: 4.2 MIL/uL (ref 3.87–5.11)
RDW: 14.1 % (ref 11.5–15.5)
WBC Count: 3.7 10*3/uL — ABNORMAL LOW (ref 4.0–10.5)
nRBC: 0 % (ref 0.0–0.2)

## 2020-06-27 LAB — LACTATE DEHYDROGENASE: LDH: 183 U/L (ref 98–192)

## 2020-06-27 LAB — CMP (CANCER CENTER ONLY)
ALT: 27 U/L (ref 0–44)
AST: 25 U/L (ref 15–41)
Albumin: 3.8 g/dL (ref 3.5–5.0)
Alkaline Phosphatase: 99 U/L (ref 38–126)
Anion gap: 7 (ref 5–15)
BUN: 14 mg/dL (ref 6–20)
CO2: 26 mmol/L (ref 22–32)
Calcium: 9.4 mg/dL (ref 8.9–10.3)
Chloride: 100 mmol/L (ref 98–111)
Creatinine: 0.95 mg/dL (ref 0.44–1.00)
GFR, Estimated: >60 mL/min (ref >60)
Glucose, Bld: 84 mg/dL (ref 70–99)
Potassium: 3.9 mmol/L (ref 3.5–5.1)
Sodium: 133 mmol/L — ABNORMAL LOW (ref 135–145)
Total Bilirubin: 0.4 mg/dL (ref 0.3–1.2)
Total Protein: 7.9 g/dL (ref 6.5–8.1)

## 2020-06-27 LAB — GAMMA GT: GGT: 22 U/L (ref 7–50)

## 2020-06-27 LAB — HEMOGLOBIN A1C
Hgb A1c MFr Bld: 5.1 % (ref 4.8–5.6)
Mean Plasma Glucose: 99.67 mg/dL

## 2020-06-27 LAB — PHOSPHORUS: Phosphorus: 4.2 mg/dL (ref 2.5–4.6)

## 2020-06-27 LAB — URIC ACID: Uric Acid, Serum: 3.5 mg/dL (ref 2.5–7.1)

## 2020-06-27 LAB — LIPASE, BLOOD: Lipase: 29 U/L (ref 11–51)

## 2020-06-27 LAB — AMYLASE: Amylase: 84 U/L (ref 28–100)

## 2020-06-27 LAB — VITAMIN D 25 HYDROXY (VIT D DEFICIENCY, FRACTURES): Vit D, 25-Hydroxy: 73.12 ng/mL (ref 30–100)

## 2020-06-27 LAB — BILIRUBIN, DIRECT: Bilirubin, Direct: 0.1 mg/dL (ref 0.0–0.2)

## 2020-06-27 MED ORDER — GOSERELIN ACETATE 3.6 MG ~~LOC~~ IMPL
DRUG_IMPLANT | SUBCUTANEOUS | Status: AC
  Filled 2020-06-27: qty 3.6

## 2020-06-27 MED ORDER — METFORMIN HCL 500 MG PO TABS: 500.0000 mg | ORAL_TABLET | Freq: Two times a day (BID) | ORAL | 0 refills | Status: DC

## 2020-06-27 MED ORDER — INV-RIBOCICLIB 200 MG TAB NATALEE TRIO003 STUDY: 400.0000 mg | ORAL_TABLET | Freq: Every day | ORAL | 0 refills | Status: DC

## 2020-06-27 MED ORDER — OZEMPIC (1 MG/DOSE) 4 MG/3ML ~~LOC~~ SOPN: PEN_INJECTOR | SUBCUTANEOUS | Status: DC

## 2020-06-27 MED ORDER — VITAMIN D (ERGOCALCIFEROL) 1.25 MG (50000 UNIT) PO CAPS: ORAL_CAPSULE | ORAL | 0 refills | Status: DC

## 2020-06-27 MED FILL — VIT D2 1.25 MG (50,000 UNIT: 1.25 MG | 30 days supply | Qty: 3 | Fill #0

## 2020-06-27 MED FILL — METFORMIN HCL 500 MG TABS: 500 | 30 days supply | Qty: 60 | Fill #0

## 2020-06-27 NOTE — Research (Addendum)
Mackenzie [name]   06/27/20 9:45am  [name] arrived today unaccompanied for her scheduled Cycle 28 visit on the [facility] protocol.  Hendricks Limes, RN accompanied this RN on this research visit.  Labs: Lab work was drawn per protocol; patient in fasted state.  Additional, non-protocol labwork was drawn for her routine visit also conducted today with another provider in another therapeutic area.  Pregnancy test deferred, patient is non-child bearing potential. Lab results reviewed by Dr. Jana Hakim.  Please see his documentation regarding menopausal status and use of goserelin. Corrected Calcium Formula:  4 g/dL utilized for "normal albumin;" local normal range is 3.5-5 g/dL. Corrected Calcium = (0.8 x [normal albumin - patients albumin]) + serum calcium Correct Calcium = (0.8 x[4-3.8]) + 9.4 Corrected Calcium = 9.56 g.dL  Vital Signs: Patient was seated for five minutes prior to measurement of vital signs.  Vital signs are recorded in Dr. [name] office visit encounter from today.  Healthcare Resource Utilization: Patient has not been admitted to any healthcare facility since her last visit.  Adverse Events and Concomitant Medications: Patient confirmed her medication list is correct.  Patient has started semaglutide to support weight loss, first dose 04/29/2020.  She reports she received pfizer COVID-19 immunization booster in late September, 2021 does not recall exact date. Patient confirmed many previously notes AEs are ongoing, without change.  These are listed in AE table below as ongoing.  Patient confirms she no longer experincing numbness/tingling in her left arm, last experienced in early December 2021.  Patient confirms she is no longer experiencing blurred vision, she last noticed this in mid December 2021.   Patient notes experiencing a new adverse event of nausea.  This waxes and wanes throughout the day, no vomiting, she has not taken any medication to relieve symptoms.  She  notes improvement after eating something like saltine crackers.  Grade 1, state date 06/13/2020.  Dr. Jana Hakim discussed with patient and confirms this is not related to ribociclib, nor letrozole. Dr. Jana Hakim spoke with the patient today regarding LDL result, and encouraged her to speak with her primary care provider to address.  He did not prescribe an intervention for the LDL, as such it is not recorded as an adverse event. Patient does not require any dose modification, will continue ribociclib at 400mg .  Physical Exam and Performance Status:  Patient examined today by Dr. Jana Hakim.  Please see his office visit encounter for his notes.  Investigational Medication:  Patient had lost her trial provided drug diaries, but made note of her dosing.  New drug diaries were provided to the patient and she completed this morning.   Patient returned the following kits, drug count confirmed by [name], PharmD: [id number], five pills remaining indicating one missed dose.  Patient diary on Cycle 25 concurs, with one missed day of dosing.  Patient missed both ribociclib and letrozole on this day. 160737, three pills remaining. Patient was 100% compliant with dosing; patient diary cycle 26 concurs with 100% compliance on dosing. [id number], five pills remaining indicating one missed dose.  Patient diary on Cycle 27 concurs, with one missed day of dosing.  Patient missed both ribociclib and letrozole on this day. Visit was logged in the IRT system and the following kits were dispensed to the patient: [id number], [id number], [phone].  Discussed with patient strategies to continue daily dosing.  Patient verbalized understanding of how to take medications.  Overall patient compliance with daily dosing has improved.  Patient was also provided with drug diaries  for cycle 28, 29, 30. Patient was dosed in clinic at 10:35am 06/27/20 with today's dose of 400mg  ribociclib and daily letrozole.  Visit Scheduling - Patient will return in  3 months for her next trial visit, anticipated for lab work on 09/14/20 and provider visit on 09/17/20.  Patient in agreement.  Patient aware that she should contact the clinical at any time if she has any questions or concerns prior to her next visit.  Patient thanked for her ongoing participation in the [facility] trial.  AE Table: Event Grade Onset Date End Date Status Comments Attribution to Ribociclib Attribution to Letrozole Attribution to Goserelin  Allergic Rhinitis 2 2012  ongoing At Baseline n/a n/a n/a  Constipation 1 08/27/17  ongoing At Baseline n/a n/a n/a  Cough, intermittent 2 05/09/18  ongoing At Baseline n/a n/a n/a  GERD, intermittent 1 2018  ongoing At Baseline n/a n/a n/a  Gluten Sensitivity 1 12/2017  ongoing At Baseline n/a n/a n/a  Headaches 1 02/2017  ongoing At Baseline n/a n/a n/a  Hot Flashes 2 03/2017 04/02/20 ongoing At Baseline n/a n/a n/a  Hot Flashes 1 04/02/20  ongoing      Hyperglycemia 1 2018  ongoing At Baseline n/a n/a n/a  Hyperlipidemia 1 03/2016  ongoing At Baseline n/a n/a n/a  Vitamin D Deficiency 2 05/2015  ongoing At Baseline n/a n/a n/a  Arthralgias bilat legs 1 10/01/18  ongoing At Baseline     Bilateral foot pain/ Pes Planus 2   ongoing At Baseline, recently needed new orthotics     Fatigue 1 06/28/18  ongoing   Yes No No  Blurred Vision 1 07/14/18 05/23/2020   No No No  Hypertension  1-3 2012  ongoing At Baseline. Intermittent grade 1 - 3 recorded since 2012. Changed to History at Baseline.      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 Gabapentin Yes No No   Dyspnea on exertion 1 08/09/19  ongoing  No No No  Lymphedema bilateral arms 1 07/25/19  ongoing PHT and Sleeves No No No  Bilateral Knee Pain 2 07/11/19  ongoing Intermittent. PHT and steroid injections No No No  Nail Infection (fungus on toenails) 1 10/08/19  ongoing  No No No  Vaginal Dryness 1 10/17/19  ongoing  No Yes Yes  Alopecia 1 12/23/19  ongoing  No Yes Yes  Left Leg Heavy and Numb, Musculoskeletal, Other 1 01/09/20  ongoing  No No No  Anemia 1 04/02/20  ongoing  Yes No No  Nausea 1 06/13/2020  ongoing untreated No No No   Doreatha Martin, RN, BSN, [facility] 06/27/2020 1:37 PM

## 2020-06-28 NOTE — Progress Notes (Signed)
TeleHealth Visit:  Due to the COVID-19 pandemic, this visit was completed with telemedicine (audio/video) technology to reduce patient and provider exposure as well as to preserve personal protective equipment.   Mackenzie [name] has verbally consented to this TeleHealth visit. The patient is located at home, the provider is located at the [facility] office. The participants in this visit include the listed provider and patient. The visit was conducted today via MyChart video.   Chief Complaint: OBESITY Mackenzie [name] is here to discuss her progress with her obesity treatment plan along with follow-up of her obesity related diagnoses. Mackenzie [name] is on the Category 3 Plan and states she is following her eating plan approximately 75% of the time. Mackenzie [name] states she is doing 0 minutes 0 times per week.  Today's visit was #: 21 Starting weight: 267 lbs Starting date: 08/09/2019  Interim History: Mackenzie [name] reports that since the holidays she has been unable to get back in a routine with her plan. She haven't been meal planning or prepping, and just making food as she goes from day to day.  Subjective:   1. Pre-diabetes Mackenzie [name] is on Ozempic and metformin. She reports intermittent nausea for the last week, but she is feeling that it is unrelated to Ozempic or metformin. She denies vomiting or diarrhea.  2. Vitamin D deficiency Mackenzie [name] is on Vit D, and she reports low energy.  Assessment/Plan:   1. Pre-diabetes Mackenzie [name] will continue to work on weight loss, exercise, and decreasing simple carbohydrates to help decrease the risk of diabetes. We will refill both Ozempic and metformin for 1 month.  - metFORMIN (GLUCOPHAGE) 500 MG tablet; Take 1 tablet (500 mg total) by mouth 2 (two) times daily with a meal.  Dispense: 60 tablet; Refill: 0  2. Vitamin D deficiency Low Vitamin D level contributes to fatigue and are associated with obesity, breast, and colon cancer. We will refill prescription  Vitamin D for 1 month. Mackenzie [name] will follow-up for routine testing of Vitamin D, at least 2-3 times per year to avoid over-replacement.  - Vitamin D, Ergocalciferol, (DRISDOL) 1.25 MG (50000 UNIT) CAPS capsule; 1 po q 10d  Dispense: 3 capsule; Refill: 0  3. Class 3 severe obesity with serious comorbidity and body mass index (BMI) of 40.0 to 44.9 in adult, unspecified obesity type (HCC) Mackenzie [name] is currently in the action stage of change. As such, her goal is to continue with weight loss efforts. She has agreed to the Category 3 Plan.   Exercise goals: No exercise has been prescribed at this time.  Behavioral modification strategies: meal planning and cooking strategies.  [name] has agreed to follow-up with our clinic in 2 weeks. She was informed of the importance of frequent follow-up visits to maximize her success with intensive lifestyle modifications for her multiple health conditions.  Objective:   VITALS: Per patient if applicable, see vitals. GENERAL: Alert and in no acute distress. CARDIOPULMONARY: No increased WOB. Speaking in clear sentences.  PSYCH: Pleasant and cooperative. Speech normal rate and rhythm. Affect is appropriate. Insight and judgement are appropriate. Attention is focused, linear, and appropriate.  NEURO: Oriented as arrived to appointment on time with no prompting.   Lab Results  Component Value Date   CREATININE 0.95 06/27/2020   BUN 14 06/27/2020   NA 133 (L) 06/27/2020   K 3.9 06/27/2020   CL 100 06/27/2020   CO2 26 06/27/2020   Lab Results  Component Value Date   ALT 27 06/27/2020   AST 25  06/27/2020   GGT 22 06/27/2020   ALKPHOS 99 06/27/2020   BILITOT 0.4 06/27/2020   Lab Results  Component Value Date   HGBA1C 5.1 06/27/2020   HGBA1C 5.6 01/09/2020   HGBA1C 5.9 (H) 08/09/2019   HGBA1C 5.9 (H) 07/26/2018   Lab Results  Component Value Date   INSULIN 13.3 01/11/2020   INSULIN 12.6 08/09/2019   Lab Results  Component Value Date   TSH  0.657 08/09/2019   Lab Results  Component Value Date   CHOL 213 (H) 06/27/2020   HDL 62 06/27/2020   LDLCALC 144 (H) 06/27/2020   TRIG 33 06/27/2020   CHOLHDL 3.4 06/27/2020   Lab Results  Component Value Date   WBC 3.7 (L) 06/27/2020   HGB 12.2 06/27/2020   HCT 36.6 06/27/2020   MCV 87.1 06/27/2020   PLT 213 06/27/2020   No results found for: IRON, TIBC, FERRITIN  Attestation Statements:   Reviewed by clinician on day of visit: allergies, medications, problem list, medical history, surgical history, family history, social history, and previous encounter notes.   Wilhemena Durie, am acting as transcriptionist for [employer], PA-C.  I have reviewed the above documentation for accuracy and completeness, and I agree with the above. Abby Potash, PA-C

## 2020-06-29 ENCOUNTER — Other Ambulatory Visit (INDEPENDENT_AMBULATORY_CARE_PROVIDER_SITE_OTHER): Payer: Self-pay | Admitting: Family Medicine

## 2020-06-29 ENCOUNTER — Telehealth: Payer: Self-pay | Admitting: Oncology

## 2020-06-29 ENCOUNTER — Encounter (INDEPENDENT_AMBULATORY_CARE_PROVIDER_SITE_OTHER): Payer: Self-pay | Admitting: Physician Assistant

## 2020-06-29 DIAGNOSIS — R7303 Prediabetes: Secondary | ICD-10-CM

## 2020-06-29 NOTE — Telephone Encounter (Signed)
No 1/19 los. No changes made to pt's schedule.  

## 2020-07-02 NOTE — Telephone Encounter (Signed)
I ordered this on 1/19 after our visit. Can you check with the pharmacy on this please?

## 2020-07-04 ENCOUNTER — Other Ambulatory Visit (INDEPENDENT_AMBULATORY_CARE_PROVIDER_SITE_OTHER): Payer: Self-pay

## 2020-07-04 ENCOUNTER — Telehealth (INDEPENDENT_AMBULATORY_CARE_PROVIDER_SITE_OTHER): Payer: Self-pay

## 2020-07-04 ENCOUNTER — Other Ambulatory Visit (INDEPENDENT_AMBULATORY_CARE_PROVIDER_SITE_OTHER): Payer: Self-pay | Admitting: Physician Assistant

## 2020-07-04 DIAGNOSIS — R7303 Prediabetes: Secondary | ICD-10-CM

## 2020-07-04 MED ORDER — OZEMPIC (1 MG/DOSE) 4 MG/3ML ~~LOC~~ SOPN: PEN_INJECTOR | SUBCUTANEOUS | 0 refills | Status: DC

## 2020-07-04 MED FILL — OZEMPIC (1 MG/DOSE) 4 MG/3M: 4 | 28 days supply | Qty: 3 | Fill #0

## 2020-07-04 NOTE — Telephone Encounter (Signed)
Pt called stating that she needs a refill on her medication OZEMPIC, pt would like to receive a call back at [phone].

## 2020-07-05 ENCOUNTER — Telehealth: Payer: Self-pay | Admitting: *Deleted

## 2020-07-05 NOTE — Telephone Encounter (Signed)
Last OV with Tracey 

## 2020-07-05 NOTE — Telephone Encounter (Signed)
[name]; Notified patient of appointments made for labs to check hormone levels on 08/31/20, fasting labs on 09/14/20 and study visit with Dr. Jana Hakim on 09/17/20.  Patient confirmed these appointments.  Re; Letrozole intake previous diary; Noted that Letrozole was not documented on previous cycle's diary for 06/25/20 and 06/26/20 since the dates on the diary ended on 06/24/20 and patient did not receive a new diary until she was in clinic on 06/27/20. Confirmed with patient she did take Letrozole on 06/25/20 and 06/26/20 and she recorded doses in her phone. Patient states she also added them to the current cycle's diary which she will bring with her on next study visit.  Thanked patient for her time and encouraged her to call if any questions or concerns prior to next contact.  Foye Spurling, BSN, RN Clinical Research Nurse 07/05/2020 1:36 PM

## 2020-07-05 NOTE — Telephone Encounter (Signed)
Refill request

## 2020-07-09 ENCOUNTER — Other Ambulatory Visit (INDEPENDENT_AMBULATORY_CARE_PROVIDER_SITE_OTHER): Payer: Self-pay | Admitting: Physician Assistant

## 2020-07-09 MED ORDER — OZEMPIC (1 MG/DOSE) 4 MG/3ML ~~LOC~~ SOPN: PEN_INJECTOR | SUBCUTANEOUS | 0 refills | Status: DC

## 2020-07-09 NOTE — Telephone Encounter (Signed)
Ok to send    thanks

## 2020-07-09 NOTE — Telephone Encounter (Signed)
Ozempic

## 2020-07-09 NOTE — Telephone Encounter (Signed)
What med was this for? It doesn't say in the message.

## 2020-07-09 NOTE — Telephone Encounter (Signed)
RX refill Ozempic sent to [facility].

## 2020-07-11 ENCOUNTER — Ambulatory Visit (INDEPENDENT_AMBULATORY_CARE_PROVIDER_SITE_OTHER): Payer: 59 | Admitting: Physician Assistant

## 2020-07-11 ENCOUNTER — Other Ambulatory Visit: Payer: Self-pay

## 2020-07-11 ENCOUNTER — Encounter (INDEPENDENT_AMBULATORY_CARE_PROVIDER_SITE_OTHER): Payer: Self-pay | Admitting: Physician Assistant

## 2020-07-11 VITALS — BP 121/80 | HR 88 | Temp 98.1°F | Ht 65.0 in | Wt 256.0 lb

## 2020-07-11 DIAGNOSIS — R7303 Prediabetes: Secondary | ICD-10-CM | POA: Diagnosis not present

## 2020-07-11 DIAGNOSIS — Z6841 Body Mass Index (BMI) 40.0 and over, adult: Secondary | ICD-10-CM | POA: Diagnosis not present

## 2020-07-11 DIAGNOSIS — E559 Vitamin D deficiency, unspecified: Secondary | ICD-10-CM

## 2020-07-11 DIAGNOSIS — Z9189 Other specified personal risk factors, not elsewhere classified: Secondary | ICD-10-CM

## 2020-07-12 NOTE — Progress Notes (Signed)
Chief Complaint:   OBESITY Mackenzie [name] is here to discuss her progress with her obesity treatment plan along with follow-up of her obesity related diagnoses. Mackenzie [name] is on the Category 3 Plan and states she is following her eating plan approximately 85% of the time. Mackenzie [name] states she is doing Zumba for 45 minutes 2 times per week.  Today's visit was #: 22 Starting weight: 267 lbs Starting date: 08/09/2019 Today's weight: 256 lbs Today's date: 07/11/2020 Total lbs lost to date: 11 Total lbs lost since last in-office visit: 3  Interim History: Mackenzie [name] states that she has been meal planning much better the last 2 weeks. She is also exercising more. She is drinking about 80 oz of water daily.  Subjective:   1. Pre-diabetes Mackenzie [name] is on Ozempic, and she denies nausea, vomiting, or diarrhea. She is tolerating Ozempic well and she is able to get all of her food in most days. She notes her hunger is controlled. She is also on metformin without side effects. I discussed labs with the patient today.  2. Vitamin D deficiency Mackenzie [name] is on Vit D every 10 days. Last Vit D level was at goal. I discussed labs with the patient today.  3. At risk for diabetes mellitus Mackenzie [name] is at higher than average risk for developing diabetes due to obesity.   Assessment/Plan:   1. Pre-diabetes Mackenzie [name] will continue with Ozempic and metformin, and will continue to work on weight loss, exercise, and decreasing simple carbohydrates to help decrease the risk of diabetes.   2. Vitamin D deficiency Low Vitamin D level contributes to fatigue and are associated with obesity, breast, and colon cancer. Mackenzie [name] agreed to change prescription Vitamin D to 50,000 IU every 14 days and will follow-up for routine testing of Vitamin D, at least 2-3 times per year to avoid over-replacement.  3. At risk for diabetes mellitus Mackenzie [name] was given approximately 15 minutes of diabetes education and counseling today. We discussed  intensive lifestyle modifications today with an emphasis on weight loss as well as increasing exercise and decreasing simple carbohydrates in her diet. We also reviewed medication options with an emphasis on risk versus benefit of those discussed.   Repetitive spaced learning was employed today to elicit superior memory formation and behavioral change.  4. Class 3 severe obesity with serious comorbidity and body mass index (BMI) of 40.0 to 44.9 in adult, unspecified obesity type (HCC) Mackenzie [name] is currently in the action stage of change. As such, her goal is to continue with weight loss efforts. She has agreed to the Category 3 Plan.   Exercise goals: As is.  Behavioral modification strategies: meal planning and cooking strategies and keeping healthy foods in the home.  Mackenzie [name] has agreed to follow-up with our clinic in 2 weeks. She was informed of the importance of frequent follow-up visits to maximize her success with intensive lifestyle modifications for her multiple health conditions.   Objective:   Blood pressure 121/80, pulse 88, temperature 98.1 F (36.7 C), height 5\' 5"  (1.651 m), weight 256 lb (116.1 kg), last menstrual period 02/10/2017, SpO2 100 %, unknown if currently breastfeeding. Body mass index is 42.6 kg/m.  General: Cooperative, alert, well developed, in no acute distress. HEENT: Conjunctivae and lids unremarkable. Cardiovascular: Regular rhythm.  Lungs: Normal work of breathing. Neurologic: No focal deficits.   Lab Results  Component Value Date   CREATININE 0.95 06/27/2020   BUN 14 06/27/2020   NA 133 (L) 06/27/2020   K 3.9 06/27/2020  CL 100 06/27/2020   CO2 26 06/27/2020   Lab Results  Component Value Date   ALT 27 06/27/2020   AST 25 06/27/2020   GGT 22 06/27/2020   ALKPHOS 99 06/27/2020   BILITOT 0.4 06/27/2020   Lab Results  Component Value Date   HGBA1C 5.1 06/27/2020   HGBA1C 5.6 01/09/2020   HGBA1C 5.9 (H) 08/09/2019   HGBA1C 5.9 (H)  07/26/2018   Lab Results  Component Value Date   INSULIN 13.3 01/11/2020   INSULIN 12.6 08/09/2019   Lab Results  Component Value Date   TSH 0.657 08/09/2019   Lab Results  Component Value Date   CHOL 213 (H) 06/27/2020   HDL 62 06/27/2020   LDLCALC 144 (H) 06/27/2020   TRIG 33 06/27/2020   CHOLHDL 3.4 06/27/2020   Lab Results  Component Value Date   WBC 3.7 (L) 06/27/2020   HGB 12.2 06/27/2020   HCT 36.6 06/27/2020   MCV 87.1 06/27/2020   PLT 213 06/27/2020   No results found for: IRON, TIBC, FERRITIN  Attestation Statements:   Reviewed by clinician on day of visit: allergies, medications, problem list, medical history, surgical history, family history, social history, and previous encounter notes.   Wilhemena Durie, am acting as transcriptionist for [employer], PA-C.  I have reviewed the above documentation for accuracy and completeness, and I agree with the above. Abby Potash, PA-C

## 2020-07-16 ENCOUNTER — Encounter (INDEPENDENT_AMBULATORY_CARE_PROVIDER_SITE_OTHER): Payer: Self-pay

## 2020-07-23 ENCOUNTER — Other Ambulatory Visit: Payer: 59

## 2020-07-23 ENCOUNTER — Ambulatory Visit: Payer: 59

## 2020-07-25 ENCOUNTER — Encounter (INDEPENDENT_AMBULATORY_CARE_PROVIDER_SITE_OTHER): Payer: Self-pay | Admitting: Physician Assistant

## 2020-07-25 ENCOUNTER — Ambulatory Visit (INDEPENDENT_AMBULATORY_CARE_PROVIDER_SITE_OTHER): Payer: 59 | Admitting: Physician Assistant

## 2020-07-25 ENCOUNTER — Other Ambulatory Visit: Payer: Self-pay

## 2020-07-25 ENCOUNTER — Other Ambulatory Visit (INDEPENDENT_AMBULATORY_CARE_PROVIDER_SITE_OTHER): Payer: Self-pay | Admitting: Physician Assistant

## 2020-07-25 VITALS — BP 112/79 | HR 91 | Temp 98.1°F | Ht 65.0 in | Wt 261.0 lb

## 2020-07-25 DIAGNOSIS — E559 Vitamin D deficiency, unspecified: Secondary | ICD-10-CM | POA: Diagnosis not present

## 2020-07-25 DIAGNOSIS — Z9189 Other specified personal risk factors, not elsewhere classified: Secondary | ICD-10-CM

## 2020-07-25 DIAGNOSIS — Z6841 Body Mass Index (BMI) 40.0 and over, adult: Secondary | ICD-10-CM

## 2020-07-25 DIAGNOSIS — R7303 Prediabetes: Secondary | ICD-10-CM | POA: Diagnosis not present

## 2020-07-25 MED ORDER — OZEMPIC (1 MG/DOSE) 4 MG/3ML ~~LOC~~ SOPN: PEN_INJECTOR | SUBCUTANEOUS | 0 refills | Status: DC

## 2020-07-25 MED ORDER — VITAMIN D (ERGOCALCIFEROL) 1.25 MG (50000 UNIT) PO CAPS: ORAL_CAPSULE | ORAL | 0 refills | Status: DC

## 2020-07-25 MED FILL — VIT D2 1.25 MG (50,000 UNIT: 1.25 MG | 30 days supply | Qty: 3 | Fill #0

## 2020-07-26 NOTE — Progress Notes (Signed)
Chief Complaint:   OBESITY Mackenzie [name] is here to discuss her progress with her obesity treatment plan along with follow-up of her obesity related diagnoses. Mackenzie [name] is on the Category 3 Plan and states she is following her eating plan approximately 85-90% of the time. [name] states she is doing Zumba for 60 minutes 3 times per week.  Today's visit was #: 23 Starting weight: 267 lbs Starting date: 08/09/2019 Today's weight: 261 lbs Today's date: 07/25/2020 Total lbs lost to date: 6 Total lbs lost since last in-office visit: 0  Interim History: Mackenzie [name] reports that she is surprised with the weight gain today, as she thought she was doing well. She is eating all of the food on the plan, meal planning, counting snack calories, and drinking 100 oz of fluid daily. Her hunger is controlled. Her fluid weight is up today.  Subjective:   1. Pre-diabetes [name]'s hunger is controlled on Ozempic 1 mg. She denies polyphagia. She is able to eat all of the food on the plan.  2. Vitamin D deficiency Mackenzie [name] is on Vit D every 10 days. Her energy is report to be good most days.  3. At risk for hypoglycemia Mackenzie [name] is at increased risk for hypoglycemia due to changes in diet, diagnosis of diabetes, and/or insulin use.   Assessment/Plan:   1. Pre-diabetes Mackenzie [name] will continue to work on weight loss, exercise, and decreasing simple carbohydrates to help decrease the risk of diabetes.  We will refill Ozempic for 1 month.  - Semaglutide, 1 MG/DOSE, (OZEMPIC, 1 MG/DOSE,) 4 MG/3ML SOPN; Inject 1 mg weekly  Dispense: 3 mL; Refill: 0  2. Vitamin D deficiency Low Vitamin D level contributes to fatigue and are associated with obesity, breast, and colon cancer. We will refill prescription Vitamin D for 1 month. Mackenzie [name] will follow-up for routine testing of Vitamin D, at least 2-3 times per year to avoid over-replacement.  - Vitamin D, Ergocalciferol, (DRISDOL) 1.25 MG (50000 UNIT) CAPS capsule; 1 po q 10d   Dispense: 3 capsule; Refill: 0  3. At risk for hypoglycemia Mackenzie [name] was given approximately 15 minutes of counseling today regarding prevention of hypoglycemia. She was advised of symptoms of hypoglycemia. Danielle was instructed to avoid skipping meals, eat regular protein rich meals and schedule low calorie snacks as needed.   Repetitive spaced learning was employed today to elicit superior memory formation and behavioral change  4. Class 3 severe obesity with serious comorbidity and body mass index (BMI) of 40.0 to 44.9 in adult, unspecified obesity type (HCC) Mackenzie [name] is currently in the action stage of change. As such, her goal is to continue with weight loss efforts. She has agreed to the Category 3 Plan.   Exercise goals: As is.  Behavioral modification strategies: avoiding temptations and planning for success.  Mackenzie [name] has agreed to follow-up with our clinic in 2 weeks. She was informed of the importance of frequent follow-up visits to maximize her success with intensive lifestyle modifications for her multiple health conditions.   Objective:   Blood pressure 112/79, pulse 91, temperature 98.1 F (36.7 C), height 5\' 5"  (1.651 m), weight 261 lb (118.4 kg), last menstrual period 02/10/2017, SpO2 97 %, unknown if currently breastfeeding. Body mass index is 43.43 kg/m.  General: Cooperative, alert, well developed, in no acute distress. HEENT: Conjunctivae and lids unremarkable. Cardiovascular: Regular rhythm.  Lungs: Normal work of breathing. Neurologic: No focal deficits.   Lab Results  Component Value Date   CREATININE 0.95 06/27/2020   BUN  14 06/27/2020   NA 133 (L) 06/27/2020   K 3.9 06/27/2020   CL 100 06/27/2020   CO2 26 06/27/2020   Lab Results  Component Value Date   ALT 27 06/27/2020   AST 25 06/27/2020   GGT 22 06/27/2020   ALKPHOS 99 06/27/2020   BILITOT 0.4 06/27/2020   Lab Results  Component Value Date   HGBA1C 5.1 06/27/2020   HGBA1C 5.6 01/09/2020    HGBA1C 5.9 (H) 08/09/2019   HGBA1C 5.9 (H) 07/26/2018   Lab Results  Component Value Date   INSULIN 13.3 01/11/2020   INSULIN 12.6 08/09/2019   Lab Results  Component Value Date   TSH 0.657 08/09/2019   Lab Results  Component Value Date   CHOL 213 (H) 06/27/2020   HDL 62 06/27/2020   LDLCALC 144 (H) 06/27/2020   TRIG 33 06/27/2020   CHOLHDL 3.4 06/27/2020   Lab Results  Component Value Date   WBC 3.7 (L) 06/27/2020   HGB 12.2 06/27/2020   HCT 36.6 06/27/2020   MCV 87.1 06/27/2020   PLT 213 06/27/2020   No results found for: IRON, TIBC, FERRITIN  Attestation Statements:   Reviewed by clinician on day of visit: allergies, medications, problem list, medical history, surgical history, family history, social history, and previous encounter notes.   Wilhemena Durie, am acting as transcriptionist for [employer], PA-C.  I have reviewed the above documentation for accuracy and completeness, and I agree with the above. Abby Potash, PA-C

## 2020-07-30 ENCOUNTER — Encounter: Payer: Self-pay | Admitting: *Deleted

## 2020-07-31 MED FILL — OZEMPIC (1 MG/DOSE) 4 MG/3M: 4 | 28 days supply | Qty: 3 | Fill #0

## 2020-08-08 ENCOUNTER — Encounter (INDEPENDENT_AMBULATORY_CARE_PROVIDER_SITE_OTHER): Payer: Self-pay | Admitting: Physician Assistant

## 2020-08-08 ENCOUNTER — Other Ambulatory Visit (INDEPENDENT_AMBULATORY_CARE_PROVIDER_SITE_OTHER): Payer: Self-pay | Admitting: Physician Assistant

## 2020-08-08 ENCOUNTER — Ambulatory Visit (INDEPENDENT_AMBULATORY_CARE_PROVIDER_SITE_OTHER): Payer: 59 | Admitting: Physician Assistant

## 2020-08-08 ENCOUNTER — Other Ambulatory Visit: Payer: Self-pay

## 2020-08-08 VITALS — BP 135/85 | HR 73 | Temp 98.1°F | Ht 65.0 in | Wt 253.0 lb

## 2020-08-08 DIAGNOSIS — Z9189 Other specified personal risk factors, not elsewhere classified: Secondary | ICD-10-CM

## 2020-08-08 DIAGNOSIS — Z6841 Body Mass Index (BMI) 40.0 and over, adult: Secondary | ICD-10-CM

## 2020-08-08 DIAGNOSIS — R7303 Prediabetes: Secondary | ICD-10-CM

## 2020-08-08 DIAGNOSIS — E559 Vitamin D deficiency, unspecified: Secondary | ICD-10-CM

## 2020-08-08 MED ORDER — METFORMIN HCL 500 MG PO TABS: 500.0000 mg | ORAL_TABLET | Freq: Two times a day (BID) | ORAL | 0 refills | Status: DC

## 2020-08-08 MED ORDER — VITAMIN D (ERGOCALCIFEROL) 1.25 MG (50000 UNIT) PO CAPS: ORAL_CAPSULE | ORAL | 0 refills | Status: DC

## 2020-08-08 MED FILL — METFORMIN HCL 500 MG TABS: 500 | 90 days supply | Qty: 180 | Fill #0

## 2020-08-08 NOTE — Telephone Encounter (Signed)
Tracey 

## 2020-08-09 NOTE — Progress Notes (Signed)
Chief Complaint:   OBESITY Mackenzie [name] is here to discuss her progress with her obesity treatment plan along with follow-up of her obesity related diagnoses. Mackenzie [name] is on the Category 3 Plan and states she is following her eating plan approximately 80% of the time. Mackenzie [name] states she is doing Zumba for 60 minutes 2 times per week.  Today's visit was #: 24 Starting weight: 267 lbs Starting date: 08/09/2019 Today's weight: 253 lbs Today's date: 08/08/2020 Total lbs lost to date: 14 Total lbs lost since last in-office visit: 8  Interim History: Mackenzie [name] did very well with weight loss. She has been journaling dinner. There were a couple of occasions where she ate out or did not eat enough. Overall she is feeling good and following the plan well.  Subjective:   1. Vitamin D deficiency Mackenzie [name] is on Vit D, and she denies nausea, vomiting, or muscle weakness. Her energy level is good.  2. Pre-diabetes Mackenzie [name] is on Ozempic and metformin, and she denies polyphagia or hypoglycemia.  3. At risk for diabetes mellitus Mackenzie [name] is at higher than average risk for developing diabetes due to obesity.   Assessment/Plan:   1. Vitamin D deficiency Low Vitamin D level contributes to fatigue and are associated with obesity, breast, and colon cancer. We will refill prescription Vitamin D for 1 month. Mackenzie [name] will follow-up for routine testing of Vitamin D, at least 2-3 times per year to avoid over-replacement.  - Vitamin D, Ergocalciferol, (DRISDOL) 1.25 MG (50000 UNIT) CAPS capsule; 1 po q 10d  Dispense: 3 capsule; Refill: 0  2. Pre-diabetes Mackenzie [name] will continue to work on weight loss, exercise, and decreasing simple carbohydrates to help decrease the risk of diabetes. We will refill metformin for 1 month.  - metFORMIN (GLUCOPHAGE) 500 MG tablet; Take 1 tablet (500 mg total) by mouth 2 (two) times daily with a meal.  Dispense: 60 tablet; Refill: 0  3. At risk for diabetes mellitus Mackenzie [name] was given  approximately 15 minutes of diabetes education and counseling today. We discussed intensive lifestyle modifications today with an emphasis on weight loss as well as increasing exercise and decreasing simple carbohydrates in her diet. We also reviewed medication options with an emphasis on risk versus benefit of those discussed.   Repetitive spaced learning was employed today to elicit superior memory formation and behavioral change.  4. Class 3 severe obesity with serious comorbidity and body mass index (BMI) of 40.0 to 44.9 in adult, unspecified obesity type (HCC) Mackenzie [name] is currently in the action stage of change. As such, her goal is to continue with weight loss efforts. She has agreed to the Category 3 Plan.   Exercise goals: As is.  Behavioral modification strategies: decreasing eating out and meal planning and cooking strategies.  Mackenzie [name] has agreed to follow-up with our clinic in 2 weeks. She was informed of the importance of frequent follow-up visits to maximize her success with intensive lifestyle modifications for her multiple health conditions.   Objective:   Blood pressure 135/85, pulse 73, temperature 98.1 F (36.7 C), height 5\' 5"  (1.651 m), weight 253 lb (114.8 kg), last menstrual period 02/10/2017, SpO2 99 %, unknown if currently breastfeeding. Body mass index is 42.1 kg/m.  General: Cooperative, alert, well developed, in no acute distress. HEENT: Conjunctivae and lids unremarkable. Cardiovascular: Regular rhythm.  Lungs: Normal work of breathing. Neurologic: No focal deficits.   Lab Results  Component Value Date   CREATININE 0.95 06/27/2020   BUN 14 06/27/2020   NA  133 (L) 06/27/2020   K 3.9 06/27/2020   CL 100 06/27/2020   CO2 26 06/27/2020   Lab Results  Component Value Date   ALT 27 06/27/2020   AST 25 06/27/2020   GGT 22 06/27/2020   ALKPHOS 99 06/27/2020   BILITOT 0.4 06/27/2020   Lab Results  Component Value Date   HGBA1C 5.1 06/27/2020   HGBA1C  5.6 01/09/2020   HGBA1C 5.9 (H) 08/09/2019   HGBA1C 5.9 (H) 07/26/2018   Lab Results  Component Value Date   INSULIN 13.3 01/11/2020   INSULIN 12.6 08/09/2019   Lab Results  Component Value Date   TSH 0.657 08/09/2019   Lab Results  Component Value Date   CHOL 213 (H) 06/27/2020   HDL 62 06/27/2020   LDLCALC 144 (H) 06/27/2020   TRIG 33 06/27/2020   CHOLHDL 3.4 06/27/2020   Lab Results  Component Value Date   WBC 3.7 (L) 06/27/2020   HGB 12.2 06/27/2020   HCT 36.6 06/27/2020   MCV 87.1 06/27/2020   PLT 213 06/27/2020   No results found for: IRON, TIBC, FERRITIN  Attestation Statements:   Reviewed by clinician on day of visit: allergies, medications, problem list, medical history, surgical history, family history, social history, and previous encounter notes.   Wilhemena Durie, am acting as transcriptionist for [employer], PA-C.  I have reviewed the above documentation for accuracy and completeness, and I agree with the above. Abby Potash, PA-C

## 2020-08-20 ENCOUNTER — Other Ambulatory Visit: Payer: Self-pay | Admitting: Oncology

## 2020-08-21 ENCOUNTER — Telehealth: Payer: Self-pay | Admitting: Oncology

## 2020-08-21 NOTE — Telephone Encounter (Signed)
R/s appointment per 3/14 sch msg. Pt aware.

## 2020-08-22 ENCOUNTER — Ambulatory Visit (INDEPENDENT_AMBULATORY_CARE_PROVIDER_SITE_OTHER): Payer: 59 | Admitting: Physician Assistant

## 2020-08-22 ENCOUNTER — Encounter (INDEPENDENT_AMBULATORY_CARE_PROVIDER_SITE_OTHER): Payer: Self-pay | Admitting: Physician Assistant

## 2020-08-22 ENCOUNTER — Other Ambulatory Visit (INDEPENDENT_AMBULATORY_CARE_PROVIDER_SITE_OTHER): Payer: Self-pay | Admitting: Physician Assistant

## 2020-08-22 ENCOUNTER — Other Ambulatory Visit: Payer: Self-pay

## 2020-08-22 ENCOUNTER — Telehealth (INDEPENDENT_AMBULATORY_CARE_PROVIDER_SITE_OTHER): Payer: Self-pay

## 2020-08-22 VITALS — BP 133/81 | HR 77 | Temp 98.1°F | Ht 65.0 in | Wt 257.0 lb

## 2020-08-22 DIAGNOSIS — E559 Vitamin D deficiency, unspecified: Secondary | ICD-10-CM | POA: Diagnosis not present

## 2020-08-22 DIAGNOSIS — Z6841 Body Mass Index (BMI) 40.0 and over, adult: Secondary | ICD-10-CM | POA: Diagnosis not present

## 2020-08-22 DIAGNOSIS — Z9189 Other specified personal risk factors, not elsewhere classified: Secondary | ICD-10-CM | POA: Diagnosis not present

## 2020-08-22 DIAGNOSIS — R7303 Prediabetes: Secondary | ICD-10-CM

## 2020-08-22 MED ORDER — VITAMIN D (ERGOCALCIFEROL) 1.25 MG (50000 UNIT) PO CAPS
50000.0000 [IU] | ORAL_CAPSULE | ORAL | 0 refills | Status: DC
Start: 2020-08-22 — End: 2020-08-22

## 2020-08-22 MED ORDER — OZEMPIC (1 MG/DOSE) 4 MG/3ML ~~LOC~~ SOPN: PEN_INJECTOR | SUBCUTANEOUS | 0 refills | Status: DC

## 2020-08-22 NOTE — Telephone Encounter (Signed)
Spoke with the pharmacy and informed them the patient is supposed to taking it every 14 days. Mackenzie [name], [place]

## 2020-08-22 NOTE — Telephone Encounter (Signed)
[facility] called about pt's vitamin D directions, they received two types of directions, one is  every 14 days and the other states every 10 days, please give them a call at [phone]

## 2020-08-27 NOTE — Progress Notes (Signed)
Chief Complaint:   OBESITY Mackenzie [name] is here to discuss her progress with her obesity treatment plan along with follow-up of her obesity related diagnoses. Mackenzie [name] is on the Category 3 Plan and states she is following her eating plan approximately 85% of the time. Mackenzie [name] states she is walking and doing zumba for 30-60 minutes 2-3 times per week.  Today's visit was #: 25 Starting weight: 267 lbs Starting date: 08/09/2019 Today's weight: 257 lbs Today's date: 08/22/2020 Total lbs lost to date: 10 Total lbs lost since last in-office visit: 0  Interim History: Mackenzie [name] reports that she was on the plan with only the exceptions of eating out once in the past 2 weeks. She denies excessive hunger. She is exercising regularly. She does not want to journal.  Subjective:   1. Pre-diabetes Mackenzie [name] is on Ozempic 1 mg, and she denies polyphagia. She denies nausea, vomiting, or diarrhea.  2. Vitamin D deficiency Mackenzie [name] is on Vit D every 2 weeks, and she is tolerating it well.  3. At risk for osteoporosis Mackenzie [name] is at higher risk of osteopenia and osteoporosis due to Vitamin D deficiency.   Assessment/Plan:   1. Pre-diabetes Mackenzie [name] will continue to work on weight loss, exercise, and decreasing simple carbohydrates to help decrease the risk of diabetes. We will refill Ozempic for 90 days with no refills.  - Semaglutide, 1 MG/DOSE, (OZEMPIC, 1 MG/DOSE,) 4 MG/3ML SOPN; Inject 1 mg weekly  Dispense: 27 mL; Refill: 0  2. Vitamin D deficiency Low Vitamin D level contributes to fatigue and are associated with obesity, breast, and colon cancer. We will refill prescription Vitamin D for 90 days with no refills. Mackenzie [name] will follow-up for routine testing of Vitamin D, at least 2-3 times per year to avoid over-replacement.  - Vitamin D, Ergocalciferol, (DRISDOL) 1.25 MG (50000 UNIT) CAPS capsule; Take 1 capsule (50,000 Units total) by mouth every 14 (fourteen) days. 1 po q 10d  Dispense: 6 capsule;  Refill: 0  3. At risk for osteoporosis Mackenzie [name] was given approximately 15 minutes of osteoporosis prevention counseling today. Mackenzie [name] is at risk for osteopenia and osteoporosis due to her Vitamin D deficiency. She was encouraged to take her Vitamin D and follow her higher calcium diet and increase strengthening exercise to help strengthen her bones and decrease her risk of osteopenia and osteoporosis.  Repetitive spaced learning was employed today to elicit superior memory formation and behavioral change.  4. Class 3 severe obesity with serious comorbidity and body mass index (BMI) of 40.0 to 44.9 in adult, unspecified obesity type (HCC) Mackenzie [name] is currently in the action stage of change. As such, her goal is to continue with weight loss efforts. She has agreed to the Category 3 Plan + 150 extra protein calories.   Exercise goals: As is.  Behavioral modification strategies: meal planning and cooking strategies.  Mackenzie [name] has agreed to follow-up with our clinic in 2 weeks. She was informed of the importance of frequent follow-up visits to maximize her success with intensive lifestyle modifications for her multiple health conditions.   Objective:   Blood pressure 133/81, pulse 77, temperature 98.1 F (36.7 C), height 5\' 5"  (1.651 m), weight 257 lb (116.6 kg), last menstrual period 02/10/2017, SpO2 98 %, unknown if currently breastfeeding. Body mass index is 42.77 kg/m.  General: Cooperative, alert, well developed, in no acute distress. HEENT: Conjunctivae and lids unremarkable. Cardiovascular: Regular rhythm.  Lungs: Normal work of breathing. Neurologic: No focal deficits.   Lab Results  Component  Value Date   CREATININE 0.95 06/27/2020   BUN 14 06/27/2020   NA 133 (L) 06/27/2020   K 3.9 06/27/2020   CL 100 06/27/2020   CO2 26 06/27/2020   Lab Results  Component Value Date   ALT 27 06/27/2020   AST 25 06/27/2020   GGT 22 06/27/2020   ALKPHOS 99 06/27/2020   BILITOT 0.4  06/27/2020   Lab Results  Component Value Date   HGBA1C 5.1 06/27/2020   HGBA1C 5.6 01/09/2020   HGBA1C 5.9 (H) 08/09/2019   HGBA1C 5.9 (H) 07/26/2018   Lab Results  Component Value Date   INSULIN 13.3 01/11/2020   INSULIN 12.6 08/09/2019   Lab Results  Component Value Date   TSH 0.657 08/09/2019   Lab Results  Component Value Date   CHOL 213 (H) 06/27/2020   HDL 62 06/27/2020   LDLCALC 144 (H) 06/27/2020   TRIG 33 06/27/2020   CHOLHDL 3.4 06/27/2020   Lab Results  Component Value Date   WBC 3.7 (L) 06/27/2020   HGB 12.2 06/27/2020   HCT 36.6 06/27/2020   MCV 87.1 06/27/2020   PLT 213 06/27/2020   No results found for: IRON, TIBC, FERRITIN  Attestation Statements:   Reviewed by clinician on day of visit: allergies, medications, problem list, medical history, surgical history, family history, social history, and previous encounter notes.   Wilhemena Durie, am acting as transcriptionist for [employer], PA-C.  I have reviewed the above documentation for accuracy and completeness, and I agree with the above. Abby Potash, PA-C

## 2020-08-29 ENCOUNTER — Telehealth: Payer: Self-pay | Admitting: Oncology

## 2020-08-29 NOTE — Telephone Encounter (Signed)
R/s appointments per 3/21 sch msg. Called pt, no answer. Left msg with appts dates and times.

## 2020-08-30 ENCOUNTER — Encounter (INDEPENDENT_AMBULATORY_CARE_PROVIDER_SITE_OTHER): Payer: Self-pay | Admitting: Physician Assistant

## 2020-08-30 MED FILL — OZEMPIC (1 MG/DOSE) 4 MG/3M: 4 | 84 days supply | Qty: 9 | Fill #0

## 2020-08-31 ENCOUNTER — Inpatient Hospital Stay: Payer: 59 | Attending: Adult Health

## 2020-08-31 ENCOUNTER — Other Ambulatory Visit: Payer: Self-pay

## 2020-08-31 DIAGNOSIS — Z170 Estrogen receptor positive status [ER+]: Secondary | ICD-10-CM | POA: Insufficient documentation

## 2020-08-31 DIAGNOSIS — R7303 Prediabetes: Secondary | ICD-10-CM

## 2020-08-31 DIAGNOSIS — Z006 Encounter for examination for normal comparison and control in clinical research program: Secondary | ICD-10-CM | POA: Diagnosis not present

## 2020-08-31 DIAGNOSIS — C50812 Malignant neoplasm of overlapping sites of left female breast: Secondary | ICD-10-CM | POA: Diagnosis not present

## 2020-08-31 DIAGNOSIS — E66813 Obesity, class 3: Secondary | ICD-10-CM

## 2020-08-31 DIAGNOSIS — C50912 Malignant neoplasm of unspecified site of left female breast: Secondary | ICD-10-CM

## 2020-08-31 DIAGNOSIS — C50212 Malignant neoplasm of upper-inner quadrant of left female breast: Secondary | ICD-10-CM

## 2020-09-01 LAB — FOLLICLE STIMULATING HORMONE: FSH: 8.5 m[IU]/mL

## 2020-09-03 NOTE — Telephone Encounter (Signed)
Please review

## 2020-09-04 ENCOUNTER — Other Ambulatory Visit: Payer: Self-pay | Admitting: *Deleted

## 2020-09-04 DIAGNOSIS — Z170 Estrogen receptor positive status [ER+]: Secondary | ICD-10-CM

## 2020-09-04 DIAGNOSIS — C50212 Malignant neoplasm of upper-inner quadrant of left female breast: Secondary | ICD-10-CM

## 2020-09-05 ENCOUNTER — Encounter (INDEPENDENT_AMBULATORY_CARE_PROVIDER_SITE_OTHER): Payer: Self-pay | Admitting: Physician Assistant

## 2020-09-05 ENCOUNTER — Other Ambulatory Visit: Payer: Self-pay

## 2020-09-05 ENCOUNTER — Ambulatory Visit (INDEPENDENT_AMBULATORY_CARE_PROVIDER_SITE_OTHER): Payer: 59 | Admitting: Physician Assistant

## 2020-09-05 VITALS — BP 135/79 | HR 83 | Temp 97.9°F | Ht 65.0 in | Wt 260.0 lb

## 2020-09-05 DIAGNOSIS — Z6841 Body Mass Index (BMI) 40.0 and over, adult: Secondary | ICD-10-CM

## 2020-09-05 DIAGNOSIS — E7849 Other hyperlipidemia: Secondary | ICD-10-CM | POA: Diagnosis not present

## 2020-09-07 LAB — ESTRADIOL, ULTRA SENS: Estradiol, Sensitive: <2.5 pg/mL

## 2020-09-11 ENCOUNTER — Ambulatory Visit: Payer: 59 | Admitting: Oncology

## 2020-09-11 NOTE — Progress Notes (Signed)
Chief Complaint:   OBESITY Mackenzie [name] is here to discuss her progress with her obesity treatment plan along with follow-up of her obesity related diagnoses. Mackenzie [name] is on the Category 3 Plan + 150 protein calories and states she is following her eating plan approximately 80% of the time. Mackenzie [name] states she is doing Zoom exercise for 45 minutes 1 time per week.  Today's visit was #: 72 Starting weight: 267 lbs Starting date: 08/09/2019 Today's weight: 260 lbs Today's date: 09/05/2020 Total lbs lost to date: 7 Total lbs lost since last in-office visit: 0  Interim History: Decline recently indulged during a co-worker's birthday party. She did not work out as often. She is ready to get back on track.  Subjective:   1. Other hyperlipidemia Decline is on fish oil, and no prescribed medications. Last lipid panel was not at goal.  Assessment/Plan:   1. Other hyperlipidemia Cardiovascular risk and specific lipid/LDL goals reviewed. We discussed several lifestyle modifications today. Mackenzie [name] will continue her meal plan, and will continue to work on exercise and weight loss efforts. Orders and follow up as documented in patient record.   Counseling Intensive lifestyle modifications are the first line treatment for this issue. . Dietary changes: Increase soluble fiber. Decrease simple carbohydrates. . Exercise changes: Moderate to vigorous-intensity aerobic activity 150 minutes per week if tolerated. . Lipid-lowering medications: see documented in medical record.  2. Class 3 severe obesity with serious comorbidity and body mass index (BMI) of 40.0 to 44.9 in adult, unspecified obesity type (HCC) Mackenzie [name] is currently in the action stage of change. As such, her goal is to continue with weight loss efforts. She has agreed to the Category 3 Plan + 150 protein calories.   Exercise goals: As is.  Behavioral modification strategies: meal planning and cooking strategies and keeping healthy foods in  the home.  Mackenzie [name] has agreed to follow-up with our clinic in 2 weeks. She was informed of the importance of frequent follow-up visits to maximize her success with intensive lifestyle modifications for her multiple health conditions.   Objective:   Blood pressure 135/79, pulse 83, temperature 97.9 F (36.6 C), height 5\' 5"  (1.651 m), weight 260 lb (117.9 kg), last menstrual period 02/10/2017, SpO2 98 %, unknown if currently breastfeeding. Body mass index is 43.27 kg/m.  General: Cooperative, alert, well developed, in no acute distress. HEENT: Conjunctivae and lids unremarkable. Cardiovascular: Regular rhythm.  Lungs: Normal work of breathing. Neurologic: No focal deficits.   Lab Results  Component Value Date   CREATININE 0.95 06/27/2020   BUN 14 06/27/2020   NA 133 (L) 06/27/2020   K 3.9 06/27/2020   CL 100 06/27/2020   CO2 26 06/27/2020   Lab Results  Component Value Date   ALT 27 06/27/2020   AST 25 06/27/2020   GGT 22 06/27/2020   ALKPHOS 99 06/27/2020   BILITOT 0.4 06/27/2020   Lab Results  Component Value Date   HGBA1C 5.1 06/27/2020   HGBA1C 5.6 01/09/2020   HGBA1C 5.9 (H) 08/09/2019   HGBA1C 5.9 (H) 07/26/2018   Lab Results  Component Value Date   INSULIN 13.3 01/11/2020   INSULIN 12.6 08/09/2019   Lab Results  Component Value Date   TSH 0.657 08/09/2019   Lab Results  Component Value Date   CHOL 213 (H) 06/27/2020   HDL 62 06/27/2020   LDLCALC 144 (H) 06/27/2020   TRIG 33 06/27/2020   CHOLHDL 3.4 06/27/2020   Lab Results  Component Value Date  WBC 3.7 (L) 06/27/2020   HGB 12.2 06/27/2020   HCT 36.6 06/27/2020   MCV 87.1 06/27/2020   PLT 213 06/27/2020   No results found for: IRON, TIBC, FERRITIN  Attestation Statements:   Reviewed by clinician on day of visit: allergies, medications, problem list, medical history, surgical history, family history, social history, and previous encounter notes.  Time spent on visit including pre-visit  chart review and post-visit care and charting was 30 minutes.    Wilhemena Durie, am acting as transcriptionist for [employer], PA-C.  I have reviewed the above documentation for accuracy and completeness, and I agree with the above. Abby Potash, PA-C

## 2020-09-12 NOTE — Progress Notes (Signed)
[facility] OFFICE PROGRESS NOTE  Mackenzie Rasmussen, MD 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 female returns to clinic today for a follow-up visit.  The patient was last seen by Dr. Jana Hakim on 06/27/2020. She continues to take letrozole and ribociclib daily. At that appointment, the patient was endorsing nausea without vomiting. This has improved at this time.  She also was reporting vaginal dryness and hot flashes, which she still has occasionally. She had lab studies performed to assess her menopause status. Her [place] had been on hold since October 2021. Dr. Jana Hakim noted that she does not need goserelin at present since her estradiol continues to be suppressed.  He believes in the next 3 to 6 months we will know definitively as the [facility] rises whether the ovaries will respond or not. She denies any other spotting or menstrual bleeding since her last appointment.    Her most recent bone density screening on 03/16/2018, showed a T-score of -0.7, which is considered normal.    Her most recent mammography was 10/12/2019, showing density category C with no evidence of malignancy  Otherwise, she denies any recent fever, chills, cough, shortness of breath, skin infections, or dysuria. She sometimes has dry mouth and phlegm. She takes zyrtec for allergies and Singulair.  She denies any diarrhea. She sometimes has constipation.  She denies any rashes or skin changes.  She denies any abdominal pain. Denies edema. She is here today for evaluation and repeat blood work.                 COVID 19 VACCINATION STATUS: Status post Pfizer x2 with booster September 2021   HISTORY OF CURRENT ILLNESS: From the original intake note:  The patient herself noted some changes in her left breast late  July 2018, she says, and eventually brought this to medical attention so that on 01/09/2017 she underwent bilateral diagnostic mammography with tomography and left breast ultrasonography at the breast Center. This found the breast density to be category C. In the upper inner quadrant of the left breast there was an area of asymmetry and there were malignant type calcifications involving all 4 quadrants. On exam there is firmness and palpable thickening in the anterior left breast with skin dimpling. Ultrasonography found at the 9:30 o'clock radiant 3 cm from the nipple a 2.7 cm mass and in the left axilla for abnormal lymph nodes largest of which measured 2.7 cm.  Biopsy of the left breast 9:30 o'clock mass 80 01/26/2017 showed ([id number]) invasive ductal carcinoma, with extracellular mucin, grade 1 or 2. In the lower outer left breast is separated biopsy the same day showed ductal carcinoma in situ. One of the 4 lymph nodes involved was positive for metastatic carcinoma. Prognostic panel on the invasive disease showed it to be estrogen receptor 100% positive, progesterone receptor 20% positive, both with strong staining intensity, with an MIB-1 of 20%, and no HER-2 amplification with a signals ratio 1.38 and the number per cell 2.90.  On 01/22/2017 the patient underwent bilateral breast MRI. This showed no involvement of the right breast, but in the left breast there was a masslike and non-masslike enhancement involving all quadrants, with skin swelling but no abnormal enhancement of the skin or nipple areolar complex or pectoralis muscle. The mass could not be clearly measured  but spanned approximately 12 cm. There was bulky left axillary lymphadenopathy, with the largest lymph node measuring up to 3.4 cm.  The patient's subsequent history is as detailed below.  MEDICAL HISTORY: Past Medical History:  Diagnosis Date  . Abnormal glucose 2018  . 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 Allergic  rhinitis 2012  . Anemia 01/27/2017   prior to starting chemotherapy  . Breast cancer ([place]) 01/14/2017   Left breast  . 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 Edema, lower extremity   . Hot flashes 03/2017  . Hyperlipidemia 03/20/2016  . Morbid obesity with body mass index (BMI) of 40.0 to 49.9 ([place]) 09/17/2017  . NCGS (non-celiac gluten sensitivity)   . Pre-diabetes 07/26/2018   Hgb A1C elevated on 07/26/2018, Gestational Diabetes 2012  . Seasonal allergies 2012   seasonal allergies causes allergic rhinitis and itchy, dry eyes per pt  . Vitamin D deficiency 05/2015    ALLERGIES:  is allergic to dilaudid [hydromorphone].  MEDICATIONS:  Current Outpatient Medications  Medication Sig Dispense Refill  . acetaminophen (TYLENOL) 500 MG tablet Take 1,000 mg by mouth every 6 (six) hours as needed (for pain/headaches.).     Marland Kitchen B Complex Vitamins (VITAMIN B COMPLEX PO) Take 1 capsule by mouth 2 (two) times daily.     . budesonide-formoterol (SYMBICORT) 80-4.5 MCG/ACT inhaler Inhale 2 puffs into the lungs every morning.     . Calcium Carb-Cholecalciferol (CALCIUM 600+D3 PO) Take 1 tablet by mouth 2 (two) times daily.     . cetirizine (ZYRTEC) 10 MG tablet Take 10 mg by mouth daily.    Marland Kitchen gabapentin (NEURONTIN) 300 MG capsule TAKE 1 CAPSULE (300 MG TOTAL) BY MOUTH AT BEDTIME. 90 capsule 4  . hydroxypropyl methylcellulose / hypromellose (ISOPTO TEARS / GONIOVISC) 2.5 % ophthalmic solution Place 1 drop into both eyes daily as needed for dry eyes.     Marland Kitchen ibuprofen (ADVIL,MOTRIN) 200 MG tablet Take 800 mg by mouth every 8 (eight) hours as needed for mild pain (for pain.).     Marland Kitchen influenza vac split quadrivalent PF (FLUARIX) 0.5 ML injection TO BE ADMINISTERED BY PHARMACIST (Patient taking differently: TO BE ADMINISTERED BY PHARMACIST) .5 mL 0  . Investigational ribociclib (KISQALI) 200 MG tablet [name] Take 2 tablets (400 mg total) by mouth daily. Take 2  tablets ($RemoveB'400mg'QwSFcuXE$  total) by mouth daily on days 1-21.  Repeat every 28 days. 225 tablet 0  . letrozole (FEMARA) 2.5 MG tablet TAKE 1 TABLET BY MOUTH DAILY. 90 tablet 4  . loratadine (CLARITIN) 10 MG tablet Take 1 tablet (10 mg total) by mouth daily. (Patient taking differently: Take 10 mg by mouth daily as needed for allergies.) 90 tablet 2  . metFORMIN (GLUCOPHAGE) 500 MG tablet TAKE 1 TABLET (500 MG TOTAL) BY MOUTH 2 (TWO) TIMES DAILY WITH A MEAL. 180 tablet 0  . Multiple Vitamin (MULTIVITAMIN WITH MINERALS) TABS tablet Take 1 tablet by mouth daily.     . Omega-3 Fatty Acids (FISH OIL) 1000 MG CAPS Take 1,000 mg by mouth 2 (two) times daily.     . phenylephrine (SUDAFED PE) 10 MG TABS tablet Take 10 mg by mouth every 4 (four) hours as needed.     . Probiotic Product (PROBIOTIC DAILY PO) Take 1 capsule by mouth 2 (two) times daily.     . Semaglutide, 1 MG/DOSE, 4 MG/3ML SOPN INJECT 1 MG WEEKLY 27 mL 0  . Turmeric 450 MG CAPS  Take 450 mg by mouth 2 (two) times daily.    . Vitamin D, Ergocalciferol, (DRISDOL) 1.25 MG (50000 UNIT) CAPS capsule TAKE 1 CAPSULE (50,000 UNITS TOTAL) BY MOUTH EVERY 14 DAYS 6 capsule 0  . ZINC OXIDE PO Take 1 tablet by mouth 2 (two) times a day.     No current facility-administered medications for this visit.   Facility-Administered Medications Ordered in Other Visits  Medication Dose Route Frequency Provider Last Rate Last Admin  . sodium chloride flush (NS) 0.9 % injection 10 mL  10 mL Intravenous PRN Magrinat, Valentino Hue, MD   10 mL at 03/10/17 1239    SURGICAL HISTORY:  Past Surgical History:  Procedure Laterality Date  . BREAST RECONSTRUCTION WITH PLACEMENT OF TISSUE EXPANDER AND FLEX HD (ACELLULAR HYDRATED DERMIS) Left 08/27/2017   Procedure: LEFT BREAST RECONSTRUCTION WITH PLACEMENT OF TISSUE EXPANDER AND FLEX HD;  Surgeon: Peggye Form, DO;  Location: [facility] OR;  Service: Plastics;  Laterality: Left;  . CESAREAN SECTION     x2  . LATISSIMUS FLAP TO BREAST  Left 11/03/2018  . LATISSIMUS FLAP TO BREAST Left 11/03/2018   Procedure: LATISSIMUS FLAP TO LEFT BREAST;  Surgeon: Peggye Form, DO;  Location: [facility] OR;  Service: Plastics;  Laterality: Left;  Marland Kitchen MASTECTOMY MODIFIED RADICAL Left 08/27/2017  . MASTECTOMY MODIFIED RADICAL Left 08/27/2017   Procedure: LEFT MODIFIED RADICAL MASTECTOMY;  Surgeon: Harriette Bouillon, MD;  Location: [facility] OR;  Service: General;  Laterality: Left;  Marland Kitchen MASTOPEXY Right 03/03/2019   Procedure: RIGHT BREAST MASTOPEXY/REDUCTION;  Surgeon: Peggye Form, DO;  Location: [facility];  Service: Plastics;  Laterality: Right;  3 hours, please  . PORT-A-CATH REMOVAL Right 08/27/2017   Procedure: REMOVAL PORT-A-CATH RIGHT CHEST;  Surgeon: Harriette Bouillon, MD;  Location: [facility];  Service: General;  Laterality: Right;  . PORTACATH PLACEMENT Right 01/28/2017   Procedure: INSERTION PORT-A-CATH WITH ULTRASOUND;  Surgeon: Harriette Bouillon, MD;  Location: [facility];  Service: General;  Laterality: Right;  . REDUCTION MAMMAPLASTY Right   . REMOVAL OF TISSUE EXPANDER AND PLACEMENT OF IMPLANT Left 03/03/2019   Procedure: REMOVAL OF TISSUE EXPANDER AND PLACEMENT OF EXPANDER;  Surgeon: Peggye Form, DO;  Location: [facility];  Service: Plastics;  Laterality: Left;  . REMOVAL OF TISSUE EXPANDER AND PLACEMENT OF IMPLANT Left 05/25/2019   Procedure: LEFT BREAST REMOVAL OF TISSUE EXPANDER AND PLACEMENT OF IMPLANT;  Surgeon: Peggye Form, DO;  Location: [facility];  Service: Plastics;  Laterality: Left;  . TISSUE EXPANDER PLACEMENT Left 11/03/2018   Procedure: PLACEMENT OF TISSUE EXPANDER LEFT BREAST;  Surgeon: Peggye Form, DO;  Location: [facility];  Service: Plastics;  Laterality: Left;  Total case time is 3.5 hours  . TUBAL LIGATION Bilateral 01/21/2011    The patient's father still alive at age 41. The patient's mother died with a brain tumor which the patient says  was "benign". She was 56. The patient has one brother, no sisters. The only breast cancer in the family is a paternal grandmother who died from breast cancer at an unknown age. There is no history of ovarian or prostate cancer in the family.   GYNECOLOGIC HISTORY:  Patient's last menstrual period was 02/10/2017. Menarche age 29 and first live birth age 3 she is GX P4. The patient is still having regular periods as of August 2018   SOCIAL HISTORY: (Updated 05/31/2018) Mackenzie [name] works at [name] on the fifth floor, [place]. She  is separated from her husband (as of 05/2018), Mackenzie [name], who is a Building control surveyor.  1 daughter is married and lives in [place] and works as a Education administrator. Danyale's second daughter, Mackenzie [name], lives in [place] and works in the police department. Daughters, Mackenzie [name] and Mackenzie [name] are 2 and 6, living at home. The patient has a grandson she looks after. She attends a [employer].                           ADVANCED DIRECTIVES: At the 05/03/2020 visit the patient tells me she has named her daughter Mackenzie [name] as her healthcare power of attorney.  Mackenzie [name] may be reached at [phone]  REVIEW OF SYSTEMS:   Review of Systems  Constitutional: Positive for occasional hot flashes. Negative for appetite change, chills, fatigue, fever and unexpected weight change.  HENT: Positive for dry mouth. Positive for occasional post nasal drainage. Negative for mouth sores, nosebleeds, sore throat and trouble swallowing.   Eyes: Negative for eye problems and icterus.  Respiratory: Negative for cough, hemoptysis, shortness of breath and wheezing.   Cardiovascular: Negative for chest pain and leg swelling.  Gastrointestinal: Negative for abdominal pain, constipation, diarrhea, nausea and vomiting.  Genitourinary: Positive for vaginal dryness. Negative for bladder incontinence, difficulty urinating, dysuria, frequency and hematuria.   Musculoskeletal: Negative for back pain, gait  problem, neck pain and neck stiffness.  Skin: Negative for itching and rash.  Neurological: Negative for dizziness, extremity weakness, gait problem, headaches, light-headedness and seizures.  Hematological: Negative for adenopathy. Does not bruise/bleed easily.  Psychiatric/Behavioral: Negative for confusion, depression and sleep disturbance. The patient is not nervous/anxious.     PHYSICAL EXAMINATION:  Blood pressure 127/78, pulse 79, temperature 97.9 F (36.6 C), temperature source Tympanic, resp. rate 18, height $RemoveBe'5\' 5"'[id number]$  (1.651 m), weight 260 lb 1.6 oz (118 kg), last menstrual period 02/10/2017, SpO2 99 %, unknown if currently breastfeeding.  ECOG PERFORMANCE STATUS: 0 - Asymptomatic  Physical Exam  Constitutional: Oriented to person, place, and time and well-developed, well-nourished, and in no distress.  HENT:  Head: Normocephalic and atraumatic.  Mouth/Throat: Oropharynx is clear and moist. No oropharyngeal exudate.  Eyes: Conjunctivae are normal. Right eye exhibits no discharge. Left eye exhibits no discharge. No scleral icterus.  Neck: Normal range of motion. Neck supple.  Cardiovascular: Normal rate, regular rhythm, normal heart sounds and intact distal pulses.   Pulmonary/Chest: Effort normal and breath sounds normal. No respiratory distress. No wheezes. No rales.  Abdominal: Soft. Bowel sounds are normal. Exhibits no distension and no mass. There is no tenderness.  Musculoskeletal: Normal range of motion. Exhibits no edema.  Lymphadenopathy:    No cervical adenopathy.  Neurological: Alert and oriented to person, place, and time. Exhibits normal muscle tone. Gait normal. Coordination normal.  Skin: Skin is warm and dry. No rash noted. Not diaphoretic. No erythema. No pallor.  Psychiatric: Mood, memory and judgment normal.  Breast: The right breast is status post reduction mammoplasty.  It is otherwise unremarkable.  The left breast is status post mastectomy with latissimus flap  reconstruction.  There is significant asymmetry and some tightness.  There is no evidence of local recurrence.  Both axillae are benign. Vitals reviewed.  LABORATORY DATA: Lab Results  Component Value Date   WBC 4.5 09/17/2020   HGB 12.2 09/17/2020   HCT 37.1 09/17/2020   MCV 88.5 09/17/2020   PLT 253 09/17/2020      Chemistry      Component Value  Date/Time   NA 136 09/17/2020 0819   NA 136 08/09/2019 1241   NA 134 (L) 06/05/2017 1052   K 3.7 09/17/2020 0819   K 3.5 06/05/2017 1052   CL 103 09/17/2020 0819   CO2 22 09/17/2020 0819   CO2 25 06/05/2017 1052   BUN 15 09/17/2020 0819   BUN 13 08/09/2019 1241   BUN 12.0 06/05/2017 1052   CREATININE 0.91 09/17/2020 0819   CREATININE 0.9 06/05/2017 1052      Component Value Date/Time   CALCIUM 9.4 09/17/2020 0819   CALCIUM 9.3 06/05/2017 1052   ALKPHOS 117 09/17/2020 0819   ALKPHOS 95 06/05/2017 1052   AST 18 09/17/2020 0819   AST 19 06/05/2017 1052   ALT 24 09/17/2020 0819   ALT 28 06/05/2017 1052   BILITOT 0.3 09/17/2020 0819   BILITOT 0.26 06/05/2017 1052       RADIOGRAPHIC STUDIES:  No results found.   ASSESSMENT/PLAN:  ELIGIBLE FOR AVAILABLE RESEARCH PROTOCOL: [name], ASA study   ASSESSMENT: 50 y.o. [place] woman status post left breast upper inner quadrant biopsy 01/14/2017 for a clinical T3 N2, stage IIA invasive ductal carcinoma, grade 1 or 2, estrogen and progesterone receptor positive, HER-2 nonamplified, with an MIB-1 of 20%.  (1) staging studies: Brain MRI, bone scan, and CT scan of the chest 02/05/2017 showed no brain lesions, no lung or liver lesions, a 4.9 cm mass in the left breast with left axillary and subpectoral adenopathy, and nonspecific bone scan tracer at L2, left scapula, and anterior ribs, with lumbar spine MRI suggested for further evaluation.             (a) lumbar spine MRI 02/17/2017 showed no normal bone lesions.  There was mild lumbar spondylosis  (2) neoadjuvant chemotherapy  consisting of cyclophosphamide and doxorubicin in dose dense fashion 4 started 02/10/2017, completed 03/24/2017, followed by weekly carboplatin and gemcitabine given days 1 and 8 of each 21-day cycle starting 04/14/2017, completing the planned 4 cycles 06/26/2017  (3) is post left modified radical mastectomy on 08/27/2017 showing an mpT3 pN2 residual invasive ductal carcinoma, grade 2, with a residual cancer burden of 3.  Margins were clear             (a) latissimus flap reconstruction with expander 11/03/2018  (4) postmastectomy radiation completed 01/01/2018             (a) capecitabine radiosensitization 11/16/2017-01/01/2018  (5) goserelin started 09/21/2017             (a) letrozole started 01/11/2018             (b) enrolled in [facility] clinical trial, randomized to ribociclib on 05/31/2018             (c) Bone density on 03/16/2018 was normal with T score of -0.7 in the L1-L4 spine  (6) genetics testing 03/24/2017 through the Common Hereditary Cancer Panel offered by [facility] found no deleterious mutations in APC, ATM, AXIN2, BARD1, BMPR1A, BRCA1, BRCA2, BRIP1, CDH1, CDKN2A (p14ARF), CDKN2A (p16INK4a), CHEK2, CTNNA1, DICER1, EPCAM (Deletion/duplication testing only), GREM1 (promoter region deletion/duplication testing only), KIT, MEN1, MLH1, MSH2, MSH3, MSH6, MUTYH, NBN, NF1, NHTL1, PALB2, PDGFRA, PMS2, POLD1, POLE, PTEN, RAD50, RAD51C, RAD51D, SDHB, SDHC, SDHD, SMAD4, SMARCA4. STK11, TP53, TSC1, TSC2, and VHL.  The following genes were evaluated for sequence changes only: SDHA and HOXB13 c.251G>A variant only.    (7) Right breast reduction and left breast expander placement on 03/03/2019 with latissimus flap placement             (  a) left implant implant exchanged on 05/25/2019 or a Mentor Smooth RoundUltraHigh Profile Gel750cc. Ref #[id number]. Serial Number [id number]  PLAN: Lillymae is now close to 3 years out from definitive surgery for her breast cancer with no evidence of  disease recurrence. Per Dr. [name] last note, I will arrange for her next mammogram to be performed in May 2022. We will find out if she needs a repeat DEXA scan in the interval until her next appointment.   She is tolerating the letrozole and ribociclib without any significant side effects and we are continuing as per the [name] trial. Labs adequate to continue on the same treatment at the same dose.   We will see her back for a follow up visit in 3 months.    The patient was advised to call immediately if she has any concerning symptoms in the interval. The patient voices understanding of current disease status and treatment options and is in agreement with the current care plan. All questions were answered. The patient knows to call the clinic with any problems, questions or concerns. We can certainly see the patient much sooner if necessary    Orders Placed This Encounter  Procedures  . MM 3D SCREEN BREAST UNI RIGHT    Standing Status:   Future    Standing Expiration Date:   09/18/2021    Order Specific Question:   Reason for Exam (SYMPTOM  OR DIAGNOSIS REQUIRED)    Answer:   Hx breast cancer, treated left breast cancer, status post left mastectomy and reduction mammoplasy of right breast    Order Specific Question:   Is the patient pregnant?    Answer:   No    Order Specific Question:   Preferred imaging location?    Answer:   [facility]     I spent 20-29 minutes in this encounter.   Damario Gillie L Sherrye Puga, PA-C 09/18/20

## 2020-09-14 ENCOUNTER — Other Ambulatory Visit: Payer: 59

## 2020-09-17 ENCOUNTER — Other Ambulatory Visit (HOSPITAL_COMMUNITY): Payer: Self-pay

## 2020-09-17 ENCOUNTER — Ambulatory Visit: Payer: 59 | Admitting: Oncology

## 2020-09-17 ENCOUNTER — Other Ambulatory Visit: Payer: Self-pay

## 2020-09-17 ENCOUNTER — Encounter: Payer: Self-pay | Admitting: Radiology

## 2020-09-17 ENCOUNTER — Other Ambulatory Visit: Payer: 59

## 2020-09-17 ENCOUNTER — Inpatient Hospital Stay: Payer: 59 | Attending: Adult Health

## 2020-09-17 DIAGNOSIS — C50212 Malignant neoplasm of upper-inner quadrant of left female breast: Secondary | ICD-10-CM

## 2020-09-17 DIAGNOSIS — C50812 Malignant neoplasm of overlapping sites of left female breast: Secondary | ICD-10-CM | POA: Insufficient documentation

## 2020-09-17 LAB — LACTATE DEHYDROGENASE: LDH: 164 U/L (ref 98–192)

## 2020-09-17 LAB — CMP (CANCER CENTER ONLY)
ALT: 24 U/L (ref 0–44)
AST: 18 U/L (ref 15–41)
Albumin: 3.8 g/dL (ref 3.5–5.0)
Alkaline Phosphatase: 117 U/L (ref 38–126)
Anion gap: 11 (ref 5–15)
BUN: 15 mg/dL (ref 6–20)
CO2: 22 mmol/L (ref 22–32)
Calcium: 9.4 mg/dL (ref 8.9–10.3)
Chloride: 103 mmol/L (ref 98–111)
Creatinine: 0.91 mg/dL (ref 0.44–1.00)
GFR, Estimated: >60 mL/min (ref >60)
Glucose, Bld: 89 mg/dL (ref 70–99)
Potassium: 3.7 mmol/L (ref 3.5–5.1)
Sodium: 136 mmol/L (ref 135–145)
Total Bilirubin: 0.3 mg/dL (ref 0.3–1.2)
Total Protein: 8 g/dL (ref 6.5–8.1)

## 2020-09-17 LAB — CBC WITH DIFFERENTIAL (CANCER CENTER ONLY)
Abs Immature Granulocytes: 0.01 10*3/uL (ref 0.00–0.07)
Basophils Absolute: 0 10*3/uL (ref 0.0–0.1)
Basophils Relative: 1 %
Eosinophils Absolute: 0.1 10*3/uL (ref 0.0–0.5)
Eosinophils Relative: 1 %
HCT: 37.1 % (ref 36.0–46.0)
Hemoglobin: 12.2 g/dL (ref 12.0–15.0)
Immature Granulocytes: 0 %
Lymphocytes Relative: 29 %
Lymphs Abs: 1.3 10*3/uL (ref 0.7–4.0)
MCH: 29.1 pg (ref 26.0–34.0)
MCHC: 32.9 g/dL (ref 30.0–36.0)
MCV: 88.5 fL (ref 80.0–100.0)
Monocytes Absolute: 0.5 10*3/uL (ref 0.1–1.0)
Monocytes Relative: 12 %
Neutro Abs: 2.5 10*3/uL (ref 1.7–7.7)
Neutrophils Relative %: 57 %
Platelet Count: 253 10*3/uL (ref 150–400)
RBC: 4.19 MIL/uL (ref 3.87–5.11)
RDW: 14 % (ref 11.5–15.5)
WBC Count: 4.5 10*3/uL (ref 4.0–10.5)
nRBC: 0 % (ref 0.0–0.2)

## 2020-09-17 LAB — PHOSPHORUS: Phosphorus: 3.7 mg/dL (ref 2.5–4.6)

## 2020-09-17 LAB — MAGNESIUM: Magnesium: 1.8 mg/dL (ref 1.7–2.4)

## 2020-09-17 LAB — LIPASE, BLOOD: Lipase: 35 U/L (ref 11–51)

## 2020-09-17 LAB — URIC ACID: Uric Acid, Serum: 3.8 mg/dL (ref 2.5–7.1)

## 2020-09-17 LAB — GAMMA GT: GGT: 29 U/L (ref 7–50)

## 2020-09-17 LAB — BILIRUBIN, DIRECT: Bilirubin, Direct: <0.1 mg/dL (ref 0.0–0.2)

## 2020-09-17 LAB — AMYLASE: Amylase: 78 U/L (ref 28–100)

## 2020-09-17 MED FILL — Letrozole Tab 2.5 MG: ORAL | 90 days supply | Qty: 90 | Fill #0 | Status: AC

## 2020-09-17 NOTE — Progress Notes (Signed)
A Trial to Evaluate Efficacy and Safety of Ribociclib With Endocrine Therapy as Adjuvant Treatment in Patients With HR+/HER2- Early Breast Cancer ([name])  09/17/20      8:15AM  LAB VISIT: Met with Mackenzie [name] in the waiting area just prior to her lab appointment. Spoke with lab tech, Mackenzie [name], drawing labs for this visit and confirmed to only draw research lab orders placed on 09/17/2020. Lab tech verbalized that she was aware and saw the note in Epic. Thanked Mackenzie [name] and lab tech for their time.   Mackenzie [name], RT(R)(T) Clinical Research Coordinator

## 2020-09-18 ENCOUNTER — Encounter (INDEPENDENT_AMBULATORY_CARE_PROVIDER_SITE_OTHER): Payer: Self-pay

## 2020-09-18 ENCOUNTER — Encounter: Payer: Self-pay | Admitting: Physician Assistant

## 2020-09-18 ENCOUNTER — Other Ambulatory Visit: Payer: Self-pay | Admitting: Oncology

## 2020-09-18 ENCOUNTER — Inpatient Hospital Stay: Payer: 59 | Admitting: Physician Assistant

## 2020-09-18 ENCOUNTER — Encounter: Payer: Self-pay | Admitting: *Deleted

## 2020-09-18 VITALS — BP 127/78 | HR 79 | Temp 97.9°F | Resp 18 | Ht 65.0 in | Wt 260.1 lb

## 2020-09-18 DIAGNOSIS — C50812 Malignant neoplasm of overlapping sites of left female breast: Secondary | ICD-10-CM

## 2020-09-18 DIAGNOSIS — Z006 Encounter for examination for normal comparison and control in clinical research program: Secondary | ICD-10-CM

## 2020-09-18 DIAGNOSIS — C50212 Malignant neoplasm of upper-inner quadrant of left female breast: Secondary | ICD-10-CM

## 2020-09-18 DIAGNOSIS — Z170 Estrogen receptor positive status [ER+]: Secondary | ICD-10-CM

## 2020-09-18 DIAGNOSIS — E559 Vitamin D deficiency, unspecified: Secondary | ICD-10-CM

## 2020-09-18 DIAGNOSIS — C50912 Malignant neoplasm of unspecified site of left female breast: Secondary | ICD-10-CM

## 2020-09-18 NOTE — Research (Signed)
[name] Cycle 31 Day 2  09/18/20 9:45am  [name] arrived today unaccompanied for her scheduled Cycle 31 visit on the [name] protocol. Ruben Im RN accompanied this RN on this research visit.  Labs: Lab work was drawn, per protocol, yesterday 09/17/20. Patient confirms she was fasting as directed. Bergoo and Estradiol were drawn 2 weeks ago and results were available for review. All lab results reviewed by Dr. Jana Hakim.   Corrected Calcium Formula:  4 g/dL utilized for "normal albumin;" local normal range is 3.5-5 g/dL. Corrected Calcium = (0.8 x [normal albumin - patient's albumin]) + serum calcium Correct Calcium = (0.8 x[4-3.8]) + 9.4 Corrected Calcium = 9.56 g.dL  PROs:  Given to patient to complete prior to exam. Collected and checked for completeness and accuracy.   Vital Signs: Patient was seated for five minutes prior to measurement of vital signs. See VS Flowsheet.   Healthcare Resource Utilization: Patient has not been admitted to any healthcare facility since her last visit.  Concomitant Medications: Current medication list reviewed with patient. Patient reports "someone told me to stop taking ibuprofen" and she has not taken since the beginning of this year. She cannot remember why she was told to stop or who told her but she it taking only acetaminophen now prn for pain. Patient states she also stopped taking Turmeric at the beginning of this year.   Adverse Events:  Patient denies any nausea since last study visit. Patient reports she cannot remember having any left neck or shoulder pain since the beginning of this year. Patient does not think her hair loss (alopecia) is any more than normal hair loss at this point at least since her last study visit. Patient denies feeling any heaviness or numbness in left leg since last study visit.  Patient confirmed other previously noted AEs are ongoing, without change.  These are listed in AE table below as ongoing.  Patient  denies any new AEs this visit.       Physical Exam and Performance Status:  Patient examined today by Cassie Heilingoetter, PA-C. Please see her office visit encounter for related notes.  Patient does not require any dose modification per protocol, will continue ribociclib at 400 mg. Dr. Jana Hakim is in agreement. He instructed okay to start this cycle of ribociclib and to continue to hold goserelin.  Patient denies any menstrual bleeding and understands to notify Dr. Jana Hakim and/or research nurse if she starts to have any vaginal bleeding.    Investigational Medication:  Patient returned her completed medication diaries and bottles for cycles 28-30. Patient returned the following kits, drug count confirmed by Kennith Center, PharmD: Kit [id number],  7 pills remaining indicating 2 missed doses at the beginning of this cycle. This was known at previous visit due to delayed start of cycle since previous study visit was delayed by 2 days for inclement weather. Patient diary on Cycle 28 concurs with this. Patient was 100% compliant with dosing this cycle. Kit [id number], 3 pills remaining which was expected and patient diary cycle 29 concurs with 100% compliance on dosing. Kit #[id number]  3 pills remaining which was expected and patient diary cycle 30 concurs with 100% compliance on dosing. Medication diaries showed no missed doses of letrozole and patient confirmed she was 100% compliance on dosing with letrozole.  Visit was logged in the IRT system and the following kits were dispensed to the patient: [id number], [id number], [id number] for cycles 31-33. Patient verbalized understanding of how to take medications.  Patient  was also provided with drug diaries for cycles 31-33. Patient took prescribed dose of ribociclib and letrozole in clinic at 10:22 am 09/18/20 and recorded doses in medication diary.   Visit Scheduling - Patient will return in 3 months for her next trial visit. Cycle 34 Day 1 falls on Monday July 4th. Clinic  is closed due to holiday so next study lab will be requested for 12/11/20 with provider visit on 12/12/20.  This is within the protocol allowed window for this visit. Patient will also be scheduled to return 2 weeks prior to next study visit to draw [facility] and Estradiol since it can take up to 2 weeks for results. Patient in agreement.  Patient aware that she should contact the clinical at any time if she has any questions or concerns prior to her next visit.  Patient thanked for her ongoing participation in the [employer] trial.  AE Table: Event Grade Onset Date End Date Status Comments Attribution to Ribociclib Attribution to Letrozole Attribution to Goserelin  Allergic Rhinitis 2 2012  ongoing At Baseline n/a n/a n/a  Constipation 1 08/27/17  ongoing At Baseline n/a n/a n/a  Cough, intermittent 2 05/09/18  ongoing At Baseline n/a n/a n/a  GERD, intermittent 1 2018  ongoing At Baseline n/a n/a n/a  Gluten Sensitivity 1 12/2017  ongoing At Baseline n/a n/a n/a  Headaches 1 02/2017  ongoing At Baseline n/a n/a n/a  Hot Flashes 2 03/2017 04/02/20 ongoing At Baseline n/a n/a n/a  Hot Flashes 1 04/02/20  ongoing      Hyperglycemia 1 2018  ongoing At Baseline n/a n/a n/a  Hyperlipidemia 1 03/2016  ongoing At Baseline n/a n/a n/a  Vitamin D Deficiency 2 05/2015  ongoing At Baseline n/a n/a n/a  Arthralgias bilat legs 1 10/01/18  ongoing At Baseline     Bilateral foot pain/ Pes Planus 2   ongoing At Baseline, recently needed new orthotics     Fatigue 1 06/28/18  ongoing   Yes No No  Hypertension  1-3 2012  ongoing At Baseline. Intermittent grade 1 - 3 recorded since 2012. Changed to History at Baseline.      Left Shoulder pain 2 09/24/19 06/09/20 resolved Ibuprofen PRN No No No  Left Neck pain 2 09/24/19 06/09/20 resolved Ibuprofen PRN No No No  Hepatic Cysts 1 04/04/19  ongoing [name] 04/04/19 & MRI 07/06/19   No No No  Cholelithiasis 1 04/04/19  ongoing [name] 04/04/19 & MRI 07/06/19 No No  No  Neuropathy bilateral feet 1 10/08/19  recurred Resolved and then recurred. Gabapentin Yes No No  Dyspnea on exertion 1 08/09/19  ongoing going upstairs No No No  Lymphedema bilateral arms 1 07/25/19  ongoing PHT and Sleeves No No No  Bilateral Knee Pain 2 07/11/19  ongoing Intermittent. PHT and steroid injections No No No  Nail Infection (fungus on toenails) 1 10/08/19  ongoing  No No No  Vaginal Dryness 1 10/17/19  ongoing  No Yes Yes  Alopecia 1 12/23/19 06/27/20 resolved  No Yes Yes  Left Leg Heavy and Numb, Musculoskeletal, Other 1 01/09/20 06/27/20 resolved  No No No  Anemia 1 04/02/20 09/17/20 resolved  Yes No No  Nausea 1 06/13/2020 06/27/20 resolved untreated No No No  Pre-Diabetes 2 07/26/18  ongoing Elevated Hgb A1C, started metformin No  No No    Mackenzie [name], BSN, [employer]

## 2020-09-18 NOTE — Progress Notes (Signed)
Mackenzie [name] seems most recent labs show a [place] of 8.5, but an estradiol of less than 2.5.  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  It is not conclusive for definitive menopause and we will need to continue to follow these labs every 3 months.  It is however okay to continue her current treatment, without goserelin.

## 2020-09-19 ENCOUNTER — Ambulatory Visit (INDEPENDENT_AMBULATORY_CARE_PROVIDER_SITE_OTHER): Payer: 59 | Admitting: Physician Assistant

## 2020-09-19 ENCOUNTER — Encounter: Payer: Self-pay | Admitting: Oncology

## 2020-09-19 ENCOUNTER — Telehealth: Payer: Self-pay | Admitting: Oncology

## 2020-09-19 NOTE — Telephone Encounter (Signed)
Last seen by Olivia Mackie

## 2020-09-19 NOTE — Telephone Encounter (Signed)
Scheduled appts per 4/12 sch msg. Called pt, no answer and vm was full. Mailed updated calendar to pt.

## 2020-09-20 ENCOUNTER — Other Ambulatory Visit: Payer: Self-pay

## 2020-09-20 ENCOUNTER — Encounter (INDEPENDENT_AMBULATORY_CARE_PROVIDER_SITE_OTHER): Payer: Self-pay | Admitting: Family Medicine

## 2020-09-20 ENCOUNTER — Ambulatory Visit (INDEPENDENT_AMBULATORY_CARE_PROVIDER_SITE_OTHER): Payer: 59 | Admitting: Family Medicine

## 2020-09-20 ENCOUNTER — Other Ambulatory Visit: Payer: Self-pay | Admitting: Oncology

## 2020-09-20 VITALS — BP 121/80 | HR 92 | Temp 98.6°F | Ht 65.0 in | Wt 256.0 lb

## 2020-09-20 DIAGNOSIS — R7303 Prediabetes: Secondary | ICD-10-CM | POA: Diagnosis not present

## 2020-09-20 DIAGNOSIS — Z6841 Body Mass Index (BMI) 40.0 and over, adult: Secondary | ICD-10-CM

## 2020-09-20 MED ORDER — INV-RIBOCICLIB 200 MG TAB NATALEE TRIO003 STUDY: 400.0000 mg | ORAL_TABLET | Freq: Every day | ORAL | 0 refills | Status: DC

## 2020-09-22 ENCOUNTER — Other Ambulatory Visit (HOSPITAL_COMMUNITY): Payer: Self-pay

## 2020-09-22 MED FILL — Gabapentin Cap 300 MG: ORAL | 90 days supply | Qty: 90 | Fill #0 | Status: AC

## 2020-09-25 ENCOUNTER — Encounter (INDEPENDENT_AMBULATORY_CARE_PROVIDER_SITE_OTHER): Payer: Self-pay | Admitting: Family Medicine

## 2020-09-25 NOTE — Progress Notes (Signed)
Chief Complaint:   OBESITY Mackenzie [name] is here to discuss her progress with her obesity treatment plan along with follow-up of her obesity related diagnoses. Mackenzie [name] is on the Category 3 Plan + 150 protein snack calories and states she is following her eating plan approximately 80-85% of the time. Mackenzie [name] states she is doing zumba/walking for 45 minutes 3 times per week.  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 She tends to do better with her plan on her days off. She tends to eat meals on plan but then struggles with snacking.  She does pack snacks for work but does not always eat them when faced with an indulgent choice. She notes over the past few weeks she has been consistent with exercise.  Subjective:   1. Pre-diabetes Mackenzie [name] is on Ozempic 1 mg. Her last A1C was 5.1. She notes occasional diarrhea. She denies nausea and polyphagia. She endorses cravings.  Lab Results  Component Value Date   HGBA1C 5.1 06/27/2020   Lab Results  Component Value Date   INSULIN 13.3 01/11/2020   INSULIN 12.6 08/09/2019   Assessment/Plan:   1. Pre-diabetes Discussed XIPJAS and she denies prescription for now and will continue Ozempic 1 mg weekly.  2. Obesity: Current BMI 42.6 Mackenzie [name] is currently in the action stage of change. As such, her goal is to continue with weight loss efforts. She has agreed to the Category 3 Plan + 150 snack calories.   Exercise goals: As is.  Behavioral modification strategies: decreasing simple carbohydrates and dealing with family or coworker sabotage.  Mackenzie [name] has agreed to follow-up with our clinic in 2 weeks.   Objective:   Blood pressure 121/80, pulse 92, temperature 98.6 F (37 C), height 5\' 5"  (1.651  m), weight 256 lb (116.1 kg), last menstrual period 02/10/2017, SpO2 97 %, unknown if currently breastfeeding. Body mass index is 42.6 kg/m.  General: Cooperative, alert, well developed, in no acute distress. HEENT: Conjunctivae and lids unremarkable. Cardiovascular: Regular rhythm.  Lungs: Normal work of breathing. Neurologic: No focal deficits.   Lab Results  Component Value Date   CREATININE 0.91 09/17/2020   BUN 15 09/17/2020   NA 136 09/17/2020   K 3.7 09/17/2020   CL 103 09/17/2020   CO2 22 09/17/2020   Lab Results  Component Value Date   ALT 24 09/17/2020   AST 18 09/17/2020   GGT 29 09/17/2020   ALKPHOS 117 09/17/2020   BILITOT 0.3 09/17/2020   Lab Results  Component Value Date   HGBA1C 5.1 06/27/2020   HGBA1C 5.6 01/09/2020   HGBA1C 5.9 (H) 08/09/2019   HGBA1C 5.9 (H) 07/26/2018   Lab Results  Component Value Date   INSULIN 13.3 01/11/2020   INSULIN 12.6 08/09/2019   Lab Results  Component Value Date   TSH 0.657 08/09/2019   Lab Results  Component Value Date   CHOL 213 (H) 06/27/2020   HDL 62 06/27/2020   LDLCALC 144 (H) 06/27/2020   TRIG 33 06/27/2020   CHOLHDL 3.4 06/27/2020   Lab Results  Component Value Date   WBC 4.5 09/17/2020   HGB 12.2 09/17/2020   HCT 37.1 09/17/2020   MCV 88.5 09/17/2020   PLT 253 09/17/2020   Attestation Statements:  Reviewed by clinician on day of visit: allergies, medications, problem list, medical history, surgical history, family history, social history, and previous encounter notes.  Leodis Binet Friedenbach, CMA, am acting as Location manager for [employer], [place].  I have reviewed the above documentation for accuracy and completeness, and I agree with the above. -  Georgianne Fick, FNP

## 2020-10-02 ENCOUNTER — Ambulatory Visit: Payer: 59 | Admitting: Plastic Surgery

## 2020-10-02 ENCOUNTER — Other Ambulatory Visit: Payer: Self-pay

## 2020-10-02 ENCOUNTER — Encounter: Payer: Self-pay | Admitting: Plastic Surgery

## 2020-10-02 VITALS — BP 137/82 | HR 79

## 2020-10-02 DIAGNOSIS — N651 Disproportion of reconstructed breast: Secondary | ICD-10-CM | POA: Diagnosis not present

## 2020-10-02 DIAGNOSIS — Z9012 Acquired absence of left breast and nipple: Secondary | ICD-10-CM

## 2020-10-02 DIAGNOSIS — C50812 Malignant neoplasm of overlapping sites of left female breast: Secondary | ICD-10-CM

## 2020-10-02 DIAGNOSIS — Z170 Estrogen receptor positive status [ER+]: Secondary | ICD-10-CM

## 2020-10-02 NOTE — Progress Notes (Signed)
   Subjective:    Patient ID: Mackenzie [name], female    DOB: 1970-11-10, 50 y.o.   MRN: [id number]  The patient is a lovely 50 year old female here for follow-up on her breast reconstruction.  She had left breast cancer.  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  She had a latissimus muscle flap placed with an implant in 2020.  She still has signs of the radiation damage to her skin but it has improved over the last 2 years.  She had a right mastopexy reduction for symmetry.  She is trying to lose some weight.  She is 5 feet 6 inches tall and weighs 250 pounds.  She would like to get to 200 pounds.  She has been in a weight loss program with only a little bit of improvement.  She would like to have more fullness in the left upper and middle breast area.  There are no areas of concern and all incisions are nicely healed.     Review of Systems  Constitutional: Negative.   HENT: Negative.   Eyes: Negative.   Respiratory: Negative.   Cardiovascular: Negative.   Gastrointestinal: Negative.   Genitourinary: Negative.   Musculoskeletal: Negative.   Hematological: Negative.        Objective:   Physical Exam Vitals and nursing note reviewed.  Constitutional:      Appearance: Normal appearance.  HENT:     Head: Normocephalic and atraumatic.  Cardiovascular:     Rate and Rhythm: Normal rate.     Pulses: Normal pulses.  Pulmonary:     Effort: Pulmonary effort is normal.  Neurological:     Mental Status: She is alert. Mental status is at baseline.  Psychiatric:        Mood and Affect: Mood normal.        Behavior: Behavior normal.         Assessment & Plan:     ICD-10-CM   1. Malignant 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, estrogen receptor positive ([place])  C50.812    Z17.0   2. Breast asymmetry following reconstructive surgery  N65.1   3. Acquired absence of left breast  Z90.12     The patient is doing extremely well and we can do the Lipo filling whenever she  wants.  She is at risk of losing some of that fat if we liposuction it and then she has a 50 pound weight loss.  She still been to work with the weight loss group.  We will do a telemetry visit in 4 months to revisit the possibilities.  Pictures were obtained of the patient and placed in the chart with the patient's or guardian's permission.

## 2020-10-03 ENCOUNTER — Encounter (INDEPENDENT_AMBULATORY_CARE_PROVIDER_SITE_OTHER): Payer: Self-pay | Admitting: Physician Assistant

## 2020-10-03 ENCOUNTER — Other Ambulatory Visit: Payer: Self-pay

## 2020-10-03 ENCOUNTER — Ambulatory Visit (INDEPENDENT_AMBULATORY_CARE_PROVIDER_SITE_OTHER): Payer: 59 | Admitting: Physician Assistant

## 2020-10-03 VITALS — BP 130/73 | HR 79 | Temp 97.8°F | Ht 65.0 in | Wt 255.0 lb

## 2020-10-03 DIAGNOSIS — Z6841 Body Mass Index (BMI) 40.0 and over, adult: Secondary | ICD-10-CM | POA: Diagnosis not present

## 2020-10-03 DIAGNOSIS — I10 Essential (primary) hypertension: Secondary | ICD-10-CM | POA: Diagnosis not present

## 2020-10-04 NOTE — Progress Notes (Signed)
Chief Complaint:   OBESITY Mackenzie [name] is here to discuss her progress with her obesity treatment plan along with follow-up of her obesity related diagnoses. Mackenzie [name] is on the Category 3 Plan + 150 calories and states she is following her eating plan approximately 75-80% of the time. Mackenzie [name] states she is doing zumba, cutting grass, and swimming for 60 minutes 3 times per week.  Today's visit was #: 28 Starting weight: 267 lbs Starting date: 08/09/2019 Today's weight: 255 lbs Today's date: 10/03/2020 Total lbs lost to date: 12 Total lbs lost since last in-office visit: 1  Interim History: Mackenzie [name] feels like the summer months make meal prepping harder. She craves salty, crunchy snacks and would like ideas to help with those cravings.  Subjective:   1. Essential hypertension Mackenzie [name]'s blood pressure is controlled, and she denies chest pain or headache.  Assessment/Plan:   1. Essential hypertension Mackenzie [name] will continue her meal plan, and will continue working on healthy weight loss and exercise to improve blood pressure control. We will watch for signs of hypotension as she continues her lifestyle modifications.  2. Class 3 severe obesity with serious comorbidity and body mass index (BMI) of 40.0 to 44.9 in adult, unspecified obesity type (HCC) Mackenzie [name] is currently in the action stage of change. As such, her goal is to continue with weight loss efforts. She has agreed to the Category 3 Plan + 150 protein calories.   Exercise goals: As is.  Behavioral modification strategies: meal planning and cooking strategies and better snacking choices.  Mackenzie [name] has agreed to follow-up with our clinic in 2 weeks. She was informed of the importance of frequent follow-up visits to maximize her success with intensive lifestyle modifications for her multiple health conditions.   Objective:   Blood pressure 130/73, pulse 79, temperature 97.8 F (36.6 C), height 5\' 5"  (1.651 m), weight 255 lb (115.7  kg), last menstrual period 02/10/2017, SpO2 99 %, unknown if currently breastfeeding. Body mass index is 42.43 kg/m.  General: Cooperative, alert, well developed, in no acute distress. HEENT: Conjunctivae and lids unremarkable. Cardiovascular: Regular rhythm.  Lungs: Normal work of breathing. Neurologic: No focal deficits.   Lab Results  Component Value Date   CREATININE 0.91 09/17/2020   BUN 15 09/17/2020   NA 136 09/17/2020   K 3.7 09/17/2020   CL 103 09/17/2020   CO2 22 09/17/2020   Lab Results  Component Value Date   ALT 24 09/17/2020   AST 18 09/17/2020   GGT 29 09/17/2020   ALKPHOS 117 09/17/2020   BILITOT 0.3 09/17/2020   Lab Results  Component Value Date   HGBA1C 5.1 06/27/2020   HGBA1C 5.6 01/09/2020   HGBA1C 5.9 (H) 08/09/2019   HGBA1C 5.9 (H) 07/26/2018   Lab Results  Component Value Date   INSULIN 13.3 01/11/2020   INSULIN 12.6 08/09/2019   Lab Results  Component Value Date   TSH 0.657 08/09/2019   Lab Results  Component Value Date   CHOL 213 (H) 06/27/2020   HDL 62 06/27/2020   LDLCALC 144 (H) 06/27/2020   TRIG 33 06/27/2020   CHOLHDL 3.4 06/27/2020   Lab Results  Component Value Date   WBC 4.5 09/17/2020   HGB 12.2 09/17/2020   HCT 37.1 09/17/2020   MCV 88.5 09/17/2020   PLT 253 09/17/2020   No results found for: IRON, TIBC, FERRITIN  Attestation Statements:   Reviewed by clinician on day of visit: allergies, medications, problem list, medical history, surgical history, family  history, social history, and previous encounter notes.  Time spent on visit including pre-visit chart review and post-visit care and charting was 30 minutes.    Wilhemena Durie, am acting as transcriptionist for [employer], PA-C.  I have reviewed the above documentation for accuracy and completeness, and I agree with the above. Abby Potash, PA-C

## 2020-10-15 ENCOUNTER — Other Ambulatory Visit: Payer: Self-pay

## 2020-10-15 ENCOUNTER — Ambulatory Visit
Admission: RE | Admit: 2020-10-15 | Discharge: 2020-10-15 | Disposition: A | Discharge status: Home / Self Care | Payer: 59 | Source: Ambulatory Visit | Attending: Physician Assistant | Admitting: Physician Assistant

## 2020-10-15 DIAGNOSIS — C50812 Malignant neoplasm of overlapping sites of left female breast: Secondary | ICD-10-CM

## 2020-10-15 DIAGNOSIS — Z170 Estrogen receptor positive status [ER+]: Secondary | ICD-10-CM

## 2020-10-15 DIAGNOSIS — Z1231 Encounter for screening mammogram for malignant neoplasm of breast: Secondary | ICD-10-CM | POA: Diagnosis not present

## 2020-10-17 ENCOUNTER — Other Ambulatory Visit: Payer: Self-pay

## 2020-10-17 ENCOUNTER — Other Ambulatory Visit (HOSPITAL_COMMUNITY): Payer: Self-pay

## 2020-10-17 ENCOUNTER — Telehealth: Payer: Self-pay | Admitting: Physician Assistant

## 2020-10-17 ENCOUNTER — Ambulatory Visit (INDEPENDENT_AMBULATORY_CARE_PROVIDER_SITE_OTHER): Payer: 59 | Admitting: Physician Assistant

## 2020-10-17 ENCOUNTER — Encounter (INDEPENDENT_AMBULATORY_CARE_PROVIDER_SITE_OTHER): Payer: Self-pay | Admitting: Physician Assistant

## 2020-10-17 VITALS — BP 126/81 | HR 84 | Temp 97.5°F | Ht 65.0 in | Wt 258.0 lb

## 2020-10-17 DIAGNOSIS — Z6841 Body Mass Index (BMI) 40.0 and over, adult: Secondary | ICD-10-CM

## 2020-10-17 DIAGNOSIS — R7303 Prediabetes: Secondary | ICD-10-CM

## 2020-10-17 DIAGNOSIS — E559 Vitamin D deficiency, unspecified: Secondary | ICD-10-CM | POA: Diagnosis not present

## 2020-10-17 DIAGNOSIS — Z9189 Other specified personal risk factors, not elsewhere classified: Secondary | ICD-10-CM | POA: Diagnosis not present

## 2020-10-17 MED ORDER — OZEMPIC (2 MG/DOSE) 8 MG/3ML ~~LOC~~ SOPN
2.0000 mg | PEN_INJECTOR | SUBCUTANEOUS | 0 refills | Status: DC
Start: 2020-10-17 — End: 2020-11-29
  Filled 2020-10-17 – 2020-11-06 (×2): qty 3, 28d supply, fill #0

## 2020-10-17 MED ORDER — VITAMIN D (ERGOCALCIFEROL) 1.25 MG (50000 UNIT) PO CAPS
ORAL_CAPSULE | ORAL | 0 refills | Status: DC
  Filled 2020-10-17: qty 6, 84d supply, fill #0

## 2020-10-17 NOTE — Telephone Encounter (Signed)
I called the patient but was unable to reach her. I left a voicemail that I was calling to review her mammogram results which was benign. It appears she has a Pharmacist, community and if she would like to read the full report, it is available on mychart. Additionally, the imaging department was going to mail the results to her house. If she has any questions about this, she can call us back. I left our call back number.

## 2020-10-17 NOTE — Progress Notes (Signed)
Chief Complaint:   OBESITY Mackenzie [name] is here to discuss her progress with her obesity treatment plan along with follow-up of her obesity related diagnoses. Mackenzie [name] is on the Category 3 Plan + 150 protein calories and states she is following her eating plan approximately 75% of the time. Mackenzie [name] states she is doing zumba and walking for 30-45 minutes 2-4 times per week.  Today's visit was #: 60 Starting weight: 267 lbs Starting date: 08/09/2019 Today's weight: 258 lbs Today's date: 10/17/2020 Total lbs lost to date: 9 Total lbs lost since last in-office visit: 0  Interim History: Mackenzie [name] reports that she has been more "snacky" lately and she has been overeating her calories. She is getting all of her protein in most days.  Subjective:   1. Pre-diabetes Mackenzie [name] does not think Ozempic is working.  2. Vitamin D deficiency Mackenzie [name] is on Vit D, and she denies nausea, vomiting, or muscle weakness.  3. At risk for heart disease Mackenzie [name] is at a higher than average risk for cardiovascular disease due to obesity.   Assessment/Plan:   1. Pre-diabetes Mackenzie [name] agreed to increase Ozempic to 2 mg with no refills. She will continue to work on weight loss, exercise, and decreasing simple carbohydrates to help decrease the risk of diabetes.   - Semaglutide, 2 MG/DOSE, (OZEMPIC, 2 MG/DOSE,) 8 MG/3ML SOPN; Inject 2 mg into the skin once a week.  Dispense: 3 mL; Refill: 0  2. Vitamin D deficiency Low Vitamin D level contributes to fatigue and are associated with obesity, breast, and colon cancer. We will refill prescription Vitamin D for 1 month. Mackenzie [name] will follow-up for routine testing of Vitamin D, at least 2-3 times per year to avoid over-replacement.  - Vitamin D, Ergocalciferol, (DRISDOL) 1.25 MG (50000 UNIT) CAPS capsule; TAKE 1 CAPSULE (50,000 UNITS TOTAL) BY MOUTH EVERY 14 DAYS  Dispense: 6 capsule; Refill: 0  3. At risk for heart disease Mackenzie [name] was given approximately 15 minutes of  coronary artery disease prevention counseling today. She is 50 y.o. female and has risk factors for heart disease including obesity. We discussed intensive lifestyle modifications today with an emphasis on specific weight loss instructions and strategies.   Repetitive spaced learning was employed today to elicit superior memory formation and behavioral change.  4. Class 3 severe obesity with serious comorbidity and body mass index (BMI) of 40.0 to 44.9 in adult, unspecified obesity type (HCC) Mackenzie [name] is currently in the action stage of change. As such, her goal is to continue with weight loss efforts. She has agreed to the Category 3 Plan.   Exercise goals: As is.  Behavioral modification strategies: decreasing simple carbohydrates and better snacking choices.  Mackenzie [name] has agreed to follow-up with our clinic in 2 weeks. She was informed of the importance of frequent follow-up visits to maximize her success with intensive lifestyle modifications for her multiple health conditions.   Objective:   Blood pressure 126/81, pulse 84, temperature (!) 97.5 F (36.4 C), height 5\' 5"  (1.651 m), weight 258 lb (117 kg), last menstrual period 02/10/2017, SpO2 97 %, unknown if currently breastfeeding. Body mass index is 42.93 kg/m.  General: Cooperative, alert, well developed, in no acute distress. HEENT: Conjunctivae and lids unremarkable. Cardiovascular: Regular rhythm.  Lungs: Normal work of breathing. Neurologic: No focal deficits.   Lab Results  Component Value Date   CREATININE 0.91 09/17/2020   BUN 15 09/17/2020   NA 136 09/17/2020   K 3.7 09/17/2020   CL 103 09/17/2020  CO2 22 09/17/2020   Lab Results  Component Value Date   ALT 24 09/17/2020   AST 18 09/17/2020   GGT 29 09/17/2020   ALKPHOS 117 09/17/2020   BILITOT 0.3 09/17/2020   Lab Results  Component Value Date   HGBA1C 5.1 06/27/2020   HGBA1C 5.6 01/09/2020   HGBA1C 5.9 (H) 08/09/2019   HGBA1C 5.9 (H) 07/26/2018    Lab Results  Component Value Date   INSULIN 13.3 01/11/2020   INSULIN 12.6 08/09/2019   Lab Results  Component Value Date   TSH 0.657 08/09/2019   Lab Results  Component Value Date   CHOL 213 (H) 06/27/2020   HDL 62 06/27/2020   LDLCALC 144 (H) 06/27/2020   TRIG 33 06/27/2020   CHOLHDL 3.4 06/27/2020   Lab Results  Component Value Date   WBC 4.5 09/17/2020   HGB 12.2 09/17/2020   HCT 37.1 09/17/2020   MCV 88.5 09/17/2020   PLT 253 09/17/2020   No results found for: IRON, TIBC, FERRITIN  Attestation Statements:   Reviewed by clinician on day of visit: allergies, medications, problem list, medical history, surgical history, family history, social history, and previous encounter notes.   Wilhemena Durie, am acting as transcriptionist for [employer], PA-C.  I have reviewed the above documentation for accuracy and completeness, and I agree with the above. Abby Potash, PA-C

## 2020-10-25 ENCOUNTER — Other Ambulatory Visit (HOSPITAL_COMMUNITY): Payer: Self-pay

## 2020-10-31 ENCOUNTER — Other Ambulatory Visit: Payer: Self-pay

## 2020-10-31 ENCOUNTER — Encounter (INDEPENDENT_AMBULATORY_CARE_PROVIDER_SITE_OTHER): Payer: Self-pay | Admitting: Physician Assistant

## 2020-10-31 ENCOUNTER — Ambulatory Visit (INDEPENDENT_AMBULATORY_CARE_PROVIDER_SITE_OTHER): Payer: 59 | Admitting: Physician Assistant

## 2020-10-31 VITALS — BP 104/74 | HR 91 | Temp 98.2°F | Ht 65.0 in | Wt 258.0 lb

## 2020-10-31 DIAGNOSIS — Z6841 Body Mass Index (BMI) 40.0 and over, adult: Secondary | ICD-10-CM | POA: Diagnosis not present

## 2020-10-31 DIAGNOSIS — I10 Essential (primary) hypertension: Secondary | ICD-10-CM | POA: Diagnosis not present

## 2020-10-31 NOTE — Progress Notes (Signed)
Chief Complaint:   OBESITY Mackenzie [name] is here to discuss her progress with her obesity treatment plan along with follow-up of her obesity related diagnoses. Mackenzie [name] is on the Category 3 Plan and states she is following her eating plan approximately 75% of the time. Mackenzie [name] states she is doing aerobics and walking for 25-30 minutes 2 times per week.  Today's visit was #: 68 Starting weight: 267 lbs Starting date: 08/09/2019 Today's weight: 258 lbs Today's date: 10/31/2020 Total lbs lost to date: 9 Total lbs lost since last in-office visit: 0  Interim History: Mackenzie [name] reports that she did some "bad snacking" prior to starting her 2 mg Ozempic 3 days ago. She attended a banquet and indulged on 2 chocolate cupcakes. Her daughter's graduation is coming up.  Subjective:   1. Essential hypertension Mackenzie [name]'s blood pressure is controlled, and she is not on medications. She is managed by her primary care physician.  Assessment/Plan:   1. Essential hypertension Mackenzie [name] is working on healthy weight loss and exercise to improve blood pressure control. We will watch for signs of hypotension as she continues her lifestyle modifications.  2. Class 3 severe obesity with serious comorbidity and body mass index (BMI) of 40.0 to 44.9 in adult, unspecified obesity type (HCC) Mackenzie [name] is currently in the action stage of change. As such, her goal is to continue with weight loss efforts. She has agreed to the Category 3 Plan.   Exercise goals: As is.  Behavioral modification strategies: meal planning and cooking strategies and celebration eating strategies.  Mackenzie [name] has agreed to follow-up with our clinic in 2 weeks. She was informed of the importance of frequent follow-up visits to maximize her success with intensive lifestyle modifications for her multiple health conditions.   Objective:   Blood pressure 104/74, pulse 91, temperature 98.2 F (36.8 C), height 5\' 5"  (1.651 m), weight 258 lb (117 kg),  last menstrual period 02/10/2017, SpO2 96 %, unknown if currently breastfeeding. Body mass index is 42.93 kg/m.  General: Cooperative, alert, well developed, in no acute distress. HEENT: Conjunctivae and lids unremarkable. Cardiovascular: Regular rhythm.  Lungs: Normal work of breathing. Neurologic: No focal deficits.   Lab Results  Component Value Date   CREATININE 0.91 09/17/2020   BUN 15 09/17/2020   NA 136 09/17/2020   K 3.7 09/17/2020   CL 103 09/17/2020   CO2 22 09/17/2020   Lab Results  Component Value Date   ALT 24 09/17/2020   AST 18 09/17/2020   GGT 29 09/17/2020   ALKPHOS 117 09/17/2020   BILITOT 0.3 09/17/2020   Lab Results  Component Value Date   HGBA1C 5.1 06/27/2020   HGBA1C 5.6 01/09/2020   HGBA1C 5.9 (H) 08/09/2019   HGBA1C 5.9 (H) 07/26/2018   Lab Results  Component Value Date   INSULIN 13.3 01/11/2020   INSULIN 12.6 08/09/2019   Lab Results  Component Value Date   TSH 0.657 08/09/2019   Lab Results  Component Value Date   CHOL 213 (H) 06/27/2020   HDL 62 06/27/2020   LDLCALC 144 (H) 06/27/2020   TRIG 33 06/27/2020   CHOLHDL 3.4 06/27/2020   Lab Results  Component Value Date   WBC 4.5 09/17/2020   HGB 12.2 09/17/2020   HCT 37.1 09/17/2020   MCV 88.5 09/17/2020   PLT 253 09/17/2020   No results found for: IRON, TIBC, FERRITIN  Attestation Statements:   Reviewed by clinician on day of visit: allergies, medications, problem list, medical history, surgical history,  family history, social history, and previous encounter notes.  Time spent on visit including pre-visit chart review and post-visit care and charting was 31 minutes.    Wilhemena Durie, am acting as transcriptionist for [employer], PA-C.  I have reviewed the above documentation for accuracy and completeness, and I agree with the above. Abby Potash, PA-C

## 2020-11-06 ENCOUNTER — Other Ambulatory Visit (HOSPITAL_COMMUNITY): Payer: Self-pay

## 2020-11-08 ENCOUNTER — Other Ambulatory Visit (HOSPITAL_COMMUNITY): Payer: Self-pay

## 2020-11-08 ENCOUNTER — Other Ambulatory Visit (INDEPENDENT_AMBULATORY_CARE_PROVIDER_SITE_OTHER): Payer: Self-pay | Admitting: Physician Assistant

## 2020-11-08 DIAGNOSIS — R7303 Prediabetes: Secondary | ICD-10-CM

## 2020-11-08 NOTE — Telephone Encounter (Signed)
Last seen by Tracey 

## 2020-11-12 ENCOUNTER — Other Ambulatory Visit (HOSPITAL_COMMUNITY): Payer: Self-pay

## 2020-11-12 MED ORDER — METFORMIN HCL 500 MG PO TABS
500.0000 mg | ORAL_TABLET | Freq: Two times a day (BID) | ORAL | 0 refills | Status: DC
  Filled 2020-11-12: qty 180, 90d supply, fill #0

## 2020-11-12 NOTE — Telephone Encounter (Signed)
Okay to refill? 

## 2020-11-14 ENCOUNTER — Other Ambulatory Visit (HOSPITAL_COMMUNITY): Payer: Self-pay

## 2020-11-15 ENCOUNTER — Other Ambulatory Visit: Payer: Self-pay

## 2020-11-15 ENCOUNTER — Ambulatory Visit (INDEPENDENT_AMBULATORY_CARE_PROVIDER_SITE_OTHER): Payer: 59 | Admitting: Physician Assistant

## 2020-11-15 VITALS — BP 120/82 | HR 87 | Temp 98.2°F | Ht 65.0 in | Wt 255.0 lb

## 2020-11-15 DIAGNOSIS — Z9189 Other specified personal risk factors, not elsewhere classified: Secondary | ICD-10-CM

## 2020-11-15 DIAGNOSIS — Z6841 Body Mass Index (BMI) 40.0 and over, adult: Secondary | ICD-10-CM | POA: Diagnosis not present

## 2020-11-15 DIAGNOSIS — E559 Vitamin D deficiency, unspecified: Secondary | ICD-10-CM | POA: Diagnosis not present

## 2020-11-15 DIAGNOSIS — E7849 Other hyperlipidemia: Secondary | ICD-10-CM | POA: Diagnosis not present

## 2020-11-15 DIAGNOSIS — R7303 Prediabetes: Secondary | ICD-10-CM

## 2020-11-16 LAB — LIPID PANEL
Chol/HDL Ratio: 3.5 ratio (ref 0.0–4.4)
Cholesterol, Total: 202 mg/dL — ABNORMAL HIGH (ref 100–199)
HDL: 58 mg/dL (ref >39)
LDL Chol Calc (NIH): 135 mg/dL — ABNORMAL HIGH (ref 0–99)
Triglycerides: 52 mg/dL (ref 0–149)
VLDL Cholesterol Cal: 9 mg/dL (ref 5–40)

## 2020-11-16 LAB — HEMOGLOBIN A1C
Est. average glucose Bld gHb Est-mCnc: 108 mg/dL
Hgb A1c MFr Bld: 5.4 % (ref 4.8–5.6)

## 2020-11-16 LAB — INSULIN, RANDOM: INSULIN: 11.7 u[IU]/mL (ref 2.6–24.9)

## 2020-11-16 LAB — VITAMIN D 25 HYDROXY (VIT D DEFICIENCY, FRACTURES): Vit D, 25-Hydroxy: 74.7 ng/mL (ref 30.0–100.0)

## 2020-11-19 ENCOUNTER — Telehealth: Payer: Self-pay | Admitting: *Deleted

## 2020-11-19 NOTE — Telephone Encounter (Signed)
Mackenzie [name]; Patient confirmed her lab appointment this Friday morning to check hormone levels. She is aware she does not need to fast for this lab.  Foye Spurling, BSN, RN Clinical Research Nurse 11/19/2020 2:51 PM

## 2020-11-22 NOTE — Progress Notes (Signed)
Chief Complaint:   OBESITY Mackenzie [name] is here to discuss her progress with her obesity treatment plan along with follow-up of her obesity related diagnoses. Mackenzie [name] is on the Category 3 Plan and states she is following her eating plan approximately 70% of the time. Mackenzie [name] states she is doing zumba for 45 minutes 2 times per week, waking for 30 minutes 2 times per week, and doing yard work 1 time per week.   Today's visit was #: 26 Starting weight: 267 lbs Starting date: 08/09/2019 Today's weight: 255 lbs Today's date: 11/15/2020 Total lbs lost to date: 12 Total lbs lost since last in-office visit: 3  Interim History: Mackenzie [name] is surprised by the weight loss due to attending multiple celebrations and splurging at times. 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  Subjective:   1. Vitamin D deficiency Christe is on Vit D, and she denies nausea, vomiting, or muscle weakness.  2. Pre-diabetes Mackenzie [name] is on Ozempic and metformin, and she denies side effects. Her appetite is controlled.  3. At risk for diabetes mellitus Mackenzie [name] is at higher than average risk for developing diabetes due to obesity.   Assessment/Plan:   1. Vitamin D deficiency Low Vitamin D level contributes to fatigue and are associated with obesity, breast, and colon cancer. We will check labs today. Danni will continue taking prescription 50,000 IU every 14 days and will follow-up for routine testing of Vitamin D, at least 2-3 times per year to avoid over-replacement.  - VITAMIN D 25 Hydroxy (Vit-D Deficiency, Fractures)  2. Pre-diabetes Mackenzie [name] will continue her medications, meal plan, and decreasing simple carbohydrates to help decrease the risk of diabetes. We will check labs today.  - Hemoglobin A1c - Insulin, random - Lipid panel  3. At risk for diabetes mellitus Mackenzie [name] was given approximately 15 minutes of diabetes education and counseling today. We discussed intensive  lifestyle modifications today with an emphasis on weight loss as well as increasing exercise and decreasing simple carbohydrates in her diet. We also reviewed medication options with an emphasis on risk versus benefit of those discussed.   Repetitive spaced learning was employed today to elicit superior memory formation and behavioral change.  4. Class 3 severe obesity with serious comorbidity and body mass index (BMI) of 40.0 to 44.9 in adult, unspecified obesity type (HCC) Mackenzie [name] is currently in the action stage of change. As such, her goal is to continue with weight loss efforts. She has agreed to the Category 3 Plan.   Exercise goals: As is.  Behavioral modification strategies: meal planning and cooking strategies and celebration eating strategies.  Mackenzie [name] has agreed to follow-up with our clinic in 2 weeks. She was informed of the importance of frequent follow-up visits to maximize her success with intensive lifestyle modifications for her multiple health conditions.   Mackenzie [name] was informed we would discuss her lab results at her next visit unless there is a critical issue that needs to be addressed sooner. Mackenzie [name] agreed to keep her next visit at the agreed upon time to discuss these results.  Objective:   Blood pressure 120/82, pulse 87, temperature 98.2 F (36.8 C), height 5\' 5"  (1.651 m), weight 255 lb (115.7 kg), last menstrual period 02/10/2017, SpO2 96 %, unknown if currently breastfeeding. Body mass index is 42.43 kg/m.  General: Cooperative, alert, well developed, in no acute distress. HEENT: Conjunctivae and lids unremarkable. Cardiovascular: Regular rhythm.  Lungs: Normal work of breathing. Neurologic: No focal deficits.  Lab Results  Component Value Date   CREATININE 0.91 09/17/2020   BUN 15 09/17/2020   NA 136 09/17/2020   K 3.7 09/17/2020   CL 103 09/17/2020   CO2 22 09/17/2020   Lab Results  Component Value Date   ALT 24 09/17/2020   AST 18 09/17/2020    GGT 29 09/17/2020   ALKPHOS 117 09/17/2020   BILITOT 0.3 09/17/2020   Lab Results  Component Value Date   HGBA1C 5.4 11/15/2020   HGBA1C 5.1 06/27/2020   HGBA1C 5.6 01/09/2020   HGBA1C 5.9 (H) 08/09/2019   HGBA1C 5.9 (H) 07/26/2018   Lab Results  Component Value Date   INSULIN 11.7 11/15/2020   INSULIN 13.3 01/11/2020   INSULIN 12.6 08/09/2019   Lab Results  Component Value Date   TSH 0.657 08/09/2019   Lab Results  Component Value Date   CHOL 202 (H) 11/15/2020   HDL 58 11/15/2020   LDLCALC 135 (H) 11/15/2020   TRIG 52 11/15/2020   CHOLHDL 3.5 11/15/2020   Lab Results  Component Value Date   WBC 4.5 09/17/2020   HGB 12.2 09/17/2020   HCT 37.1 09/17/2020   MCV 88.5 09/17/2020   PLT 253 09/17/2020   No results found for: IRON, TIBC, FERRITIN  Attestation Statements:   Reviewed by clinician on day of visit: allergies, medications, problem list, medical history, surgical history, family history, social history, and previous encounter notes.   Wilhemena Durie, am acting as transcriptionist for [employer], PA-C.  I have reviewed the above documentation for accuracy and completeness, and I agree with the above. Abby Potash, PA-C

## 2020-11-23 ENCOUNTER — Other Ambulatory Visit: Payer: Self-pay

## 2020-11-23 ENCOUNTER — Inpatient Hospital Stay: Payer: 59 | Attending: Adult Health

## 2020-11-23 DIAGNOSIS — R7303 Prediabetes: Secondary | ICD-10-CM

## 2020-11-23 DIAGNOSIS — Z006 Encounter for examination for normal comparison and control in clinical research program: Secondary | ICD-10-CM | POA: Insufficient documentation

## 2020-11-23 DIAGNOSIS — C50812 Malignant neoplasm of overlapping sites of left female breast: Secondary | ICD-10-CM | POA: Insufficient documentation

## 2020-11-23 DIAGNOSIS — C50912 Malignant neoplasm of unspecified site of left female breast: Secondary | ICD-10-CM

## 2020-11-23 DIAGNOSIS — E66813 Obesity, class 3: Secondary | ICD-10-CM

## 2020-11-23 DIAGNOSIS — C50212 Malignant neoplasm of upper-inner quadrant of left female breast: Secondary | ICD-10-CM

## 2020-11-23 DIAGNOSIS — Z170 Estrogen receptor positive status [ER+]: Secondary | ICD-10-CM

## 2020-11-24 LAB — FOLLICLE STIMULATING HORMONE: FSH: 12.6 m[IU]/mL

## 2020-11-27 LAB — ESTRADIOL, ULTRA SENS: Estradiol, Sensitive: <2.5 pg/mL

## 2020-11-28 ENCOUNTER — Other Ambulatory Visit: Payer: Self-pay | Admitting: *Deleted

## 2020-11-28 DIAGNOSIS — Z0001 Encounter for general adult medical examination with abnormal findings: Secondary | ICD-10-CM | POA: Diagnosis not present

## 2020-11-28 DIAGNOSIS — E559 Vitamin D deficiency, unspecified: Secondary | ICD-10-CM | POA: Diagnosis not present

## 2020-11-28 DIAGNOSIS — Z23 Encounter for immunization: Secondary | ICD-10-CM | POA: Diagnosis not present

## 2020-11-28 DIAGNOSIS — C50812 Malignant neoplasm of overlapping sites of left female breast: Secondary | ICD-10-CM

## 2020-11-28 DIAGNOSIS — Z170 Estrogen receptor positive status [ER+]: Secondary | ICD-10-CM

## 2020-11-29 ENCOUNTER — Ambulatory Visit (INDEPENDENT_AMBULATORY_CARE_PROVIDER_SITE_OTHER): Payer: 59 | Admitting: Physician Assistant

## 2020-11-29 ENCOUNTER — Encounter (INDEPENDENT_AMBULATORY_CARE_PROVIDER_SITE_OTHER): Payer: Self-pay | Admitting: Physician Assistant

## 2020-11-29 ENCOUNTER — Other Ambulatory Visit: Payer: Self-pay

## 2020-11-29 ENCOUNTER — Other Ambulatory Visit (HOSPITAL_COMMUNITY): Payer: Self-pay

## 2020-11-29 ENCOUNTER — Encounter: Payer: Self-pay | Admitting: Oncology

## 2020-11-29 VITALS — BP 122/78 | HR 90 | Temp 98.1°F | Ht 65.0 in | Wt 256.0 lb

## 2020-11-29 DIAGNOSIS — Z9189 Other specified personal risk factors, not elsewhere classified: Secondary | ICD-10-CM

## 2020-11-29 DIAGNOSIS — R7303 Prediabetes: Secondary | ICD-10-CM | POA: Diagnosis not present

## 2020-11-29 DIAGNOSIS — I10 Essential (primary) hypertension: Secondary | ICD-10-CM

## 2020-11-29 DIAGNOSIS — Z6841 Body Mass Index (BMI) 40.0 and over, adult: Secondary | ICD-10-CM | POA: Diagnosis not present

## 2020-11-29 MED ORDER — OZEMPIC (2 MG/DOSE) 8 MG/3ML ~~LOC~~ SOPN
2.0000 mg | PEN_INJECTOR | SUBCUTANEOUS | 0 refills | Status: DC
Start: 2020-11-29 — End: 2020-12-26
  Filled 2020-11-29: qty 9, 84d supply, fill #0

## 2020-12-04 NOTE — Progress Notes (Signed)
Chief Complaint:   OBESITY Mackenzie [name] is here to discuss her progress with her obesity treatment plan along with follow-up of her obesity related diagnoses. Mackenzie [name] is on the Category 3 Plan and states she is following her eating plan approximately 75-80% of the time. Mackenzie [name] states she is doing Zumba for 45 minutes 2 times per week, doing yard work for 2 hours 1 time per week, and walking for 30-45 minutes 4 times per week.  Today's visit was #: 56 Starting weight: 267 lbs Starting date: 08/09/2019 Today's weight: 256 lbs Today's date: 11/29/2020 Total lbs lost to date: 11 Total lbs lost since last in-office visit: 0  Interim History: Mackenzie [name] reports that Ozempic 2 mg has decreased her appetite, but she is able to get in her food most days.  Subjective:   1. Pre-diabetes Mackenzie [name] is on Ozempic 2 mg, and she is tolerating it well. Last A1c was 5.4. I discussed labs with the patient today.  2. At risk for heart disease Mackenzie [name] is at a higher than average risk for cardiovascular disease due to obesity.   Assessment/Plan:   1. Pre-diabetes Mackenzie [name] will continue to work on weight loss, exercise, and decreasing simple carbohydrates to help decrease the risk of diabetes. We will refill Ozempic for 90 days with no refills.  - Semaglutide, 2 MG/DOSE, (OZEMPIC, 2 MG/DOSE,) 8 MG/3ML SOPN; Inject 2 mg into the skin once a week.  Dispense: 9 mL; Refill: 0  2. At risk for heart disease Mackenzie [name] was given approximately 15 minutes of coronary artery disease prevention counseling today. She is 50 y.o. female and has risk factors for heart disease including obesity. We discussed intensive lifestyle modifications today with an emphasis on specific weight loss instructions and strategies.   Repetitive spaced learning was employed today to elicit superior memory formation and behavioral change.  3. Class 3 severe obesity with serious comorbidity and body mass index (BMI) of 40.0 to 44.9 in adult,  unspecified obesity type (HCC) Mackenzie [name] is currently in the action stage of change. As such, her goal is to continue with weight loss efforts. She has agreed to the Category 3 Plan and keeping a food journal and adhering to recommended goals of 1400-1500 calories and 95 grams of protein daily.   Exercise goals: As is.  Behavioral modification strategies: meal planning and cooking strategies and keeping a strict food journal.  Mackenzie [name] has agreed to follow-up with our clinic in 2 weeks. She was informed of the importance of frequent follow-up visits to maximize her success with intensive lifestyle modifications for her multiple health conditions.   Objective:   Blood pressure 122/78, pulse 90, temperature 98.1 F (36.7 C), height 5\' 5"  (1.651 m), weight 256 lb (116.1 kg), last menstrual period 02/10/2017, SpO2 99 %, unknown if currently breastfeeding. Body mass index is 42.6 kg/m.  General: Cooperative, alert, well developed, in no acute distress. HEENT: Conjunctivae and lids unremarkable. Cardiovascular: Regular rhythm.  Lungs: Normal work of breathing. Neurologic: No focal deficits.   Lab Results  Component Value Date   CREATININE 0.91 09/17/2020   BUN 15 09/17/2020   NA 136 09/17/2020   K 3.7 09/17/2020   CL 103 09/17/2020   CO2 22 09/17/2020   Lab Results  Component Value Date   ALT 24 09/17/2020   AST 18 09/17/2020   GGT 29 09/17/2020   ALKPHOS 117 09/17/2020   BILITOT 0.3 09/17/2020   Lab Results  Component Value Date   HGBA1C 5.4 11/15/2020  HGBA1C 5.1 06/27/2020   HGBA1C 5.6 01/09/2020   HGBA1C 5.9 (H) 08/09/2019   HGBA1C 5.9 (H) 07/26/2018   Lab Results  Component Value Date   INSULIN 11.7 11/15/2020   INSULIN 13.3 01/11/2020   INSULIN 12.6 08/09/2019   Lab Results  Component Value Date   TSH 0.657 08/09/2019   Lab Results  Component Value Date   CHOL 202 (H) 11/15/2020   HDL 58 11/15/2020   LDLCALC 135 (H) 11/15/2020   TRIG 52 11/15/2020    CHOLHDL 3.5 11/15/2020   Lab Results  Component Value Date   WBC 4.5 09/17/2020   HGB 12.2 09/17/2020   HCT 37.1 09/17/2020   MCV 88.5 09/17/2020   PLT 253 09/17/2020   No results found for: IRON, TIBC, FERRITIN  Attestation Statements:   Reviewed by clinician on day of visit: allergies, medications, problem list, medical history, surgical history, family history, social history, and previous encounter notes.   Wilhemena Durie, am acting as transcriptionist for [employer], PA-C.  I have reviewed the above documentation for accuracy and completeness, and I agree with the above. Abby Potash, PA-C

## 2020-12-05 ENCOUNTER — Other Ambulatory Visit (HOSPITAL_COMMUNITY): Payer: Self-pay

## 2020-12-05 MED ORDER — LOSARTAN POTASSIUM 50 MG PO TABS
50.0000 mg | ORAL_TABLET | Freq: Every day | ORAL | 0 refills | Status: DC | PRN
  Filled 2020-12-05: qty 90, 90d supply, fill #0

## 2020-12-05 MED FILL — Letrozole Tab 2.5 MG: ORAL | 90 days supply | Qty: 90 | Fill #1 | Status: AC

## 2020-12-06 ENCOUNTER — Other Ambulatory Visit (HOSPITAL_COMMUNITY): Payer: Self-pay

## 2020-12-07 ENCOUNTER — Telehealth: Payer: Self-pay | Admitting: *Deleted

## 2020-12-07 NOTE — Telephone Encounter (Signed)
Mackenzie [name];  Left message for patient reminding her of Lab appointment next Tuesday morning at 8 am and to fast for at least 8 hrs prior to lab and to not take her Letrozole until after labs are drawn.  Reminded her of appt on Wednesday morning at 8:30 am to see Dr. Jana Hakim and to remember to bring in completed diaries and medication bottles for that visit. Asked patient to let research nurse know if any questions or problems with the appointments.  Foye Spurling, BSN, RN Clinical Research Nurse 12/07/2020 2:56 PM

## 2020-12-11 ENCOUNTER — Inpatient Hospital Stay: Payer: 59 | Attending: Adult Health

## 2020-12-11 ENCOUNTER — Other Ambulatory Visit: Payer: Self-pay

## 2020-12-11 DIAGNOSIS — Z170 Estrogen receptor positive status [ER+]: Secondary | ICD-10-CM

## 2020-12-11 DIAGNOSIS — C50812 Malignant neoplasm of overlapping sites of left female breast: Secondary | ICD-10-CM | POA: Insufficient documentation

## 2020-12-11 LAB — CBC WITH DIFFERENTIAL (CANCER CENTER ONLY)
Abs Immature Granulocytes: 0 10*3/uL (ref 0.00–0.07)
Basophils Absolute: 0 10*3/uL (ref 0.0–0.1)
Basophils Relative: 1 %
Eosinophils Absolute: 0 10*3/uL (ref 0.0–0.5)
Eosinophils Relative: 1 %
HCT: 34.6 % — ABNORMAL LOW (ref 36.0–46.0)
Hemoglobin: 11.7 g/dL — ABNORMAL LOW (ref 12.0–15.0)
Immature Granulocytes: 0 %
Lymphocytes Relative: 34 %
Lymphs Abs: 1.3 10*3/uL (ref 0.7–4.0)
MCH: 29.5 pg (ref 26.0–34.0)
MCHC: 33.8 g/dL (ref 30.0–36.0)
MCV: 87.4 fL (ref 80.0–100.0)
Monocytes Absolute: 0.7 10*3/uL (ref 0.1–1.0)
Monocytes Relative: 17 %
Neutro Abs: 1.9 10*3/uL (ref 1.7–7.7)
Neutrophils Relative %: 47 %
Platelet Count: 214 10*3/uL (ref 150–400)
RBC: 3.96 MIL/uL (ref 3.87–5.11)
RDW: 14 % (ref 11.5–15.5)
WBC Count: 3.9 10*3/uL — ABNORMAL LOW (ref 4.0–10.5)
nRBC: 0 % (ref 0.0–0.2)

## 2020-12-11 LAB — CMP (CANCER CENTER ONLY)
ALT: 19 U/L (ref 0–44)
AST: 20 U/L (ref 15–41)
Albumin: 3.6 g/dL (ref 3.5–5.0)
Alkaline Phosphatase: 96 U/L (ref 38–126)
Anion gap: 6 (ref 5–15)
BUN: 15 mg/dL (ref 6–20)
CO2: 25 mmol/L (ref 22–32)
Calcium: 9.1 mg/dL (ref 8.9–10.3)
Chloride: 103 mmol/L (ref 98–111)
Creatinine: 0.9 mg/dL (ref 0.44–1.00)
GFR, Estimated: >60 mL/min (ref >60)
Glucose, Bld: 83 mg/dL (ref 70–99)
Potassium: 3.9 mmol/L (ref 3.5–5.1)
Sodium: 134 mmol/L — ABNORMAL LOW (ref 135–145)
Total Bilirubin: 0.3 mg/dL (ref 0.3–1.2)
Total Protein: 7.5 g/dL (ref 6.5–8.1)

## 2020-12-11 LAB — PHOSPHORUS: Phosphorus: 3.4 mg/dL (ref 2.5–4.6)

## 2020-12-11 LAB — BILIRUBIN, DIRECT: Bilirubin, Direct: <0.1 mg/dL (ref 0.0–0.2)

## 2020-12-11 LAB — LIPASE, BLOOD: Lipase: 30 U/L (ref 11–51)

## 2020-12-11 LAB — AMYLASE: Amylase: 66 U/L (ref 28–100)

## 2020-12-11 LAB — LACTATE DEHYDROGENASE: LDH: 187 U/L (ref 98–192)

## 2020-12-11 LAB — MAGNESIUM: Magnesium: 1.8 mg/dL (ref 1.7–2.4)

## 2020-12-11 LAB — GAMMA GT: GGT: 21 U/L (ref 7–50)

## 2020-12-11 LAB — URIC ACID: Uric Acid, Serum: 3.5 mg/dL (ref 2.5–7.1)

## 2020-12-11 NOTE — Progress Notes (Addendum)
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  Her study nurse was also present during the visit  Mackenzie [name] is on study with [name]-- taking Ribociclb and Letrozole daily.  She is tolerating these medications without any major side effects that she is aware of.    She had been on goserelin for ovarian suppression but given her age of 50 we thought she might actually already be postmenopausal so her last goserelin dose was 04/02/2020.  Her most recent labs show a Pinch of just over 12 but an estradiol of less than 2.5.  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  It is not conclusive for definitive menopause and we will need to continue to follow these labs every 3 months.  Her most recent bone density screening on 03/16/2018, showed a T-score of -0.7, which is considered normal. She is scheduled for repeat on 03/20/2021.  Since her last visit, she underwent right screening mammography with tomography at [facility] on 10/15/2020 showing: breast density category C; no evidence of malignancy.    REVIEW OF SYSTEMS: Mackenzie [name] had a little bit of discomfort in the right calf where she has a varicose vein which has been present for some time.   As she had a little bit of discomfort in the inner right thigh over the weekend, after walking 3 miles and doing some yard work and this holiday.  She also had some chest pain 2 days ago.  This localizes to the epigastric area which is of course typical of reflux.  She is not on any reflux medication at this point.  This pain has resolved in the meantime.  A detailed review of systems today was otherwise stable   COVID 19 VACCINATION STATUS: Status post Pfizer x2 with booster September 2021   HISTORY OF CURRENT ILLNESS: From the original intake note:  The patient herself noted some changes in her left breast late July 2018, she says, and eventually brought this to medical attention so that on 01/09/2017 she underwent bilateral diagnostic mammography with tomography and left breast ultrasonography at the breast Center. This found the breast density to be category C. In the upper inner quadrant of the left breast there was an area of asymmetry and there were malignant type calcifications involving all 4 quadrants. On exam there is firmness and palpable thickening in the anterior left breast with skin dimpling. Ultrasonography found at the 9:30 o'clock radiant 3 cm from the nipple a 2.7 cm mass and in the left axilla for abnormal lymph nodes largest of which measured 2.7 cm.  Biopsy of the left breast 9:30 o'clock mass 80 01/26/2017 showed ([id number]) invasive ductal carcinoma, with extracellular mucin, grade 1 or 2. In the lower outer left breast is separated biopsy the same day showed ductal carcinoma in situ. One of the 4 lymph nodes involved  was positive for metastatic carcinoma. Prognostic panel on the invasive disease showed it to be estrogen receptor 100% positive, progesterone receptor 20% positive, both with strong staining intensity, with an MIB-1 of 20%, and no HER-2 amplification with a signals ratio 1.38 and the number per cell 2.90.  On 01/22/2017 the patient underwent bilateral breast MRI.  This showed no involvement of the right breast, but in the left breast there was a masslike and non-masslike enhancement involving all quadrants, with skin swelling but no abnormal enhancement of the skin or nipple areolar complex or pectoralis muscle. The mass could not be clearly measured but spanned approximately 12 cm. There was bulky left axillary lymphadenopathy, with the largest lymph node measuring up to 3.4 cm.  The patient's subsequent history is as detailed below.   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, left ([facility]) 01/23/2017   Edema, lower extremity    Hot flashes 03/2017   Hyperlipidemia 03/20/2016   Morbid obesity with body mass index (BMI) of 40.0 to 49.9 (HCC) 09/17/2017   NCGS (non-celiac gluten sensitivity)    Pre-diabetes 07/26/2018   Hgb A1C elevated on 07/26/2018, Gestational Diabetes 2012   Seasonal allergies 2012   seasonal allergies causes allergic rhinitis and itchy, dry eyes per pt   Vitamin D deficiency 05/2015    PAST SURGICAL HISTORY: Past Surgical History:  Procedure Laterality Date   BREAST RECONSTRUCTION WITH PLACEMENT OF TISSUE EXPANDER AND FLEX HD (ACELLULAR HYDRATED DERMIS) Left 08/27/2017   Procedure: LEFT BREAST RECONSTRUCTION WITH PLACEMENT OF TISSUE EXPANDER AND FLEX HD;  Surgeon: Wallace Going, DO;  Location: [facility];  Service: Plastics;  Laterality: Left;   CESAREAN SECTION     x2   LATISSIMUS FLAP TO BREAST Left 11/03/2018   LATISSIMUS FLAP TO BREAST Left 11/03/2018   Procedure: LATISSIMUS FLAP TO LEFT BREAST;  Surgeon: Wallace Going, DO;  Location: [facility];  Service: Plastics;  Laterality: Left;   MASTECTOMY MODIFIED RADICAL Left 08/27/2017   MASTECTOMY MODIFIED  RADICAL Left 08/27/2017   Procedure: LEFT MODIFIED RADICAL MASTECTOMY;  Surgeon: Erroll Luna, MD;  Location: [facility];  Service: General;  Laterality: Left;   MASTOPEXY Right 03/03/2019   Procedure: RIGHT BREAST MASTOPEXY/REDUCTION;  Surgeon: Wallace Going, DO;  Location: [facility];  Service: Plastics;  Laterality: Right;  3 hours, please   PORT-A-CATH REMOVAL Right 08/27/2017   Procedure: REMOVAL PORT-A-CATH RIGHT CHEST;  Surgeon: Erroll Luna, MD;  Location: [facility];  Service: General;  Laterality: Right;   PORTACATH PLACEMENT Right 01/28/2017   Procedure: INSERTION PORT-A-CATH WITH ULTRASOUND;  Surgeon: Erroll Luna, MD;  Location: [facility];  Service: General;  Laterality: Right;   REDUCTION MAMMAPLASTY Right    REMOVAL OF TISSUE EXPANDER AND PLACEMENT OF IMPLANT Left 03/03/2019   Procedure: REMOVAL OF TISSUE EXPANDER AND PLACEMENT OF EXPANDER;  Surgeon: Wallace Going, DO;  Location: [facility];  Service: Plastics;  Laterality: Left;   REMOVAL OF TISSUE EXPANDER AND PLACEMENT OF IMPLANT Left 05/25/2019   Procedure: LEFT BREAST REMOVAL OF TISSUE EXPANDER AND PLACEMENT OF IMPLANT;  Surgeon: Wallace Going, DO;  Location: [facility];  Service: Plastics;  Laterality: Left;  TISSUE EXPANDER PLACEMENT Left 11/03/2018   Procedure: PLACEMENT OF TISSUE EXPANDER LEFT BREAST;  Surgeon: Wallace Going, DO;  Location: [facility];  Service: Plastics;  Laterality: Left;  Total case time is 3.5 hours   TUBAL LIGATION Bilateral 01/21/2011    FAMILY HISTORY Family History  Problem Relation Age of Onset   Breast cancer Paternal Grandmother 56       d.60s from breast cancer. Did not have treatment.   Other Mother        H.41 from complications of surgery to remove brain tumor   Diabetes Mother    Hypertension Mother    Stroke Mother    Kidney disease Father    Hypertension Other    Diabetes Other    Stroke Other   The  patient's father still alive at age 63. The patient's mother died with a brain tumor which the patient says was "benign". She was 56. The patient has one brother, no sisters. The only breast cancer in the family is a paternal grandmother who died from breast cancer at an unknown age. There is no history of ovarian or prostate cancer in the family.   GYNECOLOGIC HISTORY:  Patient's last menstrual period was 02/10/2017. Menarche age 70 and first live birth age 77 she is [place] P4. The patient is still having regular periods as of August 2018   SOCIAL HISTORY: (Updated 05/31/2018) Mackenzie [name], Mackenzie [name]. She is separated from her husband (as of 05/2018), [place]enzie [name], who is a Building control surveyor.  1 daughter is married and lives in [name] and works as a Education administrator. Mackenzie [name]'s second daughter, Mackenzie [name], lives in [place] and works in the police department. Daughters, Mackenzie [name] and Mackenzie [name] are 5 and 6, living at home. The patient has a grandson she looks after. She attends a [employer].     ADVANCED DIRECTIVES: At the 05/03/2020 visit the patient tells me she has named her daughter Mackenzie [name] as her healthcare power of attorney.  Mackenzie [name] may be reached at [place]: Social History   Tobacco Use   Smoking status: Never   Smokeless tobacco: Never  Vaping Use   Vaping Use: Never used  Substance Use Topics   Alcohol use: Yes    Comment: social   Drug use: No    Colonoscopy: Never  PAP: December 2017  Bone density: Never   Allergies  Allergen Reactions   Dilaudid [Hydromorphone] Itching    Current Outpatient Medications  Medication Sig Dispense Refill   acetaminophen (TYLENOL) 500 MG tablet Take 1,000 mg by mouth every 6 (six) hours as needed (for pain/headaches.).      B Complex Vitamins (VITAMIN B COMPLEX PO) Take 1 capsule by mouth 2 (two) times daily.      budesonide-formoterol (SYMBICORT) 80-4.5 MCG/ACT inhaler Inhale 2 puffs  into the lungs every morning.      Calcium Carb-Cholecalciferol (CALCIUM 600+D3 PO) Take 1 tablet by mouth 2 (two) times daily.      cetirizine (ZYRTEC) 10 MG tablet Take 10 mg by mouth daily.     gabapentin (NEURONTIN) 300 MG capsule TAKE 1 CAPSULE (300 MG TOTAL) BY MOUTH AT BEDTIME. 90 capsule 4   hydroxypropyl methylcellulose / hypromellose (ISOPTO TEARS / GONIOVISC) 2.5 % ophthalmic solution Place 1 drop into both eyes daily as needed for dry eyes.      Investigational ribociclib (KISQALI) 200 MG tablet [name] Take 2 tablets (400 mg total) by mouth daily. Take 2 tablets (  $'400mg'R$  total) by mouth daily on days 1-21.  Repeat every 28 days. 225 tablet 0   letrozole (FEMARA) 2.5 MG tablet TAKE 1 TABLET BY MOUTH DAILY. 90 tablet 4   loratadine (CLARITIN) 10 MG tablet Take 1 tablet (10 mg total) by mouth daily. (Patient taking differently: Take 10 mg by mouth daily as needed for allergies.) 90 tablet 2   metFORMIN (GLUCOPHAGE) 500 MG tablet Take 1 tablet (500 mg total) by mouth 2 (two) times daily with a meal. 180 tablet 0   Multiple Vitamin (MULTIVITAMIN WITH MINERALS) TABS tablet Take 1 tablet by mouth daily.      Omega-3 Fatty Acids (FISH OIL) 1000 MG CAPS Take 1,000 mg by mouth 2 (two) times daily.      phenylephrine (SUDAFED PE) 10 MG TABS tablet Take 10 mg by mouth every 4 (four) hours as needed.      Probiotic Product (PROBIOTIC DAILY PO) Take 1 capsule by mouth 2 (two) times daily.      Semaglutide, 2 MG/DOSE, (OZEMPIC, 2 MG/DOSE,) 8 MG/3ML SOPN Inject 2 mg into the skin once a week. 9 mL 0   Vitamin D, Ergocalciferol, (DRISDOL) 1.25 MG (50000 UNIT) CAPS capsule TAKE 1 CAPSULE (50,000 UNITS TOTAL) BY MOUTH EVERY 14 DAYS 6 capsule 0   ZINC OXIDE PO Take 1 tablet by mouth 2 (two) times a day.     No current facility-administered medications for this visit.   Facility-Administered Medications Ordered in Other Visits  Medication Dose Route Frequency Provider Last Rate Last Admin   sodium  chloride flush (NS) 0.9 % injection 10 mL  10 mL Intravenous PRN Chaelyn Bunyan, [name], MD   10 mL at 03/10/17 1239    OBJECTIVE: African-American woman in no acute distress  Vitals:   12/12/20 0817  BP: 127/70  Pulse: 77  Resp: 18  Temp: (!) 97.5 F (36.4 C)  SpO2: 100%   Wt Readings from Last 3 Encounters:  12/12/20 259 lb 3.2 oz (117.6 kg)  11/29/20 256 lb (116.1 kg)  11/15/20 255 lb (115.7 kg)   Body mass index is 43.13 kg/m.   ECOG FS: 1  Sclerae unicteric, EOMs intact Wearing a mask No cervical or supraclavicular adenopathy Lungs no rales or rhonchi Heart regular rate and rhythm Abd soft, nontender, positive bowel sounds MSK no focal spinal tenderness, straight leg raising bilaterally to 30 degrees unremarkable kappa palpation of the right calf and right upper medial thigh unremarkable Neuro: nonfocal, well oriented, appropriate affect Breasts: The right breast is status post reduction mammoplasty.  The left breast is status post mastectomy and reconstruction.  There is no evidence of local recurrence.  Both axillae are benign   LAB RESULTS:  Appointment on 12/11/2020  Component Date Value Ref Range Status   Uric Acid, Serum 12/11/2020 3.5  2.5 - 7.1 mg/dL Final   Performed at [facility], [address], [place], [place] [address]   Phosphorus 12/11/2020 3.4  2.5 - 4.6 mg/dL Final   Performed at [facility] [address], [place], [place] [address]   Magnesium 12/11/2020 1.8  1.7 - 2.4 mg/dL Final   Performed at [facility], [place] [address], [place], [place] [address]   Lipase 12/11/2020 30  11 - 51 U/L Final   Performed at [facility], [place] [address], [place],  [address]   LDH 12/11/2020 187  98 - 192 U/L Final   Performed at [facility], [place] [address] [place], [place] [address]   GGT 12/11/2020 21  7 - 50 U/L Final   Performed at [facility], [place] [address], [place], [place] [address]   Sodium 12/11/2020 134 (A) 135 - 145 mmol/L Final   Potassium 12/11/2020 3.9  3.5 - 5.1 mmol/L Final   Chloride 12/11/2020 103  98 - 111 mmol/L Final   CO2 12/11/2020 25  22 - 32 mmol/L Final   Glucose, Bld 12/11/2020 83  70 - 99 mg/dL Final   Glucose reference range applies only to samples taken after fasting for at least 8 hours.   BUN 12/11/2020 15  6 - 20 mg/dL Final   Creatinine 12/11/2020 0.90  0.44 - 1.00 mg/dL Final   Calcium 12/11/2020 9.1  8.9 - 10.3 mg/dL Final   Total Protein 12/11/2020 7.5  6.5 - 8.1 g/dL Final   Albumin 12/11/2020 3.6  3.5 - 5.0 g/dL Final   AST 12/11/2020 20  15 - 41 U/L Final   ALT 12/11/2020 19  0 - 44 U/L Final   Alkaline Phosphatase 12/11/2020 96  38 - 126 U/L Final   Total Bilirubin 12/11/2020 0.3  0.3 - 1.2 mg/dL Final   GFR, Estimated 12/11/2020 >60  >60 mL/min Final   Comment: (NOTE) Calculated using the CKD-EPI Creatinine Equation (2021)    Anion gap 12/11/2020 6  5 - 15 Final   Performed at [facility], [place] [address], [place], [place] [address]   WBC Count 12/11/2020 3.9 (A) 4.0 - 10.5 K/uL Final   RBC 12/11/2020 3.96  3.87 - 5.11 MIL/uL Final   Hemoglobin 12/11/2020 11.7 (A) 12.0 - 15.0 g/dL Final   HCT 12/11/2020 34.6 (A) 36.0 - 46.0 % Final   MCV 12/11/2020 87.4  80.0 - 100.0 fL Final   MCH 12/11/2020 29.5  26.0 - 34.0 pg Final   MCHC 12/11/2020 33.8  30.0 - 36.0 g/dL Final   RDW 12/11/2020 14.0  11.5 - 15.5 % Final   Platelet Count 12/11/2020 214  150 - 400 K/uL Final   nRBC 12/11/2020 0.0  0.0 - 0.2 % Final   Neutrophils Relative % 12/11/2020 47  % Final   Neutro Abs 12/11/2020 1.9  1.7 - 7.7 K/uL Final   Lymphocytes Relative 12/11/2020 34  % Final   Lymphs Abs 12/11/2020 1.3  0.7 - 4.0 K/uL Final   Monocytes Relative 12/11/2020 17  % Final   Monocytes Absolute 12/11/2020 0.7  0.1 - 1.0 K/uL Final   Eosinophils Relative 12/11/2020 1  %  Final   Eosinophils Absolute 12/11/2020 0.0  0.0 - 0.5 K/uL Final   Basophils Relative 12/11/2020 1  % Final   Basophils Absolute 12/11/2020 0.0  0.0 - 0.1 K/uL Final   Immature Granulocytes 12/11/2020 0  % Final   Abs Immature Granulocytes 12/11/2020 0.00  0.00 - 0.07 K/uL Final   Performed at [facility], [place] [address], [place], [place] [address]   Bilirubin, Direct 12/11/2020 <0.1  0.0 - 0.2 mg/dL Final   Performed at [facility] [address] [place], [place] [address]   Amylase 12/11/2020 66  28 - 100 U/L Final   Performed at [facility], [place] [address] [place], [place] [address]     STUDIES: No results found.   ELIGIBLE FOR AVAILABLE RESEARCH PROTOCOL: [name], ASA study   ASSESSMENT: 50 y.o. [place] woman status post left breast  upper inner quadrant biopsy 01/14/2017 for a clinical T3 N2, stage IIA invasive ductal carcinoma, grade 1 or 2, estrogen and progesterone receptor positive, HER-2 nonamplified, with an MIB-1 of 20%.  (1) staging studies: Brain MRI, bone scan, and CT scan of the chest 02/05/2017 showed no brain lesions, no lung or liver lesions, a 4.9 cm mass in the left breast with left axillary and subpectoral adenopathy, and nonspecific bone scan tracer at L2, left scapula, and anterior ribs, with lumbar spine MRI suggested for further evaluation.  (a) lumbar spine MRI 02/17/2017 showed no normal bone lesions.  There was mild lumbar spondylosis  (2) neoadjuvant chemotherapy consisting of cyclophosphamide and doxorubicin in dose dense fashion 4 started 02/10/2017, completed 03/24/2017, followed by weekly carboplatin and gemcitabine given days 1 and 8 of each 21-day cycle starting 04/14/2017, completing the planned 4 cycles 06/26/2017  (3) is post left modified radical mastectomy on 08/27/2017 showing an mpT3 pN2 residual invasive ductal carcinoma, grade 2, with a residual cancer burden of 3.  Margins  were clear  (a) latissimus flap reconstruction with expander 11/03/2018  (4) postmastectomy radiation completed 01/01/2018  (a) capecitabine radiosensitization 11/16/2017-01/01/2018  (5) goserelin started 09/21/2017  (a) letrozole started 01/11/2018  (b) enrolled in [place] clinical trial, randomized to ribociclib on 05/31/2018  (c) Bone density on 03/16/2018 was normal with T score of -0.7 in the L1-L4 spine  (6) genetics testing 03/24/2017 through the Common Hereditary Cancer Panel offered by [facility] found no deleterious mutations in APC, ATM, AXIN2, BARD1, BMPR1A, BRCA1, BRCA2, BRIP1, CDH1, CDKN2A (p14ARF), CDKN2A (p16INK4a), CHEK2, CTNNA1, DICER1, EPCAM (Deletion/duplication testing only), GREM1 (promoter region deletion/duplication testing only), KIT, MEN1, MLH1, MSH2, MSH3, MSH6, MUTYH, NBN, NF1, NHTL1, PALB2, PDGFRA, PMS2, POLD1, POLE, PTEN, RAD50, RAD51C, RAD51D, SDHB, SDHC, SDHD, SMAD4, SMARCA4. STK11, TP53, TSC1, TSC2, and VHL.  The following genes were evaluated for sequence changes only: SDHA and HOXB13 c.251G>A variant only.    (7) Right breast reduction and left breast expander placement on 03/03/2019 with latissimus flap placement  (a) left implant implant exchanged on 05/25/2019 or a [place] Smooth Round Ultra High Profile Gel 750cc. Ref #[id number].  Serial Number [id number]   PLAN: Lashae is a little over 3 years out from definitive surgery for her breast cancer with no evidence of disease recurrence.  This is very favorable.  She continues on letrozole with very good tolerance and the plan is to continue a minimum of 5 years.  She is of course on the [place] study and which she is being seen on an every 82-month basis as a result.  She is having some reflux and I have written for her to receive the omeprazole for a month and then as needed.  The symptoms she reports documented in the review of systems above are not related to her cancer or its treatment or any of the  medications she is on at present  She will see [name] again in 3 months.  She knows to call for any other issue that may develop before then  Total encounter time 25 minutes.Sarajane Jews C. Cash Meadow, MD  12/12/20 8:38 AM Medical Oncology and Hematology [facility] [place], [place] [address] Tel. [phone]    Fax. [phone]   I, Wilburn Mylar, am acting as scribe for Dr. [name]. Linsey Arteaga.  I, Lurline Del MD, have reviewed the above documentation for accuracy and completeness, and I agree with the above.   *Total Encounter Time as defined  by the Centers for Medicare and Medicaid Services includes, in addition to the face-to-face time of a patient visit (documented in the note above) non-face-to-face time: obtaining and reviewing outside history, ordering and reviewing medications, tests or procedures, care coordination (communications with other health care professionals or caregivers) and documentation in the medical record.

## 2020-12-12 ENCOUNTER — Inpatient Hospital Stay: Payer: 59 | Admitting: Oncology

## 2020-12-12 ENCOUNTER — Other Ambulatory Visit (HOSPITAL_COMMUNITY): Payer: Self-pay

## 2020-12-12 ENCOUNTER — Encounter: Payer: Self-pay | Admitting: *Deleted

## 2020-12-12 VITALS — BP 127/70 | HR 77 | Temp 97.5°F | Resp 18 | Ht 65.0 in | Wt 259.2 lb

## 2020-12-12 DIAGNOSIS — Z170 Estrogen receptor positive status [ER+]: Secondary | ICD-10-CM

## 2020-12-12 DIAGNOSIS — Z006 Encounter for examination for normal comparison and control in clinical research program: Secondary | ICD-10-CM

## 2020-12-12 DIAGNOSIS — C50812 Malignant neoplasm of overlapping sites of left female breast: Secondary | ICD-10-CM

## 2020-12-12 DIAGNOSIS — C50212 Malignant neoplasm of upper-inner quadrant of left female breast: Secondary | ICD-10-CM

## 2020-12-12 DIAGNOSIS — C50912 Malignant neoplasm of unspecified site of left female breast: Secondary | ICD-10-CM

## 2020-12-12 MED ORDER — INV-RIBOCICLIB 200 MG TAB NATALEE TRIO003 STUDY: 400.0000 mg | ORAL_TABLET | Freq: Every day | ORAL | 0 refills | Status: DC

## 2020-12-12 MED ORDER — OMEPRAZOLE 40 MG PO CPDR
40.0000 mg | DELAYED_RELEASE_CAPSULE | Freq: Every day | ORAL | 1 refills | Status: DC
  Filled 2020-12-12: qty 60, 60d supply, fill #0
  Filled 2021-02-15: qty 60, 60d supply, fill #1

## 2020-12-12 NOTE — Research (Signed)
[name] Cycle 34 Day 3  12/12/20 9:45am  [name] arrived today unaccompanied for her scheduled Cycle 34 visit on the [name] protocol. .Patient scheduled for cycle 34 assessments on day 3 due to logistical issues related to clinic closed on day 1,12/10/20, for July 4th Holiday.   Labs: Lab work was drawn, per protocol, yesterday 12/11/20. Patient confirms she was fasting as directed. Lyman and Estradiol were drawn 2 weeks ago and results were available for review. All lab results reviewed by Dr. Jana Hakim.  Dr. Jana Hakim states WBC 3.9 and Hgb 11.7 are not considered clinically significant.  Corrected Calcium Formula:  4 g/dL utilized for "normal albumin;" local normal range is 3.5-5 g/dL. Corrected Calcium = (0.8 x [normal albumin - patient's albumin]) + serum calcium Correct Calcium = (0.8 x[4-3.6]) + 9.1 Corrected Calcium = 9.42 g.dL  PROs: Not required at this visit.  Vital Signs: Patient was seated for five minutes prior to measurement of vital signs. See VS Flowsheet.   Healthcare Resource Utilization: Patient has not been admitted to any healthcare facility or had any visits in our clinic since her last visit.  Concomitant Medications: Current medication list reviewed with patient. Patient confirmed it was correct except that Cozaar was listed and patient states she has not gotten that prescription filled and does not plan on it at this time. Patient states PCP prescribed it for HTN, but patient feels her blood pressure has been well controlled off medication. Cozaar removed from list and instructed patient to update this research nurse if she does start taking it.  (Records for this visit will be requested from PCP office)  Adverse Events:  Patient reports mild pain right calf and right thigh started a few days ago and intermittent in nature. She also started having mid-sternal chest pains intermittently, noticed more with inspiration, a few days ago. Patient did not seek  medical attention or take any medications for this pain. Dr. Jana Hakim prescribed Omeprazole for the chest pain, which he states is most likely symptoms of Reflux.  He states he is not concerned about the leg pain and he does not feel any of these symptoms are related to the study drugs, ribociclib, letrozole or goserelin.  Patient confirmed other previously noted AEs are ongoing, without change.  These are listed in AE table below as ongoing.      Physical Exam and Performance Status:  Patient examined today by Dr. Jana Hakim.  Please see his office visit encounter for related notes.  Patient does not require any dose modification per protocol, will continue ribociclib at 400 mg. Dr. Jana Hakim is in agreement. He instructed okay to start this cycle of ribociclib and to continue to hold goserelin.  Patient denies any menstrual bleeding and understands to notify Dr. Jana Hakim and/or research nurse if she starts to have any vaginal bleeding.    Investigational Medication:  Patient returned her completed medication diaries and bottles for cycles 31-33. Patient returned the following kits, drug count confirmed by Raul Del, PharmD: Kit [id number],  5 pills remaining indicating 1 missed dose. This was known at previous visit due to delayed start of cycle since previous study visit was delayed by 1 day for logistical scheduling issues. Patient diary on Cycle 31 concurs with this missed dose at the beginning of this cycle. Patient was 100% compliant with dosing this cycle. Kit [id number], 3 pills remaining which was expected and patient diary cycle 32 concurs with 100% compliance on dosing. Kit #[id number], 5 pills remaining which  indicating 1 missed dose this cycle. Patient reports she forgot one day due to traveling out of town. Patient diary cycle 33 concurs with 1 missed dose. Medication diaries show 1 missed dose of letrozole same day she missed the ribociclib while traveling during cycle 33.   Visit was logged in  the IRT system and the following kits were dispensed to the patient: [id number], [id number], and [id number]  for cycles 34-36. Patient verbalized understanding of how to take medications.  Patient was also provided with drug diaries for cycles 34-36. Patient took prescribed dose of ribociclib and letrozole in clinic at 9:15 am 12/12/20 and recorded doses in medication diary.   Visit Scheduling - Patient will return in 3 months for her next trial visit. Lab on 03/01/21 and provider visit on 03/04/21. Patient aware that she should contact the clinic at any time if she has any questions or concerns prior to her next visit.  Patient thanked for her ongoing participation in the [place] trial.  AE Table: Event Grade Onset Date End Date Status Comments Attribution to Ribociclib Attribution to Letrozole Attribution to Goserelin  Allergic Rhinitis 2 2012   ongoing At Baseline n/a n/a n/a  Constipation 1 08/27/17   ongoing At Baseline n/a n/a n/a  Cough, intermittent 2 05/09/18   ongoing At Baseline n/a n/a n/a  GERD, intermittent 1 2018   ongoing At Baseline n/a n/a n/a  Gluten Sensitivity 1 12/2017   ongoing At Baseline n/a n/a n/a  Headaches 1 02/2017   ongoing At Baseline n/a n/a n/a  Hot Flashes 1 04/02/20   ongoing          Hyperglycemia 1 2018   ongoing At Baseline n/a n/a n/a  Hyperlipidemia 1 03/2016   ongoing At Baseline n/a n/a n/a  Vitamin D Deficiency 2 05/2015   ongoing At Baseline n/a n/a n/a  Arthralgias bilat legs 1 10/01/18   ongoing At Baseline        Bilateral foot pain/ Pes Planus 2     ongoing At Baseline, recently needed new orthotics        Fatigue 1 06/28/18   ongoing   Yes No No  Hypertension  1-3 2012   ongoing At Baseline. Intermittent grade 1 - 3 recorded since 2012. Changed to History at Baseline.         Hepatic Cysts 1 04/04/19   ongoing [name] 04/04/19 & MRI 07/06/19   No No No  Cholelithiasis 1 04/04/19   ongoing [name] 04/04/19 & MRI 07/06/19 No No No  Neuropathy bilateral feet 1 10/08/19    recurred Resolved and then recurred. Gabapentin Yes No No  Dyspnea on exertion 1 08/09/19   ongoing  going upstairs No No No  Lymphedema bilateral arms 1 07/25/19   ongoing PHT and Sleeves No No No  Bilateral Knee Pain 2 07/11/19   ongoing Intermittent. PHT and steroid injections No No No  Nail Infection (fungus on toenails) 1 10/08/19   ongoing   No No No  Vaginal Dryness 1 10/17/19   ongoing   No Yes Yes  Pre-Diabetes 2 07/26/18  ongoing Elevated Hgb A1C, started metformin No  No No  GERD 2 12/10/20  ongoing Rx for omeprazole given today-12/12/20 No No No  Pain, Right Calf 1 12/10/20  ongoing  No No No  Pain, Right Thigh 1 12/10/20  ongoing  No No No    Cameo Windham, BSN, [employer]

## 2020-12-13 ENCOUNTER — Ambulatory Visit (INDEPENDENT_AMBULATORY_CARE_PROVIDER_SITE_OTHER): Payer: 59 | Admitting: Physician Assistant

## 2020-12-13 ENCOUNTER — Other Ambulatory Visit (HOSPITAL_COMMUNITY): Payer: Self-pay

## 2020-12-13 ENCOUNTER — Other Ambulatory Visit: Payer: Self-pay

## 2020-12-13 ENCOUNTER — Telehealth: Payer: Self-pay | Admitting: Adult Health

## 2020-12-13 ENCOUNTER — Encounter (INDEPENDENT_AMBULATORY_CARE_PROVIDER_SITE_OTHER): Payer: Self-pay | Admitting: Physician Assistant

## 2020-12-13 VITALS — BP 124/79 | HR 80 | Temp 97.6°F | Ht 65.0 in | Wt 255.0 lb

## 2020-12-13 DIAGNOSIS — E559 Vitamin D deficiency, unspecified: Secondary | ICD-10-CM | POA: Diagnosis not present

## 2020-12-13 DIAGNOSIS — R7303 Prediabetes: Secondary | ICD-10-CM | POA: Diagnosis not present

## 2020-12-13 DIAGNOSIS — Z9189 Other specified personal risk factors, not elsewhere classified: Secondary | ICD-10-CM | POA: Diagnosis not present

## 2020-12-13 DIAGNOSIS — Z6841 Body Mass Index (BMI) 40.0 and over, adult: Secondary | ICD-10-CM

## 2020-12-13 MED ORDER — VITAMIN D (ERGOCALCIFEROL) 1.25 MG (50000 UNIT) PO CAPS
ORAL_CAPSULE | ORAL | 0 refills | Status: DC
  Filled 2020-12-13 – 2020-12-26 (×2): qty 6, 84d supply, fill #0

## 2020-12-13 NOTE — Telephone Encounter (Signed)
Scheduled appts per 7/6 sch msg. Pt aware.  

## 2020-12-20 NOTE — Progress Notes (Signed)
Chief Complaint:   OBESITY Mackenzie [name] is here to discuss her progress with her obesity treatment plan along with follow-up of her obesity related diagnoses. Mackenzie [name] is on the Category 3 Plan and keeping a food journal and adhering to recommended goals of 1400-1500 calories and 95 grams of protein daily and states she is following her eating plan approximately 65-70% of the time. Barbee states she is walking 2 miles 2-3 times per week, and doing Zumba for 45 minutes 2 times per week.  Today's visit was #: 53 Starting weight: 267 lbs Starting date: 08/09/2019 Today's weight: 255 lbs Today's date: 12/13/2020 Total lbs lost to date: 12 Total lbs lost since last in-office visit: 1  Interim History: Mackenzie [name] reports that she made good choices while traveling and she was able to exercise. She reports that she needs to cook and plan her meals.  Subjective:   1. Vitamin D deficiency Kareem is on Vit D, and she denies nausea, vomiting, or muscle weakness.  2. Pre-diabetes Mackenzie [name] is on Ozempic 2 mg and metformin, and she is tolerating both well. Her hunger is controlled.  3. At risk for diabetes mellitus Mackenzie [name] is at higher than average risk for developing diabetes due to obesity.   Assessment/Plan:   1. Vitamin D deficiency Low Vitamin D level contributes to fatigue and are associated with obesity, breast, and colon cancer. We will refill prescription Vitamin D for 90 days with no refills. Alasha will follow-up for routine testing of Vitamin D, at least 2-3 times per year to avoid over-replacement.  - Vitamin D, Ergocalciferol, (DRISDOL) 1.25 MG (50000 UNIT) CAPS capsule; TAKE 1 CAPSULE (50,000 UNITS TOTAL) BY MOUTH EVERY 14 DAYS  Dispense: 6 capsule; Refill: 0  2. Pre-diabetes Linzee will continue her medications, exercise, and meal plan to help decrease the risk of diabetes.   3. At risk for diabetes mellitus [name] was given approximately 15 minutes of diabetes education and  counseling today. We discussed intensive lifestyle modifications today with an emphasis on weight loss as well as increasing exercise and decreasing simple carbohydrates in her diet. We also reviewed medication options with an emphasis on risk versus benefit of those discussed.   Repetitive spaced learning was employed today to elicit superior memory formation and behavioral change.  4. Class 3 severe obesity with serious comorbidity and body mass index (BMI) of 40.0 to 44.9 in adult, unspecified obesity type (HCC) Mackenzie [name] is currently in the action stage of change. As such, her goal is to continue with weight loss efforts. She has agreed to the Category 3 Plan.   Exercise goals: As is.  Behavioral modification strategies: meal planning and cooking strategies and keeping healthy foods in the home.  [name] has agreed to follow-up with our clinic in 2 weeks. She was informed of the importance of frequent follow-up visits to maximize her success with intensive lifestyle modifications for her multiple health conditions.   Objective:   Blood pressure 124/79, pulse 80, temperature 97.6 F (36.4 C), height 5\' 5"  (1.651 m), weight 255 lb (115.7 kg), last menstrual period 02/10/2017, SpO2 99 %, unknown if currently breastfeeding. Body mass index is 42.43 kg/m.  General: Cooperative, alert, well developed, in no acute distress. HEENT: Conjunctivae and lids unremarkable. Cardiovascular: Regular rhythm.  Lungs: Normal work of breathing. Neurologic: No focal deficits.   Lab Results  Component Value Date   CREATININE 0.90 12/11/2020   BUN 15 12/11/2020   NA 134 (L) 12/11/2020   K 3.9 12/11/2020  CL 103 12/11/2020   CO2 25 12/11/2020   Lab Results  Component Value Date   ALT 19 12/11/2020   AST 20 12/11/2020   GGT 21 12/11/2020   ALKPHOS 96 12/11/2020   BILITOT 0.3 12/11/2020   Lab Results  Component Value Date   HGBA1C 5.4 11/15/2020   HGBA1C 5.1 06/27/2020   HGBA1C 5.6 01/09/2020    HGBA1C 5.9 (H) 08/09/2019   HGBA1C 5.9 (H) 07/26/2018   Lab Results  Component Value Date   INSULIN 11.7 11/15/2020   INSULIN 13.3 01/11/2020   INSULIN 12.6 08/09/2019   Lab Results  Component Value Date   TSH 0.657 08/09/2019   Lab Results  Component Value Date   CHOL 202 (H) 11/15/2020   HDL 58 11/15/2020   LDLCALC 135 (H) 11/15/2020   TRIG 52 11/15/2020   CHOLHDL 3.5 11/15/2020   Lab Results  Component Value Date   VD25OH 74.7 11/15/2020   VD25OH 73.12 06/27/2020   VD25OH 44.05 01/09/2020   Lab Results  Component Value Date   WBC 3.9 (L) 12/11/2020   HGB 11.7 (L) 12/11/2020   HCT 34.6 (L) 12/11/2020   MCV 87.4 12/11/2020   PLT 214 12/11/2020   No results found for: IRON, TIBC, FERRITIN  Attestation Statements:   Reviewed by clinician on day of visit: allergies, medications, problem list, medical history, surgical history, family history, social history, and previous encounter notes.   Wilhemena Durie, am acting as transcriptionist for [employer], PA-C.  I have reviewed the above documentation for accuracy and completeness, and I agree with the above. Abby Potash, PA-C

## 2020-12-21 ENCOUNTER — Other Ambulatory Visit (HOSPITAL_COMMUNITY): Payer: Self-pay

## 2020-12-26 ENCOUNTER — Other Ambulatory Visit: Payer: Self-pay

## 2020-12-26 ENCOUNTER — Encounter (INDEPENDENT_AMBULATORY_CARE_PROVIDER_SITE_OTHER): Payer: Self-pay | Admitting: Physician Assistant

## 2020-12-26 ENCOUNTER — Ambulatory Visit (INDEPENDENT_AMBULATORY_CARE_PROVIDER_SITE_OTHER): Payer: 59 | Admitting: Physician Assistant

## 2020-12-26 ENCOUNTER — Other Ambulatory Visit (HOSPITAL_COMMUNITY): Payer: Self-pay

## 2020-12-26 VITALS — BP 125/83 | HR 79 | Temp 98.1°F | Ht 65.0 in | Wt 253.0 lb

## 2020-12-26 DIAGNOSIS — Z9189 Other specified personal risk factors, not elsewhere classified: Secondary | ICD-10-CM

## 2020-12-26 DIAGNOSIS — R7303 Prediabetes: Secondary | ICD-10-CM | POA: Diagnosis not present

## 2020-12-26 DIAGNOSIS — Z6841 Body Mass Index (BMI) 40.0 and over, adult: Secondary | ICD-10-CM | POA: Diagnosis not present

## 2020-12-26 DIAGNOSIS — E7849 Other hyperlipidemia: Secondary | ICD-10-CM | POA: Diagnosis not present

## 2020-12-26 MED ORDER — OZEMPIC (2 MG/DOSE) 8 MG/3ML ~~LOC~~ SOPN
2.0000 mg | PEN_INJECTOR | SUBCUTANEOUS | 0 refills | Status: DC
  Filled 2020-12-26: qty 9, 84d supply, fill #0

## 2021-01-02 NOTE — Progress Notes (Signed)
Chief Complaint:   OBESITY Mackenzie [name] is here to discuss her progress with her obesity treatment plan along with follow-up of her obesity related diagnoses. Mackenzie [name] is on the Category 3 Plan and states she is following her eating plan approximately 70-75% of the time. Mackenzie [name] states she is doing zumba for 45 minutes 2 times per week, and walking for 20 minutes 5 times per week.  Today's visit was #: 21 Starting weight: 267 lbs Starting date: 08/09/2019 Today's weight: 253 lbs Today's date: 12/26/2020 Total lbs lost to date: 14 Total lbs lost since last in-office visit: 2  Interim History: Mackenzie [name] did well with weight loss. She has been trying to get more activity in weekly. She has been doing some boredom snacking.  Subjective:   1. Pre-diabetes Mackenzie [name] is on Ozempic 2 mg and metformin BID. She denies polyphagia.  2. Other hyperlipidemia Mackenzie [name] is on Omega 3's. Her last lipid panel was not at goal.  3. At risk for heart disease Mackenzie [name] is at a higher than average risk for cardiovascular disease due to obesity.   Assessment/Plan:   1. Pre-diabetes Mackenzie [name] will continue to work on weight loss, exercise, and decreasing simple carbohydrates to help decrease the risk of diabetes. We will refill Ozempic for 1 month.  - Semaglutide, 2 MG/DOSE, (OZEMPIC, 2 MG/DOSE,) 8 MG/3ML SOPN; Inject 2 mg into the skin once a week.  Dispense: 9 mL; Refill: 0  2. Other hyperlipidemia Cardiovascular risk and specific lipid/LDL goals reviewed. We discussed several lifestyle modifications today. Mackenzie [name] will continue her meal plan, exercise and weight loss efforts. Orders and follow up as documented in patient record.   Counseling Intensive lifestyle modifications are the first line treatment for this issue. Dietary changes: Increase soluble fiber. Decrease simple carbohydrates. Exercise changes: Moderate to vigorous-intensity aerobic activity 150 minutes per week if tolerated. Lipid-lowering  medications: see documented in medical record.  3. At risk for heart disease Mackenzie [name] was given approximately 15 minutes of coronary artery disease prevention counseling today. She is 50 y.o. female and has risk factors for heart disease including obesity. We discussed intensive lifestyle modifications today with an emphasis on specific weight loss instructions and strategies.   Repetitive spaced learning was employed today to elicit superior memory formation and behavioral change.  4. Class 3 severe obesity with serious comorbidity and body mass index (BMI) of 40.0 to 44.9 in adult, unspecified obesity type (Mackenzie [name]), current bmi 42.1 Mackenzie [name] is currently in the action stage of change. As such, her goal is to continue with weight loss efforts. She has agreed to the Category 3 Plan.   Exercise goals: As is.  Behavioral modification strategies: better snacking choices and planning for success.  Mackenzie [name] has agreed to follow-up with our clinic in 2 weeks. She was informed of the importance of frequent follow-up visits to maximize her success with intensive lifestyle modifications for her multiple health conditions.   Objective:   Blood pressure 125/83, pulse 79, temperature 98.1 F (36.7 C), height '5\' 5"'$  (1.651 m), weight 253 lb (114.8 kg), last menstrual period 02/10/2017, SpO2 98 %, unknown if currently breastfeeding. Body mass index is 42.1 kg/m.  General: Cooperative, alert, well developed, in no acute distress. HEENT: Conjunctivae and lids unremarkable. Cardiovascular: Regular rhythm.  Lungs: Normal work of breathing. Neurologic: No focal deficits.   Lab Results  Component Value Date   CREATININE 0.90 12/11/2020   BUN 15 12/11/2020   NA 134 (L) 12/11/2020   K 3.9 12/11/2020  CL 103 12/11/2020   CO2 25 12/11/2020   Lab Results  Component Value Date   ALT 19 12/11/2020   AST 20 12/11/2020   GGT 21 12/11/2020   ALKPHOS 96 12/11/2020   BILITOT 0.3 12/11/2020   Lab Results   Component Value Date   HGBA1C 5.4 11/15/2020   HGBA1C 5.1 06/27/2020   HGBA1C 5.6 01/09/2020   HGBA1C 5.9 (H) 08/09/2019   HGBA1C 5.9 (H) 07/26/2018   Lab Results  Component Value Date   INSULIN 11.7 11/15/2020   INSULIN 13.3 01/11/2020   INSULIN 12.6 08/09/2019   Lab Results  Component Value Date   TSH 0.657 08/09/2019   Lab Results  Component Value Date   CHOL 202 (H) 11/15/2020   HDL 58 11/15/2020   LDLCALC 135 (H) 11/15/2020   TRIG 52 11/15/2020   CHOLHDL 3.5 11/15/2020   Lab Results  Component Value Date   VD25OH 74.7 11/15/2020   VD25OH 73.12 06/27/2020   VD25OH 44.05 01/09/2020   Lab Results  Component Value Date   WBC 3.9 (L) 12/11/2020   HGB 11.7 (L) 12/11/2020   HCT 34.6 (L) 12/11/2020   MCV 87.4 12/11/2020   PLT 214 12/11/2020   No results found for: IRON, TIBC, FERRITIN  Attestation Statements:   Reviewed by clinician on day of visit: allergies, medications, problem list, medical history, surgical history, family history, social history, and previous encounter notes.   Wilhemena Durie, am acting as transcriptionist for [employer], PA-C.  I have reviewed the above documentation for accuracy and completeness, and I agree with the above. Abby Potash, PA-C

## 2021-01-10 ENCOUNTER — Ambulatory Visit (INDEPENDENT_AMBULATORY_CARE_PROVIDER_SITE_OTHER): Payer: 59 | Admitting: Physician Assistant

## 2021-01-15 ENCOUNTER — Encounter (INDEPENDENT_AMBULATORY_CARE_PROVIDER_SITE_OTHER): Payer: Self-pay

## 2021-01-16 ENCOUNTER — Other Ambulatory Visit: Payer: Self-pay

## 2021-01-16 ENCOUNTER — Ambulatory Visit (INDEPENDENT_AMBULATORY_CARE_PROVIDER_SITE_OTHER): Payer: 59 | Admitting: Family Medicine

## 2021-01-16 ENCOUNTER — Other Ambulatory Visit (HOSPITAL_COMMUNITY): Payer: Self-pay

## 2021-01-16 ENCOUNTER — Encounter (INDEPENDENT_AMBULATORY_CARE_PROVIDER_SITE_OTHER): Payer: Self-pay | Admitting: Family Medicine

## 2021-01-16 VITALS — BP 128/80 | HR 82 | Temp 98.2°F | Ht 65.0 in | Wt 252.0 lb

## 2021-01-16 DIAGNOSIS — Z6841 Body Mass Index (BMI) 40.0 and over, adult: Secondary | ICD-10-CM

## 2021-01-16 DIAGNOSIS — R7303 Prediabetes: Secondary | ICD-10-CM

## 2021-01-16 DIAGNOSIS — E559 Vitamin D deficiency, unspecified: Secondary | ICD-10-CM | POA: Diagnosis not present

## 2021-01-16 MED ORDER — OZEMPIC (2 MG/DOSE) 8 MG/3ML ~~LOC~~ SOPN
2.0000 mg | PEN_INJECTOR | SUBCUTANEOUS | 0 refills | Status: DC
  Filled 2021-01-16: qty 3, 28d supply, fill #0

## 2021-01-16 MED ORDER — VITAMIN D (ERGOCALCIFEROL) 1.25 MG (50000 UNIT) PO CAPS
50000.0000 [IU] | ORAL_CAPSULE | ORAL | 0 refills | Status: DC
Start: 2021-01-16 — End: 2021-02-28
  Filled 2021-01-16: qty 6, fill #0

## 2021-01-16 NOTE — Progress Notes (Signed)
Chief Complaint:   OBESITY Mackenzie [name] is here to discuss her progress with her obesity treatment plan along with follow-up of her obesity related diagnoses. Mackenzie [name] is on the Category 3 Plan and states she is following her eating plan approximately 75% of the time. Mackenzie [name] states she is doing Zumba and walking for 45 minutes 5-6 times per week.  Today's visit was #: 32 Starting weight: 267 lbs Starting date: 08/09/2019 Today's weight: 252 lbs Today's date: 01/16/2021 Total lbs lost to date: 15 lbs Total lbs lost since last in-office visit: 1 lb  Interim History: She is eating all of the prescribed protein. She is exercising consistently and trying to close her Apple watch rings. She is taking her daughter to college tomorrow.   Subjective:   1. Pre-diabetes Mackenzie [name] is on Ozempic 2 mg and notes good appetite suppression. She denies side effects.  Lab Results  Component Value Date   HGBA1C 5.4 11/15/2020   Lab Results  Component Value Date   INSULIN 11.7 11/15/2020   INSULIN 13.3 01/11/2020   INSULIN 12.6 08/09/2019    2. Vitamin D deficiency Mackenzie [name]'s Vitamin D is at goal 74.7. She is on Vitamin D every 14 days.  Lab Results  Component Value Date   VD25OH 74.7 11/15/2020   VD25OH 73.12 06/27/2020   VD25OH 44.05 01/09/2020    Assessment/Plan:   1. Pre-diabetes Mackenzie [name] will continue to work on weight loss, exercise, and decreasing simple We will refill Ozempic 2 mg weekly with no refills. I discussed with Mackenzie [name] we can switch to [facility] if we need to increase the dose.  - Semaglutide, 2 MG/DOSE, (OZEMPIC, 2 MG/DOSE,) 8 MG/3ML SOPN; Inject 2 mg into the skin once a week.  Dispense: 9 mL; Refill: 0  2. Vitamin D deficiency We will refill prescription Vitamin D 50,000 IU every week for 1 month with no refills and Mackenzie [name] will follow-up for routine testing of Vitamin D, at least 2-3 times per year to avoid over-replacement.  - Vitamin D, Ergocalciferol, (DRISDOL) 1.25  MG (50000 UNIT) CAPS capsule; Take 1 capsule (50,000 Units total) by mouth every 14 (fourteen) days.  Dispense: 6 capsule; Refill: 0  3. Obesity: Current BMI 41.93 Mackenzie [name] is currently in the action stage of change. As such, her goal is to continue with weight loss efforts. She has agreed to the Category 3 Plan.   Exercise goals:  As is.  Behavioral modification strategies: decreasing eating out.  Mackenzie [name] has agreed to follow-up with our clinic in 3 weeks with Mackenzie Marble, NP.  Objective:   Blood pressure 128/80, pulse 82, temperature 98.2 F (36.8 C), height '5\' 5"'$  (1.651 m), weight 252 lb (114.3 kg), last menstrual period 02/10/2017, SpO2 99 %, unknown if currently breastfeeding. Body mass index is 41.93 kg/m.  General: Cooperative, alert, well developed, in no acute distress. HEENT: Conjunctivae and lids unremarkable. Cardiovascular: Regular rhythm.  Lungs: Normal work of breathing. Neurologic: No focal deficits.   Lab Results  Component Value Date   CREATININE 0.90 12/11/2020   BUN 15 12/11/2020   NA 134 (L) 12/11/2020   K 3.9 12/11/2020   CL 103 12/11/2020   CO2 25 12/11/2020   Lab Results  Component Value Date   ALT 19 12/11/2020   AST 20 12/11/2020   GGT 21 12/11/2020   ALKPHOS 96 12/11/2020   BILITOT 0.3 12/11/2020   Lab Results  Component Value Date   HGBA1C 5.4 11/15/2020   HGBA1C 5.1 06/27/2020   HGBA1C  5.6 01/09/2020   HGBA1C 5.9 (H) 08/09/2019   HGBA1C 5.9 (H) 07/26/2018   Lab Results  Component Value Date   INSULIN 11.7 11/15/2020   INSULIN 13.3 01/11/2020   INSULIN 12.6 08/09/2019   Lab Results  Component Value Date   TSH 0.657 08/09/2019   Lab Results  Component Value Date   CHOL 202 (H) 11/15/2020   HDL 58 11/15/2020   LDLCALC 135 (H) 11/15/2020   TRIG 52 11/15/2020   CHOLHDL 3.5 11/15/2020   Lab Results  Component Value Date   VD25OH 74.7 11/15/2020   VD25OH 73.12 06/27/2020   VD25OH 44.05 01/09/2020   Lab Results  Component  Value Date   WBC 3.9 (L) 12/11/2020   HGB 11.7 (L) 12/11/2020   HCT 34.6 (L) 12/11/2020   MCV 87.4 12/11/2020   PLT 214 12/11/2020   No results found for: IRON, TIBC, FERRITIN  Attestation Statements:   Reviewed by clinician on day of visit: allergies, medications, problem list, medical history, surgical history, family history, social history, and previous encounter notes.  I, Lizbeth Bark, RMA, am acting as Location manager for [employer], [place].   I have reviewed the above documentation for accuracy and completeness, and I agree with the above. -  Georgianne Fick, FNP

## 2021-01-23 ENCOUNTER — Ambulatory Visit (INDEPENDENT_AMBULATORY_CARE_PROVIDER_SITE_OTHER): Payer: 59 | Admitting: Physician Assistant

## 2021-02-01 ENCOUNTER — Encounter: Payer: Self-pay | Admitting: Plastic Surgery

## 2021-02-01 ENCOUNTER — Other Ambulatory Visit: Payer: Self-pay

## 2021-02-01 ENCOUNTER — Telehealth (INDEPENDENT_AMBULATORY_CARE_PROVIDER_SITE_OTHER): Payer: 59 | Admitting: Plastic Surgery

## 2021-02-01 DIAGNOSIS — N651 Disproportion of reconstructed breast: Secondary | ICD-10-CM | POA: Diagnosis not present

## 2021-02-01 NOTE — Progress Notes (Signed)
   Subjective:    Patient ID: Mackenzie [name], female    DOB: [birth year], 50 y.o.   MRN: [id number]  The patient is a 50 year old female here for follow-up on her breast reconstruction.  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  Her reconstruction was a latissimus with expander followed by an implant from 2020.  She had a mastopexy lift on the right breast.  She has been really diligent about getting into good health and reducing her weight.  She is down to 250 pounds and is hoping to get to 200 pounds.  The left breast is softening up very nicely.  She still has the radiation changes to her skin.  But the skin is much softer.  She has some asymmetry and loss of volume in the medial and superior aspect of the left breast.     Review of Systems  Constitutional: Negative.   HENT: Negative.    Eyes: Negative.   Respiratory: Negative.    Cardiovascular: Negative.   Gastrointestinal: Negative.   Endocrine: Negative.   Genitourinary: Negative.   Neurological: Negative.   Hematological: Negative.       Objective:   Physical Exam Vitals and nursing note reviewed.  Constitutional:      Appearance: Normal appearance.  HENT:     Head: Normocephalic and atraumatic.  Cardiovascular:     Rate and Rhythm: Normal rate.     Pulses: Normal pulses.  Pulmonary:     Effort: Pulmonary effort is normal.  Musculoskeletal:        General: No swelling or tenderness.  Skin:    General: Skin is warm.     Capillary Refill: Capillary refill takes less than 2 seconds.     Coloration: Skin is not jaundiced or pale.     Findings: No bruising.  Neurological:     General: No focal deficit present.     Mental Status: She is alert and oriented to person, place, and time.       Assessment & Plan:     ICD-10-CM   1. Breast asymmetry following reconstructive surgery  N65.1     The patient is doing so well.  I think she would do really well with some fat grafting.  I do recommend  that she wait until she is a little closer to her ideal weight.  She could end up being asymmetric if we do it and then she loses 50 pounds.  The patient is in full agreement.  We will see her back in 6 months.  It was really good to see her. Pictures were obtained of the patient and placed in the chart with the patient's or guardian's permission.

## 2021-02-06 ENCOUNTER — Other Ambulatory Visit: Payer: Self-pay

## 2021-02-06 ENCOUNTER — Ambulatory Visit (INDEPENDENT_AMBULATORY_CARE_PROVIDER_SITE_OTHER): Payer: 59 | Admitting: Adult Health

## 2021-02-06 ENCOUNTER — Encounter (INDEPENDENT_AMBULATORY_CARE_PROVIDER_SITE_OTHER): Payer: Self-pay | Admitting: Adult Health

## 2021-02-06 ENCOUNTER — Other Ambulatory Visit (HOSPITAL_COMMUNITY): Payer: Self-pay

## 2021-02-06 VITALS — BP 127/76 | HR 80 | Temp 98.0°F | Ht 65.0 in | Wt 254.0 lb

## 2021-02-06 DIAGNOSIS — Z6841 Body Mass Index (BMI) 40.0 and over, adult: Secondary | ICD-10-CM | POA: Diagnosis not present

## 2021-02-06 DIAGNOSIS — R7303 Prediabetes: Secondary | ICD-10-CM | POA: Diagnosis not present

## 2021-02-06 DIAGNOSIS — Z9189 Other specified personal risk factors, not elsewhere classified: Secondary | ICD-10-CM

## 2021-02-06 MED ORDER — METFORMIN HCL 500 MG PO TABS
500.0000 mg | ORAL_TABLET | Freq: Two times a day (BID) | ORAL | 0 refills | Status: DC
  Filled 2021-02-06 – 2021-02-20 (×2): qty 180, 90d supply, fill #0

## 2021-02-06 MED ORDER — OZEMPIC (2 MG/DOSE) 8 MG/3ML ~~LOC~~ SOPN
2.0000 mg | PEN_INJECTOR | SUBCUTANEOUS | 0 refills | Status: DC
  Filled 2021-02-06: qty 3, 28d supply, fill #0

## 2021-02-06 NOTE — Progress Notes (Signed)
Chief Complaint:   OBESITY Mackenzie [name] is here to discuss her progress with her obesity treatment plan along with follow-up of her obesity related diagnoses. Mackenzie [name] is on the Category 3 Plan and states she is following her eating plan approximately 80% of the time. Mackenzie [name] states she is Walking/Zumba for 30 minutes 4 times per week.  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   She is day shift - she will consume breakfast prior to her shift, consume dinner at lunch break, then have lunch in the evening. This meal timing has served her well and made it easier to consume all foods on plan.  Subjective:   1. Pre-diabetes On 12/21/2020, BG 83.  On 11/15/2020, A1c 5.4.   She is on metformin 500 mg BID, Ozempic 2 mg once weekly. Family history of diabetes in maternal grandmother and mother.  Lab Results  Component Value Date   HGBA1C 5.4 11/15/2020   Lab Results  Component Value Date   INSULIN 11.7 11/15/2020   INSULIN 13.3 01/11/2020   INSULIN 12.6 08/09/2019   2. At risk for constipation Lecretia is at increased risk for constipation due taking a GLP-1 for diabetes.    Assessment/Plan:   1. Pre-diabetes Refill metformin 500 mg BID. Refill Ozempic 2 mg once weekly.  - Refill metFORMIN (GLUCOPHAGE) 500 MG tablet; Take 1 tablet (500 mg total) by mouth 2 (two) times daily with a meal.  Dispense: 180 tablet; Refill: 0 - Refill Semaglutide, 2 MG/DOSE, (OZEMPIC, 2 MG/DOSE,) 8 MG/3ML SOPN; Inject 2 mg into the skin once a week.  Dispense: 9 mL; Refill: 0  2. At risk for constipation Mackenzie [name] was given approximately 15 minutes of counseling today regarding prevention of constipation. She was encouraged to increase water and fiber  intake.    3. Obesity: Current BMI 42.3  Mackenzie [name] is currently in the action stage of change. As such, her goal is to continue with weight loss efforts. She has agreed to the Category 3 Plan.   Exercise goals:  As is.  Behavioral modification strategies: increasing lean protein intake, decreasing simple carbohydrates, meal planning and cooking strategies, keeping healthy foods in the home, and planning for success.  Mackenzie [name] has agreed to follow-up with our clinic in 2 weeks with Abby Potash, PA-C. She was informed of the importance of frequent follow-up visits to maximize her success with intensive lifestyle modifications for her multiple health conditions.   Objective:   Blood pressure 127/76, pulse 80, temperature 98 F (36.7 C), height '5\' 5"'$  (1.651 m), weight 254 lb (115.2 kg), last menstrual period 02/10/2017, SpO2 98 %, unknown if currently breastfeeding. Body mass index is 42.27 kg/m.  General: Cooperative, alert, well developed, in no acute distress. HEENT: Conjunctivae and lids unremarkable. Cardiovascular: Regular rhythm.  Lungs: Normal work of breathing. Neurologic: No focal deficits.   Lab Results  Component Value Date   CREATININE 0.90 12/11/2020   BUN 15 12/11/2020   NA 134 (L) 12/11/2020   K 3.9 12/11/2020   CL 103 12/11/2020   CO2 25 12/11/2020   Lab Results  Component Value Date   ALT 19 12/11/2020   AST 20 12/11/2020   GGT 21 12/11/2020   ALKPHOS 96 12/11/2020   BILITOT 0.3 12/11/2020  Lab Results  Component Value Date   HGBA1C 5.4 11/15/2020   HGBA1C 5.1 06/27/2020   HGBA1C 5.6 01/09/2020   HGBA1C 5.9 (H) 08/09/2019   HGBA1C 5.9 (H) 07/26/2018   Lab Results  Component Value Date   INSULIN 11.7 11/15/2020   INSULIN 13.3 01/11/2020   INSULIN 12.6 08/09/2019   Lab Results  Component Value Date   TSH 0.657 08/09/2019   Lab Results  Component Value Date   CHOL 202 (H) 11/15/2020   HDL 58 11/15/2020   LDLCALC 135 (H) 11/15/2020   TRIG 52  11/15/2020   CHOLHDL 3.5 11/15/2020   Lab Results  Component Value Date   VD25OH 74.7 11/15/2020   VD25OH 73.12 06/27/2020   VD25OH 44.05 01/09/2020   Lab Results  Component Value Date   WBC 3.9 (L) 12/11/2020   HGB 11.7 (L) 12/11/2020   HCT 34.6 (L) 12/11/2020   MCV 87.4 12/11/2020   PLT 214 12/11/2020   Attestation Statements:   Reviewed by clinician on day of visit: allergies, medications, problem list, medical history, surgical history, family history, social history, and previous encounter notes.  I, Water quality scientist, CMA, am acting as Location manager for [name], NP.  I have reviewed the above documentation for accuracy and completeness, and I agree with the above. -  Lyana Asbill d. Forrester Blando, NP-C

## 2021-02-07 ENCOUNTER — Other Ambulatory Visit (HOSPITAL_COMMUNITY): Payer: Self-pay

## 2021-02-07 ENCOUNTER — Other Ambulatory Visit (INDEPENDENT_AMBULATORY_CARE_PROVIDER_SITE_OTHER): Payer: Self-pay | Admitting: Adult Health

## 2021-02-07 MED ORDER — TRULICITY 1.5 MG/0.5ML ~~LOC~~ SOAJ
1.5000 mg | SUBCUTANEOUS | 0 refills | Status: DC
  Filled 2021-02-07: qty 2, 28d supply, fill #0

## 2021-02-07 MED ORDER — VICTOZA 18 MG/3ML ~~LOC~~ SOPN
1.8000 mg | PEN_INJECTOR | Freq: Every day | SUBCUTANEOUS | 0 refills | Status: DC
  Filled 2021-02-07: qty 9, 30d supply, fill #0

## 2021-02-08 ENCOUNTER — Other Ambulatory Visit (HOSPITAL_COMMUNITY): Payer: Self-pay

## 2021-02-14 ENCOUNTER — Other Ambulatory Visit (HOSPITAL_COMMUNITY): Payer: Self-pay

## 2021-02-15 ENCOUNTER — Other Ambulatory Visit: Payer: Self-pay | Admitting: Oncology

## 2021-02-15 ENCOUNTER — Other Ambulatory Visit (HOSPITAL_COMMUNITY): Payer: Self-pay

## 2021-02-15 ENCOUNTER — Encounter: Payer: Self-pay | Admitting: Oncology

## 2021-02-15 MED ORDER — GABAPENTIN 300 MG PO CAPS
300.0000 mg | ORAL_CAPSULE | Freq: Every day | ORAL | 4 refills | Status: DC
  Filled 2021-02-15: qty 90, 90d supply, fill #0

## 2021-02-20 ENCOUNTER — Other Ambulatory Visit: Payer: Self-pay | Admitting: *Deleted

## 2021-02-20 ENCOUNTER — Other Ambulatory Visit (HOSPITAL_COMMUNITY): Payer: Self-pay

## 2021-02-20 DIAGNOSIS — Z170 Estrogen receptor positive status [ER+]: Secondary | ICD-10-CM

## 2021-02-20 DIAGNOSIS — C50212 Malignant neoplasm of upper-inner quadrant of left female breast: Secondary | ICD-10-CM

## 2021-02-23 DIAGNOSIS — G629 Polyneuropathy, unspecified: Secondary | ICD-10-CM | POA: Diagnosis not present

## 2021-02-23 DIAGNOSIS — I878 Other specified disorders of veins: Secondary | ICD-10-CM | POA: Diagnosis not present

## 2021-02-23 DIAGNOSIS — M79661 Pain in right lower leg: Secondary | ICD-10-CM | POA: Diagnosis not present

## 2021-02-23 DIAGNOSIS — L03115 Cellulitis of right lower limb: Secondary | ICD-10-CM | POA: Diagnosis not present

## 2021-02-27 ENCOUNTER — Telehealth: Payer: Self-pay | Admitting: *Deleted

## 2021-02-27 ENCOUNTER — Other Ambulatory Visit (HOSPITAL_COMMUNITY): Payer: Self-pay

## 2021-02-27 ENCOUNTER — Ambulatory Visit (INDEPENDENT_AMBULATORY_CARE_PROVIDER_SITE_OTHER): Payer: 59 | Admitting: Adult Health

## 2021-02-27 ENCOUNTER — Other Ambulatory Visit: Payer: Self-pay

## 2021-02-27 ENCOUNTER — Encounter (INDEPENDENT_AMBULATORY_CARE_PROVIDER_SITE_OTHER): Payer: Self-pay | Admitting: Adult Health

## 2021-02-27 VITALS — BP 122/80 | HR 81 | Temp 98.1°F | Ht 65.0 in | Wt 247.0 lb

## 2021-02-27 DIAGNOSIS — R7303 Prediabetes: Secondary | ICD-10-CM

## 2021-02-27 DIAGNOSIS — Z9189 Other specified personal risk factors, not elsewhere classified: Secondary | ICD-10-CM | POA: Diagnosis not present

## 2021-02-27 DIAGNOSIS — Z6841 Body Mass Index (BMI) 40.0 and over, adult: Secondary | ICD-10-CM | POA: Diagnosis not present

## 2021-02-27 DIAGNOSIS — E559 Vitamin D deficiency, unspecified: Secondary | ICD-10-CM | POA: Diagnosis not present

## 2021-02-27 MED ORDER — OZEMPIC (2 MG/DOSE) 8 MG/3ML ~~LOC~~ SOPN
2.0000 mg | PEN_INJECTOR | SUBCUTANEOUS | 0 refills | Status: DC
  Filled 2021-02-27: qty 9, 84d supply, fill #0

## 2021-02-27 NOTE — Telephone Encounter (Signed)
Mackenzie [name];  Left message for patient reminding her of Lab appointment this Friday morning at 8 am and to fast for at least 8 hrs prior to lab and to not take her Letrozole until after labs are drawn.  Reminded her of appt on Monday morning at 8:30 am to see [name] and to remember to bring in completed diaries and medication bottles for that visit. Informed patient of new consent addendum for the study that will need to be reviewed and signed this Friday before her lab appointment if possible. Informed patient that I emailed her a copy of the consent for her review. Informed her that this nurse will be out of the office the next two days and another research nurse, Lexine Baton, will cover for me and go over the consent this Friday. Gave her Nikki's phone number in case of any questions. Patient left a message confirming she got my message above. No questions.  Foye Spurling, BSN, RN Clinical Research Nurse 02/27/2021 10:56 AM

## 2021-02-27 NOTE — Progress Notes (Signed)
Chief Complaint:   OBESITY Mackenzie [name] is here to discuss her progress with her obesity treatment plan along with follow-up of her obesity related diagnoses. Mackenzie [name] is on the Category 3 Plan and states she is following her eating plan approximately 70% of the time. Mackenzie [name] states she is doing Zumba for 45 minutes 2 times per week.  Today's visit was #: 36 Starting weight: 267 lbs Starting date: 08/09/2019 Today's weight: 247 lbs Today's date: 02/27/2021 Total lbs lost to date: 20 lbs Total lbs lost since last in-office visit: 7 lbs  Interim History: Mackenzie [name] is taking Ozempic 2 mg subcutaneously weekly - refilled 02/06/2021. She went to pick up Ozempic on 02/15/2021 - pharmacy provided Trulicity 1.5 mg Systems developer) to bridge her until Ozempic was back in stock. She remained on Ozempic 2 mg (she had extra medication at home) - she did not pick up Trulicity. Called pharmacy and Ozempic in now available.  Subjective:   1. Vitamin D deficiency Vitamin D level on 11/15/2020 - 74.7.  Lab Results  Component Value Date   VD25OH 74.7 11/15/2020   VD25OH 73.12 06/27/2020   VD25OH 44.05 01/09/2020   2. Pre-diabetes Mackenzie [name] is taking Ozempic 2 mg subcutaneously weekly - refilled 02/06/2021. She went to pick up Ozempic on 02/15/2021 - pharmacy provided Trulicity 1.5 mg Systems developer) to bridge her until Ozempic was back in stock. She remained on Ozempic 2 mg (she had extra medication at home) - she did not pick up Trulicity. Called pharmacy and Ozempic in now available.  Lab Results  Component Value Date   HGBA1C 5.4 11/15/2020   Lab Results  Component Value Date   INSULIN 11.7 11/15/2020   INSULIN 13.3 01/11/2020   INSULIN 12.6 08/09/2019   3. At risk for diabetes mellitus Mackenzie [name] is at higher than average risk for developing diabetes due to prediabetes.  Assessment/Plan:   1. Vitamin D deficiency Check labs today.  Refill ergocalciferol if appropriate.  - VITAMIN D 25  Hydroxy (Vit-D Deficiency, Fractures)  2. Pre-diabetes Refill Ozempic 2 mg once weekly, as per below. Check labs today.  - Refill Semaglutide, 2 MG/DOSE, (OZEMPIC, 2 MG/DOSE,) 8 MG/3ML SOPN; Inject 2 mg into the skin once a week.  Dispense: 9 mL; Refill: 0 - Comprehensive metabolic panel - Hemoglobin A1c - Insulin, random  3. At risk for diabetes mellitus Mackenzie [name] was given approximately 15 minutes of diabetes education and counseling today. We discussed intensive lifestyle modifications today with an emphasis on weight loss as well as increasing exercise and decreasing simple carbohydrates in her diet. We also reviewed medication options with an emphasis on risk versus benefit of those discussed.   Repetitive spaced learning was employed today to elicit superior memory formation and behavioral change.   4. Obesity: Current BMI 41.2  Mackenzie [name] is currently in the action stage of change. As such, her goal is to continue with weight loss efforts. She has agreed to the Category 3 Plan.   Exercise goals:  As is.  Behavioral modification strategies: increasing lean protein intake, decreasing simple carbohydrates, meal planning and cooking strategies, keeping healthy foods in the home, and planning for success.  Mackenzie [name] has agreed to follow-up with our clinic in 3-4 weeks. She was informed of the importance of frequent follow-up visits to maximize her success with intensive lifestyle modifications for her multiple health conditions.   Objective:   Blood pressure 122/80, pulse 81, temperature 98.1 F (36.7 C), height 5\' 5"  (1.651 m), weight 247 lb (  112 kg), last menstrual period 02/10/2017, SpO2 98 %, unknown if currently breastfeeding. Body mass index is 41.1 kg/m.  General: Cooperative, alert, well developed, in no acute distress. HEENT: Conjunctivae and lids unremarkable. Cardiovascular: Regular rhythm.  Lungs: Normal work of breathing. Neurologic: No focal deficits.   Lab Results   Component Value Date   CREATININE 0.90 12/11/2020   BUN 15 12/11/2020   NA 134 (L) 12/11/2020   K 3.9 12/11/2020   CL 103 12/11/2020   CO2 25 12/11/2020   Lab Results  Component Value Date   ALT 19 12/11/2020   AST 20 12/11/2020   GGT 21 12/11/2020   ALKPHOS 96 12/11/2020   BILITOT 0.3 12/11/2020   Lab Results  Component Value Date   HGBA1C 5.4 11/15/2020   HGBA1C 5.1 06/27/2020   HGBA1C 5.6 01/09/2020   HGBA1C 5.9 (H) 08/09/2019   HGBA1C 5.9 (H) 07/26/2018   Lab Results  Component Value Date   INSULIN 11.7 11/15/2020   INSULIN 13.3 01/11/2020   INSULIN 12.6 08/09/2019   Lab Results  Component Value Date   TSH 0.657 08/09/2019   Lab Results  Component Value Date   CHOL 202 (H) 11/15/2020   HDL 58 11/15/2020   LDLCALC 135 (H) 11/15/2020   TRIG 52 11/15/2020   CHOLHDL 3.5 11/15/2020   Lab Results  Component Value Date   VD25OH 74.7 11/15/2020   VD25OH 73.12 06/27/2020   VD25OH 44.05 01/09/2020   Lab Results  Component Value Date   WBC 3.9 (L) 12/11/2020   HGB 11.7 (L) 12/11/2020   HCT 34.6 (L) 12/11/2020   MCV 87.4 12/11/2020   PLT 214 12/11/2020   Attestation Statements:   Reviewed by clinician on day of visit: allergies, medications, problem list, medical history, surgical history, family history, social history, and previous encounter notes.  I, Water quality scientist, CMA, am acting as Location manager for [name], NP.  I have reviewed the above documentation for accuracy and completeness, and I agree with the above. -  Nery Kalisz d. Tarika Mckethan, NP-C

## 2021-02-28 ENCOUNTER — Encounter: Payer: Self-pay | Admitting: Oncology

## 2021-02-28 ENCOUNTER — Encounter (INDEPENDENT_AMBULATORY_CARE_PROVIDER_SITE_OTHER): Payer: Self-pay | Admitting: Adult Health

## 2021-02-28 ENCOUNTER — Other Ambulatory Visit (INDEPENDENT_AMBULATORY_CARE_PROVIDER_SITE_OTHER): Payer: Self-pay | Admitting: Adult Health

## 2021-02-28 LAB — COMPREHENSIVE METABOLIC PANEL
ALT: 18 IU/L (ref 0–32)
AST: 20 IU/L (ref 0–40)
Albumin/Globulin Ratio: 1.5 (ref 1.2–2.2)
Albumin: 4.5 g/dL (ref 3.8–4.9)
Alkaline Phosphatase: 109 IU/L (ref 44–121)
BUN/Creatinine Ratio: 16 (ref 9–23)
BUN: 16 mg/dL (ref 6–24)
Bilirubin Total: 0.3 mg/dL (ref 0.0–1.2)
CO2: 21 mmol/L (ref 20–29)
Calcium: 9.6 mg/dL (ref 8.7–10.2)
Chloride: 100 mmol/L (ref 96–106)
Creatinine, Ser: 1.03 mg/dL — ABNORMAL HIGH (ref 0.57–1.00)
Globulin, Total: 3 g/dL (ref 1.5–4.5)
Glucose: 80 mg/dL (ref 65–99)
Potassium: 4.5 mmol/L (ref 3.5–5.2)
Sodium: 137 mmol/L (ref 134–144)
Total Protein: 7.5 g/dL (ref 6.0–8.5)
eGFR: 65 mL/min/{1.73_m2} (ref >59)

## 2021-02-28 LAB — INSULIN, RANDOM: INSULIN: 15.1 u[IU]/mL (ref 2.6–24.9)

## 2021-02-28 LAB — HEMOGLOBIN A1C
Est. average glucose Bld gHb Est-mCnc: 108 mg/dL
Hgb A1c MFr Bld: 5.4 % (ref 4.8–5.6)

## 2021-02-28 LAB — VITAMIN D 25 HYDROXY (VIT D DEFICIENCY, FRACTURES): Vit D, 25-Hydroxy: 83 ng/mL (ref 30.0–100.0)

## 2021-03-01 ENCOUNTER — Inpatient Hospital Stay: Payer: 59 | Attending: Adult Health

## 2021-03-01 ENCOUNTER — Other Ambulatory Visit: Payer: Self-pay

## 2021-03-01 ENCOUNTER — Encounter: Payer: Self-pay | Admitting: *Deleted

## 2021-03-01 DIAGNOSIS — C50212 Malignant neoplasm of upper-inner quadrant of left female breast: Secondary | ICD-10-CM

## 2021-03-01 DIAGNOSIS — C50812 Malignant neoplasm of overlapping sites of left female breast: Secondary | ICD-10-CM | POA: Diagnosis not present

## 2021-03-01 DIAGNOSIS — Z170 Estrogen receptor positive status [ER+]: Secondary | ICD-10-CM

## 2021-03-01 LAB — CMP (CANCER CENTER ONLY)
ALT: 17 U/L (ref 0–44)
AST: 19 U/L (ref 15–41)
Albumin: 3.8 g/dL (ref 3.5–5.0)
Alkaline Phosphatase: 107 U/L (ref 38–126)
Anion gap: 7 (ref 5–15)
BUN: 17 mg/dL (ref 6–20)
CO2: 24 mmol/L (ref 22–32)
Calcium: 9.3 mg/dL (ref 8.9–10.3)
Chloride: 103 mmol/L (ref 98–111)
Creatinine: 0.88 mg/dL (ref 0.44–1.00)
GFR, Estimated: >60 mL/min (ref >60)
Glucose, Bld: 80 mg/dL (ref 70–99)
Potassium: 3.9 mmol/L (ref 3.5–5.1)
Sodium: 134 mmol/L — ABNORMAL LOW (ref 135–145)
Total Bilirubin: 0.3 mg/dL (ref 0.3–1.2)
Total Protein: 7.7 g/dL (ref 6.5–8.1)

## 2021-03-01 LAB — CBC WITH DIFFERENTIAL (CANCER CENTER ONLY)
Abs Immature Granulocytes: 0 10*3/uL (ref 0.00–0.07)
Basophils Absolute: 0.1 10*3/uL (ref 0.0–0.1)
Basophils Relative: 1 %
Eosinophils Absolute: 0.1 10*3/uL (ref 0.0–0.5)
Eosinophils Relative: 1 %
HCT: 35 % — ABNORMAL LOW (ref 36.0–46.0)
Hemoglobin: 11.6 g/dL — ABNORMAL LOW (ref 12.0–15.0)
Immature Granulocytes: 0 %
Lymphocytes Relative: 32 %
Lymphs Abs: 1.3 10*3/uL (ref 0.7–4.0)
MCH: 29.1 pg (ref 26.0–34.0)
MCHC: 33.1 g/dL (ref 30.0–36.0)
MCV: 87.7 fL (ref 80.0–100.0)
Monocytes Absolute: 0.5 10*3/uL (ref 0.1–1.0)
Monocytes Relative: 12 %
Neutro Abs: 2.2 10*3/uL (ref 1.7–7.7)
Neutrophils Relative %: 54 %
Platelet Count: 255 10*3/uL (ref 150–400)
RBC: 3.99 MIL/uL (ref 3.87–5.11)
RDW: 13.4 % (ref 11.5–15.5)
WBC Count: 4 10*3/uL (ref 4.0–10.5)
nRBC: 0 % (ref 0.0–0.2)

## 2021-03-01 LAB — LACTATE DEHYDROGENASE: LDH: 181 U/L (ref 98–192)

## 2021-03-01 LAB — AMYLASE: Amylase: 71 U/L (ref 28–100)

## 2021-03-01 LAB — MAGNESIUM: Magnesium: 1.8 mg/dL (ref 1.7–2.4)

## 2021-03-01 LAB — RESEARCH LABS

## 2021-03-01 LAB — LIPASE, BLOOD: Lipase: 32 U/L (ref 11–51)

## 2021-03-01 LAB — GAMMA GT: GGT: 19 U/L (ref 7–50)

## 2021-03-01 LAB — BILIRUBIN, DIRECT: Bilirubin, Direct: <0.1 mg/dL (ref 0.0–0.2)

## 2021-03-01 LAB — URIC ACID: Uric Acid, Serum: 3.5 mg/dL (ref 2.5–7.1)

## 2021-03-01 LAB — PHOSPHORUS: Phosphorus: 3.6 mg/dL (ref 2.5–4.6)

## 2021-03-01 NOTE — Research (Signed)
TRIAL NAME: [name]  [name] currently participates in the above listed study.  This Clinical Research Nurse met with Mackenzie [name], [id number], on 03/01/21 in a manner and location that ensures patient privacy to discuss reconsent. Patient is Unaccompanied.  A copy of the informed consent document was provided to the patient.  Patient reads, speaks, and understands [place].    Consent amendments and changes were presented by this clinical research Nurse. [name] was provided an opportunity to ask questions and all questions were answered to patient's satisfaction.  Patient has been previously provided a copy of the addendum via email.  The updated informed consent was reviewed page by page.  The patient's mental and emotional status is appropriate to provide informed consent, and the patient verbalizes an interest in continuing voluntary study participation.  Patient has voluntarily signed consent Addendum #1 to ICF v5.1 (IRB approval date Aug, 16, 2022) on 03/01/21 at 08:30AM.  Documents was likewise taken to Dr. Lindi Adie at this time for signature, and was signed by Dr. Lindi Adie.  Dr. Lindi Adie and patient did not meet together, patient did not have any questions for Dr. Lindi Adie.  The patient was provided with a copy of the signed informed consent form for their reference.  No study specific procedures were obtained prior to the signing of the informed consent document.  Approximately 15 minutes were spent with the patient reviewing the informed consent documents.    Patient proceeded to the lab for collection of trial required laboratory tests.  Patient confirmed she is in a fasting state.  Patient has direct contact information of Research Nurse, Foye Spurling and is encouraged to call with any questions.   Doreatha Martin, RN, BSN, [facility] 03/01/2021 9:29 AM

## 2021-03-02 ENCOUNTER — Encounter: Payer: Self-pay | Admitting: Oncology

## 2021-03-02 LAB — FOLLICLE STIMULATING HORMONE: FSH: 16.2 m[IU]/mL

## 2021-03-04 ENCOUNTER — Encounter: Payer: Self-pay | Admitting: *Deleted

## 2021-03-04 ENCOUNTER — Inpatient Hospital Stay (HOSPITAL_BASED_OUTPATIENT_CLINIC_OR_DEPARTMENT_OTHER): Payer: 59 | Admitting: Adult Health

## 2021-03-04 ENCOUNTER — Encounter: Payer: Self-pay | Admitting: Adult Health

## 2021-03-04 ENCOUNTER — Other Ambulatory Visit: Payer: Self-pay

## 2021-03-04 ENCOUNTER — Encounter: Payer: Self-pay | Admitting: Oncology

## 2021-03-04 VITALS — BP 122/71 | HR 82 | Temp 97.5°F | Resp 19 | Ht 65.0 in | Wt 250.8 lb

## 2021-03-04 DIAGNOSIS — C50812 Malignant neoplasm of overlapping sites of left female breast: Secondary | ICD-10-CM

## 2021-03-04 DIAGNOSIS — C50912 Malignant neoplasm of unspecified site of left female breast: Secondary | ICD-10-CM

## 2021-03-04 DIAGNOSIS — C773 Secondary and unspecified malignant neoplasm of axilla and upper limb lymph nodes: Secondary | ICD-10-CM

## 2021-03-04 DIAGNOSIS — Z006 Encounter for examination for normal comparison and control in clinical research program: Secondary | ICD-10-CM

## 2021-03-04 DIAGNOSIS — Z170 Estrogen receptor positive status [ER+]: Secondary | ICD-10-CM

## 2021-03-04 DIAGNOSIS — C50212 Malignant neoplasm of upper-inner quadrant of left female breast: Secondary | ICD-10-CM

## 2021-03-04 NOTE — Progress Notes (Addendum)
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  Her study nurse was also present during the visit  [employer] is on study with [name]-- taking Ribociclb and Letrozole daily.  She is tolerating these medications without any major side effects that she is aware of.    Bennetta continues to forego Goserelin and her estradiol today is pending.  Her most recent estradiol was less than 2.5.  She says that her main issue today is her anxiety and wants to know if there is anything she can take for it.  She says that her husband is having some heart issues and it has been stressful for her to manage.    Her most recent bone density screening on 03/16/2018, showed a T-score of -0.7, which is considered normal. She is scheduled for repeat on 03/20/2021.  Her most recent right screening mammography with tomography was completed at [facility] on 10/15/2020 showing: breast density category C; no evidence of malignancy.    REVIEW OF SYSTEMS: Review of Systems  Constitutional:  Negative for appetite change, chills, fatigue, fever and unexpected weight change.  HENT:   Negative for hearing loss, lump/mass and trouble swallowing.   Eyes:  Negative for eye problems and  icterus.  Respiratory:  Negative for chest tightness, cough and shortness of breath.   Cardiovascular:  Negative for chest pain, leg swelling and palpitations.  Gastrointestinal:  Negative for abdominal distention, abdominal pain, constipation, diarrhea, nausea and vomiting.  Endocrine: Negative for hot flashes.  Genitourinary:  Negative for difficulty urinating.   Musculoskeletal:  Negative for arthralgias.  Skin:  Negative for itching and rash.  Neurological:  Negative for dizziness, extremity weakness, headaches and numbness.  Hematological:  Negative for adenopathy. Does not bruise/bleed easily.  Psychiatric/Behavioral:  Negative for depression. The patient is nervous/anxious.      COVID 19 VACCINATION STATUS: Status post Pfizer x2 with booster September 2021   HISTORY OF CURRENT ILLNESS: From the original intake note:  The patient herself noted some changes in her left breast late July 2018, she says, and eventually brought this to medical attention so that on 01/09/2017 she underwent bilateral diagnostic mammography with tomography and left breast ultrasonography at the [facility] Center. This found the breast density to be category C. In the upper inner quadrant of the left breast there was an area of asymmetry and there were malignant type calcifications involving all 4 quadrants. On exam there is firmness and palpable thickening in the anterior left breast with skin dimpling. Ultrasonography found at the 9:30 o'clock radiant 3 cm from the nipple a 2.7 cm mass and in the left axilla for abnormal lymph nodes largest of which measured 2.7 cm.  Biopsy of the left breast 9:30 o'clock mass 80 01/26/2017 showed ([id number]) invasive ductal carcinoma, with extracellular mucin, grade 1 or 2. In the lower outer left breast  is separated biopsy the same day showed ductal carcinoma in situ. One of the 4 lymph nodes involved was positive for metastatic carcinoma. Prognostic panel on the invasive disease  showed it to be estrogen receptor 100% positive, progesterone receptor 20% positive, both with strong staining intensity, with an MIB-1 of 20%, and no HER-2 amplification with a signals ratio 1.38 and the number per cell 2.90.  On 01/22/2017 the patient underwent bilateral breast MRI. This showed no involvement of the right breast, but in the left breast there was a masslike and non-masslike enhancement involving all quadrants, with skin swelling but no abnormal enhancement of the skin or nipple areolar complex or pectoralis muscle. The mass could not be clearly measured but spanned approximately 12 cm. There was bulky left axillary lymphadenopathy, with the largest lymph node measuring up to 3.4 cm.  The patient's subsequent history is as detailed below.   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, left (HCC) 01/23/2017   Edema, lower extremity    Hot flashes 03/2017   Hyperlipidemia 03/20/2016   Morbid obesity with body mass index (BMI) of 40.0 to 49.9 (HCC) 09/17/2017   NCGS (non-celiac gluten sensitivity)    Pre-diabetes 07/26/2018   Hgb A1C elevated on 07/26/2018, Gestational Diabetes 2012   Seasonal allergies 2012   seasonal allergies causes allergic rhinitis and itchy, dry eyes per pt   Vitamin D deficiency 05/2015    PAST SURGICAL HISTORY: Past Surgical History:  Procedure Laterality Date   BREAST RECONSTRUCTION WITH PLACEMENT OF TISSUE EXPANDER AND FLEX HD (ACELLULAR HYDRATED DERMIS) Left 08/27/2017   Procedure: LEFT BREAST RECONSTRUCTION WITH PLACEMENT OF TISSUE EXPANDER AND FLEX HD;  Surgeon: Peggye Form, DO;  Location: [facility];  Service: Plastics;  Laterality: Left;   CESAREAN SECTION     x2   LATISSIMUS  FLAP TO BREAST Left 11/03/2018   LATISSIMUS FLAP TO BREAST Left 11/03/2018   Procedure: LATISSIMUS FLAP TO LEFT BREAST;  Surgeon: Peggye Form, DO;  Location: [facility];  Service: Plastics;  Laterality: Left;   MASTECTOMY MODIFIED RADICAL Left 08/27/2017   MASTECTOMY MODIFIED RADICAL Left 08/27/2017   Procedure: LEFT MODIFIED RADICAL MASTECTOMY;  Surgeon: Harriette Bouillon, MD;  Location: [facility];  Service: General;  Laterality: Left;   MASTOPEXY Right 03/03/2019   Procedure: RIGHT BREAST MASTOPEXY/REDUCTION;  Surgeon: Peggye Form, DO;  Location: [facility];  Service: Plastics;  Laterality: Right;  3 hours, please   PORT-A-CATH REMOVAL Right 08/27/2017   Procedure: REMOVAL PORT-A-CATH RIGHT CHEST;  Surgeon: Harriette Bouillon, MD;  Location: [facility];  Service: General;  Laterality: Right;   PORTACATH PLACEMENT Right 01/28/2017   Procedure: INSERTION PORT-A-CATH WITH ULTRASOUND;  Surgeon: Harriette Bouillon, MD;  Location: [name];  Service: General;  Laterality: Right;   REDUCTION MAMMAPLASTY Right    REMOVAL OF TISSUE EXPANDER AND PLACEMENT OF IMPLANT Left 03/03/2019   Procedure: REMOVAL OF TISSUE EXPANDER AND PLACEMENT OF EXPANDER;  Surgeon: Peggye Form, DO;  Location: [facility];  Service: Plastics;  Laterality: Left;   REMOVAL OF TISSUE EXPANDER AND PLACEMENT OF IMPLANT Left 05/25/2019   Procedure: LEFT BREAST REMOVAL OF TISSUE EXPANDER AND PLACEMENT OF IMPLANT;  Surgeon: Wallace Going, DO;  Location: [facility];  Service: Plastics;  Laterality: Left;   TISSUE EXPANDER PLACEMENT Left 11/03/2018   Procedure: PLACEMENT OF TISSUE EXPANDER LEFT BREAST;  Surgeon: Wallace Going, DO;  Location: [facility];  Service: Plastics;  Laterality: Left;  Total case time is 3.5 hours   TUBAL LIGATION Bilateral 01/21/2011    FAMILY HISTORY Family History  Problem Relation Age of Onset   Breast cancer Paternal Grandmother 98        d.60s from breast cancer. Did not have treatment.   Other Mother        W.44 from complications of surgery to remove brain tumor   Diabetes Mother    Hypertension Mother    Stroke Mother    Kidney disease Father    Hypertension Other    Diabetes Other    Stroke Other   The patient's father still alive at age 20. The patient's mother died with a brain tumor which the patient says was "benign". She was 56. The patient has one brother, no sisters. The only breast cancer in the family is a paternal grandmother who died from breast cancer at an unknown age. There is no history of ovarian or prostate cancer in the family.   GYNECOLOGIC HISTORY:  Patient's last menstrual period was 02/10/2017. Menarche age 34 and first live birth age 51 she is [place] P4. The patient is still having regular periods as of August 2018   SOCIAL HISTORY: (Updated 05/31/2018) Asanti works at [employer] on the fifth floor, [place]. She is separated from her husband (as of 05/2018), [name], who is a Building control surveyor.  1 daughter is married and lives in [place] and works as a Education administrator. Kelyn's second daughter, [name], lives in [place] and works in the police department. Daughters, [place] and [place] are 29 and 6, living at home. The patient has a grandson she looks after. She attends a [employer].     ADVANCED DIRECTIVES: At the 05/03/2020 visit the patient tells me she has named her daughter [name] as her healthcare power of attorney.  [name] may be reached at [place]: Social History   Tobacco Use   Smoking status: Never   Smokeless tobacco: Never  Vaping Use   Vaping Use: Never used  Substance Use Topics   Alcohol use: Yes    Comment: social   Drug use: No    Colonoscopy: Never  PAP: December 2017  Bone density: Never   Allergies  Allergen Reactions   Dilaudid [Hydromorphone] Itching    Current Outpatient Medications  Medication Sig Dispense Refill    acetaminophen (TYLENOL) 500 MG tablet Take 1,000 mg by mouth every 6 (six) hours as needed (for pain/headaches.).      B Complex Vitamins (VITAMIN B COMPLEX PO) Take 1 capsule by mouth 2 (two) times daily.      Calcium Carb-Cholecalciferol (CALCIUM 600+D3 PO) Take 1 tablet by mouth 2 (two) times daily.      cetirizine (ZYRTEC) 10 MG tablet Take 10 mg by mouth daily.     gabapentin (NEURONTIN) 300 MG capsule Take 1 capsule (300 mg total) by mouth at bedtime. 90 capsule 4   Investigational ribociclib (KISQALI) 200 MG tablet [name] Take 2 tablets (400 mg total) by mouth daily. Take 2 tablets ($RemoveBe'400mg'HFPILvMVA$  total) by mouth daily on days 1-21.  Repeat every 28 days. 225 tablet 0   letrozole (FEMARA) 2.5 MG tablet TAKE 1 TABLET BY MOUTH  DAILY. 90 tablet 4   loratadine (CLARITIN) 10 MG tablet Take 1 tablet (10 mg total) by mouth daily. (Patient taking differently: Take 10 mg by mouth daily as needed for allergies.) 90 tablet 2   metFORMIN (GLUCOPHAGE) 500 MG tablet Take 1 tablet (500 mg total) by mouth 2 (two) times daily with a meal. 180 tablet 0   Multiple Vitamin (MULTIVITAMIN WITH MINERALS) TABS tablet Take 1 tablet by mouth daily.      Omega-3 Fatty Acids (FISH OIL) 1000 MG CAPS Take 1,000 mg by mouth 2 (two) times daily.      omeprazole (PRILOSEC) 40 MG capsule Take 1 capsule (40 mg total) by mouth daily. 60 capsule 1   phenylephrine (SUDAFED PE) 10 MG TABS tablet Take 10 mg by mouth every 4 (four) hours as needed.      Probiotic Product (PROBIOTIC DAILY PO) Take 1 capsule by mouth 2 (two) times daily.      Semaglutide, 2 MG/DOSE, (OZEMPIC, 2 MG/DOSE,) 8 MG/3ML SOPN Inject 2 mg into the skin once a week. 9 mL 0   ZINC OXIDE PO Take 1 tablet by mouth 2 (two) times a day.     No current facility-administered medications for this visit.   Facility-Administered Medications Ordered in Other Visits  Medication Dose Route Frequency Provider Last Rate Last Admin   sodium chloride flush (NS) 0.9 %  injection 10 mL  10 mL Intravenous PRN Magrinat, [name], MD   10 mL at 03/10/17 1239    OBJECTIVE: African-American woman in no acute distress  Vitals:   03/04/21 0831  BP: 122/71  Pulse: 82  Resp: 19  Temp: (!) 97.5 F (36.4 C)  SpO2: 99%   Wt Readings from Last 3 Encounters:  03/04/21 250 lb 12.8 oz (113.8 kg)  02/27/21 247 lb (112 kg)  02/06/21 254 lb (115.2 kg)   Body mass index is 41.74 kg/m.   ECOG FS: 1  GENERAL: Patient is a well appearing female in no acute distress HEENT:  Sclerae anicteric.  Oropharynx clear and moist. No ulcerations or evidence of oropharyngeal candidiasis. Neck is supple.  NODES:  No cervical, supraclavicular, or axillary lymphadenopathy palpated.  BREAST EXAM: Right breast s/p reduction, benign, left breast s/p mastectomy, reconstruction and radiation, no sign of local recurrence LUNGS:  Clear to auscultation bilaterally.  No wheezes or rhonchi. HEART:  Regular rate and rhythm. No murmur appreciated. ABDOMEN:  Soft, nontender.  Positive, normoactive bowel sounds. No organomegaly palpated. MSK:  No focal spinal tenderness to palpation. Full range of motion bilaterally in the upper extremities. EXTREMITIES:  No peripheral edema.   SKIN:  Clear with no obvious rashes or skin changes. No nail dyscrasia. NEURO:  Nonfocal. Well oriented.  Appropriate affect.    LAB RESULTS:  No visits with results within 3 Day(s) from this visit.  Latest known visit with results is:  Appointment on 03/01/2021  Component Date Value Ref Range Status   [facility] 03/01/2021 16.2  mIU/mL Final   Comment: (NOTE)                    Adult Female:                      Follicular phase      3.5 -  12.5                      Ovulation phase       4.7 -  21.5                      Luteal phase          1.7 -   7.7                      Postmenopausal       25.8 - 134.8 Performed At: [facility] [address] [place], [place] [id number] Rush Farmer MD  HU:[id number]    [facility] 03/01/2021 Collected by Laboratory   Final   Performed at [facility], [place] [address] [place], [place] [address]   Uric Acid, Serum 03/01/2021 3.5  2.5 - 7.1 mg/dL Final   Performed at [facility], [place] [address] [place], [place] [address]   Phosphorus 03/01/2021 3.6  2.5 - 4.6 mg/dL Final   Performed at [facility] [address], [place], [place] [address]   Magnesium 03/01/2021 1.8  1.7 - 2.4 mg/dL Final   Performed at [facility], [place] [address] [place], [place] [address]   Lipase 03/01/2021 32  11 - 51 U/L Final   Performed at [facility], [place] [address], [place],  [address]   LDH 03/01/2021 181  98 - 192 U/L Final   Performed at [facility], [place] [address] [place], [place] [address]   GGT 03/01/2021 19  7 - 50 U/L Final   Performed at [facility], [place] [address] [place], [place] [address]   Sodium 03/01/2021 134 (A) 135 - 145 mmol/L Final   Potassium 03/01/2021 3.9  3.5 - 5.1 mmol/L Final   Chloride 03/01/2021 103  98 - 111 mmol/L Final   CO2 03/01/2021 24  22 - 32 mmol/L Final   Glucose, Bld 03/01/2021 80  70 - 99 mg/dL Final   Glucose reference range applies only to samples taken after fasting for at least 8 hours.   BUN 03/01/2021 17  6 - 20 mg/dL Final   Creatinine 03/01/2021 0.88  0.44 - 1.00 mg/dL Final   Calcium 03/01/2021 9.3  8.9 - 10.3 mg/dL Final   Total Protein 03/01/2021 7.7  6.5 - 8.1 g/dL Final   Albumin 03/01/2021 3.8  3.5 - 5.0 g/dL Final   AST 03/01/2021 19  15 - 41 U/L Final   ALT 03/01/2021 17  0 - 44 U/L Final   Alkaline Phosphatase 03/01/2021 107  38 - 126 U/L Final   Total Bilirubin 03/01/2021 0.3  0.3 - 1.2 mg/dL Final   GFR, Estimated 03/01/2021 >60  >60 mL/min Final   Comment: (NOTE) Calculated using the CKD-EPI Creatinine Equation (2021)     Anion gap 03/01/2021 7  5 - 15 Final   Performed at [facility], [place] [address], [place], [place] [address]   WBC Count 03/01/2021 4.0  4.0 - 10.5 K/uL Final   RBC 03/01/2021 3.99  3.87 - 5.11 MIL/uL Final   Hemoglobin 03/01/2021 11.6 (A) 12.0 - 15.0 g/dL Final   HCT 03/01/2021 35.0 (A) 36.0 - 46.0 % Final   MCV 03/01/2021 87.7  80.0 - 100.0 fL Final   MCH 03/01/2021 29.1  26.0 - 34.0 pg Final   MCHC 03/01/2021 33.1  30.0 - 36.0 g/dL Final   RDW 03/01/2021 13.4  11.5 - 15.5 % Final   Platelet Count 03/01/2021 255  150 - 400 K/uL Final  nRBC 03/01/2021 0.0  0.0 - 0.2 % Final   Neutrophils Relative % 03/01/2021 54  % Final   Neutro Abs 03/01/2021 2.2  1.7 - 7.7 K/uL Final   Lymphocytes Relative 03/01/2021 32  % Final   Lymphs Abs 03/01/2021 1.3  0.7 - 4.0 K/uL Final   Monocytes Relative 03/01/2021 12  % Final   Monocytes Absolute 03/01/2021 0.5  0.1 - 1.0 K/uL Final   Eosinophils Relative 03/01/2021 1  % Final   Eosinophils Absolute 03/01/2021 0.1  0.0 - 0.5 K/uL Final   Basophils Relative 03/01/2021 1  % Final   Basophils Absolute 03/01/2021 0.1  0.0 - 0.1 K/uL Final   Immature Granulocytes 03/01/2021 0  % Final   Abs Immature Granulocytes 03/01/2021 0.00  0.00 - 0.07 K/uL Final   Performed at [facility], [place] [address], [place], [place] [address]   Bilirubin, Direct 03/01/2021 <0.1  0.0 - 0.2 mg/dL Final   Performed at [place] [address] [place], [place] [address]   Amylase 03/01/2021 71  28 - 100 U/L Final   Performed at [facility], [place] [address] [place], [place] [address]     STUDIES: No results found.   ELIGIBLE FOR AVAILABLE RESEARCH PROTOCOL: [name], ASA study   ASSESSMENT: 50 y.o. [place] woman status post left breast upper inner quadrant biopsy 01/14/2017 for a clinical T3 N2, stage IIA invasive ductal carcinoma, grade 1 or 2, estrogen and progesterone receptor  positive, HER-2 nonamplified, with an MIB-1 of 20%.  (1) staging studies: Brain MRI, bone scan, and CT scan of the chest 02/05/2017 showed no brain lesions, no lung or liver lesions, a 4.9 cm mass in the left breast with left axillary and subpectoral adenopathy, and nonspecific bone scan tracer at L2, left scapula, and anterior ribs, with lumbar spine MRI suggested for further evaluation.  (a) lumbar spine MRI 02/17/2017 showed no normal bone lesions.  There was mild lumbar spondylosis  (2) neoadjuvant chemotherapy consisting of cyclophosphamide and doxorubicin in dose dense fashion 4 started 02/10/2017, completed 03/24/2017, followed by weekly carboplatin and gemcitabine given days 1 and 8 of each 21-day cycle starting 04/14/2017, completing the planned 4 cycles 06/26/2017  (3) is post left modified radical mastectomy on 08/27/2017 showing an mpT3 pN2 residual invasive ductal carcinoma, grade 2, with a residual cancer burden of 3.  Margins were clear  (a) latissimus flap reconstruction with expander 11/03/2018  (4) postmastectomy radiation completed 01/01/2018  (a) capecitabine radiosensitization 11/16/2017-01/01/2018  (5) goserelin started 09/21/2017  (a) letrozole started 01/11/2018  (b) enrolled in [place] clinical trial, randomized to ribociclib on 05/31/2018  (c) Bone density on 03/16/2018 was normal with T score of -0.7 in the L1-L4 spine  (6) genetics testing 03/24/2017 through the Common Hereditary Cancer Panel offered by [facility] found no deleterious mutations in APC, ATM, AXIN2, BARD1, BMPR1A, BRCA1, BRCA2, BRIP1, CDH1, CDKN2A (p14ARF), CDKN2A (p16INK4a), CHEK2, CTNNA1, DICER1, EPCAM (Deletion/duplication testing only), GREM1 (promoter region deletion/duplication testing only), KIT, MEN1, MLH1, MSH2, MSH3, MSH6, MUTYH, NBN, NF1, NHTL1, PALB2, PDGFRA, PMS2, POLD1, POLE, PTEN, RAD50, RAD51C, RAD51D, SDHB, SDHC, SDHD, SMAD4, SMARCA4. STK11, TP53, TSC1, TSC2, and VHL.  The following genes were  evaluated for sequence changes only: SDHA and HOXB13 c.251G>A variant only.    (7) Right breast reduction and left breast expander placement on 03/03/2019 with latissimus flap placement  (a) left implant implant exchanged on 05/25/2019 or a [employer]or Smooth Round Ultra High Profile Gel 750cc.  Ref #[id number].  Serial Number [id number]   PLAN: Angelyna is doing quite well today.  She has no clinical or radiographic sign of breast cancer recurrence.  She continues on Letrozole and Ribociclib with good tolerance.  She has continued to forego the Goserelin.  We are following her Unity and estradiol closely.   Jabrea met with Dr. Jana Hakim, and he reviewed her anxiety and recommended she take benadryl BID.  She plans to do this.   I recommended continued healthy diet and exercise.    Karole has three more cycles of Ribocilib until she completes her therapy.  At that point she will continue on Letrozole.  We reviewed this in detail.  Dr. Jana Hakim discussed his upcoming retirement with her and she will return in 6 months for labs and f/u with Dr. Chryl Heck.  She knows to call for any questions that may arise between now and her next appointment.  We are happy to see her sooner if needed.   Total encounter time 25 minutes.[name], NP 03/04/21 8:45 AM Medical Oncology and Hematology [facility] [place], [place] [address] Tel. [phone]    Fax. [phone]   ADDENDUM: Nolon Bussing seen is 3-1/2 years out from definitive surgery for her breast cancer with no evidence of disease recurrence.  This is very favorable.  She is tolerating her current treatment quite well and the plan is to continue until she completes the study, after which she will go off the ribociclib.  Given her significant residual disease after neoadjuvant chemotherapy I would favor extending the aromatase inhibitor to a full 7 years.  I personally saw this patient and performed a substantive portion of  this encounter with the listed APP documented above.   Chauncey Cruel, MD Medical Oncology and Hematology [facility] [address] [place], [place] [address] Tel. [phone]    Fax. [phone]     *Total Encounter Time as defined by the Centers for Medicare and Medicaid Services includes, in addition to the face-to-face time of a patient visit (documented in the note above) non-face-to-face time: obtaining and reviewing outside history, ordering and reviewing medications, tests or procedures, care coordination (communications with other health care professionals or caregivers) and documentation in the medical record.

## 2021-03-04 NOTE — Research (Addendum)
[name] Cycle 37 Day 1  03/04/21 9:45am  [name] arrived today unaccompanied for her scheduled Cycle 37 visit on the [name] protocol.  Labs: Lab work was drawn, per protocol on 03/01/21. Patient confirms she was fasting as directed. [place] and Estradiol were drawn but estradiol is not available for review yet. All other lab results reviewed by Dr. Jana Hakim.  Dr. Jana Hakim states slightly low sodium 134 and Hgb 11.6 are not considered clinically significant.  Corrected Calcium Formula:  4 g/dL utilized for "normal albumin;" local normal range is 3.5-5 g/dL. Corrected Calcium = (0.8 x [normal albumin - patient's albumin]) + serum calcium Correct Calcium = (0.8 x[4-3.8]) + 9.3 Corrected Calcium = 9.46 g.dL  PROs: Provided to patient prior to provider visit and collected and checked for completeness and accuracy.   Vital Signs: Patient was seated for five minutes prior to measurement of vital signs. See VS Flowsheet.   Healthcare Resource Utilization: Patient has not been admitted to any healthcare facility or had any visits in our clinic since her last visit.  Concomitant Medications: Current medication list reviewed with patient. Patient reports she has not taken Symbicort since March and also hasn't used Isopto tears eye drops since the beginning of this year. Patient reports she took Doxycycline for cellulitis x 7 days from 02/24/21 through 03/02/21.  Medication list updated. Instructed patient to check with research nurse prior to taking any more antibiotics or new medications while on study. Provided short list of prohibited antibiotics for her reference. Patient verbalized understanding.   Adverse Events:  Patient reports mild pain right calf and right thigh resolved. She did not notice it anymore after her last visit with Dr. Jana Hakim.  She reports ongoing mid-sternal chest pains intermittently. This has not been relieved with omeprazole. Patient thinks the chest pains are related  to anxiety. She reports feeling a moderate amount of anxiety in the past few weeks due to her husband's health problems. He has been hospitalized for pulmonary and cardiac problems and it has been very stressful for patient. Patient also reports she has cellulitis on right lower leg which started after a camping trip where she fell in some water and cut her leg on a rock. She was treated with doxycycline last week but continues to have some redness in the area. She is going to contact her PCP again to follow up and see if she needs any further treatment at this time.  Patient confirmed other previously noted AEs are ongoing, without change.  These are listed in AE table below as ongoing.      Physical Exam and Performance Status:  Patient examined today by [name], NP.  Please see her office visit encounter for related notes.  Patient does not require any dose modification per protocol, will continue ribociclib at 400 mg. Dr. Jana Hakim is in agreement. He instructed okay to start this cycle of ribociclib and to continue to hold goserelin. Patient denies any menstrual bleeding and understands to notify Dr. Jana Hakim and/or research nurse if she starts to have any vaginal bleeding. Estradiol level is not available for review today. Patient understands depending on results, she may need to restart Goserelin injections but will continue to hold until results are available.    Investigational Medication:  Patient returned her completed medication diaries and bottles for cycles 34-36. Patient returned the following kits, drug count confirmed by Cornell Barman, PharmD: Cycle 34 Kit [phone], 11 pills remaining indicating 4 missed doses ribociclib (2 doses were  interrupted due to holiday scheduling issue at the beginning of the cycle and 2 doses were forgotten by patient). Patient diary for this cycle concurs with these interrupted/missed doses. 2 doses of Letrozole were also missed/forgotten along with the  ribociclib.  Cycle 35 Kit [id number], 5 pills remaining indicating 1 missed dose of ribociclib. Patient diary reflects one missed dose of both ribociclib and letrozole. Patient states she forgot. Cycle 36 Kit [id number], 3 pills remaining indicating no missed doses this cycle.  Patients diary reflects 100% compliance with both ribociclib and letrozole this cycle.  Visit was logged in the IRT system and the following kits were dispensed to the patient: [id number], [id number] and [id number] for cycles 37-39 Patient verbalized understanding of how to take medications.  Patient was also provided with drug diaries for cycles 37-39. Patient took prescribed dose of ribociclib and letrozole in clinic at 10:05 am 03/04/21 and recorded doses in medication diary.   Visit Scheduling - Patient will return to clinic in 3 months on 05/27/21 to meet with research nurse to return her medications and diaries. Research will also dispense 3 more months of diaries at that time for patient to continue recording letrozole doses. Next provider visit is due in 6 months on 08/19/21. Patient aware that she should contact the clinic at any time if she has any questions or concerns prior to her next visit.  Patient thanked for her ongoing participation in the [place] trial.  AE Table: Event Grade Onset Date End Date Status Comments Attribution to Ribociclib Attribution to Letrozole Attribution to Goserelin  Allergic Rhinitis 2 2012   ongoing At Baseline n/a n/a n/a  Constipation 1 08/27/17   ongoing At Baseline n/a n/a n/a  Cough, intermittent 2 05/09/18   ongoing At Baseline n/a n/a n/a  GERD, intermittent 1 2018   ongoing At Baseline n/a n/a n/a  Gluten Sensitivity 1 12/2017   ongoing At Baseline n/a n/a n/a  Headaches 1 02/2017   ongoing At Baseline n/a n/a n/a  Hot Flashes 1 04/02/20   ongoing          Hyperglycemia 1 2018   ongoing At Baseline n/a n/a n/a  Hyperlipidemia 1 03/2016   ongoing At Baseline n/a n/a n/a  Vitamin D Deficiency 2  05/2015   ongoing At Baseline n/a n/a n/a  Arthralgias bilat legs 1 10/01/18   ongoing At Baseline        Bilateral foot pain/ Pes Planus 2     ongoing At Baseline, recently needed new orthotics        Fatigue 1 06/28/18   ongoing   Yes No No  Hypertension  1-3 2012   ongoing At Baseline. Intermittent grade 1 - 3 recorded since 2012. Changed to History at Baseline.         Hepatic Cysts 1 04/04/19   ongoing [name] 04/04/19 & MRI 07/06/19   No No No  Cholelithiasis 1 04/04/19   ongoing [name] 04/04/19 & MRI 07/06/19 No No No  Neuropathy bilateral feet 1 10/08/19   ongoing Resolved and then recurred. Gabapentin Yes No No  Dyspnea on exertion 1 08/09/19   ongoing  going upstairs No No No  Lymphedema bilateral arms 1 07/25/19   ongoing PHT and Sleeves No No No  Bilateral Knee Pain 2 07/11/19   ongoing Intermittent. PHT and steroid injections No No No  Nail Infection (fungus on toenails) 1 10/08/19   ongoing   No No No  Vaginal Dryness 1 10/17/19  ongoing   No Yes Yes  Pre-Diabetes 2 07/26/18  ongoing Elevated Hgb A1C, started metformin No  No No  GERD 2 12/10/20  ongoing Grade 1 at baseline No No No  Pain, Right Calf 1 12/10/20 12/11/20 resolved  No No No  Pain, Right Thigh 1 12/10/20 12/11/20 resolved  No No No  Anxiety 2 02/16/2021  ongoing  No No No  Cellulitis 2 01/27/2021  ongoing Doxycycline No No No    Foye Spurling, BSN, [employer]

## 2021-03-05 ENCOUNTER — Encounter: Payer: Self-pay | Admitting: Oncology

## 2021-03-05 MED ORDER — INV-RIBOCICLIB 200 MG TAB NATALEE TRIO003 STUDY: 400.0000 mg | ORAL_TABLET | Freq: Every day | ORAL | 0 refills | Status: DC

## 2021-03-08 ENCOUNTER — Encounter: Payer: Self-pay | Admitting: Oncology

## 2021-03-11 ENCOUNTER — Other Ambulatory Visit: Payer: Self-pay | Admitting: Oncology

## 2021-03-11 ENCOUNTER — Other Ambulatory Visit (HOSPITAL_COMMUNITY): Payer: Self-pay

## 2021-03-11 DIAGNOSIS — Z170 Estrogen receptor positive status [ER+]: Secondary | ICD-10-CM

## 2021-03-11 DIAGNOSIS — C50812 Malignant neoplasm of overlapping sites of left female breast: Secondary | ICD-10-CM

## 2021-03-11 MED ORDER — LETROZOLE 2.5 MG PO TABS
2.5000 mg | ORAL_TABLET | Freq: Every day | ORAL | 4 refills | Status: DC
  Filled 2021-03-11 – 2021-03-20 (×2): qty 90, 90d supply, fill #0
  Filled 2021-07-16: qty 90, 90d supply, fill #1
  Filled 2021-10-22: qty 90, 90d supply, fill #2
  Filled 2022-01-27: qty 90, 90d supply, fill #3

## 2021-03-13 ENCOUNTER — Other Ambulatory Visit: Payer: Self-pay

## 2021-03-13 ENCOUNTER — Ambulatory Visit (INDEPENDENT_AMBULATORY_CARE_PROVIDER_SITE_OTHER): Payer: 59 | Admitting: Adult Health

## 2021-03-13 ENCOUNTER — Encounter (INDEPENDENT_AMBULATORY_CARE_PROVIDER_SITE_OTHER): Payer: Self-pay | Admitting: Adult Health

## 2021-03-13 VITALS — BP 119/83 | HR 86 | Temp 98.0°F | Ht 65.0 in | Wt 248.0 lb

## 2021-03-13 DIAGNOSIS — R7303 Prediabetes: Secondary | ICD-10-CM

## 2021-03-13 DIAGNOSIS — E559 Vitamin D deficiency, unspecified: Secondary | ICD-10-CM | POA: Diagnosis not present

## 2021-03-13 DIAGNOSIS — Z6841 Body Mass Index (BMI) 40.0 and over, adult: Secondary | ICD-10-CM | POA: Diagnosis not present

## 2021-03-13 LAB — ESTRADIOL, ULTRA SENS: Estradiol, Sensitive: <2.5 pg/mL

## 2021-03-13 NOTE — Progress Notes (Addendum)
Chief Complaint:   OBESITY Mackenzie [name] is here to discuss her progress with her obesity treatment plan along with follow-up of her obesity related diagnoses. Mackenzie [name] is on the Category 3 Plan and states she is following her eating plan approximately 80% of the time. Mackenzie [name] states she is doing Zumba and weights for 30 minutes 5 times per week.  Today's visit was #: 86 Starting weight: 267 lbs Starting date: 08/09/2019 Today's weight: 248 lbs Today's date: 03/13/2021 Total lbs lost to date: 19 lbs Total lbs lost since last in-office visit: 0  Interim History: Mackenzie [name]'s husband was re-admitted - LifeVest was discontinued, PPM inserted.  He is stable and has returned to work- Chief Technology Officer. She has stopped the Ergocalciferol as instructed via MyChart message- now on OTC supplement.  Subjective:   1. Vitamin D deficiency Discussed labs with patient today.  Vitamin D level on 02/27/2021 - 83 - at goal. Ergocalciferol was discontinued.  Converted to OTC vitamin D3 1,000 IU daily.  Lab Results  Component Value Date   VD25OH 83.0 02/27/2021   VD25OH 74.7 11/15/2020   VD25OH 73.12 06/27/2020   2. Pre-diabetes Discussed labs with patient today.  On 02/27/2021, BG 80, A1c 5.4, insulin 15.1. She denies polyphagia. She is on metformin 500 mg BID.   She is on Ozempic 2mg . On 02/27/2021, CMP - GFR 65.  Assessment/Plan:   1. Vitamin D deficiency Continue OTC vitamin D3 1,000 IU daily.  2. Pre-diabetes Continue metformin as directed.  3. Obesity: Current BMI 41.3  Mackenzie [name] is currently in the action stage of change. As such, her goal is to continue with weight loss efforts. She has agreed to the Category 3 Plan.   Provided calorie/protein ranges for each meal.  Exercise goals:  As is.  Behavioral modification strategies: increasing lean protein intake, decreasing simple carbohydrates, no skipping meals, meal planning and cooking strategies, keeping healthy foods in the home, and planning  for success.  Mackenzie [name] has agreed to follow-up with our clinic in 2 weeks. She was informed of the importance of frequent follow-up visits to maximize her success with intensive lifestyle modifications for her multiple health conditions.   Objective:   Blood pressure 119/83, pulse 86, temperature 98 F (36.7 C), height 5\' 5"  (1.651 m), weight 248 lb (112.5 kg), last menstrual period 02/10/2017, SpO2 99 %, unknown if currently breastfeeding. Body mass index is 41.27 kg/m.  General: Cooperative, alert, well developed, in no acute distress. HEENT: Conjunctivae and lids unremarkable. Cardiovascular: Regular rhythm.  Lungs: Normal work of breathing. Neurologic: No focal deficits.   Lab Results  Component Value Date   CREATININE 0.88 03/01/2021   BUN 17 03/01/2021   NA 134 (L) 03/01/2021   K 3.9 03/01/2021   CL 103 03/01/2021   CO2 24 03/01/2021   Lab Results  Component Value Date   ALT 17 03/01/2021   AST 19 03/01/2021   GGT 19 03/01/2021   ALKPHOS 107 03/01/2021   BILITOT 0.3 03/01/2021   Lab Results  Component Value Date   HGBA1C 5.4 02/27/2021   HGBA1C 5.4 11/15/2020   HGBA1C 5.1 06/27/2020   HGBA1C 5.6 01/09/2020   HGBA1C 5.9 (H) 08/09/2019   Lab Results  Component Value Date   INSULIN 15.1 02/27/2021   INSULIN 11.7 11/15/2020   INSULIN 13.3 01/11/2020   INSULIN 12.6 08/09/2019   Lab Results  Component Value Date   TSH 0.657 08/09/2019   Lab Results  Component Value Date   CHOL 202 (H)  11/15/2020   HDL 58 11/15/2020   LDLCALC 135 (H) 11/15/2020   TRIG 52 11/15/2020   CHOLHDL 3.5 11/15/2020   Lab Results  Component Value Date   VD25OH 83.0 02/27/2021   VD25OH 74.7 11/15/2020   VD25OH 73.12 06/27/2020   Lab Results  Component Value Date   WBC 4.0 03/01/2021   HGB 11.6 (L) 03/01/2021   HCT 35.0 (L) 03/01/2021   MCV 87.7 03/01/2021   PLT 255 03/01/2021   Attestation Statements:   Reviewed by clinician on day of visit: allergies, medications,  problem list, medical history, surgical history, family history, social history, and previous encounter notes.  Time spent on visit including pre-visit chart review and post-visit care and charting was 30 minutes.   I, Water quality scientist, CMA, am acting as Location manager for [name], NP.  I have reviewed the above documentation for accuracy and completeness, and I agree with the above. -Desarea Ohagan d. Tulip Meharg, NP-C

## 2021-03-14 ENCOUNTER — Encounter: Payer: Self-pay | Admitting: Oncology

## 2021-03-19 ENCOUNTER — Other Ambulatory Visit (HOSPITAL_COMMUNITY): Payer: Self-pay

## 2021-03-20 ENCOUNTER — Ambulatory Visit
Admission: RE | Admit: 2021-03-20 | Discharge: 2021-03-20 | Disposition: A | Discharge status: Home / Self Care | Payer: 59 | Source: Ambulatory Visit | Attending: Oncology | Admitting: Oncology

## 2021-03-20 ENCOUNTER — Other Ambulatory Visit (HOSPITAL_COMMUNITY): Payer: Self-pay

## 2021-03-20 ENCOUNTER — Other Ambulatory Visit: Payer: Self-pay

## 2021-03-20 DIAGNOSIS — Z780 Asymptomatic menopausal state: Secondary | ICD-10-CM | POA: Diagnosis not present

## 2021-03-20 DIAGNOSIS — C50812 Malignant neoplasm of overlapping sites of left female breast: Secondary | ICD-10-CM

## 2021-03-20 DIAGNOSIS — E559 Vitamin D deficiency, unspecified: Secondary | ICD-10-CM

## 2021-03-20 DIAGNOSIS — Z170 Estrogen receptor positive status [ER+]: Secondary | ICD-10-CM

## 2021-03-20 DIAGNOSIS — M8588 Other specified disorders of bone density and structure, other site: Secondary | ICD-10-CM | POA: Diagnosis not present

## 2021-03-21 ENCOUNTER — Other Ambulatory Visit (HOSPITAL_COMMUNITY): Payer: Self-pay

## 2021-04-02 ENCOUNTER — Encounter (INDEPENDENT_AMBULATORY_CARE_PROVIDER_SITE_OTHER): Payer: Self-pay

## 2021-04-03 ENCOUNTER — Encounter (INDEPENDENT_AMBULATORY_CARE_PROVIDER_SITE_OTHER): Payer: Self-pay | Admitting: Family Medicine

## 2021-04-03 ENCOUNTER — Other Ambulatory Visit: Payer: Self-pay

## 2021-04-03 ENCOUNTER — Ambulatory Visit (INDEPENDENT_AMBULATORY_CARE_PROVIDER_SITE_OTHER): Payer: 59 | Admitting: Physician Assistant

## 2021-04-03 ENCOUNTER — Ambulatory Visit (INDEPENDENT_AMBULATORY_CARE_PROVIDER_SITE_OTHER): Payer: 59 | Admitting: Family Medicine

## 2021-04-03 VITALS — BP 111/72 | HR 98 | Temp 98.1°F | Ht 65.0 in | Wt 243.0 lb

## 2021-04-03 DIAGNOSIS — R7303 Prediabetes: Secondary | ICD-10-CM

## 2021-04-03 DIAGNOSIS — Z6841 Body Mass Index (BMI) 40.0 and over, adult: Secondary | ICD-10-CM | POA: Diagnosis not present

## 2021-04-04 NOTE — Progress Notes (Signed)
Chief Complaint:   OBESITY Caidence is here to discuss her progress with her obesity treatment plan along with follow-up of her obesity related diagnoses. Makena is on the Category 3 Plan and states she is following her eating plan approximately 75% of the time. Loyda states she is at the gym for 30-45 minutes 6 times per week.  Today's visit was #: 49 Starting weight: 267 lbs Starting date: 08/09/2019 Today's weight: 243 lbs Today's date: 04/03/2021 Total lbs lost to date: 24 Total lbs lost since last in-office visit: 5  Interim History: Umi continues to do well with weight loss. She is working on exercising regularly with cardio and strengthening. She has some mid morning hunger when she eats and earlier breakfast.  Subjective:   1. Pre-diabetes Annaka is doing well on Ozempic, and she notes decreased polyphagia. She denies nausea, vomiting, or hypoglycemia.  Assessment/Plan:   1. Pre-diabetes Brynleigh will continue Ozempic and will continue to monitor. She will continue with diet, exercise, and decreasing simple carbohydrates to help decrease the risk of diabetes. We will plan to recheck labs in 1-2 months.  2. Obesity: Current BMI 40.5 Myonna is currently in the action stage of change. As such, her goal is to continue with weight loss efforts. She has agreed to the Category 3 Plan.   Exercise goals: As is.  Behavioral modification strategies: increasing lean protein intake, no skipping meals, and better snacking choices.  Leialoha has agreed to follow-up with our clinic in 2 to 3 weeks. She was informed of the importance of frequent follow-up visits to maximize her success with intensive lifestyle modifications for her multiple health conditions.   Objective:   Blood pressure 111/72, pulse 98, temperature 98.1 F (36.7 C), height 5\' 5"  (1.651 m), weight 243 lb (110.2 kg), last menstrual period 02/10/2017, SpO2 97 %, unknown if currently breastfeeding. Body mass  index is 40.44 kg/m.  General: Cooperative, alert, well developed, in no acute distress. HEENT: Conjunctivae and lids unremarkable. Cardiovascular: Regular rhythm.  Lungs: Normal work of breathing. Neurologic: No focal deficits.   Lab Results  Component Value Date   CREATININE 0.88 03/01/2021   BUN 17 03/01/2021   NA 134 (L) 03/01/2021   K 3.9 03/01/2021   CL 103 03/01/2021   CO2 24 03/01/2021   Lab Results  Component Value Date   ALT 17 03/01/2021   AST 19 03/01/2021   GGT 19 03/01/2021   ALKPHOS 107 03/01/2021   BILITOT 0.3 03/01/2021   Lab Results  Component Value Date   HGBA1C 5.4 02/27/2021   HGBA1C 5.4 11/15/2020   HGBA1C 5.1 06/27/2020   HGBA1C 5.6 01/09/2020   HGBA1C 5.9 (H) 08/09/2019   Lab Results  Component Value Date   INSULIN 15.1 02/27/2021   INSULIN 11.7 11/15/2020   INSULIN 13.3 01/11/2020   INSULIN 12.6 08/09/2019   Lab Results  Component Value Date   TSH 0.657 08/09/2019   Lab Results  Component Value Date   CHOL 202 (H) 11/15/2020   HDL 58 11/15/2020   LDLCALC 135 (H) 11/15/2020   TRIG 52 11/15/2020   CHOLHDL 3.5 11/15/2020   Lab Results  Component Value Date   VD25OH 83.0 02/27/2021   VD25OH 74.7 11/15/2020   VD25OH 73.12 06/27/2020   Lab Results  Component Value Date   WBC 4.0 03/01/2021   HGB 11.6 (L) 03/01/2021   HCT 35.0 (L) 03/01/2021   MCV 87.7 03/01/2021   PLT 255 03/01/2021   No results  found for: IRON, TIBC, FERRITIN  Attestation Statements:   Reviewed by clinician on day of visit: allergies, medications, problem list, medical history, surgical history, family history, social history, and previous encounter notes.  Time spent on visit including pre-visit chart review and post-visit care and charting was 30 minutes.    I, Trixie Dredge, am acting as transcriptionist for Dennard Nip, MD.  I have reviewed the above documentation for accuracy and completeness, and I agree with the above. -  Dennard Nip,  MD

## 2021-04-05 ENCOUNTER — Ambulatory Visit: Payer: Self-pay

## 2021-04-05 ENCOUNTER — Ambulatory Visit: Payer: 59 | Admitting: Orthopaedic Surgery

## 2021-04-05 ENCOUNTER — Other Ambulatory Visit (HOSPITAL_COMMUNITY): Payer: Self-pay

## 2021-04-05 ENCOUNTER — Telehealth: Payer: Self-pay

## 2021-04-05 ENCOUNTER — Encounter: Payer: Self-pay | Admitting: Orthopaedic Surgery

## 2021-04-05 ENCOUNTER — Other Ambulatory Visit: Payer: Self-pay

## 2021-04-05 DIAGNOSIS — M1712 Unilateral primary osteoarthritis, left knee: Secondary | ICD-10-CM | POA: Diagnosis not present

## 2021-04-05 DIAGNOSIS — M1711 Unilateral primary osteoarthritis, right knee: Secondary | ICD-10-CM | POA: Diagnosis not present

## 2021-04-05 MED ORDER — CELECOXIB 200 MG PO CAPS
200.0000 mg | ORAL_CAPSULE | Freq: Two times a day (BID) | ORAL | 3 refills | Status: DC
  Filled 2021-04-05: qty 30, 15d supply, fill #0
  Filled 2021-07-30: qty 30, 15d supply, fill #1
  Filled 2021-11-18: qty 30, 15d supply, fill #2
  Filled 2021-12-11: qty 30, 15d supply, fill #3

## 2021-04-05 MED ORDER — DICLOFENAC SODIUM 2 % EX SOLN: 2.0000 g | Freq: Two times a day (BID) | CUTANEOUS | 3 refills | Status: DC | PRN

## 2021-04-05 NOTE — Telephone Encounter (Signed)
Please submit for bil gel inj's

## 2021-04-05 NOTE — Progress Notes (Signed)
Office Visit Note   Patient: Mackenzie [name]           Date of Birth: [birth year]           MRN: [id number] Visit Date: 04/05/2021              Requested by: [name], MD [address] [place],  [place] [address] PCP: [name], MD   Assessment & Plan: Visit Diagnoses:  1. Primary osteoarthritis of right knee   2. Primary osteoarthritis of left knee     Plan: Impression is bilateral knee osteoarthritis.  Joint spaces are well-preserved.  We had a discussion on conservative management to include physical therapy, aquatic exercises, weight loss, oral and topical NSAIDs and Visco injections.  She is interested in trying all of these.  Cortisone injections in the past have not helped.  This patient is diagnosed with osteoarthritis of the knee(s).    Radiographs show evidence of joint space narrowing, osteophytes, subchondral sclerosis and/or subchondral cysts.  This patient has knee pain which interferes with functional and activities of daily living.    This patient has experienced inadequate response, adverse effects and/or intolerance with conservative treatments such as acetaminophen, NSAIDS, topical creams, physical therapy or regular exercise, knee bracing and/or weight loss.   This patient has experienced inadequate response or has a contraindication to intra articular steroid injections for at least 3 months.   This patient is not scheduled to have a total knee replacement within 6 months of starting treatment with viscosupplementation.  The patient meets the AMA guidelines for Morbid (severe) obesity with a BMI > 40.0 and I have recommended weight loss.  Follow-Up Instructions: No follow-ups on file.   Orders:  Orders Placed This Encounter  Procedures   XR KNEE 3 VIEW LEFT   XR KNEE 3 VIEW RIGHT   Ambulatory referral to Physical Therapy   Meds ordered this encounter  Medications   Diclofenac Sodium (PENNSAID) 2 % SOLN    Sig: Apply 2 g  topically 2 (two) times daily as needed (to affected area).    Dispense:  112 g    Refill:  3   celecoxib (CELEBREX) 200 MG capsule    Sig: Take 1 capsule (200 mg total) by mouth 2 (two) times daily.    Dispense:  30 capsule    Refill:  3      Procedures: No procedures performed   Clinical Data: No additional findings.   Subjective: Chief Complaint  Patient presents with   Right Knee - Pain   Left Knee - Pain    Mackenzie [name] is a very pleasant 50 year old female who works on [address] at [employer] in today for bilateral knee pain with cracking and popping with range of motion.  She is had prior cortisone injections without significant relief.  She currently takes Tylenol extra strength for the pain.  Denies catching locking gout or swelling.  The pain is more intense on the medial side of the knees.   Review of Systems  Constitutional: Negative.   HENT: Negative.    Eyes: Negative.   Respiratory: Negative.    Cardiovascular: Negative.   Endocrine: Negative.   Musculoskeletal: Negative.   Neurological: Negative.   Hematological: Negative.   Psychiatric/Behavioral: Negative.    All other systems reviewed and are negative.   Objective: Vital Signs: LMP 02/10/2017   Physical Exam Vitals and nursing note reviewed.  Constitutional:  Appearance: She is well-developed.  HENT:     Head: Normocephalic and atraumatic.  Pulmonary:     Effort: Pulmonary effort is normal.  Abdominal:     Palpations: Abdomen is soft.  Musculoskeletal:     Cervical back: Neck supple.  Skin:    General: Skin is warm.     Capillary Refill: Capillary refill takes less than 2 seconds.  Neurological:     Mental Status: She is alert and oriented to person, place, and time.  Psychiatric:        Behavior: Behavior normal.        Thought Content: Thought content normal.        Judgment: Judgment normal.    Ortho Exam  Bilateral knees show mild crepitus with range of  motion.  Collaterals and cruciates are stable.  Slightly medial tenderness.  Negative [place].  Specialty Comments:  No specialty comments available.  Imaging: XR KNEE 3 VIEW LEFT  Result Date: 04/05/2021 Mild osteoarthritis.  XR KNEE 3 VIEW RIGHT  Result Date: 04/05/2021 Mild osteoarthritis.    PMFS History: Patient Active Problem List   Diagnosis Date Noted   Polyphagia 05/16/2020   Vitamin D deficiency 05/16/2020   At risk for osteoporosis 05/16/2020   Prediabetes 05/01/2020   Elevated blood pressure reading 05/01/2020   At risk for impaired metabolic function 63/87/5643   Breast asymmetry following reconstructive surgery 09/20/2019   Benign essential HTN 08/17/2018   Class 3 severe obesity with serious comorbidity and body mass index (BMI) of 40.0 to 44.9 in adult [facility]) 09/17/2017   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   Malignant neoplasm of upper-inner quadrant of left breast in female, estrogen receptor positive ([place]) 07/27/2017   Genetic testing 03/25/2017   Malignant 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, estrogen receptor positive ([place]) 01/23/2017   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, left ([place]) 01/23/2017   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   Allergic rhinitis 2012   Anemia 01/27/2017   prior to starting chemotherapy   Breast cancer ([place]) 01/14/2017   Left breast   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 Did not have treatment.   Other Mother        P.29 from complications of surgery to remove brain tumor   Diabetes Mother    Hypertension Mother    Stroke Mother    Kidney disease Father    Hypertension Other    Diabetes Other    Stroke Other     Past Surgical History:  Procedure Laterality Date   BREAST RECONSTRUCTION WITH PLACEMENT OF TISSUE EXPANDER AND FLEX HD (ACELLULAR HYDRATED DERMIS) Left 08/27/2017   Procedure: LEFT BREAST RECONSTRUCTION WITH PLACEMENT OF TISSUE EXPANDER AND FLEX HD;  Surgeon: Wallace Going, DO;  Location:  [facility];  Service: Plastics;  Laterality: Left;   CESAREAN SECTION     x2   LATISSIMUS FLAP TO BREAST Left 11/03/2018   LATISSIMUS FLAP TO BREAST Left 11/03/2018   Procedure: LATISSIMUS FLAP TO LEFT BREAST;  Surgeon: Wallace Going, DO;  Location: [facility];  Service: Plastics;  Laterality: Left;   MASTECTOMY MODIFIED RADICAL Left 08/27/2017   MASTECTOMY MODIFIED RADICAL Left 08/27/2017   Procedure: LEFT MODIFIED RADICAL MASTECTOMY;  Surgeon: Erroll Luna, MD;  Location: [facility];  Service: General;  Laterality: Left;   MASTOPEXY Right 03/03/2019   Procedure: RIGHT BREAST MASTOPEXY/REDUCTION;  Surgeon: Wallace Going, DO;  Location: [facility];  Service: Plastics;  Laterality: Right;  3 hours, please   PORT-A-CATH REMOVAL Right 08/27/2017   Procedure: REMOVAL PORT-A-CATH RIGHT CHEST;  Surgeon: Erroll Luna, MD;  Location: [facility];  Service: General;  Laterality: Right;   PORTACATH PLACEMENT Right 01/28/2017   Procedure: INSERTION PORT-A-CATH WITH ULTRASOUND;  Surgeon: Erroll Luna, MD;  Location: [facility];  Service: General;  Laterality: Right;   REDUCTION MAMMAPLASTY Right    REMOVAL OF TISSUE EXPANDER AND PLACEMENT OF IMPLANT Left 03/03/2019   Procedure: REMOVAL OF TISSUE EXPANDER AND  PLACEMENT OF EXPANDER;  Surgeon: Wallace Going, DO;  Location: [facility];  Service: Plastics;  Laterality: Left;   REMOVAL OF TISSUE EXPANDER AND PLACEMENT OF IMPLANT Left 05/25/2019   Procedure: LEFT BREAST REMOVAL OF TISSUE EXPANDER AND PLACEMENT OF IMPLANT;  Surgeon: Wallace Going, DO;  Location: [facility];  Service: Plastics;  Laterality: Left;   TISSUE EXPANDER PLACEMENT Left 11/03/2018   Procedure: PLACEMENT OF TISSUE EXPANDER LEFT BREAST;  Surgeon: Wallace Going, DO;  Location: [facility];  Service: Plastics;  Laterality: Left;  Total case time is 3.5 hours   TUBAL LIGATION Bilateral 01/21/2011   Social History   Occupational History   Occupation: Therapist, sports  Tobacco Use   Smoking status: Never   Smokeless tobacco: Never  Vaping Use   Vaping Use: Never used  Substance and Sexual Activity   Alcohol use: Yes    Comment: social   Drug use: No   Sexual activity: Not on file    Comment: BTL

## 2021-04-07 ENCOUNTER — Encounter (HOSPITAL_BASED_OUTPATIENT_CLINIC_OR_DEPARTMENT_OTHER): Payer: Self-pay

## 2021-04-07 ENCOUNTER — Emergency Department (HOSPITAL_BASED_OUTPATIENT_CLINIC_OR_DEPARTMENT_OTHER)
Admission: EM | Admit: 2021-04-07 | Discharge: 2021-04-08 | Disposition: A | Discharge status: Home / Self Care | Payer: 59 | Attending: Emergency Medicine | Admitting: Emergency Medicine

## 2021-04-07 ENCOUNTER — Emergency Department (HOSPITAL_BASED_OUTPATIENT_CLINIC_OR_DEPARTMENT_OTHER): Payer: 59

## 2021-04-07 ENCOUNTER — Other Ambulatory Visit: Payer: Self-pay

## 2021-04-07 DIAGNOSIS — Z7984 Long term (current) use of oral hypoglycemic drugs: Secondary | ICD-10-CM | POA: Insufficient documentation

## 2021-04-07 DIAGNOSIS — I10 Essential (primary) hypertension: Secondary | ICD-10-CM | POA: Diagnosis not present

## 2021-04-07 DIAGNOSIS — R079 Chest pain, unspecified: Secondary | ICD-10-CM | POA: Diagnosis not present

## 2021-04-07 DIAGNOSIS — R0602 Shortness of breath: Secondary | ICD-10-CM | POA: Diagnosis not present

## 2021-04-07 DIAGNOSIS — R7989 Other specified abnormal findings of blood chemistry: Secondary | ICD-10-CM | POA: Diagnosis not present

## 2021-04-07 DIAGNOSIS — R0789 Other chest pain: Secondary | ICD-10-CM | POA: Diagnosis not present

## 2021-04-07 DIAGNOSIS — K449 Diaphragmatic hernia without obstruction or gangrene: Secondary | ICD-10-CM | POA: Diagnosis not present

## 2021-04-07 DIAGNOSIS — Z853 Personal history of malignant neoplasm of breast: Secondary | ICD-10-CM | POA: Diagnosis not present

## 2021-04-07 LAB — CBC
HCT: 35.5 % — ABNORMAL LOW (ref 36.0–46.0)
Hemoglobin: 11.6 g/dL — ABNORMAL LOW (ref 12.0–15.0)
MCH: 28.6 pg (ref 26.0–34.0)
MCHC: 32.7 g/dL (ref 30.0–36.0)
MCV: 87.4 fL (ref 80.0–100.0)
Platelets: 237 10*3/uL (ref 150–400)
RBC: 4.06 MIL/uL (ref 3.87–5.11)
RDW: 13.7 % (ref 11.5–15.5)
WBC: 4.9 10*3/uL (ref 4.0–10.5)
nRBC: 0 % (ref 0.0–0.2)

## 2021-04-07 LAB — BASIC METABOLIC PANEL
Anion gap: 7 (ref 5–15)
BUN: 25 mg/dL — ABNORMAL HIGH (ref 6–20)
CO2: 27 mmol/L (ref 22–32)
Calcium: 9.3 mg/dL (ref 8.9–10.3)
Chloride: 103 mmol/L (ref 98–111)
Creatinine, Ser: 0.88 mg/dL (ref 0.44–1.00)
GFR, Estimated: >60 mL/min (ref >60)
Glucose, Bld: 100 mg/dL — ABNORMAL HIGH (ref 70–99)
Potassium: 3.7 mmol/L (ref 3.5–5.1)
Sodium: 137 mmol/L (ref 135–145)

## 2021-04-07 LAB — TROPONIN I (HIGH SENSITIVITY): Troponin I (High Sensitivity): 3 ng/L (ref <18)

## 2021-04-07 LAB — D-DIMER, QUANTITATIVE: D-Dimer, Quant: 0.76 ug/mL-FEU — ABNORMAL HIGH (ref 0.00–0.50)

## 2021-04-07 MED ORDER — SODIUM CHLORIDE 0.9 % IV BOLUS
1000.0000 mL | Freq: Once | INTRAVENOUS | Status: AC
  Administered 2021-04-08: 1000 mL via INTRAVENOUS

## 2021-04-07 MED ORDER — MORPHINE SULFATE (PF) 4 MG/ML IV SOLN
4.0000 mg | Freq: Once | INTRAVENOUS | Status: AC
  Administered 2021-04-08: 4 mg via INTRAVENOUS
  Filled 2021-04-07: qty 1

## 2021-04-07 MED ORDER — IOHEXOL 350 MG/ML SOLN
80.0000 mL | Freq: Once | INTRAVENOUS | Status: AC | PRN
  Administered 2021-04-07: 80 mL via INTRAVENOUS

## 2021-04-07 MED ORDER — ASPIRIN 325 MG PO TABS: 325.0000 mg | ORAL_TABLET | Freq: Every day | ORAL | Status: DC

## 2021-04-07 MED ORDER — ONDANSETRON HCL 4 MG/2ML IJ SOLN
4.0000 mg | Freq: Once | INTRAMUSCULAR | Status: AC
  Administered 2021-04-08: 4 mg via INTRAVENOUS
  Filled 2021-04-07: qty 2

## 2021-04-07 NOTE — ED Provider Notes (Signed)
[facility] EMERGENCY DEPT Provider Note   CSN: [id number] Arrival date & time: 04/07/21  2138     History Chief Complaint  Patient presents with   Chest Pain    Mackenzie [name] is a 50 y.o. female.  Patient is to ER chief complaint of mid chest pain describes a sharp aching pressure-like sensation that radiates to her back.  It is worse when she takes a deep breath.  Has been persistently there since this morning. Cardiac history or chest pain history per patient.  Denies fevers denies cough denies vomiting.  Complaining of some shortness of breath with her chest pain.  No diaphoresis or palpitations reported.      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, left ([place]) 01/23/2017   Edema, lower extremity    Hot flashes 03/2017   Hyperlipidemia 03/20/2016   Morbid obesity with body mass index (BMI) of 40.0 to 49.9 (HCC) 09/17/2017   NCGS (non-celiac gluten sensitivity)    Pre-diabetes 07/26/2018   Hgb A1C elevated on 07/26/2018, Gestational Diabetes 2012   Seasonal allergies 2012   seasonal allergies causes allergic rhinitis and itchy, dry eyes per pt   Vitamin D deficiency 05/2015    Patient Active Problem List   Diagnosis Date Noted   Polyphagia 05/16/2020   Vitamin D deficiency 05/16/2020   At risk for osteoporosis 05/16/2020   Prediabetes 05/01/2020   Elevated blood pressure reading 05/01/2020   At risk for impaired metabolic function 12/03/9483   Breast asymmetry following reconstructive surgery 09/20/2019   Benign essential HTN 08/17/2018   Class 3 severe obesity with serious comorbidity and body mass index (BMI) of 40.0 to 44.9 in adult [facility]) 09/17/2017   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    Malignant neoplasm of upper-inner quadrant of left breast in female, estrogen receptor positive ([place]) 07/27/2017   Genetic testing 03/25/2017   Malignant 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, estrogen receptor positive ([place]) 01/23/2017   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, left ([place]) 01/23/2017    Past Surgical History:  Procedure Laterality Date   BREAST RECONSTRUCTION WITH PLACEMENT OF TISSUE EXPANDER AND FLEX HD (ACELLULAR HYDRATED DERMIS) Left 08/27/2017   Procedure: LEFT BREAST RECONSTRUCTION WITH PLACEMENT OF TISSUE EXPANDER AND FLEX HD;  Surgeon: Wallace Going, DO;  Location: [facility];  Service: Plastics;  Laterality: Left;   CESAREAN SECTION     x2   LATISSIMUS FLAP TO BREAST Left 11/03/2018   LATISSIMUS FLAP TO BREAST Left 11/03/2018   Procedure: LATISSIMUS FLAP TO LEFT BREAST;  Surgeon: Wallace Going, DO;  Location: [facility];  Service: Plastics;  Laterality: Left;   MASTECTOMY MODIFIED RADICAL Left 08/27/2017   MASTECTOMY MODIFIED RADICAL Left 08/27/2017   Procedure: LEFT MODIFIED RADICAL MASTECTOMY;  Surgeon: Erroll Luna, MD;  Location: [facility];  Service: General;  Laterality: Left;   MASTOPEXY Right 03/03/2019   Procedure: RIGHT BREAST MASTOPEXY/REDUCTION;  Surgeon: Wallace Going, DO;  Location: [facility];  Service: Plastics;  Laterality: Right;  3 hours, please   PORT-A-CATH REMOVAL Right 08/27/2017   Procedure: REMOVAL PORT-A-CATH RIGHT CHEST;  Surgeon: Erroll Luna, MD;  Location: [facility];  Service: General;  Laterality:  Right;   PORTACATH PLACEMENT Right 01/28/2017   Procedure: INSERTION PORT-A-CATH WITH ULTRASOUND;  Surgeon: Erroll Luna, MD;  Location: [facility];  Service: General;  Laterality: Right;   REDUCTION MAMMAPLASTY Right    REMOVAL OF TISSUE EXPANDER AND PLACEMENT OF IMPLANT Left 03/03/2019   Procedure: REMOVAL OF TISSUE EXPANDER AND PLACEMENT OF EXPANDER;  Surgeon: Wallace Going, DO;  Location: [facility];  Service: Plastics;  Laterality: Left;   REMOVAL OF TISSUE EXPANDER AND PLACEMENT OF IMPLANT Left 05/25/2019   Procedure: LEFT BREAST REMOVAL OF TISSUE EXPANDER AND PLACEMENT OF IMPLANT;  Surgeon: Wallace Going, DO;  Location: [facility];  Service: Plastics;  Laterality: Left;   TISSUE EXPANDER PLACEMENT Left 11/03/2018   Procedure: PLACEMENT OF TISSUE EXPANDER LEFT BREAST;  Surgeon: Wallace Going, DO;  Location: [facility];  Service: Plastics;  Laterality: Left;  Total case time is 3.5 hours   TUBAL LIGATION Bilateral 01/21/2011     OB History     Gravida  8   Para  4   Term  3   Preterm      AB  2   Living  4      SAB      IAB      Ectopic      Multiple      Live Births  1           Family History  Problem Relation Age of Onset   Breast cancer Paternal Grandmother 23       d.60s from breast cancer. Did not have treatment.   Other Mother        U.44 from complications of surgery to remove brain tumor   Diabetes Mother    Hypertension Mother    Stroke Mother    Kidney disease Father    Hypertension Other    Diabetes Other    Stroke Other     Social History   Tobacco Use   Smoking status: Never   Smokeless tobacco: Never  Vaping Use   Vaping Use: Never used  Substance Use Topics   Alcohol use: Yes    Comment: social   Drug use: No    Home Medications Prior to Admission medications   Medication Sig Start Date End Date Taking? Authorizing Provider  acetaminophen (TYLENOL) 500 MG tablet Take 1,000 mg by mouth every 6 (six) hours as needed (for pain/headaches.).     [provider]  B Complex Vitamins (VITAMIN B COMPLEX PO) Take 1 capsule by mouth 2 (two) times daily.     [provider]  Calcium Carb-Cholecalciferol (CALCIUM 600+D3 PO) Take 1 tablet by mouth 2 (two) times daily.     [provider]  celecoxib (CELEBREX) 200 MG capsule Take 1  capsule (200 mg total) by mouth 2 (two) times daily. 04/05/21   Leandrew Koyanagi, MD  cetirizine (ZYRTEC) 10 MG tablet Take 10 mg by mouth daily.    [provider]  Diclofenac Sodium (PENNSAID) 2 % SOLN Apply 2 g topically 2 (two) times daily as needed (to affected area). 04/05/21   Leandrew Koyanagi, MD  gabapentin (NEURONTIN) 300 MG capsule Take 1 capsule (300 mg total) by mouth at bedtime. 02/15/21   Magrinat, [name], MD  Investigational ribociclib [facility]) 200 MG tablet [name] Take 2 tablets (400 mg total) by mouth daily. Take 2 tablets (400mg  total) by mouth daily on days 1-21.  Repeat every 28  days. 03/05/21   Magrinat, [name], MD  letrozole [facility]) 2.5 MG tablet Take 1 tablet (2.5 mg total) by mouth daily. 03/11/21   Magrinat, [name], MD  loratadine (CLARITIN) 10 MG tablet Take 1 tablet (10 mg total) by mouth daily. Patient taking differently: Take 10 mg by mouth daily as needed for allergies. 02/11/17   Magrinat, [name], MD  metFORMIN (GLUCOPHAGE) 500 MG tablet Take 1 tablet (500 mg total) by mouth 2 (two) times daily with a meal. 02/06/21   Danford, Valetta Fuller D, NP  Multiple Vitamin (MULTIVITAMIN WITH MINERALS) TABS tablet Take 1 tablet by mouth daily.     [provider]  Omega-3 Fatty Acids (FISH OIL) 1000 MG CAPS Take 1,000 mg by mouth 2 (two) times daily.     [provider]  omeprazole (PRILOSEC) 40 MG capsule Take 1 capsule (40 mg total) by mouth daily. 12/12/20   Magrinat, [name], MD  phenylephrine (SUDAFED PE) 10 MG TABS tablet Take 10 mg by mouth every 4 (four) hours as needed.  06/23/18   [provider]  Probiotic Product (PROBIOTIC DAILY PO) Take 1 capsule by mouth 2 (two) times daily.     [provider]  Semaglutide, 2 MG/DOSE, (OZEMPIC, 2 MG/DOSE,) 8 MG/3ML SOPN Inject 2 mg into the skin once a week. 02/27/21   Danford, Valetta Fuller D, NP  ZINC OXIDE PO Take 1 tablet by mouth 2 (two) times a day.    [provider]    Allergies     Dilaudid [hydromorphone]  Review of Systems   Review of Systems  Constitutional:  Negative for fever.  HENT:  Negative for ear pain.   Eyes:  Negative for pain.  Respiratory:  Negative for cough.   Cardiovascular:  Positive for chest pain.  Gastrointestinal:  Negative for abdominal pain.  Genitourinary:  Negative for flank pain.  Musculoskeletal:  Negative for back pain.  Skin:  Negative for rash.  Neurological:  Negative for headaches.   Physical Exam Updated Vital Signs BP (!) 144/73   Pulse 86   Temp 98.4 F (36.9 C) (Oral)   Resp (!) 23   Ht 5\' 6"  (1.676 m)   Wt 110.2 kg   LMP 02/10/2017   SpO2 100%   BMI 39.22 kg/m   Physical Exam Constitutional:      General: She is not in acute distress.    Appearance: Normal appearance.  HENT:     Head: Normocephalic.     Nose: Nose normal.  Eyes:     Extraocular Movements: Extraocular movements intact.  Cardiovascular:     Rate and Rhythm: Normal rate.  Pulmonary:     Effort: Pulmonary effort is normal.  Musculoskeletal:        General: Normal range of motion.     Cervical back: Normal range of motion.     Right lower leg: No edema.     Left lower leg: No edema.  Neurological:     General: No focal deficit present.     Mental Status: She is alert. Mental status is at baseline.    ED Results / Procedures / Treatments   Labs (all labs ordered are listed, but only abnormal results are displayed) Labs Reviewed  CBC - Abnormal; Notable for the following components:      Result Value   Hemoglobin 11.6 (*)    HCT 35.5 (*)    All other components within normal limits  D-DIMER, QUANTITATIVE - Abnormal; Notable for the  following components:   D-Dimer, Quant 0.76 (*)    All other components within normal limits  BASIC METABOLIC PANEL  TROPONIN I (HIGH SENSITIVITY)  TROPONIN I (HIGH SENSITIVITY)    EKG EKG Interpretation  Date/Time:  Sunday April 07 2021 21:46:08 EDT Ventricular Rate:  90 PR  Interval:  168 QRS Duration: 94 QT Interval:  366 QTC Calculation: 447 R Axis:   67 Text Interpretation: Normal sinus rhythm Normal ECG Confirmed by Thamas Jaegers ([id number]) on 04/07/2021 10:27:30 PM  Radiology DG Chest Portable 1 View  Result Date: 04/07/2021 CLINICAL DATA:  chest pain, SOB EXAM: PORTABLE CHEST 1 VIEW COMPARISON:  Cxr 06/28/18, ct vhrdy 02/05/17 FINDINGS: The heart and mediastinal contours are within normal limits. No focal consolidation. No pulmonary edema. No pleural effusion. No pneumothorax. No acute osseous abnormality.Left axillary vascular clips. IMPRESSION: No active disease. Electronically Signed   By: Iven Finn M.D.   On: 04/07/2021 22:20    Procedures Procedures   Medications Ordered in ED Medications  sodium chloride 0.9 % bolus 1,000 mL (has no administration in time range)    ED Course  I have reviewed the triage vital signs and the nursing notes.  Pertinent labs & imaging results that were available during my care of the patient were reviewed by me and considered in my medical decision making (see chart for details).    MDM Rules/Calculators/A&P                           Basic labs been sent.  EKG shows sinus rhythm, no ST elevation depressions or T wave inversions noted.  Rate is normal.  Given history of cancer and elevated D-dimer CT angio will be pursued.  Patient signed out to oncoming provider.  Final Clinical Impression(s) / ED Diagnoses Final diagnoses:  Chest pain, unspecified type    Rx / DC Orders ED Discharge Orders     None        Luna Fuse, MD 04/07/21 2324

## 2021-04-07 NOTE — ED Triage Notes (Signed)
Pt is present for central chest pain that started this morning and has progressively worsened throughout the day. Pt states the pain has now radiated to her back and she has become short of breath. Pt states chest pain is worse when moving around. Pt describes it has a tightness. Denies N/V or being diaphoretic. Denies hx of blood clots.

## 2021-04-08 ENCOUNTER — Telehealth: Payer: Self-pay | Admitting: Orthopaedic Surgery

## 2021-04-08 DIAGNOSIS — Z7984 Long term (current) use of oral hypoglycemic drugs: Secondary | ICD-10-CM | POA: Diagnosis not present

## 2021-04-08 DIAGNOSIS — Z853 Personal history of malignant neoplasm of breast: Secondary | ICD-10-CM | POA: Diagnosis not present

## 2021-04-08 DIAGNOSIS — I10 Essential (primary) hypertension: Secondary | ICD-10-CM | POA: Diagnosis not present

## 2021-04-08 DIAGNOSIS — R0602 Shortness of breath: Secondary | ICD-10-CM | POA: Diagnosis not present

## 2021-04-08 DIAGNOSIS — R7989 Other specified abnormal findings of blood chemistry: Secondary | ICD-10-CM | POA: Diagnosis not present

## 2021-04-08 DIAGNOSIS — R0789 Other chest pain: Secondary | ICD-10-CM | POA: Diagnosis not present

## 2021-04-08 DIAGNOSIS — K449 Diaphragmatic hernia without obstruction or gangrene: Secondary | ICD-10-CM | POA: Diagnosis not present

## 2021-04-08 LAB — TROPONIN I (HIGH SENSITIVITY): Troponin I (High Sensitivity): 2 ng/L (ref <18)

## 2021-04-08 NOTE — Discharge Instructions (Addendum)
Your work-up today is reassuring.  Follow-up with cardiology as an outpatient for stress testing.

## 2021-04-08 NOTE — Progress Notes (Deleted)
Cardiology Office Note    Date:  04/08/2021   ID:  Mackenzie [name], DOB [birth year], MRN [id number]  PCP:  [name], MD  Cardiologist:  Fransico Him, MD  Electrophysiologist:  None   Chief Complaint: ***  History of Present Illness:   Mackenzie [name] is a 50 y.o. female with history of breast CA, anemia, DM, GERD, HLD, obesity who presents for ER follow-up. She saw Dr. Radford Pax once in 08/2018 for abnormal EKG in the context of breast CA and chemotherapy. She's had previous treatment with cyclophosphamide, doxorubicin, caroplatinum, gemcitabine, goserelin, ribociclb and letrozole per notes. She has also had surgery and radiation. 2D echo 05/2018 shoe global LV longitudinal LV strain abnormal at -14.2% but poor images lead to inaccurate tracing, EF 50-55%, poor acoustic windows overall. Per Dr. Theodosia Blender note, "At her last office visit in January she was noted to have an EKG showing normal sinus rhythm with sinus arrhythmia sinus possible inferior MI probably old with posterior extension.  She then had an EKG done in the oncology office which was completely normal on a different machine showing normal sinus rhythm with no evidence of infarct.  Dr. Lindi Adie with oncology reviewed the EKG and felt there was no need for further cardiology consult but a consult was made anyway.  I do not have access to the initial EKG that was done that showed an abnormality but I reviewed the second EKG done in the clinic and that was completely normal." Dr. Radford Pax did recommend a f/u echo with [facility] but did not occur.  She was seen in the ED with chest pain 04/07/21. Troponins neg x 2. D-dimer mildly elevated prompting CTA with small hiatal hernia, no PE, no significant coronary calcification.  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, left ([place]) 01/23/2017   Edema, lower extremity    Hot flashes 03/2017   Hyperlipidemia 03/20/2016   Morbid obesity with body mass index (BMI) of 40.0 to 49.9 (HCC) 09/17/2017   NCGS (non-celiac gluten sensitivity)    Pre-diabetes 07/26/2018   Hgb A1C elevated on 07/26/2018, Gestational Diabetes 2012   Seasonal allergies 2012   seasonal allergies causes allergic rhinitis and itchy, dry eyes per pt   Vitamin D deficiency 05/2015    Past Surgical History:  Procedure Laterality Date   BREAST RECONSTRUCTION WITH PLACEMENT OF TISSUE EXPANDER AND FLEX HD (ACELLULAR HYDRATED DERMIS) Left 08/27/2017   Procedure: LEFT BREAST RECONSTRUCTION WITH PLACEMENT OF TISSUE EXPANDER AND FLEX HD;  Surgeon: Wallace Going, DO;  Location: [place];  Service: Plastics;  Laterality: Left;   CESAREAN SECTION     x2   LATISSIMUS FLAP TO BREAST Left 11/03/2018   LATISSIMUS FLAP TO BREAST Left 11/03/2018   Procedure: LATISSIMUS FLAP TO LEFT BREAST;  Surgeon: Wallace Going, DO;  Location: [place];  Service: Plastics;  Laterality: Left;   MASTECTOMY MODIFIED RADICAL Left 08/27/2017   MASTECTOMY MODIFIED RADICAL Left 08/27/2017   Procedure: LEFT MODIFIED RADICAL  MASTECTOMY;  Surgeon: Erroll Luna, MD;  Location: [place];  Service: General;  Laterality: Left;   MASTOPEXY Right 03/03/2019   Procedure: RIGHT BREAST MASTOPEXY/REDUCTION;  Surgeon: Wallace Going, DO;  Location: [facility];  Service: Plastics;  Laterality: Right;  3 hours, please   PORT-A-CATH REMOVAL Right 08/27/2017   Procedure: REMOVAL PORT-A-CATH RIGHT CHEST;  Surgeon: Erroll Luna, MD;  Location: [place] [facility];  Service: General;  Laterality: Right;   PORTACATH PLACEMENT Right 01/28/2017   Procedure: INSERTION PORT-A-CATH WITH ULTRASOUND;  Surgeon: Erroll Luna, MD;  Location: [place];  Service: General;  Laterality: Right;   REDUCTION MAMMAPLASTY Right    REMOVAL OF TISSUE EXPANDER AND PLACEMENT OF IMPLANT Left 03/03/2019   Procedure: REMOVAL OF TISSUE EXPANDER AND PLACEMENT OF EXPANDER;  Surgeon: Wallace Going, DO;  Location: [place];  Service: Plastics;  Laterality: Left;   REMOVAL OF TISSUE EXPANDER AND PLACEMENT OF IMPLANT Left 05/25/2019   Procedure: LEFT BREAST REMOVAL OF TISSUE EXPANDER AND PLACEMENT OF IMPLANT;  Surgeon: Wallace Going, DO;  Location: [place];  Service: Plastics;  Laterality: Left;   TISSUE EXPANDER PLACEMENT Left 11/03/2018   Procedure: PLACEMENT OF TISSUE EXPANDER LEFT BREAST;  Surgeon: Wallace Going, DO;  Location: [place];  Service: Plastics;  Laterality: Left;  Total case time is 3.5 hours   TUBAL LIGATION Bilateral 01/21/2011    Current Medications: No outpatient medications have been marked as taking for the 04/09/21 encounter (Appointment) with Charlie Pitter, PA-C.   ***   Allergies:   Dilaudid [hydromorphone]   Social History   Socioeconomic History   Marital status: Legally Separated    Spouse name: Not on file   Number of children: Not on file   Years of education: Not on file   Highest education level: Not on file  Occupational History   Occupation: RN  Tobacco Use   Smoking status: Never   Smokeless tobacco: Never  Vaping Use   Vaping Use: Never used  Substance and Sexual Activity   Alcohol use: Yes    Comment: social   Drug use: No   Sexual activity: Not on file    Comment: BTL  Other Topics Concern   Not on file  Social History Narrative   Not on file   Social Determinants of Health   Financial Resource Strain: Not on file  Food Insecurity: Not on file   Transportation Needs: Not on file  Physical Activity: Not on file  Stress: Not on file  Social Connections: Not on file     Family History:  The patient's ***family history includes Breast cancer (age of onset: 66) in her paternal grandmother; Diabetes in her mother and another family member; Hypertension in her mother and another family member; Kidney disease in her father; Other in her mother; Stroke in her mother and another family member.  ROS:   Please see the history of present illness. Otherwise, review of systems is positive for ***.  All other systems are reviewed and otherwise negative.    EKGs/Labs/Other Studies Reviewed:    Studies reviewed are outlined and summarized above. Reports included below if pertinent.  2d echo 05/2018 - Left ventricle: Global LV longitudinal LV strain is abnormal at    -14.2% but poor images on endocardial segments lead to inaccurate    tracking and should not be used. The cavity size was normal.    Systolic function was normal. The estimated  ejection fraction was    in the range of 50% to 55%. Images were inadequate for LV wall    motion assessment.  - Aortic valve: Valve area (VTI): 2.06 cm^2. Valve area (Vmax):    2.26 cm^2. Valve area (Vmean): 2.16 cm^2.  - Pulmonary arteries: Systolic pressure could not be accurately    estimated.  - Recommendations: Poor acoustical windows limits accurate    assessment of EF or wall motion. Overall LVF appears normal to    low normal but recommend repeat limited study with definity    contrast since study is to follow LVF during chemotherapy.   Recommendations:  Poor acoustical windows limits accurate  assessment of EF or wall motion. Overall LVF appears normal to low  normal but recommend repeat limited study with definity contrast  since study is to follow LVF during chemotherapy.     EKG:  EKG is ordered today, personally reviewed, demonstrating ***  Recent Labs: 03/01/2021: ALT 17; Magnesium  1.8 04/07/2021: BUN 25; Creatinine, Ser 0.88; Hemoglobin 11.6; Platelets 237; Potassium 3.7; Sodium 137  Recent Lipid Panel    Component Value Date/Time   CHOL 202 (H) 11/15/2020 0917   TRIG 52 11/15/2020 0917   HDL 58 11/15/2020 0917   CHOLHDL 3.5 11/15/2020 0917   CHOLHDL 3.4 06/27/2020 0800   VLDL 7 06/27/2020 0800   LDLCALC 135 (H) 11/15/2020 0917    PHYSICAL EXAM:    VS:  LMP 02/10/2017   BMI: There is no height or weight on file to calculate BMI.  GEN: Well nourished, well developed female in no acute distress HEENT: normocephalic, atraumatic Neck: no JVD, carotid bruits, or masses Cardiac: ***RRR; no murmurs, rubs, or gallops, no edema  Respiratory:  clear to auscultation bilaterally, normal work of breathing GI: soft, nontender, nondistended, + BS MS: no deformity or atrophy Skin: warm and dry, no rash Neuro:  Alert and Oriented x 3, Strength and sensation are intact, follows commands Psych: euthymic mood, full affect  Wt Readings from Last 3 Encounters:  04/07/21 243 lb (110.2 kg)  04/03/21 243 lb (110.2 kg)  03/13/21 248 lb (112.5 kg)     ASSESSMENT & PLAN:   ***     Disposition: F/u with ***   Medication Adjustments/Labs and Tests Ordered: Current medicines are reviewed at length with the patient today.  Concerns regarding medicines are outlined above. Medication changes, Labs and Tests ordered today are summarized above and listed in the Patient Instructions accessible in Encounters.   Signed, Charlie Pitter, PA-C  04/08/2021 10:35 AM    [facility] [address], [place], [place]  [address] Phone: [phone]; Fax: [phone]

## 2021-04-08 NOTE — Telephone Encounter (Signed)
Noted  

## 2021-04-08 NOTE — ED Provider Notes (Signed)
Patient signed out pending repeat troponin and CT angio.  CT does not show any evidence of PE.  Repeat troponin is 3.  Patient reassessed.  We discussed results.  Patient has not ever had definitive stress testing as an outpatient.  Feel she is low risk enough to have this done as an outpatient.  She will begin formal referral to cardiology.  Physical Exam  BP 133/64   Pulse 88   Temp 98.4 F (36.9 C) (Oral)   Resp 19   Ht 1.676 m (5\' 6" )   Wt 110.2 kg   LMP 02/10/2017   SpO2 100%   BMI 39.22 kg/m   Physical Exam  ED Course/Procedures     Procedures  MDM   Problem List Items Addressed This Visit   None Visit Diagnoses     Chest pain, unspecified type    -  Primary             Merryl Hacker, MD 04/08/21 [phone]

## 2021-04-08 NOTE — Telephone Encounter (Signed)
[place] from [facility] called needing pre authorizations on medications. Please send pre auth. Phone number is [phone].

## 2021-04-09 ENCOUNTER — Telehealth: Payer: Self-pay | Admitting: *Deleted

## 2021-04-09 ENCOUNTER — Ambulatory Visit: Payer: 59 | Admitting: Physician Assistant

## 2021-04-09 ENCOUNTER — Ambulatory Visit: Payer: 59 | Admitting: Student

## 2021-04-09 DIAGNOSIS — D649 Anemia, unspecified: Secondary | ICD-10-CM

## 2021-04-09 DIAGNOSIS — Z853 Personal history of malignant neoplasm of breast: Secondary | ICD-10-CM

## 2021-04-09 DIAGNOSIS — R079 Chest pain, unspecified: Secondary | ICD-10-CM

## 2021-04-09 DIAGNOSIS — E785 Hyperlipidemia, unspecified: Secondary | ICD-10-CM

## 2021-04-09 NOTE — Telephone Encounter (Signed)
Pt has been rescheduled to see Sheela Stack, NP, 05/01/2021 @ [facility].

## 2021-04-09 NOTE — Telephone Encounter (Signed)
Left pt a detailed message that her appointment today, 04/09/2021, with Melina Copa, PA-C, @ 9;15 has been cancelled and to call back to reschedule.

## 2021-04-10 ENCOUNTER — Encounter: Payer: Self-pay | Admitting: Physical Therapy

## 2021-04-10 ENCOUNTER — Ambulatory Visit (INDEPENDENT_AMBULATORY_CARE_PROVIDER_SITE_OTHER): Payer: 59 | Admitting: Physical Therapy

## 2021-04-10 ENCOUNTER — Other Ambulatory Visit: Payer: Self-pay

## 2021-04-10 ENCOUNTER — Encounter: Payer: Self-pay | Admitting: Student

## 2021-04-10 ENCOUNTER — Ambulatory Visit: Payer: 59 | Admitting: Student

## 2021-04-10 ENCOUNTER — Encounter: Payer: Self-pay | Admitting: Oncology

## 2021-04-10 VITALS — BP 141/85 | HR 90 | Temp 97.7°F | Resp 17 | Ht 66.0 in | Wt 247.6 lb

## 2021-04-10 DIAGNOSIS — M25562 Pain in left knee: Secondary | ICD-10-CM | POA: Diagnosis not present

## 2021-04-10 DIAGNOSIS — G8929 Other chronic pain: Secondary | ICD-10-CM

## 2021-04-10 DIAGNOSIS — R0609 Other forms of dyspnea: Secondary | ICD-10-CM | POA: Diagnosis not present

## 2021-04-10 DIAGNOSIS — R600 Localized edema: Secondary | ICD-10-CM | POA: Diagnosis not present

## 2021-04-10 DIAGNOSIS — M25561 Pain in right knee: Secondary | ICD-10-CM | POA: Diagnosis not present

## 2021-04-10 DIAGNOSIS — R262 Difficulty in walking, not elsewhere classified: Secondary | ICD-10-CM | POA: Diagnosis not present

## 2021-04-10 DIAGNOSIS — M940 Chondrocostal junction syndrome [Tietze]: Secondary | ICD-10-CM

## 2021-04-10 DIAGNOSIS — R072 Precordial pain: Secondary | ICD-10-CM

## 2021-04-10 NOTE — Progress Notes (Signed)
Primary Physician/Referring:  [name], MD  Patient ID: Mackenzie [name], female    DOB: 09/05/1970, 50 y.o.   MRN: [id number]  Chief Complaint  Patient presents with   Chest Pain   Abnormal ECG   Follow-up   HPI:    Mackenzie [name]  is a 50 y.o. AA female with history hyperlipidemia, hyperglycemia, obesity.  She is referred to our office for evaluation of chest pain over the last 3 days.  Patient has been following with weight management clinic and over the last several months has lost approximately 25 pounds.  She is also increased her physical activity walking and doing Zumba classes regularly.  However 3 days ago patient developed substernal chest pain radiating to her back worse with exertion and deep breaths.  Also notes tenderness to palpation along the right side of her sternum.  Patient's chest pain prompted her to seek evaluation in the emergency department 04/07/2021.  Work-up in the ED noted troponins negative x3, normal chest x-ray, normal CT angio of the chest.  Work-up was relatively unremarkable, although EKG showed some questionable ST elevations in anterior leads concerning for pericarditis.  Therefore saw patient on an urgent basis in our office.  Patient continues to have chest pain at today's office visit.  Denies dizziness, syncope, near syncope, orthopnea, leg edema.  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   Allergic rhinitis 2012   Anemia 01/27/2017   prior to starting chemotherapy   Breast cancer ([place]) 01/14/2017   Left breast   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, left ([place]) 01/23/2017   Edema, lower extremity    Hot flashes 03/2017   Hyperlipidemia 03/20/2016   Morbid obesity with body mass index (BMI) of 40.0 to 49.9 (HCC) 09/17/2017   NCGS (non-celiac gluten sensitivity)    Pre-diabetes 07/26/2018   Hgb A1C elevated on 07/26/2018, Gestational Diabetes 2012    Seasonal allergies 2012   seasonal allergies causes allergic rhinitis and itchy, dry eyes per pt   Vitamin D deficiency 05/2015   Past Surgical History:  Procedure Laterality Date   BREAST RECONSTRUCTION WITH PLACEMENT OF TISSUE EXPANDER AND FLEX HD (ACELLULAR HYDRATED DERMIS) Left 08/27/2017   Procedure: LEFT BREAST RECONSTRUCTION WITH PLACEMENT OF TISSUE EXPANDER AND FLEX HD;  Surgeon: Wallace Going, DO;  Location: [facility];  Service: Plastics;  Laterality: Left;   CESAREAN SECTION     x2   LATISSIMUS FLAP TO BREAST Left 11/03/2018   LATISSIMUS FLAP TO BREAST Left 11/03/2018   Procedure: LATISSIMUS FLAP TO LEFT BREAST;  Surgeon: Wallace Going, DO;  Location: [facility];  Service: Plastics;  Laterality: Left;   MASTECTOMY MODIFIED RADICAL Left 08/27/2017   MASTECTOMY MODIFIED RADICAL Left 08/27/2017   Procedure: LEFT MODIFIED RADICAL MASTECTOMY;  Surgeon: Erroll Luna, MD;  Location: [facility];  Service: General;  Laterality: Left;   MASTOPEXY Right 03/03/2019   Procedure: RIGHT BREAST MASTOPEXY/REDUCTION;  Surgeon: Wallace Going, DO;  Location: [facility];  Service: Plastics;  Laterality: Right;  3 hours, please   PORT-A-CATH REMOVAL Right 08/27/2017   Procedure: REMOVAL PORT-A-CATH RIGHT CHEST;  Surgeon: Erroll Luna, MD;  Location: [facility];  Service: General;  Laterality: Right;   PORTACATH PLACEMENT Right 01/28/2017   Procedure: INSERTION PORT-A-CATH WITH ULTRASOUND;  Surgeon: Erroll Luna, MD;  Location: [facility];  Service: General;  Laterality: Right;   REDUCTION MAMMAPLASTY  Right    REMOVAL OF TISSUE EXPANDER AND PLACEMENT OF IMPLANT Left 03/03/2019   Procedure: REMOVAL OF TISSUE EXPANDER AND PLACEMENT OF EXPANDER;  Surgeon: Wallace Going, DO;  Location: [facility];  Service: Plastics;  Laterality: Left;   REMOVAL OF TISSUE EXPANDER AND PLACEMENT OF IMPLANT Left 05/25/2019   Procedure: LEFT BREAST REMOVAL OF TISSUE  EXPANDER AND PLACEMENT OF IMPLANT;  Surgeon: Wallace Going, DO;  Location: [facility];  Service: Plastics;  Laterality: Left;   TISSUE EXPANDER PLACEMENT Left 11/03/2018   Procedure: PLACEMENT OF TISSUE EXPANDER LEFT BREAST;  Surgeon: Wallace Going, DO;  Location: [facility];  Service: Plastics;  Laterality: Left;  Total case time is 3.5 hours   TUBAL LIGATION Bilateral 01/21/2011   Family History  Problem Relation Age of Onset   Other Mother        M.42 from complications of surgery to remove brain tumor   Diabetes Mother    Hypertension Mother    Stroke Mother    Kidney disease Father    Breast cancer Paternal Grandmother 35       d.60s from breast cancer. Did not have treatment.   Hypertension Other    Diabetes Other    Stroke Other     Social History   Tobacco Use   Smoking status: Never   Smokeless tobacco: Never  Substance Use Topics   Alcohol use: Yes    Comment: social   Marital Status: Legally Separated   ROS  Review of Systems  Cardiovascular:  Positive for chest pain and dyspnea on exertion. Negative for claudication, leg swelling, near-syncope, orthopnea, palpitations, paroxysmal nocturnal dyspnea and syncope.  Neurological:  Negative for dizziness.   Objective  Blood pressure (!) 141/85, pulse 90, temperature 97.7 F (36.5 C), temperature source Temporal, resp. rate 17, height 5\' 6"  (1.676 m), weight 247 lb 9.6 oz (112.3 kg), last menstrual period 02/10/2017, SpO2 98 %, unknown if currently breastfeeding.  Vitals with BMI 04/10/2021 04/10/2021 04/08/2021  Height - 5\' 6"  -  Weight - 247 lbs 10 oz -  BMI - 68.34 -  Systolic 196 222 979  Diastolic 85 87 78  Pulse 90 91 86  Some encounter information is confidential and restricted. Go to Review Flowsheets activity to see all data.      Physical Exam Vitals reviewed.  Constitutional:      Appearance: She is obese.  Cardiovascular:     Rate and Rhythm: Normal rate and regular rhythm.      Pulses: Intact distal pulses.     Heart sounds: S1 normal and S2 normal. No murmur heard.   No gallop.  Pulmonary:     Effort: Pulmonary effort is normal. No respiratory distress.     Breath sounds: No wheezing, rhonchi or rales.  Chest:    Musculoskeletal:     Right lower leg: No edema.     Left lower leg: No edema.  Neurological:     Mental Status: She is alert.    Laboratory examination:   Recent Labs    12/11/20 0917 02/27/21 0854 03/01/21 0905 04/07/21 2227  NA 134* 137 134* 137  K 3.9 4.5 3.9 3.7  CL 103 100 103 103  CO2 25 21 24 27   GLUCOSE 83 80 80 100*  BUN 15 16 17  25*  CREATININE 0.90 1.03* 0.88 0.88  CALCIUM 9.1 9.6 9.3 9.3  GFRNONAA >60  --  >60 >60   estimated creatinine clearance is  95 mL/min (by C-G formula based on SCr of 0.88 mg/dL).  CMP Latest Ref Rng & Units 04/07/2021 03/01/2021 02/27/2021  Glucose 70 - 99 mg/dL 100(H) 80 80  BUN 6 - 20 mg/dL 25(H) 17 16  Creatinine 0.44 - 1.00 mg/dL 0.88 0.88 1.03(H)  Sodium 135 - 145 mmol/L 137 134(L) 137  Potassium 3.5 - 5.1 mmol/L 3.7 3.9 4.5  Chloride 98 - 111 mmol/L 103 103 100  CO2 22 - 32 mmol/L 27 24 21   Calcium 8.9 - 10.3 mg/dL 9.3 9.3 9.6  Total Protein 6.5 - 8.1 g/dL - 7.7 7.5  Total Bilirubin 0.3 - 1.2 mg/dL - 0.3 0.3  Alkaline Phos 38 - 126 U/L - 107 109  AST 15 - 41 U/L - 19 20  ALT 0 - 44 U/L - 17 18   CBC Latest Ref Rng & Units 04/07/2021 03/01/2021 12/11/2020  WBC 4.0 - 10.5 K/uL 4.9 4.0 3.9(L)  Hemoglobin 12.0 - 15.0 g/dL 11.6(L) 11.6(L) 11.7(L)  Hematocrit 36.0 - 46.0 % 35.5(L) 35.0(L) 34.6(L)  Platelets 150 - 400 K/uL 237 255 214    Lipid Panel Recent Labs    06/27/20 0800 11/15/20 0917  CHOL 213* 202*  TRIG 33 52  LDLCALC 144* 135*  VLDL 7  --   HDL 62 58  CHOLHDL 3.4 3.5    HEMOGLOBIN A1C Lab Results  Component Value Date   HGBA1C 5.4 02/27/2021   MPG 99.67 06/27/2020   TSH No results for input(s): TSH in the last 8760 hours.  External labs:   None   Allergies    Allergies  Allergen Reactions   Dilaudid [Hydromorphone] Itching    Medications Prior to Visit:   Outpatient Medications Prior to Visit  Medication Sig Dispense Refill   acetaminophen (TYLENOL) 500 MG tablet Take 1,000 mg by mouth every 6 (six) hours as needed (for pain/headaches.).      B Complex Vitamins (VITAMIN B COMPLEX PO) Take 1 capsule by mouth 2 (two) times daily.      Calcium Carb-Cholecalciferol (CALCIUM 600+D3 PO) Take 1 tablet by mouth 2 (two) times daily.      celecoxib (CELEBREX) 200 MG capsule Take 1 capsule (200 mg total) by mouth 2 (two) times daily. 30 capsule 3   cetirizine (ZYRTEC) 10 MG tablet Take 10 mg by mouth daily.     Diclofenac Sodium (PENNSAID) 2 % SOLN Apply 2 g topically 2 (two) times daily as needed (to affected area). 112 g 3   gabapentin (NEURONTIN) 300 MG capsule Take 1 capsule (300 mg total) by mouth at bedtime. 90 capsule 4   Investigational ribociclib (KISQALI) 200 MG tablet [name] Take 2 tablets (400 mg total) by mouth daily. Take 2 tablets (400mg  total) by mouth daily on days 1-21.  Repeat every 28 days. 135 tablet 0   letrozole (FEMARA) 2.5 MG tablet Take 1 tablet (2.5 mg total) by mouth daily. 90 tablet 4   loratadine (CLARITIN) 10 MG tablet Take 1 tablet (10 mg total) by mouth daily. (Patient taking differently: Take 10 mg by mouth daily as needed for allergies.) 90 tablet 2   metFORMIN (GLUCOPHAGE) 500 MG tablet Take 1 tablet (500 mg total) by mouth 2 (two) times daily with a meal. 180 tablet 0   Multiple Vitamin (MULTIVITAMIN WITH MINERALS) TABS tablet Take 1 tablet by mouth daily.      Omega-3 Fatty Acids (FISH OIL) 1000 MG CAPS Take 1,000 mg by mouth 2 (two) times daily.  omeprazole (PRILOSEC) 40 MG capsule Take 1 capsule (40 mg total) by mouth daily. 60 capsule 1   phenylephrine (SUDAFED PE) 10 MG TABS tablet Take 10 mg by mouth every 4 (four) hours as needed.      Probiotic Product (PROBIOTIC DAILY PO) Take 1 capsule by mouth 2  (two) times daily.      Semaglutide, 2 MG/DOSE, (OZEMPIC, 2 MG/DOSE,) 8 MG/3ML SOPN Inject 2 mg into the skin once a week. 9 mL 0   ZINC OXIDE PO Take 1 tablet by mouth 2 (two) times a day.     Facility-Administered Medications Prior to Visit  Medication Dose Route Frequency Provider Last Rate Last Admin   sodium chloride flush (NS) 0.9 % injection 10 mL  10 mL Intravenous PRN Magrinat, [name], MD   10 mL at 03/10/17 1239   Final Medications at End of Visit    Current Meds  Medication Sig   acetaminophen (TYLENOL) 500 MG tablet Take 1,000 mg by mouth every 6 (six) hours as needed (for pain/headaches.).    B Complex Vitamins (VITAMIN B COMPLEX PO) Take 1 capsule by mouth 2 (two) times daily.    Calcium Carb-Cholecalciferol (CALCIUM 600+D3 PO) Take 1 tablet by mouth 2 (two) times daily.    celecoxib (CELEBREX) 200 MG capsule Take 1 capsule (200 mg total) by mouth 2 (two) times daily.   cetirizine (ZYRTEC) 10 MG tablet Take 10 mg by mouth daily.   Diclofenac Sodium (PENNSAID) 2 % SOLN Apply 2 g topically 2 (two) times daily as needed (to affected area).   gabapentin (NEURONTIN) 300 MG capsule Take 1 capsule (300 mg total) by mouth at bedtime.   Investigational ribociclib (KISQALI) 200 MG tablet [name] Take 2 tablets (400 mg total) by mouth daily. Take 2 tablets (400mg  total) by mouth daily on days 1-21.  Repeat every 28 days.   letrozole (FEMARA) 2.5 MG tablet Take 1 tablet (2.5 mg total) by mouth daily.   loratadine (CLARITIN) 10 MG tablet Take 1 tablet (10 mg total) by mouth daily. (Patient taking differently: Take 10 mg by mouth daily as needed for allergies.)   metFORMIN (GLUCOPHAGE) 500 MG tablet Take 1 tablet (500 mg total) by mouth 2 (two) times daily with a meal.   Multiple Vitamin (MULTIVITAMIN WITH MINERALS) TABS tablet Take 1 tablet by mouth daily.    Omega-3 Fatty Acids (FISH OIL) 1000 MG CAPS Take 1,000 mg by mouth 2 (two) times daily.    omeprazole (PRILOSEC) 40 MG  capsule Take 1 capsule (40 mg total) by mouth daily.   phenylephrine (SUDAFED PE) 10 MG TABS tablet Take 10 mg by mouth every 4 (four) hours as needed.    Probiotic Product (PROBIOTIC DAILY PO) Take 1 capsule by mouth 2 (two) times daily.    Semaglutide, 2 MG/DOSE, (OZEMPIC, 2 MG/DOSE,) 8 MG/3ML SOPN Inject 2 mg into the skin once a week.   ZINC OXIDE PO Take 1 tablet by mouth 2 (two) times a day.   Radiology:   No results found. CT angio chest 04/07/2021: No acute pulmonary embolism. No acute intrathoracic pathology identified. No definite radiographic explanation for the patient's reported symptoms. Small hiatal hernia.  Chest x-ray 04/07/2021: The heart and mediastinal contours are within normal limits. No focal consolidation. No pulmonary edema. No pleural effusion. No pneumothorax. No acute osseous abnormality.Left axillary vascular clips.  Cardiac Studies:   Echocardiogram 05/11/18:  Left ventricle: Global LV longitudinal LV strain is abnormal at    -  14.2% but poor images on endocardial segments lead to inaccurate    tracking and should not be used. The cavity size was normal.    Systolic function was normal. The estimated ejection fraction was    in the range of 50% to 55%. Images were inadequate for LV wall    motion assessment.  - Aortic valve: Valve area (VTI): 2.06 cm^2. Valve area (Vmax):    2.26 cm^2. Valve area (Vmean): 2.16 cm^2.  - Pulmonary arteries: Systolic pressure could not be accurately    estimated.  - Recommendations: Poor acoustical windows limits accurate    assessment of EF or wall motion. Overall LVF appears normal to    low normal but recommend repeat limited study with definity    contrast since study is to follow LVF during chemotherapy.   EKG:   04/10/2021: Sinus rhythm at a rate of 83 bpm.  Normal axis.  Early repolarization, no evidence of ischemia or underlying injury pattern.  Assessment     ICD-10-CM   1. Precordial pain  R07.2 EKG 12-Lead     PCV ECHOCARDIOGRAM COMPLETE    PCV CARDIAC STRESS TEST    2. Dyspnea on exertion  R06.09 PCV ECHOCARDIOGRAM COMPLETE    PCV CARDIAC STRESS TEST    3. Costochondritis  M94.0        There are no discontinued medications.  No orders of the defined types were placed in this encounter.   Recommendations:   Mackenzie [name] is a 51 y.o. AA female with history hyperlipidemia, hyperglycemia, obesity.  She is referred to our office for evaluation of chest pain over the last 3 days.  I personally reviewed external records including recent evaluation in the emergency department.  Patient's cardiac enzymes were negative and EKG is normal.  Her physical exam at today's office visit is consistent with costochondritis as likely etiology of patient's chest pain, have a low suspicion this is related to underlying ischemia or pericarditis.  Patient's shortness of breath is likely secondary to costochondritis pain as well as obesity.  However patient does have cardiovascular risk factors including hyperlipidemia, hyperglycemia, and obesity.  We will therefore obtain echocardiogram and exercise stress test.   Encourage patient to take over-the-counter anti-inflammatory for the next few days and to use ice and heat for symptom management of costochondritis.  Advised patient to follow-up with PCP regarding management of blood pressure as it is elevated in the office today, as well as hyperlipidemia.  Encourage patient to continue to follow closely with weight management clinic and to continue to work on losing weight and increasing physical activity.   Further recommendations pending results of cardiac testing.   Patient was seen in collaboration with Dr. Einar Gip. He also reviewed patient's chart and examined the patient. Dr. Einar Gip is in agreement of the plan.    Alethia Berthold, PA-C 04/10/2021, 11:36 AM Office: [phone]

## 2021-04-10 NOTE — Therapy (Signed)
[facility] [address] [place], [place], [address] Phone: [phone]   Fax:  [phone]  Physical Therapy Evaluation  Patient Details  Name: Mackenzie [name] MRN: [id number] Date of Birth: [birth year] Referring Provider (PT): Frankey Shown MD   Encounter Date: 04/10/2021   PT End of Session - 04/10/21 0815     Visit Number 1    Number of Visits 12    Date for PT Re-Evaluation 05/24/21    Authorization Type [employer]    PT Start Time 0802    PT Stop Time 0840    PT Time Calculation (min) 38 min    Activity Tolerance Patient tolerated treatment well    Behavior During Therapy [facility] for tasks assessed/performed             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   Allergic rhinitis 2012   Anemia 01/27/2017   prior to starting chemotherapy   Breast cancer ([place]) 01/14/2017   Left breast   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, left ([place]) 01/23/2017   Edema, lower extremity    Hot flashes 03/2017   Hyperlipidemia 03/20/2016   Morbid obesity with body mass index (BMI) of 40.0 to 49.9 ([place]) 09/17/2017   NCGS (non-celiac gluten sensitivity)    Pre-diabetes 07/26/2018   Hgb A1C elevated on 07/26/2018, Gestational Diabetes 2012   Seasonal allergies 2012   seasonal allergies causes allergic rhinitis and itchy, dry eyes per pt   Vitamin D deficiency 05/2015    Past Surgical History:  Procedure Laterality Date   BREAST RECONSTRUCTION WITH PLACEMENT OF TISSUE EXPANDER AND FLEX HD (ACELLULAR HYDRATED DERMIS) Left 08/27/2017   Procedure: LEFT BREAST RECONSTRUCTION WITH PLACEMENT OF TISSUE EXPANDER AND FLEX HD;  Surgeon: Wallace Going, DO;  Location: [place];  Service: Plastics;  Laterality: Left;   CESAREAN SECTION     x2   LATISSIMUS FLAP TO BREAST Left 11/03/2018   LATISSIMUS FLAP TO BREAST Left 11/03/2018   Procedure: LATISSIMUS FLAP TO LEFT  BREAST;  Surgeon: Wallace Going, DO;  Location: [place];  Service: Plastics;  Laterality: Left;   MASTECTOMY MODIFIED RADICAL Left 08/27/2017   MASTECTOMY MODIFIED RADICAL Left 08/27/2017   Procedure: LEFT MODIFIED RADICAL MASTECTOMY;  Surgeon: Erroll Luna, MD;  Location: [place];  Service: General;  Laterality: Left;   MASTOPEXY Right 03/03/2019   Procedure: RIGHT BREAST MASTOPEXY/REDUCTION;  Surgeon: Wallace Going, DO;  Location: [place];  Service: Plastics;  Laterality: Right;  3 hours, please   PORT-A-CATH REMOVAL Right 08/27/2017   Procedure: REMOVAL PORT-A-CATH RIGHT CHEST;  Surgeon: Erroll Luna, MD;  Location: [place];  Service: General;  Laterality: Right;   PORTACATH PLACEMENT Right 01/28/2017   Procedure: INSERTION PORT-A-CATH WITH ULTRASOUND;  Surgeon: Erroll Luna, MD;  Location: [place];  Service: General;  Laterality: Right;   REDUCTION MAMMAPLASTY Right    REMOVAL OF TISSUE EXPANDER AND PLACEMENT OF IMPLANT Left 03/03/2019   Procedure: REMOVAL OF TISSUE EXPANDER AND PLACEMENT OF EXPANDER;  Surgeon: Wallace Going, DO;  Location: [place];  Service: Plastics;  Laterality: Left;   REMOVAL OF TISSUE EXPANDER AND PLACEMENT OF IMPLANT Left 05/25/2019   Procedure: LEFT BREAST REMOVAL OF TISSUE EXPANDER AND PLACEMENT OF IMPLANT;  Surgeon: Wallace Going, DO;  Location: [place];  Service: Plastics;  Laterality: Left;   TISSUE EXPANDER  PLACEMENT Left 11/03/2018   Procedure: PLACEMENT OF TISSUE EXPANDER LEFT BREAST;  Surgeon: Wallace Going, DO;  Location: [place];  Service: Plastics;  Laterality: Left;  Total case time is 3.5 hours   TUBAL LIGATION Bilateral 01/21/2011    There were no vitals filed for this visit.    Subjective Assessment - 04/10/21 0808     Subjective Pt arriving to therapy reporting 6 month history of bilateral knee pain. Pt reporting difficulty walking up and down the  stairs. 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    Pertinent History h/o lymphedema in left UE, breast CA,    Patient Stated Goals Walk up and down steps without pain    Currently in Pain? 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  No      Home Environment   Living Environment Private residence    Living Arrangements Children    Type of [place] to enter    Entrance Stairs-Number of Steps 2      Prior Function   Level of Independence Independent    Vocation Full time employment    Engineer, mining      Cognition   Overall Cognitive Status Within Functional Limits for tasks assessed      Observation/Other Assessments   Focus on Therapeutic Outcomes (FOTO)  64% (predicted 73%)      Posture/Postural Control   Posture/Postural Control Postural limitations    Postural Limitations Rounded Shoulders;Forward head;Increased lumbar lordosis      ROM / Strength   AROM / PROM / Strength AROM;Strength;PROM      AROM   Overall AROM  Deficits     AROM Assessment Site Knee    Right/Left Knee Right;Left    Right Knee Extension 0    Right Knee Flexion 116    Left Knee Extension 0    Left Knee Flexion 112      PROM   PROM Assessment Site Knee    Right/Left Knee Right;Left    Right Knee Extension -2   hyperextension   Right Knee Flexion 120    Left Knee Extension -2   hyperextension   Left Knee Flexion 115  Strength   Strength Assessment Site Knee    Right/Left Knee Right;Left    Right Knee Flexion 4+/5    Right Knee Extension 4/5    Left Knee Flexion 4+/5    Left Knee Extension 4+/5      Palpation   Patella mobility WFL    Palpation comment No tenderness noted with palpation today during assessment      Transfers   Five time sit to stand comments  18 seconds no UE support      Ambulation/Gait   Assistive device None    Gait Pattern Decreased weight shift to left;Decreased stride length;Wide base of support                        Objective measurements completed on examination: See above findings.       [place] Adult PT Treatment/Exercise - 04/10/21 0001       Exercises   Exercises Knee/Hip      Knee/Hip Exercises: Stretches   Active Hamstring Stretch 1 rep;Both;20 seconds      Knee/Hip Exercises: Seated   Other Seated Knee/Hip Exercises mini squats in front of chair x 5 with instructions to equal wt shifting between LE's.      Knee/Hip Exercises: Supine   Quad Sets Both;Strengthening;5 reps    Bridges with [employer] Strengthening;Both;5 reps;Limitations   holding 5 seconds   Straight Leg Raises Strengthening;Left;Right;5 reps    Straight Leg Raises Limitations locking into full quad set prior to lift off                     PT Education - 04/10/21 0813     Education Details PT POC, HEP    Person(s) Educated Patient    Methods Explanation;Demonstration;Handout;Verbal cues;Tactile cues    Comprehension Verbalized understanding;Returned demonstration               PT Short Term Goals - 04/10/21 0846       PT SHORT TERM GOAL #1   Title Pt will be independent in her initial HEP.    Time 3    Period Weeks    Status New    Target Date 05/03/21      PT SHORT TERM GOAL #2   Title Pt will be able to perform sit to stand in </= 14 seconds NO UE support.    Time 3    Period Weeks    Status New    Target Date 05/03/21               PT Long Term Goals - 04/10/21 1206       PT LONG TERM GOAL #1   Title Pt will be independent in her advanced HEP.    Baseline -    Time 6    Period Weeks    Status New    Target Date 05/24/21      PT LONG TERM GOAL #2   Title Pt will be able to amb community distances >/= 20 minutes with pain </= 3/10 in bilateral knees.    Baseline -    Time 6    Period Weeks    Status New    Target Date 05/24/21      PT LONG TERM GOAL #3   Title Pt will improve her bilateral knee strength to grossly 5/5 in order to improve functional mobility.    Baseline -    Time 6    Period  Weeks    Status New    Target Date 05/24/21      PT LONG TERM GOAL #4   Title Pt will be able to navigate 1 flight stairs with step over step gait pattern with single hand rail with pain </= 3/10 in bilateral knees.    Baseline -    Time 6    Period Weeks    Status New    Target Date 05/24/21      PT LONG TERM GOAL #5   Title Pt will improve her FOTO to >/= 73 %    Baseline 64% on 04/10/2021    Time 6    Period Weeks    Status New    Target Date 05/24/21                    Plan - 04/10/21 1212     Clinical Impression Statement Pt arriving today for evaluation of bilateral knee pain which has been ongoing for the last 6 months. Pt reporting her pain has progressively worsened. Pt stating difficulty with stair navigation and prolonged standing. 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 Pt stating lately she has been doing more adminstrative activities due to her knee pain  with proloned standing and walkling the floors. Pt presenting with mild weakness in her knee strength with slight deficit in knee AROM. Pt with noted increased weight shift to the left LE during sit to stand. Skilled PT needed to address pt's impairments with the below interventions.    Personal Factors and Comorbidities Comorbidity 3+    Comorbidities lymphedema, breat cancer, anemia, C-section, mastectomy, breast reconstruction, pre-diabetic    Examination-Activity Limitations Lift;Stand;Stairs;Squat;Transfers    Examination-Participation Restrictions Community Activity;Other;Occupation;Yard Work    Stability/Clinical Decision Making Stable/Uncomplicated    Clinical Decision Making Low    Rehab Potential Good    PT Frequency 2x / week    PT Duration 6 weeks    PT Treatment/Interventions ADLs/Self Care Home Management;Cryotherapy;Balance training;Therapeutic exercise;Therapeutic activities;Functional mobility training;Stair training;Gait training;Neuromuscular re-education;Ultrasound;Patient/family education;Passive range of motion;Manual techniques;Taping;Joint Manipulations;Vasopneumatic Device    PT Next Visit Plan bike vs Nustep, LE strengthening, wt shifting to right LE with stand and mini squats    PT Home Exercise Plan Access Code: [place] and Agree with Plan of Care Patient             Patient will benefit from skilled therapeutic intervention in order to improve the following deficits and impairments:  Pain, Decreased range of motion, Difficulty walking, Obesity, Decreased activity tolerance, Impaired flexibility, Postural dysfunction, Decreased strength, Decreased mobility, Increased edema  Visit Diagnosis: Chronic pain of right knee  Chronic pain of left knee  Difficulty in walking, not elsewhere classified  Localized edema     Problem List Patient Active Problem List   Diagnosis Date Noted   Polyphagia 05/16/2020   Vitamin D deficiency 05/16/2020    At risk for osteoporosis 05/16/2020   Prediabetes 05/01/2020   Elevated blood pressure reading 05/01/2020   At risk for impaired metabolic function 41/66/0630   Breast asymmetry following reconstructive surgery 09/20/2019   Benign essential HTN 08/17/2018   Class 3 severe obesity with serious comorbidity and body mass index (BMI) of 40.0 to 44.9 in adult [facility]) 09/17/2017   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   Malignant neoplasm of upper-inner quadrant of left breast in female, estrogen receptor positive ([place]) 07/27/2017   Genetic testing  03/25/2017   Malignant 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, estrogen receptor positive ([place]) 01/23/2017   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 [place], [place], [address] Phone: [phone]   Fax:  [phone]  Name: Mackenzie [name] MRN: [id number] Date of Birth: January 28, 1971

## 2021-04-10 NOTE — Patient Instructions (Signed)
Access Code: [place] URL: [email or web address] Date: 04/10/2021 Prepared by: Kearney Hard  Exercises Supine Bridge with [employer] Between Knees - 2 x daily - 7 x weekly - 2 sets - 10 reps - 5 seconds hold Seated Hamstring Stretch - 2 x daily - 7 x weekly - 3 reps - 30 seconds hold Heel Toe Raises with Unilateral Counter Support - 2 x daily - 7 x weekly - 2 sets - 10 reps Supine Active Straight Leg Raise - 2 x daily - 7 x weekly - 2 sets - 10 reps - 2-3 seconds hold Squat with Chair Touch - 2 x daily - 7 x weekly - 10 reps

## 2021-04-12 ENCOUNTER — Telehealth: Payer: Self-pay

## 2021-04-12 NOTE — Telephone Encounter (Signed)
VOB submitted for Durolane, bilateral knee. BV Pending.

## 2021-04-15 ENCOUNTER — Telehealth: Payer: Self-pay

## 2021-04-15 NOTE — Telephone Encounter (Signed)
Called and left a VM advising patient to CB to schedule for gel injection with Dr. Erlinda Hong.  Approved for Durolane, bilateral knee. [place] Patient covered at 100% of the allowable. Co-pay of $50.00 No PA required

## 2021-04-16 ENCOUNTER — Other Ambulatory Visit: Payer: Self-pay

## 2021-04-16 ENCOUNTER — Ambulatory Visit: Payer: 59 | Admitting: Physical Therapy

## 2021-04-16 DIAGNOSIS — M25562 Pain in left knee: Secondary | ICD-10-CM

## 2021-04-16 DIAGNOSIS — R262 Difficulty in walking, not elsewhere classified: Secondary | ICD-10-CM

## 2021-04-16 DIAGNOSIS — M6281 Muscle weakness (generalized): Secondary | ICD-10-CM | POA: Diagnosis not present

## 2021-04-16 DIAGNOSIS — G8929 Other chronic pain: Secondary | ICD-10-CM | POA: Diagnosis not present

## 2021-04-16 DIAGNOSIS — M25561 Pain in right knee: Secondary | ICD-10-CM | POA: Diagnosis not present

## 2021-04-16 DIAGNOSIS — R600 Localized edema: Secondary | ICD-10-CM

## 2021-04-16 NOTE — Therapy (Signed)
[facility] [address] [place], [place], [address] Phone: [phone]   Fax:  [phone]  Physical Therapy Treatment  Patient Details  Name: Mackenzie [name] MRN: [id number] Date of Birth: [birth year] Referring Provider (PT): Frankey Shown MD   Encounter Date: 04/16/2021   PT End of Session - 04/16/21 0759     Visit Number 2    Number of Visits 12    Date for PT Re-Evaluation 05/24/21    Authorization Type [employer]    PT Start Time 0802    PT Stop Time 0845    PT Time Calculation (min) 43 min    Activity Tolerance Patient tolerated treatment well    Behavior During Therapy [facility] for tasks assessed/performed             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   Allergic rhinitis 2012   Anemia 01/27/2017   prior to starting chemotherapy   Breast cancer ([place]) 01/14/2017   Left breast   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, left ([place]) 01/23/2017   Edema, lower extremity    Hot flashes 03/2017   Hyperlipidemia 03/20/2016   Morbid obesity with body mass index (BMI) of 40.0 to 49.9 ([place]) 09/17/2017   NCGS (non-celiac gluten sensitivity)    Pre-diabetes 07/26/2018   Hgb A1C elevated on 07/26/2018, Gestational Diabetes 2012   Seasonal allergies 2012   seasonal allergies causes allergic rhinitis and itchy, dry eyes per pt   Vitamin D deficiency 05/2015    Past Surgical History:  Procedure Laterality Date   BREAST RECONSTRUCTION WITH PLACEMENT OF TISSUE EXPANDER AND FLEX HD (ACELLULAR HYDRATED DERMIS) Left 08/27/2017   Procedure: LEFT BREAST RECONSTRUCTION WITH PLACEMENT OF TISSUE EXPANDER AND FLEX HD;  Surgeon: Wallace Going, DO;  Location: [place];  Service: Plastics;  Laterality: Left;   CESAREAN SECTION     x2   LATISSIMUS FLAP TO BREAST Left 11/03/2018   LATISSIMUS FLAP TO BREAST Left 11/03/2018   Procedure: LATISSIMUS FLAP TO LEFT  BREAST;  Surgeon: Wallace Going, DO;  Location: [place];  Service: Plastics;  Laterality: Left;   MASTECTOMY MODIFIED RADICAL Left 08/27/2017   MASTECTOMY MODIFIED RADICAL Left 08/27/2017   Procedure: LEFT MODIFIED RADICAL MASTECTOMY;  Surgeon: Erroll Luna, MD;  Location: [place];  Service: General;  Laterality: Left;   MASTOPEXY Right 03/03/2019   Procedure: RIGHT BREAST MASTOPEXY/REDUCTION;  Surgeon: Wallace Going, DO;  Location: [place];  Service: Plastics;  Laterality: Right;  3 hours, please   PORT-A-CATH REMOVAL Right 08/27/2017   Procedure: REMOVAL PORT-A-CATH RIGHT CHEST;  Surgeon: Erroll Luna, MD;  Location: [place];  Service: General;  Laterality: Right;   PORTACATH PLACEMENT Right 01/28/2017   Procedure: INSERTION PORT-A-CATH WITH ULTRASOUND;  Surgeon: Erroll Luna, MD;  Location[place];  Service: General;  Laterality: Right;   REDUCTION MAMMAPLASTY Right    REMOVAL OF TISSUE EXPANDER AND PLACEMENT OF IMPLANT Left 03/03/2019   Procedure: REMOVAL OF TISSUE EXPANDER AND PLACEMENT OF EXPANDER;  Surgeon: Wallace Going, DO;  Location: [place];  Service: Plastics;  Laterality: Left;   REMOVAL OF TISSUE EXPANDER AND PLACEMENT OF IMPLANT Left 05/25/2019   Procedure: LEFT BREAST REMOVAL OF TISSUE EXPANDER AND PLACEMENT OF IMPLANT;  Surgeon: Wallace Going, DO;  Location: [place];  Service: Plastics;  Laterality: Left;   TISSUE EXPANDER  PLACEMENT Left 11/03/2018   Procedure: PLACEMENT OF TISSUE EXPANDER LEFT BREAST;  Surgeon: Wallace Going, DO;  Location: [place];  Service: Plastics;  Laterality: Left;  Total case time is 3.5 hours   TUBAL LIGATION Bilateral 01/21/2011    There were no vitals filed for this visit.   Subjective Assessment - 04/16/21 0805     Subjective Pt states she has been able to do the exercises once or twice a day. Nothing new to report. Pt states that her L big toe  started to hurt her. Pt reports she is thinking of getting gel injections. Pt is using the app and requests to change number of reps to include time to do the other leg.    Pertinent History h/o lymphedema in left UE, breast CA,    Patient Stated Goals Walk up and down steps without pain    Currently in Pain? 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    Referring Provider (PT) Frankey Shown MD    Hand Dominance Right    Prior Therapy yes for lymphedema                           [facility] Adult PT Treatment/Exercise - 04/16/21 0001       Knee/Hip Exercises: Stretches   Active Hamstring Stretch 1 rep;Both;30 seconds      Knee/Hip Exercises: Aerobic   Nustep L5 x5 min UEs/LEs      Knee/Hip Exercises: Machines for Strengthening   Cybex Knee Extension DL 20# 2x10    Cybex Leg Press DL 75# 2x10      Knee/Hip Exercises: Standing   Lateral Step Up Right;Left;10 reps      Knee/Hip Exercises: Seated   Other Seated Knee/Hip Exercises mini squats in front of chair x 10 with instructions to equal wt shifting between LE's.      Knee/Hip Exercises: Supine   Quad Sets --    Bridges with [employer] Strengthening;Both;Limitations;2 sets;10 reps   5 sec hold   Straight Leg Raises Strengthening;Left;Right;10 reps    Straight Leg Raises Limitations 1 set of 10 with 2#      Knee/Hip Exercises: Sidelying   Clams red tband 2x10      Knee/Hip Exercises: Prone   Hamstring Curl 2 sets;10 reps                       PT Short Term Goals - 04/10/21 0846       PT SHORT TERM GOAL #1   Title Pt will be independent in her initial HEP.    Time 3    Period Weeks    Status New    Target Date 05/03/21      PT SHORT  TERM GOAL #2   Title Pt will be able to perform sit to stand in </= 14 seconds NO UE support.    Time 3    Period Weeks    Status New    Target Date 05/03/21  PT Long Term Goals - 04/10/21 1206       PT LONG TERM GOAL #1   Title Pt will be independent in her advanced HEP.    Baseline -    Time 6    Period Weeks    Status New    Target Date 05/24/21      PT LONG TERM GOAL #2   Title Pt will be able to amb community distances >/= 20 minutes with pain </= 3/10 in bilateral knees.    Baseline -    Time 6    Period Weeks    Status New    Target Date 05/24/21      PT LONG TERM GOAL #3   Title Pt will improve her bilateral knee strength to grossly 5/5 in order to improve functional mobility.    Baseline -    Time 6    Period Weeks    Status New    Target Date 05/24/21      PT LONG TERM GOAL #4   Title Pt will be able to navigate 1 flight stairs with step over step gait pattern with single hand rail with pain </= 3/10 in bilateral knees.    Baseline -    Time 6    Period Weeks    Status New    Target Date 05/24/21      PT LONG TERM GOAL #5   Title Pt will improve her FOTO to >/= 73 %    Baseline 64% on 04/10/2021    Time 6    Period Weeks    Status New    Target Date 05/24/21                   Plan - 04/16/21 0817     Clinical Impression Statement Treatment focused on continuing to strengthen bilat LE. Worked on standing weight shifts and mini squats. Added increased weight. Pt tolerated well.    Personal Factors and Comorbidities Comorbidity 3+    Comorbidities lymphedema, breat cancer, anemia, C-section, mastectomy, breast reconstruction, pre-diabetic    Examination-Activity Limitations Lift;Stand;Stairs;Squat;Transfers    Examination-Participation Restrictions Community Activity;Other;Occupation;Yard Work    Stability/Clinical Decision Making Stable/Uncomplicated    Rehab Potential Good    PT Frequency 2x / week    PT Duration 6  weeks    PT Treatment/Interventions ADLs/Self Care Home Management;Cryotherapy;Balance training;Therapeutic exercise;Therapeutic activities;Functional mobility training;Stair training;Gait training;Neuromuscular re-education;Ultrasound;Patient/family education;Passive range of motion;Manual techniques;Taping;Joint Manipulations;Vasopneumatic Device    PT Next Visit Plan bike vs Nustep, LE strengthening, progress mini squats    PT Home Exercise Plan Access Code: [place] and Agree with Plan of Care Patient             Patient will benefit from skilled therapeutic intervention in order to improve the following deficits and impairments:  Pain, Decreased range of motion, Difficulty walking, Obesity, Decreased activity tolerance, Impaired flexibility, Postural dysfunction, Decreased strength, Decreased mobility, Increased edema  Visit Diagnosis: Chronic pain of right knee  Chronic pain of left knee  Difficulty in walking, not elsewhere classified  Localized edema  Muscle weakness (generalized)     Problem List Patient Active Problem List   Diagnosis Date Noted   Polyphagia 05/16/2020   Vitamin D deficiency 05/16/2020   At risk for osteoporosis 05/16/2020   Prediabetes 05/01/2020   Elevated blood pressure reading 05/01/2020   At risk for impaired metabolic function 41/28/7867   Breast asymmetry following reconstructive surgery 09/20/2019   Benign essential  HTN 08/17/2018   Class 3 severe obesity with serious comorbidity and body mass index (BMI) of 40.0 to 44.9 in adult [facility]) 09/17/2017   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   Malignant neoplasm of upper-inner quadrant of left breast in female, estrogen receptor positive ([place]) 07/27/2017   Genetic testing 03/25/2017   Malignant 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 April Gordy Levan, PT, DPT  04/16/2021, 8:42 AM  [facility] [address] [place], [place], [address] Phone: [phone]   Fax:  [phone]  Name: REXINE GOWENS MRN: [id number] Date of Birth: 21-Jul-1970

## 2021-04-17 ENCOUNTER — Encounter (INDEPENDENT_AMBULATORY_CARE_PROVIDER_SITE_OTHER): Payer: Self-pay | Admitting: Adult Health

## 2021-04-17 ENCOUNTER — Ambulatory Visit (INDEPENDENT_AMBULATORY_CARE_PROVIDER_SITE_OTHER): Payer: 59 | Admitting: Adult Health

## 2021-04-17 VITALS — BP 125/79 | HR 90 | Temp 97.9°F | Ht 66.0 in | Wt 242.0 lb

## 2021-04-17 DIAGNOSIS — Z6841 Body Mass Index (BMI) 40.0 and over, adult: Secondary | ICD-10-CM | POA: Diagnosis not present

## 2021-04-17 DIAGNOSIS — R7303 Prediabetes: Secondary | ICD-10-CM | POA: Diagnosis not present

## 2021-04-17 NOTE — Progress Notes (Signed)
Chief Complaint:   OBESITY Mackenzie [name] is here to discuss her progress with her obesity treatment plan along with follow-up of her obesity related diagnoses. Mackenzie [name] is on the Category 3 Plan and states she is following her eating plan approximately 75% of the time. Mackenzie [name] states she is not exercising at this time due to knee pain.  Today's visit was #: 8 Starting weight: 267 lbs Starting date: 08/09/2019 Today's weight: 242 lbs Today's date: 04/17/2021 Total lbs lost to date: 25 lbs Total lbs lost since last in-office visit: 1 lb  Interim History: Mackenzie [name] endorses increased bilateral knee pain - followed by Mackenzie [name].   She is in outpatient PT 2 days per week.  Of note:  Will establish with personal trainer next week for 1x per week.  Subjective:   1. Pre-diabetes On 03/01/2021, GFR >60, A1c 5.4 - at goal.  She is on metformin 500 mg BID and Ozempic 2 mg. She denies mass in neck, dysphagia, dyspepsia, or persistent hoarseness. Her mother had T2D.  Assessment/Plan:   1. Pre-diabetes Continue metformin and Ozempic.  No need for refill.  2. Obesity: Current BMI 40.4  Mackenzie [name] is currently in the action stage of change. As such, her goal is to continue with weight loss efforts. She has agreed to the Category 3 Plan.   Handout:  Thanksgiving strategies.   Exercise goals:  PT, personal trainer.  Behavioral modification strategies: increasing lean protein intake, decreasing simple carbohydrates, increasing water intake, meal planning and cooking strategies, keeping healthy foods in the home, and planning for success.  Mackenzie [name] has agreed to follow-up with our clinic in 2-3 weeks. She was informed of the importance of frequent follow-up visits to maximize her success with intensive lifestyle modifications for her multiple health conditions.   Objective:   Blood pressure 125/79, pulse 90, temperature 97.9 F (36.6 C), height 5\' 6"  (1.676 m), weight 242 lb (109.8 kg), last  menstrual period 02/10/2017, SpO2 100 %, unknown if currently breastfeeding. Body mass index is 39.06 kg/m.  General: Cooperative, alert, well developed, in no acute distress. HEENT: Conjunctivae and lids unremarkable. Cardiovascular: Regular rhythm.  Lungs: Normal work of breathing. Neurologic: No focal deficits.   Lab Results  Component Value Date   CREATININE 0.88 04/07/2021   BUN 25 (H) 04/07/2021   NA 137 04/07/2021   K 3.7 04/07/2021   CL 103 04/07/2021   CO2 27 04/07/2021   Lab Results  Component Value Date   ALT 17 03/01/2021   AST 19 03/01/2021   GGT 19 03/01/2021   ALKPHOS 107 03/01/2021   BILITOT 0.3 03/01/2021   Lab Results  Component Value Date   HGBA1C 5.4 02/27/2021   HGBA1C 5.4 11/15/2020   HGBA1C 5.1 06/27/2020   HGBA1C 5.6 01/09/2020   HGBA1C 5.9 (H) 08/09/2019   Lab Results  Component Value Date   INSULIN 15.1 02/27/2021   INSULIN 11.7 11/15/2020   INSULIN 13.3 01/11/2020   INSULIN 12.6 08/09/2019   Lab Results  Component Value Date   TSH 0.657 08/09/2019   Lab Results  Component Value Date   CHOL 202 (H) 11/15/2020   HDL 58 11/15/2020   LDLCALC 135 (H) 11/15/2020   TRIG 52 11/15/2020   CHOLHDL 3.5 11/15/2020   Lab Results  Component Value Date   VD25OH 83.0 02/27/2021   VD25OH 74.7 11/15/2020   VD25OH 73.12 06/27/2020   Lab Results  Component Value Date   WBC 4.9 04/07/2021   HGB 11.6 (L) 04/07/2021  HCT 35.5 (L) 04/07/2021   MCV 87.4 04/07/2021   PLT 237 04/07/2021   Attestation Statements:   Reviewed by clinician on day of visit: allergies, medications, problem list, medical history, surgical history, family history, social history, and previous encounter notes.  Time spent on visit including pre-visit chart review and post-visit care and charting was 30 minutes.   I, Water quality scientist, CMA, am acting as Location manager for [name], NP.  I have reviewed the above documentation for accuracy and completeness, and I agree  with the above. -  Kaige Whistler d. Anslie Spadafora, NP-C

## 2021-04-18 ENCOUNTER — Encounter: Payer: Self-pay | Admitting: Physical Therapy

## 2021-04-18 ENCOUNTER — Ambulatory Visit: Payer: 59 | Admitting: Physical Therapy

## 2021-04-18 ENCOUNTER — Other Ambulatory Visit: Payer: Self-pay

## 2021-04-18 DIAGNOSIS — R262 Difficulty in walking, not elsewhere classified: Secondary | ICD-10-CM

## 2021-04-18 DIAGNOSIS — G8929 Other chronic pain: Secondary | ICD-10-CM

## 2021-04-18 DIAGNOSIS — M25562 Pain in left knee: Secondary | ICD-10-CM | POA: Diagnosis not present

## 2021-04-18 DIAGNOSIS — R600 Localized edema: Secondary | ICD-10-CM | POA: Diagnosis not present

## 2021-04-18 DIAGNOSIS — M6281 Muscle weakness (generalized): Secondary | ICD-10-CM | POA: Diagnosis not present

## 2021-04-18 DIAGNOSIS — M25561 Pain in right knee: Secondary | ICD-10-CM

## 2021-04-18 NOTE — Patient Instructions (Signed)
Access Code: Mackenzie [name] URL: [email or web address] Date: 04/18/2021 Prepared by: Faustino Congress  Exercises Supine Bridge with [employer] Between Knees - 2 x daily - 7 x weekly - 2 sets - 10 reps - 5 seconds hold Seated Hamstring Stretch - 2 x daily - 7 x weekly - 6 reps - 30 seconds hold Heel Toe Raises with Unilateral Counter Support - 2 x daily - 7 x weekly - 2 sets - 10 reps Supine Active Straight Leg Raise - 2 x daily - 7 x weekly - 4 sets - 10 reps - 2-3 seconds hold Squat with Chair Touch - 2 x daily - 7 x weekly - 10 reps Clamshell with Resistance - 1 x daily - 7 x weekly - 4 sets - 10 reps Seated Straight Leg Raise - 1 x daily - 7 x weekly - 3 sets - 10 reps Kettlebell Deadlift - 1 x daily - 7 x weekly - 3 sets - 10 reps

## 2021-04-18 NOTE — Therapy (Signed)
[name] [address] [place], [place], [address] Phone: [phone]   Fax:  [phone]  Physical Therapy Treatment  Patient Details  Name: DEUNDRA FURBER MRN: [id number] Date of Birth: 02-22-1971 Referring Provider (PT): Frankey Shown MD   Encounter Date: 04/18/2021   PT End of Session - 04/18/21 1103     Visit Number 3    Number of Visits 12    Date for PT Re-Evaluation 05/24/21    Authorization Type [employer]    PT Start Time [phone]    PT Stop Time 0845    PT Time Calculation (min) 41 min    Activity Tolerance Patient tolerated treatment well    Behavior During Therapy 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, left ([place]) 01/23/2017   Edema, lower extremity    Hot flashes 03/2017   Hyperlipidemia 03/20/2016   Morbid obesity with body mass index (BMI) of 40.0 to 49.9 ([place]) 09/17/2017   NCGS (non-celiac gluten sensitivity)    Pre-diabetes 07/26/2018   Hgb A1C elevated on 07/26/2018, Gestational Diabetes 2012   Seasonal allergies 2012   seasonal allergies causes allergic rhinitis and itchy, dry eyes per pt   Vitamin D deficiency 05/2015    Past Surgical History:  Procedure Laterality Date   BREAST RECONSTRUCTION WITH PLACEMENT OF TISSUE EXPANDER AND FLEX HD (ACELLULAR HYDRATED DERMIS) Left 08/27/2017   Procedure: LEFT BREAST RECONSTRUCTION WITH PLACEMENT OF TISSUE EXPANDER AND FLEX HD;  Surgeon: Wallace Going, DO;  Location: [place];  Service: Plastics;  Laterality: Left;   CESAREAN SECTION     x2   LATISSIMUS FLAP TO BREAST Left 11/03/2018   LATISSIMUS FLAP TO BREAST Left 11/03/2018   Procedure: LATISSIMUS FLAP TO LEFT  BREAST;  Surgeon: Wallace Going, DO;  Location: [place];  Service: Plastics;  Laterality: Left;   MASTECTOMY MODIFIED RADICAL Left 08/27/2017   MASTECTOMY MODIFIED RADICAL Left 08/27/2017   Procedure: LEFT MODIFIED RADICAL MASTECTOMY;  Surgeon: Erroll Luna, MD;  Location: [place];  Service: General;  Laterality: Left;   MASTOPEXY Right 03/03/2019   Procedure: RIGHT BREAST MASTOPEXY/REDUCTION;  Surgeon: Wallace Going, DO;  Location: [place];  Service: Plastics;  Laterality: Right;  3 hours, please   PORT-A-CATH REMOVAL Right 08/27/2017   Procedure: REMOVAL PORT-A-CATH RIGHT CHEST;  Surgeon: Erroll Luna, MD;  Location: [place];  Service: General;  Laterality: Right;   PORTACATH PLACEMENT Right 01/28/2017   Procedure: INSERTION PORT-A-CATH WITH ULTRASOUND;  Surgeon: Erroll Luna, MD;  Location: [place];  Service: General;  Laterality: Right;   REDUCTION MAMMAPLASTY Right    REMOVAL OF TISSUE EXPANDER AND PLACEMENT OF IMPLANT Left 03/03/2019   Procedure: REMOVAL OF TISSUE EXPANDER AND PLACEMENT OF EXPANDER;  Surgeon: Wallace Going, DO;  Location: [place];  Service: Plastics;  Laterality: Left;   REMOVAL OF TISSUE EXPANDER AND PLACEMENT OF IMPLANT Left 05/25/2019   Procedure: LEFT BREAST REMOVAL OF TISSUE EXPANDER AND PLACEMENT OF IMPLANT;  Surgeon: Wallace Going, DO;  Location: [place];  Service: Plastics;  Laterality: Left;   TISSUE EXPANDER  PLACEMENT Left 11/03/2018   Procedure: PLACEMENT OF TISSUE EXPANDER LEFT BREAST;  Surgeon: Wallace Going, DO;  Location: [place];  Service: Plastics;  Laterality: Left;  Total case time is 3.5 hours   TUBAL LIGATION Bilateral 01/21/2011    There were no vitals filed for this visit.   Subjective Assessment - 04/18/21 0804     Subjective sore after last session - pain is still about the same    Pertinent History h/o lymphedema in left UE, breast CA,     Patient Stated Goals Walk up and down steps without pain    Currently in Pain? Yes    Pain Score 5     Pain Location Knee    Pain Orientation Right;Left    Pain Descriptors / Indicators Aching;Sore    Pain Type Chronic pain    Pain Onset More than a month ago    Pain Frequency Intermittent    Aggravating Factors  bending, stairs    Pain Relieving Factors resting, meds                               [facility] Adult PT Treatment/Exercise - 04/18/21 0807       Knee/Hip Exercises: Stretches   Active Hamstring Stretch Both;2 reps;20 seconds      Knee/Hip Exercises: Aerobic   Nustep L5 x 7 min UEs/LEs      Knee/Hip Exercises: Machines for Strengthening   Cybex Knee Extension DL 20# 2x10    Cybex Knee Flexion DL 35# 2x10    Cybex Leg Press DL 75# 2x10; single limb performed bil 37# x15 reps      Knee/Hip Exercises: Standing   Other Standing Knee Exercises RDL 10# KB 2x10      Knee/Hip Exercises: Seated   Other Seated Knee/Hip Exercises seated SLR x10 bil                       PT Short Term Goals - 04/10/21 0846       PT SHORT TERM GOAL #1   Title Pt will be independent in her initial HEP.    Time 3    Period Weeks    Status New    Target Date 05/03/21      PT SHORT TERM GOAL #2   Title Pt will be able to perform sit to stand in </= 14 seconds NO UE support.    Time 3    Period Weeks    Status New    Target Date 05/03/21               PT Long Term Goals - 04/10/21 1206       PT LONG TERM GOAL #1   Title Pt will be independent in her advanced HEP.    Baseline -    Time 6    Period Weeks    Status New    Target Date 05/24/21      PT LONG TERM GOAL #2   Title Pt will be able to amb community distances >/= 20 minutes with pain </= 3/10 in bilateral knees.    Baseline -    Time 6    Period Weeks    Status New    Target Date 05/24/21      PT LONG TERM GOAL #3   Title Pt will improve her bilateral knee strength to grossly  5/5 in order to improve functional  mobility.    Baseline -    Time 6    Period Weeks    Status New    Target Date 05/24/21      PT LONG TERM GOAL #4   Title Pt will be able to navigate 1 flight stairs with step over step gait pattern with single hand rail with pain </= 3/10 in bilateral knees.    Baseline -    Time 6    Period Weeks    Status New    Target Date 05/24/21      PT LONG TERM GOAL #5   Title Pt will improve her FOTO to >/= 73 %    Baseline 64% on 04/10/2021    Time 6    Period Weeks    Status New    Target Date 05/24/21                   Plan - 04/18/21 1103     Clinical Impression Statement Pt tolerated exercises well today without reports of increased pain.  Will continue to benefit from PT to maximize function.  Did discuss aquatics option with either providing exercises to perform on her own or possible transfer to [place]. Will start with HEP for aquatics.    Personal Factors and Comorbidities Comorbidity 3+    Comorbidities lymphedema, breat cancer, anemia, C-section, mastectomy, breast reconstruction, pre-diabetic    Examination-Activity Limitations Lift;Stand;Stairs;Squat;Transfers    Examination-Participation Restrictions Community Activity;Other;Occupation;Yard Work    Stability/Clinical Decision Making Stable/Uncomplicated    Rehab Potential Good    PT Frequency 2x / week    PT Duration 6 weeks    PT Treatment/Interventions ADLs/Self Care Home Management;Cryotherapy;Balance training;Therapeutic exercise;Therapeutic activities;Functional mobility training;Stair training;Gait training;Neuromuscular re-education;Ultrasound;Patient/family education;Passive range of motion;Manual techniques;Taping;Joint Manipulations;Vasopneumatic Device    PT Next Visit Plan bike vs Nustep, LE strengthening, progress mini squats; give aquatics HEP    PT Home Exercise Plan Access Code: [place]    Consulted and Agree with Plan of Care Patient              Patient will benefit from skilled therapeutic intervention in order to improve the following deficits and impairments:  Pain, Decreased range of motion, Difficulty walking, Obesity, Decreased activity tolerance, Impaired flexibility, Postural dysfunction, Decreased strength, Decreased mobility, Increased edema  Visit Diagnosis: Chronic pain of right knee  Chronic pain of left knee  Difficulty in walking, not elsewhere classified  Localized edema  Muscle weakness (generalized)     Problem List Patient Active Problem List   Diagnosis Date Noted   Polyphagia 05/16/2020   Vitamin D deficiency 05/16/2020   At risk for osteoporosis 05/16/2020   Prediabetes 05/01/2020   Elevated blood pressure reading 05/01/2020   At risk for impaired metabolic function 79/89/2119   Breast asymmetry following reconstructive surgery 09/20/2019   Benign essential HTN 08/17/2018   Class 3 severe obesity with serious comorbidity and body mass index (BMI) of 40.0 to 44.9 in adult [facility]) 09/17/2017   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   Malignant neoplasm of upper-inner quadrant of left breast in female, estrogen receptor positive ([place]) 07/27/2017   Genetic testing 03/25/2017   Malignant 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, estrogen receptor positive ([place]) 01/23/2017   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

## 2021-04-22 ENCOUNTER — Encounter: Payer: Self-pay | Admitting: Podiatry

## 2021-04-22 ENCOUNTER — Ambulatory Visit: Payer: 59 | Admitting: Podiatry

## 2021-04-22 ENCOUNTER — Other Ambulatory Visit: Payer: Self-pay

## 2021-04-22 DIAGNOSIS — L600 Ingrowing nail: Secondary | ICD-10-CM | POA: Diagnosis not present

## 2021-04-22 NOTE — Progress Notes (Signed)
Subjective:   Patient ID: Mackenzie [name], female   DOB: 49 y.o.   MRN: [id number]   HPI Patient presents stating the big toenail on her left is increasingly becoming discomforting and that she did have chemo and its been damaged since that on both big toenails but the left 1 is getting very sore neuro   ROS      Objective:  Physical Exam  Vascular status intact with lifted hallux nail left painful when pressed with discomfort into the bed itself secondary to the pressure of the nail with the right hallux being discolored but not having the same pathology     Assessment:  Damaged left hallux nail with pain secondary to structural loss of integrity     Plan:  H&P discussed condition recommended removal explained procedure risk and patient wants surgery.  At this point I went ahead and I infiltrated the left hallux 60 mg like Marcaine mixture sterile prep done and using sterile instrumentation remove the hallux nail exposed matrix applied phenol 5 applications 30 seconds followed by alcohol by sterile dressing and gave instructions on soaks and padding.  Patient will be seen back to recheck is encouraged to call with questions and we will leave dressing on 24 hours but take it off earlier if any throbbing were to occur

## 2021-04-22 NOTE — Patient Instructions (Signed)

## 2021-04-23 ENCOUNTER — Encounter: Payer: Self-pay | Admitting: Physical Therapy

## 2021-04-23 ENCOUNTER — Ambulatory Visit: Payer: 59 | Admitting: Orthopaedic Surgery

## 2021-04-23 ENCOUNTER — Encounter: Payer: Self-pay | Admitting: Orthopaedic Surgery

## 2021-04-23 ENCOUNTER — Ambulatory Visit: Payer: 59 | Admitting: Physical Therapy

## 2021-04-23 DIAGNOSIS — R262 Difficulty in walking, not elsewhere classified: Secondary | ICD-10-CM

## 2021-04-23 DIAGNOSIS — M25561 Pain in right knee: Secondary | ICD-10-CM

## 2021-04-23 DIAGNOSIS — M6281 Muscle weakness (generalized): Secondary | ICD-10-CM | POA: Diagnosis not present

## 2021-04-23 DIAGNOSIS — R600 Localized edema: Secondary | ICD-10-CM

## 2021-04-23 DIAGNOSIS — M25562 Pain in left knee: Secondary | ICD-10-CM | POA: Diagnosis not present

## 2021-04-23 DIAGNOSIS — M170 Bilateral primary osteoarthritis of knee: Secondary | ICD-10-CM | POA: Diagnosis not present

## 2021-04-23 DIAGNOSIS — G8929 Other chronic pain: Secondary | ICD-10-CM

## 2021-04-23 MED ORDER — SODIUM HYALURONATE 60 MG/3ML IX PRSY
60.0000 mg | PREFILLED_SYRINGE | INTRA_ARTICULAR | Status: AC | PRN
  Administered 2021-04-23: 60 mg via INTRA_ARTICULAR

## 2021-04-23 MED ORDER — BUPIVACAINE HCL 0.25 % IJ SOLN
0.6600 mL | INTRAMUSCULAR | Status: AC | PRN
Start: 2021-04-23 — End: 2021-04-23
  Administered 2021-04-23: .66 mL via INTRA_ARTICULAR

## 2021-04-23 MED ORDER — BUPIVACAINE HCL 0.25 % IJ SOLN
0.6600 mL | INTRAMUSCULAR | Status: AC | PRN
  Administered 2021-04-23: .66 mL via INTRA_ARTICULAR

## 2021-04-23 MED ORDER — LIDOCAINE HCL 1 % IJ SOLN
3.0000 mL | INTRAMUSCULAR | Status: AC | PRN
  Administered 2021-04-23: 3 mL

## 2021-04-23 MED ORDER — LIDOCAINE HCL 1 % IJ SOLN
3.0000 mL | INTRAMUSCULAR | Status: AC | PRN
Start: 2021-04-23 — End: 2021-04-23
  Administered 2021-04-23: 3 mL

## 2021-04-23 NOTE — Patient Instructions (Signed)
Access Code: [place] URL: [email or web address] Date: 04/23/2021 Prepared by: Kearney Hard  Exercises Supine Bridge with [employer] Between Knees - 2 x daily - 7 x weekly - 2 sets - 10 reps - 5 seconds hold Seated Hamstring Stretch - 2 x daily - 7 x weekly - 6 reps - 30 seconds hold Heel Toe Raises with Unilateral Counter Support - 2 x daily - 7 x weekly - 2 sets - 10 reps Supine Active Straight Leg Raise - 2 x daily - 7 x weekly - 4 sets - 10 reps - 2-3 seconds hold Squat with Chair Touch - 2 x daily - 7 x weekly - 10 reps Clamshell with Resistance - 1 x daily - 7 x weekly - 4 sets - 10 reps Seated Straight Leg Raise - 1 x daily - 7 x weekly - 3 sets - 10 reps Kettlebell Deadlift - 1 x daily - 7 x weekly - 3 sets - 10 reps Forward and Backward Walking Lunge in [place] Hamstring Stretch with [employer] - 20 seconds hold Side Stepping Standing Hip Abduction Adduction at [employer] - 2 sets - 10 reps Standing Hip Flexion Extension at [employer] - 2 sets - 10 reps Squat - 2 sets - 10 reps

## 2021-04-23 NOTE — Therapy (Signed)
[facility] Physical Therapy [address] [place], [place], [address] Phone: [phone]   Fax:  [phone]  Physical Therapy Treatment  Patient Details  Name: STATIA BURDICK MRN: [id number] Date of Birth: 1970-06-17 Referring Provider (PT): Frankey Shown MD   Encounter Date: 04/23/2021   PT End of Session - 04/23/21 0825     Visit Number 4    Number of Visits 12    Date for PT Re-Evaluation 05/24/21    Authorization Type [employer]    PT Start Time 0803    PT Stop Time [phone]    PT Time Calculation (min) 40 min    Activity Tolerance Patient tolerated treatment well    Behavior During Therapy [facility] for tasks assessed/performed             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   Allergic rhinitis 2012   Anemia 01/27/2017   prior to starting chemotherapy   Breast cancer ([place]) 01/14/2017   Left breast   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, left ([place]) 01/23/2017   Edema, lower extremity    Hot flashes 03/2017   Hyperlipidemia 03/20/2016   Morbid obesity with body mass index (BMI) of 40.0 to 49.9 ([place]) 09/17/2017   NCGS (non-celiac gluten sensitivity)    Pre-diabetes 07/26/2018   Hgb A1C elevated on 07/26/2018, Gestational Diabetes 2012   Seasonal allergies 2012   seasonal allergies causes allergic rhinitis and itchy, dry eyes per pt   Vitamin D deficiency 05/2015    Past Surgical History:  Procedure Laterality Date   BREAST RECONSTRUCTION WITH PLACEMENT OF TISSUE EXPANDER AND FLEX HD (ACELLULAR HYDRATED DERMIS) Left 08/27/2017   Procedure: LEFT BREAST RECONSTRUCTION WITH PLACEMENT OF TISSUE EXPANDER AND FLEX HD;  Surgeon: Wallace Going, DO;  Location: [name];  Service: Plastics;  Laterality: Left;   CESAREAN SECTION     x2   LATISSIMUS FLAP TO BREAST Left 11/03/2018   LATISSIMUS FLAP TO BREAST Left 11/03/2018   Procedure: LATISSIMUS FLAP TO LEFT  BREAST;  Surgeon: Wallace Going, DO;  Location: [place];  Service: Plastics;  Laterality: Left;   MASTECTOMY MODIFIED RADICAL Left 08/27/2017   MASTECTOMY MODIFIED RADICAL Left 08/27/2017   Procedure: LEFT MODIFIED RADICAL MASTECTOMY;  Surgeon: Erroll Luna, MD;  Location: [place];  Service: General;  Laterality: Left;   MASTOPEXY Right 03/03/2019   Procedure: RIGHT BREAST MASTOPEXY/REDUCTION;  Surgeon: Wallace Going, DO;  Location: [place];  Service: Plastics;  Laterality: Right;  3 hours, please   PORT-A-CATH REMOVAL Right 08/27/2017   Procedure: REMOVAL PORT-A-CATH RIGHT CHEST;  Surgeon: Erroll Luna, MD;  Location: [place];  Service: General;  Laterality: Right;   PORTACATH PLACEMENT Right 01/28/2017   Procedure: INSERTION PORT-A-CATH WITH ULTRASOUND;  Surgeon: Erroll Luna, MD;  Location: [place];  Service: General;  Laterality: Right;   REDUCTION MAMMAPLASTY Right    REMOVAL OF TISSUE EXPANDER AND PLACEMENT OF IMPLANT Left 03/03/2019   Procedure: REMOVAL OF TISSUE EXPANDER AND PLACEMENT OF EXPANDER;  Surgeon: Wallace Going, DO;  Location: [place];  Service: Plastics;  Laterality: Left;   REMOVAL OF TISSUE EXPANDER AND PLACEMENT OF IMPLANT Left 05/25/2019   Procedure: LEFT BREAST REMOVAL OF TISSUE EXPANDER AND PLACEMENT OF IMPLANT;  Surgeon: Wallace Going, DO;  Location: [place];  Service: Plastics;  Laterality: Left;   TISSUE EXPANDER  PLACEMENT Left 11/03/2018   Procedure: PLACEMENT OF TISSUE EXPANDER LEFT BREAST;  Surgeon: Wallace Going, DO;  Location: [place];  Service: Plastics;  Laterality: Left;  Total case time is 3.5 hours   TUBAL LIGATION Bilateral 01/21/2011    There were no vitals filed for this visit.   Subjective Assessment - 04/23/21 0823     Subjective Pt reporting improvements since beginning therapy. Pt stating she is having trouble fitting in her HEP in twice a day, but  is consistently doing them once daily.    Pertinent History h/o lymphedema in left UE, breast CA,    Patient Stated Goals Walk up and down steps without pain    Currently in Pain? Yes    Pain Score 3     Pain Location Knee    Pain Orientation Left;Right    Pain Descriptors / Indicators Aching;Sore    Pain Type Chronic pain    Pain Onset More than a month ago                               [facility] Adult PT Treatment/Exercise - 04/23/21 0001       Knee/Hip Exercises: Aerobic   Recumbent Bike L4 x 8 minutes      Knee/Hip Exercises: Machines for Strengthening   Cybex Knee Extension DL 20# 2x10    Cybex Knee Flexion DL 35# 2x10    Cybex Leg Press DL: 87# 2x15 single leg 50# 2x 10      Knee/Hip Exercises: Standing   Other Standing Knee Exercises RDL 10# KB 2x10      Knee/Hip Exercises: Seated   Other Seated Knee/Hip Exercises seated SLR x10 bil                     PT Education - 04/23/21 0840     Education Details added aquatics to pt's HEP for her to work on at the [employer]) Educated Patient    Methods Explanation;Handout;Tactile cues;Verbal cues;Demonstration    Comprehension Verbalized understanding              PT Short Term Goals - 04/23/21 0840       PT SHORT TERM GOAL #1   Title Pt will be independent in her initial HEP.    Status On-going      PT SHORT TERM GOAL #2   Title Pt will be able to perform sit to stand in </= 14 seconds NO UE support.    Status On-going               PT Long Term Goals - 04/23/21 1016       PT LONG TERM GOAL #1   Title Pt will be independent in her advanced HEP.    Status On-going      PT LONG TERM GOAL #2   Title Pt will be able to amb community distances >/= 20 minutes with pain </= 3/10 in bilateral knees.    Status On-going      PT LONG TERM GOAL #3   Title Pt will improve her bilateral knee strength to grossly 5/5 in order to improve functional mobility.    Status On-going       PT LONG TERM GOAL #4   Title Pt will be able to navigate 1 flight stairs with step over step gait pattern with single hand rail with pain </= 3/10 in bilateral  knees.    Status On-going      PT LONG TERM GOAL #5   Title Pt will improve her FOTO to >/= 73 %    Status On-going                   Plan - 04/23/21 0826     Clinical Impression Statement Pt reporting 3/10 pain upon arrival and reporting some improvments in her bilateral knee pain. Pt still reporting trouble after working a full shift and with stair climbing and reporting intermittent pain. Updated HEP with aquatic exercises. Continue skilled PT.    Personal Factors and Comorbidities Comorbidity 3+    Comorbidities lymphedema, breat cancer, anemia, C-section, mastectomy, breast reconstruction, pre-diabetic    Examination-Activity Limitations Lift;Stand;Stairs;Squat;Transfers    Examination-Participation Restrictions Community Activity;Other;Occupation;Yard Work    Rehab Potential Good    PT Frequency 2x / week    PT Duration 6 weeks    PT Treatment/Interventions ADLs/Self Care Home Management;Cryotherapy;Balance training;Therapeutic exercise;Therapeutic activities;Functional mobility training;Stair training;Gait training;Neuromuscular re-education;Ultrasound;Patient/family education;Passive range of motion;Manual techniques;Taping;Joint Manipulations;Vasopneumatic Device    PT Next Visit Plan bike vs Nustep, LE strengthening, progress mini squats; give aquatics HEP    PT Home Exercise Plan Access Code: [place]    Consulted and Agree with Plan of Care Patient             Patient will benefit from skilled therapeutic intervention in order to improve the following deficits and impairments:  Pain, Decreased range of motion, Difficulty walking, Obesity, Decreased activity tolerance, Impaired flexibility, Postural dysfunction, Decreased strength, Decreased mobility, Increased edema  Visit Diagnosis: Chronic pain of  right knee  Chronic pain of left knee  Difficulty in walking, not elsewhere classified  Localized edema  Muscle weakness (generalized)     Problem List Patient Active Problem List   Diagnosis Date Noted   Polyphagia 05/16/2020   Vitamin D deficiency 05/16/2020   At risk for osteoporosis 05/16/2020   Prediabetes 05/01/2020   Elevated blood pressure reading 05/01/2020   At risk for impaired metabolic function 63/87/5643   Breast asymmetry following reconstructive surgery 09/20/2019   Benign essential HTN 08/17/2018   Class 3 severe obesity with serious comorbidity and body mass index (BMI) of 40.0 to 44.9 in adult [facility]) 09/17/2017   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   Malignant neoplasm of upper-inner quadrant of left breast in female, estrogen receptor positive ([place]) 07/27/2017   Genetic testing 03/25/2017   Malignant 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, estrogen receptor positive ([place]) 01/23/2017   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 [place], [place], [address] Phone: [phone]   Fax:  [phone]  Name: ANGE PUSKAS MRN: [id number] Date of Birth: November 29, 1970

## 2021-04-23 NOTE — Progress Notes (Signed)
Office Visit Note   Patient: Mackenzie [name]           Date of Birth: 11-25-1970           MRN: [id number] Visit Date: 04/23/2021              Requested by: [name], MD [address] [place],  [place] [address] PCP: [name], MD   Assessment & Plan: Visit Diagnoses:  1. Bilateral primary osteoarthritis of knee     Plan: Impression is bilateral knee degenerative joint disease.  Today we proceeded with bilateral knee Durolane injections.  She tolerated these well.  She will follow-up with [name] as needed.  Follow-Up Instructions: Return if symptoms worsen or fail to improve.   Orders:  Orders Placed This Encounter  Procedures   Large Joint Inj: bilateral knee   No orders of the defined types were placed in this encounter.     Procedures: Large Joint Inj: bilateral knee on 04/23/2021 3:52 PM Indications: pain Details: 22 G needle, anterolateral approach Medications (Right): 0.66 mL bupivacaine 0.25 %; 3 mL lidocaine 1 %; 60 mg Sodium Hyaluronate 60 MG/3ML Medications (Left): 0.66 mL bupivacaine 0.25 %; 3 mL lidocaine 1 %; 60 mg Sodium Hyaluronate 60 MG/3ML     Clinical Data: No additional findings.   Subjective: Chief Complaint  Patient presents with   Right Knee - Follow-up   Left Knee - Follow-up    HPI patient is a pleasant 50 year old female with underlying bilateral knee DJD who comes in today for Durolane injections to both knees.  She has not undergone viscosupplementation injections in the past.     Objective: Vital Signs: LMP 02/10/2017     Ortho Exam stable bilateral knee exam  Specialty Comments:  No specialty comments available.  Imaging: No new imaging   PMFS History: Patient Active Problem List   Diagnosis Date Noted   Polyphagia 05/16/2020   Vitamin D deficiency 05/16/2020   At risk for osteoporosis 05/16/2020   Prediabetes 05/01/2020   Elevated blood pressure reading 05/01/2020   At risk for  impaired metabolic function 93/26/7124   Breast asymmetry following reconstructive surgery 09/20/2019   Benign essential HTN 08/17/2018   Class 3 severe obesity with serious comorbidity and body mass index (BMI) of 40.0 to 44.9 in adult [facility]) 09/17/2017   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   Malignant neoplasm of upper-inner quadrant of left breast in female, estrogen receptor positive ([place]) 07/27/2017   Genetic testing 03/25/2017   Malignant 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, estrogen receptor positive ([place]) 01/23/2017   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, left ([place]) 01/23/2017   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   Allergic rhinitis 2012   Anemia 01/27/2017   prior to starting chemotherapy   Breast cancer ([place]) 01/14/2017   Left breast   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, left ([place]) 01/23/2017   Edema, lower extremity    Hot flashes 03/2017   Hyperlipidemia 03/20/2016   Morbid obesity with body mass index (BMI) of 40.0 to 49.9 ([place]) 09/17/2017   NCGS (non-celiac gluten sensitivity)    Pre-diabetes 07/26/2018   Hgb A1C elevated on 07/26/2018, Gestational Diabetes 2012   Seasonal allergies 2012   seasonal allergies causes allergic rhinitis and itchy, dry eyes per pt   Vitamin D deficiency 05/2015  Family History  Problem Relation Age of Onset   Other Mother        V.49 from complications of surgery to remove brain tumor   Diabetes Mother    Hypertension Mother    Stroke Mother    Kidney disease Father    Breast cancer Paternal Grandmother 9       d.60s from breast cancer. Did not have treatment.   Hypertension Other    Diabetes Other    Stroke Other     Past Surgical History:  Procedure Laterality Date   BREAST RECONSTRUCTION WITH PLACEMENT OF TISSUE EXPANDER AND FLEX HD (ACELLULAR HYDRATED DERMIS) Left 08/27/2017   Procedure: LEFT  BREAST RECONSTRUCTION WITH PLACEMENT OF TISSUE EXPANDER AND FLEX HD;  Surgeon: Wallace Going, DO;  Location: [place];  Service: Plastics;  Laterality: Left;   CESAREAN SECTION     x2   LATISSIMUS FLAP TO BREAST Left 11/03/2018   LATISSIMUS FLAP TO BREAST Left 11/03/2018   Procedure: LATISSIMUS FLAP TO LEFT BREAST;  Surgeon: Wallace Going, DO;  Location: [place];  Service: Plastics;  Laterality: Left;   MASTECTOMY MODIFIED RADICAL Left 08/27/2017   MASTECTOMY MODIFIED RADICAL Left 08/27/2017   Procedure: LEFT MODIFIED RADICAL MASTECTOMY;  Surgeon: Erroll Luna, MD;  Location: [place];  Service: General;  Laterality: Left;   MASTOPEXY Right 03/03/2019   Procedure: RIGHT BREAST MASTOPEXY/REDUCTION;  Surgeon: Wallace Going, DO;  Location: [place];  Service: Plastics;  Laterality: Right;  3 hours, please   PORT-A-CATH REMOVAL Right 08/27/2017   Procedure: REMOVAL PORT-A-CATH RIGHT CHEST;  Surgeon: Erroll Luna, MD;  Location: [place];  Service: General;  Laterality: Right;   PORTACATH PLACEMENT Right 01/28/2017   Procedure: INSERTION PORT-A-CATH WITH ULTRASOUND;  Surgeon: Erroll Luna, MD;  Location: [place];  Service: General;  Laterality: Right;   REDUCTION MAMMAPLASTY Right    REMOVAL OF TISSUE EXPANDER AND PLACEMENT OF IMPLANT Left 03/03/2019   Procedure: REMOVAL OF TISSUE EXPANDER AND PLACEMENT OF EXPANDER;  Surgeon: Wallace Going, DO;  Location: [place];  Service: Plastics;  Laterality: Left;   REMOVAL OF TISSUE EXPANDER AND PLACEMENT OF IMPLANT Left 05/25/2019   Procedure: LEFT BREAST REMOVAL OF TISSUE EXPANDER AND PLACEMENT OF IMPLANT;  Surgeon: Wallace Going, DO;  Location: [place];  Service: Plastics;  Laterality: Left;   TISSUE EXPANDER PLACEMENT Left 11/03/2018   Procedure: PLACEMENT OF TISSUE EXPANDER LEFT BREAST;  Surgeon: Wallace Going, DO;  Location: [place];  Service: Plastics;   Laterality: Left;  Total case time is 3.5 hours   TUBAL LIGATION Bilateral 01/21/2011   Social History   Occupational History   Occupation: Therapist, sports  Tobacco Use   Smoking status: Never   Smokeless tobacco: Never  Vaping Use   Vaping Use: Never used  Substance and Sexual Activity   Alcohol use: Yes    Comment: social   Drug use: No   Sexual activity: Not on file    Comment: BTL

## 2021-04-25 ENCOUNTER — Ambulatory Visit: Payer: 59

## 2021-04-25 ENCOUNTER — Other Ambulatory Visit: Payer: Self-pay

## 2021-04-25 DIAGNOSIS — R072 Precordial pain: Secondary | ICD-10-CM | POA: Diagnosis not present

## 2021-04-25 DIAGNOSIS — R0609 Other forms of dyspnea: Secondary | ICD-10-CM | POA: Diagnosis not present

## 2021-04-26 ENCOUNTER — Encounter: Payer: 59 | Admitting: Physical Therapy

## 2021-04-26 ENCOUNTER — Other Ambulatory Visit (HOSPITAL_COMMUNITY): Payer: Self-pay

## 2021-04-26 ENCOUNTER — Encounter: Payer: Self-pay | Admitting: Oncology

## 2021-04-26 MED ORDER — AMOXICILLIN 500 MG PO CAPS
500.0000 mg | ORAL_CAPSULE | Freq: Two times a day (BID) | ORAL | 0 refills | Status: DC
  Filled 2021-04-26: qty 20, 10d supply, fill #0

## 2021-04-29 ENCOUNTER — Other Ambulatory Visit: Payer: Self-pay | Admitting: Oncology

## 2021-04-29 ENCOUNTER — Encounter: Payer: Self-pay | Admitting: Oncology

## 2021-04-30 ENCOUNTER — Encounter: Payer: Self-pay | Admitting: Oncology

## 2021-04-30 ENCOUNTER — Other Ambulatory Visit: Payer: Self-pay | Admitting: *Deleted

## 2021-04-30 ENCOUNTER — Other Ambulatory Visit (HOSPITAL_COMMUNITY): Payer: Self-pay

## 2021-04-30 MED ORDER — OMEPRAZOLE 40 MG PO CPDR
40.0000 mg | DELAYED_RELEASE_CAPSULE | Freq: Every day | ORAL | 1 refills | Status: DC
  Filled 2021-04-30: qty 60, 60d supply, fill #0

## 2021-04-30 MED ORDER — OMEPRAZOLE 40 MG PO CPDR
40.0000 mg | DELAYED_RELEASE_CAPSULE | Freq: Every day | ORAL | 1 refills | Status: DC
  Filled 2021-04-30 – 2021-07-30 (×2): qty 90, 90d supply, fill #0
  Filled 2021-11-18: qty 90, 90d supply, fill #1

## 2021-05-01 ENCOUNTER — Ambulatory Visit (HOSPITAL_BASED_OUTPATIENT_CLINIC_OR_DEPARTMENT_OTHER): Payer: 59 | Admitting: General Practice

## 2021-05-06 ENCOUNTER — Ambulatory Visit: Payer: 59 | Admitting: Physical Therapy

## 2021-05-06 ENCOUNTER — Other Ambulatory Visit: Payer: Self-pay

## 2021-05-06 ENCOUNTER — Encounter: Payer: Self-pay | Admitting: Physical Therapy

## 2021-05-06 DIAGNOSIS — R262 Difficulty in walking, not elsewhere classified: Secondary | ICD-10-CM

## 2021-05-06 DIAGNOSIS — M25561 Pain in right knee: Secondary | ICD-10-CM

## 2021-05-06 DIAGNOSIS — R600 Localized edema: Secondary | ICD-10-CM

## 2021-05-06 DIAGNOSIS — G8929 Other chronic pain: Secondary | ICD-10-CM | POA: Diagnosis not present

## 2021-05-06 DIAGNOSIS — M25562 Pain in left knee: Secondary | ICD-10-CM | POA: Diagnosis not present

## 2021-05-06 DIAGNOSIS — M6281 Muscle weakness (generalized): Secondary | ICD-10-CM

## 2021-05-06 NOTE — Therapy (Signed)
[facility] Physical Therapy [address] [place], [place], [address] Phone: [phone]   Fax:  [phone]  Physical Therapy Treatment  Patient Details  Name: Mackenzie [name] MRN: [id number] Date of Birth: Jan 10, 1971 Referring Provider (PT): Frankey Shown MD   Encounter Date: 05/06/2021   PT End of Session - 05/06/21 0856     Visit Number 5    Number of Visits 12    Date for PT Re-Evaluation 05/24/21    Authorization Type [employer]    PT Start Time [phone]    PT Stop Time [phone]    PT Time Calculation (min) 39 min    Activity Tolerance Patient tolerated treatment well    Behavior During Therapy 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, left ([place]) 01/23/2017   Edema, lower extremity    Hot flashes 03/2017   Hyperlipidemia 03/20/2016   Morbid obesity with body mass index (BMI) of 40.0 to 49.9 ([place]) 09/17/2017   NCGS (non-celiac gluten sensitivity)    Pre-diabetes 07/26/2018   Hgb A1C elevated on 07/26/2018, Gestational Diabetes 2012   Seasonal allergies 2012   seasonal allergies causes allergic rhinitis and itchy, dry eyes per pt   Vitamin D deficiency 05/2015    Past Surgical History:  Procedure Laterality Date   BREAST RECONSTRUCTION WITH PLACEMENT OF TISSUE EXPANDER AND FLEX HD (ACELLULAR HYDRATED DERMIS) Left 08/27/2017   Procedure: LEFT BREAST RECONSTRUCTION WITH PLACEMENT OF TISSUE EXPANDER AND FLEX HD;  Surgeon: Wallace Going, DO;  Location: [place];  Service: Plastics;  Laterality: Left;   CESAREAN SECTION     x2   LATISSIMUS FLAP TO BREAST Left 11/03/2018   LATISSIMUS FLAP TO BREAST Left 11/03/2018   Procedure: LATISSIMUS FLAP TO LEFT  BREAST;  Surgeon: Wallace Going, DO;  Location: [place];  Service: Plastics;  Laterality: Left;   MASTECTOMY MODIFIED RADICAL Left 08/27/2017   MASTECTOMY MODIFIED RADICAL Left 08/27/2017   Procedure: LEFT MODIFIED RADICAL MASTECTOMY;  Surgeon: Erroll Luna, MD;  Location: [place];  Service: General;  Laterality: Left;   MASTOPEXY Right 03/03/2019   Procedure: RIGHT BREAST MASTOPEXY/REDUCTION;  Surgeon: Wallace Going, DO;  Location: [place];  Service: Plastics;  Laterality: Right;  3 hours, please   PORT-A-CATH REMOVAL Right 08/27/2017   Procedure: REMOVAL PORT-A-CATH RIGHT CHEST;  Surgeon: Erroll Luna, MD;  Location: [place];  Service: General;  Laterality: Right;   PORTACATH PLACEMENT Right 01/28/2017   Procedure: INSERTION PORT-A-CATH WITH ULTRASOUND;  Surgeon: Erroll Luna, MD;  Location: [place];  Service: General;  Laterality: Right;   REDUCTION MAMMAPLASTY Right    REMOVAL OF TISSUE EXPANDER AND PLACEMENT OF IMPLANT Left 03/03/2019   Procedure: REMOVAL OF TISSUE EXPANDER AND PLACEMENT OF EXPANDER;  Surgeon: Wallace Going, DO;  Location: [place];  Service: Plastics;  Laterality: Left;   REMOVAL OF TISSUE EXPANDER AND PLACEMENT OF IMPLANT Left 05/25/2019   Procedure: LEFT BREAST REMOVAL OF TISSUE EXPANDER AND PLACEMENT OF IMPLANT;  Surgeon: Wallace Going, DO;  Location: [place];  Service: Plastics;  Laterality: Left;   TISSUE EXPANDER  PLACEMENT Left 11/03/2018   Procedure: PLACEMENT OF TISSUE EXPANDER LEFT BREAST;  Surgeon: Wallace Going, DO;  Location: [place];  Service: Plastics;  Laterality: Left;  Total case time is 3.5 hours   TUBAL LIGATION Bilateral 01/21/2011    There were no vitals filed for this visit.   Subjective Assessment - 05/06/21 0818     Subjective had injections a couple of weeks ago - hasn't noticed much of a difference yet.  was a little sick earlier last week so  less activity and then over this past weekend had some episodes of buckling.    Pertinent History h/o lymphedema in left UE, breast CA,    Patient Stated Goals Walk up and down steps without pain    Currently in Pain? Yes    Pain Score 5     Pain Location Knee    Pain Orientation Right;Left    Pain Descriptors / Indicators Aching;Sore    Pain Type Chronic pain    Pain Onset More than a month ago    Pain Frequency Intermittent    Aggravating Factors  bending, stairs    Pain Relieving Factors resting, meds                               [facility] Adult PT Treatment/Exercise - 05/06/21 0820       Knee/Hip Exercises: Stretches   Passive Hamstring Stretch Both;2 reps;20 seconds    Passive Hamstring Stretch Limitations seated      Knee/Hip Exercises: Aerobic   Recumbent Bike L4 x 8 minutes      Knee/Hip Exercises: Machines for Strengthening   Cybex Leg Press DL: 87# 2x15 single leg 50# 2x 15      Knee/Hip Exercises: Seated   Other Seated Knee/Hip Exercises seated SLR 2x10 bil    Sit to Sand 10 reps;without UE support      Knee/Hip Exercises: Supine   Bridges 2 sets;10 reps    Other Supine Knee/Hip Exercises SL clamshell with L4 band in hooklying x 20 reps bil                       PT Short Term Goals - 05/06/21 0857       PT SHORT TERM GOAL #1   Title Pt will be independent in her initial HEP.    Status Achieved      PT SHORT TERM GOAL #2   Title Pt will be able to perform sit to stand in </= 14 seconds NO UE support.    Baseline 11/28: did not formally assess; will check next visit    Status On-going               PT Long Term Goals - 04/23/21 1016       PT LONG TERM GOAL #1   Title Pt will be independent in her advanced HEP.    Status On-going      PT LONG TERM GOAL #2   Title Pt will be able to amb community distances >/= 20 minutes with pain </= 3/10 in bilateral knees.    Status On-going      PT LONG TERM GOAL #3   Title  Pt will improve her bilateral knee strength to grossly 5/5 in order to improve functional mobility.    Status On-going      PT LONG TERM GOAL #4   Title Pt will be able  to navigate 1 flight stairs with step over step gait pattern with single hand rail with pain </= 3/10 in bilateral knees.    Status On-going      PT LONG TERM GOAL #5   Title Pt will improve her FOTO to >/= 73 %    Status On-going                   Plan - 05/06/21 0858     Clinical Impression Statement Pt reports compliance with HEP meeting STG #1, and will assess STS next visit.  Overall progressing well with PT and feel we are nearing transition to community fitness.    Personal Factors and Comorbidities Comorbidity 3+    Comorbidities lymphedema, breat cancer, anemia, C-section, mastectomy, breast reconstruction, pre-diabetic    Examination-Activity Limitations Lift;Stand;Stairs;Squat;Transfers    Examination-Participation Restrictions Community Activity;Other;Occupation;Yard Work    Rehab Potential Good    PT Frequency 2x / week    PT Duration 6 weeks    PT Treatment/Interventions ADLs/Self Care Home Management;Cryotherapy;Balance training;Therapeutic exercise;Therapeutic activities;Functional mobility training;Stair training;Gait training;Neuromuscular re-education;Ultrasound;Patient/family education;Passive range of motion;Manual techniques;Taping;Joint Manipulations;Vasopneumatic Device    PT Next Visit Plan bike/nustep, strengthening; check STS timed goal    PT Home Exercise Plan Access Code: [place]    Consulted and Agree with Plan of Care Patient             Patient will benefit from skilled therapeutic intervention in order to improve the following deficits and impairments:  Pain, Decreased range of motion, Difficulty walking, Obesity, Decreased activity tolerance, Impaired flexibility, Postural dysfunction, Decreased strength, Decreased mobility, Increased edema  Visit Diagnosis: Chronic pain  of right knee  Chronic pain of left knee  Difficulty in walking, not elsewhere classified  Localized edema  Muscle weakness (generalized)     Problem List Patient Active Problem List   Diagnosis Date Noted   Polyphagia 05/16/2020   Vitamin D deficiency 05/16/2020   At risk for osteoporosis 05/16/2020   Prediabetes 05/01/2020   Elevated blood pressure reading 05/01/2020   At risk for impaired metabolic function 09/73/5329   Breast asymmetry following reconstructive surgery 09/20/2019   Benign essential HTN 08/17/2018   Class 3 severe obesity with serious comorbidity and body mass index (BMI) of 40.0 to 44.9 in adult [facility]) 09/17/2017   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   Malignant neoplasm of upper-inner quadrant of left breast in female, estrogen receptor positive ([place]) 07/27/2017   Genetic testing 03/25/2017   Malignant 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, estrogen receptor positive ([place]) 01/23/2017   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

## 2021-05-07 ENCOUNTER — Other Ambulatory Visit (HOSPITAL_COMMUNITY): Payer: Self-pay

## 2021-05-07 ENCOUNTER — Encounter (INDEPENDENT_AMBULATORY_CARE_PROVIDER_SITE_OTHER): Payer: Self-pay | Admitting: Adult Health

## 2021-05-07 ENCOUNTER — Ambulatory Visit (INDEPENDENT_AMBULATORY_CARE_PROVIDER_SITE_OTHER): Payer: 59 | Admitting: Adult Health

## 2021-05-07 VITALS — BP 132/77 | HR 101 | Temp 98.2°F | Ht 65.0 in | Wt 237.0 lb

## 2021-05-07 DIAGNOSIS — E66813 Obesity, class 3: Secondary | ICD-10-CM

## 2021-05-07 DIAGNOSIS — Z9189 Other specified personal risk factors, not elsewhere classified: Secondary | ICD-10-CM | POA: Diagnosis not present

## 2021-05-07 DIAGNOSIS — Z6841 Body Mass Index (BMI) 40.0 and over, adult: Secondary | ICD-10-CM | POA: Diagnosis not present

## 2021-05-07 DIAGNOSIS — R7303 Prediabetes: Secondary | ICD-10-CM

## 2021-05-07 MED ORDER — METFORMIN HCL 500 MG PO TABS
500.0000 mg | ORAL_TABLET | Freq: Two times a day (BID) | ORAL | 0 refills | Status: DC
  Filled 2021-05-07 – 2021-05-16 (×2): qty 180, 90d supply, fill #0

## 2021-05-07 NOTE — Progress Notes (Signed)
Chief Complaint:   OBESITY Mackenzie [name] is here to discuss her progress with her obesity treatment plan along with follow-up of her obesity related diagnoses. Mackenzie [name] is on the Category 3 Plan and states she is following her eating plan approximately 70% of the time. Mackenzie [name] states she is not exercising regularly at this time.  Today's visit was #: 48 Starting weight: 267 lbs Starting date: 08/09/2019 Today's weight: 237 lbs Today's date: 05/07/2021 Total lbs lost to date: 30 lbs Total lbs lost since last in-office visit: 5 lbs  Interim History:  Mackenzie [name] was able to utilize mindful eating habits during Thanksgiving:   She enjoyed:ham, [place], ribs, green beans, collard greens. Acutely ill the week prior to Thanksgiving, COVID-19 negative, "was just a cold". She denies any current acute sx's at present.  Subjective:   1. Pre-diabetes She is on metformin 500 mg BID and the maximum dose of Ozempic 2 mg - injects on Sunday. She denies mass in neck, dysphagia, dyspepsia, persistent hoarseness or GI upset.  2. At risk for diarrhea Mackenzie [name] is at risk for diarrhea due to taking metformin for prediabetes.  Assessment/Plan:   1. Pre-diabetes Refill metformin 500 mg BID with meals, as per below.  - Refill metFORMIN (GLUCOPHAGE) 500 MG tablet; Take 1 tablet (500 mg total) by mouth 2 (two) times daily with a meal.  Dispense: 180 tablet; Refill: 0  Continue Ozempic  2mg  once weekly- does not require RF today.  2. At risk for diarrhea Mackenzie [name] was given approximately 15 minutes of diarrhea prevention counseling today. She is 50 y.o. female and has risk factors for diarrhea including medications and changes in diet. We discussed intensive lifestyle modifications today with an emphasis on specific weight loss instructions including dietary strategies.   Repetitive spaced learning was employed today to elicit superior memory formation and behavioral change.  3. Obesity: Current BMI  39.5  Mackenzie [name] is currently in the action stage of change. As such, her goal is to continue with weight loss efforts. She has agreed to the Category 3 Plan.   Exercise goals:  when not working, walk at least 58mins/day.  Behavioral modification strategies: increasing lean protein intake, decreasing simple carbohydrates, increasing water intake, meal planning and cooking strategies, keeping healthy foods in the home, and planning for success.  Mackenzie [name] has agreed to follow-up with our clinic in 2-3 weeks. She was informed of the importance of frequent follow-up visits to maximize her success with intensive lifestyle modifications for her multiple health conditions.   Objective:   Blood pressure 132/77, pulse (!) 101, temperature 98.2 F (36.8 C), height 5\' 5"  (1.651 m), weight 237 lb (107.5 kg), last menstrual period 02/10/2017, SpO2 100 %, unknown if currently breastfeeding. Body mass index is 39.44 kg/m.  General: Cooperative, alert, well developed, in no acute distress. HEENT: Conjunctivae and lids unremarkable. Cardiovascular: Regular rhythm.  Lungs: Normal work of breathing. Neurologic: No focal deficits.   Lab Results  Component Value Date   CREATININE 0.88 04/07/2021   BUN 25 (H) 04/07/2021   NA 137 04/07/2021   K 3.7 04/07/2021   CL 103 04/07/2021   CO2 27 04/07/2021   Lab Results  Component Value Date   ALT 17 03/01/2021   AST 19 03/01/2021   GGT 19 03/01/2021   ALKPHOS 107 03/01/2021   BILITOT 0.3 03/01/2021   Lab Results  Component Value Date   HGBA1C 5.4 02/27/2021   HGBA1C 5.4 11/15/2020   HGBA1C 5.1 06/27/2020   HGBA1C 5.6  01/09/2020   HGBA1C 5.9 (H) 08/09/2019   Lab Results  Component Value Date   INSULIN 15.1 02/27/2021   INSULIN 11.7 11/15/2020   INSULIN 13.3 01/11/2020   INSULIN 12.6 08/09/2019   Lab Results  Component Value Date   TSH 0.657 08/09/2019   Lab Results  Component Value Date   CHOL 202 (H) 11/15/2020   HDL 58 11/15/2020    LDLCALC 135 (H) 11/15/2020   TRIG 52 11/15/2020   CHOLHDL 3.5 11/15/2020   Lab Results  Component Value Date   VD25OH 83.0 02/27/2021   VD25OH 74.7 11/15/2020   VD25OH 73.12 06/27/2020   Lab Results  Component Value Date   WBC 4.9 04/07/2021   HGB 11.6 (L) 04/07/2021   HCT 35.5 (L) 04/07/2021   MCV 87.4 04/07/2021   PLT 237 04/07/2021   Attestation Statements:   Reviewed by clinician on day of visit: allergies, medications, problem list, medical history, surgical history, family history, social history, and previous encounter notes.  I, Water quality scientist, CMA, am acting as Location manager for [name], NP.  I have reviewed the above documentation for accuracy and completeness, and I agree with the above. -  Skie Vitrano d. Olympia Adelsberger, NP-C

## 2021-05-08 ENCOUNTER — Encounter: Payer: Self-pay | Admitting: Physical Therapy

## 2021-05-08 ENCOUNTER — Ambulatory Visit: Payer: 59 | Admitting: Physical Therapy

## 2021-05-08 ENCOUNTER — Other Ambulatory Visit: Payer: Self-pay

## 2021-05-08 DIAGNOSIS — R600 Localized edema: Secondary | ICD-10-CM | POA: Diagnosis not present

## 2021-05-08 DIAGNOSIS — R262 Difficulty in walking, not elsewhere classified: Secondary | ICD-10-CM | POA: Diagnosis not present

## 2021-05-08 DIAGNOSIS — M6281 Muscle weakness (generalized): Secondary | ICD-10-CM

## 2021-05-08 DIAGNOSIS — G8929 Other chronic pain: Secondary | ICD-10-CM

## 2021-05-08 DIAGNOSIS — M25561 Pain in right knee: Secondary | ICD-10-CM

## 2021-05-08 DIAGNOSIS — M25562 Pain in left knee: Secondary | ICD-10-CM | POA: Diagnosis not present

## 2021-05-08 NOTE — Therapy (Signed)
[facility] [address] [place], [place], [address] Phone: [phone]   Fax:  [phone]  Physical Therapy Treatment  Patient Details  Name: Mackenzie [name] MRN: [id number] Date of Birth: 1971/05/07 Referring Provider (PT): Frankey Shown MD   Encounter Date: 05/08/2021   PT End of Session - 05/08/21 0916     Visit Number 6    Number of Visits 12    Date for PT Re-Evaluation 05/24/21    Authorization Type [employer]    PT Start Time [id number]   pt arrived late   PT Stop Time 0844    PT Time Calculation (min) 35 min    Activity Tolerance Patient tolerated treatment well    Behavior During Therapy [facility] for tasks assessed/performed             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   Allergic rhinitis 2012   Anemia 01/27/2017   prior to starting chemotherapy   Breast cancer ([place]) 01/14/2017   Left breast   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, left ([place]) 01/23/2017   Edema, lower extremity    Hot flashes 03/2017   Hyperlipidemia 03/20/2016   Morbid obesity with body mass index (BMI) of 40.0 to 49.9 ([place]) 09/17/2017   NCGS (non-celiac gluten sensitivity)    Pre-diabetes 07/26/2018   Hgb A1C elevated on 07/26/2018, Gestational Diabetes 2012   Seasonal allergies 2012   seasonal allergies causes allergic rhinitis and itchy, dry eyes per pt   Vitamin D deficiency 05/2015    Past Surgical History:  Procedure Laterality Date   BREAST RECONSTRUCTION WITH PLACEMENT OF TISSUE EXPANDER AND FLEX HD (ACELLULAR HYDRATED DERMIS) Left 08/27/2017   Procedure: LEFT BREAST RECONSTRUCTION WITH PLACEMENT OF TISSUE EXPANDER AND FLEX HD;  Surgeon: Wallace Going, DO;  Location: [place];  Service: Plastics;  Laterality: Left;   CESAREAN SECTION     x2   LATISSIMUS FLAP TO BREAST Left 11/03/2018   LATISSIMUS FLAP TO BREAST Left 11/03/2018   Procedure:  LATISSIMUS FLAP TO LEFT BREAST;  Surgeon: Wallace Going, DO;  Location: [place];  Service: Plastics;  Laterality: Left;   MASTECTOMY MODIFIED RADICAL Left 08/27/2017   MASTECTOMY MODIFIED RADICAL Left 08/27/2017   Procedure: LEFT MODIFIED RADICAL MASTECTOMY;  Surgeon: Erroll Luna, MD;  Location: [place];  Service: General;  Laterality: Left;   MASTOPEXY Right 03/03/2019   Procedure: RIGHT BREAST MASTOPEXY/REDUCTION;  Surgeon: Wallace Going, DO;  Location: [place];  Service: Plastics;  Laterality: Right;  3 hours, please   PORT-A-CATH REMOVAL Right 08/27/2017   Procedure: REMOVAL PORT-A-CATH RIGHT CHEST;  Surgeon: Erroll Luna, MD;  Location: [place];  Service: General;  Laterality: Right;   PORTACATH PLACEMENT Right 01/28/2017   Procedure: INSERTION PORT-A-CATH WITH ULTRASOUND;  Surgeon: Erroll Luna, MD;  Location: [place];  Service: General;  Laterality: Right;   REDUCTION MAMMAPLASTY Right    REMOVAL OF TISSUE EXPANDER AND PLACEMENT OF IMPLANT Left 03/03/2019   Procedure: REMOVAL OF TISSUE EXPANDER AND PLACEMENT OF EXPANDER;  Surgeon: Wallace Going, DO;  Location: [place];  Service: Plastics;  Laterality: Left;   REMOVAL OF TISSUE EXPANDER AND PLACEMENT OF IMPLANT Left 05/25/2019   Procedure: LEFT BREAST REMOVAL OF TISSUE EXPANDER AND PLACEMENT OF IMPLANT;  Surgeon: Wallace Going, DO;  Location: [place];  Service: Plastics;  Laterality: Left;  TISSUE EXPANDER PLACEMENT Left 11/03/2018   Procedure: PLACEMENT OF TISSUE EXPANDER LEFT BREAST;  Surgeon: Wallace Going, DO;  Location: [place];  Service: Plastics;  Laterality: Left;  Total case time is 3.5 hours   TUBAL LIGATION Bilateral 01/21/2011    There were no vitals filed for this visit.   Subjective Assessment - 05/08/21 0812     Subjective knees are doing pretty well, Lt hip seems to be hurting more but seems to be improved today     Pertinent History h/o lymphedema in left UE, breast CA,    Patient Stated Goals Walk up and down steps without pain    Currently in Pain? 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    Status Achieved      PT SHORT TERM GOAL #2   Title Pt will be able to perform sit to stand in </= 14 seconds NO UE support.    Baseline 11/30:15.91 sec    Status Not Met               PT Long Term Goals - 04/23/21 1016       PT LONG TERM GOAL #1   Title Pt will be independent in her advanced HEP.    Status On-going      PT LONG TERM GOAL #2   Title Pt will be able to amb community distances >/= 20 minutes with pain </= 3/10 in bilateral knees.    Status On-going      PT LONG TERM GOAL #3   Title Pt will improve her bilateral knee strength to grossly 5/5 in order to improve functional mobility.    Status On-going      PT LONG TERM GOAL #4   Title Pt will be able to navigate 1 flight stairs with step over step gait pattern with single hand rail with pain </= 3/10 in bilateral knees.    Status On-going      PT LONG TERM GOAL #5   Title Pt will improve her FOTO to >/= 73 %    Status On-going                   Plan - 05/08/21 0917     Clinical Impression Statement Pt has demonstrated improvement in 5x STS but not quite to STG at  this time.  She is progressing well with PT reporting  decreased pain today.  Will continue to benefit from PT to maximize function.    Personal Factors and Comorbidities Comorbidity 3+    Comorbidities lymphedema, breat cancer, anemia, C-section, mastectomy, breast reconstruction, pre-diabetic    Examination-Activity Limitations Lift;Stand;Stairs;Squat;Transfers    Examination-Participation Restrictions Community Activity;Other;Occupation;Yard Work    Rehab Potential Good    PT Frequency 2x / week    PT Duration 6 weeks    PT Treatment/Interventions ADLs/Self Care Home Management;Cryotherapy;Balance training;Therapeutic exercise;Therapeutic activities;Functional mobility training;Stair training;Gait training;Neuromuscular re-education;Ultrasound;Patient/family education;Passive range of motion;Manual techniques;Taping;Joint Manipulations;Vasopneumatic Device    PT Next Visit Plan bike/nustep, strengthening; FOTO update    PT Home Exercise Plan Access Code: [email or web address]    Consulted and Agree with Plan of Care Patient             Patient will benefit from skilled therapeutic intervention in order to improve the following deficits and impairments:  Pain, Decreased range of motion, Difficulty walking, Obesity, Decreased activity tolerance, Impaired flexibility, Postural dysfunction, Decreased strength, Decreased mobility, Increased edema  Visit Diagnosis: Chronic pain of right knee  Chronic pain of left knee  Difficulty in walking, not elsewhere classified  Localized edema  Muscle weakness (generalized)     Problem List Patient Active Problem List   Diagnosis Date Noted   Polyphagia 05/16/2020   Vitamin D deficiency 05/16/2020   At risk for osteoporosis 05/16/2020   Prediabetes 05/01/2020   Elevated blood pressure reading 05/01/2020   At risk for impaired metabolic function 32/91/9166   Breast asymmetry following reconstructive surgery 09/20/2019   Benign essential HTN  08/17/2018   Class 3 severe obesity with serious comorbidity and body mass index (BMI) of 40.0 to 44.9 in adult [facility]) 09/17/2017   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   Malignant neoplasm of upper-inner quadrant of left breast in female, estrogen receptor positive ([place]) 07/27/2017   Genetic testing 03/25/2017   Malignant 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, estrogen receptor positive ([place]) 01/23/2017   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 [place], [place], [address] Phone: [phone]   Fax:  [phone]  Name: Mackenzie [name] MRN: [id number] Date of Birth: May 15, 1971

## 2021-05-09 ENCOUNTER — Telehealth: Payer: Self-pay | Admitting: Cardiology

## 2021-05-09 NOTE — Telephone Encounter (Signed)
Lvm to r/s test

## 2021-05-14 ENCOUNTER — Encounter: Payer: Self-pay | Admitting: Physical Therapy

## 2021-05-14 ENCOUNTER — Other Ambulatory Visit: Payer: Self-pay

## 2021-05-14 ENCOUNTER — Ambulatory Visit: Payer: 59 | Admitting: Physical Therapy

## 2021-05-14 DIAGNOSIS — M25561 Pain in right knee: Secondary | ICD-10-CM

## 2021-05-14 DIAGNOSIS — M6281 Muscle weakness (generalized): Secondary | ICD-10-CM | POA: Diagnosis not present

## 2021-05-14 DIAGNOSIS — R600 Localized edema: Secondary | ICD-10-CM | POA: Diagnosis not present

## 2021-05-14 DIAGNOSIS — M25562 Pain in left knee: Secondary | ICD-10-CM

## 2021-05-14 DIAGNOSIS — R262 Difficulty in walking, not elsewhere classified: Secondary | ICD-10-CM

## 2021-05-14 DIAGNOSIS — G8929 Other chronic pain: Secondary | ICD-10-CM | POA: Diagnosis not present

## 2021-05-14 NOTE — Therapy (Signed)
[facility] Physical Therapy [address] [place], [place], [address] Phone: [phone]   Fax:  [phone]  Physical Therapy Treatment  Patient Details  Name: Mackenzie [name] MRN: [id number] Date of Birth: 11-14-1970 Referring Provider (PT): Frankey Shown MD   Encounter Date: 05/14/2021   PT End of Session - 05/14/21 0820     Visit Number 7    Number of Visits 12    Date for PT Re-Evaluation 05/24/21    Authorization Type [employer]    PT Start Time 0802    PT Stop Time 0840    PT Time Calculation (min) 38 min    Activity Tolerance Patient tolerated treatment well    Behavior During Therapy [facility] for tasks assessed/performed             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   Allergic rhinitis 2012   Anemia 01/27/2017   prior to starting chemotherapy   Breast cancer ([place]) 01/14/2017   Left breast   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, left ([place]) 01/23/2017   Edema, lower extremity    Hot flashes 03/2017   Hyperlipidemia 03/20/2016   Morbid obesity with body mass index (BMI) of 40.0 to 49.9 ([place]) 09/17/2017   NCGS (non-celiac gluten sensitivity)    Pre-diabetes 07/26/2018   Hgb A1C elevated on 07/26/2018, Gestational Diabetes 2012   Seasonal allergies 2012   seasonal allergies causes allergic rhinitis and itchy, dry eyes per pt   Vitamin D deficiency 05/2015    Past Surgical History:  Procedure Laterality Date   BREAST RECONSTRUCTION WITH PLACEMENT OF TISSUE EXPANDER AND FLEX HD (ACELLULAR HYDRATED DERMIS) Left 08/27/2017   Procedure: LEFT BREAST RECONSTRUCTION WITH PLACEMENT OF TISSUE EXPANDER AND FLEX HD;  Surgeon: Wallace Going, DO;  Location: [place];  Service: Plastics;  Laterality: Left;   CESAREAN SECTION     x2   LATISSIMUS FLAP TO BREAST Left 11/03/2018   LATISSIMUS FLAP TO BREAST Left 11/03/2018   Procedure: LATISSIMUS FLAP TO LEFT  BREAST;  Surgeon: Wallace Going, DO;  Location: [place];  Service: Plastics;  Laterality: Left;   MASTECTOMY MODIFIED RADICAL Left 08/27/2017   MASTECTOMY MODIFIED RADICAL Left 08/27/2017   Procedure: LEFT MODIFIED RADICAL MASTECTOMY;  Surgeon: Erroll Luna, MD;  Location: [place];  Service: General;  Laterality: Left;   MASTOPEXY Right 03/03/2019   Procedure: RIGHT BREAST MASTOPEXY/REDUCTION;  Surgeon: Wallace Going, DO;  Location: [place];  Service: Plastics;  Laterality: Right;  3 hours, please   PORT-A-CATH REMOVAL Right 08/27/2017   Procedure: REMOVAL PORT-A-CATH RIGHT CHEST;  Surgeon: Erroll Luna, MD;  Location: [place];  Service: General;  Laterality: Right;   PORTACATH PLACEMENT Right 01/28/2017   Procedure: INSERTION PORT-A-CATH WITH ULTRASOUND;  Surgeon: Erroll Luna, MD;  Location: [place];  Service: General;  Laterality: Right;   REDUCTION MAMMAPLASTY Right    REMOVAL OF TISSUE EXPANDER AND PLACEMENT OF IMPLANT Left 03/03/2019   Procedure: REMOVAL OF TISSUE EXPANDER AND PLACEMENT OF EXPANDER;  Surgeon: Wallace Going, DO;  Location: [place];  Service: Plastics;  Laterality: Left;   REMOVAL OF TISSUE EXPANDER AND PLACEMENT OF IMPLANT Left 05/25/2019   Procedure: LEFT BREAST REMOVAL OF TISSUE EXPANDER AND PLACEMENT OF IMPLANT;  Surgeon: Wallace Going, DO;  Location: [place];  Service: Plastics;  Laterality: Left;   TISSUE EXPANDER  PLACEMENT Left 11/03/2018   Procedure: PLACEMENT OF TISSUE EXPANDER LEFT BREAST;  Surgeon: Wallace Going, DO;  Location: [place];  Service: Plastics;  Laterality: Left;  Total case time is 3.5 hours   TUBAL LIGATION Bilateral 01/21/2011    There were no vitals filed for this visit.   Subjective Assessment - 05/14/21 0810     Subjective Pt reporting 4/10 knee pain this morning. Right > Left    Pertinent History h/o lymphedema in left UE, breast CA,     Patient Stated Goals Walk up and down steps without pain    Currently in Pain? 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    Referring Provider (PT) Frankey Shown MD      Observation/Other Assessments   Focus on Therapeutic Outcomes (FOTO)  63%                           [facility] Adult PT Treatment/Exercise - 05/14/21 0001       Transfers   Five time sit to stand comments  12 seconds no UE support      Neuro Re-ed    Neuro Re-ed Details  [employer]: standing feet togehter, feet apart, SLS on level ground, side stepping, stepping over cones      Knee/Hip Exercises: Aerobic   Recumbent Bike L4 x 6 minutes      Knee/Hip Exercises: Machines for Strengthening   Cybex Knee Extension DL 20# 2x10    Cybex Knee Flexion DL 35# 2x10      Knee/Hip Exercises: Standing   Step Down Both;10 reps;Hand Hold: 2;Step Height: 4"                       PT Short Term Goals - 05/14/21 0923       PT SHORT TERM GOAL #1   Title Pt will be independent in her initial HEP.      PT SHORT TERM GOAL #2   Title Pt will be able to perform sit to stand in </= 14 seconds NO UE support.    Baseline 12 seconds on 05/14/2021    Status Achieved               PT Long Term Goals - 04/23/21 1016       PT LONG TERM GOAL #1   Title Pt will be independent in her advanced HEP.    Status On-going      PT LONG TERM GOAL #2   Title Pt will be able to amb community distances >/= 20 minutes with pain </= 3/10 in bilateral knees.    Status On-going      PT LONG TERM GOAL #3   Title Pt will improve her bilateral knee strength to grossly 5/5 in order to improve functional mobility.    Status On-going      PT  LONG TERM GOAL #4   Title Pt will be able to navigate 1 flight stairs with step over step gait  pattern with single hand rail with pain </= 3/10 in bilateral knees.    Status On-going      PT LONG TERM GOAL #5   Title Pt will improve her FOTO to >/= 73 %    Status On-going                   Plan - 05/14/21 7416     Clinical Impression Statement Pt arriving today with 4/10 pain in bilateral knees with her right knee worse. Treatment focused on overall strengthening and dynamic balance.Pt was shown STM using a percussor device.  Continue skilled PT to maximize function.    Personal Factors and Comorbidities Comorbidity 3+    Comorbidities lymphedema, breat cancer, anemia, C-section, mastectomy, breast reconstruction, pre-diabetic    Examination-Participation Restrictions Community Activity;Other;Occupation;Yard Work    Stability/Clinical Decision Making Stable/Uncomplicated    Rehab Potential Good    PT Frequency 2x / week    PT Duration 6 weeks    PT Treatment/Interventions ADLs/Self Care Home Management;Cryotherapy;Balance training;Therapeutic exercise;Therapeutic activities;Functional mobility training;Stair training;Gait training;Neuromuscular re-education;Ultrasound;Patient/family education;Passive range of motion;Manual techniques;Taping;Joint Manipulations;Vasopneumatic Device    PT Next Visit Plan bike/nustep, strengthening; dynamic balance    PT Home Exercise Plan Access Code: [place]    Consulted and Agree with Plan of Care Patient             Patient will benefit from skilled therapeutic intervention in order to improve the following deficits and impairments:  Pain, Decreased range of motion, Difficulty walking, Obesity, Decreased activity tolerance, Impaired flexibility, Postural dysfunction, Decreased strength, Decreased mobility, Increased edema  Visit Diagnosis: Chronic pain of right knee  Chronic pain of left knee  Difficulty in walking, not elsewhere  classified  Localized edema  Muscle weakness (generalized)     Problem List Patient Active Problem List   Diagnosis Date Noted   Polyphagia 05/16/2020   Vitamin D deficiency 05/16/2020   At risk for osteoporosis 05/16/2020   Prediabetes 05/01/2020   Elevated blood pressure reading 05/01/2020   At risk for impaired metabolic function 38/45/3646   Breast asymmetry following reconstructive surgery 09/20/2019   Benign essential HTN 08/17/2018   Class 3 severe obesity with serious comorbidity and body mass index (BMI) of 40.0 to 44.9 in adult [facility]) 09/17/2017   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   Malignant neoplasm of upper-inner quadrant of left breast in female, estrogen receptor positive ([place]) 07/27/2017   Genetic testing 03/25/2017   Malignant 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, estrogen receptor positive ([place]) 01/23/2017   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 [place], [place], [address] Phone: [phone]   Fax:  [phone]  Name: Mackenzie [name] MRN: [id number] Date of Birth: 03-14-1971

## 2021-05-15 ENCOUNTER — Other Ambulatory Visit (HOSPITAL_COMMUNITY): Payer: Self-pay

## 2021-05-16 ENCOUNTER — Encounter: Payer: 59 | Admitting: Physical Therapy

## 2021-05-16 ENCOUNTER — Other Ambulatory Visit (HOSPITAL_COMMUNITY): Payer: Self-pay

## 2021-05-21 ENCOUNTER — Other Ambulatory Visit: Payer: Self-pay

## 2021-05-21 ENCOUNTER — Ambulatory Visit: Payer: 59 | Admitting: Physical Therapy

## 2021-05-21 ENCOUNTER — Encounter: Payer: Self-pay | Admitting: Physical Therapy

## 2021-05-21 DIAGNOSIS — M25561 Pain in right knee: Secondary | ICD-10-CM

## 2021-05-21 DIAGNOSIS — R262 Difficulty in walking, not elsewhere classified: Secondary | ICD-10-CM

## 2021-05-21 DIAGNOSIS — R600 Localized edema: Secondary | ICD-10-CM | POA: Diagnosis not present

## 2021-05-21 DIAGNOSIS — M25562 Pain in left knee: Secondary | ICD-10-CM

## 2021-05-21 DIAGNOSIS — M6281 Muscle weakness (generalized): Secondary | ICD-10-CM | POA: Diagnosis not present

## 2021-05-21 DIAGNOSIS — G8929 Other chronic pain: Secondary | ICD-10-CM

## 2021-05-21 NOTE — Therapy (Signed)
[facility] [address] [place], [place], [address] Phone: [phone]   Fax:  [phone]  Physical Therapy Treatment  Patient Details  Name: Mackenzie [name] MRN: [id number] Date of Birth: 02/02/1971 Referring Provider (PT): Frankey Shown MD   Encounter Date: 05/21/2021   PT End of Session - 05/21/21 0848     Visit Number 8    Number of Visits 12    Date for PT Re-Evaluation 05/24/21    Authorization Type [employer]    PT Start Time [phone]    PT Stop Time 0845    PT Time Calculation (min) 35 min    Activity Tolerance Patient tolerated treatment well    Behavior During Therapy 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, left ([place]) 01/23/2017   Edema, lower extremity    Hot flashes 03/2017   Hyperlipidemia 03/20/2016   Morbid obesity with body mass index (BMI) of 40.0 to 49.9 ([place]) 09/17/2017   NCGS (non-celiac gluten sensitivity)    Pre-diabetes 07/26/2018   Hgb A1C elevated on 07/26/2018, Gestational Diabetes 2012   Seasonal allergies 2012   seasonal allergies causes allergic rhinitis and itchy, dry eyes per pt   Vitamin D deficiency 05/2015    Past Surgical History:  Procedure Laterality Date   BREAST RECONSTRUCTION WITH PLACEMENT OF TISSUE EXPANDER AND FLEX HD (ACELLULAR HYDRATED DERMIS) Left 08/27/2017   Procedure: LEFT BREAST RECONSTRUCTION WITH PLACEMENT OF TISSUE EXPANDER AND FLEX HD;  Surgeon: Wallace Going, DO;  Location: [place];  Service: Plastics;  Laterality: Left;   CESAREAN SECTION     x2   LATISSIMUS FLAP TO BREAST Left 11/03/2018   LATISSIMUS FLAP TO BREAST Left 11/03/2018   Procedure: LATISSIMUS FLAP TO LEFT  BREAST;  Surgeon: Wallace Going, DO;  Location: [place];  Service: Plastics;  Laterality: Left;   MASTECTOMY MODIFIED RADICAL Left 08/27/2017   MASTECTOMY MODIFIED RADICAL Left 08/27/2017   Procedure: LEFT MODIFIED RADICAL MASTECTOMY;  Surgeon: Erroll Luna, MD;  Location: [place];  Service: General;  Laterality: Left;   MASTOPEXY Right 03/03/2019   Procedure: RIGHT BREAST MASTOPEXY/REDUCTION;  Surgeon: Wallace Going, DO;  Location: [name];  Service: Plastics;  Laterality: Right;  3 hours, please   PORT-A-CATH REMOVAL Right 08/27/2017   Procedure: REMOVAL PORT-A-CATH RIGHT CHEST;  Surgeon: Erroll Luna, MD;  Location: [place];  Service: General;  Laterality: Right;   PORTACATH PLACEMENT Right 01/28/2017   Procedure: INSERTION PORT-A-CATH WITH ULTRASOUND;  Surgeon: Erroll Luna, MD;  Location: [place];  Service: General;  Laterality: Right;   REDUCTION MAMMAPLASTY Right    REMOVAL OF TISSUE EXPANDER AND PLACEMENT OF IMPLANT Left 03/03/2019   Procedure: REMOVAL OF TISSUE EXPANDER AND PLACEMENT OF EXPANDER;  Surgeon: Wallace Going, DO;  Location: [place];  Service: Plastics;  Laterality: Left;   REMOVAL OF TISSUE EXPANDER AND PLACEMENT OF IMPLANT Left 05/25/2019   Procedure: LEFT BREAST REMOVAL OF TISSUE EXPANDER AND PLACEMENT OF IMPLANT;  Surgeon: Wallace Going, DO;  Location: [place];  Service: Plastics;  Laterality: Left;   TISSUE EXPANDER  PLACEMENT Left 11/03/2018   Procedure: PLACEMENT OF TISSUE EXPANDER LEFT BREAST;  Surgeon: Wallace Going, DO;  Location: [place];  Service: Plastics;  Laterality: Left;  Total case time is 3.5 hours   TUBAL LIGATION Bilateral 01/21/2011    There were no vitals filed for this visit.   Subjective Assessment - 05/21/21 0816     Subjective no pain in knees this morning upon arrival, up to 3/10 with riding bike    Pertinent History h/o lymphedema in left UE,  breast CA,    Patient Stated Goals Walk up and down steps without pain    Currently in Pain? Yes    Pain Score 3     Pain Location Knee    Pain Orientation Right;Left    Pain Descriptors / Indicators Aching    Pain Onset More than a month ago    Pain Frequency Intermittent    Aggravating Factors  bending, stairs    Pain Relieving Factors resting, meds                               [facility] Adult PT Treatment/Exercise - 05/21/21 0817       Neuro Re-ed    Neuro Re-ed Details  Airex: tandum stance with intermittent finger tap support for balance, SLS on Airex: x4 reps with intermittent finger taps for support      Knee/Hip Exercises: Aerobic   Recumbent Bike L4 x 6 minutes      Knee/Hip Exercises: Machines for Strengthening   Cybex Knee Extension DL 20# 2x10    Cybex Knee Flexion DL 35# 2x10    Total Gym Leg Press single LE: 50# 2x10, double leg 87# 2x10      Knee/Hip Exercises: Standing   Lateral Step Up Both;10 reps;Hand Hold: 1;Step Height: 6"    Forward Step Up Both;10 reps;Hand Hold: 0;Step Height: 6"                       PT Short Term Goals - 05/14/21 2951       PT SHORT TERM GOAL #1   Title Pt will be independent in her initial HEP.      PT SHORT TERM GOAL #2   Title Pt will be able to perform sit to stand in </= 14 seconds NO UE support.    Baseline 12 seconds on 05/14/2021    Status Achieved               PT Long Term Goals - 04/23/21 1016       PT LONG TERM GOAL #1   Title Pt will be independent in her advanced HEP.    Status On-going      PT LONG TERM GOAL #2   Title Pt will be able to amb community distances >/= 20 minutes with pain </= 3/10 in bilateral knees.    Status On-going      PT LONG TERM GOAL #3   Title Pt will improve her bilateral knee strength to grossly 5/5 in order to improve functional mobility.    Status On-going      PT LONG TERM GOAL #4   Title Pt will be able to navigate 1 flight stairs with  step over step gait pattern with single hand rail with pain </= 3/10 in bilateral knees.    Status On-going      PT LONG TERM GOAL #5  Title Pt will improve her FOTO to >/= 73 %    Status On-going                   Plan - 05/21/21 0819     Clinical Impression Statement Pt arriving today 10 minutes late. Pt reporting no pain but reporting 3/10 pain when on the bike. Pt tolerating exercises well progressing on strength and dynamic balance. Continue skilled PT to maximize function.    Personal Factors and Comorbidities Comorbidity 3+    Comorbidities lymphedema, breat cancer, anemia, C-section, mastectomy, breast reconstruction, pre-diabetic    Examination-Activity Limitations Lift;Stand;Stairs;Squat;Transfers    Stability/Clinical Decision Making Stable/Uncomplicated    Rehab Potential Good    PT Frequency 2x / week    PT Duration 6 weeks    PT Treatment/Interventions ADLs/Self Care Home Management;Cryotherapy;Balance training;Therapeutic exercise;Therapeutic activities;Functional mobility training;Stair training;Gait training;Neuromuscular re-education;Ultrasound;Patient/family education;Passive range of motion;Manual techniques;Taping;Joint Manipulations;Vasopneumatic Device    PT Next Visit Plan Pt will need date extension at next visit. bike/nustep, strengthening; dynamic balance    PT Home Exercise Plan Access Code: [place] and Agree with Plan of Care Patient             Patient will benefit from skilled therapeutic intervention in order to improve the following deficits and impairments:     Visit Diagnosis: Chronic pain of right knee  Chronic pain of left knee  Difficulty in walking, not elsewhere classified  Localized edema  Muscle weakness (generalized)     Problem List Patient Active Problem List   Diagnosis Date Noted   Polyphagia 05/16/2020   Vitamin D deficiency 05/16/2020   At risk for osteoporosis 05/16/2020   Prediabetes  05/01/2020   Elevated blood pressure reading 05/01/2020   At risk for impaired metabolic function 35/70/1779   Breast asymmetry following reconstructive surgery 09/20/2019   Benign essential HTN 08/17/2018   Class 3 severe obesity with serious comorbidity and body mass index (BMI) of 40.0 to 44.9 in adult ([place]) 09/17/2017   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   Malignant neoplasm of upper-inner quadrant of left breast in female, estrogen receptor positive ([place]) 07/27/2017   Genetic testing 03/25/2017   Malignant 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, estrogen receptor positive ([place]) 01/23/2017   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

## 2021-05-22 ENCOUNTER — Telehealth: Payer: Self-pay | Admitting: *Deleted

## 2021-05-22 ENCOUNTER — Other Ambulatory Visit: Payer: Self-pay | Admitting: *Deleted

## 2021-05-22 NOTE — Telephone Encounter (Addendum)
[name]: LM for patient confirming her appointment for next week 05/27/21.  Informed patient no need to fast for lab as we are checking hormone levels only to make sure it is still okay to continue holding the goserelin.  Reminded patient to bring in her Ribociclib bottles and medication diaries. Asked patient to return call if any problems or questions about this visit.  Foye Spurling, BSN, RN, CCRP Clinical Research Nurse 05/22/2021 11:53 AM   Patient LM confirming the appointment on Monday.  Foye Spurling, BSN, RN, [employer] Clinical Research Nurse 05/23/2021 9:37 AM

## 2021-05-23 ENCOUNTER — Other Ambulatory Visit: Payer: Self-pay

## 2021-05-23 ENCOUNTER — Ambulatory Visit (INDEPENDENT_AMBULATORY_CARE_PROVIDER_SITE_OTHER): Payer: 59 | Admitting: Adult Health

## 2021-05-23 ENCOUNTER — Encounter: Payer: Self-pay | Admitting: Oncology

## 2021-05-23 VITALS — BP 128/78 | HR 114 | Temp 98.3°F | Ht 65.0 in | Wt 229.0 lb

## 2021-05-23 DIAGNOSIS — R7303 Prediabetes: Secondary | ICD-10-CM | POA: Diagnosis not present

## 2021-05-23 DIAGNOSIS — Z6841 Body Mass Index (BMI) 40.0 and over, adult: Secondary | ICD-10-CM

## 2021-05-23 DIAGNOSIS — E559 Vitamin D deficiency, unspecified: Secondary | ICD-10-CM

## 2021-05-23 DIAGNOSIS — Z9189 Other specified personal risk factors, not elsewhere classified: Secondary | ICD-10-CM

## 2021-05-23 DIAGNOSIS — D649 Anemia, unspecified: Secondary | ICD-10-CM | POA: Diagnosis not present

## 2021-05-23 DIAGNOSIS — R5383 Other fatigue: Secondary | ICD-10-CM | POA: Diagnosis not present

## 2021-05-23 NOTE — Progress Notes (Signed)
Chief Complaint:   OBESITY Mackenzie [name] is here to discuss her progress with her obesity treatment plan along with follow-up of her obesity related diagnoses. Mackenzie [name] is on the Category 3 Plan and states she is following her eating plan approximately 75% of the time. Mackenzie [name] states she is doing PT for 45 minutes 2 times per week.  Today's visit was #: 66 Starting weight: 267 lbs Starting date: 08/09/2019 Today's weight: 229 lbs Today's date: 05/23/2021 Total lbs lost to date: 38 lbs Total lbs lost since last in-office visit: 8 lbs  Interim History:  Mackenzie [name] has had increased fatigue over the last several weeks.   Last routine labs were in September 2022. She is doing PT twice a week for bilateral knee pain. She is not exercising. She denies hx of OSA or hx of snoring. She believes the increase in fatigue coincides with day light savings time change.  Subjective:   1. Pre-diabetes She is on metformin 500 mg BID, Ozempic 2 mg once weekly. She denies mass in neck, dysphagia, dyspepsia, persistent hoarseness, or GI upset. She reports stable appetite.  2. Other fatigue For the last several weeks she has had increased fatigue. She sleeps from 10 pm - 5 am and wakes 1 time to urinate. She denies OSA or snoring.  3. Hemoglobin low Stopped menses in 2018 during chemotherapy for Breast CA tx.  4. Vitamin D deficiency She is on a calcium/vitamin D supplement.  5. At risk for diabetes mellitus Eliabeth is at higher than average risk for developing diabetes due to prediabetes and obesity.   Assessment/Plan:   1. Pre-diabetes Check labs today.  - Comprehensive metabolic panel - Hemoglobin A1c - Insulin, random  2. Other fatigue Will check labs today.  - Vitamin B12 - TSH + free T4 - T3  3. Hemoglobin low Check labs today.  - CBC  4. Vitamin D deficiency Will check vitamin D level today.  - VITAMIN D 25 Hydroxy (Vit-D Deficiency, Fractures)  5. At risk for  diabetes mellitus Kenna was given approximately 15 minutes of diabetes education and counseling today. We discussed intensive lifestyle modifications today with an emphasis on weight loss as well as increasing exercise and decreasing simple carbohydrates in her diet. We also reviewed medication options with an emphasis on risk versus benefit of those discussed.   Repetitive spaced learning was employed today to elicit superior memory formation and behavioral change.  6. Obesity: Current BMI 38.2  Mackenzie [name] is currently in the action stage of change. As such, her goal is to continue with weight loss efforts. She has agreed to the Category 3 Plan.   To reduce fatigue: - Increase protien - Increase regular walking - Continue with OTC supplements  Exercise goals: Increase regular walking.  Behavioral modification strategies: increasing lean protein intake, decreasing simple carbohydrates, meal planning and cooking strategies, keeping healthy foods in the home, and planning for success.  Ludwika has agreed to follow-up with our clinic in 2-3 weeks. She was informed of the importance of frequent follow-up visits to maximize her success with intensive lifestyle modifications for her multiple health conditions.   Lacee was informed we would discuss her lab results at her next visit unless there is a critical issue that needs to be addressed sooner. Markela agreed to keep her next visit at the agreed upon time to discuss these results.  Objective:   Blood pressure 128/78, pulse (!) 114, temperature 98.3 F (36.8 C), height 5\' 5"  (1.651 m), weight  229 lb (103.9 kg), last menstrual period 02/10/2017, SpO2 98 %, unknown if currently breastfeeding. Body mass index is 38.11 kg/m.  General: Cooperative, alert, well developed, in no acute distress. HEENT: Conjunctivae and lids unremarkable. Cardiovascular: Regular rhythm.  Lungs: Normal work of breathing. Neurologic: No focal deficits.   Lab  Results  Component Value Date   CREATININE 0.88 04/07/2021   BUN 25 (H) 04/07/2021   NA 137 04/07/2021   K 3.7 04/07/2021   CL 103 04/07/2021   CO2 27 04/07/2021   Lab Results  Component Value Date   ALT 17 03/01/2021   AST 19 03/01/2021   GGT 19 03/01/2021   ALKPHOS 107 03/01/2021   BILITOT 0.3 03/01/2021   Lab Results  Component Value Date   HGBA1C 5.4 02/27/2021   HGBA1C 5.4 11/15/2020   HGBA1C 5.1 06/27/2020   HGBA1C 5.6 01/09/2020   HGBA1C 5.9 (H) 08/09/2019   Lab Results  Component Value Date   INSULIN 15.1 02/27/2021   INSULIN 11.7 11/15/2020   INSULIN 13.3 01/11/2020   INSULIN 12.6 08/09/2019   Lab Results  Component Value Date   TSH 0.657 08/09/2019   Lab Results  Component Value Date   CHOL 202 (H) 11/15/2020   HDL 58 11/15/2020   LDLCALC 135 (H) 11/15/2020   TRIG 52 11/15/2020   CHOLHDL 3.5 11/15/2020   Lab Results  Component Value Date   VD25OH 83.0 02/27/2021   VD25OH 74.7 11/15/2020   VD25OH 73.12 06/27/2020   Lab Results  Component Value Date   WBC 4.9 04/07/2021   HGB 11.6 (L) 04/07/2021   HCT 35.5 (L) 04/07/2021   MCV 87.4 04/07/2021   PLT 237 04/07/2021   Attestation Statements:   Reviewed by clinician on day of visit: allergies, medications, problem list, medical history, surgical history, family history, social history, and previous encounter notes.  I, Water quality scientist, CMA, am acting as Location manager for [name], NP.  I have reviewed the above documentation for accuracy and completeness, and I agree with the above. -  Darcel Frane d. Benz Vandenberghe, NP-C

## 2021-05-24 ENCOUNTER — Ambulatory Visit: Payer: 59 | Admitting: Physical Therapy

## 2021-05-24 ENCOUNTER — Encounter: Payer: Self-pay | Admitting: Oncology

## 2021-05-24 ENCOUNTER — Encounter: Payer: Self-pay | Admitting: Physical Therapy

## 2021-05-24 DIAGNOSIS — M25561 Pain in right knee: Secondary | ICD-10-CM

## 2021-05-24 DIAGNOSIS — M6281 Muscle weakness (generalized): Secondary | ICD-10-CM

## 2021-05-24 DIAGNOSIS — R600 Localized edema: Secondary | ICD-10-CM

## 2021-05-24 DIAGNOSIS — G8929 Other chronic pain: Secondary | ICD-10-CM | POA: Diagnosis not present

## 2021-05-24 DIAGNOSIS — M25562 Pain in left knee: Secondary | ICD-10-CM

## 2021-05-24 DIAGNOSIS — R262 Difficulty in walking, not elsewhere classified: Secondary | ICD-10-CM | POA: Diagnosis not present

## 2021-05-24 LAB — CBC
Hematocrit: 33.7 % — ABNORMAL LOW (ref 34.0–46.6)
Hemoglobin: 10.9 g/dL — ABNORMAL LOW (ref 11.1–15.9)
MCH: 27.7 pg (ref 26.6–33.0)
MCHC: 32.3 g/dL (ref 31.5–35.7)
MCV: 86 fL (ref 79–97)
Platelets: 207 10*3/uL (ref 150–450)
RBC: 3.94 x10E6/uL (ref 3.77–5.28)
RDW: 12.5 % (ref 11.7–15.4)
WBC: 3.2 10*3/uL — ABNORMAL LOW (ref 3.4–10.8)

## 2021-05-24 LAB — COMPREHENSIVE METABOLIC PANEL
ALT: 52 IU/L — ABNORMAL HIGH (ref 0–32)
AST: 40 IU/L (ref 0–40)
Albumin/Globulin Ratio: 1.6 (ref 1.2–2.2)
Albumin: 3.9 g/dL (ref 3.8–4.9)
Alkaline Phosphatase: 97 IU/L (ref 44–121)
BUN/Creatinine Ratio: 30 — ABNORMAL HIGH (ref 9–23)
BUN: 13 mg/dL (ref 6–24)
Bilirubin Total: 0.3 mg/dL (ref 0.0–1.2)
CO2: 22 mmol/L (ref 20–29)
Calcium: 9.3 mg/dL (ref 8.7–10.2)
Chloride: 105 mmol/L (ref 96–106)
Creatinine, Ser: 0.44 mg/dL — ABNORMAL LOW (ref 0.57–1.00)
Globulin, Total: 2.4 g/dL (ref 1.5–4.5)
Glucose: 89 mg/dL (ref 70–99)
Potassium: 3.5 mmol/L (ref 3.5–5.2)
Sodium: 141 mmol/L (ref 134–144)
Total Protein: 6.3 g/dL (ref 6.0–8.5)
eGFR: 116 mL/min/{1.73_m2} (ref >59)

## 2021-05-24 LAB — HEMOGLOBIN A1C
Est. average glucose Bld gHb Est-mCnc: 108 mg/dL
Hgb A1c MFr Bld: 5.4 % (ref 4.8–5.6)

## 2021-05-24 LAB — VITAMIN D 25 HYDROXY (VIT D DEFICIENCY, FRACTURES): Vit D, 25-Hydroxy: 102 ng/mL — ABNORMAL HIGH (ref 30.0–100.0)

## 2021-05-24 LAB — TSH+FREE T4
Free T4: 5.96 ng/dL — ABNORMAL HIGH (ref 0.82–1.77)
TSH: <0.005 u[IU]/mL — ABNORMAL LOW (ref 0.450–4.500)

## 2021-05-24 LAB — VITAMIN B12: Vitamin B-12: >2000 pg/mL — ABNORMAL HIGH (ref 232–1245)

## 2021-05-24 LAB — INSULIN, RANDOM: INSULIN: 15.1 u[IU]/mL (ref 2.6–24.9)

## 2021-05-24 LAB — T3: T3, Total: 417 ng/dL — ABNORMAL HIGH (ref 71–180)

## 2021-05-24 NOTE — Patient Instructions (Signed)
Access Code: [place] URL: [email or web address] Date: 05/24/2021 Prepared by: Faustino Congress  Exercises Supine Bridge with [employer] Between Knees - 2 x daily - 7 x weekly - 2 sets - 10 reps - 5 seconds hold Seated Hamstring Stretch - 2 x daily - 7 x weekly - 6 reps - 30 seconds hold Heel Toe Raises with Unilateral Counter Support - 2 x daily - 7 x weekly - 2 sets - 10 reps Supine Active Straight Leg Raise - 2 x daily - 7 x weekly - 4 sets - 10 reps - 2-3 seconds hold Squat with Chair Touch - 2 x daily - 7 x weekly - 10 reps Clamshell with Resistance - 1 x daily - 7 x weekly - 4 sets - 10 reps Seated Straight Leg Raise - 1 x daily - 7 x weekly - 3 sets - 10 reps Kettlebell Deadlift - 1 x daily - 7 x weekly - 3 sets - 10 reps Forward and Backward Walking Lunge in [place] Hamstring Stretch with Noodle - 20 seconds hold Side Stepping Standing Hip Abduction Adduction at [employer] - 2 sets - 10 reps Standing Hip Flexion Extension at [employer] - 2 sets - 10 reps Squat - 2 sets - 10 reps Knee Extension with Weight Machine - 1 x daily - 3-4 x weekly - 3 sets - 10 reps Single Leg Knee Extension with Weight Machine - 1 x daily - 3-4 x weekly - 3 sets - 10 reps Hamstring Curl with Weight Machine - 1 x daily - 3-4 x weekly - 3 sets - 10 reps Single Leg Hamstring Curl with Weight Machine - 1 x daily - 3-4 x weekly - 3 sets - 10 reps Full Leg Press - 1 x daily - 3-4 x weekly - 3 sets - 10 reps Single Leg Press - 1 x daily - 3-4 x weekly - 3 sets - 10 reps

## 2021-05-24 NOTE — Therapy (Signed)
[facility] [address] [place], [place], [address] Phone: [phone]   Fax:  [phone]  Physical Therapy Treatment/Recertification  Patient Details  Name: Mackenzie [name] MRN: [id number] Date of Birth: 11/07/1970 Referring Provider (PT): Frankey Shown MD   Encounter Date: 05/24/2021   PT End of Session - 05/24/21 0840     Visit Number 9    Number of Visits 15    Date for PT Re-Evaluation 07/05/21    Authorization Type [employer]    PT Start Time 0800    PT Stop Time 0840    PT Time Calculation (min) 40 min    Activity Tolerance Patient tolerated treatment well    Behavior During Therapy [facility] for tasks assessed/performed             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   Allergic rhinitis 2012   Anemia 01/27/2017   prior to starting chemotherapy   Breast cancer ([place]) 01/14/2017   Left breast   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, left ([place]) 01/23/2017   Edema, lower extremity    Hot flashes 03/2017   Hyperlipidemia 03/20/2016   Morbid obesity with body mass index (BMI) of 40.0 to 49.9 ([place]) 09/17/2017   NCGS (non-celiac gluten sensitivity)    Pre-diabetes 07/26/2018   Hgb A1C elevated on 07/26/2018, Gestational Diabetes 2012   Seasonal allergies 2012   seasonal allergies causes allergic rhinitis and itchy, dry eyes per pt   Vitamin D deficiency 05/2015    Past Surgical History:  Procedure Laterality Date   BREAST RECONSTRUCTION WITH PLACEMENT OF TISSUE EXPANDER AND FLEX HD (ACELLULAR HYDRATED DERMIS) Left 08/27/2017   Procedure: LEFT BREAST RECONSTRUCTION WITH PLACEMENT OF TISSUE EXPANDER AND FLEX HD;  Surgeon: Wallace Going, DO;  Location: [place];  Service: Plastics;  Laterality: Left;   CESAREAN SECTION     x2   LATISSIMUS FLAP TO BREAST Left 11/03/2018   LATISSIMUS FLAP TO BREAST Left 11/03/2018   Procedure: LATISSIMUS  FLAP TO LEFT BREAST;  Surgeon: Wallace Going, DO;  Location: [place];  Service: Plastics;  Laterality: Left;   MASTECTOMY MODIFIED RADICAL Left 08/27/2017   MASTECTOMY MODIFIED RADICAL Left 08/27/2017   Procedure: LEFT MODIFIED RADICAL MASTECTOMY;  Surgeon: Erroll Luna, MD;  Location: [place];  Service: General;  Laterality: Left;   MASTOPEXY Right 03/03/2019   Procedure: RIGHT BREAST MASTOPEXY/REDUCTION;  Surgeon: Wallace Going, DO;  Location: [place];  Service: Plastics;  Laterality: Right;  3 hours, please   PORT-A-CATH REMOVAL Right 08/27/2017   Procedure: REMOVAL PORT-A-CATH RIGHT CHEST;  Surgeon: Erroll Luna, MD;  Location: [place];  Service: General;  Laterality: Right;   PORTACATH PLACEMENT Right 01/28/2017   Procedure: INSERTION PORT-A-CATH WITH ULTRASOUND;  Surgeon: Erroll Luna, MD;  Location: [place];  Service: General;  Laterality: Right;   REDUCTION MAMMAPLASTY Right    REMOVAL OF TISSUE EXPANDER AND PLACEMENT OF IMPLANT Left 03/03/2019   Procedure: REMOVAL OF TISSUE EXPANDER AND PLACEMENT OF EXPANDER;  Surgeon: Wallace Going, DO;  Location: [place];  Service: Plastics;  Laterality: Left;   REMOVAL OF TISSUE EXPANDER AND PLACEMENT OF IMPLANT Left 05/25/2019   Procedure: LEFT BREAST REMOVAL OF TISSUE EXPANDER AND PLACEMENT OF IMPLANT;  Surgeon: Wallace Going, DO;  Location: [place];  Service: Plastics;  Laterality: Left;   TISSUE EXPANDER  PLACEMENT Left 11/03/2018   Procedure: PLACEMENT OF TISSUE EXPANDER LEFT BREAST;  Surgeon: Wallace Going, DO;  Location: [place];  Service: Plastics;  Laterality: Left;  Total case time is 3.5 hours   TUBAL LIGATION Bilateral 01/21/2011    There were no vitals filed for this visit.   Subjective Assessment - 05/24/21 0802     Subjective knees are doing well. still wants to work on strengthening    Pertinent History h/o lymphedema in left UE,  breast CA,    Patient Stated Goals Walk up and down steps without pain    Currently in Pain? 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    Referring Provider (PT) Frankey Shown MD      Observation/Other Assessments   Focus on Therapeutic Outcomes (FOTO)  75      Strength   Overall Strength Comments bil knees 4/5                           [facility] Adult PT Treatment/Exercise - 05/24/21 0807       Knee/Hip Exercises: Aerobic   Nustep L6 x 8 min (LEs only)      Knee/Hip Exercises: Machines for Strengthening   Cybex Knee Extension DL 20# 2x10    Cybex Knee Flexion DL 30# 2x10    Total Gym Leg Press single LE: 50# 2x10, double leg 87# 2x10      Knee/Hip Exercises: Standing   Lateral Step Up Both;2 sets;10 reps;Hand Hold: 0;Step Height: 6"    Forward Step Up Both;10 reps;Hand Hold: 0;Step Height: 6";2 sets                     PT Education - 05/24/21 0840     Education Details added gym machines    Person(s) Educated Patient    Methods Explanation;Demonstration;Handout    Comprehension Verbalized understanding;Returned demonstration              PT Short Term Goals - 05/14/21 0832       PT SHORT TERM GOAL #1   Title Pt will be independent in her initial HEP.      PT SHORT TERM GOAL #2   Title Pt will be able to perform sit to stand in </= 14 seconds NO UE support.    Baseline 12 seconds on 05/14/2021    Status Achieved               PT Long Term Goals - 05/24/21 0840       PT LONG TERM GOAL #1   Title Pt will be independent in her advanced HEP.    Status On-going    Target Date 07/05/21      PT LONG TERM GOAL #2   Title Pt will be able to amb community distances >/= 20 minutes with pain </= 3/10 in bilateral knees.    Status Achieved      PT LONG TERM GOAL #3   Title Pt will improve her bilateral knee strength to  grossly 5/5 in order to improve functional mobility.    Baseline 12/16: 4/5    Status On-going    Target Date 07/05/21      PT LONG TERM GOAL #4   Title Pt  will be able to navigate 1 flight stairs with step over step gait pattern with single hand rail with pain </= 3/10 in bilateral knees.    Baseline 12/16: still using step to pattern    Status On-going    Target Date 07/05/21      PT LONG TERM GOAL #5   Title Pt will improve her FOTO to >/= 73 %    Status Achieved                   Plan - 05/24/21 0841     Clinical Impression Statement Pt has met 2/5 LTGs at this time and has demonstrated progress towards other goals.  She will benefit from PT to address remaining unmet goals, and plan to decrease to 1x/wk with pt going to gym.  Will set goals for up to 6 weeks, and anticipate she will only need 2-4 more visits of PT.    Personal Factors and Comorbidities Comorbidity 3+    Comorbidities lymphedema, breat cancer, anemia, C-section, mastectomy, breast reconstruction, pre-diabetic    Examination-Activity Limitations Lift;Stand;Stairs;Squat;Transfers    Examination-Participation Restrictions Community Activity;Other;Occupation;Yard Work    Stability/Clinical Decision Making Stable/Uncomplicated    Rehab Potential Good    PT Frequency 1x / week    PT Duration 6 weeks    PT Treatment/Interventions ADLs/Self Care Home Management;Cryotherapy;Balance training;Therapeutic exercise;Therapeutic activities;Functional mobility training;Stair training;Gait training;Neuromuscular re-education;Ultrasound;Patient/family education;Passive range of motion;Manual techniques;Taping;Joint Manipulations;Vasopneumatic Device    PT Next Visit Plan bike/nustep, strengthening; dynamic balance; stairs    PT Home Exercise Plan Access Code: [place]    Consulted and Agree with Plan of Care Patient             Patient will benefit from skilled therapeutic intervention in order to improve the  following deficits and impairments:  Pain, Decreased range of motion, Difficulty walking, Obesity, Decreased activity tolerance, Impaired flexibility, Postural dysfunction, Decreased strength, Decreased mobility, Increased edema  Visit Diagnosis: Chronic pain of right knee - Plan: PT plan of care cert/re-cert  Chronic pain of left knee - Plan: PT plan of care cert/re-cert  Difficulty in walking, not elsewhere classified - Plan: PT plan of care cert/re-cert  Localized edema - Plan: PT plan of care cert/re-cert  Muscle weakness (generalized) - Plan: PT plan of care cert/re-cert     Problem List Patient Active Problem List   Diagnosis Date Noted   Other fatigue 05/23/2021   Hemoglobin low 05/23/2021   Polyphagia 05/16/2020   Vitamin D deficiency 05/16/2020   At risk for osteoporosis 05/16/2020   Prediabetes 05/01/2020   Elevated blood pressure reading 05/01/2020   At risk for impaired metabolic function 62/83/6629   Breast asymmetry following reconstructive surgery 09/20/2019   Benign essential HTN 08/17/2018   Class 3 severe obesity with serious comorbidity and body mass index (BMI) of 40.0 to 44.9 in adult [facility]) 09/17/2017   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   Malignant neoplasm of upper-inner quadrant of left breast in female, estrogen receptor positive ([place]) 07/27/2017   Genetic testing 03/25/2017   Malignant 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, estrogen receptor positive ([place]) 01/23/2017   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 [place], [place], [address] Phone: [phone]   Fax:  [phone]  Name: MARITSA HUNSUCKER MRN: [id number] Date of Birth: Jan 24, 1971

## 2021-05-27 ENCOUNTER — Telehealth (INDEPENDENT_AMBULATORY_CARE_PROVIDER_SITE_OTHER): Payer: Self-pay

## 2021-05-27 ENCOUNTER — Telehealth: Payer: Self-pay | Admitting: *Deleted

## 2021-05-27 ENCOUNTER — Encounter (INDEPENDENT_AMBULATORY_CARE_PROVIDER_SITE_OTHER): Payer: Self-pay | Admitting: Adult Health

## 2021-05-27 ENCOUNTER — Other Ambulatory Visit: Payer: 59

## 2021-05-27 ENCOUNTER — Encounter: Payer: 59 | Admitting: *Deleted

## 2021-05-27 ENCOUNTER — Other Ambulatory Visit (INDEPENDENT_AMBULATORY_CARE_PROVIDER_SITE_OTHER): Payer: Self-pay | Admitting: Adult Health

## 2021-05-27 DIAGNOSIS — R7989 Other specified abnormal findings of blood chemistry: Secondary | ICD-10-CM

## 2021-05-27 NOTE — Telephone Encounter (Signed)
Mackenzie [name]: Patient notified research nurse she could not make it this morning for scheduled appointments. She agreed to come in tomorrow morning. Lab rescheduled to tomorrow at 8:30 am.  Research nurse will meet with patient before or after appointment depending on what time patient arrives. Reminded patient to bring completed medication diaries and Ribociclib bottles.   Foye Spurling, BSN, RN, [employer] Clinical Research Nurse 05/27/2021 9:06 AM

## 2021-05-27 NOTE — Telephone Encounter (Signed)
Attempted to contact pt to advise pt that her Vit D and B12 levels are both elevated- and to instruct pt to stop all supplementation for Vit D and B12. Also inform pt that her Hemoglobin/Hematocrit continue to be low, yet are stable. As well to advise pt that her TSH is severely low with quite elevated Free T4 and T3-  A Referral to Endocrinology has been placed- and she should establish with this Specialist immediately. We will discuss all these results in detail at her next appt. Also it is recommend that she follow-up with her PCP/Dr. Darron Doom to discuss.LMOM for pt to return call as soon as possible. Also attempted to reach pt at work number listed without success.

## 2021-05-28 ENCOUNTER — Other Ambulatory Visit: Payer: Self-pay

## 2021-05-28 ENCOUNTER — Inpatient Hospital Stay: Payer: 59 | Attending: Adult Health

## 2021-05-28 ENCOUNTER — Encounter: Payer: Self-pay | Admitting: *Deleted

## 2021-05-28 DIAGNOSIS — C773 Secondary and unspecified malignant neoplasm of axilla and upper limb lymph nodes: Secondary | ICD-10-CM

## 2021-05-28 DIAGNOSIS — C50912 Malignant neoplasm of unspecified site of left female breast: Secondary | ICD-10-CM

## 2021-05-28 DIAGNOSIS — Z79811 Long term (current) use of aromatase inhibitors: Secondary | ICD-10-CM | POA: Insufficient documentation

## 2021-05-28 DIAGNOSIS — Z170 Estrogen receptor positive status [ER+]: Secondary | ICD-10-CM

## 2021-05-28 DIAGNOSIS — C50812 Malignant neoplasm of overlapping sites of left female breast: Secondary | ICD-10-CM | POA: Insufficient documentation

## 2021-05-28 DIAGNOSIS — C50212 Malignant neoplasm of upper-inner quadrant of left female breast: Secondary | ICD-10-CM

## 2021-05-28 NOTE — Research (Addendum)
[name] - Visit 18   [name] arrived today unaccompanied for her scheduled Visit 18 on the [name] protocol.  Labs: Blood drawn to monitor hormone levels for continued assessment of holding Goserelin injections.  Results not available today.   PROs: Not required this visit.    Vital Signs: Not required this visit.   Healthcare Resource Utilization: Patient was seen in ED on 04/07/21 for chest pain and shortness of breath. She was evaluated and released same day. She did not have any unscheduled visits in our clinic for any issues related to her breast cancer or treatment.   Concomitant Medications: Current medication list reviewed with patient. Patient reports she has not taken phenylephrine since March 2022.  She was taking it earlier this year occasionally for baseline allergy symptoms. Her Vitamin D and B12 levels were elevated recently and she was instructed to stop taking supplements with vitamin D and B. Patient was prescribed amoxicillin for cold symptoms in November which she completed. Patient also started on celecoxib and diclonfenac for her knee pain (osteoarthritis).  She continues to take celecoxib once a day but is not using the diclofenac cream any longer.  Patient had a flu vaccine the last week of September at work.  Medication list updated in EMR.   Adverse Events:  Patient reports the following: Cellulitis Resolved completely a few days after her last study visit.  Dyspnea Worsened since day of ED visit. Patient c/o feeling sob with minimal exertion at times and it has limited her ability to perform instrumental ADLs such as shopping occasionally. Dr. Jana Hakim states not related to study drugs.  Osteoarthritis Orthopedic MD diagnosed patient's bilateral knee pain as being caused by Osteroarthritis. AE term changed from "knee pain" to "osteoarthritis." Patient had durolane injections in both knees and patient reports pain has improved in both knees but not  completely gone. Dr. Jana Hakim states not related to study drugs.  Osteoporosis Bone density scan on 03/20/21 showed osteopenia. Dr. Jana Hakim states could be related to letrozole and goserelin.  Non-cardiac Chest Pain ED visit on 04/07/21 for c/o chest pain and dyspnea. Subsequent evaluation by Cardiologist suggests Costochondritis but stress test is ordered and still pending. Patient denies any chest pain since ED visit. Dyspnea is ongoing. Dr. Jana Hakim states this is not related to study drugs.  Sore Toenail left foot Podiatrist treated sore left toenail on 04/22/21 and patient reports pain resolved after that visit. Dr. Jana Hakim states not related to study drugs.  Upper respiratory infection Mild cold symptoms x 3 days with congestion, runny nose and headache. Denies any fevers or cough. She did not take any OTC medication for these symptoms. Patient reports Covid19 test was negative and PCP prescribed amoxicillin just to be sure her symptoms didn't worsen. Dr. Jana Hakim states not related to study drugs.  Hyperthyroidism Outside labs completed by [facility] on 05/23/21 show decreased TSH and elevated T3 and T4 which indicate hyperthyroidism. Urgent referral was made to Endocrinlogy and patient is waiting for that appointment. Patient denies symptoms unless her dyspnea and chest pain are symptoms. Noted patient has recent increase in weight loss but was attributing that to her diet.  Dr. Jana Hakim states this is unrelated to study drugs.  Elevated Vitamin D and B12  Outside labs done by [facility] on 05/23/21 show elevated Vit D and B12.  Dr. Jana Hakim states this is not related to the study drugs and results are not considered clinically significant.   Anemia (Grade 1)  Outside labs done by [facility] on 05/23/21- not considered clinically significant by Dr. Jana Hakim.  Decreased WBC (Grade 1) Outside labs done by [facility] on 05/23/21- Grade 1 not considered clinically  significant by Dr. Jana Hakim.   Patient confirmed other previously noted AEs are ongoing, without change.  These are listed in AE table below as ongoing.      Physical Exam and Performance Status:  Not required this visit.  Investigational Medication:  Patient returned her completed medication diaries and bottles for cycles 37-39. Patient returned the following kits, drug count confirmed by Raul Del PharmD: Cycle 37 Kit [id number], 3 pills remaining indicating 0 missed doses ribociclib. Patient diary for this cycle concurs with 100% compliance Ribociclib and Letrozole this cycle. Cycle 38 Kit [id number], 7 pills remaining indicating 2 missed doses of ribociclib. Patient diary reflects 2 missed doses of both ribociclib and letrozole. Patient states she forgot to take them those days.  Cycle 39 Kit [id number], 9 pills remaining indicating 3 missed doses ribociclib this cycle.  Patients diary reflects 3 missed doses of both ribociclib and letrozole this cycle. Diary confirms last does of ribociclib taken was on 05/19/21.  Reminded patient of importance of taking letrozole daily as prescribed and she verbalized understanding. Patient was also provided with 3 more months of drug diaries for ongoing documentation of letrozole intake on study.   Plan:  Informed patient we will continue to hold goserelin. Patient understands depending on results of hormone levels, she may need to restart Goserelin injections. We will continue to hold until all results are available and research nurse can confirm with Dr. Jana Hakim (or [place]) if okay to continue to hold. Patient denies any menstrual bleeding and understands to notify Dr. Jana Hakim and/or research nurse if she starts to have any vaginal bleeding before next appointment.   Visit Scheduling - Patient will return to clinic in 3 months on 08/19/21 to see Dr. Chryl Heck (since Dr. Jana Hakim is retiring). Research nurse will call her in 3 weeks to complete 30 day post ribociclib  safety assessment for study. Patient aware that she should contact the clinic at any time if she has any questions or concerns prior to her next visit.  Patient thanked for her ongoing participation in the [place] trial.  AE Table: Event Grade Onset Date End Date Status Comments Attribution to Ribociclib Attribution to Letrozole Attribution to Goserelin  Allergic Rhinitis 2 2012   ongoing At Baseline n/a n/a n/a  Constipation 1 08/27/17   ongoing At Baseline n/a n/a n/a  Cough, intermittent 2 05/09/18   ongoing At Baseline n/a n/a n/a  GERD, intermittent 1 2018   ongoing At Baseline n/a n/a n/a  Gluten Sensitivity 1 12/2017   ongoing At Baseline n/a n/a n/a  Headaches 1 02/2017   ongoing At Baseline n/a n/a n/a  Hyperglycemia 1 2018   ongoing At Baseline n/a n/a n/a  Hyperlipidemia 1 03/2016   ongoing At Baseline n/a n/a n/a  Vitamin D Deficiency 2 05/2015   ongoing At Baseline n/a n/a n/a  Arthralgias bilat legs 1 10/01/18   ongoing At Baseline        Bilateral foot pain/ Pes Planus 2     ongoing At Baseline, recently needed new orthotics        Fatigue 1 06/28/18   ongoing   Yes No No  Hypertension  1-3 2012   ongoing At Baseline. Intermittent grade 1 - 3 recorded since 2012. Changed to History at  Baseline.         Hot Flashes 1 04/02/20  ongoing Grade 2 at Baseline  n/a n/a n/a  Hepatic Cysts 1 04/04/19   ongoing [name] 04/04/19 & MRI 07/06/19   No No No  Cholelithiasis 1 04/04/19   ongoing [name] 04/04/19 & MRI 07/06/19 No No No  Neuropathy bilateral feet 1 10/08/19   ongoing Resolved and then recurred. Gabapentin Yes No No  Dyspnea on exertion 1 08/09/19  04/07/21 ongoing  going upstairs No No No  Dyspnea 2 04/07/21  ongoing Increased grade No No No  Lymphedema bilateral arms 1 07/25/19   ongoing PHT and Sleeves No No No  Bilateral Knee Pain-Term Changed to Osteoarthritis 2 07/11/19   ongoing  PHT and steroid injections. Durolane injections 04/23/21 No No No  Nail Infection (fungus on toenails) 1 10/08/19    ongoing   No No No  Vaginal Dryness 1 10/17/19   ongoing   No Yes Yes  Pre-Diabetes 2 07/26/18  ongoing Elevated Hgb A1C, started metformin No  No No  GERD 2 12/10/20  ongoing Grade 1 at baseline No No No  Anxiety 2 02/16/2021  ongoing  No No No  Cellulitis 2 01/27/2021 03/08/21 ongoing Doxycycline No No No  Osteoporosis 1 03/20/2021  ongoing  No Yes Yes  Non Cardiac Chest Pain 2 04/07/2021 04/07/2021 resolved  No No No  Pain left big toenail 2 04/09/2021 04/22/2021 resolved Podiatrist removed toenail No No No  Upper respiratory infection  2 04/26/2021 04/28/2021 resolved Covid19 negative, took amoxicillin No No No  Hyperthyroidism 2 05/23/2021  ongoing Referral to Endocrinologist No No No   Mackenzie [name], BSN, RN Clinical Research Nurse  Addendum: AE of Upper Respiratory Infection was corrected from Grade 1 to Grade 2 per CTCAE 4.3.  Mackenzie [name], BSN, RN, [employer] Research Nurse II 07/05/2021 11:01 AM

## 2021-05-29 ENCOUNTER — Encounter: Payer: Self-pay | Admitting: Oncology

## 2021-05-29 LAB — LUTEINIZING HORMONE: LH: 8.1 m[IU]/mL

## 2021-05-29 LAB — FOLLICLE STIMULATING HORMONE: FSH: 12.5 m[IU]/mL

## 2021-06-01 LAB — ESTRADIOL, ULTRA SENS: Estradiol, Sensitive: <2.5 pg/mL

## 2021-06-04 DIAGNOSIS — E119 Type 2 diabetes mellitus without complications: Secondary | ICD-10-CM | POA: Diagnosis not present

## 2021-06-12 ENCOUNTER — Other Ambulatory Visit: Payer: Self-pay

## 2021-06-12 ENCOUNTER — Encounter (INDEPENDENT_AMBULATORY_CARE_PROVIDER_SITE_OTHER): Payer: Self-pay | Admitting: Adult Health

## 2021-06-12 ENCOUNTER — Ambulatory Visit (INDEPENDENT_AMBULATORY_CARE_PROVIDER_SITE_OTHER): Payer: 59 | Admitting: Adult Health

## 2021-06-12 ENCOUNTER — Encounter: Payer: Self-pay | Admitting: Oncology

## 2021-06-12 ENCOUNTER — Other Ambulatory Visit (HOSPITAL_COMMUNITY): Payer: Self-pay

## 2021-06-12 VITALS — BP 121/68 | HR 108 | Temp 98.0°F | Ht 65.0 in | Wt 225.0 lb

## 2021-06-12 DIAGNOSIS — Z6841 Body Mass Index (BMI) 40.0 and over, adult: Secondary | ICD-10-CM

## 2021-06-12 DIAGNOSIS — R7303 Prediabetes: Secondary | ICD-10-CM | POA: Diagnosis not present

## 2021-06-12 DIAGNOSIS — Z9189 Other specified personal risk factors, not elsewhere classified: Secondary | ICD-10-CM | POA: Diagnosis not present

## 2021-06-12 DIAGNOSIS — E559 Vitamin D deficiency, unspecified: Secondary | ICD-10-CM

## 2021-06-12 DIAGNOSIS — R5383 Other fatigue: Secondary | ICD-10-CM

## 2021-06-12 DIAGNOSIS — R7989 Other specified abnormal findings of blood chemistry: Secondary | ICD-10-CM | POA: Diagnosis not present

## 2021-06-12 DIAGNOSIS — D649 Anemia, unspecified: Secondary | ICD-10-CM

## 2021-06-12 MED ORDER — OZEMPIC (2 MG/DOSE) 8 MG/3ML ~~LOC~~ SOPN
2.0000 mg | PEN_INJECTOR | SUBCUTANEOUS | 0 refills | Status: DC
  Filled 2021-06-12: qty 3, 28d supply, fill #0
  Filled 2021-06-12: qty 9, 84d supply, fill #0

## 2021-06-12 NOTE — Progress Notes (Signed)
Chief Complaint:   OBESITY Mackenzie [name] is here to discuss her progress with her obesity treatment plan along with follow-up of her obesity related diagnoses. Mackenzie [name] is on the Category 3 Plan and states she is following her eating plan approximately 75% of the time. Mackenzie [name] states she is working with a Physiological scientist for 30 minutes 3 times per week.  Today's visit was #: 47 Starting weight: 267 lbs Starting date: 08/09/2019 Today's weight: 225 lbs Today's date: 06/12/2021 Total lbs lost to date: 42 lbs Total lbs lost since last in-office visit: 4 lbs  Interim History:  Reviewed labs at length with patient. 1) Endocrinology referral has been placed - awaiting new patient appointment. 2) She needs to schedule cardiac stress test, per Cardiology.  Subjective:   1. Pre-diabetes Discussed labs with patient today.  On 05/23/2021, BG 89, A1c 5.4, insulin 15.1. She is on metformin 500 mg BID, Ozempic 2 mg once weekly. 05/23/21 CMP stable, with slightly elevated ALT- 52 She denies RUQ abd pain.  2. Vitamin D deficiency Discussed labs with patient today.  Previously on calcium/vitamin D supplement and OTC vitamin D3 at unknown dosage. On 05/23/2021, vitamin D level - 102 - instructed to stop all OTC vitamin supplements - she was compliant. She denies N/V/muscle weakness.  3. Hemoglobin low On 05/23/2021, H/H 10.9/33.7, been trending down since 7/22. She denies abdominal pain, hematuria, or hematochezia. She does not have menstrual cycles.  4. Low serum thyroid stimulating hormone (TSH) She denies recent palpitations. She reports increase in sweating. 04/07/21- ED visit for CP She is followed by Cardiology - needs to complete stress test.  5. Other fatigue She reports increased energy level last several weeks. 05/23/21 Labs: B12 >2000. Vitamin D - 102. H/H 10.9/33.7.  6. At risk for impaired function of liver Mackenzie [name] is at risk for impaired function of liver due to elevated  ALT.  Assessment/Plan:   1. Pre-diabetes Refill Ozempic 2 mg once weekly.  - Refill Semaglutide, 2 MG/DOSE, (OZEMPIC, 2 MG/DOSE,) 8 MG/3ML SOPN; Inject 2 mg into the skin once a week.  Dispense: 9 mL; Refill: 0  2. Vitamin D deficiency Check labs in 2 months. Remain off all vitamin D supplements.  3. Hemoglobin low Follow-up with PCP.  4. Low serum thyroid stimulating hormone (TSH) F/u with Endocrinology, referral completed. If cardiac symptoms develop, seek immediate medical assistance.  Patient verbalised understanding/agreement.  5. Other fatigue Monitor labs.  6. At risk for impaired function of liver Mackenzie [name] was given approximately 15 minutes of counseling today regarding prevention of impaired liver function. Mackenzie [name] was educated about her risk of developing NASH or even liver failure and advised that the only proven treatment for NAFLD was weight loss of at least 5-10% of body weight.    7. Obesity: Current BMI 37.6  Mackenzie [name] is currently in the action stage of change. As such, her goal is to continue with weight loss efforts. She has agreed to the Category 3 Plan.   1) Endocrinology referral has been placed - awaiting new patient appointment. 2) She needs to schedule cardiac stress test, per Cardiology. 3) If cardiac symptoms develop, seek immediate medical assistance.  Patient verbalised understanding/agreement.  Exercise goals:  As is.  Behavioral modification strategies: increasing lean protein intake, decreasing simple carbohydrates, meal planning and cooking strategies, keeping healthy foods in the home, and planning for success.  Mackenzie [name] has agreed to follow-up with our clinic in 2 weeks. She was informed of the importance of frequent  follow-up visits to maximize her success with intensive lifestyle modifications for her multiple health conditions.   Objective:   Blood pressure 121/68, pulse (!) 108, temperature 98 F (36.7 C), height 5\' 5"  (1.651 m), weight  225 lb (102.1 kg), last menstrual period 02/10/2017, SpO2 99 %, unknown if currently breastfeeding. Body mass index is 37.44 kg/m.  General: Cooperative, alert, well developed, in no acute distress. HEENT: Conjunctivae and lids unremarkable. Cardiovascular: Regular rhythm.  Lungs: Normal work of breathing. Neurologic: No focal deficits.   Lab Results  Component Value Date   CREATININE 0.44 (L) 05/23/2021   BUN 13 05/23/2021   NA 141 05/23/2021   K 3.5 05/23/2021   CL 105 05/23/2021   CO2 22 05/23/2021   Lab Results  Component Value Date   ALT 52 (H) 05/23/2021   AST 40 05/23/2021   GGT 19 03/01/2021   ALKPHOS 97 05/23/2021   BILITOT 0.3 05/23/2021   Lab Results  Component Value Date   HGBA1C 5.4 05/23/2021   HGBA1C 5.4 02/27/2021   HGBA1C 5.4 11/15/2020   HGBA1C 5.1 06/27/2020   HGBA1C 5.6 01/09/2020   Lab Results  Component Value Date   INSULIN 15.1 05/23/2021   INSULIN 15.1 02/27/2021   INSULIN 11.7 11/15/2020   INSULIN 13.3 01/11/2020   INSULIN 12.6 08/09/2019   Lab Results  Component Value Date   TSH <0.005 (L) 05/23/2021   Lab Results  Component Value Date   CHOL 202 (H) 11/15/2020   HDL 58 11/15/2020   LDLCALC 135 (H) 11/15/2020   TRIG 52 11/15/2020   CHOLHDL 3.5 11/15/2020   Lab Results  Component Value Date   VD25OH 102.0 (H) 05/23/2021   VD25OH 83.0 02/27/2021   VD25OH 74.7 11/15/2020   Lab Results  Component Value Date   WBC 3.2 (L) 05/23/2021   HGB 10.9 (L) 05/23/2021   HCT 33.7 (L) 05/23/2021   MCV 86 05/23/2021   PLT 207 05/23/2021   Attestation Statements:   Reviewed by clinician on day of visit: allergies, medications, problem list, medical history, surgical history, family history, social history, and previous encounter notes.  I, Water quality scientist, CMA, am acting as Location manager for [name], NP.  I have reviewed the above documentation for accuracy and completeness, and I agree with the above. -  Sarabeth Benton d. Mandolin Falwell, NP-C

## 2021-06-13 ENCOUNTER — Encounter: Payer: Self-pay | Admitting: Rehabilitative and Restorative Service Providers"

## 2021-06-13 ENCOUNTER — Ambulatory Visit (INDEPENDENT_AMBULATORY_CARE_PROVIDER_SITE_OTHER): Payer: 59 | Admitting: Rehabilitative and Restorative Service Providers"

## 2021-06-13 DIAGNOSIS — R262 Difficulty in walking, not elsewhere classified: Secondary | ICD-10-CM

## 2021-06-13 DIAGNOSIS — M25561 Pain in right knee: Secondary | ICD-10-CM

## 2021-06-13 DIAGNOSIS — M25562 Pain in left knee: Secondary | ICD-10-CM

## 2021-06-13 DIAGNOSIS — M6281 Muscle weakness (generalized): Secondary | ICD-10-CM

## 2021-06-13 DIAGNOSIS — G8929 Other chronic pain: Secondary | ICD-10-CM

## 2021-06-13 DIAGNOSIS — R600 Localized edema: Secondary | ICD-10-CM

## 2021-06-13 NOTE — Therapy (Signed)
[facility] Physical Therapy [address] [place], [place], [address] Phone: [phone]   Fax:  [phone]  Physical Therapy Treatment  Patient Details  Name: Mackenzie [name] MRN: [id number] Date of Birth: [birth year] Referring Provider (PT): Frankey Shown MD   Encounter Date: 06/13/2021   PT End of Session - 06/13/21 1645     Visit Number 10    Number of Visits 15    Date for PT Re-Evaluation 07/05/21    Authorization Type [employer]    PT Start Time [phone]    PT Stop Time 0845    PT Time Calculation (min) 30 min    Activity Tolerance Patient tolerated treatment well;No increased pain    Behavior During Therapy [facility] for tasks assessed/performed             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   Allergic rhinitis 2012   Anemia 01/27/2017   prior to starting chemotherapy   Breast cancer ([place]) 01/14/2017   Left breast   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, left ([place]) 01/23/2017   Edema, lower extremity    Hot flashes 03/2017   Hyperlipidemia 03/20/2016   Morbid obesity with body mass index (BMI) of 40.0 to 49.9 (HCC) 09/17/2017   NCGS (non-celiac gluten sensitivity)    Pre-diabetes 07/26/2018   Hgb A1C elevated on 07/26/2018, Gestational Diabetes 2012   Seasonal allergies 2012   seasonal allergies causes allergic rhinitis and itchy, dry eyes per pt   Vitamin D deficiency 05/2015    Past Surgical History:  Procedure Laterality Date   BREAST RECONSTRUCTION WITH PLACEMENT OF TISSUE EXPANDER AND FLEX HD (ACELLULAR HYDRATED DERMIS) Left 08/27/2017   Procedure: LEFT BREAST RECONSTRUCTION WITH PLACEMENT OF TISSUE EXPANDER AND FLEX HD;  Surgeon: Wallace Going, DO;  Location: [place];  Service: Plastics;  Laterality: Left;   CESAREAN SECTION     x2   LATISSIMUS FLAP TO BREAST Left 11/03/2018   LATISSIMUS FLAP TO BREAST Left 11/03/2018   Procedure:  LATISSIMUS FLAP TO LEFT BREAST;  Surgeon: Wallace Going, DO;  Location: [place];  Service: Plastics;  Laterality: Left;   MASTECTOMY MODIFIED RADICAL Left 08/27/2017   MASTECTOMY MODIFIED RADICAL Left 08/27/2017   Procedure: LEFT MODIFIED RADICAL MASTECTOMY;  Surgeon: Erroll Luna, MD;  Location: [place];  Service: General;  Laterality: Left;   MASTOPEXY Right 03/03/2019   Procedure: RIGHT BREAST MASTOPEXY/REDUCTION;  Surgeon: Wallace Going, DO;  Location: [place];  Service: Plastics;  Laterality: Right;  3 hours, please   PORT-A-CATH REMOVAL Right 08/27/2017   Procedure: REMOVAL PORT-A-CATH RIGHT CHEST;  Surgeon: Erroll Luna, MD;  Location: [place];  Service: General;  Laterality: Right;   PORTACATH PLACEMENT Right 01/28/2017   Procedure: INSERTION PORT-A-CATH WITH ULTRASOUND;  Surgeon: Erroll Luna, MD;  Location: [place];  Service: General;  Laterality: Right;   REDUCTION MAMMAPLASTY Right    REMOVAL OF TISSUE EXPANDER AND PLACEMENT OF IMPLANT Left 03/03/2019   Procedure: REMOVAL OF TISSUE EXPANDER AND PLACEMENT OF EXPANDER;  Surgeon: Wallace Going, DO;  Location: [place];  Service: Plastics;  Laterality: Left;   REMOVAL OF TISSUE EXPANDER AND PLACEMENT OF IMPLANT Left 05/25/2019   Procedure: LEFT BREAST REMOVAL OF TISSUE EXPANDER AND PLACEMENT OF IMPLANT;  Surgeon: Wallace Going, DO;  Location: [place];  Service: Plastics;  Laterality: Left;  TISSUE EXPANDER PLACEMENT Left 11/03/2018   Procedure: PLACEMENT OF TISSUE EXPANDER LEFT BREAST;  Surgeon: Wallace Going, DO;  Location: [place];  Service: Plastics;  Laterality: Left;  Total case time is 3.5 hours   TUBAL LIGATION Bilateral 01/21/2011    There were no vitals filed for this visit.   Subjective Assessment - 06/13/21 0817     Subjective Mackenzie [name] reports 2X HEP compliance over the past week.  Start-up stiffness is most limiting.     Pertinent History h/o lymphedema in left UE, breast CA,    Limitations Sitting    How long can you sit comfortably? 20-30 minutes before start-up stiffness    Patient Stated Goals Walk up and down steps without pain    Currently in Pain? No/denies    Pain Score 0-No pain    Pain Location Knee    Pain Orientation Left;Right    Pain Descriptors / Indicators Aching    Pain Type Chronic pain    Pain Onset More than a month ago    Pain Frequency Intermittent    Aggravating Factors  Stairs are most functionally limiting, start up stiffness    Pain Relieving Factors Tylenol PRN    Effect of Pain on Daily Activities Limits stairs and WB function    Multiple Pain Sites No                               [facility] Adult PT Treatment/Exercise - 06/13/21 0001       Therapeutic Activites    Therapeutic Activities ADL's    ADL's Step-down off 3 inch step 3 inch step slow eccentrics and avoid hyperextension 2 sets of 10 B, leg press for stairs & sit to stand      Neuro Re-ed    Neuro Re-ed Details  Tandem stance, eyes open/head turning/eyes closed 3X 20 seconds each      Knee/Hip Exercises: Machines for Strengthening   Cybex Knee Extension Double leg 2 sets of 10 20# 90-30 degrees and slow eccentrics    Total Gym Leg Press Single leg full extension (avoid hyperextension) 15X each leg 75# slow eccentrics      Knee/Hip Exercises: Seated   Sit to Sand 5 reps;with UE support                     PT Education - 06/13/21 1644     Education Details Focus on function and stability in the clinic and with HEP.    Person(s) Educated Patient    Methods Explanation;Demonstration;Verbal cues    Comprehension Verbalized understanding;Need further instruction;Returned demonstration;Verbal cues required              PT Short Term Goals - 06/13/21 1645       PT SHORT TERM GOAL #1   Title Pt will be independent in her initial HEP.    Status Achieved      PT SHORT TERM  GOAL #2   Title Pt will be able to perform sit to stand in </= 14 seconds NO UE support.    Baseline 12 seconds on 05/14/2021    Status Achieved               PT Long Term Goals - 05/24/21 0840       PT LONG TERM GOAL #1   Title Pt will be independent in her advanced HEP.    Status On-going  Target Date 07/05/21      PT LONG TERM GOAL #2   Title Pt will be able to amb community distances >/= 20 minutes with pain </= 3/10 in bilateral knees.    Status Achieved      PT LONG TERM GOAL #3   Title Pt will improve her bilateral knee strength to grossly 5/5 in order to improve functional mobility.    Baseline 12/16: 4/5    Status On-going    Target Date 07/05/21      PT LONG TERM GOAL #4   Title Pt will be able to navigate 1 flight stairs with step over step gait pattern with single hand rail with pain </= 3/10 in bilateral knees.    Baseline 12/16: still using step to pattern    Status On-going    Target Date 07/05/21      PT LONG TERM GOAL #5   Title Pt will improve her FOTO to >/= 73 %    Status Achieved                   Plan - 06/13/21 1646     Clinical Impression Statement Mackenzie [name] did a good job with her activities in the office and will benefit from more consistent HEP compliance (2X/week, minimum 3X/week recommended).  Improved quadriceps strength will make a big difference in her knee pain and function.  Continue POC to meet LTGs.    Personal Factors and Comorbidities Comorbidity 3+    Comorbidities lymphedema, breat cancer, anemia, C-section, mastectomy, breast reconstruction, pre-diabetic    Examination-Activity Limitations Lift;Stand;Stairs;Squat;Transfers    Examination-Participation Restrictions Community Activity;Other;Occupation;Yard Work    Stability/Clinical Decision Making Stable/Uncomplicated    Rehab Potential Good    PT Frequency 1x / week    PT Duration 6 weeks    PT Treatment/Interventions ADLs/Self Care Home  Management;Cryotherapy;Balance training;Therapeutic exercise;Therapeutic activities;Functional mobility training;Stair training;Gait training;Neuromuscular re-education;Ultrasound;Patient/family education;Passive range of motion;Manual techniques;Taping;Joint Manipulations;Vasopneumatic Device    PT Next Visit Plan bike/nustep, quadriceps strengthening; dynamic balance; stairs    PT Home Exercise Plan Access Code: [email or web address]    Consulted and Agree with Plan of Care Patient             Patient will benefit from skilled therapeutic intervention in order to improve the following deficits and impairments:  Pain, Decreased range of motion, Difficulty walking, Obesity, Decreased activity tolerance, Impaired flexibility, Postural dysfunction, Decreased strength, Decreased mobility, Increased edema  Visit Diagnosis: Difficulty in walking, not elsewhere classified  Localized edema  Chronic pain of left knee  Chronic pain of right knee  Muscle weakness (generalized)     Problem List Patient Active Problem List   Diagnosis Date Noted   Low serum thyroid stimulating hormone (TSH) 06/12/2021   Other fatigue 05/23/2021   Hemoglobin low 05/23/2021   Polyphagia 05/16/2020   Vitamin D deficiency 05/16/2020   At risk for osteoporosis 05/16/2020   Prediabetes 05/01/2020   Elevated blood pressure reading 05/01/2020   At risk for impaired metabolic function 60/03/9322   Breast asymmetry following reconstructive surgery 09/20/2019   Benign essential HTN 08/17/2018   Class 3 severe obesity with serious comorbidity and body mass index (BMI) of 40.0 to 44.9 in adult [facility]) 09/17/2017   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   Malignant neoplasm of upper-inner quadrant of left breast in female, estrogen receptor positive ([place]) 07/27/2017   Genetic testing 03/25/2017   Malignant 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, estrogen receptor positive ([place]) 01/23/2017  Carcinoma of  breast 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, left ([place]) 01/23/2017    Farley Ly, PT, MPT 06/13/2021, 4:48 PM  [facility] Physical Therapy [address] [place], [place], [address] Phone: [phone]   Fax:  [phone]  Name: Mackenzie [name] MRN: [id number] Date of Birth: Sep 01, 1970

## 2021-06-13 NOTE — Patient Instructions (Addendum)
Access Code: [email or web address] URL: [email or web address] Date: 06/13/2021 Prepared by: Vista Mink  Encouraged sit to stand and step-down (both with hands PRN) and balance with HEP

## 2021-06-18 ENCOUNTER — Telehealth: Payer: Self-pay | Admitting: *Deleted

## 2021-06-18 NOTE — Telephone Encounter (Signed)
[name] 30 Day Post Ribociclib Follow Up Call: Left message requesting patient call research nurse to complete study required safety follow up.  Foye Spurling, BSN, RN, [employer] Clinical Research Nurse 06/18/2021 9:50 AM

## 2021-06-19 ENCOUNTER — Telehealth: Payer: Self-pay | Admitting: *Deleted

## 2021-06-19 ENCOUNTER — Encounter: Payer: Self-pay | Admitting: Oncology

## 2021-06-19 NOTE — Telephone Encounter (Signed)
[name]: LVM for patient requesting call back to complete 30 day post ribociclib follow up call on study.  Foye Spurling, BSN, RN, [employer] Clinical Research Nurse 06/19/2021 10:43 AM

## 2021-06-20 ENCOUNTER — Telehealth: Payer: Self-pay | Admitting: *Deleted

## 2021-06-20 ENCOUNTER — Ambulatory Visit: Payer: 59 | Admitting: Physical Therapy

## 2021-06-20 ENCOUNTER — Encounter: Payer: Self-pay | Admitting: Oncology

## 2021-06-20 ENCOUNTER — Encounter: Payer: Self-pay | Admitting: Physical Therapy

## 2021-06-20 ENCOUNTER — Other Ambulatory Visit: Payer: Self-pay

## 2021-06-20 DIAGNOSIS — M6281 Muscle weakness (generalized): Secondary | ICD-10-CM | POA: Diagnosis not present

## 2021-06-20 DIAGNOSIS — G8929 Other chronic pain: Secondary | ICD-10-CM | POA: Diagnosis not present

## 2021-06-20 DIAGNOSIS — M25561 Pain in right knee: Secondary | ICD-10-CM | POA: Diagnosis not present

## 2021-06-20 DIAGNOSIS — R600 Localized edema: Secondary | ICD-10-CM | POA: Diagnosis not present

## 2021-06-20 DIAGNOSIS — M25562 Pain in left knee: Secondary | ICD-10-CM | POA: Diagnosis not present

## 2021-06-20 DIAGNOSIS — R262 Difficulty in walking, not elsewhere classified: Secondary | ICD-10-CM | POA: Diagnosis not present

## 2021-06-20 NOTE — Therapy (Signed)
[place], [place], [address] Phone: [phone]   Fax:  [phone]  Physical Therapy Treatment  Patient Details  Name: Mackenzie [name] MRN: [id number] Date of Birth: Feb 27, 1971 Referring Provider (PT): Frankey Shown MD   Encounter Date: 06/20/2021   PT End of Session - 06/20/21 0838     Visit Number 11    Number of Visits 15    Date for PT Re-Evaluation 07/05/21    Authorization Type [employer]    PT Start Time 0803    PT Stop Time [phone]    PT Time Calculation (min) 38 min    Activity Tolerance Patient tolerated treatment well;No increased pain    Behavior During Therapy [facility] for tasks assessed/performed             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   Allergic rhinitis 2012   Anemia 01/27/2017   prior to starting chemotherapy   Breast cancer ([place]) 01/14/2017   Left breast   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, left ([place]) 01/23/2017   Edema, lower extremity    Hot flashes 03/2017   Hyperlipidemia 03/20/2016   Morbid obesity with body mass index (BMI) of 40.0 to 49.9 (HCC) 09/17/2017   NCGS (non-celiac gluten sensitivity)    Pre-diabetes 07/26/2018   Hgb A1C elevated on 07/26/2018, Gestational Diabetes 2012   Seasonal allergies 2012   seasonal allergies causes allergic rhinitis and itchy, dry eyes per pt   Vitamin D deficiency 05/2015    Past Surgical History:  Procedure Laterality Date   BREAST RECONSTRUCTION WITH PLACEMENT OF TISSUE EXPANDER AND FLEX HD (ACELLULAR HYDRATED DERMIS) Left 08/27/2017   Procedure: LEFT BREAST RECONSTRUCTION WITH PLACEMENT OF TISSUE EXPANDER AND FLEX HD;  Surgeon: Wallace Going, DO;  Location: [place];  Service: Plastics;  Laterality: Left;   CESAREAN SECTION     x2   LATISSIMUS FLAP TO BREAST Left 11/03/2018   LATISSIMUS FLAP TO BREAST Left 11/03/2018   Procedure:  LATISSIMUS FLAP TO LEFT BREAST;  Surgeon: Wallace Going, DO;  Location: [place];  Service: Plastics;  Laterality: Left;   MASTECTOMY MODIFIED RADICAL Left 08/27/2017   MASTECTOMY MODIFIED RADICAL Left 08/27/2017   Procedure: LEFT MODIFIED RADICAL MASTECTOMY;  Surgeon: Erroll Luna, MD;  Location: [place];  Service: General;  Laterality: Left;   MASTOPEXY Right 03/03/2019   Procedure: RIGHT BREAST MASTOPEXY/REDUCTION;  Surgeon: Wallace Going, DO;  Location: [place];  Service: Plastics;  Laterality: Right;  3 hours, please   PORT-A-CATH REMOVAL Right 08/27/2017   Procedure: REMOVAL PORT-A-CATH RIGHT CHEST;  Surgeon: Erroll Luna, MD;  Location: [place];  Service: General;  Laterality: Right;   PORTACATH PLACEMENT Right 01/28/2017   Procedure: INSERTION PORT-A-CATH WITH ULTRASOUND;  Surgeon: Erroll Luna, MD;  Location: [place];  Service: General;  Laterality: Right;   REDUCTION MAMMAPLASTY Right    REMOVAL OF TISSUE EXPANDER AND PLACEMENT OF IMPLANT Left 03/03/2019   Procedure: REMOVAL OF TISSUE EXPANDER AND PLACEMENT OF EXPANDER;  Surgeon: Wallace Going, DO;  Location: [place];  Service: Plastics;  Laterality: Left;   REMOVAL OF TISSUE EXPANDER AND PLACEMENT OF IMPLANT Left 05/25/2019   Procedure: LEFT BREAST REMOVAL OF TISSUE EXPANDER AND PLACEMENT OF IMPLANT;  Surgeon: Wallace Going, DO;  Location: [place];  Service: Plastics;  Laterality: Left;  TISSUE EXPANDER PLACEMENT Left 11/03/2018   Procedure: PLACEMENT OF TISSUE EXPANDER LEFT BREAST;  Surgeon: Wallace Going, DO;  Location: [place];  Service: Plastics;  Laterality: Left;  Total case time is 3.5 hours   TUBAL LIGATION Bilateral 01/21/2011    There were no vitals filed for this visit.   Subjective Assessment - 06/20/21 0808     Subjective doing pretty well; knee pain is improving    Pertinent History h/o lymphedema in left UE, breast  CA,    Limitations Sitting    How long can you sit comfortably? 20-30 minutes before start-up stiffness    Patient Stated Goals Walk up and down steps without pain    Currently in Pain? No/denies                               [facility] Adult PT Treatment/Exercise - 06/20/21 0809       Ambulation/Gait   Gait Comments discussed strategies to negotiate stairs reciprocally, pt performed 8-10 steps with mod cues.      Knee/Hip Exercises: Aerobic   Recumbent Bike L5 x 8 min      Knee/Hip Exercises: Machines for Strengthening   Cybex Knee Extension 20# 3x10; bil; with focus on eccentric control    Cybex Knee Flexion DL 35# 3x10    Total Gym Leg Press SL 62# 2x10; cues for eccentric control      Knee/Hip Exercises: Standing   Step Down Limitations lateral heel tap 2x10 bil off 4" step                       PT Short Term Goals - 06/13/21 1645       PT SHORT TERM GOAL #1   Title Pt will be independent in her initial HEP.    Status Achieved      PT SHORT TERM GOAL #2   Title Pt will be able to perform sit to stand in </= 14 seconds NO UE support.    Baseline 12 seconds on 05/14/2021    Status Achieved               PT Long Term Goals - 05/24/21 0840       PT LONG TERM GOAL #1   Title Pt will be independent in her advanced HEP.    Status On-going    Target Date 07/05/21      PT LONG TERM GOAL #2   Title Pt will be able to amb community distances >/= 20 minutes with pain </= 3/10 in bilateral knees.    Status Achieved      PT LONG TERM GOAL #3   Title Pt will improve her bilateral knee strength to grossly 5/5 in order to improve functional mobility.    Baseline 12/16: 4/5    Status On-going    Target Date 07/05/21      PT LONG TERM GOAL #4   Title Pt will be able to navigate 1 flight stairs with step over step gait pattern with single hand rail with pain </= 3/10 in bilateral knees.    Baseline 12/16: still using step to pattern     Status On-going    Target Date 07/05/21      PT LONG TERM GOAL #5   Title Pt will improve her FOTO to >/= 73 %    Status Achieved  Plan - 06/20/21 5003     Clinical Impression Statement Pt tolerated session well today with work on stairs and strategies to help with reciprocal gait descending stairs.  Will continue to benefit from PT to maximize function.  Anticipate she is nearing d/c.    Personal Factors and Comorbidities Comorbidity 3+    Comorbidities lymphedema, breat cancer, anemia, C-section, mastectomy, breast reconstruction, pre-diabetic    Examination-Activity Limitations Lift;Stand;Stairs;Squat;Transfers    Examination-Participation Restrictions Community Activity;Other;Occupation;Yard Work    Stability/Clinical Decision Making Stable/Uncomplicated    Rehab Potential Good    PT Frequency 1x / week    PT Duration 6 weeks    PT Treatment/Interventions ADLs/Self Care Home Management;Cryotherapy;Balance training;Therapeutic exercise;Therapeutic activities;Functional mobility training;Stair training;Gait training;Neuromuscular re-education;Ultrasound;Patient/family education;Passive range of motion;Manual techniques;Taping;Joint Manipulations;Vasopneumatic Device    PT Next Visit Plan bike/nustep, quadriceps strengthening; dynamic balance; stairs - continue PRN; work on finalizing [place] Access Code: [place] and Agree with Plan of Care Patient             Patient will benefit from skilled therapeutic intervention in order to improve the following deficits and impairments:  Pain, Decreased range of motion, Difficulty walking, Obesity, Decreased activity tolerance, Impaired flexibility, Postural dysfunction, Decreased strength, Decreased mobility, Increased edema  Visit Diagnosis: Difficulty in walking, not elsewhere classified  Localized edema  Chronic pain of left knee  Chronic pain of right knee  Muscle  weakness (generalized)     Problem List Patient Active Problem List   Diagnosis Date Noted   Low serum thyroid stimulating hormone (TSH) 06/12/2021   Other fatigue 05/23/2021   Hemoglobin low 05/23/2021   Polyphagia 05/16/2020   Vitamin D deficiency 05/16/2020   At risk for osteoporosis 05/16/2020   Prediabetes 05/01/2020   Elevated blood pressure reading 05/01/2020   At risk for impaired metabolic function 70/48/8891   Breast asymmetry following reconstructive surgery 09/20/2019   Benign essential HTN 08/17/2018   Class 3 severe obesity with serious comorbidity and body mass index (BMI) of 40.0 to 44.9 in adult [facility]) 09/17/2017   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   Malignant neoplasm of upper-inner quadrant of left breast in female, estrogen receptor positive ([place]) 07/27/2017   Genetic testing 03/25/2017   Malignant 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, estrogen receptor positive ([place]) 01/23/2017   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

## 2021-06-20 NOTE — Telephone Encounter (Addendum)
[name]: Left message requesting patient call back to complete the 30 day post ribociclib safety follow up on study.  Foye Spurling, BSN, RN, CCRP Clinical Research Nurse 06/20/2021 10:06 AM  30 Day Post Ribociclib Safety Follow Up:  Patient returned call, she has been at work.  Patient does not report any new or worsening AEs since last study visit and all previously reported AEs are unchanged. She reports she had fungus on bilateral big toes.  The left toenail was removed but she still has fungus on the right big toenail. Patient has not had any SAEs or hospitalizations since last study visit. She states she has not gotten appointment with Endocrinologist yet but will call them again today or tomorrow. She doesn't notice any symptoms of hyperthyroidism. Reminded patient of next study visit with Dr. Chryl Heck scheduled for 08/20/21 with labs (to monitor hormone levels). Will contact patient again prior to appointment with reminder call. Thanked patient for her time today. She verbalized understanding.  Foye Spurling, BSN, RN, [employer] Clinical Research Nurse 06/20/2021 2:48 PM

## 2021-06-26 ENCOUNTER — Ambulatory Visit: Payer: 59 | Admitting: Physical Therapy

## 2021-06-26 ENCOUNTER — Other Ambulatory Visit: Payer: Self-pay

## 2021-06-26 ENCOUNTER — Encounter: Payer: Self-pay | Admitting: Physical Therapy

## 2021-06-26 DIAGNOSIS — G8929 Other chronic pain: Secondary | ICD-10-CM

## 2021-06-26 DIAGNOSIS — M25562 Pain in left knee: Secondary | ICD-10-CM | POA: Diagnosis not present

## 2021-06-26 DIAGNOSIS — M25561 Pain in right knee: Secondary | ICD-10-CM | POA: Diagnosis not present

## 2021-06-26 DIAGNOSIS — R600 Localized edema: Secondary | ICD-10-CM | POA: Diagnosis not present

## 2021-06-26 DIAGNOSIS — M6281 Muscle weakness (generalized): Secondary | ICD-10-CM | POA: Diagnosis not present

## 2021-06-26 DIAGNOSIS — R262 Difficulty in walking, not elsewhere classified: Secondary | ICD-10-CM

## 2021-06-26 NOTE — Patient Instructions (Signed)
Access Code: [place] URL: [email or web address] Date: 06/26/2021 Prepared by: Faustino Congress  Exercises Supine Bridge with [employer] Between Knees - 2 x daily - 7 x weekly - 2 sets - 10 reps - 5 seconds hold Seated Hamstring Stretch - 2 x daily - 7 x weekly - 6 reps - 30 seconds hold Heel Toe Raises with Unilateral Counter Support - 2 x daily - 7 x weekly - 2 sets - 10 reps Supine Active Straight Leg Raise - 2 x daily - 7 x weekly - 4 sets - 10 reps - 2-3 seconds hold Squat with Chair Touch - 2 x daily - 7 x weekly - 10 reps Clamshell with Resistance - 1 x daily - 7 x weekly - 4 sets - 10 reps Seated Straight Leg Raise - 1 x daily - 7 x weekly - 3 sets - 10 reps Kettlebell Deadlift - 1 x daily - 7 x weekly - 3 sets - 10 reps Forward and Backward Walking Lunge in [place] Hamstring Stretch with Noodle - 20 seconds hold Side Stepping Standing Hip Abduction Adduction at [employer] - 2 sets - 10 reps Standing Hip Flexion Extension at [employer] - 2 sets - 10 reps Squat - 2 sets - 10 reps Knee Extension with Weight Machine - 1 x daily - 3-4 x weekly - 3 sets - 10 reps Single Leg Knee Extension with Weight Machine - 1 x daily - 3-4 x weekly - 3 sets - 10 reps Hamstring Curl with Weight Machine - 1 x daily - 3-4 x weekly - 3 sets - 10 reps Single Leg Hamstring Curl with Weight Machine - 1 x daily - 3-4 x weekly - 3 sets - 10 reps Full Leg Press - 1 x daily - 3-4 x weekly - 3 sets - 10 reps Single Leg Press - 1 x daily - 3-4 x weekly - 3 sets - 10 reps Step Up - 1 x daily - 7 x weekly - 3 sets - 10 reps Lateral Step Down - 1 x daily - 7 x weekly - 3 sets - 10 reps

## 2021-06-26 NOTE — Therapy (Signed)
[place], [place], [address] Phone: [phone]   Fax:  [phone]  Physical Therapy Treatment  Patient Details  Name: Mackenzie [name] MRN: [id number] Date of Birth: 1970/09/18 Referring Provider (PT): Frankey Shown MD   Encounter Date: 06/26/2021   PT End of Session - 06/26/21 0100     Visit Number 12    Number of Visits 15    Date for PT Re-Evaluation 07/05/21    Authorization Type [employer]    PT Start Time 0805    PT Stop Time [phone]    PT Time Calculation (min) 38 min    Activity Tolerance Patient tolerated treatment well;No increased pain    Behavior During Therapy [facility] for tasks assessed/performed             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   Allergic rhinitis 2012   Anemia 01/27/2017   prior to starting chemotherapy   Breast cancer ([place]) 01/14/2017   Left breast   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, left ([place]) 01/23/2017   Edema, lower extremity    Hot flashes 03/2017   Hyperlipidemia 03/20/2016   Morbid obesity with body mass index (BMI) of 40.0 to 49.9 (HCC) 09/17/2017   NCGS (non-celiac gluten sensitivity)    Pre-diabetes 07/26/2018   Hgb A1C elevated on 07/26/2018, Gestational Diabetes 2012   Seasonal allergies 2012   seasonal allergies causes allergic rhinitis and itchy, dry eyes per pt   Vitamin D deficiency 05/2015    Past Surgical History:  Procedure Laterality Date   BREAST RECONSTRUCTION WITH PLACEMENT OF TISSUE EXPANDER AND FLEX HD (ACELLULAR HYDRATED DERMIS) Left 08/27/2017   Procedure: LEFT BREAST RECONSTRUCTION WITH PLACEMENT OF TISSUE EXPANDER AND FLEX HD;  Surgeon: Wallace Going, DO;  Location: [place];  Service: Plastics;  Laterality: Left;   CESAREAN SECTION     x2   LATISSIMUS FLAP TO BREAST Left 11/03/2018   LATISSIMUS FLAP TO BREAST Left 11/03/2018   Procedure:  LATISSIMUS FLAP TO LEFT BREAST;  Surgeon: Wallace Going, DO;  Location: [place];  Service: Plastics;  Laterality: Left;   MASTECTOMY MODIFIED RADICAL Left 08/27/2017   MASTECTOMY MODIFIED RADICAL Left 08/27/2017   Procedure: LEFT MODIFIED RADICAL MASTECTOMY;  Surgeon: Erroll Luna, MD;  Location: [place];  Service: General;  Laterality: Left;   MASTOPEXY Right 03/03/2019   Procedure: RIGHT BREAST MASTOPEXY/REDUCTION;  Surgeon: Wallace Going, DO;  Location: [place];  Service: Plastics;  Laterality: Right;  3 hours, please   PORT-A-CATH REMOVAL Right 08/27/2017   Procedure: REMOVAL PORT-A-CATH RIGHT CHEST;  Surgeon: Erroll Luna, MD;  Location: [place];  Service: General;  Laterality: Right;   PORTACATH PLACEMENT Right 01/28/2017   Procedure: INSERTION PORT-A-CATH WITH ULTRASOUND;  Surgeon: Erroll Luna, MD;  Location: [place];  Service: General;  Laterality: Right;   REDUCTION MAMMAPLASTY Right    REMOVAL OF TISSUE EXPANDER AND PLACEMENT OF IMPLANT Left 03/03/2019   Procedure: REMOVAL OF TISSUE EXPANDER AND PLACEMENT OF EXPANDER;  Surgeon: Wallace Going, DO;  Location: [name];  Service: Plastics;  Laterality: Left;   REMOVAL OF TISSUE EXPANDER AND PLACEMENT OF IMPLANT Left 05/25/2019   Procedure: LEFT BREAST REMOVAL OF TISSUE EXPANDER AND PLACEMENT OF IMPLANT;  Surgeon: Wallace Going, DO;  Location: [place];  Service: Plastics;  Laterality: Left;  TISSUE EXPANDER PLACEMENT Left 11/03/2018   Procedure: PLACEMENT OF TISSUE EXPANDER LEFT BREAST;  Surgeon: Wallace Going, DO;  Location: [place];  Service: Plastics;  Laterality: Left;  Total case time is 3.5 hours   TUBAL LIGATION Bilateral 01/21/2011    There were no vitals filed for this visit.   Subjective Assessment - 06/26/21 0807     Subjective doing well; reports a little pain in Lt knee; worked out with a Physiological scientist yesterday     Pertinent History h/o lymphedema in left UE, breast CA,    Limitations Sitting    How long can you sit comfortably? 20-30 minutes before start-up stiffness    Patient Stated Goals Walk up and down steps without pain    Currently in Pain? Yes    Pain Score 2     Pain Location Knee    Pain Orientation Left;Right    Pain Descriptors / Indicators Aching    Pain Type Chronic pain    Pain Onset More than a month ago    Pain Frequency Intermittent    Aggravating Factors  stairs    Pain Relieving Factors tylenol PRN                               [facility] Adult PT Treatment/Exercise - 06/26/21 0808       Knee/Hip Exercises: Aerobic   Nustep L6 x 8 min UE/LEs      Knee/Hip Exercises: Machines for Strengthening   Cybex Knee Extension 20# 3x10; bil; with focus on eccentric control      Knee/Hip Exercises: Standing   Forward Step Up Both;10 reps;Hand Hold: 0;Step Height: 6";2 sets    Forward Step Up Limitations cues for full knee extension before bringing other foot up on step    Step Down Limitations lateral heel tap 2x10 bil off 4" step                     PT Education - 06/26/21 0847     Education Details HEP    Person(s) Educated Patient    Methods Explanation;Demonstration;Handout    Comprehension Verbalized understanding;Returned demonstration;Need further instruction              PT Short Term Goals - 06/13/21 1645       PT SHORT TERM GOAL #1   Title Pt will be independent in her initial HEP.    Status Achieved      PT SHORT TERM GOAL #2   Title Pt will be able to perform sit to stand in </= 14 seconds NO UE support.    Baseline 12 seconds on 05/14/2021    Status Achieved               PT Long Term Goals - 05/24/21 0840       PT LONG TERM GOAL #1   Title Pt will be independent in her advanced HEP.    Status On-going    Target Date 07/05/21      PT LONG TERM GOAL #2   Title Pt will be able to amb community distances >/= 20  minutes with pain </= 3/10 in bilateral knees.    Status Achieved      PT LONG TERM GOAL #3   Title Pt will improve her bilateral knee strength to grossly 5/5 in order to improve functional mobility.    Baseline 12/16: 4/5    Status  On-going    Target Date 07/05/21      PT LONG TERM GOAL #4   Title Pt will be able to navigate 1 flight stairs with step over step gait pattern with single hand rail with pain </= 3/10 in bilateral knees.    Baseline 12/16: still using step to pattern    Status On-going    Target Date 07/05/21      PT LONG TERM GOAL #5   Title Pt will improve her FOTO to >/= 73 %    Status Achieved                   Plan - 06/26/21 0858     Clinical Impression Statement Pt concerned about stairs still so discussed exercises to address quad strength and eccentric quad control as well as added to HEP to help with these concerns.  Feel pt will just need consistent exercise to maximize strength and anticipate she will be ready for d/c next week.    Personal Factors and Comorbidities Comorbidity 3+    Comorbidities lymphedema, breat cancer, anemia, C-section, mastectomy, breast reconstruction, pre-diabetic    Examination-Activity Limitations Lift;Stand;Stairs;Squat;Transfers    Examination-Participation Restrictions Community Activity;Other;Occupation;Yard Work    Stability/Clinical Decision Making Stable/Uncomplicated    Rehab Potential Good    PT Frequency 1x / week    PT Duration 6 weeks    PT Treatment/Interventions ADLs/Self Care Home Management;Cryotherapy;Balance training;Therapeutic exercise;Therapeutic activities;Functional mobility training;Stair training;Gait training;Neuromuscular re-education;Ultrasound;Patient/family education;Passive range of motion;Manual techniques;Taping;Joint Manipulations;Vasopneumatic Device    PT Next Visit Plan check goals and plan for d/c    PT Home Exercise Plan Access Code: [place]    Consulted and Agree with Plan of Care  Patient             Patient will benefit from skilled therapeutic intervention in order to improve the following deficits and impairments:  Pain, Decreased range of motion, Difficulty walking, Obesity, Decreased activity tolerance, Impaired flexibility, Postural dysfunction, Decreased strength, Decreased mobility, Increased edema  Visit Diagnosis: Difficulty in walking, not elsewhere classified  Localized edema  Chronic pain of left knee  Chronic pain of right knee  Muscle weakness (generalized)     Problem List Patient Active Problem List   Diagnosis Date Noted   Low serum thyroid stimulating hormone (TSH) 06/12/2021   Other fatigue 05/23/2021   Hemoglobin low 05/23/2021   Polyphagia 05/16/2020   Vitamin D deficiency 05/16/2020   At risk for osteoporosis 05/16/2020   Prediabetes 05/01/2020   Elevated blood pressure reading 05/01/2020   At risk for impaired metabolic function 42/35/3614   Breast asymmetry following reconstructive surgery 09/20/2019   Benign essential HTN 08/17/2018   Class 3 severe obesity with serious comorbidity and body mass index (BMI) of 40.0 to 44.9 in adult [facility]) 09/17/2017   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   Malignant neoplasm of upper-inner quadrant of left breast in female, estrogen receptor positive ([place]) 07/27/2017   Genetic testing 03/25/2017   Malignant 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, estrogen receptor positive ([place]) 01/23/2017   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

## 2021-06-27 ENCOUNTER — Telehealth (INDEPENDENT_AMBULATORY_CARE_PROVIDER_SITE_OTHER): Payer: Self-pay

## 2021-06-27 ENCOUNTER — Encounter (INDEPENDENT_AMBULATORY_CARE_PROVIDER_SITE_OTHER): Payer: Self-pay | Admitting: Adult Health

## 2021-06-27 ENCOUNTER — Ambulatory Visit (INDEPENDENT_AMBULATORY_CARE_PROVIDER_SITE_OTHER): Payer: 59 | Admitting: Adult Health

## 2021-06-27 VITALS — BP 137/69 | HR 88 | Temp 98.0°F | Ht 65.0 in | Wt 222.0 lb

## 2021-06-27 DIAGNOSIS — Z6837 Body mass index (BMI) 37.0-37.9, adult: Secondary | ICD-10-CM | POA: Diagnosis not present

## 2021-06-27 DIAGNOSIS — R7989 Other specified abnormal findings of blood chemistry: Secondary | ICD-10-CM

## 2021-06-27 DIAGNOSIS — R7303 Prediabetes: Secondary | ICD-10-CM

## 2021-06-27 DIAGNOSIS — Z6841 Body Mass Index (BMI) 40.0 and over, adult: Secondary | ICD-10-CM

## 2021-06-27 NOTE — Telephone Encounter (Signed)
Pt notified that [facility] has been contacted. Per Vaughan Basta the referral has been marked urgent as well as a message sent to the reviewing MD. Vaughan Basta requested the pt give their office a call tomorrow to check on status of appointment. Pt verbalized understanding and will contact us if still having issues getting appointment.  Vegas Coffin LPN

## 2021-07-01 ENCOUNTER — Encounter (INDEPENDENT_AMBULATORY_CARE_PROVIDER_SITE_OTHER): Payer: Self-pay

## 2021-07-01 NOTE — Progress Notes (Signed)
Chief Complaint:   OBESITY Mackenzie [name] is here to discuss her progress with her obesity treatment plan along with follow-up of her obesity related diagnoses. Mackenzie [name] is on the Category 3 Plan and states she is following her eating plan approximately 65% of the time. Mackenzie [name] states she is going to the gym for 30 minutes 3 times per week.  Today's visit was #: 53 Starting weight: 267 lbs Starting date: 08/09/2019 Today's weight: 222 lbs Today's date: 06/27/2021 Total lbs lost to date: 45 lbs Total lbs lost since last in-office visit: 3 lbs  Interim History:  Mackenzie [name]'s 2023 Goals: 1) Continue steady weight loss. Down 45 pounds since establishing with [facility]!  Subjective:   1. Low serum thyroid stimulating hormone (TSH) She has experienced brief, intermittent palpitations. She denies chest pain. Endo has yet to contact her to schedule new patient appointment.  2. Pre-diabetes She is on metformin 500 mg BID, Ozempic 2 mg once weekly. She denies any medication SE and reports well controlled appetite.  Assessment/Plan:   1. Low serum thyroid stimulating hormone (TSH) Call Endo to inquire about scheduling new pt appt ASAP. Seek immediate medical assistance if red flag symptoms develop. Patient verbalized understanding/agreement.  2. Pre-diabetes Continue metformin/Ozempic as directed.  3. Obesity: Current BMI 37.1  Mackenzie [name] is currently in the action stage of change. As such, her goal is to continue with weight loss efforts. She has agreed to the Category 3 Plan.   Exercise goals:  As is.  Behavioral modification strategies: increasing lean protein intake, decreasing simple carbohydrates, meal planning and cooking strategies, keeping healthy foods in the home, and planning for success.  Mackenzie [name] has agreed to follow-up with our clinic in 2-3 weeks. She was informed of the importance of frequent follow-up visits to maximize her success with intensive lifestyle modifications for her  multiple health conditions.   Objective:   Blood pressure 137/69, pulse 88, temperature 98 F (36.7 C), height 5\' 5"  (1.651 m), weight 222 lb (100.7 kg), last menstrual period 02/10/2017, SpO2 99 %, unknown if currently breastfeeding. Body mass index is 36.94 kg/m.  General: Cooperative, alert, well developed, in no acute distress. HEENT: Conjunctivae and lids unremarkable. Cardiovascular: Regular rhythm.  Lungs: Normal work of breathing. Neurologic: No focal deficits.   Lab Results  Component Value Date   CREATININE 0.44 (L) 05/23/2021   BUN 13 05/23/2021   NA 141 05/23/2021   K 3.5 05/23/2021   CL 105 05/23/2021   CO2 22 05/23/2021   Lab Results  Component Value Date   ALT 52 (H) 05/23/2021   AST 40 05/23/2021   GGT 19 03/01/2021   ALKPHOS 97 05/23/2021   BILITOT 0.3 05/23/2021   Lab Results  Component Value Date   HGBA1C 5.4 05/23/2021   HGBA1C 5.4 02/27/2021   HGBA1C 5.4 11/15/2020   HGBA1C 5.1 06/27/2020   HGBA1C 5.6 01/09/2020   Lab Results  Component Value Date   INSULIN 15.1 05/23/2021   INSULIN 15.1 02/27/2021   INSULIN 11.7 11/15/2020   INSULIN 13.3 01/11/2020   INSULIN 12.6 08/09/2019   Lab Results  Component Value Date   TSH <0.005 (L) 05/23/2021   Lab Results  Component Value Date   CHOL 202 (H) 11/15/2020   HDL 58 11/15/2020   LDLCALC 135 (H) 11/15/2020   TRIG 52 11/15/2020   CHOLHDL 3.5 11/15/2020   Lab Results  Component Value Date   VD25OH 102.0 (H) 05/23/2021   VD25OH 83.0 02/27/2021   VD25OH 74.7  11/15/2020   Lab Results  Component Value Date   WBC 3.2 (L) 05/23/2021   HGB 10.9 (L) 05/23/2021   HCT 33.7 (L) 05/23/2021   MCV 86 05/23/2021   PLT 207 05/23/2021   Attestation Statements:   Reviewed by clinician on day of visit: allergies, medications, problem list, medical history, surgical history, family history, social history, and previous encounter notes.  Time spent on visit including pre-visit chart review and  post-visit care and charting was 28 minutes.   I, Water quality scientist, CMA, am acting as Location manager for [name], NP.  I have reviewed the above documentation for accuracy and completeness, and I agree with the above. -  Soma Bachand d. Earlie Arciga, NP-C

## 2021-07-03 ENCOUNTER — Ambulatory Visit: Payer: 59 | Admitting: Physical Therapy

## 2021-07-03 ENCOUNTER — Encounter: Payer: Self-pay | Admitting: Physical Therapy

## 2021-07-03 ENCOUNTER — Other Ambulatory Visit: Payer: Self-pay

## 2021-07-03 DIAGNOSIS — M6281 Muscle weakness (generalized): Secondary | ICD-10-CM | POA: Diagnosis not present

## 2021-07-03 DIAGNOSIS — G8929 Other chronic pain: Secondary | ICD-10-CM

## 2021-07-03 DIAGNOSIS — M25562 Pain in left knee: Secondary | ICD-10-CM | POA: Diagnosis not present

## 2021-07-03 DIAGNOSIS — R600 Localized edema: Secondary | ICD-10-CM

## 2021-07-03 DIAGNOSIS — R262 Difficulty in walking, not elsewhere classified: Secondary | ICD-10-CM | POA: Diagnosis not present

## 2021-07-03 DIAGNOSIS — M25561 Pain in right knee: Secondary | ICD-10-CM | POA: Diagnosis not present

## 2021-07-03 NOTE — Therapy (Signed)
[place], [place], [address] Phone: [phone]   Fax:  [phone]  Physical Therapy Treatment/Discharge Summary  Patient Details  Name: Mackenzie [name] MRN: [id number] Date of Birth: [birth year] Referring Provider (PT): Frankey Shown MD   Encounter Date: 07/03/2021   PT End of Session - 07/03/21 0828     Visit Number 13    Authorization Type [employer]    PT Start Time [phone]    PT Stop Time 0825    PT Time Calculation (min) 15 min    Activity Tolerance Patient tolerated treatment well;No increased pain    Behavior During Therapy [facility] for tasks assessed/performed             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   Allergic rhinitis 2012   Anemia 01/27/2017   prior to starting chemotherapy   Breast cancer ([place]) 01/14/2017   Left breast   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, left ([place]) 01/23/2017   Edema, lower extremity    Hot flashes 03/2017   Hyperlipidemia 03/20/2016   Morbid obesity with body mass index (BMI) of 40.0 to 49.9 (HCC) 09/17/2017   NCGS (non-celiac gluten sensitivity)    Pre-diabetes 07/26/2018   Hgb A1C elevated on 07/26/2018, Gestational Diabetes 2012   Seasonal allergies 2012   seasonal allergies causes allergic rhinitis and itchy, dry eyes per pt   Vitamin D deficiency 05/2015    Past Surgical History:  Procedure Laterality Date   BREAST RECONSTRUCTION WITH PLACEMENT OF TISSUE EXPANDER AND FLEX HD (ACELLULAR HYDRATED DERMIS) Left 08/27/2017   Procedure: LEFT BREAST RECONSTRUCTION WITH PLACEMENT OF TISSUE EXPANDER AND FLEX HD;  Surgeon: Wallace Going, DO;  Location: [place];  Service: Plastics;  Laterality: Left;   CESAREAN SECTION     x2   LATISSIMUS FLAP TO BREAST Left 11/03/2018   LATISSIMUS FLAP TO BREAST Left 11/03/2018   Procedure: LATISSIMUS FLAP TO LEFT BREAST;  Surgeon: Wallace Going, DO;  Location: [place];  Service: Plastics;  Laterality: Left;   MASTECTOMY MODIFIED RADICAL Left 08/27/2017   MASTECTOMY MODIFIED RADICAL Left 08/27/2017   Procedure: LEFT MODIFIED RADICAL MASTECTOMY;  Surgeon: Erroll Luna, MD;  Location: [place];  Service: General;  Laterality: Left;   MASTOPEXY Right 03/03/2019   Procedure: RIGHT BREAST MASTOPEXY/REDUCTION;  Surgeon: Wallace Going, DO;  Location: [place];  Service: Plastics;  Laterality: Right;  3 hours, please   PORT-A-CATH REMOVAL Right 08/27/2017   Procedure: REMOVAL PORT-A-CATH RIGHT CHEST;  Surgeon: Erroll Luna, MD;  Location: [place];  Service: General;  Laterality: Right;   PORTACATH PLACEMENT Right 01/28/2017   Procedure: INSERTION PORT-A-CATH WITH ULTRASOUND;  Surgeon: Erroll Luna, MD;  Location: [place];  Service: General;  Laterality: Right;   REDUCTION MAMMAPLASTY Right    REMOVAL OF TISSUE EXPANDER AND PLACEMENT OF IMPLANT Left 03/03/2019   Procedure: REMOVAL OF TISSUE EXPANDER AND PLACEMENT OF EXPANDER;  Surgeon: Wallace Going, DO;  Location: [place];  Service: Plastics;  Laterality: Left;   REMOVAL OF TISSUE EXPANDER AND PLACEMENT OF IMPLANT Left 05/25/2019   Procedure: LEFT BREAST REMOVAL OF TISSUE EXPANDER AND PLACEMENT OF IMPLANT;  Surgeon: Wallace Going, DO;  Location: [place];  Service: Plastics;  Laterality: Left;   TISSUE EXPANDER PLACEMENT Left 11/03/2018   Procedure: PLACEMENT OF TISSUE EXPANDER LEFT BREAST;  Surgeon: Wallace Going, DO;  Location: [place];  Service: Plastics;  Laterality: Left;  Total case time is 3.5 hours   TUBAL LIGATION Bilateral 01/21/2011    There were no vitals filed for this visit.   Subjective Assessment - 07/03/21 0814     Subjective no complaints, ready for d/c today    Pertinent History h/o lymphedema in left UE, breast CA,    Limitations Sitting    How long can you sit  comfortably? 20-30 minutes before start-up stiffness    Patient Stated Goals Walk up and down steps without pain    Currently in Pain? 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    Referring Provider (PT) Frankey Shown MD      Strength   Right Knee Flexion 5/5    Right Knee Extension 5/5    Left Knee Flexion 5/5    Left Knee Extension 5/5      Ambulation/Gait   Gait Comments negotiated 1 flight of stairs reciprocally mod I with hand rail; reports pain descending 5/10                           [facility] Adult PT Treatment/Exercise - 07/03/21 1470       Neuro Re-ed    Neuro Re-ed Details  discussed and provided balance exercises at pt's request                     PT Education - 07/03/21 0828     Education Details added balance to HEP    Person(s) Educated Patient    Methods Explanation    Comprehension Verbalized understanding              PT Short Term Goals - 06/13/21 1645       PT SHORT TERM GOAL #1   Title Pt will be independent in her initial HEP.    Status Achieved      PT SHORT TERM GOAL #2   Title Pt will be able to perform sit to stand in </= 14 seconds NO UE support.    Baseline 12 seconds on 05/14/2021    Status Achieved               PT Long Term Goals - 07/03/21 9295       PT LONG TERM GOAL #1   Title Pt will be independent in her advanced HEP.    Status Achieved    Target Date 07/05/21      PT LONG TERM GOAL #2   Title Pt will be able to amb community distances >/= 20 minutes with pain </= 3/10 in bilateral knees.    Status Achieved      PT LONG TERM GOAL #3   Title Pt will improve her bilateral knee strength to grossly 5/5 in order to improve functional mobility.    Baseline 12/16: 4/5    Status Achieved    Target Date 07/05/21      PT LONG TERM GOAL #4   Title Pt will be able to navigate 1  flight stairs with step over step gait pattern with single hand rail with pain </= 3/10 in bilateral knees.    Baseline 12/16: still using step to pattern  Status Partially Met    Target Date 07/05/21      PT LONG TERM GOAL #5   Title Pt will improve her FOTO to >/= 73 %    Status Achieved                   Plan - 07/03/21 0828     Clinical Impression Statement Pt has met/partially met all goals and is ready for d/c.  She has extensive HEP and is working with a Physiological scientist at [employer].  Will d/c PT today.    Personal Factors and Comorbidities Comorbidity 3+    Comorbidities lymphedema, breat cancer, anemia, C-section, mastectomy, breast reconstruction, pre-diabetic    Examination-Activity Limitations Lift;Stand;Stairs;Squat;Transfers    Examination-Participation Restrictions Community Activity;Other;Occupation;Yard Work    Stability/Clinical Decision Making Stable/Uncomplicated    Rehab Potential Good    PT Frequency 1x / week    PT Duration 6 weeks    PT Treatment/Interventions ADLs/Self Care Home Management;Cryotherapy;Balance training;Therapeutic exercise;Therapeutic activities;Functional mobility training;Stair training;Gait training;Neuromuscular re-education;Ultrasound;Patient/family education;Passive range of motion;Manual techniques;Taping;Joint Manipulations;Vasopneumatic Device    PT Next Visit Plan d/c PT today    PT Home Exercise Plan Access Code: [place] and Agree with Plan of Care Patient             Patient will benefit from skilled therapeutic intervention in order to improve the following deficits and impairments:  Pain, Decreased range of motion, Difficulty walking, Obesity, Decreased activity tolerance, Impaired flexibility, Postural dysfunction, Decreased strength, Decreased mobility, Increased edema  Visit Diagnosis: Difficulty in walking, not elsewhere classified  Localized edema  Chronic pain of left knee  Chronic pain of  right knee  Muscle weakness (generalized)     Problem List Patient Active Problem List   Diagnosis Date Noted   Low serum thyroid stimulating hormone (TSH) 06/12/2021   Other fatigue 05/23/2021   Hemoglobin low 05/23/2021   Polyphagia 05/16/2020   Vitamin D deficiency 05/16/2020   At risk for osteoporosis 05/16/2020   Prediabetes 05/01/2020   Elevated blood pressure reading 05/01/2020   At risk for impaired metabolic function 49/44/9675   Breast asymmetry following reconstructive surgery 09/20/2019   Benign essential HTN 08/17/2018   Class 3 severe obesity with serious comorbidity and body mass index (BMI) of 40.0 to 44.9 in adult [facility]) 09/17/2017   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   Malignant neoplasm of upper-inner quadrant of left breast in female, estrogen receptor positive ([place]) 07/27/2017   Genetic testing 03/25/2017   Malignant 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, estrogen receptor positive ([place]) 01/23/2017   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 Patient goals were met. Patient is being discharged due to meeting the stated rehab goals.  Laureen Abrahams, PT, DPT 07/03/21 8:30 AM  [facility] Physical Therapy [address] [place], [place], [address] Phone: [phone]   Fax:  [phone]

## 2021-07-03 NOTE — Patient Instructions (Signed)
Access Code: [email or web address] URL: [email or web address] Date: 07/03/2021 Prepared by: Faustino Congress  Exercises Supine Bridge with [employer] Between Knees - 2 x daily - 7 x weekly - 2 sets - 10 reps - 5 seconds hold Seated Hamstring Stretch - 2 x daily - 7 x weekly - 6 reps - 30 seconds hold Heel Toe Raises with Unilateral Counter Support - 2 x daily - 7 x weekly - 2 sets - 10 reps Supine Active Straight Leg Raise - 2 x daily - 7 x weekly - 4 sets - 10 reps - 2-3 seconds hold Squat with Chair Touch - 2 x daily - 7 x weekly - 10 reps Clamshell with Resistance - 1 x daily - 7 x weekly - 4 sets - 10 reps Seated Straight Leg Raise - 1 x daily - 7 x weekly - 3 sets - 10 reps Kettlebell Deadlift - 1 x daily - 7 x weekly - 3 sets - 10 reps Forward and Backward Walking Lunge in [place] Hamstring Stretch with Noodle - 20 seconds hold Side Stepping Standing Hip Abduction Adduction at [employer] - 2 sets - 10 reps Standing Hip Flexion Extension at [employer] - 2 sets - 10 reps Squat - 2 sets - 10 reps Knee Extension with Weight Machine - 1 x daily - 3-4 x weekly - 3 sets - 10 reps Single Leg Knee Extension with Weight Machine - 1 x daily - 3-4 x weekly - 3 sets - 10 reps Hamstring Curl with Weight Machine - 1 x daily - 3-4 x weekly - 3 sets - 10 reps Single Leg Hamstring Curl with Weight Machine - 1 x daily - 3-4 x weekly - 3 sets - 10 reps Full Leg Press - 1 x daily - 3-4 x weekly - 3 sets - 10 reps Single Leg Press - 1 x daily - 3-4 x weekly - 3 sets - 10 reps Step Up - 1 x daily - 7 x weekly - 3 sets - 10 reps Lateral Step Down - 1 x daily - 7 x weekly - 3 sets - 10 reps Single Leg Squat with Counter Support - 1 x daily - 7 x weekly - 1-2 sets - 10 reps Single Leg Balance on Foam Pad - 1 x daily - 7 x weekly - 1-2 sets - 10 reps Single Leg Stance with 3-Way Kick on Foam - 1 x daily - 7 x weekly - 1-2 sets - 10 reps

## 2021-07-16 ENCOUNTER — Other Ambulatory Visit: Payer: Self-pay

## 2021-07-16 ENCOUNTER — Ambulatory Visit (INDEPENDENT_AMBULATORY_CARE_PROVIDER_SITE_OTHER): Payer: 59 | Admitting: Adult Health

## 2021-07-16 ENCOUNTER — Encounter (INDEPENDENT_AMBULATORY_CARE_PROVIDER_SITE_OTHER): Payer: Self-pay | Admitting: Adult Health

## 2021-07-16 ENCOUNTER — Other Ambulatory Visit (HOSPITAL_COMMUNITY): Payer: Self-pay

## 2021-07-16 VITALS — BP 134/77 | HR 101 | Temp 98.4°F | Ht 65.0 in | Wt 223.0 lb

## 2021-07-16 DIAGNOSIS — R7303 Prediabetes: Secondary | ICD-10-CM | POA: Diagnosis not present

## 2021-07-16 DIAGNOSIS — E669 Obesity, unspecified: Secondary | ICD-10-CM

## 2021-07-16 DIAGNOSIS — E559 Vitamin D deficiency, unspecified: Secondary | ICD-10-CM | POA: Diagnosis not present

## 2021-07-16 DIAGNOSIS — Z6837 Body mass index (BMI) 37.0-37.9, adult: Secondary | ICD-10-CM | POA: Diagnosis not present

## 2021-07-16 NOTE — Progress Notes (Signed)
Chief Complaint:   OBESITY Mackenzie [name] is here to discuss her progress with her obesity treatment plan along with follow-up of her obesity related diagnoses. Mackenzie [name] is on the Category 3 Plan and states she is following her eating plan approximately 75% of the time. Mackenzie [name] states she is walking/working with a Physiological scientist for 30 minutes 3 times per week.  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 HR slightly elevated today -101- she reports rushing to arrive on time for OV. She denies acute cardiac sx's.  Of note:  On 07/22/2021 - Appointment with [facility] - Dr. Kelton Pillar- re: Low TSH.  Subjective:   1. Pre-diabetes She has been on Ozempic 2 mg for >6 months. She denies mass in neck, dysphagia, dyspepsia, persistent hoarseness, abd pain, or GI upset. Insurance will not cover GLP-1 therapy after September 06, 2021.  2. Vitamin D deficiency She is off all vitamin D supplementation.  Assessment/Plan:   1. Pre-diabetes Continue Ozempic 2 mg once weekly.  No need for refill. Due to insurance will not cover GLP-1 therapy after September 06, 2021. Will discuss therapy alternatives in Spring 2023- Wegovy vs Metformin  2. Vitamin D deficiency Remain off vitamin D supplement. Check labs Q2-95M  3. Obesity: Current BMI 37.2  Mackenzie [name] is currently in the action stage of change. As such, her goal is to continue with weight loss efforts. She has agreed to the Category 3 Plan.   Exercise goals:  As is.  Behavioral modification strategies: increasing lean protein intake, decreasing simple carbohydrates, meal planning and cooking  strategies, keeping healthy foods in the home, and planning for success.  Mackenzie [name] has agreed to follow-up with our clinic in 2-3 weeks. She was informed of the importance of frequent follow-up visits to maximize her success with intensive lifestyle modifications for her multiple health conditions.   Objective:   Blood pressure 134/77, pulse (!) 101, temperature 98.4 F (36.9 C), height 5\' 5"  (1.651 m), weight 223 lb (101.2 kg), last menstrual period 02/10/2017, SpO2 97 %, unknown if currently breastfeeding. Body mass index is 37.11 kg/m.  General: Cooperative, alert, well developed, in no acute distress. HEENT: Conjunctivae and lids unremarkable. Cardiovascular: Regular rhythm.  Lungs: Normal work of breathing. Neurologic: No focal deficits.   Lab Results  Component Value Date   CREATININE 0.44 (L) 05/23/2021   BUN 13 05/23/2021   NA 141 05/23/2021   K 3.5 05/23/2021   CL 105 05/23/2021   CO2 22 05/23/2021   Lab Results  Component Value Date   ALT 52 (H) 05/23/2021   AST 40 05/23/2021   GGT 19 03/01/2021   ALKPHOS 97 05/23/2021   BILITOT 0.3 05/23/2021   Lab Results  Component Value Date   HGBA1C 5.4 05/23/2021   HGBA1C 5.4 02/27/2021   HGBA1C 5.4 11/15/2020   HGBA1C 5.1 06/27/2020   HGBA1C 5.6 01/09/2020   Lab Results  Component Value Date   INSULIN 15.1 05/23/2021   INSULIN 15.1 02/27/2021   INSULIN 11.7 11/15/2020   INSULIN 13.3 01/11/2020   INSULIN 12.6 08/09/2019   Lab Results  Component Value Date   TSH <0.005 (L) 05/23/2021  Lab Results  Component Value Date   CHOL 202 (H) 11/15/2020   HDL 58 11/15/2020   LDLCALC 135 (H) 11/15/2020   TRIG 52 11/15/2020   CHOLHDL 3.5 11/15/2020   Lab Results  Component Value Date   VD25OH 102.0 (H) 05/23/2021   VD25OH 83.0 02/27/2021   VD25OH 74.7 11/15/2020   Lab Results  Component Value Date   WBC 3.2 (L) 05/23/2021   HGB 10.9 (L) 05/23/2021   HCT 33.7 (L) 05/23/2021   MCV 86 05/23/2021   PLT 207  05/23/2021   Attestation Statements:   Reviewed by clinician on day of visit: allergies, medications, problem list, medical history, surgical history, family history, social history, and previous encounter notes.  Time spent on visit including pre-visit chart review and post-visit care and charting was 28 minutes.   I, Water quality scientist, CMA, am acting as Location manager for [name], NP.  I have reviewed the above documentation for accuracy and completeness, and I agree with the above. -  Chesley Veasey d. Keila Turan, NP-C

## 2021-07-22 ENCOUNTER — Ambulatory Visit (INDEPENDENT_AMBULATORY_CARE_PROVIDER_SITE_OTHER): Payer: 59 | Admitting: Internal Medicine

## 2021-07-22 ENCOUNTER — Encounter: Payer: Self-pay | Admitting: Internal Medicine

## 2021-07-22 ENCOUNTER — Other Ambulatory Visit: Payer: Self-pay

## 2021-07-22 VITALS — BP 122/76 | HR 80 | Ht 65.0 in | Wt 229.4 lb

## 2021-07-22 DIAGNOSIS — E0590 Thyrotoxicosis, unspecified without thyrotoxic crisis or storm: Secondary | ICD-10-CM | POA: Diagnosis not present

## 2021-07-22 NOTE — Progress Notes (Signed)
Name: Mackenzie [name]  MRN/ DOB: [id number], 1971-04-02    Age/ Sex: 51 y.o., female    PCP: [name], MD   Reason for Endocrinology Evaluation:  Hyperthyroidism     Date of Initial Endocrinology Evaluation: 07/22/2021     HPI: [name] is a 51 y.o. female with a past medical history of HTN, hx of  left breast Ca . The patient presented for initial endocrinology clinic visit on 07/22/2021 for consultative assistance with her  hyperthyroidism.   Pt has been diagnosed with hyperthyroidism in 05/2021 with a suppressed TSH <0.005 uIU/mL and elevated T4  and T3 at 5.96 and 417 ng/dL respectively.    She has no hx of osteoporosis  Of note, she is s/p left mastectomy due to breast ca, followed by chemo and radiation    She has been noted with weight loss since November but as if the last couple of weeks she has been noted with weight gain   She was on Biotin in the past but nothing recent  Denies diarrhea but has occasional constipation  Has occasional palpitations  Denies local neck swelling  Denies tremors but has tingling of finger tips     No fh of thyroid disease   HISTORY:  Past Medical History:  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   Allergic rhinitis 2012   Anemia 01/27/2017   prior to starting chemotherapy   Breast cancer ([place]) 01/14/2017   Left breast   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, left ([place]) 01/23/2017   Edema, lower extremity    Hot flashes 03/2017   Hyperlipidemia 03/20/2016   Morbid obesity with body mass index (BMI) of 40.0 to 49.9 ([place]) 09/17/2017   NCGS (non-celiac gluten sensitivity)    Pre-diabetes 07/26/2018   Hgb A1C elevated on 07/26/2018, Gestational Diabetes 2012   Seasonal allergies 2012   seasonal allergies causes allergic rhinitis and itchy, dry eyes per pt   Vitamin D deficiency 05/2015   Past Surgical History:  Past Surgical  History:  Procedure Laterality Date   BREAST RECONSTRUCTION WITH PLACEMENT OF TISSUE EXPANDER AND FLEX HD (ACELLULAR HYDRATED DERMIS) Left 08/27/2017   Procedure: LEFT BREAST RECONSTRUCTION WITH PLACEMENT OF TISSUE EXPANDER AND FLEX HD;  Surgeon: Wallace Going, DO;  Location: [facility];  Service: Plastics;  Laterality: Left;   CESAREAN SECTION     x2   LATISSIMUS FLAP TO BREAST Left 11/03/2018   LATISSIMUS FLAP TO BREAST Left 11/03/2018   Procedure: LATISSIMUS FLAP TO LEFT BREAST;  Surgeon: Wallace Going, DO;  Location: [facility];  Service: Plastics;  Laterality: Left;   MASTECTOMY MODIFIED RADICAL Left 08/27/2017   MASTECTOMY MODIFIED RADICAL Left 08/27/2017   Procedure: LEFT MODIFIED RADICAL MASTECTOMY;  Surgeon: Erroll Luna, MD;  Location: [facility];  Service: General;  Laterality: Left;   MASTOPEXY Right 03/03/2019   Procedure: RIGHT BREAST MASTOPEXY/REDUCTION;  Surgeon: Wallace Going, DO;  Location: [facility];  Service: Plastics;  Laterality: Right;  3 hours, please   PORT-A-CATH REMOVAL Right 08/27/2017   Procedure: REMOVAL PORT-A-CATH RIGHT CHEST;  Surgeon: Erroll Luna, MD;  Location: [facility];  Service: General;  Laterality: Right;   PORTACATH PLACEMENT Right 01/28/2017   Procedure: INSERTION PORT-A-CATH WITH ULTRASOUND;  Surgeon: Erroll Luna, MD;  Location: [facility];  Service: General;  Laterality: Right;   REDUCTION MAMMAPLASTY Right  REMOVAL OF TISSUE EXPANDER AND PLACEMENT OF IMPLANT Left 03/03/2019   Procedure: REMOVAL OF TISSUE EXPANDER AND PLACEMENT OF EXPANDER;  Surgeon: Wallace Going, DO;  Location: [facility];  Service: Plastics;  Laterality: Left;   REMOVAL OF TISSUE EXPANDER AND PLACEMENT OF IMPLANT Left 05/25/2019   Procedure: LEFT BREAST REMOVAL OF TISSUE EXPANDER AND PLACEMENT OF IMPLANT;  Surgeon: Wallace Going, DO;  Location: [facility];  Service: Plastics;  Laterality: Left;    TISSUE EXPANDER PLACEMENT Left 11/03/2018   Procedure: PLACEMENT OF TISSUE EXPANDER LEFT BREAST;  Surgeon: Wallace Going, DO;  Location: [facility];  Service: Plastics;  Laterality: Left;  Total case time is 3.5 hours   TUBAL LIGATION Bilateral 01/21/2011    Social History:  reports that she has never smoked. She has never used smokeless tobacco. She reports current alcohol use. She reports that she does not use drugs. Family History: family history includes Breast cancer (age of onset: 91) in her paternal grandmother; Diabetes in her mother and another family member; Hypertension in her mother and another family member; Kidney disease in her father; Other in her mother; Stroke in her mother and another family member.   HOME MEDICATIONS: Allergies as of 07/22/2021       Reactions   Dilaudid [hydromorphone] Itching        Medication List        Accurate as of July 22, 2021 12:54 PM. If you have any questions, ask your nurse or doctor.          acetaminophen 500 MG tablet Commonly known as: TYLENOL Take 1,000 mg by mouth every 6 (six) hours as needed (for pain/headaches.).   celecoxib 200 MG capsule Commonly known as: CELEBREX Take 1 capsule (200 mg total) by mouth 2 (two) times daily. What changed: when to take this   cetirizine 10 MG tablet Commonly known as: ZYRTEC Take 10 mg by mouth daily.   Fish Oil 1000 MG Caps Take 1,000 mg by mouth 2 (two) times daily.   gabapentin 300 MG capsule Commonly known as: NEURONTIN Take 1 capsule (300 mg total) by mouth at bedtime.   letrozole 2.5 MG tablet Commonly known as: FEMARA Take 1 tablet (2.5 mg total) by mouth daily.   loratadine 10 MG tablet Commonly known as: Claritin Take 1 tablet (10 mg total) by mouth daily. What changed:  when to take this reasons to take this   metFORMIN 500 MG tablet Commonly known as: GLUCOPHAGE Take 1 tablet (500 mg total) by mouth 2 (two) times daily with a meal.   multivitamin  with minerals Tabs tablet Take 1 tablet by mouth daily.   omeprazole 40 MG capsule Commonly known as: PRILOSEC Take 1 capsule (40 mg total) by mouth daily.   Ozempic (2 MG/DOSE) 8 MG/3ML Sopn Generic drug: Semaglutide (2 MG/DOSE) Inject 2 mg into the skin once a week.   PROBIOTIC DAILY PO Take 1 capsule by mouth 2 (two) times daily.   ZINC OXIDE PO Take 1 tablet by mouth 2 (two) times a day.          REVIEW OF SYSTEMS: A comprehensive ROS was conducted with the patient and is negative except as per HPI and below:  ROS     OBJECTIVE:  VS: LMP 02/10/2017    Wt Readings from Last 3 Encounters:  07/16/21 223 lb (101.2 kg)  06/27/21 222 lb (100.7 kg)  06/12/21 225 lb (102.1 kg)  EXAM: General: Pt appears well and is in NAD  Eyes: External eye exam normal without stare, lid lag or exophthalmos.  EOM intact.  PERRL.  Neck: General: Supple without adenopathy. Thyroid: Thyroid gland is prominent.  No  nodules appreciated. No thyroid bruit.  Lungs: Clear with good BS bilat with no rales, rhonchi, or wheezes  Heart: Auscultation: RRR.  Abdomen: Normoactive bowel sounds, soft, nontender, without masses or organomegaly palpable  Extremities:  BL LE: No pretibial edema normal ROM and strength.  Mental Status: Judgment, insight: Intact Orientation: Oriented to time, place, and person Mood and affect: No depression, anxiety, or agitation     DATA REVIEWED:     Latest Reference Range & Units 05/23/21 08:43  TSH 0.450 - 4.500 uIU/mL <0.005 (L)  Triiodothyronine (T3) 71 - 180 ng/dL 417 (H)  T4,Free(Direct) 0.82 - 1.77 ng/dL 5.96 (H)    ASSESSMENT/PLAN/RECOMMENDATIONS:   Hyperthyroidism:   -Patient is clinically euthyroid -No local neck symptoms -Differential diagnosis includes Graves' disease, toxic thyroid nodule, assay interference, subacute thyroiditis  -We discussed with pt the benefits of methimazole in the Tx of hyperthyroidism, as well as the possible  side effects/complications of anti-thyroid drug Tx (specifically detailing the rare, but serious side effect of agranulocytosis). She was informed of need for regular thyroid function monitoring while on methimazole to ensure appropriate dosage without over-treatment. As well, we discussed the possible side effects of methimazole including the chance of rash, the small chance of liver irritation/juandice and the <=1 in 300-400 chance of sudden onset agranulocytosis.  We discussed importance of going to ED promptly (and stopping methimazole) if shewere to develop significant fever with severe sore throat of other evidence of acute infection.      We extensively discussed the various treatment options for hyperthyroidism and Graves disease including ablation therapy with radioactive iodine versus antithyroid drug treatment versus surgical therapy.  I carefully explained to the patient that one of the consequences of I-131 ablation treatment would likely be permanent hypothyroidism which would require long-term replacement therapy with LT4.  -We will proceed with TFTs as well as TRAb, we do not have a phlebotomist today hence the patient will return for blood work  -Further planning will be determined based on her results  Follow-up in 3 months Signed electronically by: Mack Guise, MD  [facility]  [address] [place], [place] [address] Phone: [phone] FAX: [phone]   CC: [name], MD [place] [address] Phone: [phone] Fax: [phone]   Return to Endocrinology clinic as below: Future Appointments  Date Time Provider [place]  07/22/2021  1:00 PM Beckem Tomberlin, Melanie Crazier, MD [facility] None  07/31/2021  8:30 AM [name] D, NP [facility] None  08/06/2021  8:15 AM Dillingham, Loel Lofty, DO [facility] None  08/19/2021  8:30 AM [name] D, NP [facility] None  08/20/2021   9:00 AM [facility] None  08/20/2021  9:30 AM Benay Pike, MD [facility] None

## 2021-07-23 ENCOUNTER — Other Ambulatory Visit: Payer: Self-pay

## 2021-07-23 ENCOUNTER — Other Ambulatory Visit (INDEPENDENT_AMBULATORY_CARE_PROVIDER_SITE_OTHER): Payer: 59

## 2021-07-23 DIAGNOSIS — E0590 Thyrotoxicosis, unspecified without thyrotoxic crisis or storm: Secondary | ICD-10-CM

## 2021-07-23 LAB — CBC WITH DIFFERENTIAL/PLATELET
Basophils Absolute: 0 10*3/uL (ref 0.0–0.1)
Basophils Relative: 1 % (ref 0.0–3.0)
Eosinophils Absolute: 0 10*3/uL (ref 0.0–0.7)
Eosinophils Relative: 1 % (ref 0.0–5.0)
HCT: 35.9 % — ABNORMAL LOW (ref 36.0–46.0)
Hemoglobin: 11.5 g/dL — ABNORMAL LOW (ref 12.0–15.0)
Lymphocytes Relative: 29.8 % (ref 12.0–46.0)
Lymphs Abs: 1.1 10*3/uL (ref 0.7–4.0)
MCHC: 31.9 g/dL (ref 30.0–36.0)
MCV: 79.6 fl (ref 78.0–100.0)
Monocytes Absolute: 0.7 10*3/uL (ref 0.1–1.0)
Monocytes Relative: 17.9 % — ABNORMAL HIGH (ref 3.0–12.0)
Neutro Abs: 1.9 10*3/uL (ref 1.4–7.7)
Neutrophils Relative %: 50.3 % (ref 43.0–77.0)
Platelets: 201 10*3/uL (ref 150.0–400.0)
RBC: 4.51 Mil/uL (ref 3.87–5.11)
RDW: 13.5 % (ref 11.5–15.5)
WBC: 3.8 10*3/uL — ABNORMAL LOW (ref 4.0–10.5)

## 2021-07-23 LAB — T4, FREE: Free T4: 5.39 ng/dL — ABNORMAL HIGH (ref 0.60–1.60)

## 2021-07-23 LAB — TSH: TSH: <0.01 u[IU]/mL — ABNORMAL LOW (ref 0.35–5.50)

## 2021-07-24 ENCOUNTER — Telehealth: Payer: Self-pay | Admitting: Internal Medicine

## 2021-07-24 ENCOUNTER — Encounter: Payer: Self-pay | Admitting: Internal Medicine

## 2021-07-24 ENCOUNTER — Other Ambulatory Visit (HOSPITAL_COMMUNITY): Payer: Self-pay

## 2021-07-24 DIAGNOSIS — E0590 Thyrotoxicosis, unspecified without thyrotoxic crisis or storm: Secondary | ICD-10-CM

## 2021-07-24 LAB — COMPREHENSIVE METABOLIC PANEL
ALT: 42 U/L — ABNORMAL HIGH (ref 0–35)
AST: 30 U/L (ref 0–37)
Albumin: 4 g/dL (ref 3.5–5.2)
Alkaline Phosphatase: 102 U/L (ref 39–117)
BUN: 20 mg/dL (ref 6–23)
CO2: 27 mEq/L (ref 19–32)
Calcium: 10 mg/dL (ref 8.4–10.5)
Chloride: 103 mEq/L (ref 96–112)
Creatinine, Ser: 0.5 mg/dL (ref 0.40–1.20)
GFR: 107.74 mL/min (ref >60.00)
Glucose, Bld: 90 mg/dL (ref 70–99)
Potassium: 4 mEq/L (ref 3.5–5.1)
Sodium: 138 mEq/L (ref 135–145)
Total Bilirubin: 0.5 mg/dL (ref 0.2–1.2)
Total Protein: 6.5 g/dL (ref 6.0–8.3)

## 2021-07-24 MED ORDER — METHIMAZOLE 10 MG PO TABS
10.0000 mg | ORAL_TABLET | Freq: Two times a day (BID) | ORAL | 1 refills | Status: DC
Start: 2021-07-24 — End: 2021-10-22
  Filled 2021-07-24: qty 180, 90d supply, fill #0

## 2021-07-24 NOTE — Telephone Encounter (Signed)
Patient schedule for 07/26/21.

## 2021-07-24 NOTE — Telephone Encounter (Signed)
Attempted to contact the patient on 07/24/2021 at 1230, no answer.  A voicemail was left to check the portal   Patient will be started on methimazole 10 mg twice daily A portal message was also sent to have TFTs rechecked in 6 weeks

## 2021-07-25 ENCOUNTER — Encounter: Payer: Self-pay | Admitting: Internal Medicine

## 2021-07-25 LAB — TRAB (TSH RECEPTOR BINDING ANTIBODY): TRAB: 9.2 IU/L — ABNORMAL HIGH (ref <=2.00)

## 2021-07-26 ENCOUNTER — Ambulatory Visit: Payer: 59

## 2021-07-26 ENCOUNTER — Other Ambulatory Visit: Payer: Self-pay

## 2021-07-26 DIAGNOSIS — R072 Precordial pain: Secondary | ICD-10-CM

## 2021-07-26 DIAGNOSIS — R0609 Other forms of dyspnea: Secondary | ICD-10-CM

## 2021-07-30 ENCOUNTER — Other Ambulatory Visit (HOSPITAL_COMMUNITY): Payer: Self-pay

## 2021-07-30 DIAGNOSIS — R030 Elevated blood-pressure reading, without diagnosis of hypertension: Secondary | ICD-10-CM | POA: Diagnosis not present

## 2021-07-30 DIAGNOSIS — E0590 Thyrotoxicosis, unspecified without thyrotoxic crisis or storm: Secondary | ICD-10-CM | POA: Diagnosis not present

## 2021-07-30 DIAGNOSIS — R000 Tachycardia, unspecified: Secondary | ICD-10-CM | POA: Diagnosis not present

## 2021-07-30 DIAGNOSIS — R0789 Other chest pain: Secondary | ICD-10-CM | POA: Diagnosis not present

## 2021-07-30 MED ORDER — METOPROLOL SUCCINATE ER 50 MG PO TB24
50.0000 mg | ORAL_TABLET | Freq: Every day | ORAL | 0 refills | Status: DC
Start: 2021-07-30 — End: 2021-10-22
  Filled 2021-07-30: qty 90, 90d supply, fill #0

## 2021-07-30 MED ORDER — GABAPENTIN 300 MG PO CAPS
300.0000 mg | ORAL_CAPSULE | Freq: Two times a day (BID) | ORAL | 0 refills | Status: DC
  Filled 2021-07-30: qty 180, 90d supply, fill #0

## 2021-07-30 NOTE — Progress Notes (Signed)
Called and spoke to pt. Pt voiced understanding. She did follow up with her PCP and she does not currently have any chest pain.

## 2021-07-31 ENCOUNTER — Other Ambulatory Visit: Payer: Self-pay | Admitting: *Deleted

## 2021-07-31 ENCOUNTER — Encounter (INDEPENDENT_AMBULATORY_CARE_PROVIDER_SITE_OTHER): Payer: Self-pay | Admitting: Adult Health

## 2021-07-31 ENCOUNTER — Ambulatory Visit (INDEPENDENT_AMBULATORY_CARE_PROVIDER_SITE_OTHER): Payer: 59 | Admitting: Adult Health

## 2021-07-31 ENCOUNTER — Other Ambulatory Visit: Payer: Self-pay

## 2021-07-31 VITALS — BP 147/83 | HR 90 | Temp 98.1°F | Ht 65.0 in | Wt 222.0 lb

## 2021-07-31 DIAGNOSIS — I10 Essential (primary) hypertension: Secondary | ICD-10-CM

## 2021-07-31 DIAGNOSIS — Z6837 Body mass index (BMI) 37.0-37.9, adult: Secondary | ICD-10-CM | POA: Diagnosis not present

## 2021-07-31 DIAGNOSIS — E0590 Thyrotoxicosis, unspecified without thyrotoxic crisis or storm: Secondary | ICD-10-CM | POA: Diagnosis not present

## 2021-07-31 DIAGNOSIS — R7303 Prediabetes: Secondary | ICD-10-CM

## 2021-07-31 DIAGNOSIS — E669 Obesity, unspecified: Secondary | ICD-10-CM

## 2021-07-31 DIAGNOSIS — Z6841 Body Mass Index (BMI) 40.0 and over, adult: Secondary | ICD-10-CM

## 2021-07-31 NOTE — Progress Notes (Signed)
Chief Complaint:   OBESITY Mackenzie [name] is here to discuss her progress with her obesity treatment plan along with follow-up of her obesity related diagnoses. Mackenzie [name] is on the Category 3 Plan and states she is following her eating plan approximately 70% of the time. Mackenzie [name] states she is not exercising regularly at this time.  Today's visit was #: 37 Starting weight: 267 lbs Starting date: 08/09/2019 Today's weight: 222 lbs Today's date: 07/31/2021 Total lbs lost to date: 45 lbs Total lbs lost since last in-office visit: 1 lb  Interim History:  On 07/30/2021 - Mackenzie [name] had a visit with Dr. [facility]. BP/HR elevated - started on metoprolol succinate 50 mg daily .  Current  BP/HR 147/83, 90-did not take daily BB dose prior to OV.  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 Recently developed right lower rib pain, pain radiates anterior - posterior. Currently pain rated 4/10 - described as a dull ache.  Subjective:   1. Essential hypertension On 07/30/2021 - Mackenzie [name] had a visit with Dr. [facility]. BP/HR elevated - started on metoprolol succinate 50 mg daily . Of note- recent tx for hyperthyroidism.  2. Hyperthyroidism She had an office visit with Endo on 07/22/2021: Pt has been diagnosed with hyperthyroidism in 05/2021 with a suppressed TSH <0.005 uIU/mL  elevated T4  and T3 at 5.96 and 417 ng/dL respectively.   -We will proceed with TFTs as well as TRAb.  -Further planning will be determined based on her results 07/24/21- Started on methimazole 10 mg twice daily TFTs rechecked in 6 weeks  3. Pre-diabetes She has been on Ozempic 2 mg for >6 months. She denies mass in neck, dysphagia, dyspepsia, persistent hoarseness, abd pain, or GI upset. Insurance will not cover GLP-1 therapy after September 06, 2021.  Assessment/Plan:   1. Essential hypertension Monitor BP/HR - bring log to next office visit.  2.  Hyperthyroidism Continue methimazole (Tapazole) BID, follow-up with Endo as directed.  3. Pre-diabetes Continue Ozempic 2 mg, consider changing to [facility] after coverage stops for Ozempic.  4. Obesity: Current BMI 37.0  Mackenzie [name] is currently in the action stage of change. As such, her goal is to continue with weight loss efforts. She has agreed to the Category 3 Plan.   Exercise goals:  As is.  Behavioral modification strategies: increasing lean protein intake, decreasing simple carbohydrates, meal planning and cooking strategies, keeping healthy foods in the home, and planning for success.  Mackenzie [name] has agreed to follow-up with our clinic in 2-3 weeks. She was informed of the importance of frequent follow-up visits to maximize her success with intensive lifestyle modifications for her multiple health conditions.   Objective:   Blood pressure (!) 147/83, pulse 90, temperature 98.1 F (36.7 C), height 5\' 5"  (1.651 m), weight 222 lb (100.7 kg), last menstrual period 02/10/2017, SpO2 99 %, unknown if currently breastfeeding. Body mass index is 36.94 kg/m.  General: Cooperative, alert, well developed, in no acute distress. HEENT: Conjunctivae and lids unremarkable. Cardiovascular: Regular rhythm.  Lungs: Normal work of breathing. Neurologic: No focal deficits.   Lab Results  Component Value Date   CREATININE 0.50 07/23/2021   BUN 20 07/23/2021   NA 138 07/23/2021   K 4.0 07/23/2021   CL 103 07/23/2021   CO2 27 07/23/2021   Lab Results  Component Value Date   ALT 42 (H) 07/23/2021   AST 30 07/23/2021   GGT 19 03/01/2021   ALKPHOS 102  07/23/2021   BILITOT 0.5 07/23/2021   Lab Results  Component Value Date   HGBA1C 5.4 05/23/2021   HGBA1C 5.4 02/27/2021   HGBA1C 5.4 11/15/2020   HGBA1C 5.1 06/27/2020   HGBA1C 5.6 01/09/2020   Lab Results  Component Value Date   INSULIN 15.1 05/23/2021   INSULIN 15.1 02/27/2021   INSULIN 11.7 11/15/2020   INSULIN 13.3 01/11/2020    INSULIN 12.6 08/09/2019   Lab Results  Component Value Date   TSH <0.01 Repeated and verified X2. (L) 07/23/2021   Lab Results  Component Value Date   CHOL 202 (H) 11/15/2020   HDL 58 11/15/2020   LDLCALC 135 (H) 11/15/2020   TRIG 52 11/15/2020   CHOLHDL 3.5 11/15/2020   Lab Results  Component Value Date   VD25OH 102.0 (H) 05/23/2021   VD25OH 83.0 02/27/2021   VD25OH 74.7 11/15/2020   Lab Results  Component Value Date   WBC 3.8 (L) 07/23/2021   HGB 11.5 (L) 07/23/2021   HCT 35.9 (L) 07/23/2021   MCV 79.6 07/23/2021   PLT 201.0 07/23/2021   Attestation Statements:   Reviewed by clinician on day of visit: allergies, medications, problem list, medical history, surgical history, family history, social history, and previous encounter notes.  Time spent on visit including pre-visit chart review and post-visit care and charting was 29 minutes.   I, Water quality scientist, CMA, am acting as Location manager for [name], NP.  I have reviewed the above documentation for accuracy and completeness, and I agree with the above. -  Lanaysia Fritchman d. Denzell Colasanti, NP-C

## 2021-08-01 ENCOUNTER — Other Ambulatory Visit (HOSPITAL_COMMUNITY): Payer: Self-pay

## 2021-08-01 MED ORDER — CYCLOBENZAPRINE HCL 10 MG PO TABS
5.0000 mg | ORAL_TABLET | Freq: Two times a day (BID) | ORAL | 0 refills | Status: DC | PRN
  Filled 2021-08-01: qty 30, 30d supply, fill #0

## 2021-08-06 ENCOUNTER — Encounter: Payer: Self-pay | Admitting: Plastic Surgery

## 2021-08-06 ENCOUNTER — Ambulatory Visit (INDEPENDENT_AMBULATORY_CARE_PROVIDER_SITE_OTHER): Payer: 59 | Admitting: Plastic Surgery

## 2021-08-06 ENCOUNTER — Other Ambulatory Visit: Payer: Self-pay

## 2021-08-06 DIAGNOSIS — N651 Disproportion of reconstructed breast: Secondary | ICD-10-CM

## 2021-08-06 DIAGNOSIS — C50812 Malignant neoplasm of overlapping sites of left female breast: Secondary | ICD-10-CM | POA: Diagnosis not present

## 2021-08-06 DIAGNOSIS — Z170 Estrogen receptor positive status [ER+]: Secondary | ICD-10-CM | POA: Diagnosis not present

## 2021-08-06 DIAGNOSIS — C50212 Malignant neoplasm of upper-inner quadrant of left female breast: Secondary | ICD-10-CM

## 2021-08-06 DIAGNOSIS — Z719 Counseling, unspecified: Secondary | ICD-10-CM

## 2021-08-06 NOTE — Progress Notes (Signed)
Patient ID: Mackenzie [name], female    DOB: July 11, 1970, 51 y.o.   MRN: [id number]   Chief Complaint  Patient presents with   Follow-up   Breast Problem    The patient is a 51 year old female here for follow-up on her breast reconstruction.  She was treated for left breast cancer with a mastectomy and radiation.  She had a latissimus muscle flap with implant placement in 2020.  She has a ultrahigh profile 750 cc Mentor implant in the left breast with a latissimus muscle flap.  She has a little bit of breast asymmetry even though she had a mastopexy reduction of the right breast.  She has not had the nipple areola reconstruction as of yet.  She is working with healthy weight and wellness to decrease her weight.  She also found out she has some thyroid disease and that is being stabilized.  She has not met her weight goal yet.     Review of Systems  Constitutional: Negative.   HENT: Negative.    Eyes: Negative.   Respiratory: Negative.    Cardiovascular: Negative.   Gastrointestinal: Negative.   Endocrine: Negative.   Genitourinary: Negative.   Musculoskeletal: Negative.   Skin: Negative.    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, left ([place]) 01/23/2017   Edema, lower extremity    Hot flashes 03/2017   Hyperlipidemia 03/20/2016   Morbid obesity with body mass index (BMI) of 40.0 to 49.9 ([facility]) 09/17/2017   NCGS (non-celiac gluten sensitivity)    Pre-diabetes 07/26/2018   Hgb A1C elevated on 07/26/2018, Gestational Diabetes 2012   Seasonal allergies 2012   seasonal allergies causes allergic rhinitis and itchy, dry eyes per pt   Vitamin D deficiency 05/2015    Past Surgical History:  Procedure Laterality Date   BREAST  RECONSTRUCTION WITH PLACEMENT OF TISSUE EXPANDER AND FLEX HD (ACELLULAR HYDRATED DERMIS) Left 08/27/2017   Procedure: LEFT BREAST RECONSTRUCTION WITH PLACEMENT OF TISSUE EXPANDER AND FLEX HD;  Surgeon: Wallace Going, DO;  Location: [facility];  Service: Plastics;  Laterality: Left;   CESAREAN SECTION     x2   LATISSIMUS FLAP TO BREAST Left 11/03/2018   LATISSIMUS FLAP TO BREAST Left 11/03/2018   Procedure: LATISSIMUS FLAP TO LEFT BREAST;  Surgeon: Wallace Going, DO;  Location: [facility];  Service: Plastics;  Laterality: Left;   MASTECTOMY MODIFIED RADICAL Left 08/27/2017   MASTECTOMY MODIFIED RADICAL Left 08/27/2017   Procedure: LEFT MODIFIED RADICAL MASTECTOMY;  Surgeon: Erroll Luna, MD;  Location: [facility];  Service: General;  Laterality: Left;   MASTOPEXY Right 03/03/2019   Procedure: RIGHT BREAST MASTOPEXY/REDUCTION;  Surgeon: Wallace Going, DO;  Location: [facility];  Service: Plastics;  Laterality: Right;  3 hours, please   PORT-A-CATH REMOVAL Right 08/27/2017   Procedure: REMOVAL PORT-A-CATH RIGHT CHEST;  Surgeon: Erroll Luna, MD;  Location: [facility];  Service: General;  Laterality: Right;   PORTACATH PLACEMENT Right 01/28/2017   Procedure: INSERTION PORT-A-CATH WITH ULTRASOUND;  Surgeon: Erroll Luna, MD;  Location: [facility];  Service: General;  Laterality: Right;   REDUCTION MAMMAPLASTY Right    REMOVAL OF TISSUE EXPANDER AND PLACEMENT OF IMPLANT Left 03/03/2019  Procedure: REMOVAL OF TISSUE EXPANDER AND PLACEMENT OF EXPANDER;  Surgeon: Wallace Going, DO;  Location: [facility];  Service: Plastics;  Laterality: Left;   REMOVAL OF TISSUE EXPANDER AND PLACEMENT OF IMPLANT Left 05/25/2019   Procedure: LEFT BREAST REMOVAL OF TISSUE EXPANDER AND PLACEMENT OF IMPLANT;  Surgeon: Wallace Going, DO;  Location: [facility];  Service: Plastics;  Laterality: Left;   TISSUE EXPANDER PLACEMENT Left 11/03/2018    Procedure: PLACEMENT OF TISSUE EXPANDER LEFT BREAST;  Surgeon: Wallace Going, DO;  Location: [facility];  Service: Plastics;  Laterality: Left;  Total case time is 3.5 hours   TUBAL LIGATION Bilateral 01/21/2011      Current Outpatient Medications:    acetaminophen (TYLENOL) 500 MG tablet, Take 1,000 mg by mouth every 6 (six) hours as needed (for pain/headaches.). , Disp: , Rfl:    celecoxib (CELEBREX) 200 MG capsule, Take 1 capsule (200 mg total) by mouth 2 (two) times daily. (Patient taking differently: Take 200 mg by mouth daily.), Disp: 30 capsule, Rfl: 3   cetirizine (ZYRTEC) 10 MG tablet, Take 10 mg by mouth daily., Disp: , Rfl:    cyclobenzaprine (FLEXERIL) 10 MG tablet, Take 0.5 tablets (5 mg total) by mouth 2 (two) times daily as needed., Disp: 30 tablet, Rfl: 0   gabapentin (NEURONTIN) 300 MG capsule, Take 1 capsule (300 mg total) by mouth at bedtime., Disp: 90 capsule, Rfl: 4   gabapentin (NEURONTIN) 300 MG capsule, Take 1 capsule (300 mg total) by mouth 2 (two) times daily., Disp: 180 capsule, Rfl: 0   letrozole (FEMARA) 2.5 MG tablet, Take 1 tablet (2.5 mg total) by mouth daily., Disp: 90 tablet, Rfl: 4   loratadine (CLARITIN) 10 MG tablet, Take 1 tablet (10 mg total) by mouth daily. (Patient taking differently: Take 10 mg by mouth daily as needed for allergies.), Disp: 90 tablet, Rfl: 2   metFORMIN (GLUCOPHAGE) 500 MG tablet, Take 1 tablet (500 mg total) by mouth 2 (two) times daily with a meal., Disp: 180 tablet, Rfl: 0   methimazole (TAPAZOLE) 10 MG tablet, Take 1 tablet (10 mg total) by mouth 2 (two) times daily., Disp: 180 tablet, Rfl: 1   metoprolol succinate (TOPROL-XL) 50 MG 24 hr tablet, Take 1 tablet (50 mg total) by mouth daily., Disp: 90 tablet, Rfl: 0   Multiple Vitamin (MULTIVITAMIN WITH MINERALS) TABS tablet, Take 1 tablet by mouth daily. , Disp: , Rfl:    Omega-3 Fatty Acids (FISH OIL) 1000 MG CAPS, Take 1,000 mg by mouth 2 (two) times daily. , Disp: , Rfl:     omeprazole (PRILOSEC) 40 MG capsule, Take 1 capsule (40 mg total) by mouth daily., Disp: 90 capsule, Rfl: 1   Probiotic Product (PROBIOTIC DAILY PO), Take 1 capsule by mouth 2 (two) times daily. , Disp: , Rfl:    Semaglutide, 2 MG/DOSE, (OZEMPIC, 2 MG/DOSE,) 8 MG/3ML SOPN, Inject 2 mg into the skin once a week., Disp: 9 mL, Rfl: 0   ZINC OXIDE PO, Take 1 tablet by mouth 2 (two) times a day., Disp: , Rfl:  No current facility-administered medications for this visit.  Facility-Administered Medications Ordered in Other Visits:    sodium chloride flush (NS) 0.9 % injection 10 mL, 10 mL, Intravenous, PRN, Magrinat, [name], MD, 10 mL at 03/10/17 1239   Objective:   There were no vitals filed for this visit.  Physical Exam Constitutional:      Appearance: Normal appearance.  Cardiovascular:  Rate and Rhythm: Normal rate.     Pulses: Normal pulses.  Pulmonary:     Effort: Pulmonary effort is normal. No respiratory distress.  Abdominal:     Palpations: Abdomen is soft.  Skin:    Capillary Refill: Capillary refill takes less than 2 seconds.     Coloration: Skin is not jaundiced.     Findings: No bruising.  Neurological:     Mental Status: She is alert and oriented to person, place, and time.  Psychiatric:        Mood and Affect: Mood normal.        Behavior: Behavior normal.        Thought Content: Thought content normal.    Assessment & Plan:  Malignant neoplasm of upper-inner quadrant of left breast in female, estrogen receptor positive ([place])  Malignant 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, estrogen receptor positive ([place])  Breast asymmetry following reconstructive surgery  We will go ahead with nipple areolar tattooing of the left breast.  Once the patient stabilizes on her weight and thyroid we will move towards fat grafting of the left breast.  We will plan on doing a follow-up visit and 6 months.  The advanced practice practitioner (APP) assisted  throughout the case.  The APP was essential in retraction and counter traction when needed to make the case progress smoothly.  This retraction and assistance made it possible to see the tissue plans for the procedure.  The assistance was needed for blood control, tissue re-approximation and assisted with closure of the incision site.   [place], DO

## 2021-08-19 ENCOUNTER — Other Ambulatory Visit: Payer: Self-pay

## 2021-08-19 ENCOUNTER — Ambulatory Visit (INDEPENDENT_AMBULATORY_CARE_PROVIDER_SITE_OTHER): Payer: 59 | Admitting: Adult Health

## 2021-08-19 ENCOUNTER — Encounter (INDEPENDENT_AMBULATORY_CARE_PROVIDER_SITE_OTHER): Payer: Self-pay | Admitting: Adult Health

## 2021-08-19 ENCOUNTER — Telehealth: Payer: Self-pay | Admitting: *Deleted

## 2021-08-19 VITALS — BP 128/74 | HR 87 | Temp 97.9°F | Ht 65.0 in | Wt 220.0 lb

## 2021-08-19 DIAGNOSIS — Z6836 Body mass index (BMI) 36.0-36.9, adult: Secondary | ICD-10-CM | POA: Diagnosis not present

## 2021-08-19 DIAGNOSIS — E669 Obesity, unspecified: Secondary | ICD-10-CM | POA: Diagnosis not present

## 2021-08-19 DIAGNOSIS — R7303 Prediabetes: Secondary | ICD-10-CM

## 2021-08-19 DIAGNOSIS — E0590 Thyrotoxicosis, unspecified without thyrotoxic crisis or storm: Secondary | ICD-10-CM

## 2021-08-19 NOTE — Progress Notes (Signed)
? ? ? ?Chief Complaint:  ? ?OBESITY ?Mackenzie [name] is here to discuss her progress with her obesity treatment plan along with follow-up of her obesity related diagnoses. Mackenzie [name] is on the Category 3 Plan and states she is following her eating plan approximately 80% of the time. [name] states she is walking for 30 minutes 4 times per week. ? ?Today's visit was #: 47 ?Starting weight: 267 lbs ?Starting date: 08/09/2019 ?Today's weight: 220 lbs ?Today's date: 08/19/2021 ?Total lbs lost to date: 47 lbs ?Total lbs lost since last in-office visit: 2 lbs ? ?Interim History:  ?[name] is an Therapist, sports at [place] - Stepdown/Telemetry, 34 beds  ?To be fully staffed, require 9 RNs per shift. ?Most days only 7-8 RN per shift. ?3 new Travelling RNs will start this week. ? ?No acute cardiac symptoms. ? ?Subjective:  ? ?1. Hyperthyroidism ?PCP Initially started on gabapentin for bilateral lower extremity neuropathy.   ?Started Tapazole 10 mg BID on 07/24/2021. ?Left hand numbness developed on 07/27/2021.   ?PCP increased gabapentin 300 mg to BID. ? ?2. Pre-diabetes ?She has been on GLP-1 therapy since 04/2020- initially started by her PCP, then [place] assumed RFs. ?On Ozempic 2 mg once weekly.  Her insurance will not cover Ozempic after this month. ?She denies mass in neck, dysphagia, dyspepsia, persistent hoarseness, abd pain, or N/V/constipation. ? ?Assessment/Plan:  ? ?1. Hyperthyroidism ?Continue Tapazole 10 mg BID. ?Follow-up with Endo as directed. ? ?2. Pre-diabetes ?Convert Ozempic to [place] at next office visit. ? ?3. Obesity: Current BMI 36.6 ? ?Mackenzie [name] is currently in the action stage of change. As such, her goal is to continue with weight loss efforts. She has agreed to the Category 3 Plan.  ? ?Exercise goals:  As is. ? ?Behavioral modification strategies: meal planning and cooking strategies, keeping healthy foods in the home, and planning for success. ? ?[name] has agreed to follow-up with our clinic in 2-3 weeks.  She was informed of the importance of frequent follow-up visits to maximize her success with intensive lifestyle modifications for her multiple health conditions.  ? ?Objective:  ? ?Blood pressure 128/74, pulse 87, temperature 97.9 ?F (36.6 ?C), height '5\' 5"'$  (1.651 m), weight 220 lb (99.8 kg), last menstrual period 02/10/2017, SpO2 100 %, unknown if currently breastfeeding. ?Body mass index is 36.61 kg/m?. ? ?General: Cooperative, alert, well developed, in no acute distress. ?HEENT: Conjunctivae and lids unremarkable. ?Cardiovascular: Regular rhythm.  ?Lungs: Normal work of breathing. ?Neurologic: No focal deficits.  ? ?Lab Results  ?Component Value Date  ? CREATININE 0.50 07/23/2021  ? BUN 20 07/23/2021  ? NA 138 07/23/2021  ? K 4.0 07/23/2021  ? CL 103 07/23/2021  ? CO2 27 07/23/2021  ? ?Lab Results  ?Component Value Date  ? ALT 42 (H) 07/23/2021  ? AST 30 07/23/2021  ? GGT 19 03/01/2021  ? ALKPHOS 102 07/23/2021  ? BILITOT 0.5 07/23/2021  ? ?Lab Results  ?Component Value Date  ? HGBA1C 5.4 05/23/2021  ? HGBA1C 5.4 02/27/2021  ? HGBA1C 5.4 11/15/2020  ? HGBA1C 5.1 06/27/2020  ? HGBA1C 5.6 01/09/2020  ? ?Lab Results  ?Component Value Date  ? INSULIN 15.1 05/23/2021  ? INSULIN 15.1 02/27/2021  ? INSULIN 11.7 11/15/2020  ? INSULIN 13.3 01/11/2020  ? INSULIN 12.6 08/09/2019  ? ?Lab Results  ?Component Value Date  ? TSH <0.01 Repeated and verified X2. (L) 07/23/2021  ? ?Lab Results  ?Component Value Date  ? CHOL 202 (H) 11/15/2020  ?  HDL 58 11/15/2020  ? LDLCALC 135 (H) 11/15/2020  ? TRIG 52 11/15/2020  ? CHOLHDL 3.5 11/15/2020  ? ?Lab Results  ?Component Value Date  ? VD25OH 102.0 (H) 05/23/2021  ? VD25OH 83.0 02/27/2021  ? VD25OH 74.7 11/15/2020  ? ?Lab Results  ?Component Value Date  ? WBC 3.8 (L) 07/23/2021  ? HGB 11.5 (L) 07/23/2021  ? HCT 35.9 (L) 07/23/2021  ? MCV 79.6 07/23/2021  ? PLT 201.0 07/23/2021  ? ?Attestation Statements:  ? ?Reviewed by clinician on day of visit: allergies, medications, problem list,  medical history, surgical history, family history, social history, and previous encounter notes. ? ?Time spent on visit including pre-visit chart review and post-visit care and charting was 28 minutes.  ? ?I, Water quality scientist, CMA, am acting as Location manager for [name], NP. ? ?I have reviewed the above documentation for accuracy and completeness, and I agree with the above. -  Jaiveon Suppes d. Quintin Hjort, NP-C ?

## 2021-08-19 NOTE — Telephone Encounter (Signed)
[name]: Reminded patient about lab and study visit with Dr. Chryl Heck tomorrow morning. Informed no need to fast for labs, they are to check hormone levels only.  Reminded to bring completed medication diaries. Patient verbalized understanding.  ?Foye Spurling, BSN, RN, CCRP ?Clinical Research Nurse II ?08/19/2021 12:05 PM ? ?

## 2021-08-20 ENCOUNTER — Encounter: Payer: Self-pay | Admitting: *Deleted

## 2021-08-20 ENCOUNTER — Inpatient Hospital Stay: Payer: 59 | Attending: Adult Health

## 2021-08-20 ENCOUNTER — Encounter: Payer: Self-pay | Admitting: Hematology and Oncology

## 2021-08-20 ENCOUNTER — Inpatient Hospital Stay (HOSPITAL_BASED_OUTPATIENT_CLINIC_OR_DEPARTMENT_OTHER): Payer: 59 | Admitting: Hematology and Oncology

## 2021-08-20 VITALS — BP 134/74 | HR 86 | Temp 97.7°F | Resp 16 | Ht 65.0 in | Wt 223.4 lb

## 2021-08-20 DIAGNOSIS — M8580 Other specified disorders of bone density and structure, unspecified site: Secondary | ICD-10-CM | POA: Diagnosis not present

## 2021-08-20 DIAGNOSIS — Z170 Estrogen receptor positive status [ER+]: Secondary | ICD-10-CM

## 2021-08-20 DIAGNOSIS — Z923 Personal history of irradiation: Secondary | ICD-10-CM | POA: Diagnosis not present

## 2021-08-20 DIAGNOSIS — Z9221 Personal history of antineoplastic chemotherapy: Secondary | ICD-10-CM | POA: Diagnosis not present

## 2021-08-20 DIAGNOSIS — Z803 Family history of malignant neoplasm of breast: Secondary | ICD-10-CM | POA: Diagnosis not present

## 2021-08-20 DIAGNOSIS — C50212 Malignant neoplasm of upper-inner quadrant of left female breast: Secondary | ICD-10-CM

## 2021-08-20 DIAGNOSIS — Z006 Encounter for examination for normal comparison and control in clinical research program: Secondary | ICD-10-CM | POA: Diagnosis not present

## 2021-08-20 DIAGNOSIS — C50812 Malignant neoplasm of overlapping sites of left female breast: Secondary | ICD-10-CM | POA: Insufficient documentation

## 2021-08-20 DIAGNOSIS — Z79811 Long term (current) use of aromatase inhibitors: Secondary | ICD-10-CM | POA: Insufficient documentation

## 2021-08-20 DIAGNOSIS — Z9012 Acquired absence of left breast and nipple: Secondary | ICD-10-CM | POA: Diagnosis not present

## 2021-08-20 DIAGNOSIS — C50912 Malignant neoplasm of unspecified site of left female breast: Secondary | ICD-10-CM

## 2021-08-20 NOTE — Progress Notes (Signed)
?[place]  ?[phone] [phone]  ? ? ?ID: Mackenzie [name] DOB: 1970/10/24  MR#: [id number]  IPJ#:[id number] ? ?Patient Care Team: ?[name], MD as PCP - General (Family Medicine) ?Sueanne Margarita, MD as PCP - Cardiology (Cardiology) ?Erroll Luna, MD as Consulting Physician (General Surgery) ?Kyung Rudd, MD as Consulting Physician (Radiation Oncology) ?Dillingham, Loel Lofty, DO as Attending Physician (Plastic Surgery) ?Windham, Phonacelle C, RN as Equities trader ?Benay Pike, MD as Consulting Physician (Hematology and Oncology) ?OTHER MD: ?  ? 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 ?CURRENT TREATMENT: letrozole, goserelin; on [name] trial (ribociclib arm) ? ? ?INTERVAL HISTORY: ? ?Mackenzie [name] returns today for follow-up of her estrogen receptor positive breast cancer.  ?She presented today to the visit with her daughter. ?Jeffie is on study with [name]-- taking Letrozole daily.  She completed 3 years of adjuvant ribociclib ?She is tolerating these medications without any major side effects that she is aware of.   ?She is concerned about not having to take goserelin but is agreeable to monitor the hormones.  She was also wondering if there is any role for routine imaging. ?Most recent bone density in October 2022 showed osteopenia/low bone mass according to [facility] criteria, T score of -1.1 ?Her most recent right screening mammography with tomography was completed at [facility] on 10/15/2020 showing: breast density category C; no evidence of malignancy.  ?Rest of the pertinent 10 point ROS reviewed and negative ? ?REVIEW OF SYSTEMS: ?Review of Systems  ?Constitutional:  Negative for appetite change, chills, fatigue, fever and unexpected weight change.  ?HENT:   Negative for hearing loss, lump/mass and trouble swallowing.   ?Eyes:  Negative for eye problems and icterus.  ?Respiratory:  Negative for chest tightness,  cough and shortness of breath.   ?Cardiovascular:  Negative for chest pain, leg swelling and palpitations.  ?Gastrointestinal:  Negative for abdominal distention, abdominal pain, constipation, diarrhea, nausea and vomiting.  ?Endocrine: Negative for hot flashes.  ?Genitourinary:  Negative for difficulty urinating.   ?Musculoskeletal:  Negative for arthralgias.  ?Skin:  Negative for itching and rash.  ?Neurological:  Negative for dizziness, extremity weakness, headaches and numbness.  ?Hematological:  Negative for adenopathy. Does not bruise/bleed easily.  ?Psychiatric/Behavioral:  Negative for depression. The patient is not nervous/anxious.   ? ? ? COVID 19 VACCINATION STATUS: Status post [place] x2 with booster September 2021 ? ? ?HISTORY OF CURRENT ILLNESS: ?From the original intake note: ? ?The patient herself noted some changes in her left breast late July 2018, she says, and eventually brought this to medical attention so that on 01/09/2017 she underwent bilateral diagnostic mammography with tomography and left breast ultrasonography at the breast Center. This found the breast density to be category C. In the upper inner quadrant of the left breast there was an area of asymmetry and there were malignant type calcifications involving all 4 quadrants. On exam there is firmness and palpable thickening in the anterior left breast with skin dimpling. Ultrasonography found at the 9:30 o'clock radiant 3 cm from the nipple a 2.7 cm mass and in the left axilla for abnormal lymph nodes largest of which measured 2.7 cm. ? ?Biopsy of the left breast 9:30 o'clock mass 80 01/26/2017 showed ([id number]) invasive ductal carcinoma, with extracellular mucin, grade 1 or 2. In the lower outer left breast is separated biopsy the same day showed ductal carcinoma in situ. One of the 4 lymph nodes involved was positive  for metastatic carcinoma. Prognostic panel on the invasive disease showed it to be estrogen receptor 100% positive,  progesterone receptor 20% positive, both with strong staining intensity, with an MIB-1 of 20%, and no HER-2 amplification with a signals ratio 1.38 and the number per cell 2.90. ? ?On 01/22/2017 the patient underwent bilateral breast MRI. This showed no involvement of the right breast, but in the left breast there was a masslike and non-masslike enhancement involving all quadrants, with skin swelling but no abnormal enhancement of the skin or nipple areolar complex or pectoralis muscle. The mass could not be clearly measured but spanned approximately 12 cm. There was bulky left axillary lymphadenopathy, with the largest lymph node measuring up to 3.4 cm. ? ?The patient's subsequent history is as detailed below. ? ? ?PAST MEDICAL HISTORY: ?Past Medical History:  ?Diagnosis Date  ? Abnormal glucose 2018  ? 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 Allergic rhinitis 2012  ? Anemia 01/27/2017  ? prior to starting chemotherapy  ? Breast cancer ([place]) 01/14/2017  ? Left breast  ? 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 Edema, lower extremity   ? Hot flashes 03/2017  ? Hyperlipidemia 03/20/2016  ? Morbid obesity with body mass index (BMI) of 40.0 to 49.9 ([place]) 09/17/2017  ? NCGS (non-celiac gluten sensitivity)   ? Pre-diabetes 07/26/2018  ? Hgb A1C elevated on 07/26/2018, Gestational Diabetes 2012  ? Seasonal allergies 2012  ? seasonal allergies causes allergic rhinitis and itchy, dry eyes per pt  ? Vitamin D deficiency 05/2015  ? ? ?PAST SURGICAL HISTORY: ?Past Surgical History:  ?Procedure Laterality Date  ? BREAST RECONSTRUCTION WITH PLACEMENT OF TISSUE EXPANDER AND FLEX HD (ACELLULAR HYDRATED DERMIS) Left 08/27/2017  ? Procedure: LEFT BREAST RECONSTRUCTION WITH PLACEMENT OF TISSUE EXPANDER AND FLEX HD;  Surgeon: Wallace Going, DO;  Location: [place];  Service: Plastics;  Laterality: Left;  ? CESAREAN SECTION    ? x2  ? LATISSIMUS FLAP TO BREAST Left 11/03/2018  ? LATISSIMUS  FLAP TO BREAST Left 11/03/2018  ? Procedure: LATISSIMUS FLAP TO LEFT BREAST;  Surgeon: Wallace Going, DO;  Location: [place];  Service: Plastics;  Laterality: Left;  ? MASTECTOMY MODIFIED RADICAL Left 08/27/2017  ? MASTECTOMY MODIFIED RADICAL Left 08/27/2017  ? Procedure: LEFT MODIFIED RADICAL MASTECTOMY;  Surgeon: Erroll Luna, MD;  Location: [place];  Service: General;  Laterality: Left;  ? MASTOPEXY Right 03/03/2019  ? Procedure: RIGHT BREAST MASTOPEXY/REDUCTION;  Surgeon: Wallace Going, DO;  Location: [place];  Service: Plastics;  Laterality: Right;  3 hours, please  ? PORT-A-CATH REMOVAL Right 08/27/2017  ? Procedure: REMOVAL PORT-A-CATH RIGHT CHEST;  Surgeon: Erroll Luna, MD;  Location: [facility];  Service: General;  Laterality: Right;  ? PORTACATH PLACEMENT Right 01/28/2017  ? Procedure: INSERTION PORT-A-CATH WITH ULTRASOUND;  Surgeon: Erroll Luna, MD;  Location: [place];  Service: General;  Laterality: Right;  ? REDUCTION MAMMAPLASTY Right   ? REMOVAL OF TISSUE EXPANDER AND PLACEMENT OF IMPLANT Left 03/03/2019  ? Procedure: REMOVAL OF TISSUE EXPANDER AND PLACEMENT OF EXPANDER;  Surgeon: Wallace Going, DO;  Location: [place] [facility];  Service: Plastics;  Laterality: Left;  ? REMOVAL OF TISSUE EXPANDER AND PLACEMENT OF IMPLANT Left 05/25/2019  ? Procedure: LEFT BREAST REMOVAL OF TISSUE EXPANDER AND PLACEMENT OF IMPLANT;  Surgeon: Wallace Going, DO;  Location: [place];  Service: Plastics;  Laterality: Left;  ? TISSUE  EXPANDER PLACEMENT Left 11/03/2018  ? Procedure: PLACEMENT OF TISSUE EXPANDER LEFT BREAST;  Surgeon: Wallace Going, DO;  Location: [facility];  Service: Plastics;  Laterality: Left;  Total case time is 3.5 hours  ? TUBAL LIGATION Bilateral 01/21/2011  ? ? ?FAMILY HISTORY ?Family History  ?Problem Relation Age of Onset  ? Other Mother   ?     V.29 from complications of surgery to remove brain tumor  ? Diabetes  Mother   ? Hypertension Mother   ? Stroke Mother   ? Kidney disease Father   ? Breast cancer Paternal Grandmother 50  ?     d.60s from breast cancer. Did not have treatment.  ? Hypertension Other   ? Diabet

## 2021-08-20 NOTE — Research (Signed)
[name] - Visit 19 ? ?[name] arrived today accompanied by her daughter for her scheduled Visit 19 on the [name] protocol. ? ?PROs:  Provided to patient at registration. Collected and reviewed for accuracy and completeness.  ? ?Labs: Blood drawn to monitor hormone levels for continued assessment of holding Goserelin injections.  Results not available today.  No labs required by study on today's visit. ? ?Vital Signs: Routine VS collected. Not required by study.   ? ?Healthcare Resource Utilization: Patient did not have any ED visits or hospitalizations since last study contact. She did not have any visits in our clinic for any issues related to her breast cancer or treatment.  ? ?Concomitant Medications: Current medication list reviewed with patient and updated in EMR.   ? ?Serious Adverse Events:  No SAEs since last study contact. Patient is now being treated for hyperthyroidism by Endocrinologist with tapazole. She denies any symptoms of hyperthyroidism.  ?  ?H & P and Clinical Eval for Recurrence:  See Dr. Rob Hickman note dated today.  ? ?Investigational Medication: Patient returned her completed medication diaries for past 3 months of Letrozole. Patient did not document any missed doses the past 3 months. She was provided with 6 more months of drug diaries for ongoing documentation of letrozole intake on study until next in clinic visit. Encouraged patient to continue taking daily as prescribed. She verbalized understanding. ? ?Plan:  Dr. Chryl Heck states okay to continue to hold goserelin until we receive hormone level results. Patient understands depending on results of hormone levels, she may need to restart Goserelin injections. We will continue to hold until all results are available and research nurse can confirm with Dr. Chryl Heck if okay to continue to hold. Patient denies any menstrual bleeding and understands to notify Dr. Chryl Heck and/or research nurse if she starts to have any vaginal bleeding  before next appointment.  ? ?Visit Scheduling - Patient will return to clinic in 6 months on 02/03/22 to see Dr. Chryl Heck and have hormone levels checked. Research nurse will call her in 3 months for study required assessment which can be completed by phone. This is due on 11/11/21. Patient aware that she should contact the clinic at any time if she has any questions or concerns prior to her next visit.  Patient thanked for her ongoing participation in the [place] trial. ? ?Foye Spurling, BSN, RN, CCRP ?Clinical Research Nurse II ? ? ?

## 2021-08-21 ENCOUNTER — Encounter: Payer: Self-pay | Admitting: Oncology

## 2021-08-21 LAB — LUTEINIZING HORMONE: LH: 8.9 m[IU]/mL

## 2021-08-21 LAB — FOLLICLE STIMULATING HORMONE: FSH: 16.6 m[IU]/mL

## 2021-08-22 ENCOUNTER — Other Ambulatory Visit (INDEPENDENT_AMBULATORY_CARE_PROVIDER_SITE_OTHER): Payer: 59

## 2021-08-22 ENCOUNTER — Other Ambulatory Visit: Payer: Self-pay

## 2021-08-22 DIAGNOSIS — E0590 Thyrotoxicosis, unspecified without thyrotoxic crisis or storm: Secondary | ICD-10-CM | POA: Diagnosis not present

## 2021-08-22 LAB — T4, FREE: Free T4: 1.16 ng/dL (ref 0.60–1.60)

## 2021-08-22 LAB — TSH: TSH: 0 u[IU]/mL — ABNORMAL LOW (ref 0.35–5.50)

## 2021-08-28 LAB — ESTRADIOL, ULTRA SENS: Estradiol, Sensitive: <2.5 pg/mL

## 2021-08-30 ENCOUNTER — Ambulatory Visit: Payer: 59 | Admitting: Hematology and Oncology

## 2021-08-30 ENCOUNTER — Other Ambulatory Visit: Payer: 59

## 2021-09-02 ENCOUNTER — Telehealth: Payer: Self-pay | Admitting: *Deleted

## 2021-09-02 NOTE — Telephone Encounter (Addendum)
[name]: Message for patient informing, per Dr. Chryl Heck, it is okay to continue to hold Goserelin based on results of recent hormone levels. Dr. Chryl Heck wants to recheck hormone levels in 3 months and next study visit is due 02/03/22.  Asked patient if it is okay to schedule the next lab for 11/11/21.  ?Requested call back to get appointments scheduled. ? ?Patient confirmed lab okay for 11/11/21 8:15 am and MD/lab on 02/03/22 in am ?Foye Spurling, BSN, RN, CCRP ?Clinical Research Nurse II ?09/02/2021 2:39 PM ?.  ?

## 2021-09-03 ENCOUNTER — Ambulatory Visit (INDEPENDENT_AMBULATORY_CARE_PROVIDER_SITE_OTHER): Payer: 59 | Admitting: Surgical

## 2021-09-03 ENCOUNTER — Other Ambulatory Visit: Payer: Self-pay

## 2021-09-03 DIAGNOSIS — C50212 Malignant neoplasm of upper-inner quadrant of left female breast: Secondary | ICD-10-CM | POA: Diagnosis not present

## 2021-09-03 DIAGNOSIS — Z170 Estrogen receptor positive status [ER+]: Secondary | ICD-10-CM

## 2021-09-03 DIAGNOSIS — Z9889 Other specified postprocedural states: Secondary | ICD-10-CM

## 2021-09-03 DIAGNOSIS — C50812 Malignant neoplasm of overlapping sites of left female breast: Secondary | ICD-10-CM

## 2021-09-03 NOTE — Progress Notes (Signed)
NIPPLE AREOLAR TATTOO PROCEDURE ? ?PREOPERATIVE DIAGNOSIS:  ?Acquired absence of left nipple areolar  ? ?POSTOPERATIVE DIAGNOSIS: ?Acquired absence of left nipple areolar   ? ?PROCEDURES: left nipple areolar tattoo  ? ?ANESTHESIA:  4% lidocaine ointment ? ?COMPLICATIONS: None. ? ?JUSTIFICATION FOR PROCEDURE:  ?Mackenzie [name] is a 51 y.o. female with a history of breast cancer status post breast reconstruction. The patient presents for nipple areolar complex tattoo. Risks, benefits, indications, and alternatives of the above described procedures were discussed with the patient and all the patient's questions were answered. Consent was signed. ? ?DESCRIPTION OF PROCEDURE: ?After informed consent was obtained and proper identification of patient and surgical site was made, the patient was taken to the procedure room and resting in procedure chair. The patient was prepped and draped in the usual sterile fashion. Attention was turned to the selection of flesh colored permanent tattoo ink to produce the appropriate color for nipple areolar complex tattooing. Color, size and location was confirmed with the patient. Pre-op photos were obtained and placed in patient chart with patient consent. Using a Digital Revo 7R pronged tattoo head pigment was instilled to the designed nipple areolar complex, which was confirmed preoperatively with the patient. Once adequate pigment had been applied to the nipple areolar complex, xeroform followed by an occlusive dressing was applied. The patient tolerated the procedure well. There were no complications ? ?A 5:1 ratio of Tan Mink and Dark Tan was used to create the desired areola color. ?A 3:1:2 ratio of Tank Mink, Dark Tan, Dark Mink was used to create the nipple coloring and texturing. ?Chipper Oman -  Exp 06/2024  ?Elvera Lennox -  Exp 06/2024  ?Dark Mink - Exp 06/2024  ? ?Recommend follow up in 6 weeks for touch up. We discussed that due to the radiation, pigment may significant fade. ? ?

## 2021-09-04 ENCOUNTER — Encounter (INDEPENDENT_AMBULATORY_CARE_PROVIDER_SITE_OTHER): Payer: Self-pay | Admitting: Adult Health

## 2021-09-04 ENCOUNTER — Other Ambulatory Visit (HOSPITAL_COMMUNITY): Payer: Self-pay

## 2021-09-04 ENCOUNTER — Ambulatory Visit (INDEPENDENT_AMBULATORY_CARE_PROVIDER_SITE_OTHER): Payer: 59 | Admitting: Adult Health

## 2021-09-04 VITALS — BP 147/86 | HR 75 | Temp 98.1°F | Ht 65.0 in | Wt 223.0 lb

## 2021-09-04 DIAGNOSIS — Z9189 Other specified personal risk factors, not elsewhere classified: Secondary | ICD-10-CM | POA: Diagnosis not present

## 2021-09-04 DIAGNOSIS — I10 Essential (primary) hypertension: Secondary | ICD-10-CM | POA: Diagnosis not present

## 2021-09-04 DIAGNOSIS — R7303 Prediabetes: Secondary | ICD-10-CM

## 2021-09-04 DIAGNOSIS — Z6837 Body mass index (BMI) 37.0-37.9, adult: Secondary | ICD-10-CM

## 2021-09-04 DIAGNOSIS — E669 Obesity, unspecified: Secondary | ICD-10-CM

## 2021-09-04 MED ORDER — METFORMIN HCL 500 MG PO TABS
500.0000 mg | ORAL_TABLET | Freq: Two times a day (BID) | ORAL | 0 refills | Status: DC
  Filled 2021-09-04: qty 180, 90d supply, fill #0

## 2021-09-10 NOTE — Progress Notes (Signed)
? ? ? ?Chief Complaint:  ? ?OBESITY ?Mackenzie [name] is here to discuss her progress with her obesity treatment plan along with follow-up of her obesity related diagnoses. Mackenzie [name] is on the Category 3 Plan and states she is following her eating plan approximately 80-85% of the time. Mackenzie [name] states she is walking for 20-30 minutes 4 times per week. ? ?Today's visit was #: 38 ?Starting weight: 267 lbs ?Starting date: 08/09/2019 ?Today's weight: 223 lbs ?Today's date: 09/04/2021 ?Total lbs lost to date: 44 lbs ?Total lbs lost since last in-office visit: 0 ? ?Interim History:  ?Per pt- ?PCP/Dr. Darron Doom, started her on metoprolol succinate XL 50 mg in February 2023 - for uncontrolled BP and elevated HR. ?Elevated HR likely secondary to hyperparathyroidism. ?BP above goal, HR stable today in office. ? ?Subjective:  ? ?1. Essential hypertension ?PCP/Dr. Darron Doom, started her on metoprolol succinate XL 50 mg in February 2023 - uncontrolled BP and elevated HR. ?Elevated HR likely secondary to hyperparathyroidism. ?She denies acute cardiac sx's. ?She denies tobacco/vape use. ? ?2. Pre-diabetes ?She is on metformin 500 mg BID and Ozempic 2 mg once weekly. ?She denies mass in neck, dysphagia, dyspepsia, persistent hoarseness, abd pain, or N/V/Constipation. ? ?3. At risk for heart disease ?Mackenzie [name] is at higher than average risk for cardiovascular disease due to hypertension, prediabetes, and obesity. ? ?Assessment/Plan:  ? ?1. Essential hypertension ?Follow-up with PCP. ?Pt. Verbalized understanding/agreement. ? ?2. Pre-diabetes ?Covert Ozempic to [facility] when out of Ozempic. ?Refill metformin 500 mg BID with meals.  Take with breakfast or lunch. ? ?- Refill metFORMIN (GLUCOPHAGE) 500 MG tablet; Take 1 tablet (500 mg total) by mouth 2 (two) times daily with a meal.  Dispense: 180 tablet; Refill: 0 ? ?3. At risk for heart disease ?Mackenzie [name] was given approximately 15 minutes of coronary artery disease prevention counseling today. She is 51  y.o. female and has risk factors for heart disease including obesity. We discussed intensive lifestyle modifications today with an emphasis on specific weight loss instructions and strategies. ? ?Repetitive spaced learning was employed today to elicit superior memory formation and behavioral change.  ? ?4. Obesity: Current BMI 37.2 ? ?Mackenzie [name] is currently in the action stage of change. As such, her goal is to continue with weight loss efforts. She has agreed to the Category 3 Plan.  ? ?Handouts:  PCP List / 100 + 200 Calorie Snack List. ? ?Exercise goals:  As is. ? ?Behavioral modification strategies: increasing lean protein intake, decreasing simple carbohydrates, meal planning and cooking strategies, keeping healthy foods in the home, and planning for success. ? ?Mackenzie [name] has agreed to follow-up with our clinic in 2-3 weeks. She was informed of the importance of frequent follow-up visits to maximize her success with intensive lifestyle modifications for her multiple health conditions.  ? ?Objective:  ? ?Blood pressure (!) 147/86, pulse 75, temperature 98.1 ?F (36.7 ?C), height '5\' 5"'$  (1.651 m), weight 223 lb (101.2 kg), last menstrual period 02/10/2017, SpO2 99 %, unknown if currently breastfeeding. ?Body mass index is 37.11 kg/m?. ? ?General: Cooperative, alert, well developed, in no acute distress. ?HEENT: Conjunctivae and lids unremarkable. ?Cardiovascular: Regular rhythm.  ?Lungs: Normal work of breathing. ?Neurologic: No focal deficits.  ? ?Lab Results  ?Component Value Date  ? CREATININE 0.50 07/23/2021  ? BUN 20 07/23/2021  ? NA 138 07/23/2021  ? K 4.0 07/23/2021  ? CL 103 07/23/2021  ? CO2 27 07/23/2021  ? ?Lab Results  ?Component Value Date  ? ALT 42 (H) 07/23/2021  ?  AST 30 07/23/2021  ? GGT 19 03/01/2021  ? ALKPHOS 102 07/23/2021  ? BILITOT 0.5 07/23/2021  ? ?Lab Results  ?Component Value Date  ? HGBA1C 5.4 05/23/2021  ? HGBA1C 5.4 02/27/2021  ? HGBA1C 5.4 11/15/2020  ? HGBA1C 5.1 06/27/2020  ? HGBA1C 5.6  01/09/2020  ? ?Lab Results  ?Component Value Date  ? INSULIN 15.1 05/23/2021  ? INSULIN 15.1 02/27/2021  ? INSULIN 11.7 11/15/2020  ? INSULIN 13.3 01/11/2020  ? INSULIN 12.6 08/09/2019  ? ?Lab Results  ?Component Value Date  ? TSH 0.00 (L) 08/22/2021  ? ?Lab Results  ?Component Value Date  ? CHOL 202 (H) 11/15/2020  ? HDL 58 11/15/2020  ? LDLCALC 135 (H) 11/15/2020  ? TRIG 52 11/15/2020  ? CHOLHDL 3.5 11/15/2020  ? ?Lab Results  ?Component Value Date  ? VD25OH 102.0 (H) 05/23/2021  ? VD25OH 83.0 02/27/2021  ? VD25OH 74.7 11/15/2020  ? ?Lab Results  ?Component Value Date  ? WBC 3.8 (L) 07/23/2021  ? HGB 11.5 (L) 07/23/2021  ? HCT 35.9 (L) 07/23/2021  ? MCV 79.6 07/23/2021  ? PLT 201.0 07/23/2021  ? ?Attestation Statements:  ? ?Reviewed by clinician on day of visit: allergies, medications, problem list, medical history, surgical history, family history, social history, and previous encounter notes. ? ?I, Water quality scientist, CMA, am acting as Location manager for [name], NP. ? ?I have reviewed the above documentation for accuracy and completeness, and I agree with the above. -  Victorious Cosio d. Winna Golla, NP-C ?

## 2021-09-26 ENCOUNTER — Other Ambulatory Visit (HOSPITAL_COMMUNITY): Payer: Self-pay

## 2021-09-26 ENCOUNTER — Encounter: Payer: Self-pay | Admitting: Oncology

## 2021-09-26 ENCOUNTER — Ambulatory Visit (INDEPENDENT_AMBULATORY_CARE_PROVIDER_SITE_OTHER): Payer: 59 | Admitting: Adult Health

## 2021-09-26 ENCOUNTER — Encounter (INDEPENDENT_AMBULATORY_CARE_PROVIDER_SITE_OTHER): Payer: Self-pay | Admitting: Adult Health

## 2021-09-26 VITALS — BP 137/80 | Temp 98.1°F | Ht 65.0 in | Wt 225.0 lb

## 2021-09-26 DIAGNOSIS — Z6838 Body mass index (BMI) 38.0-38.9, adult: Secondary | ICD-10-CM

## 2021-09-26 DIAGNOSIS — R7303 Prediabetes: Secondary | ICD-10-CM | POA: Diagnosis not present

## 2021-09-26 DIAGNOSIS — E669 Obesity, unspecified: Secondary | ICD-10-CM | POA: Diagnosis not present

## 2021-09-26 MED ORDER — SEMAGLUTIDE-WEIGHT MANAGEMENT 1.7 MG/0.75ML ~~LOC~~ SOAJ
1.7000 mg | SUBCUTANEOUS | 0 refills | Status: DC
  Filled 2021-09-26 – 2021-09-30 (×2): qty 3, 28d supply, fill #0

## 2021-09-30 ENCOUNTER — Other Ambulatory Visit (HOSPITAL_COMMUNITY): Payer: Self-pay

## 2021-09-30 ENCOUNTER — Encounter (INDEPENDENT_AMBULATORY_CARE_PROVIDER_SITE_OTHER): Payer: Self-pay

## 2021-09-30 ENCOUNTER — Telehealth (INDEPENDENT_AMBULATORY_CARE_PROVIDER_SITE_OTHER): Payer: Self-pay | Admitting: Adult Health

## 2021-09-30 NOTE — Telephone Encounter (Signed)
Prior authorization approved for [facility]. Effective: 09/27/2021 to 02/24/2022. Patient sent approval message via mychart. ?

## 2021-09-30 NOTE — Telephone Encounter (Signed)
noted 

## 2021-10-01 ENCOUNTER — Telehealth: Payer: Self-pay | Admitting: *Deleted

## 2021-10-01 NOTE — Telephone Encounter (Signed)
[name]: Left message for patient reminding her to schedule MMG which is due by 10/15/21 for the study.  ?Foye Spurling, BSN, RN, CCRP ?Clinical Research Nurse II ?10/01/2021 12:01 PM ? ?

## 2021-10-09 ENCOUNTER — Ambulatory Visit (INDEPENDENT_AMBULATORY_CARE_PROVIDER_SITE_OTHER): Payer: 59 | Admitting: Surgical

## 2021-10-09 DIAGNOSIS — Z9889 Other specified postprocedural states: Secondary | ICD-10-CM | POA: Diagnosis not present

## 2021-10-09 DIAGNOSIS — Z9012 Acquired absence of left breast and nipple: Secondary | ICD-10-CM | POA: Diagnosis not present

## 2021-10-09 DIAGNOSIS — Z170 Estrogen receptor positive status [ER+]: Secondary | ICD-10-CM

## 2021-10-09 DIAGNOSIS — C50212 Malignant neoplasm of upper-inner quadrant of left female breast: Secondary | ICD-10-CM | POA: Diagnosis not present

## 2021-10-09 DIAGNOSIS — Z719 Counseling, unspecified: Secondary | ICD-10-CM

## 2021-10-09 NOTE — Progress Notes (Signed)
? ? ? ?Chief Complaint:  ? ?OBESITY ?Mackenzie [name] is here to discuss her progress with her obesity treatment plan along with follow-up of her obesity related diagnoses. Mackenzie [name] is on the Category 3 Plan + 100-200 snack calories and states she is following her eating plan approximately 75% of the time. Mackenzie [name] states she is walking 20-30 minutes 7 times per week. ? ?Today's visit was #: 32 ?Starting weight: 267 lbs ?Starting date: 08/09/2019 ?Today's weight: 225 lbs ?Today's date: 09/26/2021 ?Total lbs lost to date: 78 ?Total lbs lost since last in-office visit: 0 ? ?Interim History:  ?When eating off plan, Mackenzie [name] will "grab quick stuff", aka; eating out at [place] or [employer]. ? ?Subjective:  ? ?1. Pre-diabetes ?She has 3 more weeks of Ozempic 2 mg.  ?She denies mass in neck, dysphagia, dyspepsia, persistent hoarseness, abd pain, of N/V/Constipation. ?She is not diabetic and current insurance will not cover this form of GLP-1 therapy in future. ? ?Assessment/Plan:  ? ?1. Pre-diabetes ?Mackenzie [name] agrees to complete Ozempic and then convert her to [facility]. ? ?2. Obesity: Current BMI 38.0 ?Mackenzie [name] is currently in the action stage of change. As such, her goal is to continue with weight loss efforts. She has agreed to the Category 3 Plan., Vegetarian  ? ?Complete Ozempic 2 mg per week for 3 more weeks, then start to [facility] 1.7 mg once weekly. ? ?RF Wegocy 1.'7mg'$  once week Disp 86m , RF 0 ? ?Exercise goals: As is ? ?Behavioral modification strategies: increasing lean protein intake, decreasing simple carbohydrates, meal planning and cooking strategies, keeping healthy foods in the home, and planning for success. ? ?[name] has agreed to follow-up with our clinic in 3 4weeks. She was informed of the importance of frequent follow-up visits to maximize her success with intensive lifestyle modifications for her multiple health conditions.  ? ?Objective:  ? ?Blood pressure 137/80, temperature 98.1 ?F (36.7 ?C), height '5\' 5"'$  (1.651 m),  weight 225 lb (102.1 kg), last menstrual period 02/10/2017, SpO2 99 %, unknown if currently breastfeeding. ?Body mass index is 37.44 kg/m?. ? ?General: Cooperative, alert, well developed, in no acute distress. ?HEENT: Conjunctivae and lids unremarkable. ?Cardiovascular: Regular rhythm.  ?Lungs: Normal work of breathing. ?Neurologic: No focal deficits.  ? ?Lab Results  ?Component Value Date  ? CREATININE 0.50 07/23/2021  ? BUN 20 07/23/2021  ? NA 138 07/23/2021  ? K 4.0 07/23/2021  ? CL 103 07/23/2021  ? CO2 27 07/23/2021  ? ?Lab Results  ?Component Value Date  ? ALT 42 (H) 07/23/2021  ? AST 30 07/23/2021  ? GGT 19 03/01/2021  ? ALKPHOS 102 07/23/2021  ? BILITOT 0.5 07/23/2021  ? ?Lab Results  ?Component Value Date  ? HGBA1C 5.4 05/23/2021  ? HGBA1C 5.4 02/27/2021  ? HGBA1C 5.4 11/15/2020  ? HGBA1C 5.1 06/27/2020  ? HGBA1C 5.6 01/09/2020  ? ?Lab Results  ?Component Value Date  ? INSULIN 15.1 05/23/2021  ? INSULIN 15.1 02/27/2021  ? INSULIN 11.7 11/15/2020  ? INSULIN 13.3 01/11/2020  ? INSULIN 12.6 08/09/2019  ? ?Lab Results  ?Component Value Date  ? TSH 0.00 (L) 08/22/2021  ? ?Lab Results  ?Component Value Date  ? CHOL 202 (H) 11/15/2020  ? HDL 58 11/15/2020  ? LDLCALC 135 (H) 11/15/2020  ? TRIG 52 11/15/2020  ? CHOLHDL 3.5 11/15/2020  ? ?Lab Results  ?Component Value Date  ? VD25OH 102.0 (H) 05/23/2021  ? VD25OH 83.0 02/27/2021  ? VD25OH 74.7 11/15/2020  ? ?Lab Results  ?Component Value Date  ?  WBC 3.8 (L) 07/23/2021  ? HGB 11.5 (L) 07/23/2021  ? HCT 35.9 (L) 07/23/2021  ? MCV 79.6 07/23/2021  ? PLT 201.0 07/23/2021  ? ?No results found for: IRON, TIBC, FERRITIN ? ?Attestation Statements:  ? ?Reviewed by clinician on day of visit: allergies, medications, problem list, medical history, surgical history, family history, social history, and previous encounter notes. ? ? ?I, Althea Charon, am acting as Location manager for [name], NP. ? ?I have reviewed the above documentation for accuracy and completeness, and I  agree with the above. -  Abdon Petrosky d. Paige Vanderwoude, NP-C  ?

## 2021-10-09 NOTE — Progress Notes (Signed)
NIPPLE AREOLAR TATTOO PROCEDURE ? ?PREOPERATIVE DIAGNOSIS:  ?Acquired absence of left nipple areolar  ? ?POSTOPERATIVE DIAGNOSIS: ?Acquired absence of left nipple areolar   ? ?PROCEDURES: left nipple areolar tattoo  ? ?ANESTHESIA:  4% lidocaine ointment ? ?COMPLICATIONS: None. ? ?JUSTIFICATION FOR PROCEDURE:  ?Mackenzie [name] is a 51 y.o. female with a history of breast cancer status post breast reconstruction. The patient presents for nipple areolar complex tattoo. Risks, benefits, indications, and alternatives of the above described procedures were discussed with the patient and all the patient's questions were answered. Consent was signed. ? ?DESCRIPTION OF PROCEDURE: ?After informed consent was obtained and proper identification of patient and surgical site was made, the patient was taken to the procedure room and resting in procedure chair. The patient was prepped and draped in the usual sterile fashion. Attention was turned to the selection of flesh colored permanent tattoo ink to produce the appropriate color for nipple areolar complex tattooing. Pre-procedure photos were obtained and placed in patient chart with patient consent. Using a Digital Revo 7R pronged tattoo head, a pigment was instilled to the designed nipple areolar complex, which was confirmed preoperatively with the patient. Once adequate pigment had been applied to the nipple areolar complex, vaseline followed by an occlusive dressing was applied. The patient tolerated the procedure well. There were no complications ? ?A ratio of tan mink to dark tan and white ink was used for an initial passed through the areola.  The ratio was 5:1:1.  ? ?Tan mink was used for the areola -expiration 06/2024 ?Dark make was used for the nipple-expiration 06/2024 ? ?Pictures were obtained of the patient and placed in the chart with the patient's or guardian's permission. ? ? ? ? ?

## 2021-10-10 ENCOUNTER — Telehealth: Payer: Self-pay | Admitting: *Deleted

## 2021-10-10 NOTE — Telephone Encounter (Signed)
[name]: LVM for patient reminding her to schedule MMG due next week at the [facility] and gave the phone number for the [place]. Asked her to return call if any questions or problems.  ?Foye Spurling, BSN, RN, CCRP ?Clinical Research Nurse II ?10/10/2021 10:39 AM ? ?

## 2021-10-11 ENCOUNTER — Encounter: Payer: Self-pay | Admitting: Oncology

## 2021-10-15 ENCOUNTER — Encounter (INDEPENDENT_AMBULATORY_CARE_PROVIDER_SITE_OTHER): Payer: Self-pay | Admitting: Adult Health

## 2021-10-15 ENCOUNTER — Ambulatory Visit (INDEPENDENT_AMBULATORY_CARE_PROVIDER_SITE_OTHER): Payer: 59 | Admitting: Adult Health

## 2021-10-15 VITALS — BP 142/80 | Temp 97.6°F | Ht 65.0 in | Wt 231.0 lb

## 2021-10-15 DIAGNOSIS — Z6838 Body mass index (BMI) 38.0-38.9, adult: Secondary | ICD-10-CM

## 2021-10-15 DIAGNOSIS — I10 Essential (primary) hypertension: Secondary | ICD-10-CM

## 2021-10-15 DIAGNOSIS — E669 Obesity, unspecified: Secondary | ICD-10-CM | POA: Diagnosis not present

## 2021-10-15 DIAGNOSIS — E0590 Thyrotoxicosis, unspecified without thyrotoxic crisis or storm: Secondary | ICD-10-CM

## 2021-10-18 ENCOUNTER — Telehealth: Payer: Self-pay | Admitting: *Deleted

## 2021-10-18 NOTE — Telephone Encounter (Signed)
Mackenzie [name]: Left message for patient reminding her to schedule Mammogram as it is due this week. Patient returned message stating she would get it scheduled.  ?Foye Spurling, BSN, RN, CCRP ?Clinical Research Nurse II ? ? ?

## 2021-10-21 ENCOUNTER — Telehealth: Payer: Self-pay | Admitting: *Deleted

## 2021-10-21 NOTE — Telephone Encounter (Signed)
[name]: Reminded patient to please make appointment for her Mammogram and gave the number for her to call the [place].  Patient states she has just been very busy and will get it scheduled by the end of this week.  ?Foye Spurling, BSN, RN, CCRP ?Clinical Research Nurse II ?10/21/2021 9:14 AM ? ?

## 2021-10-22 ENCOUNTER — Ambulatory Visit (INDEPENDENT_AMBULATORY_CARE_PROVIDER_SITE_OTHER): Payer: 59 | Admitting: Internal Medicine

## 2021-10-22 ENCOUNTER — Encounter: Payer: Self-pay | Admitting: Internal Medicine

## 2021-10-22 ENCOUNTER — Other Ambulatory Visit (HOSPITAL_COMMUNITY): Payer: Self-pay

## 2021-10-22 VITALS — BP 136/82 | HR 88 | Ht 65.0 in | Wt 237.0 lb

## 2021-10-22 DIAGNOSIS — E0500 Thyrotoxicosis with diffuse goiter without thyrotoxic crisis or storm: Secondary | ICD-10-CM

## 2021-10-22 DIAGNOSIS — E0590 Thyrotoxicosis, unspecified without thyrotoxic crisis or storm: Secondary | ICD-10-CM | POA: Diagnosis not present

## 2021-10-22 LAB — COMPREHENSIVE METABOLIC PANEL
ALT: 18 U/L (ref 0–35)
AST: 16 U/L (ref 0–37)
Albumin: 4.3 g/dL (ref 3.5–5.2)
Alkaline Phosphatase: 195 U/L — ABNORMAL HIGH (ref 39–117)
BUN: 15 mg/dL (ref 6–23)
CO2: 27 mEq/L (ref 19–32)
Calcium: 9.3 mg/dL (ref 8.4–10.5)
Chloride: 100 mEq/L (ref 96–112)
Creatinine, Ser: 0.8 mg/dL (ref 0.40–1.20)
GFR: 84.49 mL/min (ref >60.00)
Glucose, Bld: 91 mg/dL (ref 70–99)
Potassium: 3.7 mEq/L (ref 3.5–5.1)
Sodium: 134 mEq/L — ABNORMAL LOW (ref 135–145)
Total Bilirubin: 0.4 mg/dL (ref 0.2–1.2)
Total Protein: 7.6 g/dL (ref 6.0–8.3)

## 2021-10-22 LAB — TSH: TSH: 7.86 u[IU]/mL — ABNORMAL HIGH (ref 0.35–5.50)

## 2021-10-22 LAB — T4, FREE: Free T4: 0.43 ng/dL — ABNORMAL LOW (ref 0.60–1.60)

## 2021-10-22 MED ORDER — METOPROLOL SUCCINATE ER 50 MG PO TB24
50.0000 mg | ORAL_TABLET | Freq: Every day | ORAL | 0 refills | Status: DC
  Filled 2021-10-22: qty 90, 90d supply, fill #0

## 2021-10-22 MED ORDER — METHIMAZOLE 5 MG PO TABS
10.0000 mg | ORAL_TABLET | Freq: Every day | ORAL | 3 refills | Status: DC
  Filled 2021-10-22: qty 180, 90d supply, fill #0
  Filled 2022-01-27 – 2022-02-14 (×2): qty 180, 90d supply, fill #1

## 2021-10-22 NOTE — Progress Notes (Signed)
? ? ?Name: Mackenzie [name]  ?MRN/ DOB: [id number], [birth year]    ?Age/ Sex: 51 y.o., female   ? ?PCP: [name], MD   ?Reason for Endocrinology Evaluation:  Hyperthyroidism  ?   ?Date of Initial Endocrinology Evaluation: 07/22/2021  ? ? ?HPI: ?[name] is a 51 y.o. female with a past medical history of HTN, hx of  left breast Ca . The patient presented for initial endocrinology clinic visit on 07/22/2021 for consultative assistance with her  hyperthyroidism.  ? ?Pt has been diagnosed with hyperthyroidism in 05/2021 with a suppressed TSH <0.005 uIU/mL and elevated T4  and T3 at 5.96 and 417 ng/dL respectively.  ? ? ?She has no hx of osteoporosis  ?Of note, she is s/p left mastectomy due to breast ca, followed by chemo and radiation  ? ? ?Methimazole was started 07/2021 with a suppressed TSH<0.005u IU/mL, elevated free T4 at 5.96 NG/DL and elevated T3 at 417 NG/DL ?TRAb was elevated at 9.2 IU/L ? ?No fh of thyroid disease  ? ? ? ? ?SUBJECTIVE:  ? ? ?Today (10/22/21): [name] is here for follow-up on hyperthyroidism.  ? ?Denies abdominal pain or vomiting  ?Denies recent fever  ?Denies palpitations  ?Denies tremors  ?She has noted left hand numbness  ?Has burning and itching of he eyes that she attributes to pollen  ?She has been having occasional sore throat  ? ?Methimazole 10 mg twice daily ? ?HISTORY:  ?Past Medical History:  ?Past Medical History:  ?Diagnosis Date  ? Abnormal glucose 2018  ? 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 Allergic rhinitis 2012  ? Anemia 01/27/2017  ? prior to starting chemotherapy  ? Breast cancer ([place]) 01/14/2017  ? Left breast  ? 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 Edema, lower extremity   ? Hot flashes 03/2017  ? Hyperlipidemia 03/20/2016  ? Morbid obesity with body mass index (BMI) of 40.0 to 49.9 ([place]) 09/17/2017  ? NCGS (non-celiac gluten sensitivity)   ? Pre-diabetes 07/26/2018  ? Hgb A1C elevated on 07/26/2018,  Gestational Diabetes 2012  ? Seasonal allergies 2012  ? seasonal allergies causes allergic rhinitis and itchy, dry eyes per pt  ? Vitamin D deficiency 05/2015  ? ?Past Surgical History:  ?Past Surgical History:  ?Procedure Laterality Date  ? BREAST RECONSTRUCTION WITH PLACEMENT OF TISSUE EXPANDER AND FLEX HD (ACELLULAR HYDRATED DERMIS) Left 08/27/2017  ? Procedure: LEFT BREAST RECONSTRUCTION WITH PLACEMENT OF TISSUE EXPANDER AND FLEX HD;  Surgeon: Wallace Going, DO;  Location: [place];  Service: Plastics;  Laterality: Left;  ? CESAREAN SECTION    ? x2  ? LATISSIMUS FLAP TO BREAST Left 11/03/2018  ? LATISSIMUS FLAP TO BREAST Left 11/03/2018  ? Procedure: LATISSIMUS FLAP TO LEFT BREAST;  Surgeon: Wallace Going, DO;  Location: [place];  Service: Plastics;  Laterality: Left;  ? MASTECTOMY MODIFIED RADICAL Left 08/27/2017  ? MASTECTOMY MODIFIED RADICAL Left 08/27/2017  ? Procedure: LEFT MODIFIED RADICAL MASTECTOMY;  Surgeon: Erroll Luna, MD;  Location: [place];  Service: General;  Laterality: Left;  ? MASTOPEXY Right 03/03/2019  ? Procedure: RIGHT BREAST MASTOPEXY/REDUCTION;  Surgeon: Wallace Going, DO;  Location: [place];  Service: Plastics;  Laterality: Right;  3 hours, please  ? PORT-A-CATH REMOVAL Right 08/27/2017  ? Procedure: REMOVAL PORT-A-CATH RIGHT CHEST;  Surgeon: Erroll Luna, MD;  Location: [place];  Service: General;  Laterality: Right;  ? PORTACATH PLACEMENT  Right 01/28/2017  ? Procedure: INSERTION PORT-A-CATH WITH ULTRASOUND;  Surgeon: Erroll Luna, MD;  Location: [place];  Service: General;  Laterality: Right;  ? REDUCTION MAMMAPLASTY Right   ? REMOVAL OF TISSUE EXPANDER AND PLACEMENT OF IMPLANT Left 03/03/2019  ? Procedure: REMOVAL OF TISSUE EXPANDER AND PLACEMENT OF EXPANDER;  Surgeon: Wallace Going, DO;  Location: [place];  Service: Plastics;  Laterality: Left;  ? REMOVAL OF TISSUE EXPANDER AND PLACEMENT OF IMPLANT Left  05/25/2019  ? Procedure: LEFT BREAST REMOVAL OF TISSUE EXPANDER AND PLACEMENT OF IMPLANT;  Surgeon: Wallace Going, DO;  Location: [place];  Service: Plastics;  Laterality: Left;  ? TISSUE EXPANDER PLACEMENT Left 11/03/2018  ? Procedure: PLACEMENT OF TISSUE EXPANDER LEFT BREAST;  Surgeon: Wallace Going, DO;  Location: [place];  Service: Plastics;  Laterality: Left;  Total case time is 3.5 hours  ? TUBAL LIGATION Bilateral 01/21/2011  ?  ?Social History:  reports that she has never smoked. She has never used smokeless tobacco. She reports current alcohol use. She reports that she does not use drugs. ?Family History: family history includes Breast cancer (age of onset: 62) in her paternal grandmother; Diabetes in her mother and another family member; Hypertension in her mother and another family member; Kidney disease in her father; Other in her mother; Stroke in her mother and another family member. ? ? ?HOME MEDICATIONS: ?Allergies as of 10/22/2021   ? ?   Reactions  ? Dilaudid [hydromorphone] Itching  ? ?  ? ?  ?Medication List  ?  ? ?  ? Accurate as of Oct 22, 2021  8:54 AM. If you have any questions, ask your nurse or doctor.  ?  ?  ? ?  ? ?acetaminophen 500 MG tablet ?Commonly known as: TYLENOL ?Take 1,000 mg by mouth every 6 (six) hours as needed (for pain/headaches.). ?  ?calcium carbonate 500 MG chewable tablet ?Commonly known as: TUMS - dosed in mg elemental calcium ?Chew 1 tablet by mouth 2 (two) times daily. ?  ?celecoxib 200 MG capsule ?Commonly known as: CELEBREX ?Take 1 capsule (200 mg total) by mouth 2 (two) times daily. ?What changed: when to take this ?  ?cetirizine 10 MG tablet ?Commonly known as: ZYRTEC ?Take 10 mg by mouth daily. ?  ?cyclobenzaprine 10 MG tablet ?Commonly known as: FLEXERIL ?Take 0.5 tablets (5 mg total) by mouth 2 (two) times daily as needed. ?  ?Fish Oil 1000 MG Caps ?Take 1,000 mg by mouth 2 (two) times daily. ?  ?gabapentin 300 MG capsule ?Commonly  known as: NEURONTIN ?Take 1 capsule (300 mg total) by mouth 2 (two) times daily. ?  ?letrozole 2.5 MG tablet ?Commonly known as: [place] ?Take 1 tablet (2.5 mg total) by mouth daily. ?  ?loratadine 10 MG tablet ?Commonly known as: Claritin ?Take 1 tablet (10 mg total) by mouth daily. ?What changed:  ?when to take this ?reasons to take this ?  ?metFORMIN 500 MG tablet ?Commonly known as: GLUCOPHAGE ?Take 1 tablet (500 mg total) by mouth 2 (two) times daily with a meal. ?  ?methimazole 10 MG tablet ?Commonly known as: TAPAZOLE ?Take 1 tablet (10 mg total) by mouth 2 (two) times daily. ?  ?metoprolol succinate 50 MG 24 hr tablet ?Commonly known as: TOPROL-XL ?Take 1 tablet (50 mg total) by mouth daily. ?  ?multivitamin with minerals Tabs tablet ?Take 1 tablet by mouth daily. ?  ?omeprazole 40 MG capsule ?Commonly known as:  PRILOSEC ?Take 1 capsule (40 mg total) by mouth daily. ?  ?PROBIOTIC DAILY PO ?Take 1 capsule by mouth 2 (two) times daily. ?  ?VITAMIN C PO ?Take by mouth daily. ?  ?Wegovy 1.7 MG/0.75ML Soaj ?Generic drug: Semaglutide-Weight Management ?Inject 1.7 mg into the skin once a week. ?  ?ZINC OXIDE PO ?Take 1 tablet by mouth 2 (two) times a day. ?  ? ?  ?  ? ? ?REVIEW OF SYSTEMS: ?A comprehensive ROS was conducted with the patient and is negative except as per HPI  ? ? ? ?OBJECTIVE:  ?VS: BP 136/82 (BP Location: Left Arm, Patient Position: Sitting, Cuff Size: Large)   Pulse 88   Ht '5\' 5"'$  (1.651 m)   Wt 237 lb (107.5 kg)   LMP 02/10/2017   SpO2 99%   BMI 39.44 kg/m?   ? ?Wt Readings from Last 3 Encounters:  ?10/22/21 237 lb (107.5 kg)  ?10/15/21 231 lb (104.8 kg)  ?09/26/21 225 lb (102.1 kg)  ? ? ? ?EXAM: ?General: Pt appears well and is in NAD  ?Eyes: External eye exam normal without stare, lid lag or exophthalmos.  EOM intact.  PERRL.  ?Neck: General: Supple without adenopathy. ?Thyroid: Thyroid gland is prominent.  No  nodules appreciated. No thyroid bruit.  ?Lungs: Clear with good BS bilat with no  rales, rhonchi, or wheezes  ?Heart: Auscultation: RRR.  ?Abdomen: Normoactive bowel sounds, soft, nontender, without masses or organomegaly palpable  ?Extremities:  ?BL LE: No pretibial edema normal ROM and strength.

## 2021-10-22 NOTE — Progress Notes (Signed)
? ? ? ?Chief Complaint:  ? ?OBESITY ?Mackenzie [name] is here to discuss her progress with her obesity treatment plan along with follow-up of her obesity related diagnoses. Mackenzie [name] is on the Category 3 Plan and the [place] and states she is following her eating plan approximately 75-80% of the time. Mackenzie [name] states she walks at work and counts her steps. ? ?Today's visit was #: 49 ?Starting weight: 267 lbs ?Starting date: 08/09/19 ?Today's weight: 231 lbs ?Today's date: 10/15/21 ?Total lbs lost to date: 2 ?Total lbs lost since last in-office visit: gained 6 lbs. ? ?Interim History:  ?October/November 2022- she intermittently experienced chest pain. ?ED visit 04/07/2021- Complaint: CP. Subsequently referred to cardiology. ?07/26/2021-PCV cardiac stress ?Exercise treadmill stress test 07/26/2021: ?Exercise treadmill stress test performed using [place] protocol.  Patient reached 7.1 METS, and 99% of age predicted maximum heart rate.  Exercise capacity was *low normal  No chest pain reported.  Normal heart rate and hemodynamic response. Stress EKG revealed no ischemic changes. ?Low risk study. ?  ?She has 1 more dose of Ozempic 2 mg (Sunday, 10/20/2021) then will convert to [facility] 1.7 mg 10/27/2021. ? ?Subjective:  ? ?1. Essential hypertension ?She endorses epigastric with scapular pain last night. ?Currently epigastric pain only-denies chest pain/dyspnea at present. ?She denies nausea or dyspnea.   ?Both BP readings elevated at office visit. ?She is only on Toprol XL 50 mg daily. ? ?2. Hyperthyroidism ?Due to severely low TSH, referred to endocrinology. ?Started on Tapazole 2.5 mg twice daily. ?08/22/2021 TSH 0.00, T4 free 1.16-both improved. ? ?Assessment/Plan:  ? ?1. Essential hypertension ?Follow-up with PCP ?If red flag cardiac symptoms develop, seek immediate medical assistance.  Patient verbalized understanding/agreement. ? ?2. Hyperthyroidism ?Follow-up with endocrinology as directed. ? ?3. Obesity: Current BMI  38.0 ? ?Mackenzie [name] is currently in the action stage of change. As such, her goal is to continue with weight loss efforts. She has agreed to the Category 3 Plan.  ? ?Exercise goals: Walking, do not overexert until seen by PCP/cardiology. ? ?Behavioral modification strategies: increasing lean protein intake, decreasing simple carbohydrates, meal planning and cooking strategies, keeping healthy foods in the home, and planning for success. ? ?Mackenzie [name] has agreed to follow-up with our clinic in 4 weeks. She was informed of the importance of frequent follow-up visits to maximize her success with intensive lifestyle modifications for her multiple health conditions.  ? ? ?Objective:  ? ?Blood pressure (!) 142/80, temperature 97.6 ?F (36.4 ?C), height '5\' 5"'$  (1.651 m), weight 231 lb (104.8 kg), last menstrual period 02/10/2017, SpO2 100 %, unknown if currently breastfeeding. ?Body mass index is 38.44 kg/m?. ? ?General: Cooperative, alert, well developed, in no acute distress. ?HEENT: Conjunctivae and lids unremarkable. ?Cardiovascular: Regular rhythm.  ?Lungs: Normal work of breathing. ?Neurologic: No focal deficits.  ? ?Lab Results  ?Component Value Date  ? CREATININE 0.50 07/23/2021  ? BUN 20 07/23/2021  ? NA 138 07/23/2021  ? K 4.0 07/23/2021  ? CL 103 07/23/2021  ? CO2 27 07/23/2021  ? ?Lab Results  ?Component Value Date  ? ALT 42 (H) 07/23/2021  ? AST 30 07/23/2021  ? GGT 19 03/01/2021  ? ALKPHOS 102 07/23/2021  ? BILITOT 0.5 07/23/2021  ? ?Lab Results  ?Component Value Date  ? HGBA1C 5.4 05/23/2021  ? HGBA1C 5.4 02/27/2021  ? HGBA1C 5.4 11/15/2020  ? HGBA1C 5.1 06/27/2020  ? HGBA1C 5.6 01/09/2020  ? ?Lab Results  ?Component Value Date  ? INSULIN 15.1 05/23/2021  ? INSULIN 15.1 02/27/2021  ?  INSULIN 11.7 11/15/2020  ? INSULIN 13.3 01/11/2020  ? INSULIN 12.6 08/09/2019  ? ?Lab Results  ?Component Value Date  ? TSH 0.00 (L) 08/22/2021  ? ?Lab Results  ?Component Value Date  ? CHOL 202 (H) 11/15/2020  ? HDL 58 11/15/2020  ?  LDLCALC 135 (H) 11/15/2020  ? TRIG 52 11/15/2020  ? CHOLHDL 3.5 11/15/2020  ? ?Lab Results  ?Component Value Date  ? VD25OH 102.0 (H) 05/23/2021  ? VD25OH 83.0 02/27/2021  ? VD25OH 74.7 11/15/2020  ? ?Lab Results  ?Component Value Date  ? WBC 3.8 (L) 07/23/2021  ? HGB 11.5 (L) 07/23/2021  ? HCT 35.9 (L) 07/23/2021  ? MCV 79.6 07/23/2021  ? PLT 201.0 07/23/2021  ? ?No results found for: IRON, TIBC, FERRITIN ? ? ?Attestation Statements:  ? ?Reviewed by clinician on day of visit: allergies, medications, problem list, medical history, surgical history, family history, social history, and previous encounter notes. ? ?Time spent on visit including pre-visit chart review and post-visit care and charting was 30 minutes.  ? ?I, Georgianne Fick, FNP, am acting as Location manager for [name], NP. ? ?I have reviewed the above documentation for accuracy and completeness, and I agree with the above. -  Abrey Bradway d. Tiney Zipper, NP-C  ?

## 2021-10-23 LAB — T3: T3, Total: 85 ng/dL (ref 76–181)

## 2021-10-24 ENCOUNTER — Ambulatory Visit (INDEPENDENT_AMBULATORY_CARE_PROVIDER_SITE_OTHER): Payer: 59 | Admitting: Adult Health

## 2021-11-06 ENCOUNTER — Ambulatory Visit
Admission: RE | Admit: 2021-11-06 | Discharge: 2021-11-06 | Disposition: A | Discharge status: Home / Self Care | Payer: 59 | Source: Ambulatory Visit | Attending: Hematology and Oncology | Admitting: Hematology and Oncology

## 2021-11-06 DIAGNOSIS — Z170 Estrogen receptor positive status [ER+]: Secondary | ICD-10-CM

## 2021-11-06 DIAGNOSIS — Z1231 Encounter for screening mammogram for malignant neoplasm of breast: Secondary | ICD-10-CM | POA: Diagnosis not present

## 2021-11-08 ENCOUNTER — Telehealth: Payer: Self-pay | Admitting: *Deleted

## 2021-11-08 NOTE — Telephone Encounter (Signed)
Mackenzie [name]: Patient confirmed her appointment for lab to check hormone levels on Monday 6/5 at 8:15 am.

## 2021-11-11 ENCOUNTER — Other Ambulatory Visit: Payer: Self-pay

## 2021-11-11 ENCOUNTER — Inpatient Hospital Stay: Payer: 59 | Attending: Adult Health

## 2021-11-11 ENCOUNTER — Encounter: Payer: Self-pay | Admitting: *Deleted

## 2021-11-11 DIAGNOSIS — Z170 Estrogen receptor positive status [ER+]: Secondary | ICD-10-CM

## 2021-11-11 DIAGNOSIS — Z79899 Other long term (current) drug therapy: Secondary | ICD-10-CM | POA: Insufficient documentation

## 2021-11-11 DIAGNOSIS — C50812 Malignant neoplasm of overlapping sites of left female breast: Secondary | ICD-10-CM | POA: Diagnosis not present

## 2021-11-11 DIAGNOSIS — Z79811 Long term (current) use of aromatase inhibitors: Secondary | ICD-10-CM | POA: Diagnosis not present

## 2021-11-11 DIAGNOSIS — M8580 Other specified disorders of bone density and structure, unspecified site: Secondary | ICD-10-CM | POA: Insufficient documentation

## 2021-11-11 DIAGNOSIS — C50912 Malignant neoplasm of unspecified site of left female breast: Secondary | ICD-10-CM

## 2021-11-11 NOTE — Research (Signed)
Mackenzie [name] - Visit 20  [name] arrived today unaccompanied for Lab appointment and to meet briefly with research nurse for visit 20 on Mackenzie [name] Trial.   Labs: Blood drawn to monitor hormone levels for continued assessment of holding Goserelin injections.  Results not available today.  No labs required by study for this visit.    Healthcare Resource Utilization: Patient did not have any ED visits or hospitalizations since last study contact. She did not have any visits in our clinic for any issues related to her breast cancer or treatment.   Serious Adverse Events:  No SAEs since last study contact.    Plan:  Informed patient that research nurse will review results of hormone levels with Dr. Chryl Heck when they are available. Research nurse will notify patient if any changes to her treatment plan. Patient understands depending on results of hormone levels, she may need to restart Goserelin injections. Patient denies any menstrual bleeding and understands to notify Dr. Chryl Heck and/or research nurse if she starts to have any vaginal bleeding before next appointment.   Visit Scheduling - Patient will return to clinic on 02/03/22 for study visit with Dr. Chryl Heck and hormone level monitoring. Reminded patient to continue to complete Endocrine therapy diaries daily and to bring them with her on next appointment. Patient aware that she should contact the clinic at any time if she has any questions or concerns prior to her next visit.  Patient thanked for her ongoing participation in the [place] trial.  Foye Spurling, BSN, RN, [employer] Nurse II 11/11/2021 8:30 AM

## 2021-11-12 LAB — FOLLICLE STIMULATING HORMONE: FSH: 21.1 m[IU]/mL

## 2021-11-12 LAB — LUTEINIZING HORMONE: LH: 11.1 m[IU]/mL

## 2021-11-14 LAB — ESTRADIOL, ULTRA SENS: Estradiol, Sensitive: <2.5 pg/mL

## 2021-11-18 ENCOUNTER — Encounter (INDEPENDENT_AMBULATORY_CARE_PROVIDER_SITE_OTHER): Payer: Self-pay | Admitting: Adult Health

## 2021-11-18 ENCOUNTER — Other Ambulatory Visit (HOSPITAL_COMMUNITY): Payer: Self-pay

## 2021-11-18 ENCOUNTER — Ambulatory Visit (INDEPENDENT_AMBULATORY_CARE_PROVIDER_SITE_OTHER): Payer: 59 | Admitting: Adult Health

## 2021-11-18 VITALS — BP 130/87 | HR 88 | Temp 97.8°F | Ht 65.0 in | Wt 235.0 lb

## 2021-11-18 DIAGNOSIS — E669 Obesity, unspecified: Secondary | ICD-10-CM | POA: Diagnosis not present

## 2021-11-18 DIAGNOSIS — Z7985 Long-term (current) use of injectable non-insulin antidiabetic drugs: Secondary | ICD-10-CM | POA: Diagnosis not present

## 2021-11-18 DIAGNOSIS — Z6839 Body mass index (BMI) 39.0-39.9, adult: Secondary | ICD-10-CM

## 2021-11-18 DIAGNOSIS — R7303 Prediabetes: Secondary | ICD-10-CM | POA: Diagnosis not present

## 2021-11-18 DIAGNOSIS — E0590 Thyrotoxicosis, unspecified without thyrotoxic crisis or storm: Secondary | ICD-10-CM | POA: Diagnosis not present

## 2021-11-18 MED ORDER — SEMAGLUTIDE-WEIGHT MANAGEMENT 1.7 MG/0.75ML ~~LOC~~ SOAJ
1.7000 mg | SUBCUTANEOUS | 0 refills | Status: DC
  Filled 2021-11-18: qty 3, 28d supply, fill #0

## 2021-11-18 MED ORDER — GABAPENTIN 300 MG PO CAPS
300.0000 mg | ORAL_CAPSULE | Freq: Two times a day (BID) | ORAL | 0 refills | Status: DC
  Filled 2021-11-18: qty 180, 90d supply, fill #0

## 2021-11-18 MED ORDER — METFORMIN HCL 500 MG PO TABS
500.0000 mg | ORAL_TABLET | Freq: Two times a day (BID) | ORAL | 0 refills | Status: DC
  Filled 2021-11-18: qty 180, 90d supply, fill #0

## 2021-11-19 NOTE — Progress Notes (Addendum)
Chief Complaint:   OBESITY Mackenzie [name] is here to discuss her progress with her obesity treatment plan along with follow-up of her obesity related diagnoses. Mackenzie [name] is on the Category 3 Plan and states she is following her eating plan approximately 65% of the time. Mackenzie [name] states she is walking for 30 minutes 2 times per week.  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  Subjective:   1. Pre-diabetes [name] is currently on metformin 500 mg twice daily with meals and Wegovy 1.7 mg once weekly. She denies GI upset with either medication.  2. Hyperthyroidism 10/22/21 OV Notes- Endo/Dr. Kelton Pillar: HPI: [name] is a 51 y.o. female with a past medical history of HTN, hx of  left breast Ca . The patient presented for initial endocrinology clinic visit on 07/22/2021 for consultative assistance with her  hyperthyroidism.  Pt has been diagnosed with hyperthyroidism in 05/2021 with a suppressed TSH <0.005 uIU/mL and elevated T4  and T3 at 5.96 and 417 ng/dL respectively.  She has no hx of osteoporosis  Of note, she is s/p left mastectomy due to breast ca, followed by chemo and radiation    Methimazole was started 07/2021 with a suppressed TSH<0.005u IU/mL, elevated free T4 at 5.96 NG/DL and elevated T3 at 417 NG/DL TRAb was elevated at 9.2 IU/L   No fh of thyroid disease    Assessment/Plan: Patient is clinically euthyroid -No local neck symptoms -Patient is biochemically hypothyroid, we will reduce methimazole from 20 mg a day to 10 mg a day, I am also going to change her methimazole tablets from 10 mg to 5 mg  Assessment/Plan:   1. Pre-diabetes We will refill  metformin 500 mg twice daily with meals for 90 days, with no refills. - metFORMIN (GLUCOPHAGE) 500 MG tablet; Take 1 tablet (500 mg total) by mouth 2 (two) times daily with a meal.  Dispense: 180 tablet; Refill: 0  2. Hyperthyroidism Continue to reduce methimazole 5 mg twice daily, and follow-up with Endocrinology is in 8 weeks.  3. Obesity,current BMI 39.2 Adalie is currently in the action stage of change. As such, her goal is to continue with weight loss efforts. She has agreed to the Category 3 Plan.   We discussed various medication options to help Jameson with her weight loss efforts and we both agreed to refill Wegovy 1.7 mg once weekly.  - Semaglutide-Weight Management 1.7 MG/0.75ML SOAJ; Inject 1.7 mg into the skin once a week.  Dispense: 9 mL; Refill: 0  Exercise goals: Increase daily walking.  Behavioral modification strategies: increasing lean protein intake, decreasing simple carbohydrates, meal planning and cooking strategies, keeping healthy foods in the home, and planning for success.  Mackenzie [name] has agreed to follow-up with our clinic in 3 weeks. She was informed of the importance of frequent follow-up visits to maximize her success with intensive lifestyle modifications for her multiple health conditions.   Objective:   Blood pressure 130/87, pulse 88, temperature 97.8 F (36.6 C), height '5\' 5"'$  (1.651 m), weight 235 lb (106.6 kg), last menstrual period 11/28/2016, SpO2 100 %, unknown if currently breastfeeding. Body mass index is 39.11 kg/m.  General: Cooperative, alert, well developed, in no acute distress.  HEENT: Conjunctivae and lids unremarkable. Cardiovascular: Regular rhythm.  Lungs: Normal work of breathing. Neurologic: No focal deficits.   Lab Results  Component Value Date   CREATININE 0.80 10/22/2021   BUN 15 10/22/2021   NA 134 (L) 10/22/2021   K 3.7 10/22/2021   CL 100 10/22/2021   CO2 27 10/22/2021   Lab Results  Component Value Date   ALT 18  10/22/2021   AST 16 10/22/2021   GGT 19 03/01/2021   ALKPHOS 195 (H) 10/22/2021   BILITOT 0.4 10/22/2021   Lab Results  Component Value Date   HGBA1C 5.4 05/23/2021   HGBA1C 5.4 02/27/2021   HGBA1C 5.4 11/15/2020   HGBA1C 5.1 06/27/2020   HGBA1C 5.6 01/09/2020   Lab Results  Component Value Date   INSULIN 15.1 05/23/2021   INSULIN 15.1 02/27/2021   INSULIN 11.7 11/15/2020   INSULIN 13.3 01/11/2020   INSULIN 12.6 08/09/2019   Lab Results  Component Value Date   TSH 7.86 (H) 10/22/2021   Lab Results  Component Value Date   CHOL 202 (H) 11/15/2020   HDL 58 11/15/2020   LDLCALC 135 (H) 11/15/2020   TRIG 52 11/15/2020   CHOLHDL 3.5 11/15/2020   Lab Results  Component Value Date   VD25OH 102.0 (H) 05/23/2021   VD25OH 83.0 02/27/2021   VD25OH 74.7 11/15/2020   Lab Results  Component Value Date   WBC 3.8 (L) 07/23/2021   HGB 11.5 (L) 07/23/2021   HCT 35.9 (L) 07/23/2021   MCV 79.6 07/23/2021   PLT 201.0 07/23/2021   No results found for: "IRON", "TIBC", "FERRITIN"  Attestation Statements:   Reviewed by clinician on day of visit: allergies, medications, problem list, medical history, surgical history, family history, social history, and previous encounter notes.   Wilhemena Durie, am acting as transcriptionist for [name], NP.  I have reviewed the above documentation for accuracy and completeness, and I agree with the above. -  Trimaine Maser d. Delphina Schum, NP-C

## 2021-11-26 ENCOUNTER — Ambulatory Visit: Payer: 59 | Admitting: Hematology and Oncology

## 2021-12-11 ENCOUNTER — Other Ambulatory Visit (HOSPITAL_COMMUNITY): Payer: Self-pay

## 2021-12-18 ENCOUNTER — Other Ambulatory Visit (HOSPITAL_COMMUNITY): Payer: Self-pay

## 2021-12-18 ENCOUNTER — Encounter (INDEPENDENT_AMBULATORY_CARE_PROVIDER_SITE_OTHER): Payer: Self-pay | Admitting: Adult Health

## 2021-12-18 ENCOUNTER — Ambulatory Visit (INDEPENDENT_AMBULATORY_CARE_PROVIDER_SITE_OTHER): Payer: 59 | Admitting: Adult Health

## 2021-12-18 DIAGNOSIS — E559 Vitamin D deficiency, unspecified: Secondary | ICD-10-CM | POA: Diagnosis not present

## 2021-12-18 DIAGNOSIS — L0231 Cutaneous abscess of buttock: Secondary | ICD-10-CM | POA: Diagnosis not present

## 2021-12-18 DIAGNOSIS — R7301 Impaired fasting glucose: Secondary | ICD-10-CM | POA: Diagnosis not present

## 2021-12-18 DIAGNOSIS — Z0000 Encounter for general adult medical examination without abnormal findings: Secondary | ICD-10-CM | POA: Diagnosis not present

## 2021-12-18 DIAGNOSIS — I10 Essential (primary) hypertension: Secondary | ICD-10-CM | POA: Diagnosis not present

## 2021-12-18 DIAGNOSIS — R5383 Other fatigue: Secondary | ICD-10-CM | POA: Diagnosis not present

## 2021-12-18 DIAGNOSIS — E785 Hyperlipidemia, unspecified: Secondary | ICD-10-CM | POA: Diagnosis not present

## 2021-12-18 DIAGNOSIS — Z23 Encounter for immunization: Secondary | ICD-10-CM | POA: Diagnosis not present

## 2021-12-18 DIAGNOSIS — Z6841 Body Mass Index (BMI) 40.0 and over, adult: Secondary | ICD-10-CM | POA: Diagnosis not present

## 2021-12-18 DIAGNOSIS — E0590 Thyrotoxicosis, unspecified without thyrotoxic crisis or storm: Secondary | ICD-10-CM | POA: Diagnosis not present

## 2021-12-18 MED ORDER — LOSARTAN POTASSIUM 50 MG PO TABS
50.0000 mg | ORAL_TABLET | Freq: Every day | ORAL | 0 refills | Status: DC
  Filled 2021-12-18: qty 90, 90d supply, fill #0

## 2021-12-18 MED ORDER — MONTELUKAST SODIUM 10 MG PO TABS
10.0000 mg | ORAL_TABLET | Freq: Every day | ORAL | 0 refills | Status: DC
  Filled 2021-12-18: qty 90, 90d supply, fill #0

## 2021-12-18 MED ORDER — OMRON 3 SERIES BP MONITOR DEVI
0 refills | Status: DC
  Filled 2021-12-18: qty 1, 1d supply, fill #0

## 2021-12-18 MED ORDER — SULFAMETHOXAZOLE-TRIMETHOPRIM 800-160 MG PO TABS
1.0000 | ORAL_TABLET | Freq: Two times a day (BID) | ORAL | 0 refills | Status: DC
  Filled 2021-12-18: qty 10, 5d supply, fill #0

## 2021-12-19 ENCOUNTER — Encounter: Payer: Self-pay | Admitting: Oncology

## 2021-12-19 ENCOUNTER — Encounter (INDEPENDENT_AMBULATORY_CARE_PROVIDER_SITE_OTHER): Payer: Self-pay | Admitting: Adult Health

## 2021-12-19 ENCOUNTER — Other Ambulatory Visit (HOSPITAL_COMMUNITY): Payer: Self-pay

## 2021-12-19 ENCOUNTER — Ambulatory Visit (INDEPENDENT_AMBULATORY_CARE_PROVIDER_SITE_OTHER): Payer: 59 | Admitting: Adult Health

## 2021-12-19 VITALS — BP 146/81 | HR 75 | Temp 97.9°F | Ht 65.0 in | Wt 235.0 lb

## 2021-12-19 DIAGNOSIS — R7303 Prediabetes: Secondary | ICD-10-CM

## 2021-12-19 DIAGNOSIS — L0232 Furuncle of buttock: Secondary | ICD-10-CM

## 2021-12-19 DIAGNOSIS — Z6839 Body mass index (BMI) 39.0-39.9, adult: Secondary | ICD-10-CM | POA: Diagnosis not present

## 2021-12-19 DIAGNOSIS — E669 Obesity, unspecified: Secondary | ICD-10-CM

## 2021-12-19 DIAGNOSIS — E559 Vitamin D deficiency, unspecified: Secondary | ICD-10-CM | POA: Diagnosis not present

## 2021-12-19 DIAGNOSIS — Z9189 Other specified personal risk factors, not elsewhere classified: Secondary | ICD-10-CM

## 2021-12-19 MED ORDER — SEMAGLUTIDE-WEIGHT MANAGEMENT 1.7 MG/0.75ML ~~LOC~~ SOAJ
1.7000 mg | SUBCUTANEOUS | 0 refills | Status: DC
  Filled 2021-12-19: qty 3, 28d supply, fill #0

## 2021-12-20 ENCOUNTER — Other Ambulatory Visit (HOSPITAL_COMMUNITY): Payer: Self-pay

## 2021-12-20 LAB — VITAMIN D 25 HYDROXY (VIT D DEFICIENCY, FRACTURES): Vit D, 25-Hydroxy: 49.2 ng/mL (ref 30.0–100.0)

## 2021-12-20 LAB — HEMOGLOBIN A1C
Est. average glucose Bld gHb Est-mCnc: 108 mg/dL
Hgb A1c MFr Bld: 5.4 % (ref 4.8–5.6)

## 2021-12-23 NOTE — Progress Notes (Signed)
Chief Complaint:   OBESITY Mackenzie [name] is here to discuss her progress with her obesity treatment plan along with follow-up of her obesity related diagnoses. Mackenzie [name] is on the Category 3 Plan and states she is following her eating plan approximately 75% of the time. Mackenzie [name] states she is walking for 30-45 minutes 3-4 times per week.  Today's [name] was #: 52 Starting weight: 267 lbs Starting date: 08/09/2019 Today's weight: 235 lbs Today's date: 12/19/2021 Total lbs lost to date: 32 Total lbs lost since last in-office [name]: 0  Interim History:  Decline is currently on Wegovy 1.7 mg once weekly- denies current medication SE.   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  Of note: She recently received shingles #1 and COVID by Bivalent booster. She endorses fatigue s/p immunizations.  Subjective:   1. Vitamin D deficiency Mackenzie [name] is on a OTC multivitamins.   She stopped Ergocalciferol after her Vit D reading 05/2021:102.0  2. Boil of buttock, Left Mackenzie [name] reports current left boil on her buttock,. She estimated that this will recur every 6 months.   She denies pain or drainage at site.   She denies constitutional sx's. PCP recently provided Bactrim prescription, and she has yet to start antibiotic.  3. Pre-diabetes Mackenzie [name] is on metformin 500 mg twice daily with meals, and GLP-1 therapy (Wegovy 1.7 mg once weekly). She denies GI upset with either medication.  4. At risk for activity intolerance Mackenzie [name] is at risk of exercise intolerance due to boil on left buttock.  Assessment/Plan:   1. Vitamin D deficiency We will check labs today, and we will follow-up at Mackenzie [name]'s next office [name].  - VITAMIN D 25 Hydroxy (Vit-D Deficiency, Fractures)  2. Boil of buttock, Left Mackenzie [name] is to complete her full course of antibiotics.  3. Pre-diabetes We will check labs today, and we will follow-up at Mackenzie [name]'s next office [name].  - Hemoglobin  A1c  4. At risk for activity intolerance Mackenzie [name] was given approximately 15 minutes of exercise intolerance counseling today. She is 51 y.o. female and has risk factors exercise intolerance including obesity. We discussed intensive lifestyle modifications today with an emphasis on specific weight loss instructions and strategies. Mackenzie [name] will slowly increase activity as tolerated.  Repetitive spaced learning was employed today to elicit superior memory formation and behavioral change.  5. Obesity,current BMI 39.2 Mackenzie [name] is currently in the action stage of change. As such, her goal is to continue with weight loss efforts. She has agreed to the Category 3 Plan.   We discussed various medication options to help Mackenzie [name] with her weight loss efforts and we both agreed to continue Wegovy 1.7 mg once weekly, and we will refill for 1 month.  - Semaglutide-Weight Management 1.7 MG/0.75ML SOAJ; Inject 1.7 mg into the skin once a week.  Dispense: 9 mL; Refill: 0  Exercise goals: As is.   Behavioral modification strategies: increasing lean protein intake, decreasing simple carbohydrates, meal planning and cooking strategies, keeping healthy foods in the home, and planning for success.  Mackenzie [name] has agreed to follow-up with our clinic in 3 to 4 weeks. She was informed of the importance of frequent follow-up visits to maximize her success with intensive lifestyle modifications for her multiple health conditions.   Mackenzie [name] was informed we would discuss her lab results at her next [name] unless there is a critical issue that needs to be addressed sooner. Mackenzie [name] at the agreed upon time to discuss  these results.  Objective:   Blood pressure (!) 146/81, pulse 75, temperature 97.9 F (36.6 C), height 5\' 5"  (1.651 m), weight 235 lb (106.6 kg), last menstrual period 11/28/2016, SpO2 97 %, unknown if currently breastfeeding. Body mass index is 39.11 kg/m.  General: Cooperative,  alert, well developed, in no acute distress. HEENT: Conjunctivae and lids unremarkable. Cardiovascular: Regular rhythm.  Lungs: Normal work of breathing. Neurologic: No focal deficits.   Lab Results  Component Value Date   CREATININE 0.80 10/22/2021   BUN 15 10/22/2021   NA 134 (L) 10/22/2021   K 3.7 10/22/2021   CL 100 10/22/2021   CO2 27 10/22/2021   Lab Results  Component Value Date   ALT 18 10/22/2021   AST 16 10/22/2021   GGT 19 03/01/2021   ALKPHOS 195 (H) 10/22/2021   BILITOT 0.4 10/22/2021   Lab Results  Component Value Date   HGBA1C 5.4 12/19/2021   HGBA1C 5.4 05/23/2021   HGBA1C 5.4 02/27/2021   HGBA1C 5.4 11/15/2020   HGBA1C 5.1 06/27/2020   Lab Results  Component Value Date   INSULIN 15.1 05/23/2021   INSULIN 15.1 02/27/2021   INSULIN 11.7 11/15/2020   INSULIN 13.3 01/11/2020   INSULIN 12.6 08/09/2019   Lab Results  Component Value Date   TSH 7.86 (H) 10/22/2021   Lab Results  Component Value Date   CHOL 202 (H) 11/15/2020   HDL 58 11/15/2020   LDLCALC 135 (H) 11/15/2020   TRIG 52 11/15/2020   CHOLHDL 3.5 11/15/2020   Lab Results  Component Value Date   VD25OH 49.2 12/19/2021   VD25OH 102.0 (H) 05/23/2021   VD25OH 83.0 02/27/2021   Lab Results  Component Value Date   WBC 3.8 (L) 07/23/2021   HGB 11.5 (L) 07/23/2021   HCT 35.9 (L) 07/23/2021   MCV 79.6 07/23/2021   PLT 201.0 07/23/2021   No results found for: "IRON", "TIBC", "FERRITIN"  Attestation Statements:   Reviewed by clinician on day of [name]: allergies, medications, problem list, medical history, surgical history, family history, social history, and previous encounter notes.   Trude Mcburney, am acting as transcriptionist for William Hamburger, NP.  I have reviewed the above documentation for accuracy and completeness, and I agree with the above. -  Akacia Boltz d. Mackenzie Gains, NP-C

## 2021-12-24 DIAGNOSIS — L0232 Furuncle of buttock: Secondary | ICD-10-CM | POA: Insufficient documentation

## 2021-12-31 ENCOUNTER — Other Ambulatory Visit (HOSPITAL_COMMUNITY): Payer: Self-pay

## 2022-01-03 ENCOUNTER — Other Ambulatory Visit (HOSPITAL_COMMUNITY): Payer: Self-pay

## 2022-01-03 MED ORDER — ROSUVASTATIN CALCIUM 10 MG PO TABS
10.0000 mg | ORAL_TABLET | Freq: Every day | ORAL | 0 refills | Status: DC
  Filled 2022-01-03: qty 90, 90d supply, fill #0

## 2022-01-06 ENCOUNTER — Telehealth: Payer: Self-pay

## 2022-01-07 ENCOUNTER — Encounter (INDEPENDENT_AMBULATORY_CARE_PROVIDER_SITE_OTHER): Payer: Self-pay | Admitting: Adult Health

## 2022-01-07 ENCOUNTER — Other Ambulatory Visit (HOSPITAL_COMMUNITY): Payer: Self-pay

## 2022-01-07 ENCOUNTER — Ambulatory Visit (INDEPENDENT_AMBULATORY_CARE_PROVIDER_SITE_OTHER): Payer: 59 | Admitting: Adult Health

## 2022-01-07 ENCOUNTER — Other Ambulatory Visit (INDEPENDENT_AMBULATORY_CARE_PROVIDER_SITE_OTHER): Payer: 59

## 2022-01-07 VITALS — BP 129/85 | HR 85 | Temp 98.4°F | Ht 65.0 in | Wt 238.0 lb

## 2022-01-07 DIAGNOSIS — Z0000 Encounter for general adult medical examination without abnormal findings: Secondary | ICD-10-CM

## 2022-01-07 DIAGNOSIS — Z6839 Body mass index (BMI) 39.0-39.9, adult: Secondary | ICD-10-CM | POA: Diagnosis not present

## 2022-01-07 DIAGNOSIS — E559 Vitamin D deficiency, unspecified: Secondary | ICD-10-CM

## 2022-01-07 DIAGNOSIS — E0500 Thyrotoxicosis with diffuse goiter without thyrotoxic crisis or storm: Secondary | ICD-10-CM

## 2022-01-07 DIAGNOSIS — E669 Obesity, unspecified: Secondary | ICD-10-CM

## 2022-01-07 LAB — T4, FREE: Free T4: 0.7 ng/dL (ref 0.60–1.60)

## 2022-01-07 LAB — TSH: TSH: 2.8 u[IU]/mL (ref 0.35–5.50)

## 2022-01-07 MED ORDER — SEMAGLUTIDE-WEIGHT MANAGEMENT 1.7 MG/0.75ML ~~LOC~~ SOAJ
1.7000 mg | SUBCUTANEOUS | 0 refills | Status: DC
  Filled 2022-01-07 – 2022-01-10 (×2): qty 3, 28d supply, fill #0

## 2022-01-07 NOTE — Telephone Encounter (Signed)
Returned her call JJ:HERD; explained the program, asked her to reach out to provider to send referral in [place]; she works during the day so will wants to attend next [place] evening class; will ask Pam RN [facility] contact her with that schedule.

## 2022-01-08 ENCOUNTER — Ambulatory Visit (INDEPENDENT_AMBULATORY_CARE_PROVIDER_SITE_OTHER): Payer: 59 | Admitting: Surgical

## 2022-01-08 ENCOUNTER — Other Ambulatory Visit (HOSPITAL_COMMUNITY): Payer: Self-pay

## 2022-01-08 DIAGNOSIS — C50212 Malignant neoplasm of upper-inner quadrant of left female breast: Secondary | ICD-10-CM

## 2022-01-08 DIAGNOSIS — Z760 Encounter for issue of repeat prescription: Secondary | ICD-10-CM | POA: Diagnosis not present

## 2022-01-08 DIAGNOSIS — Z9889 Other specified postprocedural states: Secondary | ICD-10-CM

## 2022-01-08 DIAGNOSIS — Z170 Estrogen receptor positive status [ER+]: Secondary | ICD-10-CM

## 2022-01-08 DIAGNOSIS — Z9012 Acquired absence of left breast and nipple: Secondary | ICD-10-CM

## 2022-01-08 LAB — T3: T3, Total: 96 ng/dL (ref 76–181)

## 2022-01-08 LAB — INSULIN, RANDOM: INSULIN: 18.2 u[IU]/mL (ref 2.6–24.9)

## 2022-01-08 NOTE — Progress Notes (Signed)
Patient is a 51 year old female here for follow-up on her left breast nipple areola tattooing.  She underwent her last tattoo session on 10/09/2021, she reports today that she is overall very happy with the tattoo and does not feel as if she needs any additional tattooing today.  We did discuss that the left nipple areola is slightly darker in color, however I suspect that this will continue to lighten up over the next few months.  We discussed that in the future we could add additional color to light in the areola, however I agree that we could give it some time prior to doing this as I suspect with time it would lighten.  She is overall very pleased, we took pictures and placed him in her chart today with her permission.  We will plan to see her back on an as-needed basis, call with questions or concerns.

## 2022-01-09 NOTE — Progress Notes (Signed)
Chief Complaint:   OBESITY Mackenzie [name] is here to discuss her progress with her obesity treatment plan along with follow-up of her obesity related diagnoses. Mackenzie [name] is on the Category 3 Plan and states she is following her eating plan approximately 50-65% of the time. Mackenzie [name] states she is walking for 30 minutes 4-5 times per week.  Today's visit was #: 75 Starting weight: 267 lbs Starting date: 08/09/2019 Today's weight: 238 lbs Today's date: 01/07/22 Total lbs lost to date: 29 Total lbs lost since last in-office visit: +3  Interim History:  Since last office visit-she celebrated her birthday and traveled to [place] for 7 days.  Subjective:   1. Vitamin D deficiency 05/23/2021 vitamin D level 102-stopped vitamin D supplementation. 12/19/2021 vitamin D level 49.2  2. Healthcare maintenance Requesting referral to [facility] PREP exercise program A1c 12/19/2021 5.4. On Wegovy 1.7 mg once weekly injection-denies medication side effects.  Assessment/Plan:   1. Vitamin D deficiency Start OTC vitamin D3 2000 IU daily.  2. Healthcare maintenance Check insulin level. - Insulin, random -Referral to [facility] PREP exercise program placed.  3. Obesity,current BMI 39.6 Fasting labs at next office visit. Refill - Semaglutide-Weight Management 1.7 MG/0.75ML SOAJ; Inject 1.7 mg into the skin once a week.  Dispense: 9 mL; Refill: 0 - Amb Referral To Provider Referral Exercise Program (P.R.E.P)  Mackenzie [name] is currently in the action stage of change. As such, her goal is to continue with weight loss efforts. She has agreed to the Category 3 Plan.   Exercise goals: as is.  Behavioral modification strategies: increasing lean protein intake, decreasing simple carbohydrates, meal planning and cooking strategies, keeping healthy foods in the home, and planning for success.  Mackenzie [name] has agreed to follow-up with our clinic in 4 weeks. She was informed of the importance of frequent follow-up visits  to maximize her success with intensive lifestyle modifications for her multiple health conditions.    Objective:   Blood pressure 129/85, pulse 85, temperature 98.4 F (36.9 C), height '5\' 5"'$  (1.651 m), weight 238 lb (108 kg), last menstrual period 11/28/2016, SpO2 98 %, unknown if currently breastfeeding. Body mass index is 39.61 kg/m.  General: Cooperative, alert, well developed, in no acute distress. HEENT: Conjunctivae and lids unremarkable. Cardiovascular: Regular rhythm.  Lungs: Normal work of breathing. Neurologic: No focal deficits.   Lab Results  Component Value Date   CREATININE 0.80 10/22/2021   BUN 15 10/22/2021   NA 134 (L) 10/22/2021   K 3.7 10/22/2021   CL 100 10/22/2021   CO2 27 10/22/2021   Lab Results  Component Value Date   ALT 18 10/22/2021   AST 16 10/22/2021   GGT 19 03/01/2021   ALKPHOS 195 (H) 10/22/2021   BILITOT 0.4 10/22/2021   Lab Results  Component Value Date   HGBA1C 5.4 12/19/2021   HGBA1C 5.4 05/23/2021   HGBA1C 5.4 02/27/2021   HGBA1C 5.4 11/15/2020   HGBA1C 5.1 06/27/2020   Lab Results  Component Value Date   INSULIN 18.2 01/07/2022   INSULIN 15.1 05/23/2021   INSULIN 15.1 02/27/2021   INSULIN 11.7 11/15/2020   INSULIN 13.3 01/11/2020   Lab Results  Component Value Date   TSH 2.80 01/07/2022   Lab Results  Component Value Date   CHOL 202 (H) 11/15/2020   HDL 58 11/15/2020   LDLCALC 135 (H) 11/15/2020   TRIG 52 11/15/2020   CHOLHDL 3.5 11/15/2020   Lab Results  Component Value Date   VD25OH 49.2  12/19/2021   VD25OH 102.0 (H) 05/23/2021   VD25OH 83.0 02/27/2021   Lab Results  Component Value Date   WBC 3.8 (L) 07/23/2021   HGB 11.5 (L) 07/23/2021   HCT 35.9 (L) 07/23/2021   MCV 79.6 07/23/2021   PLT 201.0 07/23/2021   No results found for: "IRON", "TIBC", "FERRITIN"   Attestation Statements:   Reviewed by clinician on day of visit: allergies, medications, problem list, medical history, surgical history,  family history, social history, and previous encounter notes.  I, Georgianne Fick, FNP, am acting as Location manager for [name], NP.   I have reviewed the above documentation for accuracy and completeness, and I agree with the above. -  Mackenzie [name] d. Mackenzie Lindell, NP-C

## 2022-01-10 ENCOUNTER — Other Ambulatory Visit (HOSPITAL_COMMUNITY): Payer: Self-pay

## 2022-01-15 ENCOUNTER — Encounter (INDEPENDENT_AMBULATORY_CARE_PROVIDER_SITE_OTHER): Payer: Self-pay

## 2022-01-22 ENCOUNTER — Other Ambulatory Visit: Payer: Self-pay | Admitting: *Deleted

## 2022-01-22 DIAGNOSIS — C50212 Malignant neoplasm of upper-inner quadrant of left female breast: Secondary | ICD-10-CM

## 2022-01-23 DIAGNOSIS — R7301 Impaired fasting glucose: Secondary | ICD-10-CM | POA: Diagnosis not present

## 2022-01-23 DIAGNOSIS — I10 Essential (primary) hypertension: Secondary | ICD-10-CM | POA: Diagnosis not present

## 2022-01-23 DIAGNOSIS — R400 Somnolence: Secondary | ICD-10-CM | POA: Diagnosis not present

## 2022-01-23 DIAGNOSIS — E785 Hyperlipidemia, unspecified: Secondary | ICD-10-CM | POA: Diagnosis not present

## 2022-01-24 ENCOUNTER — Other Ambulatory Visit (HOSPITAL_COMMUNITY): Payer: Self-pay

## 2022-01-24 MED ORDER — SPIRONOLACTONE 25 MG PO TABS
25.0000 mg | ORAL_TABLET | Freq: Every day | ORAL | 0 refills | Status: DC
  Filled 2022-01-24: qty 90, 90d supply, fill #0

## 2022-01-27 ENCOUNTER — Other Ambulatory Visit (HOSPITAL_COMMUNITY): Payer: Self-pay

## 2022-01-27 MED ORDER — METOPROLOL SUCCINATE ER 50 MG PO TB24
50.0000 mg | ORAL_TABLET | Freq: Every day | ORAL | 0 refills | Status: DC
  Filled 2022-01-27: qty 90, 90d supply, fill #0

## 2022-01-28 ENCOUNTER — Other Ambulatory Visit (HOSPITAL_COMMUNITY): Payer: Self-pay

## 2022-01-29 ENCOUNTER — Encounter (INDEPENDENT_AMBULATORY_CARE_PROVIDER_SITE_OTHER): Payer: Self-pay | Admitting: Adult Health

## 2022-01-29 ENCOUNTER — Ambulatory Visit (INDEPENDENT_AMBULATORY_CARE_PROVIDER_SITE_OTHER): Payer: 59 | Admitting: Adult Health

## 2022-01-29 ENCOUNTER — Other Ambulatory Visit (HOSPITAL_COMMUNITY): Payer: Self-pay

## 2022-01-29 VITALS — BP 117/77 | HR 87 | Temp 98.4°F | Ht 65.0 in | Wt 233.0 lb

## 2022-01-29 DIAGNOSIS — Z9189 Other specified personal risk factors, not elsewhere classified: Secondary | ICD-10-CM

## 2022-01-29 DIAGNOSIS — E669 Obesity, unspecified: Secondary | ICD-10-CM | POA: Diagnosis not present

## 2022-01-29 DIAGNOSIS — I10 Essential (primary) hypertension: Secondary | ICD-10-CM | POA: Diagnosis not present

## 2022-01-29 DIAGNOSIS — E559 Vitamin D deficiency, unspecified: Secondary | ICD-10-CM

## 2022-01-29 DIAGNOSIS — R7303 Prediabetes: Secondary | ICD-10-CM | POA: Diagnosis not present

## 2022-01-29 DIAGNOSIS — Z6838 Body mass index (BMI) 38.0-38.9, adult: Secondary | ICD-10-CM

## 2022-01-29 MED ORDER — SEMAGLUTIDE-WEIGHT MANAGEMENT 1.7 MG/0.75ML ~~LOC~~ SOAJ
1.7000 mg | SUBCUTANEOUS | 0 refills | Status: DC
  Filled 2022-01-29 – 2022-02-14 (×2): qty 3, 28d supply, fill #0

## 2022-01-30 ENCOUNTER — Telehealth: Payer: Self-pay | Admitting: *Deleted

## 2022-01-30 LAB — VITAMIN B12: Vitamin B-12: 1478 pg/mL — ABNORMAL HIGH (ref 232–1245)

## 2022-01-30 LAB — COMPREHENSIVE METABOLIC PANEL
ALT: 15 IU/L (ref 0–32)
AST: 16 IU/L (ref 0–40)
Albumin/Globulin Ratio: 1.6 (ref 1.2–2.2)
Albumin: 4.7 g/dL (ref 3.8–4.9)
Alkaline Phosphatase: 177 IU/L — ABNORMAL HIGH (ref 44–121)
BUN/Creatinine Ratio: 18 (ref 9–23)
BUN: 16 mg/dL (ref 6–24)
Bilirubin Total: 0.3 mg/dL (ref 0.0–1.2)
CO2: 23 mmol/L (ref 20–29)
Calcium: 9.5 mg/dL (ref 8.7–10.2)
Chloride: 101 mmol/L (ref 96–106)
Creatinine, Ser: 0.88 mg/dL (ref 0.57–1.00)
Globulin, Total: 3 g/dL (ref 1.5–4.5)
Glucose: 96 mg/dL (ref 70–99)
Potassium: 4.5 mmol/L (ref 3.5–5.2)
Sodium: 137 mmol/L (ref 134–144)
Total Protein: 7.7 g/dL (ref 6.0–8.5)
eGFR: 79 mL/min/{1.73_m2} (ref >59)

## 2022-01-30 LAB — INSULIN, RANDOM: INSULIN: 21.3 u[IU]/mL (ref 2.6–24.9)

## 2022-01-30 LAB — HEMOGLOBIN A1C
Est. average glucose Bld gHb Est-mCnc: 108 mg/dL
Hgb A1c MFr Bld: 5.4 % (ref 4.8–5.6)

## 2022-01-30 LAB — VITAMIN D 25 HYDROXY (VIT D DEFICIENCY, FRACTURES): Vit D, 25-Hydroxy: 50.2 ng/mL (ref 30.0–100.0)

## 2022-01-30 NOTE — Telephone Encounter (Addendum)
[name]: Reminded patient about lab and study visit with Dr. Chryl Heck Monday morning. Informed no need to fast for labs, they are to check hormone levels and research labs.  Reminded to bring completed medication diaries.  Foye Spurling, BSN, RN, CCRP Clinical Research Nurse II 01/30/2022 10:08 AM  Patient left message to confirm the appointment. Foye Spurling, BSN, RN, [employer] Research Nurse II 01/30/2022 2:56 PM

## 2022-02-01 NOTE — Progress Notes (Unsigned)
Chief Complaint:   OBESITY Mackenzie [name] is here to discuss her progress with her obesity treatment plan along with follow-up of her obesity related diagnoses. Mackenzie [name] is on the Category 3 Plan and states she is following her eating plan approximately 75% of the time. Mackenzie [name] states she is walking 45-60 minutes 2-3 times per week.  Today's visit was #: 48 Starting weight: 267 lbs Starting date: 08/09/2019 Today's weight: 233 lbs Today's date: 01/29/2022 Total lbs lost to date: 34 lbs Total lbs lost since last in-office visit: 5 lbs  Interim History:  Per patient: PCP recently stated her on Crestor 10 mg for uncontrolled LDL and spironolactone for hair loss.  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  Subjective:   1. Essential hypertension She is on Losartan 50 mg and Metoprolol XL daily. Her PCP recently started her on Spironolactone '25mg'$  QD for hair loss. BP stable today.  2. Vitamin D deficiency She is on OTC Vitamin D-3, 2,000 IU daily and OTC multivitamin.  3. Pre-diabetes She is on Metformin 500 mg BID and Wegovy 1.7 mg once weekly.  She is tolerating both medications well .   4. At risk for complication associated with hypotension The patient is at a higher than average risk of hypotension due to weight loss and three antihypertensives.   Assessment/Plan:   1. Essential hypertension Check labs.  - Comprehensive metabolic panel  2. Vitamin D deficiency Check labs.  - VITAMIN D 25 Hydroxy (Vit-D Deficiency, Fractures)  3. Pre-diabetes Check labs.  - Hemoglobin A1c - Insulin, random - Vitamin B12  4. At risk for complication associated with hypotension Mackenzie [name] was given approximately 15 minutes of hypotension prevention counseling today. Mackenzie [name] is at risk for hypotension due to obesity. We discussed intensive lifestyle modifications today with an emphasis on weight loss as well as increasing exercise and decreasing salt intake.    Repetitive spaced learning was employed today to elicit superior memory formation and behavioral change.   5. Obesity,current BMI 38.9 Refill - Semaglutide-Weight Management 1.7 MG/0.75ML SOAJ; Inject 1.7 mg into the skin once a week.  Dispense: 9 mL; Refill: 0  Mackenzie [name] is currently in the action stage of change. As such, her goal is to continue with weight loss efforts. She has agreed to the Category 3 Plan.   Exercise goals:  As is.   Behavioral modification strategies: increasing lean protein intake, decreasing simple carbohydrates, meal planning and cooking strategies, keeping healthy foods in the home, and planning for success.  Mackenzie [name] has agreed to follow-up with our clinic in 4 weeks. She was informed of the importance of frequent follow-up visits to maximize her success with intensive lifestyle modifications for her multiple health conditions.   Mackenzie [name] was informed we would discuss her lab results at her next visit unless there is a critical issue that needs to be addressed sooner. Mackenzie [name] agreed to keep her next visit at the agreed upon time to discuss these results.  Objective:   Blood pressure 117/77, pulse 87, temperature 98.4 F (36.9 C), height '5\' 5"'$  (1.651 m), weight 233 lb (105.7 kg), last menstrual period 11/28/2016, SpO2 98 %, unknown if currently breastfeeding. Body mass index is 38.77 kg/m.  General: Cooperative, alert, well developed, in no acute distress. HEENT: Conjunctivae and lids unremarkable. Cardiovascular: Regular rhythm.  Lungs: Normal work of breathing. Neurologic: No focal deficits.   Lab Results  Component Value Date   CREATININE 0.88 01/29/2022   BUN  16 01/29/2022   NA 137 01/29/2022   K 4.5 01/29/2022   CL 101 01/29/2022   CO2 23 01/29/2022   Lab Results  Component Value Date   ALT 15 01/29/2022   AST 16 01/29/2022   GGT 19 03/01/2021   ALKPHOS 177 (H) 01/29/2022   BILITOT 0.3 01/29/2022   Lab Results  Component Value Date    HGBA1C 5.4 01/29/2022   HGBA1C 5.4 12/19/2021   HGBA1C 5.4 05/23/2021   HGBA1C 5.4 02/27/2021   HGBA1C 5.4 11/15/2020   Lab Results  Component Value Date   INSULIN 21.3 01/29/2022   INSULIN 18.2 01/07/2022   INSULIN 15.1 05/23/2021   INSULIN 15.1 02/27/2021   INSULIN 11.7 11/15/2020   Lab Results  Component Value Date   TSH 2.80 01/07/2022   Lab Results  Component Value Date   CHOL 202 (H) 11/15/2020   HDL 58 11/15/2020   LDLCALC 135 (H) 11/15/2020   TRIG 52 11/15/2020   CHOLHDL 3.5 11/15/2020   Lab Results  Component Value Date   VD25OH 50.2 01/29/2022   VD25OH 49.2 12/19/2021   VD25OH 102.0 (H) 05/23/2021   Lab Results  Component Value Date   WBC 3.8 (L) 07/23/2021   HGB 11.5 (L) 07/23/2021   HCT 35.9 (L) 07/23/2021   MCV 79.6 07/23/2021   PLT 201.0 07/23/2021   No results found for: "IRON", "TIBC", "FERRITIN"  Attestation Statements:   Reviewed by clinician on day of visit: allergies, medications, problem list, medical history, surgical history, family history, social history, and previous encounter notes.  I, Davy Pique, RMA, am acting as Location manager for [name], NP.  I have reviewed the above documentation for accuracy and completeness, and I agree with the above. -  Katy d. Danford, NP-C

## 2022-02-03 ENCOUNTER — Inpatient Hospital Stay: Payer: 59 | Attending: Adult Health

## 2022-02-03 ENCOUNTER — Other Ambulatory Visit: Payer: Self-pay

## 2022-02-03 ENCOUNTER — Inpatient Hospital Stay (HOSPITAL_BASED_OUTPATIENT_CLINIC_OR_DEPARTMENT_OTHER): Payer: 59 | Admitting: Hematology and Oncology

## 2022-02-03 ENCOUNTER — Encounter: Payer: Self-pay | Admitting: Hematology and Oncology

## 2022-02-03 ENCOUNTER — Encounter: Payer: Self-pay | Admitting: *Deleted

## 2022-02-03 VITALS — BP 136/81 | HR 80 | Temp 97.5°F | Resp 18 | Ht 65.0 in | Wt 245.1 lb

## 2022-02-03 DIAGNOSIS — C50912 Malignant neoplasm of unspecified site of left female breast: Secondary | ICD-10-CM

## 2022-02-03 DIAGNOSIS — Z170 Estrogen receptor positive status [ER+]: Secondary | ICD-10-CM

## 2022-02-03 DIAGNOSIS — C50812 Malignant neoplasm of overlapping sites of left female breast: Secondary | ICD-10-CM | POA: Insufficient documentation

## 2022-02-03 DIAGNOSIS — C50212 Malignant neoplasm of upper-inner quadrant of left female breast: Secondary | ICD-10-CM

## 2022-02-03 DIAGNOSIS — Z006 Encounter for examination for normal comparison and control in clinical research program: Secondary | ICD-10-CM | POA: Diagnosis not present

## 2022-02-03 LAB — RESEARCH LABS

## 2022-02-03 NOTE — Progress Notes (Signed)
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  Mackenzie [name] is on study with [name]-- taking Letrozole daily.  She completed 3 years of adjuvant ribociclib She was doing well, just started crestor for hypercholesterolemia. She occasionally notes some sharp pains in the left mastectomy site especially with movement in the posterior chest wall.  She also feels the medial tightness in this region. She sometimes describes a funny feeling in the right cervical region but cannot quite explain the sensation. Last bone density in October 2022 showed osteopenia/low bone mass according to [facility] criteria, T score of -1.1 Her most recent mammogram May 2023, no findings suspicious for malignancy. She in the past was on ovarian suppression but she is now on letrozole alone and being monitored with estradiol and FSH on a routine basis.  Last labs done in June 2023 consistent with postmenopausal status.  Rest of the pertinent 10 point ROS reviewed and negative  REVIEW OF SYSTEMS: Review of Systems  Constitutional:  Negative for appetite change, chills, fatigue, fever and unexpected weight change.   HENT:   Negative for hearing loss, lump/mass and trouble swallowing.   Eyes:  Negative for eye problems and icterus.  Respiratory:  Negative for chest tightness, cough and shortness of breath.   Cardiovascular:  Negative for chest pain, leg swelling and palpitations.  Gastrointestinal:  Negative for abdominal distention, abdominal pain, constipation, diarrhea, nausea and vomiting.  Endocrine: Negative for hot flashes.  Genitourinary:  Negative for difficulty urinating.   Musculoskeletal:  Negative for arthralgias.  Skin:  Negative for itching and rash.  Neurological:  Negative for dizziness, extremity weakness, headaches and numbness.  Hematological:  Negative for adenopathy. Does not bruise/bleed easily.  Psychiatric/Behavioral:  Negative for depression. The patient is not nervous/anxious.       COVID 19 VACCINATION STATUS: Status post Pfizer x2 with booster September 2021   HISTORY OF CURRENT ILLNESS: From the original intake note:  The patient herself noted some changes in her left breast late July 2018, she says, and eventually brought this to medical attention so that on 01/09/2017 she underwent bilateral diagnostic mammography with tomography and left breast ultrasonography at the breast Center. This found the breast density to be category C. In the upper inner quadrant of the left breast there was an area of asymmetry and there were malignant type calcifications involving all 4 quadrants. On exam there is firmness and palpable thickening in the anterior left breast with skin dimpling. Ultrasonography found at the 9:30 o'clock radiant 3 cm from the nipple a 2.7 cm mass and in the left axilla for abnormal lymph nodes largest of which measured 2.7 cm.  Biopsy of the left breast 9:30 o'clock mass 80 01/26/2017 showed ([id number]) invasive ductal carcinoma, with extracellular mucin, grade 1 or  2. In the lower outer left breast is separated biopsy the same day showed ductal carcinoma in  situ. One of the 4 lymph nodes involved was positive for metastatic carcinoma. Prognostic panel on the invasive disease showed it to be estrogen receptor 100% positive, progesterone receptor 20% positive, both with strong staining intensity, with an MIB-1 of 20%, and no HER-2 amplification with a signals ratio 1.38 and the number per cell 2.90.  On 01/22/2017 the patient underwent bilateral breast MRI. This showed no involvement of the right breast, but in the left breast there was a masslike and non-masslike enhancement involving all quadrants, with skin swelling but no abnormal enhancement of the skin or nipple areolar complex or pectoralis muscle. The mass could not be clearly measured but spanned approximately 12 cm. There was bulky left axillary lymphadenopathy, with the largest lymph node measuring up to 3.4 cm.  The patient's subsequent history is as detailed below.   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, left ([place]) 01/23/2017   Edema, lower extremity    Hot flashes 03/2017   Hyperlipidemia 03/20/2016   Morbid obesity with body mass index (BMI) of 40.0 to 49.9 (HCC) 09/17/2017   NCGS (non-celiac gluten sensitivity)    Pre-diabetes 07/26/2018   Hgb A1C elevated on 07/26/2018, Gestational Diabetes 2012   Seasonal allergies 2012   seasonal allergies causes allergic rhinitis and itchy, dry eyes per pt   Vitamin D deficiency 05/2015    PAST SURGICAL HISTORY: Past Surgical History:  Procedure Laterality Date   BREAST RECONSTRUCTION WITH PLACEMENT OF TISSUE EXPANDER AND FLEX HD (ACELLULAR HYDRATED DERMIS) Left 08/27/2017   Procedure: LEFT BREAST RECONSTRUCTION WITH PLACEMENT OF TISSUE EXPANDER AND FLEX HD;  Surgeon:  Wallace Going, DO;  Location: [facility];  Service: Plastics;  Laterality: Left;   CESAREAN SECTION     x2   LATISSIMUS FLAP TO BREAST Left 11/03/2018   LATISSIMUS FLAP TO BREAST Left 11/03/2018   Procedure: LATISSIMUS FLAP TO LEFT BREAST;  Surgeon: Wallace Going, DO;  Location: [place];  Service: Plastics;  Laterality: Left;   MASTECTOMY MODIFIED RADICAL Left 08/27/2017   MASTECTOMY MODIFIED RADICAL Left 08/27/2017   Procedure: LEFT MODIFIED RADICAL MASTECTOMY;  Surgeon: Erroll Luna, MD;  Location: [place];  Service: General;  Laterality: Left;   MASTOPEXY Right 03/03/2019   Procedure: RIGHT BREAST MASTOPEXY/REDUCTION;  Surgeon: Wallace Going, DO;  Location: [place];  Service: Plastics;  Laterality: Right;  3 hours, please   PORT-A-CATH REMOVAL Right 08/27/2017   Procedure: REMOVAL PORT-A-CATH RIGHT CHEST;  Surgeon: Erroll Luna, MD;  Location: [place];  Service: General;  Laterality: Right;   PORTACATH PLACEMENT Right 01/28/2017   Procedure: INSERTION PORT-A-CATH WITH ULTRASOUND;  Surgeon: Erroll Luna, MD;  Location: [place];  Service: General;  Laterality: Right;   REDUCTION MAMMAPLASTY Right    REMOVAL OF TISSUE EXPANDER AND PLACEMENT OF IMPLANT Left 03/03/2019   Procedure: REMOVAL OF TISSUE EXPANDER AND PLACEMENT OF EXPANDER;  Surgeon: Wallace Going, DO;  Location: [facility];  Service: Plastics;  Laterality: Left;   REMOVAL OF TISSUE EXPANDER AND PLACEMENT OF IMPLANT Left 05/25/2019   Procedure: LEFT BREAST REMOVAL OF  TISSUE EXPANDER AND PLACEMENT OF IMPLANT;  Surgeon: Wallace Going, DO;  Location: [place] [facility];  Service: Plastics;  Laterality: Left;   TISSUE EXPANDER PLACEMENT Left 11/03/2018   Procedure: PLACEMENT OF TISSUE EXPANDER LEFT BREAST;  Surgeon: Wallace Going, DO;  Location: [place];  Service: Plastics;  Laterality: Left;  Total case time is 3.5 hours   TUBAL LIGATION Bilateral  01/21/2011    FAMILY HISTORY Family History  Problem Relation Age of Onset   Other Mother        X.32 from complications of surgery to remove brain tumor   Diabetes Mother    Hypertension Mother    Stroke Mother    Kidney disease Father    Breast cancer Paternal Grandmother 71       d.60s from breast cancer. Did not have treatment.   Hypertension Other    Diabetes Other    Stroke Other   The patient's father still alive at age 54. The patient's mother died with a brain tumor which the patient says was "benign". She was 56. The patient has one brother, no sisters. The only breast cancer in the family is a paternal grandmother who died from breast cancer at an unknown age. There is no history of ovarian or prostate cancer in the family.   GYNECOLOGIC HISTORY:  Patient's last menstrual period was 11/28/2016. Menarche age 34 and first live birth age 88 she is [place] P4. The patient is still having regular periods as of August 2018   SOCIAL HISTORY: (Updated 05/31/2018) Meggen works at [employer] on the fifth floor, [place]. She is separated from her husband (as of 05/2018), [name], who is a Building control surveyor.  1 daughter is married and lives in [place] and works as a Education administrator. Lane's second daughter, [name], lives in [place] and works in the police department. Daughters, Ranger and [place] are 47 and 6, living at home. The patient has a grandson she looks after. She attends a [employer].     ADVANCED DIRECTIVES: At the 05/03/2020 visit the patient tells me she has named her daughter [name] as her healthcare power of attorney.  [name] may be reached at [place]: Social History   Tobacco Use   Smoking status: Never   Smokeless tobacco: Never  Vaping Use   Vaping Use: Never used  Substance Use Topics   Alcohol use: Yes    Comment: social   Drug use: No    Colonoscopy: Never  PAP: December 2017  Bone density: Never   Allergies   Allergen Reactions   Dilaudid [Hydromorphone] Itching    Current Outpatient Medications  Medication Sig Dispense Refill   acetaminophen (TYLENOL) 500 MG tablet Take 1,000 mg by mouth every 6 (six) hours as needed (for pain/headaches.).      Ascorbic Acid (VITAMIN C PO) Take by mouth daily.     Blood Pressure Monitoring (OMRON 3 SERIES BP MONITOR) DEVI Use as directed 1 each 0   calcium carbonate (TUMS - DOSED IN MG ELEMENTAL CALCIUM) 500 MG chewable tablet Chew 1 tablet by mouth 2 (two) times daily.     celecoxib (CELEBREX) 200 MG capsule Take 1 capsule (200 mg total) by mouth 2 (two) times daily. (Patient taking differently: Take 200 mg by mouth daily.) 30 capsule 3   cetirizine (ZYRTEC) 10 MG tablet Take 10 mg by mouth daily.     cyclobenzaprine (FLEXERIL) 10 MG tablet Take 0.5 tablets (5 mg total) by mouth 2 (  two) times daily as needed. 30 tablet 0   gabapentin (NEURONTIN) 300 MG capsule Take 1 capsule (300 mg total) by mouth 2 (two) times daily. 180 capsule 0   letrozole (FEMARA) 2.5 MG tablet Take 1 tablet (2.5 mg total) by mouth daily. 90 tablet 4   loratadine (CLARITIN) 10 MG tablet Take 1 tablet (10 mg total) by mouth daily. (Patient taking differently: Take 10 mg by mouth daily as needed for allergies.) 90 tablet 2   losartan (COZAAR) 50 MG tablet Take 1 tablet (50 mg total) by mouth daily. 90 tablet 0   metFORMIN (GLUCOPHAGE) 500 MG tablet Take 1 tablet (500 mg total) by mouth 2 (two) times daily with a meal. 180 tablet 0   methimazole (TAPAZOLE) 5 MG tablet Take 2 tablets (10 mg total) by mouth daily. 180 tablet 3   metoprolol succinate (TOPROL-XL) 50 MG 24 hr tablet Take 1 tablet (50 mg total) by mouth daily. 90 tablet 0   montelukast (SINGULAIR) 10 MG tablet Take 1 tablet (10 mg total) by mouth at bedtime. 90 tablet 0   Multiple Vitamin (MULTIVITAMIN WITH MINERALS) TABS tablet Take 1 tablet by mouth daily.      Omega-3 Fatty Acids (FISH OIL) 1000 MG CAPS Take 1,000 mg by mouth 2  (two) times daily.      omeprazole (PRILOSEC) 40 MG capsule Take 1 capsule (40 mg total) by mouth daily. 90 capsule 1   Probiotic Product (PROBIOTIC DAILY PO) Take 1 capsule by mouth 2 (two) times daily.      rosuvastatin (CRESTOR) 10 MG tablet Take 1 tablet (10 mg total) by mouth daily. 90 tablet 0   Semaglutide-Weight Management 1.7 MG/0.75ML SOAJ Inject 1.7 mg into the skin once a week. 9 mL 0   spironolactone (ALDACTONE) 25 MG tablet Take 1 tablet (25 mg total) by mouth daily. 90 tablet 0   ZINC OXIDE PO Take 1 tablet by mouth 2 (two) times a day.     No current facility-administered medications for this visit.   Facility-Administered Medications Ordered in Other Visits  Medication Dose Route Frequency Provider Last Rate Last Admin   sodium chloride flush (NS) 0.9 % injection 10 mL  10 mL Intravenous PRN Magrinat, [name], MD   10 mL at 03/10/17 1239    OBJECTIVE: African-American woman in no acute distress  There were no vitals filed for this visit.  Wt Readings from Last 3 Encounters:  01/29/22 233 lb (105.7 kg)  01/07/22 238 lb (108 kg)  12/19/21 235 lb (106.6 kg)   There is no height or weight on file to calculate BMI.   ECOG FS: 1  Physical Exam Constitutional:      Appearance: Normal appearance.  Cardiovascular:     Rate and Rhythm: Normal rate and regular rhythm.  Pulmonary:     Effort: Pulmonary effort is normal.     Breath sounds: Normal breath sounds.  Chest:     Comments: Left breast status postmastectomy.  No concern for recurrence.  There is an area of tenderness in the left posterior chest wall where the patient describes the pain.  There is an area of thickening in this region, could be a very taut muscle but reasonable to exclude recurrence. Musculoskeletal:        General: Normal range of motion.     Cervical back: Normal range of motion and neck supple. No rigidity.  Lymphadenopathy:     Cervical: No cervical adenopathy.  Skin:  General: Skin is  warm and dry.  Neurological:     General: No focal deficit present.     Mental Status: She is alert.      LAB RESULTS:  No visits with results within 3 Day(s) from this visit.  Latest known visit with results is:  Office Visit on 01/29/2022  Component Date Value Ref Range Status   Glucose 01/29/2022 96  70 - 99 mg/dL Final   BUN 01/29/2022 16  6 - 24 mg/dL Final   Creatinine, Ser 01/29/2022 0.88  0.57 - 1.00 mg/dL Final   eGFR 01/29/2022 79  >59 mL/min/1.73 Final   BUN/Creatinine Ratio 01/29/2022 18  9 - 23 Final   Sodium 01/29/2022 137  134 - 144 mmol/L Final   Potassium 01/29/2022 4.5  3.5 - 5.2 mmol/L Final   Chloride 01/29/2022 101  96 - 106 mmol/L Final   CO2 01/29/2022 23  20 - 29 mmol/L Final   Calcium 01/29/2022 9.5  8.7 - 10.2 mg/dL Final   Total Protein 01/29/2022 7.7  6.0 - 8.5 g/dL Final   Albumin 01/29/2022 4.7  3.8 - 4.9 g/dL Final   Globulin, Total 01/29/2022 3.0  1.5 - 4.5 g/dL Final   Albumin/Globulin Ratio 01/29/2022 1.6  1.2 - 2.2 Final   Bilirubin Total 01/29/2022 0.3  0.0 - 1.2 mg/dL Final   Alkaline Phosphatase 01/29/2022 177 (H)  44 - 121 IU/L Final   AST 01/29/2022 16  0 - 40 IU/L Final   ALT 01/29/2022 15  0 - 32 IU/L Final   Hgb A1c MFr Bld 01/29/2022 5.4  4.8 - 5.6 % Final   Comment:          Prediabetes: 5.7 - 6.4          Diabetes: >6.4          Glycemic control for adults with diabetes: <7.0    Est. average glucose Bld gHb Est-m* 01/29/2022 108  mg/dL Final   INSULIN 01/29/2022 21.3  2.6 - 24.9 uIU/mL Final   Vit D, 25-Hydroxy 01/29/2022 50.2  30.0 - 100.0 ng/mL Final   Comment: Vitamin D deficiency has been defined by the Institute of Medicine and an Endocrine Society practice guideline as a level of serum 25-OH vitamin D less than 20 ng/mL (1,2). The Endocrine Society went on to further define vitamin D insufficiency as a level between 21 and 29 ng/mL (2). 1. IOM (Institute of Medicine). 2010. Dietary reference    intakes for calcium and  D. [place]: The    [employer]. 2. Holick MF, [place], Bischoff-Ferrari HA, et al.    Evaluation, treatment, and prevention of vitamin D    deficiency: an Endocrine Society clinical practice    guideline. JCEM. 2011 Jul; 96(7):1911-30.    Vitamin B-12 01/29/2022 1,478 (H)  232 - 1,245 pg/mL Final     STUDIES: No results found.   ELIGIBLE FOR AVAILABLE RESEARCH PROTOCOL: [name], ASA study   ASSESSMENT: 51 y.o. [place] woman status post left breast upper inner quadrant biopsy 01/14/2017 for a clinical T3 N2, stage IIA invasive ductal carcinoma, grade 1 or 2, estrogen and progesterone receptor positive, HER-2 nonamplified, with an MIB-1 of 20%.  (1) staging studies: Brain MRI, bone scan, and CT scan of the chest 02/05/2017 showed no brain lesions, no lung or liver lesions, a 4.9 cm mass in the left breast with left axillary and subpectoral adenopathy, and nonspecific bone scan tracer at L2, left scapula, and anterior ribs,  with lumbar spine MRI suggested for further evaluation.  (a) lumbar spine MRI 02/17/2017 showed no normal bone lesions.  There was mild lumbar spondylosis  (2) neoadjuvant chemotherapy consisting of cyclophosphamide and doxorubicin in dose dense fashion 4 started 02/10/2017, completed 03/24/2017, followed by weekly carboplatin and gemcitabine given days 1 and 8 of each 21-day cycle starting 04/14/2017, completing the planned 4 cycles 06/26/2017  (3) is post left modified radical mastectomy on 08/27/2017 showing an mpT3 pN2 residual invasive ductal carcinoma, grade 2, with a residual cancer burden of 3.  Margins were clear  (a) latissimus flap reconstruction with expander 11/03/2018  (4) postmastectomy radiation completed 01/01/2018  (a) capecitabine radiosensitization 11/16/2017-01/01/2018  (5) goserelin started 09/21/2017  (a) letrozole started 01/11/2018  (b) enrolled in [place] clinical trial, randomized to ribociclib on 05/31/2018  (c)  Bone density on 03/16/2018 was normal with T score of -0.7 in the L1-L4 spine  (6) genetics testing 03/24/2017 through the Common Hereditary Cancer Panel offered by [facility] found no deleterious mutations in APC, ATM, AXIN2, BARD1, BMPR1A, BRCA1, BRCA2, BRIP1, CDH1, CDKN2A (p14ARF), CDKN2A (p16INK4a), CHEK2, CTNNA1, DICER1, EPCAM (Deletion/duplication testing only), GREM1 (promoter region deletion/duplication testing only), KIT, MEN1, MLH1, MSH2, MSH3, MSH6, MUTYH, NBN, NF1, NHTL1, PALB2, PDGFRA, PMS2, POLD1, POLE, PTEN, RAD50, RAD51C, RAD51D, SDHB, SDHC, SDHD, SMAD4, SMARCA4. STK11, TP53, TSC1, TSC2, and VHL.  The following genes were evaluated for sequence changes only: SDHA and HOXB13 c.251G>A variant only.    (7) Right breast reduction and left breast expander placement on 03/03/2019 with latissimus flap placement  (a) left implant implant exchanged on 05/25/2019 or a [employer]or Smooth Round Ultra High Profile Gel 750cc. Ref #[id number].  Serial Number [id number]   PLAN  Patient completed adjuvant ribociclib as part of [name] trial for 3 years.  She is currently on adjuvant letrozole. Given her significant residual disease after neoadjuvant chemotherapy Dr. Jana Hakim recommended extending the aromatase inhibitors to a full 7 years.  I am in complete agreement with this.   She understands that there is no role for routine imaging unless she has concerning clinical symptoms. Given the thickening of the chest wall and palpable tenderness on the left side, I have ordered an ultrasound of this region including axilla.  She was recommended to show the area of palpable tenderness to the ultrasound technician when she is there. Rest of the physical examination is benign We will continue to monitor her estradiol, FSH every 3 months to ensure that she does not need any ovarian suppression in addition to letrozole. Last labs from June 2023 consistent with menopausal status.  She we will repeat labs today.  She will  return to clinic for labs in 3 months and follow-up in 6 months.  Benay Pike, MD Medical Oncology and Hematology [facility] [address] [place], [place] [address] Tel. [phone]    Fax. [phone]  Total time spent: 30 minutes  *Total Encounter Time as defined by the Centers for Medicare and Medicaid Services includes, in addition to the face-to-face time of a patient visit (documented in the note above) non-face-to-face time: obtaining and reviewing outside history, ordering and reviewing medications, tests or procedures, care coordination (communications with other health care professionals or caregivers) and documentation in the medical record.

## 2022-02-03 NOTE — Research (Signed)
[name] - Visit 21  [name] arrived today for her scheduled Visit 21 on the [name] protocol.  PROs:  Provided to patient at registration. Collected and reviewed for accuracy and completeness.   Labs: Blood drawn to monitor hormone levels for continued assessment of holding Goserelin injections.  Results not available today.  Research specimens also collected today per protocol.   Healthcare Resource Utilization: Patient did not have any ED visits or hospitalizations since last study contact. She did not have any visits in our clinic for any issues related to her breast cancer or treatment.   Serious Adverse Events:  No SAEs since last study contact.     H & P and Clinical Eval for Recurrence:  See Dr. Rob Hickman note dated today.   Endocrine Treatment: Patient returned her completed medication diaries for past 6 months of Letrozole. Patient reports 4 missed doses in the past 6 months. Patient states she forgot to take her dose those days due to various reasons. Reviewed strategies to remember daily doses such as setting a timer on her phone. Patient verbalized understanding of importance of taking Letrozole daily as prescribed. She was provided with 6 more months of drug diaries for ongoing documentation of letrozole intake on study until next in clinic visit. Encouraged patient to continue taking daily as prescribed. She verbalized understanding.  Plan:  Dr. Chryl Heck states okay to continue to hold goserelin until we receive hormone level results. Patient understands that depending on results of hormone levels, she may need to restart Goserelin injections. We will continue to hold until all results are available and research nurse can confirm with Dr. Chryl Heck if okay to continue to hold. Patient denies any menstrual bleeding and understands to notify Dr. Chryl Heck and/or research nurse if she starts to have any vaginal bleeding before next appointment.  [name] of Left Breast/Axilla ordered by  Dr. Chryl Heck to evaluate tightness/pain in patient's left posterior chest area. Dr. Chryl Heck will call patient with results of [name] left lateral/posterior chest when available.   Visit Scheduling - Patient will return to clinic in 3 months on 04/28/22 for lab appointment to continue to monitor her hormone levels. Visit 22 will be completed at that time. MD appointment to see Dr. Chryl Heck and lab for hormone levels will be scheduled in 6 months for visit 23 due on 07/21/22.  Patient aware that she should contact the clinic at any time if she has any questions or concerns prior to her next visit.  Patient thanked for her ongoing participation in the [place] trial.  Foye Spurling, BSN, RN, [employer] Nurse II

## 2022-02-04 ENCOUNTER — Ambulatory Visit: Payer: 59 | Admitting: Plastic Surgery

## 2022-02-04 ENCOUNTER — Ambulatory Visit: Payer: 59 | Admitting: Student

## 2022-02-04 LAB — LUTEINIZING HORMONE: LH: 9.6 m[IU]/mL

## 2022-02-04 LAB — FOLLICLE STIMULATING HORMONE: FSH: 22.5 m[IU]/mL

## 2022-02-06 LAB — ESTRADIOL, ULTRA SENS: Estradiol, Sensitive: <2.5 pg/mL

## 2022-02-12 ENCOUNTER — Other Ambulatory Visit (HOSPITAL_COMMUNITY): Payer: Self-pay

## 2022-02-12 ENCOUNTER — Telehealth: Payer: Self-pay | Admitting: *Deleted

## 2022-02-12 NOTE — Telephone Encounter (Addendum)
Mackenzie [name]; LM informing patient that recent hormone levels are okay per Dr. Chryl Heck to continue holding the goserelin injections.  Dr. Chryl Heck wants levels checked again in 3 months and lab appointment made for 04/28/22 which is same day as next [name] visit is due.  Asked patient to let research nurse know if any questions or if she needs to reschedule.  Patient left message she confirmed appointment.  Foye Spurling, BSN, RN, [place] Nurse II 02/12/2022 1:21 PM

## 2022-02-14 ENCOUNTER — Other Ambulatory Visit: Payer: Self-pay

## 2022-02-14 ENCOUNTER — Other Ambulatory Visit (HOSPITAL_COMMUNITY): Payer: Self-pay

## 2022-02-14 MED ORDER — GABAPENTIN 300 MG PO CAPS
300.0000 mg | ORAL_CAPSULE | Freq: Two times a day (BID) | ORAL | 0 refills | Status: DC
  Filled 2022-02-14 – 2022-02-19 (×2): qty 180, 90d supply, fill #0

## 2022-02-19 ENCOUNTER — Other Ambulatory Visit (HOSPITAL_COMMUNITY): Payer: Self-pay

## 2022-02-20 ENCOUNTER — Other Ambulatory Visit (HOSPITAL_COMMUNITY): Payer: Self-pay

## 2022-02-20 DIAGNOSIS — R400 Somnolence: Secondary | ICD-10-CM | POA: Diagnosis not present

## 2022-02-20 DIAGNOSIS — E785 Hyperlipidemia, unspecified: Secondary | ICD-10-CM | POA: Diagnosis not present

## 2022-02-20 DIAGNOSIS — Z23 Encounter for immunization: Secondary | ICD-10-CM | POA: Diagnosis not present

## 2022-02-20 DIAGNOSIS — L659 Nonscarring hair loss, unspecified: Secondary | ICD-10-CM | POA: Diagnosis not present

## 2022-02-20 DIAGNOSIS — I10 Essential (primary) hypertension: Secondary | ICD-10-CM | POA: Diagnosis not present

## 2022-02-20 DIAGNOSIS — E0590 Thyrotoxicosis, unspecified without thyrotoxic crisis or storm: Secondary | ICD-10-CM | POA: Diagnosis not present

## 2022-02-20 MED ORDER — ROGAINE MENS 5 % EX FOAM
CUTANEOUS | 0 refills | Status: DC
  Filled 2022-02-20: qty 180, 90d supply, fill #0
  Filled 2022-04-24: qty 180, fill #0

## 2022-02-20 MED ORDER — CYCLOBENZAPRINE HCL 10 MG PO TABS
5.0000 mg | ORAL_TABLET | Freq: Two times a day (BID) | ORAL | 0 refills | Status: DC | PRN
  Filled 2022-02-20: qty 30, 30d supply, fill #0

## 2022-02-21 ENCOUNTER — Ambulatory Visit (INDEPENDENT_AMBULATORY_CARE_PROVIDER_SITE_OTHER): Payer: 59 | Admitting: Plastic Surgery

## 2022-02-21 ENCOUNTER — Encounter: Payer: Self-pay | Admitting: Plastic Surgery

## 2022-02-21 DIAGNOSIS — C50812 Malignant neoplasm of overlapping sites of left female breast: Secondary | ICD-10-CM | POA: Diagnosis not present

## 2022-02-21 DIAGNOSIS — Z923 Personal history of irradiation: Secondary | ICD-10-CM

## 2022-02-21 DIAGNOSIS — M549 Dorsalgia, unspecified: Secondary | ICD-10-CM | POA: Diagnosis not present

## 2022-02-21 DIAGNOSIS — Z170 Estrogen receptor positive status [ER+]: Secondary | ICD-10-CM | POA: Diagnosis not present

## 2022-02-21 NOTE — Progress Notes (Signed)
   Subjective:    Patient ID: Mackenzie [name], female    DOB: Jun 10, 1970, 51 y.o.   MRN: [id number]  The patient is a 51 year old female here for evaluation of her breast reconstruction.  She also has some concerns of left back pain.  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  This was in 2018.  She underwent chemotherapy followed by mastectomy and radiation.  It was estrogen and progesterone positive and HER2 negative.  She ended up with a latissimus muscle flap May 2020 and expander followed by implant placement.  The implant was placed in December  2020 and has a Product manager ultrahigh profile 750 cc gel implant.  Today she has concerns about left back pain.  It is in 1 particular spot.  It is tender and may be some irritation to the muscle.  She says she feels a sharp with certain movements and it only last for a few minutes.  She is not having any issues with her breasts.  She has lost 40 pounds and is working on further weight reduction.     Review of Systems  Constitutional: Negative.   Eyes: Negative.   Respiratory: Negative.  Negative for chest tightness and shortness of breath.   Gastrointestinal: Negative.   Endocrine: Negative.   Genitourinary: Negative.   Musculoskeletal:  Positive for back pain.  Skin: Negative.        Objective:   Physical Exam Constitutional:      Appearance: Normal appearance.  HENT:     Head: Normocephalic and atraumatic.  Cardiovascular:     Rate and Rhythm: Normal rate.     Pulses: Normal pulses.  Pulmonary:     Effort: Pulmonary effort is normal. No respiratory distress.  Abdominal:     General: There is no distension.     Palpations: Abdomen is soft.     Tenderness: There is no abdominal tenderness.  Skin:    General: Skin is warm.     Capillary Refill: Capillary refill takes less than 2 seconds.     Coloration: Skin is not jaundiced.     Findings: No bruising.  Neurological:     Mental Status:  She is alert and oriented to person, place, and time.  Psychiatric:        Mood and Affect: Mood normal.        Behavior: Behavior normal.        Thought Content: Thought content normal.        Assessment & Plan:     ICD-10-CM   1. Malignant 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, estrogen receptor positive ([place])  C50.812    Z17.0       I am going to set her up for physical therapy to see if that may help decrease the sensitivity in the left back area.  She is also scheduled to have a left breast ultrasound.  I think that will be fine.  If there are any concerns we may need to do an MRI.  I would like to see her back in a couple of months.  She is still interested in fat filling but realizes she should wait until she is more at her ideal weight so she does not lose the fat grafting.

## 2022-02-26 ENCOUNTER — Ambulatory Visit (INDEPENDENT_AMBULATORY_CARE_PROVIDER_SITE_OTHER): Payer: 59 | Admitting: Adult Health

## 2022-02-26 ENCOUNTER — Other Ambulatory Visit (HOSPITAL_COMMUNITY): Payer: Self-pay

## 2022-02-26 ENCOUNTER — Ambulatory Visit
Admission: RE | Admit: 2022-02-26 | Discharge: 2022-02-26 | Disposition: A | Discharge status: Home / Self Care | Payer: 59 | Source: Ambulatory Visit | Attending: Hematology and Oncology | Admitting: Hematology and Oncology

## 2022-02-26 ENCOUNTER — Encounter (INDEPENDENT_AMBULATORY_CARE_PROVIDER_SITE_OTHER): Payer: Self-pay | Admitting: Adult Health

## 2022-02-26 VITALS — BP 120/79 | HR 86 | Temp 98.1°F | Ht 65.0 in | Wt 241.0 lb

## 2022-02-26 DIAGNOSIS — Z6841 Body Mass Index (BMI) 40.0 and over, adult: Secondary | ICD-10-CM

## 2022-02-26 DIAGNOSIS — R7303 Prediabetes: Secondary | ICD-10-CM | POA: Diagnosis not present

## 2022-02-26 DIAGNOSIS — N644 Mastodynia: Secondary | ICD-10-CM | POA: Diagnosis not present

## 2022-02-26 DIAGNOSIS — Z170 Estrogen receptor positive status [ER+]: Secondary | ICD-10-CM

## 2022-02-26 DIAGNOSIS — E669 Obesity, unspecified: Secondary | ICD-10-CM

## 2022-02-26 MED ORDER — SEMAGLUTIDE-WEIGHT MANAGEMENT 1.7 MG/0.75ML ~~LOC~~ SOAJ
1.7000 mg | SUBCUTANEOUS | 0 refills | Status: DC
  Filled 2022-02-26 – 2022-03-14 (×2): qty 3, 28d supply, fill #0

## 2022-02-27 ENCOUNTER — Ambulatory Visit (INDEPENDENT_AMBULATORY_CARE_PROVIDER_SITE_OTHER): Payer: 59 | Admitting: Internal Medicine

## 2022-02-27 ENCOUNTER — Encounter: Payer: Self-pay | Admitting: Internal Medicine

## 2022-02-27 VITALS — BP 126/78 | HR 81 | Ht 65.0 in | Wt 247.0 lb

## 2022-02-27 DIAGNOSIS — E0590 Thyrotoxicosis, unspecified without thyrotoxic crisis or storm: Secondary | ICD-10-CM

## 2022-02-27 DIAGNOSIS — E0500 Thyrotoxicosis with diffuse goiter without thyrotoxic crisis or storm: Secondary | ICD-10-CM

## 2022-02-27 NOTE — Progress Notes (Signed)
Name: Mackenzie [name]  MRN/ DOB: [id number], 1970-07-01    Age/ Sex: 51 y.o., female    PCP: [name], MD   Reason for Endocrinology Evaluation:  Hyperthyroidism     Date of Initial Endocrinology Evaluation: 07/22/2021    HPI: Mackenzie [name] is a 51 y.o. female with a past medical history of HTN, hx of  left breast Ca . The patient presented for initial endocrinology clinic visit on 07/22/2021 for consultative assistance with her  hyperthyroidism.   Pt has been diagnosed with hyperthyroidism in 05/2021 with a suppressed TSH <0.005 uIU/mL and elevated T4  and T3 at 5.96 and 417 ng/dL respectively.    She has no hx of osteoporosis  Of note, she is s/p left mastectomy due to breast ca, followed by chemo and radiation    Methimazole was started 07/2021 with a suppressed TSH<0.005u IU/mL, elevated free T4 at 5.96 NG/DL and elevated T3 at 417 NG/DL TRAb was elevated at 9.2 IU/L  No fh of thyroid disease      SUBJECTIVE:    Today (02/27/22): Mackenzie [name] is here for follow-up on hyperthyroidism.   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, left ([place]) 01/23/2017   Edema, lower extremity    Hot flashes 03/2017   Hyperlipidemia 03/20/2016   Morbid obesity with body mass index (BMI) of 40.0 to 49.9 ([place]) 09/17/2017   NCGS (non-celiac gluten sensitivity)    Pre-diabetes 07/26/2018   Hgb A1C elevated on 07/26/2018, Gestational Diabetes 2012   Seasonal  allergies 2012   seasonal allergies causes allergic rhinitis and itchy, dry eyes per pt   Vitamin D deficiency 05/2015   Past Surgical History:  Past Surgical History:  Procedure Laterality Date   BREAST RECONSTRUCTION WITH PLACEMENT OF TISSUE EXPANDER AND FLEX HD (ACELLULAR HYDRATED DERMIS) Left 08/27/2017   Procedure: LEFT BREAST RECONSTRUCTION WITH PLACEMENT OF TISSUE EXPANDER AND FLEX HD;  Surgeon: Wallace Going, DO;  Location: [place];  Service: Plastics;  Laterality: Left;   CESAREAN SECTION     x2   LATISSIMUS FLAP TO BREAST Left 11/03/2018   LATISSIMUS FLAP TO BREAST Left 11/03/2018   Procedure: LATISSIMUS FLAP TO LEFT BREAST;  Surgeon: Wallace Going, DO;  Location: [place];  Service: Plastics;  Laterality: Left;   MASTECTOMY MODIFIED RADICAL Left 08/27/2017   MASTECTOMY MODIFIED RADICAL Left 08/27/2017   Procedure: LEFT MODIFIED RADICAL MASTECTOMY;  Surgeon: Erroll Luna, MD;  Location: [place];  Service: General;  Laterality: Left;   MASTOPEXY Right 03/03/2019   Procedure: RIGHT BREAST MASTOPEXY/REDUCTION;  Surgeon: Wallace Going, DO;  Location: [place];  Service: Plastics;  Laterality: Right;  3 hours, please   PORT-A-CATH REMOVAL Right 08/27/2017   Procedure: REMOVAL PORT-A-CATH RIGHT CHEST;  Surgeon: Erroll Luna, MD;  Location: [place];  Service: General;  Laterality: Right;   PORTACATH PLACEMENT Right 01/28/2017  Procedure: INSERTION PORT-A-CATH WITH ULTRASOUND;  Surgeon: Erroll Luna, MD;  Location: [place];  Service: General;  Laterality: Right;   REDUCTION MAMMAPLASTY Right    REMOVAL OF TISSUE EXPANDER AND PLACEMENT OF IMPLANT Left 03/03/2019   Procedure: REMOVAL OF TISSUE EXPANDER AND PLACEMENT OF EXPANDER;  Surgeon: Wallace Going, DO;  Location: [place];  Service: Plastics;  Laterality: Left;   REMOVAL OF TISSUE EXPANDER AND PLACEMENT OF IMPLANT Left 05/25/2019   Procedure: LEFT BREAST REMOVAL  OF TISSUE EXPANDER AND PLACEMENT OF IMPLANT;  Surgeon: Wallace Going, DO;  Location: [place];  Service: Plastics;  Laterality: Left;   TISSUE EXPANDER PLACEMENT Left 11/03/2018   Procedure: PLACEMENT OF TISSUE EXPANDER LEFT BREAST;  Surgeon: Wallace Going, DO;  Location: [place];  Service: Plastics;  Laterality: Left;  Total case time is 3.5 hours   TUBAL LIGATION Bilateral 01/21/2011    Social History:  reports that she has never smoked. She has never used smokeless tobacco. She reports current alcohol use. She reports that she does not use drugs. Family History: family history includes Breast cancer (age of onset: 20) in her paternal grandmother; Diabetes in her mother and another family member; Hypertension in her mother and another family member; Kidney disease in her father; Other in her mother; Stroke in her mother and another family member.   HOME MEDICATIONS: Allergies as of 02/27/2022       Reactions   Dilaudid [hydromorphone] Itching        Medication List        Accurate as of February 27, 2022  7:11 AM. If you have any questions, ask your nurse or doctor.          acetaminophen 500 MG tablet Commonly known as: TYLENOL Take 1,000 mg by mouth every 6 (six) hours as needed (for pain/headaches.).   calcium carbonate 500 MG chewable tablet Commonly known as: TUMS - dosed in mg elemental calcium Chew 1 tablet by mouth 2 (two) times daily.   celecoxib 200 MG capsule Commonly known as: CELEBREX Take 1 capsule (200 mg total) by mouth 2 (two) times daily. What changed: when to take this   cetirizine 10 MG tablet Commonly known as: ZYRTEC Take 10 mg by mouth daily.   cyclobenzaprine 10 MG tablet Commonly known as: FLEXERIL Take 0.5 tablets (5 mg total) by mouth 2 (two) times daily as needed.   cyclobenzaprine 10 MG tablet Commonly known as: FLEXERIL Take 0.5 tablets (5 mg total) by mouth 2 (two) times daily as needed.   Fish Oil 1000  MG Caps Take 1,000 mg by mouth 2 (two) times daily.   gabapentin 300 MG capsule Commonly known as: NEURONTIN Take 1 capsule (300 mg total) by mouth 2 (two) times daily.   letrozole 2.5 MG tablet Commonly known as: FEMARA Take 1 tablet (2.5 mg total) by mouth daily.   loratadine 10 MG tablet Commonly known as: Claritin Take 1 tablet (10 mg total) by mouth daily. What changed:  when to take this reasons to take this   losartan 50 MG tablet Commonly known as: COZAAR Take 1 tablet (50 mg total) by mouth daily.   metFORMIN 500 MG tablet Commonly known as: GLUCOPHAGE Take 1 tablet (500 mg total) by mouth 2 (two) times daily with a meal.   methimazole 5 MG tablet Commonly known as: TAPAZOLE Take 2 tablets (10 mg total) by mouth daily.   metoprolol succinate 50 MG 24 hr  tablet Commonly known as: TOPROL-XL Take 1 tablet (50 mg total) by mouth daily.   montelukast 10 MG tablet Commonly known as: SINGULAIR Take 1 tablet (10 mg total) by mouth at bedtime.   multivitamin with minerals Tabs tablet Take 1 tablet by mouth daily.   omeprazole 40 MG capsule Commonly known as: PRILOSEC Take 1 capsule (40 mg total) by mouth daily.   Omron 3 Series BP Monitor Devi Use as directed   PROBIOTIC DAILY PO Take 1 capsule by mouth 2 (two) times daily.   Rogaine Mens 5 % Foam Generic drug: Minoxidil apply twice daily as directed   rosuvastatin 10 MG tablet Commonly known as: CRESTOR Take 1 tablet (10 mg total) by mouth daily.   Semaglutide-Weight Management 1.7 MG/0.75ML Soaj Inject 1.7 mg into the skin once a week.   spironolactone 25 MG tablet Commonly known as: ALDACTONE Take 1 tablet (25 mg total) by mouth daily.   VITAMIN C PO Take by mouth daily.   ZINC OXIDE PO Take 1 tablet by mouth 2 (two) times a day.          REVIEW OF SYSTEMS: A comprehensive ROS was conducted with the patient and is negative except as per HPI     OBJECTIVE:  VS: LMP 11/28/2016     Wt Readings from Last 3 Encounters:  02/26/22 241 lb (109.3 kg)  02/03/22 245 lb 1.6 oz (111.2 kg)  01/29/22 233 lb (105.7 kg)     EXAM: General: Pt appears well and is in NAD  Eyes: External eye exam normal without stare, lid lag or exophthalmos.  EOM intact.  PERRL.  Neck: General: Supple without adenopathy. Thyroid: Thyroid gland is prominent.  No  nodules appreciated. No thyroid bruit.  Lungs: Clear with good BS bilat with no rales, rhonchi, or wheezes  Heart: Auscultation: RRR.  Abdomen: Normoactive bowel sounds, soft, nontender, without masses or organomegaly palpable  Extremities:  BL LE: No pretibial edema normal ROM and strength.  Mental Status: Judgment, insight: Intact Orientation: Oriented to time, place, and person Mood and affect: No depression, anxiety, or agitation     DATA REVIEWED:   Latest Reference Range & Units 02/27/22 09:35  TSH 0.35 - 5.50 uIU/mL 2.65  Triiodothyronine (T3) 76 - 181 ng/dL 98  T4,Free(Direct) 0.60 - 1.60 ng/dL 0.74      Latest Reference Range & Units 01/29/22 09:28  Sodium 134 - 144 mmol/L 137  Potassium 3.5 - 5.2 mmol/L 4.5  Chloride 96 - 106 mmol/L 101  CO2 20 - 29 mmol/L 23  Glucose 70 - 99 mg/dL 96  BUN 6 - 24 mg/dL 16  Creatinine 0.57 - 1.00 mg/dL 0.88  Calcium 8.7 - 10.2 mg/dL 9.5  BUN/Creatinine Ratio 9 - 23  18  eGFR >59 mL/min/1.73 79  Alkaline Phosphatase 44 - 121 IU/L 177 (H)  Albumin 3.8 - 4.9 g/dL 4.7  Albumin/Globulin Ratio 1.2 - 2.2  1.6  AST 0 - 40 IU/L 16  ALT 0 - 32 IU/L 15  Total Protein 6.0 - 8.5 g/dL 7.7  Total Bilirubin 0.0 - 1.2 mg/dL 0.3      TRAB <=2.00 IU/L 9.20 High    ASSESSMENT/PLAN/RECOMMENDATIONS:   Hyperthyroidism:   -Patient is clinically euthyroid -No local neck symptoms -TFTs remain normal and stable  Medication  Continue methimazole 5 mg, 2 tablets daily  2.  Graves' disease:   -No extrathyroidal manifestation of Graves' disease   Follow-up in 4  months  Signed electronically by: Mack Guise, MD  [facility]  [name] [address] [place], [place] [address] Phone: [phone] FAX: [phone]   CC: [name], MD [place] [address] Phone: [phone] Fax: [phone]   Return to Endocrinology clinic as below: Future Appointments  Date Time Provider Carlyss  02/27/2022  8:50 AM Charon Akamine, Melanie Crazier, MD [facility] None  03/19/2022  9:00 AM [name] D, NP [facility] None  04/09/2022  8:30 AM [name] D, NP [facility] None  04/28/2022  8:45 AM [facility] None  05/22/2022  9:40 AM Clance Boll, PA-C [facility] None  07/21/2022  8:45 AM [facility] None  07/21/2022  9:15 AM Benay Pike, MD [facility] None

## 2022-02-28 ENCOUNTER — Other Ambulatory Visit (HOSPITAL_COMMUNITY): Payer: Self-pay

## 2022-02-28 LAB — T4, FREE: Free T4: 0.74 ng/dL (ref 0.60–1.60)

## 2022-02-28 LAB — T3: T3, Total: 98 ng/dL (ref 76–181)

## 2022-02-28 LAB — TSH: TSH: 2.65 u[IU]/mL (ref 0.35–5.50)

## 2022-02-28 MED ORDER — METHIMAZOLE 5 MG PO TABS
10.0000 mg | ORAL_TABLET | Freq: Every day | ORAL | 3 refills | Status: DC
  Filled 2022-02-28 – 2022-05-08 (×2): qty 180, 90d supply, fill #0

## 2022-03-01 NOTE — Progress Notes (Unsigned)
Chief Complaint:   OBESITY Mackenzie [name] is here to discuss her progress with her obesity treatment plan along with follow-up of her obesity related diagnoses. Mackenzie [name] is on the Category 3 Plan and states she is following her eating plan approximately 65% of the time. Mackenzie [name] states she is not exercising.   Today's visit was #: 17 Starting weight: 267 lbs Starting date: 08/09/2019 Today's weight: 241 lbs Today's date: 02/26/2022 Total lbs lost to date: 26 lbs Total lbs lost since last in-office visit: +8 lbs  Interim History: 09/26/2021, Ozempic 2 mg replaced with Wegovy 1.7 mg weekly.   Subjective:   1. Pre-diabetes Currently on metformin 500 mg BID with meals and weekly Wegovy 1.7 mg.  Assessment/Plan:   1. Pre-diabetes ***  2. Obesity,current BMI 40.2 Refill - Semaglutide-Weight Management 1.7 MG/0.75ML SOAJ; Inject 1.7 mg into the skin once a week.  Dispense: 3 mL; Refill: 0  Mackenzie [name] is currently in the action stage of change. As such, her goal is to continue with weight loss efforts. She has agreed to the Category 3 Plan.   Exercise goals: All adults should avoid inactivity. Some physical activity is better than none, and adults who participate in any amount of physical activity gain some health benefits.  Behavioral modification strategies: increasing lean protein intake, decreasing simple carbohydrates, meal planning and cooking strategies, keeping healthy foods in the home, and planning for success.  Mackenzie [name] has agreed to follow-up with our clinic in 4 weeks. She was informed of the importance of frequent follow-up visits to maximize her success with intensive lifestyle modifications for her multiple health conditions.   Objective:   Blood pressure 120/79, pulse 86, temperature 98.1 F (36.7 C), height '5\' 5"'$  (1.651 m), weight 241 lb (109.3 kg), last menstrual period 11/28/2016, SpO2 98 %, unknown if currently breastfeeding. Body mass index is 40.1 kg/m.  General:  Cooperative, alert, well developed, in no acute distress. HEENT: Conjunctivae and lids unremarkable. Cardiovascular: Regular rhythm.  Lungs: Normal work of breathing. Neurologic: No focal deficits.   Lab Results  Component Value Date   CREATININE 0.88 01/29/2022   BUN 16 01/29/2022   NA 137 01/29/2022   K 4.5 01/29/2022   CL 101 01/29/2022   CO2 23 01/29/2022   Lab Results  Component Value Date   ALT 15 01/29/2022   AST 16 01/29/2022   GGT 19 03/01/2021   ALKPHOS 177 (H) 01/29/2022   BILITOT 0.3 01/29/2022   Lab Results  Component Value Date   HGBA1C 5.4 01/29/2022   HGBA1C 5.4 12/19/2021   HGBA1C 5.4 05/23/2021   HGBA1C 5.4 02/27/2021   HGBA1C 5.4 11/15/2020   Lab Results  Component Value Date   INSULIN 21.3 01/29/2022   INSULIN 18.2 01/07/2022   INSULIN 15.1 05/23/2021   INSULIN 15.1 02/27/2021   INSULIN 11.7 11/15/2020   Lab Results  Component Value Date   TSH 2.65 02/27/2022   Lab Results  Component Value Date   CHOL 202 (H) 11/15/2020   HDL 58 11/15/2020   LDLCALC 135 (H) 11/15/2020   TRIG 52 11/15/2020   CHOLHDL 3.5 11/15/2020   Lab Results  Component Value Date   VD25OH 50.2 01/29/2022   VD25OH 49.2 12/19/2021   VD25OH 102.0 (H) 05/23/2021   Lab Results  Component Value Date   WBC 3.8 (L) 07/23/2021   HGB 11.5 (L) 07/23/2021   HCT 35.9 (L) 07/23/2021   MCV 79.6 07/23/2021   PLT 201.0 07/23/2021   No results found  for: "IRON", "TIBC", "FERRITIN"  Attestation Statements:   Reviewed by clinician on day of visit: allergies, medications, problem list, medical history, surgical history, family history, social history, and previous encounter notes.  I, Davy Pique, RMA, am acting as Location manager for [name], NP.  I have reviewed the above documentation for accuracy and completeness, and I agree with the above. -  ***

## 2022-03-04 ENCOUNTER — Other Ambulatory Visit: Payer: Self-pay | Admitting: Oncology

## 2022-03-04 ENCOUNTER — Other Ambulatory Visit (HOSPITAL_COMMUNITY): Payer: Self-pay

## 2022-03-04 MED ORDER — OMEPRAZOLE 40 MG PO CPDR
40.0000 mg | DELAYED_RELEASE_CAPSULE | Freq: Every day | ORAL | 1 refills | Status: DC
  Filled 2022-03-04: qty 90, 90d supply, fill #0
  Filled 2022-06-14: qty 90, 90d supply, fill #1

## 2022-03-14 ENCOUNTER — Encounter: Payer: Self-pay | Admitting: Oncology

## 2022-03-14 ENCOUNTER — Other Ambulatory Visit (HOSPITAL_COMMUNITY): Payer: Self-pay

## 2022-03-18 ENCOUNTER — Encounter (INDEPENDENT_AMBULATORY_CARE_PROVIDER_SITE_OTHER): Payer: Self-pay

## 2022-03-18 ENCOUNTER — Telehealth (INDEPENDENT_AMBULATORY_CARE_PROVIDER_SITE_OTHER): Payer: Self-pay | Admitting: Adult Health

## 2022-03-18 ENCOUNTER — Telehealth: Payer: Self-pay

## 2022-03-18 NOTE — Telephone Encounter (Signed)
[name] - Prior authorization approved for Mackenzie [name]. Effective: 03/17/2022 to 03/17/2023. Patient sent approval message via mychart.

## 2022-03-18 NOTE — Telephone Encounter (Signed)
Attempted to reach pt for next evening PREP class.  Unable to leave a message due to mailbox being full.  Sent text message

## 2022-03-19 ENCOUNTER — Ambulatory Visit (INDEPENDENT_AMBULATORY_CARE_PROVIDER_SITE_OTHER): Payer: 59 | Admitting: Adult Health

## 2022-03-19 ENCOUNTER — Encounter (INDEPENDENT_AMBULATORY_CARE_PROVIDER_SITE_OTHER): Payer: Self-pay | Admitting: Adult Health

## 2022-03-19 ENCOUNTER — Other Ambulatory Visit (HOSPITAL_COMMUNITY): Payer: Self-pay

## 2022-03-19 DIAGNOSIS — E669 Obesity, unspecified: Secondary | ICD-10-CM

## 2022-03-19 DIAGNOSIS — Z6841 Body Mass Index (BMI) 40.0 and over, adult: Secondary | ICD-10-CM

## 2022-03-19 DIAGNOSIS — R7303 Prediabetes: Secondary | ICD-10-CM

## 2022-03-19 MED ORDER — SEMAGLUTIDE-WEIGHT MANAGEMENT 1.7 MG/0.75ML ~~LOC~~ SOAJ
1.7000 mg | SUBCUTANEOUS | 0 refills | Status: DC
  Filled 2022-03-19: qty 3, 28d supply, fill #0

## 2022-03-19 MED ORDER — METFORMIN HCL 500 MG PO TABS
500.0000 mg | ORAL_TABLET | Freq: Two times a day (BID) | ORAL | 0 refills | Status: DC
  Filled 2022-03-19: qty 180, 90d supply, fill #0

## 2022-03-21 ENCOUNTER — Other Ambulatory Visit: Payer: Self-pay | Admitting: Orthopaedic Surgery

## 2022-03-21 ENCOUNTER — Other Ambulatory Visit (HOSPITAL_COMMUNITY): Payer: Self-pay

## 2022-03-24 ENCOUNTER — Other Ambulatory Visit (HOSPITAL_COMMUNITY): Payer: Self-pay

## 2022-03-24 DIAGNOSIS — G4730 Sleep apnea, unspecified: Secondary | ICD-10-CM | POA: Diagnosis not present

## 2022-03-24 MED ORDER — MONTELUKAST SODIUM 10 MG PO TABS
10.0000 mg | ORAL_TABLET | Freq: Every day | ORAL | 0 refills | Status: DC
  Filled 2022-03-24: qty 90, 90d supply, fill #0

## 2022-03-24 MED ORDER — ROSUVASTATIN CALCIUM 10 MG PO TABS
10.0000 mg | ORAL_TABLET | Freq: Every day | ORAL | 0 refills | Status: DC
  Filled 2022-03-24: qty 90, 90d supply, fill #0

## 2022-03-24 MED ORDER — LOSARTAN POTASSIUM 50 MG PO TABS
50.0000 mg | ORAL_TABLET | Freq: Every day | ORAL | 0 refills | Status: DC
  Filled 2022-03-24: qty 90, 90d supply, fill #0

## 2022-03-25 ENCOUNTER — Other Ambulatory Visit: Payer: Self-pay | Admitting: Orthopaedic Surgery

## 2022-03-25 ENCOUNTER — Other Ambulatory Visit (HOSPITAL_COMMUNITY): Payer: Self-pay

## 2022-03-25 NOTE — Progress Notes (Unsigned)
Chief Complaint:   OBESITY Mackenzie [name] is here to discuss her progress with her obesity treatment plan along with follow-up of her obesity related diagnoses. [name] is on the Category 3 Plan and states she is following her eating plan approximately 65% of the time. Mackenzie [name] states she is not exercising.   Today's visit was #: 57 Starting weight: 267 lbs Starting date: 08/09/2019 Today's weight: 242 lbs Today's date: 03/19/2022 Total lbs lost to date: 25 lbs Total lbs lost since last in-office visit: +1 lb  Interim History: She has been eating out more frequently due to hectic schedule. PA for [facility] authorization 03/17/2022-03/17/2023.  Subjective:   1. Pre-diabetes A1c ***  Assessment/Plan:   1. Pre-diabetes Refill - metFORMIN (GLUCOPHAGE) 500 MG tablet; Take 1 tablet (500 mg total) by mouth 2 (two) times daily with a meal.  Dispense: 180 tablet; Refill: 0  2. Obesity,current BMI 40.4 Refill - Semaglutide-Weight Management 1.7 MG/0.75ML SOAJ; Inject 1.7 mg into the skin once a week.  Dispense: 3 mL; Refill: 0  Mackenzie [name] is currently in the action stage of change. As such, her goal is to continue with weight loss efforts. She has agreed to the Category 3 Plan.   Exercise goals:  As is.   Behavioral modification strategies: increasing lean protein intake, decreasing simple carbohydrates, meal planning and cooking strategies, keeping healthy foods in the home, and keeping a strict food journal.  Carmeline has agreed to follow-up with our clinic in 4 weeks. She was informed of the importance of frequent follow-up visits to maximize her success with intensive lifestyle modifications for her multiple health conditions.   Objective:   Blood pressure 120/82, pulse 87, temperature 98.4 F (36.9 C), height '5\' 5"'$  (1.651 m), weight 242 lb (109.8 kg), last menstrual period 11/28/2016, SpO2 99 %, unknown if currently breastfeeding. Body mass index is 40.27 kg/m.  General: Cooperative,  alert, well developed, in no acute distress. HEENT: Conjunctivae and lids unremarkable. Cardiovascular: Regular rhythm.  Lungs: Normal work of breathing. Neurologic: No focal deficits.   Lab Results  Component Value Date   CREATININE 0.88 01/29/2022   BUN 16 01/29/2022   NA 137 01/29/2022   K 4.5 01/29/2022   CL 101 01/29/2022   CO2 23 01/29/2022   Lab Results  Component Value Date   ALT 15 01/29/2022   AST 16 01/29/2022   GGT 19 03/01/2021   ALKPHOS 177 (H) 01/29/2022   BILITOT 0.3 01/29/2022   Lab Results  Component Value Date   HGBA1C 5.4 01/29/2022   HGBA1C 5.4 12/19/2021   HGBA1C 5.4 05/23/2021   HGBA1C 5.4 02/27/2021   HGBA1C 5.4 11/15/2020   Lab Results  Component Value Date   INSULIN 21.3 01/29/2022   INSULIN 18.2 01/07/2022   INSULIN 15.1 05/23/2021   INSULIN 15.1 02/27/2021   INSULIN 11.7 11/15/2020   Lab Results  Component Value Date   TSH 2.65 02/27/2022   Lab Results  Component Value Date   CHOL 202 (H) 11/15/2020   HDL 58 11/15/2020   LDLCALC 135 (H) 11/15/2020   TRIG 52 11/15/2020   CHOLHDL 3.5 11/15/2020   Lab Results  Component Value Date   VD25OH 50.2 01/29/2022   VD25OH 49.2 12/19/2021   VD25OH 102.0 (H) 05/23/2021   Lab Results  Component Value Date   WBC 3.8 (L) 07/23/2021   HGB 11.5 (L) 07/23/2021   HCT 35.9 (L) 07/23/2021   MCV 79.6 07/23/2021   PLT 201.0 07/23/2021   No results  found for: "IRON", "TIBC", "FERRITIN"  Attestation Statements:   Reviewed by clinician on day of visit: allergies, medications, problem list, medical history, surgical history, family history, social history, and previous encounter notes.  I, Davy Pique, RMA, am acting as Location manager for [name], NP.  I have reviewed the above documentation for accuracy and completeness, and I agree with the above. -  ***

## 2022-03-25 NOTE — Progress Notes (Signed)
[facility] PREP Evaluation  Patient Details  Name: Mackenzie [name] MRN: [id number] Date of Birth: 07-21-1970 Age: 51 y.o. PCP: [name], MD  Vitals:   03/24/22 1900  BP: 130/72  Pulse: 87  SpO2: 97%  Weight: 249 lb 12.8 oz (113.3 kg)     [facility] Eval - 03/25/22 1600       [facility] "PREP" Location   [facility] "PREP" Location [facility]      Referral    Referring Provider [place]    Reason for referral Hypertension;Inactivity;High Cholesterol   PREdiabetes hx of cancer   Program Start Date 03/25/22   T/TH 6p-730p     Measurement   Waist Circumference 48.5 inches    Hip Circumference 54 inches    Body fat 47.7 percent      Information for Trainer   Goals Goal weight 170, 24 lbs during program    Current Exercise none    Orthopedic Concerns knees sore bilaterally    Pertinent Medical History HTN, Hx of cancer, hypothyroid, inc lipids and chol, prediabetic    Current Barriers none    Restrictions/Precautions --   none   Medications that affect exercise Beta blocker;Medication causing dizziness/drowsiness      Timed Up and Go (TUGS)   Timed Up and Go Low risk <9 seconds      Mobility and Daily Activities   I find it easy to walk up or down two or more flights of stairs. 1    I have no trouble taking out the trash. 4    I do housework such as vacuuming and dusting on my own without difficulty. 4    I can easily lift a gallon of milk (8lbs). 4    I can easily walk a mile. 4    I have no trouble reaching into high cupboards or reaching down to pick up something from the floor. 2    I do not have trouble doing out-door work such as Armed forces logistics/support/administrative officer, raking leaves, or gardening. 4      Mobility and Daily Activities   I feel younger than my age. 2    I feel independent. 4    I feel energetic. 2    I live an active life.  4    I feel strong. 2    I feel healthy. 2    I feel active as other people my age. 2      How fit and strong are you.   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, left ([place]) 01/23/2017   Edema, lower extremity    Hot flashes 03/2017   Hyperlipidemia 03/20/2016   Morbid obesity with body mass index (BMI) of 40.0 to 49.9 ([place]) 09/17/2017   NCGS (non-celiac gluten sensitivity)    Pre-diabetes 07/26/2018   Hgb A1C elevated on 07/26/2018, Gestational Diabetes 2012   Seasonal allergies 2012   seasonal allergies causes allergic rhinitis and itchy, dry eyes per pt   Vitamin D deficiency 05/2015   Past Surgical History:  Procedure Laterality Date   BREAST RECONSTRUCTION WITH PLACEMENT OF TISSUE EXPANDER AND FLEX  HD (ACELLULAR HYDRATED DERMIS) Left 08/27/2017   Procedure: LEFT BREAST RECONSTRUCTION WITH PLACEMENT OF TISSUE EXPANDER AND FLEX HD;  Surgeon: Wallace Going, DO;  Location: [place];  Service: Plastics;  Laterality: Left;   CESAREAN SECTION     x2   LATISSIMUS FLAP TO BREAST Left 11/03/2018   LATISSIMUS FLAP TO BREAST Left 11/03/2018   Procedure: LATISSIMUS FLAP TO LEFT BREAST;  Surgeon: Wallace Going, DO;  Location: [place];  Service: Plastics;  Laterality: Left;   MASTECTOMY MODIFIED RADICAL Left 08/27/2017   MASTECTOMY MODIFIED RADICAL Left 08/27/2017   Procedure: LEFT MODIFIED RADICAL MASTECTOMY;  Surgeon: Erroll Luna, MD;  Location: [facility];  Service: General;  Laterality: Left;   MASTOPEXY Right 03/03/2019   Procedure: RIGHT BREAST MASTOPEXY/REDUCTION;  Surgeon: Wallace Going, DO;  Location: [facility];  Service: Plastics;  Laterality: Right;  3 hours, please   PORT-A-CATH REMOVAL Right 08/27/2017   Procedure: REMOVAL PORT-A-CATH RIGHT CHEST;  Surgeon: Erroll Luna, MD;  Location: [place] [facility];  Service: General;   Laterality: Right;   PORTACATH PLACEMENT Right 01/28/2017   Procedure: INSERTION PORT-A-CATH WITH ULTRASOUND;  Surgeon: Erroll Luna, MD;  Location: [facility];  Service: General;  Laterality: Right;   REDUCTION MAMMAPLASTY Right    REMOVAL OF TISSUE EXPANDER AND PLACEMENT OF IMPLANT Left 03/03/2019   Procedure: REMOVAL OF TISSUE EXPANDER AND PLACEMENT OF EXPANDER;  Surgeon: Wallace Going, DO;  Location: [facility];  Service: Plastics;  Laterality: Left;   REMOVAL OF TISSUE EXPANDER AND PLACEMENT OF IMPLANT Left 05/25/2019   Procedure: LEFT BREAST REMOVAL OF TISSUE EXPANDER AND PLACEMENT OF IMPLANT;  Surgeon: Wallace Going, DO;  Location: [facility];  Service: Plastics;  Laterality: Left;   TISSUE EXPANDER PLACEMENT Left 11/03/2018   Procedure: PLACEMENT OF TISSUE EXPANDER LEFT BREAST;  Surgeon: Wallace Going, DO;  Location: [facility];  Service: Plastics;  Laterality: Left;  Total case time is 3.5 hours   TUBAL LIGATION Bilateral 01/21/2011   Social History   Tobacco Use  Smoking Status Never  Smokeless Tobacco Never    Barnett Hatter 03/25/2022, 4:39 PM

## 2022-03-28 ENCOUNTER — Other Ambulatory Visit (HOSPITAL_COMMUNITY): Payer: Self-pay

## 2022-03-28 ENCOUNTER — Other Ambulatory Visit: Payer: Self-pay | Admitting: Orthopaedic Surgery

## 2022-04-04 ENCOUNTER — Other Ambulatory Visit: Payer: Self-pay

## 2022-04-08 ENCOUNTER — Other Ambulatory Visit (HOSPITAL_COMMUNITY): Payer: Self-pay

## 2022-04-08 NOTE — Progress Notes (Signed)
[facility] PREP Weekly Session  Patient Details  Name: Mackenzie [name] MRN: [id number] Date of Birth: 06-Jan-1971 Age: 51 y.o. PCP: [name], MD  Vitals:   04/01/22 1800  Weight: 247 lb (112 kg)     [facility] Weekly seesion - 04/08/22 1600       [facility] "PREP" Location   [facility]PREP[facility] Location [facility]      Weekly Session   Topic Discussed Importance of resistance training;Other ways to be active    Minutes exercised this week 161 minutes    Classes attended to date [place] 04/08/2022, 4:56 PM

## 2022-04-09 ENCOUNTER — Ambulatory Visit (INDEPENDENT_AMBULATORY_CARE_PROVIDER_SITE_OTHER): Payer: 59 | Admitting: Adult Health

## 2022-04-09 ENCOUNTER — Other Ambulatory Visit (HOSPITAL_COMMUNITY): Payer: Self-pay

## 2022-04-09 ENCOUNTER — Encounter (INDEPENDENT_AMBULATORY_CARE_PROVIDER_SITE_OTHER): Payer: Self-pay | Admitting: Adult Health

## 2022-04-09 VITALS — BP 118/78 | HR 80 | Temp 97.7°F | Ht 65.0 in | Wt 244.0 lb

## 2022-04-09 DIAGNOSIS — E669 Obesity, unspecified: Secondary | ICD-10-CM

## 2022-04-09 DIAGNOSIS — R7303 Prediabetes: Secondary | ICD-10-CM

## 2022-04-09 DIAGNOSIS — Z6841 Body Mass Index (BMI) 40.0 and over, adult: Secondary | ICD-10-CM | POA: Diagnosis not present

## 2022-04-09 MED ORDER — SEMAGLUTIDE-WEIGHT MANAGEMENT 1.7 MG/0.75ML ~~LOC~~ SOAJ
1.7000 mg | SUBCUTANEOUS | 0 refills | Status: DC
  Filled 2022-04-09 – 2022-04-18 (×2): qty 3, 28d supply, fill #0

## 2022-04-09 NOTE — Progress Notes (Signed)
[facility] PREP Weekly Session  Patient Details  Name: Mackenzie [name] MRN: [id number] Date of Birth: 06/24/1970 Age: 51 y.o. PCP: [name], MD  Vitals:   04/08/22 1323  Weight: 250 lb 6.4 oz (113.6 kg)     [facility] Weekly seesion - 04/09/22 1300       [facility] "PREP" Location   [facility]PREP" [facility]      Weekly Session   Topic Discussed Healthy eating tips    Minutes exercised this week 130 minutes    Classes attended to date [place] 04/09/2022, 1:24 PM

## 2022-04-10 ENCOUNTER — Other Ambulatory Visit (HOSPITAL_COMMUNITY): Payer: Self-pay

## 2022-04-14 NOTE — Progress Notes (Signed)
Chief Complaint:   OBESITY Mackenzie [name] is here to discuss her progress with her obesity treatment plan along with follow-up of her obesity related diagnoses. Mackenzie [name] is on the Category 3 Plan and states she is following her eating plan approximately 60-70% of the time. Mackenzie [name] states she is walking 40 minutes 3 times per week.  Today's visit was #: 48 Starting weight: 267 lbs Starting date: 08/09/2019 Today's weight: 244 lbs Today's date: 04/09/2022 Total lbs lost to date: 23 lbs Total lbs lost since last in-office visit: +2 lbs  Interim History:  [name] endorsed decreased water intake the last several weeks.   She estimates to drink approximately 60 oz of water per day.  She started PREP class at the [facility].  She has classes 2 times per week.    Subjective:   1. Pre-diabetes Lab Results  Component Value Date   HGBA1C 5.4 01/29/2022   HGBA1C 5.4 12/19/2021   HGBA1C 5.4 05/23/2021    She denies 1st degree family history of T2DM.  She is currently on weekly Wegovy. She denies mass in neck, dysphagia, dyspepsia, persistent hoarseness, abdominal pain, or N/V/Constipation.  Assessment/Plan:   1. Pre-diabetes Continue Category 3 meal plan and [facility] PREP plan.   2. Obesity,current BMI 40.7 Refill - Semaglutide-Weight Management 1.7 MG/0.75ML SOAJ; Inject 1.7 mg into the skin once a week.  Dispense: 3 mL; Refill: 0  Mackenzie [name] is currently in the action stage of change. As such, her goal is to continue with weight loss efforts. She has agreed to the Category 3 Plan.   Exercise goals:  As is.   Behavioral modification strategies: increasing lean protein intake, decreasing simple carbohydrates, increasing water intake, meal planning and cooking strategies, keeping healthy foods in the home, and planning for success.  Mackenzie [name] has agreed to follow-up with our clinic in 4 weeks. She was informed of the importance of frequent follow-up visits to maximize her success with  intensive lifestyle modifications for her multiple health conditions.   Objective:   Blood pressure 118/78, pulse 80, temperature 97.7 F (36.5 C), height '5\' 5"'$  (1.651 m), weight 244 lb (110.7 kg), last menstrual period 11/28/2016, SpO2 97 %, unknown if currently breastfeeding. Body mass index is 40.6 kg/m.  General: Cooperative, alert, well developed, in no acute distress. HEENT: Conjunctivae and lids unremarkable. Cardiovascular: Regular rhythm.  Lungs: Normal work of breathing. Neurologic: No focal deficits.   Lab Results  Component Value Date   CREATININE 0.88 01/29/2022   BUN 16 01/29/2022   NA 137 01/29/2022   K 4.5 01/29/2022   CL 101 01/29/2022   CO2 23 01/29/2022   Lab Results  Component Value Date   ALT 15 01/29/2022   AST 16 01/29/2022   GGT 19 03/01/2021   ALKPHOS 177 (H) 01/29/2022   BILITOT 0.3 01/29/2022   Lab Results  Component Value Date   HGBA1C 5.4 01/29/2022   HGBA1C 5.4 12/19/2021   HGBA1C 5.4 05/23/2021   HGBA1C 5.4 02/27/2021   HGBA1C 5.4 11/15/2020   Lab Results  Component Value Date   INSULIN 21.3 01/29/2022   INSULIN 18.2 01/07/2022   INSULIN 15.1 05/23/2021   INSULIN 15.1 02/27/2021   INSULIN 11.7 11/15/2020   Lab Results  Component Value Date   TSH 2.65 02/27/2022   Lab Results  Component Value Date   CHOL 202 (H) 11/15/2020   HDL 58 11/15/2020   LDLCALC 135 (H) 11/15/2020   TRIG 52 11/15/2020   CHOLHDL 3.5 11/15/2020  Lab Results  Component Value Date   VD25OH 50.2 01/29/2022   VD25OH 49.2 12/19/2021   VD25OH 102.0 (H) 05/23/2021   Lab Results  Component Value Date   WBC 3.8 (L) 07/23/2021   HGB 11.5 (L) 07/23/2021   HCT 35.9 (L) 07/23/2021   MCV 79.6 07/23/2021   PLT 201.0 07/23/2021   No results found for: "IRON", "TIBC", "FERRITIN"  Attestation Statements:   Reviewed by clinician on day of visit: allergies, medications, problem list, medical history, surgical history, family history, social history, and  previous encounter notes.  I, Davy Pique, RMA, am acting as Location manager for [name], NP.  I have reviewed the above documentation for accuracy and completeness, and I agree with the above. -  Sameera Betton d. Riot Barrick, NP-C

## 2022-04-18 ENCOUNTER — Other Ambulatory Visit (HOSPITAL_COMMUNITY): Payer: Self-pay

## 2022-04-21 ENCOUNTER — Telehealth: Payer: Self-pay

## 2022-04-21 ENCOUNTER — Telehealth: Payer: Self-pay | Admitting: *Deleted

## 2022-04-21 NOTE — Telephone Encounter (Signed)
Patient will have labs drawn when she goes to cancer center.

## 2022-04-21 NOTE — Telephone Encounter (Signed)
[name]: Informed patient of lab only appointment scheduled for Monday 04/28/22 to check her hormone levels. No need to fast.  Study visit is also due at same time.  Research nurse will be out of office on Monday but will call patient on Friday 11/17 to complete the visit. Also gave patient option of changing lab appointment to Friday 11/17 if that works for her schedule.  Patient agreed Friday at 8:30 am for lab and quick research visit would work.  Scheduling request sent to reschedule lab appointment to this Friday 11/17.  Foye Spurling, BSN, RN, [place] Nurse II [phone] 04/21/2022 11:34 AM

## 2022-04-21 NOTE — Telephone Encounter (Signed)
Patient wants to  make sure that she still needs to have labs drawn this Thursday.

## 2022-04-23 ENCOUNTER — Other Ambulatory Visit: Payer: Self-pay | Admitting: *Deleted

## 2022-04-23 DIAGNOSIS — Z170 Estrogen receptor positive status [ER+]: Secondary | ICD-10-CM

## 2022-04-23 NOTE — Progress Notes (Signed)
[facility] PREP Weekly Session  Patient Details  Name: Mackenzie [name] MRN: [id number] Date of Birth: 05/01/1971 Age: 51 y.o. PCP: [name], MD  Vitals:   04/15/22 1800  Weight: 248 lb 9.6 oz (112.8 kg)     [facility] Weekly seesion - 04/23/22 1500       [facility] "PREP" Location   [facility] "PREP" Location [facility]      Weekly Session   Topic Discussed Health habits    Minutes exercised this week 149 minutes    Classes attended to date 6           Late entry  Barnett Hatter 04/23/2022, 3:52 PM

## 2022-04-24 ENCOUNTER — Other Ambulatory Visit: Payer: 59

## 2022-04-24 ENCOUNTER — Other Ambulatory Visit (HOSPITAL_COMMUNITY): Payer: Self-pay

## 2022-04-24 MED ORDER — METOPROLOL SUCCINATE ER 50 MG PO TB24
50.0000 mg | ORAL_TABLET | Freq: Every day | ORAL | 0 refills | Status: DC
  Filled 2022-04-24: qty 90, 90d supply, fill #0

## 2022-04-24 MED ORDER — SPIRONOLACTONE 25 MG PO TABS
25.0000 mg | ORAL_TABLET | Freq: Every day | ORAL | 0 refills | Status: DC
  Filled 2022-04-24: qty 90, 90d supply, fill #0

## 2022-04-24 NOTE — Progress Notes (Signed)
[facility] PREP Weekly Session  Patient Details  Name: Mackenzie [name] MRN: [id number] Date of Birth: [birth year] Age: 51 y.o. PCP: [name], MD  Vitals:   04/22/22 1131  Weight: 247 lb 6.4 oz (112.2 kg)     [facility] Weekly seesion - 04/24/22 1100       [facility] "PREP" Location   [facility]PREP" [facility]      Weekly Session   Topic Discussed Restaurant Eating   Salt demo   Minutes exercised this week 99 minutes    Classes attended to date [place] 04/24/2022, 11:32 AM

## 2022-04-25 ENCOUNTER — Inpatient Hospital Stay: Payer: 59 | Attending: Adult Health

## 2022-04-25 ENCOUNTER — Encounter: Payer: Self-pay | Admitting: *Deleted

## 2022-04-25 ENCOUNTER — Other Ambulatory Visit: Payer: Self-pay | Admitting: Internal Medicine

## 2022-04-25 ENCOUNTER — Other Ambulatory Visit: Payer: Self-pay

## 2022-04-25 DIAGNOSIS — Z79811 Long term (current) use of aromatase inhibitors: Secondary | ICD-10-CM | POA: Diagnosis not present

## 2022-04-25 DIAGNOSIS — Z170 Estrogen receptor positive status [ER+]: Secondary | ICD-10-CM | POA: Diagnosis not present

## 2022-04-25 DIAGNOSIS — E0590 Thyrotoxicosis, unspecified without thyrotoxic crisis or storm: Secondary | ICD-10-CM

## 2022-04-25 DIAGNOSIS — C50212 Malignant neoplasm of upper-inner quadrant of left female breast: Secondary | ICD-10-CM | POA: Diagnosis not present

## 2022-04-25 LAB — T4, FREE: Free T4: 0.97 ng/dL (ref 0.61–1.12)

## 2022-04-25 LAB — TSH: TSH: 1.813 u[IU]/mL (ref 0.350–4.500)

## 2022-04-25 NOTE — Research (Addendum)
Mackenzie [name] - Visit 22  [name] arrived today for her scheduled lab appointment and to complete Visit 22 on the Mackenzie [name] protocol.  Labs: Blood drawn to monitor hormone levels for continued assessment of holding Goserelin injections.  Patient also requested thyroid labs be drawn today as ordered by her Endocrinologist. Dr. Chryl Heck agreed to order the [facility] and free T4 to be drawn in our lab today to save patient from having another lab appointment this week.   Healthcare Resource Utilization: Patient did not have any ED visits or hospitalizations since last study contact. She did not have any non protocol required health care appointments related to her breast cancer other than [name] of breast which was ordered by Dr. Chryl Heck on her last visit.    Serious Adverse Events:  No SAEs since last study contact.    Endocrine Treatment: Patient states she continues to take Letrozole daily as prescribed and is recording doses for her medication diary.  Plan:  Patient understands that depending on results of hormone levels, she may need to restart Goserelin injections. We will continue to hold until all results are available and research nurse can confirm with Dr. Chryl Heck if okay to continue to hold. Patient denies any menstrual bleeding and understands to notify Dr. Chryl Heck and/or research nurse if she starts to have any vaginal bleeding before next appointment.   Visit Scheduling - Patient is scheduled to return to clinic in 3 months on 07/21/22 for visit 23.  She will have lab appointment to continue to monitor her hormone levels and see Dr. Chryl Heck.   Patient aware that she should contact the clinic at any time if she has any questions or concerns prior to her next visit.  Patient thanked for her ongoing participation in the [place] trial.  Foye Spurling, BSN, RN, [employer] Nurse II [phone] 04/25/2022

## 2022-04-26 LAB — FOLLICLE STIMULATING HORMONE: FSH: 24.2 m[IU]/mL

## 2022-04-26 LAB — LUTEINIZING HORMONE: LH: 11.5 m[IU]/mL

## 2022-04-28 ENCOUNTER — Other Ambulatory Visit: Payer: 59

## 2022-04-30 NOTE — Progress Notes (Signed)
[facility] PREP Weekly Session  Patient Details  Name: Mackenzie [name] MRN: [id number] Date of Birth: [birth year] Age: 51 y.o. PCP: [name], MD  Vitals:   04/29/22 1800  Weight: 245 lb 9.6 oz (111.4 kg)     [facility] Weekly seesion - 04/30/22 0800       [facility] "PREP" Location   [facility]PREP" [facility]      Weekly Session   Topic Discussed Eating for the season;Stress management and problem solving   reset goals   Minutes exercised this week 206 minutes    Classes attended to date [place] 04/30/2022, 8:52 AM

## 2022-05-01 LAB — ESTRADIOL, ULTRA SENS: Estradiol, Sensitive: <2.5 pg/mL

## 2022-05-05 ENCOUNTER — Encounter: Payer: Self-pay | Admitting: Podiatry

## 2022-05-05 ENCOUNTER — Ambulatory Visit (INDEPENDENT_AMBULATORY_CARE_PROVIDER_SITE_OTHER): Payer: 59 | Admitting: Podiatry

## 2022-05-05 DIAGNOSIS — M779 Enthesopathy, unspecified: Secondary | ICD-10-CM | POA: Diagnosis not present

## 2022-05-05 NOTE — Progress Notes (Signed)
Subjective:   Patient ID: Mackenzie [name], female   DOB: 51 y.o.   MRN: [id number]   HPI Patient presents stating she is starting to develop foot pain again and her orthotics are 26-1/53 years old and starting to lose some of their support   ROS      Objective:  Physical Exam  Ocular status intact muscle strength adequate patient's feet do have flattening of the arch moderate discomfort into the midfoot and plantar into the arch region left over right     Assessment:  Moderate tendinitis-like symptomatology with moderate flatfoot structure     Plan:  H&P reviewed condition educated her on this discussed shoe gear modifications good support and we will get her a new pair of orthotics to provide for better long-term support and also she could have this pair recovered which would provide more long-term benefit.  Patient to be seen back when orthotics return with casting done today

## 2022-05-06 ENCOUNTER — Encounter: Payer: Self-pay | Admitting: Oncology

## 2022-05-06 ENCOUNTER — Other Ambulatory Visit: Payer: 59

## 2022-05-06 NOTE — Telephone Encounter (Signed)
Informed patient of hormone levels are okay to continue to hold goserelin per Dr. Chryl Heck. We will recheck levels at her next appointment on 07/21/22.  Also informed her that the thyroid results (drawn for Dr. Kelton Pillar) were wnl and they were not routed to Dr. Kelton Pillar but she can see them if she looks in patient's chart.  Asked patient to call back if any questions or concerns.  Foye Spurling, BSN, RN, [place] Nurse II [phone] 05/06/2022 10:24 AM

## 2022-05-08 ENCOUNTER — Telehealth: Payer: Self-pay | Admitting: *Deleted

## 2022-05-08 ENCOUNTER — Other Ambulatory Visit: Payer: Self-pay

## 2022-05-08 ENCOUNTER — Other Ambulatory Visit: Payer: Self-pay | Admitting: *Deleted

## 2022-05-08 ENCOUNTER — Other Ambulatory Visit (HOSPITAL_COMMUNITY): Payer: Self-pay

## 2022-05-08 DIAGNOSIS — Z170 Estrogen receptor positive status [ER+]: Secondary | ICD-10-CM

## 2022-05-08 MED ORDER — LETROZOLE 2.5 MG PO TABS
2.5000 mg | ORAL_TABLET | Freq: Every day | ORAL | 4 refills | Status: DC
  Filled 2022-05-08: qty 90, 90d supply, fill #0
  Filled 2022-08-04: qty 90, 90d supply, fill #1
  Filled 2022-11-10: qty 90, 90d supply, fill #2
  Filled 2023-02-11: qty 90, 90d supply, fill #3

## 2022-05-08 NOTE — Telephone Encounter (Signed)
Patient requests refill on Letrozole from Dr. Chryl Heck.  She has one pill left. Informed patient I would send message to Dr. Chryl Heck and her nurse.  Foye Spurling, BSN, RN, [place] Nurse II [phone] 05/08/2022 4:01 PM

## 2022-05-08 NOTE — Telephone Encounter (Signed)
Letrozole refill requested by patient. Refilled per MD OV note 02/03/22

## 2022-05-08 NOTE — Progress Notes (Signed)
[facility] PREP Weekly Session  Patient Details  Name: Mackenzie [name] MRN: [id number] Date of Birth: 29-Jan-1971 Age: 51 y.o. PCP: [name], MD  Vitals:   05/06/22 1800  Weight: 249 lb 9.6 oz (113.2 kg)     [facility] Weekly seesion - 05/08/22 1700       [facility] "PREP" Location   [facility]PREP" [facility]      Weekly Session   Topic Discussed Expectations and non-scale victories    Minutes exercised this week 15 minutes    Classes attended to date [name] 05/08/2022, 5:17 PM

## 2022-05-09 ENCOUNTER — Other Ambulatory Visit (HOSPITAL_COMMUNITY): Payer: Self-pay

## 2022-05-14 NOTE — Progress Notes (Signed)
[facility] PREP Weekly Session  Patient Details  Name: Mackenzie [name] MRN: [id number] Date of Birth: 04/13/1971 Age: 51 y.o. PCP: [name], MD  Vitals:   05/13/22 1800  Weight: 249 lb 9.6 oz (113.2 kg)     [facility] Weekly seesion - 05/14/22 1200       [facility] "PREP" Location   [facility] "PREP" Location [facility]      Weekly Session   Topic Discussed --   Portions   Minutes exercised this week 195 minutes    Classes attended to date 12             Barnett Hatter 05/14/2022, 12:03 PM

## 2022-05-15 ENCOUNTER — Ambulatory Visit (INDEPENDENT_AMBULATORY_CARE_PROVIDER_SITE_OTHER): Payer: 59 | Admitting: Adult Health

## 2022-05-15 ENCOUNTER — Ambulatory Visit (INDEPENDENT_AMBULATORY_CARE_PROVIDER_SITE_OTHER): Payer: 59 | Admitting: Family Medicine

## 2022-05-21 ENCOUNTER — Ambulatory Visit (INDEPENDENT_AMBULATORY_CARE_PROVIDER_SITE_OTHER): Payer: 59 | Admitting: Physician Assistant

## 2022-05-21 NOTE — Progress Notes (Signed)
[facility] PREP Weekly Session  Patient Details  Name: Mackenzie [name] MRN: [id number] Date of Birth: 1970-08-10 Age: 51 y.o. PCP: [name], MD  Vitals:   05/20/22 1800  Weight: 244 lb 12.8 oz (111 kg)     [facility] Weekly seesion - 05/21/22 0900       [facility] "PREP" Location   [facility] "PREP" Location [facility]      Weekly Session   Topic Discussed Finding support    Minutes exercised this week 86 minutes    Classes attended to date 13             [place] 05/21/2022, 9:05 AM

## 2022-05-22 ENCOUNTER — Encounter: Payer: Self-pay | Admitting: Student

## 2022-05-22 ENCOUNTER — Other Ambulatory Visit (HOSPITAL_COMMUNITY): Payer: Self-pay

## 2022-05-22 ENCOUNTER — Ambulatory Visit (INDEPENDENT_AMBULATORY_CARE_PROVIDER_SITE_OTHER): Payer: 59 | Admitting: Student

## 2022-05-22 ENCOUNTER — Encounter: Payer: Self-pay | Admitting: Oncology

## 2022-05-22 ENCOUNTER — Encounter (INDEPENDENT_AMBULATORY_CARE_PROVIDER_SITE_OTHER): Payer: Self-pay | Admitting: Physician Assistant

## 2022-05-22 ENCOUNTER — Ambulatory Visit (INDEPENDENT_AMBULATORY_CARE_PROVIDER_SITE_OTHER): Payer: 59 | Admitting: Physician Assistant

## 2022-05-22 VITALS — BP 125/80 | HR 88

## 2022-05-22 VITALS — BP 103/70 | HR 86 | Temp 97.9°F | Ht 65.0 in | Wt 239.0 lb

## 2022-05-22 DIAGNOSIS — E669 Obesity, unspecified: Secondary | ICD-10-CM

## 2022-05-22 DIAGNOSIS — Z923 Personal history of irradiation: Secondary | ICD-10-CM | POA: Diagnosis not present

## 2022-05-22 DIAGNOSIS — Z9882 Breast implant status: Secondary | ICD-10-CM | POA: Diagnosis not present

## 2022-05-22 DIAGNOSIS — M549 Dorsalgia, unspecified: Secondary | ICD-10-CM

## 2022-05-22 DIAGNOSIS — Z853 Personal history of malignant neoplasm of breast: Secondary | ICD-10-CM

## 2022-05-22 DIAGNOSIS — Z6839 Body mass index (BMI) 39.0-39.9, adult: Secondary | ICD-10-CM | POA: Diagnosis not present

## 2022-05-22 DIAGNOSIS — R7303 Prediabetes: Secondary | ICD-10-CM

## 2022-05-22 DIAGNOSIS — Z9889 Other specified postprocedural states: Secondary | ICD-10-CM

## 2022-05-22 DIAGNOSIS — E66813 Obesity, class 3: Secondary | ICD-10-CM

## 2022-05-22 MED ORDER — SEMAGLUTIDE-WEIGHT MANAGEMENT 1.7 MG/0.75ML ~~LOC~~ SOAJ
1.7000 mg | SUBCUTANEOUS | 0 refills | Status: DC
  Filled 2022-05-22: qty 3, 28d supply, fill #0

## 2022-05-22 NOTE — Progress Notes (Signed)
Referring Provider [name], MD [place],  [place] [address]   CC: No chief complaint on file.     Mackenzie [name] is an 51 y.o. female.  HPI: Patient is a 51 year old female with history of left breast cancer found in 2018.  She underwent chemotherapy followed by mastectomy and radiation.  Patient subsequently underwent a latissimus muscle flap May 2020 with expander placement followed by an implant placement.  The implant was placed in December 2020 and it was a Product manager ultrahigh profile 750 cc gel implant.  Patient was most recently seen in the clinic on 02/21/2022.  At this visit, she had concerns about left back pain.  She was found to have some tenderness in the area and possibly some irritation to the muscle.  Patient reported at this visit she felt a sharp pain with certain movements and it would only last for few minutes.  She denied any issues with her breast.  Physical therapy was ordered for the patient to see if that may help decrease the sensitivity of the left back area.  Today, patient reports she is doing okay.  She states that she never did the physical therapy that was ordered for her as she had difficulty setting it up.  She states that she is still experiencing pain to her left back.  She states that it is a sharp pain that happens intermittently, and the last for a few minutes.  She states that she has not tried taking any medications.  She states that she has been stretching a little bit, but has not done much else in terms of trying to alleviate the pain.  She denies any fevers or chills.  She denies any nausea or vomiting.  She reports that sometimes she has some tingling/nerve type pain to her left breast that is fleeting and intermittent.  She denies any other issues with her left breast. Patient also inquiring about possible MRI.   Review of Systems General: Denies any fevers or chills  Physical Exam    05/20/2022    6:00 PM  05/13/2022    6:00 PM 05/06/2022    6:00 PM  Vitals with BMI  Weight 244 lbs 13 oz 249 lbs 10 oz 249 lbs 10 oz    General:  No acute distress,  Alert and oriented, Non-Toxic, Normal speech and affect Chaperone present on exam.  On exam, patient sitting upright in no acute distress.  Left breast implant is in place.  It is soft.  There are some overlying skin changes to the left breast from radiation.  Incisions to the left breast healed well.  There are no fluid collections or signs of infection on exam.  To the back, the incision is intact.  There is some very mild tenderness to palpation near the left posterior/axillary region.  There is no overlying erythema.  There are no signs of infection on exam.  Assessment/Plan  S/P breast reconstruction - Plan: Ambulatory referral to Physical Therapy  I discussed with the patient that her pain could be related to either muscle spasms or may be some tightening due to the reconstruction.  I discussed with the patient that she should try to get physical therapy set up again to see if that this would help her symptoms.  Patient was in agreement with this plan.  I instructed the patient to call us if she has any issues getting physical therapy set up.  I offered the patient  muscle relaxants for the pain that she is experiencing.  Patient declined at this time.  I discussed with the patient that she can follow-up with her oncologist to see if they want to order an MRI, but from our standpoint we will plan to do the physical therapy first to see if this helps her symptoms and have her follow-up and plan next steps from there.  Patient expressed understanding and was in agreement the plan.  I would like the patient to follow-up in 1 month after she has started physical therapy.  I discussed with the patient that if she has not started physical therapy yet, she can push her follow-up out so we can evaluate if physical therapy is helping or not.  Patient expressed  understanding.  I instructed the patient to call in the meantime if she has any questions or concerns.  Objective findings and plan were discussed with Dr. Staci Acosta 05/22/2022, 8:17 AM

## 2022-05-24 ENCOUNTER — Encounter: Payer: Self-pay | Admitting: Oncology

## 2022-06-05 NOTE — Progress Notes (Signed)
Chief Complaint:   OBESITY Mackenzie [name] is here to discuss her progress with her obesity treatment plan along with follow-up of her obesity related diagnoses. Mackenzie [name] is on the Category 3 Plan and states she is following her eating plan approximately 75% of the time. Mackenzie [name] states she is walking and strength training 30 minutes 3-4 times per week.  Today's visit was #: 22 Starting weight: 267 lbs Starting date: 08/09/2019 Today's weight: 239 lbs Today's date: 05/22/2022 Total lbs lost to date: 28 lbs Total lbs lost since last in-office visit: 5  Interim History: Decline started a new program--[employer] probiotics program for the past month.  Has done well with weight loss.  Has travel planned to see son/family over Christmas.  Travel and holiday strategies discussed. Continues PREP program through the [facility] and doing well with this.  Taking Wegovy 1.7 mg weekly without side effects.  Discussed some more options for recipes and advised to try [email or web address] site for more ideas/options.    Subjective:   1. Pre-diabetes [place]'s A1c at 5.4 on 01/29/2022-at goal.  Insulin at 21.3-not at goal.  Taking Metformin 500 mg twice a day denies side effects.  Working to decrease simple carbs, increase protein in diet/exercise regularly to promote weight loss.  Assessment/Plan:   1. Pre-diabetes Continue Metformin, healthy eating plan to decrease simple carbohydrates, increase protein and exercise to promote weight loss. Continue GLP-1 [facility]).   2. Obesity,current BMI 39.8 We will refill Wegovy 1.7 mg for 1 month wit 0 refills.  -Refill Semaglutide-Weight Management 1.7 MG/0.75ML SOAJ; Inject 1.7 mg into the skin once a week.  Dispense: 3 mL; Refill: 0  Mackenzie [name] is currently in the action stage of change. As such, her goal is to continue with weight loss efforts. She has agreed to the Category 3 Plan and keeping a food journal and adhering to recommended goals of 1500 calories and 100+grams of  protein daily.   Continue PREP program thru [facility] and trying to decrease sodium in diet. Looking for more recipes and discussed [email or web address] for ideas.   Exercise goals: As is.  Behavioral modification strategies: increasing lean protein intake, decreasing simple carbohydrates, decreasing sodium intake, meal planning and cooking strategies, travel eating strategies, and holiday eating strategies .  Mackenzie [name] has agreed to follow-up with our clinic in 4 weeks. She was informed of the importance of frequent follow-up visits to maximize her success with intensive lifestyle modifications for her multiple health conditions.   Objective:   Blood pressure 103/70, pulse 86, temperature 97.9 F (36.6 C), height '5\' 5"'$  (1.651 m), weight 239 lb (108.4 kg), last menstrual period 11/28/2016, SpO2 97 %, unknown if currently breastfeeding. Body mass index is 39.77 kg/m.  General: Cooperative, alert, well developed, in no acute distress. HEENT: Conjunctivae and lids unremarkable. Cardiovascular: Regular rhythm.  Lungs: Normal work of breathing. Neurologic: No focal deficits.   Lab Results  Component Value Date   CREATININE 0.88 01/29/2022   BUN 16 01/29/2022   NA 137 01/29/2022   K 4.5 01/29/2022   CL 101 01/29/2022   CO2 23 01/29/2022   Lab Results  Component Value Date   ALT 15 01/29/2022   AST 16 01/29/2022   GGT 19 03/01/2021   ALKPHOS 177 (H) 01/29/2022   BILITOT 0.3 01/29/2022   Lab Results  Component Value Date   HGBA1C 5.4 01/29/2022   HGBA1C 5.4 12/19/2021   HGBA1C 5.4 05/23/2021   HGBA1C 5.4 02/27/2021   HGBA1C 5.4 11/15/2020   Lab Results  Component Value Date   INSULIN 21.3 01/29/2022   INSULIN 18.2 01/07/2022   INSULIN 15.1 05/23/2021   INSULIN 15.1 02/27/2021   INSULIN 11.7 11/15/2020   Lab Results  Component Value Date   TSH 1.813 04/25/2022   Lab Results  Component Value Date   CHOL 202 (H) 11/15/2020   HDL 58 11/15/2020   LDLCALC 135 (H) 11/15/2020    TRIG 52 11/15/2020   CHOLHDL 3.5 11/15/2020   Lab Results  Component Value Date   VD25OH 50.2 01/29/2022   VD25OH 49.2 12/19/2021   VD25OH 102.0 (H) 05/23/2021   Lab Results  Component Value Date   WBC 3.8 (L) 07/23/2021   HGB 11.5 (L) 07/23/2021   HCT 35.9 (L) 07/23/2021   MCV 79.6 07/23/2021   PLT 201.0 07/23/2021   No results found for: "IRON", "TIBC", "FERRITIN"  Attestation Statements:   Reviewed by clinician on day of visit: allergies, medications, problem list, medical history, surgical history, family history, social history, and previous encounter notes.  I, Brendell Tyus, am acting as transcriptionist for [employer], PA.  I have reviewed the above documentation for accuracy and completeness, and I agree with the above. -  Mackenzie Pogosyan,PA-C

## 2022-06-05 NOTE — Therapy (Signed)
OUTPATIENT PHYSICAL THERAPY ONCOLOGY EVALUATION  Patient Name: Mackenzie [name] MRN: [id number] DOB:1970/12/18, 51 y.o., female Today's Date: 06/06/2022  END OF SESSION:  PT End of Session - 06/06/22 1017     Visit Number 1    Number of Visits 16    Date for PT Re-Evaluation 08/01/22    PT Start Time 0903    PT Stop Time 0950    PT Time Calculation (min) 47 min    Activity Tolerance Patient tolerated treatment well    Behavior During Therapy [facility] for tasks assessed/performed             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   Allergic rhinitis 2012   Anemia 01/27/2017   prior to starting chemotherapy   Breast cancer ([place]) 01/14/2017   Left breast   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, left ([place]) 01/23/2017   Edema, lower extremity    Hot flashes 03/2017   Hyperlipidemia 03/20/2016   Morbid obesity with body mass index (BMI) of 40.0 to 49.9 (HCC) 09/17/2017   NCGS (non-celiac gluten sensitivity)    Pre-diabetes 07/26/2018   Hgb A1C elevated on 07/26/2018, Gestational Diabetes 2012   Seasonal allergies 2012   seasonal allergies causes allergic rhinitis and itchy, dry eyes per pt   Vitamin D deficiency 05/2015   Past Surgical History:  Procedure Laterality Date   BREAST RECONSTRUCTION WITH PLACEMENT OF TISSUE EXPANDER AND FLEX HD (ACELLULAR HYDRATED DERMIS) Left 08/27/2017   Procedure: LEFT BREAST RECONSTRUCTION WITH PLACEMENT OF TISSUE EXPANDER AND FLEX HD;  Surgeon: Wallace Going, DO;  Location: [place];  Service: Plastics;  Laterality: Left;   CESAREAN SECTION     x2   LATISSIMUS FLAP TO BREAST Left 11/03/2018   LATISSIMUS FLAP TO BREAST Left 11/03/2018   Procedure: LATISSIMUS FLAP TO LEFT BREAST;  Surgeon: Wallace Going, DO;  Location: [place];  Service: Plastics;  Laterality: Left;   MASTECTOMY MODIFIED RADICAL Left 08/27/2017   MASTECTOMY MODIFIED RADICAL Left 08/27/2017    Procedure: LEFT MODIFIED RADICAL MASTECTOMY;  Surgeon: Erroll Luna, MD;  Location: [place];  Service: General;  Laterality: Left;   MASTOPEXY Right 03/03/2019   Procedure: RIGHT BREAST MASTOPEXY/REDUCTION;  Surgeon: Wallace Going, DO;  Location: [place];  Service: Plastics;  Laterality: Right;  3 hours, please   PORT-A-CATH REMOVAL Right 08/27/2017   Procedure: REMOVAL PORT-A-CATH RIGHT CHEST;  Surgeon: Erroll Luna, MD;  Location: [place];  Service: General;  Laterality: Right;   PORTACATH PLACEMENT Right 01/28/2017   Procedure: INSERTION PORT-A-CATH WITH ULTRASOUND;  Surgeon: Erroll Luna, MD;  Location: [place];  Service: General;  Laterality: Right;   REDUCTION MAMMAPLASTY Right    REMOVAL OF TISSUE EXPANDER AND PLACEMENT OF IMPLANT Left 03/03/2019   Procedure: REMOVAL OF TISSUE EXPANDER AND PLACEMENT OF EXPANDER;  Surgeon: Wallace Going, DO;  Location: [place];  Service: Plastics;  Laterality: Left;   REMOVAL OF TISSUE EXPANDER AND PLACEMENT OF IMPLANT Left 05/25/2019   Procedure: LEFT BREAST REMOVAL OF TISSUE EXPANDER AND PLACEMENT OF IMPLANT;  Surgeon: Wallace Going, DO;  Location: [place];  Service: Plastics;  Laterality: Left;   TISSUE EXPANDER PLACEMENT Left 11/03/2018   Procedure: PLACEMENT OF TISSUE EXPANDER LEFT BREAST;  Surgeon: Wallace Going, DO;  Location: [place];  Service: Plastics;  Laterality: Left;  Total case time is  3.5 hours   TUBAL LIGATION Bilateral 01/21/2011   Patient Active Problem List   Diagnosis Date Noted   Boil of buttock, Left 12/24/2021   Hyperthyroidism 07/22/2021   Low serum thyroid stimulating hormone (TSH) 06/12/2021   Other fatigue 05/23/2021   Hemoglobin low 05/23/2021   Polyphagia 05/16/2020   Vitamin D deficiency 05/16/2020   At risk for osteoporosis 05/16/2020   Prediabetes 05/01/2020   Elevated blood pressure reading 05/01/2020   At risk for  impaired metabolic function 40/98/1191   Breast asymmetry following reconstructive surgery 09/20/2019   Essential hypertension 08/17/2018   Class 3 severe obesity with serious comorbidity and body mass index (BMI) of 40.0 to 44.9 in adult [facility]) 09/17/2017   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   Malignant neoplasm of upper-inner quadrant of left breast in female, estrogen receptor positive ([place]) 07/27/2017   Healthcare maintenance 03/25/2017   Malignant 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, estrogen receptor positive ([place]) 01/23/2017   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, left ([place]) 01/23/2017    PCP: Horald Pollen, MD  REFERRING PROVIDER: Donnamarie Rossetti PA-C  REFERRING DIAG: Post Mastectomy, Left upper back and UE pain  THERAPY DIAG:  Abnormal posture  Status post left breast reconstruction  Stiffness of left shoulder, not elsewhere classified  ONSET DATE: 11/03/2018 with recent exacerbation  Rationale for Evaluation and Treatment: Rehabilitation  SUBJECTIVE:                                                                                                                                                                                           SUBJECTIVE STATEMENT:  Pain in left upper back that is sharp and comes and goes, almost like a cramp that you need to work out. It is intermittent but rated 8/10 when it occurs. It radiates under the arm and into the left breast area.Last episode was mid December. It gets tender and it may be swollen. It is typical to feel good for a little while and then start back again. She had an [name] in October of the breast which was fine. She notices limitations in her left shoulder ROM. I drop things a lot with my both hands maybe from neuropathy. 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   PERTINENT HISTORY:  Left breast cancer diagnosed 01/10/17 by mammogram and  needle biopsy. Then had neo-adjuvant chemo starting 02/10/17 until 06/26/17; had left mastectomy 08/27/17 with ALND and immediate expander placed. Radiation completed. Expander removed and replaced on May 27, 19 b/c it did not get  expanded enough prior to radiation, also had lat flap 11/03/18. Had expander removed and replaced again on 03/03/19 due to a small leak, pt had reduction on R breast at this time   PAIN:  Are you having pain? Nonot presently NPRS scale: 8/10 when it happens Pain location: left upper back radiating under arm Pain orientation: Left and Upper  PAIN TYPE: sharp Pain description: intermittent and stabbing , sometimes aching Aggravating factors: using arms overhead repetitively,just happens and takes her by surprise Relieving factors: positioning her shoulder  PRECAUTIONS: Other: left UE lymphedema risk  WEIGHT BEARING RESTRICTIONS: No  FALLS:  Has patient fallen in last 6 months? No  LIVING ENVIRONMENT: Lives with: her 53 yr old and 26 yr old daughters Lives in: House/apartment Stairs: No; External: 2 steps; none Has following equipment at home: Crutches and bed side commode  OCCUPATION: [employer], Financial controller  LEISURE: walk, watch movies, spend time with kids.  HAND DOMINANCE: right   PRIOR LEVEL OF FUNCTION: Independent  PATIENT GOALS: Get rid of sharp pain.   OBJECTIVE:  COGNITION: Overall cognitive status: Within functional limits for tasks assessed   PALPATION: Tender left scapular region, lateral trunk, pectorals, very tight UT  OBSERVATIONS / OTHER ASSESSMENTS: no visible swelling however pt with decreased muscle mass noted  left scapular region. Healed incision from lat flap with mild tenderness mid incision. Mild compensation and increased effort noted with left shoulder AROM  SENSATION: Light touch: Appears intact POSTURE: significant forward head, rounded shoulders  UPPER EXTREMITY AROM/PROM:  A/PROM RIGHT   eval   Shoulder  extension 55  Shoulder flexion 140  Shoulder abduction 157  Shoulder internal rotation 70  Shoulder external rotation 95    (Blank rows = not tested)  A/PROM LEFT   eval  Shoulder extension 45  Shoulder flexion 132  Shoulder abduction 135  Shoulder internal rotation 54  Shoulder external rotation 75    (Blank rows = not tested)  CERVICAL AROM: All within functional  limits:    UPPER EXTREMITY STRENGTH:  Left grip 40,52,45; avg 45.6 Right grip 50,47,55 avg 50.66   LYMPHEDEMA ASSESSMENTS:   SURGERY TYPE/DATE: Left Modified Radical Mastectomy 08/27/2017, 11/03/2018 Left Lat Flap with tissue expander, 05/25/2019 Removal of expander and placement of implant  NUMBER OF LYMPH NODES REMOVED: 1/4  CHEMOTHERAPY: yes neoadjuvant  RADIATION:yes  HORMONE TREATMENT: yes, Letrozole  INFECTIONS: NO  LYMPHEDEMA ASSESSMENTS:   LANDMARK RIGHT  eval  10 cm proximal to olecranon process   Olecranon process   10 cm proximal to ulnar styloid process   Just proximal to ulnar styloid process   Across hand at thumb web space   At base of 2nd digit   (Blank rows = not tested)  LANDMARK LEFT  eval  10 cm proximal to olecranon process   Olecranon process   10 cm proximal to ulnar styloid process   Just proximal to ulnar styloid process   Across hand at thumb web space   At base of 2nd digit   (Blank rows = not tested)    QUICK DASH SURVEY: 31.82   TODAY'S TREATMENT:  DATE: 06/06/2022  PATIENT EDUCATION:  Education details: Access Code: [email or web address] URL: [email or web address] Date: 06/06/2022 Prepared by: Cheral Almas  Exercises - Supine Shoulder Flexion AAROM with Hands Clasped  - 2 x daily - 7 x weekly - 1 sets - 5 reps - 5 hold - Supine Chest Stretch with Arms Behind Head  - 2 x daily - 7 x weekly - 1 sets - 5 reps - 5  hold - Standing Shoulder Abduction Wall Slide with Thumb Out  - 2 x daily - 7 x weekly - 1 sets - 5 reps - 5 hold Person educated: Patient Education method: Explanation, Demonstration, and Handouts Education comprehension: verbalized understanding and returned demonstration  HOME EXERCISE PROGRAM:  Supine Shoulder Flexion AAROM with Hands Clasped  - 2 x daily - 7 x weekly - 1 sets - 5 reps - 5 hold - Supine Chest Stretch with Arms Behind Head  - 2 x daily - 7 x weekly - 1 sets - 5 reps - 5 hold - Standing Shoulder Abduction Wall Slide with Thumb Out  - 2 x daily - 7 x weekly - 1 sets - 5 reps - 5 hold Person educated: Patient  ASSESSMENT:  CLINICAL IMPRESSION: Patient is a 51 y.o. female who was seen today for physical therapy evaluation and treatment for Left upper back and UE pain s/p Lat Flap reconstruction with implant performed on 05/25/2019. She experiences random sharp pains in the upper back that may radiate under the arm. They don't linger long and she hasn't experienced one in the last week or 2. She thinks repetitive use of her arms overhead may be a trigger..  She is tender and tight  throughout the left Upper quarter and she has limitations in left shoulder ROM especially with abduction and ER.She will benefit from skilled PT to address deficits and return to PLOF. OBJECTIVE IMPAIRMENTS: decreased ROM, decreased strength, impaired UE functional use, postural dysfunction, and pain.   ACTIVITY LIMITATIONS: sleeping, reach over head, and hygiene/grooming  PARTICIPATION LIMITATIONS:  She participates in all with occasional sharp pains  PERSONAL FACTORS: 1-2 comorbidities: Left mastectomy with reconstruction and expanders  are also affecting patient's functional outcome.   REHAB POTENTIAL: Good  CLINICAL DECISION MAKING: Stable/uncomplicated  EVALUATION COMPLEXITY: Low  GOALS: Goals reviewed with patient? Yes LONG TERM GOALS: Target date: 08/01/2022  Pt will have left  shoulder abduction 150 for improved reaching and decreased tightness Baseline: 135 Goal status: INITIAL  2.  Pt will have improved ER on left to 88 degrees for decreased pectoral tightness Baseline: 75 Goal status: INITIAL  3.  Pt will improve quick dash to no greater than 15% Baseline: 31.82 Goal status: INITIAL  4.  Pt will have decreased tenderness/sharp pain by atleast 50% Baseline:  Goal status: INITIAL  PLAN:  PT FREQUENCY: 1-2x/week  PT DURATION: 8 weeks  PLANNED INTERVENTIONS: Therapeutic exercises, Neuromuscular re-education, Patient/Family education, Self Care, Joint mobilization, Orthotic/Fit training, Dry Needling, and Manual therapy  PLAN FOR NEXT SESSION: Pec wall stretch, standing lat stretch, LTR, pulleys, STM left upper quarter/cupping, DN if warranted,PROM, progress to strength as tolerated, measure circumference of arms   Claris Pong, PT 06/06/2022, 10:18 AM

## 2022-06-06 ENCOUNTER — Ambulatory Visit: Payer: 59 | Attending: Student

## 2022-06-06 ENCOUNTER — Other Ambulatory Visit: Payer: Self-pay

## 2022-06-06 DIAGNOSIS — R293 Abnormal posture: Secondary | ICD-10-CM | POA: Diagnosis not present

## 2022-06-06 DIAGNOSIS — Z9889 Other specified postprocedural states: Secondary | ICD-10-CM | POA: Diagnosis not present

## 2022-06-06 DIAGNOSIS — M25612 Stiffness of left shoulder, not elsewhere classified: Secondary | ICD-10-CM | POA: Insufficient documentation

## 2022-06-10 ENCOUNTER — Ambulatory Visit: Payer: 59 | Attending: Student | Admitting: Physical Therapy

## 2022-06-10 DIAGNOSIS — R600 Localized edema: Secondary | ICD-10-CM | POA: Insufficient documentation

## 2022-06-10 DIAGNOSIS — R262 Difficulty in walking, not elsewhere classified: Secondary | ICD-10-CM | POA: Insufficient documentation

## 2022-06-10 DIAGNOSIS — R293 Abnormal posture: Secondary | ICD-10-CM | POA: Insufficient documentation

## 2022-06-10 DIAGNOSIS — Z9889 Other specified postprocedural states: Secondary | ICD-10-CM | POA: Insufficient documentation

## 2022-06-10 DIAGNOSIS — M25612 Stiffness of left shoulder, not elsewhere classified: Secondary | ICD-10-CM | POA: Insufficient documentation

## 2022-06-10 DIAGNOSIS — M6281 Muscle weakness (generalized): Secondary | ICD-10-CM | POA: Diagnosis not present

## 2022-06-10 NOTE — Therapy (Addendum)
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, left ([place]) 01/23/2017   Edema, lower extremity    Hot flashes 03/2017   Hyperlipidemia 03/20/2016   Morbid obesity with body mass index (BMI) of 40.0 to 49.9 ([facility]) 09/17/2017   NCGS (non-celiac gluten sensitivity)    Pre-diabetes 07/26/2018   Hgb A1C elevated on 07/26/2018, Gestational Diabetes 2012   Seasonal allergies 2012   seasonal allergies causes allergic rhinitis and itchy, dry eyes per pt   Vitamin D deficiency 05/2015   Past Surgical History:  Procedure Laterality Date   BREAST RECONSTRUCTION WITH PLACEMENT OF TISSUE EXPANDER AND FLEX HD (ACELLULAR HYDRATED DERMIS) Left 08/27/2017   Procedure: LEFT BREAST RECONSTRUCTION WITH PLACEMENT OF TISSUE EXPANDER AND FLEX HD;  Surgeon: Wallace Going, DO;  Location: [facility];  Service: Plastics;  Laterality: Left;   CESAREAN SECTION     x2   LATISSIMUS FLAP TO BREAST Left 11/03/2018   LATISSIMUS FLAP TO BREAST Left 11/03/2018   Procedure: LATISSIMUS FLAP TO LEFT BREAST;  Surgeon: Wallace Going, DO;  Location: [facility];  Service: Plastics;  Laterality: Left;   MASTECTOMY MODIFIED RADICAL Left 08/27/2017   MASTECTOMY MODIFIED RADICAL Left 08/27/2017   Procedure: LEFT MODIFIED RADICAL MASTECTOMY;  Surgeon: Erroll Luna, MD;  Location: [facility];  Service: General;  Laterality: Left;   MASTOPEXY Right 03/03/2019   Procedure: RIGHT BREAST MASTOPEXY/REDUCTION;  Surgeon: Wallace Going, DO;  Location: [facility];  Service: Plastics;  Laterality: Right;  3  hours, please   PORT-A-CATH REMOVAL Right 08/27/2017   Procedure: REMOVAL PORT-A-CATH RIGHT CHEST;  Surgeon: Erroll Luna, MD;  Location: [facility];  Service: General;  Laterality: Right;   PORTACATH PLACEMENT Right 01/28/2017   Procedure: INSERTION PORT-A-CATH WITH ULTRASOUND;  Surgeon: Erroll Luna, MD;  Location: [facility];  Service: General;  Laterality: Right;   REDUCTION MAMMAPLASTY Right    REMOVAL OF TISSUE EXPANDER AND PLACEMENT OF IMPLANT Left 03/03/2019   Procedure: REMOVAL OF TISSUE EXPANDER AND PLACEMENT OF EXPANDER;  Surgeon: Wallace Going, DO;  Location: [name];  Service: Plastics;  Laterality: Left;   REMOVAL OF TISSUE EXPANDER AND PLACEMENT OF IMPLANT Left 05/25/2019   Procedure: LEFT BREAST REMOVAL OF TISSUE EXPANDER AND PLACEMENT OF IMPLANT;  Surgeon: Wallace Going, DO;  Location: [facility];  Service: Plastics;  Laterality: Left;   TISSUE EXPANDER PLACEMENT Left 11/03/2018   Procedure: PLACEMENT OF TISSUE EXPANDER LEFT BREAST;  Surgeon: Wallace Going, DO;  Location: [facility];  Service: Plastics;  Laterality: Left;  Total case time is 3.5 hours   TUBAL LIGATION Bilateral 01/21/2011   Patient Active Problem List   Diagnosis Date Noted   Boil of buttock, Left 12/24/2021   Hyperthyroidism 07/22/2021   Low serum thyroid stimulating hormone (TSH) 06/12/2021   Other fatigue 05/23/2021   Hemoglobin low 05/23/2021   Polyphagia 05/16/2020   Vitamin D deficiency 05/16/2020   At risk for osteoporosis 05/16/2020   Prediabetes 05/01/2020   Elevated blood pressure reading 05/01/2020   At risk  for impaired metabolic function 82/42/3536   Breast asymmetry following reconstructive surgery 09/20/2019   Essential hypertension 08/17/2018   Class 3 severe obesity with serious comorbidity and body mass index (BMI) of 40.0 to 44.9 in adult [facility]) 09/17/2017   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   Malignant neoplasm of  upper-inner quadrant of left breast in female, estrogen receptor positive ([facility]) 07/27/2017   Healthcare maintenance 03/25/2017   Malignant 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, estrogen receptor positive ([facility]) 01/23/2017   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, left ([facility]) 01/23/2017    PCP: Horald Pollen, MD  REFERRING PROVIDER: Donnamarie Rossetti PA-C  REFERRING DIAG: Post Mastectomy, Left upper back and UE pain  THERAPY DIAG:  Abnormal posture  Status post left breast reconstruction  Stiffness of left shoulder, not elsewhere classified  Difficulty in walking, not elsewhere classified  Localized edema  Muscle weakness (generalized)  ONSET DATE: 11/03/2018 with recent exacerbation  Rationale for Evaluation and Treatment: Rehabilitation  SUBJECTIVE:                                                                                                                                                                                           SUBJECTIVE STATEMENT:  Pain in left upper back that is sharp and comes and goes, almost like a cramp that you need to work out. It is intermittent but rated 8/10 when it occurs. It radiates under the arm and into the left breast area.Last episode was mid December. It gets tender and it may be swollen. It is typical to feel good for a little while and then start back again. She had an [name] in October of the breast which was fine. She notices limitations in her left shoulder ROM. I drop things a lot with my both hands maybe from neuropathy. 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   PERTINENT HISTORY:  Left breast cancer diagnosed 01/10/17 by mammogram and needle biopsy. Then had neo-adjuvant chemo starting 02/10/17 until 06/26/17; had left mastectomy 08/27/17 with ALND and immediate expander placed. Radiation completed. Expander removed and replaced on May 27, 19 b/c it did not get  expanded enough prior to radiation, also had lat flap 11/03/18. Had expander removed and replaced again on 03/03/19 due to a small leak, pt had reduction on R breast at this time   PAIN:  Are you having pain? Nonot presently NPRS scale: 8/10 when it happens Pain location: left upper back radiating under arm Pain orientation: Left and Upper  PAIN TYPE: sharp Pain description:  intermittent and stabbing , sometimes aching Aggravating factors: using arms overhead repetitively,just happens and takes her by surprise Relieving factors: positioning her shoulder  PRECAUTIONS: Other: left UE lymphedema risk  WEIGHT BEARING RESTRICTIONS: No  FALLS:  Has patient fallen in last 6 months? No  LIVING ENVIRONMENT: Lives with: her 54 yr old and 68 yr old daughters Lives in: House/apartment Stairs: No; External: 2 steps; none Has following equipment at home: Crutches and bed side commode  OCCUPATION: [employer], Financial controller  LEISURE: walk, watch movies, spend time with kids.  HAND DOMINANCE: right   PRIOR LEVEL OF FUNCTION: Independent  PATIENT GOALS: Get rid of sharp pain.   OBJECTIVE:  COGNITION: Overall cognitive status: Within functional limits for tasks assessed   PALPATION: Tender left scapular region, lateral trunk, pectorals, very tight UT  OBSERVATIONS / OTHER ASSESSMENTS: no visible swelling however pt with decreased muscle mass noted  left scapular region. Healed incision from lat flap with mild tenderness mid incision. Mild compensation and increased effort noted with left shoulder AROM  SENSATION: Light touch: Appears intact POSTURE: significant forward head, rounded shoulders  UPPER EXTREMITY AROM/PROM:  A/PROM RIGHT   eval   Shoulder extension 55  Shoulder flexion 140  Shoulder abduction 157  Shoulder internal rotation 70  Shoulder external rotation 95    (Blank rows = not tested)  A/PROM LEFT   eval  Shoulder extension 45  Shoulder flexion 132   Shoulder abduction 135  Shoulder internal rotation 54  Shoulder external rotation 75    (Blank rows = not tested)  CERVICAL AROM: All within functional  limits:    UPPER EXTREMITY STRENGTH:  Left grip 40,52,45; avg 45.6 Right grip 50,47,55 avg 50.66   LYMPHEDEMA ASSESSMENTS:   SURGERY TYPE/DATE: Left Modified Radical Mastectomy 08/27/2017, 11/03/2018 Left Lat Flap with tissue expander, 05/25/2019 Removal of expander and placement of implant  NUMBER OF LYMPH NODES REMOVED: 1/4  CHEMOTHERAPY: yes neoadjuvant  RADIATION:yes  HORMONE TREATMENT: yes, Letrozole  INFECTIONS: NO  LYMPHEDEMA ASSESSMENTS:   LANDMARK RIGHT  eval RIGHT  06/10/2022  10 cm proximal to olecranon process  39.5  Olecranon process  30  10 cm proximal to ulnar styloid process  25.9  Just proximal to ulnar styloid process  17.7  Across hand at thumb web space  20  At base of 2nd digit  6.3  (Blank rows = not tested)  LANDMARK LEFT  eval LEFT 06/10/2022  10 cm proximal to olecranon process  40  Olecranon process  31  10 cm proximal to ulnar styloid process  26.4  Just proximal to ulnar styloid process  19  Across hand at thumb web space  20.7  At base of 2nd digit  6.1  (Blank rows = not tested)    QUICK DASH SURVEY: 31.82   TODAY'S TREATMENT:  DATE: 06/10/2022: measured arms.  Pt has slight increase in left arm circumference. Pt has sleeves but is not wearing them. Alerted pt to risks of lymphedema and recommended monitoring and the benefits of exercise.  Performed neck and upper thoracic rotation. Pt reports stiffness in rotation. To supine for lower trunk rotation slowly with stretch and pt instructed to do this at home. Shoulder protraction x 10 for scapular mobility. Then to right sidelying for left shoulder abduction ( pt felt pulling and stretch to left shoulder  area ) and flexion along with small circles to ceiling.  With cocoa butter, soft tissue work to left scapular area with tightness perceived and slight pain in infraspinatus and teres major.Finally, pt instructed to do 5 reps of standing rows with red theraband several times a day at work and home for postural correction and exercise to scapular muscles.     Home exercise and patient education.  Lower trunk rotation : lie on your back with goal post arms and take from side to side Standing Rows: Theraband around doorknob, posture tall with back of head to ceiling, Reach both hands back to hips pinching shoulder blades together and reaching chest forward        06/06/2022  PATIENT EDUCATION:  Education details: Access Code: [email or web address] URL: [email or web address] Date: 06/06/2022 Prepared by: Cheral Almas  Exercises - Supine Shoulder Flexion AAROM with Hands Clasped  - 2 x daily - 7 x weekly - 1 sets - 5 reps - 5 hold - Supine Chest Stretch with Arms Behind Head  - 2 x daily - 7 x weekly - 1 sets - 5 reps - 5 hold - Standing Shoulder Abduction Wall Slide with Thumb Out  - 2 x daily - 7 x weekly - 1 sets - 5 reps - 5 hold Person educated: Patient Education method: Explanation, Demonstration, and Handouts Education comprehension: verbalized understanding and returned demonstration  HOME EXERCISE PROGRAM:  Supine Shoulder Flexion AAROM with Hands Clasped  - 2 x daily - 7 x weekly - 1 sets - 5 reps - 5 hold - Supine Chest Stretch with Arms Behind Head  - 2 x daily - 7 x weekly - 1 sets - 5 reps - 5 hold - Standing Shoulder Abduction Wall Slide with Thumb Out  - 2 x daily - 7 x weekly - 1 sets - 5 reps - 5 hold Person educated: Patient  ASSESSMENT:  CLINICAL IMPRESSION: Pt continues with intermittent pain in her back, but it is not present today. Measured her arms and she has slight increase in left arm circumferences that warrants monitoring for lymphedema.  She has stiffness  in postureal muscles of her thoracic spine and scapular muscles and benefited from soft tissue work and stretching.  Updated home exercise program     OBJECTIVE IMPAIRMENTS: decreased ROM, decreased strength, impaired UE functional use, postural dysfunction, and pain.   ACTIVITY LIMITATIONS: sleeping, reach over head, and hygiene/grooming  PARTICIPATION LIMITATIONS:  She participates in all with occasional sharp pains  PERSONAL FACTORS: 1-2 comorbidities: Left mastectomy with reconstruction and expanders  are also affecting patient's functional outcome.   REHAB POTENTIAL: Good  CLINICAL DECISION MAKING: Stable/uncomplicated  EVALUATION COMPLEXITY: Low  GOALS: Goals reviewed with patient? Yes LONG TERM GOALS: Target date: 08/01/2022  Pt will have left shoulder abduction 150 for improved reaching and decreased tightness Baseline: 135 Goal status: INITIAL  2.  Pt will have improved ER on left to 88 degrees for decreased pectoral tightness Baseline: 75 Goal  status: INITIAL  3.  Pt will improve quick dash to no greater than 15% Baseline: 31.82 Goal status: INITIAL  4.  Pt will have decreased tenderness/sharp pain by atleast 50% Baseline:  Goal status: INITIAL  PLAN:  PT FREQUENCY: 1-2x/week  PT DURATION: 8 weeks  PLANNED INTERVENTIONS: Therapeutic exercises, Neuromuscular re-education, Patient/Family education, Self Care, Joint mobilization, Orthotic/Fit training, Dry Needling, and Manual therapy  PLAN FOR NEXT SESSION: Pec wall stretch, standing lat stretch, LTR, pulleys, STM left upper quarter/cupping, DN if warranted,PROM, progress to strength as tolerated, measure circumference of arms Progress postural exercise.  Monitor left arm for lymphedem, SOZO for currently baseline (see shared blue sticky note)   Donato Heinz. Owens Shark PT  Norwood Levo, PT 06/10/2022, 9:13 AM

## 2022-06-10 NOTE — Patient Instructions (Signed)
Lower trunk rotation : lie on your back with goal post arms and take from side to side   Standing Rows: Theraband around doorknob, posture tall with back of head to ceiling, Reach both hands back to hips pinching shoulder blades together and reaching chest forward

## 2022-06-12 ENCOUNTER — Encounter: Payer: Self-pay | Admitting: Oncology

## 2022-06-12 ENCOUNTER — Other Ambulatory Visit (HOSPITAL_COMMUNITY): Payer: Self-pay

## 2022-06-12 ENCOUNTER — Encounter (INDEPENDENT_AMBULATORY_CARE_PROVIDER_SITE_OTHER): Payer: Self-pay | Admitting: Adult Health

## 2022-06-12 ENCOUNTER — Ambulatory Visit: Payer: 59

## 2022-06-12 ENCOUNTER — Ambulatory Visit (INDEPENDENT_AMBULATORY_CARE_PROVIDER_SITE_OTHER): Payer: 59 | Admitting: Adult Health

## 2022-06-12 VITALS — BP 114/68 | HR 97 | Temp 98.6°F | Ht 65.0 in | Wt 243.0 lb

## 2022-06-12 DIAGNOSIS — R600 Localized edema: Secondary | ICD-10-CM | POA: Diagnosis not present

## 2022-06-12 DIAGNOSIS — Z6841 Body Mass Index (BMI) 40.0 and over, adult: Secondary | ICD-10-CM | POA: Diagnosis not present

## 2022-06-12 DIAGNOSIS — Z9889 Other specified postprocedural states: Secondary | ICD-10-CM | POA: Diagnosis not present

## 2022-06-12 DIAGNOSIS — R293 Abnormal posture: Secondary | ICD-10-CM

## 2022-06-12 DIAGNOSIS — E669 Obesity, unspecified: Secondary | ICD-10-CM | POA: Diagnosis not present

## 2022-06-12 DIAGNOSIS — R7303 Prediabetes: Secondary | ICD-10-CM | POA: Diagnosis not present

## 2022-06-12 DIAGNOSIS — M25612 Stiffness of left shoulder, not elsewhere classified: Secondary | ICD-10-CM

## 2022-06-12 DIAGNOSIS — R262 Difficulty in walking, not elsewhere classified: Secondary | ICD-10-CM

## 2022-06-12 DIAGNOSIS — M6281 Muscle weakness (generalized): Secondary | ICD-10-CM | POA: Diagnosis not present

## 2022-06-12 MED ORDER — SEMAGLUTIDE-WEIGHT MANAGEMENT 1.7 MG/0.75ML ~~LOC~~ SOAJ
1.7000 mg | SUBCUTANEOUS | 0 refills | Status: DC
  Filled 2022-06-12 (×2): qty 3, 28d supply, fill #0

## 2022-06-12 MED ORDER — METFORMIN HCL 500 MG PO TABS
500.0000 mg | ORAL_TABLET | Freq: Two times a day (BID) | ORAL | 0 refills | Status: DC
  Filled 2022-06-12: qty 180, 90d supply, fill #0

## 2022-06-12 NOTE — Therapy (Signed)
OUTPATIENT PHYSICAL THERAPY ONCOLOGY TREATMENT  Patient Name: Mackenzie [name] MRN: [id number] DOB:[birth year], 52 y.o., female Today's Date: 06/12/2022  END OF SESSION:  PT End of Session - 06/12/22 1209     Visit Number 3    Number of Visits 16    Date for PT Re-Evaluation 08/01/22    PT Start Time 1206    PT Stop Time 1308    PT Time Calculation (min) 62 min    Activity Tolerance Patient tolerated treatment well    Behavior During Therapy [facility] for tasks assessed/performed              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   Allergic rhinitis 2012   Anemia 01/27/2017   prior to starting chemotherapy   Breast cancer ([place]) 01/14/2017   Left breast   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, left ([place]) 01/23/2017   Edema, lower extremity    Hot flashes 03/2017   Hyperlipidemia 03/20/2016   Morbid obesity with body mass index (BMI) of 40.0 to 49.9 (HCC) 09/17/2017   NCGS (non-celiac gluten sensitivity)    Pre-diabetes 07/26/2018   Hgb A1C elevated on 07/26/2018, Gestational Diabetes 2012   Seasonal allergies 2012   seasonal allergies causes allergic rhinitis and itchy, dry eyes per pt   Vitamin D deficiency 05/2015   Past Surgical History:  Procedure Laterality Date   BREAST RECONSTRUCTION WITH PLACEMENT OF TISSUE EXPANDER AND FLEX HD (ACELLULAR HYDRATED DERMIS) Left 08/27/2017   Procedure: LEFT BREAST RECONSTRUCTION WITH PLACEMENT OF TISSUE EXPANDER AND FLEX HD;  Surgeon: Wallace Going, DO;  Location: [place];  Service: Plastics;  Laterality: Left;   CESAREAN SECTION     x2   LATISSIMUS FLAP TO BREAST Left 11/03/2018   LATISSIMUS FLAP TO BREAST Left 11/03/2018   Procedure: LATISSIMUS FLAP TO LEFT BREAST;  Surgeon: Wallace Going, DO;  Location: [place];  Service: Plastics;  Laterality: Left;   MASTECTOMY MODIFIED RADICAL Left 08/27/2017   MASTECTOMY MODIFIED RADICAL Left 08/27/2017    Procedure: LEFT MODIFIED RADICAL MASTECTOMY;  Surgeon: Erroll Luna, MD;  Location: [place];  Service: General;  Laterality: Left;   MASTOPEXY Right 03/03/2019   Procedure: RIGHT BREAST MASTOPEXY/REDUCTION;  Surgeon: Wallace Going, DO;  Location: [place];  Service: Plastics;  Laterality: Right;  3 hours, please   PORT-A-CATH REMOVAL Right 08/27/2017   Procedure: REMOVAL PORT-A-CATH RIGHT CHEST;  Surgeon: Erroll Luna, MD;  Location: [place];  Service: General;  Laterality: Right;   PORTACATH PLACEMENT Right 01/28/2017   Procedure: INSERTION PORT-A-CATH WITH ULTRASOUND;  Surgeon: Erroll Luna, MD;  Location: [place];  Service: General;  Laterality: Right;   REDUCTION MAMMAPLASTY Right    REMOVAL OF TISSUE EXPANDER AND PLACEMENT OF IMPLANT Left 03/03/2019   Procedure: REMOVAL OF TISSUE EXPANDER AND PLACEMENT OF EXPANDER;  Surgeon: Wallace Going, DO;  Location: [place];  Service: Plastics;  Laterality: Left;   REMOVAL OF TISSUE EXPANDER AND PLACEMENT OF IMPLANT Left 05/25/2019   Procedure: LEFT BREAST REMOVAL OF TISSUE EXPANDER AND PLACEMENT OF IMPLANT;  Surgeon: Wallace Going, DO;  Location: [place];  Service: Plastics;  Laterality: Left;   TISSUE EXPANDER PLACEMENT Left 11/03/2018   Procedure: PLACEMENT OF TISSUE EXPANDER LEFT BREAST;  Surgeon: Wallace Going, DO;  Location: [place];  Service: Plastics;  Laterality: Left;  Total case time  is 3.5 hours   TUBAL LIGATION Bilateral 01/21/2011   Patient Active Problem List   Diagnosis Date Noted   Boil of buttock, Left 12/24/2021   Hyperthyroidism 07/22/2021   Low serum thyroid stimulating hormone (TSH) 06/12/2021   Other fatigue 05/23/2021   Hemoglobin low 05/23/2021   Polyphagia 05/16/2020   Vitamin D deficiency 05/16/2020   At risk for osteoporosis 05/16/2020   Prediabetes 05/01/2020   Elevated blood pressure reading 05/01/2020   At risk for  impaired metabolic function 16/03/9603   Breast asymmetry following reconstructive surgery 09/20/2019   Essential hypertension 08/17/2018   Class 3 severe obesity with serious comorbidity and body mass index (BMI) of 40.0 to 44.9 in adult [facility]) 09/17/2017   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   Malignant neoplasm of upper-inner quadrant of left breast in female, estrogen receptor positive ([place]) 07/27/2017   Healthcare maintenance 03/25/2017   Malignant 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, estrogen receptor positive ([place]) 01/23/2017   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, left ([place]) 01/23/2017    PCP: Horald Pollen, MD  REFERRING PROVIDER: Donnamarie Rossetti PA-C  REFERRING DIAG: Post Mastectomy, Left upper back and UE pain  THERAPY DIAG:  Abnormal posture  Status post left breast reconstruction  Stiffness of left shoulder, not elsewhere classified  Difficulty in walking, not elsewhere classified  Localized edema  Muscle weakness (generalized)  ONSET DATE: 11/03/2018 with recent exacerbation  Rationale for Evaluation and Treatment: Rehabilitation  SUBJECTIVE:                                                                                                                                                                                           SUBJECTIVE STATEMENT:  I've been doing my stretches more since starting therapy and I've been feeling the spasms more frequently! But it's only in certain positions.    PERTINENT HISTORY:  Left breast cancer diagnosed 01/10/17 by mammogram and needle biopsy. Then had neo-adjuvant chemo starting 02/10/17 until 06/26/17; had left mastectomy 08/27/17 with ALND and immediate expander placed. Radiation completed. Expander removed and replaced on May 27, 19 b/c it did not get expanded enough prior to radiation, also had lat flap 11/03/18. Had expander removed and replaced again on 03/03/19 due to a small leak,  pt had reduction on R breast at this time   PAIN:  Are you having pain? No ,not presently NPRS scale: 6-8/10 when it happens Pain location: left upper back radiating under arm Pain orientation: Left and Upper  PAIN TYPE: sharp Pain description: intermittent and stabbing , sometimes aching Aggravating factors: using arms overhead repetitively,just happens  and takes her by surprise Relieving factors: positioning her shoulder  PRECAUTIONS: Other: left UE lymphedema risk  WEIGHT BEARING RESTRICTIONS: No  FALLS:  Has patient fallen in last 6 months? No  LIVING ENVIRONMENT: Lives with: her 54 yr old and 36 yr old daughters Lives in: House/apartment Stairs: No; External: 2 steps; none Has following equipment at home: Crutches and bed side commode  OCCUPATION: [place], Financial controller  LEISURE: walk, watch movies, spend time with kids.  HAND DOMINANCE: right   PRIOR LEVEL OF FUNCTION: Independent  PATIENT GOALS: Get rid of sharp pain.   OBJECTIVE:  COGNITION: Overall cognitive status: Within functional limits for tasks assessed   PALPATION: Tender left scapular region, lateral trunk, pectorals, very tight UT  OBSERVATIONS / OTHER ASSESSMENTS: no visible swelling however pt with decreased muscle mass noted  left scapular region. Healed incision from lat flap with mild tenderness mid incision. Mild compensation and increased effort noted with left shoulder AROM  SENSATION: Light touch: Appears intact POSTURE: significant forward head, rounded shoulders  UPPER EXTREMITY AROM/PROM:  A/PROM RIGHT   eval   Shoulder extension 55  Shoulder flexion 140  Shoulder abduction 157  Shoulder internal rotation 70  Shoulder external rotation 95    (Blank rows = not tested)  A/PROM LEFT   eval  Shoulder extension 45  Shoulder flexion 132  Shoulder abduction 135  Shoulder internal rotation 54  Shoulder external rotation 75    (Blank rows = not tested)  CERVICAL  AROM: All within functional  limits:    UPPER EXTREMITY STRENGTH:  Left grip 40,52,45; avg 45.6 Right grip 50,47,55 avg 50.66   LYMPHEDEMA ASSESSMENTS:   SURGERY TYPE/DATE: Left Modified Radical Mastectomy 08/27/2017, 11/03/2018 Left Lat Flap with tissue expander, 05/25/2019 Removal of expander and placement of implant  NUMBER OF LYMPH NODES REMOVED: 1/4  CHEMOTHERAPY: yes neoadjuvant  RADIATION:yes  HORMONE TREATMENT: yes, Letrozole  INFECTIONS: NO  LYMPHEDEMA ASSESSMENTS:   LANDMARK RIGHT  eval RIGHT  06/10/2022  10 cm proximal to olecranon process  39.5  Olecranon process  30  10 cm proximal to ulnar styloid process  25.9  Just proximal to ulnar styloid process  17.7  Across hand at thumb web space  20  At base of 2nd digit  6.3  (Blank rows = not tested)  LANDMARK LEFT  eval LEFT 06/10/2022  10 cm proximal to olecranon process  40  Olecranon process  31  10 cm proximal to ulnar styloid process  26.4  Just proximal to ulnar styloid process  19  Across hand at thumb web space  20.7  At base of 2nd digit  6.1  (Blank rows = not tested)    QUICK DASH SURVEY: 31.82   TODAY'S TREATMENT:  DATE: 06/12/22: Therapeutic Exercises Pulleys into flexion and abd x2 mins each with VCs to relax shoulders Ball roll up wall into Lt UE abd and flexion x10 each returning therapist demo Modified downward dog on wall 5x, 5 sec holds returning therapist demo and added to HEP along with end ROM stretching in doorway for Lt lat stretch. Manual Therapy P/ROM to Lt shoulder into flexion, abd and D2 to pts tolerance with scapular depression throughout by therapist; also Rt LTR during P/ROM for increased stretch along Lt lateral trunk STM in Lt S/L to Rt lateral trunk and scapula with cocoa butter  06/10/2022: measured arms.  Pt has slight increase in left  arm circumference. Pt has sleeves but is not wearing them. Alerted pt to risks of lymphedema and recommended monitoring and the benefits of exercise.  Performed neck and upper thoracic rotation. Pt reports stiffness in rotation. To supine for lower trunk rotation slowly with stretch and pt instructed to do this at home. Shoulder protraction x 10 for scapular mobility. Then to right sidelying for left shoulder abduction ( pt felt pulling and stretch to left shoulder area ) and flexion along with small circles to ceiling.  With cocoa butter, soft tissue work to left scapular area with tightness perceived and slight pain in infraspinatus and teres major.Finally, pt instructed to do 5 reps of standing rows with red theraband several times a day at work and home for postural correction and exercise to scapular muscles.     Home exercise and patient education.  Lower trunk rotation : lie on your back with goal post arms and take from side to side Standing Rows: Theraband around doorknob, posture tall with back of head to ceiling, Reach both hands back to hips pinching shoulder blades together and reaching chest forward        06/06/2022  PATIENT EDUCATION:  Education details: Access Code: [email or web address] URL: [email or web address] Date: 06/06/2022 Prepared by: Cheral Almas  Exercises - Supine Shoulder Flexion AAROM with Hands Clasped  - 2 x daily - 7 x weekly - 1 sets - 5 reps - 5 hold - Supine Chest Stretch with Arms Behind Head  - 2 x daily - 7 x weekly - 1 sets - 5 reps - 5 hold - Standing Shoulder Abduction Wall Slide with Thumb Out  - 2 x daily - 7 x weekly - 1 sets - 5 reps - 5 hold Person educated: Patient Education method: Explanation, Demonstration, and Handouts Education comprehension: verbalized understanding and returned demonstration  HOME EXERCISE PROGRAM:  Supine Shoulder Flexion AAROM with Hands Clasped  - 2 x daily - 7 x weekly - 1 sets - 5 reps - 5 hold - Supine  Chest Stretch with Arms Behind Head  - 2 x daily - 7 x weekly - 1 sets - 5 reps - 5 hold - Standing Shoulder Abduction Wall Slide with Thumb Out  - 2 x daily - 7 x weekly - 1 sets - 5 reps - 5 hold Person educated: Patient Access Code: [email or web address] URL: [email or web address] Date: 06/12/2022 Prepared by: Collie Siad  Exercises - Supine Shoulder Flexion AAROM with Hands Clasped  - 2 x daily - 7 x weekly - 1 sets - 5 reps - 5 hold - Supine Chest Stretch with Arms Behind Head  - 2 x daily - 7 x weekly - 1 sets - 5 reps - 5 hold - Standing Shoulder Abduction Wall Slide with Thumb Out  - 2 x daily - 7 x weekly -  1 sets - 5 reps - 5 hold - Latissimus Dorsi Stretch at Wall  - 2-3 x daily - 7 x weekly - 5 reps - 5-10 hold - Supine Lower Trunk Rotation  - 2 x daily - 7 x weekly - 1 sets - 10 reps - 10-20 hold - Shoulder extension with resistance - Neutral  - 1 x daily - 5 x weekly - 1-2 sets - 10 reps - 3 hold - Downward Dog  - 1 x daily - 7 x weekly - 5 reps - 5 hold Person educated: Patient  ASSESSMENT:  CLINICAL IMPRESSION:  Pt feels like the intermittent pain has slightly increased since starting physical therapy but this still is only present when she gets in certain positions (like during toileting). She has been working her new HEP stretches into her day and these were progressed today, see above. She did well reporting feeling good stretches with new exercises. Then continued with manual therapy working to decrease Lt upper quadrant tightness which was some improved by end of session, with pt reporting noticing same.    OBJECTIVE IMPAIRMENTS: decreased ROM, decreased strength, impaired UE functional use, postural dysfunction, and pain.   ACTIVITY LIMITATIONS: sleeping, reach over head, and hygiene/grooming  PARTICIPATION LIMITATIONS:  She participates in all with occasional sharp pains  PERSONAL FACTORS: 1-2 comorbidities: Left mastectomy with reconstruction and  expanders  are also affecting patient's functional outcome.   REHAB POTENTIAL: Good  CLINICAL DECISION MAKING: Stable/uncomplicated  EVALUATION COMPLEXITY: Low  GOALS: Goals reviewed with patient? Yes LONG TERM GOALS: Target date: 08/01/2022  Pt will have left shoulder abduction 150 for improved reaching and decreased tightness Baseline: 135 Goal status: INITIAL  2.  Pt will have improved ER on left to 88 degrees for decreased pectoral tightness Baseline: 75 Goal status: INITIAL  3.  Pt will improve quick dash to no greater than 15% Baseline: 31.82 Goal status: INITIAL  4.  Pt will have decreased tenderness/sharp pain by atleast 50% Baseline:  Goal status: INITIAL  PLAN:  PT FREQUENCY: 1-2x/week  PT DURATION: 8 weeks  PLANNED INTERVENTIONS: Therapeutic exercises, Neuromuscular re-education, Patient/Family education, Self Care, Joint mobilization, Orthotic/Fit training, Dry Needling, and Manual therapy  PLAN FOR NEXT SESSION: Pec wall stretch, standing lat stretch, LTR, pulleys, STM left upper quarter/cupping, DN if warranted,PROM, progress to strength as tolerated, measure circumference of arms Progress postural exercise.  Monitor left arm for lymphedem, SOZO for currently baseline (see shared blue sticky note)   Collie Siad, PTA 06/12/22 1:18 PM

## 2022-06-14 ENCOUNTER — Other Ambulatory Visit (HOSPITAL_COMMUNITY): Payer: Self-pay

## 2022-06-16 ENCOUNTER — Other Ambulatory Visit: Payer: Self-pay

## 2022-06-16 ENCOUNTER — Other Ambulatory Visit (HOSPITAL_COMMUNITY): Payer: Self-pay

## 2022-06-16 MED ORDER — GABAPENTIN 300 MG PO CAPS
300.0000 mg | ORAL_CAPSULE | Freq: Two times a day (BID) | ORAL | 0 refills | Status: DC
  Filled 2022-06-16: qty 180, 90d supply, fill #0

## 2022-06-16 MED ORDER — LOSARTAN POTASSIUM 50 MG PO TABS
50.0000 mg | ORAL_TABLET | Freq: Every day | ORAL | 0 refills | Status: DC
  Filled 2022-06-16: qty 90, 90d supply, fill #0

## 2022-06-16 NOTE — Progress Notes (Unsigned)
Chief Complaint:   OBESITY Mackenzie [name] is here to discuss her progress with her obesity treatment plan along with follow-up of her obesity related diagnoses. Mackenzie [name] is on the Category 3 Plan+ journal intake and states she is following her eating plan approximately 75% of the time. Mackenzie [name] states she is walking and strength training 30-45 minutes 2-3 times per week.  Today's visit was #: 18 Starting weight: 41 LBS Starting date: 08/09/2019 Today's weight: 243 LBS Today's date: 06/12/2022 Total lbs lost to date: 24 LBS Total lbs lost since last in-office visit: +4 LBS  Interim History: ***  Subjective:   1. Pre-diabetes A1c *** Currently on metformin 500 mg twice daily and Wegovy.  Assessment/Plan:   1. Pre-diabetes Refill- metFORMIN (GLUCOPHAGE) 500 MG tablet; Take 1 tablet (500 mg total) by mouth 2 (two) times daily with a meal.  Dispense: 180 tablet; Refill: 0  2. Obesity,current BMI 40.5 Refill- Semaglutide-Weight Management 1.7 MG/0.75ML SOAJ; Inject 1.7 mg into the skin once a week.  Dispense: 3 mL; Refill: 0  Mackenzie [name] is currently in the action stage of change. As such, her goal is to continue with weight loss efforts. She has agreed to the Category 3 Plan and keeping a food journal and adhering to recommended goals of 1500 calories and *** protein.   Exercise goals:  As is.  Behavioral modification strategies: increasing lean protein intake, decreasing simple carbohydrates, meal planning and cooking strategies, keeping healthy foods in the home, and planning for success.  Mackenzie [name] has agreed to follow-up with our clinic in 4 weeks. She was informed of the importance of frequent follow-up visits to maximize her success with intensive lifestyle modifications for her multiple health conditions.   Objective:   Blood pressure 114/68, pulse 97, temperature 98.6 F (37 C), height '5\' 5"'$  (1.651 m), weight 243 lb (110.2 kg), last menstrual period 11/28/2016, SpO2 98 %, unknown if  currently breastfeeding. Body mass index is 40.44 kg/m.  General: Cooperative, alert, well developed, in no acute distress. HEENT: Conjunctivae and lids unremarkable. Cardiovascular: Regular rhythm.  Lungs: Normal work of breathing. Neurologic: No focal deficits.   Lab Results  Component Value Date   CREATININE 0.88 01/29/2022   BUN 16 01/29/2022   NA 137 01/29/2022   K 4.5 01/29/2022   CL 101 01/29/2022   CO2 23 01/29/2022   Lab Results  Component Value Date   ALT 15 01/29/2022   AST 16 01/29/2022   GGT 19 03/01/2021   ALKPHOS 177 (H) 01/29/2022   BILITOT 0.3 01/29/2022   Lab Results  Component Value Date   HGBA1C 5.4 01/29/2022   HGBA1C 5.4 12/19/2021   HGBA1C 5.4 05/23/2021   HGBA1C 5.4 02/27/2021   HGBA1C 5.4 11/15/2020   Lab Results  Component Value Date   INSULIN 21.3 01/29/2022   INSULIN 18.2 01/07/2022   INSULIN 15.1 05/23/2021   INSULIN 15.1 02/27/2021   INSULIN 11.7 11/15/2020   Lab Results  Component Value Date   TSH 1.813 04/25/2022   Lab Results  Component Value Date   CHOL 202 (H) 11/15/2020   HDL 58 11/15/2020   LDLCALC 135 (H) 11/15/2020   TRIG 52 11/15/2020   CHOLHDL 3.5 11/15/2020   Lab Results  Component Value Date   VD25OH 50.2 01/29/2022   VD25OH 49.2 12/19/2021   VD25OH 102.0 (H) 05/23/2021   Lab Results  Component Value Date   WBC 3.8 (L) 07/23/2021   HGB 11.5 (L) 07/23/2021   HCT 35.9 (L) 07/23/2021  MCV 79.6 07/23/2021   PLT 201.0 07/23/2021   No results found for: "IRON", "TIBC", "FERRITIN"  Attestation Statements:   Reviewed by clinician on day of visit: allergies, medications, problem list, medical history, surgical history, family history, social history, and previous encounter notes.  I, Davy Pique, RMA, am acting as Location manager for [name], NP.  I have reviewed the above documentation for accuracy and completeness, and I agree with the above. -  ***

## 2022-06-17 ENCOUNTER — Other Ambulatory Visit (HOSPITAL_COMMUNITY): Payer: Self-pay

## 2022-06-17 ENCOUNTER — Ambulatory Visit: Payer: 59 | Admitting: Physical Therapy

## 2022-06-17 ENCOUNTER — Encounter: Payer: Self-pay | Admitting: Physical Therapy

## 2022-06-17 DIAGNOSIS — R293 Abnormal posture: Secondary | ICD-10-CM

## 2022-06-17 DIAGNOSIS — M25612 Stiffness of left shoulder, not elsewhere classified: Secondary | ICD-10-CM | POA: Diagnosis not present

## 2022-06-17 DIAGNOSIS — M6281 Muscle weakness (generalized): Secondary | ICD-10-CM | POA: Diagnosis not present

## 2022-06-17 DIAGNOSIS — Z9889 Other specified postprocedural states: Secondary | ICD-10-CM

## 2022-06-17 DIAGNOSIS — R262 Difficulty in walking, not elsewhere classified: Secondary | ICD-10-CM | POA: Diagnosis not present

## 2022-06-17 DIAGNOSIS — R600 Localized edema: Secondary | ICD-10-CM | POA: Diagnosis not present

## 2022-06-17 NOTE — Therapy (Signed)
OUTPATIENT PHYSICAL THERAPY ONCOLOGY TREATMENT  Patient Name: Mackenzie [name] MRN: [id number] DOB:16-Apr-1971, 52 y.o., female Today's Date: 06/17/2022  END OF SESSION:  PT End of Session - 06/17/22 0905     Visit Number 4    Number of Visits 16    Date for PT Re-Evaluation 08/01/22    PT Start Time 0902    PT Stop Time 8413    PT Time Calculation (min) 53 min    Activity Tolerance Patient tolerated treatment well    Behavior During Therapy [facility] for tasks assessed/performed              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   Allergic rhinitis 2012   Anemia 01/27/2017   prior to starting chemotherapy   Breast cancer ([place]) 01/14/2017   Left breast   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, left ([place]) 01/23/2017   Edema, lower extremity    Hot flashes 03/2017   Hyperlipidemia 03/20/2016   Morbid obesity with body mass index (BMI) of 40.0 to 49.9 (HCC) 09/17/2017   NCGS (non-celiac gluten sensitivity)    Pre-diabetes 07/26/2018   Hgb A1C elevated on 07/26/2018, Gestational Diabetes 2012   Seasonal allergies 2012   seasonal allergies causes allergic rhinitis and itchy, dry eyes per pt   Vitamin D deficiency 05/2015   Past Surgical History:  Procedure Laterality Date   BREAST RECONSTRUCTION WITH PLACEMENT OF TISSUE EXPANDER AND FLEX HD (ACELLULAR HYDRATED DERMIS) Left 08/27/2017   Procedure: LEFT BREAST RECONSTRUCTION WITH PLACEMENT OF TISSUE EXPANDER AND FLEX HD;  Surgeon: Wallace Going, DO;  Location: [place];  Service: Plastics;  Laterality: Left;   CESAREAN SECTION     x2   LATISSIMUS FLAP TO BREAST Left 11/03/2018   LATISSIMUS FLAP TO BREAST Left 11/03/2018   Procedure: LATISSIMUS FLAP TO LEFT BREAST;  Surgeon: Wallace Going, DO;  Location: [place];  Service: Plastics;  Laterality: Left;   MASTECTOMY MODIFIED RADICAL Left 08/27/2017   MASTECTOMY MODIFIED RADICAL Left 08/27/2017    Procedure: LEFT MODIFIED RADICAL MASTECTOMY;  Surgeon: Erroll Luna, MD;  Location: [place];  Service: General;  Laterality: Left;   MASTOPEXY Right 03/03/2019   Procedure: RIGHT BREAST MASTOPEXY/REDUCTION;  Surgeon: Wallace Going, DO;  Location: [place];  Service: Plastics;  Laterality: Right;  3 hours, please   PORT-A-CATH REMOVAL Right 08/27/2017   Procedure: REMOVAL PORT-A-CATH RIGHT CHEST;  Surgeon: Erroll Luna, MD;  Location: [place];  Service: General;  Laterality: Right;   PORTACATH PLACEMENT Right 01/28/2017   Procedure: INSERTION PORT-A-CATH WITH ULTRASOUND;  Surgeon: Erroll Luna, MD;  Location: [place];  Service: General;  Laterality: Right;   REDUCTION MAMMAPLASTY Right    REMOVAL OF TISSUE EXPANDER AND PLACEMENT OF IMPLANT Left 03/03/2019   Procedure: REMOVAL OF TISSUE EXPANDER AND PLACEMENT OF EXPANDER;  Surgeon: Wallace Going, DO;  Location: [place];  Service: Plastics;  Laterality: Left;   REMOVAL OF TISSUE EXPANDER AND PLACEMENT OF IMPLANT Left 05/25/2019   Procedure: LEFT BREAST REMOVAL OF TISSUE EXPANDER AND PLACEMENT OF IMPLANT;  Surgeon: Wallace Going, DO;  Location: [place];  Service: Plastics;  Laterality: Left;   TISSUE EXPANDER PLACEMENT Left 11/03/2018   Procedure: PLACEMENT OF TISSUE EXPANDER LEFT BREAST;  Surgeon: Wallace Going, DO;  Location: [place];  Service: Plastics;  Laterality: Left;  Total case time  is 3.5 hours   TUBAL LIGATION Bilateral 01/21/2011   Patient Active Problem List   Diagnosis Date Noted   Boil of buttock, Left 12/24/2021   Hyperthyroidism 07/22/2021   Low serum thyroid stimulating hormone (TSH) 06/12/2021   Other fatigue 05/23/2021   Hemoglobin low 05/23/2021   Polyphagia 05/16/2020   Vitamin D deficiency 05/16/2020   At risk for osteoporosis 05/16/2020   Prediabetes 05/01/2020   Elevated blood pressure reading 05/01/2020   At risk for  impaired metabolic function 14/43/1540   Breast asymmetry following reconstructive surgery 09/20/2019   Essential hypertension 08/17/2018   Class 3 severe obesity with serious comorbidity and body mass index (BMI) of 40.0 to 44.9 in adult [facility]) 09/17/2017   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   Malignant neoplasm of upper-inner quadrant of left breast in female, estrogen receptor positive ([place]) 07/27/2017   Healthcare maintenance 03/25/2017   Malignant 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, estrogen receptor positive ([place]) 01/23/2017   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, left ([place]) 01/23/2017    PCP: Horald Pollen, MD  REFERRING PROVIDER: Donnamarie Rossetti PA-C  REFERRING DIAG: Post Mastectomy, Left upper back and UE pain  THERAPY DIAG:  Stiffness of left shoulder, not elsewhere classified  Status post left breast reconstruction  Difficulty in walking, not elsewhere classified  Localized edema  Abnormal posture  ONSET DATE: 11/03/2018 with recent exacerbation  Rationale for Evaluation and Treatment: Rehabilitation  SUBJECTIVE:                                                                                                                                                                                           SUBJECTIVE STATEMENT: Last night I started to have pain in my breast that went in to my back. I was doing a lot of cooking and dishes. I had to lie down and when I woke up it was doing better. I was using my R hand to clean the dish and the pain was on my left side.    PERTINENT HISTORY:  Left breast cancer diagnosed 01/10/17 by mammogram and needle biopsy. Then had neo-adjuvant chemo starting 02/10/17 until 06/26/17; had left mastectomy 08/27/17 with ALND and immediate expander placed. Radiation completed. Expander removed and replaced on May 27, 19 b/c it did not get expanded enough prior to radiation, also had lat flap 11/03/18. Had  expander removed and replaced again on 03/03/19 due to a small leak, pt had reduction on R breast at this time   PAIN:  Are you having pain? No ,not presently NPRS scale: 6-8/10 when it happens Pain location: left  upper back radiating under arm Pain orientation: Left and Upper  PAIN TYPE: sharp Pain description: intermittent and stabbing , sometimes aching Aggravating factors: using arms overhead repetitively,just happens and takes her by surprise Relieving factors: positioning her shoulder  PRECAUTIONS: Other: left UE lymphedema risk  WEIGHT BEARING RESTRICTIONS: No  FALLS:  Has patient fallen in last 6 months? No  LIVING ENVIRONMENT: Lives with: her 55 yr old and 28 yr old daughters Lives in: House/apartment Stairs: No; External: 2 steps; none Has following equipment at home: Crutches and bed side commode  OCCUPATION: [employer], Financial controller  LEISURE: walk, watch movies, spend time with kids.  HAND DOMINANCE: right   PRIOR LEVEL OF FUNCTION: Independent  PATIENT GOALS: Get rid of sharp pain.   OBJECTIVE:  COGNITION: Overall cognitive status: Within functional limits for tasks assessed   PALPATION: Tender left scapular region, lateral trunk, pectorals, very tight UT  OBSERVATIONS / OTHER ASSESSMENTS: no visible swelling however pt with decreased muscle mass noted  left scapular region. Healed incision from lat flap with mild tenderness mid incision. Mild compensation and increased effort noted with left shoulder AROM  SENSATION: Light touch: Appears intact POSTURE: significant forward head, rounded shoulders  UPPER EXTREMITY AROM/PROM:  A/PROM RIGHT   eval   Shoulder extension 55  Shoulder flexion 140  Shoulder abduction 157  Shoulder internal rotation 70  Shoulder external rotation 95    (Blank rows = not tested)  A/PROM LEFT   eval  Shoulder extension 45  Shoulder flexion 132  Shoulder abduction 135  Shoulder internal rotation 54  Shoulder  external rotation 75    (Blank rows = not tested)  CERVICAL AROM: All within functional  limits:    UPPER EXTREMITY STRENGTH:  Left grip 40,52,45; avg 45.6 Right grip 50,47,55 avg 50.66   LYMPHEDEMA ASSESSMENTS:   SURGERY TYPE/DATE: Left Modified Radical Mastectomy 08/27/2017, 11/03/2018 Left Lat Flap with tissue expander, 05/25/2019 Removal of expander and placement of implant  NUMBER OF LYMPH NODES REMOVED: 1/4  CHEMOTHERAPY: yes neoadjuvant  RADIATION:yes  HORMONE TREATMENT: yes, Letrozole  INFECTIONS: NO  LYMPHEDEMA ASSESSMENTS:   LANDMARK RIGHT  eval RIGHT  06/10/2022  10 cm proximal to olecranon process  39.5  Olecranon process  30  10 cm proximal to ulnar styloid process  25.9  Just proximal to ulnar styloid process  17.7  Across hand at thumb web space  20  At base of 2nd digit  6.3  (Blank rows = not tested)  LANDMARK LEFT  eval LEFT 06/10/2022  10 cm proximal to olecranon process  40  Olecranon process  31  10 cm proximal to ulnar styloid process  26.4  Just proximal to ulnar styloid process  19  Across hand at thumb web space  20.7  At base of 2nd digit  6.1  (Blank rows = not tested)    QUICK DASH SURVEY: 31.82   TODAY'S TREATMENT:  DATE: 06/17/22: Therapeutic Exercises Pulleys into flexion and abd x2 mins each with VCs to relax shoulders Ball roll up wall into Lt UE abd and flexion x10 each returning therapist demo Instructed pt in self massage with tennis ball on wall and added to HEP Manual Therapy P/ROM to Lt shoulder into flexion, abd and ER STM in Rt S/L to Lt lateral trunk and scapula with cocoa butter, increased tightness noted at lats and serratus  06/12/22: Therapeutic Exercises Pulleys into flexion and abd x2 mins each with VCs to relax shoulders Ball roll up wall into Lt UE abd and flexion x10 each  returning therapist demo Modified downward dog on wall 5x, 5 sec holds returning therapist demo and added to HEP along with end ROM stretching in doorway for Lt lat stretch. Manual Therapy P/ROM to Lt shoulder into flexion, abd and D2 to pts tolerance with scapular depression throughout by therapist; also Rt LTR during P/ROM for increased stretch along Lt lateral trunk STM in Lt S/L to Rt lateral trunk and scapula with cocoa butter Gentle rib mobilizations to L ribs just inferior to L breast where pt felt pain and had tenderness. Initially this was very tender but eased with mobilization   06/10/2022: measured arms.  Pt has slight increase in left arm circumference. Pt has sleeves but is not wearing them. Alerted pt to risks of lymphedema and recommended monitoring and the benefits of exercise.  Performed neck and upper thoracic rotation. Pt reports stiffness in rotation. To supine for lower trunk rotation slowly with stretch and pt instructed to do this at home. Shoulder protraction x 10 for scapular mobility. Then to right sidelying for left shoulder abduction ( pt felt pulling and stretch to left shoulder area ) and flexion along with small circles to ceiling.  With cocoa butter, soft tissue work to left scapular area with tightness perceived and slight pain in infraspinatus and teres major.Finally, pt instructed to do 5 reps of standing rows with red theraband several times a day at work and home for postural correction and exercise to scapular muscles.     Home exercise and patient education.  Lower trunk rotation : lie on your back with goal post arms and take from side to side Standing Rows: Theraband around doorknob, posture tall with back of head to ceiling, Reach both hands back to hips pinching shoulder blades together and reaching chest forward        06/06/2022  PATIENT EDUCATION:  Education details: Access Code: [email or web address] URL: [email or web address] Date:  06/06/2022 Prepared by: Cheral Almas  Exercises - Supine Shoulder Flexion AAROM with Hands Clasped  - 2 x daily - 7 x weekly - 1 sets - 5 reps - 5 hold - Supine Chest Stretch with Arms Behind Head  - 2 x daily - 7 x weekly - 1 sets - 5 reps - 5 hold - Standing Shoulder Abduction Wall Slide with Thumb Out  - 2 x daily - 7 x weekly - 1 sets - 5 reps - 5 hold Person educated: Patient Education method: Explanation, Demonstration, and Handouts Education comprehension: verbalized understanding and returned demonstration  HOME EXERCISE PROGRAM:  Supine Shoulder Flexion AAROM with Hands Clasped  - 2 x daily - 7 x weekly - 1 sets - 5 reps - 5 hold - Supine Chest Stretch with Arms Behind Head  - 2 x daily - 7 x weekly - 1 sets - 5 reps - 5 hold - Standing Shoulder Abduction Wall Slide with Thumb  Out  - 2 x daily - 7 x weekly - 1 sets - 5 reps - 5 hold Person educated: Patient Access Code: [email or web address] URL: [email or web address] Date: 06/12/2022 Prepared by: Collie Siad  Exercises - Supine Shoulder Flexion AAROM with Hands Clasped  - 2 x daily - 7 x weekly - 1 sets - 5 reps - 5 hold - Supine Chest Stretch with Arms Behind Head  - 2 x daily - 7 x weekly - 1 sets - 5 reps - 5 hold - Standing Shoulder Abduction Wall Slide with Thumb Out  - 2 x daily - 7 x weekly - 1 sets - 5 reps - 5 hold - Latissimus Dorsi Stretch at Wall  - 2-3 x daily - 7 x weekly - 5 reps - 5-10 hold - Supine Lower Trunk Rotation  - 2 x daily - 7 x weekly - 1 sets - 10 reps - 10-20 hold - Shoulder extension with resistance - Neutral  - 1 x daily - 5 x weekly - 1-2 sets - 10 reps - 3 hold - Downward Dog  - 1 x daily - 7 x weekly - 5 reps - 5 hold Person educated: Patient - Tennis Neurosurgeon Massage on Wall  - 1 x daily - 7 x weekly - 1 sets - 10 reps  ASSESSMENT:  CLINICAL IMPRESSION:  Pt is continuing to have intermittent pain in her back but yesterday it started just under her L breast. Today she has  sensitivity along her ribs in this area and gentle rib mobilization was helpful. Educated pt to begin self massage with tennis ball on wall to decrease muscle tightness. Continued with PROM to L shoulder in all directions and encouraged pt to continue to do stretches at home to open rib cage and back to decrease pain.    OBJECTIVE IMPAIRMENTS: decreased ROM, decreased strength, impaired UE functional use, postural dysfunction, and pain.   ACTIVITY LIMITATIONS: sleeping, reach over head, and hygiene/grooming  PARTICIPATION LIMITATIONS:  She participates in all with occasional sharp pains  PERSONAL FACTORS: 1-2 comorbidities: Left mastectomy with reconstruction and expanders  are also affecting patient's functional outcome.   REHAB POTENTIAL: Good  CLINICAL DECISION MAKING: Stable/uncomplicated  EVALUATION COMPLEXITY: Low  GOALS: Goals reviewed with patient? Yes LONG TERM GOALS: Target date: 08/01/2022  Pt will have left shoulder abduction 150 for improved reaching and decreased tightness Baseline: 135 Goal status: INITIAL  2.  Pt will have improved ER on left to 88 degrees for decreased pectoral tightness Baseline: 75 Goal status: INITIAL  3.  Pt will improve quick dash to no greater than 15% Baseline: 31.82 Goal status: INITIAL  4.  Pt will have decreased tenderness/sharp pain by atleast 50% Baseline:  Goal status: INITIAL  PLAN:  PT FREQUENCY: 1-2x/week  PT DURATION: 8 weeks  PLANNED INTERVENTIONS: Therapeutic exercises, Neuromuscular re-education, Patient/Family education, Self Care, Joint mobilization, Orthotic/Fit training, Dry Needling, and Manual therapy  PLAN FOR NEXT SESSION: Pec wall stretch, standing lat stretch, LTR, pulleys, STM left upper quarter/cupping, DN if warranted,PROM, progress to strength as tolerated, measure circumference of arms Progress postural exercise.  Monitor left arm for lymphedem, SOZO for currently baseline (see shared blue sticky note)    Collie Siad, PTA 06/17/22 10:01 AM

## 2022-06-18 ENCOUNTER — Ambulatory Visit (INDEPENDENT_AMBULATORY_CARE_PROVIDER_SITE_OTHER): Payer: Self-pay

## 2022-06-18 DIAGNOSIS — M779 Enthesopathy, unspecified: Secondary | ICD-10-CM

## 2022-06-18 NOTE — Progress Notes (Signed)
Patient presents today to pick up custom molded foot orthotics, diagnosed with tendinitis by Dr. Paulla Dolly.   Orthotics were dispensed and fit was satisfactory. Reviewed instructions for break-in and wear. Written instructions given to patient.  Patient will follow up as needed.   [facility] - order # [id number]

## 2022-06-19 ENCOUNTER — Encounter: Payer: Self-pay | Admitting: Physical Therapy

## 2022-06-19 ENCOUNTER — Other Ambulatory Visit (HOSPITAL_COMMUNITY): Payer: Self-pay

## 2022-06-19 ENCOUNTER — Ambulatory Visit: Payer: 59 | Admitting: Physical Therapy

## 2022-06-19 DIAGNOSIS — M6281 Muscle weakness (generalized): Secondary | ICD-10-CM | POA: Diagnosis not present

## 2022-06-19 DIAGNOSIS — Z9889 Other specified postprocedural states: Secondary | ICD-10-CM | POA: Diagnosis not present

## 2022-06-19 DIAGNOSIS — R293 Abnormal posture: Secondary | ICD-10-CM | POA: Diagnosis not present

## 2022-06-19 DIAGNOSIS — R262 Difficulty in walking, not elsewhere classified: Secondary | ICD-10-CM | POA: Diagnosis not present

## 2022-06-19 DIAGNOSIS — R600 Localized edema: Secondary | ICD-10-CM

## 2022-06-19 DIAGNOSIS — M25612 Stiffness of left shoulder, not elsewhere classified: Secondary | ICD-10-CM | POA: Diagnosis not present

## 2022-06-19 NOTE — Therapy (Signed)
OUTPATIENT PHYSICAL THERAPY ONCOLOGY TREATMENT  Patient Name: Mackenzie [name] MRN: [id number] DOB:Apr 30, 1971, 52 y.o., female Today's Date: 06/19/2022  END OF SESSION:  PT End of Session - 06/19/22 0908     Visit Number 5    Number of Visits 16    PT Start Time 0907    PT Stop Time 0959    PT Time Calculation (min) 52 min    Activity Tolerance Patient tolerated treatment well    Behavior During Therapy [facility] for tasks assessed/performed              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   Allergic rhinitis 2012   Anemia 01/27/2017   prior to starting chemotherapy   Breast cancer ([place]) 01/14/2017   Left breast   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, left ([place]) 01/23/2017   Edema, lower extremity    Hot flashes 03/2017   Hyperlipidemia 03/20/2016   Morbid obesity with body mass index (BMI) of 40.0 to 49.9 (HCC) 09/17/2017   NCGS (non-celiac gluten sensitivity)    Pre-diabetes 07/26/2018   Hgb A1C elevated on 07/26/2018, Gestational Diabetes 2012   Seasonal allergies 2012   seasonal allergies causes allergic rhinitis and itchy, dry eyes per pt   Vitamin D deficiency 05/2015   Past Surgical History:  Procedure Laterality Date   BREAST RECONSTRUCTION WITH PLACEMENT OF TISSUE EXPANDER AND FLEX HD (ACELLULAR HYDRATED DERMIS) Left 08/27/2017   Procedure: LEFT BREAST RECONSTRUCTION WITH PLACEMENT OF TISSUE EXPANDER AND FLEX HD;  Surgeon: Wallace Going, DO;  Location: [place];  Service: Plastics;  Laterality: Left;   CESAREAN SECTION     x2   LATISSIMUS FLAP TO BREAST Left 11/03/2018   LATISSIMUS FLAP TO BREAST Left 11/03/2018   Procedure: LATISSIMUS FLAP TO LEFT BREAST;  Surgeon: Wallace Going, DO;  Location: [place];  Service: Plastics;  Laterality: Left;   MASTECTOMY MODIFIED RADICAL Left 08/27/2017   MASTECTOMY MODIFIED RADICAL Left 08/27/2017   Procedure: LEFT MODIFIED RADICAL  MASTECTOMY;  Surgeon: Erroll Luna, MD;  Location: [place];  Service: General;  Laterality: Left;   MASTOPEXY Right 03/03/2019   Procedure: RIGHT BREAST MASTOPEXY/REDUCTION;  Surgeon: Wallace Going, DO;  Location: [place];  Service: Plastics;  Laterality: Right;  3 hours, please   PORT-A-CATH REMOVAL Right 08/27/2017   Procedure: REMOVAL PORT-A-CATH RIGHT CHEST;  Surgeon: Erroll Luna, MD;  Location: [place];  Service: General;  Laterality: Right;   PORTACATH PLACEMENT Right 01/28/2017   Procedure: INSERTION PORT-A-CATH WITH ULTRASOUND;  Surgeon: Erroll Luna, MD;  Location: [place];  Service: General;  Laterality: Right;   REDUCTION MAMMAPLASTY Right    REMOVAL OF TISSUE EXPANDER AND PLACEMENT OF IMPLANT Left 03/03/2019   Procedure: REMOVAL OF TISSUE EXPANDER AND PLACEMENT OF EXPANDER;  Surgeon: Wallace Going, DO;  Location: [place];  Service: Plastics;  Laterality: Left;   REMOVAL OF TISSUE EXPANDER AND PLACEMENT OF IMPLANT Left 05/25/2019   Procedure: LEFT BREAST REMOVAL OF TISSUE EXPANDER AND PLACEMENT OF IMPLANT;  Surgeon: Wallace Going, DO;  Location: [place];  Service: Plastics;  Laterality: Left;   TISSUE EXPANDER PLACEMENT Left 11/03/2018   Procedure: PLACEMENT OF TISSUE EXPANDER LEFT BREAST;  Surgeon: Wallace Going, DO;  Location: [place];  Service: Plastics;  Laterality: Left;  Total case time is 3.5 hours   TUBAL LIGATION Bilateral  01/21/2011   Patient Active Problem List   Diagnosis Date Noted   Boil of buttock, Left 12/24/2021   Hyperthyroidism 07/22/2021   Low serum thyroid stimulating hormone (TSH) 06/12/2021   Other fatigue 05/23/2021   Hemoglobin low 05/23/2021   Polyphagia 05/16/2020   Vitamin D deficiency 05/16/2020   At risk for osteoporosis 05/16/2020   Prediabetes 05/01/2020   Elevated blood pressure reading 05/01/2020   At risk for impaired metabolic function  56/43/3295   Breast asymmetry following reconstructive surgery 09/20/2019   Essential hypertension 08/17/2018   Class 3 severe obesity with serious comorbidity and body mass index (BMI) of 40.0 to 44.9 in adult [facility]) 09/17/2017   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   Malignant neoplasm of upper-inner quadrant of left breast in female, estrogen receptor positive ([place]) 07/27/2017   Healthcare maintenance 03/25/2017   Malignant 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, estrogen receptor positive ([place]) 01/23/2017   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, left ([place]) 01/23/2017    PCP: Horald Pollen, MD  REFERRING PROVIDER: Donnamarie Rossetti PA-C  REFERRING DIAG: Post Mastectomy, Left upper back and UE pain  THERAPY DIAG:  Stiffness of left shoulder, not elsewhere classified  Status post left breast reconstruction  Difficulty in walking, not elsewhere classified  Localized edema  Abnormal posture  ONSET DATE: 11/03/2018 with recent exacerbation  Rationale for Evaluation and Treatment: Rehabilitation  SUBJECTIVE:                                                                                                                                                                                           SUBJECTIVE STATEMENT: I have not had that pain since the last time but I have not done any cooking.    PERTINENT HISTORY:  Left breast cancer diagnosed 01/10/17 by mammogram and needle biopsy. Then had neo-adjuvant chemo starting 02/10/17 until 06/26/17; had left mastectomy 08/27/17 with ALND and immediate expander placed. Radiation completed. Expander removed and replaced on May 27, 19 b/c it did not get expanded enough prior to radiation, also had lat flap 11/03/18. Had expander removed and replaced again on 03/03/19 due to a small leak, pt had reduction on R breast at this time   PAIN:  Are you having pain? No ,not presently  PRECAUTIONS: Other: left UE  lymphedema risk  WEIGHT BEARING RESTRICTIONS: No  FALLS:  Has patient fallen in last 6 months? No  LIVING ENVIRONMENT: Lives with: her 54 yr old and 86 yr old daughters Lives in: House/apartment Stairs: No; External: 2 steps; none Has following equipment at home: Crutches and bed side commode  OCCUPATION: [employer], [employer]  LEISURE: walk, watch movies, spend time with kids.  HAND DOMINANCE: right   PRIOR LEVEL OF FUNCTION: Independent  PATIENT GOALS: Get rid of sharp pain.   OBJECTIVE:  COGNITION: Overall cognitive status: Within functional limits for tasks assessed   PALPATION: Tender left scapular region, lateral trunk, pectorals, very tight UT  OBSERVATIONS / OTHER ASSESSMENTS: no visible swelling however pt with decreased muscle mass noted  left scapular region. Healed incision from lat flap with mild tenderness mid incision. Mild compensation and increased effort noted with left shoulder AROM  SENSATION: Light touch: Appears intact POSTURE: significant forward head, rounded shoulders  UPPER EXTREMITY AROM/PROM:  A/PROM RIGHT   eval   Shoulder extension 55  Shoulder flexion 140  Shoulder abduction 157  Shoulder internal rotation 70  Shoulder external rotation 95    (Blank rows = not tested)  A/PROM LEFT   eval LEFT 06/19/22  Shoulder extension 45   Shoulder flexion 132 158  Shoulder abduction 135 156  Shoulder internal rotation 54   Shoulder external rotation 75     (Blank rows = not tested)  CERVICAL AROM: All within functional  limits:    UPPER EXTREMITY STRENGTH:  Left grip 40,52,45; avg 45.6 Right grip 50,47,55 avg 50.66   LYMPHEDEMA ASSESSMENTS:   SURGERY TYPE/DATE: Left Modified Radical Mastectomy 08/27/2017, 11/03/2018 Left Lat Flap with tissue expander, 05/25/2019 Removal of expander and placement of implant  NUMBER OF LYMPH NODES REMOVED: 1/4  CHEMOTHERAPY: yes neoadjuvant  RADIATION:yes  HORMONE TREATMENT: yes,  Letrozole  INFECTIONS: NO  LYMPHEDEMA ASSESSMENTS:   LANDMARK RIGHT  eval RIGHT  06/10/2022  10 cm proximal to olecranon process 39.5 39.5  Olecranon process '30 30  10 '$ cm proximal to ulnar styloid process 24 25.9  Just proximal to ulnar styloid process 17.5 17.7  Across hand at thumb web space 20 20  At base of 2nd digit 6.4 6.3  (Blank rows = not tested)  LANDMARK LEFT  eval LEFT 06/10/2022  10 cm proximal to olecranon process 39.5 40  Olecranon process 29.'7 31  10 '$ cm proximal to ulnar styloid process 25.5 26.4  Just proximal to ulnar styloid process 18.7 19  Across hand at thumb web space 19.6 20.7  At base of 2nd digit 6.4 6.1  (Blank rows = not tested)    QUICK DASH SURVEY: 31.82   L-DEX FLOWSHEETS - 06/19/22 0900       L-DEX LYMPHEDEMA SCREENING   Measurement Type Unilateral    L-DEX MEASUREMENT EXTREMITY Upper Extremity    POSITION  Standing    DOMINANT SIDE Right    At Risk Side Left    BASELINE SCORE (UNILATERAL) 5.3             L-DEX FLOWSHEETS - 06/19/22 0900       L-DEX LYMPHEDEMA SCREENING   Measurement Type Unilateral    L-DEX MEASUREMENT EXTREMITY Upper Extremity    POSITION  Standing    DOMINANT SIDE Right    At Risk Side Left    BASELINE SCORE (UNILATERAL) 5.3            The patient was assessed using the L-Dex machine today to produce a lymphedema index baseline score. The patient will be reassessed on a regular basis (typically every 3 months) to obtain new L-Dex scores. If the score is > 6.5 points away from his/her baseline score indicating onset of subclinical lymphedema, it will be recommended to wear a compression garment  for 4 weeks, 12 hours per day and then be reassessed. If the score continues to be > 6.5 points from baseline at reassessment, we will initiate lymphedema treatment. Assessing in this manner has a 95% rate of preventing clinically significant lymphedema.  TODAY'S TREATMENT:                                                                                                                                          DATE: 06/19/22: Circumference measurements taken today and SOZO completed, ROM also assessed Therapeutic Exercises Pulleys into flexion and abd x2 mins each with VCs to relax shoulders Ball roll up wall into Lt UE abd and flexion x10 each returning therapist demo Instructed pt in supine scapular strengthening series: narrow and wide grip, horizontal abduction, ER, diagonals with pt returning therapist demo for all x 10 reps with yellow band - required v/c to keep scapula engaged throughout to avoid impingement like pain- added to HEP  06/17/22: Therapeutic Exercises Pulleys into flexion and abd x2 mins each with VCs to relax shoulders Ball roll up wall into Lt UE abd and flexion x10 each returning therapist demo Instructed pt in self massage with tennis ball on wall and added to HEP Manual Therapy P/ROM to Lt shoulder into flexion, abd and ER STM in Rt S/L to Lt lateral trunk and scapula with cocoa butter, increased tightness noted at lats and serratus  06/12/22: Therapeutic Exercises Pulleys into flexion and abd x2 mins each with VCs to relax shoulders Ball roll up wall into Lt UE abd and flexion x10 each returning therapist demo Modified downward dog on wall 5x, 5 sec holds returning therapist demo and added to HEP along with end ROM stretching in doorway for Lt lat stretch. Manual Therapy P/ROM to Lt shoulder into flexion, abd and D2 to pts tolerance with scapular depression throughout by therapist; also Rt LTR during P/ROM for increased stretch along Lt lateral trunk STM in Lt S/L to Rt lateral trunk and scapula with cocoa butter Gentle rib mobilizations to L ribs just inferior to L breast where pt felt pain and had tenderness. Initially this was very tender but eased with mobilization   06/10/2022: measured arms.  Pt has slight increase in left arm circumference. Pt has sleeves but is not wearing  them. Alerted pt to risks of lymphedema and recommended monitoring and the benefits of exercise.  Performed neck and upper thoracic rotation. Pt reports stiffness in rotation. To supine for lower trunk rotation slowly with stretch and pt instructed to do this at home. Shoulder protraction x 10 for scapular mobility. Then to right sidelying for left shoulder abduction ( pt felt pulling and stretch to left shoulder area ) and flexion along with small circles to ceiling.  With cocoa butter, soft tissue work to left scapular area with tightness perceived and slight pain in infraspinatus and teres major.Finally, pt instructed  to do 5 reps of standing rows with red theraband several times a day at work and home for postural correction and exercise to scapular muscles.     Home exercise and patient education.  Lower trunk rotation : lie on your back with goal post arms and take from side to side Standing Rows: Theraband around doorknob, posture tall with back of head to ceiling, Reach both hands back to hips pinching shoulder blades together and reaching chest forward        06/06/2022  PATIENT EDUCATION:  Education details: Access Code: [email or web address] URL: [email or web address] Date: 06/06/2022 Prepared by: Cheral Almas  Exercises - Supine Shoulder Flexion AAROM with Hands Clasped  - 2 x daily - 7 x weekly - 1 sets - 5 reps - 5 hold - Supine Chest Stretch with Arms Behind Head  - 2 x daily - 7 x weekly - 1 sets - 5 reps - 5 hold - Standing Shoulder Abduction Wall Slide with Thumb Out  - 2 x daily - 7 x weekly - 1 sets - 5 reps - 5 hold Person educated: Patient Education method: Explanation, Demonstration, and Handouts Education comprehension: verbalized understanding and returned demonstration  HOME EXERCISE PROGRAM:  Supine Shoulder Flexion AAROM with Hands Clasped  - 2 x daily - 7 x weekly - 1 sets - 5 reps - 5 hold - Supine Chest Stretch with Arms Behind Head  - 2 x daily - 7 x  weekly - 1 sets - 5 reps - 5 hold - Standing Shoulder Abduction Wall Slide with Thumb Out  - 2 x daily - 7 x weekly - 1 sets - 5 reps - 5 hold Person educated: Patient Access Code: [email or web address] URL: [email or web address] Date: 06/12/2022 Prepared by: Collie Siad  Exercises Access Code: [email or web address] URL: [email or web address] Date: 06/19/2022 Prepared by: Manus Gunning  Exercises - Supine Shoulder Flexion AAROM with Hands Clasped  - 2 x daily - 7 x weekly - 1 sets - 5 reps - 5 hold - Supine Chest Stretch with Arms Behind Head  - 2 x daily - 7 x weekly - 1 sets - 5 reps - 5 hold - Standing Shoulder Abduction Wall Slide with Thumb Out  - 2 x daily - 7 x weekly - 1 sets - 5 reps - 5 hold - Latissimus Dorsi Stretch at Wall  - 2-3 x daily - 7 x weekly - 5 reps - 5-10 hold - Supine Lower Trunk Rotation  - 2 x daily - 7 x weekly - 1 sets - 10 reps - 10-20 hold - Shoulder extension with resistance - Neutral  - 1 x daily - 5 x weekly - 1-2 sets - 10 reps - 3 hold - Downward Dog  - 1 x daily - 7 x weekly - 5 reps - 5 hold - Tennis Ball Self Massage on Wall  - 1 x daily - 7 x weekly - 1 sets - 10 reps - Supine Shoulder External Rotation with Resistance  - 1 x daily - 4-5 x weekly - 1 sets - 10 reps - Supine Shoulder Horizontal Abduction with Resistance  - 1 x daily - 4-5 x weekly - 1 sets - 10 reps - Supine narrow and wide grip flexion  - 1 x daily - 4-5 x weekly - 1-2 sets - 10 reps - 3 sec hold - Supine PNF D2   - 1 x daily - 4-5 x weekly - 1-2 sets - 10 reps - 3 sec hold  ASSESSMENT:  CLINICAL IMPRESSION:  Bilateral UE circumferences measurements taken for a baseline and SOZO was completed as well for baseline. Remeasured L shoulder ROM and it has improved greatly since evaluation. Instructed pt in supine scapular strengthening exercises today and had pt return demo 10 reps of each with yellow band and added these to pt's HEP. Pt felt a good stretch with  these on both sides and required cueing to keep scapula engaged throughout to avoid pain.    OBJECTIVE IMPAIRMENTS: decreased ROM, decreased strength, impaired UE functional use, postural dysfunction, and pain.   ACTIVITY LIMITATIONS: sleeping, reach over head, and hygiene/grooming  PARTICIPATION LIMITATIONS:  She participates in all with occasional sharp pains  PERSONAL FACTORS: 1-2 comorbidities: Left mastectomy with reconstruction and expanders  are also affecting patient's functional outcome.   REHAB POTENTIAL: Good  CLINICAL DECISION MAKING: Stable/uncomplicated  EVALUATION COMPLEXITY: Low  GOALS: Goals reviewed with patient? Yes LONG TERM GOALS: Target date: 08/01/2022  Pt will have left shoulder abduction 150 for improved reaching and decreased tightness Baseline: 135 Goal status: INITIAL  2.  Pt will have improved ER on left to 88 degrees for decreased pectoral tightness Baseline: 75 Goal status: INITIAL  3.  Pt will improve quick dash to no greater than 15% Baseline: 31.82 Goal status: INITIAL  4.  Pt will have decreased tenderness/sharp pain by atleast 50% Baseline:  Goal status: INITIAL  PLAN:  PT FREQUENCY: 1-2x/week  PT DURATION: 8 weeks  PLANNED INTERVENTIONS: Therapeutic exercises, Neuromuscular re-education, Patient/Family education, Self Care, Joint mobilization, Orthotic/Fit training, Dry Needling, and Manual therapy  PLAN FOR NEXT SESSION: Pec wall stretch, standing lat stretch, LTR, pulleys, STM left upper quarter/cupping, DN if warranted,PROM, progress to strength as tolerated, measure circumference of arms Progress postural exercise.  Monitor left arm for lymphedem, SOZO for currently baseline (see shared blue sticky note)   Collie Siad, PTA 06/19/22 10:04 AM

## 2022-06-20 ENCOUNTER — Ambulatory Visit: Payer: Commercial Managed Care - PPO | Admitting: Student

## 2022-06-20 ENCOUNTER — Encounter: Payer: Self-pay | Admitting: Student

## 2022-06-20 VITALS — BP 120/78 | HR 91 | Ht 65.0 in | Wt 246.0 lb

## 2022-06-20 DIAGNOSIS — Z853 Personal history of malignant neoplasm of breast: Secondary | ICD-10-CM | POA: Diagnosis not present

## 2022-06-20 DIAGNOSIS — Z9012 Acquired absence of left breast and nipple: Secondary | ICD-10-CM

## 2022-06-20 DIAGNOSIS — Z923 Personal history of irradiation: Secondary | ICD-10-CM

## 2022-06-20 DIAGNOSIS — Z9889 Other specified postprocedural states: Secondary | ICD-10-CM

## 2022-06-20 NOTE — Progress Notes (Signed)
Referring Provider [name], MD [address] [place],  [place] [address]   CC:  Chief Complaint  Patient presents with   Follow-up      Mackenzie [name] is an 52 y.o. female.  HPI: Patient is a 52 year old female with history of left breast cancer found in 2018.  She underwent chemotherapy followed by mastectomy and radiation.  Patient subsequently underwent latissimus flap in May 2020 with expander placement followed by an implant placement.  The implant was placed in December 2020 and it was a Product manager ultrahigh profile 750 cc gel implant.  Patient was last seen in the clinic on 05/22/2022.  At this visit, patient reported that she was experiencing some pain to her left back.  She reported that it was a sharp pain that happens intermittently and lasts a few minutes.  Patient had not gone to physical therapy yet.  On exam, left breast implant was in place.  It was soft.  There were some overlying skin changes due to radiation.  Incisions to the left breast had healed well.  To the back, the incision was intact.  There is some very mild tenderness to palpation near the left posterior/axillary region.  There was no overlying erythema.  Plan is for patient to start physical therapy and follow-up in 1 month.  Today, patient reports she is doing well.  She states that she started physical therapy and has completed about 5 sessions.  Patient reports that she feels physical therapy has been improving her back pain.  She reports that she is only experienced 1 episode of the pain to her back since starting physical therapy.  She denies any other issues or concerns at this time.  Patient reports that she has an appointment with her oncologist February 1.  Review of Systems General: Denies any fevers or chills  Physical Exam    06/20/2022    8:44 AM 06/12/2022    9:00 AM 05/22/2022   11:00 AM  Vitals with BMI  Height '5\' 5"'$  '5\' 5"'$  '5\' 5"'$   Weight 246 lbs 243 lbs 239 lbs  BMI 40.94  08.14 48.18  Systolic 563 149 702  Diastolic 78 68 70  Pulse 91 97 86    General:  No acute distress,  Alert and oriented, Non-Toxic, Normal speech and affect Chaperone present on exam.  On exam, patient is sitting upright in no acute distress.  Left implant is in place.  It is soft.  There are some overlying skin changes due to radiation to the left breast.  There are no cellulitic changes to the skin.  Incision to the back is intact and completely healed.  There is some very minimal/mild tenderness to palpation to the left lateral back/posterior axillary region.  There is no overlying skin changes.  Assessment/Plan  S/P breast reconstruction   Overall it appears the patient is improving with her back pain with physical therapy.  I discussed with the patient to continue physical therapy and see how she does.  She states she has about 8 more sessions left.  I discussed with the patient that we can do a telephone visit in about 2 months after she has completely finished physical therapy.  Patient was in agreement with this plan.  I also discussed with the patient that I do not think there is need for further imaging at this time.  I discussed with the patient that the typical recommendation for imaging after getting an implant is about  5 years after implant was placed.  I did discuss with the patient though that if she continues to have issues and pain to her back even after she finishes physical therapy, we can consider imaging at a later time.  Patient expressed understanding and was in agreement with this plan.  I did also discussed with the patient that I do want her to follow-up yearly with [name] so that we can reevaluate her and how she is doing with her implant.  Will plan to see the patient back in person in clinic in December 2024.  Plan for virtual visit in 2 months.  I discussed with the patient to call in the meantime if she has any questions or concerns.  Clance Boll 06/20/2022,  9:42 AM

## 2022-06-23 ENCOUNTER — Other Ambulatory Visit (INDEPENDENT_AMBULATORY_CARE_PROVIDER_SITE_OTHER): Payer: Self-pay | Admitting: Adult Health

## 2022-06-23 ENCOUNTER — Encounter (INDEPENDENT_AMBULATORY_CARE_PROVIDER_SITE_OTHER): Payer: Self-pay | Admitting: Adult Health

## 2022-06-23 ENCOUNTER — Ambulatory Visit: Payer: 59

## 2022-06-24 ENCOUNTER — Encounter: Payer: Self-pay | Admitting: Oncology

## 2022-06-24 ENCOUNTER — Encounter (INDEPENDENT_AMBULATORY_CARE_PROVIDER_SITE_OTHER): Payer: Self-pay | Admitting: Adult Health

## 2022-06-24 ENCOUNTER — Other Ambulatory Visit (INDEPENDENT_AMBULATORY_CARE_PROVIDER_SITE_OTHER): Payer: Self-pay | Admitting: Adult Health

## 2022-06-24 ENCOUNTER — Telehealth (INDEPENDENT_AMBULATORY_CARE_PROVIDER_SITE_OTHER): Payer: Self-pay

## 2022-06-24 ENCOUNTER — Other Ambulatory Visit (HOSPITAL_COMMUNITY): Payer: Self-pay

## 2022-06-24 DIAGNOSIS — I10 Essential (primary) hypertension: Secondary | ICD-10-CM | POA: Diagnosis not present

## 2022-06-24 DIAGNOSIS — M79661 Pain in right lower leg: Secondary | ICD-10-CM | POA: Diagnosis not present

## 2022-06-24 DIAGNOSIS — G4733 Obstructive sleep apnea (adult) (pediatric): Secondary | ICD-10-CM | POA: Diagnosis not present

## 2022-06-24 DIAGNOSIS — R5383 Other fatigue: Secondary | ICD-10-CM | POA: Diagnosis not present

## 2022-06-24 MED ORDER — INSULIN PEN NEEDLE 32G X 4 MM MISC
1.0000 | Freq: Every day | 1 refills | Status: DC
  Filled 2022-06-24: qty 100, 90d supply, fill #0

## 2022-06-24 MED ORDER — SAXENDA 18 MG/3ML ~~LOC~~ SOPN
1.8000 mg | PEN_INJECTOR | Freq: Every day | SUBCUTANEOUS | 0 refills | Status: DC
  Filled 2022-06-24 – 2022-06-27 (×2): qty 9, 30d supply, fill #0

## 2022-06-24 NOTE — Telephone Encounter (Addendum)
PA submitted for [employer], awaiting approval. 06/24/22  PA approved for [employer] from 06/26/22 until 10/16/22

## 2022-06-25 ENCOUNTER — Encounter: Payer: Self-pay | Admitting: Oncology

## 2022-06-25 ENCOUNTER — Ambulatory Visit: Payer: 59

## 2022-06-25 DIAGNOSIS — R262 Difficulty in walking, not elsewhere classified: Secondary | ICD-10-CM | POA: Diagnosis not present

## 2022-06-25 DIAGNOSIS — Z9889 Other specified postprocedural states: Secondary | ICD-10-CM

## 2022-06-25 DIAGNOSIS — M25612 Stiffness of left shoulder, not elsewhere classified: Secondary | ICD-10-CM | POA: Diagnosis not present

## 2022-06-25 DIAGNOSIS — M6281 Muscle weakness (generalized): Secondary | ICD-10-CM | POA: Diagnosis not present

## 2022-06-25 DIAGNOSIS — R600 Localized edema: Secondary | ICD-10-CM | POA: Diagnosis not present

## 2022-06-25 DIAGNOSIS — R293 Abnormal posture: Secondary | ICD-10-CM | POA: Diagnosis not present

## 2022-06-25 NOTE — Therapy (Signed)
OUTPATIENT PHYSICAL THERAPY ONCOLOGY TREATMENT  Patient Name: Mackenzie [name] MRN: [id number] DOB:[birth year], 52 y.o., female Today's Date: 06/25/2022  END OF SESSION:  PT End of Session - 06/25/22 1001     Visit Number 6    Number of Visits 16    Date for PT Re-Evaluation 08/01/22    PT Start Time 1540    PT Stop Time 1051    PT Time Calculation (min) 49 min    Activity Tolerance Patient tolerated treatment well    Behavior During Therapy [facility] for tasks assessed/performed              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   Allergic rhinitis 2012   Anemia 01/27/2017   prior to starting chemotherapy   Breast cancer ([place]) 01/14/2017   Left breast   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, left ([place]) 01/23/2017   Edema, lower extremity    Hot flashes 03/2017   Hyperlipidemia 03/20/2016   Morbid obesity with body mass index (BMI) of 40.0 to 49.9 (HCC) 09/17/2017   NCGS (non-celiac gluten sensitivity)    Pre-diabetes 07/26/2018   Hgb A1C elevated on 07/26/2018, Gestational Diabetes 2012   Seasonal allergies 2012   seasonal allergies causes allergic rhinitis and itchy, dry eyes per pt   Vitamin D deficiency 05/2015   Past Surgical History:  Procedure Laterality Date   BREAST RECONSTRUCTION WITH PLACEMENT OF TISSUE EXPANDER AND FLEX HD (ACELLULAR HYDRATED DERMIS) Left 08/27/2017   Procedure: LEFT BREAST RECONSTRUCTION WITH PLACEMENT OF TISSUE EXPANDER AND FLEX HD;  Surgeon: Wallace Going, DO;  Location: [place];  Service: Plastics;  Laterality: Left;   CESAREAN SECTION     x2   LATISSIMUS FLAP TO BREAST Left 11/03/2018   LATISSIMUS FLAP TO BREAST Left 11/03/2018   Procedure: LATISSIMUS FLAP TO LEFT BREAST;  Surgeon: Wallace Going, DO;  Location: [place];  Service: Plastics;  Laterality: Left;   MASTECTOMY MODIFIED RADICAL Left 08/27/2017   MASTECTOMY MODIFIED RADICAL Left 08/27/2017    Procedure: LEFT MODIFIED RADICAL MASTECTOMY;  Surgeon: Erroll Luna, MD;  Location: [place];  Service: General;  Laterality: Left;   MASTOPEXY Right 03/03/2019   Procedure: RIGHT BREAST MASTOPEXY/REDUCTION;  Surgeon: Wallace Going, DO;  Location: [place];  Service: Plastics;  Laterality: Right;  3 hours, please   PORT-A-CATH REMOVAL Right 08/27/2017   Procedure: REMOVAL PORT-A-CATH RIGHT CHEST;  Surgeon: Erroll Luna, MD;  Location: [place];  Service: General;  Laterality: Right;   PORTACATH PLACEMENT Right 01/28/2017   Procedure: INSERTION PORT-A-CATH WITH ULTRASOUND;  Surgeon: Erroll Luna, MD;  Location: [place];  Service: General;  Laterality: Right;   REDUCTION MAMMAPLASTY Right    REMOVAL OF TISSUE EXPANDER AND PLACEMENT OF IMPLANT Left 03/03/2019   Procedure: REMOVAL OF TISSUE EXPANDER AND PLACEMENT OF EXPANDER;  Surgeon: Wallace Going, DO;  Location: [place];  Service: Plastics;  Laterality: Left;   REMOVAL OF TISSUE EXPANDER AND PLACEMENT OF IMPLANT Left 05/25/2019   Procedure: LEFT BREAST REMOVAL OF TISSUE EXPANDER AND PLACEMENT OF IMPLANT;  Surgeon: Wallace Going, DO;  Location: [place];  Service: Plastics;  Laterality: Left;   TISSUE EXPANDER PLACEMENT Left 11/03/2018   Procedure: PLACEMENT OF TISSUE EXPANDER LEFT BREAST;  Surgeon: Wallace Going, DO;  Location: [place];  Service: Plastics;  Laterality: Left;  Total case time  is 3.5 hours   TUBAL LIGATION Bilateral 01/21/2011   Patient Active Problem List   Diagnosis Date Noted   Boil of buttock, Left 12/24/2021   Hyperthyroidism 07/22/2021   Low serum thyroid stimulating hormone (TSH) 06/12/2021   Other fatigue 05/23/2021   Hemoglobin low 05/23/2021   Polyphagia 05/16/2020   Vitamin D deficiency 05/16/2020   At risk for osteoporosis 05/16/2020   Prediabetes 05/01/2020   Elevated blood pressure reading 05/01/2020   At risk for  impaired metabolic function 46/56/8127   Breast asymmetry following reconstructive surgery 09/20/2019   Essential hypertension 08/17/2018   Class 3 severe obesity with serious comorbidity and body mass index (BMI) of 40.0 to 44.9 in adult [facility]) 09/17/2017   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   Malignant neoplasm of upper-inner quadrant of left breast in female, estrogen receptor positive ([place]) 07/27/2017   Healthcare maintenance 03/25/2017   Malignant 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, estrogen receptor positive ([place]) 01/23/2017   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, left ([place]) 01/23/2017    PCP: Horald Pollen, MD  REFERRING PROVIDER: Donnamarie Rossetti PA-C  REFERRING DIAG: Post Mastectomy, Left upper back and UE pain  THERAPY DIAG:  Stiffness of left shoulder, not elsewhere classified  Status post left breast reconstruction  Abnormal posture  ONSET DATE: 11/03/2018 with recent exacerbation  Rationale for Evaluation and Treatment: Rehabilitation  SUBJECTIVE:                                                                                                                                                                                           SUBJECTIVE STATEMENT: I had a little pain on Monday but not as intense as it had been. Trying to keep up with HEP. ROM has made good improvement. I have not tried the theraband exercises.   PERTINENT HISTORY:  Left breast cancer diagnosed 01/10/17 by mammogram and needle biopsy. Then had neo-adjuvant chemo starting 02/10/17 until 06/26/17; had left mastectomy 08/27/17 with ALND and immediate expander placed. Radiation completed. Expander removed and replaced on May 27, 19 b/c it did not get expanded enough prior to radiation, also had lat flap 11/03/18. Had expander removed and replaced again on 03/03/19 due to a small leak, pt had reduction on R breast at this time   PAIN:  Are you having pain? No ,not  presently  PRECAUTIONS: Other: left UE lymphedema risk  WEIGHT BEARING RESTRICTIONS: No  FALLS:  Has patient fallen in last 6 months? No  LIVING ENVIRONMENT: Lives with: her 54 yr old and 42 yr old daughters Lives in: House/apartment Stairs: No; External: 2  steps; none Has following equipment at home: Crutches and bed side commode  OCCUPATION: [employer], Financial controller  LEISURE: walk, watch movies, spend time with kids.  HAND DOMINANCE: right   PRIOR LEVEL OF FUNCTION: Independent  PATIENT GOALS: Get rid of sharp pain.   OBJECTIVE:  COGNITION: Overall cognitive status: Within functional limits for tasks assessed   PALPATION: Tender left scapular region, lateral trunk, pectorals, very tight UT  OBSERVATIONS / OTHER ASSESSMENTS: no visible swelling however pt with decreased muscle mass noted  left scapular region. Healed incision from lat flap with mild tenderness mid incision. Mild compensation and increased effort noted with left shoulder AROM  SENSATION: Light touch: Appears intact POSTURE: significant forward head, rounded shoulders  UPPER EXTREMITY AROM/PROM:  A/PROM RIGHT   eval   Shoulder extension 55  Shoulder flexion 140  Shoulder abduction 157  Shoulder internal rotation 70  Shoulder external rotation 95    (Blank rows = not tested)  A/PROM LEFT   eval LEFT 06/19/22  Shoulder extension 45   Shoulder flexion 132 158  Shoulder abduction 135 156  Shoulder internal rotation 54   Shoulder external rotation 75     (Blank rows = not tested)  CERVICAL AROM: All within functional  limits:    UPPER EXTREMITY STRENGTH:  Left grip 40,52,45; avg 45.6 Right grip 50,47,55 avg 50.66   LYMPHEDEMA ASSESSMENTS:   SURGERY TYPE/DATE: Left Modified Radical Mastectomy 08/27/2017, 11/03/2018 Left Lat Flap with tissue expander, 05/25/2019 Removal of expander and placement of implant  NUMBER OF LYMPH NODES REMOVED: 1/4  CHEMOTHERAPY: yes  neoadjuvant  RADIATION:yes  HORMONE TREATMENT: yes, Letrozole  INFECTIONS: NO  LYMPHEDEMA ASSESSMENTS:   LANDMARK RIGHT  eval RIGHT  06/10/2022  10 cm proximal to olecranon process 39.5 39.5  Olecranon process '30 30  10 '$ cm proximal to ulnar styloid process 24 25.9  Just proximal to ulnar styloid process 17.5 17.7  Across hand at thumb web space 20 20  At base of 2nd digit 6.4 6.3  (Blank rows = not tested)  LANDMARK LEFT  eval LEFT 06/10/2022  10 cm proximal to olecranon process 39.5 40  Olecranon process 29.'7 31  10 '$ cm proximal to ulnar styloid process 25.5 26.4  Just proximal to ulnar styloid process 18.7 19  Across hand at thumb web space 19.6 20.7  At base of 2nd digit 6.4 6.1  (Blank rows = not tested)    QUICK DASH SURVEY: 31.82      The patient was assessed using the L-Dex machine today to produce a lymphedema index baseline score. The patient will be reassessed on a regular basis (typically every 3 months) to obtain new L-Dex scores. If the score is > 6.5 points away from his/her baseline score indicating onset of subclinical lymphedema, it will be recommended to wear a compression garment for 4 weeks, 12 hours per day and then be reassessed. If the score continues to be > 6.5 points from baseline at reassessment, we will initiate lymphedema treatment. Assessing in this manner has a 95% rate of preventing clinically significant lymphedema.  TODAY'S TREATMENT:  DATE:  06/25/2022 STM to left pectorals, lats, UT and in SL to scapular region and lats. PROM left shoulder flex, scaption, ER Supine wand flexion and scaption x 5 Supine AROM bilateral shoulder flexion, scaption, horizontal abd x 5 Reviewed supine scapular strengthening exercises for all with yellow band x 10VC's to keep scapula engaged Pt set up 6 month  SOZO 06/19/22: Circumference measurements taken today and SOZO completed, ROM also assessed Therapeutic Exercises Pulleys into flexion and abd x2 mins each with VCs to relax shoulders Ball roll up wall into Lt UE abd and flexion x10 each returning therapist demo Instructed pt in supine scapular strengthening series: narrow and wide grip, horizontal abduction, ER, diagonals with pt returning therapist demo for all x 10 reps with yellow band - required v/c to keep scapula engaged throughout to avoid impingement like pain- added to HEP  06/17/22: Therapeutic Exercises Pulleys into flexion and abd x2 mins each with VCs to relax shoulders Ball roll up wall into Lt UE abd and flexion x10 each returning therapist demo Instructed pt in self massage with tennis ball on wall and added to HEP Manual Therapy P/ROM to Lt shoulder into flexion, abd and ER STM in Rt S/L to Lt lateral trunk and scapula with cocoa butter, increased tightness noted at lats and serratus  06/12/22: Therapeutic Exercises Pulleys into flexion and abd x2 mins each with VCs to relax shoulders Ball roll up wall into Lt UE abd and flexion x10 each returning therapist demo Modified downward dog on wall 5x, 5 sec holds returning therapist demo and added to HEP along with end ROM stretching in doorway for Lt lat stretch. Manual Therapy P/ROM to Lt shoulder into flexion, abd and D2 to pts tolerance with scapular depression throughout by therapist; also Rt LTR during P/ROM for increased stretch along Lt lateral trunk STM in Lt S/L to Rt lateral trunk and scapula with cocoa butter Gentle rib mobilizations to L ribs just inferior to L breast where pt felt pain and had tenderness. Initially this was very tender but eased with mobilization   06/10/2022: measured arms.  Pt has slight increase in left arm circumference. Pt has sleeves but is not wearing them. Alerted pt to risks of lymphedema and recommended monitoring and the benefits of exercise.   Performed neck and upper thoracic rotation. Pt reports stiffness in rotation. To supine for lower trunk rotation slowly with stretch and pt instructed to do this at home. Shoulder protraction x 10 for scapular mobility. Then to right sidelying for left shoulder abduction ( pt felt pulling and stretch to left shoulder area ) and flexion along with small circles to ceiling.  With cocoa butter, soft tissue work to left scapular area with tightness perceived and slight pain in infraspinatus and teres major.Finally, pt instructed to do 5 reps of standing rows with red theraband several times a day at work and home for postural correction and exercise to scapular muscles.     Home exercise and patient education.  Lower trunk rotation : lie on your back with goal post arms and take from side to side Standing Rows: Theraband around doorknob, posture tall with back of head to ceiling, Reach both hands back to hips pinching shoulder blades together and reaching chest forward        06/06/2022  PATIENT EDUCATION:  Education details: Access Code: [email or web address] URL: [email or web address] Date: 06/06/2022 Prepared by: Cheral Almas  Exercises - Supine Shoulder Flexion AAROM with Hands Clasped  - 2 x  daily - 7 x weekly - 1 sets - 5 reps - 5 hold - Supine Chest Stretch with Arms Behind Head  - 2 x daily - 7 x weekly - 1 sets - 5 reps - 5 hold - Standing Shoulder Abduction Wall Slide with Thumb Out  - 2 x daily - 7 x weekly - 1 sets - 5 reps - 5 hold Person educated: Patient Education method: Explanation, Demonstration, and Handouts Education comprehension: verbalized understanding and returned demonstration  HOME EXERCISE PROGRAM:  Supine Shoulder Flexion AAROM with Hands Clasped  - 2 x daily - 7 x weekly - 1 sets - 5 reps - 5 hold - Supine Chest Stretch with Arms Behind Head  - 2 x daily - 7 x weekly - 1 sets - 5 reps - 5 hold - Standing Shoulder Abduction Wall Slide with Thumb Out  - 2 x  daily - 7 x weekly - 1 sets - 5 reps - 5 hold Person educated: Patient Access Code: [email or web address] URL: [email or web address] Date: 06/12/2022 Prepared by: Collie Siad  Exercises Access Code: [email or web address] URL: [email or web address] Date: 06/19/2022 Prepared by: Manus Gunning  Exercises - Supine Shoulder Flexion AAROM with Hands Clasped  - 2 x daily - 7 x weekly - 1 sets - 5 reps - 5 hold - Supine Chest Stretch with Arms Behind Head  - 2 x daily - 7 x weekly - 1 sets - 5 reps - 5 hold - Standing Shoulder Abduction Wall Slide with Thumb Out  - 2 x daily - 7 x weekly - 1 sets - 5 reps - 5 hold - Latissimus Dorsi Stretch at Wall  - 2-3 x daily - 7 x weekly - 5 reps - 5-10 hold - Supine Lower Trunk Rotation  - 2 x daily - 7 x weekly - 1 sets - 10 reps - 10-20 hold - Shoulder extension with resistance - Neutral  - 1 x daily - 5 x weekly - 1-2 sets - 10 reps - 3 hold - Downward Dog  - 1 x daily - 7 x weekly - 5 reps - 5 hold - Tennis Ball Self Massage on Wall  - 1 x daily - 7 x weekly - 1 sets - 10 reps - Supine Shoulder External Rotation with Resistance  - 1 x daily - 4-5 x weekly - 1 sets - 10 reps - Supine Shoulder Horizontal Abduction with Resistance  - 1 x daily - 4-5 x weekly - 1 sets - 10 reps - Supine narrow and wide grip flexion  - 1 x daily - 4-5 x weekly - 1-2 sets - 10 reps - 3 sec hold - Supine PNF D2   - 1 x daily - 4-5 x weekly - 1-2 sets - 10 reps - 3 sec hold  ASSESSMENT:  CLINICAL IMPRESSION:  Continued soft tissue mobilization, PROM and exercises. Pt requires occasional VC's to depress scapula with exercises. Continued tightness in pectorals, lats and UT.  OBJECTIVE IMPAIRMENTS: decreased ROM, decreased strength, impaired UE functional use, postural dysfunction, and pain.   ACTIVITY LIMITATIONS: sleeping, reach over head, and hygiene/grooming  PARTICIPATION LIMITATIONS:  She participates in all with occasional sharp pains  PERSONAL  FACTORS: 1-2 comorbidities: Left mastectomy with reconstruction and expanders  are also affecting patient's functional outcome.   REHAB POTENTIAL: Good  CLINICAL DECISION MAKING: Stable/uncomplicated  EVALUATION COMPLEXITY: Low  GOALS: Goals reviewed with patient? Yes LONG TERM GOALS: Target date: 08/01/2022  Pt will have left shoulder abduction 150 for  improved reaching and decreased tightness Baseline: 135 Goal status: INITIAL  2.  Pt will have improved ER on left to 88 degrees for decreased pectoral tightness Baseline: 75 Goal status: INITIAL  3.  Pt will improve quick dash to no greater than 15% Baseline: 31.82 Goal status: INITIAL  4.  Pt will have decreased tenderness/sharp pain by atleast 50% Baseline:  Goal status: INITIAL  PLAN:  PT FREQUENCY: 1-2x/week  PT DURATION: 8 weeks  PLANNED INTERVENTIONS: Therapeutic exercises, Neuromuscular re-education, Patient/Family education, Self Care, Joint mobilization, Orthotic/Fit training, Dry Needling, and Manual therapy  PLAN FOR NEXT SESSION: Pec wall stretch, standing lat stretch, LTR, pulleys, STM left upper quarter/cupping, DN if warranted,PROM, progress to strength as tolerated, measure circumference of arms Progress postural exercise.  Monitor left arm for lymphedem, SOZO 6 month set up    Cheral Almas, PT 06/25/22 10:52 AM

## 2022-06-26 ENCOUNTER — Encounter (INDEPENDENT_AMBULATORY_CARE_PROVIDER_SITE_OTHER): Payer: Self-pay

## 2022-06-26 NOTE — Progress Notes (Signed)
[facility] PREP Evaluation  Patient Details  Name: Mackenzie [name] MRN: [id number] Date of Birth: 1970/11/22 Age: 52 y.o. PCP: [name], MD  Vitals:   06/26/22 1905  BP: 130/78  Pulse: 89  SpO2: 98%  Weight: 248 lb (112.5 kg)     [facility] Eval - 06/26/22 1900       [facility] "PREP" Location   [facility] "PREP" Location [facility]      Referral    Program Start Date --   Final date 06/26/22     Measurement   Waist Circumference 52.5 inches   with clothes   Hip Circumference 53.5 inches   measured with clothes   Body fat 47.4 percent      Information for Trainer   Goals Wants to cont to exercise      Mobility and Daily Activities   I find it easy to walk up or down two or more flights of stairs. 1    I have no trouble taking out the trash. 4    I do housework such as vacuuming and dusting on my own without difficulty. 4    I can easily lift a gallon of milk (8lbs). 4    I can easily walk a mile. 4    I have no trouble reaching into high cupboards or reaching down to pick up something from the floor. 2    I do not have trouble doing out-door work such as Armed forces logistics/support/administrative officer, raking leaves, or gardening. 4      Mobility and Daily Activities   I feel younger than my age. 2    I feel independent. 4    I feel energetic. 2    I live an active life.  3    I feel strong. 3    I feel healthy. 3    I feel active as other people my age. 3      How fit and strong are you.   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, left ([place]) 01/23/2017   Edema, lower extremity    Hot flashes 03/2017   Hyperlipidemia 03/20/2016   Morbid obesity with body mass index (BMI) of 40.0 to 49.9 (HCC) 09/17/2017    NCGS (non-celiac gluten sensitivity)    Pre-diabetes 07/26/2018   Hgb A1C elevated on 07/26/2018, Gestational Diabetes 2012   Seasonal allergies 2012   seasonal allergies causes allergic rhinitis and itchy, dry eyes per pt   Vitamin D deficiency 05/2015   Past Surgical History:  Procedure Laterality Date   BREAST RECONSTRUCTION WITH PLACEMENT OF TISSUE EXPANDER AND FLEX HD (ACELLULAR HYDRATED DERMIS) Left 08/27/2017   Procedure: LEFT BREAST RECONSTRUCTION WITH PLACEMENT OF TISSUE EXPANDER AND FLEX HD;  Surgeon: Wallace Going, DO;  Location: [place];  Service: Plastics;  Laterality: Left;   CESAREAN SECTION     x2   LATISSIMUS FLAP TO BREAST Left 11/03/2018   LATISSIMUS FLAP TO BREAST Left 11/03/2018   Procedure: LATISSIMUS FLAP TO LEFT BREAST;  Surgeon: Wallace Going, DO;  Location: [place];  Service: Plastics;  Laterality: Left;   MASTECTOMY MODIFIED  RADICAL Left 08/27/2017   MASTECTOMY MODIFIED RADICAL Left 08/27/2017   Procedure: LEFT MODIFIED RADICAL MASTECTOMY;  Surgeon: Erroll Luna, MD;  Location: [place];  Service: General;  Laterality: Left;   MASTOPEXY Right 03/03/2019   Procedure: RIGHT BREAST MASTOPEXY/REDUCTION;  Surgeon: Wallace Going, DO;  Location: [place];  Service: Plastics;  Laterality: Right;  3 hours, please   PORT-A-CATH REMOVAL Right 08/27/2017   Procedure: REMOVAL PORT-A-CATH RIGHT CHEST;  Surgeon: Erroll Luna, MD;  Location: [place];  Service: General;  Laterality: Right;   PORTACATH PLACEMENT Right 01/28/2017   Procedure: INSERTION PORT-A-CATH WITH ULTRASOUND;  Surgeon: Erroll Luna, MD;  Location: [place];  Service: General;  Laterality: Right;   REDUCTION MAMMAPLASTY Right    REMOVAL OF TISSUE EXPANDER AND PLACEMENT OF IMPLANT Left 03/03/2019   Procedure: REMOVAL OF TISSUE EXPANDER AND PLACEMENT OF EXPANDER;  Surgeon: Wallace Going, DO;  Location: [place];  Service: Plastics;   Laterality: Left;   REMOVAL OF TISSUE EXPANDER AND PLACEMENT OF IMPLANT Left 05/25/2019   Procedure: LEFT BREAST REMOVAL OF TISSUE EXPANDER AND PLACEMENT OF IMPLANT;  Surgeon: Wallace Going, DO;  Location: [place];  Service: Plastics;  Laterality: Left;   TISSUE EXPANDER PLACEMENT Left 11/03/2018   Procedure: PLACEMENT OF TISSUE EXPANDER LEFT BREAST;  Surgeon: Wallace Going, DO;  Location: [place];  Service: Plastics;  Laterality: Left;  Total case time is 3.5 hours   TUBAL LIGATION Bilateral 01/21/2011   Social History   Tobacco Use  Smoking Status Never  Smokeless Tobacco Never  Brainstormed ways to incorporate more exercise in schedule. Plans on keeping [facility] exercise days for the [facility]. Talked about ways to get exercise in while at work during breaktimes and possibly using hosp gym before work.  Attended >19 workouts, and 11 educational sessions Fit testing: Cardio march: 287 to 262 Sit to stand: 8 to 13 Bicep curl: 18 to [place] 06/26/2022, 7:09 PM

## 2022-06-27 ENCOUNTER — Other Ambulatory Visit (HOSPITAL_COMMUNITY): Payer: Self-pay

## 2022-06-27 DIAGNOSIS — H47332 Pseudopapilledema of optic disc, left eye: Secondary | ICD-10-CM | POA: Diagnosis not present

## 2022-06-30 ENCOUNTER — Encounter: Payer: Self-pay | Admitting: *Deleted

## 2022-07-01 ENCOUNTER — Ambulatory Visit: Payer: 59

## 2022-07-01 DIAGNOSIS — M25612 Stiffness of left shoulder, not elsewhere classified: Secondary | ICD-10-CM | POA: Diagnosis not present

## 2022-07-01 DIAGNOSIS — R293 Abnormal posture: Secondary | ICD-10-CM | POA: Diagnosis not present

## 2022-07-01 DIAGNOSIS — R600 Localized edema: Secondary | ICD-10-CM | POA: Diagnosis not present

## 2022-07-01 DIAGNOSIS — Z9889 Other specified postprocedural states: Secondary | ICD-10-CM

## 2022-07-01 DIAGNOSIS — R262 Difficulty in walking, not elsewhere classified: Secondary | ICD-10-CM | POA: Diagnosis not present

## 2022-07-01 DIAGNOSIS — M6281 Muscle weakness (generalized): Secondary | ICD-10-CM | POA: Diagnosis not present

## 2022-07-01 NOTE — Therapy (Signed)
OUTPATIENT PHYSICAL THERAPY ONCOLOGY TREATMENT  Patient Name: Mackenzie [name] MRN: [id number] DOB:Oct 28, 1970, 52 y.o., female Today's Date: 07/01/2022  END OF SESSION:  PT End of Session - 07/01/22 0912     Visit Number 7    Number of Visits 16    Date for PT Re-Evaluation 08/01/22    PT Start Time 0907    PT Stop Time 1001    PT Time Calculation (min) 54 min    Activity Tolerance Patient tolerated treatment well    Behavior During Therapy [facility] for tasks assessed/performed              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   Allergic rhinitis 2012   Anemia 01/27/2017   prior to starting chemotherapy   Breast cancer ([place]) 01/14/2017   Left breast   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, left ([place]) 01/23/2017   Edema, lower extremity    Healthcare maintenance 03/25/2017   Hot flashes 03/2017   Hyperlipidemia 03/20/2016   Morbid obesity with body mass index (BMI) of 40.0 to 49.9 (HCC) 09/17/2017   NCGS (non-celiac gluten sensitivity)    Pre-diabetes 07/26/2018   Hgb A1C elevated on 07/26/2018, Gestational Diabetes 2012   Seasonal allergies 2012   seasonal allergies causes allergic rhinitis and itchy, dry eyes per pt   Vitamin D deficiency 05/2015   Past Surgical History:  Procedure Laterality Date   BREAST RECONSTRUCTION WITH PLACEMENT OF TISSUE EXPANDER AND FLEX HD (ACELLULAR HYDRATED DERMIS) Left 08/27/2017   Procedure: LEFT BREAST RECONSTRUCTION WITH PLACEMENT OF TISSUE EXPANDER AND FLEX HD;  Surgeon: Wallace Going, DO;  Location: [place];  Service: Plastics;  Laterality: Left;   CESAREAN SECTION     x2   LATISSIMUS FLAP TO BREAST Left 11/03/2018   LATISSIMUS FLAP TO BREAST Left 11/03/2018   Procedure: LATISSIMUS FLAP TO LEFT BREAST;  Surgeon: Wallace Going, DO;  Location: [place];  Service: Plastics;  Laterality: Left;   MASTECTOMY MODIFIED RADICAL Left 08/27/2017    MASTECTOMY MODIFIED RADICAL Left 08/27/2017   Procedure: LEFT MODIFIED RADICAL MASTECTOMY;  Surgeon: Erroll Luna, MD;  Location: [place];  Service: General;  Laterality: Left;   MASTOPEXY Right 03/03/2019   Procedure: RIGHT BREAST MASTOPEXY/REDUCTION;  Surgeon: Wallace Going, DO;  Location: [place];  Service: Plastics;  Laterality: Right;  3 hours, please   PORT-A-CATH REMOVAL Right 08/27/2017   Procedure: REMOVAL PORT-A-CATH RIGHT CHEST;  Surgeon: Erroll Luna, MD;  Location: [place];  Service: General;  Laterality: Right;   PORTACATH PLACEMENT Right 01/28/2017   Procedure: INSERTION PORT-A-CATH WITH ULTRASOUND;  Surgeon: Erroll Luna, MD;  Location: [place];  Service: General;  Laterality: Right;   REDUCTION MAMMAPLASTY Right    REMOVAL OF TISSUE EXPANDER AND PLACEMENT OF IMPLANT Left 03/03/2019   Procedure: REMOVAL OF TISSUE EXPANDER AND PLACEMENT OF EXPANDER;  Surgeon: Wallace Going, DO;  Location: [place];  Service: Plastics;  Laterality: Left;   REMOVAL OF TISSUE EXPANDER AND PLACEMENT OF IMPLANT Left 05/25/2019   Procedure: LEFT BREAST REMOVAL OF TISSUE EXPANDER AND PLACEMENT OF IMPLANT;  Surgeon: Wallace Going, DO;  Location: [place];  Service: Plastics;  Laterality: Left;   TISSUE EXPANDER PLACEMENT Left 11/03/2018   Procedure: PLACEMENT OF TISSUE EXPANDER LEFT BREAST;  Surgeon: Wallace Going, DO;  Location: [place];  Service: Plastics;  Laterality:  Left;  Total case time is 3.5 hours   TUBAL LIGATION Bilateral 01/21/2011   Patient Active Problem List   Diagnosis Date Noted   Boil of buttock, Left 12/24/2021   Hyperthyroidism 07/22/2021   Low serum thyroid stimulating hormone (TSH) 06/12/2021   Other fatigue 05/23/2021   Hemoglobin low 05/23/2021   Polyphagia 05/16/2020   Vitamin D deficiency 05/16/2020   At risk for osteoporosis 05/16/2020   Prediabetes 05/01/2020   Elevated blood  pressure reading 05/01/2020   At risk for impaired metabolic function 07/86/7544   Breast asymmetry following reconstructive surgery 09/20/2019   Essential hypertension 08/17/2018   Class 3 severe obesity with serious comorbidity and body mass index (BMI) of 40.0 to 44.9 in adult [facility]) 09/17/2017   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   Malignant neoplasm of upper-inner quadrant of left breast in female, estrogen receptor positive ([place]) 07/27/2017   Healthcare maintenance 03/25/2017   Malignant 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, estrogen receptor positive ([place]) 01/23/2017   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, left ([place]) 01/23/2017    PCP: Horald Pollen, MD  REFERRING PROVIDER: Donnamarie Rossetti PA-C  REFERRING DIAG: Post Mastectomy, Left upper back and UE pain  THERAPY DIAG:  Stiffness of left shoulder, not elsewhere classified  Status post left breast reconstruction  Abnormal posture  ONSET DATE: 11/03/2018 with recent exacerbation  Rationale for Evaluation and Treatment: Rehabilitation  SUBJECTIVE:                                                                                                                                                                                           SUBJECTIVE STATEMENT: I did do the theraband exercises and they went well. I'm feel pretty good today other than just tight in my Lt shoulder and axilla.    PERTINENT HISTORY:  Left breast cancer diagnosed 01/10/17 by mammogram and needle biopsy. Then had neo-adjuvant chemo starting 02/10/17 until 06/26/17; had left mastectomy 08/27/17 with ALND and immediate expander placed. Radiation completed. Expander removed and replaced on May 27, 19 b/c it did not get expanded enough prior to radiation, also had lat flap 11/03/18. Had expander removed and replaced again on 03/03/19 due to a small leak, pt had reduction on R breast at this time   PAIN:  Are you having pain?  No ,not presently  PRECAUTIONS: Other: left UE lymphedema risk  WEIGHT BEARING RESTRICTIONS: No  FALLS:  Has patient fallen in last 6 months? No  LIVING ENVIRONMENT: Lives with: her 54 yr old and 18 yr old daughters Lives in: House/apartment Stairs: No; External: 2 steps; none  Has following equipment at home: Crutches and bed side commode  OCCUPATION: [employer], Financial controller  LEISURE: walk, watch movies, spend time with kids.  HAND DOMINANCE: right   PRIOR LEVEL OF FUNCTION: Independent  PATIENT GOALS: Get rid of sharp pain.   OBJECTIVE:  COGNITION: Overall cognitive status: Within functional limits for tasks assessed   PALPATION: Tender left scapular region, lateral trunk, pectorals, very tight UT  OBSERVATIONS / OTHER ASSESSMENTS: no visible swelling however pt with decreased muscle mass noted  left scapular region. Healed incision from lat flap with mild tenderness mid incision. Mild compensation and increased effort noted with left shoulder AROM  SENSATION: Light touch: Appears intact POSTURE: significant forward head, rounded shoulders  UPPER EXTREMITY AROM/PROM:  A/PROM RIGHT   eval   Shoulder extension 55  Shoulder flexion 140  Shoulder abduction 157  Shoulder internal rotation 70  Shoulder external rotation 95    (Blank rows = not tested)  A/PROM LEFT   eval LEFT 06/19/22  Shoulder extension 45   Shoulder flexion 132 158  Shoulder abduction 135 156  Shoulder internal rotation 54   Shoulder external rotation 75     (Blank rows = not tested)  CERVICAL AROM: All within functional  limits:    UPPER EXTREMITY STRENGTH:  Left grip 40,52,45; avg 45.6 Right grip 50,47,55 avg 50.66   LYMPHEDEMA ASSESSMENTS:   SURGERY TYPE/DATE: Left Modified Radical Mastectomy 08/27/2017, 11/03/2018 Left Lat Flap with tissue expander, 05/25/2019 Removal of expander and placement of implant  NUMBER OF LYMPH NODES REMOVED: 1/4  CHEMOTHERAPY: yes  neoadjuvant  RADIATION:yes  HORMONE TREATMENT: yes, Letrozole  INFECTIONS: NO  LYMPHEDEMA ASSESSMENTS:   LANDMARK RIGHT  eval RIGHT  06/10/2022  10 cm proximal to olecranon process 39.5 39.5  Olecranon process '30 30  10 '$ cm proximal to ulnar styloid process 24 25.9  Just proximal to ulnar styloid process 17.5 17.7  Across hand at thumb web space 20 20  At base of 2nd digit 6.4 6.3  (Blank rows = not tested)  LANDMARK LEFT  eval LEFT 06/10/2022  10 cm proximal to olecranon process 39.5 40  Olecranon process 29.'7 31  10 '$ cm proximal to ulnar styloid process 25.5 26.4  Just proximal to ulnar styloid process 18.7 19  Across hand at thumb web space 19.6 20.7  At base of 2nd digit 6.4 6.1  (Blank rows = not tested)    QUICK DASH SURVEY: 31.82      The patient was assessed using the L-Dex machine today to produce a lymphedema index baseline score. The patient will be reassessed on a regular basis (typically every 3 months) to obtain new L-Dex scores. If the score is > 6.5 points away from his/her baseline score indicating onset of subclinical lymphedema, it will be recommended to wear a compression garment for 4 weeks, 12 hours per day and then be reassessed. If the score continues to be > 6.5 points from baseline at reassessment, we will initiate lymphedema treatment. Assessing in this manner has a 95% rate of preventing clinically significant lymphedema.  TODAY'S TREATMENT:  DATE: 07/01/22: Therapeutic Exercises Pulleys into flexion and abd x2 mins each with VC's to remind pt to decrease Lt scapular compensation Roll yellow ball up wall into flexion x10 and Lt abd x5 Modified downward dog on wall 5x, 5 sec holds Supine over half foam roll: Bil UE's into horz abd x10 and then bil scaption into a "V" 2 x 5 as pt reports UE's beginning to feel  fatigued Manual Therapy STM to left pectorals, lats, UT and in SL to scapular region and lats in Rt S/L with cocoa butter PROM left shoulder flex, abd and D2 with scapular depression   06/25/2022 STM to left pectorals, lats, UT and in SL to scapular region and lats. PROM left shoulder flex, scaption, ER Supine wand flexion and scaption x 5 Supine AROM bilateral shoulder flexion, scaption, horizontal abd x 5 Reviewed supine scapular strengthening exercises for all with yellow band x 10VC's to keep scapula engaged Pt set up 6 month SOZO 06/19/22: Circumference measurements taken today and SOZO completed, ROM also assessed Therapeutic Exercises Pulleys into flexion and abd x2 mins each with VCs to relax shoulders Ball roll up wall into Lt UE abd and flexion x10 each returning therapist demo Instructed pt in supine scapular strengthening series: narrow and wide grip, horizontal abduction, ER, diagonals with pt returning therapist demo for all x 10 reps with yellow band - required v/c to keep scapula engaged throughout to avoid impingement like pain- added to HEP   Home exercise and patient education.  Lower trunk rotation : lie on your back with goal post arms and take from side to side Standing Rows: Theraband around doorknob, posture tall with back of head to ceiling, Reach both hands back to hips pinching shoulder blades together and reaching chest forward        06/06/2022  PATIENT EDUCATION:  Education details: Access Code: [email or web address] URL: [email or web address] Date: 06/06/2022 Prepared by: Cheral Almas  Exercises - Supine Shoulder Flexion AAROM with Hands Clasped  - 2 x daily - 7 x weekly - 1 sets - 5 reps - 5 hold - Supine Chest Stretch with Arms Behind Head  - 2 x daily - 7 x weekly - 1 sets - 5 reps - 5 hold - Standing Shoulder Abduction Wall Slide with Thumb Out  - 2 x daily - 7 x weekly - 1 sets - 5 reps - 5 hold -07/01/22: Added supine on ball for bil horz  abd and bil UE scaption into a "V" Person educated: Patient Education method: Explanation, Demonstration, and Handouts Education comprehension: verbalized understanding and returned demonstration  HOME EXERCISE PROGRAM:  Supine Shoulder Flexion AAROM with Hands Clasped  - 2 x daily - 7 x weekly - 1 sets - 5 reps - 5 hold - Supine Chest Stretch with Arms Behind Head  - 2 x daily - 7 x weekly - 1 sets - 5 reps - 5 hold - Standing Shoulder Abduction Wall Slide with Thumb Out  - 2 x daily - 7 x weekly - 1 sets - 5 reps - 5 hold Person educated: Patient Access Code: [email or web address] URL: [email or web address] Date: 06/12/2022 Prepared by: Collie Siad  Exercises Access Code: [email or web address] URL: [email or web address] Date: 06/19/2022 Prepared by: Manus Gunning  Exercises - Supine Shoulder Flexion AAROM with Hands Clasped  - 2 x daily - 7 x weekly - 1 sets - 5 reps - 5 hold - Supine Chest Stretch with Arms Behind Head  - 2 x daily - 7 x weekly -  1 sets - 5 reps - 5 hold - Standing Shoulder Abduction Wall Slide with Thumb Out  - 2 x daily - 7 x weekly - 1 sets - 5 reps - 5 hold - Latissimus Dorsi Stretch at Wall  - 2-3 x daily - 7 x weekly - 5 reps - 5-10 hold - Supine Lower Trunk Rotation  - 2 x daily - 7 x weekly - 1 sets - 10 reps - 10-20 hold - Shoulder extension with resistance - Neutral  - 1 x daily - 5 x weekly - 1-2 sets - 10 reps - 3 hold - Downward Dog  - 1 x daily - 7 x weekly - 5 reps - 5 hold - Tennis Ball Self Massage on Wall  - 1 x daily - 7 x weekly - 1 sets - 10 reps - Supine Shoulder External Rotation with Resistance  - 1 x daily - 4-5 x weekly - 1 sets - 10 reps - Supine Shoulder Horizontal Abduction with Resistance  - 1 x daily - 4-5 x weekly - 1 sets - 10 reps - Supine narrow and wide grip flexion  - 1 x daily - 4-5 x weekly - 1-2 sets - 10 reps - 3 sec hold - Supine PNF D2   - 1 x daily - 4-5 x weekly - 1-2 sets - 10 reps - 3 sec  hold  ASSESSMENT:  CLINICAL IMPRESSION:  Continued with AA/ROM stretches of her Lt upper quadrant and also on half foam roll for stretch to back with UE stretches. Pt reports having a ball at home she could do these on so added to HEP. Then continued with manual therapy working to decrease Lt upper quadrant tightness. She is still palpably tight at Lt paraspinal muscles medial to incision.   OBJECTIVE IMPAIRMENTS: decreased ROM, decreased strength, impaired UE functional use, postural dysfunction, and pain.   ACTIVITY LIMITATIONS: sleeping, reach over head, and hygiene/grooming  PARTICIPATION LIMITATIONS:  She participates in all with occasional sharp pains  PERSONAL FACTORS: 1-2 comorbidities: Left mastectomy with reconstruction and expanders  are also affecting patient's functional outcome.   REHAB POTENTIAL: Good  CLINICAL DECISION MAKING: Stable/uncomplicated  EVALUATION COMPLEXITY: Low  GOALS: Goals reviewed with patient? Yes LONG TERM GOALS: Target date: 08/01/2022  Pt will have left shoulder abduction 150 for improved reaching and decreased tightness Baseline: 135 Goal status: INITIAL  2.  Pt will have improved ER on left to 88 degrees for decreased pectoral tightness Baseline: 75 Goal status: INITIAL  3.  Pt will improve quick dash to no greater than 15% Baseline: 31.82 Goal status: INITIAL  4.  Pt will have decreased tenderness/sharp pain by atleast 50% Baseline:  Goal status: INITIAL  PLAN:  PT FREQUENCY: 1-2x/week  PT DURATION: 8 weeks  PLANNED INTERVENTIONS: Therapeutic exercises, Neuromuscular re-education, Patient/Family education, Self Care, Joint mobilization, Orthotic/Fit training, Dry Needling, and Manual therapy  PLAN FOR NEXT SESSION: Pec wall stretch, standing lat stretch, LTR, pulleys, STM left upper quarter/cupping, DN if warranted,PROM, progress to strength as tolerated, measure circumference of arms Progress postural exercise.  Monitor left arm  for lymphedem, SOZO 6 month set up    Collie Siad, PTA 07/01/22 10:07 AM

## 2022-07-02 ENCOUNTER — Telehealth: Payer: Self-pay | Admitting: *Deleted

## 2022-07-02 NOTE — Telephone Encounter (Signed)
Mackenzie [name]: Dr. Chryl Heck reviewed patient's hormone levels since holding goserelin and considers patient post-menopausal for over a year, since at least November 23, 2020.  Dr. Chryl Heck states no need to continue to monitor hormone levels and okay to cancel lab appointment scheduled in February.  Okay to permanently discontinue Goserelin injections which have been on hold since last injection October 25th 2021.    Notified patient of above and reminded to bring completed medication diaries with her on next research visit 07/21/22 at 9:15 am.  She verbalized understanding.  Foye Spurling, BSN, RN, [place] [facility] [phone] 07/02/2022 10:26 AM

## 2022-07-03 ENCOUNTER — Ambulatory Visit: Payer: 59

## 2022-07-03 DIAGNOSIS — M25612 Stiffness of left shoulder, not elsewhere classified: Secondary | ICD-10-CM

## 2022-07-03 DIAGNOSIS — Z9889 Other specified postprocedural states: Secondary | ICD-10-CM

## 2022-07-03 DIAGNOSIS — R293 Abnormal posture: Secondary | ICD-10-CM | POA: Diagnosis not present

## 2022-07-03 DIAGNOSIS — R600 Localized edema: Secondary | ICD-10-CM | POA: Diagnosis not present

## 2022-07-03 DIAGNOSIS — R262 Difficulty in walking, not elsewhere classified: Secondary | ICD-10-CM | POA: Diagnosis not present

## 2022-07-03 DIAGNOSIS — M6281 Muscle weakness (generalized): Secondary | ICD-10-CM | POA: Diagnosis not present

## 2022-07-03 NOTE — Therapy (Signed)
OUTPATIENT PHYSICAL THERAPY ONCOLOGY TREATMENT  Patient Name: Mackenzie [name] MRN: [id number] DOB:03/11/1971, 52 y.o., female Today's Date: 07/03/2022  END OF SESSION:  PT End of Session - 07/03/22 0912     Visit Number 8    Number of Visits 16    Date for PT Re-Evaluation 08/01/22    PT Start Time 0916   late   PT Stop Time 0952    PT Time Calculation (min) 36 min    Activity Tolerance Patient tolerated treatment well    Behavior During Therapy [facility] for tasks assessed/performed              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   Allergic rhinitis 2012   Anemia 01/27/2017   prior to starting chemotherapy   Breast cancer ([place]) 01/14/2017   Left breast   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, left ([place]) 01/23/2017   Edema, lower extremity    Healthcare maintenance 03/25/2017   Hot flashes 03/2017   Hyperlipidemia 03/20/2016   Morbid obesity with body mass index (BMI) of 40.0 to 49.9 (HCC) 09/17/2017   NCGS (non-celiac gluten sensitivity)    Pre-diabetes 07/26/2018   Hgb A1C elevated on 07/26/2018, Gestational Diabetes 2012   Seasonal allergies 2012   seasonal allergies causes allergic rhinitis and itchy, dry eyes per pt   Vitamin D deficiency 05/2015   Past Surgical History:  Procedure Laterality Date   BREAST RECONSTRUCTION WITH PLACEMENT OF TISSUE EXPANDER AND FLEX HD (ACELLULAR HYDRATED DERMIS) Left 08/27/2017   Procedure: LEFT BREAST RECONSTRUCTION WITH PLACEMENT OF TISSUE EXPANDER AND FLEX HD;  Surgeon: Wallace Going, DO;  Location: [place];  Service: Plastics;  Laterality: Left;   CESAREAN SECTION     x2   LATISSIMUS FLAP TO BREAST Left 11/03/2018   LATISSIMUS FLAP TO BREAST Left 11/03/2018   Procedure: LATISSIMUS FLAP TO LEFT BREAST;  Surgeon: Wallace Going, DO;  Location: [place];  Service: Plastics;  Laterality: Left;   MASTECTOMY MODIFIED RADICAL Left  08/27/2017   MASTECTOMY MODIFIED RADICAL Left 08/27/2017   Procedure: LEFT MODIFIED RADICAL MASTECTOMY;  Surgeon: Erroll Luna, MD;  Location: [place];  Service: General;  Laterality: Left;   MASTOPEXY Right 03/03/2019   Procedure: RIGHT BREAST MASTOPEXY/REDUCTION;  Surgeon: Wallace Going, DO;  Location: [place];  Service: Plastics;  Laterality: Right;  3 hours, please   PORT-A-CATH REMOVAL Right 08/27/2017   Procedure: REMOVAL PORT-A-CATH RIGHT CHEST;  Surgeon: Erroll Luna, MD;  Location: [place];  Service: General;  Laterality: Right;   PORTACATH PLACEMENT Right 01/28/2017   Procedure: INSERTION PORT-A-CATH WITH ULTRASOUND;  Surgeon: Erroll Luna, MD;  Location: [place];  Service: General;  Laterality: Right;   REDUCTION MAMMAPLASTY Right    REMOVAL OF TISSUE EXPANDER AND PLACEMENT OF IMPLANT Left 03/03/2019   Procedure: REMOVAL OF TISSUE EXPANDER AND PLACEMENT OF EXPANDER;  Surgeon: Wallace Going, DO;  Location: [place];  Service: Plastics;  Laterality: Left;   REMOVAL OF TISSUE EXPANDER AND PLACEMENT OF IMPLANT Left 05/25/2019   Procedure: LEFT BREAST REMOVAL OF TISSUE EXPANDER AND PLACEMENT OF IMPLANT;  Surgeon: Wallace Going, DO;  Location: [place];  Service: Plastics;  Laterality: Left;   TISSUE EXPANDER PLACEMENT Left 11/03/2018   Procedure: PLACEMENT OF TISSUE EXPANDER LEFT BREAST;  Surgeon: Wallace Going, DO;  Location: [place];  Service: Plastics;  Laterality: Left;  Total case time is 3.5 hours   TUBAL LIGATION Bilateral 01/21/2011   Patient Active Problem List   Diagnosis Date Noted   Boil of buttock, Left 12/24/2021   Hyperthyroidism 07/22/2021   Low serum thyroid stimulating hormone (TSH) 06/12/2021   Other fatigue 05/23/2021   Hemoglobin low 05/23/2021   Polyphagia 05/16/2020   Vitamin D deficiency 05/16/2020   At risk for osteoporosis 05/16/2020   Prediabetes 05/01/2020    Elevated blood pressure reading 05/01/2020   At risk for impaired metabolic function 28/31/5176   Breast asymmetry following reconstructive surgery 09/20/2019   Essential hypertension 08/17/2018   Class 3 severe obesity with serious comorbidity and body mass index (BMI) of 40.0 to 44.9 in adult [facility]) 09/17/2017   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   Malignant neoplasm of upper-inner quadrant of left breast in female, estrogen receptor positive ([place]) 07/27/2017   Healthcare maintenance 03/25/2017   Malignant 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, estrogen receptor positive ([place]) 01/23/2017   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, left ([place]) 01/23/2017    PCP: Horald Pollen, MD  REFERRING PROVIDER: Donnamarie Rossetti PA-C  REFERRING DIAG: Post Mastectomy, Left upper back and UE pain  THERAPY DIAG:  Stiffness of left shoulder, not elsewhere classified  Status post left breast reconstruction  Abnormal posture  ONSET DATE: 11/03/2018 with recent exacerbation  Rationale for Evaluation and Treatment: Rehabilitation  SUBJECTIVE:                                                                                                                                                                                           SUBJECTIVE STATEMENT:  Sharp pain in my back is doing well, but get intermittent pain about 1x per day under my left breast most noticeable with bending over.  PERTINENT HISTORY:  Left breast cancer diagnosed 01/10/17 by mammogram and needle biopsy. Then had neo-adjuvant chemo starting 02/10/17 until 06/26/17; had left mastectomy 08/27/17 with ALND and immediate expander placed. Radiation completed. Expander removed and replaced on May 27, 19 b/c it did not get expanded enough prior to radiation, also had lat flap 11/03/18. Had expander removed and replaced again on 03/03/19 due to a small leak, pt had reduction on R breast at this time   PAIN:   Are you having pain? No ,not presently  PRECAUTIONS: Other: left UE lymphedema risk  WEIGHT BEARING RESTRICTIONS: No  FALLS:  Has patient fallen in last 6 months? No  LIVING ENVIRONMENT: Lives with: her 54 yr old and 71 yr old daughters Lives in: House/apartment Stairs: No; External: 2 steps; none  Has following equipment at home: Crutches and bed side commode  OCCUPATION: [employer], [employer]  LEISURE: walk, watch movies, spend time with kids.  HAND DOMINANCE: right   PRIOR LEVEL OF FUNCTION: Independent  PATIENT GOALS: Get rid of sharp pain.   OBJECTIVE:  COGNITION: Overall cognitive status: Within functional limits for tasks assessed   PALPATION: Tender left scapular region, lateral trunk, pectorals, very tight UT  OBSERVATIONS / OTHER ASSESSMENTS: no visible swelling however pt with decreased muscle mass noted  left scapular region. Healed incision from lat flap with mild tenderness mid incision. Mild compensation and increased effort noted with left shoulder AROM  SENSATION: Light touch: Appears intact POSTURE: significant forward head, rounded shoulders  UPPER EXTREMITY AROM/PROM:  A/PROM RIGHT   eval   Shoulder extension 55  Shoulder flexion 140  Shoulder abduction 157  Shoulder internal rotation 70  Shoulder external rotation 95    (Blank rows = not tested)  A/PROM LEFT   eval LEFT 06/19/22 LEFT 07/03/2022  Shoulder extension 45    Shoulder flexion 132 158 152  Shoulder abduction 135 156 155  Shoulder internal rotation 54    Shoulder external rotation 75      (Blank rows = not tested)  CERVICAL AROM: All within functional  limits:    UPPER EXTREMITY STRENGTH:  Left grip 40,52,45; avg 45.6 Right grip 50,47,55 avg 50.66   LYMPHEDEMA ASSESSMENTS:   SURGERY TYPE/DATE: Left Modified Radical Mastectomy 08/27/2017, 11/03/2018 Left Lat Flap with tissue expander, 05/25/2019 Removal of expander and placement of implant  NUMBER OF LYMPH  NODES REMOVED: 1/4  CHEMOTHERAPY: yes neoadjuvant  RADIATION:yes  HORMONE TREATMENT: yes, Letrozole  INFECTIONS: NO  LYMPHEDEMA ASSESSMENTS:   LANDMARK RIGHT  eval RIGHT  06/10/2022  10 cm proximal to olecranon process 39.5 39.5  Olecranon process '30 30  10 '$ cm proximal to ulnar styloid process 24 25.9  Just proximal to ulnar styloid process 17.5 17.7  Across hand at thumb web space 20 20  At base of 2nd digit 6.4 6.3  (Blank rows = not tested)  LANDMARK LEFT  eval LEFT 06/10/2022  10 cm proximal to olecranon process 39.5 40  Olecranon process 29.'7 31  10 '$ cm proximal to ulnar styloid process 25.5 26.4  Just proximal to ulnar styloid process 18.7 19  Across hand at thumb web space 19.6 20.7  At base of 2nd digit 6.4 6.1  (Blank rows = not tested)    QUICK DASH SURVEY: 31.82      The patient was assessed using the L-Dex machine today to produce a lymphedema index baseline score. The patient will be reassessed on a regular basis (typically every 3 months) to obtain new L-Dex scores. If the score is > 6.5 points away from his/her baseline score indicating onset of subclinical lymphedema, it will be recommended to wear a compression garment for 4 weeks, 12 hours per day and then be reassessed. If the score continues to be > 6.5 points from baseline at reassessment, we will initiate lymphedema treatment. Assessing in this manner has a 95% rate of preventing clinically significant lymphedema.  TODAY'S TREATMENT:  DATE:  07/03/2022 Overhead pulleys x 1 min flexion and abduction 4 D ball ball rolls x 10 ea Supine on Half foam roll: Bilateral AROM flexion, scaption and horizontal abd x 5 STM to left pectorals, lats, UT, and left intercostals and in SL to scapular region and lats in Rt S/L with cocoa butter PROM left shoulder flex, abd and D2 with  scapular depression     07/01/22: Therapeutic Exercises Pulleys into flexion and abd x2 mins each with VC's to remind pt to decrease Lt scapular compensation Roll yellow ball up wall into flexion x10 and Lt abd x5 Modified downward dog on wall 5x, 5 sec holds Supine over half foam roll: Bil UE's into horz abd x10 and then bil scaption into a "V" 2 x 5 as pt reports UE's beginning to feel fatigued Manual Therapy STM to left pectorals, lats, UT and in SL to scapular region and lats in Rt S/L with cocoa butter PROM left shoulder flex, abd and D2 with scapular depression   06/25/2022 STM to left pectorals, lats, UT and in SL to scapular region and lats. PROM left shoulder flex, scaption, ER Supine wand flexion and scaption x 5 Supine AROM bilateral shoulder flexion, scaption, horizontal abd x 5 Reviewed supine scapular strengthening exercises for all with yellow band x 10VC's to keep scapula engaged Pt set up 6 month SOZO 06/19/22: Circumference measurements taken today and SOZO completed, ROM also assessed Therapeutic Exercises Pulleys into flexion and abd x2 mins each with VCs to relax shoulders Ball roll up wall into Lt UE abd and flexion x10 each returning therapist demo Instructed pt in supine scapular strengthening series: narrow and wide grip, horizontal abduction, ER, diagonals with pt returning therapist demo for all x 10 reps with yellow band - required v/c to keep scapula engaged throughout to avoid impingement like pain- added to HEP   Home exercise and patient education.  Lower trunk rotation : lie on your back with goal post arms and take from side to side Standing Rows: Theraband around doorknob, posture tall with back of head to ceiling, Reach both hands back to hips pinching shoulder blades together and reaching chest forward        06/06/2022  PATIENT EDUCATION:  Education details: Access Code: [email or web address] URL: [email or web address] Date:  06/06/2022 Prepared by: Cheral Almas  Exercises - Supine Shoulder Flexion AAROM with Hands Clasped  - 2 x daily - 7 x weekly - 1 sets - 5 reps - 5 hold - Supine Chest Stretch with Arms Behind Head  - 2 x daily - 7 x weekly - 1 sets - 5 reps - 5 hold - Standing Shoulder Abduction Wall Slide with Thumb Out  - 2 x daily - 7 x weekly - 1 sets - 5 reps - 5 hold -07/01/22: Added supine on ball for bil horz abd and bil UE scaption into a "V" Person educated: Patient Education method: Explanation, Demonstration, and Handouts Education comprehension: verbalized understanding and returned demonstration  HOME EXERCISE PROGRAM:  Supine Shoulder Flexion AAROM with Hands Clasped  - 2 x daily - 7 x weekly - 1 sets - 5 reps - 5 hold - Supine Chest Stretch with Arms Behind Head  - 2 x daily - 7 x weekly - 1 sets - 5 reps - 5 hold - Standing Shoulder Abduction Wall Slide with Thumb Out  - 2 x daily - 7 x weekly - 1 sets - 5 reps - 5 hold Person educated: Patient Access  Code: [email or web address] URL: [email or web address] Date: 06/12/2022 Prepared by: Collie Siad  Exercises Access Code: [email or web address] URL: [email or web address] Date: 06/19/2022 Prepared by: Manus Gunning  Exercises - Supine Shoulder Flexion AAROM with Hands Clasped  - 2 x daily - 7 x weekly - 1 sets - 5 reps - 5 hold - Supine Chest Stretch with Arms Behind Head  - 2 x daily - 7 x weekly - 1 sets - 5 reps - 5 hold - Standing Shoulder Abduction Wall Slide with Thumb Out  - 2 x daily - 7 x weekly - 1 sets - 5 reps - 5 hold - Latissimus Dorsi Stretch at Wall  - 2-3 x daily - 7 x weekly - 5 reps - 5-10 hold - Supine Lower Trunk Rotation  - 2 x daily - 7 x weekly - 1 sets - 10 reps - 10-20 hold - Shoulder extension with resistance - Neutral  - 1 x daily - 5 x weekly - 1-2 sets - 10 reps - 3 hold - Downward Dog  - 1 x daily - 7 x weekly - 5 reps - 5 hold - Tennis Ball Self Massage on Wall  - 1 x daily - 7 x  weekly - 1 sets - 10 reps - Supine Shoulder External Rotation with Resistance  - 1 x daily - 4-5 x weekly - 1 sets - 10 reps - Supine Shoulder Horizontal Abduction with Resistance  - 1 x daily - 4-5 x weekly - 1 sets - 10 reps - Supine narrow and wide grip flexion  - 1 x daily - 4-5 x weekly - 1-2 sets - 10 reps - 3 sec hold - Supine PNF D2   - 1 x daily - 4-5 x weekly - 1-2 sets - 10 reps - 3 sec hold  ASSESSMENT:  CLINICAL IMPRESSION:   Treatment decreased secondary to pt. Late.  Increased tissue tension noted in left intercostals and left pectorals today with multiple shortened areas.  Measuredments today not as good as last measurements possibly due to decreased treatment time. Pt fatigued very quickly with 4 D ball rolls. OBJECTIVE IMPAIRMENTS: decreased ROM, decreased strength, impaired UE functional use, postural dysfunction, and pain.   ACTIVITY LIMITATIONS: sleeping, reach over head, and hygiene/grooming  PARTICIPATION LIMITATIONS:  She participates in all with occasional sharp pains  PERSONAL FACTORS: 1-2 comorbidities: Left mastectomy with reconstruction and expanders  are also affecting patient's functional outcome.   REHAB POTENTIAL: Good  CLINICAL DECISION MAKING: Stable/uncomplicated  EVALUATION COMPLEXITY: Low  GOALS: Goals reviewed with patient? Yes LONG TERM GOALS: Target date: 08/01/2022  Pt will have left shoulder abduction 150 for improved reaching and decreased tightness Baseline: 135 Goal status: INITIAL  2.  Pt will have improved ER on left to 88 degrees for decreased pectoral tightness Baseline: 75 Goal status: INITIAL  3.  Pt will improve quick dash to no greater than 15% Baseline: 31.82 Goal status: INITIAL  4.  Pt will have decreased tenderness/sharp pain by atleast 50% Baseline:  Goal status: INITIAL  PLAN:  PT FREQUENCY: 1-2x/week  PT DURATION: 8 weeks  PLANNED INTERVENTIONS: Therapeutic exercises, Neuromuscular re-education,  Patient/Family education, Self Care, Joint mobilization, Orthotic/Fit training, Dry Needling, and Manual therapy  PLAN FOR NEXT SESSION: Pec wall stretch, standing lat stretch, LTR, pulleys, STM left upper quarter/cupping, DN if warranted,PROM, progress to strength as tolerated, measure circumference of arms Progress postural exercise.  Monitor left arm for lymphedem, SOZO 6 month set up    Collie Siad, PTA  07/03/22 9:54 AM

## 2022-07-04 ENCOUNTER — Telehealth: Payer: Commercial Managed Care - PPO | Admitting: Student

## 2022-07-06 NOTE — Progress Notes (Unsigned)
[facility]  PATIENT: Mackenzie [name] DOB: 1971-02-09  REFERRING DOCTOR OR PCP: Mackenzie [name], optometry; Mackenzie Pollen, MD (PCP SOURCE: Patient, notes from Dr. Sherral [name], imaging and lab reports, MRI images from 2018 personally reviewed.  _________________________________   HISTORICAL  CHIEF COMPLAINT:  No chief complaint on file.   HISTORY OF PRESENT ILLNESS:  ***  She is a 52 year old woman with a history of breast cancer who presents with papilledema on the right.  I personally reviewed the MRI of the brain from 02/05/2017.  The sella turcica is slightly enlarged.  The study was otherwise normal for age with just a couple punctate T2/FLAIR hyperintense foci in the subcortical white matter of the frontal lobes.  The orbits appear to be normal.  REVIEW OF SYSTEMS: Constitutional: No fevers, chills, sweats, or change in appetite Eyes: No visual changes, double vision, eye pain Ear, nose and throat: No hearing loss, ear pain, nasal congestion, sore throat Cardiovascular: No chest pain, palpitations Respiratory:  No shortness of breath at rest or with exertion.   No wheezes GastrointestinaI: No nausea, vomiting, diarrhea, abdominal pain, fecal incontinence Genitourinary:  No dysuria, urinary retention or frequency.  No nocturia. Musculoskeletal:  No neck pain, back pain Integumentary: No rash, pruritus, skin lesions Neurological: as above Psychiatric: No depression at this time.  No anxiety Endocrine: No palpitations, diaphoresis, change in appetite, change in weigh or increased thirst Hematologic/Lymphatic:  No anemia, purpura, petechiae. Allergic/Immunologic: No itchy/runny eyes, nasal congestion, recent allergic reactions, rashes  ALLERGIES: Allergies  Allergen Reactions   Dilaudid [Hydromorphone] Itching    HOME MEDICATIONS:  Current Outpatient Medications:    acetaminophen (TYLENOL) 500 MG tablet, Take 1,000 mg by mouth every 6 (six) hours as  needed (for pain/headaches.). , Disp: , Rfl:    Ascorbic Acid (VITAMIN C PO), Take by mouth daily., Disp: , Rfl:    Blood Pressure Monitoring (OMRON 3 SERIES BP MONITOR) DEVI, Use as directed, Disp: 1 each, Rfl: 0   calcium carbonate (TUMS - DOSED IN MG ELEMENTAL CALCIUM) 500 MG chewable tablet, Chew 1 tablet by mouth 2 (two) times daily., Disp: , Rfl:    celecoxib (CELEBREX) 200 MG capsule, Take 1 capsule (200 mg total) by mouth 2 (two) times daily. (Patient taking differently: Take 200 mg by mouth daily.), Disp: 30 capsule, Rfl: 3   cetirizine (ZYRTEC) 10 MG tablet, Take 10 mg by mouth daily., Disp: , Rfl:    cyclobenzaprine (FLEXERIL) 10 MG tablet, Take 0.5 tablets (5 mg total) by mouth 2 (two) times daily as needed., Disp: 30 tablet, Rfl: 0   gabapentin (NEURONTIN) 300 MG capsule, Take 1 capsule (300 mg total) by mouth 2 (two) times daily., Disp: 180 capsule, Rfl: 0   Insulin Pen Needle 32G X 4 MM MISC, Use daily., Disp: 100 each, Rfl: 1   letrozole (FEMARA) 2.5 MG tablet, Take 1 tablet (2.5 mg total) by mouth daily., Disp: 90 tablet, Rfl: 4   Liraglutide -Weight Management (SAXENDA) 18 MG/3ML SOPN, Inject 1.8 mg into the skin daily., Disp: 9 mL, Rfl: 0   loratadine (CLARITIN) 10 MG tablet, Take 1 tablet (10 mg total) by mouth daily. (Patient taking differently: Take 10 mg by mouth daily as needed for allergies.), Disp: 90 tablet, Rfl: 2   losartan (COZAAR) 50 MG tablet, Take 1 tablet (50 mg total) by mouth daily., Disp: 90 tablet, Rfl: 0   metFORMIN (GLUCOPHAGE) 500 MG tablet, Take 1 tablet (500 mg total) by mouth 2 (two) times daily  with a meal., Disp: 180 tablet, Rfl: 0   methimazole (TAPAZOLE) 5 MG tablet, Take 2 tablets (10 mg total) by mouth daily., Disp: 180 tablet, Rfl: 3   metoprolol succinate (TOPROL-XL) 50 MG 24 hr tablet, Take 1 tablet (50 mg total) by mouth daily., Disp: 90 tablet, Rfl: 0   Minoxidil (ROGAINE MENS) 5 % FOAM, apply twice daily as directed, Disp: 180 g, Rfl: 0    montelukast (SINGULAIR) 10 MG tablet, Take 1 tablet (10 mg total) by mouth at bedtime., Disp: 90 tablet, Rfl: 0   Multiple Vitamin (MULTIVITAMIN WITH MINERALS) TABS tablet, Take 1 tablet by mouth daily. , Disp: , Rfl:    Omega-3 Fatty Acids (FISH OIL) 1000 MG CAPS, Take 1,000 mg by mouth 2 (two) times daily. , Disp: , Rfl:    omeprazole (PRILOSEC) 40 MG capsule, Take 1 capsule (40 mg total) by mouth daily., Disp: 90 capsule, Rfl: 1   Probiotic Product (PROBIOTIC DAILY PO), Take 1 capsule by mouth 2 (two) times daily. , Disp: , Rfl:    rosuvastatin (CRESTOR) 10 MG tablet, Take 1 tablet (10 mg total) by mouth daily., Disp: 90 tablet, Rfl: 0   spironolactone (ALDACTONE) 25 MG tablet, Take 1 tablet (25 mg total) by mouth daily., Disp: 90 tablet, Rfl: 0   ZINC OXIDE PO, Take 1 tablet by mouth 2 (two) times a day., Disp: , Rfl:  No current facility-administered medications for this visit.  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, left ([place]) 01/23/2017   Edema, lower extremity    Healthcare maintenance 03/25/2017   Hot flashes 03/2017   Hyperlipidemia 03/20/2016   Morbid obesity with body mass index (BMI) of 40.0 to 49.9 ([place]) 09/17/2017   NCGS (non-celiac gluten sensitivity)    Pre-diabetes 07/26/2018   Hgb A1C elevated on 07/26/2018, Gestational Diabetes 2012   Seasonal allergies 2012   seasonal allergies causes allergic rhinitis and itchy, dry eyes per pt   Vitamin D deficiency 05/2015    PAST SURGICAL HISTORY: Past Surgical History:   Procedure Laterality Date   BREAST RECONSTRUCTION WITH PLACEMENT OF TISSUE EXPANDER AND FLEX HD (ACELLULAR HYDRATED DERMIS) Left 08/27/2017   Procedure: LEFT BREAST RECONSTRUCTION WITH PLACEMENT OF TISSUE EXPANDER AND FLEX HD;  Surgeon: Wallace Going, DO;  Location: [facility];  Service: Plastics;  Laterality: Left;   CESAREAN SECTION     x2   LATISSIMUS FLAP TO BREAST Left 11/03/2018   LATISSIMUS FLAP TO BREAST Left 11/03/2018   Procedure: LATISSIMUS FLAP TO LEFT BREAST;  Surgeon: Wallace Going, DO;  Location: [facility];  Service: Plastics;  Laterality: Left;   MASTECTOMY MODIFIED RADICAL Left 08/27/2017   MASTECTOMY MODIFIED RADICAL Left 08/27/2017   Procedure: LEFT MODIFIED RADICAL MASTECTOMY;  Surgeon: Erroll Luna, MD;  Location: [place];  Service: General;  Laterality: Left;   MASTOPEXY Right 03/03/2019   Procedure: RIGHT BREAST MASTOPEXY/REDUCTION;  Surgeon: Wallace Going, DO;  Location: [facility];  Service: Plastics;  Laterality: Right;  3 hours, please  PORT-A-CATH REMOVAL Right 08/27/2017   Procedure: REMOVAL PORT-A-CATH RIGHT CHEST;  Surgeon: Erroll Luna, MD;  Location: [facility];  Service: General;  Laterality: Right;   PORTACATH PLACEMENT Right 01/28/2017   Procedure: INSERTION PORT-A-CATH WITH ULTRASOUND;  Surgeon: Erroll Luna, MD;  Location: [facility];  Service: General;  Laterality: Right;   REDUCTION MAMMAPLASTY Right    REMOVAL OF TISSUE EXPANDER AND PLACEMENT OF IMPLANT Left 03/03/2019   Procedure: REMOVAL OF TISSUE EXPANDER AND PLACEMENT OF EXPANDER;  Surgeon: Wallace Going, DO;  Location: [facility];  Service: Plastics;  Laterality: Left;   REMOVAL OF TISSUE EXPANDER AND PLACEMENT OF IMPLANT Left 05/25/2019   Procedure: LEFT BREAST REMOVAL OF TISSUE EXPANDER AND PLACEMENT OF IMPLANT;  Surgeon: Wallace Going, DO;  Location: [facility];  Service: Plastics;  Laterality: Left;   TISSUE  EXPANDER PLACEMENT Left 11/03/2018   Procedure: PLACEMENT OF TISSUE EXPANDER LEFT BREAST;  Surgeon: Wallace Going, DO;  Location: [facility];  Service: Plastics;  Laterality: Left;  Total case time is 3.5 hours   TUBAL LIGATION Bilateral 01/21/2011    FAMILY HISTORY: Family History  Problem Relation Age of Onset   Other Mother        Y.70 from complications of surgery to remove brain tumor   Diabetes Mother    Hypertension Mother    Stroke Mother    Kidney disease Father    Breast cancer Paternal Grandmother 33       d.60s from breast cancer. Did not have treatment.   Hypertension Other    Diabetes Other    Stroke Other     SOCIAL HISTORY: Social History   Socioeconomic History   Marital status: Legally Separated    Spouse name: Not on file   Number of children: 4   Years of education: Not on file   Highest education level: Not on file  Occupational History   Occupation: RN  Tobacco Use   Smoking status: Never   Smokeless tobacco: Never  Vaping Use   Vaping Use: Never used  Substance and Sexual Activity   Alcohol use: Yes    Comment: social   Drug use: No   Sexual activity: Not on file    Comment: BTL  Other Topics Concern   Not on file  Social History Narrative   Not on file   Social Determinants of Health   Financial Resource Strain: Not on file  Food Insecurity: Not on file  Transportation Needs: Not on file  Physical Activity: Not on file  Stress: Not on file  Social Connections: Not on file  Intimate Partner Violence: Not At Risk (03/01/2018)   Humiliation, Afraid, Rape, and Kick questionnaire    Fear of Current or Ex-Partner: No    Emotionally Abused: No    Physically Abused: No    Sexually Abused: No       PHYSICAL EXAM  There were no vitals filed for this visit.  There is no height or weight on file to calculate BMI.   General: The patient is well-developed and well-nourished and in no acute distress  HEENT:  Head is [place]/AT.  Sclera  are anicteric.  Funduscopic exam shows normal optic discs and retinal vessels.  Neck: No carotid bruits are noted.  The neck is nontender.  Cardiovascular: The heart has a regular rate and rhythm with a normal S1 and S2. There were no murmurs, gallops or rubs.    Skin: Extremities are without  rash or  edema.  Musculoskeletal:  Back is nontender  Neurologic Exam  Mental status: The patient is alert and oriented x 3 at the time of the examination. The patient has apparent normal recent and remote memory, with an apparently normal attention span and concentration ability.   Speech is normal.  Cranial nerves: Extraocular movements are full. Pupils are equal, round, and reactive to light and accomodation.  Visual fields are full.  Facial symmetry is present. There is good facial sensation to soft touch bilaterally.Facial strength is normal.  Trapezius and sternocleidomastoid strength is normal. No dysarthria is noted.  The tongue is midline, and the patient has symmetric elevation of the soft palate. No obvious hearing deficits are noted.  Motor:  Muscle bulk is normal.   Tone is normal. Strength is  5 / 5 in all 4 extremities.   Sensory: Sensory testing is intact to pinprick, soft touch and vibration sensation in all 4 extremities.  Coordination: Cerebellar testing reveals good finger-nose-finger and heel-to-shin bilaterally.  Gait and station: Station is normal.   Gait is normal. Tandem gait is normal. Romberg is negative.   Reflexes: Deep tendon reflexes are symmetric and normal bilaterally.   Plantar responses are flexor.    DIAGNOSTIC DATA (LABS, IMAGING, TESTING) - I reviewed patient records, labs, notes, testing and imaging myself where available.  Lab Results  Component Value Date   WBC 3.8 (L) 07/23/2021   HGB 11.5 (L) 07/23/2021   HCT 35.9 (L) 07/23/2021   MCV 79.6 07/23/2021   PLT 201.0 07/23/2021      Component Value Date/Time   NA 137 01/29/2022 0928   NA 134 (L)  06/05/2017 1052   K 4.5 01/29/2022 0928   K 3.5 06/05/2017 1052   CL 101 01/29/2022 0928   CO2 23 01/29/2022 0928   CO2 25 06/05/2017 1052   GLUCOSE 96 01/29/2022 0928   GLUCOSE 91 10/22/2021 0911   GLUCOSE 278 (H) 06/05/2017 1052   BUN 16 01/29/2022 0928   BUN 12.0 06/05/2017 1052   CREATININE 0.88 01/29/2022 0928   CREATININE 0.88 03/01/2021 0905   CREATININE 0.9 06/05/2017 1052   CALCIUM 9.5 01/29/2022 0928   CALCIUM 9.3 06/05/2017 1052   PROT 7.7 01/29/2022 0928   PROT 7.2 06/05/2017 1052   ALBUMIN 4.7 01/29/2022 0928   ALBUMIN 3.7 06/05/2017 1052   AST 16 01/29/2022 0928   AST 19 03/01/2021 0905   AST 19 06/05/2017 1052   ALT 15 01/29/2022 0928   ALT 17 03/01/2021 0905   ALT 28 06/05/2017 1052   ALKPHOS 177 (H) 01/29/2022 0928   ALKPHOS 95 06/05/2017 1052   BILITOT 0.3 01/29/2022 0928   BILITOT 0.3 03/01/2021 0905   BILITOT 0.26 06/05/2017 1052   GFRNONAA >60 04/07/2021 2227   GFRNONAA >60 03/01/2021 0905   GFRAA >60 01/09/2020 0827   Lab Results  Component Value Date   CHOL 202 (H) 11/15/2020   HDL 58 11/15/2020   LDLCALC 135 (H) 11/15/2020   TRIG 52 11/15/2020   CHOLHDL 3.5 11/15/2020   Lab Results  Component Value Date   HGBA1C 5.4 01/29/2022   Lab Results  Component Value Date   VITAMINB12 1,478 (H) 01/29/2022   Lab Results  Component Value Date   TSH 1.813 04/25/2022       ASSESSMENT AND PLAN  ***   Venora Kautzman A. [name] Shelling, MD, [facility] 12/06/4763, 4:65 PM Certified in Neurology, Clinical Neurophysiology, Sleep Medicine and Neuroimaging  [facility] [address]  [place], [place] [address] [phone]

## 2022-07-07 ENCOUNTER — Encounter: Payer: Self-pay | Admitting: Neurology

## 2022-07-07 ENCOUNTER — Ambulatory Visit (INDEPENDENT_AMBULATORY_CARE_PROVIDER_SITE_OTHER): Payer: 59 | Admitting: Neurology

## 2022-07-07 ENCOUNTER — Telehealth: Payer: Self-pay | Admitting: Neurology

## 2022-07-07 VITALS — BP 114/77 | HR 92 | Ht 65.0 in | Wt 245.5 lb

## 2022-07-07 DIAGNOSIS — H4710 Unspecified papilledema: Secondary | ICD-10-CM | POA: Diagnosis not present

## 2022-07-07 DIAGNOSIS — H5310 Unspecified subjective visual disturbances: Secondary | ICD-10-CM | POA: Diagnosis not present

## 2022-07-07 DIAGNOSIS — H04123 Dry eye syndrome of bilateral lacrimal glands: Secondary | ICD-10-CM | POA: Diagnosis not present

## 2022-07-07 DIAGNOSIS — C50912 Malignant neoplasm of unspecified site of left female breast: Secondary | ICD-10-CM

## 2022-07-07 DIAGNOSIS — C773 Secondary and unspecified malignant neoplasm of axilla and upper limb lymph nodes: Secondary | ICD-10-CM

## 2022-07-07 NOTE — Telephone Encounter (Signed)
[employer] sent to GI they obtain auth [phone] 

## 2022-07-08 ENCOUNTER — Ambulatory Visit: Payer: 59

## 2022-07-08 DIAGNOSIS — R293 Abnormal posture: Secondary | ICD-10-CM | POA: Diagnosis not present

## 2022-07-08 DIAGNOSIS — R262 Difficulty in walking, not elsewhere classified: Secondary | ICD-10-CM | POA: Diagnosis not present

## 2022-07-08 DIAGNOSIS — M25612 Stiffness of left shoulder, not elsewhere classified: Secondary | ICD-10-CM | POA: Diagnosis not present

## 2022-07-08 DIAGNOSIS — Z9889 Other specified postprocedural states: Secondary | ICD-10-CM

## 2022-07-08 DIAGNOSIS — R600 Localized edema: Secondary | ICD-10-CM | POA: Diagnosis not present

## 2022-07-08 DIAGNOSIS — M6281 Muscle weakness (generalized): Secondary | ICD-10-CM | POA: Diagnosis not present

## 2022-07-08 LAB — C-REACTIVE PROTEIN: CRP: <1 mg/L (ref 0–10)

## 2022-07-08 LAB — SEDIMENTATION RATE: Sed Rate: 5 mm/hr (ref 0–40)

## 2022-07-08 LAB — SJOGREN'S SYNDROME ANTIBODS(SSA + SSB)
ENA SSA (RO) Ab: <0.2 AI (ref 0.0–0.9)
ENA SSB (LA) Ab: <0.2 AI (ref 0.0–0.9)

## 2022-07-08 LAB — ANGIOTENSIN CONVERTING ENZYME: Angio Convert Enzyme: 40 U/L (ref 14–82)

## 2022-07-08 NOTE — Therapy (Signed)
OUTPATIENT PHYSICAL THERAPY ONCOLOGY TREATMENT  Patient Name: CHRISTEAN SILVESTRI MRN: [id number] DOB:11/18/1970, 52 y.o., female Today's Date: 07/08/2022  END OF SESSION:  PT End of Session - 07/08/22 0908     Visit Number 9    Number of Visits 16    Date for PT Re-Evaluation 08/01/22    PT Start Time 0909   late   PT Stop Time 0953    PT Time Calculation (min) 44 min    Activity Tolerance Patient tolerated treatment well    Behavior During Therapy [facility] for tasks assessed/performed              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   Allergic rhinitis 2012   Anemia 01/27/2017   prior to starting chemotherapy   Breast cancer ([place]) 01/14/2017   Left breast   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, left ([place]) 01/23/2017   Edema, lower extremity    Healthcare maintenance 03/25/2017   Hot flashes 03/2017   Hyperlipidemia 03/20/2016   Morbid obesity with body mass index (BMI) of 40.0 to 49.9 (HCC) 09/17/2017   NCGS (non-celiac gluten sensitivity)    Pre-diabetes 07/26/2018   Hgb A1C elevated on 07/26/2018, Gestational Diabetes 2012   Seasonal allergies 2012   seasonal allergies causes allergic rhinitis and itchy, dry eyes per pt   Vitamin D deficiency 05/2015   Past Surgical History:  Procedure Laterality Date   BREAST RECONSTRUCTION WITH PLACEMENT OF TISSUE EXPANDER AND FLEX HD (ACELLULAR HYDRATED DERMIS) Left 08/27/2017   Procedure: LEFT BREAST RECONSTRUCTION WITH PLACEMENT OF TISSUE EXPANDER AND FLEX HD;  Surgeon: Wallace Going, DO;  Location: [place];  Service: Plastics;  Laterality: Left;   CESAREAN SECTION     x2   LATISSIMUS FLAP TO BREAST Left 11/03/2018   LATISSIMUS FLAP TO BREAST Left 11/03/2018   Procedure: LATISSIMUS FLAP TO LEFT BREAST;  Surgeon: Wallace Going, DO;  Location: [place];  Service: Plastics;  Laterality: Left;   MASTECTOMY MODIFIED RADICAL Left  08/27/2017   MASTECTOMY MODIFIED RADICAL Left 08/27/2017   Procedure: LEFT MODIFIED RADICAL MASTECTOMY;  Surgeon: Erroll Luna, MD;  Location: [place];  Service: General;  Laterality: Left;   MASTOPEXY Right 03/03/2019   Procedure: RIGHT BREAST MASTOPEXY/REDUCTION;  Surgeon: Wallace Going, DO;  Location: [place];  Service: Plastics;  Laterality: Right;  3 hours, please   PORT-A-CATH REMOVAL Right 08/27/2017   Procedure: REMOVAL PORT-A-CATH RIGHT CHEST;  Surgeon: Erroll Luna, MD;  Location: [place];  Service: General;  Laterality: Right;   PORTACATH PLACEMENT Right 01/28/2017   Procedure: INSERTION PORT-A-CATH WITH ULTRASOUND;  Surgeon: Erroll Luna, MD;  Location: [place];  Service: General;  Laterality: Right;   REDUCTION MAMMAPLASTY Right    REMOVAL OF TISSUE EXPANDER AND PLACEMENT OF IMPLANT Left 03/03/2019   Procedure: REMOVAL OF TISSUE EXPANDER AND PLACEMENT OF EXPANDER;  Surgeon: Wallace Going, DO;  Location: [place];  Service: Plastics;  Laterality: Left;   REMOVAL OF TISSUE EXPANDER AND PLACEMENT OF IMPLANT Left 05/25/2019   Procedure: LEFT BREAST REMOVAL OF TISSUE EXPANDER AND PLACEMENT OF IMPLANT;  Surgeon: Wallace Going, DO;  Location: [place];  Service: Plastics;  Laterality: Left;   TISSUE EXPANDER PLACEMENT Left 11/03/2018   Procedure: PLACEMENT OF TISSUE EXPANDER LEFT BREAST;  Surgeon: Wallace Going, DO;  Location: [place];  Service: Plastics;  Laterality: Left;  Total case time is 3.5 hours   TUBAL LIGATION Bilateral 01/21/2011   Patient Active Problem List   Diagnosis Date Noted   Boil of buttock, Left 12/24/2021   Hyperthyroidism 07/22/2021   Low serum thyroid stimulating hormone (TSH) 06/12/2021   Other fatigue 05/23/2021   Hemoglobin low 05/23/2021   Polyphagia 05/16/2020   Vitamin D deficiency 05/16/2020   At risk for osteoporosis 05/16/2020   Prediabetes 05/01/2020    Elevated blood pressure reading 05/01/2020   At risk for impaired metabolic function 09/32/3557   Breast asymmetry following reconstructive surgery 09/20/2019   Essential hypertension 08/17/2018   Class 3 severe obesity with serious comorbidity and body mass index (BMI) of 40.0 to 44.9 in adult [facility]) 09/17/2017   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   Malignant neoplasm of upper-inner quadrant of left breast in female, estrogen receptor positive ([place]) 07/27/2017   Healthcare maintenance 03/25/2017   Malignant 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, estrogen receptor positive ([place]) 01/23/2017   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, left ([place]) 01/23/2017    PCP: Horald Pollen, MD  REFERRING PROVIDER: Donnamarie Rossetti PA-C  REFERRING DIAG: Post Mastectomy, Left upper back and UE pain  THERAPY DIAG:  Stiffness of left shoulder, not elsewhere classified  Status post left breast reconstruction  Abnormal posture  ONSET DATE: 11/03/2018 with recent exacerbation  Rationale for Evaluation and Treatment: Rehabilitation  SUBJECTIVE:                                                                                                                                                                                           SUBJECTIVE STATEMENT:  Did well after last visit. Still having less back pain, and less pain that wraps around.  No breast pain now when I bend over.  PERTINENT HISTORY:  Left breast cancer diagnosed 01/10/17 by mammogram and needle biopsy. Then had neo-adjuvant chemo starting 02/10/17 until 06/26/17; had left mastectomy 08/27/17 with ALND and immediate expander placed. Radiation completed. Expander removed and replaced on May 27, 19 b/c it did not get expanded enough prior to radiation, also had lat flap 11/03/18. Had expander removed and replaced again on 03/03/19 due to a small leak, pt had reduction on R breast at this time   PAIN:  Are you  having pain? No ,not presently  PRECAUTIONS: Other: left UE lymphedema risk  WEIGHT BEARING RESTRICTIONS: No  FALLS:  Has patient fallen in last 6 months? No  LIVING ENVIRONMENT: Lives with: her 54 yr old and 3 yr old daughters Lives in: House/apartment Stairs: No; External: 2 steps; none  Has following equipment at home: Crutches and bed side commode  OCCUPATION: [employer], Financial controller  LEISURE: walk, watch movies, spend time with kids.  HAND DOMINANCE: right   PRIOR LEVEL OF FUNCTION: Independent  PATIENT GOALS: Get rid of sharp pain.   OBJECTIVE:  COGNITION: Overall cognitive status: Within functional limits for tasks assessed   PALPATION: Tender left scapular region, lateral trunk, pectorals, very tight UT  OBSERVATIONS / OTHER ASSESSMENTS: no visible swelling however pt with decreased muscle mass noted  left scapular region. Healed incision from lat flap with mild tenderness mid incision. Mild compensation and increased effort noted with left shoulder AROM  SENSATION: Light touch: Appears intact POSTURE: significant forward head, rounded shoulders  UPPER EXTREMITY AROM/PROM:  A/PROM RIGHT   eval   Shoulder extension 55  Shoulder flexion 140  Shoulder abduction 157  Shoulder internal rotation 70  Shoulder external rotation 95    (Blank rows = not tested)  A/PROM LEFT   eval LEFT 06/19/22 LEFT 07/03/2022  Shoulder extension 45    Shoulder flexion 132 158 152  Shoulder abduction 135 156 155  Shoulder internal rotation 54    Shoulder external rotation 75      (Blank rows = not tested)  CERVICAL AROM: All within functional  limits:    UPPER EXTREMITY STRENGTH:  Left grip 40,52,45; avg 45.6 Right grip 50,47,55 avg 50.66   LYMPHEDEMA ASSESSMENTS:   SURGERY TYPE/DATE: Left Modified Radical Mastectomy 08/27/2017, 11/03/2018 Left Lat Flap with tissue expander, 05/25/2019 Removal of expander and placement of implant  NUMBER OF LYMPH NODES  REMOVED: 1/4  CHEMOTHERAPY: yes neoadjuvant  RADIATION:yes  HORMONE TREATMENT: yes, Letrozole  INFECTIONS: NO  LYMPHEDEMA ASSESSMENTS:   LANDMARK RIGHT  eval RIGHT  06/10/2022  10 cm proximal to olecranon process 39.5 39.5  Olecranon process '30 30  10 '$ cm proximal to ulnar styloid process 24 25.9  Just proximal to ulnar styloid process 17.5 17.7  Across hand at thumb web space 20 20  At base of 2nd digit 6.4 6.3  (Blank rows = not tested)  LANDMARK LEFT  eval LEFT 06/10/2022  10 cm proximal to olecranon process 39.5 40  Olecranon process 29.'7 31  10 '$ cm proximal to ulnar styloid process 25.5 26.4  Just proximal to ulnar styloid process 18.7 19  Across hand at thumb web space 19.6 20.7  At base of 2nd digit 6.4 6.1  (Blank rows = not tested)    QUICK DASH SURVEY: 31.82      The patient was assessed using the L-Dex machine today to produce a lymphedema index baseline score. The patient will be reassessed on a regular basis (typically every 3 months) to obtain new L-Dex scores. If the score is > 6.5 points away from his/her baseline score indicating onset of subclinical lymphedema, it will be recommended to wear a compression garment for 4 weeks, 12 hours per day and then be reassessed. If the score continues to be > 6.5 points from baseline at reassessment, we will initiate lymphedema treatment. Assessing in this manner has a 95% rate of preventing clinically significant lymphedema.  TODAY'S TREATMENT:  DATE:  07/08/2022  Overhead pulleys x 1 min flexion and abduction Ball rolls for flexion x 10 4 D ball ball rolls x 10 ea Supine on Half foam roll: Bilateral AROM flexion, scaption and horizontal abd x 5 Jobes flexion and scaption x 5 ea with back against wall STM to left pectorals, lats, UT, and left intercostals and in SL to scapular  region and lats in Rt S/L with cocoa butter PROM left shoulder flex, abd and D2, ER with scapular depression    07/03/2022 Overhead pulleys x 1 min flexion and abduction 4 D ball ball rolls x 10 ea Supine on Half foam roll: Bilateral AROM flexion, scaption and horizontal abd x 5 STM to left pectorals, lats, UT, and left intercostals and in SL to scapular region and lats in Rt S/L with cocoa butter PROM left shoulder flex, abd and D2 with scapular depression     07/01/22: Therapeutic Exercises Pulleys into flexion and abd x2 mins each with VC's to remind pt to decrease Lt scapular compensation Roll yellow ball up wall into flexion x10 and Lt abd x5 Modified downward dog on wall 5x, 5 sec holds Supine over half foam roll: Bil UE's into horz abd x10 and then bil scaption into a "V" 2 x 5 as pt reports UE's beginning to feel fatigued Manual Therapy STM to left pectorals, lats, UT and in SL to scapular region and lats in Rt S/L with cocoa butter PROM left shoulder flex, abd and D2 with scapular depression   06/25/2022 STM to left pectorals, lats, UT and in SL to scapular region and lats. PROM left shoulder flex, scaption, ER Supine wand flexion and scaption x 5 Supine AROM bilateral shoulder flexion, scaption, horizontal abd x 5 Reviewed supine scapular strengthening exercises for all with yellow band x 10VC's to keep scapula engaged Pt set up 6 month SOZO 06/19/22: Circumference measurements taken today and SOZO completed, ROM also assessed Therapeutic Exercises Pulleys into flexion and abd x2 mins each with VCs to relax shoulders Ball roll up wall into Lt UE abd and flexion x10 each returning therapist demo Instructed pt in supine scapular strengthening series: narrow and wide grip, horizontal abduction, ER, diagonals with pt returning therapist demo for all x 10 reps with yellow band - required v/c to keep scapula engaged throughout to avoid impingement like pain- added to HEP   Home  exercise and patient education.  Lower trunk rotation : lie on your back with goal post arms and take from side to side Standing Rows: Theraband around doorknob, posture tall with back of head to ceiling, Reach both hands back to hips pinching shoulder blades together and reaching chest forward        06/06/2022  PATIENT EDUCATION:  Education details: Access Code: [email or web address] URL: [email or web address] Date: 06/06/2022 Prepared by: Cheral Almas  Exercises - Supine Shoulder Flexion AAROM with Hands Clasped  - 2 x daily - 7 x weekly - 1 sets - 5 reps - 5 hold - Supine Chest Stretch with Arms Behind Head  - 2 x daily - 7 x weekly - 1 sets - 5 reps - 5 hold - Standing Shoulder Abduction Wall Slide with Thumb Out  - 2 x daily - 7 x weekly - 1 sets - 5 reps - 5 hold -07/01/22: Added supine on ball for bil horz abd and bil UE scaption into a "V" Person educated: Patient Education method: Explanation, Demonstration, and Handouts Education comprehension: verbalized understanding and returned demonstration  HOME EXERCISE PROGRAM:  Supine Shoulder Flexion AAROM with Hands Clasped  - 2 x daily - 7 x weekly - 1 sets - 5 reps - 5 hold - Supine Chest Stretch with Arms Behind Head  - 2 x daily - 7 x weekly - 1 sets - 5 reps - 5 hold - Standing Shoulder Abduction Wall Slide with Thumb Out  - 2 x daily - 7 x weekly - 1 sets - 5 reps - 5 hold Person educated: Patient Access Code: [email or web address] URL: [email or web address] Date: 06/12/2022 Prepared by: Collie Siad  Exercises Access Code: [email or web address] URL: [email or web address] Date: 06/19/2022 Prepared by: Manus Gunning  Exercises - Supine Shoulder Flexion AAROM with Hands Clasped  - 2 x daily - 7 x weekly - 1 sets - 5 reps - 5 hold - Supine Chest Stretch with Arms Behind Head  - 2 x daily - 7 x weekly - 1 sets - 5 reps - 5 hold - Standing Shoulder Abduction Wall Slide with Thumb Out  - 2 x daily -  7 x weekly - 1 sets - 5 reps - 5 hold - Latissimus Dorsi Stretch at Wall  - 2-3 x daily - 7 x weekly - 5 reps - 5-10 hold - Supine Lower Trunk Rotation  - 2 x daily - 7 x weekly - 1 sets - 10 reps - 10-20 hold - Shoulder extension with resistance - Neutral  - 1 x daily - 5 x weekly - 1-2 sets - 10 reps - 3 hold - Downward Dog  - 1 x daily - 7 x weekly - 5 reps - 5 hold - Tennis Ball Self Massage on Wall  - 1 x daily - 7 x weekly - 1 sets - 10 reps - Supine Shoulder External Rotation with Resistance  - 1 x daily - 4-5 x weekly - 1 sets - 10 reps - Supine Shoulder Horizontal Abduction with Resistance  - 1 x daily - 4-5 x weekly - 1 sets - 10 reps - Supine narrow and wide grip flexion  - 1 x daily - 4-5 x weekly - 1-2 sets - 10 reps - 3 sec hold - Supine PNF D2   - 1 x daily - 4-5 x weekly - 1-2 sets - 10 reps - 3 sec hold  ASSESSMENT:  CLINICAL IMPRESSION:  Pt is doing well with decreased back and breast pain. She continues with muscular  tightness in Pecs/lats, intercostals and scapular area. She fatigues very quickly with left Ue exs and requires VC's to keep scapula depressed.  OBJECTIVE IMPAIRMENTS: decreased ROM, decreased strength, impaired UE functional use, postural dysfunction, and pain.   ACTIVITY LIMITATIONS: sleeping, reach over head, and hygiene/grooming  PARTICIPATION LIMITATIONS:  She participates in all with occasional sharp pains  PERSONAL FACTORS: 1-2 comorbidities: Left mastectomy with reconstruction and expanders  are also affecting patient's functional outcome.   REHAB POTENTIAL: Good  CLINICAL DECISION MAKING: Stable/uncomplicated  EVALUATION COMPLEXITY: Low  GOALS: Goals reviewed with patient? Yes LONG TERM GOALS: Target date: 08/01/2022  Pt will have left shoulder abduction 150 for improved reaching and decreased tightness Baseline: 135 Goal status: MET 07/03/2022  2.  Pt will have improved ER on left to 88 degrees for decreased pectoral tightness Baseline:  75 Goal status: INITIAL  3.  Pt will improve quick dash to no greater than 15% Baseline: 31.82 Goal status: INITIAL  4.  Pt will have decreased tenderness/sharp pain by atleast 50% Baseline:  Goal status: INITIAL  PLAN:  PT FREQUENCY: 1-2x/week  PT DURATION: 8 weeks  PLANNED INTERVENTIONS: Therapeutic exercises, Neuromuscular re-education, Patient/Family education, Self Care, Joint mobilization, Orthotic/Fit training, Dry Needling, and Manual therapy  PLAN FOR NEXT SESSION: Pec wall stretch, standing lat stretch, LTR, pulleys, STM left upper quarter/cupping, DN if warranted,PROM, progress to strength as tolerated, measure circumference of arms Progress postural exercise.  Monitor left arm for lymphedem, SOZO 6 month set up    Collie Siad, PTA 07/08/22 9:54 AM

## 2022-07-10 ENCOUNTER — Ambulatory Visit (INDEPENDENT_AMBULATORY_CARE_PROVIDER_SITE_OTHER): Payer: 59 | Admitting: Adult Health

## 2022-07-10 ENCOUNTER — Ambulatory Visit: Payer: 59 | Attending: Student

## 2022-07-10 ENCOUNTER — Other Ambulatory Visit (HOSPITAL_COMMUNITY): Payer: Self-pay

## 2022-07-10 ENCOUNTER — Encounter (INDEPENDENT_AMBULATORY_CARE_PROVIDER_SITE_OTHER): Payer: Self-pay | Admitting: Adult Health

## 2022-07-10 VITALS — BP 111/77 | HR 73 | Temp 98.1°F | Ht 65.0 in | Wt 238.0 lb

## 2022-07-10 DIAGNOSIS — Z6839 Body mass index (BMI) 39.0-39.9, adult: Secondary | ICD-10-CM | POA: Diagnosis not present

## 2022-07-10 DIAGNOSIS — Z9889 Other specified postprocedural states: Secondary | ICD-10-CM | POA: Insufficient documentation

## 2022-07-10 DIAGNOSIS — M25612 Stiffness of left shoulder, not elsewhere classified: Secondary | ICD-10-CM | POA: Diagnosis not present

## 2022-07-10 DIAGNOSIS — R7303 Prediabetes: Secondary | ICD-10-CM | POA: Diagnosis not present

## 2022-07-10 DIAGNOSIS — R293 Abnormal posture: Secondary | ICD-10-CM | POA: Insufficient documentation

## 2022-07-10 DIAGNOSIS — E669 Obesity, unspecified: Secondary | ICD-10-CM | POA: Diagnosis not present

## 2022-07-10 DIAGNOSIS — R197 Diarrhea, unspecified: Secondary | ICD-10-CM

## 2022-07-10 MED ORDER — ONDANSETRON HCL 4 MG PO TABS
4.0000 mg | ORAL_TABLET | Freq: Three times a day (TID) | ORAL | 0 refills | Status: AC | PRN
  Filled 2022-07-10: qty 20, 7d supply, fill #0

## 2022-07-10 NOTE — Progress Notes (Signed)
Error duplicate charting see previous notes.

## 2022-07-10 NOTE — Therapy (Signed)
OUTPATIENT PHYSICAL THERAPY ONCOLOGY TREATMENT  Patient Name: Mackenzie [name] MRN: [id number] DOB:09-Apr-1971, 52 y.o., female Today's Date: 07/10/2022  END OF SESSION:  PT End of Session - 07/10/22 1004     Visit Number 10    Number of Visits 16    Date for PT Re-Evaluation 08/01/22    PT Start Time 1005    PT Stop Time 1100    PT Time Calculation (min) 55 min    Activity Tolerance Patient tolerated treatment well    Behavior During Therapy [facility] for tasks assessed/performed              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   Allergic rhinitis 2012   Anemia 01/27/2017   prior to starting chemotherapy   Breast cancer ([place]) 01/14/2017   Left breast   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, left ([place]) 01/23/2017   Edema, lower extremity    Healthcare maintenance 03/25/2017   Hot flashes 03/2017   Hyperlipidemia 03/20/2016   Morbid obesity with body mass index (BMI) of 40.0 to 49.9 (HCC) 09/17/2017   NCGS (non-celiac gluten sensitivity)    Pre-diabetes 07/26/2018   Hgb A1C elevated on 07/26/2018, Gestational Diabetes 2012   Seasonal allergies 2012   seasonal allergies causes allergic rhinitis and itchy, dry eyes per pt   Vitamin D deficiency 05/2015   Past Surgical History:  Procedure Laterality Date   BREAST RECONSTRUCTION WITH PLACEMENT OF TISSUE EXPANDER AND FLEX HD (ACELLULAR HYDRATED DERMIS) Left 08/27/2017   Procedure: LEFT BREAST RECONSTRUCTION WITH PLACEMENT OF TISSUE EXPANDER AND FLEX HD;  Surgeon: Wallace Going, DO;  Location: [place];  Service: Plastics;  Laterality: Left;   CESAREAN SECTION     x2   LATISSIMUS FLAP TO BREAST Left 11/03/2018   LATISSIMUS FLAP TO BREAST Left 11/03/2018   Procedure: LATISSIMUS FLAP TO LEFT BREAST;  Surgeon: Wallace Going, DO;  Location: [place];  Service: Plastics;  Laterality: Left;   MASTECTOMY MODIFIED RADICAL Left 08/27/2017    MASTECTOMY MODIFIED RADICAL Left 08/27/2017   Procedure: LEFT MODIFIED RADICAL MASTECTOMY;  Surgeon: Erroll Luna, MD;  Location: [place];  Service: General;  Laterality: Left;   MASTOPEXY Right 03/03/2019   Procedure: RIGHT BREAST MASTOPEXY/REDUCTION;  Surgeon: Wallace Going, DO;  Location: [place];  Service: Plastics;  Laterality: Right;  3 hours, please   PORT-A-CATH REMOVAL Right 08/27/2017   Procedure: REMOVAL PORT-A-CATH RIGHT CHEST;  Surgeon: Erroll Luna, MD;  Location: [place];  Service: General;  Laterality: Right;   PORTACATH PLACEMENT Right 01/28/2017   Procedure: INSERTION PORT-A-CATH WITH ULTRASOUND;  Surgeon: Erroll Luna, MD;  Location: [place];  Service: General;  Laterality: Right;   REDUCTION MAMMAPLASTY Right    REMOVAL OF TISSUE EXPANDER AND PLACEMENT OF IMPLANT Left 03/03/2019   Procedure: REMOVAL OF TISSUE EXPANDER AND PLACEMENT OF EXPANDER;  Surgeon: Wallace Going, DO;  Location: [place];  Service: Plastics;  Laterality: Left;   REMOVAL OF TISSUE EXPANDER AND PLACEMENT OF IMPLANT Left 05/25/2019   Procedure: LEFT BREAST REMOVAL OF TISSUE EXPANDER AND PLACEMENT OF IMPLANT;  Surgeon: Wallace Going, DO;  Location: [place];  Service: Plastics;  Laterality: Left;   TISSUE EXPANDER PLACEMENT Left 11/03/2018   Procedure: PLACEMENT OF TISSUE EXPANDER LEFT BREAST;  Surgeon: Wallace Going, DO;  Location: [place];  Service: Plastics;  Laterality:  Left;  Total case time is 3.5 hours   TUBAL LIGATION Bilateral 01/21/2011   Patient Active Problem List   Diagnosis Date Noted   Boil of buttock, Left 12/24/2021   Hyperthyroidism 07/22/2021   Low serum thyroid stimulating hormone (TSH) 06/12/2021   Other fatigue 05/23/2021   Hemoglobin low 05/23/2021   Polyphagia 05/16/2020   Vitamin D deficiency 05/16/2020   At risk for osteoporosis 05/16/2020   Prediabetes 05/01/2020   Elevated blood  pressure reading 05/01/2020   At risk for impaired metabolic function 91/47/8295   Breast asymmetry following reconstructive surgery 09/20/2019   Essential hypertension 08/17/2018   Class 3 severe obesity with serious comorbidity and body mass index (BMI) of 40.0 to 44.9 in adult [facility]) 09/17/2017   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   Malignant neoplasm of upper-inner quadrant of left breast in female, estrogen receptor positive ([place]) 07/27/2017   Healthcare maintenance 03/25/2017   Malignant 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, estrogen receptor positive ([place]) 01/23/2017   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, left ([place]) 01/23/2017    PCP: Horald Pollen, MD  REFERRING PROVIDER: Donnamarie Rossetti PA-C  REFERRING DIAG: Post Mastectomy, Left upper back and UE pain  THERAPY DIAG:  Stiffness of left shoulder, not elsewhere classified  Status post left breast reconstruction  Abnormal posture  ONSET DATE: 11/03/2018 with recent exacerbation  Rationale for Evaluation and Treatment: Rehabilitation  SUBJECTIVE:                                                                                                                                                                                           SUBJECTIVE STATEMENT:  Did well after last visit. Doing well today, just tired since its the end of the week.  PERTINENT HISTORY:  Left breast cancer diagnosed 01/10/17 by mammogram and needle biopsy. Then had neo-adjuvant chemo starting 02/10/17 until 06/26/17; had left mastectomy 08/27/17 with ALND and immediate expander placed. Radiation completed. Expander removed and replaced on May 27, 19 b/c it did not get expanded enough prior to radiation, also had lat flap 11/03/18. Had expander removed and replaced again on 03/03/19 due to a small leak, pt had reduction on R breast at this time   PAIN:  Are you having pain? No ,not presently A little soreness in right  side of back today from lifitng [employer]  PRECAUTIONS: Other: left UE lymphedema risk  WEIGHT BEARING RESTRICTIONS: No  FALLS:  Has patient fallen in last 6 months? No  LIVING ENVIRONMENT: Lives with: her 54 yr old and 40 yr old daughters Lives in: House/apartment Stairs:  No; External: 2 steps; none Has following equipment at home: Crutches and bed side commode  OCCUPATION: [employer], Financial controller  LEISURE: walk, watch movies, spend time with kids.  HAND DOMINANCE: right   PRIOR LEVEL OF FUNCTION: Independent  PATIENT GOALS: Get rid of sharp pain.   OBJECTIVE:  COGNITION: Overall cognitive status: Within functional limits for tasks assessed   PALPATION: Tender left scapular region, lateral trunk, pectorals, very tight UT  OBSERVATIONS / OTHER ASSESSMENTS: no visible swelling however pt with decreased muscle mass noted  left scapular region. Healed incision from lat flap with mild tenderness mid incision. Mild compensation and increased effort noted with left shoulder AROM  SENSATION: Light touch: Appears intact POSTURE: significant forward head, rounded shoulders  UPPER EXTREMITY AROM/PROM:  A/PROM RIGHT   eval   Shoulder extension 55  Shoulder flexion 140  Shoulder abduction 157  Shoulder internal rotation 70  Shoulder external rotation 95    (Blank rows = not tested)  A/PROM LEFT   eval LEFT 06/19/22 LEFT 07/03/2022  Shoulder extension 45    Shoulder flexion 132 158 152  Shoulder abduction 135 156 155  Shoulder internal rotation 54    Shoulder external rotation 75      (Blank rows = not tested)  CERVICAL AROM: All within functional  limits:    UPPER EXTREMITY STRENGTH:  Left grip 40,52,45; avg 45.6 Right grip 50,47,55 avg 50.66   LYMPHEDEMA ASSESSMENTS:   SURGERY TYPE/DATE: Left Modified Radical Mastectomy 08/27/2017, 11/03/2018 Left Lat Flap with tissue expander, 05/25/2019 Removal of expander and placement of implant  NUMBER  OF LYMPH NODES REMOVED: 1/4  CHEMOTHERAPY: yes neoadjuvant  RADIATION:yes  HORMONE TREATMENT: yes, Letrozole  INFECTIONS: NO  LYMPHEDEMA ASSESSMENTS:   LANDMARK RIGHT  eval RIGHT  06/10/2022  10 cm proximal to olecranon process 39.5 39.5  Olecranon process '30 30  10 '$ cm proximal to ulnar styloid process 24 25.9  Just proximal to ulnar styloid process 17.5 17.7  Across hand at thumb web space 20 20  At base of 2nd digit 6.4 6.3  (Blank rows = not tested)  LANDMARK LEFT  eval LEFT 06/10/2022  10 cm proximal to olecranon process 39.5 40  Olecranon process 29.'7 31  10 '$ cm proximal to ulnar styloid process 25.5 26.4  Just proximal to ulnar styloid process 18.7 19  Across hand at thumb web space 19.6 20.7  At base of 2nd digit 6.4 6.1  (Blank rows = not tested)    QUICK DASH SURVEY: 31.82      The patient was assessed using the L-Dex machine today to produce a lymphedema index baseline score. The patient will be reassessed on a regular basis (typically every 3 months) to obtain new L-Dex scores. If the score is > 6.5 points away from his/her baseline score indicating onset of subclinical lymphedema, it will be recommended to wear a compression garment for 4 weeks, 12 hours per day and then be reassessed. If the score continues to be > 6.5 points from baseline at reassessment, we will initiate lymphedema treatment. Assessing in this manner has a 95% rate of preventing clinically significant lymphedema.  TODAY'S TREATMENT:  DATE: 07/10/2022 Ball rolls on wall x 10 flexion, 5 for abduction Standing shoulder flexon and scaption in front of mirror x10 ea 4 D ball rolls x 10 ea Yellow band ER x 10 standing STM to left pectorals, lats, UT, and left intercostals and in SL to scapular region and lats in Rt S/L with cocoa butter PROM left shoulder flex,  abd and D2, ER with scapular depression  Supine alphabet x 1 Rhythmic stabs at 90 deg. Flexion 3 x 20 sec Serratus punches x 10 Supine Bilateral AROM scaption and horizontal abd x 5 ea Updated HEP with standing flex and scaption, supine alphabet, ball rolls on wall  07/08/2022 Overhead pulleys x 1 min flexion and abduction Ball rolls for flexion x 10 4 D ball ball rolls x 10 ea Supine on Half foam roll: Bilateral AROM flexion, scaption and horizontal abd x 5 Jobes flexion and scaption x 5 ea with back against wall STM to left pectorals, lats, UT, and left intercostals and in SL to scapular region and lats in Rt S/L with cocoa butter PROM left shoulder flex, abd and D2, ER with scapular depression   07/03/2022 Overhead pulleys x 1 min flexion and abduction 4 D ball ball rolls x 10 ea Supine on Half foam roll: Bilateral AROM flexion, scaption and horizontal abd x 5 STM to left pectorals, lats, UT, and left intercostals and in SL to scapular region and lats in Rt S/L with cocoa butter PROM left shoulder flex, abd and D2 with scapular depression   07/01/22: Therapeutic Exercises Pulleys into flexion and abd x2 mins each with VC's to remind pt to decrease Lt scapular compensation Roll yellow ball up wall into flexion x10 and Lt abd x5 Modified downward dog on wall 5x, 5 sec holds Supine over half foam roll: Bil UE's into horz abd x10 and then bil scaption into a "V" 2 x 5 as pt reports UE's beginning to feel fatigued Manual Therapy STM to left pectorals, lats, UT and in SL to scapular region and lats in Rt S/L with cocoa butter PROM left shoulder flex, abd and D2 with scapular depression   06/25/2022 STM to left pectorals, lats, UT and in SL to scapular region and lats. PROM left shoulder flex, scaption, ER Supine wand flexion and scaption x 5 Supine AROM bilateral shoulder flexion, scaption, horizontal abd x 5 Reviewed supine scapular strengthening exercises for all with yellow band x  10VC's to keep scapula engaged Pt set up 6 month SOZO 06/19/22: Circumference measurements taken today and SOZO completed, ROM also assessed Therapeutic Exercises Pulleys into flexion and abd x2 mins each with VCs to relax shoulders Ball roll up wall into Lt UE abd and flexion x10 each returning therapist demo Instructed pt in supine scapular strengthening series: narrow and wide grip, horizontal abduction, ER, diagonals with pt returning therapist demo for all x 10 reps with yellow band - required v/c to keep scapula engaged throughout to avoid impingement like pain- added to HEP   Home exercise and patient education.  Lower trunk rotation : lie on your back with goal post arms and take from side to side Standing Rows: Theraband around doorknob, posture tall with back of head to ceiling, Reach both hands back to hips pinching shoulder blades together and reaching chest forward        06/06/2022  PATIENT EDUCATION:  Education details: Access Code: [email or web address] URL: [email or web address] Date: 07/10/2022 Prepared by: Cheral Almas  Exercises - Standing [place]  with Mini Swiss Ball  - 1 x daily - 7 x weekly - 3 sets - 10 reps - Supine Shoulder Alphabet  - 1 x daily - 7 x weekly - 3 sets - 10 reps - Standing Shoulder Flexion to 90 Degrees  - 1 x daily - 7 x weekly - 3 sets - 10 reps - Standing Shoulder Scaption  - 1 x daily - 7 x weekly - 3 sets - 10 reps   Access Code: [email or web address] URL: [email or web address] Date: 06/06/2022 Prepared by: Cheral Almas  Exercises - Supine Shoulder Flexion AAROM with Hands Clasped  - 2 x daily - 7 x weekly - 1 sets - 5 reps - 5 hold - Supine Chest Stretch with Arms Behind Head  - 2 x daily - 7 x weekly - 1 sets - 5 reps - 5 hold - Standing Shoulder Abduction Wall Slide with Thumb Out  - 2 x daily - 7 x weekly - 1 sets - 5 reps - 5 hold -07/01/22: Added supine on ball for bil horz abd and bil UE scaption into a  "V" Person educated: Patient Education method: Explanation, Demonstration, and Handouts Education comprehension: verbalized understanding and returned demonstration  HOME EXERCISE PROGRAM:  Supine Shoulder Flexion AAROM with Hands Clasped  - 2 x daily - 7 x weekly - 1 sets - 5 reps - 5 hold - Supine Chest Stretch with Arms Behind Head  - 2 x daily - 7 x weekly - 1 sets - 5 reps - 5 hold - Standing Shoulder Abduction Wall Slide with Thumb Out  - 2 x daily - 7 x weekly - 1 sets - 5 reps - 5 hold Person educated: Patient Access Code: [email or web address] URL: [email or web address] Date: 06/12/2022 Prepared by: Collie Siad  Exercises Access Code: [email or web address] URL: [email or web address] Date: 06/19/2022 Prepared by: Manus Gunning  Exercises - Supine Shoulder Flexion AAROM with Hands Clasped  - 2 x daily - 7 x weekly - 1 sets - 5 reps - 5 hold - Supine Chest Stretch with Arms Behind Head  - 2 x daily - 7 x weekly - 1 sets - 5 reps - 5 hold - Standing Shoulder Abduction Wall Slide with Thumb Out  - 2 x daily - 7 x weekly - 1 sets - 5 reps - 5 hold - Latissimus Dorsi Stretch at Wall  - 2-3 x daily - 7 x weekly - 5 reps - 5-10 hold - Supine Lower Trunk Rotation  - 2 x daily - 7 x weekly - 1 sets - 10 reps - 10-20 hold - Shoulder extension with resistance - Neutral  - 1 x daily - 5 x weekly - 1-2 sets - 10 reps - 3 hold - Downward Dog  - 1 x daily - 7 x weekly - 5 reps - 5 hold - Tennis Ball Self Massage on Wall  - 1 x daily - 7 x weekly - 1 sets - 10 reps - Supine Shoulder External Rotation with Resistance  - 1 x daily - 4-5 x weekly - 1 sets - 10 reps - Supine Shoulder Horizontal Abduction with Resistance  - 1 x daily - 4-5 x weekly - 1 sets - 10 reps - Supine narrow and wide grip flexion  - 1 x daily - 4-5 x weekly - 1-2 sets - 10 reps - 3 sec hold - Supine PNF D2   - 1 x daily - 4-5 x weekly - 1-2 sets - 10 reps - 3 sec  hold  ASSESSMENT:  CLINICAL  IMPRESSION:   Pt continues to do very well without back pain and no breast pain with leaning over for the last couple of weeks.  She continues with tenderness to palpation of the left lower intercostals, and pectorals. Her left arm continues to fatigue very easily and her HEP was updated to include scapular stabilization/strength.    OBJECTIVE IMPAIRMENTS: decreased ROM, decreased strength, impaired UE functional use, postural dysfunction, and pain.   ACTIVITY LIMITATIONS: sleeping, reach over head, and hygiene/grooming  PARTICIPATION LIMITATIONS:  She participates in all with occasional sharp pains  PERSONAL FACTORS: 1-2 comorbidities: Left mastectomy with reconstruction and expanders  are also affecting patient's functional outcome.   REHAB POTENTIAL: Good  CLINICAL DECISION MAKING: Stable/uncomplicated  EVALUATION COMPLEXITY: Low  GOALS: Goals reviewed with patient? Yes LONG TERM GOALS: Target date: 08/01/2022  Pt will have left shoulder abduction 150 for improved reaching and decreased tightness Baseline: 135 Goal status: MET 07/03/2022  2.  Pt will have improved ER on left to 88 degrees for decreased pectoral tightness Baseline: 75 Goal status: INITIAL  3.  Pt will improve quick dash to no greater than 15% Baseline: 31.82 Goal status: INITIAL  4.  Pt will have decreased tenderness/sharp pain by atleast 50% Baseline:  Goal status: INITIAL  PLAN:  PT FREQUENCY: 1-2x/week  PT DURATION: 8 weeks  PLANNED INTERVENTIONS: Therapeutic exercises, Neuromuscular re-education, Patient/Family education, Self Care, Joint mobilization, Orthotic/Fit training, Dry Needling, and Manual therapy  PLAN FOR NEXT SESSION: scapular stabs,, LTR, pulleys, STM left upper quarter/cupping, DN if warranted,PROM, progress to strength as tolerated, measure circumference of arms Progress postural exercise.  Monitor left arm for lymphedem, SOZO 6 month set up    Cheral Almas, PTA 07/10/22 11:23  AM

## 2022-07-11 ENCOUNTER — Other Ambulatory Visit (HOSPITAL_COMMUNITY): Payer: Self-pay

## 2022-07-11 ENCOUNTER — Other Ambulatory Visit (HOSPITAL_COMMUNITY): Payer: Self-pay | Admitting: Cardiology

## 2022-07-11 DIAGNOSIS — Z8249 Family history of ischemic heart disease and other diseases of the circulatory system: Secondary | ICD-10-CM

## 2022-07-14 ENCOUNTER — Ambulatory Visit: Payer: 59 | Admitting: Rehabilitation

## 2022-07-14 ENCOUNTER — Encounter: Payer: Self-pay | Admitting: Rehabilitation

## 2022-07-14 ENCOUNTER — Other Ambulatory Visit (HOSPITAL_COMMUNITY): Payer: Self-pay

## 2022-07-14 ENCOUNTER — Ambulatory Visit (INDEPENDENT_AMBULATORY_CARE_PROVIDER_SITE_OTHER): Payer: 59 | Admitting: Internal Medicine

## 2022-07-14 ENCOUNTER — Encounter: Payer: Self-pay | Admitting: Internal Medicine

## 2022-07-14 VITALS — BP 122/70 | HR 91 | Ht 65.0 in | Wt 245.0 lb

## 2022-07-14 DIAGNOSIS — R293 Abnormal posture: Secondary | ICD-10-CM | POA: Diagnosis not present

## 2022-07-14 DIAGNOSIS — E0590 Thyrotoxicosis, unspecified without thyrotoxic crisis or storm: Secondary | ICD-10-CM

## 2022-07-14 DIAGNOSIS — M25612 Stiffness of left shoulder, not elsewhere classified: Secondary | ICD-10-CM

## 2022-07-14 DIAGNOSIS — E0500 Thyrotoxicosis with diffuse goiter without thyrotoxic crisis or storm: Secondary | ICD-10-CM

## 2022-07-14 DIAGNOSIS — Z9889 Other specified postprocedural states: Secondary | ICD-10-CM

## 2022-07-14 DIAGNOSIS — K9041 Non-celiac gluten sensitivity: Secondary | ICD-10-CM | POA: Insufficient documentation

## 2022-07-14 LAB — T4, FREE: Free T4: 0.82 ng/dL (ref 0.60–1.60)

## 2022-07-14 LAB — TSH: TSH: 4.94 u[IU]/mL (ref 0.35–5.50)

## 2022-07-14 MED ORDER — METHIMAZOLE 5 MG PO TABS
5.0000 mg | ORAL_TABLET | ORAL | 3 refills | Status: DC
  Filled 2022-07-14: qty 108, fill #0
  Filled 2022-08-28: qty 108, 90d supply, fill #0
  Filled 2022-12-01: qty 108, 90d supply, fill #1

## 2022-07-14 NOTE — Progress Notes (Signed)
Name: Mackenzie [name]  MRN/ DOB: [id number], 06/12/1970    Age/ Sex: 52 y.o., female    PCP: [name], MD   Reason for Endocrinology Evaluation:  Hyperthyroidism     Date of Initial Endocrinology Evaluation: 07/22/2021    HPI: Mackenzie [name] is a 52 y.o. female with a past medical history of HTN, hx of  left breast Ca . The patient presented for initial endocrinology clinic visit on 07/22/2021 for consultative assistance with her  hyperthyroidism.   Pt has been diagnosed with hyperthyroidism in 05/2021 with a suppressed TSH <0.005 uIU/mL and elevated T4  and T3 at 5.96 and 417 ng/dL respectively.    She has no hx of osteoporosis  Of note, she is s/p left mastectomy due to breast ca, followed by chemo and radiation    Methimazole was started 07/2021 with a suppressed TSH<0.005u IU/mL, elevated free T4 at 5.96 NG/DL and elevated T3 at 417 NG/DL TRAb was elevated at 9.2 IU/L  No fh of thyroid disease    SUBJECTIVE:    Today (07/14/22): Mackenzie [name] is here for follow-up on hyperthyroidism.   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, left ([place]) 01/23/2017   Edema, lower extremity    Healthcare maintenance 03/25/2017   Hot flashes 03/2017   Hyperlipidemia 03/20/2016   Morbid obesity with body mass index (BMI) of 40.0 to 49.9 ([place]) 09/17/2017   Pre-diabetes 07/26/2018   Hgb A1C elevated on 07/26/2018, Gestational Diabetes 2012   Seasonal allergies 2012    seasonal allergies causes allergic rhinitis and itchy, dry eyes per pt   Vitamin D deficiency 05/2015   Past Surgical History:  Past Surgical History:  Procedure Laterality Date   BREAST RECONSTRUCTION WITH PLACEMENT OF TISSUE EXPANDER AND FLEX HD (ACELLULAR HYDRATED DERMIS) Left 08/27/2017   Procedure: LEFT BREAST RECONSTRUCTION WITH PLACEMENT OF TISSUE EXPANDER AND FLEX HD;  Surgeon: Wallace Going, DO;  Location: [place];  Service: Plastics;  Laterality: Left;   CESAREAN SECTION     x2   LATISSIMUS FLAP TO BREAST Left 11/03/2018   LATISSIMUS FLAP TO BREAST Left 11/03/2018   Procedure: LATISSIMUS FLAP TO LEFT BREAST;  Surgeon: Wallace Going, DO;  Location: [place];  Service: Plastics;  Laterality: Left;   MASTECTOMY MODIFIED RADICAL Left 08/27/2017   MASTECTOMY MODIFIED RADICAL Left 08/27/2017   Procedure: LEFT MODIFIED RADICAL MASTECTOMY;  Surgeon: Erroll Luna, MD;  Location: [place];  Service: General;  Laterality: Left;   MASTOPEXY Right 03/03/2019   Procedure: RIGHT BREAST MASTOPEXY/REDUCTION;  Surgeon: Wallace Going, DO;  Location: [place];  Service: Plastics;  Laterality: Right;  3 hours, please   PORT-A-CATH REMOVAL Right 08/27/2017   Procedure: REMOVAL PORT-A-CATH RIGHT CHEST;  Surgeon: Erroll Luna, MD;  Location: [place];  Service: General;  Laterality: Right;   PORTACATH PLACEMENT Right 01/28/2017   Procedure: INSERTION PORT-A-CATH  WITH ULTRASOUND;  Surgeon: Erroll Luna, MD;  Location: [place];  Service: General;  Laterality: Right;   REDUCTION MAMMAPLASTY Right    REMOVAL OF TISSUE EXPANDER AND PLACEMENT OF IMPLANT Left 03/03/2019   Procedure: REMOVAL OF TISSUE EXPANDER AND PLACEMENT OF EXPANDER;  Surgeon: Wallace Going, DO;  Location: [facility];  Service: Plastics;  Laterality: Left;   REMOVAL OF TISSUE EXPANDER AND PLACEMENT OF IMPLANT Left 05/25/2019   Procedure: LEFT BREAST REMOVAL OF TISSUE  EXPANDER AND PLACEMENT OF IMPLANT;  Surgeon: Wallace Going, DO;  Location: [place];  Service: Plastics;  Laterality: Left;   TISSUE EXPANDER PLACEMENT Left 11/03/2018   Procedure: PLACEMENT OF TISSUE EXPANDER LEFT BREAST;  Surgeon: Wallace Going, DO;  Location: [place];  Service: Plastics;  Laterality: Left;  Total case time is 3.5 hours   TUBAL LIGATION Bilateral 01/21/2011    Social History:  reports that she has never smoked. She has never used smokeless tobacco. She reports current alcohol use. She reports that she does not use drugs. Family History: family history includes Breast cancer (age of onset: 3) in her paternal grandmother; Diabetes in her mother and another family member; Hypertension in her mother and another family member; Kidney disease in her father; Other in her mother; Stroke in her mother and another family member.   HOME MEDICATIONS: Allergies as of 07/14/2022       Reactions   Dilaudid [hydromorphone] Itching        Medication List        Accurate as of July 14, 2022  2:47 PM. If you have any questions, ask your nurse or doctor.          acetaminophen 500 MG tablet Commonly known as: TYLENOL Take 1,000 mg by mouth every 6 (six) hours as needed (for pain/headaches.).   calcium carbonate 500 MG chewable tablet Commonly known as: TUMS - dosed in mg elemental calcium Chew 1 tablet by mouth 2 (two) times daily.   celecoxib 200 MG capsule Commonly known as: CELEBREX Take 1 capsule (200 mg total) by mouth 2 (two) times daily. What changed: when to take this   cetirizine 10 MG tablet Commonly known as: ZYRTEC Take 10 mg by mouth daily.   cyclobenzaprine 10 MG tablet Commonly known as: FLEXERIL Take 0.5 tablets (5 mg total) by mouth 2 (two) times daily as needed.   Fish Oil 1000 MG Caps Take 1,000 mg by mouth 2 (two) times daily.   gabapentin 300 MG capsule Commonly known as: NEURONTIN Take 1 capsule (300 mg total) by  mouth 2 (two) times daily.   Insulin Pen Needle 32G X 4 MM Misc Use daily.   letrozole 2.5 MG tablet Commonly known as: FEMARA Take 1 tablet (2.5 mg total) by mouth daily.   loratadine 10 MG tablet Commonly known as: Claritin Take 1 tablet (10 mg total) by mouth daily. What changed:  when to take this reasons to take this   losartan 50 MG tablet Commonly known as: COZAAR Take 1 tablet (50 mg total) by mouth daily.   metFORMIN 500 MG tablet Commonly known as: GLUCOPHAGE Take 1 tablet (500 mg total) by mouth 2 (two) times daily with a meal.   methimazole 5 MG tablet Commonly known as: TAPAZOLE Take 2 tablets (10 mg total) by mouth daily.   metoprolol succinate 50 MG 24 hr tablet Commonly known as: TOPROL-XL Take 1 tablet (50 mg total) by mouth daily. What  changed: how much to take   montelukast 10 MG tablet Commonly known as: SINGULAIR Take 1 tablet (10 mg total) by mouth at bedtime.   multivitamin with minerals Tabs tablet Take 1 tablet by mouth daily.   omeprazole 40 MG capsule Commonly known as: PRILOSEC Take 1 capsule (40 mg total) by mouth daily.   Omron 3 Series BP Monitor Devi Use as directed   ondansetron 4 MG tablet Commonly known as: Zofran Take 1 tablet (4 mg total) by mouth every 8 (eight) hours as needed for nausea or vomiting.   PROBIOTIC DAILY PO Take 1 capsule by mouth 2 (two) times daily.   Rogaine Mens 5 % Foam Generic drug: Minoxidil apply twice daily as directed   rosuvastatin 10 MG tablet Commonly known as: CRESTOR Take 1 tablet (10 mg total) by mouth daily.   spironolactone 25 MG tablet Commonly known as: ALDACTONE Take 1 tablet (25 mg total) by mouth daily.   VITAMIN C PO Take by mouth daily.   ZINC OXIDE PO Take 1 tablet by mouth 2 (two) times a day.          REVIEW OF SYSTEMS: A comprehensive ROS was conducted with the patient and is negative except as per HPI     OBJECTIVE:  VS: BP 122/70 (BP Location: Right  Arm, Patient Position: Sitting, Cuff Size: Large)   Pulse 91   Ht '5\' 5"'$  (1.651 m)   Wt 245 lb (111.1 kg)   LMP 11/28/2016   SpO2 97%   BMI 40.77 kg/m    Wt Readings from Last 3 Encounters:  07/14/22 245 lb (111.1 kg)  07/10/22 238 lb (108 kg)  07/07/22 245 lb 8 oz (111.4 kg)     EXAM: General: Pt appears well and is in NAD  Eyes: External eye exam normal without stare, lid lag or exophthalmos.    Neck:  Thyroid: Thyroid gland is prominent.  No  nodules appreciated.   Lungs: Clear with good BS bilat   Heart: Auscultation: RRR.  Abdomen:  soft, nontender  Extremities:  BL LE: No pretibial edema normal ROM and strength.  Mental Status: Judgment, insight: Intact Orientation: Oriented to time, place, and person Mood and affect: No depression, anxiety, or agitation     DATA REVIEWED:   Latest Reference Range & Units 07/14/22 09:17  TSH 0.35 - 5.50 uIU/mL 4.94  T4,Free(Direct) 0.60 - 1.60 ng/dL 0.82      Latest Reference Range & Units 01/29/22 09:28  Sodium 134 - 144 mmol/L 137  Potassium 3.5 - 5.2 mmol/L 4.5  Chloride 96 - 106 mmol/L 101  CO2 20 - 29 mmol/L 23  Glucose 70 - 99 mg/dL 96  BUN 6 - 24 mg/dL 16  Creatinine 0.57 - 1.00 mg/dL 0.88  Calcium 8.7 - 10.2 mg/dL 9.5  BUN/Creatinine Ratio 9 - 23  18  eGFR >59 mL/min/1.73 79  Alkaline Phosphatase 44 - 121 IU/L 177 (H)  Albumin 3.8 - 4.9 g/dL 4.7  Albumin/Globulin Ratio 1.2 - 2.2  1.6  AST 0 - 40 IU/L 16  ALT 0 - 32 IU/L 15  Total Protein 6.0 - 8.5 g/dL 7.7  Total Bilirubin 0.0 - 1.2 mg/dL 0.3      TRAB <=2.00 IU/L 9.20 High    ASSESSMENT/PLAN/RECOMMENDATIONS:   Hyperthyroidism:   -Patient is clinically euthyroid -No local neck symptoms -TFTs show elevated TSH , will reduce methimazole as below   Medication  Take methimazole 5 mg, 2 tablets on Saturday and Sunday  and 1 tablet Monday through Friday   2.  Graves' disease:   -No extrathyroidal manifestation of Graves' disease   Follow-up in 6  months   Signed electronically by: Mack Guise, MD  [facility]  [facility] [address], [place],  [address] Phone: [phone] FAX: [phone]   CC: [name], MD [place] [address] Phone: [phone] Fax: [phone]   Return to Endocrinology clinic as below: Future Appointments  Date Time Provider [place]  07/16/2022  2:45 PM MC-CT 2 MC-CT [facility]  07/17/2022  9:00 AM Claris Pong, PT [facility] None  07/21/2022  9:15 AM Benay Pike, MD [facility] None  07/21/2022  5:20 PM GI-315 MR 2 GI-315MRI GI-[facility] [facility]  07/21/2022  6:00 PM GI-315 MR 2 GI-315MRI GI-315 [facility] [facility]  08/07/2022  8:30 AM Esaw Grandchild, NP [facility] None  08/29/2022 11:40 AM Clance Boll, PA-C [facility] None  12/15/2022  9:00 AM Suanne Marker, PTA [facility] None  01/13/2023  8:50 AM Yamilette Garretson, Melanie Crazier, MD [facility] None  05/12/2023  8:45 AM Dilling[place], Loel Lofty, DO [facility] None

## 2022-07-14 NOTE — Therapy (Signed)
OUTPATIENT PHYSICAL THERAPY ONCOLOGY TREATMENT  Patient Name: Mackenzie [name] MRN: [id number] DOB:[birth year], 52 y.o., female Today's Date: 07/14/2022  END OF SESSION:  PT End of Session - 07/14/22 1003     Visit Number 11    Number of Visits 16    Date for PT Re-Evaluation 08/01/22    PT Start Time 1005    Activity Tolerance Patient tolerated treatment well    Behavior During Therapy [facility] for tasks assessed/performed              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   Allergic rhinitis 2012   Anemia 01/27/2017   prior to starting chemotherapy   Breast cancer ([place]) 01/14/2017   Left breast   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, left ([place]) 01/23/2017   Edema, lower extremity    Healthcare maintenance 03/25/2017   Hot flashes 03/2017   Hyperlipidemia 03/20/2016   Morbid obesity with body mass index (BMI) of 40.0 to 49.9 ([place]) 09/17/2017   Pre-diabetes 07/26/2018   Hgb A1C elevated on 07/26/2018, Gestational Diabetes 2012   Seasonal allergies 2012   seasonal allergies causes allergic rhinitis and itchy, dry eyes per pt   Vitamin D deficiency 05/2015   Past Surgical History:  Procedure Laterality Date   BREAST RECONSTRUCTION WITH PLACEMENT OF TISSUE EXPANDER AND FLEX HD (ACELLULAR HYDRATED DERMIS) Left 08/27/2017   Procedure: LEFT BREAST RECONSTRUCTION WITH PLACEMENT OF TISSUE EXPANDER AND FLEX HD;  Surgeon: Wallace Going, DO;  Location: [place];  Service: Plastics;  Laterality: Left;   CESAREAN SECTION     x2   LATISSIMUS FLAP TO BREAST Left 11/03/2018   LATISSIMUS FLAP TO BREAST Left 11/03/2018   Procedure: LATISSIMUS FLAP TO LEFT BREAST;  Surgeon: Wallace Going, DO;  Location: [place];  Service: Plastics;  Laterality: Left;   MASTECTOMY MODIFIED RADICAL Left 08/27/2017   MASTECTOMY MODIFIED RADICAL Left 08/27/2017   Procedure: LEFT MODIFIED RADICAL MASTECTOMY;  Surgeon:  Erroll Luna, MD;  Location: [place];  Service: General;  Laterality: Left;   MASTOPEXY Right 03/03/2019   Procedure: RIGHT BREAST MASTOPEXY/REDUCTION;  Surgeon: Wallace Going, DO;  Location: [place];  Service: Plastics;  Laterality: Right;  3 hours, please   PORT-A-CATH REMOVAL Right 08/27/2017   Procedure: REMOVAL PORT-A-CATH RIGHT CHEST;  Surgeon: Erroll Luna, MD;  Location: [place];  Service: General;  Laterality: Right;   PORTACATH PLACEMENT Right 01/28/2017   Procedure: INSERTION PORT-A-CATH WITH ULTRASOUND;  Surgeon: Erroll Luna, MD;  Location: [place];  Service: General;  Laterality: Right;   REDUCTION MAMMAPLASTY Right    REMOVAL OF TISSUE EXPANDER AND PLACEMENT OF IMPLANT Left 03/03/2019   Procedure: REMOVAL OF TISSUE EXPANDER AND PLACEMENT OF EXPANDER;  Surgeon: Wallace Going, DO;  Location: [place];  Service: Plastics;  Laterality: Left;   REMOVAL OF TISSUE EXPANDER AND PLACEMENT OF IMPLANT Left 05/25/2019   Procedure: LEFT BREAST REMOVAL OF TISSUE EXPANDER AND PLACEMENT OF IMPLANT;  Surgeon: Wallace Going, DO;  Location: [place];  Service: Plastics;  Laterality: Left;   TISSUE EXPANDER PLACEMENT Left 11/03/2018   Procedure: PLACEMENT OF TISSUE EXPANDER LEFT BREAST;  Surgeon: Wallace Going, DO;  Location: [place];  Service: Plastics;  Laterality: Left;  Total case time is 3.5 hours   TUBAL LIGATION Bilateral 01/21/2011   Patient Active Problem List   Diagnosis  Date Noted   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   Boil of buttock, Left 12/24/2021   Hyperthyroidism 07/22/2021   Low serum thyroid stimulating hormone (TSH) 06/12/2021   Other fatigue 05/23/2021   Hemoglobin low 05/23/2021   Polyphagia 05/16/2020   Vitamin D deficiency 05/16/2020   At risk for osteoporosis 05/16/2020   Prediabetes 05/01/2020   Elevated blood pressure reading 05/01/2020   At risk for impaired  metabolic function 43/32/9518   Breast asymmetry following reconstructive surgery 09/20/2019   Essential hypertension 08/17/2018   Class 3 severe obesity with serious comorbidity and body mass index (BMI) of 40.0 to 44.9 in adult [facility]) 09/17/2017   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   Malignant neoplasm of upper-inner quadrant of left breast in female, estrogen receptor positive ([place]) 07/27/2017   Healthcare maintenance 03/25/2017   Malignant 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, estrogen receptor positive ([place]) 01/23/2017   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, left ([place]) 01/23/2017    PCP: Horald Pollen, MD  REFERRING PROVIDER: Donnamarie Rossetti PA-C  REFERRING DIAG: Post Mastectomy, Left upper back and UE pain  THERAPY DIAG:  Stiffness of left shoulder, not elsewhere classified  Status post left breast reconstruction  Abnormal posture  ONSET DATE: 11/03/2018 with recent exacerbation  Rationale for Evaluation and Treatment: Rehabilitation  SUBJECTIVE:                                                                                                                                                                                           SUBJECTIVE STATEMENT: Nothing new  PERTINENT HISTORY:  Left breast cancer diagnosed 01/10/17 by mammogram and needle biopsy. Then had neo-adjuvant chemo starting 02/10/17 until 06/26/17; had left mastectomy 08/27/17 with ALND and immediate expander placed. Radiation completed. Expander removed and replaced on May 27, 19 b/c it did not get expanded enough prior to radiation, also had lat flap 11/03/18. Had expander removed and replaced again on 03/03/19 due to a small leak, pt had reduction on R breast at this time   PAIN:  Are you having pain? No ,not presently A little soreness in right side of back today from lifitng [employer]  PRECAUTIONS: Other: left UE lymphedema risk  WEIGHT BEARING RESTRICTIONS:  No  FALLS:  Has patient fallen in last 6 months? No  LIVING ENVIRONMENT: Lives with: her 54 yr old and 90 yr old daughters Lives in: House/apartment Stairs: No; External: 2 steps; none Has following equipment at home: Crutches and bed side commode  OCCUPATION: [employer], [employer] controller  LEISURE: walk, watch movies, spend time with kids.  HAND DOMINANCE: right   PRIOR LEVEL OF FUNCTION: Independent  PATIENT GOALS: Get rid of sharp pain.   OBJECTIVE:  COGNITION: Overall cognitive status: Within functional limits for tasks assessed   PALPATION: Tender left scapular region, lateral trunk, pectorals, very tight UT  OBSERVATIONS / OTHER ASSESSMENTS: no visible swelling however pt with decreased muscle mass noted  left scapular region. Healed incision from lat flap with mild tenderness mid incision. Mild compensation and increased effort noted with left shoulder AROM  SENSATION: Light touch: Appears intact POSTURE: significant forward head, rounded shoulders  UPPER EXTREMITY AROM/PROM:  A/PROM RIGHT   eval   Shoulder extension 55  Shoulder flexion 140  Shoulder abduction 157  Shoulder internal rotation 70  Shoulder external rotation 95    (Blank rows = not tested)  A/PROM LEFT   eval LEFT 06/19/22 LEFT 07/03/2022  Shoulder extension 45    Shoulder flexion 132 158 152  Shoulder abduction 135 156 155  Shoulder internal rotation 54    Shoulder external rotation 75      (Blank rows = not tested)  CERVICAL AROM: All within functional  limits:   UPPER EXTREMITY STRENGTH:  Left grip 40,52,45; avg 45.6 Right grip 50,47,55 avg 50.66  LYMPHEDEMA ASSESSMENTS:  SURGERY TYPE/DATE: Left Modified Radical Mastectomy 08/27/2017, 11/03/2018 Left Lat Flap with tissue expander, 05/25/2019 Removal of expander and placement of implant NUMBER OF LYMPH NODES REMOVED: 1/4 CHEMOTHERAPY: yes neoadjuvant RADIATION:yes HORMONE TREATMENT: yes, Letrozole INFECTIONS: NO  LYMPHEDEMA  ASSESSMENTS:   LANDMARK RIGHT  eval RIGHT  06/10/2022  10 cm proximal to olecranon process 39.5 39.5  Olecranon process '30 30  10 '$ cm proximal to ulnar styloid process 24 25.9  Just proximal to ulnar styloid process 17.5 17.7  Across hand at thumb web space 20 20  At base of 2nd digit 6.4 6.3  (Blank rows = not tested)  LANDMARK LEFT  eval LEFT 06/10/2022  10 cm proximal to olecranon process 39.5 40  Olecranon process 29.'7 31  10 '$ cm proximal to ulnar styloid process 25.5 26.4  Just proximal to ulnar styloid process 18.7 19  Across hand at thumb web space 19.6 20.7  At base of 2nd digit 6.4 6.1  (Blank rows = not tested)    QUICK DASH SURVEY: 31.82   The patient was assessed using the L-Dex machine today to produce a lymphedema index baseline score. The patient will be reassessed on a regular basis (typically every 3 months) to obtain new L-Dex scores. If the score is > 6.5 points away from his/her baseline score indicating onset of subclinical lymphedema, it will be recommended to wear a compression garment for 4 weeks, 12 hours per day and then be reassessed. If the score continues to be > 6.5 points from baseline at reassessment, we will initiate lymphedema treatment. Assessing in this manner has a 95% rate of preventing clinically significant lymphedema.  TODAY'S TREATMENT:  DATE: 07/14/2022 Ball rolls on wall x 10 flexion, 5 for abduction bil Shoulder rolls posterior x 10 Standing shoulder flexon and scaption in front of mirror x10 ea 4 D ball rolls x 10 ea Yellow band ER x 10 standing Yellow band horizontal abd x 10 bil Supine alphabet x 1 Rhythmic stabs at 90 deg. Flexion 3 x 20 sec Serratus punches x 10 STM to left pectorals, lats, UT, and left intercostals and in SL to scapular region and lats in Rt S/L with cocoa butter PROM left shoulder  flex, abd and D2, ER with scapular depression  07/10/2022 Ball rolls on wall x 10 flexion, 5 for abduction Standing shoulder flexon and scaption in front of mirror x10 ea 4 D ball rolls x 10 ea Yellow band ER x 10 standing STM to left pectorals, lats, UT, and left intercostals and in SL to scapular region and lats in Rt S/L with cocoa butter PROM left shoulder flex, abd and D2, ER with scapular depression  Supine alphabet x 1 Rhythmic stabs at 90 deg. Flexion 3 x 20 sec Serratus punches x 10 Supine Bilateral AROM scaption and horizontal abd x 5 ea Updated HEP with standing flex and scaption, supine alphabet, ball rolls on wall  07/08/2022 Overhead pulleys x 1 min flexion and abduction Ball rolls for flexion x 10 4 D ball ball rolls x 10 ea Supine on Half foam roll: Bilateral AROM flexion, scaption and horizontal abd x 5 Jobes flexion and scaption x 5 ea with back against wall STM to left pectorals, lats, UT, and left intercostals and in SL to scapular region and lats in Rt S/L with cocoa butter PROM left shoulder flex, abd and D2, ER with scapular depression   PATIENT EDUCATION:    HOME EXERCISE PROGRAM:  Supine Shoulder Flexion AAROM with Hands Clasped  - 2 x daily - 7 x weekly - 1 sets - 5 reps - 5 hold - Supine Chest Stretch with Arms Behind Head  - 2 x daily - 7 x weekly - 1 sets - 5 reps - 5 hold - Standing Shoulder Abduction Wall Slide with Thumb Out  - 2 x daily - 7 x weekly - 1 sets - 5 reps - 5 hold Person educated: Patient Access Code: [email or web address] URL: [email or web address] Date: 06/12/2022 Prepared by: Collie Siad  Exercises Access Code: [email or web address] URL: [email or web address] Date: 06/19/2022 Prepared by: Manus Gunning  Exercises - Supine Shoulder Flexion AAROM with Hands Clasped  - 2 x daily - 7 x weekly - 1 sets - 5 reps - 5 hold - Supine Chest Stretch with Arms Behind Head  - 2 x daily - 7 x weekly - 1 sets - 5 reps - 5  hold - Standing Shoulder Abduction Wall Slide with Thumb Out  - 2 x daily - 7 x weekly - 1 sets - 5 reps - 5 hold - Latissimus Dorsi Stretch at Wall  - 2-3 x daily - 7 x weekly - 5 reps - 5-10 hold - Supine Lower Trunk Rotation  - 2 x daily - 7 x weekly - 1 sets - 10 reps - 10-20 hold - Shoulder extension with resistance - Neutral  - 1 x daily - 5 x weekly - 1-2 sets - 10 reps - 3 hold - Downward Dog  - 1 x daily - 7 x weekly - 5 reps - 5 hold - Tennis Ball Self Massage on Wall  - 1 x daily - 7 x weekly -  1 sets - 10 reps - Supine Shoulder External Rotation with Resistance  - 1 x daily - 4-5 x weekly - 1 sets - 10 reps - Supine Shoulder Horizontal Abduction with Resistance  - 1 x daily - 4-5 x weekly - 1 sets - 10 reps - Supine narrow and wide grip flexion  - 1 x daily - 4-5 x weekly - 1-2 sets - 10 reps - 3 sec hold - Supine PNF D2   - 1 x daily - 4-5 x weekly - 1-2 sets - 10 reps - 3 sec hold  ASSESSMENT:  CLINICAL IMPRESSION:  Pt is doing very well.  She will most likely have 1 more visit and feels ready for DC most likely.  Would like a final HEP instruction to know what to focus on.  OBJECTIVE IMPAIRMENTS: decreased ROM, decreased strength, impaired UE functional use, postural dysfunction, and pain.   ACTIVITY LIMITATIONS: sleeping, reach over head, and hygiene/grooming  PARTICIPATION LIMITATIONS:  She participates in all with occasional sharp pains  PERSONAL FACTORS: 1-2 comorbidities: Left mastectomy with reconstruction and expanders  are also affecting patient's functional outcome.   REHAB POTENTIAL: Good  CLINICAL DECISION MAKING: Stable/uncomplicated  EVALUATION COMPLEXITY: Low  GOALS: Goals reviewed with patient? Yes LONG TERM GOALS: Target date: 08/01/2022  Pt will have left shoulder abduction 150 for improved reaching and decreased tightness Baseline: 135 Goal status: MET 07/03/2022  2.  Pt will have improved ER on left to 88 degrees for decreased pectoral  tightness Baseline: 75 Goal status: INITIAL  3.  Pt will improve quick dash to no greater than 15% Baseline: 31.82 Goal status: INITIAL  4.  Pt will have decreased tenderness/sharp pain by atleast 50% Baseline:  Goal status: INITIAL  PLAN:  PT FREQUENCY: 1-2x/week  PT DURATION: 8 weeks  PLANNED INTERVENTIONS: Therapeutic exercises, Neuromuscular re-education, Patient/Family education, Self Care, Joint mobilization, Orthotic/Fit training, Dry Needling, and Manual therapy  PLAN FOR NEXT SESSION: scapular stabs,, LTR, pulleys, STM left upper quarter/cupping, DN if warranted,PROM, progress to strength as tolerated, measure circumference of arms Progress postural exercise.  Monitor left arm for lymphedem, SOZO 6 month set up    Cheral Almas, PTA 07/14/22 10:03 AM

## 2022-07-16 ENCOUNTER — Ambulatory Visit (HOSPITAL_COMMUNITY)
Admission: RE | Admit: 2022-07-16 | Discharge: 2022-07-16 | Disposition: A | Discharge status: Home / Self Care | Payer: 59 | Source: Ambulatory Visit | Attending: Cardiology | Admitting: Cardiology

## 2022-07-16 DIAGNOSIS — Z8249 Family history of ischemic heart disease and other diseases of the circulatory system: Secondary | ICD-10-CM | POA: Insufficient documentation

## 2022-07-17 ENCOUNTER — Ambulatory Visit: Payer: 59

## 2022-07-17 DIAGNOSIS — Z9889 Other specified postprocedural states: Secondary | ICD-10-CM

## 2022-07-17 DIAGNOSIS — M25612 Stiffness of left shoulder, not elsewhere classified: Secondary | ICD-10-CM | POA: Diagnosis not present

## 2022-07-17 DIAGNOSIS — R293 Abnormal posture: Secondary | ICD-10-CM | POA: Diagnosis not present

## 2022-07-17 NOTE — Therapy (Signed)
OUTPATIENT PHYSICAL THERAPY ONCOLOGY TREATMENT  Patient Name: Mackenzie [name] MRN: [id number] DOB:[birth year], 52 y.o., female Today's Date: 07/17/2022  END OF SESSION:  PT End of Session - 07/17/22 0906     Visit Number 12    Number of Visits 16    Date for PT Re-Evaluation 08/01/22    PT Start Time 0914   late   PT Stop Time 1000    PT Time Calculation (min) 46 min    Activity Tolerance Patient tolerated treatment well    Behavior During Therapy [facility] for tasks assessed/performed              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   Allergic rhinitis 2012   Anemia 01/27/2017   prior to starting chemotherapy   Breast cancer ([place]) 01/14/2017   Left breast   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, left ([place]) 01/23/2017   Edema, lower extremity    Healthcare maintenance 03/25/2017   Hot flashes 03/2017   Hyperlipidemia 03/20/2016   Morbid obesity with body mass index (BMI) of 40.0 to 49.9 ([place]) 09/17/2017   Pre-diabetes 07/26/2018   Hgb A1C elevated on 07/26/2018, Gestational Diabetes 2012   Seasonal allergies 2012   seasonal allergies causes allergic rhinitis and itchy, dry eyes per pt   Vitamin D deficiency 05/2015   Past Surgical History:  Procedure Laterality Date   BREAST RECONSTRUCTION WITH PLACEMENT OF TISSUE EXPANDER AND FLEX HD (ACELLULAR HYDRATED DERMIS) Left 08/27/2017   Procedure: LEFT BREAST RECONSTRUCTION WITH PLACEMENT OF TISSUE EXPANDER AND FLEX HD;  Surgeon: Wallace Going, DO;  Location: [place];  Service: Plastics;  Laterality: Left;   CESAREAN SECTION     x2   LATISSIMUS FLAP TO BREAST Left 11/03/2018   LATISSIMUS FLAP TO BREAST Left 11/03/2018   Procedure: LATISSIMUS FLAP TO LEFT BREAST;  Surgeon: Wallace Going, DO;  Location: [place];  Service: Plastics;  Laterality: Left;   MASTECTOMY MODIFIED RADICAL Left 08/27/2017   MASTECTOMY MODIFIED RADICAL Left  08/27/2017   Procedure: LEFT MODIFIED RADICAL MASTECTOMY;  Surgeon: Erroll Luna, MD;  Location: [place];  Service: General;  Laterality: Left;   MASTOPEXY Right 03/03/2019   Procedure: RIGHT BREAST MASTOPEXY/REDUCTION;  Surgeon: Wallace Going, DO;  Location: [place];  Service: Plastics;  Laterality: Right;  3 hours, please   PORT-A-CATH REMOVAL Right 08/27/2017   Procedure: REMOVAL PORT-A-CATH RIGHT CHEST;  Surgeon: Erroll Luna, MD;  Location: [place];  Service: General;  Laterality: Right;   PORTACATH PLACEMENT Right 01/28/2017   Procedure: INSERTION PORT-A-CATH WITH ULTRASOUND;  Surgeon: Erroll Luna, MD;  Location: [place];  Service: General;  Laterality: Right;   REDUCTION MAMMAPLASTY Right    REMOVAL OF TISSUE EXPANDER AND PLACEMENT OF IMPLANT Left 03/03/2019   Procedure: REMOVAL OF TISSUE EXPANDER AND PLACEMENT OF EXPANDER;  Surgeon: Wallace Going, DO;  Location: [place];  Service: Plastics;  Laterality: Left;   REMOVAL OF TISSUE EXPANDER AND PLACEMENT OF IMPLANT Left 05/25/2019   Procedure: LEFT BREAST REMOVAL OF TISSUE EXPANDER AND PLACEMENT OF IMPLANT;  Surgeon: Wallace Going, DO;  Location: [place];  Service: Plastics;  Laterality: Left;   TISSUE EXPANDER PLACEMENT Left 11/03/2018   Procedure: PLACEMENT OF TISSUE EXPANDER LEFT BREAST;  Surgeon: Wallace Going, DO;  Location: [place];  Service: Plastics;  Laterality: Left;  Total case time  is 3.5 hours   TUBAL LIGATION Bilateral 01/21/2011   Patient Active Problem List   Diagnosis Date Noted   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   Boil of buttock, Left 12/24/2021   Hyperthyroidism 07/22/2021   Low serum thyroid stimulating hormone (TSH) 06/12/2021   Other fatigue 05/23/2021   Hemoglobin low 05/23/2021   Polyphagia 05/16/2020   Vitamin D deficiency 05/16/2020   At risk for osteoporosis 05/16/2020   Prediabetes 05/01/2020    Elevated blood pressure reading 05/01/2020   At risk for impaired metabolic function 11/94/1740   Breast asymmetry following reconstructive surgery 09/20/2019   Essential hypertension 08/17/2018   Class 3 severe obesity with serious comorbidity and body mass index (BMI) of 40.0 to 44.9 in adult [facility]) 09/17/2017   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   Malignant neoplasm of upper-inner quadrant of left breast in female, estrogen receptor positive ([place]) 07/27/2017   Healthcare maintenance 03/25/2017   Malignant 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, estrogen receptor positive ([place]) 01/23/2017   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, left ([place]) 01/23/2017    PCP: Horald Pollen, MD  REFERRING PROVIDER: Donnamarie Rossetti PA-C  REFERRING DIAG: Post Mastectomy, Left upper back and UE pain  THERAPY DIAG:  Stiffness of left shoulder, not elsewhere classified  Status post left breast reconstruction  Abnormal posture  ONSET DATE: 11/03/2018 with recent exacerbation  Rationale for Evaluation and Treatment: Rehabilitation  SUBJECTIVE:                                                                                                                                                                                           SUBJECTIVE STATEMENT: Feeling pretty good. Had a litle pain yesterday in the back area when I reached down to pull a bag up. Frequency of pain is about the same but the intensity seems to be less  PERTINENT HISTORY:  Left breast cancer diagnosed 01/10/17 by mammogram and needle biopsy. Then had neo-adjuvant chemo starting 02/10/17 until 06/26/17; had left mastectomy 08/27/17 with ALND and immediate expander placed. Radiation completed. Expander removed and replaced on May 27, 19 b/c it did not get expanded enough prior to radiation, also had lat flap 11/03/18. Had expander removed and replaced again on 03/03/19 due to a small leak, pt had reduction  on R breast at this time   PAIN:  Are you having pain? No ,  PRECAUTIONS: Other: left UE lymphedema risk  WEIGHT BEARING RESTRICTIONS: No  FALLS:  Has patient fallen in last 6 months? No  LIVING ENVIRONMENT: Lives with: her 54 yr old and 67 yr old daughters  Lives in: House/apartment Stairs: No; External: 2 steps; none Has following equipment at home: Crutches and bed side commode  OCCUPATION: [employer], Financial controller  LEISURE: walk, watch movies, spend time with kids.  HAND DOMINANCE: right   PRIOR LEVEL OF FUNCTION: Independent  PATIENT GOALS: Get rid of sharp pain.   OBJECTIVE:  COGNITION: Overall cognitive status: Within functional limits for tasks assessed   PALPATION: Tender left scapular region, lateral trunk, pectorals, very tight UT  OBSERVATIONS / OTHER ASSESSMENTS: no visible swelling however pt with decreased muscle mass noted  left scapular region. Healed incision from lat flap with mild tenderness mid incision. Mild compensation and increased effort noted with left shoulder AROM  SENSATION: Light touch: Appears intact POSTURE: significant forward head, rounded shoulders  UPPER EXTREMITY AROM/PROM:  A/PROM RIGHT   eval   Shoulder extension 55  Shoulder flexion 140  Shoulder abduction 157  Shoulder internal rotation 70  Shoulder external rotation 95    (Blank rows = not tested)  A/PROM LEFT   eval LEFT 06/19/22 LEFT 07/03/2022  Shoulder extension 45    Shoulder flexion 132 158 152  Shoulder abduction 135 156 155  Shoulder internal rotation 54    Shoulder external rotation 75      (Blank rows = not tested)  CERVICAL AROM: All within functional  limits:   UPPER EXTREMITY STRENGTH:  Left grip 40,52,45; avg 45.6 Right grip 50,47,55 avg 50.66  LYMPHEDEMA ASSESSMENTS:  SURGERY TYPE/DATE: Left Modified Radical Mastectomy 08/27/2017, 11/03/2018 Left Lat Flap with tissue expander, 05/25/2019 Removal of expander and placement of  implant NUMBER OF LYMPH NODES REMOVED: 1/4 CHEMOTHERAPY: yes neoadjuvant RADIATION:yes HORMONE TREATMENT: yes, Letrozole INFECTIONS: NO  LYMPHEDEMA ASSESSMENTS:   LANDMARK RIGHT  eval RIGHT  06/10/2022  10 cm proximal to olecranon process 39.5 39.5  Olecranon process '30 30  10 '$ cm proximal to ulnar styloid process 24 25.9  Just proximal to ulnar styloid process 17.5 17.7  Across hand at thumb web space 20 20  At base of 2nd digit 6.4 6.3  (Blank rows = not tested)  LANDMARK LEFT  eval LEFT 06/10/2022  10 cm proximal to olecranon process 39.5 40  Olecranon process 29.'7 31  10 '$ cm proximal to ulnar styloid process 25.5 26.4  Just proximal to ulnar styloid process 18.7 19  Across hand at thumb web space 19.6 20.7  At base of 2nd digit 6.4 6.1  (Blank rows = not tested)    QUICK DASH SURVEY: 31.82   The patient was assessed using the L-Dex machine today to produce a lymphedema index baseline score. The patient will be reassessed on a regular basis (typically every 3 months) to obtain new L-Dex scores. If the score is > 6.5 points away from his/her baseline score indicating onset of subclinical lymphedema, it will be recommended to wear a compression garment for 4 weeks, 12 hours per day and then be reassessed. If the score continues to be > 6.5 points from baseline at reassessment, we will initiate lymphedema treatment. Assessing in this manner has a 95% rate of preventing clinically significant lymphedema.  TODAY'S TREATMENT:  DATE:  07/17/2022 STM to left pectorals, lats, UT, and left intercostals and in SL to scapular region and lats in Rt S/L with cupping in addition with cocoa butter PROM left shoulder flex, abd and D2, ER with scapular depression Supine wand x 5 flexion and scaption Supine alphabet x 1, SL alphabet 1# x 1 Rhythmic stabs at 90  deg. Flexion 1 x 20 sec, abd 1 x 20 Supine horizontal abduction yellow x 10 Supine sword yellow 2 x 5 Standing scapular retraction and extension yellow x 10 07/14/2022 Ball rolls on wall x 10 flexion, 5 for abduction bil Shoulder rolls posterior x 10 Standing shoulder flexon and scaption in front of mirror x10 ea 4 D ball rolls x 10 ea Yellow band ER x 10 standing Yellow band horizontal abd x 10 bil Supine alphabet x 1 Rhythmic stabs at 90 deg. Flexion 3 x 20 sec Serratus punches x 10 STM to left pectorals, lats, UT, and left intercostals and in SL to scapular region and lats in Rt S/L with cocoa butter PROM left shoulder flex, abd and D2, ER with scapular depression  07/10/2022 Ball rolls on wall x 10 flexion, 5 for abduction Standing shoulder flexon and scaption in front of mirror x10 ea 4 D ball rolls x 10 ea Yellow band ER x 10 standing STM to left pectorals, lats, UT, and left intercostals and in SL to scapular region and lats in Rt S/L with cocoa butter PROM left shoulder flex, abd and D2, ER with scapular depression  Supine alphabet x 1 Rhythmic stabs at 90 deg. Flexion 3 x 20 sec Serratus punches x 10 Supine Bilateral AROM scaption and horizontal abd x 5 ea Updated HEP with standing flex and scaption, supine alphabet, ball rolls on wall  07/08/2022 Overhead pulleys x 1 min flexion and abduction Ball rolls for flexion x 10 4 D ball ball rolls x 10 ea Supine on Half foam roll: Bilateral AROM flexion, scaption and horizontal abd x 5 Jobes flexion and scaption x 5 ea with back against wall STM to left pectorals, lats, UT, and left intercostals and in SL to scapular region and lats in Rt S/L with cocoa butter PROM left shoulder flex, abd and D2, ER with scapular depression   PATIENT EDUCATION:    HOME EXERCISE PROGRAM: Access Code: [email or web address] URL: [email or web address] Date: 07/17/2022 Prepared by: Cheral Almas  Exercises - Scapular Retraction with  Resistance  - 1 x daily - 3 x weekly - 1 sets - 10 reps - Shoulder Extension with Resistance  - 1 x daily - 3 x weekly - 1 sets - 10 reps - Shoulder External Rotation and Scapular Retraction with Resistance  - 1 x daily - 3 x weekly - 1 sets - 10 reps  Supine Shoulder Flexion AAROM with Hands Clasped  - 2 x daily - 7 x weekly - 1 sets - 5 reps - 5 hold - Supine Chest Stretch with Arms Behind Head  - 2 x daily - 7 x weekly - 1 sets - 5 reps - 5 hold - Standing Shoulder Abduction Wall Slide with Thumb Out  - 2 x daily - 7 x weekly - 1 sets - 5 reps - 5 hold Person educated: Patient Access Code: [email or web address] URL: [email or web address] Date: 06/12/2022 Prepared by: Collie Siad  Exercises Access Code: [email or web address] URL: [email or web address] Date: 06/19/2022 Prepared by: Manus Gunning  Exercises - Supine Shoulder Flexion AAROM with Hands Clasped  - 2 x daily - 7 x  weekly - 1 sets - 5 reps - 5 hold - Supine Chest Stretch with Arms Behind Head  - 2 x daily - 7 x weekly - 1 sets - 5 reps - 5 hold - Standing Shoulder Abduction Wall Slide with Thumb Out  - 2 x daily - 7 x weekly - 1 sets - 5 reps - 5 hold - Latissimus Dorsi Stretch at Wall  - 2-3 x daily - 7 x weekly - 5 reps - 5-10 hold - Supine Lower Trunk Rotation  - 2 x daily - 7 x weekly - 1 sets - 10 reps - 10-20 hold - Shoulder extension with resistance - Neutral  - 1 x daily - 5 x weekly - 1-2 sets - 10 reps - 3 hold - Downward Dog  - 1 x daily - 7 x weekly - 5 reps - 5 hold - Tennis Ball Self Massage on Wall  - 1 x daily - 7 x weekly - 1 sets - 10 reps - Supine Shoulder External Rotation with Resistance  - 1 x daily - 4-5 x weekly - 1 sets - 10 reps - Supine Shoulder Horizontal Abduction with Resistance  - 1 x daily - 4-5 x weekly - 1 sets - 10 reps - Supine narrow and wide grip flexion  - 1 x daily - 4-5 x weekly - 1-2 sets - 10 reps - 3 sec hold - Supine PNF D2   - 1 x daily - 4-5 x weekly - 1-2 sets  - 10 reps - 3 sec hold  ASSESSMENT:  CLINICAL IMPRESSION:  Pt is doing well with decreased intensity of pain when present, but still happening as often. Pt is improved with ROM and compliant with exercises. She does still fatigue with exercises and exhibits some UT compensation..  OBJECTIVE IMPAIRMENTS: decreased ROM, decreased strength, impaired UE functional use, postural dysfunction, and pain.   ACTIVITY LIMITATIONS: sleeping, reach over head, and hygiene/grooming  PARTICIPATION LIMITATIONS:  She participates in all with occasional sharp pains  PERSONAL FACTORS: 1-2 comorbidities: Left mastectomy with reconstruction and expanders  are also affecting patient's functional outcome.   REHAB POTENTIAL: Good  CLINICAL DECISION MAKING: Stable/uncomplicated  EVALUATION COMPLEXITY: Low  GOALS: Goals reviewed with patient? Yes LONG TERM GOALS: Target date: 08/01/2022  Pt will have left shoulder abduction 150 for improved reaching and decreased tightness Baseline: 135 Goal status: MET 07/03/2022  2.  Pt will have improved ER on left to 88 degrees for decreased pectoral tightness Baseline: 75 Goal status: INITIAL  3.  Pt will improve quick dash to no greater than 15% Baseline: 31.82 Goal status: INITIAL  4.  Pt will have decreased tenderness/sharp pain by atleast 50% Baseline:  Goal status: INITIAL  PLAN:  PT FREQUENCY: 1-2x/week  PT DURATION: 8 weeks  PLANNED INTERVENTIONS: Therapeutic exercises, Neuromuscular re-education, Patient/Family education, Self Care, Joint mobilization, Orthotic/Fit training, Dry Needling, and Manual therapy  PLAN FOR NEXT SESSION: DC or continue?,scapular stabs,, LTR, STM left upper quarter/cupping, DN if warranted,PROM, progress to strength as tolerated, measure circumference of arms ,  Monitor left arm for lymphedema,     Cheral Almas, PTA 07/17/22 10:12 AM

## 2022-07-21 ENCOUNTER — Ambulatory Visit
Admission: RE | Admit: 2022-07-21 | Discharge: 2022-07-21 | Disposition: A | Discharge status: Home / Self Care | Payer: 59 | Source: Ambulatory Visit | Attending: Neurology | Admitting: Neurology

## 2022-07-21 ENCOUNTER — Other Ambulatory Visit: Payer: 59

## 2022-07-21 ENCOUNTER — Inpatient Hospital Stay: Payer: 59 | Attending: Adult Health | Admitting: Hematology and Oncology

## 2022-07-21 ENCOUNTER — Encounter: Payer: Self-pay | Admitting: *Deleted

## 2022-07-21 VITALS — BP 117/68 | HR 88 | Temp 98.0°F | Resp 16 | Ht 65.0 in | Wt 244.0 lb

## 2022-07-21 DIAGNOSIS — H5310 Unspecified subjective visual disturbances: Secondary | ICD-10-CM

## 2022-07-21 DIAGNOSIS — Z170 Estrogen receptor positive status [ER+]: Secondary | ICD-10-CM

## 2022-07-21 DIAGNOSIS — C50212 Malignant neoplasm of upper-inner quadrant of left female breast: Secondary | ICD-10-CM | POA: Diagnosis not present

## 2022-07-21 DIAGNOSIS — H4710 Unspecified papilledema: Secondary | ICD-10-CM

## 2022-07-21 DIAGNOSIS — C50912 Malignant neoplasm of unspecified site of left female breast: Secondary | ICD-10-CM

## 2022-07-21 DIAGNOSIS — Z006 Encounter for examination for normal comparison and control in clinical research program: Secondary | ICD-10-CM | POA: Diagnosis not present

## 2022-07-21 MED ORDER — GADOPICLENOL 0.5 MMOL/ML IV SOLN
10.0000 mL | Freq: Once | INTRAVENOUS | Status: AC | PRN
  Administered 2022-07-21: 10 mL via INTRAVENOUS

## 2022-07-21 NOTE — Research (Addendum)
[name] - Visit 23  [name] arrived today for her scheduled Visit 23 on the [name] protocol.  PROs:  Provided to patient at registration. Collected and reviewed for accuracy and completeness.   Healthcare Resource Utilization: Patient did not have any ED visits or hospitalizations since last study contact. She did not have any visits in our clinic for any issues related to her breast cancer or treatment since her last study visit.   Serious Adverse Events:  No SAEs since last study contact.     H & P and Clinical Eval for Recurrence:  See Dr. Remonia Richter note dated today.   Endocrine Treatment: Patient returned her completed medication diaries for past 6 months of Letrozole. Patient reports 5 missed doses in the past 6 months. Patient states she forgot to take her dose 3 of those days due to various reasons. Patient also missed 2 doses due to 2 days of nausea, vomiting and diarrhea during which she was unable to keep food or liquid down.  Reviewed strategies to remember daily doses. Patient verbalized understanding of importance of taking Letrozole daily as prescribed. She was provided with 6 more months of drug diaries for ongoing documentation of letrozole intake starting today. Encouraged patient to continue taking daily as prescribed. She verbalized understanding.  Plan- Next mammogram is due at the end of May.  Reminded patient to make appointment at [facility] for 11/07/22 or as soon afterwards as possible. She verbalized understanding.   Visit Scheduling - Patient will return to clinic in 6 months on 01/05/23 for study visit with research nurse and MD. Research nurse will also call her in 3 months to conduct study visit by phone.   Patient aware that she should contact the clinic at any time if she has any questions or concerns prior to her next visit.  Patient thanked for her ongoing participation in the [place] trial.  [name] Reamer, BSN, RN, [facility]  Nurse II [phone] 07/21/2022

## 2022-07-21 NOTE — Progress Notes (Signed)
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  Mackenzie [name] is on study with [name]-- taking Letrozole daily.  She completed 3 years of adjuvant ribociclib Her most recent mammogram May 2023, no findings suspicious for malignancy. Last bone density in October 2022 showed a T-score of -1.1, this showed osteopenia/low bone mass. We deemed you as postmenopausal since her labs are consistent with menopause on multiple occasions. Since her last visit here, she had some blurred vision and floaters in the left eye hence went to see an ophthalmologist which showed some unilateral papilledema.  She is now being followed by Dr. Mariea Stable from neurology who had ordered MRI brain and MRI orbits given the unilateral papilledema.  She had that scheduled for later today but apparently got a message this morning that this needs to be rescheduled.  Asked her to reschedule this to the earliest if possible.  Besides the symptoms, she denies any new changes.  No changes in the breast report. Rest of the pertinent 10 point ROS reviewed and negative  REVIEW OF SYSTEMS: Review of Systems  Constitutional:   Negative for appetite change, chills, fatigue, fever and unexpected weight change.  HENT:   Negative for hearing loss, lump/mass and trouble swallowing.   Eyes:  Positive for eye problems. Negative for icterus.  Respiratory:  Negative for chest tightness, cough and shortness of breath.   Cardiovascular:  Negative for chest pain, leg swelling and palpitations.  Gastrointestinal:  Negative for abdominal distention, abdominal pain, constipation, diarrhea, nausea and vomiting.  Endocrine: Negative for hot flashes.  Genitourinary:  Negative for difficulty urinating.   Musculoskeletal:  Negative for arthralgias.  Skin:  Negative for itching and rash.  Neurological:  Negative for dizziness, extremity weakness, headaches and numbness.  Hematological:  Negative for adenopathy. Does not bruise/bleed easily.  Psychiatric/Behavioral:  Negative for depression. The patient is not nervous/anxious.       COVID 19 VACCINATION STATUS: Status post Pfizer x2 with booster September 2021   HISTORY OF CURRENT ILLNESS: From the original intake note:  The patient herself noted some changes in her left breast late July 2018, she says, and eventually brought this to medical attention so that on 01/09/2017 she underwent bilateral diagnostic mammography with tomography and left breast ultrasonography at the breast Center. This found the breast density to be category C. In the upper inner quadrant of the left breast there was an area of asymmetry and there were malignant type calcifications involving all 4 quadrants. On exam there is firmness and palpable thickening in the anterior left breast with skin dimpling. Ultrasonography found at the 9:30 o'clock radiant 3 cm from the nipple a 2.7 cm mass and in the left axilla for abnormal lymph nodes largest of which measured 2.7 cm.  Biopsy of the  left breast 9:30 o'clock mass 80 01/26/2017 showed (SAA [id number]) invasive ductal carcinoma, with extracellular mucin, grade 1 or 2.  In the lower outer left breast is separated biopsy the same day showed ductal carcinoma in situ. One of the 4 lymph nodes involved was positive for metastatic carcinoma. Prognostic panel on the invasive disease showed it to be estrogen receptor 100% positive, progesterone receptor 20% positive, both with strong staining intensity, with an MIB-1 of 20%, and no HER-2 amplification with a signals ratio 1.38 and the number per cell 2.90.  On 01/22/2017 the patient underwent bilateral breast MRI. This showed no involvement of the right breast, but in the left breast there was a masslike and non-masslike enhancement involving all quadrants, with skin swelling but no abnormal enhancement of the skin or nipple areolar complex or pectoralis muscle. The mass could not be clearly measured but spanned approximately 12 cm. There was bulky left axillary lymphadenopathy, with the largest lymph node measuring up to 3.4 cm.  The patient's subsequent history is as detailed below.   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, left ([place]) 01/23/2017   Edema, lower extremity    Healthcare maintenance 03/25/2017   Hot flashes 03/2017   Hyperlipidemia 03/20/2016   Morbid obesity with body mass index (BMI) of 40.0 to 49.9 ([place]) 09/17/2017   Pre-diabetes 07/26/2018   Hgb A1C elevated on 07/26/2018, Gestational Diabetes 2012   Seasonal allergies 2012   seasonal allergies causes allergic rhinitis and itchy, dry eyes per pt   Vitamin D deficiency 05/2015    PAST SURGICAL HISTORY: Past Surgical History:  Procedure Laterality Date   BREAST RECONSTRUCTION WITH PLACEMENT OF TISSUE EXPANDER AND FLEX HD (ACELLULAR HYDRATED DERMIS) Left 08/27/2017    Procedure: LEFT BREAST RECONSTRUCTION WITH PLACEMENT OF TISSUE EXPANDER AND FLEX HD;  Surgeon: Wallace Going, DO;  Location: [facility];  Service: Plastics;  Laterality: Left;   CESAREAN SECTION     x2   LATISSIMUS FLAP TO BREAST Left 11/03/2018   LATISSIMUS FLAP TO BREAST Left 11/03/2018   Procedure: LATISSIMUS FLAP TO LEFT BREAST;  Surgeon: Wallace Going, DO;  Location: [facility];  Service: Plastics;  Laterality: Left;   MASTECTOMY MODIFIED RADICAL Left 08/27/2017   MASTECTOMY MODIFIED RADICAL Left 08/27/2017   Procedure: LEFT MODIFIED RADICAL MASTECTOMY;  Surgeon: Erroll Luna, MD;  Location: [facility];  Service: General;  Laterality: Left;   MASTOPEXY Right 03/03/2019   Procedure: RIGHT BREAST MASTOPEXY/REDUCTION;  Surgeon: Wallace Going, DO;  Location: [facility];  Service: Plastics;  Laterality: Right;  3 hours, please   PORT-A-CATH REMOVAL Right 08/27/2017   Procedure: REMOVAL PORT-A-CATH RIGHT CHEST;  Surgeon: Erroll Luna, MD;  Location: [facility];  Service: General;  Laterality: Right;   PORTACATH PLACEMENT Right 01/28/2017   Procedure: INSERTION PORT-A-CATH WITH ULTRASOUND;  Surgeon: Erroll Luna, MD;  Location: [facility];  Service: General;  Laterality: Right;   REDUCTION MAMMAPLASTY Right    REMOVAL OF TISSUE EXPANDER AND PLACEMENT OF IMPLANT Left 03/03/2019   Procedure: REMOVAL OF TISSUE EXPANDER AND PLACEMENT OF EXPANDER;  Surgeon: Wallace Going, DO;  Location: [facility];  Service: Plastics;  Laterality: Left;  REMOVAL OF TISSUE EXPANDER AND PLACEMENT OF IMPLANT Left 05/25/2019   Procedure: LEFT BREAST REMOVAL OF TISSUE EXPANDER AND PLACEMENT OF IMPLANT;  Surgeon: Wallace Going, DO;  Location: [facility];  Service: Plastics;  Laterality: Left;   TISSUE EXPANDER PLACEMENT Left 11/03/2018   Procedure: PLACEMENT OF TISSUE EXPANDER LEFT BREAST;  Surgeon: Wallace Going, DO;  Location: [facility];   Service: Plastics;  Laterality: Left;  Total case time is 3.5 hours   TUBAL LIGATION Bilateral 01/21/2011    FAMILY HISTORY Family History  Problem Relation Age of Onset   Other Mother        [id number] from complications of surgery to remove brain tumor   Diabetes Mother    Hypertension Mother    Stroke Mother    Kidney disease Father    Breast cancer Paternal Grandmother 86       d.60s from breast cancer. Did not have treatment.   Hypertension Other    Diabetes Other    Stroke Other   The patient's father still alive at age 71. The patient's mother died with a brain tumor which the patient says was "benign". She was 56. The patient has one brother, no sisters. The only breast cancer in the family is a paternal grandmother who died from breast cancer at an unknown age. There is no history of ovarian or prostate cancer in the family.   GYNECOLOGIC HISTORY:  Patient's last menstrual period was 11/28/2016. Menarche age 38 and first live birth age 46 she is [place] P4. The patient is still having regular periods as of August 2018   SOCIAL HISTORY: (Updated 05/31/2018) Cleone works at [employer] on the fifth floor, [place]. She is separated from her husband (as of 05/2018), [name], who is a Building control surveyor.  1 daughter is married and lives in [place] and works as a Education administrator. Mikhayla's second daughter, [name], lives in [place] and works in the police department. Daughters, [place] and [place] are 59 and 6, living at home. The patient has a grandson she looks after. She attends a [employer].     ADVANCED DIRECTIVES: At the 05/03/2020 visit the patient tells me she has named her daughter [name] as her healthcare power of attorney.  [name] may be reached at [place]: Social History   Tobacco Use   Smoking status: Never   Smokeless tobacco: Never  Vaping Use   Vaping Use: Never used  Substance Use Topics   Alcohol use: Yes    Comment: social    Drug use: No    Colonoscopy: Never  PAP: December 2017  Bone density: Never   Allergies  Allergen Reactions   Dilaudid [Hydromorphone] Itching    Current Outpatient Medications  Medication Sig Dispense Refill   acetaminophen (TYLENOL) 500 MG tablet Take 1,000 mg by mouth every 6 (six) hours as needed (for pain/headaches.).      Ascorbic Acid (VITAMIN C PO) Take by mouth daily.     Blood Pressure Monitoring (OMRON 3 SERIES BP MONITOR) DEVI Use as directed 1 each 0   calcium carbonate (TUMS - DOSED IN MG ELEMENTAL CALCIUM) 500 MG chewable tablet Chew 1 tablet by mouth 2 (two) times daily.     celecoxib (CELEBREX) 200 MG capsule Take 1 capsule (200 mg total) by mouth 2 (two) times daily. (Patient taking differently: Take 200 mg by mouth daily.) 30 capsule 3   cetirizine (ZYRTEC) 10 MG tablet Take 10 mg by mouth daily.  cyclobenzaprine (FLEXERIL) 10 MG tablet Take 0.5 tablets (5 mg total) by mouth 2 (two) times daily as needed. 30 tablet 0   gabapentin (NEURONTIN) 300 MG capsule Take 1 capsule (300 mg total) by mouth 2 (two) times daily. 180 capsule 0   Insulin Pen Needle 32G X 4 MM MISC Use daily. 100 each 1   letrozole (FEMARA) 2.5 MG tablet Take 1 tablet (2.5 mg total) by mouth daily. 90 tablet 4   loratadine (CLARITIN) 10 MG tablet Take 1 tablet (10 mg total) by mouth daily. (Patient taking differently: Take 10 mg by mouth daily as needed for allergies.) 90 tablet 2   losartan (COZAAR) 50 MG tablet Take 1 tablet (50 mg total) by mouth daily. 90 tablet 0   metFORMIN (GLUCOPHAGE) 500 MG tablet Take 1 tablet (500 mg total) by mouth 2 (two) times daily with a meal. 180 tablet 0   methimazole (TAPAZOLE) 5 MG tablet Take 1 tablet daily Monday through Friday and 2 tablets on Saturday and Sundays as directed 108 tablet 3   metoprolol succinate (TOPROL-XL) 50 MG 24 hr tablet Take 1 tablet (50 mg total) by mouth daily. (Patient taking differently: Take 25 mg by mouth daily.) 90 tablet 0    Minoxidil (ROGAINE MENS) 5 % FOAM apply twice daily as directed 180 g 0   montelukast (SINGULAIR) 10 MG tablet Take 1 tablet (10 mg total) by mouth at bedtime. 90 tablet 0   Multiple Vitamin (MULTIVITAMIN WITH MINERALS) TABS tablet Take 1 tablet by mouth daily.      Omega-3 Fatty Acids (FISH OIL) 1000 MG CAPS Take 1,000 mg by mouth 2 (two) times daily.      omeprazole (PRILOSEC) 40 MG capsule Take 1 capsule (40 mg total) by mouth daily. 90 capsule 1   ondansetron (ZOFRAN) 4 MG tablet Take 1 tablet (4 mg total) by mouth every 8 (eight) hours as needed for nausea or vomiting. 20 tablet 0   Probiotic Product (PROBIOTIC DAILY PO) Take 1 capsule by mouth 2 (two) times daily.      rosuvastatin (CRESTOR) 10 MG tablet Take 1 tablet (10 mg total) by mouth daily. 90 tablet 0   spironolactone (ALDACTONE) 25 MG tablet Take 1 tablet (25 mg total) by mouth daily. 90 tablet 0   ZINC OXIDE PO Take 1 tablet by mouth 2 (two) times a day.     No current facility-administered medications for this visit.   Facility-Administered Medications Ordered in Other Visits  Medication Dose Route Frequency Provider Last Rate Last Admin   sodium chloride flush (NS) 0.9 % injection 10 mL  10 mL Intravenous PRN Magrinat, [name], MD   10 mL at 03/10/17 1239    OBJECTIVE: African-American woman in no acute distress  There were no vitals filed for this visit.  Wt Readings from Last 3 Encounters:  07/14/22 245 lb (111.1 kg)  07/10/22 238 lb (108 kg)  07/07/22 245 lb 8 oz (111.4 kg)   There is no height or weight on file to calculate BMI.   ECOG FS: 1  Physical Exam Constitutional:      Appearance: Normal appearance.  Cardiovascular:     Rate and Rhythm: Normal rate and regular rhythm.  Pulmonary:     Effort: Pulmonary effort is normal.     Breath sounds: Normal breath sounds.  Chest:     Comments: Left breast status postmastectomy.  No concern for recurrence.  Right breast normal to inspection and palpation.  No  regional adenopathy Musculoskeletal:        General: Normal range of motion.     Cervical back: Normal range of motion and neck supple. No rigidity.  Lymphadenopathy:     Cervical: No cervical adenopathy.  Skin:    General: Skin is warm and dry.  Neurological:     General: No focal deficit present.     Mental Status: She is alert.      LAB RESULTS:  No visits with results within 3 Day(s) from this visit.  Latest known visit with results is:  Office Visit on 07/14/2022  Component Date Value Ref Range Status   Free T4 07/14/2022 0.82  0.60 - 1.60 ng/dL Final   Comment: Specimens from patients who are undergoing biotin therapy and /or ingesting biotin supplements may contain high levels of biotin.  The higher biotin concentration in these specimens interferes with this Free T4 assay.  Specimens that contain high levels  of biotin may cause false high results for this Free T4 assay.  Please interpret results in light of the total clinical presentation of the patient.     TSH 07/14/2022 4.94  0.35 - 5.50 uIU/mL Final     STUDIES: CT CARDIAC SCORING (SELF PAY ONLY)  Addendum Date: 07/18/2022   ADDENDUM REPORT: 07/18/2022 00:38 EXAM: OVER-READ INTERPRETATION  CT CHEST The following report is an over-read performed by radiologist Dr. Collene Leyden [facility] on 07/18/2022. This over-read does not include interpretation of cardiac or coronary anatomy or pathology. The coronary calcium score interpretation by the cardiologist is attached. COMPARISON:  04/07/2021 FINDINGS: Heart is normal size. Aorta normal caliber. No adenopathy. No confluent opacities or effusions. No acute findings in the upper abdomen. Small hiatal hernia. Chest wall soft tissues are unremarkable. Left breast implant partially visualized. No acute bony abnormality. IMPRESSION: Small hiatal hernia. No acute extra cardiac abnormality. Electronically Signed   By: Rolm Baptise M.D.   On: 07/18/2022 00:38   Result  Date: 07/18/2022 CLINICAL DATA:  Risk stratification EXAM: Coronary Calcium Score TECHNIQUE: The patient was scanned on a Siemens Somatom 64 slice scanner. Axial non-contrast 34m slices were carried out through the heart. The data set was analyzed on a dedicated work station and scored using the [place] FINDINGS: Non-cardiac: No significant non cardiac findings on limited lung and soft tissue windows. [facility]. Ascending Aorta: Upper limits of normal 3.7 cm Pericardium: Normal Coronary arteries: No calcium noted IMPRESSION: Coronary calcium score of 0. PJenkins RougeElectronically Signed: By: PJenkins RougeM.D. On: 07/16/2022 15:40     ELIGIBLE FOR AVAILABLE RESEARCH PROTOCOL: [name], ASA study   ASSESSMENT: 52y.o. [place] woman status post left breast upper inner quadrant biopsy 01/14/2017 for a clinical T3 N2, stage IIA invasive ductal carcinoma, grade 1 or 2, estrogen and progesterone receptor positive, HER-2 nonamplified, with an MIB-1 of 20%.  (1) staging studies: Brain MRI, bone scan, and CT scan of the chest 02/05/2017 showed no brain lesions, no lung or liver lesions, a 4.9 cm mass in the left breast with left axillary and subpectoral adenopathy, and nonspecific bone scan tracer at L2, left scapula, and anterior ribs, with lumbar spine MRI suggested for further evaluation.  (a) lumbar spine MRI 02/17/2017 showed no normal bone lesions.  There was mild lumbar spondylosis  (2) neoadjuvant chemotherapy consisting of cyclophosphamide and doxorubicin in dose dense fashion 4 started 02/10/2017, completed 03/24/2017, followed by weekly carboplatin and gemcitabine given days 1 and 8 of  each 21-day cycle starting 04/14/2017, completing the planned 4 cycles 06/26/2017  (3) is post left modified radical mastectomy on 08/27/2017 showing an mpT3 pN2 residual invasive ductal carcinoma, grade 2, with a residual cancer burden of 3.  Margins were clear  (a)  latissimus flap reconstruction with expander 11/03/2018  (4) postmastectomy radiation completed 01/01/2018  (a) capecitabine radiosensitization 11/16/2017-01/01/2018  (5) goserelin started 09/21/2017  (a) letrozole started 01/11/2018  (b) enrolled in [place] clinical trial, randomized to ribociclib on 05/31/2018  (c) Bone density on 03/16/2018 was normal with T score of -0.7 in the L1-L4 spine  (6) genetics testing 03/24/2017 through the Common Hereditary Cancer Panel offered by [facility] found no deleterious mutations in APC, ATM, AXIN2, BARD1, BMPR1A, BRCA1, BRCA2, BRIP1, CDH1, CDKN2A (p14ARF), CDKN2A (p16INK4a), CHEK2, CTNNA1, DICER1, EPCAM (Deletion/duplication testing only), GREM1 (promoter region deletion/duplication testing only), KIT, MEN1, MLH1, MSH2, MSH3, MSH6, MUTYH, NBN, NF1, NHTL1, PALB2, PDGFRA, PMS2, POLD1, POLE, PTEN, RAD50, RAD51C, RAD51D, SDHB, SDHC, SDHD, SMAD4, SMARCA4. STK11, TP53, TSC1, TSC2, and VHL.  The following genes were evaluated for sequence changes only: SDHA and HOXB13 c.251G>A variant only.    (7) Right breast reduction and left breast expander placement on 03/03/2019 with latissimus flap placement  (a) left implant implant exchanged on 05/25/2019 or a [place]or Smooth Round Ultra High Profile Gel 750cc. Ref [id number].  Serial Number [id number]   PLAN She has now considered postmenopausal as multiple labs in the past couple years have shown labs consistent with menopause. Patient completed adjuvant ribociclib as part of [name] trial for 3 years.  She is currently on adjuvant letrozole.  Plan is to continue this for about 7 years. She is due for mammo in May 2024, this has been ordered.  Last bone density in October 2022 with very mild osteopenia, repeat has been ordered.  Since her last visit here she is now being followed by neurology and ophthalmology for unilateral papilledema, awaiting some scans of the MRI brain and MRI orbit.   She will keep [name] posted about these  MRI results. Physical exam is benign, no concern for recurrence.  Eye exam not done. She will return to clinic in 6 months or sooner as needed.  Our research navigator will keep [name] posted of the MRI results and if we need to see her any sooner.  She expressed understanding.  Benay Pike, MD Medical Oncology and Hematology [facility] [address] [place], [place] [address] Tel. [phone]    Fax. [phone]  Total time spent: 30 minutes  *Total Encounter Time as defined by the Centers for Medicare and Medicaid Services includes, in addition to the face-to-face time of a patient visit (documented in the note above) non-face-to-face time: obtaining and reviewing outside history, ordering and reviewing medications, tests or procedures, care coordination (communications with other health care professionals or caregivers) and documentation in the medical record.

## 2022-07-22 ENCOUNTER — Telehealth: Payer: Self-pay

## 2022-07-22 ENCOUNTER — Ambulatory Visit: Payer: 59

## 2022-07-22 ENCOUNTER — Other Ambulatory Visit (HOSPITAL_COMMUNITY): Payer: Self-pay

## 2022-07-22 DIAGNOSIS — Z9889 Other specified postprocedural states: Secondary | ICD-10-CM | POA: Diagnosis not present

## 2022-07-22 DIAGNOSIS — M25612 Stiffness of left shoulder, not elsewhere classified: Secondary | ICD-10-CM

## 2022-07-22 DIAGNOSIS — R293 Abnormal posture: Secondary | ICD-10-CM | POA: Diagnosis not present

## 2022-07-22 NOTE — Patient Instructions (Addendum)
PELVIC TILT: Posterior    Tighten abdominals, flatten low back. __10_ reps per set, hold for __5__ sec, do __1-2_ sets per day. For following hold tilt and add: Open/close like a clam shell  Alt marching keeping knees bent Alternate kick out (working towards heel slide as you get stronger) All 2 x 5, working towards 10 reps before relaxing tilt.    Upper / Lower Extremity Extension (All-Fours)    Tighten stomach by doing a pelvic tilt as described above and raise right leg and opposite arm. Keep trunk rigid. Do not twist at hips or arch low back and keep knee facing down at floor.  Repeat _5___ times per set. Do __2__ sets per session. Do __1-2__ sessions per day.    Cancer Rehab [phone]

## 2022-07-22 NOTE — Therapy (Signed)
OUTPATIENT PHYSICAL THERAPY ONCOLOGY TREATMENT  Patient Name: Mackenzie [name] MRN: [id number] DOB:January 13, 1971, 52 y.o., female Today's Date: 07/22/2022  END OF SESSION:  PT End of Session - 07/22/22 0912     Visit Number 13    Number of Visits 16    Date for PT Re-Evaluation 08/01/22    PT Start Time 0905    PT Stop Time [id number]    PT Time Calculation (min) 57 min    Activity Tolerance Patient tolerated treatment well    Behavior During Therapy [facility] for tasks assessed/performed              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   Allergic rhinitis 2012   Anemia 01/27/2017   prior to starting chemotherapy   Breast cancer ([place]) 01/14/2017   Left breast   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, left ([place]) 01/23/2017   Edema, lower extremity    Healthcare maintenance 03/25/2017   Hot flashes 03/2017   Hyperlipidemia 03/20/2016   Morbid obesity with body mass index (BMI) of 40.0 to 49.9 ([place]) 09/17/2017   Pre-diabetes 07/26/2018   Hgb A1C elevated on 07/26/2018, Gestational Diabetes 2012   Seasonal allergies 2012   seasonal allergies causes allergic rhinitis and itchy, dry eyes per pt   Vitamin D deficiency 05/2015   Past Surgical History:  Procedure Laterality Date   BREAST RECONSTRUCTION WITH PLACEMENT OF TISSUE EXPANDER AND FLEX HD (ACELLULAR HYDRATED DERMIS) Left 08/27/2017   Procedure: LEFT BREAST RECONSTRUCTION WITH PLACEMENT OF TISSUE EXPANDER AND FLEX HD;  Surgeon: Wallace Going, DO;  Location: [place];  Service: Plastics;  Laterality: Left;   CESAREAN SECTION     x2   LATISSIMUS FLAP TO BREAST Left 11/03/2018   LATISSIMUS FLAP TO BREAST Left 11/03/2018   Procedure: LATISSIMUS FLAP TO LEFT BREAST;  Surgeon: Wallace Going, DO;  Location: [place];  Service: Plastics;  Laterality: Left;   MASTECTOMY MODIFIED RADICAL Left 08/27/2017   MASTECTOMY MODIFIED RADICAL Left  08/27/2017   Procedure: LEFT MODIFIED RADICAL MASTECTOMY;  Surgeon: Erroll Luna, MD;  Location: [place];  Service: General;  Laterality: Left;   MASTOPEXY Right 03/03/2019   Procedure: RIGHT BREAST MASTOPEXY/REDUCTION;  Surgeon: Wallace Going, DO;  Location: [place];  Service: Plastics;  Laterality: Right;  3 hours, please   PORT-A-CATH REMOVAL Right 08/27/2017   Procedure: REMOVAL PORT-A-CATH RIGHT CHEST;  Surgeon: Erroll Luna, MD;  Location: [place];  Service: General;  Laterality: Right;   PORTACATH PLACEMENT Right 01/28/2017   Procedure: INSERTION PORT-A-CATH WITH ULTRASOUND;  Surgeon: Erroll Luna, MD;  Location: [place];  Service: General;  Laterality: Right;   REDUCTION MAMMAPLASTY Right    REMOVAL OF TISSUE EXPANDER AND PLACEMENT OF IMPLANT Left 03/03/2019   Procedure: REMOVAL OF TISSUE EXPANDER AND PLACEMENT OF EXPANDER;  Surgeon: Wallace Going, DO;  Location: [place];  Service: Plastics;  Laterality: Left;   REMOVAL OF TISSUE EXPANDER AND PLACEMENT OF IMPLANT Left 05/25/2019   Procedure: LEFT BREAST REMOVAL OF TISSUE EXPANDER AND PLACEMENT OF IMPLANT;  Surgeon: Wallace Going, DO;  Location: [place];  Service: Plastics;  Laterality: Left;   TISSUE EXPANDER PLACEMENT Left 11/03/2018   Procedure: PLACEMENT OF TISSUE EXPANDER LEFT BREAST;  Surgeon: Wallace Going, DO;  Location: [place];  Service: Plastics;  Laterality: Left;  Total case time is 3.5  hours   TUBAL LIGATION Bilateral 01/21/2011   Patient Active Problem List   Diagnosis Date Noted   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   Boil of buttock, Left 12/24/2021   Hyperthyroidism 07/22/2021   Low serum thyroid stimulating hormone (TSH) 06/12/2021   Other fatigue 05/23/2021   Hemoglobin low 05/23/2021   Polyphagia 05/16/2020   Vitamin D deficiency 05/16/2020   At risk for osteoporosis 05/16/2020   Prediabetes 05/01/2020    Elevated blood pressure reading 05/01/2020   At risk for impaired metabolic function [id number]   Breast asymmetry following reconstructive surgery 09/20/2019   Essential hypertension 08/17/2018   Class 3 severe obesity with serious comorbidity and body mass index (BMI) of 40.0 to 44.9 in adult [facility]) 09/17/2017   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   Malignant neoplasm of upper-inner quadrant of left breast in female, estrogen receptor positive ([place]) 07/27/2017   Healthcare maintenance 03/25/2017   Malignant 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, estrogen receptor positive ([place]) 01/23/2017   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, left ([place]) 01/23/2017    PCP: Horald Pollen, MD  REFERRING PROVIDER: Donnamarie Rossetti PA-C  REFERRING DIAG: Post Mastectomy, Left upper back and UE pain  THERAPY DIAG:  Stiffness of left shoulder, not elsewhere classified  Status post left breast reconstruction  Abnormal posture  ONSET DATE: 11/03/2018 with recent exacerbation  Rationale for Evaluation and Treatment: Rehabilitation  SUBJECTIVE:                                                                                                                                                                                           SUBJECTIVE STATEMENT: My back has been feeling really good lately. In fact it hasn't hurt for the past few days. I'll be ready to D/C next week so we can get a few more sessions of strengthening.   PERTINENT HISTORY:  Left breast cancer diagnosed 01/10/17 by mammogram and needle biopsy. Then had neo-adjuvant chemo starting 02/10/17 until 06/26/17; had left mastectomy 08/27/17 with ALND and immediate expander placed. Radiation completed. Expander removed and replaced on May 27, 19 b/c it did not get expanded enough prior to radiation, also had lat flap 11/03/18. Had expander removed and replaced again on 03/03/19 due to a small leak, pt had reduction  on R breast at this time   PAIN:  Are you having pain? No ,  PRECAUTIONS: Other: left UE lymphedema risk  WEIGHT BEARING RESTRICTIONS: No  FALLS:  Has patient fallen in last 6 months? No  LIVING ENVIRONMENT: Lives with: her 54 yr old and 74 yr old daughters Lives  in: House/apartment Stairs: No; External: 2 steps; none Has following equipment at home: Crutches and bed side commode  OCCUPATION: [employer], [employer]  LEISURE: walk, watch movies, spend time with kids.  HAND DOMINANCE: right   PRIOR LEVEL OF FUNCTION: Independent  PATIENT GOALS: Get rid of sharp pain.   OBJECTIVE:  COGNITION: Overall cognitive status: Within functional limits for tasks assessed   PALPATION: Tender left scapular region, lateral trunk, pectorals, very tight UT  OBSERVATIONS / OTHER ASSESSMENTS: no visible swelling however pt with decreased muscle mass noted  left scapular region. Healed incision from lat flap with mild tenderness mid incision. Mild compensation and increased effort noted with left shoulder AROM  SENSATION: Light touch: Appears intact POSTURE: significant forward head, rounded shoulders  UPPER EXTREMITY AROM/PROM:  A/PROM RIGHT   eval   Shoulder extension 55  Shoulder flexion 140  Shoulder abduction 157  Shoulder internal rotation 70  Shoulder external rotation 95    (Blank rows = not tested)  A/PROM LEFT   eval LEFT 06/19/22 LEFT 07/03/2022  Shoulder extension 45    Shoulder flexion 132 158 152  Shoulder abduction 135 156 155  Shoulder internal rotation 54    Shoulder external rotation 75      (Blank rows = not tested)  CERVICAL AROM: All within functional  limits:   UPPER EXTREMITY STRENGTH:  Left grip 40,52,45; avg 45.6 Right grip 50,47,55 avg 50.66  LYMPHEDEMA ASSESSMENTS:  SURGERY TYPE/DATE: Left Modified Radical Mastectomy 08/27/2017, 11/03/2018 Left Lat Flap with tissue expander, 05/25/2019 Removal of expander and placement of  implant NUMBER OF LYMPH NODES REMOVED: 1/4 CHEMOTHERAPY: yes neoadjuvant RADIATION:yes HORMONE TREATMENT: yes, Letrozole INFECTIONS: NO  LYMPHEDEMA ASSESSMENTS:   LANDMARK RIGHT  eval RIGHT  06/10/2022  10 cm proximal to olecranon process 39.5 39.5  Olecranon process 30 30  10 $ cm proximal to ulnar styloid process 24 25.9  Just proximal to ulnar styloid process 17.5 17.7  Across hand at thumb web space 20 20  At base of 2nd digit 6.4 6.3  (Blank rows = not tested)  LANDMARK LEFT  eval LEFT 06/10/2022  10 cm proximal to olecranon process 39.5 40  Olecranon process 29.7 31  10 $ cm proximal to ulnar styloid process 25.5 26.4  Just proximal to ulnar styloid process 18.7 19  Across hand at thumb web space 19.6 20.7  At base of 2nd digit 6.4 6.1  (Blank rows = not tested)    QUICK DASH SURVEY: 31.82   The patient was assessed using the L-Dex machine today to produce a lymphedema index baseline score. The patient will be reassessed on a regular basis (typically every 3 months) to obtain new L-Dex scores. If the score is > 6.5 points away from his/her baseline score indicating onset of subclinical lymphedema, it will be recommended to wear a compression garment for 4 weeks, 12 hours per day and then be reassessed. If the score continues to be > 6.5 points from baseline at reassessment, we will initiate lymphedema treatment. Assessing in this manner has a 95% rate of preventing clinically significant lymphedema.  TODAY'S TREATMENT:  DATE: 07/22/22: Therapeutic Exercises Supine over full foam roll for supine scapular series with red theraband x10 each with min VC's for technique and to keep core engaged throughout Supine on large mat table for core stabs: Pelvic Tilt x10, 5 sec holds returning therapist demo, then hold tilt for following: Open/close knees  like a clam 2 x 5, then alt march 2 x 5 and then SAQ (unable to heel slide and hold tilt but pt educated how to progress with this)  Quadruped for alt UE flex/LE ext x 5 each returning therapist demo and VC's to keep core engaged throughout  Yellow ball roll up wall into flexion and Bil abd x 10 each; also wall slide into flexion with Lt UE and end with Rt trunk twist for increased Lt axillary stretch Manual Therapy PROM left shoulder flex, abd and D2 with scapular depression by therapist throughout STM with cocoa butter during P/ROM at Lt pectoralis insertion and lats where pt still palpably tight but much improved since this therapist saw her last  07/17/2022 STM to left pectorals, lats, UT, and left intercostals and in SL to scapular region and lats in Rt S/L with cupping in addition with cocoa butter PROM left shoulder flex, abd and D2, ER with scapular depression Supine wand x 5 flexion and scaption Supine alphabet x 1, SL alphabet 1# x 1 Rhythmic stabs at 90 deg. Flexion 1 x 20 sec, abd 1 x 20 Supine horizontal abduction yellow x 10 Supine sword yellow 2 x 5 Standing scapular retraction and extension yellow x 10  07/14/2022 Ball rolls on wall x 10 flexion, 5 for abduction bil Shoulder rolls posterior x 10 Standing shoulder flexon and scaption in front of mirror x10 ea 4 D ball rolls x 10 ea Yellow band ER x 10 standing Yellow band horizontal abd x 10 bil Supine alphabet x 1 Rhythmic stabs at 90 deg. Flexion 3 x 20 sec Serratus punches x 10 STM to left pectorals, lats, UT, and left intercostals and in SL to scapular region and lats in Rt S/L with cocoa butter PROM left shoulder flex, abd and D2, ER with scapular depression   PATIENT EDUCATION:    HOME EXERCISE PROGRAM: Access Code: [email or web address] URL: [email or web address] Date: 07/17/2022 Prepared by: Cheral Almas  Exercises - Scapular Retraction with Resistance  - 1 x daily - 3 x weekly - 1 sets - 10 reps -  Shoulder Extension with Resistance  - 1 x daily - 3 x weekly - 1 sets - 10 reps - Shoulder External Rotation and Scapular Retraction with Resistance  - 1 x daily - 3 x weekly - 1 sets - 10 reps  Supine Shoulder Flexion AAROM with Hands Clasped  - 2 x daily - 7 x weekly - 1 sets - 5 reps - 5 hold - Supine Chest Stretch with Arms Behind Head  - 2 x daily - 7 x weekly - 1 sets - 5 reps - 5 hold - Standing Shoulder Abduction Wall Slide with Thumb Out  - 2 x daily - 7 x weekly - 1 sets - 5 reps - 5 hold Person educated: Patient Access Code: [email or web address] URL: [email or web address] Date: 06/12/2022 Prepared by: Collie Siad  Exercises Access Code: [email or web address] URL: [email or web address] Date: 06/19/2022 Prepared by: Manus Gunning  Exercises - Supine Shoulder Flexion AAROM with Hands Clasped  - 2 x daily - 7 x weekly - 1 sets - 5 reps - 5 hold - Supine Chest Stretch with Arms Behind Head  -  2 x daily - 7 x weekly - 1 sets - 5 reps - 5 hold - Standing Shoulder Abduction Wall Slide with Thumb Out  - 2 x daily - 7 x weekly - 1 sets - 5 reps - 5 hold - Latissimus Dorsi Stretch at Wall  - 2-3 x daily - 7 x weekly - 5 reps - 5-10 hold - Supine Lower Trunk Rotation  - 2 x daily - 7 x weekly - 1 sets - 10 reps - 10-20 hold - Shoulder extension with resistance - Neutral  - 1 x daily - 5 x weekly - 1-2 sets - 10 reps - 3 hold - Downward Dog  - 1 x daily - 7 x weekly - 5 reps - 5 hold - Tennis Ball Self Massage on Wall  - 1 x daily - 7 x weekly - 1 sets - 10 reps - Supine Shoulder External Rotation with Resistance  - 1 x daily - 4-5 x weekly - 1 sets - 10 reps - Supine Shoulder Horizontal Abduction with Resistance  - 1 x daily - 4-5 x weekly - 1 sets - 10 reps - Supine narrow and wide grip flexion  - 1 x daily - 4-5 x weekly - 1-2 sets - 10 reps - 3 sec hold - Supine PNF D2   - 1 x daily - 4-5 x weekly - 1-2 sets - 10 reps - 3 sec hold  07/22/22 - Added following to her  Medbridge - Supine Posterior Pelvic Tilt  - 1-2 x daily - 7 x weekly - 1 sets - 10 reps - 5 hold (with notes to include clam and alt SAQ) - Supine March with Posterior Pelvic Tilt  - 1 x daily - 7 x weekly - 2 sets - 5 reps (working towards heel slide once stronger) - Bird Dog  - 1 x daily - 7 x weekly - 2 sets - 5 reps - 3 hold  ASSESSMENT:  CLINICAL IMPRESSION:  Pt reports feeling great past few days with no back pain. She wants to focus on strengthening in preparation for D/C next week so did this today. She tolerated this well and reported feeling challenged by activities. Pt has a large ball at home so instructed her she could duplicate these exs on her ball. Issued handout and added new exs to her [place] as well.   OBJECTIVE IMPAIRMENTS: decreased ROM, decreased strength, impaired UE functional use, postural dysfunction, and pain.   ACTIVITY LIMITATIONS: sleeping, reach over head, and hygiene/grooming  PARTICIPATION LIMITATIONS:  She participates in all with occasional sharp pains  PERSONAL FACTORS: 1-2 comorbidities: Left mastectomy with reconstruction and expanders  are also affecting patient's functional outcome.   REHAB POTENTIAL: Good  CLINICAL DECISION MAKING: Stable/uncomplicated  EVALUATION COMPLEXITY: Low  GOALS: Goals reviewed with patient? Yes LONG TERM GOALS: Target date: 08/01/2022  Pt will have left shoulder abduction 150 for improved reaching and decreased tightness Baseline: 135 Goal status: MET 07/03/2022  2.  Pt will have improved ER on left to 88 degrees for decreased pectoral tightness Baseline: 75 Goal status: INITIAL  3.  Pt will improve quick dash to no greater than 15% Baseline: 31.82 Goal status: INITIAL  4.  Pt will have decreased tenderness/sharp pain by atleast 50% Baseline:  Goal status: INITIAL  PLAN:  PT FREQUENCY: 1-2x/week  PT DURATION: 8 weeks  PLANNED INTERVENTIONS: Therapeutic exercises, Neuromuscular re-education,  Patient/Family education, Self Care, Joint mobilization, Orthotic/Fit training, Dry Needling, and Manual therapy  PLAN FOR NEXT SESSION: D/C next week per POC; scapular stabs,, STM left upper quarter/cupping, DN if warranted, PROM, progress to strength as tolerated, measure circumference of arms ,  Monitor left arm for lymphedema,      Collie Siad, PTA 07/22/22 10:26 AM   PELVIC TILT: Posterior    Tighten abdominals, flatten low back. __10_ reps per set, hold for __5__ sec, do __1-2_ sets per day. For following hold tilt and add: Open/close like a clam shell  Alt marching keeping knees bent Alternate kick out (working towards heel slide as you get stronger) All 2 x 5, working towards 10 reps before relaxing tilt.    Upper / Lower Extremity Extension (All-Fours)    Tighten stomach by doing a pelvic tilt as described above and raise right leg and opposite arm. Keep trunk rigid. Do not twist at hips or arch low back and keep knee facing down at floor.  Repeat _5___ times per set. Do __2__ sets per session. Do __1-2__ sessions per day.    Cancer Rehab [phone]

## 2022-07-22 NOTE — Telephone Encounter (Signed)
Called pt to tell her the results of her MRI. Pt mailbox was full, couldn't leave a message.

## 2022-07-23 ENCOUNTER — Telehealth: Payer: Self-pay | Admitting: Hematology and Oncology

## 2022-07-23 NOTE — Progress Notes (Signed)
.    Chief Complaint:   OBESITY Mackenzie [name] is here to discuss her progress with her obesity treatment plan along with follow-up of her obesity related diagnoses. Mackenzie [name] is on the Category 3 Plan and journaling and states she is following her eating plan approximately 75% of the time. Mackenzie [name] states she is walking 30-45 minutes 3 times per week.  Today's visit was #: 45 Starting weight: 52 LBS Starting date: 08/09/2019 Today's weight: 238 LBS Today's date: 07/10/2022 Total lbs lost to date: 29 LBS Total lbs lost since last in-office visit: 5 LBS  Interim History:  Mackenzie [name] reports diarrhea for the last 7 days. She will have 1-2 loose stools/day. She denies fever, hematochezia, abdominal pain This morning her BM was more firm.  Of Note- she in unable to continue GLP-1 therapy- cost prohibitive  Subjective:   1. Diarrhea/Colitis unspecified type Ms. Djuric reports diarrhea for the last 7 days. She will have 1-2 loose stools/day. She denies fever, hematochezia, abdominal pain This morning her BM was more firm.  2. Pre-diabetes Lab Results  Component Value Date   HGBA1C 5.4 01/29/2022   HGBA1C 5.4 12/19/2021   HGBA1C 5.4 05/23/2021    Patient is unable to continue GLP-1 therapy due to cost. Wegovy $1000, Saxenda $700.   Patient has remained on metformin 500 mg twice daily with stable hunger levels.  Assessment/Plan:   1. Diarrhea, unspecified type If patient's symptoms are still present on Monday, 07/14/2022, to be seen by PCP.  Start- ondansetron (ZOFRAN) 4 MG tablet; Take 1 tablet (4 mg total) by mouth every 8 (eight) hours as needed for nausea or vomiting.  Dispense: 20 tablet; Refill: 0  2. Pre-diabetes Continue Metformin as directed.  3. Obesity, current BMI 39.6 Mackenzie [name] is currently in the action stage of change. As such, her goal is to continue with weight loss efforts. She has agreed to the Category 3 Plan.   Exercise goals: All adults should avoid  inactivity. Some physical activity is better than none, and adults who participate in any amount of physical activity gain some health benefits.  Behavioral modification strategies: increasing lean protein intake, decreasing simple carbohydrates, meal planning and cooking strategies, keeping healthy foods in the home, and planning for success.  Mackenzie [name] has agreed to follow-up with our clinic in 4 weeks. She was informed of the importance of frequent follow-up visits to maximize her success with intensive lifestyle modifications for her multiple health conditions.   Objective:   Blood pressure 111/77, pulse 73, temperature 98.1 F (36.7 C), height 5' 5"$  (1.651 m), weight 238 lb (108 kg), last menstrual period 11/28/2016, SpO2 98 %, unknown if currently breastfeeding. Body mass index is 39.61 kg/m.  General: Cooperative, alert, well developed, in no acute distress. HEENT: Conjunctivae and lids unremarkable. Cardiovascular: Regular rhythm.  Lungs: Normal work of breathing. Neurologic: No focal deficits.   Lab Results  Component Value Date   CREATININE 0.88 01/29/2022   BUN 16 01/29/2022   NA 137 01/29/2022   K 4.5 01/29/2022   CL 101 01/29/2022   CO2 23 01/29/2022   Lab Results  Component Value Date   ALT 15 01/29/2022   AST 16 01/29/2022   GGT 19 03/01/2021   ALKPHOS 177 (H) 01/29/2022   BILITOT 0.3 01/29/2022   Lab Results  Component Value Date   HGBA1C 5.4 01/29/2022   HGBA1C 5.4 12/19/2021   HGBA1C 5.4 05/23/2021   HGBA1C 5.4 02/27/2021   HGBA1C 5.4 11/15/2020   Lab Results  Component Value Date   INSULIN 21.3 01/29/2022   INSULIN 18.2 01/07/2022   INSULIN 15.1 05/23/2021   INSULIN 15.1 02/27/2021   INSULIN 11.7 11/15/2020   Lab Results  Component Value Date   TSH 4.94 07/14/2022   Lab Results  Component Value Date   CHOL 202 (H) 11/15/2020   HDL 58 11/15/2020   LDLCALC 135 (H) 11/15/2020   TRIG 52 11/15/2020   CHOLHDL 3.5 11/15/2020   Lab Results   Component Value Date   VD25OH 50.2 01/29/2022   VD25OH 49.2 12/19/2021   VD25OH 102.0 (H) 05/23/2021   Lab Results  Component Value Date   WBC 3.8 (L) 07/23/2021   HGB 11.5 (L) 07/23/2021   HCT 35.9 (L) 07/23/2021   MCV 79.6 07/23/2021   PLT 201.0 07/23/2021   No results found for: "IRON", "TIBC", "FERRITIN"  Attestation Statements:   Reviewed by clinician on day of visit: allergies, medications, problem list, medical history, surgical history, family history, social history, and previous encounter notes.  I, Davy Pique, RMA, am acting as Location manager for [name], NP.  I have reviewed the above documentation for accuracy and completeness, and I agree with the above. -  Fahim Kats d. Kingslee Mairena, NP-C

## 2022-07-23 NOTE — Telephone Encounter (Signed)
Per 2/12 IB Reached out to patient to schedule appointment, unable to leave voicemail will call back patient back at a later time.

## 2022-07-24 ENCOUNTER — Ambulatory Visit: Payer: 59

## 2022-07-24 DIAGNOSIS — R293 Abnormal posture: Secondary | ICD-10-CM | POA: Diagnosis not present

## 2022-07-24 DIAGNOSIS — Z9889 Other specified postprocedural states: Secondary | ICD-10-CM

## 2022-07-24 DIAGNOSIS — M25612 Stiffness of left shoulder, not elsewhere classified: Secondary | ICD-10-CM

## 2022-07-24 NOTE — Therapy (Signed)
OUTPATIENT PHYSICAL THERAPY ONCOLOGY TREATMENT  Patient Name: Mackenzie [name] MRN: [id number] DOB:17-Mar-1971, 52 y.o., female Today's Date: 07/24/2022  END OF SESSION:  PT End of Session - 07/24/22 0903     Visit Number 14    Number of Visits 16    Date for PT Re-Evaluation 08/01/22    PT Start Time 0905    PT Stop Time 1000    PT Time Calculation (min) 55 min    Activity Tolerance Patient tolerated treatment well    Behavior During Therapy [facility] for tasks assessed/performed              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   Allergic rhinitis 2012   Anemia 01/27/2017   prior to starting chemotherapy   Breast cancer ([place]) 01/14/2017   Left breast   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, left ([place]) 01/23/2017   Edema, lower extremity    Healthcare maintenance 03/25/2017   Hot flashes 03/2017   Hyperlipidemia 03/20/2016   Morbid obesity with body mass index (BMI) of 40.0 to 49.9 ([place]) 09/17/2017   Pre-diabetes 07/26/2018   Hgb A1C elevated on 07/26/2018, Gestational Diabetes 2012   Seasonal allergies 2012   seasonal allergies causes allergic rhinitis and itchy, dry eyes per pt   Vitamin D deficiency 05/2015   Past Surgical History:  Procedure Laterality Date   BREAST RECONSTRUCTION WITH PLACEMENT OF TISSUE EXPANDER AND FLEX HD (ACELLULAR HYDRATED DERMIS) Left 08/27/2017   Procedure: LEFT BREAST RECONSTRUCTION WITH PLACEMENT OF TISSUE EXPANDER AND FLEX HD;  Surgeon: Wallace Going, DO;  Location: [place];  Service: Plastics;  Laterality: Left;   CESAREAN SECTION     x2   LATISSIMUS FLAP TO BREAST Left 11/03/2018   LATISSIMUS FLAP TO BREAST Left 11/03/2018   Procedure: LATISSIMUS FLAP TO LEFT BREAST;  Surgeon: Wallace Going, DO;  Location: [place];  Service: Plastics;  Laterality: Left;   MASTECTOMY MODIFIED RADICAL Left 08/27/2017   MASTECTOMY MODIFIED RADICAL Left  08/27/2017   Procedure: LEFT MODIFIED RADICAL MASTECTOMY;  Surgeon: Erroll Luna, MD;  Location: [place];  Service: General;  Laterality: Left;   MASTOPEXY Right 03/03/2019   Procedure: RIGHT BREAST MASTOPEXY/REDUCTION;  Surgeon: Wallace Going, DO;  Location: [place];  Service: Plastics;  Laterality: Right;  3 hours, please   PORT-A-CATH REMOVAL Right 08/27/2017   Procedure: REMOVAL PORT-A-CATH RIGHT CHEST;  Surgeon: Erroll Luna, MD;  Location: [place];  Service: General;  Laterality: Right;   PORTACATH PLACEMENT Right 01/28/2017   Procedure: INSERTION PORT-A-CATH WITH ULTRASOUND;  Surgeon: Erroll Luna, MD;  Location: [place];  Service: General;  Laterality: Right;   REDUCTION MAMMAPLASTY Right    REMOVAL OF TISSUE EXPANDER AND PLACEMENT OF IMPLANT Left 03/03/2019   Procedure: REMOVAL OF TISSUE EXPANDER AND PLACEMENT OF EXPANDER;  Surgeon: Wallace Going, DO;  Location: [place];  Service: Plastics;  Laterality: Left;   REMOVAL OF TISSUE EXPANDER AND PLACEMENT OF IMPLANT Left 05/25/2019   Procedure: LEFT BREAST REMOVAL OF TISSUE EXPANDER AND PLACEMENT OF IMPLANT;  Surgeon: Wallace Going, DO;  Location: [place];  Service: Plastics;  Laterality: Left;   TISSUE EXPANDER PLACEMENT Left 11/03/2018   Procedure: PLACEMENT OF TISSUE EXPANDER LEFT BREAST;  Surgeon: Wallace Going, DO;  Location: [place];  Service: Plastics;  Laterality: Left;  Total case time is 3.5  hours   TUBAL LIGATION Bilateral 01/21/2011   Patient Active Problem List   Diagnosis Date Noted   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   Boil of buttock, Left 12/24/2021   Hyperthyroidism 07/22/2021   Low serum thyroid stimulating hormone (TSH) 06/12/2021   Other fatigue 05/23/2021   Hemoglobin low 05/23/2021   Polyphagia 05/16/2020   Vitamin D deficiency 05/16/2020   At risk for osteoporosis 05/16/2020   Prediabetes 05/01/2020    Elevated blood pressure reading 05/01/2020   At risk for impaired metabolic function [id number]   Breast asymmetry following reconstructive surgery 09/20/2019   Essential hypertension 08/17/2018   Class 3 severe obesity with serious comorbidity and body mass index (BMI) of 40.0 to 44.9 in adult [facility]) 09/17/2017   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   Malignant neoplasm of upper-inner quadrant of left breast in female, estrogen receptor positive ([place]) 07/27/2017   Healthcare maintenance 03/25/2017   Malignant 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, estrogen receptor positive ([place]) 01/23/2017   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, left ([place]) 01/23/2017    PCP: Horald Pollen, MD  REFERRING PROVIDER: Donnamarie Rossetti PA-C  REFERRING DIAG: Post Mastectomy, Left upper back and UE pain  THERAPY DIAG:  Stiffness of left shoulder, not elsewhere classified  Status post left breast reconstruction  Abnormal posture  ONSET DATE: 11/03/2018 with recent exacerbation  Rationale for Evaluation and Treatment: Rehabilitation  SUBJECTIVE:                                                                                                                                                                                           SUBJECTIVE STATEMENT:  Pain has been really good. I haven't had any sharpness in my back for almost 2 weeks. I haven't had any more rib pain with putting on my shoes either  PERTINENT HISTORY:  Left breast cancer diagnosed 01/10/17 by mammogram and needle biopsy. Then had neo-adjuvant chemo starting 02/10/17 until 06/26/17; had left mastectomy 08/27/17 with ALND and immediate expander placed. Radiation completed. Expander removed and replaced on May 27, 19 b/c it did not get expanded enough prior to radiation, also had lat flap 11/03/18. Had expander removed and replaced again on 03/03/19 due to a small leak, pt had reduction on R breast at this  time   PAIN:  Are you having pain? No ,  PRECAUTIONS: Other: left UE lymphedema risk  WEIGHT BEARING RESTRICTIONS: No  FALLS:  Has patient fallen in last 6 months? No  LIVING ENVIRONMENT: Lives with: her 54 yr old and 64 yr old daughters Lives in: House/apartment Stairs: No; External:  2 steps; none Has following equipment at home: Crutches and bed side commode  OCCUPATION: [employer], [employer]  LEISURE: walk, watch movies, spend time with kids.  HAND DOMINANCE: right   PRIOR LEVEL OF FUNCTION: Independent  PATIENT GOALS: Get rid of sharp pain.   OBJECTIVE:  COGNITION: Overall cognitive status: Within functional limits for tasks assessed   PALPATION: Tender left scapular region, lateral trunk, pectorals, very tight UT  OBSERVATIONS / OTHER ASSESSMENTS: no visible swelling however pt with decreased muscle mass noted  left scapular region. Healed incision from lat flap with mild tenderness mid incision. Mild compensation and increased effort noted with left shoulder AROM  SENSATION: Light touch: Appears intact POSTURE: significant forward head, rounded shoulders  UPPER EXTREMITY AROM/PROM:  A/PROM RIGHT   eval   Shoulder extension 55  Shoulder flexion 140  Shoulder abduction 157  Shoulder internal rotation 70  Shoulder external rotation 95    (Blank rows = not tested)  A/PROM LEFT   eval LEFT 06/19/22 LEFT 07/03/2022 LEFT 07/24/2022  Shoulder extension 45     Shoulder flexion 132 158 152   Shoulder abduction 135 156 155 156  Shoulder internal rotation 54     Shoulder external rotation 75   90    (Blank rows = not tested)  CERVICAL AROM: All within functional  limits:   UPPER EXTREMITY STRENGTH:  Left grip 40,52,45; avg 45.6 Right grip 50,47,55 avg 50.66  LYMPHEDEMA ASSESSMENTS:  SURGERY TYPE/DATE: Left Modified Radical Mastectomy 08/27/2017, 11/03/2018 Left Lat Flap with tissue expander, 05/25/2019 Removal of expander and placement of  implant NUMBER OF LYMPH NODES REMOVED: 1/4 CHEMOTHERAPY: yes neoadjuvant RADIATION:yes HORMONE TREATMENT: yes, Letrozole INFECTIONS: NO  LYMPHEDEMA ASSESSMENTS:   LANDMARK RIGHT  eval RIGHT  06/10/2022  10 cm proximal to olecranon process 39.5 39.5  Olecranon process 30 30  10 $ cm proximal to ulnar styloid process 24 25.9  Just proximal to ulnar styloid process 17.5 17.7  Across hand at thumb web space 20 20  At base of 2nd digit 6.4 6.3  (Blank rows = not tested)  LANDMARK LEFT  eval LEFT 06/10/2022  10 cm proximal to olecranon process 39.5 40  Olecranon process 29.7 31  10 $ cm proximal to ulnar styloid process 25.5 26.4  Just proximal to ulnar styloid process 18.7 19  Across hand at thumb web space 19.6 20.7  At base of 2nd digit 6.4 6.1  (Blank rows = not tested)    QUICK DASH SURVEY: 31.82   The patient was assessed using the L-Dex machine today to produce a lymphedema index baseline score. The patient will be reassessed on a regular basis (typically every 3 months) to obtain new L-Dex scores. If the score is > 6.5 points away from his/her baseline score indicating onset of subclinical lymphedema, it will be recommended to wear a compression garment for 4 weeks, 12 hours per day and then be reassessed. If the score continues to be > 6.5 points from baseline at reassessment, we will initiate lymphedema treatment. Assessing in this manner has a 95% rate of preventing clinically significant lymphedema.  TODAY'S TREATMENT:  DATE: 07/24/2022  Supine over full foam roll with red band for supine scapular series x 10, occasional VC's Standing Jobes flexion and scaption core engaged, scap depressed 1# x 10 ea Quadruped stabilization alternating arm and leg x 5, Vcs to keep abs engaged. STM to left pectorals, lats, UT, and left intercostals and in SL  to scapular region and lats with cupping in Rt S/L with cocoa butter PROM left shoulder flex, abd and D2, ER with scapular depression  07/22/22: Therapeutic Exercises Supine over full foam roll for supine scapular series with red theraband x10 each with min VC's for technique and to keep core engaged throughout Supine on large mat table for core stabs: Pelvic Tilt x10, 5 sec holds returning therapist demo, then hold tilt for following: Open/close knees like a clam 2 x 5, then alt march 2 x 5 and then SAQ (unable to heel slide and hold tilt but pt educated how to progress with this)  Quadruped for alt UE flex/LE ext x 5 each returning therapist demo and VC's to keep core engaged throughout  Yellow ball roll up wall into flexion and Bil abd x 10 each; also wall slide into flexion with Lt UE and end with Rt trunk twist for increased Lt axillary stretch Manual Therapy PROM left shoulder flex, abd and D2 with scapular depression by therapist throughout STM with cocoa butter during P/ROM at Lt pectoralis insertion and lats where pt still palpably tight but much improved since this therapist saw her last  07/17/2022 STM to left pectorals, lats, UT, and left intercostals and in SL to scapular region and lats in Rt S/L with cupping in addition with cocoa butter PROM left shoulder flex, abd and D2, ER with scapular depression Supine wand x 5 flexion and scaption Supine alphabet x 1, SL alphabet 1# x 1 Rhythmic stabs at 90 deg. Flexion 1 x 20 sec, abd 1 x 20 Supine horizontal abduction yellow x 10 Supine sword yellow 2 x 5 Standing scapular retraction and extension yellow x 10  07/14/2022 Ball rolls on wall x 10 flexion, 5 for abduction bil Shoulder rolls posterior x 10 Standing shoulder flexon and scaption in front of mirror x10 ea 4 D ball rolls x 10 ea Yellow band ER x 10 standing Yellow band horizontal abd x 10 bil Supine alphabet x 1 Rhythmic stabs at 90 deg. Flexion 3 x 20 sec Serratus punches  x 10 STM to left pectorals, lats, UT, and left intercostals and in SL to scapular region and lats in Rt S/L with cocoa butter PROM left shoulder flex, abd and D2, ER with scapular depression   PATIENT EDUCATION:    HOME EXERCISE PROGRAM: Access Code: [email or web address] URL: [email or web address] Date: 07/17/2022 Prepared by: Cheral Almas  Exercises - Scapular Retraction with Resistance  - 1 x daily - 3 x weekly - 1 sets - 10 reps - Shoulder Extension with Resistance  - 1 x daily - 3 x weekly - 1 sets - 10 reps - Shoulder External Rotation and Scapular Retraction with Resistance  - 1 x daily - 3 x weekly - 1 sets - 10 reps  Supine Shoulder Flexion AAROM with Hands Clasped  - 2 x daily - 7 x weekly - 1 sets - 5 reps - 5 hold - Supine Chest Stretch with Arms Behind Head  - 2 x daily - 7 x weekly - 1 sets - 5 reps - 5 hold - Standing Shoulder Abduction Wall Slide with Thumb Out  -  2 x daily - 7 x weekly - 1 sets - 5 reps - 5 hold Person educated: Patient Access Code: [email or web address] URL: [email or web address] Date: 06/12/2022 Prepared by: Collie Siad  Exercises Access Code: [email or web address] URL: [email or web address] Date: 06/19/2022 Prepared by: Manus Gunning  Exercises - Supine Shoulder Flexion AAROM with Hands Clasped  - 2 x daily - 7 x weekly - 1 sets - 5 reps - 5 hold - Supine Chest Stretch with Arms Behind Head  - 2 x daily - 7 x weekly - 1 sets - 5 reps - 5 hold - Standing Shoulder Abduction Wall Slide with Thumb Out  - 2 x daily - 7 x weekly - 1 sets - 5 reps - 5 hold - Latissimus Dorsi Stretch at Wall  - 2-3 x daily - 7 x weekly - 5 reps - 5-10 hold - Supine Lower Trunk Rotation  - 2 x daily - 7 x weekly - 1 sets - 10 reps - 10-20 hold - Shoulder extension with resistance - Neutral  - 1 x daily - 5 x weekly - 1-2 sets - 10 reps - 3 hold - Downward Dog  - 1 x daily - 7 x weekly - 5 reps - 5 hold - Tennis Ball Self Massage on Wall  - 1 x  daily - 7 x weekly - 1 sets - 10 reps - Supine Shoulder External Rotation with Resistance  - 1 x daily - 4-5 x weekly - 1 sets - 10 reps - Supine Shoulder Horizontal Abduction with Resistance  - 1 x daily - 4-5 x weekly - 1 sets - 10 reps - Supine narrow and wide grip flexion  - 1 x daily - 4-5 x weekly - 1-2 sets - 10 reps - 3 sec hold - Supine PNF D2   - 1 x daily - 4-5 x weekly - 1-2 sets - 10 reps - 3 sec hold  07/22/22 - Added following to her Medbridge - Supine Posterior Pelvic Tilt  - 1-2 x daily - 7 x weekly - 1 sets - 10 reps - 5 hold (with notes to include clam and alt SAQ) - Supine March with Posterior Pelvic Tilt  - 1 x daily - 7 x weekly - 2 sets - 5 reps (working towards heel slide once stronger) - Bird Dog  - 1 x daily - 7 x weekly - 2 sets - 5 reps - 3 hold  ASSESSMENT:  CLINICAL IMPRESSION:  Pt did very well with exercises instructed and required very few cues for form and to activate core. She had some difficulty maintaining core with quad stab exercises and difficulty with balance. She has not had any back pain in 2 weeks. She is progressing nicely with goals and is feeling stronger with less compensation.   OBJECTIVE IMPAIRMENTS: decreased ROM, decreased strength, impaired UE functional use, postural dysfunction, and pain.   ACTIVITY LIMITATIONS: sleeping, reach over head, and hygiene/grooming  PARTICIPATION LIMITATIONS:  She participates in all with occasional sharp pains  PERSONAL FACTORS: 1-2 comorbidities: Left mastectomy with reconstruction and expanders  are also affecting patient's functional outcome.   REHAB POTENTIAL: Good  CLINICAL DECISION MAKING: Stable/uncomplicated  EVALUATION COMPLEXITY: Low  GOALS: Goals reviewed with patient? Yes LONG TERM GOALS: Target date: 08/01/2022  Pt will have left shoulder abduction 150 for improved reaching and decreased tightness Baseline: 135 Goal status: MET 07/03/2022  2.  Pt will have improved ER on left to 88  degrees for decreased pectoral tightness Baseline:  75 Goal status: MET 07/24/2022 3.  Pt will improve quick dash to no greater than 15% Baseline: 31.82 Goal status: INITIAL  4.  Pt will have decreased tenderness/sharp pain by atleast 50% Baseline:  Goal status:MET 08/03/2022 PLAN:  PT FREQUENCY: 1-2x/week  PT DURATION: 8 weeks  PLANNED INTERVENTIONS: Therapeutic exercises, Neuromuscular re-education, Patient/Family education, Self Care, Joint mobilization, Orthotic/Fit training, Dry Needling, and Manual therapy  PLAN FOR NEXT SESSION: D/C next week per POC; scapular stabs,, STM left upper quarter/cupping, DN if warranted, PROM, progress to strength as tolerated, measure circumference of arms ,  Monitor left arm for lymphedema,      Collie Siad, PTA 07/24/22 10:03 AM   PELVIC TILT: Posterior    Tighten abdominals, flatten low back. __10_ reps per set, hold for __5__ sec, do __1-2_ sets per day. For following hold tilt and add: Open/close like a clam shell  Alt marching keeping knees bent Alternate kick out (working towards heel slide as you get stronger) All 2 x 5, working towards 10 reps before relaxing tilt.    Upper / Lower Extremity Extension (All-Fours)    Tighten stomach by doing a pelvic tilt as described above and raise right leg and opposite arm. Keep trunk rigid. Do not twist at hips or arch low back and keep knee facing down at floor.  Repeat _5___ times per set. Do __2__ sets per session. Do __1-2__ sessions per day.    Cancer Rehab [phone]

## 2022-07-28 ENCOUNTER — Encounter: Payer: Self-pay | Admitting: *Deleted

## 2022-07-28 DIAGNOSIS — H47332 Pseudopapilledema of optic disc, left eye: Secondary | ICD-10-CM | POA: Diagnosis not present

## 2022-07-29 ENCOUNTER — Ambulatory Visit: Payer: 59

## 2022-07-29 DIAGNOSIS — M25612 Stiffness of left shoulder, not elsewhere classified: Secondary | ICD-10-CM

## 2022-07-29 DIAGNOSIS — Z9889 Other specified postprocedural states: Secondary | ICD-10-CM

## 2022-07-29 DIAGNOSIS — R293 Abnormal posture: Secondary | ICD-10-CM

## 2022-07-29 NOTE — Therapy (Signed)
OUTPATIENT PHYSICAL THERAPY ONCOLOGY TREATMENT  Patient Name: Mackenzie [name] MRN: [id number] DOB:Sep 24, 1970, 52 y.o., female Today's Date: 07/29/2022  END OF SESSION:  PT End of Session - 07/29/22 0917     Visit Number 15    Number of Visits 16    Date for PT Re-Evaluation 08/01/22    PT Start Time 0912   pt arrived late   PT Stop Time 1006    PT Time Calculation (min) 54 min    Activity Tolerance Patient tolerated treatment well    Behavior During Therapy [facility] for tasks assessed/performed              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   Allergic rhinitis 2012   Anemia 01/27/2017   prior to starting chemotherapy   Breast cancer ([place]) 01/14/2017   Left breast   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, left ([place]) 01/23/2017   Edema, lower extremity    Healthcare maintenance 03/25/2017   Hot flashes 03/2017   Hyperlipidemia 03/20/2016   Morbid obesity with body mass index (BMI) of 40.0 to 49.9 ([place]) 09/17/2017   Pre-diabetes 07/26/2018   Hgb A1C elevated on 07/26/2018, Gestational Diabetes 2012   Seasonal allergies 2012   seasonal allergies causes allergic rhinitis and itchy, dry eyes per pt   Vitamin D deficiency 05/2015   Past Surgical History:  Procedure Laterality Date   BREAST RECONSTRUCTION WITH PLACEMENT OF TISSUE EXPANDER AND FLEX HD (ACELLULAR HYDRATED DERMIS) Left 08/27/2017   Procedure: LEFT BREAST RECONSTRUCTION WITH PLACEMENT OF TISSUE EXPANDER AND FLEX HD;  Surgeon: Wallace Going, DO;  Location: [place];  Service: Plastics;  Laterality: Left;   CESAREAN SECTION     x2   LATISSIMUS FLAP TO BREAST Left 11/03/2018   LATISSIMUS FLAP TO BREAST Left 11/03/2018   Procedure: LATISSIMUS FLAP TO LEFT BREAST;  Surgeon: Wallace Going, DO;  Location: [place];  Service: Plastics;  Laterality: Left;   MASTECTOMY MODIFIED RADICAL Left 08/27/2017   MASTECTOMY MODIFIED  RADICAL Left 08/27/2017   Procedure: LEFT MODIFIED RADICAL MASTECTOMY;  Surgeon: Erroll Luna, MD;  Location: [place];  Service: General;  Laterality: Left;   MASTOPEXY Right 03/03/2019   Procedure: RIGHT BREAST MASTOPEXY/REDUCTION;  Surgeon: Wallace Going, DO;  Location: [place];  Service: Plastics;  Laterality: Right;  3 hours, please   PORT-A-CATH REMOVAL Right 08/27/2017   Procedure: REMOVAL PORT-A-CATH RIGHT CHEST;  Surgeon: Erroll Luna, MD;  Location: [place];  Service: General;  Laterality: Right;   PORTACATH PLACEMENT Right 01/28/2017   Procedure: INSERTION PORT-A-CATH WITH ULTRASOUND;  Surgeon: Erroll Luna, MD;  Location: [place];  Service: General;  Laterality: Right;   REDUCTION MAMMAPLASTY Right    REMOVAL OF TISSUE EXPANDER AND PLACEMENT OF IMPLANT Left 03/03/2019   Procedure: REMOVAL OF TISSUE EXPANDER AND PLACEMENT OF EXPANDER;  Surgeon: Wallace Going, DO;  Location: [place];  Service: Plastics;  Laterality: Left;   REMOVAL OF TISSUE EXPANDER AND PLACEMENT OF IMPLANT Left 05/25/2019   Procedure: LEFT BREAST REMOVAL OF TISSUE EXPANDER AND PLACEMENT OF IMPLANT;  Surgeon: Wallace Going, DO;  Location: [place];  Service: Plastics;  Laterality: Left;   TISSUE EXPANDER PLACEMENT Left 11/03/2018   Procedure: PLACEMENT OF TISSUE EXPANDER LEFT BREAST;  Surgeon: Wallace Going, DO;  Location: [place];  Service: Plastics;  Laterality: Left;  Total  case time is 3.5 hours   TUBAL LIGATION Bilateral 01/21/2011   Patient Active Problem List   Diagnosis Date Noted   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   Boil of buttock, Left 12/24/2021   Hyperthyroidism 07/22/2021   Low serum thyroid stimulating hormone (TSH) 06/12/2021   Other fatigue 05/23/2021   Hemoglobin low 05/23/2021   Polyphagia 05/16/2020   Vitamin D deficiency 05/16/2020   At risk for osteoporosis 05/16/2020   Prediabetes  05/01/2020   Elevated blood pressure reading 05/01/2020   At risk for impaired metabolic function [id number]   Breast asymmetry following reconstructive surgery 09/20/2019   Essential hypertension 08/17/2018   Class 3 severe obesity with serious comorbidity and body mass index (BMI) of 40.0 to 44.9 in adult [facility]) 09/17/2017   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   Malignant neoplasm of upper-inner quadrant of left breast in female, estrogen receptor positive ([place]) 07/27/2017   Healthcare maintenance 03/25/2017   Malignant 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, estrogen receptor positive ([place]) 01/23/2017   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, left ([place]) 01/23/2017    PCP: Horald Pollen, MD  REFERRING PROVIDER: Donnamarie Rossetti PA-C  REFERRING DIAG: Post Mastectomy, Left upper back and UE pain  THERAPY DIAG:  Stiffness of left shoulder, not elsewhere classified  Status post left breast reconstruction  Abnormal posture  ONSET DATE: 11/03/2018 with recent exacerbation  Rationale for Evaluation and Treatment: Rehabilitation  SUBJECTIVE:                                                                                                                                                                                           SUBJECTIVE STATEMENT:  I am still doing really good. I had some rib tightness yesterday but it was also the first time I'd done my exercises for a few days so I know it was just from getting tight and trying to stretch again.   PERTINENT HISTORY:  Left breast cancer diagnosed 01/10/17 by mammogram and needle biopsy. Then had neo-adjuvant chemo starting 02/10/17 until 06/26/17; had left mastectomy 08/27/17 with ALND and immediate expander placed. Radiation completed. Expander removed and replaced on May 27, 19 b/c it did not get expanded enough prior to radiation, also had lat flap 11/03/18. Had expander removed and replaced again on  03/03/19 due to a small leak, pt had reduction on R breast at this time   PAIN:  Are you having pain? No ,  PRECAUTIONS: Other: left UE lymphedema risk  WEIGHT BEARING RESTRICTIONS: No  FALLS:  Has patient fallen in last 6 months? No  LIVING ENVIRONMENT: Lives  with: her 54 yr old and 14 yr old daughters Lives in: House/apartment Stairs: No; External: 2 steps; none Has following equipment at home: Crutches and bed side commode  OCCUPATION: [employer], [employer]  LEISURE: walk, watch movies, spend time with kids.  HAND DOMINANCE: right   PRIOR LEVEL OF FUNCTION: Independent  PATIENT GOALS: Get rid of sharp pain.   OBJECTIVE:  COGNITION: Overall cognitive status: Within functional limits for tasks assessed   PALPATION: Tender left scapular region, lateral trunk, pectorals, very tight UT  OBSERVATIONS / OTHER ASSESSMENTS: no visible swelling however pt with decreased muscle mass noted  left scapular region. Healed incision from lat flap with mild tenderness mid incision. Mild compensation and increased effort noted with left shoulder AROM  SENSATION: Light touch: Appears intact POSTURE: significant forward head, rounded shoulders  UPPER EXTREMITY AROM/PROM:  A/PROM RIGHT   eval   Shoulder extension 55  Shoulder flexion 140  Shoulder abduction 157  Shoulder internal rotation 70  Shoulder external rotation 95    (Blank rows = not tested)  A/PROM LEFT   eval LEFT 06/19/22 LEFT 07/03/2022 LEFT 07/24/2022  Shoulder extension 45     Shoulder flexion 132 158 152   Shoulder abduction 135 156 155 156  Shoulder internal rotation 54     Shoulder external rotation 75   90    (Blank rows = not tested)  CERVICAL AROM: All within functional  limits:   UPPER EXTREMITY STRENGTH:  Left grip 40,52,45; avg 45.6 Right grip 50,47,55 avg 50.66  LYMPHEDEMA ASSESSMENTS:  SURGERY TYPE/DATE: Left Modified Radical Mastectomy 08/27/2017, 11/03/2018 Left Lat Flap with tissue  expander, 05/25/2019 Removal of expander and placement of implant NUMBER OF LYMPH NODES REMOVED: 1/4 CHEMOTHERAPY: yes neoadjuvant RADIATION:yes HORMONE TREATMENT: yes, Letrozole INFECTIONS: NO  LYMPHEDEMA ASSESSMENTS:   LANDMARK RIGHT  eval RIGHT  06/10/2022  10 cm proximal to olecranon process 39.5 39.5  Olecranon process 30 30  10 $ cm proximal to ulnar styloid process 24 25.9  Just proximal to ulnar styloid process 17.5 17.7  Across hand at thumb web space 20 20  At base of 2nd digit 6.4 6.3  (Blank rows = not tested)  LANDMARK LEFT  eval LEFT 06/10/2022  10 cm proximal to olecranon process 39.5 40  Olecranon process 29.7 31  10 $ cm proximal to ulnar styloid process 25.5 26.4  Just proximal to ulnar styloid process 18.7 19  Across hand at thumb web space 19.6 20.7  At base of 2nd digit 6.4 6.1  (Blank rows = not tested)    QUICK DASH SURVEY: 31.82   The patient was assessed using the L-Dex machine today to produce a lymphedema index baseline score. The patient will be reassessed on a regular basis (typically every 3 months) to obtain new L-Dex scores. If the score is > 6.5 points away from his/her baseline score indicating onset of subclinical lymphedema, it will be recommended to wear a compression garment for 4 weeks, 12 hours per day and then be reassessed. If the score continues to be > 6.5 points from baseline at reassessment, we will initiate lymphedema treatment. Assessing in this manner has a 95% rate of preventing clinically significant lymphedema.  TODAY'S TREATMENT:  DATE: 07/29/22: Therapeutic Exercises Supine over full foam roll: 2# for following: horz abd, er, and alt flexion x10 each, then D2 with 1# (2# too heavy) x10 with VC's during to keep core engaged; then no weight for dead bug (alt UE /LE flex) x10 returning therapist  demo Bil low trunk rotation 3x, 20 sec holds and then bil HS stretch in supine 2x, 20 sec hoplds Manual Therapy STM to left pectorals in supine, and in Rt S/L to lats, also performed cupping in Rt S/L with cocoa butter to same PROM left shoulder flex, abd and D2, ER with scapular depression  07/24/2022  Supine over full foam roll with red band for supine scapular series x 10, occasional VC's Standing Jobes flexion and scaption core engaged, scap depressed 1# x 10 ea Quadruped stabilization alternating arm and leg x 5, Vcs to keep abs engaged. STM to left pectorals, lats, UT, and left intercostals and in SL to scapular region and lats with cupping in Rt S/L with cocoa butter PROM left shoulder flex, abd and D2, ER with scapular depression  07/22/22: Therapeutic Exercises Supine over full foam roll for supine scapular series with red theraband x10 each with min VC's for technique and to keep core engaged throughout Supine on large mat table for core stabs: Pelvic Tilt x10, 5 sec holds returning therapist demo, then hold tilt for following: Open/close knees like a clam 2 x 5, then alt march 2 x 5 and then SAQ (unable to heel slide and hold tilt but pt educated how to progress with this)  Quadruped for alt UE flex/LE ext x 5 each returning therapist demo and VC's to keep core engaged throughout  Yellow ball roll up wall into flexion and Bil abd x 10 each; also wall slide into flexion with Lt UE and end with Rt trunk twist for increased Lt axillary stretch Manual Therapy PROM left shoulder flex, abd and D2 with scapular depression by therapist throughout STM with cocoa butter during P/ROM at Lt pectoralis insertion and lats where pt still palpably tight but much improved since this therapist saw her last     PATIENT EDUCATION:    HOME EXERCISE PROGRAM: Access Code: [email or web address] URL: [email or web address] Date: 07/17/2022 Prepared by: Cheral Almas  Exercises - Scapular  Retraction with Resistance  - 1 x daily - 3 x weekly - 1 sets - 10 reps - Shoulder Extension with Resistance  - 1 x daily - 3 x weekly - 1 sets - 10 reps - Shoulder External Rotation and Scapular Retraction with Resistance  - 1 x daily - 3 x weekly - 1 sets - 10 reps  Supine Shoulder Flexion AAROM with Hands Clasped  - 2 x daily - 7 x weekly - 1 sets - 5 reps - 5 hold - Supine Chest Stretch with Arms Behind Head  - 2 x daily - 7 x weekly - 1 sets - 5 reps - 5 hold - Standing Shoulder Abduction Wall Slide with Thumb Out  - 2 x daily - 7 x weekly - 1 sets - 5 reps - 5 hold Person educated: Patient Access Code: [email or web address] URL: [email or web address] Date: 06/12/2022 Prepared by: Collie Siad  Exercises Access Code: [email or web address] URL: [email or web address] Date: 06/19/2022 Prepared by: Manus Gunning  Exercises - Supine Shoulder Flexion AAROM with Hands Clasped  - 2 x daily - 7 x weekly - 1 sets - 5 reps - 5 hold - Supine Chest Stretch with Arms Behind Head  - 2 x  daily - 7 x weekly - 1 sets - 5 reps - 5 hold - Standing Shoulder Abduction Wall Slide with Thumb Out  - 2 x daily - 7 x weekly - 1 sets - 5 reps - 5 hold - Latissimus Dorsi Stretch at Wall  - 2-3 x daily - 7 x weekly - 5 reps - 5-10 hold - Supine Lower Trunk Rotation  - 2 x daily - 7 x weekly - 1 sets - 10 reps - 10-20 hold - Shoulder extension with resistance - Neutral  - 1 x daily - 5 x weekly - 1-2 sets - 10 reps - 3 hold - Downward Dog  - 1 x daily - 7 x weekly - 5 reps - 5 hold - Tennis Ball Self Massage on Wall  - 1 x daily - 7 x weekly - 1 sets - 10 reps - Supine Shoulder External Rotation with Resistance  - 1 x daily - 4-5 x weekly - 1 sets - 10 reps - Supine Shoulder Horizontal Abduction with Resistance  - 1 x daily - 4-5 x weekly - 1 sets - 10 reps - Supine narrow and wide grip flexion  - 1 x daily - 4-5 x weekly - 1-2 sets - 10 reps - 3 sec hold - Supine PNF D2   - 1 x daily - 4-5 x  weekly - 1-2 sets - 10 reps - 3 sec hold  07/22/22 - Added following to her Medbridge - Supine Posterior Pelvic Tilt  - 1-2 x daily - 7 x weekly - 1 sets - 10 reps - 5 hold (with notes to include clam and alt SAQ) - Supine March with Posterior Pelvic Tilt  - 1 x daily - 7 x weekly - 2 sets - 5 reps (working towards heel slide once stronger) - Bird Dog  - 1 x daily - 7 x weekly - 2 sets - 5 reps - 3 hold  ASSESSMENT:  CLINICAL IMPRESSION:  Pt conts to report great progress with decreased pain, increased function. Progressed core exercises to include weights continuing to use foam roll for core stability as well. Then ended session with low back and hip flexibility and manual therapy. Probable D/C per POC next visit. Pt agrees.    OBJECTIVE IMPAIRMENTS: decreased ROM, decreased strength, impaired UE functional use, postural dysfunction, and pain.   ACTIVITY LIMITATIONS: sleeping, reach over head, and hygiene/grooming  PARTICIPATION LIMITATIONS:  She participates in all with occasional sharp pains  PERSONAL FACTORS: 1-2 comorbidities: Left mastectomy with reconstruction and expanders  are also affecting patient's functional outcome.   REHAB POTENTIAL: Good  CLINICAL DECISION MAKING: Stable/uncomplicated  EVALUATION COMPLEXITY: Low  GOALS: Goals reviewed with patient? Yes LONG TERM GOALS: Target date: 08/01/2022  Pt will have left shoulder abduction 150 for improved reaching and decreased tightness Baseline: 135 Goal status: MET 07/03/2022  2.  Pt will have improved ER on left to 88 degrees for decreased pectoral tightness Baseline: 75 Goal status: MET 07/24/2022 3.  Pt will improve quick dash to no greater than 15% Baseline: 31.82 Goal status: INITIAL  4.  Pt will have decreased tenderness/sharp pain by atleast 50% Baseline:  Goal status:MET 08/03/2022 PLAN:  PT FREQUENCY: 1-2x/week  PT DURATION: 8 weeks  PLANNED INTERVENTIONS: Therapeutic exercises, Neuromuscular  re-education, Patient/Family education, Self Care, Joint mobilization, Orthotic/Fit training, Dry Needling, and Manual therapy  PLAN FOR NEXT SESSION: D/C next per POC; review current Medbridge HEP and see if any exs need to be removed  due to increased ease (like initial stretches after surgery) measure circumference of arms ,  Monitor left arm for lymphedema,      Collie Siad, PTA 07/29/22 12:33 PM   PELVIC TILT: Posterior    Tighten abdominals, flatten low back. __10_ reps per set, hold for __5__ sec, do __1-2_ sets per day. For following hold tilt and add: Open/close like a clam shell  Alt marching keeping knees bent Alternate kick out (working towards heel slide as you get stronger) All 2 x 5, working towards 10 reps before relaxing tilt.    Upper / Lower Extremity Extension (All-Fours)    Tighten stomach by doing a pelvic tilt as described above and raise right leg and opposite arm. Keep trunk rigid. Do not twist at hips or arch low back and keep knee facing down at floor.  Repeat _5___ times per set. Do __2__ sets per session. Do __1-2__ sessions per day.    Cancer Rehab [phone]

## 2022-07-31 ENCOUNTER — Ambulatory Visit: Payer: 59

## 2022-07-31 DIAGNOSIS — R293 Abnormal posture: Secondary | ICD-10-CM

## 2022-07-31 DIAGNOSIS — M25612 Stiffness of left shoulder, not elsewhere classified: Secondary | ICD-10-CM

## 2022-07-31 DIAGNOSIS — Z9889 Other specified postprocedural states: Secondary | ICD-10-CM | POA: Diagnosis not present

## 2022-07-31 NOTE — Therapy (Addendum)
OUTPATIENT PHYSICAL THERAPY ONCOLOGY TREATMENT  Patient Name: Mackenzie [name] MRN: [id number] DOB:October 06, 1970, 52 y.o., female Today's Date: 07/31/2022  END OF SESSION:  PT End of Session - 07/31/22 0922     Visit Number 16    Number of Visits 16    Date for PT Re-Evaluation 08/01/22    PT Start Time 0913   pt arrived late   PT Stop Time 1002    PT Time Calculation (min) 49 min    Activity Tolerance Patient tolerated treatment well    Behavior During Therapy [facility] for tasks assessed/performed              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   Allergic rhinitis 2012   Anemia 01/27/2017   prior to starting chemotherapy   Breast cancer ([place]) 01/14/2017   Left breast   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, left ([place]) 01/23/2017   Edema, lower extremity    Healthcare maintenance 03/25/2017   Hot flashes 03/2017   Hyperlipidemia 03/20/2016   Morbid obesity with body mass index (BMI) of 40.0 to 49.9 ([place]) 09/17/2017   Pre-diabetes 07/26/2018   Hgb A1C elevated on 07/26/2018, Gestational Diabetes 2012   Seasonal allergies 2012   seasonal allergies causes allergic rhinitis and itchy, dry eyes per pt   Vitamin D deficiency 05/2015   Past Surgical History:  Procedure Laterality Date   BREAST RECONSTRUCTION WITH PLACEMENT OF TISSUE EXPANDER AND FLEX HD (ACELLULAR HYDRATED DERMIS) Left 08/27/2017   Procedure: LEFT BREAST RECONSTRUCTION WITH PLACEMENT OF TISSUE EXPANDER AND FLEX HD;  Surgeon: Wallace Going, DO;  Location: [place];  Service: Plastics;  Laterality: Left;   CESAREAN SECTION     x2   LATISSIMUS FLAP TO BREAST Left 11/03/2018   LATISSIMUS FLAP TO BREAST Left 11/03/2018   Procedure: LATISSIMUS FLAP TO LEFT BREAST;  Surgeon: Wallace Going, DO;  Location: [place];  Service: Plastics;  Laterality: Left;   MASTECTOMY MODIFIED RADICAL Left 08/27/2017   MASTECTOMY MODIFIED  RADICAL Left 08/27/2017   Procedure: LEFT MODIFIED RADICAL MASTECTOMY;  Surgeon: Erroll Luna, MD;  Location: [place];  Service: General;  Laterality: Left;   MASTOPEXY Right 03/03/2019   Procedure: RIGHT BREAST MASTOPEXY/REDUCTION;  Surgeon: Wallace Going, DO;  Location: [place];  Service: Plastics;  Laterality: Right;  3 hours, please   PORT-A-CATH REMOVAL Right 08/27/2017   Procedure: REMOVAL PORT-A-CATH RIGHT CHEST;  Surgeon: Erroll Luna, MD;  Location: [place];  Service: General;  Laterality: Right;   PORTACATH PLACEMENT Right 01/28/2017   Procedure: INSERTION PORT-A-CATH WITH ULTRASOUND;  Surgeon: Erroll Luna, MD;  Location: [place];  Service: General;  Laterality: Right;   REDUCTION MAMMAPLASTY Right    REMOVAL OF TISSUE EXPANDER AND PLACEMENT OF IMPLANT Left 03/03/2019   Procedure: REMOVAL OF TISSUE EXPANDER AND PLACEMENT OF EXPANDER;  Surgeon: Wallace Going, DO;  Location: [place];  Service: Plastics;  Laterality: Left;   REMOVAL OF TISSUE EXPANDER AND PLACEMENT OF IMPLANT Left 05/25/2019   Procedure: LEFT BREAST REMOVAL OF TISSUE EXPANDER AND PLACEMENT OF IMPLANT;  Surgeon: Wallace Going, DO;  Location: [place];  Service: Plastics;  Laterality: Left;   TISSUE EXPANDER PLACEMENT Left 11/03/2018   Procedure: PLACEMENT OF TISSUE EXPANDER LEFT BREAST;  Surgeon: Wallace Going, DO;  Location: [place];  Service: Plastics;  Laterality: Left;  Total  case time is 3.5 hours   TUBAL LIGATION Bilateral 01/21/2011   Patient Active Problem List   Diagnosis Date Noted   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   Boil of buttock, Left 12/24/2021   Hyperthyroidism 07/22/2021   Low serum thyroid stimulating hormone (TSH) 06/12/2021   Other fatigue 05/23/2021   Hemoglobin low 05/23/2021   Polyphagia 05/16/2020   Vitamin D deficiency 05/16/2020   At risk for osteoporosis 05/16/2020   Prediabetes  05/01/2020   Elevated blood pressure reading 05/01/2020   At risk for impaired metabolic function [id number]   Breast asymmetry following reconstructive surgery 09/20/2019   Essential hypertension 08/17/2018   Class 3 severe obesity with serious comorbidity and body mass index (BMI) of 40.0 to 44.9 in adult [facility]) 09/17/2017   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   Malignant neoplasm of upper-inner quadrant of left breast in female, estrogen receptor positive ([place]) 07/27/2017   Healthcare maintenance 03/25/2017   Malignant 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, estrogen receptor positive ([place]) 01/23/2017   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, left ([place]) 01/23/2017    PCP: Horald Pollen, MD  REFERRING PROVIDER: Donnamarie Rossetti PA-C  REFERRING DIAG: Post Mastectomy, Left upper back and UE pain  THERAPY DIAG:  Stiffness of left shoulder, not elsewhere classified  Status post left breast reconstruction  Abnormal posture  ONSET DATE: 11/03/2018 with recent exacerbation  Rationale for Evaluation and Treatment: Rehabilitation  SUBJECTIVE:                                                                                                                                                                                           SUBJECTIVE STATEMENT:  I still have an occasional pain when I twist funny like reaching into the back of the car. But overall I am feeling much better! Ready to make today my last visit. Marland Kitchen    PERTINENT HISTORY:  Left breast cancer diagnosed 01/10/17 by mammogram and needle biopsy. Then had neo-adjuvant chemo starting 02/10/17 until 06/26/17; had left mastectomy 08/27/17 with ALND and immediate expander placed. Radiation completed. Expander removed and replaced on May 27, 19 b/c it did not get expanded enough prior to radiation, also had lat flap 11/03/18. Had expander removed and replaced again on 03/03/19 due to a small leak, pt had  reduction on R breast at this time   PAIN:  Are you having pain? No ,  PRECAUTIONS: Other: left UE lymphedema risk  WEIGHT BEARING RESTRICTIONS: No  FALLS:  Has patient fallen in last 6 months? No  LIVING ENVIRONMENT: Lives with: her 54 yr old and 26  yr old daughters Lives in: House/apartment Stairs: No; External: 2 steps; none Has following equipment at home: Crutches and bed side commode  OCCUPATION: [employer] [place], Financial controller  LEISURE: walk, watch movies, spend time with kids.  HAND DOMINANCE: right   PRIOR LEVEL OF FUNCTION: Independent  PATIENT GOALS: Get rid of sharp pain.   OBJECTIVE:  COGNITION: Overall cognitive status: Within functional limits for tasks assessed   PALPATION: Tender left scapular region, lateral trunk, pectorals, very tight UT  OBSERVATIONS / OTHER ASSESSMENTS: no visible swelling however pt with decreased muscle mass noted  left scapular region. Healed incision from lat flap with mild tenderness mid incision. Mild compensation and increased effort noted with left shoulder AROM  SENSATION: Light touch: Appears intact POSTURE: significant forward head, rounded shoulders  UPPER EXTREMITY AROM/PROM:  A/PROM RIGHT   eval   Shoulder extension 55  Shoulder flexion 140  Shoulder abduction 157  Shoulder internal rotation 70  Shoulder external rotation 95    (Blank rows = not tested)  A/PROM LEFT   eval LEFT 06/19/22 LEFT 07/03/2022 LEFT 07/24/2022 Left 07/31/22  Shoulder extension 45      Shoulder flexion 132 158 152  158  Shoulder abduction 135 156 155 156 162  Shoulder internal rotation 54      Shoulder external rotation 75   90 90    (Blank rows = not tested)  CERVICAL AROM: All within functional  limits:   UPPER EXTREMITY STRENGTH:  Left grip 40,52,45; avg 45.6 Right grip 50,47,55 avg 50.66  LYMPHEDEMA ASSESSMENTS:  SURGERY TYPE/DATE: Left Modified Radical Mastectomy 08/27/2017, 11/03/2018 Left Lat Flap with tissue  expander, 05/25/2019 Removal of expander and placement of implant NUMBER OF LYMPH NODES REMOVED: 1/4 CHEMOTHERAPY: yes neoadjuvant RADIATION:yes HORMONE TREATMENT: yes, Letrozole INFECTIONS: NO  LYMPHEDEMA ASSESSMENTS:   LANDMARK RIGHT  eval RIGHT  06/10/2022  10 cm proximal to olecranon process 39.5 39.5  Olecranon process 30 30  10 $ cm proximal to ulnar styloid process 24 25.9  Just proximal to ulnar styloid process 17.5 17.7  Across hand at thumb web space 20 20  At base of 2nd digit 6.4 6.3  (Blank rows = not tested)  LANDMARK LEFT  eval LEFT 06/10/2022  10 cm proximal to olecranon process 39.5 40  Olecranon process 29.7 31  10 $ cm proximal to ulnar styloid process 25.5 26.4  Just proximal to ulnar styloid process 18.7 19  Across hand at thumb web space 19.6 20.7  At base of 2nd digit 6.4 6.1  (Blank rows = not tested)    QUICK DASH SURVEY: 31.82   The patient was assessed using the L-Dex machine today to produce a lymphedema index baseline score. The patient will be reassessed on a regular basis (typically every 3 months) to obtain new L-Dex scores. If the score is > 6.5 points away from his/her baseline score indicating onset of subclinical lymphedema, it will be recommended to wear a compression garment for 4 weeks, 12 hours per day and then be reassessed. If the score continues to be > 6.5 points from baseline at reassessment, we will initiate lymphedema treatment. Assessing in this manner has a 95% rate of preventing clinically significant lymphedema.  TODAY'S TREATMENT:  DATE:  07/31/22: Therapeutic Exercises Supine over full foam roll: 3# for following: horz abd, er, and alt flexion 2 x 10 each with rest break between sets, then D2 with 2# 2 x 10 (1# second set) with VC's during to keep core engaged; then no weight for dead bug (alt UE  /LE flex) 2 x 10 VC's to remind of correct technique In quadruped for cat/camel stretch 5x each with 5 sec holds, then child's pose front, and to each side for lateral trunk stretches 3x each with 10 sec holds returning therapist demo for each. Pt reports feeling good stretches with these.  Manual Therapy STM to left pectorals in supine during P/ROM with cocoa butter PROM left shoulder flex and abd with scapular depression briefly at end of session  07/29/22: Therapeutic Exercises Supine over full foam roll: 2# for following: horz abd, er, and alt flexion x10 each, then D2 with 1# (2# too heavy) x10 with VC's during to keep core engaged; then no weight for dead bug (alt UE /LE flex) x10 returning therapist demo Bil low trunk rotation 3x, 20 sec holds and then bil HS stretch in supine 2x, 20 sec hoplds Manual Therapy STM to left pectorals in supine, and in Rt S/L to lats, also performed cupping in Rt S/L with cocoa butter to same PROM left shoulder flex, abd and D2, ER with scapular depression  07/24/2022  Supine over full foam roll with red band for supine scapular series x 10, occasional VC's Standing Jobes flexion and scaption core engaged, scap depressed 1# x 10 ea Quadruped stabilization alternating arm and leg x 5, Vcs to keep abs engaged. STM to left pectorals, lats, UT, and left intercostals and in SL to scapular region and lats with cupping in Rt S/L with cocoa butter PROM left shoulder flex, abd and D2, ER with scapular depression     PATIENT EDUCATION:    HOME EXERCISE PROGRAM: Access Code: [email or web address] URL: [email or web address] Date: 07/17/2022 Prepared by: Cheral Almas  Exercises - Scapular Retraction with Resistance  - 1 x daily - 3 x weekly - 1 sets - 10 reps - Shoulder Extension with Resistance  - 1 x daily - 3 x weekly - 1 sets - 10 reps - Shoulder External Rotation and Scapular Retraction with Resistance  - 1 x daily - 3 x weekly - 1 sets - 10 reps   Supine Shoulder Flexion AAROM with Hands Clasped  - 2 x daily - 7 x weekly - 1 sets - 5 reps - 5 hold - Supine Chest Stretch with Arms Behind Head  - 2 x daily - 7 x weekly - 1 sets - 5 reps - 5 hold - Standing Shoulder Abduction Wall Slide with Thumb Out  - 2 x daily - 7 x weekly - 1 sets - 5 reps - 5 hold Person educated: Patient Access Code: [email or web address] URL: [email or web address] Date: 06/12/2022 Prepared by: Collie Siad  Exercises Access Code: [email or web address] URL: [email or web address] Date: 06/19/2022 Prepared by: Manus Gunning  Exercises - Supine Shoulder Flexion AAROM with Hands Clasped  - 2 x daily - 7 x weekly - 1 sets - 5 reps - 5 hold - Supine Chest Stretch with Arms Behind Head  - 2 x daily - 7 x weekly - 1 sets - 5 reps - 5 hold - Standing Shoulder Abduction Wall Slide with Thumb Out  - 2 x daily - 7 x weekly - 1 sets - 5 reps - 5 hold - Latissimus Dorsi  Stretch at [employer]  - 2-3 x daily - 7 x weekly - 5 reps - 5-10 hold - Supine Lower Trunk Rotation  - 2 x daily - 7 x weekly - 1 sets - 10 reps - 10-20 hold - Shoulder extension with resistance - Neutral  - 1 x daily - 5 x weekly - 1-2 sets - 10 reps - 3 hold - Downward Dog  - 1 x daily - 7 x weekly - 5 reps - 5 hold - Tennis Ball Self Massage on Wall  - 1 x daily - 7 x weekly - 1 sets - 10 reps - Supine Shoulder External Rotation with Resistance  - 1 x daily - 4-5 x weekly - 1 sets - 10 reps - Supine Shoulder Horizontal Abduction with Resistance  - 1 x daily - 4-5 x weekly - 1 sets - 10 reps - Supine narrow and wide grip flexion  - 1 x daily - 4-5 x weekly - 1-2 sets - 10 reps - 3 sec hold - Supine PNF D2   - 1 x daily - 4-5 x weekly - 1-2 sets - 10 reps - 3 sec hold  07/22/22 - Added following to her Medbridge - Supine Posterior Pelvic Tilt  - 1-2 x daily - 7 x weekly - 1 sets - 10 reps - 5 hold (with notes to include clam and alt SAQ) - Supine March with Posterior Pelvic Tilt  - 1 x daily -  7 x weekly - 2 sets - 5 reps (working towards heel slide once stronger) - Bird Dog  - 1 x daily - 7 x weekly - 2 sets - 5 reps - 3 hold  ASSESSMENT:  CLINICAL IMPRESSION:  Pt has made excellent progress with this episode of care and is ready for D/C at this time. She has met all goals except Quick DASH which improved and was near goal.    OBJECTIVE IMPAIRMENTS: decreased ROM, decreased strength, impaired UE functional use, postural dysfunction, and pain.   ACTIVITY LIMITATIONS: sleeping, reach over head, and hygiene/grooming  PARTICIPATION LIMITATIONS:  She participates in all with occasional sharp pains  PERSONAL FACTORS: 1-2 comorbidities: Left mastectomy with reconstruction and expanders  are also affecting patient's functional outcome.   REHAB POTENTIAL: Good  CLINICAL DECISION MAKING: Stable/uncomplicated  EVALUATION COMPLEXITY: Low  GOALS: Goals reviewed with patient? Yes LONG TERM GOALS: Target date: 08/01/2022  Pt will have left shoulder abduction 150 for improved reaching and decreased tightness Baseline: 135; 162 degrees -07/31/22 Goal status: MET 07/03/2022  2.  Pt will have improved ER on left to 88 degrees for decreased pectoral tightness Baseline: 75; 90 degrees - 07/31/22 Goal status: MET 07/24/2022  3.  Pt will improve quick dash to no greater than 15% Baseline: 31.82; 18.18 - -07/31/22 Goal status: PARTIALLY MET  4.  Pt will have decreased tenderness/sharp pain by at least 50% Baseline: 85-90% improvement - 07/31/22 Goal status:MET 07/24/2022 PLAN:  PT FREQUENCY: 1-2x/week  PT DURATION: 8 weeks  PLANNED INTERVENTIONS: Therapeutic exercises, Neuromuscular re-education, Patient/Family education, Self Care, Joint mobilization, Orthotic/Fit training, Dry Needling, and Manual therapy  PLAN FOR NEXT SESSION: D/C this visit.    PHYSICAL THERAPY DISCHARGE SUMMARY  Visits from Start of Care: 16  Current functional level related to goals / functional  outcomes: Achieved all goals except quick dash which was greatly improved and nearly achieved   Remaining deficits: Occasional pain if she twists wrong, otherwise greatly improved   Education / Equipment: theraband  Patient agrees to discharge. Patient goals were partially met. Patient is being discharged due to being pleased with the current functional level.   Collie Siad, PTA 07/31/22 10:05 AM   PELVIC TILT: Posterior    Tighten abdominals, flatten low back. __10_ reps per set, hold for __5__ sec, do __1-2_ sets per day. For following hold tilt and add: Open/close like a clam shell  Alt marching keeping knees bent Alternate kick out (working towards heel slide as you get stronger) All 2 x 5, working towards 10 reps before relaxing tilt.    Upper / Lower Extremity Extension (All-Fours)    Tighten stomach by doing a pelvic tilt as described above and raise right leg and opposite arm. Keep trunk rigid. Do not twist at hips or arch low back and keep knee facing down at floor.  Repeat _5___ times per set. Do __2__ sets per session. Do __1-2__ sessions per day.    Cancer Rehab [phone]

## 2022-08-01 DIAGNOSIS — Z9011 Acquired absence of right breast and nipple: Secondary | ICD-10-CM | POA: Diagnosis not present

## 2022-08-01 DIAGNOSIS — C50912 Malignant neoplasm of unspecified site of left female breast: Secondary | ICD-10-CM | POA: Diagnosis not present

## 2022-08-04 ENCOUNTER — Other Ambulatory Visit (HOSPITAL_COMMUNITY): Payer: Self-pay

## 2022-08-04 ENCOUNTER — Telehealth: Payer: Self-pay | Admitting: *Deleted

## 2022-08-04 MED ORDER — ROSUVASTATIN CALCIUM 10 MG PO TABS
10.0000 mg | ORAL_TABLET | Freq: Every day | ORAL | 0 refills | Status: DC
  Filled 2022-08-04: qty 90, 90d supply, fill #0

## 2022-08-04 MED ORDER — SPIRONOLACTONE 25 MG PO TABS
25.0000 mg | ORAL_TABLET | Freq: Every day | ORAL | 0 refills | Status: DC
  Filled 2022-08-04: qty 90, 90d supply, fill #0

## 2022-08-04 MED ORDER — MONTELUKAST SODIUM 10 MG PO TABS
10.0000 mg | ORAL_TABLET | Freq: Every day | ORAL | 0 refills | Status: DC
  Filled 2022-08-04: qty 90, 90d supply, fill #0

## 2022-08-04 NOTE — Telephone Encounter (Signed)
Left message for patient reminding her to schedule her mammogram.  The soonest insurance will allow is probably 11/07/22 since that is one year from her last mammogram.  Foye Spurling, BSN, RN, [place] [facility] [phone] 08/04/2022 10:31 AM

## 2022-08-07 ENCOUNTER — Other Ambulatory Visit: Payer: 59

## 2022-08-07 ENCOUNTER — Ambulatory Visit (INDEPENDENT_AMBULATORY_CARE_PROVIDER_SITE_OTHER): Payer: 59 | Admitting: Adult Health

## 2022-08-07 ENCOUNTER — Ambulatory Visit (INDEPENDENT_AMBULATORY_CARE_PROVIDER_SITE_OTHER): Payer: Commercial Managed Care - PPO | Admitting: Adult Health

## 2022-08-07 ENCOUNTER — Ambulatory Visit: Payer: 59 | Admitting: Hematology and Oncology

## 2022-08-13 ENCOUNTER — Encounter (INDEPENDENT_AMBULATORY_CARE_PROVIDER_SITE_OTHER): Payer: Self-pay | Admitting: Family Medicine

## 2022-08-13 ENCOUNTER — Ambulatory Visit (INDEPENDENT_AMBULATORY_CARE_PROVIDER_SITE_OTHER): Payer: 59 | Admitting: Family Medicine

## 2022-08-13 ENCOUNTER — Other Ambulatory Visit (HOSPITAL_COMMUNITY): Payer: Self-pay

## 2022-08-13 VITALS — BP 117/75 | HR 82 | Temp 97.9°F | Ht 65.0 in | Wt 240.0 lb

## 2022-08-13 DIAGNOSIS — E669 Obesity, unspecified: Secondary | ICD-10-CM

## 2022-08-13 DIAGNOSIS — F32A Depression, unspecified: Secondary | ICD-10-CM | POA: Insufficient documentation

## 2022-08-13 DIAGNOSIS — F3289 Other specified depressive episodes: Secondary | ICD-10-CM

## 2022-08-13 DIAGNOSIS — C50912 Malignant neoplasm of unspecified site of left female breast: Secondary | ICD-10-CM | POA: Diagnosis not present

## 2022-08-13 DIAGNOSIS — Z9011 Acquired absence of right breast and nipple: Secondary | ICD-10-CM | POA: Diagnosis not present

## 2022-08-13 DIAGNOSIS — Z6841 Body Mass Index (BMI) 40.0 and over, adult: Secondary | ICD-10-CM | POA: Diagnosis not present

## 2022-08-13 MED ORDER — BUPROPION HCL ER (SR) 150 MG PO TB12
150.0000 mg | ORAL_TABLET | Freq: Every day | ORAL | 0 refills | Status: DC
  Filled 2022-08-13: qty 30, 30d supply, fill #0

## 2022-08-26 NOTE — Progress Notes (Signed)
Chief Complaint:   OBESITY Mackenzie [name] is here to discuss her progress with her obesity treatment plan along with follow-up of her obesity related diagnoses. Mackenzie [name] is on the Category 3 Plan and states she is following her eating plan approximately 80% of the time. [name] states she is walking for 20-30 minutes 2-3 times per week.  Today's visit was #: 53 Starting weight: 267 lbs Starting date: 08/09/2019 Today's weight: 240 lbs Today's date: 08/13/2022 Total lbs lost to date: 27 Total lbs lost since last in-office visit: 0  Interim History: Decline continues to work on her diet but she has struggled more. She notes some increase in emotional eating behaviors.   Subjective:   1. Emotional Eating Behavior Decline notes increased emotional eating behaviors recently and she is struggling to stay on track with her eating plan. She is open to looking at medications options. She has no history of seizure disorder.   Assessment/Plan:   1. Emotional Eating Behavior Decline agreed to start Wellbutrin SR 150 mg q AM with no refills.   - buPROPion (WELLBUTRIN SR) 150 MG 12 hr tablet; Take 1 tablet (150 mg total) by mouth daily.  Dispense: 30 tablet; Refill: 0  2. BMI 40.0-44.9, adult (HCC)  3. Obesity, Beginning BMI 44.43 Mackenzie [name] is currently in the action stage of change. As such, her goal is to continue with weight loss efforts. She has agreed to the Category 3 Plan.   Behavioral modification strategies: meal planning and cooking strategies and emotional eating strategies.  Mackenzie [name] has agreed to follow-up with our clinic in 4 weeks. She was informed of the importance of frequent follow-up visits to maximize her success with intensive lifestyle modifications for her multiple health conditions.   Objective:   Blood pressure 117/75, pulse 82, temperature 97.9 F (36.6 C), height 5\' 5"  (1.651 m), weight 240 lb (108.9 kg), last menstrual period 11/28/2016, SpO2 98 %, unknown if currently  breastfeeding. Body mass index is 39.94 kg/m.  Lab Results  Component Value Date   CREATININE 0.88 01/29/2022   BUN 16 01/29/2022   NA 137 01/29/2022   K 4.5 01/29/2022   CL 101 01/29/2022   CO2 23 01/29/2022   Lab Results  Component Value Date   ALT 15 01/29/2022   AST 16 01/29/2022   GGT 19 03/01/2021   ALKPHOS 177 (H) 01/29/2022   BILITOT 0.3 01/29/2022   Lab Results  Component Value Date   HGBA1C 5.4 01/29/2022   HGBA1C 5.4 12/19/2021   HGBA1C 5.4 05/23/2021   HGBA1C 5.4 02/27/2021   HGBA1C 5.4 11/15/2020   Lab Results  Component Value Date   INSULIN 21.3 01/29/2022   INSULIN 18.2 01/07/2022   INSULIN 15.1 05/23/2021   INSULIN 15.1 02/27/2021   INSULIN 11.7 11/15/2020   Lab Results  Component Value Date   TSH 4.94 07/14/2022   Lab Results  Component Value Date   CHOL 202 (H) 11/15/2020   HDL 58 11/15/2020   LDLCALC 135 (H) 11/15/2020   TRIG 52 11/15/2020   CHOLHDL 3.5 11/15/2020   Lab Results  Component Value Date   VD25OH 50.2 01/29/2022   VD25OH 49.2 12/19/2021   VD25OH 102.0 (H) 05/23/2021   Lab Results  Component Value Date   WBC 3.8 (L) 07/23/2021   HGB 11.5 (L) 07/23/2021   HCT 35.9 (L) 07/23/2021   MCV 79.6 07/23/2021   PLT 201.0 07/23/2021   No results found for: "IRON", "TIBC", "FERRITIN"  Attestation Statements:   Reviewed  by clinician on day of visit: allergies, medications, problem list, medical history, surgical history, family history, social history, and previous encounter notes.  Time spent on visit including pre-visit chart review and post-visit care and charting was 30 minutes.   I, Trixie Dredge, am acting as transcriptionist for Dennard Nip, MD.  I have reviewed the above documentation for accuracy and completeness, and I agree with the above. -  Dennard Nip, MD

## 2022-08-28 ENCOUNTER — Other Ambulatory Visit (HOSPITAL_COMMUNITY): Payer: Self-pay

## 2022-08-29 ENCOUNTER — Telehealth (INDEPENDENT_AMBULATORY_CARE_PROVIDER_SITE_OTHER): Payer: 59 | Admitting: Student

## 2022-08-29 DIAGNOSIS — Z9889 Other specified postprocedural states: Secondary | ICD-10-CM

## 2022-08-29 NOTE — Progress Notes (Signed)
   Referring Provider [name], MD [place],  [place] [address]   CC: Follow-up after physical therapy     Mackenzie [name] is an 52 y.o. female.  HPI: Patient is a 52 year old female with history of left breast cancer found in 2018.  She underwent chemotherapy followed by mastectomy and radiation.  Patient subsequently underwent latissimus flap in May 2020 with expander placement followed by an implant placement.  The implant was placed in December 2020 and it was a Product manager ultrahigh profile 750 cc gel implant.  Patient was last seen in the clinic on 06/20/2022.  At this visit, patient reported she was doing well.  She stated that she had started physical therapy and completed about 5 sessions.  She stated that she felt that physical therapy was improving her back pain.  She denied any other issues or concerns.  Plan was for patient to follow-up in 2 months after she finished physical therapy.  Today, patient reports she is doing well.  She states that she completed all of her physical therapy.  She states that her back pain has resolved.  She states that physical therapy has greatly helped with her back pain.  She denies any other issues or concerns at this time.  Review of Systems General: No recent changes in her health  Physical Exam Patient is speaking in full and clear sentences.  There is no distress based off the patient's speech.   Assessment/Plan  S/P breast reconstruction   Patient reports she is doing well after completing physical therapy.  She states that she was given several exercises to continue to help with her pain.  She sounds motivated to continue to do these to help alleviate her pain.  I discussed with the patient that we will have her follow-up at her scheduled visit in December.  I discussed with her that if she has any issues or concerns before then, to let us know.  Patient expressed understanding was in agreement with this  plan.  The patient gave consent to have this visit done by telemedicine / virtual visit, two identifiers were used to identify patient. This is also consent for access the chart and treat the patient via this visit. The patient is located in [place].  I, the provider, am at the office.  We spent 3 minutes together for the visit.  Joined by telephone.   Clance Boll 08/29/2022, 12:35 PM

## 2022-09-03 ENCOUNTER — Encounter (INDEPENDENT_AMBULATORY_CARE_PROVIDER_SITE_OTHER): Payer: Self-pay | Admitting: Adult Health

## 2022-09-03 ENCOUNTER — Other Ambulatory Visit (HOSPITAL_COMMUNITY): Payer: Self-pay

## 2022-09-03 ENCOUNTER — Ambulatory Visit (INDEPENDENT_AMBULATORY_CARE_PROVIDER_SITE_OTHER): Payer: 59 | Admitting: Adult Health

## 2022-09-03 DIAGNOSIS — E669 Obesity, unspecified: Secondary | ICD-10-CM

## 2022-09-03 DIAGNOSIS — Z6841 Body Mass Index (BMI) 40.0 and over, adult: Secondary | ICD-10-CM

## 2022-09-03 DIAGNOSIS — Z0000 Encounter for general adult medical examination without abnormal findings: Secondary | ICD-10-CM

## 2022-09-03 DIAGNOSIS — E559 Vitamin D deficiency, unspecified: Secondary | ICD-10-CM

## 2022-09-03 DIAGNOSIS — Z6839 Body mass index (BMI) 39.0-39.9, adult: Secondary | ICD-10-CM

## 2022-09-03 DIAGNOSIS — R7303 Prediabetes: Secondary | ICD-10-CM

## 2022-09-03 DIAGNOSIS — I10 Essential (primary) hypertension: Secondary | ICD-10-CM

## 2022-09-03 DIAGNOSIS — F3289 Other specified depressive episodes: Secondary | ICD-10-CM

## 2022-09-03 MED ORDER — BUPROPION HCL ER (SR) 150 MG PO TB12
150.0000 mg | ORAL_TABLET | Freq: Every day | ORAL | 0 refills | Status: DC
  Filled 2022-09-03 – 2022-09-15 (×2): qty 30, 30d supply, fill #0

## 2022-09-03 NOTE — Progress Notes (Signed)
WEIGHT SUMMARY AND BIOMETRICS  Vitals Temp: 98.2 F (36.8 C) BP: 115/75 Pulse Rate: 74 SpO2: 99 %   Anthropometric Measurements Height: 5\' 5"  (1.651 m) Weight: 239 lb (108.4 kg) BMI (Calculated): 39.77 Weight at Last Visit: 240lb Weight Lost Since Last Visit: 1lb Weight Gained Since Last Visit: 0 Starting Weight: 267lb Total Weight Loss (lbs): 28 lb (12.7 kg)   Body Composition  Body Fat %: 46.2 % Fat Mass (lbs): 110.6 lbs Muscle Mass (lbs): 122.4 lbs Total Body Water (lbs): 84.8 lbs Visceral Fat Rating : 14   Other Clinical Data Fasting: Yes Labs: No Today's Visit #: 27 Starting Date: 08/09/19    Chief Complaint:   OBESITY Mackenzie [name] is here to discuss her progress with her obesity treatment plan. She is on the the Category 3 Plan and states she is following her eating plan approximately 85 % of the time. Mackenzie [name] states she is walking for 20-30 minutes 2-3 times per week.   Interim History:  Mackenzie [name] was started on Wellbutrin SR 150mg  at last OV on 08/13/2022 She reports increased flatulence and bloating one week after starting Wellbutrin therapy.   She denies increase in anxiety or insomnia sx's.  Subjective:   1. Pre-diabetes Lab Results  Component Value Date   HGBA1C 5.4 01/29/2022   HGBA1C 5.4 12/19/2021   HGBA1C 5.4 05/23/2021  Mackenzie [name] is unable to continue GLP-1 therapy due to cost. Wegovy $1000, Saxenda $700- out of pocket costs after insurance applied. She has reached her deductible now and we discussed risks/benefits of one month GLP-1 therapy before the new end date of October 04, 2022. Mackenzie [name] has remained on metformin 500 mg twice daily.  2. Emotional Eating Behavior Mackenzie [name] was started on Wellbutrin SR 150mg  at last OV on 08/13/2022 She reports increased flatulence and bloating one week after starting Wellbutrin therapy.  She denies increase in anxiety or insomnia sx's. She denies hx of seizure d/o or nephroliathiasis.  Her BP/HR  are both at goal at OV today.  3. Essential hypertension BP and HR at goal at OV.  4. Vitamin D deficiency  Latest Reference Range & Units 01/29/22 09:28  Vitamin D, 25-Hydroxy 30.0 - 100.0 ng/mL 50.2  She endorses stables energy levels.  5. Healthcare maintenance PCP/Dr. Loralie Champagne manages HLD- Crestor 10mg  QD   Assessment/Plan:   1. Pre-diabetes Check Labs - Insulin, random - Magnesium - Vitamin B12 - Hemoglobin A1c  2. Emotional Eating Behavior Refill - buPROPion (WELLBUTRIN SR) 150 MG 12 hr tablet; Take 1 tablet (150 mg total) by mouth daily.  Dispense: 30 tablet; Refill: 0  3. Essential hypertension Check Labs - Comprehensive metabolic panel  4. Vitamin D deficiency Check Labs - VITAMIN D 25 Hydroxy (Vit-D Deficiency, Fractures)  5. Healthcare maintenance F/u with PCP/Cr. Richter for management of HLD  6. Obesity, Current BMI 39.77  Mackenzie [name] is currently in the action stage of change. As such, her goal is to continue with weight loss efforts. She has agreed to the Category 3 Plan.   Exercise goals: For substantial health benefits, adults should do at least 150 minutes (2 hours and 30 minutes) a week of moderate-intensity, or 75 minutes (1 hour and 15 minutes) a week of vigorous-intensity aerobic physical activity, or an equivalent combination of moderate- and vigorous-intensity aerobic activity. Aerobic activity should be performed in episodes of at least 10 minutes, and preferably, it should be spread throughout the week.  Behavioral modification strategies: increasing lean protein intake, decreasing simple  carbohydrates, increasing vegetables, increasing water intake, no skipping meals, meal planning and cooking strategies, and planning for success.  Mackenzie [name] has agreed to follow-up with our clinic in 3 weeks. She was informed of the importance of frequent follow-up visits to maximize her success with intensive lifestyle modifications for her multiple health  conditions.   Mackenzie [name] was informed we would discuss her lab results at her next visit unless there is a critical issue that needs to be addressed sooner. Mackenzie [name] agreed to keep her next visit at the agreed upon time to discuss these results.  Objective:   Blood pressure 115/75, pulse 74, temperature 98.2 F (36.8 C), height 5\' 5"  (1.651 m), weight 239 lb (108.4 kg), last menstrual period 11/28/2016, SpO2 99 %, unknown if currently breastfeeding. Body mass index is 39.77 kg/m.  General: Cooperative, alert, well developed, in no acute distress. HEENT: Conjunctivae and lids unremarkable. Cardiovascular: Regular rhythm.  Lungs: Normal work of breathing. Neurologic: No focal deficits.   Lab Results  Component Value Date   CREATININE 0.88 01/29/2022   BUN 16 01/29/2022   NA 137 01/29/2022   K 4.5 01/29/2022   CL 101 01/29/2022   CO2 23 01/29/2022   Lab Results  Component Value Date   ALT 15 01/29/2022   AST 16 01/29/2022   GGT 19 03/01/2021   ALKPHOS 177 (H) 01/29/2022   BILITOT 0.3 01/29/2022   Lab Results  Component Value Date   HGBA1C 5.4 01/29/2022   HGBA1C 5.4 12/19/2021   HGBA1C 5.4 05/23/2021   HGBA1C 5.4 02/27/2021   HGBA1C 5.4 11/15/2020   Lab Results  Component Value Date   INSULIN 21.3 01/29/2022   INSULIN 18.2 01/07/2022   INSULIN 15.1 05/23/2021   INSULIN 15.1 02/27/2021   INSULIN 11.7 11/15/2020   Lab Results  Component Value Date   TSH 4.94 07/14/2022   Lab Results  Component Value Date   CHOL 202 (H) 11/15/2020   HDL 58 11/15/2020   LDLCALC 135 (H) 11/15/2020   TRIG 52 11/15/2020   CHOLHDL 3.5 11/15/2020   Lab Results  Component Value Date   VD25OH 50.2 01/29/2022   VD25OH 49.2 12/19/2021   VD25OH 102.0 (H) 05/23/2021   Lab Results  Component Value Date   WBC 3.8 (L) 07/23/2021   HGB 11.5 (L) 07/23/2021   HCT 35.9 (L) 07/23/2021   MCV 79.6 07/23/2021   PLT 201.0 07/23/2021   No results found for: "IRON", "TIBC",  "FERRITIN"  Attestation Statements:   Reviewed by clinician on day of visit: allergies, medications, problem list, medical history, surgical history, family history, social history, and previous encounter notes.  I have reviewed the above documentation for accuracy and completeness, and I agree with the above. -  Mackenzie [name] d. Mackenzie Mendizabal, NP-C

## 2022-09-04 ENCOUNTER — Other Ambulatory Visit (HOSPITAL_COMMUNITY): Payer: Self-pay

## 2022-09-04 LAB — VITAMIN D 25 HYDROXY (VIT D DEFICIENCY, FRACTURES): Vit D, 25-Hydroxy: 81.2 ng/mL (ref 30.0–100.0)

## 2022-09-04 LAB — COMPREHENSIVE METABOLIC PANEL
ALT: 20 IU/L (ref 0–32)
AST: 21 IU/L (ref 0–40)
Albumin/Globulin Ratio: 1.4 (ref 1.2–2.2)
Albumin: 4.4 g/dL (ref 3.8–4.9)
Alkaline Phosphatase: 129 IU/L — ABNORMAL HIGH (ref 44–121)
BUN/Creatinine Ratio: 19 (ref 9–23)
BUN: 17 mg/dL (ref 6–24)
Bilirubin Total: 0.3 mg/dL (ref 0.0–1.2)
CO2: 23 mmol/L (ref 20–29)
Calcium: 9.7 mg/dL (ref 8.7–10.2)
Chloride: 99 mmol/L (ref 96–106)
Creatinine, Ser: 0.9 mg/dL (ref 0.57–1.00)
Globulin, Total: 3.1 g/dL (ref 1.5–4.5)
Glucose: 75 mg/dL (ref 70–99)
Potassium: 4.6 mmol/L (ref 3.5–5.2)
Sodium: 136 mmol/L (ref 134–144)
Total Protein: 7.5 g/dL (ref 6.0–8.5)
eGFR: 76 mL/min/{1.73_m2} (ref >59)

## 2022-09-04 LAB — INSULIN, RANDOM: INSULIN: 13.1 u[IU]/mL (ref 2.6–24.9)

## 2022-09-04 LAB — VITAMIN B12: Vitamin B-12: >2000 pg/mL — ABNORMAL HIGH (ref 232–1245)

## 2022-09-04 LAB — HEMOGLOBIN A1C
Est. average glucose Bld gHb Est-mCnc: 120 mg/dL
Hgb A1c MFr Bld: 5.8 % — ABNORMAL HIGH (ref 4.8–5.6)

## 2022-09-04 LAB — MAGNESIUM: Magnesium: 2 mg/dL (ref 1.6–2.3)

## 2022-09-05 ENCOUNTER — Other Ambulatory Visit (HOSPITAL_COMMUNITY): Payer: Self-pay

## 2022-09-15 ENCOUNTER — Other Ambulatory Visit: Payer: Self-pay

## 2022-09-15 ENCOUNTER — Other Ambulatory Visit (HOSPITAL_COMMUNITY): Payer: Self-pay

## 2022-09-15 ENCOUNTER — Other Ambulatory Visit: Payer: Self-pay | Admitting: Hematology and Oncology

## 2022-09-15 MED ORDER — OMEPRAZOLE 40 MG PO CPDR
40.0000 mg | DELAYED_RELEASE_CAPSULE | Freq: Every day | ORAL | 1 refills | Status: DC
  Filled 2022-09-15: qty 90, 90d supply, fill #0
  Filled 2023-01-01: qty 90, 90d supply, fill #1

## 2022-09-18 ENCOUNTER — Ambulatory Visit (INDEPENDENT_AMBULATORY_CARE_PROVIDER_SITE_OTHER): Payer: 59 | Admitting: Orthopaedic Surgery

## 2022-09-18 ENCOUNTER — Other Ambulatory Visit (INDEPENDENT_AMBULATORY_CARE_PROVIDER_SITE_OTHER): Payer: 59

## 2022-09-18 DIAGNOSIS — M25561 Pain in right knee: Secondary | ICD-10-CM | POA: Diagnosis not present

## 2022-09-18 MED ORDER — METHYLPREDNISOLONE ACETATE 40 MG/ML IJ SUSP
40.0000 mg | INTRAMUSCULAR | Status: AC | PRN
  Administered 2022-09-18: 40 mg via INTRA_ARTICULAR

## 2022-09-18 MED ORDER — BUPIVACAINE HCL 0.5 % IJ SOLN
2.0000 mL | INTRAMUSCULAR | Status: AC | PRN
  Administered 2022-09-18: 2 mL via INTRA_ARTICULAR

## 2022-09-18 MED ORDER — LIDOCAINE HCL 1 % IJ SOLN
2.0000 mL | INTRAMUSCULAR | Status: AC | PRN
  Administered 2022-09-18: 2 mL

## 2022-09-18 NOTE — Progress Notes (Signed)
Office Visit Note   Patient: Mackenzie [name]           Date of Birth: 03/07/1971           MRN: [id number] Visit Date: 09/18/2022              Requested by: Dois Davenport, MD [address] [place],  [place] [address] PCP: Dois Davenport, MD   Assessment & Plan: Visit Diagnoses:  1. Acute pain of right knee     Plan: Impression is 52 year old female with right knee osteoarthritis exacerbation.  Steroid injection administered.  Activity modification as needed.  Follow-Up Instructions: No follow-ups on file.   Orders:  Orders Placed This Encounter  Procedures   XR KNEE 3 VIEW RIGHT   No orders of the defined types were placed in this encounter.     Procedures: Large Joint Inj: R knee on 09/18/2022 9:07 AM Indications: pain Details: 22 G needle  Arthrogram: No  Medications: 40 mg methylPREDNISolone acetate 40 MG/ML; 2 mL lidocaine 1 %; 2 mL bupivacaine 0.5 % Consent was given by the patient. Patient was prepped and draped in the usual sterile fashion.      Clinical Data: No additional findings.   Subjective: Chief Complaint  Patient presents with   Right Knee - Pain    HPI  Mackenzie [name] comes in today for right knee pain.  She is been undergoing steroid and Durolane injections in the last couple years for osteoarthritis.  Recently she did a Zumba class and felt like this exacerbated the knee pain which started about 24 hours later.  She reports crepitus with stairs.  No mechanical symptoms.  Anterior knee pain with activity.  Review of Systems  Constitutional: Negative.   HENT: Negative.    Eyes: Negative.   Respiratory: Negative.    Cardiovascular: Negative.   Endocrine: Negative.   Musculoskeletal: Negative.   Neurological: Negative.   Hematological: Negative.   Psychiatric/Behavioral: Negative.    All other systems reviewed and are negative.    Objective: Vital Signs: LMP 11/28/2016   Physical Exam Vitals and nursing note  reviewed.  Constitutional:      Appearance: She is well-developed.  HENT:     Head: Normocephalic and atraumatic.  Pulmonary:     Effort: Pulmonary effort is normal.  Abdominal:     Palpations: Abdomen is soft.  Musculoskeletal:     Cervical back: Neck supple.  Skin:    General: Skin is warm.     Capillary Refill: Capillary refill takes less than 2 seconds.  Neurological:     Mental Status: She is alert and oriented to person, place, and time.  Psychiatric:        Behavior: Behavior normal.        Thought Content: Thought content normal.        Judgment: Judgment normal.    Ortho Exam  Examination right knee shows no joint effusion.  No joint line tenderness.  Negative McMurray.  Collaterals and cruciates are stable.  Specialty Comments:  No specialty comments available.  Imaging: No results found.   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   Boil of buttock, Left 12/24/2021   Hyperthyroidism 07/22/2021   Low serum thyroid stimulating hormone (TSH) 06/12/2021   Other fatigue 05/23/2021   Hemoglobin low  05/23/2021   Polyphagia 05/16/2020   Vitamin D deficiency 05/16/2020   At risk for osteoporosis 05/16/2020   Prediabetes 05/01/2020   Elevated blood pressure reading 05/01/2020   At risk for impaired metabolic function 05/01/2020   Breast asymmetry following reconstructive surgery 09/20/2019   Essential hypertension 08/17/2018   Class 3 severe obesity with serious comorbidity and body mass index (BMI) of 40.0 to 44.9 in adult 09/17/2017   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   Malignant neoplasm of upper-inner quadrant of left breast in female, 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   Healthcare maintenance 03/25/2017   Malignant 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,  estrogen receptor positive 01/23/2017   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, left 01/23/2017   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   Allergic rhinitis 2012   Anemia 01/27/2017   prior to starting chemotherapy   Breast cancer (HCC) 01/14/2017   Left breast   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, left (HCC) 01/23/2017   Edema, lower extremity    Healthcare maintenance 03/25/2017   Hot flashes 03/2017   Hyperlipidemia 03/20/2016   Morbid obesity with body mass index (BMI) of 40.0 to 49.9 (HCC) 09/17/2017   Pre-diabetes 07/26/2018   Hgb A1C elevated on 07/26/2018, Gestational Diabetes 2012   Seasonal allergies 2012   seasonal allergies causes allergic rhinitis and itchy, dry eyes per pt   Vitamin D deficiency 05/2015    Family History  Problem Relation Age of Onset   Other Mother        d.56 from complications of surgery to remove brain tumor   Diabetes Mother    Hypertension Mother    Stroke Mother    Kidney disease Father    Breast cancer Paternal Grandmother [id number]       d.60s from breast cancer. Did not have treatment.   Hypertension Other    Diabetes Other    Stroke Other     Past Surgical History:  Procedure Laterality Date   BREAST RECONSTRUCTION WITH PLACEMENT OF TISSUE EXPANDER AND FLEX HD (ACELLULAR HYDRATED DERMIS) Left 08/27/2017   Procedure: LEFT BREAST RECONSTRUCTION WITH PLACEMENT OF TISSUE EXPANDER AND FLEX HD;  Surgeon: Peggye Formillingham, Claire S, DO;  Location: [facility];  Service: Plastics;  Laterality: Left;   CESAREAN SECTION     x2   LATISSIMUS FLAP TO BREAST Left 11/03/2018   LATISSIMUS FLAP TO BREAST Left 11/03/2018   Procedure: LATISSIMUS FLAP TO LEFT BREAST;  Surgeon: Peggye Formillingham, Claire S, DO;  Location: [facility];  Service: Plastics;  Laterality: Left;   MASTECTOMY MODIFIED RADICAL Left 08/27/2017   MASTECTOMY MODIFIED RADICAL Left 08/27/2017   Procedure: LEFT  MODIFIED RADICAL MASTECTOMY;  Surgeon: Harriette Bouillonornett, Thomas, MD;  Location: [facility];  Service: General;  Laterality: Left;   MASTOPEXY Right 03/03/2019   Procedure: RIGHT BREAST MASTOPEXY/REDUCTION;  Surgeon: Peggye Formillingham, Claire S, DO;  Location: [facility];  Service: Plastics;  Laterality: Right;  3 hours, please   PORT-A-CATH REMOVAL Right 08/27/2017   Procedure: REMOVAL PORT-A-CATH RIGHT CHEST;  Surgeon: Harriette Bouillonornett, Thomas, MD;  Location: [facility];  Service: General;  Laterality: Right;   PORTACATH PLACEMENT Right 01/28/2017   Procedure: INSERTION PORT-A-CATH WITH ULTRASOUND;  Surgeon: Harriette Bouillonornett, Thomas, MD;  Location: [facility];  Service: General;  Laterality: Right;   REDUCTION MAMMAPLASTY Right    REMOVAL OF TISSUE EXPANDER AND PLACEMENT OF IMPLANT  Left 03/03/2019   Procedure: REMOVAL OF TISSUE EXPANDER AND PLACEMENT OF EXPANDER;  Surgeon: Peggye Form, DO;  Location: [facility];  Service: Plastics;  Laterality: Left;   REMOVAL OF TISSUE EXPANDER AND PLACEMENT OF IMPLANT Left 05/25/2019   Procedure: LEFT BREAST REMOVAL OF TISSUE EXPANDER AND PLACEMENT OF IMPLANT;  Surgeon: Peggye Form, DO;  Location: [facility];  Service: Plastics;  Laterality: Left;   TISSUE EXPANDER PLACEMENT Left 11/03/2018   Procedure: PLACEMENT OF TISSUE EXPANDER LEFT BREAST;  Surgeon: Peggye Form, DO;  Location: [facility];  Service: Plastics;  Laterality: Left;  Total case time is 3.5 hours   TUBAL LIGATION Bilateral 01/21/2011   Social History   Occupational History   Occupation: Charity fundraiser  Tobacco Use   Smoking status: Never   Smokeless tobacco: Never  Vaping Use   Vaping Use: Never used  Substance and Sexual Activity   Alcohol use: Yes    Comment: social   Drug use: No   Sexual activity: Not on file    Comment: BTL

## 2022-09-22 ENCOUNTER — Emergency Department (HOSPITAL_BASED_OUTPATIENT_CLINIC_OR_DEPARTMENT_OTHER): Payer: 59

## 2022-09-22 ENCOUNTER — Emergency Department (HOSPITAL_BASED_OUTPATIENT_CLINIC_OR_DEPARTMENT_OTHER)
Admission: EM | Admit: 2022-09-22 | Discharge: 2022-09-22 | Disposition: A | Discharge status: Home / Self Care | Payer: 59 | Attending: Emergency Medicine | Admitting: Emergency Medicine

## 2022-09-22 ENCOUNTER — Encounter (HOSPITAL_BASED_OUTPATIENT_CLINIC_OR_DEPARTMENT_OTHER): Payer: Self-pay | Admitting: Emergency Medicine

## 2022-09-22 ENCOUNTER — Other Ambulatory Visit: Payer: Self-pay

## 2022-09-22 DIAGNOSIS — M79604 Pain in right leg: Secondary | ICD-10-CM | POA: Diagnosis not present

## 2022-09-22 DIAGNOSIS — Z853 Personal history of malignant neoplasm of breast: Secondary | ICD-10-CM | POA: Insufficient documentation

## 2022-09-22 DIAGNOSIS — M79661 Pain in right lower leg: Secondary | ICD-10-CM | POA: Diagnosis not present

## 2022-09-22 DIAGNOSIS — M25571 Pain in right ankle and joints of right foot: Secondary | ICD-10-CM | POA: Insufficient documentation

## 2022-09-22 DIAGNOSIS — M25561 Pain in right knee: Secondary | ICD-10-CM | POA: Diagnosis not present

## 2022-09-22 DIAGNOSIS — Z794 Long term (current) use of insulin: Secondary | ICD-10-CM | POA: Diagnosis not present

## 2022-09-22 NOTE — Discharge Instructions (Addendum)
You were seen emergency department today for knee and leg pain.  As we discussed your ultrasound did not show any evidence of a DVT.  We did not find an emergent etiology for your symptoms today.  I suspect there could be a component of arthritis, specially after reviewing the x-ray that you had orthopedics the other day.  I strongly recommend following up with Ortho, as they can do any further imaging.  I recommend continuing over-the-counter medications such as ibuprofen, Tylenol, and/or Aleve.  You can use ice as needed for swelling, and I recommend elevating the extremity when possible.

## 2022-09-22 NOTE — ED Triage Notes (Signed)
Pt arrives to ED with c/o right knee and right lower leg pain x3 weeks after zumba. Saw ortho who gave a steroid injection.

## 2022-09-22 NOTE — ED Provider Notes (Signed)
[place] EMERGENCY DEPARTMENT AT [facility] Provider Note   CSN: [id number] Arrival date & time: 09/22/22  1830     History  Chief Complaint  Patient presents with   Knee Pain   Leg Pain    Mackenzie [name] is a 52 y.o. female with history of breast cancer, hyperlipidemia, prediabetes who presents to the emergency department complaining of right leg pain.  Patient localizes pain to the right knee and right lower leg.  States it has been going on for about 3 weeks or so after doing a Zumba class.  She recently saw the orthopedist who gave her a steroid injection.  She reports there is a mild improvement at that time, but pain came back.  It is worse with full bending of the knee, and flexing the ankle such as pressing down on the gas pedal.  She has been taking some Tylenol.  No significant calf swelling.  She states that the orthopedist recommended she follow-up if her symptoms persist, but she did not feel she could wait for follow-up.   Knee Pain Leg Pain      Home Medications Prior to Admission medications   Medication Sig Start Date End Date Taking? Authorizing Provider  acetaminophen (TYLENOL) 500 MG tablet Take 1,000 mg by mouth every 6 (six) hours as needed (for pain/headaches.).     [provider]  Ascorbic Acid (VITAMIN C PO) Take by mouth daily. 06/07/21   [provider]  Blood Pressure Monitoring (OMRON 3 SERIES BP MONITOR) DEVI Use as directed 12/18/21     buPROPion (WELLBUTRIN SR) 150 MG 12 hr tablet Take 1 tablet (150 mg total) by mouth daily. 09/03/22   Danford, Orpha Bur D, NP  calcium carbonate (TUMS - DOSED IN MG ELEMENTAL CALCIUM) 500 MG chewable tablet Chew 1 tablet by mouth 2 (two) times daily. 07/09/20   [provider]  celecoxib (CELEBREX) 200 MG capsule Take 1 capsule (200 mg total) by mouth 2 (two) times daily. Patient taking differently: Take 200 mg by mouth daily. 04/05/21   Tarry Kos, MD  cetirizine (ZYRTEC) 10 MG  tablet Take 10 mg by mouth daily.    [provider]  cyclobenzaprine (FLEXERIL) 10 MG tablet Take 0.5 tablets (5 mg total) by mouth 2 (two) times daily as needed. 02/20/22     gabapentin (NEURONTIN) 300 MG capsule Take 1 capsule (300 mg total) by mouth 2 (two) times daily. 06/16/22     Insulin Pen Needle 32G X 4 MM MISC Use daily. 06/24/22   Danford, Orpha Bur D, NP  letrozole (FEMARA) 2.5 MG tablet Take 1 tablet (2.5 mg total) by mouth daily. 05/08/22   Rachel Moulds, MD  loratadine (CLARITIN) 10 MG tablet Take 1 tablet (10 mg total) by mouth daily. Patient taking differently: Take 10 mg by mouth daily as needed for allergies. 02/11/17   Magrinat, Valentino Hue, MD  losartan (COZAAR) 50 MG tablet Take 1 tablet (50 mg total) by mouth daily. 06/16/22     metFORMIN (GLUCOPHAGE) 500 MG tablet Take 1 tablet (500 mg total) by mouth 2 (two) times daily with a meal. 06/12/22   Danford, Orpha Bur D, NP  methimazole (TAPAZOLE) 5 MG tablet Take 1 tablet daily Monday through Friday and 2 tablets on Saturday and Sundays as directed 07/14/22   Shamleffer, Konrad Dolores, MD  metoprolol succinate (TOPROL-XL) 50 MG 24 hr tablet Take 1 tablet (50 mg total) by mouth daily. Patient taking differently: Take 25 mg by mouth daily.  04/24/22     Minoxidil (ROGAINE MENS) 5 % FOAM apply twice daily as directed 02/20/22     montelukast (SINGULAIR) 10 MG tablet Take 1 tablet (10 mg total) by mouth at bedtime. 08/04/22     Multiple Vitamin (MULTIVITAMIN WITH MINERALS) TABS tablet Take 1 tablet by mouth daily.     [provider]  Omega-3 Fatty Acids (FISH OIL) 1000 MG CAPS Take 1,000 mg by mouth 2 (two) times daily.     [provider]  omeprazole (PRILOSEC) 40 MG capsule Take 1 capsule (40 mg total) by mouth daily. 09/15/22   Rachel Moulds, MD  ondansetron (ZOFRAN) 4 MG tablet Take 1 tablet (4 mg total) by mouth every 8 (eight) hours as needed for nausea or vomiting. 07/10/22   Danford, Orpha Bur D, NP  Probiotic Product  (PROBIOTIC DAILY PO) Take 1 capsule by mouth 2 (two) times daily.     [provider]  rosuvastatin (CRESTOR) 10 MG tablet Take 1 tablet (10 mg total) by mouth daily. 08/04/22     spironolactone (ALDACTONE) 25 MG tablet Take 1 tablet (25 mg total) by mouth daily. 08/04/22     ZINC OXIDE PO Take 1 tablet by mouth 2 (two) times a day.    [provider]      Allergies    Dilaudid [hydromorphone]    Review of Systems   Review of Systems  Musculoskeletal:  Positive for arthralgias and myalgias.  All other systems reviewed and are negative.   Physical Exam Updated Vital Signs BP (!) 142/74 (BP Location: Right Arm)   Pulse 78   Temp 98.4 F (36.9 C) (Oral)   Resp 16   Ht  (1.651 m)   Wt 108.4 kg   LMP 11/28/2016   SpO2 96%   BMI 39.77 kg/m  Physical Exam Vitals and nursing note reviewed.  Constitutional:      Appearance: Normal appearance.  HENT:     Head: Normocephalic and atraumatic.  Eyes:     Conjunctiva/sclera: Conjunctivae normal.  Cardiovascular:     Pulses:          Posterior tibial pulses are 2+ on the right side and 2+ on the left side.  Pulmonary:     Effort: Pulmonary effort is normal. No respiratory distress.  Musculoskeletal:     Right lower leg: No edema.     Left lower leg: No edema.     Comments: Pain with full flexion of the knee, but normal ROM.  Pain with plantar flexing the right ankle.  Can range all digits.  Normal sensation.  Skin:    General: Skin is warm and dry.  Neurological:     Mental Status: She is alert.  Psychiatric:        Mood and Affect: Mood normal.        Behavior: Behavior normal.     ED Results / Procedures / Treatments   Labs (all labs ordered are listed, but only abnormal results are displayed) Labs Reviewed - No data to display  EKG None  Radiology US Venous Img Lower Right (DVT Study)  Result Date: 09/22/2022 CLINICAL DATA:  Right knee and leg pain for 3 weeks after Zumba class EXAM: Right  LOWER EXTREMITY VENOUS DOPPLER ULTRASOUND TECHNIQUE: Gray-scale sonography with compression, as well as color and duplex ultrasound, were performed to evaluate the deep venous system(s) from the level of the common femoral vein through the popliteal and proximal calf veins. COMPARISON:  None Available. FINDINGS:  VENOUS Normal compressibility of the common femoral, superficial femoral, and popliteal veins, as well as the visualized calf veins. Visualized portions of profunda femoral vein and great saphenous vein unremarkable. No filling defects to suggest DVT on grayscale or color Doppler imaging. Doppler waveforms show normal direction of venous flow, normal respiratory plasticity and response to augmentation. Limited views of the contralateral common femoral vein are unremarkable. OTHER None. Limitations: none IMPRESSION: Negative. Electronically Signed   By: Minerva Fester M.D.   On: 09/22/2022 19:45    Procedures Procedures    Medications Ordered in ED Medications - No data to display  ED Course/ Medical Decision Making/ A&P                             Medical Decision Making  This patient is a 52 y.o. female  who presents to the ED for concern of right leg pain.   Differential diagnoses prior to evaluation: The emergent differential diagnosis includes, but is not limited to, fracture, dislocation, ligamentous injury, arthritis, septic joint, DVT. This is not an exhaustive differential.   Past Medical History / Co-morbidities: breast cancer, hyperlipidemia, prediabetes   Additional history: Chart reviewed. Pertinent results include: Reviewed orthopedic note from 4/11 where patient was given methylprednisolone injection.  Reviewed x-ray performed on the day with no acute abnormalities.  Physical Exam: Physical exam performed. The pertinent findings include: Pain with full flexion of the right knee, and plantarflexion of the right ankle.  No appreciable calf swelling.  Strong distal  pulses, and sensation intact.  No swelling of the knee, increased warmth or erythema.  Exam not consistent with septic joint.  Lab Tests/Imaging studies: I personally interpreted labs/imaging and the pertinent results include: Ultrasound of the right lower leg shows no evidence of DVT.   Disposition: After consideration of the diagnostic results and the patients response to treatment, I feel that emergency department workup does not suggest an emergent condition requiring admission or immediate intervention beyond what has been performed at this time. The plan is: Discharge home with recommendation for orthopedic follow-up for ongoing right knee/leg pain.  Will provide a knee brace today for comfort.  Recommend continued over-the-counter medications.  No emergent etiology found for patient's symptoms today.. The patient is safe for discharge and has been instructed to return immediately for worsening symptoms, change in symptoms or any other concerns.  Final Clinical Impression(s) / ED Diagnoses Final diagnoses:  Right leg pain    Rx / DC Orders ED Discharge Orders     None      Portions of this report may have been transcribed using voice recognition software. Every effort was made to ensure accuracy; however, inadvertent computerized transcription errors may be present.    Tumeka Chimenti T, PA-C 09/22/22 2042    Sloan Leiter, DO 09/25/22 1535

## 2022-09-23 ENCOUNTER — Ambulatory Visit (INDEPENDENT_AMBULATORY_CARE_PROVIDER_SITE_OTHER): Payer: 59 | Admitting: Orthopaedic Surgery

## 2022-09-23 ENCOUNTER — Other Ambulatory Visit (HOSPITAL_COMMUNITY): Payer: Self-pay

## 2022-09-23 DIAGNOSIS — M1711 Unilateral primary osteoarthritis, right knee: Secondary | ICD-10-CM | POA: Diagnosis not present

## 2022-09-23 MED ORDER — HYDROCODONE-ACETAMINOPHEN 5-325 MG PO TABS
1.0000 | ORAL_TABLET | Freq: Three times a day (TID) | ORAL | 0 refills | Status: DC | PRN
  Filled 2022-09-23: qty 20, 7d supply, fill #0

## 2022-09-23 NOTE — Progress Notes (Unsigned)
Office Visit Note   Patient: Mackenzie [name]           MRN: [id number] Visit Date: 09/23/2022              Requested by: Dois Davenport, MD [address] [place],  [place] [address] PCP: Dois Davenport, MD   Assessment & Plan: Visit Diagnoses:  1. Primary osteoarthritis of right knee     Plan: Impression is acute on chronic right knee pain following Zumba class III weeks ago concerning for fracture versus possible meniscal pathology.  Patient is clinically in a fair amount of pain and is having trouble bearing weight.  Will order an urgent MRI to rule out fracture.  Follow-up with [name] once this is completed.  Will provide her with an out of work note today.  Have also sent in medicine for pain.  Call with concerns or questions.  Pain  Follow-Up Instructions: Return for f/u after MRI.   Orders:  No orders of the defined types were placed in this encounter.  Meds ordered this encounter  Medications   HYDROcodone-acetaminophen (NORCO) 5-325 MG tablet    Sig: Take 1 tablet by mouth 3 (three) times daily as needed.    Dispense:  20 tablet    Refill:  0      Procedures: No procedures performed   Clinical Data: No additional findings.   Subjective: No chief complaint on file.   HPI patient is a pleasant 52 year old female who comes in today with worsening right knee pain.  History of osteoarthritis to the right knee where she has undergone multiple cortisone and viscosupplementation injections.  She flared this up about 3 weeks ago following a Zumba class.  She was seen in our office last week where cortisone injection was performed.  She was doing okay until Saturday when she sat for a long period of time during her daughter's dance class.  Her pain significantly worsened last night and she was seen in the ED.  DVT was ruled out to the right lower extremity.  Lab work was not performed.  She denies any injury following the  injection last week.  She denies any fevers or chills or any other systemic symptoms.  The pain she is having is to the entire knee radiating down the lower leg.  Symptoms are worse with bearing weight.  She has been taking ibuprofen and Tylenol without significant relief.  Review of Systems as detailed in HPI.  All others reviewed and are negative.   Objective: Vital Signs: LMP 11/28/2016   Physical Exam well-developed well-nourished female no acute distress.  Alert and oriented x 3.  Ortho Exam right knee exam shows a small effusion.  Range of motion 0 to 100 degrees without significant pain.  Slight medial and lateral joint line tenderness.  No erythema and no warmth.  She is neurovascularly intact distally.  Specialty Comments:  No specialty comments available.  Imaging: US Venous Img Lower Right (DVT Study)  Result Date: 09/22/2022 CLINICAL DATA:  Right knee and leg pain for 3 weeks after Zumba class EXAM: Right LOWER EXTREMITY VENOUS DOPPLER ULTRASOUND TECHNIQUE: Gray-scale sonography with compression, as well as color and duplex ultrasound, were performed to evaluate the deep venous system(s) from the level of the common femoral vein through the popliteal and proximal calf veins. COMPARISON:  None Available. FINDINGS: VENOUS Normal compressibility of the common  femoral, superficial femoral, and popliteal veins, as well as the visualized calf veins. Visualized portions of profunda femoral vein and great saphenous vein unremarkable. No filling defects to suggest DVT on grayscale or color Doppler imaging. Doppler waveforms show normal direction of venous flow, normal respiratory plasticity and response to augmentation. Limited views of the contralateral common femoral vein are unremarkable. OTHER None. Limitations: none IMPRESSION: Negative. Electronically Signed   By: Minerva Fester M.D.   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   Boil of buttock, Left 12/24/2021   Hyperthyroidism 07/22/2021   Low serum thyroid stimulating hormone (TSH) 06/12/2021   Other fatigue 05/23/2021   Hemoglobin low 05/23/2021   Polyphagia 05/16/2020   Vitamin D deficiency 05/16/2020   At risk for osteoporosis 05/16/2020   Prediabetes 05/01/2020   Elevated blood pressure reading 05/01/2020   At risk for impaired metabolic function 05/01/2020   Breast asymmetry following reconstructive surgery 09/20/2019   Essential hypertension 08/17/2018   Class 3 severe obesity with serious comorbidity and body mass index (BMI) of 40.0 to 44.9 in adult 09/17/2017   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   Malignant neoplasm of upper-inner quadrant of left breast in female, 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   Healthcare maintenance 03/25/2017   Malignant 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, estrogen receptor positive 01/23/2017   Carcinoma of breast 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, left 01/23/2017   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   Allergic rhinitis 2012   Anemia 01/27/2017   prior to starting chemotherapy   Breast cancer 01/14/2017   Left breast   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, left 01/23/2017   Edema, lower extremity    Healthcare maintenance 03/25/2017   Hot flashes 03/2017   Hyperlipidemia 03/20/2016   Morbid obesity with body mass index (BMI) of 40.0 to 49.9 09/17/2017   Pre-diabetes 07/26/2018   Hgb A1C elevated on 07/26/2018, Gestational Diabetes 2012   Seasonal allergies 2012   seasonal allergies causes allergic rhinitis and itchy, dry eyes per pt   Vitamin D deficiency 05/2015    Family History  Problem Relation Age of Onset   Other Mother        d.56 from  complications of surgery to remove brain tumor   Diabetes Mother    Hypertension Mother    Stroke Mother    Kidney disease Father    Breast cancer Paternal Grandmother 77       d.60s from breast cancer. Did not have treatment.   Hypertension Other    Diabetes Other    Stroke Other     Past Surgical History:  Procedure Laterality Date   BREAST RECONSTRUCTION WITH PLACEMENT OF TISSUE EXPANDER AND FLEX HD (ACELLULAR HYDRATED DERMIS) Left 08/27/2017   Procedure: LEFT BREAST RECONSTRUCTION WITH PLACEMENT OF TISSUE EXPANDER AND FLEX HD;  Surgeon: Peggye Form, DO;  Location: [facility];  Service: Plastics;  Laterality: Left;   CESAREAN SECTION     x2   LATISSIMUS FLAP TO BREAST Left 11/03/2018   LATISSIMUS FLAP TO BREAST Left 11/03/2018   Procedure: LATISSIMUS FLAP TO LEFT BREAST;  Surgeon: Peggye Form, DO;  Location: [facility];  Service: Plastics;  Laterality: Left;   MASTECTOMY MODIFIED RADICAL Left 08/27/2017   MASTECTOMY MODIFIED RADICAL Left 08/27/2017   Procedure: LEFT MODIFIED RADICAL MASTECTOMY;  Surgeon: Harriette Bouillon, MD;  Location: [facility];  Service: General;  Laterality: Left;   MASTOPEXY Right 03/03/2019   Procedure: RIGHT BREAST MASTOPEXY/REDUCTION;  Surgeon: Peggye Form, DO;  Location: [facility];  Service: Plastics;  Laterality: Right;  3 hours, please   PORT-A-CATH REMOVAL Right 08/27/2017   Procedure: REMOVAL PORT-A-CATH RIGHT CHEST;  Surgeon: Harriette Bouillon, MD;  Location: [facility];  Service: General;  Laterality: Right;   PORTACATH PLACEMENT Right 01/28/2017   Procedure: INSERTION PORT-A-CATH WITH ULTRASOUND;  Surgeon: Harriette Bouillon, MD;  Location: [facility];  Service: General;  Laterality: Right;   REDUCTION MAMMAPLASTY Right    REMOVAL OF TISSUE EXPANDER AND PLACEMENT OF IMPLANT Left 03/03/2019   Procedure: REMOVAL OF TISSUE EXPANDER AND PLACEMENT OF EXPANDER;  Surgeon: Peggye Form, DO;  Location: [facility];  Service: Plastics;  Laterality: Left;   REMOVAL OF TISSUE EXPANDER AND PLACEMENT OF IMPLANT Left 05/25/2019   Procedure: LEFT BREAST REMOVAL OF TISSUE EXPANDER AND PLACEMENT OF IMPLANT;  Surgeon: Peggye Form, DO;  Location:  SURGERY CENTER;  Service: Plastics;  Laterality: Left;   TISSUE EXPANDER PLACEMENT Left 11/03/2018   Procedure: PLACEMENT OF TISSUE EXPANDER LEFT BREAST;  Surgeon: Peggye Form, DO;  [facility]: [facility];  Service: Plastics;  Laterality: Left;  Total case time is 3.5 hours   TUBAL LIGATION Bilateral 01/21/2011   Social History   Occupational History   Occupation: Charity fundraiser  Tobacco Use   Smoking status: Never   Smokeless tobacco: Never  Vaping Use   Vaping Use: Never used  Substance and Sexual Activity   Alcohol use: Yes    Comment: social   Drug use: No   Sexual activity: Not on file    Comment: BTL

## 2022-09-24 ENCOUNTER — Ambulatory Visit
Admission: RE | Admit: 2022-09-24 | Discharge: 2022-09-24 | Disposition: A | Discharge status: Home / Self Care | Payer: 59 | Source: Ambulatory Visit | Attending: Physician Assistant | Admitting: Physician Assistant

## 2022-09-24 ENCOUNTER — Telehealth: Payer: Self-pay

## 2022-09-24 DIAGNOSIS — M1711 Unilateral primary osteoarthritis, right knee: Secondary | ICD-10-CM

## 2022-09-24 DIAGNOSIS — S83241A Other tear of medial meniscus, current injury, right knee, initial encounter: Secondary | ICD-10-CM | POA: Diagnosis not present

## 2022-09-24 NOTE — Telephone Encounter (Signed)
Patient called stating that she is having pain in her right leg and wanted to know about an MRI of her leg.   Talked with Mardella Layman, Dr. Warren Danes PA and was advised that no MRI is needed for her right leg, only needed for right knee.    Advised patient of message above and advised her that she will get a call back concerning her MRI results once we have the results.

## 2022-09-25 NOTE — Progress Notes (Signed)
F/u to discuss

## 2022-09-26 ENCOUNTER — Ambulatory Visit (INDEPENDENT_AMBULATORY_CARE_PROVIDER_SITE_OTHER): Payer: 59 | Admitting: Orthopaedic Surgery

## 2022-09-26 DIAGNOSIS — S83241A Other tear of medial meniscus, current injury, right knee, initial encounter: Secondary | ICD-10-CM

## 2022-09-26 NOTE — Progress Notes (Signed)
Office Visit Note   Patient: Mackenzie [name]           Date of Birth: April 12, 1971           MRN: [id number] Visit Date: 09/26/2022              Requested by: Dois Davenport, MD [address] [place],  [place] [address] PCP: Dois Davenport, MD   Assessment & Plan: Visit Diagnoses:  1. Acute medial meniscus tear of right knee, initial encounter     Plan: Impression 52 year old female with large medial meniscal tear in the right knee.  Negative for fracture.  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  Based on these findings I recommended arthroscopic knee surgery with partial medial meniscectomy and chondroplasty as indicated.  Risk benefits prognosis reviewed with the patient.  Due to recent steroid injection on the 11th we will need to postpone surgery for at least 4 weeks to mitigate risk of infection.  Mackenzie [name] will call the patient to schedule surgery in the near future.  Follow-Up Instructions: No follow-ups on file.   Orders:  No orders of the defined types were placed in this encounter.  No orders of the defined types were placed in this encounter.     Procedures: No procedures performed   Clinical Data: No additional findings.   Subjective: Chief Complaint  Patient presents with   Right Knee - Follow-up    HPI  Mackenzie [name] returns today to discuss right knee MRI.  Her pain is only slightly better.  Continues to use a rolling walker for ambulation.  Continues to have immediate pain with weightbearing.  Review of Systems   Objective: Vital Signs: LMP 11/28/2016   Physical Exam  Ortho Exam  Examination right knee shows medial joint line tenderness.  Pain with McMurray's maneuver.  Trace effusion.  Specialty Comments:  No specialty comments available.  Imaging: No results found.   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   Boil of buttock, Left 12/24/2021   Hyperthyroidism 07/22/2021   Low serum thyroid stimulating hormone (TSH) 06/12/2021   Other fatigue 05/23/2021   Hemoglobin low 05/23/2021   Polyphagia 05/16/2020   Vitamin D deficiency 05/16/2020   At risk for osteoporosis 05/16/2020   Prediabetes 05/01/2020   Elevated blood pressure reading 05/01/2020   At risk for impaired metabolic function 05/01/2020   Breast asymmetry following reconstructive surgery 09/20/2019   Essential hypertension 08/17/2018   Class 3 severe obesity with serious comorbidity and body mass index (BMI) of 40.0 to 44.9 in adult 09/17/2017   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   Malignant neoplasm of upper-inner quadrant of left breast in female, 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   Healthcare maintenance 03/25/2017   Malignant 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, estrogen receptor positive 01/23/2017   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, left 01/23/2017   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   Allergic rhinitis 2012   Anemia 01/27/2017   prior to starting chemotherapy   Breast cancer 01/14/2017   Left breast   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, left 01/23/2017   Edema, lower extremity    Healthcare maintenance 03/25/2017  Hot flashes 03/2017   Hyperlipidemia 03/20/2016   Morbid obesity with body mass index (BMI) of 40.0 to 49.9 09/17/2017   Pre-diabetes 07/26/2018   Hgb A1C elevated on 07/26/2018, Gestational Diabetes 2012   Seasonal allergies 2012   seasonal allergies causes allergic rhinitis and itchy, dry eyes per pt   Vitamin D deficiency 05/2015    Family History  Problem Relation Age of Onset   Other Mother        d.56 from complications of surgery to remove brain tumor    Diabetes Mother    Hypertension Mother    Stroke Mother    Kidney disease Father    Breast cancer Paternal Grandmother 64       d.60s from breast cancer. Did not have treatment.   Hypertension Other    Diabetes Other    Stroke Other     Past Surgical History:  Procedure Laterality Date   BREAST RECONSTRUCTION WITH PLACEMENT OF TISSUE EXPANDER AND FLEX HD (ACELLULAR HYDRATED DERMIS) Left 08/27/2017   Procedure: LEFT BREAST RECONSTRUCTION WITH PLACEMENT OF TISSUE EXPANDER AND FLEX HD;  Surgeon: Peggye Form, DO;  [facility]: [facility];  Service: Plastics;  Laterality: Left;   CESAREAN SECTION     x2   LATISSIMUS FLAP TO BREAST Left 11/03/2018   LATISSIMUS FLAP TO BREAST Left 11/03/2018   Procedure: LATISSIMUS FLAP TO LEFT BREAST;  Surgeon: Peggye Form, DO;  Location: [facility];  Service: Plastics;  Laterality: Left;   MASTECTOMY MODIFIED RADICAL Left 08/27/2017   MASTECTOMY MODIFIED RADICAL Left 08/27/2017   Procedure: LEFT MODIFIED RADICAL MASTECTOMY;  Surgeon: Harriette Bouillon, MD;  Location: [facility];  Service: General;  Laterality: Left;   MASTOPEXY Right 03/03/2019   Procedure: RIGHT BREAST MASTOPEXY/REDUCTION;  Surgeon: Peggye Form, DO;  Location: [facility];  Service: Plastics;  Laterality: Right;  3 hours, please   PORT-A-CATH REMOVAL Right 08/27/2017   Procedure: REMOVAL PORT-A-CATH RIGHT CHEST;  Surgeon: Harriette Bouillon, MD;  [facility];  Service: General;  Laterality: Right;   PORTACATH PLACEMENT Right 01/28/2017   Procedure: INSERTION PORT-A-CATH WITH ULTRASOUND;  Surgeon: Harriette Bouillon, MD;  Location: [facility];  Service: General;  Laterality: Right;   REDUCTION MAMMAPLASTY Right    REMOVAL OF TISSUE EXPANDER AND PLACEMENT OF IMPLANT Left 03/03/2019   Procedure: REMOVAL OF TISSUE EXPANDER AND PLACEMENT OF EXPANDER;  Surgeon: Peggye Form, DO;  Location: [facility];  Service: Plastics;  Laterality: Left;    REMOVAL OF TISSUE EXPANDER AND PLACEMENT OF IMPLANT Left 05/25/2019   Procedure: LEFT BREAST REMOVAL OF TISSUE EXPANDER AND PLACEMENT OF IMPLANT;  Surgeon: Peggye Form, DO;  Location: [facility];  Service: Plastics;  Laterality: Left;   TISSUE EXPANDER PLACEMENT Left 11/03/2018   Procedure: PLACEMENT OF TISSUE EXPANDER LEFT BREAST;  Surgeon: Peggye Form, DO;  [facility]: [facility];  Service: Plastics;  Laterality: Left;  Total case time is 3.5 hours   TUBAL LIGATION Bilateral 01/21/2011   Social History   Occupational History   Occupation: Charity fundraiser  Tobacco Use   Smoking status: Never   Smokeless tobacco: Never  Vaping Use   Vaping Use: Never used  Substance and Sexual Activity   Alcohol use: Yes    Comment: social   Drug use: No   Sexual activity: Not on file    Comment: BTL

## 2022-09-29 ENCOUNTER — Other Ambulatory Visit (HOSPITAL_COMMUNITY): Payer: Self-pay

## 2022-09-29 ENCOUNTER — Other Ambulatory Visit: Payer: Self-pay | Admitting: Physician Assistant

## 2022-09-30 ENCOUNTER — Other Ambulatory Visit (HOSPITAL_COMMUNITY): Payer: Self-pay

## 2022-09-30 ENCOUNTER — Ambulatory Visit (INDEPENDENT_AMBULATORY_CARE_PROVIDER_SITE_OTHER): Payer: 59 | Admitting: Adult Health

## 2022-09-30 ENCOUNTER — Encounter (INDEPENDENT_AMBULATORY_CARE_PROVIDER_SITE_OTHER): Payer: Self-pay | Admitting: Adult Health

## 2022-09-30 VITALS — BP 114/69 | HR 68 | Temp 98.0°F | Ht 65.0 in | Wt 241.0 lb

## 2022-09-30 DIAGNOSIS — I10 Essential (primary) hypertension: Secondary | ICD-10-CM | POA: Diagnosis not present

## 2022-09-30 DIAGNOSIS — F3289 Other specified depressive episodes: Secondary | ICD-10-CM | POA: Diagnosis not present

## 2022-09-30 DIAGNOSIS — R7303 Prediabetes: Secondary | ICD-10-CM

## 2022-09-30 DIAGNOSIS — E559 Vitamin D deficiency, unspecified: Secondary | ICD-10-CM

## 2022-09-30 DIAGNOSIS — M25561 Pain in right knee: Secondary | ICD-10-CM

## 2022-09-30 DIAGNOSIS — Z6841 Body Mass Index (BMI) 40.0 and over, adult: Secondary | ICD-10-CM

## 2022-09-30 DIAGNOSIS — E669 Obesity, unspecified: Secondary | ICD-10-CM

## 2022-09-30 MED ORDER — CYCLOBENZAPRINE HCL 10 MG PO TABS
5.0000 mg | ORAL_TABLET | Freq: Two times a day (BID) | ORAL | 0 refills | Status: DC | PRN
  Filled 2022-09-30: qty 30, 30d supply, fill #0

## 2022-09-30 MED ORDER — METFORMIN HCL 500 MG PO TABS
500.0000 mg | ORAL_TABLET | Freq: Two times a day (BID) | ORAL | 0 refills | Status: DC
  Filled 2022-09-30: qty 180, 90d supply, fill #0

## 2022-09-30 MED ORDER — SPIRONOLACTONE 25 MG PO TABS
25.0000 mg | ORAL_TABLET | Freq: Every day | ORAL | 0 refills | Status: DC
  Filled 2022-09-30 – 2022-10-31 (×2): qty 90, 90d supply, fill #0

## 2022-09-30 MED ORDER — LOSARTAN POTASSIUM 50 MG PO TABS
50.0000 mg | ORAL_TABLET | Freq: Every day | ORAL | 0 refills | Status: DC
  Filled 2022-09-30: qty 90, 90d supply, fill #0

## 2022-09-30 MED ORDER — BUPROPION HCL ER (SR) 150 MG PO TB12
150.0000 mg | ORAL_TABLET | Freq: Every day | ORAL | 0 refills | Status: DC
  Filled 2022-09-30 – 2022-10-15 (×2): qty 90, 90d supply, fill #0

## 2022-09-30 MED ORDER — GABAPENTIN 300 MG PO CAPS
300.0000 mg | ORAL_CAPSULE | Freq: Two times a day (BID) | ORAL | 0 refills | Status: DC
  Filled 2022-09-30: qty 180, 90d supply, fill #0

## 2022-09-30 NOTE — Progress Notes (Signed)
WEIGHT SUMMARY AND BIOMETRICS  Vitals Temp: 98 F (36.7 C) BP: 114/69 Pulse Rate: 68 SpO2: 100 %   Anthropometric Measurements Height:  (1.651 m) Weight: 241 lb (109.3 kg) BMI (Calculated): 40.1 Weight at Last Visit: 239lb Weight Lost Since Last Visit: 0 Weight Gained Since Last Visit: 2lb Starting Weight: 267lb Total Weight Loss (lbs): 26 lb (11.8 kg)   Body Composition  Body Fat %: 47.2 % Fat Mass (lbs): 113.8 lbs Muscle Mass (lbs): 120.8 lbs Total Body Water (lbs): 83.8 lbs Visceral Fat Rating : 14   Other Clinical Data Fasting: yes Labs: no Today's Visit #: 47 Starting Date: 08/09/19    Chief Complaint:   OBESITY Mackenzie [name] is here to discuss her progress with her obesity treatment plan. She is on the the Category 3 Plan and states she is following her eating plan approximately 65 % of the time.  She states she is not currently exercising due to R knee pain.   Interim History:  Since last OV at [facility] on 09/03/2022- Suffered R knee injury possibly r/t to Zimba class. She has been evaluated by Dr. Rhett B[facility], ED and imaging: Xray, [name], MRI  She will undergo R Knee Arthroscopy mid May 2024. She will also close on a new home 10/21/2022  Subjective:   1. Acute pain of right knee 09/26/2022 Ortho OV Notes: Plan: Impression 52 year old female with large medial meniscal tear in the right knee.  Negative for fracture.  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  Based on these findings I recommended arthroscopic knee surgery with partial medial meniscectomy and chondroplasty as indicated.  Risk benefits prognosis reviewed with the patient.  Due to recent steroid injection on the 11th we will need to postpone surgery for at least 4 weeks to mitigate risk of infection.  Mackenzie [name] will call the patient to schedule surgery in the near future.   09/24/2022 MRI R Knee W/O  IMPRESSION: 1. Diffuse tear of the root of the posterior horn  of the medial meniscus with associated mild extrusion of the body of the medial meniscus. 2. Moderate patellofemoral cartilage degenerative changes. Full-thickness cartilage loss within the patellar apex with moderate subchondral marrow edema 3. Small multiloculated Baker's cyst. 4. Mild proximal patellar tendinosis.  2. Essential hypertension Discussed Labs 09/03/2022 CMP- electrolytes and kidney fx- both stable She is currently on: metoprolol succinate (TOPROL-XL) 50 MG 24 hr tablet  rosuvastatin (CRESTOR) 10 MG tablet  losartan (COZAAR) 50 MG tablet  spironolactone (ALDACTONE) 25 MG tablet    3. Pre-diabetes Discussed Labs  Latest Reference Range & Units 09/03/22 15:43  Glucose 70 - 99 mg/dL 75  Hemoglobin Z6X 4.8 - 5.6 % 5.8 (H)  Est. average glucose Bld gHb Est-mCnc mg/dL 096  INSULIN 2.6 - 04.5 uIU/mL 13.1  (H): Data is abnormally high Currently on Metformin  BID with meals Previously on weekly Wegovy therapy- off since early 2024- due to cost of medication.  4. Vitamin D deficiency Discussed Labs  Latest Reference Range & Units 09/03/22 15:43  Vitamin D, 25-Hydroxy 30.0 - 100.0 ng/mL 81.2  Vitamin B12 232 - 1245 pg/mL >2000 (H)  (H): Data is abnormally high Mackenzie [name] is on a daily Multivitamin and daily combo Calcium/D supplement She is also on an additional OTC Vit D3 2,000 or 4,000 IU daily.  5. Emotional Eating Behavior BP/HR excellent  She is on daily Wellbutrin SR  She reports stable mood, denies SI/HI  Assessment/Plan:   1.  Acute pain of right knee Continue with cane and f/u with Ortho as directed  2. Essential hypertension Continue  metoprolol succinate (TOPROL-XL) 50 MG 24 hr tablet  rosuvastatin (CRESTOR) 10 MG tablet  losartan (COZAAR) 50 MG tablet  spironolactone (ALDACTONE) 25 MG tablet   3. Pre-diabetes Refill - metFORMIN (GLUCOPHAGE) 500 MG tablet; Take 1 tablet (500 mg total) by mouth 2 (two) times daily with a meal.   Dispense: 180 tablet; Refill: 0  4. Vitamin D deficiency Continue daily Multivitamin and daily combo Calcium/D  Stop additional OTC Vit D3 2,000 or 4,000 IU  5. Emotional Eating Behavior Refill - buPROPion (WELLBUTRIN SR) 150 MG 12 hr tablet; Take 1 tablet (150 mg total) by mouth daily.  Dispense: 90 tablet; Refill: 0  6. Obesity, Current BMI 40.1  Mackenzie [name] is currently in the action stage of change. As such, her goal is to continue with weight loss efforts. She has agreed to the Category 3 Plan.   Exercise goals:   Behavioral modification strategies: increasing lean protein intake, decreasing simple carbohydrates, increasing vegetables, increasing water intake, no skipping meals, meal planning and cooking strategies, keeping healthy foods in the home, better snacking choices, and planning for success.  Mackenzie [name] has agreed to follow-up with our clinic in 6 weeks. She was informed of the importance of frequent follow-up visits to maximize her success with intensive lifestyle modifications for her multiple health conditions.   Objective:   Blood pressure 114/69, pulse 68, temperature 98 F (36.7 C), height  (1.651 m), weight 241 lb (109.3 kg), last menstrual period 11/28/2016, SpO2 100 %, unknown if currently breastfeeding. Body mass index is 40.1 kg/m.  General: Cooperative, alert, well developed, in no acute distress. HEENT: Conjunctivae and lids unremarkable. Cardiovascular: Regular rhythm.  Lungs: Normal work of breathing. Neurologic: No focal deficits.   Lab Results  Component Value Date   CREATININE 0.90 09/03/2022   BUN 17 09/03/2022   NA 136 09/03/2022   K 4.6 09/03/2022   CL 99 09/03/2022   CO2 23 09/03/2022   Lab Results  Component Value Date   ALT 20 09/03/2022   AST 21 09/03/2022   GGT 19 03/01/2021   ALKPHOS 129 (H) 09/03/2022   BILITOT 0.3 09/03/2022   Lab Results  Component Value Date   HGBA1C 5.8 (H) 09/03/2022   HGBA1C 5.4 01/29/2022   HGBA1C  5.4 12/19/2021   HGBA1C 5.4 05/23/2021   HGBA1C 5.4 02/27/2021   Lab Results  Component Value Date   INSULIN 13.1 09/03/2022   INSULIN 21.3 01/29/2022   INSULIN 18.2 01/07/2022   INSULIN 15.1 05/23/2021   INSULIN 15.1 02/27/2021   Lab Results  Component Value Date   TSH 4.94 07/14/2022   Lab Results  Component Value Date   CHOL 202 (H) 11/15/2020   HDL 58 11/15/2020   LDLCALC 135 (H) 11/15/2020   TRIG 52 11/15/2020   CHOLHDL 3.5 11/15/2020   Lab Results  Component Value Date   VD25OH 81.2 09/03/2022   VD25OH 50.2 01/29/2022   VD25OH 49.2 12/19/2021   Lab Results  Component Value Date   WBC 3.8 (L) 07/23/2021   HGB 11.5 (L) 07/23/2021   HCT 35.9 (L) 07/23/2021   MCV 79.6 07/23/2021   PLT 201.0 07/23/2021   No results found for: "IRON", "TIBC", "FERRITIN"  Attestation Statements:   Reviewed by clinician on day of visit: allergies, medications, problem list, medical history, surgical history, family history, social history, and previous encounter notes.  I have  reviewed the above documentation for accuracy and completeness, and I agree with the above. -  Haziel Molner d. Raegyn Renda, NP-C

## 2022-10-03 ENCOUNTER — Other Ambulatory Visit: Payer: Self-pay | Admitting: Physician Assistant

## 2022-10-03 ENCOUNTER — Telehealth: Payer: Self-pay | Admitting: Physician Assistant

## 2022-10-03 ENCOUNTER — Other Ambulatory Visit (HOSPITAL_COMMUNITY): Payer: Self-pay

## 2022-10-03 MED ORDER — HYDROCODONE-ACETAMINOPHEN 5-325 MG PO TABS
1.0000 | ORAL_TABLET | Freq: Two times a day (BID) | ORAL | 0 refills | Status: DC | PRN
  Filled 2022-10-03: qty 20, 10d supply, fill #0

## 2022-10-03 NOTE — Telephone Encounter (Signed)
Sent but decreased frequency

## 2022-10-03 NOTE — Telephone Encounter (Signed)
Patient needs refill on Hydrocodone sent to [facility]

## 2022-10-06 ENCOUNTER — Telehealth: Payer: Self-pay | Admitting: Orthopaedic Surgery

## 2022-10-06 NOTE — Telephone Encounter (Signed)
Patient would like to hold off on scheduling right knee scope with Dr. Roda Shutters.  She will call when ready to set a date.

## 2022-10-07 ENCOUNTER — Telehealth (INDEPENDENT_AMBULATORY_CARE_PROVIDER_SITE_OTHER): Payer: Self-pay | Admitting: Adult Health

## 2022-10-07 NOTE — Telephone Encounter (Signed)
PA for Saxenda submitted and denied with the following explanation: This drug/product is not covered under the pharmacy benefit. Prior Authorization is not available.

## 2022-10-13 ENCOUNTER — Telehealth: Payer: Self-pay | Admitting: *Deleted

## 2022-10-13 NOTE — Telephone Encounter (Signed)
Mackenzie [name]: Spoke with patient on the phone to complete study visit for the [facility] trial.  SAEs: Patient denies any serious medical problems or hospital admissions since last study visit. Mackenzie [name] has a medial meniscal tear in her right knee with planned arthroscopic knee surgery in a few months when patient can arrange it.  Mackenzie [name] had one ED visit for the knee pain and was discharged home to follow up with Orthopedic.  HCRU:  Mackenzie [name] did not have any unscheduled visits in our clinic for any issues related to her breast cancer or treatment since her last study visit.  Endocrine Treatment: Patient states Mackenzie [name] continues to take letrozole as prescribed.  Plan: Mammogram is scheduled for June 3rd and next study visit with Dr. Al Pimple July 29th. Patient confirmed.  Encouraged patient to contact research nurse for any questions or concerns prior to next visit. Mackenzie [name] verbalized understanding.  [name] Reamer, BSN, RN, [employer] Research Nurse II [phone] 10/13/2022 10:04 AM

## 2022-10-15 ENCOUNTER — Other Ambulatory Visit (HOSPITAL_COMMUNITY): Payer: Self-pay

## 2022-10-22 ENCOUNTER — Encounter (HOSPITAL_BASED_OUTPATIENT_CLINIC_OR_DEPARTMENT_OTHER): Payer: Self-pay

## 2022-10-22 ENCOUNTER — Ambulatory Visit (HOSPITAL_BASED_OUTPATIENT_CLINIC_OR_DEPARTMENT_OTHER): Admit: 2022-10-22 | Payer: 59 | Admitting: Orthopaedic Surgery

## 2022-10-22 SURGERY — ARTHROSCOPY, KNEE, WITH MEDIAL MENISCECTOMY
Anesthesia: General | Site: Knee | Laterality: Right

## 2022-10-28 DIAGNOSIS — H47332 Pseudopapilledema of optic disc, left eye: Secondary | ICD-10-CM | POA: Diagnosis not present

## 2022-10-29 ENCOUNTER — Ambulatory Visit (INDEPENDENT_AMBULATORY_CARE_PROVIDER_SITE_OTHER): Payer: 59 | Admitting: Adult Health

## 2022-10-29 ENCOUNTER — Encounter (INDEPENDENT_AMBULATORY_CARE_PROVIDER_SITE_OTHER): Payer: Self-pay | Admitting: Adult Health

## 2022-10-29 VITALS — BP 120/79 | HR 70 | Temp 98.2°F | Ht 65.0 in | Wt 247.0 lb

## 2022-10-29 DIAGNOSIS — M25561 Pain in right knee: Secondary | ICD-10-CM | POA: Diagnosis not present

## 2022-10-29 DIAGNOSIS — E669 Obesity, unspecified: Secondary | ICD-10-CM

## 2022-10-29 DIAGNOSIS — Z6841 Body Mass Index (BMI) 40.0 and over, adult: Secondary | ICD-10-CM | POA: Diagnosis not present

## 2022-10-29 DIAGNOSIS — R7303 Prediabetes: Secondary | ICD-10-CM | POA: Diagnosis not present

## 2022-10-29 NOTE — Progress Notes (Signed)
WEIGHT SUMMARY AND BIOMETRICS  Vitals Temp: 98.2 F (36.8 C) BP: 120/79 Pulse Rate: 70 SpO2: 99 %   Anthropometric Measurements Height: 5\' 5"  (1.651 m) Weight: 247 lb (112 kg) BMI (Calculated): 41.1 Weight Gained Since Last Visit: 6 lb Starting Weight: 267 lb   Body Composition  Body Fat %: 47.9 % Fat Mass (lbs): 118.4 lbs Muscle Mass (lbs): 122.2 lbs Total Body Water (lbs): 85.6 lbs Visceral Fat Rating : 15   Other Clinical Data Fasting: Yes Labs: No Today's Visit #: 12 Starting Date: 08/09/19    Chief Complaint:   OBESITY Mackenzie [name] is here to discuss her progress with her obesity treatment plan. She is on the the Category 3 Plan and states she is following her eating plan approximately 65 % of the time. She states she is not currently exercising due to current R knee pain.   Interim History:  [name] was scheduled for RIGHT KNEE ARTHROSCOPY, PARTIAL MEDIAL MENISCECTOMY 10/22/2022- cancelled, now scheduled for 11/12/2022 Procedure witll be at [facility]- Out Patient. She reports that her two of her daughters and her ex-husband will be able to assist her in her recovery.  Of Note- Been off Wegovy 2.4 since early 2024  Subjective:   1. Acute pain of right knee Ms. [name] was scheduled for RIGHT KNEE ARTHROSCOPY, PARTIAL MEDIAL MENISCECTOMY 10/22/2022- cancelled, now scheduled for 11/12/2022 Procedure witll be at [facility]. She reports that her two of her daughters and her ex-husband will be able to assist her in her recovery.  2. Pre-diabetes Lab Results  Component Value Date   HGBA1C 5.8 (H) 09/03/2022   HGBA1C 5.4 01/29/2022   HGBA1C 5.4 12/19/2021    Been off Wegovy 2.4 since early 2024 She never started Saxenda therapy as it was cost prohibitive.  Assessment/Plan:   1. Acute pain of right knee Increase protein intake. Continue with assistive devices. Strongly urge you take all recommended time off per  surgeon  2. Pre-diabetes 30 g protein per meal Increase protein at snacks  3. Obesity, Current BMI 41.1  Mackenzie [name] is currently in the action stage of change. As such, her goal is to continue with weight loss efforts. She has agreed to the Category 3 Plan.   Exercise goals: All adults should avoid inactivity. Some physical activity is better than none, and adults who participate in any amount of physical activity gain some health benefits.  Behavioral modification strategies: increasing lean protein intake, decreasing simple carbohydrates, increasing vegetables, increasing water intake, no skipping meals, meal planning and cooking strategies, and planning for success.  Mackenzie [name] has agreed to follow-up with our clinic in 4 weeks. She was informed of the importance of frequent follow-up visits to maximize her success with intensive lifestyle modifications for her multiple health conditions.   Objective:   Blood pressure 120/79, pulse 70, temperature 98.2 F (36.8 C), height 5\' 5"  (1.651 m), weight 247 lb (112 kg), last menstrual period 11/28/2016, SpO2 99 %, unknown if currently breastfeeding. Body mass index is 41.1 kg/m.  General: Cooperative, alert, well developed, in no acute distress. HEENT: Conjunctivae and lids unremarkable. Cardiovascular: Regular rhythm.  Lungs: Normal work of breathing. Neurologic: No focal deficits.   Lab Results  Component Value Date   CREATININE 0.90 09/03/2022   BUN 17 09/03/2022   NA 136 09/03/2022   K 4.6 09/03/2022   CL 99 09/03/2022   CO2 23 09/03/2022   Lab Results  Component Value Date  ALT 20 09/03/2022   AST 21 09/03/2022   GGT 19 03/01/2021   ALKPHOS 129 (H) 09/03/2022   BILITOT 0.3 09/03/2022   Lab Results  Component Value Date   HGBA1C 5.8 (H) 09/03/2022   HGBA1C 5.4 01/29/2022   HGBA1C 5.4 12/19/2021   HGBA1C 5.4 05/23/2021   HGBA1C 5.4 02/27/2021   Lab Results  Component Value Date   INSULIN 13.1 09/03/2022   INSULIN  21.3 01/29/2022   INSULIN 18.2 01/07/2022   INSULIN 15.1 05/23/2021   INSULIN 15.1 02/27/2021   Lab Results  Component Value Date   TSH 4.94 07/14/2022   Lab Results  Component Value Date   CHOL 202 (H) 11/15/2020   HDL 58 11/15/2020   LDLCALC 135 (H) 11/15/2020   TRIG 52 11/15/2020   CHOLHDL 3.5 11/15/2020   Lab Results  Component Value Date   VD25OH 81.2 09/03/2022   VD25OH 50.2 01/29/2022   VD25OH 49.2 12/19/2021   Lab Results  Component Value Date   WBC 3.8 (L) 07/23/2021   HGB 11.5 (L) 07/23/2021   HCT 35.9 (L) 07/23/2021   MCV 79.6 07/23/2021   PLT 201.0 07/23/2021   No results found for: "IRON", "TIBC", "FERRITIN"  Attestation Statements:   Reviewed by clinician on day of visit: allergies, medications, problem list, medical history, surgical history, family history, social history, and previous encounter notes.  Time spent on visit including pre-visit chart review and post-visit care and charting was 28 minutes.   I have reviewed the above documentation for accuracy and completeness, and I agree with the above. -  Oshua Mcconaha d. Mackenzie Hartinger, NP-C

## 2022-10-31 ENCOUNTER — Other Ambulatory Visit (HOSPITAL_COMMUNITY): Payer: Self-pay

## 2022-10-31 ENCOUNTER — Other Ambulatory Visit: Payer: Self-pay

## 2022-10-31 MED ORDER — MONTELUKAST SODIUM 10 MG PO TABS
10.0000 mg | ORAL_TABLET | Freq: Every day | ORAL | 0 refills | Status: DC
  Filled 2022-10-31: qty 90, 90d supply, fill #0

## 2022-11-05 ENCOUNTER — Encounter (HOSPITAL_BASED_OUTPATIENT_CLINIC_OR_DEPARTMENT_OTHER): Payer: Self-pay | Admitting: Orthopaedic Surgery

## 2022-11-05 ENCOUNTER — Other Ambulatory Visit: Payer: Self-pay

## 2022-11-07 ENCOUNTER — Encounter (HOSPITAL_COMMUNITY): Payer: Self-pay | Admitting: Pharmacist

## 2022-11-07 ENCOUNTER — Other Ambulatory Visit (HOSPITAL_COMMUNITY): Payer: Self-pay

## 2022-11-10 ENCOUNTER — Encounter (HOSPITAL_BASED_OUTPATIENT_CLINIC_OR_DEPARTMENT_OTHER)
Admission: RE | Admit: 2022-11-10 | Discharge: 2022-11-10 | Disposition: A | Discharge status: Home / Self Care | Payer: 59 | Source: Ambulatory Visit | Attending: Orthopaedic Surgery | Admitting: Orthopaedic Surgery

## 2022-11-10 ENCOUNTER — Other Ambulatory Visit (HOSPITAL_COMMUNITY): Payer: Self-pay

## 2022-11-10 ENCOUNTER — Ambulatory Visit
Admission: RE | Admit: 2022-11-10 | Discharge: 2022-11-10 | Disposition: A | Discharge status: Home / Self Care | Payer: 59 | Source: Ambulatory Visit | Attending: Hematology and Oncology | Admitting: Hematology and Oncology

## 2022-11-10 DIAGNOSIS — C50212 Malignant neoplasm of upper-inner quadrant of left female breast: Secondary | ICD-10-CM

## 2022-11-10 DIAGNOSIS — Z01818 Encounter for other preprocedural examination: Secondary | ICD-10-CM | POA: Diagnosis not present

## 2022-11-10 DIAGNOSIS — Z1231 Encounter for screening mammogram for malignant neoplasm of breast: Secondary | ICD-10-CM | POA: Diagnosis not present

## 2022-11-10 LAB — BASIC METABOLIC PANEL
Anion gap: 11 (ref 5–15)
BUN: 21 mg/dL — ABNORMAL HIGH (ref 6–20)
CO2: 24 mmol/L (ref 22–32)
Calcium: 9.8 mg/dL (ref 8.9–10.3)
Chloride: 102 mmol/L (ref 98–111)
Creatinine, Ser: 0.79 mg/dL (ref 0.44–1.00)
GFR, Estimated: >60 mL/min (ref >60)
Glucose, Bld: 104 mg/dL — ABNORMAL HIGH (ref 70–99)
Potassium: 5.2 mmol/L — ABNORMAL HIGH (ref 3.5–5.1)
Sodium: 137 mmol/L (ref 135–145)

## 2022-11-11 ENCOUNTER — Ambulatory Visit (INDEPENDENT_AMBULATORY_CARE_PROVIDER_SITE_OTHER): Payer: 59 | Admitting: Adult Health

## 2022-11-12 ENCOUNTER — Encounter: Payer: Self-pay | Admitting: Orthopaedic Surgery

## 2022-11-12 ENCOUNTER — Ambulatory Visit (HOSPITAL_BASED_OUTPATIENT_CLINIC_OR_DEPARTMENT_OTHER): Payer: 59 | Admitting: Anesthesiology

## 2022-11-12 ENCOUNTER — Other Ambulatory Visit (HOSPITAL_COMMUNITY): Payer: Self-pay

## 2022-11-12 ENCOUNTER — Ambulatory Visit (HOSPITAL_BASED_OUTPATIENT_CLINIC_OR_DEPARTMENT_OTHER)
Admission: RE | Admit: 2022-11-12 | Discharge: 2022-11-12 | Disposition: A | Discharge status: Home / Self Care | Payer: 59 | Attending: Orthopaedic Surgery | Admitting: Orthopaedic Surgery

## 2022-11-12 ENCOUNTER — Encounter (HOSPITAL_BASED_OUTPATIENT_CLINIC_OR_DEPARTMENT_OTHER)
Admission: RE | Disposition: A | Discharge status: Home / Self Care | Payer: Self-pay | Source: Home / Self Care | Attending: Orthopaedic Surgery

## 2022-11-12 ENCOUNTER — Encounter (HOSPITAL_BASED_OUTPATIENT_CLINIC_OR_DEPARTMENT_OTHER): Payer: Self-pay | Admitting: Orthopaedic Surgery

## 2022-11-12 ENCOUNTER — Other Ambulatory Visit: Payer: Self-pay

## 2022-11-12 DIAGNOSIS — I10 Essential (primary) hypertension: Secondary | ICD-10-CM | POA: Diagnosis not present

## 2022-11-12 DIAGNOSIS — S83241A Other tear of medial meniscus, current injury, right knee, initial encounter: Secondary | ICD-10-CM | POA: Diagnosis not present

## 2022-11-12 DIAGNOSIS — K219 Gastro-esophageal reflux disease without esophagitis: Secondary | ICD-10-CM | POA: Insufficient documentation

## 2022-11-12 DIAGNOSIS — M94261 Chondromalacia, right knee: Secondary | ICD-10-CM | POA: Insufficient documentation

## 2022-11-12 DIAGNOSIS — S83241D Other tear of medial meniscus, current injury, right knee, subsequent encounter: Secondary | ICD-10-CM | POA: Diagnosis not present

## 2022-11-12 DIAGNOSIS — X58XXXA Exposure to other specified factors, initial encounter: Secondary | ICD-10-CM | POA: Insufficient documentation

## 2022-11-12 DIAGNOSIS — R7303 Prediabetes: Secondary | ICD-10-CM | POA: Diagnosis not present

## 2022-11-12 DIAGNOSIS — Z6841 Body Mass Index (BMI) 40.0 and over, adult: Secondary | ICD-10-CM | POA: Diagnosis not present

## 2022-11-12 DIAGNOSIS — M6751 Plica syndrome, right knee: Secondary | ICD-10-CM | POA: Diagnosis not present

## 2022-11-12 DIAGNOSIS — Z79899 Other long term (current) drug therapy: Secondary | ICD-10-CM | POA: Diagnosis not present

## 2022-11-12 HISTORY — PX: KNEE ARTHROSCOPY WITH MEDIAL MENISECTOMY: SHX5651

## 2022-11-12 LAB — GLUCOSE, CAPILLARY: Glucose-Capillary: 95 mg/dL (ref 70–99)

## 2022-11-12 SURGERY — ARTHROSCOPY, KNEE, WITH MEDIAL MENISCECTOMY
Anesthesia: General | Site: Knee | Laterality: Right

## 2022-11-12 MED ORDER — OXYCODONE HCL 5 MG PO TABS
5.0000 mg | ORAL_TABLET | Freq: Once | ORAL | Status: AC | PRN
  Administered 2022-11-12: 5 mg via ORAL

## 2022-11-12 MED ORDER — FENTANYL CITRATE (PF) 100 MCG/2ML IJ SOLN
INTRAMUSCULAR | Status: DC | PRN
  Administered 2022-11-12 (×2): 50 ug via INTRAVENOUS

## 2022-11-12 MED ORDER — ACETAMINOPHEN 500 MG PO TABS
1000.0000 mg | ORAL_TABLET | Freq: Once | ORAL | Status: AC
  Administered 2022-11-12: 1000 mg via ORAL

## 2022-11-12 MED ORDER — LIDOCAINE 2% (20 MG/ML) 5 ML SYRINGE
INTRAMUSCULAR | Status: AC
  Filled 2022-11-12: qty 5

## 2022-11-12 MED ORDER — PROPOFOL 10 MG/ML IV BOLUS
INTRAVENOUS | Status: AC
  Filled 2022-11-12: qty 20

## 2022-11-12 MED ORDER — ACETAMINOPHEN 500 MG PO TABS
ORAL_TABLET | ORAL | Status: AC
  Filled 2022-11-12: qty 2

## 2022-11-12 MED ORDER — PROMETHAZINE HCL 25 MG/ML IJ SOLN: 6.2500 mg | INTRAMUSCULAR | Status: DC | PRN

## 2022-11-12 MED ORDER — KETOROLAC TROMETHAMINE 30 MG/ML IJ SOLN
INTRAMUSCULAR | Status: AC
  Filled 2022-11-12: qty 1

## 2022-11-12 MED ORDER — OXYCODONE HCL 5 MG/5ML PO SOLN: 5.0000 mg | Freq: Once | ORAL | Status: AC | PRN

## 2022-11-12 MED ORDER — ONDANSETRON HCL 4 MG/2ML IJ SOLN
INTRAMUSCULAR | Status: DC | PRN
  Administered 2022-11-12: 4 mg via INTRAVENOUS

## 2022-11-12 MED ORDER — HYDROCODONE-ACETAMINOPHEN 5-325 MG PO TABS
1.0000 | ORAL_TABLET | Freq: Four times a day (QID) | ORAL | 0 refills | Status: DC | PRN
  Filled 2022-11-12: qty 20, 5d supply, fill #0

## 2022-11-12 MED ORDER — LACTATED RINGERS IV SOLN: INTRAVENOUS | Status: DC

## 2022-11-12 MED ORDER — DEXAMETHASONE SODIUM PHOSPHATE 4 MG/ML IJ SOLN
INTRAMUSCULAR | Status: DC | PRN
  Administered 2022-11-12: 5 mg via INTRAVENOUS

## 2022-11-12 MED ORDER — CEFAZOLIN SODIUM-DEXTROSE 2-4 GM/100ML-% IV SOLN
INTRAVENOUS | Status: AC
  Filled 2022-11-12: qty 100

## 2022-11-12 MED ORDER — CEFAZOLIN SODIUM-DEXTROSE 2-4 GM/100ML-% IV SOLN
2.0000 g | INTRAVENOUS | Status: AC
  Administered 2022-11-12: 2 g via INTRAVENOUS

## 2022-11-12 MED ORDER — LIDOCAINE HCL (CARDIAC) PF 100 MG/5ML IV SOSY
PREFILLED_SYRINGE | INTRAVENOUS | Status: DC | PRN
  Administered 2022-11-12: 60 mg via INTRAVENOUS

## 2022-11-12 MED ORDER — PROPOFOL 10 MG/ML IV BOLUS
INTRAVENOUS | Status: DC | PRN
  Administered 2022-11-12: 200 mg via INTRAVENOUS

## 2022-11-12 MED ORDER — OXYCODONE HCL 5 MG PO TABS
ORAL_TABLET | ORAL | Status: AC
  Filled 2022-11-12: qty 1

## 2022-11-12 MED ORDER — DEXAMETHASONE SODIUM PHOSPHATE 10 MG/ML IJ SOLN
INTRAMUSCULAR | Status: AC
  Filled 2022-11-12: qty 1

## 2022-11-12 MED ORDER — PROPOFOL 500 MG/50ML IV EMUL
INTRAVENOUS | Status: AC
  Filled 2022-11-12: qty 50

## 2022-11-12 MED ORDER — AMISULPRIDE (ANTIEMETIC) 5 MG/2ML IV SOLN: 10.0000 mg | Freq: Once | INTRAVENOUS | Status: DC | PRN

## 2022-11-12 MED ORDER — SODIUM CHLORIDE 0.9 % IR SOLN
Status: DC | PRN
  Administered 2022-11-12: 3000 mL

## 2022-11-12 MED ORDER — FENTANYL CITRATE (PF) 100 MCG/2ML IJ SOLN
25.0000 ug | INTRAMUSCULAR | Status: DC | PRN
  Administered 2022-11-12 (×2): 50 ug via INTRAVENOUS

## 2022-11-12 MED ORDER — MIDAZOLAM HCL 2 MG/2ML IJ SOLN
INTRAMUSCULAR | Status: AC
  Filled 2022-11-12: qty 2

## 2022-11-12 MED ORDER — FENTANYL CITRATE (PF) 100 MCG/2ML IJ SOLN
INTRAMUSCULAR | Status: AC
  Filled 2022-11-12: qty 2

## 2022-11-12 MED ORDER — IBUPROFEN 800 MG PO TABS
800.0000 mg | ORAL_TABLET | Freq: Three times a day (TID) | ORAL | 2 refills | Status: DC | PRN
  Filled 2022-11-12: qty 30, 10d supply, fill #0
  Filled 2022-12-01: qty 30, 10d supply, fill #1
  Filled 2023-01-14: qty 30, 10d supply, fill #2

## 2022-11-12 MED ORDER — KETOROLAC TROMETHAMINE 30 MG/ML IJ SOLN
30.0000 mg | Freq: Once | INTRAMUSCULAR | Status: AC | PRN
  Administered 2022-11-12: 30 mg via INTRAVENOUS

## 2022-11-12 MED ORDER — BUPIVACAINE HCL (PF) 0.25 % IJ SOLN
INTRAMUSCULAR | Status: DC | PRN
  Administered 2022-11-12: 20 mL

## 2022-11-12 MED ORDER — ONDANSETRON HCL 4 MG/2ML IJ SOLN
INTRAMUSCULAR | Status: AC
  Filled 2022-11-12: qty 2

## 2022-11-12 SURGICAL SUPPLY — 43 items
BANDAGE ESMARK 6X9 LF (GAUZE/BANDAGES/DRESSINGS) IMPLANT
BLADE EXCALIBUR 4.0X13 (MISCELLANEOUS) IMPLANT
BLADE SHAVER TORPEDO 4X13 (MISCELLANEOUS) ×1 IMPLANT
BNDG CMPR 5X4 KNIT ELC UNQ LF (GAUZE/BANDAGES/DRESSINGS)
BNDG CMPR 5X62 HK CLSR LF (GAUZE/BANDAGES/DRESSINGS) ×1
BNDG CMPR 6"X 5 YARDS HK CLSR (GAUZE/BANDAGES/DRESSINGS) ×1
BNDG CMPR 9X6 STRL LF SNTH (GAUZE/BANDAGES/DRESSINGS)
BNDG ELASTIC 4INX 5YD STR LF (GAUZE/BANDAGES/DRESSINGS) IMPLANT
BNDG ELASTIC 6INX 5YD STR LF (GAUZE/BANDAGES/DRESSINGS) ×1 IMPLANT
BNDG ESMARK 6X9 LF (GAUZE/BANDAGES/DRESSINGS)
BURR OVAL 8 FLU 4.0X13 (MISCELLANEOUS) IMPLANT
COOLER ICEMAN CLASSIC (MISCELLANEOUS) ×1 IMPLANT
CUFF TOURN SGL QUICK 34 (TOURNIQUET CUFF) ×1
CUFF TRNQT CYL 34X4.125X (TOURNIQUET CUFF) ×1 IMPLANT
CUTTER BONE 4.0MM X 13CM (MISCELLANEOUS) IMPLANT
DISSECTOR 3.5MM X 13CM CVD (MISCELLANEOUS) IMPLANT
DRAPE ARTHROSCOPY W/POUCH 90 (DRAPES) ×1 IMPLANT
DRAPE IMP U-DRAPE 54X76 (DRAPES) ×1 IMPLANT
DRAPE U-SHAPE 47X51 STRL (DRAPES) ×1 IMPLANT
DURAPREP 26ML APPLICATOR (WOUND CARE) ×2 IMPLANT
ELECT MENISCUS 165MM 90D (ELECTRODE) IMPLANT
EXCALIBUR 3.8MM X 13CM (MISCELLANEOUS) IMPLANT
GAUZE SPONGE 4X4 12PLY STRL (GAUZE/BANDAGES/DRESSINGS) ×1 IMPLANT
GAUZE XEROFORM 1X8 LF (GAUZE/BANDAGES/DRESSINGS) ×1 IMPLANT
GLOVE BIOGEL PI IND STRL 7.5 (GLOVE) ×1 IMPLANT
GLOVE ECLIPSE 7.0 STRL STRAW (GLOVE) ×2 IMPLANT
GLOVE INDICATOR 7.0 STRL GRN (GLOVE) ×2 IMPLANT
GLOVE SURG SYN 7.5 E (GLOVE) ×2 IMPLANT
GLOVE SURG SYN 7.5 PF PI (GLOVE) ×2 IMPLANT
GOWN STRL REUS W/ TWL LRG LVL3 (GOWN DISPOSABLE) ×1 IMPLANT
GOWN STRL REUS W/TWL LRG LVL3 (GOWN DISPOSABLE) ×1
GOWN STRL SURGICAL XL XLNG (GOWN DISPOSABLE) ×2 IMPLANT
MANIFOLD NEPTUNE II (INSTRUMENTS) ×1 IMPLANT
PACK ARTHROSCOPY DSU (CUSTOM PROCEDURE TRAY) ×1 IMPLANT
PACK BASIN DAY SURGERY FS (CUSTOM PROCEDURE TRAY) ×1 IMPLANT
PAD COLD SHLDR WRAP-ON (PAD) ×1 IMPLANT
PENCIL SMOKE EVACUATOR (MISCELLANEOUS) IMPLANT
RESECTOR TORPEDO 4MM 13CM CVD (MISCELLANEOUS) IMPLANT
SHEET MEDIUM DRAPE 40X70 STRL (DRAPES) ×1 IMPLANT
SLEEVE SCD COMPRESS KNEE MED (STOCKING) ×1 IMPLANT
SUT ETHILON 3 0 PS 1 (SUTURE) ×1 IMPLANT
TOWEL GREEN STERILE FF (TOWEL DISPOSABLE) ×1 IMPLANT
TUBING ARTHROSCOPY IRRIG 16FT (MISCELLANEOUS) ×1 IMPLANT

## 2022-11-12 NOTE — Anesthesia Postprocedure Evaluation (Signed)
Anesthesia Post Note  Patient: Mackenzie [name]  Procedure(s) Performed: RIGHT KNEE ARTHROSCOPY, PARTIAL MEDIAL MENISCECTOMY (Right: Knee)     Patient location during evaluation: PACU Anesthesia Type: General Level of consciousness: awake Pain management: pain level controlled Vital Signs Assessment: post-procedure vital signs reviewed and stable Respiratory status: spontaneous breathing, nonlabored ventilation and respiratory function stable Cardiovascular status: blood pressure returned to baseline and stable Postop Assessment: no apparent nausea or vomiting Anesthetic complications: no   No notable events documented.  Last Vitals:  Vitals:   11/12/22 1330 11/12/22 1345  BP: 111/74 124/78  Pulse: (!) 56 63  Resp: 12 12  Temp:    SpO2: 100% 97%    Last Pain:  Vitals:   11/12/22 1339  TempSrc:   PainSc: 4                  Ryla Cauthon P Alilah Mcmeans

## 2022-11-12 NOTE — Discharge Instructions (Addendum)
Post-operative patient instructions  Knee Arthroscopy   Ice:  Place intermittent ice or cooler pack over your knee, 30 minutes on and 30 minutes off.  Continue this for the first 72 hours after surgery, then save ice for use after therapy sessions or on more active days.   Weight:  You may bear weight on your leg as your symptoms allow. Crutches:  Use crutches (or walker) to assist in walking until told to discontinue by your physical therapist or physician. This will help to reduce pain. Strengthening:  Perform simple thigh squeezes (isometric quad contractions) and straight leg lifts as you are able (3 sets of 5 to 10 repetitions, 3 times a day).  For the leg lifts, have someone support under your ankle in the beginning until you have increased strength enough to do this on your own.  To help get started on thigh squeezes, place a pillow under your knee and push down on the pillow with back of knee (sometimes easier to do than with your leg fully straight). Motion:  Perform gentle knee motion as tolerated - this is gentle bending and straightening of the knee. Seated heel slides: you can start by sitting in a chair, remove your brace, and gently slide your heel back on the floor - allowing your knee to bend. Have someone help you straighten your knee (or use your other leg/foot hooked under your ankle.  Dressing:  Perform 1st dressing change at 2 days postoperative. A moderate amount of blood tinged drainage is to be expected.  So if you bleed through the dressing on the first or second day or if you have fevers, it is fine to change the dressing/check the wounds early and redress wound. Elevate your leg.  If it bleeds through again, or if the incisions are leaking frank blood, please call the office. May change dressing every 1-2 days thereafter to help watch wounds. Can purchase Tegaderm (or 15M Nexcare) water resistant dressings at local pharmacy / [employer]. Shower:  Light shower is ok after 2  days.  Please take shower, NO bath. Recover with gauze and ace wrap to help keep wounds protected.   Pain medication:  A narcotic pain medication has been prescribed.  Take as directed.  Typically you need narcotic pain medication more regularly during the first 3 to 5 days after surgery.  Decrease your use of the medication as the pain improves.  Narcotics can sometimes cause constipation, even after a few doses.  If you have problems with constipation, you can take an over the counter stool softener or light laxative.  If you have persistent problems, please notify your physician's office. Physical therapy: Additional activity guidelines to be provided by your physician or physical therapist at follow-up visits.  Driving: Do not recommend driving x 1 week post surgical, especially if surgery performed on right side. Should not drive while taking narcotic pain medications. It typically takes at least 1 week to restore sufficient neuromuscular function for normal reaction times for driving safety.  Call [phone] for questions or problems. Evenings you will be forwarded to the hospital operator.  Ask for the orthopaedic physician on call. Please call if you experience:    Redness, foul smelling, or persistent drainage from the surgical site  worsening knee pain and swelling not responsive to medication  any calf pain and or swelling of the lower leg  temperatures greater than 101.5 F other questions or concerns   Thank you for allowing [name] to be  a part of your care.  Post Anesthesia Home Care Instructions  Activity: Next tylenol dose 5pm Get plenty of rest for the remainder of the day. A responsible individual must stay with you for 24 hours following the procedure.  For the next 24 hours, DO NOT: -Drive a car -Advertising copywriter -Drink alcoholic beverages -Take any medication unless instructed by your physician -Make any legal decisions or sign important papers.  Meals: Start with  liquid foods such as gelatin or soup. Progress to regular foods as tolerated. Avoid greasy, spicy, heavy foods. If nausea and/or vomiting occur, drink only clear liquids until the nausea and/or vomiting subsides. Call your physician if vomiting continues.  Special Instructions/Symptoms: Your throat may feel dry or sore from the anesthesia or the breathing tube placed in your throat during surgery. If this causes discomfort, gargle with warm salt water. The discomfort should disappear within 24 hours.  If you had a scopolamine patch placed behind your ear for the management of post- operative nausea and/or vomiting:  1. The medication in the patch is effective for 72 hours, after which it should be removed.  Wrap patch in a tissue and discard in the trash. Wash hands thoroughly with soap and water. 2. You may remove the patch earlier than 72 hours if you experience unpleasant side effects which may include dry mouth, dizziness or visual disturbances. 3. Avoid touching the patch. Wash your hands with soap and water after contact with the patch.    Post Anesthesia Home Care Instructions  Activity: Get plenty of rest for the remainder of the day. A responsible individual must stay with you for 24 hours following the procedure.  For the next 24 hours, DO NOT: -Drive a car -Advertising copywriter -Drink alcoholic beverages -Take any medication unless instructed by your physician -Make any legal decisions or sign important papers.  Meals: Start with liquid foods such as gelatin or soup. Progress to regular foods as tolerated. Avoid greasy, spicy, heavy foods. If nausea and/or vomiting occur, drink only clear liquids until the nausea and/or vomiting subsides. Call your physician if vomiting continues.  Special Instructions/Symptoms: Your throat may feel dry or sore from the anesthesia or the breathing tube placed in your throat during surgery. If this causes discomfort, gargle with warm salt water. The  discomfort should disappear within 24 hours.  If you had a scopolamine patch placed behind your ear for the management of post- operative nausea and/or vomiting:  1. The medication in the patch is effective for 72 hours, after which it should be removed.  Wrap patch in a tissue and discard in the trash. Wash hands thoroughly with soap and water. 2. You may remove the patch earlier than 72 hours if you experience unpleasant side effects which may include dry mouth, dizziness or visual disturbances. 3. Avoid touching the patch. Wash your hands with soap and water after contact with the patch.

## 2022-11-12 NOTE — Anesthesia Preprocedure Evaluation (Addendum)
Anesthesia Evaluation  Patient identified by MRN, date of birth, ID band Patient awake    Reviewed: Allergy & Precautions, NPO status , Patient's Chart, lab work & pertinent test results  Airway Mallampati: II  TM Distance: >3 FB Neck ROM: Full    Dental  (+) Chipped,    Pulmonary neg pulmonary ROS   Pulmonary exam normal        Cardiovascular hypertension, Pt. on medications and Pt. on home beta blockers Normal cardiovascular exam     Neuro/Psych  PSYCHIATRIC DISORDERS  Depression    negative neurological ROS     GI/Hepatic Neg liver ROS,GERD  Medicated and Controlled,,  Endo/Other    Morbid obesityPRE-DM  Renal/GU negative Renal ROS     Musculoskeletal negative musculoskeletal ROS (+)    Abdominal  (+) + obese  Peds  Hematology negative hematology ROS (+)   Anesthesia Other Findings right knee medial meniscal tear  Reproductive/Obstetrics                             Anesthesia Physical Anesthesia Plan  ASA: 3  Anesthesia Plan: General   Post-op Pain Management:    Induction: Intravenous  PONV Risk Score and Plan: 3 and Ondansetron, Dexamethasone, Midazolam and Treatment may vary due to age or medical condition  Airway Management Planned: LMA  Additional Equipment:   Intra-op Plan:   Post-operative Plan: Extubation in OR  Informed Consent: I have reviewed the patients History and Physical, chart, labs and discussed the procedure including the risks, benefits and alternatives for the proposed anesthesia with the patient or authorized representative who has indicated his/her understanding and acceptance.     Dental advisory given  Plan Discussed with: CRNA  Anesthesia Plan Comments:        Anesthesia Quick Evaluation

## 2022-11-12 NOTE — H&P (Signed)
PREOPERATIVE H&P  Chief Complaint: right knee medial meniscal tear  HPI: Mackenzie [name] is a 52 y.o. female who presents for surgical treatment of right knee medial meniscal tear.  She denies any changes in medical history.  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   Allergic rhinitis 2012   Anemia 01/27/2017   prior to starting chemotherapy   Breast cancer (HCC) 01/14/2017   Left breast   Carcinoma of breast metastatic to axillary lymph node, left (HCC) 01/23/2017   Edema, lower extremity    GERD (gastroesophageal reflux disease)    Healthcare maintenance 03/25/2017   Hot flashes 03/2017   Hyperlipidemia 03/20/2016   Morbid obesity with body mass index (BMI) of 40.0 to 49.9 (HCC) 09/17/2017   Pre-diabetes 07/26/2018   Hgb A1C elevated on 07/26/2018, Gestational Diabetes 2012   Seasonal allergies 2012   seasonal allergies causes allergic rhinitis and itchy, dry eyes per pt   Vitamin D deficiency 05/2015   Past Surgical History:  Procedure Laterality Date   BREAST RECONSTRUCTION WITH PLACEMENT OF TISSUE EXPANDER AND FLEX HD (ACELLULAR HYDRATED DERMIS) Left 08/27/2017   Procedure: LEFT BREAST RECONSTRUCTION WITH PLACEMENT OF TISSUE EXPANDER AND FLEX HD;  Surgeon: Peggye Form, DO;  [facility];  Service: Plastics;  Laterality: Left;   CESAREAN SECTION     x2   LATISSIMUS FLAP TO BREAST Left 11/03/2018   LATISSIMUS FLAP TO BREAST Left 11/03/2018   Procedure: LATISSIMUS FLAP TO LEFT BREAST;  Surgeon: Peggye Form, DO;  [facility]: [facility];  Service: Plastics;  Laterality: Left;   MASTECTOMY MODIFIED RADICAL Left 08/27/2017   MASTECTOMY MODIFIED RADICAL Left 08/27/2017   Procedure: LEFT MODIFIED RADICAL MASTECTOMY;  Surgeon: Harriette Bouillon, MD;  [facility]: [facility];  Service: General;  Laterality: Left;   MASTOPEXY Right 03/03/2019   Procedure: RIGHT BREAST MASTOPEXY/REDUCTION;  Surgeon:  Peggye Form, DO;  Location: [facility];  Service: Plastics;  Laterality: Right;  3 hours, please   PORT-A-CATH REMOVAL Right 08/27/2017   Procedure: REMOVAL PORT-A-CATH RIGHT CHEST;  Surgeon: Harriette Bouillon, MD;  [facility];  Service: General;  Laterality: Right;   PORTACATH PLACEMENT Right 01/28/2017   Procedure: INSERTION PORT-A-CATH WITH ULTRASOUND;  Surgeon: Harriette Bouillon, MD;  Location: [facility];  Service: General;  Laterality: Right;   REDUCTION MAMMAPLASTY Right    REMOVAL OF TISSUE EXPANDER AND PLACEMENT OF IMPLANT Left 03/03/2019   Procedure: REMOVAL OF TISSUE EXPANDER AND PLACEMENT OF EXPANDER;  Surgeon: Peggye Form, DO;  Location: [facility];  Service: Plastics;  Laterality: Left;   REMOVAL OF TISSUE EXPANDER AND PLACEMENT OF IMPLANT Left 05/25/2019   Procedure: LEFT BREAST REMOVAL OF TISSUE EXPANDER AND PLACEMENT OF IMPLANT;  Surgeon: Peggye Form, DO;  Location: [facility];  Service: Plastics;  Laterality: Left;   TISSUE EXPANDER PLACEMENT Left 11/03/2018   Procedure: PLACEMENT OF TISSUE EXPANDER LEFT BREAST;  Surgeon: Peggye Form, DO;  [facility];  Service: Plastics;  Laterality: Left;  Total case time is 3.5 hours   TUBAL LIGATION Bilateral 01/21/2011   Social History   Socioeconomic History   Marital status: Legally Separated    Spouse name: Not on file   Number of children: 4   Years of education: Not on file   Highest education level: Not on file  Occupational History   Occupation: Charity fundraiser  Tobacco  Use   Smoking status: Never   Smokeless tobacco: Never  Vaping Use   Vaping Use: Never used  Substance and Sexual Activity   Alcohol use: Yes    Comment: social   Drug use: No   Sexual activity: Not on file    Comment: BTL  Other Topics Concern   Not on file  Social History Narrative   Right handed    Rx glasses    Rare caffeine use    Social Determinants of  Health   Financial Resource Strain: Not on file  Food Insecurity: Not on file  Transportation Needs: Not on file  Physical Activity: Not on file  Stress: Not on file  Social Connections: Not on file   Family History  Problem Relation Age of Onset   Other Mother        d.56 from complications of surgery to remove brain tumor   Diabetes Mother    Hypertension Mother    Stroke Mother    Kidney disease Father    Breast cancer Paternal Grandmother 3       d.60s from breast cancer. Did not have treatment.   Hypertension Other    Diabetes Other    Stroke Other    Allergies  Allergen Reactions   Dilaudid [Hydromorphone] Itching   Prior to Admission medications   Medication Sig Start Date End Date Taking? Authorizing Provider  acetaminophen (TYLENOL) 500 MG tablet Take 1,000 mg by mouth every 6 (six) hours as needed (for pain/headaches.).    Yes [provider]  Ascorbic Acid (VITAMIN C PO) Take by mouth daily. 06/07/21  Yes [provider]  buPROPion (WELLBUTRIN SR) 150 MG 12 hr tablet Take 1 tablet (150 mg total) by mouth daily. 09/30/22  Yes Danford, Orpha Bur D, NP  celecoxib (CELEBREX) 200 MG capsule Take 1 capsule (200 mg total) by mouth 2 (two) times daily. Patient taking differently: Take 200 mg by mouth daily. 04/05/21  Yes Tarry Kos, MD  cetirizine (ZYRTEC) 10 MG tablet Take 10 mg by mouth daily.   Yes [provider]  cyclobenzaprine (FLEXERIL) 10 MG tablet Take 1/2 tablet (5 mg total) by mouth 2 (two) times daily as needed. 09/30/22  Yes   gabapentin (NEURONTIN) 300 MG capsule Take 1 capsule (300 mg total) by mouth 2 (two) times daily. 09/30/22  Yes   HYDROcodone-acetaminophen (NORCO) 5-325 MG tablet Take 1 tablet by mouth 2 (two) times daily as needed. 10/03/22  Yes Cristie Hem, PA-C  ibuprofen (ADVIL) 800 MG tablet Take 800 mg by mouth every 8 (eight) hours as needed (pain).   Yes [provider]  letrozole (FEMARA) 2.5 MG tablet Take 1  tablet (2.5 mg total) by mouth daily. 05/08/22  Yes Rachel Moulds, MD  loratadine (CLARITIN) 10 MG tablet Take 1 tablet (10 mg total) by mouth daily. Patient taking differently: Take 10 mg by mouth daily as needed for allergies. 02/11/17  Yes Magrinat, Valentino Hue, MD  losartan (COZAAR) 50 MG tablet Take 1 tablet (50 mg total) by mouth daily. 09/30/22  Yes   metFORMIN (GLUCOPHAGE) 500 MG tablet Take 1 tablet (500 mg total) by mouth 2 (two) times daily with a meal. 09/30/22  Yes Danford, Katy D, NP  methimazole (TAPAZOLE) 5 MG tablet Take 1 tablet daily Monday through Friday and 2 tablets on Saturday and Sundays as directed 07/14/22  Yes Shamleffer, Konrad Dolores, MD  metoprolol succinate (TOPROL-XL) 50 MG 24 hr tablet Take 1 tablet (50 mg  total) by mouth daily. Patient taking differently: Take 25 mg by mouth daily. 04/24/22  Yes   montelukast (SINGULAIR) 10 MG tablet Take 1 tablet (10 mg total) by mouth at bedtime. 10/31/22  Yes   Multiple Vitamin (MULTIVITAMIN WITH MINERALS) TABS tablet Take 1 tablet by mouth daily.    Yes [provider]  Omega-3 Fatty Acids (FISH OIL) 1000 MG CAPS Take 1,000 mg by mouth 2 (two) times daily.    Yes [provider]  omeprazole (PRILOSEC) 40 MG capsule Take 1 capsule (40 mg total) by mouth daily. 09/15/22  Yes Rachel Moulds, MD  Probiotic Product (PROBIOTIC DAILY PO) Take 1 capsule by mouth 2 (two) times daily.    Yes [provider]  rosuvastatin (CRESTOR) 10 MG tablet Take 1 tablet (10 mg total) by mouth daily. 08/04/22  Yes   spironolactone (ALDACTONE) 25 MG tablet Take 1 tablet (25 mg total) by mouth daily. 09/30/22  Yes   ZINC OXIDE PO Take 1 tablet by mouth 2 (two) times a day.   Yes [provider]  Blood Pressure Monitoring (OMRON 3 SERIES BP MONITOR) DEVI Use as directed 12/18/21     calcium carbonate (TUMS - DOSED IN MG ELEMENTAL CALCIUM) 500 MG chewable tablet Chew 1 tablet by mouth 2 (two) times daily. 07/09/20   [provider]  Insulin Pen Needle 32G X 4 MM MISC Use daily. 06/24/22   Danford, Orpha Bur D, NP  Minoxidil (ROGAINE MENS) 5 % FOAM apply twice daily as directed 02/20/22     ondansetron (ZOFRAN) 4 MG tablet Take 1 tablet (4 mg total) by mouth every 8 (eight) hours as needed for nausea or vomiting. 07/10/22   Danford, Orpha Bur D, NP     Positive ROS: All other systems have been reviewed and were otherwise negative with the exception of those mentioned in the HPI and as above.  Physical Exam: General: Alert, no acute distress Cardiovascular: No pedal edema Respiratory: No cyanosis, no use of accessory musculature GI: abdomen soft Skin: No lesions in the area of chief complaint Neurologic: Sensation intact distally Psychiatric: Patient is competent for consent with normal mood and affect Lymphatic: no lymphedema  MUSCULOSKELETAL: exam stable  Assessment: right knee medial meniscal tear  Plan: Plan for Procedure(s): RIGHT KNEE ARTHROSCOPY, PARTIAL MEDIAL MENISCECTOMY  The risks benefits and alternatives were discussed with the patient including but not limited to the risks of nonoperative treatment, versus surgical intervention including infection, bleeding, nerve injury,  blood clots, cardiopulmonary complications, morbidity, mortality, among others, and they were willing to proceed.   Glee Arvin, MD 11/12/2022 11:34 AM

## 2022-11-12 NOTE — Anesthesia Procedure Notes (Signed)
Procedure Name: LMA Insertion Date/Time: 11/12/2022 12:16 PM  Performed by: Cleda Clarks, CRNAPre-anesthesia Checklist: Patient identified, Emergency Drugs available, Suction available and Patient being monitored Patient Re-evaluated:Patient Re-evaluated prior to induction Oxygen Delivery Method: Circle system utilized Preoxygenation: Pre-oxygenation with 100% oxygen Induction Type: IV induction Ventilation: Mask ventilation without difficulty LMA: LMA inserted LMA Size: 4.0 Number of attempts: 1 Placement Confirmation: positive ETCO2 Tube secured with: Tape Dental Injury: Teeth and Oropharynx as per pre-operative assessment

## 2022-11-12 NOTE — Op Note (Signed)
   Surgery Date: 11/12/2022  PREOPERATIVE DIAGNOSES:  1. Right knee medial meniscus tear 2. Right knee chondromalacia 3. Right knee medial plica  POSTOPERATIVE DIAGNOSES:  same  PROCEDURES PERFORMED:  1. Right knee arthroscopy with medial plica resection 2. Right knee arthroscopy with arthroscopic partial medial meniscectomy 3. Right knee arthroscopy with arthroscopic chondroplasty medial femoral condyle and patella  SURGEON: N. Glee Arvin, M.D.  ASSIST: Starlyn Skeans [place], [place]; necessary for the timely completion of procedure and due to complexity of procedure.  ANESTHESIA:  general  FLUIDS: Per anesthesia record.   ESTIMATED BLOOD LOSS: minimal  DESCRIPTION OF PROCEDURE: Mackenzie [name] is a 52 y.o.-year-old female with above mentioned conditions. Full discussion held regarding risks benefits alternatives and complications related surgical intervention. Conservative care options reviewed. All questions answered.  The patient was identified in the preoperative holding area and the operative extremity was marked. The patient was brought to the operating room and transferred to operating table in a supine position. Satisfactory general anesthesia was induced by anesthesiology.    Standard anterolateral, anteromedial arthroscopy portals were obtained. The anteromedial portal was obtained with a spinal needle for localization under direct visualization with subsequent diagnostic findings.   We first addressed the medial compartment by placing a valgus force on the knee.  Focal area of grade III chondromalacia of the medial femoral condyle in the weightbearing surface.  This measured approximately a centimeter by half a centimeter.  Chondroplasty was performed back to stable margins.  There is no full-thickness chondral defects.  The medial tibial plateau had a small circular area of grade III chondromalacia that was less than 5 mm in diameter.  No unstable chondral surfaces.  Medial  meniscus was then evaluated with a probe.  Radial tear of the meniscal root was encountered.  The posterior horn was probed and evaluated did not demonstrate any translation.  The displaced edge of the radial tear was debrided back to stable margins.  The cruciates were then evaluated and were unremarkable.  Varus force was placed on the knee to evaluate the lateral compartment.  There was some degenerative fraying of the lateral meniscus without any overt tears.  The chondral surfaces were unremarkable.  The knee was then placed into full extension.  There was a thickened medial plica that corresponded to abraded cartilage at the medial femoral condyle.  The medial plica was resected with the oscillating shaver.  The femoral trochlea and the patella both exhibited grade III chondromalacia.  Gentle chondroplasty was performed there.  Gutters were checked for loose bodies.  Excess fluid was removed from the knee joint.  Incisions were closed with interrupted nylon sutures.  Sterile dressings were applied.  Patient tolerated procedure well had no immediate complications.  Suprapatellar pouch and gutters: mild synovitis or debris. Patella chondral surface: Grade 3 Trochlear chondral surface: Grade 3 Patellofemoral tracking: normal Medial meniscus: radial tear posterior horn, meniscal root.  Medial femoral condyle weight bearing surface: 1 x 0.5 cm grade 3 chondromalacia Medial tibial plateau: Grade 0 Anterior cruciate ligament:stable Posterior cruciate ligament:stable Lateral meniscus: degenerative fraying.   Lateral femoral condyle weight bearing surface: Grade 0 Lateral tibial plateau: Grade 0 Thickened medial plica  DISPOSITION: The patient was awakened from general anesthetic, extubated, taken to the recovery room in medically stable condition, no apparent complications. The patient may be weightbearing as tolerated to the operative lower extremity.  Range of motion of right knee as  tolerated.  Mackenzie Reel, MD [facility] 12:49 PM

## 2022-11-12 NOTE — Transfer of Care (Signed)
Immediate Anesthesia Transfer of Care Note  Patient: Mackenzie [name]  Procedure(s) Performed: RIGHT KNEE ARTHROSCOPY, PARTIAL MEDIAL MENISCECTOMY (Right: Knee)  Patient Location: PACU  Anesthesia Type:General  Level of Consciousness: awake, alert , and oriented  Airway & Oxygen Therapy: Patient Spontanous Breathing and Patient connected to face mask oxygen  Post-op Assessment: Report given to RN and Post -op Vital signs reviewed and stable  Post vital signs: Reviewed and stable  Last Vitals:  Vitals Value Taken Time  BP 124/70 11/12/22 1306  Temp    Pulse 67 11/12/22 1308  Resp 14 11/12/22 1308  SpO2 100 % 11/12/22 1308  Vitals shown include unvalidated device data.  Last Pain:  Vitals:   11/12/22 1038  TempSrc: Oral  PainSc: 5       Patients Stated Pain Goal: 7 (11/12/22 1038)  Complications: No notable events documented.

## 2022-11-15 ENCOUNTER — Encounter (HOSPITAL_BASED_OUTPATIENT_CLINIC_OR_DEPARTMENT_OTHER): Payer: Self-pay | Admitting: Orthopaedic Surgery

## 2022-11-20 ENCOUNTER — Ambulatory Visit (INDEPENDENT_AMBULATORY_CARE_PROVIDER_SITE_OTHER): Payer: 59 | Admitting: Physician Assistant

## 2022-11-20 DIAGNOSIS — Z9889 Other specified postprocedural states: Secondary | ICD-10-CM

## 2022-11-20 NOTE — Progress Notes (Signed)
Post-Op Visit Note   Patient: Mackenzie [name]           Date of Birth: [birth year]           MRN: [id number] Visit Date: 11/20/2022 PCP: Dois Davenport, MD   Assessment & Plan:  Chief Complaint:  Chief Complaint  Patient presents with   Right Knee - Routine Post Op   Visit Diagnoses:  1. S/P right knee arthroscopy     Plan: Patient is a pleasant 52 year old female who comes in today 1 week status post right knee arthroscopic debridement medial meniscus, date of surgery 11/12/2022.  She has been doing much better.  Minimal pain which is relieved with Motrin.  She is ambulating with a walker.  Examination of the right knee reveals well-healed surgical portals with nylon sutures in place.  No evidence of complication.  Calves are soft nontender.  She is neurovascularly intact distally.  Today, sutures were removed and Steri-Strips applied.  Intraoperative pictures reviewed.  I provided her with a home exercise program but she would also like to attend formal physical therapy.  Referral has been made.  Continue working from home for the next 2 weeks.  Follow-up in 5 weeks for recheck.  Call with concerns or questions.  Follow-Up Instructions: Return in about 5 weeks (around 12/25/2022).   Orders:  No orders of the defined types were placed in this encounter.  No orders of the defined types were placed in this encounter.   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   Chondromalacia, right knee 11/12/2022   Depression 08/13/2022   BMI 40.0-44.9, adult (HCC) 08/13/2022   Obesity, Beginning BMI 08/13/2022   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   Boil of buttock, Left 12/24/2021   Hyperthyroidism 07/22/2021   Low serum thyroid stimulating hormone (TSH) 06/12/2021   Other fatigue 05/23/2021   Hemoglobin low  05/23/2021   Polyphagia 05/16/2020   Vitamin D deficiency 05/16/2020   At risk for osteoporosis 05/16/2020   Prediabetes 05/01/2020   Elevated blood pressure reading 05/01/2020   At risk for impaired metabolic function 05/01/2020   Breast asymmetry following reconstructive surgery 09/20/2019   Essential hypertension 08/17/2018   Class 3 severe obesity with serious comorbidity and body mass index (BMI) of 40.0 to 44.9 in adult [facility]) 09/17/2017   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   Malignant neoplasm of upper-inner quadrant of left breast in female, 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   Healthcare maintenance 03/25/2017   Malignant 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, estrogen receptor positive (HCC) 01/23/2017   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, left (HCC) 01/23/2017   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   Allergic rhinitis 2012   Anemia 01/27/2017   prior to starting chemotherapy   Breast cancer (HCC) 01/14/2017   Left breast   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, left (HCC) 01/23/2017   Edema, lower extremity    GERD (gastroesophageal reflux disease)    Healthcare maintenance 03/25/2017   Hot flashes 03/2017   Hyperlipidemia 03/20/2016   Morbid obesity with body mass index (BMI) of 40.0 to 49.9 (HCC) 09/17/2017   Pre-diabetes 07/26/2018   Hgb A1C elevated on 07/26/2018, Gestational Diabetes 2012  Seasonal allergies 2012   seasonal allergies causes allergic rhinitis and itchy, dry eyes per pt   Vitamin D deficiency 05/2015    Family History  Problem Relation Age of Onset   Other Mother        d.56 from complications of surgery to remove brain tumor   Diabetes Mother    Hypertension Mother    Stroke Mother    Kidney disease Father    Breast cancer Paternal Grandmother 65       d.60s from breast cancer. Did not have treatment.    Hypertension Other    Diabetes Other    Stroke Other     Past Surgical History:  Procedure Laterality Date   BREAST RECONSTRUCTION WITH PLACEMENT OF TISSUE EXPANDER AND FLEX HD (ACELLULAR HYDRATED DERMIS) Left 08/27/2017   Procedure: LEFT BREAST RECONSTRUCTION WITH PLACEMENT OF TISSUE EXPANDER AND FLEX HD;  Surgeon: Peggye Form, DO;  [facility];  Service: Plastics;  Laterality: Left;   CESAREAN SECTION     x2   KNEE ARTHROSCOPY WITH MEDIAL MENISECTOMY Right 11/12/2022   Procedure: RIGHT KNEE ARTHROSCOPY, PARTIAL MEDIAL MENISCECTOMY;  Surgeon: Tarry Kos, MD;  Location: [facility];  Service: Orthopedics;  Laterality: Right;   LATISSIMUS FLAP TO BREAST Left 11/03/2018   LATISSIMUS FLAP TO BREAST Left 11/03/2018   Procedure: LATISSIMUS FLAP TO LEFT BREAST;  Surgeon: Peggye Form, DO;  [facility];  Service: Plastics;  Laterality: Left;   MASTECTOMY MODIFIED RADICAL Left 08/27/2017   MASTECTOMY MODIFIED RADICAL Left 08/27/2017   Procedure: LEFT MODIFIED RADICAL MASTECTOMY;  Surgeon: Harriette Bouillon, MD;  [facility]: [facility];  Service: General;  Laterality: Left;   MASTOPEXY Right 03/03/2019   Procedure: RIGHT BREAST MASTOPEXY/REDUCTION;  Surgeon: Peggye Form, DO;  Location: [facility];  Service: Plastics;  Laterality: Right;  3 hours, please   PORT-A-CATH REMOVAL Right 08/27/2017   Procedure: REMOVAL PORT-A-CATH RIGHT CHEST;  Surgeon: Harriette Bouillon, MD;  [facility];  Service: General;  Laterality: Right;   PORTACATH PLACEMENT Right 01/28/2017   Procedure: INSERTION PORT-A-CATH WITH ULTRASOUND;  Surgeon: Harriette Bouillon, MD;  Location: [facility];  Service: General;  Laterality: Right;   REDUCTION MAMMAPLASTY Right    REMOVAL OF TISSUE EXPANDER AND PLACEMENT OF IMPLANT Left 03/03/2019   Procedure: REMOVAL OF TISSUE EXPANDER AND PLACEMENT OF EXPANDER;  Surgeon: Peggye Form, DO;  Location: [facility];  Service: Plastics;  Laterality: Left;   REMOVAL OF TISSUE EXPANDER AND PLACEMENT OF IMPLANT Left 05/25/2019   Procedure: LEFT BREAST REMOVAL OF TISSUE EXPANDER AND PLACEMENT OF IMPLANT;  Surgeon: Peggye Form, DO;  Location: [facility];  Service: Plastics;  Laterality: Left;   TISSUE EXPANDER PLACEMENT Left 11/03/2018   Procedure: PLACEMENT OF TISSUE EXPANDER LEFT BREAST;  Surgeon: Peggye Form, DO;  [facility];  Service: Plastics;  Laterality: Left;  Total case time is 3.5 hours   TUBAL LIGATION Bilateral 01/21/2011   Social History   Occupational History   Occupation: Charity fundraiser  Tobacco Use   Smoking status: Never   Smokeless tobacco: Never  Vaping Use   Vaping Use: Never used  Substance and Sexual Activity   Alcohol use: Yes    Comment: social   Drug use: No   Sexual activity: Not on file    Comment: BTL

## 2022-11-21 ENCOUNTER — Telehealth: Payer: Self-pay | Admitting: Orthopaedic Surgery

## 2022-11-21 NOTE — Telephone Encounter (Signed)
Pt called asking is it ok for her to have a massage. Please call pt about this matter at [phone].

## 2022-11-21 NOTE — Telephone Encounter (Signed)
Yes that's fine 

## 2022-11-24 NOTE — Telephone Encounter (Signed)
Called and advised pt. Per her request, a note was also sent to her mychart

## 2022-11-26 ENCOUNTER — Encounter (INDEPENDENT_AMBULATORY_CARE_PROVIDER_SITE_OTHER): Payer: Self-pay | Admitting: Adult Health

## 2022-11-26 ENCOUNTER — Ambulatory Visit (INDEPENDENT_AMBULATORY_CARE_PROVIDER_SITE_OTHER): Payer: 59 | Admitting: Adult Health

## 2022-11-26 VITALS — BP 108/70 | HR 70 | Temp 98.3°F | Ht 65.0 in | Wt 245.0 lb

## 2022-11-26 DIAGNOSIS — R7303 Prediabetes: Secondary | ICD-10-CM

## 2022-11-26 DIAGNOSIS — Z6841 Body Mass Index (BMI) 40.0 and over, adult: Secondary | ICD-10-CM | POA: Diagnosis not present

## 2022-11-26 DIAGNOSIS — Z9889 Other specified postprocedural states: Secondary | ICD-10-CM

## 2022-11-26 DIAGNOSIS — E669 Obesity, unspecified: Secondary | ICD-10-CM

## 2022-11-26 NOTE — Progress Notes (Signed)
WEIGHT SUMMARY AND BIOMETRICS  Vitals Temp: 98.3 F (36.8 C) BP: 108/70 Pulse Rate: 70 SpO2: 99 %   Anthropometric Measurements Height: 5\' 5"  (1.651 m) Weight: 245 lb (111.1 kg) BMI (Calculated): 40.77 Weight at Last Visit: 247lb Weight Lost Since Last Visit: 2lb Weight Gained Since Last Visit: 0 Starting Weight: 267lb Total Weight Loss (lbs): 22 lb (9.979 kg)   Body Composition  Body Fat %: 46.4 % Fat Mass (lbs): 113.8 lbs Muscle Mass (lbs): 124.8 lbs Total Body Water (lbs): 82.8 lbs Visceral Fat Rating : 14   Other Clinical Data Fasting: No Labs: no Today's Visit #: 47 Starting Date: 08/09/19    Chief Complaint:   OBESITY Mackenzie [name] is here to discuss her progress with her obesity treatment plan. She is on the the Category 3 Plan and states she is following her eating plan approximately 70 % of the time. She states she is exercising Home PT movements daily.   Interim History:  Out Patient Surgery- 6/5/20234:  RIGHT KNEE ARTHROSCOPY, PARTIAL MEDIAL MENISCECTOMY   Hunger/appetite-she endorses stable appetite, even after stopping Wegovy 2.4 mg once weekly injection early 2024.  Stress- she is working from home remotely- really enjoying change of work pace.  Exercise-11/12/2022 successful R knee athroscopy- home PT, hopes to start Out Patient PT soon.  Hydration-she estimates to drink 70 oz water/day  Reviewed Bioimpedance Results with pt: Muscle Mass: +2.6 lbs Adipose Mass: -4.6 lbs  Subjective:   1. S/P right knee arthroscopy 11/12/2022  2. Pre-diabetes Lab Results  Component Value Date   HGBA1C 5.8 (H) 09/03/2022   HGBA1C 5.4 01/29/2022   HGBA1C 5.4 12/19/2021   Last use of Wegovy 2.4 therapy- early 2024 She is currently on Metformin 500mg  BID with meals- she denies GI upset. She reports stable appetite since coming off GLP-1 therapy.  Assessment/Plan:   1. S/P right knee arthroscopy Advance activity as tolerated. F/u with Orthopedic  Surgeon and PT as advised.  2. Pre-diabetes Increase protein, limit CHO/Sugar. Advance activity as tolerated. Continue Metformin therapy as directed. If insurance coverage changes- resume GLP-1 or GIP/GLP-1 therapy.  3. Obesity, Current BMI 41.1  Mackenzie [name] is currently in the action stage of change. As such, her goal is to continue with weight loss efforts. She has agreed to the Category 3 Plan.   Exercise goals:  Activity as directed by Ortho/PT  Behavioral modification strategies: increasing lean protein intake, decreasing simple carbohydrates, increasing vegetables, increasing water intake, decreasing eating out, no skipping meals, and planning for success.  Mackenzie [name] has agreed to follow-up with our clinic in 4 weeks. She was informed of the importance of frequent follow-up visits to maximize her success with intensive lifestyle modifications for her multiple health conditions.   Objective:   Blood pressure 108/70, pulse 70, temperature 98.3 F (36.8 C), height 5\' 5"  (1.651 m), weight 245 lb (111.1 kg), last menstrual period 11/28/2016, SpO2 99 %, unknown if currently breastfeeding. Body mass index is 40.77 kg/m.  General: Cooperative, alert, well developed, in no acute distress. HEENT: Conjunctivae and lids unremarkable. Cardiovascular: Regular rhythm.  Lungs: Normal work of breathing. Neurologic: No focal deficits.   Lab Results  Component Value Date   CREATININE 0.79 11/10/2022   BUN 21 (H) 11/10/2022   NA 137 11/10/2022   K 5.2 (H) 11/10/2022   CL 102 11/10/2022   CO2 24 11/10/2022   Lab Results  Component Value Date   ALT 20 09/03/2022   AST 21 09/03/2022   GGT  19 03/01/2021   ALKPHOS 129 (H) 09/03/2022   BILITOT 0.3 09/03/2022   Lab Results  Component Value Date   HGBA1C 5.8 (H) 09/03/2022   HGBA1C 5.4 01/29/2022   HGBA1C 5.4 12/19/2021   HGBA1C 5.4 05/23/2021   HGBA1C 5.4 02/27/2021   Lab Results  Component Value Date   INSULIN 13.1 09/03/2022    INSULIN 21.3 01/29/2022   INSULIN 18.2 01/07/2022   INSULIN 15.1 05/23/2021   INSULIN 15.1 02/27/2021   Lab Results  Component Value Date   TSH 4.94 07/14/2022   Lab Results  Component Value Date   CHOL 202 (H) 11/15/2020   HDL 58 11/15/2020   LDLCALC 135 (H) 11/15/2020   TRIG 52 11/15/2020   CHOLHDL 3.5 11/15/2020   Lab Results  Component Value Date   VD25OH 81.2 09/03/2022   VD25OH 50.2 01/29/2022   VD25OH 49.2 12/19/2021   Lab Results  Component Value Date   WBC 3.8 (L) 07/23/2021   HGB 11.5 (L) 07/23/2021   HCT 35.9 (L) 07/23/2021   MCV 79.6 07/23/2021   PLT 201.0 07/23/2021   No results found for: "IRON", "TIBC", "FERRITIN"  Attestation Statements:   Reviewed by clinician on day of visit: allergies, medications, problem list, medical history, surgical history, family history, social history, and previous encounter notes.  Time spent on visit including pre-visit chart review and post-visit care and charting was 28 minutes.   I have reviewed the above documentation for accuracy and completeness, and I agree with the above. -  Dalayah Deahl d. Bartolo Montanye, NP-C

## 2022-11-28 ENCOUNTER — Telehealth (HOSPITAL_BASED_OUTPATIENT_CLINIC_OR_DEPARTMENT_OTHER): Payer: Self-pay

## 2022-11-28 DIAGNOSIS — R7301 Impaired fasting glucose: Secondary | ICD-10-CM | POA: Diagnosis not present

## 2022-11-28 DIAGNOSIS — E0590 Thyrotoxicosis, unspecified without thyrotoxic crisis or storm: Secondary | ICD-10-CM | POA: Diagnosis not present

## 2022-11-28 DIAGNOSIS — Z0000 Encounter for general adult medical examination without abnormal findings: Secondary | ICD-10-CM | POA: Diagnosis not present

## 2022-11-28 DIAGNOSIS — Z0001 Encounter for general adult medical examination with abnormal findings: Secondary | ICD-10-CM | POA: Diagnosis not present

## 2022-11-28 DIAGNOSIS — E785 Hyperlipidemia, unspecified: Secondary | ICD-10-CM | POA: Diagnosis not present

## 2022-11-28 DIAGNOSIS — E559 Vitamin D deficiency, unspecified: Secondary | ICD-10-CM | POA: Diagnosis not present

## 2022-11-28 DIAGNOSIS — I10 Essential (primary) hypertension: Secondary | ICD-10-CM | POA: Diagnosis not present

## 2022-11-28 NOTE — Telephone Encounter (Signed)
Pt returned call to [facility] to schedule therapy. She said her best cb number is [phone]

## 2022-12-01 ENCOUNTER — Other Ambulatory Visit (HOSPITAL_COMMUNITY): Payer: Self-pay

## 2022-12-02 ENCOUNTER — Other Ambulatory Visit: Payer: Self-pay

## 2022-12-02 ENCOUNTER — Other Ambulatory Visit (HOSPITAL_COMMUNITY): Payer: Self-pay

## 2022-12-02 MED ORDER — ROSUVASTATIN CALCIUM 10 MG PO TABS
10.0000 mg | ORAL_TABLET | Freq: Every day | ORAL | 0 refills | Status: DC
  Filled 2022-12-02: qty 90, 90d supply, fill #0

## 2022-12-02 NOTE — Therapy (Signed)
OUTPATIENT PHYSICAL THERAPY  EVALUATION   Patient Name: Mackenzie [name] MRN: [id number] DOB:05-08-1971, 52 y.o., female Today's Date: 12/03/2022  END OF SESSION:  PT End of Session - 12/03/22 0832     Visit Number 1    Number of Visits 20    Date for PT Re-Evaluation 02/11/23    Authorization Type [facility] [employer], deductible met    Progress Note Due on Visit 10    PT Start Time 0835    PT Stop Time 0909    PT Time Calculation (min) 34 min    Activity Tolerance Patient tolerated treatment well    Behavior During Therapy [facility] for tasks assessed/performed             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   Allergic rhinitis 2012   Anemia 01/27/2017   prior to starting chemotherapy   Breast cancer (HCC) 01/14/2017   Left breast   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, left (HCC) 01/23/2017   Edema, lower extremity    GERD (gastroesophageal reflux disease)    Healthcare maintenance 03/25/2017   Hot flashes 03/2017   Hyperlipidemia 03/20/2016   Morbid obesity with body mass index (BMI) of 40.0 to 49.9 (HCC) 09/17/2017   Pre-diabetes 07/26/2018   Hgb A1C elevated on 07/26/2018, Gestational Diabetes 2012   Seasonal allergies 2012   seasonal allergies causes allergic rhinitis and itchy, dry eyes per pt   Vitamin D deficiency 05/2015   Past Surgical History:  Procedure Laterality Date   BREAST RECONSTRUCTION WITH PLACEMENT OF TISSUE EXPANDER AND FLEX HD (ACELLULAR HYDRATED DERMIS) Left 08/27/2017   Procedure: LEFT BREAST RECONSTRUCTION WITH PLACEMENT OF TISSUE EXPANDER AND FLEX HD;  Surgeon: Peggye Form, DO;  Location: [facility];  Service: Plastics;  Laterality: Left;   CESAREAN SECTION     x2   KNEE ARTHROSCOPY WITH MEDIAL MENISECTOMY Right 11/12/2022   Procedure: RIGHT KNEE ARTHROSCOPY, PARTIAL MEDIAL MENISCECTOMY;  Surgeon: Tarry Kos, MD;  Location: [facility];   Service: Orthopedics;  Laterality: Right;   LATISSIMUS FLAP TO BREAST Left 11/03/2018   LATISSIMUS FLAP TO BREAST Left 11/03/2018   Procedure: LATISSIMUS FLAP TO LEFT BREAST;  Surgeon: Peggye Form, DO;  Location: [facility];  Service: Plastics;  Laterality: Left;   MASTECTOMY MODIFIED RADICAL Left 08/27/2017   MASTECTOMY MODIFIED RADICAL Left 08/27/2017   Procedure: LEFT MODIFIED RADICAL MASTECTOMY;  Surgeon: Harriette Bouillon, MD;  Location: [facility];  Service: General;  Laterality: Left;   MASTOPEXY Right 03/03/2019   Procedure: RIGHT BREAST MASTOPEXY/REDUCTION;  Surgeon: Peggye Form, DO;  Location: [facility];  Service: Plastics;  Laterality: Right;  3 hours, please   PORT-A-CATH REMOVAL Right 08/27/2017   Procedure: REMOVAL PORT-A-CATH RIGHT CHEST;  Surgeon: Harriette Bouillon, MD;  Location: [facility];  Service: General;  Laterality: Right;   PORTACATH PLACEMENT Right 01/28/2017   Procedure: INSERTION PORT-A-CATH WITH ULTRASOUND;  Surgeon: Harriette Bouillon, MD;  Location: [facility];  Service: General;  Laterality: Right;   REDUCTION MAMMAPLASTY Right    REMOVAL OF TISSUE EXPANDER AND PLACEMENT OF IMPLANT Left 03/03/2019   Procedure: REMOVAL OF TISSUE EXPANDER AND PLACEMENT OF EXPANDER;  Surgeon: Peggye Form, DO;  Location: [facility];  Service: Plastics;  Laterality: Left;   REMOVAL OF TISSUE EXPANDER AND PLACEMENT OF IMPLANT Left 05/25/2019   Procedure: LEFT BREAST REMOVAL OF TISSUE  EXPANDER AND PLACEMENT OF IMPLANT;  Surgeon: Peggye Form, DO;  Location: [facility];  Service: Plastics;  Laterality: Left;   TISSUE EXPANDER PLACEMENT Left 11/03/2018   Procedure: PLACEMENT OF TISSUE EXPANDER LEFT BREAST;  Surgeon: Peggye Form, DO;  Location: [facility];  Service: Plastics;  Laterality: Left;  Total case time is 3.5 hours   TUBAL LIGATION Bilateral 01/21/2011   Patient Active Problem List   Diagnosis Date Noted    Acute meniscal tear, medial, right, initial encounter 11/12/2022   Plica syndrome of right knee 11/12/2022   Chondromalacia, right knee 11/12/2022   Depression 08/13/2022   BMI 40.0-44.9, adult (HCC) 08/13/2022   Obesity, Beginning BMI 08/13/2022   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   Boil of buttock, Left 12/24/2021   Hyperthyroidism 07/22/2021   Low serum thyroid stimulating hormone (TSH) 06/12/2021   Other fatigue 05/23/2021   Hemoglobin low 05/23/2021   Polyphagia 05/16/2020   Vitamin D deficiency 05/16/2020   At risk for osteoporosis 05/16/2020   Prediabetes 05/01/2020   Elevated blood pressure reading 05/01/2020   At risk for impaired metabolic function 05/01/2020   Breast asymmetry following reconstructive surgery 09/20/2019   Essential hypertension 08/17/2018   Class 3 severe obesity with serious comorbidity and body mass index (BMI) of 40.0 to 44.9 in adult [facility]) 09/17/2017   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   Malignant neoplasm of upper-inner quadrant of left breast in female, 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 MD  REFERRING PROVIDER: Cristie Hem, PA-C  REFERRING DIAG: [phone] (ICD-10-CM) - S/P right knee arthroscopy  THERAPY DIAG:  Chronic pain of right knee  Muscle weakness (generalized)  Difficulty in walking, not elsewhere classified  Localized edema  Rationale for Evaluation and Treatment: Rehabilitation  ONSET DATE: 11/12/2022 Surgery  SUBJECTIVE:   SUBJECTIVE STATEMENT: Rt knee arthroscopy 11/12/2022 with partial medial menisectomy.  She reported slowly getting better.  Pt arrived with cane, using it most the time.  Sometimes walker at times.  PLOF was independent  walking.   She reported pain has been "pretty good"  but can increase in WB activity.   Reported static positioning tightness, some trouble sleeping due to symptoms.   PERTINENT HISTORY: History of Lt breast cancer, GERD, hyperlipidemia,   PAIN:  NPRS scale: at worst 7/10, at current 1-2/10 Pain location: Rt knee  Pain description: tightness, throbbing.  Aggravating factors: increased WB activity, static positioning  Relieving factors: OTC medicine  PRECAUTIONS: None  WEIGHT BEARING RESTRICTIONS: No  FALLS:  Has patient fallen in last 6 months? No  LIVING ENVIRONMENT: Lives with: lives with their family Lives in: House/apartment Stairs: bedroom on 1st floor.  Enter house:  2-3 without hand rails.  Has following equipment at home: FWW, [facility]  OCCUPATION: RN - management with desk, on floor at times.    PLOF: Independent ,  [employer] leader, exercise walking   PATIENT GOALS: Reduce pain, walk independently   OBJECTIVE:   PATIENT SURVEYS:  12/03/2022 FOTO intake: 52   predicted:  62  COGNITION: 12/03/2022 Overall cognitive status: WFL    SENSATION: 12/03/2022 WFL  EDEMA:  12/03/2022 Localized edema.   MUSCLE LENGTH: 12/03/2022 No specific testing today  POSTURE:  12/03/2022 Unremarkable  PALPATION: 12/03/2022  Anterior inferior knee.   LOWER EXTREMITY ROM:   ROM Right 12/03/2022 Left 12/03/2022  Hip flexion    Hip extension    Hip abduction    Hip adduction    Hip internal rotation    Hip external rotation    Knee flexion 107 AROM in supine heel slide with pain noted 114 AROM in supine heel slide  Knee extension  -4 AROM in seated LAQ 0 AROM in seated LAQ  Ankle dorsiflexion    Ankle plantarflexion    Ankle inversion    Ankle eversion     (Blank rows = not tested)  LOWER EXTREMITY MMT:  MMT Right 12/03/2022 Left 12/03/2022  Hip flexion 5/5 5/5  Hip extension    Hip abduction    Hip adduction    Hip internal rotation    Hip external rotation     Knee flexion    Knee extension 5/5 42, 42.1 lbs  5/5 68, 61.4 lbs  Ankle dorsiflexion    Ankle plantarflexion    Ankle inversion    Ankle eversion     (Blank rows = not tested)  LOWER EXTREMITY SPECIAL TESTS:  12/03/2022 No specific testing  FUNCTIONAL TESTS:  12/03/2022 18 inch chair transfer: without hands on 1st try with weight shift to Lt.  Lt SLS: 8 seconds Rt SLS: < 3 seconds  GAIT: 12/03/2022 Mod Independent with cane in Lt UE, reduced stance on Rt LE.                                                                                                                                                                         TODAY'S TREATMENT                                                                          DATE:  12/03/2022 Therex:    HEP instruction/performance c cues for techniques, handout provided.  Trial set performed of each for comprehension and symptom assessment.  See below for exercise list Nustep Lvl 5 8 mins for ROM  PATIENT EDUCATION:  12/03/2022 Education details: HEP, POC Person educated: Patient Education method: Programmer, multimedia, Demonstration, Verbal cues, and Handouts Education comprehension: verbalized understanding, returned demonstration, and verbal cues required  HOME EXERCISE PROGRAM: Access Code: [email or web address] URL: [email or web address] Date: 12/03/2022 Prepared by: Chyrel Masson  Exercises - Supine Heel Slide  - 1-2 x daily - 7 x weekly - 1 sets - 10 reps - 2 hold - Supine  Quadricep Sets  - 1-2 x daily - 7 x weekly - 1 sets - 10 reps - 5 hold - Seated Long Arc Quad  - 3-5 x daily - 7 x weekly - 1-2 sets - 10 reps - 2 hold - Seated Quad Set  - 3-5 x daily - 7 x weekly - 1 sets - 10 reps - 5 hold - Seated Straight Leg Heel Taps  - 1-2 x daily - 7 x weekly - 3 sets - 10 reps - Sit to Stand  - 3 x daily - 7 x weekly - 1 sets - 10 reps  ASSESSMENT:  CLINICAL IMPRESSION: Patient is a 52 y.o. who comes to clinic with complaints of Rt  knee pain s/p partial medial menisectomy with mobility, strength and movement coordination deficits that impair their ability to perform usual daily and recreational functional activities without increase difficulty/symptoms at this time.  Patient to benefit from skilled PT services to address impairments and limitations to improve to previous level of function without restriction secondary to condition.   OBJECTIVE IMPAIRMENTS: Abnormal gait, decreased activity tolerance, decreased balance, decreased coordination, decreased endurance, decreased mobility, difficulty walking, decreased ROM, decreased strength, increased edema, increased fascial restrictions, impaired perceived functional ability, impaired flexibility, improper body mechanics, and pain.   ACTIVITY LIMITATIONS: carrying, lifting, bending, sitting, standing, squatting, sleeping, stairs, transfers, and locomotion level  PARTICIPATION LIMITATIONS: meal prep, cleaning, interpersonal relationship, driving, shopping, community activity, and occupation  PERSONAL FACTORS:  History of Lt breast cancer, GERD, hyperlipidemia,   are also affecting patient's functional outcome.   REHAB POTENTIAL: Good  CLINICAL DECISION MAKING: Stable/uncomplicated  EVALUATION COMPLEXITY: Low   GOALS: Goals reviewed with patient? Yes  SHORT TERM GOALS: (target date for Short term goals are 3 weeks  12/24/2022)   1.  Patient will demonstrate independent use of home exercise program to maintain progress from in clinic treatments.  Goal status: New  LONG TERM GOALS: (target dates for all long term goals are 10 weeks  02/11/2023 )   1. Patient will demonstrate/report pain at worst less than or equal to 2/10 to facilitate minimal limitation in daily activity secondary to pain symptoms.  Goal status: New   2. Patient will demonstrate independent use of home exercise program to facilitate ability to maintain/progress functional gains from skilled physical  therapy services.  Goal status: New   3. Patient will demonstrate FOTO outcome > or = 62 % to indicate reduced disability due to condition.  Goal status: New   4.  Patient will demonstrate Rt LE dynamometry testing with 10 % of Lt for knee extension to faciltiate usual transfers, stairs, squatting at PLOF for daily life.   Goal status: New   5.  Patient will demonstrate independent ambulation community distances > 500 ft  Goal status: New   6.  Patient will demonstrate ascending/descending stairs reciprocally s UE assist for community integration.   Goal status: New   7.  Patient will demonstrate return to work at [name].  Goal Status: New   PLAN:  PT FREQUENCY: 1-2x/week  PT DURATION: 10 weeks  PLANNED INTERVENTIONS: Therapeutic exercises, Therapeutic activity, Neuro Muscular re-education, Balance training, Gait training, Patient/Family education, Joint mobilization, Stair training, DME instructions, Dry Needling, Electrical stimulation, Traction, Cryotherapy, vasopneumatic deviceMoist heat, Taping, Ultrasound, Ionotophoresis 4mg /ml Dexamethasone, and aquatic therapy, Manual therapy.  All included unless contraindicated  PLAN FOR NEXT SESSION: Review HEP knowledge/results.  Progressive quad strengthening WB/NWB, early balance improvements.    Chyrel Masson, PT, DPT,  OCS, ATC 12/03/22  9:12 AM

## 2022-12-03 ENCOUNTER — Ambulatory Visit (INDEPENDENT_AMBULATORY_CARE_PROVIDER_SITE_OTHER): Payer: 59 | Admitting: Rehabilitative and Restorative Service Providers"

## 2022-12-03 ENCOUNTER — Other Ambulatory Visit: Payer: Self-pay

## 2022-12-03 ENCOUNTER — Encounter: Payer: Self-pay | Admitting: Rehabilitative and Restorative Service Providers"

## 2022-12-03 DIAGNOSIS — R262 Difficulty in walking, not elsewhere classified: Secondary | ICD-10-CM | POA: Diagnosis not present

## 2022-12-03 DIAGNOSIS — M25561 Pain in right knee: Secondary | ICD-10-CM | POA: Diagnosis not present

## 2022-12-03 DIAGNOSIS — G8929 Other chronic pain: Secondary | ICD-10-CM | POA: Diagnosis not present

## 2022-12-03 DIAGNOSIS — M6281 Muscle weakness (generalized): Secondary | ICD-10-CM | POA: Diagnosis not present

## 2022-12-03 DIAGNOSIS — R600 Localized edema: Secondary | ICD-10-CM

## 2022-12-04 ENCOUNTER — Encounter: Payer: Self-pay | Admitting: Physical Therapy

## 2022-12-04 ENCOUNTER — Ambulatory Visit (INDEPENDENT_AMBULATORY_CARE_PROVIDER_SITE_OTHER): Payer: 59 | Admitting: Physical Therapy

## 2022-12-04 DIAGNOSIS — R600 Localized edema: Secondary | ICD-10-CM

## 2022-12-04 DIAGNOSIS — G8929 Other chronic pain: Secondary | ICD-10-CM

## 2022-12-04 DIAGNOSIS — M6281 Muscle weakness (generalized): Secondary | ICD-10-CM

## 2022-12-04 DIAGNOSIS — R262 Difficulty in walking, not elsewhere classified: Secondary | ICD-10-CM

## 2022-12-04 DIAGNOSIS — M25561 Pain in right knee: Secondary | ICD-10-CM | POA: Diagnosis not present

## 2022-12-04 NOTE — Therapy (Signed)
OUTPATIENT PHYSICAL THERAPY TREATMENT NOTE   Patient Name: Mackenzie [name] MRN: [id number] DOB:06-Aug-1970, 52 y.o., female Today's Date: 12/04/2022  END OF SESSION:  PT End of Session - 12/04/22 0843     Visit Number 2    Number of Visits 20    Date for PT Re-Evaluation 02/11/23    Authorization Type [facility] [employer], deductible met    Progress Note Due on Visit 10    PT Start Time 0845    PT Stop Time 0925    PT Time Calculation (min) 40 min    Activity Tolerance Patient tolerated treatment well;No increased pain    Behavior During Therapy [facility] for tasks assessed/performed              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   Allergic rhinitis 2012   Anemia 01/27/2017   prior to starting chemotherapy   Breast cancer (HCC) 01/14/2017   Left breast   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, left (HCC) 01/23/2017   Edema, lower extremity    GERD (gastroesophageal reflux disease)    Healthcare maintenance 03/25/2017   Hot flashes 03/2017   Hyperlipidemia 03/20/2016   Morbid obesity with body mass index (BMI) of 40.0 to 49.9 (HCC) 09/17/2017   Pre-diabetes 07/26/2018   Hgb A1C elevated on 07/26/2018, Gestational Diabetes 2012   Seasonal allergies 2012   seasonal allergies causes allergic rhinitis and itchy, dry eyes per pt   Vitamin D deficiency 05/2015   Past Surgical History:  Procedure Laterality Date   BREAST RECONSTRUCTION WITH PLACEMENT OF TISSUE EXPANDER AND FLEX HD (ACELLULAR HYDRATED DERMIS) Left 08/27/2017   Procedure: LEFT BREAST RECONSTRUCTION WITH PLACEMENT OF TISSUE EXPANDER AND FLEX HD;  Surgeon: Peggye Form, DO;  Location: [facility];  Service: Plastics;  Laterality: Left;   CESAREAN SECTION     x2   KNEE ARTHROSCOPY WITH MEDIAL MENISECTOMY Right 11/12/2022   Procedure: RIGHT KNEE ARTHROSCOPY, PARTIAL MEDIAL MENISCECTOMY;  Surgeon: Tarry Kos, MD;  Location: [facility];  Service: Orthopedics;  Laterality: Right;   LATISSIMUS FLAP TO BREAST Left 11/03/2018   LATISSIMUS FLAP TO BREAST Left 11/03/2018   Procedure: LATISSIMUS FLAP TO LEFT BREAST;  Surgeon: Peggye Form, DO;  Location: [facility];  Service: Plastics;  Laterality: Left;   MASTECTOMY MODIFIED RADICAL Left 08/27/2017   MASTECTOMY MODIFIED RADICAL Left 08/27/2017   Procedure: LEFT MODIFIED RADICAL MASTECTOMY;  Surgeon: Harriette Bouillon, MD;  Location: [facility];  Service: General;  Laterality: Left;   MASTOPEXY Right 03/03/2019   Procedure: RIGHT BREAST MASTOPEXY/REDUCTION;  Surgeon: Peggye Form, DO;  Location: [facility];  Service: Plastics;  Laterality: Right;  3 hours, please   PORT-A-CATH REMOVAL Right 08/27/2017   Procedure: REMOVAL PORT-A-CATH RIGHT CHEST;  Surgeon: Harriette Bouillon, MD;  Location: [facility];  Service: General;  Laterality: Right;   PORTACATH PLACEMENT Right 01/28/2017   Procedure: INSERTION PORT-A-CATH WITH ULTRASOUND;  Surgeon: Harriette Bouillon, MD;  Location: [facility];  Service: General;  Laterality: Right;   REDUCTION MAMMAPLASTY Right    REMOVAL OF TISSUE EXPANDER AND PLACEMENT OF IMPLANT Left 03/03/2019   Procedure: REMOVAL OF TISSUE EXPANDER AND PLACEMENT OF EXPANDER;  Surgeon: Peggye Form, DO;  Location: [facility];  Service: Plastics;  Laterality: Left;   REMOVAL OF TISSUE EXPANDER AND PLACEMENT OF IMPLANT Left 05/25/2019   Procedure: LEFT BREAST  REMOVAL OF TISSUE EXPANDER AND PLACEMENT OF IMPLANT;  Surgeon: Peggye Form, DO;  Location: [facility];  Service: Plastics;  Laterality: Left;   TISSUE EXPANDER PLACEMENT Left 11/03/2018   Procedure: PLACEMENT OF TISSUE EXPANDER LEFT BREAST;  Surgeon: Peggye Form, DO;  Location: [facility];  Service: Plastics;  Laterality: Left;  Total case time is 3.5 hours   TUBAL LIGATION Bilateral 01/21/2011   Patient Active Problem List   Diagnosis  Date Noted   Acute meniscal tear, medial, right, initial encounter 11/12/2022   Plica syndrome of right knee 11/12/2022   Chondromalacia, right knee 11/12/2022   Depression 08/13/2022   BMI 40.0-44.9, adult (HCC) 08/13/2022   Obesity, Beginning BMI 08/13/2022   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   Boil of buttock, Left 12/24/2021   Hyperthyroidism 07/22/2021   Low serum thyroid stimulating hormone (TSH) 06/12/2021   Other fatigue 05/23/2021   Hemoglobin low 05/23/2021   Polyphagia 05/16/2020   Vitamin D deficiency 05/16/2020   At risk for osteoporosis 05/16/2020   Prediabetes 05/01/2020   Elevated blood pressure reading 05/01/2020   At risk for impaired metabolic function 05/01/2020   Breast asymmetry following reconstructive surgery 09/20/2019   Essential hypertension 08/17/2018   Class 3 severe obesity with serious comorbidity and body mass index (BMI) of 40.0 to 44.9 in adult [facility]) 09/17/2017   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   Malignant neoplasm of upper-inner quadrant of left breast in female, 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 MD  REFERRING PROVIDER: Cristie Hem, PA-C  REFERRING DIAG: [phone] (ICD-10-CM) - S/P right knee arthroscopy  THERAPY DIAG:  Chronic pain of right knee  Muscle weakness (generalized)  Difficulty in walking, not elsewhere classified  Localized edema  Rationale for Evaluation and Treatment: Rehabilitation  ONSET DATE: 11/12/2022 Surgery  SUBJECTIVE:  Per eval - Rt knee arthroscopy 11/12/2022 with partial medial menisectomy.  She reported slowly getting better.  Pt arrived with cane, using it most the time.  Sometimes walker at times.  PLOF was independent  walking.   She reported pain has been "pretty good"  but can increase in WB activity.   Reported static positioning tightness, some trouble sleeping due to symptoms.   SUBJECTIVE STATEMENT: 12/04/2022 Pt arrives w/ 3/10 pain at present no new updates since yesterday. States she is going to [place] this weekend and will likely be doing a lot of walking but is bringing a rollator  PERTINENT HISTORY: History of Lt breast cancer, GERD, hyperlipidemia,   PAIN:  NPRS scale: at worst 7/10, at current 1-2/10 Pain location: Rt knee  Pain description: tightness, throbbing.  Aggravating factors: increased WB activity, static positioning  Relieving factors: OTC medicine  PRECAUTIONS: None  WEIGHT BEARING RESTRICTIONS: No  FALLS:  Has patient fallen in last 6 months? No  LIVING ENVIRONMENT: Lives with: lives with their family Lives in: House/apartment Stairs: bedroom on 1st floor.  Enter house:  2-3 without hand rails.  Has following equipment at home: FWW, [facility]  OCCUPATION: RN - management with desk, on floor at times.    PLOF: Independent ,  [employer] leader, exercise walking   PATIENT GOALS: Reduce pain, walk independently   OBJECTIVE: (objective measures completed at initial evaluation unless otherwise dated)  PATIENT SURVEYS:  12/03/2022 FOTO intake: 52   predicted:  62  COGNITION: 12/03/2022 Overall cognitive status: WFL    SENSATION: 12/03/2022 WFL  EDEMA:  12/03/2022 Localized edema.   MUSCLE LENGTH: 12/03/2022 No specific testing today  POSTURE:  12/03/2022 Unremarkable  PALPATION: 12/03/2022 Anterior inferior knee.   LOWER EXTREMITY ROM:   ROM Right 12/03/2022 Left 12/03/2022  Hip flexion    Hip extension    Hip abduction    Hip adduction    Hip internal rotation    Hip external rotation    Knee flexion 107 AROM in supine heel slide with pain noted 114 AROM in supine heel slide  Knee extension  -4 AROM in seated LAQ 0 AROM in seated LAQ  Ankle  dorsiflexion    Ankle plantarflexion    Ankle inversion    Ankle eversion     (Blank rows = not tested)  LOWER EXTREMITY MMT:  MMT Right 12/03/2022 Left 12/03/2022  Hip flexion 5/5 5/5  Hip extension    Hip abduction    Hip adduction    Hip internal rotation    Hip external rotation    Knee flexion    Knee extension 5/5 42, 42.1 lbs  5/5 68, 61.4 lbs  Ankle dorsiflexion    Ankle plantarflexion    Ankle inversion    Ankle eversion     (Blank rows = not tested)  LOWER EXTREMITY SPECIAL TESTS:  12/03/2022 No specific testing  FUNCTIONAL TESTS:  12/03/2022 18 inch chair transfer: without hands on 1st try with weight shift to Lt.  Lt SLS: 8 seconds Rt SLS: < 3 seconds  GAIT: 12/03/2022 Mod Independent with cane in Lt UE, reduced stance on Rt LE.                                                                                                                                                                         TODAY'S TREATMENT                                                                           [facility] Adult PT Treatment:                                                DATE: 12/04/22 Therapeutic Exercise: Supine quad set with 5 sec  hold 2x12 cues for quad activation, second set with heel prop for inc ROM  SLR supine 2x6 cues for control and pacing Standing hamstring curls at counter 2x8 BIL cues for pacing and reduced compensations at knee  Standing heel raises 2x10 cues for reduced trunk lean  STS from raised mat no UE support 4x5 cues for improved symmetry of WB  Supine heel slides w/ strap and pillow case to minimize friction, PT assisted, x10 HEP verbal review + education on strategies to maximize adherence w/ travel this weekend   DATE:  12/03/2022 Therex:    HEP instruction/performance c cues for techniques, handout provided.  Trial set performed of each for comprehension and symptom assessment.  See below for exercise list Nustep Lvl 5 8 mins for ROM  PATIENT  EDUCATION:  12/03/2022 Education details: HEP, POC Person educated: Patient Education method: Programmer, multimedia, Demonstration, Verbal cues, and Handouts Education comprehension: verbalized understanding, returned demonstration, and verbal cues required  HOME EXERCISE PROGRAM: Access Code: [email or web address] URL: [email or web address] Date: 12/03/2022 Prepared by: Chyrel Masson  Exercises - Supine Heel Slide  - 1-2 x daily - 7 x weekly - 1 sets - 10 reps - 2 hold - Supine Quadricep Sets  - 1-2 x daily - 7 x weekly - 1 sets - 10 reps - 5 hold - Seated Long Arc Quad  - 3-5 x daily - 7 x weekly - 1-2 sets - 10 reps - 2 hold - Seated Quad Set  - 3-5 x daily - 7 x weekly - 1 sets - 10 reps - 5 hold - Seated Straight Leg Heel Taps  - 1-2 x daily - 7 x weekly - 3 sets - 10 reps - Sit to Stand  - 3 x daily - 7 x weekly - 1 sets - 10 reps  ASSESSMENT:  CLINICAL IMPRESSION: 12/04/2022 Pt arrives w/ 3/10 pain at present, no issues after yesterday. Continues to demo limitations in ROM/strength and altered gait w/ SPC as expected post op, tolerates interventions well on this date with cues as above. No adverse events, departs with report of 2-3/10 pain. Recommend continuing along current POC in order to address relevant deficits and improve functional tolerance. Pt departs today's session in no acute distress, all voiced questions/concerns addressed appropriately from PT perspective.    Per eval - Patient is a 52 y.o. who comes to clinic with complaints of Rt knee pain s/p partial medial menisectomy with mobility, strength and movement coordination deficits that impair their ability to perform usual daily and recreational functional activities without increase difficulty/symptoms at this time.  Patient to benefit from skilled PT services to address impairments and limitations to improve to previous level of function without restriction secondary to condition.   OBJECTIVE IMPAIRMENTS: Abnormal gait,  decreased activity tolerance, decreased balance, decreased coordination, decreased endurance, decreased mobility, difficulty walking, decreased ROM, decreased strength, increased edema, increased fascial restrictions, impaired perceived functional ability, impaired flexibility, improper body mechanics, and pain.   ACTIVITY LIMITATIONS: carrying, lifting, bending, sitting, standing, squatting, sleeping, stairs, transfers, and locomotion level  PARTICIPATION LIMITATIONS: meal prep, cleaning, interpersonal relationship, driving, shopping, community activity, and occupation  PERSONAL FACTORS:  History of Lt breast cancer, GERD, hyperlipidemia,   are also affecting patient's functional outcome.   REHAB POTENTIAL: Good  CLINICAL DECISION MAKING: Stable/uncomplicated  EVALUATION COMPLEXITY: Low   GOALS: Goals reviewed with patient? Yes  SHORT TERM GOALS: (target date for Short term goals are 3 weeks  12/24/2022)   1.  Patient will demonstrate  independent use of home exercise program to maintain progress from in clinic treatments.  Goal status: New  LONG TERM GOALS: (target dates for all long term goals are 10 weeks  02/11/2023 )   1. Patient will demonstrate/report pain at worst less than or equal to 2/10 to facilitate minimal limitation in daily activity secondary to pain symptoms.  Goal status: New   2. Patient will demonstrate independent use of home exercise program to facilitate ability to maintain/progress functional gains from skilled physical therapy services.  Goal status: New   3. Patient will demonstrate FOTO outcome > or = 62 % to indicate reduced disability due to condition.  Goal status: New   4.  Patient will demonstrate Rt LE dynamometry testing with 10 % of Lt for knee extension to faciltiate usual transfers, stairs, squatting at PLOF for daily life.   Goal status: New   5.  Patient will demonstrate independent ambulation community distances > 500 ft  Goal status:  New   6.  Patient will demonstrate ascending/descending stairs reciprocally s UE assist for community integration.   Goal status: New   7.  Patient will demonstrate return to work at [name].  Goal Status: New   PLAN:  PT FREQUENCY: 1-2x/week  PT DURATION: 10 weeks  PLANNED INTERVENTIONS: Therapeutic exercises, Therapeutic activity, Neuro Muscular re-education, Balance training, Gait training, Patient/Family education, Joint mobilization, Stair training, DME instructions, Dry Needling, Electrical stimulation, Traction, Cryotherapy, vasopneumatic deviceMoist heat, Taping, Ultrasound, Ionotophoresis 4mg /ml Dexamethasone, and aquatic therapy, Manual therapy.  All included unless contraindicated  PLAN FOR NEXT SESSION: Review HEP knowledge/results.  Progressive quad strengthening WB/NWB, early balance improvements.     Ashley Murrain PT, DPT 12/04/2022 9:27 AM

## 2022-12-05 NOTE — Therapy (Signed)
OUTPATIENT PHYSICAL THERAPY TREATMENT NOTE   Patient Name: OREATHA SUMMERVILLE MRN: [id number] DOB:[birth year], 52 y.o., female Today's Date: 12/08/2022  END OF SESSION:  PT End of Session - 12/08/22 0806     Visit Number 3    Number of Visits 20    Date for PT Re-Evaluation 02/11/23    Authorization Type [facility] [employer], deductible met    Progress Note Due on Visit 10    PT Start Time 0806   late check in   PT Stop Time 0845    PT Time Calculation (min) 39 min    Activity Tolerance Patient tolerated treatment well;No increased pain    Behavior During Therapy [facility] for tasks assessed/performed               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   Allergic rhinitis 2012   Anemia 01/27/2017   prior to starting chemotherapy   Breast cancer (HCC) 01/14/2017   Left breast   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, left (HCC) 01/23/2017   Edema, lower extremity    GERD (gastroesophageal reflux disease)    Healthcare maintenance 03/25/2017   Hot flashes 03/2017   Hyperlipidemia 03/20/2016   Morbid obesity with body mass index (BMI) of 40.0 to 49.9 (HCC) 09/17/2017   Pre-diabetes 07/26/2018   Hgb A1C elevated on 07/26/2018, Gestational Diabetes 2012   Seasonal allergies 2012   seasonal allergies causes allergic rhinitis and itchy, dry eyes per pt   Vitamin D deficiency 05/2015   Past Surgical History:  Procedure Laterality Date   BREAST RECONSTRUCTION WITH PLACEMENT OF TISSUE EXPANDER AND FLEX HD (ACELLULAR HYDRATED DERMIS) Left 08/27/2017   Procedure: LEFT BREAST RECONSTRUCTION WITH PLACEMENT OF TISSUE EXPANDER AND FLEX HD;  Surgeon: Peggye Form, DO;  Location: [facility];  Service: Plastics;  Laterality: Left;   CESAREAN SECTION     x2   KNEE ARTHROSCOPY WITH MEDIAL MENISECTOMY Right 11/12/2022   Procedure: RIGHT KNEE ARTHROSCOPY, PARTIAL MEDIAL MENISCECTOMY;  Surgeon: Tarry Kos, MD;   Location: [facility];  Service: Orthopedics;  Laterality: Right;   LATISSIMUS FLAP TO BREAST Left 11/03/2018   LATISSIMUS FLAP TO BREAST Left 11/03/2018   Procedure: LATISSIMUS FLAP TO LEFT BREAST;  Surgeon: Peggye Form, DO;  Location: [facility];  Service: Plastics;  Laterality: Left;   MASTECTOMY MODIFIED RADICAL Left 08/27/2017   MASTECTOMY MODIFIED RADICAL Left 08/27/2017   Procedure: LEFT MODIFIED RADICAL MASTECTOMY;  Surgeon: Harriette Bouillon, MD;  Location: [facility];  Service: General;  Laterality: Left;   MASTOPEXY Right 03/03/2019   Procedure: RIGHT BREAST MASTOPEXY/REDUCTION;  Surgeon: Peggye Form, DO;  Location: [facility];  Service: Plastics;  Laterality: Right;  3 hours, please   PORT-A-CATH REMOVAL Right 08/27/2017   Procedure: REMOVAL PORT-A-CATH RIGHT CHEST;  Surgeon: Harriette Bouillon, MD;  Location: [facility];  Service: General;  Laterality: Right;   PORTACATH PLACEMENT Right 01/28/2017   Procedure: INSERTION PORT-A-CATH WITH ULTRASOUND;  Surgeon: Harriette Bouillon, MD;  Location: [facility];  Service: General;  Laterality: Right;   REDUCTION MAMMAPLASTY Right    REMOVAL OF TISSUE EXPANDER AND PLACEMENT OF IMPLANT Left 03/03/2019   Procedure: REMOVAL OF TISSUE EXPANDER AND PLACEMENT OF EXPANDER;  Surgeon: Peggye Form, DO;  Location: [facility];  Service: Plastics;  Laterality: Left;   REMOVAL OF TISSUE EXPANDER AND PLACEMENT OF IMPLANT Left 05/25/2019  Procedure: LEFT BREAST REMOVAL OF TISSUE EXPANDER AND PLACEMENT OF IMPLANT;  Surgeon: Peggye Form, DO;  Location: [facility];  Service: Plastics;  Laterality: Left;   TISSUE EXPANDER PLACEMENT Left 11/03/2018   Procedure: PLACEMENT OF TISSUE EXPANDER LEFT BREAST;  Surgeon: Peggye Form, DO;  Location: [facility];  Service: Plastics;  Laterality: Left;  Total case time is 3.5 hours   TUBAL LIGATION Bilateral 01/21/2011   Patient Active  Problem List   Diagnosis Date Noted   Acute meniscal tear, medial, right, initial encounter 11/12/2022   Plica syndrome of right knee 11/12/2022   Chondromalacia, right knee 11/12/2022   Depression 08/13/2022   BMI 40.0-44.9, adult (HCC) 08/13/2022   Obesity, Beginning BMI 08/13/2022   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   Boil of buttock, Left 12/24/2021   Hyperthyroidism 07/22/2021   Low serum thyroid stimulating hormone (TSH) 06/12/2021   Other fatigue 05/23/2021   Hemoglobin low 05/23/2021   Polyphagia 05/16/2020   Vitamin D deficiency 05/16/2020   At risk for osteoporosis 05/16/2020   Prediabetes 05/01/2020   Elevated blood pressure reading 05/01/2020   At risk for impaired metabolic function 05/01/2020   Breast asymmetry following reconstructive surgery 09/20/2019   Essential hypertension 08/17/2018   Class 3 severe obesity with serious comorbidity and body mass index (BMI) of 40.0 to 44.9 in adult [facility]) 09/17/2017   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   Malignant neoplasm of upper-inner quadrant of left breast in female, 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 MD  REFERRING PROVIDER: Cristie Hem, PA-C  REFERRING DIAG: [phone] (ICD-10-CM) - S/P right knee arthroscopy  THERAPY DIAG:  Chronic pain of right knee  Muscle weakness (generalized)  Difficulty in walking, not elsewhere classified  Localized edema  Rationale for Evaluation and Treatment: Rehabilitation  ONSET DATE: 11/12/2022 Surgery  SUBJECTIVE:  Per eval - Rt knee arthroscopy 11/12/2022 with partial medial menisectomy.  She reported slowly getting better.  Pt arrived with cane, using it most the time.  Sometimes walker at times.   PLOF was independent walking.   She reported pain has been "pretty good"  but can increase in WB activity.   Reported static positioning tightness, some trouble sleeping due to symptoms.   SUBJECTIVE STATEMENT: 12/08/2022 Pt states she is a little more sore/swollen today which she attributes to her weekend travels. 4-5/10 pain at present. Used the rollator which was helpful. Did well after last session   PERTINENT HISTORY: History of Lt breast cancer, GERD, hyperlipidemia,   PAIN:  NPRS scale: at worst 7/10, at current 1-2/10 Pain location: Rt knee  Pain description: tightness, throbbing.  Aggravating factors: increased WB activity, static positioning  Relieving factors: OTC medicine  PRECAUTIONS: None  WEIGHT BEARING RESTRICTIONS: No  FALLS:  Has patient fallen in last 6 months? No  LIVING ENVIRONMENT: Lives with: lives with their family Lives in: House/apartment Stairs: bedroom on 1st floor.  Enter house:  2-3 without hand rails.  Has following equipment at home: FWW, [facility]  OCCUPATION: RN - management with desk, on floor at times.    PLOF: Independent ,  [employer] leader, exercise walking   PATIENT GOALS: Reduce pain, walk independently   OBJECTIVE: (objective measures completed at initial evaluation unless otherwise  dated)   PATIENT SURVEYS:  12/03/2022 FOTO intake: 52   predicted:  62  COGNITION: 12/03/2022 Overall cognitive status: WFL    SENSATION: 12/03/2022 WFL  EDEMA:  12/03/2022 Localized edema.   MUSCLE LENGTH: 12/03/2022 No specific testing today  POSTURE:  12/03/2022 Unremarkable  PALPATION: 12/03/2022 Anterior inferior knee.   LOWER EXTREMITY ROM:   ROM Right 12/03/2022 Left 12/03/2022 Right 12/08/22  Hip flexion     Hip extension     Hip abduction     Hip adduction     Hip internal rotation     Hip external rotation     Knee flexion 107 AROM in supine heel slide with pain noted 114 AROM in supine heel slide AAROM in supine 109 deg mild  pain   Knee extension  -4 AROM in seated LAQ 0 AROM in seated LAQ   Ankle dorsiflexion     Ankle plantarflexion     Ankle inversion     Ankle eversion      (Blank rows = not tested)  LOWER EXTREMITY MMT:  MMT Right 12/03/2022 Left 12/03/2022  Hip flexion 5/5 5/5  Hip extension    Hip abduction    Hip adduction    Hip internal rotation    Hip external rotation    Knee flexion    Knee extension 5/5 42, 42.1 lbs  5/5 68, 61.4 lbs  Ankle dorsiflexion    Ankle plantarflexion    Ankle inversion    Ankle eversion     (Blank rows = not tested)  LOWER EXTREMITY SPECIAL TESTS:  12/03/2022 No specific testing  FUNCTIONAL TESTS:  12/03/2022 18 inch chair transfer: without hands on 1st try with weight shift to Lt.  Lt SLS: 8 seconds Rt SLS: < 3 seconds  GAIT: 12/03/2022 Mod Independent with cane in Lt UE, reduced stance on Rt LE.                                                                                                                                                                         TODAY'S TREATMENT                                                                           [facility] Adult PT Treatment:  DATE: 12/08/22 Therapeutic Exercise: Supine quad set w/ heel prop 2x12 cues for form SLR 2x6 cues for pacing Standing heel raises 2x15 cues for trunk mechanics Standing hip 45 deg kickbacks unresisted, 2x6 BIL with UE support  Seated edge of mat knee flex/ext AROM 2x8 cues for pacing and control  Seated marches x8 BIL cues for breath control and pacing Supine heel slides w/ strap and pillow case x5 Verbal HEP review   [facility] Adult PT Treatment:                                                DATE: 12/04/22 Therapeutic Exercise: Supine quad set with 5 sec hold 2x12 cues for quad activation, second set with heel prop for inc ROM  SLR supine 2x6 cues for control and pacing Standing hamstring curls at counter 2x8 BIL cues for  pacing and reduced compensations at knee  Standing heel raises 2x10 cues for reduced trunk lean  STS from raised mat no UE support 4x5 cues for improved symmetry of WB  Supine heel slides w/ strap and pillow case to minimize friction, PT assisted, x10 HEP verbal review + education on strategies to maximize adherence w/ travel this weekend   DATE:  12/03/2022 Therex:    HEP instruction/performance c cues for techniques, handout provided.  Trial set performed of each for comprehension and symptom assessment.  See below for exercise list Nustep Lvl 5 8 mins for ROM  PATIENT EDUCATION:  Education details: rationale for interventions Person educated: Patient Education method: Explanation, Demonstration, Verbal cues, and Handouts Education comprehension: verbalized understanding, returned demonstration, and verbal cues required  HOME EXERCISE PROGRAM: Access Code: [email or web address] URL: [email or web address] Date: 12/03/2022 Prepared by: Chyrel Masson  Exercises - Supine Heel Slide  - 1-2 x daily - 7 x weekly - 1 sets - 10 reps - 2 hold - Supine Quadricep Sets  - 1-2 x daily - 7 x weekly - 1 sets - 10 reps - 5 hold - Seated Long Arc Quad  - 3-5 x daily - 7 x weekly - 1-2 sets - 10 reps - 2 hold - Seated Quad Set  - 3-5 x daily - 7 x weekly - 1 sets - 10 reps - 5 hold - Seated Straight Leg Heel Taps  - 1-2 x daily - 7 x weekly - 3 sets - 10 reps - Sit to Stand  - 3 x daily - 7 x weekly - 1 sets - 10 reps  ASSESSMENT:  CLINICAL IMPRESSION: 12/08/2022 Pt arrives w/ 4-5/10 pain and soreness she attributes to weekend travels, otherwise doing well and no issues after last session. Given increased activities over the weekend, limited progress for exercises today instead focusing on improving quality of performance and faded feedback with cueing. Tolerates well overall, gradually improving symptoms as session goes on and departs with 3/10 pain. Pt departs today's session in no acute distress,  all voiced questions/concerns addressed appropriately from PT perspective.     Per eval - Patient is a 52 y.o. who comes to clinic with complaints of Rt knee pain s/p partial medial menisectomy with mobility, strength and movement coordination deficits that impair their ability to perform usual daily and recreational functional activities without increase difficulty/symptoms at this time.  Patient to benefit from skilled PT services to address impairments and limitations to improve to previous level of  function without restriction secondary to condition.   OBJECTIVE IMPAIRMENTS: Abnormal gait, decreased activity tolerance, decreased balance, decreased coordination, decreased endurance, decreased mobility, difficulty walking, decreased ROM, decreased strength, increased edema, increased fascial restrictions, impaired perceived functional ability, impaired flexibility, improper body mechanics, and pain.   ACTIVITY LIMITATIONS: carrying, lifting, bending, sitting, standing, squatting, sleeping, stairs, transfers, and locomotion level  PARTICIPATION LIMITATIONS: meal prep, cleaning, interpersonal relationship, driving, shopping, community activity, and occupation  PERSONAL FACTORS:  History of Lt breast cancer, GERD, hyperlipidemia,   are also affecting patient's functional outcome.   REHAB POTENTIAL: Good  CLINICAL DECISION MAKING: Stable/uncomplicated  EVALUATION COMPLEXITY: Low   GOALS: Goals reviewed with patient? Yes  SHORT TERM GOALS: (target date for Short term goals are 3 weeks  12/24/2022)   1.  Patient will demonstrate independent use of home exercise program to maintain progress from in clinic treatments.  Goal status: New  LONG TERM GOALS: (target dates for all long term goals are 10 weeks  02/11/2023 )   1. Patient will demonstrate/report pain at worst less than or equal to 2/10 to facilitate minimal limitation in daily activity secondary to pain symptoms.  Goal status: New    2. Patient will demonstrate independent use of home exercise program to facilitate ability to maintain/progress functional gains from skilled physical therapy services.  Goal status: New   3. Patient will demonstrate FOTO outcome > or = 62 % to indicate reduced disability due to condition.  Goal status: New   4.  Patient will demonstrate Rt LE dynamometry testing with 10 % of Lt for knee extension to faciltiate usual transfers, stairs, squatting at PLOF for daily life.   Goal status: New   5.  Patient will demonstrate independent ambulation community distances > 500 ft  Goal status: New   6.  Patient will demonstrate ascending/descending stairs reciprocally s UE assist for community integration.   Goal status: New   7.  Patient will demonstrate return to work at [name].  Goal Status: New   PLAN:  PT FREQUENCY: 1-2x/week  PT DURATION: 10 weeks  PLANNED INTERVENTIONS: Therapeutic exercises, Therapeutic activity, Neuro Muscular re-education, Balance training, Gait training, Patient/Family education, Joint mobilization, Stair training, DME instructions, Dry Needling, Electrical stimulation, Traction, Cryotherapy, vasopneumatic deviceMoist heat, Taping, Ultrasound, Ionotophoresis 4mg /ml Dexamethasone, and aquatic therapy, Manual therapy.  All included unless contraindicated  PLAN FOR NEXT SESSION: Review HEP knowledge/results.  Progressive quad strengthening WB/NWB, early balance improvements.      Ashley Murrain PT, DPT 12/08/2022 9:00 AM

## 2022-12-08 ENCOUNTER — Other Ambulatory Visit: Payer: Self-pay | Admitting: *Deleted

## 2022-12-08 ENCOUNTER — Ambulatory Visit (INDEPENDENT_AMBULATORY_CARE_PROVIDER_SITE_OTHER): Payer: 59 | Admitting: Physical Therapy

## 2022-12-08 ENCOUNTER — Encounter: Payer: Self-pay | Admitting: Physical Therapy

## 2022-12-08 DIAGNOSIS — G8929 Other chronic pain: Secondary | ICD-10-CM | POA: Diagnosis not present

## 2022-12-08 DIAGNOSIS — M6281 Muscle weakness (generalized): Secondary | ICD-10-CM | POA: Diagnosis not present

## 2022-12-08 DIAGNOSIS — R600 Localized edema: Secondary | ICD-10-CM | POA: Diagnosis not present

## 2022-12-08 DIAGNOSIS — C50212 Malignant neoplasm of upper-inner quadrant of left female breast: Secondary | ICD-10-CM

## 2022-12-08 DIAGNOSIS — M25561 Pain in right knee: Secondary | ICD-10-CM

## 2022-12-08 DIAGNOSIS — R262 Difficulty in walking, not elsewhere classified: Secondary | ICD-10-CM

## 2022-12-10 ENCOUNTER — Ambulatory Visit (INDEPENDENT_AMBULATORY_CARE_PROVIDER_SITE_OTHER): Payer: 59 | Admitting: Rehabilitative and Restorative Service Providers"

## 2022-12-10 ENCOUNTER — Encounter: Payer: Self-pay | Admitting: Rehabilitative and Restorative Service Providers"

## 2022-12-10 DIAGNOSIS — M6281 Muscle weakness (generalized): Secondary | ICD-10-CM

## 2022-12-10 DIAGNOSIS — R600 Localized edema: Secondary | ICD-10-CM

## 2022-12-10 DIAGNOSIS — G8929 Other chronic pain: Secondary | ICD-10-CM | POA: Diagnosis not present

## 2022-12-10 DIAGNOSIS — R262 Difficulty in walking, not elsewhere classified: Secondary | ICD-10-CM | POA: Diagnosis not present

## 2022-12-10 DIAGNOSIS — M25561 Pain in right knee: Secondary | ICD-10-CM | POA: Diagnosis not present

## 2022-12-10 NOTE — Therapy (Signed)
OUTPATIENT PHYSICAL THERAPY TREATMENT NOTE   Patient Name: Mackenzie [name] MRN: [id number] DOB:1970-10-21, 52 y.o., female Today's Date: 12/10/2022  END OF SESSION:  PT End of Session - 12/10/22 1508     Visit Number 4    Number of Visits 20    Date for PT Re-Evaluation 02/11/23    Authorization Type [facility] [employer], deductible met    Progress Note Due on Visit 10    PT Start Time 1508    PT Stop Time 1550    PT Time Calculation (min) 42 min    Activity Tolerance Patient tolerated treatment well    Behavior During Therapy [facility] for tasks assessed/performed                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   Allergic rhinitis 2012   Anemia 01/27/2017   prior to starting chemotherapy   Breast cancer (HCC) 01/14/2017   Left breast   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, left (HCC) 01/23/2017   Edema, lower extremity    GERD (gastroesophageal reflux disease)    Healthcare maintenance 03/25/2017   Hot flashes 03/2017   Hyperlipidemia 03/20/2016   Morbid obesity with body mass index (BMI) of 40.0 to 49.9 (HCC) 09/17/2017   Pre-diabetes 07/26/2018   Hgb A1C elevated on 07/26/2018, Gestational Diabetes 2012   Seasonal allergies 2012   seasonal allergies causes allergic rhinitis and itchy, dry eyes per pt   Vitamin D deficiency 05/2015   Past Surgical History:  Procedure Laterality Date   BREAST RECONSTRUCTION WITH PLACEMENT OF TISSUE EXPANDER AND FLEX HD (ACELLULAR HYDRATED DERMIS) Left 08/27/2017   Procedure: LEFT BREAST RECONSTRUCTION WITH PLACEMENT OF TISSUE EXPANDER AND FLEX HD;  Surgeon: Peggye Form, DO;  [facility];  Service: Plastics;  Laterality: Left;   CESAREAN SECTION     x2   KNEE ARTHROSCOPY WITH MEDIAL MENISECTOMY Right 11/12/2022   Procedure: RIGHT KNEE ARTHROSCOPY, PARTIAL MEDIAL MENISCECTOMY;  Surgeon: Tarry Kos, MD;  Location: [facility];   Service: Orthopedics;  Laterality: Right;   LATISSIMUS FLAP TO BREAST Left 11/03/2018   LATISSIMUS FLAP TO BREAST Left 11/03/2018   Procedure: LATISSIMUS FLAP TO LEFT BREAST;  Surgeon: Peggye Form, DO;  [facility];  Service: Plastics;  Laterality: Left;   MASTECTOMY MODIFIED RADICAL Left 08/27/2017   MASTECTOMY MODIFIED RADICAL Left 08/27/2017   Procedure: LEFT MODIFIED RADICAL MASTECTOMY;  Surgeon: Harriette Bouillon, MD;  [facility];  Service: General;  Laterality: Left;   MASTOPEXY Right 03/03/2019   Procedure: RIGHT BREAST MASTOPEXY/REDUCTION;  Surgeon: Peggye Form, DO;  Location: [facility];  Service: Plastics;  Laterality: Right;  3 hours, please   PORT-A-CATH REMOVAL Right 08/27/2017   Procedure: REMOVAL PORT-A-CATH RIGHT CHEST;  Surgeon: Harriette Bouillon, MD;  [facility];  Service: General;  Laterality: Right;   PORTACATH PLACEMENT Right 01/28/2017   Procedure: INSERTION PORT-A-CATH WITH ULTRASOUND;  Surgeon: Harriette Bouillon, MD;  Location: [facility];  Service: General;  Laterality: Right;   REDUCTION MAMMAPLASTY Right    REMOVAL OF TISSUE EXPANDER AND PLACEMENT OF IMPLANT Left 03/03/2019   Procedure: REMOVAL OF TISSUE EXPANDER AND PLACEMENT OF EXPANDER;  Surgeon: Peggye Form, DO;  Location:  SURGERY CENTER;  Service: Plastics;  Laterality: Left;   REMOVAL OF TISSUE EXPANDER AND PLACEMENT OF IMPLANT Left 05/25/2019   Procedure: LEFT BREAST  REMOVAL OF TISSUE EXPANDER AND PLACEMENT OF IMPLANT;  Surgeon: Peggye Form, DO;  Location: [facility];  Service: Plastics;  Laterality: Left;   TISSUE EXPANDER PLACEMENT Left 11/03/2018   Procedure: PLACEMENT OF TISSUE EXPANDER LEFT BREAST;  Surgeon: Peggye Form, DO;  [facility];  Service: Plastics;  Laterality: Left;  Total case time is 3.5 hours   TUBAL LIGATION Bilateral 01/21/2011   Patient Active Problem List   Diagnosis Date Noted    Acute meniscal tear, medial, right, initial encounter 11/12/2022   Plica syndrome of right knee 11/12/2022   Chondromalacia, right knee 11/12/2022   Depression 08/13/2022   BMI 40.0-44.9, adult (HCC) 08/13/2022   Obesity, Beginning BMI 08/13/2022   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   Boil of buttock, Left 12/24/2021   Hyperthyroidism 07/22/2021   Low serum thyroid stimulating hormone (TSH) 06/12/2021   Other fatigue 05/23/2021   Hemoglobin low 05/23/2021   Polyphagia 05/16/2020   Vitamin D deficiency 05/16/2020   At risk for osteoporosis 05/16/2020   Prediabetes 05/01/2020   Elevated blood pressure reading 05/01/2020   At risk for impaired metabolic function 05/01/2020   Breast asymmetry following reconstructive surgery 09/20/2019   Essential hypertension 08/17/2018   Class 3 severe obesity with serious comorbidity and body mass index (BMI) of 40.0 to 44.9 in adult [facility]) 09/17/2017   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   Malignant neoplasm of upper-inner quadrant of left breast in female, 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 MD  REFERRING PROVIDER: Cristie Hem, PA-C  REFERRING DIAG: [phone] (ICD-10-CM) - S/P right knee arthroscopy  THERAPY DIAG:  Chronic pain of right knee  Muscle weakness (generalized)  Difficulty in walking, not elsewhere classified  Localized edema  Rationale for Evaluation and Treatment: Rehabilitation  ONSET DATE: 11/12/2022 Surgery  SUBJECTIVE:  SUBJECTIVE STATEMENT: Reported no specific pain, "just soreness".  Did some extra walking today to get out of building.  Not using cane in house.    PERTINENT HISTORY: History of Lt breast cancer, GERD, hyperlipidemia,    PAIN:  NPRS scale: no pain upon arrival.  Pain location: Rt knee  Pain description: tightness, throbbing.  Aggravating factors: increased WB activity, static positioning  Relieving factors: OTC medicine  PRECAUTIONS: None  WEIGHT BEARING RESTRICTIONS: No  FALLS:  Has patient fallen in last 6 months? No  LIVING ENVIRONMENT: Lives with: lives with their family Lives in: House/apartment Stairs: bedroom on 1st floor.  Enter house:  2-3 without hand rails.  Has following equipment at home: [facility], [facility]  OCCUPATION: RN - management with desk, on floor at times.    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   MUSCLE LENGTH: 12/03/2022 No specific testing today  POSTURE:  12/03/2022 Unremarkable  PALPATION: 12/03/2022 Anterior inferior knee.   LOWER EXTREMITY ROM:   ROM Right 12/03/2022 Left 12/03/2022 Right 12/08/22  Hip flexion     Hip extension  Hip abduction     Hip adduction     Hip internal rotation     Hip external rotation     Knee flexion 107 AROM in supine heel slide with pain noted 114 AROM in supine heel slide AAROM in supine 109 deg mild pain   Knee extension  -4 AROM in seated LAQ 0 AROM in seated LAQ   Ankle dorsiflexion     Ankle plantarflexion     Ankle inversion     Ankle eversion      (Blank rows = not tested)  LOWER EXTREMITY MMT:  MMT Right 12/03/2022 Left 12/03/2022  Hip flexion 5/5 5/5  Hip extension    Hip abduction    Hip adduction    Hip internal rotation    Hip external rotation    Knee flexion    Knee extension 5/5 42, 42.1 lbs  5/5 68, 61.4 lbs  Ankle dorsiflexion    Ankle plantarflexion     Ankle inversion    Ankle eversion     (Blank rows = not tested)  LOWER EXTREMITY SPECIAL TESTS:  12/03/2022 No specific testing  FUNCTIONAL TESTS:  12/03/2022 18 inch chair transfer: without hands on 1st try with weight shift to Lt.  Lt SLS: 8 seconds Rt SLS: < 3 seconds  GAIT: 12/10/2022:  Able to perform safe independent ambulation in clinic on level surfaces.   12/03/2022 Mod Independent with cane in Lt UE, reduced stance on Rt LE.                                                                                                                                                                         TODAY'S TREATMENT                                                                            DATE: 12/10/2022 Ther Ex: UBE LE only lvl 2.0 8 mins, seat 12  Leg press double leg 100 lbs x 15, Rt leg only 2 x 15 37 lbs  Seated Rt quad set 5 sec hold x 10 Seated Rt quad set c SLR 2 x 10   Neuro Re-ed: Tandem stance on foam 1 min x 2 bilateral SLS with 6 inch hurdle clear contralateral leg x 15 bilateral with occasional HHA as needed.    TODAY'S TREATMENT  DATE: 12/08/22 Therapeutic Exercise: Supine quad set w/ heel prop 2x12 cues for form SLR 2x6 cues for pacing Standing heel raises 2x15 cues for trunk mechanics Standing hip 45 deg kickbacks unresisted, 2x6 BIL with UE support  Seated edge of mat knee flex/ext AROM 2x8 cues for pacing and control  Seated marches x8 BIL cues for breath control and pacing Supine heel slides w/ strap and pillow case x5 Verbal HEP review   [facility] Adult PT Treatment:                                                                    DATE: 12/04/22 Therapeutic Exercise: Supine quad set with 5 sec hold 2x12 cues for quad activation, second set with heel prop for inc ROM  SLR supine 2x6 cues for control and pacing Standing hamstring curls at counter 2x8 BIL cues for pacing and reduced  compensations at knee  Standing heel raises 2x10 cues for reduced trunk lean  STS from raised mat no UE support 4x5 cues for improved symmetry of WB  Supine heel slides w/ strap and pillow case to minimize friction, PT assisted, x10 HEP verbal review + education on strategies to maximize adherence w/ travel this weekend   TODAY'S TREATMENT                                                                            DATE:  12/03/2022 Therex:    HEP instruction/performance c cues for techniques, handout provided.  Trial set performed of each for comprehension and symptom assessment.  See below for exercise list Nustep Lvl 5 8 mins for ROM  PATIENT EDUCATION:  Education details: rationale for interventions Person educated: Patient Education method: Explanation, Demonstration, Verbal cues, and Handouts Education comprehension: verbalized understanding, returned demonstration, and verbal cues required  HOME EXERCISE PROGRAM: Access Code: [email or web address] URL: [email or web address] Date: 12/03/2022 Prepared by: Chyrel Masson  Exercises - Supine Heel Slide  - 1-2 x daily - 7 x weekly - 1 sets - 10 reps - 2 hold - Supine Quadricep Sets  - 1-2 x daily - 7 x weekly - 1 sets - 10 reps - 5 hold - Seated Long Arc Quad  - 3-5 x daily - 7 x weekly - 1-2 sets - 10 reps - 2 hold - Seated Quad Set  - 3-5 x daily - 7 x weekly - 1 sets - 10 reps - 5 hold - Seated Straight Leg Heel Taps  - 1-2 x daily - 7 x weekly - 3 sets - 10 reps - Sit to Stand  - 3 x daily - 7 x weekly - 1 sets - 10 reps  ASSESSMENT:  CLINICAL IMPRESSION: Fatigue noted in quad activity within clinic.  Improving gait noted to this point with ability to safely walk independent.  Continued skilled PT services indicated at this time.     OBJECTIVE IMPAIRMENTS: Abnormal gait, decreased activity tolerance, decreased balance, decreased coordination, decreased endurance, decreased mobility, difficulty walking,  decreased ROM,  decreased strength, increased edema, increased fascial restrictions, impaired perceived functional ability, impaired flexibility, improper body mechanics, and pain.   ACTIVITY LIMITATIONS: carrying, lifting, bending, sitting, standing, squatting, sleeping, stairs, transfers, and locomotion level  PARTICIPATION LIMITATIONS: meal prep, cleaning, interpersonal relationship, driving, shopping, community activity, and occupation  PERSONAL FACTORS:  History of Lt breast cancer, GERD, hyperlipidemia,   are also affecting patient's functional outcome.   REHAB POTENTIAL: Good  CLINICAL DECISION MAKING: Stable/uncomplicated  EVALUATION COMPLEXITY: Low   GOALS: Goals reviewed with patient? Yes  SHORT TERM GOALS: (target date for Short term goals are 3 weeks  12/24/2022)   1.  Patient will demonstrate independent use of home exercise program to maintain progress from in clinic treatments.  Goal status: on going 12/10/2022  LONG TERM GOALS: (target dates for all long term goals are 10 weeks  02/11/2023 )   1. Patient will demonstrate/report pain at worst less than or equal to 2/10 to facilitate minimal limitation in daily activity secondary to pain symptoms.  Goal status: New   2. Patient will demonstrate independent use of home exercise program to facilitate ability to maintain/progress functional gains from skilled physical therapy services.  Goal status: New   3. Patient will demonstrate FOTO outcome > or = 62 % to indicate reduced disability due to condition.  Goal status: New   4.  Patient will demonstrate Rt LE dynamometry testing with 10 % of Lt for knee extension to faciltiate usual transfers, stairs, squatting at PLOF for daily life.   Goal status: New   5.  Patient will demonstrate independent ambulation community distances > 500 ft  Goal status: New   6.  Patient will demonstrate ascending/descending stairs reciprocally s UE assist for community integration.   Goal status:  New   7.  Patient will demonstrate return to work at [name].  Goal Status: New   PLAN:  PT FREQUENCY: 1-2x/week  PT DURATION: 10 weeks  PLANNED INTERVENTIONS: Therapeutic exercises, Therapeutic activity, Neuro Muscular re-education, Balance training, Gait training, Patient/Family education, Joint mobilization, Stair training, DME instructions, Dry Needling, Electrical stimulation, Traction, Cryotherapy, vasopneumatic deviceMoist heat, Taping, Ultrasound, Ionotophoresis 4mg /ml Dexamethasone, and aquatic therapy, Manual therapy.  All included unless contraindicated  PLAN FOR NEXT SESSION: Progressive strengthening, dynamic balance as able.    Chyrel Masson, PT, DPT, OCS, ATC 12/10/22  3:48 PM

## 2022-12-15 ENCOUNTER — Ambulatory Visit: Payer: 59 | Attending: Student

## 2022-12-15 VITALS — Wt 254.1 lb

## 2022-12-15 DIAGNOSIS — N952 Postmenopausal atrophic vaginitis: Secondary | ICD-10-CM | POA: Diagnosis not present

## 2022-12-15 DIAGNOSIS — E785 Hyperlipidemia, unspecified: Secondary | ICD-10-CM | POA: Diagnosis not present

## 2022-12-15 DIAGNOSIS — Z483 Aftercare following surgery for neoplasm: Secondary | ICD-10-CM | POA: Insufficient documentation

## 2022-12-15 DIAGNOSIS — N393 Stress incontinence (female) (male): Secondary | ICD-10-CM | POA: Diagnosis not present

## 2022-12-15 NOTE — Therapy (Addendum)
OUTPATIENT PHYSICAL THERAPY SOZO SCREENING NOTE   Patient Name: Mackenzie [name] MRN: [id number] DOB:August 08, 1970, 52 y.o., female Today's Date: 12/15/2022  PCP: Dois Davenport, MD REFERRING PROVIDER: Laurena Spies, PA-C   PT End of Session - 12/15/22 [phone]     Visit Number 4   # unchanged due to screen only   PT Start Time 0859    PT Stop Time 0905    PT Time Calculation (min) 6 min    Activity Tolerance Patient tolerated treatment well    Behavior During Therapy [facility] for tasks assessed/performed             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   Allergic rhinitis 2012   Anemia 01/27/2017   prior to starting chemotherapy   Breast cancer (HCC) 01/14/2017   Left breast   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, left (HCC) 01/23/2017   Edema, lower extremity    GERD (gastroesophageal reflux disease)    Healthcare maintenance 03/25/2017   Hot flashes 03/2017   Hyperlipidemia 03/20/2016   Morbid obesity with body mass index (BMI) of 40.0 to 49.9 (HCC) 09/17/2017   Pre-diabetes 07/26/2018   Hgb A1C elevated on 07/26/2018, Gestational Diabetes 2012   Seasonal allergies 2012   seasonal allergies causes allergic rhinitis and itchy, dry eyes per pt   Vitamin D deficiency 05/2015   Past Surgical History:  Procedure Laterality Date   BREAST RECONSTRUCTION WITH PLACEMENT OF TISSUE EXPANDER AND FLEX HD (ACELLULAR HYDRATED DERMIS) Left 08/27/2017   Procedure: LEFT BREAST RECONSTRUCTION WITH PLACEMENT OF TISSUE EXPANDER AND FLEX HD;  Surgeon: Peggye Form, DO;  [facility];  Service: Plastics;  Laterality: Left;   CESAREAN SECTION     x2   KNEE ARTHROSCOPY WITH MEDIAL MENISECTOMY Right 11/12/2022   Procedure: RIGHT KNEE ARTHROSCOPY, PARTIAL MEDIAL MENISCECTOMY;  Surgeon: Tarry Kos, MD;  Location: [facility];  Service: Orthopedics;  Laterality: Right;   LATISSIMUS  FLAP TO BREAST Left 11/03/2018   LATISSIMUS FLAP TO BREAST Left 11/03/2018   Procedure: LATISSIMUS FLAP TO LEFT BREAST;  Surgeon: Peggye Form, DO;  [facility];  Service: Plastics;  Laterality: Left;   MASTECTOMY MODIFIED RADICAL Left 08/27/2017   MASTECTOMY MODIFIED RADICAL Left 08/27/2017   Procedure: LEFT MODIFIED RADICAL MASTECTOMY;  Surgeon: Harriette Bouillon, MD;  [facility];  Service: General;  Laterality: Left;   MASTOPEXY Right 03/03/2019   Procedure: RIGHT BREAST MASTOPEXY/REDUCTION;  Surgeon: Peggye Form, DO;  Location: [facility];  Service: Plastics;  Laterality: Right;  3 hours, please   PORT-A-CATH REMOVAL Right 08/27/2017   Procedure: REMOVAL PORT-A-CATH RIGHT CHEST;  Surgeon: Harriette Bouillon, MD;  [facility];  Service: General;  Laterality: Right;   PORTACATH PLACEMENT Right 01/28/2017   Procedure: INSERTION PORT-A-CATH WITH ULTRASOUND;  Surgeon: Harriette Bouillon, MD;  Location: [facility];  Service: General;  Laterality: Right;   REDUCTION MAMMAPLASTY Right    REMOVAL OF TISSUE EXPANDER AND PLACEMENT OF IMPLANT Left 03/03/2019   Procedure: REMOVAL OF TISSUE EXPANDER AND PLACEMENT OF EXPANDER;  Surgeon: Peggye Form, DO;  Location: [facility];  Service: Plastics;  Laterality: Left;   REMOVAL OF TISSUE EXPANDER AND PLACEMENT OF IMPLANT Left 05/25/2019   Procedure: LEFT BREAST REMOVAL OF TISSUE EXPANDER AND PLACEMENT OF IMPLANT;  Surgeon: Peggye Form, DO;  Location: [facility];  Service: Government social research officer;  Laterality: Left;   TISSUE EXPANDER PLACEMENT Left 11/03/2018   Procedure: PLACEMENT OF TISSUE EXPANDER LEFT BREAST;  Surgeon: Peggye Form, DO;  [facility];  Service: Plastics;  Laterality: Left;  Total case time is 3.5 hours   TUBAL LIGATION Bilateral 01/21/2011   Patient Active Problem List   Diagnosis Date Noted   Acute meniscal tear, medial, right, initial encounter  11/12/2022   Plica syndrome of right knee 11/12/2022   Chondromalacia, right knee 11/12/2022   Depression 08/13/2022   BMI 40.0-44.9, adult (HCC) 08/13/2022   Obesity, Beginning BMI 08/13/2022   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   Boil of buttock, Left 12/24/2021   Hyperthyroidism 07/22/2021   Low serum thyroid stimulating hormone (TSH) 06/12/2021   Other fatigue 05/23/2021   Hemoglobin low 05/23/2021   Polyphagia 05/16/2020   Vitamin D deficiency 05/16/2020   At risk for osteoporosis 05/16/2020   Prediabetes 05/01/2020   Elevated blood pressure reading 05/01/2020   At risk for impaired metabolic function 05/01/2020   Breast asymmetry following reconstructive surgery 09/20/2019   Essential hypertension 08/17/2018   Class 3 severe obesity with serious comorbidity and body mass index (BMI) of 40.0 to 44.9 in adult [facility]) 09/17/2017   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   Malignant neoplasm of upper-inner quadrant of left breast in female, 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 Then had neo-adjuvant chemo starting 02/10/17 until 06/26/17; had left mastectomy 08/27/17 with ALND and immediate expander placed. Radiation completed. Expander removed and replaced on May 27, 19 b/c it did not get expanded enough prior to radiation, also had lat flap 11/03/18. Had expander removed and replaced again on 03/03/19 due to a small leak, pt had reduction on R breast at this time   PRECAUTIONS: left UE Lymphedema risk, None  SUBJECTIVE: Pt returns for  her 3 month L-Dex screen. "I tore my mensicus since I was here last and had a meniscectomy. I'm currently going to PT for that."  PAIN:  Are you having pain? Yes: NPRS scale: 6/10 Pain location: Rt knee Pain description: medial Aggravating factors: meniscus surgery  Relieving factors: exercises help but I haven't done them since Friday  SOZO SCREENING: Patient was assessed today using the SOZO machine to determine the lymphedema index score. This was compared to her baseline score. It was determined that she is within the recommended range when compared to her baseline and no further action is needed at this time. She will continue SOZO screenings. These are done every 3 months for 2 years post operatively followed by every 6 months for 2 years, and then annually.   L-DEX FLOWSHEETS - 12/15/22 0900       L-DEX LYMPHEDEMA SCREENING   Measurement Type Unilateral    L-DEX MEASUREMENT EXTREMITY Upper Extremity    POSITION  Standing    DOMINANT SIDE Right    At Risk Side Left    BASELINE SCORE (UNILATERAL) 5.3    L-DEX SCORE (UNILATERAL) 5.7    VALUE CHANGE (UNILAT) 0.4  Hermenia Bers, PTA 12/15/2022, 9:05 AM

## 2022-12-16 ENCOUNTER — Ambulatory Visit (INDEPENDENT_AMBULATORY_CARE_PROVIDER_SITE_OTHER): Payer: 59 | Admitting: Rehabilitative and Restorative Service Providers"

## 2022-12-16 ENCOUNTER — Encounter: Payer: Self-pay | Admitting: Rehabilitative and Restorative Service Providers"

## 2022-12-16 DIAGNOSIS — M25561 Pain in right knee: Secondary | ICD-10-CM

## 2022-12-16 DIAGNOSIS — R600 Localized edema: Secondary | ICD-10-CM | POA: Diagnosis not present

## 2022-12-16 DIAGNOSIS — M6281 Muscle weakness (generalized): Secondary | ICD-10-CM | POA: Diagnosis not present

## 2022-12-16 DIAGNOSIS — G8929 Other chronic pain: Secondary | ICD-10-CM

## 2022-12-16 DIAGNOSIS — R262 Difficulty in walking, not elsewhere classified: Secondary | ICD-10-CM | POA: Diagnosis not present

## 2022-12-16 DIAGNOSIS — H43811 Vitreous degeneration, right eye: Secondary | ICD-10-CM | POA: Diagnosis not present

## 2022-12-16 DIAGNOSIS — H43822 Vitreomacular adhesion, left eye: Secondary | ICD-10-CM | POA: Diagnosis not present

## 2022-12-16 NOTE — Therapy (Signed)
OUTPATIENT PHYSICAL THERAPY TREATMENT NOTE   Patient Name: Mackenzie [name] MRN: [id number] DOB:October 05, 1970, 52 y.o., female Today's Date: 12/16/2022  END OF SESSION:  PT End of Session - 12/16/22 1136     Visit Number 5    Number of Visits 20    Date for PT Re-Evaluation 02/11/23    Authorization Type [facility] [employer], deductible met    Progress Note Due on Visit 10    PT Start Time 1140    PT Stop Time 1220    PT Time Calculation (min) 40 min    Activity Tolerance Patient tolerated treatment well    Behavior During Therapy [facility] for tasks assessed/performed                 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   Allergic rhinitis 2012   Anemia 01/27/2017   prior to starting chemotherapy   Breast cancer (HCC) 01/14/2017   Left breast   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, left (HCC) 01/23/2017   Edema, lower extremity    GERD (gastroesophageal reflux disease)    Healthcare maintenance 03/25/2017   Hot flashes 03/2017   Hyperlipidemia 03/20/2016   Morbid obesity with body mass index (BMI) of 40.0 to 49.9 (HCC) 09/17/2017   Pre-diabetes 07/26/2018   Hgb A1C elevated on 07/26/2018, Gestational Diabetes 2012   Seasonal allergies 2012   seasonal allergies causes allergic rhinitis and itchy, dry eyes per pt   Vitamin D deficiency 05/2015   Past Surgical History:  Procedure Laterality Date   BREAST RECONSTRUCTION WITH PLACEMENT OF TISSUE EXPANDER AND FLEX HD (ACELLULAR HYDRATED DERMIS) Left 08/27/2017   Procedure: LEFT BREAST RECONSTRUCTION WITH PLACEMENT OF TISSUE EXPANDER AND FLEX HD;  Surgeon: Peggye Form, DO;  Location: [facility];  Service: Plastics;  Laterality: Left;   CESAREAN SECTION     x2   KNEE ARTHROSCOPY WITH MEDIAL MENISECTOMY Right 11/12/2022   Procedure: RIGHT KNEE ARTHROSCOPY, PARTIAL MEDIAL MENISCECTOMY;  Surgeon: Tarry Kos, MD;  Location: [facility];  Service: Orthopedics;  Laterality: Right;   LATISSIMUS FLAP TO BREAST Left 11/03/2018   LATISSIMUS FLAP TO BREAST Left 11/03/2018   Procedure: LATISSIMUS FLAP TO LEFT BREAST;  Surgeon: Peggye Form, DO;  Location: [facility];  Service: Plastics;  Laterality: Left;   MASTECTOMY MODIFIED RADICAL Left 08/27/2017   MASTECTOMY MODIFIED RADICAL Left 08/27/2017   Procedure: LEFT MODIFIED RADICAL MASTECTOMY;  Surgeon: Harriette Bouillon, MD;  Location: [facility];  Service: General;  Laterality: Left;   MASTOPEXY Right 03/03/2019   Procedure: RIGHT BREAST MASTOPEXY/REDUCTION;  Surgeon: Peggye Form, DO;  Location: [facility];  Service: Plastics;  Laterality: Right;  3 hours, please   PORT-A-CATH REMOVAL Right 08/27/2017   Procedure: REMOVAL PORT-A-CATH RIGHT CHEST;  Surgeon: Harriette Bouillon, MD;  Location: [facility];  Service: General;  Laterality: Right;   PORTACATH PLACEMENT Right 01/28/2017   Procedure: INSERTION PORT-A-CATH WITH ULTRASOUND;  Surgeon: Harriette Bouillon, MD;  Location: [facility];  Service: General;  Laterality: Right;   REDUCTION MAMMAPLASTY Right    REMOVAL OF TISSUE EXPANDER AND PLACEMENT OF IMPLANT Left 03/03/2019   Procedure: REMOVAL OF TISSUE EXPANDER AND PLACEMENT OF EXPANDER;  Surgeon: Peggye Form, DO;  Location:  SURGERY CENTER;  Service: Plastics;  Laterality: Left;   REMOVAL OF TISSUE EXPANDER AND PLACEMENT OF IMPLANT Left 05/25/2019   Procedure: LEFT  BREAST REMOVAL OF TISSUE EXPANDER AND PLACEMENT OF IMPLANT;  Surgeon: Peggye Form, DO;  Location: [facility];  Service: Plastics;  Laterality: Left;   TISSUE EXPANDER PLACEMENT Left 11/03/2018   Procedure: PLACEMENT OF TISSUE EXPANDER LEFT BREAST;  Surgeon: Peggye Form, DO;  Location: [facility];  Service: Plastics;  Laterality: Left;  Total case time is 3.5 hours   TUBAL LIGATION Bilateral 01/21/2011   Patient Active Problem List   Diagnosis Date  Noted   Acute meniscal tear, medial, right, initial encounter 11/12/2022   Plica syndrome of right knee 11/12/2022   Chondromalacia, right knee 11/12/2022   Depression 08/13/2022   BMI 40.0-44.9, adult (HCC) 08/13/2022   Obesity, Beginning BMI 08/13/2022   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   Boil of buttock, Left 12/24/2021   Hyperthyroidism 07/22/2021   Low serum thyroid stimulating hormone (TSH) 06/12/2021   Other fatigue 05/23/2021   Hemoglobin low 05/23/2021   Polyphagia 05/16/2020   Vitamin D deficiency 05/16/2020   At risk for osteoporosis 05/16/2020   Prediabetes 05/01/2020   Elevated blood pressure reading 05/01/2020   At risk for impaired metabolic function 05/01/2020   Breast asymmetry following reconstructive surgery 09/20/2019   Essential hypertension 08/17/2018   Class 3 severe obesity with serious comorbidity and body mass index (BMI) of 40.0 to 44.9 in adult [facility]) 09/17/2017   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   Malignant neoplasm of upper-inner quadrant of left breast in female, 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 MD  REFERRING PROVIDER: Cristie Hem, PA-C  REFERRING DIAG: [phone] (ICD-10-CM) - S/P right knee arthroscopy  THERAPY DIAG:  Chronic pain of right knee  Muscle weakness (generalized)  Difficulty in walking, not elsewhere classified  Localized edema  Rationale for Evaluation and Treatment: Rehabilitation  ONSET DATE: 11/12/2022 Surgery  SUBJECTIVE:  SUBJECTIVE STATEMENT: She indicated having some pain increase the other day, didn't take medicine as usual and noticed it.  Also reported increased swelling in ankles after activity as well.  No pain upon arrival.     PERTINENT HISTORY: History of Lt breast cancer, GERD, hyperlipidemia,   PAIN:  NPRS scale: no pain upon arrival.  Pain location: Rt knee  Pain description: tightness, throbbing.  Aggravating factors: increased WB activity, static positioning  Relieving factors: OTC medicine  PRECAUTIONS: None  WEIGHT BEARING RESTRICTIONS: No  FALLS:  Has patient fallen in last 6 months? No  LIVING ENVIRONMENT: Lives with: lives with their family Lives in: House/apartment Stairs: bedroom on 1st floor.  Enter house:  2-3 without hand rails.  Has following equipment at home: FWW, [facility]  OCCUPATION: RN - management with desk, on floor at times.    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   MUSCLE LENGTH: 12/03/2022 No specific testing today  POSTURE:  12/03/2022 Unremarkable  PALPATION: 12/03/2022 Anterior inferior knee.   LOWER EXTREMITY ROM:   ROM Right 12/03/2022 Left 12/03/2022 Right 12/08/22  Hip flexion     Hip extension     Hip abduction     Hip adduction     Hip internal rotation     Hip external rotation     Knee flexion 107 AROM in supine heel slide with pain noted 114 AROM in supine heel slide AAROM in supine 109 deg mild pain   Knee extension  -4 AROM in seated LAQ 0 AROM in seated LAQ   Ankle dorsiflexion     Ankle plantarflexion     Ankle inversion     Ankle eversion      (Blank rows = not tested)  LOWER EXTREMITY MMT:  MMT Right 12/03/2022 Left 12/03/2022  Hip flexion 5/5 5/5  Hip extension    Hip abduction    Hip adduction    Hip internal rotation    Hip external rotation    Knee flexion    Knee extension  5/5 42, 42.1 lbs  5/5 68, 61.4 lbs  Ankle dorsiflexion    Ankle plantarflexion    Ankle inversion    Ankle eversion     (Blank rows = not tested)  LOWER EXTREMITY SPECIAL TESTS:  12/03/2022 No specific testing  FUNCTIONAL TESTS:  12/16/2022: Rt SLS:  19 seconds   12/03/2022 18 inch chair transfer: without hands on 1st try with weight shift to Lt.  Lt SLS: 8 seconds Rt SLS: < 3 seconds  GAIT: 12/10/2022:  Able to perform safe independent ambulation in clinic on level surfaces.   12/03/2022 Mod Independent with cane in Lt UE, reduced stance on Rt LE.                                                                                                                                                                         TODAY'S TREATMENT                                                                            DATE: 12/16/2022 Ther Ex: UBE LE only lvl 2.0 8 mins, seat 12  Leg press double leg 100 lbs x 15, Rt leg only 2 x 10 50 lbs  Step on over and down 4 inch step x 10 WB on Rt leg with single hand rail assist Lateral step down slowly WB on Rt leg x 15 Sit to stand to sit x 15 no UE assist.    Neuro Re-ed: SLS on black mat with contralateral leg  corner touching lightly x 8 each, performed bilaterally with occasional HHA on bar  TODAY'S TREATMENT                                                                            DATE: 12/10/2022 Ther Ex: UBE LE only lvl 2.0 8 mins, seat 12  Leg press double leg 100 lbs x 15, Rt leg only 2 x 15 37 lbs  Seated Rt quad set 5 sec hold x 10 Seated Rt quad set c SLR 2 x 10   Neuro Re-ed: Tandem stance on foam 1 min x 2 bilateral SLS with 6 inch hurdle clear contralateral leg x 15 bilateral with occasional HHA as needed.    TODAY'S TREATMENT                                                                            DATE: 12/08/22 Therapeutic Exercise: Supine quad set w/ heel prop 2x12 cues for form SLR 2x6 cues for pacing Standing heel raises  2x15 cues for trunk mechanics Standing hip 45 deg kickbacks unresisted, 2x6 BIL with UE support  Seated edge of mat knee flex/ext AROM 2x8 cues for pacing and control  Seated marches x8 BIL cues for breath control and pacing Supine heel slides w/ strap and pillow case x5 Verbal HEP review   [facility] Adult PT Treatment:                                                                    DATE: 12/04/22 Therapeutic Exercise: Supine quad set with 5 sec hold 2x12 cues for quad activation, second set with heel prop for inc ROM  SLR supine 2x6 cues for control and pacing Standing hamstring curls at counter 2x8 BIL cues for pacing and reduced compensations at knee  Standing heel raises 2x10 cues for reduced trunk lean  STS from raised mat no UE support 4x5 cues for improved symmetry of WB  Supine heel slides w/ strap and pillow case to minimize friction, PT assisted, x10 HEP verbal review + education on strategies to maximize adherence w/ travel this weekend   PATIENT EDUCATION:  Education details: rationale for interventions Person educated: Patient Education method: Programmer, multimedia, Demonstration, Verbal cues, and Handouts Education comprehension: verbalized understanding, returned demonstration, and verbal cues required  HOME EXERCISE PROGRAM: Access Code: [email or web address] URL: [email or web address] Date: 12/03/2022 Prepared by: Chyrel Masson  Exercises - Supine Heel Slide  - 1-2 x daily - 7 x weekly - 1 sets - 10 reps - 2 hold - Supine Quadricep Sets  - 1-2 x daily - 7 x weekly - 1 sets - 10 reps - 5 hold - Seated Long Arc Quad  - 3-5  x daily - 7 x weekly - 1-2 sets - 10 reps - 2 hold - Seated Quad Set  - 3-5 x daily - 7 x weekly - 1 sets - 10 reps - 5 hold - Seated Straight Leg Heel Taps  - 1-2 x daily - 7 x weekly - 3 sets - 10 reps - Sit to Stand  - 3 x daily - 7 x weekly - 1 sets - 10 reps  ASSESSMENT:  CLINICAL IMPRESSION: Continued progressive quad strengthening with  introduction of WB stair training on reduced height compared to normal step height to promote improved strength and control.  Continued skilled PT services indicated at this time.  Crepitus from posterior patella noted at times during treatment, not painful.    OBJECTIVE IMPAIRMENTS: Abnormal gait, decreased activity tolerance, decreased balance, decreased coordination, decreased endurance, decreased mobility, difficulty walking, decreased ROM, decreased strength, increased edema, increased fascial restrictions, impaired perceived functional ability, impaired flexibility, improper body mechanics, and pain.   ACTIVITY LIMITATIONS: carrying, lifting, bending, sitting, standing, squatting, sleeping, stairs, transfers, and locomotion level  PARTICIPATION LIMITATIONS: meal prep, cleaning, interpersonal relationship, driving, shopping, community activity, and occupation  PERSONAL FACTORS:  History of Lt breast cancer, GERD, hyperlipidemia,   are also affecting patient's functional outcome.   REHAB POTENTIAL: Good  CLINICAL DECISION MAKING: Stable/uncomplicated  EVALUATION COMPLEXITY: Low   GOALS: Goals reviewed with patient? Yes  SHORT TERM GOALS: (target date for Short term goals are 3 weeks  12/24/2022)   1.  Patient will demonstrate independent use of home exercise program to maintain progress from in clinic treatments.  Goal status: Met   LONG TERM GOALS: (target dates for all long term goals are 10 weeks  02/11/2023 )   1. Patient will demonstrate/report pain at worst less than or equal to 2/10 to facilitate minimal limitation in daily activity secondary to pain symptoms.  Goal status: New   2. Patient will demonstrate independent use of home exercise program to facilitate ability to maintain/progress functional gains from skilled physical therapy services.  Goal status: New   3. Patient will demonstrate FOTO outcome > or = 62 % to indicate reduced disability due to  condition.  Goal status: New   4.  Patient will demonstrate Rt LE dynamometry testing with 10 % of Lt for knee extension to faciltiate usual transfers, stairs, squatting at PLOF for daily life.   Goal status: New   5.  Patient will demonstrate independent ambulation community distances > 500 ft  Goal status: New   6.  Patient will demonstrate ascending/descending stairs reciprocally s UE assist for community integration.   Goal status: New   7.  Patient will demonstrate return to work at [name].  Goal Status: New   PLAN:  PT FREQUENCY: 1-2x/week  PT DURATION: 10 weeks  PLANNED INTERVENTIONS: Therapeutic exercises, Therapeutic activity, Neuro Muscular re-education, Balance training, Gait training, Patient/Family education, Joint mobilization, Stair training, DME instructions, Dry Needling, Electrical stimulation, Traction, Cryotherapy, vasopneumatic deviceMoist heat, Taping, Ultrasound, Ionotophoresis 4mg /ml Dexamethasone, and aquatic therapy, Manual therapy.  All included unless contraindicated  PLAN FOR NEXT SESSION: Progressive WB strengthening, compliant surface balance.    Chyrel Masson, PT, DPT, OCS, ATC 12/16/22  12:19 PM

## 2022-12-18 NOTE — Therapy (Signed)
OUTPATIENT PHYSICAL THERAPY TREATMENT NOTE   Patient Name: Mackenzie [name] MRN: [id number] DOB:05-18-1971, 52 y.o., female Today's Date: 12/19/2022  END OF SESSION:  PT End of Session - 12/19/22 0839     Visit Number 6    Number of Visits 20    Date for PT Re-Evaluation 02/11/23    Authorization Type [facility] [employer], deductible met    Progress Note Due on Visit 10    PT Start Time 0836    PT Stop Time 0916    PT Time Calculation (min) 40 min    Activity Tolerance Patient tolerated treatment well    Behavior During Therapy [facility] for tasks assessed/performed                  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   Allergic rhinitis 2012   Anemia 01/27/2017   prior to starting chemotherapy   Breast cancer (HCC) 01/14/2017   Left breast   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, left (HCC) 01/23/2017   Edema, lower extremity    GERD (gastroesophageal reflux disease)    Healthcare maintenance 03/25/2017   Hot flashes 03/2017   Hyperlipidemia 03/20/2016   Morbid obesity with body mass index (BMI) of 40.0 to 49.9 (HCC) 09/17/2017   Pre-diabetes 07/26/2018   Hgb A1C elevated on 07/26/2018, Gestational Diabetes 2012   Seasonal allergies 2012   seasonal allergies causes allergic rhinitis and itchy, dry eyes per pt   Vitamin D deficiency 05/2015   Past Surgical History:  Procedure Laterality Date   BREAST RECONSTRUCTION WITH PLACEMENT OF TISSUE EXPANDER AND FLEX HD (ACELLULAR HYDRATED DERMIS) Left 08/27/2017   Procedure: LEFT BREAST RECONSTRUCTION WITH PLACEMENT OF TISSUE EXPANDER AND FLEX HD;  Surgeon: Peggye Form, DO;  Location: [facility];  Service: Plastics;  Laterality: Left;   CESAREAN SECTION     x2   KNEE ARTHROSCOPY WITH MEDIAL MENISECTOMY Right 11/12/2022   Procedure: RIGHT KNEE ARTHROSCOPY, PARTIAL MEDIAL MENISCECTOMY;  Surgeon: Tarry Kos, MD;  Location: [facility];  Service: Orthopedics;  Laterality: Right;   LATISSIMUS FLAP TO BREAST Left 11/03/2018   LATISSIMUS FLAP TO BREAST Left 11/03/2018   Procedure: LATISSIMUS FLAP TO LEFT BREAST;  Surgeon: Peggye Form, DO;  Location: [facility];  Service: Plastics;  Laterality: Left;   MASTECTOMY MODIFIED RADICAL Left 08/27/2017   MASTECTOMY MODIFIED RADICAL Left 08/27/2017   Procedure: LEFT MODIFIED RADICAL MASTECTOMY;  Surgeon: Harriette Bouillon, MD;  Location: [facility];  Service: General;  Laterality: Left;   MASTOPEXY Right 03/03/2019   Procedure: RIGHT BREAST MASTOPEXY/REDUCTION;  Surgeon: Peggye Form, DO;  Location: [facility];  Service: Plastics;  Laterality: Right;  3 hours, please   PORT-A-CATH REMOVAL Right 08/27/2017   Procedure: REMOVAL PORT-A-CATH RIGHT CHEST;  Surgeon: Harriette Bouillon, MD;  Location: [facility];  Service: General;  Laterality: Right;   PORTACATH PLACEMENT Right 01/28/2017   Procedure: INSERTION PORT-A-CATH WITH ULTRASOUND;  Surgeon: Harriette Bouillon, MD;  Location: [facility];  Service: General;  Laterality: Right;   REDUCTION MAMMAPLASTY Right    REMOVAL OF TISSUE EXPANDER AND PLACEMENT OF IMPLANT Left 03/03/2019   Procedure: REMOVAL OF TISSUE EXPANDER AND PLACEMENT OF EXPANDER;  Surgeon: Peggye Form, DO;  Location: [facility];  Service: Plastics;  Laterality: Left;   REMOVAL OF TISSUE EXPANDER AND PLACEMENT OF IMPLANT Left 05/25/2019   Procedure:  LEFT BREAST REMOVAL OF TISSUE EXPANDER AND PLACEMENT OF IMPLANT;  Surgeon: Peggye Form, DO;  Location: [facility];  Service: Plastics;  Laterality: Left;   TISSUE EXPANDER PLACEMENT Left 11/03/2018   Procedure: PLACEMENT OF TISSUE EXPANDER LEFT BREAST;  Surgeon: Peggye Form, DO;  Location: [facility];  Service: Plastics;  Laterality: Left;  Total case time is 3.5 hours   TUBAL LIGATION Bilateral 01/21/2011   Patient Active Problem List   Diagnosis Date  Noted   Acute meniscal tear, medial, right, initial encounter 11/12/2022   Plica syndrome of right knee 11/12/2022   Chondromalacia, right knee 11/12/2022   Depression 08/13/2022   BMI 40.0-44.9, adult (HCC) 08/13/2022   Obesity, Beginning BMI 08/13/2022   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   Boil of buttock, Left 12/24/2021   Hyperthyroidism 07/22/2021   Low serum thyroid stimulating hormone (TSH) 06/12/2021   Other fatigue 05/23/2021   Hemoglobin low 05/23/2021   Polyphagia 05/16/2020   Vitamin D deficiency 05/16/2020   At risk for osteoporosis 05/16/2020   Prediabetes 05/01/2020   Elevated blood pressure reading 05/01/2020   At risk for impaired metabolic function 05/01/2020   Breast asymmetry following reconstructive surgery 09/20/2019   Essential hypertension 08/17/2018   Class 3 severe obesity with serious comorbidity and body mass index (BMI) of 40.0 to 44.9 in adult [facility]) 09/17/2017   Acquired absence of left breast 09/15/2017   Malignant neoplasm of upper-inner quadrant of left breast in female, 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 MD  REFERRING PROVIDER: Cristie Hem, PA-C  REFERRING DIAG: [phone] (ICD-10-CM) - S/P right knee arthroscopy  THERAPY DIAG:  Chronic pain of right knee  Muscle weakness (generalized)  Difficulty in walking, not elsewhere classified  Localized edema  Rationale for Evaluation and Treatment: Rehabilitation  ONSET DATE: 11/12/2022 Surgery  SUBJECTIVE:  SUBJECTIVE STATEMENT: She indicated feeling generally tired today.  Reported no pain in knee upon arrival.    PERTINENT HISTORY: History of Lt breast cancer, GERD, hyperlipidemia,   PAIN:  NPRS scale: no pain  upon arrival.  Pain location: Rt knee  Pain description: tightness, throbbing.  Aggravating factors: increased WB activity, static positioning  Relieving factors: OTC medicine  PRECAUTIONS: None  WEIGHT BEARING RESTRICTIONS: No  FALLS:  Has patient fallen in last 6 months? No  LIVING ENVIRONMENT: Lives with: lives with their family Lives in: House/apartment Stairs: bedroom on 1st floor.  Enter house:  2-3 without hand rails.  Has following equipment at home: FWW, [facility]  OCCUPATION: RN - management with desk, on floor at times.    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   MUSCLE LENGTH: 12/03/2022 No specific testing today  POSTURE:  12/03/2022 Unremarkable  PALPATION: 12/03/2022 Anterior inferior knee.   LOWER EXTREMITY ROM:   ROM Right 12/03/2022 Left 12/03/2022 Right 12/08/22  Hip flexion     Hip extension     Hip abduction  Hip adduction     Hip internal rotation     Hip external rotation     Knee flexion 107 AROM in supine heel slide with pain noted 114 AROM in supine heel slide AAROM in supine 109 deg mild pain   Knee extension  -4 AROM in seated LAQ 0 AROM in seated LAQ   Ankle dorsiflexion     Ankle plantarflexion     Ankle inversion     Ankle eversion      (Blank rows = not tested)  LOWER EXTREMITY MMT:  MMT Right 12/03/2022 Left 12/03/2022  Hip flexion 5/5 5/5  Hip extension    Hip abduction    Hip adduction    Hip internal rotation    Hip external rotation    Knee flexion    Knee extension 5/5 42, 42.1 lbs  5/5 68, 61.4 lbs  Ankle dorsiflexion    Ankle plantarflexion    Ankle inversion    Ankle  eversion     (Blank rows = not tested)  LOWER EXTREMITY SPECIAL TESTS:  12/03/2022 No specific testing  FUNCTIONAL TESTS:  12/16/2022: Rt SLS:  19 seconds   12/03/2022 18 inch chair transfer: without hands on 1st try with weight shift to Lt.  Lt SLS: 8 seconds Rt SLS: < 3 seconds  GAIT: 12/10/2022:  Able to perform safe independent ambulation in clinic on level surfaces.   12/03/2022 Mod Independent with cane in Lt UE, reduced stance on Rt LE.                                                                                                                                                                         TODAY'S TREATMENT                                                                            DATE: 12/18/2022 Ther Ex: UBE LE only lvl 3.0 8 mins, seat 12  Leg press double leg 100 lbs x 15, Rt leg only 2 x 15 50 lbs  Lateral step down slowly 2 x 10 , performed bilaterally TRX assist squat over chair x 10   Neuro Re-ed: Tandem ambulation fwd/back on foam in // bars 6 ft x 5 each way  9 inch hurdle step to gait pattern in // bars with occasional to moderate HHA 10 ft x 5 leading with each LE 9 inch hurdle side to side stepping in // bars  with occasional to moderate HHA 10 ft x 3 each direction.   TODAY'S TREATMENT                                                                            DATE: 12/16/2022 Ther Ex: UBE LE only lvl 2.0 8 mins, seat 12  Leg press double leg 100 lbs x 15, Rt leg only 2 x 10 50 lbs  Step on over and down 4 inch step x 10 WB on Rt leg with single hand rail assist Lateral step down slowly WB on Rt leg x 15 Sit to stand to sit x 15 no UE assist.    Neuro Re-ed: SLS on black mat with contralateral leg corner touching lightly x 8 each, performed bilaterally with occasional HHA on bar  TODAY'S TREATMENT                                                                            DATE: 12/10/2022 Ther Ex: UBE LE only lvl 2.0 8 mins, seat 12  Leg press  double leg 100 lbs x 15, Rt leg only 2 x 15 37 lbs  Seated Rt quad set 5 sec hold x 10 Seated Rt quad set c SLR 2 x 10   Neuro Re-ed: Tandem stance on foam 1 min x 2 bilateral SLS with 6 inch hurdle clear contralateral leg x 15 bilateral with occasional HHA as needed.    PATIENT EDUCATION:  Education details: rationale for interventions Person educated: Patient Education method: Explanation, Demonstration, Verbal cues, and Handouts Education comprehension: verbalized understanding, returned demonstration, and verbal cues required  HOME EXERCISE PROGRAM: Access Code: [email or web address] URL: [email or web address] Date: 12/03/2022 Prepared by: Chyrel Masson  Exercises - Supine Heel Slide  - 1-2 x daily - 7 x weekly - 1 sets - 10 reps - 2 hold - Supine Quadricep Sets  - 1-2 x daily - 7 x weekly - 1 sets - 10 reps - 5 hold - Seated Long Arc Quad  - 3-5 x daily - 7 x weekly - 1-2 sets - 10 reps - 2 hold - Seated Quad Set  - 3-5 x daily - 7 x weekly - 1 sets - 10 reps - 5 hold - Seated Straight Leg Heel Taps  - 1-2 x daily - 7 x weekly - 3 sets - 10 reps - Sit to Stand  - 3 x daily - 7 x weekly - 1 sets - 10 reps  ASSESSMENT:  CLINICAL IMPRESSION: Fair control in dynamic balance /SLS activity in clinic today.  Continued improvement in control will help improve stability in independent ambulation on even and uneven surfaces. Plan to continue strengthening program with variable compliant surface/dynamic balance intervention improvements.    OBJECTIVE IMPAIRMENTS: Abnormal gait, decreased activity tolerance, decreased balance, decreased coordination, decreased endurance, decreased mobility, difficulty walking, decreased ROM, decreased strength, increased edema, increased fascial restrictions, impaired perceived functional ability, impaired flexibility, improper  body mechanics, and pain.   ACTIVITY LIMITATIONS: carrying, lifting, bending, sitting, standing, squatting, sleeping, stairs,  transfers, and locomotion level  PARTICIPATION LIMITATIONS: meal prep, cleaning, interpersonal relationship, driving, shopping, community activity, and occupation  PERSONAL FACTORS:  History of Lt breast cancer, GERD, hyperlipidemia,   are also affecting patient's functional outcome.   REHAB POTENTIAL: Good  CLINICAL DECISION MAKING: Stable/uncomplicated  EVALUATION COMPLEXITY: Low   GOALS: Goals reviewed with patient? Yes  SHORT TERM GOALS: (target date for Short term goals are 3 weeks  12/24/2022)   1.  Patient will demonstrate independent use of home exercise program to maintain progress from in clinic treatments.  Goal status: Met   LONG TERM GOALS: (target dates for all long term goals are 10 weeks  02/11/2023 )   1. Patient will demonstrate/report pain at worst less than or equal to 2/10 to facilitate minimal limitation in daily activity secondary to pain symptoms.  Goal status: New   2. Patient will demonstrate independent use of home exercise program to facilitate ability to maintain/progress functional gains from skilled physical therapy services.  Goal status: New   3. Patient will demonstrate FOTO outcome > or = 62 % to indicate reduced disability due to condition.  Goal status: New   4.  Patient will demonstrate Rt LE dynamometry testing with 10 % of Lt for knee extension to faciltiate usual transfers, stairs, squatting at PLOF for daily life.   Goal status: New   5.  Patient will demonstrate independent ambulation community distances > 500 ft  Goal status: New   6.  Patient will demonstrate ascending/descending stairs reciprocally s UE assist for community integration.   Goal status: New   7.  Patient will demonstrate return to work at [name].  Goal Status: New   PLAN:  PT FREQUENCY: 1-2x/week  PT DURATION: 10 weeks  PLANNED INTERVENTIONS: Therapeutic exercises, Therapeutic activity, Neuro Muscular re-education, Balance training, Gait training,  Patient/Family education, Joint mobilization, Stair training, DME instructions, Dry Needling, Electrical stimulation, Traction, Cryotherapy, vasopneumatic deviceMoist heat, Taping, Ultrasound, Ionotophoresis 4mg /ml Dexamethasone, and aquatic therapy, Manual therapy.  All included unless contraindicated  PLAN FOR NEXT SESSION: Check ROM/strength update.    Chyrel Masson, PT, DPT, OCS, ATC 12/19/22  9:17 AM

## 2022-12-19 ENCOUNTER — Encounter: Payer: Self-pay | Admitting: Rehabilitative and Restorative Service Providers"

## 2022-12-19 ENCOUNTER — Ambulatory Visit (INDEPENDENT_AMBULATORY_CARE_PROVIDER_SITE_OTHER): Payer: 59 | Admitting: Rehabilitative and Restorative Service Providers"

## 2022-12-19 DIAGNOSIS — M25561 Pain in right knee: Secondary | ICD-10-CM | POA: Diagnosis not present

## 2022-12-19 DIAGNOSIS — G8929 Other chronic pain: Secondary | ICD-10-CM

## 2022-12-19 DIAGNOSIS — R262 Difficulty in walking, not elsewhere classified: Secondary | ICD-10-CM

## 2022-12-19 DIAGNOSIS — M6281 Muscle weakness (generalized): Secondary | ICD-10-CM | POA: Diagnosis not present

## 2022-12-19 DIAGNOSIS — R600 Localized edema: Secondary | ICD-10-CM | POA: Diagnosis not present

## 2022-12-23 ENCOUNTER — Ambulatory Visit: Payer: 59 | Admitting: Rehabilitative and Restorative Service Providers"

## 2022-12-23 ENCOUNTER — Encounter: Payer: Self-pay | Admitting: Rehabilitative and Restorative Service Providers"

## 2022-12-23 DIAGNOSIS — R600 Localized edema: Secondary | ICD-10-CM

## 2022-12-23 DIAGNOSIS — M6281 Muscle weakness (generalized): Secondary | ICD-10-CM | POA: Diagnosis not present

## 2022-12-23 DIAGNOSIS — R262 Difficulty in walking, not elsewhere classified: Secondary | ICD-10-CM | POA: Diagnosis not present

## 2022-12-23 DIAGNOSIS — M25561 Pain in right knee: Secondary | ICD-10-CM

## 2022-12-23 DIAGNOSIS — G8929 Other chronic pain: Secondary | ICD-10-CM

## 2022-12-23 NOTE — Therapy (Signed)
OUTPATIENT PHYSICAL THERAPY TREATMENT NOTE   Patient Name: Mackenzie [name] MRN: [id number] DOB:25-Apr-1971, 52 y.o., female Today's Date: 12/23/2022  END OF SESSION:  PT End of Session - 12/23/22 0834     Visit Number 7    Number of Visits 20    Date for PT Re-Evaluation 02/11/23    Authorization Type [facility] [employer], deductible met    Progress Note Due on Visit 10    PT Start Time 0834    PT Stop Time 0914    PT Time Calculation (min) 40 min    Activity Tolerance Patient tolerated treatment well    Behavior During Therapy [facility] 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, left (HCC) 01/23/2017   Edema, lower extremity    GERD (gastroesophageal reflux disease)    Healthcare maintenance 03/25/2017   Hot flashes 03/2017   Hyperlipidemia 03/20/2016   Morbid obesity with body mass index (BMI) of 40.0 to 49.9 (HCC) 09/17/2017   Pre-diabetes 07/26/2018   Hgb A1C elevated on 07/26/2018, Gestational Diabetes 2012   Seasonal allergies 2012   seasonal allergies causes allergic rhinitis and itchy, dry eyes per pt   Vitamin D deficiency 05/2015   Past Surgical History:  Procedure Laterality Date   BREAST RECONSTRUCTION WITH PLACEMENT OF TISSUE EXPANDER AND FLEX HD (ACELLULAR HYDRATED DERMIS) Left 08/27/2017   Procedure: LEFT BREAST RECONSTRUCTION WITH PLACEMENT OF TISSUE EXPANDER AND FLEX HD;  Surgeon: Peggye Form, DO;  Location: [facility];  Service: Plastics;  Laterality: Left;   CESAREAN SECTION     x2   KNEE ARTHROSCOPY WITH MEDIAL MENISECTOMY Right 11/12/2022   Procedure: RIGHT KNEE ARTHROSCOPY, PARTIAL MEDIAL MENISCECTOMY;  Surgeon: Tarry Kos, MD;  Location: [facility];  Service: Orthopedics;  Laterality: Right;   LATISSIMUS FLAP TO BREAST Left 11/03/2018   LATISSIMUS FLAP TO BREAST Left 11/03/2018   Procedure: LATISSIMUS FLAP TO LEFT BREAST;  Surgeon: Peggye Form, DO;  Location: [facility];  Service: Plastics;  Laterality: Left;   MASTECTOMY MODIFIED RADICAL Left 08/27/2017   MASTECTOMY MODIFIED RADICAL Left 08/27/2017   Procedure: LEFT MODIFIED RADICAL MASTECTOMY;  Surgeon: Harriette Bouillon, MD;  Location: [facility];  Service: General;  Laterality: Left;   MASTOPEXY Right 03/03/2019   Procedure: RIGHT BREAST MASTOPEXY/REDUCTION;  Surgeon: Peggye Form, DO;  Location: [facility];  Service: Plastics;  Laterality: Right;  3 hours, please   PORT-A-CATH REMOVAL Right 08/27/2017   Procedure: REMOVAL PORT-A-CATH RIGHT CHEST;  Surgeon: Harriette Bouillon, MD;  Location: [facility];  Service: General;  Laterality: Right;   PORTACATH PLACEMENT Right 01/28/2017   Procedure: INSERTION PORT-A-CATH WITH ULTRASOUND;  Surgeon: Harriette Bouillon, MD;  Location: [facility];  Service: General;  Laterality: Right;   REDUCTION MAMMAPLASTY Right    REMOVAL OF TISSUE EXPANDER AND PLACEMENT OF IMPLANT Left 03/03/2019   Procedure: REMOVAL OF TISSUE EXPANDER AND PLACEMENT OF EXPANDER;  Surgeon: Peggye Form, DO;  Location: [facility];  Service: Plastics;  Laterality: Left;   REMOVAL OF TISSUE EXPANDER AND PLACEMENT OF IMPLANT Left 05/25/2019  Procedure: LEFT BREAST REMOVAL OF TISSUE EXPANDER AND PLACEMENT OF IMPLANT;  Surgeon: Peggye Form, DO;  Location: [facility];  Service: Plastics;  Laterality: Left;   TISSUE EXPANDER PLACEMENT Left 11/03/2018   Procedure: PLACEMENT OF TISSUE EXPANDER LEFT BREAST;  Surgeon: Peggye Form, DO;  Location: [facility];  Service: Plastics;  Laterality: Left;  Total case time is 3.5 hours   TUBAL LIGATION Bilateral 01/21/2011   Patient Active Problem List   Diagnosis Date  Noted   Acute meniscal tear, medial, right, initial encounter 11/12/2022   Plica syndrome of right knee 11/12/2022   Chondromalacia, right knee 11/12/2022   Depression 08/13/2022   BMI 40.0-44.9, adult (HCC) 08/13/2022   Obesity, Beginning BMI 08/13/2022   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   Boil of buttock, Left 12/24/2021   Hyperthyroidism 07/22/2021   Low serum thyroid stimulating hormone (TSH) 06/12/2021   Other fatigue 05/23/2021   Hemoglobin low 05/23/2021   Polyphagia 05/16/2020   Vitamin D deficiency 05/16/2020   At risk for osteoporosis 05/16/2020   Prediabetes 05/01/2020   Elevated blood pressure reading 05/01/2020   At risk for impaired metabolic function 05/01/2020   Breast asymmetry following reconstructive surgery 09/20/2019   Essential hypertension 08/17/2018   Class 3 severe obesity with serious comorbidity and body mass index (BMI) of 40.0 to 44.9 in adult [facility]) 09/17/2017   Acquired absence of left breast 09/15/2017   Malignant neoplasm of upper-inner quadrant of left breast in female, 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 MD  REFERRING PROVIDER: Cristie Hem, PA-C  REFERRING DIAG: [id number] (ICD-10-CM) - S/P right knee arthroscopy  THERAPY DIAG:  Chronic pain of right knee  Muscle weakness (generalized)  Difficulty in walking, not elsewhere classified  Localized edema  Rationale for Evaluation and Treatment: Rehabilitation  ONSET DATE: 11/12/2022 Surgery  SUBJECTIVE:  SUBJECTIVE STATEMENT: She indicated a little "nagging" pain medial aspect of knee.   Reported off and on.    PERTINENT HISTORY: History of Lt breast cancer, GERD, hyperlipidemia,   PAIN:  NPRS scale: no pain  upon arrival.  Pain location: Rt knee  Pain description: tightness, throbbing.  Aggravating factors: increased WB activity, static positioning  Relieving factors: OTC medicine  PRECAUTIONS: None  WEIGHT BEARING RESTRICTIONS: No  FALLS:  Has patient fallen in last 6 months? No  LIVING ENVIRONMENT: Lives with: lives with their family Lives in: House/apartment Stairs: bedroom on 1st floor.  Enter house:  2-3 without hand rails.  Has following equipment at home: FWW, [facility]  OCCUPATION: RN - management with desk, on floor at times.    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   MUSCLE LENGTH: 12/03/2022 No specific testing today  POSTURE:  12/03/2022 Unremarkable  PALPATION: 12/03/2022 Anterior inferior knee.   LOWER EXTREMITY ROM:   ROM Right 12/03/2022 Left 12/03/2022 Right 12/08/22 Right   Hip flexion      Hip extension  Hip abduction      Hip adduction      Hip internal rotation      Hip external rotation      Knee flexion 107 AROM in supine heel slide with pain noted 114 AROM in supine heel slide AAROM in supine 109 deg mild pain     Knee extension  -4 AROM in seated LAQ 0 AROM in seated LAQ     Ankle dorsiflexion      Ankle plantarflexion      Ankle inversion      Ankle eversion       (Blank rows = not tested)  LOWER EXTREMITY MMT:  MMT Right 12/03/2022 Left 12/03/2022 Right  12/23/2022  Hip flexion 5/5 5/5   Hip extension     Hip abduction     Hip adduction     Hip internal rotation     Hip external rotation     Knee flexion     Knee extension 5/5 42, 42.1 lbs  5/5 68, 61.4 lbs 5/5 47.7, 46 lbs  Ankle  dorsiflexion     Ankle plantarflexion     Ankle inversion     Ankle eversion      (Blank rows = not tested)  LOWER EXTREMITY SPECIAL TESTS:  12/03/2022 No specific testing  FUNCTIONAL TESTS:  12/16/2022: Rt SLS:  19 seconds   12/03/2022 18 inch chair transfer: without hands on 1st try with weight shift to Lt.  Lt SLS: 8 seconds Rt SLS: < 3 seconds  GAIT: 12/10/2022:  Able to perform safe independent ambulation in clinic on level surfaces.   12/03/2022 Mod Independent with cane in Lt UE, reduced stance on Rt LE.                                                                                                                                                                         TODAY'S TREATMENT                                                                            DATE: 12/23/2022 Ther Ex: UBE LE only lvl 3.0 8 mins, seat 12  Leg press double leg 112 lbs x 15, Rt leg only 2 x 15 56 lbs  Forward step up WB on Rt leg 2 x 15   Neuro Re-ed: Feet together stance on foam PF/DF alternating x 20 each way Tandem stance 1 min x 1 bilateral on  foam  9 inch hurdle SLS c contralateral leg step over touch and back x 15 bilaterally   TODAY'S TREATMENT                                                                            DATE: 12/18/2022 Ther Ex: UBE LE only lvl 3.0 8 mins, seat 12  Leg press double leg 100 lbs x 15, Rt leg only 2 x 15 50 lbs  Lateral step down slowly 2 x 10 , performed bilaterally TRX assist squat over chair x 10   Neuro Re-ed: Tandem ambulation fwd/back on foam in // bars 6 ft x 5 each way  9 inch hurdle step to gait pattern in // bars with occasional to moderate HHA 10 ft x 5 leading with each LE 9 inch hurdle side to side stepping in // bars with occasional to moderate HHA 10 ft x 3 each direction.   TODAY'S TREATMENT                                                                            DATE: 12/16/2022 Ther Ex: UBE LE only lvl 2.0 8 mins, seat 12  Leg press  double leg 100 lbs x 15, Rt leg only 2 x 10 50 lbs  Step on over and down 4 inch step x 10 WB on Rt leg with single hand rail assist Lateral step down slowly WB on Rt leg x 15 Sit to stand to sit x 15 no UE assist.    Neuro Re-ed: SLS on black mat with contralateral leg corner touching lightly x 8 each, performed bilaterally with occasional HHA on bar  TODAY'S TREATMENT                                                                            DATE: 12/10/2022 Ther Ex: UBE LE only lvl 2.0 8 mins, seat 12  Leg press double leg 100 lbs x 15, Rt leg only 2 x 15 37 lbs  Seated Rt quad set 5 sec hold x 10 Seated Rt quad set c SLR 2 x 10   Neuro Re-ed: Tandem stance on foam 1 min x 2 bilateral SLS with 6 inch hurdle clear contralateral leg x 15 bilateral with occasional HHA as needed.    PATIENT EDUCATION:  Education details: rationale for interventions Person educated: Patient Education method: Explanation, Demonstration, Verbal cues, and Handouts Education comprehension: verbalized understanding, returned demonstration, and verbal cues required  HOME EXERCISE PROGRAM: Access Code: [email or web address] URL: [email or web address] Date: 12/03/2022 Prepared by: Chyrel Masson  Exercises - Supine Heel Slide  - 1-2 x daily - 7 x weekly -  1 sets - 10 reps - 2 hold - Supine Quadricep Sets  - 1-2 x daily - 7 x weekly - 1 sets - 10 reps - 5 hold - Seated Long Arc Quad  - 3-5 x daily - 7 x weekly - 1-2 sets - 10 reps - 2 hold - Seated Quad Set  - 3-5 x daily - 7 x weekly - 1 sets - 10 reps - 5 hold - Seated Straight Leg Heel Taps  - 1-2 x daily - 7 x weekly - 3 sets - 10 reps - Sit to Stand  - 3 x daily - 7 x weekly - 1 sets - 10 reps  ASSESSMENT:  CLINICAL IMPRESSION: Strength retested showed mild improvements in Rt leg strength but continued gains to continue to help ambulation and other functional activity.  Pt to continue to benefit from progressive strengthening program.      OBJECTIVE IMPAIRMENTS: Abnormal gait, decreased activity tolerance, decreased balance, decreased coordination, decreased endurance, decreased mobility, difficulty walking, decreased ROM, decreased strength, increased edema, increased fascial restrictions, impaired perceived functional ability, impaired flexibility, improper body mechanics, and pain.   ACTIVITY LIMITATIONS: carrying, lifting, bending, sitting, standing, squatting, sleeping, stairs, transfers, and locomotion level  PARTICIPATION LIMITATIONS: meal prep, cleaning, interpersonal relationship, driving, shopping, community activity, and occupation  PERSONAL FACTORS:  History of Lt breast cancer, GERD, hyperlipidemia,   are also affecting patient's functional outcome.   REHAB POTENTIAL: Good  CLINICAL DECISION MAKING: Stable/uncomplicated  EVALUATION COMPLEXITY: Low   GOALS: Goals reviewed with patient? Yes  SHORT TERM GOALS: (target date for Short term goals are 3 weeks  12/24/2022)   1.  Patient will demonstrate independent use of home exercise program to maintain progress from in clinic treatments.  Goal status: Met   LONG TERM GOALS: (target dates for all long term goals are 10 weeks  02/11/2023 )   1. Patient will demonstrate/report pain at worst less than or equal to 2/10 to facilitate minimal limitation in daily activity secondary to pain symptoms.  Goal status: on going 12/23/2022   2. Patient will demonstrate independent use of home exercise program to facilitate ability to maintain/progress functional gains from skilled physical therapy services.  Goal status: on going 12/23/2022   3. Patient will demonstrate FOTO outcome > or = 62 % to indicate reduced disability due to condition.  Goal status: on going 12/23/2022   4.  Patient will demonstrate Rt LE dynamometry testing with 10 % of Lt for knee extension to faciltiate usual transfers, stairs, squatting at PLOF for daily life.   Goal status: on going  12/23/2022   5.  Patient will demonstrate independent ambulation community distances > 500 ft  Goal status: on going 12/23/2022   6.  Patient will demonstrate ascending/descending stairs reciprocally s UE assist for community integration.   Goal status: on going 12/23/2022   7.  Patient will demonstrate return to work at [name].  Goal Status: on going 12/23/2022   PLAN:  PT FREQUENCY: 1-2x/week  PT DURATION: 10 weeks  PLANNED INTERVENTIONS: Therapeutic exercises, Therapeutic activity, Neuro Muscular re-education, Balance training, Gait training, Patient/Family education, Joint mobilization, Stair training, DME instructions, Dry Needling, Electrical stimulation, Traction, Cryotherapy, vasopneumatic deviceMoist heat, Taping, Ultrasound, Ionotophoresis 4mg /ml Dexamethasone, and aquatic therapy, Manual therapy.  All included unless contraindicated  PLAN FOR NEXT SESSION:  Possible FOTO update (approaching 30 days).  Eccentric LAQ machine possible.    Chyrel Masson, PT, DPT, OCS, ATC 12/23/22  9:12 AM

## 2022-12-24 ENCOUNTER — Ambulatory Visit (INDEPENDENT_AMBULATORY_CARE_PROVIDER_SITE_OTHER): Payer: 59 | Admitting: Adult Health

## 2022-12-24 ENCOUNTER — Encounter (INDEPENDENT_AMBULATORY_CARE_PROVIDER_SITE_OTHER): Payer: Self-pay | Admitting: Adult Health

## 2022-12-24 VITALS — BP 104/69 | HR 64 | Temp 98.0°F | Ht 65.0 in | Wt 250.0 lb

## 2022-12-24 DIAGNOSIS — E669 Obesity, unspecified: Secondary | ICD-10-CM | POA: Diagnosis not present

## 2022-12-24 DIAGNOSIS — Z6841 Body Mass Index (BMI) 40.0 and over, adult: Secondary | ICD-10-CM | POA: Diagnosis not present

## 2022-12-24 DIAGNOSIS — Z9889 Other specified postprocedural states: Secondary | ICD-10-CM | POA: Diagnosis not present

## 2022-12-24 DIAGNOSIS — I10 Essential (primary) hypertension: Secondary | ICD-10-CM | POA: Diagnosis not present

## 2022-12-24 NOTE — Progress Notes (Signed)
WEIGHT SUMMARY AND BIOMETRICS  Vitals Temp: 98 F (36.7 C) BP: 104/69 Pulse Rate: 64 SpO2: 96 %   Anthropometric Measurements Height: 5\' 5"  (1.651 m) Weight: 250 lb (113.4 kg) BMI (Calculated): 41.6 Weight at Last Visit: 245lb Weight Lost Since Last Visit: 0 Weight Gained Since Last Visit: 5lb Starting Weight: 267lb Total Weight Loss (lbs): 17 lb (7.711 kg)   Body Composition  Body Fat %: 48.1 % Fat Mass (lbs): 120.4 lbs Muscle Mass (lbs): 123.2 lbs Total Body Water (lbs): 85.4 lbs Visceral Fat Rating : 15   Other Clinical Data Fasting: Yes Labs: no Today's Visit #: 78 Starting Date: 08/09/19    Chief Complaint:   OBESITY Mackenzie [name] is here to discuss her progress with her obesity treatment plan. She is on the the Category 3 Plan and states she is following her eating plan approximately 70 % of the time. She states she is exercising OutPatient PT 60 minutes 2 times per week.   Interim History:  Ms. Dumler has started a hybrid working plan- 2 days on hospital and 3 days remote.  She is still not driving, however hopes to start by weeks end.  She is intermittently using cane when walking.  She reports R knee stable, minimal pain- current;ly rated 2/10 and described as a dull ache.  She has not used Narcotics in weeks, only NSAIDs (800mg  Ibuprofen) 1-2 doses per week.  When working from home she feels that she is snacking more.  Subjective:   1. Essential hypertension BP stable, yet soft She denies sx's of hypotension. She will occasionally check BP at work- SBP 110-120 DBP 60-70 She is currently on: metoprolol succinate (TOPROL-XL) 50 MG 24 hr tablet 1/2 tab QHS  losartan (COZAAR) 50 MG tablet  spironolactone (ALDACTONE) 25 MG tablet  rosuvastatin (CRESTOR) 10 MG tablet   2. S/P right knee arthroscopy 11/12/2022  DESCRIPTION OF PROCEDURE: Ms. Petrovich is a 52 y.o.-year-old female with above mentioned conditions. Full discussion held regarding  risks benefits alternatives and complications related surgical intervention. Conservative care options reviewed. All questions answered.   The patient was identified in the preoperative holding area and the operative extremity was marked. The patient was brought to the operating room and transferred to operating table in a supine position. Satisfactory general anesthesia was induced by anesthesiology.     Standard anterolateral, anteromedial arthroscopy portals were obtained. The anteromedial portal was obtained with a spinal needle for localization under direct visualization with subsequent diagnostic findings.    We first addressed the medial compartment by placing a valgus force on the knee.  Focal area of grade III chondromalacia of the medial femoral condyle in the weightbearing surface.  This measured approximately a centimeter by half a centimeter.  Chondroplasty was performed back to stable margins.  There is no full-thickness chondral defects.  The medial tibial plateau had a small circular area of grade III chondromalacia that was less than 5 mm in diameter.  No unstable chondral surfaces.  Medial meniscus was then evaluated with a probe.  Radial tear of the meniscal root was encountered.  The posterior horn was probed and evaluated did not demonstrate any translation.  The displaced edge of the radial tear was debrided back to stable margins.  The cruciates were then evaluated and were unremarkable.  Varus force was placed on the knee to evaluate the lateral compartment.  There was some degenerative fraying of the lateral meniscus without any overt tears.  The chondral surfaces were  unremarkable.  The knee was then placed into full extension.  There was a thickened medial plica that corresponded to abraded cartilage at the medial femoral condyle.  The medial plica was resected with the oscillating shaver.  The femoral trochlea and the patella both exhibited grade III chondromalacia.  Gentle  chondroplasty was performed there.   Gutters were checked for loose bodies.  Excess fluid was removed from the knee joint.  Incisions were closed with interrupted nylon sutures.  Sterile dressings were applied.  Patient tolerated procedure well had no immediate complications.  Assessment/Plan:   1. Essential hypertension Remain well hydrated Monitor for sx's of hypotension  2. S/P right knee arthroscopy Continue with OutPatient PT Continue with home exercises Increase protein intake  3. Obesity, Current BMI 41.6  Mackenzie [name] is currently in the action stage of change. As such, her goal is to continue with weight loss efforts. She has agreed to the Category 3 Plan.   Exercise goals: All adults should avoid inactivity. Some physical activity is better than none, and adults who participate in any amount of physical activity gain some health benefits. Adults should also include muscle-strengthening activities that involve all major muscle groups on 2 or more days a week.  Behavioral modification strategies: increasing lean protein intake, decreasing simple carbohydrates, increasing vegetables, increasing water intake, meal planning and cooking strategies, keeping healthy foods in the home, better snacking choices, and planning for success.  Mackenzie [name] has agreed to follow-up with our clinic in 4 weeks. She was informed of the importance of frequent follow-up visits to maximize her success with intensive lifestyle modifications for her multiple health conditions.   Objective:   Blood pressure 104/69, pulse 64, temperature 98 F (36.7 C), height 5\' 5"  (1.651 m), weight 250 lb (113.4 kg), last menstrual period 11/28/2016, SpO2 96%, unknown if currently breastfeeding. Body mass index is 41.6 kg/m.  General: Cooperative, alert, well developed, in no acute distress. HEENT: Conjunctivae and lids unremarkable. Cardiovascular: Regular rhythm.  Lungs: Normal work of breathing. Neurologic: No focal  deficits.   Lab Results  Component Value Date   CREATININE 0.79 11/10/2022   BUN 21 (H) 11/10/2022   NA 137 11/10/2022   K 5.2 (H) 11/10/2022   CL 102 11/10/2022   CO2 24 11/10/2022   Lab Results  Component Value Date   ALT 20 09/03/2022   AST 21 09/03/2022   GGT 19 03/01/2021   ALKPHOS 129 (H) 09/03/2022   BILITOT 0.3 09/03/2022   Lab Results  Component Value Date   HGBA1C 5.8 (H) 09/03/2022   HGBA1C 5.4 01/29/2022   HGBA1C 5.4 12/19/2021   HGBA1C 5.4 05/23/2021   HGBA1C 5.4 02/27/2021   Lab Results  Component Value Date   INSULIN 13.1 09/03/2022   INSULIN 21.3 01/29/2022   INSULIN 18.2 01/07/2022   INSULIN 15.1 05/23/2021   INSULIN 15.1 02/27/2021   Lab Results  Component Value Date   TSH 4.94 07/14/2022   Lab Results  Component Value Date   CHOL 202 (H) 11/15/2020   HDL 58 11/15/2020   LDLCALC 135 (H) 11/15/2020   TRIG 52 11/15/2020   CHOLHDL 3.5 11/15/2020   Lab Results  Component Value Date   VD25OH 81.2 09/03/2022   VD25OH 50.2 01/29/2022   VD25OH 49.2 12/19/2021   Lab Results  Component Value Date   WBC 3.8 (L) 07/23/2021   HGB 11.5 (L) 07/23/2021   HCT 35.9 (L) 07/23/2021   MCV 79.6 07/23/2021   PLT 201.0 07/23/2021  No results found for: "IRON", "TIBC", "FERRITIN"  Attestation Statements:   Reviewed by clinician on day of visit: allergies, medications, problem list, medical history, surgical history, family history, social history, and previous encounter notes.  Time spent on visit including pre-visit chart review and post-visit care and charting was 27 minutes.   I have reviewed the above documentation for accuracy and completeness, and I agree with the above. -  Karolynn Infantino d. Naba Sneed, NP-C

## 2022-12-25 ENCOUNTER — Encounter: Payer: Self-pay | Admitting: Rehabilitative and Restorative Service Providers"

## 2022-12-25 ENCOUNTER — Ambulatory Visit (INDEPENDENT_AMBULATORY_CARE_PROVIDER_SITE_OTHER): Payer: 59 | Admitting: Rehabilitative and Restorative Service Providers"

## 2022-12-25 ENCOUNTER — Ambulatory Visit (INDEPENDENT_AMBULATORY_CARE_PROVIDER_SITE_OTHER): Payer: 59 | Admitting: Orthopaedic Surgery

## 2022-12-25 ENCOUNTER — Encounter: Payer: Self-pay | Admitting: Orthopaedic Surgery

## 2022-12-25 DIAGNOSIS — G8929 Other chronic pain: Secondary | ICD-10-CM | POA: Diagnosis not present

## 2022-12-25 DIAGNOSIS — M6751 Plica syndrome, right knee: Secondary | ICD-10-CM

## 2022-12-25 DIAGNOSIS — M25561 Pain in right knee: Secondary | ICD-10-CM

## 2022-12-25 DIAGNOSIS — M6281 Muscle weakness (generalized): Secondary | ICD-10-CM | POA: Diagnosis not present

## 2022-12-25 DIAGNOSIS — M94261 Chondromalacia, right knee: Secondary | ICD-10-CM

## 2022-12-25 DIAGNOSIS — R262 Difficulty in walking, not elsewhere classified: Secondary | ICD-10-CM

## 2022-12-25 DIAGNOSIS — S83241A Other tear of medial meniscus, current injury, right knee, initial encounter: Secondary | ICD-10-CM

## 2022-12-25 DIAGNOSIS — R600 Localized edema: Secondary | ICD-10-CM | POA: Diagnosis not present

## 2022-12-25 NOTE — Therapy (Signed)
OUTPATIENT PHYSICAL THERAPY TREATMENT NOTE   Patient Name: Mackenzie [name] MRN: [id number] DOB:November 15, 1970, 52 y.o., female Today's Date: 12/25/2022  END OF SESSION:  PT End of Session - 12/25/22 0941     Visit Number 8    Number of Visits 20    Date for PT Re-Evaluation 02/11/23    Authorization Type [facility] [employer], deductible met    Progress Note Due on Visit 10    PT Start Time 1006    PT Stop Time 1055    PT Time Calculation (min) 49 min    Activity Tolerance Patient limited by pain    Behavior During Therapy [facility] for tasks assessed/performed                    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   Allergic rhinitis 2012   Anemia 01/27/2017   prior to starting chemotherapy   Breast cancer (HCC) 01/14/2017   Left breast   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, left (HCC) 01/23/2017   Edema, lower extremity    GERD (gastroesophageal reflux disease)    Healthcare maintenance 03/25/2017   Hot flashes 03/2017   Hyperlipidemia 03/20/2016   Morbid obesity with body mass index (BMI) of 40.0 to 49.9 (HCC) 09/17/2017   Pre-diabetes 07/26/2018   Hgb A1C elevated on 07/26/2018, Gestational Diabetes 2012   Seasonal allergies 2012   seasonal allergies causes allergic rhinitis and itchy, dry eyes per pt   Vitamin D deficiency 05/2015   Past Surgical History:  Procedure Laterality Date   BREAST RECONSTRUCTION WITH PLACEMENT OF TISSUE EXPANDER AND FLEX HD (ACELLULAR HYDRATED DERMIS) Left 08/27/2017   Procedure: LEFT BREAST RECONSTRUCTION WITH PLACEMENT OF TISSUE EXPANDER AND FLEX HD;  Surgeon: Peggye Form, DO;  [facility];  Service: Plastics;  Laterality: Left;   CESAREAN SECTION     x2   KNEE ARTHROSCOPY WITH MEDIAL MENISECTOMY Right 11/12/2022   Procedure: RIGHT KNEE ARTHROSCOPY, PARTIAL MEDIAL MENISCECTOMY;  Surgeon: Tarry Kos, MD;  Location: [facility];   Service: Orthopedics;  Laterality: Right;   LATISSIMUS FLAP TO BREAST Left 11/03/2018   LATISSIMUS FLAP TO BREAST Left 11/03/2018   Procedure: LATISSIMUS FLAP TO LEFT BREAST;  Surgeon: Peggye Form, DO;  [facility];  Service: Plastics;  Laterality: Left;   MASTECTOMY MODIFIED RADICAL Left 08/27/2017   MASTECTOMY MODIFIED RADICAL Left 08/27/2017   Procedure: LEFT MODIFIED RADICAL MASTECTOMY;  Surgeon: Harriette Bouillon, MD;  [facility];  Service: General;  Laterality: Left;   MASTOPEXY Right 03/03/2019   Procedure: RIGHT BREAST MASTOPEXY/REDUCTION;  Surgeon: Peggye Form, DO;  Location: [facility];  Service: Plastics;  Laterality: Right;  3 hours, please   PORT-A-CATH REMOVAL Right 08/27/2017   Procedure: REMOVAL PORT-A-CATH RIGHT CHEST;  Surgeon: Harriette Bouillon, MD;  [facility];  Service: General;  Laterality: Right;   PORTACATH PLACEMENT Right 01/28/2017   Procedure: INSERTION PORT-A-CATH WITH ULTRASOUND;  Surgeon: Harriette Bouillon, MD;  Location: [facility];  Service: General;  Laterality: Right;   REDUCTION MAMMAPLASTY Right    REMOVAL OF TISSUE EXPANDER AND PLACEMENT OF IMPLANT Left 03/03/2019   Procedure: REMOVAL OF TISSUE EXPANDER AND PLACEMENT OF EXPANDER;  Surgeon: Peggye Form, DO;  Location: [facility];  Service: Plastics;  Laterality: Left;   REMOVAL OF TISSUE EXPANDER AND PLACEMENT OF IMPLANT Left 05/25/2019  Procedure: LEFT BREAST REMOVAL OF TISSUE EXPANDER AND PLACEMENT OF IMPLANT;  Surgeon: Peggye Form, DO;  Location: [facility];  Service: Plastics;  Laterality: Left;   TISSUE EXPANDER PLACEMENT Left 11/03/2018   Procedure: PLACEMENT OF TISSUE EXPANDER LEFT BREAST;  Surgeon: Peggye Form, DO;  [facility];  Service: Plastics;  Laterality: Left;  Total case time is 3.5 hours   TUBAL LIGATION Bilateral 01/21/2011   Patient Active Problem List   Diagnosis Date Noted    Acute meniscal tear, medial, right, initial encounter 11/12/2022   Plica syndrome of right knee 11/12/2022   Chondromalacia, right knee 11/12/2022   Depression 08/13/2022   BMI 40.0-44.9, adult (HCC) 08/13/2022   Obesity, Beginning BMI 08/13/2022   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   Boil of buttock, Left 12/24/2021   Hyperthyroidism 07/22/2021   Low serum thyroid stimulating hormone (TSH) 06/12/2021   Other fatigue 05/23/2021   Hemoglobin low 05/23/2021   Polyphagia 05/16/2020   Vitamin D deficiency 05/16/2020   At risk for osteoporosis 05/16/2020   Prediabetes 05/01/2020   Elevated blood pressure reading 05/01/2020   At risk for impaired metabolic function 05/01/2020   Breast asymmetry following reconstructive surgery 09/20/2019   Essential hypertension 08/17/2018   Class 3 severe obesity with serious comorbidity and body mass index (BMI) of 40.0 to 44.9 in adult [facility]) 09/17/2017   Acquired absence of left breast 09/15/2017   Malignant neoplasm of upper-inner quadrant of left breast in female, 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 MD  REFERRING PROVIDER: Cristie Hem, PA-C  REFERRING DIAG: [id number] (ICD-10-CM) - S/P right knee arthroscopy  THERAPY DIAG:  Chronic pain of right knee  Muscle weakness (generalized)  Difficulty in walking, not elsewhere classified  Localized edema  Rationale for Evaluation and Treatment: Rehabilitation  ONSET DATE: 11/12/2022 Surgery  SUBJECTIVE:  SUBJECTIVE STATEMENT: Pt indicated having swelling /fluid on knee that was noted with MD office visit today.  Reported a little pain today upon arrival.  Reported she had option to drain fluid but deferred. She reported doing  back to back work days this week that might have been much per her report.    PERTINENT HISTORY: History of Lt breast cancer, GERD, hyperlipidemia,   PAIN:  NPRS scale: 1-2/10 Pain location: Rt knee  Pain description: tightness, throbbing.  Aggravating factors: increased WB activity, static positioning  Relieving factors: OTC medicine  PRECAUTIONS: None  WEIGHT BEARING RESTRICTIONS: No  FALLS:  Has patient fallen in last 6 months? No  LIVING ENVIRONMENT: Lives with: lives with their family Lives in: House/apartment Stairs: bedroom on 1st floor.  Enter house:  2-3 without hand rails.  Has following equipment at home: FWW, [facility]  OCCUPATION: RN - management with desk, on floor at times.    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   MUSCLE LENGTH: 12/03/2022 No specific testing today  POSTURE:  12/03/2022 Unremarkable  PALPATION: 12/03/2022 Anterior  inferior knee.   LOWER EXTREMITY ROM:   ROM Right 12/03/2022 Left 12/03/2022 Right 12/08/22 Right   Hip flexion      Hip extension      Hip abduction      Hip adduction      Hip internal rotation      Hip external rotation      Knee flexion 107 AROM in supine heel slide with pain noted 114 AROM in supine heel slide AAROM in supine 109 deg mild pain     Knee extension  -4 AROM in seated LAQ 0 AROM in seated LAQ     Ankle dorsiflexion      Ankle plantarflexion      Ankle inversion      Ankle eversion       (Blank rows = not tested)  LOWER EXTREMITY MMT:  MMT Right 12/03/2022 Left 12/03/2022 Right  12/23/2022  Hip flexion 5/5 5/5   Hip extension     Hip abduction     Hip  adduction     Hip internal rotation     Hip external rotation     Knee flexion     Knee extension 5/5 42, 42.1 lbs  5/5 68, 61.4 lbs 5/5 47.7, 46 lbs  Ankle dorsiflexion     Ankle plantarflexion     Ankle inversion     Ankle eversion      (Blank rows = not tested)  LOWER EXTREMITY SPECIAL TESTS:  12/03/2022 No specific testing  FUNCTIONAL TESTS:  12/16/2022: Rt SLS:  19 seconds   12/03/2022 18 inch chair transfer: without hands on 1st try with weight shift to Lt.  Lt SLS: 8 seconds Rt SLS: < 3 seconds  GAIT: 12/10/2022:  Able to perform safe independent ambulation in clinic on level surfaces.   12/03/2022 Mod Independent with cane in Lt UE, reduced stance on Rt LE.                                                                                                                                                                         TODAY'S TREATMENT                                                                            DATE: 12/25/2022 Ther Ex: Nustep Lvl 6 10.5 mins for ROM Standing gastroc stretch on board 30 sec x 5  Leg press double leg 112 lbs x 15, Rt leg  only 2 x 15 56 lbs  Seated SLR Rt 2 x 10  Seated LAQ 5 lbs 2 x 15  Supine LAQ Rt leg in 90 deg hip flexion x 15 for swelling/edema  Discussion about edema reduction techniques including HEP, propping legs above heart, ankle pumps, etc especially after longer periods of time of sitting/standing/walking when fluid buildup may be more noted.   Vasopneumatic Device Rt leg in supine elevation 34 deg medium compression 10 mins   TODAY'S TREATMENT                                                                            DATE: 12/23/2022 Ther Ex: UBE LE only lvl 3.0 8 mins, seat 12  Leg press double leg 112 lbs x 15, Rt leg only 2 x 15 56 lbs  Forward step up WB on Rt leg 2 x 15   Neuro Re-ed: Feet together stance on foam PF/DF alternating x 20 each way Tandem stance 1 min x 1 bilateral on foam  9 inch hurdle SLS c  contralateral leg step over touch and back x 15 bilaterally   TODAY'S TREATMENT                                                                            DATE: 12/18/2022 Ther Ex: UBE LE only lvl 3.0 8 mins, seat 12  Leg press double leg 100 lbs x 15, Rt leg only 2 x 15 50 lbs  Lateral step down slowly 2 x 10 , performed bilaterally TRX assist squat over chair x 10   Neuro Re-ed: Tandem ambulation fwd/back on foam in // bars 6 ft x 5 each way  9 inch hurdle step to gait pattern in // bars with occasional to moderate HHA 10 ft x 5 leading with each LE 9 inch hurdle side to side stepping in // bars with occasional to moderate HHA 10 ft x 3 each direction.   TODAY'S TREATMENT                                                                            DATE: 12/16/2022 Ther Ex: UBE LE only lvl 2.0 8 mins, seat 12  Leg press double leg 100 lbs x 15, Rt leg only 2 x 10 50 lbs  Step on over and down 4 inch step x 10 WB on Rt leg with single hand rail assist Lateral step down slowly WB on Rt leg x 15 Sit to stand to sit x 15 no UE assist.    Neuro Re-ed: SLS on black mat with contralateral leg corner touching lightly x 8 each, performed bilaterally  with occasional HHA on bar  PATIENT EDUCATION:  Education details: rationale for interventions Person educated: Patient Education method: Explanation, Demonstration, Verbal cues, and Handouts Education comprehension: verbalized understanding, returned demonstration, and verbal cues required  HOME EXERCISE PROGRAM: Access Code: [email or web address] URL: [email or web address] Date: 12/03/2022 Prepared by: Chyrel Masson  Exercises - Supine Heel Slide  - 1-2 x daily - 7 x weekly - 1 sets - 10 reps - 2 hold - Supine Quadricep Sets  - 1-2 x daily - 7 x weekly - 1 sets - 10 reps - 5 hold - Seated Long Arc Quad  - 3-5 x daily - 7 x weekly - 1-2 sets - 10 reps - 2 hold - Seated Quad Set  - 3-5 x daily - 7 x weekly - 1 sets - 10 reps - 5 hold -  Seated Straight Leg Heel Taps  - 1-2 x daily - 7 x weekly - 3 sets - 10 reps - Sit to Stand  - 3 x daily - 7 x weekly - 1 sets - 10 reps  ASSESSMENT:  CLINICAL IMPRESSION: Discussion about edema reduction techniques including HEP, propping legs above heart, ankle pumps, etc especially after longer periods of time of sitting/standing/walking when fluid buildup may be more noted.   Continued strengthening encouraged as tolerated and monitor symptoms.    OBJECTIVE IMPAIRMENTS: Abnormal gait, decreased activity tolerance, decreased balance, decreased coordination, decreased endurance, decreased mobility, difficulty walking, decreased ROM, decreased strength, increased edema, increased fascial restrictions, impaired perceived functional ability, impaired flexibility, improper body mechanics, and pain.   ACTIVITY LIMITATIONS: carrying, lifting, bending, sitting, standing, squatting, sleeping, stairs, transfers, and locomotion level  PARTICIPATION LIMITATIONS: meal prep, cleaning, interpersonal relationship, driving, shopping, community activity, and occupation  PERSONAL FACTORS:  History of Lt breast cancer, GERD, hyperlipidemia,   are also affecting patient's functional outcome.   REHAB POTENTIAL: Good  CLINICAL DECISION MAKING: Stable/uncomplicated  EVALUATION COMPLEXITY: Low   GOALS: Goals reviewed with patient? Yes  SHORT TERM GOALS: (target date for Short term goals are 3 weeks  12/24/2022)   1.  Patient will demonstrate independent use of home exercise program to maintain progress from in clinic treatments.  Goal status: Met   LONG TERM GOALS: (target dates for all long term goals are 10 weeks  02/11/2023 )   1. Patient will demonstrate/report pain at worst less than or equal to 2/10 to facilitate minimal limitation in daily activity secondary to pain symptoms.  Goal status: on going 12/23/2022   2. Patient will demonstrate independent use of home exercise program to facilitate  ability to maintain/progress functional gains from skilled physical therapy services.  Goal status: on going 12/23/2022   3. Patient will demonstrate FOTO outcome > or = 62 % to indicate reduced disability due to condition.  Goal status: on going 12/23/2022   4.  Patient will demonstrate Rt LE dynamometry testing with 10 % of Lt for knee extension to faciltiate usual transfers, stairs, squatting at PLOF for daily life.   Goal status: on going 12/23/2022   5.  Patient will demonstrate independent ambulation community distances > 500 ft  Goal status: on going 12/23/2022   6.  Patient will demonstrate ascending/descending stairs reciprocally s UE assist for community integration.   Goal status: on going 12/23/2022   7.  Patient will demonstrate return to work at [name].  Goal Status: on going 12/23/2022   PLAN:  PT FREQUENCY: 1-2x/week  PT DURATION: 10 weeks  PLANNED INTERVENTIONS: Therapeutic exercises, Therapeutic activity,  Neuro Muscular re-education, Balance training, Gait training, Patient/Family education, Joint mobilization, Stair training, DME instructions, Dry Needling, Electrical stimulation, Traction, Cryotherapy, vasopneumatic deviceMoist heat, Taping, Ultrasound, Ionotophoresis 4mg /ml Dexamethasone, and aquatic therapy, Manual therapy.  All included unless contraindicated  PLAN FOR NEXT SESSION:  Possible FOTO update as able.  Vaso use again?    Chyrel Masson, PT, DPT, OCS, ATC 12/25/22  10:47 AM

## 2022-12-25 NOTE — Progress Notes (Signed)
Post-Op Visit Note   Patient: Mackenzie [name]           Date of Birth: Dec 24, 1970           MRN: [id number] Visit Date: 12/25/2022 PCP: Dois Davenport, MD   Assessment & Plan:  Chief Complaint:  Chief Complaint  Patient presents with   Right Knee - Follow-up    Right knee scope 11/12/2022   Visit Diagnoses:  1. Acute meniscal tear, medial, right, initial encounter   2. Chondromalacia, right knee   3. Plica syndrome of right knee     Plan: Ms. Fairbairn is a 52 year old female 6-week status post right knee scope.  She feels that things are improving overall but still feels little bit of weakness in the knee.  She is using a cane for ambulating long distances.  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  She still doing physical therapy.  Examination of the right knee shows fully healed surgical scars.  Range of motion is progressing nicely.  She has a little bit of an effusion.  No joint line tenderness.  Based on findings I feel that the weakness symptoms that she is feeling is probably due to the effusion.  I offered a knee aspiration cortisone injection but she would like to try oral NSAIDs for now.  She will pick up a knee compression sleeve as well.  Will continue the work note to work from home as needed for the next 6 weeks.  Recheck in 6 weeks.  Follow-Up Instructions: Return in about 6 weeks (around 02/05/2023).   Orders:  No orders of the defined types were placed in this encounter.  No orders of the defined types were placed in this encounter.   Imaging: No results found.  PMFS History: Patient Active Problem List   Diagnosis Date Noted   Acute meniscal tear, medial, right, initial encounter 11/12/2022   Plica syndrome of right knee 11/12/2022   Chondromalacia, right knee 11/12/2022   Depression 08/13/2022   BMI 40.0-44.9, adult (HCC) 08/13/2022   Obesity, Beginning BMI 08/13/2022   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    Boil of buttock, Left 12/24/2021   Hyperthyroidism 07/22/2021   Low serum thyroid stimulating hormone (TSH) 06/12/2021   Other fatigue 05/23/2021   Hemoglobin low 05/23/2021   Polyphagia 05/16/2020   Vitamin D deficiency 05/16/2020   At risk for osteoporosis 05/16/2020   Prediabetes 05/01/2020   Elevated blood pressure reading 05/01/2020   At risk for impaired metabolic function 05/01/2020   Breast asymmetry following reconstructive surgery 09/20/2019   Essential hypertension 08/17/2018   Class 3 severe obesity with serious comorbidity and body mass index (BMI) of 40.0 to 44.9 in adult [facility]) 09/17/2017   Acquired absence of left breast 09/15/2017   Malignant neoplasm of upper-inner quadrant of left breast in female, 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   Healthcare maintenance 03/25/2017   Malignant 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, estrogen receptor positive (HCC) 01/23/2017   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, left (HCC) 01/23/2017   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   Allergic rhinitis 2012   Anemia 01/27/2017   prior to starting chemotherapy   Breast cancer (HCC) 01/14/2017   Left breast   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, left (HCC) 01/23/2017   Edema, lower extremity  GERD (gastroesophageal reflux disease)    Healthcare maintenance 03/25/2017   Hot flashes 03/2017   Hyperlipidemia 03/20/2016   Morbid obesity with body mass index (BMI) of 40.0 to 49.9 (HCC) 09/17/2017   Pre-diabetes 07/26/2018   Hgb A1C elevated on 07/26/2018, Gestational Diabetes 2012   Seasonal allergies 2012   seasonal allergies causes allergic rhinitis and itchy, dry eyes per pt   Vitamin D deficiency 05/2015    Family History  Problem Relation Age of Onset   Other Mother        d.56 from complications of surgery to remove brain tumor   Diabetes Mother     Hypertension Mother    Stroke Mother    Kidney disease Father    Breast cancer Paternal Grandmother 15       d.60s from breast cancer. Did not have treatment.   Hypertension Other    Diabetes Other    Stroke Other     Past Surgical History:  Procedure Laterality Date   BREAST RECONSTRUCTION WITH PLACEMENT OF TISSUE EXPANDER AND FLEX HD (ACELLULAR HYDRATED DERMIS) Left 08/27/2017   Procedure: LEFT BREAST RECONSTRUCTION WITH PLACEMENT OF TISSUE EXPANDER AND FLEX HD;  Surgeon: Peggye Form, DO;  [facility];  Service: Plastics;  Laterality: Left;   CESAREAN SECTION     x2   KNEE ARTHROSCOPY WITH MEDIAL MENISECTOMY Right 11/12/2022   Procedure: RIGHT KNEE ARTHROSCOPY, PARTIAL MEDIAL MENISCECTOMY;  Surgeon: Tarry Kos, MD;  Location: [facility];  Service: Orthopedics;  Laterality: Right;   LATISSIMUS FLAP TO BREAST Left 11/03/2018   LATISSIMUS FLAP TO BREAST Left 11/03/2018   Procedure: LATISSIMUS FLAP TO LEFT BREAST;  Surgeon: Peggye Form, DO;  [facility];  Service: Plastics;  Laterality: Left;   MASTECTOMY MODIFIED RADICAL Left 08/27/2017   MASTECTOMY MODIFIED RADICAL Left 08/27/2017   Procedure: LEFT MODIFIED RADICAL MASTECTOMY;  Surgeon: Harriette Bouillon, MD;  [facility]: [facility];  Service: General;  Laterality: Left;   MASTOPEXY Right 03/03/2019   Procedure: RIGHT BREAST MASTOPEXY/REDUCTION;  Surgeon: Peggye Form, DO;  Location: [facility];  Service: Plastics;  Laterality: Right;  3 hours, please   PORT-A-CATH REMOVAL Right 08/27/2017   Procedure: REMOVAL PORT-A-CATH RIGHT CHEST;  Surgeon: Harriette Bouillon, MD;  [facility];  Service: General;  Laterality: Right;   PORTACATH PLACEMENT Right 01/28/2017   Procedure: INSERTION PORT-A-CATH WITH ULTRASOUND;  Surgeon: Harriette Bouillon, MD;  Location: [facility];  Service: General;  Laterality: Right;   REDUCTION MAMMAPLASTY Right    REMOVAL OF TISSUE EXPANDER AND  PLACEMENT OF IMPLANT Left 03/03/2019   Procedure: REMOVAL OF TISSUE EXPANDER AND PLACEMENT OF EXPANDER;  Surgeon: Peggye Form, DO;  Location: [facility];  Service: Plastics;  Laterality: Left;   REMOVAL OF TISSUE EXPANDER AND PLACEMENT OF IMPLANT Left 05/25/2019   Procedure: LEFT BREAST REMOVAL OF TISSUE EXPANDER AND PLACEMENT OF IMPLANT;  Surgeon: Peggye Form, DO;  Location: [facility];  Service: Plastics;  Laterality: Left;   TISSUE EXPANDER PLACEMENT Left 11/03/2018   Procedure: PLACEMENT OF TISSUE EXPANDER LEFT BREAST;  Surgeon: Peggye Form, DO;  [facility];  Service: Plastics;  Laterality: Left;  Total case time is 3.5 hours   TUBAL LIGATION Bilateral 01/21/2011   Social History   Occupational History   Occupation: Charity fundraiser  Tobacco Use   Smoking status: Never   Smokeless tobacco: Never  Vaping Use   Vaping status: Never Used  Substance and Sexual Activity   Alcohol use: Yes    Comment: social   Drug use: No   Sexual activity: Not on file    Comment: BTL

## 2022-12-29 ENCOUNTER — Encounter: Payer: Self-pay | Admitting: Physical Therapy

## 2022-12-29 ENCOUNTER — Ambulatory Visit (INDEPENDENT_AMBULATORY_CARE_PROVIDER_SITE_OTHER): Payer: 59 | Admitting: Physical Therapy

## 2022-12-29 DIAGNOSIS — R262 Difficulty in walking, not elsewhere classified: Secondary | ICD-10-CM

## 2022-12-29 DIAGNOSIS — R600 Localized edema: Secondary | ICD-10-CM | POA: Diagnosis not present

## 2022-12-29 DIAGNOSIS — G8929 Other chronic pain: Secondary | ICD-10-CM | POA: Diagnosis not present

## 2022-12-29 DIAGNOSIS — M25561 Pain in right knee: Secondary | ICD-10-CM | POA: Diagnosis not present

## 2022-12-29 DIAGNOSIS — M6281 Muscle weakness (generalized): Secondary | ICD-10-CM | POA: Diagnosis not present

## 2022-12-29 NOTE — Therapy (Signed)
OUTPATIENT PHYSICAL THERAPY TREATMENT NOTE   Patient Name: Mackenzie [name] MRN: [id number] DOB:May 21, 1971, 52 y.o., female Today's Date: 12/29/2022  END OF SESSION:  PT End of Session - 12/29/22 1057     Visit Number 9    Number of Visits 20    Date for PT Re-Evaluation 02/11/23    Authorization Type [facility] [employer], deductible met    Progress Note Due on Visit 10    PT Start Time 1100    PT Stop Time 1140    PT Time Calculation (min) 40 min    Activity Tolerance Patient limited by pain    Behavior During Therapy [facility] for tasks assessed/performed                    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   Allergic rhinitis 2012   Anemia 01/27/2017   prior to starting chemotherapy   Breast cancer (HCC) 01/14/2017   Left breast   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, left (HCC) 01/23/2017   Edema, lower extremity    GERD (gastroesophageal reflux disease)    Healthcare maintenance 03/25/2017   Hot flashes 03/2017   Hyperlipidemia 03/20/2016   Morbid obesity with body mass index (BMI) of 40.0 to 49.9 (HCC) 09/17/2017   Pre-diabetes 07/26/2018   Hgb A1C elevated on 07/26/2018, Gestational Diabetes 2012   Seasonal allergies 2012   seasonal allergies causes allergic rhinitis and itchy, dry eyes per pt   Vitamin D deficiency 05/2015   Past Surgical History:  Procedure Laterality Date   BREAST RECONSTRUCTION WITH PLACEMENT OF TISSUE EXPANDER AND FLEX HD (ACELLULAR HYDRATED DERMIS) Left 08/27/2017   Procedure: LEFT BREAST RECONSTRUCTION WITH PLACEMENT OF TISSUE EXPANDER AND FLEX HD;  Surgeon: Peggye Form, DO;  Location: [facility];  Service: Plastics;  Laterality: Left;   CESAREAN SECTION     x2   KNEE ARTHROSCOPY WITH MEDIAL MENISECTOMY Right 11/12/2022   Procedure: RIGHT KNEE ARTHROSCOPY, PARTIAL MEDIAL MENISCECTOMY;  Surgeon: Tarry Kos, MD;  Location: [facility];   Service: Orthopedics;  Laterality: Right;   LATISSIMUS FLAP TO BREAST Left 11/03/2018   LATISSIMUS FLAP TO BREAST Left 11/03/2018   Procedure: LATISSIMUS FLAP TO LEFT BREAST;  Surgeon: Peggye Form, DO;  Location: [facility];  Service: Plastics;  Laterality: Left;   MASTECTOMY MODIFIED RADICAL Left 08/27/2017   MASTECTOMY MODIFIED RADICAL Left 08/27/2017   Procedure: LEFT MODIFIED RADICAL MASTECTOMY;  Surgeon: Harriette Bouillon, MD;  Location: [facility];  Service: General;  Laterality: Left;   MASTOPEXY Right 03/03/2019   Procedure: RIGHT BREAST MASTOPEXY/REDUCTION;  Surgeon: Peggye Form, DO;  Location: [facility];  Service: Plastics;  Laterality: Right;  3 hours, please   PORT-A-CATH REMOVAL Right 08/27/2017   Procedure: REMOVAL PORT-A-CATH RIGHT CHEST;  Surgeon: Harriette Bouillon, MD;  Location: [facility];  Service: General;  Laterality: Right;   PORTACATH PLACEMENT Right 01/28/2017   Procedure: INSERTION PORT-A-CATH WITH ULTRASOUND;  Surgeon: Harriette Bouillon, MD;  Location: [facility];  Service: General;  Laterality: Right;   REDUCTION MAMMAPLASTY Right    REMOVAL OF TISSUE EXPANDER AND PLACEMENT OF IMPLANT Left 03/03/2019   Procedure: REMOVAL OF TISSUE EXPANDER AND PLACEMENT OF EXPANDER;  Surgeon: Peggye Form, DO;  Location: [facility];  Service: Plastics;  Laterality: Left;   REMOVAL OF TISSUE EXPANDER AND PLACEMENT OF IMPLANT Left 05/25/2019  Procedure: LEFT BREAST REMOVAL OF TISSUE EXPANDER AND PLACEMENT OF IMPLANT;  Surgeon: Peggye Form, DO;  Location: [facility];  Service: Plastics;  Laterality: Left;   TISSUE EXPANDER PLACEMENT Left 11/03/2018   Procedure: PLACEMENT OF TISSUE EXPANDER LEFT BREAST;  Surgeon: Peggye Form, DO;  Location: [facility];  Service: Plastics;  Laterality: Left;  Total case time is 3.5 hours   TUBAL LIGATION Bilateral 01/21/2011   Patient Active Problem List   Diagnosis Date Noted    Acute meniscal tear, medial, right, initial encounter 11/12/2022   Plica syndrome of right knee 11/12/2022   Chondromalacia, right knee 11/12/2022   Depression 08/13/2022   BMI 40.0-44.9, adult (HCC) 08/13/2022   Obesity, Beginning BMI 08/13/2022   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   Boil of buttock, Left 12/24/2021   Hyperthyroidism 07/22/2021   Low serum thyroid stimulating hormone (TSH) 06/12/2021   Other fatigue 05/23/2021   Hemoglobin low 05/23/2021   Polyphagia 05/16/2020   Vitamin D deficiency 05/16/2020   At risk for osteoporosis 05/16/2020   Prediabetes 05/01/2020   Elevated blood pressure reading 05/01/2020   At risk for impaired metabolic function 05/01/2020   Breast asymmetry following reconstructive surgery 09/20/2019   Essential hypertension 08/17/2018   Class 3 severe obesity with serious comorbidity and body mass index (BMI) of 40.0 to 44.9 in adult [facility]) 09/17/2017   Acquired absence of left breast 09/15/2017   Malignant neoplasm of upper-inner quadrant of left breast in female, 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 MD  REFERRING PROVIDER: Cristie Hem, PA-C  REFERRING DIAG: [phone] (ICD-10-CM) - S/P right knee arthroscopy  THERAPY DIAG:  Chronic pain of right knee  Muscle weakness (generalized)  Difficulty in walking, not elsewhere classified  Localized edema  Rationale for Evaluation and Treatment: Rehabilitation  ONSET DATE: 11/12/2022 Surgery  SUBJECTIVE:  SUBJECTIVE STATEMENT: Pt stating she is trying not to use her cane when walking in the house. Pt stating she has been working on elevation to help with her swelling. Pt stating she has been trying to take Motrin to help with  inflammation.    PERTINENT HISTORY: History of Lt breast cancer, GERD, hyperlipidemia,   PAIN:  NPRS scale: 1/10 Pain location: Rt knee  Pain description: tightness, nagging Aggravating factors: increased WB activity, static positioning  Relieving factors: OTC medicine  PRECAUTIONS: None  WEIGHT BEARING RESTRICTIONS: No  FALLS:  Has patient fallen in last 6 months? No  LIVING ENVIRONMENT: Lives with: lives with their family Lives in: House/apartment Stairs: bedroom on 1st floor.  Enter house:  2-3 without hand rails.  Has following equipment at home: [facility], [facility]  OCCUPATION: RN - management with desk, on floor at times.    PLOF: Independent ,  [employer] leader, exercise walking   PATIENT GOALS: Reduce pain, walk independently   OBJECTIVE: (objective measures completed at initial evaluation unless otherwise dated)   PATIENT SURVEYS:  12/03/2022 FOTO intake: 52   predicted:  62 12/29/22: FOTO 63%  COGNITION: 12/03/2022 Overall cognitive status: WFL    SENSATION: 12/03/2022 WFL  EDEMA:  12/03/2022 Localized edema.   MUSCLE LENGTH: 12/03/2022 No specific testing today  POSTURE:  12/03/2022 Unremarkable  PALPATION: 12/03/2022 Anterior inferior knee.   LOWER EXTREMITY ROM:  ROM Right 12/03/2022 Left 12/03/2022 Right 12/08/22 Right 12/29/22 supine  Hip flexion      Hip extension      Hip abduction      Hip adduction      Hip internal rotation      Hip external rotation      Knee flexion 107 AROM in supine heel slide with pain noted 114 AROM in supine heel slide AAROM in supine 109 deg mild pain  A: 115 deg   Knee extension  -4 AROM in seated LAQ 0 AROM in seated LAQ     Ankle dorsiflexion      Ankle plantarflexion      Ankle inversion      Ankle eversion       (Blank rows = not tested)  LOWER EXTREMITY MMT:  MMT Right 12/03/2022 Left 12/03/2022 Right  12/23/2022  Hip flexion 5/5 5/5   Hip extension     Hip abduction     Hip adduction     Hip  internal rotation     Hip external rotation     Knee flexion     Knee extension 5/5 42, 42.1 lbs  5/5 68, 61.4 lbs 5/5 47.7, 46 lbs  Ankle dorsiflexion     Ankle plantarflexion     Ankle inversion     Ankle eversion      (Blank rows = not tested)  LOWER EXTREMITY SPECIAL TESTS:  12/03/2022 No specific testing  FUNCTIONAL TESTS:  12/16/2022: Rt SLS:  19 seconds   12/03/2022 18 inch chair transfer: without hands on 1st try with weight shift to Lt.  Lt SLS: 8 seconds Rt SLS: < 3 seconds  GAIT: 12/10/2022:  Able to perform safe independent ambulation in clinic on level surfaces.   12/03/2022 Mod Independent with cane in Lt UE, reduced stance on Rt LE.                                                                                                                                                                        TODAY'S TREATMENT                                                                            DATE: 12/29/2022 Ther Ex: Scifit bike: level 3 x 8 minutes seat 11 Step ups on 6 inch step: x 15 leading with Rt LE Rocker board: calf stretch x 2 holding 30 sec Rocker board: df/pf x  1 minute c UE support Rocker board: inv/eversion x 1 minute c UE support Leg Press; bil 112# 2 x 10, Rt LE: 62# 2 x 10  Seated SLR: 2 x 10  NMR:  Rocker board: df/pf x 1 minute c UE support Rocker board: inv/eversion x 1 minute c UE support Tandem walking 20 feet x 2  Modalities:   Vasopneumatic Device: 11Rt leg in supine elevation 34 deg medium compression 10 mins      TODAY'S TREATMENT                                                                            DATE: 12/25/2022 Ther Ex: Nustep Lvl 6 10.5 mins for ROM Standing gastroc stretch on board 30 sec x 5  Leg press double leg 112 lbs x 15, Rt leg only 2 x 15 56 lbs  Seated SLR Rt 2 x 10  Seated LAQ 5 lbs 2 x 15  Supine LAQ Rt leg in 90 deg hip flexion x 15 for swelling/edema  Discussion about edema reduction techniques including  HEP, propping legs above heart, ankle pumps, etc especially after longer periods of time of sitting/standing/walking when fluid buildup may be more noted.   Vasopneumatic Device Rt leg in supine elevation 34 deg medium compression 10 mins   TODAY'S TREATMENT                                                                            DATE: 12/23/2022 Ther Ex: UBE LE only lvl 3.0 8 mins, seat 12  Leg press double leg 112 lbs x 15, Rt leg only 2 x 15 56 lbs  Forward step up WB on Rt leg 2 x 15   Neuro Re-ed: Feet together stance on foam PF/DF alternating x 20 each way Tandem stance 1 min x 1 bilateral on foam  9 inch hurdle SLS c contralateral leg step over touch and back x 15 bilaterally   TODAY'S TREATMENT                                                                            DATE: 12/18/2022 Ther Ex: UBE LE only lvl 3.0 8 mins, seat 12  Leg press double leg 100 lbs x 15, Rt leg only 2 x 15 50 lbs  Lateral step down slowly 2 x 10 , performed bilaterally TRX assist squat over chair x 10   Neuro Re-ed: Tandem ambulation fwd/back on foam in // bars 6 ft x 5 each way  9 inch hurdle step to gait pattern in // bars with occasional to moderate HHA 10 ft x 5 leading with each LE  9 inch hurdle side to side stepping in // bars with occasional to moderate HHA 10 ft x 3 each direction.   TODAY'S TREATMENT                                                                            DATE: 12/16/2022 Ther Ex: UBE LE only lvl 2.0 8 mins, seat 12  Leg press double leg 100 lbs x 15, Rt leg only 2 x 10 50 lbs  Step on over and down 4 inch step x 10 WB on Rt leg with single hand rail assist Lateral step down slowly WB on Rt leg x 15 Sit to stand to sit x 15 no UE assist.    Neuro Re-ed: SLS on black mat with contralateral leg corner touching lightly x 8 each, performed bilaterally with occasional HHA on bar  PATIENT EDUCATION:  Education details: rationale for interventions Person educated:  Patient Education method: Programmer, multimedia, Demonstration, Verbal cues, and Handouts Education comprehension: verbalized understanding, returned demonstration, and verbal cues required  HOME EXERCISE PROGRAM: Access Code: [email or web address] URL: [email or web address] Date: 12/03/2022 Prepared by: Chyrel Masson  Exercises - Supine Heel Slide  - 1-2 x daily - 7 x weekly - 1 sets - 10 reps - 2 hold - Supine Quadricep Sets  - 1-2 x daily - 7 x weekly - 1 sets - 10 reps - 5 hold - Seated Long Arc Quad  - 3-5 x daily - 7 x weekly - 1-2 sets - 10 reps - 2 hold - Seated Quad Set  - 3-5 x daily - 7 x weekly - 1 sets - 10 reps - 5 hold - Seated Straight Leg Heel Taps  - 1-2 x daily - 7 x weekly - 3 sets - 10 reps - Sit to Stand  - 3 x daily - 7 x weekly - 1 sets - 10 reps  ASSESSMENT:  CLINICAL IMPRESSION: Pt arriving today with 1/10 pain in her Rt knee. Pt reporting tightness and swelling but feels ice with elevation have helped. Pt still focusing on Rt quad strengthening and overall improvements in functional mobility for returning to PLOF. Recommending with continued skilled PT interventions   OBJECTIVE IMPAIRMENTS: Abnormal gait, decreased activity tolerance, decreased balance, decreased coordination, decreased endurance, decreased mobility, difficulty walking, decreased ROM, decreased strength, increased edema, increased fascial restrictions, impaired perceived functional ability, impaired flexibility, improper body mechanics, and pain.   ACTIVITY LIMITATIONS: carrying, lifting, bending, sitting, standing, squatting, sleeping, stairs, transfers, and locomotion level  PARTICIPATION LIMITATIONS: meal prep, cleaning, interpersonal relationship, driving, shopping, community activity, and occupation  PERSONAL FACTORS:  History of Lt breast cancer, GERD, hyperlipidemia,   are also affecting patient's functional outcome.   REHAB POTENTIAL: Good  CLINICAL DECISION MAKING:  Stable/uncomplicated  EVALUATION COMPLEXITY: Low   GOALS: Goals reviewed with patient? Yes  SHORT TERM GOALS: (target date for Short term goals are 3 weeks  12/24/2022)   1.  Patient will demonstrate independent use of home exercise program to maintain progress from in clinic treatments.  Goal status: Met   LONG TERM GOALS: (target dates for all long term goals are 10 weeks  02/11/2023 )   1. Patient will demonstrate/report pain at worst less  than or equal to 2/10 to facilitate minimal limitation in daily activity secondary to pain symptoms.  Goal status: on going 12/23/2022   2. Patient will demonstrate independent use of home exercise program to facilitate ability to maintain/progress functional gains from skilled physical therapy services.  Goal status: on going 12/23/2022   3. Patient will demonstrate FOTO outcome > or = 62 % to indicate reduced disability due to condition.  Goal status: on going 12/23/2022   4.  Patient will demonstrate Rt LE dynamometry testing with 10 % of Lt for knee extension to faciltiate usual transfers, stairs, squatting at PLOF for daily life.   Goal status: on going 12/23/2022   5.  Patient will demonstrate independent ambulation community distances > 500 ft  Goal status: on going 12/23/2022   6.  Patient will demonstrate ascending/descending stairs reciprocally s UE assist for community integration.   Goal status: on going 12/23/2022   7.  Patient will demonstrate return to work at [name].  Goal Status: on going 12/23/2022   PLAN:  PT FREQUENCY: 1-2x/week  PT DURATION: 10 weeks  PLANNED INTERVENTIONS: Therapeutic exercises, Therapeutic activity, Neuro Muscular re-education, Balance training, Gait training, Patient/Family education, Joint mobilization, Stair training, DME instructions, Dry Needling, Electrical stimulation, Traction, Cryotherapy, vasopneumatic deviceMoist heat, Taping, Ultrasound, Ionotophoresis 4mg /ml Dexamethasone, and aquatic  therapy, Manual therapy.  All included unless contraindicated  PLAN FOR NEXT SESSION:  Continue LE strengthening,  Vaso as needed   Narda Amber, PT, MPT 12/29/22 11:43 AM   12/29/22  11:43 AM

## 2022-12-31 ENCOUNTER — Ambulatory Visit (INDEPENDENT_AMBULATORY_CARE_PROVIDER_SITE_OTHER): Payer: 59 | Admitting: Rehabilitative and Restorative Service Providers"

## 2022-12-31 ENCOUNTER — Encounter: Payer: Self-pay | Admitting: Rehabilitative and Restorative Service Providers"

## 2022-12-31 DIAGNOSIS — R600 Localized edema: Secondary | ICD-10-CM | POA: Diagnosis not present

## 2022-12-31 DIAGNOSIS — G8929 Other chronic pain: Secondary | ICD-10-CM

## 2022-12-31 DIAGNOSIS — M25561 Pain in right knee: Secondary | ICD-10-CM

## 2022-12-31 DIAGNOSIS — R262 Difficulty in walking, not elsewhere classified: Secondary | ICD-10-CM

## 2022-12-31 DIAGNOSIS — M6281 Muscle weakness (generalized): Secondary | ICD-10-CM

## 2022-12-31 NOTE — Therapy (Signed)
OUTPATIENT PHYSICAL THERAPY TREATMENT NOTE / PROGRESS NOTE   Patient Name: Mackenzie [name] MRN: [id number] DOB:[birth year], 52 y.o., female Today's Date: 12/31/2022   Progress Note Reporting Period 12/03/2022 to 12/31/2022  See note below for Objective Data and Assessment of Progress/Goals.   END OF SESSION:  PT End of Session - 12/31/22 1307     Visit Number 10    Number of Visits 20    Date for PT Re-Evaluation 02/11/23    Authorization Type [facility] [employer], deductible met    Progress Note Due on Visit 20    PT Start Time 1304    PT Stop Time 1353    PT Time Calculation (min) 49 min    Activity Tolerance Patient tolerated treatment well    Behavior During Therapy [facility] for tasks assessed/performed                     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   Allergic rhinitis 2012   Anemia 01/27/2017   prior to starting chemotherapy   Breast cancer (HCC) 01/14/2017   Left breast   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, left (HCC) 01/23/2017   Edema, lower extremity    GERD (gastroesophageal reflux disease)    Healthcare maintenance 03/25/2017   Hot flashes 03/2017   Hyperlipidemia 03/20/2016   Morbid obesity with body mass index (BMI) of 40.0 to 49.9 (HCC) 09/17/2017   Pre-diabetes 07/26/2018   Hgb A1C elevated on 07/26/2018, Gestational Diabetes 2012   Seasonal allergies 2012   seasonal allergies causes allergic rhinitis and itchy, dry eyes per pt   Vitamin D deficiency 05/2015   Past Surgical History:  Procedure Laterality Date   BREAST RECONSTRUCTION WITH PLACEMENT OF TISSUE EXPANDER AND FLEX HD (ACELLULAR HYDRATED DERMIS) Left 08/27/2017   Procedure: LEFT BREAST RECONSTRUCTION WITH PLACEMENT OF TISSUE EXPANDER AND FLEX HD;  Surgeon: Peggye Form, DO;  Location: [facility];  Service: Plastics;  Laterality: Left;   CESAREAN SECTION     x2   KNEE ARTHROSCOPY WITH MEDIAL  MENISECTOMY Right 11/12/2022   Procedure: RIGHT KNEE ARTHROSCOPY, PARTIAL MEDIAL MENISCECTOMY;  Surgeon: Tarry Kos, MD;  Location: [facility];  Service: Orthopedics;  Laterality: Right;   LATISSIMUS FLAP TO BREAST Left 11/03/2018   LATISSIMUS FLAP TO BREAST Left 11/03/2018   Procedure: LATISSIMUS FLAP TO LEFT BREAST;  Surgeon: Peggye Form, DO;  Location: [facility];  Service: Plastics;  Laterality: Left;   MASTECTOMY MODIFIED RADICAL Left 08/27/2017   MASTECTOMY MODIFIED RADICAL Left 08/27/2017   Procedure: LEFT MODIFIED RADICAL MASTECTOMY;  Surgeon: Harriette Bouillon, MD;  Location: [facility];  Service: General;  Laterality: Left;   MASTOPEXY Right 03/03/2019   Procedure: RIGHT BREAST MASTOPEXY/REDUCTION;  Surgeon: Peggye Form, DO;  Location: [facility];  Service: Plastics;  Laterality: Right;  3 hours, please   PORT-A-CATH REMOVAL Right 08/27/2017   Procedure: REMOVAL PORT-A-CATH RIGHT CHEST;  Surgeon: Harriette Bouillon, MD;  Location: [facility];  Service: General;  Laterality: Right;   PORTACATH PLACEMENT Right 01/28/2017   Procedure: INSERTION PORT-A-CATH WITH ULTRASOUND;  Surgeon: Harriette Bouillon, MD;  Location: [facility];  Service: General;  Laterality: Right;   REDUCTION MAMMAPLASTY Right    REMOVAL OF TISSUE EXPANDER AND PLACEMENT OF IMPLANT Left 03/03/2019   Procedure: REMOVAL OF TISSUE EXPANDER AND PLACEMENT OF EXPANDER;  Surgeon: Peggye Form, DO;  Location: [facility];  Service: Plastics;  Laterality: Left;   REMOVAL OF TISSUE EXPANDER AND PLACEMENT OF IMPLANT Left 05/25/2019   Procedure: LEFT BREAST REMOVAL OF TISSUE EXPANDER AND PLACEMENT OF IMPLANT;  Surgeon: Peggye Form, DO;  Location: [name];  Service: Plastics;  Laterality: Left;   TISSUE EXPANDER PLACEMENT Left 11/03/2018   Procedure: PLACEMENT OF TISSUE EXPANDER LEFT BREAST;  Surgeon: Peggye Form, DO;  Location: [facility];   Service: Plastics;  Laterality: Left;  Total case time is 3.5 hours   TUBAL LIGATION Bilateral 01/21/2011   Patient Active Problem List   Diagnosis Date Noted   Acute meniscal tear, medial, right, initial encounter 11/12/2022   Plica syndrome of right knee 11/12/2022   Chondromalacia, right knee 11/12/2022   Depression 08/13/2022   BMI 40.0-44.9, adult (HCC) 08/13/2022   Obesity, Beginning BMI 08/13/2022   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   Boil of buttock, Left 12/24/2021   Hyperthyroidism 07/22/2021   Low serum thyroid stimulating hormone (TSH) 06/12/2021   Other fatigue 05/23/2021   Hemoglobin low 05/23/2021   Polyphagia 05/16/2020   Vitamin D deficiency 05/16/2020   At risk for osteoporosis 05/16/2020   Prediabetes 05/01/2020   Elevated blood pressure reading 05/01/2020   At risk for impaired metabolic function 05/01/2020   Breast asymmetry following reconstructive surgery 09/20/2019   Essential hypertension 08/17/2018   Class 3 severe obesity with serious comorbidity and body mass index (BMI) of 40.0 to 44.9 in adult [facility]) 09/17/2017   Acquired absence of left breast 09/15/2017   Malignant neoplasm of upper-inner quadrant of left breast in female, 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 MD  REFERRING PROVIDER: Cristie Hem, PA-C  REFERRING DIAG: [id number] (ICD-10-CM) - S/P right knee arthroscopy  THERAPY DIAG:  Chronic pain of right knee  Muscle weakness (generalized)  Difficulty in walking, not elsewhere classified  Localized edema  Rationale for Evaluation and Treatment: Rehabilitation  ONSET DATE: 11/12/2022 Surgery  SUBJECTIVE:  SUBJECTIVE STATEMENT: She indicated feeling improvement in  last week.  Reported forgetting to take medicine at times.  Pt indicated walking without cane more.   Reported overall improvement to 70-75% at this time.  She indicated she still need to improve in walking distance/fatigue.   PERTINENT HISTORY: History of Lt breast cancer, GERD, hyperlipidemia,   PAIN:  NPRS scale: no pain upon arrival.  Pain location: Rt knee  Pain description: tightness, nagging Aggravating factors: increased WB activity, static positioning  Relieving factors: OTC medicine  PRECAUTIONS: None  WEIGHT BEARING RESTRICTIONS: No  FALLS:  Has patient fallen in last 6 months? No  LIVING ENVIRONMENT: Lives with: lives with their family Lives in: House/apartment Stairs: bedroom on 1st floor.  Enter house:  2-3 without hand rails.  Has following equipment at home: [facility], [facility]  OCCUPATION: RN - management with desk, on floor at times.    PLOF: Independent ,  [employer] leader, exercise walking   PATIENT GOALS: Reduce pain, walk independently   OBJECTIVE: (objective measures completed at initial evaluation unless otherwise dated)   PATIENT SURVEYS:  12/29/22: FOTO 63%  12/03/2022 FOTO intake: 52   predicted:  62   COGNITION: 12/03/2022 Overall cognitive status: WFL    SENSATION: 12/03/2022 [facility]  EDEMA:  12/03/2022 Localized edema.   MUSCLE LENGTH: 12/03/2022 No specific testing today  POSTURE:  12/03/2022 Unremarkable  PALPATION: 12/03/2022 Anterior inferior knee.   LOWER EXTREMITY ROM:   ROM Right 12/03/2022 Left 12/03/2022 Right 12/08/22 Right 12/29/22 supine  Hip flexion      Hip extension      Hip abduction      Hip adduction      Hip internal rotation      Hip external rotation      Knee flexion 107 AROM in supine heel slide with pain noted 114 AROM in supine heel slide AAROM in supine 109 deg mild pain  A: 115 deg   Knee extension  -4 AROM in seated LAQ 0 AROM in seated LAQ     Ankle dorsiflexion      Ankle plantarflexion      Ankle  inversion      Ankle eversion       (Blank rows = not tested)  LOWER EXTREMITY MMT:  MMT Right 12/03/2022 Left 12/03/2022 Right  12/23/2022 Right 12/31/2022  Hip flexion 5/5 5/5    Hip extension      Hip abduction      Hip adduction      Hip internal rotation      Hip external rotation      Knee flexion      Knee extension 5/5 42, 42.1 lbs  5/5 68, 61.4 lbs 5/5 47.7, 46 lbs 5/5 47.5, 44 lbs  Ankle dorsiflexion      Ankle plantarflexion      Ankle inversion      Ankle eversion       (Blank rows = not tested)  LOWER EXTREMITY SPECIAL TESTS:  12/03/2022 No specific testing  FUNCTIONAL TESTS:  12/16/2022: Rt SLS:  19 seconds   12/03/2022 18 inch chair transfer: without hands on 1st try with weight shift to Lt.  Lt SLS: 8 seconds Rt SLS: < 3 seconds  GAIT: 12/10/2022:  Able to perform safe independent ambulation in clinic on level surfaces.   12/03/2022 Mod Independent with cane in Lt UE, reduced stance on Rt LE.                                                                                                                                                                         TODAY'S TREATMENT  DATE: 12/31/2022 Ther Ex: UBE UE/LE lvl 3 8 mins, seat 11  Leg press double leg 118 lbs x 15, Rt leg only 2 x 15 56 lbs  Seated LAQ 5 lbs 2 x 15  c 2-3 sec pause in full range extension Supine bridge x 20  Supine Rt leg quad set combo with AROM heel slide 5 sec hold each x 10 each  TherActivity Flight of stairs ascending/descending c reciprocal gait pattern with one hand rail.  Verbal cues for use of hand rail even as strength improved.   Vasopneumatic Device Rt leg in supine elevation 34 deg medium compression 10 mins   TODAY'S TREATMENT                                                                            DATE: 12/29/2022  Ther Ex: Scifit bike: level 3 x 8 minutes seat 11 Step ups on 6 inch step:  x 15 leading with Rt LE Rocker board: calf stretch x 2 holding 30 sec Rocker board: df/pf x 1 minute c UE support Rocker board: inv/eversion x 1 minute c UE support Leg Press; bil 112# 2 x 10, Rt LE: 62# 2 x 10  Seated SLR: 2 x 10  NMR:  Rocker board: df/pf x 1 minute c UE support Rocker board: inv/eversion x 1 minute c UE support Tandem walking 20 feet x 2  Modalities:   Vasopneumatic Device: 11Rt leg in supine elevation 34 deg medium compression 10 mins    TODAY'S TREATMENT                                                                            DATE: 12/25/2022 Ther Ex: Nustep Lvl 6 10.5 mins for ROM Standing gastroc stretch on board 30 sec x 5  Leg press double leg 112 lbs x 15, Rt leg only 2 x 15 56 lbs  Seated SLR Rt 2 x 10  Seated LAQ 5 lbs 2 x 15  Supine LAQ Rt leg in 90 deg hip flexion x 15 for swelling/edema  Discussion about edema reduction techniques including HEP, propping legs above heart, ankle pumps, etc especially after longer periods of time of sitting/standing/walking when fluid buildup may be more noted.   Vasopneumatic Device Rt leg in supine elevation 34 deg medium compression 10 mins   TODAY'S TREATMENT                                                                            DATE: 12/23/2022 Ther Ex: UBE LE only lvl 3.0 8 mins, seat 12  Leg press double leg 112 lbs x 15, Rt leg only  2 x 15 56 lbs  Forward step up WB on Rt leg 2 x 15   Neuro Re-ed: Feet together stance on foam PF/DF alternating x 20 each way Tandem stance 1 min x 1 bilateral on foam  9 inch hurdle SLS c contralateral leg step over touch and back x 15 bilaterally   PATIENT EDUCATION:  Education details: rationale for interventions Person educated: Patient Education method: Programmer, multimedia, Demonstration, Verbal cues, and Handouts Education comprehension: verbalized understanding, returned demonstration, and verbal cues required  HOME EXERCISE PROGRAM: Access Code: [email or web address] URL:  [email or web address] Date: 12/03/2022 Prepared by: Chyrel Masson  Exercises - Supine Heel Slide  - 1-2 x daily - 7 x weekly - 1 sets - 10 reps - 2 hold - Supine Quadricep Sets  - 1-2 x daily - 7 x weekly - 1 sets - 10 reps - 5 hold - Seated Long Arc Quad  - 3-5 x daily - 7 x weekly - 1-2 sets - 10 reps - 2 hold - Seated Quad Set  - 3-5 x daily - 7 x weekly - 1 sets - 10 reps - 5 hold - Seated Straight Leg Heel Taps  - 1-2 x daily - 7 x weekly - 3 sets - 10 reps - Sit to Stand  - 3 x daily - 7 x weekly - 1 sets - 10 reps  ASSESSMENT:  CLINICAL IMPRESSION: The patient has attended 10 visits over the course of treatment cycle.  Patient has reported overall improvement at 70-75 %.  See objective data above for updated information regarding current presentation.   Pt has demonstrated improvement in range of motion and strength to this point.  Transitioning away from [facility] use steadily but fatigue in activity still noted.  Recommend continued skilled PT services to help continue progression towards goals.     OBJECTIVE IMPAIRMENTS: Abnormal gait, decreased activity tolerance, decreased balance, decreased coordination, decreased endurance, decreased mobility, difficulty walking, decreased ROM, decreased strength, increased edema, increased fascial restrictions, impaired perceived functional ability, impaired flexibility, improper body mechanics, and pain.   ACTIVITY LIMITATIONS: carrying, lifting, bending, sitting, standing, squatting, sleeping, stairs, transfers, and locomotion level  PARTICIPATION LIMITATIONS: meal prep, cleaning, interpersonal relationship, driving, shopping, community activity, and occupation  PERSONAL FACTORS:  History of Lt breast cancer, GERD, hyperlipidemia,   are also affecting patient's functional outcome.   REHAB POTENTIAL: Good  CLINICAL DECISION MAKING: Stable/uncomplicated  EVALUATION COMPLEXITY: Low   GOALS: Goals reviewed with patient?  Yes  SHORT TERM GOALS: (target date for Short term goals are 3 weeks  12/24/2022)   1.  Patient will demonstrate independent use of home exercise program to maintain progress from in clinic treatments.  Goal status: Met   LONG TERM GOALS: (target dates for all long term goals are 10 weeks  02/11/2023 )   1. Patient will demonstrate/report pain at worst less than or equal to 2/10 to facilitate minimal limitation in daily activity secondary to pain symptoms.  Goal status: on going 12/31/2022   2. Patient will demonstrate independent use of home exercise program to facilitate ability to maintain/progress functional gains from skilled physical therapy services.  Goal status: on going 12/31/2022   3. Patient will demonstrate FOTO outcome > or = 62 % to indicate reduced disability due to condition.  Goal status: on going 12/31/2022   4.  Patient will demonstrate Rt LE dynamometry testing with 10 % of Lt for knee extension to faciltiate usual transfers, stairs, squatting at PLOF for daily life.  Goal status: on going 12/31/2022   5.  Patient will demonstrate independent ambulation community distances > 500 ft  Goal status: on going 12/31/2022   6.  Patient will demonstrate ascending/descending stairs reciprocally s UE assist for community integration.   Goal status: on going 12/31/2022   7.  Patient will demonstrate return to work at [name].  Goal Status: on going 12/31/2022   PLAN:  PT FREQUENCY: 1-2x/week  PT DURATION: 10 weeks  PLANNED INTERVENTIONS: Therapeutic exercises, Therapeutic activity, Neuro Muscular re-education, Balance training, Gait training, Patient/Family education, Joint mobilization, Stair training, DME instructions, Dry Needling, Electrical stimulation, Traction, Cryotherapy, vasopneumatic deviceMoist heat, Taping, Ultrasound, Ionotophoresis 4mg /ml Dexamethasone, and aquatic therapy, Manual therapy.  All included unless contraindicated  PLAN FOR NEXT SESSION:   Progressive strengthening /stair improvements for functional movements.  Possible reduced frequency in next few visits?   Chyrel Masson, PT, DPT, OCS, ATC 12/31/22  1:41 PM

## 2023-01-01 ENCOUNTER — Other Ambulatory Visit: Payer: Self-pay | Admitting: *Deleted

## 2023-01-01 ENCOUNTER — Other Ambulatory Visit (HOSPITAL_COMMUNITY): Payer: Self-pay

## 2023-01-01 ENCOUNTER — Telehealth: Payer: Self-pay

## 2023-01-01 ENCOUNTER — Other Ambulatory Visit (INDEPENDENT_AMBULATORY_CARE_PROVIDER_SITE_OTHER): Payer: Self-pay | Admitting: Adult Health

## 2023-01-01 DIAGNOSIS — R7303 Prediabetes: Secondary | ICD-10-CM

## 2023-01-01 NOTE — Telephone Encounter (Signed)
[name]: Attempted to call patient to inform her that her MD appointment on 7/29 is with Dr Pamelia Hoit instead of Dr Al Pimple due to provider's out of office schedule. Voicemail is full. Will attempt again later today. Lab and MD appointments have not changed dates or times (only providers) so scheduling should not be affected.  Juanita Laster, RN, BSN, CPN Clinical Research Nurse I [phone]  01/01/2023 12:38 PM

## 2023-01-02 ENCOUNTER — Other Ambulatory Visit (HOSPITAL_COMMUNITY): Payer: Self-pay

## 2023-01-02 ENCOUNTER — Other Ambulatory Visit: Payer: Self-pay

## 2023-01-02 MED ORDER — GABAPENTIN 300 MG PO CAPS
300.0000 mg | ORAL_CAPSULE | Freq: Two times a day (BID) | ORAL | 0 refills | Status: DC
  Filled 2023-01-02: qty 180, 90d supply, fill #0

## 2023-01-02 MED ORDER — LOSARTAN POTASSIUM 50 MG PO TABS
50.0000 mg | ORAL_TABLET | Freq: Every day | ORAL | 0 refills | Status: DC
  Filled 2023-01-02: qty 90, 90d supply, fill #0

## 2023-01-04 NOTE — Progress Notes (Signed)
Patient Care Team: Dois Davenport, MD as PCP - General (Family Medicine) Quintella Reichert, MD as PCP - Cardiology (Cardiology) Harriette Bouillon, MD as Consulting Physician (General Surgery) Dorothy Puffer, MD as Consulting Physician (Radiation Oncology) Dillingham, Alena Bills, DO as Attending Physician (Plastic Surgery) Rolanda Jay, RN as Registered Nurse Rachel Moulds, MD as Consulting Physician (Hematology and Oncology)  DIAGNOSIS: No diagnosis found.  SUMMARY OF ONCOLOGIC HISTORY: Oncology History  Malignant 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, estrogen receptor positive (HCC)  01/23/2017 Initial Diagnosis   Malignant 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, estrogen receptor positive (HCC)   01/27/2017 Cancer Staging   Staging form: Breast, AJCC 8th Edition - Clinical: Stage IIA (cT3, cN2(f), cM0, G2, ER+, PR+, HER2-) - Signed by Lowella Dell, MD on 06/27/2020   03/24/2017 Genetic Testing   Patient had genetic testing due to a personal and family history of breast cancer.  The Common Hereditary Cancer Panel was ordered.  The Hereditary Gene Panel offered by [facility] includes sequencing and/or deletion duplication testing of the following 46 genes: APC, ATM, AXIN2, BARD1, BMPR1A, BRCA1, BRCA2, BRIP1, CDH1, CDKN2A (p14ARF), CDKN2A (p16INK4a), CHEK2, CTNNA1, DICER1, EPCAM (Deletion/duplication testing only), GREM1 (promoter region deletion/duplication testing only), KIT, MEN1, MLH1, MSH2, MSH3, MSH6, MUTYH, NBN, NF1, NHTL1, PALB2, PDGFRA, PMS2, POLD1, POLE, PTEN, RAD50, RAD51C, RAD51D, SDHB, SDHC, SDHD, SMAD4, SMARCA4. STK11, TP53, TSC1, TSC2, and VHL.  The following genes were evaluated for sequence changes only: SDHA and HOXB13 c.251G>A variant only.     Results: Negative, no pathogenic variants identified.  The date of this test report is 03/24/2017.     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    Malignant neoplasm of upper-inner quadrant of left breast in female, estrogen receptor positive (HCC)  07/27/2017 Initial Diagnosis   Malignant neoplasm of upper-inner quadrant of left breast in female, estrogen receptor positive (HCC)   05/31/2018 - 03/04/2021 Chemotherapy   The patient had Investigational ribociclib (KISQALI) 200 MG tablet [name], 400 mg, Oral, Daily, 11 of 16 cycles  for chemotherapy treatment.      CHIEF COMPLIANT: Follow-up letrozole  INTERVAL HISTORY: Mackenzie [name] is a   ALLERGIES:  is allergic to dilaudid [hydromorphone].  MEDICATIONS:  Current Outpatient Medications  Medication Sig Dispense Refill   acetaminophen (TYLENOL) 500 MG tablet Take 1,000 mg by mouth every 6 (six) hours as needed (for pain/headaches.).      Ascorbic Acid (VITAMIN C PO) Take by mouth daily.     Blood Pressure Monitoring (OMRON 3 SERIES BP MONITOR) DEVI Use as directed 1 each 0   buPROPion (WELLBUTRIN SR) 150 MG 12 hr tablet Take 1 tablet (150 mg total) by mouth daily. 90 tablet 0   calcium carbonate (TUMS - DOSED IN MG ELEMENTAL CALCIUM) 500 MG chewable tablet Chew 1 tablet  by mouth 2 (two) times daily.     celecoxib (CELEBREX) 200 MG capsule Take 1 capsule (200 mg total) by mouth 2 (two) times daily. (Patient taking differently: Take 200 mg by mouth daily.) 30 capsule 3   cetirizine  (ZYRTEC) 10 MG tablet Take 10 mg by mouth daily.     cyclobenzaprine (FLEXERIL) 10 MG tablet Take 1/2 tablet (5 mg total) by mouth 2 (two) times daily as needed. 30 tablet 0   gabapentin (NEURONTIN) 300 MG capsule Take 1 capsule (300 mg total) by mouth 2 (two) times daily. 180 capsule 0   HYDROcodone-acetaminophen (NORCO) 5-325 MG tablet Take 1 tablet by mouth every 6 (six) hours as needed. 20 tablet 0   ibuprofen (ADVIL) 800 MG tablet Take 800 mg by mouth every 8 (eight) hours as needed (pain).     ibuprofen (ADVIL) 800 MG tablet Take 1 tablet (800 mg total) by mouth every 8 (eight) hours as needed. 30 tablet 2   letrozole (FEMARA) 2.5 MG tablet Take 1 tablet (2.5 mg total) by mouth daily. 90 tablet 4   loratadine (CLARITIN) 10 MG tablet Take 1 tablet (10 mg total) by mouth daily. (Patient taking differently: Take 10 mg by mouth daily as needed for allergies.) 90 tablet 2   losartan (COZAAR) 50 MG tablet Take 1 tablet (50 mg total) by mouth daily. 90 tablet 0   metFORMIN (GLUCOPHAGE) 500 MG tablet Take 1 tablet (500 mg total) by mouth 2 (two) times daily with a meal. 180 tablet 0   methimazole (TAPAZOLE) 5 MG tablet Take 1 tablet daily Monday through Friday and 2 tablets on Saturday and Sundays as directed 108 tablet 3   metoprolol succinate (TOPROL-XL) 50 MG 24 hr tablet Take 1 tablet (50 mg total) by mouth daily. (Patient taking differently: Take 25 mg by mouth daily.) 90 tablet 0   Minoxidil (ROGAINE MENS) 5 % FOAM apply twice daily as directed 180 g 0   montelukast (SINGULAIR) 10 MG tablet Take 1 tablet (10 mg total) by mouth at bedtime. 90 tablet 0   Multiple Vitamin (MULTIVITAMIN WITH MINERALS) TABS tablet Take 1 tablet by mouth daily.      Omega-3 Fatty Acids (FISH OIL) 1000 MG CAPS Take 1,000 mg by mouth 2 (two) times daily.      omeprazole (PRILOSEC) 40 MG capsule Take 1 capsule (40 mg total) by mouth daily. 90 capsule 1   ondansetron (ZOFRAN) 4 MG tablet Take 1 tablet (4 mg total) by  mouth every 8 (eight) hours as needed for nausea or vomiting. 20 tablet 0   Probiotic Product (PROBIOTIC DAILY PO) Take 1 capsule by mouth 2 (two) times daily.      rosuvastatin (CRESTOR) 10 MG tablet Take 1 tablet (10 mg total) by mouth daily. 90 tablet 0   spironolactone (ALDACTONE) 25 MG tablet Take 1 tablet (25 mg total) by mouth daily. 90 tablet 0   ZINC OXIDE PO Take 1 tablet by mouth 2 (two) times a day.     No current facility-administered medications for this visit.   Facility-Administered Medications Ordered in Other Visits  Medication Dose Route Frequency Provider Last Rate Last Admin   sodium chloride flush (NS) 0.9 % injection 10 mL  10 mL Intravenous PRN Magrinat, Valentino Hue, MD   10 mL at 03/10/17 1239    PHYSICAL EXAMINATION: ECOG PERFORMANCE STATUS: {CHL ONC ECOG PS:[phone]}  There were no vitals filed for this visit. There were no vitals filed for this visit.  BREAST:*** No palpable masses or nodules in either right or left breasts. No palpable axillary supraclavicular or infraclavicular adenopathy no breast tenderness or nipple discharge. (exam performed in the presence of a chaperone)  LABORATORY DATA:  I have reviewed the data as listed    Latest Ref Rng & Units 11/10/2022   12:00 PM 09/03/2022    3:43 PM 01/29/2022    9:28 AM  CMP  Glucose 70 - 99 mg/dL 010  75  96   BUN 6 - 20 mg/dL 21  17  16    Creatinine 0.44 - 1.00 mg/dL 2.72  5.36  6.44   Sodium 135 - 145 mmol/L 137  136  137   Potassium 3.5 - 5.1 mmol/L 5.2  4.6  4.5   Chloride 98 - 111 mmol/L 102  99  101   CO2 22 - 32 mmol/L 24  23  23    Calcium 8.9 - 10.3 mg/dL 9.8  9.7  9.5   Total Protein 6.0 - 8.5 g/dL  7.5  7.7   Total Bilirubin 0.0 - 1.2 mg/dL  0.3  0.3   Alkaline Phos 44 - 121 IU/L  129  177   AST 0 - 40 IU/L  21  16   ALT 0 - 32 IU/L  20  15     Lab Results  Component Value Date   WBC 3.8 (L) 07/23/2021   HGB 11.5 (L) 07/23/2021   HCT 35.9 (L) 07/23/2021   MCV 79.6 07/23/2021   PLT  201.0 07/23/2021   NEUTROABS 1.9 07/23/2021    ASSESSMENT & PLAN:  No problem-specific Assessment & Plan notes found for this encounter.    No orders of the defined types were placed in this encounter.  The patient has a good understanding of the overall plan. she agrees with it. she will call with any problems that may develop before the next visit here. Total time spent: 30 mins including face to face time and time spent for planning, charting and co-ordination of care   Sherlyn Lick, CMA 01/04/23    I Janan Ridge am acting as a Neurosurgeon for [employer]  ***

## 2023-01-05 ENCOUNTER — Telehealth: Payer: Self-pay | Admitting: Hematology and Oncology

## 2023-01-05 ENCOUNTER — Inpatient Hospital Stay: Payer: 59 | Attending: Hematology and Oncology

## 2023-01-05 ENCOUNTER — Other Ambulatory Visit: Payer: Self-pay

## 2023-01-05 ENCOUNTER — Encounter: Payer: Self-pay | Admitting: *Deleted

## 2023-01-05 ENCOUNTER — Ambulatory Visit: Payer: 59 | Admitting: Hematology and Oncology

## 2023-01-05 ENCOUNTER — Inpatient Hospital Stay (HOSPITAL_BASED_OUTPATIENT_CLINIC_OR_DEPARTMENT_OTHER): Payer: 59 | Admitting: Hematology and Oncology

## 2023-01-05 ENCOUNTER — Other Ambulatory Visit: Payer: 59

## 2023-01-05 VITALS — BP 135/76 | HR 68 | Temp 97.5°F | Resp 18 | Ht 65.0 in | Wt 259.9 lb

## 2023-01-05 DIAGNOSIS — Z006 Encounter for examination for normal comparison and control in clinical research program: Secondary | ICD-10-CM | POA: Diagnosis not present

## 2023-01-05 DIAGNOSIS — Z9012 Acquired absence of left breast and nipple: Secondary | ICD-10-CM | POA: Insufficient documentation

## 2023-01-05 DIAGNOSIS — Z923 Personal history of irradiation: Secondary | ICD-10-CM | POA: Insufficient documentation

## 2023-01-05 DIAGNOSIS — C50212 Malignant neoplasm of upper-inner quadrant of left female breast: Secondary | ICD-10-CM

## 2023-01-05 DIAGNOSIS — C773 Secondary and unspecified malignant neoplasm of axilla and upper limb lymph nodes: Secondary | ICD-10-CM | POA: Diagnosis not present

## 2023-01-05 DIAGNOSIS — Z79811 Long term (current) use of aromatase inhibitors: Secondary | ICD-10-CM | POA: Diagnosis not present

## 2023-01-05 DIAGNOSIS — Z9221 Personal history of antineoplastic chemotherapy: Secondary | ICD-10-CM | POA: Insufficient documentation

## 2023-01-05 DIAGNOSIS — C50812 Malignant neoplasm of overlapping sites of left female breast: Secondary | ICD-10-CM

## 2023-01-05 DIAGNOSIS — Z170 Estrogen receptor positive status [ER+]: Secondary | ICD-10-CM | POA: Diagnosis not present

## 2023-01-05 LAB — CBC WITH DIFFERENTIAL (CANCER CENTER ONLY)
Abs Immature Granulocytes: 0.01 10*3/uL (ref 0.00–0.07)
Basophils Absolute: 0 10*3/uL (ref 0.0–0.1)
Basophils Relative: 1 %
Eosinophils Absolute: 0.1 10*3/uL (ref 0.0–0.5)
Eosinophils Relative: 2 %
HCT: 37.9 % (ref 36.0–46.0)
Hemoglobin: 12.4 g/dL (ref 12.0–15.0)
Immature Granulocytes: 0 %
Lymphocytes Relative: 34 %
Lymphs Abs: 1.6 10*3/uL (ref 0.7–4.0)
MCH: 28.2 pg (ref 26.0–34.0)
MCHC: 32.7 g/dL (ref 30.0–36.0)
MCV: 86.1 fL (ref 80.0–100.0)
Monocytes Absolute: 0.5 10*3/uL (ref 0.1–1.0)
Monocytes Relative: 11 %
Neutro Abs: 2.5 10*3/uL (ref 1.7–7.7)
Neutrophils Relative %: 52 %
Platelet Count: 205 10*3/uL (ref 150–400)
RBC: 4.4 MIL/uL (ref 3.87–5.11)
RDW: 14.5 % (ref 11.5–15.5)
WBC Count: 4.8 10*3/uL (ref 4.0–10.5)
nRBC: 0 % (ref 0.0–0.2)

## 2023-01-05 LAB — CMP (CANCER CENTER ONLY)
ALT: 16 U/L (ref 0–44)
AST: 16 U/L (ref 15–41)
Albumin: 4 g/dL (ref 3.5–5.0)
Alkaline Phosphatase: 107 U/L (ref 38–126)
Anion gap: 4 — ABNORMAL LOW (ref 5–15)
BUN: 13 mg/dL (ref 6–20)
CO2: 28 mmol/L (ref 22–32)
Calcium: 9.3 mg/dL (ref 8.9–10.3)
Chloride: 104 mmol/L (ref 98–111)
Creatinine: 0.8 mg/dL (ref 0.44–1.00)
GFR, Estimated: >60 mL/min (ref >60)
Glucose, Bld: 117 mg/dL — ABNORMAL HIGH (ref 70–99)
Potassium: 4.2 mmol/L (ref 3.5–5.1)
Sodium: 136 mmol/L (ref 135–145)
Total Bilirubin: 0.3 mg/dL (ref 0.3–1.2)
Total Protein: 6.9 g/dL (ref 6.5–8.1)

## 2023-01-05 LAB — RESEARCH LABS

## 2023-01-05 NOTE — Research (Signed)
Mackenzie [name] - Visit 25  [name] arrived today for her scheduled Visit 25 on the Mackenzie [name] protocol.  PROs:  Provided to patient at registration. Collected and reviewed for accuracy and completeness.   Labs: Research samples collected per protocol.  Hormone levels, CBC and CMET collected per Dr. Al Pimple.   Healthcare Resource Utilization: Patient did not have any ED visits or hospitalizations since last study contact. She did not have any visits in our clinic for any issues related to her breast cancer or treatment since her last study visit.   Serious Adverse Events:  No SAEs since last study contact. Patient did have scheduled out patient surgery on 11/12/22 to repair a right knee meniscus tear.  Patient states she is recovering and going to physical therapy. She did not have any complications or SAEs as a result of this surgery.  Dr. Al Pimple stated that the meniscus tear is not related to any study intervention or treatment.   H & P and Clinical Eval for Recurrence:  Dr. Pamelia Hoit saw patient today as Dr. Al Pimple is out of the office. See Dr. Earmon Phoenix progress note dated today.   Endocrine Treatment: Patient returned her completed medication diaries for past 6 months of Letrozole. Patient reports 3 missed doses in the past 6 months. Patient states she forgot to take those doses due to various reasons.  Reviewed strategies to remember daily doses. Patient verbalized understanding of importance of taking Letrozole daily as prescribed. She was provided with 6 more months of drug diaries for ongoing documentation of letrozole intake starting today. Instructed patient the last day to complete medication diary for the study is 05/31/23 as she will have completed 5 years of ET on study on that date. Patient to continue to take letrozole daily for total of 7 years as directed by Dr. Al Pimple and Dr. Pamelia Hoit. Patient verbalized understanding.  Plan- Mammogram completed on 11/10/22 shows no evidence of  malignancy.    Visit Scheduling - Patient will return to clinic in 5 months for End of Treatment study visit with Lab and MD. This is due between 06/01/23 and 06/15/23 and will be requested to be scheduled for 06/01/23. Research nurse will also call her in 3 months to conduct study visit by phone. Patient agreed to this plan.  Patient aware that she should contact the clinic at any time if she has any questions or concerns prior to her next visit.  Patient thanked for her ongoing participation in the [place] trial.  Adverse Event- Patient reports ongoing vaginal dryness after her MD visit to research nurse. She asks if there is any medication she can take to help.  Discussed with NP, Lillard Anes, and she recommends patient to try OTC Vitamin E vaginal suppository every 3 days and increase to daily if needed. Key-E suppositories were recommended and found on [employer]. Mardella Layman also suggested patient can use coconut oil for dryness and try a Ph balanced vaginal cleanser such as Refresh. Patient instructed to let Dr. Al Pimple know if these interventions do not help.  Patient verbalized understanding.   [name] Reamer, BSN, RN, [employer] Research Nurse II [phone] 01/05/2023

## 2023-01-05 NOTE — Assessment & Plan Note (Addendum)
52 y.o. [place] woman status post left breast upper inner quadrant biopsy 01/14/2017 for a clinical T3 N2, stage IIA invasive ductal carcinoma, grade 1 or 2, estrogen and progesterone receptor positive, HER-2 nonamplified, with an MIB-1 of 20%.   (1) staging studies: Brain MRI, bone scan, and CT scan of the chest 02/05/2017 showed no brain lesions, no lung or liver lesions, a 4.9 cm mass in the left breast with left axillary and subpectoral adenopathy, and nonspecific bone scan tracer at L2, left scapula, and anterior ribs, with lumbar spine MRI suggested for further evaluation.             (a) lumbar spine MRI 02/17/2017 showed no normal bone lesions.  There was mild lumbar spondylosis   (2) neoadjuvant chemotherapy consisting of cyclophosphamide and doxorubicin in dose dense fashion 4 started 02/10/2017, completed 03/24/2017, followed by weekly carboplatin and gemcitabine given days 1 and 8 of each 21-day cycle starting 04/14/2017, completing the planned 4 cycles 06/26/2017   (3) is post left modified radical mastectomy on 08/27/2017 showing an mpT3 pN2 residual invasive ductal carcinoma, grade 2, with a residual cancer burden of 3.  Margins were clear             (a) latissimus flap reconstruction with expander 11/03/2018   (4) postmastectomy radiation completed 01/01/2018             (a) capecitabine radiosensitization 11/16/2017-01/01/2018   (5) goserelin started 09/21/2017             (a) letrozole started 01/11/2018             (b) enrolled in [name] clinical trial, randomized to ribociclib on 05/31/2018             (c) Bone density on 03/16/2018 was normal with T score of -0.7 in the L1-L4 spine   (6) genetics testing 03/24/2017 through the Common Hereditary Cancer Panel offered by [facility] found no deleterious mutations in APC, ATM, AXIN2, BARD1, BMPR1A, BRCA1, BRCA2, BRIP1, CDH1, CDKN2A (p14ARF), CDKN2A (p16INK4a), CHEK2, CTNNA1, DICER1, EPCAM (Deletion/duplication testing only), GREM1  (promoter region deletion/duplication testing only), KIT, MEN1, MLH1, MSH2, MSH3, MSH6, MUTYH, NBN, NF1, NHTL1, PALB2, PDGFRA, PMS2, POLD1, POLE, PTEN, RAD50, RAD51C, RAD51D, SDHB, SDHC, SDHD, SMAD4, SMARCA4. STK11, TP53, TSC1, TSC2, and VHL.  The following genes were evaluated for sequence changes only: SDHA and HOXB13 c.251G>A variant only.     (7) Right breast reduction and left breast expander placement on 03/03/2019 with latissimus flap placement             (a) left implant implant exchanged on 05/25/2019 or a [employer] Smooth Round Ultra High Profile Gel 750cc. Ref #[id number].  Serial Number [id number] -------------------------------------------------------------------------------------------------------------------------------------------- Current treatment: Adjuvant letrozole (completed Ribociclib as part of [name] trial for 3 years)  Letrozole toxicity: Mild hot flashes Fatigue: I would attribute this fatigue to her meniscus surgery in June as well as prediabetes and decreased physical activity.  Breast cancer surveillance: Breast exam 01/05/2023: Benign Mammogram right breast 11/11/2022: Benign breast density category C Bone density has been scheduled for August  I informed the patient that the Ribociclib medication that she took had nitrosamines which in preclinical studies was shown to have a potential risk of a new malignancy in approximately 1 and 8000 patients if the Ribociclib was taken daily at a dose of 400 mg for 70 years.  She does not have any cycles.  (Patient is postmenopausal)  Return to clinic in 6 months for follow-up

## 2023-01-05 NOTE — Telephone Encounter (Signed)
Scheduled appointment per 7/29 los. Unable to leave a voicemail due to mailbox being full.

## 2023-01-06 ENCOUNTER — Encounter: Payer: Self-pay | Admitting: Physical Therapy

## 2023-01-06 ENCOUNTER — Ambulatory Visit (INDEPENDENT_AMBULATORY_CARE_PROVIDER_SITE_OTHER): Payer: 59 | Admitting: Physical Therapy

## 2023-01-06 DIAGNOSIS — R262 Difficulty in walking, not elsewhere classified: Secondary | ICD-10-CM

## 2023-01-06 DIAGNOSIS — R600 Localized edema: Secondary | ICD-10-CM | POA: Diagnosis not present

## 2023-01-06 DIAGNOSIS — G8929 Other chronic pain: Secondary | ICD-10-CM

## 2023-01-06 DIAGNOSIS — M25561 Pain in right knee: Secondary | ICD-10-CM | POA: Diagnosis not present

## 2023-01-06 DIAGNOSIS — M6281 Muscle weakness (generalized): Secondary | ICD-10-CM | POA: Diagnosis not present

## 2023-01-06 NOTE — Therapy (Signed)
OUTPATIENT PHYSICAL THERAPY TREATMENT NOTE / PROGRESS NOTE   Patient Name: Mackenzie [name] MRN: [id number] DOB:02/07/1971, 52 y.o., female Today's Date: 01/06/2023   Progress Note Reporting Period 12/03/2022 to 12/31/2022  See note below for Objective Data and Assessment of Progress/Goals.   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, left (HCC) 01/23/2017   Edema, lower extremity    GERD (gastroesophageal reflux disease)    Healthcare maintenance 03/25/2017   Hot flashes 03/2017   Hyperlipidemia 03/20/2016   Morbid obesity with body mass index (BMI) of 40.0 to 49.9 (HCC) 09/17/2017   Pre-diabetes 07/26/2018   Hgb A1C elevated on 07/26/2018, Gestational Diabetes 2012   Seasonal allergies 2012   seasonal allergies causes allergic rhinitis and itchy, dry eyes per pt   Vitamin D deficiency 05/2015   Past Surgical History:  Procedure Laterality Date   BREAST RECONSTRUCTION WITH PLACEMENT OF TISSUE EXPANDER AND FLEX HD (ACELLULAR HYDRATED DERMIS) Left 08/27/2017   Procedure: LEFT BREAST RECONSTRUCTION WITH PLACEMENT OF TISSUE EXPANDER AND FLEX HD;  Surgeon: Peggye Form, DO;  Location: [facility];  Service: Plastics;  Laterality: Left;   CESAREAN SECTION     x2   KNEE ARTHROSCOPY WITH MEDIAL MENISECTOMY Right 11/12/2022   Procedure: RIGHT KNEE ARTHROSCOPY, PARTIAL MEDIAL MENISCECTOMY;  Surgeon: Tarry Kos, MD;  Location: [facility];  Service: Orthopedics;  Laterality: Right;   LATISSIMUS FLAP TO BREAST Left 11/03/2018   LATISSIMUS FLAP TO BREAST Left 11/03/2018   Procedure: LATISSIMUS FLAP TO LEFT BREAST;  Surgeon: Peggye Form, DO;  Location: [facility];  Service: Plastics;   Laterality: Left;   MASTECTOMY MODIFIED RADICAL Left 08/27/2017   MASTECTOMY MODIFIED RADICAL Left 08/27/2017   Procedure: LEFT MODIFIED RADICAL MASTECTOMY;  Surgeon: Harriette Bouillon, MD;  Location: [facility];  Service: General;  Laterality: Left;   MASTOPEXY Right 03/03/2019   Procedure: RIGHT BREAST MASTOPEXY/REDUCTION;  Surgeon: Peggye Form, DO;  Location: [facility];  Service: Plastics;  Laterality: Right;  3 hours, please   PORT-A-CATH REMOVAL Right 08/27/2017   Procedure: REMOVAL PORT-A-CATH RIGHT CHEST;  Surgeon: Harriette Bouillon, MD;  Location: [facility];  Service: General;  Laterality: Right;   PORTACATH PLACEMENT Right 01/28/2017   Procedure: INSERTION PORT-A-CATH WITH ULTRASOUND;  Surgeon: Harriette Bouillon, MD;  Location: [facility];  Service: General;  Laterality: Right;   REDUCTION MAMMAPLASTY Right    REMOVAL OF TISSUE EXPANDER AND PLACEMENT OF IMPLANT Left 03/03/2019   Procedure: REMOVAL OF TISSUE EXPANDER AND PLACEMENT OF EXPANDER;  Surgeon: Peggye Form, DO;  Location: [facility];  Service: Plastics;  Laterality: Left;   REMOVAL OF TISSUE EXPANDER AND PLACEMENT OF IMPLANT Left 05/25/2019   Procedure: LEFT BREAST REMOVAL OF TISSUE EXPANDER AND PLACEMENT OF IMPLANT;  Surgeon: Peggye Form, DO;  Location: [facility];  Service: Plastics;  Laterality: Left;   TISSUE EXPANDER PLACEMENT Left 11/03/2018   Procedure: PLACEMENT OF TISSUE EXPANDER LEFT BREAST;  Surgeon: Peggye Form, DO;  Location: [facility];  Service: Plastics;  Laterality: Left;  Total case time is 3.5 hours   TUBAL LIGATION Bilateral  01/21/2011   Patient Active Problem List   Diagnosis Date Noted   Acute meniscal tear, medial, right, initial encounter 11/12/2022   Plica syndrome of right knee 11/12/2022   Chondromalacia, right knee 11/12/2022   Depression 08/13/2022   BMI 40.0-44.9, adult (HCC) 08/13/2022   Obesity, Beginning BMI 08/13/2022    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   Boil of buttock, Left 12/24/2021   Hyperthyroidism 07/22/2021   Low serum thyroid stimulating hormone (TSH) 06/12/2021   Other fatigue 05/23/2021   Hemoglobin low 05/23/2021   Polyphagia 05/16/2020   Vitamin D deficiency 05/16/2020   At risk for osteoporosis 05/16/2020   Prediabetes 05/01/2020   Elevated blood pressure reading 05/01/2020   At risk for impaired metabolic function 05/01/2020   Breast asymmetry following reconstructive surgery 09/20/2019   Essential hypertension 08/17/2018   Class 3 severe obesity with serious comorbidity and body mass index (BMI) of 40.0 to 44.9 in adult [facility]) 09/17/2017   Acquired absence of left breast 09/15/2017   Malignant neoplasm of upper-inner quadrant of left breast in female, 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 MD  REFERRING PROVIDER: Cristie Hem, PA-C  REFERRING DIAG: [phone] (ICD-10-CM) - S/P right knee arthroscopy  THERAPY DIAG:  No diagnosis found.  Rationale for Evaluation and Treatment: Rehabilitation  ONSET DATE: 11/12/2022 Surgery  SUBJECTIVE:  SUBJECTIVE STATEMENT: Pt arriving reporting 3-4/10 pain today. Pt still reporting difficulty with stairs.   PERTINENT HISTORY: History of Lt breast cancer, GERD, hyperlipidemia,   PAIN:  NPRS scale: no pain upon arrival.  Pain location: Rt knee  Pain description: tightness, nagging Aggravating factors: increased WB activity, static positioning  Relieving factors: OTC medicine  PRECAUTIONS: None  WEIGHT BEARING RESTRICTIONS: No  FALLS:  Has patient fallen in last 6 months? No  LIVING ENVIRONMENT: Lives with: lives with their family Lives in:  House/apartment Stairs: bedroom on 1st floor.  Enter house:  2-3 without hand rails.  Has following equipment at home: FWW, [facility]  OCCUPATION: RN - management with desk, on floor at times.    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   MUSCLE LENGTH: 12/03/2022 No specific testing today  POSTURE:  12/03/2022 Unremarkable  PALPATION: 12/03/2022 Anterior inferior knee.   LOWER EXTREMITY ROM:   ROM Right 12/03/2022 Left 12/03/2022 Right 12/08/22 Right 12/29/22 supine Rt 01/06/23 supine  Hip flexion       Hip extension       Hip abduction       Hip adduction       Hip internal rotation       Hip external rotation       Knee flexion 107 AROM in supine heel slide with pain noted 114 AROM in supine heel slide AAROM in supine 109 deg mild pain  A: 115 deg  A: 115  Knee extension  -4 AROM in seated LAQ 0 AROM in seated LAQ  A: 0  Ankle dorsiflexion       Ankle plantarflexion       Ankle inversion       Ankle eversion        (Blank rows = not tested)  LOWER EXTREMITY MMT:  MMT Right 12/03/2022 Left 12/03/2022 Right  12/23/2022 Right 12/31/2022  Hip flexion 5/5 5/5    Hip extension      Hip abduction      Hip adduction      Hip internal rotation      Hip external rotation      Knee flexion      Knee extension 5/5 42, 42.1 lbs  5/5 68, 61.4 lbs 5/5 47.7, 46 lbs 5/5 47.5, 44 lbs  Ankle dorsiflexion      Ankle plantarflexion      Ankle inversion      Ankle eversion       (Blank rows = not tested)  LOWER EXTREMITY SPECIAL TESTS:  12/03/2022 No specific testing  FUNCTIONAL TESTS:  12/16/2022: Rt SLS:  19  seconds   12/03/2022 18 inch chair transfer: without hands on 1st try with weight shift to Lt.  Lt SLS: 8 seconds Rt SLS: < 3 seconds  GAIT: 12/10/2022:  Able to perform safe independent ambulation in clinic on level surfaces.   12/03/2022 Mod Independent with cane in Lt UE, reduced stance on Rt LE.                                                                                                                                                                          TODAY'S TREATMENT                                                                            DATE: 12/31/2022 Ther Ex: Recumbent bike: Level 4 x 8 minutes Leg press double leg 118#  x 15, Rt leg only 2 x 15  62#  Calf stretch: x 3 holding 30 sec Side stepping: 25 feet x 2 each direction Supine: heel slides x 10  Supine SLR: x 10  TherActivity Flight of stairs ascending/descending c reciprocal gait pattern with one hand rail.    Vasopneumatic Device Rt leg in supine elevation 34 deg medium compression  x 10 mins     TODAY'S TREATMENT  DATE: 12/31/2022 Ther Ex: UBE UE/LE lvl 3 8 mins, seat 11  Leg press double leg 118 lbs x 15, Rt leg only 2 x 15 56 lbs  Seated LAQ 5 lbs 2 x 15  c 2-3 sec pause in full range extension Supine bridge x 20  Supine Rt leg quad set combo with AROM heel slide 5 sec hold each x 10 each  TherActivity Flight of stairs ascending/descending c reciprocal gait pattern with one hand rail.  Verbal cues for use of hand rail even as strength improved.   Vasopneumatic Device Rt leg in supine elevation 34 deg medium compression 10 mins   TODAY'S TREATMENT                                                                            DATE: 12/29/2022  Ther Ex: Scifit bike: level 3 x 8 minutes seat 11 Step ups on 6 inch step: x 15 leading with Rt LE Rocker board: calf stretch x 2 holding 30 sec Rocker board: df/pf x 1 minute c UE  support Rocker board: inv/eversion x 1 minute c UE support Leg Press; bil 112# 2 x 10, Rt LE: 62# 2 x 10  Seated SLR: 2 x 10  NMR:  Rocker board: df/pf x 1 minute c UE support Rocker board: inv/eversion x 1 minute c UE support Tandem walking 20 feet x 2  Modalities:   Vasopneumatic Device: 11Rt leg in supine elevation 34 deg medium compression 10 mins        PATIENT EDUCATION:  Education details: rationale for interventions Person educated: Patient Education method: Programmer, multimedia, Facilities manager, Verbal cues, and Handouts Education comprehension: verbalized understanding, returned demonstration, and verbal cues required  HOME EXERCISE PROGRAM: Access Code: [email or web address] URL: [email or web address] Date: 12/03/2022 Prepared by: Chyrel Masson  Exercises - Supine Heel Slide  - 1-2 x daily - 7 x weekly - 1 sets - 10 reps - 2 hold - Supine Quadricep Sets  - 1-2 x daily - 7 x weekly - 1 sets - 10 reps - 5 hold - Seated Long Arc Quad  - 3-5 x daily - 7 x weekly - 1-2 sets - 10 reps - 2 hold - Seated Quad Set  - 3-5 x daily - 7 x weekly - 1 sets - 10 reps - 5 hold - Seated Straight Leg Heel Taps  - 1-2 x daily - 7 x weekly - 3 sets - 10 reps - Sit to Stand  - 3 x daily - 7 x weekly - 1 sets - 10 reps  ASSESSMENT:  CLINICAL IMPRESSION: Pt stating she has only been using her straight cane for longer distances, otherwise pt has been using no device for amb. Pt still requiring single hand rail for support during stair navigation. Pt's active Rt knee ROM was measured 0-115 today. Continue skilled PT interventions toward LTG's set.     OBJECTIVE IMPAIRMENTS: Abnormal gait, decreased activity tolerance, decreased balance, decreased coordination, decreased endurance, decreased mobility, difficulty walking, decreased ROM, decreased strength, increased edema, increased fascial restrictions, impaired perceived functional ability, impaired flexibility, improper body mechanics, and pain.    ACTIVITY LIMITATIONS: carrying, lifting, bending, sitting, standing, squatting, sleeping, stairs, transfers, and locomotion level  PARTICIPATION LIMITATIONS: meal prep, cleaning, interpersonal relationship, driving, shopping, community activity, and occupation  PERSONAL FACTORS:  History of Lt breast cancer, GERD, hyperlipidemia,   are also affecting patient's functional outcome.   REHAB POTENTIAL: Good  CLINICAL DECISION MAKING: Stable/uncomplicated  EVALUATION COMPLEXITY: Low   GOALS: Goals reviewed with patient? Yes  SHORT TERM GOALS: (target date for Short term goals are 3 weeks  12/24/2022)   1.  Patient will demonstrate independent use of home exercise program to maintain progress from in clinic treatments.  Goal status: Met   LONG TERM GOALS: (target dates for all long term goals are 10 weeks  02/11/2023 )   1. Patient will demonstrate/report pain at worst less than or equal to 2/10 to facilitate minimal limitation in daily activity secondary to pain symptoms.  Goal status: on going 12/31/2022   2. Patient will demonstrate independent use of home exercise program to facilitate ability to maintain/progress functional gains from skilled physical therapy services.  Goal status: on going 12/31/2022   3. Patient will demonstrate FOTO outcome > or = 62 % to indicate reduced disability due to condition.  Goal status: on going 12/31/2022   4.  Patient will demonstrate Rt LE dynamometry testing with 10 % of Lt for knee extension to faciltiate usual transfers, stairs, squatting at PLOF for daily life.   Goal status: on going 12/31/2022   5.  Patient will demonstrate independent ambulation community distances > 500 ft  Goal status: on going 12/31/2022   6.  Patient will demonstrate ascending/descending stairs reciprocally s UE assist for community integration.   Goal status: on going 12/31/2022   7.  Patient will demonstrate return to work at [name].  Goal Status: on going  12/31/2022   PLAN:  PT FREQUENCY: 1-2x/week  PT DURATION: 10 weeks  PLANNED INTERVENTIONS: Therapeutic exercises, Therapeutic activity, Neuro Muscular re-education, Balance training, Gait training, Patient/Family education, Joint mobilization, Stair training, DME instructions, Dry Needling, Electrical stimulation, Traction, Cryotherapy, vasopneumatic deviceMoist heat, Taping, Ultrasound, Ionotophoresis 4mg /ml Dexamethasone, and aquatic therapy, Manual therapy.  All included unless contraindicated  PLAN FOR NEXT SESSION:  Progressive strengthening /stair improvements for functional movements.  Consider reduced frequency in next few visits?   Narda Amber, PT, MPT 01/06/23 9:37 AM   01/06/23  9:33 AM

## 2023-01-08 ENCOUNTER — Ambulatory Visit (INDEPENDENT_AMBULATORY_CARE_PROVIDER_SITE_OTHER): Payer: 59 | Admitting: Rehabilitative and Restorative Service Providers"

## 2023-01-08 ENCOUNTER — Encounter: Payer: Self-pay | Admitting: Rehabilitative and Restorative Service Providers"

## 2023-01-08 DIAGNOSIS — G8929 Other chronic pain: Secondary | ICD-10-CM

## 2023-01-08 DIAGNOSIS — M6281 Muscle weakness (generalized): Secondary | ICD-10-CM

## 2023-01-08 DIAGNOSIS — M25561 Pain in right knee: Secondary | ICD-10-CM

## 2023-01-08 DIAGNOSIS — R262 Difficulty in walking, not elsewhere classified: Secondary | ICD-10-CM

## 2023-01-08 DIAGNOSIS — R600 Localized edema: Secondary | ICD-10-CM | POA: Diagnosis not present

## 2023-01-08 NOTE — Therapy (Signed)
OUTPATIENT PHYSICAL THERAPY TREATMENT NOTE   Patient Name: Mackenzie [name] MRN: [id number] DOB:15-Feb-1971, 52 y.o., female Today's Date: 01/08/2023    END OF SESSION:  PT End of Session - 01/08/23 1020     Visit Number 12    Number of Visits 20    Date for PT Re-Evaluation 02/11/23    Authorization Type [facility] [employer], deductible met    Progress Note Due on Visit 20    PT Start Time 1014    PT Stop Time 1104    PT Time Calculation (min) 50 min    Activity Tolerance Patient tolerated treatment well    Behavior During Therapy [facility] for tasks assessed/performed                      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   Allergic rhinitis 2012   Anemia 01/27/2017   prior to starting chemotherapy   Breast cancer (HCC) 01/14/2017   Left breast   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, left (HCC) 01/23/2017   Edema, lower extremity    GERD (gastroesophageal reflux disease)    Healthcare maintenance 03/25/2017   Hot flashes 03/2017   Hyperlipidemia 03/20/2016   Morbid obesity with body mass index (BMI) of 40.0 to 49.9 (HCC) 09/17/2017   Pre-diabetes 07/26/2018   Hgb A1C elevated on 07/26/2018, Gestational Diabetes 2012   Seasonal allergies 2012   seasonal allergies causes allergic rhinitis and itchy, dry eyes per pt   Vitamin D deficiency 05/2015   Past Surgical History:  Procedure Laterality Date   BREAST RECONSTRUCTION WITH PLACEMENT OF TISSUE EXPANDER AND FLEX HD (ACELLULAR HYDRATED DERMIS) Left 08/27/2017   Procedure: LEFT BREAST RECONSTRUCTION WITH PLACEMENT OF TISSUE EXPANDER AND FLEX HD;  Surgeon: Peggye Form, DO;  Location: [facility];  Service: Plastics;  Laterality: Left;   CESAREAN SECTION     x2   KNEE ARTHROSCOPY WITH MEDIAL MENISECTOMY Right 11/12/2022   Procedure: RIGHT KNEE ARTHROSCOPY, PARTIAL MEDIAL MENISCECTOMY;  Surgeon: Tarry Kos, MD;  Location: [facility];  Service: Orthopedics;  Laterality: Right;   LATISSIMUS FLAP TO BREAST Left 11/03/2018   LATISSIMUS FLAP TO BREAST Left 11/03/2018   Procedure: LATISSIMUS FLAP TO LEFT BREAST;  Surgeon: Peggye Form, DO;  Location: [facility];  Service: Plastics;  Laterality: Left;   MASTECTOMY MODIFIED RADICAL Left 08/27/2017   MASTECTOMY MODIFIED RADICAL Left 08/27/2017   Procedure: LEFT MODIFIED RADICAL MASTECTOMY;  Surgeon: Harriette Bouillon, MD;  Location: [facility];  Service: General;  Laterality: Left;   MASTOPEXY Right 03/03/2019   Procedure: RIGHT BREAST MASTOPEXY/REDUCTION;  Surgeon: Peggye Form, DO;  Location: [facility];  Service: Plastics;  Laterality: Right;  3 hours, please   PORT-A-CATH REMOVAL Right 08/27/2017   Procedure: REMOVAL PORT-A-CATH RIGHT CHEST;  Surgeon: Harriette Bouillon, MD;  Location: [facility];  Service: General;  Laterality: Right;   PORTACATH PLACEMENT Right 01/28/2017   Procedure: INSERTION PORT-A-CATH WITH ULTRASOUND;  Surgeon: Harriette Bouillon, MD;  Location: [facility];  Service: General;  Laterality: Right;   REDUCTION MAMMAPLASTY Right    REMOVAL OF TISSUE EXPANDER AND PLACEMENT OF IMPLANT Left 03/03/2019   Procedure: REMOVAL OF TISSUE EXPANDER AND PLACEMENT OF EXPANDER;  Surgeon: Peggye Form, DO;  Location: [facility];  Service: Plastics;  Laterality: Left;   REMOVAL OF TISSUE EXPANDER AND PLACEMENT OF  IMPLANT Left 05/25/2019   Procedure: LEFT BREAST REMOVAL OF TISSUE EXPANDER AND PLACEMENT OF IMPLANT;  Surgeon: Peggye Form, DO;  Location: [facility];  Service: Plastics;  Laterality: Left;   TISSUE EXPANDER PLACEMENT Left 11/03/2018   Procedure: PLACEMENT OF TISSUE EXPANDER LEFT BREAST;  Surgeon: Peggye Form, DO;  Location: [facility];  Service: Plastics;  Laterality: Left;  Total case time is 3.5 hours   TUBAL LIGATION Bilateral 01/21/2011   Patient Active Problem List    Diagnosis Date Noted   Acute meniscal tear, medial, right, initial encounter 11/12/2022   Plica syndrome of right knee 11/12/2022   Chondromalacia, right knee 11/12/2022   Depression 08/13/2022   BMI 40.0-44.9, adult (HCC) 08/13/2022   Obesity, Beginning BMI 08/13/2022   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   Boil of buttock, Left 12/24/2021   Hyperthyroidism 07/22/2021   Low serum thyroid stimulating hormone (TSH) 06/12/2021   Other fatigue 05/23/2021   Hemoglobin low 05/23/2021   Polyphagia 05/16/2020   Vitamin D deficiency 05/16/2020   At risk for osteoporosis 05/16/2020   Prediabetes 05/01/2020   Elevated blood pressure reading 05/01/2020   At risk for impaired metabolic function 05/01/2020   Breast asymmetry following reconstructive surgery 09/20/2019   Essential hypertension 08/17/2018   Class 3 severe obesity with serious comorbidity and body mass index (BMI) of 40.0 to 44.9 in adult [facility]) 09/17/2017   Acquired absence of left breast 09/15/2017   Malignant neoplasm of upper-inner quadrant of left breast in female, 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 MD  REFERRING PROVIDER: Cristie Hem, PA-C  REFERRING DIAG: [phone] (ICD-10-CM) - S/P right knee arthroscopy  THERAPY DIAG:  Chronic pain of right knee  Muscle weakness (generalized)  Difficulty in walking, not elsewhere classified  Localized edema  Rationale for Evaluation and Treatment: Rehabilitation  ONSET DATE: 11/12/2022 Surgery  SUBJECTIVE:  SUBJECTIVE STATEMENT: Pt indicated having 2/10 pain or so with walking.  Reported having instance of twisting of knee that created a quick pain but it got better.  Stated doing more walking without  cane as tolerated.   PERTINENT HISTORY: History of Lt breast cancer, GERD, hyperlipidemia,   PAIN:  NPRS scale: no pain upon arrival.  Pain location: Rt knee  Pain description: tightness, nagging Aggravating factors: increased WB activity, static positioning  Relieving factors: OTC medicine  PRECAUTIONS: None  WEIGHT BEARING RESTRICTIONS: No  FALLS:  Has patient fallen in last 6 months? No  LIVING ENVIRONMENT: Lives with: lives with their family Lives in: House/apartment Stairs: bedroom on 1st floor.  Enter house:  2-3 without hand rails.  Has following equipment at home: FWW, [facility]  OCCUPATION: RN - management with desk, on floor at times.    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   MUSCLE LENGTH: 12/03/2022 No specific testing today  POSTURE:  12/03/2022 Unremarkable  PALPATION: 12/03/2022 Anterior inferior knee.  LOWER EXTREMITY ROM:   ROM Right 12/03/2022 Left 12/03/2022 Right 12/08/22 Right 12/29/22 supine Rt 01/06/23 supine  Hip flexion       Hip extension       Hip abduction       Hip adduction       Hip internal rotation       Hip external rotation       Knee flexion 107 AROM in supine heel slide with pain noted 114 AROM in supine heel slide AAROM in supine 109 deg mild pain  A: 115 deg  A: 115  Knee extension  -4 AROM in seated LAQ 0 AROM in seated LAQ    A: 0  Ankle dorsiflexion       Ankle plantarflexion       Ankle inversion       Ankle eversion        (Blank rows = not tested)  LOWER EXTREMITY MMT:  MMT Right 12/03/2022 Left 12/03/2022 Right  12/23/2022 Right 12/31/2022  Hip  flexion 5/5 5/5    Hip extension      Hip abduction      Hip adduction      Hip internal rotation      Hip external rotation      Knee flexion      Knee extension 5/5 42, 42.1 lbs  5/5 68, 61.4 lbs 5/5 47.7, 46 lbs 5/5 47.5, 44 lbs  Ankle dorsiflexion      Ankle plantarflexion      Ankle inversion      Ankle eversion       (Blank rows = not tested)  LOWER EXTREMITY SPECIAL TESTS:  12/03/2022 No specific testing  FUNCTIONAL TESTS:  12/16/2022: Rt SLS:  19 seconds   12/03/2022 18 inch chair transfer: without hands on 1st try with weight shift to Lt.  Lt SLS: 8 seconds Rt SLS: < 3 seconds  GAIT: 12/10/2022:  Able to perform safe independent ambulation in clinic on level surfaces.   12/03/2022 Mod Independent with cane in Lt UE, reduced stance on Rt LE.                                                                                                                                                                          TODAY'S TREATMENT  DATE: 01/08/2023 Ther Ex: UBE UE/LE only lvl 3.0 10 mins  Leg press double leg 118#  x 15, Rt leg only 2 x 15  62#  Calf stretch incline board : x 3 holding 30 sec Supine Rt leg LAQ in 90 deg hip flexion x 15  TherActivity Step on over and down WB on Rt leg 6 inch step 2 x 10 with single hand rail assist  Neuro Re-ed Lateral stepping 3 cones x 8 , performed bilaterally   Vasopneumatic Device Rt leg in supine elevation 34 deg medium compression  x 10 mins   TODAY'S TREATMENT                                                                            DATE: 01/06/2023 Ther Ex: Recumbent bike: Level 4 x 8 minutes Leg press double leg 118#  x 15, Rt leg only 2 x 15  62#  Calf stretch: x 3 holding 30 sec Side stepping: 25 feet x 2 each direction Supine: heel slides x 10  Supine SLR: x 10  TherActivity Flight of stairs ascending/descending c reciprocal gait pattern  with one hand rail.    Vasopneumatic Device Rt leg in supine elevation 34 deg medium compression  x 10 mins    TODAY'S TREATMENT                                                                            DATE: 12/31/2022 Ther Ex: UBE UE/LE lvl 3 8 mins, seat 11  Leg press double leg 118 lbs x 15, Rt leg only 2 x 15 56 lbs  Seated LAQ 5 lbs 2 x 15  c 2-3 sec pause in full range extension Supine bridge x 20  Supine Rt leg quad set combo with AROM heel slide 5 sec hold each x 10 each  TherActivity Flight of stairs ascending/descending c reciprocal gait pattern with one hand rail.  Verbal cues for use of hand rail even as strength improved.   Vasopneumatic Device Rt leg in supine elevation 34 deg medium compression 10 mins    PATIENT EDUCATION:  Education details: rationale for interventions Person educated: Patient Education method: Programmer, multimedia, Demonstration, Verbal cues, and Handouts Education comprehension: verbalized understanding, returned demonstration, and verbal cues required  HOME EXERCISE PROGRAM: Access Code: [email or web address] URL: [email or web address] Date: 12/03/2022 Prepared by: Chyrel Masson  Exercises - Supine Heel Slide  - 1-2 x daily - 7 x weekly - 1 sets - 10 reps - 2 hold - Supine Quadricep Sets  - 1-2 x daily - 7 x weekly - 1 sets - 10 reps - 5 hold - Seated Long Arc Quad  - 3-5 x daily - 7 x weekly - 1-2 sets - 10 reps - 2 hold - Seated Quad Set  - 3-5 x daily - 7 x weekly - 1 sets - 10 reps - 5 hold - Seated Straight Leg Heel Taps  -  1-2 x daily - 7 x weekly - 3 sets - 10 reps - Sit to Stand  - 3 x daily - 7 x weekly - 1 sets - 10 reps  ASSESSMENT:  CLINICAL IMPRESSION: Fatigue noted in WB activity but rest was helpful.  Dynamic balance control fair and continued efforts to improve to help benefit and progress.  Pt desired to keep 2x/week for next week and then may switch to 1x/week frequency.  Clinician was in agreement with desire/plan.     OBJECTIVE IMPAIRMENTS: Abnormal gait, decreased activity tolerance, decreased balance, decreased coordination, decreased endurance, decreased mobility, difficulty walking, decreased ROM, decreased strength, increased edema, increased fascial restrictions, impaired perceived functional ability, impaired flexibility, improper body mechanics, and pain.   ACTIVITY LIMITATIONS: carrying, lifting, bending, sitting, standing, squatting, sleeping, stairs, transfers, and locomotion level  PARTICIPATION LIMITATIONS: meal prep, cleaning, interpersonal relationship, driving, shopping, community activity, and occupation  PERSONAL FACTORS:  History of Lt breast cancer, GERD, hyperlipidemia,   are also affecting patient's functional outcome.   REHAB POTENTIAL: Good  CLINICAL DECISION MAKING: Stable/uncomplicated  EVALUATION COMPLEXITY: Low   GOALS: Goals reviewed with patient? Yes  SHORT TERM GOALS: (target date for Short term goals are 3 weeks  12/24/2022)   1.  Patient will demonstrate independent use of home exercise program to maintain progress from in clinic treatments.  Goal status: Met   LONG TERM GOALS: (target dates for all long term goals are 10 weeks  02/11/2023 )   1. Patient will demonstrate/report pain at worst less than or equal to 2/10 to facilitate minimal limitation in daily activity secondary to pain symptoms.  Goal status: on going 12/31/2022   2. Patient will demonstrate independent use of home exercise program to facilitate ability to maintain/progress functional gains from skilled physical therapy services.  Goal status: on going 12/31/2022   3. Patient will demonstrate FOTO outcome > or = 62 % to indicate reduced disability due to condition.  Goal status: on going 12/31/2022   4.  Patient will demonstrate Rt LE dynamometry testing with 10 % of Lt for knee extension to faciltiate usual transfers, stairs, squatting at PLOF for daily life.   Goal status: on going  12/31/2022   5.  Patient will demonstrate independent ambulation community distances > 500 ft  Goal status: on going 12/31/2022   6.  Patient will demonstrate ascending/descending stairs reciprocally s UE assist for community integration.   Goal status: on going 12/31/2022   7.  Patient will demonstrate return to work at [name].  Goal Status: on going 12/31/2022   PLAN:  PT FREQUENCY: 1-2x/week  PT DURATION: 10 weeks  PLANNED INTERVENTIONS: Therapeutic exercises, Therapeutic activity, Neuro Muscular re-education, Balance training, Gait training, Patient/Family education, Joint mobilization, Stair training, DME instructions, Dry Needling, Electrical stimulation, Traction, Cryotherapy, vasopneumatic deviceMoist heat, Taping, Ultrasound, Ionotophoresis 4mg /ml Dexamethasone, and aquatic therapy, Manual therapy.  All included unless contraindicated  PLAN FOR NEXT SESSION:  Dynamic balance, WB strengthening continued to improve ambulation/stairs.    Chyrel Masson, PT, DPT, OCS, ATC 01/08/23  10:50 AM

## 2023-01-12 ENCOUNTER — Ambulatory Visit (INDEPENDENT_AMBULATORY_CARE_PROVIDER_SITE_OTHER): Payer: 59 | Admitting: Rehabilitative and Restorative Service Providers"

## 2023-01-12 ENCOUNTER — Encounter: Payer: Self-pay | Admitting: Rehabilitative and Restorative Service Providers"

## 2023-01-12 DIAGNOSIS — M6281 Muscle weakness (generalized): Secondary | ICD-10-CM | POA: Diagnosis not present

## 2023-01-12 DIAGNOSIS — M25561 Pain in right knee: Secondary | ICD-10-CM | POA: Diagnosis not present

## 2023-01-12 DIAGNOSIS — R600 Localized edema: Secondary | ICD-10-CM

## 2023-01-12 DIAGNOSIS — G8929 Other chronic pain: Secondary | ICD-10-CM

## 2023-01-12 DIAGNOSIS — R262 Difficulty in walking, not elsewhere classified: Secondary | ICD-10-CM | POA: Diagnosis not present

## 2023-01-12 NOTE — Therapy (Signed)
OUTPATIENT PHYSICAL THERAPY TREATMENT NOTE   Patient Name: Mackenzie [name] MRN: [id number] DOB:[birth year], 52 y.o., female Today's Date: 01/12/2023    END OF SESSION:  PT End of Session - 01/12/23 0838     Visit Number 13    Number of Visits 20    Date for PT Re-Evaluation 02/11/23    Authorization Type [facility] [employer], deductible met    Progress Note Due on Visit 20    PT Start Time 0838    PT Stop Time 0928    PT Time Calculation (min) 50 min    Activity Tolerance Patient tolerated treatment well    Behavior During Therapy [facility] for tasks assessed/performed               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   Allergic rhinitis 2012   Anemia 01/27/2017   prior to starting chemotherapy   Breast cancer (HCC) 01/14/2017   Left breast   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, left (HCC) 01/23/2017   Edema, lower extremity    GERD (gastroesophageal reflux disease)    Healthcare maintenance 03/25/2017   Hot flashes 03/2017   Hyperlipidemia 03/20/2016   Morbid obesity with body mass index (BMI) of 40.0 to 49.9 (HCC) 09/17/2017   Pre-diabetes 07/26/2018   Hgb A1C elevated on 07/26/2018, Gestational Diabetes 2012   Seasonal allergies 2012   seasonal allergies causes allergic rhinitis and itchy, dry eyes per pt   Vitamin D deficiency 05/2015   Past Surgical History:  Procedure Laterality Date   BREAST RECONSTRUCTION WITH PLACEMENT OF TISSUE EXPANDER AND FLEX HD (ACELLULAR HYDRATED DERMIS) Left 08/27/2017   Procedure: LEFT BREAST RECONSTRUCTION WITH PLACEMENT OF TISSUE EXPANDER AND FLEX HD;  Surgeon: Peggye Form, DO;  Location: [facility];  Service: Plastics;  Laterality: Left;   CESAREAN SECTION     x2   KNEE ARTHROSCOPY WITH MEDIAL MENISECTOMY Right 11/12/2022   Procedure: RIGHT KNEE ARTHROSCOPY, PARTIAL MEDIAL MENISCECTOMY;  Surgeon: Tarry Kos, MD;  Location: [facility];  Service: Orthopedics;  Laterality: Right;   LATISSIMUS FLAP TO BREAST Left 11/03/2018   LATISSIMUS FLAP TO BREAST Left 11/03/2018   Procedure: LATISSIMUS FLAP TO LEFT BREAST;  Surgeon: Peggye Form, DO;  Location: [facility];  Service: Plastics;  Laterality: Left;   MASTECTOMY MODIFIED RADICAL Left 08/27/2017   MASTECTOMY MODIFIED RADICAL Left 08/27/2017   Procedure: LEFT MODIFIED RADICAL MASTECTOMY;  Surgeon: Harriette Bouillon, MD;  Location: [facility];  Service: General;  Laterality: Left;   MASTOPEXY Right 03/03/2019   Procedure: RIGHT BREAST MASTOPEXY/REDUCTION;  Surgeon: Peggye Form, DO;  Location: [facility];  Service: Plastics;  Laterality: Right;  3 hours, please   PORT-A-CATH REMOVAL Right 08/27/2017   Procedure: REMOVAL PORT-A-CATH RIGHT CHEST;  Surgeon: Harriette Bouillon, MD;  Location: [facility];  Service: General;  Laterality: Right;   PORTACATH PLACEMENT Right 01/28/2017   Procedure: INSERTION PORT-A-CATH WITH ULTRASOUND;  Surgeon: Harriette Bouillon, MD;  Location: [facility];  Service: General;  Laterality: Right;   REDUCTION MAMMAPLASTY Right    REMOVAL OF TISSUE EXPANDER AND PLACEMENT OF IMPLANT Left 03/03/2019   Procedure: REMOVAL OF TISSUE EXPANDER AND PLACEMENT OF EXPANDER;  Surgeon: Peggye Form, DO;  Location: [facility];  Service: Plastics;  Laterality: Left;   REMOVAL OF TISSUE EXPANDER AND PLACEMENT OF IMPLANT Left 05/25/2019   Procedure: LEFT  BREAST REMOVAL OF TISSUE EXPANDER AND PLACEMENT OF IMPLANT;  Surgeon: Peggye Form, DO;  Location: [facility];  Service: Plastics;  Laterality: Left;   TISSUE EXPANDER PLACEMENT Left 11/03/2018   Procedure: PLACEMENT OF TISSUE EXPANDER LEFT BREAST;  Surgeon: Peggye Form, DO;  Location: [facility];  Service: Plastics;  Laterality: Left;  Total case time is 3.5 hours   TUBAL LIGATION Bilateral 01/21/2011   Patient Active Problem List   Diagnosis Date  Noted   Acute meniscal tear, medial, right, initial encounter 11/12/2022   Plica syndrome of right knee 11/12/2022   Chondromalacia, right knee 11/12/2022   Depression 08/13/2022   BMI 40.0-44.9, adult (HCC) 08/13/2022   Obesity, Beginning BMI 08/13/2022   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   Boil of buttock, Left 12/24/2021   Hyperthyroidism 07/22/2021   Low serum thyroid stimulating hormone (TSH) 06/12/2021   Other fatigue 05/23/2021   Hemoglobin low 05/23/2021   Polyphagia 05/16/2020   Vitamin D deficiency 05/16/2020   At risk for osteoporosis 05/16/2020   Prediabetes 05/01/2020   Elevated blood pressure reading 05/01/2020   At risk for impaired metabolic function 05/01/2020   Breast asymmetry following reconstructive surgery 09/20/2019   Essential hypertension 08/17/2018   Class 3 severe obesity with serious comorbidity and body mass index (BMI) of 40.0 to 44.9 in adult [facility]) 09/17/2017   Acquired absence of left breast 09/15/2017   Malignant neoplasm of upper-inner quadrant of left breast in female, 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 MD  REFERRING PROVIDER: Cristie Hem, PA-C  REFERRING DIAG: [phone] (ICD-10-CM) - S/P right knee arthroscopy  THERAPY DIAG:  Chronic pain of right knee  Muscle weakness (generalized)  Difficulty in walking, not elsewhere classified  Localized edema  Rationale for Evaluation and Treatment: Rehabilitation  ONSET DATE: 11/12/2022 Surgery  SUBJECTIVE:  SUBJECTIVE STATEMENT: Pt indicated doing fairly well.  She did some exercise in aquatic class.  Pt indicated she was going to continue to do some of the stuff in the pool.   She did report having a night from weekend  where she had to take stronger pain medicine due to not feeling.   PERTINENT HISTORY: History of Lt breast cancer, GERD, hyperlipidemia,   PAIN:  NPRS scale: upon arrival 2/10.  Pain location: Rt knee  Pain description: tightness, nagging Aggravating factors: increased WB activity, static positioning  Relieving factors: OTC medicine  PRECAUTIONS: None  WEIGHT BEARING RESTRICTIONS: No  FALLS:  Has patient fallen in last 6 months? No  LIVING ENVIRONMENT: Lives with: lives with their family Lives in: House/apartment Stairs: bedroom on 1st floor.  Enter house:  2-3 without hand rails.  Has following equipment at home: FWW, [facility]  OCCUPATION: RN - management with desk, on floor at times.    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   MUSCLE LENGTH: 12/03/2022 No specific testing today  POSTURE:  12/03/2022  Unremarkable  PALPATION: 12/03/2022 Anterior inferior knee.   LOWER EXTREMITY ROM:   ROM Right 12/03/2022 Left 12/03/2022 Right 12/08/22 Right 12/29/22 supine Rt 01/06/23 supine  Hip flexion       Hip extension       Hip abduction       Hip adduction       Hip internal rotation       Hip external rotation       Knee flexion 107 AROM in supine heel slide with pain noted 114 AROM in supine heel slide AAROM in supine 109 deg mild pain  A: 115 deg  A: 115  Knee extension  -4 AROM in seated LAQ 0 AROM in seated LAQ    A: 0  Ankle dorsiflexion       Ankle plantarflexion       Ankle inversion       Ankle eversion        (Blank rows = not tested)  LOWER EXTREMITY MMT:  MMT Right 12/03/2022  Left 12/03/2022 Right  12/23/2022 Right 12/31/2022  Hip flexion 5/5 5/5    Hip extension      Hip abduction      Hip adduction      Hip internal rotation      Hip external rotation      Knee flexion      Knee extension 5/5 42, 42.1 lbs  5/5 68, 61.4 lbs 5/5 47.7, 46 lbs 5/5 47.5, 44 lbs  Ankle dorsiflexion      Ankle plantarflexion      Ankle inversion      Ankle eversion       (Blank rows = not tested)  LOWER EXTREMITY SPECIAL TESTS:  12/03/2022 No specific testing  FUNCTIONAL TESTS:  12/16/2022: Rt SLS:  19 seconds   12/03/2022 18 inch chair transfer: without hands on 1st try with weight shift to Lt.  Lt SLS: 8 seconds Rt SLS: < 3 seconds  GAIT: 12/10/2022:  Able to perform safe independent ambulation in clinic on level surfaces.   12/03/2022 Mod Independent with cane in Lt UE, reduced stance on Rt LE.                                                                                                                                                                         TODAY'S TREATMENT  DATE: 01/12/2023 Ther Ex: UBE UE/LE lvl 3.5 8 mins  Knee extension machine double leg up, single leg lowering Rt 2 x 10 10 lbs  Leg press Rt leg only 2 x 15  62#  Calf stretch incline board : x 3 holding 30 sec Seated Rt leg LAQ x 10 for ROM loosening mid session.    Neuro Re-ed Tandem stance with blazepod reactive light touching on wall with UE bilateral 4 lights 30 sec x 2 bilaterally with SBA SLS with contralateral hurdle step over clear 9 inch hurdle x 15 bilateral occasional HHA  Vasopneumatic Device Rt leg in supine elevation 34 deg medium compression  x 10 mins    TODAY'S TREATMENT                                                                            DATE: 01/08/2023 Ther Ex: UBE UE/LE only lvl 3.0 10 mins  Leg press double leg 118#  x 15, Rt leg only 2 x 15  62#  Calf stretch incline board : x 3  holding 30 sec Supine Rt leg LAQ in 90 deg hip flexion x 15  TherActivity Step on over and down WB on Rt leg 6 inch step 2 x 10 with single hand rail assist  Neuro Re-ed Lateral stepping 3 cones x 8 , performed bilaterally   Vasopneumatic Device Rt leg in supine elevation 34 deg medium compression  x 10 mins   TODAY'S TREATMENT                                                                            DATE: 01/06/2023 Ther Ex: Recumbent bike: Level 4 x 8 minutes Leg press double leg 118#  x 15, Rt leg only 2 x 15  62#  Calf stretch: x 3 holding 30 sec Side stepping: 25 feet x 2 each direction Supine: heel slides x 10  Supine SLR: x 10  TherActivity Flight of stairs ascending/descending c reciprocal gait pattern with one hand rail.    Vasopneumatic Device Rt leg in supine elevation 34 deg medium compression  x 10 mins    TODAY'S TREATMENT                                                                            DATE: 12/31/2022 Ther Ex: UBE UE/LE lvl 3 8 mins, seat 11  Leg press double leg 118 lbs x 15, Rt leg only 2 x 15 56 lbs  Seated LAQ 5 lbs 2 x 15  c 2-3 sec pause in full range extension Supine bridge x 20  Supine Rt leg quad set combo with  AROM heel slide 5 sec hold each x 10 each  TherActivity Flight of stairs ascending/descending c reciprocal gait pattern with one hand rail.  Verbal cues for use of hand rail even as strength improved.   Vasopneumatic Device Rt leg in supine elevation 34 deg medium compression 10 mins    PATIENT EDUCATION:  Education details: rationale for interventions Person educated: Patient Education method: Programmer, multimedia, Demonstration, Verbal cues, and Handouts Education comprehension: verbalized understanding, returned demonstration, and verbal cues required  HOME EXERCISE PROGRAM: Access Code: [email or web address] URL: [email or web address] Date: 12/03/2022 Prepared by: Chyrel Masson  Exercises - Supine Heel Slide  - 1-2 x daily  - 7 x weekly - 1 sets - 10 reps - 2 hold - Supine Quadricep Sets  - 1-2 x daily - 7 x weekly - 1 sets - 10 reps - 5 hold - Seated Long Arc Quad  - 3-5 x daily - 7 x weekly - 1-2 sets - 10 reps - 2 hold - Seated Quad Set  - 3-5 x daily - 7 x weekly - 1 sets - 10 reps - 5 hold - Seated Straight Leg Heel Taps  - 1-2 x daily - 7 x weekly - 3 sets - 10 reps - Sit to Stand  - 3 x daily - 7 x weekly - 1 sets - 10 reps  ASSESSMENT:  CLINICAL IMPRESSION: WB activity still created fatigue but making some early progress.  Mixed up strengthening to allow different forms of strength gains without exacerbating symptoms in clinic. Good tolerance to new intervention overall.    OBJECTIVE IMPAIRMENTS: Abnormal gait, decreased activity tolerance, decreased balance, decreased coordination, decreased endurance, decreased mobility, difficulty walking, decreased ROM, decreased strength, increased edema, increased fascial restrictions, impaired perceived functional ability, impaired flexibility, improper body mechanics, and pain.   ACTIVITY LIMITATIONS: carrying, lifting, bending, sitting, standing, squatting, sleeping, stairs, transfers, and locomotion level  PARTICIPATION LIMITATIONS: meal prep, cleaning, interpersonal relationship, driving, shopping, community activity, and occupation  PERSONAL FACTORS:  History of Lt breast cancer, GERD, hyperlipidemia,   are also affecting patient's functional outcome.   REHAB POTENTIAL: Good  CLINICAL DECISION MAKING: Stable/uncomplicated  EVALUATION COMPLEXITY: Low   GOALS: Goals reviewed with patient? Yes  SHORT TERM GOALS: (target date for Short term goals are 3 weeks  12/24/2022)   1.  Patient will demonstrate independent use of home exercise program to maintain progress from in clinic treatments.  Goal status: Met   LONG TERM GOALS: (target dates for all long term goals are 10 weeks  02/11/2023 )   1. Patient will demonstrate/report pain at worst less than or  equal to 2/10 to facilitate minimal limitation in daily activity secondary to pain symptoms.  Goal status: on going 12/31/2022   2. Patient will demonstrate independent use of home exercise program to facilitate ability to maintain/progress functional gains from skilled physical therapy services.  Goal status: on going 12/31/2022   3. Patient will demonstrate FOTO outcome > or = 62 % to indicate reduced disability due to condition.  Goal status: on going 12/31/2022   4.  Patient will demonstrate Rt LE dynamometry testing with 10 % of Lt for knee extension to faciltiate usual transfers, stairs, squatting at PLOF for daily life.   Goal status: on going 12/31/2022   5.  Patient will demonstrate independent ambulation community distances > 500 ft  Goal status: on going 12/31/2022   6.  Patient will demonstrate ascending/descending stairs reciprocally s UE assist for community integration.   Goal  status: on going 12/31/2022   7.  Patient will demonstrate return to work at [name].  Goal Status: on going 12/31/2022   PLAN:  PT FREQUENCY: 1-2x/week  PT DURATION: 10 weeks  PLANNED INTERVENTIONS: Therapeutic exercises, Therapeutic activity, Neuro Muscular re-education, Balance training, Gait training, Patient/Family education, Joint mobilization, Stair training, DME instructions, Dry Needling, Electrical stimulation, Traction, Cryotherapy, vasopneumatic deviceMoist heat, Taping, Ultrasound, Ionotophoresis 4mg /ml Dexamethasone, and aquatic therapy, Manual therapy.  All included unless contraindicated  PLAN FOR NEXT SESSION:  Continued mixture of strengthening, progressive balance improvements.    Chyrel Masson, PT, DPT, OCS, ATC 01/12/23  9:25 AM

## 2023-01-12 NOTE — Progress Notes (Unsigned)
Name: Mackenzie [name]  MRN/ DOB: [id number], 1971-04-25    Age/ Sex: 52 y.o., female    PCP: Dois Davenport, MD   Reason for Endocrinology Evaluation:  Hyperthyroidism     Date of Initial Endocrinology Evaluation: 07/22/2021    HPI: [name] is a 52 y.o. female with a past medical history of HTN, hx of  left breast Ca . The patient presented for initial endocrinology clinic visit on 07/22/2021 for consultative assistance with her  hyperthyroidism.   Pt has been diagnosed with hyperthyroidism in 05/2021 with a suppressed TSH <0.005 uIU/mL and elevated T4  and T3 at 5.96 and 417 ng/dL respectively.    She has no hx of osteoporosis  Of note, she is s/p left mastectomy due to breast ca, followed by chemo and radiation    Methimazole was started 07/2021 with a suppressed TSH<0.005u IU/mL, elevated free T4 at 5.96 NG/DL and elevated T3 at 188 NG/DL TRAb was elevated at 9.2 IU/L  No fh of thyroid disease    SUBJECTIVE:    Today (01/13/23): [name] is here for follow-up on hyperthyroidism.   She is undergoing rehab for right knee pain, s/p right knee medial meniscal tear repair 11/2022  She continues to follow-up with oncology for history of left breast cancer  Weight continues to fluctuate  Denies palpitations  Denies tremors  Denies local neck swelling Denies constipation or diarrhea  Denies eye symptoms   Methimazole 5mg , 2 tabs Saturday and Sunday, 1 tablet rest of the week     HISTORY:  Past Medical History:  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   Allergic rhinitis 2012   Anemia 01/27/2017   prior to starting chemotherapy   Breast cancer (HCC) 01/14/2017   Left breast   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, left (HCC) 01/23/2017   Edema, lower extremity    GERD (gastroesophageal reflux disease)    Healthcare maintenance 03/25/2017   Hot flashes  03/2017   Hyperlipidemia 03/20/2016   Morbid obesity with body mass index (BMI) of 40.0 to 49.9 (HCC) 09/17/2017   Pre-diabetes 07/26/2018   Hgb A1C elevated on 07/26/2018, Gestational Diabetes 2012   Seasonal allergies 2012   seasonal allergies causes allergic rhinitis and itchy, dry eyes per pt   Vitamin D deficiency 05/2015   Past Surgical History:  Past Surgical History:  Procedure Laterality Date   BREAST RECONSTRUCTION WITH PLACEMENT OF TISSUE EXPANDER AND FLEX HD (ACELLULAR HYDRATED DERMIS) Left 08/27/2017   Procedure: LEFT BREAST RECONSTRUCTION WITH PLACEMENT OF TISSUE EXPANDER AND FLEX HD;  Surgeon: Peggye Form, DO;  Location: [facility];  Service: Plastics;  Laterality: Left;   CESAREAN SECTION     x2   KNEE ARTHROSCOPY WITH MEDIAL MENISECTOMY Right 11/12/2022   Procedure: RIGHT KNEE ARTHROSCOPY, PARTIAL MEDIAL MENISCECTOMY;  Surgeon: Tarry Kos, MD;  Location: [facility];  Service: Orthopedics;  Laterality: Right;   LATISSIMUS FLAP TO BREAST Left 11/03/2018   LATISSIMUS FLAP TO BREAST Left 11/03/2018   Procedure: LATISSIMUS FLAP TO LEFT BREAST;  Surgeon: Peggye Form, DO;  Location: [facility];  Service: Plastics;  Laterality: Left;   MASTECTOMY MODIFIED RADICAL Left 08/27/2017   MASTECTOMY MODIFIED RADICAL Left 08/27/2017   Procedure: LEFT MODIFIED RADICAL MASTECTOMY;  Surgeon: Harriette Bouillon, MD;  Location: [facility];  Service: General;  Laterality: Left;   MASTOPEXY Right 03/03/2019  Procedure: RIGHT BREAST MASTOPEXY/REDUCTION;  Surgeon: Peggye Form, DO;  Location: [facility];  Service: Plastics;  Laterality: Right;  3 hours, please   PORT-A-CATH REMOVAL Right 08/27/2017   Procedure: REMOVAL PORT-A-CATH RIGHT CHEST;  Surgeon: Harriette Bouillon, MD;  Location: [facility];  Service: General;  Laterality: Right;   PORTACATH PLACEMENT Right 01/28/2017   Procedure: INSERTION PORT-A-CATH WITH ULTRASOUND;  Surgeon: Harriette Bouillon, MD;  Location:  [facility];  Service: General;  Laterality: Right;   REDUCTION MAMMAPLASTY Right    REMOVAL OF TISSUE EXPANDER AND PLACEMENT OF IMPLANT Left 03/03/2019   Procedure: REMOVAL OF TISSUE EXPANDER AND PLACEMENT OF EXPANDER;  Surgeon: Peggye Form, DO;  Location: [facility];  Service: Plastics;  Laterality: Left;   REMOVAL OF TISSUE EXPANDER AND PLACEMENT OF IMPLANT Left 05/25/2019   Procedure: LEFT BREAST REMOVAL OF TISSUE EXPANDER AND PLACEMENT OF IMPLANT;  Surgeon: Peggye Form, DO;  Location: [facility];  Service: Plastics;  Laterality: Left;   TISSUE EXPANDER PLACEMENT Left 11/03/2018   Procedure: PLACEMENT OF TISSUE EXPANDER LEFT BREAST;  Surgeon: Peggye Form, DO;  Location: [facility];  Service: Plastics;  Laterality: Left;  Total case time is 3.5 hours   TUBAL LIGATION Bilateral 01/21/2011    Social History:  reports that she has never smoked. She has never used smokeless tobacco. She reports current alcohol use. She reports that she does not use drugs. Family History: family history includes Breast cancer (age of onset: 3) in her paternal grandmother; Diabetes in her mother and another family member; Hypertension in her mother and another family member; Kidney disease in her father; Other in her mother; Stroke in her mother and another family member.   HOME MEDICATIONS: Allergies as of 01/13/2023       Reactions   Dilaudid [hydromorphone] Itching        Medication List        Accurate as of January 13, 2023  9:06 AM. If you have any questions, ask your nurse or doctor.          acetaminophen 500 MG tablet Commonly known as: TYLENOL Take 1,000 mg by mouth every 6 (six) hours as needed (for pain/headaches.).   buPROPion 150 MG 12 hr tablet Commonly known as: Wellbutrin SR Take 1 tablet (150 mg total) by mouth daily.   calcium carbonate 500 MG chewable tablet Commonly known as: TUMS - dosed in mg elemental  calcium Chew 1 tablet by mouth 2 (two) times daily.   celecoxib 200 MG capsule Commonly known as: CELEBREX Take 1 capsule (200 mg total) by mouth 2 (two) times daily. What changed: when to take this   cetirizine 10 MG tablet Commonly known as: ZYRTEC Take 10 mg by mouth daily.   cyclobenzaprine 10 MG tablet Commonly known as: FLEXERIL Take 1/2 tablet (5 mg total) by mouth 2 (two) times daily as needed.   Fish Oil 1000 MG Caps Take 1,000 mg by mouth 2 (two) times daily.   gabapentin 300 MG capsule Commonly known as: NEURONTIN Take 1 capsule (300 mg total) by mouth 2 (two) times daily.   HYDROcodone-acetaminophen 5-325 MG tablet Commonly known as: Norco Take 1 tablet by mouth every 6 (six) hours as needed.   ibuprofen 800 MG tablet Commonly known as: ADVIL Take 800 mg by mouth every 8 (eight) hours as needed (pain).   ibuprofen 800 MG tablet Commonly known as: ADVIL Take 1 tablet (800 mg total) by  mouth every 8 (eight) hours as needed.   letrozole 2.5 MG tablet Commonly known as: FEMARA Take 1 tablet (2.5 mg total) by mouth daily.   loratadine 10 MG tablet Commonly known as: Claritin Take 1 tablet (10 mg total) by mouth daily. What changed:  when to take this reasons to take this   losartan 50 MG tablet Commonly known as: COZAAR Take 1 tablet (50 mg total) by mouth daily.   metFORMIN 500 MG tablet Commonly known as: GLUCOPHAGE Take 1 tablet (500 mg total) by mouth 2 (two) times daily with a meal.   methimazole 5 MG tablet Commonly known as: TAPAZOLE Take 1 tablet daily Monday through Friday and 2 tablets on Saturday and Sundays as directed   metoprolol succinate 50 MG 24 hr tablet Commonly known as: TOPROL-XL Take 1 tablet (50 mg total) by mouth daily. What changed: how much to take   montelukast 10 MG tablet Commonly known as: SINGULAIR Take 1 tablet (10 mg total) by mouth at bedtime.   multivitamin with minerals Tabs tablet Take 1 tablet by mouth  daily.   omeprazole 40 MG capsule Commonly known as: PRILOSEC Take 1 capsule (40 mg total) by mouth daily.   Omron 3 Series BP Monitor Devi Use as directed   ondansetron 4 MG tablet Commonly known as: Zofran Take 1 tablet (4 mg total) by mouth every 8 (eight) hours as needed for nausea or vomiting.   PROBIOTIC DAILY PO Take 1 capsule by mouth 2 (two) times daily.   Rogaine Mens 5 % Foam Generic drug: Minoxidil apply twice daily as directed   rosuvastatin 10 MG tablet Commonly known as: CRESTOR Take 1 tablet (10 mg total) by mouth daily.   spironolactone 25 MG tablet Commonly known as: ALDACTONE Take 1 tablet (25 mg total) by mouth daily.   VITAMIN C PO Take by mouth daily.   ZINC OXIDE PO Take 1 tablet by mouth 2 (two) times a day.          REVIEW OF SYSTEMS: A comprehensive ROS was conducted with the patient and is negative except as per HPI     OBJECTIVE:  VS: BP 120/80 (BP Location: Left Arm, Patient Position: Sitting, Cuff Size: Large)   Pulse 73   Ht 5\' 5"  (1.651 m)   Wt 256 lb (116.1 kg)   LMP 11/28/2016   SpO2 97%   BMI 42.60 kg/m    Wt Readings from Last 3 Encounters:  01/13/23 256 lb (116.1 kg)  01/05/23 259 lb 14.4 oz (117.9 kg)  12/24/22 250 lb (113.4 kg)     EXAM: General: Pt appears well and is in NAD  Neck:  Thyroid: Thyroid gland is prominent  Lungs: Clear with good BS bilat   Heart: Auscultation: RRR.  Abdomen:  soft, nontender  Extremities:  BL LE: No pretibial edema   Mental Status: Judgment, insight: Intact Orientation: Oriented to time, place, and person Mood and affect: No depression, anxiety, or agitation     DATA REVIEWED:   Latest Reference Range & Units 01/13/23 09:16  TSH 0.35 - 5.50 uIU/mL 3.81  T4,Free(Direct) 0.60 - 1.60 ng/dL 1.61      Latest Reference Range & Units 01/05/23 08:46  Sodium 135 - 145 mmol/L 136  Potassium 3.5 - 5.1 mmol/L 4.2  Chloride 98 - 111 mmol/L 104  CO2 22 - 32 mmol/L 28   Glucose 70 - 99 mg/dL 096 (H)  BUN 6 - 20 mg/dL 13  Creatinine 0.45 -  1.00 mg/dL 4.25  Calcium 8.9 - 95.6 mg/dL 9.3  Anion gap 5 - 15  4 (L)  Alkaline Phosphatase 38 - 126 U/L 107  Albumin 3.5 - 5.0 g/dL 4.0  AST 15 - 41 U/L 16  ALT 0 - 44 U/L 16  Total Protein 6.5 - 8.1 g/dL 6.9  Total Bilirubin 0.3 - 1.2 mg/dL 0.3  GFR, Est Non African American >60 mL/min >60    Latest Reference Range & Units 01/05/23 08:46  WBC 4.0 - 10.5 K/uL 4.8  RBC 3.87 - 5.11 MIL/uL 4.40  Hemoglobin 12.0 - 15.0 g/dL 38.7  HCT 56.4 - 33.2 % 37.9  MCV 80.0 - 100.0 fL 86.1  MCH 26.0 - 34.0 pg 28.2  MCHC 30.0 - 36.0 g/dL 95.1  RDW 88.4 - 16.6 % 14.5  Platelets 150 - 400 K/uL 205  nRBC 0.0 - 0.2 % 0.0  Neutrophils % 52  Lymphocytes % 34  Monocytes Relative % 11  Eosinophil % 2  Basophil % 1  Immature Granulocytes % 0  NEUT# 1.7 - 7.7 K/uL 2.5  Lymphocyte # 0.7 - 4.0 K/uL 1.6  Monocyte # 0.1 - 1.0 K/uL 0.5  Eosinophils Absolute 0.0 - 0.5 K/uL 0.1  Basophils Absolute 0.0 - 0.1 K/uL 0.0  Abs Immature Granulocytes 0.00 - 0.07 K/uL 0.01      TRAB <=2.00 IU/L 9.20 High    ASSESSMENT/PLAN/RECOMMENDATIONS:   Hyperthyroidism:   -Patient is clinically euthyroid -No local neck symptoms -Patient has been noted provide thyroid asymmetry on exam today, will proceed with thyroid ultrasound -TFTs remain normal, we have room to decrease methimazole    Medication Decrease methimazole 5 mg, 1 tablet daily  2.  Graves' disease:   -No extrathyroidal manifestation of Graves' disease   Follow-up in 6 months   Signed electronically by: Lyndle Herrlich, MD  [facility] Endocrinology  [facility] [address] [place], [place] [address] Phone: [phone] FAX: [phone]   CC: Dois Davenport, MD [address] [place] [address] Phone: [phone] Fax: [phone]   Return to Endocrinology clinic as below: Future Appointments  Date Time  Provider Department Center  01/14/2023  8:45 AM Chyrel Masson B, PT [facility] None  01/19/2023  9:00 AM Julaine Fusi, NP [facility] None  01/20/2023  8:45 AM Chyrel Masson B, PT [facility] None  01/22/2023  8:45 AM Chyrel Masson B, PT [facility] None  01/27/2023  8:45 AM Chyrel Masson B, PT [facility] None  02/03/2023  8:30 AM [facility] DX DEXA 1 GI-BCGDG GI-BREAST CE  02/03/2023  9:30 AM Chyrel Masson B, PT [facility] None  02/04/2023  8:30 AM Tarry Kos, MD [facility] None  02/16/2023  8:30 AM Julaine Fusi, NP [facility] None  03/23/2023  9:00 AM Alphonzo Cruise, PTA [facility] None  03/30/2023  9:40 AM Chilton Si, MD [facility]CVD [facility]  05/12/2023  8:45 AM Dillingham, Alena Bills, DO [facility]SS[facility] None  06/09/2023  8:45 AM [facility] LAB [facility] None  06/09/2023  9:30 AM Rachel Moulds, MD [facility] None

## 2023-01-13 ENCOUNTER — Ambulatory Visit: Payer: 59 | Admitting: Internal Medicine

## 2023-01-13 ENCOUNTER — Encounter: Payer: Self-pay | Admitting: Internal Medicine

## 2023-01-13 ENCOUNTER — Other Ambulatory Visit (HOSPITAL_COMMUNITY): Payer: Self-pay

## 2023-01-13 VITALS — BP 120/80 | HR 73 | Ht 65.0 in | Wt 256.0 lb

## 2023-01-13 DIAGNOSIS — E0500 Thyrotoxicosis with diffuse goiter without thyrotoxic crisis or storm: Secondary | ICD-10-CM

## 2023-01-13 DIAGNOSIS — E0590 Thyrotoxicosis, unspecified without thyrotoxic crisis or storm: Secondary | ICD-10-CM | POA: Diagnosis not present

## 2023-01-13 LAB — T4, FREE: Free T4: 1.23 ng/dL (ref 0.60–1.60)

## 2023-01-13 LAB — TSH: TSH: 3.81 u[IU]/mL (ref 0.35–5.50)

## 2023-01-13 MED ORDER — METHIMAZOLE 5 MG PO TABS
5.0000 mg | ORAL_TABLET | Freq: Every day | ORAL | 3 refills | Status: DC
  Filled 2023-01-13 – 2023-03-05 (×2): qty 90, 90d supply, fill #0
  Filled 2023-06-07: qty 90, 90d supply, fill #1

## 2023-01-14 ENCOUNTER — Other Ambulatory Visit (HOSPITAL_COMMUNITY): Payer: Self-pay

## 2023-01-14 ENCOUNTER — Encounter: Payer: Self-pay | Admitting: Internal Medicine

## 2023-01-14 ENCOUNTER — Encounter: Payer: Self-pay | Admitting: Rehabilitative and Restorative Service Providers"

## 2023-01-14 ENCOUNTER — Ambulatory Visit (INDEPENDENT_AMBULATORY_CARE_PROVIDER_SITE_OTHER): Payer: 59 | Admitting: Rehabilitative and Restorative Service Providers"

## 2023-01-14 DIAGNOSIS — M6281 Muscle weakness (generalized): Secondary | ICD-10-CM

## 2023-01-14 DIAGNOSIS — G8929 Other chronic pain: Secondary | ICD-10-CM | POA: Diagnosis not present

## 2023-01-14 DIAGNOSIS — R262 Difficulty in walking, not elsewhere classified: Secondary | ICD-10-CM

## 2023-01-14 DIAGNOSIS — M25561 Pain in right knee: Secondary | ICD-10-CM

## 2023-01-14 DIAGNOSIS — R600 Localized edema: Secondary | ICD-10-CM

## 2023-01-14 NOTE — Therapy (Signed)
OUTPATIENT PHYSICAL THERAPY TREATMENT NOTE   Patient Name: Mackenzie [name] MRN: [id number] DOB:1970-10-21, 52 y.o., female Today's Date: 01/14/2023    END OF SESSION:  PT End of Session - 01/14/23 0839     Visit Number 14    Number of Visits 20    Date for PT Re-Evaluation 02/11/23    Authorization Type [facility] [employer], deductible met    Progress Note Due on Visit 20    PT Start Time 0839    PT Stop Time 0929    PT Time Calculation (min) 50 min    Activity Tolerance Patient tolerated treatment well    Behavior During Therapy [facility] for tasks assessed/performed                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   Allergic rhinitis 2012   Anemia 01/27/2017   prior to starting chemotherapy   Breast cancer (HCC) 01/14/2017   Left breast   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, left (HCC) 01/23/2017   Edema, lower extremity    GERD (gastroesophageal reflux disease)    Healthcare maintenance 03/25/2017   Hot flashes 03/2017   Hyperlipidemia 03/20/2016   Morbid obesity with body mass index (BMI) of 40.0 to 49.9 (HCC) 09/17/2017   Pre-diabetes 07/26/2018   Hgb A1C elevated on 07/26/2018, Gestational Diabetes 2012   Seasonal allergies 2012   seasonal allergies causes allergic rhinitis and itchy, dry eyes per pt   Vitamin D deficiency 05/2015   Past Surgical History:  Procedure Laterality Date   BREAST RECONSTRUCTION WITH PLACEMENT OF TISSUE EXPANDER AND FLEX HD (ACELLULAR HYDRATED DERMIS) Left 08/27/2017   Procedure: LEFT BREAST RECONSTRUCTION WITH PLACEMENT OF TISSUE EXPANDER AND FLEX HD;  Surgeon: Peggye Form, DO;  Location: [facility];  Service: Plastics;  Laterality: Left;   CESAREAN SECTION     x2   KNEE ARTHROSCOPY WITH MEDIAL MENISECTOMY Right 11/12/2022   Procedure: RIGHT KNEE ARTHROSCOPY, PARTIAL MEDIAL MENISCECTOMY;  Surgeon: Tarry Kos, MD;  Location: [facility];  Service: Orthopedics;  Laterality: Right;   LATISSIMUS FLAP TO BREAST Left 11/03/2018   LATISSIMUS FLAP TO BREAST Left 11/03/2018   Procedure: LATISSIMUS FLAP TO LEFT BREAST;  Surgeon: Peggye Form, DO;  Location: [facility];  Service: Plastics;  Laterality: Left;   MASTECTOMY MODIFIED RADICAL Left 08/27/2017   MASTECTOMY MODIFIED RADICAL Left 08/27/2017   Procedure: LEFT MODIFIED RADICAL MASTECTOMY;  Surgeon: Harriette Bouillon, MD;  Location: [facility];  Service: General;  Laterality: Left;   MASTOPEXY Right 03/03/2019   Procedure: RIGHT BREAST MASTOPEXY/REDUCTION;  Surgeon: Peggye Form, DO;  Location: [facility];  Service: Plastics;  Laterality: Right;  3 hours, please   PORT-A-CATH REMOVAL Right 08/27/2017   Procedure: REMOVAL PORT-A-CATH RIGHT CHEST;  Surgeon: Harriette Bouillon, MD;  Location: [facility];  Service: General;  Laterality: Right;   PORTACATH PLACEMENT Right 01/28/2017   Procedure: INSERTION PORT-A-CATH WITH ULTRASOUND;  Surgeon: Harriette Bouillon, MD;  Location: [facility];  Service: General;  Laterality: Right;   REDUCTION MAMMAPLASTY Right    REMOVAL OF TISSUE EXPANDER AND PLACEMENT OF IMPLANT Left 03/03/2019   Procedure: REMOVAL OF TISSUE EXPANDER AND PLACEMENT OF EXPANDER;  Surgeon: Peggye Form, DO;  Location: [facility];  Service: Plastics;  Laterality: Left;   REMOVAL OF TISSUE EXPANDER AND PLACEMENT OF IMPLANT Left 05/25/2019   Procedure:  LEFT BREAST REMOVAL OF TISSUE EXPANDER AND PLACEMENT OF IMPLANT;  Surgeon: Peggye Form, DO;  Location: [facility];  Service: Plastics;  Laterality: Left;   TISSUE EXPANDER PLACEMENT Left 11/03/2018   Procedure: PLACEMENT OF TISSUE EXPANDER LEFT BREAST;  Surgeon: Peggye Form, DO;  Location: [facility];  Service: Plastics;  Laterality: Left;  Total case time is 3.5 hours   TUBAL LIGATION Bilateral 01/21/2011   Patient Active Problem List   Diagnosis Date  Noted   Acute meniscal tear, medial, right, initial encounter 11/12/2022   Plica syndrome of right knee 11/12/2022   Chondromalacia, right knee 11/12/2022   Depression 08/13/2022   BMI 40.0-44.9, adult (HCC) 08/13/2022   Obesity, Beginning BMI 08/13/2022   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   Boil of buttock, Left 12/24/2021   Hyperthyroidism 07/22/2021   Low serum thyroid stimulating hormone (TSH) 06/12/2021   Other fatigue 05/23/2021   Hemoglobin low 05/23/2021   Polyphagia 05/16/2020   Vitamin D deficiency 05/16/2020   At risk for osteoporosis 05/16/2020   Prediabetes 05/01/2020   Elevated blood pressure reading 05/01/2020   At risk for impaired metabolic function 05/01/2020   Breast asymmetry following reconstructive surgery 09/20/2019   Essential hypertension 08/17/2018   Class 3 severe obesity with serious comorbidity and body mass index (BMI) of 40.0 to 44.9 in adult [facility]) 09/17/2017   Acquired absence of left breast 09/15/2017   Malignant neoplasm of upper-inner quadrant of left breast in female, 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 MD  REFERRING PROVIDER: Cristie Hem, PA-C  REFERRING DIAG: [phone] (ICD-10-CM) - S/P right knee arthroscopy  THERAPY DIAG:  Chronic pain of right knee  Muscle weakness (generalized)  Difficulty in walking, not elsewhere classified  Localized edema  Rationale for Evaluation and Treatment: Rehabilitation  ONSET DATE: 11/12/2022 Surgery  SUBJECTIVE:  SUBJECTIVE STATEMENT: She indicated doing better today with less pain.  Reported going to the pool again.   PERTINENT HISTORY: History of Lt breast cancer, GERD, hyperlipidemia,   PAIN:  NPRS scale: upon  arrival 2/10.  Pain location: Rt knee  Pain description: tightness, nagging Aggravating factors: increased WB activity, static positioning  Relieving factors: OTC medicine  PRECAUTIONS: None  WEIGHT BEARING RESTRICTIONS: No  FALLS:  Has patient fallen in last 6 months? No  LIVING ENVIRONMENT: Lives with: lives with their family Lives in: House/apartment Stairs: bedroom on 1st floor.  Enter house:  2-3 without hand rails.  Has following equipment at home: FWW, [facility]  OCCUPATION: RN - management with desk, on floor at times.    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   MUSCLE LENGTH: 12/03/2022 No specific testing today  POSTURE:  12/03/2022 Unremarkable  PALPATION: 12/03/2022 Anterior inferior knee.   LOWER EXTREMITY ROM:   ROM Right 12/03/2022 Left 12/03/2022 Right 12/08/22 Right 12/29/22 supine Rt 01/06/23 supine  Hip flexion  Hip extension       Hip abduction       Hip adduction       Hip internal rotation       Hip external rotation       Knee flexion 107 AROM in supine heel slide with pain noted 114 AROM in supine heel slide AAROM in supine 109 deg mild pain  A: 115 deg  A: 115  Knee extension  -4 AROM in seated LAQ 0 AROM in seated LAQ    A: 0  Ankle dorsiflexion       Ankle plantarflexion       Ankle inversion       Ankle eversion        (Blank rows = not tested)  LOWER EXTREMITY MMT:  MMT Right 12/03/2022 Left 12/03/2022 Right  12/23/2022 Right 12/31/2022  Hip flexion 5/5 5/5    Hip extension      Hip abduction      Hip adduction      Hip internal rotation      Hip external  rotation      Knee flexion      Knee extension 5/5 42, 42.1 lbs  5/5 68, 61.4 lbs 5/5 47.7, 46 lbs 5/5 47.5, 44 lbs  Ankle dorsiflexion      Ankle plantarflexion      Ankle inversion      Ankle eversion       (Blank rows = not tested)  LOWER EXTREMITY SPECIAL TESTS:  12/03/2022 No specific testing  FUNCTIONAL TESTS:  12/16/2022: Rt SLS:  19 seconds   12/03/2022 18 inch chair transfer: without hands on 1st try with weight shift to Lt.  Lt SLS: 8 seconds Rt SLS: < 3 seconds  GAIT: 01/14/2023:  Independent ambulation in clinic.  Fatigue with activity reduced stance phase on Rt leg.   12/10/2022:  Able to perform safe independent ambulation in clinic on level surfaces.   12/03/2022 Mod Independent with cane in Lt UE, reduced stance on Rt LE.                                                                                                                                                                         TODAY'S TREATMENT                                                                            DATE: 01/14/2023 Ther Ex: UBE UE/LE lvl  3.5 10 mins  Knee extension machine double leg up, single leg lowering Rt 3 x 10 10 lbs - additional time for slow control movement focus Calf stretch incline board : x 3 holding 30 sec Supine Rt knee LAQ in 90 deg hip flexion x 10   Neuro Re-ed Tandem stance on foam 1 min x 1 bilateral without hand touch  SLS with contralateral leg touching corners of black mat x 8 each corner, bilaterally with occasional HHA  Vasopneumatic Device Rt leg in supine elevation 34 deg medium compression  x 10 mins   TODAY'S TREATMENT                                                                            DATE: 01/12/2023 Ther Ex: UBE UE/LE lvl 3.5 8 mins  Knee extension machine double leg up, single leg lowering Rt 2 x 10 10 lbs  Leg press Rt leg only 2 x 15  62#  Calf stretch incline board : x 3 holding 30 sec Seated Rt leg LAQ x 10 for ROM loosening mid  session.    Neuro Re-ed Tandem stance with blazepod reactive light touching on wall with UE bilateral 4 lights 30 sec x 2 bilaterally with SBA SLS with contralateral hurdle step over clear 9 inch hurdle x 15 bilateral occasional HHA  Vasopneumatic Device Rt leg in supine elevation 34 deg medium compression  x 10 mins    TODAY'S TREATMENT                                                                            DATE: 01/08/2023 Ther Ex: UBE UE/LE only lvl 3.0 10 mins  Leg press double leg 118#  x 15, Rt leg only 2 x 15  62#  Calf stretch incline board : x 3 holding 30 sec Supine Rt leg LAQ in 90 deg hip flexion x 15  TherActivity Step on over and down WB on Rt leg 6 inch step 2 x 10 with single hand rail assist  Neuro Re-ed Lateral stepping 3 cones x 8 , performed bilaterally   Vasopneumatic Device Rt leg in supine elevation 34 deg medium compression  x 10 mins   TODAY'S TREATMENT                                                                            DATE: 01/06/2023 Ther Ex: Recumbent bike: Level 4 x 8 minutes Leg press double leg 118#  x 15, Rt leg only 2 x 15  62#  Calf stretch: x 3 holding 30 sec Side stepping: 25 feet x 2 each direction Supine: heel slides  x 10  Supine SLR: x 10  TherActivity Flight of stairs ascending/descending c reciprocal gait pattern with one hand rail.    Vasopneumatic Device Rt leg in supine elevation 34 deg medium compression  x 10 mins   PATIENT EDUCATION:  Education details: rationale for interventions Person educated: Patient Education method: Programmer, multimedia, Demonstration, Verbal cues, and Handouts Education comprehension: verbalized understanding, returned demonstration, and verbal cues required  HOME EXERCISE PROGRAM: Access Code: [email or web address] URL: [email or web address] Date: 12/03/2022 Prepared by: Chyrel Masson  Exercises - Supine Heel Slide  - 1-2 x daily - 7 x weekly - 1 sets - 10 reps - 2 hold - Supine Quadricep  Sets  - 1-2 x daily - 7 x weekly - 1 sets - 10 reps - 5 hold - Seated Long Arc Quad  - 3-5 x daily - 7 x weekly - 1-2 sets - 10 reps - 2 hold - Seated Quad Set  - 3-5 x daily - 7 x weekly - 1 sets - 10 reps - 5 hold - Seated Straight Leg Heel Taps  - 1-2 x daily - 7 x weekly - 3 sets - 10 reps - Sit to Stand  - 3 x daily - 7 x weekly - 1 sets - 10 reps  ASSESSMENT:  CLINICAL IMPRESSION: Fatigue still noted and impacts stability and sequencing in gait.  Plan to continue to work strength gains and movement coordination improvements.     OBJECTIVE IMPAIRMENTS: Abnormal gait, decreased activity tolerance, decreased balance, decreased coordination, decreased endurance, decreased mobility, difficulty walking, decreased ROM, decreased strength, increased edema, increased fascial restrictions, impaired perceived functional ability, impaired flexibility, improper body mechanics, and pain.   ACTIVITY LIMITATIONS: carrying, lifting, bending, sitting, standing, squatting, sleeping, stairs, transfers, and locomotion level  PARTICIPATION LIMITATIONS: meal prep, cleaning, interpersonal relationship, driving, shopping, community activity, and occupation  PERSONAL FACTORS:  History of Lt breast cancer, GERD, hyperlipidemia,   are also affecting patient's functional outcome.   REHAB POTENTIAL: Good  CLINICAL DECISION MAKING: Stable/uncomplicated  EVALUATION COMPLEXITY: Low   GOALS: Goals reviewed with patient? Yes  SHORT TERM GOALS: (target date for Short term goals are 3 weeks  12/24/2022)   1.  Patient will demonstrate independent use of home exercise program to maintain progress from in clinic treatments.  Goal status: Met   LONG TERM GOALS: (target dates for all long term goals are 10 weeks  02/11/2023 )   1. Patient will demonstrate/report pain at worst less than or equal to 2/10 to facilitate minimal limitation in daily activity secondary to pain symptoms.  Goal status: on going 12/31/2022    2. Patient will demonstrate independent use of home exercise program to facilitate ability to maintain/progress functional gains from skilled physical therapy services.  Goal status: on going 12/31/2022   3. Patient will demonstrate FOTO outcome > or = 62 % to indicate reduced disability due to condition.  Goal status: on going 12/31/2022   4.  Patient will demonstrate Rt LE dynamometry testing with 10 % of Lt for knee extension to faciltiate usual transfers, stairs, squatting at PLOF for daily life.   Goal status: on going 12/31/2022   5.  Patient will demonstrate independent ambulation community distances > 500 ft  Goal status: on going 12/31/2022   6.  Patient will demonstrate ascending/descending stairs reciprocally s UE assist for community integration.   Goal status: on going 12/31/2022   7.  Patient will demonstrate return to work at [name].  Goal Status: on going 12/31/2022  PLAN:  PT FREQUENCY: 1-2x/week  PT DURATION: 10 weeks  PLANNED INTERVENTIONS: Therapeutic exercises, Therapeutic activity, Neuro Muscular re-education, Balance training, Gait training, Patient/Family education, Joint mobilization, Stair training, DME instructions, Dry Needling, Electrical stimulation, Traction, Cryotherapy, vasopneumatic deviceMoist heat, Taping, Ultrasound, Ionotophoresis 4mg /ml Dexamethasone, and aquatic therapy, Manual therapy.  All included unless contraindicated  PLAN FOR NEXT SESSION:  ROM or MMT updates.   Chyrel Masson, PT, DPT, OCS, ATC 01/14/23  9:25 AM

## 2023-01-19 ENCOUNTER — Ambulatory Visit (INDEPENDENT_AMBULATORY_CARE_PROVIDER_SITE_OTHER): Payer: 59 | Admitting: Adult Health

## 2023-01-19 ENCOUNTER — Encounter (INDEPENDENT_AMBULATORY_CARE_PROVIDER_SITE_OTHER): Payer: Self-pay | Admitting: Adult Health

## 2023-01-19 ENCOUNTER — Other Ambulatory Visit (HOSPITAL_COMMUNITY): Payer: Self-pay

## 2023-01-19 VITALS — BP 108/70 | HR 63 | Temp 98.0°F | Ht 65.0 in | Wt 251.0 lb

## 2023-01-19 DIAGNOSIS — E669 Obesity, unspecified: Secondary | ICD-10-CM | POA: Diagnosis not present

## 2023-01-19 DIAGNOSIS — F3289 Other specified depressive episodes: Secondary | ICD-10-CM | POA: Diagnosis not present

## 2023-01-19 DIAGNOSIS — Z9889 Other specified postprocedural states: Secondary | ICD-10-CM

## 2023-01-19 DIAGNOSIS — Z6841 Body Mass Index (BMI) 40.0 and over, adult: Secondary | ICD-10-CM | POA: Diagnosis not present

## 2023-01-19 DIAGNOSIS — E559 Vitamin D deficiency, unspecified: Secondary | ICD-10-CM | POA: Diagnosis not present

## 2023-01-19 DIAGNOSIS — R7303 Prediabetes: Secondary | ICD-10-CM | POA: Diagnosis not present

## 2023-01-19 MED ORDER — BUPROPION HCL ER (SR) 150 MG PO TB12
150.0000 mg | ORAL_TABLET | Freq: Every day | ORAL | 0 refills | Status: DC
Start: 2023-01-19 — End: 2023-04-13
  Filled 2023-01-19: qty 90, 90d supply, fill #0

## 2023-01-19 MED ORDER — METFORMIN HCL 500 MG PO TABS
500.0000 mg | ORAL_TABLET | Freq: Two times a day (BID) | ORAL | 0 refills | Status: DC
Start: 2023-01-19 — End: 2023-04-13
  Filled 2023-01-19: qty 180, 90d supply, fill #0

## 2023-01-19 NOTE — Progress Notes (Signed)
WEIGHT SUMMARY AND BIOMETRICS  Vitals Temp: 98 F (36.7 C) BP: 108/70 Pulse Rate: 63 SpO2: 98 %   Anthropometric Measurements Height: 5\' 5"  (1.651 m) Weight: 251 lb (113.9 kg) BMI (Calculated): 41.77 Weight at Last Visit: 250lb Weight Lost Since Last Visit: 0 Weight Gained Since Last Visit: 1lb Starting Weight: 267lb Total Weight Loss (lbs): 16 lb (7.258 kg)   Body Composition  Body Fat %: 48.5 % Fat Mass (lbs): 121.8 lbs Muscle Mass (lbs): 122.8 lbs Total Body Water (lbs): 86.8 lbs Visceral Fat Rating : 15   Other Clinical Data Fasting: Yes Labs: no Today's Visit #: 4 Starting Date: 08/09/19    Chief Complaint:   OBESITY Mackenzie [name] is here to discuss her progress with her obesity treatment plan. She is on the the Category 3 Plan and states she is following her eating plan approximately 75 % of the time. She states she is exercising Physical Therapy, Aquatic Therapy, Walking 30 minutes 3 times per week.   Interim History:  Mackenzie [name] started attending Free Aquatic Therapy classes at [employer] on [facility] Saturday afternoons.   She has attended 2 classes and she has enjoyed the non-impact exercise.  Her daughter will start school at [employer] next week.  Mackenzie [name] is slowly increasing daily walking, does not require use of any assistive devices at present.  Subjective:   1. Pre-diabetes Lab Results  Component Value Date   HGBA1C 5.8 (H) 09/03/2022   HGBA1C 5.4 01/29/2022   HGBA1C 5.4 12/19/2021  She is on Metformin 500mg  BID with meals- denies GI upset. Last dose of Wegovy 1.7mg  was on/about 06/24/2022- insurance will not cover additional therapy.  2. S/P right knee arthroscopy 11/12/2022 Hospital Note Chief Complaint: right knee medial meniscal tear HPI: Mackenzie [name] is a 52 y.o. female who presents for surgical treatment of right knee medial meniscal tear.  She denies any changes in medical history. 12/25/2022 Ortho Notes: Plan:  Mackenzie [name] is a 52 year old female 6-week status post right knee scope.  She feels that things are improving overall but still feels little bit of weakness in the knee.  She is using a cane for ambulating long distances.  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  She still doing physical therapy.   Examination of the right knee shows fully healed surgical scars.  Range of motion is progressing nicely.  She has a little bit of an effusion.  No joint line tenderness.   Based on findings I feel that the weakness symptoms that she is feeling is probably due to the effusion.  I offered a knee aspiration cortisone injection but she would like to try oral NSAIDs for now.  She will pick up a knee compression sleeve as well.  Will continue the work note to work from home as needed for the next 6 weeks.  Recheck in 6 weeks.   Follow-Up Instructions: Return in about 6 weeks (around 02/05/2023).  3. Emotional Eating Behavior She reports stable appetite- currently on Metformin therapy. Last dose of Wegovy 1.7mg  was on/about 06/24/2022- insurance will not cover additional therapy.  Assessment/Plan:   1. Pre-diabetes Refill - metFORMIN (GLUCOPHAGE) 500 MG tablet; Take 1 tablet (500 mg total) by mouth 2 (two) times daily with a meal.  Dispense: 180 tablet; Refill: 0 Check Labs - Hemoglobin A1c - Insulin, random - Magnesium  2. S/P right knee arthroscopy Continue regular exercise  3. Emotional Eating Behavior  Refill - buPROPion (WELLBUTRIN SR) 150 MG 12 hr tablet; Take 1 tablet (150 mg total) by mouth daily.  Dispense: 90 tablet; Refill: 0  4. Vitamin D deficiency Check Labs - VITAMIN D 25 Hydroxy (Vit-D Deficiency, Fractures)  5. Obesity, Current BMI 41.77  Mackenzie [name] is currently in the action stage of change. As such, her goal is to continue with weight loss efforts. She has agreed to the Category 3 Plan.   Exercise goals: For substantial health benefits, adults should do  at least 150 minutes (2 hours and 30 minutes) a week of moderate-intensity, or 75 minutes (1 hour and 15 minutes) a week of vigorous-intensity aerobic physical activity, or an equivalent combination of moderate- and vigorous-intensity aerobic activity. Aerobic activity should be performed in episodes of at least 10 minutes, and preferably, it should be spread throughout the week.  Behavioral modification strategies: increasing lean protein intake, decreasing simple carbohydrates, increasing vegetables, increasing water intake, no skipping meals, meal planning and cooking strategies, avoiding temptations, and planning for success.  Mackenzie [name] has agreed to follow-up with our clinic in 4 weeks. She was informed of the importance of frequent follow-up visits to maximize her success with intensive lifestyle modifications for her multiple health conditions.   Mackenzie [name] was informed we would discuss her lab results at her next visit unless there is a critical issue that needs to be addressed sooner. Mackenzie [name] agreed to keep her next visit at the agreed upon time to discuss these results.  Objective:   Blood pressure 108/70, pulse 63, temperature 98 F (36.7 C), height 5\' 5"  (1.651 m), weight 251 lb (113.9 kg), last menstrual period 11/28/2016, SpO2 98%, unknown if currently breastfeeding. Body mass index is 41.77 kg/m.  General: Cooperative, alert, well developed, in no acute distress. HEENT: Conjunctivae and lids unremarkable. Cardiovascular: Regular rhythm.  Lungs: Normal work of breathing. Neurologic: No focal deficits.   Lab Results  Component Value Date   CREATININE 0.80 01/05/2023   BUN 13 01/05/2023   NA 136 01/05/2023   K 4.2 01/05/2023   CL 104 01/05/2023   CO2 28 01/05/2023   Lab Results  Component Value Date   ALT 16 01/05/2023   AST 16 01/05/2023   GGT 19 03/01/2021   ALKPHOS 107 01/05/2023   BILITOT 0.3 01/05/2023   Lab Results  Component Value Date   HGBA1C 5.8 (H) 09/03/2022    HGBA1C 5.4 01/29/2022   HGBA1C 5.4 12/19/2021   HGBA1C 5.4 05/23/2021   HGBA1C 5.4 02/27/2021   Lab Results  Component Value Date   INSULIN 13.1 09/03/2022   INSULIN 21.3 01/29/2022   INSULIN 18.2 01/07/2022   INSULIN 15.1 05/23/2021   INSULIN 15.1 02/27/2021   Lab Results  Component Value Date   TSH 3.81 01/13/2023   Lab Results  Component Value Date   CHOL 202 (H) 11/15/2020   HDL 58 11/15/2020   LDLCALC 135 (H) 11/15/2020   TRIG 52 11/15/2020   CHOLHDL 3.5 11/15/2020   Lab Results  Component Value Date   VD25OH 81.2 09/03/2022   VD25OH 50.2 01/29/2022   VD25OH 49.2 12/19/2021   Lab Results  Component Value Date   WBC 4.8 01/05/2023   HGB 12.4 01/05/2023   HCT 37.9 01/05/2023   MCV 86.1 01/05/2023   PLT 205 01/05/2023   No results found for: "IRON", "TIBC", "FERRITIN"  Attestation Statements:   Reviewed by clinician on day of visit: allergies, medications, problem list, medical history, surgical history, family history, social history, and previous  encounter notes.  I have reviewed the above documentation for accuracy and completeness, and I agree with the above. -   d. , NP-C

## 2023-01-20 ENCOUNTER — Ambulatory Visit
Admission: RE | Admit: 2023-01-20 | Discharge: 2023-01-20 | Disposition: A | Discharge status: Home / Self Care | Payer: 59 | Source: Ambulatory Visit | Attending: Internal Medicine | Admitting: Internal Medicine

## 2023-01-20 ENCOUNTER — Encounter: Payer: Self-pay | Admitting: Rehabilitative and Restorative Service Providers"

## 2023-01-20 ENCOUNTER — Ambulatory Visit (INDEPENDENT_AMBULATORY_CARE_PROVIDER_SITE_OTHER): Payer: 59 | Admitting: Rehabilitative and Restorative Service Providers"

## 2023-01-20 DIAGNOSIS — G8929 Other chronic pain: Secondary | ICD-10-CM | POA: Diagnosis not present

## 2023-01-20 DIAGNOSIS — M6281 Muscle weakness (generalized): Secondary | ICD-10-CM | POA: Diagnosis not present

## 2023-01-20 DIAGNOSIS — R262 Difficulty in walking, not elsewhere classified: Secondary | ICD-10-CM

## 2023-01-20 DIAGNOSIS — E042 Nontoxic multinodular goiter: Secondary | ICD-10-CM | POA: Diagnosis not present

## 2023-01-20 DIAGNOSIS — R600 Localized edema: Secondary | ICD-10-CM

## 2023-01-20 DIAGNOSIS — E0590 Thyrotoxicosis, unspecified without thyrotoxic crisis or storm: Secondary | ICD-10-CM

## 2023-01-20 DIAGNOSIS — M25561 Pain in right knee: Secondary | ICD-10-CM | POA: Diagnosis not present

## 2023-01-20 NOTE — Therapy (Signed)
OUTPATIENT PHYSICAL THERAPY TREATMENT NOTE   Patient Name: Mackenzie [name] MRN: [id number] DOB:[birth year], 52 y.o., female Today's Date: 01/20/2023    END OF SESSION:  PT End of Session - 01/20/23 0839     Visit Number 15    Number of Visits 20    Date for PT Re-Evaluation 02/11/23    Authorization Type [facility] [employer], deductible met    Progress Note Due on Visit 20    PT Start Time 0842    PT Stop Time 0931    PT Time Calculation (min) 49 min    Activity Tolerance Patient tolerated treatment well    Behavior During Therapy [facility] for tasks assessed/performed                 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   Allergic rhinitis 2012   Anemia 01/27/2017   prior to starting chemotherapy   Breast cancer (HCC) 01/14/2017   Left breast   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, left (HCC) 01/23/2017   Edema, lower extremity    GERD (gastroesophageal reflux disease)    Healthcare maintenance 03/25/2017   Hot flashes 03/2017   Hyperlipidemia 03/20/2016   Morbid obesity with body mass index (BMI) of 40.0 to 49.9 (HCC) 09/17/2017   Pre-diabetes 07/26/2018   Hgb A1C elevated on 07/26/2018, Gestational Diabetes 2012   Seasonal allergies 2012   seasonal allergies causes allergic rhinitis and itchy, dry eyes per pt   Vitamin D deficiency 05/2015   Past Surgical History:  Procedure Laterality Date   BREAST RECONSTRUCTION WITH PLACEMENT OF TISSUE EXPANDER AND FLEX HD (ACELLULAR HYDRATED DERMIS) Left 08/27/2017   Procedure: LEFT BREAST RECONSTRUCTION WITH PLACEMENT OF TISSUE EXPANDER AND FLEX HD;  Surgeon: Peggye Form, DO;  Location: [facility];  Service: Plastics;  Laterality: Left;   CESAREAN SECTION     x2   KNEE ARTHROSCOPY WITH MEDIAL MENISECTOMY Right 11/12/2022   Procedure: RIGHT KNEE ARTHROSCOPY, PARTIAL MEDIAL MENISCECTOMY;  Surgeon: Tarry Kos, MD;  Location: [facility];  Service: Orthopedics;  Laterality: Right;   LATISSIMUS FLAP TO BREAST Left 11/03/2018   LATISSIMUS FLAP TO BREAST Left 11/03/2018   Procedure: LATISSIMUS FLAP TO LEFT BREAST;  Surgeon: Peggye Form, DO;  Location: [facility];  Service: Plastics;  Laterality: Left;   MASTECTOMY MODIFIED RADICAL Left 08/27/2017   MASTECTOMY MODIFIED RADICAL Left 08/27/2017   Procedure: LEFT MODIFIED RADICAL MASTECTOMY;  Surgeon: Harriette Bouillon, MD;  Location: [facility];  Service: General;  Laterality: Left;   MASTOPEXY Right 03/03/2019   Procedure: RIGHT BREAST MASTOPEXY/REDUCTION;  Surgeon: Peggye Form, DO;  Location: [facility];  Service: Plastics;  Laterality: Right;  3 hours, please   PORT-A-CATH REMOVAL Right 08/27/2017   Procedure: REMOVAL PORT-A-CATH RIGHT CHEST;  Surgeon: Harriette Bouillon, MD;  Location: [facility];  Service: General;  Laterality: Right;   PORTACATH PLACEMENT Right 01/28/2017   Procedure: INSERTION PORT-A-CATH WITH ULTRASOUND;  Surgeon: Harriette Bouillon, MD;  Location: [facility];  Service: General;  Laterality: Right;   REDUCTION MAMMAPLASTY Right    REMOVAL OF TISSUE EXPANDER AND PLACEMENT OF IMPLANT Left 03/03/2019   Procedure: REMOVAL OF TISSUE EXPANDER AND PLACEMENT OF EXPANDER;  Surgeon: Peggye Form, DO;  Location: [facility];  Service: Plastics;  Laterality: Left;   REMOVAL OF TISSUE EXPANDER AND PLACEMENT OF IMPLANT Left 05/25/2019  Procedure: LEFT BREAST REMOVAL OF TISSUE EXPANDER AND PLACEMENT OF IMPLANT;  Surgeon: Peggye Form, DO;  Location: [facility];  Service: Plastics;  Laterality: Left;   TISSUE EXPANDER PLACEMENT Left 11/03/2018   Procedure: PLACEMENT OF TISSUE EXPANDER LEFT BREAST;  Surgeon: Peggye Form, DO;  Location: [facility];  Service: Plastics;  Laterality: Left;  Total case time is 3.5 hours   TUBAL LIGATION Bilateral 01/21/2011   Patient Active Problem List   Diagnosis Date  Noted   Acute meniscal tear, medial, right, initial encounter 11/12/2022   Plica syndrome of right knee 11/12/2022   Chondromalacia, right knee 11/12/2022   Depression 08/13/2022   BMI 40.0-44.9, adult (HCC) 08/13/2022   Obesity, Beginning BMI 08/13/2022   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   Boil of buttock, Left 12/24/2021   Hyperthyroidism 07/22/2021   Low serum thyroid stimulating hormone (TSH) 06/12/2021   Other fatigue 05/23/2021   Hemoglobin low 05/23/2021   Polyphagia 05/16/2020   Vitamin D deficiency 05/16/2020   At risk for osteoporosis 05/16/2020   Prediabetes 05/01/2020   Elevated blood pressure reading 05/01/2020   At risk for impaired metabolic function 05/01/2020   Breast asymmetry following reconstructive surgery 09/20/2019   Essential hypertension 08/17/2018   Class 3 severe obesity with serious comorbidity and body mass index (BMI) of 40.0 to 44.9 in adult [facility]) 09/17/2017   Acquired absence of left breast 09/15/2017   Malignant neoplasm of upper-inner quadrant of left breast in female, 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 MD  REFERRING PROVIDER: Cristie Hem, PA-C  REFERRING DIAG: [phone] (ICD-10-CM) - S/P right knee arthroscopy  THERAPY DIAG:  Chronic pain of right knee  Muscle weakness (generalized)  Difficulty in walking, not elsewhere classified  Localized edema  Rationale for Evaluation and Treatment: Rehabilitation  ONSET DATE: 11/12/2022 Surgery  SUBJECTIVE:  SUBJECTIVE STATEMENT: Pt indicated having stiffness when sitting prolonged yesterday.  Sitting time was about 2.5 hours or so.  Had to use cane to walk some afterwards til it improved. She indicated a twist  occasionally that can catch pain.   PERTINENT HISTORY: History of Lt breast cancer, GERD, hyperlipidemia,   PAIN:  NPRS scale: no specific pain upon arrival.  Pain location: Rt knee  Pain description: tightness, nagging Aggravating factors: increased WB activity, static positioning  Relieving factors: OTC medicine  PRECAUTIONS: None  WEIGHT BEARING RESTRICTIONS: No  FALLS:  Has patient fallen in last 6 months? No  LIVING ENVIRONMENT: Lives with: lives with their family Lives in: House/apartment Stairs: bedroom on 1st floor.  Enter house:  2-3 without hand rails.  Has following equipment at home: FWW, [facility]  OCCUPATION: RN - management with desk, on floor at times.    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   MUSCLE LENGTH: 12/03/2022 No specific testing today  POSTURE:  12/03/2022 Unremarkable  PALPATION: 12/03/2022 Anterior inferior knee.   LOWER  EXTREMITY ROM:   ROM Right 12/03/2022 Left 12/03/2022 Right 12/08/22 Right 12/29/22 supine Rt 01/06/23 supine Rt 01/20/2023  Hip flexion        Hip extension        Hip abduction        Hip adduction        Hip internal rotation        Hip external rotation        Knee flexion 107 AROM in supine heel slide with pain noted 114 AROM in supine heel slide AAROM in supine 109 deg mild pain  A: 115 deg  A: 115   Knee extension  -4 AROM in seated LAQ 0 AROM in seated LAQ    A: 0 0 deg in AROM LAQ  Ankle dorsiflexion        Ankle plantarflexion        Ankle inversion        Ankle eversion         (Blank rows = not tested)  LOWER EXTREMITY MMT:  MMT  Right 12/03/2022 Left 12/03/2022 Right  12/23/2022 Right 12/31/2022  Hip flexion 5/5 5/5    Hip extension      Hip abduction      Hip adduction      Hip internal rotation      Hip external rotation      Knee flexion      Knee extension 5/5 42, 42.1 lbs  5/5 68, 61.4 lbs 5/5 47.7, 46 lbs 5/5 47.5, 44 lbs  Ankle dorsiflexion      Ankle plantarflexion      Ankle inversion      Ankle eversion       (Blank rows = not tested)  LOWER EXTREMITY SPECIAL TESTS:  12/03/2022 No specific testing  FUNCTIONAL TESTS:  12/16/2022: Rt SLS:  19 seconds   12/03/2022 18 inch chair transfer: without hands on 1st try with weight shift to Lt.  Lt SLS: 8 seconds Rt SLS: < 3 seconds  GAIT: 01/14/2023:  Independent ambulation in clinic.  Fatigue with activity reduced stance phase on Rt leg.   12/10/2022:  Able to perform safe independent ambulation in clinic on level surfaces.   12/03/2022 Mod Independent with cane in Lt UE, reduced stance on Rt LE.                                                                                                                                                                         TODAY'S TREATMENT  DATE: 01/20/2023 Ther Ex: Recumbent bike lvl 2 8 mins, seat 6 Lateral step down control 4 inch step 2 x 15 bilateral Step on over and down WB on Rt leg 4 inch step x 15  Seated SLR 1 lbs 2 x 15 bilaterally  Supine Rt leg LAQ in 90 deg hip flexion 2 x 15  Neuro Re-ed SLS with contralateral leg touching corners of black mat x 8 each corner, bilaterally with occasional HHA Fitter rocker board fwd/back light touching focus x 25 in // bars   Vasopneumatic Device Rt leg in supine elevation 34 deg medium compression  x 10 mins   TODAY'S TREATMENT                                                                            DATE: 01/14/2023 Ther Ex: UBE UE/LE lvl 3.5 10 mins  Knee extension machine double  leg up, single leg lowering Rt 3 x 10 10 lbs - additional time for slow control movement focus Calf stretch incline board : x 3 holding 30 sec Supine Rt knee LAQ in 90 deg hip flexion x 10   Neuro Re-ed Tandem stance on foam 1 min x 1 bilateral without hand touch  SLS with contralateral leg touching corners of black mat x 8 each corner, bilaterally with occasional HHA  Vasopneumatic Device Rt leg in supine elevation 34 deg medium compression  x 10 mins   TODAY'S TREATMENT                                                                            DATE: 01/12/2023 Ther Ex: UBE UE/LE lvl 3.5 8 mins  Knee extension machine double leg up, single leg lowering Rt 2 x 10 10 lbs  Leg press Rt leg only 2 x 15  62#  Calf stretch incline board : x 3 holding 30 sec Seated Rt leg LAQ x 10 for ROM loosening mid session.    Neuro Re-ed Tandem stance with blazepod reactive light touching on wall with UE bilateral 4 lights 30 sec x 2 bilaterally with SBA SLS with contralateral hurdle step over clear 9 inch hurdle x 15 bilateral occasional HHA  Vasopneumatic Device Rt leg in supine elevation 34 deg medium compression  x 10 mins    TODAY'S TREATMENT                                                                            DATE: 01/08/2023 Ther Ex: UBE UE/LE only lvl 3.0 10 mins  Leg press double leg 118#  x 15, Rt leg only 2 x 15  62#  Calf stretch incline  board : x 3 holding 30 sec Supine Rt leg LAQ in 90 deg hip flexion x 15  TherActivity Step on over and down WB on Rt leg 6 inch step 2 x 10 with single hand rail assist  Neuro Re-ed Lateral stepping 3 cones x 8 , performed bilaterally   Vasopneumatic Device Rt leg in supine elevation 34 deg medium compression  x 10 mins    PATIENT EDUCATION:  Education details: rationale for interventions Person educated: Patient Education method: Programmer, multimedia, Demonstration, Verbal cues, and Handouts Education comprehension: verbalized understanding,  returned demonstration, and verbal cues required  HOME EXERCISE PROGRAM: Access Code: [email or web address] URL: [email or web address] Date: 12/03/2022 Prepared by: Chyrel Masson  Exercises - Supine Heel Slide  - 1-2 x daily - 7 x weekly - 1 sets - 10 reps - 2 hold - Supine Quadricep Sets  - 1-2 x daily - 7 x weekly - 1 sets - 10 reps - 5 hold - Seated Long Arc Quad  - 3-5 x daily - 7 x weekly - 1-2 sets - 10 reps - 2 hold - Seated Quad Set  - 3-5 x daily - 7 x weekly - 1 sets - 10 reps - 5 hold - Seated Straight Leg Heel Taps  - 1-2 x daily - 7 x weekly - 3 sets - 10 reps - Sit to Stand  - 3 x daily - 7 x weekly - 1 sets - 10 reps  ASSESSMENT:  CLINICAL IMPRESSION: Improvement in WB control noted in step activity on reduced height.   Continued strengthening and balance improvements to help improve stability in ambulation and loading during day activity.    OBJECTIVE IMPAIRMENTS: Abnormal gait, decreased activity tolerance, decreased balance, decreased coordination, decreased endurance, decreased mobility, difficulty walking, decreased ROM, decreased strength, increased edema, increased fascial restrictions, impaired perceived functional ability, impaired flexibility, improper body mechanics, and pain.   ACTIVITY LIMITATIONS: carrying, lifting, bending, sitting, standing, squatting, sleeping, stairs, transfers, and locomotion level  PARTICIPATION LIMITATIONS: meal prep, cleaning, interpersonal relationship, driving, shopping, community activity, and occupation  PERSONAL FACTORS:  History of Lt breast cancer, GERD, hyperlipidemia,   are also affecting patient's functional outcome.   REHAB POTENTIAL: Good  CLINICAL DECISION MAKING: Stable/uncomplicated  EVALUATION COMPLEXITY: Low   GOALS: Goals reviewed with patient? Yes  SHORT TERM GOALS: (target date for Short term goals are 3 weeks  12/24/2022)   1.  Patient will demonstrate independent use of home exercise program to  maintain progress from in clinic treatments.  Goal status: Met   LONG TERM GOALS: (target dates for all long term goals are 10 weeks  02/11/2023 )   1. Patient will demonstrate/report pain at worst less than or equal to 2/10 to facilitate minimal limitation in daily activity secondary to pain symptoms.  Goal status: on going 12/31/2022   2. Patient will demonstrate independent use of home exercise program to facilitate ability to maintain/progress functional gains from skilled physical therapy services.  Goal status: on going 12/31/2022   3. Patient will demonstrate FOTO outcome > or = 62 % to indicate reduced disability due to condition.  Goal status: on going 12/31/2022   4.  Patient will demonstrate Rt LE dynamometry testing with 10 % of Lt for knee extension to faciltiate usual transfers, stairs, squatting at PLOF for daily life.   Goal status: on going 12/31/2022   5.  Patient will demonstrate independent ambulation community distances > 500 ft  Goal status: on going 12/31/2022   6.  Patient will demonstrate ascending/descending stairs reciprocally s UE assist for community integration.   Goal status: on going 12/31/2022   7.  Patient will demonstrate return to work at [name].  Goal Status: on going 12/31/2022   PLAN:  PT FREQUENCY: 1-2x/week  PT DURATION: 10 weeks  PLANNED INTERVENTIONS: Therapeutic exercises, Therapeutic activity, Neuro Muscular re-education, Balance training, Gait training, Patient/Family education, Joint mobilization, Stair training, DME instructions, Dry Needling, Electrical stimulation, Traction, Cryotherapy, vasopneumatic deviceMoist heat, Taping, Ultrasound, Ionotophoresis 4mg /ml Dexamethasone, and aquatic therapy, Manual therapy.  All included unless contraindicated  PLAN FOR NEXT SESSION:  MMT updates Donzetta Matters, PT, DPT, OCS, ATC 01/20/23  9:21 AM

## 2023-01-22 ENCOUNTER — Encounter: Payer: Self-pay | Admitting: Physical Therapy

## 2023-01-22 ENCOUNTER — Ambulatory Visit (INDEPENDENT_AMBULATORY_CARE_PROVIDER_SITE_OTHER): Payer: 59 | Admitting: Physical Therapy

## 2023-01-22 DIAGNOSIS — M25561 Pain in right knee: Secondary | ICD-10-CM

## 2023-01-22 DIAGNOSIS — G8929 Other chronic pain: Secondary | ICD-10-CM

## 2023-01-22 DIAGNOSIS — M6281 Muscle weakness (generalized): Secondary | ICD-10-CM | POA: Diagnosis not present

## 2023-01-22 DIAGNOSIS — R262 Difficulty in walking, not elsewhere classified: Secondary | ICD-10-CM | POA: Diagnosis not present

## 2023-01-22 DIAGNOSIS — R600 Localized edema: Secondary | ICD-10-CM

## 2023-01-22 NOTE — Therapy (Signed)
OUTPATIENT PHYSICAL THERAPY TREATMENT NOTE   Patient Name: Mackenzie [name] MRN: [id number] DOB:Sep 19, 1970, 52 y.o., female Today's Date: 01/22/2023    END OF SESSION:  PT End of Session - 01/22/23 0844     Visit Number 16    Number of Visits 20    Date for PT Re-Evaluation 02/11/23    Authorization Type [facility] [employer], deductible met    Progress Note Due on Visit 20    PT Start Time 0844    PT Stop Time 0927    PT Time Calculation (min) 43 min    Activity Tolerance Patient tolerated treatment well    Behavior During Therapy [facility] for tasks assessed/performed                  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   Allergic rhinitis 2012   Anemia 01/27/2017   prior to starting chemotherapy   Breast cancer (HCC) 01/14/2017   Left breast   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, left (HCC) 01/23/2017   Edema, lower extremity    GERD (gastroesophageal reflux disease)    Healthcare maintenance 03/25/2017   Hot flashes 03/2017   Hyperlipidemia 03/20/2016   Morbid obesity with body mass index (BMI) of 40.0 to 49.9 (HCC) 09/17/2017   Pre-diabetes 07/26/2018   Hgb A1C elevated on 07/26/2018, Gestational Diabetes 2012   Seasonal allergies 2012   seasonal allergies causes allergic rhinitis and itchy, dry eyes per pt   Vitamin D deficiency 05/2015   Past Surgical History:  Procedure Laterality Date   BREAST RECONSTRUCTION WITH PLACEMENT OF TISSUE EXPANDER AND FLEX HD (ACELLULAR HYDRATED DERMIS) Left 08/27/2017   Procedure: LEFT BREAST RECONSTRUCTION WITH PLACEMENT OF TISSUE EXPANDER AND FLEX HD;  Surgeon: Peggye Form, DO;  Location: [facility];  Service: Plastics;  Laterality: Left;   CESAREAN SECTION     x2   KNEE ARTHROSCOPY WITH MEDIAL MENISECTOMY Right 11/12/2022   Procedure: RIGHT KNEE ARTHROSCOPY, PARTIAL MEDIAL MENISCECTOMY;  Surgeon: Tarry Kos, MD;  Location: [facility];  Service: Orthopedics;  Laterality: Right;   LATISSIMUS FLAP TO BREAST Left 11/03/2018   LATISSIMUS FLAP TO BREAST Left 11/03/2018   Procedure: LATISSIMUS FLAP TO LEFT BREAST;  Surgeon: Peggye Form, DO;  Location: [facility];  Service: Plastics;  Laterality: Left;   MASTECTOMY MODIFIED RADICAL Left 08/27/2017   MASTECTOMY MODIFIED RADICAL Left 08/27/2017   Procedure: LEFT MODIFIED RADICAL MASTECTOMY;  Surgeon: Harriette Bouillon, MD;  Location: [facility];  Service: General;  Laterality: Left;   MASTOPEXY Right 03/03/2019   Procedure: RIGHT BREAST MASTOPEXY/REDUCTION;  Surgeon: Peggye Form, DO;  Location: [facility];  Service: Plastics;  Laterality: Right;  3 hours, please   PORT-A-CATH REMOVAL Right 08/27/2017   Procedure: REMOVAL PORT-A-CATH RIGHT CHEST;  Surgeon: Harriette Bouillon, MD;  Location: [facility];  Service: General;  Laterality: Right;   PORTACATH PLACEMENT Right 01/28/2017   Procedure: INSERTION PORT-A-CATH WITH ULTRASOUND;  Surgeon: Harriette Bouillon, MD;  Location: [facility];  Service: General;  Laterality: Right;   REDUCTION MAMMAPLASTY Right    REMOVAL OF TISSUE EXPANDER AND PLACEMENT OF IMPLANT Left 03/03/2019   Procedure: REMOVAL OF TISSUE EXPANDER AND PLACEMENT OF EXPANDER;  Surgeon: Peggye Form, DO;  Location: [facility];  Service: Plastics;  Laterality: Left;   REMOVAL OF TISSUE EXPANDER AND PLACEMENT OF IMPLANT Left 05/25/2019  Procedure: LEFT BREAST REMOVAL OF TISSUE EXPANDER AND PLACEMENT OF IMPLANT;  Surgeon: Peggye Form, DO;  Location: [facility];  Service: Plastics;  Laterality: Left;   TISSUE EXPANDER PLACEMENT Left 11/03/2018   Procedure: PLACEMENT OF TISSUE EXPANDER LEFT BREAST;  Surgeon: Peggye Form, DO;  Location: [facility];  Service: Plastics;  Laterality: Left;  Total case time is 3.5 hours   TUBAL LIGATION Bilateral 01/21/2011   Patient Active Problem List   Diagnosis  Date Noted   Acute meniscal tear, medial, right, initial encounter 11/12/2022   Plica syndrome of right knee 11/12/2022   Chondromalacia, right knee 11/12/2022   Depression 08/13/2022   BMI 40.0-44.9, adult (HCC) 08/13/2022   Obesity, Beginning BMI 08/13/2022   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   Boil of buttock, Left 12/24/2021   Hyperthyroidism 07/22/2021   Low serum thyroid stimulating hormone (TSH) 06/12/2021   Other fatigue 05/23/2021   Hemoglobin low 05/23/2021   Polyphagia 05/16/2020   Vitamin D deficiency 05/16/2020   At risk for osteoporosis 05/16/2020   Prediabetes 05/01/2020   Elevated blood pressure reading 05/01/2020   At risk for impaired metabolic function 05/01/2020   Breast asymmetry following reconstructive surgery 09/20/2019   Essential hypertension 08/17/2018   Class 3 severe obesity with serious comorbidity and body mass index (BMI) of 40.0 to 44.9 in adult [facility]) 09/17/2017   Acquired absence of left breast 09/15/2017   Malignant neoplasm of upper-inner quadrant of left breast in female, 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 MD  REFERRING PROVIDER: Cristie Hem, PA-C  REFERRING DIAG: [phone] (ICD-10-CM) - S/P right knee arthroscopy  THERAPY DIAG:  Chronic pain of right knee  Muscle weakness (generalized)  Difficulty in walking, not elsewhere classified  Localized edema  Rationale for Evaluation and Treatment: Rehabilitation  ONSET DATE: 11/12/2022 Surgery  SUBJECTIVE:  SUBJECTIVE STATEMENT: She has been doing her exercises.  Her knee is overall getting better but when she sits too long it is hard to get up.     PERTINENT HISTORY: History of Lt breast cancer, GERD,  hyperlipidemia,   PAIN:  NPRS scale: no specific pain upon arrival. Maybe 1/10 more stiffness than pain.  Up to 8/10 after she sat for 2 hours.  Pain location: Rt knee  Pain description: tightness, nagging Aggravating factors: increased WB activity, static positioning  Relieving factors: OTC medicine  PRECAUTIONS: None  WEIGHT BEARING RESTRICTIONS: No  FALLS:  Has patient fallen in last 6 months? No  LIVING ENVIRONMENT: Lives with: lives with their family Lives in: House/apartment Stairs: bedroom on 1st floor.  Enter house:  2-3 without hand rails.  Has following equipment at home: FWW, [facility]  OCCUPATION: RN - management with desk, on floor at times.    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   MUSCLE LENGTH: 12/03/2022 No specific testing today  POSTURE:  12/03/2022 Unremarkable  PALPATION: 12/03/2022 Anterior inferior  knee.   LOWER EXTREMITY ROM:   ROM Right 12/03/2022 Left 12/03/2022 Right 12/08/22 Right 12/29/22 supine Rt 01/06/23 supine Rt 01/20/2023  Hip flexion        Hip extension        Hip abduction        Hip adduction        Hip internal rotation        Hip external rotation        Knee flexion 107 AROM in supine heel slide with pain noted 114 AROM in supine heel slide AAROM in supine 109 deg mild pain  A: 115 deg  A: 115   Knee extension  -4 AROM in seated LAQ 0 AROM in seated LAQ    A: 0 0 deg in AROM LAQ  Ankle dorsiflexion        Ankle plantarflexion        Ankle inversion        Ankle eversion         (Blank rows = not tested)  LOWER EXTREMITY MMT:  MMT Right 12/03/22  Left 12/03/22 Right  12/23/22 Right 12/31/22 RIght 01/22/23  Hip flexion 5/5 5/5     Hip extension       Hip abduction       Hip adduction       Hip internal rotation       Hip external rotation       Knee flexion       Knee extension 5/5 42, 42.1 lbs  5/5 68, 61.4 lbs 5/5 47.7, 46 lbs 5/5 47.5, 44 lbs 48.3# & 45.4#  Ankle dorsiflexion       Ankle plantarflexion       Ankle inversion       Ankle eversion        (Blank rows = not tested)  LOWER EXTREMITY SPECIAL TESTS:  12/03/2022 No specific testing  FUNCTIONAL TESTS:  12/16/2022: Rt SLS:  19 seconds   12/03/2022 18 inch chair transfer: without hands on 1st try with weight shift to Lt.  Lt SLS: 8 seconds Rt SLS: < 3 seconds  GAIT: 01/14/2023:  Independent ambulation in clinic.  Fatigue with activity reduced stance phase on Rt leg.   12/10/2022:  Able to perform safe independent ambulation in clinic on level surfaces.   12/03/2022 Mod Independent with cane in Lt UE, reduced stance on Rt LE.                                                                                                                                                                         TODAY'S TREATMENT  DATE: 01/22/2023 Ther Ex: Recumbent bike lvl 2 8 mins, seat 6 While pt rode bike, PT demo & verbal cues on changing LE positioning while sitting to decrease "stiffness" with prolonged sitting and intermittent standing. Pt verbalized understanding.  Lateral step down control 4 inch step 2 x 15 bilateral; PT demo & verbal cues on weight shift so directly over stance LE. Step on over and down WB on Rt leg 4 inch step x 15; PT demo & verbal cues on weight shift so directly over stance LE. Seated SLR 1 lbs 2 x 15 bilaterally  Stairs 11 steps alternating pattern 2 rails for 6 steps and left rail for 5 steps with SBA. PT demo & verbal cues prior to activity.   Neuro Re-ed SLS with  contralateral leg touching corners of black mat x 4 each corner and 4 reps on foam mat, bilaterally with occasional touch //bars Fitter rocker square board with 2 pivots fwd/back & right/left light touching focus x 25  in // bars without UE support; progressed to round board with single pivot fwd/back & right/left light touching focus x 10 reps with intermittent touch //bars  Vasopneumatic Device Rt leg in supine elevation 34 deg medium compression  x 10 mins   TREATMENT                                                                            DATE: 01/20/2023 Ther Ex: Recumbent bike lvl 2 8 mins, seat 6 Lateral step down control 4 inch step 2 x 15 bilateral Step on over and down WB on Rt leg 4 inch step x 15  Seated SLR 1 lbs 2 x 15 bilaterally  Supine Rt leg LAQ in 90 deg hip flexion 2 x 15  Neuro Re-ed SLS with contralateral leg touching corners of black mat x 8 each corner, bilaterally with occasional HHA Fitter rocker board fwd/back light touching focus x 25 in // bars   Vasopneumatic Device Rt leg in supine elevation 34 deg medium compression  x 10 mins   TREATMENT                                                                            DATE: 01/14/2023 Ther Ex: UBE UE/LE lvl 3.5 10 mins  Knee extension machine double leg up, single leg lowering Rt 3 x 10 10 lbs - additional time for slow control movement focus Calf stretch incline board : x 3 holding 30 sec Supine Rt knee LAQ in 90 deg hip flexion x 10   Neuro Re-ed Tandem stance on foam 1 min x 1 bilateral without hand touch  SLS with contralateral leg touching corners of black mat x 8 each corner, bilaterally with occasional HHA  Vasopneumatic Device Rt leg in supine elevation 34 deg medium compression  x 10 mins     PATIENT EDUCATION:  Education details: rationale for interventions Person educated: Patient Education method: Explanation,  Demonstration, Verbal cues, and Handouts Education comprehension: verbalized  understanding, returned demonstration, and verbal cues required  HOME EXERCISE PROGRAM: Access Code: [email or web address] URL: [email or web address] Date: 12/03/2022 Prepared by: Chyrel Masson  Exercises - Supine Heel Slide  - 1-2 x daily - 7 x weekly - 1 sets - 10 reps - 2 hold - Supine Quadricep Sets  - 1-2 x daily - 7 x weekly - 1 sets - 10 reps - 5 hold - Seated Long Arc Quad  - 3-5 x daily - 7 x weekly - 1-2 sets - 10 reps - 2 hold - Seated Quad Set  - 3-5 x daily - 7 x weekly - 1 sets - 10 reps - 5 hold - Seated Straight Leg Heel Taps  - 1-2 x daily - 7 x weekly - 3 sets - 10 reps - Sit to Stand  - 3 x daily - 7 x weekly - 1 sets - 10 reps  ASSESSMENT:  CLINICAL IMPRESSION: Patient able to improve knee motion with functional activities with PT instruction.  Her strength appears to be slowly improving.   OBJECTIVE IMPAIRMENTS: Abnormal gait, decreased activity tolerance, decreased balance, decreased coordination, decreased endurance, decreased mobility, difficulty walking, decreased ROM, decreased strength, increased edema, increased fascial restrictions, impaired perceived functional ability, impaired flexibility, improper body mechanics, and pain.   ACTIVITY LIMITATIONS: carrying, lifting, bending, sitting, standing, squatting, sleeping, stairs, transfers, and locomotion level  PARTICIPATION LIMITATIONS: meal prep, cleaning, interpersonal relationship, driving, shopping, community activity, and occupation  PERSONAL FACTORS:  History of Lt breast cancer, GERD, hyperlipidemia,   are also affecting patient's functional outcome.   REHAB POTENTIAL: Good  CLINICAL DECISION MAKING: Stable/uncomplicated  EVALUATION COMPLEXITY: Low   GOALS: Goals reviewed with patient? Yes  SHORT TERM GOALS: (target date for Short term goals are 3 weeks  12/24/2022)   1.  Patient will demonstrate independent use of home exercise program to maintain progress from in clinic treatments.  Goal  status: Met   LONG TERM GOALS: (target dates for all long term goals are 10 weeks  02/11/2023 )   1. Patient will demonstrate/report pain at worst less than or equal to 2/10 to facilitate minimal limitation in daily activity secondary to pain symptoms.  Goal status: on going 12/31/2022   2. Patient will demonstrate independent use of home exercise program to facilitate ability to maintain/progress functional gains from skilled physical therapy services.  Goal status: on going 12/31/2022   3. Patient will demonstrate FOTO outcome > or = 62 % to indicate reduced disability due to condition.  Goal status: on going 12/31/2022   4.  Patient will demonstrate Rt LE dynamometry testing with 10 % of Lt for knee extension to faciltiate usual transfers, stairs, squatting at PLOF for daily life.   Goal status: on going 12/31/2022   5.  Patient will demonstrate independent ambulation community distances > 500 ft  Goal status: on going 12/31/2022   6.  Patient will demonstrate ascending/descending stairs reciprocally s UE assist for community integration.   Goal status: on going 12/31/2022   7.  Patient will demonstrate return to work at [name].  Goal Status: on going 12/31/2022   PLAN:  PT FREQUENCY: 1-2x/week  PT DURATION: 10 weeks  PLANNED INTERVENTIONS: Therapeutic exercises, Therapeutic activity, Neuro Muscular re-education, Balance training, Gait training, Patient/Family education, Joint mobilization, Stair training, DME instructions, Dry Needling, Electrical stimulation, Traction, Cryotherapy, vasopneumatic deviceMoist heat, Taping, Ultrasound, Ionotophoresis 4mg /ml Dexamethasone, and aquatic therapy, Manual therapy.  All included unless contraindicated  PLAN FOR NEXT SESSION:  check & update HEP.     Vladimir Faster, PT, DPT 01/22/2023, 12:53 PM

## 2023-01-27 ENCOUNTER — Ambulatory Visit (INDEPENDENT_AMBULATORY_CARE_PROVIDER_SITE_OTHER): Payer: 59 | Admitting: Physical Therapy

## 2023-01-27 ENCOUNTER — Encounter: Payer: Self-pay | Admitting: Physical Therapy

## 2023-01-27 DIAGNOSIS — M25561 Pain in right knee: Secondary | ICD-10-CM

## 2023-01-27 DIAGNOSIS — M6281 Muscle weakness (generalized): Secondary | ICD-10-CM

## 2023-01-27 DIAGNOSIS — R600 Localized edema: Secondary | ICD-10-CM

## 2023-01-27 DIAGNOSIS — R262 Difficulty in walking, not elsewhere classified: Secondary | ICD-10-CM

## 2023-01-27 DIAGNOSIS — G8929 Other chronic pain: Secondary | ICD-10-CM | POA: Diagnosis not present

## 2023-01-27 NOTE — Therapy (Signed)
OUTPATIENT PHYSICAL THERAPY TREATMENT NOTE   Patient Name: Mackenzie [name] MRN: [id number] DOB:21-Dec-1970, 52 y.o., female Today's Date: 01/27/2023    END OF SESSION:  PT End of Session - 01/27/23 0851     Visit Number 17    Number of Visits 20    Date for PT Re-Evaluation 02/11/23    Authorization Type [facility] [employer], deductible met    Progress Note Due on Visit 20    PT Start Time 0845    PT Stop Time 0930    PT Time Calculation (min) 45 min    Activity Tolerance Patient tolerated treatment well    Behavior During Therapy [facility] for tasks assessed/performed                  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   Allergic rhinitis 2012   Anemia 01/27/2017   prior to starting chemotherapy   Breast cancer (HCC) 01/14/2017   Left breast   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, left (HCC) 01/23/2017   Edema, lower extremity    GERD (gastroesophageal reflux disease)    Healthcare maintenance 03/25/2017   Hot flashes 03/2017   Hyperlipidemia 03/20/2016   Morbid obesity with body mass index (BMI) of 40.0 to 49.9 (HCC) 09/17/2017   Pre-diabetes 07/26/2018   Hgb A1C elevated on 07/26/2018, Gestational Diabetes 2012   Seasonal allergies 2012   seasonal allergies causes allergic rhinitis and itchy, dry eyes per pt   Vitamin D deficiency 05/2015   Past Surgical History:  Procedure Laterality Date   BREAST RECONSTRUCTION WITH PLACEMENT OF TISSUE EXPANDER AND FLEX HD (ACELLULAR HYDRATED DERMIS) Left 08/27/2017   Procedure: LEFT BREAST RECONSTRUCTION WITH PLACEMENT OF TISSUE EXPANDER AND FLEX HD;  Surgeon: Peggye Form, DO;  Location: [facility];  Service: Plastics;  Laterality: Left;   CESAREAN SECTION     x2   KNEE ARTHROSCOPY WITH MEDIAL MENISECTOMY Right 11/12/2022   Procedure: RIGHT KNEE ARTHROSCOPY, PARTIAL MEDIAL MENISCECTOMY;  Surgeon: Tarry Kos, MD;  Location: [place]  [facility];  Service: Orthopedics;  Laterality: Right;   LATISSIMUS FLAP TO BREAST Left 11/03/2018   LATISSIMUS FLAP TO BREAST Left 11/03/2018   Procedure: LATISSIMUS FLAP TO LEFT BREAST;  Surgeon: Peggye Form, DO;  Location: [facility];  Service: Plastics;  Laterality: Left;   MASTECTOMY MODIFIED RADICAL Left 08/27/2017   MASTECTOMY MODIFIED RADICAL Left 08/27/2017   Procedure: LEFT MODIFIED RADICAL MASTECTOMY;  Surgeon: Harriette Bouillon, MD;  Location: [facility];  Service: General;  Laterality: Left;   MASTOPEXY Right 03/03/2019   Procedure: RIGHT BREAST MASTOPEXY/REDUCTION;  Surgeon: Peggye Form, DO;  Location: [facility];  Service: Plastics;  Laterality: Right;  3 hours, please   PORT-A-CATH REMOVAL Right 08/27/2017   Procedure: REMOVAL PORT-A-CATH RIGHT CHEST;  Surgeon: Harriette Bouillon, MD;  Location: [facility];  Service: General;  Laterality: Right;   PORTACATH PLACEMENT Right 01/28/2017   Procedure: INSERTION PORT-A-CATH WITH ULTRASOUND;  Surgeon: Harriette Bouillon, MD;  Location: [facility];  Service: General;  Laterality: Right;   REDUCTION MAMMAPLASTY Right    REMOVAL OF TISSUE EXPANDER AND PLACEMENT OF IMPLANT Left 03/03/2019   Procedure: REMOVAL OF TISSUE EXPANDER AND PLACEMENT OF EXPANDER;  Surgeon: Peggye Form, DO;  Location: [place] [facility];  Service: Plastics;  Laterality: Left;   REMOVAL OF TISSUE EXPANDER AND PLACEMENT OF IMPLANT Left 05/25/2019  Procedure: LEFT BREAST REMOVAL OF TISSUE EXPANDER AND PLACEMENT OF IMPLANT;  Surgeon: Peggye Form, DO;  Location: [place] [facility];  Service: Plastics;  Laterality: Left;   TISSUE EXPANDER PLACEMENT Left 11/03/2018   Procedure: PLACEMENT OF TISSUE EXPANDER LEFT BREAST;  Surgeon: Peggye Form, DO;  Location: [facility];  Service: Plastics;  Laterality: Left;  Total case time is 3.5 hours   TUBAL LIGATION Bilateral 01/21/2011   Patient Active Problem List   Diagnosis  Date Noted   Acute meniscal tear, medial, right, initial encounter 11/12/2022   Plica syndrome of right knee 11/12/2022   Chondromalacia, right knee 11/12/2022   Depression 08/13/2022   BMI 40.0-44.9, adult (HCC) 08/13/2022   Obesity, Beginning BMI 08/13/2022   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   Boil of buttock, Left 12/24/2021   Hyperthyroidism 07/22/2021   Low serum thyroid stimulating hormone (TSH) 06/12/2021   Other fatigue 05/23/2021   Hemoglobin low 05/23/2021   Polyphagia 05/16/2020   Vitamin D deficiency 05/16/2020   At risk for osteoporosis 05/16/2020   Prediabetes 05/01/2020   Elevated blood pressure reading 05/01/2020   At risk for impaired metabolic function 05/01/2020   Breast asymmetry following reconstructive surgery 09/20/2019   Essential hypertension 08/17/2018   Class 3 severe obesity with serious comorbidity and body mass index (BMI) of 40.0 to 44.9 in adult [facility]) 09/17/2017   Acquired absence of left breast 09/15/2017   Malignant neoplasm of upper-inner quadrant of left breast in female, 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 MD  REFERRING PROVIDER: Cristie Hem, PA-C  REFERRING DIAG: [phone] (ICD-10-CM) - S/P right knee arthroscopy  THERAPY DIAG:  Chronic pain of right knee  Muscle weakness (generalized)  Difficulty in walking, not elsewhere classified  Localized edema  Rationale for Evaluation and Treatment: Rehabilitation  ONSET DATE: 11/12/2022 Surgery  SUBJECTIVE:  SUBJECTIVE STATEMENT: She had some stiffness in her knee after drive to [place].  PERTINENT HISTORY: History of Lt breast cancer, GERD, hyperlipidemia,   PAIN:  NPRS scale:  2/10 more stiffness than pain.   Pain location: Rt knee  Pain description: tightness, nagging Aggravating factors: increased WB activity, static positioning  Relieving factors: OTC medicine  PRECAUTIONS: None  WEIGHT BEARING RESTRICTIONS: No  FALLS:  Has patient fallen in last 6 months? No  LIVING ENVIRONMENT: Lives with: lives with their family Lives in: House/apartment Stairs: bedroom on 1st floor.  Enter house:  2-3 without hand rails.  Has following equipment at home: FWW, [facility]  OCCUPATION: RN - management with desk, on floor at times.    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   MUSCLE LENGTH: 12/03/2022 No specific testing today  POSTURE:  12/03/2022 Unremarkable  PALPATION: 12/03/2022 Anterior inferior knee.   LOWER EXTREMITY ROM:   ROM Right 12/03/2022 Left 12/03/2022 Right 12/08/22 Right 12/29/22 supine Rt 01/06/23 supine Rt 01/20/2023  Hip flexion  Hip extension        Hip abduction        Hip adduction        Hip internal rotation        Hip external rotation        Knee flexion 107 AROM in supine heel slide with pain noted 114 AROM in supine heel slide AAROM in supine 109 deg mild pain  A: 115 deg  A: 115   Knee extension  -4 AROM in seated LAQ 0 AROM in seated LAQ    A: 0 0 deg in AROM LAQ  Ankle dorsiflexion        Ankle plantarflexion        Ankle inversion        Ankle eversion         (Blank rows = not tested)  LOWER EXTREMITY MMT:  MMT Right 12/03/22 Left 12/03/22 Right  12/23/22 Right 12/31/22 RIght 01/22/23  Hip flexion 5/5 5/5     Hip extension       Hip abduction       Hip adduction       Hip  internal rotation       Hip external rotation       Knee flexion       Knee extension 5/5 42, 42.1 lbs  5/5 68, 61.4 lbs 5/5 47.7, 46 lbs 5/5 47.5, 44 lbs 48.3# & 45.4#  Ankle dorsiflexion       Ankle plantarflexion       Ankle inversion       Ankle eversion        (Blank rows = not tested)  LOWER EXTREMITY SPECIAL TESTS:  12/03/2022 No specific testing  FUNCTIONAL TESTS:  12/16/2022: Rt SLS:  19 seconds   12/03/2022 18 inch chair transfer: without hands on 1st try with weight shift to Lt.  Lt SLS: 8 seconds Rt SLS: < 3 seconds  GAIT: 01/14/2023:  Independent ambulation in clinic.  Fatigue with activity reduced stance phase on Rt leg.   12/10/2022:  Able to perform safe independent ambulation in clinic on level surfaces.   12/03/2022 Mod Independent with cane in Lt UE, reduced stance on Rt LE.                                                                                                                                                                         TODAY'S TREATMENT  DATE:   01/27/2023 Ther Ex: Recumbent bike lvl 2 8 mins, seat 6 Knee extension machine double leg up, single leg lowering Rt 3 x 10 10 lbs - additional time for slow control movement focus Calf stretch incline board : x 3 holding 30 sec Sit to stand 10# X 10  Neuro Re-ed Tandem stance on foam 1 min x 1 bilateral without hand touch  SLS with contralateral leg touching corners of black mat x 8 each corner, bilaterally with occasional HHA  Vasopneumatic Device Rt leg in supine elevation 34 deg medium compression  x 10 min  01/22/2023 Ther Ex: Recumbent bike lvl 2 8 mins, seat 6 While pt rode bike, PT demo & verbal cues on changing LE positioning while sitting to decrease "stiffness" with prolonged sitting and intermittent standing. Pt verbalized understanding.  Lateral step down control 4 inch step 2 x 15 bilateral; PT demo &  verbal cues on weight shift so directly over stance LE. Step on over and down WB on Rt leg 4 inch step x 15; PT demo & verbal cues on weight shift so directly over stance LE. Seated SLR 1 lbs 2 x 15 bilaterally  Stairs 11 steps alternating pattern 2 rails for 6 steps and left rail for 5 steps with SBA. PT demo & verbal cues prior to activity.   Neuro Re-ed SLS with contralateral leg touching corners of black mat x 4 each corner and 4 reps on foam mat, bilaterally with occasional touch //bars Fitter rocker square board with 2 pivots fwd/back & right/left light touching focus x 25  in // bars without UE support; progressed to round board with single pivot fwd/back & right/left light touching focus x 10 reps with intermittent touch //bars  Vasopneumatic Device Rt leg in supine elevation 34 deg medium compression  x 10 mins   TREATMENT                                                                            DATE: 01/20/2023 Ther Ex: Recumbent bike lvl 2 8 mins, seat 6 Lateral step down control 4 inch step 2 x 15 bilateral Step on over and down WB on Rt leg 4 inch step x 15  Seated SLR 1 lbs 2 x 15 bilaterally  Supine Rt leg LAQ in 90 deg hip flexion 2 x 15  Neuro Re-ed SLS with contralateral leg touching corners of black mat x 8 each corner, bilaterally with occasional HHA Fitter rocker board fwd/back light touching focus x 25 in // bars   Vasopneumatic Device Rt leg in supine elevation 34 deg medium compression  x 10 mins       PATIENT EDUCATION:  Education details: rationale for interventions Person educated: Patient Education method: Programmer, multimedia, Facilities manager, Verbal cues, and Handouts Education comprehension: verbalized understanding, returned demonstration, and verbal cues required  HOME EXERCISE PROGRAM: Access Code: [email or web address] URL: [email or web address] Date: 12/03/2022 Prepared by: Chyrel Masson  Exercises - Supine Heel Slide  - 1-2 x daily - 7 x weekly -  1 sets - 10 reps - 2 hold - Supine Quadricep Sets  - 1-2 x daily - 7 x weekly - 1 sets - 10 reps - 5 hold - Seated  Long Arc Quad  - 3-5 x daily - 7 x weekly - 1-2 sets - 10 reps - 2 hold - Seated Quad Set  - 3-5 x daily - 7 x weekly - 1 sets - 10 reps - 5 hold - Seated Straight Leg Heel Taps  - 1-2 x daily - 7 x weekly - 3 sets - 10 reps - Sit to Stand  - 3 x daily - 7 x weekly - 1 sets - 10 reps  ASSESSMENT:  CLINICAL IMPRESSION: She had good tolerance to session focusing on strength and balance. She has one more visit scheduled at this time.    OBJECTIVE IMPAIRMENTS: Abnormal gait, decreased activity tolerance, decreased balance, decreased coordination, decreased endurance, decreased mobility, difficulty walking, decreased ROM, decreased strength, increased edema, increased fascial restrictions, impaired perceived functional ability, impaired flexibility, improper body mechanics, and pain.   ACTIVITY LIMITATIONS: carrying, lifting, bending, sitting, standing, squatting, sleeping, stairs, transfers, and locomotion level  PARTICIPATION LIMITATIONS: meal prep, cleaning, interpersonal relationship, driving, shopping, community activity, and occupation  PERSONAL FACTORS:  History of Lt breast cancer, GERD, hyperlipidemia,   are also affecting patient's functional outcome.   REHAB POTENTIAL: Good  CLINICAL DECISION MAKING: Stable/uncomplicated  EVALUATION COMPLEXITY: Low   GOALS: Goals reviewed with patient? Yes  SHORT TERM GOALS: (target date for Short term goals are 3 weeks  12/24/2022)   1.  Patient will demonstrate independent use of home exercise program to maintain progress from in clinic treatments.  Goal status: Met   LONG TERM GOALS: (target dates for all long term goals are 10 weeks  02/11/2023 )   1. Patient will demonstrate/report pain at worst less than or equal to 2/10 to facilitate minimal limitation in daily activity secondary to pain symptoms.  Goal status: on going  12/31/2022   2. Patient will demonstrate independent use of home exercise program to facilitate ability to maintain/progress functional gains from skilled physical therapy services.  Goal status: on going 12/31/2022   3. Patient will demonstrate FOTO outcome > or = 62 % to indicate reduced disability due to condition.  Goal status: on going 12/31/2022   4.  Patient will demonstrate Rt LE dynamometry testing with 10 % of Lt for knee extension to faciltiate usual transfers, stairs, squatting at PLOF for daily life.   Goal status: on going 12/31/2022   5.  Patient will demonstrate independent ambulation community distances > 500 ft  Goal status: on going 12/31/2022   6.  Patient will demonstrate ascending/descending stairs reciprocally s UE assist for community integration.   Goal status: on going 12/31/2022   7.  Patient will demonstrate return to work at [name].  Goal Status: on going 12/31/2022   PLAN:  PT FREQUENCY: 1-2x/week  PT DURATION: 10 weeks  PLANNED INTERVENTIONS: Therapeutic exercises, Therapeutic activity, Neuro Muscular re-education, Balance training, Gait training, Patient/Family education, Joint mobilization, Stair training, DME instructions, Dry Needling, Electrical stimulation, Traction, Cryotherapy, vasopneumatic deviceMoist heat, Taping, Ultrasound, Ionotophoresis 4mg /ml Dexamethasone, and aquatic therapy, Manual therapy.  All included unless contraindicated  PLAN FOR NEXT SESSION:  check & update HEP. reassess to see if she needs more visits.     April Manson, PT, DPT 01/27/2023, 9:17 AM

## 2023-02-03 ENCOUNTER — Encounter: Payer: Self-pay | Admitting: Rehabilitative and Restorative Service Providers"

## 2023-02-03 ENCOUNTER — Ambulatory Visit (INDEPENDENT_AMBULATORY_CARE_PROVIDER_SITE_OTHER): Payer: 59 | Admitting: Rehabilitative and Restorative Service Providers"

## 2023-02-03 ENCOUNTER — Ambulatory Visit
Admission: RE | Admit: 2023-02-03 | Discharge: 2023-02-03 | Disposition: A | Discharge status: Home / Self Care | Payer: 59 | Source: Ambulatory Visit | Attending: Hematology and Oncology | Admitting: Hematology and Oncology

## 2023-02-03 DIAGNOSIS — R262 Difficulty in walking, not elsewhere classified: Secondary | ICD-10-CM

## 2023-02-03 DIAGNOSIS — M8588 Other specified disorders of bone density and structure, other site: Secondary | ICD-10-CM | POA: Diagnosis not present

## 2023-02-03 DIAGNOSIS — M6281 Muscle weakness (generalized): Secondary | ICD-10-CM | POA: Diagnosis not present

## 2023-02-03 DIAGNOSIS — E349 Endocrine disorder, unspecified: Secondary | ICD-10-CM | POA: Diagnosis not present

## 2023-02-03 DIAGNOSIS — G8929 Other chronic pain: Secondary | ICD-10-CM | POA: Diagnosis not present

## 2023-02-03 DIAGNOSIS — C50212 Malignant neoplasm of upper-inner quadrant of left female breast: Secondary | ICD-10-CM

## 2023-02-03 DIAGNOSIS — Z853 Personal history of malignant neoplasm of breast: Secondary | ICD-10-CM | POA: Diagnosis not present

## 2023-02-03 DIAGNOSIS — M25561 Pain in right knee: Secondary | ICD-10-CM

## 2023-02-03 DIAGNOSIS — R600 Localized edema: Secondary | ICD-10-CM | POA: Diagnosis not present

## 2023-02-03 DIAGNOSIS — N958 Other specified menopausal and perimenopausal disorders: Secondary | ICD-10-CM | POA: Diagnosis not present

## 2023-02-03 NOTE — Therapy (Addendum)
OUTPATIENT PHYSICAL THERAPY TREATMENT NOTE /PROGRESS NOTE  / DISCHARGE   Patient Name: Mackenzie [name] MRN: [id number] DOB:Jun 02, 1971, 52 y.o., female Today's Date: 02/03/2023  Progress Note Reporting Period 12/31/2022 to 02/03/2023  See note below for Objective Data and Assessment of Progress/Goals.    END OF SESSION:  PT End of Session - 02/03/23 0917     Visit Number 18    Number of Visits 20    Date for PT Re-Evaluation 02/11/23    Authorization Type [facility] [employer], deductible met    Progress Note Due on Visit 20    PT Start Time 0917    PT Stop Time 1007    PT Time Calculation (min) 50 min    Activity Tolerance Patient tolerated treatment well    Behavior During Therapy [facility] 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, left (HCC) 01/23/2017   Edema, lower extremity    GERD (gastroesophageal reflux disease)    Healthcare maintenance 03/25/2017   Hot flashes 03/2017   Hyperlipidemia 03/20/2016   Morbid obesity with body mass index (BMI) of 40.0 to 49.9 (HCC) 09/17/2017   Pre-diabetes 07/26/2018   Hgb A1C elevated on 07/26/2018, Gestational Diabetes 2012   Seasonal allergies 2012   seasonal allergies causes allergic rhinitis and itchy, dry eyes per pt   Vitamin D deficiency 05/2015   Past Surgical History:  Procedure Laterality Date   BREAST RECONSTRUCTION WITH PLACEMENT OF TISSUE EXPANDER AND FLEX HD (ACELLULAR HYDRATED DERMIS) Left 08/27/2017   Procedure: LEFT BREAST RECONSTRUCTION WITH PLACEMENT OF TISSUE EXPANDER AND FLEX HD;  Surgeon: Peggye Form, DO;  Location: [facility];  Service: Plastics;  Laterality: Left;   CESAREAN SECTION     x2   KNEE ARTHROSCOPY WITH  MEDIAL MENISECTOMY Right 11/12/2022   Procedure: RIGHT KNEE ARTHROSCOPY, PARTIAL MEDIAL MENISCECTOMY;  Surgeon: Tarry Kos, MD;  Location: [facility];  Service: Orthopedics;  Laterality: Right;   LATISSIMUS FLAP TO BREAST Left 11/03/2018   LATISSIMUS FLAP TO BREAST Left 11/03/2018   Procedure: LATISSIMUS FLAP TO LEFT BREAST;  Surgeon: Peggye Form, DO;  Location: [facility];  Service: Plastics;  Laterality: Left;   MASTECTOMY MODIFIED RADICAL Left 08/27/2017   MASTECTOMY MODIFIED RADICAL Left 08/27/2017   Procedure: LEFT MODIFIED RADICAL MASTECTOMY;  Surgeon: Harriette Bouillon, MD;  Location: [facility];  Service: General;  Laterality: Left;   MASTOPEXY Right 03/03/2019   Procedure: RIGHT BREAST MASTOPEXY/REDUCTION;  Surgeon: Peggye Form, DO;  Location: [facility];  Service: Plastics;  Laterality: Right;  3 hours, please   PORT-A-CATH REMOVAL Right 08/27/2017   Procedure: REMOVAL PORT-A-CATH RIGHT CHEST;  Surgeon: Harriette Bouillon, MD;  Location: [facility];  Service: General;  Laterality: Right;   PORTACATH PLACEMENT Right 01/28/2017   Procedure: INSERTION PORT-A-CATH WITH ULTRASOUND;  Surgeon: Harriette Bouillon, MD;  Location: [facility];  Service: General;  Laterality: Right;   REDUCTION MAMMAPLASTY Right    REMOVAL OF TISSUE EXPANDER AND PLACEMENT OF IMPLANT Left 03/03/2019   Procedure: REMOVAL OF TISSUE EXPANDER AND PLACEMENT OF EXPANDER;  Surgeon: Peggye Form, DO;  Location: [facility];  Service: Plastics;  Laterality: Left;   REMOVAL OF TISSUE EXPANDER AND PLACEMENT OF IMPLANT Left 05/25/2019   Procedure: LEFT BREAST REMOVAL OF TISSUE EXPANDER AND PLACEMENT OF IMPLANT;  Surgeon: Peggye Form, DO;  Location: [facility];  Service: Plastics;  Laterality: Left;   TISSUE EXPANDER PLACEMENT Left 11/03/2018   Procedure: PLACEMENT OF TISSUE EXPANDER LEFT BREAST;  Surgeon: Peggye Form, DO;  Location: [facility];   Service: Plastics;  Laterality: Left;  Total case time is 3.5 hours   TUBAL LIGATION Bilateral 01/21/2011   Patient Active Problem List   Diagnosis Date Noted   Acute meniscal tear, medial, right, initial encounter 11/12/2022   Plica syndrome of right knee 11/12/2022   Chondromalacia, right knee 11/12/2022   Depression 08/13/2022   BMI 40.0-44.9, adult (HCC) 08/13/2022   Obesity, Beginning BMI 08/13/2022   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   Boil of buttock, Left 12/24/2021   Hyperthyroidism 07/22/2021   Low serum thyroid stimulating hormone (TSH) 06/12/2021   Other fatigue 05/23/2021   Hemoglobin low 05/23/2021   Polyphagia 05/16/2020   Vitamin D deficiency 05/16/2020   At risk for osteoporosis 05/16/2020   Prediabetes 05/01/2020   Elevated blood pressure reading 05/01/2020   At risk for impaired metabolic function 05/01/2020   Breast asymmetry following reconstructive surgery 09/20/2019   Essential hypertension 08/17/2018   Class 3 severe obesity with serious comorbidity and body mass index (BMI) of 40.0 to 44.9 in adult [facility]) 09/17/2017   Acquired absence of left breast 09/15/2017   Malignant neoplasm of upper-inner quadrant of left breast in female, 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 MD  REFERRING PROVIDER: Cristie Hem, PA-C  REFERRING DIAG: [phone] (ICD-10-CM) - S/P right knee arthroscopy  THERAPY DIAG:  Chronic pain of right knee  Muscle weakness (generalized)  Difficulty in walking, not elsewhere classified  Localized edema  Rationale for Evaluation and Treatment: Rehabilitation  ONSET DATE: 11/12/2022 Surgery  SUBJECTIVE:  SUBJECTIVE STATEMENT: Pt indicated mild symptoms at worst  to this point.  Pt indicated prolonged walking, going down stairs can still be troublesome/harder than usual.  Reported still putting more weight on Lt leg some.  Pt indicated overall improvement to normal at 75% at this time.    PERTINENT HISTORY: History of Lt breast cancer, GERD, hyperlipidemia,   PAIN:  NPRS scale: pain at worst last couple days 2-3/10 Pain location: Rt knee  Pain description: tightness, nagging Aggravating factors: increased WB activity, static positioning  Relieving factors: OTC medicine  PRECAUTIONS: None  WEIGHT BEARING RESTRICTIONS: No  FALLS:  Has patient fallen in last 6 months? No  LIVING ENVIRONMENT: Lives with: lives with their family Lives in: House/apartment Stairs: bedroom on 1st floor.  Enter house:  2-3 without hand rails.  Has following equipment at home: FWW, [facility]  OCCUPATION: RN - management with desk, on floor at times.    PLOF: Independent ,  [employer] leader, exercise walking   PATIENT GOALS: Reduce pain, walk independently   OBJECTIVE: (objective measures completed at initial evaluation unless otherwise dated)   PATIENT SURVEYS:  02/03/2023:  FOTO update:  55 (Pt indicated not feeling worse than a month ago  12/29/22: FOTO 63%  12/03/2022 FOTO  intake: 52   predicted:  62  COGNITION: 12/03/2022 Overall cognitive status: WFL    SENSATION: 12/03/2022 WFL  EDEMA:  12/03/2022 Localized edema.   MUSCLE LENGTH: 12/03/2022 No specific testing today  POSTURE:  12/03/2022 Unremarkable  PALPATION: 12/03/2022 Anterior inferior knee.   LOWER EXTREMITY ROM:   ROM Right 12/03/2022 Left 12/03/2022 Right 12/08/22 Right 12/29/22 supine Rt 01/06/23 supine Rt 01/20/2023 Right 02/03/2023  Hip flexion         Hip extension         Hip abduction         Hip adduction         Hip internal rotation         Hip external rotation         Knee flexion 107 AROM in supine heel slide with pain noted 114 AROM in supine heel slide AAROM in  supine 109 deg mild pain  A: 115 deg  A: 115  AROM in supine heel slide 118  Knee extension  -4 AROM in seated LAQ 0 AROM in seated LAQ    A: 0 0 deg in AROM LAQ 0 deg in AROM LAQ  Ankle dorsiflexion         Ankle plantarflexion         Ankle inversion         Ankle eversion          (Blank rows = not tested)  LOWER EXTREMITY MMT:  MMT Right 12/03/22 Left 12/03/22 Right  12/23/22 Right 12/31/22 RIght 01/22/23 Right 02/03/2023  Hip flexion 5/5 5/5      Hip extension        Hip abduction        Hip adduction        Hip internal rotation        Hip external rotation        Knee flexion        Knee extension 5/5 42, 42.1 lbs  5/5 68, 61.4 lbs 5/5 47.7, 46 lbs 5/5 47.5, 44 lbs 48.3# & 45.4# 5/5 52.9, 50 lbs   Ankle dorsiflexion        Ankle plantarflexion        Ankle inversion        Ankle eversion         (Blank rows = not tested)  LOWER EXTREMITY SPECIAL TESTS:  12/03/2022 No specific testing  FUNCTIONAL TESTS:  12/16/2022: Rt SLS:  19 seconds   12/03/2022 18 inch chair transfer: without hands on 1st try with weight shift to Lt.  Lt SLS: 8 seconds Rt SLS: < 3 seconds  GAIT: 01/14/2023:  Independent ambulation in clinic.  Fatigue with activity reduced stance phase on Rt leg.   12/10/2022:  Able to perform safe independent ambulation in clinic on level surfaces.   12/03/2022 Mod Independent with cane in Lt UE, reduced stance on Rt LE.  TODAY'S TREATMENT                                                                            DATE:  02/03/2023 Ther Ex: Recumbent bike lvl 2 8 mins, seat 6 Knee extension machine double leg up, single leg lowering Rt 3 x 10 10 lbs - additional time for slow control movement focus Lateral step down instruction on bottom step of flight of stairs x 5 WB on Rt leg Lateral step  down 4 inch step box WB on Rt leg slow lowering focus 2 x 10  Supine Rt LAQ in 90 deg hip flexion x 10   Neuro Re-ed SLS with vector reach forward, lateral, back x 8 each, performed bilaterally   TherActivity Flight of stairs up/down reciprocal gait pattern with single handrail assist  Vasopneumatic Device Rt leg in supine elevation 34 deg medium compression  x 10 min  TODAY'S TREATMENT                                                                            DATE:  01/27/2023 Ther Ex: Recumbent bike lvl 2 8 mins, seat 6 Knee extension machine double leg up, single leg lowering Rt 3 x 10 10 lbs - additional time for slow control movement focus Calf stretch incline board : x 3 holding 30 sec Sit to stand 10# X 10  Neuro Re-ed Tandem stance on foam 1 min x 1 bilateral without hand touch  SLS with contralateral leg touching corners of black mat x 8 each corner, bilaterally with occasional HHA  Vasopneumatic Device Rt leg in supine elevation 34 deg medium compression  x 10 min  TODAY'S TREATMENT                                                                            DATE:01/22/2023 Ther Ex: Recumbent bike lvl 2 8 mins, seat 6 While pt rode bike, PT demo & verbal cues on changing LE positioning while sitting to decrease "stiffness" with prolonged sitting and intermittent standing. Pt verbalized understanding.  Lateral step down control 4 inch step 2 x 15 bilateral; PT demo & verbal cues on weight shift so directly over stance LE. Step on over and down WB on Rt leg 4 inch step x 15; PT demo & verbal cues on weight shift so directly over stance LE. Seated SLR 1 lbs 2 x 15 bilaterally  Stairs 11 steps alternating pattern 2 rails for 6 steps and left rail for 5 steps with SBA. PT demo & verbal cues prior to activity.   Neuro Re-ed SLS with contralateral leg touching corners of black mat  x 4 each corner and 4 reps on foam mat, bilaterally with occasional touch //bars Fitter rocker square  board with 2 pivots fwd/back & right/left light touching focus x 25  in // bars without UE support; progressed to round board with single pivot fwd/back & right/left light touching focus x 10 reps with intermittent touch //bars  Vasopneumatic Device Rt leg in supine elevation 34 deg medium compression  x 10 mins    PATIENT EDUCATION:  02/03/2023 Education details: HEP update Person educated: Patient Education method: Programmer, multimedia, Demonstration, Verbal cues, and Handouts Education comprehension: verbalized understanding, returned demonstration, and verbal cues required  HOME EXERCISE PROGRAM: Access Code: [email or web address] URL: [email or web address] Date: 02/03/2023 Prepared by: Chyrel Masson  Exercises - Supine Heel Slide  - 2-3 x daily - 7 x weekly - 1 sets - 10 reps - 2 hold - Seated Long Arc Quad  - 3-5 x daily - 7 x weekly - 1-2 sets - 10 reps - 2 hold - Seated Quad Set  - 2-3 x daily - 7 x weekly - 1 sets - 10 reps - 5 hold - Seated Straight Leg Heel Taps  - 1-2 x daily - 7 x weekly - 2-3 sets - 10-15 reps - Sit to Stand  - 1-2 x daily - 7 x weekly - 2 sets - 10-15 reps - Lateral Step Down  - 1 x daily - 3-4 x weekly - 2-3 sets - 10-15 reps  ASSESSMENT:  CLINICAL IMPRESSION: The patient has attended 18 visits over the course of treatment cycle.  Patient has reported overall improvement at 75%.  See objective data above for updated information regarding current presentation.   Gains continued to be noted in strength and functional WB activity tolerance.  Pt may continue to benefit from progressive strengthening to help improve WB stance control.  Pt has access to gym based activity as well as established HEP at this time.  Recommendation to begin transitioning towards HEP at this time.  Pt was in agreement.   FOTO was down today in reassessment but Pt reiterated improvement to this point was noted.    OBJECTIVE IMPAIRMENTS: Abnormal gait, decreased activity tolerance,  decreased balance, decreased coordination, decreased endurance, decreased mobility, difficulty walking, decreased ROM, decreased strength, increased edema, increased fascial restrictions, impaired perceived functional ability, impaired flexibility, improper body mechanics, and pain.   ACTIVITY LIMITATIONS: carrying, lifting, bending, sitting, standing, squatting, sleeping, stairs, transfers, and locomotion level  PARTICIPATION LIMITATIONS: meal prep, cleaning, interpersonal relationship, driving, shopping, community activity, and occupation  PERSONAL FACTORS:  History of Lt breast cancer, GERD, hyperlipidemia,   are also affecting patient's functional outcome.   REHAB POTENTIAL: Good  CLINICAL DECISION MAKING: Stable/uncomplicated  EVALUATION COMPLEXITY: Low   GOALS: Goals reviewed with patient? Yes  SHORT TERM GOALS: (target date for Short term goals are 3 weeks  12/24/2022)   1.  Patient will demonstrate independent use of home exercise program to maintain progress from in clinic treatments.  Goal status: Met   LONG TERM GOALS: (target dates for all long term goals are 10 weeks  02/11/2023 )   1. Patient will demonstrate/report pain at worst less than or equal to 2/10 to facilitate minimal limitation in daily activity secondary to pain symptoms.  Goal status: mostly met 02/03/2023   2. Patient will demonstrate independent use of home exercise program to facilitate ability to maintain/progress functional gains from skilled physical therapy services.  Goal status: met 02/03/2023   3. Patient will demonstrate FOTO outcome >  or = 62 % to indicate reduced disability due to condition.  Goal status: on going 02/03/2023   4.  Patient will demonstrate Rt LE dynamometry testing with 10 % of Lt for knee extension to faciltiate usual transfers, stairs, squatting at PLOF for daily life.   Goal status: mostly met 02/03/2023   5.  Patient will demonstrate independent ambulation community  distances > 500 ft  Goal status: met 02/03/2023   6.  Patient will demonstrate ascending/descending stairs reciprocally s UE assist for community integration.   Goal status: on going 12/31/2022   7.  Patient will demonstrate return to work at [name].  Goal Status: met 02/03/2023   PLAN:  PT FREQUENCY: 1-2x/week  PT DURATION: 10 weeks  PLANNED INTERVENTIONS: Therapeutic exercises, Therapeutic activity, Neuro Muscular re-education, Balance training, Gait training, Patient/Family education, Joint mobilization, Stair training, DME instructions, Dry Needling, Electrical stimulation, Traction, Cryotherapy, vasopneumatic deviceMoist heat, Taping, Ultrasound, Ionotophoresis 4mg /ml Dexamethasone, and aquatic therapy, Manual therapy.  All included unless contraindicated  PLAN FOR NEXT SESSION: Returning to MD for normal follow up.   Trial HEP period.    Chyrel Masson, PT, DPT, OCS, ATC 02/03/23  10:05 AM    PHYSICAL THERAPY DISCHARGE SUMMARY  Visits from Start of Care: 18  Current functional level related to goals / functional outcomes: See note   Remaining deficits: See note   Education / Equipment: HEP  Patient goals were  mostly met . Patient is being discharged due to not returning since the last visit.  Chyrel Masson, PT, DPT, OCS, ATC 03/13/23  10:39 AM

## 2023-02-04 ENCOUNTER — Other Ambulatory Visit (HOSPITAL_COMMUNITY): Payer: Self-pay

## 2023-02-04 ENCOUNTER — Ambulatory Visit (INDEPENDENT_AMBULATORY_CARE_PROVIDER_SITE_OTHER): Payer: 59 | Admitting: Physician Assistant

## 2023-02-04 DIAGNOSIS — M25561 Pain in right knee: Secondary | ICD-10-CM

## 2023-02-04 DIAGNOSIS — Z9889 Other specified postprocedural states: Secondary | ICD-10-CM | POA: Diagnosis not present

## 2023-02-04 MED ORDER — TRAMADOL HCL 50 MG PO TABS
50.0000 mg | ORAL_TABLET | Freq: Two times a day (BID) | ORAL | 2 refills | Status: DC | PRN
  Filled 2023-02-04: qty 30, 15d supply, fill #0
  Filled 2023-03-16: qty 30, 15d supply, fill #1
  Filled 2023-04-13: qty 30, 15d supply, fill #2

## 2023-02-04 MED ORDER — METHYLPREDNISOLONE ACETATE 40 MG/ML IJ SUSP
40.0000 mg | INTRAMUSCULAR | Status: AC | PRN
Start: 2023-02-04 — End: 2023-02-04
  Administered 2023-02-04: 40 mg via INTRA_ARTICULAR

## 2023-02-04 MED ORDER — LIDOCAINE HCL 1 % IJ SOLN
2.0000 mL | INTRAMUSCULAR | Status: AC | PRN
Start: 2023-02-04 — End: 2023-02-04
  Administered 2023-02-04: 2 mL

## 2023-02-04 MED ORDER — BUPIVACAINE HCL 0.25 % IJ SOLN
2.0000 mL | INTRAMUSCULAR | Status: AC | PRN
Start: 2023-02-04 — End: 2023-02-04
  Administered 2023-02-04: 2 mL via INTRA_ARTICULAR

## 2023-02-04 NOTE — Progress Notes (Signed)
Post-Op Visit Note   Patient: Mackenzie [name]           Date of Birth: Nov 01, 1970           MRN: [id number] Visit Date: 02/04/2023 PCP: Dois Davenport, MD   Assessment & Plan:  Chief Complaint:  Chief Complaint  Patient presents with   Right Knee - Follow-up   Visit Diagnoses:  1. S/P right knee arthroscopy     Plan: Patient is a pleasant 52 year old female who comes in today approximately 3 months status post right knee arthroscopic debridement medial meniscus 11/12/2022.  She had grade 3 changes to the medial and patellofemoral compartments.  She has been doing well in regards to pain which is only minimal but still notes some popping sensations when she twists.  She also has increased stiffness when going from a seated to standing position.  She has not undergone cortisone injections since having her knee scoped.  Examination of the right knee reveals a small effusion.  Range of motion 0 to 125 degrees.  She is neurovascularly intact distally.  At this point, believe her symptoms are coming from her underlying arthritis.  We have discussed knee aspiration and cortisone injection for which she is agreeable to.  She will follow-up with [name] as needed.  She will call with concerns or questions in the meantime.  Procedure Note  Patient: Mackenzie [name]             Date of Birth: [birth year]           MRN: [id number]             Visit Date: 02/04/2023  Procedures: Visit Diagnoses:  1. S/P right knee arthroscopy     Large Joint Inj: R knee on 02/04/2023 8:55 AM Indications: pain Details: 22 G needle, anterolateral approach Medications: 2 mL lidocaine 1 %; 2 mL bupivacaine 0.25 %; 40 mg methylPREDNISolone acetate 40 MG/ML        Follow-Up Instructions: Return if symptoms worsen or fail to improve.   Orders:  Orders Placed This Encounter  Procedures   Large Joint Inj: R knee   No orders of the defined types were placed in this encounter.   Imaging: DG Bone  Density  Result Date: 02/03/2023 EXAM: DUAL X-RAY ABSORPTIOMETRY (DXA) FOR BONE MINERAL DENSITY IMPRESSION: Referring Physician:  Rachel Moulds Your patient completed a bone mineral density test using GE Lunar iDXA system (analysis version: 16). Technologist:    lmn PATIENT: Name: Mackenzie, [name] Patient ID: [id number] Birth Date: 1970-06-22 Height: 65.0 in. Sex: Female Measured: 02/03/2023 Weight: 260.6 lbs. Indications: Breast Cancer History, Estrogen Deficient, Femara, Gabapentin, Hyperthyroid (242.9), Methimazole, Omeprazole, Postmenopausal, Secondary Osteoporosis, Vitamin D Deficient, Wellbutrin Fractures: None Treatments: Calcium (E943.0), Multivitamin, Vitamin D (E933.5) ASSESSMENT: The BMD measured at AP Spine L1-L4 is 0.951 g/cm2 with a T-score of -1.9. This patient is considered osteopenic/low bone mass according to World Health Organization [facility]) criteria. The quality of the exam is limited by patient body habitus. Site Region Measured Date Measured Age YA BMD Significant CHANGE T-score AP Spine L1-L4 02/03/2023 54.0 -1.9 0.951 g/cm2 * AP Spine L1-L4 03/20/2021 52.2 -1.1 1.050 g/cm2 * DualFemur Neck Left 02/03/2023 54.0 -0.9 0.914 g/cm2 * DualFemur Neck Left 03/20/2021 52.2 -0.3 0.990 g/cm2 * DualFemur Total Mean 02/03/2023 54.0 -0.1 0.993 g/cm2 * DualFemur Total Mean 03/20/2021 52.2 0.2 1.030 g/cm2 * World Health Organization [facility]) criteria for post-menopausal, Caucasian Women: Normal       T-score at  or above -1 SD Osteopenia   T-score between -1 and -2.5 SD Osteoporosis T-score at or below -2.5 SD RECOMMENDATION: 1. All patients should optimize calcium and vitamin D intake. 2. Consider FDA-approved medical therapies in postmenopausal women and men aged 11 years and older, based on the following: a. A hip or vertebral (clinical or morphometric) fracture. b. T-score = -2.5 at the femoral neck or spine after appropriate evaluation to exclude secondary causes. c. Low bone mass (T-score between -1.0  and -2.5 at the femoral neck or spine) and a 10-year probability of a hip fracture = 3% or a 10-year probability of a major osteoporosis-related fracture = 20% based on the [place]-adapted WHO algorithm. d. Clinician judgment and/or patient preferences may indicate treatment for people with 10-year fracture probabilities above or below these levels. FOLLOW-UP: Patients with diagnosis of osteoporosis or at high risk for fracture should have regular bone mineral density tests.? Patients eligible for Medicare are allowed routine testing every 2 years.? The testing frequency can be increased to one year for patients who have rapidly progressing disease, are receiving or discontinuing medical therapy to restore bone mass, or have additional risk factors. I have reviewed this study and agree with the findings. [facility] FRAX* 10-year Probability of Fracture Based on femoral neck BMD: DualFemur (Left) Major Osteoporotic Fracture: 2.0% Hip Fracture:                0.1% Population:                  [place] ([place]) Risk Factors:                Secondary Osteoporosis *FRAX is a Armed forces logistics/support/administrative officer of the [employer] of [employer] for Metabolic Bone Disease, a World Science writer (WHO) [employer]. ASSESSMENT: The probability of a major osteoporotic fracture is 2.0% within the next ten years. The probability of a hip fracture is 0.1% within the next ten years. Electronically Signed   By: Annia Belt M.D.   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   Boil of buttock, Left 12/24/2021   Hyperthyroidism 07/22/2021   Low serum thyroid  stimulating hormone (TSH) 06/12/2021   Other fatigue 05/23/2021   Hemoglobin low 05/23/2021   Polyphagia 05/16/2020   Vitamin D deficiency 05/16/2020   At risk for osteoporosis 05/16/2020   Prediabetes 05/01/2020   Elevated blood pressure reading 05/01/2020   At risk for impaired metabolic function 05/01/2020   Breast asymmetry following reconstructive surgery 09/20/2019   Essential hypertension 08/17/2018   Class 3 severe obesity with serious comorbidity and body mass index (BMI) of 40.0 to 44.9 in adult [facility]) 09/17/2017   Acquired absence of left breast 09/15/2017   Malignant neoplasm of upper-inner quadrant of left breast in female, 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   Healthcare maintenance 03/25/2017   Malignant 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, estrogen receptor positive (HCC) 01/23/2017   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, left (HCC) 01/23/2017   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   Allergic  rhinitis 2012   Anemia 01/27/2017   prior to starting chemotherapy   Breast cancer (HCC) 01/14/2017   Left breast   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, left (HCC) 01/23/2017   Edema, lower extremity    GERD (gastroesophageal reflux disease)    Healthcare maintenance 03/25/2017   Hot flashes 03/2017   Hyperlipidemia 03/20/2016   Morbid obesity with body mass index (BMI) of 40.0 to 49.9 (HCC) 09/17/2017   Pre-diabetes 07/26/2018   Hgb A1C elevated on 07/26/2018, Gestational Diabetes 2012   Seasonal allergies 2012   seasonal allergies causes allergic rhinitis and itchy, dry eyes per pt   Vitamin D deficiency 05/2015    Family History  Problem Relation Age of Onset   Other Mother        d.56 from complications of surgery to remove brain tumor   Diabetes Mother    Hypertension Mother    Stroke Mother    Kidney disease Father    Breast cancer  Paternal Grandmother 72       d.60s from breast cancer. Did not have treatment.   Hypertension Other    Diabetes Other    Stroke Other     Past Surgical History:  Procedure Laterality Date   BREAST RECONSTRUCTION WITH PLACEMENT OF TISSUE EXPANDER AND FLEX HD (ACELLULAR HYDRATED DERMIS) Left 08/27/2017   Procedure: LEFT BREAST RECONSTRUCTION WITH PLACEMENT OF TISSUE EXPANDER AND FLEX HD;  Surgeon: Peggye Form, DO;  Location: [facility];  Service: Plastics;  Laterality: Left;   CESAREAN SECTION     x2   KNEE ARTHROSCOPY WITH MEDIAL MENISECTOMY Right 11/12/2022   Procedure: RIGHT KNEE ARTHROSCOPY, PARTIAL MEDIAL MENISCECTOMY;  Surgeon: Tarry Kos, MD;  Location: [facility];  Service: Orthopedics;  Laterality: Right;   LATISSIMUS FLAP TO BREAST Left 11/03/2018   LATISSIMUS FLAP TO BREAST Left 11/03/2018   Procedure: LATISSIMUS FLAP TO LEFT BREAST;  Surgeon: Peggye Form, DO;  Location: [facility];  Service: Plastics;  Laterality: Left;   MASTECTOMY MODIFIED RADICAL Left 08/27/2017   MASTECTOMY MODIFIED RADICAL Left 08/27/2017   Procedure: LEFT MODIFIED RADICAL MASTECTOMY;  Surgeon: Harriette Bouillon, MD;  Location: [facility];  Service: General;  Laterality: Left;   MASTOPEXY Right 03/03/2019   Procedure: RIGHT BREAST MASTOPEXY/REDUCTION;  Surgeon: Peggye Form, DO;  Location: [facility];  Service: Plastics;  Laterality: Right;  3 hours, please   PORT-A-CATH REMOVAL Right 08/27/2017   Procedure: REMOVAL PORT-A-CATH RIGHT CHEST;  Surgeon: Harriette Bouillon, MD;  Location: [facility];  Service: General;  Laterality: Right;   PORTACATH PLACEMENT Right 01/28/2017   Procedure: INSERTION PORT-A-CATH WITH ULTRASOUND;  Surgeon: Harriette Bouillon, MD;  Location: [facility];  Service: General;  Laterality: Right;   REDUCTION MAMMAPLASTY Right    REMOVAL OF TISSUE EXPANDER AND PLACEMENT OF IMPLANT Left 03/03/2019   Procedure: REMOVAL OF TISSUE EXPANDER AND PLACEMENT  OF EXPANDER;  Surgeon: Peggye Form, DO;  Location: [facility];  Service: Plastics;  Laterality: Left;   REMOVAL OF TISSUE EXPANDER AND PLACEMENT OF IMPLANT Left 05/25/2019   Procedure: LEFT BREAST REMOVAL OF TISSUE EXPANDER AND PLACEMENT OF IMPLANT;  Surgeon: Peggye Form, DO;  Location: [facility];  Service: Plastics;  Laterality: Left;   TISSUE EXPANDER PLACEMENT Left 11/03/2018   Procedure: PLACEMENT OF TISSUE EXPANDER LEFT BREAST;  Surgeon: Peggye Form, DO;  Location: [facility];  Service: Plastics;  Laterality: Left;  Total case time  is 3.5 hours   TUBAL LIGATION Bilateral 01/21/2011   Social History   Occupational History   Occupation: Charity fundraiser  Tobacco Use   Smoking status: Never   Smokeless tobacco: Never  Vaping Use   Vaping status: Never Used  Substance and Sexual Activity   Alcohol use: Yes    Comment: social   Drug use: No   Sexual activity: Not on file    Comment: BTL

## 2023-02-11 ENCOUNTER — Other Ambulatory Visit (HOSPITAL_COMMUNITY): Payer: Self-pay

## 2023-02-12 ENCOUNTER — Other Ambulatory Visit (HOSPITAL_COMMUNITY): Payer: Self-pay

## 2023-02-12 MED ORDER — SPIRONOLACTONE 25 MG PO TABS
25.0000 mg | ORAL_TABLET | Freq: Every day | ORAL | 0 refills | Status: DC
  Filled 2023-02-12: qty 90, 90d supply, fill #0

## 2023-02-12 MED ORDER — MONTELUKAST SODIUM 10 MG PO TABS
10.0000 mg | ORAL_TABLET | Freq: Every day | ORAL | 0 refills | Status: DC
  Filled 2023-02-12: qty 90, 90d supply, fill #0

## 2023-02-13 ENCOUNTER — Encounter: Payer: Self-pay | Admitting: *Deleted

## 2023-02-16 ENCOUNTER — Encounter (INDEPENDENT_AMBULATORY_CARE_PROVIDER_SITE_OTHER): Payer: Self-pay | Admitting: Adult Health

## 2023-02-16 ENCOUNTER — Ambulatory Visit (INDEPENDENT_AMBULATORY_CARE_PROVIDER_SITE_OTHER): Payer: 59 | Admitting: Adult Health

## 2023-02-16 VITALS — BP 112/75 | HR 72 | Temp 97.8°F | Ht 65.0 in | Wt 254.0 lb

## 2023-02-16 DIAGNOSIS — E669 Obesity, unspecified: Secondary | ICD-10-CM | POA: Diagnosis not present

## 2023-02-16 DIAGNOSIS — R7303 Prediabetes: Secondary | ICD-10-CM | POA: Diagnosis not present

## 2023-02-16 DIAGNOSIS — Z0000 Encounter for general adult medical examination without abnormal findings: Secondary | ICD-10-CM

## 2023-02-16 DIAGNOSIS — Z6841 Body Mass Index (BMI) 40.0 and over, adult: Secondary | ICD-10-CM | POA: Diagnosis not present

## 2023-02-16 DIAGNOSIS — E559 Vitamin D deficiency, unspecified: Secondary | ICD-10-CM

## 2023-02-16 NOTE — Progress Notes (Signed)
WEIGHT SUMMARY AND BIOMETRICS  Vitals Temp: 97.8 F (36.6 C) BP: 112/75 Pulse Rate: 72 SpO2: 99 %   Anthropometric Measurements Height: 5\' 5"  (1.651 m) Weight: 254 lb (115.2 kg) BMI (Calculated): 42.27 Weight at Last Visit: 251lb Weight Lost Since Last Visit: 0 Weight Gained Since Last Visit: 3lb Starting Weight: 267lb Total Weight Loss (lbs): 13 lb (5.897 kg)   Body Composition  Body Fat %: 48.5 % Fat Mass (lbs): 123.2 lbs Muscle Mass (lbs): 124.2 lbs Total Body Water (lbs): 86 lbs Visceral Fat Rating : 15   Other Clinical Data Fasting: no Labs: no Today's Visit #: 46 Starting Date: 08/09/19    Chief Complaint:   OBESITY Mackenzie [name] is here to discuss her progress with her obesity treatment plan. She is on the the Category 3 Plan and states she is following her eating plan approximately 60 % of the time. She states she is exercising Walking and Gym 60 minutes 3 times per week.   Interim History:  Today- she is starting a 10 day cleanse- smoothies, lean meat, vegetables  Hydration-she has been drinking 5 x 16.9 oz water bottles  Reviewed Bioimpedance results with pt: Muscle Mass: +1.4 lbs Adipose Mass: +1.4 lbs  Subjective:   1. Healthcare maintenance Discussed Labs  Latest Reference Range & Units 01/19/23 09:39  Magnesium 1.6 - 2.3 mg/dL 2.1  Mg++ level stable  2. Pre-diabetes Discussed Labs Lab Results  Component Value Date   HGBA1C 6.0 (H) 01/19/2023   HGBA1C 5.8 (H) 09/03/2022   HGBA1C 5.4 01/29/2022     Latest Reference Range & Units 01/19/23 09:39  Magnesium 1.6 - 2.3 mg/dL 2.1  F6E and Insulin both worsened. Mg++ level is WNL  She is on Metformin 500mg  BID with meals- tolerating well Her insurance will not cover any AOMs   3. Vit D Def Discussed Labs  Latest Reference Range & Units 01/19/23 09:39  Vitamin D, 25-Hydroxy 30.0 - 100.0 ng/mL 76.5  She is on OTC Vit D 3 supplementation- unsure of dosage  Assessment/Plan:   1.  Healthcare maintenance Continue with Cat 3 meal plan and regular walking  2. Pre-diabetes Continue Metformin 500mg  BID with meals   3. Vit D Def Continue current OTC Supplementation- please confirm dosage at next OV  3. Obesity, Current BMI 42.27  Mackenzie [name] is currently in the action stage of change. As such, her goal is to continue with weight loss efforts. She has agreed to the Category 3 Plan.   Exercise goals: For substantial health benefits, adults should do at least 150 minutes (2 hours and 30 minutes) a week of moderate-intensity, or 75 minutes (1 hour and 15 minutes) a week of vigorous-intensity aerobic physical activity, or an equivalent combination of moderate- and vigorous-intensity aerobic activity. Aerobic activity should be performed in episodes of at least 10 minutes, and preferably, it should be spread throughout the week.  Behavioral modification strategies: increasing lean protein intake, decreasing simple carbohydrates, increasing vegetables, increasing water intake, no skipping meals, meal planning and cooking strategies, and planning for success.  Mackenzie [name] has agreed to follow-up with our clinic in 4 weeks. She was informed of the importance of frequent follow-up visits to maximize her success with intensive lifestyle modifications for her multiple health conditions.   Objective:   Blood pressure 112/75, pulse 72, temperature 97.8 F (36.6 C), height 5\' 5"  (1.651 m), weight 254 lb (115.2 kg), last menstrual period 11/28/2016, SpO2 99%, unknown if currently breastfeeding. Body mass index is  42.27 kg/m.  General: Cooperative, alert, well developed, in no acute distress. HEENT: Conjunctivae and lids unremarkable. Cardiovascular: Regular rhythm.  Lungs: Normal work of breathing. Neurologic: No focal deficits.   Lab Results  Component Value Date   CREATININE 0.80 01/05/2023   BUN 13 01/05/2023   NA 136 01/05/2023   K 4.2 01/05/2023   CL 104 01/05/2023   CO2 28  01/05/2023   Lab Results  Component Value Date   ALT 16 01/05/2023   AST 16 01/05/2023   GGT 19 03/01/2021   ALKPHOS 107 01/05/2023   BILITOT 0.3 01/05/2023   Lab Results  Component Value Date   HGBA1C 6.0 (H) 01/19/2023   HGBA1C 5.8 (H) 09/03/2022   HGBA1C 5.4 01/29/2022   HGBA1C 5.4 12/19/2021   HGBA1C 5.4 05/23/2021   Lab Results  Component Value Date   INSULIN 15.4 01/19/2023   INSULIN 13.1 09/03/2022   INSULIN 21.3 01/29/2022   INSULIN 18.2 01/07/2022   INSULIN 15.1 05/23/2021   Lab Results  Component Value Date   TSH 3.81 01/13/2023   Lab Results  Component Value Date   CHOL 202 (H) 11/15/2020   HDL 58 11/15/2020   LDLCALC 135 (H) 11/15/2020   TRIG 52 11/15/2020   CHOLHDL 3.5 11/15/2020   Lab Results  Component Value Date   VD25OH 76.5 01/19/2023   VD25OH 81.2 09/03/2022   VD25OH 50.2 01/29/2022   Lab Results  Component Value Date   WBC 4.8 01/05/2023   HGB 12.4 01/05/2023   HCT 37.9 01/05/2023   MCV 86.1 01/05/2023   PLT 205 01/05/2023   No results found for: "IRON", "TIBC", "FERRITIN"  Attestation Statements:   Reviewed by clinician on day of visit: allergies, medications, problem list, medical history, surgical history, family history, social history, and previous encounter notes.  I have reviewed the above documentation for accuracy and completeness, and I agree with the above. -  Xaiver Roskelley d. Donavon Kimrey, NP-C

## 2023-02-17 DIAGNOSIS — H43391 Other vitreous opacities, right eye: Secondary | ICD-10-CM | POA: Diagnosis not present

## 2023-02-17 DIAGNOSIS — H43822 Vitreomacular adhesion, left eye: Secondary | ICD-10-CM | POA: Diagnosis not present

## 2023-02-17 DIAGNOSIS — H43811 Vitreous degeneration, right eye: Secondary | ICD-10-CM | POA: Diagnosis not present

## 2023-02-19 ENCOUNTER — Ambulatory Visit: Payer: 59 | Admitting: Hematology and Oncology

## 2023-02-19 ENCOUNTER — Other Ambulatory Visit: Payer: 59

## 2023-02-27 ENCOUNTER — Other Ambulatory Visit (HOSPITAL_COMMUNITY): Payer: Self-pay

## 2023-02-28 MED ORDER — ROSUVASTATIN CALCIUM 10 MG PO TABS
10.0000 mg | ORAL_TABLET | Freq: Every day | ORAL | 0 refills | Status: DC
  Filled 2023-02-28: qty 90, 90d supply, fill #0

## 2023-03-02 ENCOUNTER — Other Ambulatory Visit (HOSPITAL_COMMUNITY): Payer: Self-pay

## 2023-03-05 ENCOUNTER — Other Ambulatory Visit (HOSPITAL_COMMUNITY): Payer: Self-pay

## 2023-03-16 ENCOUNTER — Encounter (INDEPENDENT_AMBULATORY_CARE_PROVIDER_SITE_OTHER): Payer: Self-pay | Admitting: Adult Health

## 2023-03-16 ENCOUNTER — Ambulatory Visit (INDEPENDENT_AMBULATORY_CARE_PROVIDER_SITE_OTHER): Payer: 59 | Admitting: Adult Health

## 2023-03-16 VITALS — BP 108/73 | HR 67 | Temp 97.7°F | Ht 65.0 in | Wt 253.0 lb

## 2023-03-16 DIAGNOSIS — Z9889 Other specified postprocedural states: Secondary | ICD-10-CM | POA: Diagnosis not present

## 2023-03-16 DIAGNOSIS — E669 Obesity, unspecified: Secondary | ICD-10-CM

## 2023-03-16 DIAGNOSIS — R7303 Prediabetes: Secondary | ICD-10-CM

## 2023-03-16 DIAGNOSIS — E559 Vitamin D deficiency, unspecified: Secondary | ICD-10-CM | POA: Diagnosis not present

## 2023-03-16 DIAGNOSIS — Z6841 Body Mass Index (BMI) 40.0 and over, adult: Secondary | ICD-10-CM

## 2023-03-16 NOTE — Progress Notes (Signed)
WEIGHT SUMMARY AND BIOMETRICS  Vitals Temp: 97.7 F (36.5 C) BP: 108/73 Pulse Rate: 67 SpO2: 97 %   Anthropometric Measurements Height: 5\' 5"  (1.651 m) Weight: 253 lb (114.8 kg) BMI (Calculated): 42.1 Weight at Last Visit: 254lb Weight Lost Since Last Visit: 1lb Weight Gained Since Last Visit: 0 Starting Weight: 267lb Total Weight Loss (lbs): 14 lb (6.35 kg)   Body Composition  Body Fat %: 49 % Fat Mass (lbs): 124 lbs Muscle Mass (lbs): 122.4 lbs Total Body Water (lbs): 88 lbs Visceral Fat Rating : 16   Other Clinical Data Fasting: yes Labs: no Today's Visit #: 21 Starting Date: 08/09/19    Chief Complaint:   OBESITY Yani is here to discuss her progress with her obesity treatment plan. She is on the the Category 3 Plan and states she is following her eating plan approximately 60-70 % of the time. She states she is exercising Walking. Bike, Water Aerobics 15-20, 20-25, 50 minutes 2/2/1 times per week.   Interim History:  She has been working and driving without restriction. She reports R knee stable, however endorses  new onset of R ankle/Achilles pain in the last several weeks. She denies acute injury/accident prior to onset of pain.  Of Note- Last dose of Wegovy 2.4 mg once weekly injection early 2024.   Subjective:   1. S/P right knee arthroscopy 11/12/2022 RIGHT KNEE ARTHROSCOPY, PARTIAL MEDIAL MENISCECTOMY  She has completed course of Out Patient PT She has returned to work and driving without restrictions  She reports new onset of R ankle/Achilles pain in the last several weeks. She denies acute injury/accident prior to onset of pain.  2. Vitamin D deficiency Discussed Labs  Latest Reference Range & Units 09/03/22 15:43 01/19/23 09:39  Vitamin D, 25-Hydroxy 30.0 - 100.0 ng/mL 81.2 76.5   She is on: Daily OTC Multivitamin Daily OTC Vit D/Calcium supplement- unsure of dosage Daily OTC Vit D3- unsure of dosage  3.  Pre-diabetes Discussed Labs Lab Results  Component Value Date   HGBA1C 6.0 (H) 01/19/2023   HGBA1C 5.8 (H) 09/03/2022   HGBA1C 5.4 01/29/2022    She is on Metformin 500mg  BID with meals- tolerating well Her insurance will not cover any AOMs   Last dose of Wegovy 2.4 mg once weekly injection early 2024.   A1c worsened from last check.  Per pt- both her maternal grandmother and mother developed diabetes in late 50s/early 33s  Assessment/Plan:   1. S/P right knee arthroscopy F/u with Orthopedic Surgeon  2. Vitamin D deficiency Please verify OTC supplement dosages and provide information at next OV  3. Pre-diabetes Continue to increase protein and regular exercise Continue Metformin therapy as directed  4. Obesity, Current BMI 42.1  Shaquanta is currently in the action stage of change. As such, her goal is to continue with weight loss efforts. She has agreed to the Category 3 Plan.   Exercise goals: For substantial health benefits, adults should do at least 150 minutes (2 hours and 30 minutes) a week of moderate-intensity, or 75 minutes (1 hour and 15 minutes) a week of vigorous-intensity aerobic physical activity, or an equivalent combination of moderate- and vigorous-intensity aerobic activity. Aerobic activity should be performed in episodes of at least 10 minutes, and preferably, it should be spread throughout the week.  Behavioral modification strategies: increasing lean protein intake, decreasing simple carbohydrates, increasing vegetables, increasing water intake, no skipping meals, meal planning and cooking strategies, and planning for success.  Noora has  agreed to follow-up with our clinic in 4 weeks. She was informed of the importance of frequent follow-up visits to maximize her success with intensive lifestyle modifications for her multiple health conditions.   Attestation Statements:   Reviewed by clinician on day of visit: allergies, medications, problem list,  medical history, surgical history, family history, social history, and previous encounter notes.  I have reviewed the above documentation for accuracy and completeness, and I agree with the above. -  Cashmere Dingley d. Vannie Hilgert, NP-C

## 2023-03-17 ENCOUNTER — Ambulatory Visit: Payer: 59 | Attending: Student

## 2023-03-17 ENCOUNTER — Other Ambulatory Visit: Payer: Self-pay

## 2023-03-17 VITALS — Wt 259.0 lb

## 2023-03-17 DIAGNOSIS — Z483 Aftercare following surgery for neoplasm: Secondary | ICD-10-CM | POA: Insufficient documentation

## 2023-03-17 NOTE — Therapy (Signed)
OUTPATIENT PHYSICAL THERAPY SOZO SCREENING NOTE   Patient Name: Mackenzie [name] MRN: [id number] DOB:09/12/1970, 52 y.o., female Today's Date: 03/17/2023  PCP: Dois Davenport, MD REFERRING PROVIDER: Laurena Spies, PA-C   PT End of Session - 03/17/23 [phone]     Visit Number 18   # unchanged due to screen only   PT Start Time 0903    PT Stop Time 0920    PT Time Calculation (min) 17 min    Activity Tolerance Patient tolerated treatment well    Behavior During Therapy [facility] for tasks assessed/performed             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   Allergic rhinitis 2012   Anemia 01/27/2017   prior to starting chemotherapy   Breast cancer (HCC) 01/14/2017   Left breast   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, left (HCC) 01/23/2017   Edema, lower extremity    GERD (gastroesophageal reflux disease)    Healthcare maintenance 03/25/2017   Hot flashes 03/2017   Hyperlipidemia 03/20/2016   Morbid obesity with body mass index (BMI) of 40.0 to 49.9 (HCC) 09/17/2017   Pre-diabetes 07/26/2018   Hgb A1C elevated on 07/26/2018, Gestational Diabetes 2012   Seasonal allergies 2012   seasonal allergies causes allergic rhinitis and itchy, dry eyes per pt   Vitamin D deficiency 05/2015   Past Surgical History:  Procedure Laterality Date   BREAST RECONSTRUCTION WITH PLACEMENT OF TISSUE EXPANDER AND FLEX HD (ACELLULAR HYDRATED DERMIS) Left 08/27/2017   Procedure: LEFT BREAST RECONSTRUCTION WITH PLACEMENT OF TISSUE EXPANDER AND FLEX HD;  Surgeon: Peggye Form, DO;  Location: [facility];  Service: Plastics;  Laterality: Left;   CESAREAN SECTION     x2   KNEE ARTHROSCOPY WITH MEDIAL MENISECTOMY Right 11/12/2022   Procedure: RIGHT KNEE ARTHROSCOPY, PARTIAL MEDIAL MENISCECTOMY;  Surgeon: Tarry Kos, MD;  Location: [facility];  Service: Orthopedics;  Laterality: Right;    LATISSIMUS FLAP TO BREAST Left 11/03/2018   LATISSIMUS FLAP TO BREAST Left 11/03/2018   Procedure: LATISSIMUS FLAP TO LEFT BREAST;  Surgeon: Peggye Form, DO;  Location: [facility];  Service: Plastics;  Laterality: Left;   MASTECTOMY MODIFIED RADICAL Left 08/27/2017   MASTECTOMY MODIFIED RADICAL Left 08/27/2017   Procedure: LEFT MODIFIED RADICAL MASTECTOMY;  Surgeon: Harriette Bouillon, MD;  Location: [facility];  Service: General;  Laterality: Left;   MASTOPEXY Right 03/03/2019   Procedure: RIGHT BREAST MASTOPEXY/REDUCTION;  Surgeon: Peggye Form, DO;  Location: [facility];  Service: Plastics;  Laterality: Right;  3 hours, please   PORT-A-CATH REMOVAL Right 08/27/2017   Procedure: REMOVAL PORT-A-CATH RIGHT CHEST;  Surgeon: Harriette Bouillon, MD;  Location: [facility];  Service: General;  Laterality: Right;   PORTACATH PLACEMENT Right 01/28/2017   Procedure: INSERTION PORT-A-CATH WITH ULTRASOUND;  Surgeon: Harriette Bouillon, MD;  Location: [facility];  Service: General;  Laterality: Right;   REDUCTION MAMMAPLASTY Right    REMOVAL OF TISSUE EXPANDER AND PLACEMENT OF IMPLANT Left 03/03/2019   Procedure: REMOVAL OF TISSUE EXPANDER AND PLACEMENT OF EXPANDER;  Surgeon: Peggye Form, DO;  Location: [facility];  Service: Plastics;  Laterality: Left;   REMOVAL OF TISSUE EXPANDER AND PLACEMENT OF IMPLANT Left 05/25/2019   Procedure: LEFT BREAST REMOVAL OF TISSUE EXPANDER AND PLACEMENT OF IMPLANT;  Surgeon: Peggye Form, DO;  Location: [facility];  Service: Government social research officer;  Laterality: Left;   TISSUE EXPANDER PLACEMENT Left 11/03/2018   Procedure: PLACEMENT OF TISSUE EXPANDER LEFT BREAST;  Surgeon: Peggye Form, DO;  Location: [facility];  Service: Plastics;  Laterality: Left;  Total case time is 3.5 hours   TUBAL LIGATION Bilateral 01/21/2011   Patient Active Problem List   Diagnosis Date Noted   Acute meniscal tear, medial, right, initial  encounter 11/12/2022   Plica syndrome of right knee 11/12/2022   Chondromalacia, right knee 11/12/2022   Depression 08/13/2022   BMI 40.0-44.9, adult (HCC) 08/13/2022   Obesity, Beginning BMI 08/13/2022   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   Boil of buttock, Left 12/24/2021   Hyperthyroidism 07/22/2021   Low serum thyroid stimulating hormone (TSH) 06/12/2021   Other fatigue 05/23/2021   Hemoglobin low 05/23/2021   Polyphagia 05/16/2020   Vitamin D deficiency 05/16/2020   At risk for osteoporosis 05/16/2020   Prediabetes 05/01/2020   Elevated blood pressure reading 05/01/2020   At risk for impaired metabolic function 05/01/2020   Breast asymmetry following reconstructive surgery 09/20/2019   Essential hypertension 08/17/2018   Class 3 severe obesity with serious comorbidity and body mass index (BMI) of 40.0 to 44.9 in adult [facility]) 09/17/2017   Acquired absence of left breast 09/15/2017   Malignant neoplasm of upper-inner quadrant of left breast in female, 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   Healthcare maintenance 03/25/2017   Malignant 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, estrogen receptor positive (HCC) 01/23/2017   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 Then had neo-adjuvant chemo starting 02/10/17 until 06/26/17; had left mastectomy 08/27/17 with ALND and immediate expander placed. Radiation completed. Expander removed and replaced on May 27, 19 b/c it did not get expanded enough prior to radiation, also had lat flap 11/03/18. Had expander removed and replaced again on 03/03/19 due to a small leak, pt had reduction on R breast at this time   PRECAUTIONS: left UE Lymphedema  risk, None  SUBJECTIVE: Pt returns for her 3 month L-Dex screen. "I had a medial menisectomy 11/12/22 and PT for a few weeks. It was doing really good but I increased my time on the bike last week and it's been bothering me this week."   PAIN:  Are you having pain? No and Yes: NPRS scale: 3-4/10 Pain location: knee (Lt breast/upper quadrant does not hurt) Pain description: stiff Aggravating factors: maybe increased recumbent bike time Relieving factors: muscle relaxer  SOZO SCREENING: Patient was assessed today using the SOZO machine to determine the lymphedema index score. This was compared to her baseline score. It was determined that she is within the recommended range when compared to her baseline and no further action is needed at this time. She will continue SOZO screenings. These are done every 3 months for 2 years post operatively followed by every 6 months for 2 years, and then annually.  Encouraged pt to resume her stretches from PT as she reports she has not been doing these. Briefly reviewed Rt hip flexor, HS and calf stretch that she had been doing and encouraged her to incorporate these into her day frequently. Reminded her that 20-30 sec hold is more important than reps. (i.e. if she only has short  time, hold stretch longer, don't worry about doing more of them) Pt able to verbalize good understanding.    L-DEX FLOWSHEETS - 03/17/23 0900       L-DEX LYMPHEDEMA SCREENING   Measurement Type Unilateral    L-DEX MEASUREMENT EXTREMITY Upper Extremity    POSITION  Standing    DOMINANT SIDE Right    At Risk Side Left    BASELINE SCORE (UNILATERAL) 5.3    L-DEX SCORE (UNILATERAL) 6.7    VALUE CHANGE (UNILAT) 1.4               Hermenia Bers, PTA 03/17/2023, 9:26 AM

## 2023-03-23 ENCOUNTER — Ambulatory Visit: Payer: 59

## 2023-03-27 ENCOUNTER — Telehealth: Payer: Self-pay | Admitting: *Deleted

## 2023-03-27 NOTE — Telephone Encounter (Signed)
[name] Visit 26: Spoke with patient on the phone to complete study visit for the [facility] trial.  SAEs: Patient denies any serious medical problems or hospital admissions since last study visit.  HCRU:  She did not have any unscheduled visits in our clinic for any issues related to her breast cancer or treatment since her last study visit.  Endocrine Treatment: Patient states she continues to take letrozole as prescribed and will record in her diary.  She understands she can stop recording the doses after 05/31/23 but continue to take per Dr. Al Pimple.  Plan: Next study visit with Dr. Al Pimple for End of Treatment Visit is scheduled for 06/09/23. Patient confirmed.  Encouraged patient to contact research nurse for any questions or concerns prior to next visit. Thanked patient for her time today. She verbalized understanding.  [name] Reamer, BSN, RN, [employer] Clinical Research Nurse II [phone] 03/27/2023 2:04 PM

## 2023-03-30 ENCOUNTER — Encounter (HOSPITAL_BASED_OUTPATIENT_CLINIC_OR_DEPARTMENT_OTHER): Payer: Self-pay | Admitting: Cardiovascular Disease

## 2023-03-30 ENCOUNTER — Ambulatory Visit (INDEPENDENT_AMBULATORY_CARE_PROVIDER_SITE_OTHER): Payer: 59 | Admitting: Cardiovascular Disease

## 2023-03-30 VITALS — BP 120/72 | HR 73 | Ht 65.0 in | Wt 265.9 lb

## 2023-03-30 DIAGNOSIS — I739 Peripheral vascular disease, unspecified: Secondary | ICD-10-CM

## 2023-03-30 DIAGNOSIS — E7800 Pure hypercholesterolemia, unspecified: Secondary | ICD-10-CM | POA: Diagnosis not present

## 2023-03-30 DIAGNOSIS — I10 Essential (primary) hypertension: Secondary | ICD-10-CM | POA: Diagnosis not present

## 2023-03-30 DIAGNOSIS — Z6841 Body Mass Index (BMI) 40.0 and over, adult: Secondary | ICD-10-CM | POA: Diagnosis not present

## 2023-03-30 DIAGNOSIS — E66813 Obesity, class 3: Secondary | ICD-10-CM | POA: Diagnosis not present

## 2023-03-30 HISTORY — DX: Pure hypercholesterolemia, unspecified: E78.00

## 2023-03-30 NOTE — Patient Instructions (Signed)
Medication Instructions:  Your physician recommends that you continue on your current medications as directed. Please refer to the Current Medication list given to you today.   *If you need a refill on your cardiac medications before your next appointment, please call your pharmacy*  Lab Work: FASTING LP/CMETLPa/APB/HSCRP SOON   If you have labs (blood work) drawn today and your tests are completely normal, you will receive your results only by: MyChart Message (if you have MyChart) OR A paper copy in the mail If you have any lab test that is abnormal or we need to change your treatment, we will call you to review the results.  Testing/Procedures: Your physician has requested that you have an ankle brachial index (ABI). During this test an ultrasound and blood pressure cuff are used to evaluate the arteries that supply the arms and legs with blood. Allow thirty minutes for this exam. There are no restrictions or special instructions.  Follow-Up: At [facility], you and your health needs are our priority.  As part of our continuing mission to provide you with exceptional heart care, we have created designated Provider Care Teams.  These Care Teams include your primary Cardiologist (physician) and Advanced Practice Providers (APPs -  Physician Assistants and Nurse Practitioners) who all work together to provide you with the care you need, when you need it.  We recommend signing up for the patient portal called "MyChart".  Sign up information is provided on this After Visit Summary.  MyChart is used to connect with patients for Virtual Visits (Telemedicine).  Patients are able to view lab/test results, encounter notes, upcoming appointments, etc.  Non-urgent messages can be sent to your provider as well.   To learn more about what you can do with MyChart, go to [email or web address].    Your next appointment:   12 month(s)  Provider:   Chilton Si, MD or Gillian Shields, NP     Other Instructions  Dr. Dorothyann Peng Dr. Abbe [place]

## 2023-03-30 NOTE — Progress Notes (Deleted)
Cardiology Office Note:  .   Date:  03/30/2023 ID:  Mackenzie [name], DOB Oct 17, 1970, MRN [id number] PCP: Mackenzie Davenport, MD [facility] Providers Cardiologist:  None { Click to update primary MD,subspecialty MD or APP then REFRESH:1}   Patient Profile: .      ***       History of Present Illness: .   Mackenzie [name] is a 52 y.o. female with hypertension, hyperlipidemia, and morbid obesity and ER positive breast cancer status postchemotherapy who is here today for new patient consult for cardiovascular prevention.  She has been working with the healthy weight and wellness clinic and has lost 14 pounds.  She has a history of ER positive breast cancer diagnosed in 2018.  She underwent left modified radical neck mastectomy and chemotherapy with goserelin, letrozole,and ribociclib.  Family history: Her family history includes Breast cancer (age of onset: 17) in her paternal grandmother; Diabetes in her mother and another family member; Hypertension in her mother and another family member; Kidney disease in her father; Other in her mother; Stroke in her mother and another family member.   ASCVD Risk Score: The 10-year ASCVD risk score (Arnett DK, et al., 2019) is: 5.6%   Values used to calculate the score:     Age: 52 years     Sex: Female     Is Non-Hispanic African American: Yes     Diabetic: Yes     Tobacco smoker: No     Systolic Blood Pressure: 108 mmHg     Is BP treated: Yes     HDL Cholesterol: 58 mg/dL     Total Cholesterol: 202 mg/dL  {MD Calc ASCVD Calculator :1}  Diet:  Activity:  ROS: ***       Studies Reviewed: .          *** Risk Assessment/Calculations:   {Does this patient have ATRIAL FIBRILLATION?:[phone]} No BP recorded.  {Refresh Note OR Click here to enter BP  :1}***       Physical Exam:   VS: LMP 11/28/2016   Wt Readings from Last 3 Encounters:  03/17/23 259 lb (117.5 kg)  03/16/23 253 lb (114.8 kg)  02/16/23 254 lb (115.2 kg)      GEN: Well nourished, well developed in no acute distress NECK: No JVD; No carotid bruits CARDIAC: ***RRR, no murmurs, rubs, gallops RESPIRATORY:  Clear to auscultation without rales, wheezing or rhonchi  ABDOMEN: Soft, non-tender, non-distended EXTREMITIES:  No edema; No deformity     ASSESSMENT AND PLAN: .    CV Risk Assessment  Plan/Goals:  Activity: ***      {Are you ordering a CV Procedure (e.g. stress test, cath, DCCV, TEE, etc)?   Press F2        :[id number]}  Dispo: ***  Signed, Eligha Bridegroom, NP-C

## 2023-03-30 NOTE — Progress Notes (Signed)
Cardiology Office Note:  .    Date:  03/30/2023  ID:  Mackenzie [name], DOB 1970-08-31, MRN [id number] PCP: Dois Davenport, MD  [facility] Providers Cardiologist:  Armanda Magic, MD     History of Present Illness: .    Mackenzie [name] is a 52 y.o. female with hypertension, hyperlipidemia, morbid obesity, and ER positive breast cancer status post chemotherapy, who is here today for new patient consult for cardiovascular prevention.  She has been working with the healthy weight and wellness clinic and has lost 14 pounds.   She has a history of ER positive breast cancer diagnosed in 2018.  She underwent left modified radical neck mastectomy and chemotherapy with goserelin, letrozole,and ribociclib.  Today, she expresses concerns about possibly having cardio metabolic syndrome. She has been on antihypertensives for about a year now, and was recently started on rosuvastatin 10 mg. Lately she has not been formally exercising consistently; she will go to the gym or walk every now and then. When she does exercise or climb inclines, she feels fatigued and out of breath. At times she has pain in her lower legs during increased activity. This is described as a tingling sensation, more dull than sharp. She also has mild LE edema that improves with elevation, mostly localized to her knee (prior knee surgery). In the office her blood pressure is 120/72. When she monitors at work her readings average 120s-130s/80s. She affirms that she is prediabetic. Most of the time she prepares meals at home, and occasionally orders out. She is trying to focus on more protein (chicken, salmon, rare steak) and vegetables. She denies any palpitations, chest pain, lightheadedness, headaches, syncope, orthopnea, or PND.  ROS:  Please see the history of present illness. All other systems are reviewed and negative.  (+) Bilateral lower leg pain (+) Exertional fatigue (+) Shortness of breath (+) Intermittent LE  edema  Studies Reviewed: .        CT Cardiac Scoring  07/16/2022: FINDINGS: Non-cardiac: No significant non cardiac findings on limited lung and soft tissue windows. See separate report from [facility].   Ascending Aorta: Upper limits of normal 3.7 cm   Pericardium: Normal   Coronary arteries: No calcium noted   IMPRESSION: Coronary calcium score of 0.  Risk Assessment/Calculations:             Physical Exam:    VS:  BP 120/72 (BP Location: Right Arm, Patient Position: Sitting, Cuff Size: Large)   Pulse 73   Ht 5\' 5"  (1.651 m)   Wt 265 lb 14.4 oz (120.6 kg)   LMP 11/28/2016   SpO2 96%   BMI 44.25 kg/m  , BMI Body mass index is 44.25 kg/m. GENERAL:  Well appearing HEENT: Pupils equal round and reactive, fundi not visualized, oral mucosa unremarkable NECK:  No jugular venous distention, waveform within normal limits, carotid upstroke brisk and symmetric, no bruits, no thyromegaly LUNGS:  Clear to auscultation bilaterally HEART:  RRR.  PMI not displaced or sustained,S1 and S2 within normal limits, no S3, no S4, no clicks, no rubs, no murmurs ABD:  Flat, positive bowel sounds normal in frequency in pitch, no bruits, no rebound, no guarding, no midline pulsatile mass, no hepatomegaly, no splenomegaly EXT:  Diminished TP pulses bilaterally, 2+ DP, radial and carotid pulses. No edema, no cyanosis no clubbing SKIN:  No rashes no nodules NEURO:  Cranial nerves II through XII grossly intact, motor grossly intact throughout PSYCH:  Cognitively intact, oriented to  person place and time  Wt Readings from Last 3 Encounters:  03/30/23 265 lb 14.4 oz (120.6 kg)  03/17/23 259 lb (117.5 kg)  03/16/23 253 lb (114.8 kg)     ASSESSMENT AND PLAN: .    # Cardiometabolic Syndrome: Patient is fairly concerned about symptoms and signs of cardiometabolic syndrome. History of abdominal obesity, hypertension, hyperlipidemia, prediabetes, and breast cancer. Currently on metoprolol for  palpitations due to thyroid issues. No current known heart issues. -Continue rosuvastatin and check fasting lipids/CMP. -Order LP(a), CRP, and ApoB to assess inflammation and additional CV risk. -Encourage continued efforts in weight loss and diet management.  Continue to limit carbohydrate intake. -Recommend increasing physical activity to at least 150 minutes weekly. BP is controlled on losartan and metoprolol.   # Lower Leg Pain Reports of pain in lower legs, described as a tingling or dull sensation, particularly when on feet a lot. No significant swelling.  Poor TP pulses on exam. -Order Ankle-Brachial Index (ABI) to assess blood flow to legs.  General Health Maintenance -Plan to follow up in a year. -Consider referral to Dr. Ovidio [name] or Dr. Abbe [name] for primary care. -Consider GLP-1 agonists (Ozempic, Sylva, Thorp) for weight loss if insurance coverage changes.      Dispo:  FU with Algernon Mundie C. Duke Salvia, MD, [facility] in 1 year.  I,Mathew Stumpf,acting as a Neurosurgeon for Chilton Si, MD.,have documented all relevant documentation on the behalf of Chilton Si, MD,as directed by  Chilton Si, MD while in the presence of Chilton Si, MD.  I, Janie Capp C. Duke Salvia, MD have reviewed all documentation for this visit.  The documentation of the exam, diagnosis, procedures, and orders on 03/30/2023 are all accurate and complete.   Signed, Chilton Si, MD

## 2023-04-04 LAB — LIPOPROTEIN A (LPA): Lipoprotein (a): 440.9 nmol/L — ABNORMAL HIGH (ref <75.0)

## 2023-04-04 LAB — LIPID PANEL
Chol/HDL Ratio: 3 ratio (ref 0.0–4.4)
Cholesterol, Total: 151 mg/dL (ref 100–199)
HDL: 51 mg/dL (ref >39)
LDL Chol Calc (NIH): 90 mg/dL (ref 0–99)
Triglycerides: 43 mg/dL (ref 0–149)
VLDL Cholesterol Cal: 10 mg/dL (ref 5–40)

## 2023-04-04 LAB — COMPREHENSIVE METABOLIC PANEL
ALT: 19 [IU]/L (ref 0–32)
AST: 26 [IU]/L (ref 0–40)
Albumin: 4.2 g/dL (ref 3.8–4.9)
Alkaline Phosphatase: 133 [IU]/L — ABNORMAL HIGH (ref 44–121)
BUN/Creatinine Ratio: 20 (ref 9–23)
BUN: 20 mg/dL (ref 6–24)
Bilirubin Total: 0.3 mg/dL (ref 0.0–1.2)
CO2: 22 mmol/L (ref 20–29)
Calcium: 9.7 mg/dL (ref 8.7–10.2)
Chloride: 99 mmol/L (ref 96–106)
Creatinine, Ser: 0.99 mg/dL (ref 0.57–1.00)
Globulin, Total: 2.9 g/dL (ref 1.5–4.5)
Glucose: 92 mg/dL (ref 70–99)
Potassium: 4.9 mmol/L (ref 3.5–5.2)
Sodium: 135 mmol/L (ref 134–144)
Total Protein: 7.1 g/dL (ref 6.0–8.5)
eGFR: 68 mL/min/{1.73_m2} (ref >59)

## 2023-04-04 LAB — APOLIPOPROTEIN B: Apolipoprotein B: 84 mg/dL (ref <90)

## 2023-04-04 LAB — HIGH SENSITIVITY CRP: CRP, High Sensitivity: 2.5 mg/L (ref 0.00–3.00)

## 2023-04-07 ENCOUNTER — Other Ambulatory Visit (HOSPITAL_COMMUNITY): Payer: Self-pay

## 2023-04-07 ENCOUNTER — Other Ambulatory Visit: Payer: Self-pay

## 2023-04-07 ENCOUNTER — Other Ambulatory Visit: Payer: Self-pay | Admitting: Hematology and Oncology

## 2023-04-07 MED ORDER — OMEPRAZOLE 40 MG PO CPDR
40.0000 mg | DELAYED_RELEASE_CAPSULE | Freq: Every day | ORAL | 1 refills | Status: DC
  Filled 2023-04-07: qty 90, 90d supply, fill #0
  Filled 2023-07-03: qty 90, 90d supply, fill #1

## 2023-04-08 ENCOUNTER — Other Ambulatory Visit (HOSPITAL_COMMUNITY): Payer: Self-pay

## 2023-04-08 MED ORDER — GABAPENTIN 300 MG PO CAPS
300.0000 mg | ORAL_CAPSULE | Freq: Two times a day (BID) | ORAL | 0 refills | Status: DC
  Filled 2023-04-08: qty 180, 90d supply, fill #0

## 2023-04-08 MED ORDER — INFLUENZA VIRUS VACC SPLIT PF (FLUZONE) 0.5 ML IM SUSY
0.5000 mL | PREFILLED_SYRINGE | Freq: Once | INTRAMUSCULAR | 0 refills | Status: AC
  Filled 2023-04-08: qty 0.5, 1d supply, fill #0

## 2023-04-08 MED ORDER — LOSARTAN POTASSIUM 50 MG PO TABS
50.0000 mg | ORAL_TABLET | Freq: Every day | ORAL | 0 refills | Status: DC
  Filled 2023-04-08: qty 90, 90d supply, fill #0

## 2023-04-13 ENCOUNTER — Encounter (INDEPENDENT_AMBULATORY_CARE_PROVIDER_SITE_OTHER): Payer: Self-pay | Admitting: Adult Health

## 2023-04-13 ENCOUNTER — Other Ambulatory Visit: Payer: Self-pay

## 2023-04-13 ENCOUNTER — Other Ambulatory Visit (HOSPITAL_COMMUNITY): Payer: Self-pay

## 2023-04-13 ENCOUNTER — Ambulatory Visit (INDEPENDENT_AMBULATORY_CARE_PROVIDER_SITE_OTHER): Payer: 59 | Admitting: Adult Health

## 2023-04-13 VITALS — BP 125/74 | HR 85 | Temp 97.9°F | Ht 65.0 in | Wt 253.0 lb

## 2023-04-13 DIAGNOSIS — E559 Vitamin D deficiency, unspecified: Secondary | ICD-10-CM | POA: Diagnosis not present

## 2023-04-13 DIAGNOSIS — E669 Obesity, unspecified: Secondary | ICD-10-CM

## 2023-04-13 DIAGNOSIS — M79662 Pain in left lower leg: Secondary | ICD-10-CM | POA: Diagnosis not present

## 2023-04-13 DIAGNOSIS — F3289 Other specified depressive episodes: Secondary | ICD-10-CM | POA: Diagnosis not present

## 2023-04-13 DIAGNOSIS — M79661 Pain in right lower leg: Secondary | ICD-10-CM

## 2023-04-13 DIAGNOSIS — R7303 Prediabetes: Secondary | ICD-10-CM | POA: Diagnosis not present

## 2023-04-13 DIAGNOSIS — Z6841 Body Mass Index (BMI) 40.0 and over, adult: Secondary | ICD-10-CM | POA: Diagnosis not present

## 2023-04-13 MED ORDER — METFORMIN HCL 500 MG PO TABS
500.0000 mg | ORAL_TABLET | Freq: Two times a day (BID) | ORAL | 0 refills | Status: DC
  Filled 2023-04-13: qty 180, 90d supply, fill #0

## 2023-04-13 MED ORDER — BUPROPION HCL ER (SR) 150 MG PO TB12
150.0000 mg | ORAL_TABLET | Freq: Every day | ORAL | 0 refills | Status: DC
  Filled 2023-04-13: qty 90, 90d supply, fill #0

## 2023-04-13 NOTE — Progress Notes (Signed)
WEIGHT SUMMARY AND BIOMETRICS  Vitals Temp: 97.9 F (36.6 C) BP: 125/74 Pulse Rate: 85 SpO2: 96 %   Anthropometric Measurements Height: 5\' 5"  (1.651 m) Weight: 253 lb (114.8 kg) BMI (Calculated): 42.1 Weight at Last Visit: 253lb Weight Lost Since Last Visit: 0 Weight Gained Since Last Visit: 0 Starting Weight: 267lb Total Weight Loss (lbs): 14 lb (6.35 kg)   Body Composition  Body Fat %: 47.9 % Fat Mass (lbs): 121.2 lbs Muscle Mass (lbs): 125.4 lbs Total Body Water (lbs): 84.8 lbs Visceral Fat Rating : 15   Other Clinical Data Fasting: yes Labs: no Today's Visit #: 63 Starting Date: 08/09/19    Chief Complaint:   OBESITY Mackenzie [name] is here to discuss her progress with her obesity treatment plan. She is on the the Category 3 Plan and states she is following her eating plan approximately 65 % of the time.  She states she is exercising Walking/Strength Training/Stationary Bike 25-30 minutes 2 times per week.   Interim History:  Mackenzie [name] is able to drive, exercise, and work, however continues to experience R knee instability and bilateral pain in lower extremities.  Her Cardiologist recently ordered ABI to work up bil lower ext pain and poor TP upon exam last week.  Subjective:   1. Pain in both lower legs She denies sx's claudication. She reports pain in bil lower extremities- knees and ankles- when she first stands and begins walking. Activity will decrease pain and stiffness. She also endorses intermittent tingling or dull sensation, specifically when ambulating.  03/30/2023 OV with Cards/Dr. Duke Salvia:  Poor TP pulses on exam.   2. Vitamin D deficiency She is on Calcium-Phosphorus-Vitamin D (VITAMIN D3/CALCIUM/PHOSPHORUS PO)  She endorses stable energy levels  6. Pre-diabetes Lab Results  Component Value Date   HGBA1C 6.0 (H) 01/19/2023   HGBA1C 5.8 (H) 09/03/2022   HGBA1C 5.4 01/29/2022   She is on Metformin 500mg  BID with meals She denies  GI upset Her insurance will not cover AOMs (Wegovy, Zepbound)  3. Emotional Eating Behavior BP/HR stable at OV She reports stable mood, denies SI/HI She is on daily Wellbutrin SR 150mg   Assessment/Plan:   1. Pain in both lower legs 03/30/2023 Assessment from Cards OV on 03/30/2023- # Lower Leg Pain Reports of pain in lower legs, described as a tingling or dull sensation, particularly when on feet a lot. No significant swelling.  Poor TP pulses on exam. -Order Ankle-Brachial Index (ABI) to assess blood flow to legs.  2. Vitamin D deficiency Continue  Calcium-Phosphorus-Vitamin D (VITAMIN D3/CALCIUM/PHOSPHORUS PO)   3. Pre-diabetes Refill - metFORMIN (GLUCOPHAGE) 500 MG tablet; Take 1 tablet (500 mg total) by mouth 2 (two) times daily with a meal.  Dispense: 180 tablet; Refill: 0  4. Emotional Eating Behavior Refill - buPROPion (WELLBUTRIN SR) 150 MG 12 hr tablet; Take 1 tablet (150 mg total) by mouth daily.  Dispense: 90 tablet; Refill: 0  5. Obesity, Current BMI 42.1  Mackenzie [name] is currently in the action stage of change. As such, her goal is to continue with weight loss efforts. She has agreed to the Category 3 Plan.   Exercise goals: For substantial health benefits, adults should do at least 150 minutes (2 hours and 30 minutes) a week of moderate-intensity, or 75 minutes (1 hour and 15 minutes) a week of vigorous-intensity aerobic physical activity, or an equivalent combination of moderate- and vigorous-intensity aerobic activity. Aerobic activity should be performed in episodes of at least 10 minutes, and preferably,  it should be spread throughout the week.  Behavioral modification strategies: increasing lean protein intake, decreasing simple carbohydrates, increasing vegetables, increasing water intake, no skipping meals, meal planning and cooking strategies, keeping healthy foods in the home, and planning for success.  Mackenzie [name] has agreed to follow-up with our clinic in 4 weeks.  She was informed of the importance of frequent follow-up visits to maximize her success with intensive lifestyle modifications for her multiple health conditions.   Objective:   Blood pressure 125/74, pulse 85, temperature 97.9 F (36.6 C), height 5\' 5"  (1.651 m), weight 253 lb (114.8 kg), last menstrual period 11/28/2016, SpO2 96%, unknown if currently breastfeeding. Body mass index is 42.1 kg/m.  General: Cooperative, alert, well developed, in no acute distress. HEENT: Conjunctivae and lids unremarkable. Cardiovascular: Regular rhythm.  Lungs: Normal work of breathing. Neurologic: No focal deficits.   Lab Results  Component Value Date   CREATININE 0.99 03/31/2023   BUN 20 03/31/2023   NA 135 03/31/2023   K 4.9 03/31/2023   CL 99 03/31/2023   CO2 22 03/31/2023   Lab Results  Component Value Date   ALT 19 03/31/2023   AST 26 03/31/2023   GGT 19 03/01/2021   ALKPHOS 133 (H) 03/31/2023   BILITOT 0.3 03/31/2023   Lab Results  Component Value Date   HGBA1C 6.0 (H) 01/19/2023   HGBA1C 5.8 (H) 09/03/2022   HGBA1C 5.4 01/29/2022   HGBA1C 5.4 12/19/2021   HGBA1C 5.4 05/23/2021   Lab Results  Component Value Date   INSULIN 15.4 01/19/2023   INSULIN 13.1 09/03/2022   INSULIN 21.3 01/29/2022   INSULIN 18.2 01/07/2022   INSULIN 15.1 05/23/2021   Lab Results  Component Value Date   TSH 3.81 01/13/2023   Lab Results  Component Value Date   CHOL 151 03/31/2023   HDL 51 03/31/2023   LDLCALC 90 03/31/2023   TRIG 43 03/31/2023   CHOLHDL 3.0 03/31/2023   Lab Results  Component Value Date   VD25OH 76.5 01/19/2023   VD25OH 81.2 09/03/2022   VD25OH 50.2 01/29/2022   Lab Results  Component Value Date   WBC 4.8 01/05/2023   HGB 12.4 01/05/2023   HCT 37.9 01/05/2023   MCV 86.1 01/05/2023   PLT 205 01/05/2023   No results found for: "IRON", "TIBC", "FERRITIN"   Attestation Statements:   Reviewed by clinician on day of visit: allergies, medications, problem list,  medical history, surgical history, family history, social history, and previous encounter notes.  I have reviewed the above documentation for accuracy and completeness, and I agree with the above. -  Quinetta Shilling d. Braylin Formby, NP-C

## 2023-04-14 ENCOUNTER — Other Ambulatory Visit (HOSPITAL_COMMUNITY): Payer: Self-pay

## 2023-04-14 ENCOUNTER — Ambulatory Visit (HOSPITAL_BASED_OUTPATIENT_CLINIC_OR_DEPARTMENT_OTHER): Payer: 59

## 2023-04-14 DIAGNOSIS — I739 Peripheral vascular disease, unspecified: Secondary | ICD-10-CM | POA: Diagnosis not present

## 2023-04-15 LAB — VAS US ABI WITH/WO TBI
Left ABI: 1.23
Right ABI: 1.22

## 2023-04-21 ENCOUNTER — Other Ambulatory Visit (INDEPENDENT_AMBULATORY_CARE_PROVIDER_SITE_OTHER): Payer: Self-pay

## 2023-04-21 ENCOUNTER — Ambulatory Visit (INDEPENDENT_AMBULATORY_CARE_PROVIDER_SITE_OTHER): Payer: 59 | Admitting: Physician Assistant

## 2023-04-21 ENCOUNTER — Telehealth: Payer: Self-pay

## 2023-04-21 ENCOUNTER — Other Ambulatory Visit (HOSPITAL_COMMUNITY): Payer: Self-pay

## 2023-04-21 ENCOUNTER — Encounter: Payer: Self-pay | Admitting: Physician Assistant

## 2023-04-21 DIAGNOSIS — M25571 Pain in right ankle and joints of right foot: Secondary | ICD-10-CM | POA: Diagnosis not present

## 2023-04-21 MED ORDER — DICLOFENAC SODIUM 75 MG PO TBEC
75.0000 mg | DELAYED_RELEASE_TABLET | Freq: Two times a day (BID) | ORAL | 0 refills | Status: DC | PRN
  Filled 2023-04-21: qty 60, 30d supply, fill #0

## 2023-04-21 NOTE — Telephone Encounter (Signed)
VOB submitted for Monovisc, right knee  

## 2023-04-21 NOTE — Progress Notes (Signed)
Office Visit Note   Patient: Mackenzie [name]           Date of Birth: Mar 24, 1971           MRN: [id number] Visit Date: 04/21/2023              Requested by: Dois Davenport, MD [address] [place],  [place] [address] PCP: Dois Davenport, MD   Assessment & Plan: Visit Diagnoses:  1. Pain in right ankle and joints of right foot     Plan: Impression is right knee osteoarthritis and right heel insertional Achilles tendinitis.  In regards to the right knee, cortisone injections have only provided temporary relief.  We have discussed getting approval for viscosupplementation injection for which she would like to proceed.  Will call her once approved.  In regards to the Achilles tendinitis, she feels as though her symptoms are bad enough to warrant a cam boot.  Will place her in this and she will wean out as tolerated.  I have also recommended oral and topical NSAIDs.  If her symptoms do not improve over the next several weeks she will let us know.  Call with concerns or questions.  Follow-Up Instructions: Return for f/u once approved for right knee visco injection.   Orders:  Orders Placed This Encounter  Procedures   XR Ankle 2 Views Right   No orders of the defined types were placed in this encounter.     Procedures: No procedures performed   Clinical Data: No additional findings.   Subjective: Chief Complaint  Patient presents with   Right Knee - Pain   Right Ankle - Pain    HPI patient is a pleasant 52 year old female who comes in today with recurrent right knee pain as well as new onset right shoulder pain.  Regards to her knee, she has pain to the anterior medial aspect.  Symptoms are worse with standing or sitting for a period of time.  She notes occasional giving way sensations.  She has been doing tramadol without significant relief.  She is status post right knee arthroscopy with debridement medial meniscus and chondroplasty in June of this past  year.  It was noted that she had grade 3 changes to the medial and patellofemoral compartments.  She was seen in our office at the end of August where cortisone injection was performed.  This only helped temporarily.  She has not undergone viscosupplementation injection since undergoing knee arthroscopy.  In regards to her right heel, the pain has been ongoing for 3 weeks.  She denies any injury or change in activity.  Her pain is to the distal Achilles.  Symptoms are worse with walking.  Pain medication does not help her pain.  Review of Systems as detailed in HPI.  All others reviewed and are negative.   Objective: Vital Signs: LMP 11/28/2016   Physical Exam well-developed well-nourished female in no acute distress.  Alert and oriented x 3.  Ortho Exam right knee exam: Small effusion.  Range of motion 0 to 115 degrees.  Medial and lateral joint line tenderness.  Mild patellofemoral crepitus.  She is neurovascularly intact distally.  Right heel exam: Tenderness along the distal Achilles tendon.  Negative Thompson test.  She is neurovascularly intact distally.  Specialty Comments:  No specialty comments available.  Imaging: No results found.   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   Boil of buttock, Left 12/24/2021   Hyperthyroidism 07/22/2021   Low serum thyroid stimulating hormone (TSH) 06/12/2021   Other fatigue 05/23/2021   Hemoglobin low 05/23/2021   Polyphagia 05/16/2020   Vitamin D deficiency 05/16/2020   At risk for osteoporosis 05/16/2020   Prediabetes 05/01/2020   Elevated blood pressure reading 05/01/2020    At risk for impaired metabolic function 05/01/2020   Breast asymmetry following reconstructive surgery 09/20/2019   Essential hypertension 08/17/2018   Class 3 severe obesity with serious comorbidity and body mass index (BMI) of 40.0 to 44.9 in adult [facility]) 09/17/2017   Acquired absence of left breast 09/15/2017   Malignant neoplasm of upper-inner quadrant of left breast in female, 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   Healthcare maintenance 03/25/2017   Malignant 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, estrogen receptor positive (HCC) 01/23/2017   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, left (HCC) 01/23/2017   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, left (HCC) 01/23/2017   Cellulitis of right leg    Edema, lower extremity    GERD (gastroesophageal reflux disease)    Healthcare maintenance 03/25/2017   Herpesviral infection    Hot flashes 03/2017   Hyperlipidemia 03/20/2016   Impaired fasting glucose    Morbid obesity with body mass index (BMI) of 40.0 to 49.9 (HCC) 09/17/2017   Neoplasm of cervix    Osteoarthritis of knee    Other chest pain    Other specified disorders of veins    Polyneuropathy    Postmenopausal atrophic vaginitis    Pre-diabetes 07/26/2018   Hgb A1C elevated on 07/26/2018, Gestational Diabetes 2012   Pure hypercholesterolemia 03/30/2023   Seasonal allergies 2012   seasonal allergies causes allergic rhinitis and itchy, dry eyes per pt   Stress incontinence    Tachycardia    Thyrotoxicosis    Vitamin D deficiency 05/2015    Family History  Problem Relation Age of Onset   Other Mother        d.56 from complications of surgery to remove  brain tumor   Diabetes Mother    Hypertension Mother    Stroke Mother    Kidney disease Father    Hypertension Maternal Grandmother    Breast cancer Paternal Grandmother 20       d.60s from breast cancer. Did not have treatment.   Hypertension Other    Diabetes Other    Stroke Other     Past Surgical History:  Procedure Laterality Date   BREAST RECONSTRUCTION WITH PLACEMENT OF TISSUE EXPANDER AND FLEX HD (ACELLULAR HYDRATED DERMIS) Left 08/27/2017   Procedure: LEFT BREAST RECONSTRUCTION WITH PLACEMENT OF TISSUE EXPANDER AND FLEX HD;  Surgeon: Peggye Form, DO;  Location: [facility];  Service: Plastics;  Laterality: Left;   CESAREAN SECTION     x2   KNEE ARTHROSCOPY WITH MEDIAL MENISECTOMY Right 11/12/2022   Procedure: RIGHT KNEE  ARTHROSCOPY, PARTIAL MEDIAL MENISCECTOMY;  Surgeon: Tarry Kos, MD;  Location: [facility];  Service: Orthopedics;  Laterality: Right;   LATISSIMUS FLAP TO BREAST Left 11/03/2018   LATISSIMUS FLAP TO BREAST Left 11/03/2018   Procedure: LATISSIMUS FLAP TO LEFT BREAST;  Surgeon: Peggye Form, DO;  Location: [facility] OR;  Service: Plastics;  Laterality: Left;   MASTECTOMY MODIFIED RADICAL Left 08/27/2017   MASTECTOMY MODIFIED RADICAL Left 08/27/2017   Procedure: LEFT MODIFIED RADICAL MASTECTOMY;  Surgeon: Harriette Bouillon, MD;  Location: [facility] OR;  Service: General;  Laterality: Left;   MASTOPEXY Right 03/03/2019   Procedure: RIGHT BREAST MASTOPEXY/REDUCTION;  Surgeon: Peggye Form, DO;  Location: [facility];  Service: Plastics;  Laterality: Right;  3 hours, please   PORT-A-CATH REMOVAL Right 08/27/2017   Procedure: REMOVAL PORT-A-CATH RIGHT CHEST;  Surgeon: Harriette Bouillon, MD;  Location: [facility];  Service: General;  Laterality: Right;   PORTACATH PLACEMENT Right 01/28/2017   Procedure: INSERTION PORT-A-CATH WITH ULTRASOUND;  Surgeon: Harriette Bouillon, MD;  Location: [facility];  Service: General;  Laterality: Right;    REDUCTION MAMMAPLASTY Right    REMOVAL OF TISSUE EXPANDER AND PLACEMENT OF IMPLANT Left 03/03/2019   Procedure: REMOVAL OF TISSUE EXPANDER AND PLACEMENT OF EXPANDER;  Surgeon: Peggye Form, DO;  Location: [facility];  Service: Plastics;  Laterality: Left;   REMOVAL OF TISSUE EXPANDER AND PLACEMENT OF IMPLANT Left 05/25/2019   Procedure: LEFT BREAST REMOVAL OF TISSUE EXPANDER AND PLACEMENT OF IMPLANT;  Surgeon: Peggye Form, DO;  Location:  SURGERY CENTER;  Service: Plastics;  Laterality: Left;   TISSUE EXPANDER PLACEMENT Left 11/03/2018   Procedure: PLACEMENT OF TISSUE EXPANDER LEFT BREAST;  Surgeon: Peggye Form, DO;  Location: [facility];  Service: Plastics;  Laterality: Left;  Total case time is 3.5 hours   TUBAL LIGATION Bilateral 01/21/2011   Social History   Occupational History   Occupation: Charity fundraiser  Tobacco Use   Smoking status: Never   Smokeless tobacco: Never  Vaping Use   Vaping status: Never Used  Substance and Sexual Activity   Alcohol use: Yes    Comment: social   Drug use: No   Sexual activity: Not on file    Comment: BTL

## 2023-04-21 NOTE — Telephone Encounter (Signed)
Please submit for Right knee gel injection.

## 2023-05-04 ENCOUNTER — Telehealth: Payer: Self-pay | Admitting: Orthopaedic Surgery

## 2023-05-04 ENCOUNTER — Other Ambulatory Visit: Payer: Self-pay | Admitting: Orthopaedic Surgery

## 2023-05-04 ENCOUNTER — Other Ambulatory Visit (HOSPITAL_COMMUNITY): Payer: Self-pay

## 2023-05-04 MED ORDER — TRAMADOL HCL 50 MG PO TABS
50.0000 mg | ORAL_TABLET | Freq: Two times a day (BID) | ORAL | 2 refills | Status: DC | PRN
  Filled 2023-05-04: qty 10, 5d supply, fill #0
  Filled 2023-05-12: qty 10, 5d supply, fill #1
  Filled 2023-05-29: qty 10, 5d supply, fill #2

## 2023-05-04 NOTE — Addendum Note (Signed)
Addended by: Mayra Reel on: 05/04/2023 06:11 PM   Modules accepted: Orders

## 2023-05-04 NOTE — Telephone Encounter (Signed)
Yes to visco.  I'll refill tramadol

## 2023-05-04 NOTE — Telephone Encounter (Signed)
Patient called and wants a refill on her medication Tramadol. Also, she wanted to know if she was approved for the GEL shot. CB#[phone]

## 2023-05-05 ENCOUNTER — Other Ambulatory Visit (HOSPITAL_COMMUNITY): Payer: Self-pay

## 2023-05-05 NOTE — Telephone Encounter (Signed)
Tried calling patient concerning gel injection, but no answer and not able to leave a message due to VM being full. PA is still pending for Monovisc, right knee.  Once approved, will call patient to schedule.

## 2023-05-11 ENCOUNTER — Encounter (INDEPENDENT_AMBULATORY_CARE_PROVIDER_SITE_OTHER): Payer: Self-pay | Admitting: Adult Health

## 2023-05-11 ENCOUNTER — Other Ambulatory Visit (HOSPITAL_COMMUNITY): Payer: Self-pay

## 2023-05-11 ENCOUNTER — Ambulatory Visit (INDEPENDENT_AMBULATORY_CARE_PROVIDER_SITE_OTHER): Payer: 59 | Admitting: Adult Health

## 2023-05-11 VITALS — BP 138/74 | HR 89 | Temp 97.8°F | Ht 65.0 in | Wt 259.0 lb

## 2023-05-11 DIAGNOSIS — Z9889 Other specified postprocedural states: Secondary | ICD-10-CM

## 2023-05-11 DIAGNOSIS — Z6841 Body Mass Index (BMI) 40.0 and over, adult: Secondary | ICD-10-CM | POA: Diagnosis not present

## 2023-05-11 DIAGNOSIS — E559 Vitamin D deficiency, unspecified: Secondary | ICD-10-CM

## 2023-05-11 DIAGNOSIS — E669 Obesity, unspecified: Secondary | ICD-10-CM

## 2023-05-11 DIAGNOSIS — F3289 Other specified depressive episodes: Secondary | ICD-10-CM

## 2023-05-11 DIAGNOSIS — R7303 Prediabetes: Secondary | ICD-10-CM

## 2023-05-11 MED ORDER — BUPROPION HCL ER (SR) 200 MG PO TB12
200.0000 mg | ORAL_TABLET | Freq: Every day | ORAL | 0 refills | Status: DC
  Filled 2023-05-11: qty 30, 30d supply, fill #0

## 2023-05-11 MED ORDER — METFORMIN HCL 500 MG PO TABS
500.0000 mg | ORAL_TABLET | Freq: Two times a day (BID) | ORAL | 0 refills | Status: DC
  Filled 2023-05-11 – 2023-05-12 (×2): qty 180, 90d supply, fill #0

## 2023-05-11 NOTE — Progress Notes (Signed)
WEIGHT SUMMARY AND BIOMETRICS  Vitals Temp: 97.8 F (36.6 C) BP: 138/74 Pulse Rate: 89 SpO2: 96 %   Anthropometric Measurements Height: 5\' 5"  (1.651 m) Weight: 259 lb (117.5 kg) BMI (Calculated): 43.1 Weight at Last Visit: 253lb Weight Lost Since Last Visit: 0 Weight Gained Since Last Visit: 6lb Starting Weight: 267lb Total Weight Loss (lbs): 8 lb (3.629 kg)   Body Composition  Body Fat %: 49 % Fat Mass (lbs): 127.2 lbs Muscle Mass (lbs): 125.6 lbs Total Body Water (lbs): 85.4 lbs Visceral Fat Rating : 16   Other Clinical Data Fasting: yes Labs: no Today's Visit #: 78 Starting Date: 08/09/19    Chief Complaint:   OBESITY Mackenzie [name] is here to discuss her progress with her obesity treatment plan. She is on the the Category 3 Plan and states she is following her eating plan approximately 70 % of the time.  She states she is exercising- NEAT Activities R knee and R ankle pain- preventing activity/exercise   Interim History:  3/2024[facility] therapy stopped and she as was started on daily Wellbutrin SR 150mg  She denies hx of seizure d/o Post menopausal since 2018  She is agreeable to increase Wellbutrin therapy slightly from 150mg  to 200mg  daily  She reports worsening R knee and R ankle tendonitis pain R knee- constant pain rated at 8/10, described as sharp pain located over anterior medial R knee R ankle- constant pain rated 7/10, described as dull ache located over R heel  Subjective:   1. S/P right knee arthroscopy 04/21/2023 [facility] OV Notes Assessment & Plan: Visit Diagnoses:  1. Pain in right ankle and joints of right foot       Plan: Impression is right knee osteoarthritis and right heel insertional Achilles tendinitis.  In regards to the right knee, cortisone injections have only provided temporary relief.  We have discussed getting approval for viscosupplementation injection for which she would like to proceed.  Will call her once  approved.  In regards to the Achilles tendinitis, she feels as though her symptoms are bad enough to warrant a cam boot.  Will place her in this and she will wean out as tolerated.  I have also recommended oral and topical NSAIDs.  If her symptoms do not improve over the next several weeks she will let us know.  Call with concerns or questions.   Follow-Up Instructions: Return for f/u once approved for right knee visco injection.   2. Vitamin D deficiency  Latest Reference Range & Units 01/19/23 09:39  Vitamin D, 25-Hydroxy 30.0 - 100.0 ng/mL 76.5   Level therapeutic  She is on daily Multiple Vitamin (MULTIVITAMIN WITH MINERALS) TABS tablet   3. Pre-Diabetes Lab Results  Component Value Date   HGBA1C 6.0 (H) 01/19/2023   HGBA1C 5.8 (H) 09/03/2022   HGBA1C 5.4 01/29/2022    Wegovy therapy stopped 08/2022 due to lack of insurance coverage She is on daily Metformin 500mg  BID with meals  4. Emotional Eating Behavior 3/2024[facility] therapy stopped and she as was started on daily Wellbutrin SR 150mg  She denies hx of seizure d/o Post menopausal since 2018  Assessment/Plan:   1. S/P right knee arthroscopy Obtain "Shoe Lift" F/u with Ortho Care as directed  2. Vitamin D deficiency Continue Multiple Vitamin (MULTIVITAMIN WITH MINERALS) TABS tablet   3. Pre-Diabetes Refill metFORMIN (GLUCOPHAGE) 500 MG tablet Take 1 tablet (500 mg total) by mouth 2 (two) times daily with a meal. Dispense: 180 tablet, Refills: 0  of 0 remaining   4. Emotional Eating Behavior Refill and increase buPROPion (WELLBUTRIN SR) 200 MG 12 hr tablet Take 1 tablet (200 mg total) by mouth daily. Dispense: 30 tablet, Refills: 0 of 0 remaining   5. Obesity, Current BMI 43.1  Mackenzie [name] is currently in the action stage of change. As such, her goal is to continue with weight loss efforts. She has agreed to the Category 3 Plan.   Exercise goals: All adults should avoid inactivity. Some physical activity is better than  none, and adults who participate in any amount of physical activity gain some health benefits. Adults should also include muscle-strengthening activities that involve all major muscle groups on 2 or more days a week.  Behavioral modification strategies: increasing lean protein intake, decreasing simple carbohydrates, increasing vegetables, increasing water intake, meal planning and cooking strategies, keeping healthy foods in the home, ways to avoid boredom eating, emotional eating strategies, and planning for success.  Mackenzie [name] has agreed to follow-up with our clinic in 4 weeks. She was informed of the importance of frequent follow-up visits to maximize her success with intensive lifestyle modifications for her multiple health conditions.   Check Fasting Labs at next OV  Objective:   Blood pressure 138/74, pulse 89, temperature 97.8 F (36.6 C), height 5\' 5"  (1.651 m), weight 259 lb (117.5 kg), last menstrual period 11/28/2016, SpO2 96%, unknown if currently breastfeeding. Body mass index is 43.1 kg/m.  General: Cooperative, alert, well developed, in no acute distress. HEENT: Conjunctivae and lids unremarkable. Cardiovascular: Regular rhythm.  Lungs: Normal work of breathing. Neurologic: No focal deficits.   Lab Results  Component Value Date   CREATININE 0.99 03/31/2023   BUN 20 03/31/2023   NA 135 03/31/2023   K 4.9 03/31/2023   CL 99 03/31/2023   CO2 22 03/31/2023   Lab Results  Component Value Date   ALT 19 03/31/2023   AST 26 03/31/2023   GGT 19 03/01/2021   ALKPHOS 133 (H) 03/31/2023   BILITOT 0.3 03/31/2023   Lab Results  Component Value Date   HGBA1C 6.0 (H) 01/19/2023   HGBA1C 5.8 (H) 09/03/2022   HGBA1C 5.4 01/29/2022   HGBA1C 5.4 12/19/2021   HGBA1C 5.4 05/23/2021   Lab Results  Component Value Date   INSULIN 15.4 01/19/2023   INSULIN 13.1 09/03/2022   INSULIN 21.3 01/29/2022   INSULIN 18.2 01/07/2022   INSULIN 15.1 05/23/2021   Lab Results  Component  Value Date   TSH 3.81 01/13/2023   Lab Results  Component Value Date   CHOL 151 03/31/2023   HDL 51 03/31/2023   LDLCALC 90 03/31/2023   TRIG 43 03/31/2023   CHOLHDL 3.0 03/31/2023   Lab Results  Component Value Date   VD25OH 76.5 01/19/2023   VD25OH 81.2 09/03/2022   VD25OH 50.2 01/29/2022   Lab Results  Component Value Date   WBC 4.8 01/05/2023   HGB 12.4 01/05/2023   HCT 37.9 01/05/2023   MCV 86.1 01/05/2023   PLT 205 01/05/2023   No results found for: "IRON", "TIBC", "FERRITIN"  Attestation Statements:   Reviewed by clinician on day of visit: allergies, medications, problem list, medical history, surgical history, family history, social history, and previous encounter notes.  I have reviewed the above documentation for accuracy and completeness, and I agree with the above. -  Hank Walling d. Darly Fails,NP-C

## 2023-05-12 ENCOUNTER — Other Ambulatory Visit: Payer: Self-pay

## 2023-05-12 ENCOUNTER — Ambulatory Visit: Payer: Commercial Managed Care - PPO | Admitting: Plastic Surgery

## 2023-05-12 ENCOUNTER — Other Ambulatory Visit (HOSPITAL_COMMUNITY): Payer: Self-pay

## 2023-05-12 MED ORDER — MONTELUKAST SODIUM 10 MG PO TABS
10.0000 mg | ORAL_TABLET | Freq: Every day | ORAL | 0 refills | Status: DC
  Filled 2023-05-12: qty 90, 90d supply, fill #0

## 2023-05-12 MED ORDER — SPIRONOLACTONE 25 MG PO TABS
25.0000 mg | ORAL_TABLET | Freq: Every day | ORAL | 0 refills | Status: DC
  Filled 2023-05-12: qty 90, 90d supply, fill #0

## 2023-05-13 ENCOUNTER — Other Ambulatory Visit (HOSPITAL_COMMUNITY): Payer: Self-pay

## 2023-05-14 ENCOUNTER — Other Ambulatory Visit (HOSPITAL_COMMUNITY): Payer: Self-pay

## 2023-05-15 ENCOUNTER — Encounter: Payer: Self-pay | Admitting: Plastic Surgery

## 2023-05-15 ENCOUNTER — Ambulatory Visit: Payer: 59 | Admitting: Plastic Surgery

## 2023-05-15 VITALS — BP 129/87 | HR 72 | Ht 65.0 in | Wt 270.4 lb

## 2023-05-15 DIAGNOSIS — C50812 Malignant neoplasm of overlapping sites of left female breast: Secondary | ICD-10-CM | POA: Diagnosis not present

## 2023-05-15 DIAGNOSIS — Z923 Personal history of irradiation: Secondary | ICD-10-CM

## 2023-05-15 DIAGNOSIS — N651 Disproportion of reconstructed breast: Secondary | ICD-10-CM

## 2023-05-15 DIAGNOSIS — Z170 Estrogen receptor positive status [ER+]: Secondary | ICD-10-CM

## 2023-05-15 DIAGNOSIS — Z9889 Other specified postprocedural states: Secondary | ICD-10-CM | POA: Insufficient documentation

## 2023-05-15 NOTE — Progress Notes (Signed)
Patient ID: Mackenzie [name], female    DOB: [birth year], 52 y.o.   MRN: [id number]   Chief Complaint  Patient presents with   Follow-up    The patient is a 52 year old female here for her yearly physical after breast reconstruction.  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  She underwent left sided mastectomy with radiation and chemotherapy.  The tumor was estrogen and progesterone positive and HER2 negative.  For reconstruction she had a latissimus muscle flap in 2020 with an expander followed by implant placement.  She has a Dance movement psychotherapist ultrahigh profile 750 cc gel implant.  Today she did not complain of any back pain that has markedly improved.  Now she has some numbness of her left arm and fingers.  She is not sure that it is worse on the radial or ulnar side it seems pretty consistent.  On exam there are no areas of concern.  The implant appears to be intact.  No breast tenderness.    Review of Systems  Constitutional:  Negative for activity change and appetite change.  HENT: Negative.    Eyes: Negative.   Respiratory: Negative.  Negative for chest tightness.   Cardiovascular: Negative.   Gastrointestinal: Negative.   Endocrine: Negative.   Genitourinary: Negative.   Musculoskeletal: Negative.     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, left (HCC) 01/23/2017   Cellulitis of right leg    Edema, lower extremity    GERD (gastroesophageal reflux disease)    Healthcare maintenance 03/25/2017   Herpesviral infection    Hot flashes 03/2017   Hyperlipidemia 03/20/2016   Impaired fasting glucose    Morbid obesity with body mass index (BMI) of 40.0 to 49.9 (HCC) 09/17/2017    Neoplasm of cervix    Osteoarthritis of knee    Other chest pain    Other specified disorders of veins    Polyneuropathy    Postmenopausal atrophic vaginitis    Pre-diabetes 07/26/2018   Hgb A1C elevated on 07/26/2018, Gestational Diabetes 2012   Pure hypercholesterolemia 03/30/2023   Seasonal allergies 2012   seasonal allergies causes allergic rhinitis and itchy, dry eyes per pt   Stress incontinence    Tachycardia    Thyrotoxicosis    Vitamin D deficiency 05/2015    Past Surgical History:  Procedure Laterality Date   BREAST RECONSTRUCTION WITH PLACEMENT OF TISSUE EXPANDER AND FLEX HD (ACELLULAR HYDRATED DERMIS) Left 08/27/2017   Procedure: LEFT BREAST RECONSTRUCTION WITH PLACEMENT OF TISSUE EXPANDER AND FLEX HD;  Surgeon: Peggye Form, DO;  [facility];  Service: Plastics;  Laterality: Left;   CESAREAN SECTION     x2   KNEE ARTHROSCOPY WITH MEDIAL MENISECTOMY Right 11/12/2022   Procedure: RIGHT KNEE ARTHROSCOPY, PARTIAL MEDIAL MENISCECTOMY;  Surgeon: Tarry Kos, MD;  Location: [facility];  Service: Orthopedics;  Laterality: Right;   LATISSIMUS FLAP TO BREAST Left 11/03/2018   LATISSIMUS FLAP TO BREAST Left 11/03/2018   Procedure: LATISSIMUS FLAP TO LEFT BREAST;  Surgeon: Peggye Form, DO;  [facility];  Service: Plastics;  Laterality: Left;   MASTECTOMY MODIFIED RADICAL Left 08/27/2017  MASTECTOMY MODIFIED RADICAL Left 08/27/2017   Procedure: LEFT MODIFIED RADICAL MASTECTOMY;  Surgeon: Harriette Bouillon, MD;  [facility];  Service: General;  Laterality: Left;   MASTOPEXY Right 03/03/2019   Procedure: RIGHT BREAST MASTOPEXY/REDUCTION;  Surgeon: Peggye Form, DO;  Location: [facility];  Service: Plastics;  Laterality: Right;  3 hours, please   PORT-A-CATH REMOVAL Right 08/27/2017   Procedure: REMOVAL PORT-A-CATH RIGHT CHEST;  Surgeon: Harriette Bouillon, MD;  [facility];  Service: General;  Laterality: Right;   PORTACATH  PLACEMENT Right 01/28/2017   Procedure: INSERTION PORT-A-CATH WITH ULTRASOUND;  Surgeon: Harriette Bouillon, MD;  Location: [facility];  Service: General;  Laterality: Right;   REDUCTION MAMMAPLASTY Right    REMOVAL OF TISSUE EXPANDER AND PLACEMENT OF IMPLANT Left 03/03/2019   Procedure: REMOVAL OF TISSUE EXPANDER AND PLACEMENT OF EXPANDER;  Surgeon: Peggye Form, DO;  Location: [facility];  Service: Plastics;  Laterality: Left;   REMOVAL OF TISSUE EXPANDER AND PLACEMENT OF IMPLANT Left 05/25/2019   Procedure: LEFT BREAST REMOVAL OF TISSUE EXPANDER AND PLACEMENT OF IMPLANT;  Surgeon: Peggye Form, DO;  Location: [facility];  Service: Plastics;  Laterality: Left;   TISSUE EXPANDER PLACEMENT Left 11/03/2018   Procedure: PLACEMENT OF TISSUE EXPANDER LEFT BREAST;  Surgeon: Peggye Form, DO;  [facility];  Service: Plastics;  Laterality: Left;  Total case time is 3.5 hours   TUBAL LIGATION Bilateral 01/21/2011      Current Outpatient Medications:    acetaminophen (TYLENOL) 500 MG tablet, Take 1,000 mg by mouth every 6 (six) hours as needed (for pain/headaches.). , Disp: , Rfl:    Ascorbic Acid (VITAMIN C PO), Take by mouth daily., Disp: , Rfl:    buPROPion (WELLBUTRIN SR) 200 MG 12 hr tablet, Take 1 tablet (200 mg total) by mouth daily., Disp: 30 tablet, Rfl: 0   calcium carbonate (TUMS - DOSED IN MG ELEMENTAL CALCIUM) 500 MG chewable tablet, Chew 1 tablet by mouth 2 (two) times daily., Disp: , Rfl:    Calcium-Phosphorus-Vitamin D (VITAMIN D3/CALCIUM/PHOSPHORUS PO), Take 600 mg by mouth daily., Disp: , Rfl:    celecoxib (CELEBREX) 200 MG capsule, Take 1 capsule (200 mg total) by mouth 2 (two) times daily. (Patient taking differently: Take 200 mg by mouth daily.), Disp: 30 capsule, Rfl: 3   cetirizine (ZYRTEC) 10 MG tablet, Take 10 mg by mouth daily., Disp: , Rfl:    cyclobenzaprine (FLEXERIL) 10 MG tablet, Take 1/2 tablet (5 mg total)  by mouth 2 (two) times daily as needed., Disp: 30 tablet, Rfl: 0   diclofenac (VOLTAREN) 75 MG EC tablet, Take 1 tablet (75 mg total) by mouth 2 (two) times daily as needed., Disp: 60 tablet, Rfl: 0   gabapentin (NEURONTIN) 300 MG capsule, Take 1 capsule (300 mg total) by mouth 2 (two) times daily., Disp: 180 capsule, Rfl: 0   HYDROcodone-acetaminophen (NORCO) 5-325 MG tablet, Take 1 tablet by mouth every 6 (six) hours as needed., Disp: 20 tablet, Rfl: 0   ibuprofen (ADVIL) 800 MG tablet, Take 800 mg by mouth every 8 (eight) hours as needed (pain)., Disp: , Rfl:    letrozole (FEMARA) 2.5 MG tablet, Take 1 tablet (2.5 mg total) by mouth daily., Disp: 90 tablet, Rfl: 4   loratadine (CLARITIN) 10 MG tablet, Take 1 tablet (10 mg total) by mouth daily. (Patient taking differently: Take 10 mg by mouth daily as needed for allergies.), Disp: 90 tablet, Rfl:  2   losartan (COZAAR) 50 MG tablet, Take 1 tablet (50 mg total) by mouth daily., Disp: 90 tablet, Rfl: 0   metFORMIN (GLUCOPHAGE) 500 MG tablet, Take 1 tablet (500 mg total) by mouth 2 (two) times daily with a meal., Disp: 180 tablet, Rfl: 0   methimazole (TAPAZOLE) 5 MG tablet, Take 1 tablet (5 mg total) by mouth daily in the afternoon., Disp: 90 tablet, Rfl: 3   metoprolol succinate (TOPROL-XL) 50 MG 24 hr tablet, Take 1 tablet (50 mg total) by mouth daily. (Patient taking differently: Take 25 mg by mouth daily.), Disp: 90 tablet, Rfl: 0   montelukast (SINGULAIR) 10 MG tablet, Take 1 tablet (10 mg total) by mouth at bedtime., Disp: 90 tablet, Rfl: 0   Multiple Vitamin (MULTIVITAMIN WITH MINERALS) TABS tablet, Take 1 tablet by mouth daily. , Disp: , Rfl:    Omega-3 Fatty Acids (FISH OIL) 1000 MG CAPS, Take 1,000 mg by mouth 2 (two) times daily. , Disp: , Rfl:    omeprazole (PRILOSEC) 40 MG capsule, Take 1 capsule (40 mg total) by mouth daily., Disp: 90 capsule, Rfl: 1   ondansetron (ZOFRAN) 4 MG tablet, Take 1 tablet (4 mg total) by mouth every 8 (eight)  hours as needed for nausea or vomiting., Disp: 20 tablet, Rfl: 0   Probiotic Product (PROBIOTIC DAILY PO), Take 1 capsule by mouth 2 (two) times daily. , Disp: , Rfl:    rosuvastatin (CRESTOR) 10 MG tablet, Take 1 tablet (10 mg total) by mouth daily., Disp: 90 tablet, Rfl: 0   spironolactone (ALDACTONE) 25 MG tablet, Take 1 tablet (25 mg total) by mouth daily., Disp: 90 tablet, Rfl: 0   traMADol (ULTRAM) 50 MG tablet, Take 1 tablet (50 mg total) by mouth every 12 (twelve) hours as needed., Disp: 10 tablet, Rfl: 2   ZINC OXIDE PO, Take 1 tablet by mouth 2 (two) times a day., Disp: , Rfl:  No current facility-administered medications for this visit.  Facility-Administered Medications Ordered in Other Visits:    sodium chloride flush (NS) 0.9 % injection 10 mL, 10 mL, Intravenous, PRN, Magrinat, Valentino Hue, MD, 10 mL at 03/10/17 1239   Objective:   Vitals:   05/15/23 0839  BP: 129/87  Pulse: 72  SpO2: 96%    Physical Exam Vitals reviewed.  Constitutional:      Appearance: Normal appearance.  HENT:     Head: Atraumatic.  Cardiovascular:     Rate and Rhythm: Normal rate.     Pulses: Normal pulses.  Pulmonary:     Effort: Pulmonary effort is normal.  Abdominal:     Palpations: Abdomen is soft.  Musculoskeletal:        General: No swelling or tenderness.  Skin:    General: Skin is warm.     Capillary Refill: Capillary refill takes less than 2 seconds.     Coloration: Skin is not jaundiced.  Neurological:     Mental Status: She is alert and oriented to person, place, and time.  Psychiatric:        Mood and Affect: Mood normal.        Behavior: Behavior normal.     Assessment & Plan:  Malignant 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, estrogen receptor positive (HCC) - Plan: US BREAST COMPLETE UNI LEFT INC AXILLA  Breast asymmetry following reconstructive surgery - Plan: US BREAST COMPLETE UNI LEFT INC AXILLA  History of breast reconstruction  I would like to  go ahead  and get an ultrasound of the left breast.  I am also going to get her an appointment with her PCP for further evaluation.  She may need some nerve conduction studies on the left side or even an MRI to be sure she does not have a pinched nerve.  Patient is in agreement.  We will talk after I get the ultrasound.  Pictures were obtained of the patient and placed in the chart with the patient's or guardian's permission.   Alena Bills Lester Platas, DO

## 2023-05-19 ENCOUNTER — Other Ambulatory Visit: Payer: Self-pay

## 2023-05-19 DIAGNOSIS — M1711 Unilateral primary osteoarthritis, right knee: Secondary | ICD-10-CM

## 2023-05-26 ENCOUNTER — Telehealth: Payer: Self-pay

## 2023-05-26 ENCOUNTER — Encounter: Payer: Self-pay | Admitting: Physician Assistant

## 2023-05-26 ENCOUNTER — Ambulatory Visit (INDEPENDENT_AMBULATORY_CARE_PROVIDER_SITE_OTHER): Payer: 59 | Admitting: Physician Assistant

## 2023-05-26 DIAGNOSIS — M1711 Unilateral primary osteoarthritis, right knee: Secondary | ICD-10-CM

## 2023-05-26 MED ORDER — LIDOCAINE HCL 1 % IJ SOLN
2.0000 mL | INTRAMUSCULAR | Status: AC | PRN
  Administered 2023-05-26: 2 mL

## 2023-05-26 MED ORDER — HYALURONAN 88 MG/4ML IX SOSY
88.0000 mg | PREFILLED_SYRINGE | INTRA_ARTICULAR | Status: AC | PRN
  Administered 2023-05-26: 88 mg via INTRA_ARTICULAR

## 2023-05-26 MED ORDER — BUPIVACAINE HCL 0.25 % IJ SOLN
2.0000 mL | INTRAMUSCULAR | Status: AC | PRN
  Administered 2023-05-26: 2 mL via INTRA_ARTICULAR

## 2023-05-26 NOTE — Telephone Encounter (Signed)
Please precert for left knee gel injection. Dr.Xu's patient. Thanks!  

## 2023-05-26 NOTE — Progress Notes (Signed)
Office Visit Note   Patient: Mackenzie [name]           Date of Birth: 03-27-1971           MRN: [id number] Visit Date: 05/26/2023              Requested by: Dois Davenport, MD [address] [place],  [place] [address] PCP: Dois Davenport, MD   Assessment & Plan: Visit Diagnoses:  1. Unilateral primary osteoarthritis, right knee     Plan: Impression is right knee osteoarthritis.  Today, proceed with right knee Monovisc injection.  She tolerated this well.  In regards to the left knee, we will get approval for repeat viscosupplementation injection.  Follow-up once approved.  This patient is diagnosed with osteoarthritis of the knee(s).    Radiographs show evidence of joint space narrowing, osteophytes, subchondral sclerosis and/or subchondral cysts.  This patient has knee pain which interferes with functional and activities of daily living.    This patient has experienced inadequate response, adverse effects and/or intolerance with conservative treatments such as acetaminophen, NSAIDS, topical creams, physical therapy or regular exercise, knee bracing and/or weight loss.   This patient has experienced inadequate response or has a contraindication to intra articular steroid injections for at least 3 months.   This patient is not scheduled to have a total knee replacement within 6 months of starting treatment with viscosupplementation.   Follow-Up Instructions: Return for once approved for left knee visco injection.   Orders:  Orders Placed This Encounter  Procedures   Large Joint Inj: R knee   No orders of the defined types were placed in this encounter.     Procedures: Large Joint Inj: R knee on 05/26/2023 8:42 AM Indications: pain Details: 22 G needle, anterolateral approach Medications: 2 mL lidocaine 1 %; 2 mL bupivacaine 0.25 %; 88 mg Hyaluronan 88 MG/4ML      Clinical Data: No additional findings.   Subjective: Chief Complaint  Patient  presents with   Right Knee - Follow-up    Monovisc    HPI patient is a pleasant 52 year old female who comes in today with right knee osteoarthritis.  She is here for right knee Monovisc injection.  She has undergone gel injections in the past with good relief.  She is also complaining of left knee pain.  History of osteoarthritis there.  Previous viscosupplementation injections have helped.  She would like to repeat these if possible.     Objective: Vital Signs: LMP 11/28/2016     Ortho Exam right knee exam is unchanged  Specialty Comments:  No specialty comments available.  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   Boil of buttock, Left 12/24/2021   Hyperthyroidism 07/22/2021   Low serum thyroid stimulating hormone (TSH) 06/12/2021   Other fatigue 05/23/2021   Hemoglobin low 05/23/2021   Polyphagia 05/16/2020   Vitamin D deficiency 05/16/2020   At risk for osteoporosis 05/16/2020   Prediabetes 05/01/2020   Elevated blood pressure reading 05/01/2020   At risk for impaired metabolic function 05/01/2020   Breast  asymmetry following reconstructive surgery 09/20/2019   Essential hypertension 08/17/2018   Class 3 severe obesity with serious comorbidity and body mass index (BMI) of 40.0 to 44.9 in adult [facility]) 09/17/2017   Acquired absence of left breast 09/15/2017   Malignant neoplasm of upper-inner quadrant of left breast in female, 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   Healthcare maintenance 03/25/2017   Malignant neoplasm of  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, estrogen receptor positive (HCC) 01/23/2017   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, left (HCC) 01/23/2017   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, left (HCC) 01/23/2017   Cellulitis of right leg    Edema, lower extremity    GERD (gastroesophageal reflux disease)    Healthcare maintenance 03/25/2017   Herpesviral infection    Hot flashes 03/2017   Hyperlipidemia 03/20/2016   Impaired fasting glucose    Morbid obesity with body mass index (BMI) of 40.0 to 49.9 (HCC) 09/17/2017   Neoplasm of cervix    Osteoarthritis of knee    Other chest pain    Other specified disorders of veins    Polyneuropathy    Postmenopausal atrophic vaginitis    Pre-diabetes 07/26/2018   Hgb A1C elevated on 07/26/2018, Gestational Diabetes 2012   Pure hypercholesterolemia 03/30/2023   Seasonal allergies 2012   seasonal allergies causes allergic rhinitis and itchy, dry eyes per pt   Stress incontinence    Tachycardia    Thyrotoxicosis    Vitamin D deficiency 05/2015    Family History  Problem Relation Age of Onset   Other Mother        d.56 from complications of surgery to remove brain tumor   Diabetes Mother    Hypertension Mother    Stroke Mother    Kidney disease Father    Hypertension Maternal Grandmother    Breast cancer Paternal Grandmother 59       d.60s from breast cancer. Did not have treatment.   Hypertension Other    Diabetes Other    Stroke Other     Past Surgical History:  Procedure Laterality Date   BREAST RECONSTRUCTION WITH PLACEMENT OF TISSUE EXPANDER AND FLEX HD (ACELLULAR HYDRATED DERMIS) Left 08/27/2017   Procedure: LEFT BREAST  RECONSTRUCTION WITH PLACEMENT OF TISSUE EXPANDER AND FLEX HD;  Surgeon: Peggye Form, DO;  [facility];  Service: Plastics;  Laterality: Left;   CESAREAN SECTION     x2   KNEE ARTHROSCOPY WITH MEDIAL MENISECTOMY Right 11/12/2022   Procedure: RIGHT KNEE ARTHROSCOPY, PARTIAL MEDIAL MENISCECTOMY;  Surgeon: Tarry Kos, MD;  Location: [facility];  Service: Orthopedics;  Laterality: Right;   LATISSIMUS FLAP TO BREAST Left 11/03/2018   LATISSIMUS FLAP TO BREAST Left 11/03/2018   Procedure: LATISSIMUS FLAP TO LEFT BREAST;  Surgeon: Peggye Form, DO;  [facility];  Service: Plastics;  Laterality: Left;   MASTECTOMY MODIFIED RADICAL Left 08/27/2017   MASTECTOMY MODIFIED RADICAL Left 08/27/2017   Procedure: LEFT MODIFIED RADICAL MASTECTOMY;  Surgeon: Harriette Bouillon, MD;  [facility];  Service: General;  Laterality: Left;   MASTOPEXY Right 03/03/2019   Procedure: RIGHT BREAST MASTOPEXY/REDUCTION;  Surgeon: Peggye Form,  DO;  Location: [facility];  Service: Plastics;  Laterality: Right;  3 hours, please   PORT-A-CATH REMOVAL Right 08/27/2017   Procedure: REMOVAL PORT-A-CATH RIGHT CHEST;  Surgeon: Harriette Bouillon, MD;  [facility];  Service: General;  Laterality: Right;   PORTACATH PLACEMENT Right 01/28/2017   Procedure: INSERTION PORT-A-CATH WITH ULTRASOUND;  Surgeon: Harriette Bouillon, MD;  Location: [facility];  Service: General;  Laterality: Right;   REDUCTION MAMMAPLASTY Right    REMOVAL OF TISSUE EXPANDER AND PLACEMENT OF IMPLANT Left 03/03/2019   Procedure: REMOVAL OF TISSUE EXPANDER AND PLACEMENT OF EXPANDER;  Surgeon: Peggye Form, DO;  Location: [facility];  Service: Plastics;  Laterality: Left;   REMOVAL OF TISSUE EXPANDER AND PLACEMENT OF IMPLANT Left 05/25/2019   Procedure: LEFT BREAST REMOVAL OF TISSUE EXPANDER AND PLACEMENT OF IMPLANT;  Surgeon: Peggye Form, DO;  Location: [facility];  Service: Plastics;  Laterality: Left;   TISSUE EXPANDER PLACEMENT Left 11/03/2018   Procedure: PLACEMENT OF TISSUE EXPANDER LEFT BREAST;  Surgeon: Peggye Form, DO;  [facility];  Service: Plastics;  Laterality: Left;  Total case time is 3.5 hours   TUBAL LIGATION Bilateral 01/21/2011   Social History   Occupational History   Occupation: Charity fundraiser  Tobacco Use   Smoking status: Never   Smokeless tobacco: Never  Vaping Use   Vaping status: Never Used  Substance and Sexual Activity   Alcohol use: Yes    Comment: social   Drug use: No   Sexual activity: Not on file    Comment: BTL

## 2023-05-29 ENCOUNTER — Other Ambulatory Visit: Payer: Self-pay | Admitting: *Deleted

## 2023-05-29 ENCOUNTER — Other Ambulatory Visit (HOSPITAL_COMMUNITY): Payer: Self-pay

## 2023-05-29 ENCOUNTER — Other Ambulatory Visit: Payer: 59

## 2023-05-29 ENCOUNTER — Other Ambulatory Visit: Payer: Self-pay | Admitting: Physician Assistant

## 2023-05-29 ENCOUNTER — Telehealth: Payer: Self-pay | Admitting: Physician Assistant

## 2023-05-29 ENCOUNTER — Ambulatory Visit: Payer: 59 | Admitting: Hematology and Oncology

## 2023-05-29 DIAGNOSIS — Z170 Estrogen receptor positive status [ER+]: Secondary | ICD-10-CM

## 2023-05-29 NOTE — Telephone Encounter (Signed)
Pt called requesting refill of diclofenac. Please send to pharmacy on file. Pt phone number is [phone].

## 2023-05-31 MED ORDER — DICLOFENAC SODIUM 75 MG PO TBEC
75.0000 mg | DELAYED_RELEASE_TABLET | Freq: Two times a day (BID) | ORAL | 0 refills | Status: DC | PRN
  Filled 2023-05-31: qty 60, 30d supply, fill #0

## 2023-06-01 ENCOUNTER — Other Ambulatory Visit: Payer: Self-pay

## 2023-06-01 ENCOUNTER — Other Ambulatory Visit (HOSPITAL_COMMUNITY): Payer: Self-pay

## 2023-06-07 ENCOUNTER — Other Ambulatory Visit (HOSPITAL_COMMUNITY): Payer: Self-pay

## 2023-06-08 ENCOUNTER — Other Ambulatory Visit (HOSPITAL_COMMUNITY): Payer: Self-pay

## 2023-06-08 ENCOUNTER — Telehealth: Payer: Self-pay | Admitting: *Deleted

## 2023-06-08 ENCOUNTER — Other Ambulatory Visit: Payer: Self-pay

## 2023-06-08 ENCOUNTER — Encounter (INDEPENDENT_AMBULATORY_CARE_PROVIDER_SITE_OTHER): Payer: Self-pay | Admitting: Physician Assistant

## 2023-06-08 ENCOUNTER — Ambulatory Visit (INDEPENDENT_AMBULATORY_CARE_PROVIDER_SITE_OTHER): Payer: 59 | Admitting: Physician Assistant

## 2023-06-08 VITALS — BP 105/72 | HR 84 | Temp 98.1°F | Ht 65.0 in | Wt 258.0 lb

## 2023-06-08 DIAGNOSIS — Z6841 Body Mass Index (BMI) 40.0 and over, adult: Secondary | ICD-10-CM

## 2023-06-08 DIAGNOSIS — R7303 Prediabetes: Secondary | ICD-10-CM | POA: Diagnosis not present

## 2023-06-08 DIAGNOSIS — E7800 Pure hypercholesterolemia, unspecified: Secondary | ICD-10-CM | POA: Diagnosis not present

## 2023-06-08 DIAGNOSIS — E559 Vitamin D deficiency, unspecified: Secondary | ICD-10-CM

## 2023-06-08 DIAGNOSIS — I10 Essential (primary) hypertension: Secondary | ICD-10-CM | POA: Diagnosis not present

## 2023-06-08 DIAGNOSIS — R5383 Other fatigue: Secondary | ICD-10-CM | POA: Diagnosis not present

## 2023-06-08 DIAGNOSIS — E669 Obesity, unspecified: Secondary | ICD-10-CM | POA: Diagnosis not present

## 2023-06-08 DIAGNOSIS — R7989 Other specified abnormal findings of blood chemistry: Secondary | ICD-10-CM

## 2023-06-08 DIAGNOSIS — M5412 Radiculopathy, cervical region: Secondary | ICD-10-CM | POA: Diagnosis not present

## 2023-06-08 DIAGNOSIS — E785 Hyperlipidemia, unspecified: Secondary | ICD-10-CM | POA: Diagnosis not present

## 2023-06-08 DIAGNOSIS — F32A Depression, unspecified: Secondary | ICD-10-CM

## 2023-06-08 DIAGNOSIS — F3289 Other specified depressive episodes: Secondary | ICD-10-CM

## 2023-06-08 DIAGNOSIS — E871 Hypo-osmolality and hyponatremia: Secondary | ICD-10-CM | POA: Diagnosis not present

## 2023-06-08 MED ORDER — BUPROPION HCL ER (SR) 200 MG PO TB12
200.0000 mg | ORAL_TABLET | Freq: Every day | ORAL | 0 refills | Status: DC
  Filled 2023-06-08: qty 30, 30d supply, fill #0

## 2023-06-08 NOTE — Progress Notes (Addendum)
SUBJECTIVE: Discussed the use of AI scribe software for clinical note transcription with the patient, who gave verbal consent to proceed.  History of Present Illness     Chief Complaint: Obesity  Interim History: She is down 1 lb since her last visit.  Down 9 lbs overall TBW loss of 3.4%  Pharmacotherapy: 3/2024[facility] therapy stopped and she as was started on daily Wellbutrin SR 150mg - Recently increased to 200 mg daily.   Had gel injection to knee- Monovisc to right knee due to continued pain following surgery for meniscal tear but known OA of the knee.   Fasting labs were obtained today.  She was informed we would discuss her lab results at her next visit unless there is a critical issue that needs to be addressed sooner. She agreed to keep her next visit at the agreed upon time to discuss these results.    Decline Lipschutz is a 52 year old individual with a history of prediabetes, vitamin D deficiency, elevated lipoprotein A/hyperlipidemia and emotional eating behavior, presents for a follow-up visit regarding her obesity treatment plan.   The patient has recently been experiencing mobility issues due to a torn meniscus in her knee, which required surgical intervention in June 2024. Post-surgery, the patient has been using a cane intermittently and has received gel injections for persistent knee pain, likely exacerbated by underlying arthritis. The patient's mobility issues have not significantly impacted her work, as she primarily performs desk-based tasks.  The patient has been making efforts to adhere to her obesity treatment plan, with a focus on increasing protein intake. She has incorporated smoothies with protein and chia seeds into her breakfast routine and typically consumes a spinach salad with salmon and broccoli for lunch. Dinner often consists of another smoothie or a frozen meal. The patient has been trying to increase her physical activity, reporting more walking during  a recent vacation.  Despite these efforts, the patient has been struggling with snacking, particularly on sweets and chocolates received as gifts during the holiday season.  The patient is due to start physical therapy again for her knee and Achilles tendonitis. She has also been exploring chair aerobics and wall pilates as potential exercise options that accommodate her current physical limitations.  Tareva is here to discuss her progress with her obesity treatment plan. She is on the Category 3 Plan and states she is following her eating plan approximately 70 % of the time. She states she is not exercising 0 minutes 0 times per week.   OBJECTIVE: Visit Diagnoses: Problem List Items Addressed This Visit     Prediabetes - Primary   Relevant Orders   CMP14+EGFR   Hemoglobin A1c   Insulin, random   Vitamin D deficiency   Relevant Orders   VITAMIN D 25 Hydroxy (Vit-D Deficiency, Fractures)   Other fatigue   Relevant Orders   Vitamin B12   CBC with Differential/Platelet   Low serum thyroid stimulating hormone (TSH)   Relevant Orders   TSH   Depression   Relevant Medications   buPROPion (WELLBUTRIN SR) 200 MG 12 hr tablet   Obesity, Beginning BMI   Pure hypercholesterolemia   Relevant Orders   Lipid Panel With LDL/HDL Ratio   Obesity Follow-up for obesity management. Progress noted with weight loss and muscle gain:  muscle mass up 2 lbs, adipose tissue reduced by 3 lbs. Emphasized high-protein intake and consistency in meal planning. - Continue high-protein dietary plan. - Incorporate Kevin's prepared meats for convenience. - Recommend Ratio yogurt  for snacks. - Encourage consistent meal planning.  Emotional Eating Behavior Increased snacking during holidays, including chocolate and cookies. Discussed healthier snack options and consistency in habits. On Wellbutrin 200 mg daily. No side effects.  - Recommend Yasso yogurt bars. - Provide list of 100-calorie snacks. -  Encourage healthy snacking habits. Continue/Refill Wellbutrin 200 mg daily  Continue to work on emotional eating strategies.   Knee Osteoarthritis Recent meniscus tear and surgery in June. Minimal improvement from recent gel injections. Discussed benefits and limitations of gel injections. - Encourage physical therapy focusing on stretching and strengthening. - Recommend chair exercises.  Achilles Tendonitis Pain in the same leg as knee osteoarthritis. Starting physical therapy. Discussed importance of therapy to manage tendonitis and avoid exacerbating knee pain. - Start physical therapy. - Recommend chair exercises.  Prediabetes Lab Results  Component Value Date   HGBA1C 6.0 (H) 01/19/2023   HGBA1C 5.8 (H) 09/03/2022   HGBA1C 5.4 01/29/2022   Lab Results  Component Value Date   LDLCALC 90 03/31/2023   CREATININE 0.99 03/31/2023    Emphasized dietary management focusing on low sugar and high protein intake. Continue metformin 500 mg twice daily. No refill needed this visit.  Continue working on nutrition plan to decrease simple carbohydrates, increase lean proteins and exercise to promote weight loss, improve glycemic control and prevent progression to Type 2 diabetes.  - Continue dietary management. Recheck fasting insulin, A1c and CMET today as well as fasting lipids.   Vitamin D Deficiency Discussed importance of monitoring vitamin D levels and potential supplementation. She is on calcium + D only at this time.  Low vitamin D levels can be associated with adiposity and may result in leptin resistance and weight gain. Also associated with fatigue. Currently on vitamin D supplementation without any adverse effects.  - Order vitamin D level to optimize supplementation.   Fatigue: Endorses some fatigue. On MVI. On medications which may decrease B 12 absorption.  Recheck B 12, CBC, in addition to other labs to rule out common sources of fatigue.    History of low  TSH/Hyperthyroidism:  Asymptomatic currently. On methimazole per Dr. Lonzo Cloud.  Recheck TSH level today.   Elevated Lipoprotein A On Crestor 10 mg daily with last lipids improved. Discussed no current medication specifically for lipoprotein A currently.  Continue to work on nutrition plan -decreasing simple carbohydrates, increasing lean proteins, decreasing saturated fats and cholesterol , avoiding trans fats and exercise as able to promote weight loss, improve lipids and decrease cardiovascular risks. Recheck fasting lipids today.    General Health Maintenance Routine lab work needed. Previous labs in October showed high lipoprotein A. Patient is on Crestor. Discussed importance of comprehensive lab work. - Order comprehensive lab work including A1c, insulin, TSH, vitamin D, and B12. - Refill Wellbutrin prescription.  Follow-up - Schedule follow-up appointment with Katie in four weeks. Vitals Temp: 98.1 F (36.7 C) BP: 105/72 Pulse Rate: 84 SpO2: 95 %   Anthropometric Measurements Height: 5\' 5"  (1.651 m) Weight: 258 lb (117 kg) BMI (Calculated): 42.93 Weight at Last Visit: 1 lb Weight Lost Since Last Visit: 1 lb Weight Gained Since Last Visit: 0 Starting Weight: 267 lb Total Weight Loss (lbs): 9 lb (4.082 kg)   Body Composition  Body Fat %: 48 % Fat Mass (lbs): 124 lbs Muscle Mass (lbs): 127.4 lbs Total Body Water (lbs): 84.2 lbs Visceral Fat Rating : 16   Other Clinical Data Fasting: yes Labs: yes Today's Visit #: 71 Starting Date: 08/09/19  ASSESSMENT AND PLAN:  Diet: Claudean is currently in the action stage of change. As such, her goal is to continue with weight loss efforts. She has agreed to Category 3 Plan.  Exercise: Andrey has been instructed  exercise as directed by Physical therapy and consider starting some chair yoga or strengthening exercises from a seated position - examples on you tube  for weight loss and overall health  benefits.   Behavior Modification:  We discussed the following Behavioral Modification Strategies today: increasing lean protein intake, decreasing simple carbohydrates, increasing vegetables, increase H2O intake, increase high fiber foods, no skipping meals, emotional eating strategies , holiday eating strategies, and planning for success. We discussed various medication options to help Liya with her weight loss efforts and we both agreed to continue metformin for prediabetes, Wellbutrin for emotional eating and continue to work on nutritional and behavioral strategies to promote weight loss.  .  Return in about 4 weeks (around 07/06/2023).Marland Kitchen She was informed of the importance of frequent follow up visits to maximize her success with intensive lifestyle modifications for her multiple health conditions.  Attestation Statements:   Reviewed by clinician on day of visit: allergies, medications, problem list, medical history, surgical history, family history, social history, and previous encounter notes.   Time spent on visit including pre-visit chart review and post-visit care and charting was 43 minutes.    Boe Deans, PA-C

## 2023-06-08 NOTE — Telephone Encounter (Signed)
Mackenzie [name]- Message left for patient reminding her of [name] visit tomorrow including lab and Dr. Al Pimple.  Instructed patient to fast for labs at least 8 hrs and to bring completed medication diaries. Informed patient there is also a new consent addendum listing new side effects for Ribociclib to review and sign prior to her appointments. Patient returned call and confirmed appointment and above instructions.  [name] Reamer, BSN, RN, [employer] Clinical Research Nurse II [phone] 06/08/2023 10:28 AM

## 2023-06-09 ENCOUNTER — Other Ambulatory Visit: Payer: Self-pay

## 2023-06-09 ENCOUNTER — Ambulatory Visit: Payer: 59 | Admitting: Physical Therapy

## 2023-06-09 ENCOUNTER — Other Ambulatory Visit: Payer: Self-pay | Admitting: *Deleted

## 2023-06-09 ENCOUNTER — Encounter: Payer: Self-pay | Admitting: *Deleted

## 2023-06-09 ENCOUNTER — Inpatient Hospital Stay (HOSPITAL_BASED_OUTPATIENT_CLINIC_OR_DEPARTMENT_OTHER): Payer: 59 | Admitting: Hematology and Oncology

## 2023-06-09 ENCOUNTER — Inpatient Hospital Stay: Payer: 59 | Attending: Hematology and Oncology

## 2023-06-09 ENCOUNTER — Encounter: Payer: Self-pay | Admitting: Physical Therapy

## 2023-06-09 VITALS — BP 118/71 | HR 84 | Temp 97.9°F | Resp 18 | Ht 65.0 in | Wt 265.6 lb

## 2023-06-09 DIAGNOSIS — M6281 Muscle weakness (generalized): Secondary | ICD-10-CM

## 2023-06-09 DIAGNOSIS — Z006 Encounter for examination for normal comparison and control in clinical research program: Secondary | ICD-10-CM

## 2023-06-09 DIAGNOSIS — C773 Secondary and unspecified malignant neoplasm of axilla and upper limb lymph nodes: Secondary | ICD-10-CM | POA: Insufficient documentation

## 2023-06-09 DIAGNOSIS — C50812 Malignant neoplasm of overlapping sites of left female breast: Secondary | ICD-10-CM

## 2023-06-09 DIAGNOSIS — Z79811 Long term (current) use of aromatase inhibitors: Secondary | ICD-10-CM | POA: Diagnosis not present

## 2023-06-09 DIAGNOSIS — C50212 Malignant neoplasm of upper-inner quadrant of left female breast: Secondary | ICD-10-CM | POA: Diagnosis not present

## 2023-06-09 DIAGNOSIS — M25661 Stiffness of right knee, not elsewhere classified: Secondary | ICD-10-CM

## 2023-06-09 DIAGNOSIS — Z170 Estrogen receptor positive status [ER+]: Secondary | ICD-10-CM | POA: Insufficient documentation

## 2023-06-09 DIAGNOSIS — M25671 Stiffness of right ankle, not elsewhere classified: Secondary | ICD-10-CM | POA: Diagnosis not present

## 2023-06-09 DIAGNOSIS — R2689 Other abnormalities of gait and mobility: Secondary | ICD-10-CM

## 2023-06-09 DIAGNOSIS — M25561 Pain in right knee: Secondary | ICD-10-CM | POA: Diagnosis not present

## 2023-06-09 DIAGNOSIS — G8929 Other chronic pain: Secondary | ICD-10-CM

## 2023-06-09 DIAGNOSIS — M25571 Pain in right ankle and joints of right foot: Secondary | ICD-10-CM

## 2023-06-09 LAB — CBC WITH DIFFERENTIAL/PLATELET
Basophils Absolute: 0 10*3/uL (ref 0.0–0.2)
Basos: 1 %
EOS (ABSOLUTE): 0.1 10*3/uL (ref 0.0–0.4)
Eos: 2 %
Hematocrit: 44.4 % (ref 34.0–46.6)
Hemoglobin: 14 g/dL (ref 11.1–15.9)
Immature Grans (Abs): 0 10*3/uL (ref 0.0–0.1)
Immature Granulocytes: 0 %
Lymphocytes Absolute: 1.3 10*3/uL (ref 0.7–3.1)
Lymphs: 20 %
MCH: 27.1 pg (ref 26.6–33.0)
MCHC: 31.5 g/dL (ref 31.5–35.7)
MCV: 86 fL (ref 79–97)
Monocytes Absolute: 0.8 10*3/uL (ref 0.1–0.9)
Monocytes: 12 %
Neutrophils Absolute: 4.3 10*3/uL (ref 1.4–7.0)
Neutrophils: 65 %
Platelets: 285 10*3/uL (ref 150–450)
RBC: 5.17 x10E6/uL (ref 3.77–5.28)
RDW: 13.2 % (ref 11.7–15.4)
WBC: 6.5 10*3/uL (ref 3.4–10.8)

## 2023-06-09 LAB — CMP14+EGFR
ALT: 31 [IU]/L (ref 0–32)
AST: 21 [IU]/L (ref 0–40)
Albumin: 4.8 g/dL (ref 3.8–4.9)
Alkaline Phosphatase: 163 [IU]/L — ABNORMAL HIGH (ref 44–121)
BUN/Creatinine Ratio: 18 (ref 9–23)
BUN: 22 mg/dL (ref 6–24)
Bilirubin Total: 0.3 mg/dL (ref 0.0–1.2)
CO2: 24 mmol/L (ref 20–29)
Calcium: 10.4 mg/dL — ABNORMAL HIGH (ref 8.7–10.2)
Chloride: 97 mmol/L (ref 96–106)
Creatinine, Ser: 1.19 mg/dL — ABNORMAL HIGH (ref 0.57–1.00)
Globulin, Total: 3.1 g/dL (ref 1.5–4.5)
Glucose: 103 mg/dL — ABNORMAL HIGH (ref 70–99)
Potassium: 4.8 mmol/L (ref 3.5–5.2)
Sodium: 137 mmol/L (ref 134–144)
Total Protein: 7.9 g/dL (ref 6.0–8.5)
eGFR: 54 mL/min/{1.73_m2} — ABNORMAL LOW (ref >59)

## 2023-06-09 LAB — LIPID PANEL WITH LDL/HDL RATIO
Cholesterol, Total: 197 mg/dL (ref 100–199)
HDL: 56 mg/dL (ref >39)
LDL Chol Calc (NIH): 124 mg/dL — ABNORMAL HIGH (ref 0–99)
LDL/HDL Ratio: 2.2 {ratio} (ref 0.0–3.2)
Triglycerides: 93 mg/dL (ref 0–149)
VLDL Cholesterol Cal: 17 mg/dL (ref 5–40)

## 2023-06-09 LAB — CBC WITH DIFFERENTIAL (CANCER CENTER ONLY)
Abs Immature Granulocytes: 0.02 10*3/uL (ref 0.00–0.07)
Basophils Absolute: 0.1 10*3/uL (ref 0.0–0.1)
Basophils Relative: 1 %
Eosinophils Absolute: 0.1 10*3/uL (ref 0.0–0.5)
Eosinophils Relative: 1 %
HCT: 39.9 % (ref 36.0–46.0)
Hemoglobin: 13.4 g/dL (ref 12.0–15.0)
Immature Granulocytes: 0 %
Lymphocytes Relative: 19 %
Lymphs Abs: 1.3 10*3/uL (ref 0.7–4.0)
MCH: 28.1 pg (ref 26.0–34.0)
MCHC: 33.6 g/dL (ref 30.0–36.0)
MCV: 83.6 fL (ref 80.0–100.0)
Monocytes Absolute: 0.9 10*3/uL (ref 0.1–1.0)
Monocytes Relative: 12 %
Neutro Abs: 4.7 10*3/uL (ref 1.7–7.7)
Neutrophils Relative %: 67 %
Platelet Count: 247 10*3/uL (ref 150–400)
RBC: 4.77 MIL/uL (ref 3.87–5.11)
RDW: 14.3 % (ref 11.5–15.5)
WBC Count: 7 10*3/uL (ref 4.0–10.5)
nRBC: 0 % (ref 0.0–0.2)

## 2023-06-09 LAB — CMP (CANCER CENTER ONLY)
ALT: 28 U/L (ref 0–44)
AST: 21 U/L (ref 15–41)
Albumin: 4.5 g/dL (ref 3.5–5.0)
Alkaline Phosphatase: 132 U/L — ABNORMAL HIGH (ref 38–126)
Anion gap: 6 (ref 5–15)
BUN: 28 mg/dL — ABNORMAL HIGH (ref 6–20)
CO2: 28 mmol/L (ref 22–32)
Calcium: 9.5 mg/dL (ref 8.9–10.3)
Chloride: 98 mmol/L (ref 98–111)
Creatinine: 1.1 mg/dL — ABNORMAL HIGH (ref 0.44–1.00)
GFR, Estimated: 60 mL/min — ABNORMAL LOW (ref >60)
Glucose, Bld: 121 mg/dL — ABNORMAL HIGH (ref 70–99)
Potassium: 4 mmol/L (ref 3.5–5.1)
Sodium: 132 mmol/L — ABNORMAL LOW (ref 135–145)
Total Bilirubin: 0.5 mg/dL (ref 0.0–1.2)
Total Protein: 8.5 g/dL — ABNORMAL HIGH (ref 6.5–8.1)

## 2023-06-09 LAB — BILIRUBIN, DIRECT: Bilirubin, Direct: <0.1 mg/dL (ref 0.0–0.2)

## 2023-06-09 LAB — TSH: TSH: 3.1 u[IU]/mL (ref 0.450–4.500)

## 2023-06-09 LAB — GAMMA GT: GGT: 49 U/L (ref 7–50)

## 2023-06-09 LAB — VITAMIN B12: Vitamin B-12: >2000 pg/mL — ABNORMAL HIGH (ref 232–1245)

## 2023-06-09 LAB — HEMOGLOBIN A1C
Est. average glucose Bld gHb Est-mCnc: 128 mg/dL
Hgb A1c MFr Bld: 6.1 % — ABNORMAL HIGH (ref 4.8–5.6)

## 2023-06-09 LAB — MAGNESIUM: Magnesium: 2.2 mg/dL (ref 1.7–2.4)

## 2023-06-09 LAB — INSULIN, RANDOM: INSULIN: 24.1 u[IU]/mL (ref 2.6–24.9)

## 2023-06-09 LAB — PHOSPHORUS: Phosphorus: 3.7 mg/dL (ref 2.5–4.6)

## 2023-06-09 LAB — LIPASE, BLOOD: Lipase: 33 U/L (ref 11–51)

## 2023-06-09 LAB — LACTATE DEHYDROGENASE: LDH: 171 U/L (ref 98–192)

## 2023-06-09 LAB — URIC ACID: Uric Acid, Serum: 4.2 mg/dL (ref 2.5–7.1)

## 2023-06-09 LAB — VITAMIN D 25 HYDROXY (VIT D DEFICIENCY, FRACTURES): Vit D, 25-Hydroxy: 65.8 ng/mL (ref 30.0–100.0)

## 2023-06-09 LAB — AMYLASE: Amylase: 84 U/L (ref 28–100)

## 2023-06-09 NOTE — Therapy (Signed)
 OUTPATIENT PHYSICAL THERAPY LOWER EXTREMITY EVALUATION   Patient Name: Mackenzie [name] MRN: [id number] DOB:03-15-1971, 52 y.o., female Today's Date: 06/09/2023  END OF SESSION:  PT End of Session - 06/09/23 1233     Visit Number 1    Number of Visits 7    Date for PT Re-Evaluation 07/23/23    Authorization Type [facility] [employer], deductible met    PT Start Time 1122    PT Stop Time 1235    PT Time Calculation (min) 73 min    Activity Tolerance Patient tolerated treatment well;Patient limited by pain    Behavior During Therapy [facility] for tasks assessed/performed             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, left (HCC) 01/23/2017   Cellulitis of right leg    Edema, lower extremity    GERD (gastroesophageal reflux disease)    Healthcare maintenance 03/25/2017   Herpesviral infection    Hot flashes 03/2017   Hyperlipidemia 03/20/2016   Impaired fasting glucose    Morbid obesity with body mass index (BMI) of 40.0 to 49.9 (HCC) 09/17/2017   Neoplasm of cervix    Osteoarthritis of knee    Other chest pain    Other specified disorders of veins    Polyneuropathy    Postmenopausal atrophic vaginitis    Pre-diabetes 07/26/2018   Hgb A1C elevated on 07/26/2018, Gestational Diabetes 2012   Pure hypercholesterolemia 03/30/2023   Seasonal allergies 2012   seasonal allergies causes allergic rhinitis and itchy, dry eyes per pt   Stress incontinence    Tachycardia    Thyrotoxicosis    Vitamin D  deficiency 05/2015   Past Surgical History:  Procedure Laterality Date   BREAST RECONSTRUCTION WITH PLACEMENT OF TISSUE EXPANDER AND FLEX HD (ACELLULAR HYDRATED DERMIS) Left 08/27/2017   Procedure: LEFT  BREAST RECONSTRUCTION WITH PLACEMENT OF TISSUE EXPANDER AND FLEX HD;  Surgeon: Lowery Estefana RAMAN, DO;  Location: [facility];  Service: Plastics;  Laterality: Left;   CESAREAN SECTION     x2   KNEE ARTHROSCOPY WITH MEDIAL MENISECTOMY Right 11/12/2022   Procedure: RIGHT KNEE ARTHROSCOPY, PARTIAL MEDIAL MENISCECTOMY;  Surgeon: Jerri Kay HERO, MD;  Location: [facility];  Service: Orthopedics;  Laterality: Right;   LATISSIMUS FLAP TO BREAST Left 11/03/2018   LATISSIMUS FLAP TO BREAST Left 11/03/2018   Procedure: LATISSIMUS FLAP TO LEFT BREAST;  Surgeon: Lowery Estefana RAMAN, DO;  Location: [facility];  Service: Plastics;  Laterality: Left;   MASTECTOMY MODIFIED RADICAL Left 08/27/2017   MASTECTOMY MODIFIED RADICAL Left 08/27/2017   Procedure: LEFT MODIFIED RADICAL MASTECTOMY;  Surgeon: Vanderbilt Ned, MD;  Location: [facility];  Service: General;  Laterality: Left;   MASTOPEXY Right 03/03/2019   Procedure: RIGHT BREAST MASTOPEXY/REDUCTION;  Surgeon: Lowery Estefana RAMAN, DO;  Location: [facility];  Service: Plastics;  Laterality: Right;  3 hours, please   PORT-A-CATH REMOVAL Right 08/27/2017   Procedure: REMOVAL PORT-A-CATH RIGHT CHEST;  Surgeon: Vanderbilt Ned, MD;  Location: [facility];  Service: General;  Laterality: Right;   PORTACATH PLACEMENT Right 01/28/2017   Procedure: INSERTION PORT-A-CATH WITH ULTRASOUND;  Surgeon: Vanderbilt Ned, MD;  Location:  SURGERY  CENTER;  Service: General;  Laterality: Right;   REDUCTION MAMMAPLASTY Right    REMOVAL OF TISSUE EXPANDER AND PLACEMENT OF IMPLANT Left 03/03/2019   Procedure: REMOVAL OF TISSUE EXPANDER AND PLACEMENT OF EXPANDER;  Surgeon: Lowery Estefana RAMAN, DO;  Location: [facility];  Service: Plastics;  Laterality: Left;   REMOVAL OF TISSUE EXPANDER AND PLACEMENT OF IMPLANT Left 05/25/2019   Procedure: LEFT BREAST REMOVAL OF TISSUE EXPANDER AND PLACEMENT OF IMPLANT;  Surgeon: Lowery Estefana RAMAN, DO;  Location: [facility];  Service: Plastics;  Laterality: Left;   TISSUE EXPANDER PLACEMENT Left 11/03/2018   Procedure: PLACEMENT OF TISSUE EXPANDER LEFT BREAST;  Surgeon: Lowery Estefana RAMAN, DO;  Location: [facility];  Service: Plastics;  Laterality: Left;  Total case time is 3.5 hours   TUBAL LIGATION Bilateral 01/21/2011   Patient Active Problem List   Diagnosis Date Noted   History of breast reconstruction 05/15/2023   Pure hypercholesterolemia 03/30/2023   Acute meniscal tear, medial, right, initial encounter 11/12/2022   Plica syndrome of right knee 11/12/2022   Chondromalacia, right knee 11/12/2022   Depression 08/13/2022   BMI 40.0-44.9, adult (HCC) 08/13/2022   Obesity, Beginning BMI 08/13/2022   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   Boil of buttock, Left 12/24/2021   Hyperthyroidism 07/22/2021   Low serum thyroid  stimulating hormone (TSH) 06/12/2021   Other fatigue 05/23/2021   Hemoglobin low 05/23/2021   Polyphagia 05/16/2020   Vitamin D  deficiency 05/16/2020   At risk for osteoporosis 05/16/2020   Prediabetes 05/01/2020   Elevated blood pressure reading 05/01/2020   At risk for impaired metabolic function 05/01/2020   Breast asymmetry following reconstructive surgery 09/20/2019   Essential hypertension 08/17/2018   Class 3 severe obesity with serious comorbidity and body mass index (BMI) of 40.0 to 44.9 in adult [facility]) 09/17/2017   Acquired absence of left breast 09/15/2017   Malignant neoplasm of upper-inner quadrant of left breast in female, 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   Healthcare maintenance 03/25/2017   Malignant 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, estrogen receptor positive (HCC) 01/23/2017   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, left (HCC) 01/23/2017    PCP: [name], MD  REFERRING PROVIDER: Jule Ronal CROME, PA-C  REFERRING DIAG: M17.11 (ICD-10-CM) - Unilateral primary osteoarthritis, right  knee   THERAPY DIAG:  Chronic pain of right knee - Plan: PT plan of care cert/re-cert  Pain in right ankle and joints of right foot - Plan: PT plan of care cert/re-cert  Other abnormalities of gait and mobility - Plan: PT plan of care cert/re-cert  Muscle weakness (generalized) - Plan: PT plan of care cert/re-cert  Stiffness of right knee, not elsewhere classified - Plan: PT plan of care cert/re-cert  Stiffness of right ankle, not elsewhere classified - Plan: PT plan of care cert/re-cert  Rationale for Evaluation and Treatment: Rehabilitation  ONSET DATE: 05/26/2023 referral to PT  SUBJECTIVE:   SUBJECTIVE STATEMENT: She underwent had Right knee arthroscopy 11/12/22 with initial injury March.  She had PT 12/03/2022 - 02/03/2023 for 18 visits and knee improved.  She was working from home and started back at hospital end of August.  This required long walk in/out of hospital and primary job duties desk job.   Pain returned in October. She continued with her exercises.  Stairs and arising from seated position if stiff increases pain.  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  She had steroid injection in Aug. And gel injection 2 weeks ago with initial relief but returned more medial.  A knee replacement is eventually on table but she wants to hold off as long as possible.   PERTINENT HISTORY: Right knee arthroscopy 11/12/22, History of Lt breast cancer, GERD, hyperlipidemia,   PAIN:  NPRS scale: knee today 4-5/10 and in last week at rest 0/10, with activity 2-3/10 up to 7-8/10;  achilles tendon today 6-7/10 and in last week 0/10 at rest, 4-5/10 up to 8-9/10 Pain location: right knee more medial/anterior, and Achilles tendon Pain description: knee at times sharp / throbbing, general dull ache;  Achilles is pulling, sharp stretch Aggravating factors: walking esp stairs, getting up when stiff,  Relieving factors: meds, elevate, rest  PRECAUTIONS:  NO BP LUE  WEIGHT BEARING RESTRICTIONS: No  FALLS:  Has patient fallen in last 6 months? No  LIVING ENVIRONMENT: Lives with: lives with their daughter 12yo Lives in: House Stairs: Yes: External: 3 steps; none;  sunken den single step no rail.   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   Next MD visit:  not scheduled  OBJECTIVE:  DIAGNOSTIC FINDINGS: 04/21/2023 right ankle X-ray showed Retrocalcaneal osteophyte formation  PATIENT SURVEYS:  FOTO intake:  52%  predicted:  64%  COGNITION: Overall cognitive status: WFL    SENSATION: WFL  PALPATION: Right knee tenderness to palpation medial joint line and quadriceps tendon.  Denies tenderness to Quad muscle, Patella Tendon or other (not medial) areas of joint line.  Right ankle tenderness to palpation Achilles Tendon. Denies tenderness to Gastroc belly, Plantar Fascia or with Great Toe extension.   LOWER EXTREMITY ROM:   ROM Right eval  Hip flexion   Hip extension   Hip abduction   Hip adduction   Hip internal rotation   Hip external rotation   Knee flexion Supine A: 105* P: 112* with pain medial knee / distal quad  Knee extension Supine A: quad set -5* with pain in proximal gastroc  Ankle dorsiflexion Supine with Knee extended  A: +9* P: +10* Knee pain/none Achielles Supine with knee flexed A: 16* P: 20*  Ankle plantarflexion Supine A: 21* P: 27* Knee pain/none Achielles  Ankle inversion   Ankle eversion    (Blank rows = not tested)  LOWER EXTREMITY MMT:  MMT Right eval Left eval  Hip flexion    Hip extension    Hip abduction    Hip adduction    Hip internal rotation    Hip external rotation    Knee flexion 4/5   Knee extension 4/5   Ankle dorsiflexion    Ankle plantarflexion 3-/5   Ankle inversion     Ankle eversion     (Blank rows = not tested)  FUNCTIONAL TESTS:  18 inch chair transfer: able to arise without UE assist but weight shifts to left 5 times sit to stand: 16.53 sec Lt SLS: 1st attempt 3.96 sec 2nd attempt 11.84 sec Rt SLS: 1st attempt 7.00 sec 2nd attempt 12.89 sec  GAIT: Distance walked: 100'  Assistive device utilized: Single point cane Level of assistance: SBA Comments: antalgic with decreased stance duration RLE, right knee flexed in stance, early heel rise in terminal stance with little to no push-off noted.    TODAY'S  TREATMENT                                                                          DATE: 06/09/2023:  Therex: PT instructed verbally in well-rounded exercise program to include flexibility / stretches, strengthening, endurance and balance.   PT reviewed both former exercise programs using Medbridge to determine what she is doing.  See Medbridge code  [email or web address] and [email or web address]  HEP instruction/performance c cues for techniques, handout provided.  Trial set performed of each for comprehension and symptom assessment.  See below for exercise list  PATIENT EDUCATION:  Education details: HEP, POC Person educated: Patient Education method: Explanation, Demonstration, Verbal cues, and Handouts Education comprehension: verbalized understanding, returned demonstration, and verbal cues required  HOME EXERCISE PROGRAM: Access Code: [email or web address] URL: [email or web address] Date: 06/09/2023 Prepared by: Grayce Spatz  Exercises - Seated Hamstring Stretch  - 1 x daily - 7 x weekly - 2-3 reps - 30 seconds hold - Gastroc Stretch on Step  - 1 x daily - 7 x weekly - 1 sets - 2-3 reps - 30 seconds hold - standing calf stretch with forefoot on small step or brick  - 1 x daily - 7 x weekly - 1 sets - 2-3 reps - 30 seconds hold - Standing Soleus Stretch on Foam 1/2 Roll  - 1 x daily - 7 x weekly - 1 sets - 2-3 reps - 30 seconds hold - Modified Thomas Stretch   - 1 x daily - 7 x weekly - 1 sets - 2-3 reps - 30 seconds hold - Supine Quadriceps Stretch with Strap on Table  - 1 x daily - 7 x weekly - 1 sets - 2-3 reps - 30 seconds hold  ASSESSMENT: CLINICAL IMPRESSION: Patient is a 52 y.o. who comes to clinic with complaints of right knee and Achilles pain with mobility, strength and movement coordination deficits that impair their ability to perform usual daily and recreational functional activities without increase difficulty/symptoms at this time.  Patient to benefit from skilled PT services to address impairments and limitations to improve to previous level of function without restriction secondary to condition.   OBJECTIVE IMPAIRMENTS: Abnormal gait, decreased activity tolerance, decreased balance, decreased endurance, decreased knowledge of condition, decreased knowledge of use of DME, decreased mobility, difficulty walking, decreased ROM, decreased strength, impaired flexibility, obesity, and pain.   ACTIVITY LIMITATIONS: carrying, lifting, bending, sitting, standing, squatting, sleeping, stairs, transfers, and locomotion level  PARTICIPATION LIMITATIONS: meal prep, cleaning, community activity, and occupation  PERSONAL FACTORS: Fitness, Past/current experiences, Time since onset of injury/illness/exacerbation, and 3+ comorbidities: see PMH  are also affecting patient's functional outcome.   REHAB POTENTIAL: Good  CLINICAL DECISION MAKING: Stable/uncomplicated  EVALUATION COMPLEXITY: Moderate   GOALS: Goals reviewed with patient? Yes  SHORT TERM GOALS: (target date for Short term goals 06/29/2023)   1.  Patient will demonstrate independent use of home exercise program to maintain progress from in clinic treatments. Baseline: See objective data Goal status: Initial  2. PROM right knee 0* - 110* Baseline: See objective data Goal status: Initial  3. PROM right ankle Dorsiflexion +15* with knee extended Baseline: See objective data Goal  status: Initial  LONG TERM GOALS: (target dates for all long  term goals  08/06/2023 )   1. Patient will demonstrate/report pain at worst less than or equal to 2/10 to facilitate minimal limitation in daily activity secondary to pain symptoms. Baseline: See objective data Goal status: Initial   2. Patient will demonstrate independent use of home exercise program to facilitate ability to maintain/progress functional gains from skilled physical therapy services. Baseline: See objective data Goal status: Initial   3. Patient will demonstrate FOTO outcome > or = 64 % to indicate reduced disability due to condition. Baseline: See objective data Goal status: Initial   4.  Patient will demonstrate right knee and ankle LE MMT 5/5 throughout to faciltiate usual transfers, stairs, squatting at [facility] for daily life.  Baseline: See objective data Goal status: Initial   5.  Patient able to negotiate stairs with single rail alternating pattern modified independent with knee pain </= 2/10. Baseline: See objective data Goal status: Initial    PLAN:  PT FREQUENCY:  1x/week  PT DURATION: 6 weeks  PLANNED INTERVENTIONS: 97164- PT Re-evaluation, 97110-Therapeutic exercises, 97530- Therapeutic activity, 97112- Neuromuscular re-education, 97535- Self Care, 02859- Manual therapy, [phone]- Gait training, Patient/Family education, Balance training, Stair training, Taping, Dry Needling, Joint mobilization, DME instructions, Cryotherapy, and Moist heat  PLAN FOR NEXT SESSION: Review & Update HEP.    Grayce Spatz, PT, DPT 06/09/2023, 1:04 PM

## 2023-06-09 NOTE — Research (Signed)
 Mackenzie [name] to Evaluate Efficacy and Safety of Ribociclib  With Endocrine Therapy as Adjuvant Treatment in Patients With HR+/HER2- Early Breast Cancer   EOT visit: [name] arrived today for her scheduled EOT on the Mackenzie protocol.  Re-consent: [name] currently participates in the above listed study.  This Clinical Research Nurse met with SILVA AAMODT, [id number], on 06/09/23 in a manner and location that ensures patient privacy to discuss reconsent. Patient is Unaccompanied.  A copy of the informed consent Addendum #2 , v5.1 was provided to the patient.  Patient reads, speaks, and understands English.    Consent amendments and changes were presented by this clinical research Nurse. Mackenzie [name] was provided an opportunity to ask questions and all questions were answered to patient's satisfaction.  The updated informed consent was reviewed page by page.  The patient's mental and emotional status is appropriate to provide informed consent, and the patient verbalizes an interest in continuing voluntary study participation.  Patient has voluntarily signed reconsent Addendum #2 to PICF v5.1 on 06/09/23 at 9 am.  The patient was provided with a copy of the signed informed consent form for their reference.  No study specific procedures were obtained prior to the signing of the informed consent document.  Approximately 10 minutes were spent with the patient reviewing the informed consent documents.    PROs:  Provided to patient after re-consent. Collected and reviewed for accuracy and completeness.   Labs: Blood collected per protocol for CBC, CMET, Lipase, LDH, GGT, Direct Bilirubin, Amylase, Phosphorus, Magnesium and Uric Acid.  Corrected Calcium  Formula:  4 g/dL utilized for normal albumin ; local normal range is 3.5-5 g/dL. (0.8 x [normal albumin  - patient's albumin ]) + serum calcium  Correct Calcium  = (0.8 x[4- 4.5]) + 9.5 =  9.1. Lab results reviewed  by Dr. Loretha.  Dr. Loretha instructs for patient to follow up with her PCP regarding elevated BUN/Creatinine and low EGFR <60. Patient states she has appointment with her PCP, Dr. Burney, on 06/11/23 and she will show her lab results from her MyChart record.  Dr. Loretha states that abnormal chemistry results are not related to any study drugs or intervention.  Hormone levels also collected per Dr. Loretha to monitor menopausal status. Results can take several weeks to be available and will be reviewed at that time.   Healthcare Resource Utilization: Patient did not have any ED visits or hospitalizations since last study contact. She did not have any visits in our clinic for any issues related to her breast cancer or treatment since her last study visit.   Serious Adverse Events: No SAEs since last study contact.   H & P and Clinical Eval for Recurrence:  See Dr. Walter progress note dated today.   ECG: Completed per protocol on study provided ECG machine. 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 Loretha.  Dr. Loretha ordered ECG to be repeated in clinic for patient's medical record. ECG repeated with clinic ECG machine also reviewed by Dr. Loretha.  She states this ECG is normal and no follow up needed.   Endocrine Treatment: Patient returned her completed medication diaries for past 6 months of Letrozole . Patient reports no missed doses in the past 6 months. Last entry in diary is 05/31/23 as she completed 5 years of ET on study on that date. Patient will continue to take letrozole  daily for total of 7 years as directed by Dr.  Iruku. Patient verbalized understanding.  Plan: Patient will return to clinic in 6 months for follow up visit due . Research nurse will also call her on 06/30/23 to conduct 30 day safety follow up visit by phone. Patient agreed to this plan.  Patient aware that she should contact the clinic at any time if she has  any questions or concerns prior to her next visit.  Patient thanked for her ongoing participation in the [place] [name].  Cherylyn Hoard, BSN, RN, [facility] Nurse II [phone] 06/09/2023

## 2023-06-10 LAB — FOLLICLE STIMULATING HORMONE: FSH: 22.6 m[IU]/mL

## 2023-06-10 LAB — LUTEINIZING HORMONE: LH: 8.7 m[IU]/mL

## 2023-06-10 NOTE — Assessment & Plan Note (Signed)
 53 y.o. [place] woman status post left breast upper inner quadrant biopsy 01/14/2017 for a clinical T3 N2, stage IIA invasive ductal carcinoma, grade 1 or 2, estrogen and progesterone receptor positive, HER-2 nonamplified, with an MIB-1 of 20%.   (1) staging studies: Brain MRI, bone scan, and CT scan of the chest 02/05/2017 showed no brain lesions, no lung or liver lesions, a 4.9 cm mass in the left breast with left axillary and subpectoral adenopathy, and nonspecific bone scan tracer at L2, left scapula, and anterior ribs, with lumbar spine MRI suggested for further evaluation.             (a) lumbar spine MRI 02/17/2017 showed no normal bone lesions.  There was mild lumbar spondylosis   (2) neoadjuvant chemotherapy consisting of cyclophosphamide  and doxorubicin  in dose dense fashion 4 started 02/10/2017, completed 03/24/2017, followed by weekly carboplatin  and gemcitabine  given days 1 and 8 of each 21-day cycle starting 04/14/2017, completing the planned 4 cycles 06/26/2017   (3) is post left modified radical mastectomy on 08/27/2017 showing an mpT3 pN2 residual invasive ductal carcinoma, grade 2, with a residual cancer burden of 3.  Margins were clear             (a) latissimus flap reconstruction with expander 11/03/2018   (4) postmastectomy radiation completed 01/01/2018             (a) capecitabine  radiosensitization 11/16/2017-01/01/2018   (5) goserelin started 09/21/2017             (a) letrozole  started 01/11/2018             (b) enrolled in [name] clinical trial, randomized to ribociclib  on 05/31/2018             (c) Bone density on 03/16/2018 was normal with T score of -0.7 in the L1-L4 spine   (6) genetics testing 03/24/2017 through the Common Hereditary Cancer Panel offered by [facility] found no deleterious mutations in APC, ATM, AXIN2, BARD1, BMPR1A, BRCA1, BRCA2, BRIP1, CDH1, CDKN2A (p14ARF), CDKN2A (p16INK4a), CHEK2, CTNNA1, DICER1, EPCAM (Deletion/duplication testing only), GREM1  (promoter region deletion/duplication testing only), KIT, MEN1, MLH1, MSH2, MSH3, MSH6, MUTYH, NBN, NF1, NHTL1, PALB2, PDGFRA, PMS2, POLD1, POLE, PTEN, RAD50, RAD51C, RAD51D, SDHB, SDHC, SDHD, SMAD4, SMARCA4. STK11, TP53, TSC1, TSC2, and VHL.  The following genes were evaluated for sequence changes only: SDHA and HOXB13 c.251G>A variant only.     (7) Right breast reduction and left breast expander placement on 03/03/2019 with latissimus flap placement             (a) left implant implant exchanged on 05/25/2019 or a [employer] Smooth Round Ultra High Profile Gel 750cc. Ref #[id number].  Serial Number [id number] -------------------------------------------------------------------------------------------------------------------------------------------- Current treatment: Adjuvant letrozole  (completed Ribociclib  as part of [name] trial for 3 years)  Letrozole  toxicity: Mild hot flashes Fatigue: I would attribute this fatigue to her meniscus surgery in June as well as prediabetes and decreased physical activity.  Dr Odean informed the patient that the Ribociclib  medication that she took had nitrosamines which in preclinical studies was shown to have a potential risk of a new malignancy in approximately 1 in 8000 patients if the Ribociclib  was taken daily at a dose of 400 mg for 70 years.  Breast Cancer Completed 5-year trial with Letrozole , no new side effects reported. -Continue Letrozole  as prescribed. -Next mammogram due in June 2025.  Right Knee Surgery Recovering from surgery in June 2024, no new complications reported. -Continue current management and follow-up as needed.  General  Health Maintenance -Continue Ultram  as needed for pain. -Check hormone levels every 6 months. LH and FSH done, likely post menopausal, awaiting estradiol  -Return for follow-up in 6 months.

## 2023-06-10 NOTE — Progress Notes (Signed)
 Patient Care Team: [name], MD as PCP - General (Family Medicine) Shlomo Wilbert SAUNDERS, MD as PCP - Cardiology (Cardiology) Vanderbilt Ned, MD as Consulting Physician (General Surgery) Dewey Rush, MD as Consulting Physician (Radiation Oncology) Dillingham, Estefana RAMAN, DO as Attending Physician (Plastic Surgery) Jeanna Leatrice BROCKS, RN as Registered Nurse Morgan Rennert, MD as Consulting Physician (Hematology and Oncology)  DIAGNOSIS:  No diagnosis found.   SUMMARY OF ONCOLOGIC HISTORY: Oncology History  Malignant 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, estrogen receptor positive (HCC)  01/23/2017 Initial Diagnosis   Malignant 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, estrogen receptor positive (HCC)   01/27/2017 Cancer Staging   Staging form: Breast, AJCC 8th Edition - Clinical: Stage IIA (cT3, cN2(f), cM0, G2, ER+, PR+, HER2-) - Signed by [name], MD on 06/27/2020   03/24/2017 Genetic Testing   Patient had genetic testing due to a personal and family history of breast cancer.  The Common Hereditary Cancer Panel was ordered.  The Hereditary Gene Panel offered by [facility] includes sequencing and/or deletion duplication testing of the following 46 genes: APC, ATM, AXIN2, BARD1, BMPR1A, BRCA1, BRCA2, BRIP1, CDH1, CDKN2A (p14ARF), CDKN2A (p16INK4a), CHEK2, CTNNA1, DICER1, EPCAM (Deletion/duplication testing only), GREM1 (promoter region deletion/duplication testing only), KIT, MEN1, MLH1, MSH2, MSH3, MSH6, MUTYH, NBN, NF1, NHTL1, PALB2, PDGFRA, PMS2, POLD1, POLE, PTEN, RAD50, RAD51C, RAD51D, SDHB, SDHC, SDHD, SMAD4, SMARCA4. STK11, TP53, TSC1, TSC2, and VHL.  The following genes were evaluated for sequence changes only: SDHA and HOXB13 c.251G>A variant only.     Results: Negative, no pathogenic variants identified.  The date of this test report is 03/24/2017.     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    Malignant neoplasm of upper-inner quadrant of left breast in female, estrogen receptor positive (HCC)  07/27/2017 Initial Diagnosis   Malignant neoplasm of upper-inner quadrant of left breast in female, estrogen receptor positive (HCC)   05/31/2018 - 03/04/2021 Chemotherapy   The patient had Investigational ribociclib  (KISQALI) 200 MG tablet [name], 400 mg, Oral, Daily, 11 of 16 cycles  for chemotherapy treatment.      CHIEF COMPLIANT: Follow-up letrozole   INTERVAL HISTORY:   Discussed the use of AI scribe software for clinical note transcription with the patient, who gave verbal consent to proceed.  History of Present Illness    The patient, with a history of left modified radical mastectomy and right breast reduction, presents for an end of five year trial visit. She reports no new health issues, aside from tendonitis in her Achilles tendon which has required the use of a boot and physical therapy. She denies any changes in breathing, bowel habits, or urination. She reports mild hot flashes and fatigue, which are likely side effects of her letrozole  medication. She also reports a recent knee surgery on the same leg as the  tendonitis. She has started taking Ultram  as needed for pain.   ALLERGIES:  is allergic to dilaudid  [hydromorphone ].  MEDICATIONS:  Current Outpatient Medications  Medication  Sig Dispense Refill   acetaminophen  (TYLENOL ) 500 MG tablet Take 1,000 mg by mouth every 6 (six) hours as needed (for pain/headaches.).      Ascorbic Acid (VITAMIN C PO) Take by mouth daily.     buPROPion  (WELLBUTRIN  SR) 200 MG 12 hr tablet Take 1 tablet (200 mg total) by mouth daily. 30 tablet 0   calcium  carbonate (TUMS - DOSED IN MG ELEMENTAL CALCIUM ) 500 MG chewable tablet Chew 1 tablet by mouth 2 (two) times daily.     Calcium -Phosphorus-Vitamin D  (VITAMIN D3/CALCIUM /PHOSPHORUS PO) Take 600 mg by mouth daily.     cetirizine (ZYRTEC) 10 MG tablet Take 10 mg by mouth daily.     cyclobenzaprine  (FLEXERIL ) 10 MG tablet Take 1/2 tablet (5 mg total) by mouth 2 (two) times daily as needed. 30 tablet 0   diclofenac  (VOLTAREN ) 75 MG EC tablet Take 1 tablet (75 mg total) by mouth 2 (two) times daily as needed. 60 tablet 0   gabapentin  (NEURONTIN ) 300 MG capsule Take 1 capsule (300 mg total) by mouth 2 (two) times daily. 180 capsule 0   HYDROcodone -acetaminophen  (NORCO) 5-325 MG tablet Take 1 tablet by mouth every 6 (six) hours as needed. 20 tablet 0   ibuprofen  (ADVIL ) 800 MG tablet Take 800 mg by mouth every 8 (eight) hours as needed (pain).     letrozole  (FEMARA ) 2.5 MG tablet Take 1 tablet (2.5 mg total) by mouth daily. 90 tablet 4   loratadine  (CLARITIN ) 10 MG tablet Take 1 tablet (10 mg total) by mouth daily. (Patient taking differently: Take 10 mg by mouth daily as needed for allergies.) 90 tablet 2   losartan  (COZAAR ) 50 MG tablet Take 1 tablet (50 mg total) by mouth daily. 90 tablet 0   metFORMIN  (GLUCOPHAGE ) 500 MG tablet Take 1 tablet (500 mg total) by mouth 2 (two) times daily with a meal. 180 tablet 0   methimazole  (TAPAZOLE ) 5 MG tablet Take 1 tablet (5 mg total) by mouth daily in the afternoon. 90 tablet 3   metoprolol  succinate (TOPROL -XL) 50 MG 24 hr tablet Take 1 tablet (50 mg total) by mouth daily. (Patient taking differently: Take 25 mg by mouth daily.) 90 tablet 0   montelukast   (SINGULAIR ) 10 MG tablet Take 1 tablet (10 mg total) by mouth at bedtime. 90 tablet 0   Multiple Vitamin (MULTIVITAMIN WITH MINERALS) TABS tablet Take 1 tablet by mouth daily.      Omega-3 Fatty Acids (FISH OIL) 1000 MG CAPS Take 1,000 mg by mouth 2 (two) times daily.      omeprazole  (PRILOSEC) 40 MG capsule Take 1 capsule (40 mg total) by mouth daily. 90 capsule 1   ondansetron  (ZOFRAN ) 4 MG tablet Take 1 tablet (4 mg total) by mouth every 8 (eight) hours as needed for nausea or vomiting. 20 tablet 0   Probiotic Product (PROBIOTIC DAILY PO) Take 1 capsule by mouth 2 (two) times daily.      rosuvastatin  (CRESTOR ) 10 MG tablet Take 1 tablet (10 mg total) by mouth daily. 90 tablet 0   spironolactone  (ALDACTONE ) 25 MG tablet Take 1 tablet (25 mg total) by mouth daily. 90 tablet 0   traMADol  (ULTRAM ) 50 MG tablet Take 1 tablet (50 mg total) by mouth every 12 (twelve) hours as needed. 10 tablet 2   ZINC OXIDE PO  Take 1 tablet by mouth 2 (two) times a day.     No current facility-administered medications for this visit.   Facility-Administered Medications Ordered in Other Visits  Medication Dose Route Frequency Provider Last Rate Last Admin   sodium chloride  flush (NS) 0.9 % injection 10 mL  10 mL Intravenous PRN Magrinat, Sandria BROCKS, MD   10 mL at 03/10/17 1239    PHYSICAL EXAMINATION: ECOG PERFORMANCE STATUS: 1 - Symptomatic but completely ambulatory  Vitals:   06/09/23 0932  BP: 118/71  Pulse: 84  Resp: 18  Temp: 97.9 F (36.6 C)  SpO2: 99%   Filed Weights   06/09/23 0932  Weight: 265 lb 9.6 oz (120.5 kg)    General: No distress HEENT: no palpable LN Right breast no concerns, left breast post mastectomy and reconstruction Chest: CTA bilaterally Abdomen: Soft, non tender No LE edema.  LABORATORY DATA:  I have reviewed the data as listed    Latest Ref Rng & Units 06/09/2023    9:06 AM 06/08/2023   10:36 AM 03/31/2023   11:25 AM  CMP  Glucose 70 - 99 mg/dL 878  896  92    BUN 6 - 20 mg/dL 28  22  20    Creatinine 0.44 - 1.00 mg/dL 8.89  8.80  9.00   Sodium 135 - 145 mmol/L 132  137  135   Potassium 3.5 - 5.1 mmol/L 4.0  4.8  4.9   Chloride 98 - 111 mmol/L 98  97  99   CO2 22 - 32 mmol/L 28  24  22    Calcium  8.9 - 10.3 mg/dL 9.5  89.5  9.7   Total Protein 6.5 - 8.1 g/dL 8.5  7.9  7.1   Total Bilirubin 0.0 - 1.2 mg/dL 0.5  0.3  0.3   Alkaline Phos 38 - 126 U/L 132  163  133   AST 15 - 41 U/L 21  21  26    ALT 0 - 44 U/L 28  31  19      Lab Results  Component Value Date   WBC 7.0 06/09/2023   HGB 13.4 06/09/2023   HCT 39.9 06/09/2023   MCV 83.6 06/09/2023   PLT 247 06/09/2023   NEUTROABS 4.7 06/09/2023    ASSESSMENT & PLAN:  Malignant 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, estrogen receptor positive (HCC) 53 y.o. [place] woman status post left breast upper inner quadrant biopsy 01/14/2017 for a clinical T3 N2, stage IIA invasive ductal carcinoma, grade 1 or 2, estrogen and progesterone receptor positive, HER-2 nonamplified, with an MIB-1 of 20%.   (1) staging studies: Brain MRI, bone scan, and CT scan of the chest 02/05/2017 showed no brain lesions, no lung or liver lesions, a 4.9 cm mass in the left breast with left axillary and subpectoral adenopathy, and nonspecific bone scan tracer at L2, left scapula, and anterior ribs, with lumbar spine MRI suggested for further evaluation.             (a) lumbar spine MRI 02/17/2017 showed no normal bone lesions.  There was mild lumbar spondylosis   (2) neoadjuvant chemotherapy consisting of cyclophosphamide  and doxorubicin  in dose dense fashion 4 started 02/10/2017, completed 03/24/2017, followed by weekly carboplatin  and gemcitabine  given days 1 and 8 of each 21-day cycle starting 04/14/2017, completing the planned 4 cycles 06/26/2017   (3) is post left modified radical mastectomy on 08/27/2017 showing an mpT3 pN2 residual invasive ductal carcinoma, grade 2, with a residual cancer  burden of 3.   Margins were clear             (a) latissimus flap reconstruction with expander 11/03/2018   (4) postmastectomy radiation completed 01/01/2018             (a) capecitabine  radiosensitization 11/16/2017-01/01/2018   (5) goserelin started 09/21/2017             (a) letrozole  started 01/11/2018             (b) enrolled in [name] clinical trial, randomized to ribociclib  on 05/31/2018             (c) Bone density on 03/16/2018 was normal with T score of -0.7 in the L1-L4 spine   (6) genetics testing 03/24/2017 through the Common Hereditary Cancer Panel offered by [facility] found no deleterious mutations in APC, ATM, AXIN2, BARD1, BMPR1A, BRCA1, BRCA2, BRIP1, CDH1, CDKN2A (p14ARF), CDKN2A (p16INK4a), CHEK2, CTNNA1, DICER1, EPCAM (Deletion/duplication testing only), GREM1 (promoter region deletion/duplication testing only), KIT, MEN1, MLH1, MSH2, MSH3, MSH6, MUTYH, NBN, NF1, NHTL1, PALB2, PDGFRA, PMS2, POLD1, POLE, PTEN, RAD50, RAD51C, RAD51D, SDHB, SDHC, SDHD, SMAD4, SMARCA4. STK11, TP53, TSC1, TSC2, and VHL.  The following genes were evaluated for sequence changes only: SDHA and HOXB13 c.251G>A variant only.     (7) Right breast reduction and left breast expander placement on 03/03/2019 with latissimus flap placement             (a) left implant implant exchanged on 05/25/2019 or a [employer] Smooth Round Ultra High Profile Gel 750cc. Ref #[id number].  Serial Number [id number] -------------------------------------------------------------------------------------------------------------------------------------------- Current treatment: Adjuvant letrozole  (completed Ribociclib  as part of [name] trial for 3 years)  Letrozole  toxicity: Mild hot flashes Fatigue: I would attribute this fatigue to her meniscus surgery in June as well as prediabetes and decreased physical activity.  Dr Odean informed the patient that the Ribociclib  medication that she took had nitrosamines which in preclinical studies was shown to  have a potential risk of a new malignancy in approximately 1 in 8000 patients if the Ribociclib  was taken daily at a dose of 400 mg for 70 years.  Breast Cancer Completed 5-year trial with Letrozole , no new side effects reported. -Continue Letrozole  as prescribed. -Next mammogram due in June 2025.  Right Knee Surgery Recovering from surgery in June 2024, no new complications reported. -Continue current management and follow-up as needed.  General Health Maintenance -Continue Ultram  as needed for pain. -Check hormone levels every 6 months. LH and FSH done, likely post menopausal, awaiting estradiol  -Return for follow-up in 6 months.     No orders of the defined types were placed in this encounter.  The patient has a good understanding of the overall plan. she agrees with it. she will call with any problems that may develop before the next visit here. Total time spent: 30 mins including face to face time and time spent for planning, charting and co-ordination of care   Amber Stalls, MD 06/10/23

## 2023-06-11 ENCOUNTER — Telehealth: Payer: Self-pay | Admitting: Hematology and Oncology

## 2023-06-11 ENCOUNTER — Other Ambulatory Visit (HOSPITAL_COMMUNITY): Payer: Self-pay

## 2023-06-11 MED ORDER — LOSARTAN POTASSIUM 50 MG PO TABS
50.0000 mg | ORAL_TABLET | Freq: Every day | ORAL | 0 refills | Status: DC
  Filled 2023-07-03: qty 90, 90d supply, fill #0

## 2023-06-11 MED ORDER — SPIRONOLACTONE 25 MG PO TABS: 25.0000 mg | ORAL_TABLET | Freq: Every day | ORAL | 0 refills | Status: DC

## 2023-06-11 MED ORDER — GABAPENTIN 300 MG PO CAPS
300.0000 mg | ORAL_CAPSULE | Freq: Three times a day (TID) | ORAL | 0 refills | Status: DC
  Filled 2023-06-11: qty 270, 90d supply, fill #0

## 2023-06-11 MED ORDER — ROSUVASTATIN CALCIUM 20 MG PO TABS
20.0000 mg | ORAL_TABLET | Freq: Every day | ORAL | 0 refills | Status: DC
  Filled 2023-06-11 (×2): qty 90, 90d supply, fill #0

## 2023-06-11 NOTE — Telephone Encounter (Signed)
 Spoke with patient confirming upcoming appointment

## 2023-06-12 LAB — ESTRADIOL, ULTRA SENS: Estradiol, Sensitive: <2.5 pg/mL

## 2023-06-14 ENCOUNTER — Encounter: Payer: Self-pay | Admitting: Physical Therapy

## 2023-06-15 ENCOUNTER — Encounter: Payer: 59 | Admitting: Physical Therapy

## 2023-06-16 ENCOUNTER — Other Ambulatory Visit: Payer: Self-pay | Admitting: Hematology and Oncology

## 2023-06-16 ENCOUNTER — Telehealth: Payer: Self-pay | Admitting: *Deleted

## 2023-06-16 ENCOUNTER — Other Ambulatory Visit (HOSPITAL_COMMUNITY): Payer: Self-pay

## 2023-06-16 DIAGNOSIS — C50812 Malignant neoplasm of overlapping sites of left female breast: Secondary | ICD-10-CM

## 2023-06-16 MED ORDER — LETROZOLE 2.5 MG PO TABS
2.5000 mg | ORAL_TABLET | Freq: Every day | ORAL | 4 refills | Status: AC
  Filled 2023-06-16: qty 90, 90d supply, fill #0
  Filled 2023-08-28 (×2): qty 90, 90d supply, fill #1
  Filled 2023-10-23 – 2023-12-09 (×2): qty 90, 90d supply, fill #0
  Filled 2024-04-02: qty 90, 90d supply, fill #1

## 2023-06-16 NOTE — Telephone Encounter (Signed)
 Patient requests refill on Letrozole  sent to [facility]. Dr. Walter nurse, Val, sent refill.  Patient aware.   Informed patient of hormone level results are okay and Dr. Loretha will recheck again in 6 months at next visit. She verbalized understanding.  Cherylyn Hoard, BSN, RN, [employer] Nurse II [phone] 06/16/2023 9:46 AM

## 2023-06-19 ENCOUNTER — Other Ambulatory Visit: Payer: Self-pay | Admitting: *Deleted

## 2023-06-22 ENCOUNTER — Ambulatory Visit: Payer: 59

## 2023-06-23 ENCOUNTER — Other Ambulatory Visit (HOSPITAL_COMMUNITY): Payer: Self-pay

## 2023-06-23 ENCOUNTER — Ambulatory Visit (INDEPENDENT_AMBULATORY_CARE_PROVIDER_SITE_OTHER): Payer: 59 | Admitting: Physician Assistant

## 2023-06-23 ENCOUNTER — Ambulatory Visit: Payer: 59 | Admitting: Physical Therapy

## 2023-06-23 ENCOUNTER — Other Ambulatory Visit (INDEPENDENT_AMBULATORY_CARE_PROVIDER_SITE_OTHER): Payer: Self-pay

## 2023-06-23 ENCOUNTER — Encounter: Payer: Self-pay | Admitting: Physical Therapy

## 2023-06-23 DIAGNOSIS — M25661 Stiffness of right knee, not elsewhere classified: Secondary | ICD-10-CM | POA: Diagnosis not present

## 2023-06-23 DIAGNOSIS — M6281 Muscle weakness (generalized): Secondary | ICD-10-CM | POA: Diagnosis not present

## 2023-06-23 DIAGNOSIS — M25571 Pain in right ankle and joints of right foot: Secondary | ICD-10-CM

## 2023-06-23 DIAGNOSIS — M25561 Pain in right knee: Secondary | ICD-10-CM | POA: Diagnosis not present

## 2023-06-23 DIAGNOSIS — G8929 Other chronic pain: Secondary | ICD-10-CM | POA: Diagnosis not present

## 2023-06-23 DIAGNOSIS — R2689 Other abnormalities of gait and mobility: Secondary | ICD-10-CM | POA: Diagnosis not present

## 2023-06-23 DIAGNOSIS — R202 Paresthesia of skin: Secondary | ICD-10-CM | POA: Diagnosis not present

## 2023-06-23 DIAGNOSIS — M25671 Stiffness of right ankle, not elsewhere classified: Secondary | ICD-10-CM

## 2023-06-23 DIAGNOSIS — M542 Cervicalgia: Secondary | ICD-10-CM | POA: Diagnosis not present

## 2023-06-23 MED ORDER — PREDNISONE 5 MG (21) PO TBPK
ORAL_TABLET | ORAL | 0 refills | Status: AC
Start: 2023-06-23 — End: 2023-07-05
  Filled 2023-06-23: qty 21, 6d supply, fill #0

## 2023-06-23 NOTE — Progress Notes (Signed)
 Office Visit Note   Patient: Mackenzie [name]           Date of Birth: 1971-04-16           MRN: [id number] Visit Date: 06/23/2023              Requested by: [name], MD [address] [place],  [place] [address] PCP: [name], MD   Assessment & Plan: Visit Diagnoses:  1. Paresthesia of left upper extremity     Plan: Impression is left upper extremity radiculopathy.  Clinical exam seem somewhat consistent with carpal tunnel syndrome however she does note paresthesias that radiate to the entire upper extremity.  We have discussed starting on a steroid and sending her to physical therapy in addition to providing her with a night splint as well as ordering a nerve conduction study/EMG.  She will follow-up with us  once this has been completed.  Call if concerns or questions.  Follow-Up Instructions: Return for f/u after NCS.   Orders:  Orders Placed This Encounter  Procedures   XR Cervical Spine 2 or 3 views   Ambulatory referral to Physical Medicine Rehab   Ambulatory referral to Physical Therapy   Meds ordered this encounter  Medications   predniSONE  (STERAPRED UNI-PAK 21 TAB) 5 MG (21) TBPK tablet    Sig: Take 6 tablets (30 mg total) by mouth daily for 1 day, THEN 5 tablets (25 mg total) daily for 1 day, THEN 4 tablets (20 mg total) daily for 1 day, THEN 3 tablets (15 mg total) daily for 1 day, THEN 2 tablets (10 mg total) daily for 1 day, THEN 1 tablet (5 mg total) daily for 1 day.    Dispense:  21 tablet    Refill:  0      Procedures: No procedures performed   Clinical Data: No additional findings.   Subjective: Chief Complaint  Patient presents with   Neck - Pain, Numbness    HPI patient is a pleasant 53 year old female who comes in today with left upper extremity paresthesias.  She tells me these are 2 of the left upper arm and radiates into her index, long and ring fingers.  She feels as though the symptoms have worsened.  She denies  any pain to the neck or shoulder.  Symptoms are worse lying on her left side.  She does occasionally wake up at night with paresthesias to her hand.  She does not take medication for this.  No history of neck pathology.  No history of carpal tunnel syndrome.  She does have a history of cancer and is undergoing chemotherapy.  Unsure whether this may be related to neuropathy from this.  Review of Systems as detailed in HPI.  All others reviewed and are negative.   Objective: Vital Signs: LMP 11/28/2016   Physical Exam well-developed well-nourished female no acute distress.  Alert and oriented x 3.  Ortho Exam left shoulder exam: Unremarkable.  Full strength throughout.  Cervical spine: No spinous or paraspinous tenderness.  No pain with range of motion of the cervical spine.  She does have a positive Phalen and positive Tinel at the wrist.  She is neurovascularly intact distally.  Specialty Comments:  No specialty comments available.  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   Boil of buttock, Left 12/24/2021   Hyperthyroidism 07/22/2021   Low serum thyroid  stimulating hormone (TSH) 06/12/2021   Other fatigue 05/23/2021   Hemoglobin low 05/23/2021   Polyphagia 05/16/2020   Vitamin D  deficiency 05/16/2020   At risk for osteoporosis 05/16/2020   Prediabetes 05/01/2020   Elevated blood pressure reading 05/01/2020   At risk for impaired metabolic function 05/01/2020   Breast  asymmetry following reconstructive surgery 09/20/2019   Essential hypertension 08/17/2018   Class 3 severe obesity with serious comorbidity and body mass index (BMI) of 40.0 to 44.9 in adult [facility]) 09/17/2017   Acquired absence of left breast 09/15/2017   Malignant neoplasm of upper-inner quadrant of left breast in female, 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   Healthcare maintenance 03/25/2017   Malignant 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, estrogen receptor positive (HCC) 01/23/2017   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, left (HCC) 01/23/2017   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, left (HCC) 01/23/2017   Cellulitis of right leg    Edema, lower extremity    GERD (gastroesophageal reflux disease)    Healthcare maintenance 03/25/2017   Herpesviral infection    Hot flashes 03/2017   Hyperlipidemia 03/20/2016   Impaired fasting glucose    Morbid obesity with body mass index (BMI) of 40.0 to 49.9 (HCC) 09/17/2017   Neoplasm of cervix    Osteoarthritis of knee    Other chest pain    Other specified disorders of veins    Polyneuropathy    Postmenopausal atrophic vaginitis    Pre-diabetes 07/26/2018   Hgb A1C elevated on 07/26/2018, Gestational Diabetes 2012   Pure hypercholesterolemia 03/30/2023   Seasonal allergies 2012   seasonal allergies causes allergic rhinitis and itchy, dry eyes per pt   Stress incontinence    Tachycardia    Thyrotoxicosis    Vitamin D  deficiency 05/2015    Family History  Problem Relation Age of Onset   Other Mother        d.56 from complications of surgery to remove brain tumor   Diabetes Mother    Hypertension Mother    Stroke  Mother    Kidney disease Father    Hypertension Maternal Grandmother    Breast cancer Paternal Grandmother 32       d.60s from breast cancer. Did not have treatment.   Hypertension Other    Diabetes Other    Stroke Other     Past Surgical History:  Procedure Laterality Date   BREAST RECONSTRUCTION WITH PLACEMENT OF TISSUE EXPANDER AND FLEX HD (ACELLULAR HYDRATED DERMIS) Left 08/27/2017   Procedure: LEFT BREAST RECONSTRUCTION WITH PLACEMENT OF TISSUE EXPANDER AND FLEX HD;  Surgeon: Lowery Estefana RAMAN, DO;  Location: [facility];  Service: Plastics;  Laterality: Left;   CESAREAN SECTION     x2   KNEE ARTHROSCOPY WITH MEDIAL MENISECTOMY Right  11/12/2022   Procedure: RIGHT KNEE ARTHROSCOPY, PARTIAL MEDIAL MENISCECTOMY;  Surgeon: Jerri Kay HERO, MD;  Location: [facility];  Service: Orthopedics;  Laterality: Right;   LATISSIMUS FLAP TO BREAST Left 11/03/2018   LATISSIMUS FLAP TO BREAST Left 11/03/2018   Procedure: LATISSIMUS FLAP TO LEFT BREAST;  Surgeon: Lowery Estefana RAMAN, DO;  Location: [facility];  Service: Plastics;  Laterality: Left;   MASTECTOMY MODIFIED RADICAL Left 08/27/2017   MASTECTOMY MODIFIED RADICAL Left 08/27/2017   Procedure: LEFT MODIFIED RADICAL MASTECTOMY;  Surgeon: Vanderbilt Ned, MD;  Location: [facility];  Service: General;  Laterality: Left;   MASTOPEXY Right 03/03/2019   Procedure: RIGHT BREAST MASTOPEXY/REDUCTION;  Surgeon: Lowery Estefana RAMAN, DO;  Location: [facility];  Service: Plastics;  Laterality: Right;  3 hours, please   PORT-A-CATH REMOVAL Right 08/27/2017   Procedure: REMOVAL PORT-A-CATH RIGHT CHEST;  Surgeon: Vanderbilt Ned, MD;  Location: [facility];  Service: General;  Laterality: Right;   PORTACATH PLACEMENT Right 01/28/2017   Procedure: INSERTION PORT-A-CATH WITH ULTRASOUND;  Surgeon: Vanderbilt Ned, MD;  Location: [facility];  Service: General;  Laterality: Right;   REDUCTION MAMMAPLASTY Right    REMOVAL OF TISSUE EXPANDER AND  PLACEMENT OF IMPLANT Left 03/03/2019   Procedure: REMOVAL OF TISSUE EXPANDER AND PLACEMENT OF EXPANDER;  Surgeon: Lowery Estefana RAMAN, DO;  Location:  SURGERY CENTER;  Service: Plastics;  Laterality: Left;   REMOVAL OF TISSUE EXPANDER AND PLACEMENT OF IMPLANT Left 05/25/2019   Procedure: LEFT BREAST REMOVAL OF TISSUE EXPANDER AND PLACEMENT OF IMPLANT;  Surgeon: Lowery Estefana RAMAN, DO;  Location:  SURGERY CENTER;  Service: Plastics;  Laterality: Left;   TISSUE EXPANDER PLACEMENT Left 11/03/2018   Procedure: PLACEMENT OF TISSUE EXPANDER LEFT BREAST;  Surgeon: Lowery Estefana RAMAN, DO;  Location: [facility];  Service: Plastics;  Laterality: Left;  Total case time is 3.5 hours   TUBAL LIGATION Bilateral 01/21/2011   Social History   Occupational History   Occupation: [employer]  Tobacco Use   Smoking status: Never   Smokeless tobacco: Never  Vaping Use   Vaping status: Never Used  Substance and Sexual Activity   Alcohol use: Yes    Comment: social   Drug use: No   Sexual activity: Not on file    Comment: BTL

## 2023-06-23 NOTE — Therapy (Signed)
 OUTPATIENT PHYSICAL THERAPY LOWER EXTREMITY TREATMENT   Patient Name: Mackenzie [name] MRN: [id number] DOB:[birth year], 53 y.o., female Today's Date: 06/23/2023  END OF SESSION:  PT End of Session - 06/23/23 0845     Visit Number 2    Number of Visits 7    Date for PT Re-Evaluation 07/23/23    Authorization Type [facility] [employer], deductible met    PT Start Time 0845    PT Stop Time 0930    PT Time Calculation (min) 45 min    Activity Tolerance Patient tolerated treatment well;Patient limited by pain    Behavior During Therapy [facility] for tasks assessed/performed              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, left (HCC) 01/23/2017   Cellulitis of right leg    Edema, lower extremity    GERD (gastroesophageal reflux disease)    Healthcare maintenance 03/25/2017   Herpesviral infection    Hot flashes 03/2017   Hyperlipidemia 03/20/2016   Impaired fasting glucose    Morbid obesity with body mass index (BMI) of 40.0 to 49.9 (HCC) 09/17/2017   Neoplasm of cervix    Osteoarthritis of knee    Other chest pain    Other specified disorders of veins    Polyneuropathy    Postmenopausal atrophic vaginitis    Pre-diabetes 07/26/2018   Hgb A1C elevated on 07/26/2018, Gestational Diabetes 2012   Pure hypercholesterolemia 03/30/2023   Seasonal allergies 2012   seasonal allergies causes allergic rhinitis and itchy, dry eyes per pt   Stress incontinence    Tachycardia    Thyrotoxicosis    Vitamin D  deficiency 05/2015   Past Surgical History:  Procedure Laterality Date   BREAST RECONSTRUCTION WITH PLACEMENT OF TISSUE EXPANDER AND FLEX HD (ACELLULAR HYDRATED DERMIS) Left 08/27/2017   Procedure: LEFT  BREAST RECONSTRUCTION WITH PLACEMENT OF TISSUE EXPANDER AND FLEX HD;  Surgeon: Lowery Estefana RAMAN, DO;  Location: [facility];  Service: Plastics;  Laterality: Left;   CESAREAN SECTION     x2   KNEE ARTHROSCOPY WITH MEDIAL MENISECTOMY Right 11/12/2022   Procedure: RIGHT KNEE ARTHROSCOPY, PARTIAL MEDIAL MENISCECTOMY;  Surgeon: Jerri Kay HERO, MD;  Location: [facility];  Service: Orthopedics;  Laterality: Right;   LATISSIMUS FLAP TO BREAST Left 11/03/2018   LATISSIMUS FLAP TO BREAST Left 11/03/2018   Procedure: LATISSIMUS FLAP TO LEFT BREAST;  Surgeon: Lowery Estefana RAMAN, DO;  Location: [facility];  Service: Plastics;  Laterality: Left;   MASTECTOMY MODIFIED RADICAL Left 08/27/2017   MASTECTOMY MODIFIED RADICAL Left 08/27/2017   Procedure: LEFT MODIFIED RADICAL MASTECTOMY;  Surgeon: Vanderbilt Ned, MD;  Location: [facility];  Service: General;  Laterality: Left;   MASTOPEXY Right 03/03/2019   Procedure: RIGHT BREAST MASTOPEXY/REDUCTION;  Surgeon: Lowery Estefana RAMAN, DO;  Location: [facility];  Service: Plastics;  Laterality: Right;  3 hours, please   PORT-A-CATH REMOVAL Right 08/27/2017   Procedure: REMOVAL PORT-A-CATH RIGHT CHEST;  Surgeon: Vanderbilt Ned, MD;  Location: [facility];  Service: General;  Laterality: Right;   PORTACATH PLACEMENT Right 01/28/2017   Procedure: INSERTION PORT-A-CATH WITH ULTRASOUND;  Surgeon: Vanderbilt Ned, MD;  Location: [facility];  Service: General;  Laterality: Right;   REDUCTION MAMMAPLASTY Right    REMOVAL OF TISSUE EXPANDER AND PLACEMENT OF IMPLANT Left 03/03/2019   Procedure: REMOVAL OF TISSUE EXPANDER AND PLACEMENT OF EXPANDER;  Surgeon: Lowery Estefana RAMAN, DO;  Location: [facility];  Service: Plastics;  Laterality: Left;   REMOVAL OF TISSUE EXPANDER AND PLACEMENT OF IMPLANT Left 05/25/2019   Procedure: LEFT BREAST REMOVAL OF TISSUE EXPANDER AND PLACEMENT OF IMPLANT;  Surgeon: Lowery Estefana RAMAN, DO;  Location: [facility];  Service: Plastics;  Laterality: Left;   TISSUE EXPANDER PLACEMENT Left 11/03/2018   Procedure: PLACEMENT OF TISSUE EXPANDER LEFT BREAST;  Surgeon: Lowery Estefana RAMAN, DO;  Location: [facility];  Service: Plastics;  Laterality: Left;  Total case time is 3.5 hours   TUBAL LIGATION Bilateral 01/21/2011   Patient Active Problem List   Diagnosis Date Noted   History of breast reconstruction 05/15/2023   Pure hypercholesterolemia 03/30/2023   Acute meniscal tear, medial, right, initial encounter 11/12/2022   Plica syndrome of right knee 11/12/2022   Chondromalacia, right knee 11/12/2022   Depression 08/13/2022   BMI 40.0-44.9, adult (HCC) 08/13/2022   Obesity, Beginning BMI 08/13/2022   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   Boil of buttock, Left 12/24/2021   Hyperthyroidism 07/22/2021   Low serum thyroid  stimulating hormone (TSH) 06/12/2021   Other fatigue 05/23/2021   Hemoglobin low 05/23/2021   Polyphagia 05/16/2020   Vitamin D  deficiency 05/16/2020   At risk for osteoporosis 05/16/2020   Prediabetes 05/01/2020   Elevated blood pressure reading 05/01/2020   At risk for impaired metabolic function 05/01/2020   Breast asymmetry following reconstructive surgery 09/20/2019   Essential hypertension 08/17/2018   Class 3 severe obesity with serious comorbidity and body mass index (BMI) of 40.0 to 44.9 in adult [facility]) 09/17/2017   Acquired absence of left breast 09/15/2017   Malignant neoplasm of upper-inner quadrant of left breast in female, 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   Healthcare maintenance 03/25/2017   Malignant 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, estrogen receptor positive (HCC) 01/23/2017   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, left (HCC) 01/23/2017    PCP: [name], MD  REFERRING PROVIDER: Jerri Kay HERO, MD  REFERRING DIAG: M17.11 (ICD-10-CM) - Unilateral primary osteoarthritis, right knee    THERAPY DIAG:  Chronic pain of right knee  Pain in right ankle and joints of right foot  Other abnormalities of gait and mobility  Muscle weakness (generalized)  Stiffness of right knee, not elsewhere classified  Stiffness of right ankle, not elsewhere classified  Rationale for Evaluation and Treatment: Rehabilitation  ONSET DATE: 05/26/2023 referral to PT  SUBJECTIVE:   SUBJECTIVE STATEMENT: The exercises have been going well and seem to be helping.    PERTINENT HISTORY: Right knee arthroscopy 11/12/22, History of Lt breast cancer, GERD, hyperlipidemia,   PAIN:  NPRS scale: knee today  7/10 and in last week at rest 2-3/10, with activity 2-3/10 up to 9/10;  achilles tendon today 4/10 and in last week 0/10 at rest, moving 2-3/10 up to 6/10 Pain location: right knee more medial/anterior, and Achilles tendon Pain description: knee at times sharp / throbbing, general dull ache;  Achilles is pulling, sharp stretch Aggravating factors: walking esp stairs, getting up when stiff,  Relieving factors: meds, elevate, rest  PRECAUTIONS: NO BP LUE  WEIGHT BEARING RESTRICTIONS: No  FALLS:  Has patient fallen in last 6 months? No  LIVING ENVIRONMENT: Lives with: lives with their daughter 12yo Lives in: House Stairs: Yes: External: 3 steps; none;  sunken den single step no rail.   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   Next MD visit:  not scheduled  OBJECTIVE:  DIAGNOSTIC FINDINGS: 04/21/2023 right ankle X-ray showed Retrocalcaneal osteophyte formation  PATIENT SURVEYS:  FOTO intake:  52%  predicted:  64%  COGNITION: Overall cognitive status: WFL    SENSATION: WFL  PALPATION: Right knee tenderness to palpation medial joint line and quadriceps tendon.  Denies tenderness  to Quad muscle, Patella Tendon or other (not medial) areas of joint line.  Right ankle tenderness to palpation Achilles Tendon. Denies tenderness to Gastroc belly, Plantar Fascia or with Great Toe extension.   LOWER EXTREMITY ROM:   ROM Right eval  Hip flexion   Hip extension   Hip abduction   Hip adduction   Hip internal rotation   Hip external rotation   Knee flexion Supine A: 105* P: 112* with pain medial knee / distal quad  Knee extension Supine A: quad set -5* with pain in proximal gastroc  Ankle dorsiflexion Supine with Knee extended  A: +9* P: +10* Knee pain/none Achielles Supine with knee flexed A: 16* P: 20*  Ankle plantarflexion Supine A: 21* P: 27* Knee pain/none Achielles  Ankle inversion   Ankle eversion    (Blank rows = not tested)  LOWER EXTREMITY MMT:  MMT Right eval Left eval  Hip flexion    Hip extension    Hip abduction    Hip adduction    Hip internal rotation    Hip external rotation    Knee flexion 4/5   Knee extension 4/5   Ankle dorsiflexion    Ankle plantarflexion 3-/5   Ankle inversion    Ankle eversion     (Blank rows = not tested)  FUNCTIONAL TESTS:  18 inch chair transfer: able to arise without UE assist but weight shifts to left 5 times sit to stand: 16.53 sec Lt SLS: 1st attempt 3.96 sec 2nd attempt 11.84 sec Rt SLS: 1st attempt 7.00 sec 2nd attempt 12.89 sec  GAIT: Distance walked: 100'  Assistive device utilized: Single point cane Level of assistance: SBA Comments: antalgic with decreased stance duration RLE, right knee flexed in stance, early heel rise in terminal stance with little to no push-off noted.    TODAY'S TREATMENT                                                                          DATE: 06/09/2023:  Therapeutic Exercise: PT reviewed HEP for 1st session. Pt performed  BLEs hip flexor stretch 30 sec hold1 rep, then quad stretch 30 sec hold 2 reps Supine BLEs hamstring stretch 30 sec hold 1 rep, then  ITB stretch with strap 30 sec hold 1 rep, hip adductor 30 sec 1 rep Sidelying clam shell with green theraband 5 sec hold 10 reps Squat over chair with good form 5 reps. Bike seat 6 for 8 min level 1.  PT educated on set up and recommended 2-3 sets 10 min with timed rest and whining down rest until riding 30 min.  PT updated HEP (see below) with HO, verbal & demo cues.  Pt verbalized understanding.     TREATMENT                                                                          DATE: 06/09/2023:  Therex: PT instructed verbally in well-rounded exercise program to include flexibility / stretches, strengthening, endurance and balance.   PT reviewed both former exercise programs using Medbridge to determine what she is doing.  See Medbridge code  [email or web address] and [email or web address]  HEP instruction/performance c cues for techniques, handout provided.  Trial set performed of each for comprehension and symptom assessment.  See below for exercise list  PATIENT EDUCATION:  Education details: HEP, POC Person educated: Patient Education method: Explanation, Demonstration, Verbal cues, and Handouts Education comprehension: verbalized understanding, returned demonstration, and verbal cues required  HOME EXERCISE PROGRAM: Access Code: [email or web address] URL: [email or web address] Date: 06/09/2023 Prepared by: Grayce Spatz  Exercises - Seated Hamstring Stretch  - 1 x daily - 7 x weekly - 2-3 reps - 30 seconds hold - Gastroc Stretch on Step  - 1 x daily - 7 x weekly - 1 sets - 2-3 reps - 30 seconds hold - standing calf stretch with forefoot on small step or brick  - 1 x daily - 7 x weekly - 1 sets - 2-3 reps - 30 seconds hold - Standing Soleus Stretch on Foam 1/2 Roll  - 1 x daily - 7 x weekly - 1 sets - 2-3 reps - 30 seconds hold - Modified Thomas Stretch  - 1 x daily - 7 x weekly - 1 sets - 2-3 reps - 30 seconds hold - Supine Quadriceps Stretch with Strap on Table  - 1 x daily - 7 x weekly - 1 sets -  2-3 reps - 30 seconds hold - Hooklying Hamstring Stretch with Strap  - 1 x daily - 7 x weekly - 1 sets - 2-3 reps - 30 seconds hold - Supine ITB Stretch with Strap  - 1 x daily - 7 x weekly - 1 sets - 2-3 reps - 30 seconds hold - Hip Adductors and Hamstring Stretch with Strap  - 1 x daily - 7 x weekly - 1 sets - 2-3 reps - 30 seconds hold - Supine Bridge with Mini Swiss Ball Between Knees  - 1 x daily - 7 x weekly - 2 sets - 10 reps - 5 seconds hold - Clamshell with Resistance  - 1 x daily - 7 x weekly - 4 sets - 10 reps - Supine Active Straight Leg Raise  - 1 x daily - 7 x weekly - 4 sets - 10 reps - 2-3 seconds hold - Seated Straight Leg Raise  - 1 x daily - 7 x weekly - 3 sets - 10 reps - Squat with Chair Touch  - 1 x daily - 7 x weekly - 10 reps  ASSESSMENT: CLINICAL IMPRESSION: Patient appears to understand updated HEP.  She reports less ankle pain with initial stretches.    OBJECTIVE IMPAIRMENTS: Abnormal gait, decreased activity tolerance, decreased  balance, decreased endurance, decreased knowledge of condition, decreased knowledge of use of DME, decreased mobility, difficulty walking, decreased ROM, decreased strength, impaired flexibility, obesity, and pain.   ACTIVITY LIMITATIONS: carrying, lifting, bending, sitting, standing, squatting, sleeping, stairs, transfers, and locomotion level  PARTICIPATION LIMITATIONS: meal prep, cleaning, community activity, and occupation  PERSONAL FACTORS: Fitness, Past/current experiences, Time since onset of injury/illness/exacerbation, and 3+ comorbidities: see PMH  are also affecting patient's functional outcome.   REHAB POTENTIAL: Good  CLINICAL DECISION MAKING: Stable/uncomplicated  EVALUATION COMPLEXITY: Moderate   GOALS: Goals reviewed with patient? Yes  SHORT TERM GOALS: (target date for Short term goals 06/29/2023)   1.  Patient will demonstrate independent use of home exercise program to maintain progress from in clinic  treatments. Baseline: See objective data Goal status: Ongoing 06/23/2023  2. PROM right knee 0* - 110* Baseline: See objective data Goal status: Ongoing 06/23/2023  3. PROM right ankle Dorsiflexion +15* with knee extended Baseline: See objective data Goal status: Ongoing 06/23/2023  LONG TERM GOALS: (target dates for all long term goals  08/06/2023 )   1. Patient will demonstrate/report pain at worst less than or equal to 2/10 to facilitate minimal limitation in daily activity secondary to pain symptoms. Baseline: See objective data Goal status: Ongoing 06/23/2023   2. Patient will demonstrate independent use of home exercise program to facilitate ability to maintain/progress functional gains from skilled physical therapy services. Baseline: See objective data Goal status: Ongoing 06/23/2023   3. Patient will demonstrate FOTO outcome > or = 64 % to indicate reduced disability due to condition. Baseline: See objective data Goal status: Ongoing 06/23/2023   4.  Patient will demonstrate right knee and ankle LE MMT 5/5 throughout to faciltiate usual transfers, stairs, squatting at [facility] for daily life.  Baseline: See objective data Goal status: Ongoing 06/23/2023   5.  Patient able to negotiate stairs with single rail alternating pattern modified independent with knee pain </= 2/10. Baseline: See objective data Goal status:   Ongoing 06/23/2023    PLAN:  PT FREQUENCY:  1x/week  PT DURATION: 6 weeks  PLANNED INTERVENTIONS: 97164- PT Re-evaluation, 97110-Therapeutic exercises, 97530- Therapeutic activity, 97112- Neuromuscular re-education, 97535- Self Care, [id number]- Manual therapy, [phone]- Gait training, Patient/Family education, Balance training, Stair training, Taping, Dry Needling, Joint mobilization, DME instructions, Cryotherapy, and Moist heat  PLAN FOR NEXT SESSION: check STG, add balance to HEP,  check HEP   Grayce Spatz, PT, DPT 06/23/2023, 2:15 PM

## 2023-06-23 NOTE — Telephone Encounter (Signed)
 VOB submitted for [facility], left knee

## 2023-06-25 ENCOUNTER — Other Ambulatory Visit: Payer: Self-pay | Admitting: *Deleted

## 2023-06-26 ENCOUNTER — Telehealth: Payer: Self-pay | Admitting: Physical Medicine and Rehabilitation

## 2023-06-26 NOTE — Telephone Encounter (Signed)
Patient returned call to schedule an appointment with Dr. Alvester Morin for NCS. The number to contact patient is [phone]

## 2023-06-29 ENCOUNTER — Telehealth: Payer: Self-pay | Admitting: Physical Therapy

## 2023-06-29 ENCOUNTER — Encounter: Payer: Self-pay | Admitting: Physician Assistant

## 2023-06-29 ENCOUNTER — Other Ambulatory Visit: Payer: Self-pay | Admitting: Physician Assistant

## 2023-06-29 ENCOUNTER — Ambulatory Visit: Payer: 59 | Admitting: Physical Therapy

## 2023-06-29 ENCOUNTER — Encounter: Payer: Self-pay | Admitting: Physical Therapy

## 2023-06-29 DIAGNOSIS — E559 Vitamin D deficiency, unspecified: Secondary | ICD-10-CM | POA: Diagnosis not present

## 2023-06-29 DIAGNOSIS — R7301 Impaired fasting glucose: Secondary | ICD-10-CM | POA: Diagnosis not present

## 2023-06-29 DIAGNOSIS — M25671 Stiffness of right ankle, not elsewhere classified: Secondary | ICD-10-CM | POA: Diagnosis not present

## 2023-06-29 DIAGNOSIS — M25561 Pain in right knee: Secondary | ICD-10-CM

## 2023-06-29 DIAGNOSIS — R2689 Other abnormalities of gait and mobility: Secondary | ICD-10-CM

## 2023-06-29 DIAGNOSIS — M25661 Stiffness of right knee, not elsewhere classified: Secondary | ICD-10-CM

## 2023-06-29 DIAGNOSIS — R5383 Other fatigue: Secondary | ICD-10-CM | POA: Diagnosis not present

## 2023-06-29 DIAGNOSIS — M6281 Muscle weakness (generalized): Secondary | ICD-10-CM

## 2023-06-29 DIAGNOSIS — G8929 Other chronic pain: Secondary | ICD-10-CM

## 2023-06-29 DIAGNOSIS — M5412 Radiculopathy, cervical region: Secondary | ICD-10-CM

## 2023-06-29 DIAGNOSIS — M25571 Pain in right ankle and joints of right foot: Secondary | ICD-10-CM

## 2023-06-29 DIAGNOSIS — E785 Hyperlipidemia, unspecified: Secondary | ICD-10-CM | POA: Diagnosis not present

## 2023-06-29 DIAGNOSIS — Z0001 Encounter for general adult medical examination with abnormal findings: Secondary | ICD-10-CM | POA: Diagnosis not present

## 2023-06-29 NOTE — Telephone Encounter (Signed)
Sounds good.  Just put in referral

## 2023-06-29 NOTE — Therapy (Signed)
OUTPATIENT PHYSICAL THERAPY LOWER EXTREMITY TREATMENT   Patient Name: Mackenzie [name] MRN: [id number] DOB:[birth year], 53 y.o., female Today's Date: 06/29/2023  END OF SESSION:  PT End of Session - 06/29/23 0850     Visit Number 3    Number of Visits 7    Date for PT Re-Evaluation 07/23/23    Authorization Type [facility] [employer], deductible met    PT Start Time 0847    PT Stop Time 0926    PT Time Calculation (min) 39 min    Activity Tolerance Patient tolerated treatment well;Patient limited by pain    Behavior During Therapy [facility] for tasks assessed/performed               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, left (HCC) 01/23/2017   Cellulitis of right leg    Edema, lower extremity    GERD (gastroesophageal reflux disease)    Healthcare maintenance 03/25/2017   Herpesviral infection    Hot flashes 03/2017   Hyperlipidemia 03/20/2016   Impaired fasting glucose    Morbid obesity with body mass index (BMI) of 40.0 to 49.9 (HCC) 09/17/2017   Neoplasm of cervix    Osteoarthritis of knee    Other chest pain    Other specified disorders of veins    Polyneuropathy    Postmenopausal atrophic vaginitis    Pre-diabetes 07/26/2018   Hgb A1C elevated on 07/26/2018, Gestational Diabetes 2012   Pure hypercholesterolemia 03/30/2023   Seasonal allergies 2012   seasonal allergies causes allergic rhinitis and itchy, dry eyes per pt   Stress incontinence    Tachycardia    Thyrotoxicosis    Vitamin D deficiency 05/2015   Past Surgical History:  Procedure Laterality Date   BREAST RECONSTRUCTION WITH PLACEMENT OF TISSUE EXPANDER AND FLEX HD (ACELLULAR HYDRATED DERMIS) Left 08/27/2017   Procedure: LEFT  BREAST RECONSTRUCTION WITH PLACEMENT OF TISSUE EXPANDER AND FLEX HD;  Surgeon: Peggye Form, DO;  Location: [facility];  Service: Plastics;  Laterality: Left;   CESAREAN SECTION     x2   KNEE ARTHROSCOPY WITH MEDIAL MENISECTOMY Right 11/12/2022   Procedure: RIGHT KNEE ARTHROSCOPY, PARTIAL MEDIAL MENISCECTOMY;  Surgeon: Tarry Kos, MD;  Location: [facility];  Service: Orthopedics;  Laterality: Right;   LATISSIMUS FLAP TO BREAST Left 11/03/2018   LATISSIMUS FLAP TO BREAST Left 11/03/2018   Procedure: LATISSIMUS FLAP TO LEFT BREAST;  Surgeon: Peggye Form, DO;  Location: [facility];  Service: Plastics;  Laterality: Left;   MASTECTOMY MODIFIED RADICAL Left 08/27/2017   MASTECTOMY MODIFIED RADICAL Left 08/27/2017   Procedure: LEFT MODIFIED RADICAL MASTECTOMY;  Surgeon: Harriette Bouillon, MD;  Location: [facility];  Service: General;  Laterality: Left;   MASTOPEXY Right 03/03/2019   Procedure: RIGHT BREAST MASTOPEXY/REDUCTION;  Surgeon: Peggye Form, DO;  Location: [facility];  Service: Plastics;  Laterality: Right;  3 hours, please   PORT-A-CATH REMOVAL Right 08/27/2017   Procedure: REMOVAL PORT-A-CATH RIGHT CHEST;  Surgeon: Harriette Bouillon, MD;  Location: [facility];  Service: General;  Laterality: Right;   PORTACATH PLACEMENT Right 01/28/2017   Procedure: INSERTION PORT-A-CATH WITH ULTRASOUND;  Surgeon: Harriette Bouillon, MD;  Location: [facility];  Service: General;  Laterality: Right;   REDUCTION MAMMAPLASTY Right    REMOVAL OF TISSUE EXPANDER AND PLACEMENT OF IMPLANT Left 03/03/2019   Procedure: REMOVAL OF TISSUE EXPANDER AND PLACEMENT OF EXPANDER;  Surgeon: Peggye Form, DO;  Location: [facility];  Service: Plastics;  Laterality: Left;   REMOVAL OF TISSUE EXPANDER AND PLACEMENT OF IMPLANT Left 05/25/2019   Procedure: LEFT BREAST REMOVAL OF TISSUE EXPANDER AND PLACEMENT OF IMPLANT;  Surgeon: Peggye Form, DO;  Location: [facility];  Service: Plastics;  Laterality: Left;   TISSUE EXPANDER PLACEMENT Left 11/03/2018   Procedure: PLACEMENT OF TISSUE EXPANDER LEFT BREAST;  Surgeon: Peggye Form, DO;  Location: [facility];  Service: Plastics;  Laterality: Left;  Total case time is 3.5 hours   TUBAL LIGATION Bilateral 01/21/2011   Patient Active Problem List   Diagnosis Date Noted   History of breast reconstruction 05/15/2023   Pure hypercholesterolemia 03/30/2023   Acute meniscal tear, medial, right, initial encounter 11/12/2022   Plica syndrome of right knee 11/12/2022   Chondromalacia, right knee 11/12/2022   Depression 08/13/2022   BMI 40.0-44.9, adult (HCC) 08/13/2022   Obesity, Beginning BMI 08/13/2022   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   Boil of buttock, Left 12/24/2021   Hyperthyroidism 07/22/2021   Low serum thyroid stimulating hormone (TSH) 06/12/2021   Other fatigue 05/23/2021   Hemoglobin low 05/23/2021   Polyphagia 05/16/2020   Vitamin D deficiency 05/16/2020   At risk for osteoporosis 05/16/2020   Prediabetes 05/01/2020   Elevated blood pressure reading 05/01/2020   At risk for impaired metabolic function 05/01/2020   Breast asymmetry following reconstructive surgery 09/20/2019   Essential hypertension 08/17/2018   Class 3 severe obesity with serious comorbidity and body mass index (BMI) of 40.0 to 44.9 in adult [facility]) 09/17/2017   Acquired absence of left breast 09/15/2017   Malignant neoplasm of upper-inner quadrant of left breast in female, 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   Healthcare maintenance 03/25/2017   Malignant 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, estrogen receptor positive (HCC) 01/23/2017   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, left (HCC) 01/23/2017    PCP: Dois Davenport, MD  REFERRING PROVIDER: Tarry Kos, MD  REFERRING DIAG: M17.11 (ICD-10-CM) - Unilateral primary osteoarthritis, right knee    THERAPY DIAG:  Chronic pain of right knee  Pain in right ankle and joints of right foot  Muscle weakness (generalized)  Other abnormalities of gait and mobility  Stiffness of right knee, not elsewhere classified  Stiffness of right ankle, not elsewhere classified  Rationale for Evaluation and Treatment: Rehabilitation  ONSET DATE: 05/26/2023 referral to PT  SUBJECTIVE:   SUBJECTIVE STATEMENT: She saw PA with LUE parathesia and recommended therapy.  She reports that her right knee gave out when grocery shopping. She caught herself on cart.  Her knee felt okay afterwards.   PERTINENT HISTORY: Right knee arthroscopy 11/12/22, History of Lt breast cancer, GERD, hyperlipidemia,   PAIN:  NPRS scale: knee today 1-2/10 and in last week at rest 0/10, with activity 5-6/10 up to 6/10;  achilles tendon today 5/10 and in last week 0/10 at rest, moving 5-6/10 up to 6-7/10 Pain location: right knee more medial/anterior, and Achilles tendon Pain description: knee at times sharp / throbbing, general dull ache;  Achilles is pulling, sharp stretch Aggravating factors: walking esp stairs, getting up when stiff,  Relieving factors: meds, elevate, rest  PRECAUTIONS: NO BP LUE  WEIGHT BEARING RESTRICTIONS: No  FALLS:  Has patient fallen in last 6 months? No  LIVING ENVIRONMENT: Lives with: lives with their daughter 12yo Lives in: House Stairs: Yes: External: 3 steps; none;  sunken den single step no rail.   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   Next MD visit:  not scheduled  OBJECTIVE:  DIAGNOSTIC FINDINGS: 04/21/2023 right ankle X-ray showed Retrocalcaneal osteophyte formation  PATIENT SURVEYS:  FOTO intake:  52%  predicted:  64%  COGNITION: Overall cognitive status:  WFL    SENSATION: WFL  PALPATION: Right knee tenderness to palpation medial joint line and quadriceps tendon.  Denies tenderness to Quad muscle, Patella Tendon or other (not medial) areas of joint line.  Right ankle tenderness to palpation Achilles Tendon. Denies tenderness to Gastroc belly, Plantar Fascia or with Great Toe extension.   LOWER EXTREMITY ROM:   ROM Right eval Right 06/29/23  Hip flexion    Hip extension    Hip abduction    Hip adduction    Hip internal rotation    Hip external rotation    Knee flexion Supine A: 105* P: 112* with pain medial knee / distal quad Supine P: 113*  Knee extension Supine A: quad set -5* with pain in proximal gastroc Supine P: 0*  Ankle dorsiflexion Supine with Knee extended  A: +9* P: +10* Knee pain/none Achielles Supine with knee flexed A: 16* P: 20* Standing DF with knee ext P: +19*  Ankle plantarflexion Supine A: 21* P: 27* Knee pain/none Achielles   Ankle inversion    Ankle eversion     (Blank rows = not tested)  LOWER EXTREMITY MMT:  MMT Right eval Left eval  Hip flexion    Hip extension    Hip abduction    Hip adduction    Hip internal rotation    Hip external rotation    Knee flexion 4/5   Knee extension 4/5   Ankle dorsiflexion    Ankle plantarflexion 3-/5   Ankle inversion    Ankle eversion     (Blank rows = not tested)  FUNCTIONAL TESTS:  18 inch chair transfer: able to arise without UE assist but weight shifts to left 5 times sit to stand: 16.53 sec Lt SLS: 1st attempt 3.96 sec 2nd attempt 11.84 sec Rt SLS: 1st attempt 7.00 sec 2nd attempt 12.89 sec  GAIT: Distance walked: 100'  Assistive device utilized: Single point cane Level of assistance: SBA Comments: antalgic with decreased stance duration RLE, right knee flexed in stance, early heel rise in terminal stance with little to no push-off noted.    TODAY'S TREATMENT                                                                           DATE: 06/29/2023:  Therapeutic Exercise: Bike seat 6 for 8 min level 3. 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   Gastroc stretch RLE on  step heel depression 30 sec hold 3 reps with 10 heel raises 2 sets bw stretches  Leg press BLEs 100# 15 reps RLE only 50# 10 reps PT reviewed need for HEP to include flexibility, strength, endurance (muscle) and balance.  Pt verbalized understanding.   Neuromuscular Reeducation: Standing on foam alternating LEs 10 reps ea flexion, abduction & extension. Standing on foam alternating bw DF & PF 10 reps. Braiding 3 laps first with UE support & 2 laps without UE support     TREATMENT                                                                          DATE: 06/23/2023:  Therapeutic Exercise: PT reviewed HEP for 1st session. Pt performed  BLEs hip flexor stretch 30 sec hold1 rep, then quad stretch 30 sec hold 2 reps Supine BLEs hamstring stretch 30 sec hold 1 rep, then ITB stretch with strap 30 sec hold 1 rep, hip adductor 30 sec 1 rep Sidelying clam shell with green theraband 5 sec hold 10 reps Squat over chair with good form 5 reps. Bike seat 6 for 8 min level 1.  PT educated on set up and recommended 2-3 sets 10 min with timed rest and whining down rest until riding 30 min.  PT updated HEP (see below) with HO, verbal & demo cues.  Pt verbalized understanding.     TREATMENT                                                                          DATE: 06/09/2023:  Therex: PT instructed verbally in well-rounded exercise program to include flexibility / stretches, strengthening, endurance and balance.   PT reviewed both former exercise programs using Medbridge to determine what she is doing.  See Medbridge code  [email or web address] and [email or web address]  HEP instruction/performance c cues for techniques, handout provided.  Trial set performed of each for comprehension and symptom assessment.  See below for  exercise list  PATIENT EDUCATION:  Access Code: [email or web address] URL: [email or web address] Date: 06/29/2023 Prepared by: Vladimir Faster  Exercises - Seated Hamstring Stretch  - 1 x daily - 7 x weekly - 2-3 reps - 30 seconds hold - Gastroc Stretch on Step  - 1 x daily - 7 x weekly - 1 sets - 2-3 reps - 30 seconds hold - standing calf stretch with forefoot on small step or brick  - 1 x daily - 7 x weekly - 1 sets - 2-3 reps - 30 seconds hold - Standing Soleus Stretch on Foam 1/2 Roll  - 1 x daily - 7 x weekly - 1 sets - 2-3 reps - 30 seconds hold - Modified Thomas Stretch  - 1 x daily - 7 x weekly - 1 sets - 2-3 reps - 30 seconds hold - Supine Quadriceps Stretch with Strap on Table  - 1 x daily - 7 x weekly - 1 sets - 2-3 reps - 30 seconds hold -  Hooklying Hamstring Stretch with Strap  - 1 x daily - 7 x weekly - 1 sets - 2-3 reps - 30 seconds hold - Supine ITB Stretch with Strap  - 1 x daily - 7 x weekly - 1 sets - 2-3 reps - 30 seconds hold - Hip Adductors and Hamstring Stretch with Strap  - 1 x daily - 7 x weekly - 1 sets - 2-3 reps - 30 seconds hold - Supine Bridge with Mini Swiss Ball Between Knees  - 1 x daily - 7 x weekly - 2 sets - 10 reps - 5 seconds hold - Clamshell with Resistance  - 1 x daily - 7 x weekly - 4 sets - 10 reps - Supine Active Straight Leg Raise  - 1 x daily - 7 x weekly - 4 sets - 10 reps - 2-3 seconds hold - Seated Straight Leg Raise  - 1 x daily - 7 x weekly - 3 sets - 10 reps - Squat with Chair Touch  - 1 x daily - 7 x weekly - 10 reps - standing alternating legs hip abduction on foam pad  - 1 x daily - 5 x weekly - 1 sets - 10 reps - 5 seconds hold - standing alternating legs hip extension on foam pad  - 1 x daily - 5 x weekly - 1 sets - 10 reps - 5 seconds hold - standing alternating legs hip flexion on foam pad  - 1 x daily - 5 x weekly - 1 sets - 10 reps - 5 seconds hold - Carioca with Counter Support  - 1 x daily - 7 x weekly - 1 sets - 15  reps  ASSESSMENT: CLINICAL IMPRESSION: Patient met all STGs set for initial 30 days.  Her pain is higher in knee and ankle today which may be related to increased sedentary for 1 week and weather changes.  Patient continues to benefit from skilled PT.   OBJECTIVE IMPAIRMENTS: Abnormal gait, decreased activity tolerance, decreased balance, decreased endurance, decreased knowledge of condition, decreased knowledge of use of DME, decreased mobility, difficulty walking, decreased ROM, decreased strength, impaired flexibility, obesity, and pain.   ACTIVITY LIMITATIONS: carrying, lifting, bending, sitting, standing, squatting, sleeping, stairs, transfers, and locomotion level  PARTICIPATION LIMITATIONS: meal prep, cleaning, community activity, and occupation  PERSONAL FACTORS: Fitness, Past/current experiences, Time since onset of injury/illness/exacerbation, and 3+ comorbidities: see PMH  are also affecting patient's functional outcome.   REHAB POTENTIAL: Good  CLINICAL DECISION MAKING: Stable/uncomplicated  EVALUATION COMPLEXITY: Moderate   GOALS: Goals reviewed with patient? Yes  SHORT TERM GOALS: (target date for Short term goals 06/29/2023)   1.  Patient will demonstrate independent use of home exercise program to maintain progress from in clinic treatments. Baseline: See objective data Goal status: MET 06/29/2023  2. PROM right knee 0* - 110* Baseline: See objective data Goal status: MET 06/29/2023  3. PROM right ankle Dorsiflexion +15* with knee extended Baseline: See objective data Goal status: MET 06/29/2023  LONG TERM GOALS: (target dates for all long term goals  08/06/2023 )   1. Patient will demonstrate/report pain at worst less than or equal to 2/10 to facilitate minimal limitation in daily activity secondary to pain symptoms. Baseline: See objective data Goal status: Ongoing 06/23/2023   2. Patient will demonstrate independent use of home exercise program to facilitate  ability to maintain/progress functional gains from skilled physical therapy services. Baseline: See objective data Goal status: Ongoing 06/23/2023   3. Patient will  demonstrate FOTO outcome > or = 64 % to indicate reduced disability due to condition. Baseline: See objective data Goal status: Ongoing 06/23/2023   4.  Patient will demonstrate right knee and ankle LE MMT 5/5 throughout to faciltiate usual transfers, stairs, squatting at [facility] for daily life.  Baseline: See objective data Goal status: Ongoing 06/23/2023   5.  Patient able to negotiate stairs with single rail alternating pattern modified independent with knee pain </= 2/10. Baseline: See objective data Goal status:   Ongoing 06/23/2023    PLAN:  PT FREQUENCY:  1x/week  PT DURATION: 6 weeks  PLANNED INTERVENTIONS: 97164- PT Re-evaluation, 97110-Therapeutic exercises, 97530- Therapeutic activity, 97112- Neuromuscular re-education, 97535- Self Care, [id number]- Manual therapy, [phone]- Gait training, Patient/Family education, Balance training, Stair training, Taping, Dry Needling, Joint mobilization, DME instructions, Cryotherapy, and Moist heat  PLAN FOR NEXT SESSION:  check HEP progression,  work on stairs, work towards [employer], PT, DPT 06/29/2023, 9:36 AM

## 2023-06-29 NOTE — Progress Notes (Signed)
Put in referral for OT

## 2023-06-29 NOTE — Telephone Encounter (Signed)
Mackenzie [name], [name] is already seeing PT for issues with LEs.  Adding her UE care into the plan would decrease time to address all issues.  Can you please switch this UE referral to OT here at [facility]? [name]

## 2023-06-30 ENCOUNTER — Telehealth: Payer: Self-pay | Admitting: *Deleted

## 2023-06-30 NOTE — Telephone Encounter (Addendum)
[name] 30 day Safety follow up-  LVM requesting patient return call to research nurse to conduct the above visit.  Patient states no serious adverse events since last study visit. She continues to take Letrozole daily as prescribed.. Patient understands next visit is scheduled 12/14/23 and to call research nurse if any questions or concerns prior to that visit. Thanked patient for her ongoing participation in this study.  [name] Reamer, BSN, RN, [employer] Research Nurse II [phone] 06/30/2023 4:11 PM

## 2023-07-03 ENCOUNTER — Other Ambulatory Visit: Payer: Self-pay

## 2023-07-03 ENCOUNTER — Other Ambulatory Visit (HOSPITAL_COMMUNITY): Payer: Self-pay

## 2023-07-03 ENCOUNTER — Ambulatory Visit (INDEPENDENT_AMBULATORY_CARE_PROVIDER_SITE_OTHER): Payer: 59 | Admitting: Physical Medicine and Rehabilitation

## 2023-07-03 DIAGNOSIS — R202 Paresthesia of skin: Secondary | ICD-10-CM

## 2023-07-03 DIAGNOSIS — M542 Cervicalgia: Secondary | ICD-10-CM | POA: Diagnosis not present

## 2023-07-03 MED ORDER — ROSUVASTATIN CALCIUM 20 MG PO TABS
20.0000 mg | ORAL_TABLET | Freq: Every day | ORAL | 0 refills | Status: DC
  Filled 2023-07-03 – 2023-09-07 (×2): qty 90, 90d supply, fill #0

## 2023-07-03 MED ORDER — SPIRONOLACTONE 25 MG PO TABS
25.0000 mg | ORAL_TABLET | Freq: Every day | ORAL | 0 refills | Status: DC
  Filled 2023-07-03 – 2023-08-28 (×2): qty 90, 90d supply, fill #0

## 2023-07-03 MED ORDER — METOPROLOL SUCCINATE ER 50 MG PO TB24
25.0000 mg | ORAL_TABLET | Freq: Every day | ORAL | 0 refills | Status: DC
  Filled 2023-07-03 – 2023-10-23 (×2): qty 45, 90d supply, fill #0

## 2023-07-03 MED ORDER — OMEPRAZOLE 40 MG PO CPDR
40.0000 mg | DELAYED_RELEASE_CAPSULE | Freq: Every day | ORAL | 0 refills | Status: DC
  Filled 2023-07-03 – 2023-10-23 (×2): qty 90, 90d supply, fill #0

## 2023-07-03 MED ORDER — GABAPENTIN 300 MG PO CAPS
300.0000 mg | ORAL_CAPSULE | Freq: Three times a day (TID) | ORAL | 0 refills | Status: DC
  Filled 2023-07-03: qty 270, 90d supply, fill #0

## 2023-07-03 MED ORDER — ROSUVASTATIN CALCIUM 20 MG PO TABS
20.0000 mg | ORAL_TABLET | Freq: Every day | ORAL | 0 refills | Status: DC
  Filled 2023-07-03: qty 90, 90d supply, fill #0

## 2023-07-03 MED ORDER — LOSARTAN POTASSIUM 50 MG PO TABS
50.0000 mg | ORAL_TABLET | Freq: Every day | ORAL | 0 refills | Status: DC
  Filled 2023-07-03 – 2023-10-23 (×4): qty 90, 90d supply, fill #0

## 2023-07-03 MED ORDER — MONTELUKAST SODIUM 10 MG PO TABS
10.0000 mg | ORAL_TABLET | Freq: Every day | ORAL | 0 refills | Status: DC
  Filled 2023-07-03 – 2023-09-07 (×2): qty 90, 90d supply, fill #0

## 2023-07-03 NOTE — Progress Notes (Signed)
Functional Pain Scale - descriptive words and definitions  Moderate (4)   Constantly aware of pain, can complete ADLs with modification/sleep marginally affected at times/passive distraction is of no use, but active distraction gives some relief. Moderate range order  Average Pain 4  Numbness, tingling and discomfort in fingers to arm. She has difficultly grasping and drops things frequently. Right handed

## 2023-07-06 ENCOUNTER — Other Ambulatory Visit (HOSPITAL_COMMUNITY): Payer: Self-pay

## 2023-07-06 ENCOUNTER — Other Ambulatory Visit: Payer: Self-pay

## 2023-07-06 ENCOUNTER — Encounter (INDEPENDENT_AMBULATORY_CARE_PROVIDER_SITE_OTHER): Payer: Self-pay | Admitting: Adult Health

## 2023-07-06 ENCOUNTER — Ambulatory Visit (INDEPENDENT_AMBULATORY_CARE_PROVIDER_SITE_OTHER): Payer: 59 | Admitting: Adult Health

## 2023-07-06 VITALS — BP 119/77 | HR 89 | Temp 97.8°F | Ht 65.0 in | Wt 261.0 lb

## 2023-07-06 DIAGNOSIS — F5089 Other specified eating disorder: Secondary | ICD-10-CM | POA: Diagnosis not present

## 2023-07-06 DIAGNOSIS — R7303 Prediabetes: Secondary | ICD-10-CM

## 2023-07-06 DIAGNOSIS — E7800 Pure hypercholesterolemia, unspecified: Secondary | ICD-10-CM

## 2023-07-06 DIAGNOSIS — E559 Vitamin D deficiency, unspecified: Secondary | ICD-10-CM

## 2023-07-06 DIAGNOSIS — I10 Essential (primary) hypertension: Secondary | ICD-10-CM | POA: Diagnosis not present

## 2023-07-06 DIAGNOSIS — Z6841 Body Mass Index (BMI) 40.0 and over, adult: Secondary | ICD-10-CM | POA: Diagnosis not present

## 2023-07-06 DIAGNOSIS — M25561 Pain in right knee: Secondary | ICD-10-CM | POA: Insufficient documentation

## 2023-07-06 DIAGNOSIS — E669 Obesity, unspecified: Secondary | ICD-10-CM

## 2023-07-06 DIAGNOSIS — F3289 Other specified depressive episodes: Secondary | ICD-10-CM

## 2023-07-06 MED ORDER — BUPROPION HCL ER (SR) 200 MG PO TB12
200.0000 mg | ORAL_TABLET | Freq: Every day | ORAL | 0 refills | Status: DC
  Filled 2023-07-06: qty 90, 90d supply, fill #0

## 2023-07-06 MED ORDER — WEGOVY 0.25 MG/0.5ML ~~LOC~~ SOAJ
0.2500 mg | SUBCUTANEOUS | 0 refills | Status: DC
  Filled 2023-07-06: qty 2, 28d supply, fill #0

## 2023-07-06 NOTE — Progress Notes (Signed)
WEIGHT SUMMARY AND BIOMETRICS  Vitals Temp: 97.8 F (36.6 C) BP: 119/77 Pulse Rate: 89 SpO2: 98 %   Anthropometric Measurements Height: 5\' 5"  (1.651 m) Weight: 261 lb (118.4 kg) BMI (Calculated): 43.43 Weight at Last Visit: 258 lb Weight Lost Since Last Visit: 0 Weight Gained Since Last Visit: 3 lb Starting Weight: 267 lb Total Weight Loss (lbs): 6 lb (2.722 kg)   Body Composition  Body Fat %: 48.8 % Fat Mass (lbs): 127.8 lbs Muscle Mass (lbs): 127.2 lbs Total Body Water (lbs): 85.2 lbs Visceral Fat Rating : 16   Other Clinical Data Fasting: yes Labs: no Today's Visit #: 72 Starting Date: 08/09/19    Chief Complaint:   OBESITY Mackenzie [name] is here to discuss her progress with her obesity treatment plan. She is on the the Category 3 Plan and states she is following her eating plan approximately 65 % of the time.  She states she is exercising Yoga and Stationary Bike 20-25 minutes 3 times per week.   Interim History:  2025 Health Goals: 1) Resume steady weight loss 2) Increase daily activity, ie: she is member of [employer]  She has been experiencing worsening LUE numbness/tingling. She recently completed a conduction study and has been wearing a splint at night.  Her R knee continues to be source of discomfort and limits her ability to walk for long periods  Subjective:   1. Essential hypertension Discussed Labs 06/08/2023 CMP: Hypokalemia- she denies acute cardiac sx's Serum Creat improved, GFR >60 Alk Phose elevated- denies GI upset  2. Pre-diabetes Discussed Labs  Latest Reference Range & Units 06/08/23 10:36  Glucose 70 - 99 mg/dL 478 (H)  Hemoglobin G9F 4.8 - 5.6 % 6.1 (H)  Est. average glucose Bld gHb Est-mCnc mg/dL 621  INSULIN 2.6 - 30.8 uIU/mL 24.1  (H): Data is abnormally high  A1c, CBG, and Insulin levels all elevated She is currently on Metformin 500mg  BID wit meals- denies GI upset  3. Vitamin D deficiency Discussed Labs  Latest  Reference Range & Units 06/08/23 10:36  Vitamin D, 25-Hydroxy 30.0 - 100.0 ng/mL 65.8   Level stable  4. Pure hypercholesterolemia Discussed Labs Lipid Panel     Component Value Date/Time   CHOL 197 06/08/2023 1036   TRIG 93 06/08/2023 1036   HDL 56 06/08/2023 1036   CHOLHDL 3.0 03/31/2023 1125   CHOLHDL 3.4 06/27/2020 0800   VLDL 7 06/27/2020 0800   LDLCALC 124 (H) 06/08/2023 1036   LABVLDL 17 06/08/2023 1036   The 10-year ASCVD risk score (Arnett DK, et al., 2019) is: 3.4%   Values used to calculate the score:     Age: 53 years     Sex: Female     Is Non-Hispanic African American: Yes     Diabetic: No     Tobacco smoker: No     Systolic Blood Pressure: 119 mmHg     Is BP treated: Yes     HDL Cholesterol: 56 mg/dL     Total Cholesterol: 197 mg/dL   ASCVD risk start is accepetable  5. Emotional Eating Behavior She reports stable mood, denies SI/HI She is on daily Wellbutrin SR 200mg  BP stable at OV She is abstinent from sexually activity  Assessment/Plan:   1. Essential hypertension Continue Cat 3 Meal Plan and increase daily activity Limit Nephrotoxic substances Monitor Labs  2. Pre-diabetes (Primary) Continue Cat 3 Meal Plan and increase daily activity  3. Vitamin D deficiency Continue current Vit D  supplementation  4. Pure hypercholesterolemia Continue Cat 3 Meal Plan and increase daily activity  5. Emotional Eating Behavior Refill - buPROPion (WELLBUTRIN SR) 200 MG 12 hr tablet; Take 1 tablet (200 mg total) by mouth daily.  Dispense: 90 tablet; Refill: 0  6. Obesity, Current BMI 43.6 Start Semaglutide-Weight Management (WEGOVY) 0.25 MG/0.5ML SOAJ Inject 0.25 mg into the skin once a week. Dispense: 2 mL, Refills: 0 of 0 remaining   Mackenzie [name] is currently in the action stage of change. As such, her goal is to continue with weight loss efforts. She has agreed to the Category 3 Plan.   Exercise goals: All adults should avoid inactivity. Some physical  activity is better than none, and adults who participate in any amount of physical activity gain some health benefits. Adults should also include muscle-strengthening activities that involve all major muscle groups on 2 or more days a week.  Behavioral modification strategies: increasing lean protein intake, decreasing simple carbohydrates, increasing vegetables, increasing water intake, no skipping meals, meal planning and cooking strategies, keeping healthy foods in the home, ways to avoid boredom eating, and planning for success.  Mackenzie [name] has agreed to follow-up with our clinic in 4 weeks. She was informed of the importance of frequent follow-up visits to maximize her success with intensive lifestyle modifications for her multiple health conditions.   Objective:   Blood pressure 119/77, pulse 89, temperature 97.8 F (36.6 C), height 5\' 5"  (1.651 m), weight 261 lb (118.4 kg), last menstrual period 11/28/2016, SpO2 98%, unknown if currently breastfeeding. Body mass index is 43.43 kg/m.  General: Cooperative, alert, well developed, in no acute distress. HEENT: Conjunctivae and lids unremarkable. Cardiovascular: Regular rhythm.  Lungs: Normal work of breathing. Neurologic: No focal deficits.   Lab Results  Component Value Date   CREATININE 1.10 (H) 06/09/2023   BUN 28 (H) 06/09/2023   NA 132 (L) 06/09/2023   K 4.0 06/09/2023   CL 98 06/09/2023   CO2 28 06/09/2023   Lab Results  Component Value Date   ALT 28 06/09/2023   AST 21 06/09/2023   GGT 49 06/09/2023   ALKPHOS 132 (H) 06/09/2023   BILITOT 0.5 06/09/2023   Lab Results  Component Value Date   HGBA1C 6.1 (H) 06/08/2023   HGBA1C 6.0 (H) 01/19/2023   HGBA1C 5.8 (H) 09/03/2022   HGBA1C 5.4 01/29/2022   HGBA1C 5.4 12/19/2021   Lab Results  Component Value Date   INSULIN 24.1 06/08/2023   INSULIN 15.4 01/19/2023   INSULIN 13.1 09/03/2022   INSULIN 21.3 01/29/2022   INSULIN 18.2 01/07/2022   Lab Results  Component  Value Date   TSH 3.100 06/08/2023   Lab Results  Component Value Date   CHOL 197 06/08/2023   HDL 56 06/08/2023   LDLCALC 124 (H) 06/08/2023   TRIG 93 06/08/2023   CHOLHDL 3.0 03/31/2023   Lab Results  Component Value Date   VD25OH 65.8 06/08/2023   VD25OH 76.5 01/19/2023   VD25OH 81.2 09/03/2022   Lab Results  Component Value Date   WBC 7.0 06/09/2023   HGB 13.4 06/09/2023   HCT 39.9 06/09/2023   MCV 83.6 06/09/2023   PLT 247 06/09/2023   No results found for: "IRON", "TIBC", "FERRITIN"  Attestation Statements:   Reviewed by clinician on day of visit: allergies, medications, problem list, medical history, surgical history, family history, social history, and previous encounter notes.  I have reviewed the above documentation for accuracy and completeness, and I agree with the above. -  Gaylin Osoria d. Javares Kaufhold, NP-C

## 2023-07-07 ENCOUNTER — Encounter: Payer: Self-pay | Admitting: Physical Therapy

## 2023-07-07 ENCOUNTER — Ambulatory Visit: Payer: 59 | Admitting: Physical Therapy

## 2023-07-07 DIAGNOSIS — M6281 Muscle weakness (generalized): Secondary | ICD-10-CM

## 2023-07-07 DIAGNOSIS — M25571 Pain in right ankle and joints of right foot: Secondary | ICD-10-CM

## 2023-07-07 DIAGNOSIS — M25561 Pain in right knee: Secondary | ICD-10-CM

## 2023-07-07 DIAGNOSIS — M25671 Stiffness of right ankle, not elsewhere classified: Secondary | ICD-10-CM

## 2023-07-07 DIAGNOSIS — G8929 Other chronic pain: Secondary | ICD-10-CM | POA: Diagnosis not present

## 2023-07-07 DIAGNOSIS — M25661 Stiffness of right knee, not elsewhere classified: Secondary | ICD-10-CM | POA: Diagnosis not present

## 2023-07-07 DIAGNOSIS — R2689 Other abnormalities of gait and mobility: Secondary | ICD-10-CM

## 2023-07-07 NOTE — Therapy (Signed)
OUTPATIENT PHYSICAL THERAPY LOWER EXTREMITY TREATMENT   Patient Name: Mackenzie [name] MRN: [id number] DOB:1971/04/06, 53 y.o., female Today's Date: 07/07/2023  END OF SESSION:  PT End of Session - 07/07/23 0857     Visit Number 4    Number of Visits 7    Date for PT Re-Evaluation 07/23/23    Progress Note Due on Visit 20    PT Start Time 0847    PT Stop Time 0927    PT Time Calculation (min) 40 min    Activity Tolerance Patient tolerated treatment well;Patient limited by pain    Behavior During Therapy [facility] for tasks assessed/performed                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, left (HCC) 01/23/2017   Cellulitis of right leg    Edema, lower extremity    GERD (gastroesophageal reflux disease)    Healthcare maintenance 03/25/2017   Herpesviral infection    Hot flashes 03/2017   Hyperlipidemia 03/20/2016   Impaired fasting glucose    Morbid obesity with body mass index (BMI) of 40.0 to 49.9 (HCC) 09/17/2017   Neoplasm of cervix    Osteoarthritis of knee    Other chest pain    Other specified disorders of veins    Polyneuropathy    Postmenopausal atrophic vaginitis    Pre-diabetes 07/26/2018   Hgb A1C elevated on 07/26/2018, Gestational Diabetes 2012   Pure hypercholesterolemia 03/30/2023   Seasonal allergies 2012   seasonal allergies causes allergic rhinitis and itchy, dry eyes per pt   Stress incontinence    Tachycardia    Thyrotoxicosis    Vitamin D deficiency 05/2015   Past Surgical History:  Procedure Laterality Date   BREAST RECONSTRUCTION WITH PLACEMENT OF TISSUE EXPANDER AND FLEX HD (ACELLULAR HYDRATED DERMIS) Left 08/27/2017   Procedure: LEFT BREAST  RECONSTRUCTION WITH PLACEMENT OF TISSUE EXPANDER AND FLEX HD;  Surgeon: Peggye Form, DO;  Location: [facility];  Service: Plastics;  Laterality: Left;   CESAREAN SECTION     x2   KNEE ARTHROSCOPY WITH MEDIAL MENISECTOMY Right 11/12/2022   Procedure: RIGHT KNEE ARTHROSCOPY, PARTIAL MEDIAL MENISCECTOMY;  Surgeon: Tarry Kos, MD;  Location: [facility];  Service: Orthopedics;  Laterality: Right;   LATISSIMUS FLAP TO BREAST Left 11/03/2018   LATISSIMUS FLAP TO BREAST Left 11/03/2018   Procedure: LATISSIMUS FLAP TO LEFT BREAST;  Surgeon: Peggye Form, DO;  Location: [facility];  Service: Plastics;  Laterality: Left;   MASTECTOMY MODIFIED RADICAL Left 08/27/2017   MASTECTOMY MODIFIED RADICAL Left 08/27/2017   Procedure: LEFT MODIFIED RADICAL MASTECTOMY;  Surgeon: Harriette Bouillon, MD;  Location: [facility];  Service: General;  Laterality: Left;   MASTOPEXY Right 03/03/2019   Procedure: RIGHT BREAST MASTOPEXY/REDUCTION;  Surgeon: Peggye Form, DO;  Location: [facility];  Service: Plastics;  Laterality: Right;  3 hours, please   PORT-A-CATH REMOVAL Right 08/27/2017   Procedure: REMOVAL PORT-A-CATH RIGHT CHEST;  Surgeon: Harriette Bouillon, MD;  Location: [facility];  Service: General;  Laterality: Right;   PORTACATH PLACEMENT Right 01/28/2017   Procedure: INSERTION PORT-A-CATH WITH ULTRASOUND;  Surgeon: Harriette Bouillon, MD;  Location:  [facility];  Service: General;  Laterality: Right;   REDUCTION MAMMAPLASTY Right    REMOVAL OF TISSUE EXPANDER AND PLACEMENT OF IMPLANT Left 03/03/2019   Procedure: REMOVAL OF TISSUE EXPANDER AND PLACEMENT OF EXPANDER;  Surgeon: Peggye Form, DO;  Location: [facility];  Service: Plastics;  Laterality: Left;   REMOVAL OF TISSUE EXPANDER AND PLACEMENT OF IMPLANT Left 05/25/2019   Procedure: LEFT BREAST REMOVAL OF TISSUE EXPANDER AND PLACEMENT OF IMPLANT;  Surgeon: Peggye Form, DO;  Location: [facility];  Service: Plastics;  Laterality: Left;   TISSUE EXPANDER PLACEMENT Left 11/03/2018   Procedure: PLACEMENT OF TISSUE EXPANDER LEFT BREAST;  Surgeon: Peggye Form, DO;  Location: [facility];  Service: Plastics;  Laterality: Left;  Total case time is 3.5 hours   TUBAL LIGATION Bilateral 01/21/2011   Patient Active Problem List   Diagnosis Date Noted   Pain of Right Knee 07/06/2023   History of breast reconstruction 05/15/2023   Pure hypercholesterolemia 03/30/2023   Acute meniscal tear, medial, right, initial encounter 11/12/2022   Plica syndrome of right knee 11/12/2022   Chondromalacia, right knee 11/12/2022   Depression 08/13/2022   BMI 40.0-44.9, adult (HCC) 08/13/2022   Obesity, Beginning BMI 08/13/2022   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   Boil of buttock, Left 12/24/2021   Hyperthyroidism 07/22/2021   Low serum thyroid stimulating hormone (TSH) 06/12/2021   Other fatigue 05/23/2021   Hemoglobin low 05/23/2021   Polyphagia 05/16/2020   Vitamin D deficiency 05/16/2020   At risk for osteoporosis 05/16/2020   Prediabetes 05/01/2020   Elevated blood pressure reading 05/01/2020   At risk for impaired metabolic function 05/01/2020   Breast asymmetry following reconstructive surgery 09/20/2019   Essential hypertension 08/17/2018   Class 3 severe obesity with serious comorbidity and body mass index (BMI) of 40.0 to 44.9 in adult [facility]) 09/17/2017   Acquired absence of left breast 09/15/2017   Malignant neoplasm of upper-inner quadrant of left breast in female, 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   Healthcare maintenance 03/25/2017   Malignant 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, estrogen receptor positive (HCC) 01/23/2017   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, left (HCC) 01/23/2017    PCP: Dois Davenport, MD  REFERRING PROVIDER: Tarry Kos, MD  REFERRING DIAG: M17.11 (ICD-10-CM) - Unilateral primary  osteoarthritis, right knee   THERAPY DIAG:  Chronic pain of right knee  Pain in right ankle and joints of right foot  Muscle weakness (generalized)  Other abnormalities of gait and mobility  Stiffness of right knee, not elsewhere classified  Stiffness of right ankle, not elsewhere classified  Rationale for Evaluation and Treatment: Rehabilitation  ONSET DATE: 05/26/2023 referral to PT  SUBJECTIVE:   SUBJECTIVE STATEMENT: Pt reporting 5/10 pain with stair navigation. Pt stating there are days that she feels better and there are still days that her pain limits her activities. Pt reporting her knee hasn't "gave way" since she was here last.   PERTINENT HISTORY: Right knee arthroscopy 11/12/22, History of Lt breast cancer, GERD, hyperlipidemia,   PAIN:  NPRS scale: 5/10 c stairs, worse pain 7/10 in knee, 6/10 achilles Pain location: right knee more medial/anterior, and Achilles tendon Pain description: knee at times sharp / throbbing, general dull ache;  Achilles is pulling, sharp stretch Aggravating factors: walking esp stairs, getting up when stiff,  Relieving factors: meds, elevate, rest  PRECAUTIONS: NO BP LUE  WEIGHT BEARING RESTRICTIONS:  No  FALLS:  Has patient fallen in last 6 months? No  LIVING ENVIRONMENT: Lives with: lives with their daughter 12yo Lives in: House Stairs: Yes: External: 3 steps; none;  sunken den single step no rail.   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   Next MD visit:  not scheduled  OBJECTIVE:  DIAGNOSTIC FINDINGS: 04/21/2023 right ankle X-ray showed Retrocalcaneal osteophyte formation  PATIENT SURVEYS:  FOTO intake:  52%  predicted:  64%  COGNITION: Overall cognitive status: WFL    SENSATION: WFL  PALPATION: Right knee tenderness  to palpation medial joint line and quadriceps tendon.  Denies tenderness to Quad muscle, Patella Tendon or other (not medial) areas of joint line.  Right ankle tenderness to palpation Achilles Tendon. Denies tenderness to Gastroc belly, Plantar Fascia or with Great Toe extension.   LOWER EXTREMITY ROM:   ROM Right eval Right 06/29/23 Rt 07/07/23 supine  Hip flexion     Hip extension     Hip abduction     Hip adduction     Hip internal rotation     Hip external rotation     Knee flexion Supine A: 105* P: 112* with pain medial knee / distal quad Supine P: 113* A: 120 P; 122  Knee extension Supine A: quad set -5* with pain in proximal gastroc Supine P: 0* A: 0  Ankle dorsiflexion Supine with Knee extended  A: +9* P: +10* Knee pain/none Achielles Supine with knee flexed A: 16* P: 20* Standing DF with knee ext P: +19* AA: 10  Ankle plantarflexion Supine A: 21* P: 27* Knee pain/none Achielles    Ankle inversion     Ankle eversion      (Blank rows = not tested)  LOWER EXTREMITY MMT:  MMT Right eval Left eval  Hip flexion    Hip extension    Hip abduction    Hip adduction    Hip internal rotation    Hip external rotation    Knee flexion 4/5 4+  Knee extension 4/5 4+  Ankle dorsiflexion    Ankle plantarflexion 3-/5 3+ (tested standing with calf raise)  Ankle inversion    Ankle eversion     (Blank rows = not tested)  FUNCTIONAL TESTS:  18 inch chair transfer: able to arise without UE assist but weight shifts to left 5 times sit to stand: 16.53 sec Lt SLS: 1st attempt 3.96 sec 2nd attempt 11.84 sec Rt SLS: 1st attempt 7.00 sec 2nd attempt 12.89 sec  07/07/23:  Rt SLS: 1st attempt 13.2 seconds, 2nd attempt: 23.7, 3rd attempt: 33.9 seconds Left SLS: 1st attempt 30.1 seconds 5 times sit to stand: 11.6 sec  GAIT: Distance walked: 100'  Assistive device utilized: Single point cane Level of assistance: SBA Comments: antalgic with decreased stance duration RLE, right  knee flexed in stance, early heel rise in terminal stance with little to no push-off noted.       TODAY'S TREATMENT  DATE: 07/07/2023:  Recumbent bike: level 4 x 8 minutes IT band stretch: x 2 holding 30 sec Hamstring stretch x 2 holding 30 sec Calf raises: bil up, lowering with Rt only 2 x 10  Leg Press: 100# bil 2 x 10 single leg 50# 2 x 10   NMR:  SLS see scores above Sit to stand: 2 x 5 see scores above Tandem stance: 30 seconds Up and down 1 flight stairs Rt hand rail (pain 5/10 in Rt knee and ankle)     TODAY'S TREATMENT                                                                          DATE: 06/29/2023:  Therapeutic Exercise: Bike seat 6 for 8 min level 3. 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   Gastroc stretch RLE on step heel depression 30 sec hold 3 reps with 10 heel raises 2 sets bw stretches  Leg press BLEs 100# 15 reps RLE only 50# 10 reps PT reviewed need for HEP to include flexibility, strength, endurance (muscle) and balance.  Pt verbalized understanding.   Neuromuscular Reeducation: Standing on foam alternating LEs 10 reps ea flexion, abduction & extension. Standing on foam alternating bw DF & PF 10 reps. Braiding 3 laps first with UE support & 2 laps without UE support     TREATMENT                                                                          DATE: 06/23/2023:  Therapeutic Exercise: PT reviewed HEP for 1st session. Pt performed  BLEs hip flexor stretch 30 sec hold1 rep, then quad stretch 30 sec hold 2 reps Supine BLEs hamstring stretch 30 sec hold 1 rep, then ITB stretch with strap 30 sec hold 1 rep, hip adductor 30 sec 1 rep Sidelying clam shell with green theraband 5 sec hold 10 reps Squat over chair with good form 5 reps. Bike seat 6 for 8 min level 1.  PT educated on set up and recommended 2-3 sets 10  min with timed rest and whining down rest until riding 30 min.  PT updated HEP (see below) with HO, verbal & demo cues.  Pt verbalized understanding.          PATIENT EDUCATION:  Access Code: [email or web address] URL: [email or web address] Date: 06/29/2023 Prepared by: Vladimir Faster  Exercises - Seated Hamstring Stretch  - 1 x daily - 7 x weekly - 2-3 reps - 30 seconds hold - Gastroc Stretch on Step  - 1 x daily - 7 x weekly - 1 sets - 2-3 reps - 30 seconds hold - standing calf stretch with forefoot on small step or brick  - 1 x daily - 7 x weekly - 1 sets - 2-3 reps - 30 seconds hold - Standing Soleus Stretch on Foam 1/2 Roll  - 1 x daily -  7 x weekly - 1 sets - 2-3 reps - 30 seconds hold - Modified Thomas Stretch  - 1 x daily - 7 x weekly - 1 sets - 2-3 reps - 30 seconds hold - Supine Quadriceps Stretch with Strap on Table  - 1 x daily - 7 x weekly - 1 sets - 2-3 reps - 30 seconds hold - Hooklying Hamstring Stretch with Strap  - 1 x daily - 7 x weekly - 1 sets - 2-3 reps - 30 seconds hold - Supine ITB Stretch with Strap  - 1 x daily - 7 x weekly - 1 sets - 2-3 reps - 30 seconds hold - Hip Adductors and Hamstring Stretch with Strap  - 1 x daily - 7 x weekly - 1 sets - 2-3 reps - 30 seconds hold - Supine Bridge with Mini Swiss Ball Between Knees  - 1 x daily - 7 x weekly - 2 sets - 10 reps - 5 seconds hold - Clamshell with Resistance  - 1 x daily - 7 x weekly - 4 sets - 10 reps - Supine Active Straight Leg Raise  - 1 x daily - 7 x weekly - 4 sets - 10 reps - 2-3 seconds hold - Seated Straight Leg Raise  - 1 x daily - 7 x weekly - 3 sets - 10 reps - Squat with Chair Touch  - 1 x daily - 7 x weekly - 10 reps - standing alternating legs hip abduction on foam pad  - 1 x daily - 5 x weekly - 1 sets - 10 reps - 5 seconds hold - standing alternating legs hip extension on foam pad  - 1 x daily - 5 x weekly - 1 sets - 10 reps - 5 seconds hold - standing alternating legs hip flexion on foam  pad  - 1 x daily - 5 x weekly - 1 sets - 10 reps - 5 seconds hold - Carioca with Counter Support  - 1 x daily - 7 x weekly - 1 sets - 15 reps  ASSESSMENT: CLINICAL IMPRESSION: Pt has improved her SLS bilaterally as well as her 5 time sit to stand. Pt is making progress with her overall LE strength. Pt sill reporting pain of 5-6/10 at times with ADL's and stair navigation. Continue skilled PT services per pt's treatment plan.   OBJECTIVE IMPAIRMENTS: Abnormal gait, decreased activity tolerance, decreased balance, decreased endurance, decreased knowledge of condition, decreased knowledge of use of DME, decreased mobility, difficulty walking, decreased ROM, decreased strength, impaired flexibility, obesity, and pain.   ACTIVITY LIMITATIONS: carrying, lifting, bending, sitting, standing, squatting, sleeping, stairs, transfers, and locomotion level  PARTICIPATION LIMITATIONS: meal prep, cleaning, community activity, and occupation  PERSONAL FACTORS: Fitness, Past/current experiences, Time since onset of injury/illness/exacerbation, and 3+ comorbidities: see PMH  are also affecting patient's functional outcome.   REHAB POTENTIAL: Good  CLINICAL DECISION MAKING: Stable/uncomplicated  EVALUATION COMPLEXITY: Moderate   GOALS: Goals reviewed with patient? Yes  SHORT TERM GOALS: (target date for Short term goals 06/29/2023)   1.  Patient will demonstrate independent use of home exercise program to maintain progress from in clinic treatments. Baseline: See objective data Goal status: MET 06/29/2023  2. PROM right knee 0* - 110* Baseline: See objective data Goal status: MET 06/29/2023  3. PROM right ankle Dorsiflexion +15* with knee extended Baseline: See objective data Goal status: MET 06/29/2023  LONG TERM GOALS: (target dates for all long term goals  08/06/2023 )   1.  Patient will demonstrate/report pain at worst less than or equal to 2/10 to facilitate minimal limitation in daily activity  secondary to pain symptoms. Baseline: See objective data Goal status: Ongoing 07/07/2023   2. Patient will demonstrate independent use of home exercise program to facilitate ability to maintain/progress functional gains from skilled physical therapy services. Baseline: See objective data Goal status: Ongoing 07/07/2023   3. Patient will demonstrate FOTO outcome > or = 64 % to indicate reduced disability due to condition. Baseline: See objective data Goal status: Ongoing 06/23/2023   4.  Patient will demonstrate right knee and ankle LE MMT 5/5 throughout to faciltiate usual transfers, stairs, squatting at [facility] for daily life.  Baseline: See objective data Goal status: Ongoing 07/07/2023   5.  Patient able to negotiate stairs with single rail alternating pattern modified independent with knee pain </= 2/10. Baseline: See objective data Goal status:   Partially met  06/23/2023    PLAN:  PT FREQUENCY:  1x/week  PT DURATION: 6 weeks  PLANNED INTERVENTIONS: 97164- PT Re-evaluation, 97110-Therapeutic exercises, 97530- Therapeutic activity, 97112- Neuromuscular re-education, 97535- Self Care, 95621- Manual therapy, [phone]- Gait training, Patient/Family education, Balance training, Stair training, Taping, Dry Needling, Joint mobilization, DME instructions, Cryotherapy, and Moist heat  PLAN FOR NEXT SESSION:  work on stairs, work towards [employer], PT, MPT 07/07/2023, 9:33 AM

## 2023-07-08 ENCOUNTER — Encounter: Payer: Self-pay | Admitting: Physical Medicine and Rehabilitation

## 2023-07-08 NOTE — Progress Notes (Signed)
Mackenzie [name] - 53 y.o. female MRN [id number]  Date of birth: Jul 06, 1970  Office Visit Note: Visit Date: 07/03/2023 PCP: Dois Davenport, MD Referred by: Tarry Kos, MD  Subjective: Chief Complaint  Patient presents with   Right Arm - Numbness, Tingling   HPI: Mackenzie [name] is a 53 y.o. female who comes in today at the request of Dr. Glee Arvin for evaluation and management of chronic, worsening and severe pain, numbness and tingling in the Left upper extremity.  Patient is Right hand dominant.  She reports several month history of progressive worsening left hand pain with numbness and tingling somewhat nondermatomal that can radiate up into the arms bilaterally.  No frank radicular symptoms down the arms.  She does have neck pain at times but again no history of prior cervical surgery or cervical issues.  She is not diabetic but hemoglobin A1c in the prediabetic range.  She has had nocturnal complaints but really progressive even during the day now with activities.  She does use her hand a lot at work.  She has had no prior electrodiagnostic study.  She does have difficulty with ADLs.   I spent more than 30 minutes speaking face-to-face with the patient with 50% of the time in counseling and discussing coordination of care.      Review of Systems  Musculoskeletal:  Positive for joint pain.  Neurological:  Positive for tingling.  All other systems reviewed and are negative.  Otherwise per HPI.  Assessment & Plan: Visit Diagnoses:    ICD-10-CM   1. Paresthesia of skin  R20.2     2. Paresthesia of left upper extremity  R20.2 NCV with EMG (electromyography)    3. 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  Electrodiagnostic study performed today.  The above electrodiagnostic study is ABNORMAL and reveals  evidence of a moderate Left median nerve entrapment at the wrist (carpal tunnel syndrome) affecting sensory and motor components.   There is no significant electrodiagnostic evidence of any other focal nerve entrapment, brachial plexopathy or cervical radiculopathy.   Recommendations: 1.  Follow-up with referring physician. 2.  Continue current management of symptoms. 3.  Continue use of resting splint at night-time and as needed during the day. 4.  Suggest surgical evaluation.  Meds & Orders: No orders of the defined types were placed in this encounter.   Orders Placed This Encounter  Procedures   NCV with EMG (electromyography)    Follow-up: Return for  Glee Arvin, MD.   Procedures: No procedures performed  EMG & NCV Findings: Evaluation of the left median motor nerve showed prolonged distal onset latency (5.3 ms) and decreased conduction velocity (Elbow-Wrist, 47 m/s).  The left median (across palm) sensory nerve showed prolonged distal peak latency (Wrist, 6.9 ms), reduced amplitude (7.7 V), and prolonged distal peak latency (Palm, 2.6 ms).  All remaining nerves (as indicated in the following tables) were within normal limits.    All examined muscles (as indicated in the following table) showed no evidence of electrical instability.    Impression: The above electrodiagnostic study is ABNORMAL and reveals evidence of a moderate Left median nerve entrapment at the wrist (carpal tunnel syndrome) affecting sensory and motor components.   There is no significant electrodiagnostic evidence of any other focal nerve entrapment,  brachial plexopathy or cervical radiculopathy.   Recommendations: 1.  Follow-up with referring physician. 2.  Continue current management of symptoms. 3.  Continue use of resting splint at night-time and as needed during the day. 4.  Suggest surgical evaluation.  ___________________________ Naaman Plummer FAAPMR Board Certified, American Board of Physical  Medicine and Rehabilitation    Nerve Conduction Studies Anti Sensory Summary Table   Stim Site NR Peak (ms) Norm Peak (ms) P-T Amp (V) Norm P-T Amp Site1 Site2 Delta-P (ms) Dist (cm) Vel (m/s) Norm Vel (m/s)  Left Median Acr Palm Anti Sensory (2nd Digit)  31.6C  Wrist    *6.9 <3.6 *7.7 >10 Wrist Palm 4.3 0.0    Palm    *2.6 <2.0 0.3         Left Radial Anti Sensory (Base 1st Digit)  31C  Wrist    2.0 <3.1 22.9  Wrist Base 1st Digit 2.0 0.0    Left Ulnar Anti Sensory (5th Digit)  31.8C  Wrist    3.7 <3.7 18.7 >15.0 Wrist 5th Digit 3.7 14.0 38 >38   Motor Summary Table   Stim Site NR Onset (ms) Norm Onset (ms) O-P Amp (mV) Norm O-P Amp Site1 Site2 Delta-0 (ms) Dist (cm) Vel (m/s) Norm Vel (m/s)  Left Median Motor (Abd Poll Brev)  31.5C  Wrist    *5.3 <4.2 9.0 >5 Elbow Wrist 5.4 25.5 *47 >50  Elbow    10.7  8.2         Left Ulnar Motor (Abd Dig Min)  31.2C  Wrist    2.9 <4.2 4.6 >3 B Elbow Wrist 3.7 21.5 58 >53  B Elbow    6.6  3.6  A Elbow B Elbow 1.6 11.0 69 >53  A Elbow    8.2  1.9          EMG   Side Muscle Nerve Root Ins Act Fibs Psw Amp Dur Poly Recrt Int Dennie Bible Comment  Left Abd Poll Brev Median C8-T1 Nml Nml Nml Nml Nml 0 Nml Nml   Left 1stDorInt Ulnar C8-T1 Nml Nml Nml Nml Nml 0 Nml Nml   Left PronatorTeres Median C6-7 Nml Nml Nml Nml Nml 0 Nml Nml   Left Biceps Musculocut C5-6 Nml Nml Nml Nml Nml 0 Nml Nml   Left Deltoid Axillary C5-6 Nml Nml Nml Nml Nml 0 Nml Nml     Nerve Conduction Studies Anti Sensory Left/Right Comparison   Stim Site L Lat (ms) R Lat (ms) L-R Lat (ms) L Amp (V) R Amp (V) L-R Amp (%) Site1 Site2 L Vel (m/s) R Vel (m/s) L-R Vel (m/s)  Median Acr Palm Anti Sensory (2nd Digit)  31.6C  Wrist *6.9   *7.7   Wrist Palm     Palm *2.6   0.3         Radial Anti Sensory (Base 1st Digit)  31C  Wrist 2.0   22.9   Wrist Base 1st Digit     Ulnar Anti Sensory (5th Digit)  31.8C  Wrist 3.7   18.7   Wrist 5th Digit 38     Motor Left/Right  Comparison   Stim Site L Lat (ms) R Lat (ms) L-R Lat (ms) L Amp (mV) R Amp (mV) L-R Amp (%) Site1 Site2 L Vel (m/s) R Vel (m/s) L-R Vel (m/s)  Median Motor (Abd Poll Brev)  31.5C  Wrist *5.3   9.0   Elbow Wrist *47    Elbow 10.7   8.2  Ulnar Motor (Abd Dig Min)  31.2C  Wrist 2.9   4.6   B Elbow Wrist 58    B Elbow 6.6   3.6   A Elbow B Elbow 69    A Elbow 8.2   1.9            Waveforms:            Clinical History: No specialty comments available.   She reports that she has never smoked. She has never used smokeless tobacco.  Recent Labs    09/03/22 1543 01/19/23 0939 06/08/23 1036 06/09/23 0906  HGBA1C 5.8* 6.0* 6.1*  --   LABURIC  --   --   --  4.2    Objective:  VS:  HT:    WT:   BMI:     BP:   HR: bpm  TEMP: ( )  RESP:  Physical Exam Vitals and nursing note reviewed.  Constitutional:      General: She is not in acute distress.    Appearance: Normal appearance. She is well-developed. She is not ill-appearing.  HENT:     Head: Normocephalic and atraumatic.     Nose: Nose normal.     Mouth/Throat:     Mouth: Mucous membranes are moist.     Pharynx: Oropharynx is clear.  Eyes:     Conjunctiva/sclera: Conjunctivae normal.     Pupils: Pupils are equal, round, and reactive to light.  Cardiovascular:     Rate and Rhythm: Normal rate and regular rhythm.     Pulses: Normal pulses.  Pulmonary:     Effort: Pulmonary effort is normal. No respiratory distress.  Abdominal:     General: There is no distension.     Palpations: Abdomen is soft.     Tenderness: There is no guarding.  Musculoskeletal:        General: No swelling, tenderness or deformity.     Cervical back: Normal range of motion and neck supple.     Right lower leg: No edema.     Left lower leg: No edema.     Comments: Inspection reveals no atrophy of the bilateral APB or FDI or hand intrinsics. There is no swelling, color changes, allodynia or dystrophic changes. There is 5 out of 5  strength in the bilateral wrist extension, finger abduction and long finger flexion. There is intact sensation to light touch in all dermatomal and peripheral nerve distributions. There is a negative Froment's test bilaterally. There is a negative Tinel's test at the bilateral wrist and elbow. There is a positive Phalen's test on the left. There is a negative Hoffmann's test bilaterally.  Skin:    General: Skin is warm and dry.     Findings: No erythema or rash.  Neurological:     General: No focal deficit present.     Mental Status: She is alert and oriented to person, place, and time.     Cranial Nerves: No cranial nerve deficit.     Sensory: No sensory deficit.     Motor: No weakness or abnormal muscle tone.     Coordination: Coordination normal.     Gait: Gait normal.  Psychiatric:        Mood and Affect: Mood normal.        Behavior: Behavior normal.        Thought Content: Thought content normal.     Ortho Exam  Imaging: No results found.  Past Medical/Family/Surgical/Social History: Medications & Allergies reviewed per EMR,  new medications updated. 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   Boil of buttock, Left 12/24/2021   Hyperthyroidism 07/22/2021   Low serum thyroid stimulating hormone (TSH) 06/12/2021   Other fatigue 05/23/2021   Hemoglobin low 05/23/2021   Polyphagia 05/16/2020   Vitamin D deficiency 05/16/2020   At risk for osteoporosis 05/16/2020    Prediabetes 05/01/2020   Elevated blood pressure reading 05/01/2020   At risk for impaired metabolic function 05/01/2020   Breast asymmetry following reconstructive surgery 09/20/2019   Essential hypertension 08/17/2018   Class 3 severe obesity with serious comorbidity and body mass index (BMI) of 40.0 to 44.9 in adult [facility]) 09/17/2017   Acquired absence of left breast 09/15/2017   Malignant neoplasm of upper-inner quadrant of left breast in female, 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   Healthcare maintenance 03/25/2017   Malignant 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, estrogen receptor positive (HCC) 01/23/2017   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, left (HCC) 01/23/2017   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, left (HCC) 01/23/2017   Cellulitis of right leg    Edema, lower extremity    GERD (gastroesophageal reflux disease)    Healthcare maintenance 03/25/2017   Herpesviral infection    Hot flashes 03/2017   Hyperlipidemia 03/20/2016   Impaired fasting glucose    Morbid obesity with body mass index (BMI) of 40.0 to 49.9 (HCC) 09/17/2017   Neoplasm of cervix    Osteoarthritis of knee    Other chest pain    Other specified disorders of veins    Polyneuropathy    Postmenopausal atrophic vaginitis    Pre-diabetes 07/26/2018   Hgb A1C elevated on 07/26/2018, Gestational Diabetes 2012   Pure hypercholesterolemia 03/30/2023   Seasonal allergies 2012   seasonal allergies causes allergic rhinitis and itchy, dry eyes per pt   Stress incontinence    Tachycardia    Thyrotoxicosis    Vitamin D deficiency 05/2015   Family History  Problem Relation Age of Onset    Other Mother        d.56 from complications of surgery to remove brain tumor   Diabetes Mother    Hypertension Mother    Stroke Mother    Kidney disease Father    Hypertension Maternal Grandmother    Breast cancer Paternal Grandmother 84       d.60s from breast cancer. Did not have treatment.   Hypertension Other    Diabetes Other    Stroke Other    Past Surgical History:  Procedure Laterality Date   BREAST RECONSTRUCTION WITH PLACEMENT OF TISSUE EXPANDER AND FLEX HD (ACELLULAR HYDRATED DERMIS) Left 08/27/2017   Procedure: LEFT BREAST RECONSTRUCTION  WITH PLACEMENT OF TISSUE EXPANDER AND FLEX HD;  Surgeon: Peggye Form, DO;  Location: [facility];  Service: Plastics;  Laterality: Left;   CESAREAN SECTION     x2   KNEE ARTHROSCOPY WITH MEDIAL MENISECTOMY Right 11/12/2022   Procedure: RIGHT KNEE ARTHROSCOPY, PARTIAL MEDIAL MENISCECTOMY;  Surgeon: Tarry Kos, MD;  Location: [facility];  Service: Orthopedics;  Laterality: Right;   LATISSIMUS FLAP TO BREAST Left 11/03/2018   LATISSIMUS FLAP TO BREAST Left 11/03/2018   Procedure: LATISSIMUS FLAP TO LEFT BREAST;  Surgeon: Peggye Form, DO;  Location: [facility];  Service: Plastics;  Laterality: Left;   MASTECTOMY MODIFIED RADICAL Left 08/27/2017   MASTECTOMY MODIFIED RADICAL Left 08/27/2017   Procedure: LEFT MODIFIED RADICAL MASTECTOMY;  Surgeon: Harriette Bouillon, MD;  Location: [facility];  Service: General;  Laterality: Left;   MASTOPEXY Right 03/03/2019   Procedure: RIGHT BREAST MASTOPEXY/REDUCTION;  Surgeon: Peggye Form, DO;  Location: [facility];  Service: Plastics;  Laterality: Right;  3 hours, please   PORT-A-CATH REMOVAL Right 08/27/2017   Procedure: REMOVAL PORT-A-CATH RIGHT CHEST;  Surgeon: Harriette Bouillon, MD;  Location: [facility];  Service: General;  Laterality: Right;   PORTACATH PLACEMENT Right 01/28/2017   Procedure: INSERTION PORT-A-CATH WITH ULTRASOUND;  Surgeon: Harriette Bouillon, MD;  Location:  [facility];  Service: General;  Laterality: Right;   REDUCTION MAMMAPLASTY Right    REMOVAL OF TISSUE EXPANDER AND PLACEMENT OF IMPLANT Left 03/03/2019   Procedure: REMOVAL OF TISSUE EXPANDER AND PLACEMENT OF EXPANDER;  Surgeon: Peggye Form, DO;  Location: [facility];  Service: Plastics;  Laterality: Left;   REMOVAL OF TISSUE EXPANDER AND PLACEMENT OF IMPLANT Left 05/25/2019   Procedure: LEFT BREAST REMOVAL OF TISSUE EXPANDER AND PLACEMENT OF IMPLANT;  Surgeon: Peggye Form, DO;  Location: [facility];  Service: Plastics;  Laterality: Left;   TISSUE EXPANDER PLACEMENT Left 11/03/2018   Procedure: PLACEMENT OF TISSUE EXPANDER LEFT BREAST;  Surgeon: Peggye Form, DO;  Location: [facility];  Service: Plastics;  Laterality: Left;  Total case time is 3.5 hours   TUBAL LIGATION Bilateral 01/21/2011   Social History   Occupational History   Occupation: Charity fundraiser  Tobacco Use   Smoking status: Never   Smokeless tobacco: Never  Vaping Use   Vaping status: Never Used  Substance and Sexual Activity   Alcohol use: Yes    Comment: social   Drug use: No   Sexual activity: Not on file    Comment: BTL

## 2023-07-08 NOTE — Procedures (Signed)
EMG & NCV Findings: Evaluation of the left median motor nerve showed prolonged distal onset latency (5.3 ms) and decreased conduction velocity (Elbow-Wrist, 47 m/s).  The left median (across palm) sensory nerve showed prolonged distal peak latency (Wrist, 6.9 ms), reduced amplitude (7.7 V), and prolonged distal peak latency (Palm, 2.6 ms).  All remaining nerves (as indicated in the following tables) were within normal limits.    All examined muscles (as indicated in the following table) showed no evidence of electrical instability.    Impression: The above electrodiagnostic study is ABNORMAL and reveals evidence of a moderate Left median nerve entrapment at the wrist (carpal tunnel syndrome) affecting sensory and motor components.   There is no significant electrodiagnostic evidence of any other focal nerve entrapment, brachial plexopathy or cervical radiculopathy.   Recommendations: 1.  Follow-up with referring physician. 2.  Continue current management of symptoms. 3.  Continue use of resting splint at night-time and as needed during the day. 4.  Suggest surgical evaluation.  ___________________________ Naaman Plummer FAAPMR Board Certified, American Board of Physical Medicine and Rehabilitation    Nerve Conduction Studies Anti Sensory Summary Table   Stim Site NR Peak (ms) Norm Peak (ms) P-T Amp (V) Norm P-T Amp Site1 Site2 Delta-P (ms) Dist (cm) Vel (m/s) Norm Vel (m/s)  Left Median Acr Palm Anti Sensory (2nd Digit)  31.6C  Wrist    *6.9 <3.6 *7.7 >10 Wrist Palm 4.3 0.0    Palm    *2.6 <2.0 0.3         Left Radial Anti Sensory (Base 1st Digit)  31C  Wrist    2.0 <3.1 22.9  Wrist Base 1st Digit 2.0 0.0    Left Ulnar Anti Sensory (5th Digit)  31.8C  Wrist    3.7 <3.7 18.7 >15.0 Wrist 5th Digit 3.7 14.0 38 >38   Motor Summary Table   Stim Site NR Onset (ms) Norm Onset (ms) O-P Amp (mV) Norm O-P Amp Site1 Site2 Delta-0 (ms) Dist (cm) Vel (m/s) Norm Vel (m/s)  Left Median Motor  (Abd Poll Brev)  31.5C  Wrist    *5.3 <4.2 9.0 >5 Elbow Wrist 5.4 25.5 *47 >50  Elbow    10.7  8.2         Left Ulnar Motor (Abd Dig Min)  31.2C  Wrist    2.9 <4.2 4.6 >3 B Elbow Wrist 3.7 21.5 58 >53  B Elbow    6.6  3.6  A Elbow B Elbow 1.6 11.0 69 >53  A Elbow    8.2  1.9          EMG   Side Muscle Nerve Root Ins Act Fibs Psw Amp Dur Poly Recrt Int Dennie Bible Comment  Left Abd Poll Brev Median C8-T1 Nml Nml Nml Nml Nml 0 Nml Nml   Left 1stDorInt Ulnar C8-T1 Nml Nml Nml Nml Nml 0 Nml Nml   Left PronatorTeres Median C6-7 Nml Nml Nml Nml Nml 0 Nml Nml   Left Biceps Musculocut C5-6 Nml Nml Nml Nml Nml 0 Nml Nml   Left Deltoid Axillary C5-6 Nml Nml Nml Nml Nml 0 Nml Nml     Nerve Conduction Studies Anti Sensory Left/Right Comparison   Stim Site L Lat (ms) R Lat (ms) L-R Lat (ms) L Amp (V) R Amp (V) L-R Amp (%) Site1 Site2 L Vel (m/s) R Vel (m/s) L-R Vel (m/s)  Median Acr Palm Anti Sensory (2nd Digit)  31.6C  Wrist *6.9   *7.7   Wrist  Palm     Palm *2.6   0.3         Radial Anti Sensory (Base 1st Digit)  31C  Wrist 2.0   22.9   Wrist Base 1st Digit     Ulnar Anti Sensory (5th Digit)  31.8C  Wrist 3.7   18.7   Wrist 5th Digit 38     Motor Left/Right Comparison   Stim Site L Lat (ms) R Lat (ms) L-R Lat (ms) L Amp (mV) R Amp (mV) L-R Amp (%) Site1 Site2 L Vel (m/s) R Vel (m/s) L-R Vel (m/s)  Median Motor (Abd Poll Brev)  31.5C  Wrist *5.3   9.0   Elbow Wrist *47    Elbow 10.7   8.2         Ulnar Motor (Abd Dig Min)  31.2C  Wrist 2.9   4.6   B Elbow Wrist 58    B Elbow 6.6   3.6   A Elbow B Elbow 69    A Elbow 8.2   1.9            Waveforms:

## 2023-07-08 NOTE — Therapy (Incomplete)
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, left (HCC) 01/23/2017   Cellulitis of right leg    Edema, lower extremity    GERD (gastroesophageal reflux disease)    Healthcare maintenance 03/25/2017   Herpesviral infection    Hot flashes 03/2017   Hyperlipidemia 03/20/2016   Impaired fasting glucose    Morbid obesity with body mass index (BMI) of 40.0 to 49.9 (HCC) 09/17/2017   Neoplasm of cervix    Osteoarthritis of knee    Other chest pain    Other specified disorders of veins    Polyneuropathy    Postmenopausal atrophic vaginitis    Pre-diabetes 07/26/2018   Hgb A1C elevated on 07/26/2018, Gestational Diabetes 2012   Pure hypercholesterolemia 03/30/2023   Seasonal allergies 2012   seasonal allergies causes allergic rhinitis and itchy, dry eyes per pt   Stress incontinence    Tachycardia    Thyrotoxicosis    Vitamin D deficiency 05/2015   Past Surgical History:  Procedure Laterality Date   BREAST RECONSTRUCTION WITH PLACEMENT OF TISSUE EXPANDER AND FLEX HD (ACELLULAR HYDRATED DERMIS) Left 08/27/2017   Procedure: LEFT BREAST RECONSTRUCTION WITH PLACEMENT OF TISSUE EXPANDER AND FLEX HD;  Surgeon: Peggye Form, DO;  [facility];  Service: Plastics;  Laterality: Left;   CESAREAN SECTION     x2   KNEE ARTHROSCOPY WITH MEDIAL MENISECTOMY Right 11/12/2022   Procedure: RIGHT KNEE ARTHROSCOPY, PARTIAL MEDIAL MENISCECTOMY;  Surgeon: Tarry Kos, MD;   Location: [facility];  Service: Orthopedics;  Laterality: Right;   LATISSIMUS FLAP TO BREAST Left 11/03/2018   LATISSIMUS FLAP TO BREAST Left 11/03/2018   Procedure: LATISSIMUS FLAP TO LEFT BREAST;  Surgeon: Peggye Form, DO;  [facility];  Service: Plastics;  Laterality: Left;   MASTECTOMY MODIFIED RADICAL Left 08/27/2017   MASTECTOMY MODIFIED RADICAL Left 08/27/2017   Procedure: LEFT MODIFIED RADICAL MASTECTOMY;  Surgeon: Harriette Bouillon, MD;  [facility];  Service: General;  Laterality: Left;   MASTOPEXY Right 03/03/2019   Procedure: RIGHT BREAST MASTOPEXY/REDUCTION;  Surgeon: Peggye Form, DO;  Location: [facility];  Service: Plastics;  Laterality: Right;  3 hours, please   PORT-A-CATH REMOVAL Right 08/27/2017   Procedure: REMOVAL PORT-A-CATH RIGHT CHEST;  Surgeon: Harriette Bouillon, MD;  [facility];  Service: General;  Laterality: Right;   PORTACATH PLACEMENT Right 01/28/2017   Procedure: INSERTION PORT-A-CATH WITH ULTRASOUND;  Surgeon: Harriette Bouillon, MD;  Location: [facility];  Service: General;  Laterality: Right;   REDUCTION MAMMAPLASTY Right    REMOVAL OF TISSUE EXPANDER AND PLACEMENT OF IMPLANT Left 03/03/2019   Procedure: REMOVAL OF TISSUE EXPANDER AND PLACEMENT OF EXPANDER;  Surgeon: Peggye Form, DO;  Location: [facility];  Service: Plastics;  Laterality: Left;   REMOVAL OF TISSUE EXPANDER AND PLACEMENT OF IMPLANT Left 05/25/2019   Procedure: LEFT BREAST REMOVAL OF TISSUE EXPANDER AND PLACEMENT OF IMPLANT;  Surgeon: Peggye Form,  DO;  Location: [facility];  Service: Plastics;  Laterality: Left;   TISSUE EXPANDER PLACEMENT Left 11/03/2018   Procedure: PLACEMENT OF TISSUE EXPANDER LEFT BREAST;  Surgeon: Peggye Form, DO;  [facility];  Service: Plastics;  Laterality: Left;  Total case time is 3.5 hours   TUBAL LIGATION Bilateral 01/21/2011   Patient Active  Problem List   Diagnosis Date Noted   Pain of Right Knee 07/06/2023   History of breast reconstruction 05/15/2023   Pure hypercholesterolemia 03/30/2023   Acute meniscal tear, medial, right, initial encounter 11/12/2022   Plica syndrome of right knee 11/12/2022   Chondromalacia, right knee 11/12/2022   Depression 08/13/2022   BMI 40.0-44.9, adult (HCC) 08/13/2022   Obesity, Beginning BMI 08/13/2022   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   Boil of buttock, Left 12/24/2021   Hyperthyroidism 07/22/2021   Low serum thyroid stimulating hormone (TSH) 06/12/2021   Other fatigue 05/23/2021   Hemoglobin low 05/23/2021   Polyphagia 05/16/2020   Vitamin D deficiency 05/16/2020   At risk for osteoporosis 05/16/2020   Prediabetes 05/01/2020   Elevated blood pressure reading 05/01/2020   At risk for impaired metabolic function 05/01/2020   Breast asymmetry following reconstructive surgery 09/20/2019   Essential hypertension 08/17/2018   Class 3 severe obesity with serious comorbidity and body mass index (BMI) of 40.0 to 44.9 in adult [facility]) 09/17/2017   Acquired absence of left breast 09/15/2017   Malignant neoplasm of upper-inner quadrant of left breast in female, 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  Rationale for Evaluation and Treatment: Rehabilitation  SUBJECTIVE:   SUBJECTIVE STATEMENT: She states ***.   She is currently in PT for ***.  Pt accompanied by: {accompnied:27141}  PERTINENT HISTORY: OT eval and treat; LUE radiculopathy  PRECAUTIONS: {Therapy precautions:24002}  RED FLAGS: {PT Red  Flags:29287}   WEIGHT BEARING RESTRICTIONS: {Yes ***/No:24003}  PAIN:  Are you having pain? {OPRCPAIN:27236}  FALLS: Has patient fallen in last 6 months? {fallsyesno:27318}  LIVING ENVIRONMENT: Lives with: {OPRC lives with:25569::"lives with their family"} Lives in: {Lives in:25570} Stairs: {opstairs:27293} Has following equipment at home: {Assistive devices:23999}  PLOF: {PLOF:24004}  PATIENT GOALS: ***  NEXT MD VISIT: ***  OBJECTIVE: (All objective assessments below are from initial evaluation on: *** unless otherwise specified.)    HAND DOMINANCE: Right ***  ADLs: Overall ADLs: States decreased ability to grab, hold household objects, pain and difficulty to open containers, perform FMS tasks (manipulate fasteners on clothing), mild to moderate bathing problems as well. ***   FUNCTIONAL OUTCOME MEASURES: Eval: Quick DASH ***% impairment today  (Higher % Score  =  More Impairment)    Patient Specific Functional Scale: *** (***, ***, ***)  (Higher Score  =  Better Ability for the Selected Tasks)     Patient Rated Wrist Evaluation (PRWE): Pain: ***/50; Function: ***/50; Total Score: ***/100 (Higher Score  =  More Pain and/or Debility)    UPPER EXTREMITY ROM     Shoulder to Wrist AROM Right eval Left eval  Shoulder flexion    Shoulder abduction    Shoulder extension    Shoulder internal rotation    Shoulder external rotation  Elbow flexion    Elbow extension    Forearm supination    Forearm pronation     Wrist flexion    Wrist extension    Wrist ulnar deviation    Wrist radial deviation    Functional dart thrower's motion (F-DTM) in ulnar flexion    F-DTM in radial extension     (Blank rows = not tested)   Hand AROM Right eval Left eval  Full Fist Ability (or Gap to Distal Palmar Crease)    Thumb Opposition  (Kapandji Scale)     Thumb MCP (0-60)    Thumb IP (0-80)    Thumb Radial Abduction Span     Thumb Palmar Abduction Span     Index MCP (0-90)      Index PIP (0-100)     Index DIP (0-70)      Long MCP (0-90)      Long PIP (0-100)      Long DIP (0-70)      Ring MCP (0-90)      Ring PIP (0-100)      Ring DIP (0-70)      Little MCP (0-90)      Little PIP (0-100)      Little DIP (0-70)      (Blank rows = not tested)   UPPER EXTREMITY MMT:    Eval: *** NT at eval due to recent and still healing injuries. Will be tested when appropriate.   MMT Right TBD Left TBD  Shoulder flexion    Shoulder abduction    Shoulder adduction    Shoulder extension    Shoulder internal rotation    Shoulder external rotation    Middle trapezius    Lower trapezius    Elbow flexion    Elbow extension    Forearm supination    Forearm pronation    Wrist flexion    Wrist extension    Wrist ulnar deviation    Wrist radial deviation    (Blank rows = not tested)  HAND FUNCTION: Eval: Observed weakness in affected *** hand.  Grip strength Right: *** lbs, Left: *** lbs   COORDINATION: Eval: Observed coordination impairments with affected *** hand. Box and Blocks Test: *** Blocks today (*** is [facility]); 9 Hole Peg Test Right: ***sec, Left: *** sec (*** sec is WFL)   SENSATION: Eval: *** Light touch intact today, though diminished around sx area    EDEMA:   Eval: *** Mildly swollen in *** hand and wrist today, ***cm circumferentially around ***  COGNITION: Eval: Overall cognitive status: WFL for evaluation today ***  OBSERVATIONS:   Eval: ***   TODAY'S TREATMENT:  Post-evaluation treatment: ***   Modalities: {OPRCMODALITIES:31717}  PATIENT EDUCATION: Education details: See tx section above for details  Person educated: Patient Education method: Engineer, structural, Teach back, Handouts  Education comprehension: States and demonstrates understanding, Additional Education required    HOME EXERCISE PROGRAM: See tx section above for details    GOALS: Goals reviewed with patient? Yes   SHORT TERM GOALS: (STG required if  POC>30 days) Target Date: ***  Pt will obtain protective, custom orthotic. Goal status: TBD/PRN,  MET ***  2.  Pt will demo/state understanding of initial HEP to improve pain levels and prerequisite motion. Goal status: INITIAL   LONG TERM GOALS: Target Date: ***  Pt will improve functional ability by decreased impairment per Quick DASH / PSFS / PRWE assessment from *** to *** or better, for better quality of life. Goal  status: INITIAL  2.  Pt will improve grip strength in *** hand from ***lbs to at least ***lbs for functional use at home and in IADLs. Goal status: INITIAL  3.  Pt will improve A/ROM in *** from *** to at least ***, to have functional motion for tasks like reach and grasp.  Goal status: INITIAL  4.  Pt will improve strength in *** from *** MMT to at least *** MMT to have increased functional ability to carry out selfcare and higher-level homecare tasks with less difficulty. Goal status: INITIAL  5.  Pt will improve coordination skills in ***, as seen by within functional limit score on *** testing to have increased functional ability to carry out fine motor tasks (fasteners, etc.) and more complex, coordinated IADLs (meal prep, sports, etc.).  Goal status: INITIAL  6.  Pt will decrease pain at worst from ***/10 to ***/10 or better to have better sleep and occupational participation in daily roles. Goal status: INITIAL   ASSESSMENT:  CLINICAL IMPRESSION: Patient is a 53 y.o. female who was seen today for occupational therapy evaluation for ***.   PERFORMANCE DEFICITS: in functional skills including {OT physical skills:25468}, cognitive skills including {OT cognitive skills:25469}, and psychosocial skills including {OT psychosocial skills:25470}.   IMPAIRMENTS: are limiting patient from {OT performance deficits:25471}.   COMORBIDITIES: {Comorbidities:25485} that affects occupational performance. Patient will benefit from skilled OT to address above impairments  and improve overall function.  MODIFICATION OR ASSISTANCE TO COMPLETE EVALUATION: {OT modification:25474}  OT OCCUPATIONAL PROFILE AND HISTORY: {OT PROFILE AND HISTORY:25484}  CLINICAL DECISION MAKING: {OT CDM:25475}  REHAB POTENTIAL: {rehabpotential:25112}  EVALUATION COMPLEXITY: {Evaluation complexity:25115}      PLAN:  OT FREQUENCY: {rehab frequency:25116}  OT DURATION: {rehab duration:25117}  PLANNED INTERVENTIONS: {OT Interventions:25467}  RECOMMENDED OTHER SERVICES: ***  CONSULTED AND AGREED WITH PLAN OF CARE: {NUU:72536}  PLAN FOR NEXT SESSION: ***   Fannie Knee, OTR/L, CHT 07/08/2023, 1:59 PM

## 2023-07-10 ENCOUNTER — Other Ambulatory Visit (HOSPITAL_COMMUNITY): Payer: Self-pay

## 2023-07-10 ENCOUNTER — Ambulatory Visit (INDEPENDENT_AMBULATORY_CARE_PROVIDER_SITE_OTHER): Payer: 59 | Admitting: Orthopaedic Surgery

## 2023-07-10 ENCOUNTER — Encounter: Payer: Self-pay | Admitting: Orthopaedic Surgery

## 2023-07-10 DIAGNOSIS — G5601 Carpal tunnel syndrome, right upper limb: Secondary | ICD-10-CM | POA: Insufficient documentation

## 2023-07-10 DIAGNOSIS — G5602 Carpal tunnel syndrome, left upper limb: Secondary | ICD-10-CM

## 2023-07-10 NOTE — Progress Notes (Signed)
Office Visit Note   Patient: Mackenzie [name]           Date of Birth: 08/29/1970           MRN: [id number] Visit Date: 07/10/2023              Requested by: Dois Davenport, MD [address] [place],  [place] [address] PCP: Dois Davenport, MD   Assessment & Plan: Visit Diagnoses:  1. Right carpal tunnel syndrome   2. Left carpal tunnel syndrome     Plan: Ms. Kastens is a 53 year old female with moderate bilateral carpal tunnel syndrome worse on the left.  Treatment options were explained.  She will think about her options at this time.  Follow-Up Instructions: No follow-ups on file.   Orders:  No orders of the defined types were placed in this encounter.  No orders of the defined types were placed in this encounter.     Procedures: No procedures performed   Clinical Data: No additional findings.   Subjective: Chief Complaint  Patient presents with   Left Arm - Follow-up    EMG review    HPI Patient returns today to discuss EMGs for carpal tunnel syndrome Review of Systems   Objective: Vital Signs: LMP 11/28/2016   Physical Exam  Ortho Exam Exams of bilateral hands are unchanged from prior visit. Specialty Comments:  No specialty comments available.  Imaging: No results found.   PMFS History: 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   Boil of buttock, Left 12/24/2021   Hyperthyroidism 07/22/2021   Low serum thyroid  stimulating hormone (TSH) 06/12/2021   Other fatigue 05/23/2021   Hemoglobin low 05/23/2021   Polyphagia 05/16/2020   Vitamin D deficiency 05/16/2020   At risk for osteoporosis 05/16/2020   Prediabetes 05/01/2020   Elevated blood pressure reading 05/01/2020   At risk for impaired metabolic function 05/01/2020   Breast asymmetry following reconstructive surgery 09/20/2019   Essential hypertension 08/17/2018   Class 3 severe obesity with serious comorbidity and body mass index (BMI) of 40.0 to 44.9 in adult [facility]) 09/17/2017   Acquired absence of left breast 09/15/2017   Malignant neoplasm of upper-inner quadrant of left breast in female, 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   Healthcare maintenance 03/25/2017   Malignant 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, estrogen receptor positive (HCC) 01/23/2017   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, left (HCC) 01/23/2017   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, left (HCC) 01/23/2017   Cellulitis of right leg    Edema, lower extremity    GERD (gastroesophageal reflux disease)    Healthcare maintenance 03/25/2017  Herpesviral infection    Hot flashes 03/2017   Hyperlipidemia 03/20/2016   Impaired fasting glucose    Morbid obesity with body mass index (BMI) of 40.0 to 49.9 (HCC) 09/17/2017   Neoplasm of cervix    Osteoarthritis of knee    Other chest pain    Other specified disorders of veins    Polyneuropathy    Postmenopausal atrophic vaginitis    Pre-diabetes 07/26/2018   Hgb A1C elevated on 07/26/2018, Gestational Diabetes 2012   Pure hypercholesterolemia 03/30/2023   Seasonal allergies 2012   seasonal allergies  causes allergic rhinitis and itchy, dry eyes per pt   Stress incontinence    Tachycardia    Thyrotoxicosis    Vitamin D deficiency 05/2015    Family History  Problem Relation Age of Onset   Other Mother        d.56 from complications of surgery to remove brain tumor   Diabetes Mother    Hypertension Mother    Stroke Mother    Kidney disease Father    Hypertension Maternal Grandmother    Breast cancer Paternal Grandmother 41       d.60s from breast cancer. Did not have treatment.   Hypertension Other    Diabetes Other    Stroke Other     Past Surgical History:  Procedure Laterality Date   BREAST RECONSTRUCTION WITH PLACEMENT OF TISSUE EXPANDER AND FLEX HD (ACELLULAR HYDRATED DERMIS) Left 08/27/2017   Procedure: LEFT BREAST RECONSTRUCTION WITH PLACEMENT OF TISSUE EXPANDER AND FLEX HD;  Surgeon: Peggye Form, DO;  [facility];  Service: Plastics;  Laterality: Left;   CESAREAN SECTION     x2   KNEE ARTHROSCOPY WITH MEDIAL MENISECTOMY Right 11/12/2022   Procedure: RIGHT KNEE ARTHROSCOPY, PARTIAL MEDIAL MENISCECTOMY;  Surgeon: Tarry Kos, MD;  Location: [facility];  Service: Orthopedics;  Laterality: Right;   LATISSIMUS FLAP TO BREAST Left 11/03/2018   LATISSIMUS FLAP TO BREAST Left 11/03/2018   Procedure: LATISSIMUS FLAP TO LEFT BREAST;  Surgeon: Peggye Form, DO;  [facility];  Service: Plastics;  Laterality: Left;   MASTECTOMY MODIFIED RADICAL Left 08/27/2017   MASTECTOMY MODIFIED RADICAL Left 08/27/2017   Procedure: LEFT MODIFIED RADICAL MASTECTOMY;  Surgeon: Harriette Bouillon, MD;  [facility];  Service: General;  Laterality: Left;   MASTOPEXY Right 03/03/2019   Procedure: RIGHT BREAST MASTOPEXY/REDUCTION;  Surgeon: Peggye Form, DO;  Location: [facility];  Service: Plastics;  Laterality: Right;  3 hours, please   PORT-A-CATH REMOVAL Right 08/27/2017   Procedure: REMOVAL PORT-A-CATH RIGHT CHEST;  Surgeon: Harriette Bouillon, MD;  [facility];  Service: General;  Laterality: Right;   PORTACATH PLACEMENT Right 01/28/2017   Procedure: INSERTION PORT-A-CATH WITH ULTRASOUND;  Surgeon: Harriette Bouillon, MD;  Location: [facility];  Service: General;  Laterality: Right;   REDUCTION MAMMAPLASTY Right    REMOVAL OF TISSUE EXPANDER AND PLACEMENT OF IMPLANT Left 03/03/2019   Procedure: REMOVAL OF TISSUE EXPANDER AND PLACEMENT OF EXPANDER;  Surgeon: Peggye Form, DO;  Location: [facility];  Service: Plastics;  Laterality: Left;   REMOVAL OF TISSUE EXPANDER AND PLACEMENT OF IMPLANT Left 05/25/2019   Procedure: LEFT BREAST REMOVAL OF TISSUE EXPANDER AND PLACEMENT OF IMPLANT;  Surgeon: Peggye Form, DO;  Location: [facility];  Service: Plastics;  Laterality: Left;   TISSUE EXPANDER PLACEMENT Left 11/03/2018   Procedure: PLACEMENT OF TISSUE EXPANDER LEFT BREAST;  Surgeon: Foster Simpson  S, DO;  [facility];  Service: Plastics;  Laterality: Left;  Total case time is 3.5 hours   TUBAL LIGATION Bilateral 01/21/2011   Social History   Occupational History   Occupation: Charity fundraiser  Tobacco Use   Smoking status: Never   Smokeless tobacco: Never  Vaping Use   Vaping status: Never Used  Substance and Sexual Activity   Alcohol use: Yes    Comment: social   Drug use: No   Sexual activity: Not on file    Comment: BTL

## 2023-07-13 ENCOUNTER — Ambulatory Visit: Payer: 59 | Attending: Student

## 2023-07-13 ENCOUNTER — Encounter: Payer: 59 | Admitting: Rehabilitative and Restorative Service Providers"

## 2023-07-13 VITALS — Wt 261.1 lb

## 2023-07-13 DIAGNOSIS — Z483 Aftercare following surgery for neoplasm: Secondary | ICD-10-CM | POA: Insufficient documentation

## 2023-07-13 NOTE — Therapy (Signed)
OUTPATIENT PHYSICAL THERAPY SOZO SCREENING NOTE   Patient Name: Mackenzie [name] MRN: [id number] DOB:1970-10-23, 53 y.o., female Today's Date: 07/13/2023  PCP: Dois Davenport, MD REFERRING PROVIDER: Laurena Spies, PA-C   PT End of Session - 07/13/23 [phone]     Visit Number 4   # unchanged due to screen only   PT Start Time 0950    PT Stop Time 0956    PT Time Calculation (min) 6 min    Activity Tolerance Patient tolerated treatment well    Behavior During Therapy [facility] for tasks assessed/performed             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, left (HCC) 01/23/2017   Cellulitis of right leg    Edema, lower extremity    GERD (gastroesophageal reflux disease)    Healthcare maintenance 03/25/2017   Herpesviral infection    Hot flashes 03/2017   Hyperlipidemia 03/20/2016   Impaired fasting glucose    Morbid obesity with body mass index (BMI) of 40.0 to 49.9 (HCC) 09/17/2017   Neoplasm of cervix    Osteoarthritis of knee    Other chest pain    Other specified disorders of veins    Polyneuropathy    Postmenopausal atrophic vaginitis    Pre-diabetes 07/26/2018   Hgb A1C elevated on 07/26/2018, Gestational Diabetes 2012   Pure hypercholesterolemia 03/30/2023   Seasonal allergies 2012   seasonal allergies causes allergic rhinitis and itchy, dry eyes per pt   Stress incontinence    Tachycardia    Thyrotoxicosis    Vitamin D deficiency 05/2015   Past Surgical History:  Procedure Laterality Date   BREAST RECONSTRUCTION WITH PLACEMENT OF TISSUE EXPANDER AND FLEX HD (ACELLULAR HYDRATED DERMIS) Left 08/27/2017   Procedure: LEFT BREAST RECONSTRUCTION WITH PLACEMENT OF TISSUE EXPANDER AND  FLEX HD;  Surgeon: Peggye Form, DO;  Location: [facility];  Service: Plastics;  Laterality: Left;   CESAREAN SECTION     x2   KNEE ARTHROSCOPY WITH MEDIAL MENISECTOMY Right 11/12/2022   Procedure: RIGHT KNEE ARTHROSCOPY, PARTIAL MEDIAL MENISCECTOMY;  Surgeon: Tarry Kos, MD;  Location: [facility];  Service: Orthopedics;  Laterality: Right;   LATISSIMUS FLAP TO BREAST Left 11/03/2018   LATISSIMUS FLAP TO BREAST Left 11/03/2018   Procedure: LATISSIMUS FLAP TO LEFT BREAST;  Surgeon: Peggye Form, DO;  Location: [facility];  Service: Plastics;  Laterality: Left;   MASTECTOMY MODIFIED RADICAL Left 08/27/2017   MASTECTOMY MODIFIED RADICAL Left 08/27/2017   Procedure: LEFT MODIFIED RADICAL MASTECTOMY;  Surgeon: Harriette Bouillon, MD;  Location: [facility];  Service: General;  Laterality: Left;   MASTOPEXY Right 03/03/2019   Procedure: RIGHT BREAST MASTOPEXY/REDUCTION;  Surgeon: Peggye Form, DO;  Location: [facility];  Service: Plastics;  Laterality: Right;  3 hours, please   PORT-A-CATH REMOVAL Right 08/27/2017   Procedure: REMOVAL PORT-A-CATH RIGHT CHEST;  Surgeon: Harriette Bouillon, MD;  Location: [facility];  Service: General;  Laterality: Right;   PORTACATH PLACEMENT Right 01/28/2017   Procedure: INSERTION PORT-A-CATH WITH ULTRASOUND;  Surgeon: Harriette Bouillon, MD;  Location: [facility];  Service: General;  Laterality: Right;   REDUCTION MAMMAPLASTY  Right    REMOVAL OF TISSUE EXPANDER AND PLACEMENT OF IMPLANT Left 03/03/2019   Procedure: REMOVAL OF TISSUE EXPANDER AND PLACEMENT OF EXPANDER;  Surgeon: Peggye Form, DO;  Location: [facility];  Service: Plastics;  Laterality: Left;   REMOVAL OF TISSUE EXPANDER AND PLACEMENT OF IMPLANT Left 05/25/2019   Procedure: LEFT BREAST REMOVAL OF TISSUE EXPANDER AND PLACEMENT OF IMPLANT;  Surgeon: Peggye Form, DO;  Location: [facility];  Service: Plastics;  Laterality: Left;    TISSUE EXPANDER PLACEMENT Left 11/03/2018   Procedure: PLACEMENT OF TISSUE EXPANDER LEFT BREAST;  Surgeon: Peggye Form, DO;  Location: [facility];  Service: Plastics;  Laterality: Left;  Total case time is 3.5 hours   TUBAL LIGATION Bilateral 01/21/2011   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   Boil of buttock, Left 12/24/2021   Hyperthyroidism 07/22/2021   Low serum thyroid stimulating hormone (TSH) 06/12/2021   Other fatigue 05/23/2021   Hemoglobin low 05/23/2021   Polyphagia 05/16/2020   Vitamin D deficiency 05/16/2020   At risk for osteoporosis 05/16/2020   Prediabetes 05/01/2020   Elevated blood pressure reading 05/01/2020   At risk for impaired metabolic function 05/01/2020   Breast asymmetry following reconstructive surgery 09/20/2019   Essential hypertension 08/17/2018   Class 3 severe obesity with serious comorbidity and body mass index (BMI) of 40.0 to 44.9 in adult [facility]) 09/17/2017   Acquired absence of left breast 09/15/2017   Malignant neoplasm of upper-inner quadrant of left breast in female, 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 Then had neo-adjuvant chemo starting 02/10/17 until 06/26/17; had left mastectomy 08/27/17 with ALND and immediate expander placed. Radiation completed. Expander removed and replaced on May 27, 19 b/c it did not get expanded enough prior to radiation, also had lat flap 11/03/18. Had expander removed and replaced again on 03/03/19 due to a small leak, pt had reduction on R breast at this time   PRECAUTIONS: left UE Lymphedema risk, None  SUBJECTIVE: Pt returns for her 3 month L-Dex screen. "My Rt achilles has been bothering me probably from my knee surgery. I'm working on trying to lose weight."   PAIN:  Are you having pain? No and Yes: NPRS scale: 6-7/10 Pain location: Rt achilles Pain description: stiff and tight Aggravating factors: maybe because of knee surgery last summer Relieving factors: not sure, trying to stretch all the time  SOZO SCREENING: Patient was assessed today using the SOZO machine to determine the lymphedema index score. This was compared to her baseline score. It was determined that she is within the  recommended range when compared to her baseline and no further action is needed at this time. She will continue SOZO screenings. These are done every 3 months for 2 years post operatively followed by every 6 months for 2 years, and then annually.     L-DEX FLOWSHEETS - 07/13/23 0900       L-DEX LYMPHEDEMA SCREENING   Measurement Type Unilateral    L-DEX MEASUREMENT EXTREMITY Upper Extremity    POSITION  Standing    DOMINANT SIDE Right    At Risk Side Left    BASELINE SCORE (UNILATERAL) 5.3    L-DEX SCORE (UNILATERAL) 3.2    VALUE CHANGE (UNILAT) -2.1             P: Begin 6 month L-Dex screen next and then may switch to annual as her initial surgery was in 2019.  Hermenia Bers, PTA 07/13/2023, 9:56 AM

## 2023-07-14 ENCOUNTER — Encounter: Payer: Self-pay | Admitting: Physical Therapy

## 2023-07-14 ENCOUNTER — Ambulatory Visit: Payer: 59 | Admitting: Physical Therapy

## 2023-07-14 DIAGNOSIS — M25561 Pain in right knee: Secondary | ICD-10-CM | POA: Diagnosis not present

## 2023-07-14 DIAGNOSIS — M25671 Stiffness of right ankle, not elsewhere classified: Secondary | ICD-10-CM

## 2023-07-14 DIAGNOSIS — M25571 Pain in right ankle and joints of right foot: Secondary | ICD-10-CM

## 2023-07-14 DIAGNOSIS — R2689 Other abnormalities of gait and mobility: Secondary | ICD-10-CM | POA: Diagnosis not present

## 2023-07-14 DIAGNOSIS — M6281 Muscle weakness (generalized): Secondary | ICD-10-CM | POA: Diagnosis not present

## 2023-07-14 DIAGNOSIS — G8929 Other chronic pain: Secondary | ICD-10-CM

## 2023-07-14 DIAGNOSIS — M25661 Stiffness of right knee, not elsewhere classified: Secondary | ICD-10-CM | POA: Diagnosis not present

## 2023-07-14 NOTE — Therapy (Addendum)
 OUTPATIENT PHYSICAL THERAPY LOWER EXTREMITY TREATMENT   Patient Name: Mackenzie [name] MRN: [id number] DOB:[birth year], 53 y.o., female Today's Date: 07/14/2023  END OF SESSION:  PT End of Session - 07/14/23 0844     Visit Number 5    Number of Visits 7    Date for PT Re-Evaluation 07/23/23    Authorization Type [facility] [employer], deductible met    Progress Note Due on Visit 20    PT Start Time 0845    PT Stop Time 0927    PT Time Calculation (min) 42 min    Activity Tolerance Patient tolerated treatment well;Patient limited by pain    Behavior During Therapy [facility] for tasks assessed/performed                 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, left (HCC) 01/23/2017   Cellulitis of right leg    Edema, lower extremity    GERD (gastroesophageal reflux disease)    Healthcare maintenance 03/25/2017   Herpesviral infection    Hot flashes 03/2017   Hyperlipidemia 03/20/2016   Impaired fasting glucose    Morbid obesity with body mass index (BMI) of 40.0 to 49.9 (HCC) 09/17/2017   Neoplasm of cervix    Osteoarthritis of knee    Other chest pain    Other specified disorders of veins    Polyneuropathy    Postmenopausal atrophic vaginitis    Pre-diabetes 07/26/2018   Hgb A1C elevated on 07/26/2018, Gestational Diabetes 2012   Pure hypercholesterolemia 03/30/2023   Seasonal allergies 2012   seasonal allergies causes allergic rhinitis and itchy, dry eyes per pt   Stress incontinence    Tachycardia    Thyrotoxicosis    Vitamin D  deficiency 05/2015   Past Surgical History:  Procedure Laterality Date   BREAST RECONSTRUCTION WITH PLACEMENT OF TISSUE EXPANDER AND FLEX HD (ACELLULAR HYDRATED DERMIS)  Left 08/27/2017   Procedure: LEFT BREAST RECONSTRUCTION WITH PLACEMENT OF TISSUE EXPANDER AND FLEX HD;  Surgeon: Lowery Estefana RAMAN, DO;  Location: [facility];  Service: Plastics;  Laterality: Left;   CESAREAN SECTION     x2   KNEE ARTHROSCOPY WITH MEDIAL MENISECTOMY Right 11/12/2022   Procedure: RIGHT KNEE ARTHROSCOPY, PARTIAL MEDIAL MENISCECTOMY;  Surgeon: Jerri Kay HERO, MD;  Location: [facility];  Service: Orthopedics;  Laterality: Right;   LATISSIMUS FLAP TO BREAST Left 11/03/2018   LATISSIMUS FLAP TO BREAST Left 11/03/2018   Procedure: LATISSIMUS FLAP TO LEFT BREAST;  Surgeon: Lowery Estefana RAMAN, DO;  Location: [facility];  Service: Plastics;  Laterality: Left;   MASTECTOMY MODIFIED RADICAL Left 08/27/2017   MASTECTOMY MODIFIED RADICAL Left 08/27/2017   Procedure: LEFT MODIFIED RADICAL MASTECTOMY;  Surgeon: Vanderbilt Ned, MD;  Location: [facility];  Service: General;  Laterality: Left;   MASTOPEXY Right 03/03/2019   Procedure: RIGHT BREAST MASTOPEXY/REDUCTION;  Surgeon: Lowery Estefana RAMAN, DO;  Location: [facility];  Service: Plastics;  Laterality: Right;  3 hours, please   PORT-A-CATH REMOVAL Right 08/27/2017   Procedure: REMOVAL PORT-A-CATH RIGHT CHEST;  Surgeon: Vanderbilt Ned, MD;  Location: [facility];  Service: General;  Laterality: Right;   PORTACATH PLACEMENT Right 01/28/2017   Procedure: INSERTION  PORT-A-CATH WITH ULTRASOUND;  Surgeon: Vanderbilt Ned, MD;  Location: [facility];  Service: General;  Laterality: Right;   REDUCTION MAMMAPLASTY Right    REMOVAL OF TISSUE EXPANDER AND PLACEMENT OF IMPLANT Left 03/03/2019   Procedure: REMOVAL OF TISSUE EXPANDER AND PLACEMENT OF EXPANDER;  Surgeon: Lowery Estefana RAMAN, DO;  Location: [facility];  Service: Plastics;  Laterality: Left;   REMOVAL OF TISSUE EXPANDER AND PLACEMENT OF IMPLANT Left 05/25/2019   Procedure: LEFT BREAST REMOVAL OF TISSUE EXPANDER AND PLACEMENT OF IMPLANT;  Surgeon:  Lowery Estefana RAMAN, DO;  Location: [facility];  Service: Plastics;  Laterality: Left;   TISSUE EXPANDER PLACEMENT Left 11/03/2018   Procedure: PLACEMENT OF TISSUE EXPANDER LEFT BREAST;  Surgeon: Lowery Estefana RAMAN, DO;  Location: [facility];  Service: Plastics;  Laterality: Left;  Total case time is 3.5 hours   TUBAL LIGATION Bilateral 01/21/2011   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   Boil of buttock, Left 12/24/2021   Hyperthyroidism 07/22/2021   Low serum thyroid  stimulating hormone (TSH) 06/12/2021   Other fatigue 05/23/2021   Hemoglobin low 05/23/2021   Polyphagia 05/16/2020   Vitamin D  deficiency 05/16/2020   At risk for osteoporosis 05/16/2020   Prediabetes 05/01/2020   Elevated blood pressure reading 05/01/2020   At risk for impaired metabolic function 05/01/2020   Breast asymmetry following reconstructive surgery 09/20/2019   Essential hypertension 08/17/2018   Class 3 severe obesity with serious comorbidity and body mass index (BMI) of 40.0 to 44.9 in adult [facility]) 09/17/2017   Acquired absence of left breast 09/15/2017   Malignant neoplasm of upper-inner quadrant of left breast in female, 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   Healthcare maintenance 03/25/2017   Malignant 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, estrogen receptor positive (HCC) 01/23/2017   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, left (HCC) 01/23/2017     PCP: [name], MD  REFERRING PROVIDER: Jule Ronal CROME, PA-C  REFERRING DIAG: M17.11 (ICD-10-CM) - Unilateral primary osteoarthritis, right knee   THERAPY DIAG:  Chronic pain of right knee  Pain in right ankle and joints of right foot  Muscle weakness (generalized)  Other abnormalities of gait and mobility  Stiffness of right ankle, not elsewhere classified  Stiffness of right knee, not elsewhere classified  Rationale for Evaluation and Treatment: Rehabilitation  ONSET DATE: 05/26/2023 referral to PT  SUBJECTIVE:   SUBJECTIVE STATEMENT: Ambulates in with an antalgic gait pattern stating that she does not know if that is due to new shoes she's wearing or if it was from walking on it a lot yesterday. Does state that she is having pain in her knee and was not able to stretch this morning.   PERTINENT HISTORY: Right knee arthroscopy 11/12/22, History of Lt breast cancer, GERD, hyperlipidemia,   PAIN:  NPRS scale: at start of session 7/10, worse pain 7/10 in knee, 8-9/10 achilles Pain location: right knee more medial/anterior, and Achilles tendon Pain description: knee  at times sharp / throbbing, general dull ache;  Achilles is pulling, sharp stretch Aggravating factors: walking esp stairs, getting up when stiff,  Relieving factors: meds, elevate, rest  PRECAUTIONS: NO BP LUE  WEIGHT BEARING RESTRICTIONS: No  FALLS:  Has patient fallen in last 6 months? No  LIVING ENVIRONMENT: Lives with: lives with their daughter 12yo Lives in: House Stairs: Yes: External: 3 steps; none;  sunken den single step no rail.   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   Next MD visit:  not scheduled  OBJECTIVE:  DIAGNOSTIC FINDINGS: 04/21/2023 right ankle X-ray showed  Retrocalcaneal osteophyte formation  PATIENT SURVEYS:  FOTO intake:  52%  predicted:  64%  COGNITION: Overall cognitive status: WFL    SENSATION: WFL  PALPATION: Right knee tenderness to palpation medial joint line and quadriceps tendon.  Denies tenderness to Quad muscle, Patella Tendon or other (not medial) areas of joint line.  Right ankle tenderness to palpation Achilles Tendon. Denies tenderness to Gastroc belly, Plantar Fascia or with Great Toe extension.   LOWER EXTREMITY ROM:   ROM Right eval Right 06/29/23 Rt 07/07/23 supine  Hip flexion     Hip extension     Hip abduction     Hip adduction     Hip internal rotation     Hip external rotation     Knee flexion Supine A: 105* P: 112* with pain medial knee / distal quad Supine P: 113* A: 120 P; 122  Knee extension Supine A: quad set -5* with pain in proximal gastroc Supine P: 0* A: 0  Ankle dorsiflexion Supine with Knee extended  A: +9* P: +10* Knee pain/none Achielles Supine with knee flexed A: 16* P: 20* Standing DF with knee ext P: +19* AA: 10  Ankle plantarflexion Supine A: 21* P: 27* Knee pain/none Achielles    Ankle inversion     Ankle eversion      (Blank rows = not tested)  LOWER EXTREMITY MMT:  MMT Right eval Left eval  Hip flexion    Hip extension    Hip abduction    Hip adduction    Hip internal rotation    Hip external rotation    Knee flexion 4/5 4+  Knee extension 4/5 4+  Ankle dorsiflexion    Ankle plantarflexion 3-/5 3+ (tested standing with calf raise)  Ankle inversion    Ankle eversion     (Blank rows = not tested)  FUNCTIONAL TESTS:  18 inch chair transfer: able to arise without UE assist but weight shifts to left 5 times sit to stand: 16.53 sec Lt SLS: 1st attempt 3.96 sec 2nd attempt 11.84 sec Rt SLS: 1st attempt 7.00 sec 2nd attempt 12.89 sec  07/07/23:  Rt SLS: 1st attempt 13.2 seconds, 2nd attempt: 23.7, 3rd attempt: 33.9 seconds Left SLS: 1st attempt 30.1 seconds 5  times sit to stand: 11.6 sec  GAIT: Distance walked: 100'  Assistive device utilized: Single point cane Level of assistance: SBA Comments: antalgic with decreased stance duration RLE, right knee flexed in stance, early heel rise in terminal stance with little to no push-off noted.    TODAY'S TREATMENT  DATE: 07/14/2023:  Therex: Recumbent bike: level 4 x 9 minutes Heel drop off stretch 2x30sec; pt had increased pain with activity instructed on only going as far into stretch as tolerated. Seated gastroc + plantar fascia stretch with towel 3x30sec  Therapeutic Activities: 2 flights of stair negotiation with step to pattern with affected and non affected leg, step through pattern, and use of bilateral and single railings. Required demonstration and verbal cues for proper form. Patient educated on protection of leg vs using it in order to strengthen the leg for tolerance   Backwards walking x2 bouts: initial steps were unsteady however once comfortable patient demonstrated increased body awareness and improved posture/mechanics    TODAY'S TREATMENT                                                                          DATE: 07/07/2023:  Recumbent bike: level 4 x 8 minutes IT band stretch: x 2 holding 30 sec Hamstring stretch x 2 holding 30 sec Calf raises: bil up, lowering with Rt only 2 x 10  Leg Press: 100# bil 2 x 10 single leg 50# 2 x 10   NMR:  SLS see scores above Sit to stand: 2 x 5 see scores above Tandem stance: 30 seconds Up and down 1 flight stairs Rt hand rail (pain 5/10 in Rt knee and ankle)  TODAY'S TREATMENT                                                                          DATE: 06/29/2023:  Therapeutic Exercise: Bike seat 6 for 8 min level 3. 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   Gastroc stretch RLE on step heel depression 30  sec hold 3 reps with 10 heel raises 2 sets bw stretches  Leg press BLEs 100# 15 reps RLE only 50# 10 reps PT reviewed need for HEP to include flexibility, strength, endurance (muscle) and balance.  Pt verbalized understanding.   Neuromuscular Reeducation: Standing on foam alternating LEs 10 reps ea flexion, abduction & extension. Standing on foam alternating bw DF & PF 10 reps. Braiding 3 laps first with UE support & 2 laps without UE support     TREATMENT                                                                          DATE: 06/23/2023:  Therapeutic Exercise: PT reviewed HEP for 1st session. Pt performed  BLEs hip flexor stretch 30 sec hold1 rep, then quad stretch 30 sec hold 2 reps Supine BLEs hamstring stretch 30 sec hold 1 rep, then ITB stretch with strap  30 sec hold 1 rep, hip adductor 30 sec 1 rep Sidelying clam shell with green theraband 5 sec hold 10 reps Squat over chair with good form 5 reps. Bike seat 6 for 8 min level 1.  PT educated on set up and recommended 2-3 sets 10 min with timed rest and whining down rest until riding 30 min.  PT updated HEP (see below) with HO, verbal & demo cues.  Pt verbalized understanding.   PATIENT EDUCATION:  Access Code: [email or web address] URL: [email or web address] Date: 06/29/2023 Prepared by: Grayce Spatz  Exercises - Seated Hamstring Stretch  - 1 x daily - 7 x weekly - 2-3 reps - 30 seconds hold - Gastroc Stretch on Step  - 1 x daily - 7 x weekly - 1 sets - 2-3 reps - 30 seconds hold - standing calf stretch with forefoot on small step or brick  - 1 x daily - 7 x weekly - 1 sets - 2-3 reps - 30 seconds hold - Standing Soleus Stretch on Foam 1/2 Roll  - 1 x daily - 7 x weekly - 1 sets - 2-3 reps - 30 seconds hold - Modified Thomas Stretch  - 1 x daily - 7 x weekly - 1 sets - 2-3 reps - 30 seconds hold - Supine Quadriceps Stretch with Strap on Table  - 1 x daily - 7 x weekly - 1 sets - 2-3 reps - 30 seconds hold - Hooklying  Hamstring Stretch with Strap  - 1 x daily - 7 x weekly - 1 sets - 2-3 reps - 30 seconds hold - Supine ITB Stretch with Strap  - 1 x daily - 7 x weekly - 1 sets - 2-3 reps - 30 seconds hold - Hip Adductors and Hamstring Stretch with Strap  - 1 x daily - 7 x weekly - 1 sets - 2-3 reps - 30 seconds hold - Supine Bridge with Mini Swiss Ball Between Knees  - 1 x daily - 7 x weekly - 2 sets - 10 reps - 5 seconds hold - Clamshell with Resistance  - 1 x daily - 7 x weekly - 4 sets - 10 reps - Supine Active Straight Leg Raise  - 1 x daily - 7 x weekly - 4 sets - 10 reps - 2-3 seconds hold - Seated Straight Leg Raise  - 1 x daily - 7 x weekly - 3 sets - 10 reps - Squat with Chair Touch  - 1 x daily - 7 x weekly - 10 reps - standing alternating legs hip abduction on foam pad  - 1 x daily - 5 x weekly - 1 sets - 10 reps - 5 seconds hold - standing alternating legs hip extension on foam pad  - 1 x daily - 5 x weekly - 1 sets - 10 reps - 5 seconds hold - standing alternating legs hip flexion on foam pad  - 1 x daily - 5 x weekly - 1 sets - 10 reps - 5 seconds hold - Carioca with Counter Support  - 1 x daily - 7 x weekly - 1 sets - 15 reps  ASSESSMENT: CLINICAL IMPRESSION: Patient continues to have achilles pain with only relief found when doing stretching seated. Patient educated on stretching to tolerance not to specific degree of DF. Patient was demonstrated multiple star negotiation strategies safely and was encouraged and educated about increasing amount of attempts at stair negotiation throughout the day/at work. Patient continues to progress and will benefit from  continued skilled physical therapy to address pain and deficits.     OBJECTIVE IMPAIRMENTS: Abnormal gait, decreased activity tolerance, decreased balance, decreased endurance, decreased knowledge of condition, decreased knowledge of use of DME, decreased mobility, difficulty walking, decreased ROM, decreased strength, impaired flexibility,  obesity, and pain.   ACTIVITY LIMITATIONS: carrying, lifting, bending, sitting, standing, squatting, sleeping, stairs, transfers, and locomotion level  PARTICIPATION LIMITATIONS: meal prep, cleaning, community activity, and occupation  PERSONAL FACTORS: Fitness, Past/current experiences, Time since onset of injury/illness/exacerbation, and 3+ comorbidities: see PMH  are also affecting patient's functional outcome.   REHAB POTENTIAL: Good  CLINICAL DECISION MAKING: Stable/uncomplicated  EVALUATION COMPLEXITY: Moderate   GOALS: Goals reviewed with patient? Yes  SHORT TERM GOALS: (target date for Short term goals 06/29/2023)   1.  Patient will demonstrate independent use of home exercise program to maintain progress from in clinic treatments. Baseline: See objective data Goal status: MET 06/29/2023  2. PROM right knee 0* - 110* Baseline: See objective data Goal status: MET 06/29/2023  3. PROM right ankle Dorsiflexion +15* with knee extended Baseline: See objective data Goal status: MET 06/29/2023  LONG TERM GOALS: (target dates for all long term goals  08/06/2023 )   1. Patient will demonstrate/report pain at worst less than or equal to 2/10 to facilitate minimal limitation in daily activity secondary to pain symptoms. Baseline: See objective data Goal status: Ongoing 07/07/2023   2. Patient will demonstrate independent use of home exercise program to facilitate ability to maintain/progress functional gains from skilled physical therapy services. Baseline: See objective data Goal status: Ongoing 07/07/2023   3. Patient will demonstrate FOTO outcome > or = 64 % to indicate reduced disability due to condition. Baseline: See objective data Goal status: Ongoing 06/23/2023   4.  Patient will demonstrate right knee and ankle LE MMT 5/5 throughout to faciltiate usual transfers, stairs, squatting at [facility] for daily life.  Baseline: See objective data Goal status: Ongoing 07/07/2023   5.   Patient able to negotiate stairs with single rail alternating pattern modified independent with knee pain </= 2/10. Baseline: See objective data Goal status:   Partially met  06/23/2023    PLAN:  PT FREQUENCY:  1x/week  PT DURATION: 6 weeks  PLANNED INTERVENTIONS: 97164- PT Re-evaluation, 97110-Therapeutic exercises, 97530- Therapeutic activity, 97112- Neuromuscular re-education, 97535- Self Care, [id number]- Manual therapy, [id number]- Gait training, Patient/Family education, Balance training, Stair training, Taping, Dry Needling, Joint mobilization, DME instructions, Cryotherapy, and Moist heat  PLAN FOR NEXT SESSION:  continue work towards long term goals, continue stair training and stretching of gastroc with attention to achilles pain.    Advay Volante, [place] Pechacek, Student-PT 07/14/2023, 1:48 PM  This entire session of physical therapy was performed under the direct supervision of PT signing evaluation /treatment. PT reviewed note and agrees.   Robin Waldron, PT, DPT 07/14/2023, 4:21 PM

## 2023-07-15 ENCOUNTER — Telehealth: Payer: Self-pay

## 2023-07-15 NOTE — Telephone Encounter (Signed)
 Completed PA form has been faxed to [employer] at [phone] for [employer], left knee. PA pending

## 2023-07-16 ENCOUNTER — Encounter: Payer: Self-pay | Admitting: Internal Medicine

## 2023-07-16 ENCOUNTER — Ambulatory Visit: Payer: 59 | Admitting: Internal Medicine

## 2023-07-16 VITALS — BP 122/80 | HR 82 | Ht 65.0 in | Wt 266.0 lb

## 2023-07-16 DIAGNOSIS — E042 Nontoxic multinodular goiter: Secondary | ICD-10-CM | POA: Diagnosis not present

## 2023-07-16 DIAGNOSIS — E0590 Thyrotoxicosis, unspecified without thyrotoxic crisis or storm: Secondary | ICD-10-CM | POA: Diagnosis not present

## 2023-07-16 DIAGNOSIS — E0500 Thyrotoxicosis with diffuse goiter without thyrotoxic crisis or storm: Secondary | ICD-10-CM | POA: Insufficient documentation

## 2023-07-16 LAB — T3, FREE: T3, Free: 3 pg/mL (ref 2.3–4.2)

## 2023-07-16 LAB — T4, FREE: Free T4: 1.5 ng/dL (ref 0.8–1.8)

## 2023-07-16 LAB — TSH: TSH: 1.39 m[IU]/L

## 2023-07-16 NOTE — Progress Notes (Signed)
 Name: Mackenzie [name]  MRN/ DOB: [id number], 1970/12/30    Age/ Sex: 53 y.o., female    PCP: [name], MD   Reason for Endocrinology Evaluation:  Hyperthyroidism     Date of Initial Endocrinology Evaluation: 07/22/2021    HPI: [name] is a 53 y.o. female with a past medical history of HTN, hx of  left breast Ca . The patient presented for initial endocrinology clinic visit on 07/22/2021 for consultative assistance with her  hyperthyroidism.   Pt has been diagnosed with hyperthyroidism in 05/2021 with a suppressed TSH <0.005 uIU/mL and elevated T4  and T3 at 5.96 and 417 ng/dL respectively.    She has no hx of osteoporosis  Of note, she is s/p left mastectomy due to breast ca, followed by chemo and radiation    Methimazole  was started 07/2021 with a suppressed TSH<0.005u IU/mL, elevated free T4 at 5.96 NG/DL and elevated T3 at 582 NG/DL TRAb was elevated at 9.2 IU/L  No fh of thyroid  disease   Thyroid  ultrasound 01/2023 showed multiple thyroid  nodules    SUBJECTIVE:    Today (07/16/23): [name] is here for follow-up on hyperthyroidism.     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, left (HCC) 01/23/2017   Cellulitis of right leg    Edema, lower extremity    GERD (gastroesophageal reflux disease)    Healthcare maintenance 03/25/2017   Herpesviral infection     Hot flashes 03/2017   Hyperlipidemia 03/20/2016   Impaired fasting glucose    Morbid obesity with body mass index (BMI) of 40.0 to 49.9 (HCC) 09/17/2017   Neoplasm of cervix    Osteoarthritis of knee    Other chest pain    Other specified disorders of veins    Polyneuropathy    Postmenopausal atrophic vaginitis    Pre-diabetes 07/26/2018   Hgb A1C elevated on 07/26/2018, Gestational Diabetes 2012   Pure hypercholesterolemia 03/30/2023   Seasonal allergies 2012   seasonal allergies causes allergic rhinitis and itchy, dry eyes per pt   Stress incontinence    Tachycardia    Thyrotoxicosis    Vitamin D  deficiency 05/2015   Past Surgical History:  Past Surgical History:  Procedure Laterality Date   BREAST RECONSTRUCTION WITH PLACEMENT OF TISSUE EXPANDER AND FLEX HD (ACELLULAR HYDRATED DERMIS) Left 08/27/2017   Procedure: LEFT BREAST RECONSTRUCTION WITH PLACEMENT OF TISSUE EXPANDER AND FLEX HD;  Surgeon: Lowery Estefana RAMAN, DO;  Location: [facility];  Service: Plastics;  Laterality: Left;   CESAREAN SECTION     x2   KNEE ARTHROSCOPY WITH MEDIAL MENISECTOMY Right 11/12/2022   Procedure: RIGHT KNEE ARTHROSCOPY, PARTIAL MEDIAL MENISCECTOMY;  Surgeon: Jerri Kay HERO, MD;  Location: [facility];  Service: Orthopedics;  Laterality: Right;   LATISSIMUS FLAP TO BREAST Left 11/03/2018   LATISSIMUS FLAP TO  BREAST Left 11/03/2018   Procedure: LATISSIMUS FLAP TO LEFT BREAST;  Surgeon: Lowery Estefana RAMAN, DO;  Location: [facility];  Service: Plastics;  Laterality: Left;   MASTECTOMY MODIFIED RADICAL Left 08/27/2017   MASTECTOMY MODIFIED RADICAL Left 08/27/2017   Procedure: LEFT MODIFIED RADICAL MASTECTOMY;  Surgeon: Vanderbilt Ned, MD;  Location: [facility];  Service: General;  Laterality: Left;   MASTOPEXY Right 03/03/2019   Procedure: RIGHT BREAST MASTOPEXY/REDUCTION;  Surgeon: Lowery Estefana RAMAN, DO;  Location: [facility];  Service: Plastics;  Laterality: Right;  3 hours, please    PORT-A-CATH REMOVAL Right 08/27/2017   Procedure: REMOVAL PORT-A-CATH RIGHT CHEST;  Surgeon: Vanderbilt Ned, MD;  Location: [facility];  Service: General;  Laterality: Right;   PORTACATH PLACEMENT Right 01/28/2017   Procedure: INSERTION PORT-A-CATH WITH ULTRASOUND;  Surgeon: Vanderbilt Ned, MD;  Location: [facility];  Service: General;  Laterality: Right;   REDUCTION MAMMAPLASTY Right    REMOVAL OF TISSUE EXPANDER AND PLACEMENT OF IMPLANT Left 03/03/2019   Procedure: REMOVAL OF TISSUE EXPANDER AND PLACEMENT OF EXPANDER;  Surgeon: Lowery Estefana RAMAN, DO;  Location: [facility];  Service: Plastics;  Laterality: Left;   REMOVAL OF TISSUE EXPANDER AND PLACEMENT OF IMPLANT Left 05/25/2019   Procedure: LEFT BREAST REMOVAL OF TISSUE EXPANDER AND PLACEMENT OF IMPLANT;  Surgeon: Lowery Estefana RAMAN, DO;  Location: [facility];  Service: Plastics;  Laterality: Left;   TISSUE EXPANDER PLACEMENT Left 11/03/2018   Procedure: PLACEMENT OF TISSUE EXPANDER LEFT BREAST;  Surgeon: Lowery Estefana RAMAN, DO;  Location: [facility];  Service: Plastics;  Laterality: Left;  Total case time is 3.5 hours   TUBAL LIGATION Bilateral 01/21/2011    Social History:  reports that she has never smoked. She has never used smokeless tobacco. She reports current alcohol use. She reports that she does not use drugs. Family History: family history includes Breast cancer (age of onset: 68) in her paternal grandmother; Diabetes in her mother and another family member; Hypertension in her maternal grandmother, mother, and another family member; Kidney disease in her father; Other in her mother; Stroke in her mother and another family member.   HOME MEDICATIONS: Allergies as of 07/16/2023       Reactions   Dilaudid  [hydromorphone ] Itching        Medication List        Accurate as of July 16, 2023  9:04 AM. If you have any questions, ask your nurse or doctor.          acetaminophen  500 MG  tablet Commonly known as: TYLENOL  Take 1,000 mg by mouth every 6 (six) hours as needed (for pain/headaches.).   buPROPion  200 MG 12 hr tablet Commonly known as: Wellbutrin  SR Take 1 tablet (200 mg total) by mouth daily.   calcium  carbonate 500 MG chewable tablet Commonly known as: TUMS - dosed in mg elemental calcium  Chew 1 tablet by mouth 2 (two) times daily.   cetirizine 10 MG tablet Commonly known as: ZYRTEC Take 10 mg by mouth daily.   cyclobenzaprine  10 MG tablet Commonly known as: FLEXERIL  Take 1/2 tablet (5 mg total) by mouth 2 (two) times daily as needed.   diclofenac  75 MG EC tablet Commonly known as: VOLTAREN  Take 1 tablet (75 mg total) by mouth 2 (two) times daily as needed.   Fish Oil 1000 MG Caps Take 1,000 mg by mouth 2 (two) times daily.   gabapentin  300 MG capsule Commonly known as: NEURONTIN  Take 1 capsule (  300 mg total) by mouth 3 (three) times daily.   gabapentin  300 MG capsule Commonly known as: NEURONTIN  Take 1 capsule (300 mg total) by mouth 3 (three) times daily.   HYDROcodone -acetaminophen  5-325 MG tablet Commonly known as: Norco Take 1 tablet by mouth every 6 (six) hours as needed.   ibuprofen  800 MG tablet Commonly known as: ADVIL  Take 800 mg by mouth every 8 (eight) hours as needed (pain).   letrozole  2.5 MG tablet Commonly known as: FEMARA  Take 1 tablet (2.5 mg total) by mouth daily.   loratadine  10 MG tablet Commonly known as: Claritin  Take 1 tablet (10 mg total) by mouth daily. What changed:  when to take this reasons to take this   losartan  50 MG tablet Commonly known as: COZAAR  Take 1 tablet (50 mg total) by mouth daily.   losartan  50 MG tablet Commonly known as: COZAAR  Take 1 tablet (50 mg total) by mouth daily.   metFORMIN  500 MG tablet Commonly known as: GLUCOPHAGE  Take 1 tablet (500 mg total) by mouth 2 (two) times daily with a meal.   methimazole  5 MG tablet Commonly known as: TAPAZOLE  Take 1 tablet (5 mg total)  by mouth daily in the afternoon.   metoprolol  succinate 50 MG 24 hr tablet Commonly known as: TOPROL -XL Take 1 tablet (50 mg total) by mouth daily. What changed: how much to take   metoprolol  succinate 50 MG 24 hr tablet Commonly known as: TOPROL -XL Take 1/2 tablet (25 mg total) by mouth daily. What changed: Another medication with the same name was changed. Make sure you understand how and when to take each.   montelukast  10 MG tablet Commonly known as: SINGULAIR  Take 1 tablet (10 mg total) by mouth at bedtime.   multivitamin with minerals Tabs tablet Take 1 tablet by mouth daily.   omeprazole  40 MG capsule Commonly known as: PRILOSEC Take 1 capsule (40 mg total) by mouth daily.   omeprazole  40 MG capsule Commonly known as: PRILOSEC Take 1 capsule (40 mg total) by mouth daily 1/2 to 1 hour before morning meal   ondansetron  4 MG tablet Commonly known as: Zofran  Take 1 tablet (4 mg total) by mouth every 8 (eight) hours as needed for nausea or vomiting.   PROBIOTIC DAILY PO Take 1 capsule by mouth 2 (two) times daily.   rosuvastatin  10 MG tablet Commonly known as: CRESTOR  Take 1 tablet (10 mg total) by mouth daily.   rosuvastatin  20 MG tablet Commonly known as: CRESTOR  Take 1 tablet by mouth daily   rosuvastatin  20 MG tablet Commonly known as: CRESTOR  Take 1 tablet (20 mg total) by mouth daily.   spironolactone  25 MG tablet Commonly known as: ALDACTONE  Take 1 tablet (25 mg total) by mouth daily.   spironolactone  25 MG tablet Commonly known as: ALDACTONE  Take 1 tablet (25 mg total) by mouth daily.   traMADol  50 MG tablet Commonly known as: ULTRAM  Take 1 tablet (50 mg total) by mouth every 12 (twelve) hours as needed.   VITAMIN C PO Take by mouth daily.   VITAMIN D3/CALCIUM /PHOSPHORUS PO Take 600 mg by mouth daily.   Wegovy  0.25 MG/0.5ML Soaj Generic drug: Semaglutide -Weight Management Inject 0.25 mg into the skin once a week.   ZINC OXIDE PO Take 1  tablet by mouth 2 (two) times a day.          REVIEW OF SYSTEMS: A comprehensive ROS was conducted with the patient and is negative except as per HPI  OBJECTIVE:  VS: BP 122/80 (BP Location: Right Arm, Patient Position: Sitting, Cuff Size: Large)   Pulse 82   Ht 5' 5 (1.651 m)   Wt 266 lb (120.7 kg)   LMP 11/28/2016   SpO2 98%   BMI 44.26 kg/m    Wt Readings from Last 3 Encounters:  07/16/23 266 lb (120.7 kg)  07/13/23 261 lb 2 oz (118.4 kg)  07/06/23 261 lb (118.4 kg)     EXAM: General: Pt appears well and is in NAD  Neck:  Thyroid : Thyroid  gland is prominent  Lungs: Clear with good BS bilat   Heart: Auscultation: RRR.  Abdomen:  soft, nontender  Extremities:  BL LE: No pretibial edema   Mental Status: Judgment, insight: Intact Orientation: Oriented to time, place, and person Mood and affect: No depression, anxiety, or agitation     DATA REVIEWED:   Latest Reference Range & Units 07/16/23 09:21  TSH mIU/L 1.39  Triiodothyronine,Free,Serum 2.3 - 4.2 pg/mL 3.0  T4,Free(Direct) 0.8 - 1.8 ng/dL 1.5     Latest Reference Range & Units 01/05/23 08:46  Sodium 135 - 145 mmol/L 136  Potassium 3.5 - 5.1 mmol/L 4.2  Chloride 98 - 111 mmol/L 104  CO2 22 - 32 mmol/L 28  Glucose 70 - 99 mg/dL 882 (H)  BUN 6 - 20 mg/dL 13  Creatinine 9.55 - 8.99 mg/dL 9.19  Calcium  8.9 - 10.3 mg/dL 9.3  Anion gap 5 - 15  4 (L)  Alkaline Phosphatase 38 - 126 U/L 107  Albumin  3.5 - 5.0 g/dL 4.0  AST 15 - 41 U/L 16  ALT 0 - 44 U/L 16  Total Protein 6.5 - 8.1 g/dL 6.9  Total Bilirubin 0.3 - 1.2 mg/dL 0.3  GFR, Est Non African American >60 mL/min >60    Latest Reference Range & Units 01/05/23 08:46  WBC 4.0 - 10.5 K/uL 4.8  RBC 3.87 - 5.11 MIL/uL 4.40  Hemoglobin 12.0 - 15.0 g/dL 87.5  HCT 63.9 - 53.9 % 37.9  MCV 80.0 - 100.0 fL 86.1  MCH 26.0 - 34.0 pg 28.2  MCHC 30.0 - 36.0 g/dL 67.2  RDW 88.4 - 84.4 % 14.5  Platelets 150 - 400 K/uL 205  nRBC 0.0 - 0.2 % 0.0   Neutrophils % 52  Lymphocytes % 34  Monocytes Relative % 11  Eosinophil % 2  Basophil % 1  Immature Granulocytes % 0  NEUT# 1.7 - 7.7 K/uL 2.5  Lymphocyte # 0.7 - 4.0 K/uL 1.6  Monocyte # 0.1 - 1.0 K/uL 0.5  Eosinophils Absolute 0.0 - 0.5 K/uL 0.1  Basophils Absolute 0.0 - 0.1 K/uL 0.0  Abs Immature Granulocytes 0.00 - 0.07 K/uL 0.01      TRAB <=2.00 IU/L 9.20 High    ASSESSMENT/PLAN/RECOMMENDATIONS:   Hyperthyroidism:   -Patient is clinically euthyroid -TFTs remain normal, no change    Medication Can continue methimazole  5 mg, 1 tablet daily  2.  Graves' disease:  -No extrathyroidal manifestation of Graves' disease   3.  Multinodular goiter:  -No local neck symptoms -She will be due for repeat thyroid  ultrasound 01/2024   Follow-up in 6 months   Signed electronically by: Stefano Redgie Butts, MD  [facility]  [facility] [address] [place] [place], [place] [address] Phone: [phone] FAX: [phone]   CC: [name], MD [address] [place] [address] Phone: [phone] Fax: [phone]   Return to Endocrinology clinic as below: Future Appointments  Date Time Provider Department Center  07/20/2023  8:45 AM Gladis Nest R, PT [facility] None  07/22/2023  8:45 AM Gladis Nest R, PT [facility] None  07/24/2023  9:30 AM Georgina Limes, OT [facility] None  07/27/2023  8:30 AM Jonel Rockie BIRCH, NP [facility] None  08/24/2023  8:30 AM Jonel Rockie D, NP [facility] None  12/14/2023  8:45 AM [facility] None  12/14/2023  9:15 AM Loretha Ash, MD [facility] None  01/18/2024  9:00 AM Aden Berwyn LABOR, PTA [facility] None

## 2023-07-17 ENCOUNTER — Other Ambulatory Visit (HOSPITAL_COMMUNITY): Payer: Self-pay

## 2023-07-17 ENCOUNTER — Encounter: Payer: Self-pay | Admitting: Internal Medicine

## 2023-07-17 ENCOUNTER — Telehealth: Payer: Self-pay

## 2023-07-17 DIAGNOSIS — M1712 Unilateral primary osteoarthritis, left knee: Secondary | ICD-10-CM

## 2023-07-17 MED ORDER — METHIMAZOLE 5 MG PO TABS
5.0000 mg | ORAL_TABLET | Freq: Every day | ORAL | 3 refills | Status: DC
  Filled 2023-07-17 – 2023-12-25 (×4): qty 90, 90d supply, fill #0

## 2023-07-17 NOTE — Telephone Encounter (Signed)
 Called and left a VM advising patient to CB to schedule for gel injection with Dr. Christiane Cowing or Loris Ros.  See referrals tab

## 2023-07-20 ENCOUNTER — Ambulatory Visit (INDEPENDENT_AMBULATORY_CARE_PROVIDER_SITE_OTHER): Payer: 59 | Admitting: Rehabilitative and Restorative Service Providers"

## 2023-07-20 DIAGNOSIS — M25571 Pain in right ankle and joints of right foot: Secondary | ICD-10-CM

## 2023-07-20 DIAGNOSIS — M25661 Stiffness of right knee, not elsewhere classified: Secondary | ICD-10-CM | POA: Diagnosis not present

## 2023-07-20 DIAGNOSIS — M6281 Muscle weakness (generalized): Secondary | ICD-10-CM | POA: Diagnosis not present

## 2023-07-20 DIAGNOSIS — M25671 Stiffness of right ankle, not elsewhere classified: Secondary | ICD-10-CM | POA: Diagnosis not present

## 2023-07-20 DIAGNOSIS — G8929 Other chronic pain: Secondary | ICD-10-CM | POA: Diagnosis not present

## 2023-07-20 DIAGNOSIS — M25561 Pain in right knee: Secondary | ICD-10-CM | POA: Diagnosis not present

## 2023-07-20 DIAGNOSIS — R2689 Other abnormalities of gait and mobility: Secondary | ICD-10-CM

## 2023-07-20 NOTE — Therapy (Signed)
 OUTPATIENT PHYSICAL THERAPY  TREATMENT   Patient Name: Mackenzie [name] MRN: [id number] DOB:[birth year], 53 y.o., female Today's Date: 07/20/2023  END OF SESSION:  PT End of Session - 07/20/23 0854     Visit Number 6    Number of Visits 7    Date for PT Re-Evaluation 07/23/23    Authorization Type [facility] AET[employer]    Progress Note Due on Visit 20    PT Start Time 0845    PT Stop Time 0925    PT Time Calculation (min) 40 min    Activity Tolerance Patient tolerated treatment well    Behavior During Therapy [facility] for tasks assessed/performed                  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, left (HCC) 01/23/2017   Cellulitis of right leg    Edema, lower extremity    GERD (gastroesophageal reflux disease)    Healthcare maintenance 03/25/2017   Herpesviral infection    Hot flashes 03/2017   Hyperlipidemia 03/20/2016   Impaired fasting glucose    Morbid obesity with body mass index (BMI) of 40.0 to 49.9 (HCC) 09/17/2017   Neoplasm of cervix    Osteoarthritis of knee    Other chest pain    Other specified disorders of veins    Polyneuropathy    Postmenopausal atrophic vaginitis    Pre-diabetes 07/26/2018   Hgb A1C elevated on 07/26/2018, Gestational Diabetes 2012   Pure hypercholesterolemia 03/30/2023   Seasonal allergies 2012   seasonal allergies causes allergic rhinitis and itchy, dry eyes per pt   Stress incontinence    Tachycardia    Thyrotoxicosis    Vitamin D  deficiency 05/2015   Past Surgical History:  Procedure Laterality Date   BREAST RECONSTRUCTION WITH PLACEMENT OF TISSUE EXPANDER AND FLEX HD (ACELLULAR HYDRATED DERMIS) Left 08/27/2017   Procedure: LEFT BREAST  RECONSTRUCTION WITH PLACEMENT OF TISSUE EXPANDER AND FLEX HD;  Surgeon: Thornell Flirt, DO;  Location: [facility];  Service: Plastics;  Laterality: Left;   CESAREAN SECTION     x2   KNEE ARTHROSCOPY WITH MEDIAL MENISECTOMY Right 11/12/2022   Procedure: RIGHT KNEE ARTHROSCOPY, PARTIAL MEDIAL MENISCECTOMY;  Surgeon: Wes Hamman, MD;  Location: [facility];  Service: Orthopedics;  Laterality: Right;   LATISSIMUS FLAP TO BREAST Left 11/03/2018   LATISSIMUS FLAP TO BREAST Left 11/03/2018   Procedure: LATISSIMUS FLAP TO LEFT BREAST;  Surgeon: Thornell Flirt, DO;  Location: [facility];  Service: Plastics;  Laterality: Left;   MASTECTOMY MODIFIED RADICAL Left 08/27/2017   MASTECTOMY MODIFIED RADICAL Left 08/27/2017   Procedure: LEFT MODIFIED RADICAL MASTECTOMY;  Surgeon: Sim Dryer, MD;  Location: [facility];  Service: General;  Laterality: Left;   MASTOPEXY Right 03/03/2019   Procedure: RIGHT BREAST MASTOPEXY/REDUCTION;  Surgeon: Thornell Flirt, DO;  Location: [facility];  Service: Plastics;  Laterality: Right;  3 hours, please   PORT-A-CATH REMOVAL Right 08/27/2017   Procedure: REMOVAL PORT-A-CATH RIGHT CHEST;  Surgeon: Sim Dryer, MD;  Location: [facility];  Service: General;  Laterality: Right;   PORTACATH PLACEMENT Right 01/28/2017   Procedure: INSERTION PORT-A-CATH WITH ULTRASOUND;  Surgeon:  Sim Dryer, MD;  Location: [facility];  Service: General;  Laterality: Right;   REDUCTION MAMMAPLASTY Right    REMOVAL OF TISSUE EXPANDER AND PLACEMENT OF IMPLANT Left 03/03/2019   Procedure: REMOVAL OF TISSUE EXPANDER AND PLACEMENT OF EXPANDER;  Surgeon: Thornell Flirt, DO;  Location: [facility];  Service: Plastics;  Laterality: Left;   REMOVAL OF TISSUE EXPANDER AND PLACEMENT OF IMPLANT Left 05/25/2019   Procedure: LEFT BREAST REMOVAL OF TISSUE EXPANDER AND PLACEMENT OF IMPLANT;  Surgeon: Thornell Flirt, DO;  Location:   SURGERY CENTER;  Service: Plastics;  Laterality: Left;   TISSUE EXPANDER PLACEMENT Left 11/03/2018   Procedure: PLACEMENT OF TISSUE EXPANDER LEFT BREAST;  Surgeon: Thornell Flirt, DO;  Location: [facility];  Service: Plastics;  Laterality: Left;  Total case time is 3.5 hours   TUBAL LIGATION Bilateral 01/21/2011   Patient Active Problem List   Diagnosis Date Noted   Graves disease 07/16/2023   Multinodular goiter 07/16/2023   Right carpal tunnel syndrome 07/10/2023   Left carpal tunnel syndrome 07/10/2023   Pain of Right Knee 07/06/2023   History of breast reconstruction 05/15/2023   Pure hypercholesterolemia 03/30/2023   Acute meniscal tear, medial, right, initial encounter 11/12/2022   Plica syndrome of right knee 11/12/2022   Chondromalacia, right knee 11/12/2022   Depression 08/13/2022   BMI 40.0-44.9, adult (HCC) 08/13/2022   Obesity, Beginning BMI 08/13/2022   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   Boil of buttock, Left 12/24/2021   Hyperthyroidism 07/22/2021   Low serum thyroid  stimulating hormone (TSH) 06/12/2021   Other fatigue 05/23/2021   Hemoglobin low 05/23/2021   Polyphagia 05/16/2020   Vitamin D  deficiency 05/16/2020   At risk for osteoporosis 05/16/2020   Prediabetes 05/01/2020   Elevated blood pressure reading 05/01/2020   At risk for impaired metabolic function 05/01/2020   Breast asymmetry following reconstructive surgery 09/20/2019   Essential hypertension 08/17/2018   Class 3 severe obesity with serious comorbidity and body mass index (BMI) of 40.0 to 44.9 in adult [facility]) 09/17/2017   Acquired absence of left breast 09/15/2017   Malignant neoplasm of upper-inner quadrant of left breast in female, 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   Healthcare maintenance 03/25/2017   Malignant 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, estrogen receptor positive (HCC) 01/23/2017   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, left  (HCC) 01/23/2017    PCP: Allene Ivan, MD  REFERRING PROVIDER: Wes Hamman, MD  REFERRING DIAG: M17.11 (ICD-10-CM) - Unilateral primary osteoarthritis, right knee   THERAPY DIAG:  Chronic pain of right knee  Pain in right ankle and joints of right foot  Muscle weakness (generalized)  Other abnormalities of gait and mobility  Stiffness of right ankle, not elsewhere classified  Stiffness of right knee, not elsewhere classified  Rationale for Evaluation and Treatment: Rehabilitation  ONSET DATE: 05/26/2023 referral to PT  SUBJECTIVE:   SUBJECTIVE STATEMENT: Pt indicated feeling achilles complaints more than knees.  Pt indicated not wearing the brace as much.   Pt reported overall improvement at 80% despite today's aggravation.  Pt indicated most troublesome areas include movement in morning (movement after inactivity)  PERTINENT HISTORY: Right knee arthroscopy 11/12/22, History of Lt breast cancer, GERD, hyperlipidemia,   PAIN:  NPRS scale: 7/10 Rt achilles, knees 5/10 Pain location: right knee more medial/anterior, and Achilles tendon Pain description: knee at times sharp / throbbing, general dull ache;  Achilles is pulling, sharp  stretch Aggravating factors: walking esp stairs, getting up when stiff,  Relieving factors: meds, elevate, rest  PRECAUTIONS: NO BP LUE  WEIGHT BEARING RESTRICTIONS: No  FALLS:  Has patient fallen in last 6 months? No  LIVING ENVIRONMENT: Lives with: lives with their daughter 12yo Lives in: House Stairs: Yes: External: 3 steps; none;  sunken den single step no rail.   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   Next MD visit:  not scheduled  OBJECTIVE:  DIAGNOSTIC FINDINGS: 04/21/2023 right ankle X-ray showed Retrocalcaneal osteophyte  formation  PATIENT SURVEYS:  07/20/2023:  update FOTO 59%  Eval: FOTO intake:  52%  predicted:  64%  COGNITION: Eval:  Overall cognitive status: WFL    SENSATION: Eval: WFL  PALPATION: Eval Right knee tenderness to palpation medial joint line and quadriceps tendon.  Denies tenderness to Quad muscle, Patella Tendon or other (not medial) areas of joint line.  Right ankle tenderness to palpation Achilles Tendon. Denies tenderness to Gastroc belly, Plantar Fascia or with Great Toe extension.   LOWER EXTREMITY ROM:   ROM Right eval Right 06/29/23 Rt 07/07/23 supine   Hip flexion      Hip extension      Hip abduction      Hip adduction      Hip internal rotation      Hip external rotation      Knee flexion Supine A: 105* P: 112* with pain medial knee / distal quad Supine P: 113* A: 120 P; 122   Knee extension Supine A: quad set -5* with pain in proximal gastroc Supine P: 0* A: 0   Ankle dorsiflexion Supine with Knee extended  A: +9* P: +10* Knee pain/none Achielles Supine with knee flexed A: 16* P: 20* Standing DF with knee ext P: +19* AA: 10   Ankle plantarflexion Supine A: 21* P: 27* Knee pain/none Achielles     Ankle inversion      Ankle eversion       (Blank rows = not tested)  LOWER EXTREMITY MMT:  MMT Right eval Left eval    Hip flexion      Hip extension      Hip abduction      Hip adduction      Hip internal rotation      Hip external rotation      Knee flexion 4/5 4+    Knee extension 4/5 4+    Ankle dorsiflexion      Ankle plantarflexion 3-/5 3+ (tested standing with calf raise)    Ankle inversion      Ankle eversion       (Blank rows = not tested)  FUNCTIONAL TESTS:  07/20/2023:  07/07/23:  Rt SLS: 1st attempt 13.2 seconds, 2nd attempt: 23.7, 3rd attempt: 33.9 seconds Left SLS: 1st attempt 30.1 seconds 5 times sit to stand: 11.6 sec  Eval: 18 inch chair transfer: able to arise without UE assist but weight shifts to left 5 times sit  to stand: 16.53 sec Lt SLS: 1st attempt 3.96 sec 2nd attempt 11.84 sec Rt SLS: 1st attempt 7.00 sec 2nd attempt 12.89 sec  GAIT: Eval:  Distance walked: 100'  Assistive device utilized: Single point cane Level of assistance: SBA Comments: antalgic with decreased stance duration RLE, right knee flexed in stance, early  heel rise in terminal stance with little to no push-off noted.                      TODAY'S TREATMENT                                                                          DATE: 07/20/2023:  Therex: Recumbent bike lvl 4 9 mins  Calf raise on leg press machine double leg up, Rt leg lowering slowly 2 x 10 37 lbs for eccentric loading Incline gastroc stretch 30 sec x 3  Additional time in review of existing HEP verbally with cues for continued use of stretching for tightness and strengthening for long term gains.   TherActivity (improve stairs, ambulation, tranfers and other functional movement) Leg press double leg 100 lbs x 15, single leg 50 lbs x15 performed bilaterally  Neuro Re-ed Tandem stance 1 min x 2 bilateral on foam with occasional HHA on bar.  Tandem ambulation on foam fwd/back 6 ft in // bars with occasional HHA on bar x 5 each way    TODAY'S TREATMENT                                                                          DATE: 07/14/2023:  Therex: Recumbent bike: level 4 x 9 minutes Heel drop off stretch 2x30sec; pt had increased pain with activity instructed on only going as far into stretch as tolerated. Seated gastroc + plantar fascia stretch with towel 3x30sec  Therapeutic Activities: 2 flights of stair negotiation with step to pattern with affected and non affected leg, step through pattern, and use of bilateral and single railings. Required demonstration and verbal cues for proper form. Patient educated on protection of leg vs using it in order to strengthen the leg for tolerance   Backwards walking x2 bouts: initial steps were unsteady however  once comfortable patient demonstrated increased body awareness and improved posture/mechanics    TODAY'S TREATMENT                                                                          DATE: 07/07/2023:  Recumbent bike: level 4 x 8 minutes IT band stretch: x 2 holding 30 sec Hamstring stretch x 2 holding 30 sec Calf raises: bil up, lowering with Rt only 2 x 10  Leg Press: 100# bil 2 x 10 single leg 50# 2 x 10   NMR:  SLS see scores above Sit to stand: 2 x 5 see scores above Tandem stance: 30 seconds Up and down 1 flight stairs Rt hand rail (pain 5/10 in Rt knee and ankle)  PATIENT EDUCATION:  Access Code: [email or web address] URL:  [email or web address] Date: 06/29/2023 Prepared by: Lorie Rook  Exercises - Seated Hamstring Stretch  - 1 x daily - 7 x weekly - 2-3 reps - 30 seconds hold - Gastroc Stretch on Step  - 1 x daily - 7 x weekly - 1 sets - 2-3 reps - 30 seconds hold - standing calf stretch with forefoot on small step or brick  - 1 x daily - 7 x weekly - 1 sets - 2-3 reps - 30 seconds hold - Standing Soleus Stretch on Foam 1/2 Roll  - 1 x daily - 7 x weekly - 1 sets - 2-3 reps - 30 seconds hold - Modified Thomas Stretch  - 1 x daily - 7 x weekly - 1 sets - 2-3 reps - 30 seconds hold - Supine Quadriceps Stretch with Strap on Table  - 1 x daily - 7 x weekly - 1 sets - 2-3 reps - 30 seconds hold - Hooklying Hamstring Stretch with Strap  - 1 x daily - 7 x weekly - 1 sets - 2-3 reps - 30 seconds hold - Supine ITB Stretch with Strap  - 1 x daily - 7 x weekly - 1 sets - 2-3 reps - 30 seconds hold - Hip Adductors and Hamstring Stretch with Strap  - 1 x daily - 7 x weekly - 1 sets - 2-3 reps - 30 seconds hold - Supine Bridge with Mini Swiss Ball Between Knees  - 1 x daily - 7 x weekly - 2 sets - 10 reps - 5 seconds hold - Clamshell with Resistance  - 1 x daily - 7 x weekly - 4 sets - 10 reps - Supine Active Straight Leg Raise  - 1 x daily - 7 x weekly - 4 sets - 10 reps - 2-3  seconds hold - Seated Straight Leg Raise  - 1 x daily - 7 x weekly - 3 sets - 10 reps - Squat with Chair Touch  - 1 x daily - 7 x weekly - 10 reps - standing alternating legs hip abduction on foam pad  - 1 x daily - 5 x weekly - 1 sets - 10 reps - 5 seconds hold - standing alternating legs hip extension on foam pad  - 1 x daily - 5 x weekly - 1 sets - 10 reps - 5 seconds hold - standing alternating legs hip flexion on foam pad  - 1 x daily - 5 x weekly - 1 sets - 10 reps - 5 seconds hold - Carioca with Counter Support  - 1 x daily - 7 x weekly - 1 sets - 15 reps  ASSESSMENT: CLINICAL IMPRESSION: Patient continues to have achilles pain with only relief found when doing stretching seated. Patient educated on stretching to tolerance not to specific degree of DF. Patient was demonstrated multiple star negotiation strategies safely and was encouraged and educated about increasing amount of attempts at stair negotiation throughout the day/at work. Patient continues to progress and will benefit from continued skilled physical therapy to address pain and deficits.     OBJECTIVE IMPAIRMENTS: Abnormal gait, decreased activity tolerance, decreased balance, decreased endurance, decreased knowledge of condition, decreased knowledge of use of DME, decreased mobility, difficulty walking, decreased ROM, decreased strength, impaired flexibility, obesity, and pain.   ACTIVITY LIMITATIONS: carrying, lifting, bending, sitting, standing, squatting, sleeping, stairs, transfers, and locomotion level  PARTICIPATION LIMITATIONS: meal prep, cleaning, community activity, and occupation  PERSONAL FACTORS: Fitness, Past/current experiences, Time since onset of injury/illness/exacerbation, and 3+  comorbidities: see PMH  are also affecting patient's functional outcome.   REHAB POTENTIAL: Good  CLINICAL DECISION MAKING: Stable/uncomplicated  EVALUATION COMPLEXITY: Moderate   GOALS: Goals reviewed with patient?  Yes  SHORT TERM GOALS: (target date for Short term goals 06/29/2023)   1.  Patient will demonstrate independent use of home exercise program to maintain progress from in clinic treatments. Baseline: See objective data Goal status: MET 06/29/2023  2. PROM right knee 0* - 110* Baseline: See objective data Goal status: MET 06/29/2023  3. PROM right ankle Dorsiflexion +15* with knee extended Baseline: See objective data Goal status: MET 06/29/2023  LONG TERM GOALS: (target dates for all long term goals  08/06/2023 )   1. Patient will demonstrate/report pain at worst less than or equal to 2/10 to facilitate minimal limitation in daily activity secondary to pain symptoms. Baseline: See objective data Goal status: Ongoing 07/07/2023   2. Patient will demonstrate independent use of home exercise program to facilitate ability to maintain/progress functional gains from skilled physical therapy services. Baseline: See objective data Goal status: Ongoing 07/07/2023   3. Patient will demonstrate FOTO outcome > or = 64 % to indicate reduced disability due to condition. Baseline: See objective data Goal status: Ongoing 06/23/2023   4.  Patient will demonstrate right knee and ankle LE MMT 5/5 throughout to faciltiate usual transfers, stairs, squatting at [facility] for daily life.  Baseline: See objective data Goal status: Ongoing 07/07/2023   5.  Patient able to negotiate stairs with single rail alternating pattern modified independent with knee pain </= 2/10. Baseline: See objective data Goal status:   Partially met  06/23/2023    PLAN:  PT FREQUENCY:  1x/week  PT DURATION: 6 weeks  PLANNED INTERVENTIONS: 97164- PT Re-evaluation, 97110-Therapeutic exercises, 97530- Therapeutic activity, 97112- Neuromuscular re-education, 97535- Self Care, 16109- Manual therapy, [id number]- Gait training, Patient/Family education, Balance training, Stair training, Taping, Dry Needling, Joint mobilization, DME instructions,  Cryotherapy, and Moist heat  PLAN FOR NEXT SESSION:  Recert next visit with ROM/ MMT and functional testing repeated/LTG assessment.    Bonna Bustard, PT, DPT, OCS, ATC 07/20/23  9:25 AM

## 2023-07-21 NOTE — Therapy (Signed)
OUTPATIENT OCCUPATIONAL THERAPY ORTHO EVALUATION  Patient Name: Mackenzie [name] MRN: [id number] DOB:[birth year], 53 y.o., female Today's Date: 07/24/2023  PCP: Nadyne Coombes MD REFERRING PROVIDER: Cristie Hem, PA-C   END OF SESSION:  OT End of Session - 07/24/23 0932     Visit Number 1    Number of Visits 4    Date for OT Re-Evaluation 08/21/23    Authorization Type [place] [employer]    OT Start Time [phone]    OT Stop Time 1006    OT Time Calculation (min) 33 min    Activity Tolerance Patient tolerated treatment well;No increased pain    Behavior During Therapy [facility] for tasks assessed/performed             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, left (HCC) 01/23/2017   Cellulitis of right leg    Edema, lower extremity    GERD (gastroesophageal reflux disease)    Healthcare maintenance 03/25/2017   Herpesviral infection    Hot flashes 03/2017   Hyperlipidemia 03/20/2016   Impaired fasting glucose    Morbid obesity with body mass index (BMI) of 40.0 to 49.9 (HCC) 09/17/2017   Neoplasm of cervix    Osteoarthritis of knee    Other chest pain    Other specified disorders of veins    Polyneuropathy    Postmenopausal atrophic vaginitis    Pre-diabetes 07/26/2018   Hgb A1C elevated on 07/26/2018, Gestational Diabetes 2012   Pure hypercholesterolemia 03/30/2023   Seasonal allergies 2012   seasonal allergies causes allergic rhinitis and itchy, dry eyes per pt   Stress incontinence    Tachycardia    Thyrotoxicosis    Vitamin D deficiency 05/2015   Past Surgical History:  Procedure Laterality Date   BREAST RECONSTRUCTION WITH PLACEMENT OF TISSUE EXPANDER AND FLEX HD (ACELLULAR HYDRATED  DERMIS) Left 08/27/2017   Procedure: LEFT BREAST RECONSTRUCTION WITH PLACEMENT OF TISSUE EXPANDER AND FLEX HD;  Surgeon: Peggye Form, DO;  Location: [facility];  Service: Plastics;  Laterality: Left;   CESAREAN SECTION     x2   KNEE ARTHROSCOPY WITH MEDIAL MENISECTOMY Right 11/12/2022   Procedure: RIGHT KNEE ARTHROSCOPY, PARTIAL MEDIAL MENISCECTOMY;  Surgeon: Tarry Kos, MD;  Location: [facility];  Service: Orthopedics;  Laterality: Right;   LATISSIMUS FLAP TO BREAST Left 11/03/2018   LATISSIMUS FLAP TO BREAST Left 11/03/2018   Procedure: LATISSIMUS FLAP TO LEFT BREAST;  Surgeon: Peggye Form, DO;  Location: [facility] OR;  Service: Plastics;  Laterality: Left;   MASTECTOMY MODIFIED RADICAL Left 08/27/2017   MASTECTOMY MODIFIED RADICAL Left 08/27/2017   Procedure: LEFT MODIFIED RADICAL MASTECTOMY;  Surgeon: Harriette Bouillon, MD;  Location: [facility];  Service: General;  Laterality: Left;   MASTOPEXY Right 03/03/2019   Procedure: RIGHT BREAST MASTOPEXY/REDUCTION;  Surgeon: Peggye Form, DO;  Location: [facility];  Service: Plastics;  Laterality: Right;  3 hours, please   PORT-A-CATH REMOVAL Right 08/27/2017   Procedure: REMOVAL PORT-A-CATH RIGHT CHEST;  Surgeon: Harriette Bouillon, MD;  Location: [facility];  Service: General;  Laterality: Right;   PORTACATH PLACEMENT Right 01/28/2017   Procedure: INSERTION PORT-A-CATH WITH ULTRASOUND;  Surgeon:  Harriette Bouillon, MD;  Location: [facility];  Service: General;  Laterality: Right;   REDUCTION MAMMAPLASTY Right    REMOVAL OF TISSUE EXPANDER AND PLACEMENT OF IMPLANT Left 03/03/2019   Procedure: REMOVAL OF TISSUE EXPANDER AND PLACEMENT OF EXPANDER;  Surgeon: Peggye Form, DO;  Location: [facility];  Service: Plastics;  Laterality: Left;   REMOVAL OF TISSUE EXPANDER AND PLACEMENT OF IMPLANT Left 05/25/2019   Procedure: LEFT BREAST REMOVAL OF TISSUE EXPANDER AND PLACEMENT OF IMPLANT;  Surgeon:  Peggye Form, DO;  Location: [facility];  Service: Plastics;  Laterality: Left;   TISSUE EXPANDER PLACEMENT Left 11/03/2018   Procedure: PLACEMENT OF TISSUE EXPANDER LEFT BREAST;  Surgeon: Peggye Form, DO;  Location: [facility];  Service: Plastics;  Laterality: Left;  Total case time is 3.5 hours   TUBAL LIGATION Bilateral 01/21/2011   Patient Active Problem List   Diagnosis Date Noted   Graves disease 07/16/2023   Multinodular goiter 07/16/2023   Right carpal tunnel syndrome 07/10/2023   Left carpal tunnel syndrome 07/10/2023   Pain of Right Knee 07/06/2023   History of breast reconstruction 05/15/2023   Pure hypercholesterolemia 03/30/2023   Acute meniscal tear, medial, right, initial encounter 11/12/2022   Plica syndrome of right knee 11/12/2022   Chondromalacia, right knee 11/12/2022   Depression 08/13/2022   BMI 40.0-44.9, adult (HCC) 08/13/2022   Obesity, Beginning BMI 08/13/2022   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   Boil of buttock, Left 12/24/2021   Hyperthyroidism 07/22/2021   Low serum thyroid stimulating hormone (TSH) 06/12/2021   Other fatigue 05/23/2021   Hemoglobin low 05/23/2021   Polyphagia 05/16/2020   Vitamin D deficiency 05/16/2020   At risk for osteoporosis 05/16/2020   Prediabetes 05/01/2020   Elevated blood pressure reading 05/01/2020   At risk for impaired metabolic function 05/01/2020   Breast asymmetry following reconstructive surgery 09/20/2019   Essential hypertension 08/17/2018   Class 3 severe obesity with serious comorbidity and body mass index (BMI) of 40.0 to 44.9 in adult [facility]) 09/17/2017   Acquired absence of left breast 09/15/2017   Malignant neoplasm of upper-inner quadrant of left breast in female, 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   Healthcare maintenance 03/25/2017   Malignant 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, estrogen receptor positive (HCC) 01/23/2017   Carcinoma of  breast metastatic to axillary lymph node, left (HCC) 01/23/2017    ONSET DATE: chronic  REFERRING DIAG: M54.12 (ICD-10-CM) - Radiculopathy, cervical region   THERAPY DIAG:  Paresthesia of skin  Other lack of coordination  Rationale for Evaluation and Treatment: Rehabilitation  SUBJECTIVE:   SUBJECTIVE STATEMENT: She states her Lt arm has numbness since starting chemo in 2018.   She is a Engineer, civil (consulting) on PCU.  She states numbness is worst in night/early morning. She is currently in PT for for Rt knee and Achilles pain.    PERTINENT HISTORY: OT eval and treat; LUE radiculopathy  PRECAUTIONS: None  RED FLAGS: None   WEIGHT BEARING RESTRICTIONS: No  PAIN:   Are you having pain? No  Just rates numbness and some FMS difficulty  FALLS: Has patient fallen in last 6 months? No  PLOF: Independent  PATIENT GOALS: To improve paresthesia in left hand and arm  NEXT MD VISIT: As needed    OBJECTIVE: (All objective assessments below are from initial evaluation on: 07/24/23 unless otherwise specified.)   HAND DOMINANCE: Right   ADLs: Overall ADLs: States decreased ability to  grab, hold household objects, pain and difficulty to open containers, perform FMS tasks (manipulate fasteners on clothing)   FUNCTIONAL OUTCOME MEASURES: Eval: Quick DASH 20% impairment today  (Higher % Score  =  More Impairment)     UPPER EXTREMITY ROM     Shoulder to Wrist AROM Right eval Left eval  Forearm supination 72 70  Forearm pronation  90 90  Wrist flexion 78 75  Wrist extension 77 73  (Blank rows = not tested)   Hand AROM Left eval  Full Fist Ability (or Gap to Distal Palmar Crease) full  Thumb Opposition  (Kapandji Scale)  10/10  (Blank rows = not tested)   UPPER EXTREMITY MMT:      MMT Right 07/24/23 Left 07/24/23  FPL flexion 5/5 4/5  (Blank rows = not tested)  HAND FUNCTION: Eval: Not overly weak in Lt hand  Grip strength Right: 52 lbs, Left: 56 lbs  Tip pinch Rt: 11#; Lt:  9#  COORDINATION: Eval: She states occasionally dropping objects with her left hand and some fine motor skill coordination issues.  Details will be tested specifically next session as able 9 Hole Peg Test Right: TBDsec, Left: TBD sec (TBD sec is WFL)   SENSATION: Eval:  Light touch intact today, though diminished in left hand median nerve distribution distally compared to right hand  EDEMA:   Eval: None significant   COGNITION: Eval: Overall cognitive status: WFL for evaluation today   OBSERVATIONS:   Eval: Due to reports of increased night paresthesia, weakness with FPL, no significant muscle atrophy but very very mild and left thenar eminence compared to the right-she presents like a chronic median nerve compression largely at the carpal tunnel and possibly some to the biceps and pronator areas.  She does have some palpable muscle spasms in these areas.  She should respond nicely to activity modifications and HEP   TODAY'S TREATMENT:  Post-evaluation treatment:   She was given neuromuscular reeducation education on nerve entrapments, nervous anatomy, preventing nerve entrapments and also activity modifications to prevent these issues.  This is the most important part of nerve rehab.  She has night braces but has not been wearing them consistently so she is told to wear them loosely every night for at least a month.  Next, she was also given the following home exercise program to perform 2-3 times a day every day to help with nerve entrapment and also a bit of tightness that was present in her wrist and forearm, as well as shoulder and bicep.  Each 1 was explained, demonstrated and she states understanding leaves with no pain.   Exercises - Median Nerve Flossing  - 2-3 x daily - 5-10 reps - Fridge Door Stretch  - 4 x daily - 3-5 reps - 15 sec hold - Forearm Supination Stretch  - 3-4 x daily - 3-5 reps - 15 sec hold - Wrist Prayer Stretch  - 4 x daily - 3-5 reps - 15 sec  hold  PATIENT EDUCATION: Education details: See tx section above for details  Person educated: Patient Education method: Verbal Instruction, Teach back, Handouts  Education comprehension: States and demonstrates understanding, Additional Education required    HOME EXERCISE PROGRAM: Access Code: [email or web address] URL: [email or web address] Date: 07/24/2023 Prepared by: Fannie Knee   GOALS: Goals reviewed with patient? Yes   SHORT TERM GOALS: (STG required if POC>30 days) Target Date: 07/31/2023  Pt will obtain protective, custom orthotic. Goal status: TBD/PRN  2.  Pt will demo/state understanding  of initial HEP to improve pain levels and prerequisite motion. Goal status: INITIAL   LONG TERM GOALS: Target Date: 08/21/23  Pt will improve functional ability by decreased impairment per Quick DASH assessment from 20% to 10% or better, for better quality of life. Goal status: INITIAL  2.  Pt will improve A/ROM in left wrist extension from 73 degrees to at least 75 degrees, to show improvements in wrist extensibility for looseness to the carpal tunnel for better motion for tasks like reach and grasp.  Goal status: INITIAL  3.  Pt will improve strength in left thumb FPL flexion from 4/5 MMT to at least 4+/5 MMT to have increased functional ability to carry out selfcare and higher-level homecare tasks with less difficulty. Goal status: INITIAL  4.  Pt will improve coordination skills in left hand, as seen by within functional limit score on nine-hole peg testing to have increased functional ability to carry out fine motor tasks (fasteners, etc.) and more complex, coordinated IADLs (meal prep, sports, etc.).  Goal status: INITIAL-TBD baseline measures next session  5.  Pt will decrease tingling at worst "only intermittent and mild " or better to have better occupational participation in daily roles. Goal status: INITIAL   ASSESSMENT:  CLINICAL IMPRESSION: Patient is a  53 y.o. female who was seen today for occupational therapy evaluation for paresthesia in the left nondominant hand likely caused by median nerve compression through the bicep, pronator, carpal tunnel.  It is exacerbated by her activities especially at night sleeping postures.  She will benefit from outpatient occupational therapy to decrease symptoms and increase quality of life.   PERFORMANCE DEFICITS: in functional skills including ADLs, coordination, sensation, ROM, fascial restrictions, muscle spasms, body mechanics, decreased knowledge of precautions, and UE functional use, cognitive skills including problem solving and safety awareness, and psychosocial skills including coping strategies, environmental adaptation, and habits.   IMPAIRMENTS: are limiting patient from ADLs, IADLs, rest and sleep, and work.   COMORBIDITIES: may have co-morbidities  that affects occupational performance. Patient will benefit from skilled OT to address above impairments and improve overall function.  MODIFICATION OR ASSISTANCE TO COMPLETE EVALUATION: No modification of tasks or assist necessary to complete an evaluation.  OT OCCUPATIONAL PROFILE AND HISTORY: Problem focused assessment: Including review of records relating to presenting problem.  CLINICAL DECISION MAKING: LOW - limited treatment options, no task modification necessary  REHAB POTENTIAL: Excellent  EVALUATION COMPLEXITY: Low      PLAN:  OT FREQUENCY: 1x/week  OT DURATION: 4 weeks through 08/21/2023 and up to 4 total visits as needed  PLANNED INTERVENTIONS: 97168 OT Re-evaluation, 97535 self care/ADL training, 16109 therapeutic exercise, 97530 therapeutic activity, 97112 neuromuscular re-education, 97140 manual therapy, 97035 ultrasound, 97010 moist heat, 97010 cryotherapy, 97760 Orthotics management and training, 60454 Splinting (initial encounter), [id number] Subsequent splinting/medication, Dry needling, coping strategies training, and  patient/family education  RECOMMENDED OTHER SERVICES: None now  CONSULTED AND AGREED WITH PLAN OF CARE: Patient  PLAN FOR NEXT SESSION:   Review initial recommendations and home exercise program, consider dry needling or other manual therapy modalities with any tightness to help with nerve entrapments etc.   Fannie Knee, OTR/L, CHT 07/24/2023, 11:57 AM

## 2023-07-22 ENCOUNTER — Encounter: Payer: Self-pay | Admitting: Physical Therapy

## 2023-07-22 ENCOUNTER — Ambulatory Visit (INDEPENDENT_AMBULATORY_CARE_PROVIDER_SITE_OTHER): Payer: 59 | Admitting: Physical Therapy

## 2023-07-22 DIAGNOSIS — M25671 Stiffness of right ankle, not elsewhere classified: Secondary | ICD-10-CM | POA: Diagnosis not present

## 2023-07-22 DIAGNOSIS — M25561 Pain in right knee: Secondary | ICD-10-CM

## 2023-07-22 DIAGNOSIS — R2689 Other abnormalities of gait and mobility: Secondary | ICD-10-CM

## 2023-07-22 DIAGNOSIS — M25571 Pain in right ankle and joints of right foot: Secondary | ICD-10-CM | POA: Diagnosis not present

## 2023-07-22 DIAGNOSIS — M25661 Stiffness of right knee, not elsewhere classified: Secondary | ICD-10-CM

## 2023-07-22 DIAGNOSIS — M6281 Muscle weakness (generalized): Secondary | ICD-10-CM

## 2023-07-22 DIAGNOSIS — G8929 Other chronic pain: Secondary | ICD-10-CM | POA: Diagnosis not present

## 2023-07-22 NOTE — Therapy (Addendum)
 OUTPATIENT PHYSICAL THERAPY  TREATMENT Progress Note: RECERTIFICATION   Patient Name: Mackenzie [name] MRN: [id number] DOB:[birth year], 53 y.o., female Today's Date: 07/22/2023  Progress Note Reporting Period 06/08/24 to 07/22/2023   See note below for Objective Data and Assessment of Progress/Goals.      END OF SESSION:  PT End of Session - 07/22/23 0856     Visit Number 7    Number of Visits 7    Date for PT Re-Evaluation 09/04/23    Authorization Type [facility] [employer]    Authorization Time Period applied for 6 more week on 05/20/24    Progress Note Due on Visit 20    PT Start Time 0847    PT Stop Time 0930    PT Time Calculation (min) 43 min    Activity Tolerance Patient tolerated treatment well    Behavior During Therapy [facility] for tasks assessed/performed            Total number of visits updated to 13 and certification end date to 09/04/23.        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, left (HCC) 01/23/2017   Cellulitis of right leg    Edema, lower extremity    GERD (gastroesophageal reflux disease)    Healthcare maintenance 03/25/2017   Herpesviral infection    Hot flashes 03/2017   Hyperlipidemia 03/20/2016   Impaired fasting glucose    Morbid obesity with body mass index (BMI) of 40.0 to 49.9 (HCC) 09/17/2017   Neoplasm of cervix    Osteoarthritis of knee    Other chest pain    Other specified disorders of veins    Polyneuropathy    Postmenopausal atrophic vaginitis    Pre-diabetes 07/26/2018   Hgb A1C elevated on 07/26/2018, Gestational Diabetes 2012   Pure hypercholesterolemia 03/30/2023   Seasonal allergies 2012   seasonal allergies causes allergic rhinitis and itchy,  dry eyes per pt   Stress incontinence    Tachycardia    Thyrotoxicosis    Vitamin D deficiency 05/2015   Past Surgical History:  Procedure Laterality Date   BREAST RECONSTRUCTION WITH PLACEMENT OF TISSUE EXPANDER AND FLEX HD (ACELLULAR HYDRATED DERMIS) Left 08/27/2017   Procedure: LEFT BREAST RECONSTRUCTION WITH PLACEMENT OF TISSUE EXPANDER AND FLEX HD;  Surgeon: Peggye Form, DO;  Location: [facility];  Service: Plastics;  Laterality: Left;   CESAREAN SECTION     x2   KNEE ARTHROSCOPY WITH MEDIAL MENISECTOMY Right 11/12/2022   Procedure: RIGHT KNEE ARTHROSCOPY, PARTIAL MEDIAL MENISCECTOMY;  Surgeon: Tarry Kos, MD;  Location: [facility];  Service: Orthopedics;  Laterality: Right;   LATISSIMUS FLAP TO BREAST Left 11/03/2018   LATISSIMUS FLAP TO BREAST Left 11/03/2018   Procedure: LATISSIMUS FLAP TO LEFT BREAST;  Surgeon: Peggye Form, DO;  Location: [facility];  Service: Plastics;  Laterality: Left;   MASTECTOMY MODIFIED RADICAL Left 08/27/2017   MASTECTOMY MODIFIED RADICAL Left 08/27/2017   Procedure: LEFT MODIFIED RADICAL MASTECTOMY;  Surgeon: Harriette Bouillon, MD;  Location: [facility];  Service: General;  Laterality: Left;   MASTOPEXY Right 03/03/2019   Procedure: RIGHT BREAST MASTOPEXY/REDUCTION;  Surgeon: Peggye Form, DO;  Location: [facility];  Service: Government social research officer;  Laterality: Right;  3 hours, please   PORT-A-CATH REMOVAL Right 08/27/2017   Procedure: REMOVAL PORT-A-CATH RIGHT CHEST;  Surgeon: Harriette Bouillon, MD;  Location: [facility];  Service: General;  Laterality: Right;   PORTACATH PLACEMENT Right 01/28/2017   Procedure: INSERTION PORT-A-CATH WITH ULTRASOUND;  Surgeon: Harriette Bouillon, MD;  Location: [facility];  Service: General;  Laterality: Right;   REDUCTION MAMMAPLASTY Right    REMOVAL OF TISSUE EXPANDER AND PLACEMENT OF IMPLANT Left 03/03/2019   Procedure: REMOVAL OF TISSUE EXPANDER AND PLACEMENT OF EXPANDER;  Surgeon:  Peggye Form, DO;  Location: [facility];  Service: Plastics;  Laterality: Left;   REMOVAL OF TISSUE EXPANDER AND PLACEMENT OF IMPLANT Left 05/25/2019   Procedure: LEFT BREAST REMOVAL OF TISSUE EXPANDER AND PLACEMENT OF IMPLANT;  Surgeon: Peggye Form, DO;  Location: [facility];  Service: Plastics;  Laterality: Left;   TISSUE EXPANDER PLACEMENT Left 11/03/2018   Procedure: PLACEMENT OF TISSUE EXPANDER LEFT BREAST;  Surgeon: Peggye Form, DO;  Location: [facility];  Service: Plastics;  Laterality: Left;  Total case time is 3.5 hours   TUBAL LIGATION Bilateral 01/21/2011   Patient Active Problem List   Diagnosis Date Noted   Graves disease 07/16/2023   Multinodular goiter 07/16/2023   Right carpal tunnel syndrome 07/10/2023   Left carpal tunnel syndrome 07/10/2023   Pain of Right Knee 07/06/2023   History of breast reconstruction 05/15/2023   Pure hypercholesterolemia 03/30/2023   Acute meniscal tear, medial, right, initial encounter 11/12/2022   Plica syndrome of right knee 11/12/2022   Chondromalacia, right knee 11/12/2022   Depression 08/13/2022   BMI 40.0-44.9, adult (HCC) 08/13/2022   Obesity, Beginning BMI 08/13/2022   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   Boil of buttock, Left 12/24/2021   Hyperthyroidism 07/22/2021   Low serum thyroid stimulating hormone (TSH) 06/12/2021   Other fatigue 05/23/2021   Hemoglobin low 05/23/2021   Polyphagia 05/16/2020   Vitamin D deficiency 05/16/2020   At risk for osteoporosis 05/16/2020   Prediabetes 05/01/2020   Elevated blood pressure reading 05/01/2020   At risk for impaired metabolic function 05/01/2020   Breast asymmetry following reconstructive surgery 09/20/2019   Essential hypertension 08/17/2018   Class 3 severe obesity with serious comorbidity and body mass index (BMI) of 40.0 to 44.9 in adult [facility]) 09/17/2017   Acquired absence of left breast 09/15/2017   Malignant  neoplasm of upper-inner quadrant of left breast in female, 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   Healthcare maintenance 03/25/2017   Malignant 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, estrogen receptor positive (HCC) 01/23/2017   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, left (HCC) 01/23/2017    PCP: Dois Davenport, MD  REFERRING PROVIDER: Cristie Hem, PA-C  REFERRING DIAG: M17.11 (ICD-10-CM) - Unilateral primary osteoarthritis, right knee   THERAPY DIAG:  Chronic pain of right knee  Pain in right ankle and joints of right foot  Muscle weakness (generalized)  Other abnormalities of gait and mobility  Stiffness of right ankle, not elsewhere classified  Stiffness of right knee, not elsewhere classified  Rationale for Evaluation and Treatment: Rehabilitation  ONSET DATE: 05/26/2023 referral to PT  SUBJECTIVE:   SUBJECTIVE STATEMENT: Pt arriving reporting 5-6/10 pain with stair navigation. Pt reporting after sitting for prolonged it takes a little time to get going.   PERTINENT HISTORY: Right knee arthroscopy 11/12/22, History of Lt breast cancer, GERD, hyperlipidemia,  PAIN:  NPRS scale: 7/10 Rt achilles, knees 5/10 Pain location: right knee more medial/anterior, and Achilles tendon Pain description: knee at times sharp / throbbing, general dull ache;  Achilles is pulling, sharp stretch Aggravating factors: walking esp stairs, getting up when stiff,  Relieving factors: meds, elevate, rest  PRECAUTIONS: NO BP LUE  WEIGHT BEARING RESTRICTIONS: No  FALLS:  Has patient fallen in last 6 months? No  LIVING ENVIRONMENT: Lives with: lives with their daughter 12yo Lives in: House Stairs: Yes: External: 3 steps; none;  sunken den single step no rail.   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   Next MD visit:  not scheduled  OBJECTIVE:  DIAGNOSTIC FINDINGS: 04/21/2023 right ankle X-ray showed Retrocalcaneal osteophyte formation  PATIENT SURVEYS:  07/22/23: update FOTO 61%  07/20/2023:  update FOTO 59%  Eval: FOTO intake:  52%  predicted:  64%  COGNITION: Eval:  Overall cognitive status: WFL    SENSATION: Eval: WFL  PALPATION: Eval Right knee tenderness to palpation medial joint line and quadriceps tendon.  Denies tenderness to Quad muscle, Patella Tendon or other (not medial) areas of joint line.  Right ankle tenderness to palpation Achilles Tendon. Denies tenderness to Gastroc belly, Plantar Fascia or with Great Toe extension.   LOWER EXTREMITY ROM:   ROM Right eval Right 06/29/23 Rt 07/07/23 supine Rt Supine Active  Hip flexion      Hip extension      Hip abduction      Hip adduction      Hip internal rotation      Hip external rotation      Knee flexion Supine A: 105* P: 112* with pain medial knee / distal quad Supine P: 113* A: 120 P; 122 A: 122  Knee extension Supine A: quad set -5* with pain in proximal gastroc Supine P: 0* A: 0 A:0  Ankle dorsiflexion Supine with Knee extended  A: +9* P: +10* Knee pain/none Achielles Supine with knee flexed A: 16* P: 20* Standing DF with knee ext P: +19* AA: 10 A:10  Ankle plantarflexion Supine A: 21* P: 27* Knee pain/none Achielles     Ankle inversion      Ankle eversion       (Blank rows = not tested)  LOWER EXTREMITY MMT:  MMT Right eval Left eval Rt 07/22/23 Left 07/22/23  Hip flexion      Hip extension      Hip abduction      Hip adduction      Hip internal rotation      Hip external rotation      Knee flexion 4/5 4+ 4+ 5  Knee extension 4/5 4+ 4+ 4+  Ankle dorsiflexion      Ankle plantarflexion 3-/5 3+ (tested standing with calf raise)    Ankle inversion      Ankle eversion       (Blank rows = not tested)  FUNCTIONAL TESTS:   07/20/2023:  07/07/23:  Rt SLS: 1st attempt 13.2 seconds, 2nd attempt: 23.7, 3rd attempt: 33.9 seconds Left SLS: 1st attempt 30.1 seconds 5 times sit to stand: 11.6 sec  Eval: 18 inch chair transfer: able to arise without UE assist but weight shifts to left 5 times sit to stand: 16.53 sec Lt SLS: 1st attempt 3.96 sec 2nd  attempt 11.84 sec Rt SLS: 1st attempt 7.00 sec 2nd attempt 12.89 sec  GAIT: Eval:  Distance walked: 100'  Assistive device utilized: Single point cane Level of assistance: SBA Comments: antalgic with decreased stance duration RLE, right knee flexed in stance, early heel rise in terminal stance with little to no push-off noted.                     TODAY'S TREATMENT                                                                          DATE: 07/22/2023:  Therex: Recumbent bike, seat 6,  lvl 4, x 9 mins  Calf raise on leg press machine double leg up, Rt leg lowering slowly 2 x 10 37# for eccentric loading Incline gastroc stretch 30 sec x 3  All MMT and ROM updated this visit see charts above  TherActivity (improve stairs, ambulation, tranfers and other functional movement) Leg press double leg 106# 2 x 10, single leg 62 lbs x 15  performed bilaterally Step downs on 4 inch step x 15 bil LE's c UE support Step ups on 6 inch step x 15 bil LE's c UE support TRX squats to chair height         TODAY'S TREATMENT                                                                          DATE: 07/20/2023:  Therex: Recumbent bike lvl 4 9 mins  Calf raise on leg press machine double leg up, Rt leg lowering slowly 2 x 10 37 lbs for eccentric loading Incline gastroc stretch 30 sec x 3  Additional time in review of existing HEP verbally with cues for continued use of stretching for tightness and strengthening for long term gains.   TherActivity (improve stairs, ambulation, tranfers and other functional movement) Leg press double leg 100 lbs x 15, single leg 50 lbs x15  performed bilaterally  Neuro Re-ed Tandem stance 1 min x 2 bilateral on foam with occasional HHA on bar.  Tandem ambulation on foam fwd/back 6 ft in // bars with occasional HHA on bar x 5 each way    TODAY'S TREATMENT                                                                          DATE: 07/14/2023:  Therex: Recumbent bike: level 4 x 9 minutes Heel drop off stretch 2x30sec; pt had increased pain with activity instructed on only going as far into stretch as tolerated. Seated gastroc + plantar fascia stretch with towel 3x30sec  Therapeutic Activities: 2 flights of stair negotiation with step  to pattern with affected and non affected leg, step through pattern, and use of bilateral and single railings. Required demonstration and verbal cues for proper form. Patient educated on protection of leg vs using it in order to strengthen the leg for tolerance   Backwards walking x2 bouts: initial steps were unsteady however once comfortable patient demonstrated increased body awareness and improved posture/mechanics    TODAY'S TREATMENT                                                                          DATE: 07/07/2023:  Recumbent bike: level 4 x 8 minutes IT band stretch: x 2 holding 30 sec Hamstring stretch x 2 holding 30 sec Calf raises: bil up, lowering with Rt only 2 x 10  Leg Press: 100# bil 2 x 10 single leg 50# 2 x 10   NMR:  SLS see scores above Sit to stand: 2 x 5 see scores above Tandem stance: 30 seconds Up and down 1 flight stairs Rt hand rail (pain 5/10 in Rt knee and ankle)    PATIENT EDUCATION:  Access Code: [email or web address] URL: [email or web address] Date: 06/29/2023 Prepared by: Vladimir Faster  Exercises - Seated Hamstring Stretch  - 1 x daily - 7 x weekly - 2-3 reps - 30 seconds hold - Gastroc Stretch on Step  - 1 x daily - 7 x weekly - 1 sets - 2-3 reps - 30 seconds hold - standing calf stretch with forefoot on small step or brick  - 1 x daily - 7 x  weekly - 1 sets - 2-3 reps - 30 seconds hold - Standing Soleus Stretch on Foam 1/2 Roll  - 1 x daily - 7 x weekly - 1 sets - 2-3 reps - 30 seconds hold - Modified Thomas Stretch  - 1 x daily - 7 x weekly - 1 sets - 2-3 reps - 30 seconds hold - Supine Quadriceps Stretch with Strap on Table  - 1 x daily - 7 x weekly - 1 sets - 2-3 reps - 30 seconds hold - Hooklying Hamstring Stretch with Strap  - 1 x daily - 7 x weekly - 1 sets - 2-3 reps - 30 seconds hold - Supine ITB Stretch with Strap  - 1 x daily - 7 x weekly - 1 sets - 2-3 reps - 30 seconds hold - Hip Adductors and Hamstring Stretch with Strap  - 1 x daily - 7 x weekly - 1 sets - 2-3 reps - 30 seconds hold - Supine Bridge with Mini Swiss Ball Between Knees  - 1 x daily - 7 x weekly - 2 sets - 10 reps - 5 seconds hold - Clamshell with Resistance  - 1 x daily - 7 x weekly - 4 sets - 10 reps - Supine Active Straight Leg Raise  - 1 x daily - 7 x weekly - 4 sets - 10 reps - 2-3 seconds hold - Seated Straight Leg Raise  - 1 x daily - 7 x weekly - 3 sets - 10 reps - Squat with Chair Touch  - 1 x daily - 7 x weekly - 10 reps - standing alternating legs hip abduction on foam pad  - 1 x daily -  5 x weekly - 1 sets - 10 reps - 5 seconds hold - standing alternating legs hip extension on foam pad  - 1 x daily - 5 x weekly - 1 sets - 10 reps - 5 seconds hold - standing alternating legs hip flexion on foam pad  - 1 x daily - 5 x weekly - 1 sets - 10 reps - 5 seconds hold - Carioca with Counter Support  - 1 x daily - 7 x weekly - 1 sets - 15 reps  ASSESSMENT: CLINICAL IMPRESSION: Pt has met all STG's set at her evaluation and still progressing toward all her LTG's. Pt has improved her ankle ROM and strength and knee strength but still experiencing pain and difficulty with certain functional mobility including stair navigation. I am recommending 6 more weeks of skilled PT interventions to maximize pt's function.   OBJECTIVE IMPAIRMENTS: Abnormal gait,  decreased activity tolerance, decreased balance, decreased endurance, decreased knowledge of condition, decreased knowledge of use of DME, decreased mobility, difficulty walking, decreased ROM, decreased strength, impaired flexibility, obesity, and pain.   ACTIVITY LIMITATIONS: carrying, lifting, bending, sitting, standing, squatting, sleeping, stairs, transfers, and locomotion level  PARTICIPATION LIMITATIONS: meal prep, cleaning, community activity, and occupation  PERSONAL FACTORS: Fitness, Past/current experiences, Time since onset of injury/illness/exacerbation, and 3+ comorbidities: see PMH  are also affecting patient's functional outcome.   REHAB POTENTIAL: Good  CLINICAL DECISION MAKING: Stable/uncomplicated  EVALUATION COMPLEXITY: Moderate   GOALS: Goals reviewed with patient? Yes  SHORT TERM GOALS: (target date for Short term goals 06/29/2023)   1.  Patient will demonstrate independent use of home exercise program to maintain progress from in clinic treatments. Baseline: See objective data Goal status: MET 06/29/2023  2. PROM right knee 0* - 110* Baseline: See objective data Goal status: MET 06/29/2023  3. PROM right ankle Dorsiflexion +15* with knee extended Baseline: See objective data Goal status: MET 06/29/2023  LONG TERM GOALS: (target dates for all long term goals 09/04/23 )   1. Patient will demonstrate/report pain at worst less than or equal to 2/10 to facilitate minimal limitation in daily activity secondary to pain symptoms. Baseline: See objective data Goal status: Ongoing 07/22/23 (5-6/10 in pt's knee and achilles)   2. Patient will demonstrate independent use of home exercise program to facilitate ability to maintain/progress functional gains from skilled physical therapy services. Baseline: See objective data Goal status: Ongoing 05/20/24    3. Patient will demonstrate FOTO outcome > or = 64 % to indicate reduced disability due to condition. Baseline: See  objective data Goal status: On-going 07/22/23: (61%)   4.  Patient will demonstrate right knee and ankle LE MMT 5/5 throughout to faciltiate usual transfers, stairs, squatting at [facility] for daily life.  Baseline: See objective data Goal status: Ongoing 07/22/2023    5.  Patient able to negotiate stairs with single rail alternating pattern modified independent with knee pain </= 2/10. Baseline: See objective data Goal status:   Partially met  07/22/2023    PLAN:  PT FREQUENCY:  1x/week  PT DURATION: 6 weeks  PLANNED INTERVENTIONS: [id number]- PT Re-evaluation, 97110-Therapeutic exercises, 97530- Therapeutic activity, 97112- Neuromuscular re-education, 97535- Self Care, [id number]- Manual therapy, [phone]- Gait training, Patient/Family education, Balance training, Stair training, Taping, Dry Needling, Joint mobilization, DME instructions, Cryotherapy, and Moist heat  PLAN FOR NEXT SESSION:  stair training and strengthening  Re-cert performed on 07/22/23  Narda Amber, PT, MPT 07/22/23 9:33 AM   07/22/23  9:33 AM

## 2023-07-24 ENCOUNTER — Encounter: Payer: Self-pay | Admitting: Rehabilitative and Restorative Service Providers"

## 2023-07-24 ENCOUNTER — Ambulatory Visit: Payer: 59 | Admitting: Rehabilitative and Restorative Service Providers"

## 2023-07-24 DIAGNOSIS — R202 Paresthesia of skin: Secondary | ICD-10-CM

## 2023-07-24 DIAGNOSIS — R278 Other lack of coordination: Secondary | ICD-10-CM | POA: Diagnosis not present

## 2023-07-27 ENCOUNTER — Other Ambulatory Visit (HOSPITAL_COMMUNITY): Payer: Self-pay

## 2023-07-27 ENCOUNTER — Encounter (INDEPENDENT_AMBULATORY_CARE_PROVIDER_SITE_OTHER): Payer: Self-pay | Admitting: Adult Health

## 2023-07-27 ENCOUNTER — Ambulatory Visit (INDEPENDENT_AMBULATORY_CARE_PROVIDER_SITE_OTHER): Payer: 59 | Admitting: Adult Health

## 2023-07-27 VITALS — BP 114/68 | HR 78 | Temp 97.9°F | Ht 65.0 in | Wt 261.0 lb

## 2023-07-27 DIAGNOSIS — I10 Essential (primary) hypertension: Secondary | ICD-10-CM | POA: Diagnosis not present

## 2023-07-27 DIAGNOSIS — F5089 Other specified eating disorder: Secondary | ICD-10-CM

## 2023-07-27 DIAGNOSIS — F3289 Other specified depressive episodes: Secondary | ICD-10-CM

## 2023-07-27 DIAGNOSIS — Z6841 Body Mass Index (BMI) 40.0 and over, adult: Secondary | ICD-10-CM | POA: Diagnosis not present

## 2023-07-27 DIAGNOSIS — E669 Obesity, unspecified: Secondary | ICD-10-CM | POA: Diagnosis not present

## 2023-07-27 DIAGNOSIS — E559 Vitamin D deficiency, unspecified: Secondary | ICD-10-CM | POA: Diagnosis not present

## 2023-07-27 DIAGNOSIS — R7303 Prediabetes: Secondary | ICD-10-CM | POA: Diagnosis not present

## 2023-07-27 MED ORDER — METFORMIN HCL 500 MG PO TABS
500.0000 mg | ORAL_TABLET | Freq: Two times a day (BID) | ORAL | 0 refills | Status: DC
  Filled 2023-07-27: qty 180, 90d supply, fill #0

## 2023-07-27 MED ORDER — BUPROPION HCL ER (SR) 200 MG PO TB12
200.0000 mg | ORAL_TABLET | Freq: Every day | ORAL | 0 refills | Status: DC
Start: 2023-07-27 — End: 2023-10-13
  Filled 2023-07-27: qty 90, 90d supply, fill #0

## 2023-07-27 NOTE — Progress Notes (Signed)
WEIGHT SUMMARY AND BIOMETRICS  Vitals Temp: 97.9 F (36.6 C) BP: 114/68 Pulse Rate: 78 SpO2: 98 %   Anthropometric Measurements Height: 5\' 5"  (1.651 m) Weight: 261 lb (118.4 kg) BMI (Calculated): 43.43 Weight at Last Visit: 261 lb Weight Lost Since Last Visit: 0 Weight Gained Since Last Visit: 0 Starting Weight: 267 lb Total Weight Loss (lbs): 6 lb (2.722 kg)   Body Composition  Body Fat %: 48.9 % Fat Mass (lbs): 127.8 lbs Muscle Mass (lbs): 126.8 lbs Total Body Water (lbs): 87.4 lbs Visceral Fat Rating : 16   Other Clinical Data Fasting: no Labs: no Today's Visit #: 68 Starting Date: 08/09/19    Chief Complaint:   OBESITY Mackenzie [name] is here to discuss Mackenzie [name] progress with Mackenzie [name] obesity treatment plan.  Mackenzie [name] is on the the Category 3 Plan and states Mackenzie [name] is following Mackenzie [name] eating plan approximately 70 % of the time.  Mackenzie [name] states Mackenzie [name] is exercising stationary bike 20-30 minutes 2 times per week.   Interim History:  Mackenzie [name] continues to experience R knee and R ankle/Achilles pain. R Knee: Pain is intermittent, described as soreness, and rated 5/10 R Ankle: Pain is constant, described as pulling/stretching pain, and rated at 9/10  Mackenzie [name] is followed by Cyndia Skeeters for Mackenzie [name] Orthopedic complaints  Exercise-stationary bike and NEAT Activities.  Subjective:   1. Essential hypertension BP at goal at OV Mackenzie [name] denies CP when riding Mackenzie [name] stationary bike. Mackenzie [name] is on spironolactone (ALDACTONE) 25 MG tablet  losartan (COZAAR) 50 MG tablet   2. Pre-diabetes Lab Results  Component Value Date   HGBA1C 6.1 (H) 06/08/2023   HGBA1C 6.0 (H) 01/19/2023   HGBA1C 5.8 (H) 09/03/2022     Latest Reference Range & Units 01/19/23 09:39 06/08/23 10:36  INSULIN 2.6 - 24.9 uIU/mL 15.4 24.1   Mackenzie [name]'s mother was dx'd with diabetes in Mackenzie [name] late 76s Mackenzie [name] is on daily Metformin 500mg  BID with meals. Mackenzie [name] was previously on [facility] therapy, Mackenzie [name] would like to restart- however currently Mackenzie [name] insurance  will not cover GLP-1 therapy for Obesity Tx.  3. Vitamin D deficiency  Latest Reference Range & Units 06/08/23 10:36  Vitamin D, 25-Hydroxy 30.0 - 100.0 ng/mL 65.8   Mackenzie [name] is on OTC Vit D therapy Vit D Level is stable at last check  4. Emotional Eating Behavior Mackenzie [name] is on daily Wellbutrin SR 200mg  BP stable at OV Mackenzie [name] endorses stable mood, denies SI/HI  Assessment/Plan:   1. Essential hypertension (Primary) Limit Na+ Increase plain water intake Increase cardiovascular exercise (as tolerated with Mackenzie [name] current R knee and R achilles pain).  2. Pre-diabetes Refill - metFORMIN (GLUCOPHAGE) 500 MG tablet; Take 1 tablet (500 mg total) by mouth 2 (two) times daily with a meal.  Dispense: 180 tablet; Refill: 0  Provided [place] Benefits contact information- please call to obtain accurate information of [employer]ep[employer] coverage. 3. Vitamin D deficiency Check Vit D Level Q2-31M  4. Emotional Eating Behavior Refill - buPROPion (WELLBUTRIN SR) 200 MG 12 hr tablet; Take 1 tablet (200 mg total) by mouth daily.  Dispense: 90 tablet; Refill: 0  5. Obesity, Current BMI 43.6  Mackenzie [name] is currently in the action stage of change. As such, Mackenzie [name] goal is to continue with weight loss efforts. Mackenzie [name] has agreed to the Category 3 Plan.   Exercise goals: All adults should avoid inactivity. Some physical activity is better than none, and adults who participate in any amount of physical activity gain some health benefits. Adults should  also include muscle-strengthening activities that involve all major muscle groups on 2 or more days a week.  Behavioral modification strategies: increasing lean protein intake, decreasing simple carbohydrates, increasing vegetables, increasing water intake, meal planning and cooking strategies, keeping healthy foods in the home, ways to avoid boredom eating, and planning for success.  Mackenzie [name] has agreed to follow-up with our clinic in 4 weeks. Mackenzie [name] was informed of the  importance of frequent follow-up visits to maximize Mackenzie [name] success with intensive lifestyle modifications for Mackenzie [name] multiple health conditions.   Objective:   Blood pressure 114/68, pulse 78, temperature 97.9 F (36.6 C), height 5\' 5"  (1.651 m), weight 261 lb (118.4 kg), last menstrual period 11/28/2016, SpO2 98%, unknown if currently breastfeeding. Body mass index is 43.43 kg/m.  General: Cooperative, alert, well developed, in no acute distress. HEENT: Conjunctivae and lids unremarkable. Cardiovascular: Regular rhythm.  Lungs: Normal work of breathing. Neurologic: No focal deficits.   Lab Results  Component Value Date   CREATININE 1.10 (H) 06/09/2023   BUN 28 (H) 06/09/2023   NA 132 (L) 06/09/2023   K 4.0 06/09/2023   CL 98 06/09/2023   CO2 28 06/09/2023   Lab Results  Component Value Date   ALT 28 06/09/2023   AST 21 06/09/2023   GGT 49 06/09/2023   ALKPHOS 132 (H) 06/09/2023   BILITOT 0.5 06/09/2023   Lab Results  Component Value Date   HGBA1C 6.1 (H) 06/08/2023   HGBA1C 6.0 (H) 01/19/2023   HGBA1C 5.8 (H) 09/03/2022   HGBA1C 5.4 01/29/2022   HGBA1C 5.4 12/19/2021   Lab Results  Component Value Date   INSULIN 24.1 06/08/2023   INSULIN 15.4 01/19/2023   INSULIN 13.1 09/03/2022   INSULIN 21.3 01/29/2022   INSULIN 18.2 01/07/2022   Lab Results  Component Value Date   TSH 1.39 07/16/2023   Lab Results  Component Value Date   CHOL 197 06/08/2023   HDL 56 06/08/2023   LDLCALC 124 (H) 06/08/2023   TRIG 93 06/08/2023   CHOLHDL 3.0 03/31/2023   Lab Results  Component Value Date   VD25OH 65.8 06/08/2023   VD25OH 76.5 01/19/2023   VD25OH 81.2 09/03/2022   Lab Results  Component Value Date   WBC 7.0 06/09/2023   HGB 13.4 06/09/2023   HCT 39.9 06/09/2023   MCV 83.6 06/09/2023   PLT 247 06/09/2023   No results found for: "IRON", "TIBC", "FERRITIN"  Attestation Statements:   Reviewed by clinician on day of visit: allergies, medications, problem list,  medical history, surgical history, family history, social history, and previous encounter notes.  I have reviewed the above documentation for accuracy and completeness, and I agree with the above. -  Adriel Kessen d. Everard Interrante, NP-C

## 2023-07-30 NOTE — Therapy (Signed)
 OUTPATIENT OCCUPATIONAL THERAPY TREATMENT NOTE   Patient Name: Mackenzie [name] MRN: [id number] DOB:08-30-1970, 53 y.o., female Today's Date: 08/03/2023  PCP: Nadyne Coombes MD REFERRING PROVIDER: Cristie Hem, PA-C   END OF SESSION:  OT End of Session - 08/03/23 0858     Visit Number 2    Number of Visits 4    Date for OT Re-Evaluation 08/21/23    Authorization Type [facility] [employer]    OT Start Time 4098    OT Stop Time 0918    OT Time Calculation (min) 20 min    Activity Tolerance Patient tolerated treatment well;No increased pain    Behavior During Therapy [facility] for tasks assessed/performed              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, left (HCC) 01/23/2017   Cellulitis of right leg    Edema, lower extremity    GERD (gastroesophageal reflux disease)    Healthcare maintenance 03/25/2017   Herpesviral infection    Hot flashes 03/2017   Hyperlipidemia 03/20/2016   Impaired fasting glucose    Morbid obesity with body mass index (BMI) of 40.0 to 49.9 (HCC) 09/17/2017   Neoplasm of cervix    Osteoarthritis of knee    Other chest pain    Other specified disorders of veins    Polyneuropathy    Postmenopausal atrophic vaginitis    Pre-diabetes 07/26/2018   Hgb A1C elevated on 07/26/2018, Gestational Diabetes 2012   Pure hypercholesterolemia 03/30/2023   Seasonal allergies 2012   seasonal allergies causes allergic rhinitis and itchy, dry eyes per pt   Stress incontinence    Tachycardia    Thyrotoxicosis    Vitamin D deficiency 05/2015   Past Surgical History:  Procedure Laterality Date   BREAST RECONSTRUCTION WITH PLACEMENT OF TISSUE EXPANDER AND FLEX HD (ACELLULAR HYDRATED  DERMIS) Left 08/27/2017   Procedure: LEFT BREAST RECONSTRUCTION WITH PLACEMENT OF TISSUE EXPANDER AND FLEX HD;  Surgeon: Peggye Form, DO;  Location: [facility];  Service: Plastics;  Laterality: Left;   CESAREAN SECTION     x2   KNEE ARTHROSCOPY WITH MEDIAL MENISECTOMY Right 11/12/2022   Procedure: RIGHT KNEE ARTHROSCOPY, PARTIAL MEDIAL MENISCECTOMY;  Surgeon: Tarry Kos, MD;  Location: [facility];  Service: Orthopedics;  Laterality: Right;   LATISSIMUS FLAP TO BREAST Left 11/03/2018   LATISSIMUS FLAP TO BREAST Left 11/03/2018   Procedure: LATISSIMUS FLAP TO LEFT BREAST;  Surgeon: Peggye Form, DO;  Location: [facility];  Service: Plastics;  Laterality: Left;   MASTECTOMY MODIFIED RADICAL Left 08/27/2017   MASTECTOMY MODIFIED RADICAL Left 08/27/2017   Procedure: LEFT MODIFIED RADICAL MASTECTOMY;  Surgeon: Harriette Bouillon, MD;  Location: [facility];  Service: General;  Laterality: Left;   MASTOPEXY Right 03/03/2019   Procedure: RIGHT BREAST MASTOPEXY/REDUCTION;  Surgeon: Peggye Form, DO;  Location: [facility];  Service: Plastics;  Laterality: Right;  3 hours, please   PORT-A-CATH REMOVAL Right 08/27/2017   Procedure: REMOVAL PORT-A-CATH RIGHT CHEST;  Surgeon: Harriette Bouillon, MD;  Location: [facility];  Service: General;  Laterality: Right;   PORTACATH PLACEMENT Right 01/28/2017   Procedure: INSERTION PORT-A-CATH WITH ULTRASOUND;  Surgeon: Harriette Bouillon, MD;  Location: [facility];  Service: General;  Laterality: Right;   REDUCTION MAMMAPLASTY Right    REMOVAL OF TISSUE EXPANDER AND PLACEMENT OF IMPLANT Left 03/03/2019   Procedure: REMOVAL OF TISSUE EXPANDER AND PLACEMENT OF EXPANDER;  Surgeon: Peggye Form, DO;  Location: [facility];  Service: Plastics;  Laterality: Left;   REMOVAL OF TISSUE EXPANDER AND PLACEMENT OF IMPLANT Left 05/25/2019   Procedure: LEFT BREAST REMOVAL OF TISSUE EXPANDER AND PLACEMENT OF IMPLANT;  Surgeon:  Peggye Form, DO;  Location: [facility];  Service: Plastics;  Laterality: Left;   TISSUE EXPANDER PLACEMENT Left 11/03/2018   Procedure: PLACEMENT OF TISSUE EXPANDER LEFT BREAST;  Surgeon: Peggye Form, DO;  Location: [facility];  Service: Plastics;  Laterality: Left;  Total case time is 3.5 hours   TUBAL LIGATION Bilateral 01/21/2011   Patient Active Problem List   Diagnosis Date Noted   Graves disease 07/16/2023   Multinodular goiter 07/16/2023   Right carpal tunnel syndrome 07/10/2023   Left carpal tunnel syndrome 07/10/2023   Pain of Right Knee 07/06/2023   History of breast reconstruction 05/15/2023   Pure hypercholesterolemia 03/30/2023   Acute meniscal tear, medial, right, initial encounter 11/12/2022   Plica syndrome of right knee 11/12/2022   Chondromalacia, right knee 11/12/2022   Depression 08/13/2022   BMI 40.0-44.9, adult (HCC) 08/13/2022   Obesity, Beginning BMI 08/13/2022   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   Boil of buttock, Left 12/24/2021   Hyperthyroidism 07/22/2021   Low serum thyroid stimulating hormone (TSH) 06/12/2021   Other fatigue 05/23/2021   Hemoglobin low 05/23/2021   Polyphagia 05/16/2020   Vitamin D deficiency 05/16/2020   At risk for osteoporosis 05/16/2020   Prediabetes 05/01/2020   Elevated blood pressure reading 05/01/2020   At risk for impaired metabolic function 05/01/2020   Breast asymmetry following reconstructive surgery 09/20/2019   Essential hypertension 08/17/2018   Class 3 severe obesity with serious comorbidity and body mass index (BMI) of 40.0 to 44.9 in adult [facility]) 09/17/2017   Acquired absence of left breast 09/15/2017   Malignant neoplasm of upper-inner quadrant of left breast in female, 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   Healthcare maintenance 03/25/2017   Malignant 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, estrogen receptor positive (HCC) 01/23/2017   Carcinoma of  breast metastatic to axillary lymph node, left (HCC) 01/23/2017    ONSET DATE: chronic  REFERRING DIAG: M54.12 (ICD-10-CM) - Radiculopathy, cervical region   THERAPY DIAG:  Paresthesia of skin  Other lack of coordination  Muscle weakness (generalized)  Rationale for Evaluation and Treatment: Rehabilitation  PERTINENT HISTORY: OT eval and treat; LUE radiculopathy She states her Lt arm has numbness since starting chemo in 2018.   She is a Engineer, civil (consulting) on PCU.  She states numbness is worst in night/early morning. She is currently in PT for for Rt knee and Achilles pain.   PRECAUTIONS: None  RED FLAGS: None   WEIGHT BEARING RESTRICTIONS: No   SUBJECTIVE:   SUBJECTIVE STATEMENT: She shows up 13 min late today. She states numbness is better in fingers but has a pain in Lt palmjust distal to carpal tunnel, volarly. Marland Kitchen     PAIN:   Are you having pain?  None at rest today though she has had mild pain in the left volar palm area.  She states that her tingling and numbness is now better.  FALLS: Has patient fallen in last  6 months? No  PLOF: Independent  PATIENT GOALS: To improve paresthesia in left hand and arm  NEXT MD VISIT: As needed    OBJECTIVE: (All objective assessments below are from initial evaluation on: 07/24/23 unless otherwise specified.)   HAND DOMINANCE: Right   ADLs: Overall ADLs: States decreased ability to grab, hold household objects, pain and difficulty to open containers, perform FMS tasks (manipulate fasteners on clothing)   FUNCTIONAL OUTCOME MEASURES: Eval: Quick DASH 20% impairment today  (Higher % Score  =  More Impairment)     UPPER EXTREMITY ROM     Shoulder to Wrist AROM Right eval Left eval Lt 08/03/23  Forearm supination 72 70 75  Forearm pronation  90 90   Wrist flexion 78 75 80  Wrist extension 77 73 65  (Blank rows = not tested)   Hand AROM Left eval  Full Fist Ability (or Gap to Distal Palmar Crease) full  Thumb Opposition   (Kapandji Scale)  10/10  (Blank rows = not tested)   UPPER EXTREMITY MMT:      MMT Right 07/24/23 Left 07/24/23  FPL flexion 5/5 4/5  (Blank rows = not tested)  HAND FUNCTION: 08/03/23: Grip Rt: 70#, Lt: 60#   Eval: Not overly weak in Lt hand  Grip strength Right: 52 lbs, Left: 56 lbs  Tip pinch Rt: 11#; Lt: 9#  COORDINATION: 08/03/23:  9 Hole Peg Test Right: TBDsec, Left: TBD sec (TBD sec is WFL)   Eval: She states occasionally dropping objects with her left hand and some fine motor skill coordination issues.  Details will be tested specifically next session as able   SENSATION: 08/03/23:   S2PDT:  Lt IF: 4-44mm, thumb: 3-74mm, SF: 5mm;  Rt IF: 4.68mm, SF: 5mm  Eval:  Light touch intact today, though diminished in left hand median nerve distribution distally compared to right hand  OBSERVATIONS:   Eval: Due to reports of increased night paresthesia, weakness with FPL, no significant muscle atrophy but very very mild and left thenar eminence compared to the right-she presents like a chronic median nerve compression largely at the carpal tunnel and possibly some to the biceps and pronator areas.  She does have some palpable muscle spasms in these areas.  She should respond nicely to activity modifications and HEP   TODAY'S TREATMENT:  08/03/23: She starts with performing active range of motion for review of measures which does show some progress but unfortunately not in wrist extension so her home exercise program was reviewed in depth, OT performing each exercise with her and ensuring that she understands the prayer stretch especially.  Sensation testing today shows that she has equal bilateral sensation and fairly equal through both median and ulnar nerve distributions.  She does not want to try any dry needling today, though her left bicep and forearm area does remain tight as seen during the "fridge door" stretch.  She states understanding her home exercise program and as she has  manage for 2 weeks by herself, OT will see her back in an additional 2 weeks to do a progress note and check all of her goals.  She is in agreement with this plan.  OT did also remind her to avoid things that cause nerve compression and she states that it is difficult but she is more body aware now.  Her pain complaint in the volar palm today seems to just be possibly from the increased tightness in wrist extension or even possibly from overstretching and that "  prayer stretch", but that is unlikely as her motion is stiff for there today.  She was encouraged to massage her palm and continue prayer stretch for this small amount of pain that she was experiencing.  Exercises - Median Nerve Flossing  - 2-3 x daily - 5-10 reps - Fridge Door Stretch  - 4 x daily - 3-5 reps - 15 sec hold - Forearm Supination Stretch  - 3-4 x daily - 3-5 reps - 15 sec hold - Wrist Prayer Stretch  - 4 x daily - 3-5 reps - 15 sec hold  PATIENT EDUCATION: Education details: See tx section above for details  Person educated: Patient Education method: Verbal Instruction, Teach back, Handouts  Education comprehension: States and demonstrates understanding, Additional Education required    HOME EXERCISE PROGRAM: Access Code: [email or web address] URL: [email or web address] Date: 07/24/2023 Prepared by: Fannie Knee   GOALS: Goals reviewed with patient? Yes   SHORT TERM GOALS: (STG required if POC>30 days) Target Date: 07/31/2023  Pt will obtain protective, custom orthotic. Goal status: TBD/PRN  2.  Pt will demo/state understanding of initial HEP to improve pain levels and prerequisite motion. Goal status: 08/03/23: MET 08/03/23   LONG TERM GOALS: Target Date: 08/21/23  Pt will improve functional ability by decreased impairment per Quick DASH assessment from 20% to 10% or better, for better quality of life. Goal status: INITIAL  2.  Pt will improve A/ROM in left wrist extension from 73 degrees to at least 75  degrees, to show improvements in wrist extensibility for looseness to the carpal tunnel for better motion for tasks like reach and grasp.  Goal status: INITIAL  3.  Pt will improve strength in left thumb FPL flexion from 4/5 MMT to at least 4+/5 MMT to have increased functional ability to carry out selfcare and higher-level homecare tasks with less difficulty. Goal status: INITIAL  4.  Pt will improve coordination skills in left hand, as seen by within functional limit score on nine-hole peg testing to have increased functional ability to carry out fine motor tasks (fasteners, etc.) and more complex, coordinated IADLs (meal prep, sports, etc.).  Goal status: INITIAL-TBD baseline measures next session  5.  Pt will decrease tingling at worst "only intermittent and mild " or better to have better occupational participation in daily roles. Goal status: INITIAL   ASSESSMENT:  CLINICAL IMPRESSION: 08/03/23: She is making subjective and objective improvements like motion and grip strength.  See back in 2 weeks for progress note and if all is well we can discharge.   Eval: Patient is a 53 y.o. female who was seen today for occupational therapy evaluation for paresthesia in the left nondominant hand likely caused by median nerve compression through the bicep, pronator, carpal tunnel.  It is exacerbated by her activities especially at night sleeping postures.  She will benefit from outpatient occupational therapy to decrease symptoms and increase quality of life.   PLAN:  OT FREQUENCY: 1x/week  OT DURATION: 4 weeks through 08/21/2023 and up to 4 total visits as needed  PLANNED INTERVENTIONS: 97168 OT Re-evaluation, 97535 self care/ADL training, 16109 therapeutic exercise, 97530 therapeutic activity, 97112 neuromuscular re-education, 97140 manual therapy, 97035 ultrasound, 97010 moist heat, 97010 cryotherapy, 97760 Orthotics management and training, 60454 Splinting (initial encounter), [id number] Subsequent  splinting/medication, Dry needling, coping strategies training, and patient/family education  RECOMMENDED OTHER SERVICES: None now  CONSULTED AND AGREED WITH PLAN OF CARE: Patient  PLAN FOR NEXT SESSION:   To progress note in 2 weeks to determine  the need for any additional therapy.   Fannie Knee, OTR/L, CHT 08/03/2023, 9:24 AM

## 2023-07-31 ENCOUNTER — Other Ambulatory Visit: Payer: Self-pay | Admitting: Orthopaedic Surgery

## 2023-07-31 ENCOUNTER — Other Ambulatory Visit (HOSPITAL_COMMUNITY): Payer: Self-pay

## 2023-07-31 ENCOUNTER — Encounter: Payer: Self-pay | Admitting: Orthopaedic Surgery

## 2023-07-31 MED ORDER — DICLOFENAC SODIUM 75 MG PO TBEC
75.0000 mg | DELAYED_RELEASE_TABLET | Freq: Two times a day (BID) | ORAL | 0 refills | Status: DC | PRN
  Filled 2023-07-31: qty 60, 30d supply, fill #0

## 2023-07-31 NOTE — Telephone Encounter (Signed)
 sent

## 2023-08-03 ENCOUNTER — Ambulatory Visit: Payer: 59 | Admitting: Rehabilitative and Restorative Service Providers"

## 2023-08-03 ENCOUNTER — Encounter: Payer: Self-pay | Admitting: Rehabilitative and Restorative Service Providers"

## 2023-08-03 DIAGNOSIS — R278 Other lack of coordination: Secondary | ICD-10-CM | POA: Diagnosis not present

## 2023-08-03 DIAGNOSIS — R202 Paresthesia of skin: Secondary | ICD-10-CM

## 2023-08-03 DIAGNOSIS — M6281 Muscle weakness (generalized): Secondary | ICD-10-CM | POA: Diagnosis not present

## 2023-08-07 DIAGNOSIS — E119 Type 2 diabetes mellitus without complications: Secondary | ICD-10-CM | POA: Diagnosis not present

## 2023-08-10 ENCOUNTER — Ambulatory Visit: Payer: 59 | Admitting: Rehabilitative and Restorative Service Providers"

## 2023-08-10 ENCOUNTER — Encounter: Payer: 59 | Admitting: Rehabilitative and Restorative Service Providers"

## 2023-08-10 ENCOUNTER — Encounter: Payer: Self-pay | Admitting: Rehabilitative and Restorative Service Providers"

## 2023-08-10 DIAGNOSIS — M6281 Muscle weakness (generalized): Secondary | ICD-10-CM | POA: Diagnosis not present

## 2023-08-10 DIAGNOSIS — M25661 Stiffness of right knee, not elsewhere classified: Secondary | ICD-10-CM

## 2023-08-10 DIAGNOSIS — M25571 Pain in right ankle and joints of right foot: Secondary | ICD-10-CM | POA: Diagnosis not present

## 2023-08-10 DIAGNOSIS — R2689 Other abnormalities of gait and mobility: Secondary | ICD-10-CM

## 2023-08-10 DIAGNOSIS — M25671 Stiffness of right ankle, not elsewhere classified: Secondary | ICD-10-CM | POA: Diagnosis not present

## 2023-08-10 DIAGNOSIS — G8929 Other chronic pain: Secondary | ICD-10-CM | POA: Diagnosis not present

## 2023-08-10 DIAGNOSIS — M25561 Pain in right knee: Secondary | ICD-10-CM

## 2023-08-10 NOTE — Addendum Note (Signed)
 Addended by: Narda Amber R on: 08/10/2023 10:12 AM   Modules accepted: Orders

## 2023-08-10 NOTE — Therapy (Signed)
 OUTPATIENT PHYSICAL THERAPY  TREATMENT    Patient Name: Mackenzie [name] MRN: [id number] DOB:[birth year], 53 y.o., female Today's Date: 08/10/2023   END OF SESSION:  PT End of Session - 08/10/23 0932     Visit Number 8    Number of Visits 13    Date for PT Re-Evaluation 09/04/23    Authorization Type [facility] AET[employer]    Authorization Time Period applied for 6 more week on 05/20/24    Progress Note Due on Visit 20    PT Start Time 0926    PT Stop Time 1019    PT Time Calculation (min) 53 min    Activity Tolerance Patient tolerated treatment well    Behavior During Therapy [facility] for tasks assessed/performed                    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, left (HCC) 01/23/2017   Cellulitis of right leg    Edema, lower extremity    GERD (gastroesophageal reflux disease)    Healthcare maintenance 03/25/2017   Herpesviral infection    Hot flashes 03/2017   Hyperlipidemia 03/20/2016   Impaired fasting glucose    Morbid obesity with body mass index (BMI) of 40.0 to 49.9 (HCC) 09/17/2017   Neoplasm of cervix    Osteoarthritis of knee    Other chest pain    Other specified disorders of veins    Polyneuropathy    Postmenopausal atrophic vaginitis    Pre-diabetes 07/26/2018   Hgb A1C elevated on 07/26/2018, Gestational Diabetes 2012   Pure hypercholesterolemia 03/30/2023   Seasonal allergies 2012   seasonal allergies causes allergic rhinitis and itchy, dry eyes per pt   Stress incontinence    Tachycardia    Thyrotoxicosis    Vitamin D deficiency 05/2015   Past Surgical History:  Procedure Laterality Date   BREAST RECONSTRUCTION WITH PLACEMENT OF TISSUE EXPANDER AND FLEX HD  (ACELLULAR HYDRATED DERMIS) Left 08/27/2017   Procedure: LEFT BREAST RECONSTRUCTION WITH PLACEMENT OF TISSUE EXPANDER AND FLEX HD;  Surgeon: Peggye Form, DO;  Location: [facility];  Service: Plastics;  Laterality: Left;   CESAREAN SECTION     x2   KNEE ARTHROSCOPY WITH MEDIAL MENISECTOMY Right 11/12/2022   Procedure: RIGHT KNEE ARTHROSCOPY, PARTIAL MEDIAL MENISCECTOMY;  Surgeon: Tarry Kos, MD;  Location: [facility];  Service: Orthopedics;  Laterality: Right;   LATISSIMUS FLAP TO BREAST Left 11/03/2018   LATISSIMUS FLAP TO BREAST Left 11/03/2018   Procedure: LATISSIMUS FLAP TO LEFT BREAST;  Surgeon: Peggye Form, DO;  Location: [facility];  Service: Plastics;  Laterality: Left;   MASTECTOMY MODIFIED RADICAL Left 08/27/2017   MASTECTOMY MODIFIED RADICAL Left 08/27/2017   Procedure: LEFT MODIFIED RADICAL MASTECTOMY;  Surgeon: Harriette Bouillon, MD;  Location: [facility];  Service: General;  Laterality: Left;   MASTOPEXY Right 03/03/2019   Procedure: RIGHT BREAST MASTOPEXY/REDUCTION;  Surgeon: Peggye Form, DO;  Location: [facility];  Service: Plastics;  Laterality: Right;  3 hours, please   PORT-A-CATH REMOVAL Right 08/27/2017   Procedure: REMOVAL PORT-A-CATH RIGHT CHEST;  Surgeon: Harriette Bouillon, MD;  Location: [facility];  Service: General;  Laterality: Right;   PORTACATH PLACEMENT Right 01/28/2017   Procedure: INSERTION PORT-A-CATH WITH ULTRASOUND;  Surgeon: Harriette Bouillon, MD;  Location: [facility];  Service: General;  Laterality: Right;   REDUCTION MAMMAPLASTY Right    REMOVAL OF TISSUE EXPANDER AND PLACEMENT OF IMPLANT Left 03/03/2019   Procedure: REMOVAL OF TISSUE EXPANDER AND PLACEMENT OF EXPANDER;  Surgeon: Peggye Form, DO;  Location: [facility];  Service: Plastics;  Laterality: Left;   REMOVAL OF TISSUE EXPANDER AND PLACEMENT OF IMPLANT Left 05/25/2019   Procedure: LEFT BREAST REMOVAL OF TISSUE EXPANDER AND PLACEMENT OF  IMPLANT;  Surgeon: Peggye Form, DO;  Location: [facility];  Service: Plastics;  Laterality: Left;   TISSUE EXPANDER PLACEMENT Left 11/03/2018   Procedure: PLACEMENT OF TISSUE EXPANDER LEFT BREAST;  Surgeon: Peggye Form, DO;  Location: [facility];  Service: Plastics;  Laterality: Left;  Total case time is 3.5 hours   TUBAL LIGATION Bilateral 01/21/2011   Patient Active Problem List   Diagnosis Date Noted   Graves disease 07/16/2023   Multinodular goiter 07/16/2023   Right carpal tunnel syndrome 07/10/2023   Left carpal tunnel syndrome 07/10/2023   Pain of Right Knee 07/06/2023   History of breast reconstruction 05/15/2023   Pure hypercholesterolemia 03/30/2023   Acute meniscal tear, medial, right, initial encounter 11/12/2022   Plica syndrome of right knee 11/12/2022   Chondromalacia, right knee 11/12/2022   Depression 08/13/2022   BMI 40.0-44.9, adult (HCC) 08/13/2022   Obesity, Beginning BMI 08/13/2022   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   Boil of buttock, Left 12/24/2021   Hyperthyroidism 07/22/2021   Low serum thyroid stimulating hormone (TSH) 06/12/2021   Other fatigue 05/23/2021   Hemoglobin low 05/23/2021   Polyphagia 05/16/2020   Vitamin D deficiency 05/16/2020   At risk for osteoporosis 05/16/2020   Prediabetes 05/01/2020   Elevated blood pressure reading 05/01/2020   At risk for impaired metabolic function 05/01/2020   Breast asymmetry following reconstructive surgery 09/20/2019   Essential hypertension 08/17/2018   Class 3 severe obesity with serious comorbidity and body mass index (BMI) of 40.0 to 44.9 in adult 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, left (HCC) 01/23/2017    PCP: Dois Davenport, MD  REFERRING PROVIDER: Tarry Kos, MD  REFERRING DIAG: M17.11 (ICD-10-CM) - Unilateral primary osteoarthritis, right knee   THERAPY DIAG:  Chronic pain of right knee  Pain in right ankle and joints of right foot  Other abnormalities of gait and mobility  Muscle weakness (generalized)  Stiffness of right ankle, not elsewhere classified  Stiffness of right knee, not elsewhere classified  Rationale for Evaluation and Treatment: Rehabilitation  ONSET DATE: 05/26/2023 referral to PT  SUBJECTIVE:   SUBJECTIVE STATEMENT: Pt indicated knee has "been pretty good"  Pt indicated achilles complaints "giving her problems."  Pt. Indicated achilles complaints noted with getting up in the morning and also with walking.  Stretching does help some.  Hasn't been able to get into therapy due to clinic schedule busy.   PERTINENT HISTORY: Right knee arthroscopy 11/12/22, History of Lt breast cancer, GERD, hyperlipidemia,   PAIN:  NPRS scale: 8/10 at most for  achilles.  Current 5/10 Pain location: Achilles.  Pain description: knee at times sharp / throbbing, general dull ache;  Achilles is pulling, sharp stretch Aggravating factors: walking esp stairs, getting up when stiff,  Relieving factors: meds, elevate, rest  PRECAUTIONS: NO BP LUE  WEIGHT BEARING RESTRICTIONS: No  FALLS:  Has patient fallen in last 6 months? No  LIVING ENVIRONMENT: Lives with: lives with their daughter 12yo Lives in: House Stairs: Yes: External: 3 steps; none;  sunken den single step no rail.   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   Next MD visit:  not scheduled  OBJECTIVE:  DIAGNOSTIC  FINDINGS: 04/21/2023 right ankle X-ray showed Retrocalcaneal osteophyte formation  PATIENT SURVEYS:  07/22/23: update FOTO 61%  07/20/2023:  update FOTO 59%  Eval: FOTO intake:  52%  predicted:  64%  COGNITION: Eval:  Overall cognitive status: WFL    SENSATION: Eval: WFL  PALPATION: 08/10/2023:  Positive [facility] testing for Rt mid point achilles tendon.   Eval Right knee tenderness to palpation medial joint line and quadriceps tendon.  Denies tenderness to Quad muscle, Patella Tendon or other (not medial) areas of joint line.  Right ankle tenderness to palpation Achilles Tendon. Denies tenderness to Gastroc belly, Plantar Fascia or with Great Toe extension.   LOWER EXTREMITY ROM:   ROM Right eval Right 06/29/23 Rt 07/07/23 supine Rt Supine Active Right 08/10/2023  Hip flexion       Hip extension       Hip abduction       Hip adduction       Hip internal rotation       Hip external rotation       Knee flexion Supine A: 105* P: 112* with pain medial knee / distal quad Supine P: 113* A: 120 P; 122 A: 122   Knee extension Supine A: quad set -5* with pain in proximal gastroc Supine P: 0* A: 0 A:0   Ankle dorsiflexion Supine with Knee extended  A: +9* P: +10* Knee pain/none Achielles Supine with knee flexed A: 16* P: 20* Standing DF with knee ext P: +19* AA: 10 A:10 10 AROM in seated 90 deg knee flexion  4 AROM in long sitting 0 deg knee ROM  Ankle plantarflexion Supine A: 21* P: 27* Knee pain/none Achielles    45 AROM in seated 90 deg knee flexion  Ankle inversion       Ankle eversion        (Blank rows = not tested)  LOWER EXTREMITY MMT:  MMT Right eval Left eval Rt 07/22/23 Left 07/22/23  Hip flexion      Hip extension      Hip abduction      Hip adduction      Hip internal rotation      Hip external rotation      Knee flexion 4/5 4+ 4+ 5  Knee extension 4/5 4+ 4+ 4+  Ankle dorsiflexion      Ankle plantarflexion 3-/5 3+ (tested standing  with calf raise)    Ankle inversion      Ankle eversion       (Blank rows = not tested)  FUNCTIONAL TESTS:  07/07/23:  Rt SLS: 1st attempt 13.2 seconds, 2nd attempt: 23.7, 3rd attempt: 33.9 seconds Left SLS: 1st  attempt 30.1 seconds 5 times sit to stand: 11.6 sec  Eval: 18 inch chair transfer: able to arise without UE assist but weight shifts to left 5 times sit to stand: 16.53 sec Lt SLS: 1st attempt 3.96 sec 2nd attempt 11.84 sec Rt SLS: 1st attempt 7.00 sec 2nd attempt 12.89 sec  GAIT: 08/10/2023: Independent ambulation.   Eval:  Distance walked: 100'  Assistive device utilized: Single point cane Level of assistance: SBA Comments: antalgic with decreased stance duration RLE, right knee flexed in stance, early heel rise in terminal stance with little to no push-off noted.                      TODAY'S TREATMENT                                                                          DATE: 08/10/2023:  Therex: Recumbent bike lvl 4 10 mins  , seat 6  Double leg up, Rt leg lowering slowly 3x 10 PF eccentric loading  Incline gastroc stretch 30 sec x 5, soleus stretch 30 sec x 5 with bent knee  Education given about eccentric loading research for achilles tendon irritations.  Emphasis on 2x/day routine use.  Additional time spent in review of Achilles specific intervention to clean up and simplify home progress to match research driven intervention.  Question and answer time and review.   Neuro Re-ed Tandem stance on airex foam without shoes in // bars with occasional HHA 1 min x 2 bilateral Anterior /posterior ankle strategy weight shift "church pew" exercise on airex foam in // bars with occasional HHA on bar 30x each way   Manual Posterior glides G4 Rt talocrural joint for DF mobility gains.    TODAY'S TREATMENT                                                                          DATE: 07/22/2023:  Therex: Recumbent bike, seat 6,  lvl 4, x 9 mins  Calf raise on leg press  machine double leg up, Rt leg lowering slowly 2 x 10 37# for eccentric loading Incline gastroc stretch 30 sec x 3  All MMT and ROM updated this visit see charts above  TherActivity (improve stairs, ambulation, tranfers and other functional movement) Leg press double leg 106# 2 x 10, single leg 62 lbs x 15  performed bilaterally Step downs on 4 inch step x 15 bil LE's c UE support Step ups on 6 inch step x 15 bil LE's c UE support TRX squats to chair height  TODAY'S TREATMENT  DATE: 07/20/2023:  Therex: Recumbent bike lvl 4 9 mins  Calf raise on leg press machine double leg up, Rt leg lowering slowly 2 x 10 37 lbs for eccentric loading Incline gastroc stretch 30 sec x 3  Additional time in review of existing HEP verbally with cues for continued use of stretching for tightness and strengthening for long term gains.   TherActivity (improve stairs, ambulation, tranfers and other functional movement) Leg press double leg 100 lbs x 15, single leg 50 lbs x15 performed bilaterally  Neuro Re-ed Tandem stance 1 min x 2 bilateral on foam with occasional HHA on bar.  Tandem ambulation on foam fwd/back 6 ft in // bars with occasional HHA on bar x 5 each way    TODAY'S TREATMENT                                                                          DATE: 07/14/2023:  Therex: Recumbent bike: level 4 x 9 minutes Heel drop off stretch 2x30sec; pt had increased pain with activity instructed on only going as far into stretch as tolerated. Seated gastroc + plantar fascia stretch with towel 3x30sec  Therapeutic Activities: 2 flights of stair negotiation with step to pattern with affected and non affected leg, step through pattern, and use of bilateral and single railings. Required demonstration and verbal cues for proper form. Patient educated on protection of leg vs using it in order to strengthen the leg for tolerance   Backwards  walking x2 bouts: initial steps were unsteady however once comfortable patient demonstrated increased body awareness and improved posture/mechanics   PATIENT EDUCATION:  Access Code: [email or web address] URL: [email or web address] Date: 08/10/2023 Prepared by: Chyrel Masson  Exercises - Kettlebell Deadlift  - 1 x daily - 7 x weekly - 3 sets - 10 reps - Step Up  - 1 x daily - 7 x weekly - 3 sets - 10 reps - Lateral Step Down  - 1 x daily - 7 x weekly - 3 sets - 10 reps - Single Leg Squat with Counter Support  - 1 x daily - 7 x weekly - 1-2 sets - 10 reps - Single Leg Balance on Foam Pad  - 1 x daily - 7 x weekly - 1-2 sets - 10 reps - Single Leg Stance with 3-Way Kick on Foam  - 1 x daily - 7 x weekly - 1-2 sets - 10 reps - Seated Hamstring Stretch  - 1 x daily - 7 x weekly - 2-3 reps - 30 seconds hold - Modified Thomas Stretch  - 1 x daily - 7 x weekly - 1 sets - 2-3 reps - 30 seconds hold - Hooklying Hamstring Stretch with Strap  - 1 x daily - 7 x weekly - 1 sets - 2-3 reps - 30 seconds hold - Supine ITB Stretch with Strap  - 1 x daily - 7 x weekly - 1 sets - 2-3 reps - 30 seconds hold - Supine Bridge with Mini Swiss Ball Between Knees  - 1 x daily - 7 x weekly - 2 sets - 10 reps - 5 seconds hold - Clamshell with Resistance  - 1 x daily - 7 x weekly - 4 sets - 10  reps - Seated Straight Leg Raise  - 1 x daily - 7 x weekly - 3 sets - 10 reps - Squat with Chair Touch  - 1 x daily - 7 x weekly - 10 reps - standing alternating legs hip abduction on foam pad  - 1 x daily - 5 x weekly - 1 sets - 10 reps - 5 seconds hold - standing alternating legs hip extension on foam pad  - 1 x daily - 5 x weekly - 1 sets - 10 reps - 5 seconds hold - standing alternating legs hip flexion on foam pad  - 1 x daily - 5 x weekly - 1 sets - 10 reps - 5 seconds hold - Carioca with Counter Support  - 1 x daily - 7 x weekly - 1 sets - 15 reps - Standing Eccentric Heel Raise  - 2 x daily - 7 x weekly - 3 sets - 10  reps - standing calf stretch with forefoot on small step or brick  - 2-3 x daily - 7 x weekly - 1 sets - 2-3 reps - 30 seconds hold - Standing Soleus Stretch on Foam 1/2 Roll  - 2-3 x daily - 7 x weekly - 1 sets - 2-3 reps - 30 seconds hold - Standing Dorsiflexion Self-Mobilization on Step  - 1-2 x daily - 7 x weekly - 1 sets - 10 reps - 2-3 hold  Update above removed water exercise(Pt had on previous handout).  Achilles focused exercise at bottom of this list.  ASSESSMENT: CLINICAL IMPRESSION: Pt to continue to benefit from skilled PT services to address Rt achilles based symptoms.  Focused in eccentric lowering routinely 2x/day with stretching post activity to help improve symptoms. Reviewed HEP and printed out simplified version for achilles.   OBJECTIVE IMPAIRMENTS: Abnormal gait, decreased activity tolerance, decreased balance, decreased endurance, decreased knowledge of condition, decreased knowledge of use of DME, decreased mobility, difficulty walking, decreased ROM, decreased strength, impaired flexibility, obesity, and pain.   ACTIVITY LIMITATIONS: carrying, lifting, bending, sitting, standing, squatting, sleeping, stairs, transfers, and locomotion level  PARTICIPATION LIMITATIONS: meal prep, cleaning, community activity, and occupation  PERSONAL FACTORS: Fitness, Past/current experiences, Time since onset of injury/illness/exacerbation, and 3+ comorbidities: see PMH  are also affecting patient's functional outcome.   REHAB POTENTIAL: Good  CLINICAL DECISION MAKING: Stable/uncomplicated  EVALUATION COMPLEXITY: Moderate   GOALS: Goals reviewed with patient? Yes  SHORT TERM GOALS: (target date for Short term goals 06/29/2023)   1.  Patient will demonstrate independent use of home exercise program to maintain progress from in clinic treatments. Baseline: See objective data Goal status: MET 06/29/2023  2. PROM right knee 0* - 110* Baseline: See objective data Goal status: MET  06/29/2023  3. PROM right ankle Dorsiflexion +15* with knee extended Baseline: See objective data Goal status: MET 06/29/2023  LONG TERM GOALS: (target dates for all long term goals  09/04/2023 )   1. Patient will demonstrate/report pain at worst less than or equal to 2/10 to facilitate minimal limitation in daily activity secondary to pain symptoms. Baseline: See objective data Goal status: Ongoing 07/22/23 (5-6/10 in pt's knee and achilles)   2. Patient will demonstrate independent use of home exercise program to facilitate ability to maintain/progress functional gains from skilled physical therapy services. Baseline: See objective data Goal status: Ongoing 05/20/24    3. Patient will demonstrate FOTO outcome > or = 64 % to indicate reduced disability due to condition. Baseline: See objective data Goal status:  On-going 07/22/23: (61%)   4.  Patient will demonstrate right knee and ankle LE MMT 5/5 throughout to faciltiate usual transfers, stairs, squatting at [facility] for daily life.  Baseline: See objective data Goal status: Ongoing 07/22/2023    5.  Patient able to negotiate stairs with single rail alternating pattern modified independent with knee pain </= 2/10. Baseline: See objective data Goal status:   Partially met  07/22/2023    PLAN:  PT FREQUENCY:  1x/week  PT DURATION: 6 weeks  PLANNED INTERVENTIONS: 97164- PT Re-evaluation, 97110-Therapeutic exercises, 97530- Therapeutic activity, 97112- Neuromuscular re-education, 97535- Self Care, [id number]- Manual therapy, [phone]- Gait training, Patient/Family education, Balance training, Stair training, Taping, Dry Needling, Joint mobilization, DME instructions, Cryotherapy, and Moist heat  PLAN FOR NEXT SESSION:  Check on achilles eccentric loading routine 2x/day from HEP.    Re-cert performed on 07/22/23   Chyrel Masson, PT, DPT, OCS, ATC 08/10/23  10:17 AM

## 2023-08-14 ENCOUNTER — Other Ambulatory Visit (INDEPENDENT_AMBULATORY_CARE_PROVIDER_SITE_OTHER): Payer: Self-pay

## 2023-08-14 ENCOUNTER — Ambulatory Visit: Admitting: Physician Assistant

## 2023-08-14 DIAGNOSIS — M25562 Pain in left knee: Secondary | ICD-10-CM | POA: Diagnosis not present

## 2023-08-14 DIAGNOSIS — G8929 Other chronic pain: Secondary | ICD-10-CM

## 2023-08-14 MED ORDER — METHYLPREDNISOLONE ACETATE 40 MG/ML IJ SUSP
40.0000 mg | INTRAMUSCULAR | Status: AC | PRN
  Administered 2023-08-14: 40 mg via INTRA_ARTICULAR

## 2023-08-14 NOTE — Progress Notes (Signed)
 Office Visit Note   Patient: Mackenzie [name]           Date of Birth: [birth year]           MRN: [id number] Visit Date: 08/14/2023              Requested by: Dois Davenport, MD [address] [place] [address] [place],  [place] [address] PCP: Dois Davenport, MD  Chief Complaint  Patient presents with  . Left Knee - Pain      HPI: Patient is a pleasant 53 year old woman who is a patient of Dr. Roda Shutters.  She recently had surgery arthroscopic surgery on her right knee.  She thinks she has been overcompensating on her left she said that her knee buckled and she is complaining of pain in the back of the knee no anterior pain no fever or chills  Assessment & Plan: Visit Diagnoses:  1. Chronic pain of left knee     Plan: Exam consistent with probable ruptured Baker's cyst.  She has had cortisone injections into the left knee before.  I offered her 1 today.  She also has been authorized for viscosupplementation.  Because of her pain today I am advocating a steroid injection today she can return in 2 weeks and have a viscosupplementation with Mardella Layman  Follow-Up Instructions: No follow-ups on file.   Ortho Exam  Patient is alert, oriented, no adenopathy, well-dressed, normal affect, normal respiratory effort. Left knee no effusion no erythema compartments are soft and compressible she has a negative Homans' sign no calf tenderness she has more tenderness posteriorly in the popliteal fossa no real tenderness along the joint line of the patellofemoral joint no evidence of infection  Imaging: XR KNEE 3 VIEW LEFT Result Date: 08/14/2023 Radiographs of her left knee do demonstrate some very early degenerative changes although she is has still fairly preserved joint spacing little less on the medial side   No images are attached to the encounter.  Labs: Lab Results  Component Value Date   HGBA1C 6.1 (H) 06/08/2023   HGBA1C 6.0 (H) 01/19/2023   HGBA1C 5.8 (H) 09/03/2022   ESRSEDRATE 5  07/07/2022   CRP <1 07/07/2022   LABURIC 4.2 06/09/2023   LABURIC 3.5 03/01/2021   LABURIC 3.5 12/11/2020   REPTSTATUS 06/29/2018 FINAL 06/28/2018   CULT <10,000 COLONIES/mL INSIGNIFICANT GROWTH (A) 06/28/2018     Lab Results  Component Value Date   ALBUMIN 4.5 06/09/2023   ALBUMIN 4.8 06/08/2023   ALBUMIN 4.2 03/31/2023    Lab Results  Component Value Date   MG 2.2 06/09/2023   MG 2.1 01/19/2023   MG 2.0 09/03/2022   Lab Results  Component Value Date   VD25OH 65.8 06/08/2023   VD25OH 76.5 01/19/2023   VD25OH 81.2 09/03/2022    No results found for: "PREALBUMIN"    Latest Ref Rng & Units 06/09/2023    9:06 AM 06/08/2023   10:36 AM 01/05/2023    8:46 AM  CBC EXTENDED  WBC 4.0 - 10.5 K/uL 7.0  6.5  4.8   RBC 3.87 - 5.11 MIL/uL 4.77  5.17  4.40   Hemoglobin 12.0 - 15.0 g/dL 19.1  47.8  29.5   HCT 36.0 - 46.0 % 39.9  44.4  37.9   Platelets 150 - 400 K/uL 247  285  205   NEUT# 1.7 - 7.7 K/uL 4.7  4.3  2.5   Lymph# 0.7 - 4.0 K/uL 1.3  1.3  1.6  There is no height or weight on file to calculate BMI.  Orders:  Orders Placed This Encounter  Procedures  . XR KNEE 3 VIEW LEFT   No orders of the defined types were placed in this encounter.    Procedures: Large Joint Inj: L knee on 08/14/2023 4:04 PM Indications: pain and diagnostic evaluation Details: 25 G 1.5 in needle, anteromedial approach  Arthrogram: No  Medications: 40 mg methylPREDNISolone acetate 40 MG/ML Outcome: tolerated well, no immediate complications Procedure, treatment alternatives, risks and benefits explained, specific risks discussed. Consent was given by the patient.    Clinical Data: No additional findings.  ROS:  All other systems negative, except as noted in the HPI. Review of Systems  Objective: Vital Signs: LMP 11/28/2016   Specialty Comments:  No specialty comments available.  PMFS History: Patient Active Problem List   Diagnosis Date Noted  . Graves disease  07/16/2023  . Multinodular goiter 07/16/2023  . Right carpal tunnel syndrome 07/10/2023  . Left carpal tunnel syndrome 07/10/2023  . Pain of Right Knee 07/06/2023  . History of breast reconstruction 05/15/2023  . Pure hypercholesterolemia 03/30/2023  . Acute meniscal tear, medial, right, initial encounter 11/12/2022  . Plica syndrome of right knee 11/12/2022  . Chondromalacia, right knee 11/12/2022  . Depression 08/13/2022  . BMI 40.0-44.9, adult (HCC) 08/13/2022  . Obesity, Beginning BMI 08/13/2022  . 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  . Boil of buttock, Left 12/24/2021  . Hyperthyroidism 07/22/2021  . Low serum thyroid stimulating hormone (TSH) 06/12/2021  . Other fatigue 05/23/2021  . Hemoglobin low 05/23/2021  . Polyphagia 05/16/2020  . Vitamin D deficiency 05/16/2020  . At risk for osteoporosis 05/16/2020  . Prediabetes 05/01/2020  . Elevated blood pressure reading 05/01/2020  . At risk for impaired metabolic function 05/01/2020  . Breast asymmetry following reconstructive surgery 09/20/2019  . Essential hypertension 08/17/2018  . Class 3 severe obesity with serious comorbidity and body mass index (BMI) of 40.0 to 44.9 in adult (HCC) 09/17/2017  . 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 Malignant neoplasm of upper-inner quadrant of left breast in female, 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  . Healthcare maintenance 03/25/2017  . Malignant 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, estrogen receptor positive (HCC) 01/23/2017  . 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 Abnormal glucose 2018  . 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 Allergic rhinitis 2012  . Anemia 01/27/2017   prior to starting chemotherapy  . BMI 40.0-44.9, adult (HCC)   . Breast cancer (HCC) 01/14/2017   Left breast  . Carcinoma of breast metastatic to axillary lymph node,  left (HCC) 01/23/2017  . Cellulitis of right leg   . Edema, lower extremity   . GERD (gastroesophageal reflux disease)   . Healthcare maintenance 03/25/2017  . Herpesviral infection   . Hot flashes 03/2017  . Hyperlipidemia 03/20/2016  . Impaired fasting glucose   . Morbid obesity with body mass index (BMI) of 40.0 to 49.9 (HCC) 09/17/2017  . Neoplasm of cervix   . Osteoarthritis of knee   . Other chest pain   . Other specified disorders of veins   . Polyneuropathy   . Postmenopausal atrophic vaginitis   . Pre-diabetes 07/26/2018   Hgb A1C elevated on 07/26/2018, Gestational Diabetes 2012  . Pure hypercholesterolemia 03/30/2023  . Seasonal allergies 2012   seasonal allergies causes allergic rhinitis and itchy,  dry eyes per pt  . Stress incontinence   . Tachycardia   . Thyrotoxicosis   . Vitamin D deficiency 05/2015    Family History  Problem Relation Age of Onset  . Other Mother        d.56 from complications of surgery to remove brain tumor  . Diabetes Mother   . Hypertension Mother   . Stroke Mother   . Kidney disease Father   . Hypertension Maternal Grandmother   . Breast cancer Paternal Grandmother 60       d.60s from breast cancer. Did not have treatment.  . Hypertension Other   . Diabetes Other   . Stroke Other     Past Surgical History:  Procedure Laterality Date  . BREAST RECONSTRUCTION WITH PLACEMENT OF TISSUE EXPANDER AND FLEX HD (ACELLULAR HYDRATED DERMIS) Left 08/27/2017   Procedure: LEFT BREAST RECONSTRUCTION WITH PLACEMENT OF TISSUE EXPANDER AND FLEX HD;  Surgeon: Peggye Form, DO;  [facility]: [facility];  Service: Plastics;  Laterality: Left;  . CESAREAN SECTION     x2  . KNEE ARTHROSCOPY WITH MEDIAL MENISECTOMY Right 11/12/2022   Procedure: RIGHT KNEE ARTHROSCOPY, PARTIAL MEDIAL MENISCECTOMY;  Surgeon: Tarry Kos, MD;  Location: [facility];  Service: Orthopedics;  Laterality: Right;  . LATISSIMUS FLAP TO BREAST Left 11/03/2018  .  LATISSIMUS FLAP TO BREAST Left 11/03/2018   Procedure: LATISSIMUS FLAP TO LEFT BREAST;  Surgeon: Peggye Form, DO;  Location: [facility];  Service: Plastics;  Laterality: Left;  Marland Kitchen MASTECTOMY MODIFIED RADICAL Left 08/27/2017  . MASTECTOMY MODIFIED RADICAL Left 08/27/2017   Procedure: LEFT MODIFIED RADICAL MASTECTOMY;  Surgeon: Harriette Bouillon, MD;  Location: [facility];  Service: General;  Laterality: Left;  Marland Kitchen MASTOPEXY Right 03/03/2019   Procedure: RIGHT BREAST MASTOPEXY/REDUCTION;  Surgeon: Peggye Form, DO;  Location: [facility];  Service: Plastics;  Laterality: Right;  3 hours, please  . PORT-A-CATH REMOVAL Right 08/27/2017   Procedure: REMOVAL PORT-A-CATH RIGHT CHEST;  Surgeon: Harriette Bouillon, MD;  [facility]: [facility];  Service: General;  Laterality: Right;  . PORTACATH PLACEMENT Right 01/28/2017   Procedure: INSERTION PORT-A-CATH WITH ULTRASOUND;  Surgeon: Harriette Bouillon, MD;  Location: [facility];  Service: General;  Laterality: Right;  . REDUCTION MAMMAPLASTY Right   . REMOVAL OF TISSUE EXPANDER AND PLACEMENT OF IMPLANT Left 03/03/2019   Procedure: REMOVAL OF TISSUE EXPANDER AND PLACEMENT OF EXPANDER;  Surgeon: Peggye Form, DO;  Location: [facility];  Service: Plastics;  Laterality: Left;  . REMOVAL OF TISSUE EXPANDER AND PLACEMENT OF IMPLANT Left 05/25/2019   Procedure: LEFT BREAST REMOVAL OF TISSUE EXPANDER AND PLACEMENT OF IMPLANT;  Surgeon: Peggye Form, DO;  Location:  SURGERY CENTER;  Service: Plastics;  Laterality: Left;  . TISSUE EXPANDER PLACEMENT Left 11/03/2018   Procedure: PLACEMENT OF TISSUE EXPANDER LEFT BREAST;  Surgeon: Peggye Form, DO;  [facility];  Service: Plastics;  Laterality: Left;  Total case time is 3.5 hours  . TUBAL LIGATION Bilateral 01/21/2011   Social History   Occupational History  . Occupation: Charity fundraiser  Tobacco Use  . Smoking status: Never  . Smokeless tobacco: Never  Vaping  Use  . Vaping status: Never Used  Substance and Sexual Activity  . Alcohol use: Yes    Comment: social  . Drug use: No  . Sexual activity: Not on file    Comment: BTL

## 2023-08-17 NOTE — Therapy (Signed)
 OUTPATIENT OCCUPATIONAL THERAPY TREATMENT & DISCHARGE NOTE   Patient Name: Mackenzie [name] MRN: [id number] DOB:[birth year], 53 y.o., female Today's Date: 08/18/2023  PCP: Nadyne Coombes MD REFERRING PROVIDER: Cristie Hem, PA-C          OCCUPATIONAL THERAPY DISCHARGE SUMMARY  Visits from Start of Care: 3  Current functional level related to goals / functional outcomes: 08/18/23: She has now met all long-term goals except for wrist extension which we talked about her continuing her home exercise program for.  She should continue to maintain these things or else paresthesia could return.  She felt comfortable with her home exercise program and agrees to discharge successfully today.  Education / Equipment: Pt has all needed materials and education. Pt understands how to continue on with self-management. See tx notes for more details.   Patient agrees to discharge due to max benefits received from outpatient occupational therapy / hand therapy at this time.   Fannie Knee, OTR/L, CHT 08/18/23          END OF SESSION:  OT End of Session - 08/18/23 0852     Visit Number 3    Number of Visits 4    Date for OT Re-Evaluation 08/21/23    Authorization Type [facility] [employer]    OT Start Time [phone]    OT Stop Time 0918    OT Time Calculation (min) 26 min    Activity Tolerance Patient tolerated treatment well;No increased pain    Behavior During Therapy [facility] for tasks assessed/performed               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, left (HCC) 01/23/2017   Cellulitis of right leg    Edema, lower extremity    GERD (gastroesophageal reflux disease)    Healthcare maintenance  03/25/2017   Herpesviral infection    Hot flashes 03/2017   Hyperlipidemia 03/20/2016   Impaired fasting glucose    Morbid obesity with body mass index (BMI) of 40.0 to 49.9 (HCC) 09/17/2017   Neoplasm of cervix    Osteoarthritis of knee    Other chest pain    Other specified disorders of veins    Polyneuropathy    Postmenopausal atrophic vaginitis    Pre-diabetes 07/26/2018   Hgb A1C elevated on 07/26/2018, Gestational Diabetes 2012   Pure hypercholesterolemia 03/30/2023   Seasonal allergies 2012   seasonal allergies causes allergic rhinitis and itchy, dry eyes per pt   Stress incontinence    Tachycardia    Thyrotoxicosis    Vitamin D deficiency 05/2015   Past Surgical History:  Procedure Laterality Date   BREAST RECONSTRUCTION WITH PLACEMENT OF TISSUE EXPANDER AND FLEX HD (ACELLULAR HYDRATED DERMIS) Left 08/27/2017   Procedure: LEFT BREAST RECONSTRUCTION WITH PLACEMENT OF TISSUE EXPANDER AND FLEX HD;  Surgeon: Peggye Form, DO;  [facility];  Service: Plastics;  Laterality: Left;   CESAREAN SECTION     x2   KNEE ARTHROSCOPY WITH MEDIAL MENISECTOMY Right 11/12/2022   Procedure: RIGHT KNEE ARTHROSCOPY, PARTIAL MEDIAL MENISCECTOMY;  Surgeon: Tarry Kos, MD;  Location: [facility];  Service: Orthopedics;  Laterality: Right;   LATISSIMUS FLAP TO BREAST  Left 11/03/2018   LATISSIMUS FLAP TO BREAST Left 11/03/2018   Procedure: LATISSIMUS FLAP TO LEFT BREAST;  Surgeon: Peggye Form, DO;  [facility]: [facility];  Service: Plastics;  Laterality: Left;   MASTECTOMY MODIFIED RADICAL Left 08/27/2017   MASTECTOMY MODIFIED RADICAL Left 08/27/2017   Procedure: LEFT MODIFIED RADICAL MASTECTOMY;  Surgeon: Harriette Bouillon, MD;  Location: [facility];  Service: General;  Laterality: Left;   MASTOPEXY Right 03/03/2019   Procedure: RIGHT BREAST MASTOPEXY/REDUCTION;  Surgeon: Peggye Form, DO;  Location: [facility];  Service: Plastics;  Laterality: Right;  3  hours, please   PORT-A-CATH REMOVAL Right 08/27/2017   Procedure: REMOVAL PORT-A-CATH RIGHT CHEST;  Surgeon: Harriette Bouillon, MD;  [facility];  Service: General;  Laterality: Right;   PORTACATH PLACEMENT Right 01/28/2017   Procedure: INSERTION PORT-A-CATH WITH ULTRASOUND;  Surgeon: Harriette Bouillon, MD;  Location: [facility];  Service: General;  Laterality: Right;   REDUCTION MAMMAPLASTY Right    REMOVAL OF TISSUE EXPANDER AND PLACEMENT OF IMPLANT Left 03/03/2019   Procedure: REMOVAL OF TISSUE EXPANDER AND PLACEMENT OF EXPANDER;  Surgeon: Peggye Form, DO;  Location: [facility];  Service: Plastics;  Laterality: Left;   REMOVAL OF TISSUE EXPANDER AND PLACEMENT OF IMPLANT Left 05/25/2019   Procedure: LEFT BREAST REMOVAL OF TISSUE EXPANDER AND PLACEMENT OF IMPLANT;  Surgeon: Peggye Form, DO;  Location: [facility];  Service: Plastics;  Laterality: Left;   TISSUE EXPANDER PLACEMENT Left 11/03/2018   Procedure: PLACEMENT OF TISSUE EXPANDER LEFT BREAST;  Surgeon: Peggye Form, DO;  [facility];  Service: Plastics;  Laterality: Left;  Total case time is 3.5 hours   TUBAL LIGATION Bilateral 01/21/2011   Patient Active Problem List   Diagnosis Date Noted   Graves disease 07/16/2023   Multinodular goiter 07/16/2023   Right carpal tunnel syndrome 07/10/2023   Left carpal tunnel syndrome 07/10/2023   Pain of Right Knee 07/06/2023   History of breast reconstruction 05/15/2023   Pure hypercholesterolemia 03/30/2023   Acute meniscal tear, medial, right, initial encounter 11/12/2022   Plica syndrome of right knee 11/12/2022   Chondromalacia, right knee 11/12/2022   Depression 08/13/2022   BMI 40.0-44.9, adult (HCC) 08/13/2022   Obesity, Beginning BMI 08/13/2022   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   Boil of buttock, Left 12/24/2021   Hyperthyroidism 07/22/2021   Low serum thyroid stimulating hormone (TSH) 06/12/2021    Other fatigue 05/23/2021   Hemoglobin low 05/23/2021   Polyphagia 05/16/2020   Vitamin D deficiency 05/16/2020   At risk for osteoporosis 05/16/2020   Prediabetes 05/01/2020   Elevated blood pressure reading 05/01/2020   At risk for impaired metabolic function 05/01/2020   Breast asymmetry following reconstructive surgery 09/20/2019   Essential hypertension 08/17/2018   Class 3 severe obesity with serious comorbidity and body mass index (BMI) of 40.0 to 44.9 in adult [facility]) 09/17/2017   Acquired absence of left breast 09/15/2017   Malignant neoplasm of upper-inner quadrant of left breast in female, 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   Healthcare maintenance 03/25/2017   Malignant 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, estrogen receptor positive (HCC) 01/23/2017   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   She is a Engineer, civil (consulting) on PCU.  She states numbness is worst in night/early morning. She is currently in PT for for Rt knee and Achilles pain.   PRECAUTIONS: None   RED FLAGS: None   WEIGHT BEARING RESTRICTIONS: No   SUBJECTIVE:   SUBJECTIVE STATEMENT: She shows up 10 min late today. She states still having mild palmar area paresthesia, but admits to not doing her home exercise program as prescribed.  She does note that when she does it it helps.  She does state less problems with functional activities    PAIN:   Are you having pain?  No significant pain but mild paresthesia in the left palmar area  FALLS: Has patient fallen in last 6 months? No  PLOF: Independent  PATIENT  GOALS: To improve paresthesia in left hand and arm  NEXT MD VISIT: As needed    OBJECTIVE: (All objective assessments below are from initial evaluation on: 07/24/23 unless otherwise specified.)   HAND DOMINANCE: Right   ADLs: Overall ADLs: 08/18/23: No significant problems with ADLs now   FUNCTIONAL OUTCOME MEASURES: 08/18/23: Quick DASH 6.8% impairment today  (Higher % Score  =  More Impairment)    Eval: Quick DASH 20% impairment today  (Higher % Score  =  More Impairment)     UPPER EXTREMITY ROM     Shoulder to Wrist AROM Right eval Left eval Lt 08/03/23 Lt 08/18/23  Forearm supination 72 70 75 76  Forearm pronation  90 90  90  Wrist flexion 78 75 80 82  Wrist extension 77 73 65 66  (Blank rows = not tested)   Hand AROM Left eval  Full Fist Ability (or Gap to Distal Palmar Crease) full  Thumb Opposition  (Kapandji Scale)  10/10  (Blank rows = not tested)   UPPER EXTREMITY MMT:      MMT Right 07/24/23 Left 07/24/23 Lt  08/18/23  FPL flexion 5/5 4/5 5/5  (Blank rows = not tested)  HAND FUNCTION: 08/18/23: Lt 65#   08/03/23: Grip Rt: 70#, Lt: 60#   Eval: Not overly weak in Lt hand  Grip strength Right: 52 lbs, Left: 56 lbs  Tip pinch Rt: 11#; Lt: 9#  COORDINATION: 08/18/23:  9 Hole Peg Test  Left: 22sec (26 sec is WFL)   Eval: She states occasionally dropping objects with her left hand and some fine motor skill coordination issues.  Details will be tested specifically next session as able   SENSATION: 08/03/23:   S2PDT:  Lt IF: 4-79mm, thumb: 3-6mm, SF: 5mm;  Rt IF: 4.60mm, SF: 5mm  Eval:  Light touch intact today, though diminished in left hand median nerve distribution distally compared to right hand   TODAY'S TREATMENT:  08/18/23: Pt performs AROM, gripping, coordination and strength with Lt hand/arm against therapist's resistance for exercise/activities as well as new measures today. OT also discusses home and functional tasks with the pt and reviews  goals. Using the complied data, OT also reviews home exercises and provides final recommendations as below. Pt states understanding and tolerates all well, agrees to successful d/c today.    Exercises reviewed/performed today  - Median Nerve Flossing  - 2-3 x daily - 5-10 reps - Fridge Door Stretch  - 4 x daily - 3-5 reps - 15 sec hold - Forearm Supination Stretch  - 3-4 x daily - 3-5 reps - 15 sec  hold - Wrist Prayer Stretch  - 4 x daily - 3-5 reps - 15 sec hold  PATIENT EDUCATION: Education details: See tx section above for details  Person educated: Patient Education method: Verbal Instruction, Teach back, Handouts  Education comprehension: States and demonstrates understanding   HOME EXERCISE PROGRAM: Access Code: [email or web address] URL: [email or web address] Date: 07/24/2023 Prepared by: Fannie Knee   GOALS: Goals reviewed with patient? Yes   SHORT TERM GOALS: (STG required if POC>30 days) Target Date: 07/31/2023  Pt will obtain protective, custom orthotic. Goal status: N/A D/C  2.  Pt will demo/state understanding of initial HEP to improve pain levels and prerequisite motion. Goal status: 08/03/23: MET 08/03/23   LONG TERM GOALS: Target Date: 08/21/23  Pt will improve functional ability by decreased impairment per Quick DASH assessment from 20% to 10% or better, for better quality of life. Goal status: 08/18/23: MET 6% now  2.  Pt will improve A/ROM in left wrist extension from 73 degrees to at least 75 degrees, to show improvements in wrist extensibility for looseness to the carpal tunnel for better motion for tasks like reach and grasp.  Goal status: 08/18/23: NOT MET 65*, but will continue to work on it  3.  Pt will improve strength in left thumb FPL flexion from 4/5 MMT to at least 4+/5 MMT to have increased functional ability to carry out selfcare and higher-level homecare tasks with less difficulty. Goal status: 08/18/23: MET 5/5  4.  Pt will improve  coordination skills in left hand, as seen by within functional limit score on nine-hole peg testing to have increased functional ability to carry out fine motor tasks (fasteners, etc.) and more complex, coordinated IADLs (meal prep, sports, etc.).  Goal status: 08/18/23: MET WFL  5.  Pt will decrease tingling at worst to "only intermittent and mild " or better to have better occupational participation in daily roles. Goal status: 08/18/23: MET   ASSESSMENT:  CLINICAL IMPRESSION: 08/18/23: She has now met all long-term goals except for wrist extension which we talked about her continuing her home exercise program for.  She should continue to maintain these things or else paresthesia could return.  She felt comfortable with her home exercise program and agrees to discharge successfully today.    PLAN:  OT FREQUENCY: D/C  OT DURATION: D/C  PLANNED INTERVENTIONS: 97168 OT Re-evaluation, 97535 self care/ADL training, 40981 therapeutic exercise, 97530 therapeutic activity, 97112 neuromuscular re-education, 97140 manual therapy, 97035 ultrasound, 97010 moist heat, 97010 cryotherapy, 97760 Orthotics management and training, 19147 Splinting (initial encounter), [id number] Subsequent splinting/medication, Dry needling, coping strategies training, and patient/family education  CONSULTED AND AGREED WITH PLAN OF CARE: Patient  PLAN FOR NEXT SESSION:   D/C   Fannie Knee, OTR/L, CHT 08/18/2023, 9:28 AM

## 2023-08-18 ENCOUNTER — Ambulatory Visit: Admitting: Physician Assistant

## 2023-08-18 ENCOUNTER — Ambulatory Visit: Payer: 59 | Admitting: Rehabilitative and Restorative Service Providers"

## 2023-08-18 ENCOUNTER — Encounter: Payer: 59 | Admitting: Physical Therapy

## 2023-08-18 DIAGNOSIS — R202 Paresthesia of skin: Secondary | ICD-10-CM

## 2023-08-18 DIAGNOSIS — M6281 Muscle weakness (generalized): Secondary | ICD-10-CM | POA: Diagnosis not present

## 2023-08-19 ENCOUNTER — Ambulatory Visit: Admitting: Physical Therapy

## 2023-08-19 ENCOUNTER — Encounter: Payer: Self-pay | Admitting: Physical Therapy

## 2023-08-19 DIAGNOSIS — R2689 Other abnormalities of gait and mobility: Secondary | ICD-10-CM

## 2023-08-19 DIAGNOSIS — M25561 Pain in right knee: Secondary | ICD-10-CM | POA: Diagnosis not present

## 2023-08-19 DIAGNOSIS — G8929 Other chronic pain: Secondary | ICD-10-CM

## 2023-08-19 DIAGNOSIS — M6281 Muscle weakness (generalized): Secondary | ICD-10-CM

## 2023-08-19 DIAGNOSIS — R202 Paresthesia of skin: Secondary | ICD-10-CM | POA: Diagnosis not present

## 2023-08-19 DIAGNOSIS — M25571 Pain in right ankle and joints of right foot: Secondary | ICD-10-CM | POA: Diagnosis not present

## 2023-08-19 NOTE — Therapy (Signed)
 OUTPATIENT PHYSICAL THERAPY  TREATMENT    Patient Name: Mackenzie [name] MRN: [id number] DOB:1971/03/29, 53 y.o., female Today's Date: 08/19/2023   END OF SESSION:  PT End of Session - 08/19/23 1344     Visit Number 9    Number of Visits 13    Date for PT Re-Evaluation 09/04/23    Authorization Type [facility] [employer]    Authorization Time Period applied for 6 more week on 05/20/24    Progress Note Due on Visit 20    PT Start Time 1340    PT Stop Time 1420    PT Time Calculation (min) 40 min    Activity Tolerance Patient tolerated treatment well    Behavior During Therapy [facility] for tasks assessed/performed                     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, left (HCC) 01/23/2017   Cellulitis of right leg    Edema, lower extremity    GERD (gastroesophageal reflux disease)    Healthcare maintenance 03/25/2017   Herpesviral infection    Hot flashes 03/2017   Hyperlipidemia 03/20/2016   Impaired fasting glucose    Morbid obesity with body mass index (BMI) of 40.0 to 49.9 (HCC) 09/17/2017   Neoplasm of cervix    Osteoarthritis of knee    Other chest pain    Other specified disorders of veins    Polyneuropathy    Postmenopausal atrophic vaginitis    Pre-diabetes 07/26/2018   Hgb A1C elevated on 07/26/2018, Gestational Diabetes 2012   Pure hypercholesterolemia 03/30/2023   Seasonal allergies 2012   seasonal allergies causes allergic rhinitis and itchy, dry eyes per pt   Stress incontinence    Tachycardia    Thyrotoxicosis    Vitamin D deficiency 05/2015   Past Surgical History:  Procedure Laterality Date   BREAST RECONSTRUCTION WITH PLACEMENT OF TISSUE EXPANDER AND FLEX HD  (ACELLULAR HYDRATED DERMIS) Left 08/27/2017   Procedure: LEFT BREAST RECONSTRUCTION WITH PLACEMENT OF TISSUE EXPANDER AND FLEX HD;  Surgeon: Peggye Form, DO;  Location: [facility];  Service: Plastics;  Laterality: Left;   CESAREAN SECTION     x2   KNEE ARTHROSCOPY WITH MEDIAL MENISECTOMY Right 11/12/2022   Procedure: RIGHT KNEE ARTHROSCOPY, PARTIAL MEDIAL MENISCECTOMY;  Surgeon: Tarry Kos, MD;  Location: [facility];  Service: Orthopedics;  Laterality: Right;   LATISSIMUS FLAP TO BREAST Left 11/03/2018   LATISSIMUS FLAP TO BREAST Left 11/03/2018   Procedure: LATISSIMUS FLAP TO LEFT BREAST;  Surgeon: Peggye Form, DO;  Location: [facility];  Service: Plastics;  Laterality: Left;   MASTECTOMY MODIFIED RADICAL Left 08/27/2017   MASTECTOMY MODIFIED RADICAL Left 08/27/2017   Procedure: LEFT MODIFIED RADICAL MASTECTOMY;  Surgeon: Harriette Bouillon, MD;  Location: [facility];  Service: General;  Laterality: Left;   MASTOPEXY Right 03/03/2019   Procedure: RIGHT BREAST MASTOPEXY/REDUCTION;  Surgeon: Peggye Form, DO;  Location: [facility];  Service: Plastics;  Laterality: Right;  3 hours, please   PORT-A-CATH REMOVAL Right 08/27/2017   Procedure: REMOVAL PORT-A-CATH RIGHT CHEST;  Surgeon: Harriette Bouillon, MD;  Location: [facility];  Service: General;  Laterality: Right;   PORTACATH PLACEMENT Right 01/28/2017   Procedure: INSERTION PORT-A-CATH WITH ULTRASOUND;  Surgeon: Harriette Bouillon, MD;  Location: [facility];  Service: General;  Laterality: Right;   REDUCTION MAMMAPLASTY Right    REMOVAL OF TISSUE EXPANDER AND PLACEMENT OF IMPLANT Left 03/03/2019   Procedure: REMOVAL OF TISSUE EXPANDER AND PLACEMENT OF EXPANDER;  Surgeon: Peggye Form, DO;  Location: [facility];  Service: Plastics;  Laterality: Left;   REMOVAL OF TISSUE EXPANDER AND PLACEMENT OF IMPLANT Left 05/25/2019   Procedure: LEFT BREAST REMOVAL OF TISSUE EXPANDER AND PLACEMENT OF  IMPLANT;  Surgeon: Peggye Form, DO;  Location: [facility];  Service: Plastics;  Laterality: Left;   TISSUE EXPANDER PLACEMENT Left 11/03/2018   Procedure: PLACEMENT OF TISSUE EXPANDER LEFT BREAST;  Surgeon: Peggye Form, DO;  Location: [facility];  Service: Plastics;  Laterality: Left;  Total case time is 3.5 hours   TUBAL LIGATION Bilateral 01/21/2011   Patient Active Problem List   Diagnosis Date Noted   Graves disease 07/16/2023   Multinodular goiter 07/16/2023   Right carpal tunnel syndrome 07/10/2023   Left carpal tunnel syndrome 07/10/2023   Pain of Right Knee 07/06/2023   History of breast reconstruction 05/15/2023   Pure hypercholesterolemia 03/30/2023   Acute meniscal tear, medial, right, initial encounter 11/12/2022   Plica syndrome of right knee 11/12/2022   Chondromalacia, right knee 11/12/2022   Depression 08/13/2022   BMI 40.0-44.9, adult (HCC) 08/13/2022   Obesity, Beginning BMI 08/13/2022   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   Boil of buttock, Left 12/24/2021   Hyperthyroidism 07/22/2021   Low serum thyroid stimulating hormone (TSH) 06/12/2021   Other fatigue 05/23/2021   Hemoglobin low 05/23/2021   Polyphagia 05/16/2020   Vitamin D deficiency 05/16/2020   At risk for osteoporosis 05/16/2020   Prediabetes 05/01/2020   Elevated blood pressure reading 05/01/2020   At risk for impaired metabolic function 05/01/2020   Breast asymmetry following reconstructive surgery 09/20/2019   Essential hypertension 08/17/2018   Class 3 severe obesity with serious comorbidity and body mass index (BMI) of 40.0 to 44.9 in adult [facility]) 09/17/2017   Acquired absence of left breast 09/15/2017   Malignant neoplasm of upper-inner quadrant of left breast in female, 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   Healthcare maintenance 03/25/2017   Malignant 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, estrogen receptor positive (HCC)  01/23/2017   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, left (HCC) 01/23/2017    PCP: Dois Davenport, MD  REFERRING PROVIDER: Tarry Kos, MD  REFERRING DIAG: M17.11 (ICD-10-CM) - Unilateral primary osteoarthritis, right knee   THERAPY DIAG:  Muscle weakness (generalized)  Paresthesia of skin  Chronic pain of right knee  Pain in right ankle and joints of right foot  Other abnormalities of gait and mobility  Rationale for Evaluation and Treatment: Rehabilitation  ONSET DATE: 05/26/2023 referral to PT  SUBJECTIVE:   SUBJECTIVE STATEMENT: Doing okay overall; Lt calf pain still mild  PERTINENT HISTORY: Right knee arthroscopy 11/12/22, History of Lt breast cancer, GERD, hyperlipidemia,   PAIN:  NPRS scale: 4/10 at most for achilles.  Current 4/10 Pain location: Achilles.  Pain description: knee at times sharp / throbbing, general dull ache;  Achilles is pulling, sharp stretch Aggravating factors: walking esp stairs, getting up when stiff,  Relieving factors: meds, elevate, rest  PRECAUTIONS: NO BP LUE  WEIGHT BEARING RESTRICTIONS: No  FALLS:  Has  patient fallen in last 6 months? No  LIVING ENVIRONMENT: Lives with: lives with their daughter 12yo Lives in: House Stairs: Yes: External: 3 steps; none;  sunken den single step no rail.   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   Next MD visit:  not scheduled  OBJECTIVE:  DIAGNOSTIC FINDINGS: 04/21/2023 right ankle X-ray showed Retrocalcaneal osteophyte formation  PATIENT SURVEYS:  07/22/23: update FOTO 61%  07/20/2023:  update FOTO 59%  Eval: FOTO intake:  52%  predicted:  64%  COGNITION: Eval:  Overall cognitive status: WFL    SENSATION: Eval: WFL  PALPATION: 08/10/2023:  Positive [facility] testing for Rt mid point achilles tendon.   Eval Right knee tenderness to palpation medial joint line and quadriceps tendon.  Denies tenderness to Quad muscle, Patella Tendon or other (not medial) areas of joint line.  Right ankle tenderness to palpation Achilles Tendon. Denies tenderness to Gastroc belly, Plantar Fascia or with Great Toe extension.   LOWER EXTREMITY ROM:   ROM Right eval Right 06/29/23 Rt 07/07/23 supine Rt Supine Active Right 08/10/2023  Knee flexion Supine A: 105* P: 112* with pain medial knee / distal quad Supine P: 113* A: 120 P; 122 A: 122   Knee extension Supine A: quad set -5* with pain in proximal gastroc Supine P: 0* A: 0 A:0   Ankle dorsiflexion Supine with Knee extended  A: +9* P: +10* Knee pain/none Achielles Supine with knee flexed A: 16* P: 20* Standing DF with knee ext P: +19* AA: 10 A:10 10 AROM in seated 90 deg knee flexion  4 AROM in long sitting 0 deg knee ROM  Ankle plantarflexion Supine A: 21* P: 27* Knee pain/none Achielles    45 AROM in seated 90 deg knee flexion   (Blank rows = not tested)  LOWER EXTREMITY MMT:  MMT Right eval Left eval Rt 07/22/23 Left 07/22/23  Knee flexion 4/5 4+ 4+ 5  Knee extension 4/5 4+ 4+ 4+  Ankle dorsiflexion      Ankle plantarflexion 3-/5 3+ (tested standing with calf raise)     (Blank rows = not tested)  FUNCTIONAL TESTS:  07/07/23:  Rt SLS: 1st attempt 13.2 seconds, 2nd attempt: 23.7, 3rd attempt: 33.9 seconds Left SLS: 1st attempt 30.1 seconds 5 times sit to stand: 11.6 sec  Eval: 18 inch chair transfer: able to arise without UE assist but weight shifts to left 5 times sit to stand: 16.53 sec Lt SLS: 1st attempt 3.96 sec 2nd attempt 11.84 sec Rt SLS: 1st attempt 7.00 sec 2nd attempt 12.89 sec  GAIT: 08/10/2023: Independent ambulation.   Eval:  Distance walked: 100'  Assistive device utilized: Single point cane Level of assistance: SBA Comments: antalgic with decreased stance  duration RLE, right knee flexed in stance, early heel rise in terminal stance with little to no push-off noted.                      TODAY'S TREATMENT DATE:  08/19/23 TherEx SciFit bike L4 x 10 min Incline board stretch 3x30 sec hold Calf raises bil concentric; single limb eccentric 2x10 bil Knee extension machine single limb 10# 3x10 bil  TherAct Forward step ups onto 4" foam x10 bil; no UE support needed Tandem stand 3x30 sec bil on foam  08/10/2023:  Therex: Recumbent bike lvl 4 10 mins  , seat 6  Double leg up, Rt leg lowering slowly 3x 10 PF eccentric loading  Incline gastroc stretch 30 sec x 5, soleus stretch 30 sec x 5 with bent knee  Education given about eccentric loading research for achilles tendon irritations.  Emphasis on 2x/day routine use.  Additional time spent in review of Achilles specific intervention to clean up and simplify home progress to match research driven intervention.  Question and answer time and review.   Neuro Re-ed Tandem stance on airex foam without shoes in // bars with occasional HHA 1 min x 2 bilateral Anterior /posterior ankle strategy weight shift "church pew" exercise on airex foam in // bars with occasional HHA on bar 30x each way   Manual Posterior glides G4 Rt talocrural joint for DF mobility gains.    07/22/2023:  Therex: Recumbent bike, seat 6,  lvl 4, x 9 mins  Calf raise on leg press machine double leg up, Rt leg lowering slowly 2 x 10 37# for eccentric loading Incline gastroc stretch 30 sec x 3  All MMT and ROM updated this visit see charts above  TherActivity (improve stairs, ambulation, tranfers and other functional movement) Leg press double leg 106# 2 x 10, single leg 62 lbs x 15  performed bilaterally Step downs on 4 inch step x 15 bil LE's c UE support Step ups on 6 inch step x 15 bil LE's c UE support TRX squats to chair height   PATIENT EDUCATION:  Access Code: [email or web address] URL:  [email or web address] Date: 08/10/2023 Prepared by: Chyrel Masson  Exercises - Kettlebell Deadlift  - 1 x daily - 7 x weekly - 3 sets - 10 reps - Step Up  - 1 x daily - 7 x weekly - 3 sets - 10 reps - Lateral Step Down  - 1 x daily - 7 x weekly - 3 sets - 10 reps - Single Leg Squat with Counter Support  - 1 x daily - 7 x weekly - 1-2 sets - 10 reps - Single Leg Balance on Foam Pad  - 1 x daily - 7 x weekly - 1-2 sets - 10 reps - Single Leg Stance with 3-Way Kick on Foam  - 1 x daily - 7 x weekly - 1-2 sets - 10 reps - Seated Hamstring Stretch  - 1 x daily - 7 x weekly - 2-3 reps - 30 seconds hold - Modified Thomas Stretch  - 1 x daily - 7 x weekly - 1 sets - 2-3 reps - 30 seconds hold - Hooklying Hamstring Stretch with Strap  - 1 x daily - 7 x weekly - 1 sets - 2-3 reps - 30 seconds hold - Supine ITB Stretch with Strap  - 1 x daily - 7 x weekly - 1 sets - 2-3 reps - 30 seconds hold - Supine Bridge with Mini Swiss Ball Between Knees  - 1 x daily - 7 x weekly - 2 sets - 10 reps - 5 seconds hold - Clamshell with Resistance  - 1 x daily - 7 x weekly - 4 sets - 10 reps - Seated Straight Leg Raise  - 1 x daily - 7 x weekly - 3 sets - 10 reps - Squat with Chair Touch  - 1 x daily - 7 x weekly - 10 reps - standing alternating legs hip abduction on foam pad  - 1 x daily - 5 x weekly - 1 sets -  10 reps - 5 seconds hold - standing alternating legs hip extension on foam pad  - 1 x daily - 5 x weekly - 1 sets - 10 reps - 5 seconds hold - standing alternating legs hip flexion on foam pad  - 1 x daily - 5 x weekly - 1 sets - 10 reps - 5 seconds hold - Carioca with Counter Support  - 1 x daily - 7 x weekly - 1 sets - 15 reps - Standing Eccentric Heel Raise  - 2 x daily - 7 x weekly - 3 sets - 10 reps - standing calf stretch with forefoot on small step or brick  - 2-3 x daily - 7 x weekly - 1 sets - 2-3 reps - 30 seconds hold - Standing Soleus Stretch on Foam 1/2 Roll  - 2-3 x daily - 7 x  weekly - 1 sets - 2-3 reps - 30 seconds hold - Standing Dorsiflexion Self-Mobilization on Step  - 1-2 x daily - 7 x weekly - 1 sets - 10 reps - 2-3 hold  Update above removed water exercise(Pt had on previous handout).  Achilles focused exercise at bottom of this list.  ASSESSMENT: CLINICAL IMPRESSION: Good tolerance to exercises today with cues for modifications based on pain.  Progressing well with PT.   OBJECTIVE IMPAIRMENTS: Abnormal gait, decreased activity tolerance, decreased balance, decreased endurance, decreased knowledge of condition, decreased knowledge of use of DME, decreased mobility, difficulty walking, decreased ROM, decreased strength, impaired flexibility, obesity, and pain.   ACTIVITY LIMITATIONS: carrying, lifting, bending, sitting, standing, squatting, sleeping, stairs, transfers, and locomotion level  PARTICIPATION LIMITATIONS: meal prep, cleaning, community activity, and occupation  PERSONAL FACTORS: Fitness, Past/current experiences, Time since onset of injury/illness/exacerbation, and 3+ comorbidities: see PMH  are also affecting patient's functional outcome.   REHAB POTENTIAL: Good  CLINICAL DECISION MAKING: Stable/uncomplicated  EVALUATION COMPLEXITY: Moderate   GOALS: Goals reviewed with patient? Yes  SHORT TERM GOALS: (target date for Short term goals 06/29/2023)   1.  Patient will demonstrate independent use of home exercise program to maintain progress from in clinic treatments. Baseline: See objective data Goal status: MET 06/29/2023  2. PROM right knee 0* - 110* Baseline: See objective data Goal status: MET 06/29/2023  3. PROM right ankle Dorsiflexion +15* with knee extended Baseline: See objective data Goal status: MET 06/29/2023  LONG TERM GOALS: (target dates for all long term goals  09/04/2023 )   1. Patient will demonstrate/report pain at worst less than or equal to 2/10 to facilitate minimal limitation in daily activity secondary to pain  symptoms. Baseline: See objective data Goal status: Ongoing 07/22/23 (5-6/10 in pt's knee and achilles)   2. Patient will demonstrate independent use of home exercise program to facilitate ability to maintain/progress functional gains from skilled physical therapy services. Baseline: See objective data Goal status: Ongoing 05/20/24    3. Patient will demonstrate FOTO outcome > or = 64 % to indicate reduced disability due to condition. Baseline: See objective data Goal status: On-going 07/22/23: (61%)   4.  Patient will demonstrate right knee and ankle LE MMT 5/5 throughout to faciltiate usual transfers, stairs, squatting at [facility] for daily life.  Baseline: See objective data Goal status: Ongoing 07/22/2023    5.  Patient able to negotiate stairs with single rail alternating pattern modified independent with knee pain </= 2/10. Baseline: See objective data Goal status:   Partially met  07/22/2023    PLAN:  PT FREQUENCY:  1x/week  PT DURATION: 6 weeks  PLANNED INTERVENTIONS: 97164- PT Re-evaluation, 97110-Therapeutic exercises, 97530- Therapeutic activity, 97112- Neuromuscular re-education, 97535- Self Care, [id number]- Manual therapy, [phone]- Gait training, Patient/Family education, Balance training, Stair training, Taping, Dry Needling, Joint mobilization, DME instructions, Cryotherapy, and Moist heat  PLAN FOR NEXT SESSION:  last scheduled visit, Check on achilles eccentric loading routine 2x/day from [facility].    Re-cert performed on 07/22/23   Clarita Crane, PT, DPT 08/19/23 2:26 PM

## 2023-08-20 ENCOUNTER — Ambulatory Visit (INDEPENDENT_AMBULATORY_CARE_PROVIDER_SITE_OTHER): Admitting: Adult Health

## 2023-08-20 ENCOUNTER — Encounter (INDEPENDENT_AMBULATORY_CARE_PROVIDER_SITE_OTHER): Payer: Self-pay | Admitting: Adult Health

## 2023-08-20 ENCOUNTER — Other Ambulatory Visit (HOSPITAL_COMMUNITY): Payer: Self-pay

## 2023-08-20 VITALS — BP 128/77 | HR 81 | Temp 97.9°F | Ht 65.0 in | Wt 262.0 lb

## 2023-08-20 DIAGNOSIS — E669 Obesity, unspecified: Secondary | ICD-10-CM

## 2023-08-20 DIAGNOSIS — R7303 Prediabetes: Secondary | ICD-10-CM

## 2023-08-20 DIAGNOSIS — E559 Vitamin D deficiency, unspecified: Secondary | ICD-10-CM | POA: Diagnosis not present

## 2023-08-20 DIAGNOSIS — M25562 Pain in left knee: Secondary | ICD-10-CM | POA: Diagnosis not present

## 2023-08-20 DIAGNOSIS — G8929 Other chronic pain: Secondary | ICD-10-CM | POA: Diagnosis not present

## 2023-08-20 DIAGNOSIS — E7800 Pure hypercholesterolemia, unspecified: Secondary | ICD-10-CM

## 2023-08-20 DIAGNOSIS — Z6841 Body Mass Index (BMI) 40.0 and over, adult: Secondary | ICD-10-CM

## 2023-08-20 MED ORDER — WEGOVY 0.25 MG/0.5ML ~~LOC~~ SOAJ
0.2500 mg | SUBCUTANEOUS | 0 refills | Status: DC
Start: 2023-08-20 — End: 2023-11-11
  Filled 2023-08-20 – 2023-10-23 (×2): qty 2, 28d supply, fill #0

## 2023-08-20 NOTE — Progress Notes (Signed)
 WEIGHT SUMMARY AND BIOMETRICS  Vitals Temp: 97.9 F (36.6 C) BP: 128/77 Pulse Rate: 81 SpO2: 98 %   Anthropometric Measurements Height: 5\' 5"  (1.651 m) Weight: 262 lb (118.8 kg) BMI (Calculated): 43.6 Weight at Last Visit: 261 lb Weight Lost Since Last Visit: 0 Weight Gained Since Last Visit: 1 Starting Weight: 267 lb Total Weight Loss (lbs): 5 lb (2.268 kg)   Body Composition  Body Fat %: 49.6 % Fat Mass (lbs): 130.4 lbs Muscle Mass (lbs): 125.8 lbs Total Body Water (lbs): 88 lbs Visceral Fat Rating : 16   Other Clinical Data Today's Visit #: 75 Starting Date: 08/09/19    Chief Complaint:   OBESITY Mackenzie [name] is here to discuss her progress with her obesity treatment plan.  She is on the the Category 3 Plan and states she is following her eating plan approximately 80 % of the time.  She states she is exercising Stationary Bike 20 minutes 3 times per week.  Interim History:  08/14/2023- Mackenzie [name] was walking into work [employer]) and her L knee "gave out".  She was able to catch herself with her cane and a co-worker responded immediately to assist her. She was seen same day at [facility]- reviewed notes with pt She is ambulating now with cane. She denies pain at rest. She rates pain with ambulation/standing at 3/10- described as "stiffness/tightness"  Subjective:   1. Pure hypercholesterolemia Lipid Panel     Component Value Date/Time   CHOL 197 06/08/2023 1036   TRIG 93 06/08/2023 1036   HDL 56 06/08/2023 1036   CHOLHDL 3.0 03/31/2023 1125   CHOLHDL 3.4 06/27/2020 0800   VLDL 7 06/27/2020 0800   LDLCALC 124 (H) 06/08/2023 1036   LABVLDL 17 06/08/2023 1036    Crestor recently increased from 10mg  to 20mg  She is tolerating increased statin dose  2. Chronic pain of left knee 08/14/2023 [facility] Notes- HPI: Patient is a pleasant 53 year old woman who is a patient of Dr. Roda [name].  She recently had surgery arthroscopic surgery on her right knee.   She thinks she has been overcompensating on her left she said that her knee buckled and she is complaining of pain in the back of the knee no anterior pain no fever or chills   Assessment & Plan: Visit Diagnoses:  1. Chronic pain of left knee       Plan: Exam consistent with probable ruptured Baker's cyst.  She has had cortisone injections into the left knee before.  I offered her 1 today.  She also has been authorized for viscosupplementation.  Because of her pain today I am advocating a steroid injection today she can return in 2 weeks and have a viscosupplementation with Mackenzie [name]   Follow-Up Instructions: No follow-ups on file.    Ortho Exam   Patient is alert, oriented, no adenopathy, well-dressed, normal affect, normal respiratory effort. Left knee no effusion no erythema compartments are soft and compressible she has a negative Homans' sign no calf tenderness she has more tenderness posteriorly in the popliteal fossa no real tenderness along the joint line of the patellofemoral joint no evidence of infection   Imaging: XR KNEE 3 VIEW LEFT Result Date: 08/14/2023 Radiographs of her left knee do demonstrate some very early degenerative changes although she is has still fairly preserved joint spacing little less on the medial side    No images are attached to the encounter.  Procedures: Large Joint Inj: L knee on 08/14/2023 4:04 PM Indications:  pain and diagnostic evaluation Details: 25 G 1.5 in needle, anteromedial approach   Arthrogram: No   Medications: 40 mg methylPREDNISolone acetate 40 MG/ML Outcome: tolerated well, no immediate complications Procedure, treatment alternatives, risks and benefits explained, specific risks discussed. Consent was given by the patient.    3. Pre-diabetes Lab Results  Component Value Date   HGBA1C 6.1 (H) 06/08/2023   HGBA1C 6.0 (H) 01/19/2023   HGBA1C 5.8 (H) 09/03/2022    She is on Metformin 500mg  BID with meals She has been on wegovy in  past and tolerated well. Current BMI 43.7, she needs to achieve BMI <40 for impending orthopedic procedures  4. Vitamin D deficiency She is on daily Calcium-Phosphorus-Vitamin D (VITAMIN D3/CALCIUM/PHOSPHORUS PO)   Assessment/Plan:   1. Pure hypercholesterolemia Start Wegovy Continue increased statin therapy  2. Chronic pain of left knee F/u with [facility] as directed  3. Pre-diabetes (Primary) Continue Metformin 500mg  BID and start weekly Wegovy 0.25mg  Increase protein and reduce sugar/simple CHO Remain as active as tolerated, re: L knee pain  4. Vitamin D deficiency Continue Calcium-Phosphorus-Vitamin D (VITAMIN D3/CALCIUM/PHOSPHORUS PO)   5. Obesity, Current BMI 43.7 Restart Semaglutide-Weight Management (WEGOVY) 0.25 MG/0.5ML SOAJ Inject 0.25 mg into the skin once a week. Dispense: 2 mL, Refills: 0 of 0 remaining   Mackenzie [name] is currently in the action stage of change. As such, her goal is to continue with weight loss efforts. She has agreed to the Category 3 Plan.   Exercise goals: All adults should avoid inactivity. Some physical activity is better than none, and adults who participate in any amount of physical activity gain some health benefits. Adults should also include muscle-strengthening activities that involve all major muscle groups on 2 or more days a week.  Behavioral modification strategies: increasing lean protein intake, decreasing simple carbohydrates, increasing vegetables, increasing water intake, decreasing eating out, no skipping meals, meal planning and cooking strategies, keeping healthy foods in the home, and planning for success.  Mackenzie [name] has agreed to follow-up with our clinic in 4 weeks. She was informed of the importance of frequent follow-up visits to maximize her success with intensive lifestyle modifications for her multiple health conditions.   Objective:   Blood pressure 128/77, pulse 81, temperature 97.9 F (36.6 C), height 5\' 5"  (1.651 m),  weight 262 lb (118.8 kg), last menstrual period 11/28/2016, SpO2 98%, unknown if currently breastfeeding. Body mass index is 43.6 kg/m.  General: Cooperative, alert, well developed, in no acute distress. HEENT: Conjunctivae and lids unremarkable. Cardiovascular: Regular rhythm.  Lungs: Normal work of breathing. Neurologic: No focal deficits.   Lab Results  Component Value Date   CREATININE 1.10 (H) 06/09/2023   BUN 28 (H) 06/09/2023   NA 132 (L) 06/09/2023   K 4.0 06/09/2023   CL 98 06/09/2023   CO2 28 06/09/2023   Lab Results  Component Value Date   ALT 28 06/09/2023   AST 21 06/09/2023   GGT 49 06/09/2023   ALKPHOS 132 (H) 06/09/2023   BILITOT 0.5 06/09/2023   Lab Results  Component Value Date   HGBA1C 6.1 (H) 06/08/2023   HGBA1C 6.0 (H) 01/19/2023   HGBA1C 5.8 (H) 09/03/2022   HGBA1C 5.4 01/29/2022   HGBA1C 5.4 12/19/2021   Lab Results  Component Value Date   INSULIN 24.1 06/08/2023   INSULIN 15.4 01/19/2023   INSULIN 13.1 09/03/2022   INSULIN 21.3 01/29/2022   INSULIN 18.2 01/07/2022   Lab Results  Component Value Date   TSH 1.39 07/16/2023  Lab Results  Component Value Date   CHOL 197 06/08/2023   HDL 56 06/08/2023   LDLCALC 124 (H) 06/08/2023   TRIG 93 06/08/2023   CHOLHDL 3.0 03/31/2023   Lab Results  Component Value Date   VD25OH 65.8 06/08/2023   VD25OH 76.5 01/19/2023   VD25OH 81.2 09/03/2022   Lab Results  Component Value Date   WBC 7.0 06/09/2023   HGB 13.4 06/09/2023   HCT 39.9 06/09/2023   MCV 83.6 06/09/2023   PLT 247 06/09/2023   No results found for: "IRON", "TIBC", "FERRITIN"  Attestation Statements:   Reviewed by clinician on day of visit: allergies, medications, problem list, medical history, surgical history, family history, social history, and previous encounter notes.  I have reviewed the above documentation for accuracy and completeness, and I agree with the above. -  Bobbyjo Marulanda d. Annistyn Depass, NP-C

## 2023-08-24 ENCOUNTER — Ambulatory Visit (INDEPENDENT_AMBULATORY_CARE_PROVIDER_SITE_OTHER): Payer: 59 | Admitting: Adult Health

## 2023-08-25 ENCOUNTER — Encounter: Payer: Self-pay | Admitting: Physical Therapy

## 2023-08-25 ENCOUNTER — Ambulatory Visit: Payer: 59 | Admitting: Physical Therapy

## 2023-08-25 DIAGNOSIS — R2689 Other abnormalities of gait and mobility: Secondary | ICD-10-CM

## 2023-08-25 DIAGNOSIS — G8929 Other chronic pain: Secondary | ICD-10-CM | POA: Diagnosis not present

## 2023-08-25 DIAGNOSIS — M25671 Stiffness of right ankle, not elsewhere classified: Secondary | ICD-10-CM | POA: Diagnosis not present

## 2023-08-25 DIAGNOSIS — R202 Paresthesia of skin: Secondary | ICD-10-CM

## 2023-08-25 DIAGNOSIS — M6281 Muscle weakness (generalized): Secondary | ICD-10-CM

## 2023-08-25 DIAGNOSIS — M25561 Pain in right knee: Secondary | ICD-10-CM

## 2023-08-25 DIAGNOSIS — M25571 Pain in right ankle and joints of right foot: Secondary | ICD-10-CM | POA: Diagnosis not present

## 2023-08-25 NOTE — Therapy (Addendum)
 OUTPATIENT PHYSICAL THERAPY  TREATMENT DISCHARGE SUMMARY    Patient Name: Mackenzie [name] MRN: [id number] DOB:November 16, 1970, 53 y.o., female Today's Date: 08/25/2023   END OF SESSION:  PT End of Session - 08/25/23 0940     Visit Number 10    Number of Visits 13    Date for PT Re-Evaluation 09/04/23    Authorization Type [facility] AET[employer]    Authorization Time Period applied for 6 more week on 05/20/24    Progress Note Due on Visit 20    PT Start Time 0940   pt arrived late   PT Stop Time 1012    PT Time Calculation (min) 32 min    Activity Tolerance Patient tolerated treatment well    Behavior During Therapy [facility] for tasks assessed/performed                 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, left (HCC) 01/23/2017   Cellulitis of right leg    Edema, lower extremity    GERD (gastroesophageal reflux disease)    Healthcare maintenance 03/25/2017   Herpesviral infection    Hot flashes 03/2017   Hyperlipidemia 03/20/2016   Impaired fasting glucose    Morbid obesity with body mass index (BMI) of 40.0 to 49.9 (HCC) 09/17/2017   Neoplasm of cervix    Osteoarthritis of knee    Other chest pain    Other specified disorders of veins    Polyneuropathy    Postmenopausal atrophic vaginitis    Pre-diabetes 07/26/2018   Hgb A1C elevated on 07/26/2018, Gestational Diabetes 2012   Pure hypercholesterolemia 03/30/2023   Seasonal allergies 2012   seasonal allergies causes allergic rhinitis and itchy, dry eyes per pt   Stress incontinence    Tachycardia    Thyrotoxicosis    Vitamin D  deficiency 05/2015   Past Surgical History:  Procedure Laterality Date   BREAST RECONSTRUCTION WITH PLACEMENT OF  TISSUE EXPANDER AND FLEX HD (ACELLULAR HYDRATED DERMIS) Left 08/27/2017   Procedure: LEFT BREAST RECONSTRUCTION WITH PLACEMENT OF TISSUE EXPANDER AND FLEX HD;  Surgeon: Lowery Estefana RAMAN, DO;  Location: [facility];  Service: Plastics;  Laterality: Left;   CESAREAN SECTION     x2   KNEE ARTHROSCOPY WITH MEDIAL MENISECTOMY Right 11/12/2022   Procedure: RIGHT KNEE ARTHROSCOPY, PARTIAL MEDIAL MENISCECTOMY;  Surgeon: Jerri Kay HERO, MD;  Location: [facility];  Service: Orthopedics;  Laterality: Right;   LATISSIMUS FLAP TO BREAST Left 11/03/2018   LATISSIMUS FLAP TO BREAST Left 11/03/2018   Procedure: LATISSIMUS FLAP TO LEFT BREAST;  Surgeon: Lowery Estefana RAMAN, DO;  Location: [facility];  Service: Plastics;  Laterality: Left;   MASTECTOMY MODIFIED RADICAL Left 08/27/2017   MASTECTOMY MODIFIED RADICAL Left 08/27/2017   Procedure: LEFT MODIFIED RADICAL MASTECTOMY;  Surgeon: Vanderbilt Ned, MD;  Location: [facility];  Service: General;  Laterality: Left;   MASTOPEXY Right 03/03/2019   Procedure: RIGHT BREAST MASTOPEXY/REDUCTION;  Surgeon: Lowery Estefana RAMAN, DO;  Location: [facility];  Service: Plastics;  Laterality: Right;  3 hours, please   PORT-A-CATH REMOVAL Right 08/27/2017   Procedure: REMOVAL PORT-A-CATH RIGHT CHEST;  Surgeon: Vanderbilt Ned, MD;  Location: [facility];  Service: General;  Laterality: Right;   PORTACATH PLACEMENT Right 01/28/2017   Procedure: INSERTION PORT-A-CATH WITH ULTRASOUND;  Surgeon: Vanderbilt Ned, MD;  Location: [facility];  Service: General;  Laterality: Right;   REDUCTION MAMMAPLASTY Right    REMOVAL OF TISSUE EXPANDER AND PLACEMENT OF IMPLANT Left 03/03/2019   Procedure: REMOVAL OF TISSUE EXPANDER AND PLACEMENT OF EXPANDER;  Surgeon: Lowery Estefana RAMAN, DO;  Location: [facility];  Service: Plastics;  Laterality: Left;   REMOVAL OF TISSUE EXPANDER AND PLACEMENT OF IMPLANT Left 05/25/2019   Procedure: LEFT BREAST REMOVAL OF  TISSUE EXPANDER AND PLACEMENT OF IMPLANT;  Surgeon: Lowery Estefana RAMAN, DO;  Location: [facility];  Service: Plastics;  Laterality: Left;   TISSUE EXPANDER PLACEMENT Left 11/03/2018   Procedure: PLACEMENT OF TISSUE EXPANDER LEFT BREAST;  Surgeon: Lowery Estefana RAMAN, DO;  Location: [facility];  Service: Plastics;  Laterality: Left;  Total case time is 3.5 hours   TUBAL LIGATION Bilateral 01/21/2011   Patient Active Problem List   Diagnosis Date Noted   Graves disease 07/16/2023   Multinodular goiter 07/16/2023   Right carpal tunnel syndrome 07/10/2023   Left carpal tunnel syndrome 07/10/2023   Pain of Right Knee 07/06/2023   History of breast reconstruction 05/15/2023   Pure hypercholesterolemia 03/30/2023   Acute meniscal tear, medial, right, initial encounter 11/12/2022   Plica syndrome of right knee 11/12/2022   Chondromalacia, right knee 11/12/2022   Depression 08/13/2022   BMI 40.0-44.9, adult (HCC) 08/13/2022   Obesity, Beginning BMI 08/13/2022   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   Boil of buttock, Left 12/24/2021   Hyperthyroidism 07/22/2021   Low serum thyroid  stimulating hormone (TSH) 06/12/2021   Other fatigue 05/23/2021   Hemoglobin low 05/23/2021   Polyphagia 05/16/2020   Vitamin D  deficiency 05/16/2020   At risk for osteoporosis 05/16/2020   Prediabetes 05/01/2020   Elevated blood pressure reading 05/01/2020   At risk for impaired metabolic function 05/01/2020   Breast asymmetry following reconstructive surgery 09/20/2019   Essential hypertension 08/17/2018   Class 3 severe obesity with serious comorbidity and body mass index (BMI) of 40.0 to 44.9 in adult [facility]) 09/17/2017   Acquired absence of left breast 09/15/2017   Malignant neoplasm of upper-inner quadrant of left breast in female, 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   Healthcare maintenance 03/25/2017   Malignant 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, estrogen  receptor positive (HCC) 01/23/2017   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, left (HCC) 01/23/2017    PCP: [name], MD  REFERRING PROVIDER: Jerri Kay HERO, MD  REFERRING DIAG: M17.11 (ICD-10-CM) - Unilateral primary osteoarthritis, right knee   THERAPY DIAG:  Muscle weakness (generalized)  Paresthesia of skin  Chronic pain of right knee  Pain in right ankle and joints of right foot  Other abnormalities of gait and mobility  Stiffness of right ankle, not elsewhere classified  Rationale for Evaluation and Treatment: Rehabilitation  ONSET DATE: 05/26/2023 referral to PT  SUBJECTIVE:   SUBJECTIVE STATEMENT: Lt knee and Rt ankle are bothering her today  PERTINENT HISTORY: Right knee arthroscopy 11/12/22, History of Lt breast cancer, GERD, hyperlipidemia,   PAIN:  NPRS scale: 4/10 at most for achilles.  Current up to 8/10 in Lt knee Pain location: Achilles.  Pain description: knee at times sharp / throbbing, general dull ache;  Achilles is pulling, sharp stretch Aggravating factors: walking esp stairs, getting up when stiff,  Relieving factors:  meds, elevate, rest  PRECAUTIONS: NO BP LUE  WEIGHT BEARING RESTRICTIONS: No  FALLS:  Has patient fallen in last 6 months? No  LIVING ENVIRONMENT: Lives with: lives with their daughter 12yo Lives in: House Stairs: Yes: External: 3 steps; none;  sunken den single step no rail.   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   Next MD visit:  not scheduled  OBJECTIVE:  DIAGNOSTIC FINDINGS: 04/21/2023 right ankle X-ray showed Retrocalcaneal osteophyte formation  PATIENT SURVEYS:  08/25/23: FOTO 48  07/22/23: update FOTO 61%  07/20/2023:  update FOTO 59%  Eval: FOTO intake:  52%  predicted:   64%  COGNITION: Eval:  Overall cognitive status: WFL    SENSATION: Eval: WFL  PALPATION: 08/10/2023:  Positive [facility] testing for Rt mid point achilles tendon.   Eval Right knee tenderness to palpation medial joint line and quadriceps tendon.  Denies tenderness to Quad muscle, Patella Tendon or other (not medial) areas of joint line.  Right ankle tenderness to palpation Achilles Tendon. Denies tenderness to Gastroc belly, Plantar Fascia or with Great Toe extension.   LOWER EXTREMITY ROM:   ROM Right eval Right 06/29/23 Rt 07/07/23 supine Rt Supine Active Right 08/10/2023  Knee flexion Supine A: 105* P: 112* with pain medial knee / distal quad Supine P: 113* A: 120 P; 122 A: 122   Knee extension Supine A: quad set -5* with pain in proximal gastroc Supine P: 0* A: 0 A:0   Ankle dorsiflexion Supine with Knee extended  A: +9* P: +10* Knee pain/none Achielles Supine with knee flexed A: 16* P: 20* Standing DF with knee ext P: +19* AA: 10 A:10 10 AROM in seated 90 deg knee flexion  4 AROM in long sitting 0 deg knee ROM  Ankle plantarflexion Supine A: 21* P: 27* Knee pain/none Achielles    45 AROM in seated 90 deg knee flexion   (Blank rows = not tested)  LOWER EXTREMITY MMT:  MMT Right eval Left eval Rt 07/22/23 Left 07/22/23  Knee flexion 4/5 4+ 4+ 5  Knee extension 4/5 4+ 4+ 4+  Ankle dorsiflexion      Ankle plantarflexion 3-/5 3+ (tested standing with calf raise)     (Blank rows = not tested)  FUNCTIONAL TESTS:  07/07/23:  Rt SLS: 1st attempt 13.2 seconds, 2nd attempt: 23.7, 3rd attempt: 33.9 seconds Left SLS: 1st attempt 30.1 seconds 5 times sit to stand: 11.6 sec  Eval: 18 inch chair transfer: able to arise without UE assist but weight shifts to left 5 times sit to stand: 16.53 sec Lt SLS: 1st attempt 3.96 sec 2nd attempt 11.84 sec Rt SLS: 1st attempt 7.00 sec 2nd attempt 12.89 sec  GAIT: 08/10/2023: Independent ambulation.   Eval:   Distance walked: 100'  Assistive device utilized: Single point cane Level of assistance: SBA Comments: antalgic with decreased stance duration RLE, right knee flexed in stance, early heel rise in terminal stance with little to no push-off noted.                      TODAY'S TREATMENT DATE:  08/25/23 TherEx Recumbent bike L5 x 8 min Incline board stretch 3x30 sec hold Calf raises on incline board 2x15; light UE support   TherAct Forward step ups onto 6-8  step with 1 UE support 2x10 bil (had to decrease to 6 with Lt step up)   08/19/23 TherEx SciFit bike L4 x 10 min Incline board stretch 3x30 sec hold Calf raises bil concentric; single limb eccentric 2x10 bil Knee extension machine single limb 10# 3x10 bil  TherAct Forward step ups onto 4 foam x10 bil; no UE support needed Tandem stand 3x30 sec bil on foam   08/10/2023:  Therex: Recumbent bike lvl 4 10 mins  , seat 6  Double leg up, Rt leg lowering slowly 3x 10 PF eccentric loading  Incline gastroc stretch 30 sec x 5, soleus stretch 30 sec x 5 with bent knee  Education given about eccentric loading research for achilles tendon irritations.  Emphasis on 2x/day routine use.  Additional time spent in review of Achilles specific intervention to clean up and simplify home progress to match research driven intervention.  Question and answer time and review.   Neuro Re-ed Tandem stance on airex foam without shoes in // bars with occasional HHA 1 min x 2 bilateral Anterior /posterior ankle strategy weight shift church pew exercise on airex foam in // bars with occasional HHA on bar 30x each way   Manual Posterior glides G4 Rt talocrural joint for DF mobility gains.    07/22/2023:  Therex: Recumbent bike, seat 6,  lvl 4, x 9 mins  Calf raise on leg press machine double leg up, Rt leg lowering slowly 2 x 10 37# for eccentric loading Incline gastroc stretch 30 sec x 3  All MMT and ROM updated this visit see charts  above  TherActivity (improve stairs, ambulation, tranfers and other functional movement) Leg press double leg 106# 2 x 10, single leg 62 lbs x 15  performed bilaterally Step downs on 4 inch step x 15 bil LE's c UE support Step ups on 6 inch step x 15 bil LE's c UE support TRX squats to chair height   PATIENT EDUCATION:  Access Code: [email or web address] URL: [email or web address] Date: 08/10/2023 Prepared by: Ozell Silvan  Exercises - Kettlebell Deadlift  - 1 x daily - 7 x weekly - 3 sets - 10 reps - Step Up  - 1 x daily - 7 x weekly - 3 sets - 10 reps - Lateral Step Down  - 1 x daily - 7 x weekly - 3 sets - 10 reps - Single Leg Squat with Counter Support  - 1 x daily - 7 x weekly - 1-2 sets - 10 reps - Single Leg Balance on Foam Pad  - 1 x daily - 7 x weekly - 1-2 sets - 10 reps - Single Leg Stance with 3-Way Kick on Foam  - 1 x daily - 7 x weekly - 1-2 sets - 10 reps - Seated Hamstring Stretch  - 1 x daily - 7 x weekly - 2-3 reps - 30 seconds hold - Modified Thomas Stretch  - 1 x daily - 7 x weekly - 1 sets - 2-3 reps - 30 seconds hold - Hooklying Hamstring Stretch with Strap  - 1 x daily - 7 x weekly - 1 sets - 2-3 reps - 30 seconds hold - Supine ITB Stretch with Strap  - 1 x daily - 7 x weekly - 1 sets - 2-3 reps - 30 seconds hold - Supine Bridge with Mini Swiss Ball Between Knees  - 1 x daily - 7 x weekly - 2 sets - 10 reps - 5 seconds hold - Clamshell with Resistance  -  1 x daily - 7 x weekly - 4 sets - 10 reps - Seated Straight Leg Raise  - 1 x daily - 7 x weekly - 3 sets - 10 reps - Squat with Chair Touch  - 1 x daily - 7 x weekly - 10 reps - standing alternating legs hip abduction on foam pad  - 1 x daily - 5 x weekly - 1 sets - 10 reps - 5 seconds hold - standing alternating legs hip extension on foam pad  - 1 x daily - 5 x weekly - 1 sets - 10 reps - 5 seconds hold - standing alternating legs hip flexion on foam pad  - 1 x daily - 5 x weekly - 1 sets - 10 reps - 5  seconds hold - Carioca with Counter Support  - 1 x daily - 7 x weekly - 1 sets - 15 reps - Standing Eccentric Heel Raise  - 2 x daily - 7 x weekly - 3 sets - 10 reps - standing calf stretch with forefoot on small step or brick  - 2-3 x daily - 7 x weekly - 1 sets - 2-3 reps - 30 seconds hold - Standing Soleus Stretch on Foam 1/2 Roll  - 2-3 x daily - 7 x weekly - 1 sets - 2-3 reps - 30 seconds hold - Standing Dorsiflexion Self-Mobilization on Step  - 1-2 x daily - 7 x weekly - 1 sets - 10 reps - 2-3 hold  Update above removed water exercise(Pt had on previous handout).  Achilles focused exercise at bottom of this list.  ASSESSMENT: CLINICAL IMPRESSION: Pt's FOTO score decreased today to less than eval, likely due to recent Lt knee injury limiting progress.  At this time will await until she sees Dr. Jerri to determine next steps.     OBJECTIVE IMPAIRMENTS: Abnormal gait, decreased activity tolerance, decreased balance, decreased endurance, decreased knowledge of condition, decreased knowledge of use of DME, decreased mobility, difficulty walking, decreased ROM, decreased strength, impaired flexibility, obesity, and pain.   ACTIVITY LIMITATIONS: carrying, lifting, bending, sitting, standing, squatting, sleeping, stairs, transfers, and locomotion level  PARTICIPATION LIMITATIONS: meal prep, cleaning, community activity, and occupation  PERSONAL FACTORS: Fitness, Past/current experiences, Time since onset of injury/illness/exacerbation, and 3+ comorbidities: see PMH are also affecting patient's functional outcome.   REHAB POTENTIAL: Good  CLINICAL DECISION MAKING: Stable/uncomplicated  EVALUATION COMPLEXITY: Moderate   GOALS: Goals reviewed with patient? Yes  SHORT TERM GOALS: (target date for Short term goals 06/29/2023)   1.  Patient will demonstrate independent use of home exercise program to maintain progress from in clinic treatments. Baseline: See objective data Goal status: MET  06/29/2023  2. PROM right knee 0* - 110* Baseline: See objective data Goal status: MET 06/29/2023  3. PROM right ankle Dorsiflexion +15* with knee extended Baseline: See objective data Goal status: MET 06/29/2023  LONG TERM GOALS: (target dates for all long term goals  09/04/2023 )   1. Patient will demonstrate/report pain at worst less than or equal to 2/10 to facilitate minimal limitation in daily activity secondary to pain symptoms. Baseline: See objective data Goal status: Ongoing 07/22/23 (5-6/10 in pt's knee and achilles)   2. Patient will demonstrate independent use of home exercise program to facilitate ability to maintain/progress functional gains from skilled physical therapy services. Baseline: See objective data Goal status: Ongoing 05/20/24    3. Patient will demonstrate FOTO outcome > or = 64 % to indicate reduced disability due to  condition. Baseline: See objective data Goal status: On-going 07/22/23: (61%)   4.  Patient will demonstrate right knee and ankle LE MMT 5/5 throughout to faciltiate usual transfers, stairs, squatting at [facility] for daily life.  Baseline: See objective data Goal status: Ongoing 07/22/2023    5.  Patient able to negotiate stairs with single rail alternating pattern modified independent with knee pain </= 2/10. Baseline: See objective data Goal status:   Partially met  07/22/2023    PLAN:  PT FREQUENCY:  1x/week  PT DURATION: 6 weeks  PLANNED INTERVENTIONS: [id number]- PT Re-evaluation, 97110-Therapeutic exercises, 97530- Therapeutic activity, 97112- Neuromuscular re-education, 97535- Self Care, [id number]- Manual therapy, [phone]- Gait training, Patient/Family education, Balance training, Stair training, Taping, Dry Needling, Joint mobilization, DME instructions, Cryotherapy, and Moist heat  PLAN FOR NEXT SESSION: hold until after she sees Xu    Re-cert performed on 07/22/23   Corean JULIANNA Ku, PT, DPT 08/25/23 10:14 AM   PHYSICAL THERAPY  DISCHARGE SUMMARY  Visits from Start of Care: 10  Current functional level related to goals / functional outcomes: See above   Remaining deficits: See above   Education / Equipment: HEP   Patient agrees to discharge. Patient goals were partially met. Patient is being discharged due to not returning since the last visit.  Corean JULIANNA Ku, PT, DPT 12/09/23 1:22 PM  [facility] [address] [place], [place], [address] Phone: [phone]   Fax:  [phone]

## 2023-08-26 NOTE — Progress Notes (Unsigned)
 Office Visit Note   Patient: Mackenzie [name]           Date of Birth: May 12, 1971           MRN: [id number] Visit Date: 08/28/2023              Requested by:  Dois Davenport, MD [address] [place],  [place] [address]  PCP: Dois Davenport, MD   Assessment & Plan: Visit Diagnoses: No diagnosis found.  Plan: ***  Follow-Up Instructions: No follow-ups on file.   Orders:  No orders of the defined types were placed in this encounter. No orders of the defined types were placed in this encounter.    Procedures: No procedures performed   Clinical Data: No additional findings.   Subjective: No chief complaint on file.  HPI  Review of Systems   Objective: Vital Signs: LMP 11/28/2016   Physical Exam  Ortho Exam  Specialty Comments:  No specialty comments available.  Imaging: No results found.   PMFS History: Patient Active Problem List   Diagnosis Date Noted  . Graves disease 07/16/2023  . Multinodular goiter 07/16/2023  . Right carpal tunnel syndrome 07/10/2023  . Left carpal tunnel syndrome 07/10/2023  . Pain of Right Knee 07/06/2023  . History of breast reconstruction 05/15/2023  . Pure hypercholesterolemia 03/30/2023  . Acute meniscal tear, medial, right, initial encounter 11/12/2022  . Plica syndrome of right knee 11/12/2022  . Chondromalacia, right knee 11/12/2022  . Depression 08/13/2022  . BMI 40.0-44.9, adult (HCC) 08/13/2022  . Obesity, Beginning BMI 08/13/2022  . 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  . Boil of buttock, Left 12/24/2021  . Hyperthyroidism 07/22/2021  . Low serum thyroid stimulating hormone (TSH) 06/12/2021  . Other fatigue 05/23/2021  . Hemoglobin low 05/23/2021  . Polyphagia 05/16/2020  . Vitamin D deficiency 05/16/2020  . At risk for osteoporosis 05/16/2020  . Prediabetes 05/01/2020  . Elevated blood pressure reading 05/01/2020  . At risk for impaired metabolic function 05/01/2020  .  Breast asymmetry following reconstructive surgery 09/20/2019  . Essential hypertension 08/17/2018  . Class 3 severe obesity with serious comorbidity and body mass index (BMI) of 40.0 to 44.9 in adult (HCC) 09/17/2017  . 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 Malignant neoplasm of upper-inner quadrant of left breast in female, 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  . Healthcare maintenance 03/25/2017  . Malignant 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, estrogen receptor positive (HCC) 01/23/2017  . 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 Abnormal glucose 2018  . 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 Allergic rhinitis 2012  . Anemia 01/27/2017   prior to starting chemotherapy  . BMI 40.0-44.9, adult (HCC)   . Breast cancer (HCC) 01/14/2017   Left breast  . 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 Cellulitis of right leg   . Edema, lower extremity   . GERD (gastroesophageal reflux disease)   . Healthcare maintenance 03/25/2017  . Herpesviral infection   . Hot flashes 03/2017  . Hyperlipidemia 03/20/2016  . Impaired fasting glucose   . Morbid obesity with body mass index (BMI) of 40.0 to 49.9 (HCC) 09/17/2017  . Neoplasm of cervix   . Osteoarthritis of knee   . Other chest pain   . Other specified disorders of veins   . Polyneuropathy   .  Postmenopausal atrophic vaginitis   . Pre-diabetes 07/26/2018   Hgb A1C elevated on 07/26/2018, Gestational Diabetes 2012  . Pure hypercholesterolemia 03/30/2023  . Seasonal allergies 2012   seasonal allergies causes allergic rhinitis and itchy, dry eyes per pt  . Stress incontinence   . Tachycardia   . Thyrotoxicosis   . Vitamin D deficiency 05/2015    Family History  Problem Relation Age of Onset  . Other Mother        d.56 from complications of surgery to remove brain tumor  .  Diabetes Mother   . Hypertension Mother   . Stroke Mother   . Kidney disease Father   . Hypertension Maternal Grandmother   . Breast cancer Paternal Grandmother 60       d.60s from breast cancer. Did not have treatment.  . Hypertension Other   . Diabetes Other   . Stroke Other     Past Surgical History:  Procedure Laterality Date  . BREAST RECONSTRUCTION WITH PLACEMENT OF TISSUE EXPANDER AND FLEX HD (ACELLULAR HYDRATED DERMIS) Left 08/27/2017   Procedure: LEFT BREAST RECONSTRUCTION WITH PLACEMENT OF TISSUE EXPANDER AND FLEX HD;  Surgeon: Peggye Form, DO;  [facility]: [facility];  Service: Plastics;  Laterality: Left;  . CESAREAN SECTION     x2  . KNEE ARTHROSCOPY WITH MEDIAL MENISECTOMY Right 11/12/2022   Procedure: RIGHT KNEE ARTHROSCOPY, PARTIAL MEDIAL MENISCECTOMY;  Surgeon: Tarry Kos, MD;  Location: [facility];  Service: Orthopedics;  Laterality: Right;  . LATISSIMUS FLAP TO BREAST Left 11/03/2018  . LATISSIMUS FLAP TO BREAST Left 11/03/2018   Procedure: LATISSIMUS FLAP TO LEFT BREAST;  Surgeon: Peggye Form, DO;  [facility]: [facility];  Service: Plastics;  Laterality: Left;  Marland Kitchen MASTECTOMY MODIFIED RADICAL Left 08/27/2017  . MASTECTOMY MODIFIED RADICAL Left 08/27/2017   Procedure: LEFT MODIFIED RADICAL MASTECTOMY;  Surgeon: Harriette Bouillon, MD;  Location: [facility];  Service: General;  Laterality: Left;  Marland Kitchen MASTOPEXY Right 03/03/2019   Procedure: RIGHT BREAST MASTOPEXY/REDUCTION;  Surgeon: Peggye Form, DO;  Location: [facility];  Service: Plastics;  Laterality: Right;  3 hours, please  . PORT-A-CATH REMOVAL Right 08/27/2017   Procedure: REMOVAL PORT-A-CATH RIGHT CHEST;  Surgeon: Harriette Bouillon, MD;  [facility];  Service: General;  Laterality: Right;  . PORTACATH PLACEMENT Right 01/28/2017   Procedure: INSERTION PORT-A-CATH WITH ULTRASOUND;  Surgeon: Harriette Bouillon, MD;  Location: [facility];  Service: General;  Laterality:  Right;  . REDUCTION MAMMAPLASTY Right   . REMOVAL OF TISSUE EXPANDER AND PLACEMENT OF IMPLANT Left 03/03/2019   Procedure: REMOVAL OF TISSUE EXPANDER AND PLACEMENT OF EXPANDER;  Surgeon: Peggye Form, DO;  Location: [facility];  Service: Plastics;  Laterality: Left;  . REMOVAL OF TISSUE EXPANDER AND PLACEMENT OF IMPLANT Left 05/25/2019   Procedure: LEFT BREAST REMOVAL OF TISSUE EXPANDER AND PLACEMENT OF IMPLANT;  Surgeon: Peggye Form, DO;  Location: [facility];  Service: Plastics;  Laterality: Left;  . TISSUE EXPANDER PLACEMENT Left 11/03/2018   Procedure: PLACEMENT OF TISSUE EXPANDER LEFT BREAST;  Surgeon: Peggye Form, DO;  [facility];  Service: Plastics;  Laterality: Left;  Total case time is 3.5 hours  . TUBAL LIGATION Bilateral 01/21/2011   Social History   Occupational History  . Occupation: Charity fundraiser  Tobacco Use  . Smoking status: Never  . Smokeless tobacco: Never  Vaping Use  . Vaping status: Never Used  Substance and Sexual Activity  .  Alcohol use: Yes    Comment: social  . Drug use: No  . Sexual activity: Not on file    Comment: BTL

## 2023-08-28 ENCOUNTER — Other Ambulatory Visit: Payer: Self-pay

## 2023-08-28 ENCOUNTER — Ambulatory Visit (INDEPENDENT_AMBULATORY_CARE_PROVIDER_SITE_OTHER): Admitting: Orthopaedic Surgery

## 2023-08-28 ENCOUNTER — Other Ambulatory Visit (HOSPITAL_COMMUNITY): Payer: Self-pay

## 2023-08-28 ENCOUNTER — Encounter: Payer: Self-pay | Admitting: Orthopaedic Surgery

## 2023-08-28 DIAGNOSIS — M25562 Pain in left knee: Secondary | ICD-10-CM | POA: Diagnosis not present

## 2023-08-28 DIAGNOSIS — G8929 Other chronic pain: Secondary | ICD-10-CM | POA: Diagnosis not present

## 2023-08-28 MED ORDER — TRAMADOL HCL 50 MG PO TABS
50.0000 mg | ORAL_TABLET | Freq: Every day | ORAL | 0 refills | Status: DC | PRN
  Filled 2023-08-28: qty 20, 10d supply, fill #0

## 2023-09-07 ENCOUNTER — Other Ambulatory Visit (HOSPITAL_COMMUNITY): Payer: Self-pay

## 2023-09-07 ENCOUNTER — Other Ambulatory Visit: Payer: Self-pay

## 2023-09-11 ENCOUNTER — Ambulatory Visit
Admission: RE | Admit: 2023-09-11 | Discharge: 2023-09-11 | Disposition: A | Discharge status: Home / Self Care | Source: Ambulatory Visit | Attending: Orthopaedic Surgery | Admitting: Orthopaedic Surgery

## 2023-09-11 DIAGNOSIS — M25562 Pain in left knee: Secondary | ICD-10-CM | POA: Diagnosis not present

## 2023-09-11 DIAGNOSIS — M25462 Effusion, left knee: Secondary | ICD-10-CM | POA: Diagnosis not present

## 2023-09-11 DIAGNOSIS — G8929 Other chronic pain: Secondary | ICD-10-CM

## 2023-09-11 DIAGNOSIS — M23332 Other meniscus derangements, other medial meniscus, left knee: Secondary | ICD-10-CM | POA: Diagnosis not present

## 2023-09-11 DIAGNOSIS — M1712 Unilateral primary osteoarthritis, left knee: Secondary | ICD-10-CM | POA: Diagnosis not present

## 2023-09-15 ENCOUNTER — Ambulatory Visit (INDEPENDENT_AMBULATORY_CARE_PROVIDER_SITE_OTHER): Payer: 59 | Admitting: Adult Health

## 2023-09-15 ENCOUNTER — Encounter (INDEPENDENT_AMBULATORY_CARE_PROVIDER_SITE_OTHER): Payer: Self-pay | Admitting: Adult Health

## 2023-09-15 VITALS — BP 125/80 | HR 73 | Temp 98.0°F | Ht 65.0 in | Wt 267.0 lb

## 2023-09-15 DIAGNOSIS — E559 Vitamin D deficiency, unspecified: Secondary | ICD-10-CM

## 2023-09-15 DIAGNOSIS — R7303 Prediabetes: Secondary | ICD-10-CM

## 2023-09-15 DIAGNOSIS — G8929 Other chronic pain: Secondary | ICD-10-CM

## 2023-09-15 DIAGNOSIS — E669 Obesity, unspecified: Secondary | ICD-10-CM | POA: Diagnosis not present

## 2023-09-15 DIAGNOSIS — M25562 Pain in left knee: Secondary | ICD-10-CM | POA: Diagnosis not present

## 2023-09-15 DIAGNOSIS — Z6841 Body Mass Index (BMI) 40.0 and over, adult: Secondary | ICD-10-CM

## 2023-09-15 NOTE — Progress Notes (Signed)
 WEIGHT SUMMARY AND BIOMETRICS  Vitals Temp: 98 F (36.7 C) BP: 125/80 Pulse Rate: 73 SpO2: 97 %   Anthropometric Measurements Height: 5\' 5"  (1.651 m) Weight: 267 lb (121.1 kg) BMI (Calculated): 44.43 Weight at Last Visit: 262lb Weight Lost Since Last Visit: 0 Weight Gained Since Last Visit: 5lb Starting Weight: 267lb Total Weight Loss (lbs): 0 lb (0 kg)   Body Composition  Body Fat %: 51 % Fat Mass (lbs): 136.2 lbs Muscle Mass (lbs): 124.4 lbs Total Body Water (lbs): 91.2 lbs Visceral Fat Rating : 17   Other Clinical Data Fasting: yes Labs: no Today's Visit #: 22 Starting Date: 08/09/19    Chief Complaint:   OBESITY Mackenzie [name] is here to discuss her progress with her obesity treatment plan.  She is on the the Category 3 Plan and states she is following her eating plan approximately 75 % of the time.  She states she is exercising: NEAT Actitivites  Interim History:  Mackenzie [name] continues to experience chronic knee pain in BOTH knees. Recently L knee pain has worsened- recent OV with [facility]- reviewed notes/imaging with pt. MRI L knee completed 09/11/2023  In order to consume all prescribed food on MP- she was switched Lunch and Dinner timing.  Subjective:   1. Chronic pain of left knee 08/28/2023 Assessment & Plan: Visit Diagnoses:  1. Chronic pain of left knee       Plan: [name] is a 53 year old female with severe left knee pain.  I am concerned she may have an insufficiency fracture and meniscal tear.  Will order MRI to evaluate.  Tramadol prescribed for severe pain.  HPI Mackenzie [name] comes in today for follow-up evaluation of left knee pain.  She is scheduled for a Monovisc injection today but her pain has been significant since receiving a cortisone injection last month.  She does not feel that she is ready for another injection.  She is having a lot of medial sided knee pain with giving way.  2. Vitamin D deficiency  Latest Reference Range  & Units 01/19/23 09:39 06/08/23 10:36  Vitamin D, 25-Hydroxy 30.0 - 100.0 ng/mL 76.5 65.8   She endorses stable energy levels She is on daily OTC MVI and Calcium-Phosphorus-Vitamin D (VITAMIN D3/CALCIUM/PHOSPHORUS PO)   3. Pre-diabetes Lab Results  Component Value Date   HGBA1C 6.1 (H) 06/08/2023   HGBA1C 6.0 (H) 01/19/2023   HGBA1C 5.8 (H) 09/03/2022    She is on Metformin 500mg  BID and daily Wellbutrin SR 200mg  She feels that the Wellbutrin SR 200mg  may not controlling all of her cravings. BP stable at OV.  Assessment/Plan:   1. Chronic pain of left knee (Primary) Check Labs - Comprehensive metabolic panel with GFR  2. Vitamin D deficiency Check Labs - VITAMIN D 25 Hydroxy (Vit-D Deficiency, Fractures)  3. Pre-diabetes Check Labs - Hemoglobin A1c - Insulin, random - Vitamin B12 - Magnesium  4. Obesity, Current BMI 44.43  Kash is currently in the action stage of change. As such, her goal is to continue with weight loss efforts. She has agreed to the Category 3 Plan.   Exercise goals: All adults should avoid inactivity. Some physical activity is better than none, and adults who participate in any amount of physical activity gain some health benefits. Adults should also include muscle-strengthening activities that involve all major muscle groups on 2 or more days a week.  Behavioral modification strategies: increasing lean protein intake, decreasing simple carbohydrates, increasing vegetables, no skipping meals, meal  planning and cooking strategies, keeping healthy foods in the home, and planning for success.  Mackenzie [name] has agreed to follow-up with our clinic in 4 weeks. She was informed of the importance of frequent follow-up visits to maximize her success with intensive lifestyle modifications for her multiple health conditions.   Mackenzie [name] was informed we would discuss her lab results at her next visit unless there is a critical issue that needs to be addressed sooner.  Mackenzie [name] agreed to keep her next visit at the agreed upon time to discuss these results.  Objective:   Blood pressure 125/80, pulse 73, temperature 98 F (36.7 C), height 5\' 5"  (1.651 m), weight 267 lb (121.1 kg), last menstrual period 11/28/2016, SpO2 97%, unknown if currently breastfeeding. Body mass index is 44.43 kg/m.  General: Cooperative, alert, well developed, in no acute distress. HEENT: Conjunctivae and lids unremarkable. Cardiovascular: Regular rhythm.  Lungs: Normal work of breathing. Neurologic: No focal deficits.   Lab Results  Component Value Date   CREATININE 1.10 (H) 06/09/2023   BUN 28 (H) 06/09/2023   NA 132 (L) 06/09/2023   K 4.0 06/09/2023   CL 98 06/09/2023   CO2 28 06/09/2023   Lab Results  Component Value Date   ALT 28 06/09/2023   AST 21 06/09/2023   GGT 49 06/09/2023   ALKPHOS 132 (H) 06/09/2023   BILITOT 0.5 06/09/2023   Lab Results  Component Value Date   HGBA1C 6.1 (H) 06/08/2023   HGBA1C 6.0 (H) 01/19/2023   HGBA1C 5.8 (H) 09/03/2022   HGBA1C 5.4 01/29/2022   HGBA1C 5.4 12/19/2021   Lab Results  Component Value Date   INSULIN 24.1 06/08/2023   INSULIN 15.4 01/19/2023   INSULIN 13.1 09/03/2022   INSULIN 21.3 01/29/2022   INSULIN 18.2 01/07/2022   Lab Results  Component Value Date   TSH 1.39 07/16/2023   Lab Results  Component Value Date   CHOL 197 06/08/2023   HDL 56 06/08/2023   LDLCALC 124 (H) 06/08/2023   TRIG 93 06/08/2023   CHOLHDL 3.0 03/31/2023   Lab Results  Component Value Date   VD25OH 65.8 06/08/2023   VD25OH 76.5 01/19/2023   VD25OH 81.2 09/03/2022   Lab Results  Component Value Date   WBC 7.0 06/09/2023   HGB 13.4 06/09/2023   HCT 39.9 06/09/2023   MCV 83.6 06/09/2023   PLT 247 06/09/2023   No results found for: "IRON", "TIBC", "FERRITIN"  Attestation Statements:   Reviewed by clinician on day of visit: allergies, medications, problem list, medical history, surgical history, family history, social  history, and previous encounter notes.  I have reviewed the above documentation for accuracy and completeness, and I agree with the above. -  Marta Bouie d. Tylesha Gibeault, NP-C

## 2023-09-16 LAB — VITAMIN B12: Vitamin B-12: >2000 pg/mL — ABNORMAL HIGH (ref 232–1245)

## 2023-09-16 LAB — COMPREHENSIVE METABOLIC PANEL WITH GFR
ALT: 20 IU/L (ref 0–32)
AST: 18 IU/L (ref 0–40)
Albumin: 4.3 g/dL (ref 3.8–4.9)
Alkaline Phosphatase: 139 IU/L — ABNORMAL HIGH (ref 44–121)
BUN/Creatinine Ratio: 17 (ref 9–23)
BUN: 15 mg/dL (ref 6–24)
Bilirubin Total: 0.3 mg/dL (ref 0.0–1.2)
CO2: 23 mmol/L (ref 20–29)
Calcium: 9.6 mg/dL (ref 8.7–10.2)
Chloride: 102 mmol/L (ref 96–106)
Creatinine, Ser: 0.88 mg/dL (ref 0.57–1.00)
Globulin, Total: 2.9 g/dL (ref 1.5–4.5)
Glucose: 110 mg/dL — ABNORMAL HIGH (ref 70–99)
Potassium: 4.6 mmol/L (ref 3.5–5.2)
Sodium: 139 mmol/L (ref 134–144)
Total Protein: 7.2 g/dL (ref 6.0–8.5)
eGFR: 78 mL/min/{1.73_m2} (ref >59)

## 2023-09-16 LAB — VITAMIN D 25 HYDROXY (VIT D DEFICIENCY, FRACTURES): Vit D, 25-Hydroxy: 75.1 ng/mL (ref 30.0–100.0)

## 2023-09-16 LAB — HEMOGLOBIN A1C
Est. average glucose Bld gHb Est-mCnc: 123 mg/dL
Hgb A1c MFr Bld: 5.9 % — ABNORMAL HIGH (ref 4.8–5.6)

## 2023-09-16 LAB — INSULIN, RANDOM: INSULIN: 14.7 u[IU]/mL (ref 2.6–24.9)

## 2023-09-16 LAB — MAGNESIUM: Magnesium: 2.1 mg/dL (ref 1.6–2.3)

## 2023-09-18 ENCOUNTER — Telehealth: Payer: Self-pay | Admitting: Orthopaedic Surgery

## 2023-09-18 ENCOUNTER — Other Ambulatory Visit (HOSPITAL_COMMUNITY): Payer: Self-pay

## 2023-09-18 ENCOUNTER — Other Ambulatory Visit: Payer: Self-pay | Admitting: Surgical

## 2023-09-18 MED ORDER — DICLOFENAC SODIUM 75 MG PO TBEC
75.0000 mg | DELAYED_RELEASE_TABLET | Freq: Two times a day (BID) | ORAL | 0 refills | Status: DC | PRN
  Filled 2023-09-18: qty 60, 30d supply, fill #0

## 2023-09-18 MED ORDER — TRAMADOL HCL 50 MG PO TABS
50.0000 mg | ORAL_TABLET | Freq: Every day | ORAL | 0 refills | Status: DC | PRN
Start: 2023-09-18 — End: 2023-10-09
  Filled 2023-09-18: qty 20, 10d supply, fill #0

## 2023-09-18 NOTE — Telephone Encounter (Signed)
Sent in rx's.

## 2023-09-18 NOTE — Telephone Encounter (Signed)
 Patient called and said can she get a refill on ultram and Voltaren. CB#[phone]

## 2023-09-18 NOTE — Telephone Encounter (Signed)
Tried to notify patient. No answer. Voicemail is full.

## 2023-09-21 ENCOUNTER — Other Ambulatory Visit (HOSPITAL_COMMUNITY): Payer: Self-pay

## 2023-10-01 ENCOUNTER — Ambulatory Visit: Admitting: Orthopaedic Surgery

## 2023-10-09 ENCOUNTER — Other Ambulatory Visit (HOSPITAL_COMMUNITY): Payer: Self-pay

## 2023-10-09 ENCOUNTER — Ambulatory Visit (INDEPENDENT_AMBULATORY_CARE_PROVIDER_SITE_OTHER): Admitting: Orthopaedic Surgery

## 2023-10-09 DIAGNOSIS — M7661 Achilles tendinitis, right leg: Secondary | ICD-10-CM | POA: Insufficient documentation

## 2023-10-09 DIAGNOSIS — M1712 Unilateral primary osteoarthritis, left knee: Secondary | ICD-10-CM | POA: Insufficient documentation

## 2023-10-09 MED ORDER — HYALURONAN 88 MG/4ML IX SOSY
88.0000 mg | PREFILLED_SYRINGE | INTRA_ARTICULAR | Status: AC | PRN
  Administered 2023-10-09: 88 mg via INTRA_ARTICULAR

## 2023-10-09 MED ORDER — TRAMADOL HCL 50 MG PO TABS
50.0000 mg | ORAL_TABLET | Freq: Every day | ORAL | 0 refills | Status: DC | PRN
  Filled 2023-10-09: qty 30, 15d supply, fill #0

## 2023-10-09 NOTE — Progress Notes (Signed)
 Office Visit Note   Patient: Mackenzie [name]           Date of Birth: November 15, 1970           MRN: [id number] Visit Date: 10/09/2023              Requested by: Allene Ivan, MD [address] [place],  [place] [address] PCP: Allene Ivan, MD   Assessment & Plan: Visit Diagnoses:  1. Primary osteoarthritis of left knee   2. Achilles tendinitis, right leg     History of Present Illness Mackenzie [name] is a 53 year old female with who is here to discuss recent MRI results of left knee and follow up evaluation of right heel pain.  She experiences variable knee pain with episodes of locking and a sensation of giving way, which sometimes makes her feel like she is going to fall. The pain persists even when lying down with her knee straight and is primarily localized to one side of the knee. An MRI confirmed a degenerative meniscus tear and a cyst. She received a cortisone injection in March, which provided temporary relief. A gel injection was scheduled but postponed to obtain an MRI first. She takes ibuprofen  almost around the clock and is concerned about liver effects. She has tried Tylenol  Arthritis but is cautious about its use. Currently, she takes diclofenac  in the morning, ibuprofen  around mid-morning, and Ultram  (tramadol ) in the middle of the day, repeating this cycle later in the day. She tries to limit Ultram  use, especially while working.  She is attempting to lose weight but finds it challenging due to limited ability to exercise because of her knee pain. She is attending a weight loss clinic and following a restricted diet focused on protein and vegetables, but has not seen significant weight loss. Insurance has not approved medications like Ozempic  for weight loss.  Physical Exam MUSCULOSKELETAL: Knee flexibility is good. Pain in knee on palpation to the medial joint line.  No joint effusion.  Exam is unchanged from prior visit.  Results RADIOLOGY Knee MRI:  No fracture, degenerative appearing medial meniscal root tear, tricompartmental OA Heel X-ray: Bone spur, chronic Achilles tendinosis with calcification  Assessment and Plan Degenerative left knee joint disease with medial meniscus tear Chronic degenerative changes with meniscus tear confirmed by MRI. Surgery considered but may not improve symptoms and involves recovery. Gel injection approved. - Monovisc injection performed today. - Renew prescription for Ultram .  Chronic Achilles tendonitis with bone spur Chronic tendonitis with calcification causing heel pain. Conservative management with stretching and physical therapy. Surgery is a last resort due to recovery concerns.  Obesity Obesity exacerbates joint issues and limits treatment options. Weight management is difficult due to exercise limitations. Current management includes weight loss clinic and restricted diet. Insurance has not approved medications like Ozempic . - Continue working with weight loss clinic.  Follow-Up Instructions: No follow-ups on file.   Orders:  Orders Placed This Encounter  Procedures   Large Joint Inj   Meds ordered this encounter  Medications   traMADol  (ULTRAM ) 50 MG tablet    Sig: Take 1-2 tablets (50-100 mg total) by mouth daily as needed.    Dispense:  30 tablet    Refill:  0      Procedures: Large Joint Inj: L knee on 10/09/2023 10:35 AM Indications: pain Details: 22 G needle  Arthrogram: No  Medications: 88 mg Hyaluronan 88 MG/4ML Outcome: tolerated well, no immediate complications Patient  was prepped and draped in the usual sterile fashion.       Clinical Data: No additional findings.   Subjective: Chief Complaint  Patient presents with   Left Knee - Follow-up      Review of Systems  Constitutional: Negative.   HENT: Negative.    Eyes: Negative.   Respiratory: Negative.    Cardiovascular: Negative.   Endocrine: Negative.   Musculoskeletal: Negative.   Neurological:  Negative.   Hematological: Negative.   Psychiatric/Behavioral: Negative.    All other systems reviewed and are negative.    Objective: Vital Signs: LMP 11/28/2016   Physical Exam Vitals and nursing note reviewed.  Constitutional:      Appearance: She is well-developed.  HENT:     Head: Atraumatic.     Nose: Nose normal.  Eyes:     Extraocular Movements: Extraocular movements intact.  Cardiovascular:     Pulses: Normal pulses.  Pulmonary:     Effort: Pulmonary effort is normal.  Abdominal:     Palpations: Abdomen is soft.  Musculoskeletal:     Cervical back: Neck supple.  Skin:    General: Skin is warm.     Capillary Refill: Capillary refill takes less than 2 seconds.  Neurological:     Mental Status: She is alert. Mental status is at baseline.  Psychiatric:        Behavior: Behavior normal.        Thought Content: Thought content normal.        Judgment: Judgment normal.      PMFS History: Patient Active Problem List   Diagnosis Date Noted   Primary osteoarthritis of left knee 10/09/2023   Achilles tendinitis, right leg 10/09/2023   Graves disease 07/16/2023   Multinodular goiter 07/16/2023   Right carpal tunnel syndrome 07/10/2023   Left carpal tunnel syndrome 07/10/2023   Pain of Right Knee 07/06/2023   History of breast reconstruction 05/15/2023   Pure hypercholesterolemia 03/30/2023   Acute meniscal tear, medial, right, initial encounter 11/12/2022   Plica syndrome of right knee 11/12/2022   Chondromalacia, right knee 11/12/2022   Depression 08/13/2022   BMI 40.0-44.9, adult (HCC) 08/13/2022   Obesity, Beginning BMI 08/13/2022   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   Boil of buttock, Left 12/24/2021   Hyperthyroidism 07/22/2021   Low serum thyroid  stimulating hormone (TSH) 06/12/2021   Other fatigue 05/23/2021   Hemoglobin low 05/23/2021   Polyphagia 05/16/2020   Vitamin D  deficiency 05/16/2020   At risk for osteoporosis 05/16/2020    Prediabetes 05/01/2020   Elevated blood pressure reading 05/01/2020   At risk for impaired metabolic function 05/01/2020   Breast asymmetry following reconstructive surgery 09/20/2019   Essential hypertension 08/17/2018   Class 3 severe obesity with serious comorbidity and body mass index (BMI) of 40.0 to 44.9 in adult 09/17/2017   Acquired absence of left breast 09/15/2017   Malignant neoplasm of upper-inner quadrant of left breast in female, 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   Healthcare maintenance 03/25/2017   Malignant 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, estrogen receptor positive (HCC) 01/23/2017   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, left (HCC) 01/23/2017   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   Allergic rhinitis 2012   Anemia 01/27/2017   prior to starting chemotherapy   BMI 40.0-44.9, adult (HCC)    Breast cancer (HCC) 01/14/2017   Left breast  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, left (HCC) 01/23/2017   Cellulitis of right leg    Edema, lower extremity    GERD (gastroesophageal reflux disease)    Healthcare maintenance 03/25/2017   Herpesviral infection    Hot flashes 03/2017   Hyperlipidemia 03/20/2016   Impaired fasting glucose    Morbid obesity with body mass index (BMI) of 40.0 to 49.9 (HCC) 09/17/2017   Neoplasm of cervix    Osteoarthritis of knee    Other chest pain    Other specified disorders of veins    Polyneuropathy    Postmenopausal atrophic vaginitis    Pre-diabetes 07/26/2018   Hgb A1C elevated on 07/26/2018, Gestational Diabetes 2012   Pure hypercholesterolemia 03/30/2023   Seasonal allergies 2012   seasonal allergies causes allergic rhinitis and itchy, dry eyes per pt   Stress incontinence    Tachycardia    Thyrotoxicosis    Vitamin D  deficiency 05/2015    Family History  Problem Relation Age of Onset    Other Mother        d.56 from complications of surgery to remove brain tumor   Diabetes Mother    Hypertension Mother    Stroke Mother    Kidney disease Father    Hypertension Maternal Grandmother    Breast cancer Paternal Grandmother 42       d.60s from breast cancer. Did not have treatment.   Hypertension Other    Diabetes Other    Stroke Other     Past Surgical History:  Procedure Laterality Date   BREAST RECONSTRUCTION WITH PLACEMENT OF TISSUE EXPANDER AND FLEX HD (ACELLULAR HYDRATED DERMIS) Left 08/27/2017   Procedure: LEFT BREAST RECONSTRUCTION WITH PLACEMENT OF TISSUE EXPANDER AND FLEX HD;  Surgeon: Thornell Flirt, DO;  Location: [facility];  Service: Plastics;  Laterality: Left;   CESAREAN SECTION     x2   KNEE ARTHROSCOPY WITH MEDIAL MENISECTOMY Right 11/12/2022   Procedure: RIGHT KNEE ARTHROSCOPY, PARTIAL MEDIAL MENISCECTOMY;  Surgeon: Wes Hamman, MD;  Location: [facility];  Service: Orthopedics;  Laterality: Right;   LATISSIMUS FLAP TO BREAST Left 11/03/2018   LATISSIMUS FLAP TO BREAST Left 11/03/2018   Procedure: LATISSIMUS FLAP TO LEFT BREAST;  Surgeon: Thornell Flirt, DO;  [facility]: [facility];  Service: Plastics;  Laterality: Left;   MASTECTOMY MODIFIED RADICAL Left 08/27/2017   MASTECTOMY MODIFIED RADICAL Left 08/27/2017   Procedure: LEFT MODIFIED RADICAL MASTECTOMY;  Surgeon: Sim Dryer, MD;  Location: [facility];  Service: General;  Laterality: Left;   MASTOPEXY Right 03/03/2019   Procedure: RIGHT BREAST MASTOPEXY/REDUCTION;  Surgeon: Thornell Flirt, DO;  Location: [facility];  Service: Plastics;  Laterality: Right;  3 hours, please   PORT-A-CATH REMOVAL Right 08/27/2017   Procedure: REMOVAL PORT-A-CATH RIGHT CHEST;  Surgeon: Sim Dryer, MD;  Location: [facility];  Service: General;  Laterality: Right;   PORTACATH PLACEMENT Right 01/28/2017   Procedure: INSERTION PORT-A-CATH WITH ULTRASOUND;  Surgeon: Sim Dryer, MD;  Location:  [facility];  Service: General;  Laterality: Right;   REDUCTION MAMMAPLASTY Right    REMOVAL OF TISSUE EXPANDER AND PLACEMENT OF IMPLANT Left 03/03/2019   Procedure: REMOVAL OF TISSUE EXPANDER AND PLACEMENT OF EXPANDER;  Surgeon: Thornell Flirt, DO;  Location: [facility];  Service: Plastics;  Laterality: Left;   REMOVAL OF TISSUE EXPANDER AND PLACEMENT OF IMPLANT Left 05/25/2019   Procedure: LEFT BREAST REMOVAL OF TISSUE EXPANDER AND PLACEMENT OF IMPLANT;  Surgeon: Thornell Flirt, DO;  Location: [facility];  Service: Plastics;  Laterality: Left;   TISSUE EXPANDER PLACEMENT Left 11/03/2018   Procedure: PLACEMENT OF TISSUE EXPANDER LEFT BREAST;  Surgeon: Thornell Flirt, DO;  [facility];  Service: Plastics;  Laterality: Left;  Total case time is 3.5 hours   TUBAL LIGATION Bilateral 01/21/2011   Social History   Occupational History   Occupation: Charity fundraiser  Tobacco Use   Smoking status: Never   Smokeless tobacco: Never  Vaping Use   Vaping status: Never Used  Substance and Sexual Activity   Alcohol use: Yes    Comment: social   Drug use: No   Sexual activity: Not on file    Comment: BTL

## 2023-10-13 ENCOUNTER — Other Ambulatory Visit (HOSPITAL_COMMUNITY): Payer: Self-pay

## 2023-10-13 ENCOUNTER — Ambulatory Visit (INDEPENDENT_AMBULATORY_CARE_PROVIDER_SITE_OTHER): Admitting: Adult Health

## 2023-10-13 ENCOUNTER — Other Ambulatory Visit: Payer: Self-pay | Admitting: Hematology and Oncology

## 2023-10-13 ENCOUNTER — Encounter (INDEPENDENT_AMBULATORY_CARE_PROVIDER_SITE_OTHER): Payer: Self-pay | Admitting: Adult Health

## 2023-10-13 VITALS — BP 128/81 | HR 72 | Temp 97.8°F | Ht 65.0 in | Wt 266.0 lb

## 2023-10-13 DIAGNOSIS — Z1231 Encounter for screening mammogram for malignant neoplasm of breast: Secondary | ICD-10-CM

## 2023-10-13 DIAGNOSIS — I10 Essential (primary) hypertension: Secondary | ICD-10-CM

## 2023-10-13 DIAGNOSIS — E559 Vitamin D deficiency, unspecified: Secondary | ICD-10-CM | POA: Diagnosis not present

## 2023-10-13 DIAGNOSIS — R7303 Prediabetes: Secondary | ICD-10-CM | POA: Diagnosis not present

## 2023-10-13 DIAGNOSIS — M25562 Pain in left knee: Secondary | ICD-10-CM | POA: Diagnosis not present

## 2023-10-13 DIAGNOSIS — Z6841 Body Mass Index (BMI) 40.0 and over, adult: Secondary | ICD-10-CM | POA: Diagnosis not present

## 2023-10-13 DIAGNOSIS — E669 Obesity, unspecified: Secondary | ICD-10-CM

## 2023-10-13 DIAGNOSIS — F5089 Other specified eating disorder: Secondary | ICD-10-CM

## 2023-10-13 DIAGNOSIS — M1712 Unilateral primary osteoarthritis, left knee: Secondary | ICD-10-CM | POA: Diagnosis not present

## 2023-10-13 DIAGNOSIS — G8929 Other chronic pain: Secondary | ICD-10-CM | POA: Diagnosis not present

## 2023-10-13 DIAGNOSIS — F3289 Other specified depressive episodes: Secondary | ICD-10-CM

## 2023-10-13 MED ORDER — METFORMIN HCL 500 MG PO TABS
500.0000 mg | ORAL_TABLET | Freq: Three times a day (TID) | ORAL | 0 refills | Status: DC
  Filled 2023-10-13: qty 90, 30d supply, fill #0

## 2023-10-13 MED ORDER — BUPROPION HCL ER (SR) 200 MG PO TB12
200.0000 mg | ORAL_TABLET | Freq: Every day | ORAL | 0 refills | Status: DC
Start: 2023-10-13 — End: 2023-12-14
  Filled 2023-10-13: qty 90, 90d supply, fill #0

## 2023-10-13 NOTE — Progress Notes (Signed)
 WEIGHT SUMMARY AND BIOMETRICS  Vitals Temp: 97.8 F (36.6 C) BP: 128/81 Pulse Rate: 72 SpO2: 99 %   Anthropometric Measurements Height: 5' (1.524 m) Weight: 266 lb (120.7 kg) BMI (Calculated): 51.95 Weight at Last Visit: 267 lb Weight Lost Since Last Visit: 1 lb Weight Gained Since Last Visit: 0 Starting Weight: 267 lb Total Weight Loss (lbs): 1 lb (0.454 kg)   Body Composition  Body Fat %: 50.1 % Fat Mass (lbs): 133.2 lbs Muscle Mass (lbs): 126.2 lbs Total Body Water (lbs): 89.8 lbs Visceral Fat Rating : 17   Other Clinical Data Fasting: no Labs: no Today's Visit #: 23 Starting Date: 08/09/19    Chief Complaint:   OBESITY Mackenzie [name] is here to discuss her progress with her obesity treatment plan.  She is on the the Category 3 Plan and states she is following her eating plan approximately 70 % of the time.  She states she is exercising: NEAT Activities.  Interim History:  Mackenzie [name] provided food recall that is typical of a day: Breakfast: Fruit smoothie- kale/spinach, strab, blue, mangos, rashberries, banana, seeds with scoop Whey protein (100 cal, 30g protein) Lunch: 2 cups veg with protein (salmon or chicken) Dinner: Frozen meal or Fruit Smoothie   08/09/19 09:00  RMR 1588    02/08/20 08:00  RMR 1999   Recommend rechecking IC at next OV  Subjective:   1. Chronic pain of left knee 10/09/2023 Assessment & Plan: Visit Diagnoses:  1. Primary osteoarthritis of left knee   2. Achilles tendinitis, right leg       History of Present Illness Mackenzie [name] is a 53 year old female with who is here to discuss recent MRI results of left knee and follow up evaluation of right heel pain.   She experiences variable knee pain with episodes of locking and a sensation of giving way, which sometimes makes her feel like she is going to fall. The pain persists even when lying down with her knee straight and is primarily localized to one side of the knee. An  MRI confirmed a degenerative meniscus tear and a cyst. She received a cortisone injection in March, which provided temporary relief. A gel injection was scheduled but postponed to obtain an MRI first. She takes ibuprofen  almost around the clock and is concerned about liver effects. She has tried Tylenol  Arthritis but is cautious about its use. Currently, she takes diclofenac  in the morning, ibuprofen  around mid-morning, and Ultram  (tramadol ) in the middle of the day, repeating this cycle later in the day. She tries to limit Ultram  use, especially while working.   She is attempting to lose weight but finds it challenging due to limited ability to exercise because of her knee pain. She is attending a weight loss clinic and following a restricted diet focused on protein and vegetables, but has not seen significant weight loss. Insurance has not approved medications like Ozempic  for weight loss.  Assessment and Plan Degenerative left knee joint disease with medial meniscus tear Chronic degenerative changes with meniscus tear confirmed by MRI. Surgery considered but may not improve symptoms and involves recovery. Gel injection approved. - Monovisc injection performed today. - Renew prescription for Ultram .  2. Essential hypertension Discussed Labs  BP well controlled She denies CP with exertion She denies tobacco/vape use  3. Vitamin D  deficiency Discussed Labs  Latest Reference Range & Units 09/15/23 09:16  Vitamin D , 25-Hydroxy 30.0 - 100.0 ng/mL 75.1  Vitamin B12 232 - 1245  pg/mL >2000 (H)  (H): Data is abnormally high  Vit D level at goal She is on Calcium -Phosphorus-Vitamin D  (VITAMIN D3/CALCIUM /PHOSPHORUS PO)  B12 over replaced- she stopped oral B12 supplementation on/about 09/16/2023  4. Pre-diabetes Discussed Labs  Latest Reference Range & Units 09/15/23 09:16  eGFR >59 mL/min/1.73 78   5. Emotional Eating Behavior BP/HR stable  She endorses stable mood, denies SI/HI She is  morning Wellbutrin  SR 200mg   Assessment/Plan:   1. Chronic pain of left knee (Primary) Continue with weight loss efforts  2. Essential hypertension Limit Na+ Continue healthy eating  3. Vitamin D  deficiency Continue current Vit D supplementation-Calcium -Phosphorus-Vitamin D  (VITAMIN D3/CALCIUM /PHOSPHORUS PO)   4. Pre-diabetes Refill and INCREASE - metFORMIN  (GLUCOPHAGE ) 500 MG tablet; Take 1 tablet (500 mg total) by mouth 3 (three) times daily with meals.  Dispense: 90 tablet; Refill: 0  5. Emotional Eating Behavior Refill - buPROPion  (WELLBUTRIN  SR) 200 MG 12 hr tablet; Take 1 tablet (200 mg total) by mouth daily.  Dispense: 90 tablet; Refill: 0  6. Obesity, Current BMI 44.3  Mackenzie [name] is currently in the action stage of change. As such, her goal is to continue with weight loss efforts. She has agreed to the Category 3 Plan.   Exercise goals: All adults should avoid inactivity. Some physical activity is better than none, and adults who participate in any amount of physical activity gain some health benefits. Adults should also include muscle-strengthening activities that involve all major muscle groups on 2 or more days a week.  Behavioral modification strategies: increasing lean protein intake, decreasing simple carbohydrates, increasing vegetables, increasing water intake, no skipping meals, meal planning and cooking strategies, keeping healthy foods in the home, and planning for success.  [name] has agreed to follow-up with our clinic in 4 weeks. She was informed of the importance of frequent follow-up visits to maximize her success with intensive lifestyle modifications for her multiple health conditions.   Check IC at next OV- pt aware to arrive 30 mins early and to be fasting.  Objective:   Blood pressure 128/81, pulse 72, temperature 97.8 F (36.6 C), height 5' (1.524 m), weight 266 lb (120.7 kg), last menstrual period 11/28/2016, SpO2 99%, unknown if currently  breastfeeding. Body mass index is 51.95 kg/m.  General: Cooperative, alert, well developed, in no acute distress. HEENT: Conjunctivae and lids unremarkable. Cardiovascular: Regular rhythm.  Lungs: Normal work of breathing. Neurologic: No focal deficits.   Lab Results  Component Value Date   CREATININE 0.88 09/15/2023   BUN 15 09/15/2023   NA 139 09/15/2023   K 4.6 09/15/2023   CL 102 09/15/2023   CO2 23 09/15/2023   Lab Results  Component Value Date   ALT 20 09/15/2023   AST 18 09/15/2023   GGT 49 06/09/2023   ALKPHOS 139 (H) 09/15/2023   BILITOT 0.3 09/15/2023   Lab Results  Component Value Date   HGBA1C 5.9 (H) 09/15/2023   HGBA1C 6.1 (H) 06/08/2023   HGBA1C 6.0 (H) 01/19/2023   HGBA1C 5.8 (H) 09/03/2022   HGBA1C 5.4 01/29/2022   Lab Results  Component Value Date   INSULIN  14.7 09/15/2023   INSULIN  24.1 06/08/2023   INSULIN  15.4 01/19/2023   INSULIN  13.1 09/03/2022   INSULIN  21.3 01/29/2022   Lab Results  Component Value Date   TSH 1.39 07/16/2023   Lab Results  Component Value Date   CHOL 197 06/08/2023   HDL 56 06/08/2023   LDLCALC 124 (H) 06/08/2023   TRIG 93 06/08/2023  CHOLHDL 3.0 03/31/2023   Lab Results  Component Value Date   VD25OH 75.1 09/15/2023   VD25OH 65.8 06/08/2023   VD25OH 76.5 01/19/2023   Lab Results  Component Value Date   WBC 7.0 06/09/2023   HGB 13.4 06/09/2023   HCT 39.9 06/09/2023   MCV 83.6 06/09/2023   PLT 247 06/09/2023   No results found for: "IRON", "TIBC", "FERRITIN"  Attestation Statements:   Reviewed by clinician on day of visit: allergies, medications, problem list, medical history, surgical history, family history, social history, and previous encounter notes.  I have reviewed the above documentation for accuracy and completeness, and I agree with the above. -  Yuvia Plant d. Clarabelle Oscarson, NP-C

## 2023-10-21 ENCOUNTER — Other Ambulatory Visit (HOSPITAL_COMMUNITY): Payer: Self-pay

## 2023-10-23 ENCOUNTER — Other Ambulatory Visit (HOSPITAL_COMMUNITY): Payer: Self-pay

## 2023-10-26 ENCOUNTER — Other Ambulatory Visit (HOSPITAL_COMMUNITY): Payer: Self-pay

## 2023-11-11 ENCOUNTER — Other Ambulatory Visit (HOSPITAL_COMMUNITY): Payer: Self-pay

## 2023-11-11 ENCOUNTER — Other Ambulatory Visit (INDEPENDENT_AMBULATORY_CARE_PROVIDER_SITE_OTHER): Payer: Self-pay | Admitting: Adult Health

## 2023-11-11 ENCOUNTER — Telehealth (INDEPENDENT_AMBULATORY_CARE_PROVIDER_SITE_OTHER): Payer: Self-pay

## 2023-11-11 ENCOUNTER — Other Ambulatory Visit: Payer: Self-pay | Admitting: Physician Assistant

## 2023-11-11 ENCOUNTER — Telehealth: Payer: Self-pay | Admitting: Orthopaedic Surgery

## 2023-11-11 ENCOUNTER — Ambulatory Visit (INDEPENDENT_AMBULATORY_CARE_PROVIDER_SITE_OTHER): Admitting: Adult Health

## 2023-11-11 VITALS — BP 120/75 | HR 70 | Temp 98.7°F | Ht 65.0 in | Wt 261.0 lb

## 2023-11-11 DIAGNOSIS — I10 Essential (primary) hypertension: Secondary | ICD-10-CM

## 2023-11-11 DIAGNOSIS — E559 Vitamin D deficiency, unspecified: Secondary | ICD-10-CM

## 2023-11-11 DIAGNOSIS — E669 Obesity, unspecified: Secondary | ICD-10-CM

## 2023-11-11 DIAGNOSIS — R0602 Shortness of breath: Secondary | ICD-10-CM | POA: Diagnosis not present

## 2023-11-11 DIAGNOSIS — R1011 Right upper quadrant pain: Secondary | ICD-10-CM | POA: Diagnosis not present

## 2023-11-11 DIAGNOSIS — Z6841 Body Mass Index (BMI) 40.0 and over, adult: Secondary | ICD-10-CM

## 2023-11-11 DIAGNOSIS — R7303 Prediabetes: Secondary | ICD-10-CM

## 2023-11-11 MED ORDER — DICLOFENAC SODIUM 75 MG PO TBEC
75.0000 mg | DELAYED_RELEASE_TABLET | Freq: Two times a day (BID) | ORAL | 0 refills | Status: AC | PRN
  Filled 2023-11-11 – 2023-11-13 (×2): qty 60, 30d supply, fill #0

## 2023-11-11 MED ORDER — WEGOVY 0.25 MG/0.5ML ~~LOC~~ SOAJ
0.2500 mg | SUBCUTANEOUS | 0 refills | Status: DC
  Filled 2023-11-11: qty 2, 28d supply, fill #0

## 2023-11-11 MED ORDER — TRAMADOL HCL 50 MG PO TABS
50.0000 mg | ORAL_TABLET | Freq: Every day | ORAL | 0 refills | Status: DC | PRN
  Filled 2023-11-11 – 2023-11-13 (×2): qty 30, 15d supply, fill #0

## 2023-11-11 MED ORDER — METFORMIN HCL 500 MG PO TABS
500.0000 mg | ORAL_TABLET | Freq: Three times a day (TID) | ORAL | 0 refills | Status: DC
  Filled 2023-11-11: qty 90, 30d supply, fill #0

## 2023-11-11 MED ORDER — METFORMIN HCL ER 500 MG PO TB24
500.0000 mg | ORAL_TABLET | Freq: Two times a day (BID) | ORAL | 0 refills | Status: DC
  Filled 2023-11-11: qty 180, 90d supply, fill #0

## 2023-11-11 NOTE — Telephone Encounter (Signed)
 Rx refill Voltaren  & Ultram    [facility]

## 2023-11-11 NOTE — Progress Notes (Signed)
 WEIGHT SUMMARY AND BIOMETRICS  Vitals Temp: 98.7 F (37.1 C) BP: 120/75 Pulse Rate: 70 SpO2: 98 %   Anthropometric Measurements Height: 5' (1.524 m) Weight: 261 lb (118.4 kg) BMI (Calculated): 50.97 Weight at Last Visit: 266 lb Weight Lost Since Last Visit: 5 lb Weight Gained Since Last Visit: 0 Starting Weight: 267 lb Total Weight Loss (lbs): 6 lb (2.722 kg)   Body Composition  Body Fat %: 53.6 % Fat Mass (lbs): 140.4 lbs Muscle Mass (lbs): 115.2 lbs Total Body Water (lbs): 83.6 lbs Visceral Fat Rating : 20   Other Clinical Data RMR: 1901 Fasting: yes Labs: yes Today's Visit #: 65 Starting Date: 08/09/19 Comments: IC testing today    Chief Complaint:   OBESITY Melayah is here to discuss her progress with her obesity treatment plan.  She is on the the Category 3 Plan and states she is following her eating plan approximately 70 % of the time.  She states she is exercising Walking 10-15 minutes 3-4 times per week.  Interim History:  Mackenzie [name] continues to experience stiffness of R knee and worsening chronic L knee pain She present to clinic ambulating with a cane. 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 was 2018  Subjective:   1. Pre-diabetes Discussed Labs Lab Results  Component Value Date   HGBA1C 5.9 (H) 09/15/2023   HGBA1C 6.1 (H) 06/08/2023   HGBA1C 6.0 (H) 01/19/2023     Latest Reference Range & Units 01/19/23 09:39 06/08/23 10:36 09/15/23 09:16  INSULIN  2.6 - 24.9 uIU/mL 15.4 24.1 14.7   CBG, A1c, Insulin  levels all above goal A1c and Insulin  levels did improve from last check She is currently on Metformin  500mg  TID- will experience loose stools Wegovy  Rx sent in at last OV- awaiting response from insurance  2. SOBOE  (shortness of breath on exertion)  02/08/20 08:00  RMR 1999    11/11/23 08:00  RMR 1901   She endorses dyspnea with exertion, denies CP Metabolism faster than expected, however slightly decreased  3. Essential hypertension Discussed Labs  BP at goal at OV  09/15/2023 CMP: Electrolytes and Kidney fx- WNL Alk phos elevated  CBG elevated She is currently on: rosuvastatin  (CRESTOR ) 20 MG tablet  spironolactone  (ALDACTONE ) 25 MG tablet  losartan  (COZAAR ) 50 MG tablet  metoprolol  succinate (TOPROL -XL) 50 MG 24 hr tablet   4. RUQ Pain She endorses intermittent, brief RUQ pain Described as "dull, achy pain". Rated 5/10 Pain will self resolve  She endorses family hx of liver diease  09/15/2023 CMP: Alk Phos elevated  04/03/2021 Narrative & Impression  CLINICAL DATA:  Upper abdominal pain and elevated liver enzymes. History of breast carcinoma   EXAM: ULTRASOUND ABDOMEN LIMITED RIGHT UPPER QUADRANT   COMPARISON:  None.   FINDINGS: Gallbladder:   Within the gallbladder, there are echogenic foci which move and shadow consistent with cholelithiasis. Largest gallstone measures 1.0 cm in length. There is no appreciable gallbladder wall thickening or pericholecystic fluid. No sonographic Murphy sign noted by sonographer.   Common bile duct:   Diameter: 6 mm. No intrahepatic or extrahepatic biliary duct dilatation.   Liver:   There is a mildly complex cystic area in the left lobe of the liver measuring 1.6 x 1.5  x 2.2 cm. The liver has a somewhat inhomogeneous echotexture. There is no disruption of hepatic architecture. Portal vein is patent on color Doppler imaging with normal direction of blood flow towards the liver.   Other: None.   IMPRESSION: 1. Cholelithiasis. No gallbladder wall thickening or pericholecystic fluid.   2. Somewhat complex cystic area in the left lobe of the liver measuring 1.6 x 1.5 x 2.2 cm. Note that the liver has a somewhat inhomogeneous  echotexture which may be indicative of fatty infiltration. Infiltrative neoplasm causing this inhomogeneous appearance of the liver is a less likely but possible differential consideration given history of breast carcinoma. Given this circumstance, MR or CT of the liver pre and post-contrast to further evaluate would be reasonable advisable.   These results will be called to the ordering clinician or representative by the Radiologist Assistant, and communication documented in the PACS or zVision Dashboard.   5. Vitamin D  deficiency Discussed Labs  Latest Reference Range & Units 09/15/23 09:16  Vitamin D , 25-Hydroxy 30.0 - 100.0 ng/mL 75.1  Vitamin B12 232 - 1245 pg/mL >2000 (H)  (H): Data is abnormally high  Vit D level at goal B12 level over replaced She is on daily OTC MVI, daily OTC B12 complex, and daily OTC Calcium -Phosphorus-Vitamin D  (VITAMIN D3/CALCIUM /PHOSPHORUS PO)  B12 complex not listed on MAR  Assessment/Plan:   1. Pre-diabetes Stop Metformin  plain 500mg  TID  START metFORMIN  (GLUCOPHAGE -XR) 500 MG 24 hr tablet Take 1 tablet (500 mg total) by mouth 2 (two) times daily with a meal. Dispense: 180 tablet, Refills: 0 of 0 remaining   2. SOBOE (shortness of breath on exertion) (Primary) Continue Cat 3 MP  3. Essential hypertension Limit Na+ Continue  rosuvastatin  (CRESTOR ) 20 MG tablet  spironolactone  (ALDACTONE ) 25 MG tablet  losartan  (COZAAR ) 50 MG tablet  metoprolol  succinate (TOPROL -XL) 50 MG 24 hr tablet   4. RUQ Pain  US  Abdomen Limited RUQ (LIVER/GB)         Future, Expected: 11/18/2023, Expires: 02/11/2024, Routine, Ancillary Performed   5. Vitamin D  deficiency Continue Calcium -Phosphorus-Vitamin D  (VITAMIN D3/CALCIUM /PHOSPHORUS PO)  Stop MVI and B12 complex Recheck B12 level in 6-8 weeks  6. Obesity, Current BMI 51.1  Mackenzie [name] is currently in the action stage of change. As such, her goal is to continue with weight loss efforts. She has agreed to the  Category 3 Plan.   Exercise goals: All adults should avoid inactivity. Some physical activity is better than none, and adults who participate in any amount of physical activity gain some health benefits. Adults should also include muscle-strengthening activities that involve all major muscle groups on 2 or more days a week.  Behavioral modification strategies: increasing lean protein intake, decreasing simple carbohydrates, increasing vegetables, increasing water intake, meal planning and cooking strategies, keeping healthy foods in the home, ways to avoid boredom eating, and planning for success.  Mackenzie [name] has agreed to follow-up with our clinic in 4 weeks. She was informed of the importance of frequent follow-up visits to maximize her success with intensive lifestyle modifications for her multiple health conditions.   Objective:   Blood pressure 120/75, pulse 70, temperature 98.7 F (37.1 C), height 5' (1.524 m), weight 261 lb (118.4 kg), last menstrual period 11/28/2016, SpO2 98%, unknown if currently breastfeeding. Body mass index is 50.97 kg/m.  General: Cooperative, alert, well developed, in no acute distress. HEENT: Conjunctivae and lids unremarkable. Cardiovascular: Regular rhythm.  Lungs: Normal work of breathing. Neurologic: No focal deficits.   Lab Results  Component Value Date   CREATININE 0.88 09/15/2023   BUN 15 09/15/2023   NA 139 09/15/2023   K 4.6 09/15/2023   CL 102 09/15/2023   CO2 23 09/15/2023   Lab Results  Component Value Date   ALT 20 09/15/2023   AST 18 09/15/2023   GGT 49 06/09/2023   ALKPHOS 139 (H) 09/15/2023   BILITOT 0.3 09/15/2023   Lab Results  Component Value Date   HGBA1C 5.9 (H) 09/15/2023   HGBA1C 6.1 (H) 06/08/2023   HGBA1C 6.0 (H) 01/19/2023   HGBA1C 5.8 (H) 09/03/2022   HGBA1C 5.4 01/29/2022   Lab Results  Component Value Date   INSULIN  14.7 09/15/2023   INSULIN  24.1 06/08/2023   INSULIN  15.4 01/19/2023   INSULIN  13.1 09/03/2022    INSULIN  21.3 01/29/2022   Lab Results  Component Value Date   TSH 1.39 07/16/2023   Lab Results  Component Value Date   CHOL 197 06/08/2023   HDL 56 06/08/2023   LDLCALC 124 (H) 06/08/2023   TRIG 93 06/08/2023   CHOLHDL 3.0 03/31/2023   Lab Results  Component Value Date   VD25OH 75.1 09/15/2023   VD25OH 65.8 06/08/2023   VD25OH 76.5 01/19/2023   Lab Results  Component Value Date   WBC 7.0 06/09/2023   HGB 13.4 06/09/2023   HCT 39.9 06/09/2023   MCV 83.6 06/09/2023   PLT 247 06/09/2023   No results found for: "IRON", "TIBC", "FERRITIN"  Attestation Statements:   Reviewed by clinician on day of visit: allergies, medications, problem list, medical history, surgical history, family history, social history, and previous encounter notes.  I have reviewed the above documentation for accuracy and completeness, and I agree with the above. -  Marjorie Deprey d. Daxx Tiggs, NP-C

## 2023-11-11 NOTE — Telephone Encounter (Signed)
 sent

## 2023-11-11 NOTE — Telephone Encounter (Signed)
 PA for Wegovy  had an instant reply, which was as follows:     Information regarding your request This drug/product is not covered under the pharmacy benefit. Prior Authorization is not available.

## 2023-11-11 NOTE — Telephone Encounter (Signed)
 PA for Wegovy 0.25 has been submitted, awaiting PA questions.

## 2023-11-12 ENCOUNTER — Ambulatory Visit
Admission: RE | Admit: 2023-11-12 | Discharge: 2023-11-12 | Disposition: A | Discharge status: Home / Self Care | Source: Ambulatory Visit | Attending: Hematology and Oncology

## 2023-11-12 DIAGNOSIS — Z1231 Encounter for screening mammogram for malignant neoplasm of breast: Secondary | ICD-10-CM | POA: Diagnosis not present

## 2023-11-12 HISTORY — DX: Personal history of antineoplastic chemotherapy: Z92.21

## 2023-11-12 HISTORY — DX: Personal history of irradiation: Z92.3

## 2023-11-13 ENCOUNTER — Other Ambulatory Visit (HOSPITAL_COMMUNITY): Payer: Self-pay

## 2023-11-18 ENCOUNTER — Other Ambulatory Visit: Payer: Self-pay | Admitting: *Deleted

## 2023-11-18 DIAGNOSIS — Z170 Estrogen receptor positive status [ER+]: Secondary | ICD-10-CM

## 2023-11-19 ENCOUNTER — Other Ambulatory Visit (HOSPITAL_COMMUNITY): Payer: Self-pay

## 2023-11-25 ENCOUNTER — Other Ambulatory Visit (HOSPITAL_COMMUNITY): Payer: Self-pay

## 2023-11-26 ENCOUNTER — Other Ambulatory Visit (HOSPITAL_COMMUNITY): Payer: Self-pay

## 2023-11-27 ENCOUNTER — Telehealth: Payer: Self-pay | Admitting: *Deleted

## 2023-11-27 ENCOUNTER — Other Ambulatory Visit (HOSPITAL_COMMUNITY): Payer: Self-pay

## 2023-11-27 NOTE — Telephone Encounter (Signed)
 Mackenzie [name] study; Patient confirmed her appt to see Dr. Arno Bibles and have labs on Tuesday 6/24.  Informed patient the lab is for hormone levels and not required by study. She does not need to fast for this visit. Informed patient that this research nurse has another appointment at the same time so someone else in our research depart may bring her the questionnaires to complete. Asked patient to call me if any questions before or after the visit. She confirmed no Serious adverse events in the past 6 months and she continues to take Letrozole  daily as prescribed. She also asked what the standard surveillance is at this point to monitor for recurrence of her breast cancer.  Informed patient that the study requires clinical exams and annual mammograms. Dr. Arno Bibles can order any other tests as needed based on symptoms or exam. Encouraged patient to discuss this question with Dr. Arno Bibles at her visit and she verbalized understanding.  Thanked patient for her ongoing participation in this study.  Mackenzie [name], BSN, RN, [employer] Research Nurse II [phone] 11/27/2023 2:43 PM

## 2023-11-30 ENCOUNTER — Ambulatory Visit: Admitting: Hematology and Oncology

## 2023-11-30 ENCOUNTER — Telehealth: Payer: Self-pay

## 2023-11-30 ENCOUNTER — Other Ambulatory Visit

## 2023-11-30 NOTE — Telephone Encounter (Signed)
 Tried to leave message to confirm appt for 6/24 but voicemail box is full

## 2023-12-01 ENCOUNTER — Other Ambulatory Visit (HOSPITAL_COMMUNITY): Payer: Self-pay

## 2023-12-01 ENCOUNTER — Other Ambulatory Visit: Payer: Self-pay

## 2023-12-01 ENCOUNTER — Inpatient Hospital Stay (HOSPITAL_BASED_OUTPATIENT_CLINIC_OR_DEPARTMENT_OTHER): Admitting: Hematology and Oncology

## 2023-12-01 ENCOUNTER — Encounter: Payer: Self-pay | Admitting: *Deleted

## 2023-12-01 ENCOUNTER — Inpatient Hospital Stay: Attending: Hematology and Oncology

## 2023-12-01 DIAGNOSIS — Z006 Encounter for examination for normal comparison and control in clinical research program: Secondary | ICD-10-CM | POA: Diagnosis not present

## 2023-12-01 DIAGNOSIS — E0590 Thyrotoxicosis, unspecified without thyrotoxic crisis or storm: Secondary | ICD-10-CM | POA: Diagnosis not present

## 2023-12-01 DIAGNOSIS — C773 Secondary and unspecified malignant neoplasm of axilla and upper limb lymph nodes: Secondary | ICD-10-CM | POA: Diagnosis not present

## 2023-12-01 DIAGNOSIS — R5383 Other fatigue: Secondary | ICD-10-CM | POA: Diagnosis not present

## 2023-12-01 DIAGNOSIS — Z170 Estrogen receptor positive status [ER+]: Secondary | ICD-10-CM | POA: Insufficient documentation

## 2023-12-01 DIAGNOSIS — Z0001 Encounter for general adult medical examination with abnormal findings: Secondary | ICD-10-CM | POA: Diagnosis not present

## 2023-12-01 DIAGNOSIS — I10 Essential (primary) hypertension: Secondary | ICD-10-CM | POA: Diagnosis not present

## 2023-12-01 DIAGNOSIS — C50212 Malignant neoplasm of upper-inner quadrant of left female breast: Secondary | ICD-10-CM | POA: Insufficient documentation

## 2023-12-01 DIAGNOSIS — E785 Hyperlipidemia, unspecified: Secondary | ICD-10-CM | POA: Diagnosis not present

## 2023-12-01 DIAGNOSIS — E559 Vitamin D deficiency, unspecified: Secondary | ICD-10-CM | POA: Diagnosis not present

## 2023-12-01 DIAGNOSIS — Z79811 Long term (current) use of aromatase inhibitors: Secondary | ICD-10-CM | POA: Insufficient documentation

## 2023-12-01 DIAGNOSIS — C50812 Malignant neoplasm of overlapping sites of left female breast: Secondary | ICD-10-CM | POA: Diagnosis not present

## 2023-12-01 DIAGNOSIS — R7301 Impaired fasting glucose: Secondary | ICD-10-CM | POA: Diagnosis not present

## 2023-12-01 LAB — CMP (CANCER CENTER ONLY)
ALT: 22 U/L (ref 0–44)
AST: 16 U/L (ref 15–41)
Albumin: 4.4 g/dL (ref 3.5–5.0)
Alkaline Phosphatase: 118 U/L (ref 38–126)
Anion gap: 7 (ref 5–15)
BUN: 21 mg/dL — ABNORMAL HIGH (ref 6–20)
CO2: 25 mmol/L (ref 22–32)
Calcium: 9.5 mg/dL (ref 8.9–10.3)
Chloride: 103 mmol/L (ref 98–111)
Creatinine: 0.89 mg/dL (ref 0.44–1.00)
GFR, Estimated: >60 mL/min (ref >60)
Glucose, Bld: 104 mg/dL — ABNORMAL HIGH (ref 70–99)
Potassium: 4.2 mmol/L (ref 3.5–5.1)
Sodium: 135 mmol/L (ref 135–145)
Total Bilirubin: 0.4 mg/dL (ref 0.0–1.2)
Total Protein: 8 g/dL (ref 6.5–8.1)

## 2023-12-01 LAB — CBC WITH DIFFERENTIAL (CANCER CENTER ONLY)
Abs Immature Granulocytes: 0.02 10*3/uL (ref 0.00–0.07)
Basophils Absolute: 0 10*3/uL (ref 0.0–0.1)
Basophils Relative: 1 %
Eosinophils Absolute: 0.1 10*3/uL (ref 0.0–0.5)
Eosinophils Relative: 1 %
HCT: 38 % (ref 36.0–46.0)
Hemoglobin: 12.6 g/dL (ref 12.0–15.0)
Immature Granulocytes: 0 %
Lymphocytes Relative: 25 %
Lymphs Abs: 1.4 10*3/uL (ref 0.7–4.0)
MCH: 27.3 pg (ref 26.0–34.0)
MCHC: 33.2 g/dL (ref 30.0–36.0)
MCV: 82.4 fL (ref 80.0–100.0)
Monocytes Absolute: 0.5 10*3/uL (ref 0.1–1.0)
Monocytes Relative: 10 %
Neutro Abs: 3.6 10*3/uL (ref 1.7–7.7)
Neutrophils Relative %: 63 %
Platelet Count: 225 10*3/uL (ref 150–400)
RBC: 4.61 MIL/uL (ref 3.87–5.11)
RDW: 14.5 % (ref 11.5–15.5)
WBC Count: 5.7 10*3/uL (ref 4.0–10.5)
nRBC: 0 % (ref 0.0–0.2)

## 2023-12-01 MED ORDER — METOPROLOL SUCCINATE ER 50 MG PO TB24
25.0000 mg | ORAL_TABLET | Freq: Every day | ORAL | 0 refills | Status: AC
  Filled 2024-04-02: qty 45, 90d supply, fill #0
  Filled 2024-06-27: qty 45, 90d supply, fill #1

## 2023-12-01 MED ORDER — OMEPRAZOLE 40 MG PO CPDR
40.0000 mg | DELAYED_RELEASE_CAPSULE | Freq: Every day | ORAL | 0 refills | Status: DC
  Filled 2024-04-02: qty 90, 90d supply, fill #0

## 2023-12-01 MED ORDER — ROSUVASTATIN CALCIUM 20 MG PO TABS
20.0000 mg | ORAL_TABLET | Freq: Every day | ORAL | 0 refills | Status: DC
  Filled 2023-12-01 (×2): qty 90, 90d supply, fill #0

## 2023-12-01 MED ORDER — CYCLOBENZAPRINE HCL 10 MG PO TABS
5.0000 mg | ORAL_TABLET | Freq: Two times a day (BID) | ORAL | 0 refills | Status: AC | PRN
  Filled 2023-12-01 (×2): qty 90, 90d supply, fill #0

## 2023-12-01 MED ORDER — LOSARTAN POTASSIUM 50 MG PO TABS
50.0000 mg | ORAL_TABLET | Freq: Every day | ORAL | 0 refills | Status: DC
  Filled 2024-02-05: qty 90, 90d supply, fill #0

## 2023-12-01 MED ORDER — SPIRONOLACTONE 25 MG PO TABS
25.0000 mg | ORAL_TABLET | Freq: Every day | ORAL | 0 refills | Status: DC
  Filled 2023-12-01 – 2023-12-03 (×4): qty 90, 90d supply, fill #0

## 2023-12-01 NOTE — Progress Notes (Signed)
 Patient Care Team: [name], MD as PCP - General (Family Medicine) Shlomo Wilbert SAUNDERS, MD as PCP - Cardiology (Cardiology) Vanderbilt Ned, MD as Consulting Physician (General Surgery) Dewey Rush, MD as Consulting Physician (Radiation Oncology) Dillingham, Estefana RAMAN, DO as Attending Physician (Plastic Surgery) Jeanna Leatrice BROCKS, RN as Registered Nurse Tionna Gigante, MD as Consulting Physician (Hematology and Oncology)  DIAGNOSIS:  No diagnosis found.   SUMMARY OF ONCOLOGIC HISTORY: Oncology History  Malignant 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, estrogen receptor positive (HCC)  01/23/2017 Initial Diagnosis   Malignant 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, estrogen receptor positive (HCC)   01/27/2017 Cancer Staging   Staging form: Breast, AJCC 8th Edition - Clinical: Stage IIA (cT3, cN2(f), cM0, G2, ER+, PR+, HER2-) - Signed by [name], MD on 06/27/2020   03/24/2017 Genetic Testing   Patient had genetic testing due to a personal and family history of breast cancer.  The Common Hereditary Cancer Panel was ordered.  The Hereditary Gene Panel offered by [facility] includes sequencing and/or deletion duplication testing of the following 46 genes: APC, ATM, AXIN2, BARD1, BMPR1A, BRCA1, BRCA2, BRIP1, CDH1, CDKN2A (p14ARF), CDKN2A (p16INK4a), CHEK2, CTNNA1, DICER1, EPCAM (Deletion/duplication testing only), GREM1 (promoter region deletion/duplication testing only), KIT, MEN1, MLH1, MSH2, MSH3, MSH6, MUTYH, NBN, NF1, NHTL1, PALB2, PDGFRA, PMS2, POLD1, POLE, PTEN, RAD50, RAD51C, RAD51D, SDHB, SDHC, SDHD, SMAD4, SMARCA4. STK11, TP53, TSC1, TSC2, and VHL.  The following genes were evaluated for sequence changes only: SDHA and HOXB13 c.251G>A variant only.     Results: Negative, no pathogenic variants identified.  The date of this test report is 03/24/2017.     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    Malignant neoplasm of upper-inner quadrant of left breast in female, estrogen receptor positive (HCC)  07/27/2017 Initial Diagnosis   Malignant neoplasm of upper-inner quadrant of left breast in female, estrogen receptor positive (HCC)   05/31/2018 - 03/04/2021 Chemotherapy   The patient had Investigational ribociclib  (KISQALI) 200 MG tablet [name], 400 mg, Oral, Daily, 11 of 16 cycles  for chemotherapy treatment.      CHIEF COMPLIANT: Follow-up letrozole   INTERVAL HISTORY:   Discussed the use of AI scribe software for clinical note transcription with the patient, who gave verbal consent to proceed.  History of Present Illness    The patient, with a history of left modified radical mastectomy and right breast reduction, presents for an end of five year trial visit.   History of Present Illness Mackenzie [name] is a 53 year old female with breast cancer who presents for follow-up after completing five years in a clinical trial.  She is attending her first follow-up visit after completing five years in a clinical trial for breast cancer. She continues to participate in the trial, which mandates follow-up every six months. She  was previously determined to be postmenopausal in December. She remains on letrozole , which she tolerates well without any reported issues.  She experiences tendonitis in  her right Achilles tendon and has been using a boot for support. She finds the boot inconvenient, particularly when driving, as it requires removal. Inconsistent use has exacerbated her symptoms, but she is making efforts to wear it more regularly. 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  Her medication regimen includes metformin , which was switched from regular to extended-release (XL), and Wellbutrin , initiated to suppress appetite. She has been unable to obtain Wegovy  due to insurance constraints, though it remains on her medication list.  No new medications or significant changes in her health status since the last visit.    ALLERGIES:  is allergic to dilaudid  [hydromorphone ].  MEDICATIONS:  Current Outpatient Medications  Medication Sig Dispense Refill   acetaminophen  (TYLENOL ) 500 MG tablet Take 1,000 mg by mouth every 6 (six) hours as needed (for pain/headaches.).      Ascorbic Acid (VITAMIN C PO) Take by mouth daily.     buPROPion  (WELLBUTRIN  SR) 200 MG 12 hr tablet Take 1 tablet (200 mg total) by mouth daily. 90 tablet 0   Calcium -Phosphorus-Vitamin D  (VITAMIN D3/CALCIUM /PHOSPHORUS PO) Take 600 mg by mouth daily.     cetirizine (ZYRTEC) 10 MG tablet Take 10 mg by mouth daily.     cyclobenzaprine  (FLEXERIL ) 10 MG tablet Take 1/2 tablet (5 mg total) by mouth 2 (two) times daily as needed. 30 tablet 0   diclofenac  (VOLTAREN ) 75 MG EC tablet Take 1 tablet (75 mg total) by mouth 2 (two) times daily as needed. 60 tablet 0   gabapentin  (NEURONTIN ) 300 MG capsule Take 1 capsule (300 mg total) by mouth 3 (three) times daily. 270 capsule 0   letrozole  (FEMARA ) 2.5 MG tablet Take 1 tablet (2.5 mg total) by mouth daily. 90 tablet 4   loratadine  (CLARITIN ) 10 MG tablet Take 1 tablet (10 mg total) by mouth daily. (Patient taking differently: Take 10 mg by mouth daily as needed for allergies.) 90 tablet 2   losartan  (COZAAR ) 50 MG  tablet Take 1 tablet (50 mg total) by mouth daily. 90 tablet 0   metFORMIN  (GLUCOPHAGE -XR) 500 MG 24 hr tablet Take 1 tablet (500 mg total) by mouth 2 (two) times daily with a meal. 180 tablet 0   methimazole  (TAPAZOLE ) 5 MG tablet Take 1 tablet (5 mg total) by mouth daily in the afternoon. 90 tablet 3   metoprolol  succinate (TOPROL -XL) 50 MG 24 hr tablet Take 1/2 tablet (25 mg total) by mouth daily. 90 tablet 0   montelukast  (SINGULAIR ) 10 MG tablet Take 1 tablet (10 mg total) by mouth at bedtime. 90 tablet 0   Multiple Vitamin (MULTIVITAMIN WITH MINERALS) TABS tablet Take 1 tablet by mouth daily.      Omega-3 Fatty Acids (FISH OIL) 1000 MG CAPS Take 1,000 mg by mouth 2 (two) times daily.      omeprazole  (PRILOSEC) 40 MG capsule Take 1 capsule (40 mg total) by mouth daily 1/2 to 1 hour before morning meal 90 capsule 0   ondansetron  (ZOFRAN ) 4 MG tablet Take 1 tablet (4 mg total) by mouth every 8 (eight) hours as needed for nausea or vomiting. 20 tablet 0   Probiotic Product (PROBIOTIC DAILY PO) Take 1 capsule by mouth 2 (two) times daily.      rosuvastatin  (CRESTOR ) 20 MG tablet Take 1  tablet by mouth daily 90 tablet 0   Semaglutide -Weight Management (WEGOVY ) 0.25 MG/0.5ML SOAJ Inject 0.25 mg into the skin once a week. 2 mL 0   spironolactone  (ALDACTONE ) 25 MG tablet Take 1 tablet (25 mg total) by mouth daily. 90 tablet 0   traMADol  (ULTRAM ) 50 MG tablet Take 1-2 tablets (50-100 mg total) by mouth daily as needed. 30 tablet 0   ZINC OXIDE PO Take 1 tablet by mouth 2 (two) times a day.     No current facility-administered medications for this visit.   Facility-Administered Medications Ordered in Other Visits  Medication Dose Route Frequency Provider Last Rate Last Admin   sodium chloride  flush (NS) 0.9 % injection 10 mL  10 mL Intravenous PRN Magrinat, Sandria BROCKS, MD   10 mL at 03/10/17 1239    PHYSICAL EXAMINATION: ECOG PERFORMANCE STATUS: 1 - Symptomatic but completely ambulatory  Vitals:    12/01/23 0918  BP: 131/74  Pulse: 80  Resp: 18  Temp: 98.4 F (36.9 C)  SpO2: 99%   Filed Weights   12/01/23 0918  Weight: 271 lb 1.6 oz (123 kg)    General: No distress HEENT: no palpable LN Right breast no concerns, left breast post mastectomy and reconstruction Chest: CTA bilaterally Abdomen: Soft, non tender No LE edema.  LABORATORY DATA:  I have reviewed the data as listed    Latest Ref Rng & Units 09/15/2023    9:16 AM 06/09/2023    9:06 AM 06/08/2023   10:36 AM  CMP  Glucose 70 - 99 mg/dL 889  878  896   BUN 6 - 24 mg/dL 15  28  22    Creatinine 0.57 - 1.00 mg/dL 9.11  8.89  8.80   Sodium 134 - 144 mmol/L 139  132  137   Potassium 3.5 - 5.2 mmol/L 4.6  4.0  4.8   Chloride 96 - 106 mmol/L 102  98  97   CO2 20 - 29 mmol/L 23  28  24    Calcium  8.7 - 10.2 mg/dL 9.6  9.5  89.5   Total Protein 6.0 - 8.5 g/dL 7.2  8.5  7.9   Total Bilirubin 0.0 - 1.2 mg/dL 0.3  0.5  0.3   Alkaline Phos 44 - 121 IU/L 139  132  163   AST 0 - 40 IU/L 18  21  21    ALT 0 - 32 IU/L 20  28  31      Lab Results  Component Value Date   WBC 5.7 12/01/2023   HGB 12.6 12/01/2023   HCT 38.0 12/01/2023   MCV 82.4 12/01/2023   PLT 225 12/01/2023   NEUTROABS 3.6 12/01/2023    ASSESSMENT & PLAN:   Assessment and Plan Assessment & Plan Breast cancer follow-up Participating in [place] trial for five and a half years. Letrozole  continued due to lymph node positivity. Mammograms annually. Gardent Reveal blood test recommended biannually. - Continue letrozole  for at least seven years, reassess at that time. - Schedule mammogram for June. - Arrange Gardent Reveal blood test every six months. - Follow up every six months as part of the Natalie trial.  Osteopenia Progressive osteopenia noted, likely exacerbated by letrozole . Further decline anticipated. - Recommend weight-bearing exercises to improve bone density. - Advise intake of vitamin D  and calcium , especially if lactose intolerant. -  Discuss potential use of bisphosphonates, with a referral to a dentist prior to initiation due to possible dental complications.  Type 2 diabetes mellitus Managed with metformin   extended-release. Wellbutrin  used for appetite suppression. Insurance issues with Wegovy . - Continue metformin  extended-release formulation. - Maintain Wellbutrin  for appetite suppression. - Keep Wegovy  on the medication list in case insurance coverage changes.  Achilles tendonitis Chronic right foot Achilles tendonitis, inconsistent boot use causing persistent symptoms. - Encourage consistent use of the boot to relieve pressure on the Achilles tendon.    The patient has a good understanding of the overall plan. she agrees with it. she will call with any problems that may develop before the next visit here. Total time spent: 30 mins including face to face time and time spent for planning, charting and co-ordination of care   Amber Stalls, MD 12/01/23

## 2023-12-01 NOTE — Research (Signed)
 [name] to Evaluate Efficacy and Safety of Ribociclib  With Endocrine Therapy as Adjuvant Treatment in Patients With HR+/HER2- Early Breast Cancer   Follow Up visit #1: [name] arrived today for her scheduled follow up visit on the [facility] protocol.  PROs:  Provided to patient after registration. Collected and reviewed for accuracy and completeness.   Labs: Blood collected for CBC, CMET, and Hormone levels per Dr. Loretha.   Healthcare Resource Utilization: Patient did not have any ED visits or hospitalizations since last study contact. She did not have any visits in our clinic for any issues related to her breast cancer or treatment since her last study visit.   Serious Adverse Events: No SAEs since last study contact.   H & P and Clinical Eval for Recurrence:  See Dr. Walter progress note dated today.   Endocrine Treatment: Patient continues to take letrozole  daily as directed by Dr. Loretha.   Plan: Patient will return to clinic in 6 months for follow up visit which is due 05/23/24 (+/- 3 weeks).   Patient aware that she should contact the clinic at any time if she has any questions or concerns prior to her next visit.  Patient thanked for her ongoing participation in the [place] trial.  Mackenzie [name], BSN, RN, [facility] Nurse II [phone] 12/01/2023

## 2023-12-01 NOTE — Assessment & Plan Note (Addendum)
 53 y.o. [place] woman status post left breast upper inner quadrant biopsy 01/14/2017 for a clinical T3 N2, stage IIA invasive ductal carcinoma, grade 1 or 2, estrogen and progesterone receptor positive, HER-2 nonamplified, with an MIB-1 of 20%.   (1) staging studies: Brain MRI, bone scan, and CT scan of the chest 02/05/2017 showed no brain lesions, no lung or liver lesions, a 4.9 cm mass in the left breast with left axillary and subpectoral adenopathy, and nonspecific bone scan tracer at L2, left scapula, and anterior ribs, with lumbar spine MRI suggested for further evaluation.             (a) lumbar spine MRI 02/17/2017 showed no normal bone lesions.  There was mild lumbar spondylosis   (2) neoadjuvant chemotherapy consisting of cyclophosphamide  and doxorubicin  in dose dense fashion 4 started 02/10/2017, completed 03/24/2017, followed by weekly carboplatin  and gemcitabine  given days 1 and 8 of each 21-day cycle starting 04/14/2017, completing the planned 4 cycles 06/26/2017   (3) is post left modified radical mastectomy on 08/27/2017 showing an mpT3 pN2 residual invasive ductal carcinoma, grade 2, with a residual cancer burden of 3.  Margins were clear             (a) latissimus flap reconstruction with expander 11/03/2018   (4) postmastectomy radiation completed 01/01/2018             (a) capecitabine  radiosensitization 11/16/2017-01/01/2018   (5) goserelin started 09/21/2017             (a) letrozole  started 01/11/2018             (b) enrolled in [name] clinical trial, randomized to ribociclib  on 05/31/2018             (c) Bone density on 03/16/2018 was normal with T score of -0.7 in the L1-L4 spine   (6) genetics testing 03/24/2017 through the Common Hereditary Cancer Panel offered by [facility] found no deleterious mutations in APC, ATM, AXIN2, BARD1, BMPR1A, BRCA1, BRCA2, BRIP1, CDH1, CDKN2A (p14ARF), CDKN2A (p16INK4a), CHEK2, CTNNA1, DICER1, EPCAM (Deletion/duplication testing only), GREM1  (promoter region deletion/duplication testing only), KIT, MEN1, MLH1, MSH2, MSH3, MSH6, MUTYH, NBN, NF1, NHTL1, PALB2, PDGFRA, PMS2, POLD1, POLE, PTEN, RAD50, RAD51C, RAD51D, SDHB, SDHC, SDHD, SMAD4, SMARCA4. STK11, TP53, TSC1, TSC2, and VHL.  The following genes were evaluated for sequence changes only: SDHA and HOXB13 c.251G>A variant only.     (7) Right breast reduction and left breast expander placement on 03/03/2019 with latissimus flap placement             (a) left implant implant exchanged on 05/25/2019 or a [employer] Smooth Round Ultra High Profile Gel 750cc. Ref #[id number].  Serial Number [id number] -------------------------------------------------------------------------------------------------------------------------------------------- Current treatment: Adjuvant letrozole  (completed Ribociclib  as part of [name] trial for 3 years)

## 2023-12-02 ENCOUNTER — Other Ambulatory Visit (HOSPITAL_COMMUNITY): Payer: Self-pay

## 2023-12-02 LAB — FOLLICLE STIMULATING HORMONE: FSH: 21.6 m[IU]/mL

## 2023-12-02 LAB — LUTEINIZING HORMONE: LH: 8.4 m[IU]/mL

## 2023-12-03 ENCOUNTER — Other Ambulatory Visit (HOSPITAL_COMMUNITY): Payer: Self-pay

## 2023-12-04 ENCOUNTER — Telehealth: Payer: Self-pay | Admitting: *Deleted

## 2023-12-04 NOTE — Telephone Encounter (Signed)
 Guardant Reveal order sent for mobile draw

## 2023-12-06 LAB — ESTRADIOL, ULTRA SENS: Estradiol, Sensitive: <5 pg/mL

## 2023-12-07 ENCOUNTER — Ambulatory Visit: Payer: 59 | Admitting: Hematology and Oncology

## 2023-12-08 ENCOUNTER — Other Ambulatory Visit (HOSPITAL_COMMUNITY): Payer: Self-pay

## 2023-12-08 MED ORDER — GABAPENTIN 300 MG PO CAPS
300.0000 mg | ORAL_CAPSULE | Freq: Three times a day (TID) | ORAL | 0 refills | Status: DC
  Filled 2023-12-08: qty 270, 90d supply, fill #0

## 2023-12-09 ENCOUNTER — Other Ambulatory Visit: Payer: Self-pay

## 2023-12-09 ENCOUNTER — Other Ambulatory Visit (HOSPITAL_COMMUNITY): Payer: Self-pay

## 2023-12-09 ENCOUNTER — Ambulatory Visit (INDEPENDENT_AMBULATORY_CARE_PROVIDER_SITE_OTHER): Admitting: Family Medicine

## 2023-12-09 VITALS — BP 139/82 | Ht 66.0 in | Wt 261.0 lb

## 2023-12-09 DIAGNOSIS — M7661 Achilles tendinitis, right leg: Secondary | ICD-10-CM | POA: Diagnosis not present

## 2023-12-09 MED ORDER — NITROGLYCERIN 0.2 MG/HR TD PT24
MEDICATED_PATCH | TRANSDERMAL | 1 refills | Status: AC
  Filled 2023-12-09: qty 30, 90d supply, fill #0
  Filled 2024-03-15: qty 30, 90d supply, fill #1

## 2023-12-09 MED ORDER — MONTELUKAST SODIUM 10 MG PO TABS
10.0000 mg | ORAL_TABLET | Freq: Every day | ORAL | 0 refills | Status: DC
  Filled 2023-12-09: qty 90, 90d supply, fill #0

## 2023-12-09 NOTE — Progress Notes (Signed)
 PCP: [name], MD  Subjective:   HPI: Patient is a 53 y.o. female here for heel pain.  Patient reports she's had posterior right heel pain since about December/January. She reports having a right knee meniscus surgery about a year ago and thinks this may have led her to walk differently and cause this. She had done physical therapy for her achilles along with the knee. No acute injury or trauma. Was placed in a boot which does help when she wears it.  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, left (HCC) 01/23/2017   Cellulitis of right leg    Edema, lower extremity    GERD (gastroesophageal reflux disease)    Healthcare maintenance 03/25/2017   Herpesviral infection    Hot flashes 03/2017   Hyperlipidemia 03/20/2016   Impaired fasting glucose    Morbid obesity with body mass index (BMI) of 40.0 to 49.9 (HCC) 09/17/2017   Neoplasm of cervix    Osteoarthritis of knee    Other chest pain    Other specified disorders of veins    Personal history of chemotherapy    Personal history of radiation therapy    Polyneuropathy    Postmenopausal atrophic vaginitis    Pre-diabetes 07/26/2018   Hgb A1C elevated on 07/26/2018, Gestational Diabetes 2012   Pure hypercholesterolemia 03/30/2023   Seasonal allergies 2012   seasonal allergies causes allergic rhinitis and itchy, dry eyes per pt   Stress incontinence    Tachycardia    Thyrotoxicosis    Vitamin D  deficiency 05/2015    Current Outpatient Medications on File Prior to Visit  Medication Sig Dispense Refill   acetaminophen  (TYLENOL ) 500 MG tablet Take 1,000 mg by mouth every 6 (six) hours as needed (for pain/headaches.).      Ascorbic Acid  (VITAMIN C PO) Take by mouth daily.     buPROPion  (WELLBUTRIN  SR) 200 MG 12 hr tablet Take 1 tablet (200 mg total) by mouth daily. 90 tablet 0   Calcium -Phosphorus-Vitamin D  (VITAMIN D3/CALCIUM /PHOSPHORUS PO) Take 600 mg by mouth daily.     cetirizine (ZYRTEC) 10 MG tablet Take 10 mg by mouth daily.     cyclobenzaprine  (FLEXERIL ) 10 MG tablet Take 1/2 tablet (5 mg total) by mouth 2 (two) times daily as needed. 30 tablet 0   cyclobenzaprine  (FLEXERIL ) 10 MG tablet Take 1/2 tablet (5 mg total) by mouth 2 (two) times daily as needed. 90 tablet 0   diclofenac  (VOLTAREN ) 75 MG EC tablet Take 1 tablet (75 mg total) by mouth 2 (two) times daily as needed. 60 tablet 0   gabapentin  (NEURONTIN ) 300 MG capsule Take 1 capsule (300 mg total) by mouth 3 (three) times daily. 270 capsule 0   letrozole  (FEMARA ) 2.5 MG tablet Take 1 tablet (2.5 mg total) by mouth daily. 90 tablet 4   loratadine  (CLARITIN ) 10 MG tablet Take 1 tablet (10 mg total) by mouth daily. (Patient taking differently: Take 10 mg by mouth daily as needed for allergies.) 90 tablet 2   losartan  (COZAAR ) 50 MG tablet Take 1 tablet (50 mg total) by mouth daily. 90 tablet 0   metFORMIN  (GLUCOPHAGE -XR) 500 MG 24 hr tablet Take 1 tablet (  500 mg total) by mouth 2 (two) times daily with a meal. 180 tablet 0   methimazole  (TAPAZOLE ) 5 MG tablet Take 1 tablet (5 mg total) by mouth daily in the afternoon. 90 tablet 3   metoprolol  succinate (TOPROL -XL) 50 MG 24 hr tablet Take 1/2 tablet (25 mg total) by mouth daily. 90 tablet 0   Multiple Vitamin (MULTIVITAMIN WITH MINERALS) TABS tablet Take 1 tablet by mouth daily.      Omega-3 Fatty Acids (FISH OIL) 1000 MG CAPS Take 1,000 mg by mouth 2 (two) times daily.      omeprazole  (PRILOSEC) 40 MG capsule Take 1 capsule (40 mg total) by mouth daily 1/2 to 1 hour before morning meal. 90 capsule 0   ondansetron  (ZOFRAN ) 4 MG tablet Take 1 tablet (4 mg total) by mouth every 8 (eight) hours as needed for nausea or  vomiting. 20 tablet 0   Probiotic Product (PROBIOTIC DAILY PO) Take 1 capsule by mouth 2 (two) times daily.      rosuvastatin  (CRESTOR ) 20 MG tablet Take 1 tablet by mouth daily 90 tablet 0   Semaglutide -Weight Management (WEGOVY ) 0.25 MG/0.5ML SOAJ Inject 0.25 mg into the skin once a week. 2 mL 0   spironolactone  (ALDACTONE ) 25 MG tablet Take 1 tablet (25 mg total) by mouth daily. 90 tablet 0   traMADol  (ULTRAM ) 50 MG tablet Take 1-2 tablets (50-100 mg total) by mouth daily as needed. 30 tablet 0   ZINC OXIDE PO Take 1 tablet by mouth 2 (two) times a day.     Current Facility-Administered Medications on File Prior to Visit  Medication Dose Route Frequency Provider Last Rate Last Admin   sodium chloride  flush (NS) 0.9 % injection 10 mL  10 mL Intravenous PRN Magrinat, Gustav C, MD   10 mL at 03/10/17 1239    Past Surgical History:  Procedure Laterality Date   BREAST RECONSTRUCTION WITH PLACEMENT OF TISSUE EXPANDER AND FLEX HD (ACELLULAR HYDRATED DERMIS) Left 08/27/2017   Procedure: LEFT BREAST RECONSTRUCTION WITH PLACEMENT OF TISSUE EXPANDER AND FLEX HD;  Surgeon: Lowery Estefana RAMAN, DO;  Location: [facility];  Service: Plastics;  Laterality: Left;   CESAREAN SECTION     x2   KNEE ARTHROSCOPY WITH MEDIAL MENISECTOMY Right 11/12/2022   Procedure: RIGHT KNEE ARTHROSCOPY, PARTIAL MEDIAL MENISCECTOMY;  Surgeon: Jerri Kay HERO, MD;  Location: [facility];  Service: Orthopedics;  Laterality: Right;   LATISSIMUS FLAP TO BREAST Left 11/03/2018   LATISSIMUS FLAP TO BREAST Left 11/03/2018   Procedure: LATISSIMUS FLAP TO LEFT BREAST;  Surgeon: Lowery Estefana RAMAN, DO;  Location: [facility];  Service: Plastics;  Laterality: Left;   MASTECTOMY MODIFIED RADICAL Left 08/27/2017   MASTECTOMY MODIFIED RADICAL Left 08/27/2017   Procedure: LEFT MODIFIED RADICAL MASTECTOMY;  Surgeon: Vanderbilt Ned, MD;  Location: [facility];  Service: General;  Laterality: Left;   MASTOPEXY Right 03/03/2019   Procedure: RIGHT  BREAST MASTOPEXY/REDUCTION;  Surgeon: Lowery Estefana RAMAN, DO;  Location: [facility];  Service: Plastics;  Laterality: Right;  3 hours, please   PORT-A-CATH REMOVAL Right 08/27/2017   Procedure: REMOVAL PORT-A-CATH RIGHT CHEST;  Surgeon: Vanderbilt Ned, MD;  Location: [facility];  Service: General;  Laterality: Right;   PORTACATH PLACEMENT Right 01/28/2017   Procedure: INSERTION PORT-A-CATH WITH ULTRASOUND;  Surgeon: Vanderbilt Ned, MD;  Location: [facility];  Service: General;  Laterality: Right;   REDUCTION MAMMAPLASTY Right    REMOVAL OF TISSUE EXPANDER AND PLACEMENT OF IMPLANT Left  03/03/2019   Procedure: REMOVAL OF TISSUE EXPANDER AND PLACEMENT OF EXPANDER;  Surgeon: Lowery Estefana RAMAN, DO;  Location: [facility];  Service: Plastics;  Laterality: Left;   REMOVAL OF TISSUE EXPANDER AND PLACEMENT OF IMPLANT Left 05/25/2019   Procedure: LEFT BREAST REMOVAL OF TISSUE EXPANDER AND PLACEMENT OF IMPLANT;  Surgeon: Lowery Estefana RAMAN, DO;  Location: [facility];  Service: Plastics;  Laterality: Left;   TISSUE EXPANDER PLACEMENT Left 11/03/2018   Procedure: PLACEMENT OF TISSUE EXPANDER LEFT BREAST;  Surgeon: Lowery Estefana RAMAN, DO;  Location: [facility];  Service: Plastics;  Laterality: Left;  Total case time is 3.5 hours   TUBAL LIGATION Bilateral 01/21/2011    Allergies  Allergen Reactions   Dilaudid  [Hydromorphone ] Itching    BP 139/82   Ht 5' 6 (1.676 m)   Wt 261 lb (118.4 kg)   LMP 11/28/2016   BMI 42.13 kg/m       No data to display              No data to display              Objective:  Physical Exam:  Gen: NAD, comfortable in exam room  Right foot/ankle: Mod pronation long arch.  No other gross deformity, swelling, ecchymoses Full range of motion Tenderness to palpation achilles insertion on calcaneus Negative ant drawer and negative talar tilt.   Negative calcaneal squeeze. NV intact  distally.   Assessment & Plan:  1. Right insertional achilles tendinopathy - home exercises and stretches reviewed.  Heel lifts.  Avoid flat shoes, barefoot walking.  Nitroglycerin patches - discussed risks of headache and skin irritation.  Icing if needed.  Consider shockwave if not improving.  Follow up in 6 weeks.

## 2023-12-09 NOTE — Patient Instructions (Signed)
 You have insertional Achilles Tendinopathy Nitroglycerin patch - 1/4th patch to affected area, change daily in a slightly different spot. Ok to take aleve  or ibuprofen  with food for pain and inflammation though beyond 6 weeks this is likely just for pain. Do home exercises daily as directed. Icing 15 minutes at a time 3-4 times a day. Avoid uneven ground, hills as much as possible. Heel lifts in shoes or shoes with a natural heel lift. Avoid flat shoes, barefoot walking. Consider shockwave therapy if not improving as expected. Follow up in 6 weeks but call me sooner if you have any issues.

## 2023-12-10 ENCOUNTER — Other Ambulatory Visit (HOSPITAL_COMMUNITY): Payer: Self-pay

## 2023-12-14 ENCOUNTER — Other Ambulatory Visit (HOSPITAL_COMMUNITY): Payer: Self-pay

## 2023-12-14 ENCOUNTER — Ambulatory Visit: Payer: 59 | Admitting: Hematology and Oncology

## 2023-12-14 ENCOUNTER — Encounter (INDEPENDENT_AMBULATORY_CARE_PROVIDER_SITE_OTHER): Payer: Self-pay | Admitting: Adult Health

## 2023-12-14 ENCOUNTER — Ambulatory Visit (INDEPENDENT_AMBULATORY_CARE_PROVIDER_SITE_OTHER): Admitting: Adult Health

## 2023-12-14 ENCOUNTER — Other Ambulatory Visit: Payer: 59

## 2023-12-14 VITALS — BP 129/79 | HR 67 | Temp 98.2°F | Ht 65.0 in | Wt 264.0 lb

## 2023-12-14 DIAGNOSIS — I10 Essential (primary) hypertension: Secondary | ICD-10-CM | POA: Diagnosis not present

## 2023-12-14 DIAGNOSIS — F3289 Other specified depressive episodes: Secondary | ICD-10-CM

## 2023-12-14 DIAGNOSIS — M79671 Pain in right foot: Secondary | ICD-10-CM | POA: Diagnosis not present

## 2023-12-14 DIAGNOSIS — Z6841 Body Mass Index (BMI) 40.0 and over, adult: Secondary | ICD-10-CM

## 2023-12-14 DIAGNOSIS — E559 Vitamin D deficiency, unspecified: Secondary | ICD-10-CM | POA: Diagnosis not present

## 2023-12-14 DIAGNOSIS — F5089 Other specified eating disorder: Secondary | ICD-10-CM | POA: Diagnosis not present

## 2023-12-14 DIAGNOSIS — E669 Obesity, unspecified: Secondary | ICD-10-CM

## 2023-12-14 DIAGNOSIS — R7303 Prediabetes: Secondary | ICD-10-CM

## 2023-12-14 MED ORDER — BUPROPION HCL ER (SR) 200 MG PO TB12
200.0000 mg | ORAL_TABLET | Freq: Every day | ORAL | 0 refills | Status: DC
  Filled 2023-12-14: qty 90, 90d supply, fill #0

## 2023-12-14 MED ORDER — METFORMIN HCL ER 500 MG PO TB24
500.0000 mg | ORAL_TABLET | Freq: Two times a day (BID) | ORAL | 0 refills | Status: DC
  Filled 2023-12-14: qty 180, 90d supply, fill #0

## 2023-12-14 NOTE — Progress Notes (Signed)
 WEIGHT SUMMARY AND BIOMETRICS  Vitals Temp: 98.2 F (36.8 C) BP: 129/79 Pulse Rate: 67 SpO2: 98 %   Anthropometric Measurements Height: 5' (1.524 m) Weight: 264 lb (119.7 kg) BMI (Calculated): 51.56 Weight at Last Visit: 261lb Weight Lost Since Last Visit: 0 Weight Gained Since Last Visit: 3lb Starting Weight: 267lb Total Weight Loss (lbs): 3 lb (1.361 kg)   Body Composition  Body Fat %: 54.4 % Fat Mass (lbs): 143.8 lbs Muscle Mass (lbs): 114.6 lbs Total Body Water (lbs): 85.4 lbs Visceral Fat Rating : 21   Other Clinical Data Fasting: yes Labs: no Today's Visit #: 62 Starting Date: 08/09/19    Chief Complaint:   OBESITY Mackenzie [name] is here to discuss her progress with her obesity treatment plan.  She is on the the Category 3 Plan and states she is following her eating plan approximately 65 % of the time.  She states she is exercising: NEAT Activities  Interim History:  June 2025- chronic follow-up with Oncology- reviewed recent lab results.  She has chronic f/u with Endocrinology/Dr. Sam - 01/13/2024  Exercise-NEAT Activities  Wegovy  Rx denies by insurance- discussed Corporate investment [employer] Option for Zepbound therapy  Subjective:   1. Pain of right heel 12/09/2023 [facility] OV notes: HPI: Patient is a 53 y.o. female here for heel pain.   Patient reports she's had posterior right heel pain since about December/January. She reports having a right knee meniscus surgery about a year ago and thinks this may have led her to walk differently and cause this. She had done physical therapy for her achilles along with the knee. No acute injury or trauma. Was placed in a boot which does help when she wears it. Assessment & Plan:  1. Right insertional achilles tendinopathy - home exercises and stretches reviewed.   Heel lifts.   Avoid flat shoes, barefoot walking.   Nitroglycerin  patches - discussed risks of headache and skin irritation.  Icing if  needed.   Consider shockwave if not improving.  Follow up in 6 weeks.  2. Pre-diabetes Lab Results  Component Value Date   HGBA1C 5.9 (H) 09/15/2023   HGBA1C 6.1 (H) 06/08/2023   HGBA1C 6.0 (H) 01/19/2023    Wegovy - denied by insurance She has remained on Metformin  XR 500mg  BID with meals- she denies GI upset  3. Essential hypertension BP stable at OV She denies tobaccoa/vape use She has been unable to exercise consistently due to chronic lower extremity pain  4. Vitamin D  deficiency She is on daily  Calcium -Phosphorus-Vitamin D  (VITAMIN D3/CALCIUM /PHOSPHORUS PO) Take 600 mg by mouth daily.  She is on daily OTC MVI  5. Emotional Eating Behavior She endorses stable mood, denies SI/HI  Assessment/Plan:   1. Pain of right heel Start Water Walking 2 x week, at least 15 mins in duration  2. Pre-diabetes (Primary) Refill metFORMIN  (GLUCOPHAGE -XR) 500 MG 24 hr tablet Take 1 tablet (500 mg total) by mouth 2 (two) times daily with a meal. Dispense: 180 tablet, Refills: 0 of 0 remaining   3. Essential hypertension Limit Na+  Increase cardiovascular exercise, ie: Water Walking  4. Vitamin D  deficiency Continue current supplementation  5. Emotional Eating Behavior Refill - buPROPion  (WELLBUTRIN  SR) 200 MG 12 hr tablet; Take 1 tablet (200 mg total) by mouth daily.  Dispense: 90 tablet; Refill: 0  6. Obesity, Current BMI 51.56  Mackenzie [name] is not currently in the action stage of change. As such, her goal is to get back  to weightloss efforts . She has agreed to the Category 3 Plan.   Exercise goals: Water Walking 2 x week, at least 15 mins in duration  Behavioral modification strategies: increasing lean protein intake, decreasing simple carbohydrates, increasing vegetables, increasing water intake, no skipping meals, meal planning and cooking strategies, keeping healthy foods in the home, ways to avoid boredom eating, and planning for success.  Mackenzie [name] has agreed to follow-up with  our clinic in 4 weeks. She was informed of the importance of frequent follow-up visits to maximize her success with intensive lifestyle modifications for her multiple health conditions.   Objective:   Blood pressure 129/79, pulse 67, temperature 98.2 F (36.8 C), height 5' (1.524 m), weight 264 lb (119.7 kg), last menstrual period 11/28/2016, SpO2 98%, unknown if currently breastfeeding. Body mass index is 51.56 kg/m.  General: Cooperative, alert, well developed, in no acute distress. HEENT: Conjunctivae and lids unremarkable. Cardiovascular: Regular rhythm.  Lungs: Normal work of breathing. Neurologic: No focal deficits.   Lab Results  Component Value Date   CREATININE 0.89 12/01/2023   BUN 21 (H) 12/01/2023   NA 135 12/01/2023   K 4.2 12/01/2023   CL 103 12/01/2023   CO2 25 12/01/2023   Lab Results  Component Value Date   ALT 22 12/01/2023   AST 16 12/01/2023   GGT 49 06/09/2023   ALKPHOS 118 12/01/2023   BILITOT 0.4 12/01/2023   Lab Results  Component Value Date   HGBA1C 5.9 (H) 09/15/2023   HGBA1C 6.1 (H) 06/08/2023   HGBA1C 6.0 (H) 01/19/2023   HGBA1C 5.8 (H) 09/03/2022   HGBA1C 5.4 01/29/2022   Lab Results  Component Value Date   INSULIN  14.7 09/15/2023   INSULIN  24.1 06/08/2023   INSULIN  15.4 01/19/2023   INSULIN  13.1 09/03/2022   INSULIN  21.3 01/29/2022   Lab Results  Component Value Date   TSH 1.39 07/16/2023   Lab Results  Component Value Date   CHOL 197 06/08/2023   HDL 56 06/08/2023   LDLCALC 124 (H) 06/08/2023   TRIG 93 06/08/2023   CHOLHDL 3.0 03/31/2023   Lab Results  Component Value Date   VD25OH 75.1 09/15/2023   VD25OH 65.8 06/08/2023   VD25OH 76.5 01/19/2023   Lab Results  Component Value Date   WBC 5.7 12/01/2023   HGB 12.6 12/01/2023   HCT 38.0 12/01/2023   MCV 82.4 12/01/2023   PLT 225 12/01/2023   No results found for: IRON, TIBC, FERRITIN  Attestation Statements:   Reviewed by clinician on day of visit:  allergies, medications, problem list, medical history, surgical history, family history, social history, and previous encounter notes.  I have reviewed the above documentation for accuracy and completeness, and I agree with the above. -  Manessa Buley d. Danille Oppedisano, NP-C

## 2023-12-25 ENCOUNTER — Ambulatory Visit (HOSPITAL_COMMUNITY)
Admission: RE | Admit: 2023-12-25 | Discharge: 2023-12-25 | Disposition: A | Discharge status: Home / Self Care | Source: Ambulatory Visit | Attending: Adult Health | Admitting: Adult Health

## 2023-12-25 ENCOUNTER — Encounter: Payer: Self-pay | Admitting: *Deleted

## 2023-12-25 DIAGNOSIS — K760 Fatty (change of) liver, not elsewhere classified: Secondary | ICD-10-CM | POA: Diagnosis not present

## 2023-12-25 DIAGNOSIS — R1011 Right upper quadrant pain: Secondary | ICD-10-CM | POA: Diagnosis not present

## 2023-12-25 DIAGNOSIS — K8020 Calculus of gallbladder without cholecystitis without obstruction: Secondary | ICD-10-CM | POA: Diagnosis not present

## 2023-12-25 NOTE — Progress Notes (Signed)
 MD ordered Guardant Reveal on pt. Patient did not respond to Guardant when they reached out. If pt returns call to set up testing, number is [phone].

## 2023-12-26 ENCOUNTER — Other Ambulatory Visit (HOSPITAL_COMMUNITY): Payer: Self-pay

## 2023-12-30 ENCOUNTER — Ambulatory Visit: Admitting: Family Medicine

## 2023-12-30 VITALS — BP 138/60 | Ht 66.0 in | Wt 260.0 lb

## 2023-12-30 DIAGNOSIS — M7661 Achilles tendinitis, right leg: Secondary | ICD-10-CM | POA: Diagnosis not present

## 2023-12-30 NOTE — Progress Notes (Addendum)
 PCP: [name], MD  Subjective:   HPI: Patient is a 53 y.o. female here for follow-up of her right insertional Achilles tendinopathy.  3 weeks ago her regimen included nitroglycerin  patches, anti-inflammatories as needed, home exercises, icing.  She has been doing all of these things as well as adding a boot that she wears periodically.  She feels that the boot is the most helpful.  She feels like she is 0% better compared to 3 weeks ago.  She notices pain anytime she is walking, driving or putting her foot into dorsiflexion.  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, left (HCC) 01/23/2017   Cellulitis of right leg    Edema, lower extremity    GERD (gastroesophageal reflux disease)    Healthcare maintenance 03/25/2017   Herpesviral infection    Hot flashes 03/2017   Hyperlipidemia 03/20/2016   Impaired fasting glucose    Morbid obesity with body mass index (BMI) of 40.0 to 49.9 (HCC) 09/17/2017   Neoplasm of cervix    Osteoarthritis of knee    Other chest pain    Other specified disorders of veins    Personal history of chemotherapy    Personal history of radiation therapy    Polyneuropathy    Postmenopausal atrophic vaginitis    Pre-diabetes 07/26/2018   Hgb A1C elevated on 07/26/2018, Gestational Diabetes 2012   Pure hypercholesterolemia 03/30/2023   Seasonal allergies 2012   seasonal allergies causes allergic rhinitis and itchy, dry eyes per pt   Stress incontinence    Tachycardia    Thyrotoxicosis    Vitamin D  deficiency 05/2015    Current Outpatient Medications on File Prior to Visit  Medication Sig Dispense Refill   acetaminophen  (TYLENOL ) 500 MG tablet Take 1,000 mg by mouth every 6  (six) hours as needed (for pain/headaches.).      Ascorbic Acid (VITAMIN C PO) Take by mouth daily.     buPROPion  (WELLBUTRIN  SR) 200 MG 12 hr tablet Take 1 tablet (200 mg total) by mouth daily. 90 tablet 0   Calcium -Phosphorus-Vitamin D  (VITAMIN D3/CALCIUM /PHOSPHORUS PO) Take 600 mg by mouth daily.     cetirizine (ZYRTEC) 10 MG tablet Take 10 mg by mouth daily.     cyclobenzaprine  (FLEXERIL ) 10 MG tablet Take 1/2 tablet (5 mg total) by mouth 2 (two) times daily as needed. 30 tablet 0   cyclobenzaprine  (FLEXERIL ) 10 MG tablet Take 1/2 tablet (5 mg total) by mouth 2 (two) times daily as needed. 90 tablet 0   diclofenac  (VOLTAREN ) 75 MG EC tablet Take 1 tablet (75 mg total) by mouth 2 (two) times daily as needed. 60 tablet 0   gabapentin  (NEURONTIN ) 300 MG capsule Take 1 capsule (300 mg total) by mouth 3 (three) times daily. 270 capsule 0   letrozole  (FEMARA ) 2.5 MG tablet Take 1 tablet (2.5 mg total) by mouth daily. 90 tablet 4   loratadine  (CLARITIN ) 10 MG tablet Take 1 tablet (10 mg total) by mouth daily. (Patient taking differently: Take 10 mg by mouth daily as needed for allergies.) 90 tablet 2   losartan  (COZAAR ) 50 MG tablet Take 1 tablet (50 mg total) by mouth daily. 90 tablet 0  metFORMIN  (GLUCOPHAGE -XR) 500 MG 24 hr tablet Take 1 tablet (500 mg total) by mouth 2 (two) times daily with a meal. 180 tablet 0   methimazole  (TAPAZOLE ) 5 MG tablet Take 1 tablet (5 mg total) by mouth daily in the afternoon. 90 tablet 3   metoprolol  succinate (TOPROL -XL) 50 MG 24 hr tablet Take 1/2 tablet (25 mg total) by mouth daily. 90 tablet 0   montelukast  (SINGULAIR ) 10 MG tablet Take 1 tablet (10 mg total) by mouth at bedtime. 90 tablet 0   Multiple Vitamin (MULTIVITAMIN WITH MINERALS) TABS tablet Take 1 tablet by mouth daily.      nitroGLYCERIN  (NITRODUR - DOSED IN MG/24 HR) 0.2 mg/hr patch Apply 1/4th patch to affected achilles, change daily 30 patch 1   Omega-3 Fatty Acids (FISH OIL) 1000 MG CAPS Take  1,000 mg by mouth 2 (two) times daily.      omeprazole  (PRILOSEC) 40 MG capsule Take 1 capsule (40 mg total) by mouth daily 1/2 to 1 hour before morning meal. 90 capsule 0   ondansetron  (ZOFRAN ) 4 MG tablet Take 1 tablet (4 mg total) by mouth every 8 (eight) hours as needed for nausea or vomiting. 20 tablet 0   Probiotic Product (PROBIOTIC DAILY PO) Take 1 capsule by mouth 2 (two) times daily.      rosuvastatin  (CRESTOR ) 20 MG tablet Take 1 tablet by mouth daily 90 tablet 0   spironolactone  (ALDACTONE ) 25 MG tablet Take 1 tablet (25 mg total) by mouth daily. 90 tablet 0   traMADol  (ULTRAM ) 50 MG tablet Take 1-2 tablets (50-100 mg total) by mouth daily as needed. 30 tablet 0   ZINC OXIDE PO Take 1 tablet by mouth 2 (two) times a day.     Current Facility-Administered Medications on File Prior to Visit  Medication Dose Route Frequency Provider Last Rate Last Admin   sodium chloride  flush (NS) 0.9 % injection 10 mL  10 mL Intravenous PRN Magrinat, Gustav C, MD   10 mL at 03/10/17 1239    BP 138/60   Ht 5' 6 (1.676 m)   Wt 260 lb (117.9 kg)   LMP 11/28/2016   BMI 41.97 kg/m       Objective:  Physical Exam:  Gen: NAD, comfortable in exam room Right foot/ankle: Inspection: No gross deformity, erythema or edema Palpation: Pain to palpation over the insertion of the Achilles ROM: Full plantarflexion, limited dorsiflexion due to pain at the posterior Achilles Special test: Negative anterior drawer, negative talar tilt, negative heel squeeze, negative calf squeeze, Thompson negative Neuro: Sensation equal bilaterally, pulses equal bilaterally   Assessment & Plan:  1.  Right insertional Achilles tendinopathy Discussed many treatment options moving forward with patient including increasing her nitro to half patch today, adding a heel lift to her boot, starting formal PT or ESWT.  Patient wishes to proceed with increasing her nitro, adding a heel lift to her boot and wearing this as needed,  and doing formal PT.  She will wait on the ESWT.  Follow-up in 5-6 weeks or sooner if needed, and if she is not improving we will likely pursue ESWT.  Krystal Lowing, DO Sports Medicine Fellow

## 2023-12-30 NOTE — Patient Instructions (Addendum)
 Start physical therapy and do home exercises on days you don't go to therapy. Increase the nitroglycerin  patch to 1/2 patch, change daily. Wear the boot with heel lift if needed to take pressure off the achilles. Consider shockwave therapy if not improving. Follow up with me in 5-6 weeks but call sooner if you're struggling.

## 2024-01-02 ENCOUNTER — Other Ambulatory Visit: Payer: Self-pay

## 2024-01-02 ENCOUNTER — Encounter: Payer: Self-pay | Admitting: Family Medicine

## 2024-01-02 ENCOUNTER — Encounter: Payer: Self-pay | Admitting: Physical Therapy

## 2024-01-02 ENCOUNTER — Ambulatory Visit: Attending: Student | Admitting: Physical Therapy

## 2024-01-02 DIAGNOSIS — R2689 Other abnormalities of gait and mobility: Secondary | ICD-10-CM | POA: Insufficient documentation

## 2024-01-02 DIAGNOSIS — M25571 Pain in right ankle and joints of right foot: Secondary | ICD-10-CM | POA: Insufficient documentation

## 2024-01-02 DIAGNOSIS — M6281 Muscle weakness (generalized): Secondary | ICD-10-CM | POA: Insufficient documentation

## 2024-01-02 DIAGNOSIS — R600 Localized edema: Secondary | ICD-10-CM | POA: Diagnosis not present

## 2024-01-02 DIAGNOSIS — M7661 Achilles tendinitis, right leg: Secondary | ICD-10-CM | POA: Diagnosis not present

## 2024-01-02 NOTE — Therapy (Signed)
 OUTPATIENT PHYSICAL THERAPY LOWER EXTREMITY EVALUATION  Patient Name: Mackenzie [name] MRN: [id number] DOB:1971-01-11, 53 y.o., female Today's Date: 01/02/2024   PT End of Session - 01/02/24 1152     Visit Number 1    Number of Visits --   1-2x/week   Date for PT Re-Evaluation 02/27/24    Authorization Type [employer] [facility]    PT Start Time 1115    PT Stop Time 1152    PT Time Calculation (min) 37 min          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, left (HCC) 01/23/2017   Cellulitis of right leg    Edema, lower extremity    GERD (gastroesophageal reflux disease)    Healthcare maintenance 03/25/2017   Herpesviral infection    Hot flashes 03/2017   Hyperlipidemia 03/20/2016   Impaired fasting glucose    Morbid obesity with body mass index (BMI) of 40.0 to 49.9 (HCC) 09/17/2017   Neoplasm of cervix    Osteoarthritis of knee    Other chest pain    Other specified disorders of veins    Personal history of chemotherapy    Personal history of radiation therapy    Polyneuropathy    Postmenopausal atrophic vaginitis    Pre-diabetes 07/26/2018   Hgb A1C elevated on 07/26/2018, Gestational Diabetes 2012   Pure hypercholesterolemia 03/30/2023   Seasonal allergies 2012   seasonal allergies causes allergic rhinitis and itchy, dry eyes per pt   Stress incontinence    Tachycardia    Thyrotoxicosis    Vitamin D  deficiency 05/2015   Past Surgical History:  Procedure Laterality Date   BREAST RECONSTRUCTION WITH PLACEMENT OF TISSUE EXPANDER AND FLEX HD (ACELLULAR HYDRATED DERMIS) Left 08/27/2017   Procedure: LEFT BREAST RECONSTRUCTION WITH PLACEMENT OF TISSUE EXPANDER AND FLEX HD;  Surgeon: Lowery Estefana RAMAN, DO;  Location: [facility];  Service: Plastics;  Laterality: Left;   CESAREAN SECTION     x2   KNEE ARTHROSCOPY WITH MEDIAL MENISECTOMY Right 11/12/2022   Procedure: RIGHT KNEE ARTHROSCOPY, PARTIAL MEDIAL MENISCECTOMY;  Surgeon: Jerri Kay HERO, MD;  Location: [facility];  Service: Orthopedics;  Laterality: Right;   LATISSIMUS FLAP TO BREAST Left 11/03/2018   LATISSIMUS FLAP TO BREAST Left 11/03/2018   Procedure: LATISSIMUS FLAP TO LEFT BREAST;  Surgeon: Lowery Estefana RAMAN, DO;  Location: [facility];  Service: Plastics;  Laterality: Left;   MASTECTOMY MODIFIED RADICAL Left 08/27/2017   MASTECTOMY MODIFIED RADICAL Left 08/27/2017   Procedure: LEFT MODIFIED RADICAL MASTECTOMY;  Surgeon: Vanderbilt Ned, MD;  Location: [facility];  Service: General;  Laterality: Left;   MASTOPEXY Right 03/03/2019   Procedure: RIGHT BREAST MASTOPEXY/REDUCTION;  Surgeon: Lowery Estefana RAMAN, DO;  Location: [facility];  Service: Plastics;  Laterality: Right;  3 hours, please   PORT-A-CATH REMOVAL Right 08/27/2017   Procedure: REMOVAL PORT-A-CATH RIGHT CHEST;  Surgeon: Vanderbilt Ned, MD;  Location: [facility];  Service: General;  Laterality: Right;   PORTACATH PLACEMENT Right 01/28/2017   Procedure: INSERTION PORT-A-CATH WITH ULTRASOUND;  Surgeon: Vanderbilt Ned, MD;  Location: [facility];  Service: General;  Laterality: Right;   REDUCTION MAMMAPLASTY Right  REMOVAL OF TISSUE EXPANDER AND PLACEMENT OF IMPLANT Left 03/03/2019   Procedure: REMOVAL OF TISSUE EXPANDER AND PLACEMENT OF EXPANDER;  Surgeon: Lowery Estefana RAMAN, DO;  Location: [facility];  Service: Plastics;  Laterality: Left;   REMOVAL OF TISSUE EXPANDER AND PLACEMENT OF IMPLANT Left 05/25/2019   Procedure: LEFT BREAST REMOVAL OF TISSUE EXPANDER AND PLACEMENT OF IMPLANT;  Surgeon: Lowery Estefana RAMAN, DO;  Location: [facility];  Service: Plastics;  Laterality: Left;   TISSUE EXPANDER PLACEMENT  Left 11/03/2018   Procedure: PLACEMENT OF TISSUE EXPANDER LEFT BREAST;  Surgeon: Lowery Estefana RAMAN, DO;  Location: [facility];  Service: Plastics;  Laterality: Left;  Total case time is 3.5 hours   TUBAL LIGATION Bilateral 01/21/2011   Patient Active Problem List   Diagnosis Date Noted   SOBOE (shortness of breath on exertion) 11/11/2023   Primary osteoarthritis of left knee 10/09/2023   Achilles tendinitis, right leg 10/09/2023   Graves disease 07/16/2023   Multinodular goiter 07/16/2023   Right carpal tunnel syndrome 07/10/2023   Left carpal tunnel syndrome 07/10/2023   Pain of Right Knee 07/06/2023   History of breast reconstruction 05/15/2023   Pure hypercholesterolemia 03/30/2023   Acute meniscal tear, medial, right, initial encounter 11/12/2022   Plica syndrome of right knee 11/12/2022   Chondromalacia, right knee 11/12/2022   Depression 08/13/2022   BMI 40.0-44.9, adult (HCC) 08/13/2022   Obesity, Beginning BMI 08/13/2022   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   Boil of buttock, Left 12/24/2021   Hyperthyroidism 07/22/2021   Low serum thyroid  stimulating hormone (TSH) 06/12/2021   Other fatigue 05/23/2021   Hemoglobin low 05/23/2021   Polyphagia 05/16/2020   Vitamin D  deficiency 05/16/2020   At risk for osteoporosis 05/16/2020   Prediabetes 05/01/2020   Elevated blood pressure reading 05/01/2020   At risk for impaired metabolic function 05/01/2020   Breast asymmetry following reconstructive surgery 09/20/2019   Essential hypertension 08/17/2018   Class 3 severe obesity with serious comorbidity and body mass index (BMI) of 40.0 to 44.9 in adult 09/17/2017   Acquired absence of left breast 09/15/2017   Malignant neoplasm of upper-inner quadrant of left breast in female, 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   Healthcare maintenance 03/25/2017   Malignant 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, estrogen receptor positive (HCC) 01/23/2017    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, left (HCC) 01/23/2017    PCP: [name], MD  REFERRING PROVIDER: Cleatrice Ludie SAUNDERS, MD  THERAPY DIAG:  Pain in right ankle and joints of right foot - Plan: PT plan of care cert/re-cert  Other abnormalities of gait and mobility - Plan: PT plan of care cert/re-cert  Localized edema - Plan: PT plan of care cert/re-cert  Muscle weakness - Plan: PT plan of care cert/re-cert  REFERRING DIAG: Right Achilles tendinitis [M76.61]   Rationale for Evaluation and Treatment:  Rehabilitation  SUBJECTIVE:  PERTINENT PAST HISTORY:  Hx of breast cancer, R knee pain, L knee pain      PRECAUTIONS: None  WEIGHT BEARING RESTRICTIONS No  FALLS:  Has patient fallen in last 6 months? No, Number of falls: 0  MOI/History of condition:  Onset date: January  SUBJECTIVE STATEMENT  Mackenzie [name] is a 53 y.o. female who presents to clinic with chief complaint of R posterior ankle pain.  Started in January with no specific inciting event.  Has slowly worsened since that time.  The has used a boot as  needed, she is using nitroglycerine patches, she ices.  She has some modest improvement but nothing significant.  Attempted eccentrics which were too painful    Pain:  Are you having pain? 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  Education administrator: NA  Hand Dominance: NA  Patient Goals/Specific Activities: improved comfort with walking (10 min currently, was walking up to 3 miles/day), yard work   OBJECTIVE:   DIAGNOSTIC FINDINGS:  None relevant  GENERAL OBSERVATION/GAIT:  Antalgic gait with reduced time in stance on R  PALPATION: TTP insertion of R achilles tendon  LE MMT:  MMT Right (Eval) Left (Eval)  Hip flexion (L2, L3)    Knee  extension (L3) 4 4  Knee flexion 4 4  Hip abduction    Hip extension    Hip external rotation    Hip internal rotation    Hip adduction    Ankle dorsiflexion (L4) 5 5  Ankle plantarflexion (S1) Partial ROM SL eel raise unable  Ankle inversion 4 5  Ankle eversion 4 5  Great Toe ext (L5)    Grossly     (Blank rows = not tested, score listed is out of 5 possible points OR may be listed in lbs of force.  N = WNL, D = diminished, C = clear for gross weakness with myotome testing, * = concordant pain with testing)  LE ROM:  ROM Right (Eval) Left (Eval)  Hip flexion    Hip extension    Hip abduction    Hip adduction    Hip internal rotation    Hip external rotation    Knee extension    Knee flexion    Ankle dorsiflexion 10 10  Ankle plantarflexion 50 50  Ankle inversion 35 35  Ankle eversion 20 20   (Blank rows = not tested, N = WNL, * = concordant pain with testing)  Functional Tests  Eval    Progressive balance screen (highest level completed for >/= 10''):  Feet together: 10'' Semi Tandem: R in rear 10'', L in rear 10'' Tandem: R in rear 4'', L in rear 8'' SLS: R unable, L unable                                                          PATIENT SURVEYS:  LEFS: 33/80  TODAY'S TREATMENT: Therapeutic Exercise: Creating, reviewing, and completing below HEP   PATIENT EDUCATION ([place]/[facility]):  POC, diagnosis, prognosis, HEP, and outcome measures.  Pt educated via explanation, demonstration, and handout (HEP).  Pt confirms understanding verbally.   HOME EXERCISE PROGRAM: Access Code: [email or web address] URL: [email or web address] Date: 01/02/2024 Prepared by: Helene Gasmen  Exercises - Seated Ankle Plantar Flexion with Resistance Loop  - 1-2 x daily - 7 x weekly - 5 reps - 30-45 second hold  Treatment priorities   Eval        Progressive loading as tolerated                                          ASSESSMENT:  CLINICAL  IMPRESSION:  Mackenzie [name] is a 53 y.o. female who presents to clinic with signs and sxs consistent with R posterior heel pain.  Consistent with physician impression of insertional achilles tendinopathy.   She is using a heel lift and nitro patches.  Tried eccentrics but this was too much load and agging.  Started with band isometrics and will progress as able.   Mackenzie [name] will benefit from skilled PT to address relevant deficits and improve comfort in walking and recreational activities.   OBJECTIVE IMPAIRMENTS: Pain, ankle ROM, gait, balance  ACTIVITY LIMITATIONS: walking, standing, housework, stairs  PERSONAL FACTORS: See medical history and pertinent history   REHAB POTENTIAL: Good  CLINICAL DECISION MAKING: Evolving/moderate complexity  EVALUATION COMPLEXITY: Moderate   GOALS:   SHORT TERM GOALS: Target date: 01/30/2024   Mackenzie [name] will be >75% HEP compliant to improve carryover between sessions and facilitate independent management of condition  Evaluation: ongoing Goal status: INITIAL   LONG TERM GOALS: Target date: 02/27/2024   Mackenzie [name] will self report >/= 50% decrease in pain from evaluation to improve function in daily tasks  Evaluation/Baseline: 10/10 max pain Goal status: INITIAL   2.  Mackenzie [name] will show a >/= 20 pt improvement in LEFS score (MCID is ~11% or 9 pts) as a proxy for functional improvement   Evaluation/Baseline: 33/80 pts Goal status: INITIAL   3.  Mackenzie [name] will be able to complete yard work, including mowing her grass, not limited by pain  Evaluation/Baseline: limited Goal status: INITIAL   4.  Mackenzie [name] will be able to walk for 30 min, not limited by pain   Evaluation/Baseline: 10 min Goal status: INITIAL    5.  Mackenzie [name] will be able to perform at least partial ROM SL heel raise on R, not limited by pain  Evaluation/Baseline: unable Goal status: INITIAL    PLAN: PT FREQUENCY: 1-2x/week  PT DURATION: 8 weeks  PLANNED INTERVENTIONS:  [id number]-  PT Re-evaluation, 97110-Therapeutic exercises, 97530- Therapeutic activity, [id number]- Neuromuscular re-education, 97535- Self Care, [id number]- Manual therapy, [id number]- Gait training, [id number]- Aquatic Therapy, [phone]- Electrical stimulation (manual), [id number]- Vasopneumatic device, [id number]- Traction (mechanical), [id number]- Ionotophoresis 4mg /ml Dexamethasone , Taping, Dry Needling, Joint manipulation, and Spinal manipulation.   Mackenzie [name] PT, DPT 01/02/2024, 11:56 AM

## 2024-01-05 NOTE — Therapy (Signed)
 OUTPATIENT PHYSICAL THERAPY LOWER EXTREMITY EVALUATION  Patient Name: Mackenzie [name] MRN: [id number] DOB:1971-03-03, 53 y.o., female Today's Date: 01/06/2024   PT End of Session - 01/06/24 1420     Visit Number 2    Date for PT Re-Evaluation 02/27/24    Authorization Type [employer] [facility]    PT Start Time 1420    PT Stop Time 1505    PT Time Calculation (min) 45 min    Activity Tolerance Patient tolerated treatment well    Behavior During Therapy [facility] for tasks assessed/performed           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, left (HCC) 01/23/2017   Cellulitis of right leg    Edema, lower extremity    GERD (gastroesophageal reflux disease)    Healthcare maintenance 03/25/2017   Herpesviral infection    Hot flashes 03/2017   Hyperlipidemia 03/20/2016   Impaired fasting glucose    Morbid obesity with body mass index (BMI) of 40.0 to 49.9 (HCC) 09/17/2017   Neoplasm of cervix    Osteoarthritis of knee    Other chest pain    Other specified disorders of veins    Personal history of chemotherapy    Personal history of radiation therapy    Polyneuropathy    Postmenopausal atrophic vaginitis    Pre-diabetes 07/26/2018   Hgb A1C elevated on 07/26/2018, Gestational Diabetes 2012   Pure hypercholesterolemia 03/30/2023   Seasonal allergies 2012   seasonal allergies causes allergic rhinitis and itchy, dry eyes per pt   Stress incontinence    Tachycardia    Thyrotoxicosis    Vitamin D  deficiency 05/2015   Past Surgical History:  Procedure Laterality Date   BREAST RECONSTRUCTION WITH PLACEMENT OF TISSUE EXPANDER AND FLEX HD (ACELLULAR HYDRATED DERMIS) Left 08/27/2017   Procedure: LEFT BREAST  RECONSTRUCTION WITH PLACEMENT OF TISSUE EXPANDER AND FLEX HD;  Surgeon: Lowery Estefana RAMAN, DO;  Location: [facility];  Service: Plastics;  Laterality: Left;   CESAREAN SECTION     x2   KNEE ARTHROSCOPY WITH MEDIAL MENISECTOMY Right 11/12/2022   Procedure: RIGHT KNEE ARTHROSCOPY, PARTIAL MEDIAL MENISCECTOMY;  Surgeon: Jerri Kay HERO, MD;  Location: [facility];  Service: Orthopedics;  Laterality: Right;   LATISSIMUS FLAP TO BREAST Left 11/03/2018   LATISSIMUS FLAP TO BREAST Left 11/03/2018   Procedure: LATISSIMUS FLAP TO LEFT BREAST;  Surgeon: Lowery Estefana RAMAN, DO;  Location: [facility];  Service: Plastics;  Laterality: Left;   MASTECTOMY MODIFIED RADICAL Left 08/27/2017   MASTECTOMY MODIFIED RADICAL Left 08/27/2017   Procedure: LEFT MODIFIED RADICAL MASTECTOMY;  Surgeon: Vanderbilt Ned, MD;  Location: [facility];  Service: General;  Laterality: Left;   MASTOPEXY Right 03/03/2019   Procedure: RIGHT BREAST MASTOPEXY/REDUCTION;  Surgeon: Lowery Estefana RAMAN, DO;  Location: [facility];  Service: Plastics;  Laterality: Right;  3 hours, please   PORT-A-CATH REMOVAL Right 08/27/2017   Procedure: REMOVAL PORT-A-CATH RIGHT CHEST;  Surgeon: Vanderbilt Ned, MD;  Location: [facility];  Service: General;  Laterality: Right;   PORTACATH PLACEMENT Right 01/28/2017   Procedure: INSERTION PORT-A-CATH WITH ULTRASOUND;  Surgeon: Vanderbilt Ned, MD;  Location: [facility];  Service: General;  Laterality: Right;   REDUCTION MAMMAPLASTY Right    REMOVAL OF TISSUE EXPANDER AND PLACEMENT OF IMPLANT Left 03/03/2019   Procedure: REMOVAL OF TISSUE EXPANDER AND PLACEMENT OF EXPANDER;  Surgeon: Lowery Estefana RAMAN, DO;  Location: [facility];  Service: Plastics;  Laterality: Left;   REMOVAL OF TISSUE EXPANDER AND PLACEMENT OF IMPLANT Left 05/25/2019   Procedure: LEFT BREAST REMOVAL OF TISSUE EXPANDER AND PLACEMENT OF IMPLANT;  Surgeon: Lowery Estefana RAMAN, DO;  Location: [facility];  Service: Plastics;  Laterality: Left;   TISSUE EXPANDER PLACEMENT Left 11/03/2018   Procedure: PLACEMENT OF TISSUE EXPANDER LEFT BREAST;  Surgeon: Lowery Estefana RAMAN, DO;  Location: [facility];  Service: Plastics;  Laterality: Left;  Total case time is 3.5 hours   TUBAL LIGATION Bilateral 01/21/2011   Patient Active Problem List   Diagnosis Date Noted   SOBOE (shortness of breath on exertion) 11/11/2023   Primary osteoarthritis of left knee 10/09/2023   Achilles tendinitis, right leg 10/09/2023   Graves disease 07/16/2023   Multinodular goiter 07/16/2023   Right carpal tunnel syndrome 07/10/2023   Left carpal tunnel syndrome 07/10/2023   Pain of Right Knee 07/06/2023   History of breast reconstruction 05/15/2023   Pure hypercholesterolemia 03/30/2023   Acute meniscal tear, medial, right, initial encounter 11/12/2022   Plica syndrome of right knee 11/12/2022   Chondromalacia, right knee 11/12/2022   Depression 08/13/2022   BMI 40.0-44.9, adult (HCC) 08/13/2022   Obesity, Beginning BMI 08/13/2022   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   Boil of buttock, Left 12/24/2021   Hyperthyroidism 07/22/2021   Low serum thyroid  stimulating hormone (TSH) 06/12/2021   Other fatigue 05/23/2021   Hemoglobin low 05/23/2021   Polyphagia 05/16/2020   Vitamin D  deficiency 05/16/2020   At risk for osteoporosis 05/16/2020   Prediabetes 05/01/2020   Elevated blood pressure reading 05/01/2020   At risk for impaired metabolic function 05/01/2020   Breast asymmetry following reconstructive surgery 09/20/2019   Essential hypertension 08/17/2018   Class 3 severe obesity with serious comorbidity and body mass index (BMI) of 40.0 to 44.9 in adult 09/17/2017   Acquired absence of left breast 09/15/2017   Malignant neoplasm of upper-inner quadrant of left breast in female, 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   Healthcare maintenance 03/25/2017   Malignant neoplasm of  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, estrogen receptor positive (HCC) 01/23/2017   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 No, Number of falls: 0  MOI/History of condition:  Onset date: January  SUBJECTIVE STATEMENT  01-06-24 Mackenzie [name] enters clinic with  6/10 pain in Right heel.  Pt with nitrglycerin patch applied this morning for 24 hours.  EVAL Mackenzie [name] is a 53 y.o. female who presents to clinic with chief complaint of R posterior ankle pain.  Started in January with no specific inciting event.  Has slowly worsened since  that time.  The has used a boot as needed, she is using nitroglycerine patches, she ices.  She has some modest improvement but nothing significant.  Attempted eccentrics which were too painful    Pain:  Are you having pain? 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  Education administrator: NA  Hand Dominance: NA  Patient Goals/Specific Activities: improved comfort with walking (10 min currently, was walking up to 3 miles/day), yard work   OBJECTIVE:   DIAGNOSTIC FINDINGS:  None relevant  GENERAL OBSERVATION/GAIT:  Antalgic gait with reduced time in stance on R  PALPATION: TTP insertion of R achilles  tendon  LE MMT:  MMT Right (Eval) Left (Eval)  Hip flexion (L2, L3)    Knee extension (L3) 4 4  Knee flexion 4 4  Hip abduction    Hip extension    Hip external rotation    Hip internal rotation    Hip adduction    Ankle dorsiflexion (L4) 5 5  Ankle plantarflexion (S1) Partial ROM SL eel raise unable  Ankle inversion 4 5  Ankle eversion 4 5  Great Toe ext (L5)    Grossly     (Blank rows = not tested, score listed is out of 5 possible points OR may be listed in lbs of force.  N = WNL, D = diminished, C = clear for gross weakness with myotome testing, * = concordant pain with testing)  LE ROM:  ROM Right (Eval) Left (Eval)  Hip flexion    Hip extension    Hip abduction    Hip adduction    Hip internal rotation    Hip external rotation    Knee extension    Knee flexion    Ankle dorsiflexion 10 10  Ankle plantarflexion 50 50  Ankle inversion 35 35  Ankle eversion 20 20   (Blank rows = not tested, N = WNL, * = concordant pain with testing)  Functional Tests  Eval    Progressive balance screen (highest level completed for >/= 10''):  Feet together: 10'' Semi Tandem: R in rear 10'', L in rear 10'' Tandem: R in rear 4'', L in rear 8'' SLS: R unable, L unable                                                          PATIENT SURVEYS:  LEFS: 33/80  TODAY'S TREATMENT:  [facility] Adult PT Treatment:                                                DATE: 01-06-24 Therapeutic Exercise: Seated Ankle Plantar Flexion  with GTB Seated Plantar Fascia Stretch   Seated Arch Lifts  Seated Figure 4 Ankle Inversion with GTB   Clamshell with Resistance  GTB SLS  at counter Seated Heel Raise   Alternate Long Sitting Ankle Inversion withGTB   Pt sometimes with difficult with Right ankle on Left knee Long Sitting Ankle Dorsiflexion with RTB Updated HEP Manual Therapy: STW R plantar fascia R heel pad  taping  Self Care: Timeline of healing.  Progressive overload and  adaptability of tissues Shoe wear and heel cup silicone cups for comfort for prolonged standing    EVAL Therapeutic Exercise: Creating, reviewing, and completing below HEP   PATIENT EDUCATION ([place]/[facility]):  POC, diagnosis, prognosis, HEP, and outcome measures.  Pt educated via explanation, demonstration, and handout (HEP).  Pt confirms understanding verbally.   HOME EXERCISE PROGRAM: Access Code: [email or web address] URL: [email or web address] Date: 01/06/2024 Prepared by: Graydon Dingwall  Program Notes toe Yoga  Exercises - Seated Ankle Plantar Flexion with Resistance Loop  - 1-2 x daily - 7 x weekly - 5 reps - 30-45 second hold - Seated Plantar Fascia Stretch  - 1 x daily - 7 x weekly - 1 sets - 3 reps - Seated Arch Lifts  - 1 x daily - 7 x weekly - 3 sets - 10 reps - 5 hold - Seated Figure 4 Ankle Inversion with Resistance  - 1 x daily - 7 x weekly - 3 sets - 10 reps - Clamshell with Resistance  - 1-15 x daily - 7 x weekly - 2 sets - 10 reps - Single Leg Stance  - 1 x daily - 7 x weekly - 1 sets - 4-5 reps - 30 to 60 seconds hold - Seated Heel Raise  - 1 x daily - 7 x weekly - 3 sets - 10 reps - Long Sitting Ankle Inversion with Resistance  - 1 x daily - 7 x weekly - 3 sets - 10 reps - 5 hold - Long Sitting Ankle Dorsiflexion with Anchored Resistance  - 1 x daily - 7 x weekly - 3 sets - 10 reps - 5 hold Treatment priorities   Eval        Progressive loading as tolerated                                          ASSESSMENT:  CLINICAL IMPRESSION:  Mackenzie [name] enters clinic with 6/10 pain in Right heel.  Pt HEP updated for plantar fascia stretch and strengthening. Pt also educated on timeline of healing and importance of progressive loading for tissue adaptation.  Pt will wear trial of heel fat pad taping for 24 hours and report benefit of using at work. Will continue progressing as pt is able   Mackenzie [name] is a 53 y.o. female who presents to clinic with signs and sxs  consistent with R posterior heel pain.  Consistent with physician impression of insertional achilles tendinopathy.   She is using a heel lift and nitro patches.  Tried eccentrics but this was too much load and agging.  Started with band isometrics and will progress as able.   Mackenzie [name] will benefit from skilled PT to address relevant deficits and improve comfort in walking and recreational activities.   OBJECTIVE IMPAIRMENTS: Pain, ankle ROM, gait, balance  ACTIVITY LIMITATIONS: walking, standing, housework, stairs  PERSONAL FACTORS: See medical history and pertinent history   REHAB POTENTIAL: Good  CLINICAL DECISION MAKING: Evolving/moderate complexity  EVALUATION COMPLEXITY: Moderate   GOALS:   SHORT TERM GOALS: Target date: 01/30/2024   Mackenzie [name] will be >75% HEP compliant to improve carryover between sessions and facilitate independent management of condition  Evaluation: ongoing Goal status: INITIAL   LONG TERM GOALS: Target date: 02/27/2024   Mackenzie [name] will self report >/= 50% decrease in pain from evaluation to improve function  in daily tasks  Evaluation/Baseline: 10/10 max pain Goal status: INITIAL   2.  Mackenzie [name] will show a >/= 20 pt improvement in LEFS score (MCID is ~11% or 9 pts) as a proxy for functional improvement   Evaluation/Baseline: 33/80 pts Goal status: INITIAL   3.  Mackenzie [name] will be able to complete yard work, including mowing her grass, not limited by pain  Evaluation/Baseline: limited Goal status: INITIAL   4.  Mackenzie [name] will be able to walk for 30 min, not limited by pain   Evaluation/Baseline: 10 min Goal status: INITIAL    5.  Mackenzie [name] will be able to perform at least partial ROM SL heel raise on R, not limited by pain  Evaluation/Baseline: unable Goal status: INITIAL    PLAN: PT FREQUENCY: 1-2x/week  PT DURATION: 8 weeks  PLANNED INTERVENTIONS:  [id number]- PT Re-evaluation, 97110-Therapeutic exercises, 97530- Therapeutic activity, [id number]-  Neuromuscular re-education, 97535- Self Care, [id number]- Manual therapy, [id number]- Gait training, [id number]- Aquatic Therapy, [id number]- Electrical stimulation (manual), [id number]- Vasopneumatic device, [id number]- Traction (mechanical), [id number]- Ionotophoresis 4mg /ml Dexamethasone , Taping, Dry Needling, Joint manipulation, and Spinal manipulation.   Graydon Dingwall, PT, ATRIC Certified Exercise Expert for the Aging Adult  01/06/24 3:20 PM Phone: [phone] Fax: [phone]

## 2024-01-06 ENCOUNTER — Encounter: Payer: Self-pay | Admitting: Physical Therapy

## 2024-01-06 ENCOUNTER — Ambulatory Visit: Admitting: Physical Therapy

## 2024-01-06 DIAGNOSIS — M7661 Achilles tendinitis, right leg: Secondary | ICD-10-CM | POA: Diagnosis not present

## 2024-01-06 DIAGNOSIS — R600 Localized edema: Secondary | ICD-10-CM

## 2024-01-06 DIAGNOSIS — R2689 Other abnormalities of gait and mobility: Secondary | ICD-10-CM | POA: Diagnosis not present

## 2024-01-06 DIAGNOSIS — M6281 Muscle weakness (generalized): Secondary | ICD-10-CM | POA: Diagnosis not present

## 2024-01-06 DIAGNOSIS — M25571 Pain in right ankle and joints of right foot: Secondary | ICD-10-CM

## 2024-01-11 ENCOUNTER — Encounter (INDEPENDENT_AMBULATORY_CARE_PROVIDER_SITE_OTHER): Payer: Self-pay | Admitting: Adult Health

## 2024-01-11 ENCOUNTER — Ambulatory Visit (INDEPENDENT_AMBULATORY_CARE_PROVIDER_SITE_OTHER): Admitting: Adult Health

## 2024-01-11 ENCOUNTER — Other Ambulatory Visit (HOSPITAL_COMMUNITY): Payer: Self-pay

## 2024-01-11 ENCOUNTER — Other Ambulatory Visit: Payer: Self-pay

## 2024-01-11 VITALS — BP 114/77 | HR 79 | Temp 98.3°F | Ht 65.0 in | Wt 269.0 lb

## 2024-01-11 DIAGNOSIS — I10 Essential (primary) hypertension: Secondary | ICD-10-CM | POA: Diagnosis not present

## 2024-01-11 DIAGNOSIS — K760 Fatty (change of) liver, not elsewhere classified: Secondary | ICD-10-CM | POA: Diagnosis not present

## 2024-01-11 DIAGNOSIS — F5089 Other specified eating disorder: Secondary | ICD-10-CM

## 2024-01-11 DIAGNOSIS — F3289 Other specified depressive episodes: Secondary | ICD-10-CM

## 2024-01-11 DIAGNOSIS — E669 Obesity, unspecified: Secondary | ICD-10-CM | POA: Diagnosis not present

## 2024-01-11 DIAGNOSIS — Z6841 Body Mass Index (BMI) 40.0 and over, adult: Secondary | ICD-10-CM | POA: Diagnosis not present

## 2024-01-11 DIAGNOSIS — E559 Vitamin D deficiency, unspecified: Secondary | ICD-10-CM | POA: Diagnosis not present

## 2024-01-11 DIAGNOSIS — R7303 Prediabetes: Secondary | ICD-10-CM

## 2024-01-11 MED ORDER — METFORMIN HCL ER 500 MG PO TB24
500.0000 mg | ORAL_TABLET | Freq: Two times a day (BID) | ORAL | 0 refills | Status: DC
  Filled 2024-01-11 – 2024-02-26 (×2): qty 180, 90d supply, fill #0

## 2024-01-11 MED ORDER — BUPROPION HCL ER (SR) 200 MG PO TB12
200.0000 mg | ORAL_TABLET | Freq: Every day | ORAL | 0 refills | Status: DC
  Filled 2024-01-11: qty 90, 90d supply, fill #0

## 2024-01-11 NOTE — Progress Notes (Signed)
 WEIGHT SUMMARY AND BIOMETRICS  Vitals Temp: 98.3 F (36.8 C) BP: 114/77 Pulse Rate: 79 SpO2: 99 %   Anthropometric Measurements Height: 5' 5 (1.651 m) Weight: 269 lb (122 kg) BMI (Calculated): 44.76 Weight at Last Visit: 264 lb Weight Lost Since Last Visit: 0 Weight Gained Since Last Visit: 5 lb Starting Weight: 267 lb Total Weight Loss (lbs): 0 lb (0 kg) Peak Weight: 320 lb   Body Composition  Body Fat %: 51.3 % Fat Mass (lbs): 138 lbs Muscle Mass (lbs): 124.4 lbs Total Body Water (lbs): 91 lbs Visceral Fat Rating : 17   Other Clinical Data Fasting: yes Labs: no Today's Visit #: 67 Starting Date: 08/09/19    Chief Complaint:   OBESITY Mackenzie [name] is here to discuss her progress with her obesity treatment plan.  She is on the the Category 3 Plan and states she is following her eating plan approximately 70 % of the time.  She states she is exercising Water Aerobics 45-50 minutes 2 times per week.  Interim History:  Reviewed Bioimpedance results with pt: Muscle Mass: +9.8 lbs Adipose Mass: -5.8 lbs  Exercise-She and her daughter recently started twice weekly Water Aerobics She reports increased stamina since increasing regular cardiovascular exercise  Her 53th birthday was late July- She has been celebrating for 1.5 weeks and her daughter will turn 73 on 01/21/2024  Of Note She is not using cane to ambulate in clinic today!   Subjective:   1. Hepatic steatosis Reviewed Imaging  12/25/2023 Narrative & Impression  CLINICAL DATA:  Right upper quadrant pain and elevated liver enzymes.   EXAM: ULTRASOUND ABDOMEN LIMITED RIGHT UPPER QUADRANT   COMPARISON:  April 04, 2019   FINDINGS: Gallbladder:   A gallstone filled gallbladder is seen. This is present on the prior study. There is no evidence of gallbladder wall thickening (2.8 mm). No sonographic Murphy sign noted by sonographer.   Common bile duct:   Diameter: 2.9 mm   Liver:   No  focal lesion identified. Diffusely increased echogenicity of the liver parenchyma is noted. Portal vein is patent on color Doppler imaging with normal direction of blood flow towards the liver.   Other: None.   IMPRESSION: 1. Cholelithiasis without evidence of acute cholecystitis. 2. Hepatic steatosis.       2. Vitamin D  deficiency She is on daily OTC MVI  3. Pre-diabetes She is on Metformin  XR 500mg  BID with meals- tolerating well She would prefer to resume Wegovy , however injectable AOMs not covered by her insurance  4. Essential hypertension BP excellent at OV She has been increasing cardiovascular exercise- water aerobics with her daughter  5. Emotional Eating Behavior BP/HR at goal She is taking daily Wellbutrin  SR 200mg  She denies hx of seizures She endorses stable appetite, denies SI/HI  Assessment/Plan:   1. Hepatic steatosis Establish with new GI- as referred by PCP MyChart message sent to pt, re: Hepatic Steatosis  2. Vitamin D  deficiency (Primary) Monitor Labs  3. Pre-diabetes Refill metFORMIN  (GLUCOPHAGE -XR) 500 MG 24 hr tablet Take 1 tablet (500 mg total) by mouth 2 (two) times daily with a meal. Dispense: 180 tablet, Refills: 0 of 0 remaining   4. Essential hypertension Limit Na+ Continue to increase regular exercise  5. Emotional Eating Behavior Refill - buPROPion  (WELLBUTRIN  SR) 200 MG 12 hr tablet; Take 1 tablet (200 mg total) by mouth daily.  Dispense: 90 tablet; Refill: 0  6. Obesity, Current BMI 51.1  [name] is currently in the  action stage of change. As such, her goal is to continue with weight loss efforts. She has agreed to the Category 3 Plan.   Exercise goals: All adults should avoid inactivity. Some physical activity is better than none, and adults who participate in any amount of physical activity gain some health benefits. Adults should also include muscle-strengthening activities that involve all major muscle groups on 2 or more days  a week. Water aerobics.  Behavioral modification strategies: increasing lean protein intake, decreasing simple carbohydrates, increasing vegetables, increasing water intake, no skipping meals, meal planning and cooking strategies, keeping healthy foods in the home, ways to avoid boredom eating, celebration eating strategies, and planning for success.  Mackenzie [name] has agreed to follow-up with our clinic in 4 weeks. She was informed of the importance of frequent follow-up visits to maximize her success with intensive lifestyle modifications for her multiple health conditions.   Check Fasting Labs late Summer 2025.  Objective:   Blood pressure 114/77, pulse 79, temperature 98.3 F (36.8 C), height 5' 5 (1.651 m), weight 269 lb (122 kg), last menstrual period 11/28/2016, SpO2 99%, unknown if currently breastfeeding. Body mass index is 44.76 kg/m.  General: Cooperative, alert, well developed, in no acute distress. HEENT: Conjunctivae and lids unremarkable. Cardiovascular: Regular rhythm.  Lungs: Normal work of breathing. Neurologic: No focal deficits.   Lab Results  Component Value Date   CREATININE 0.89 12/01/2023   BUN 21 (H) 12/01/2023   NA 135 12/01/2023   K 4.2 12/01/2023   CL 103 12/01/2023   CO2 25 12/01/2023   Lab Results  Component Value Date   ALT 22 12/01/2023   AST 16 12/01/2023   GGT 49 06/09/2023   ALKPHOS 118 12/01/2023   BILITOT 0.4 12/01/2023   Lab Results  Component Value Date   HGBA1C 5.9 (H) 09/15/2023   HGBA1C 6.1 (H) 06/08/2023   HGBA1C 6.0 (H) 01/19/2023   HGBA1C 5.8 (H) 09/03/2022   HGBA1C 5.4 01/29/2022   Lab Results  Component Value Date   INSULIN  14.7 09/15/2023   INSULIN  24.1 06/08/2023   INSULIN  15.4 01/19/2023   INSULIN  13.1 09/03/2022   INSULIN  21.3 01/29/2022   Lab Results  Component Value Date   TSH 1.39 07/16/2023   Lab Results  Component Value Date   CHOL 197 06/08/2023   HDL 56 06/08/2023   LDLCALC 124 (H) 06/08/2023   TRIG 93  06/08/2023   CHOLHDL 3.0 03/31/2023   Lab Results  Component Value Date   VD25OH 75.1 09/15/2023   VD25OH 65.8 06/08/2023   VD25OH 76.5 01/19/2023   Lab Results  Component Value Date   WBC 5.7 12/01/2023   HGB 12.6 12/01/2023   HCT 38.0 12/01/2023   MCV 82.4 12/01/2023   PLT 225 12/01/2023   No results found for: IRON, TIBC, FERRITIN  Attestation Statements:   Reviewed by clinician on day of visit: allergies, medications, problem list, medical history, surgical history, family history, social history, and previous encounter notes.  I have reviewed the above documentation for accuracy and completeness, and I agree with the above. -  Roselani Grajeda d. Vern Prestia, NP-C

## 2024-01-12 ENCOUNTER — Encounter: Payer: Self-pay | Admitting: Physical Therapy

## 2024-01-13 ENCOUNTER — Other Ambulatory Visit: Payer: Self-pay | Admitting: Physician Assistant

## 2024-01-13 ENCOUNTER — Encounter: Payer: Self-pay | Admitting: Internal Medicine

## 2024-01-13 ENCOUNTER — Ambulatory Visit (INDEPENDENT_AMBULATORY_CARE_PROVIDER_SITE_OTHER): Payer: 59 | Admitting: Internal Medicine

## 2024-01-13 VITALS — BP 110/72 | HR 74 | Ht 65.0 in | Wt 275.0 lb

## 2024-01-13 DIAGNOSIS — E0500 Thyrotoxicosis with diffuse goiter without thyrotoxic crisis or storm: Secondary | ICD-10-CM | POA: Diagnosis not present

## 2024-01-13 DIAGNOSIS — E042 Nontoxic multinodular goiter: Secondary | ICD-10-CM | POA: Diagnosis not present

## 2024-01-13 DIAGNOSIS — E0590 Thyrotoxicosis, unspecified without thyrotoxic crisis or storm: Secondary | ICD-10-CM

## 2024-01-13 NOTE — Therapy (Signed)
 OUTPATIENT PHYSICAL THERAPY LOWER EXTREMITY EVALUATION  Patient Name: Mackenzie [name] MRN: [id number] DOB:1970/11/04, 53 y.o., female Today's Date: 01/14/2024   PT End of Session - 01/14/24 1424     Visit Number 3    Date for PT Re-Evaluation 02/27/24    Authorization Type [employer] [facility]    PT Start Time 1421    PT Stop Time 1502    PT Time Calculation (min) 41 min    Activity Tolerance Patient tolerated treatment well    Behavior During Therapy [facility] for tasks assessed/performed            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, left (HCC) 01/23/2017   Cellulitis of right leg    Edema, lower extremity    GERD (gastroesophageal reflux disease)    Healthcare maintenance 03/25/2017   Herpesviral infection    Hot flashes 03/2017   Hyperlipidemia 03/20/2016   Impaired fasting glucose    Morbid obesity with body mass index (BMI) of 40.0 to 49.9 (HCC) 09/17/2017   Neoplasm of cervix    Osteoarthritis of knee    Other chest pain    Other specified disorders of veins    Personal history of chemotherapy    Personal history of radiation therapy    Polyneuropathy    Postmenopausal atrophic vaginitis    Pre-diabetes 07/26/2018   Hgb A1C elevated on 07/26/2018, Gestational Diabetes 2012   Pure hypercholesterolemia 03/30/2023   Seasonal allergies 2012   seasonal allergies causes allergic rhinitis and itchy, dry eyes per pt   Stress incontinence    Tachycardia    Thyrotoxicosis    Vitamin D  deficiency 05/2015   Past Surgical History:  Procedure Laterality Date   BREAST RECONSTRUCTION WITH PLACEMENT OF TISSUE EXPANDER AND FLEX HD (ACELLULAR HYDRATED DERMIS) Left 08/27/2017   Procedure: LEFT BREAST  RECONSTRUCTION WITH PLACEMENT OF TISSUE EXPANDER AND FLEX HD;  Surgeon: Lowery Estefana RAMAN, DO;  Location: [facility];  Service: Plastics;  Laterality: Left;   CESAREAN SECTION     x2   KNEE ARTHROSCOPY WITH MEDIAL MENISECTOMY Right 11/12/2022   Procedure: RIGHT KNEE ARTHROSCOPY, PARTIAL MEDIAL MENISCECTOMY;  Surgeon: Jerri Kay HERO, MD;  Location: [facility];  Service: Orthopedics;  Laterality: Right;   LATISSIMUS FLAP TO BREAST Left 11/03/2018   LATISSIMUS FLAP TO BREAST Left 11/03/2018   Procedure: LATISSIMUS FLAP TO LEFT BREAST;  Surgeon: Lowery Estefana RAMAN, DO;  Location: [facility];  Service: Plastics;  Laterality: Left;   MASTECTOMY MODIFIED RADICAL Left 08/27/2017   MASTECTOMY MODIFIED RADICAL Left 08/27/2017   Procedure: LEFT MODIFIED RADICAL MASTECTOMY;  Surgeon: Vanderbilt Ned, MD;  Location: [facility];  Service: General;  Laterality: Left;   MASTOPEXY Right 03/03/2019   Procedure: RIGHT BREAST MASTOPEXY/REDUCTION;  Surgeon: Lowery Estefana RAMAN, DO;  Location: [facility];  Service: Plastics;  Laterality: Right;  3 hours, please   PORT-A-CATH REMOVAL Right 08/27/2017   Procedure: REMOVAL PORT-A-CATH RIGHT CHEST;  Surgeon: Vanderbilt Ned, MD;  Location: [facility];  Service: General;  Laterality: Right;   PORTACATH PLACEMENT Right 01/28/2017   Procedure: INSERTION PORT-A-CATH WITH ULTRASOUND;  Surgeon: Vanderbilt Ned, MD;  Location: [facility];  Service: General;  Laterality: Right;   REDUCTION MAMMAPLASTY Right    REMOVAL OF TISSUE EXPANDER AND PLACEMENT OF IMPLANT Left 03/03/2019   Procedure: REMOVAL OF TISSUE EXPANDER AND PLACEMENT OF EXPANDER;  Surgeon: Lowery Estefana RAMAN, DO;  Location: [facility];  Service: Plastics;  Laterality: Left;   REMOVAL OF TISSUE EXPANDER AND PLACEMENT OF IMPLANT Left 05/25/2019   Procedure: LEFT BREAST REMOVAL OF TISSUE EXPANDER AND PLACEMENT OF IMPLANT;  Surgeon: Lowery Estefana RAMAN, DO;  Location: [facility];  Service: Plastics;  Laterality: Left;   TISSUE EXPANDER PLACEMENT Left 11/03/2018   Procedure: PLACEMENT OF TISSUE EXPANDER LEFT BREAST;  Surgeon: Lowery Estefana RAMAN, DO;  Location: [facility];  Service: Plastics;  Laterality: Left;  Total case time is 3.5 hours   TUBAL LIGATION Bilateral 01/21/2011   Patient Active Problem List   Diagnosis Date Noted   SOBOE (shortness of breath on exertion) 11/11/2023   Primary osteoarthritis of left knee 10/09/2023   Achilles tendinitis, right leg 10/09/2023   Graves disease 07/16/2023   Multinodular goiter 07/16/2023   Right carpal tunnel syndrome 07/10/2023   Left carpal tunnel syndrome 07/10/2023   Pain of Right Knee 07/06/2023   History of breast reconstruction 05/15/2023   Pure hypercholesterolemia 03/30/2023   Acute meniscal tear, medial, right, initial encounter 11/12/2022   Plica syndrome of right knee 11/12/2022   Chondromalacia, right knee 11/12/2022   Depression 08/13/2022   BMI 40.0-44.9, adult (HCC) 08/13/2022   Obesity, Beginning BMI 08/13/2022   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   Boil of buttock, Left 12/24/2021   Hyperthyroidism 07/22/2021   Low serum thyroid  stimulating hormone (TSH) 06/12/2021   Other fatigue 05/23/2021   Hemoglobin low 05/23/2021   Polyphagia 05/16/2020   Vitamin D  deficiency 05/16/2020   At risk for osteoporosis 05/16/2020   Prediabetes 05/01/2020   Elevated blood pressure reading 05/01/2020   At risk for impaired metabolic function 05/01/2020   Breast asymmetry following reconstructive surgery 09/20/2019   Essential hypertension 08/17/2018   Class 3 severe obesity with serious comorbidity and body mass index (BMI) of 40.0 to 44.9 in adult 09/17/2017   Acquired absence of left breast 09/15/2017   Malignant neoplasm of upper-inner quadrant of left breast in female, 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   Healthcare maintenance 03/25/2017   Malignant neoplasm of  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, estrogen receptor positive (HCC) 01/23/2017   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 No, Number of falls: 0  MOI/History of condition:  Onset date: January  SUBJECTIVE STATEMENT  01-14-24 Kathlynn enters clinic with  8/10 pain in Right heel. Heel pad taping worked well for 12 hours and had to replace.  EVAL Brissa C Mausolf is a 53 y.o. female who presents to clinic with chief complaint of R posterior ankle pain.  Started in January with no specific inciting event.  Has slowly worsened  since that time.  The has used a boot as needed, she is using nitroglycerine patches, she ices.  She has some modest improvement but nothing significant.  Attempted eccentrics which were too painful    Pain:  Are you having pain? 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  Education administrator: NA  Hand Dominance: NA  Patient Goals/Specific Activities: improved comfort with walking (10 min currently, was walking up to 3 miles/day), yard work   OBJECTIVE:   DIAGNOSTIC FINDINGS:  None relevant  GENERAL OBSERVATION/GAIT:  Antalgic gait with reduced time in stance on R  PALPATION: TTP insertion of R achilles  tendon  LE MMT:  MMT Right (Eval) Left (Eval)  Hip flexion (L2, L3)    Knee extension (L3) 4 4  Knee flexion 4 4  Hip abduction    Hip extension    Hip external rotation    Hip internal rotation    Hip adduction    Ankle dorsiflexion (L4) 5 5  Ankle plantarflexion (S1) Partial ROM SL eel raise unable  Ankle inversion 4 5  Ankle eversion 4 5  Great Toe ext (L5)    Grossly     (Blank rows = not tested, score listed is out of 5 possible points OR may be listed in lbs of force.  N = WNL, D = diminished, C = clear for gross weakness with myotome testing, * = concordant pain with testing)  LE ROM:  ROM Right (Eval) Left (Eval)  Hip flexion    Hip extension    Hip abduction    Hip adduction    Hip internal rotation    Hip external rotation    Knee extension    Knee flexion    Ankle dorsiflexion 10 10  Ankle plantarflexion 50 50  Ankle inversion 35 35  Ankle eversion 20 20   (Blank rows = not tested, N = WNL, * = concordant pain with testing)  Functional Tests  Eval    Progressive balance screen (highest level completed for >/= 10''):  Feet together: 10'' Semi Tandem: R in rear 10'', L in rear 10'' Tandem: R in rear 4'', L in rear 8'' SLS: R unable, L unable                                                          PATIENT SURVEYS:  LEFS: 33/80  TODAY'S TREATMENT: [facility] Adult PT Treatment:                                                DATE: 01-14-24 Therapeutic Exercise:  Reviewed HEP in detail with PT/pt demo Toe yoga Right Seated Ankle Plantar Flexion  with GTB Seated Plantar Fascia Stretch   Seated Arch Lifts and standing Seated Figure 4 Ankle Inversion with GTB   Clamshell with Resistance  GTB SLS  with UE on back of chair Seated Heel Raise   Alternate Long Sitting Ankle Inversion withGTB   Pt sometimes with difficult with Right ankle on Left knee Long  Sitting Ankle Dorsiflexion with RTB Manual Therapy: STW R plantar fascia R heel pad  taping  [facility] Adult PT Treatment:                                                DATE: 01-06-24 Therapeutic Exercise: Seated Ankle Plantar Flexion  with GTB Seated Plantar Fascia Stretch   Seated Arch Lifts  Seated Figure 4 Ankle Inversion with GTB   Clamshell with Resistance  GTB SLS  at counter Seated Heel Raise   Alternate Long Sitting Ankle Inversion withGTB   Pt sometimes with difficult with Right ankle on Left knee Long Sitting Ankle Dorsiflexion with RTB Updated HEP Manual Therapy: STW R plantar fascia R heel pad taping  Self Care: Timeline of healing.  Progressive overload and adaptability of tissues Shoe wear and heel cup silicone cups for comfort for prolonged standing    EVAL Therapeutic Exercise: Creating, reviewing, and completing below HEP   PATIENT EDUCATION ([place]/[facility]):  POC, diagnosis, prognosis, HEP, and outcome measures.  Pt educated via explanation, demonstration, and handout (HEP).  Pt confirms understanding verbally.   HOME EXERCISE PROGRAM: Access Code: [email or web address] URL: [email or web address] Date: 01/06/2024 Prepared by: Graydon Dingwall  Program Notes toe Yoga  Exercises - Seated Ankle Plantar Flexion with Resistance Loop  - 1-2 x daily - 7 x weekly - 5 reps - 30-45 second hold - Seated Plantar Fascia Stretch  - 1 x daily - 7 x weekly - 1 sets - 3 reps - Seated Arch Lifts  - 1 x daily - 7 x weekly - 3 sets - 10 reps - 5 hold - Seated Figure 4 Ankle Inversion with Resistance  - 1 x daily - 7 x weekly - 3 sets - 10 reps - Clamshell with Resistance  - 1-15 x daily - 7 x weekly - 2 sets - 10 reps - Single Leg Stance  - 1 x daily - 7 x weekly - 1 sets - 4-5 reps - 30 to 60 seconds hold - Seated Heel Raise  - 1 x daily - 7 x weekly - 3 sets - 10 reps - Long Sitting Ankle Inversion with Resistance  - 1 x daily - 7 x weekly - 3 sets - 10 reps - 5 hold - Long Sitting Ankle Dorsiflexion with Anchored Resistance  - 1 x daily - 7 x weekly - 3 sets -  10 reps - 5 hold Treatment priorities   Eval        Progressive loading as tolerated                                          ASSESSMENT:  CLINICAL IMPRESSION:  Charlayne enters clinic with 8/10 pain in Right heel.  Pt stated heel pad worked well from last Thursday to Saturday but then pain began again.  Session concentrated on detailed explanation demo of each exercise in HEP.  Pt also had heel fat pad taping at end of session. Will continue progressing as pt is able to tolerate.  At end of session pain  reduce to 5-6/10     Eval Ravynn is a 53 y.o. female who presents to clinic with signs and sxs consistent with R posterior heel pain.  Consistent with physician impression  of insertional achilles tendinopathy.   She is using a heel lift and nitro patches.  Tried eccentrics but this was too much load and agging.  Started with band isometrics and will progress as able.   Madisynn will benefit from skilled PT to address relevant deficits and improve comfort in walking and recreational activities.   OBJECTIVE IMPAIRMENTS: Pain, ankle ROM, gait, balance  ACTIVITY LIMITATIONS: walking, standing, housework, stairs  PERSONAL FACTORS: See medical history and pertinent history   REHAB POTENTIAL: Good  CLINICAL DECISION MAKING: Evolving/moderate complexity  EVALUATION COMPLEXITY: Moderate   GOALS:   SHORT TERM GOALS: Target date: 01/30/2024   Francisco will be >75% HEP compliant to improve carryover between sessions and facilitate independent management of condition  Evaluation: ongoing Goal status: ONGOING   LONG TERM GOALS: Target date: 02/27/2024   Leona will self report >/= 50% decrease in pain from evaluation to improve function in daily tasks  Evaluation/Baseline: 10/10 max pain Goal status: INITIAL   2.  Jerzee will show a >/= 20 pt improvement in LEFS score (MCID is ~11% or 9 pts) as a proxy for functional improvement   Evaluation/Baseline: 33/80 pts Goal status:  INITIAL   3.  Dorathea will be able to complete yard work, including mowing her grass, not limited by pain  Evaluation/Baseline: limited Goal status: INITIAL   4.  Lenice will be able to walk for 30 min, not limited by pain   Evaluation/Baseline: 10 min Goal status: INITIAL    5.  Dewanda will be able to perform at least partial ROM SL heel raise on R, not limited by pain  Evaluation/Baseline: unable Goal status: INITIAL    PLAN: PT FREQUENCY: 1-2x/week  PT DURATION: 8 weeks  PLANNED INTERVENTIONS:  [id number]- PT Re-evaluation, 97110-Therapeutic exercises, 97530- Therapeutic activity, [id number]- Neuromuscular re-education, 97535- Self Care, [id number]- Manual therapy, [id number]- Gait training, [id number]- Aquatic Therapy, [id number]- Electrical stimulation (manual), [id number]- Vasopneumatic device, [id number]- Traction (mechanical), [id number]- Ionotophoresis 4mg /ml Dexamethasone , Taping, Dry Needling, Joint manipulation, and Spinal manipulation.   Graydon Dingwall, PT, ATRIC Certified Exercise Expert for the Aging Adult  01/14/24 3:46 PM Phone: [phone] Fax: [phone]

## 2024-01-13 NOTE — Progress Notes (Unsigned)
 Name: Mackenzie [name]  MRN/ DOB: [id number], Aug 20, 1970    Age/ Sex: 53 y.o., female    PCP: [name], MD   Reason for Endocrinology Evaluation:  Hyperthyroidism     Date of Initial Endocrinology Evaluation: 07/22/2021    HPI: Mackenzie [name] is a 53 y.o. female with a past medical history of HTN, hx of  left breast Ca . The patient presented for initial endocrinology clinic visit on 07/22/2021 for consultative assistance with her  hyperthyroidism.   Pt has been diagnosed with hyperthyroidism in 05/2021 with a suppressed TSH <0.005 uIU/mL and elevated T4  and T3 at 5.96 and 417 ng/dL respectively.    She has no hx of osteoporosis  Of note, she is s/p left mastectomy due to breast ca, followed by chemo and radiation    Methimazole  was started 07/2021 with a suppressed TSH<0.005u IU/mL, elevated free T4 at 5.96 NG/DL and elevated T3 at 582 NG/DL TRAb was elevated at 9.2 IU/L  No fh of thyroid  disease   Thyroid  ultrasound 01/2023 showed multiple thyroid  nodules    SUBJECTIVE:    Today (01/13/24): Mackenzie [name] is here for follow-up on hyperthyroidism.     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, left (HCC) 01/23/2017   Cellulitis of right leg    Edema, lower extremity    GERD (gastroesophageal reflux disease)    Healthcare maintenance 03/25/2017   Herpesviral infection    Hot flashes 03/2017   Hyperlipidemia 03/20/2016    Impaired fasting glucose    Morbid obesity with body mass index (BMI) of 40.0 to 49.9 (HCC) 09/17/2017   Neoplasm of cervix    Osteoarthritis of knee    Other chest pain    Other specified disorders of veins    Personal history of chemotherapy    Personal history of radiation therapy    Polyneuropathy    Postmenopausal atrophic vaginitis    Pre-diabetes 07/26/2018   Hgb A1C elevated on 07/26/2018, Gestational Diabetes 2012   Pure hypercholesterolemia 03/30/2023   Seasonal allergies 2012   seasonal allergies causes allergic rhinitis and itchy, dry eyes per pt   Stress incontinence    Tachycardia    Thyrotoxicosis    Vitamin D  deficiency 05/2015   Past Surgical History:  Past Surgical History:  Procedure Laterality Date   BREAST RECONSTRUCTION WITH PLACEMENT OF TISSUE EXPANDER AND FLEX HD (ACELLULAR HYDRATED DERMIS) Left 08/27/2017   Procedure: LEFT BREAST RECONSTRUCTION WITH PLACEMENT OF TISSUE EXPANDER AND FLEX HD;  Surgeon: Lowery Estefana RAMAN, DO;  [facility];  Service: Plastics;  Laterality: Left;   CESAREAN SECTION     x2   KNEE ARTHROSCOPY WITH MEDIAL MENISECTOMY Right 11/12/2022   Procedure: RIGHT KNEE ARTHROSCOPY, PARTIAL MEDIAL MENISCECTOMY;  Surgeon: Jerri Kay HERO, MD;  Location: [facility];  Service: Orthopedics;  Laterality: Right;   LATISSIMUS FLAP TO  BREAST Left 11/03/2018   LATISSIMUS FLAP TO BREAST Left 11/03/2018   Procedure: LATISSIMUS FLAP TO LEFT BREAST;  Surgeon: Lowery Estefana RAMAN, DO;  [facility];  Service: Plastics;  Laterality: Left;   MASTECTOMY MODIFIED RADICAL Left 08/27/2017   MASTECTOMY MODIFIED RADICAL Left 08/27/2017   Procedure: LEFT MODIFIED RADICAL MASTECTOMY;  Surgeon: Vanderbilt Ned, MD;  [facility];  Service: General;  Laterality: Left;   MASTOPEXY Right 03/03/2019   Procedure: RIGHT BREAST MASTOPEXY/REDUCTION;  Surgeon: Lowery Estefana RAMAN, DO;  Location: [facility];  Service: Plastics;   Laterality: Right;  3 hours, please   PORT-A-CATH REMOVAL Right 08/27/2017   Procedure: REMOVAL PORT-A-CATH RIGHT CHEST;  Surgeon: Vanderbilt Ned, MD;  [facility];  Service: General;  Laterality: Right;   PORTACATH PLACEMENT Right 01/28/2017   Procedure: INSERTION PORT-A-CATH WITH ULTRASOUND;  Surgeon: Vanderbilt Ned, MD;  Location: [facility];  Service: General;  Laterality: Right;   REDUCTION MAMMAPLASTY Right    REMOVAL OF TISSUE EXPANDER AND PLACEMENT OF IMPLANT Left 03/03/2019   Procedure: REMOVAL OF TISSUE EXPANDER AND PLACEMENT OF EXPANDER;  Surgeon: Lowery Estefana RAMAN, DO;  Location: [facility];  Service: Plastics;  Laterality: Left;   REMOVAL OF TISSUE EXPANDER AND PLACEMENT OF IMPLANT Left 05/25/2019   Procedure: LEFT BREAST REMOVAL OF TISSUE EXPANDER AND PLACEMENT OF IMPLANT;  Surgeon: Lowery Estefana RAMAN, DO;  Location: [facility];  Service: Plastics;  Laterality: Left;   TISSUE EXPANDER PLACEMENT Left 11/03/2018   Procedure: PLACEMENT OF TISSUE EXPANDER LEFT BREAST;  Surgeon: Lowery Estefana RAMAN, DO;  [facility];  Service: Plastics;  Laterality: Left;  Total case time is 3.5 hours   TUBAL LIGATION Bilateral 01/21/2011    Social History:  reports that she has never smoked. She has never used smokeless tobacco. She reports current alcohol use. She reports that she does not use drugs. Family History: family history includes Breast cancer (age of onset: 37) in her paternal grandmother; Diabetes in her mother and another family member; Hypertension in her maternal grandmother, mother, and another family member; Kidney disease in her father; Other in her mother; Stroke in her mother and another family member.   HOME MEDICATIONS: Allergies as of 01/13/2024       Reactions   Dilaudid  [hydromorphone ] Itching        Medication List        Accurate as of January 13, 2024  9:44 AM. If you have any questions, ask your nurse or  doctor.          acetaminophen  500 MG tablet Commonly known as: TYLENOL  Take 1,000 mg by mouth every 6 (six) hours as needed (for pain/headaches.).   buPROPion  200 MG 12 hr tablet Commonly known as: Wellbutrin  SR Take 1 tablet (200 mg total) by mouth daily.   cetirizine 10 MG tablet Commonly known as: ZYRTEC Take 10 mg by mouth daily.   cyclobenzaprine  10 MG tablet Commonly known as: FLEXERIL  Take 1/2 tablet (5 mg total) by mouth 2 (two) times daily as needed.   cyclobenzaprine  10 MG tablet Commonly known as: FLEXERIL  Take 1/2 tablet (5 mg total) by mouth 2 (two) times daily as needed.   diclofenac  75 MG EC tablet Commonly known as: VOLTAREN  Take 1 tablet (75 mg total) by mouth 2 (two) times daily as needed.   Fish Oil 1000 MG Caps Take 1,000 mg by mouth 2 (two) times daily.   gabapentin  300 MG capsule Commonly known  as: NEURONTIN  Take 1 capsule (300 mg total) by mouth 3 (three) times daily.   letrozole  2.5 MG tablet Commonly known as: FEMARA  Take 1 tablet (2.5 mg total) by mouth daily.   loratadine  10 MG tablet Commonly known as: Claritin  Take 1 tablet (10 mg total) by mouth daily. What changed:  when to take this reasons to take this   losartan  50 MG tablet Commonly known as: COZAAR  Take 1 tablet (50 mg total) by mouth daily.   metFORMIN  500 MG 24 hr tablet Commonly known as: GLUCOPHAGE -XR Take 1 tablet (500 mg total) by mouth 2 (two) times daily with a meal.   methimazole  5 MG tablet Commonly known as: TAPAZOLE  Take 1 tablet (5 mg total) by mouth daily in the afternoon.   metoprolol  succinate 50 MG 24 hr tablet Commonly known as: TOPROL -XL Take 1/2 tablet (25 mg total) by mouth daily.   montelukast  10 MG tablet Commonly known as: SINGULAIR  Take 1 tablet (10 mg total) by mouth at bedtime.   multivitamin with minerals Tabs tablet Take 1 tablet by mouth daily.   nitroGLYCERIN  0.2 mg/hr patch Commonly known as: NITRODUR - Dosed in mg/24 hr Apply  1/4th patch to affected achilles, change daily   omeprazole  40 MG capsule Commonly known as: PRILOSEC Take 1 capsule (40 mg total) by mouth daily 1/2 to 1 hour before morning meal.   ondansetron  4 MG tablet Commonly known as: Zofran  Take 1 tablet (4 mg total) by mouth every 8 (eight) hours as needed for nausea or vomiting.   PROBIOTIC DAILY PO Take 1 capsule by mouth 2 (two) times daily.   rosuvastatin  20 MG tablet Commonly known as: CRESTOR  Take 1 tablet by mouth daily   spironolactone  25 MG tablet Commonly known as: ALDACTONE  Take 1 tablet (25 mg total) by mouth daily.   traMADol  50 MG tablet Commonly known as: ULTRAM  Take 1-2 tablets (50-100 mg total) by mouth daily as needed.   VITAMIN C PO Take by mouth daily.   VITAMIN D3/CALCIUM /PHOSPHORUS PO Take 600 mg by mouth daily.   ZINC OXIDE PO Take 1 tablet by mouth 2 (two) times a day.          REVIEW OF SYSTEMS: A comprehensive ROS was conducted with the patient and is negative except as per HPI     OBJECTIVE:  VS: BP 110/72 (BP Location: Right Arm, Patient Position: Sitting, Cuff Size: Large)   Pulse 74   Ht 5' 5 (1.651 m)   Wt 275 lb (124.7 kg)   LMP 11/28/2016   SpO2 95%   BMI 45.76 kg/m    Wt Readings from Last 3 Encounters:  01/13/24 275 lb (124.7 kg)  01/11/24 269 lb (122 kg)  12/30/23 260 lb (117.9 kg)     EXAM: General: Pt appears well and is in NAD  Neck:  Thyroid : Thyroid  gland is prominent  Lungs: Clear with good BS bilat   Heart: Auscultation: RRR.  Abdomen:  soft, nontender  Extremities:  BL LE: No pretibial edema   Mental Status: Judgment, insight: Intact Orientation: Oriented to time, place, and person Mood and affect: No depression, anxiety, or agitation     DATA REVIEWED:  ****  Latest Reference Range & Units 12/01/23 09:03  Sodium 135 - 145 mmol/L 135  Potassium 3.5 - 5.1 mmol/L 4.2  Chloride 98 - 111 mmol/L 103  CO2 22 - 32 mmol/L 25  Glucose 70 - 99 mg/dL 895  (H)  BUN 6 - 20 mg/dL  21 (H)  Creatinine 0.44 - 1.00 mg/dL 9.10  Calcium  8.9 - 10.3 mg/dL 9.5  Anion gap 5 - 15  7  Alkaline Phosphatase 38 - 126 U/L 118  Albumin  3.5 - 5.0 g/dL 4.4  AST 15 - 41 U/L 16  ALT 0 - 44 U/L 22  Total Protein 6.5 - 8.1 g/dL 8.0  Total Bilirubin 0.0 - 1.2 mg/dL 0.4  GFR, Est Non African American >60 mL/min >60  (H): Data is abnormally high   TRAB <=2.00 IU/L 9.20 High     Thyroid  Ultrasound 01/20/2023  Narrative & Impression  CLINICAL DATA:  Hyperthyroidism   Right thyroid  asymmetry on physical examination   EXAM: THYROID  ULTRASOUND   TECHNIQUE: Ultrasound examination of the thyroid  gland and adjacent soft tissues was performed.   COMPARISON:  None available.   FINDINGS: Parenchymal Echotexture: Mildly heterogenous   Isthmus: 0.3 cm   Right lobe: 6.0 x 2.3 x 2.8 cm   Left lobe: 4.9 x 1.8 x 2.2 cm   _________________________________________________________   Estimated total number of nodules >/= 1 cm: 4   Number of spongiform nodules >/=  2 cm not described below (TR1): 0   Number of mixed cystic and solid nodules >/= 1.5 cm not described below (TR2): 0   _________________________________________________________   Nodule # 1:   Location: Right; superior   Maximum size: 1.8 cm; Other 2 dimensions: 1.1 x 1.2 cm   Composition: solid/almost completely solid (2)   Echogenicity: isoechoic (1)   Shape: not taller-than-wide (0)   Margins: ill-defined (0)   Echogenic foci: none (0)   ACR TI-RADS total points: 3.   ACR TI-RADS risk category: TR3 (3 points).   ACR TI-RADS recommendations:   *Given size (>/= 1.5 - 2.4 cm) and appearance, a follow-up ultrasound in 1 year should be considered based on TI-RADS criteria.   _________________________________________________________   Nodule 2: 1.1 x 1.0 x 0.8 cm solid isoechoic right mid thyroid  nodule (TI-RADS 3) does not meet criteria for imaging surveillance or FNA.    _________________________________________________________   Nodule 3: 1.3 x 1.0 x 1.1 cm solid isoechoic right mid thyroid  nodule (TI-RADS 3) does not meet criteria for imaging surveillance or FNA.   _________________________________________________________   Nodule 4: 0.9 x 0.8 x 0.9 cm solid isoechoic right inferior thyroid  nodule (TI-RADS 3) does not meet criteria for imaging surveillance or FNA.   _________________________________________________________   Nodule # 5:   Location: Left; mid   Maximum size: 1.4 cm; Other 2 dimensions: 1.1 x 1.1 cm   Composition: solid/almost completely solid (2)   Echogenicity: hypoechoic (2)   Shape: not taller-than-wide (0)   Margins: ill-defined (0)   Echogenic foci: none (0)   ACR TI-RADS total points: 4.   ACR TI-RADS risk category: TR4 (4-6 points).   ACR TI-RADS recommendations:   *Given size (>/= 1 - 1.4 cm) and appearance, a follow-up ultrasound in 1 year should be considered based on TI-RADS criteria.   IMPRESSION: Nodules 1 and 5 meet criteria for imaging follow-up. Annual ultrasound surveillance is recommended until 5 years of stability is documented.        ASSESSMENT/PLAN/RECOMMENDATIONS:   Hyperthyroidism:   -Patient is clinically euthyroid -TFTs remain normal, no change    Medication Can continue methimazole  5 mg, 1 tablet daily  2.  Graves' disease:  -No extrathyroidal manifestation of Graves' disease   3.  Multinodular goiter:  -No local neck symptoms -She will be due for repeat thyroid  ultrasound 01/2024  Follow-up in 6 months   Signed electronically by: Stefano Redgie Butts, MD  [facility] Endocrinology  [facility] [address] [place] [place], [place] [address] Phone: [phone] FAX: [phone]   CC: [name], MD [address] [place] [address] [place] [address] Phone: [phone] Fax: [phone]   Return to Endocrinology clinic as  below: Future Appointments  Date Time Provider Department Center  01/14/2024  2:15 PM Konnie Graydon GORMAN ALMETA [facility]  01/18/2024  9:00 AM Aden Berwyn LABOR, PTA [facility] None  01/20/2024 10:15 AM Carita Harlene HERO, PTA [facility]  01/22/2024  8:00 AM Carita Harlene HERO, PTA [facility]  01/25/2024  8:45 AM Carita Harlene HERO [facility]  01/28/2024  8:45 AM Dominic Helene BRAVO, PT [facility]  02/03/2024  9:00 AM Cleatrice Ludie SAUNDERS, MD [facility]  02/09/2024  8:30 AM Jonel Pee D, NP [facility] None  03/08/2024  8:30 AM Jonel Pee D, NP [facility] None  05/23/2024  8:45 AM [facility] None  05/23/2024  9:15 AM Loretha Ash, MD [facility] None

## 2024-01-14 ENCOUNTER — Other Ambulatory Visit (HOSPITAL_COMMUNITY): Payer: Self-pay

## 2024-01-14 ENCOUNTER — Ambulatory Visit: Attending: Family Medicine | Admitting: Physical Therapy

## 2024-01-14 ENCOUNTER — Ambulatory Visit: Payer: Self-pay | Admitting: Internal Medicine

## 2024-01-14 ENCOUNTER — Other Ambulatory Visit: Payer: Self-pay | Admitting: Physician Assistant

## 2024-01-14 ENCOUNTER — Encounter: Payer: Self-pay | Admitting: Physical Therapy

## 2024-01-14 DIAGNOSIS — G8929 Other chronic pain: Secondary | ICD-10-CM | POA: Diagnosis not present

## 2024-01-14 DIAGNOSIS — M25671 Stiffness of right ankle, not elsewhere classified: Secondary | ICD-10-CM | POA: Diagnosis not present

## 2024-01-14 DIAGNOSIS — R600 Localized edema: Secondary | ICD-10-CM | POA: Insufficient documentation

## 2024-01-14 DIAGNOSIS — M25561 Pain in right knee: Secondary | ICD-10-CM | POA: Diagnosis not present

## 2024-01-14 DIAGNOSIS — M6281 Muscle weakness (generalized): Secondary | ICD-10-CM | POA: Diagnosis not present

## 2024-01-14 DIAGNOSIS — Z483 Aftercare following surgery for neoplasm: Secondary | ICD-10-CM | POA: Insufficient documentation

## 2024-01-14 DIAGNOSIS — R2689 Other abnormalities of gait and mobility: Secondary | ICD-10-CM | POA: Insufficient documentation

## 2024-01-14 DIAGNOSIS — M25571 Pain in right ankle and joints of right foot: Secondary | ICD-10-CM | POA: Diagnosis not present

## 2024-01-14 LAB — T4, FREE: Free T4: 1.2 ng/dL (ref 0.8–1.8)

## 2024-01-14 LAB — T3, FREE: T3, Free: 3.3 pg/mL (ref 2.3–4.2)

## 2024-01-14 LAB — TSH: TSH: 1.73 m[IU]/L

## 2024-01-14 MED ORDER — METHIMAZOLE 5 MG PO TABS
5.0000 mg | ORAL_TABLET | Freq: Every day | ORAL | 3 refills | Status: AC
  Filled 2024-01-14 – 2024-04-02 (×2): qty 90, 90d supply, fill #0
  Filled 2024-06-27: qty 90, 90d supply, fill #1

## 2024-01-14 MED ORDER — TRAMADOL HCL 50 MG PO TABS
50.0000 mg | ORAL_TABLET | Freq: Every day | ORAL | 1 refills | Status: AC | PRN
  Filled 2024-01-14: qty 30, 15d supply, fill #0
  Filled 2024-03-15: qty 30, 15d supply, fill #1

## 2024-01-14 NOTE — Telephone Encounter (Signed)
 Sent message via MyChart.

## 2024-01-14 NOTE — Telephone Encounter (Signed)
 Defer nsaids to pcp based on kidney function.  Sent in one more rx for tramadol  with refill.  If she needs to continue this, we need to refer to pain mgmt

## 2024-01-15 ENCOUNTER — Ambulatory Visit
Admission: RE | Admit: 2024-01-15 | Discharge: 2024-01-15 | Disposition: A | Discharge status: Home / Self Care | Source: Ambulatory Visit | Attending: Internal Medicine | Admitting: Internal Medicine

## 2024-01-15 DIAGNOSIS — E042 Nontoxic multinodular goiter: Secondary | ICD-10-CM | POA: Diagnosis not present

## 2024-01-15 DIAGNOSIS — E0590 Thyrotoxicosis, unspecified without thyrotoxic crisis or storm: Secondary | ICD-10-CM

## 2024-01-16 ENCOUNTER — Other Ambulatory Visit (HOSPITAL_COMMUNITY): Payer: Self-pay

## 2024-01-18 ENCOUNTER — Ambulatory Visit: Payer: 59

## 2024-01-18 VITALS — Wt 269.4 lb

## 2024-01-18 DIAGNOSIS — Z483 Aftercare following surgery for neoplasm: Secondary | ICD-10-CM

## 2024-01-18 NOTE — Therapy (Signed)
 OUTPATIENT PHYSICAL THERAPY SOZO SCREENING NOTE   Patient Name: Mackenzie [name] MRN: [id number] DOB:07/23/1970, 53 y.o., female Today's Date: 01/18/2024  PCP: [name], MD REFERRING PROVIDER: Andris Estefana BRAVO, PA-C   PT End of Session - 01/18/24 [phone]     Visit Number 3   # unchanged due to screen only   PT Start Time 0920    PT Stop Time 0924    PT Time Calculation (min) 4 min    Activity Tolerance Patient tolerated treatment well    Behavior During Therapy [facility] for tasks assessed/performed          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, left (HCC) 01/23/2017   Cellulitis of right leg    Edema, lower extremity    GERD (gastroesophageal reflux disease)    Healthcare maintenance 03/25/2017   Herpesviral infection    Hot flashes 03/2017   Hyperlipidemia 03/20/2016   Impaired fasting glucose    Morbid obesity with body mass index (BMI) of 40.0 to 49.9 (HCC) 09/17/2017   Neoplasm of cervix    Osteoarthritis of knee    Other chest pain    Other specified disorders of veins    Personal history of chemotherapy    Personal history of radiation therapy    Polyneuropathy    Postmenopausal atrophic vaginitis    Pre-diabetes 07/26/2018   Hgb A1C elevated on 07/26/2018, Gestational Diabetes 2012   Pure hypercholesterolemia 03/30/2023   Seasonal allergies 2012   seasonal allergies causes allergic rhinitis and itchy, dry eyes per pt   Stress incontinence    Tachycardia    Thyrotoxicosis    Vitamin D  deficiency 05/2015   Past Surgical History:  Procedure Laterality Date   BREAST RECONSTRUCTION WITH PLACEMENT OF TISSUE EXPANDER AND FLEX HD (ACELLULAR HYDRATED DERMIS) Left 08/27/2017    Procedure: LEFT BREAST RECONSTRUCTION WITH PLACEMENT OF TISSUE EXPANDER AND FLEX HD;  Surgeon: Lowery Estefana RAMAN, DO;  [facility];  Service: Plastics;  Laterality: Left;   CESAREAN SECTION     x2   KNEE ARTHROSCOPY WITH MEDIAL MENISECTOMY Right 11/12/2022   Procedure: RIGHT KNEE ARTHROSCOPY, PARTIAL MEDIAL MENISCECTOMY;  Surgeon: Jerri Kay HERO, MD;  Location: [facility];  Service: Orthopedics;  Laterality: Right;   LATISSIMUS FLAP TO BREAST Left 11/03/2018   LATISSIMUS FLAP TO BREAST Left 11/03/2018   Procedure: LATISSIMUS FLAP TO LEFT BREAST;  Surgeon: Lowery Estefana RAMAN, DO;  [facility];  Service: Plastics;  Laterality: Left;   MASTECTOMY MODIFIED RADICAL Left 08/27/2017   MASTECTOMY MODIFIED RADICAL Left 08/27/2017   Procedure: LEFT MODIFIED RADICAL MASTECTOMY;  Surgeon: Vanderbilt Ned, MD;  [facility];  Service: General;  Laterality: Left;   MASTOPEXY Right 03/03/2019   Procedure: RIGHT BREAST MASTOPEXY/REDUCTION;  Surgeon: Lowery Estefana RAMAN, DO;  Location: [facility];  Service: Plastics;  Laterality: Right;  3 hours, please   PORT-A-CATH REMOVAL Right 08/27/2017   Procedure: REMOVAL PORT-A-CATH RIGHT CHEST;  Surgeon: Vanderbilt Ned, MD;  [facility];  Service: General;  Laterality: Right;   PORTACATH PLACEMENT Right 01/28/2017   Procedure: INSERTION PORT-A-CATH WITH ULTRASOUND;  Surgeon: Vanderbilt Ned, MD;  Location:   [facility];  Service: General;  Laterality: Right;   REDUCTION MAMMAPLASTY Right    REMOVAL OF TISSUE EXPANDER AND PLACEMENT OF IMPLANT Left 03/03/2019   Procedure: REMOVAL OF TISSUE EXPANDER AND PLACEMENT OF EXPANDER;  Surgeon: Lowery Estefana RAMAN, DO;  Location: [facility];  Service: Plastics;  Laterality: Left;   REMOVAL OF TISSUE EXPANDER AND PLACEMENT OF IMPLANT Left 05/25/2019   Procedure: LEFT BREAST REMOVAL OF TISSUE EXPANDER AND PLACEMENT OF IMPLANT;  Surgeon: Lowery Estefana RAMAN, DO;   Location: [facility];  Service: Plastics;  Laterality: Left;   TISSUE EXPANDER PLACEMENT Left 11/03/2018   Procedure: PLACEMENT OF TISSUE EXPANDER LEFT BREAST;  Surgeon: Lowery Estefana RAMAN, DO;  [facility];  Service: Plastics;  Laterality: Left;  Total case time is 3.5 hours   TUBAL LIGATION Bilateral 01/21/2011   Patient Active Problem List   Diagnosis Date Noted   SOBOE (shortness of breath on exertion) 11/11/2023   Primary osteoarthritis of left knee 10/09/2023   Achilles tendinitis, right leg 10/09/2023   Graves disease 07/16/2023   Multinodular goiter 07/16/2023   Right carpal tunnel syndrome 07/10/2023   Left carpal tunnel syndrome 07/10/2023   Pain of Right Knee 07/06/2023   History of breast reconstruction 05/15/2023   Pure hypercholesterolemia 03/30/2023   Acute meniscal tear, medial, right, initial encounter 11/12/2022   Plica syndrome of right knee 11/12/2022   Chondromalacia, right knee 11/12/2022   Depression 08/13/2022   BMI 40.0-44.9, adult (HCC) 08/13/2022   Obesity, Beginning BMI 08/13/2022   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   Boil of buttock, Left 12/24/2021   Hyperthyroidism 07/22/2021   Low serum thyroid  stimulating hormone (TSH) 06/12/2021   Other fatigue 05/23/2021   Hemoglobin low 05/23/2021   Polyphagia 05/16/2020   Vitamin D  deficiency 05/16/2020   At risk for osteoporosis 05/16/2020   Prediabetes 05/01/2020   Elevated blood pressure reading 05/01/2020   At risk for impaired metabolic function 05/01/2020   Breast asymmetry following reconstructive surgery 09/20/2019   Essential hypertension 08/17/2018   Class 3 severe obesity with serious comorbidity and body mass index (BMI) of 40.0 to 44.9 in adult 09/17/2017   Acquired absence of left breast 09/15/2017   Malignant neoplasm of upper-inner quadrant of left breast in female, 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   Healthcare maintenance 03/25/2017   Malignant  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, estrogen receptor positive (HCC) 01/23/2017   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 Then had neo-adjuvant chemo starting 02/10/17 until 06/26/17; had left mastectomy 08/27/17 with ALND and immediate expander placed. Radiation completed. Expander removed and replaced on May 27, 19 b/c it did not get expanded enough prior to radiation, also had lat flap 11/03/18. Had expander removed and replaced again on 03/03/19 due to a small leak, pt had reduction on R breast at this time   PRECAUTIONS: left UE Lymphedema risk, None  SUBJECTIVE: Pt returns for her last 6 month L-Dex screen.    PAIN:  Are you having pain? No  SOZO SCREENING: Patient was assessed today using the SOZO machine to determine the lymphedema index score. This was compared to her baseline score. It was determined that she is within the recommended range when compared to  her baseline and no further action is needed at this time. She will continue SOZO screenings. These are done every 3 months for 2 years post operatively followed by every 6 months for 2 years, and then annually.     L-DEX FLOWSHEETS - 01/18/24 0900       L-DEX LYMPHEDEMA SCREENING   Measurement Type Unilateral    L-DEX MEASUREMENT EXTREMITY Upper Extremity    POSITION  Standing    DOMINANT SIDE Right    At Risk Side Left    BASELINE SCORE (UNILATERAL) 5.3    L-DEX SCORE (UNILATERAL) 5.4    VALUE CHANGE (UNILAT) 0.1          P: Begin annual.  Aden Berwyn Caldron, PTA 01/18/2024, 9:23 AM

## 2024-01-19 ENCOUNTER — Other Ambulatory Visit (HOSPITAL_COMMUNITY): Payer: Self-pay

## 2024-01-20 ENCOUNTER — Ambulatory Visit: Admitting: Physical Therapy

## 2024-01-20 ENCOUNTER — Ambulatory Visit: Admitting: Family Medicine

## 2024-01-20 ENCOUNTER — Encounter: Payer: Self-pay | Admitting: Physical Therapy

## 2024-01-20 DIAGNOSIS — R2689 Other abnormalities of gait and mobility: Secondary | ICD-10-CM

## 2024-01-20 DIAGNOSIS — M25571 Pain in right ankle and joints of right foot: Secondary | ICD-10-CM | POA: Diagnosis not present

## 2024-01-20 DIAGNOSIS — M25671 Stiffness of right ankle, not elsewhere classified: Secondary | ICD-10-CM | POA: Diagnosis not present

## 2024-01-20 DIAGNOSIS — G8929 Other chronic pain: Secondary | ICD-10-CM | POA: Diagnosis not present

## 2024-01-20 DIAGNOSIS — Z483 Aftercare following surgery for neoplasm: Secondary | ICD-10-CM

## 2024-01-20 DIAGNOSIS — M6281 Muscle weakness (generalized): Secondary | ICD-10-CM | POA: Diagnosis not present

## 2024-01-20 DIAGNOSIS — M25561 Pain in right knee: Secondary | ICD-10-CM | POA: Diagnosis not present

## 2024-01-20 DIAGNOSIS — R600 Localized edema: Secondary | ICD-10-CM | POA: Diagnosis not present

## 2024-01-20 NOTE — Therapy (Signed)
 OUTPATIENT PHYSICAL THERAPY LOWER EXTREMITY TREATMENT   Patient Name: Mackenzie [name] MRN: [id number] DOB:[birth year], 53 y.o., female Today's Date: 01/20/2024   PT End of Session - 01/20/24 1022     Visit Number 4    Date for PT Re-Evaluation 02/27/24    Authorization Type [employer] [facility]    PT Start Time 1022    PT Stop Time 1100    PT Time Calculation (min) 38 min    Activity Tolerance Patient tolerated treatment well    Behavior During Therapy [facility] for tasks assessed/performed            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, left (HCC) 01/23/2017   Cellulitis of right leg    Edema, lower extremity    GERD (gastroesophageal reflux disease)    Healthcare maintenance 03/25/2017   Herpesviral infection    Hot flashes 03/2017   Hyperlipidemia 03/20/2016   Impaired fasting glucose    Morbid obesity with body mass index (BMI) of 40.0 to 49.9 (HCC) 09/17/2017   Neoplasm of cervix    Osteoarthritis of knee    Other chest pain    Other specified disorders of veins    Personal history of chemotherapy    Personal history of radiation therapy    Polyneuropathy    Postmenopausal atrophic vaginitis    Pre-diabetes 07/26/2018   Hgb A1C elevated on 07/26/2018, Gestational Diabetes 2012   Pure hypercholesterolemia 03/30/2023   Seasonal allergies 2012   seasonal allergies causes allergic rhinitis and itchy, dry eyes per pt   Stress incontinence    Tachycardia    Thyrotoxicosis    Vitamin D  deficiency 05/2015   Past Surgical History:  Procedure Laterality Date   BREAST RECONSTRUCTION WITH PLACEMENT OF TISSUE EXPANDER AND FLEX HD (ACELLULAR HYDRATED DERMIS) Left 08/27/2017   Procedure: LEFT BREAST  RECONSTRUCTION WITH PLACEMENT OF TISSUE EXPANDER AND FLEX HD;  Surgeon: Lowery Estefana RAMAN, DO;  Location: [facility];  Service: Plastics;  Laterality: Left;   CESAREAN SECTION     x2   KNEE ARTHROSCOPY WITH MEDIAL MENISECTOMY Right 11/12/2022   Procedure: RIGHT KNEE ARTHROSCOPY, PARTIAL MEDIAL MENISCECTOMY;  Surgeon: Jerri Kay HERO, MD;  Location: [facility];  Service: Orthopedics;  Laterality: Right;   LATISSIMUS FLAP TO BREAST Left 11/03/2018   LATISSIMUS FLAP TO BREAST Left 11/03/2018   Procedure: LATISSIMUS FLAP TO LEFT BREAST;  Surgeon: Lowery Estefana RAMAN, DO;  Location: [facility];  Service: Plastics;  Laterality: Left;   MASTECTOMY MODIFIED RADICAL Left 08/27/2017   MASTECTOMY MODIFIED RADICAL Left 08/27/2017   Procedure: LEFT MODIFIED RADICAL MASTECTOMY;  Surgeon: Vanderbilt Ned, MD;  Location: [facility];  Service: General;  Laterality: Left;   MASTOPEXY Right 03/03/2019   Procedure: RIGHT BREAST MASTOPEXY/REDUCTION;  Surgeon: Lowery Estefana RAMAN, DO;  Location: [facility];  Service: Plastics;  Laterality: Right;  3 hours, please   PORT-A-CATH REMOVAL Right 08/27/2017   Procedure: REMOVAL PORT-A-CATH RIGHT CHEST;  Surgeon: Vanderbilt Ned, MD;  Location: [facility];  Service: General;  Laterality: Right;   PORTACATH PLACEMENT Right 01/28/2017   Procedure: INSERTION PORT-A-CATH WITH ULTRASOUND;  Surgeon: Vanderbilt Ned, MD;  Location: [facility];  Service: General;  Laterality: Right;   REDUCTION MAMMAPLASTY Right    REMOVAL OF TISSUE EXPANDER AND PLACEMENT OF IMPLANT Left 03/03/2019   Procedure: REMOVAL OF TISSUE EXPANDER AND PLACEMENT OF EXPANDER;  Surgeon: Lowery Estefana RAMAN, DO;  Location: [facility];  Service: Plastics;  Laterality: Left;   REMOVAL OF TISSUE EXPANDER AND PLACEMENT OF IMPLANT Left 05/25/2019   Procedure: LEFT BREAST REMOVAL OF TISSUE EXPANDER AND PLACEMENT OF IMPLANT;  Surgeon: Lowery Estefana RAMAN, DO;  Location: [facility];  Service: Plastics;  Laterality: Left;   TISSUE EXPANDER PLACEMENT Left 11/03/2018   Procedure: PLACEMENT OF TISSUE EXPANDER LEFT BREAST;  Surgeon: Lowery Estefana RAMAN, DO;  Location: [facility];  Service: Plastics;  Laterality: Left;  Total case time is 3.5 hours   TUBAL LIGATION Bilateral 01/21/2011   Patient Active Problem List   Diagnosis Date Noted   SOBOE (shortness of breath on exertion) 11/11/2023   Primary osteoarthritis of left knee 10/09/2023   Achilles tendinitis, right leg 10/09/2023   Graves disease 07/16/2023   Multinodular goiter 07/16/2023   Right carpal tunnel syndrome 07/10/2023   Left carpal tunnel syndrome 07/10/2023   Pain of Right Knee 07/06/2023   History of breast reconstruction 05/15/2023   Pure hypercholesterolemia 03/30/2023   Acute meniscal tear, medial, right, initial encounter 11/12/2022   Plica syndrome of right knee 11/12/2022   Chondromalacia, right knee 11/12/2022   Depression 08/13/2022   BMI 40.0-44.9, adult (HCC) 08/13/2022   Obesity, Beginning BMI 08/13/2022   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   Boil of buttock, Left 12/24/2021   Hyperthyroidism 07/22/2021   Low serum thyroid  stimulating hormone (TSH) 06/12/2021   Other fatigue 05/23/2021   Hemoglobin low 05/23/2021   Polyphagia 05/16/2020   Vitamin D  deficiency 05/16/2020   At risk for osteoporosis 05/16/2020   Prediabetes 05/01/2020   Elevated blood pressure reading 05/01/2020   At risk for impaired metabolic function 05/01/2020   Breast asymmetry following reconstructive surgery 09/20/2019   Essential hypertension 08/17/2018   Class 3 severe obesity with serious comorbidity and body mass index (BMI) of 40.0 to 44.9 in adult 09/17/2017   Acquired absence of left breast 09/15/2017   Malignant neoplasm of upper-inner quadrant of left breast in female, 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   Healthcare maintenance 03/25/2017   Malignant neoplasm of  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, estrogen receptor positive (HCC) 01/23/2017   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 No, Number of falls: 0  MOI/History of condition:  Onset date: January  SUBJECTIVE STATEMENT  01-20-24 Mackenzie [name] enters clinic with  8/10 pain in Right heel. Heel pad taping works well.   EVAL Mackenzie [name] is a 53 y.o. female who presents to clinic with chief complaint of R posterior ankle pain.  Started in January with no specific inciting event.  Has slowly worsened since that time.  The has used a boot as needed, she is using nitroglycerine patches, she ices.  She has some modest improvement but nothing significant.  Attempted eccentrics which were too painful    Pain:  Are you having pain? 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  Education administrator: NA  Hand Dominance: NA  Patient Goals/Specific Activities: improved comfort with walking (10 min currently, was walking up to 3 miles/day), yard work   OBJECTIVE:   DIAGNOSTIC FINDINGS:  None relevant  GENERAL OBSERVATION/GAIT:  Antalgic gait with reduced time in stance on R  PALPATION: TTP insertion of R achilles tendon  LE MMT:  MMT  Right (Eval) Left (Eval) Right 01/20/24  Hip flexion (L2, L3)     Knee extension (L3) 4 4   Knee flexion 4 4   Hip abduction     Hip extension     Hip external rotation     Hip internal rotation     Hip adduction     Ankle dorsiflexion (L4) 5 5   Ankle plantarflexion (S1) Partial ROM SL eel raise unable   Ankle inversion 4 5 5   Ankle eversion 4 5 5   Great Toe ext (L5)     Grossly      (Blank rows = not tested, score listed is out of 5 possible points OR may be listed in lbs of force.  N = WNL, D = diminished, C = clear for gross weakness with myotome testing, * = concordant pain with testing)  LE ROM:  ROM Right (Eval) Left (Eval)  Hip flexion    Hip extension    Hip abduction    Hip adduction    Hip internal rotation    Hip external rotation    Knee extension    Knee flexion    Ankle dorsiflexion 10 10  Ankle plantarflexion 50 50  Ankle inversion 35 35  Ankle eversion 20 20   (Blank rows = not tested, N = WNL, * = concordant pain with testing)  Functional Tests  Eval    Progressive balance screen (highest level completed for >/= 10''):  Feet together: 10'' Semi Tandem: R in rear 10'', L in rear 10'' Tandem: R in rear 4'', L in rear 8'' SLS: R unable, L unable                                                          PATIENT SURVEYS:  LEFS: 33/80  TODAY'S TREATMENT: [facility] Adult PT Treatment:                                                DATE: 01/20/24 Therapeutic Exercise: Seated BTB  Seated heel raise , 10 sec holds Seated heel raise on 2 inch step , 5 sec x 10 , added 10# to knee x 40  Supine DF GTB x 10 Right heel pad taping.      [facility] Adult PT Treatment:  DATE: 01-14-24 Therapeutic Exercise:  Reviewed HEP in detail with PT/pt demo Toe yoga Right Seated Ankle Plantar Flexion  with GTB Seated Plantar Fascia Stretch   Seated Arch Lifts and standing Seated Figure 4 Ankle Inversion with GTB    Clamshell with Resistance  GTB SLS  with UE on back of chair Seated Heel Raise   Alternate Long Sitting Ankle Inversion withGTB   Pt sometimes with difficult with Right ankle on Left knee Long Sitting Ankle Dorsiflexion with RTB Manual Therapy: STW R plantar fascia R heel pad taping  [facility] Adult PT Treatment:                                                DATE: 01-06-24 Therapeutic Exercise: Seated Ankle Plantar Flexion  with GTB Seated Plantar Fascia Stretch   Seated Arch Lifts  Seated Figure 4 Ankle Inversion with GTB   Clamshell with Resistance  GTB SLS  at counter Seated Heel Raise   Alternate Long Sitting Ankle Inversion withGTB   Pt sometimes with difficult with Right ankle on Left knee Long Sitting Ankle Dorsiflexion with RTB Updated HEP Manual Therapy: STW R plantar fascia R heel pad taping  Self Care: Timeline of healing.  Progressive overload and adaptability of tissues Shoe wear and heel cup silicone cups for comfort for prolonged standing    EVAL Therapeutic Exercise: Creating, reviewing, and completing below HEP   PATIENT EDUCATION ([place]/[facility]):  POC, diagnosis, prognosis, HEP, and outcome measures.  Pt educated via explanation, demonstration, and handout (HEP).  Pt confirms understanding verbally.   HOME EXERCISE PROGRAM: Access Code: [email or web address] URL: [email or web address] Date: 01/06/2024 Prepared by: Graydon Dingwall  Program Notes toe Yoga  Exercises - Seated Ankle Plantar Flexion with Resistance Loop  - 1-2 x daily - 7 x weekly - 5 reps - 30-45 second hold - Seated Plantar Fascia Stretch  - 1 x daily - 7 x weekly - 1 sets - 3 reps - Seated Arch Lifts  - 1 x daily - 7 x weekly - 3 sets - 10 reps - 5 hold - Seated Figure 4 Ankle Inversion with Resistance  - 1 x daily - 7 x weekly - 3 sets - 10 reps - Clamshell with Resistance  - 1-15 x daily - 7 x weekly - 2 sets - 10 reps - Single Leg Stance  - 1 x daily - 7 x weekly - 1 sets - 4-5 reps - 30  to 60 seconds hold - Seated Heel Raise  - 1 x daily - 7 x weekly - 3 sets - 10 reps - Long Sitting Ankle Inversion with Resistance  - 1 x daily - 7 x weekly - 3 sets - 10 reps - 5 hold - Long Sitting Ankle Dorsiflexion with Anchored Resistance  - 1 x daily - 7 x weekly - 3 sets - 10 reps - 5 hold Treatment priorities   Eval        Progressive loading as tolerated                                          ASSESSMENT:  CLINICAL IMPRESSION: Pt reports 8/10 pain continues. She gets relief from heel pad tape. Today she watched the application of tape and she  was given info on how to order. Next session, will plan to have her self tape. She was able to tolerate increased load to achilles today, using 10# KB on knee while seated. Ankle strength improved.     Eval Mackenzie [name] is a 53 y.o. female who presents to clinic with signs and sxs consistent with R posterior heel pain.  Consistent with physician impression of insertional achilles tendinopathy.   She is using a heel lift and nitro patches.  Tried eccentrics but this was too much load and agging.  Started with band isometrics and will progress as able.   Mackenzie [name] will benefit from skilled PT to address relevant deficits and improve comfort in walking and recreational activities.   OBJECTIVE IMPAIRMENTS: Pain, ankle ROM, gait, balance  ACTIVITY LIMITATIONS: walking, standing, housework, stairs  PERSONAL FACTORS: See medical history and pertinent history   REHAB POTENTIAL: Good  CLINICAL DECISION MAKING: Evolving/moderate complexity  EVALUATION COMPLEXITY: Moderate   GOALS:   SHORT TERM GOALS: Target date: 01/30/2024   Mackenzie [name] will be >75% HEP compliant to improve carryover between sessions and facilitate independent management of condition  Evaluation: ongoing Goal status: ONGOING   LONG TERM GOALS: Target date: 02/27/2024   Mackenzie [name] will self report >/= 50% decrease in pain from evaluation to improve function in daily  tasks  Evaluation/Baseline: 10/10 max pain Goal status: INITIAL   2.  Mackenzie [name] will show a >/= 20 pt improvement in LEFS score (MCID is ~11% or 9 pts) as a proxy for functional improvement   Evaluation/Baseline: 33/80 pts Goal status: INITIAL   3.  Mackenzie [name] will be able to complete yard work, including mowing her grass, not limited by pain  Evaluation/Baseline: limited Goal status: INITIAL   4.  Mackenzie [name] will be able to walk for 30 min, not limited by pain   Evaluation/Baseline: 10 min Goal status: INITIAL    5.  Mackenzie [name] will be able to perform at least partial ROM SL heel raise on R, not limited by pain  Evaluation/Baseline: unable Goal status: INITIAL    PLAN: PT FREQUENCY: 1-2x/week  PT DURATION: 8 weeks  PLANNED INTERVENTIONS:  [id number]- PT Re-evaluation, 97110-Therapeutic exercises, 97530- Therapeutic activity, [id number]- Neuromuscular re-education, 97535- Self Care, [id number]- Manual therapy, [id number]- Gait training, [id number]- Aquatic Therapy, [phone]- Electrical stimulation (manual), [id number]- Vasopneumatic device, [id number]- Traction (mechanical), [id number]- Ionotophoresis 4mg /ml Dexamethasone , Taping, Dry Needling, Joint manipulation, and Spinal manipulation.   Harlene Persons, PTA 01/20/24 12:22 PM

## 2024-01-20 NOTE — Therapy (Signed)
 OUTPATIENT PHYSICAL THERAPY LOWER EXTREMITY TREATMENT   Patient Name: Mackenzie [name] MRN: [id number] DOB:Nov 02, 1970, 53 y.o., female Today's Date: 01/20/2024   PT End of Session - 01/20/24 1022     Visit Number 4    Date for PT Re-Evaluation 02/27/24    Authorization Type [employer] [facility]    PT Start Time 1022    PT Stop Time 1100    PT Time Calculation (min) 38 min    Activity Tolerance Patient tolerated treatment well    Behavior During Therapy [facility] for tasks assessed/performed            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, left (HCC) 01/23/2017   Cellulitis of right leg    Edema, lower extremity    GERD (gastroesophageal reflux disease)    Healthcare maintenance 03/25/2017   Herpesviral infection    Hot flashes 03/2017   Hyperlipidemia 03/20/2016   Impaired fasting glucose    Morbid obesity with body mass index (BMI) of 40.0 to 49.9 (HCC) 09/17/2017   Neoplasm of cervix    Osteoarthritis of knee    Other chest pain    Other specified disorders of veins    Personal history of chemotherapy    Personal history of radiation therapy    Polyneuropathy    Postmenopausal atrophic vaginitis    Pre-diabetes 07/26/2018   Hgb A1C elevated on 07/26/2018, Gestational Diabetes 2012   Pure hypercholesterolemia 03/30/2023   Seasonal allergies 2012   seasonal allergies causes allergic rhinitis and itchy, dry eyes per pt   Stress incontinence    Tachycardia    Thyrotoxicosis    Vitamin D  deficiency 05/2015   Past Surgical History:  Procedure Laterality Date   BREAST RECONSTRUCTION WITH PLACEMENT OF TISSUE EXPANDER AND FLEX HD (ACELLULAR HYDRATED DERMIS) Left 08/27/2017   Procedure: LEFT BREAST  RECONSTRUCTION WITH PLACEMENT OF TISSUE EXPANDER AND FLEX HD;  Surgeon: Lowery Estefana RAMAN, DO;  Location: [facility];  Service: Plastics;  Laterality: Left;   CESAREAN SECTION     x2   KNEE ARTHROSCOPY WITH MEDIAL MENISECTOMY Right 11/12/2022   Procedure: RIGHT KNEE ARTHROSCOPY, PARTIAL MEDIAL MENISCECTOMY;  Surgeon: Jerri Kay HERO, MD;  Location: [facility];  Service: Orthopedics;  Laterality: Right;   LATISSIMUS FLAP TO BREAST Left 11/03/2018   LATISSIMUS FLAP TO BREAST Left 11/03/2018   Procedure: LATISSIMUS FLAP TO LEFT BREAST;  Surgeon: Lowery Estefana RAMAN, DO;  Location: [facility];  Service: Plastics;  Laterality: Left;   MASTECTOMY MODIFIED RADICAL Left 08/27/2017   MASTECTOMY MODIFIED RADICAL Left 08/27/2017   Procedure: LEFT MODIFIED RADICAL MASTECTOMY;  Surgeon: Vanderbilt Ned, MD;  Location: [facility];  Service: General;  Laterality: Left;   MASTOPEXY Right 03/03/2019   Procedure: RIGHT BREAST MASTOPEXY/REDUCTION;  Surgeon: Lowery Estefana RAMAN, DO;  Location: [facility];  Service: Plastics;  Laterality: Right;  3 hours, please   PORT-A-CATH REMOVAL Right 08/27/2017   Procedure: REMOVAL PORT-A-CATH RIGHT CHEST;  Surgeon: Vanderbilt Ned, MD;  Location: [facility];  Service: General;  Laterality: Right;   PORTACATH PLACEMENT Right 01/28/2017   Procedure: INSERTION PORT-A-CATH WITH ULTRASOUND;  Surgeon: Vanderbilt Ned, MD;  Location: [facility];  Service: General;  Laterality: Right;   REDUCTION MAMMAPLASTY Right    REMOVAL OF TISSUE EXPANDER AND PLACEMENT OF IMPLANT Left 03/03/2019   Procedure: REMOVAL OF TISSUE EXPANDER AND PLACEMENT OF EXPANDER;  Surgeon: Lowery Estefana RAMAN, DO;  Location: [facility];  Service: Plastics;  Laterality: Left;   REMOVAL OF TISSUE EXPANDER AND PLACEMENT OF IMPLANT Left 05/25/2019   Procedure: LEFT BREAST REMOVAL OF TISSUE EXPANDER AND PLACEMENT OF IMPLANT;  Surgeon: Lowery Estefana RAMAN, DO;  Location: [facility];  Service: Plastics;  Laterality: Left;   TISSUE EXPANDER PLACEMENT Left 11/03/2018   Procedure: PLACEMENT OF TISSUE EXPANDER LEFT BREAST;  Surgeon: Lowery Estefana RAMAN, DO;  Location: [facility];  Service: Plastics;  Laterality: Left;  Total case time is 3.5 hours   TUBAL LIGATION Bilateral 01/21/2011   Patient Active Problem List   Diagnosis Date Noted   SOBOE (shortness of breath on exertion) 11/11/2023   Primary osteoarthritis of left knee 10/09/2023   Achilles tendinitis, right leg 10/09/2023   Graves disease 07/16/2023   Multinodular goiter 07/16/2023   Right carpal tunnel syndrome 07/10/2023   Left carpal tunnel syndrome 07/10/2023   Pain of Right Knee 07/06/2023   History of breast reconstruction 05/15/2023   Pure hypercholesterolemia 03/30/2023   Acute meniscal tear, medial, right, initial encounter 11/12/2022   Plica syndrome of right knee 11/12/2022   Chondromalacia, right knee 11/12/2022   Depression 08/13/2022   BMI 40.0-44.9, adult (HCC) 08/13/2022   Obesity, Beginning BMI 08/13/2022   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   Boil of buttock, Left 12/24/2021   Hyperthyroidism 07/22/2021   Low serum thyroid  stimulating hormone (TSH) 06/12/2021   Other fatigue 05/23/2021   Hemoglobin low 05/23/2021   Polyphagia 05/16/2020   Vitamin D  deficiency 05/16/2020   At risk for osteoporosis 05/16/2020   Prediabetes 05/01/2020   Elevated blood pressure reading 05/01/2020   At risk for impaired metabolic function 05/01/2020   Breast asymmetry following reconstructive surgery 09/20/2019   Essential hypertension 08/17/2018   Class 3 severe obesity with serious comorbidity and body mass index (BMI) of 40.0 to 44.9 in adult 09/17/2017   Acquired absence of left breast 09/15/2017   Malignant neoplasm of upper-inner quadrant of left breast in female, 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   Healthcare maintenance 03/25/2017   Malignant neoplasm of  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, estrogen receptor positive (HCC) 01/23/2017   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 No, Number of falls: 0  MOI/History of condition:  Onset date: January  SUBJECTIVE STATEMENT  01-20-24 Tonnia enters clinic with  8/10 pain in Right heel. Heel pad taping works well.   EVAL Dorenda C Ambrose is a 53 y.o. female who presents to clinic with chief complaint of R posterior ankle pain.  Started in January with no specific inciting event.  Has slowly worsened since that time.  The has used a boot as needed, she is using nitroglycerine patches, she ices.  She has some modest improvement but nothing significant.  Attempted eccentrics which were too painful    Pain:  Are you having pain? 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  Education administrator: NA  Hand Dominance: NA  Patient Goals/Specific Activities: improved comfort with walking (10 min currently, was walking up to 3 miles/day), yard work   OBJECTIVE:   DIAGNOSTIC FINDINGS:  None relevant  GENERAL OBSERVATION/GAIT:  Antalgic gait with reduced time in stance on R  PALPATION: TTP insertion of R achilles tendon  LE MMT:  MMT  Right (Eval) Left (Eval) Right 01/20/24  Hip flexion (L2, L3)     Knee extension (L3) 4 4   Knee flexion 4 4   Hip abduction     Hip extension     Hip external rotation     Hip internal rotation     Hip adduction     Ankle dorsiflexion (L4) 5 5   Ankle plantarflexion (S1) Partial ROM SL eel raise unable   Ankle inversion 4 5 5   Ankle eversion 4 5 5   Great Toe ext (L5)     Grossly      (Blank rows = not tested, score listed is out of 5 possible points OR may be listed in lbs of force.  N = WNL, D = diminished, C = clear for gross weakness with myotome testing, * = concordant pain with testing)  LE ROM:  ROM Right (Eval) Left (Eval)  Hip flexion    Hip extension    Hip abduction    Hip adduction    Hip internal rotation    Hip external rotation    Knee extension    Knee flexion    Ankle dorsiflexion 10 10  Ankle plantarflexion 50 50  Ankle inversion 35 35  Ankle eversion 20 20   (Blank rows = not tested, N = WNL, * = concordant pain with testing)  Functional Tests  Eval    Progressive balance screen (highest level completed for >/= 10''):  Feet together: 10'' Semi Tandem: R in rear 10'', L in rear 10'' Tandem: R in rear 4'', L in rear 8'' SLS: R unable, L unable                                                          PATIENT SURVEYS:  LEFS: 33/80  TODAY'S TREATMENT: [facility] Adult PT Treatment:                                                DATE: 01/20/24 Therapeutic Exercise: Seated BTB  Seated heel raise , 10 sec holds Seated heel raise on 2 inch step , 5 sec x 10 , added 10# to knee x 40  Supine DF GTB x 10 Right heel pad taping.      [facility] Adult PT Treatment:  DATE: 01-14-24 Therapeutic Exercise:  Reviewed HEP in detail with PT/pt demo Toe yoga Right Seated Ankle Plantar Flexion  with GTB Seated Plantar Fascia Stretch   Seated Arch Lifts and standing Seated Figure 4 Ankle Inversion with GTB    Clamshell with Resistance  GTB SLS  with UE on back of chair Seated Heel Raise   Alternate Long Sitting Ankle Inversion withGTB   Pt sometimes with difficult with Right ankle on Left knee Long Sitting Ankle Dorsiflexion with RTB Manual Therapy: STW R plantar fascia R heel pad taping  [facility] Adult PT Treatment:                                                DATE: 01-06-24 Therapeutic Exercise: Seated Ankle Plantar Flexion  with GTB Seated Plantar Fascia Stretch   Seated Arch Lifts  Seated Figure 4 Ankle Inversion with GTB   Clamshell with Resistance  GTB SLS  at counter Seated Heel Raise   Alternate Long Sitting Ankle Inversion withGTB   Pt sometimes with difficult with Right ankle on Left knee Long Sitting Ankle Dorsiflexion with RTB Updated HEP Manual Therapy: STW R plantar fascia R heel pad taping  Self Care: Timeline of healing.  Progressive overload and adaptability of tissues Shoe wear and heel cup silicone cups for comfort for prolonged standing    EVAL Therapeutic Exercise: Creating, reviewing, and completing below HEP   PATIENT EDUCATION ([place]/[facility]):  POC, diagnosis, prognosis, HEP, and outcome measures.  Pt educated via explanation, demonstration, and handout (HEP).  Pt confirms understanding verbally.   HOME EXERCISE PROGRAM: Access Code: [email or web address] URL: [email or web address] Date: 01/06/2024 Prepared by: Graydon Dingwall  Program Notes toe Yoga  Exercises - Seated Ankle Plantar Flexion with Resistance Loop  - 1-2 x daily - 7 x weekly - 5 reps - 30-45 second hold - Seated Plantar Fascia Stretch  - 1 x daily - 7 x weekly - 1 sets - 3 reps - Seated Arch Lifts  - 1 x daily - 7 x weekly - 3 sets - 10 reps - 5 hold - Seated Figure 4 Ankle Inversion with Resistance  - 1 x daily - 7 x weekly - 3 sets - 10 reps - Clamshell with Resistance  - 1-15 x daily - 7 x weekly - 2 sets - 10 reps - Single Leg Stance  - 1 x daily - 7 x weekly - 1 sets - 4-5 reps - 30  to 60 seconds hold - Seated Heel Raise  - 1 x daily - 7 x weekly - 3 sets - 10 reps - Long Sitting Ankle Inversion with Resistance  - 1 x daily - 7 x weekly - 3 sets - 10 reps - 5 hold - Long Sitting Ankle Dorsiflexion with Anchored Resistance  - 1 x daily - 7 x weekly - 3 sets - 10 reps - 5 hold Treatment priorities   Eval        Progressive loading as tolerated                                          ASSESSMENT:  CLINICAL IMPRESSION: Pt reports 8/10 pain continues. She gets relief from heel pad tape. Today she watched the application of tape and she  was given info on how to order. Next session, will plan to have her self tape. She was able to tolerate increased load to achilles today, using 10# KB on knee while seated. Ankle strength improved.     Eval Bhumi is a 53 y.o. female who presents to clinic with signs and sxs consistent with R posterior heel pain.  Consistent with physician impression of insertional achilles tendinopathy.   She is using a heel lift and nitro patches.  Tried eccentrics but this was too much load and agging.  Started with band isometrics and will progress as able.   Deyanna will benefit from skilled PT to address relevant deficits and improve comfort in walking and recreational activities.   OBJECTIVE IMPAIRMENTS: Pain, ankle ROM, gait, balance  ACTIVITY LIMITATIONS: walking, standing, housework, stairs  PERSONAL FACTORS: See medical history and pertinent history   REHAB POTENTIAL: Good  CLINICAL DECISION MAKING: Evolving/moderate complexity  EVALUATION COMPLEXITY: Moderate   GOALS:   SHORT TERM GOALS: Target date: 01/30/2024   Zerah will be >75% HEP compliant to improve carryover between sessions and facilitate independent management of condition  Evaluation: ongoing Goal status: ONGOING   LONG TERM GOALS: Target date: 02/27/2024   Nateisha will self report >/= 50% decrease in pain from evaluation to improve function in daily  tasks  Evaluation/Baseline: 10/10 max pain Goal status: INITIAL   2.  Dasie will show a >/= 20 pt improvement in LEFS score (MCID is ~11% or 9 pts) as a proxy for functional improvement   Evaluation/Baseline: 33/80 pts Goal status: INITIAL   3.  Rina will be able to complete yard work, including mowing her grass, not limited by pain  Evaluation/Baseline: limited Goal status: INITIAL   4.  Syliva will be able to walk for 30 min, not limited by pain   Evaluation/Baseline: 10 min Goal status: INITIAL    5.  Shima will be able to perform at least partial ROM SL heel raise on R, not limited by pain  Evaluation/Baseline: unable Goal status: INITIAL    PLAN: PT FREQUENCY: 1-2x/week  PT DURATION: 8 weeks  PLANNED INTERVENTIONS:  [id number]- PT Re-evaluation, 97110-Therapeutic exercises, 97530- Therapeutic activity, [id number]- Neuromuscular re-education, 97535- Self Care, [id number]- Manual therapy, [id number]- Gait training, [id number]- Aquatic Therapy, [id number]- Electrical stimulation (manual), [id number]- Vasopneumatic device, [id number]- Traction (mechanical), [id number]- Ionotophoresis 4mg /ml Dexamethasone , Taping, Dry Needling, Joint manipulation, and Spinal manipulation.   Harlene Persons, PTA 01/20/24 12:22 PM

## 2024-01-21 ENCOUNTER — Other Ambulatory Visit (HOSPITAL_COMMUNITY): Payer: Self-pay

## 2024-01-22 ENCOUNTER — Encounter (HOSPITAL_COMMUNITY): Payer: Self-pay | Admitting: Pharmacist

## 2024-01-22 ENCOUNTER — Ambulatory Visit: Admitting: Physical Therapy

## 2024-01-22 ENCOUNTER — Encounter: Payer: Self-pay | Admitting: Physical Therapy

## 2024-01-22 ENCOUNTER — Other Ambulatory Visit (HOSPITAL_COMMUNITY): Payer: Self-pay

## 2024-01-22 DIAGNOSIS — R600 Localized edema: Secondary | ICD-10-CM | POA: Diagnosis not present

## 2024-01-22 DIAGNOSIS — Z483 Aftercare following surgery for neoplasm: Secondary | ICD-10-CM | POA: Diagnosis not present

## 2024-01-22 DIAGNOSIS — M25571 Pain in right ankle and joints of right foot: Secondary | ICD-10-CM | POA: Diagnosis not present

## 2024-01-22 DIAGNOSIS — M25561 Pain in right knee: Secondary | ICD-10-CM | POA: Diagnosis not present

## 2024-01-22 DIAGNOSIS — G8929 Other chronic pain: Secondary | ICD-10-CM | POA: Diagnosis not present

## 2024-01-22 DIAGNOSIS — R2689 Other abnormalities of gait and mobility: Secondary | ICD-10-CM | POA: Diagnosis not present

## 2024-01-22 DIAGNOSIS — M6281 Muscle weakness (generalized): Secondary | ICD-10-CM | POA: Diagnosis not present

## 2024-01-22 DIAGNOSIS — M25671 Stiffness of right ankle, not elsewhere classified: Secondary | ICD-10-CM | POA: Diagnosis not present

## 2024-01-22 NOTE — Therapy (Signed)
 OUTPATIENT PHYSICAL THERAPY LOWER EXTREMITY TREATMENT   Patient Name: Mackenzie [name] MRN: [id number] DOB:05/09/1971, 53 y.o., female Today's Date: 01/22/2024   PT End of Session - 01/22/24 0820     Visit Number 5    Date for PT Re-Evaluation 02/27/24    Authorization Type [employer]    PT Start Time [phone]   pt late   PT Stop Time 0845    PT Time Calculation (min) 29 min            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, left (HCC) 01/23/2017   Cellulitis of right leg    Edema, lower extremity    GERD (gastroesophageal reflux disease)    Healthcare maintenance 03/25/2017   Herpesviral infection    Hot flashes 03/2017   Hyperlipidemia 03/20/2016   Impaired fasting glucose    Morbid obesity with body mass index (BMI) of 40.0 to 49.9 (HCC) 09/17/2017   Neoplasm of cervix    Osteoarthritis of knee    Other chest pain    Other specified disorders of veins    Personal history of chemotherapy    Personal history of radiation therapy    Polyneuropathy    Postmenopausal atrophic vaginitis    Pre-diabetes 07/26/2018   Hgb A1C elevated on 07/26/2018, Gestational Diabetes 2012   Pure hypercholesterolemia 03/30/2023   Seasonal allergies 2012   seasonal allergies causes allergic rhinitis and itchy, dry eyes per pt   Stress incontinence    Tachycardia    Thyrotoxicosis    Vitamin D  deficiency 05/2015   Past Surgical History:  Procedure Laterality Date   BREAST RECONSTRUCTION WITH PLACEMENT OF TISSUE EXPANDER AND FLEX HD (ACELLULAR HYDRATED DERMIS) Left 08/27/2017   Procedure: LEFT BREAST RECONSTRUCTION WITH PLACEMENT OF TISSUE EXPANDER AND FLEX HD;  Surgeon: Lowery Estefana RAMAN, DO;  Location:  [facility];  Service: Plastics;  Laterality: Left;   CESAREAN SECTION     x2   KNEE ARTHROSCOPY WITH MEDIAL MENISECTOMY Right 11/12/2022   Procedure: RIGHT KNEE ARTHROSCOPY, PARTIAL MEDIAL MENISCECTOMY;  Surgeon: Jerri Kay HERO, MD;  Location: [facility];  Service: Orthopedics;  Laterality: Right;   LATISSIMUS FLAP TO BREAST Left 11/03/2018   LATISSIMUS FLAP TO BREAST Left 11/03/2018   Procedure: LATISSIMUS FLAP TO LEFT BREAST;  Surgeon: Lowery Estefana RAMAN, DO;  Location: [facility];  Service: Plastics;  Laterality: Left;   MASTECTOMY MODIFIED RADICAL Left 08/27/2017   MASTECTOMY MODIFIED RADICAL Left 08/27/2017   Procedure: LEFT MODIFIED RADICAL MASTECTOMY;  Surgeon: Vanderbilt Ned, MD;  Location: [facility];  Service: General;  Laterality: Left;   MASTOPEXY Right 03/03/2019   Procedure: RIGHT BREAST MASTOPEXY/REDUCTION;  Surgeon: Lowery Estefana RAMAN, DO;  Location: [facility];  Service: Plastics;  Laterality: Right;  3 hours, please   PORT-A-CATH REMOVAL Right 08/27/2017   Procedure: REMOVAL PORT-A-CATH RIGHT CHEST;  Surgeon: Vanderbilt Ned, MD;  Location: [facility];  Service: General;  Laterality: Right;   PORTACATH PLACEMENT Right 01/28/2017   Procedure: INSERTION PORT-A-CATH WITH ULTRASOUND;  Surgeon: Vanderbilt Ned, MD;  Location: [facility];  Service: General;  Laterality: Right;   REDUCTION MAMMAPLASTY Right    REMOVAL  OF TISSUE EXPANDER AND PLACEMENT OF IMPLANT Left 03/03/2019   Procedure: REMOVAL OF TISSUE EXPANDER AND PLACEMENT OF EXPANDER;  Surgeon: Lowery Estefana RAMAN, DO;  Location: [facility];  Service: Plastics;  Laterality: Left;   REMOVAL OF TISSUE EXPANDER AND PLACEMENT OF IMPLANT Left 05/25/2019   Procedure: LEFT BREAST REMOVAL OF TISSUE EXPANDER AND PLACEMENT OF IMPLANT;  Surgeon: Lowery Estefana RAMAN, DO;  Location: [facility];  Service: Plastics;  Laterality: Left;   TISSUE EXPANDER PLACEMENT Left 11/03/2018    Procedure: PLACEMENT OF TISSUE EXPANDER LEFT BREAST;  Surgeon: Lowery Estefana RAMAN, DO;  Location: [facility];  Service: Plastics;  Laterality: Left;  Total case time is 3.5 hours   TUBAL LIGATION Bilateral 01/21/2011   Patient Active Problem List   Diagnosis Date Noted   SOBOE (shortness of breath on exertion) 11/11/2023   Primary osteoarthritis of left knee 10/09/2023   Achilles tendinitis, right leg 10/09/2023   Graves disease 07/16/2023   Multinodular goiter 07/16/2023   Right carpal tunnel syndrome 07/10/2023   Left carpal tunnel syndrome 07/10/2023   Pain of Right Knee 07/06/2023   History of breast reconstruction 05/15/2023   Pure hypercholesterolemia 03/30/2023   Acute meniscal tear, medial, right, initial encounter 11/12/2022   Plica syndrome of right knee 11/12/2022   Chondromalacia, right knee 11/12/2022   Depression 08/13/2022   BMI 40.0-44.9, adult (HCC) 08/13/2022   Obesity, Beginning BMI 08/13/2022   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   Boil of buttock, Left 12/24/2021   Hyperthyroidism 07/22/2021   Low serum thyroid  stimulating hormone (TSH) 06/12/2021   Other fatigue 05/23/2021   Hemoglobin low 05/23/2021   Polyphagia 05/16/2020   Vitamin D  deficiency 05/16/2020   At risk for osteoporosis 05/16/2020   Prediabetes 05/01/2020   Elevated blood pressure reading 05/01/2020   At risk for impaired metabolic function 05/01/2020   Breast asymmetry following reconstructive surgery 09/20/2019   Essential hypertension 08/17/2018   Class 3 severe obesity with serious comorbidity and body mass index (BMI) of 40.0 to 44.9 in adult 09/17/2017   Acquired absence of left breast 09/15/2017   Malignant neoplasm of upper-inner quadrant of left breast in female, 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   Healthcare maintenance 03/25/2017   Malignant 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, estrogen receptor positive (HCC) 01/23/2017   Carcinoma of breast  metastatic to axillary lymph node, left (HCC) 01/23/2017    PCP: [name], MD  REFERRING PROVIDER: [name], MD  THERAPY DIAG:  Aftercare following surgery for neoplasm  Pain in right ankle and joints of right foot  REFERRING DIAG: Right Achilles tendinitis [M76.61]   Rationale for Evaluation and Treatment:  Rehabilitation  SUBJECTIVE:  PERTINENT PAST HISTORY:  Hx of breast cancer, R knee pain, L knee pain      PRECAUTIONS: None  WEIGHT BEARING RESTRICTIONS No  FALLS:  Has patient fallen in last 6 months? No, Number of falls: 0  MOI/History of condition:  Onset date: January  SUBJECTIVE STATEMENT  01-22-24 Adayah enters clinic with 2/10 pain reports less pain last 2 days but has not been to work.   EVAL Jasmaine C Bunte is a 53 y.o. female who presents to clinic with chief complaint of R posterior ankle pain.  Started in January with no specific inciting event.  Has slowly worsened since that time.  The has used a boot as needed, she is using nitroglycerine patches, she ices.  She has some modest improvement  but nothing significant.  Attempted eccentrics which were too painful.     Pain:  Are you having pain? 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  Education administrator: NA  Hand Dominance: NA  Patient Goals/Specific Activities: improved comfort with walking (10 min currently, was walking up to 3 miles/day), yard work   OBJECTIVE:   DIAGNOSTIC FINDINGS:  None relevant  GENERAL OBSERVATION/GAIT:  Antalgic gait with reduced time in stance on R  PALPATION: TTP insertion of R achilles tendon  LE MMT:  MMT Right (Eval) Left (Eval) Right 01/20/24  Hip flexion (L2, L3)     Knee extension (L3) 4 4   Knee flexion 4 4   Hip abduction     Hip extension      Hip external rotation     Hip internal rotation     Hip adduction     Ankle dorsiflexion (L4) 5 5   Ankle plantarflexion (S1) Partial ROM SL eel raise unable   Ankle inversion 4 5 5   Ankle eversion 4 5 5   Great Toe ext (L5)     Grossly      (Blank rows = not tested, score listed is out of 5 possible points OR may be listed in lbs of force.  N = WNL, D = diminished, C = clear for gross weakness with myotome testing, * = concordant pain with testing)  LE ROM:  ROM Right (Eval) Left (Eval)  Hip flexion    Hip extension    Hip abduction    Hip adduction    Hip internal rotation    Hip external rotation    Knee extension    Knee flexion    Ankle dorsiflexion 10 10  Ankle plantarflexion 50 50  Ankle inversion 35 35  Ankle eversion 20 20   (Blank rows = not tested, N = WNL, * = concordant pain with testing)  Functional Tests  Eval 01/22/24   Progressive balance screen (highest level completed for >/= 10''):  Feet together: 10'' Semi Tandem: R in rear 10'', L in rear 10'' Tandem: R in rear 4'', L in rear 8'' SLS: R unable, L unable  SLS 28 sec right                                                         PATIENT SURVEYS:  LEFS: 33/80  TODAY'S TREATMENT: [facility] Adult PT Treatment:                                                DATE: 01/22/24 Therapeutic Exercise: Seated toe scrunches and toe yoga Seated BTB PF x 20 Seated Inv GTB x 20 Seated gastroc and soleus stretch with towel  Seated heel raise on 2 inch step, 15# KB ,  5 sec x 10, 10 sec x 10  SLS 28 sec , min increased pain.  Right heel pad taping.       [facility] Adult PT Treatment:  DATE: 01/20/24 Therapeutic Exercise: Seated BTB PF Seated heel raise , 10 sec holds Seated heel raise on 2 inch step , 5 sec x 10 , added 10# to knee x 40  Supine DF GTB x 10 Right heel pad taping.      [facility] Adult PT Treatment:                                                 DATE: 01-14-24 Therapeutic Exercise:  Reviewed HEP in detail with PT/pt demo Toe yoga Right Seated Ankle Plantar Flexion  with GTB Seated Plantar Fascia Stretch   Seated Arch Lifts and standing Seated Figure 4 Ankle Inversion with GTB   Clamshell with Resistance  GTB SLS  with UE on back of chair Seated Heel Raise   Alternate Long Sitting Ankle Inversion withGTB   Pt sometimes with difficult with Right ankle on Left knee Long Sitting Ankle Dorsiflexion with RTB Manual Therapy: STW R plantar fascia R heel pad taping  [facility] Adult PT Treatment:                                                DATE: 01-06-24 Therapeutic Exercise: Seated Ankle Plantar Flexion  with GTB Seated Plantar Fascia Stretch   Seated Arch Lifts  Seated Figure 4 Ankle Inversion with GTB   Clamshell with Resistance  GTB SLS  at counter Seated Heel Raise   Alternate Long Sitting Ankle Inversion withGTB   Pt sometimes with difficult with Right ankle on Left knee Long Sitting Ankle Dorsiflexion with RTB Updated HEP Manual Therapy: STW R plantar fascia R heel pad taping  Self Care: Timeline of healing.  Progressive overload and adaptability of tissues Shoe wear and heel cup silicone cups for comfort for prolonged standing    EVAL Therapeutic Exercise: Creating, reviewing, and completing below HEP   PATIENT EDUCATION ([place]/[facility]):  POC, diagnosis, prognosis, HEP, and outcome measures.  Pt educated via explanation, demonstration, and handout (HEP).  Pt confirms understanding verbally.   HOME EXERCISE PROGRAM: Access Code: [email or web address] URL: [email or web address] Date: 01/06/2024 Prepared by: Graydon Dingwall  Program Notes toe Yoga  Exercises - Seated Ankle Plantar Flexion with Resistance Loop  - 1-2 x daily - 7 x weekly - 5 reps - 30-45 second hold - Seated Plantar Fascia Stretch  - 1 x daily - 7 x weekly - 1 sets - 3 reps - Seated Arch Lifts  - 1 x daily - 7 x weekly - 3 sets - 10 reps - 5  hold - Seated Figure 4 Ankle Inversion with Resistance  - 1 x daily - 7 x weekly - 3 sets - 10 reps - Clamshell with Resistance  - 1-15 x daily - 7 x weekly - 2 sets - 10 reps - Single Leg Stance  - 1 x daily - 7 x weekly - 1 sets - 4-5 reps - 30 to 60 seconds hold - Seated Heel Raise  - 1 x daily - 7 x weekly - 3 sets - 10 reps - Long Sitting Ankle Inversion with Resistance  - 1 x daily - 7 x weekly - 3 sets - 10 reps - 5 hold - Long Sitting Ankle Dorsiflexion with Anchored Resistance  -  1 x daily - 7 x weekly - 3 sets - 10 reps - 5 hold Treatment priorities   Eval        Progressive loading as tolerated                                          ASSESSMENT:  CLINICAL IMPRESSION: Pt reports decreased pain last 2 days while not working. She was able to tolerate increased load to achilles today, using 15# KB on knee while seated. Retaped heel per pt request. Shortened session due to pt tardiness.     Eval Jami is a 53 y.o. female who presents to clinic with signs and sxs consistent with R posterior heel pain.  Consistent with physician impression of insertional achilles tendinopathy.   She is using a heel lift and nitro patches.  Tried eccentrics but this was too much load and agging.  Started with band isometrics and will progress as able.   Sydnee will benefit from skilled PT to address relevant deficits and improve comfort in walking and recreational activities.   OBJECTIVE IMPAIRMENTS: Pain, ankle ROM, gait, balance  ACTIVITY LIMITATIONS: walking, standing, housework, stairs  PERSONAL FACTORS: See medical history and pertinent history   REHAB POTENTIAL: Good  CLINICAL DECISION MAKING: Evolving/moderate complexity  EVALUATION COMPLEXITY: Moderate   GOALS:   SHORT TERM GOALS: Target date: 01/30/2024   Khyra will be >75% HEP compliant to improve carryover between sessions and facilitate independent management of condition  Evaluation: ongoing Goal status:  ONGOING   LONG TERM GOALS: Target date: 02/27/2024   Madelene will self report >/= 50% decrease in pain from evaluation to improve function in daily tasks  Evaluation/Baseline: 10/10 max pain Goal status: INITIAL   2.  Kearston will show a >/= 20 pt improvement in LEFS score (MCID is ~11% or 9 pts) as a proxy for functional improvement   Evaluation/Baseline: 33/80 pts Goal status: INITIAL   3.  Kiera will be able to complete yard work, including mowing her grass, not limited by pain  Evaluation/Baseline: limited Goal status: INITIAL   4.  Tykisha will be able to walk for 30 min, not limited by pain   Evaluation/Baseline: 10 min Goal status: INITIAL    5.  Alysse will be able to perform at least partial ROM SL heel raise on R, not limited by pain  Evaluation/Baseline: unable Goal status: INITIAL    PLAN: PT FREQUENCY: 1-2x/week  PT DURATION: 8 weeks  PLANNED INTERVENTIONS:  [id number]- PT Re-evaluation, 97110-Therapeutic exercises, 97530- Therapeutic activity, [id number]- Neuromuscular re-education, 97535- Self Care, [id number]- Manual therapy, [id number]- Gait training, [id number]- Aquatic Therapy, [phone]- Electrical stimulation (manual), [id number]- Vasopneumatic device, [id number]- Traction (mechanical), [id number]- Ionotophoresis 4mg /ml Dexamethasone , Taping, Dry Needling, Joint manipulation, and Spinal manipulation.   Harlene Persons, PTA 01/22/24 12:54 PM Phone: [phone] Fax: [phone]

## 2024-01-25 ENCOUNTER — Encounter: Payer: Self-pay | Admitting: Physical Therapy

## 2024-01-25 ENCOUNTER — Ambulatory Visit: Admitting: Physical Therapy

## 2024-01-25 DIAGNOSIS — R2689 Other abnormalities of gait and mobility: Secondary | ICD-10-CM | POA: Diagnosis not present

## 2024-01-25 DIAGNOSIS — M25561 Pain in right knee: Secondary | ICD-10-CM | POA: Diagnosis not present

## 2024-01-25 DIAGNOSIS — Z483 Aftercare following surgery for neoplasm: Secondary | ICD-10-CM

## 2024-01-25 DIAGNOSIS — R600 Localized edema: Secondary | ICD-10-CM | POA: Diagnosis not present

## 2024-01-25 DIAGNOSIS — M6281 Muscle weakness (generalized): Secondary | ICD-10-CM | POA: Diagnosis not present

## 2024-01-25 DIAGNOSIS — M25571 Pain in right ankle and joints of right foot: Secondary | ICD-10-CM

## 2024-01-25 DIAGNOSIS — M25671 Stiffness of right ankle, not elsewhere classified: Secondary | ICD-10-CM | POA: Diagnosis not present

## 2024-01-25 DIAGNOSIS — G8929 Other chronic pain: Secondary | ICD-10-CM | POA: Diagnosis not present

## 2024-01-25 NOTE — Therapy (Signed)
 OUTPATIENT PHYSICAL THERAPY LOWER EXTREMITY TREATMENT   Patient Name: Mackenzie [name] MRN: [id number] DOB:August 21, 1970, 53 y.o., female Today's Date: 01/25/2024   PT End of Session - 01/25/24 0903     Visit Number 6    Date for PT Re-Evaluation 02/27/24    Authorization Type [employer] [facility]    PT Start Time 0900   15 minutes late   PT Stop Time 0930    PT Time Calculation (min) 30 min            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, left (HCC) 01/23/2017   Cellulitis of right leg    Edema, lower extremity    GERD (gastroesophageal reflux disease)    Healthcare maintenance 03/25/2017   Herpesviral infection    Hot flashes 03/2017   Hyperlipidemia 03/20/2016   Impaired fasting glucose    Morbid obesity with body mass index (BMI) of 40.0 to 49.9 (HCC) 09/17/2017   Neoplasm of cervix    Osteoarthritis of knee    Other chest pain    Other specified disorders of veins    Personal history of chemotherapy    Personal history of radiation therapy    Polyneuropathy    Postmenopausal atrophic vaginitis    Pre-diabetes 07/26/2018   Hgb A1C elevated on 07/26/2018, Gestational Diabetes 2012   Pure hypercholesterolemia 03/30/2023   Seasonal allergies 2012   seasonal allergies causes allergic rhinitis and itchy, dry eyes per pt   Stress incontinence    Tachycardia    Thyrotoxicosis    Vitamin D  deficiency 05/2015   Past Surgical History:  Procedure Laterality Date   BREAST RECONSTRUCTION WITH PLACEMENT OF TISSUE EXPANDER AND FLEX HD (ACELLULAR HYDRATED DERMIS) Left 08/27/2017   Procedure: LEFT BREAST RECONSTRUCTION WITH PLACEMENT OF TISSUE EXPANDER AND FLEX HD;  Surgeon: Lowery Estefana RAMAN, DO;   Location: [facility];  Service: Plastics;  Laterality: Left;   CESAREAN SECTION     x2   KNEE ARTHROSCOPY WITH MEDIAL MENISECTOMY Right 11/12/2022   Procedure: RIGHT KNEE ARTHROSCOPY, PARTIAL MEDIAL MENISCECTOMY;  Surgeon: Jerri Kay HERO, MD;  Location: [facility];  Service: Orthopedics;  Laterality: Right;   LATISSIMUS FLAP TO BREAST Left 11/03/2018   LATISSIMUS FLAP TO BREAST Left 11/03/2018   Procedure: LATISSIMUS FLAP TO LEFT BREAST;  Surgeon: Lowery Estefana RAMAN, DO;  Location: [facility];  Service: Plastics;  Laterality: Left;   MASTECTOMY MODIFIED RADICAL Left 08/27/2017   MASTECTOMY MODIFIED RADICAL Left 08/27/2017   Procedure: LEFT MODIFIED RADICAL MASTECTOMY;  Surgeon: Vanderbilt Ned, MD;  Location: [facility];  Service: General;  Laterality: Left;   MASTOPEXY Right 03/03/2019   Procedure: RIGHT BREAST MASTOPEXY/REDUCTION;  Surgeon: Lowery Estefana RAMAN, DO;  Location: [facility];  Service: Plastics;  Laterality: Right;  3 hours, please   PORT-A-CATH REMOVAL Right 08/27/2017   Procedure: REMOVAL PORT-A-CATH RIGHT CHEST;  Surgeon: Vanderbilt Ned, MD;  Location: [facility];  Service: General;  Laterality: Right;   PORTACATH PLACEMENT Right 01/28/2017   Procedure: INSERTION PORT-A-CATH WITH ULTRASOUND;  Surgeon: Vanderbilt Ned, MD;  Location: [facility];  Service: General;  Laterality: Right;   REDUCTION MAMMAPLASTY Right  REMOVAL OF TISSUE EXPANDER AND PLACEMENT OF IMPLANT Left 03/03/2019   Procedure: REMOVAL OF TISSUE EXPANDER AND PLACEMENT OF EXPANDER;  Surgeon: Lowery Estefana RAMAN, DO;  Location: [facility];  Service: Plastics;  Laterality: Left;   REMOVAL OF TISSUE EXPANDER AND PLACEMENT OF IMPLANT Left 05/25/2019   Procedure: LEFT BREAST REMOVAL OF TISSUE EXPANDER AND PLACEMENT OF IMPLANT;  Surgeon: Lowery Estefana RAMAN, DO;  Location: [facility];  Service: Plastics;  Laterality: Left;   TISSUE EXPANDER PLACEMENT Left 11/03/2018    Procedure: PLACEMENT OF TISSUE EXPANDER LEFT BREAST;  Surgeon: Lowery Estefana RAMAN, DO;  Location: [facility];  Service: Plastics;  Laterality: Left;  Total case time is 3.5 hours   TUBAL LIGATION Bilateral 01/21/2011   Patient Active Problem List   Diagnosis Date Noted   SOBOE (shortness of breath on exertion) 11/11/2023   Primary osteoarthritis of left knee 10/09/2023   Achilles tendinitis, right leg 10/09/2023   Graves disease 07/16/2023   Multinodular goiter 07/16/2023   Right carpal tunnel syndrome 07/10/2023   Left carpal tunnel syndrome 07/10/2023   Pain of Right Knee 07/06/2023   History of breast reconstruction 05/15/2023   Pure hypercholesterolemia 03/30/2023   Acute meniscal tear, medial, right, initial encounter 11/12/2022   Plica syndrome of right knee 11/12/2022   Chondromalacia, right knee 11/12/2022   Depression 08/13/2022   BMI 40.0-44.9, adult (HCC) 08/13/2022   Obesity, Beginning BMI 08/13/2022   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   Boil of buttock, Left 12/24/2021   Hyperthyroidism 07/22/2021   Low serum thyroid  stimulating hormone (TSH) 06/12/2021   Other fatigue 05/23/2021   Hemoglobin low 05/23/2021   Polyphagia 05/16/2020   Vitamin D  deficiency 05/16/2020   At risk for osteoporosis 05/16/2020   Prediabetes 05/01/2020   Elevated blood pressure reading 05/01/2020   At risk for impaired metabolic function 05/01/2020   Breast asymmetry following reconstructive surgery 09/20/2019   Essential hypertension 08/17/2018   Class 3 severe obesity with serious comorbidity and body mass index (BMI) of 40.0 to 44.9 in adult 09/17/2017   Acquired absence of left breast 09/15/2017   Malignant neoplasm of upper-inner quadrant of left breast in female, 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   Healthcare maintenance 03/25/2017   Malignant 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, estrogen receptor positive (HCC) 01/23/2017   Carcinoma of  breast metastatic to axillary lymph node, left (HCC) 01/23/2017    PCP: [name], MD  REFERRING PROVIDER: Cleatrice Ludie SAUNDERS, MD  THERAPY DIAG:  Aftercare following surgery for neoplasm  Pain in right ankle and joints of right foot  REFERRING DIAG: Right Achilles tendinitis [M76.61]   Rationale for Evaluation and Treatment:  Rehabilitation  SUBJECTIVE:  PERTINENT PAST HISTORY:  Hx of breast cancer, R knee pain, L knee pain      PRECAUTIONS: None  WEIGHT BEARING RESTRICTIONS No  FALLS:  Has patient fallen in last 6 months? No, Number of falls: 0  MOI/History of condition:  Onset date: January  SUBJECTIVE STATEMENT  01-25-24 Brunilda enters clinic with 5/10. Did well over the weekend with daughter's surprise party, but is having increased pain this morning.   EVAL Sabirin C Driver is a 53 y.o. female who presents to clinic with chief complaint of R posterior ankle pain.  Started in January with no specific inciting event.  Has slowly worsened since that time.  The has used a boot as needed, she is using nitroglycerine patches, she ices.  She  has some modest improvement but nothing significant.  Attempted eccentrics which were too painful.     Pain:  Are you having pain? 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  Education administrator: NA  Hand Dominance: NA  Patient Goals/Specific Activities: improved comfort with walking (10 min currently, was walking up to 3 miles/day), yard work   OBJECTIVE:   DIAGNOSTIC FINDINGS:  None relevant  GENERAL OBSERVATION/GAIT:  Antalgic gait with reduced time in stance on R  PALPATION: TTP insertion of R achilles tendon  LE MMT:  MMT Right (Eval) Left (Eval) Right 01/20/24  Hip flexion (L2, L3)     Knee extension (L3) 4 4   Knee  flexion 4 4   Hip abduction     Hip extension     Hip external rotation     Hip internal rotation     Hip adduction     Ankle dorsiflexion (L4) 5 5   Ankle plantarflexion (S1) Partial ROM SL eel raise unable   Ankle inversion 4 5 5   Ankle eversion 4 5 5   Great Toe ext (L5)     Grossly      (Blank rows = not tested, score listed is out of 5 possible points OR may be listed in lbs of force.  N = WNL, D = diminished, C = clear for gross weakness with myotome testing, * = concordant pain with testing)  LE ROM:  ROM Right (Eval) Left (Eval)  Hip flexion    Hip extension    Hip abduction    Hip adduction    Hip internal rotation    Hip external rotation    Knee extension    Knee flexion    Ankle dorsiflexion 10 10  Ankle plantarflexion 50 50  Ankle inversion 35 35  Ankle eversion 20 20   (Blank rows = not tested, N = WNL, * = concordant pain with testing)  Functional Tests  Eval 01/22/24   Progressive balance screen (highest level completed for >/= 10''):  Feet together: 10'' Semi Tandem: R in rear 10'', L in rear 10'' Tandem: R in rear 4'', L in rear 8'' SLS: R unable, L unable  SLS 28 sec right                                                         PATIENT SURVEYS:  LEFS: 33/80  TODAY'S TREATMENT: [facility] Adult PT Treatment:                                                DATE: 01/25/24 Therapeutic Exercise: Standing heel raise 5 x 2  Standing bil concentric to right eccentric x 5 Bilat heel raise hold 10 sec x 5  Standing gastroc stretch and soleus stretch x 15 sec   Neuromuscular re-ed: 21 sec SLS  right Tandem RLE back  50 sec  Self Care: Guided patient is self tape of heel pad   [facility] Adult PT Treatment:  DATE: 01/22/24 Therapeutic Exercise: Seated toe scrunches and toe yoga Seated BTB PF x 20 Seated Inv GTB x 20 Seated gastroc and soleus stretch with towel  Seated heel raise on 2 inch step, 15#  KB ,  5 sec x 10, 10 sec x 10  SLS 28 sec , min increased pain.  Right heel pad taping.       [facility] Adult PT Treatment:                                                DATE: 01/20/24 Therapeutic Exercise: Seated BTB PF Seated heel raise , 10 sec holds Seated heel raise on 2 inch step , 5 sec x 10 , added 10# to knee x 40  Supine DF GTB x 10 Right heel pad taping.      [facility] Adult PT Treatment:                                                DATE: 01-14-24 Therapeutic Exercise:  Reviewed HEP in detail with PT/pt demo Toe yoga Right Seated Ankle Plantar Flexion  with GTB Seated Plantar Fascia Stretch   Seated Arch Lifts and standing Seated Figure 4 Ankle Inversion with GTB   Clamshell with Resistance  GTB SLS  with UE on back of chair Seated Heel Raise   Alternate Long Sitting Ankle Inversion withGTB   Pt sometimes with difficult with Right ankle on Left knee Long Sitting Ankle Dorsiflexion with RTB Manual Therapy: STW R plantar fascia R heel pad taping  [facility] Adult PT Treatment:                                                DATE: 01-06-24 Therapeutic Exercise: Seated Ankle Plantar Flexion  with GTB Seated Plantar Fascia Stretch   Seated Arch Lifts  Seated Figure 4 Ankle Inversion with GTB   Clamshell with Resistance  GTB SLS  at counter Seated Heel Raise   Alternate Long Sitting Ankle Inversion withGTB   Pt sometimes with difficult with Right ankle on Left knee Long Sitting Ankle Dorsiflexion with RTB Updated HEP Manual Therapy: STW R plantar fascia R heel pad taping  Self Care: Timeline of healing.  Progressive overload and adaptability of tissues Shoe wear and heel cup silicone cups for comfort for prolonged standing    EVAL Therapeutic Exercise: Creating, reviewing, and completing below HEP   PATIENT EDUCATION ([facility]):  POC, diagnosis, prognosis, HEP, and outcome measures.  Pt educated via explanation, demonstration, and handout (HEP).  Pt confirms  understanding verbally.   HOME EXERCISE PROGRAM: Access Code: [email or web address] URL: [email or web address] Date: 01/06/2024 Prepared by: Graydon Dingwall  Program Notes toe Yoga  Exercises - Seated Ankle Plantar Flexion with Resistance Loop  - 1-2 x daily - 7 x weekly - 5 reps - 30-45 second hold - Seated Plantar Fascia Stretch  - 1 x daily - 7 x weekly - 1 sets - 3 reps - Seated Arch Lifts  - 1 x daily - 7 x weekly - 3 sets - 10 reps - 5 hold - Seated Figure  4 Ankle Inversion with Resistance  - 1 x daily - 7 x weekly - 3 sets - 10 reps - Clamshell with Resistance  - 1-15 x daily - 7 x weekly - 2 sets - 10 reps - Single Leg Stance  - 1 x daily - 7 x weekly - 1 sets - 4-5 reps - 30 to 60 seconds hold - Seated Heel Raise  - 1 x daily - 7 x weekly - 3 sets - 10 reps - Long Sitting Ankle Inversion with Resistance  - 1 x daily - 7 x weekly - 3 sets - 10 reps - 5 hold - Long Sitting Ankle Dorsiflexion with Anchored Resistance  - 1 x daily - 7 x weekly - 3 sets - 10 reps - 5 hold Treatment priorities   Eval        Progressive loading as tolerated                                          ASSESSMENT:  CLINICAL IMPRESSION: Pt reports that some days she notices improvement in heel pain. Although, some days pain can still be severe. Short session due to tardiness. Able to progress to standing heel raises without increased heel pain today. Will assess tolerance to today's session and plan to update HEP with closed chain strengthening if able.     Eval Patt is a 53 y.o. female who presents to clinic with signs and sxs consistent with R posterior heel pain.  Consistent with physician impression of insertional achilles tendinopathy.   She is using a heel lift and nitro patches.  Tried eccentrics but this was too much load and agging.  Started with band isometrics and will progress as able.   Charee will benefit from skilled PT to address relevant deficits and improve comfort in  walking and recreational activities.   OBJECTIVE IMPAIRMENTS: Pain, ankle ROM, gait, balance  ACTIVITY LIMITATIONS: walking, standing, housework, stairs  PERSONAL FACTORS: See medical history and pertinent history   REHAB POTENTIAL: Good  CLINICAL DECISION MAKING: Evolving/moderate complexity  EVALUATION COMPLEXITY: Moderate   GOALS:   SHORT TERM GOALS: Target date: 01/30/2024   Rasheka will be >75% HEP compliant to improve carryover between sessions and facilitate independent management of condition  Evaluation: ongoing Goal status: ONGOING   LONG TERM GOALS: Target date: 02/27/2024   Elizeth will self report >/= 50% decrease in pain from evaluation to improve function in daily tasks  Evaluation/Baseline: 10/10 max pain Goal status: INITIAL   2.  Marquis will show a >/= 20 pt improvement in LEFS score (MCID is ~11% or 9 pts) as a proxy for functional improvement   Evaluation/Baseline: 33/80 pts Goal status: INITIAL   3.  Priyah will be able to complete yard work, including mowing her grass, not limited by pain  Evaluation/Baseline: limited Goal status: INITIAL   4.  Zailyn will be able to walk for 30 min, not limited by pain   Evaluation/Baseline: 10 min Goal status: INITIAL    5.  Serine will be able to perform at least partial ROM SL heel raise on R, not limited by pain  Evaluation/Baseline: unable Goal status: INITIAL    PLAN: PT FREQUENCY: 1-2x/week  PT DURATION: 8 weeks  PLANNED INTERVENTIONS:  97164- PT Re-evaluation, 97110-Therapeutic exercises, 97530- Therapeutic activity, [id number]- Neuromuscular re-education, 97535- Self Care, [id number]- Manual therapy, [id number]- Gait training, [phone]- Aquatic  Therapy, [id number]- Electrical stimulation (manual), 02983- Vasopneumatic device, [id number]- Traction (mechanical), 02966- Ionotophoresis 4mg /ml Dexamethasone , Taping, Dry Needling, Joint manipulation, and Spinal manipulation.   Harlene Persons, PTA 01/25/24 9:44  AM Phone: [phone] Fax: [phone]

## 2024-01-28 ENCOUNTER — Ambulatory Visit: Admitting: Physical Therapy

## 2024-01-28 ENCOUNTER — Encounter: Payer: Self-pay | Admitting: Physical Therapy

## 2024-01-28 DIAGNOSIS — R600 Localized edema: Secondary | ICD-10-CM

## 2024-01-28 DIAGNOSIS — M25561 Pain in right knee: Secondary | ICD-10-CM | POA: Diagnosis not present

## 2024-01-28 DIAGNOSIS — R2689 Other abnormalities of gait and mobility: Secondary | ICD-10-CM | POA: Diagnosis not present

## 2024-01-28 DIAGNOSIS — M6281 Muscle weakness (generalized): Secondary | ICD-10-CM

## 2024-01-28 DIAGNOSIS — M25671 Stiffness of right ankle, not elsewhere classified: Secondary | ICD-10-CM | POA: Diagnosis not present

## 2024-01-28 DIAGNOSIS — Z483 Aftercare following surgery for neoplasm: Secondary | ICD-10-CM | POA: Diagnosis not present

## 2024-01-28 DIAGNOSIS — M25571 Pain in right ankle and joints of right foot: Secondary | ICD-10-CM

## 2024-01-28 DIAGNOSIS — G8929 Other chronic pain: Secondary | ICD-10-CM | POA: Diagnosis not present

## 2024-01-28 NOTE — Therapy (Signed)
 OUTPATIENT PHYSICAL THERAPY LOWER EXTREMITY TREATMENT   Patient Name: Mackenzie [name] MRN: [id number] DOB:[birth year], 53 y.o., female Today's Date: 01/28/2024   PT End of Session - 01/28/24 0851     Visit Number 7    Date for PT Re-Evaluation 02/27/24    Authorization Type [facility]    PT Start Time [phone]    PT Stop Time 0930    PT Time Calculation (min) 40 min            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, left (HCC) 01/23/2017   Cellulitis of right leg    Edema, lower extremity    GERD (gastroesophageal reflux disease)    Healthcare maintenance 03/25/2017   Herpesviral infection    Hot flashes 03/2017   Hyperlipidemia 03/20/2016   Impaired fasting glucose    Morbid obesity with body mass index (BMI) of 40.0 to 49.9 (HCC) 09/17/2017   Neoplasm of cervix    Osteoarthritis of knee    Other chest pain    Other specified disorders of veins    Personal history of chemotherapy    Personal history of radiation therapy    Polyneuropathy    Postmenopausal atrophic vaginitis    Pre-diabetes 07/26/2018   Hgb A1C elevated on 07/26/2018, Gestational Diabetes 2012   Pure hypercholesterolemia 03/30/2023   Seasonal allergies 2012   seasonal allergies causes allergic rhinitis and itchy, dry eyes per pt   Stress incontinence    Tachycardia    Thyrotoxicosis    Vitamin D  deficiency 05/2015   Past Surgical History:  Procedure Laterality Date   BREAST RECONSTRUCTION WITH PLACEMENT OF TISSUE EXPANDER AND FLEX HD (ACELLULAR HYDRATED DERMIS) Left 08/27/2017   Procedure: LEFT BREAST RECONSTRUCTION WITH PLACEMENT OF TISSUE EXPANDER AND FLEX HD;  Surgeon: Lowery Estefana RAMAN, DO;  Location: [facility];   Service: Plastics;  Laterality: Left;   CESAREAN SECTION     x2   KNEE ARTHROSCOPY WITH MEDIAL MENISECTOMY Right 11/12/2022   Procedure: RIGHT KNEE ARTHROSCOPY, PARTIAL MEDIAL MENISCECTOMY;  Surgeon: Jerri Kay HERO, MD;  Location: [facility];  Service: Orthopedics;  Laterality: Right;   LATISSIMUS FLAP TO BREAST Left 11/03/2018   LATISSIMUS FLAP TO BREAST Left 11/03/2018   Procedure: LATISSIMUS FLAP TO LEFT BREAST;  Surgeon: Lowery Estefana RAMAN, DO;  Location: [facility];  Service: Plastics;  Laterality: Left;   MASTECTOMY MODIFIED RADICAL Left 08/27/2017   MASTECTOMY MODIFIED RADICAL Left 08/27/2017   Procedure: LEFT MODIFIED RADICAL MASTECTOMY;  Surgeon: Vanderbilt Ned, MD;  Location: [facility];  Service: General;  Laterality: Left;   MASTOPEXY Right 03/03/2019   Procedure: RIGHT BREAST MASTOPEXY/REDUCTION;  Surgeon: Lowery Estefana RAMAN, DO;  Location: [facility];  Service: Plastics;  Laterality: Right;  3 hours, please   PORT-A-CATH REMOVAL Right 08/27/2017   Procedure: REMOVAL PORT-A-CATH RIGHT CHEST;  Surgeon: Vanderbilt Ned, MD;  Location: [facility];  Service: General;  Laterality: Right;   PORTACATH PLACEMENT Right 01/28/2017   Procedure: INSERTION PORT-A-CATH WITH ULTRASOUND;  Surgeon: Vanderbilt Ned, MD;  Location: [facility];  Service: General;  Laterality: Right;   REDUCTION MAMMAPLASTY Right    REMOVAL OF TISSUE EXPANDER  AND PLACEMENT OF IMPLANT Left 03/03/2019   Procedure: REMOVAL OF TISSUE EXPANDER AND PLACEMENT OF EXPANDER;  Surgeon: Lowery Estefana RAMAN, DO;  Location: [facility];  Service: Plastics;  Laterality: Left;   REMOVAL OF TISSUE EXPANDER AND PLACEMENT OF IMPLANT Left 05/25/2019   Procedure: LEFT BREAST REMOVAL OF TISSUE EXPANDER AND PLACEMENT OF IMPLANT;  Surgeon: Lowery Estefana RAMAN, DO;  Location: [facility];  Service: Plastics;  Laterality: Left;   TISSUE EXPANDER PLACEMENT Left 11/03/2018   Procedure:  PLACEMENT OF TISSUE EXPANDER LEFT BREAST;  Surgeon: Lowery Estefana RAMAN, DO;  Location: [facility];  Service: Plastics;  Laterality: Left;  Total case time is 3.5 hours   TUBAL LIGATION Bilateral 01/21/2011   Patient Active Problem List   Diagnosis Date Noted   SOBOE (shortness of breath on exertion) 11/11/2023   Primary osteoarthritis of left knee 10/09/2023   Achilles tendinitis, right leg 10/09/2023   Graves disease 07/16/2023   Multinodular goiter 07/16/2023   Right carpal tunnel syndrome 07/10/2023   Left carpal tunnel syndrome 07/10/2023   Pain of Right Knee 07/06/2023   History of breast reconstruction 05/15/2023   Pure hypercholesterolemia 03/30/2023   Acute meniscal tear, medial, right, initial encounter 11/12/2022   Plica syndrome of right knee 11/12/2022   Chondromalacia, right knee 11/12/2022   Depression 08/13/2022   BMI 40.0-44.9, adult (HCC) 08/13/2022   Obesity, Beginning BMI 08/13/2022   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   Boil of buttock, Left 12/24/2021   Hyperthyroidism 07/22/2021   Low serum thyroid  stimulating hormone (TSH) 06/12/2021   Other fatigue 05/23/2021   Hemoglobin low 05/23/2021   Polyphagia 05/16/2020   Vitamin D  deficiency 05/16/2020   At risk for osteoporosis 05/16/2020   Prediabetes 05/01/2020   Elevated blood pressure reading 05/01/2020   At risk for impaired metabolic function 05/01/2020   Breast asymmetry following reconstructive surgery 09/20/2019   Essential hypertension 08/17/2018   Class 3 severe obesity with serious comorbidity and body mass index (BMI) of 40.0 to 44.9 in adult 09/17/2017   Acquired absence of left breast 09/15/2017   Malignant neoplasm of upper-inner quadrant of left breast in female, 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   Healthcare maintenance 03/25/2017   Malignant 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, estrogen receptor positive (HCC) 01/23/2017   Carcinoma of breast metastatic  to axillary lymph node, left (HCC) 01/23/2017    PCP: [name], MD  REFERRING PROVIDER: [name], MD  THERAPY DIAG:  Pain in right ankle and joints of right foot  Other abnormalities of gait and mobility  Localized edema  Muscle weakness  REFERRING DIAG: Right Achilles tendinitis [M76.61]   Rationale for Evaluation and Treatment:  Rehabilitation  SUBJECTIVE:  PERTINENT PAST HISTORY:  Hx of breast cancer, R knee pain, L knee pain      PRECAUTIONS: None  WEIGHT BEARING RESTRICTIONS No  FALLS:  Has patient fallen in last 6 months? No, Number of falls: 0  MOI/History of condition:  Onset date: January  SUBJECTIVE STATEMENT  01/28/2024:  Pt reports that she is seeing some progress.  She is no longer using a cane to walk into work.  Her current pain is 6-7/10.   EVAL Eriel C Gutridge is a 53 y.o. female who presents to clinic with chief complaint of R posterior ankle pain.  Started in January with no specific inciting event.  Has slowly worsened since that time.  The has used a boot as needed,  she is using nitroglycerine patches, she ices.  She has some modest improvement but nothing significant.  Attempted eccentrics which were too painful.     Pain:  Are you having pain? 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  Education administrator: NA  Hand Dominance: NA  Patient Goals/Specific Activities: improved comfort with walking (10 min currently, was walking up to 3 miles/day), yard work   OBJECTIVE:   DIAGNOSTIC FINDINGS:  None relevant  GENERAL OBSERVATION/GAIT:  Antalgic gait with reduced time in stance on R  PALPATION: TTP insertion of R achilles tendon  LE MMT:  MMT Right (Eval) Left (Eval) Right 01/20/24  Hip flexion (L2, L3)     Knee extension (L3)  4 4   Knee flexion 4 4   Hip abduction     Hip extension     Hip external rotation     Hip internal rotation     Hip adduction     Ankle dorsiflexion (L4) 5 5   Ankle plantarflexion (S1) Partial ROM SL eel raise unable   Ankle inversion 4 5 5   Ankle eversion 4 5 5   Great Toe ext (L5)     Grossly      (Blank rows = not tested, score listed is out of 5 possible points OR may be listed in lbs of force.  N = WNL, D = diminished, C = clear for gross weakness with myotome testing, * = concordant pain with testing)  LE ROM:  ROM Right (Eval) Left (Eval)  Hip flexion    Hip extension    Hip abduction    Hip adduction    Hip internal rotation    Hip external rotation    Knee extension    Knee flexion    Ankle dorsiflexion 10 10  Ankle plantarflexion 50 50  Ankle inversion 35 35  Ankle eversion 20 20   (Blank rows = not tested, N = WNL, * = concordant pain with testing)  Functional Tests  Eval 01/22/24   Progressive balance screen (highest level completed for >/= 10''):  Feet together: 10'' Semi Tandem: R in rear 10'', L in rear 10'' Tandem: R in rear 4'', L in rear 8'' SLS: R unable, L unable  SLS 28 sec right                                                         PATIENT SURVEYS:  LEFS: 33/80  TODAY'S TREATMENT:  [facility] Adult PT Treatment:                                                DATE: 01/28/24 Therapeutic Exercise: Towel scrunches - 1' x2 30'' heel raise isometrics - x4 KT tape for achilles  Therapeutic Activity  Cybex leg press - 40# bil x10, SL - 3x10 Standing hip abd machine - 25# - 15x ea Pt education regarding tendon response to exercise and goals of isometrics/tendon loading  Neuromuscular re-ed: Tandem on foam    [facility] Adult PT Treatment:  DATE: 01/22/24 Therapeutic Exercise: Seated toe scrunches and toe yoga Seated BTB PF x 20 Seated Inv GTB x 20 Seated gastroc and soleus stretch with  towel  Seated heel raise on 2 inch step, 15# KB ,  5 sec x 10, 10 sec x 10  SLS 28 sec , min increased pain.  Right heel pad taping.       [facility] Adult PT Treatment:                                                DATE: 01/20/24 Therapeutic Exercise: Seated BTB PF Seated heel raise , 10 sec holds Seated heel raise on 2 inch step , 5 sec x 10 , added 10# to knee x 40  Supine DF GTB x 10 Right heel pad taping.      [facility] Adult PT Treatment:                                                DATE: 01-14-24 Therapeutic Exercise:  Reviewed HEP in detail with PT/pt demo Toe yoga Right Seated Ankle Plantar Flexion  with GTB Seated Plantar Fascia Stretch   Seated Arch Lifts and standing Seated Figure 4 Ankle Inversion with GTB   Clamshell with Resistance  GTB SLS  with UE on back of chair Seated Heel Raise   Alternate Long Sitting Ankle Inversion withGTB   Pt sometimes with difficult with Right ankle on Left knee Long Sitting Ankle Dorsiflexion with RTB Manual Therapy: STW R plantar fascia R heel pad taping  [facility] Adult PT Treatment:                                                DATE: 01-06-24 Therapeutic Exercise: Seated Ankle Plantar Flexion  with GTB Seated Plantar Fascia Stretch   Seated Arch Lifts  Seated Figure 4 Ankle Inversion with GTB   Clamshell with Resistance  GTB SLS  at counter Seated Heel Raise   Alternate Long Sitting Ankle Inversion withGTB   Pt sometimes with difficult with Right ankle on Left knee Long Sitting Ankle Dorsiflexion with RTB Updated HEP Manual Therapy: STW R plantar fascia R heel pad taping  Self Care: Timeline of healing.  Progressive overload and adaptability of tissues Shoe wear and heel cup silicone cups for comfort for prolonged standing    EVAL Therapeutic Exercise: Creating, reviewing, and completing below HEP   PATIENT EDUCATION ([facility]):  POC, diagnosis, prognosis, HEP, and outcome measures.  Pt educated via explanation,  demonstration, and handout (HEP).  Pt confirms understanding verbally.   HOME EXERCISE PROGRAM: Access Code: [email or web address] URL: [email or web address] Date: 01/06/2024 Prepared by: Graydon Dingwall  Program Notes toe Yoga  Exercises - Seated Ankle Plantar Flexion with Resistance Loop  - 1-2 x daily - 7 x weekly - 5 reps - 30-45 second hold - Seated Plantar Fascia Stretch  - 1 x daily - 7 x weekly - 1 sets - 3 reps - Seated Arch Lifts  - 1 x daily - 7 x weekly - 3 sets - 10 reps - 5 hold - Seated Figure  4 Ankle Inversion with Resistance  - 1 x daily - 7 x weekly - 3 sets - 10 reps - Clamshell with Resistance  - 1-15 x daily - 7 x weekly - 2 sets - 10 reps - Single Leg Stance  - 1 x daily - 7 x weekly - 1 sets - 4-5 reps - 30 to 60 seconds hold - Seated Heel Raise  - 1 x daily - 7 x weekly - 3 sets - 10 reps - Long Sitting Ankle Inversion with Resistance  - 1 x daily - 7 x weekly - 3 sets - 10 reps - 5 hold - Long Sitting Ankle Dorsiflexion with Anchored Resistance  - 1 x daily - 7 x weekly - 3 sets - 10 reps - 5 hold Treatment priorities   Eval 8/21       Progressive loading as tolerated Isometrics 30-45'' x4-5        R LE/hip strength                                 ASSESSMENT:  CLINICAL IMPRESSION: Zari tolerated session well with no adverse reaction.  Concentrated on isolated achilles loading combined with general R LE strengthening.  Pt with significant fatigue with leg press@ 40#.  Trialed KT tape.  Eval Kaydence is a 53 y.o. female who presents to clinic with signs and sxs consistent with R posterior heel pain.  Consistent with physician impression of insertional achilles tendinopathy.   She is using a heel lift and nitro patches.  Tried eccentrics but this was too much load and agging.  Started with band isometrics and will progress as able.   Kenedi will benefit from skilled PT to address relevant deficits and improve comfort in walking and recreational  activities.   OBJECTIVE IMPAIRMENTS: Pain, ankle ROM, gait, balance  ACTIVITY LIMITATIONS: walking, standing, housework, stairs  PERSONAL FACTORS: See medical history and pertinent history   REHAB POTENTIAL: Good  CLINICAL DECISION MAKING: Evolving/moderate complexity  EVALUATION COMPLEXITY: Moderate   GOALS:   SHORT TERM GOALS: Target date: 01/30/2024   Alisabeth will be >75% HEP compliant to improve carryover between sessions and facilitate independent management of condition  Evaluation: ongoing Goal status: ONGOING   LONG TERM GOALS: Target date: 02/27/2024   Moneka will self report >/= 50% decrease in pain from evaluation to improve function in daily tasks  Evaluation/Baseline: 10/10 max pain Goal status: INITIAL   2.  Tameaka will show a >/= 20 pt improvement in LEFS score (MCID is ~11% or 9 pts) as a proxy for functional improvement   Evaluation/Baseline: 33/80 pts Goal status: INITIAL   3.  Menucha will be able to complete yard work, including mowing her grass, not limited by pain  Evaluation/Baseline: limited Goal status: INITIAL   4.  Dylynn will be able to walk for 30 min, not limited by pain   Evaluation/Baseline: 10 min Goal status: INITIAL    5.  Launi will be able to perform at least partial ROM SL heel raise on R, not limited by pain  Evaluation/Baseline: unable Goal status: INITIAL    PLAN: PT FREQUENCY: 1-2x/week  PT DURATION: 8 weeks  PLANNED INTERVENTIONS:  [id number]- PT Re-evaluation, 97110-Therapeutic exercises, 97530- Therapeutic activity, [id number]- Neuromuscular re-education, 97535- Self Care, [id number]- Manual therapy, [id number]- Gait training, [id number]- Aquatic Therapy, [id number]- Electrical stimulation (manual), [id number]- Vasopneumatic device, [id number]- Traction (mechanical), [id number]- Ionotophoresis 4mg /ml Dexamethasone , Taping, Dry Needling, Joint  manipulation, and Spinal manipulation.   Helene BRAVO Conrad Paone PT 01/28/24 9:30 AM Phone:  [phone] Fax: [phone]

## 2024-02-01 ENCOUNTER — Ambulatory Visit: Admitting: Physical Therapy

## 2024-02-01 ENCOUNTER — Encounter: Payer: Self-pay | Admitting: Physical Therapy

## 2024-02-01 DIAGNOSIS — R600 Localized edema: Secondary | ICD-10-CM | POA: Diagnosis not present

## 2024-02-01 DIAGNOSIS — M25671 Stiffness of right ankle, not elsewhere classified: Secondary | ICD-10-CM | POA: Diagnosis not present

## 2024-02-01 DIAGNOSIS — G8929 Other chronic pain: Secondary | ICD-10-CM | POA: Diagnosis not present

## 2024-02-01 DIAGNOSIS — M25571 Pain in right ankle and joints of right foot: Secondary | ICD-10-CM

## 2024-02-01 DIAGNOSIS — Z483 Aftercare following surgery for neoplasm: Secondary | ICD-10-CM | POA: Diagnosis not present

## 2024-02-01 DIAGNOSIS — R2689 Other abnormalities of gait and mobility: Secondary | ICD-10-CM

## 2024-02-01 DIAGNOSIS — M25561 Pain in right knee: Secondary | ICD-10-CM | POA: Diagnosis not present

## 2024-02-01 DIAGNOSIS — M6281 Muscle weakness (generalized): Secondary | ICD-10-CM | POA: Diagnosis not present

## 2024-02-01 NOTE — Therapy (Signed)
 OUTPATIENT PHYSICAL THERAPY LOWER EXTREMITY TREATMENT   Patient Name: Mackenzie [name] MRN: [id number] DOB:1971/01/17, 53 y.o., female Today's Date: 02/01/2024   PT End of Session - 02/01/24 0936     Visit Number 8    Date for PT Re-Evaluation 02/27/24    Authorization Type [employer]    PT Start Time [phone]    PT Stop Time 1015    PT Time Calculation (min) 41 min            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, left (HCC) 01/23/2017   Cellulitis of right leg    Edema, lower extremity    GERD (gastroesophageal reflux disease)    Healthcare maintenance 03/25/2017   Herpesviral infection    Hot flashes 03/2017   Hyperlipidemia 03/20/2016   Impaired fasting glucose    Morbid obesity with body mass index (BMI) of 40.0 to 49.9 (HCC) 09/17/2017   Neoplasm of cervix    Osteoarthritis of knee    Other chest pain    Other specified disorders of veins    Personal history of chemotherapy    Personal history of radiation therapy    Polyneuropathy    Postmenopausal atrophic vaginitis    Pre-diabetes 07/26/2018   Hgb A1C elevated on 07/26/2018, Gestational Diabetes 2012   Pure hypercholesterolemia 03/30/2023   Seasonal allergies 2012   seasonal allergies causes allergic rhinitis and itchy, dry eyes per pt   Stress incontinence    Tachycardia    Thyrotoxicosis    Vitamin D  deficiency 05/2015   Past Surgical History:  Procedure Laterality Date   BREAST RECONSTRUCTION WITH PLACEMENT OF TISSUE EXPANDER AND FLEX HD (ACELLULAR HYDRATED DERMIS) Left 08/27/2017   Procedure: LEFT BREAST RECONSTRUCTION WITH PLACEMENT OF TISSUE EXPANDER AND FLEX HD;  Surgeon: Lowery Estefana RAMAN, DO;  Location: [facility];   Service: Plastics;  Laterality: Left;   CESAREAN SECTION     x2   KNEE ARTHROSCOPY WITH MEDIAL MENISECTOMY Right 11/12/2022   Procedure: RIGHT KNEE ARTHROSCOPY, PARTIAL MEDIAL MENISCECTOMY;  Surgeon: Jerri Kay HERO, MD;  Location: [facility];  Service: Orthopedics;  Laterality: Right;   LATISSIMUS FLAP TO BREAST Left 11/03/2018   LATISSIMUS FLAP TO BREAST Left 11/03/2018   Procedure: LATISSIMUS FLAP TO LEFT BREAST;  Surgeon: Lowery Estefana RAMAN, DO;  Location: [facility];  Service: Plastics;  Laterality: Left;   MASTECTOMY MODIFIED RADICAL Left 08/27/2017   MASTECTOMY MODIFIED RADICAL Left 08/27/2017   Procedure: LEFT MODIFIED RADICAL MASTECTOMY;  Surgeon: Vanderbilt Ned, MD;  Location: [facility];  Service: General;  Laterality: Left;   MASTOPEXY Right 03/03/2019   Procedure: RIGHT BREAST MASTOPEXY/REDUCTION;  Surgeon: Lowery Estefana RAMAN, DO;  Location: [facility];  Service: Plastics;  Laterality: Right;  3 hours, please   PORT-A-CATH REMOVAL Right 08/27/2017   Procedure: REMOVAL PORT-A-CATH RIGHT CHEST;  Surgeon: Vanderbilt Ned, MD;  Location: [facility];  Service: General;  Laterality: Right;   PORTACATH PLACEMENT Right 01/28/2017   Procedure: INSERTION PORT-A-CATH WITH ULTRASOUND;  Surgeon: Vanderbilt Ned, MD;  Location: [facility];  Service: General;  Laterality: Right;   REDUCTION MAMMAPLASTY Right    REMOVAL OF TISSUE EXPANDER  AND PLACEMENT OF IMPLANT Left 03/03/2019   Procedure: REMOVAL OF TISSUE EXPANDER AND PLACEMENT OF EXPANDER;  Surgeon: Lowery Estefana RAMAN, DO;  Location: [facility];  Service: Plastics;  Laterality: Left;   REMOVAL OF TISSUE EXPANDER AND PLACEMENT OF IMPLANT Left 05/25/2019   Procedure: LEFT BREAST REMOVAL OF TISSUE EXPANDER AND PLACEMENT OF IMPLANT;  Surgeon: Lowery Estefana RAMAN, DO;  Location:  SURGERY CENTER;  Service: Plastics;  Laterality: Left;   TISSUE EXPANDER PLACEMENT Left 11/03/2018   Procedure:  PLACEMENT OF TISSUE EXPANDER LEFT BREAST;  Surgeon: Lowery Estefana RAMAN, DO;  Location: [facility];  Service: Plastics;  Laterality: Left;  Total case time is 3.5 hours   TUBAL LIGATION Bilateral 01/21/2011   Patient Active Problem List   Diagnosis Date Noted   SOBOE (shortness of breath on exertion) 11/11/2023   Primary osteoarthritis of left knee 10/09/2023   Achilles tendinitis, right leg 10/09/2023   Graves disease 07/16/2023   Multinodular goiter 07/16/2023   Right carpal tunnel syndrome 07/10/2023   Left carpal tunnel syndrome 07/10/2023   Pain of Right Knee 07/06/2023   History of breast reconstruction 05/15/2023   Pure hypercholesterolemia 03/30/2023   Acute meniscal tear, medial, right, initial encounter 11/12/2022   Plica syndrome of right knee 11/12/2022   Chondromalacia, right knee 11/12/2022   Depression 08/13/2022   BMI 40.0-44.9, adult (HCC) 08/13/2022   Obesity, Beginning BMI 08/13/2022   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   Boil of buttock, Left 12/24/2021   Hyperthyroidism 07/22/2021   Low serum thyroid  stimulating hormone (TSH) 06/12/2021   Other fatigue 05/23/2021   Hemoglobin low 05/23/2021   Polyphagia 05/16/2020   Vitamin D  deficiency 05/16/2020   At risk for osteoporosis 05/16/2020   Prediabetes 05/01/2020   Elevated blood pressure reading 05/01/2020   At risk for impaired metabolic function 05/01/2020   Breast asymmetry following reconstructive surgery 09/20/2019   Essential hypertension 08/17/2018   Class 3 severe obesity with serious comorbidity and body mass index (BMI) of 40.0 to 44.9 in adult 09/17/2017   Acquired absence of left breast 09/15/2017   Malignant neoplasm of upper-inner quadrant of left breast in female, 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   Healthcare maintenance 03/25/2017   Malignant 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, estrogen receptor positive (HCC) 01/23/2017   Carcinoma of breast metastatic  to axillary lymph node, left (HCC) 01/23/2017    PCP: [name], MD  REFERRING PROVIDER: Cleatrice Ludie SAUNDERS, MD  THERAPY DIAG:  Pain in right ankle and joints of right foot  Other abnormalities of gait and mobility  Localized edema  REFERRING DIAG: Right Achilles tendinitis [M76.61]   Rationale for Evaluation and Treatment:  Rehabilitation  SUBJECTIVE:  PERTINENT PAST HISTORY:  Hx of breast cancer, R knee pain, L knee pain      PRECAUTIONS: None  WEIGHT BEARING RESTRICTIONS No  FALLS:  Has patient fallen in last 6 months? No, Number of falls: 0  MOI/History of condition:  Onset date: January  SUBJECTIVE STATEMENT  02/01/2024:  Pt reports that she is seeing some progress, maybe 30% improvement. Has some more good days but can still have pretty painful days.   She arrives with 8/10 pain although pain was only 2-3/10 over the weekend.   EVAL Sotiria C Lindor is a 53 y.o. female who presents to clinic with chief complaint of R posterior ankle pain.  Started in January with no specific inciting event.  Has slowly worsened since that  time.  The has used a boot as needed, she is using nitroglycerine patches, she ices.  She has some modest improvement but nothing significant.  Attempted eccentrics which were too painful.     Pain:  Are you having pain? 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  Education administrator: NA  Hand Dominance: NA  Patient Goals/Specific Activities: improved comfort with walking (10 min currently, was walking up to 3 miles/day), yard work   OBJECTIVE:   DIAGNOSTIC FINDINGS:  None relevant  GENERAL OBSERVATION/GAIT:  Antalgic gait with reduced time in stance on R  PALPATION: TTP insertion of R achilles tendon  LE MMT:  MMT Right (Eval)  Left (Eval) Right 01/20/24  Hip flexion (L2, L3)     Knee extension (L3) 4 4   Knee flexion 4 4   Hip abduction     Hip extension     Hip external rotation     Hip internal rotation     Hip adduction     Ankle dorsiflexion (L4) 5 5   Ankle plantarflexion (S1) Partial ROM SL eel raise unable   Ankle inversion 4 5 5   Ankle eversion 4 5 5   Great Toe ext (L5)     Grossly      (Blank rows = not tested, score listed is out of 5 possible points OR may be listed in lbs of force.  N = WNL, D = diminished, C = clear for gross weakness with myotome testing, * = concordant pain with testing)  LE ROM:  ROM Right (Eval) Left (Eval)  Hip flexion    Hip extension    Hip abduction    Hip adduction    Hip internal rotation    Hip external rotation    Knee extension    Knee flexion    Ankle dorsiflexion 10 10  Ankle plantarflexion 50 50  Ankle inversion 35 35  Ankle eversion 20 20   (Blank rows = not tested, N = WNL, * = concordant pain with testing)  Functional Tests  Eval 01/22/24   Progressive balance screen (highest level completed for >/= 10''):  Feet together: 10'' Semi Tandem: R in rear 10'', L in rear 10'' Tandem: R in rear 4'', L in rear 8'' SLS: R unable, L unable  SLS 28 sec right                                                         PATIENT SURVEYS:  LEFS: 33/80  TODAY'S TREATMENT: [facility] Adult PT Treatment:                                                DATE: 02/01/24 Therapeutic Exercise: Seated heel raises Step stretch for gastroc 10 sec x 3  Bilat heel raise 30 sec x 3  Soleus heel raise 22 sec , 25 sec  Leg press 40# SL 10 x 3  Seated heel raise from 2 inch step 15# 15 sec x 3 R Wooden rocker A/P rocking [employer]  stretch Self Care- concurrent with modalities Consider ionto  Resume more frequent ice applications   Modalities: Ice pack right heel x 8 minutes      [facility] Adult PT Treatment:                                                 DATE: 01/28/24 Therapeutic Exercise: Towel scrunches - 1' x2 30'' heel raise isometrics - x4 KT tape for achilles  Therapeutic Activity  Cybex leg press - 40# bil x10, SL - 3x10 Standing hip abd machine - 25# - 15x ea Pt education regarding tendon response to exercise and goals of isometrics/tendon loading  Neuromuscular re-ed: Tandem on foam    [facility] Adult PT Treatment:                                                DATE: 01/22/24 Therapeutic Exercise: Seated toe scrunches and toe yoga Seated BTB PF x 20 Seated Inv GTB x 20 Seated gastroc and soleus stretch with towel  Seated heel raise on 2 inch step, 15# KB ,  5 sec x 10, 10 sec x 10  SLS 28 sec , min increased pain Right heel pad taping.       [facility] Adult PT Treatment:                                                DATE: 01/20/24 Therapeutic Exercise: Seated BTB PF Seated heel raise , 10 sec holds Seated heel raise on 2 inch step , 5 sec x 10 , added 10# to knee x 40  Supine DF GTB x 10 Right heel pad taping.      [facility] Adult PT Treatment:                                                DATE: 01-14-24 Therapeutic Exercise:  Reviewed HEP in detail with PT/pt demo Toe yoga Right Seated Ankle Plantar Flexion  with GTB Seated Plantar Fascia Stretch   Seated Arch Lifts and standing Seated Figure 4 Ankle Inversion with GTB   Clamshell with Resistance  GTB SLS  with UE on back of chair Seated Heel Raise   Alternate Long Sitting Ankle Inversion withGTB   Pt sometimes with difficult with Right ankle on Left knee Long Sitting Ankle Dorsiflexion with RTB Manual Therapy: STW R plantar fascia R heel pad taping  [facility] Adult PT Treatment:                                                DATE: 01-06-24 Therapeutic Exercise: Seated Ankle Plantar Flexion  with GTB Seated Plantar Fascia Stretch   Seated Arch Lifts  Seated Figure 4 Ankle Inversion with GTB   Clamshell with Resistance  GTB SLS  at counter Seated Heel Raise  Alternate Long Sitting Ankle Inversion withGTB   Pt sometimes with difficult with Right ankle on Left knee Long Sitting Ankle Dorsiflexion with RTB Updated HEP Manual Therapy: STW R plantar fascia R heel pad taping  Self Care: Timeline of healing.  Progressive overload and adaptability of tissues Shoe wear and heel cup silicone cups for comfort for prolonged standing    EVAL Therapeutic Exercise: Creating, reviewing, and completing below HEP   PATIENT EDUCATION ([facility]):  POC, diagnosis, prognosis, HEP, and outcome measures.  Pt educated via explanation, demonstration, and handout (HEP).  Pt confirms understanding verbally.   HOME EXERCISE PROGRAM: Access Code: [email or web address] URL: [email or web address] Date: 01/06/2024 Prepared by: Graydon Dingwall  Program Notes toe Yoga  Exercises - Seated Ankle Plantar Flexion with Resistance Loop  - 1-2 x daily - 7 x weekly - 5 reps - 30-45 second hold - Seated Plantar Fascia Stretch  - 1 x daily - 7 x weekly - 1 sets - 3 reps - Seated Arch Lifts  - 1 x daily - 7 x weekly - 3 sets - 10 reps - 5 hold - Seated Figure 4 Ankle Inversion with Resistance  - 1 x daily - 7 x weekly - 3 sets - 10 reps - Clamshell with Resistance  - 1-15 x daily - 7 x weekly - 2 sets - 10 reps - Single Leg Stance  - 1 x daily - 7 x weekly - 1 sets - 4-5 reps - 30 to 60 seconds hold - Seated Heel Raise  - 1 x daily - 7 x weekly - 3 sets - 10 reps - Long Sitting Ankle Inversion with Resistance  - 1 x daily - 7 x weekly - 3 sets - 10 reps - 5 hold - Long Sitting Ankle Dorsiflexion with Anchored Resistance  - 1 x daily - 7 x weekly - 3 sets - 10 reps - 5 hold Treatment priorities   Eval 8/21       Progressive loading as tolerated Isometrics 30-45'' x4-5        R LE/hip strength                                 ASSESSMENT:  CLINICAL IMPRESSION: Makeshia tolerated session well with no adverse reaction. No improvement reported with KT tape.   Concentrated on isolated achilles loading combined with general R LE strengthening. She arrived with 8/10 pain today although pain was only 2-3/10 over the weekend. She is able to tolerate increased closed chain exercises although demonstrates minimal range in bilateral heel lift.  Pain reduced to 5/10 after strengthening and stretching today. Overall she reports 20-30% improvement since starting PT. She will see MD prior to next appt. She currently has been wearing nitro patches for 3-4 weeks. Will have her ask MD if she might benefit from a trial of iontophoresis with dexamethasone . . Applied ice at end of session as a reminder to use more frequently with pain is elevated.   Eval Kahlia is a 53 y.o. female who presents to clinic with signs and sxs consistent with R posterior heel pain.  Consistent with physician impression of insertional achilles tendinopathy.   She is using a heel lift and nitro patches.  Tried eccentrics but this was too much load and agging.  Started with band isometrics and will progress as able.   Rhya will benefit from skilled PT to address relevant deficits and improve comfort in walking  and recreational activities.   OBJECTIVE IMPAIRMENTS: Pain, ankle ROM, gait, balance  ACTIVITY LIMITATIONS: walking, standing, housework, stairs  PERSONAL FACTORS: See medical history and pertinent history   REHAB POTENTIAL: Good  CLINICAL DECISION MAKING: Evolving/moderate complexity  EVALUATION COMPLEXITY: Moderate   GOALS:   SHORT TERM GOALS: Target date: 01/30/2024   Mackensey will be >75% HEP compliant to improve carryover between sessions and facilitate independent management of condition  Evaluation: ongoing Goal status: ONGOING   LONG TERM GOALS: Target date: 02/27/2024   Storm will self report >/= 50% decrease in pain from evaluation to improve function in daily tasks  Evaluation/Baseline: 10/10 max pain Goal status: INITIAL   2.  Chanah will show a >/= 20  pt improvement in LEFS score (MCID is ~11% or 9 pts) as a proxy for functional improvement   Evaluation/Baseline: 33/80 pts Goal status: INITIAL   3.  Josi will be able to complete yard work, including mowing her grass, not limited by pain  Evaluation/Baseline: limited Goal status: INITIAL   4.  Gwendoline will be able to walk for 30 min, not limited by pain   Evaluation/Baseline: 10 min Goal status: INITIAL    5.  Michelle will be able to perform at least partial ROM SL heel raise on R, not limited by pain  Evaluation/Baseline: unable Goal status: INITIAL    PLAN: PT FREQUENCY: 1-2x/week  PT DURATION: 8 weeks  PLANNED INTERVENTIONS:  [id number]- PT Re-evaluation, 97110-Therapeutic exercises, 97530- Therapeutic activity, [id number]- Neuromuscular re-education, 97535- Self Care, [id number]- Manual therapy, [id number]- Gait training, [id number]- Aquatic Therapy, [id number]- Electrical stimulation (manual), [id number]- Vasopneumatic device, [id number]- Traction (mechanical), [id number]- Ionotophoresis 4mg /ml Dexamethasone , Taping, Dry Needling, Joint manipulation, and Spinal manipulation.   Harlene CHRISTELLA Persons PTA 02/01/24 1:19 PM Phone: [phone] Fax: [phone]

## 2024-02-03 ENCOUNTER — Ambulatory Visit (INDEPENDENT_AMBULATORY_CARE_PROVIDER_SITE_OTHER): Admitting: Family Medicine

## 2024-02-03 ENCOUNTER — Encounter: Payer: Self-pay | Admitting: Family Medicine

## 2024-02-03 VITALS — BP 137/63 | Ht 65.5 in | Wt 260.0 lb

## 2024-02-03 DIAGNOSIS — M7661 Achilles tendinitis, right leg: Secondary | ICD-10-CM

## 2024-02-03 NOTE — Patient Instructions (Addendum)
 Continue the nitroglycerin  patches. Take this off when they do iontophoresis in physical therapy. Continue home exercises as well. Follow up in 6 weeks but if things are slowly trending the right way we can see you back in 3 months instead.

## 2024-02-03 NOTE — Progress Notes (Signed)
 PCP: [name], MD  Subjective:   HPI: Patient is a 53 y.o. female here for follow-up evaluation of right-sided Achilles tendonitis, she was last seen in clinic on 12/30/2023.  Patient reports that her right-sided achilles tendonitis is 20-30% improved from prior. She has been dealing with the achilles pain since 05/2023. She has been attending formal physical therapy, applying 1/2 nitroglycerin  patches, and wearing a heel lift in her shoe. She says that she rarely has to rely on the walking boot. She has been taking Tramadol  and Tylenol  as needed for pain. Activities that make her pain worse include driving, prolonged periods of sitting, and walking. Of note, physical therapy would like to start Iontophoresis with Dexamethasone  and is looking for guidance regarding how to incorporate this around the nitroglycerin  patches.   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, left (HCC) 01/23/2017   Cellulitis of right leg    Edema, lower extremity    GERD (gastroesophageal reflux disease)    Healthcare maintenance 03/25/2017   Herpesviral infection    Hot flashes 03/2017   Hyperlipidemia 03/20/2016   Impaired fasting glucose    Morbid obesity with body mass index (BMI) of 40.0 to 49.9 (HCC) 09/17/2017   Neoplasm of cervix    Osteoarthritis of knee    Other chest pain    Other specified disorders of veins    Personal history of chemotherapy    Personal history of radiation therapy    Polyneuropathy    Postmenopausal atrophic vaginitis    Pre-diabetes 07/26/2018   Hgb A1C elevated on 07/26/2018, Gestational Diabetes 2012   Pure hypercholesterolemia 03/30/2023   Seasonal allergies 2012   seasonal allergies  causes allergic rhinitis and itchy, dry eyes per pt   Stress incontinence    Tachycardia    Thyrotoxicosis    Vitamin D  deficiency 05/2015    Current Outpatient Medications on File Prior to Visit  Medication Sig Dispense Refill   acetaminophen  (TYLENOL ) 500 MG tablet Take 1,000 mg by mouth every 6 (six) hours as needed (for pain/headaches.).      Ascorbic Acid (VITAMIN C PO) Take by mouth daily.     buPROPion  (WELLBUTRIN  SR) 200 MG 12 hr tablet Take 1 tablet (200 mg total) by mouth daily. 90 tablet 0   Calcium -Phosphorus-Vitamin D  (VITAMIN D3/CALCIUM /PHOSPHORUS PO) Take 600 mg by mouth daily.     cetirizine (ZYRTEC) 10 MG tablet Take 10 mg by mouth daily.     cyclobenzaprine  (FLEXERIL ) 10 MG tablet Take 1/2 tablet (5 mg total) by mouth 2 (two) times daily as needed. 30 tablet 0   cyclobenzaprine  (FLEXERIL ) 10 MG tablet Take 1/2 tablet (5 mg total) by mouth 2 (two) times daily as needed. 90 tablet 0   diclofenac  (VOLTAREN ) 75 MG EC tablet Take 1 tablet (75 mg total) by mouth 2 (two) times daily as needed. 60 tablet 0   gabapentin  (NEURONTIN ) 300 MG capsule Take 1 capsule (300 mg total) by mouth 3 (three) times daily. 270 capsule 0   letrozole  (FEMARA ) 2.5 MG tablet Take 1 tablet (2.5 mg total) by mouth daily. 90 tablet 4   loratadine  (CLARITIN ) 10 MG tablet Take 1 tablet (10 mg  total) by mouth daily. (Patient taking differently: Take 10 mg by mouth daily as needed for allergies.) 90 tablet 2   losartan  (COZAAR ) 50 MG tablet Take 1 tablet (50 mg total) by mouth daily. 90 tablet 0   metFORMIN  (GLUCOPHAGE -XR) 500 MG 24 hr tablet Take 1 tablet (500 mg total) by mouth 2 (two) times daily with a meal. 180 tablet 0   methimazole  (TAPAZOLE ) 5 MG tablet Take 1 tablet (5 mg total) by mouth daily in the afternoon. 90 tablet 3   metoprolol  succinate (TOPROL -XL) 50 MG 24 hr tablet Take 1/2 tablet (25 mg total) by mouth daily. 90 tablet 0   montelukast  (SINGULAIR ) 10 MG tablet Take 1 tablet (10 mg total) by  mouth at bedtime. 90 tablet 0   Multiple Vitamin (MULTIVITAMIN WITH MINERALS) TABS tablet Take 1 tablet by mouth daily.      nitroGLYCERIN  (NITRODUR - DOSED IN MG/24 HR) 0.2 mg/hr patch Apply 1/4th patch to affected achilles, change daily 30 patch 1   Omega-3 Fatty Acids (FISH OIL) 1000 MG CAPS Take 1,000 mg by mouth 2 (two) times daily.      omeprazole  (PRILOSEC) 40 MG capsule Take 1 capsule (40 mg total) by mouth daily 1/2 to 1 hour before morning meal. 90 capsule 0   ondansetron  (ZOFRAN ) 4 MG tablet Take 1 tablet (4 mg total) by mouth every 8 (eight) hours as needed for nausea or vomiting. 20 tablet 0   Probiotic Product (PROBIOTIC DAILY PO) Take 1 capsule by mouth 2 (two) times daily.      rosuvastatin  (CRESTOR ) 20 MG tablet Take 1 tablet by mouth daily 90 tablet 0   spironolactone  (ALDACTONE ) 25 MG tablet Take 1 tablet (25 mg total) by mouth daily. 90 tablet 0   traMADol  (ULTRAM ) 50 MG tablet Take 1-2 tablets (50-100 mg total) by mouth daily as needed. 30 tablet 1   ZINC OXIDE PO Take 1 tablet by mouth 2 (two) times a day.     Current Facility-Administered Medications on File Prior to Visit  Medication Dose Route Frequency Provider Last Rate Last Admin   sodium chloride  flush (NS) 0.9 % injection 10 mL  10 mL Intravenous PRN Magrinat, Gustav C, MD   10 mL at 03/10/17 1239    Past Surgical History:  Procedure Laterality Date   BREAST RECONSTRUCTION WITH PLACEMENT OF TISSUE EXPANDER AND FLEX HD (ACELLULAR HYDRATED DERMIS) Left 08/27/2017   Procedure: LEFT BREAST RECONSTRUCTION WITH PLACEMENT OF TISSUE EXPANDER AND FLEX HD;  Surgeon: Lowery Estefana RAMAN, DO;  Location: [facility];  Service: Plastics;  Laterality: Left;   CESAREAN SECTION     x2   KNEE ARTHROSCOPY WITH MEDIAL MENISECTOMY Right 11/12/2022   Procedure: RIGHT KNEE ARTHROSCOPY, PARTIAL MEDIAL MENISCECTOMY;  Surgeon: Jerri Kay HERO, MD;  Location: [facility];  Service: Orthopedics;  Laterality: Right;   LATISSIMUS FLAP TO  BREAST Left 11/03/2018   LATISSIMUS FLAP TO BREAST Left 11/03/2018   Procedure: LATISSIMUS FLAP TO LEFT BREAST;  Surgeon: Lowery Estefana RAMAN, DO;  Location: [facility];  Service: Plastics;  Laterality: Left;   MASTECTOMY MODIFIED RADICAL Left 08/27/2017   MASTECTOMY MODIFIED RADICAL Left 08/27/2017   Procedure: LEFT MODIFIED RADICAL MASTECTOMY;  Surgeon: Vanderbilt Ned, MD;  Location: [facility];  Service: General;  Laterality: Left;   MASTOPEXY Right 03/03/2019   Procedure: RIGHT BREAST MASTOPEXY/REDUCTION;  Surgeon: Lowery Estefana RAMAN, DO;  Location: [facility];  Service: Plastics;  Laterality: Right;  3 hours, please  PORT-A-CATH REMOVAL Right 08/27/2017   Procedure: REMOVAL PORT-A-CATH RIGHT CHEST;  Surgeon: Vanderbilt Ned, MD;  Location: [facility];  Service: General;  Laterality: Right;   PORTACATH PLACEMENT Right 01/28/2017   Procedure: INSERTION PORT-A-CATH WITH ULTRASOUND;  Surgeon: Vanderbilt Ned, MD;  Location: [facility];  Service: General;  Laterality: Right;   REDUCTION MAMMAPLASTY Right    REMOVAL OF TISSUE EXPANDER AND PLACEMENT OF IMPLANT Left 03/03/2019   Procedure: REMOVAL OF TISSUE EXPANDER AND PLACEMENT OF EXPANDER;  Surgeon: Lowery Estefana RAMAN, DO;  Location: [facility];  Service: Plastics;  Laterality: Left;   REMOVAL OF TISSUE EXPANDER AND PLACEMENT OF IMPLANT Left 05/25/2019   Procedure: LEFT BREAST REMOVAL OF TISSUE EXPANDER AND PLACEMENT OF IMPLANT;  Surgeon: Lowery Estefana RAMAN, DO;  Location: [facility];  Service: Plastics;  Laterality: Left;   TISSUE EXPANDER PLACEMENT Left 11/03/2018   Procedure: PLACEMENT OF TISSUE EXPANDER LEFT BREAST;  Surgeon: Lowery Estefana RAMAN, DO;  Location: [facility];  Service: Plastics;  Laterality: Left;  Total case time is 3.5 hours   TUBAL LIGATION Bilateral 01/21/2011    Allergies  Allergen Reactions   Dilaudid  [Hydromorphone ] Itching    LMP 11/28/2016       No data to display               No data to display              Objective:  Physical Exam:  Gen: NAD, comfortable in exam room  MSK: Right foot Inspection: No bony abnormalities appreciated. There is mild swelling at the insertion point of of the Achilles onto the calcaneus.  Palpation: There is tenderness over the distal 2cm of the Achilles and insertion point of the Achilles onto the calcaneus. No tenderness to palpation of the proximal Achilles tendon.  ROM: FROM with plantarflexion/dorsiflexion and ankle inversion/eversion.  Neuro/Vasc: Neurovascularly intact distally.  Special Tests: Thompson test negative    Assessment & Plan:  1. Right Achilles tendonitis  - Patient's Achilles tendonitis is improved on exam today. She has been attending formal physical therapy, wearing heel lifts, and applying 1/2 nitroglycerin  patches daily. Discussed with patient that she can pursue Iontophoresis with Dexamethasone  during physical therapy, however, she should remove the nitroglycerin  patch during the session. Reminded patient that Achilles tendonitis can take several months to fully resolve and that shockwave therapy is an additional modality that is available to her if she wants to pursue it in the future. However, for the time being, will continue with current treatment regimen. Advised patient to follow-up in 6 weeks, but if she is continuing to improve, she can follow-up in 3 months instead.   - Continue physical therapy, can start Iontophoresis (remove Nitroglycerin  patch during treatment)  - Continue 1/2 Nitroglycerin  patches daily   - Continue home exercises   - Follow-up in 6 weeks, possible 3 months if continuing to improve   Signe Ravel, MS4 [facility]

## 2024-02-05 ENCOUNTER — Other Ambulatory Visit (HOSPITAL_COMMUNITY): Payer: Self-pay

## 2024-02-05 ENCOUNTER — Ambulatory Visit: Admitting: Physical Therapy

## 2024-02-05 DIAGNOSIS — M6281 Muscle weakness (generalized): Secondary | ICD-10-CM | POA: Diagnosis not present

## 2024-02-05 DIAGNOSIS — R600 Localized edema: Secondary | ICD-10-CM | POA: Diagnosis not present

## 2024-02-05 DIAGNOSIS — M25561 Pain in right knee: Secondary | ICD-10-CM | POA: Diagnosis not present

## 2024-02-05 DIAGNOSIS — R2689 Other abnormalities of gait and mobility: Secondary | ICD-10-CM | POA: Diagnosis not present

## 2024-02-05 DIAGNOSIS — G8929 Other chronic pain: Secondary | ICD-10-CM | POA: Diagnosis not present

## 2024-02-05 DIAGNOSIS — M25571 Pain in right ankle and joints of right foot: Secondary | ICD-10-CM | POA: Diagnosis not present

## 2024-02-05 DIAGNOSIS — Z483 Aftercare following surgery for neoplasm: Secondary | ICD-10-CM | POA: Diagnosis not present

## 2024-02-05 DIAGNOSIS — M25671 Stiffness of right ankle, not elsewhere classified: Secondary | ICD-10-CM

## 2024-02-05 NOTE — Therapy (Signed)
 OUTPATIENT PHYSICAL THERAPY LOWER EXTREMITY TREATMENT   Patient Name: Mackenzie [name] MRN: [id number] DOB:1971/01/30, 53 y.o., female Today's Date: 02/05/2024   PT End of Session - 02/05/24 1005     Visit Number 9    Date for PT Re-Evaluation 02/27/24    Authorization Type [employer] [facility]    PT Start Time 1005    PT Stop Time 1045    PT Time Calculation (min) 40 min    Activity Tolerance Patient tolerated treatment well    Behavior During Therapy [facility] for tasks assessed/performed            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, left (HCC) 01/23/2017   Cellulitis of right leg    Edema, lower extremity    GERD (gastroesophageal reflux disease)    Healthcare maintenance 03/25/2017   Herpesviral infection    Hot flashes 03/2017   Hyperlipidemia 03/20/2016   Impaired fasting glucose    Morbid obesity with body mass index (BMI) of 40.0 to 49.9 (HCC) 09/17/2017   Neoplasm of cervix    Osteoarthritis of knee    Other chest pain    Other specified disorders of veins    Personal history of chemotherapy    Personal history of radiation therapy    Polyneuropathy    Postmenopausal atrophic vaginitis    Pre-diabetes 07/26/2018   Hgb A1C elevated on 07/26/2018, Gestational Diabetes 2012   Pure hypercholesterolemia 03/30/2023   Seasonal allergies 2012   seasonal allergies causes allergic rhinitis and itchy, dry eyes per pt   Stress incontinence    Tachycardia    Thyrotoxicosis    Vitamin D  deficiency 05/2015   Past Surgical History:  Procedure Laterality Date   BREAST RECONSTRUCTION WITH PLACEMENT OF TISSUE EXPANDER AND FLEX HD (ACELLULAR HYDRATED DERMIS) Left 08/27/2017   Procedure: LEFT BREAST  RECONSTRUCTION WITH PLACEMENT OF TISSUE EXPANDER AND FLEX HD;  Surgeon: Lowery Estefana RAMAN, DO;  Location: [facility];  Service: Plastics;  Laterality: Left;   CESAREAN SECTION     x2   KNEE ARTHROSCOPY WITH MEDIAL MENISECTOMY Right 11/12/2022   Procedure: RIGHT KNEE ARTHROSCOPY, PARTIAL MEDIAL MENISCECTOMY;  Surgeon: Jerri Kay HERO, MD;  Location: [facility];  Service: Orthopedics;  Laterality: Right;   LATISSIMUS FLAP TO BREAST Left 11/03/2018   LATISSIMUS FLAP TO BREAST Left 11/03/2018   Procedure: LATISSIMUS FLAP TO LEFT BREAST;  Surgeon: Lowery Estefana RAMAN, DO;  Location: [facility];  Service: Plastics;  Laterality: Left;   MASTECTOMY MODIFIED RADICAL Left 08/27/2017   MASTECTOMY MODIFIED RADICAL Left 08/27/2017   Procedure: LEFT MODIFIED RADICAL MASTECTOMY;  Surgeon: Vanderbilt Ned, MD;  Location: [facility];  Service: General;  Laterality: Left;   MASTOPEXY Right 03/03/2019   Procedure: RIGHT BREAST MASTOPEXY/REDUCTION;  Surgeon: Lowery Estefana RAMAN, DO;  Location: [facility];  Service: Plastics;  Laterality: Right;  3 hours, please   PORT-A-CATH REMOVAL Right 08/27/2017   Procedure: REMOVAL PORT-A-CATH RIGHT CHEST;  Surgeon: Vanderbilt Ned, MD;  Location: [facility];  Service: General;  Laterality: Right;   PORTACATH PLACEMENT Right 01/28/2017   Procedure: INSERTION PORT-A-CATH WITH ULTRASOUND;  Surgeon: Vanderbilt Ned, MD;  Location:  SURGERY  CENTER;  Service: General;  Laterality: Right;   REDUCTION MAMMAPLASTY Right    REMOVAL OF TISSUE EXPANDER AND PLACEMENT OF IMPLANT Left 03/03/2019   Procedure: REMOVAL OF TISSUE EXPANDER AND PLACEMENT OF EXPANDER;  Surgeon: Lowery Estefana RAMAN, DO;  Location: [facility];  Service: Plastics;  Laterality: Left;   REMOVAL OF TISSUE EXPANDER AND PLACEMENT OF IMPLANT Left 05/25/2019   Procedure: LEFT BREAST REMOVAL OF TISSUE EXPANDER AND PLACEMENT OF IMPLANT;  Surgeon: Lowery Estefana RAMAN, DO;  Location: [facility];  Service: Plastics;  Laterality: Left;   TISSUE EXPANDER PLACEMENT Left 11/03/2018   Procedure: PLACEMENT OF TISSUE EXPANDER LEFT BREAST;  Surgeon: Lowery Estefana RAMAN, DO;  Location: [facility];  Service: Plastics;  Laterality: Left;  Total case time is 3.5 hours   TUBAL LIGATION Bilateral 01/21/2011   Patient Active Problem List   Diagnosis Date Noted   SOBOE (shortness of breath on exertion) 11/11/2023   Primary osteoarthritis of left knee 10/09/2023   Achilles tendinitis, right leg 10/09/2023   Graves disease 07/16/2023   Multinodular goiter 07/16/2023   Right carpal tunnel syndrome 07/10/2023   Left carpal tunnel syndrome 07/10/2023   Pain of Right Knee 07/06/2023   History of breast reconstruction 05/15/2023   Pure hypercholesterolemia 03/30/2023   Acute meniscal tear, medial, right, initial encounter 11/12/2022   Plica syndrome of right knee 11/12/2022   Chondromalacia, right knee 11/12/2022   Depression 08/13/2022   BMI 40.0-44.9, adult (HCC) 08/13/2022   Obesity, Beginning BMI 08/13/2022   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   Boil of buttock, Left 12/24/2021   Hyperthyroidism 07/22/2021   Low serum thyroid  stimulating hormone (TSH) 06/12/2021   Other fatigue 05/23/2021   Hemoglobin low 05/23/2021   Polyphagia 05/16/2020   Vitamin D  deficiency 05/16/2020   At risk for osteoporosis 05/16/2020   Prediabetes 05/01/2020   Elevated blood pressure reading 05/01/2020   At risk for impaired metabolic function 05/01/2020   Breast asymmetry following reconstructive surgery 09/20/2019   Essential hypertension 08/17/2018   Class 3 severe obesity with serious comorbidity and body mass index (BMI) of 40.0 to 44.9 in adult 09/17/2017   Acquired absence of left breast 09/15/2017   Malignant neoplasm of upper-inner quadrant of left breast in female, 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   Healthcare maintenance 03/25/2017   Malignant neoplasm of  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, estrogen receptor positive (HCC) 01/23/2017   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 No, Number of falls: 0  MOI/History of condition:  Onset date: January  SUBJECTIVE STATEMENT   Patient arrives to PT with minimal symptoms in R heel pain.  Saw MD and is cleared for ionto but needs to remove nitroglycerin  patch the day prior to OPPT   EVAL Ruthia C Gayman is a 53 y.o. female who presents to clinic with chief complaint of R posterior ankle pain.  Started in January with no specific inciting event.  Has  slowly worsened since that time.  The has used a boot as needed, she is using nitroglycerine patches, she ices.  She has some modest improvement but nothing significant.  Attempted eccentrics which were too painful.     Pain:  Are you having pain? 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  Education administrator: NA  Hand Dominance: NA  Patient Goals/Specific Activities: improved comfort with walking (10 min currently, was walking up to 3 miles/day), yard work   OBJECTIVE:   DIAGNOSTIC FINDINGS:  None relevant  GENERAL OBSERVATION/GAIT:  Antalgic gait with reduced time in stance on R  PALPATION: TTP insertion of R achilles tendon  LE  MMT:  MMT Right (Eval) Left (Eval) Right 01/20/24  Hip flexion (L2, L3)     Knee extension (L3) 4 4   Knee flexion 4 4   Hip abduction     Hip extension     Hip external rotation     Hip internal rotation     Hip adduction     Ankle dorsiflexion (L4) 5 5   Ankle plantarflexion (S1) Partial ROM SL eel raise unable   Ankle inversion 4 5 5   Ankle eversion 4 5 5   Great Toe ext (L5)     Grossly      (Blank rows = not tested, score listed is out of 5 possible points OR may be listed in lbs of force.  N = WNL, D = diminished, C = clear for gross weakness with myotome testing, * = concordant pain with testing)  LE ROM:  ROM Right (Eval) Left (Eval)  Hip flexion    Hip extension    Hip abduction    Hip adduction    Hip internal rotation    Hip external rotation    Knee extension    Knee flexion    Ankle dorsiflexion 10 10  Ankle plantarflexion 50 50  Ankle inversion 35 35  Ankle eversion 20 20   (Blank rows = not tested, N = WNL, * = concordant pain with testing)  Functional Tests  Eval 01/22/24   Progressive balance screen (highest level completed for >/= 10''):  Feet together: 10'' Semi Tandem: R in rear 10'', L in rear 10'' Tandem: R in rear 4'', L in rear 8'' SLS: R unable, L unable  SLS 28 sec right                                                         PATIENT SURVEYS:  LEFS: 33/80  TODAY'S TREATMENT: [facility] Adult PT Treatment:                                                DATE: 02/05/24 Therapeutic Exercise: Nustep L4 8 min for peripheral circulation Neuromuscular re-ed: Soleus heel raises with 15# DB 50x Heel raise from 4 in step 15x Runners step 4in step 15/15 Therapeutic Activity: PF stretch with towel 60s x2 Gastroc slant board stretch 60s Soleus slant board stretch 60s  [facility] Adult PT Treatment:  DATE: 02/01/24 Therapeutic Exercise: Seated heel raises Step stretch for gastroc 10 sec x 3   Bilat heel raise 30 sec x 3  Soleus heel raise 22 sec , 25 sec  Leg press 40# SL 10 x 3  Seated heel raise from 2 inch step 15# 15 sec x 3 R Wooden rocker A/P rocking Slant board stretch Self Care- concurrent with modalities Consider ionto  Resume more frequent ice applications   Modalities: Ice pack right heel x 8 minutes      [facility] Adult PT Treatment:                                                DATE: 01/28/24 Therapeutic Exercise: Towel scrunches - 1' x2 30'' heel raise isometrics - x4 KT tape for achilles  Therapeutic Activity  Cybex leg press - 40# bil x10, SL - 3x10 Standing hip abd machine - 25# - 15x ea Pt education regarding tendon response to exercise and goals of isometrics/tendon loading  Neuromuscular re-ed: Tandem on foam    [facility] Adult PT Treatment:                                                DATE: 01/22/24 Therapeutic Exercise: Seated toe scrunches and toe yoga Seated BTB PF x 20 Seated Inv GTB x 20 Seated gastroc and soleus stretch with towel  Seated heel raise on 2 inch step, 15# KB ,  5 sec x 10, 10 sec x 10  SLS 28 sec , min increased pain Right heel pad taping.       [facility] Adult PT Treatment:                                                DATE: 01/20/24 Therapeutic Exercise: Seated BTB PF Seated heel raise , 10 sec holds Seated heel raise on 2 inch step , 5 sec x 10 , added 10# to knee x 40  Supine DF GTB x 10 Right heel pad taping.      [facility] Adult PT Treatment:                                                DATE: 01-14-24 Therapeutic Exercise:  Reviewed HEP in detail with PT/pt demo Toe yoga Right Seated Ankle Plantar Flexion  with GTB Seated Plantar Fascia Stretch   Seated Arch Lifts and standing Seated Figure 4 Ankle Inversion with GTB   Clamshell with Resistance  GTB SLS  with UE on back of chair Seated Heel Raise   Alternate Long Sitting Ankle Inversion withGTB   Pt sometimes with difficult with Right ankle on Left  knee Long Sitting Ankle Dorsiflexion with RTB Manual Therapy: STW R plantar fascia R heel pad taping  [facility] Adult PT Treatment:  DATE: 01-06-24 Therapeutic Exercise: Seated Ankle Plantar Flexion  with GTB Seated Plantar Fascia Stretch   Seated Arch Lifts  Seated Figure 4 Ankle Inversion with GTB   Clamshell with Resistance  GTB SLS  at counter Seated Heel Raise   Alternate Long Sitting Ankle Inversion withGTB   Pt sometimes with difficult with Right ankle on Left knee Long Sitting Ankle Dorsiflexion with RTB Updated HEP Manual Therapy: STW R plantar fascia R heel pad taping  Self Care: Timeline of healing.  Progressive overload and adaptability of tissues Shoe wear and heel cup silicone cups for comfort for prolonged standing    EVAL Therapeutic Exercise: Creating, reviewing, and completing below HEP   PATIENT EDUCATION ([place]/[facility]):  POC, diagnosis, prognosis, HEP, and outcome measures.  Pt educated via explanation, demonstration, and handout (HEP).  Pt confirms understanding verbally.   HOME EXERCISE PROGRAM: Access Code: [email or web address] URL: [email or web address] Date: 01/06/2024 Prepared by: Graydon Dingwall  Program Notes toe Yoga  Exercises - Seated Ankle Plantar Flexion with Resistance Loop  - 1-2 x daily - 7 x weekly - 5 reps - 30-45 second hold - Seated Plantar Fascia Stretch  - 1 x daily - 7 x weekly - 1 sets - 3 reps - Seated Arch Lifts  - 1 x daily - 7 x weekly - 3 sets - 10 reps - 5 hold - Seated Figure 4 Ankle Inversion with Resistance  - 1 x daily - 7 x weekly - 3 sets - 10 reps - Clamshell with Resistance  - 1-15 x daily - 7 x weekly - 2 sets - 10 reps - Single Leg Stance  - 1 x daily - 7 x weekly - 1 sets - 4-5 reps - 30 to 60 seconds hold - Seated Heel Raise  - 1 x daily - 7 x weekly - 3 sets - 10 reps - Long Sitting Ankle Inversion with Resistance  - 1 x daily - 7 x weekly - 3 sets - 10 reps - 5  hold - Long Sitting Ankle Dorsiflexion with Anchored Resistance  - 1 x daily - 7 x weekly - 3 sets - 10 reps - 5 hold Treatment priorities   Eval 8/21       Progressive loading as tolerated Isometrics 30-45'' x4-5        R LE/hip strength                                 ASSESSMENT:  CLINICAL IMPRESSION: Focus of session was aerobic w/u followed by stretching and strengthening of gastroc/soleus complex.  Emphasized eccentric component of task.  Progressed to balance/proprioceptive training to assist with balance and stability.  Eval Mandy is a 53 y.o. female who presents to clinic with signs and sxs consistent with R posterior heel pain.  Consistent with physician impression of insertional achilles tendinopathy.   She is using a heel lift and nitro patches.  Tried eccentrics but this was too much load and agging.  Started with band isometrics and will progress as able.   Ananiah will benefit from skilled PT to address relevant deficits and improve comfort in walking and recreational activities.   OBJECTIVE IMPAIRMENTS: Pain, ankle ROM, gait, balance  ACTIVITY LIMITATIONS: walking, standing, housework, stairs  PERSONAL FACTORS: See medical history and pertinent history   REHAB POTENTIAL: Good  CLINICAL DECISION MAKING: Evolving/moderate complexity  EVALUATION COMPLEXITY: Moderate   GOALS:   SHORT TERM GOALS: Target date:  01/30/2024   Xavier will be >75% HEP compliant to improve carryover between sessions and facilitate independent management of condition  Evaluation: ongoing Goal status: ONGOING   LONG TERM GOALS: Target date: 02/27/2024   Vernetta will self report >/= 50% decrease in pain from evaluation to improve function in daily tasks  Evaluation/Baseline: 10/10 max pain Goal status: INITIAL   2.  Palin will show a >/= 20 pt improvement in LEFS score (MCID is ~11% or 9 pts) as a proxy for functional improvement   Evaluation/Baseline: 33/80 pts Goal  status: INITIAL   3.  Kayloni will be able to complete yard work, including mowing her grass, not limited by pain  Evaluation/Baseline: limited Goal status: INITIAL   4.  Angeli will be able to walk for 30 min, not limited by pain   Evaluation/Baseline: 10 min Goal status: INITIAL    5.  Annalee will be able to perform at least partial ROM SL heel raise on R, not limited by pain  Evaluation/Baseline: unable Goal status: INITIAL    PLAN: PT FREQUENCY: 1-2x/week  PT DURATION: 8 weeks  PLANNED INTERVENTIONS:  [id number]- PT Re-evaluation, 97110-Therapeutic exercises, 97530- Therapeutic activity, [id number]- Neuromuscular re-education, 97535- Self Care, [id number]- Manual therapy, [id number]- Gait training, [id number]- Aquatic Therapy, [phone]- Electrical stimulation (manual), [id number]- Vasopneumatic device, [id number]- Traction (mechanical), [id number]- Ionotophoresis 4mg /ml Dexamethasone , Taping, Dry Needling, Joint manipulation, and Spinal manipulation.   Reyes HERO Mathea Frieling PT 02/05/24 11:42 AM Phone: [phone] Fax: [phone]

## 2024-02-06 ENCOUNTER — Other Ambulatory Visit (HOSPITAL_COMMUNITY): Payer: Self-pay

## 2024-02-09 ENCOUNTER — Ambulatory Visit (INDEPENDENT_AMBULATORY_CARE_PROVIDER_SITE_OTHER): Admitting: Nurse Practitioner

## 2024-02-09 ENCOUNTER — Encounter (INDEPENDENT_AMBULATORY_CARE_PROVIDER_SITE_OTHER): Payer: Self-pay | Admitting: Nurse Practitioner

## 2024-02-09 VITALS — BP 145/85 | HR 98 | Ht 65.0 in | Wt 268.0 lb

## 2024-02-09 DIAGNOSIS — Z6841 Body Mass Index (BMI) 40.0 and over, adult: Secondary | ICD-10-CM | POA: Diagnosis not present

## 2024-02-09 DIAGNOSIS — R7303 Prediabetes: Secondary | ICD-10-CM

## 2024-02-09 DIAGNOSIS — E559 Vitamin D deficiency, unspecified: Secondary | ICD-10-CM

## 2024-02-09 DIAGNOSIS — E66813 Obesity, class 3: Secondary | ICD-10-CM

## 2024-02-09 DIAGNOSIS — K760 Fatty (change of) liver, not elsewhere classified: Secondary | ICD-10-CM | POA: Diagnosis not present

## 2024-02-09 DIAGNOSIS — I10 Essential (primary) hypertension: Secondary | ICD-10-CM

## 2024-02-09 NOTE — Progress Notes (Signed)
 Office: [phone]  /  Fax: [phone]  WEIGHT SUMMARY AND BIOMETRICS  Weight Lost Since Last Visit: 1lb  Weight Gained Since Last Visit: 0lb   Vitals BP: (!) 145/85 Pulse Rate: 98 SpO2: 97 %   Anthropometric Measurements Height: 5' 5 (1.651 m) Weight: 268 lb (121.6 kg) BMI (Calculated): 44.6 Weight at Last Visit: 269lb Weight Lost Since Last Visit: 1lb Weight Gained Since Last Visit: 0lb Starting Weight: 267lb Total Weight Loss (lbs): 0 lb (0 kg) Peak Weight: 320lb   Body Composition  Body Fat %: 50.7 % Fat Mass (lbs): 135.8 lbs Muscle Mass (lbs): 125.6 lbs Total Body Water (lbs): 90 lbs Visceral Fat Rating : 17   Other Clinical Data Fasting: No Labs: no Today's Visit #: 80 Starting Date: 08/09/19     HPI  Chief Complaint: OBESITY  Mackenzie [name] is here to discuss her progress with her obesity treatment plan. She is on the the Category 3 Plan and states she is following her eating plan approximately 65 % of the time. She states she is exercising 25-30 minutes 3 days per week.   Interval History:  Since last office visit she has been doing water aerobics a couple times a week at [facility]. Pool exercises are much more doable with tendonitis in right foot. She does believe she is getting 90 grams of protein daily.  She will still get an occasional soda. She denies skipping meals, will occasionally use a protein shake for dinner if it is late when she gets home.  She is currently on Metformin  XR 500 mg BID, Wegovy  is not covered by insurance but did well on this medication in the past. She also is on Wellbutrin  SR 200 mg every day for emotional eating behaviors. She does have history of hepatic steatosis and has been referred to GI by PCP. She has a history of multinodular thyroid  and is on Methimazole  5 mg every day for hyperthyroidism.  BP normally running in 120-130's/70-80s.  She did have to remove her compression socks and put them back on , so BP was  elevated. Denies headaches, chest pain, visual changes  and shortness of breath Hepatic steatosis history from 2020 ultrasound.  Her daughter turned 50 on the 14th and had several celebrations with family.   PHYSICAL EXAM:  Blood pressure (!) 145/85, pulse 98, height 5' 5 (1.651 m), weight 268 lb (121.6 kg), last menstrual period 11/28/2016, SpO2 97%, unknown if currently breastfeeding. Body mass index is 44.6 kg/m.  General: She is overweight, cooperative, alert, well developed, and in no acute distress. PSYCH: Has normal mood, affect and thought process.   Extremities: No edema.  Neurologic: No gross sensory or motor deficits. No tremors or fasciculations noted.    DIAGNOSTIC DATA REVIEWED:  Bioimpedence Increase 1.2 pounds of muscle and down 2.2 pounds of fat   BMET    Component Value Date/Time   NA 135 12/01/2023 0903   NA 139 09/15/2023 0916   NA 134 (L) 06/05/2017 1052   K 4.2 12/01/2023 0903   K 3.5 06/05/2017 1052   CL 103 12/01/2023 0903   CO2 25 12/01/2023 0903   CO2 25 06/05/2017 1052   GLUCOSE 104 (H) 12/01/2023 0903   GLUCOSE 278 (H) 06/05/2017 1052   BUN 21 (H) 12/01/2023 0903   BUN 15 09/15/2023 0916   BUN 12.0 06/05/2017 1052   CREATININE 0.89 12/01/2023 0903   CREATININE 0.9 06/05/2017 1052   CALCIUM  9.5 12/01/2023 0903   CALCIUM  9.3 06/05/2017 1052  GFRNONAA >60 12/01/2023 0903   GFRAA >60 01/09/2020 0827   Lab Results  Component Value Date   HGBA1C 5.9 (H) 09/15/2023   HGBA1C 5.9 (H) 07/26/2018   Lab Results  Component Value Date   INSULIN  14.7 09/15/2023   INSULIN  12.6 08/09/2019   Lab Results  Component Value Date   TSH 1.73 01/13/2024   CBC    Component Value Date/Time   WBC 5.7 12/01/2023 0903   WBC 3.8 (L) 07/23/2021 0815   RBC 4.61 12/01/2023 0903   HGB 12.6 12/01/2023 0903   HGB 14.0 06/08/2023 1036   HGB 10.6 (L) 06/05/2017 1052   HCT 38.0 12/01/2023 0903   HCT 44.4 06/08/2023 1036   HCT 33.0 (L) 06/05/2017 1052   PLT  225 12/01/2023 0903   PLT 285 06/08/2023 1036   MCV 82.4 12/01/2023 0903   MCV 86 06/08/2023 1036   MCV 86.6 06/05/2017 1052   MCH 27.3 12/01/2023 0903   MCHC 33.2 12/01/2023 0903   RDW 14.5 12/01/2023 0903   RDW 13.2 06/08/2023 1036   RDW 16.0 (H) 06/05/2017 1052   Iron Studies No results found for: IRON, TIBC, FERRITIN, IRONPCTSAT Lipid Panel     Component Value Date/Time   CHOL 197 06/08/2023 1036   TRIG 93 06/08/2023 1036   HDL 56 06/08/2023 1036   CHOLHDL 3.0 03/31/2023 1125   CHOLHDL 3.4 06/27/2020 0800   VLDL 7 06/27/2020 0800   LDLCALC 124 (H) 06/08/2023 1036   Hepatic Function Panel     Component Value Date/Time   PROT 8.0 12/01/2023 0903   PROT 7.2 09/15/2023 0916   PROT 7.2 06/05/2017 1052   ALBUMIN  4.4 12/01/2023 0903   ALBUMIN  4.3 09/15/2023 0916   ALBUMIN  3.7 06/05/2017 1052   AST 16 12/01/2023 0903   AST 19 06/05/2017 1052   ALT 22 12/01/2023 0903   ALT 28 06/05/2017 1052   ALKPHOS 118 12/01/2023 0903   ALKPHOS 95 06/05/2017 1052   BILITOT 0.4 12/01/2023 0903   BILITOT 0.26 06/05/2017 1052   BILIDIR <0.1 06/09/2023 0906      Component Value Date/Time   TSH 1.73 01/13/2024 1002   Nutritional Lab Results  Component Value Date   VD25OH 75.1 09/15/2023   VD25OH 65.8 06/08/2023   VD25OH 76.5 01/19/2023     ASSESSMENT AND PLAN  TREATMENT PLAN FOR OBESITY:  Recommended Dietary Goals  Mackenzie [name] is currently in the action stage of change. As such, her goal is to continue weight management plan. She has agreed to the Category 3 Plan.  Behavioral Intervention  We discussed the following Behavioral Modification Strategies today: continue to work on maintaining a reduced calorie state, getting the recommended amount of protein, incorporating whole foods, making healthy choices, staying well hydrated and practicing mindfulness when eating. and increase protein intake, fibrous foods (25 grams per day for women, 30 grams for men) and water to  improve satiety and decrease hunger signals. .  Additional resources provided today: NA  Recommended Physical Activity Goals  Jalynne has been advised to work up to 150 minutes of moderate intensity aerobic activity a week and strengthening exercises 2-3 times per week for cardiovascular health, weight loss maintenance and preservation of muscle mass.   She has agreed to Continue current level of physical activity , Think about enjoyable ways to increase daily physical activity and overcoming barriers to exercise, and Increase physical activity in their day and reduce sedentary time (increase NEAT). Water aerobics have been working well for her  as she has issues with right foot pain   Pharmacotherapy We discussed various medication options to help Valery with her weight loss efforts and we both agreed to continue Wellbutrin  and metformin  off label for weight loss: emotional eating and prediabetes.  ASSOCIATED CONDITIONS ADDRESSED TODAY  Action/Plan  Hepatic steatosis Continue to focus on category 3 meal plan Continue water aerobics 3 days a week Continue to focus on weight loss  Pre-diabetes Continue to focus on category 3 meal plan Continue to limit simple carbs Continue water aerobics 3 days a week Continue Metformin  XR 500 mg BID  Essential hypertension Continue Losartan  50 mg every day, Metoprolol  50 mg 1/2 tab every day and spironolactone  25 mg every day Continue to monitor BP at home, if consistently > 140/90 notify PCP  Vitamin D  deficiency Continue multivitamin daily- last value 75.1 on 09/15/23  Class 3 severe obesity with serious comorbidity and body mass index (BMI) of 40.0 to 44.9 in adult, unspecified obesity type Continue to focus on category 3 meal plan Continue water aerobics 3 days a week Continue to focus on weight loss Continue to replace dinner with protein shake on nights she gets home late      Return in about 4 weeks (around 03/08/2024).SABRA She was  informed of the importance of frequent follow up visits to maximize her success with intensive lifestyle modifications for her multiple health conditions.   ATTESTASTION STATEMENTS:  Reviewed by clinician on day of visit: allergies, medications, problem list, medical history, surgical history, family history, social history, and previous encounter notes.   Time spent on visit including pre-visit chart review and post-visit care and charting was 30 minutes.   Raimi Guillermo ANP-C

## 2024-02-10 ENCOUNTER — Encounter: Payer: Self-pay | Admitting: Physical Therapy

## 2024-02-10 ENCOUNTER — Ambulatory Visit: Attending: Student | Admitting: Physical Therapy

## 2024-02-10 DIAGNOSIS — M25571 Pain in right ankle and joints of right foot: Secondary | ICD-10-CM | POA: Insufficient documentation

## 2024-02-10 DIAGNOSIS — G8929 Other chronic pain: Secondary | ICD-10-CM | POA: Diagnosis present

## 2024-02-10 DIAGNOSIS — M25561 Pain in right knee: Secondary | ICD-10-CM | POA: Diagnosis present

## 2024-02-10 DIAGNOSIS — M25671 Stiffness of right ankle, not elsewhere classified: Secondary | ICD-10-CM | POA: Insufficient documentation

## 2024-02-10 NOTE — Therapy (Signed)
 OUTPATIENT PHYSICAL THERAPY LOWER EXTREMITY TREATMENT   Patient Name: Mackenzie [name] MRN: [id number] DOB:[birth year], 53 y.o., female Today's Date: 02/10/2024   PT End of Session - 02/10/24 0855     Visit Number 10    Date for PT Re-Evaluation 02/27/24    Authorization Type [employer]    PT Start Time [phone]   pt arrived late   PT Stop Time 0933    PT Time Calculation (min) 38 min    Activity Tolerance Patient tolerated treatment well    Behavior During Therapy [facility] for tasks assessed/performed            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, left (HCC) 01/23/2017   Cellulitis of right leg    Edema, lower extremity    GERD (gastroesophageal reflux disease)    Healthcare maintenance 03/25/2017   Herpesviral infection    Hot flashes 03/2017   Hyperlipidemia 03/20/2016   Impaired fasting glucose    Morbid obesity with body mass index (BMI) of 40.0 to 49.9 (HCC) 09/17/2017   Neoplasm of cervix    Osteoarthritis of knee    Other chest pain    Other specified disorders of veins    Personal history of chemotherapy    Personal history of radiation therapy    Polyneuropathy    Postmenopausal atrophic vaginitis    Pre-diabetes 07/26/2018   Hgb A1C elevated on 07/26/2018, Gestational Diabetes 2012   Pure hypercholesterolemia 03/30/2023   Seasonal allergies 2012   seasonal allergies causes allergic rhinitis and itchy, dry eyes per pt   Stress incontinence    Tachycardia    Thyrotoxicosis    Vitamin D  deficiency 05/2015   Past Surgical History:  Procedure Laterality Date   BREAST RECONSTRUCTION WITH PLACEMENT OF TISSUE EXPANDER AND FLEX HD (ACELLULAR HYDRATED DERMIS) Left 08/27/2017    Procedure: LEFT BREAST RECONSTRUCTION WITH PLACEMENT OF TISSUE EXPANDER AND FLEX HD;  Surgeon: Lowery Estefana RAMAN, DO;  Location: [facility];  Service: Plastics;  Laterality: Left;   CESAREAN SECTION     x2   KNEE ARTHROSCOPY WITH MEDIAL MENISECTOMY Right 11/12/2022   Procedure: RIGHT KNEE ARTHROSCOPY, PARTIAL MEDIAL MENISCECTOMY;  Surgeon: Jerri Kay HERO, MD;  Location: [facility];  Service: Orthopedics;  Laterality: Right;   LATISSIMUS FLAP TO BREAST Left 11/03/2018   LATISSIMUS FLAP TO BREAST Left 11/03/2018   Procedure: LATISSIMUS FLAP TO LEFT BREAST;  Surgeon: Lowery Estefana RAMAN, DO;  Location: [facility];  Service: Plastics;  Laterality: Left;   MASTECTOMY MODIFIED RADICAL Left 08/27/2017   MASTECTOMY MODIFIED RADICAL Left 08/27/2017   Procedure: LEFT MODIFIED RADICAL MASTECTOMY;  Surgeon: Vanderbilt Ned, MD;  Location: [facility];  Service: General;  Laterality: Left;   MASTOPEXY Right 03/03/2019   Procedure: RIGHT BREAST MASTOPEXY/REDUCTION;  Surgeon: Lowery Estefana RAMAN, DO;  Location: [facility];  Service: Plastics;  Laterality: Right;  3 hours, please   PORT-A-CATH REMOVAL Right 08/27/2017   Procedure: REMOVAL PORT-A-CATH RIGHT CHEST;  Surgeon: Vanderbilt Ned, MD;  Location: [facility];  Service: General;  Laterality: Right;   PORTACATH PLACEMENT Right 01/28/2017   Procedure: INSERTION PORT-A-CATH WITH ULTRASOUND;  Surgeon: Vanderbilt Ned, MD;  Location: [facility];  Service: General;  Laterality: Right;   REDUCTION MAMMAPLASTY Right    REMOVAL OF TISSUE EXPANDER AND PLACEMENT OF IMPLANT Left 03/03/2019   Procedure: REMOVAL OF TISSUE EXPANDER AND PLACEMENT OF EXPANDER;  Surgeon: Lowery Estefana RAMAN, DO;  Location: [facility];  Service: Plastics;  Laterality: Left;   REMOVAL OF TISSUE EXPANDER AND PLACEMENT OF IMPLANT Left 05/25/2019   Procedure: LEFT BREAST REMOVAL OF TISSUE EXPANDER AND PLACEMENT OF IMPLANT;  Surgeon: Lowery Estefana RAMAN, DO;   Location: [facility];  Service: Plastics;  Laterality: Left;   TISSUE EXPANDER PLACEMENT Left 11/03/2018   Procedure: PLACEMENT OF TISSUE EXPANDER LEFT BREAST;  Surgeon: Lowery Estefana RAMAN, DO;  Location: [facility];  Service: Plastics;  Laterality: Left;  Total case time is 3.5 hours   TUBAL LIGATION Bilateral 01/21/2011   Patient Active Problem List   Diagnosis Date Noted   SOBOE (shortness of breath on exertion) 11/11/2023   Primary osteoarthritis of left knee 10/09/2023   Achilles tendinitis, right leg 10/09/2023   Graves disease 07/16/2023   Multinodular goiter 07/16/2023   Right carpal tunnel syndrome 07/10/2023   Left carpal tunnel syndrome 07/10/2023   Pain of Right Knee 07/06/2023   History of breast reconstruction 05/15/2023   Pure hypercholesterolemia 03/30/2023   Acute meniscal tear, medial, right, initial encounter 11/12/2022   Plica syndrome of right knee 11/12/2022   Chondromalacia, right knee 11/12/2022   Depression 08/13/2022   BMI 40.0-44.9, adult (HCC) 08/13/2022   Obesity, Beginning BMI 08/13/2022   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   Boil of buttock, Left 12/24/2021   Hyperthyroidism 07/22/2021   Low serum thyroid  stimulating hormone (TSH) 06/12/2021   Other fatigue 05/23/2021   Hemoglobin low 05/23/2021   Polyphagia 05/16/2020   Vitamin D  deficiency 05/16/2020   At risk for osteoporosis 05/16/2020   Prediabetes 05/01/2020   Elevated blood pressure reading 05/01/2020   At risk for impaired metabolic function 05/01/2020   Breast asymmetry following reconstructive surgery 09/20/2019   Essential hypertension 08/17/2018   Class 3 severe obesity with serious comorbidity and body mass index (BMI) of 40.0 to 44.9 in adult 09/17/2017   Acquired absence of left breast 09/15/2017   Malignant neoplasm of upper-inner quadrant of left breast in female, 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   Healthcare maintenance 03/25/2017   Malignant  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, estrogen receptor positive (HCC) 01/23/2017   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 No, Number of falls: 0  MOI/History of condition:  Onset date: January  SUBJECTIVE STATEMENT   Pt reports some improvement in sxs.  She is a little more sore today d/t higher level of activity yesterday.   EVAL Dorlis C Burkley is a 53 y.o. female who presents to clinic with chief complaint of R posterior ankle pain.  Started in January with no specific  inciting event.  Has slowly worsened since that time.  The has used a boot as needed, she is using nitroglycerine patches, she ices.  She has some modest improvement but nothing significant.  Attempted eccentrics which were too painful.     Pain:  Are you having pain? 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  Education administrator: NA  Hand Dominance: NA  Patient Goals/Specific Activities: improved comfort with walking (10 min currently, was walking up to 3 miles/day), yard work   OBJECTIVE:   DIAGNOSTIC FINDINGS:  None relevant  GENERAL OBSERVATION/GAIT:  Antalgic gait with reduced time in stance on R  PALPATION: TTP insertion of R achilles  tendon  LE MMT:  MMT Right (Eval) Left (Eval) Right 01/20/24  Hip flexion (L2, L3)     Knee extension (L3) 4 4   Knee flexion 4 4   Hip abduction     Hip extension     Hip external rotation     Hip internal rotation     Hip adduction     Ankle dorsiflexion (L4) 5 5   Ankle plantarflexion (S1) Partial ROM SL eel raise unable   Ankle inversion 4 5 5   Ankle eversion 4 5 5   Great Toe ext (L5)     Grossly      (Blank rows = not tested, score listed is out of 5 possible points OR may be listed in lbs of force.  N = WNL, D = diminished, C = clear for gross weakness with myotome testing, * = concordant pain with testing)  LE ROM:  ROM Right (Eval) Left (Eval)  Hip flexion    Hip extension    Hip abduction    Hip adduction    Hip internal rotation    Hip external rotation    Knee extension    Knee flexion    Ankle dorsiflexion 10 10  Ankle plantarflexion 50 50  Ankle inversion 35 35  Ankle eversion 20 20   (Blank rows = not tested, N = WNL, * = concordant pain with testing)  Functional Tests  Eval 01/22/24   Progressive balance screen (highest level completed for >/= 10''):  Feet together: 10'' Semi Tandem: R in rear 10'', L in rear 10'' Tandem: R in rear 4'', L in rear 8'' SLS: R unable, L unable  SLS 28 sec right                                                         PATIENT SURVEYS:  LEFS: 33/80  TODAY'S TREATMENT: [facility] Adult PT Treatment:                                                DATE: 02/10/24  Therapeutic Exercise: Nustep L6 min 5 min Heel raise with 30'' hold from slight deficit x4 Gastroc slant board stretch 60s Knee ext machine - 2x10 - 10# Knee flexion machine - 2x10 - 20#  Therapeutic Activity: Runners step 6in step x10 w/ UE support R knee pain  Iontophoresis:  - 1 ml dexamethasone  - 4-6 hour slow release patch - placed R achilles   [facility] Adult PT Treatment:                                                DATE:  02/01/24 Therapeutic Exercise: Seated heel raises Step stretch for gastroc 10 sec x 3  Bilat heel raise 30 sec x 3  Soleus heel raise 22 sec , 25 sec  Leg press 40# SL 10 x 3  Seated heel raise from 2 inch step 15# 15 sec x 3 R Wooden rocker A/P rocking Slant board stretch Self Care- concurrent with modalities Consider ionto  Resume more frequent ice applications   Modalities: Ice pack right heel x 8 minutes      [facility] Adult PT Treatment:                                                DATE: 01/28/24 Therapeutic Exercise: Towel scrunches - 1' x2 30'' heel raise isometrics - x4 KT tape for achilles  Therapeutic Activity  Cybex leg press - 40# bil x10, SL - 3x10 Standing hip abd machine - 25# - 15x ea Pt education regarding tendon response to exercise and goals of isometrics/tendon loading  Neuromuscular re-ed: Tandem on foam    [facility] Adult PT Treatment:                                                DATE: 01/22/24 Therapeutic Exercise: Seated toe scrunches and toe yoga Seated BTB PF x 20 Seated Inv GTB x 20 Seated gastroc and soleus stretch with towel  Seated heel raise on 2 inch step, 15# KB ,  5 sec x 10, 10 sec x 10  SLS 28 sec , min increased pain Right heel pad taping.       [facility] Adult PT Treatment:                                                DATE: 01/20/24 Therapeutic Exercise: Seated BTB PF Seated heel raise , 10 sec holds Seated heel raise on 2 inch step , 5 sec x 10 , added 10# to knee x 40  Supine DF GTB x 10 Right heel pad taping.      [facility] Adult PT Treatment:                                                DATE: 01-14-24 Therapeutic Exercise:  Reviewed HEP in detail with PT/pt demo Toe yoga Right Seated Ankle Plantar Flexion  with GTB Seated Plantar Fascia Stretch   Seated Arch Lifts and standing Seated Figure 4 Ankle Inversion with GTB   Clamshell with Resistance  GTB SLS  with UE on back of chair Seated Heel Raise   Alternate  Long  Sitting Ankle Inversion withGTB   Pt sometimes with difficult with Right ankle on Left knee Long Sitting Ankle Dorsiflexion with RTB Manual Therapy: STW R plantar fascia R heel pad taping  [facility] Adult PT Treatment:                                                DATE: 01-06-24 Therapeutic Exercise: Seated Ankle Plantar Flexion  with GTB Seated Plantar Fascia Stretch   Seated Arch Lifts  Seated Figure 4 Ankle Inversion with GTB   Clamshell with Resistance  GTB SLS  at counter Seated Heel Raise   Alternate Long Sitting Ankle Inversion withGTB   Pt sometimes with difficult with Right ankle on Left knee Long Sitting Ankle Dorsiflexion with RTB Updated HEP Manual Therapy: STW R plantar fascia R heel pad taping  Self Care: Timeline of healing.  Progressive overload and adaptability of tissues Shoe wear and heel cup silicone cups for comfort for prolonged standing    EVAL Therapeutic Exercise: Creating, reviewing, and completing below HEP   PATIENT EDUCATION ([place]/[facility]):  POC, diagnosis, prognosis, HEP, and outcome measures.  Pt educated via explanation, demonstration, and handout (HEP).  Pt confirms understanding verbally.   HOME EXERCISE PROGRAM: Access Code: [email or web address] URL: [email or web address] Date: 02/10/2024 Prepared by: Helene Gasmen  Program Notes toe Yoga  Exercises - Seated Plantar Fascia Stretch  - 1 x daily - 7 x weekly - 1 sets - 3 reps - Seated Arch Lifts  - 1 x daily - 7 x weekly - 3 sets - 10 reps - 5 hold - Seated Figure 4 Ankle Inversion with Resistance  - 1 x daily - 7 x weekly - 3 sets - 10 reps - Single Leg Stance  - 1 x daily - 7 x weekly - 1 sets - 4-5 reps - 30 to 60 seconds hold - Heel Raises with Counter Support  - 1 x daily - 7 x weekly - 1 sets - 4-5 reps - 30-45'' hold   Eval 8/21       Progressive loading as tolerated Isometrics 30-45'' x4-5        R LE/hip strength                                 ASSESSMENT:  CLINICAL  IMPRESSION: Sugey tolerated session well with no adverse reaction.  Continued with focus on loading achilles tendon via isometrics with reinforcement via education that higher levels of loading drive tendon adaptations.  Trialed ionto and will gauge benefit next visit.  Eval Ikran is a 53 y.o. female who presents to clinic with signs and sxs consistent with R posterior heel pain.  Consistent with physician impression of insertional achilles tendinopathy.   She is using a heel lift and nitro patches.  Tried eccentrics but this was too much load and agging.  Started with band isometrics and will progress as able.   Monice will benefit from skilled PT to address relevant deficits and improve comfort in walking and recreational activities.   OBJECTIVE IMPAIRMENTS: Pain, ankle ROM, gait, balance  ACTIVITY LIMITATIONS: walking, standing, housework, stairs  PERSONAL FACTORS: See medical history and pertinent history   REHAB POTENTIAL: Good  CLINICAL DECISION MAKING: Evolving/moderate complexity  EVALUATION COMPLEXITY: Moderate   GOALS:   SHORT TERM  GOALS: Target date: 01/30/2024   Donnae will be >75% HEP compliant to improve carryover between sessions and facilitate independent management of condition  Evaluation: ongoing Goal status: ONGOING   LONG TERM GOALS: Target date: 02/27/2024   Iyanna will self report >/= 50% decrease in pain from evaluation to improve function in daily tasks  Evaluation/Baseline: 10/10 max pain Goal status: INITIAL   2.  Alaney will show a >/= 20 pt improvement in LEFS score (MCID is ~11% or 9 pts) as a proxy for functional improvement   Evaluation/Baseline: 33/80 pts Goal status: INITIAL   3.  Farheen will be able to complete yard work, including mowing her grass, not limited by pain  Evaluation/Baseline: limited Goal status: INITIAL   4.  Cathey will be able to walk for 30 min, not limited by pain   Evaluation/Baseline: 10 min Goal  status: INITIAL    5.  Tereka will be able to perform at least partial ROM SL heel raise on R, not limited by pain  Evaluation/Baseline: unable Goal status: INITIAL    PLAN: PT FREQUENCY: 1-2x/week  PT DURATION: 8 weeks  PLANNED INTERVENTIONS:  [id number]- PT Re-evaluation, 97110-Therapeutic exercises, 97530- Therapeutic activity, [id number]- Neuromuscular re-education, 97535- Self Care, [id number]- Manual therapy, [id number]- Gait training, [id number]- Aquatic Therapy, [id number]- Electrical stimulation (manual), [id number]- Vasopneumatic device, [id number]- Traction (mechanical), [id number]- Ionotophoresis 4mg /ml Dexamethasone , Taping, Dry Needling, Joint manipulation, and Spinal manipulation.   Helene BRAVO [place] Talcott PT 02/10/24 11:36 AM Phone: [phone] Fax: [phone]

## 2024-02-12 ENCOUNTER — Ambulatory Visit: Admitting: Physical Therapy

## 2024-02-12 ENCOUNTER — Encounter: Payer: Self-pay | Admitting: Physical Therapy

## 2024-02-12 DIAGNOSIS — M25561 Pain in right knee: Secondary | ICD-10-CM | POA: Diagnosis not present

## 2024-02-12 DIAGNOSIS — M25671 Stiffness of right ankle, not elsewhere classified: Secondary | ICD-10-CM

## 2024-02-12 DIAGNOSIS — M25571 Pain in right ankle and joints of right foot: Secondary | ICD-10-CM

## 2024-02-12 NOTE — Therapy (Signed)
 OUTPATIENT PHYSICAL THERAPY LOWER EXTREMITY TREATMENT   Patient Name: Mackenzie [name] MRN: [id number] DOB:30-Jan-1971, 53 y.o., female Today's Date: 02/12/2024   PT End of Session - 02/12/24 0849     Visit Number 11    Date for PT Re-Evaluation 02/27/24    Authorization Type [facility]    PT Start Time [id number]    PT Stop Time 0930    PT Time Calculation (min) 41 min            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, left (HCC) 01/23/2017   Cellulitis of right leg    Edema, lower extremity    GERD (gastroesophageal reflux disease)    Healthcare maintenance 03/25/2017   Herpesviral infection    Hot flashes 03/2017   Hyperlipidemia 03/20/2016   Impaired fasting glucose    Morbid obesity with body mass index (BMI) of 40.0 to 49.9 (HCC) 09/17/2017   Neoplasm of cervix    Osteoarthritis of knee    Other chest pain    Other specified disorders of veins    Personal history of chemotherapy    Personal history of radiation therapy    Polyneuropathy    Postmenopausal atrophic vaginitis    Pre-diabetes 07/26/2018   Hgb A1C elevated on 07/26/2018, Gestational Diabetes 2012   Pure hypercholesterolemia 03/30/2023   Seasonal allergies 2012   seasonal allergies causes allergic rhinitis and itchy, dry eyes per pt   Stress incontinence    Tachycardia    Thyrotoxicosis    Vitamin D  deficiency 05/2015   Past Surgical History:  Procedure Laterality Date   BREAST RECONSTRUCTION WITH PLACEMENT OF TISSUE EXPANDER AND FLEX HD (ACELLULAR HYDRATED DERMIS) Left 08/27/2017   Procedure: LEFT BREAST RECONSTRUCTION WITH PLACEMENT OF TISSUE EXPANDER AND FLEX HD;  Surgeon: Lowery Estefana RAMAN, DO;  Location: [facility];   Service: Plastics;  Laterality: Left;   CESAREAN SECTION     x2   KNEE ARTHROSCOPY WITH MEDIAL MENISECTOMY Right 11/12/2022   Procedure: RIGHT KNEE ARTHROSCOPY, PARTIAL MEDIAL MENISCECTOMY;  Surgeon: Jerri Kay HERO, MD;  Location: [facility];  Service: Orthopedics;  Laterality: Right;   LATISSIMUS FLAP TO BREAST Left 11/03/2018   LATISSIMUS FLAP TO BREAST Left 11/03/2018   Procedure: LATISSIMUS FLAP TO LEFT BREAST;  Surgeon: Lowery Estefana RAMAN, DO;  Location: [facility];  Service: Plastics;  Laterality: Left;   MASTECTOMY MODIFIED RADICAL Left 08/27/2017   MASTECTOMY MODIFIED RADICAL Left 08/27/2017   Procedure: LEFT MODIFIED RADICAL MASTECTOMY;  Surgeon: Vanderbilt Ned, MD;  Location: [facility];  Service: General;  Laterality: Left;   MASTOPEXY Right 03/03/2019   Procedure: RIGHT BREAST MASTOPEXY/REDUCTION;  Surgeon: Lowery Estefana RAMAN, DO;  Location: [facility];  Service: Plastics;  Laterality: Right;  3 hours, please   PORT-A-CATH REMOVAL Right 08/27/2017   Procedure: REMOVAL PORT-A-CATH RIGHT CHEST;  Surgeon: Vanderbilt Ned, MD;  Location: [facility];  Service: General;  Laterality: Right;   PORTACATH PLACEMENT Right 01/28/2017   Procedure: INSERTION PORT-A-CATH WITH ULTRASOUND;  Surgeon: Vanderbilt Ned, MD;  Location: [facility];  Service: General;  Laterality: Right;   REDUCTION MAMMAPLASTY Right    REMOVAL OF TISSUE EXPANDER  AND PLACEMENT OF IMPLANT Left 03/03/2019   Procedure: REMOVAL OF TISSUE EXPANDER AND PLACEMENT OF EXPANDER;  Surgeon: Lowery Estefana RAMAN, DO;  Location: [facility];  Service: Plastics;  Laterality: Left;   REMOVAL OF TISSUE EXPANDER AND PLACEMENT OF IMPLANT Left 05/25/2019   Procedure: LEFT BREAST REMOVAL OF TISSUE EXPANDER AND PLACEMENT OF IMPLANT;  Surgeon: Lowery Estefana RAMAN, DO;  Location: [facility];  Service: Plastics;  Laterality: Left;   TISSUE EXPANDER PLACEMENT Left 11/03/2018   Procedure:  PLACEMENT OF TISSUE EXPANDER LEFT BREAST;  Surgeon: Lowery Estefana RAMAN, DO;  Location: [facility];  Service: Plastics;  Laterality: Left;  Total case time is 3.5 hours   TUBAL LIGATION Bilateral 01/21/2011   Patient Active Problem List   Diagnosis Date Noted   SOBOE (shortness of breath on exertion) 11/11/2023   Primary osteoarthritis of left knee 10/09/2023   Achilles tendinitis, right leg 10/09/2023   Graves disease 07/16/2023   Multinodular goiter 07/16/2023   Right carpal tunnel syndrome 07/10/2023   Left carpal tunnel syndrome 07/10/2023   Pain of Right Knee 07/06/2023   History of breast reconstruction 05/15/2023   Pure hypercholesterolemia 03/30/2023   Acute meniscal tear, medial, right, initial encounter 11/12/2022   Plica syndrome of right knee 11/12/2022   Chondromalacia, right knee 11/12/2022   Depression 08/13/2022   BMI 40.0-44.9, adult (HCC) 08/13/2022   Obesity, Beginning BMI 08/13/2022   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   Boil of buttock, Left 12/24/2021   Hyperthyroidism 07/22/2021   Low serum thyroid  stimulating hormone (TSH) 06/12/2021   Other fatigue 05/23/2021   Hemoglobin low 05/23/2021   Polyphagia 05/16/2020   Vitamin D  deficiency 05/16/2020   At risk for osteoporosis 05/16/2020   Prediabetes 05/01/2020   Elevated blood pressure reading 05/01/2020   At risk for impaired metabolic function 05/01/2020   Breast asymmetry following reconstructive surgery 09/20/2019   Essential hypertension 08/17/2018   Class 3 severe obesity with serious comorbidity and body mass index (BMI) of 40.0 to 44.9 in adult 09/17/2017   Acquired absence of left breast 09/15/2017   Malignant neoplasm of upper-inner quadrant of left breast in female, 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   Healthcare maintenance 03/25/2017   Malignant 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, estrogen receptor positive (HCC) 01/23/2017   Carcinoma of breast metastatic  to axillary lymph node, left (HCC) 01/23/2017    PCP: [name], MD  REFERRING PROVIDER: Cleatrice Ludie SAUNDERS, MD  THERAPY DIAG:  Stiffness of right ankle, not elsewhere classified  Pain in right ankle and joints of right foot  REFERRING DIAG: Right Achilles tendinitis [M76.61]   Rationale for Evaluation and Treatment:  Rehabilitation  SUBJECTIVE:  PERTINENT PAST HISTORY:  Hx of breast cancer, R knee pain, L knee pain      PRECAUTIONS: None  WEIGHT BEARING RESTRICTIONS No  FALLS:  Has patient fallen in last 6 months? No, Number of falls: 0  MOI/History of condition:  Onset date: January  SUBJECTIVE STATEMENT Pt reports pain level is lower to a 5/10 on average. Going to the fair on Saturday so a lot of walking.    EVAL Izabel C Kassa is a 53 y.o. female who presents to clinic with chief complaint of R posterior ankle pain.  Started in January with no specific inciting event.  Has slowly worsened since that time.  The has used a boot as needed, she is using nitroglycerine patches, she ices.  She has some  modest improvement but nothing significant.  Attempted eccentrics which were too painful.     Pain:  Are you having pain? 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  Education administrator: NA  Hand Dominance: NA  Patient Goals/Specific Activities: improved comfort with walking (10 min currently, was walking up to 3 miles/day), yard work   OBJECTIVE:   DIAGNOSTIC FINDINGS:  None relevant  GENERAL OBSERVATION/GAIT:  Antalgic gait with reduced time in stance on R  PALPATION: TTP insertion of R achilles tendon  LE MMT:  MMT Right (Eval) Left (Eval) Right 01/20/24  Hip flexion (L2, L3)     Knee extension (L3) 4 4   Knee flexion 4 4   Hip abduction     Hip  extension     Hip external rotation     Hip internal rotation     Hip adduction     Ankle dorsiflexion (L4) 5 5   Ankle plantarflexion (S1) Partial ROM SL eel raise unable   Ankle inversion 4 5 5   Ankle eversion 4 5 5   Great Toe ext (L5)     Grossly      (Blank rows = not tested, score listed is out of 5 possible points OR may be listed in lbs of force.  N = WNL, D = diminished, C = clear for gross weakness with myotome testing, * = concordant pain with testing)  LE ROM:  ROM Right (Eval) Left (Eval)  Hip flexion    Hip extension    Hip abduction    Hip adduction    Hip internal rotation    Hip external rotation    Knee extension    Knee flexion    Ankle dorsiflexion 10 10  Ankle plantarflexion 50 50  Ankle inversion 35 35  Ankle eversion 20 20   (Blank rows = not tested, N = WNL, * = concordant pain with testing)  Functional Tests  Eval 01/22/24   Progressive balance screen (highest level completed for >/= 10''):  Feet together: 10'' Semi Tandem: R in rear 10'', L in rear 10'' Tandem: R in rear 4'', L in rear 8'' SLS: R unable, L unable  SLS 28 sec right                                                         PATIENT SURVEYS:  LEFS: 33/80  TODAY'S TREATMENT: [facility] Adult PT Treatment:                                                DATE: 02/12/24 Therapeutic Exercise: Nustep L6 min 5 min Bilat Heel raise with 30'' hold from slight deficit x 4 Bilat Soleus heel raise 30 hold on Leg press using 40#  x 4  Slant board stretch for gastroc and soleus 60 sec  Knee ext machine 10# bilat con, RLE ecc x 10 Knee flexion machine 20# bilat con, RLE ecc x 10 Modalities: Iontophoresis:  - 1 ml dexamethasone  - 4-6 hour slow release patch - placed  R achilles     [facility] Adult PT Treatment:                                                DATE: 02/10/24  Therapeutic Exercise: Nustep L6 min 5 min Heel raise with 30'' hold from slight deficit x 4 Gastroc slant board  stretch 60s Knee ext machine - 2x10 - 10# Knee flexion machine - 2x10 - 20#  Therapeutic Activity: Runners step 6in step x10 w/ UE support R knee pain   Iontophoresis:  - 1 ml dexamethasone  - 4-6 hour slow release patch - placed R achilles   [facility] Adult PT Treatment:                                                DATE: 02/01/24 Therapeutic Exercise: Seated heel raises Step stretch for gastroc 10 sec x 3  Bilat heel raise 30 sec x 3  Soleus heel raise 22 sec , 25 sec  Leg press 40# SL 10 x 3  Seated heel raise from 2 inch step 15# 15 sec x 3 R Wooden rocker A/P rocking Slant board stretch Self Care- concurrent with modalities Consider ionto  Resume more frequent ice applications   Modalities: Ice pack right heel x 8 minutes      [facility] Adult PT Treatment:                                                DATE: 01/28/24 Therapeutic Exercise: Towel scrunches - 1' x2 30'' heel raise isometrics - x4 KT tape for achilles  Therapeutic Activity  Cybex leg press - 40# bil x10, SL - 3x10 Standing hip abd machine - 25# - 15x ea Pt education regarding tendon response to exercise and goals of isometrics/tendon loading  Neuromuscular re-ed: Tandem on foam    [facility] Adult PT Treatment:                                                DATE: 01/22/24 Therapeutic Exercise: Seated toe scrunches and toe yoga Seated BTB PF x 20 Seated Inv GTB x 20 Seated gastroc and soleus stretch with towel  Seated heel raise on 2 inch step, 15# KB ,  5 sec x 10, 10 sec x 10  SLS 28 sec , min increased pain Right heel pad taping.       [facility] Adult PT Treatment:                                                DATE: 01/20/24 Therapeutic Exercise: Seated BTB PF Seated heel raise , 10 sec holds Seated heel raise on 2 inch step , 5 sec x 10 , added 10# to knee x 40  Supine DF GTB x 10 Right heel pad taping.  [facility] Adult PT Treatment:                                                DATE:  01-14-24 Therapeutic Exercise:  Reviewed HEP in detail with PT/pt demo Toe yoga Right Seated Ankle Plantar Flexion  with GTB Seated Plantar Fascia Stretch   Seated Arch Lifts and standing Seated Figure 4 Ankle Inversion with GTB   Clamshell with Resistance  GTB SLS  with UE on back of chair Seated Heel Raise   Alternate Long Sitting Ankle Inversion withGTB   Pt sometimes with difficult with Right ankle on Left knee Long Sitting Ankle Dorsiflexion with RTB Manual Therapy: STW R plantar fascia R heel pad taping  [facility] Adult PT Treatment:                                                DATE: 01-06-24 Therapeutic Exercise: Seated Ankle Plantar Flexion  with GTB Seated Plantar Fascia Stretch   Seated Arch Lifts  Seated Figure 4 Ankle Inversion with GTB   Clamshell with Resistance  GTB SLS  at counter Seated Heel Raise   Alternate Long Sitting Ankle Inversion withGTB   Pt sometimes with difficult with Right ankle on Left knee Long Sitting Ankle Dorsiflexion with RTB Updated HEP Manual Therapy: STW R plantar fascia R heel pad taping  Self Care: Timeline of healing.  Progressive overload and adaptability of tissues Shoe wear and heel cup silicone cups for comfort for prolonged standing    EVAL Therapeutic Exercise: Creating, reviewing, and completing below HEP   PATIENT EDUCATION ([facility]):  POC, diagnosis, prognosis, HEP, and outcome measures.  Pt educated via explanation, demonstration, and handout (HEP).  Pt confirms understanding verbally.   HOME EXERCISE PROGRAM: Access Code: [email or web address] URL: [email or web address] Date: 02/10/2024 Prepared by: Helene Gasmen  Program Notes toe Yoga  Exercises - Seated Plantar Fascia Stretch  - 1 x daily - 7 x weekly - 1 sets - 3 reps - Seated Arch Lifts  - 1 x daily - 7 x weekly - 3 sets - 10 reps - 5 hold - Seated Figure 4 Ankle Inversion with Resistance  - 1 x daily - 7 x weekly - 3 sets - 10 reps - Single Leg Stance   - 1 x daily - 7 x weekly - 1 sets - 4-5 reps - 30 to 60 seconds hold - Heel Raises with Counter Support  - 1 x daily - 7 x weekly - 1 sets - 4-5 reps - 30-45'' hold   Eval 8/21       Progressive loading as tolerated Isometrics 30-45'' x4-5        R LE/hip strength                                 ASSESSMENT:  CLINICAL IMPRESSION: Ladan tolerated session well with no adverse reaction.  Continued with focus on loading achilles tendon via isometrics with reinforcement via education that higher levels of loading drive tendon adaptations.  Repeated Ionto as pt reported positive response. Overall she reports 50% improvement in pain. She will do a lot of walking over the weekend at the  fair.   Eval Alashia is a 52 y.o. female who presents to clinic with signs and sxs consistent with R posterior heel pain.  Consistent with physician impression of insertional achilles tendinopathy.   She is using a heel lift and nitro patches.  Tried eccentrics but this was too much load and agging.  Started with band isometrics and will progress as able.   Nga will benefit from skilled PT to address relevant deficits and improve comfort in walking and recreational activities.   OBJECTIVE IMPAIRMENTS: Pain, ankle ROM, gait, balance  ACTIVITY LIMITATIONS: walking, standing, housework, stairs  PERSONAL FACTORS: See medical history and pertinent history   REHAB POTENTIAL: Good  CLINICAL DECISION MAKING: Evolving/moderate complexity  EVALUATION COMPLEXITY: Moderate   GOALS:   SHORT TERM GOALS: Target date: 01/30/2024   Sira will be >75% HEP compliant to improve carryover between sessions and facilitate independent management of condition  Evaluation: ongoing Goal status: MET    LONG TERM GOALS: Target date: 02/27/2024   Aubryn will self report >/= 50% decrease in pain from evaluation to improve function in daily tasks  Evaluation/Baseline: 10/10 max pain 02/12/24: 50% overall  improvement Goal status: MET    2.  Ansley will show a >/= 20 pt improvement in LEFS score (MCID is ~11% or 9 pts) as a proxy for functional improvement   Evaluation/Baseline: 33/80 pts Goal status: INITIAL   3.  Klaire will be able to complete yard work, including mowing her grass, not limited by pain  Evaluation/Baseline: limited Goal status: INITIAL   4.  Jenisa will be able to walk for 30 min, not limited by pain   Evaluation/Baseline: 10 min Goal status: INITIAL    5.  Coraleigh will be able to perform at least partial ROM SL heel raise on R, not limited by pain  Evaluation/Baseline: unable Goal status: INITIAL    PLAN: PT FREQUENCY: 1-2x/week  PT DURATION: 8 weeks  PLANNED INTERVENTIONS:  [id number]- PT Re-evaluation, 97110-Therapeutic exercises, 97530- Therapeutic activity, [id number]- Neuromuscular re-education, 97535- Self Care, [id number]- Manual therapy, [id number]- Gait training, [id number]- Aquatic Therapy, [id number]- Electrical stimulation (manual), [id number]- Vasopneumatic device, [id number]- Traction (mechanical), [id number]- Ionotophoresis 4mg /ml Dexamethasone , Taping, Dry Needling, Joint manipulation, and Spinal manipulation.   Harlene CHRISTELLA Persons PTA 02/12/24 10:17 AM Phone: [phone] Fax: [phone]

## 2024-02-15 ENCOUNTER — Encounter: Payer: Self-pay | Admitting: Physical Therapy

## 2024-02-15 ENCOUNTER — Ambulatory Visit: Admitting: Physical Therapy

## 2024-02-15 DIAGNOSIS — M25561 Pain in right knee: Secondary | ICD-10-CM | POA: Diagnosis not present

## 2024-02-15 DIAGNOSIS — M25671 Stiffness of right ankle, not elsewhere classified: Secondary | ICD-10-CM

## 2024-02-15 DIAGNOSIS — M25571 Pain in right ankle and joints of right foot: Secondary | ICD-10-CM

## 2024-02-15 NOTE — Therapy (Signed)
 OUTPATIENT PHYSICAL THERAPY LOWER EXTREMITY TREATMENT   Patient Name: Mackenzie [name] MRN: [id number] DOB:11-Mar-1971, 53 y.o., female Today's Date: 02/15/2024   PT End of Session - 02/15/24 0857     Visit Number 12    Date for PT Re-Evaluation 02/27/24    Authorization Type [facility]    PT Start Time [phone]   10 late   PT Stop Time 0928    PT Time Calculation (min) 33 min            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, left (HCC) 01/23/2017   Cellulitis of right leg    Edema, lower extremity    GERD (gastroesophageal reflux disease)    Healthcare maintenance 03/25/2017   Herpesviral infection    Hot flashes 03/2017   Hyperlipidemia 03/20/2016   Impaired fasting glucose    Morbid obesity with body mass index (BMI) of 40.0 to 49.9 (HCC) 09/17/2017   Neoplasm of cervix    Osteoarthritis of knee    Other chest pain    Other specified disorders of veins    Personal history of chemotherapy    Personal history of radiation therapy    Polyneuropathy    Postmenopausal atrophic vaginitis    Pre-diabetes 07/26/2018   Hgb A1C elevated on 07/26/2018, Gestational Diabetes 2012   Pure hypercholesterolemia 03/30/2023   Seasonal allergies 2012   seasonal allergies causes allergic rhinitis and itchy, dry eyes per pt   Stress incontinence    Tachycardia    Thyrotoxicosis    Vitamin D  deficiency 05/2015   Past Surgical History:  Procedure Laterality Date   BREAST RECONSTRUCTION WITH PLACEMENT OF TISSUE EXPANDER AND FLEX HD (ACELLULAR HYDRATED DERMIS) Left 08/27/2017   Procedure: LEFT BREAST RECONSTRUCTION WITH PLACEMENT OF TISSUE EXPANDER AND FLEX HD;  Surgeon: Lowery Estefana RAMAN, DO;  Location:  [facility];  Service: Plastics;  Laterality: Left;   CESAREAN SECTION     x2   KNEE ARTHROSCOPY WITH MEDIAL MENISECTOMY Right 11/12/2022   Procedure: RIGHT KNEE ARTHROSCOPY, PARTIAL MEDIAL MENISCECTOMY;  Surgeon: Jerri Kay HERO, MD;  Location: [facility];  Service: Orthopedics;  Laterality: Right;   LATISSIMUS FLAP TO BREAST Left 11/03/2018   LATISSIMUS FLAP TO BREAST Left 11/03/2018   Procedure: LATISSIMUS FLAP TO LEFT BREAST;  Surgeon: Lowery Estefana RAMAN, DO;  Location: [facility];  Service: Plastics;  Laterality: Left;   MASTECTOMY MODIFIED RADICAL Left 08/27/2017   MASTECTOMY MODIFIED RADICAL Left 08/27/2017   Procedure: LEFT MODIFIED RADICAL MASTECTOMY;  Surgeon: Vanderbilt Ned, MD;  Location: [facility];  Service: General;  Laterality: Left;   MASTOPEXY Right 03/03/2019   Procedure: RIGHT BREAST MASTOPEXY/REDUCTION;  Surgeon: Lowery Estefana RAMAN, DO;  Location: [facility];  Service: Plastics;  Laterality: Right;  3 hours, please   PORT-A-CATH REMOVAL Right 08/27/2017   Procedure: REMOVAL PORT-A-CATH RIGHT CHEST;  Surgeon: Vanderbilt Ned, MD;  Location: [facility];  Service: General;  Laterality: Right;   PORTACATH PLACEMENT Right 01/28/2017   Procedure: INSERTION PORT-A-CATH WITH ULTRASOUND;  Surgeon: Vanderbilt Ned, MD;  Location: [facility];  Service: General;  Laterality: Right;   REDUCTION MAMMAPLASTY Right    REMOVAL  OF TISSUE EXPANDER AND PLACEMENT OF IMPLANT Left 03/03/2019   Procedure: REMOVAL OF TISSUE EXPANDER AND PLACEMENT OF EXPANDER;  Surgeon: Lowery Estefana RAMAN, DO;  Location: [facility];  Service: Plastics;  Laterality: Left;   REMOVAL OF TISSUE EXPANDER AND PLACEMENT OF IMPLANT Left 05/25/2019   Procedure: LEFT BREAST REMOVAL OF TISSUE EXPANDER AND PLACEMENT OF IMPLANT;  Surgeon: Lowery Estefana RAMAN, DO;  Location: [facility];  Service: Plastics;  Laterality: Left;   TISSUE EXPANDER PLACEMENT Left 11/03/2018    Procedure: PLACEMENT OF TISSUE EXPANDER LEFT BREAST;  Surgeon: Lowery Estefana RAMAN, DO;  Location: [facility];  Service: Plastics;  Laterality: Left;  Total case time is 3.5 hours   TUBAL LIGATION Bilateral 01/21/2011   Patient Active Problem List   Diagnosis Date Noted   SOBOE (shortness of breath on exertion) 11/11/2023   Primary osteoarthritis of left knee 10/09/2023   Achilles tendinitis, right leg 10/09/2023   Graves disease 07/16/2023   Multinodular goiter 07/16/2023   Right carpal tunnel syndrome 07/10/2023   Left carpal tunnel syndrome 07/10/2023   Pain of Right Knee 07/06/2023   History of breast reconstruction 05/15/2023   Pure hypercholesterolemia 03/30/2023   Acute meniscal tear, medial, right, initial encounter 11/12/2022   Plica syndrome of right knee 11/12/2022   Chondromalacia, right knee 11/12/2022   Depression 08/13/2022   BMI 40.0-44.9, adult (HCC) 08/13/2022   Obesity, Beginning BMI 08/13/2022   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   Boil of buttock, Left 12/24/2021   Hyperthyroidism 07/22/2021   Low serum thyroid  stimulating hormone (TSH) 06/12/2021   Other fatigue 05/23/2021   Hemoglobin low 05/23/2021   Polyphagia 05/16/2020   Vitamin D  deficiency 05/16/2020   At risk for osteoporosis 05/16/2020   Prediabetes 05/01/2020   Elevated blood pressure reading 05/01/2020   At risk for impaired metabolic function 05/01/2020   Breast asymmetry following reconstructive surgery 09/20/2019   Essential hypertension 08/17/2018   Class 3 severe obesity with serious comorbidity and body mass index (BMI) of 40.0 to 44.9 in adult 09/17/2017   Acquired absence of left breast 09/15/2017   Malignant neoplasm of upper-inner quadrant of left breast in female, 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   Healthcare maintenance 03/25/2017   Malignant 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, estrogen receptor positive (HCC) 01/23/2017   Carcinoma of breast  metastatic to axillary lymph node, left (HCC) 01/23/2017    PCP: [name], MD  REFERRING PROVIDER: [name], MD  THERAPY DIAG:  Stiffness of right ankle, not elsewhere classified  Pain in right ankle and joints of right foot  REFERRING DIAG: Right Achilles tendinitis [M76.61]   Rationale for Evaluation and Treatment:  Rehabilitation  SUBJECTIVE:  PERTINENT PAST HISTORY:  Hx of breast cancer, R knee pain, L knee pain      PRECAUTIONS: None  WEIGHT BEARING RESTRICTIONS No  FALLS:  Has patient fallen in last 6 months? No, Number of falls: 0  MOI/History of condition:  Onset date: January  SUBJECTIVE STATEMENT Pt reports she did well walking over the weekend, more limited by knee pain. However, increased heel pain started right before appointment this morning 7-8/10 heel, 4/10 knees.   Pt reports pain level is lower to a 5/10 on average. Going [place] the fair on Saturday so a lot of walking.    EVAL Maciah C Galvis is a 52 y.o. female who presents to clinic with chief complaint of R posterior ankle pain.  Started  in January with no specific inciting event.  Has slowly worsened since that time.  The has used a boot as needed, she is using nitroglycerine patches, she ices.  She has some modest improvement but nothing significant.  Attempted eccentrics which were too painful.     Pain:  Are you having pain? 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  Education administrator: NA  Hand Dominance: NA  Patient Goals/Specific Activities: improved comfort with walking (10 min currently, was walking up to 3 miles/day), yard work   OBJECTIVE:   DIAGNOSTIC FINDINGS:  None relevant  GENERAL OBSERVATION/GAIT:  Antalgic gait with reduced time in stance on R  PALPATION: TTP  insertion of R achilles tendon  LE MMT:  MMT Right (Eval) Left (Eval) Right 01/20/24  Hip flexion (L2, L3)     Knee extension (L3) 4 4   Knee flexion 4 4   Hip abduction     Hip extension     Hip external rotation     Hip internal rotation     Hip adduction     Ankle dorsiflexion (L4) 5 5   Ankle plantarflexion (S1) Partial ROM SL eel raise unable   Ankle inversion 4 5 5   Ankle eversion 4 5 5   Great Toe ext (L5)     Grossly      (Blank rows = not tested, score listed is out of 5 possible points OR may be listed in lbs of force.  N = WNL, D = diminished, C = clear for gross weakness with myotome testing, * = concordant pain with testing)  LE ROM:  ROM Right (Eval) Left (Eval)  Hip flexion    Hip extension    Hip abduction    Hip adduction    Hip internal rotation    Hip external rotation    Knee extension    Knee flexion    Ankle dorsiflexion 10 10  Ankle plantarflexion 50 50  Ankle inversion 35 35  Ankle eversion 20 20   (Blank rows = not tested, N = WNL, * = concordant pain with testing)  Functional Tests  Eval 01/22/24   Progressive balance screen (highest level completed for >/= 10''):  Feet together: 10'' Semi Tandem: R in rear 10'', L in rear 10'' Tandem: R in rear 4'', L in rear 8'' SLS: R unable, L unable  SLS 28 sec right                                                         PATIENT SURVEYS:  LEFS: 33/80  TODAY'S TREATMENT: [facility] Adult PT Treatment:                                                DATE: 02/15/24 Therapeutic Exercise: Nustep L5 Le only x 5 minutes  Bilat Heel raise with 30'' hold from slight deficit x 4 (from edge of 2 inch step) Bilat soleus heel raise 22 sec hold, 30 sec, 30 sec Slant board stretch for gastroc and soleus  60 sec each Knee ext machine 20# bilat 10 x 2  Knee flexion machine 40# bilat 10 x 2  Modalities: Iontophoresis:  - 1 ml dexamethasone  - 4-6 hour slow release patch - placed R  achilles  Neuromuscular re-ed: AIREX pad Tandem stance 60 sec  R SLS on foam oval 10-20 sec x 3    [facility] Adult PT Treatment:                                                DATE: 02/12/24 Therapeutic Exercise: Nustep L6 min 5 min Bilat Heel raise with 30'' hold from slight deficit x 4 Bilat Soleus heel raise 30 hold on Leg press using 40#  x 4  Slant board stretch for gastroc and soleus 60 sec  Knee ext machine 10# bilat con, RLE ecc x 10 Knee flexion machine 20# bilat con, RLE ecc x 10 Modalities: Iontophoresis:  - 1 ml dexamethasone  - 4-6 hour slow release patch - placed R achilles     [facility] Adult PT Treatment:                                                DATE: 02/10/24  Therapeutic Exercise: Nustep L6 min 5 min Heel raise with 30'' hold from slight deficit x 4 Gastroc slant board stretch 60s Knee ext machine - 2x10 - 10# Knee flexion machine - 2x10 - 20#  Therapeutic Activity: Runners step 6in step x10 w/ UE support R knee pain   Iontophoresis:  - 1 ml dexamethasone  - 4-6 hour slow release patch - placed R achilles   [facility] Adult PT Treatment:                                                DATE: 02/01/24 Therapeutic Exercise: Seated heel raises Step stretch for gastroc 10 sec x 3  Bilat heel raise 30 sec x 3  Soleus heel raise 22 sec , 25 sec  Leg press 40# SL 10 x 3  Seated heel raise from 2 inch step 15# 15 sec x 3 R Wooden rocker A/P rocking Slant board stretch Self Care- concurrent with modalities Consider ionto  Resume more frequent ice applications   Modalities: Ice pack right heel x 8 minutes      [facility] Adult PT Treatment:                                                DATE: 01/28/24 Therapeutic Exercise: Towel scrunches - 1' x2 30'' heel raise isometrics - x4 KT tape for achilles  Therapeutic Activity  Cybex leg press - 40# bil x10, SL - 3x10 Standing hip abd machine - 25# - 15x ea Pt education regarding tendon response to exercise and goals  of isometrics/tendon loading  Neuromuscular re-ed: Tandem on foam    [facility] Adult PT Treatment:  DATE: 01/22/24 Therapeutic Exercise: Seated toe scrunches and toe yoga Seated BTB PF x 20 Seated Inv GTB x 20 Seated gastroc and soleus stretch with towel  Seated heel raise on 2 inch step, 15# KB ,  5 sec x 10, 10 sec x 10  SLS 28 sec , min increased pain Right heel pad taping.       [facility] Adult PT Treatment:                                                DATE: 01/20/24 Therapeutic Exercise: Seated BTB PF Seated heel raise , 10 sec holds Seated heel raise on 2 inch step , 5 sec x 10 , added 10# to knee x 40  Supine DF GTB x 10 Right heel pad taping.      [facility] Adult PT Treatment:                                                DATE: 01-14-24 Therapeutic Exercise:  Reviewed HEP in detail with PT/pt demo Toe yoga Right Seated Ankle Plantar Flexion  with GTB Seated Plantar Fascia Stretch   Seated Arch Lifts and standing Seated Figure 4 Ankle Inversion with GTB   Clamshell with Resistance  GTB SLS  with UE on back of chair Seated Heel Raise   Alternate Long Sitting Ankle Inversion withGTB   Pt sometimes with difficult with Right ankle on Left knee Long Sitting Ankle Dorsiflexion with RTB Manual Therapy: STW R plantar fascia R heel pad taping  [facility] Adult PT Treatment:                                                DATE: 01-06-24 Therapeutic Exercise: Seated Ankle Plantar Flexion  with GTB Seated Plantar Fascia Stretch   Seated Arch Lifts  Seated Figure 4 Ankle Inversion with GTB   Clamshell with Resistance  GTB SLS  at counter Seated Heel Raise   Alternate Long Sitting Ankle Inversion withGTB   Pt sometimes with difficult with Right ankle on Left knee Long Sitting Ankle Dorsiflexion with RTB Updated HEP Manual Therapy: STW R plantar fascia R heel pad taping  Self Care: Timeline of healing.  Progressive overload and  adaptability of tissues Shoe wear and heel cup silicone cups for comfort for prolonged standing    EVAL Therapeutic Exercise: Creating, reviewing, and completing below HEP   PATIENT EDUCATION ([place]/[facility]):  POC, diagnosis, prognosis, HEP, and outcome measures.  Pt educated via explanation, demonstration, and handout (HEP).  Pt confirms understanding verbally.   HOME EXERCISE PROGRAM: Access Code: [email or web address] URL: [email or web address] Date: 02/10/2024 Prepared by: Helene Gasmen  Program Notes toe Yoga  Exercises - Seated Plantar Fascia Stretch  - 1 x daily - 7 x weekly - 1 sets - 3 reps - Seated Arch Lifts  - 1 x daily - 7 x weekly - 3 sets - 10 reps - 5 hold - Seated Figure 4 Ankle Inversion with Resistance  - 1 x daily - 7 x weekly - 3 sets - 10 reps - Single Leg Stance  -  1 x daily - 7 x weekly - 1 sets - 4-5 reps - 30 to 60 seconds hold - Heel Raises with Counter Support  - 1 x daily - 7 x weekly - 1 sets - 4-5 reps - 30-45'' hold   Eval 8/21       Progressive loading as tolerated Isometrics 30-45'' x4-5        R LE/hip strength                                 ASSESSMENT:  CLINICAL IMPRESSION: Kriss tolerated session well with no adverse reaction. She reports no pain with walking at the fair over the weekend but she did have increased pain after driving here today. Continued with focus on loading achilles tendon via isometrics.   Repeated Ionto as pt reported positive response. She is still limited by knee pain. Did well with soleus heel raise isometrics today.   Eval Ramon is a 53 y.o. female who presents to clinic with signs and sxs consistent with R posterior heel pain.  Consistent with physician impression of insertional achilles tendinopathy.   She is using a heel lift and nitro patches.  Tried eccentrics but this was too much load and agging.  Started with band isometrics and will progress as able.   Tranice will benefit from skilled PT to address  relevant deficits and improve comfort in walking and recreational activities.   OBJECTIVE IMPAIRMENTS: Pain, ankle ROM, gait, balance  ACTIVITY LIMITATIONS: walking, standing, housework, stairs  PERSONAL FACTORS: See medical history and pertinent history   REHAB POTENTIAL: Good  CLINICAL DECISION MAKING: Evolving/moderate complexity  EVALUATION COMPLEXITY: Moderate   GOALS:   SHORT TERM GOALS: Target date: 01/30/2024   Jeralyn will be >75% HEP compliant to improve carryover between sessions and facilitate independent management of condition  Evaluation: ongoing Goal status: MET    LONG TERM GOALS: Target date: 02/27/2024   Destini will self report >/= 50% decrease in pain from evaluation to improve function in daily tasks  Evaluation/Baseline: 10/10 max pain 02/12/24: 50% overall improvement Goal status: MET    2.  Lavida will show a >/= 20 pt improvement in LEFS score (MCID is ~11% or 9 pts) as a proxy for functional improvement   Evaluation/Baseline: 33/80 pts Goal status: INITIAL   3.  Penne will be able to complete yard work, including mowing her grass, not limited by pain  Evaluation/Baseline: limited Goal status: INITIAL   4.  Aleshia will be able to walk for 30 min, not limited by pain   Evaluation/Baseline: 10 min Goal status: INITIAL    5.  Ailyn will be able to perform at least partial ROM SL heel raise on R, not limited by pain  Evaluation/Baseline: unable Goal status: INITIAL    PLAN: PT FREQUENCY: 1-2x/week  PT DURATION: 8 weeks  PLANNED INTERVENTIONS:  [id number]- PT Re-evaluation, 97110-Therapeutic exercises, 97530- Therapeutic activity, [id number]- Neuromuscular re-education, 97535- Self Care, [id number]- Manual therapy, [id number]- Gait training, [id number]- Aquatic Therapy, [id number]- Electrical stimulation (manual), [id number]- Vasopneumatic device, [id number]- Traction (mechanical), [id number]- Ionotophoresis 4mg /ml Dexamethasone , Taping, Dry Needling, Joint manipulation,  and Spinal manipulation.   Harlene Persons, PTA 02/15/24 9:31 AM Phone: [phone] Fax: [phone]

## 2024-02-19 ENCOUNTER — Ambulatory Visit: Admitting: Physical Therapy

## 2024-02-19 ENCOUNTER — Encounter: Payer: Self-pay | Admitting: Physical Therapy

## 2024-02-19 DIAGNOSIS — M25571 Pain in right ankle and joints of right foot: Secondary | ICD-10-CM

## 2024-02-19 DIAGNOSIS — M25671 Stiffness of right ankle, not elsewhere classified: Secondary | ICD-10-CM

## 2024-02-19 DIAGNOSIS — M25561 Pain in right knee: Secondary | ICD-10-CM | POA: Diagnosis not present

## 2024-02-19 DIAGNOSIS — G8929 Other chronic pain: Secondary | ICD-10-CM

## 2024-02-19 NOTE — Therapy (Signed)
 OUTPATIENT PHYSICAL THERAPY LOWER EXTREMITY TREATMENT   Patient Name: Mackenzie [name] MRN: [id number] DOB:1971-04-03, 53 y.o., female Today's Date: 02/19/2024   PT End of Session - 02/19/24 0845     Visit Number 13    Date for PT Re-Evaluation 02/27/24    Authorization Type [facility]    PT Start Time 0845    PT Stop Time 0926    PT Time Calculation (min) 41 min            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, left (HCC) 01/23/2017   Cellulitis of right leg    Edema, lower extremity    GERD (gastroesophageal reflux disease)    Healthcare maintenance 03/25/2017   Herpesviral infection    Hot flashes 03/2017   Hyperlipidemia 03/20/2016   Impaired fasting glucose    Morbid obesity with body mass index (BMI) of 40.0 to 49.9 (HCC) 09/17/2017   Neoplasm of cervix    Osteoarthritis of knee    Other chest pain    Other specified disorders of veins    Personal history of chemotherapy    Personal history of radiation therapy    Polyneuropathy    Postmenopausal atrophic vaginitis    Pre-diabetes 07/26/2018   Hgb A1C elevated on 07/26/2018, Gestational Diabetes 2012   Pure hypercholesterolemia 03/30/2023   Seasonal allergies 2012   seasonal allergies causes allergic rhinitis and itchy, dry eyes per pt   Stress incontinence    Tachycardia    Thyrotoxicosis    Vitamin D  deficiency 05/2015   Past Surgical History:  Procedure Laterality Date   BREAST RECONSTRUCTION WITH PLACEMENT OF TISSUE EXPANDER AND FLEX HD (ACELLULAR HYDRATED DERMIS) Left 08/27/2017   Procedure: LEFT BREAST RECONSTRUCTION WITH PLACEMENT OF TISSUE EXPANDER AND FLEX HD;  Surgeon: Lowery Estefana RAMAN, DO;  Location: [facility];   Service: Plastics;  Laterality: Left;   CESAREAN SECTION     x2   KNEE ARTHROSCOPY WITH MEDIAL MENISECTOMY Right 11/12/2022   Procedure: RIGHT KNEE ARTHROSCOPY, PARTIAL MEDIAL MENISCECTOMY;  Surgeon: Jerri Kay HERO, MD;  Location: [facility];  Service: Orthopedics;  Laterality: Right;   LATISSIMUS FLAP TO BREAST Left 11/03/2018   LATISSIMUS FLAP TO BREAST Left 11/03/2018   Procedure: LATISSIMUS FLAP TO LEFT BREAST;  Surgeon: Lowery Estefana RAMAN, DO;  Location: [facility];  Service: Plastics;  Laterality: Left;   MASTECTOMY MODIFIED RADICAL Left 08/27/2017   MASTECTOMY MODIFIED RADICAL Left 08/27/2017   Procedure: LEFT MODIFIED RADICAL MASTECTOMY;  Surgeon: Vanderbilt Ned, MD;  Location: [facility];  Service: General;  Laterality: Left;   MASTOPEXY Right 03/03/2019   Procedure: RIGHT BREAST MASTOPEXY/REDUCTION;  Surgeon: Lowery Estefana RAMAN, DO;  Location: [facility];  Service: Plastics;  Laterality: Right;  3 hours, please   PORT-A-CATH REMOVAL Right 08/27/2017   Procedure: REMOVAL PORT-A-CATH RIGHT CHEST;  Surgeon: Vanderbilt Ned, MD;  Location: [facility];  Service: General;  Laterality: Right;   PORTACATH PLACEMENT Right 01/28/2017   Procedure: INSERTION PORT-A-CATH WITH ULTRASOUND;  Surgeon: Vanderbilt Ned, MD;  Location: [facility];  Service: General;  Laterality: Right;   REDUCTION MAMMAPLASTY Right    REMOVAL OF TISSUE EXPANDER  AND PLACEMENT OF IMPLANT Left 03/03/2019   Procedure: REMOVAL OF TISSUE EXPANDER AND PLACEMENT OF EXPANDER;  Surgeon: Lowery Estefana RAMAN, DO;  Location: [facility];  Service: Plastics;  Laterality: Left;   REMOVAL OF TISSUE EXPANDER AND PLACEMENT OF IMPLANT Left 05/25/2019   Procedure: LEFT BREAST REMOVAL OF TISSUE EXPANDER AND PLACEMENT OF IMPLANT;  Surgeon: Lowery Estefana RAMAN, DO;  Location: [facility];  Service: Plastics;  Laterality: Left;   TISSUE EXPANDER PLACEMENT Left 11/03/2018   Procedure:  PLACEMENT OF TISSUE EXPANDER LEFT BREAST;  Surgeon: Lowery Estefana RAMAN, DO;  Location: [facility];  Service: Plastics;  Laterality: Left;  Total case time is 3.5 hours   TUBAL LIGATION Bilateral 01/21/2011   Patient Active Problem List   Diagnosis Date Noted   SOBOE (shortness of breath on exertion) 11/11/2023   Primary osteoarthritis of left knee 10/09/2023   Achilles tendinitis, right leg 10/09/2023   Graves disease 07/16/2023   Multinodular goiter 07/16/2023   Right carpal tunnel syndrome 07/10/2023   Left carpal tunnel syndrome 07/10/2023   Pain of Right Knee 07/06/2023   History of breast reconstruction 05/15/2023   Pure hypercholesterolemia 03/30/2023   Acute meniscal tear, medial, right, initial encounter 11/12/2022   Plica syndrome of right knee 11/12/2022   Chondromalacia, right knee 11/12/2022   Depression 08/13/2022   BMI 40.0-44.9, adult (HCC) 08/13/2022   Obesity, Beginning BMI 08/13/2022   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   Boil of buttock, Left 12/24/2021   Hyperthyroidism 07/22/2021   Low serum thyroid  stimulating hormone (TSH) 06/12/2021   Other fatigue 05/23/2021   Hemoglobin low 05/23/2021   Polyphagia 05/16/2020   Vitamin D  deficiency 05/16/2020   At risk for osteoporosis 05/16/2020   Prediabetes 05/01/2020   Elevated blood pressure reading 05/01/2020   At risk for impaired metabolic function 05/01/2020   Breast asymmetry following reconstructive surgery 09/20/2019   Essential hypertension 08/17/2018   Class 3 severe obesity with serious comorbidity and body mass index (BMI) of 40.0 to 44.9 in adult 09/17/2017   Acquired absence of left breast 09/15/2017   Malignant neoplasm of upper-inner quadrant of left breast in female, 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   Healthcare maintenance 03/25/2017   Malignant 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, estrogen receptor positive (HCC) 01/23/2017   Carcinoma of breast metastatic  to axillary lymph node, left (HCC) 01/23/2017    PCP: [name], MD  REFERRING PROVIDER: [name], MD  THERAPY DIAG:  Stiffness of right ankle, not elsewhere classified  Pain in right ankle and joints of right foot  Chronic pain of right knee  REFERRING DIAG: Right Achilles tendinitis [M76.61]   Rationale for Evaluation and Treatment:  Rehabilitation  SUBJECTIVE:  PERTINENT PAST HISTORY:  Hx of breast cancer, R knee pain, L knee pain      PRECAUTIONS: None  WEIGHT BEARING RESTRICTIONS No  FALLS:  Has patient fallen in last 6 months? No, Number of falls: 0  MOI/History of condition:  Onset date: January  SUBJECTIVE STATEMENT Pt reports that overall her ankle pain is improving.  She is able to walk and stand for longer periods without high levels of pain.   EVAL Jenisis C Wisdom is a 53 y.o. female who presents to clinic with chief complaint of R posterior ankle pain.  Started in January with no specific inciting event.  Has slowly worsened since that time.  The has used a boot as needed, she is using  nitroglycerine patches, she ices.  She has some modest improvement but nothing significant.  Attempted eccentrics which were too painful.     Pain:  Are you having pain? 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  Education administrator: NA  Hand Dominance: NA  Patient Goals/Specific Activities: improved comfort with walking (10 min currently, was walking up to 3 miles/day), yard work   OBJECTIVE:   DIAGNOSTIC FINDINGS:  None relevant  GENERAL OBSERVATION/GAIT:  Antalgic gait with reduced time in stance on R  PALPATION: TTP insertion of R achilles tendon  LE MMT:  MMT Right (Eval) Left (Eval) Right 01/20/24  Hip flexion (L2, L3)     Knee extension (L3)  4 4   Knee flexion 4 4   Hip abduction     Hip extension     Hip external rotation     Hip internal rotation     Hip adduction     Ankle dorsiflexion (L4) 5 5   Ankle plantarflexion (S1) Partial ROM SL eel raise unable   Ankle inversion 4 5 5   Ankle eversion 4 5 5   Great Toe ext (L5)     Grossly      (Blank rows = not tested, score listed is out of 5 possible points OR may be listed in lbs of force.  N = WNL, D = diminished, C = clear for gross weakness with myotome testing, * = concordant pain with testing)  LE ROM:  ROM Right (Eval) Left (Eval)  Hip flexion    Hip extension    Hip abduction    Hip adduction    Hip internal rotation    Hip external rotation    Knee extension    Knee flexion    Ankle dorsiflexion 10 10  Ankle plantarflexion 50 50  Ankle inversion 35 35  Ankle eversion 20 20   (Blank rows = not tested, N = WNL, * = concordant pain with testing)  Functional Tests  Eval 01/22/24   Progressive balance screen (highest level completed for >/= 10''):  Feet together: 10'' Semi Tandem: R in rear 10'', L in rear 10'' Tandem: R in rear 4'', L in rear 8'' SLS: R unable, L unable  SLS 28 sec right                                                         PATIENT SURVEYS:  LEFS: 33/80  TODAY'S TREATMENT: [facility] Adult PT Treatment:                                                DATE: 02/19/24 Therapeutic Exercise: Bike L5 LE only x 5 minutes  Bilat Heel raise with 30'' hold from slight deficit x 5 w/ R w/s to periods of SLS Knee ext machine 25# bilat 10 x 3 Knee flexion machine 40# bilat 10 x 3  Leg press - 3x10 - 50#  Modalities: Iontophoresis:  - 1 ml dexamethasone  - 4-6 hour slow release patch - placed R achilles  Neuromuscular re-ed: R SLS on foam 30 sec bouts with some UE support as needed x 3    [facility] Adult PT Treatment:                                                DATE: 02/12/24 Therapeutic Exercise: Nustep L6 min 5 min Bilat Heel  raise with 30'' hold from slight deficit x 4 Bilat Soleus heel raise 30 hold on Leg press using 40#  x 4  Slant board stretch for gastroc and soleus 60 sec  Knee ext machine 10# bilat con, RLE ecc x 10 Knee flexion machine 20# bilat con, RLE ecc x 10 Modalities: Iontophoresis:  - 1 ml dexamethasone  - 4-6 hour slow release patch - placed R achilles     [facility] Adult PT Treatment:                                                DATE: 02/10/24  Therapeutic Exercise: Nustep L6 min 5 min Heel raise with 30'' hold from slight deficit x 4 Gastroc slant board stretch 60s Knee ext machine - 2x10 - 10# Knee flexion machine - 2x10 - 20#  Therapeutic Activity: Runners step 6in step x10 w/ UE support R knee pain   Iontophoresis:  - 1 ml dexamethasone  - 4-6 hour slow release patch - placed R achilles   [facility] Adult PT Treatment:                                                DATE: 02/01/24 Therapeutic Exercise: Seated heel raises Step stretch for gastroc 10 sec x 3  Bilat heel raise 30 sec x 3  Soleus heel raise 22 sec , 25 sec  Leg press 40# SL 10 x 3  Seated heel raise from 2 inch step 15# 15 sec x 3 R Wooden rocker A/P rocking Slant board stretch Self Care- concurrent with modalities Consider ionto  Resume more frequent ice applications   Modalities: Ice pack right heel x 8 minutes      [facility] Adult PT Treatment:                                                DATE: 01/28/24 Therapeutic Exercise: Towel scrunches - 1' x2 30'' heel raise isometrics - x4 KT tape for achilles  Therapeutic Activity  Cybex leg press - 40# bil x10, SL - 3x10 Standing hip abd machine - 25# - 15x ea Pt education regarding tendon response to exercise and goals of isometrics/tendon loading  Neuromuscular re-ed: Tandem on foam    [facility] Adult PT Treatment:                                                DATE: 01/22/24 Therapeutic Exercise: Seated toe scrunches and toe yoga Seated BTB PF  x 20 Seated  Inv GTB x 20 Seated gastroc and soleus stretch with towel  Seated heel raise on 2 inch step, 15# KB ,  5 sec x 10, 10 sec x 10  SLS 28 sec , min increased pain Right heel pad taping.       [facility] Adult PT Treatment:                                                DATE: 01/20/24 Therapeutic Exercise: Seated BTB PF Seated heel raise , 10 sec holds Seated heel raise on 2 inch step , 5 sec x 10 , added 10# to knee x 40  Supine DF GTB x 10 Right heel pad taping.      [facility] Adult PT Treatment:                                                DATE: 01-14-24 Therapeutic Exercise:  Reviewed HEP in detail with PT/pt demo Toe yoga Right Seated Ankle Plantar Flexion  with GTB Seated Plantar Fascia Stretch   Seated Arch Lifts and standing Seated Figure 4 Ankle Inversion with GTB   Clamshell with Resistance  GTB SLS  with UE on back of chair Seated Heel Raise   Alternate Long Sitting Ankle Inversion withGTB   Pt sometimes with difficult with Right ankle on Left knee Long Sitting Ankle Dorsiflexion with RTB Manual Therapy: STW R plantar fascia R heel pad taping  [facility] Adult PT Treatment:                                                DATE: 01-06-24 Therapeutic Exercise: Seated Ankle Plantar Flexion  with GTB Seated Plantar Fascia Stretch   Seated Arch Lifts  Seated Figure 4 Ankle Inversion with GTB   Clamshell with Resistance  GTB SLS  at counter Seated Heel Raise   Alternate Long Sitting Ankle Inversion withGTB   Pt sometimes with difficult with Right ankle on Left knee Long Sitting Ankle Dorsiflexion with RTB Updated HEP Manual Therapy: STW R plantar fascia R heel pad taping  Self Care: Timeline of healing.  Progressive overload and adaptability of tissues Shoe wear and heel cup silicone cups for comfort for prolonged standing    EVAL Therapeutic Exercise: Creating, reviewing, and completing below HEP   PATIENT EDUCATION ([place]/[facility]):  POC, diagnosis, prognosis, HEP, and outcome  measures.  Pt educated via explanation, demonstration, and handout (HEP).  Pt confirms understanding verbally.   HOME EXERCISE PROGRAM: Access Code: [email or web address] URL: [email or web address] Date: 02/10/2024 Prepared by: Helene Gasmen  Program Notes toe Yoga  Exercises - Seated Plantar Fascia Stretch  - 1 x daily - 7 x weekly - 1 sets - 3 reps - Seated Arch Lifts  - 1 x daily - 7 x weekly - 3 sets - 10 reps - 5 hold - Seated Figure 4 Ankle Inversion with Resistance  - 1 x daily - 7 x weekly - 3 sets - 10 reps - Single Leg Stance  - 1 x daily - 7 x weekly - 1 sets - 4-5  reps - 30 to 60 seconds hold - Heel Raises with Counter Support  - 1 x daily - 7 x weekly - 1 sets - 4-5 reps - 30-45'' hold   Eval 8/21       Progressive loading as tolerated Isometrics 30-45'' x4-5        R LE/hip strength                                 ASSESSMENT:  CLINICAL IMPRESSION: Jaella tolerated session well with no adverse reaction. Pt report continued improvement in walking and standing tolerance.  Continued with progressive isometrics emphasizing w/s or even SLS on R today with pain scale of ~4/10 as guide.    Eval Leonarda is a 53 y.o. female who presents to clinic with signs and sxs consistent with R posterior heel pain.  Consistent with physician impression of insertional achilles tendinopathy.   She is using a heel lift and nitro patches.  Tried eccentrics but this was too much load and agging.  Started with band isometrics and will progress as able.   Terrilee will benefit from skilled PT to address relevant deficits and improve comfort in walking and recreational activities.   OBJECTIVE IMPAIRMENTS: Pain, ankle ROM, gait, balance  ACTIVITY LIMITATIONS: walking, standing, housework, stairs  PERSONAL FACTORS: See medical history and pertinent history   REHAB POTENTIAL: Good  CLINICAL DECISION MAKING: Evolving/moderate complexity  EVALUATION COMPLEXITY:  Moderate   GOALS:   SHORT TERM GOALS: Target date: 01/30/2024   Girlie will be >75% HEP compliant to improve carryover between sessions and facilitate independent management of condition  Evaluation: ongoing Goal status: MET    LONG TERM GOALS: Target date: 02/27/2024   Sybilla will self report >/= 50% decrease in pain from evaluation to improve function in daily tasks  Evaluation/Baseline: 10/10 max pain 02/12/24: 50% overall improvement Goal status: MET    2.  Chirstine will show a >/= 20 pt improvement in LEFS score (MCID is ~11% or 9 pts) as a proxy for functional improvement   Evaluation/Baseline: 33/80 pts Goal status: INITIAL   3.  Anzleigh will be able to complete yard work, including mowing her grass, not limited by pain  Evaluation/Baseline: limited Goal status: INITIAL   4.  Yael will be able to walk for 30 min, not limited by pain   Evaluation/Baseline: 10 min Goal status: INITIAL    5.  Yvanna will be able to perform at least partial ROM SL heel raise on R, not limited by pain  Evaluation/Baseline: unable Goal status: INITIAL    PLAN: PT FREQUENCY: 1-2x/week  PT DURATION: 8 weeks  PLANNED INTERVENTIONS:  [id number]- PT Re-evaluation, 97110-Therapeutic exercises, 97530- Therapeutic activity, [id number]- Neuromuscular re-education, 97535- Self Care, [id number]- Manual therapy, [id number]- Gait training, [id number]- Aquatic Therapy, [id number]- Electrical stimulation (manual), [id number]- Vasopneumatic device, [id number]- Traction (mechanical), [id number]- Ionotophoresis 4mg /ml Dexamethasone , Taping, Dry Needling, Joint manipulation, and Spinal manipulation.   Helene BRAVO Kydan Shanholtzer PT  02/19/24 9:28 AM Phone: [phone] Fax: [phone]

## 2024-02-22 ENCOUNTER — Encounter: Payer: Self-pay | Admitting: Physical Therapy

## 2024-02-22 ENCOUNTER — Ambulatory Visit: Admitting: Physical Therapy

## 2024-02-22 DIAGNOSIS — M25561 Pain in right knee: Secondary | ICD-10-CM | POA: Diagnosis not present

## 2024-02-22 DIAGNOSIS — M25571 Pain in right ankle and joints of right foot: Secondary | ICD-10-CM

## 2024-02-22 DIAGNOSIS — M25671 Stiffness of right ankle, not elsewhere classified: Secondary | ICD-10-CM

## 2024-02-22 NOTE — Therapy (Signed)
 OUTPATIENT PHYSICAL THERAPY LOWER EXTREMITY TREATMENT   Patient Name: Mackenzie [name] MRN: [id number] DOB:1971/05/09, 53 y.o., female Today's Date: 02/22/2024   PT End of Session - 02/22/24 0849     Visit Number 14    Date for PT Re-Evaluation 02/27/24    Authorization Type [facility]    PT Start Time 0848    PT Stop Time 0932    PT Time Calculation (min) 44 min            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, left (HCC) 01/23/2017   Cellulitis of right leg    Edema, lower extremity    GERD (gastroesophageal reflux disease)    Healthcare maintenance 03/25/2017   Herpesviral infection    Hot flashes 03/2017   Hyperlipidemia 03/20/2016   Impaired fasting glucose    Morbid obesity with body mass index (BMI) of 40.0 to 49.9 (HCC) 09/17/2017   Neoplasm of cervix    Osteoarthritis of knee    Other chest pain    Other specified disorders of veins    Personal history of chemotherapy    Personal history of radiation therapy    Polyneuropathy    Postmenopausal atrophic vaginitis    Pre-diabetes 07/26/2018   Hgb A1C elevated on 07/26/2018, Gestational Diabetes 2012   Pure hypercholesterolemia 03/30/2023   Seasonal allergies 2012   seasonal allergies causes allergic rhinitis and itchy, dry eyes per pt   Stress incontinence    Tachycardia    Thyrotoxicosis    Vitamin D  deficiency 05/2015   Past Surgical History:  Procedure Laterality Date   BREAST RECONSTRUCTION WITH PLACEMENT OF TISSUE EXPANDER AND FLEX HD (ACELLULAR HYDRATED DERMIS) Left 08/27/2017   Procedure: LEFT BREAST RECONSTRUCTION WITH PLACEMENT OF TISSUE EXPANDER AND FLEX HD;  Surgeon: Lowery Estefana RAMAN, DO;  Location: [facility];   Service: Plastics;  Laterality: Left;   CESAREAN SECTION     x2   KNEE ARTHROSCOPY WITH MEDIAL MENISECTOMY Right 11/12/2022   Procedure: RIGHT KNEE ARTHROSCOPY, PARTIAL MEDIAL MENISCECTOMY;  Surgeon: Jerri Kay HERO, MD;  Location: [facility];  Service: Orthopedics;  Laterality: Right;   LATISSIMUS FLAP TO BREAST Left 11/03/2018   LATISSIMUS FLAP TO BREAST Left 11/03/2018   Procedure: LATISSIMUS FLAP TO LEFT BREAST;  Surgeon: Lowery Estefana RAMAN, DO;  Location: [facility];  Service: Plastics;  Laterality: Left;   MASTECTOMY MODIFIED RADICAL Left 08/27/2017   MASTECTOMY MODIFIED RADICAL Left 08/27/2017   Procedure: LEFT MODIFIED RADICAL MASTECTOMY;  Surgeon: Vanderbilt Ned, MD;  Location: [facility];  Service: General;  Laterality: Left;   MASTOPEXY Right 03/03/2019   Procedure: RIGHT BREAST MASTOPEXY/REDUCTION;  Surgeon: Lowery Estefana RAMAN, DO;  Location: [facility];  Service: Plastics;  Laterality: Right;  3 hours, please   PORT-A-CATH REMOVAL Right 08/27/2017   Procedure: REMOVAL PORT-A-CATH RIGHT CHEST;  Surgeon: Vanderbilt Ned, MD;  Location: [facility];  Service: General;  Laterality: Right;   PORTACATH PLACEMENT Right 01/28/2017   Procedure: INSERTION PORT-A-CATH WITH ULTRASOUND;  Surgeon: Vanderbilt Ned, MD;  Location: [facility];  Service: General;  Laterality: Right;   REDUCTION MAMMAPLASTY Right    REMOVAL OF TISSUE EXPANDER  AND PLACEMENT OF IMPLANT Left 03/03/2019   Procedure: REMOVAL OF TISSUE EXPANDER AND PLACEMENT OF EXPANDER;  Surgeon: Lowery Estefana RAMAN, DO;  Location: [facility];  Service: Plastics;  Laterality: Left;   REMOVAL OF TISSUE EXPANDER AND PLACEMENT OF IMPLANT Left 05/25/2019   Procedure: LEFT BREAST REMOVAL OF TISSUE EXPANDER AND PLACEMENT OF IMPLANT;  Surgeon: Lowery Estefana RAMAN, DO;  Location: [facility];  Service: Plastics;  Laterality: Left;   TISSUE EXPANDER PLACEMENT Left 11/03/2018   Procedure:  PLACEMENT OF TISSUE EXPANDER LEFT BREAST;  Surgeon: Lowery Estefana RAMAN, DO;  Location: [facility];  Service: Plastics;  Laterality: Left;  Total case time is 3.5 hours   TUBAL LIGATION Bilateral 01/21/2011   Patient Active Problem List   Diagnosis Date Noted   SOBOE (shortness of breath on exertion) 11/11/2023   Primary osteoarthritis of left knee 10/09/2023   Achilles tendinitis, right leg 10/09/2023   Graves disease 07/16/2023   Multinodular goiter 07/16/2023   Right carpal tunnel syndrome 07/10/2023   Left carpal tunnel syndrome 07/10/2023   Pain of Right Knee 07/06/2023   History of breast reconstruction 05/15/2023   Pure hypercholesterolemia 03/30/2023   Acute meniscal tear, medial, right, initial encounter 11/12/2022   Plica syndrome of right knee 11/12/2022   Chondromalacia, right knee 11/12/2022   Depression 08/13/2022   BMI 40.0-44.9, adult (HCC) 08/13/2022   Obesity, Beginning BMI 08/13/2022   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   Boil of buttock, Left 12/24/2021   Hyperthyroidism 07/22/2021   Low serum thyroid  stimulating hormone (TSH) 06/12/2021   Other fatigue 05/23/2021   Hemoglobin low 05/23/2021   Polyphagia 05/16/2020   Vitamin D  deficiency 05/16/2020   At risk for osteoporosis 05/16/2020   Prediabetes 05/01/2020   Elevated blood pressure reading 05/01/2020   At risk for impaired metabolic function 05/01/2020   Breast asymmetry following reconstructive surgery 09/20/2019   Essential hypertension 08/17/2018   Class 3 severe obesity with serious comorbidity and body mass index (BMI) of 40.0 to 44.9 in adult 09/17/2017   Acquired absence of left breast 09/15/2017   Malignant neoplasm of upper-inner quadrant of left breast in female, 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   Healthcare maintenance 03/25/2017   Malignant 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, estrogen receptor positive (HCC) 01/23/2017   Carcinoma of breast metastatic  to axillary lymph node, left (HCC) 01/23/2017    PCP: [name], MD  REFERRING PROVIDER: [name], MD  THERAPY DIAG:  Stiffness of right ankle, not elsewhere classified  Pain in right ankle and joints of right foot  REFERRING DIAG: Right Achilles tendinitis [M76.61]   Rationale for Evaluation and Treatment:  Rehabilitation  SUBJECTIVE:  PERTINENT PAST HISTORY:  Hx of breast cancer, R knee pain, L knee pain      PRECAUTIONS: None  WEIGHT BEARING RESTRICTIONS No  FALLS:  Has patient fallen in last 6 months? No, Number of falls: 0  MOI/History of condition:  Onset date: January  SUBJECTIVE STATEMENT Achilles pain is doing better, but knees are hurting now, hurt more in the morning. Was at mall for 3 hours yesterday without achilles pain and knees were also better then.    EVAL Shelene C Eshleman is a 53 y.o. female who presents to clinic with chief complaint of R posterior ankle pain.  Started in January with no specific inciting event.  Has slowly worsened since that time.  The has used a boot as needed, she is  using nitroglycerine patches, she ices.  She has some modest improvement but nothing significant.  Attempted eccentrics which were too painful.     Pain:  Are you having pain? 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  Education administrator: NA  Hand Dominance: NA  Patient Goals/Specific Activities: improved comfort with walking (10 min currently, was walking up to 3 miles/day), yard work   OBJECTIVE:   DIAGNOSTIC FINDINGS:  None relevant  GENERAL OBSERVATION/GAIT:  Antalgic gait with reduced time in stance on R  PALPATION: TTP insertion of R achilles tendon  LE MMT:  MMT Right (Eval) Left (Eval) Right 01/20/24  Hip flexion (L2, L3)     Knee  extension (L3) 4 4   Knee flexion 4 4   Hip abduction     Hip extension     Hip external rotation     Hip internal rotation     Hip adduction     Ankle dorsiflexion (L4) 5 5   Ankle plantarflexion (S1) Partial ROM SL eel raise unable   Ankle inversion 4 5 5   Ankle eversion 4 5 5   Great Toe ext (L5)     Grossly      (Blank rows = not tested, score listed is out of 5 possible points OR may be listed in lbs of force.  N = WNL, D = diminished, C = clear for gross weakness with myotome testing, * = concordant pain with testing)  LE ROM:  ROM Right (Eval) Left (Eval)  Hip flexion    Hip extension    Hip abduction    Hip adduction    Hip internal rotation    Hip external rotation    Knee extension    Knee flexion    Ankle dorsiflexion 10 10  Ankle plantarflexion 50 50  Ankle inversion 35 35  Ankle eversion 20 20   (Blank rows = not tested, N = WNL, * = concordant pain with testing)  Functional Tests  Eval 01/22/24   Progressive balance screen (highest level completed for >/= 10''):  Feet together: 10'' Semi Tandem: R in rear 10'', L in rear 10'' Tandem: R in rear 4'', L in rear 8'' SLS: R unable, L unable  SLS 28 sec right                                                         PATIENT SURVEYS:  LEFS: 33/80 LEFS: 59/80  TODAY'S TREATMENT: [facility] Adult PT Treatment:                                                DATE: 02/22/24 Therapeutic Exercise: Nustep L5 LE only x 5 minutes  Unable to SL heel raise on either side  Bilat heel raise edge of step x 10 Bilat Heel raise with 30'' hold from slight deficit x 5 w/ R w/s to periods of SLS( edge of step) Gastroc stretch at edge of step x 2 each  Leg press 60# 10 x 3   Neuromuscular  re-ed: Neuromuscular re-ed: R SLS on foam 30 sec bouts with some UE support as needed x 3  A/P  blue rocker board without UE  Modalities: Iontophoresis:  - 1 ml dexamethasone  - 4-6 hour slow release patch - placed R  achilles     [facility] Adult PT Treatment:                                                DATE: 02/19/24 Therapeutic Exercise: Bike L5 LE only x 5 minutes  Bilat Heel raise with 30'' hold from slight deficit x 5 w/ R w/s to periods of SLS Knee ext machine 25# bilat 10 x 3 Knee flexion machine 40# bilat 10 x 3  Leg press - 3x10 - 50#  Modalities: Iontophoresis:  - 1 ml dexamethasone  - 4-6 hour slow release patch - placed R achilles  Neuromuscular re-ed: R SLS on foam 30 sec bouts with some UE support as needed x 3    [facility] Adult PT Treatment:                                                DATE: 02/12/24 Therapeutic Exercise: Nustep L6 min 5 min Bilat Heel raise with 30'' hold from slight deficit x 4 Bilat Soleus heel raise 30 hold on Leg press using 40#  x 4  Slant board stretch for gastroc and soleus 60 sec  Knee ext machine 10# bilat con, RLE ecc x 10 Knee flexion machine 20# bilat con, RLE ecc x 10 Modalities: Iontophoresis:  - 1 ml dexamethasone  - 4-6 hour slow release patch - placed R achilles     [facility] Adult PT Treatment:                                                DATE: 02/10/24  Therapeutic Exercise: Nustep L6 min 5 min Heel raise with 30'' hold from slight deficit x 4 Gastroc slant board stretch 60s Knee ext machine - 2x10 - 10# Knee flexion machine - 2x10 - 20#  Therapeutic Activity: Runners step 6in step x10 w/ UE support R knee pain   Iontophoresis:  - 1 ml dexamethasone  - 4-6 hour slow release patch - placed R achilles   [facility] Adult PT Treatment:                                                DATE: 02/01/24 Therapeutic Exercise: Seated heel raises Step stretch for gastroc 10 sec x 3  Bilat heel raise 30 sec x 3  Soleus heel raise 22 sec , 25 sec  Leg press 40# SL 10 x 3  Seated heel raise from 2 inch step 15# 15 sec x 3 R Wooden rocker A/P rocking Slant board stretch Self Care- concurrent with modalities Consider ionto  Resume more frequent ice  applications   Modalities: Ice pack right heel x 8 minutes      [facility] Adult PT Treatment:  DATE: 01/28/24 Therapeutic Exercise: Towel scrunches - 1' x2 30'' heel raise isometrics - x4 KT tape for achilles  Therapeutic Activity  Cybex leg press - 40# bil x10, SL - 3x10 Standing hip abd machine - 25# - 15x ea Pt education regarding tendon response to exercise and goals of isometrics/tendon loading  Neuromuscular re-ed: Tandem on foam    [facility] Adult PT Treatment:                                                DATE: 01/22/24 Therapeutic Exercise: Seated toe scrunches and toe yoga Seated BTB PF x 20 Seated Inv GTB x 20 Seated gastroc and soleus stretch with towel  Seated heel raise on 2 inch step, 15# KB ,  5 sec x 10, 10 sec x 10  SLS 28 sec , min increased pain Right heel pad taping.       [facility] Adult PT Treatment:                                                DATE: 01/20/24 Therapeutic Exercise: Seated BTB PF Seated heel raise , 10 sec holds Seated heel raise on 2 inch step , 5 sec x 10 , added 10# to knee x 40  Supine DF GTB x 10 Right heel pad taping.      [facility] Adult PT Treatment:                                                DATE: 01-14-24 Therapeutic Exercise:  Reviewed HEP in detail with PT/pt demo Toe yoga Right Seated Ankle Plantar Flexion  with GTB Seated Plantar Fascia Stretch   Seated Arch Lifts and standing Seated Figure 4 Ankle Inversion with GTB   Clamshell with Resistance  GTB SLS  with UE on back of chair Seated Heel Raise   Alternate Long Sitting Ankle Inversion withGTB   Pt sometimes with difficult with Right ankle on Left knee Long Sitting Ankle Dorsiflexion with RTB Manual Therapy: STW R plantar fascia R heel pad taping  [facility] Adult PT Treatment:                                                DATE: 01-06-24 Therapeutic Exercise: Seated Ankle Plantar Flexion  with GTB Seated Plantar Fascia  Stretch   Seated Arch Lifts  Seated Figure 4 Ankle Inversion with GTB   Clamshell with Resistance  GTB SLS  at counter Seated Heel Raise   Alternate Long Sitting Ankle Inversion withGTB   Pt sometimes with difficult with Right ankle on Left knee Long Sitting Ankle Dorsiflexion with RTB Updated HEP Manual Therapy: STW R plantar fascia R heel pad taping  Self Care: Timeline of healing.  Progressive overload and adaptability of tissues Shoe wear and heel cup silicone cups for comfort for prolonged standing    EVAL Therapeutic Exercise: Creating, reviewing, and completing below HEP   PATIENT EDUCATION ([facility]/HM):  POC,  diagnosis, prognosis, HEP, and outcome measures.  Pt educated via explanation, demonstration, and handout (HEP).  Pt confirms understanding verbally.   HOME EXERCISE PROGRAM: Access Code: [email or web address] URL: [email or web address] Date: 02/10/2024 Prepared by: Helene Gasmen  Program Notes toe Yoga  Exercises - Seated Plantar Fascia Stretch  - 1 x daily - 7 x weekly - 1 sets - 3 reps - Seated Arch Lifts  - 1 x daily - 7 x weekly - 3 sets - 10 reps - 5 hold - Seated Figure 4 Ankle Inversion with Resistance  - 1 x daily - 7 x weekly - 3 sets - 10 reps - Single Leg Stance  - 1 x daily - 7 x weekly - 1 sets - 4-5 reps - 30 to 60 seconds hold - Heel Raises with Counter Support  - 1 x daily - 7 x weekly - 1 sets - 4-5 reps - 30-45'' hold   Eval 8/21       Progressive loading as tolerated Isometrics 30-45'' x4-5        R LE/hip strength                                 ASSESSMENT:  CLINICAL IMPRESSION: Carmelita tolerated session well with no adverse reaction. Pt report continued improvement in walking and standing tolerance.  LEFS has improved. LTG# 2 and 4 met. She is unable to Single leg heel raise on either LE. Needs cues to maintain knee ext with gastroc strengthening. Is also limited by bilateral knee pain.   Continued with progressive isometrics  emphasizing w/ eccentric lowering on right. Ionto reapplied.    Eval Cherril is a 53 y.o. female who presents to clinic with signs and sxs consistent with R posterior heel pain.  Consistent with physician impression of insertional achilles tendinopathy.   She is using a heel lift and nitro patches.  Tried eccentrics but this was too much load and agging.  Started with band isometrics and will progress as able.   Heidy will benefit from skilled PT to address relevant deficits and improve comfort in walking and recreational activities.   OBJECTIVE IMPAIRMENTS: Pain, ankle ROM, gait, balance  ACTIVITY LIMITATIONS: walking, standing, housework, stairs  PERSONAL FACTORS: See medical history and pertinent history   REHAB POTENTIAL: Good  CLINICAL DECISION MAKING: Evolving/moderate complexity  EVALUATION COMPLEXITY: Moderate   GOALS:   SHORT TERM GOALS: Target date: 01/30/2024   Alaney will be >75% HEP compliant to improve carryover between sessions and facilitate independent management of condition  Evaluation: ongoing Goal status: MET    LONG TERM GOALS: Target date: 02/27/2024   Mertis will self report >/= 50% decrease in pain from evaluation to improve function in daily tasks  Evaluation/Baseline: 10/10 max pain 02/12/24: 50% overall improvement Goal status: MET    2.  Taisley will show a >/= 20 pt improvement in LEFS score (MCID is ~11% or 9 pts) as a proxy for functional improvement   Evaluation/Baseline: 33/80 pts 02/22/24: 59/80 Goal status: MET   3.  Keitha will be able to complete yard work, including mowing her grass, not limited by pain  Evaluation/Baseline: limited 02/22/24: havent tried Goal status: INITIAL   4.  Arlayne will be able to walk for 30 min, not limited by pain   Evaluation/Baseline: 10 min 02/22/24: on feet at mall for 3 hours  Goal status: MET    5.  Keondria will be able to  perform at least partial ROM SL heel raise on R, not limited by  pain  Evaluation/Baseline: unable Goal status: INITIAL    PLAN: PT FREQUENCY: 1-2x/week  PT DURATION: 8 weeks  PLANNED INTERVENTIONS:  [id number]- PT Re-evaluation, 97110-Therapeutic exercises, 97530- Therapeutic activity, [id number]- Neuromuscular re-education, 97535- Self Care, [id number]- Manual therapy, [id number]- Gait training, [id number]- Aquatic Therapy, [id number]- Electrical stimulation (manual), [id number]- Vasopneumatic device, [id number]- Traction (mechanical), [id number]- Ionotophoresis 4mg /ml Dexamethasone , Taping, Dry Needling, Joint manipulation, and Spinal manipulation.   Harlene CHRISTELLA Persons PTA  02/22/24 9:50 AM Phone: [phone] Fax: [phone]

## 2024-02-26 ENCOUNTER — Ambulatory Visit: Admitting: Physical Therapy

## 2024-02-26 ENCOUNTER — Encounter: Payer: Self-pay | Admitting: Physical Therapy

## 2024-02-26 ENCOUNTER — Other Ambulatory Visit (HOSPITAL_COMMUNITY): Payer: Self-pay

## 2024-02-26 DIAGNOSIS — M25561 Pain in right knee: Secondary | ICD-10-CM | POA: Diagnosis not present

## 2024-02-26 DIAGNOSIS — M25671 Stiffness of right ankle, not elsewhere classified: Secondary | ICD-10-CM

## 2024-02-26 DIAGNOSIS — M25571 Pain in right ankle and joints of right foot: Secondary | ICD-10-CM

## 2024-02-26 MED ORDER — LOSARTAN POTASSIUM 50 MG PO TABS
50.0000 mg | ORAL_TABLET | Freq: Every day | ORAL | 0 refills | Status: AC
  Filled 2024-02-26 – 2024-04-29 (×2): qty 90, 90d supply, fill #0

## 2024-02-26 MED ORDER — MONTELUKAST SODIUM 10 MG PO TABS
10.0000 mg | ORAL_TABLET | Freq: Every day | ORAL | 0 refills | Status: DC
  Filled 2024-02-26: qty 90, 90d supply, fill #0

## 2024-02-26 MED ORDER — ROSUVASTATIN CALCIUM 20 MG PO TABS
20.0000 mg | ORAL_TABLET | Freq: Every day | ORAL | 0 refills | Status: DC
  Filled 2024-02-26: qty 90, 90d supply, fill #0

## 2024-02-26 MED ORDER — SPIRONOLACTONE 25 MG PO TABS
25.0000 mg | ORAL_TABLET | Freq: Every day | ORAL | 0 refills | Status: DC
  Filled 2024-02-26: qty 90, 90d supply, fill #0

## 2024-02-26 NOTE — Therapy (Signed)
 OUTPATIENT PHYSICAL THERAPY LOWER EXTREMITY TREATMENT   Patient Name: Mackenzie [name] MRN: [id number] DOB:1970/07/19, 53 y.o., female Today's Date: 02/26/2024   PT End of Session - 02/26/24 0852     Visit Number 15    Date for Recertification  02/27/24    Authorization Type [employer]    PT Start Time 0850    PT Stop Time 0928    PT Time Calculation (min) 38 min            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, left (HCC) 01/23/2017   Cellulitis of right leg    Edema, lower extremity    GERD (gastroesophageal reflux disease)    Healthcare maintenance 03/25/2017   Herpesviral infection    Hot flashes 03/2017   Hyperlipidemia 03/20/2016   Impaired fasting glucose    Morbid obesity with body mass index (BMI) of 40.0 to 49.9 (HCC) 09/17/2017   Neoplasm of cervix    Osteoarthritis of knee    Other chest pain    Other specified disorders of veins    Personal history of chemotherapy    Personal history of radiation therapy    Polyneuropathy    Postmenopausal atrophic vaginitis    Pre-diabetes 07/26/2018   Hgb A1C elevated on 07/26/2018, Gestational Diabetes 2012   Pure hypercholesterolemia 03/30/2023   Seasonal allergies 2012   seasonal allergies causes allergic rhinitis and itchy, dry eyes per pt   Stress incontinence    Tachycardia    Thyrotoxicosis    Vitamin D  deficiency 05/2015   Past Surgical History:  Procedure Laterality Date   BREAST RECONSTRUCTION WITH PLACEMENT OF TISSUE EXPANDER AND FLEX HD (ACELLULAR HYDRATED DERMIS) Left 08/27/2017   Procedure: LEFT BREAST RECONSTRUCTION WITH PLACEMENT OF TISSUE EXPANDER AND FLEX HD;  Surgeon: Lowery Estefana RAMAN, DO;  Location: [facility];   Service: Plastics;  Laterality: Left;   CESAREAN SECTION     x2   KNEE ARTHROSCOPY WITH MEDIAL MENISECTOMY Right 11/12/2022   Procedure: RIGHT KNEE ARTHROSCOPY, PARTIAL MEDIAL MENISCECTOMY;  Surgeon: Jerri Kay HERO, MD;  Location: [facility];  Service: Orthopedics;  Laterality: Right;   LATISSIMUS FLAP TO BREAST Left 11/03/2018   LATISSIMUS FLAP TO BREAST Left 11/03/2018   Procedure: LATISSIMUS FLAP TO LEFT BREAST;  Surgeon: Lowery Estefana RAMAN, DO;  Location: [facility];  Service: Plastics;  Laterality: Left;   MASTECTOMY MODIFIED RADICAL Left 08/27/2017   MASTECTOMY MODIFIED RADICAL Left 08/27/2017   Procedure: LEFT MODIFIED RADICAL MASTECTOMY;  Surgeon: Vanderbilt Ned, MD;  Location: [facility];  Service: General;  Laterality: Left;   MASTOPEXY Right 03/03/2019   Procedure: RIGHT BREAST MASTOPEXY/REDUCTION;  Surgeon: Lowery Estefana RAMAN, DO;  Location: [facility];  Service: Plastics;  Laterality: Right;  3 hours, please   PORT-A-CATH REMOVAL Right 08/27/2017   Procedure: REMOVAL PORT-A-CATH RIGHT CHEST;  Surgeon: Vanderbilt Ned, MD;  Location: [facility];  Service: General;  Laterality: Right;   PORTACATH PLACEMENT Right 01/28/2017   Procedure: INSERTION PORT-A-CATH WITH ULTRASOUND;  Surgeon: Vanderbilt Ned, MD;  Location: [facility];  Service: General;  Laterality: Right;   REDUCTION MAMMAPLASTY Right    REMOVAL OF TISSUE EXPANDER  AND PLACEMENT OF IMPLANT Left 03/03/2019   Procedure: REMOVAL OF TISSUE EXPANDER AND PLACEMENT OF EXPANDER;  Surgeon: Lowery Estefana RAMAN, DO;  Location: [facility];  Service: Plastics;  Laterality: Left;   REMOVAL OF TISSUE EXPANDER AND PLACEMENT OF IMPLANT Left 05/25/2019   Procedure: LEFT BREAST REMOVAL OF TISSUE EXPANDER AND PLACEMENT OF IMPLANT;  Surgeon: Lowery Estefana RAMAN, DO;  Location: [name];  Service: Plastics;  Laterality: Left;   TISSUE EXPANDER PLACEMENT Left 11/03/2018   Procedure:  PLACEMENT OF TISSUE EXPANDER LEFT BREAST;  Surgeon: Lowery Estefana RAMAN, DO;  Location: [facility];  Service: Plastics;  Laterality: Left;  Total case time is 3.5 hours   TUBAL LIGATION Bilateral 01/21/2011   Patient Active Problem List   Diagnosis Date Noted   SOBOE (shortness of breath on exertion) 11/11/2023   Primary osteoarthritis of left knee 10/09/2023   Achilles tendinitis, right leg 10/09/2023   Graves disease 07/16/2023   Multinodular goiter 07/16/2023   Right carpal tunnel syndrome 07/10/2023   Left carpal tunnel syndrome 07/10/2023   Pain of Right Knee 07/06/2023   History of breast reconstruction 05/15/2023   Pure hypercholesterolemia 03/30/2023   Acute meniscal tear, medial, right, initial encounter 11/12/2022   Plica syndrome of right knee 11/12/2022   Chondromalacia, right knee 11/12/2022   Depression 08/13/2022   BMI 40.0-44.9, adult (HCC) 08/13/2022   Obesity, Beginning BMI 08/13/2022   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   Boil of buttock, Left 12/24/2021   Hyperthyroidism 07/22/2021   Low serum thyroid  stimulating hormone (TSH) 06/12/2021   Other fatigue 05/23/2021   Hemoglobin low 05/23/2021   Polyphagia 05/16/2020   Vitamin D  deficiency 05/16/2020   At risk for osteoporosis 05/16/2020   Prediabetes 05/01/2020   Elevated blood pressure reading 05/01/2020   At risk for impaired metabolic function 05/01/2020   Breast asymmetry following reconstructive surgery 09/20/2019   Essential hypertension 08/17/2018   Class 3 severe obesity with serious comorbidity and body mass index (BMI) of 40.0 to 44.9 in adult 09/17/2017   Acquired absence of left breast 09/15/2017   Malignant neoplasm of upper-inner quadrant of left breast in female, 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   Healthcare maintenance 03/25/2017   Malignant 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, estrogen receptor positive (HCC) 01/23/2017   Carcinoma of breast metastatic  to axillary lymph node, left (HCC) 01/23/2017    PCP: [name], MD  REFERRING PROVIDER: [name], MD  THERAPY DIAG:  Stiffness of right ankle, not elsewhere classified  Pain in right ankle and joints of right foot  REFERRING DIAG: Right Achilles tendinitis [M76.61]   Rationale for Evaluation and Treatment:  Rehabilitation  SUBJECTIVE:  PERTINENT PAST HISTORY:  Hx of breast cancer, R knee pain, L knee pain      PRECAUTIONS: None  WEIGHT BEARING RESTRICTIONS No  FALLS:  Has patient fallen in last 6 months? No, Number of falls: 0  MOI/History of condition:  Onset date: January  SUBJECTIVE STATEMENT Still having trouble with my knees mostly. The foot was hurting a little this morning.    EVAL Cabella C Kessner is a 52 y.o. female who presents to clinic with chief complaint of R posterior ankle pain.  Started in January with no specific inciting event.  Has slowly worsened since that time.  The has used a boot as needed, she is using nitroglycerine patches, she ices.  She has some modest improvement but nothing significant.  Attempted  eccentrics which were too painful.     Pain:  Are you having pain? 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  Education administrator: NA  Hand Dominance: NA  Patient Goals/Specific Activities: improved comfort with walking (10 min currently, was walking up to 3 miles/day), yard work   OBJECTIVE:   DIAGNOSTIC FINDINGS:  None relevant  GENERAL OBSERVATION/GAIT:  Antalgic gait with reduced time in stance on R  PALPATION: TTP insertion of R achilles tendon  LE MMT:  MMT Right (Eval) Left (Eval) Right 01/20/24  Hip flexion (L2, L3)     Knee extension (L3) 4 4   Knee flexion 4 4   Hip abduction     Hip extension     Hip  external rotation     Hip internal rotation     Hip adduction     Ankle dorsiflexion (L4) 5 5   Ankle plantarflexion (S1) Partial ROM SL eel raise unable   Ankle inversion 4 5 5   Ankle eversion 4 5 5   Great Toe ext (L5)     Grossly      (Blank rows = not tested, score listed is out of 5 possible points OR may be listed in lbs of force.  N = WNL, D = diminished, C = clear for gross weakness with myotome testing, * = concordant pain with testing)  LE ROM:  ROM Right (Eval) Left (Eval)  Hip flexion    Hip extension    Hip abduction    Hip adduction    Hip internal rotation    Hip external rotation    Knee extension    Knee flexion    Ankle dorsiflexion 10 10  Ankle plantarflexion 50 50  Ankle inversion 35 35  Ankle eversion 20 20   (Blank rows = not tested, N = WNL, * = concordant pain with testing)  Functional Tests  Eval 01/22/24   Progressive balance screen (highest level completed for >/= 10''):  Feet together: 10'' Semi Tandem: R in rear 10'', L in rear 10'' Tandem: R in rear 4'', L in rear 8'' SLS: R unable, L unable  SLS 28 sec right                                                         PATIENT SURVEYS:  LEFS: 33/80 LEFS: 59/80  TODAY'S TREATMENT: [facility] Adult PT Treatment:                                                DATE: 02/26/24 Therapeutic Exercise: SLR x 10 each  Hip abduction x 10 each  Hamstring stretch active supine Bilat heel raise edge of step 2 x 8 Gastroc stretch at edge of step x 2 each  Leg press 80# 10 x 2   Neuromuscular re-ed: Tandem on foam RLE back SLS on Foam oval x 2 Right    [facility] Adult PT Treatment:  DATE: 02/22/24 Therapeutic Exercise: Nustep L5 LE only x 5 minutes  Unable to SL heel raise on either side  Bilat heel raise edge of step x 10 Bilat Heel raise with 30'' hold from slight deficit x 5 w/ R w/s to periods of SLS( edge of step) Gastroc stretch at edge of  step x 2 each  Leg press 60# 10 x 3   Neuromuscular re-ed: Neuromuscular re-ed: R SLS on foam 30 sec bouts with some UE support as needed x 3  A/P  blue rocker board without UE  Modalities: Iontophoresis:  - 1 ml dexamethasone  - 4-6 hour slow release patch - placed R achilles     [facility] Adult PT Treatment:                                                DATE: 02/19/24 Therapeutic Exercise: Bike L5 LE only x 5 minutes  Bilat Heel raise with 30'' hold from slight deficit x 5 w/ R w/s to periods of SLS Knee ext machine 25# bilat 10 x 3 Knee flexion machine 40# bilat 10 x 3  Leg press - 3x10 - 50#  Modalities: Iontophoresis:  - 1 ml dexamethasone  - 4-6 hour slow release patch - placed R achilles  Neuromuscular re-ed: R SLS on foam 30 sec bouts with some UE support as needed x 3    [facility] Adult PT Treatment:                                                DATE: 02/12/24 Therapeutic Exercise: Nustep L6 min 5 min Bilat Heel raise with 30'' hold from slight deficit x 4 Bilat Soleus heel raise 30 hold on Leg press using 40#  x 4  Slant board stretch for gastroc and soleus 60 sec  Knee ext machine 10# bilat con, RLE ecc x 10 Knee flexion machine 20# bilat con, RLE ecc x 10 Modalities: Iontophoresis:  - 1 ml dexamethasone  - 4-6 hour slow release patch - placed R achilles     [facility] Adult PT Treatment:                                                DATE: 02/10/24  Therapeutic Exercise: Nustep L6 min 5 min Heel raise with 30'' hold from slight deficit x 4 Gastroc slant board stretch 60s Knee ext machine - 2x10 - 10# Knee flexion machine - 2x10 - 20#  Therapeutic Activity: Runners step 6in step x10 w/ UE support R knee pain   Iontophoresis:  - 1 ml dexamethasone  - 4-6 hour slow release patch - placed R achilles   [facility] Adult PT Treatment:                                                DATE: 02/01/24 Therapeutic Exercise: Seated heel raises Step stretch for gastroc 10 sec  x 3  Bilat heel raise 30 sec x 3  Soleus heel  raise 22 sec , 25 sec  Leg press 40# SL 10 x 3  Seated heel raise from 2 inch step 15# 15 sec x 3 R Wooden rocker A/P rocking Slant board stretch Self Care- concurrent with modalities Consider ionto  Resume more frequent ice applications   Modalities: Ice pack right heel x 8 minutes      [facility] Adult PT Treatment:                                                DATE: 01/28/24 Therapeutic Exercise: Towel scrunches - 1' x2 30'' heel raise isometrics - x4 KT tape for achilles  Therapeutic Activity  Cybex leg press - 40# bil x10, SL - 3x10 Standing hip abd machine - 25# - 15x ea Pt education regarding tendon response to exercise and goals of isometrics/tendon loading  Neuromuscular re-ed: Tandem on foam    [facility] Adult PT Treatment:                                                DATE: 01/22/24 Therapeutic Exercise: Seated toe scrunches and toe yoga Seated BTB PF x 20 Seated Inv GTB x 20 Seated gastroc and soleus stretch with towel  Seated heel raise on 2 inch step, 15# KB ,  5 sec x 10, 10 sec x 10  SLS 28 sec , min increased pain Right heel pad taping.       [facility] Adult PT Treatment:                                                DATE: 01/20/24 Therapeutic Exercise: Seated BTB PF Seated heel raise , 10 sec holds Seated heel raise on 2 inch step , 5 sec x 10 , added 10# to knee x 40  Supine DF GTB x 10 Right heel pad taping.      [facility] Adult PT Treatment:                                                DATE: 01-14-24 Therapeutic Exercise:  Reviewed HEP in detail with PT/pt demo Toe yoga Right Seated Ankle Plantar Flexion  with GTB Seated Plantar Fascia Stretch   Seated Arch Lifts and standing Seated Figure 4 Ankle Inversion with GTB   Clamshell with Resistance  GTB SLS  with UE on back of chair Seated Heel Raise   Alternate Long Sitting Ankle Inversion withGTB   Pt sometimes with difficult with Right ankle on Left  knee Long Sitting Ankle Dorsiflexion with RTB Manual Therapy: STW R plantar fascia R heel pad taping  [facility] Adult PT Treatment:                                                DATE: 01-06-24 Therapeutic Exercise: Seated Ankle Plantar Flexion  with  GTB Seated Plantar Fascia Stretch   Seated Arch Lifts  Seated Figure 4 Ankle Inversion with GTB   Clamshell with Resistance  GTB SLS  at counter Seated Heel Raise   Alternate Long Sitting Ankle Inversion withGTB   Pt sometimes with difficult with Right ankle on Left knee Long Sitting Ankle Dorsiflexion with RTB Updated HEP Manual Therapy: STW R plantar fascia R heel pad taping  Self Care: Timeline of healing.  Progressive overload and adaptability of tissues Shoe wear and heel cup silicone cups for comfort for prolonged standing    EVAL Therapeutic Exercise: Creating, reviewing, and completing below HEP   PATIENT EDUCATION ([place]/[facility]):  POC, diagnosis, prognosis, HEP, and outcome measures.  Pt educated via explanation, demonstration, and handout (HEP).  Pt confirms understanding verbally.   HOME EXERCISE PROGRAM: Access Code: [email or web address] URL: [email or web address] Date: 02/10/2024 Prepared by: Helene Gasmen  Program Notes toe Yoga  Exercises - Seated Plantar Fascia Stretch  - 1 x daily - 7 x weekly - 1 sets - 3 reps - Seated Arch Lifts  - 1 x daily - 7 x weekly - 3 sets - 10 reps - 5 hold - Seated Figure 4 Ankle Inversion with Resistance  - 1 x daily - 7 x weekly - 3 sets - 10 reps - Single Leg Stance  - 1 x daily - 7 x weekly - 1 sets - 4-5 reps - 30 to 60 seconds hold - Heel Raises with Counter Support  - 1 x daily - 7 x weekly - 1 sets - 4-5 reps - 30-45'' hold   Eval 8/21       Progressive loading as tolerated Isometrics 30-45'' x4-5        R LE/hip strength                                 ASSESSMENT:  CLINICAL IMPRESSION: Sarah tolerated session well with no adverse reaction. Pt report continued  improvement in walking and standing tolerance. Continues to be limited by bilateral knee pain.   Continued with eccentric calf strengthening ankle stability. Added in hip strengthening with good tolerance, just fatigue.  Will plan to decrease frequency to 1 x per week and pt will see MD for follow up in October. She may consider a referral for her knee pain.   Eval Margie is a 53 y.o. female who presents to clinic with signs and sxs consistent with R posterior heel pain.  Consistent with physician impression of insertional achilles tendinopathy.   She is using a heel lift and nitro patches.  Tried eccentrics but this was too much load and agging.  Started with band isometrics and will progress as able.   Rmoni will benefit from skilled PT to address relevant deficits and improve comfort in walking and recreational activities.   OBJECTIVE IMPAIRMENTS: Pain, ankle ROM, gait, balance  ACTIVITY LIMITATIONS: walking, standing, housework, stairs  PERSONAL FACTORS: See medical history and pertinent history   REHAB POTENTIAL: Good  CLINICAL DECISION MAKING: Evolving/moderate complexity  EVALUATION COMPLEXITY: Moderate   GOALS:   SHORT TERM GOALS: Target date: 01/30/2024   Perline will be >75% HEP compliant to improve carryover between sessions and facilitate independent management of condition  Evaluation: ongoing Goal status: MET    LONG TERM GOALS: Target date: 02/27/2024   Xochilth will self report >/= 50% decrease in pain from evaluation to improve function in daily tasks  Evaluation/Baseline: 10/10 max  pain 02/12/24: 50% overall improvement Goal status: MET    2.  Azalya will show a >/= 20 pt improvement in LEFS score (MCID is ~11% or 9 pts) as a proxy for functional improvement   Evaluation/Baseline: 33/80 pts 02/22/24: 59/80 Goal status: MET   3.  Richard will be able to complete yard work, including mowing her grass, not limited by pain  Evaluation/Baseline:  limited 02/22/24: havent tried Goal status: INITIAL   4.  Melani will be able to walk for 30 min, not limited by pain   Evaluation/Baseline: 10 min 02/22/24: on feet at mall for 3 hours  Goal status: MET    5.  Valia will be able to perform at least partial ROM SL heel raise on R, not limited by pain  Evaluation/Baseline: unable Goal status: INITIAL    PLAN: PT FREQUENCY: 1-2x/week  PT DURATION: 8 weeks  PLANNED INTERVENTIONS:  [id number]- PT Re-evaluation, 97110-Therapeutic exercises, 97530- Therapeutic activity, [id number]- Neuromuscular re-education, 97535- Self Care, [id number]- Manual therapy, [id number]- Gait training, [id number]- Aquatic Therapy, [id number]- Electrical stimulation (manual), [id number]- Vasopneumatic device, [id number]- Traction (mechanical), [id number]- Ionotophoresis 4mg /ml Dexamethasone , Taping, Dry Needling, Joint manipulation, and Spinal manipulation.   Harlene CHRISTELLA Persons PTA  02/26/24 10:08 AM Phone: [phone] Fax: [phone]

## 2024-02-27 ENCOUNTER — Other Ambulatory Visit (HOSPITAL_COMMUNITY): Payer: Self-pay

## 2024-03-03 ENCOUNTER — Other Ambulatory Visit (HOSPITAL_COMMUNITY): Payer: Self-pay

## 2024-03-04 ENCOUNTER — Ambulatory Visit: Admitting: Physical Therapy

## 2024-03-04 ENCOUNTER — Encounter: Payer: Self-pay | Admitting: Physical Therapy

## 2024-03-04 DIAGNOSIS — M25571 Pain in right ankle and joints of right foot: Secondary | ICD-10-CM

## 2024-03-04 DIAGNOSIS — M25561 Pain in right knee: Secondary | ICD-10-CM | POA: Diagnosis not present

## 2024-03-04 DIAGNOSIS — M25671 Stiffness of right ankle, not elsewhere classified: Secondary | ICD-10-CM

## 2024-03-04 NOTE — Therapy (Addendum)
 OUTPATIENT PHYSICAL THERAPY LOWER EXTREMITY TREATMENT   Patient Name: Mackenzie [name] MRN: [id number] DOB:20-Mar-1971, 53 y.o., female Today's Date: 03/04/2024   PT End of Session - 03/04/24 0849     Visit Number 16    Date for Recertification  04/01/24    Authorization Type [employer]    PT Start Time 0848    PT Stop Time 0926    PT Time Calculation (min) 38 min            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, left (HCC) 01/23/2017   Cellulitis of right leg    Edema, lower extremity    GERD (gastroesophageal reflux disease)    Healthcare maintenance 03/25/2017   Herpesviral infection    Hot flashes 03/2017   Hyperlipidemia 03/20/2016   Impaired fasting glucose    Morbid obesity with body mass index (BMI) of 40.0 to 49.9 (HCC) 09/17/2017   Neoplasm of cervix    Osteoarthritis of knee    Other chest pain    Other specified disorders of veins    Personal history of chemotherapy    Personal history of radiation therapy    Polyneuropathy    Postmenopausal atrophic vaginitis    Pre-diabetes 07/26/2018   Hgb A1C elevated on 07/26/2018, Gestational Diabetes 2012   Pure hypercholesterolemia 03/30/2023   Seasonal allergies 2012   seasonal allergies causes allergic rhinitis and itchy, dry eyes per pt   Stress incontinence    Tachycardia    Thyrotoxicosis    Vitamin D  deficiency 05/2015   Past Surgical History:  Procedure Laterality Date   BREAST RECONSTRUCTION WITH PLACEMENT OF TISSUE EXPANDER AND FLEX HD (ACELLULAR HYDRATED DERMIS) Left 08/27/2017   Procedure: LEFT BREAST RECONSTRUCTION WITH PLACEMENT OF TISSUE EXPANDER AND FLEX HD;  Surgeon: Lowery Estefana RAMAN, DO;  Location: [facility];   Service: Plastics;  Laterality: Left;   CESAREAN SECTION     x2   KNEE ARTHROSCOPY WITH MEDIAL MENISECTOMY Right 11/12/2022   Procedure: RIGHT KNEE ARTHROSCOPY, PARTIAL MEDIAL MENISCECTOMY;  Surgeon: Jerri Kay HERO, MD;  Location: [facility];  Service: Orthopedics;  Laterality: Right;   LATISSIMUS FLAP TO BREAST Left 11/03/2018   LATISSIMUS FLAP TO BREAST Left 11/03/2018   Procedure: LATISSIMUS FLAP TO LEFT BREAST;  Surgeon: Lowery Estefana RAMAN, DO;  Location: [facility];  Service: Plastics;  Laterality: Left;   MASTECTOMY MODIFIED RADICAL Left 08/27/2017   MASTECTOMY MODIFIED RADICAL Left 08/27/2017   Procedure: LEFT MODIFIED RADICAL MASTECTOMY;  Surgeon: Vanderbilt Ned, MD;  Location: [facility];  Service: General;  Laterality: Left;   MASTOPEXY Right 03/03/2019   Procedure: RIGHT BREAST MASTOPEXY/REDUCTION;  Surgeon: Lowery Estefana RAMAN, DO;  Location: [facility];  Service: Plastics;  Laterality: Right;  3 hours, please   PORT-A-CATH REMOVAL Right 08/27/2017   Procedure: REMOVAL PORT-A-CATH RIGHT CHEST;  Surgeon: Vanderbilt Ned, MD;  Location: [facility];  Service: General;  Laterality: Right;   PORTACATH PLACEMENT Right 01/28/2017   Procedure: INSERTION PORT-A-CATH WITH ULTRASOUND;  Surgeon: Vanderbilt Ned, MD;  Location: [facility];  Service: General;  Laterality: Right;   REDUCTION MAMMAPLASTY Right    REMOVAL OF TISSUE EXPANDER  AND PLACEMENT OF IMPLANT Left 03/03/2019   Procedure: REMOVAL OF TISSUE EXPANDER AND PLACEMENT OF EXPANDER;  Surgeon: Lowery Estefana RAMAN, DO;  Location: [facility];  Service: Plastics;  Laterality: Left;   REMOVAL OF TISSUE EXPANDER AND PLACEMENT OF IMPLANT Left 05/25/2019   Procedure: LEFT BREAST REMOVAL OF TISSUE EXPANDER AND PLACEMENT OF IMPLANT;  Surgeon: Lowery Estefana RAMAN, DO;  Location: [facility];  Service: Plastics;  Laterality: Left;   TISSUE EXPANDER PLACEMENT Left 11/03/2018   Procedure:  PLACEMENT OF TISSUE EXPANDER LEFT BREAST;  Surgeon: Lowery Estefana RAMAN, DO;  Location: [facility];  Service: Plastics;  Laterality: Left;  Total case time is 3.5 hours   TUBAL LIGATION Bilateral 01/21/2011   Patient Active Problem List   Diagnosis Date Noted   SOBOE (shortness of breath on exertion) 11/11/2023   Primary osteoarthritis of left knee 10/09/2023   Achilles tendinitis, right leg 10/09/2023   Graves disease 07/16/2023   Multinodular goiter 07/16/2023   Right carpal tunnel syndrome 07/10/2023   Left carpal tunnel syndrome 07/10/2023   Pain of Right Knee 07/06/2023   History of breast reconstruction 05/15/2023   Pure hypercholesterolemia 03/30/2023   Acute meniscal tear, medial, right, initial encounter 11/12/2022   Plica syndrome of right knee 11/12/2022   Chondromalacia, right knee 11/12/2022   Depression 08/13/2022   BMI 40.0-44.9, adult (HCC) 08/13/2022   Obesity, Beginning BMI 08/13/2022   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   Boil of buttock, Left 12/24/2021   Hyperthyroidism 07/22/2021   Low serum thyroid  stimulating hormone (TSH) 06/12/2021   Other fatigue 05/23/2021   Hemoglobin low 05/23/2021   Polyphagia 05/16/2020   Vitamin D  deficiency 05/16/2020   At risk for osteoporosis 05/16/2020   Prediabetes 05/01/2020   Elevated blood pressure reading 05/01/2020   At risk for impaired metabolic function 05/01/2020   Breast asymmetry following reconstructive surgery 09/20/2019   Essential hypertension 08/17/2018   Class 3 severe obesity with serious comorbidity and body mass index (BMI) of 40.0 to 44.9 in adult 09/17/2017   Acquired absence of left breast 09/15/2017   Malignant neoplasm of upper-inner quadrant of left breast in female, 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   Healthcare maintenance 03/25/2017   Malignant 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, estrogen receptor positive (HCC) 01/23/2017   Carcinoma of breast metastatic  to axillary lymph node, left (HCC) 01/23/2017    PCP: [name], MD  REFERRING PROVIDER: Cleatrice Ludie SAUNDERS, MD  THERAPY DIAG:  Stiffness of right ankle, not elsewhere classified  Pain in right ankle and joints of right foot  REFERRING DIAG: Right Achilles tendinitis [M76.61]   Rationale for Evaluation and Treatment:  Rehabilitation  SUBJECTIVE:  PERTINENT PAST HISTORY:  Hx of breast cancer, R knee pain, L knee pain      PRECAUTIONS: None  WEIGHT BEARING RESTRICTIONS No  FALLS:  Has patient fallen in last 6 months? No, Number of falls: 0  MOI/History of condition:  Onset date: January  SUBJECTIVE STATEMENT My right achilles was hurting after last session. Its okay today. Knees are ok today too.    EVAL Mackenzie [name] is a 53 y.o. female who presents to clinic with chief complaint of R posterior ankle pain.  Started in January with no specific inciting event.  Has slowly worsened since that time.  The has used a boot as needed, she is using nitroglycerine patches, she ices.  She has some modest improvement but nothing significant.  Attempted eccentrics which were too painful.     Pain:  Are you having pain? 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  Education administrator: NA  Hand Dominance: NA  Patient Goals/Specific Activities: improved comfort with walking (10 min currently, was walking up to 3 miles/day), yard work   OBJECTIVE:   DIAGNOSTIC FINDINGS:  None relevant  GENERAL OBSERVATION/GAIT:  Antalgic gait with reduced time in stance on R  PALPATION: TTP insertion of R achilles tendon  LE MMT:  MMT Right (Eval) Left (Eval) Right 01/20/24 Right 03/04/24  Hip flexion (L2, L3)      Knee extension (L3) 4 4    Knee flexion 4 4    Hip abduction      Hip  extension      Hip external rotation      Hip internal rotation      Hip adduction      Ankle dorsiflexion (L4) 5 5    Ankle plantarflexion (S1) Partial ROM SL eel raise unable  Unable   Ankle inversion 4 5 5    Ankle eversion 4 5 5    Great Toe ext (L5)      Grossly       (Blank rows = not tested, score listed is out of 5 possible points OR may be listed in lbs of force.  N = WNL, D = diminished, C = clear for gross weakness with myotome testing, * = concordant pain with testing)  LE ROM:  ROM Right (Eval) Left (Eval)  Hip flexion    Hip extension    Hip abduction    Hip adduction    Hip internal rotation    Hip external rotation    Knee extension    Knee flexion    Ankle dorsiflexion 10 10  Ankle plantarflexion 50 50  Ankle inversion 35 35  Ankle eversion 20 20   (Blank rows = not tested, N = WNL, * = concordant pain with testing)  Functional Tests  Eval 01/22/24   Progressive balance screen (highest level completed for >/= 10''):  Feet together: 10'' Semi Tandem: R in rear 10'', L in rear 10'' Tandem: R in rear 4'', L in rear 8'' SLS: R unable, L unable  SLS 28 sec right  SLS 41 sec Right  SLS 60 left                                                        PATIENT SURVEYS:  LEFS: 33/80 LEFS: 59/80  TODAY'S TREATMENT: [facility] Adult PT Treatment:                                                DATE: 03/04/24 Therapeutic Exercise: Isometric heel raise 30 sec x 4  Wooden rocker board x 1 minute Standing gastroc stretch x 30 sec each  Seated Right heel raise 2 inch step and 15# KB added x 30 Standing concentric heel raise to right eccentric lower x 10   Neuromuscular re-ed: SLS trails Tandem on foam  [facility] Adult PT Treatment:                                                DATE: 02/26/24 Therapeutic Exercise: SLR x 10 each  Hip abduction x 10 each  Hamstring stretch active supine Bilat heel raise edge of step 2 x 8 Gastroc stretch at edge of  step x 2 each  Leg press 80# 10 x 2   Neuromuscular re-ed: Tandem on foam RLE back SLS on Foam oval x 2 Right    [facility] Adult PT Treatment:                                                DATE: 02/22/24 Therapeutic Exercise: Nustep L5 LE only x 5 minutes  Unable to SL heel raise on either side  Bilat heel raise edge of step x 10 Bilat Heel raise with 30'' hold from slight deficit x 5 w/ R w/s to periods of SLS( edge of step) Gastroc stretch at edge of step x 2 each  Leg press 60# 10 x 3   Neuromuscular re-ed: Neuromuscular re-ed: R SLS on foam 30 sec bouts with some UE support as needed x 3  A/P  blue rocker board without UE  Modalities: Iontophoresis:  - 1 ml dexamethasone  - 4-6 hour slow release patch - placed R achilles     [facility] Adult PT Treatment:                                                DATE: 02/19/24 Therapeutic Exercise: Bike L5 LE only x 5 minutes  Bilat Heel raise with 30'' hold from slight deficit x 5 w/ R w/s to periods of SLS Knee ext machine 25# bilat 10 x 3 Knee flexion machine 40# bilat 10 x 3  Leg press - 3x10 - 50#  Modalities: Iontophoresis:  - 1 ml dexamethasone  - 4-6 hour slow release patch - placed R achilles  Neuromuscular re-ed: R SLS on foam 30 sec bouts with some UE support as needed x 3    [facility] Adult PT Treatment:                                                DATE: 02/12/24 Therapeutic Exercise: Nustep L6 min 5 min Bilat Heel raise with 30'' hold from slight deficit x 4 Bilat Soleus heel raise 30 hold on Leg press using 40#  x 4  Slant board stretch for gastroc and soleus 60 sec  Knee ext machine 10# bilat con, RLE ecc x 10 Knee flexion machine 20# bilat con, RLE ecc x 10 Modalities: Iontophoresis:  - 1 ml dexamethasone  - 4-6 hour slow release patch - placed R achilles     [facility] Adult PT Treatment:  DATE: 02/10/24  Therapeutic Exercise: Nustep L6 min 5 min Heel raise with 30''  hold from slight deficit x 4 Gastroc slant board stretch 60s Knee ext machine - 2x10 - 10# Knee flexion machine - 2x10 - 20#  Therapeutic Activity: Runners step 6in step x10 w/ UE support R knee pain   Iontophoresis:  - 1 ml dexamethasone  - 4-6 hour slow release patch - placed R achilles   [facility] Adult PT Treatment:                                                DATE: 02/01/24 Therapeutic Exercise: Seated heel raises Step stretch for gastroc 10 sec x 3  Bilat heel raise 30 sec x 3  Soleus heel raise 22 sec , 25 sec  Leg press 40# SL 10 x 3  Seated heel raise from 2 inch step 15# 15 sec x 3 R Wooden rocker A/P rocking Slant board stretch Self Care- concurrent with modalities Consider ionto  Resume more frequent ice applications   Modalities: Ice pack right heel x 8 minutes      [facility] Adult PT Treatment:                                                DATE: 01/28/24 Therapeutic Exercise: Towel scrunches - 1' x2 30'' heel raise isometrics - x4 KT tape for achilles  Therapeutic Activity  Cybex leg press - 40# bil x10, SL - 3x10 Standing hip abd machine - 25# - 15x ea Pt education regarding tendon response to exercise and goals of isometrics/tendon loading  Neuromuscular re-ed: Tandem on foam    [facility] Adult PT Treatment:                                                DATE: 01/22/24 Therapeutic Exercise: Seated toe scrunches and toe yoga Seated BTB PF x 20 Seated Inv GTB x 20 Seated gastroc and soleus stretch with towel  Seated heel raise on 2 inch step, 15# KB ,  5 sec x 10, 10 sec x 10  SLS 28 sec , min increased pain Right heel pad taping.          PATIENT EDUCATION ([place]/[facility]):  POC, diagnosis, prognosis, HEP, and outcome measures.  Pt educated via explanation, demonstration, and handout (HEP).  Pt confirms understanding verbally.   HOME EXERCISE PROGRAM: Access Code: [email or web address] URL: [email or web address] Date: 02/10/2024 Prepared by: Helene Gasmen  Program Notes toe Yoga  Exercises - Seated Plantar Fascia Stretch  - 1 x daily - 7 x weekly - 1 sets - 3 reps - Seated Arch Lifts  - 1 x daily - 7 x weekly - 3 sets - 10 reps - 5 hold - Seated Figure 4 Ankle Inversion with Resistance  - 1 x daily - 7 x weekly - 3 sets - 10 reps - Single Leg Stance  - 1 x daily - 7 x weekly - 1 sets - 4-5 reps - 30 to 60 seconds hold - Heel Raises with Counter Support  - 1 x daily - 7 x  weekly - 1 sets - 4-5 reps - 30-45'' hold   Eval 8/21       Progressive loading as tolerated Isometrics 30-45'' x4-5        R LE/hip strength                                 ASSESSMENT:  CLINICAL IMPRESSION: Mackenzie [name] tolerated session well with no adverse reaction. Pt reports increased pain after last session that lasted the rest of the day. She met most LTGS except Plantarflexion strength goal. Still unable to perform a partial heel raise on right. She does endorse 50% improvement in pain and is able to be active on her feet longer. Will plan to decrease frequency to 1 x per week and pt will see MD for follow up in October.   Eval Mackenzie [name] is a 53 y.o. female who presents to clinic with signs and sxs consistent with R posterior heel pain.  Consistent with physician impression of insertional achilles tendinopathy.   She is using a heel lift and nitro patches.  Tried eccentrics but this was too much load and agging.  Started with band isometrics and will progress as able.   Mackenzie [name] will benefit from skilled PT to address relevant deficits and improve comfort in walking and recreational activities.   OBJECTIVE IMPAIRMENTS: Pain, ankle ROM, gait, balance  ACTIVITY LIMITATIONS: walking, standing, housework, stairs  PERSONAL FACTORS: See medical history and pertinent history   REHAB POTENTIAL: Good  CLINICAL DECISION MAKING: Evolving/moderate complexity  EVALUATION COMPLEXITY: Moderate   GOALS:   SHORT TERM GOALS: Target date: 01/30/2024   Mackenzie [name]  will be >75% HEP compliant to improve carryover between sessions and facilitate independent management of condition  Evaluation: ongoing Goal status: MET    LONG TERM GOALS: Target date: 02/27/2024 extended to 04/01/2024    Mackenzie [name] will self report >/= 50% decrease in pain from evaluation to improve function in daily tasks  Evaluation/Baseline: 10/10 max pain 02/12/24: 50% overall improvement Goal status: MET    2.  Mackenzie [name] will show a >/= 20 pt improvement in LEFS score (MCID is ~11% or 9 pts) as a proxy for functional improvement   Evaluation/Baseline: 33/80 pts 02/22/24: 59/80 Goal status: MET   3.  Mackenzie [name] will be able to complete yard work, including mowing her grass, not limited by pain  Evaluation/Baseline: limited 02/22/24: havent tried 03/04/24: did yard work Saturday, did not increase pain Goal status: ONGOING   4.  Mackenzie [name] will be able to walk for 30 min, not limited by pain   Evaluation/Baseline: 10 min 02/22/24: on feet at mall for 3 hours  Goal status: MET    5.  Mackenzie [name] will be able to perform at least partial ROM SL heel raise on R, not limited by pain  Evaluation/Baseline: unable 03/04/24: unable  Goal status: ONGOING    PLAN: PT FREQUENCY: 1-2x/week  PT DURATION: 8 weeks  PLANNED INTERVENTIONS:  [id number]- PT Re-evaluation, 97110-Therapeutic exercises, 97530- Therapeutic activity, 97112- Neuromuscular re-education, 97535- Self Care, [id number]- Manual therapy, [id number]- Gait training, [id number]- Aquatic Therapy, [id number]- Electrical stimulation (manual), [id number]- Vasopneumatic device, [id number]- Traction (mechanical), [id number]- Ionotophoresis 4mg /ml Dexamethasone , Taping, Dry Needling, Joint manipulation, and Spinal manipulation.   Harlene Persons, PTA 03/04/24 9:57 AM Phone: [phone] Fax: [phone]   Helene Gasmen PT, DPT 03/04/24 10:51 AM

## 2024-03-04 NOTE — Addendum Note (Signed)
 Addended by: DOMINIC HELENE BRAVO on: 03/04/2024 10:53 AM   Modules accepted: Orders

## 2024-03-08 ENCOUNTER — Other Ambulatory Visit (HOSPITAL_COMMUNITY): Payer: Self-pay

## 2024-03-08 ENCOUNTER — Encounter (INDEPENDENT_AMBULATORY_CARE_PROVIDER_SITE_OTHER): Payer: Self-pay | Admitting: Adult Health

## 2024-03-08 ENCOUNTER — Ambulatory Visit (INDEPENDENT_AMBULATORY_CARE_PROVIDER_SITE_OTHER): Admitting: Adult Health

## 2024-03-08 VITALS — BP 125/74 | HR 78 | Temp 98.2°F | Ht 65.0 in | Wt 271.0 lb

## 2024-03-08 DIAGNOSIS — R7303 Prediabetes: Secondary | ICD-10-CM | POA: Diagnosis not present

## 2024-03-08 DIAGNOSIS — F5089 Other specified eating disorder: Secondary | ICD-10-CM | POA: Diagnosis not present

## 2024-03-08 DIAGNOSIS — K760 Fatty (change of) liver, not elsewhere classified: Secondary | ICD-10-CM

## 2024-03-08 DIAGNOSIS — F3289 Other specified depressive episodes: Secondary | ICD-10-CM

## 2024-03-08 DIAGNOSIS — Z6841 Body Mass Index (BMI) 40.0 and over, adult: Secondary | ICD-10-CM

## 2024-03-08 MED ORDER — BUPROPION HCL ER (SR) 200 MG PO TB12
200.0000 mg | ORAL_TABLET | Freq: Every day | ORAL | 0 refills | Status: DC
  Filled 2024-03-08 – 2024-04-13 (×2): qty 90, 90d supply, fill #0

## 2024-03-08 NOTE — Progress Notes (Signed)
 WEIGHT SUMMARY AND BIOMETRICS  Vitals Temp: 98.2 F (36.8 C) BP: 125/74 Pulse Rate: 78 SpO2: 99 %   Anthropometric Measurements Height: 5' 5 (1.651 m) Weight: 271 lb (122.9 kg) BMI (Calculated): 45.1 Weight at Last Visit: 268 lb Weight Lost Since Last Visit: 0 Weight Gained Since Last Visit: 3 lb Starting Weight: 267 lb Total Weight Loss (lbs): 0 lb (0 kg) Peak Weight: 320 lb   Body Composition  Body Fat %: 51.3 % Fat Mass (lbs): 139 lbs Muscle Mass (lbs): 125.4 lbs Total Body Water (lbs): 92 lbs Visceral Fat Rating : 17   Other Clinical Data Fasting: yes Labs: no Today's Visit #: [id number] Starting Date: 08/09/19    Chief Complaint:   OBESITY Mackenzie [name] is here to discuss her progress with her obesity treatment plan.  She is on the the Category 3 Plan and states she is following her eating plan approximately 70 % of the time.  She states she is exercising Water Aerobics, Strengthening, Walking 45-60/20/30-40 minutes 1/1/4 times per week.  Interim History:  [name] provided the following food recall that is typical of a day: Smoothie: protein powder, fair life milk May add in [place] sausage or chicken strips Lunch: 8-10 oz meat protein with 2 cups of vegetables;es Dinner: meat sandwich or protein shake Snacks: grapes, almonds, crackers  She is frustrated with lack of weight loss despite consisently eating on plan and increased regular exercise (Cardio and ST).  She is on Metformin  XR 500mg  BID and Wellbutrin  SR 200mg   She was previously on GLP-1 thearpy and tolerated well. Her insurance will not cover injectable AOMs. Discussed utilizing [employer] Option for Zepbound Vials  Subjective:   1. Hepatic steatosis 12/25/2023 CLINICAL DATA:  Right upper quadrant pain and elevated liver enzymes. EXAM: ULTRASOUND ABDOMEN LIMITED RIGHT UPPER QUADRANT COMPARISON:  April 04, 2019 FINDINGS: Gallbladder: A gallstone filled gallbladder is seen. This is  present on the prior study. There is no evidence of gallbladder wall thickening (2.8 mm). No sonographic Murphy sign noted by sonographer. Common bile duct: Diameter: 2.9 mm Liver: No focal lesion identified. Diffusely increased echogenicity of the liver parenchyma is noted. Portal vein is patent on color Doppler imaging with normal direction of blood flow towards the liver. Other: None.  IMPRESSION: 1. Cholelithiasis without evidence of acute cholecystitis. 2. Hepatic steatosis.  2. Pre-diabetes Lab Results  Component Value Date   HGBA1C 5.9 (H) 09/15/2023   HGBA1C 6.1 (H) 06/08/2023   HGBA1C 6.0 (H) 01/19/2023     Latest Reference Range & Units 01/19/23 09:39 06/08/23 10:36 09/15/23 09:16  INSULIN  2.6 - 24.9 uIU/mL 15.4 24.1 14.7   She is on Metformin  XR 500mg  BID She was previously on GLP-1 thearpy and tolerated well. Her insurance will not cover injectable AOMs. Discussed utilizing [employer] Option for Zepbound Vials  3. Emotional Eating Behavior BP at goal She denies hx of seizures She is frustrated with lack of weight loss despite consisently eating on plan and increased regular exercise (Cardio and ST). She is on Metformin  XR 500mg  BID and Wellbutrin  SR 200mg   Assessment/Plan:   1. Hepatic steatosis (Primary) Continue with weight loss efforts Avoid Hepatoxic substances  2. Pre-diabetes Continue healthy eating and regular cardiovascular exercise  3. Emotional Eating Behavior Refill  buPROPion  (WELLBUTRIN  SR) 200 MG 12 hr tablet Take 1 tablet (200 mg total) by mouth daily. Dispense: 90 tablet, Refills: 0 of 0 remaining   4. Obesity, Current BMI 51.1 Consider  utilizing [employer] Option for Zepbound Vials  [name] is currently in the action stage of change. As such, her goal is to continue with weight loss efforts. She has agreed to the Category 3 Plan.   Exercise goals: All adults should avoid inactivity. Some physical activity is better than none,  and adults who participate in any amount of physical activity gain some health benefits. Adults should also include muscle-strengthening activities that involve all major muscle groups on 2 or more days a week.  Behavioral modification strategies: increasing lean protein intake, decreasing simple carbohydrates, increasing vegetables, increasing water intake, no skipping meals, meal planning and cooking strategies, keeping healthy foods in the home, ways to avoid boredom eating, and planning for success.  Darnella has agreed to follow-up with our clinic in 4 weeks. She was informed of the importance of frequent follow-up visits to maximize her success with intensive lifestyle modifications for her multiple health conditions.   Objective:   Blood pressure 125/74, pulse 78, temperature 98.2 F (36.8 C), height 5' 5 (1.651 m), weight 271 lb (122.9 kg), last menstrual period 11/28/2016, SpO2 99%, unknown if currently breastfeeding. Body mass index is 45.1 kg/m.  General: Cooperative, alert, well developed, in no acute distress. HEENT: Conjunctivae and lids unremarkable. Cardiovascular: Regular rhythm.  Lungs: Normal work of breathing. Neurologic: No focal deficits.   Lab Results  Component Value Date   CREATININE 0.89 12/01/2023   BUN 21 (H) 12/01/2023   NA 135 12/01/2023   K 4.2 12/01/2023   CL 103 12/01/2023   CO2 25 12/01/2023   Lab Results  Component Value Date   ALT 22 12/01/2023   AST 16 12/01/2023   GGT 49 06/09/2023   ALKPHOS 118 12/01/2023   BILITOT 0.4 12/01/2023   Lab Results  Component Value Date   HGBA1C 5.9 (H) 09/15/2023   HGBA1C 6.1 (H) 06/08/2023   HGBA1C 6.0 (H) 01/19/2023   HGBA1C 5.8 (H) 09/03/2022   HGBA1C 5.4 01/29/2022   Lab Results  Component Value Date   INSULIN  14.7 09/15/2023   INSULIN  24.1 06/08/2023   INSULIN  15.4 01/19/2023   INSULIN  13.1 09/03/2022   INSULIN  21.3 01/29/2022   Lab Results  Component Value Date   TSH 1.73 01/13/2024    Lab Results  Component Value Date   CHOL 197 06/08/2023   HDL 56 06/08/2023   LDLCALC 124 (H) 06/08/2023   TRIG 93 06/08/2023   CHOLHDL 3.0 03/31/2023   Lab Results  Component Value Date   VD25OH 75.1 09/15/2023   VD25OH 65.8 06/08/2023   VD25OH 76.5 01/19/2023   Lab Results  Component Value Date   WBC 5.7 12/01/2023   HGB 12.6 12/01/2023   HCT 38.0 12/01/2023   MCV 82.4 12/01/2023   PLT 225 12/01/2023   No results found for: IRON, TIBC, FERRITIN  Attestation Statements:   Reviewed by clinician on day of visit: allergies, medications, problem list, medical history, surgical history, family history, social history, and previous encounter notes.  I have reviewed the above documentation for accuracy and completeness, and I agree with the above. -  Maximilian Tallo d. Clayburn Weekly, NP-C

## 2024-03-11 ENCOUNTER — Ambulatory Visit: Attending: Family Medicine | Admitting: Physical Therapy

## 2024-03-11 ENCOUNTER — Encounter: Payer: Self-pay | Admitting: Physical Therapy

## 2024-03-11 DIAGNOSIS — M25562 Pain in left knee: Secondary | ICD-10-CM | POA: Insufficient documentation

## 2024-03-11 DIAGNOSIS — M25571 Pain in right ankle and joints of right foot: Secondary | ICD-10-CM | POA: Diagnosis not present

## 2024-03-11 DIAGNOSIS — M25561 Pain in right knee: Secondary | ICD-10-CM | POA: Insufficient documentation

## 2024-03-11 DIAGNOSIS — G8929 Other chronic pain: Secondary | ICD-10-CM | POA: Diagnosis not present

## 2024-03-11 DIAGNOSIS — M25671 Stiffness of right ankle, not elsewhere classified: Secondary | ICD-10-CM | POA: Diagnosis not present

## 2024-03-11 NOTE — Therapy (Signed)
 OUTPATIENT PHYSICAL THERAPY LOWER EXTREMITY TREATMENT   Patient Name: Mackenzie [name] MRN: [id number] DOB:May 17, 1971, 53 y.o., female Today's Date: 03/11/2024   PT End of Session - 03/11/24 0848     Visit Number 17    Date for Recertification  04/01/24    Authorization Type [employer]    PT Start Time 0845    PT Stop Time 0923    PT Time Calculation (min) 38 min            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, left (HCC) 01/23/2017   Cellulitis of right leg    Edema, lower extremity    GERD (gastroesophageal reflux disease)    Healthcare maintenance 03/25/2017   Herpesviral infection    Hot flashes 03/2017   Hyperlipidemia 03/20/2016   Impaired fasting glucose    Morbid obesity with body mass index (BMI) of 40.0 to 49.9 (HCC) 09/17/2017   Neoplasm of cervix    Osteoarthritis of knee    Other chest pain    Other specified disorders of veins    Personal history of chemotherapy    Personal history of radiation therapy    Polyneuropathy    Postmenopausal atrophic vaginitis    Pre-diabetes 07/26/2018   Hgb A1C elevated on 07/26/2018, Gestational Diabetes 2012   Pure hypercholesterolemia 03/30/2023   Seasonal allergies 2012   seasonal allergies causes allergic rhinitis and itchy, dry eyes per pt   Stress incontinence    Tachycardia    Thyrotoxicosis    Vitamin D  deficiency 05/2015   Past Surgical History:  Procedure Laterality Date   BREAST RECONSTRUCTION WITH PLACEMENT OF TISSUE EXPANDER AND FLEX HD (ACELLULAR HYDRATED DERMIS) Left 08/27/2017   Procedure: LEFT BREAST RECONSTRUCTION WITH PLACEMENT OF TISSUE EXPANDER AND FLEX HD;  Surgeon: Lowery Estefana RAMAN, DO;  Location: [facility];   Service: Plastics;  Laterality: Left;   CESAREAN SECTION     x2   KNEE ARTHROSCOPY WITH MEDIAL MENISECTOMY Right 11/12/2022   Procedure: RIGHT KNEE ARTHROSCOPY, PARTIAL MEDIAL MENISCECTOMY;  Surgeon: Jerri Kay HERO, MD;  Location: [facility];  Service: Orthopedics;  Laterality: Right;   LATISSIMUS FLAP TO BREAST Left 11/03/2018   LATISSIMUS FLAP TO BREAST Left 11/03/2018   Procedure: LATISSIMUS FLAP TO LEFT BREAST;  Surgeon: Lowery Estefana RAMAN, DO;  Location: [facility];  Service: Plastics;  Laterality: Left;   MASTECTOMY MODIFIED RADICAL Left 08/27/2017   MASTECTOMY MODIFIED RADICAL Left 08/27/2017   Procedure: LEFT MODIFIED RADICAL MASTECTOMY;  Surgeon: Vanderbilt Ned, MD;  Location: [facility];  Service: General;  Laterality: Left;   MASTOPEXY Right 03/03/2019   Procedure: RIGHT BREAST MASTOPEXY/REDUCTION;  Surgeon: Lowery Estefana RAMAN, DO;  Location: [facility];  Service: Plastics;  Laterality: Right;  3 hours, please   PORT-A-CATH REMOVAL Right 08/27/2017   Procedure: REMOVAL PORT-A-CATH RIGHT CHEST;  Surgeon: Vanderbilt Ned, MD;  Location: [facility];  Service: General;  Laterality: Right;   PORTACATH PLACEMENT Right 01/28/2017   Procedure: INSERTION PORT-A-CATH WITH ULTRASOUND;  Surgeon: Vanderbilt Ned, MD;  Location: [facility];  Service: General;  Laterality: Right;   REDUCTION MAMMAPLASTY Right    REMOVAL OF TISSUE EXPANDER  AND PLACEMENT OF IMPLANT Left 03/03/2019   Procedure: REMOVAL OF TISSUE EXPANDER AND PLACEMENT OF EXPANDER;  Surgeon: Lowery Estefana RAMAN, DO;  Location: [facility];  Service: Plastics;  Laterality: Left;   REMOVAL OF TISSUE EXPANDER AND PLACEMENT OF IMPLANT Left 05/25/2019   Procedure: LEFT BREAST REMOVAL OF TISSUE EXPANDER AND PLACEMENT OF IMPLANT;  Surgeon: Lowery Estefana RAMAN, DO;  Location: [facility];  Service: Plastics;  Laterality: Left;   TISSUE EXPANDER PLACEMENT Left 11/03/2018   Procedure:  PLACEMENT OF TISSUE EXPANDER LEFT BREAST;  Surgeon: Lowery Estefana RAMAN, DO;  Location: [facility];  Service: Plastics;  Laterality: Left;  Total case time is 3.5 hours   TUBAL LIGATION Bilateral 01/21/2011   Patient Active Problem List   Diagnosis Date Noted   SOBOE (shortness of breath on exertion) 11/11/2023   Primary osteoarthritis of left knee 10/09/2023   Achilles tendinitis, right leg 10/09/2023   [place]ves disease 07/16/2023   Multinodular goiter 07/16/2023   Right carpal tunnel syndrome 07/10/2023   Left carpal tunnel syndrome 07/10/2023   Pain of Right Knee 07/06/2023   History of breast reconstruction 05/15/2023   Pure hypercholesterolemia 03/30/2023   Acute meniscal tear, medial, right, initial encounter 11/12/2022   Plica syndrome of right knee 11/12/2022   Chondromalacia, right knee 11/12/2022   Depression 08/13/2022   BMI 40.0-44.9, adult (HCC) 08/13/2022   Obesity, Beginning BMI 08/13/2022   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   Boil of buttock, Left 12/24/2021   Hyperthyroidism 07/22/2021   Low serum thyroid  stimulating hormone (TSH) 06/12/2021   Other fatigue 05/23/2021   Hemoglobin low 05/23/2021   Polyphagia 05/16/2020   Vitamin D  deficiency 05/16/2020   At risk for osteoporosis 05/16/2020   Prediabetes 05/01/2020   Elevated blood pressure reading 05/01/2020   At risk for impaired metabolic function 05/01/2020   Breast asymmetry following reconstructive surgery 09/20/2019   Essential hypertension 08/17/2018   Class 3 severe obesity with serious comorbidity and body mass index (BMI) of 40.0 to 44.9 in adult [facility]) 09/17/2017   Acquired absence of left breast 09/15/2017   Malignant neoplasm of upper-inner quadrant of left breast in female, 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   Healthcare maintenance 03/25/2017   Malignant 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, estrogen receptor positive (HCC) 01/23/2017   Carcinoma of breast  metastatic to axillary lymph node, left (HCC) 01/23/2017    PCP: [name], MD  REFERRING PROVIDER: Cleatrice Ludie SAUNDERS, MD  THERAPY DIAG:  Stiffness of right ankle, not elsewhere classified  Pain in right ankle and joints of right foot  Chronic pain of right knee  REFERRING DIAG: Right Achilles tendinitis [M76.61]   Rationale for Evaluation and Treatment:  Rehabilitation  SUBJECTIVE:  PERTINENT PAST HISTORY:  Hx of breast cancer, R knee pain, L knee pain      PRECAUTIONS: None  WEIGHT BEARING RESTRICTIONS No  FALLS:  Has patient fallen in last 6 months? No, Number of falls: 0  MOI/History of condition:  Onset date: January  SUBJECTIVE STATEMENT Achilles has been doing good. Walked .82 miles this morning. No achilles pain. Will see MD prior to last PT appt.    EVAL Brittlyn C Wojtaszek is a 53 y.o. female who presents to clinic with chief complaint of R posterior ankle pain.  Started in January with no specific inciting event.  Has slowly worsened since that time.  The has used a boot as needed, she is using nitroglycerine patches,  she ices.  She has some modest improvement but nothing significant.  Attempted eccentrics which were too painful.     Pain:  Are you having pain? 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  Education administrator: NA  Hand Dominance: NA  Patient Goals/Specific Activities: improved comfort with walking (10 min currently, was walking up to 3 miles/day), yard work   OBJECTIVE:   DIAGNOSTIC FINDINGS:  None relevant  GENERAL OBSERVATION/GAIT:  Antalgic gait with reduced time in stance on R  PALPATION: TTP insertion of R achilles tendon  LE MMT:  MMT Right (Eval) Left (Eval) Right 01/20/24 Right 03/04/24  Hip flexion (L2, L3)      Knee  extension (L3) 4 4    Knee flexion 4 4    Hip abduction      Hip extension      Hip external rotation      Hip internal rotation      Hip adduction      Ankle dorsiflexion (L4) 5 5    Ankle plantarflexion (S1) Partial ROM SL eel raise unable  Unable   Ankle inversion 4 5 5    Ankle eversion 4 5 5    Great Toe ext (L5)      Grossly       (Blank rows = not tested, score listed is out of 5 possible points OR may be listed in lbs of force.  N = WNL, D = diminished, C = clear for gross weakness with myotome testing, * = concordant pain with testing)  LE ROM:  ROM Right (Eval) Left (Eval)  Hip flexion    Hip extension    Hip abduction    Hip adduction    Hip internal rotation    Hip external rotation    Knee extension    Knee flexion    Ankle dorsiflexion 10 10  Ankle plantarflexion 50 50  Ankle inversion 35 35  Ankle eversion 20 20   (Blank rows = not tested, N = WNL, * = concordant pain with testing)  Functional Tests  Eval 01/22/24 9/26  Progressive balance screen (highest level completed for >/= 10''):  Feet together: 10'' Semi Tandem: R in rear 10'', L in rear 10'' Tandem: R in rear 4'', L in rear 8'' SLS: R unable, L unable  SLS 28 sec right  SLS 41 sec Right  SLS 60 left            PATIENT SURVEYS:  LEFS: 33/80 LEFS: 59/80  TODAY'S TREATMENT: [facility] Adult PT Treatment:                                                DATE: 03/11/24 Therapeutic Exercise: Isometric heel raise 30 sec x 4  Wooden rocker board x 1 minute Slant board stretch x 2  Standing concentric heel raise to right eccentric lower x 10 each on 2 inch step Seated Right heel raise 2 inch step and 15# KB added x 30 Seated Soleus stretch with towel x2   Neuromuscular re-ed: SLS trails on foam oval 38 sec  Tandem on foam > 60 sec each   [facility] Adult PT Treatment:  DATE: 03/04/24 Therapeutic Exercise: Isometric heel raise 30 sec x 4  Wooden rocker  board x 1 minute Standing gastroc stretch x 30 sec each  Seated Right heel raise 2 inch step and 15# KB added x 30 Standing concentric heel raise to right eccentric lower x 10   Neuromuscular re-ed: SLS trails Tandem on foam     [facility] Adult PT Treatment:                                                DATE: 02/26/24 Therapeutic Exercise: SLR x 10 each  Hip abduction x 10 each  Hamstring stretch active supine Bilat heel raise edge of step 2 x 8 Gastroc stretch at edge of step x 2 each  Leg press 80# 10 x 2   Neuromuscular re-ed: Tandem on foam RLE back SLS on Foam oval x 2 Right    [facility] Adult PT Treatment:                                                DATE: 02/22/24 Therapeutic Exercise: Nustep L5 LE only x 5 minutes  Unable to SL heel raise on either side  Bilat heel raise edge of step x 10 Bilat Heel raise with 30'' hold from slight deficit x 5 w/ R w/s to periods of SLS( edge of step) Gastroc stretch at edge of step x 2 each  Leg press 60# 10 x 3   Neuromuscular re-ed: Neuromuscular re-ed: R SLS on foam 30 sec bouts with some UE support as needed x 3  A/P  blue rocker board without UE  Modalities: Iontophoresis:  - 1 ml dexamethasone  - 4-6 hour slow release patch - placed R achilles     [facility] Adult PT Treatment:                                                DATE: 02/19/24 Therapeutic Exercise: Bike L5 LE only x 5 minutes  Bilat Heel raise with 30'' hold from slight deficit x 5 w/ R w/s to periods of SLS Knee ext machine 25# bilat 10 x 3 Knee flexion machine 40# bilat 10 x 3  Leg press - 3x10 - 50#  Modalities: Iontophoresis:  - 1 ml dexamethasone  - 4-6 hour slow release patch - placed R achilles  Neuromuscular re-ed: R SLS on foam 30 sec bouts with some UE support as needed x 3    [facility] Adult PT Treatment:                                                DATE: 02/12/24 Therapeutic Exercise: Nustep L6 min 5 min Bilat Heel raise with 30'' hold from slight  deficit x 4 Bilat Soleus heel raise 30 hold on Leg press using 40#  x 4  Slant board stretch for gastroc and soleus 60 sec  Knee ext machine 10# bilat con, RLE ecc x 10 Knee flexion machine 20# bilat  con, RLE ecc x 10 Modalities: Iontophoresis:  - 1 ml dexamethasone  - 4-6 hour slow release patch - placed R achilles     [facility] Adult PT Treatment:                                                DATE: 02/10/24  Therapeutic Exercise: Nustep L6 min 5 min Heel raise with 30'' hold from slight deficit x 4 Gastroc slant board stretch 60s Knee ext machine - 2x10 - 10# Knee flexion machine - 2x10 - 20#  Therapeutic Activity: Runners step 6in step x10 w/ UE support R knee pain   Iontophoresis:  - 1 ml dexamethasone  - 4-6 hour slow release patch - placed R achilles   [facility] Adult PT Treatment:                                                DATE: 02/01/24 Therapeutic Exercise: Seated heel raises Step stretch for gastroc 10 sec x 3  Bilat heel raise 30 sec x 3  Soleus heel raise 22 sec , 25 sec  Leg press 40# SL 10 x 3  Seated heel raise from 2 inch step 15# 15 sec x 3 R Wooden rocker A/P rocking Slant board stretch Self Care- concurrent with modalities Consider ionto  Resume more frequent ice applications   Modalities: Ice pack right heel x 8 minutes      [facility] Adult PT Treatment:                                                DATE: 01/28/24 Therapeutic Exercise: Towel scrunches - 1' x2 30'' heel raise isometrics - x4 KT tape for achilles  Therapeutic Activity  Cybex leg press - 40# bil x10, SL - 3x10 Standing hip abd machine - 25# - 15x ea Pt education regarding tendon response to exercise and goals of isometrics/tendon loading  Neuromuscular re-ed: Tandem on foam    [facility] Adult PT Treatment:                                                DATE: 01/22/24 Therapeutic Exercise: Seated toe scrunches and toe yoga Seated BTB PF x 20 Seated Inv GTB x 20 Seated gastroc and  soleus stretch with towel  Seated heel raise on 2 inch step, 15# KB ,  5 sec x 10, 10 sec x 10  SLS 28 sec , min increased pain Right heel pad taping.          PATIENT EDUCATION ([place]/[facility]):  POC, diagnosis, prognosis, HEP, and outcome measures.  Pt educated via explanation, demonstration, and handout (HEP).  Pt confirms understanding verbally.   HOME EXERCISE PROGRAM: Access Code: [email or web address] URL: [email or web address] Date: 02/10/2024 Prepared by: Helene Gasmen  Program Notes toe Yoga  Exercises - Seated Plantar Fascia Stretch  - 1 x daily - 7 x weekly - 1 sets - 3 reps - Seated Arch Lifts  - 1 x  daily - 7 x weekly - 3 sets - 10 reps - 5 hold - Seated Figure 4 Ankle Inversion with Resistance  - 1 x daily - 7 x weekly - 3 sets - 10 reps - Single Leg Stance  - 1 x daily - 7 x weekly - 1 sets - 4-5 reps - 30 to 60 seconds hold - Heel Raises with Counter Support  - 1 x daily - 7 x weekly - 1 sets - 4-5 reps - 30-45'' hold   Eval 8/21       Progressive loading as tolerated Isometrics 30-45'' x4-5        R LE/hip strength                                 ASSESSMENT:  CLINICAL IMPRESSION: Deneshia tolerated session well with no adverse reaction. Pt reports no achilles pain since last visit. Continues to be limited by knee pain. Pt will see MD for follow up next week and will return to PT for final visit. Most goals met. Focusing on eccentric calf strength to reach remaining LTGs.   Eval Samanta is a 53 y.o. female who presents to clinic with signs and sxs consistent with R posterior heel pain.  Consistent with physician impression of insertional achilles tendinopathy.   She is using a heel lift and nitro patches.  Tried eccentrics but this was too much load and agging.  Started with band isometrics and will progress as able.   Mylissa will benefit from skilled PT to address relevant deficits and improve comfort in walking and recreational activities.   OBJECTIVE  IMPAIRMENTS: Pain, ankle ROM, gait, balance  ACTIVITY LIMITATIONS: walking, standing, housework, stairs  PERSONAL FACTORS: See medical history and pertinent history   REHAB POTENTIAL: Good  CLINICAL DECISION MAKING: Evolving/moderate complexity  EVALUATION COMPLEXITY: Moderate   GOALS:   SHORT TERM GOALS: Target date: 01/30/2024   Larissa will be >75% HEP compliant to improve carryover between sessions and facilitate independent management of condition  Evaluation: ongoing Goal status: MET    LONG TERM GOALS: Target date: 02/27/2024 extended to 04/01/2024    Ophelia will self report >/= 50% decrease in pain from evaluation to improve function in daily tasks  Evaluation/Baseline: 10/10 max pain 02/12/24: 50% overall improvement Goal status: MET    2.  Mardee will show a >/= 20 pt improvement in LEFS score (MCID is ~11% or 9 pts) as a proxy for functional improvement   Evaluation/Baseline: 33/80 pts 02/22/24: 59/80 Goal status: MET   3.  Kalii will be able to complete yard work, including mowing her grass, not limited by pain  Evaluation/Baseline: limited 02/22/24: havent tried 03/04/24: did yard work Saturday, did not increase pain Goal status: ONGOING   4.  Shunda will be able to walk for 30 min, not limited by pain   Evaluation/Baseline: 10 min 02/22/24: on feet at mall for 3 hours  Goal status: MET    5.  Gabryel will be able to perform at least partial ROM SL heel raise on R, not limited by pain  Evaluation/Baseline: unable 03/04/24: unable  Goal status: ONGOING    PLAN: PT FREQUENCY: 1-2x/week  PT DURATION: 8 weeks  PLANNED INTERVENTIONS:  [id number]- PT Re-evaluation, 97110-Therapeutic exercises, 97530- Therapeutic activity, [id number]- Neuromuscular re-education, 97535- Self Care, [id number]- Manual therapy, [id number]- Gait training, [id number]- Aquatic Therapy, [id number]- Electrical stimulation (manual), [id number]- Vasopneumatic device, [id number]- Traction (mechanical), [id number]-  Ionotophoresis 4mg /ml Dexamethasone , Taping, Dry Needling, Joint manipulation, and Spinal manipulation.   Harlene Persons, PTA 03/11/24 9:19 AM Phone: [phone] Fax: [phone]

## 2024-03-15 ENCOUNTER — Other Ambulatory Visit (HOSPITAL_COMMUNITY): Payer: Self-pay

## 2024-03-16 ENCOUNTER — Ambulatory Visit (INDEPENDENT_AMBULATORY_CARE_PROVIDER_SITE_OTHER): Admitting: Family Medicine

## 2024-03-16 ENCOUNTER — Encounter: Payer: Self-pay | Admitting: Family Medicine

## 2024-03-16 VITALS — BP 146/81 | Ht 65.0 in | Wt 271.0 lb

## 2024-03-16 DIAGNOSIS — M94261 Chondromalacia, right knee: Secondary | ICD-10-CM

## 2024-03-16 DIAGNOSIS — M1712 Unilateral primary osteoarthritis, left knee: Secondary | ICD-10-CM | POA: Diagnosis not present

## 2024-03-16 DIAGNOSIS — M7661 Achilles tendinitis, right leg: Secondary | ICD-10-CM

## 2024-03-16 NOTE — Patient Instructions (Signed)
 Continue your exercises, nitroglycerin  patches, physical therapy for your achilles.  Your knee pain is due to arthritis. These are the different medications you can take for this: Tylenol  500mg  1-2 tabs three times a day for pain. Voltaren  gel, capsaicin, aspercreme, or biofreeze topically up to four times a day may also help with pain. Some supplements that may help for arthritis: Boswellia extract, curcumin, pycnogenol Aleve  1-2 tabs twice a day with food Cortisone injections are an option if pain is severe. It's important that you continue to stay active. We will add physical therapy for this too Shoe inserts with good arch support may be helpful. Heat or ice 15 minutes at a time 3-4 times a day as needed to help with pain. Water aerobics and cycling with low resistance are the best two types of exercise for arthritis though any exercise is ok as long as it doesn't worsen the pain. Follow up with me in 6 weeks.

## 2024-03-16 NOTE — Progress Notes (Signed)
 PCP: [name], MD  Subjective:   HPI: Patient is a 53 y.o. female here for right achilles tendinopathy, bilateral knee pain.  8/27: Patient reports that her right-sided achilles tendonitis is 20-30% improved from prior. She has been dealing with the achilles pain since 05/2023. She has been attending formal physical therapy, applying 1/2 nitroglycerin  patches, and wearing a heel lift in her shoe. She says that she rarely has to rely on the walking boot. She has been taking Tramadol  and Tylenol  as needed for pain. Activities that make her pain worse include driving, prolonged periods of sitting, and walking. Of note, physical therapy would like to start Iontophoresis with Dexamethasone  and is looking for guidance regarding how to incorporate this around the nitroglycerin  patches.  10/8: Patient reports her achilles is about 60% improved. Doing physical therapy with iontophoresis, nitroglycerin  patches, home exercise program. Her knees are bothering her more recently though. Told in past she has arthritis. Has history of right partial meniscectomy a year ago. Pain is anterior. No injury. Does feel like knees buckle at times.  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, left (HCC) 01/23/2017   Cellulitis of right leg    Edema, lower extremity    GERD (gastroesophageal reflux disease)    Healthcare maintenance 03/25/2017   Herpesviral infection    Hot flashes 03/2017   Hyperlipidemia 03/20/2016   Impaired fasting glucose    Morbid obesity with body mass index (BMI) of 40.0 to 49.9 (HCC) 09/17/2017   Neoplasm of cervix    Osteoarthritis of knee    Other chest pain    Other specified disorders of veins     Personal history of chemotherapy    Personal history of radiation therapy    Polyneuropathy    Postmenopausal atrophic vaginitis    Pre-diabetes 07/26/2018   Hgb A1C elevated on 07/26/2018, Gestational Diabetes 2012   Pure hypercholesterolemia 03/30/2023   Seasonal allergies 2012   seasonal allergies causes allergic rhinitis and itchy, dry eyes per pt   Stress incontinence    Tachycardia    Thyrotoxicosis    Vitamin D  deficiency 05/2015    Current Outpatient Medications on File Prior to Visit  Medication Sig Dispense Refill   acetaminophen  (TYLENOL ) 500 MG tablet Take 1,000 mg by mouth every 6 (six) hours as needed (for pain/headaches.).      Ascorbic Acid (VITAMIN C PO) Take by mouth daily.     buPROPion  (WELLBUTRIN  SR) 200 MG 12 hr tablet Take 1 tablet (200 mg total) by mouth daily. 90 tablet 0   Calcium -Phosphorus-Vitamin D  (VITAMIN D3/CALCIUM /PHOSPHORUS PO) Take 600 mg by mouth daily.     cetirizine (ZYRTEC) 10 MG tablet Take 10 mg by mouth daily.     cyclobenzaprine  (FLEXERIL ) 10 MG tablet Take 1/2 tablet (5 mg total) by mouth 2 (two) times daily as needed. (Patient not taking: Reported on 03/08/2024) 30 tablet 0   cyclobenzaprine  (FLEXERIL ) 10 MG tablet Take 1/2 tablet (5 mg total) by mouth 2 (two) times daily as needed. 90 tablet 0   diclofenac  (VOLTAREN ) 75 MG EC tablet Take 1 tablet (75 mg total) by mouth 2 (two) times daily as needed. 60 tablet 0  gabapentin  (NEURONTIN ) 300 MG capsule Take 1 capsule (300 mg total) by mouth 3 (three) times daily. 270 capsule 0   letrozole  (FEMARA ) 2.5 MG tablet Take 1 tablet (2.5 mg total) by mouth daily. 90 tablet 4   loratadine  (CLARITIN ) 10 MG tablet Take 1 tablet (10 mg total) by mouth daily. (Patient taking differently: Take 10 mg by mouth daily as needed for allergies.) 90 tablet 2   losartan  (COZAAR ) 50 MG tablet Take 1 tablet (50 mg total) by mouth daily. 90 tablet 0   metFORMIN  (GLUCOPHAGE -XR) 500 MG 24 hr tablet Take 1 tablet (500  mg total) by mouth 2 (two) times daily with a meal. 180 tablet 0   methimazole  (TAPAZOLE ) 5 MG tablet Take 1 tablet (5 mg total) by mouth daily in the afternoon. 90 tablet 3   metoprolol  succinate (TOPROL -XL) 50 MG 24 hr tablet Take 1/2 tablet (25 mg total) by mouth daily. 90 tablet 0   montelukast  (SINGULAIR ) 10 MG tablet Take 1 tablet (10 mg total) by mouth at bedtime. 90 tablet 0   Multiple Vitamin (MULTIVITAMIN WITH MINERALS) TABS tablet Take 1 tablet by mouth daily.      nitroGLYCERIN  (NITRODUR - DOSED IN MG/24 HR) 0.2 mg/hr patch Apply 1/4th patch to affected achilles, change daily 30 patch 1   Omega-3 Fatty Acids (FISH OIL) 1000 MG CAPS Take 1,000 mg by mouth 2 (two) times daily.      omeprazole  (PRILOSEC) 40 MG capsule Take 1 capsule (40 mg total) by mouth daily 1/2 to 1 hour before morning meal. 90 capsule 0   ondansetron  (ZOFRAN ) 4 MG tablet Take 1 tablet (4 mg total) by mouth every 8 (eight) hours as needed for nausea or vomiting. 20 tablet 0   Probiotic Product (PROBIOTIC DAILY PO) Take 1 capsule by mouth 2 (two) times daily.      rosuvastatin  (CRESTOR ) 20 MG tablet Take 1 tablet (20 mg total) by mouth daily. 90 tablet 0   spironolactone  (ALDACTONE ) 25 MG tablet Take 1 tablet (25 mg total) by mouth daily. 90 tablet 0   traMADol  (ULTRAM ) 50 MG tablet Take 1-2 tablets (50-100 mg total) by mouth daily as needed. 30 tablet 1   ZINC OXIDE PO Take 1 tablet by mouth 2 (two) times a day.     Current Facility-Administered Medications on File Prior to Visit  Medication Dose Route Frequency Provider Last Rate Last Admin   sodium chloride  flush (NS) 0.9 % injection 10 mL  10 mL Intravenous PRN Magrinat, Gustav C, MD   10 mL at 03/10/17 1239    Past Surgical History:  Procedure Laterality Date   BREAST RECONSTRUCTION WITH PLACEMENT OF TISSUE EXPANDER AND FLEX HD (ACELLULAR HYDRATED DERMIS) Left 08/27/2017   Procedure: LEFT BREAST RECONSTRUCTION WITH PLACEMENT OF TISSUE EXPANDER AND FLEX HD;   Surgeon: Lowery Estefana RAMAN, DO;  Location: [facility];  Service: Plastics;  Laterality: Left;   CESAREAN SECTION     x2   KNEE ARTHROSCOPY WITH MEDIAL MENISECTOMY Right 11/12/2022   Procedure: RIGHT KNEE ARTHROSCOPY, PARTIAL MEDIAL MENISCECTOMY;  Surgeon: Jerri Kay HERO, MD;  Location: [facility];  Service: Orthopedics;  Laterality: Right;   LATISSIMUS FLAP TO BREAST Left 11/03/2018   LATISSIMUS FLAP TO BREAST Left 11/03/2018   Procedure: LATISSIMUS FLAP TO LEFT BREAST;  Surgeon: Lowery Estefana RAMAN, DO;  Location: [facility];  Service: Plastics;  Laterality: Left;   MASTECTOMY MODIFIED RADICAL Left 08/27/2017   MASTECTOMY MODIFIED RADICAL Left 08/27/2017  Procedure: LEFT MODIFIED RADICAL MASTECTOMY;  Surgeon: Vanderbilt Ned, MD;  Location: [facility];  Service: General;  Laterality: Left;   MASTOPEXY Right 03/03/2019   Procedure: RIGHT BREAST MASTOPEXY/REDUCTION;  Surgeon: Lowery Estefana RAMAN, DO;  Location: [facility];  Service: Plastics;  Laterality: Right;  3 hours, please   PORT-A-CATH REMOVAL Right 08/27/2017   Procedure: REMOVAL PORT-A-CATH RIGHT CHEST;  Surgeon: Vanderbilt Ned, MD;  Location: [facility];  Service: General;  Laterality: Right;   PORTACATH PLACEMENT Right 01/28/2017   Procedure: INSERTION PORT-A-CATH WITH ULTRASOUND;  Surgeon: Vanderbilt Ned, MD;  Location: [facility];  Service: General;  Laterality: Right;   REDUCTION MAMMAPLASTY Right    REMOVAL OF TISSUE EXPANDER AND PLACEMENT OF IMPLANT Left 03/03/2019   Procedure: REMOVAL OF TISSUE EXPANDER AND PLACEMENT OF EXPANDER;  Surgeon: Lowery Estefana RAMAN, DO;  Location: [facility];  Service: Plastics;  Laterality: Left;   REMOVAL OF TISSUE EXPANDER AND PLACEMENT OF IMPLANT Left 05/25/2019   Procedure: LEFT BREAST REMOVAL OF TISSUE EXPANDER AND PLACEMENT OF IMPLANT;  Surgeon: Lowery Estefana RAMAN, DO;  Location: [facility];  Service: Plastics;  Laterality: Left;   TISSUE  EXPANDER PLACEMENT Left 11/03/2018   Procedure: PLACEMENT OF TISSUE EXPANDER LEFT BREAST;  Surgeon: Lowery Estefana RAMAN, DO;  [facility]: [facility];  Service: Plastics;  Laterality: Left;  Total case time is 3.5 hours   TUBAL LIGATION Bilateral 01/21/2011    Allergies  Allergen Reactions   Dilaudid  [Hydromorphone ] Itching    BP (!) 146/81   Ht 5' 5 (1.651 m)   Wt 271 lb (122.9 kg)   LMP 11/28/2016   BMI 45.10 kg/m       No data to display              No data to display              Objective:  Physical Exam:  Gen: NAD, comfortable in exam room  Right foot/ankle: Minimal tenderness to palpation achilles insertion. Negative ant drawer and negative talar tilt.   Negative thompson. NV intact distally.  Bilateral knees: No gross deformity, ecchymoses, swelling. Mild TTP medial joint lines. FROM with normal strength. Negative ant/post drawers. Negative valgus/varus testing. Negative lachman.  Negative mcmurrays, apleys.  NV intact distally.   Assessment & Plan:  1. Right achilles tendinopathy - continue with physical therapy, home exercises, nitroglycerin  patches.  2. Bilateral knee pain - due to arthritis.  Tylenol , topical medications, supplements, nsaids reviewed.  Add physical therapy for this also.  Follow up in 6 weeks.

## 2024-03-18 ENCOUNTER — Ambulatory Visit: Admitting: Physical Therapy

## 2024-03-18 ENCOUNTER — Encounter: Payer: Self-pay | Admitting: Physical Therapy

## 2024-03-18 DIAGNOSIS — M25571 Pain in right ankle and joints of right foot: Secondary | ICD-10-CM | POA: Diagnosis not present

## 2024-03-18 DIAGNOSIS — M25671 Stiffness of right ankle, not elsewhere classified: Secondary | ICD-10-CM

## 2024-03-18 DIAGNOSIS — M25562 Pain in left knee: Secondary | ICD-10-CM | POA: Diagnosis not present

## 2024-03-18 DIAGNOSIS — G8929 Other chronic pain: Secondary | ICD-10-CM | POA: Diagnosis not present

## 2024-03-18 DIAGNOSIS — M25561 Pain in right knee: Secondary | ICD-10-CM | POA: Diagnosis not present

## 2024-03-18 NOTE — Therapy (Signed)
 OUTPATIENT PHYSICAL THERAPY LOWER EXTREMITY TREATMENT   Patient Name: Mackenzie [name] MRN: [id number] DOB:07-11-1970, 53 y.o., female Today's Date: 03/18/2024   PT End of Session - 03/18/24 1035     Visit Number 18    Date for Recertification  04/01/24    Authorization Type [employer]    PT Start Time 1032   pt late   PT Stop Time 1100    PT Time Calculation (min) 28 min            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, left (HCC) 01/23/2017   Cellulitis of right leg    Edema, lower extremity    GERD (gastroesophageal reflux disease)    Healthcare maintenance 03/25/2017   Herpesviral infection    Hot flashes 03/2017   Hyperlipidemia 03/20/2016   Impaired fasting glucose    Morbid obesity with body mass index (BMI) of 40.0 to 49.9 (HCC) 09/17/2017   Neoplasm of cervix    Osteoarthritis of knee    Other chest pain    Other specified disorders of veins    Personal history of chemotherapy    Personal history of radiation therapy    Polyneuropathy    Postmenopausal atrophic vaginitis    Pre-diabetes 07/26/2018   Hgb A1C elevated on 07/26/2018, Gestational Diabetes 2012   Pure hypercholesterolemia 03/30/2023   Seasonal allergies 2012   seasonal allergies causes allergic rhinitis and itchy, dry eyes per pt   Stress incontinence    Tachycardia    Thyrotoxicosis    Vitamin D  deficiency 05/2015   Past Surgical History:  Procedure Laterality Date   BREAST RECONSTRUCTION WITH PLACEMENT OF TISSUE EXPANDER AND FLEX HD (ACELLULAR HYDRATED DERMIS) Left 08/27/2017   Procedure: LEFT BREAST RECONSTRUCTION WITH PLACEMENT OF TISSUE EXPANDER AND FLEX HD;  Surgeon: Lowery Estefana RAMAN, DO;   Location: [facility];  Service: Plastics;  Laterality: Left;   CESAREAN SECTION     x2   KNEE ARTHROSCOPY WITH MEDIAL MENISECTOMY Right 11/12/2022   Procedure: RIGHT KNEE ARTHROSCOPY, PARTIAL MEDIAL MENISCECTOMY;  Surgeon: Jerri Kay HERO, MD;  Location: [facility];  Service: Orthopedics;  Laterality: Right;   LATISSIMUS FLAP TO BREAST Left 11/03/2018   LATISSIMUS FLAP TO BREAST Left 11/03/2018   Procedure: LATISSIMUS FLAP TO LEFT BREAST;  Surgeon: Lowery Estefana RAMAN, DO;  Location: [facility];  Service: Plastics;  Laterality: Left;   MASTECTOMY MODIFIED RADICAL Left 08/27/2017   MASTECTOMY MODIFIED RADICAL Left 08/27/2017   Procedure: LEFT MODIFIED RADICAL MASTECTOMY;  Surgeon: Vanderbilt Ned, MD;  Location: [facility];  Service: General;  Laterality: Left;   MASTOPEXY Right 03/03/2019   Procedure: RIGHT BREAST MASTOPEXY/REDUCTION;  Surgeon: Lowery Estefana RAMAN, DO;  Location: [facility];  Service: Plastics;  Laterality: Right;  3 hours, please   PORT-A-CATH REMOVAL Right 08/27/2017   Procedure: REMOVAL PORT-A-CATH RIGHT CHEST;  Surgeon: Vanderbilt Ned, MD;  Location[facility];  Service: General;  Laterality: Right;   PORTACATH PLACEMENT Right 01/28/2017   Procedure: INSERTION PORT-A-CATH WITH ULTRASOUND;  Surgeon: Vanderbilt Ned, MD;  Location: [facility];  Service: General;  Laterality: Right;   REDUCTION MAMMAPLASTY Right    REMOVAL  OF TISSUE EXPANDER AND PLACEMENT OF IMPLANT Left 03/03/2019   Procedure: REMOVAL OF TISSUE EXPANDER AND PLACEMENT OF EXPANDER;  Surgeon: Lowery Estefana RAMAN, DO;  Location: [facility];  Service: Plastics;  Laterality: Left;   REMOVAL OF TISSUE EXPANDER AND PLACEMENT OF IMPLANT Left 05/25/2019   Procedure: LEFT BREAST REMOVAL OF TISSUE EXPANDER AND PLACEMENT OF IMPLANT;  Surgeon: Lowery Estefana RAMAN, DO;  Location: [facility];  Service: Plastics;  Laterality: Left;   TISSUE EXPANDER PLACEMENT Left 11/03/2018    Procedure: PLACEMENT OF TISSUE EXPANDER LEFT BREAST;  Surgeon: Lowery Estefana RAMAN, DO;  Location: [facility];  Service: Plastics;  Laterality: Left;  Total case time is 3.5 hours   TUBAL LIGATION Bilateral 01/21/2011   Patient Active Problem List   Diagnosis Date Noted   SOBOE (shortness of breath on exertion) 11/11/2023   Primary osteoarthritis of left knee 10/09/2023   Achilles tendinitis, right leg 10/09/2023   Graves disease 07/16/2023   Multinodular goiter 07/16/2023   Right carpal tunnel syndrome 07/10/2023   Left carpal tunnel syndrome 07/10/2023   Pain of Right Knee 07/06/2023   History of breast reconstruction 05/15/2023   Pure hypercholesterolemia 03/30/2023   Acute meniscal tear, medial, right, initial encounter 11/12/2022   Plica syndrome of right knee 11/12/2022   Chondromalacia, right knee 11/12/2022   Depression 08/13/2022   BMI 40.0-44.9, adult (HCC) 08/13/2022   Obesity, Beginning BMI 08/13/2022   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   Boil of buttock, Left 12/24/2021   Hyperthyroidism 07/22/2021   Low serum thyroid  stimulating hormone (TSH) 06/12/2021   Other fatigue 05/23/2021   Hemoglobin low 05/23/2021   Polyphagia 05/16/2020   Vitamin D  deficiency 05/16/2020   At risk for osteoporosis 05/16/2020   Prediabetes 05/01/2020   Elevated blood pressure reading 05/01/2020   At risk for impaired metabolic function 05/01/2020   Breast asymmetry following reconstructive surgery 09/20/2019   Essential hypertension 08/17/2018   Class 3 severe obesity with serious comorbidity and body mass index (BMI) of 40.0 to 44.9 in adult 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   Healthcare maintenance 03/25/2017   Malignant 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, estrogen receptor positive (HCC) 01/23/2017   Carcinoma  of breast metastatic to axillary lymph node, left (HCC) 01/23/2017    PCP: [name], MD  REFERRING PROVIDER: [name], MD  THERAPY DIAG:  Stiffness of right ankle, not elsewhere classified  Pain in right ankle and joints of right foot  REFERRING DIAG: Right Achilles tendinitis [M76.61]   Rationale for Evaluation and Treatment:  Rehabilitation  SUBJECTIVE:  PERTINENT PAST HISTORY:  Hx of breast cancer, R knee pain, L knee pain      PRECAUTIONS: None  WEIGHT BEARING RESTRICTIONS No  FALLS:  Has patient fallen in last 6 months? No, Number of falls: 0  MOI/History of condition:  Onset date: January  SUBJECTIVE STATEMENT Saw MD who put in referral for my knees. The achilles is better but I would like to continue working on it too.     EVAL Mackenzie [name] is a 53 y.o. female who presents to clinic with chief complaint of R posterior ankle pain.  Started in January with no specific inciting event.  Has slowly worsened since that time.  The has used a boot as needed, she is using nitroglycerine patches,  she ices.  She has some modest improvement but nothing significant.  Attempted eccentrics which were too painful.     Pain:  Are you having pain? 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  Education administrator: NA  Hand Dominance: NA  Patient Goals/Specific Activities: improved comfort with walking (10 min currently, was walking up to 3 miles/day), yard work   OBJECTIVE:   DIAGNOSTIC FINDINGS:  None relevant  GENERAL OBSERVATION/GAIT:  Antalgic gait with reduced time in stance on R  PALPATION: TTP insertion of R achilles tendon  LE MMT:  MMT Right (Eval) Left (Eval) Right 01/20/24 Right 03/04/24 Right  03/18/24  Hip flexion (L2, L3)       Knee  extension (L3) 4 4     Knee flexion 4 4     Hip abduction       Hip extension       Hip external rotation       Hip internal rotation       Hip adduction       Ankle dorsiflexion (L4) 5 5     Ankle plantarflexion (S1) Partial ROM SL eel raise unable  Unable  Partial ROM  Ankle inversion 4 5 5     Ankle eversion 4 5 5     Great Toe ext (L5)       Grossly        (Blank rows = not tested, score listed is out of 5 possible points OR may be listed in lbs of force.  N = WNL, D = diminished, C = clear for gross weakness with myotome testing, * = concordant pain with testing)  LE ROM:  ROM Right (Eval) Left (Eval)  Hip flexion    Hip extension    Hip abduction    Hip adduction    Hip internal rotation    Hip external rotation    Knee extension    Knee flexion    Ankle dorsiflexion 10 10  Ankle plantarflexion 50 50  Ankle inversion 35 35  Ankle eversion 20 20   (Blank rows = not tested, N = WNL, * = concordant pain with testing)  Functional Tests  Eval 01/22/24 9/26  Progressive balance screen (highest level completed for >/= 10''):  Feet together: 10'' Semi Tandem: R in rear 10'', L in rear 10'' Tandem: R in rear 4'', L in rear 8'' SLS: R unable, L unable  SLS 28 sec right  SLS 41 sec Right  SLS 60 left            PATIENT SURVEYS:  LEFS: 33/80 LEFS: 59/80  TODAY'S TREATMENT: [facility] Adult PT Treatment:                                                DATE: 03/18/24 Therapeutic Exercise: Heel raise x 10 Iso heel raise 30 se x 4  Eccentric heel raise x10 on step right Slant board heel raise Slant board stretch intermittently as needed  Seated Right heel raise 2 inch step and 25#KB added x 30  Neuromuscular re-ed: AIREX tandem  AIREX SLS trials     [facility] Adult PT Treatment:  DATE: 03/11/24 Therapeutic Exercise: Isometric heel raise 30 sec x 4  Wooden rocker board x 1 minute Slant board stretch x 2  Standing  concentric heel raise to right eccentric lower x 10 each on 2 inch step Seated Right heel raise 2 inch step and 15# KB added x 30 Seated Soleus stretch with towel x2   Neuromuscular re-ed: SLS trails on foam oval 38 sec  Tandem on foam > 60 sec each             PATIENT EDUCATION ([place]/[facility]):  POC, diagnosis, prognosis, HEP, and outcome measures.  Pt educated via explanation, demonstration, and handout (HEP).  Pt confirms understanding verbally.   HOME EXERCISE PROGRAM: Access Code: [email or web address] URL: [email or web address] Date: 02/10/2024 Prepared by: Helene Gasmen  Program Notes toe Yoga  Exercises - Seated Plantar Fascia Stretch  - 1 x daily - 7 x weekly - 1 sets - 3 reps - Seated Arch Lifts  - 1 x daily - 7 x weekly - 3 sets - 10 reps - 5 hold - Seated Figure 4 Ankle Inversion with Resistance  - 1 x daily - 7 x weekly - 3 sets - 10 reps - Single Leg Stance  - 1 x daily - 7 x weekly - 1 sets - 4-5 reps - 30 to 60 seconds hold - Heel Raises with Counter Support  - 1 x daily - 7 x weekly - 1 sets - 4-5 reps - 30-45'' hold added - Eccentric Heel Lowering on Step  - 1 x daily - 7 x weekly - 2 sets - 10 reps   Eval 8/21       Progressive loading as tolerated Isometrics 30-45'' x4-5        R LE/hip strength                                 ASSESSMENT:  CLINICAL IMPRESSION: Mackenzie [name] tolerated session well with no adverse reaction. Saw MD who placed referral to add knees. Wants to continue with achilles rehab as well.  Told MD she was 60% improved in achilles pain. Pt reports no achilles pain lately.  Continues to be limited by knee pain.  Most goals met. Focusing on eccentric calf strength to reach remaining LTGs. She was able to complete a partial single leg heel raise on right today, for the first time. She is scheduled for an evaluation of bilat knees at next visit.   Eval: Mackenzie [name] is a 53 y.o. female who presents to clinic with signs and sxs consistent with  R posterior heel pain.  Consistent with physician impression of insertional achilles tendinopathy.   She is using a heel lift and nitro patches.  Tried eccentrics but this was too much load and agging.  Started with band isometrics and will progress as able.   Mackenzie [name] will benefit from skilled PT to address relevant deficits and improve comfort in walking and recreational activities.   OBJECTIVE IMPAIRMENTS: Pain, ankle ROM, gait, balance  ACTIVITY LIMITATIONS: walking, standing, housework, stairs  PERSONAL FACTORS: See medical history and pertinent history   REHAB POTENTIAL: Good  CLINICAL DECISION MAKING: Evolving/moderate complexity  EVALUATION COMPLEXITY: Moderate   GOALS:   SHORT TERM GOALS: Target date: 01/30/2024   Mackenzie [name] will be >75% HEP compliant to improve carryover between sessions and facilitate independent management of condition  Evaluation: ongoing Goal status: MET    LONG TERM GOALS: Target date: 02/27/2024 extended to  04/01/2024    Mackenzie [name] will self report >/= 50% decrease in pain from evaluation to improve function in daily tasks  Evaluation/Baseline: 10/10 max pain 02/12/24: 50% overall improvement Goal status: MET    2.  Mackenzie [name] will show a >/= 20 pt improvement in LEFS score (MCID is ~11% or 9 pts) as a proxy for functional improvement   Evaluation/Baseline: 33/80 pts 02/22/24: 59/80 Goal status: MET   3.  Mackenzie [name] will be able to complete yard work, including mowing her grass, not limited by pain  Evaluation/Baseline: limited 02/22/24: havent tried 03/04/24: did yard work Saturday, did not increase pain Goal status: ONGOING   4.  Mackenzie [name] will be able to walk for 30 min, not limited by pain   Evaluation/Baseline: 10 min 02/22/24: on feet at mall for 3 hours  Goal status: MET    5.  Mackenzie [name] will be able to perform at least partial ROM SL heel raise on R, not limited by pain  Evaluation/Baseline: unable 03/04/24: unable  03/18/24: met x  1 Goal status: ONGOING    PLAN: PT FREQUENCY: 1-2x/week  PT DURATION: 8 weeks  PLANNED INTERVENTIONS:  [id number]- PT Re-evaluation, 97110-Therapeutic exercises, 97530- Therapeutic activity, 97112- Neuromuscular re-education, 97535- Self Care, [id number]- Manual therapy, [id number]- Gait training, [id number]- Aquatic Therapy, [id number]- Electrical stimulation (manual), [id number]- Vasopneumatic device, [id number]- Traction (mechanical), [id number]- Ionotophoresis 4mg /ml Dexamethasone , Taping, Dry Needling, Joint manipulation, and Spinal manipulation.   Harlene Persons, PTA 03/18/24 10:59 AM Phone: [phone] Fax: [phone]

## 2024-03-21 ENCOUNTER — Other Ambulatory Visit: Payer: Self-pay

## 2024-03-24 ENCOUNTER — Encounter: Payer: Self-pay | Admitting: Physical Therapy

## 2024-03-24 ENCOUNTER — Ambulatory Visit: Admitting: Physical Therapy

## 2024-03-24 DIAGNOSIS — M25671 Stiffness of right ankle, not elsewhere classified: Secondary | ICD-10-CM | POA: Diagnosis not present

## 2024-03-24 DIAGNOSIS — M25562 Pain in left knee: Secondary | ICD-10-CM

## 2024-03-24 DIAGNOSIS — G8929 Other chronic pain: Secondary | ICD-10-CM

## 2024-03-24 DIAGNOSIS — M25571 Pain in right ankle and joints of right foot: Secondary | ICD-10-CM | POA: Diagnosis not present

## 2024-03-24 DIAGNOSIS — M25561 Pain in right knee: Secondary | ICD-10-CM | POA: Diagnosis not present

## 2024-03-24 NOTE — Therapy (Signed)
 OUTPATIENT PHYSICAL THERAPY Re-eval   Patient Name: Mackenzie [name] MRN: [id number] DOB:03-11-1971, 53 y.o., female Today's Date: 03/25/2024   PT End of Session - 03/24/24 1501     Visit Number 19    Date for Recertification  05/20/24    Authorization Type [employer] [facility]    PT Start Time 1500    PT Stop Time 1541    PT Time Calculation (min) 41 min            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, left (HCC) 01/23/2017   Cellulitis of right leg    Edema, lower extremity    GERD (gastroesophageal reflux disease)    Healthcare maintenance 03/25/2017   Herpesviral infection    Hot flashes 03/2017   Hyperlipidemia 03/20/2016   Impaired fasting glucose    Morbid obesity with body mass index (BMI) of 40.0 to 49.9 (HCC) 09/17/2017   Neoplasm of cervix    Osteoarthritis of knee    Other chest pain    Other specified disorders of veins    Personal history of chemotherapy    Personal history of radiation therapy    Polyneuropathy    Postmenopausal atrophic vaginitis    Pre-diabetes 07/26/2018   Hgb A1C elevated on 07/26/2018, Gestational Diabetes 2012   Pure hypercholesterolemia 03/30/2023   Seasonal allergies 2012   seasonal allergies causes allergic rhinitis and itchy, dry eyes per pt   Stress incontinence    Tachycardia    Thyrotoxicosis    Vitamin D  deficiency 05/2015   Past Surgical History:  Procedure Laterality Date   BREAST RECONSTRUCTION WITH PLACEMENT OF TISSUE EXPANDER AND FLEX HD (ACELLULAR HYDRATED DERMIS) Left 08/27/2017   Procedure: LEFT BREAST RECONSTRUCTION WITH PLACEMENT OF TISSUE EXPANDER AND FLEX HD;  Surgeon: Lowery Estefana RAMAN, DO;  Location: [facility];  Service:  Plastics;  Laterality: Left;   CESAREAN SECTION     x2   KNEE ARTHROSCOPY WITH MEDIAL MENISECTOMY Right 11/12/2022   Procedure: RIGHT KNEE ARTHROSCOPY, PARTIAL MEDIAL MENISCECTOMY;  Surgeon: Jerri Kay HERO, MD;  Location: [facility];  Service: Orthopedics;  Laterality: Right;   LATISSIMUS FLAP TO BREAST Left 11/03/2018   LATISSIMUS FLAP TO BREAST Left 11/03/2018   Procedure: LATISSIMUS FLAP TO LEFT BREAST;  Surgeon: Lowery Estefana RAMAN, DO;  Location: [facility];  Service: Plastics;  Laterality: Left;   MASTECTOMY MODIFIED RADICAL Left 08/27/2017   MASTECTOMY MODIFIED RADICAL Left 08/27/2017   Procedure: LEFT MODIFIED RADICAL MASTECTOMY;  Surgeon: Vanderbilt Ned, MD;  Location: [facility];  Service: General;  Laterality: Left;   MASTOPEXY Right 03/03/2019   Procedure: RIGHT BREAST MASTOPEXY/REDUCTION;  Surgeon: Lowery Estefana RAMAN, DO;  Location: [facility];  Service: Plastics;  Laterality: Right;  3 hours, please   PORT-A-CATH REMOVAL Right 08/27/2017   Procedure: REMOVAL PORT-A-CATH RIGHT CHEST;  Surgeon: Vanderbilt Ned, MD;  Location: [facility];  Service: General;  Laterality: Right;   PORTACATH PLACEMENT Right 01/28/2017   Procedure: INSERTION PORT-A-CATH WITH ULTRASOUND;  Surgeon: Vanderbilt Ned, MD;  Location: [facility];  Service: General;  Laterality: Right;   REDUCTION MAMMAPLASTY Right    REMOVAL OF TISSUE EXPANDER AND PLACEMENT  OF IMPLANT Left 03/03/2019   Procedure: REMOVAL OF TISSUE EXPANDER AND PLACEMENT OF EXPANDER;  Surgeon: Lowery Estefana RAMAN, DO;  Location: [facility];  Service: Plastics;  Laterality: Left;   REMOVAL OF TISSUE EXPANDER AND PLACEMENT OF IMPLANT Left 05/25/2019   Procedure: LEFT BREAST REMOVAL OF TISSUE EXPANDER AND PLACEMENT OF IMPLANT;  Surgeon: Lowery Estefana RAMAN, DO;  Location: [facility];  Service: Plastics;  Laterality: Left;   TISSUE EXPANDER PLACEMENT Left 11/03/2018   Procedure: PLACEMENT OF  TISSUE EXPANDER LEFT BREAST;  Surgeon: Lowery Estefana RAMAN, DO;  Location: [facility];  Service: Plastics;  Laterality: Left;  Total case time is 3.5 hours   TUBAL LIGATION Bilateral 01/21/2011   Patient Active Problem List   Diagnosis Date Noted   SOBOE (shortness of breath on exertion) 11/11/2023   Primary osteoarthritis of left knee 10/09/2023   Achilles tendinitis, right leg 10/09/2023   Graves disease 07/16/2023   Multinodular goiter 07/16/2023   Right carpal tunnel syndrome 07/10/2023   Left carpal tunnel syndrome 07/10/2023   Pain of Right Knee 07/06/2023   History of breast reconstruction 05/15/2023   Pure hypercholesterolemia 03/30/2023   Acute meniscal tear, medial, right, initial encounter 11/12/2022   Plica syndrome of right knee 11/12/2022   Chondromalacia, right knee 11/12/2022   Depression 08/13/2022   BMI 40.0-44.9, adult (HCC) 08/13/2022   Obesity, Beginning BMI 08/13/2022   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   Boil of buttock, Left 12/24/2021   Hyperthyroidism 07/22/2021   Low serum thyroid  stimulating hormone (TSH) 06/12/2021   Other fatigue 05/23/2021   Hemoglobin low 05/23/2021   Polyphagia 05/16/2020   Vitamin D  deficiency 05/16/2020   At risk for osteoporosis 05/16/2020   Prediabetes 05/01/2020   Elevated blood pressure reading 05/01/2020   At risk for impaired metabolic function 05/01/2020   Breast asymmetry following reconstructive surgery 09/20/2019   Essential hypertension 08/17/2018   Class 3 severe obesity with serious comorbidity and body mass index (BMI) of 40.0 to 44.9 in adult [facility]) 09/17/2017   Acquired absence of left breast 09/15/2017   Malignant neoplasm of upper-inner quadrant of left breast in female, 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   Healthcare maintenance 03/25/2017   Malignant 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, estrogen receptor positive (HCC) 01/23/2017   Carcinoma of breast metastatic to  axillary lymph node, left (HCC) 01/23/2017    PCP: [name], MD  REFERRING PROVIDER: Cleatrice Ludie SAUNDERS, MD  THERAPY DIAG:  Left knee pain, unspecified chronicity - Plan: PT plan of care cert/re-cert  Chronic pain of right knee - Plan: PT plan of care cert/re-cert  Stiffness of right ankle, not elsewhere classified - Plan: PT plan of care cert/re-cert  Pain in right ankle and joints of right foot - Plan: PT plan of care cert/re-cert  REFERRING DIAG: Primary osteoarthritis of left knee [M17.12], Chondromalacia, right knee [M94.261], Right Achilles tendinitis [M76.61]   Rationale for Evaluation and Treatment:  Rehabilitation  SUBJECTIVE:  PERTINENT PAST HISTORY:  Hx of breast cancer, R knee pain, L knee pain      PRECAUTIONS: None  WEIGHT BEARING RESTRICTIONS No  FALLS:  Has patient fallen in last 6 months? No, Number of falls: 0  MOI/History of condition:  Onset date: January  SUBJECTIVE STATEMENT Pt reports that he achilles has been doing well.  She has had slow increase in knee pain over the last year and would like to have her knee's evaluated.  She  reports that the majority of he pain in inferior and anterior.  She reports the pain is near constant when she walks.  Prior to this she felt more pain initially, but this would warm up with activity.  Denies significant swelling.  Denies any significant clicking or locking.  Reports history of meniscus tear on the R with menisectomy about 1.5 years ago.  MRI shows L meniscus root tear.    EVAL Karalee C Berke is a 53 y.o. female who presents to clinic with chief complaint of R posterior ankle pain.  Started in January with no specific inciting event.  Has slowly worsened since that time.  The has used a boot as needed, she is using nitroglycerine patches, she ices.  She has some modest improvement but nothing significant.  Attempted eccentrics which were too painful.     Pain:  Are you having pain? Yes Pain  location: R insertion of achilles tendon NPRS scale: 0/10 BEST: 2/10, Worst: 6/10 Aggravating factors: walking, driving, steps Relieving factors: rest, ice Pain description: sharp and aching  Are you having pain? 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 Moderate reactive joint effusion and small leaking Baker's cyst.   Heart size radial tear at the root of the medial meniscus with medial displacement of body. Mucoid degeneration in the posterior horn and body of the medial meniscus.  GENERAL OBSERVATION/GAIT:  Antalgic gait with reduced time in stance on R  PALPATION: TTP insertion of R achilles tendon  LE MMT:  MMT Right (Eval) Left (Eval) Right 01/20/24 Right 03/04/24 Right  03/18/24 R/L 10/16  Hip flexion (L2, L3)        Knee extension (L3) 4 4    4*/4*  Knee flexion 4 4    4*/4*  Hip abduction        Hip extension        Hip external rotation        Hip internal rotation        Hip adduction        Ankle dorsiflexion (L4) 5 5      Ankle plantarflexion (S1) Partial ROM SL eel raise unable  Unable  Partial ROM   Ankle inversion 4 5 5      Ankle eversion 4 5 5      Great Toe ext (L5)        Grossly         (Blank rows = not tested, score listed is out of 5 possible points OR may be listed in lbs of force.  N = WNL, D = diminished, C = clear for gross weakness with myotome testing, * = concordant pain with testing)  LE ROM:  ROM Right (Eval) Left (Eval) R/L 10/16  Hip flexion     Hip  extension     Hip abduction     Hip adduction     Hip internal rotation     Hip external rotation     Knee extension   Lacking 3/0  Knee flexion   110/95  Ankle dorsiflexion 10 10   Ankle plantarflexion  50 50   Ankle inversion 35 35   Ankle eversion 20 20    (Blank rows = not tested, N = WNL, * = concordant pain with testing)  Functional Tests  Eval 01/22/24 9/26  Progressive balance screen (highest level completed for >/= 10''):  Feet together: 10'' Semi Tandem: R in rear 10'', L in rear 10'' Tandem: R in rear 4'', L in rear 8'' SLS: R unable, L unable  SLS 28 sec right  SLS 41 sec Right  SLS 60 left            PATIENT SURVEYS:  LEFS: 33/80 LEFS: 59/80  TODAY'S TREATMENT:  [facility] Adult PT Treatment:                                                DATE: 03/24/24  Therapeutic Exercise: Quad set with towel SLR Updating HEP SAQ  RE-EVAL  [facility] Adult PT Treatment:                                                DATE: 03/18/24 Therapeutic Exercise: Heel raise x 10 Iso heel raise 30 se x 4  Eccentric heel raise x10 on step right Slant board heel raise Slant board stretch intermittently as needed  Seated Right heel raise 2 inch step and 25#KB added x 30  Neuromuscular re-ed: AIREX tandem  AIREX SLS trials     [facility] Adult PT Treatment:                                                DATE: 03/11/24 Therapeutic Exercise: Isometric heel raise 30 sec x 4  Wooden rocker board x 1 minute Slant board stretch x 2  Standing concentric heel raise to right eccentric lower x 10 each on 2 inch step Seated Right heel raise 2 inch step and 15# KB added x 30 Seated Soleus stretch with towel x2   Neuromuscular re-ed: SLS trails on foam oval 38 sec  Tandem on foam > 60 sec each             PATIENT EDUCATION ([place]/HM):  POC, diagnosis, prognosis, HEP, and outcome measures.  Pt educated via explanation, demonstration, and handout (HEP).  Pt confirms understanding  verbally.   HOME EXERCISE PROGRAM: Access Code: [email or web address] URL: [email or web address] Date: 03/24/2024 Prepared by: Helene Gasmen  Program Notes toe Yoga  Exercises - Single Leg Stance  - 1 x daily - 7 x weekly - 1 sets - 4-5 reps - 30 to 60 seconds hold - Standing Eccentric Heel Raise  - 1 x daily - 7 x weekly - 1 sets - 4-5 reps - 30'' hold - Supine Quad Set  - 3 x daily - 7 x weekly - 2 sets - 10 reps - 5 second hold - Active Straight Leg Raise with Quad Set  - 1 x daily - 7 x weekly - 2 sets - 10 reps   Eval 8/21       Progressive loading as tolerated Isometrics 30-45'' x4-5        R LE/hip strength  ASSESSMENT:  CLINICAL IMPRESSION: Re-eval for bil knees.  Consistent with degenerative changes given history of bil mensicus tears and diffuse pain.  Pt shows reduced knee strength and difficulty completing work tasks d/t pain.  She will benefit from skilled PT to address relevant deficits and improve comfort with standing, walking, and work tasks.  Eval: Armando is a 53 y.o. female who presents to clinic with signs and sxs consistent with R posterior heel pain.  Consistent with physician impression of insertional achilles tendinopathy.   She is using a heel lift and nitro patches.  Tried eccentrics but this was too much load and agging.  Started with band isometrics and will progress as able.   Ignacia will benefit from skilled PT to address relevant deficits and improve comfort in walking and recreational activities.   OBJECTIVE IMPAIRMENTS: Pain, ankle ROM, gait, balance  ACTIVITY LIMITATIONS: walking, standing, housework, stairs  PERSONAL FACTORS: See medical history and pertinent history   REHAB POTENTIAL: Good  CLINICAL DECISION MAKING: Evolving/moderate complexity  EVALUATION COMPLEXITY: Moderate   GOALS:   SHORT TERM GOALS: Target date: 01/30/2024   Destinae will be >75% HEP compliant to improve carryover between  sessions and facilitate independent management of condition  Evaluation: ongoing Goal status: MET    LONG TERM GOALS: Target date: 02/27/2024 extended to 05/20/2024    Pola will self report >/= 50% decrease in pain from evaluation to improve function in daily tasks  Evaluation/Baseline: 10/10 max pain 02/12/24: 50% overall improvement Goal status: MET    2.  Kathlen will show a >/= 20 pt improvement in LEFS score (MCID is ~11% or 9 pts) as a proxy for functional improvement   Evaluation/Baseline: 33/80 pts 02/22/24: 59/80 Goal status: MET   3.  Riven will be able to complete yard work, including mowing her grass, not limited by pain  Evaluation/Baseline: limited 02/22/24: havent tried 03/04/24: did yard work Saturday, did not increase pain Goal status: ONGOING   4.  Darlena will be able to walk for 30 min, not limited by pain   Evaluation/Baseline: 10 min 02/22/24: on feet at mall for 3 hours  Goal status: MET    5.  [place]ra will be able to perform at least partial ROM SL heel raise on R, not limited by pain  Evaluation/Baseline: unable 03/04/24: unable  03/18/24: met x 1 Goal status: ONGOING            6.  Alayha will improve the following MMTs to >/= 4+/5 with minimal pain to show improvement in strength:  knee ext and flexion   Evaluation/Baseline: see chart in note Goal status: NEW   7.  Kathrynne will be able to return to work with significant reduction in bil knee pain  Evaluation/Baseline: 10/10 pain by end of day Goal status: INITIAL    PLAN: PT FREQUENCY: 1-2x/week  PT DURATION: 8 weeks  PLANNED INTERVENTIONS:  [id number]- PT Re-evaluation, 97110-Therapeutic exercises, 97530- Therapeutic activity, [id number]- Neuromuscular re-education, 97535- Self Care, [id number]- Manual therapy, [id number]- Gait training, [id number]- Aquatic Therapy, [id number]- Electrical stimulation (manual), [id number]- Vasopneumatic device, [id number]- Traction (mechanical), [id number]- Ionotophoresis 4mg /ml  Dexamethasone , Taping, Dry Needling, Joint manipulation, and Spinal manipulation.   Helene BRAVO Dalayla Aldredge PT 03/25/24 8:22 AM Phone: [phone] Fax: [phone]

## 2024-03-25 ENCOUNTER — Encounter: Payer: Self-pay | Admitting: Physical Therapy

## 2024-03-31 ENCOUNTER — Encounter: Payer: Self-pay | Admitting: Physical Therapy

## 2024-03-31 ENCOUNTER — Ambulatory Visit: Payer: Self-pay | Admitting: Physical Therapy

## 2024-03-31 DIAGNOSIS — M25561 Pain in right knee: Secondary | ICD-10-CM | POA: Diagnosis not present

## 2024-03-31 DIAGNOSIS — M25571 Pain in right ankle and joints of right foot: Secondary | ICD-10-CM | POA: Diagnosis not present

## 2024-03-31 DIAGNOSIS — M25562 Pain in left knee: Secondary | ICD-10-CM | POA: Diagnosis not present

## 2024-03-31 DIAGNOSIS — G8929 Other chronic pain: Secondary | ICD-10-CM

## 2024-03-31 DIAGNOSIS — M25671 Stiffness of right ankle, not elsewhere classified: Secondary | ICD-10-CM | POA: Diagnosis not present

## 2024-03-31 NOTE — Therapy (Signed)
 OUTPATIENT PHYSICAL THERAPY TREATMENT   Patient Name: Mackenzie [name] MRN: [id number] DOB:September 03, 1970, 53 y.o., female Today's Date: 03/31/2024   PT End of Session - 03/31/24 1150     Visit Number 20    Date for Recertification  05/20/24    Authorization Type [employer]    PT Start Time 1148    PT Stop Time 1228    PT Time Calculation (min) 40 min            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, left (HCC) 01/23/2017   Cellulitis of right leg    Edema, lower extremity    GERD (gastroesophageal reflux disease)    Healthcare maintenance 03/25/2017   Herpesviral infection    Hot flashes 03/2017   Hyperlipidemia 03/20/2016   Impaired fasting glucose    Morbid obesity with body mass index (BMI) of 40.0 to 49.9 (HCC) 09/17/2017   Neoplasm of cervix    Osteoarthritis of knee    Other chest pain    Other specified disorders of veins    Personal history of chemotherapy    Personal history of radiation therapy    Polyneuropathy    Postmenopausal atrophic vaginitis    Pre-diabetes 07/26/2018   Hgb A1C elevated on 07/26/2018, Gestational Diabetes 2012   Pure hypercholesterolemia 03/30/2023   Seasonal allergies 2012   seasonal allergies causes allergic rhinitis and itchy, dry eyes per pt   Stress incontinence    Tachycardia    Thyrotoxicosis    Vitamin D  deficiency 05/2015   Past Surgical History:  Procedure Laterality Date   BREAST RECONSTRUCTION WITH PLACEMENT OF TISSUE EXPANDER AND FLEX HD (ACELLULAR HYDRATED DERMIS) Left 08/27/2017   Procedure: LEFT BREAST RECONSTRUCTION WITH PLACEMENT OF TISSUE EXPANDER AND FLEX HD;  Surgeon: Lowery Estefana RAMAN, DO;  Location: [facility];  Service:  Plastics;  Laterality: Left;   CESAREAN SECTION     x2   KNEE ARTHROSCOPY WITH MEDIAL MENISECTOMY Right 11/12/2022   Procedure: RIGHT KNEE ARTHROSCOPY, PARTIAL MEDIAL MENISCECTOMY;  Surgeon: Jerri Kay HERO, MD;  Location: [facility];  Service: Orthopedics;  Laterality: Right;   LATISSIMUS FLAP TO BREAST Left 11/03/2018   LATISSIMUS FLAP TO BREAST Left 11/03/2018   Procedure: LATISSIMUS FLAP TO LEFT BREAST;  Surgeon: Lowery Estefana RAMAN, DO;  Location: [facility];  Service: Plastics;  Laterality: Left;   MASTECTOMY MODIFIED RADICAL Left 08/27/2017   MASTECTOMY MODIFIED RADICAL Left 08/27/2017   Procedure: LEFT MODIFIED RADICAL MASTECTOMY;  Surgeon: Vanderbilt Ned, MD;  Location: [facility];  Service: General;  Laterality: Left;   MASTOPEXY Right 03/03/2019   Procedure: RIGHT BREAST MASTOPEXY/REDUCTION;  Surgeon: Lowery Estefana RAMAN, DO;  Location: [facility];  Service: Plastics;  Laterality: Right;  3 hours, please   PORT-A-CATH REMOVAL Right 08/27/2017   Procedure: REMOVAL PORT-A-CATH RIGHT CHEST;  Surgeon: Vanderbilt Ned, MD;  Location: [facility];  Service: General;  Laterality: Right;   PORTACATH PLACEMENT Right 01/28/2017   Procedure: INSERTION PORT-A-CATH WITH ULTRASOUND;  Surgeon: Vanderbilt Ned, MD;  Location: [facility];  Service: General;  Laterality: Right;   REDUCTION MAMMAPLASTY Right    REMOVAL OF TISSUE EXPANDER AND PLACEMENT  OF IMPLANT Left 03/03/2019   Procedure: REMOVAL OF TISSUE EXPANDER AND PLACEMENT OF EXPANDER;  Surgeon: Lowery Estefana RAMAN, DO;  Location: [facility];  Service: Plastics;  Laterality: Left;   REMOVAL OF TISSUE EXPANDER AND PLACEMENT OF IMPLANT Left 05/25/2019   Procedure: LEFT BREAST REMOVAL OF TISSUE EXPANDER AND PLACEMENT OF IMPLANT;  Surgeon: Lowery Estefana RAMAN, DO;  Location: [facility];  Service: Plastics;  Laterality: Left;   TISSUE EXPANDER PLACEMENT Left 11/03/2018   Procedure: PLACEMENT OF  TISSUE EXPANDER LEFT BREAST;  Surgeon: Lowery Estefana RAMAN, DO;  Location: [facility];  Service: Plastics;  Laterality: Left;  Total case time is 3.5 hours   TUBAL LIGATION Bilateral 01/21/2011   Patient Active Problem List   Diagnosis Date Noted   SOBOE (shortness of breath on exertion) 11/11/2023   Primary osteoarthritis of left knee 10/09/2023   Achilles tendinitis, right leg 10/09/2023   Graves disease 07/16/2023   Multinodular goiter 07/16/2023   Right carpal tunnel syndrome 07/10/2023   Left carpal tunnel syndrome 07/10/2023   Pain of Right Knee 07/06/2023   History of breast reconstruction 05/15/2023   Pure hypercholesterolemia 03/30/2023   Acute meniscal tear, medial, right, initial encounter 11/12/2022   Plica syndrome of right knee 11/12/2022   Chondromalacia, right knee 11/12/2022   Depression 08/13/2022   BMI 40.0-44.9, adult (HCC) 08/13/2022   Obesity, Beginning BMI 08/13/2022   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   Boil of buttock, Left 12/24/2021   Hyperthyroidism 07/22/2021   Low serum thyroid  stimulating hormone (TSH) 06/12/2021   Other fatigue 05/23/2021   Hemoglobin low 05/23/2021   Polyphagia 05/16/2020   Vitamin D  deficiency 05/16/2020   At risk for osteoporosis 05/16/2020   Prediabetes 05/01/2020   Elevated blood pressure reading 05/01/2020   At risk for impaired metabolic function 05/01/2020   Breast asymmetry following reconstructive surgery 09/20/2019   Essential hypertension 08/17/2018   Class 3 severe obesity with serious comorbidity and body mass index (BMI) of 40.0 to 44.9 in adult [facility]) 09/17/2017   Acquired absence of left breast 09/15/2017   Malignant neoplasm of upper-inner quadrant of left breast in female, 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   Healthcare maintenance 03/25/2017   Malignant 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, estrogen receptor positive (HCC) 01/23/2017   Carcinoma of breast metastatic to  axillary lymph node, left (HCC) 01/23/2017    PCP: [name], MD  REFERRING PROVIDER: [name], MD  THERAPY DIAG:  Left knee pain, unspecified chronicity  Chronic pain of right knee  REFERRING DIAG: Primary osteoarthritis of left knee [M17.12], Chondromalacia, right knee [M94.261], Right Achilles tendinitis [M76.61]   Rationale for Evaluation and Treatment:  Rehabilitation  SUBJECTIVE:  PERTINENT PAST HISTORY:  Hx of breast cancer, R knee pain, L knee pain      PRECAUTIONS: None  WEIGHT BEARING RESTRICTIONS No  FALLS:  Has patient fallen in last 6 months? No, Number of falls: 0  MOI/History of condition:  Onset date: January  SUBJECTIVE STATEMENT Pt reports that he achilles has been doing well.  She has had slow increase in knee pain over the last year and would like to have her knee's evaluated.  She reports that the majority of he pain in inferior and anterior.  She reports the pain is near constant when she walks.  Prior to this she felt more pain initially, but this would warm up with activity.  Denies significant swelling.  Denies any significant  clicking or locking.  Reports history of meniscus tear on the R with menisectomy about 1.5 years ago.  MRI shows L meniscus root tear.    EVAL Alba C Rasool is a 53 y.o. female who presents to clinic with chief complaint of R posterior ankle pain.  Started in January with no specific inciting event.  Has slowly worsened since that time.  The has used a boot as needed, she is using nitroglycerine patches, she ices.  She has some modest improvement but nothing significant.  Attempted eccentrics which were too painful.     Pain:  Are you having pain? Yes Pain location: R insertion of achilles tendon NPRS scale: 0/10 BEST: 2/10, Worst: 6/10 Aggravating factors: walking, driving, steps Relieving factors: rest, ice Pain description: sharp and aching  Are you having pain? 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 Moderate reactive joint effusion and small leaking Baker's cyst.   Heart size radial tear at the root of the medial meniscus with medial displacement of body. Mucoid degeneration in the posterior horn and body of the medial meniscus.  GENERAL OBSERVATION/GAIT:  Antalgic gait with reduced time in stance on R  PALPATION: TTP insertion of R achilles tendon  LE MMT:  MMT Right (Eval) Left (Eval) Right 01/20/24 Right 03/04/24 Right  03/18/24 R/L 10/16  Hip flexion (L2, L3)        Knee extension (L3) 4 4    4*/4*  Knee flexion 4 4    4*/4*  Hip abduction        Hip extension        Hip external rotation        Hip internal rotation        Hip adduction        Ankle dorsiflexion (L4) 5 5      Ankle plantarflexion (S1) Partial ROM SL eel raise unable  Unable  Partial ROM   Ankle inversion 4 5 5      Ankle eversion 4 5 5      Great Toe ext (L5)        Grossly         (Blank rows = not tested, score listed is out of 5 possible points OR may be listed in lbs of force.  N = WNL, D = diminished, C = clear for gross weakness with myotome testing, * = concordant pain with testing)  LE ROM:  ROM Right (Eval) Left (Eval) R/L 10/16  Hip flexion     Hip extension     Hip abduction     Hip adduction     Hip internal rotation     Hip external rotation     Knee extension   Lacking 3/0  Knee flexion   110/95  Ankle dorsiflexion 10 10   Ankle plantarflexion 50 50   Ankle inversion 35 35   Ankle  eversion 20 20    (Blank rows = not tested, N = WNL, * = concordant pain with testing)  Functional Tests  Eval 01/22/24 9/26  Progressive balance screen (highest level completed for >/=  10''):  Feet together: 10'' Semi Tandem: R in rear 10'', L in rear 10'' Tandem: R in rear 4'', L in rear 8'' SLS: R unable, L unable  SLS 28 sec right  SLS 41 sec Right  SLS 60 left            PATIENT SURVEYS:  LEFS: 33/80 LEFS: 59/80  TODAY'S TREATMENT: [facility] Adult PT Treatment:                                                DATE: 03/31/24 Therapeutic Exercise: McConnel Tape medial pull to both patellas STS x 5  QS SLR Hip abdct 10 x 2 each Bilat heel raise to single eccentric lower  Wooden rocker Board  Tandem stance SLS on oval with hip abduction 2 x 10 each - light UE assist Slant board stretch    [facility] Adult PT Treatment:                                                DATE: 03/24/24  Therapeutic Exercise: Quad set with towel SLR Updating HEP SAQ  RE-EVAL  [facility] Adult PT Treatment:                                                DATE: 03/18/24 Therapeutic Exercise: Heel raise x 10 Iso heel raise 30 se x 4  Eccentric heel raise x10 on step right Slant board heel raise Slant board stretch intermittently as needed  Seated Right heel raise 2 inch step and 25#KB added x 30  Neuromuscular re-ed: AIREX tandem  AIREX SLS trials     [facility] Adult PT Treatment:                                                DATE: 03/11/24 Therapeutic Exercise: Isometric heel raise 30 sec x 4  Wooden rocker board x 1 minute Slant board stretch x 2  Standing concentric heel raise to right eccentric lower x 10 each on 2 inch step Seated Right heel raise 2 inch step and 15# KB added x 30 Seated Soleus stretch with towel x2   Neuromuscular re-ed: SLS trails on foam oval 38 sec  Tandem on foam > 60 sec each             PATIENT EDUCATION ([place]er/HM):  POC, diagnosis, prognosis, HEP, and  outcome measures.  Pt educated via explanation, demonstration, and handout (HEP).  Pt confirms understanding verbally.   HOME EXERCISE PROGRAM: Access Code: [email or web address] URL: [email or web address] Date: 03/24/2024 Prepared by: Helene Gasmen  Program Notes toe Yoga  Exercises - Single Leg Stance  - 1 x daily - 7 x weekly - 1 sets - 4-5 reps - 30 to 60 seconds hold - Standing Eccentric Heel Raise  - 1 x daily - 7 x weekly - 1 sets - 4-5 reps - 30'' hold - Supine Quad Set  - 3 x daily - 7 x weekly -  2 sets - 10 reps - 5 second hold - Active Straight Leg Raise with Quad Set  - 1 x daily - 7 x weekly - 2 sets - 10 reps   Eval 8/21       Progressive loading as tolerated Isometrics 30-45'' x4-5        R LE/hip strength                                 ASSESSMENT:  CLINICAL IMPRESSION: Trial of Patella tracking tape with pt reporting decreased knee pain with walking and transfers after application, not pain free. Continued quad activation and lateral hip strength.  Continued calf strengthening and ankle stability. Will assess benefit of tape and continue PRN. She also has tape at home.  She will benefit from skilled PT to address relevant deficits and improve comfort with standing, walking, and work tasks.  Eval: Carmelle is a 53 y.o. female who presents to clinic with signs and sxs consistent with R posterior heel pain.  Consistent with physician impression of insertional achilles tendinopathy.   She is using a heel lift and nitro patches.  Tried eccentrics but this was too much load and agging.  Started with band isometrics and will progress as able.   Lamia will benefit from skilled PT to address relevant deficits and improve comfort in walking and recreational activities.   OBJECTIVE IMPAIRMENTS: Pain, ankle ROM, gait, balance  ACTIVITY LIMITATIONS: walking, standing, housework, stairs  PERSONAL FACTORS: See medical history and pertinent history   REHAB POTENTIAL:  Good  CLINICAL DECISION MAKING: Evolving/moderate complexity  EVALUATION COMPLEXITY: Moderate   GOALS:   SHORT TERM GOALS: Target date: 01/30/2024   Hydee will be >75% HEP compliant to improve carryover between sessions and facilitate independent management of condition  Evaluation: ongoing Goal status: MET    LONG TERM GOALS: Target date: 02/27/2024 extended to 05/20/2024    Demetrius will self report >/= 50% decrease in pain from evaluation to improve function in daily tasks  Evaluation/Baseline: 10/10 max pain 02/12/24: 50% overall improvement Goal status: MET    2.  Kiriana will show a >/= 20 pt improvement in LEFS score (MCID is ~11% or 9 pts) as a proxy for functional improvement   Evaluation/Baseline: 33/80 pts 02/22/24: 59/80 Goal status: MET   3.  Aalayah will be able to complete yard work, including mowing her grass, not limited by pain  Evaluation/Baseline: limited 02/22/24: havent tried 03/04/24: did yard work Saturday, did not increase pain Goal status: ONGOING   4.  Talaysha will be able to walk for 30 min, not limited by pain   Evaluation/Baseline: 10 min 02/22/24: on feet at mall for 3 hours  Goal status: MET    5.  Kineta will be able to perform at least partial ROM SL heel raise on R, not limited by pain  Evaluation/Baseline: unable 03/04/24: unable  03/18/24: met x 1 Goal status: ONGOING            6.  Tobi will improve the following MMTs to >/= 4+/5 with minimal pain to show improvement in strength:  knee ext and flexion   Evaluation/Baseline: see chart in note Goal status: NEW   7.  Demmi will be able to return to work with significant reduction in bil knee pain  Evaluation/Baseline: 10/10 pain by end of day Goal status: INITIAL    PLAN: PT FREQUENCY: 1-2x/week  PT DURATION: 8  weeks  PLANNED INTERVENTIONS:  [id number]- PT Re-evaluation, 97110-Therapeutic exercises, 97530- Therapeutic activity, [id number]- Neuromuscular re-education,  97535- Self Care, [id number]- Manual therapy, [id number]- Gait training, [id number]- Aquatic Therapy, [phone]- Electrical stimulation (manual), [id number]- Vasopneumatic device, [id number]- Traction (mechanical), [id number]- Ionotophoresis 4mg /ml Dexamethasone , Taping, Dry Needling, Joint manipulation, and Spinal manipulation.   Harlene CHRISTELLA Persons PTA 03/31/24 12:40 PM Phone: [phone] Fax: [phone]

## 2024-04-02 ENCOUNTER — Other Ambulatory Visit (HOSPITAL_COMMUNITY): Payer: Self-pay

## 2024-04-05 ENCOUNTER — Other Ambulatory Visit (HOSPITAL_COMMUNITY): Payer: Self-pay

## 2024-04-05 ENCOUNTER — Ambulatory Visit (INDEPENDENT_AMBULATORY_CARE_PROVIDER_SITE_OTHER): Admitting: Adult Health

## 2024-04-05 ENCOUNTER — Encounter (INDEPENDENT_AMBULATORY_CARE_PROVIDER_SITE_OTHER): Payer: Self-pay | Admitting: Adult Health

## 2024-04-05 VITALS — BP 99/68 | HR 79 | Temp 97.5°F | Ht 65.0 in | Wt 264.0 lb

## 2024-04-05 DIAGNOSIS — R7303 Prediabetes: Secondary | ICD-10-CM

## 2024-04-05 DIAGNOSIS — I10 Essential (primary) hypertension: Secondary | ICD-10-CM | POA: Diagnosis not present

## 2024-04-05 DIAGNOSIS — E7800 Pure hypercholesterolemia, unspecified: Secondary | ICD-10-CM | POA: Diagnosis not present

## 2024-04-05 DIAGNOSIS — F5089 Other specified eating disorder: Secondary | ICD-10-CM

## 2024-04-05 DIAGNOSIS — E669 Obesity, unspecified: Secondary | ICD-10-CM

## 2024-04-05 DIAGNOSIS — K760 Fatty (change of) liver, not elsewhere classified: Secondary | ICD-10-CM

## 2024-04-05 DIAGNOSIS — Z6841 Body Mass Index (BMI) 40.0 and over, adult: Secondary | ICD-10-CM

## 2024-04-05 DIAGNOSIS — R0602 Shortness of breath: Secondary | ICD-10-CM

## 2024-04-05 DIAGNOSIS — E559 Vitamin D deficiency, unspecified: Secondary | ICD-10-CM | POA: Diagnosis not present

## 2024-04-05 DIAGNOSIS — F3289 Other specified depressive episodes: Secondary | ICD-10-CM

## 2024-04-05 MED ORDER — FLUZONE 0.5 ML IM SUSY
0.5000 mL | PREFILLED_SYRINGE | Freq: Once | INTRAMUSCULAR | 0 refills | Status: AC
  Filled 2024-04-05: qty 0.5, 1d supply, fill #0

## 2024-04-05 NOTE — Progress Notes (Signed)
 WEIGHT SUMMARY AND BIOMETRICS  Vitals Temp: (!) 97.5 F (36.4 C) BP: 99/68 Pulse Rate: 79 SpO2: 98 %   Anthropometric Measurements Height: 5' 5 (1.651 m) Weight: 264 lb (119.7 kg) BMI (Calculated): 43.93 Weight at Last Visit: 271 lb Weight Lost Since Last Visit: 6 lb Weight Gained Since Last Visit: 0 Starting Weight: 267 lb Total Weight Loss (lbs): 0 lb (0 kg) Peak Weight: 320 lb   Body Composition  Body Fat %: 49.5 % Fat Mass (lbs): 130.8 lbs Muscle Mass (lbs): 126.8 lbs Total Body Water (lbs): 86.8 lbs Visceral Fat Rating : 16   Other Clinical Data Fasting: yes Today's Visit #: 6 Starting Date: 08/09/19    Chief Complaint:   OBESITY Mackenzie [name] is here to discuss her progress with her obesity treatment plan.  She is on the the Category 3 Plan and states she is following her eating plan approximately 70 % of the time.  She states she is exercising Walking/Water Aerobics 30/45 minutes 2/1 times per week.  Interim History:  Reviewed Bioimpedance Results with pt: Muscle Mass: +1.4 lbs Adipose Mass: -8.2 lbs  Hunger/appetite-stable appetite Cravings- none Stress- life is stable Sleep- she estimates to sleep 5-6 hours nightly Exercise-she will walk after she takes her daughter to bus stop twice weekly and she has started water aerobics class over weekend at [employer]  Subjective:   1. Shortness of breath  11/11/23 08:00  RMR 1901   Recommend rechecking RMR with IC next round of fasting labs  2. Hepatic steatosis She denies RUQ pain She will have her first colonoscopy late 2025 She denies known family hx of colorectal cancer  3. Pre-diabetes She is on Metformin  XR 500mg  BID with meals. She is interested in joining the [employer] program with local [facility]- will place referral  4. Essential hypertension BP soft, yet stable at OV She has increased regular cardiovascular exercise  5. Vitamin D  deficiency She endorses chronic fatigue She estimates to  sleep 5-6 hours nightly  6. Emotional Eating Behavior She endorses stable mood, denies SI/HI She is on daily Wellbutrin  SR 200mg  She denies hx of seizure d/o and she is post menopausal   Assessment/Plan:   1. Shortness of breath Recommend rechecking RMR with IC next round of fasting labs  2. Hepatic steatosis (Primary) Check Labs -CMP Referral -Nutrition Services Reviewed how to normalize Liver Enzymes: Lifestyle changes are typically the first line of treatment:  Lose weight: If overweight or obese, losing even a small amount of weight can significantly improve liver health.  Improve diet: Eat a healthy diet rich in fruits, vegetables, whole grains, and lean protein. Avoid processed foods, sugary drinks, and excessive alcohol consumption.  Exercise regularly: Aim for at least 150 minutes of moderate-intensity exercise per week.  Manage diabetes and high blood pressure: Control blood sugar levels and blood pressure to reduce the risk of liver damage.   3. Pre-diabetes Check Labs -A1c -Insulin  Random -Mg++ Level  Referral P.R.E.P.  4. Essential hypertension Check Labs -CMP  5. Vitamin D  deficiency Check Labs -Vit D Level  6. Emotional Eating Behavior Continue healthy eating and regular exercise  7. Obesity, Current BMI 51.1  Mackenzie [name] is currently in the action stage of change. As such, her goal is to continue with weight loss efforts. She has agreed to the Category 3 Plan.   Exercise goals: For substantial health benefits, adults should do at least 150 minutes (2 hours and 30 minutes) a week of moderate-intensity, or  75 minutes (1 hour and 15 minutes) a week of vigorous-intensity aerobic physical activity, or an equivalent combination of moderate- and vigorous-intensity aerobic activity. Aerobic activity should be performed in episodes of at least 10 minutes, and preferably, it should be spread throughout the week.  Behavioral modification strategies: increasing lean  protein intake, decreasing simple carbohydrates, increasing vegetables, increasing water intake, no skipping meals, meal planning and cooking strategies, keeping healthy foods in the home, ways to avoid boredom eating, and planning for success.  Mackenzie [name] has agreed to follow-up with our clinic in 4 weeks. She was informed of the importance of frequent follow-up visits to maximize her success with intensive lifestyle modifications for her multiple health conditions.   Mackenzie [name] was informed we would discuss her lab results at her next visit unless there is a critical issue that needs to be addressed sooner. Mackenzie [name] agreed to keep her next visit at the agreed upon time to discuss these results.  Objective:   Blood pressure 99/68, pulse 79, temperature (!) 97.5 F (36.4 C), height 5' 5 (1.651 m), weight 264 lb (119.7 kg), last menstrual period 11/28/2016, SpO2 98%, unknown if currently breastfeeding. Body mass index is 43.93 kg/m.  General: Cooperative, alert, well developed, in no acute distress. HEENT: Conjunctivae and lids unremarkable. Cardiovascular: Regular rhythm.  Lungs: Normal work of breathing. Neurologic: No focal deficits.   Lab Results  Component Value Date   CREATININE 0.89 12/01/2023   BUN 21 (H) 12/01/2023   NA 135 12/01/2023   K 4.2 12/01/2023   CL 103 12/01/2023   CO2 25 12/01/2023   Lab Results  Component Value Date   ALT 22 12/01/2023   AST 16 12/01/2023   GGT 49 06/09/2023   ALKPHOS 118 12/01/2023   BILITOT 0.4 12/01/2023   Lab Results  Component Value Date   HGBA1C 5.9 (H) 09/15/2023   HGBA1C 6.1 (H) 06/08/2023   HGBA1C 6.0 (H) 01/19/2023   HGBA1C 5.8 (H) 09/03/2022   HGBA1C 5.4 01/29/2022   Lab Results  Component Value Date   INSULIN  14.7 09/15/2023   INSULIN  24.1 06/08/2023   INSULIN  15.4 01/19/2023   INSULIN  13.1 09/03/2022   INSULIN  21.3 01/29/2022   Lab Results  Component Value Date   TSH 1.73 01/13/2024   Lab Results  Component Value  Date   CHOL 197 06/08/2023   HDL 56 06/08/2023   LDLCALC 124 (H) 06/08/2023   TRIG 93 06/08/2023   CHOLHDL 3.0 03/31/2023   Lab Results  Component Value Date   VD25OH 75.1 09/15/2023   VD25OH 65.8 06/08/2023   VD25OH 76.5 01/19/2023   Lab Results  Component Value Date   WBC 5.7 12/01/2023   HGB 12.6 12/01/2023   HCT 38.0 12/01/2023   MCV 82.4 12/01/2023   PLT 225 12/01/2023   No results found for: IRON, TIBC, FERRITIN  Attestation Statements:   Reviewed by clinician on day of visit: allergies, medications, problem list, medical history, surgical history, family history, social history, and previous encounter notes.  I have reviewed the above documentation for accuracy and completeness, and I agree with the above. -  Latresa Gasser d. Coila Wardell, NP-C

## 2024-04-06 LAB — COMPREHENSIVE METABOLIC PANEL WITH GFR
ALT: 21 IU/L (ref 0–32)
AST: 19 IU/L (ref 0–40)
Albumin: 4.3 g/dL (ref 3.8–4.9)
Alkaline Phosphatase: 139 IU/L — ABNORMAL HIGH (ref 49–135)
BUN/Creatinine Ratio: 18 (ref 9–23)
BUN: 17 mg/dL (ref 6–24)
Bilirubin Total: 0.3 mg/dL (ref 0.0–1.2)
CO2: 20 mmol/L (ref 20–29)
Calcium: 9.6 mg/dL (ref 8.7–10.2)
Chloride: 99 mmol/L (ref 96–106)
Creatinine, Ser: 0.97 mg/dL (ref 0.57–1.00)
Globulin, Total: 3.2 g/dL (ref 1.5–4.5)
Glucose: 120 mg/dL — ABNORMAL HIGH (ref 70–99)
Potassium: 4.8 mmol/L (ref 3.5–5.2)
Sodium: 135 mmol/L (ref 134–144)
Total Protein: 7.5 g/dL (ref 6.0–8.5)
eGFR: 69 mL/min/1.73 (ref >59)

## 2024-04-06 LAB — VITAMIN D 25 HYDROXY (VIT D DEFICIENCY, FRACTURES): Vit D, 25-Hydroxy: 91.8 ng/mL (ref 30.0–100.0)

## 2024-04-06 LAB — LIPID PANEL
Chol/HDL Ratio: 3.3 ratio (ref 0.0–4.4)
Cholesterol, Total: 146 mg/dL (ref 100–199)
HDL: 44 mg/dL (ref >39)
LDL Chol Calc (NIH): 90 mg/dL (ref 0–99)
Triglycerides: 57 mg/dL (ref 0–149)
VLDL Cholesterol Cal: 12 mg/dL (ref 5–40)

## 2024-04-06 LAB — HEMOGLOBIN A1C
Est. average glucose Bld gHb Est-mCnc: 131 mg/dL
Hgb A1c MFr Bld: 6.2 % — ABNORMAL HIGH (ref 4.8–5.6)

## 2024-04-06 LAB — MAGNESIUM: Magnesium: 2.2 mg/dL (ref 1.6–2.3)

## 2024-04-06 LAB — INSULIN, RANDOM: INSULIN: 22 u[IU]/mL (ref 2.6–24.9)

## 2024-04-06 LAB — VITAMIN B12: Vitamin B-12: 1297 pg/mL — ABNORMAL HIGH (ref 232–1245)

## 2024-04-07 ENCOUNTER — Ambulatory Visit: Admitting: Physical Therapy

## 2024-04-07 ENCOUNTER — Other Ambulatory Visit (HOSPITAL_COMMUNITY): Payer: Self-pay

## 2024-04-07 ENCOUNTER — Encounter (HOSPITAL_COMMUNITY): Payer: Self-pay

## 2024-04-07 ENCOUNTER — Encounter: Payer: Self-pay | Admitting: Physical Therapy

## 2024-04-07 DIAGNOSIS — M25561 Pain in right knee: Secondary | ICD-10-CM | POA: Diagnosis not present

## 2024-04-07 DIAGNOSIS — M25562 Pain in left knee: Secondary | ICD-10-CM | POA: Diagnosis not present

## 2024-04-07 DIAGNOSIS — M25671 Stiffness of right ankle, not elsewhere classified: Secondary | ICD-10-CM | POA: Diagnosis not present

## 2024-04-07 DIAGNOSIS — G8929 Other chronic pain: Secondary | ICD-10-CM

## 2024-04-07 DIAGNOSIS — M25571 Pain in right ankle and joints of right foot: Secondary | ICD-10-CM | POA: Diagnosis not present

## 2024-04-07 NOTE — Therapy (Signed)
 OUTPATIENT PHYSICAL THERAPY TREATMENT   Patient Name: Mackenzie [name] MRN: [id number] DOB:07-Apr-1971, 53 y.o., female Today's Date: 04/07/2024   PT End of Session - 04/07/24 0854     Visit Number 21    Date for Recertification  05/20/24    Authorization Type [employer]    PT Start Time [phone]    PT Stop Time 0932    PT Time Calculation (min) 38 min            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, left (HCC) 01/23/2017   Cellulitis of right leg    Edema, lower extremity    GERD (gastroesophageal reflux disease)    Healthcare maintenance 03/25/2017   Herpesviral infection    Hot flashes 03/2017   Hyperlipidemia 03/20/2016   Impaired fasting glucose    Morbid obesity with body mass index (BMI) of 40.0 to 49.9 (HCC) 09/17/2017   Neoplasm of cervix    Osteoarthritis of knee    Other chest pain    Other specified disorders of veins    Personal history of chemotherapy    Personal history of radiation therapy    Polyneuropathy    Postmenopausal atrophic vaginitis    Pre-diabetes 07/26/2018   Hgb A1C elevated on 07/26/2018, Gestational Diabetes 2012   Pure hypercholesterolemia 03/30/2023   Seasonal allergies 2012   seasonal allergies causes allergic rhinitis and itchy, dry eyes per pt   Stress incontinence    Tachycardia    Thyrotoxicosis    Vitamin D  deficiency 05/2015   Past Surgical History:  Procedure Laterality Date   BREAST RECONSTRUCTION WITH PLACEMENT OF TISSUE EXPANDER AND FLEX HD (ACELLULAR HYDRATED DERMIS) Left 08/27/2017   Procedure: LEFT BREAST RECONSTRUCTION WITH PLACEMENT OF TISSUE EXPANDER AND FLEX HD;  Surgeon: Lowery Estefana RAMAN, DO;  Location: [facility];  Service:  Plastics;  Laterality: Left;   CESAREAN SECTION     x2   KNEE ARTHROSCOPY WITH MEDIAL MENISECTOMY Right 11/12/2022   Procedure: RIGHT KNEE ARTHROSCOPY, PARTIAL MEDIAL MENISCECTOMY;  Surgeon: Jerri Kay HERO, MD;  Location: [facility];  Service: Orthopedics;  Laterality: Right;   LATISSIMUS FLAP TO BREAST Left 11/03/2018   LATISSIMUS FLAP TO BREAST Left 11/03/2018   Procedure: LATISSIMUS FLAP TO LEFT BREAST;  Surgeon: Lowery Estefana RAMAN, DO;  Location: [facility];  Service: Plastics;  Laterality: Left;   MASTECTOMY MODIFIED RADICAL Left 08/27/2017   MASTECTOMY MODIFIED RADICAL Left 08/27/2017   Procedure: LEFT MODIFIED RADICAL MASTECTOMY;  Surgeon: Vanderbilt Ned, MD;  Location: [facility];  Service: General;  Laterality: Left;   MASTOPEXY Right 03/03/2019   Procedure: RIGHT BREAST MASTOPEXY/REDUCTION;  Surgeon: Lowery Estefana RAMAN, DO;  Location: [facility];  Service: Plastics;  Laterality: Right;  3 hours, please   PORT-A-CATH REMOVAL Right 08/27/2017   Procedure: REMOVAL PORT-A-CATH RIGHT CHEST;  Surgeon: Vanderbilt Ned, MD;  Location: [facility];  Service: General;  Laterality: Right;   PORTACATH PLACEMENT Right 01/28/2017   Procedure: INSERTION PORT-A-CATH WITH ULTRASOUND;  Surgeon: Vanderbilt Ned, MD;  Location: [facility];  Service: General;  Laterality: Right;   REDUCTION MAMMAPLASTY Right    REMOVAL OF TISSUE EXPANDER AND PLACEMENT  OF IMPLANT Left 03/03/2019   Procedure: REMOVAL OF TISSUE EXPANDER AND PLACEMENT OF EXPANDER;  Surgeon: Lowery Estefana RAMAN, DO;  Location: [facility];  Service: Plastics;  Laterality: Left;   REMOVAL OF TISSUE EXPANDER AND PLACEMENT OF IMPLANT Left 05/25/2019   Procedure: LEFT BREAST REMOVAL OF TISSUE EXPANDER AND PLACEMENT OF IMPLANT;  Surgeon: Lowery Estefana RAMAN, DO;  Location: [facility];  Service: Plastics;  Laterality: Left;   TISSUE EXPANDER PLACEMENT Left 11/03/2018   Procedure: PLACEMENT OF  TISSUE EXPANDER LEFT BREAST;  Surgeon: Lowery Estefana RAMAN, DO;  Location: [facility];  Service: Plastics;  Laterality: Left;  Total case time is 3.5 hours   TUBAL LIGATION Bilateral 01/21/2011   Patient Active Problem List   Diagnosis Date Noted   SOBOE (shortness of breath on exertion) 11/11/2023   Primary osteoarthritis of left knee 10/09/2023   Achilles tendinitis, right leg 10/09/2023   Graves disease 07/16/2023   Multinodular goiter 07/16/2023   Right carpal tunnel syndrome 07/10/2023   Left carpal tunnel syndrome 07/10/2023   Pain of Right Knee 07/06/2023   History of breast reconstruction 05/15/2023   Pure hypercholesterolemia 03/30/2023   Acute meniscal tear, medial, right, initial encounter 11/12/2022   Plica syndrome of right knee 11/12/2022   Chondromalacia, right knee 11/12/2022   Depression 08/13/2022   BMI 40.0-44.9, adult (HCC) 08/13/2022   Obesity, Beginning BMI 08/13/2022   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   Boil of buttock, Left 12/24/2021   Hyperthyroidism 07/22/2021   Low serum thyroid  stimulating hormone (TSH) 06/12/2021   Other fatigue 05/23/2021   Hemoglobin low 05/23/2021   Polyphagia 05/16/2020   Vitamin D  deficiency 05/16/2020   At risk for osteoporosis 05/16/2020   Prediabetes 05/01/2020   Elevated blood pressure reading 05/01/2020   At risk for impaired metabolic function 05/01/2020   Breast asymmetry following reconstructive surgery 09/20/2019   Essential hypertension 08/17/2018   Class 3 severe obesity with serious comorbidity and body mass index (BMI) of 40.0 to 44.9 in adult [facility]) 09/17/2017   Acquired absence of left breast 09/15/2017   Malignant neoplasm of upper-inner quadrant of left breast in female, 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   Healthcare maintenance 03/25/2017   Malignant 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, estrogen receptor positive (HCC) 01/23/2017   Carcinoma of breast metastatic to  axillary lymph node, left (HCC) 01/23/2017    PCP: [name], MD  REFERRING PROVIDER: Cleatrice Ludie SAUNDERS, MD  THERAPY DIAG:  Left knee pain, unspecified chronicity  Chronic pain of right knee  Stiffness of right ankle, not elsewhere classified  Pain in right ankle and joints of right foot  REFERRING DIAG: Primary osteoarthritis of left knee [M17.12], Chondromalacia, right knee [M94.261], Right Achilles tendinitis [M76.61]   Rationale for Evaluation and Treatment:  Rehabilitation  SUBJECTIVE:  PERTINENT PAST HISTORY:  Hx of breast cancer, R knee pain, L knee pain      PRECAUTIONS: None  WEIGHT BEARING RESTRICTIONS No  FALLS:  Has patient fallen in last 6 months? No, Number of falls: 0  MOI/History of condition:  Onset date: January  SUBJECTIVE STATEMENT Pt reports that there are days that she feels ok and days her knees are quite painful.  She has been trying to walk more for exercise but this causes significant knee pain.   EVAL Chatara C Straley is a 53 y.o. female who presents to clinic with chief complaint of R posterior ankle pain.  Started in January  with no specific inciting event.  Has slowly worsened since that time.  The has used a boot as needed, she is using nitroglycerine patches, she ices.  She has some modest improvement but nothing significant.  Attempted eccentrics which were too painful.     Pain:  Are you having pain? Yes Pain location: R insertion of achilles tendon NPRS scale: 0/10 BEST: 2/10, Worst: 6/10 Aggravating factors: walking, driving, steps Relieving factors: rest, ice Pain description: sharp and aching  Are you having pain? 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 Moderate reactive joint effusion and small leaking Baker's cyst.   Heart size radial tear at the root of the medial meniscus with medial displacement of body. Mucoid degeneration in the posterior horn and body of the medial meniscus.  GENERAL OBSERVATION/GAIT:  Antalgic gait with reduced time in stance on R  PALPATION: TTP insertion of R achilles tendon  LE MMT:  MMT Right (Eval) Left (Eval) Right 01/20/24 Right 03/04/24 Right  03/18/24 R/L 10/16  Hip flexion (L2, L3)        Knee extension (L3) 4 4    4*/4*  Knee flexion 4 4    4*/4*  Hip abduction        Hip extension        Hip external rotation        Hip internal rotation        Hip adduction        Ankle dorsiflexion (L4) 5 5      Ankle plantarflexion (S1) Partial ROM SL eel raise unable  Unable  Partial ROM   Ankle inversion 4 5 5      Ankle eversion 4 5 5      Great Toe ext (L5)        Grossly         (Blank rows = not tested, score listed is out of 5 possible points OR may be listed in lbs of force.  N = WNL, D = diminished, C = clear for gross weakness with myotome testing, * = concordant pain with testing)  LE ROM:  ROM Right (Eval) Left (Eval) R/L 10/16  Hip flexion     Hip extension     Hip abduction     Hip adduction     Hip internal rotation     Hip external rotation     Knee extension   Lacking 3/0  Knee flexion   110/95  Ankle dorsiflexion 10 10   Ankle plantarflexion 50 50   Ankle inversion 35 35   Ankle eversion 20 20    (Blank rows = not tested, N = WNL, * = concordant pain with testing)  Functional Tests  Eval 01/22/24 9/26  Progressive balance screen (highest level completed for >/= 10''):  Feet together: 10'' Semi Tandem: R in rear 10'', L in rear 10'' Tandem: R in  rear 4'', L in rear 8'' SLS: R unable, L unable  SLS 28 sec right  SLS 41 sec Right  SLS 60 left  PATIENT SURVEYS:  LEFS: 33/80 LEFS: 59/80  TODAY'S TREATMENT:  [facility] Adult PT Treatment:                                                DATE: 04/07/24  Therapeutic Exercise: Elliptical - 5 min SLR Bridge on ball - 2x10 SAQ - 4# - 2x10 KT tape   Therapeutic Activity  STS - 2x5    [facility] Adult PT Treatment:                                                DATE: 03/24/24  Therapeutic Exercise: Quad set with towel SLR Updating HEP SAQ  RE-EVAL  [facility] Adult PT Treatment:                                                DATE: 03/18/24 Therapeutic Exercise: Heel raise x 10 Iso heel raise 30 se x 4  Eccentric heel raise x10 on step right Slant board heel raise Slant board stretch intermittently as needed  Seated Right heel raise 2 inch step and 25#KB added x 30  Neuromuscular re-ed: AIREX tandem  AIREX SLS trials     [facility] Adult PT Treatment:                                                DATE: 03/11/24 Therapeutic Exercise: Isometric heel raise 30 sec x 4  Wooden rocker board x 1 minute Slant board stretch x 2  Standing concentric heel raise to right eccentric lower x 10 each on 2 inch step Seated Right heel raise 2 inch step and 15# KB added x 30 Seated Soleus stretch with towel x2   Neuromuscular re-ed: SLS trails on foam oval 38 sec  Tandem on foam > 60 sec each             PATIENT EDUCATION ([place]/[facility]):  POC, diagnosis, prognosis, HEP, and outcome measures.  Pt educated via explanation, demonstration, and handout (HEP).  Pt confirms understanding verbally.   HOME EXERCISE PROGRAM: Access Code: [email or web address] URL: [email or web address] Date: 03/24/2024 Prepared by: Helene Gasmen  Program Notes toe Yoga  Exercises - Single Leg Stance  - 1 x daily - 7 x weekly - 1 sets - 4-5 reps - 30 to 60 seconds hold - Standing Eccentric Heel  Raise  - 1 x daily - 7 x weekly - 1 sets - 4-5 reps - 30'' hold - Supine Quad Set  - 3 x daily - 7 x weekly - 2 sets - 10 reps - 5 second hold - Active Straight Leg Raise with Quad Set  - 1 x daily - 7 x weekly - 2 sets - 10 reps   Eval 8/21 10/30      Progressive loading as tolerated Isometrics 30-45'' x4-5 Knee sleeve?       R LE/hip strength  ASSESSMENT:  CLINICAL IMPRESSION: Pt reports some minor benefit with tape last visit so trialed KT tape for ease of self application and will monitor response.  Working on more isolated knee strengthening which was tolerate well with some transient increase in pain.  Will progress as able.  Eval: Danisa is a 53 y.o. female who presents to clinic with signs and sxs consistent with R posterior heel pain.  Consistent with physician impression of insertional achilles tendinopathy.   She is using a heel lift and nitro patches.  Tried eccentrics but this was too much load and agging.  Started with band isometrics and will progress as able.   Jolayne will benefit from skilled PT to address relevant deficits and improve comfort in walking and recreational activities.   OBJECTIVE IMPAIRMENTS: Pain, ankle ROM, gait, balance  ACTIVITY LIMITATIONS: walking, standing, housework, stairs  PERSONAL FACTORS: See medical history and pertinent history   REHAB POTENTIAL: Good  CLINICAL DECISION MAKING: Evolving/moderate complexity  EVALUATION COMPLEXITY: Moderate   GOALS:   SHORT TERM GOALS: Target date: 01/30/2024   Nikiesha will be >75% HEP compliant to improve carryover between sessions and facilitate independent management of condition  Evaluation: ongoing Goal status: MET    LONG TERM GOALS: Target date: 02/27/2024 extended to 05/20/2024    Joette will self report >/= 50% decrease in pain from evaluation to improve function in daily tasks  Evaluation/Baseline: 10/10 max pain 02/12/24: 50% overall  improvement Goal status: MET    2.  Cletus will show a >/= 20 pt improvement in LEFS score (MCID is ~11% or 9 pts) as a proxy for functional improvement   Evaluation/Baseline: 33/80 pts 02/22/24: 59/80 Goal status: MET   3.  Sora will be able to complete yard work, including mowing her grass, not limited by pain  Evaluation/Baseline: limited 02/22/24: havent tried 03/04/24: did yard work Saturday, did not increase pain Goal status: ONGOING   4.  Hatley will be able to walk for 30 min, not limited by pain   Evaluation/Baseline: 10 min 02/22/24: on feet at mall for 3 hours  Goal status: MET    5.  Kalayna will be able to perform at least partial ROM SL heel raise on R, not limited by pain  Evaluation/Baseline: unable 03/04/24: unable  03/18/24: met x 1 Goal status: ONGOING            6.  Madgeline will improve the following MMTs to >/= 4+/5 with minimal pain to show improvement in strength:  knee ext and flexion   Evaluation/Baseline: see chart in note Goal status: NEW   7.  Hatley will be able to return to work with significant reduction in bil knee pain  Evaluation/Baseline: 10/10 pain by end of day Goal status: INITIAL    PLAN: PT FREQUENCY: 1-2x/week  PT DURATION: 8 weeks  PLANNED INTERVENTIONS:  [id number]- PT Re-evaluation, 97110-Therapeutic exercises, 97530- Therapeutic activity, [id number]- Neuromuscular re-education, 97535- Self Care, [id number]- Manual therapy, [id number]- Gait training, [id number]- Aquatic Therapy, [id number]- Electrical stimulation (manual), [id number]- Vasopneumatic device, [id number]- Traction (mechanical), [id number]- Ionotophoresis 4mg /ml Dexamethasone , Taping, Dry Needling, Joint manipulation, and Spinal manipulation.   Helene BRAVO Nieve Rojero PT 04/07/24 8:55 AM Phone: [phone] Fax: [phone]

## 2024-04-11 ENCOUNTER — Other Ambulatory Visit (HOSPITAL_COMMUNITY): Payer: Self-pay

## 2024-04-11 ENCOUNTER — Encounter: Payer: Self-pay | Admitting: Radiology

## 2024-04-11 DIAGNOSIS — R7301 Impaired fasting glucose: Secondary | ICD-10-CM | POA: Diagnosis not present

## 2024-04-11 DIAGNOSIS — I10 Essential (primary) hypertension: Secondary | ICD-10-CM | POA: Diagnosis not present

## 2024-04-11 DIAGNOSIS — E785 Hyperlipidemia, unspecified: Secondary | ICD-10-CM | POA: Diagnosis not present

## 2024-04-11 DIAGNOSIS — J4540 Moderate persistent asthma, uncomplicated: Secondary | ICD-10-CM | POA: Diagnosis not present

## 2024-04-11 MED ORDER — OMEPRAZOLE 40 MG PO CPDR
40.0000 mg | DELAYED_RELEASE_CAPSULE | ORAL | 1 refills | Status: AC
  Filled 2024-04-11 – 2024-04-14 (×3): qty 90, 90d supply, fill #0

## 2024-04-11 MED ORDER — GABAPENTIN 300 MG PO CAPS
300.0000 mg | ORAL_CAPSULE | Freq: Three times a day (TID) | ORAL | 0 refills | Status: DC
  Filled 2024-04-11: qty 270, 90d supply, fill #0

## 2024-04-12 ENCOUNTER — Other Ambulatory Visit (HOSPITAL_COMMUNITY): Payer: Self-pay

## 2024-04-13 ENCOUNTER — Other Ambulatory Visit (HOSPITAL_COMMUNITY): Payer: Self-pay

## 2024-04-13 ENCOUNTER — Other Ambulatory Visit: Payer: Self-pay

## 2024-04-14 ENCOUNTER — Ambulatory Visit: Attending: Family Medicine | Admitting: Physical Therapy

## 2024-04-14 ENCOUNTER — Encounter: Payer: Self-pay | Admitting: Physical Therapy

## 2024-04-14 ENCOUNTER — Other Ambulatory Visit (HOSPITAL_COMMUNITY): Payer: Self-pay

## 2024-04-14 DIAGNOSIS — M25562 Pain in left knee: Secondary | ICD-10-CM | POA: Diagnosis not present

## 2024-04-14 DIAGNOSIS — M25671 Stiffness of right ankle, not elsewhere classified: Secondary | ICD-10-CM | POA: Insufficient documentation

## 2024-04-14 DIAGNOSIS — M25571 Pain in right ankle and joints of right foot: Secondary | ICD-10-CM | POA: Diagnosis not present

## 2024-04-14 DIAGNOSIS — G8929 Other chronic pain: Secondary | ICD-10-CM | POA: Insufficient documentation

## 2024-04-14 DIAGNOSIS — M25561 Pain in right knee: Secondary | ICD-10-CM | POA: Diagnosis not present

## 2024-04-14 NOTE — Therapy (Signed)
 OUTPATIENT PHYSICAL THERAPY TREATMENT   Patient Name: Mackenzie [name] MRN: [id number] DOB:11/04/1970, 53 y.o., female Today's Date: 04/14/2024   PT End of Session - 04/14/24 0919     Visit Number 22    Date for Recertification  05/20/24    Authorization Type [facility]    PT Start Time 0902    PT Stop Time 0928    PT Time Calculation (min) 26 min            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, left (HCC) 01/23/2017   Cellulitis of right leg    Edema, lower extremity    GERD (gastroesophageal reflux disease)    Healthcare maintenance 03/25/2017   Herpesviral infection    Hot flashes 03/2017   Hyperlipidemia 03/20/2016   Impaired fasting glucose    Morbid obesity with body mass index (BMI) of 40.0 to 49.9 (HCC) 09/17/2017   Neoplasm of cervix    Osteoarthritis of knee    Other chest pain    Other specified disorders of veins    Personal history of chemotherapy    Personal history of radiation therapy    Polyneuropathy    Postmenopausal atrophic vaginitis    Pre-diabetes 07/26/2018   Hgb A1C elevated on 07/26/2018, Gestational Diabetes 2012   Pure hypercholesterolemia 03/30/2023   Seasonal allergies 2012   seasonal allergies causes allergic rhinitis and itchy, dry eyes per pt   Stress incontinence    Tachycardia    Thyrotoxicosis    Vitamin D  deficiency 05/2015   Past Surgical History:  Procedure Laterality Date   BREAST RECONSTRUCTION WITH PLACEMENT OF TISSUE EXPANDER AND FLEX HD (ACELLULAR HYDRATED DERMIS) Left 08/27/2017   Procedure: LEFT BREAST RECONSTRUCTION WITH PLACEMENT OF TISSUE EXPANDER AND FLEX HD;  Surgeon: Lowery Estefana RAMAN, DO;  Location: [facility];  Service:  Plastics;  Laterality: Left;   CESAREAN SECTION     x2   KNEE ARTHROSCOPY WITH MEDIAL MENISECTOMY Right 11/12/2022   Procedure: RIGHT KNEE ARTHROSCOPY, PARTIAL MEDIAL MENISCECTOMY;  Surgeon: Jerri Kay HERO, MD;  Location: [facility];  Service: Orthopedics;  Laterality: Right;   LATISSIMUS FLAP TO BREAST Left 11/03/2018   LATISSIMUS FLAP TO BREAST Left 11/03/2018   Procedure: LATISSIMUS FLAP TO LEFT BREAST;  Surgeon: Lowery Estefana RAMAN, DO;  Location: [facility];  Service: Plastics;  Laterality: Left;   MASTECTOMY MODIFIED RADICAL Left 08/27/2017   MASTECTOMY MODIFIED RADICAL Left 08/27/2017   Procedure: LEFT MODIFIED RADICAL MASTECTOMY;  Surgeon: Vanderbilt Ned, MD;  Location: [facility];  Service: General;  Laterality: Left;   MASTOPEXY Right 03/03/2019   Procedure: RIGHT BREAST MASTOPEXY/REDUCTION;  Surgeon: Lowery Estefana RAMAN, DO;  Location: [facility];  Service: Plastics;  Laterality: Right;  3 hours, please   PORT-A-CATH REMOVAL Right 08/27/2017   Procedure: REMOVAL PORT-A-CATH RIGHT CHEST;  Surgeon: Vanderbilt Ned, MD;  Location: [facility];  Service: General;  Laterality: Right;   PORTACATH PLACEMENT Right 01/28/2017   Procedure: INSERTION PORT-A-CATH WITH ULTRASOUND;  Surgeon: Vanderbilt Ned, MD;  Location: [facility];  Service: General;  Laterality: Right;   REDUCTION MAMMAPLASTY Right    REMOVAL OF TISSUE EXPANDER AND PLACEMENT  OF IMPLANT Left 03/03/2019   Procedure: REMOVAL OF TISSUE EXPANDER AND PLACEMENT OF EXPANDER;  Surgeon: Lowery Estefana RAMAN, DO;  Location: [facility];  Service: Plastics;  Laterality: Left;   REMOVAL OF TISSUE EXPANDER AND PLACEMENT OF IMPLANT Left 05/25/2019   Procedure: LEFT BREAST REMOVAL OF TISSUE EXPANDER AND PLACEMENT OF IMPLANT;  Surgeon: Lowery Estefana RAMAN, DO;  Location: [facility];  Service: Plastics;  Laterality: Left;   TISSUE EXPANDER PLACEMENT Left 11/03/2018   Procedure: PLACEMENT OF  TISSUE EXPANDER LEFT BREAST;  Surgeon: Lowery Estefana RAMAN, DO;  Location: [facility];  Service: Plastics;  Laterality: Left;  Total case time is 3.5 hours   TUBAL LIGATION Bilateral 01/21/2011   Patient Active Problem List   Diagnosis Date Noted   SOBOE (shortness of breath on exertion) 11/11/2023   Primary osteoarthritis of left knee 10/09/2023   Achilles tendinitis, right leg 10/09/2023   Graves disease 07/16/2023   Multinodular goiter 07/16/2023   Right carpal tunnel syndrome 07/10/2023   Left carpal tunnel syndrome 07/10/2023   Pain of Right Knee 07/06/2023   History of breast reconstruction 05/15/2023   Pure hypercholesterolemia 03/30/2023   Acute meniscal tear, medial, right, initial encounter 11/12/2022   Plica syndrome of right knee 11/12/2022   Chondromalacia, right knee 11/12/2022   Depression 08/13/2022   BMI 40.0-44.9, adult (HCC) 08/13/2022   Obesity, Beginning BMI 08/13/2022   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   Boil of buttock, Left 12/24/2021   Hyperthyroidism 07/22/2021   Low serum thyroid  stimulating hormone (TSH) 06/12/2021   Other fatigue 05/23/2021   Hemoglobin low 05/23/2021   Polyphagia 05/16/2020   Vitamin D  deficiency 05/16/2020   At risk for osteoporosis 05/16/2020   Prediabetes 05/01/2020   Elevated blood pressure reading 05/01/2020   At risk for impaired metabolic function 05/01/2020   Breast asymmetry following reconstructive surgery 09/20/2019   Essential hypertension 08/17/2018   Class 3 severe obesity with serious comorbidity and body mass index (BMI) of 40.0 to 44.9 in adult [facility]) 09/17/2017   Acquired absence of left breast 09/15/2017   Malignant neoplasm of upper-inner quadrant of left breast in female, 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   Healthcare maintenance 03/25/2017   Malignant 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, estrogen receptor positive (HCC) 01/23/2017   Carcinoma of breast metastatic to  axillary lymph node, left (HCC) 01/23/2017    PCP: [name], MD  REFERRING PROVIDER: Cleatrice Ludie SAUNDERS, MD  THERAPY DIAG:  Left knee pain, unspecified chronicity  Chronic pain of right knee  REFERRING DIAG: Primary osteoarthritis of left knee [M17.12], Chondromalacia, right knee [M94.261], Right Achilles tendinitis [M76.61]   Rationale for Evaluation and Treatment:  Rehabilitation  SUBJECTIVE:  PERTINENT PAST HISTORY:  Hx of breast cancer, R knee pain, L knee pain      PRECAUTIONS: None  WEIGHT BEARING RESTRICTIONS No  FALLS:  Has patient fallen in last 6 months? No, Number of falls: 0  MOI/History of condition:  Onset date: January  SUBJECTIVE STATEMENT Pt reports she has pain after bike , walking, or elliptical. Does water exercise 1 x per week.    EVAL Terence C Roskelley is a 53 y.o. female who presents to clinic with chief complaint of R posterior ankle pain.  Started in January with no specific inciting event.  Has slowly worsened since that time.  The has used a boot as needed, she is using nitroglycerine patches, she ices.  She has some modest  improvement but nothing significant.  Attempted eccentrics which were too painful.     Pain:  Are you having pain? Yes Pain location: R insertion of achilles tendon NPRS scale: 5-6/10 BEST: 2/10, Worst: 6/10 Aggravating factors: walking, driving, steps Relieving factors: rest, ice Pain description: sharp and aching  Are you having pain? 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 Moderate reactive joint effusion and small leaking Baker's cyst.   Heart size radial tear at the root of the medial meniscus with medial displacement of body. Mucoid degeneration in the posterior horn and body of the medial meniscus.  GENERAL OBSERVATION/GAIT:  Antalgic gait with reduced time in stance on R  PALPATION: TTP insertion of R achilles tendon  LE MMT:  MMT Right (Eval) Left (Eval) Right 01/20/24 Right 03/04/24 Right  03/18/24 R/L 10/16  Hip flexion (L2, L3)        Knee extension (L3) 4 4    4*/4*  Knee flexion 4 4    4*/4*  Hip abduction        Hip extension        Hip external rotation        Hip internal rotation        Hip adduction        Ankle dorsiflexion (L4) 5 5      Ankle plantarflexion (S1) Partial ROM SL eel raise unable  Unable  Partial ROM   Ankle inversion 4 5 5      Ankle eversion 4 5 5      Great Toe ext (L5)        Grossly         (Blank rows = not tested, score listed is out of 5 possible points OR may be listed in lbs of force.  N = WNL, D = diminished, C = clear for gross weakness with myotome testing, * = concordant pain with testing)  LE ROM:  ROM Right (Eval) Left (Eval) R/L 10/16  Hip flexion     Hip extension     Hip abduction     Hip adduction     Hip internal rotation     Hip external rotation     Knee extension   Lacking 3/0  Knee flexion   110/95  Ankle dorsiflexion 10 10   Ankle plantarflexion 50 50   Ankle inversion 35 35   Ankle eversion 20 20    (Blank rows = not tested, N = WNL, * = concordant pain with testing)  Functional Tests  Eval 01/22/24 9/26  Progressive balance screen (highest level completed for >/= 10''):  Feet together: 10'' Semi Tandem: R in rear 10'', L in rear 10'' Tandem: R in rear 4'', L in rear 8'' SLS: R unable, L unable  SLS 28 sec right  SLS 41 sec Right  SLS 60 left            PATIENT SURVEYS:  LEFS: 33/80 LEFS: 59/80  TODAY'S  TREATMENT: [facility] Adult PT Treatment:  DATE: 04/14/24 Therapeutic Exercise: Nustep L4 LE only x 5 minu Seated LAQ with ball squeeze SLR x 8 each  Hip abdct x 10 each   Manual Therapy: Bilat Mcconnel tape to patella- medial pull     [facility] Adult PT Treatment:                                                DATE: 04/07/24  Therapeutic Exercise: Elliptical - 5 min SLR Bridge on ball - 2x10 SAQ - 4# - 2x10 KT tape   Therapeutic Activity  STS - 2x5    [facility] Adult PT Treatment:                                                DATE: 03/24/24  Therapeutic Exercise: Quad set with towel SLR Updating HEP SAQ  RE-EVAL  [facility] Adult PT Treatment:                                                DATE: 03/18/24 Therapeutic Exercise: Heel raise x 10 Iso heel raise 30 se x 4  Eccentric heel raise x10 on step right Slant board heel raise Slant board stretch intermittently as needed  Seated Right heel raise 2 inch step and 25#KB added x 30  Neuromuscular re-ed: AIREX tandem  AIREX SLS trials     [facility] Adult PT Treatment:                                                DATE: 03/11/24 Therapeutic Exercise: Isometric heel raise 30 sec x 4  Wooden rocker board x 1 minute Slant board stretch x 2  Standing concentric heel raise to right eccentric lower x 10 each on 2 inch step Seated Right heel raise 2 inch step and 15# KB added x 30 Seated Soleus stretch with towel x2   Neuromuscular re-ed: SLS trails on foam oval 38 sec  Tandem on foam > 60 sec each             PATIENT EDUCATION ([place]/[facility]):  POC, diagnosis, prognosis, HEP, and outcome measures.  Pt educated via explanation, demonstration, and handout (HEP).  Pt confirms understanding verbally.   HOME EXERCISE PROGRAM: Access Code: [email or web address] URL: [email or web address] Date: 03/24/2024 Prepared by: Helene Gasmen  Program Notes toe Yoga  Exercises - Single  Leg Stance  - 1 x daily - 7 x weekly - 1 sets - 4-5 reps - 30 to 60 seconds hold - Standing Eccentric Heel Raise  - 1 x daily - 7 x weekly - 1 sets - 4-5 reps - 30'' hold - Supine Quad Set  - 3 x daily - 7 x weekly - 2 sets - 10 reps - 5 second hold - Active Straight Leg Raise with Quad Set  - 1 x daily - 7 x weekly - 2 sets - 10 reps   Eval 8/21 10/30 04/14/24     Progressive loading as tolerated Isometrics  30-45'' x4-5 Knee sleeve? Aquatics Nustep ?      R LE/hip strength                                 ASSESSMENT:  CLINICAL IMPRESSION: Pt arrived late so shorter session. She reports increased pain with all attempts at aerobic exercise that she has tried: elliptical, bike, walking. Trial of Nustep today with good tolerance. Suggested she try a session of aquatic therapy to get an HEP. She is currently attending a water aerobic class 1 x per week. She also reports some improvement when patella tracking tape is on her knees. She requested another instruction on how to apply the tape on he own. Rest of session used to perform her Quad and hip strengthening.   Eval: Towanna is a 53 y.o. female who presents to clinic with signs and sxs consistent with R posterior heel pain.  Consistent with physician impression of insertional achilles tendinopathy.   She is using a heel lift and nitro patches.  Tried eccentrics but this was too much load and agging.  Started with band isometrics and will progress as able.   Aleah will benefit from skilled PT to address relevant deficits and improve comfort in walking and recreational activities.   OBJECTIVE IMPAIRMENTS: Pain, ankle ROM, gait, balance  ACTIVITY LIMITATIONS: walking, standing, housework, stairs  PERSONAL FACTORS: See medical history and pertinent history   REHAB POTENTIAL: Good  CLINICAL DECISION MAKING: Evolving/moderate complexity  EVALUATION COMPLEXITY: Moderate   GOALS:   SHORT TERM GOALS: Target date: 01/30/2024   Zanaria  will be >75% HEP compliant to improve carryover between sessions and facilitate independent management of condition  Evaluation: ongoing Goal status: MET    LONG TERM GOALS: Target date: 02/27/2024 extended to 05/20/2024    [name] will self report >/= 50% decrease in pain from evaluation to improve function in daily tasks  Evaluation/Baseline: 10/10 max pain 02/12/24: 50% overall improvement Goal status: MET    2.  Beryle will show a >/= 20 pt improvement in LEFS score (MCID is ~11% or 9 pts) as a proxy for functional improvement   Evaluation/Baseline: 33/80 pts 02/22/24: 59/80 Goal status: MET   3.  Wynetta will be able to complete yard work, including mowing her grass, not limited by pain  Evaluation/Baseline: limited 02/22/24: havent tried 03/04/24: did yard work Saturday, did not increase pain Goal status: ONGOING   4.  Lendora will be able to walk for 30 min, not limited by pain   Evaluation/Baseline: 10 min 02/22/24: on feet at mall for 3 hours  Goal status: MET    5.  Ethelyne will be able to perform at least partial ROM SL heel raise on R, not limited by pain  Evaluation/Baseline: unable 03/04/24: unable  03/18/24: met x 1 Goal status: ONGOING            6.  Kristianne will improve the following MMTs to >/= 4+/5 with minimal pain to show improvement in strength:  knee ext and flexion   Evaluation/Baseline: see chart in note Goal status: NEW   7.  Jaedan will be able to return to work with significant reduction in bil knee pain  Evaluation/Baseline: 10/10 pain by end of day Goal status: INITIAL    PLAN: PT FREQUENCY: 1-2x/week  PT DURATION: 8 weeks  PLANNED INTERVENTIONS:  97164- PT Re-evaluation, 97110-Therapeutic exercises, 97530- Therapeutic activity, [id number]- Neuromuscular re-education, 97535- Self Care, 02859-  Manual therapy, [id number]- Gait training, 02886- Aquatic Therapy, [id number]- Electrical stimulation (manual), [id number]- Vasopneumatic device, [id number]- Traction  (mechanical), [id number]- Ionotophoresis 4mg /ml Dexamethasone , Taping, Dry Needling, Joint manipulation, and Spinal manipulation.   Harlene CHRISTELLA Persons PTA 04/14/24 11:57 AM Phone: [phone] Fax: [phone]

## 2024-04-14 NOTE — Patient Instructions (Signed)
 Aquatic Therapy at [facility]-  What to Expect!  Where:   [facility] [address] [place], [place] [address] Rehab phone [phone]  NOTE:  You will receive an automated phone message reminding you of your appt and it will say the appointment is at the [address] [facility] clinic.          How to Prepare: Please make sure you drink 8 ounces of water about one hour prior to your pool session A caregiver may attend if needed with the patient to help assist as needed. A caregiver can sit in the pool room on chair. Please arrive IN YOUR SUIT and 15 minutes prior to your appointment - this helps to avoid delays in starting your session. Please make sure to attend to any toileting needs prior to entering the pool [place] rooms for changing are provided.   There is direct access to the pool deck form the locker room.  You can lock your belongings in a locker with lock provided. Once on the pool deck your therapist will ask if you have signed the Patient  Consent and Assignment of Benefits form before beginning treatment Your therapist may take your blood pressure prior to, during and after your session if indicated We usually try and create a home exercise program based on activities we do in the pool.  Please be thinking about who might be able to assist you in the pool should you need to participate in an aquatic home exercise program at the time of discharge if you need assistance.  Some patients do not want to or do not have the ability to participate in an aquatic home program - this is not a barrier in any way to you participating in aquatic therapy as part of your current therapy plan! After Discharge from PT, you can continue using home program at  the [facility], there is a drop-in fee for $5 ($45 a month)or for 60 years  or older $4.00 ($40 a month for seniors ) or any local [employer] pool.  Memberships for purchase are  available for gym/pool at [facility]  IT IS VERY IMPORTANT THAT YOUR LAST VISIT BE IN THE CLINIC AT [facility] [address] AFTER YOUR LAST AQUATIC VISIT.  PLEASE MAKE SURE THAT YOU HAVE A [address]  APPOINTMENT SCHEDULED.   About the pool: Pool is located approximately 500 FT from the entrance of the building.  Please bring a support person if you need assistance traveling this      distance.   Your therapist will assist you in entering the water; there are two ways to           enter: stairs with railings, and a mechanical lift. Your therapist will determine the most appropriate way for you.  Water temperature is usually between 88-90 degrees  There may be up to 2 other swimmers in the pool at the same time  The pool deck is tile, please wear shoes with good traction if you prefer not to be barefoot.    Contact Info:  For appointment scheduling and cancellations:         Please call the [facility]  PH:[phone]              Aquatic Therapy  Outpatient Rehabilitation @ [facility]       All sessions are 45 minutes

## 2024-04-20 ENCOUNTER — Ambulatory Visit: Admitting: Physical Therapy

## 2024-04-20 DIAGNOSIS — M25571 Pain in right ankle and joints of right foot: Secondary | ICD-10-CM | POA: Diagnosis not present

## 2024-04-20 DIAGNOSIS — M25562 Pain in left knee: Secondary | ICD-10-CM

## 2024-04-20 DIAGNOSIS — M25561 Pain in right knee: Secondary | ICD-10-CM | POA: Diagnosis not present

## 2024-04-20 DIAGNOSIS — M25671 Stiffness of right ankle, not elsewhere classified: Secondary | ICD-10-CM | POA: Diagnosis not present

## 2024-04-20 DIAGNOSIS — G8929 Other chronic pain: Secondary | ICD-10-CM

## 2024-04-21 ENCOUNTER — Encounter: Payer: Self-pay | Admitting: Physical Therapy

## 2024-04-21 ENCOUNTER — Ambulatory Visit: Admitting: Physical Therapy

## 2024-04-21 NOTE — Therapy (Signed)
 OUTPATIENT PHYSICAL THERAPY TREATMENT   Patient Name: Mackenzie [name] MRN: [id number] DOB:Jan 08, 1971, 53 y.o., female Today's Date: 04/21/2024   PT End of Session - 04/21/24 0721     Visit Number 23    Date for Recertification  05/20/24    Authorization Type [employer] [facility]    PT Start Time 1552    PT Stop Time 1630    PT Time Calculation (min) 38 min             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, left (HCC) 01/23/2017   Cellulitis of right leg    Edema, lower extremity    GERD (gastroesophageal reflux disease)    Healthcare maintenance 03/25/2017   Herpesviral infection    Hot flashes 03/2017   Hyperlipidemia 03/20/2016   Impaired fasting glucose    Morbid obesity with body mass index (BMI) of 40.0 to 49.9 (HCC) 09/17/2017   Neoplasm of cervix    Osteoarthritis of knee    Other chest pain    Other specified disorders of veins    Personal history of chemotherapy    Personal history of radiation therapy    Polyneuropathy    Postmenopausal atrophic vaginitis    Pre-diabetes 07/26/2018   Hgb A1C elevated on 07/26/2018, Gestational Diabetes 2012   Pure hypercholesterolemia 03/30/2023   Seasonal allergies 2012   seasonal allergies causes allergic rhinitis and itchy, dry eyes per pt   Stress incontinence    Tachycardia    Thyrotoxicosis    Vitamin D  deficiency 05/2015   Past Surgical History:  Procedure Laterality Date   BREAST RECONSTRUCTION WITH PLACEMENT OF TISSUE EXPANDER AND FLEX HD (ACELLULAR HYDRATED DERMIS) Left 08/27/2017   Procedure: LEFT BREAST RECONSTRUCTION WITH PLACEMENT OF TISSUE EXPANDER AND FLEX HD;  Surgeon: Lowery Estefana RAMAN, DO;  Location: [facility];  Service:  Plastics;  Laterality: Left;   CESAREAN SECTION     x2   KNEE ARTHROSCOPY WITH MEDIAL MENISECTOMY Right 11/12/2022   Procedure: RIGHT KNEE ARTHROSCOPY, PARTIAL MEDIAL MENISCECTOMY;  Surgeon: Jerri Kay HERO, MD;  Location: [facility];  Service: Orthopedics;  Laterality: Right;   LATISSIMUS FLAP TO BREAST Left 11/03/2018   LATISSIMUS FLAP TO BREAST Left 11/03/2018   Procedure: LATISSIMUS FLAP TO LEFT BREAST;  Surgeon: Lowery Estefana RAMAN, DO;  Location: [facility];  Service: Plastics;  Laterality: Left;   MASTECTOMY MODIFIED RADICAL Left 08/27/2017   MASTECTOMY MODIFIED RADICAL Left 08/27/2017   Procedure: LEFT MODIFIED RADICAL MASTECTOMY;  Surgeon: Vanderbilt Ned, MD;  Location: [facility];  Service: General;  Laterality: Left;   MASTOPEXY Right 03/03/2019   Procedure: RIGHT BREAST MASTOPEXY/REDUCTION;  Surgeon: Lowery Estefana RAMAN, DO;  Location: [facility];  Service: Plastics;  Laterality: Right;  3 hours, please   PORT-A-CATH REMOVAL Right 08/27/2017   Procedure: REMOVAL PORT-A-CATH RIGHT CHEST;  Surgeon: Vanderbilt Ned, MD;  Location: [facility];  Service: General;  Laterality: Right;   PORTACATH PLACEMENT Right 01/28/2017   Procedure: INSERTION PORT-A-CATH WITH ULTRASOUND;  Surgeon: Vanderbilt Ned, MD;  Location: [facility];  Service: General;  Laterality: Right;   REDUCTION MAMMAPLASTY Right    REMOVAL OF TISSUE EXPANDER AND  PLACEMENT OF IMPLANT Left 03/03/2019   Procedure: REMOVAL OF TISSUE EXPANDER AND PLACEMENT OF EXPANDER;  Surgeon: Lowery Estefana RAMAN, DO;  Location: [facility];  Service: Plastics;  Laterality: Left;   REMOVAL OF TISSUE EXPANDER AND PLACEMENT OF IMPLANT Left 05/25/2019   Procedure: LEFT BREAST REMOVAL OF TISSUE EXPANDER AND PLACEMENT OF IMPLANT;  Surgeon: Lowery Estefana RAMAN, DO;  Location: [facility];  Service: Plastics;  Laterality: Left;   TISSUE EXPANDER PLACEMENT Left 11/03/2018   Procedure: PLACEMENT OF  TISSUE EXPANDER LEFT BREAST;  Surgeon: Lowery Estefana RAMAN, DO;  Location: [facility];  Service: Plastics;  Laterality: Left;  Total case time is 3.5 hours   TUBAL LIGATION Bilateral 01/21/2011   Patient Active Problem List   Diagnosis Date Noted   SOBOE (shortness of breath on exertion) 11/11/2023   Primary osteoarthritis of left knee 10/09/2023   Achilles tendinitis, right leg 10/09/2023   Graves disease 07/16/2023   Multinodular goiter 07/16/2023   Right carpal tunnel syndrome 07/10/2023   Left carpal tunnel syndrome 07/10/2023   Pain of Right Knee 07/06/2023   History of breast reconstruction 05/15/2023   Pure hypercholesterolemia 03/30/2023   Acute meniscal tear, medial, right, initial encounter 11/12/2022   Plica syndrome of right knee 11/12/2022   Chondromalacia, right knee 11/12/2022   Depression 08/13/2022   BMI 40.0-44.9, adult (HCC) 08/13/2022   Obesity, Beginning BMI 08/13/2022   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   Boil of buttock, Left 12/24/2021   Hyperthyroidism 07/22/2021   Low serum thyroid  stimulating hormone (TSH) 06/12/2021   Other fatigue 05/23/2021   Hemoglobin low 05/23/2021   Polyphagia 05/16/2020   Vitamin D  deficiency 05/16/2020   At risk for osteoporosis 05/16/2020   Prediabetes 05/01/2020   Elevated blood pressure reading 05/01/2020   At risk for impaired metabolic function 05/01/2020   Breast asymmetry following reconstructive surgery 09/20/2019   Essential hypertension 08/17/2018   Class 3 severe obesity with serious comorbidity and body mass index (BMI) of 40.0 to 44.9 in adult [facility]) 09/17/2017   Acquired absence of left breast 09/15/2017   Malignant neoplasm of upper-inner quadrant of left breast in female, 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   Healthcare maintenance 03/25/2017   Malignant 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, estrogen receptor positive (HCC) 01/23/2017   Carcinoma of breast metastatic to  axillary lymph node, left (HCC) 01/23/2017    PCP: [name], MD  REFERRING PROVIDER: Cleatrice Ludie SAUNDERS, MD  THERAPY DIAG:  Left knee pain, unspecified chronicity  Stiffness of right ankle, not elsewhere classified  Chronic pain of right knee  Pain in right ankle and joints of right foot  REFERRING DIAG: Primary osteoarthritis of left knee [M17.12], Chondromalacia, right knee [M94.261], Right Achilles tendinitis [M76.61]   Rationale for Evaluation and Treatment:  Rehabilitation  SUBJECTIVE:  PERTINENT PAST HISTORY:  Hx of breast cancer, R knee pain, L knee pain      PRECAUTIONS: None  WEIGHT BEARING RESTRICTIONS No  FALLS:  Has patient fallen in last 6 months? No, Number of falls: 0  MOI/History of condition:  Onset date: January  SUBJECTIVE STATEMENT Pt reports continued improvement in knees and ankles.   EVAL Jae C Pinkney is a 53 y.o. female who presents to clinic with chief complaint of R posterior ankle pain.  Started in January with no specific inciting event.  Has slowly worsened since that time.  The has used a boot as needed, she is using nitroglycerine  patches, she ices.  She has some modest improvement but nothing significant.  Attempted eccentrics which were too painful.     Pain:  Are you having pain? Yes Pain location: R insertion of achilles tendon NPRS scale: 5-6/10 BEST: 2/10, Worst: 6/10 Aggravating factors: walking, driving, steps Relieving factors: rest, ice Pain description: sharp and aching  Are you having pain? 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 Moderate reactive joint effusion and small leaking Baker's cyst.   Heart size radial tear at the root of the medial meniscus with medial displacement of body. Mucoid degeneration in the posterior horn and body of the medial meniscus.  GENERAL OBSERVATION/GAIT:  Antalgic gait with reduced time in stance on R  PALPATION: TTP insertion of R achilles tendon  LE MMT:  MMT Right (Eval) Left (Eval) Right 01/20/24 Right 03/04/24 Right  03/18/24 R/L 10/16  Hip flexion (L2, L3)        Knee extension (L3) 4 4    4*/4*  Knee flexion 4 4    4*/4*  Hip abduction        Hip extension        Hip external rotation        Hip internal rotation        Hip adduction        Ankle dorsiflexion (L4) 5 5      Ankle plantarflexion (S1) Partial ROM SL eel raise unable  Unable  Partial ROM   Ankle inversion 4 5 5      Ankle eversion 4 5 5      Great Toe ext (L5)        Grossly         (Blank rows = not tested, score listed is out of 5 possible points OR may be listed in lbs of force.  N = WNL, D = diminished, C = clear for gross weakness with myotome testing, * = concordant pain with testing)  LE ROM:  ROM Right (Eval) Left (Eval) R/L 10/16  Hip flexion     Hip extension     Hip abduction     Hip adduction     Hip internal rotation     Hip external rotation     Knee extension   Lacking 3/0  Knee flexion   110/95  Ankle dorsiflexion 10 10   Ankle plantarflexion 50 50   Ankle inversion 35 35   Ankle eversion 20 20    (Blank rows = not tested, N = WNL, * = concordant pain with testing)  Functional Tests  Eval 01/22/24 9/26  Progressive balance screen (highest level completed for >/= 10''):  Feet together: 10'' Semi Tandem: R in rear 10'', L in rear 10'' Tandem: R in rear 4'', L in rear 8'' SLS: R unable, L unable  SLS 28 sec right  SLS 41 sec Right  SLS 60 left             PATIENT SURVEYS:  LEFS: 33/80 LEFS: 59/80  TODAY'S TREATMENT:  TREATMENT 04/21/24:  Aquatic therapy at [facility]- [address] -  therapeutic pool temp 90-92 degrees   Aquatic Therapy:  Water walking for warm up fwd/lat/bkwds  Standing hip abd SL heel raise Knee flexion stretch HS stretch Step up fwd and lat STS to step from bench Noodle stomp  Pt requires the buoyancy of water for active assisted exercises with buoyancy supported for strengthening and AROM exercises. Hydrostatic pressure also supports joints by unweighting joint load by at least 50 % in 3-4 feet depth water. 80% in chest to neck deep water. Water will provide assistance with movement using the current and laminar flow while the buoyancy reduces weight bearing. Pt requires the viscosity of the water for resistance with strengthening exercises.   [facility] Adult PT Treatment:                                                DATE: 04/14/24 Therapeutic Exercise: Nustep L4 LE only x 5 minu Seated LAQ with ball squeeze SLR x 8 each  Hip abdct x 10 each   Manual Therapy: Bilat Mcconnel tape to patella- medial pull     [facility] Adult PT Treatment:                                                DATE: 04/07/24  Therapeutic Exercise: Elliptical - 5 min SLR Bridge on ball - 2x10 SAQ - 4# - 2x10 KT tape   Therapeutic Activity  STS - 2x5    [facility] Adult PT Treatment:                                                DATE: 03/24/24  Therapeutic Exercise: Quad set with towel SLR Updating HEP SAQ  RE-EVAL  [facility] Adult PT Treatment:                                                DATE: 03/18/24 Therapeutic Exercise: Heel raise x 10 Iso heel raise 30 se x 4  Eccentric heel raise x10 on step right Slant board heel raise Slant board stretch intermittently as needed  Seated Right heel raise 2 inch step and 25#KB added x 30  Neuromuscular re-ed: AIREX tandem  AIREX SLS trials     [facility] Adult PT  Treatment:                                                DATE: 03/11/24 Therapeutic Exercise: Isometric heel raise 30 sec x 4  Wooden rocker board x 1 minute Slant board stretch x 2  Standing concentric heel raise to right eccentric lower x 10 each on 2 inch step Seated Right heel raise 2 inch step and 15# KB added x 30 Seated Soleus stretch with towel x2   Neuromuscular re-ed: SLS trails on foam oval 38 sec  Tandem on foam > 60 sec each  PATIENT EDUCATION ([place]/HM):  POC, diagnosis, prognosis, HEP, and outcome measures.  Pt educated via explanation, demonstration, and handout (HEP).  Pt confirms understanding verbally.   HOME EXERCISE PROGRAM: Access Code: [email or web address] URL: [email or web address] Date: 03/24/2024 Prepared by: Helene Gasmen  Program Notes toe Yoga  Exercises - Single Leg Stance  - 1 x daily - 7 x weekly - 1 sets - 4-5 reps - 30 to 60 seconds hold - Standing Eccentric Heel Raise  - 1 x daily - 7 x weekly - 1 sets - 4-5 reps - 30'' hold - Supine Quad Set  - 3 x daily - 7 x weekly - 2 sets - 10 reps - 5 second hold - Active Straight Leg Raise with Quad Set  - 1 x daily - 7 x weekly - 2 sets - 10 reps   Eval 8/21 10/30 04/14/24     Progressive loading as tolerated Isometrics 30-45'' x4-5 Knee sleeve? Aquatics Nustep ?      R LE/hip strength                                 ASSESSMENT:  CLINICAL IMPRESSION: Session today focused on knee mobility and strengthening in the aquatic environment for use of buoyancy to offload joints and the viscosity of water as resistance during therapeutic exercise.  Pt with improved gait mechanics and comfort aquatics vs land.  Pt with noted fatigue with glute heavy exercises.  Patient was able to tolerate all prescribed exercises in the aquatic environment with no adverse effects and reports no increase in pain at the end of the session. Patient continues to benefit from skilled PT services on land  and aquatic based and should be progressed as able to improve functional independence.   Eval: Zailey is a 53 y.o. female who presents to clinic with signs and sxs consistent with R posterior heel pain.  Consistent with physician impression of insertional achilles tendinopathy.   She is using a heel lift and nitro patches.  Tried eccentrics but this was too much load and agging.  Started with band isometrics and will progress as able.   Shaivi will benefit from skilled PT to address relevant deficits and improve comfort in walking and recreational activities.   OBJECTIVE IMPAIRMENTS: Pain, ankle ROM, gait, balance  ACTIVITY LIMITATIONS: walking, standing, housework, stairs  PERSONAL FACTORS: See medical history and pertinent history   REHAB POTENTIAL: Good  CLINICAL DECISION MAKING: Evolving/moderate complexity  EVALUATION COMPLEXITY: Moderate   GOALS:   SHORT TERM GOALS: Target date: 01/30/2024   Tamkia will be >75% HEP compliant to improve carryover between sessions and facilitate independent management of condition  Evaluation: ongoing Goal status: MET    LONG TERM GOALS: Target date: 02/27/2024 extended to 05/20/2024    Taron will self report >/= 50% decrease in pain from evaluation to improve function in daily tasks  Evaluation/Baseline: 10/10 max pain 02/12/24: 50% overall improvement Goal status: MET    2.  Anye will show a >/= 20 pt improvement in LEFS score (MCID is ~11% or 9 pts) as a proxy for functional improvement   Evaluation/Baseline: 33/80 pts 02/22/24: 59/80 Goal status: MET   3.  Vidhi will be able to complete yard work, including mowing her grass, not limited by pain  Evaluation/Baseline: limited 02/22/24: havent tried 03/04/24: did yard work Saturday, did not increase pain Goal status: ONGOING   4.  Belma will be able to walk  for 30 min, not limited by pain   Evaluation/Baseline: 10 min 02/22/24: on feet at mall for 3 hours  Goal  status: MET    5.  Inioluwa will be able to perform at least partial ROM SL heel raise on R, not limited by pain  Evaluation/Baseline: unable 03/04/24: unable  03/18/24: met x 1 Goal status: ONGOING            6.  Jessey will improve the following MMTs to >/= 4+/5 with minimal pain to show improvement in strength:  knee ext and flexion   Evaluation/Baseline: see chart in note Goal status: NEW   7.  Makesha will be able to return to work with significant reduction in bil knee pain  Evaluation/Baseline: 10/10 pain by end of day Goal status: INITIAL    PLAN: PT FREQUENCY: 1-2x/week  PT DURATION: 8 weeks  PLANNED INTERVENTIONS:  97164- PT Re-evaluation, 97110-Therapeutic exercises, 97530- Therapeutic activity, [id number]- Neuromuscular re-education, 97535- Self Care, [id number]- Manual therapy, [id number]- Gait training, [id number]- Aquatic Therapy, [id number]- Electrical stimulation (manual), [id number]- Vasopneumatic device, [id number]- Traction (mechanical), [id number]- Ionotophoresis 4mg /ml Dexamethasone , Taping, Dry Needling, Joint manipulation, and Spinal manipulation.   Helene BRAVO Audryana Hockenberry PT 04/21/24 9:58 AM Phone: [phone] Fax: [phone]

## 2024-04-22 ENCOUNTER — Other Ambulatory Visit (HOSPITAL_COMMUNITY): Payer: Self-pay

## 2024-04-22 MED ORDER — PEG 3350-KCL-NA BICARB-NACL 420 G PO SOLR
ORAL | 0 refills | Status: DC
  Filled 2024-04-22: qty 4000, 1d supply, fill #0

## 2024-04-22 MED ORDER — BISACODYL 5 MG PO TBEC
5.0000 mg | DELAYED_RELEASE_TABLET | ORAL | 0 refills | Status: DC
  Filled 2024-04-22: qty 4, 1d supply, fill #0

## 2024-04-27 ENCOUNTER — Ambulatory Visit (INDEPENDENT_AMBULATORY_CARE_PROVIDER_SITE_OTHER): Admitting: Family Medicine

## 2024-04-27 VITALS — BP 126/65 | Ht 65.0 in | Wt 271.0 lb

## 2024-04-27 DIAGNOSIS — G8929 Other chronic pain: Secondary | ICD-10-CM | POA: Diagnosis not present

## 2024-04-27 DIAGNOSIS — M25561 Pain in right knee: Secondary | ICD-10-CM

## 2024-04-27 DIAGNOSIS — M1712 Unilateral primary osteoarthritis, left knee: Secondary | ICD-10-CM

## 2024-04-27 NOTE — Patient Instructions (Signed)
 Your knee pain is due to arthritis. These are the different medications you can take for this: Tylenol  500mg  1-2 tabs three times a day for pain. Voltaren  gel, capsaicin, aspercreme, or biofreeze topically up to four times a day may also help with pain. Some supplements that may help for arthritis: Boswellia extract, curcumin, pycnogenol Aleve  1-2 tabs twice a day with food as needed (if you want to do the diclofenac  tablets as needed let me know and we can send this in again). Cortisone injections are an option. If cortisone injections do not help, there are different types of shots (gel injections) that may help but they take longer to take effect. You've tried both of these types of injections before without much success though. It's important that you continue to stay active. Wean to home exercise program when you feel comfortable. Do home exercises 3-4 times a week. Shoe inserts with good arch support may be helpful. Heat or ice 15 minutes at a time 3-4 times a day as needed to help with pain. Water aerobics and cycling with low resistance are the best two types of exercise for arthritis though any exercise is ok as long as it doesn't worsen the pain. Follow up with me as needed.

## 2024-04-27 NOTE — Progress Notes (Signed)
 PCP: [name], MD  Patient is a 53 y.o. female here for follow up of bilateral knee pain and right achilles tendinopathy.  HPI At last appt 10/8 symptoms were improving somewhat, planned to continue PT and supportive therapies including tylenol , topical therapies.  Today she tells me Achilles is feeling better.  Her knees continue to bother her, but she feels it is at a manageable level of discomfort and something she will have to live with. She has had steroid injections before and had gel injection in both knees >6 months ago but feels that it did not change much. She is not interested in further injections at this time.  She is walking daily and attending PT; this does seem to help. Going down stairs/inclines does give her more trouble. Tylenol , voltaren  gel helps some.  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, left (HCC) 01/23/2017   Cellulitis of right leg    Edema, lower extremity    GERD (gastroesophageal reflux disease)    Healthcare maintenance 03/25/2017   Herpesviral infection    Hot flashes 03/2017   Hyperlipidemia 03/20/2016   Impaired fasting glucose    Morbid obesity with body mass index (BMI) of 40.0 to 49.9 (HCC) 09/17/2017   Neoplasm of cervix    Osteoarthritis of knee    Other chest pain    Other specified disorders of veins    Personal history of chemotherapy    Personal history of radiation therapy    Polyneuropathy    Postmenopausal atrophic vaginitis    Pre-diabetes 07/26/2018   Hgb A1C elevated on 07/26/2018, Gestational Diabetes 2012   Pure hypercholesterolemia 03/30/2023   Seasonal allergies 2012   seasonal allergies causes allergic rhinitis and itchy, dry eyes per  pt   Stress incontinence    Tachycardia    Thyrotoxicosis    Vitamin D  deficiency 05/2015    Current Outpatient Medications on File Prior to Visit  Medication Sig Dispense Refill   acetaminophen  (TYLENOL ) 500 MG tablet Take 1,000 mg by mouth every 6 (six) hours as needed (for pain/headaches.).      Ascorbic Acid (VITAMIN C PO) Take by mouth daily.     bisacodyl  (DULCOLAX) 5 MG EC tablet Take as directed. 4 tablet 0   buPROPion  (WELLBUTRIN  SR) 200 MG 12 hr tablet Take 1 tablet (200 mg total) by mouth daily. 90 tablet 0   Calcium -Phosphorus-Vitamin D  (VITAMIN D3/CALCIUM /PHOSPHORUS PO) Take 600 mg by mouth daily.     cetirizine (ZYRTEC) 10 MG tablet Take 10 mg by mouth daily.     cyclobenzaprine  (FLEXERIL ) 10 MG tablet Take 1/2 tablet (5 mg total) by mouth 2 (two) times daily as needed. 90 tablet 0   diclofenac  (VOLTAREN ) 75 MG EC tablet Take 1 tablet (75 mg total) by mouth 2 (two) times daily as needed. 60 tablet 0   gabapentin  (NEURONTIN ) 300 MG capsule Take 1 capsule (300 mg total) by mouth 3 (three) times daily. 270 capsule 0   gabapentin  (NEURONTIN ) 300 MG capsule Take 1 capsule (300 mg total) by mouth 3 (three) times daily. 270 capsule 0   letrozole  (FEMARA ) 2.5 MG tablet Take 1 tablet (2.5 mg total) by mouth daily.  90 tablet 4   loratadine  (CLARITIN ) 10 MG tablet Take 1 tablet (10 mg total) by mouth daily. (Patient taking differently: Take 10 mg by mouth daily as needed for allergies.) 90 tablet 2   losartan  (COZAAR ) 50 MG tablet Take 1 tablet (50 mg total) by mouth daily. 90 tablet 0   metFORMIN  (GLUCOPHAGE -XR) 500 MG 24 hr tablet Take 1 tablet (500 mg total) by mouth 2 (two) times daily with a meal. 180 tablet 0   methimazole  (TAPAZOLE ) 5 MG tablet Take 1 tablet (5 mg total) by mouth daily in the afternoon. 90 tablet 3   metoprolol  succinate (TOPROL -XL) 50 MG 24 hr tablet Take 1/2 tablet (25 mg total) by mouth daily. 90 tablet 0   montelukast  (SINGULAIR ) 10 MG tablet Take 1 tablet (10  mg total) by mouth at bedtime. 90 tablet 0   Multiple Vitamin (MULTIVITAMIN WITH MINERALS) TABS tablet Take 1 tablet by mouth daily.      nitroGLYCERIN  (NITRODUR - DOSED IN MG/24 HR) 0.2 mg/hr patch Apply 1/4th patch to affected achilles, change daily 30 patch 1   Omega-3 Fatty Acids (FISH OIL) 1000 MG CAPS Take 1,000 mg by mouth 2 (two) times daily.      omeprazole  (PRILOSEC) 40 MG capsule Take 1 capsule by mouth daily 1/2 to 1 hour before morning meal 90 capsule 1   ondansetron  (ZOFRAN ) 4 MG tablet Take 1 tablet (4 mg total) by mouth every 8 (eight) hours as needed for nausea or vomiting. 20 tablet 0   polyethylene glycol-electrolytes (NULYTELY ) 420 g solution Take as directed. 4000 mL 0   Probiotic Product (PROBIOTIC DAILY PO) Take 1 capsule by mouth 2 (two) times daily.      rosuvastatin  (CRESTOR ) 20 MG tablet Take 1 tablet (20 mg total) by mouth daily. 90 tablet 0   spironolactone  (ALDACTONE ) 25 MG tablet Take 1 tablet (25 mg total) by mouth daily. 90 tablet 0   traMADol  (ULTRAM ) 50 MG tablet Take 1-2 tablets (50-100 mg total) by mouth daily as needed. 30 tablet 1   ZINC OXIDE PO Take 1 tablet by mouth 2 (two) times a day.     Current Facility-Administered Medications on File Prior to Visit  Medication Dose Route Frequency Provider Last Rate Last Admin   sodium chloride  flush (NS) 0.9 % injection 10 mL  10 mL Intravenous PRN Magrinat, Gustav C, MD   10 mL at 03/10/17 1239    Past Surgical History:  Procedure Laterality Date   BREAST RECONSTRUCTION WITH PLACEMENT OF TISSUE EXPANDER AND FLEX HD (ACELLULAR HYDRATED DERMIS) Left 08/27/2017   Procedure: LEFT BREAST RECONSTRUCTION WITH PLACEMENT OF TISSUE EXPANDER AND FLEX HD;  Surgeon: Lowery Estefana RAMAN, DO;  Location: [facility];  Service: Plastics;  Laterality: Left;   CESAREAN SECTION     x2   KNEE ARTHROSCOPY WITH MEDIAL MENISECTOMY Right 11/12/2022   Procedure: RIGHT KNEE ARTHROSCOPY, PARTIAL MEDIAL MENISCECTOMY;  Surgeon: Jerri Kay HERO, MD;   Location: [facility];  Service: Orthopedics;  Laterality: Right;   LATISSIMUS FLAP TO BREAST Left 11/03/2018   LATISSIMUS FLAP TO BREAST Left 11/03/2018   Procedure: LATISSIMUS FLAP TO LEFT BREAST;  Surgeon: Lowery Estefana RAMAN, DO;  Location: [facility];  Service: Plastics;  Laterality: Left;   MASTECTOMY MODIFIED RADICAL Left 08/27/2017   MASTECTOMY MODIFIED RADICAL Left 08/27/2017   Procedure: LEFT MODIFIED RADICAL MASTECTOMY;  Surgeon: Vanderbilt Ned, MD;  Location: [facility];  Service: General;  Laterality: Left;   MASTOPEXY Right 03/03/2019  Procedure: RIGHT BREAST MASTOPEXY/REDUCTION;  Surgeon: Lowery Estefana RAMAN, DO;  Location: [facility];  Service: Plastics;  Laterality: Right;  3 hours, please   PORT-A-CATH REMOVAL Right 08/27/2017   Procedure: REMOVAL PORT-A-CATH RIGHT CHEST;  Surgeon: Vanderbilt Ned, MD;  Location: [facility];  Service: General;  Laterality: Right;   PORTACATH PLACEMENT Right 01/28/2017   Procedure: INSERTION PORT-A-CATH WITH ULTRASOUND;  Surgeon: Vanderbilt Ned, MD;  Location: [facility];  Service: General;  Laterality: Right;   REDUCTION MAMMAPLASTY Right    REMOVAL OF TISSUE EXPANDER AND PLACEMENT OF IMPLANT Left 03/03/2019   Procedure: REMOVAL OF TISSUE EXPANDER AND PLACEMENT OF EXPANDER;  Surgeon: Lowery Estefana RAMAN, DO;  Location: [facility];  Service: Plastics;  Laterality: Left;   REMOVAL OF TISSUE EXPANDER AND PLACEMENT OF IMPLANT Left 05/25/2019   Procedure: LEFT BREAST REMOVAL OF TISSUE EXPANDER AND PLACEMENT OF IMPLANT;  Surgeon: Lowery Estefana RAMAN, DO;  Location: [facility];  Service: Plastics;  Laterality: Left;   TISSUE EXPANDER PLACEMENT Left 11/03/2018   Procedure: PLACEMENT OF TISSUE EXPANDER LEFT BREAST;  Surgeon: Lowery Estefana RAMAN, DO;  Location: [facility];  Service: Plastics;  Laterality: Left;  Total case time is 3.5 hours   TUBAL LIGATION Bilateral 01/21/2011    Allergies   Allergen Reactions   Dilaudid  [Hydromorphone ] Itching    BP 126/65   Ht 5' 5 (1.651 m)   Wt 271 lb (122.9 kg)   LMP 11/28/2016   BMI 45.10 kg/m       No data to display              No data to display              Objective:  Physical Exam: Gen: NAD, comfortable in exam room  Bilateral Knee Exam No gross deformity or effusion. TTP at medial and lateral joint lines. FROM with normal strength. Does have palpable popping/clicking with flexion and extension. Negative anterior/posterior drawers. Negative valgus/varus testing. Negative McMurray's. NVI distally.  Assessment and Plan:  Bilateral knee pain - osteoarthritis. Discussed possible therapies including PRP up to eventual joint replacement, though I do not think her quality of life is poor enough at this point to warrant. She is agreeable to continuing PT and home exercises, supportive medicines including tylenol , Voltaren  gel, and antiinflammatories as needed. Follow up as needed.

## 2024-04-28 ENCOUNTER — Encounter: Payer: Self-pay | Admitting: Physical Therapy

## 2024-04-28 ENCOUNTER — Ambulatory Visit: Payer: Self-pay | Admitting: Physical Therapy

## 2024-04-28 DIAGNOSIS — M25562 Pain in left knee: Secondary | ICD-10-CM

## 2024-04-28 DIAGNOSIS — M25671 Stiffness of right ankle, not elsewhere classified: Secondary | ICD-10-CM

## 2024-04-28 DIAGNOSIS — M25561 Pain in right knee: Secondary | ICD-10-CM | POA: Diagnosis not present

## 2024-04-28 DIAGNOSIS — G8929 Other chronic pain: Secondary | ICD-10-CM | POA: Diagnosis not present

## 2024-04-28 DIAGNOSIS — M25571 Pain in right ankle and joints of right foot: Secondary | ICD-10-CM | POA: Diagnosis not present

## 2024-04-28 NOTE — Therapy (Signed)
 OUTPATIENT PHYSICAL THERAPY TREATMENT   Patient Name: KALYSTA KNEISLEY MRN: [id number] DOB:10-14-1970, 53 y.o., female Today's Date: 04/28/2024   PT End of Session - 04/28/24 0934     Visit Number 24    Date for Recertification  05/20/24    Authorization Type [employer]    PT Start Time [phone]    PT Stop Time 1015    PT Time Calculation (min) 41 min             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, left (HCC) 01/23/2017   Cellulitis of right leg    Edema, lower extremity    GERD (gastroesophageal reflux disease)    Healthcare maintenance 03/25/2017   Herpesviral infection    Hot flashes 03/2017   Hyperlipidemia 03/20/2016   Impaired fasting glucose    Morbid obesity with body mass index (BMI) of 40.0 to 49.9 (HCC) 09/17/2017   Neoplasm of cervix    Osteoarthritis of knee    Other chest pain    Other specified disorders of veins    Personal history of chemotherapy    Personal history of radiation therapy    Polyneuropathy    Postmenopausal atrophic vaginitis    Pre-diabetes 07/26/2018   Hgb A1C elevated on 07/26/2018, Gestational Diabetes 2012   Pure hypercholesterolemia 03/30/2023   Seasonal allergies 2012   seasonal allergies causes allergic rhinitis and itchy, dry eyes per pt   Stress incontinence    Tachycardia    Thyrotoxicosis    Vitamin D  deficiency 05/2015   Past Surgical History:  Procedure Laterality Date   BREAST RECONSTRUCTION WITH PLACEMENT OF TISSUE EXPANDER AND FLEX HD (ACELLULAR HYDRATED DERMIS) Left 08/27/2017   Procedure: LEFT BREAST RECONSTRUCTION WITH PLACEMENT OF TISSUE EXPANDER AND FLEX HD;  Surgeon: Lowery Estefana RAMAN, DO;  Location: [facility];  Service:  Plastics;  Laterality: Left;   CESAREAN SECTION     x2   KNEE ARTHROSCOPY WITH MEDIAL MENISECTOMY Right 11/12/2022   Procedure: RIGHT KNEE ARTHROSCOPY, PARTIAL MEDIAL MENISCECTOMY;  Surgeon: Jerri Kay HERO, MD;  Location: [facility];  Service: Orthopedics;  Laterality: Right;   LATISSIMUS FLAP TO BREAST Left 11/03/2018   LATISSIMUS FLAP TO BREAST Left 11/03/2018   Procedure: LATISSIMUS FLAP TO LEFT BREAST;  Surgeon: Lowery Estefana RAMAN, DO;  Location: [facility];  Service: Plastics;  Laterality: Left;   MASTECTOMY MODIFIED RADICAL Left 08/27/2017   MASTECTOMY MODIFIED RADICAL Left 08/27/2017   Procedure: LEFT MODIFIED RADICAL MASTECTOMY;  Surgeon: Vanderbilt Ned, MD;  Location: [facility];  Service: General;  Laterality: Left;   MASTOPEXY Right 03/03/2019   Procedure: RIGHT BREAST MASTOPEXY/REDUCTION;  Surgeon: Lowery Estefana RAMAN, DO;  Location: [facility];  Service: Plastics;  Laterality: Right;  3 hours, please   PORT-A-CATH REMOVAL Right 08/27/2017   Procedure: REMOVAL PORT-A-CATH RIGHT CHEST;  Surgeon: Vanderbilt Ned, MD;  Location: [facility];  Service: General;  Laterality: Right;   PORTACATH PLACEMENT Right 01/28/2017   Procedure: INSERTION PORT-A-CATH WITH ULTRASOUND;  Surgeon: Vanderbilt Ned, MD;  Location: [facility];  Service: General;  Laterality: Right;   REDUCTION MAMMAPLASTY Right    REMOVAL OF TISSUE EXPANDER AND  PLACEMENT OF IMPLANT Left 03/03/2019   Procedure: REMOVAL OF TISSUE EXPANDER AND PLACEMENT OF EXPANDER;  Surgeon: Lowery Estefana RAMAN, DO;  Location: [facility];  Service: Plastics;  Laterality: Left;   REMOVAL OF TISSUE EXPANDER AND PLACEMENT OF IMPLANT Left 05/25/2019   Procedure: LEFT BREAST REMOVAL OF TISSUE EXPANDER AND PLACEMENT OF IMPLANT;  Surgeon: Lowery Estefana RAMAN, DO;  Location: [facility];  Service: Plastics;  Laterality: Left;   TISSUE EXPANDER PLACEMENT Left 11/03/2018   Procedure: PLACEMENT OF  TISSUE EXPANDER LEFT BREAST;  Surgeon: Lowery Estefana RAMAN, DO;  Location: [facility];  Service: Plastics;  Laterality: Left;  Total case time is 3.5 hours   TUBAL LIGATION Bilateral 01/21/2011   Patient Active Problem List   Diagnosis Date Noted   SOBOE (shortness of breath on exertion) 11/11/2023   Primary osteoarthritis of left knee 10/09/2023   Achilles tendinitis, right leg 10/09/2023   Graves disease 07/16/2023   Multinodular goiter 07/16/2023   Right carpal tunnel syndrome 07/10/2023   Left carpal tunnel syndrome 07/10/2023   Pain of Right Knee 07/06/2023   History of breast reconstruction 05/15/2023   Pure hypercholesterolemia 03/30/2023   Acute meniscal tear, medial, right, initial encounter 11/12/2022   Plica syndrome of right knee 11/12/2022   Chondromalacia, right knee 11/12/2022   Depression 08/13/2022   BMI 40.0-44.9, adult (HCC) 08/13/2022   Obesity, Beginning BMI 08/13/2022   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   Boil of buttock, Left 12/24/2021   Hyperthyroidism 07/22/2021   Low serum thyroid  stimulating hormone (TSH) 06/12/2021   Other fatigue 05/23/2021   Hemoglobin low 05/23/2021   Polyphagia 05/16/2020   Vitamin D  deficiency 05/16/2020   At risk for osteoporosis 05/16/2020   Prediabetes 05/01/2020   Elevated blood pressure reading 05/01/2020   At risk for impaired metabolic function 05/01/2020   Breast asymmetry following reconstructive surgery 09/20/2019   Essential hypertension 08/17/2018   Class 3 severe obesity with serious comorbidity and body mass index (BMI) of 40.0 to 44.9 in adult [facility]) 09/17/2017   Acquired absence of left breast 09/15/2017   Malignant neoplasm of upper-inner quadrant of left breast in female, 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   Healthcare maintenance 03/25/2017   Malignant 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, estrogen receptor positive (HCC) 01/23/2017   Carcinoma of breast metastatic to  axillary lymph node, left (HCC) 01/23/2017    PCP: [name], MD  REFERRING PROVIDER: Cleatrice Ludie SAUNDERS, MD  THERAPY DIAG:  Left knee pain, unspecified chronicity  Stiffness of right ankle, not elsewhere classified  Chronic pain of right knee  Pain in right ankle and joints of right foot  REFERRING DIAG: Primary osteoarthritis of left knee [M17.12], Chondromalacia, right knee [M94.261], Right Achilles tendinitis [M76.61]   Rationale for Evaluation and Treatment:  Rehabilitation  SUBJECTIVE:  PERTINENT PAST HISTORY:  Hx of breast cancer, R knee pain, L knee pain      PRECAUTIONS: None  WEIGHT BEARING RESTRICTIONS No  FALLS:  Has patient fallen in last 6 months? No, Number of falls: 0  MOI/History of condition:  Onset date: January  SUBJECTIVE STATEMENT Pt reports that overall she is seeing good improvement. Feels like she is doing better walking down hills.   EVAL Rafia C Kemnitz is a 53 y.o. female who presents to clinic with chief complaint of R posterior ankle pain.  Started in January with no specific inciting event.  Has slowly worsened since that time.  The  has used a boot as needed, she is using nitroglycerine patches, she ices.  She has some modest improvement but nothing significant.  Attempted eccentrics which were too painful.     Pain:  Are you having pain? Yes Pain location: R insertion of achilles tendon NPRS scale: 5-6/10 BEST: 2/10, Worst: 6/10 Aggravating factors: walking, driving, steps Relieving factors: rest, ice Pain description: sharp and aching  Are you having pain? 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 Moderate reactive joint effusion and small leaking Baker's cyst.   Heart size radial tear at the root of the medial meniscus with medial displacement of body. Mucoid degeneration in the posterior horn and body of the medial meniscus.  GENERAL OBSERVATION/GAIT:  Antalgic gait with reduced time in stance on R  PALPATION: TTP insertion of R achilles tendon  LE MMT:  MMT Right (Eval) Left (Eval) Right 01/20/24 Right 03/04/24 Right  03/18/24 R/L 10/16  Hip flexion (L2, L3)        Knee extension (L3) 4 4    4*/4*  Knee flexion 4 4    4*/4*  Hip abduction        Hip extension        Hip external rotation        Hip internal rotation        Hip adduction        Ankle dorsiflexion (L4) 5 5      Ankle plantarflexion (S1) Partial ROM SL eel raise unable  Unable  Partial ROM   Ankle inversion 4 5 5      Ankle eversion 4 5 5      Great Toe ext (L5)        Grossly         (Blank rows = not tested, score listed is out of 5 possible points OR may be listed in lbs of force.  N = WNL, D = diminished, C = clear for gross weakness with myotome testing, * = concordant pain with testing)  LE ROM:  ROM Right (Eval) Left (Eval) R/L 10/16  Hip flexion     Hip extension     Hip abduction     Hip adduction     Hip internal rotation     Hip external rotation     Knee extension   Lacking 3/0  Knee flexion   110/95  Ankle dorsiflexion 10 10   Ankle plantarflexion 50 50   Ankle inversion 35 35   Ankle eversion 20 20    (Blank rows = not tested, N = WNL, * = concordant pain with testing)  Functional Tests  Eval 01/22/24 9/26  Progressive balance screen (highest level completed for >/= 10''):  Feet together: 10'' Semi Tandem: R in rear 10'', L in rear 10'' Tandem: R in rear 4'', L in rear 8'' SLS: R unable, L unable  SLS 28 sec  right  SLS 41 sec Right  SLS 60 left            PATIENT SURVEYS:  LEFS: 33/80 LEFS: 59/80   [facility] Adult PT  Treatment:                                                DATE: 04/28/24  Therapeutic Exercise: Nustep L5 LE only x 4 min SLR 2x10 ea - 3# Hip abdct 2x10 each   Therapeutic Activity  Bil knee ext machine - 10# x10, 15# 3x5 HS curl - 35# - 3 sets ~10 Leg press - 65#   TODAY'S TREATMENT:  TREATMENT 04/21/24:  Aquatic therapy at [facility]- [address] - therapeutic pool temp 90-92 degrees   Aquatic Therapy:  Water walking for warm up fwd/lat/bkwds  Standing hip abd SL heel raise Knee flexion stretch HS stretch Step up fwd and lat STS to step from bench Noodle stomp  Pt requires the buoyancy of water for active assisted exercises with buoyancy supported for strengthening and AROM exercises. Hydrostatic pressure also supports joints by unweighting joint load by at least 50 % in 3-4 feet depth water. 80% in chest to neck deep water. Water will provide assistance with movement using the current and laminar flow while the buoyancy reduces weight bearing. Pt requires the viscosity of the water for resistance with strengthening exercises.   [facility] Adult PT Treatment:                                                DATE: 04/14/24 Therapeutic Exercise: Nustep L4 LE only x 5 minu Seated LAQ with ball squeeze SLR x 8 each  Hip abdct x 10 each   Manual Therapy: Bilat Mcconnel tape to patella- medial pull     [facility] Adult PT Treatment:                                                DATE: 04/07/24  Therapeutic Exercise: Elliptical - 5 min SLR Bridge on ball - 2x10 SAQ - 4# - 2x10 KT tape   Therapeutic Activity  STS - 2x5    [facility] Adult PT Treatment:                                                DATE: 03/24/24  Therapeutic Exercise: Quad set with towel SLR Updating HEP SAQ  RE-EVAL  [facility] Adult PT Treatment:                                                 DATE: 03/18/24 Therapeutic Exercise: Heel raise x 10 Iso heel raise 30 se x 4  Eccentric heel raise x10 on step right Slant board heel raise Slant board stretch intermittently as needed  Seated Right heel raise 2 inch step and 25#KB added x 30  Neuromuscular re-ed: AIREX tandem  AIREX SLS trials     [facility] Adult PT Treatment:  DATE: 03/11/24 Therapeutic Exercise: Isometric heel raise 30 sec x 4  Wooden rocker board x 1 minute Slant board stretch x 2  Standing concentric heel raise to right eccentric lower x 10 each on 2 inch step Seated Right heel raise 2 inch step and 15# KB added x 30 Seated Soleus stretch with towel x2   Neuromuscular re-ed: SLS trails on foam oval 38 sec  Tandem on foam > 60 sec each             PATIENT EDUCATION ([place]/[facility]):  POC, diagnosis, prognosis, HEP, and outcome measures.  Pt educated via explanation, demonstration, and handout (HEP).  Pt confirms understanding verbally.   HOME EXERCISE PROGRAM: Access Code: [email or web address] URL: [email or web address] Date: 03/24/2024 Prepared by: Helene Gasmen  Program Notes toe Yoga  Exercises - Single Leg Stance  - 1 x daily - 7 x weekly - 1 sets - 4-5 reps - 30 to 60 seconds hold - Standing Eccentric Heel Raise  - 1 x daily - 7 x weekly - 1 sets - 4-5 reps - 30'' hold - Supine Quad Set  - 3 x daily - 7 x weekly - 2 sets - 10 reps - 5 second hold - Active Straight Leg Raise with Quad Set  - 1 x daily - 7 x weekly - 2 sets - 10 reps   Eval 8/21 10/30 04/14/24     Progressive loading as tolerated Isometrics 30-45'' x4-5 Knee sleeve? Aquatics Nustep ?      R LE/hip strength                                 ASSESSMENT:  CLINICAL IMPRESSION: Doing well overall with lower knee and ankle pain.  Spent time today reviewing gym machines so that pt can continue strengthening after D/C which is planned next visit.  Eval: Stewart is a 53  y.o. female who presents to clinic with signs and sxs consistent with R posterior heel pain.  Consistent with physician impression of insertional achilles tendinopathy.   She is using a heel lift and nitro patches.  Tried eccentrics but this was too much load and agging.  Started with band isometrics and will progress as able.   Lashika will benefit from skilled PT to address relevant deficits and improve comfort in walking and recreational activities.   OBJECTIVE IMPAIRMENTS: Pain, ankle ROM, gait, balance  ACTIVITY LIMITATIONS: walking, standing, housework, stairs  PERSONAL FACTORS: See medical history and pertinent history   REHAB POTENTIAL: Good  CLINICAL DECISION MAKING: Evolving/moderate complexity  EVALUATION COMPLEXITY: Moderate   GOALS:   SHORT TERM GOALS: Target date: 01/30/2024   Nelsie will be >75% HEP compliant to improve carryover between sessions and facilitate independent management of condition  Evaluation: ongoing Goal status: MET    LONG TERM GOALS: Target date: 02/27/2024 extended to 05/20/2024    Chantay will self report >/= 50% decrease in pain from evaluation to improve function in daily tasks  Evaluation/Baseline: 10/10 max pain 02/12/24: 50% overall improvement Goal status: MET    2.  Kellene will show a >/= 20 pt improvement in LEFS score (MCID is ~11% or 9 pts) as a proxy for functional improvement   Evaluation/Baseline: 33/80 pts 02/22/24: 59/80 Goal status: MET   3.  Karron will be able to complete yard work, including mowing her grass, not limited by pain  Evaluation/Baseline: limited 02/22/24: havent tried 03/04/24: did yard work Saturday, did  not increase pain Goal status: ONGOING   4.  Erricka will be able to walk for 30 min, not limited by pain   Evaluation/Baseline: 10 min 02/22/24: on feet at mall for 3 hours  Goal status: MET    5.  Rex will be able to perform at least partial ROM SL heel raise on R, not limited by  pain  Evaluation/Baseline: unable 03/04/24: unable  03/18/24: met x 1 Goal status: ONGOING            6.  Brehanna will improve the following MMTs to >/= 4+/5 with minimal pain to show improvement in strength:  knee ext and flexion   Evaluation/Baseline: see chart in note Goal status: NEW   7.  Olivine will be able to return to work with significant reduction in bil knee pain  Evaluation/Baseline: 10/10 pain by end of day Goal status: INITIAL    PLAN: PT FREQUENCY: 1-2x/week  PT DURATION: 8 weeks  PLANNED INTERVENTIONS:  [id number]- PT Re-evaluation, 97110-Therapeutic exercises, 97530- Therapeutic activity, [id number]- Neuromuscular re-education, 97535- Self Care, [id number]- Manual therapy, [id number]- Gait training, [id number]- Aquatic Therapy, [id number]- Electrical stimulation (manual), [id number]- Vasopneumatic device, [id number]- Traction (mechanical), [id number]- Ionotophoresis 4mg /ml Dexamethasone , Taping, Dry Needling, Joint manipulation, and Spinal manipulation.   Helene BRAVO Dezhane Staten PT 04/28/24 10:21 AM Phone: [phone] Fax: [phone]

## 2024-04-29 ENCOUNTER — Other Ambulatory Visit (HOSPITAL_COMMUNITY): Payer: Self-pay

## 2024-04-29 DIAGNOSIS — Z1211 Encounter for screening for malignant neoplasm of colon: Secondary | ICD-10-CM | POA: Diagnosis not present

## 2024-04-29 DIAGNOSIS — K5730 Diverticulosis of large intestine without perforation or abscess without bleeding: Secondary | ICD-10-CM | POA: Diagnosis not present

## 2024-04-29 DIAGNOSIS — D124 Benign neoplasm of descending colon: Secondary | ICD-10-CM | POA: Diagnosis not present

## 2024-04-29 DIAGNOSIS — D120 Benign neoplasm of cecum: Secondary | ICD-10-CM | POA: Diagnosis not present

## 2024-04-29 DIAGNOSIS — K648 Other hemorrhoids: Secondary | ICD-10-CM | POA: Diagnosis not present

## 2024-05-04 ENCOUNTER — Encounter: Payer: Self-pay | Admitting: Physical Therapy

## 2024-05-04 ENCOUNTER — Telehealth: Payer: Self-pay | Admitting: Hematology and Oncology

## 2024-05-04 ENCOUNTER — Ambulatory Visit: Payer: Self-pay | Admitting: Physical Therapy

## 2024-05-04 DIAGNOSIS — M25571 Pain in right ankle and joints of right foot: Secondary | ICD-10-CM | POA: Diagnosis not present

## 2024-05-04 DIAGNOSIS — M25671 Stiffness of right ankle, not elsewhere classified: Secondary | ICD-10-CM | POA: Diagnosis not present

## 2024-05-04 DIAGNOSIS — G8929 Other chronic pain: Secondary | ICD-10-CM

## 2024-05-04 DIAGNOSIS — M25562 Pain in left knee: Secondary | ICD-10-CM

## 2024-05-04 DIAGNOSIS — M25561 Pain in right knee: Secondary | ICD-10-CM | POA: Diagnosis not present

## 2024-05-04 NOTE — Telephone Encounter (Signed)
 I attempted to reach patient to reschedule appointment with Dr. Loretha from 05/23/2024 to 06/10/2024.Mackenzie [name] is a research patient and will have to be seen before 06/13/2024. I will send a MyChart message and appointment reminder to address on file.

## 2024-05-04 NOTE — Therapy (Signed)
 PHYSICAL THERAPY DISCHARGE SUMMARY  Visits from Start of Care: 25  Current functional level related to goals / functional outcomes: See assessment/goals   Remaining deficits: See assessment/goals   Education / Equipment: HEP and D/C plans  Patient agrees to discharge. Patient goals were met. Patient is being discharged due to being pleased with the current functional level.  Patient Name: Mackenzie [name] MRN: [id number] DOB:07-Jun-1971, 53 y.o., female Today's Date: 05/04/2024   PT End of Session - 05/04/24 0932     Visit Number 25    Date for Recertification  05/20/24    Authorization Type [employer]    PT Start Time 0932    PT Stop Time 1012    PT Time Calculation (min) 40 min             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, left (HCC) 01/23/2017   Cellulitis of right leg    Edema, lower extremity    GERD (gastroesophageal reflux disease)    Healthcare maintenance 03/25/2017   Herpesviral infection    Hot flashes 03/2017   Hyperlipidemia 03/20/2016   Impaired fasting glucose    Morbid obesity with body mass index (BMI) of 40.0 to 49.9 (HCC) 09/17/2017   Neoplasm of cervix    Osteoarthritis of knee    Other chest pain    Other specified disorders of veins    Personal history of chemotherapy    Personal history of radiation therapy    Polyneuropathy    Postmenopausal atrophic vaginitis    Pre-diabetes 07/26/2018   Hgb A1C elevated on 07/26/2018, Gestational Diabetes 2012   Pure hypercholesterolemia 03/30/2023   Seasonal allergies 2012   seasonal allergies causes allergic rhinitis and itchy, dry eyes per pt   Stress incontinence    Tachycardia    Thyrotoxicosis     Vitamin D  deficiency 05/2015   Past Surgical History:  Procedure Laterality Date   BREAST RECONSTRUCTION WITH PLACEMENT OF TISSUE EXPANDER AND FLEX HD (ACELLULAR HYDRATED DERMIS) Left 08/27/2017   Procedure: LEFT BREAST RECONSTRUCTION WITH PLACEMENT OF TISSUE EXPANDER AND FLEX HD;  Surgeon: Lowery Estefana RAMAN, DO;  Location: [facility];  Service: Plastics;  Laterality: Left;   CESAREAN SECTION     x2   KNEE ARTHROSCOPY WITH MEDIAL MENISECTOMY Right 11/12/2022   Procedure: RIGHT KNEE ARTHROSCOPY, PARTIAL MEDIAL MENISCECTOMY;  Surgeon: Jerri Kay HERO, MD;  Location: [facility];  Service: Orthopedics;  Laterality: Right;   LATISSIMUS FLAP TO BREAST Left 11/03/2018   LATISSIMUS FLAP TO BREAST Left 11/03/2018   Procedure: LATISSIMUS FLAP TO LEFT BREAST;  Surgeon: Lowery Estefana RAMAN, DO;  Location: [facility];  Service: Plastics;  Laterality: Left;   MASTECTOMY MODIFIED RADICAL Left 08/27/2017   MASTECTOMY MODIFIED RADICAL Left 08/27/2017   Procedure: LEFT MODIFIED RADICAL MASTECTOMY;  Surgeon: Vanderbilt Ned, MD;  Location: [facility];  Service: General;  Laterality: Left;   MASTOPEXY Right 03/03/2019   Procedure: RIGHT BREAST MASTOPEXY/REDUCTION;  Surgeon: Lowery Estefana RAMAN, DO;  Location: [facility];  Service: Plastics;  Laterality: Right;  3 hours, please   PORT-A-CATH REMOVAL Right 08/27/2017   Procedure: REMOVAL PORT-A-CATH RIGHT CHEST;  Surgeon: Vanderbilt,  Debby, MD;  Location[facility];  Service: General;  Laterality: Right;   PORTACATH PLACEMENT Right 01/28/2017   Procedure: INSERTION PORT-A-CATH WITH ULTRASOUND;  Surgeon: Vanderbilt Debby, MD;  Location: [facility];  Service: General;  Laterality: Right;   REDUCTION MAMMAPLASTY Right    REMOVAL OF TISSUE EXPANDER AND PLACEMENT OF IMPLANT Left 03/03/2019   Procedure: REMOVAL OF TISSUE EXPANDER AND PLACEMENT OF EXPANDER;  Surgeon: Lowery Estefana RAMAN, DO;  Location: [facility];  Service: Plastics;   Laterality: Left;   REMOVAL OF TISSUE EXPANDER AND PLACEMENT OF IMPLANT Left 05/25/2019   Procedure: LEFT BREAST REMOVAL OF TISSUE EXPANDER AND PLACEMENT OF IMPLANT;  Surgeon: Lowery Estefana RAMAN, DO;  Location: [facility];  Service: Plastics;  Laterality: Left;   TISSUE EXPANDER PLACEMENT Left 11/03/2018   Procedure: PLACEMENT OF TISSUE EXPANDER LEFT BREAST;  Surgeon: Lowery Estefana RAMAN, DO;  Location: [facility];  Service: Plastics;  Laterality: Left;  Total case time is 3.5 hours   TUBAL LIGATION Bilateral 01/21/2011   Patient Active Problem List   Diagnosis Date Noted   SOBOE (shortness of breath on exertion) 11/11/2023   Primary osteoarthritis of left knee 10/09/2023   Achilles tendinitis, right leg 10/09/2023   Graves disease 07/16/2023   Multinodular goiter 07/16/2023   Right carpal tunnel syndrome 07/10/2023   Left carpal tunnel syndrome 07/10/2023   Pain of Right Knee 07/06/2023   History of breast reconstruction 05/15/2023   Pure hypercholesterolemia 03/30/2023   Acute meniscal tear, medial, right, initial encounter 11/12/2022   Plica syndrome of right knee 11/12/2022   Chondromalacia, right knee 11/12/2022   Depression 08/13/2022   BMI 40.0-44.9, adult (HCC) 08/13/2022   Obesity, Beginning BMI 08/13/2022   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   Boil of buttock, Left 12/24/2021   Hyperthyroidism 07/22/2021   Low serum thyroid  stimulating hormone (TSH) 06/12/2021   Other fatigue 05/23/2021   Hemoglobin low 05/23/2021   Polyphagia 05/16/2020   Vitamin D  deficiency 05/16/2020   At risk for osteoporosis 05/16/2020   Prediabetes 05/01/2020   Elevated blood pressure reading 05/01/2020   At risk for impaired metabolic function 05/01/2020   Breast asymmetry following reconstructive surgery 09/20/2019   Essential hypertension 08/17/2018   Class 3 severe obesity with serious comorbidity and body mass index (BMI) of 40.0 to 44.9 in adult [facility]) 09/17/2017    Acquired absence of left breast 09/15/2017   Malignant neoplasm of upper-inner quadrant of left breast in female, 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   Healthcare maintenance 03/25/2017   Malignant 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, estrogen receptor positive (HCC) 01/23/2017   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, left (HCC) 01/23/2017    PCP: [name], MD  REFERRING PROVIDER: Cleatrice Ludie SAUNDERS, MD  THERAPY DIAG:  Left knee pain, unspecified chronicity  Stiffness of right ankle, not elsewhere classified  Chronic pain of right knee  Pain in right ankle and joints of right foot  REFERRING DIAG: Primary osteoarthritis of left knee [M17.12], Chondromalacia, right knee [M94.261], Right Achilles tendinitis [M76.61]   Rationale for Evaluation and Treatment:  Rehabilitation  SUBJECTIVE:  PERTINENT PAST HISTORY:  Hx of breast cancer, R knee pain, L knee pain      PRECAUTIONS: None  WEIGHT BEARING RESTRICTIONS No  FALLS:  Has patient fallen in last 6 months? No, Number of falls: 0  MOI/History of condition:  Onset date: January  SUBJECTIVE STATEMENT Pt reports that overall  she is seeing good improvement. Feels like she is doing better walking down hills.   EVAL Mackenzie [name] is a 53 y.o. female who presents to clinic with chief complaint of R posterior ankle pain.  Started in January with no specific inciting event.  Has slowly worsened since that time.  The has used a boot as needed, she is using nitroglycerine patches, she ices.  She has some modest improvement but nothing significant.  Attempted eccentrics which were too painful.     Pain:  Are you having pain? Yes Pain location: R insertion of achilles tendon NPRS scale: 5-6/10 BEST: 2/10, Worst: 6/10 Aggravating factors: walking, driving, steps Relieving factors: rest, ice Pain description: sharp and aching  Are you having pain? 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 Moderate reactive joint effusion and small leaking Baker's cyst.   Heart size radial tear at the root of the medial meniscus with medial displacement of body. Mucoid degeneration in the posterior horn and body of the medial meniscus.  GENERAL OBSERVATION/GAIT:  Antalgic gait with reduced time in stance on R  PALPATION: TTP insertion of R achilles tendon  LE MMT:  MMT Right (Eval) Left (Eval) Right 01/20/24 Right 03/04/24 Right  03/18/24 R/L 10/16 R/L 11/26  Hip flexion (L2, L3)         Knee extension (L3) 4 4    4*/4* 4+/4+*  Knee flexion 4 4    4*/4* 4+/4  Hip abduction         Hip extension         Hip external rotation         Hip internal rotation         Hip adduction         Ankle dorsiflexion (L4) 5 5       Ankle plantarflexion (S1) Partial ROM SL eel raise unable  Unable  Partial ROM    Ankle inversion 4 5 5       Ankle eversion 4 5 5       Great Toe ext (L5)         Grossly          (Blank rows = not tested, score listed is out of 5 possible points OR may be listed in lbs of force.  N = WNL, D = diminished, C = clear for gross weakness with myotome testing, * = concordant pain with testing)  LE ROM:  ROM Right (Eval) Left (Eval) R/L 10/16  Hip flexion     Hip extension     Hip abduction     Hip adduction     Hip internal rotation     Hip external rotation     Knee extension   Lacking 3/0  Knee flexion   110/95  Ankle dorsiflexion 10 10   Ankle plantarflexion  50 50   Ankle inversion 35 35   Ankle eversion 20 20    (Blank rows = not tested, N = WNL, * = concordant pain with testing)  Functional Tests  Eval 01/22/24 9/26  Progressive balance screen (  highest level completed for >/= 10''):  Feet together: 10'' Semi Tandem: R in rear 10'', L in rear 10'' Tandem: R in rear 4'', L in rear 8'' SLS: R unable, L unable  SLS 28 sec right  SLS 41 sec Right  SLS 60 left            PATIENT SURVEYS:  LEFS: 33/80 LEFS: 59/80   [facility] Adult PT Treatment:                                                DATE: 05/04/24  Therapeutic Exercise: Nustep L5 LE only x 5 min  Therapeutic Activity  Bil knee ext machine - 10# x10, 15# HS curl - 35# - 3 sets ~10 Leg press - 65# Checking goals and D/C planning, updating HEP   TODAY'S TREATMENT:  TREATMENT 04/21/24:  Aquatic therapy at [facility]- [address] - therapeutic pool temp 90-92 degrees   Aquatic Therapy:  Water walking for warm up fwd/lat/bkwds  Standing hip abd SL heel raise Knee flexion stretch HS stretch Step up fwd and lat STS to step from bench Noodle stomp  Pt requires the buoyancy of water for active assisted exercises with buoyancy supported for strengthening and AROM exercises. Hydrostatic pressure also supports joints by unweighting joint load by at least 50 % in 3-4 feet depth water. 80% in chest to neck deep water. Water will provide assistance with movement using the current and laminar flow while the buoyancy reduces weight bearing. Pt requires the viscosity of the water for resistance with strengthening exercises.   [facility] Adult PT Treatment:                                                DATE: 04/14/24 Therapeutic Exercise: Nustep L4 LE only x 5 minu Seated LAQ with ball squeeze SLR x 8 each  Hip abdct x 10 each   Manual Therapy: Bilat Mcconnel tape to patella- medial pull     [facility] Adult PT Treatment:                                                DATE:  04/07/24  Therapeutic Exercise: Elliptical - 5 min SLR Bridge on ball - 2x10 SAQ - 4# - 2x10 KT tape   Therapeutic Activity  STS - 2x5    [facility] Adult PT Treatment:                                                DATE: 03/24/24  Therapeutic Exercise: Quad set with towel SLR Updating HEP SAQ  RE-EVAL  [facility] Adult PT Treatment:                                                DATE: 03/18/24 Therapeutic Exercise: Heel raise x 10 Iso heel  raise 30 se x 4  Eccentric heel raise x10 on step right Slant board heel raise Slant board stretch intermittently as needed  Seated Right heel raise 2 inch step and 25#KB added x 30  Neuromuscular re-ed: AIREX tandem  AIREX SLS trials     [facility] Adult PT Treatment:                                                DATE: 03/11/24 Therapeutic Exercise: Isometric heel raise 30 sec x 4  Wooden rocker board x 1 minute Slant board stretch x 2  Standing concentric heel raise to right eccentric lower x 10 each on 2 inch step Seated Right heel raise 2 inch step and 15# KB added x 30 Seated Soleus stretch with towel x2   Neuromuscular re-ed: SLS trails on foam oval 38 sec  Tandem on foam > 60 sec each             PATIENT EDUCATION (Felt/HM):  POC, diagnosis, prognosis, HEP, and outcome measures.  Pt educated via explanation, demonstration, and handout (HEP).  Pt confirms understanding verbally.   HOME EXERCISE PROGRAM: Access Code: [email or web address] URL: [email or web address] Date: 03/24/2024 Prepared by: Mackenzie [name]  Program Notes toe Yoga  Exercises - Single Leg Stance  - 1 x daily - 7 x weekly - 1 sets - 4-5 reps - 30 to 60 seconds hold - Standing Eccentric Heel Raise  - 1 x daily - 7 x weekly - 1 sets - 4-5 reps - 30'' hold - Supine Quad Set  - 3 x daily - 7 x weekly - 2 sets - 10 reps - 5 second hold - Active Straight Leg Raise with Quad Set  - 1 x daily - 7 x weekly - 2 sets - 10 reps   Eval 8/21 10/30 04/14/24      Progressive loading as tolerated Isometrics 30-45'' x4-5 Knee sleeve? Aquatics Nustep ?      R LE/hip strength                                 ASSESSMENT:  CLINICAL IMPRESSION: Upon goal recheck pt has made significant progress toward all short and long term goals.  Her heel pain is nearly resolved.  Her knee pain is ~50% improved.  She feels confident continuing HEP independently and we agree that D/C is appropriate.  Eval: Mackenzie [name] is a 53 y.o. female who presents to clinic with signs and sxs consistent with R posterior heel pain.  Consistent with physician impression of insertional achilles tendinopathy.   She is using a heel lift and nitro patches.  Tried eccentrics but this was too much load and agging.  Started with band isometrics and will progress as able.   Mackenzie [name] will benefit from skilled PT to address relevant deficits and improve comfort in walking and recreational activities.   OBJECTIVE IMPAIRMENTS: Pain, ankle ROM, gait, balance  ACTIVITY LIMITATIONS: walking, standing, housework, stairs  PERSONAL FACTORS: See medical history and pertinent history   REHAB POTENTIAL: Good  CLINICAL DECISION MAKING: Evolving/moderate complexity  EVALUATION COMPLEXITY: Moderate   GOALS:   SHORT TERM GOALS: Target date: 01/30/2024   Jaylie will be >75% HEP compliant to improve carryover between sessions and facilitate independent management of condition  Evaluation: ongoing Goal status: MET  LONG TERM GOALS: Target date: 02/27/2024 extended to 05/20/2024    Mackenzie [name] will self report >/= 50% decrease in pain from evaluation to improve function in daily tasks  Evaluation/Baseline: 10/10 max pain 02/12/24: 50% overall improvement Goal status: MET    2.  Mackenzie [name] will show a >/= 20 pt improvement in LEFS score (MCID is ~11% or 9 pts) as a proxy for functional improvement   Evaluation/Baseline: 33/80 pts 02/22/24: 59/80 Goal status: MET   3.  Mackenzie [name] will be able to  complete yard work, including mowing her grass, not limited by pain  Evaluation/Baseline: limited 02/22/24: havent tried 03/04/24: did yard work Saturday, did not increase pain 11/26: MET - modified Goal status: MET   4.  Mackenzie [name] will be able to walk for 30 min, not limited by pain   Evaluation/Baseline: 10 min 02/22/24: on feet at mall for 3 hours  Goal status: MET    5.  Mackenzie [name] will be able to perform at least partial ROM SL heel raise on R, not limited by pain  Evaluation/Baseline: unable 03/04/24: unable  03/18/24: met x 1 11/26: MET Goal status: MET            6.  Mackenzie [name] will improve the following MMTs to >/= 4+/5 with minimal pain to show improvement in strength:  knee ext and flexion   Evaluation/Baseline: see chart in note 11/26: MET Goal status: MET   7.  Mackenzie [name] will be able to return to work with significant reduction in bil knee pain  Evaluation/Baseline: 10/10 pain by end of day 11/26: 4-5/10 Goal status: MET    PLAN: PT FREQUENCY: 1-2x/week  PT DURATION: 8 weeks  PLANNED INTERVENTIONS:  [id number]- PT Re-evaluation, 97110-Therapeutic exercises, 97530- Therapeutic activity, 97112- Neuromuscular re-education, 97535- Self Care, [id number]- Manual therapy, [id number]- Gait training, [id number]- Aquatic Therapy, [phone]- Electrical stimulation (manual), [id number]- Vasopneumatic device, [id number]- Traction (mechanical), [id number]- Ionotophoresis 4mg /ml Dexamethasone , Taping, Dry Needling, Joint manipulation, and Spinal manipulation.   Mackenzie [name] PT 05/04/24 10:23 AM Phone: [phone] Fax: [phone]

## 2024-05-09 ENCOUNTER — Encounter (INDEPENDENT_AMBULATORY_CARE_PROVIDER_SITE_OTHER): Payer: Self-pay | Admitting: Nurse Practitioner

## 2024-05-09 ENCOUNTER — Other Ambulatory Visit (HOSPITAL_COMMUNITY): Payer: Self-pay

## 2024-05-09 ENCOUNTER — Ambulatory Visit (INDEPENDENT_AMBULATORY_CARE_PROVIDER_SITE_OTHER): Payer: Self-pay | Admitting: Nurse Practitioner

## 2024-05-09 ENCOUNTER — Other Ambulatory Visit: Payer: Self-pay

## 2024-05-09 VITALS — BP 134/84 | HR 67 | Temp 98.3°F | Ht 65.0 in | Wt 269.0 lb

## 2024-05-09 DIAGNOSIS — Z6841 Body Mass Index (BMI) 40.0 and over, adult: Secondary | ICD-10-CM | POA: Diagnosis not present

## 2024-05-09 DIAGNOSIS — K760 Fatty (change of) liver, not elsewhere classified: Secondary | ICD-10-CM

## 2024-05-09 DIAGNOSIS — F3289 Other specified depressive episodes: Secondary | ICD-10-CM

## 2024-05-09 DIAGNOSIS — E559 Vitamin D deficiency, unspecified: Secondary | ICD-10-CM | POA: Diagnosis not present

## 2024-05-09 DIAGNOSIS — E66813 Obesity, class 3: Secondary | ICD-10-CM

## 2024-05-09 DIAGNOSIS — F5089 Other specified eating disorder: Secondary | ICD-10-CM | POA: Diagnosis not present

## 2024-05-09 DIAGNOSIS — I10 Essential (primary) hypertension: Secondary | ICD-10-CM

## 2024-05-09 DIAGNOSIS — R7303 Prediabetes: Secondary | ICD-10-CM

## 2024-05-09 MED ORDER — METFORMIN HCL ER 500 MG PO TB24
500.0000 mg | ORAL_TABLET | Freq: Two times a day (BID) | ORAL | 0 refills | Status: AC
  Filled 2024-05-09 – 2024-05-20 (×2): qty 180, 90d supply, fill #0

## 2024-05-09 MED ORDER — BUPROPION HCL ER (SR) 200 MG PO TB12
200.0000 mg | ORAL_TABLET | Freq: Every day | ORAL | 0 refills | Status: AC
  Filled 2024-05-09 – 2024-07-14 (×2): qty 90, 90d supply, fill #0

## 2024-05-09 NOTE — Progress Notes (Signed)
 Office: [phone]  /  Fax: [phone]  WEIGHT SUMMARY AND BIOMETRICS  Weight Lost Since Last Visit: 0  Weight Gained Since Last Visit: 5 lb   Vitals Temp: 98.3 F (36.8 C) BP: 134/84 Pulse Rate: 67 SpO2: 100 %   Anthropometric Measurements Height: 5' 5 (1.651 m) Weight: 269 lb (122 kg) BMI (Calculated): 44.76 Weight at Last Visit: 264 lb Weight Lost Since Last Visit: 0 Weight Gained Since Last Visit: 5 lb Starting Weight: 267 lb Total Weight Loss (lbs): 0 lb (0 kg) Peak Weight: 320 lb   Body Composition  Body Fat %: 52 % Fat Mass (lbs): 140.2 lbs Muscle Mass (lbs): 123 lbs Total Body Water (lbs): 93 lbs Visceral Fat Rating : 18   Other Clinical Data Fasting: no Labs: no Today's Visit #: 83 Starting Date: 08/09/19    Total Weight Loss:0 Bio Impedance Data reviewed with patient: Muscle is down 3.8 pounds, adipose is up 9.4 pounds. Visceral fat rating went from 16 to 18. PBF increased from 49.5% to 52%  HPI  Chief Complaint: OBESITY  Mackenzie [name] is here to discuss her progress with her obesity treatment plan. She is on the the Category 3 Plan and states she is following her eating plan approximately 70 % of the time. She states she is exercising 30 minutes of walking 1 day per week.   Interval History:  Since last office visit she had Thanksgiving, ate more simple carbohydrates.  She is back to doing protein- trying to get 100-120 grams a day- restarting completely back on plan today. She is trying to get back to water aerobics but has been walking 30 minutes 1 time a week. Will walk for 30 minutes after her daughter gets on the bus.  She has recently finished PT for her knees- plans to do weights she was given by her PT. She does have a gym at work.  Patients do occasionally bring food to her work. She works at [employer]. Will try to only go to the break room at lunch.   Mackenzie [name] does have hypertension and BP's are currently well controlled with Losartan  50  mg every day, spironolactone  25 mg every day and Metoprolol  50 mg 1/2 tab daily. Denies headaches, chest pain, shortness of breath at rest and dizziness.  BP Readings from Last 3 Encounters:  05/09/24 134/84  04/27/24 126/65  04/05/24 99/68   She is currently using Wellbutrin  XL 150 mg every day for emotional eating behaviors and does believe this medication does help  She is on Metformin  XR 500 mg BID for prediabetes and MASLD. Denies side effects with medication. Her B12 level is normal.  Her A1c is 6.2 still in prediabetic range. Elevated fasting insulin .  She is doing Vit D with Calcium  daily and Vit D was in normal range.   Cancer screenings are reviewed with patient as obesity is a risk cancer for certain cancers.   Colonoscopy 2 weeks ago.  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 Body mass index is 44.76 kg/m.  General: Well Developed, well nourished, and in no acute distress.  HEENT: Normocephalic, atraumatic; EOMI, sclerae are anicteric. Skin: Warm and dry, good turgor Chest:  Normal excursion, shape, no gross ABN Respiratory: No conversational dyspnea; speaking in full sentences NeuroM-Sk:  Normal gross ROM *  4 extremities  Psych: A and O X 3, insight adequate, mood- full    DIAGNOSTIC DATA REVIEWED:  Last metabolic panel Lab Results  Component Value Date   GLUCOSE 120 (H) 04/05/2024   NA 135 04/05/2024   K 4.8 04/05/2024   CL 99 04/05/2024   CO2 20 04/05/2024   BUN 17 04/05/2024   CREATININE 0.97 04/05/2024   EGFR 69 04/05/2024   CALCIUM  9.6 04/05/2024   PHOS 3.7 06/09/2023   PROT 7.5 04/05/2024   ALBUMIN  4.3 04/05/2024   LABGLOB 3.2 04/05/2024   AGRATIO 1.4 09/03/2022   BILITOT 0.3 04/05/2024   ALKPHOS 139 (H) 04/05/2024    AST 19 04/05/2024   ALT 21 04/05/2024   ANIONGAP 7 12/01/2023     Lab Results  Component Value Date   HGBA1C 6.2 (H) 04/05/2024   HGBA1C 5.9 (H) 07/26/2018   Lab Results  Component Value Date   INSULIN  22.0 04/05/2024   INSULIN  12.6 08/09/2019   Lab Results  Component Value Date   TSH 1.73 01/13/2024   CBC Lab Results  Component Value Date   WBC 5.7 12/01/2023   HGB 12.6 12/01/2023   HCT 38.0 12/01/2023   MCV 82.4 12/01/2023   MCH 27.3 12/01/2023   RDW 14.5 12/01/2023   PLT 225 12/01/2023     Lipid Panel     Component Value Date/Time   CHOL 146 04/05/2024 0902   TRIG 57 04/05/2024 0902   HDL 44 04/05/2024 0902   CHOLHDL 3.3 04/05/2024 0902   CHOLHDL 3.4 06/27/2020 0800   VLDL 7 06/27/2020 0800   LDLCALC 90 04/05/2024 0902   Nutritional Lab Results  Component Value Date   VD25OH 91.8 04/05/2024   VD25OH 75.1 09/15/2023   VD25OH 65.8 06/08/2023     ASSESSMENT AND PLAN   Class 3 severe obesity with serious comorbidity and body mass index (BMI) of 40.0 to 44.9 in adult, unspecified obesity type (HCC) TREATMENT PLAN FOR OBESITY:  Recommended Dietary Goals  Mackenzie [name] is currently in the action stage of change. As such, her goal is to continue weight management plan. She has agreed to the Category 3 Plan.  Behavioral Intervention  We discussed the following Behavioral Modification Strategies today: increasing lean protein intake to established goals, celebration eating strategies- one plate with protein as largest portion, clean vegetable and then portion of anything she wants to try but food cannot touch on the plate, be mindful and enjoy your food  , continue to work on maintaining a reduced calorie state, getting the recommended amount of protein, incorporating whole foods, making healthy choices, staying well hydrated and practicing mindfulness when eating., increase protein intake, fibrous foods (25 grams per day for women, 30 grams for men) and water  to improve satiety and decrease hunger signals. , and getting back on track after recent relapse.   Recommended Physical Activity Goals  [name] has been advised to work up to 150 minutes of moderate intensity aerobic activity a week and strengthening exercises 2-3 times per week for cardiovascular health, weight loss maintenance and preservation of muscle mass.   She has agreed to Increase the intensity, frequency or duration of aerobic exercises to 150 minutes/week  and Combine aerobic and strengthening exercises for efficiency and improved cardiometabolic health.   Pharmacotherapy We discussed various medication options to help Mackenzie [name] with her weight loss efforts and we both agreed to continue Wellbutrin  SR 200 mg every day for emotional eating behaviors- medication is helping and denies side effects.  Continue Metformin  XR 500 mg 1 tab PO BID for prediabetes and MASLD.  ASSOCIATED CONDITIONS ADDRESSED TODAY  Action/Plan  Emotional Eating Behavior Continue Wellbutrin  SR 200 mg every day  Continue category 3 meal plan- decrease simple carbohydrates and increase lean proteins -     buPROPion  HCl ER (SR); Take 1 tablet (200 mg total) by mouth daily.  Dispense: 90 tablet; Refill: 0  MASLD Continue Category 3 meal plan and focus on limiting saturated fats and simple carbohydrates Loss of 10-15% of body weight can help improve hepatic steatosis  -     metFORMIN  HCl ER; Take 1 tablet (500 mg total) by mouth 2 (two) times daily with a meal.  Dispense: 180 tablet; Refill: 0  Essential hypertension Continue Losartan  50 mg every day, spironolactone  25 mg every day and Metoprolol  50 mg 1/2 tab every day Continue Category 3 meal plan  and DASH diet Monitor BP and if consistently >140/90 notify PCP If develops headaches, chest pain, shortness of breath or dizziness go to ER Continue to follow regularly with PCP Dr. Burney  Vitamin D  deficiency       Continue Vit D3 + calcium - last vit D  level was in normal range  Pre-diabetes Continue Category 3  meal plan, limit simple carbohydrates Continue Metformin  500 mg 1 tab PO BID  Continue exercise with current goal of 150 minutes of moderate to high intensity exercise/week.  -     metFORMIN  HCl ER; Take 1 tablet (500 mg total) by mouth 2 (two) times daily with a meal.  Dispense: 180 tablet; Refill: 0      Return in about 4 weeks (around 06/06/2024).SABRA She was informed of the importance of frequent follow up visits to maximize her success with intensive lifestyle modifications for her multiple health conditions.   ATTESTASTION STATEMENTS:  Reviewed by clinician on day of visit: allergies, medications, problem list, medical history, surgical history, family history, social history, and previous encounter notes.     Kirat Mezquita ANP-C

## 2024-05-19 ENCOUNTER — Other Ambulatory Visit (HOSPITAL_COMMUNITY): Payer: Self-pay

## 2024-05-20 ENCOUNTER — Other Ambulatory Visit: Payer: Self-pay

## 2024-05-20 ENCOUNTER — Other Ambulatory Visit (HOSPITAL_COMMUNITY): Payer: Self-pay

## 2024-05-23 ENCOUNTER — Ambulatory Visit: Admitting: Hematology and Oncology

## 2024-05-23 ENCOUNTER — Other Ambulatory Visit

## 2024-05-24 ENCOUNTER — Other Ambulatory Visit (HOSPITAL_COMMUNITY): Payer: Self-pay

## 2024-05-24 MED ORDER — SPIRONOLACTONE 25 MG PO TABS
25.0000 mg | ORAL_TABLET | Freq: Every day | ORAL | 0 refills | Status: AC
  Filled 2024-05-24: qty 90, 90d supply, fill #0

## 2024-05-27 MED ORDER — ROSUVASTATIN CALCIUM 20 MG PO TABS
20.0000 mg | ORAL_TABLET | Freq: Every day | ORAL | 0 refills | Status: AC
  Filled 2024-05-27 – 2024-06-07 (×2): qty 90, 90d supply, fill #0

## 2024-05-31 ENCOUNTER — Other Ambulatory Visit (HOSPITAL_COMMUNITY): Payer: Self-pay

## 2024-05-31 ENCOUNTER — Other Ambulatory Visit: Payer: Self-pay | Admitting: *Deleted

## 2024-05-31 DIAGNOSIS — C50812 Malignant neoplasm of overlapping sites of left female breast: Secondary | ICD-10-CM

## 2024-05-31 MED ORDER — MONTELUKAST SODIUM 10 MG PO TABS
10.0000 mg | ORAL_TABLET | Freq: Every day | ORAL | 0 refills | Status: AC
  Filled 2024-05-31 – 2024-06-07 (×2): qty 90, 90d supply, fill #0

## 2024-06-03 ENCOUNTER — Telehealth: Payer: Self-pay | Admitting: *Deleted

## 2024-06-03 NOTE — Telephone Encounter (Signed)
 [facility]  Reminded patient of appointments scheduled for 06/10/24 and patient states she will be out of town and requests rescheduled to following week 06/13/24 in the morning.  Scheduling request sent for next soonest available appointment in the morning.   Cherylyn Hoard, BSN, RN, [facility] Clinical Research Nurse II [phone] 06/03/2024 12:37 PM

## 2024-06-06 ENCOUNTER — Other Ambulatory Visit: Payer: Self-pay | Admitting: Family Medicine

## 2024-06-06 ENCOUNTER — Other Ambulatory Visit (HOSPITAL_COMMUNITY): Payer: Self-pay

## 2024-06-07 ENCOUNTER — Other Ambulatory Visit (HOSPITAL_COMMUNITY): Payer: Self-pay

## 2024-06-07 ENCOUNTER — Encounter: Payer: Self-pay | Admitting: Internal Medicine

## 2024-06-07 ENCOUNTER — Other Ambulatory Visit: Payer: Self-pay

## 2024-06-10 ENCOUNTER — Inpatient Hospital Stay: Admitting: Hematology and Oncology

## 2024-06-10 ENCOUNTER — Inpatient Hospital Stay

## 2024-06-13 ENCOUNTER — Ambulatory Visit (INDEPENDENT_AMBULATORY_CARE_PROVIDER_SITE_OTHER): Payer: Self-pay | Admitting: Adult Health

## 2024-06-13 ENCOUNTER — Inpatient Hospital Stay

## 2024-06-13 ENCOUNTER — Inpatient Hospital Stay: Admitting: Adult Health

## 2024-06-13 ENCOUNTER — Encounter (INDEPENDENT_AMBULATORY_CARE_PROVIDER_SITE_OTHER): Payer: Self-pay | Admitting: Adult Health

## 2024-06-13 VITALS — BP 128/74 | HR 68 | Temp 98.3°F | Ht 65.0 in | Wt 273.0 lb

## 2024-06-13 DIAGNOSIS — F3289 Other specified depressive episodes: Secondary | ICD-10-CM

## 2024-06-13 DIAGNOSIS — K760 Fatty (change of) liver, not elsewhere classified: Secondary | ICD-10-CM | POA: Diagnosis not present

## 2024-06-13 DIAGNOSIS — R7303 Prediabetes: Secondary | ICD-10-CM

## 2024-06-13 DIAGNOSIS — F5089 Other specified eating disorder: Secondary | ICD-10-CM | POA: Diagnosis not present

## 2024-06-13 DIAGNOSIS — I10 Essential (primary) hypertension: Secondary | ICD-10-CM | POA: Diagnosis not present

## 2024-06-13 DIAGNOSIS — E559 Vitamin D deficiency, unspecified: Secondary | ICD-10-CM

## 2024-06-13 DIAGNOSIS — Z6841 Body Mass Index (BMI) 40.0 and over, adult: Secondary | ICD-10-CM

## 2024-06-13 DIAGNOSIS — E669 Obesity, unspecified: Secondary | ICD-10-CM | POA: Diagnosis not present

## 2024-06-13 NOTE — Progress Notes (Signed)
 "    WEIGHT SUMMARY AND BIOMETRICS  Vitals Temp: 98.3 F (36.8 C) BP: 128/74 Pulse Rate: 68 SpO2: 100 %   Anthropometric Measurements Height: 5' 5 (1.651 m) Weight: 273 lb (123.8 kg) BMI (Calculated): 45.43 Weight at Last Visit: 269LB Weight Lost Since Last Visit: 0lb Weight Gained Since Last Visit: 4lb Starting Weight: 267lb Total Weight Loss (lbs): 0 lb (0 kg) Peak Weight: 320lb   Body Composition  Body Fat %: 51.5 % Fat Mass (lbs): 141 lbs Muscle Mass (lbs): 126.2 lbs Total Body Water (lbs): 90.8 lbs Visceral Fat Rating : 18   Other Clinical Data Fasting: Yes Labs: No Today's Visit #: 70 Starting Date: 08/09/19    Chief Complaint:   OBESITY Mackenzie [name] is here to discuss her progress with her obesity treatment plan.  She is on the the Category 3 Plan and states she is following her eating plan approximately 70 % of the time.  She states she is exercising: None  Interim History:  She and her daughter travelled to [place]  to visit her son over the holidays. She ate off plan while traveling. She continues to experience bilateral knee pain, R knee worse than L knee  Reviewed exercise modifications to allow for activity that will not worsen chronic pain.  [facility] [place] for 2026: Consistently meal plan and prep each week.  Subjective:   1. Vitamin D  deficiency  Latest Reference Range & Units 06/08/23 10:36 09/15/23 09:16 04/05/24 09:02  Vitamin D , 25-Hydroxy 30.0 - 100.0 ng/mL 65.8 75.1 91.8   She has continued daily OTC MVI She denies N/V/Muscle Weakness  2. Pre-diabetes She is currently on Metformin  XR 500mg  BID She would like to restart injectable AOMs Current insurance will not cover  3. Essential hypertension BP excellent and at goal She is currently on metoprolol  succinate (TOPROL -XL) 50 MG 24 hr tablet  nitroGLYCERIN  (NITRODUR - DOSED IN MG/24 HR) 0.2 mg/hr patch  losartan  (COZAAR ) 50 MG tablet  spironolactone  (ALDACTONE ) 25 MG tablet   rosuvastatin  (CRESTOR ) 20 MG tablet   4. Metabolic dysfunction-associated steatotic liver disease (MASLD) She denies RUQ pain 12/25/2023 Narrative & Impression  CLINICAL DATA:  Right upper quadrant pain and elevated liver enzymes.   EXAM: ULTRASOUND ABDOMEN LIMITED RIGHT UPPER QUADRANT   COMPARISON:  April 04, 2019   FINDINGS: Gallbladder:   A gallstone filled gallbladder is seen. This is present on the prior study. There is no evidence of gallbladder wall thickening (2.8 mm). No sonographic Murphy sign noted by sonographer.   Common bile duct:   Diameter: 2.9 mm   Liver:   No focal lesion identified. Diffusely increased echogenicity of the liver parenchyma is noted. Portal vein is patent on color Doppler imaging with normal direction of blood flow towards the liver.   Other: None.   IMPRESSION: 1. Cholelithiasis without evidence of acute cholecystitis. 2. Hepatic steatosis.     5. Emotional Eating Behavior She is on Wellbutrin  XR 200mg  each morning She denies SI/HI BP excellent and at goal  Assessment/Plan:   1. Vitamin D  deficiency (Primary) Continue daily MVI Monitor Labs  2. Pre-diabetes Continue to increase protein at meals and snacks Continue Metformin  XR BID- denies need for refill today  3. Essential hypertension Limit Na+ intake Increase exercise, consider seated exercises to adjust for worsening chronic R knee Continue   4. Metabolic dysfunction-associated steatotic liver disease (MASLD) Increase exercise, consider seated exercises to adjust for worsening chronic R knee Continue to increase protein at meals and snacks  5.  Emotional Eating Behavior Continue to increase protein at meals and snacks Increase exercise, consider seated exercises to adjust for worsening chronic R knee  6. Obesity, Current BMI 51.1  Mattisen is currently in the action stage of change. As such, her goal is to continue with weight loss efforts. She has agreed to  the Category 3 Plan.   Exercise goals: All adults should avoid inactivity. Some physical activity is better than none, and adults who participate in any amount of physical activity gain some health benefits. Adults should also include muscle-strengthening activities that involve all major muscle groups on 2 or more days a week. Seated exercises to adjust for chronic R knee pain  Behavioral modification strategies: increasing lean protein intake, decreasing simple carbohydrates, increasing vegetables, increasing water intake, no skipping meals, meal planning and cooking strategies, keeping healthy foods in the home, ways to avoid boredom eating, planning for success, and decreasing junk food.  Sidonie has agreed to follow-up with our clinic in 4 weeks. She was informed of the importance of frequent follow-up visits to maximize her success with intensive lifestyle modifications for her multiple health conditions.   Objective:   Blood pressure 128/74, pulse 68, temperature 98.3 F (36.8 C), height 5' 5 (1.651 m), weight 273 lb (123.8 kg), last menstrual period 11/28/2016, SpO2 100%, unknown if currently breastfeeding. Body mass index is 45.43 kg/m.  General: Cooperative, alert, well developed, in no acute distress. HEENT: Conjunctivae and lids unremarkable. Cardiovascular: Regular rhythm.  Lungs: Normal work of breathing. Neurologic: No focal deficits.   Lab Results  Component Value Date   CREATININE 0.97 04/05/2024   BUN 17 04/05/2024   NA 135 04/05/2024   K 4.8 04/05/2024   CL 99 04/05/2024   CO2 20 04/05/2024   Lab Results  Component Value Date   ALT 21 04/05/2024   AST 19 04/05/2024   GGT 49 06/09/2023   ALKPHOS 139 (H) 04/05/2024   BILITOT 0.3 04/05/2024   Lab Results  Component Value Date   HGBA1C 6.2 (H) 04/05/2024   HGBA1C 5.9 (H) 09/15/2023   HGBA1C 6.1 (H) 06/08/2023   HGBA1C 6.0 (H) 01/19/2023   HGBA1C 5.8 (H) 09/03/2022   Lab Results  Component Value Date    INSULIN  22.0 04/05/2024   INSULIN  14.7 09/15/2023   INSULIN  24.1 06/08/2023   INSULIN  15.4 01/19/2023   INSULIN  13.1 09/03/2022   Lab Results  Component Value Date   TSH 1.73 01/13/2024   Lab Results  Component Value Date   CHOL 146 04/05/2024   HDL 44 04/05/2024   LDLCALC 90 04/05/2024   TRIG 57 04/05/2024   CHOLHDL 3.3 04/05/2024   Lab Results  Component Value Date   VD25OH 91.8 04/05/2024   VD25OH 75.1 09/15/2023   VD25OH 65.8 06/08/2023   Lab Results  Component Value Date   WBC 5.7 12/01/2023   HGB 12.6 12/01/2023   HCT 38.0 12/01/2023   MCV 82.4 12/01/2023   PLT 225 12/01/2023   No results found for: IRON, TIBC, FERRITIN  Attestation Statements:   Reviewed by clinician on day of visit: allergies, medications, problem list, medical history, surgical history, family history, social history, and previous encounter notes.  Time spent on visit including pre-visit chart review and post-visit care and charting was 26 minutes.   I have reviewed the above documentation for accuracy and completeness, and I agree with the above. -  Jaekwon Mcclune d. Nou Chard, NP-C "

## 2024-06-15 ENCOUNTER — Inpatient Hospital Stay: Attending: Adult Health

## 2024-06-15 ENCOUNTER — Inpatient Hospital Stay

## 2024-06-15 ENCOUNTER — Inpatient Hospital Stay: Admitting: Adult Health

## 2024-06-15 ENCOUNTER — Encounter (INDEPENDENT_AMBULATORY_CARE_PROVIDER_SITE_OTHER): Payer: Self-pay | Admitting: Adult Health

## 2024-06-15 ENCOUNTER — Encounter: Payer: Self-pay | Admitting: *Deleted

## 2024-06-15 ENCOUNTER — Encounter: Payer: Self-pay | Admitting: Adult Health

## 2024-06-15 VITALS — BP 127/64 | HR 80 | Temp 98.2°F | Resp 17 | Wt 275.6 lb

## 2024-06-15 DIAGNOSIS — Z170 Estrogen receptor positive status [ER+]: Secondary | ICD-10-CM

## 2024-06-15 DIAGNOSIS — C50212 Malignant neoplasm of upper-inner quadrant of left female breast: Secondary | ICD-10-CM

## 2024-06-15 DIAGNOSIS — Z006 Encounter for examination for normal comparison and control in clinical research program: Secondary | ICD-10-CM | POA: Diagnosis not present

## 2024-06-15 DIAGNOSIS — Z1379 Encounter for other screening for genetic and chromosomal anomalies: Secondary | ICD-10-CM | POA: Diagnosis not present

## 2024-06-15 LAB — CBC WITH DIFFERENTIAL (CANCER CENTER ONLY)
Abs Immature Granulocytes: 0.02 K/uL (ref 0.00–0.07)
Basophils Absolute: 0 K/uL (ref 0.0–0.1)
Basophils Relative: 1 %
Eosinophils Absolute: 0.1 K/uL (ref 0.0–0.5)
Eosinophils Relative: 1 %
HCT: 39.3 % (ref 36.0–46.0)
Hemoglobin: 13 g/dL (ref 12.0–15.0)
Immature Granulocytes: 0 %
Lymphocytes Relative: 29 %
Lymphs Abs: 1.8 K/uL (ref 0.7–4.0)
MCH: 27.3 pg (ref 26.0–34.0)
MCHC: 33.1 g/dL (ref 30.0–36.0)
MCV: 82.6 fL (ref 80.0–100.0)
Monocytes Absolute: 0.7 K/uL (ref 0.1–1.0)
Monocytes Relative: 11 %
Neutro Abs: 3.7 K/uL (ref 1.7–7.7)
Neutrophils Relative %: 58 %
Platelet Count: 263 K/uL (ref 150–400)
RBC: 4.76 MIL/uL (ref 3.87–5.11)
RDW: 14.8 % (ref 11.5–15.5)
WBC Count: 6.3 K/uL (ref 4.0–10.5)
nRBC: 0 % (ref 0.0–0.2)

## 2024-06-15 LAB — CMP (CANCER CENTER ONLY)
ALT: 31 U/L (ref 0–44)
AST: 26 U/L (ref 15–41)
Albumin: 4.7 g/dL (ref 3.5–5.0)
Alkaline Phosphatase: 141 U/L — ABNORMAL HIGH (ref 38–126)
Anion gap: 10 (ref 5–15)
BUN: 27 mg/dL — ABNORMAL HIGH (ref 6–20)
CO2: 26 mmol/L (ref 22–32)
Calcium: 10 mg/dL (ref 8.9–10.3)
Chloride: 97 mmol/L — ABNORMAL LOW (ref 98–111)
Creatinine: 1 mg/dL (ref 0.44–1.00)
GFR, Estimated: >60 mL/min
Glucose, Bld: 131 mg/dL — ABNORMAL HIGH (ref 70–99)
Potassium: 4.5 mmol/L (ref 3.5–5.1)
Sodium: 133 mmol/L — ABNORMAL LOW (ref 135–145)
Total Bilirubin: 0.3 mg/dL (ref 0.0–1.2)
Total Protein: 8.5 g/dL — ABNORMAL HIGH (ref 6.5–8.1)

## 2024-06-15 NOTE — Progress Notes (Signed)
  Cancer Center Cancer Follow up:    Mackenzie Darice CROME, MD [address] [name] [address]   DIAGNOSIS: Cancer Staging  Malignant 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, estrogen receptor positive (HCC) Staging form: Breast, AJCC 8th Edition - Clinical: Stage IIA (cT3, cN2(f), cM0, G2, ER+, PR+, HER2-) - Signed by [name], MD on 06/27/2020 Method of lymph node assessment: Fine needle aspiration Histologic grading system: 3 grade system Laterality: Left Tumor size (mm): 120 - Pathologic: ypT3, ypN2a, cM0, G2, ER+, PR+, HER2- - Signed by [name], MD on 06/27/2020 Stage prefix: Post-therapy Neoadjuvant therapy: Yes Histologic grading system: 3 grade system    SUMMARY OF ONCOLOGIC HISTORY: Orin woman status post left breast upper inner quadrant biopsy 01/14/2017 for a clinical T3 N2, stage IIA invasive ductal carcinoma, grade 1 or 2, estrogen and progesterone receptor positive, HER-2 nonamplified, with an MIB-1 of 20%.   (1) staging studies: Brain MRI, bone scan, and CT scan of the chest 02/05/2017 showed no brain lesions, no lung or liver lesions, a 4.9 cm mass in the left breast with left axillary and subpectoral adenopathy, and nonspecific bone scan tracer at L2, left scapula, and anterior ribs, with lumbar spine MRI suggested for further evaluation.             (a) lumbar spine MRI 02/17/2017 showed no normal bone lesions.  There was mild lumbar spondylosis   (2) neoadjuvant chemotherapy consisting of cyclophosphamide  and doxorubicin  in dose dense fashion 4 started 02/10/2017, completed 03/24/2017, followed by weekly carboplatin  and gemcitabine  given days 1 and 8 of each 21-day cycle starting 04/14/2017, completing the planned 4 cycles 06/26/2017   (3) is post left modified radical mastectomy on 08/27/2017 showing an mpT3 pN2 residual invasive ductal carcinoma, grade 2, with a residual cancer burden of 3.  Margins were  clear             (a) latissimus flap reconstruction with expander 11/03/2018   (4) postmastectomy radiation completed 01/01/2018             (a) capecitabine  radiosensitization 11/16/2017-01/01/2018   (5) goserelin started 09/21/2017             (a) letrozole  started 01/11/2018             (b) enrolled in [name] clinical trial, randomized to ribociclib  on 05/31/2018-completed Ribocicilb 05/19/2021                (6) genetics testing 03/24/2017 through the Common Hereditary Cancer Panel offered by [facility] found no deleterious mutations in APC, ATM, AXIN2, BARD1, BMPR1A, BRCA1, BRCA2, BRIP1, CDH1, CDKN2A (p14ARF), CDKN2A (p16INK4a), CHEK2, CTNNA1, DICER1, EPCAM (Deletion/duplication testing only), GREM1 (promoter region deletion/duplication testing only), KIT, MEN1, MLH1, MSH2, MSH3, MSH6, MUTYH, NBN, NF1, NHTL1, PALB2, PDGFRA, PMS2, POLD1, POLE, PTEN, RAD50, RAD51C, RAD51D, SDHB, SDHC, SDHD, SMAD4, SMARCA4. STK11, TP53, TSC1, TSC2, and VHL.  The following genes were evaluated for sequence changes only: SDHA and HOXB13 c.251G>A variant only.     (7) Right breast reduction and left breast expander placement on 03/03/2019 with latissimus flap placement             (a) left implant implant exchanged on 05/25/2019 or a [employer]or Smooth Round Ultra High Profile Gel 750cc. Ref #[id number].  Serial Number [id number]  CURRENT THERAPY: Letrozole   INTERVAL HISTORY:  Discussed the use of AI scribe software for clinical note transcription with the patient, who gave verbal consent to  proceed.  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  She completed neoadjuvant chemotherapy, left mastectomy, adjuvant radiation with  capecitabine  radiosensitization, and ribociclib  on clinical trial in December 2022. She is now on letrozole  monotherapy. She is postmenopausal after stopping goserelin, with no vaginal bleeding. Surveillance right breast mammogram on November 12, 2023, showed no malignancy with density category C.  She reports new left breast and chest wall spasms and pain with certain movements, such as turning or removing her shirt, involving the entire left breast area. She has not noticed new masses, skin changes, or abnormal bleeding. She denies shortness of breath, fevers, chills, and night sweats.  She notes worsening bilateral knee pain that limits exercise and has contributed to weight gain. She feels this worsened after increased activity during recent travel. Limited activity despite a diet with regular fruits and vegetables. She takes metformin  and bupropion , previously tried semaglutide  without coverage, and is followed by endocrinology for hyperthyroidism.     Patient Active Problem List   Diagnosis Date Noted   SOBOE (shortness of breath on exertion) 11/11/2023   Primary osteoarthritis of left knee 10/09/2023   Achilles tendinitis, right leg 10/09/2023   Graves disease 07/16/2023   Multinodular goiter 07/16/2023   Right carpal tunnel syndrome 07/10/2023   Left carpal tunnel syndrome 07/10/2023   Pain of Right Knee 07/06/2023   History of breast reconstruction 05/15/2023   Pure hypercholesterolemia 03/30/2023   Acute meniscal tear, medial, right, initial encounter 11/12/2022   Plica syndrome of right knee 11/12/2022   Chondromalacia, right knee 11/12/2022   Depression 08/13/2022   BMI 40.0-44.9, adult (HCC) 08/13/2022   Obesity, Beginning BMI 08/13/2022   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   Boil of buttock, Left 12/24/2021   Hyperthyroidism 07/22/2021   Low serum thyroid  stimulating hormone (TSH) 06/12/2021   Other fatigue 05/23/2021   Hemoglobin low 05/23/2021   Polyphagia  05/16/2020   Vitamin D  deficiency 05/16/2020   At risk for osteoporosis 05/16/2020   Prediabetes 05/01/2020   Elevated blood pressure reading 05/01/2020   At risk for impaired metabolic function 05/01/2020   Breast asymmetry following reconstructive surgery 09/20/2019   Essential hypertension 08/17/2018   Class 3 severe obesity with serious comorbidity and body mass index (BMI) of 40.0 to 44.9 in adult [facility]) 09/17/2017   Acquired absence of left breast 09/15/2017   Malignant neoplasm of upper-inner quadrant of left breast in female, 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   Healthcare maintenance 03/25/2017   Malignant 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, estrogen receptor positive (HCC) 01/23/2017   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, left (HCC) 01/23/2017   Cellulitis of right leg    Edema, lower extremity    GERD (gastroesophageal reflux disease)    Healthcare maintenance 03/25/2017   Herpesviral infection  Hot flashes 03/2017   Hyperlipidemia 03/20/2016   Impaired fasting glucose    Morbid obesity with body mass index (BMI) of 40.0 to 49.9 (HCC) 09/17/2017   Neoplasm of cervix    Osteoarthritis of knee    Other chest pain    Other specified disorders of veins    Personal history of chemotherapy    Personal history of radiation therapy    Polyneuropathy    Postmenopausal atrophic vaginitis    Pre-diabetes 07/26/2018   Hgb A1C elevated on 07/26/2018, Gestational Diabetes 2012   Pure hypercholesterolemia 03/30/2023   Seasonal  allergies 2012   seasonal allergies causes allergic rhinitis and itchy, dry eyes per pt   Stress incontinence    Tachycardia    Thyrotoxicosis    Vitamin D  deficiency 05/2015    SURGICAL HISTORY: Past Surgical History:  Procedure Laterality Date   BREAST RECONSTRUCTION WITH PLACEMENT OF TISSUE EXPANDER AND FLEX HD (ACELLULAR HYDRATED DERMIS) Left 08/27/2017   Procedure: LEFT BREAST RECONSTRUCTION WITH PLACEMENT OF TISSUE EXPANDER AND FLEX HD;  Surgeon: Lowery Estefana RAMAN, DO;  Location: [facility];  Service: Plastics;  Laterality: Left;   CESAREAN SECTION     x2   KNEE ARTHROSCOPY WITH MEDIAL MENISECTOMY Right 11/12/2022   Procedure: RIGHT KNEE ARTHROSCOPY, PARTIAL MEDIAL MENISCECTOMY;  Surgeon: Jerri Kay HERO, MD;  Location: [facility];  Service: Orthopedics;  Laterality: Right;   LATISSIMUS FLAP TO BREAST Left 11/03/2018   LATISSIMUS FLAP TO BREAST Left 11/03/2018   Procedure: LATISSIMUS FLAP TO LEFT BREAST;  Surgeon: Lowery Estefana RAMAN, DO;  Location: [facility];  Service: Plastics;  Laterality: Left;   MASTECTOMY MODIFIED RADICAL Left 08/27/2017   MASTECTOMY MODIFIED RADICAL Left 08/27/2017   Procedure: LEFT MODIFIED RADICAL MASTECTOMY;  Surgeon: Vanderbilt Ned, MD;  Location: [facility];  Service: General;  Laterality: Left;   MASTOPEXY Right 03/03/2019   Procedure: RIGHT BREAST MASTOPEXY/REDUCTION;  Surgeon: Lowery Estefana RAMAN, DO;  Location: [facility];  Service: Plastics;  Laterality: Right;  3 hours, please   PORT-A-CATH REMOVAL Right 08/27/2017   Procedure: REMOVAL PORT-A-CATH RIGHT CHEST;  Surgeon: Vanderbilt Ned, MD;  Location: [facility];  Service: General;  Laterality: Right;   PORTACATH PLACEMENT Right 01/28/2017   Procedure: INSERTION PORT-A-CATH WITH ULTRASOUND;  Surgeon: Vanderbilt Ned, MD;  Location: [facility];  Service: General;  Laterality: Right;   REDUCTION MAMMAPLASTY Right    REMOVAL OF TISSUE EXPANDER AND PLACEMENT OF IMPLANT Left  03/03/2019   Procedure: REMOVAL OF TISSUE EXPANDER AND PLACEMENT OF EXPANDER;  Surgeon: Lowery Estefana RAMAN, DO;  Location: [facility];  Service: Plastics;  Laterality: Left;   REMOVAL OF TISSUE EXPANDER AND PLACEMENT OF IMPLANT Left 05/25/2019   Procedure: LEFT BREAST REMOVAL OF TISSUE EXPANDER AND PLACEMENT OF IMPLANT;  Surgeon: Lowery Estefana RAMAN, DO;  Location: [facility];  Service: Plastics;  Laterality: Left;   TISSUE EXPANDER PLACEMENT Left 11/03/2018   Procedure: PLACEMENT OF TISSUE EXPANDER LEFT BREAST;  Surgeon: Lowery Estefana RAMAN, DO;  Location: [facility];  Service: Plastics;  Laterality: Left;  Total case time is 3.5 hours   TUBAL LIGATION Bilateral 01/21/2011    SOCIAL HISTORY: Social History   Socioeconomic History   Marital status: Legally Separated    Spouse name: Not on file   Number of children: 4   Years of education: Not on file   Highest education level: Not on file  Occupational History   Occupation: RN  Tobacco Use  Smoking status: Never   Smokeless tobacco: Never  Vaping Use   Vaping status: Never Used  Substance and Sexual Activity   Alcohol use: Yes    Comment: social   Drug use: No   Sexual activity: Not on file    Comment: BTL  Other Topics Concern   Not on file  Social History Narrative   Right handed    Rx glasses    Rare caffeine use    Social Drivers of Health   Tobacco Use: Low Risk (06/13/2024)   Patient History    Smoking Tobacco Use: Never    Smokeless Tobacco Use: Never    Passive Exposure: Not on file  Financial Resource Strain: Not on file  Food Insecurity: Not on file  Transportation Needs: Not on file  Physical Activity: Sufficiently Active (02/03/2024)   Received from [facility]   Exercise Vital Sign    On average, how many days per week do you engage in moderate to strenuous exercise (like a brisk walk)?: 4 days    On average, how many minutes do you engage in exercise at this  level?: 50 min  Recent Concern: Physical Activity - Inactive (11/18/2023)   Received from [facility]   Exercise Vital Sign    On average, how many days per week do you engage in moderate to strenuous exercise (like a brisk walk)?: 0 days    On average, how many minutes do you engage in exercise at this level?: 0 min  Stress: Not on file  Social Connections: Not on file  Intimate Partner Violence: Not on file  Depression (EYV7-0): Not on file  Alcohol Screen: Not on file  Housing: Not on file  Utilities: Not on file  Health Literacy: Not on file    FAMILY HISTORY: Family History  Problem Relation Age of Onset   Other Mother        d.56 from complications of surgery to remove brain tumor   Diabetes Mother    Hypertension Mother    Stroke Mother    Kidney disease Father    Hypertension Maternal Grandmother    Breast cancer Paternal Grandmother 22       d.60s from breast cancer. Did not have treatment.   Hypertension Other    Diabetes Other    Stroke Other     Review of Systems  Constitutional:  Negative for appetite change, chills, fatigue, fever and unexpected weight change.  HENT:   Negative for hearing loss, lump/mass and trouble swallowing.   Eyes:  Negative for eye problems and icterus.  Respiratory:  Negative for chest tightness, cough and shortness of breath.   Cardiovascular:  Negative for chest pain, leg swelling and palpitations.  Gastrointestinal:  Negative for abdominal distention, abdominal pain, constipation, diarrhea, nausea and vomiting.  Endocrine: Negative for hot flashes.  Genitourinary:  Negative for difficulty urinating.   Musculoskeletal:  Negative for arthralgias.  Skin:  Negative for itching and rash.  Neurological:  Negative for dizziness, extremity weakness, headaches and numbness.  Hematological:  Negative for adenopathy. Does not bruise/bleed easily.  Psychiatric/Behavioral:  Negative for depression. The patient is not  nervous/anxious.       PHYSICAL EXAMINATION    Vitals:   06/15/24 0939  BP: 127/64  Pulse: 80  Resp: 17  Temp: 98.2 F (36.8 C)  SpO2: 99%    Physical Exam Constitutional:      General: She is not in acute distress.    Appearance:  Normal appearance. She is not toxic-appearing.  HENT:     Head: Normocephalic and atraumatic.     Mouth/Throat:     Mouth: Mucous membranes are moist.     Pharynx: Oropharynx is clear. No oropharyngeal exudate or posterior oropharyngeal erythema.  Eyes:     General: No scleral icterus. Cardiovascular:     Rate and Rhythm: Normal rate and regular rhythm.     Pulses: Normal pulses.     Heart sounds: Normal heart sounds.  Pulmonary:     Effort: Pulmonary effort is normal.     Breath sounds: Normal breath sounds.  Chest:     Comments: Right breast benign; left breast s/p mastectomy and radiation, no sign of local recurrence Abdominal:     General: Abdomen is flat. Bowel sounds are normal. There is no distension.     Palpations: Abdomen is soft.     Tenderness: There is no abdominal tenderness.  Musculoskeletal:        General: No swelling.     Cervical back: Neck supple.  Lymphadenopathy:     Cervical: No cervical adenopathy.     Upper Body:     Right upper body: No supraclavicular or axillary adenopathy.     Left upper body: No supraclavicular or axillary adenopathy.  Skin:    General: Skin is warm and dry.     Findings: No rash.  Neurological:     General: No focal deficit present.     Mental Status: She is alert.  Psychiatric:        Mood and Affect: Mood normal.        Behavior: Behavior normal.     LABORATORY DATA:  CBC    Component Value Date/Time   WBC 6.3 06/15/2024 0918   WBC 3.8 (L) 07/23/2021 0815   RBC 4.76 06/15/2024 0918   HGB 13.0 06/15/2024 0918   HGB 14.0 06/08/2023 1036   HGB 10.6 (L) 06/05/2017 1052   HCT 39.3 06/15/2024 0918   HCT 44.4 06/08/2023 1036   HCT 33.0 (L) 06/05/2017 1052   PLT 263  06/15/2024 0918   PLT 285 06/08/2023 1036   MCV 82.6 06/15/2024 0918   MCV 86 06/08/2023 1036   MCV 86.6 06/05/2017 1052   MCH 27.3 06/15/2024 0918   MCHC 33.1 06/15/2024 0918   RDW 14.8 06/15/2024 0918   RDW 13.2 06/08/2023 1036   RDW 16.0 (H) 06/05/2017 1052   LYMPHSABS 1.8 06/15/2024 0918   LYMPHSABS 1.3 06/08/2023 1036   LYMPHSABS 0.8 (L) 06/05/2017 1052   MONOABS 0.7 06/15/2024 0918   MONOABS 0.3 06/05/2017 1052   EOSABS 0.1 06/15/2024 0918   EOSABS 0.1 06/08/2023 1036   BASOSABS 0.0 06/15/2024 0918   BASOSABS 0.0 06/08/2023 1036   BASOSABS 0.0 06/05/2017 1052    CMP     Component Value Date/Time   NA 135 04/05/2024 0902   NA 134 (L) 06/05/2017 1052   K 4.8 04/05/2024 0902   K 3.5 06/05/2017 1052   CL 99 04/05/2024 0902   CO2 20 04/05/2024 0902   CO2 25 06/05/2017 1052   GLUCOSE 120 (H) 04/05/2024 0902   GLUCOSE 104 (H) 12/01/2023 0903   GLUCOSE 278 (H) 06/05/2017 1052   BUN 17 04/05/2024 0902   BUN 12.0 06/05/2017 1052   CREATININE 0.97 04/05/2024 0902   CREATININE 0.89 12/01/2023 0903   CREATININE 0.9 06/05/2017 1052   CALCIUM  9.6 04/05/2024 0902   CALCIUM  9.3 06/05/2017 1052   PROT 7.5 04/05/2024 0902  PROT 7.2 06/05/2017 1052   ALBUMIN  4.3 04/05/2024 0902   ALBUMIN  3.7 06/05/2017 1052   AST 19 04/05/2024 0902   AST 16 12/01/2023 0903   AST 19 06/05/2017 1052   ALT 21 04/05/2024 0902   ALT 22 12/01/2023 0903   ALT 28 06/05/2017 1052   ALKPHOS 139 (H) 04/05/2024 0902   ALKPHOS 95 06/05/2017 1052   BILITOT 0.3 04/05/2024 0902   BILITOT 0.4 12/01/2023 0903   BILITOT 0.26 06/05/2017 1052   GFRNONAA >60 12/01/2023 0903   GFRAA >60 01/09/2020 0827     ASSESSMENT and THERAPY PLAN:    Assessment and Plan Assessment & Plan Stage II invasive ductal carcinoma of the left breast, ER positive, HER2 negative, status post mastectomy and adjuvant therapy ER positive, HER2 negative stage II left breast carcinoma, status post neoadjuvant chemotherapy,  mastectomy, radiation, capecitabine  radiosensitization, and adjuvant endocrine therapy. Completed ribociclib  in December 2022 and remains on letrozole . New left breast pain and spasms raise concern for recurrence or silicone-related complications. - Performed breast examination; no clinical or radiographic sign of breast cancer recurrence - Ordered left breast ultrasound to evaluate patients discomfort - Guardant reveal testing every 6 months.   - Discussed possible additional imaging (mammogram or MRI) if ultrasound findings warrant. - Advised continuation of annual right breast screening mammograms.  Postmenopausal state secondary to cancer therapy Postmenopausal due to prior ovarian suppression and ongoing letrozole . No menses for several years, no vaginal bleeding. Ongoing hormone level monitoring is no longer indicated. - Discontinued routine hormone level monitoring.  Obesity with associated knee pain and limited mobility Obesity with worsening bilateral knee pain and limited mobility, exacerbated by increased activity. Letrozole  and hypoestrogenism contribute to weight gain and arthralgias. Limited exercise due to knee pain increases risk for deconditioning and loss of independence. - Encouraged aquatic exercise to minimize joint stress and improve mobility. - Advised follow-up with orthopedics or primary care if knee pain persists. - Discussed dietary goals of five servings of fruits and vegetables daily. - Discussed potential for newer GLP-1 agonists as cost decreases; advised to discuss with [facility] MD/APP - Encouraged continued participation in healthy weight and wellness programs.  Patient will return in 1 year for continued long term f/u, or sooner if needed.     All questions were answered. The patient knows to call the clinic with any problems, questions or concerns. We can certainly see the patient much sooner if necessary.  Total encounter time:30 minutes*in face-to-face visit  time, chart review, lab review, care coordination, order entry, and documentation of the encounter time.    Morna Kendall, NP 06/15/2024 9:44 AM Medical Oncology and Hematology [facility] [address] [place], [name] [address] Tel. [phone]    Fax. [phone]  *Total Encounter Time as defined by the Centers for Medicare and Medicaid Services includes, in addition to the face-to-face time of a patient visit (documented in the note above) non-face-to-face time: obtaining and reviewing outside history, ordering and reviewing medications, tests or procedures, care coordination (communications with other health care professionals or caregivers) and documentation in the medical record.

## 2024-06-15 NOTE — Research (Signed)
 [name] to Evaluate Efficacy and Safety of Ribociclib  With Endocrine Therapy as Adjuvant Treatment in Patients With HR+/HER2- Early Breast Cancer   Follow Up visit #2: [name] arrived today for her scheduled follow up visit on the [name] trial. This visit is 2 days outside the protocol allowed window for this visit. The delay was due to holiday scheduling issues related to provider's schedule and then patient's own subsequent holiday travel plans.   PROs:  Provided to patient after registration. Collected and reviewed for accuracy and completeness.   Labs: Blood collected for CBC, CMET, and Hormone levels per Dr. Loretha (not required for study).   Healthcare Resource Utilization: Patient did not have any ED visits or hospitalizations since last study contact. She did not have any visits in our clinic for any issues related to her breast cancer or treatment since her last study visit.   Serious Adverse Events: No SAEs since last study contact.   H & P and Clinical Eval for Recurrence:  See Morna Causey's progress note dated today.   Endocrine Treatment: Patient continues to take letrozole  daily as directed by Dr. Loretha.   Consent Addendum #3 to ICF v5.1: [name] currently participates in the above listed study.  This Clinical Research Nurse met with Mackenzie [name], [id number], on 06/15/2024 in a manner and location that ensures patient privacy to discuss reconsent. Patient is Unaccompanied.  A copy of the informed consent addendum was provided to the patient.  Patient reads, speaks, and understands English.   Consent amendments and changes were presented by this clinical research Nurse. These changes take place after today's study visit. Mackenzie [name] was provided an opportunity to ask questions and all questions were answered to patient's satisfaction.  The updated informed consent addendum was reviewed page by page.  The patient's mental and  emotional status is appropriate to provide informed consent, and the patient verbalizes an interest in continuing voluntary study participation.  Patient has voluntarily signed reconsent Addendum #3 to ICF v5.1, IRB approved 05/11/24 on 06/15/2024 at 10AM.  Consent addendum was provided to Dr. Loretha and her signature was also obtained. The patient was provided with a copy of the signed informed consent addendum for their reference.   Approximately 10 minutes were spent with the patient reviewing the informed consent documents.    Plan: Moving forward with new amendment, follow up visit will be annually. Patient will be scheduled for next study visit which is due 05/23/25 (+/- 4 weeks).   Patient has direct contact information of this Nurse and is encouraged to call with any questions. Patient aware that she should contact the clinic at any time if she has any questions or concerns prior to her next visit.  Patient thanked for her ongoing participation in the [place] trial.  Cherylyn Hoard, BSN, RN, [facility] Nurse II [phone] 06/15/2024

## 2024-06-16 LAB — LUTEINIZING HORMONE: LH: 8 m[IU]/mL

## 2024-06-16 LAB — FOLLICLE STIMULATING HORMONE: FSH: 21.5 m[IU]/mL

## 2024-06-17 ENCOUNTER — Other Ambulatory Visit (HOSPITAL_COMMUNITY): Payer: Self-pay

## 2024-06-20 ENCOUNTER — Other Ambulatory Visit: Payer: Self-pay | Admitting: Hematology and Oncology

## 2024-06-20 DIAGNOSIS — Z1231 Encounter for screening mammogram for malignant neoplasm of breast: Secondary | ICD-10-CM

## 2024-06-20 LAB — ESTRADIOL, ULTRA SENS: Estradiol, Sensitive: <2.5 pg/mL

## 2024-06-22 ENCOUNTER — Ambulatory Visit: Payer: Self-pay | Admitting: Hematology and Oncology

## 2024-06-27 ENCOUNTER — Other Ambulatory Visit: Payer: Self-pay

## 2024-06-28 ENCOUNTER — Encounter: Payer: Self-pay | Admitting: Adult Health

## 2024-06-29 LAB — GUARDANT REVEAL

## 2024-07-04 ENCOUNTER — Other Ambulatory Visit (HOSPITAL_COMMUNITY): Payer: Self-pay

## 2024-07-04 ENCOUNTER — Other Ambulatory Visit: Payer: Self-pay

## 2024-07-04 MED ORDER — GABAPENTIN 300 MG PO CAPS
300.0000 mg | ORAL_CAPSULE | Freq: Three times a day (TID) | ORAL | 0 refills | Status: AC
  Filled 2024-07-04 – 2024-07-09 (×2): qty 270, 90d supply, fill #0

## 2024-07-06 ENCOUNTER — Encounter: Payer: Self-pay | Admitting: Adult Health

## 2024-07-06 DIAGNOSIS — C50212 Malignant neoplasm of upper-inner quadrant of left female breast: Secondary | ICD-10-CM

## 2024-07-08 ENCOUNTER — Telehealth (INDEPENDENT_AMBULATORY_CARE_PROVIDER_SITE_OTHER): Payer: Self-pay | Admitting: *Deleted

## 2024-07-09 ENCOUNTER — Other Ambulatory Visit (HOSPITAL_COMMUNITY): Payer: Self-pay

## 2024-07-11 ENCOUNTER — Ambulatory Visit (INDEPENDENT_AMBULATORY_CARE_PROVIDER_SITE_OTHER): Admitting: Adult Health

## 2024-07-12 ENCOUNTER — Ambulatory Visit
Admission: RE | Admit: 2024-07-12 | Discharge: 2024-07-12 | Disposition: A | Source: Ambulatory Visit | Attending: Adult Health | Admitting: Adult Health

## 2024-07-12 DIAGNOSIS — C50212 Malignant neoplasm of upper-inner quadrant of left female breast: Secondary | ICD-10-CM

## 2024-07-12 NOTE — Telephone Encounter (Signed)
 done

## 2024-07-14 ENCOUNTER — Other Ambulatory Visit: Payer: Self-pay

## 2024-07-14 ENCOUNTER — Other Ambulatory Visit (HOSPITAL_COMMUNITY): Payer: Self-pay

## 2024-07-15 ENCOUNTER — Other Ambulatory Visit

## 2024-07-15 ENCOUNTER — Ambulatory Visit: Admitting: Internal Medicine

## 2024-07-15 ENCOUNTER — Encounter: Payer: Self-pay | Admitting: Internal Medicine

## 2024-07-15 VITALS — BP 138/82 | Ht 65.0 in | Wt 275.0 lb

## 2024-07-15 DIAGNOSIS — E0590 Thyrotoxicosis, unspecified without thyrotoxic crisis or storm: Secondary | ICD-10-CM

## 2024-07-15 DIAGNOSIS — E0500 Thyrotoxicosis with diffuse goiter without thyrotoxic crisis or storm: Secondary | ICD-10-CM

## 2024-07-15 DIAGNOSIS — E042 Nontoxic multinodular goiter: Secondary | ICD-10-CM

## 2024-07-15 LAB — TSH: TSH: 1.45 m[IU]/L

## 2024-07-15 LAB — T4, FREE: Free T4: 1.3 ng/dL (ref 0.8–1.8)

## 2024-07-15 NOTE — Progress Notes (Unsigned)
 "   Name: Mackenzie [name]  MRN/ DOB: [id number], 09-03-1970    Age/ Sex: 54 y.o., female    PCP: Burney Mackenzie CROME, MD   Reason for Endocrinology Evaluation:  Hyperthyroidism     Date of Initial Endocrinology Evaluation: 07/22/2021    HPI: [name] is a 54 y.o. female with a past medical history of HTN, hx of  left breast Ca . The patient presented for initial endocrinology clinic visit on 07/22/2021 for consultative assistance with her  hyperthyroidism.   Pt has been diagnosed with hyperthyroidism in 05/2021 with a suppressed TSH <0.005 uIU/mL and elevated T4  and T3 at 5.96 and 417 ng/dL respectively.    She has no hx of osteoporosis  Of note, she is s/p left mastectomy due to breast ca, followed by chemo and radiation    Methimazole  was started 07/2021 with a suppressed TSH<0.005u IU/mL, elevated free T4 at 5.96 NG/DL and elevated T3 at 582 NG/DL   TRAb was elevated at 9.2 IU/L  No fh of thyroid  disease   Thyroid  ultrasound 01/2023 showed multiple thyroid  nodules, none met FNA criteria    SUBJECTIVE:    Today (07/15/24): [name] is here for follow-up on hyperthyroidism.     She continues to follow-up with oncology for history of left breast cancer Weight remains stable No local neck swelling  No palpitations  No constipation or diarrhea  No tremors  Has minimal eye symptoms in the form of dryness.    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, left (HCC) 01/23/2017   Cellulitis of right leg    Edema, lower extremity    GERD (gastroesophageal reflux disease)    Healthcare maintenance 03/25/2017    Herpesviral infection    Hot flashes 03/2017   Hyperlipidemia 03/20/2016   Impaired fasting glucose    Morbid obesity with body mass index (BMI) of 40.0 to 49.9 (HCC) 09/17/2017   Neoplasm of cervix    Osteoarthritis of knee    Other chest pain    Other specified disorders of veins    Personal history of chemotherapy    Personal history of radiation therapy    Polyneuropathy    Postmenopausal atrophic vaginitis    Pre-diabetes 07/26/2018   Hgb A1C elevated on 07/26/2018, Gestational Diabetes 2012   Pure hypercholesterolemia 03/30/2023   Seasonal allergies 2012   seasonal allergies causes allergic rhinitis and itchy, dry eyes per pt   Stress incontinence    Tachycardia    Thyrotoxicosis    Vitamin D  deficiency 05/2015   Past Surgical History:  Past Surgical History:  Procedure Laterality Date   BREAST RECONSTRUCTION WITH PLACEMENT OF TISSUE EXPANDER AND FLEX HD (ACELLULAR HYDRATED DERMIS) Left 08/27/2017   Procedure: LEFT BREAST RECONSTRUCTION WITH PLACEMENT OF TISSUE EXPANDER AND FLEX HD;  Surgeon: Lowery Estefana RAMAN, DO;  Location: [facility];  Service: Plastics;  Laterality: Left;   CESAREAN SECTION     x2   KNEE ARTHROSCOPY WITH MEDIAL MENISECTOMY Right 11/12/2022   Procedure: RIGHT KNEE ARTHROSCOPY, PARTIAL MEDIAL MENISCECTOMY;  Surgeon: Jerri Kay HERO, MD;  Location: [facility];  Service: Orthopedics;  Laterality: Right;   LATISSIMUS FLAP TO BREAST Left 11/03/2018   LATISSIMUS FLAP TO BREAST Left 11/03/2018   Procedure: LATISSIMUS FLAP TO LEFT BREAST;  Surgeon: Lowery Estefana RAMAN, DO;  Location: [facility];  Service: Plastics;  Laterality: Left;   MASTECTOMY MODIFIED RADICAL Left 08/27/2017   MASTECTOMY MODIFIED RADICAL Left 08/27/2017   Procedure: LEFT MODIFIED RADICAL MASTECTOMY;  Surgeon: Vanderbilt Ned, MD;  Location: [facility];  Service: General;  Laterality: Left;   MASTOPEXY Right 03/03/2019   Procedure: RIGHT BREAST MASTOPEXY/REDUCTION;  Surgeon: Lowery Estefana RAMAN, DO;  Location: [facility];  Service: Plastics;  Laterality: Right;  3 hours, please   PORT-A-CATH REMOVAL Right 08/27/2017   Procedure: REMOVAL PORT-A-CATH RIGHT CHEST;  Surgeon: Vanderbilt Ned, MD;  Location: [facility];  Service: General;  Laterality: Right;   PORTACATH PLACEMENT Right 01/28/2017   Procedure: INSERTION PORT-A-CATH WITH ULTRASOUND;  Surgeon: Vanderbilt Ned, MD;  Location: [facility];  Service: General;  Laterality: Right;   REDUCTION MAMMAPLASTY Right    REMOVAL OF TISSUE EXPANDER AND PLACEMENT OF IMPLANT Left 03/03/2019   Procedure: REMOVAL OF TISSUE EXPANDER AND PLACEMENT OF EXPANDER;  Surgeon: Lowery Estefana RAMAN, DO;  Location: [facility];  Service: Plastics;  Laterality: Left;   REMOVAL OF TISSUE EXPANDER AND PLACEMENT OF IMPLANT Left 05/25/2019   Procedure: LEFT BREAST REMOVAL OF TISSUE EXPANDER AND PLACEMENT OF IMPLANT;  Surgeon: Lowery Estefana RAMAN, DO;  Location: [facility];  Service: Plastics;  Laterality: Left;   TISSUE EXPANDER PLACEMENT Left 11/03/2018   Procedure: PLACEMENT OF TISSUE EXPANDER LEFT BREAST;  Surgeon: Lowery Estefana RAMAN, DO;  Location: [facility];  Service: Plastics;  Laterality: Left;  Total case time is 3.5 hours   TUBAL LIGATION Bilateral 01/21/2011    Social History:  reports that she has never smoked. She has never used smokeless tobacco. She reports current alcohol use. She reports that she does not use drugs. Family History: family history includes Breast cancer (age of onset: 88) in her paternal grandmother; Diabetes in her mother and another family member; Hypertension in her maternal grandmother, mother, and another family member; Kidney disease in her father; Other in her mother; Stroke in her mother and another family member.   HOME MEDICATIONS: Allergies as of 07/15/2024       Reactions   Dilaudid  [hydromorphone ] Itching        Medication List        Accurate as of July 15, 2024  9:18 AM. If you have any questions, ask your nurse or doctor.          acetaminophen  500 MG tablet Commonly known as: TYLENOL  Take 1,000 mg by mouth every 6 (six) hours as needed (for pain/headaches.).   buPROPion  200 MG 12 hr tablet Commonly known as: Wellbutrin  SR Take 1 tablet (200 mg total) by mouth daily.   cetirizine 10 MG tablet Commonly known as: ZYRTEC Take 10 mg by mouth daily.   cyclobenzaprine  10 MG tablet Commonly known as: FLEXERIL  Take 1/2 tablet (5 mg total) by mouth 2 (two) times daily as needed.   diclofenac  75 MG EC tablet Commonly known as: VOLTAREN  Take 1 tablet (75 mg total) by mouth 2 (two) times daily as needed.   Fish Oil 1000 MG Caps Take 1,000 mg by mouth 2 (two) times daily.   gabapentin  300 MG capsule Commonly known as: NEURONTIN  Take 1 capsule (300 mg total)  by mouth 3 (three) times daily.   letrozole  2.5 MG tablet Commonly known as: FEMARA  Take 1 tablet (2.5 mg total) by mouth daily.   loratadine  10 MG tablet Commonly known as: Claritin  Take 1 tablet (10 mg total) by mouth daily.   losartan  50 MG tablet Commonly known as: COZAAR  Take 1 tablet (50 mg total) by mouth daily.   metFORMIN  500 MG 24 hr tablet Commonly known as: GLUCOPHAGE -XR Take 1 tablet (500 mg total) by mouth 2 (two) times daily with a meal.   methimazole  5 MG tablet Commonly known as: TAPAZOLE  Take 1 tablet (5 mg total) by mouth daily in the afternoon.   metoprolol  succinate 50 MG 24 hr tablet Commonly known as: TOPROL -XL Take 1/2 tablet (25 mg total) by mouth daily.   montelukast  10 MG tablet Commonly known as: SINGULAIR  Take 1 tablet (10 mg total) by mouth at bedtime.   multivitamin with minerals Tabs tablet Take 1 tablet by mouth daily.   nitroGLYCERIN  0.2 mg/hr patch Commonly known as: NITRODUR - Dosed in mg/24 hr Apply 1/4th patch to affected achilles, change daily   omeprazole  40 MG capsule Commonly known as: PRILOSEC Take 1 capsule by  mouth daily 1/2 to 1 hour before morning meal   ondansetron  4 MG tablet Commonly known as: Zofran  Take 1 tablet (4 mg total) by mouth every 8 (eight) hours as needed for nausea or vomiting.   PROBIOTIC DAILY PO Take 1 capsule by mouth 2 (two) times daily.   rosuvastatin  20 MG tablet Commonly known as: CRESTOR  Take 1 tablet (20 mg total) by mouth daily.   spironolactone  25 MG tablet Commonly known as: ALDACTONE  Take 1 tablet (25 mg total) by mouth daily.   traMADol  50 MG tablet Commonly known as: ULTRAM  Take 1-2 tablets (50-100 mg total) by mouth daily as needed.   VITAMIN C PO Take by mouth daily.   VITAMIN D3/CALCIUM /PHOSPHORUS PO Take 600 mg by mouth daily.   ZINC OXIDE PO Take 1 tablet by mouth 2 (two) times a day.          REVIEW OF SYSTEMS: A comprehensive ROS was conducted with the patient and is negative except as per HPI     OBJECTIVE:  VS: BP 138/82   Ht 5' 5 (1.651 m)   Wt 275 lb (124.7 kg)   LMP 11/28/2016   BMI 45.76 kg/m    Wt Readings from Last 3 Encounters:  07/15/24 275 lb (124.7 kg)  06/15/24 275 lb 9.6 oz (125 kg)  06/13/24 273 lb (123.8 kg)     EXAM: General: Pt appears well and is in NAD  Neck:  Thyroid : Thyroid  gland is prominent  Lungs: Clear with good BS bilat   Heart: Auscultation: RRR.  Abdomen:  soft, nontender  Extremities:  BL LE: No pretibial edema   Mental Status: Judgment, insight: Intact Orientation: Oriented to time, place, and person Mood and affect: No depression, anxiety, or agitation     DATA REVIEWED:  ****     Latest Reference Range & Units 12/01/23 09:03  Sodium 135 - 145 mmol/L 135  Potassium 3.5 - 5.1 mmol/L 4.2  Chloride 98 - 111 mmol/L 103  CO2 22 - 32 mmol/L 25  Glucose 70 - 99 mg/dL 895 (H)  BUN 6 - 20 mg/dL 21 (H)  Creatinine 9.55 - 1.00 mg/dL 9.10  Calcium  8.9 - 10.3 mg/dL 9.5  Anion gap 5 - 15  7  Alkaline Phosphatase 38 - 126 U/L 118  Albumin  3.5 - 5.0 g/dL 4.4  AST 15 - 41 U/L  16  ALT 0 - 44 U/L 22  Total Protein 6.5 - 8.1 g/dL 8.0  Total Bilirubin 0.0 - 1.2 mg/dL 0.4  GFR, Est Non African American >60 mL/min >60  (H): Data is abnormally high   TRAB <=2.00 IU/L 9.20 High     Thyroid  Ultrasound 01/15/2024   FINDINGS: Parenchymal Echotexture: Mildly heterogenous   Isthmus: 0.5 cm   Right lobe: 0.6 x 2.3 x 2.7 cm   Left lobe: 5.4 x 2.0 x 2.1 cm   _________________________________________________________   Estimated total number of nodules >/= 1 cm: 4   Number of spongiform nodules >/=  2 cm not described below (TR1): 0   Number of mixed cystic and solid nodules >/= 1.5 cm not described below (TR2): 0   _________________________________________________________   Nodule # 1: Slight interval decrease in size of the solid isoechoic nodule in the right upper gland which now measures 1.5 x 1.2 x 1.1 cm compared to 1.8 x 1.2 x 1.1 cm previously. TI-RADS category 3. *Given size (>/= 1.5 - 2.4 cm) and appearance, a follow-up ultrasound in 1 year should be considered based on TI-RADS criteria.   Nodule # 5: Hypoechoic solid nodule in the left mid gland is grossly stable at 1.3 x 1.3 x 1.1 cm compared to 1.4 x 1.1 x 1.1 cm previously. Findings remain consistent with TI-RADS category 4. *Given size (>/= 1 - 1.4 cm) and appearance, a follow-up ultrasound in 1 year should be considered based on TI-RADS criteria.   Multiple additional scattered small and sonographically low risk/benign nodules again noted in the right and left mid gland. As before, none criteria to recommend biopsy or dedicated imaging surveillance.   IMPRESSION: 1. Confirmed stability of nodule # 1 in the right upper and nodule # 5 in the left mid gland. This exam marks 1 year of stability. Both lesions continue to meet criteria for imaging surveillance. Recommend follow-up ultrasound in 1 year. 2. No new nodules or suspicious features.     ASSESSMENT/PLAN/RECOMMENDATIONS:    Hyperthyroidism:   -Patient is clinically euthyroid -No local neck symptoms -She has been on this dose for approximately 2 years, we have discussed by next visit switching to annual follow-ups as long as everything remains stable - TFTs *****   Medication Continue methimazole  5 mg, 1 tablet daily  2.  Graves' disease:  -No extrathyroidal manifestation of Graves' disease   3.  Multinodular goiter:  - No local symptoms - Up-to-date on thyroid  ultrasound   3.  Elevated alkaline phosphatase:   - This is not attributed to thionamides therapy as this has been elevated since 2019    Follow-up in 6 months   Signed electronically by: Stefano Redgie Butts, MD  [facility]  [facility] [address] [place], [place] [address] Phone: [phone] FAX: [phone]   CC: Burney Mackenzie CROME, MD [address] [place] [address] Phone: [phone] Fax: [phone]   Return to Endocrinology clinic as below: Future Appointments  Date Time Provider Department Center  07/28/2024  8:30 AM Jonel Rockie BIRCH, NP [facility] None  08/11/2024  9:00 AM Jude Lonell BRAVO, NP [facility] None  08/30/2024  9:00 AM Raford Riggs, MD [id number] [id number]  11/15/2024  9:00 AM [facility] MM 2 [facility]  12/14/2024  9:00 AM [facility] None  01/16/2025  9:00 AM Aden Berwyn LABOR, PTA [facility]  None       "

## 2024-07-20 ENCOUNTER — Encounter: Admitting: Dietician

## 2024-07-28 ENCOUNTER — Ambulatory Visit (INDEPENDENT_AMBULATORY_CARE_PROVIDER_SITE_OTHER): Admitting: Adult Health

## 2024-08-08 ENCOUNTER — Ambulatory Visit (INDEPENDENT_AMBULATORY_CARE_PROVIDER_SITE_OTHER): Admitting: Adult Health

## 2024-08-11 ENCOUNTER — Ambulatory Visit (INDEPENDENT_AMBULATORY_CARE_PROVIDER_SITE_OTHER): Admitting: Nurse Practitioner

## 2024-08-30 ENCOUNTER — Ambulatory Visit (HOSPITAL_BASED_OUTPATIENT_CLINIC_OR_DEPARTMENT_OTHER): Admitting: Cardiovascular Disease

## 2024-11-14 ENCOUNTER — Ambulatory Visit

## 2024-11-15 ENCOUNTER — Ambulatory Visit

## 2024-12-14 ENCOUNTER — Inpatient Hospital Stay

## 2025-01-09 ENCOUNTER — Ambulatory Visit: Admitting: Internal Medicine

## 2025-01-16 ENCOUNTER — Ambulatory Visit
